# Patient Record
Sex: Female | Born: 1935 | Race: White | Hispanic: No | Smoking: Former smoker
Health system: Southern US, Community
[De-identification: ages and names within clinical notes are randomized; demographics above are authoritative.]

## PROBLEM LIST (undated history)

## (undated) DIAGNOSIS — K648 Other hemorrhoids: Secondary | ICD-10-CM

## (undated) DIAGNOSIS — F32A Depression, unspecified: Secondary | ICD-10-CM

## (undated) DIAGNOSIS — R519 Headache, unspecified: Secondary | ICD-10-CM

## (undated) DIAGNOSIS — F419 Anxiety disorder, unspecified: Secondary | ICD-10-CM

## (undated) DIAGNOSIS — E079 Disorder of thyroid, unspecified: Secondary | ICD-10-CM

## (undated) DIAGNOSIS — M109 Gout, unspecified: Secondary | ICD-10-CM

## (undated) DIAGNOSIS — M1990 Unspecified osteoarthritis, unspecified site: Secondary | ICD-10-CM

## (undated) DIAGNOSIS — N189 Chronic kidney disease, unspecified: Secondary | ICD-10-CM

## (undated) DIAGNOSIS — R202 Paresthesia of skin: Secondary | ICD-10-CM

## (undated) DIAGNOSIS — I739 Peripheral vascular disease, unspecified: Secondary | ICD-10-CM

## (undated) DIAGNOSIS — Z9889 Other specified postprocedural states: Secondary | ICD-10-CM

## (undated) DIAGNOSIS — R1310 Dysphagia, unspecified: Secondary | ICD-10-CM

## (undated) DIAGNOSIS — H269 Unspecified cataract: Secondary | ICD-10-CM

## (undated) DIAGNOSIS — I10 Essential (primary) hypertension: Secondary | ICD-10-CM

## (undated) DIAGNOSIS — I5030 Unspecified diastolic (congestive) heart failure: Secondary | ICD-10-CM

## (undated) DIAGNOSIS — I2510 Atherosclerotic heart disease of native coronary artery without angina pectoris: Secondary | ICD-10-CM

## (undated) DIAGNOSIS — Z862 Personal history of diseases of the blood and blood-forming organs and certain disorders involving the immune mechanism: Secondary | ICD-10-CM

## (undated) DIAGNOSIS — I499 Cardiac arrhythmia, unspecified: Secondary | ICD-10-CM

## (undated) DIAGNOSIS — I4891 Unspecified atrial fibrillation: Secondary | ICD-10-CM

## (undated) DIAGNOSIS — I219 Acute myocardial infarction, unspecified: Secondary | ICD-10-CM

## (undated) DIAGNOSIS — G4730 Sleep apnea, unspecified: Secondary | ICD-10-CM

## (undated) DIAGNOSIS — M719 Bursopathy, unspecified: Secondary | ICD-10-CM

## (undated) DIAGNOSIS — R51 Headache: Secondary | ICD-10-CM

## (undated) DIAGNOSIS — J189 Pneumonia, unspecified organism: Secondary | ICD-10-CM

## (undated) DIAGNOSIS — F329 Major depressive disorder, single episode, unspecified: Secondary | ICD-10-CM

## (undated) DIAGNOSIS — R0600 Dyspnea, unspecified: Secondary | ICD-10-CM

## (undated) DIAGNOSIS — F410 Panic disorder [episodic paroxysmal anxiety] without agoraphobia: Secondary | ICD-10-CM

## (undated) DIAGNOSIS — I509 Heart failure, unspecified: Secondary | ICD-10-CM

## (undated) DIAGNOSIS — E785 Hyperlipidemia, unspecified: Secondary | ICD-10-CM

## (undated) DIAGNOSIS — J449 Chronic obstructive pulmonary disease, unspecified: Secondary | ICD-10-CM

## (undated) DIAGNOSIS — K52839 Microscopic colitis, unspecified: Secondary | ICD-10-CM

## (undated) DIAGNOSIS — K219 Gastro-esophageal reflux disease without esophagitis: Secondary | ICD-10-CM

## (undated) HISTORY — DX: Chronic obstructive pulmonary disease, unspecified: J44.9

## (undated) HISTORY — PX: OTHER SURGICAL HISTORY: SHX169

## (undated) HISTORY — DX: Bursopathy, unspecified: M71.9

## (undated) HISTORY — DX: Gastro-esophageal reflux disease without esophagitis: K21.9

## (undated) HISTORY — DX: Microscopic colitis, unspecified: K52.839

## (undated) HISTORY — DX: Chronic kidney disease, unspecified: N18.9

## (undated) HISTORY — PX: SHOULDER SURGERY: SHX246

## (undated) HISTORY — PX: ABDOMINAL HYSTERECTOMY: SHX81

## (undated) HISTORY — DX: Hyperlipidemia, unspecified: E78.5

## (undated) HISTORY — DX: Acute myocardial infarction, unspecified: I21.9

## (undated) HISTORY — PX: CARDIAC CATHETERIZATION: SHX172

## (undated) HISTORY — DX: Other hemorrhoids: K64.8

## (undated) HISTORY — PX: ANTERIOR AND POSTERIOR REPAIR: SHX1172

## (undated) HISTORY — DX: Atherosclerotic heart disease of native coronary artery without angina pectoris: I25.10

## (undated) HISTORY — PX: NASAL SINUS SURGERY: SHX719

## (undated) HISTORY — DX: Panic disorder (episodic paroxysmal anxiety): F41.0

## (undated) HISTORY — DX: Disorder of thyroid, unspecified: E07.9

## (undated) HISTORY — DX: Peripheral vascular disease, unspecified: I73.9

## (undated) HISTORY — PX: CORONARY ANGIOPLASTY WITH STENT PLACEMENT: SHX49

## (undated) HISTORY — DX: Dysphagia, unspecified: R13.10

## (undated) HISTORY — DX: Heart failure, unspecified: I50.9

## (undated) HISTORY — DX: Other specified postprocedural states: Z98.890

## (undated) HISTORY — PX: BACK SURGERY: SHX140

## (undated) HISTORY — PX: APPENDECTOMY: SHX54

## (undated) HISTORY — PX: TONSILLECTOMY: SHX5217

---

## 1970-04-17 HISTORY — PX: OTHER SURGICAL HISTORY: SHX169

## 1987-04-18 HISTORY — PX: CARPAL TUNNEL RELEASE: SHX101

## 1996-04-17 HISTORY — PX: BREAST LUMPECTOMY: SHX2

## 1996-04-17 HISTORY — PX: CHOLECYSTECTOMY: SHX55

## 2001-04-17 DIAGNOSIS — K52839 Microscopic colitis, unspecified: Secondary | ICD-10-CM

## 2001-04-17 HISTORY — DX: Microscopic colitis, unspecified: K52.839

## 2001-09-27 ENCOUNTER — Ambulatory Visit (HOSPITAL_COMMUNITY): Admission: RE | Admit: 2001-09-27 | Discharge: 2001-09-27 | Payer: Self-pay | Admitting: Internal Medicine

## 2001-09-27 DIAGNOSIS — Z9889 Other specified postprocedural states: Secondary | ICD-10-CM

## 2001-09-27 HISTORY — DX: Other specified postprocedural states: Z98.890

## 2003-01-27 ENCOUNTER — Ambulatory Visit (HOSPITAL_COMMUNITY): Admission: RE | Admit: 2003-01-27 | Discharge: 2003-01-27 | Payer: Self-pay | Admitting: Internal Medicine

## 2004-01-06 ENCOUNTER — Ambulatory Visit (HOSPITAL_COMMUNITY): Admission: RE | Admit: 2004-01-06 | Discharge: 2004-01-06 | Payer: Self-pay | Admitting: Internal Medicine

## 2004-04-17 DIAGNOSIS — I219 Acute myocardial infarction, unspecified: Secondary | ICD-10-CM

## 2004-04-17 HISTORY — DX: Acute myocardial infarction, unspecified: I21.9

## 2004-08-18 ENCOUNTER — Ambulatory Visit: Payer: Self-pay | Admitting: Cardiology

## 2004-08-19 ENCOUNTER — Ambulatory Visit (HOSPITAL_COMMUNITY): Admission: RE | Admit: 2004-08-19 | Discharge: 2004-08-19 | Payer: Self-pay | Admitting: *Deleted

## 2004-08-19 ENCOUNTER — Ambulatory Visit: Payer: Self-pay | Admitting: Internal Medicine

## 2004-09-01 ENCOUNTER — Ambulatory Visit: Payer: Self-pay | Admitting: Cardiology

## 2004-10-05 ENCOUNTER — Ambulatory Visit: Payer: Self-pay | Admitting: Cardiology

## 2004-10-11 ENCOUNTER — Ambulatory Visit: Payer: Self-pay | Admitting: Cardiology

## 2005-02-01 ENCOUNTER — Ambulatory Visit: Payer: Self-pay | Admitting: Cardiology

## 2005-04-17 HISTORY — PX: JOINT REPLACEMENT: SHX530

## 2005-04-17 HISTORY — PX: OTHER SURGICAL HISTORY: SHX169

## 2005-08-28 ENCOUNTER — Ambulatory Visit: Payer: Self-pay | Admitting: Cardiology

## 2005-09-01 ENCOUNTER — Ambulatory Visit: Payer: Self-pay | Admitting: Internal Medicine

## 2005-09-01 ENCOUNTER — Inpatient Hospital Stay (HOSPITAL_BASED_OUTPATIENT_CLINIC_OR_DEPARTMENT_OTHER): Admission: RE | Admit: 2005-09-01 | Discharge: 2005-09-01 | Payer: Self-pay | Admitting: Internal Medicine

## 2005-10-27 ENCOUNTER — Ambulatory Visit: Payer: Self-pay | Admitting: Cardiology

## 2005-10-30 ENCOUNTER — Ambulatory Visit: Payer: Self-pay | Admitting: Cardiology

## 2006-02-23 ENCOUNTER — Ambulatory Visit: Payer: Self-pay | Admitting: Cardiology

## 2006-03-27 ENCOUNTER — Ambulatory Visit: Payer: Self-pay | Admitting: Cardiology

## 2006-03-28 ENCOUNTER — Inpatient Hospital Stay (HOSPITAL_COMMUNITY): Admission: RE | Admit: 2006-03-28 | Discharge: 2006-04-01 | Payer: Self-pay | Admitting: Orthopedic Surgery

## 2006-08-03 ENCOUNTER — Ambulatory Visit: Payer: Self-pay | Admitting: Cardiology

## 2006-08-16 ENCOUNTER — Ambulatory Visit: Payer: Self-pay | Admitting: Cardiology

## 2006-09-19 ENCOUNTER — Ambulatory Visit: Payer: Self-pay | Admitting: Cardiology

## 2006-09-21 ENCOUNTER — Ambulatory Visit: Payer: Self-pay | Admitting: Cardiology

## 2006-09-26 ENCOUNTER — Ambulatory Visit (HOSPITAL_COMMUNITY): Admission: RE | Admit: 2006-09-26 | Discharge: 2006-09-26 | Payer: Self-pay | Admitting: *Deleted

## 2007-01-15 ENCOUNTER — Ambulatory Visit: Payer: Self-pay | Admitting: Internal Medicine

## 2007-01-16 ENCOUNTER — Ambulatory Visit (HOSPITAL_COMMUNITY): Admission: RE | Admit: 2007-01-16 | Discharge: 2007-01-16 | Payer: Self-pay | Admitting: Gastroenterology

## 2007-01-25 ENCOUNTER — Ambulatory Visit: Payer: Self-pay | Admitting: Cardiology

## 2007-01-25 ENCOUNTER — Inpatient Hospital Stay (HOSPITAL_COMMUNITY): Admission: AD | Admit: 2007-01-25 | Discharge: 2007-01-29 | Payer: Self-pay | Admitting: Cardiology

## 2007-02-15 ENCOUNTER — Ambulatory Visit: Payer: Self-pay | Admitting: Cardiology

## 2007-05-14 ENCOUNTER — Ambulatory Visit: Payer: Self-pay | Admitting: Cardiology

## 2007-06-19 ENCOUNTER — Ambulatory Visit: Payer: Self-pay | Admitting: Internal Medicine

## 2007-08-30 ENCOUNTER — Ambulatory Visit: Payer: Self-pay | Admitting: Cardiology

## 2007-12-03 ENCOUNTER — Encounter: Payer: Self-pay | Admitting: Cardiology

## 2007-12-12 ENCOUNTER — Ambulatory Visit: Payer: Self-pay | Admitting: Cardiology

## 2008-02-28 DIAGNOSIS — K219 Gastro-esophageal reflux disease without esophagitis: Secondary | ICD-10-CM | POA: Insufficient documentation

## 2008-02-28 DIAGNOSIS — I219 Acute myocardial infarction, unspecified: Secondary | ICD-10-CM | POA: Insufficient documentation

## 2008-02-28 DIAGNOSIS — F410 Panic disorder [episodic paroxysmal anxiety] without agoraphobia: Secondary | ICD-10-CM | POA: Insufficient documentation

## 2008-02-28 DIAGNOSIS — K5289 Other specified noninfective gastroenteritis and colitis: Secondary | ICD-10-CM | POA: Insufficient documentation

## 2008-02-28 DIAGNOSIS — Z8679 Personal history of other diseases of the circulatory system: Secondary | ICD-10-CM

## 2008-02-28 DIAGNOSIS — K648 Other hemorrhoids: Secondary | ICD-10-CM

## 2008-02-28 DIAGNOSIS — R1310 Dysphagia, unspecified: Secondary | ICD-10-CM

## 2008-10-13 ENCOUNTER — Encounter: Payer: Self-pay | Admitting: Cardiology

## 2008-10-13 ENCOUNTER — Encounter: Payer: Self-pay | Admitting: Physician Assistant

## 2008-10-13 ENCOUNTER — Ambulatory Visit: Payer: Self-pay | Admitting: Cardiology

## 2008-10-13 ENCOUNTER — Inpatient Hospital Stay (HOSPITAL_COMMUNITY): Admission: EM | Admit: 2008-10-13 | Discharge: 2008-10-16 | Payer: Self-pay | Admitting: Cardiology

## 2008-10-13 ENCOUNTER — Ambulatory Visit: Payer: Self-pay | Admitting: Cardiovascular Disease

## 2008-10-14 ENCOUNTER — Encounter: Payer: Self-pay | Admitting: Cardiology

## 2008-10-15 ENCOUNTER — Encounter: Payer: Self-pay | Admitting: Cardiology

## 2008-10-16 ENCOUNTER — Encounter: Payer: Self-pay | Admitting: Cardiovascular Disease

## 2008-11-27 DIAGNOSIS — I2510 Atherosclerotic heart disease of native coronary artery without angina pectoris: Secondary | ICD-10-CM | POA: Insufficient documentation

## 2008-11-27 DIAGNOSIS — E785 Hyperlipidemia, unspecified: Secondary | ICD-10-CM

## 2008-11-27 DIAGNOSIS — I739 Peripheral vascular disease, unspecified: Secondary | ICD-10-CM

## 2008-11-30 ENCOUNTER — Ambulatory Visit: Payer: Self-pay | Admitting: Cardiology

## 2008-11-30 ENCOUNTER — Encounter: Payer: Self-pay | Admitting: Cardiology

## 2008-11-30 DIAGNOSIS — I5032 Chronic diastolic (congestive) heart failure: Secondary | ICD-10-CM

## 2008-11-30 DIAGNOSIS — R0602 Shortness of breath: Secondary | ICD-10-CM | POA: Insufficient documentation

## 2008-12-08 ENCOUNTER — Encounter: Payer: Self-pay | Admitting: Cardiology

## 2008-12-16 ENCOUNTER — Telehealth: Payer: Self-pay | Admitting: Cardiology

## 2008-12-16 ENCOUNTER — Encounter (INDEPENDENT_AMBULATORY_CARE_PROVIDER_SITE_OTHER): Payer: Self-pay | Admitting: *Deleted

## 2009-04-13 ENCOUNTER — Ambulatory Visit: Payer: Self-pay | Admitting: Cardiology

## 2009-04-13 DIAGNOSIS — R5383 Other fatigue: Secondary | ICD-10-CM

## 2009-04-13 DIAGNOSIS — R5381 Other malaise: Secondary | ICD-10-CM | POA: Insufficient documentation

## 2009-05-17 ENCOUNTER — Ambulatory Visit: Payer: Self-pay | Admitting: Cardiology

## 2009-05-17 ENCOUNTER — Encounter: Payer: Self-pay | Admitting: Physician Assistant

## 2009-05-26 ENCOUNTER — Encounter: Payer: Self-pay | Admitting: Cardiology

## 2009-06-04 ENCOUNTER — Encounter: Payer: Self-pay | Admitting: Cardiology

## 2009-06-07 ENCOUNTER — Telehealth: Payer: Self-pay | Admitting: Cardiology

## 2009-06-13 ENCOUNTER — Encounter: Payer: Self-pay | Admitting: Cardiology

## 2009-07-14 ENCOUNTER — Encounter: Payer: Self-pay | Admitting: Cardiology

## 2009-09-16 ENCOUNTER — Encounter: Payer: Self-pay | Admitting: Cardiology

## 2010-01-06 ENCOUNTER — Ambulatory Visit: Payer: Self-pay | Admitting: Cardiology

## 2010-05-17 NOTE — Assessment & Plan Note (Signed)
Summary: 41monthfollowup/rm  Medications Added TRAMADOL HCL 50 MG TABS (TRAMADOL HCL) Take 1 tablet by mouth two times a day as needed VITAMIN E 400 UNIT CAPS (VITAMIN E) Take 1 capsule by mouth once a day CENTRUM  TABS (MULTIPLE VITAMINS-MINERALS) Take 1 tablet by mouth once a day OXYBUTYNIN CHLORIDE 5 MG TABS (OXYBUTYNIN CHLORIDE) Take 1 tablet by mouth two times a day ALPRAZOLAM 0.25 MG TABS (ALPRAZOLAM) may take one tab two times a day to three times a day as needed anxiety        Visit Type:  Follow-up Primary Provider:  Dr. Consuello Masse  CC:  SOB and Diaphoresis.  History of Present Illness: the patient is a 77 year old female with history of nonobstructive coronary artery disease documented by multiple prior cardiac catheterizations.  She has normal LV systolic function.  She has a history of chronic dyspnea and has been evaluated by Dr. Koleen Nimrod.  She apparently has COPD.  She underwent a methacholine challenge test which was negative.  FEV1 was 1.85 L.  The patient complains of occasional left-sided pains associated with cold sweats and a feeling of panic.  She sometimes gets shaky.  Xanax he is to improve her symptoms.  She also reports right hand and right elbow as well as right shoulder pain with associated numbness.  There is no exertional chest pain however that she has exertional dyspnea.  EKG demonstrates no acute ischemia.  Preventive Screening-Counseling & Management  Alcohol-Tobacco     Smoking Status: quit  Comments: quit smoking 8 yrs ago. Started smoking at 77 yrs old  Current Medications (verified): 1)  Metoprolol Tartrate 50 Mg Tabs (Metoprolol Tartrate) .... Take 1/4 Tablet By Mouth Twice A Day 2)  Imdur 120 Mg Xr24h-Tab (Isosorbide Mononitrate) .... Take 1 Tablets By Mouth Once A Day 3)  Protonix 40 Mg Tbec (Pantoprazole Sodium) .... Take 1 Tablet By Mouth Once A Day 4)  Nitroglycerin 0.4 Mg Subl (Nitroglycerin) .... One Tablet Under Tongue Every 5 Minutes As  Needed For Chest Pain---May Repeat Times Three 5)  Cymbalta 60 Mg Cpep (Duloxetine Hcl) .... Once Daily 6)  Aspirin 81 Mg Tbec (Aspirin) .... Take One Tablet By Mouth Daily 7)  Cranberry Plus Vitamin C 140-100-3 Mg-Mg-Unit Caps (Cranberry-Vitamin C-Vitamin E) .... 2 Once Daily 8)  Fish Oil 1000 Mg Caps (Omega-3 Fatty Acids) .... Take 1 Tablet By Mouth Once A Day 9)  Furosemide 20 Mg Tabs (Furosemide) .... Take  1 Tablet By Mouth Daily. 10)  Imodium A-D 2 Mg Tabs (Loperamide Hcl) .... As Needed 11)  Tramadol Hcl 50 Mg Tabs (Tramadol Hcl) .... Take 1 Tablet By Mouth Two Times A Day As Needed 12)  Fluticasone Propionate 50 Mcg/act Susp (Fluticasone Propionate) .... 2 (19mcg) Puffs Two Times A Day 13)  Vitamin E 400 Unit Caps (Vitamin E) .... Take 1 Capsule By Mouth Once A Day 14)  Centrum  Tabs (Multiple Vitamins-Minerals) .... Take 1 Tablet By Mouth Once A Day 15)  Oxybutynin Chloride 5 Mg Tabs (Oxybutynin Chloride) .... Take 1 Tablet By Mouth Two Times A Day 16)  Alprazolam 0.25 Mg Tabs (Alprazolam) .... May Take One Tab Two Times A Day To Three Times A Day As Needed Anxiety  Allergies: 1)  ! Floxin 2)  ! Codeine 3)  ! Prednisone  Comments:  Nurse/Medical Assistant: The patient's medications were reviewed with the patient and were updated in the Medication List. Pt brought medication bottles to office visit.  Gurney Maxin, RN, BSN (January 06, 2010 10:33 AM)  Past History:  Past Medical History: PVD (ICD-443.9) HYPERLIPIDEMIA-MIXED (ICD-272.4) CAD, NATIVE VESSEL (ICD-414.01)nonobstructive coronary artery disease by multiple catheterizations. PANIC ATTACK (ICD-300.01) HYPERTENSION, HX OF (ICD-V12.50) MI (ICD-410.90) HEMORRHOIDS, INTERNAL (ICD-455.0) DYSPHAGIA UNSPECIFIED (ICD-787.20) COLITIS (ICD-558.9) GASTROESOPHAGEAL REFLUX DISEASE, CHRONIC (ICD-530.81) negative methacholine challenge test.  FEV1 1.85 L    Review of Systems       The patient complains of chest pain  and shortness of breath.  The patient denies fatigue, malaise, fever, weight gain/loss, vision loss, decreased hearing, hoarseness, palpitations, prolonged cough, wheezing, sleep apnea, coughing up blood, abdominal pain, blood in stool, nausea, vomiting, diarrhea, heartburn, incontinence, blood in urine, muscle weakness, joint pain, leg swelling, rash, skin lesions, headache, fainting, dizziness, depression, anxiety, enlarged lymph nodes, easy bruising or bleeding, and environmental allergies.    Vital Signs:  Patient profile:   77 year old female Height:      61 inches Weight:      177.50 pounds BMI:     33.66 Pulse rate:   64 / minute BP sitting:   128 / 75  (left arm) Cuff size:   large  Vitals Entered By: Gurney Maxin, RN, BSN (January 06, 2010 10:24 AM)  Nutrition Counseling: Patient's BMI is greater than 25 and therefore counseled on weight management options. CC: SOB and Diaphoresis Comments Pt states SOB and sweating for over 1 yr. She has seen Dr. Koleen Nimrod for a test for SOB. Pt states she stopped potassium own her own d/t upset stomach. She states when she gets the cramps she eats something with potassium in it that her stomach will take.   Physical Exam  Additional Exam:  General: Well-developed, well-nourished in no distress head: Normocephalic and atraumatic eyes PERRLA/EOMI intact, conjunctiva and lids normal nose: No deformity or lesions mouth normal dentition, normal posterior pharynx neck: Supple, no JVD.  No masses, thyromegaly or abnormal cervical nodes lungs: Normal breath sounds bilaterally without wheezing.  Normal percussion heart: regular rate and rhythm with normal S1 and S2, no S3 or S4.  PMI is normal.  No pathological murmurs abdomen: Normal bowel sounds, abdomen is soft and nontender without masses, organomegaly or hernias noted.  No hepatosplenomegaly musculoskeletal: Back normal, normal gait muscle strength and tone normal pulsus: Pulse is normal in  all 4 extremities Extremities: No peripheral pitting edema neurologic: Alert and oriented x 3 skin: Intact without lesions or rashes cervical nodes: No significant adenopathy psychologic: Normal affect    EKG  Procedure date:  01/06/2010  Findings:      normal sinus rhythm with short PR.  Low-voltage QRS.  Septal infarct pattern.  Heart rate 63 beats/min  Impression & Recommendations:  Problem # 1:  CAD, NATIVE VESSEL (ICD-414.01) atypical chest pain with nonobstructive coronary artery disease.  Continue medical therapy.  Suspect a large component of the patient's chest pain may be mediated by anxiety Her updated medication list for this problem includes:    Metoprolol Tartrate 50 Mg Tabs (Metoprolol tartrate) .[name] Kitchen... Take 1/4 tablet by mouth twice a day    Imdur 120 Mg Xr24h-tab (Isosorbide mononitrate) .[name] Kitchen... Take 1 tablets by mouth once a day    Nitroglycerin 0.4 Mg Subl (Nitroglycerin) ..... One tablet under tongue every 5 minutes as needed for chest pain---may repeat times three    Aspirin 81 Mg Tbec (Aspirin) .[name] Kitchen... Take one tablet by mouth daily  Orders: EKG w/ Interpretation (93000)  Problem # 2:  CHRONIC DIASTOLIC HEART FAILURE (0000000) no evidence of volume overload.  Continue medical therapy Her updated medication list for this problem includes:    Metoprolol Tartrate 50 Mg Tabs (Metoprolol tartrate) .[name] Kitchen... Take 1/4 tablet by mouth twice a day    Imdur 120 Mg Xr24h-tab (Isosorbide mononitrate) .[name] Kitchen... Take 1 tablets by mouth once a day    Nitroglycerin 0.4 Mg Subl (Nitroglycerin) ..... One tablet under tongue every 5 minutes as needed for chest pain---may repeat times three    Aspirin 81 Mg Tbec (Aspirin) .[name] Kitchen... Take one tablet by mouth daily    Furosemide 20 Mg Tabs (Furosemide) .[name] Kitchen... Take  1 tablet by mouth daily.  Problem # 3:  DYSPNEA (ICD-786.05) likely secondary to COPD, but negative methacholine challenge test.  Now followed by Dr. Koleen Nimrod. Her updated  medication list for this problem includes:    Metoprolol Tartrate 50 Mg Tabs (Metoprolol tartrate) .[name] Kitchen... Take 1/4 tablet by mouth twice a day    Aspirin 81 Mg Tbec (Aspirin) .[name] Kitchen... Take one tablet by mouth daily    Furosemide 20 Mg Tabs (Furosemide) .[name] Kitchen... Take  1 tablet by mouth daily.  Patient Instructions: 1)  Xanax 0.25mg  two times a day to three times a day as needed anxiety (30 days only, no refills) 2)  Follow up in  6 months Prescriptions: ALPRAZOLAM 0.25 MG TABS (ALPRAZOLAM) may take one tab two times a day to three times a day as needed anxiety  #30 x 0   Entered by:   Lovina Reach, LPN   Authorized by:   Terald Sleeper, MD, [facility]   Signed by:   Lovina Reach, LPN on [id number]   Method used:   Print then Give to Patient   RxID:   [id number]

## 2010-05-17 NOTE — Letter (Signed)
Summary: External Correspondence/ FAXED DR. HENDERSON  External Correspondence/ FAXED DR. HENDERSON   Imported By: Bartholomew Boards 06/18/2009 12:16:43  _____________________________________________________________________  External Attachment:    Type:   Image     Comment:   External Document

## 2010-05-17 NOTE — Miscellaneous (Signed)
Summary: Orders Update - Pulmonary Referral  Clinical Lists Changes  Orders: Added new Referral order of Misc. Referral (Misc. Ref) - Signed

## 2010-05-17 NOTE — Letter (Signed)
Summary: External Correspondence/ NOTE DR. HENDERSON  External Correspondence/ NOTE DR. HENDERSON   Imported By: Bartholomew Boards 07/06/2009 12:23:03  _____________________________________________________________________  External Attachment:    Type:   Image     Comment:   External Document

## 2010-05-17 NOTE — Assessment & Plan Note (Signed)
Summary: 1 MO FU REMINDER-SRS  Medications Added FLUTICASONE PROPIONATE 50 MCG/ACT SUSP (FLUTICASONE PROPIONATE) 2 (147mcg) puffs two times a day      Allergies Added:   Visit Type:  Follow-up Primary Provider:  Dr. Consuello Masse  CC:  follow-up visit.  History of Present Illness: the patient is a 77 year old female with history of nonobstructive coronary arteries by multiple prior cardiac catheterizations. The patient catheterization less than a year ago with normal LV function and postcatheterization perfusion imaging was also negative for ischemia. The patient continues to complain about dyspnea at rest and exertional dyspnea. He is also felt to have COPD and she reports at times wheezing. She reports swelling of the hands and has a possible diagnosis of chronic diastolic heart failure. However her last BNP level was within normal limits. The patient reports problems with anxiety. She has been unable to lose weight. She denies any popping PND palpitations or syncope.  Clinical Review Panels:  CXR CXR results Heart and mediastinal contours are within normal limits.         No focal opacities or effusions.  No acute bony abnormality.                   IMPRESSION:         No active disease. (10/13/2008)  Cardiac Imaging Cardiac Cath Findings  1.  Stable nonobstructive coronary disease without flow-limiting of lesions.      There is a moderate lesion in the proximal LAD but this is unchanged      since [id number].  2.  Low normal LV ejection fraction.  3.  Moderate peripheral arterial disease as described above.  4.  Significant discordance between the thermodilution and the Fick cardiac      output.  I suspect the thermodilution is probably more accurate; as I      suspect the oxygen consumption in the Fick equation may be off.  5.  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   PLAN:  At this point we will continue medical therapy.  If her chest pain  continues  would consider an adenosine Myoview to further evaluate the  hemodynamic significance of LAD lesion.   Glori Bickers, M.D. [facility] (09/01/2005)    Preventive Screening-Counseling & Management  Alcohol-Tobacco     Smoking Status: quit     Year Quit: 2004  Current Medications (verified): 1)  Metoprolol Tartrate 50 Mg Tabs (Metoprolol Tartrate) .... Take 1/4 Tablet By Mouth Twice A Day 2)  Imdur 120 Mg Xr24h-Tab (Isosorbide Mononitrate) .... Take 1 Tablets By Mouth Once A Day 3)  Protonix 40 Mg Tbec (Pantoprazole Sodium) .... Take 1 Tablet By Mouth Once A Day 4)  Nitroglycerin 0.4 Mg Subl (Nitroglycerin) .... One Tablet Under Tongue Every 5 Minutes As Needed For Chest Pain---May Repeat Times Three 5)  Cymbalta 60 Mg Cpep (Duloxetine Hcl) .... Once Daily 6)  Aspirin 81 Mg Tbec (Aspirin) .... Take One Tablet By Mouth Daily 7)  Calcium Carbonate-Vitamin D 600-400 Mg-Unit  Tabs (Calcium Carbonate-Vitamin D) .... Once Daily 8)  Cranberry Plus Vitamin C 140-100-3 Mg-Mg-Unit Caps (Cranberry-Vitamin C-Vitamin E) .... 2 Once Daily 9)  Fish Oil 1000 Mg Caps (Omega-3 Fatty Acids) .... Take 1 Tablet By Mouth Once A Day 10)  Furosemide 20 Mg Tabs (Furosemide) .... Take  1 Tablet By Mouth Daily. 11)  Potassium Chloride Crys Cr 20 Meq Cr-Tabs (Potassium Chloride Crys Cr) .... Take  1 Tab (44meq) Daily 12)  Imodium A-D  2 Mg Tabs (Loperamide Hcl) .... As Needed 13)  Tramadol Hcl 50 Mg Tabs (Tramadol Hcl) .... Take 1 Tablet By Mouth Two Times A Day 14)  Probiotic  Caps (Probiotic Product) .... Take 1 Tablet By Mouth Once A Day 15)  Amoxicillin 500 Mg Caps (Amoxicillin) .... Take 1 Tablet By Mouth Two Times A Day For 0 Days 16)  Fluticasone Propionate 50 Mcg/act Susp (Fluticasone Propionate) .... 2 (133mcg) Puffs Two Times A Day  Allergies (verified): 1)  ! Floxin 2)  ! Codeine 3)  ! Prednisone  Comments:  Nurse/Medical Assistant: The patient's medications and allergies were reviewed with the patient  and were updated in the Medication and Allergy Lists. Bottles reviewed.  Past History:  Past Medical History: Last updated: 11/27/2008 PVD (ICD-443.9) HYPERLIPIDEMIA-MIXED (ICD-272.4) CAD, NATIVE VESSEL (ICD-414.01) PANIC ATTACK (ICD-300.01) HYPERTENSION, HX OF (ICD-V12.50) MI (ICD-410.90) HEMORRHOIDS, INTERNAL (ICD-455.0) DYSPHAGIA UNSPECIFIED (ICD-787.20) COLITIS (ICD-558.9) GASTROESOPHAGEAL REFLUX DISEASE, CHRONIC (ICD-530.81)    Past Surgical History: Last updated: 02/28/2008 Tonsilectomy Hysterectomy Cholecystectomy Right knee replacement Right leg benign tumor Left breast ( lumpectomy) Left hand surgery Left rotator cuff Sinus surgery  Family History: Last updated: 11/27/2008 Family History of Coronary Artery Disease:  Family History of CVA or Stroke:  Family History of Hypertension:   Social History: Last updated: 11/27/2008 Full Time Divorced  Tobacco Use - No.  Alcohol Use - no  Risk Factors: Smoking Status: quit (05/17/2009)  Review of Systems       The patient complains of weight gain/loss, shortness of breath, and anxiety.  The patient denies fatigue, malaise, fever, vision loss, decreased hearing, hoarseness, chest pain, palpitations, prolonged cough, wheezing, sleep apnea, coughing up blood, abdominal pain, blood in stool, nausea, vomiting, diarrhea, heartburn, incontinence, blood in urine, muscle weakness, joint pain, leg swelling, rash, skin lesions, headache, fainting, dizziness, depression, enlarged lymph nodes, easy bruising or bleeding, and environmental allergies.    Vital Signs:  Patient profile:   77 year old female Height:      61 inches Weight:      186 pounds Pulse rate:   56 / minute BP sitting:   145 / 76  (left arm) Cuff size:   large  Vitals Entered By: Georgina Peer (May 17, 2009 1:15 PM) CC: follow-up visit   Physical Exam  Additional Exam:  General: Well-developed, well-nourished in no distress head: Normocephalic  and atraumatic eyes PERRLA/EOMI intact, conjunctiva and lids normal nose: No deformity or lesions mouth normal dentition, normal posterior pharynx neck: Supple, no JVD.  No masses, thyromegaly or abnormal cervical nodes lungs: Normal breath sounds bilaterally without wheezing.  Normal percussion heart: regular rate and rhythm with normal S1 and S2, no S3 or S4.  PMI is normal.  No pathological murmurs abdomen: Normal bowel sounds, abdomen is soft and nontender without masses, organomegaly or hernias noted.  No hepatosplenomegaly musculoskeletal: Back normal, normal gait muscle strength and tone normal pulsus: Pulse is normal in all 4 extremities Extremities: No peripheral pitting edema neurologic: Alert and oriented x 3 skin: Intact without lesions or rashes cervical nodes: No significant adenopathy psychologic: Normal affect    Impression & Recommendations:  Problem # 1:  DYSPNEA (ICD-786.05) the patient has multifactorial dyspnea. There is no definite evidence of acute diastolic heart failure. Her BNP level was previously normal. Make no changes in her diuretic regimen. I did order pulmonary function test with DLCO as well as an ABG.I also started patient on fluticasone inhaler at 100 micrograms b.i.d. Her updated medication list  for this problem includes:    Metoprolol Tartrate 50 Mg Tabs (Metoprolol tartrate) .[name] Kitchen... Take 1/4 tablet by mouth twice a day    Aspirin 81 Mg Tbec (Aspirin) .[name] Kitchen... Take one tablet by mouth daily    Furosemide 20 Mg Tabs (Furosemide) .[name] Kitchen... Take  1 tablet by mouth daily.  Orders: Pulmonary Function Test (PFT)  Problem # 2:  CHRONIC DIASTOLIC HEART FAILURE (0000000) no evidence of volume overload. As outlined above, BNP level was within normal limits her last office visit. Her updated medication list for this problem includes:    Metoprolol Tartrate 50 Mg Tabs (Metoprolol tartrate) .[name] Kitchen... Take 1/4 tablet by mouth twice a day    Imdur 120 Mg Xr24h-tab  (Isosorbide mononitrate) .[name] Kitchen... Take 1 tablets by mouth once a day    Nitroglycerin 0.4 Mg Subl (Nitroglycerin) ..... One tablet under tongue every 5 minutes as needed for chest pain---may repeat times three    Aspirin 81 Mg Tbec (Aspirin) .[name] Kitchen... Take one tablet by mouth daily    Furosemide 20 Mg Tabs (Furosemide) .[name] Kitchen... Take  1 tablet by mouth daily.  Problem # 3:  HYPERLIPIDEMIA-MIXED (B2193296.4) lipid panel will be followed by her primary care physician.  Problem # 4:  CAD, NATIVE VESSEL (ICD-414.01) the patient reports no chest pain. The catheterization was only approximately 8 months ago and was followed by a normal perfusion study. She has stable nonobstructive coronary artery disease. Her updated medication list for this problem includes:    Metoprolol Tartrate 50 Mg Tabs (Metoprolol tartrate) .[name] Kitchen... Take 1/4 tablet by mouth twice a day    Imdur 120 Mg Xr24h-tab (Isosorbide mononitrate) .[name] Kitchen... Take 1 tablets by mouth once a day    Nitroglycerin 0.4 Mg Subl (Nitroglycerin) ..... One tablet under tongue every 5 minutes as needed for chest pain---may repeat times three    Aspirin 81 Mg Tbec (Aspirin) .[name] Kitchen... Take one tablet by mouth daily  Patient Instructions: 1)  Pulmonary Function Testing 2)  Follow up in  6 months Prescriptions: FLUTICASONE PROPIONATE 50 MCG/ACT SUSP (FLUTICASONE PROPIONATE) 2 (14mcg) puffs two times a day  #1 x 1   Entered by:   Lovina Reach, LPN   Authorized by:   Terald Sleeper, MD, [facility]   Signed by:   Lovina Reach, LPN on [id number]   Method used:   Electronically to        [facility] (retail)       [address]       [place], [place]  [address]       Ph: [phone] or [phone]       Fax: [phone]   RxID:   [id number]

## 2010-05-17 NOTE — Progress Notes (Signed)
Summary: PHONE; SURGERY CLEARANCE   Phone Note From Other Clinic   Caller: [place] OFFICE EXT [phone] Request: Talk with Provider Summary of Call: Mrs. 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 Hulda Humphrey. Patient was seen by you twice in the last two months. Dr. Barrie Dunker is wanting to know if you need to see her again or can she be cleared.  Dr. Dale  office Ext # [phone]   Initial call taken by: Delfino Lovett,  June 07, 2009 4:10 PM  Follow-up for Phone Call        Patient is cleared for surgery from a cardiovascular standpoint. She can proceed with surgery.  Follow-up by: Terald Sleeper, MD, [facility],  June 07, 2009 8:16 PM  Additional Follow-up for Phone Call Additional follow up Details #1::        Heather notified.  Will fax info to [phone]. Lovina Reach, LPN  February 22, 624THL 10:31 AM

## 2010-06-16 HISTORY — PX: OTHER SURGICAL HISTORY: SHX169

## 2010-07-24 LAB — HEPARIN LEVEL (UNFRACTIONATED): Heparin Unfractionated: <0.1 IU/mL — ABNORMAL LOW (ref 0.30–0.70)

## 2010-07-24 LAB — CBC
HCT: 35.4 % — ABNORMAL LOW (ref 36.0–46.0)
MCHC: 34.6 g/dL (ref 30.0–36.0)
MCV: 89.2 fL (ref 78.0–100.0)
Platelets: 199 10*3/uL (ref 150–400)

## 2010-07-25 LAB — BASIC METABOLIC PANEL
Calcium: 8.6 mg/dL (ref 8.4–10.5)
Chloride: 107 mEq/L (ref 96–112)
GFR calc non Af Amer: 60 mL/min — ABNORMAL LOW (ref >60)
Potassium: 4 mEq/L (ref 3.5–5.1)
Sodium: 140 mEq/L (ref 135–145)

## 2010-07-25 LAB — CARDIAC PANEL(CRET KIN+CKTOT+MB+TROPI)
Relative Index: INVALID (ref 0.0–2.5)
Relative Index: INVALID (ref 0.0–2.5)
Total CK: 71 U/L (ref 7–177)
Total CK: 73 U/L (ref 7–177)
Troponin I: 0.02 ng/mL (ref 0.00–0.06)

## 2010-07-25 LAB — CBC
MCHC: 33.9 g/dL (ref 30.0–36.0)
RDW: 14.2 % (ref 11.5–15.5)

## 2010-07-25 LAB — HEPARIN LEVEL (UNFRACTIONATED): Heparin Unfractionated: 0.25 IU/mL — ABNORMAL LOW (ref 0.30–0.70)

## 2010-07-25 LAB — LIPID PANEL
Cholesterol: 187 mg/dL (ref 0–200)
HDL: 40 mg/dL (ref >39)
LDL Cholesterol: 118 mg/dL — ABNORMAL HIGH (ref 0–99)
Triglycerides: 147 mg/dL (ref <150)

## 2010-08-30 NOTE — Assessment & Plan Note (Signed)
[facility] OFFICE NOTE   [name], [name]                       MRN:          [id number]  DATE:05/14/2007                            DOB:          1932/08/05    REFERRING PHYSICIAN:  Consuello Masse, M.D.   HISTORY OF PRESENT ILLNESS:  The patient is an elderly female with  history of known obstructive coronary artery disease.  From a cardiac  standpoint, she is actually quite well.  She reports no chest pain.  She  is a NYHA class 2 and is deconditioned.  However, she does not appear to  have symptoms of an angina equivalent.  Her main complaint now is severe  cramps that she has in both upper and lower extremities.  __________  have known associated with muscle weakness or sensory changes.  She  stated she went to [facility] to have this evaluated.  Blood work was  done.  We will try to get a copy of this, but apparently blood work was  within normal limits, although I am not sure what exactly was performed.  The patient states that when she has these cramping spells, it feels  worse than labor pain and causes her to stop in her tracks.  It does  seem to happen more frequently at night.  She also relates that this may  be related to Crestor, which was started on the last office visit here.  She has now stopped Crestor for four days with some moderate improvement  in her symptomatology.   MEDICATIONS:  1. Aspirin 81 mg p.o. dairy.  2. Cymbalta 30 mg p.o. daily.  3. Pantoprazole 40 mg p.o. daily.  4. Metoprolol 50 mg 1/2 tablet p.o. daily.  5. Crestor is on hold.  6. Isosorbide 120 mg p.o. daily.  7. Ceftin 250 mg p.o. b.i.d.  __________  was just recently prescribed      for bronchitis.   PHYSICAL EXAMINATION:  VITAL SIGNS:  Blood pressure 160/85.  Heart rate:  56.  Weight:  169 pounds.  NECK:  Normal carotid upstroke.  No bruits.  LUNGS:  Clear breath sounds bilaterally.  HEART:  Regular rate and  rhythm.  Normal S1, S2.  No murmurs, rubs or  gallops.  ABDOMEN:  Soft and nontender.  No rebound or guarding.  Good bowel  sounds.  EXTREMITY EXAM:  No cyanosis, clubbing or edema.   PROBLEMS:  1. Severe upper and lower extremity cramps.      a.     Rule out related to Crestor.      b.     Rule out rhabdomyolysis.  2. Gastroesophageal reflux disease.  See details prior note.  3. Dyslipidemia.  Crestor stopped secondary to #1.  4. History of tobacco use.  Stopped six years ago.  5. Hypertension.  6. Peripheral vascular disease with moderate diffuse __________  iliac      disease by catheterization May of 2007.  7. Pain bilateral __________  arteries.   PLAN:  1. We will try to get a copy of her blood work done  at [facility].  I      am sure a CK level and potassium level as well as magnesium level      was done.  If not, we will make sure we get a SED rate, a CK and an      aldolase level.  2. In the interim I will treat the patient symptomatically and I did      ask her to stop her Crestor.  It may take actually several weeks      for her symptomatology to improve if it was related to Crestor.  I      also gave her a prescription for Valium 2.5 mg p.o. at night,      particularly as her night cramps are quite severe.  3. The patient can follow up with [place] in six months.     Ernestine Mcmurray, MD,FACC  Electronically Signed    GED/MedQ  DD: 05/14/2007  DT: 05/14/2007  Job #: [id number]   cc:   Consuello Masse

## 2010-08-30 NOTE — Consult Note (Signed)
[name], CANOY NO.:  [id number]   MEDICAL RECORD NO.:  [id number]          PATIENT TYPE:  INP   LOCATION:  [id number]                         FACILITY:  [facility]   PHYSICIAN:  Minus Breeding, MD, FACCDATE OF BIRTH:  August 11, 1932   DATE OF CONSULTATION:  01/28/2007  DATE OF DISCHARGE:                                 CONSULTATION   PROCEDURE:  Left heart catheterization/coronary   Dictation ended at this point.      Minus Breeding, MD, [facility]     JH/MEDQ  D:  01/28/2007  T:  01/28/2007  Job:  [id number]

## 2010-08-30 NOTE — Consult Note (Signed)
NAMECHARLESTYN, VIRAMONTES                ACCOUNT NO.:  [id number]   MEDICAL RECORD NO.:  [id number]          PATIENT TYPE:  INP   LOCATION:  [id number]                         FACILITY:  [facility]   PHYSICIAN:  Minus Breeding, MD, FACCDATE OF BIRTH:  09-Jul-1932   DATE OF CONSULTATION:  01/28/2007  DATE OF DISCHARGE:                                 CONSULTATION   PRIMARY CARE PHYSICIAN:  Dr. Quintin Alto.   CARDIOLOGIST:  Dr. Dannielle Burn.   PROCEDURE:  Left heart catheterization/coronary arteriography.   INDICATIONS:  Evaluate patient with chest discomfort and previous  coronary disease.   DESCRIPTION OF PROCEDURE:  Left heart catheterization was performed via  the right femoral artery.  I initially cannulated the vessel with a  regular needle but was unable to thread the wire. I subsequently had to  use a Smart needle. A #6-French arterial sheath was inserted via the  modified Seldinger technique. Preformed Judkins and a pigtail catheter  were utilized.  The patient tolerated the procedure well and left the  lab in stable condition.   RESULTS:  HEMODYNAMICS:  LV 133/10, AO 126/86.  Coronaries:  The left  main was normal.  The LAD had a proximal 60% stenosis. It was calcified.  There was a mid long 40-50% stenosis. The first diagonal was moderate  size with luminal irregularities.  The circumflex was dominant.  There  was proximal 25% stenosis. There was mid long 60-70% stenosis before the  mid obtuse marginal.  There was 80% stenosis after the mid obtuse  marginal.  The first obtuse marginal was small and normal. The second  obtuse marginal had proximal 60% stenosis.  The PDA had a mid 80%  stenosis (it was a small vessel), the right coronary artery is  nondominant.  There was proximal 60% followed by mid 70-80% stenosis.   LEFT VENTRICULOGRAM:  The left ventriculogram was obtained in the RAO  projection.  The EF was 65%.   CONCLUSION:  Nonobstructive coronary artery disease.  Normal ejection  fraction.  Of note, I reviewed the previous films from 2006 and  demonstrated no change from previous.   PLAN:  The patient will continue to have aggressive medical management.      Minus Breeding, MD, [facility]  Electronically Signed     JH/MEDQ  D:  01/28/2007  T:  01/29/2007  Job:  [id number]   cc:   Larey Brick, MD,FACC

## 2010-08-30 NOTE — Cardiovascular Report (Signed)
[name], [name]                ACCOUNT NO.:  [id number]   MEDICAL RECORD NO.:  [id number]          PATIENT TYPE:  INP   LOCATION:  3736                         FACILITY:  [facility]   PHYSICIAN:  Wallis Bamberg. Johnsie Cancel, MD, FACCDATE OF BIRTH:  April 25, 1932   DATE OF PROCEDURE:  10/14/2008  DATE OF DISCHARGE:  10/16/2008                            CARDIAC CATHETERIZATION   Cine catheterization was done from the right femoral artery.  JL-4 and  JR-4 catheters were used.  Left main coronary artery was calcified  proximally.  There was a 30% discrete stenosis.   The LAD was also calcified in its proximal mid vessel.  There was 50-60%  proximal disease.  It was a small vessel distally.  First diagonal  branch was normal.  Circumflex coronary artery was large and left  dominant.  There was a 30% proximal lesion.  There was a 60% lesion in  the midvessel after the first obtuse marginal branch.  The left-sided  PDA had a 40% midvessel lesion.   The right coronary artery was small and nondominant with 40-50% disease  in the proximal and midvessel.   RAO ventriculography:  RAO ventriculography was normal, EF was 60%.  Aortic pressure was 160/70, LV pressure was 160/90.   IMPRESSION:  The patient has moderate left anterior descending and  circumflex disease.  There is no change from the cath reviewed from  2008.  I do not think the circ in particular is flow limiting.  The  patient will have an adenosine Myoview study in the morning.  If there  is no ischemia, he will be discharged home with medical therapy.      Wallis Bamberg. Johnsie Cancel, MD, [facility]  Electronically Signed     PCN/MEDQ  D:  03/12/2009  T:  03/12/2009  Job:  [id number]

## 2010-08-30 NOTE — Discharge Summary (Signed)
NAMEGLENDA, FALLS                ACCOUNT NO.:  [id number]   MEDICAL RECORD NO.:  [id number]          PATIENT TYPE:  INP   LOCATION:  [id number]                         FACILITY:  [facility]   PHYSICIAN:  Wallis Bamberg. Johnsie Cancel, MD, FACCDATE OF BIRTH:  March 11, 1933   DATE OF ADMISSION:  01/25/2007  DATE OF DISCHARGE:  01/29/2007                               DISCHARGE SUMMARY   PROCEDURE:  1. Cardiac catheterization.  2. Coronary arteriogram.  3. Left ventriculogram.   PRIMARY FINAL DISCHARGE DIAGNOSIS:  Chest pain, nonobstructive coronary  artery at catheterization and medical therapy recommended.   SECONDARY DIAGNOSIS:  1. Allergy or intolerance to CODEINE, CIPRO PREDNISONE and FLOXIN.  2. History of palpitations with a negative CardioNet monitor.  3. Hypertension.  4. Hyperlipidemia.  5. Gastroesophageal reflux disease.  6. History of tobacco use.  7. Irritable bowel syndrome.  8. Depression.  9. Status post appendectomy, hysterectomy, left total knee      replacement, left breast lesion excision.  Laparoscopic      cholecystectomy, right carpal tunnel right shoulder surgery and      right leg benign tumor resection.   TIME AT DISCHARGE:  43 minutes.   HOSPITAL COURSE:  Ms. Elgart is a 77 year old female with a history of  nonobstructive coronary artery disease by catheterization in 2007.  She  went to [facility] for chest pain associated with  shortness of breath and diaphoresis.  She ruled out for MI and had a  negative D-dimer.  It was felt that she needed further evaluation and  repeat catheterization and she was transferred to [facility].   Cardiac catheterization was performed on January 28, 2007.  Her left  main was normal.  She had a proximal 60% calcified LAD and a 40%-50%  stenosis in the mid section of the LAD.  The LAD had moderate luminal  irregularities.  The circumflex was dominant and had a proximal 25% and  mid 60%-70% stenosis before the middle obtuse  marginal.  There was an  80% stenosis after the middle obtuse marginal.  The OM-1 was small and  normal.  The OM-2 had a proximal 60% lesion, and the PDA had a mid-80%  lesion, but was a small vessel.  The RCA was nondominant and had a  proximal 60% and a mid-70%-80% lesion.  Her EF was 65%.  The conclusion  was nonobstructive coronary artery disease, that was unchanged from  prior catheterization.  Medical therapy was recommended.  The importance  of controlling cardiac risk factors was emphasized.   On January 29, 2007 Ms. Donehoo cath site had no bruit.  She had no  further episodes of chest pain.  Her postprocedure labs were stable with  a creatinine of 0.9.  A TSH had been checked, and was within normal  limits at 1.978.  She was evaluated by Dr. Johnsie Cancel and considered stable  for discharge with outpatient follow up arranged.   DISCHARGE INSTRUCTIONS:  1. Her activity level is to be increased gradually.  2. She is not to do any lifting for week, and no driving for 2  days.  3. She is to call our office for any problems with the cath site.  4. She is to follow up with Dr. Dannielle Burn on October 31 at 1:30 and with      Dr. Quintin Alto as needed.   DISCHARGE MEDICATIONS:  1. Toprol XL 50 mg 1/2 tablet daily.  2. Protonix 40 mg a day.  3. Isosorbide mononitrate 120 mg a day.  4. Aspirin 81 mg a day.  5. Nitroglycerin sublingual p.r.n.  6. Cymbalta 30 mg a day.  7. Lomotil p.r.n.  8. Vitamins and calcium daily.      Rosaria Ferries, PA-C      Wallis Bamberg. Johnsie Cancel, MD, [facility]  Electronically Signed    RB/MEDQ  D:  01/29/2007  T:  01/30/2007  Job:  [id number]   cc:   Consuello Masse

## 2010-08-30 NOTE — Assessment & Plan Note (Signed)
[facility] OFFICE NOTE   [name], [name]                       MRN:          [id number]  DATE:02/15/2007                            DOB:          [birth year]    PRIMARY CARDIOLOGIST:  Ernestine Mcmurray, MD,FACC.   REASON FOR VISIT:  Post hospital followup.   [name] returns to the clinic after undergoing cardiac  catheterization at [facility], following an evaluation for  chest pain by [place] here at [facility].  She presented with known  history of nonobstructive coronary artery disease by previous  catheterizations, and with a history of possible coronary vasospasm  previously treated with Norvasc.  Of note, however, she was not on this  medication at the time of our recent evaluation.   Patient ruled out for myocardial infarction but had symptoms which were  quite concerning, and was thus transferred to [facility].  Cardiac  catheterization, however, showed continued moderate, nonobstructive CAD  notable for a 60% proximal, calcified LAD; long, mid 70% circumflex  stenosis followed by an 80% lesion more distally; 80% mid PDA (small);  and 60% proximal, 80% mid RCA (nondominant).  Left ventricular function  was normal (EF 65%).  Following review of previous films, Dr. Percival Spanish  concluded that there was no significant change.  Aggressive medical  therapy was recommended.   Patient reports no complications of right groin incision site.  She  seems to suggest that her symptoms are much less frequent.  Of note,  medications were not changed at the time of discharge.   Patient did have recent GI evaluation with a barium pill esophagram.  She reports to me today that this was apparently negative.  She does  have a history of previous esophageal dilatation, with a Schatzki's ring  noted in 2004.   Electrocardiogram today shows normal sinus rhythm at 73 beats per minute  with left axis  deviation and poor R wave progression.   MEDICATIONS:  1. Crestor 10 daily.  2. Aspirin 81 daily.  3. Cymbalta 30 daily.  4. Isosorbide 120 daily.  5. Metoprolol XL 25 daily.   PHYSICAL EXAMINATION:  Blood pressure 133/79, pulse 79 and regular,  weight 173.  GENERAL:  A 77 year old female sitting upright in no distress.  HEENT:  Normocephalic and atraumatic.  NECK:  Palpable bilateral carotid pulses without bruits.  LUNGS:  Clear to auscultation in all fields.  HEART:  Regular rate and rhythm.  S1 and S2.  Positive for no  significant murmurs.  ABDOMEN:  Soft and nontender.  EXTREMITIES:  Stable with no ecchymosis, hematoma, or bruit on  auscultation.  Intact femoral and distal pulse.  NEURO:  No focal deficits.   IMPRESSION:  1. Nonobstructive coronary artery disease.      a.     By recent cardiac catheterization.      b.     Normal left ventricular function.  2. Gastroesophageal reflux disease.      a.     Recent reportedly negative barium pill esophagram.      b.  History of Schatzki's ring, status post dilatation secondary       to recurrent dysphagia.      c.     History of mild erosive reflux esophagitis.  3. Dyslipidemia.      a.     Recently started on Crestor.  4. History of tobacco.  5. Hypertension.  6. Peripheral vascular disease.      a.     Moderate, diffuse aorto-iliac disease by cardiac       catheterization, May, 2007.  7. Patent bilateral renal arteries.   PLAN:  1. Uptitrate Toprol to 50 mg daily for better blood pressure and basal      heart rate control.  2. Surveillance fasting lipid/liver profile in 12 weeks, given that      the patient was recently started on Crestor.  3. Patient has expressed an interest in cardiac rehab, and we agree      and will refer her for this here at [facility].  4. Schedule return clinic followup with myself and Dr. Dannielle Burn in three      months.      [name], PA-C  Electronically Signed      Ernestine Mcmurray, MD,FACC  Electronically Signed   GS/MedQ  DD: 02/15/2007  DT: 02/16/2007  Job #: [id number]   cc:   Nicholes Calamity

## 2010-08-30 NOTE — Assessment & Plan Note (Signed)
[name], [name]                 CHART#:  [id number]   DATE:  01/15/2007                       DOB:  09-04-1932   REQUESTING PHYSICIAN:  Dr. Quillian Quince.   REASON FOR CONSULTATION:  Dysphagia.   HISTORY OF PRESENT ILLNESS:  The patient is a 77 year old Caucasian  female that reports a several week history of dysphagia.  She tells me  she feels as though both solids and liquids as well as pills are  becoming hung at her upper esophagus or in the back of her throat.  She  has had several choking episode.  She is having to eat tiny bites of  food, she complains of globus sensation for the last 3-4 months.  Her  symptoms are worse in the evenings.  She has had several episodes where  she felt like her air has been cut off.  She does have history of  chronic GERD, tells me her heartburn and indigestion is well controlled  on Protonix.  She has complaints of anorexia for 3 months now as well as  early satiety.  She tells me she can eat half a hamburger and feels  full.  She has lost a couple of pounds since the onset of these  symptoms.  She denies any change in her bowel habits, she continues to  have diarrhea about twice a week, she has history of IBS and microscopic  colitis.  She does take Lomotil which seems to help.  She denies any  rectal bleeding or melena.   PAST MEDICAL/SURGICAL HISTORY:  She has history of recurrent dysphagia,  last EGD was 01/06/2004 by Dr. Gala Romney, she was dilated with a 48 French  Maloney dilator.  Previously in 2004 she had a Schatzki's ring which was  dilated with a 56 [place] Maloney dilator.  She had a colonoscopy in 2003  by Dr. Gala Romney on 09/27/2001, she was found to have internal hemorrhoids  and biopsies were consistent with microscopic colitis.  She had a  descending colon polypectomy which was an inflammatory polyp as well as  mild chronic duodenitis with retention of villous architecture from  small bowel biopsy and EGD at that time showed distal  esophageal  erosions and mild erosive reflux esophagitis and an indistinct  Schatzki's ring which was dilated.  She had history of coronary artery  disease status post MI, hypertension, chronic GERD, panic attacks, right  knee replacement in December of 2007, right leg benign tumor,  hysterectomy, cholecystectomy, lumpectomy of the left breast, left hand  surgery, left rotator cuff surgery, sinus surgery and tonsillectomy.   CURRENT MEDICATIONS:  1. Metoprolol 25 mg daily.  2. Isosorbide 120 mg daily.  3. Pantoprazole 40 mg daily.  4. Nitroglycerin 0.4 mg p.r.n.  5. Lomotil 1 b.i.d. p.r.n.  6. Cymbalta 30 mg daily.  7. Aspirin 81 mg daily.   ALLERGIES:  FLOXIN, CODEINE, PREDNISONE.   FAMILY HISTORY:  There is no known family history of colorectal CA or  chronic GI problems.  Mother deceased at age 49 secondary to CVA, father  decreased at 82 with a history of Parkinson's disease.  She has 2  siblings and 5 children with histories significant for diabetes,  coronary artery disease, CVA and alcoholism.   SOCIAL HISTORY:  The patient is divorced, she has 5 grown children all  of whom are in poor health.  She is retired but going to work at  [employer].  She has a 50 pack year history of tobacco use, quit 5 years  ago. Rarely consumes alcoholic beverages and denies any drug use.   REVIEW OF SYSTEMS:  See HPI, otherwise negative.   PHYSICAL EXAMINATION:  VITAL SIGNS:  Weight 178 pounds, height 61  inches, temperature 97.9, blood pressure 140/78 and pulse 60.  GENERAL:  The patient is a well-developed, well-nourished Caucasian  female in no acute distress.  She is alert, oriented, pleasant and  cooperative.  HEENT:  Sclerae clear and nonicteric, conjunctivae pink, oropharynx pink  and moist without any lesions.  NECK:  Supple without mass or thyromegaly.  CHEST:  Heart regular rate and rhythm, normal S1-S2.  Without murmurs,  clicks, rubs or gallops.  LUNGS:  Clear to auscultation  bilaterally.  ABDOMEN:  Positive bowel sounds x4, no bruits auscultated, soft,  nontender, nondistended without palpable masses or hepatosplenomegaly.  No rebound, tenderness or guarding.  EXTREMITIES:  Without clubbing or edema bilaterally.  SKIN:  Pink, warm and dry without any rash or jaundice.   IMPRESSION:  The patient is a 77 year old Caucasian female with  recurrent dysphagia.  Interestingly her symptoms are with solids,  liquids and pills and she points to the upper esophagus or posterior  pharynx.  I am wondering if there could be an oropharyngeal component to  her dysphagia.  She is going to require further evaluation, she does  have history of Schatzki's ring.   PLAN:  1. Barium pill esophagram for further evaluation.  2. If barium pill esophagram shows web, ring or stricture she is going      to require EGD for esophageal dilatation.  At that point she would      need to be off her aspirin for 3 days.  3. Continue pantoprazole 40 mg daily.  4. Will call with barium pill esophagram findings and go from there.   Thank you, Tawni Carnes, P.A.-C. and Dr. Quillian Quince for allowing [place] to  participate in the care of the patient.       Vickey Huger, N.P.  Electronically Signed     R. Garfield Cornea, M.D.  Electronically Signed    KJ/MEDQ  D:  01/15/2007  T:  01/15/2007  Job:  [id number]   cc:   Aurther Loft, PA-C

## 2010-08-30 NOTE — Discharge Summary (Signed)
[name], [name]                ACCOUNT NO.:  [id number]   MEDICAL RECORD NO.:  [id number]          PATIENT TYPE:  INP   LOCATION:  3736                         FACILITY:  [facility]   PHYSICIAN:  Lauree Chandler, MDDATE OF BIRTH:  Aug 16, 1932   DATE OF ADMISSION:  10/13/2008  DATE OF DISCHARGE:  10/16/2008                               DISCHARGE SUMMARY   PRIMARY CARDIOLOGIST:  Ernestine Mcmurray, MD, [facility]   PRIMARY CARE PHYSICIAN:  Dr. Consuello Masse.   PROCEDURES PERFORMED DURING HOSPITALIZATION:  1. Cardiac catheterization, which was completed by Dr. Jenkins Rouge on      October 14, 2008.      a.     No significant change since 2008 revealing left main       proximal 30%, left anterior descending 50% to 60%, proximal       diagonal were normal.  Circumflex 30% proximal, 60% mid after OM1,       posterior descending artery 40%, right coronary artery small       nondominant with 40% to 50% proximal and mid, left ventricular       ejection fraction of 60%.  2. Adenosine stress Myoview.      a.     No evidence of ischemia per Dr. Loralie Champagne.   FINAL DISCHARGE DIAGNOSES:  1. Nonobstructive coronary artery disease.      a.     Status post cardiac catheterization on October 14, 2008,       revealing nonobstructive coronary artery disease.  No change since       prior catheterization in 2008.  2. Peripheral arterial disease.      a.     Moderate diffuse aortic iliac disease in 2007.  3. Hypertension.  4. Dyslipidemia.  5. Obstructive sleep apnea.  6. Gastroesophageal reflux disease.  7. Anxiety and depression.  8. Irritable bowel syndrome.  9. History of palpitations.  10.History of urinary tract infection (methicillin-resistant      Staphylococcus aureus) on September 22, 2008.   HOSPITAL COURSE:  This is a 77 year old Caucasian female with history of  nonobstructive CAD by previous catheterization who was transferred to  [facility] in the setting of recurrent chest discomfort.   The  patient is normally followed by Dr. Terald Sleeper in [place].  The patient  also presented with worsening shortness of breath with exertion.  The  patient's EKG did not show any acute ST-T wave changes, but it did show  some poor R-wave progression with an old anterior septal MI.  The  patient was subsequently transferred to [facility] after being  seen by Milon Dikes, PA  and Rozann Lesches, MD.  The patient did  undergo cardiac catheterization at [facility] on October 14, 2008, per Dr.  Johnsie Cancel.  Please see Dr. Kyla Balzarine thorough cardiac catheterization note  for more details.   Postcardiac catheterization, the patient was scheduled for adenosine  stress Myoview for evaluation of the ischemia in the setting of  nonobstructive CAD.  This was completed on October 16, 2008.  The patient  had no further  chest discomfort during hospitalization.  The patient did  have the stress Myoview revealing no evidence of ischemia with a normal  EF.  The patient tolerated the procedure well and has had no further  complaints of chest pain since admission to [facility].  The  patient's blood pressure was well controlled and the patient was seen  and examined by myself with evaluation per Dr. Angelena Form.  The patient  will be discharged home with followup with Dr. Dannielle Burn in [place].   DISCHARGE LABORATORY DATA:  Hemoglobin 12.2, hematocrit 35.4, white  blood cells 6.1, and platelets 199.  Cardiac enzymes were negative x3.  Sodium 140, potassium 4.0, chloride 107, CO2 28, glucose 118, BUN 14,  creatinine 0.92, cholesterol 187, triglycerides 147, HDL 48, and LDL  118.  Stress Myoview as discussed revealing no evidence of ischemia or  infarction.  Normal LV systolic function.   VITAL SIGNS ON DISCHARGE:  Blood pressure 123/58, pulse 58, respirations  20, temperature 97, and O2 sat 97% on room air.   DISCHARGE MEDICATIONS:  Aspirin 325 mg daily, metoprolol 12.5 mg twice a  day, Protonix 40 mg daily,  Cymbalta 60 mg at bedtime, isosorbide 120 mg  daily, Symbicort p.r.n., Omega-3 fish oil daily.   ALLERGIES:  CODEINE and QUINOLONES.   FOLLOWUP PLANS AND APPOINTMENT:  1. The patient will follow up with Dr. Terald Sleeper in our office in      approximately 6 weeks to 2 months.  She will continue on her      current medication regimen.  2. The patient has been given postcardiac catheterization instructions      with particular emphasis on the right groin site for evidence of      bleeding, hematoma, or signs of infection.  3. The patient will follow with her primary care physician for      continued medical management.  4. The patient has not had any medication changes and will continue      her medications prior to admission.   Time spent with the patient to include physician time was 35 minutes.      Phill Myron. Purcell Nails, NP      Lauree Chandler, MD  Electronically Signed    KML/MEDQ  D:  10/16/2008  T:  10/17/2008  Job:  [id number]   cc:   Consuello Masse

## 2010-08-30 NOTE — Assessment & Plan Note (Signed)
[place] OFFICE NOTE   AIRYONNA, BEVARD                       MRN:          [id number]  DATE:12/12/2007                            DOB:          [birth year]    HISTORY OF PRESENT ILLNESS:  The patient is an elderly female with a  history of nonobstructive coronary artery disease.  Details are provided  in the chart.  She has had no symptoms consistent with angina.  She was  recently hospitalized for anxiety attacks.  Her leg cramps have  improved.  She denies any orthopnea or PND.  There is some exertional  dyspnea, but relates this to her weight.  The patient does report also a  headache, which is located frontally.   MEDICATIONS:  1. Isosorbide 60 mg 2 tablets daily.  2. Aspirin 325 daily.  3. Cymbalta 60 mg p.o. daily.  4. Metoprolol 50 mg 1/4 of a tablet b.i.d.  5. Zinc, calcium, and magnesium tablets.   PHYSICAL EXAMINATION:  VITAL SIGNS:  Blood pressure 134/73, heart rate  65, weight 181 pounds.  NECK:  Normal carotid upstroke.  No carotid bruits.  LUNGS:  Clear breath sounds bilaterally.  HEART:  Regular rate and rhythm.  Normal S1 and S2.  No murmurs, rubs,  or gallops.  ABDOMEN:  Soft.  EXTREMITY:  No cyanosis, clubbing, or edema.   PROBLEMS:  1. Nonobstructive coronary artery disease.  2. Crestor intolerance with elevated aldolase level.  3. Gastroesophageal reflux disease.  4. Dyslipidemia.  5. Hypertension, stable.  6. Peripheral vascular disease with moderate diffuse aortoiliac      disease by catheterization, March 2007.  7. Headache.   PLAN:  1. I suggested the patient cut her Imdur in half and see if this      improves her headaches.  2. We will further start the patient with a statin drug therapy as she      has consistently      demonstrated muscle pains even with elevated aldolase level.  3. The patient can see [place] back in 1 year or earlier if any problems       arise.     Ernestine Mcmurray, MD,FACC  Electronically Signed    GED/MedQ  DD: 12/12/2007  DT: 12/13/2007  Job #: [id number]   cc:   Consuello Masse

## 2010-08-30 NOTE — Assessment & Plan Note (Signed)
NAMEATLANTA, JANES                 CHART#:  [id number]   DATE:  06/19/2007                       DOB:  1932-12-03   Followup dysphagia, history of chronic diarrhea, gastroesophageal reflux  disease.  The patient has had some dysphagia symptoms that sound more  oropharyngeal to me, and, in fact, does have oropharyngeal dysfunction  based on speech therapy evaluation previously.  She really did not get  started with treatment sessions when she developed what she describes as  strep throat back in December and was given antibiotics.  She has been  trying to follow their instructions at home, says dysphagia is a bit  better.  Prior barium pill esophagram demonstrated esophageal motility,  but the barium pill rapidly traversed the esophagus to the stomach.  Prior history of Schatzki's ring, dilated previously by me.  Reflux  symptoms well controlled on pantoprazole 40 mg orally daily.  She  continues to complain about diarrhea, this goes back several years.  She  did have some findings on colonoscopy back in 2003 (biopsy) consistent  with microscopic colitis.  We gave her some Rowasa enemas which she  took, but does not recall much in the way of any improvement.  I do not  recall and do not see that she was ever on any oral agents, and this was  really in the pre-Entocort era.  She rarely has a formed bowel movement,  may go 5 to 10 times daily, and has had to modify her daily routine  because of bowel dysfunction.  She is not passing any blood per rectum.  There was no evidence of inflammatory bowel disease on prior  colonoscopy.  Her small bowel biopsy demonstrated some minimal  nonspecific changes, but nothing consistent with celiac disease.   CURRENT MEDICATIONS:  See updated list.   ALLERGIES:  FLOXIN, CODEINE, and PREDNISONE.   PHYSICAL EXAMINATION:  She appears at baseline, weight 184, height 5  feet 1 inch, temp 97.4, BP 150/80, pulse 64.  SKIN:  Warm and dry.  ABDOMEN:  Obese,  positive bowel sounds, soft, nontender without  appreciable mass or organomegaly.   ASSESSMENT:  1. Oropharyngeal dysphagia:  Some improvement with speech therapy      exercises.  I have advised her to get back with the speech therapy,      continue the exercises.  She does have dysmotility on a barium pill      esophagram study, but did not have any mechanical impediment to      transit the barium pill.  She could have an underlying esophageal      motility disorder, but right now nothing to suggest achalasia,      which is really the only esophageal motility disorder that would be      diagnosed with manometry that has any meaningful treatment.  2. Diarrhea:  Sounds fairly refractory.  It has been some time since      she had any stool studies.  I wonder if this is just ongoing      microscopic colitis.   RECOMMENDATIONS:  1. Continue speech therapy exercises.  Plan to see this nice lady back      in 3 months to reassess, then will decide about manometry.  2. Gastroesophageal reflux disease, well controlled on pantoprazole,  continue this agent.  3. Diarrhea, will check a complete set of stool studies today.  If      they are negative, will contemplate a course of Entocort for      microscopic colitis (will need to delve into the prednisone allergy      further).  4. Further recommendations to follow.       Bridgette Habermann, M.D.  Electronically Signed     RMR/MEDQ  D:  06/19/2007  T:  06/19/2007  Job:  [id number]   cc:   [place]

## 2010-09-02 NOTE — Op Note (Signed)
[name], [name]                ACCOUNT NO.:  [id number]   MEDICAL RECORD NO.:  [id number]          PATIENT TYPE:  INP   LOCATION:  0006                         FACILITY[facility]  [facility]   PHYSICIAN:  Tarri Glenn, M.D.  DATE OF BIRTH:  Aug 31, 1932   DATE OF PROCEDURE:  03/28/2006  DATE OF DISCHARGE:                               OPERATIVE REPORT   PREOPERATIVE DIAGNOSIS:  Osteoarthritis right knee.   POSTOPERATIVE DIAGNOSIS:  Osteoarthritis right knee.   OPERATION:  Osteonics total knee replacement, right.   SURGEON:  Tarri Glenn, M.D.   ASSISTANTElodia Florence. Underwood, P.A.-C.   ANESTHESIA:  Spinal.   JUSTIFICATION FOR PROCEDURE:  She has had full gamut of non-surgical  treatment such as of viscous supplementation prior to the knee  replacement.  She had significant lateral compartment, patellofemoral  wear in particular with pain.   PROCEDURE:  Prophylactic antibiotic, satisfactory spinal anesthesia,  pneumatic tourniquet, the right leg was prepped from tourniquet to ankle  with DuraPrep and draped in a sterile field.  Ioban employed.  The leg  was Esmarch'd out non-sterilely.  A vertical midline incision down to  the patellar mechanism, median parapatellar incision to open the joint.  The patellar mechanism was freed up. The patella everted and the knee  flexed.  She had minimal osteophytes from the patella and the femur. I  undermined the medial collateral ligament and pes anserinus slightly for  exposure of the medial tibial plateau.  A 5/16 inch drill hole was  placed in the distal femur followed by the axis aligner set for 5  degrees valgus for the right knee.  The 10 mm distal femoral cut was  made.  She did not have any flexion contracture. On sizing, she sized  out at a #7 with the sizing jig. The scribe lines were placed on the  distal femur and the distal femoral cutting jig placed. Anterior and  posterior cuts and posterior, anterior, and chamfers were made.  I  then  went to the tibia where I made a leveling cut, sized the tibia at a  number 7.  Centering hole followed by step-cut drill hole was made  followed by the canal finder and the intramedullary rod and I elected to  make a 4 mm cut from the medial tibial plateau.  The initial cut was  made 9 degrees posteriorly and remnants of the menisci, ACL, and PCL  were removed.  I then placed the laminar spreader and removed remnants  of bone and soft tissue behind the femoral condyles and placed the  femoral jig for creating the patella and the notchplasty. These steps  were taken.  I then went through a trial reduction and there was  adequate space for at least a 10 mm spacer.  I then used the  extramedullary rod splitting the bimalleolar distance and made  appropriate scribe lines on the anterior tibial surface lining up the  tibial component.  While the knee was in extension, I then used the 10-  mm recessed cutting jig for a 26 mm patellar prosthesis.  The recess  cut  was made and I then used the guide for making three fixation holes which  were drilled.  Trial prosthesis was placed and extra bone around the  perimeter of the button was removed.  I then returned to the tibia and  placed the tibial base plate onto the tibia with three fixation pins  based on the previous scribe lines.  We used the tripod apparatus  reaming up to a 7 cemented.  We then water picked the knee while the  components were opened and the glue was mixed.  I then individually  glued in the three components, starting first with the tibia impacting  the tibial base plate and removing excess methacrylate, then moving to  the femur, impacting and removing excess methacrylate, and finally the  patella with the same, holding the patella with the holding clamp.  I  then held the knee in extension with a 10 mm spacer until the  methacrylate had hardened and I trimmed up small amounts of methyl  methacrylate from the components.   I then went through another series of  trial reductions and 10 mm was too loose, 15 mm spacer left a little  flexion contracture, and a 12 mm spacer appeared to be ideal with the  knee joint in neutral and good medial and lateral extension and good  medial and lateral stability on flexion/extension, the patella was  stable in the midline and no lateral release was required.  Accordingly,  I went ahead, after irrigating the wound well, and placed the final 12  mm posterior stabilized spacer.  I checked range of motion once again of  the knee and then placed a Hemovac and closed the wound over the Hemovac  with interrupted #1 Vicryl in the synovium and fascia distal to the  quadriceps and two layers in the quadriceps of #1 Vicryl.  The  subcutaneous tissue was closed with a combination of #1 and 2-0 Vicryl,  staples in the skin.  Betadine and Adaptic dry sterile dressing were  applied.  She tolerated the procedure well and was taken to the recovery  room in satisfactory condition with no known complications, no blood  loss, no blood replacement.           ______________________________  Tarri Glenn, M.D.     JA/MEDQ  D:  03/28/2006  T:  03/28/2006  Job:  [id number]

## 2010-09-02 NOTE — H&P (Signed)
[name], [name]                ACCOUNT NO.:  [id number]   MEDICAL RECORD NO.:  [id number]          PATIENT TYPE:  INP   LOCATION:  NA                           [facility]ACILITY[facility]  [facility]   PHYSICIAN:  Tarri Glenn, M.D.  DATE OF BIRTH:  December 08, 1932   DATE OF ADMISSION:  03/23/2006  DATE OF DISCHARGE:                              HISTORY & PHYSICAL   CHIEF COMPLAINT:  Pain in my right knee.   HISTORY OF PRESENT ILLNESS:  A 77 year old lady seen by [place] for continued  progressive problems concerning pain into her right knee.  She has had  extensive conservative care to this knee including Hyalgan injections.  X-rays have shown, including MRI, a lateral meniscus that is not present  as well as severe degenerative changes and valgus deformities.  He has  had surgery in the past on his right knee for a tumor.  We have  attempted to keep her going with conservative care, but at this point in  time, it was decided after much discussion to undergo right total knee  replacement arthroplasty.  The risks and benefits of surgery have been  described to the patient.   She also has some pain into the lumbar spine.  MRIs have shown no spinal  stenosis present, but she has osteoarthritis.  Hopefully, by  restoring  lengths and straightening the right knee, she will have some reduction  of her back pain and right buttock pain.   PAST MEDICAL HISTORY:  The patient has been cleared preoperatively by  [name], PA-C and Dr. Dannielle Burn with [place] in the [facility]  Cardiology office.  The patient's current medical problems are COPD,  GERD, coronary artery disease and hypertension.  She also has occasional  urinary tract infections.   PAST SURGICAL HISTORY:  She had a cholecystectomy done in 1998.   CURRENT MEDICATIONS:  1. Aspirin 81 mg one a day which she will stop prior to surgery.  2. Protonix 40 mg daily.  3. Isosorbide 60 mg a daily.  4. Toprol XL 50 mg daily.   ALLERGIES:  FLOXIN which  causes severe swelling and PREDNISONE which  causes itching.   FAMILY PHYSICIAN:  Dr. Quintin Alto.   FAMILY HISTORY:  Positive for heart disease, hypertension, diabetes,  stroke, arthritis and bleeding disorders (unknown type).   SOCIAL HISTORY:  The patient is divorced.  She has been a Facilities manager.  No intake of alcohol or tobacco products.  [name] will be  major caregiver after surgery.  She lives in [place] home with no  stairs.   REVIEW OF SYSTEMS:  CNS:  No seizures or paralysis, numbness, double  vision.  RESPIRATORY:  No productive cough, no hemoptysis or shortness  of breath.  She did have bronchitis back in 2003, and a pneumonia in  2006.  CARDIOVASCULAR:  No chest pain, no angina.  She does have some  shortness of breath with exertion.  GASTROINTESTINAL:  No nausea, bloody  stool.  She does have infrequent diarrhea.  The Protonix seems to take  care of her reflux disease nicely.  GENITOURINARY:  No discharge,  dysuria or hematuria, but she does have a history of urinary tract  infections.  MUSCULOSKELETAL:  Primarily in present illness with the  right knee and right lower extremity.   PHYSICAL EXAMINATION:  GENERAL:  Alert, cooperative, friendly, 75-year-  old, white female who is accompanied by her daughter.  VITAL SIGNS:  Blood pressure 140/68, pulse 72, respirations 12.  HEENT:  Normocephalic, wears glasses.  PERRLA intact.  Oropharynx is  clear.  Dentures are seen.  NECK:  Supple.  No lymphadenopathy.  CHEST:  Clear to auscultation, no rhonchi, rales or wheezes.  HEART:  Regular rate and rhythm.  No murmurs are heard.  ABDOMEN:  Slightly obese, nontender.  Liver spleen not felt.  GENITALIA/RECTAL/PELVIC/BREASTS:  Not done and not pertinent to present  illness.  EXTREMITIES:  The patient has pain and crepitus range of motion in right  knee and valgus deformity noted.   ADMISSION DIAGNOSES:  1. End-stage osteoarthritis of right knee.  2. Gastroesophageal  reflux disease.  3. Coronary artery disease.  4. Chronic obstructive pulmonary disease.  5. Hypertension.   PLAN:  The patient will undergo a right total knee replacement  arthroplasty.  She will have home health after her regular  hospitalization.  Her daughter plans to be in close attendance with her  during her home stay.      Dooley L. Vanita Ingles.    ______________________________  Tarri Glenn, M.D.    DLU/MEDQ  D:  03/20/2006  T:  03/20/2006  Job:  [id number]   cc:   [name], PA-C   Ernestine Mcmurray, MD,FACC  [address] [place], [place] [address]

## 2010-09-02 NOTE — Assessment & Plan Note (Signed)
[place] CARDIOLOGY OFFICE NOTE   SABRA, PLANTZ                       MRN:          [id number]  DATE:08/03/2006                            DOB:          01/24/1933    PRIMARY CARDIOLOGIST:  Dr. Terald Sleeper   REASON FOR VISIT:  [name] is a 77 year old female, with known non-  obstructive coronary artery disease by previous catheterizations, who  now presents with complaints of  chest pain.  She was last seen here in  the clinic in December 2007, by Dr. Dannielle Burn. The patient states that she  successfully underwent subsequent total knee replacement and was not  having any chest pain at or around that time.  Since then, however, she  reports progressive exertional chest discomfort with associated dyspnea,  reminiscent of her symptoms prior to her last catheterization, which was  in May 2007.  At that time, however, she was found to have no  significant change from a catheterization one year earlier with respect  to non-obstructive coronary artery disease, with a moderate proximal LAD  lesion.  She was thus referred for an adenosine stress test which was  felt to represent a low risk study with preserved left ventricular  function.   The patient cites onset of chest pain with either sweeping floors or  climbing stairs, with resolution of symptoms within 5 to 10 minutes.  Of  note, she has not used any nitroglycerin and has not presented to the  emergency room with these symptoms.  She states that they have been  occurring on a daily basis.  They are described as dull, midsternal with  radiation to the jaw.   Electrocardiogram today reveals NSR at 61 BPM with left axis deviation  and no change since previous study.   MEDICATIONS:  1. Aspirin 81 daily.  2. Toprol XL 25 daily.  3. Imdur 60 daily.  4. Protonix.   PHYSICAL EXAM:  Blood pressure 138/81, pulse 63 regular, weight 168.  GENERAL:  A 77 year old  female sitting upright in no distress.  HEENT:  Normocephalic, atraumatic.  NECK:  Palpable bilateral pulses without bruits.  LUNGS:  Clear to auscultation in all fields.  HEART:  Regular rate and rhythm (S1, S2) no murmurs, rubs or gallops.  ABDOMEN:  Benign.  EXTREMITIES:  Palpable pulse without edema.  NEURO:  No focal deficit.   IMPRESSION:  1. Progressive exertional chest discomfort/dyspnea.      a.     Question coronary artery disease progression verses       vasospasm.      b.     History of nonobstructive coronary artery disease by several       previous cardiac catheterizations, the last May 2007.      c.     Low risk adenosine stress Cardiolite; ejection fraction 67%,       July 2007.  2. Hyperlipidemia.  3. Hypertension.  4. History of tobacco.  5. Status post right total knee replacement, December 2007.   PLAN:  Following consultation with Dr. Dannielle Burn, recommendation is to  proceed with an initial trial of medical therapy for treatment of  possible coronary vasospasm, following review of her recent  catheterization and followup stress test.  Therefore, we will add  Norvasc 5 q. day to her current medication regimen and have her return  in 2 weeks for reassessment of symptoms.  If, however, she reports no  benefit with the addition of Norvasc, then we will reconsider proceeding  with another cardiac catheterization.  The patient is quite agreeable  with this plan.  Of note, she was also provided with a new prescription  for nitroglycerin and instructions to proceed directly to the emergency  room, in the event of worsening of symptoms or discomfort not relieved  by nitroglycerin.      [name], PA-C  Electronically Signed      Ernestine Mcmurray, MD,FACC  Electronically Signed   GS/MedQ  DD: 08/03/2006  DT: 08/03/2006  Job #: [id number]

## 2010-09-02 NOTE — Assessment & Plan Note (Signed)
[place] OFFICE NOTE   [name], [name]                       MRN:          [id number]  DATE:03/27/2006                            DOB:          02-25-1933    HISTORY OF PRESENT ILLNESS:  The patient is a 77 year old female with a  history of nonobstructive coronary artery disease by catheterization in  May of this year. The patient was cleared for surgery recently. She had  a nonischemic Cardiolite stress-study. She has been scheduled for a  total knee replacement by Dr. Tarri Glenn and she follows up today  to discuss her laboratory work.  LDL cholesterol remains elevated at 125  with an HDL of 48, triglycerides of 77 and total cholesterol of 188.   ALLERGIES:  The patient has been intolerant to LIPITOR in the past.   CURRENT MEDICATIONS:  1. Imdur 60 mg daily.  2. Toprol XL 50 mg daily.  3. Aspirin 81 mg daily.  4. Protonix 40 mg p.o. daily.   PHYSICAL EXAMINATION:  VITAL SIGNS: Blood pressure is 128/76, heart rate  76 beats per minute, weight is 178 pounds.  NECK: Normal carotid upstrokes without bruits.  LUNGS: Clear breath sounds bilaterally.  HEART: Regular rate and rhythm.  ABDOMEN: Soft.  EXTREMITIES: No edema.   PROBLEM LIST:  1. Nonobstructive single vessel coronary artery disease.      a.     Cardiac catheterization in May, 2007.      b.     Low risk Adenosine, Cardiolite study with ejection fraction       67% July, 2007.      c.     Chronic obstructive pulmonary disease.  2. History of tobacco use.  3. Dyslipidemia.  4. Right knee arthritis.  5. Hypertension.   PLAN:  1.The patient will proceed with knee surgery tomorrow.  1. I have given the patient a prescription for simvastatin today.     Ernestine Mcmurray, MD,FACC  Electronically Signed    GED/MedQ  DD: 03/27/2006  DT: 03/27/2006  Job #: [id number]   cc:   Tarri Glenn, M.D.

## 2010-09-02 NOTE — Discharge Summary (Signed)
[name], [name]                ACCOUNT NO.:  [id number]   MEDICAL RECORD NO.:  [id number]          PATIENT TYPE:  OIB   LOCATION:  2887                         FACILITY:  [facility]   PHYSICIAN:  Glori Bickers, M.D. LHCDATE OF BIRTH:  [birth year]   DATE OF ADMISSION:  08/19/2004  DATE OF DISCHARGE:  08/19/2004                                 DISCHARGE SUMMARY   PROCEDURES:  1.  Cardiac catheterization.  2.  Coronary arteriogram.  3.  Left ventriculogram.   DISCHARGE DIAGNOSES:  1.  Chest pain status post cardiac catheterization with heavily calcified      60% LAD, 75% stenosis in OM-2, and a 95% stenosis in a small OM-3. Her      ejection fraction was 60% without mitral regurgitation or wall motion      abnormalities. The LAD lesion is calcified and the OM-2 does not appear      critical. The OM-3 is tight but this is a small vessel. Medical therapy      is best option but if she develops exertional angina, consider PCI of      the OM-2. Add Imdur to her medication regimen.  2.  Tobacco use.  3.  Family history of coronary artery disease.  4.  History of dyslipidemia. Lipitor 40 added this admission.  5.  A possible upper respiratory infection. Complete antibiotics as an      outpatient.  6.  Allergy or intolerance to Floxin and codeine.  7.  Status post TAH, breast biopsy, laparoscopic cholecystectomy.  8.  History of osteoarthritis.  9.  History of depression.  10. History of obesity.  11. History of gastroesophageal reflux disease symptoms.  12. Possible claudication symptoms, evaluate as an outpatient.   DISCHARGE INSTRUCTIONS:  1.  Activity level is to include no driving or strenuous activity for 2      days.  2.  She is to stick to a low-fat diet.  3.  She is to call the office for problems with the cath site.  4.  She is to follow up with Dr. Tarri Glenn and call for appointment. She is to      follow up with Dr. Dannielle Burn at the office on May 18 at 1:15.   DISCHARGE  MEDICATIONS:  1.  Paxil 40 mg daily.  2.  Caltrate D b.i.d.  3.  Trazodone 50 mg q.h.s. p.r.n.  4.  Protonix 40 mg daily.  5.  Imdur 30 mg daily.  6.  Toprol XL 50 mg daily.  7.  Lipitor 40 mg daily.  8.  Aspirin 81 mg daily.  9.  Complete antibiotics.   DISCHARGE LABS:  She will need fasting lipid profile and liver function  testing in 6 weeks.      RB/MEDQ  D:  08/19/2004  T:  08/20/2004  Job:  [id number]

## 2010-09-02 NOTE — Discharge Summary (Signed)
[name], [name]                ACCOUNT NO.:  [id number]   MEDICAL RECORD NO.:  [id number]          PATIENT TYPE:  INP   LOCATION:  [place]                         FACILITY:  [facility]   PHYSICIAN:  Tarri Glenn, M.D.  DATE OF BIRTH:  1932/12/21   DATE OF ADMISSION:  03/28/2006  DATE OF DISCHARGE:  04/01/2006                               DISCHARGE SUMMARY   ADMISSION DIAGNOSES:  1. End-stage osteoarthritis of the right knee.  2. Gastroesophageal reflux disease.  3. Coronary artery disease.  4. Chronic obstructive pulmonary disease.  5. Hypertension.   DISCHARGE DIAGNOSES:  1. End-stage osteoarthritis of the right knee.  2. Gastroesophageal reflux disease.  3. Coronary artery disease.  4. Chronic obstructive pulmonary disease.  5. Hypertension.  6. Mild postoperative hemorrhagic anemia.   OPERATION:  On March 28, 2006 the patient underwent osteonic total  knee replacement arthroplasty of the right knee. D.L. Delorse Lek, [facility]  assisted.   BRIEF HISTORY:  This is a 77 year old lady with progressive problems  concerning pain in the right knee. She was getting Hyalgan injections,  antiinflammatories, exercises, but unfortunately has not improved her  knee. She has severe degenerative changes by x-ray with valgus  deformity. Conservative care has failed and after much discussion,  including the risk and benefits of surgery, decided she would benefit  from surgical intervention and was admitted for the above procedure.   HOSPITAL COURSE:  The patient tolerated the surgical procedure quite  well. She was placed in the total knee protocol and did quite well. A  Hemovac was discontinued the first day of surgery and dressing was done  on the second day. She did well with physical therapy. She used only  Tylenol for her discomfort. The wound was clean and dry, neurovascular  was intact in the opposite extremity.   The patient had been placed on Coumadin protocol postoperatively for  the  prevention of DVT. This was followed closely by pharmacy. On the day of  discharge the patient had no complaints, the knee wound was clean and  staples were intact. It was felt she could be maintained in a home  environment with Ar[facility] Go for [facility] and once these were in place  she was discharged home.   LABORATORY VALUES:  She had a preoperative CBC completely within normal  limits, hemoglobin was 12.8, hematocrit 37.7. Final hemoglobin was 10.1  with hematocrit of 28.9. Blood chemistry remained normal other than very  minimal reduction in her sodium at 133. She also had some mild  hypokalemia at 3.2.   Electrocardiogram showed sinus bradycardia, possible inferior myocardial  infarction, age undetermined. Chest x-ray showed no active  cardiopulmonary disease.   CONDITION ON DISCHARGE:  Improved, stable.   PLAN:  The patient is discharged to her home to follow-up with [place] in 2  weeks from the date of surgery. Continue with Home Health, weight  bearing as tolerated. Continue on Coumadin protocol as well.   She is given a prescription for Coumadin, Robaxin as a muscle relaxant,  and Talwin for severe pain. She will be using Tylenol primarily for her  discomfort.      Dooley L. Vanita Ingles.    ______________________________  Tarri Glenn, M.D.    DLU/MEDQ  D:  04/26/2006  T:  04/26/2006  Job:  [id number]   cc:   [name], PA-C   Ernestine Mcmurray, MD,FACC  [address] [place], [place] [address]

## 2010-09-02 NOTE — H&P (Signed)
NAME:  Tonya, [name]                          ACCOUNT NO.:  [id number]   MEDICAL RECORD NO.:  [id number]                  PATIENT TYPE:   LOCATION:                                       FACILITY:   PHYSICIAN:  R. Garfield Cornea, M.D.              DATE OF BIRTH:  [birth year]   DATE OF ADMISSION:  DATE OF DISCHARGE:                                HISTORY & PHYSICAL   CHIEF COMPLAINT:  Recurrent esophageal dysphagia, reflux symptoms, diarrhea.   HISTORY OF PRESENT ILLNESS:  [name] is a pleasant 77 year old obese  Caucasian female who has a history of microscopic colitis seen on  colonoscopy with prior biopsies who also has a history of gastroesophageal  reflux disease and Schatzki's  ring, underwent esophageal dilation with a 35  French Maloney dilator back in June 2003.  She had been doing well on  Aciphex and Asacol.  However, due to financial constraints she really does  not stay on either acid suppression or mesalamine therapy and consequently  has had recurrence of symptoms.  She also has gained 17 pounds since she was  seen here last year.  She has missed numerous appointments with me.  She has  not passed any blood per rectum and again is having recurrent esophageal  dysphagia.  Prior small bowel biopsies were negative for villus atrophy.  A  polyp removed from her descending colon turned out to be inflammatory.   PAST MEDICAL HISTORY:  Significant for anxiety neurosis, sleep disorder,  microscopic colitis, gastroesophageal reflux disease.   PAST SURGERY:  Cholecystectomy, breast surgery for benign disease, sinus  surgery.   CURRENT MEDICATIONS:  1. Trazodone 50 mg at bedtime.  2. Evista 60 mg - currently not taking.  3. Paxil 30 mg daily.  4. Caltrate 600/vitamin D b.i.d.  5. OTC multivitamin daily.  6. Tylenol p.r.n.   Currently not taking Aciphex or Asacol.   ALLERGIES:  FLOXIN.   SOCIAL HISTORY:  The patient is divorced, has five children.  She has  worked  at [employer] for years.  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  Smokes one pack of cigarettes per day, rarely consumes  alcohol.   REVIEW OF SYSTEMS:  As in history of present illness.   PHYSICAL EXAMINATION:  GENERAL:  Reveals a pleasant elderly female who  appears at her baseline.  VITAL SIGNS:  Weight 179, blood pressure 130/78, pulse 72.  SKIN:  Warm and dry, no jaundice.  HEENT:  No scleral icterus.  JVD is not prominent.  CHEST:  Lungs are clear to auscultation.  CARDIAC:  Regular rate and rhythm without murmur, gallop, rub.  ABDOMEN:  Nondistended, positive bowel sounds, obese, soft, nontender.  EXTREMITIES:  No edema.   IMPRESSION:  [name] is a 76 year old lady with microscopic colitis  responsive to systemic mesalamine therapy in the way of Asacol; however,  noncompliance as stated above has been associated with recurrent symptoms.  Likewise, gastroesophageal reflux disease has been well controlled on acid  suppression previously but she is not taking her prescribed medications with  any regularity.  This is a tough situation; I understand she has financial  constraints.  She has recurrent esophageal dysphagia with a known Schatzki's  ring.  Unfortunately she has continued to gain weight which is thwarting our  efforts to treat her gastroesophageal reflux disease.   RECOMMENDATIONS:  EGD with esophageal dilation as appropriate in the near  future at [facility].  I have discussed this approach with the  patient.  Potential risks, benefits, alternatives have been reviewed.  I  have given her samples of Asacol two tablets orally t.i.d. for her colitis.  She needs to stay on this medication daily.  I have also given her samples  of Aciphex 20 mg orally daily.  I have encouraged weight loss.  Will make  further recommendations after EGD has been performed.     ___________________________________________                                          Bridgette Habermann, M.D.   RMR/MEDQ  D:  01/20/2003  T:  01/20/2003  Job:  [id number]   cc:   Sandford Craze  [place]  [place] [address]  Fax: [phone]

## 2010-09-02 NOTE — Op Note (Signed)
NAME:  [name], [name]                ACCOUNT NO.:  [id number]   MEDICAL RECORD NO.:  [id number]          PATIENT TYPE:  AMB   LOCATION:  DAY                           FACILITY:  [facility]   PHYSICIAN:  R. Garfield Cornea, M.D. DATE OF BIRTH:  [birth year]   DATE OF PROCEDURE:  01/06/2004  DATE OF DISCHARGE:                                 OPERATIVE REPORT   PROCEDURE:  Esophagogastroduodenoscopy with Venia Minks dilation.   INDICATIONS:  The patient is a 77 year old lady with esophageal dysphagia.  She underwent an EGD on January 27, 2003. She was found to have a Schatzki's  ring and 56-French Maloney dilator was passed.  She did well until recently  when she developed recurrent esophageal dysphagia.  An EGD is now being done  to __________ .  This approach has been discussed with the patient at  length.  The potential risks, benefits and alternatives have been reviewed,  questions answered and she is agreeable.  Please see documentation in the  medical record dictated January 04, 2004.   DESCRIPTION OF PROCEDURE:  Oxygen saturation, blood pressure, pulse and  respiration were monitored throughout the entire procedure.  Conscious  sedation with Versed 4 mg IV, Demerol 75 mg IV.  The instrument was the  Olympus video chip system.   FINDINGS:  Esophagus:  Examination of the tubular esophagus revealed really  no mucosal abnormalities.  I was unable to identify a ring or other  obstructing lesion.  Mucosa appeared normal.  The EG junction was easily  traversed.   Stomach:  The gastric cavity was empty and insufflated well with air.  Examination of the gastric mucosa including the retroflexed view of the  proximal stomach and esophagogastric junction demonstrated no abnormalities.  Pylorus was patent and easily traversed.  Duodenal bulb and second portion  revealed no abnormalities.   THERAPY AND DIAGNOSTIC MANEUVERS:  A 58-French Maloney dilator was passed on  insertion with ease without blood  on withdrawal.  A look back revealed some  blood at the cricopharynx.  No apparent complications related to passage of  the dilator.  Otherwise the patient tolerated the procedure well and was  reactive after  endoscopy.   IMPRESSION:  1.  Endoscopically normal appearing tubular esophagus, normal esophageal      mucosa, status post passage of a 58-French Maloney dilator as described      above.  2.  Normal stomach, normal D1 and D2.   RECOMMENDATIONS:  1.  Continue Protonix 40 mg orally daily for gastroesophageal reflux      disease.  Should she have recurrent dysphagia in the future, would opt      for a barium pill esophagogram.  2.  Office appointment with me in one month.  If her dysphagia does not      resolve, she will need further evaluation.      RMR/MEDQ  D:  01/06/2004  T:  01/06/2004  Job:  [id number]   cc:   Sandford Craze  [place]  [place] [address]  Fax: [phone]

## 2010-09-02 NOTE — Assessment & Plan Note (Signed)
[facility] OFFICE NOTE   [name], [name]                       MRN:          [id number]  DATE:03/27/2006                            DOB:          1932-07-22    CANCELLED DICTATION     Ernestine Mcmurray, MD,FACC     GED/MedQ  DD: 03/27/2006  DT: 03/27/2006  Job #: [id number]

## 2010-09-02 NOTE — Assessment & Plan Note (Signed)
[place] OFFICE NOTE   [name], [name]                       MRN:          [id number]  DATE:08/16/2006                            DOB:          16-Apr-1933    Primary cardiologist:  Ernestine Mcmurray, MD   REASON FOR VISIT:  [name] returns for a scheduled follow-up.  Please  refer to my recent clinic note of April 18 for full details.   At that time we started Norvasc 5 mg daily for suppression of chest pain  which was felt to be possibly due to coronary vasospasm following review  of a cardiac catheterization of a year ago, which showed stable,  nonobstructive coronary artery disease.  In fact, she has had a history  of nonobstructive CAD by several previous catheterizations.  Moreover,  she had a low-risk adenosine stress Cardiolite in July of that same  year.   I also checked a fasting lipid profile:  Total cholesterol 200,  triglycerides 71, HDL 53 and LDL 133.  The patient apparently had been  on Lipitor in the past but tells me today that she stopped this  secondary to development of pruritus.   Since starting the Norvasc, the patient notes a palpable improvement in  the frequency/intensity of her chest pain episodes.  She tells me that  she feels some better and, in fact, had no chest pain in walking up  the stairs to our clinic today, as she did when she was last seen her.   The patient then describes a singular episode which occurred while  standing at the kitchen sink:  She began feeling woozy but did not fall  or pass out.  She experienced floaters of the left eye as well as  perioral numbness extending down into the left arm.  She then developed  midsternal chest pain, dyspnea, and had a sensation of a flutter in  her chest.  She took a nap and the symptoms did not recur after she  awoke, but she did feel fatigued.   She tells me that she has a history of palpitations and that a  similar  episode has happened on at least 3-4 occasions in the past.   CURRENT MEDICATIONS:  As previously outlined, with the addition of  Norvasc 5 mg daily.   PHYSICAL EXAMINATION:  VITAL SIGNS:  Blood pressure 132/77, pulse 66 and  regular, weight 170.  GENERAL:  A 77 year old female sitting upright in no distress.  HEENT:  Normocephalic, atraumatic.  NECK:  Supple.  Bilateral carotid pulses without bruits.  No JVD.  LUNGS:  Clear to auscultation in all fields.  HEART:  Regular rate and rhythm (S1, S2).  No murmurs, rubs or gallops.  ABDOMEN:  Benign.  EXTREMITIES:  Palpable pulses.  No significant edema.  NEUROLOGIC:  No focal deficit.   IMPRESSION:  1. Recurrent angina pectoris.      a.     Significantly improved with addition of Norvasc.      b.     Question secondary to coronary vasospasm.  c.     History of nonobstructive coronary artery disease by several       previous catheterizations, the last in May 2007 when:      d.     Low-risk adenosine Cardiolite, ejection fraction 67%, July       2007.  2. Tachypalpitations.      a.     History of palpitations.  3. Hyperlipidemia.  4. History of tobacco.  5. Hypertension.   PLAN:  1. Schedule CardioNet monitoring for 3 weeks to exclude intermittent      dysrhythmia as the etiology for her recent episode of      tachypalpitations, with associated symptoms as outlined above.  2. Check blood work with a basic metabolic profile and TSH level.  3. Increase Norvasc to 10 mg daily for continued aggressive management      of breakthrough angina pectoris, with probable etiology of coronary      vasospasm.  4. Resume aggressive lipid management with Crestor 10 mg daily.  The      patient will need a follow-up fasting lipid/liver profile in 12      weeks.  5. Schedule return clinic follow-up with myself and Dr. Dannielle Burn in 4-6      weeks.      [name], PA-C       Ernestine Mcmurray, MD,FACC    GS/MedQ  DD: 08/16/2006   DT: 08/16/2006  Job #: [id number]   cc:   Consuello Masse

## 2010-09-02 NOTE — Assessment & Plan Note (Signed)
[place] OFFICE NOTE   ACIE, VENABLES                       MRN:          [id number]  DATE:02/23/2006                            DOB:          November 10, 1932    PRIMARY CARDIOLOGIST:  Ernestine Mcmurray, MD, [facility].   REASON FOR VISIT:  Preoperative cardiac clearance.   [name] is a 77 year old female, with a history of nonobstructive  coronary artery disease by cardiac catheterization in May of this year,  recently seen in the clinic by myself in July of this year, who now returns  in followup and for preoperative cardiac clearance. She has severe arthritis  of the right knee and is awaiting clearance to undergo total knee  replacement, by Dr. Shellia Carwin.   From a cardiac standpoint, the patient was referred for an Adenosine stress  test when I last saw her. This was a low risk study with no definite  evidence of ischemia and normal left ventricular function.   The patient reports no significant change from her baseline since last seen  here in the clinic, and continues to have atypical chest pain. She also has  obstructive sleep apnea, does use CPAP at night, but appears to have some  difficulty with the fitting of the mask.   At time of last visit, I had also recommend a fasting lipid profile given  her history of hyperlipidemia and Lipitor intolerance. However, she did not  followup for the blood work.   CURRENT MEDICATIONS:  1. Coated aspirin 81 daily.  2. Protonix 40 daily.  3. Toprol XL 50 daily.  4. Imdur 60 daily.   PHYSICAL EXAMINATION:  VITAL SIGNS:  Blood pressure 152/78, pulse 64,  weight. Weight 179.  GENERAL:  A 77 year old female sitting upright in no apparent distress.  HEENT:  Normocephalic, atraumatic.  NECK:  Palpable bilateral carotid pulses without bruits; no JVD.  LUNGS:  Clear to auscultation all fields.  HEART:  Regular rate and rhythm (S1, S2), no murmurs.  ABDOMEN:   Soft, nontender with intact bowel sounds.  EXTREMITIES:  Palpable distal pulses with no significant edema.  NEUROLOGIC:  No focal deficit.   IMPRESSION:  1. Nonobstructive simple vessel coronary artery disease.      a.     Cardiac catheterization May 2007.      b.     Low risk adenosine stress Cardiolite; ejection fraction 67% July       2007.  2. Chronic obstructive pulmonary disease.      a.     History of tobacco.  3. Hyperlipidemia.      a.     Lipitor intolerant.  4. Right knee arthritis.  5. Hypertension.   PLAN:  The patient is clear to proceed with knee surgery, as planned. She is  at low risk from a cardiovascular standpoint for perioperative cardiac  events. Aspirin will need to be resumed following surgery, once she is  cleared to do so. We also recommend that she remain on beta blocker during  her perioperative course.   We will reschedule  her for a fasting lipid profile in the next several days,  at her convenience. We will then have her return to the clinic in one month  for postop followup. Of note, her blood pressure is a bit elevated today and  we will reassess this at time of her followup and consider adjusting her  current medication regimen.      [name], PA-C  Electronically Signed      Ernestine Mcmurray, MD,FACC  Electronically Signed   GS/MedQ  DD: 02/23/2006  DT: 02/23/2006  Job #: [id number]   cc:   Tarri Glenn, M.D.

## 2010-09-02 NOTE — Cardiovascular Report (Signed)
NAMEKAWTHAR, OSUCH NO.:  [id number]   MEDICAL RECORD NO.:  [id number]          PATIENT TYPE:  OIB   LOCATION:  2887                         FACILITY:  [facility]   PHYSICIAN:  Glori Bickers, M.D. [facility] OF BIRTH:  May 12, 1932   DATE OF PROCEDURE:  08/19/2004  DATE OF DISCHARGE:                              CARDIAC CATHETERIZATION   PRIMARY CARE PHYSICIAN:  Dr. Tarri Glenn   CARDIOLOGIST:  Dr. Terald Sleeper   PATIENT ID:  Ms. Haab is a very pleasant 77 year old woman with history of  COPD, but no known coronary disease who was admitted to [facility] with  atypical chest pain in the setting of bronchitis and extreme emotional  stress over her son's recent attempted suicide.  EKG and cardiac enzymes are  negative.  She was referred for adenosine Cardiolite which in reviewing the  study showed poor quality of the stress images.  However, there was some  question of inferior apical ischemia.   PROCEDURES PERFORMED:  1.  Selective coronary angiography.  2.  Left heart catheterization.  3.  Left ventriculogram.   DESCRIPTION OF PROCEDURE:  The risks and benefits of catheterization were  explained to Ms. Wynetta Emery.  Consent was signed and placed on the chart.  The  right groin area was prepped and draped in routine sterile fashion.  It was  then anesthetized with 1% local lidocaine.  A 6-French arterial sheath was  placed in the right femoral artery using a modified Seldinger technique.  Standard preformed catheters including JL4, JR4, and angled pigtail were  used for the catheterization.  All catheter exchanges were made over a wire.  There were no apparent complications.  At the end of the procedure patient  was transferred to the holding area in stable condition for removal of her  arterial access.   FINDINGS:  Central aortic pressure is 122/61 with a mean of 84.  LV was  117/2 with an EDP of 6.  There is no gradient on aortic valve pullback.   Left main had a distal  25% lesion.   LAD was a moderate sized vessel that wrapped the apex.  It gave off one  normal sized diagonal.  The proximal LAD was heavily calcified.  There was a  60% tubular stenosis prior to the takeoff of the first diagonal.  However,  there was still good flow down the LAD with approximately 2 mm lumen.  There  was diffuse moderate disease in the distal LAD.   Left circumflex was a very large, dominant vessel.  It gave off a small OM1,  a moderate sized branching OM2, small to moderate OM3, and a small PDA.  There was 30% tubular lesion in the proximal left circumflex.  There was 70-  75% lesion in the lower branch of the OM2.  There was a 40% lesion in the  distal AV groove circumflex prior to takeoff of the OM3 and there was a 95%  lesion in the mid portion of a small to moderate sized OM3.  There was about  a 1.5-2 mm vessel.   Right coronary artery was  a small, nondominant vessel that was diffusely  diseased.   Left ventriculogram done in the RAO approach showed an EF of 60% with no  apparent wall motion abnormalities or mitral regurgitation.   ASSESSMENT:  1.  Left anterior descending 60% proximal lesion which is heavily calcified.  2.  Left circumflex with a 70-75% lesion in the obtuse marginal 2 and a 95%      lesion in the small obtuse marginal 3.  3.  Right coronary artery is a small, nondominant vessel with diffuse      disease.  4.  Normal left ventricular function without any significant mitral      regurgitation or wall motion abnormalities.  5.  Atypical chest pain.   PLAN:  I have reviewed the catheterization films with Dr. Vicenta Aly, our  interventionalist.  The LAD lesion is heavily calcified and likely not flow-  limiting as there is still patent 2 mm lesion proximally.  The OM2 lesion is  a bit tighter, but does not appear critical or flow-limiting at this point.  The OM3 lesion is tight, but in a small vessel.   At this time we will plan to proceed with  aggressive medical therapy.  If  she develops convincing exertional angina, can consider PCI of the OM2  lesion.  Will plan for discharge today if she remains stable.      DB/MEDQ  D:  08/19/2004  T:  08/19/2004  Job:  [id number]

## 2010-09-02 NOTE — Assessment & Plan Note (Signed)
[facility]                            [facility] OFFICE NOTE   Tonya, [name]                         MRN:          [id number]  DATE:10/27/2005                            DOB:          [birth year]    PRIMARY CARDIOLOGIST:  Dr. Terald Sleeper   REASON FOR OFFICE VISIT:  [name] is a 77 year old female with known non-  obstructive coronary artery disease by previous cardiac catheterization in  May 2006 who most recently was referred for a repeat study secondary to  recurrent angina pectoris and exertional dyspnea.   Cardiac catheterization was performed in May of this year and revealed  stable, moderate non-obstructive CAD with a 60-70% proximal lesion which  appeared unchanged from the previous year's study.  Residual anatomy notable  for 30% proximal and 50% mid circumflex disease in a normal, small,  nondominant right coronary artery.  LV function was normal (50-55%) with no  wall motion abnormalities.   Distal aortogram revealed moderate diffuse disease with a very small  dissection of the right internal iliac artery which was deemed stable.   Of note, Dr. Haroldine Laws suggested a follow-up adenosine Myoview if patient  had recurrent pain.   Patient now presents in follow-up at this late date secondary to previous  schedule conflicts.  From a clinical standpoint she continues to complain of  chest pain which she describes as sharp and is not entirely correlated with  exertion.  In fact, often times these occur while lying supine in bed.  She  does report some associated dyspnea, however.   Of note, patient also has reflux disease and is on Protonix.   Patient also reports having taken herself off Lipitor secondary to the  development of a rash.  She has not tried any other lipid-lowering agents.   Electrocardiogram today reveals sinus bradycardia at 55 BPM with left axis  deviation and low voltage with no ischemic changes.   CURRENT  MEDICATIONS:  1.  Aspirin 81 daily.  2.  Protonix 40 daily.  3.  Cymbalta 60 daily.  4.  Caltrate 600/vitamin D.  5.  Toprol XL 50 daily.  6.  Imdur 60 daily.   PHYSICAL EXAMINATION:  VITAL SIGNS:  Blood pressure 130/70, pulse 55,  regular, weight 174.  GENERAL:  A 77 year old female sitting upright in no apparent distress.  HEENT:  Normocephalic, atraumatic.  NECK:  Palpable carotid pulses without bruits.  LUNGS:  Clear to auscultation all fields.  HEART:  Regular rate and rhythm (S1, S2).  No murmurs.  ABDOMEN:  Soft, nontender.  EXTREMITIES:  Right groin is stable with no evidence of ecchymosis, no  hematoma, and no bruit on auscultation; intact femoral and preserved distal  pulses with no significant edema.   IMPRESSION:  1.  Recurrent atypical chest pain.      1.  Status post recent cardiac catheterization revealing stable,          moderate disease of the left anterior descending.      2.  Normal left ventricular function.      3.  Non-obstructive  coronary artery disease by cardiac catheterization          May 2006.  2.  Asymptomatic sinus bradycardia.  3.  Degenerative joint disease of the right knee.  4.  History of tobacco.      1.  Patient reportedly quit approximately four years ago.  5.  Hyperlipidemia.      1.  Intolerant to Lipitor secondary to rash.  6.  Gastroesophageal reflux disease.  7.  History of lower extremity discomfort.      1.  Normal lower extremity arterial Dopplers May 2006.   PLAN:  1.  Schedule an adenosine stress Myoview to further assess the LAD territory      to exclude ischemia.  If this is negative then we will continue medical      therapy and consider other etiologies for her recurrent chest pain.  2.  Check a fasting lipid profile at time of her stress test.  We may need      to consider other agents (i.e., Zetia) if her LDL is not at goal of 70      or less.  3.  Return to the clinic to follow up with me in two weeks for review of       test results.                                   [name], PA-C                                Ernestine Mcmurray, MD, [facility]   GS/MedQ  DD:  10/27/2005  DT:  10/27/2005  Job #:  [id number]   cc:   Sandford Craze

## 2010-09-02 NOTE — Cardiovascular Report (Signed)
NAME:  [name], [name] NO.:  [id number]   MEDICAL RECORD NO.:  [id number]          PATIENT TYPE:  OIB   LOCATION:  1965                         FACILITY:  [facility]   PHYSICIAN:  Glori Bickers, M.D. LHCDATE OF BIRTH:  1932-06-29   DATE OF PROCEDURE:  09/01/2005  DATE OF DISCHARGE:                              CARDIAC CATHETERIZATION   CARDIOLOGIST:  Ernestine Mcmurray, M.D. [facility]   PRIMARY CARE PHYSICIAN:  Dr. Sandford [name]   PATIENT IDENTIFICATION:  [name] is a 77 year old with multiple medical  problems including moderate nonobstructive coronary artery disease by  cardiac catheterization 1 year ago which showed a 60-70% lesion in her  proximal LAD.  She also has a history of previous tobacco use with COPD,  hypertension, and hyperlipidemia.  She reports a several-month history of  progressive dyspnea on exertion, as well as exertional chest pain.  She is  thus referred for cardiac catheterization in the outpatient catheterization  lab.   PROCEDURES PERFORMED:  1.  Selective coronary angiography.  2.  Left heart cath.  3.  Left ventriculogram.  4.  Abdominal aortogram.  5.  Right heart catheterization.   DESCRIPTION OF PROCEDURE:  A 7-French sheath was placed in right femoral  vein using a modified Seldinger technique.  Initially a 4-French sheath was  placed in the right femoral artery with good blood return.  However, on  subsequent attempts to flush it, the catheter would no longer flush.  Thus,  this sheath was removed and another 4-French sheath was placed using a  modified Seldinger technique.  Of note, there was a significant difficulty  advancing the wire through the iliac system; and a Wholey wire was used.  Standard catheter was used JL-4 through the RC, and angled pigtail used for  the procedure.   COMPLICATIONS:  There was a small dissection of the right common femoral  artery which was non-flow limiting.   FINDINGS:  Right atrial pressure  mean of 8.  RV pressure 30/4.  PA pressure  33/10 with mean of 23.  Pulmonary capillary wedge pressure 12.  Central  aortic pressure 162/75 with a mean of 109.  LV pressure was 163/6 with an  EDP of 11.  There is no aortic stenosis.  PA saturations were 62 and 63% on  room air.  Femoral artery saturation was 96%.  Fick cardiac output was 2.4  liters per minute.  Cardiac index was 1.5 liters per minute per meter  squared.  Thermodilution cardiac output is 4.7 liters per minute.  Cardiac  index was 3.0 liters per minute per meter squared.   CORONARY ANATOMY:  1.  The coronaries are significantly calcified.  The left main is normal.      LAD is a long vessel wrapping the apex and it gives off a large      diagonal in the proximal portion.  The LAD and with a 30-40% lesion in      the midsection of the LAD.  Once again, there is a 60-70% calcified      lesion in the proximal portion which  does not appear flow-limiting; this      appears unchanged from last year's catheterization.  2.  The left circumflex is a large dominant vessel.  It gives off a small OM-      1, moderate-sized OM-2.  A moderate to large first posterolateral, and a      moderate-to-large PDA.  There is a 30% lesion in the proximal circumflex      and a 50% lesion in the mid circumflex.  3.  The right coronary artery is a small nondominant vessel with moderate      coronary atherosclerosis.   LEFT VENTRICULOGRAM:  A left ventriculogram done in the RAO position shows  an EF of 50-55% with no wall motion abnormalities.   ABDOMINAL AORTOGRAM:  Shows patent renal arteries bilaterally.  There is  moderate diffuse disease of the abdominal aortic iliac system.  There is a  very small dissection of the right internal iliac which is likely related to  multiple attempts to pass the wire through the femoral iliac system.  This  appears stable.   ASSESSMENT:  1.  Stable nonobstructive coronary disease without flow-limiting of  lesions.      There is a moderate lesion in the proximal LAD but this is unchanged      since [id number].  2.  Low normal LV ejection fraction.  3.  Moderate peripheral arterial disease as described above.  4.  Significant discordance between the thermodilution and the Fick cardiac      output.  I suspect the thermodilution is probably more accurate; as I      suspect the oxygen consumption in the Fick equation may be off.  5.  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   PLAN:  At this point we will continue medical therapy.  If her chest pain  continues would consider an adenosine Myoview to further evaluate the  hemodynamic significance of LAD lesion.      Glori Bickers, M.D. [facility]  Electronically Signed     DB/MEDQ  D:  09/01/2005  T:  09/01/2005  Job:  [id number]   cc:   [name]  Fax: [phone]

## 2010-09-02 NOTE — Op Note (Signed)
NAME:  [name], [name]                          ACCOUNT NO.:  [id number]   MEDICAL RECORD NO.:  [id number]                   PATIENT TYPE:  AMB   LOCATION:  DAY                                  FACILITY:  [facility]   PHYSICIAN:  R. Garfield Cornea, M.D.              DATE OF BIRTH:  18-Dec-1932   DATE OF PROCEDURE:  01/27/2003  DATE OF DISCHARGE:                                 OPERATIVE REPORT   PROCEDURE:  Esophagogastroduodenoscopy with Venia Minks dilation.   ENDOSCOPIST:  Cristopher Estimable. Rourk, M.D.   INDICATIONS FOR PROCEDURE:  The patient is a 77 year old lady with recurrent  esophageal dysphagia to solid food. She has a history of Schatzki ring and  underwent dilation back in June 2003.  She only takes her acid suppression  therapy sporadically and has gained 17 pounds since I last saw her.  EGD is  now being done to further evaluate her symptoms.  This approach has been  discussed with the patient at length previously; and, again today, at the  bedside.  The potential risks, benefits, and alternatives have been  reviewed. Please see my dictated January 20, 2003 H&P.   PROCEDURE NOTE:  O2 saturation, blood pressure, pulse and respirations were  monitored throughout the entire procedure.  Conscious sedation: Versed 2 mg  IV, Demerol 50 mg IV. Cetacaine spray for topical oropharyngeal anesthesia.   INSTRUMENT:  Olympus video chip adult gastroscope.   FINDINGS:  Examination of the tubular esophagus revealed what appeared to be  a noncritical Schatzki's ring.  Esophageal mucosa otherwise appeared to be  normal.  The EG junction was easily traversed.   STOMACH:  The gastric cavity was empty.  It insufflated well with air.  A  thorough examination of the gastric mucosa including a retroflex view of the  proximal stomach and esophagogastric junction revealed no abnormalities.  The pylorus was patent and easily traversed.   DUODENUM:  The bulb and the second portion appeared normal.   THERAPEUTIC/DIAGNOSTIC MANEUVERS:  A 56 French Maloney dilator was passed to  full insertion with good patient tolerance and without blood return on the  dilator.  A look back revealed that the ring had been ruptured without  apparent complication.   The patient tolerated the procedure well and was reacted in endoscopy.   IMPRESSION:  1. Schatzki ring, otherwise normal esophagus, status post dilation as     described above.  2. Normal stomach, normal D1 and D2.    RECOMMENDATIONS:  1. Antireflux life style/diet emphasized, weight loss encouraged.  2. The patient is to get back on Aciphex 20 mg orally daily.  3. We will plan to see this nice lady back in the office in 6 months.      ___________________________________________  Bridgette Habermann, M.D.   RMR/MEDQ  D:  01/27/2003  T:  01/27/2003  Job:  [id number]   cc:   Sandford Craze  [place]  [place] [address]  Fax: [phone]

## 2010-09-02 NOTE — Op Note (Signed)
[facility]  Patient:    [name], [name] Visit Number: [id number] MRN: [id number]          Service Type: END Location: DAY Attending Physician:  Bridgette Habermann Dictated by:   Garfield Cornea, M.D. Proc. Date: 09/27/01 Admit Date:  09/27/2001   CC:         Sandford Craze, M.D.,  [facility]   Operative Report  PROCEDURE:  Esophagogastroduodenoscopy with Venia Minks dilation followed by biopsy followed by colonoscopy with biopsy and stool sampling.  INDICATIONS FOR PROCEDURE:  The patient is a 77 year old lady followed primarily by Dr. Tarri Glenn with a one year history of nonbloody diarrhea. She also has significant gastroesophageal reflux symptoms and intermittent esophageal dysphagia. EGD and colonoscopy are now being done to further evaluate her symptoms. This approach has been discussed with the patient previously in my office and again today at the bedside. The potential risks, benefits, and alternatives have been reviewed, questions answered. She is agreeable with this. She is low risk for conscious sedation and we have Versed and Demerol.  MONITORING:  O2 saturations, blood pressure, pulse and respirations monitored throughout the entirety of both procedures.  CONSCIOUS SEDATION FOR BOTH PROCEDURES:  Versed 5 mg IV, Demerol 100 mg IV in divided doses. Cetacaine spray for topical oropharyngeal anesthesia.  INSTRUMENT:  Olympus video chip gastroscope and colonoscope.  EGD FINDINGS:  Examination of the tubular esophagus revealed multiple distal esophageal erosions. There was no evidence of Barretts esophagus. There was an indistinct Schatzkis ring. There was no stricture and no neoplasm. The EG junction was easily traversed.  STOMACH:  The gastric cavity insufflated well with air. A thorough examination of the gastric mucosa including a retroflexed view of the proximal stomach and esophagogastric junction demonstrated no  abnormalities. The pylorus was patent and easily traversed.  DUODENUM:  The bulb and second portion appeared normal.  THERAPEUTIC/DIAGNOSTIC MANEUVERS PERFORMED:  A 56 French Maloney dilator was passed to full insertion. A look back revealed no apparent complications related to passage of the dilator. Subsequently biopsies of the second and third portion of the duodenum were taken to evaluate this lady for celiac disease.  The patient tolerated the procedure very well and was prepared for colonoscopy.  A digital rectal exam revealed no abnormalities.  ENDOSCOPIC FINDINGS:  The prep was adequate.  RECTUM:  Examination of the rectal mucosal including retroflexed view of the anal verge revealed some internal hemorrhoids and two 5 mm diminutive polyps and at 4 cm. These were cold biopsied/removed. The rectal mucosa was biopsied separately to evaluate for microscopic proctitis.  COLON:  The colonic mucosa was surveyed from the rectosigmoid junction through the left transverse right colon to the area of the appendiceal orifice, ileocecal valve, and cecum. These structures were well seen and photographed for the record.  The patient was noted to have a 5 mm polyp in the mid descending colon. The remainder of the colonic mucosa all the way to the cecum appeared normal. Please see photos. From the level of the cecum and ileocecal valve, the scope was slowly withdrawn. All previously mentioned mucosal surfaces were again seen. Stool residue was suctioned for microbiology studies. The polyp in the descending colon was cold biopsied/removed. Segmental biopsies of the descending and sigmoid mucosa were also taken to evaluate further for microscopic colitis.  The patient tolerated both procedures well and was reacted in endoscopy.  IMPRESSION EGD:  1. Distal esophageal erosions consistent with mild erosive reflux  esophagitis.  2. Indistinct Schatzkis ring status post dilation as  described above. The     remainder of her esophagus, stomach and duodenum appeared normal.     Status post biopsies of the second portion of the duodenum.  COLONOSCOPY FINDINGS:  1. Internal hemorrhoids, diminutive rectal polyps cold biopsied/removed.  2. Polyp in the mid descending colon. The remainder of the colonic mucosa     appeared normal. Stool sample taken. Biopsies of the polyps and segmental     biopsies of the descending sigmoid and rectal mucosa taken throughout     microscopic colitis.  RECOMMENDATIONS:  1. Begin Aciphex 20 mg orally daily for gastroesophageal reflux disease. The     patient is to go by my office and obtain free samples.  2. Will follow-up on biopsies and stool studies from today and then will     make further recommendations. Dictated by:   Garfield Cornea, M.D.  Attending Physician:  Bridgette Habermann DD:  09/27/01 TD:  09/29/01 Job: [id number]

## 2010-09-23 ENCOUNTER — Encounter: Payer: Self-pay | Admitting: Cardiology

## 2010-09-27 DIAGNOSIS — G5600 Carpal tunnel syndrome, unspecified upper limb: Secondary | ICD-10-CM | POA: Insufficient documentation

## 2010-09-27 DIAGNOSIS — K580 Irritable bowel syndrome with diarrhea: Secondary | ICD-10-CM | POA: Insufficient documentation

## 2010-09-28 ENCOUNTER — Ambulatory Visit (INDEPENDENT_AMBULATORY_CARE_PROVIDER_SITE_OTHER): Payer: Medicare Other | Admitting: Cardiology

## 2010-09-28 ENCOUNTER — Encounter: Payer: Self-pay | Admitting: Cardiology

## 2010-09-28 VITALS — BP 138/79 | HR 77 | Ht 61.0 in | Wt 185.0 lb

## 2010-09-28 DIAGNOSIS — I739 Peripheral vascular disease, unspecified: Secondary | ICD-10-CM

## 2010-09-28 DIAGNOSIS — F329 Major depressive disorder, single episode, unspecified: Secondary | ICD-10-CM

## 2010-09-28 MED ORDER — TRAZODONE HCL 50 MG PO TABS: 50.0000 mg | ORAL_TABLET | Freq: Every day | ORAL | Status: AC

## 2010-09-28 NOTE — Patient Instructions (Signed)
Your physician recommends you follow up with him in 6 months. You will receive a reminder letter about 2 months in advance. Start trazodone 50mg  daily at bedtime for sleep.

## 2010-09-28 NOTE — Assessment & Plan Note (Signed)
No evidence of volume overload. 

## 2010-09-28 NOTE — Assessment & Plan Note (Signed)
Nonobstructive coronary disease: Last catheterization was November 2010 which showed moderate LAD and circumflex disease. This was followed by Myoview study at [facility] which showed no ischemia. The LAD had approximately 50-60% stenosis proximally. No recurrent chest pain. Continue current medical therapy

## 2010-09-28 NOTE — Assessment & Plan Note (Signed)
The patient's depression is quite severe she has been on Cymbalta and takes alprazolam for her anxiety attacks. She is more depressed and has significant insomnia. I added trazodone 50 mg by mouth each bedtimeboth as a sleeping aid as well as augmentation therapy for her depression.

## 2010-09-28 NOTE — Assessment & Plan Note (Signed)
Patient reports no claudication 

## 2010-09-28 NOTE — Progress Notes (Signed)
HPI The patient is a 77 year old female with history of nonobstructive coronary artery disease documented by multiple prior cardiac catheterizations. She has normal LV function. She has a history of chronic dyspnea and has been evaluated by Dr. Koleen Nimrod for COPD. She underwent a methacholine challenge test which was negative FEV1 is 1.85 L the patient has a history of atypical left-sided chest pain. Most of the time her symptoms in the past that improved with alprazolam she also is chronic pain with right elbow and right shoulder pain and associated numbness. Her last office visit was in September 2011. From a cardiac standpoint the last office visit we felt she was stable. The patient has been feeling very depressed. Her daughter has been diagnosed with breast cancer. She feels like she has no energy anymore. She doesn't feel like reading or watching TV anymore. She only sleeps 3 hours a night. She still has shortness of breath but this is somewhat chronic. She gives out on exertion quite easily with significant fatigue. She was seen in the emergency room for injury to her left arm which has resolved. The patient reports occasional falling spells without frank syncope.  Allergies  Allergen Reactions  . Codeine   . Ofloxacin     REACTION: RESPIRATORY DIFFICULTY  . Prednisone     Current Outpatient Prescriptions on File Prior to Visit  Medication Sig Dispense Refill  . ALPRAZolam (XANAX) 0.25 MG tablet Take 0.25 mg by mouth at bedtime as needed. May take BID or TID prn for anxiety       . aspirin 81 MG EC tablet Take 81 mg by mouth daily.        . Cranberry-Vitamin C-Vitamin E (CRANBERRY PLUS VITAMIN C) 140-100-3 MG-MG-UNIT CAPS Take 2 capsules by mouth daily.        . DULoxetine (CYMBALTA) 60 MG capsule Take 60 mg by mouth daily.        . fluticasone (FLOVENT DISKUS) 50 MCG/BLIST diskus inhaler Inhale 2 puffs into the lungs 2 (two) times daily.        . furosemide (LASIX) 20 MG tablet Take 20  mg by mouth daily.        . isosorbide mononitrate (IMDUR) 120 MG 24 hr tablet Take 120 mg by mouth daily.        [name] Kitchen loperamide (IMODIUM A-D) 2 MG tablet Take 2 mg by mouth 4 (four) times daily as needed.        . metoprolol (LOPRESSOR) 50 MG tablet Take 12.5 mg by mouth 2 (two) times daily.       . Multiple Vitamins-Minerals (CENTRUM) tablet Take 1 tablet by mouth daily.        . nitroGLYCERIN (NITROSTAT) 0.4 MG SL tablet Place 0.4 mg under the tongue every 5 (five) minutes as needed.        . Omega-3 Fatty Acids (FISH OIL) 1000 MG CAPS Take 1 capsule by mouth daily.        . pantoprazole (PROTONIX) 40 MG tablet Take 40 mg by mouth daily.        . traMADol (ULTRAM) 50 MG tablet Take 50 mg by mouth 2 (two) times daily as needed.        . vitamin E 400 UNIT capsule Take 400 Units by mouth daily.        [name] Kitchen DISCONTD: oxybutynin (DITROPAN) 5 MG tablet Take 5 mg by mouth 2 (two) times daily.          Past Medical History  Diagnosis  Date  . PVD (peripheral vascular disease)   . Other and unspecified hyperlipidemia   . Coronary atherosclerosis of native coronary artery     nonobstructive CAD by multiple catheterizations  . Panic disorder without agoraphobia   . HTN (hypertension)     Hx of it  . MI (myocardial infarction)   . Internal hemorrhoids without mention of complication   . Dysphagia, unspecified   . Colitis   . GERD (gastroesophageal reflux disease)     Past Surgical History  Procedure Date  . Tonsillectomy   . Unspecified area, hysterectomy   . Cholecystectomy   . Right knee replacement   . Right leg benign tumor   . Breast lumpectomy     left  . Left hand surgery   . Left rotator cuff surgery   . Nasal sinus surgery     Family History  Problem Relation Age of Onset  . Coronary artery disease Other     family Hx  . Stroke Other     family Hx  . Hypertension Other     family Hx    History   Social History  . Marital Status: Divorced    Spouse Name: N/A     Number of Children: N/A  . Years of Education: N/A   Occupational History  . Not on file.   Social History Main Topics  . Smoking status: Former Smoker -- 1.0 packs/day for 64 years    Types: Cigarettes    Quit date: 11/17/2001  . Smokeless tobacco: Never Used   Comment: no tobacco  . Alcohol Use: No  . Drug Use: Not on file  . Sexually Active: Not on file   Other Topics Concern  . Not on file   Social History Narrative   Divorced, full time.    [id number] positives as outlined above. The remainder of the 18  point review of systems is negative   PHYSICAL EXAM BP 138/79  Pulse 77  Ht 5\' 1"  (1.549 m)  Wt 185 lb (83.915 kg)  BMI 34.96 kg/m2  General: Well-developed, well-nourished in no distress Head: Normocephalic and atraumatic Eyes:PERRLA/EOMI intact, conjunctiva and lids normal Ears: No deformity or lesions Mouth:normal dentition, normal posterior pharynx Neck: Supple, no JVD.  No masses, thyromegaly or abnormal cervical nodes Lungs: Diminished breath sounds bilaterally without wheezing.  Normal percussion Cardiac: regular rate and rhythm with normal S1 and S2, no S3 or S4.  PMI is normal.  No pathological murmurs Abdomen: Normal bowel sounds, abdomen is soft and nontender without masses, organomegaly or hernias noted.  No hepatosplenomegaly MSK: Back normal, normal gait muscle strength and tone normal Vascular: Pulse is normal in all 4 extremities Extremities: No peripheral pitting edema Neurologic: Alert and oriented x 3 Skin: Intact without lesions or rashes Lymphatics: No significant adenopathy Psychologic: Severely depressed affect  ECG:  ASSESSMENT AND PLAN

## 2011-01-26 LAB — CBC
MCHC: 34
MCHC: 34.4
MCV: 84.6
Platelets: 220
RBC: 4.4
RBC: 4.42
RDW: 13.9
WBC: 6.1

## 2011-01-26 LAB — BASIC METABOLIC PANEL
CO2: 26
Calcium: 9
Creatinine, Ser: 0.98
GFR calc Af Amer: >60
GFR calc non Af Amer: 56 — ABNORMAL LOW

## 2011-01-26 LAB — CARDIAC PANEL(CRET KIN+CKTOT+MB+TROPI)
CK, MB: 1.6
Total CK: 96

## 2011-01-26 LAB — TSH: TSH: 1.978

## 2011-02-13 ENCOUNTER — Ambulatory Visit (INDEPENDENT_AMBULATORY_CARE_PROVIDER_SITE_OTHER): Payer: Medicare Other | Admitting: Urgent Care

## 2011-02-13 ENCOUNTER — Encounter: Payer: Self-pay | Admitting: Urgent Care

## 2011-02-13 DIAGNOSIS — K921 Melena: Secondary | ICD-10-CM | POA: Insufficient documentation

## 2011-02-13 DIAGNOSIS — R109 Unspecified abdominal pain: Secondary | ICD-10-CM | POA: Insufficient documentation

## 2011-02-13 DIAGNOSIS — R1030 Lower abdominal pain, unspecified: Secondary | ICD-10-CM | POA: Insufficient documentation

## 2011-02-13 DIAGNOSIS — R1310 Dysphagia, unspecified: Secondary | ICD-10-CM

## 2011-02-13 DIAGNOSIS — K219 Gastro-esophageal reflux disease without esophagitis: Secondary | ICD-10-CM

## 2011-02-13 NOTE — Progress Notes (Signed)
Primary Care Physician:  Tawni Carnes, PA, [place] Primary Gastroenterologist:  Dr. Gala Romney  Chief Complaint  Patient presents with  . Rectal Bleeding  . Abdominal Pain    HPI:  [name] is a 77 y.o. female here for further evaluation of several concerns including abdominal pain, dysphagia, GERD & rectal bleeding.  She has hx microscopic colitis yrs ago.  C/o severe bilat LQ & flank pain for several months.  C/o Swollen hemorrhoids.  Intermittent bright red bleeding in small amts on tissue & in stool.  C/o lower abd pain "pulling" 10/10.  Takes breath away, worse w/ walking.  BM QOD.  Worse w/ BM.  Not associated w/ eating.  Temp 100 at home.   Denies any upper GI symptoms including heartburn, indigestion.  +Nausea.  No vomiting.  "Getting strangled."  Mostly problems w/ liquids.  Solids ok if she eats small bites.  Diarrhea comes & goes-imodium helps.    Past Medical History  Diagnosis Date  . PVD (peripheral vascular disease)   . Other and unspecified hyperlipidemia   . Coronary atherosclerosis of native coronary artery     nonobstructive CAD by multiple catheterizations  . Panic disorder without agoraphobia   . HTN (hypertension)     Hx of it  . MI (myocardial infarction) 2006  . Internal hemorrhoids without mention of complication   . Dysphagia, unspecified   . Microscopic colitis 2003  . GERD (gastroesophageal reflux disease)     Hx Schatski's ring, multiple EGD/ED last 01/06/2004  . Hyperlipemia   . Thyroid disease     recent abnl TSH per pt  . S/P colonoscopy 09/27/2001    internal hemorrhoids, desc colon inflam polyp, SB BX-chronic duodenitis, colitis    Past Surgical History  Procedure Date  . Tonsillectomy   . Unspecified area, hysterectomy     partial  . Cholecystectomy   . Right knee replacement   . Right leg benign tumor   . Breast lumpectomy     left  . Left hand surgery   . Left rotator cuff surgery   . Nasal sinus surgery   . Bladder tack     Current  Outpatient Prescriptions  Medication Sig Dispense Refill  . ALPRAZolam (XANAX) 0.25 MG tablet Take 0.25 mg by mouth at bedtime as needed. May take BID or TID prn for anxiety       . aspirin 81 MG EC tablet Take 81 mg by mouth daily.        . Cranberry-Vitamin C-Vitamin E (CRANBERRY PLUS VITAMIN C) 140-100-3 MG-MG-UNIT CAPS Take 2 capsules by mouth daily.        . DULoxetine (CYMBALTA) 60 MG capsule Take 60 mg by mouth daily.        . isosorbide mononitrate (IMDUR) 120 MG 24 hr tablet Take 120 mg by mouth daily.        . metoprolol (LOPRESSOR) 50 MG tablet Take 12.5 mg by mouth 2 (two) times daily.       . nitroGLYCERIN (NITROSTAT) 0.4 MG SL tablet Place 0.4 mg under the tongue every 5 (five) minutes as needed.        . Omega-3 Fatty Acids (FISH OIL) 1000 MG CAPS Take 1 capsule by mouth daily.        . pantoprazole (PROTONIX) 40 MG tablet Take 40 mg by mouth daily.          Allergies as of 02/13/2011 - Review Complete 02/13/2011  Allergen Reaction Noted  . Codeine    .  Ofloxacin    . Prednisone      Family History:There is no known family history of colorectal carcinoma , liver disease, or inflammatory bowel disease.  Problem Relation Age of Onset  . Coronary artery disease Other     family Hx-sons  . Stroke Other     family Hx  . Hypertension Other     family Hx  . Diabetes Brother     History   Social History  . Marital Status: Divorced    Spouse Name: N/A    Number of Children: 39  . Years of Education: N/A   Occupational History  . retired    Social History Main Topics  . Smoking status: Former Smoker -- 1.0 packs/day for 64 years    Types: Cigarettes    Quit date: 11/17/2001  . Smokeless tobacco: Never Used   Comment: no tobacco  . Alcohol Use: No  . Drug Use: No  . Sexually Active: Not on file   Other Topics Concern  . Not on file   Social History Narrative   Divorced.Sister had colon perforation & died from complications in [place], [place]    Review of  Systems: See HPI, otherwise negative complete ROS  Physical Exam: BP 140/75  Pulse 71  Temp(Src) 97.1 F (36.2 C) (Temporal)  Ht 5\' 1"  (1.549 m)  Wt 187 lb (84.823 kg)  BMI 35.33 kg/m2 General:   Alert,  Well-developed, well-nourished, pleasant and cooperative in NAD Head:  Normocephalic and atraumatic. Eyes:  Sclera clear, no icterus.   Conjunctiva pink. Ears:  Normal auditory acuity. Nose:  No deformity, discharge,  or lesions. Mouth:  No deformity or lesions, OP pink/moist. Neck:  Supple; no masses or thyromegaly. Lungs:  Clear throughout to auscultation.   No wheezes, crackles, or rhonchi. No acute distress. Heart:  Regular rate and rhythm; no murmurs, clicks, rubs,  or gallops. Abdomen:  Soft and nondistended. +tenderness to bilat lower quads & umbilicus on palpation.  No masses, hepatosplenomegaly or hernias noted. Normal bowel sounds, without guarding, and without rebound.  Exam limited given body habitus. Rectal:  Deferred until time of colonoscopy.   Msk:  Symmetrical without gross deformities. Normal posture. Pulses:  Normal pulses noted. Extremities:  Without clubbing or edema. Neurologic:  Alert and  oriented x4;  grossly normal neurologically. Skin:  Intact without significant lesions or rashes. Cervical Nodes:  No significant cervical adenopathy. Psych:  Alert and cooperative. Normal mood and affect.

## 2011-02-13 NOTE — Patient Instructions (Signed)
Go directly to ER if severe pain We will call w/ lab results & CT results  Abdominal Pain Abdominal pain can be caused by many things. Your caregiver decides the seriousness of your pain by an examination and possibly blood tests and X-rays. Many cases can be observed and treated at home. Most abdominal pain is not caused by a disease and will probably improve without treatment. However, in many cases, more time must pass before a clear cause of the pain can be found. Before that point, it may not be known if you need more testing, or if hospitalization or surgery is needed. HOME CARE INSTRUCTIONS   Do not take laxatives unless directed by your caregiver.   Take pain medicine only as directed by your caregiver.   Only take over-the-counter or prescription medicines for pain, discomfort, or fever as directed by your caregiver.   Try a clear liquid diet (broth, tea, or water) for as long as directed by your caregiver. Slowly move to a bland diet as tolerated.  SEEK IMMEDIATE MEDICAL CARE IF:   The pain does not go away.   You have a fever.   You keep throwing up (vomiting).   The pain is felt only in portions of the abdomen. Pain in the right side could possibly be appendicitis. In an adult, pain in the left lower portion of the abdomen could be colitis or diverticulitis.   You pass bloody or black tarry stools.  MAKE SURE YOU:   Understand these instructions.   Will watch your condition.   Will get help right away if you are not doing well or get worse.  Document Released: 01/11/2005 Document Revised: 12/14/2010 Document Reviewed: 11/20/2007 [facility] Patient Information 2012 [place].

## 2011-02-14 ENCOUNTER — Encounter: Payer: Self-pay | Admitting: Urgent Care

## 2011-02-14 LAB — CBC WITH DIFFERENTIAL/PLATELET
Basophils Absolute: 0.1 10*3/uL (ref 0.0–0.1)
Basophils Relative: 1 % (ref 0–1)
Eosinophils Relative: 3 % (ref 0–5)
HCT: 39.4 % (ref 36.0–46.0)
MCHC: 33.8 g/dL (ref 30.0–36.0)
Monocytes Absolute: 0.5 10*3/uL (ref 0.1–1.0)
Neutro Abs: 4.6 10*3/uL (ref 1.7–7.7)
RDW: 14.2 % (ref 11.5–15.5)

## 2011-02-14 LAB — URINALYSIS W MICROSCOPIC + REFLEX CULTURE
Bilirubin Urine: NEGATIVE
Casts: NONE SEEN
Crystals: NONE SEEN
Glucose, UA: NEGATIVE mg/dL
Ketones, ur: NEGATIVE mg/dL
Specific Gravity, Urine: 1.021 (ref 1.005–1.030)
pH: 5 (ref 5.0–8.0)

## 2011-02-14 LAB — COMPREHENSIVE METABOLIC PANEL
AST: 18 U/L (ref 0–37)
Alkaline Phosphatase: 54 U/L (ref 39–117)
BUN: 21 mg/dL (ref 6–23)
Creat: 1.17 mg/dL — ABNORMAL HIGH (ref 0.50–1.10)
Potassium: 4.2 mEq/L (ref 3.5–5.3)

## 2011-02-14 LAB — LIPASE: Lipase: 25 U/L (ref 0–75)

## 2011-02-14 NOTE — Assessment & Plan Note (Addendum)
This is concerning.  Will most likely need colonoscopy to determine etiology since last colonoscopy over 3 yrs ago after CT for further evaluation.

## 2011-02-14 NOTE — Assessment & Plan Note (Addendum)
Severe abdominal pain & flank pain.  Differentials include diverticulitis, colitis, ischemia, UTI, less likely pancreatitis.    CBC, CMP, Lipase, amylase, UA CT a/p w/ IV/oral contrast if creatinine ok To ER if severe pain. Offered pain meds pt declined.

## 2011-02-14 NOTE — Assessment & Plan Note (Signed)
Hx esophageal dysphagia w/ hx Schatzki's ring & esophageal motility disorder. May require repeat esophageal dilation.

## 2011-02-14 NOTE — Assessment & Plan Note (Signed)
Well controlled on protonix 40mg  daily.

## 2011-02-15 ENCOUNTER — Other Ambulatory Visit: Payer: Self-pay | Admitting: Gastroenterology

## 2011-02-15 ENCOUNTER — Ambulatory Visit (HOSPITAL_COMMUNITY)
Admission: RE | Admit: 2011-02-15 | Discharge: 2011-02-15 | Disposition: A | Discharge status: Home / Self Care | Payer: Medicare Other | Source: Ambulatory Visit | Attending: Urgent Care | Admitting: Urgent Care

## 2011-02-15 ENCOUNTER — Encounter (HOSPITAL_COMMUNITY): Payer: Self-pay

## 2011-02-15 DIAGNOSIS — K219 Gastro-esophageal reflux disease without esophagitis: Secondary | ICD-10-CM

## 2011-02-15 DIAGNOSIS — R109 Unspecified abdominal pain: Secondary | ICD-10-CM | POA: Insufficient documentation

## 2011-02-15 DIAGNOSIS — R197 Diarrhea, unspecified: Secondary | ICD-10-CM | POA: Insufficient documentation

## 2011-02-15 DIAGNOSIS — K5289 Other specified noninfective gastroenteritis and colitis: Secondary | ICD-10-CM

## 2011-02-15 MED ORDER — IOHEXOL 300 MG/ML  SOLN
100.0000 mL | Freq: Once | INTRAMUSCULAR | Status: AC | PRN
  Administered 2011-02-15: 100 mL via INTRAVENOUS

## 2011-02-15 NOTE — Progress Notes (Signed)
Quick Note:    Reviewed with patient.  ______

## 2011-02-15 NOTE — Progress Notes (Signed)
Quick Note:  Reviewed all labs & CT w/ pt. Needs colonoscopy & EGD w/ possible ED w/ Dr Rutherford Limerick [id number] pain, hematochezia, hx microscopic colitis, dysphagia in OR. Procedure will need to be done with deep sedation (propofol) in the OR under the direction of anesthesia services for multiple psychoactive meds. I have discussed risks & benefits which include, but are not limited to, bleeding, infection, perforation & drug reaction. The patient agrees with this plan & written consent will be obtained.  Please arrange. Thanks   ______

## 2011-02-15 NOTE — Progress Notes (Signed)
Cc to PCP 

## 2011-03-07 ENCOUNTER — Encounter (HOSPITAL_COMMUNITY): Payer: Self-pay

## 2011-03-07 ENCOUNTER — Other Ambulatory Visit: Payer: Self-pay

## 2011-03-07 ENCOUNTER — Encounter (HOSPITAL_COMMUNITY)
Admission: RE | Admit: 2011-03-07 | Discharge: 2011-03-07 | Disposition: A | Discharge status: Home / Self Care | Payer: Medicare Other | Source: Ambulatory Visit | Attending: Internal Medicine | Admitting: Internal Medicine

## 2011-03-07 NOTE — Patient Instructions (Addendum)
[name]  03/07/2011   Your procedure is scheduled on: 03/16/2011  Report to [facility] at  945  AM.  Call this number if you have problems the morning of surgery: [phone]   Remember:   Do not eat food:After Midnight.  Do not drink clear liquids: After Midnight.  Take these medicines the morning of surgery with A SIP OF WATER:  Xanax,cymbalta,isosorbide,metoprolol,protonix   Do not wear jewelry, make-up or nail polish.  Do not wear lotions, powders, or perfumes. You may wear deodorant.  Do not shave 48 hours prior to surgery.  Do not bring valuables to the hospital.  Contacts, dentures or bridgework may not be worn into surgery.  Leave suitcase in the car. After surgery it may be brought to your room.  For patients admitted to the hospital, checkout time is 11:00 AM the day of discharge.   Patients discharged the day of surgery will not be allowed to drive home.  Name and phone number of your driver: family  Special Instructions: CHG Shower Shower 2 days before surgery and 1 day before surgery with Hibiclens.   Please read over the following fact sheets that you were given: Pain Booklet, Surgical Site Infection Prevention, Anesthesia Post-op Instructions and Care and Recovery After Surgery Esophagogastroduodenoscopy This is an endoscopic procedure (a procedure that uses a device like a flexible telescope) that allows your caregiver to view the upper stomach and small bowel. This test allows your caregiver to look at the esophagus. The esophagus carries food from your mouth to your stomach. They can also look at your duodenum. This is the first part of the small intestine that attaches to the stomach. This test is used to detect problems in the bowel such as ulcers and inflammation. PREPARATION FOR TEST Nothing to eat after midnight the day before the test. NORMAL FINDINGS Normal esophagus, stomach, and duodenum. Ranges for normal findings may vary among different  laboratories and hospitals. You should always check with your doctor after having lab work or other tests done to discuss the meaning of your test results and whether your values are considered within normal limits. MEANING OF TEST  Your caregiver will go over the test results with you and discuss the importance and meaning of your results, as well as treatment options and the need for additional tests if necessary. OBTAINING THE TEST RESULTS It is your responsibility to obtain your test results. Ask the lab or department performing the test when and how you will get your results. Document Released: 08/04/2004 Document Revised: 12/14/2010 Document Reviewed: 03/13/2008 [facility] Patient Information 2012 [place].olonoscopy A colonoscopy is an exam to evaluate your entire colon. In this exam, your colon is cleansed. A long fiberoptic tube is inserted through your rectum and into your colon. The fiberoptic scope (endoscope) is a long bundle of enclosed and very flexible fibers. These fibers transmit light to the area examined and send images from that area to your caregiver. Discomfort is usually minimal. You may be given a drug to help you sleep (sedative) during or prior to the procedure. This exam helps to detect lumps (tumors), polyps, inflammation, and areas of bleeding. Your caregiver may also take a small piece of tissue (biopsy) that will be examined under a microscope. LET YOUR CAREGIVER KNOW ABOUT:   Allergies to food or medicine.   Medicines taken, including vitamins, herbs, eyedrops, over-the-counter medicines, and creams.   Use of steroids (by mouth or creams).   Previous problems with anesthetics  or numbing medicines.   History of bleeding problems or blood clots.   Previous surgery.   Other health problems, including diabetes and kidney problems.   Possibility of pregnancy, if this applies.  BEFORE THE PROCEDURE   A clear liquid diet may be required for 2 days before the  exam.   Ask your caregiver about changing or stopping your regular medications.   Liquid injections (enemas) or laxatives may be required.   A large amount of electrolyte solution may be given to you to drink over a short period of time. This solution is used to clean out your colon.   You should be present 60 minutes prior to your procedure or as directed by your caregiver.  AFTER THE PROCEDURE   If you received a sedative or pain relieving medication, you will need to arrange for someone to drive you home.   Occasionally, there is a little blood passed with the first bowel movement. Do not be concerned.  FINDING OUT THE RESULTS OF YOUR TEST Not all test results are available during your visit. If your test results are not back during the visit, make an appointment with your caregiver to find out the results. Do not assume everything is normal if you have not heard from your caregiver or the medical facility. It is important for you to follow up on all of your test results. HOME CARE INSTRUCTIONS   It is not unusual to pass moderate amounts of gas and experience mild abdominal cramping following the procedure. This is due to air being used to inflate your colon during the exam. Walking or a warm pack on your belly (abdomen) may help.   You may resume all normal meals and activities after sedatives and medicines have worn off.   Only take over-the-counter or prescription medicines for pain, discomfort, or fever as directed by your caregiver. Do not use aspirin or blood thinners if a biopsy was taken. Consult your caregiver for medicine usage if biopsies were taken.  SEEK IMMEDIATE MEDICAL CARE IF:   You have a fever.   You pass large blood clots or fill a toilet with blood following the procedure. This may also occur 10 to 14 days following the procedure. This is more likely if a biopsy was taken.   You develop abdominal pain that keeps getting worse and cannot be relieved with medicine.   Document Released: 03/31/2000 Document Revised: 12/14/2010 Document Reviewed: 11/14/2007 [facility] Patient Information 2012 [place].PATIENT INSTRUCTIONS POST-ANESTHESIA  IMMEDIATELY FOLLOWING SURGERY:  Do not drive or operate machinery for the first twenty four hours after surgery.  Do not make any important decisions for twenty four hours after surgery or while taking narcotic pain medications or sedatives.  If you develop intractable nausea and vomiting or a severe headache please notify your doctor immediately.  FOLLOW-UP:  Please make an appointment with your surgeon as instructed. You do not need to follow up with anesthesia unless specifically instructed to do so.  WOUND CARE INSTRUCTIONS (if applicable):  Keep a dry clean dressing on the anesthesia/puncture wound site if there is drainage.  Once the wound has quit draining you may leave it open to air.  Generally you should leave the bandage intact for twenty four hours unless there is drainage.  If the epidural site drains for more than 36-48 hours please call the anesthesia department.  QUESTIONS?:  Please feel free to call your physician or the hospital operator if you have any questions, and they will be  happy to assist you.     [place] [phone]

## 2011-03-14 ENCOUNTER — Encounter (HOSPITAL_COMMUNITY): Payer: Self-pay | Admitting: Pharmacy Technician

## 2011-03-15 ENCOUNTER — Telehealth: Payer: Self-pay | Admitting: Gastroenterology

## 2011-03-15 NOTE — Telephone Encounter (Signed)
pts procedure time moved up due to cancellation in schedule- she is aware of new arrival time of 7am

## 2011-03-16 ENCOUNTER — Ambulatory Visit (HOSPITAL_COMMUNITY): Payer: Medicare Other | Admitting: Anesthesiology

## 2011-03-16 ENCOUNTER — Encounter (HOSPITAL_COMMUNITY): Payer: Self-pay | Admitting: Anesthesiology

## 2011-03-16 ENCOUNTER — Encounter (HOSPITAL_COMMUNITY)
Admission: RE | Disposition: A | Discharge status: Home / Self Care | Payer: Self-pay | Source: Ambulatory Visit | Attending: Internal Medicine

## 2011-03-16 ENCOUNTER — Other Ambulatory Visit: Payer: Self-pay | Admitting: Internal Medicine

## 2011-03-16 ENCOUNTER — Encounter (HOSPITAL_COMMUNITY): Payer: Self-pay | Admitting: *Deleted

## 2011-03-16 ENCOUNTER — Ambulatory Visit (HOSPITAL_COMMUNITY)
Admission: RE | Admit: 2011-03-16 | Discharge: 2011-03-16 | Disposition: A | Discharge status: Home / Self Care | Payer: Medicare Other | Source: Ambulatory Visit | Attending: Internal Medicine | Admitting: Internal Medicine

## 2011-03-16 ENCOUNTER — Telehealth: Payer: Self-pay | Admitting: Internal Medicine

## 2011-03-16 DIAGNOSIS — K625 Hemorrhage of anus and rectum: Secondary | ICD-10-CM

## 2011-03-16 DIAGNOSIS — R109 Unspecified abdominal pain: Secondary | ICD-10-CM

## 2011-03-16 DIAGNOSIS — Z7982 Long term (current) use of aspirin: Secondary | ICD-10-CM | POA: Insufficient documentation

## 2011-03-16 DIAGNOSIS — R1310 Dysphagia, unspecified: Secondary | ICD-10-CM

## 2011-03-16 DIAGNOSIS — K2960 Other gastritis without bleeding: Secondary | ICD-10-CM

## 2011-03-16 DIAGNOSIS — D126 Benign neoplasm of colon, unspecified: Secondary | ICD-10-CM

## 2011-03-16 DIAGNOSIS — K449 Diaphragmatic hernia without obstruction or gangrene: Secondary | ICD-10-CM | POA: Insufficient documentation

## 2011-03-16 DIAGNOSIS — I10 Essential (primary) hypertension: Secondary | ICD-10-CM | POA: Insufficient documentation

## 2011-03-16 DIAGNOSIS — K2940 Chronic atrophic gastritis without bleeding: Secondary | ICD-10-CM | POA: Insufficient documentation

## 2011-03-16 DIAGNOSIS — K921 Melena: Secondary | ICD-10-CM | POA: Insufficient documentation

## 2011-03-16 DIAGNOSIS — K5730 Diverticulosis of large intestine without perforation or abscess without bleeding: Secondary | ICD-10-CM

## 2011-03-16 DIAGNOSIS — Z79899 Other long term (current) drug therapy: Secondary | ICD-10-CM | POA: Insufficient documentation

## 2011-03-16 DIAGNOSIS — Q391 Atresia of esophagus with tracheo-esophageal fistula: Secondary | ICD-10-CM | POA: Insufficient documentation

## 2011-03-16 DIAGNOSIS — Q393 Congenital stenosis and stricture of esophagus: Secondary | ICD-10-CM | POA: Insufficient documentation

## 2011-03-16 HISTORY — PX: MALONEY DILATION: SHX5535

## 2011-03-16 HISTORY — PX: COLONOSCOPY: SHX174

## 2011-03-16 SURGERY — COLONOSCOPY WITH PROPOFOL
Anesthesia: Monitor Anesthesia Care

## 2011-03-16 MED ORDER — MIDAZOLAM HCL 2 MG/2ML IJ SOLN
INTRAMUSCULAR | Status: AC
  Filled 2011-03-16: qty 2

## 2011-03-16 MED ORDER — BUTAMBEN-TETRACAINE-BENZOCAINE 2-2-14 % EX AERO
1.0000 | INHALATION_SPRAY | Freq: Once | CUTANEOUS | Status: AC
  Administered 2011-03-16: 1 via TOPICAL
  Filled 2011-03-16: qty 56

## 2011-03-16 MED ORDER — MIDAZOLAM HCL 2 MG/2ML IJ SOLN
1.0000 mg | INTRAMUSCULAR | Status: DC | PRN
  Administered 2011-03-16: 2 mg via INTRAVENOUS

## 2011-03-16 MED ORDER — ONDANSETRON HCL 4 MG/2ML IJ SOLN: 4.0000 mg | Freq: Once | INTRAMUSCULAR | Status: DC | PRN

## 2011-03-16 MED ORDER — FENTANYL CITRATE 0.05 MG/ML IJ SOLN: 25.0000 ug | INTRAMUSCULAR | Status: DC | PRN

## 2011-03-16 MED ORDER — PROPOFOL 10 MG/ML IV EMUL
INTRAVENOUS | Status: AC
  Filled 2011-03-16: qty 20

## 2011-03-16 MED ORDER — PROPOFOL 10 MG/ML IV EMUL
INTRAVENOUS | Status: DC | PRN
  Administered 2011-03-16: 75 ug/kg/min via INTRAVENOUS

## 2011-03-16 MED ORDER — LACTATED RINGERS IV SOLN
INTRAVENOUS | Status: DC
  Administered 2011-03-16: 08:00:00 via INTRAVENOUS

## 2011-03-16 MED ORDER — LIDOCAINE HCL (CARDIAC) 20 MG/ML IV SOLN
INTRAVENOUS | Status: DC | PRN
  Administered 2011-03-16: 25 mg via INTRAVENOUS

## 2011-03-16 MED ORDER — GLYCOPYRROLATE 0.2 MG/ML IJ SOLN
INTRAMUSCULAR | Status: AC
  Filled 2011-03-16: qty 1

## 2011-03-16 MED ORDER — MIDAZOLAM HCL 5 MG/5ML IJ SOLN
INTRAMUSCULAR | Status: DC | PRN
  Administered 2011-03-16: 2 mg via INTRAVENOUS

## 2011-03-16 MED ORDER — STERILE WATER FOR IRRIGATION IR SOLN
Status: DC | PRN
  Administered 2011-03-16: 08:00:00

## 2011-03-16 MED ORDER — GLYCOPYRROLATE 0.2 MG/ML IJ SOLN
0.2000 mg | Freq: Once | INTRAMUSCULAR | Status: AC | PRN
  Administered 2011-03-16: 0.2 mg via INTRAVENOUS

## 2011-03-16 SURGICAL SUPPLY — 26 items
BLOCK BITE 60FR ADLT L/F BLUE (MISCELLANEOUS) ×2 IMPLANT
DEVICE CLIP HEMOSTAT 235CM (CLIP) IMPLANT
ELECT REM PT RETURN 9FT ADLT (ELECTROSURGICAL) ×2
ELECTRODE REM PT RTRN 9FT ADLT (ELECTROSURGICAL) IMPLANT
FCP BXJMBJMB 240X2.8X (CUTTING FORCEPS)
FLOOR PAD 36X40 (MISCELLANEOUS) ×2
FORCEP RJ3 GP 1.8X160 W-NEEDLE (CUTTING FORCEPS) ×1 IMPLANT
FORCEPS BIOP RAD 4 LRG CAP 4 (CUTTING FORCEPS) ×2 IMPLANT
FORCEPS BIOP RJ4 240 W/NDL (CUTTING FORCEPS)
FORCEPS BXJMBJMB 240X2.8X (CUTTING FORCEPS) IMPLANT
INJECTOR/SNARE I SNARE (MISCELLANEOUS) IMPLANT
LUBRICANT JELLY 4.5OZ STERILE (MISCELLANEOUS) IMPLANT
MANIFOLD NEPTUNE II (INSTRUMENTS) IMPLANT
MANIFOLD NEPTUNE WASTE (CANNULA) ×2 IMPLANT
NDL SCLEROTHERAPY 25GX240 (NEEDLE) ×1 IMPLANT
NEEDLE SCLEROTHERAPY 25GX240 (NEEDLE) IMPLANT
PAD FLOOR 36X40 (MISCELLANEOUS) ×1 IMPLANT
PROBE APC STR FIRE (PROBE) ×1 IMPLANT
PROBE INJECTION GOLD (MISCELLANEOUS)
PROBE INJECTION GOLD 7FR (MISCELLANEOUS) ×1 IMPLANT
SNARE ROTATE MED OVAL 20MM (MISCELLANEOUS) ×2 IMPLANT
SYR 50ML LL SCALE MARK (SYRINGE) IMPLANT
TRAP SPECIMEN MUCOUS 40CC (MISCELLANEOUS) ×1 IMPLANT
TUBING ENDO SMARTCAP PENTAX (MISCELLANEOUS) ×2 IMPLANT
TUBING IRRIGATION ENDOGATOR (MISCELLANEOUS) ×2 IMPLANT
WATER STERILE IRR 1000ML POUR (IV SOLUTION) ×1 IMPLANT

## 2011-03-16 NOTE — Transfer of Care (Signed)
Immediate Anesthesia Transfer of Care Note  Patient: LEXIANA BI  Procedure(s) Performed:  COLONOSCOPY WITH PROPOFOL - PROCEDURE BEGAN [id number]. iN CECUM AT [phone].  TOTAL WITHDRAWAL TIME--14 MINUTES--COLONOSCOPY WITH BIOPSIES; ESOPHAGOGASTRODUODENOSCOPY (EGD) WITH PROPOFOL - ESOPHAGOGASTRODUODENOSCOPY WITH GASTRIC BIOPSY; PROCEDURE COMPLETED AT [id number].; [place] WITH 16 MALONEY  Patient Location: PACU  Anesthesia Type: MAC  Level of Consciousness: sedated and patient cooperative  Airway & Oxygen Therapy: Patient Spontanous Breathing  Post-op Assessment: Report given to PACU RN, Post -op Vital signs reviewed and stable and Patient moving all extremities  Post vital signs: Reviewed and stable  Complications: No apparent anesthesia complications

## 2011-03-16 NOTE — Anesthesia Postprocedure Evaluation (Signed)
  Anesthesia Post-op Note  Patient: [name]  Procedure(s) Performed:  COLONOSCOPY WITH PROPOFOL - PROCEDURE BEGAN [id number]. iN CECUM AT [phone].  TOTAL WITHDRAWAL TIME--14 MINUTES--COLONOSCOPY WITH BIOPSIES; ESOPHAGOGASTRODUODENOSCOPY (EGD) WITH PROPOFOL - ESOPHAGOGASTRODUODENOSCOPY WITH GASTRIC BIOPSY; PROCEDURE COMPLETED AT [id number].; [place] WITH 86 MALONEY  Patient Location: PACU  Anesthesia Type: MAC  Level of Consciousness: awake, alert , oriented and patient cooperative  Airway and Oxygen Therapy: Patient Spontanous Breathing  Post-op Pain: none  Post-op Assessment: Post-op Vital signs reviewed, Patient's Cardiovascular Status Stable, Respiratory Function Stable, Patent Airway and No signs of Nausea or vomiting  Post-op Vital Signs: Reviewed and stable  Complications: No apparent anesthesia complications

## 2011-03-16 NOTE — H&P (Signed)
  I have seen & examined the patient prior to the procedure(s) today and reviewed the history and physical/consultation.  There have been no changes.  After consideration of the risks, benefits, alternatives and imponderables, the patient has consented to the procedure(s).

## 2011-03-16 NOTE — Telephone Encounter (Signed)
Work note at [employer].

## 2011-03-16 NOTE — Anesthesia Preprocedure Evaluation (Signed)
Anesthesia Evaluation  Patient identified by MRN, date of birth, ID band Patient awake    Reviewed: Allergy & Precautions, H&P , NPO status , Patient's Chart, lab work & pertinent test results  History of Anesthesia Complications Negative for: history of anesthetic complications  Airway Mallampati: II      Dental  (+) Edentulous Upper and Edentulous Lower   Pulmonary shortness of breath and with exertion, former smoker clear to auscultation        Cardiovascular hypertension, + angina with exertion + CAD and + Past MI Regular Normal    Neuro/Psych PSYCHIATRIC DISORDERS Anxiety Depression    GI/Hepatic GERD-  Medicated,  Endo/Other    Renal/GU      Musculoskeletal   Abdominal   Peds  Hematology   Anesthesia Other Findings   Reproductive/Obstetrics                           Anesthesia Physical Anesthesia Plan  ASA: III  Anesthesia Plan: MAC   Post-op Pain Management:    Induction: Intravenous  Airway Management Planned: Simple Face Mask  Additional Equipment:   Intra-op Plan:   Post-operative Plan:   Informed Consent: I have reviewed the patients History and Physical, chart, labs and discussed the procedure including the risks, benefits and alternatives for the proposed anesthesia with the patient or authorized representative who has indicated his/her understanding and acceptance.     Plan Discussed with:   Anesthesia Plan Comments:         Anesthesia Quick Evaluation

## 2011-03-16 NOTE — Telephone Encounter (Signed)
Pt's daughter called. Her mother [name]) had TCS done this morning and was asking for a work note. She will be coming by Friday morning to pick it up.

## 2011-03-21 ENCOUNTER — Encounter (HOSPITAL_COMMUNITY): Payer: Self-pay | Admitting: Internal Medicine

## 2011-04-19 DIAGNOSIS — N950 Postmenopausal bleeding: Secondary | ICD-10-CM | POA: Diagnosis not present

## 2011-04-20 ENCOUNTER — Encounter: Payer: Self-pay | Admitting: Gastroenterology

## 2011-04-20 ENCOUNTER — Ambulatory Visit (INDEPENDENT_AMBULATORY_CARE_PROVIDER_SITE_OTHER): Payer: Medicare Other | Admitting: Gastroenterology

## 2011-04-20 DIAGNOSIS — K219 Gastro-esophageal reflux disease without esophagitis: Secondary | ICD-10-CM

## 2011-04-20 DIAGNOSIS — R109 Unspecified abdominal pain: Secondary | ICD-10-CM | POA: Diagnosis not present

## 2011-04-20 NOTE — Progress Notes (Signed)
Primary Care Physician: Tawni Carnes, [place], [place]  Primary Gastroenterologist:  Garfield Cornea, MD   Chief Complaint  Patient presents with  . Follow-up    HPI: [name] is a 78 y.o. female here for followup for recent procedures. She has a history of abdominal pain, dysphagia, GERD, rectal bleeding. She complains of bilateral lower quadrant/flank pain ever since her surgery earlier in 2012.  Abdomen hurts in both flanks and into lower abdomen. No dysuria. No pain with meals or BMs. Wonders if right lower quadrant pain due to hip/leg since she is not wearing built up shoe like she is supposed to. Right leg nearly one inch shorter than the left leg. BM every other day. Stools are runny. No rectal bleeding, melena.  Vaginal bleeding from "spot that didn't heal after bladder tack." Dr. Barrie Dunker did her surgery but she recently saw Dr. Elonda Husky. She says that she needs to see surgeon about hernias.  Dysphagia is better. Get strangled occasionally. C/O left facial/gland issues, difficulty turning neck. Started Thanksgiving. Has not seen PCP. Ears feel full.   Occasional heartburn. Some chest pain with walking associated with diaphoresis. Hurts in mid chest. With bronchitis, DOE. Getting worse lately. Has used nitroglycerin twice in the last two months. Has appointment with Dr. Dannielle Burn this month. Last cardiac cath in 2006.    Current Outpatient Prescriptions  Medication Sig Dispense Refill  . ALPRAZolam (XANAX) 0.25 MG tablet Take 0.25 mg by mouth at bedtime as needed. May take BID or TID prn for anxiety       . aspirin 81 MG EC tablet Take 81 mg by mouth daily.        . Cranberry-Vitamin C-Vitamin E (CRANBERRY PLUS VITAMIN C) 140-100-3 MG-MG-UNIT CAPS Take 2 capsules by mouth daily.        . DULoxetine (CYMBALTA) 60 MG capsule Take 60 mg by mouth daily.        [name] Kitchen guar gum packet Take by mouth daily.        . isosorbide mononitrate (IMDUR) 120 MG 24 hr tablet Take 120 mg by mouth daily.        .  metoprolol (LOPRESSOR) 50 MG tablet Take 12.5 mg by mouth 2 (two) times daily.       . nitroGLYCERIN (NITROSTAT) 0.4 MG SL tablet Place 0.4 mg under the tongue every 5 (five) minutes as needed.        . Omega-3 Fatty Acids (FISH OIL) 1000 MG CAPS Take 1 capsule by mouth daily.        . pantoprazole (PROTONIX) 40 MG tablet Take 40 mg by mouth daily.        . polyethylene glycol (MIRALAX / GLYCOLAX) packet Take 17 g by mouth daily as needed.          Allergies as of 04/20/2011 - Review Complete 04/20/2011  Allergen Reaction Noted  . Prednisone Shortness Of Breath   . Codeine Itching   . Ofloxacin Rash     ROS:  General: Negative for anorexia, weight loss, fever, chills, fatigue, weakness. ENT: Negative for hoarseness, difficulty swallowing , nasal congestion. CV: see history of present illness.  Respiratory: see history of present illness. GI: See history of present illness. GU:  Negative for dysuria, hematuria, urinary incontinence, urinary frequency, nocturnal urination.  Endo: Negative for unusual weight change.    Physical Examination:   BP 150/71  Pulse 69  Temp(Src) 97.1 F (36.2 C) (Temporal)  Ht 5\' 1"  (1.549 m)  Wt 185 lb 6.4  oz (84.097 kg)  BMI 35.03 kg/m2  General: Well-nourished, well-developed in no acute distress.  Eyes: No icterus. Mouth: Oropharyngeal mucosa moist and pink , no lesions erythema or exudate. Lungs: Clear to auscultation bilaterally.  Heart: Regular rate and rhythm, no murmurs rubs or gallops.  Abdomen: Bowel sounds are normal, Mild lower abdominal tenderness. Nondistended, no hepatosplenomegaly or masses, no abdominal bruits or hernia , no rebound or guarding.   Extremities: No lower extremity edema. No clubbing or deformities. Neuro: Alert and oriented x 4   Skin: Warm and dry, no jaundice.   Psych: Alert and cooperative, normal mood and affect.

## 2011-04-20 NOTE — Assessment & Plan Note (Addendum)
C/O ongoing lower abdominal pain in both quadrants and in flanks. CT unrevealing. Labs were good. No notable hernias on exam. ?pain related to bladder tack vs referred from back or hip.  Patient denies constipation, doubt cause of pain.   Please note patient had multiple positive review of symptoms, mostly non-GI.   I will obtain further information from Dr. Brynda Greathouse office. Discuss with Dr. Gala Romney. Further recommendations to follow.

## 2011-04-20 NOTE — Patient Instructions (Signed)
We will contact you after I have obtained further information from Dr. Elonda Husky and have discussed your case with Dr. Gala Romney.  Please continue MiraLax as needed for constipation. Please continue Protonix.  I recommend you call your cardiologist about your intermittent chest pain. Go to the nearest ER if you have recurrent chest pain.

## 2011-04-20 NOTE — Assessment & Plan Note (Signed)
Well controlled 

## 2011-04-27 ENCOUNTER — Telehealth: Payer: Self-pay | Admitting: *Deleted

## 2011-04-27 NOTE — Telephone Encounter (Signed)
Pt states she is having chest pain a lot right in her chest and down her (L) arm. She states it just comes on her at least 4-5 times per day. She states she didn't go to the hospital b/c she was having so much problem with bladder tact and bleeding a lot. She saw Gyn and he fixed it so it quit bleeding. She can't sleep. She is also having severe headaches. Pt notified to go to ER immediately for evaluation. Pt verbalized understanding.

## 2011-05-15 NOTE — Progress Notes (Signed)
Received records from Dr. Elonda Husky: he treated her for granulation tissue at vaginal cuff. No further f/u planned with Dr. Elonda Husky per his notes. No mention by him of her having hernias. CT unremarkable.   I recommend OV with Dr. Gala Romney as he has not seen patient in the office. Her pain may be unrelated to GI track but would like for him to evaluate her.

## 2011-05-22 DIAGNOSIS — E039 Hypothyroidism, unspecified: Secondary | ICD-10-CM | POA: Diagnosis not present

## 2011-05-23 NOTE — Progress Notes (Signed)
Pt is aware of OV on 2/12 at 3pm with RMR

## 2011-05-30 ENCOUNTER — Ambulatory Visit (INDEPENDENT_AMBULATORY_CARE_PROVIDER_SITE_OTHER): Payer: Medicare Other | Admitting: Internal Medicine

## 2011-05-30 ENCOUNTER — Encounter: Payer: Self-pay | Admitting: Internal Medicine

## 2011-05-30 DIAGNOSIS — R109 Unspecified abdominal pain: Secondary | ICD-10-CM

## 2011-05-30 NOTE — Assessment & Plan Note (Signed)
One-year history of right lower quadrant abdominal pain. Pain unrelenting and insidiously worsening. It has a positional component. I doubt this is radicular pain. Likewise, I doubt this is musculoskeletal pain. It sounds visceral to me. Wonder about adhesions or an occult process i.e. spigelian hernia. She's had a thorough workup to date.  Recommendations: Proceed with a surgery consultation for consideration of diagnostic laparoscopy with for lyses of adhesions, etc. I've taken the liberty to get her scheduled see Dr. Aviva Signs as her former general surgeon, Dr. Lindalou Hose, is no longer in the area.

## 2011-05-30 NOTE — Patient Instructions (Addendum)
Consult wit Dr. Arnoldo Morale :re: diagnostic laparoscopy / RLQ abdominal pain  Telephone follow-up

## 2011-05-30 NOTE — Progress Notes (Signed)
Primary Care Physician:  Tawni Carnes, PA, PA Primary Gastroenterologist:  Dr. Gala Romney  Pre-Procedure History & Physical: HPI:  [name] is a 78 y.o. female here for further evaluation of right lower quadrant abdominal pain. Has had pain for a year now. Unrelenting and getting worse. Wakes up with it goes to bed with it worse when she's walking and lying on her right side when she goes from this sitting to standing.  No problems with bowels. Pain not associated with bowel function. Not associated with meals. There is no radicular component. She's never developed a rash in the area affected. Dr. Elonda Husky feels it her vaginal infection is well controlled now. Recent EGD colonoscopy and CT scan all unrevealing as the cause of her pain. She's had multiple surgeries. Her appendix is out. No urinary tract symptoms.  Past Medical History  Diagnosis Date  . PVD (peripheral vascular disease)   . Other and unspecified hyperlipidemia   . Coronary atherosclerosis of native coronary artery     nonobstructive CAD by multiple catheterizations  . Panic disorder without agoraphobia   . HTN (hypertension)     Hx of it  . MI (myocardial infarction) 2006  . Internal hemorrhoids without mention of complication   . Dysphagia, unspecified   . Microscopic colitis 2003  . GERD (gastroesophageal reflux disease)     Hx Schatzki's ring, multiple EGD/ED last 01/06/2004  . Hyperlipemia   . Thyroid disease     recent abnl TSH per pt  . S/P colonoscopy 09/27/2001    internal hemorrhoids, desc colon inflam polyp, SB BX-chronic duodenitis, colitis    Past Surgical History  Procedure Date  . Tonsillectomy   . Unspecified area, hysterectomy 1972    partial  . Cholecystectomy 1998  . Right knee replacement 2007  . Right leg benign tumor   . Breast lumpectomy 1998    left, benign  . Left hand surgery   . Left rotator cuff surgery   . Nasal sinus surgery   . Bladder tack 06/2010  . Cardiac catheterization   .  Maloney dilation 03/16/2011    Gastritis. No H.pylori on bx. 78F maloney dilation with disruption of  occult cevical esophageal web  . Colonoscopy 03/16/2011    multiple hyperplastic colon polyps, sigmoid diverticulosis, melanosis coli    Prior to Admission medications   Medication Sig Start Date End Date Taking? Authorizing Provider  ALPRAZolam (XANAX) 0.25 MG tablet Take 0.25 mg by mouth at bedtime as needed. May take BID or TID prn for anxiety    Yes Historical Provider, MD  aspirin 81 MG EC tablet Take 81 mg by mouth daily.     Yes Historical Provider, MD  Cranberry-Vitamin C-Vitamin E (CRANBERRY PLUS VITAMIN C) 140-100-3 MG-MG-UNIT CAPS Take 2 capsules by mouth daily.     Yes Historical Provider, MD  DULoxetine (CYMBALTA) 60 MG capsule Take 60 mg by mouth daily.     Yes Historical Provider, MD  guar gum packet Take by mouth daily.     Yes Historical Provider, MD  isosorbide mononitrate (IMDUR) 120 MG 24 hr tablet Take 120 mg by mouth daily.     Yes Historical Provider, MD  metoprolol (LOPRESSOR) 50 MG tablet Take 12.5 mg by mouth 2 (two) times daily.    Yes Historical Provider, MD  nitroGLYCERIN (NITROSTAT) 0.4 MG SL tablet Place 0.4 mg under the tongue every 5 (five) minutes as needed.     Yes Historical Provider, MD  Omega-3 Fatty Acids (FISH OIL)  1000 MG CAPS Take 1 capsule by mouth daily.     Yes Historical Provider, MD  pantoprazole (PROTONIX) 40 MG tablet Take 40 mg by mouth daily.     Yes Historical Provider, MD  polyethylene glycol (MIRALAX / GLYCOLAX) packet Take 17 g by mouth daily as needed.     Yes Historical Provider, MD    Allergies as of 05/30/2011 - Review Complete 05/30/2011  Allergen Reaction Noted  . Prednisone Shortness Of Breath   . Codeine Itching   . Ofloxacin Rash     Family History  Problem Relation Age of Onset  . Coronary artery disease Other     family Hx-sons  . Stroke Other     family Hx  . Hypertension Other     family Hx  . Diabetes Brother      History   Social History  . Marital Status: Divorced    Spouse Name: N/A    Number of Children: 36  . Years of Education: N/A   Occupational History  . retired    Social History Main Topics  . Smoking status: Former Smoker -- 1.0 packs/day for 64 years    Types: Cigarettes    Quit date: 11/17/2001  . Smokeless tobacco: Never Used   Comment: no tobacco  . Alcohol Use: No  . Drug Use: No  . Sexually Active: Not on file   Other Topics Concern  . Not on file   Social History Narrative   Divorced.Sister had colon perforation & died from complications in [place], [place]    Review of Systems: See HPI, otherwise negative ROS  Physical Exam: BP 132/65  Pulse 71  Temp(Src) 98 F (36.7 C) (Temporal)  Ht 5\' 1"  (1.549 m)  Wt 183 lb 12.8 oz (83.371 kg)  BMI 34.73 kg/m2 General:   Alert,  Well-developed, well-nourished, pleasant and cooperative in NAD Skin:  Intact without significant lesions or rashes. Eyes:  Sclera clear, no icterus.   Conjunctiva pink. Ears:  Normal auditory acuity. Nose:  No deformity, discharge,  or lesions. Mouth:  No deformity or lesions. Neck:  Supple; no masses or thyromegaly. No significant cervical adenopathy. Lungs:  Clear throughout to auscultation.   No wheezes, crackles, or rhonchi. No acute distress. Heart:  Regular rate and rhythm; no murmurs, clicks, rubs,  or gallops. Abdomen: Non-distended, normal bowel sounds.  Soft , localized tender tenderness right lower quadrant. No mass no rebound tenderness  Pulses:  Normal pulses noted. Extremities:  Without clubbing or edema.  Impression/Plan:

## 2011-05-31 ENCOUNTER — Telehealth: Payer: Self-pay | Admitting: Gastroenterology

## 2011-05-31 NOTE — Telephone Encounter (Signed)
Pt is scheduled to see Dr Geroge Baseman 02/19 @ 10:30- She is aware of this appt

## 2011-05-31 NOTE — Telephone Encounter (Signed)
Referral faxed to Dr Arnoldo Morale

## 2011-06-06 ENCOUNTER — Other Ambulatory Visit (HOSPITAL_COMMUNITY): Payer: Self-pay | Admitting: General Surgery

## 2011-06-06 DIAGNOSIS — R1031 Right lower quadrant pain: Secondary | ICD-10-CM | POA: Diagnosis not present

## 2011-06-07 ENCOUNTER — Ambulatory Visit (HOSPITAL_COMMUNITY)
Admission: RE | Admit: 2011-06-07 | Discharge: 2011-06-07 | Disposition: A | Discharge status: Home / Self Care | Payer: Medicare Other | Source: Ambulatory Visit | Attending: General Surgery | Admitting: General Surgery

## 2011-06-07 ENCOUNTER — Encounter (HOSPITAL_COMMUNITY): Payer: Self-pay

## 2011-06-07 DIAGNOSIS — I700 Atherosclerosis of aorta: Secondary | ICD-10-CM | POA: Diagnosis not present

## 2011-06-07 DIAGNOSIS — R1031 Right lower quadrant pain: Secondary | ICD-10-CM | POA: Insufficient documentation

## 2011-06-07 DIAGNOSIS — Z9089 Acquired absence of other organs: Secondary | ICD-10-CM | POA: Diagnosis not present

## 2011-06-08 DIAGNOSIS — H2510 Age-related nuclear cataract, unspecified eye: Secondary | ICD-10-CM | POA: Diagnosis not present

## 2011-06-13 DIAGNOSIS — R1031 Right lower quadrant pain: Secondary | ICD-10-CM | POA: Diagnosis not present

## 2011-07-25 DIAGNOSIS — R1031 Right lower quadrant pain: Secondary | ICD-10-CM | POA: Diagnosis not present

## 2011-07-31 DIAGNOSIS — E669 Obesity, unspecified: Secondary | ICD-10-CM | POA: Diagnosis not present

## 2011-07-31 DIAGNOSIS — K219 Gastro-esophageal reflux disease without esophagitis: Secondary | ICD-10-CM | POA: Diagnosis not present

## 2011-07-31 DIAGNOSIS — I739 Peripheral vascular disease, unspecified: Secondary | ICD-10-CM | POA: Diagnosis not present

## 2011-07-31 DIAGNOSIS — R0789 Other chest pain: Secondary | ICD-10-CM | POA: Diagnosis not present

## 2011-07-31 DIAGNOSIS — Z6833 Body mass index (BMI) 33.0-33.9, adult: Secondary | ICD-10-CM | POA: Diagnosis not present

## 2011-07-31 DIAGNOSIS — R079 Chest pain, unspecified: Secondary | ICD-10-CM | POA: Diagnosis not present

## 2011-07-31 DIAGNOSIS — F329 Major depressive disorder, single episode, unspecified: Secondary | ICD-10-CM | POA: Diagnosis not present

## 2011-07-31 DIAGNOSIS — Z87891 Personal history of nicotine dependence: Secondary | ICD-10-CM | POA: Diagnosis not present

## 2011-07-31 DIAGNOSIS — I10 Essential (primary) hypertension: Secondary | ICD-10-CM | POA: Diagnosis not present

## 2011-07-31 DIAGNOSIS — Z79899 Other long term (current) drug therapy: Secondary | ICD-10-CM | POA: Diagnosis not present

## 2011-07-31 DIAGNOSIS — R61 Generalized hyperhidrosis: Secondary | ICD-10-CM | POA: Diagnosis not present

## 2011-07-31 DIAGNOSIS — I2510 Atherosclerotic heart disease of native coronary artery without angina pectoris: Secondary | ICD-10-CM | POA: Diagnosis not present

## 2011-07-31 DIAGNOSIS — E7800 Pure hypercholesterolemia, unspecified: Secondary | ICD-10-CM | POA: Diagnosis not present

## 2011-07-31 DIAGNOSIS — R0602 Shortness of breath: Secondary | ICD-10-CM | POA: Diagnosis not present

## 2011-07-31 DIAGNOSIS — F411 Generalized anxiety disorder: Secondary | ICD-10-CM | POA: Diagnosis not present

## 2011-07-31 DIAGNOSIS — Z880 Allergy status to penicillin: Secondary | ICD-10-CM | POA: Diagnosis not present

## 2011-07-31 DIAGNOSIS — Z7982 Long term (current) use of aspirin: Secondary | ICD-10-CM | POA: Diagnosis not present

## 2011-08-01 DIAGNOSIS — R079 Chest pain, unspecified: Secondary | ICD-10-CM | POA: Diagnosis not present

## 2011-08-04 ENCOUNTER — Encounter: Payer: Self-pay | Admitting: Physician Assistant

## 2011-08-04 DIAGNOSIS — Z7982 Long term (current) use of aspirin: Secondary | ICD-10-CM | POA: Diagnosis not present

## 2011-08-04 DIAGNOSIS — R072 Precordial pain: Secondary | ICD-10-CM

## 2011-08-04 DIAGNOSIS — R079 Chest pain, unspecified: Secondary | ICD-10-CM | POA: Diagnosis not present

## 2011-08-08 ENCOUNTER — Encounter: Payer: Self-pay | Admitting: *Deleted

## 2011-08-15 DIAGNOSIS — K219 Gastro-esophageal reflux disease without esophagitis: Secondary | ICD-10-CM | POA: Diagnosis not present

## 2011-08-15 DIAGNOSIS — K589 Irritable bowel syndrome without diarrhea: Secondary | ICD-10-CM | POA: Diagnosis not present

## 2011-08-15 DIAGNOSIS — R5383 Other fatigue: Secondary | ICD-10-CM | POA: Diagnosis not present

## 2011-08-15 DIAGNOSIS — G4730 Sleep apnea, unspecified: Secondary | ICD-10-CM | POA: Diagnosis not present

## 2011-08-15 DIAGNOSIS — J449 Chronic obstructive pulmonary disease, unspecified: Secondary | ICD-10-CM | POA: Diagnosis not present

## 2011-08-15 DIAGNOSIS — Z79899 Other long term (current) drug therapy: Secondary | ICD-10-CM | POA: Diagnosis not present

## 2011-08-15 DIAGNOSIS — M545 Low back pain: Secondary | ICD-10-CM | POA: Diagnosis not present

## 2011-08-15 DIAGNOSIS — I10 Essential (primary) hypertension: Secondary | ICD-10-CM | POA: Diagnosis not present

## 2011-08-15 DIAGNOSIS — E782 Mixed hyperlipidemia: Secondary | ICD-10-CM | POA: Insufficient documentation

## 2011-08-15 DIAGNOSIS — K649 Unspecified hemorrhoids: Secondary | ICD-10-CM | POA: Diagnosis not present

## 2011-08-15 DIAGNOSIS — E7800 Pure hypercholesterolemia, unspecified: Secondary | ICD-10-CM | POA: Diagnosis not present

## 2011-08-16 ENCOUNTER — Encounter: Payer: Self-pay | Admitting: Physician Assistant

## 2011-08-16 ENCOUNTER — Ambulatory Visit (INDEPENDENT_AMBULATORY_CARE_PROVIDER_SITE_OTHER): Payer: Medicare Other | Admitting: Physician Assistant

## 2011-08-16 VITALS — BP 151/66 | HR 66 | Ht 61.0 in | Wt 182.5 lb

## 2011-08-16 DIAGNOSIS — I2510 Atherosclerotic heart disease of native coronary artery without angina pectoris: Secondary | ICD-10-CM | POA: Diagnosis not present

## 2011-08-16 DIAGNOSIS — Z8679 Personal history of other diseases of the circulatory system: Secondary | ICD-10-CM

## 2011-08-16 DIAGNOSIS — E785 Hyperlipidemia, unspecified: Secondary | ICD-10-CM | POA: Diagnosis not present

## 2011-08-16 DIAGNOSIS — I5032 Chronic diastolic (congestive) heart failure: Secondary | ICD-10-CM

## 2011-08-16 DIAGNOSIS — I509 Heart failure, unspecified: Secondary | ICD-10-CM

## 2011-08-16 NOTE — Assessment & Plan Note (Signed)
Will defer to Dr. Edythe Lynn office for ongoing monitoring/management.

## 2011-08-16 NOTE — Patient Instructions (Signed)
Continue all current medications. Your physician wants you to follow up in:  1 year.  You will receive a reminder letter in the mail one-two months in advance.  If you don't receive a letter, please call our office to schedule the follow up appointment   

## 2011-08-16 NOTE — Assessment & Plan Note (Signed)
Patient  has documented intolerance to statins, most recently Crestor. Followup labs drawn yesterday in Dr. Edythe Lynn office, to whom we will defer future monitoring/management. Aggressive  treatment recommended, however, with target LDL 70 or less, if feasible.

## 2011-08-16 NOTE — Assessment & Plan Note (Signed)
Euvolemic by history, current PE, and CXR during recent hospitalization.

## 2011-08-16 NOTE — Progress Notes (Signed)
HPI: Patient presents for post hospital followup, following recent brief admission here at [facility]. She was seen by myself and Dr. Domenic Polite in consultation for CP syndrome, and ruled out for MI with normal cardiac markers. We recommended further evaluation as an outpatient with a dobutamine stress echocardiogram, completed on April 19, and which was within normal limits.  Patient was seen in followup in Dr. Edythe Lynn office yesterday, and routine labs were drawn. She states her blood pressure was 171/68. She also complains of severe stomach pain, and is due to see Dr. Geroge Baseman in [place].  Regarding her CP, she reports no significant change from baseline, since her recent brief hospitalization. Once again, there is no strict correlation with exertion. She, herself, also cites "anxiety" as possible etiology. She also had previously stated that she has severe reflux disease.  Allergies  Allergen Reactions  . Prednisone Shortness Of Breath  . Codeine Itching  . Atorvastatin   . Phenothiazines   . Pimozide   . Polysorbate   . Prochlorperazine   . Statins   . Ofloxacin Rash    Current Outpatient Prescriptions  Medication Sig Dispense Refill  . ALPRAZolam (XANAX) 0.25 MG tablet Take 0.25 mg by mouth at bedtime as needed. May take BID or TID prn for anxiety       . aspirin 81 MG EC tablet Take 81 mg by mouth daily.        . Cranberry-Vitamin C-Vitamin E (CRANBERRY PLUS VITAMIN C) 140-100-3 MG-MG-UNIT CAPS Take 1 capsule by mouth daily.       . DULoxetine (CYMBALTA) 60 MG capsule Take 60 mg by mouth daily.        . isosorbide mononitrate (IMDUR) 120 MG 24 hr tablet Take 120 mg by mouth daily.        . metoprolol (LOPRESSOR) 50 MG tablet Take 25 mg by mouth 2 (two) times daily.       . Multiple Vitamin (MULTIVITAMIN) tablet Take 1 tablet by mouth daily.      . nitroGLYCERIN (NITROSTAT) 0.4 MG SL tablet Place 0.4 mg under the tongue every 5 (five) minutes as needed.        . Omega-3 Fatty Acids  (FISH OIL) 1000 MG CAPS Take 1 capsule by mouth daily.        . pantoprazole (PROTONIX) 40 MG tablet Take 40 mg by mouth daily.        . polyethylene glycol (MIRALAX / GLYCOLAX) packet Take 17 g by mouth daily as needed.          Past Medical History  Diagnosis Date  . PVD (peripheral vascular disease)   . Other and unspecified hyperlipidemia   . Coronary atherosclerosis of native coronary artery     nonobstructive CAD by multiple catheterizations  . Panic disorder without agoraphobia   . HTN (hypertension)     Hx of it  . MI (myocardial infarction) 2006  . Internal hemorrhoids without mention of complication   . Dysphagia, unspecified   . Microscopic colitis 2003  . GERD (gastroesophageal reflux disease)     Hx Schatzki's ring, multiple EGD/ED last 01/06/2004  . Hyperlipemia   . Thyroid disease     recent abnl TSH per pt  . S/P colonoscopy 09/27/2001    internal hemorrhoids, desc colon inflam polyp, SB BX-chronic duodenitis, colitis    Past Surgical History  Procedure Date  . Tonsillectomy   . Unspecified area, hysterectomy 1972    partial  . Cholecystectomy 1998  . Right knee  replacement 2007  . Right leg benign tumor   . Breast lumpectomy 1998    left, benign  . Left hand surgery   . Left rotator cuff surgery   . Nasal sinus surgery   . Bladder tack 06/2010  . Cardiac catheterization   . Maloney dilation 03/16/2011    Gastritis. No H.pylori on bx. 67F maloney dilation with disruption of  occult cevical esophageal web  . Colonoscopy 03/16/2011    multiple hyperplastic colon polyps, sigmoid diverticulosis, melanosis coli    History   Social History  . Marital Status: Divorced    Spouse Name: N/A    Number of Children: 23  . Years of Education: N/A   Occupational History  . retired    Social History Main Topics  . Smoking status: Former Smoker -- 1.0 packs/day for 64 years    Types: Cigarettes    Quit date: 11/17/2001  . Smokeless tobacco: Never Used    Comment: no tobacco  . Alcohol Use: No  . Drug Use: No  . Sexually Active: Not on file   Other Topics Concern  . Not on file   Social History Narrative   Divorced.Sister had colon perforation & died from complications in [place], [place]    Family History  Problem Relation Age of Onset  . Coronary artery disease Other     family Hx-sons  . Stroke Other     family Hx  . Hypertension Other     family Hx  . Diabetes Brother     ROS: no nausea, vomiting; no fever, chills; no melena, hematochezia; no claudication  PHYSICAL EXAM: BP 151/66  Pulse 66  Ht 5\' 1"  (1.549 m)  Wt 182 lb 8 oz (82.781 kg)  BMI 34.48 kg/m2 GENERAL: 78 year old female; NAD HEENT: NCAT, PERRLA, EOMI; sclera clear; no xanthelasma NECK: palpable bilateral carotid pulses, no bruits; no JVD; no TM LUNGS: CTA bilaterally CARDIAC: RRR (S1, S2); no significant murmurs; no rubs or gallops ABDOMEN: soft, non-tender; intact BS EXTREMETIES: intact distal pulses; no significant peripheral edema SKIN: warm/dry; no obvious rash/lesions MUSCULOSKELETAL: no joint deformity NEURO: no focal deficit; NL affect   EKG: reviewed and available in Electronic Records   ASSESSMENT & PLAN:

## 2011-08-16 NOTE — Assessment & Plan Note (Signed)
No further workup indicated. Patient recently hospitalized with atypical CP, with normal cardiac markers. We referred her for outpatient dobutamine stress echocardiogram, which was normal. Therefore, aggressive secondary prevention measures recommended, and we will have patient return to Dr. Dannielle Burn in one year.

## 2011-08-22 DIAGNOSIS — R1031 Right lower quadrant pain: Secondary | ICD-10-CM | POA: Diagnosis not present

## 2011-08-28 ENCOUNTER — Other Ambulatory Visit (HOSPITAL_COMMUNITY): Payer: Self-pay | Admitting: Urology

## 2011-08-28 DIAGNOSIS — R32 Unspecified urinary incontinence: Secondary | ICD-10-CM | POA: Diagnosis not present

## 2011-08-28 DIAGNOSIS — R350 Frequency of micturition: Secondary | ICD-10-CM | POA: Diagnosis not present

## 2011-08-28 DIAGNOSIS — N393 Stress incontinence (female) (male): Secondary | ICD-10-CM | POA: Diagnosis not present

## 2011-08-31 ENCOUNTER — Ambulatory Visit (HOSPITAL_COMMUNITY)
Admission: RE | Admit: 2011-08-31 | Discharge: 2011-08-31 | Disposition: A | Discharge status: Home / Self Care | Payer: Medicare Other | Source: Ambulatory Visit | Attending: Urology | Admitting: Urology

## 2011-08-31 DIAGNOSIS — R32 Unspecified urinary incontinence: Secondary | ICD-10-CM | POA: Diagnosis not present

## 2011-08-31 DIAGNOSIS — I708 Atherosclerosis of other arteries: Secondary | ICD-10-CM | POA: Diagnosis not present

## 2011-08-31 DIAGNOSIS — Z9089 Acquired absence of other organs: Secondary | ICD-10-CM | POA: Diagnosis not present

## 2011-08-31 DIAGNOSIS — Z90710 Acquired absence of both cervix and uterus: Secondary | ICD-10-CM | POA: Insufficient documentation

## 2011-08-31 DIAGNOSIS — K551 Chronic vascular disorders of intestine: Secondary | ICD-10-CM | POA: Insufficient documentation

## 2011-08-31 DIAGNOSIS — N3946 Mixed incontinence: Secondary | ICD-10-CM | POA: Diagnosis not present

## 2011-08-31 DIAGNOSIS — I700 Atherosclerosis of aorta: Secondary | ICD-10-CM | POA: Insufficient documentation

## 2011-08-31 MED ORDER — IOHEXOL 300 MG/ML  SOLN
125.0000 mL | Freq: Once | INTRAMUSCULAR | Status: AC | PRN
  Administered 2011-08-31: 125 mL via INTRAVENOUS

## 2011-10-09 DIAGNOSIS — Z1239 Encounter for other screening for malignant neoplasm of breast: Secondary | ICD-10-CM | POA: Diagnosis not present

## 2011-10-18 DIAGNOSIS — R300 Dysuria: Secondary | ICD-10-CM | POA: Diagnosis not present

## 2011-10-18 DIAGNOSIS — M47817 Spondylosis without myelopathy or radiculopathy, lumbosacral region: Secondary | ICD-10-CM | POA: Diagnosis not present

## 2011-10-26 DIAGNOSIS — N393 Stress incontinence (female) (male): Secondary | ICD-10-CM | POA: Diagnosis not present

## 2011-10-30 ENCOUNTER — Encounter (HOSPITAL_COMMUNITY): Payer: Self-pay | Admitting: Pharmacy Technician

## 2011-11-03 ENCOUNTER — Encounter (HOSPITAL_COMMUNITY): Payer: Self-pay

## 2011-11-03 ENCOUNTER — Encounter (HOSPITAL_COMMUNITY)
Admission: RE | Admit: 2011-11-03 | Discharge: 2011-11-03 | Disposition: A | Discharge status: Home / Self Care | Payer: Medicare Other | Source: Ambulatory Visit | Attending: Urology | Admitting: Urology

## 2011-11-03 DIAGNOSIS — Z79899 Other long term (current) drug therapy: Secondary | ICD-10-CM | POA: Diagnosis not present

## 2011-11-03 DIAGNOSIS — Z01812 Encounter for preprocedural laboratory examination: Secondary | ICD-10-CM | POA: Diagnosis not present

## 2011-11-03 DIAGNOSIS — N393 Stress incontinence (female) (male): Secondary | ICD-10-CM | POA: Diagnosis not present

## 2011-11-03 DIAGNOSIS — I10 Essential (primary) hypertension: Secondary | ICD-10-CM | POA: Diagnosis not present

## 2011-11-03 DIAGNOSIS — J449 Chronic obstructive pulmonary disease, unspecified: Secondary | ICD-10-CM | POA: Diagnosis not present

## 2011-11-03 HISTORY — DX: Unspecified osteoarthritis, unspecified site: M19.90

## 2011-11-03 LAB — HEMOGLOBIN AND HEMATOCRIT, BLOOD: HCT: 35.4 % — ABNORMAL LOW (ref 36.0–46.0)

## 2011-11-03 LAB — BASIC METABOLIC PANEL
BUN: 17 mg/dL (ref 6–23)
Chloride: 105 mEq/L (ref 96–112)
GFR calc non Af Amer: 55 mL/min — ABNORMAL LOW (ref >90)
Glucose, Bld: 94 mg/dL (ref 70–99)
Potassium: 4.2 mEq/L (ref 3.5–5.1)
Sodium: 140 mEq/L (ref 135–145)

## 2011-11-03 NOTE — Patient Instructions (Signed)
20 JAMMIE JESSON  11/03/2011   Your procedure is scheduled on:  Thursday, 11/09/11  Report to [name] at 0730 AM.  Call this number if you have problems the morning of surgery: [phone]   Remember:   Do not eat food:After Midnight.  May have clear liquids:until Midnight .  Clear liquids include soda, tea, black coffee, apple or grape juice, broth.  Take these medicines the morning of surgery with A SIP OF WATER: protonix, metoprolol, imdur, cymbalta and xanax if needed.   Do not wear jewelry, make-up or nail polish.  Do not wear lotions, powders, or perfumes. You may wear deodorant.  Do not shave 48 hours prior to surgery. Men may shave face and neck.  Do not bring valuables to the hospital.  Contacts, dentures or bridgework may not be worn into surgery.  Leave suitcase in the car. After surgery it may be brought to your room.  For patients admitted to the hospital, checkout time is 11:00 AM the day of discharge.   Patients discharged the day of surgery will not be allowed to drive home.  Name and phone number of your driver: family  Special Instructions: CHG Shower Use Special Wash: 1/2 bottle night before surgery and 1/2 bottle morning of surgery.   Please read over the following fact sheets that you were given: Pain Booklet, MRSA Information, Surgical Site Infection Prevention, Anesthesia Post-op Instructions and Care and Recovery After Surgery  Tension Free Vaginal Tape System Care After Please read the instructions below. Refer to these instructions for the next few weeks. These instructions provide you with general information on caring for yourself after your operation. Your caregiver may also give you specific instructions. While your treatment has been planned according to the most current medical practices available, unavoidable problems sometimes occur. Discomfort from the operation area is normal for a couple of weeks. If you have any questions or problems after discharge,  please call your caregiver. HOME CARE INSTRUCTIONS   Take your prescribed medications as directed by your caregiver.   You may take over-the-counter medication for minor pain with your caregiver's recommendation.   You may resume your usual diet.   Do not take aspirin. It can cause bleeding.   It is helpful to have a responsible person with you for a few days or a week after the surgery.   Shower or bath as directed.   Do not lift anything over 5 pounds.   Change your dressing as directed.   Do not drive until your caregiver says it is OK.   Do not have sexual intercourse until your caregiver says it is OK.   Do not use tampons.   You may take a laxative with your caregiver's recommendation.   You may take sitz baths 2 to 3 times a day with your caregiver's advice.   Make and keep your postoperative appointments.  SEEK MEDICAL CARE IF:   There is increasing pain in the wound area.   There is swelling or redness in the wound area.   You develop abnormal vaginal discharge.   You develop a rash.   You develop nausea, vomiting, constipation or diarrhea.   You think the stitches in the wound are breaking.   You lose urine when you cough.   You have problems with your medications.  SEEK IMMEDIATE MEDICAL CARE IF:   You develop a temperature of 102 F (38.9 C) or higher.   You have bleeding from the wound area above the  pubic bone or from the vagina.   You see pus coming from the incisions.   You cannot urinate.   You develop bloody or painful urination.   You pass out.   You develop leg or chest pain.   You develop abdominal pain.   You develop shortness of breath.  Document Released: 06/30/2008 Document Revised: 03/23/2011 Document Reviewed: 06/30/2008 [facility] Patient Information 2012 [place].Tension Free Vaginal Tape System (TVT) Tension free vaginal tape (TVT or TFT) procedure is a surgery procedure to correct involuntary leaking from the  bladder(urinary stress incontinence) in women. A 1/3-inch prolene mesh tape is used as a sling underneath the area where the bladder and tube that drains the bladder(urethra) connect. This restores the muscle and connective tissue (fascia) that have been stretched and damaged from having babies, or from chronic stress and straining. The operation takes less than an hour. It is an outpatient procedure (you go home the same day). It has an 80 to 90% success rate. LET YOUR CAREGIVER KNOW ABOUT:   If you have any allergies, especially to medicines.   All the medicines you are taking, including prescription drugs, over-the-counter drugs, herbal medicines, eye drops, and creams.   Previous problems with anesthesia or Novocaine.   History of blood clots or other bleeding problems.   Previous surgery, especially bladder or vaginal surgery.   Other health problems or medical diseases.  RISKS AND COMPLICATIONS   Bleeding.   Infection.   Injury to surrounding organs and blood vessels.   Problems with urination after the surgery.   Failure of the operation to work as expected.   Allergy to the mesh tape.   Problems or allergies to the anesthetic.  BEFORE THE PROCEDURE   Do not eat or drink anything for 12 hours before the surgery.   Do not take aspirin or blood thinners for one week before the surgery.   Let your caregiver know if you develop a cold or an infection before the operation.   If you are admitted on the day of surgery, arrive at the hospital an hour before the operation, to sign any necessary forms and to get prepared for the surgery.   There is a waiting area available for family and friends during the surgery.   Let your caregiver know if you think you may be pregnant.  PROCEDURE  An intravenous (IV) will be placed in your arm, and a medicine will be given to relax you, but not put you to sleep. You will be given a local anesthetic in the area above the pubic bone and  along the top of the vagina. Two very small incisions will be made just above the pubic bone. The lining (mucosa) at the top of the vagina will be opened and separated on each side of the urethra and bladder. A catheter is then placed in the bladder, for filling and drainage during the procedure. Then the prolene mesh tape is placed under the urethra, and the ends are passed up behind the pubic bone, just under the skin. After the mesh tape is properly placed, the catheter is removed. The patient is asked to cough, so that adjustments can be made to the tape, to make sure there is no leaking of urine before stitching (suturing) the mesh tape in place. The ends of the mesh tape are brought up, just under the skin, and sutured in place. The skin incisions are sutured closed. The vaginal lining is also stitched closed, and you are  taken to the recovery room. This operation should not be done if the woman is pregnant or plans to get pregnant, is on blood thinners (anticoagulant treatment), or has a urinary tract infection. AFTER THE PROCEDURE  You will go to a postoperative recovery area until your vital signs (blood pressure, pulse, breathing, and temperature) are stable. You may need to stay in the hospital a day or two, but most patients are sent home the day of the surgery. You may be sent home with pain pills or an antibiotic, if necessary. HOME CARE INSTRUCTIONS   Follow your caregiver's advice about diet, rest, driving, exercise, medicines, and follow-up appointments.   You may take over-the-counter pain medicine, with your caregiver's recommendation.   Do not take aspirin. It can cause bleeding.   Do not lift over 5 pounds, until your caregiver says it is ok.   Do not have sexual intercourse, until your caregiver says it is ok.   Do not use tampons.   You may take a laxative if needed, with your caregiver's recommendation.   You may take sitz baths 2 to 3 times a day with your caregiver's  advice.  SEEK MEDICAL CARE IF:   There is increasing pain in the wound area.   There is increased swelling and redness in the wound area.   You develop a rash.   You develop nausea, diarrhea, constipation or vomiting.   You think the stitches in the wound are breaking up.   You lose urine when you cough or sneeze.  SEEK IMMEDIATE MEDICAL CARE IF:   You have an oral temperature above 102 F (38.9 C), not controlled by medicine.   You begin to bleed from the wound area, above the pubic bone or vagina.   You see pus coming from the incisions.   You develop an abnormal vaginal discharge.   You cannot urinate.   You develop bloody or painful urination.   You pass out.   You develop chest or leg pain.   You develop abdominal pain.  Document Released: 04/25/2009 Document Revised: 03/23/2011 Document Reviewed: 04/25/2009 [facility] Patient Information 2012 [place].

## 2011-11-09 ENCOUNTER — Encounter (HOSPITAL_COMMUNITY)
Admission: RE | Disposition: A | Discharge status: Home / Self Care | Payer: Self-pay | Source: Ambulatory Visit | Attending: Urology

## 2011-11-09 ENCOUNTER — Ambulatory Visit (HOSPITAL_COMMUNITY): Payer: Medicare Other | Admitting: Anesthesiology

## 2011-11-09 ENCOUNTER — Encounter (HOSPITAL_COMMUNITY): Payer: Self-pay | Admitting: Anesthesiology

## 2011-11-09 ENCOUNTER — Encounter (HOSPITAL_COMMUNITY): Payer: Self-pay | Admitting: *Deleted

## 2011-11-09 ENCOUNTER — Ambulatory Visit (HOSPITAL_COMMUNITY)
Admission: RE | Admit: 2011-11-09 | Discharge: 2011-11-09 | Disposition: A | Discharge status: Home / Self Care | Payer: Medicare Other | Source: Ambulatory Visit | Attending: Urology | Admitting: Urology

## 2011-11-09 DIAGNOSIS — Z79899 Other long term (current) drug therapy: Secondary | ICD-10-CM | POA: Insufficient documentation

## 2011-11-09 DIAGNOSIS — J4489 Other specified chronic obstructive pulmonary disease: Secondary | ICD-10-CM | POA: Insufficient documentation

## 2011-11-09 DIAGNOSIS — N393 Stress incontinence (female) (male): Secondary | ICD-10-CM | POA: Insufficient documentation

## 2011-11-09 DIAGNOSIS — Z01812 Encounter for preprocedural laboratory examination: Secondary | ICD-10-CM | POA: Diagnosis not present

## 2011-11-09 DIAGNOSIS — I10 Essential (primary) hypertension: Secondary | ICD-10-CM | POA: Diagnosis not present

## 2011-11-09 DIAGNOSIS — J449 Chronic obstructive pulmonary disease, unspecified: Secondary | ICD-10-CM | POA: Insufficient documentation

## 2011-11-09 HISTORY — PX: BLADDER SUSPENSION: SHX72

## 2011-11-09 SURGERY — URETHROPEXY, USING TRANSVAGINAL TAPE
Anesthesia: Spinal | Site: Urethra | Wound class: Clean Contaminated

## 2011-11-09 MED ORDER — PHENYLEPHRINE HCL 10 MG/ML IJ SOLN
INTRAMUSCULAR | Status: AC
  Filled 2011-11-09: qty 1

## 2011-11-09 MED ORDER — STERILE WATER FOR IRRIGATION IR SOLN
Status: DC | PRN
  Administered 2011-11-09: 1000 mL

## 2011-11-09 MED ORDER — EPHEDRINE SULFATE 50 MG/ML IJ SOLN
INTRAMUSCULAR | Status: AC
  Filled 2011-11-09: qty 1

## 2011-11-09 MED ORDER — MIDAZOLAM HCL 2 MG/2ML IJ SOLN
INTRAMUSCULAR | Status: AC
  Filled 2011-11-09: qty 2

## 2011-11-09 MED ORDER — FENTANYL CITRATE 0.05 MG/ML IJ SOLN: 25.0000 ug | INTRAMUSCULAR | Status: DC | PRN

## 2011-11-09 MED ORDER — PHENYLEPHRINE HCL 10 MG/ML IJ SOLN
INTRAMUSCULAR | Status: DC | PRN
  Administered 2011-11-09 (×6): 100 ug via INTRAVENOUS

## 2011-11-09 MED ORDER — BUPIVACAINE IN DEXTROSE 0.75-8.25 % IT SOLN
INTRATHECAL | Status: AC
  Filled 2011-11-09: qty 2

## 2011-11-09 MED ORDER — DEXTROSE 5 % IV SOLN
INTRAVENOUS | Status: AC
  Filled 2011-11-09: qty 10

## 2011-11-09 MED ORDER — EPHEDRINE SULFATE 50 MG/ML IJ SOLN
INTRAMUSCULAR | Status: DC | PRN
  Administered 2011-11-09 (×2): 10 mg via INTRAVENOUS

## 2011-11-09 MED ORDER — PROPOFOL 10 MG/ML IV EMUL
INTRAVENOUS | Status: AC
  Filled 2011-11-09: qty 20

## 2011-11-09 MED ORDER — LACTATED RINGERS IV SOLN
INTRAVENOUS | Status: DC | PRN
  Administered 2011-11-09 (×2): via INTRAVENOUS

## 2011-11-09 MED ORDER — VANCOMYCIN HCL IN DEXTROSE 1-5 GM/200ML-% IV SOLN
INTRAVENOUS | Status: AC
  Filled 2011-11-09: qty 200

## 2011-11-09 MED ORDER — LIDOCAINE HCL (CARDIAC) 10 MG/ML IV SOLN
INTRAVENOUS | Status: DC | PRN
  Administered 2011-11-09: 50 mg via INTRAVENOUS

## 2011-11-09 MED ORDER — VANCOMYCIN HCL 1000 MG IV SOLR
1000.0000 mg | INTRAVENOUS | Status: DC | PRN
  Administered 2011-11-09: 1000 mg via INTRAVENOUS

## 2011-11-09 MED ORDER — PROPOFOL 10 MG/ML IV EMUL
INTRAVENOUS | Status: DC | PRN
  Administered 2011-11-09: 7.5 mg via INTRAVENOUS

## 2011-11-09 MED ORDER — PROPOFOL 10 MG/ML IV EMUL
INTRAVENOUS | Status: DC | PRN
  Administered 2011-11-09 (×2): 50 ug/kg/min via INTRAVENOUS

## 2011-11-09 MED ORDER — CIPROFLOXACIN IN D5W 200 MG/100ML IV SOLN
INTRAVENOUS | Status: AC
  Filled 2011-11-09: qty 100

## 2011-11-09 MED ORDER — SODIUM CHLORIDE 0.9 % IJ SOLN
INTRAMUSCULAR | Status: DC | PRN
  Administered 2011-11-09: 10 mL via INTRAVENOUS

## 2011-11-09 MED ORDER — ONDANSETRON HCL 4 MG/2ML IJ SOLN: 4.0000 mg | Freq: Once | INTRAMUSCULAR | Status: DC | PRN

## 2011-11-09 MED ORDER — LACTATED RINGERS IV SOLN
INTRAVENOUS | Status: DC
  Administered 2011-11-09: 1000 mL via INTRAVENOUS

## 2011-11-09 MED ORDER — FENTANYL CITRATE 0.05 MG/ML IJ SOLN
INTRAMUSCULAR | Status: DC | PRN
  Administered 2011-11-09: 20 ug via INTRATHECAL
  Administered 2011-11-09: 30 ug via INTRAVENOUS
  Administered 2011-11-09: 25 ug via INTRAVENOUS

## 2011-11-09 MED ORDER — LIDOCAINE HCL (PF) 1 % IJ SOLN
INTRAMUSCULAR | Status: AC
  Filled 2011-11-09: qty 5

## 2011-11-09 MED ORDER — FENTANYL CITRATE 0.05 MG/ML IJ SOLN
INTRAMUSCULAR | Status: AC
  Filled 2011-11-09: qty 2

## 2011-11-09 MED ORDER — MIDAZOLAM HCL 2 MG/2ML IJ SOLN
1.0000 mg | INTRAMUSCULAR | Status: DC | PRN
  Administered 2011-11-09: 2 mg via INTRAVENOUS

## 2011-11-09 SURGICAL SUPPLY — 45 items
ADH SKN CLS APL DERMABOND .7 (GAUZE/BANDAGES/DRESSINGS) ×1
BAG DRAIN URO TABLE W/ADPT NS (DRAPE) ×1 IMPLANT
BAG DRN 8 ADPR NS SKTRN CSTL (DRAPE) ×1
BAG URINE DRAINAGE (UROLOGICAL SUPPLIES) ×1 IMPLANT
CATH FOLEY 2WAY SLVR  5CC 18FR (CATHETERS) ×1
CATH FOLEY 2WAY SLVR  5CC 20FR (CATHETERS) ×1
CATH FOLEY 2WAY SLVR 5CC 18FR (CATHETERS) ×1 IMPLANT
CATH FOLEY 2WAY SLVR 5CC 20FR (CATHETERS) ×1 IMPLANT
CLOTH BEACON ORANGE TIMEOUT ST (SAFETY) ×2 IMPLANT
COVER LIGHT HANDLE STERIS (MISCELLANEOUS) ×4 IMPLANT
DERMABOND ADVANCED (GAUZE/BANDAGES/DRESSINGS) ×1
DERMABOND ADVANCED .7 DNX12 (GAUZE/BANDAGES/DRESSINGS) IMPLANT
ELECT REM PT RETURN 9FT ADLT (ELECTROSURGICAL) ×2
ELECTRODE REM PT RTRN 9FT ADLT (ELECTROSURGICAL) ×1 IMPLANT
GLOVE BIO SURGEON STRL SZ7 (GLOVE) ×2 IMPLANT
GLOVE BIOGEL PI IND STRL 7.0 (GLOVE) IMPLANT
GLOVE BIOGEL PI INDICATOR 7.0 (GLOVE) ×2
GLOVE EXAM NITRILE MD LF STRL (GLOVE) ×1 IMPLANT
GLOVE SS BIOGEL STRL SZ 6.5 (GLOVE) IMPLANT
GLOVE SUPERSENSE BIOGEL SZ 6.5 (GLOVE) ×3
GOWN STRL REIN XL XLG (GOWN DISPOSABLE) ×6 IMPLANT
INST SET MINOR GENERAL (KITS) ×2 IMPLANT
IV NS IRRIG 3000ML ARTHROMATIC (IV SOLUTION) ×2 IMPLANT
KIT ROOM TURNOVER AP CYSTO (KITS) ×2 IMPLANT
LUBRICANT JELLY 4.5OZ STERILE (MISCELLANEOUS) ×2 IMPLANT
MANIFOLD NEPTUNE II (INSTRUMENTS) ×2 IMPLANT
NDL HYPO 18GX1.5 BLUNT FILL (NEEDLE) ×1 IMPLANT
NEEDLE HYPO 18GX1.5 BLUNT FILL (NEEDLE) ×2 IMPLANT
NEEDLE HYPO 22GX1.5 SAFETY (NEEDLE) ×2 IMPLANT
NS IRRIG 1000ML POUR BTL (IV SOLUTION) ×2 IMPLANT
PACK PERI GYN (CUSTOM PROCEDURE TRAY) ×2 IMPLANT
PAD ARMBOARD 7.5X6 YLW CONV (MISCELLANEOUS) ×2 IMPLANT
Retropubic Urethral Support System (Sling) ×1 IMPLANT
SET BASIN LINEN APH (SET/KITS/TRAYS/PACK) ×2 IMPLANT
SET IRRIG Y TYPE TUR BLADDER L (SET/KITS/TRAYS/PACK) ×2 IMPLANT
SPONGE INTESTINAL PEANUT (DISPOSABLE) ×1 IMPLANT
SUT PROLENE MO 6 BLUE #0 30IN (SUTURE) ×1 IMPLANT
SUT SILK 0 FSL (SUTURE) IMPLANT
SUT VIC AB 3-0 SH 27 (SUTURE) ×4
SUT VIC AB 3-0 SH 27X BRD (SUTURE) ×1 IMPLANT
SYR BULB IRRIGATION 50ML (SYRINGE) ×2 IMPLANT
SYR CONTROL 10ML LL (SYRINGE) ×3 IMPLANT
SYRINGE 10CC LL (SYRINGE) ×2 IMPLANT
WATER STERILE IRR 1000ML POUR (IV SOLUTION) ×2 IMPLANT
YANKAUER SUCT 12FT TUBE ARGYLE (SUCTIONS) ×2 IMPLANT

## 2011-11-09 NOTE — Preoperative (Signed)
Beta Blockers   Reason not to administer Beta Blockers:Not Applicable 

## 2011-11-09 NOTE — Anesthesia Postprocedure Evaluation (Signed)
  Anesthesia Post-op Note  Patient: [name]  Procedure(s) Performed: Procedure(s) (LRB): TRANSVAGINAL TAPE (TVT) PROCEDURE (N/A)  Patient Location: PACU  Anesthesia Type: Spinal  Level of Consciousness: awake, alert , oriented and patient cooperative  Airway and Oxygen Therapy: Patient Spontanous Breathing and Patient connected to face mask oxygen  Post-op Pain: none  Post-op Assessment: Post-op Vital signs reviewed, Patient's Cardiovascular Status Stable, Respiratory Function Stable, Patent Airway and No signs of Nausea or vomiting  Post-op Vital Signs: Reviewed and stable  Complications: No apparent anesthesia complications

## 2011-11-09 NOTE — Anesthesia Preprocedure Evaluation (Signed)
Anesthesia Evaluation  Patient identified by MRN, date of birth, ID band Patient awake    Reviewed: Allergy & Precautions, H&P , NPO status , Patient's Chart, lab work & pertinent test results, reviewed documented beta blocker date and time   History of Anesthesia Complications Negative for: history of anesthetic complications  Airway Mallampati: II      Dental  (+) Edentulous Upper and Edentulous Lower   Pulmonary shortness of breath and with exertion, COPDformer smoker,  breath sounds clear to auscultation        Cardiovascular hypertension, + angina with exertion + CAD and + Past MI Rhythm:Regular Rate:Normal     Neuro/Psych PSYCHIATRIC DISORDERS Anxiety Depression    GI/Hepatic GERD-  Medicated,  Endo/Other    Renal/GU      Musculoskeletal   Abdominal   Peds  Hematology   Anesthesia Other Findings   Reproductive/Obstetrics                           Anesthesia Physical Anesthesia Plan  ASA: III  Anesthesia Plan: Spinal   Post-op Pain Management:    Induction:   Airway Management Planned: Nasal Cannula  Additional Equipment:   Intra-op Plan:   Post-operative Plan:   Informed Consent: I have reviewed the patients History and Physical, chart, labs and discussed the procedure including the risks, benefits and alternatives for the proposed anesthesia with the patient or authorized representative who has indicated his/her understanding and acceptance.     Plan Discussed with:   Anesthesia Plan Comments:         Anesthesia Quick Evaluation

## 2011-11-09 NOTE — Progress Notes (Signed)
Awake. Raises bottom off stretcher. Moves legs without difficulty. Denies pain.

## 2011-11-09 NOTE — Progress Notes (Signed)
Dr Patsey Berthold at bedside to check pt. No new orders given.

## 2011-11-09 NOTE — Anesthesia Procedure Notes (Addendum)
Date/Time: 11/09/2011 9:30 AM Performed by: Antony Contras, Wiliam Cauthorn L Pre-anesthesia Checklist: Patient identified, Patient being monitored, Emergency Drugs available, Timeout performed and Suction available Oxygen Delivery Method: Non-rebreather mask   Spinal  Patient location during procedure: OR Start time: 11/09/2011 9:30 AM Staffing Anesthesiologist: Lerry Liner CRNA/Resident: ANDRAZA, Maudine Kluesner L Preanesthetic Checklist Completed: patient identified, site marked, surgical consent, pre-op evaluation, timeout performed, IV checked, risks and benefits discussed and monitors and equipment checked Spinal Block Patient position: left lateral decubitus Prep: Betadine Patient monitoring: heart rate, cardiac monitor, continuous pulse ox and blood pressure Approach: left paramedian Location: L3-4 Injection technique: single-shot Needle Needle type: Spinocan  Needle gauge: 22 G Needle length: 9 cm Assessment Sensory level: T8 Additional Notes  ATTEMPTS:3 Attempt x2 By Antony Contras, CRNA; Attempt x1 by Dr. Beryle Lathe [id number] TRAY EXPIRATION DATE:09/2012  Bupivacaine .75% 1.5cc with Fentanyl 20 mcg injected intrathecally at 09:30 by Dr. Duwayne Heck; Patient tolerated well

## 2011-11-09 NOTE — H&P (Signed)
NAME:  [name], [name] NO.:  [id number]  MEDICAL RECORD NO.:  [id number]  LOCATION:  [facility]                         FACILITY:  [facility]  PHYSICIAN:  Marissa Nestle, M.D.DATE OF BIRTH:  [birth year]  DATE OF ADMISSION:  10/30/2011 DATE OF DISCHARGE:  LH                             HISTORY & PHYSICAL   A 78 year old female who was evaluated by me when she came to see me on Aug 28, 2011, with complaint of urinary incontinence.  She has marked stress incontinence and uses several pads a day, also has urgency, urge incontinence.  Complains of pain in both the groin since she had surgery by Dr. Barrie Dunker.  She says that she had a mesh placement and she thinks she is having pain because of that use.  She also has urgency and urge incontinence.  Complains of lower backache also.  No gross hematuria, fever, chills, or any voiding difficulty.  She underwent complete workup associated with CT abdomen and pelvis with and without contrast is essentially negative.  Cystoscopy shows that cystometric is normal and she has marked stress incontinence which goes away with the elevation of the bladder neck.  So I recommended that she can benefit from  DVT, then I discussed in detail about the problem and the surgeries, limitation, complication, especially urinary retention leading to removal of the tape, no guarantees are given about the results.  She want me to try this procedure for which she is coming out as outpatient.  We will do the procedure as outpatient.  PAST MEDICAL HISTORY:  History of having internal hemorrhoids, dysphagia unspecified, history of colitis, GE reflux.  PAST SURGICAL HISTORY:  Include tonsillectomy, hysterectomy, cholecystectomy, right knee replacement, right leg benign tumor removed, left breast lumpectomy, left hand surgery, left rotator cuff surgery, sinus surgery.  She also has vaginal vault repair.  She had severe prolapse of the vaginal vault and I  referred her to Dr. Barrie Dunker and she was tried past 3 treatment for couple of times for several weeks, but it did not work out for her, so eventually she underwent surgical repair of vaginal prolapse.  No vaginal mesh was used by Dr. Barrie Dunker and when she was having this pain, she was under the impression that it is because of the mesh she had been used, so I sent her back and Dr. Barrie Dunker has re- examined her.  He has not used the mesh.  He explained to her she understands now.  Her complaint of urinary incontinence according to notes from Dr. Barrie Dunker she did not have stress incontinence, but she has history of having marked stress incontinence because I have seen her leaking urine on coughing.  She also has urgency, urge incontinence, and irritation of the skin because of the leakage of urine and use of pads.  PERSONAL HISTORY:  She does not smoke or drink.  REVIEW OF SYSTEMS:  Unremarkable.  PHYSICAL EXAMINATION:  VITAL SIGNS:  Her blood pressure 149/68, temperature is 98.4. CENTRAL NERVOUS SYSTEM:  No gross neurological deficit. HEAD, NECK, EYE, AND ENT:  Negative. CHEST:  Symmetrical. HEART:  Regular sinus rhythm.  No murmur. ABDOMEN:  Soft, flat.  Liver, spleen,  kidneys are not palpable.  No CVA tenderness. PELVIC:  No adnexal mass or tenderness.  There is moderate excoriation of the skin next to the vulva on both sides.  IMPRESSION:  Mixed urinary incontinence.  PLAN:  TVT under anesthesia as outpatient tomorrow.  I have explained the patient procedures, limitation, complication, especially urinary retention leading to removal of the tape.  She understands and want me to go ahead and try.  No guarantees about the results.     Marissa Nestle, M.D.     MIJ/MEDQ  D:  11/08/2011  T:  11/09/2011  Job:  [id number]

## 2011-11-09 NOTE — Brief Op Note (Signed)
11/09/2011  10:39 AM  PATIENT:  [name]  78 y.o. female  PRE-OPERATIVE DIAGNOSIS:  stress urinary incontinence  POST-OPERATIVE DIAGNOSIS:  stress urinary incontinence  PROCEDURE:  Procedure(s) (LRB): TRANSVAGINAL TAPE (TVT) PROCEDURE (N/A)  SURGEON:  Surgeon(s) and Role:    * Marissa Nestle, MD - Primary  PHYSICIAN ASSISTANT:   ASSISTANTS: none   ANESTHESIA:   spinal  EBL:  Total I/O In: 1000 [I.V.:1000] Out: -   BLOOD ADMINISTERED:none  DRAINS: none   LOCAL MEDICATIONS USED:  NONE  SPECIMEN:  No Specimen  DISPOSITION OF SPECIMEN:  N/A  COUNTS:  YES  TOURNIQUET:  * No tourniquets in log *  DICTATION: .Other Dictation: Dictation Number [id number]  PLAN OF CARE: Discharge to home after PACU  PATIENT DISPOSITION:  PACU - hemodynamically stable.   Delay start of Pharmacological VTE agent (>24hrs) due to surgical blood loss or risk of bleeding:

## 2011-11-09 NOTE — Transfer of Care (Signed)
Immediate Anesthesia Transfer of Care Note  Patient: [name]  Procedure(s) Performed: Procedure(s) (LRB): TRANSVAGINAL TAPE (TVT) PROCEDURE (N/A)  Patient Location: PACU  Anesthesia Type: Spinal  Level of Consciousness: awake, alert , oriented and patient cooperative  Airway & Oxygen Therapy: Patient Spontanous Breathing  Post-op Assessment: Report given to PACU RN and Post -op Vital signs reviewed and stable  Post vital signs: Reviewed and stable  Complications: No apparent anesthesia complications

## 2011-11-09 NOTE — Progress Notes (Signed)
Awake. Ginger-ale given to drink. Tolerated well. Sensation level at bilateral toes. Moves bilateral feet without difficulty. Unable to bend knees. Will continue to monitor.

## 2011-11-09 NOTE — Progress Notes (Signed)
Awake. Denies pain. No vag drainage. Peri pad applied.

## 2011-11-10 NOTE — Op Note (Signed)
[name], [name]                ACCOUNT NO.:  [id number]  MEDICAL RECORD NO.:  [id number]  LOCATION:  [facility]                          FACILITY:  [facility]  PHYSICIAN:  Marissa Nestle, M.D.DATE OF BIRTH:  1933-01-11  DATE OF PROCEDURE: DATE OF DISCHARGE:  11/09/2011                              OPERATIVE REPORT   PREOPERATIVE DIAGNOSIS:  Stress urinary incontinence.  POSTOPERATIVE DIAGNOSIS:  Stress urinary incontinence.  PROCEDURE:  TVT.  ANESTHESIA:  Spinal.  PROCEDURE IN DETAIL:  The patient was placed under spinal anesthesia in lithotomy position, usual prep and drape.  A #20 Foley catheter was inserted into the bladder.  The base of the mid urethra was infiltrated with 10 mL of normal saline, and the bladder neck was identified.  Mid urethral incision about 1 cm long was made right over the urethra.  With blunt dissection periurethral space was developed for the introduction of the needle.  I already had marked the suprapubic site on both side of the midline at the level of the pubic tubercle.  Now, I removed the Foley catheter and introduced with catheter with a stylet.  Deflected the bladder neck to the right side and introduced the TVT needle on the left side at the mid urethra and advanced it behind the pubic symphysis, came out at the level of the previously marked site, level of the pubic tubercle.  Once the needle was in place, I tied the needle with the TVT of green tubing and pulled the green tubing to the suprapubic side.  At this point, Foley catheter was removed and the right angled lens cystoscope was introduced into the bladder.  Bladder was filled up with 300 mL saline to make sure the bladder was well distended and the green tubing was outside the bladder, which was right.  Now the bladder was emptied and the Foley catheter with stylet was introduced again into the urethra.  At this time, bladder neck was deflected to the left side, and I introduced the  curved needle in the mid urethra level on the right side.  I advanced the needle behind the pubic symphysis, came out of the previously marked site and I tied the needle to the green tubing on this side and pulled the green tubing up in the suprapubic area and inspected the bladder again with right-angle lens to make sure the green tubing was outside the bladder which it is.  Now, introduced #20 Foley catheter bag into the bladder and inflated the balloon to 5 mL and pulled the tape through the suprapubic side equally on both sides and I introduced curved Mayo scissor in between the tape and the urethra, so that it stays loose.  I also passed a Prolene tie around the tape right at the level of the mid urethra to make sure that in case I want to go back and find the tape it was easily visible and approachable with the help of the tie.  Prolene tie is held with hemostat and the Mayo scissor was removed.  The tape was lying gently against the urethra, not too tight. After pulling the tape equally on both sides and the tab from the  tape has been removed.  Now the tape was lying gently against the urethra. The redundant part of the tape in the suprapubic area was cut at the level of the skin.  Prolene stitch has been removed.  I continued to close the vaginal incision with interrupted sutures of 3-0 Vicryl. After making sure it was completely closed and there was no bleeding going on.  It was completely dry.  I once again looked into the bladder with the right-angled lens to make sure the tape was outside the bladder which it is, and left about 250 mL of water or saline in the bladder, and so that we can check in the recovery room if she was voiding satisfactorily.  All the instruments were removed.  The patient was not bleeding and was dry. She was allergic to lot of antibiotics, so we gave her 1 g of vancomycin.  It was given before the start of the procedure.  The patient left the operating  room in satisfactory condition.     Marissa Nestle, M.D.     MIJ/MEDQ  D:  11/09/2011  T:  11/10/2011  Job:  [id number]

## 2011-11-11 DIAGNOSIS — Z7982 Long term (current) use of aspirin: Secondary | ICD-10-CM | POA: Diagnosis not present

## 2011-11-11 DIAGNOSIS — Z79899 Other long term (current) drug therapy: Secondary | ICD-10-CM | POA: Diagnosis not present

## 2011-11-11 DIAGNOSIS — R21 Rash and other nonspecific skin eruption: Secondary | ICD-10-CM | POA: Diagnosis not present

## 2011-11-11 DIAGNOSIS — I10 Essential (primary) hypertension: Secondary | ICD-10-CM | POA: Diagnosis not present

## 2011-11-13 ENCOUNTER — Encounter (HOSPITAL_COMMUNITY): Payer: Self-pay | Admitting: Urology

## 2011-12-17 DIAGNOSIS — Z7982 Long term (current) use of aspirin: Secondary | ICD-10-CM | POA: Diagnosis not present

## 2011-12-17 DIAGNOSIS — Z880 Allergy status to penicillin: Secondary | ICD-10-CM | POA: Diagnosis not present

## 2011-12-17 DIAGNOSIS — J984 Other disorders of lung: Secondary | ICD-10-CM | POA: Diagnosis not present

## 2011-12-17 DIAGNOSIS — R0902 Hypoxemia: Secondary | ICD-10-CM | POA: Diagnosis not present

## 2011-12-17 DIAGNOSIS — R918 Other nonspecific abnormal finding of lung field: Secondary | ICD-10-CM | POA: Diagnosis not present

## 2011-12-17 DIAGNOSIS — Z885 Allergy status to narcotic agent status: Secondary | ICD-10-CM | POA: Diagnosis not present

## 2011-12-17 DIAGNOSIS — Z79899 Other long term (current) drug therapy: Secondary | ICD-10-CM | POA: Diagnosis not present

## 2011-12-17 DIAGNOSIS — J811 Chronic pulmonary edema: Secondary | ICD-10-CM | POA: Diagnosis not present

## 2011-12-17 DIAGNOSIS — R0609 Other forms of dyspnea: Secondary | ICD-10-CM | POA: Diagnosis not present

## 2011-12-17 DIAGNOSIS — N390 Urinary tract infection, site not specified: Secondary | ICD-10-CM | POA: Diagnosis not present

## 2011-12-17 DIAGNOSIS — I2510 Atherosclerotic heart disease of native coronary artery without angina pectoris: Secondary | ICD-10-CM | POA: Diagnosis not present

## 2011-12-17 DIAGNOSIS — R0602 Shortness of breath: Secondary | ICD-10-CM | POA: Diagnosis not present

## 2011-12-17 DIAGNOSIS — G4733 Obstructive sleep apnea (adult) (pediatric): Secondary | ICD-10-CM | POA: Diagnosis not present

## 2011-12-17 DIAGNOSIS — Z888 Allergy status to other drugs, medicaments and biological substances status: Secondary | ICD-10-CM | POA: Diagnosis not present

## 2011-12-17 DIAGNOSIS — J449 Chronic obstructive pulmonary disease, unspecified: Secondary | ICD-10-CM | POA: Diagnosis not present

## 2011-12-17 DIAGNOSIS — R509 Fever, unspecified: Secondary | ICD-10-CM | POA: Diagnosis not present

## 2011-12-17 DIAGNOSIS — F411 Generalized anxiety disorder: Secondary | ICD-10-CM | POA: Diagnosis not present

## 2011-12-17 DIAGNOSIS — K219 Gastro-esophageal reflux disease without esophagitis: Secondary | ICD-10-CM | POA: Diagnosis not present

## 2011-12-17 DIAGNOSIS — J189 Pneumonia, unspecified organism: Secondary | ICD-10-CM

## 2011-12-17 DIAGNOSIS — J440 Chronic obstructive pulmonary disease with acute lower respiratory infection: Secondary | ICD-10-CM | POA: Diagnosis not present

## 2011-12-17 DIAGNOSIS — Z87891 Personal history of nicotine dependence: Secondary | ICD-10-CM | POA: Diagnosis not present

## 2011-12-17 DIAGNOSIS — Z883 Allergy status to other anti-infective agents status: Secondary | ICD-10-CM | POA: Diagnosis not present

## 2011-12-17 HISTORY — DX: Pneumonia, unspecified organism: J18.9

## 2011-12-18 DIAGNOSIS — J449 Chronic obstructive pulmonary disease, unspecified: Secondary | ICD-10-CM | POA: Diagnosis not present

## 2011-12-18 DIAGNOSIS — J40 Bronchitis, not specified as acute or chronic: Secondary | ICD-10-CM | POA: Diagnosis not present

## 2011-12-18 DIAGNOSIS — N390 Urinary tract infection, site not specified: Secondary | ICD-10-CM | POA: Diagnosis not present

## 2011-12-19 DIAGNOSIS — N390 Urinary tract infection, site not specified: Secondary | ICD-10-CM | POA: Diagnosis not present

## 2011-12-19 DIAGNOSIS — J40 Bronchitis, not specified as acute or chronic: Secondary | ICD-10-CM | POA: Diagnosis not present

## 2011-12-19 DIAGNOSIS — J449 Chronic obstructive pulmonary disease, unspecified: Secondary | ICD-10-CM | POA: Diagnosis not present

## 2011-12-20 DIAGNOSIS — J40 Bronchitis, not specified as acute or chronic: Secondary | ICD-10-CM | POA: Diagnosis not present

## 2011-12-20 DIAGNOSIS — N390 Urinary tract infection, site not specified: Secondary | ICD-10-CM | POA: Diagnosis not present

## 2011-12-20 DIAGNOSIS — J449 Chronic obstructive pulmonary disease, unspecified: Secondary | ICD-10-CM | POA: Diagnosis not present

## 2011-12-21 DIAGNOSIS — J449 Chronic obstructive pulmonary disease, unspecified: Secondary | ICD-10-CM | POA: Diagnosis not present

## 2011-12-21 DIAGNOSIS — R918 Other nonspecific abnormal finding of lung field: Secondary | ICD-10-CM | POA: Diagnosis not present

## 2011-12-21 DIAGNOSIS — J984 Other disorders of lung: Secondary | ICD-10-CM | POA: Diagnosis not present

## 2011-12-21 DIAGNOSIS — N390 Urinary tract infection, site not specified: Secondary | ICD-10-CM | POA: Diagnosis not present

## 2011-12-21 DIAGNOSIS — I700 Atherosclerosis of aorta: Secondary | ICD-10-CM | POA: Diagnosis not present

## 2011-12-28 DIAGNOSIS — G4733 Obstructive sleep apnea (adult) (pediatric): Secondary | ICD-10-CM | POA: Insufficient documentation

## 2011-12-28 DIAGNOSIS — J449 Chronic obstructive pulmonary disease, unspecified: Secondary | ICD-10-CM | POA: Diagnosis not present

## 2012-01-10 DIAGNOSIS — I10 Essential (primary) hypertension: Secondary | ICD-10-CM | POA: Diagnosis not present

## 2012-01-10 DIAGNOSIS — G4710 Hypersomnia, unspecified: Secondary | ICD-10-CM | POA: Diagnosis not present

## 2012-01-10 DIAGNOSIS — G4730 Sleep apnea, unspecified: Secondary | ICD-10-CM | POA: Diagnosis not present

## 2012-01-24 DIAGNOSIS — Z6836 Body mass index (BMI) 36.0-36.9, adult: Secondary | ICD-10-CM | POA: Diagnosis not present

## 2012-01-24 DIAGNOSIS — G4710 Hypersomnia, unspecified: Secondary | ICD-10-CM | POA: Diagnosis not present

## 2012-01-24 DIAGNOSIS — R5381 Other malaise: Secondary | ICD-10-CM | POA: Diagnosis not present

## 2012-01-24 DIAGNOSIS — G4761 Periodic limb movement disorder: Secondary | ICD-10-CM | POA: Diagnosis not present

## 2012-01-24 DIAGNOSIS — R0609 Other forms of dyspnea: Secondary | ICD-10-CM | POA: Diagnosis not present

## 2012-01-31 DIAGNOSIS — G2581 Restless legs syndrome: Secondary | ICD-10-CM | POA: Diagnosis not present

## 2012-01-31 DIAGNOSIS — R0609 Other forms of dyspnea: Secondary | ICD-10-CM | POA: Diagnosis not present

## 2012-01-31 DIAGNOSIS — R0989 Other specified symptoms and signs involving the circulatory and respiratory systems: Secondary | ICD-10-CM | POA: Diagnosis not present

## 2012-03-06 DIAGNOSIS — M5030 Other cervical disc degeneration, unspecified cervical region: Secondary | ICD-10-CM | POA: Diagnosis not present

## 2012-03-06 DIAGNOSIS — G2581 Restless legs syndrome: Secondary | ICD-10-CM | POA: Diagnosis not present

## 2012-06-05 DIAGNOSIS — M1990 Unspecified osteoarthritis, unspecified site: Secondary | ICD-10-CM | POA: Insufficient documentation

## 2012-06-11 DIAGNOSIS — IMO0001 Reserved for inherently not codable concepts without codable children: Secondary | ICD-10-CM | POA: Diagnosis not present

## 2012-06-11 DIAGNOSIS — M47817 Spondylosis without myelopathy or radiculopathy, lumbosacral region: Secondary | ICD-10-CM | POA: Diagnosis not present

## 2012-06-12 DIAGNOSIS — R1310 Dysphagia, unspecified: Secondary | ICD-10-CM | POA: Diagnosis not present

## 2012-06-12 DIAGNOSIS — M5030 Other cervical disc degeneration, unspecified cervical region: Secondary | ICD-10-CM | POA: Diagnosis not present

## 2012-06-12 DIAGNOSIS — G2581 Restless legs syndrome: Secondary | ICD-10-CM | POA: Diagnosis not present

## 2012-06-13 DIAGNOSIS — M47817 Spondylosis without myelopathy or radiculopathy, lumbosacral region: Secondary | ICD-10-CM | POA: Diagnosis not present

## 2012-06-13 DIAGNOSIS — IMO0001 Reserved for inherently not codable concepts without codable children: Secondary | ICD-10-CM | POA: Diagnosis not present

## 2012-06-17 DIAGNOSIS — R1310 Dysphagia, unspecified: Secondary | ICD-10-CM | POA: Diagnosis not present

## 2012-06-18 DIAGNOSIS — M545 Low back pain: Secondary | ICD-10-CM | POA: Diagnosis not present

## 2012-06-18 DIAGNOSIS — IMO0001 Reserved for inherently not codable concepts without codable children: Secondary | ICD-10-CM | POA: Diagnosis not present

## 2012-06-20 DIAGNOSIS — IMO0001 Reserved for inherently not codable concepts without codable children: Secondary | ICD-10-CM | POA: Diagnosis not present

## 2012-06-20 DIAGNOSIS — M545 Low back pain: Secondary | ICD-10-CM | POA: Diagnosis not present

## 2012-06-25 DIAGNOSIS — IMO0001 Reserved for inherently not codable concepts without codable children: Secondary | ICD-10-CM | POA: Diagnosis not present

## 2012-06-25 DIAGNOSIS — M545 Low back pain: Secondary | ICD-10-CM | POA: Diagnosis not present

## 2012-06-27 DIAGNOSIS — R1310 Dysphagia, unspecified: Secondary | ICD-10-CM | POA: Diagnosis not present

## 2012-06-27 DIAGNOSIS — G2581 Restless legs syndrome: Secondary | ICD-10-CM | POA: Diagnosis not present

## 2012-06-27 DIAGNOSIS — M545 Low back pain: Secondary | ICD-10-CM | POA: Diagnosis not present

## 2012-06-27 DIAGNOSIS — R0601 Orthopnea: Secondary | ICD-10-CM | POA: Diagnosis not present

## 2012-06-27 DIAGNOSIS — IMO0001 Reserved for inherently not codable concepts without codable children: Secondary | ICD-10-CM | POA: Diagnosis not present

## 2012-07-03 DIAGNOSIS — M545 Low back pain: Secondary | ICD-10-CM | POA: Diagnosis not present

## 2012-07-03 DIAGNOSIS — IMO0001 Reserved for inherently not codable concepts without codable children: Secondary | ICD-10-CM | POA: Diagnosis not present

## 2012-07-08 ENCOUNTER — Telehealth: Payer: Self-pay | Admitting: Cardiology

## 2012-07-08 NOTE — Telephone Encounter (Signed)
Patient going with degent

## 2012-07-11 DIAGNOSIS — IMO0001 Reserved for inherently not codable concepts without codable children: Secondary | ICD-10-CM | POA: Diagnosis not present

## 2012-07-11 DIAGNOSIS — M545 Low back pain: Secondary | ICD-10-CM | POA: Diagnosis not present

## 2012-07-11 DIAGNOSIS — F410 Panic disorder [episodic paroxysmal anxiety] without agoraphobia: Secondary | ICD-10-CM | POA: Insufficient documentation

## 2012-07-18 DIAGNOSIS — M545 Low back pain: Secondary | ICD-10-CM | POA: Diagnosis not present

## 2012-07-18 DIAGNOSIS — F329 Major depressive disorder, single episode, unspecified: Secondary | ICD-10-CM | POA: Diagnosis not present

## 2012-07-18 DIAGNOSIS — M1990 Unspecified osteoarthritis, unspecified site: Secondary | ICD-10-CM | POA: Diagnosis not present

## 2012-07-26 DIAGNOSIS — I2510 Atherosclerotic heart disease of native coronary artery without angina pectoris: Secondary | ICD-10-CM | POA: Diagnosis not present

## 2012-07-26 DIAGNOSIS — I10 Essential (primary) hypertension: Secondary | ICD-10-CM | POA: Diagnosis not present

## 2012-07-26 DIAGNOSIS — R0609 Other forms of dyspnea: Secondary | ICD-10-CM | POA: Diagnosis not present

## 2012-08-08 DIAGNOSIS — R300 Dysuria: Secondary | ICD-10-CM | POA: Diagnosis not present

## 2012-08-08 DIAGNOSIS — M545 Low back pain: Secondary | ICD-10-CM | POA: Diagnosis not present

## 2012-08-08 DIAGNOSIS — R319 Hematuria, unspecified: Secondary | ICD-10-CM | POA: Diagnosis not present

## 2012-08-08 DIAGNOSIS — I10 Essential (primary) hypertension: Secondary | ICD-10-CM | POA: Diagnosis not present

## 2012-08-15 DIAGNOSIS — R3919 Other difficulties with micturition: Secondary | ICD-10-CM | POA: Diagnosis not present

## 2012-08-15 DIAGNOSIS — N390 Urinary tract infection, site not specified: Secondary | ICD-10-CM | POA: Diagnosis not present

## 2012-08-19 DIAGNOSIS — R350 Frequency of micturition: Secondary | ICD-10-CM | POA: Diagnosis not present

## 2012-08-19 DIAGNOSIS — R300 Dysuria: Secondary | ICD-10-CM | POA: Diagnosis not present

## 2012-08-22 ENCOUNTER — Ambulatory Visit: Payer: Medicare Other | Admitting: Cardiology

## 2012-09-05 DIAGNOSIS — J019 Acute sinusitis, unspecified: Secondary | ICD-10-CM | POA: Diagnosis not present

## 2012-09-05 DIAGNOSIS — R05 Cough: Secondary | ICD-10-CM | POA: Diagnosis not present

## 2012-09-05 DIAGNOSIS — R079 Chest pain, unspecified: Secondary | ICD-10-CM | POA: Diagnosis not present

## 2012-09-13 ENCOUNTER — Encounter: Payer: Self-pay | Admitting: *Deleted

## 2012-09-16 ENCOUNTER — Ambulatory Visit (INDEPENDENT_AMBULATORY_CARE_PROVIDER_SITE_OTHER): Payer: Medicare Other | Admitting: Obstetrics & Gynecology

## 2012-09-16 ENCOUNTER — Encounter: Payer: Self-pay | Admitting: Obstetrics & Gynecology

## 2012-09-16 VITALS — BP 150/80 | Ht 61.0 in | Wt 200.0 lb

## 2012-09-16 DIAGNOSIS — A58 Granuloma inguinale: Secondary | ICD-10-CM | POA: Diagnosis not present

## 2012-09-16 DIAGNOSIS — N898 Other specified noninflammatory disorders of vagina: Secondary | ICD-10-CM

## 2012-09-16 NOTE — Progress Notes (Signed)
Patient ID: [name], female   DOB: 22-Feb-1933, 79 y.o.   MRN: [id number] I saw patient about 18 months ago for vaginal granulation tissue and dr Michela Pitcher wants me to check it again.  On exam there longer is any vaginal granulation tissue.  The silver nitrate treated it and no other tissue has come back  No follow up needed.

## 2012-09-19 DIAGNOSIS — M545 Low back pain: Secondary | ICD-10-CM | POA: Diagnosis not present

## 2012-09-24 DIAGNOSIS — M47817 Spondylosis without myelopathy or radiculopathy, lumbosacral region: Secondary | ICD-10-CM | POA: Diagnosis not present

## 2012-10-09 ENCOUNTER — Other Ambulatory Visit: Payer: Self-pay | Admitting: Neurosurgery

## 2012-10-09 DIAGNOSIS — M545 Low back pain: Secondary | ICD-10-CM | POA: Diagnosis not present

## 2012-10-09 DIAGNOSIS — M48061 Spinal stenosis, lumbar region without neurogenic claudication: Secondary | ICD-10-CM | POA: Diagnosis not present

## 2012-10-10 ENCOUNTER — Encounter (HOSPITAL_COMMUNITY): Payer: Self-pay | Admitting: Pharmacy Technician

## 2012-10-10 ENCOUNTER — Encounter (HOSPITAL_COMMUNITY): Payer: Self-pay | Admitting: Neurosurgery

## 2012-10-10 NOTE — Progress Notes (Signed)
10/10/12 1843  OBSTRUCTIVE SLEEP APNEA  Have you ever been diagnosed with sleep apnea through a sleep study? No  If yes, do you have and use a CPAP or BPAP machine every night? 0  Do you know the presssure settings on your maching? No  Do you snore loudly (loud enough to be heard through closed doors)?  1  Do you often feel tired, fatigued, or sleepy during the daytime? 1  Has anyone observed you stop breathing during your sleep? 1  Do you have, or are you being treated for high blood pressure? 1  BMI more than 35 kg/m2? 1  Age over 18 years old? 1  Obstructive Sleep Apnea Score 6

## 2012-10-11 ENCOUNTER — Inpatient Hospital Stay (HOSPITAL_COMMUNITY): Payer: Medicare Other

## 2012-10-11 ENCOUNTER — Inpatient Hospital Stay (HOSPITAL_COMMUNITY)
Admission: RE | Admit: 2012-10-11 | Discharge: 2012-10-14 | DRG: 491 | Disposition: A | Discharge status: Home / Self Care | Payer: Medicare Other | Source: Ambulatory Visit | Attending: Neurosurgery | Admitting: Neurosurgery

## 2012-10-11 ENCOUNTER — Encounter (HOSPITAL_COMMUNITY)
Admission: RE | Disposition: A | Discharge status: Home / Self Care | Payer: Self-pay | Source: Ambulatory Visit | Attending: Neurosurgery

## 2012-10-11 ENCOUNTER — Inpatient Hospital Stay (HOSPITAL_COMMUNITY): Payer: Medicare Other | Admitting: Registered Nurse

## 2012-10-11 ENCOUNTER — Encounter (HOSPITAL_COMMUNITY): Payer: Self-pay | Admitting: Registered Nurse

## 2012-10-11 ENCOUNTER — Encounter (HOSPITAL_COMMUNITY): Payer: Self-pay | Admitting: *Deleted

## 2012-10-11 DIAGNOSIS — Z87891 Personal history of nicotine dependence: Secondary | ICD-10-CM | POA: Diagnosis not present

## 2012-10-11 DIAGNOSIS — M48062 Spinal stenosis, lumbar region with neurogenic claudication: Principal | ICD-10-CM | POA: Diagnosis present

## 2012-10-11 DIAGNOSIS — J4489 Other specified chronic obstructive pulmonary disease: Secondary | ICD-10-CM | POA: Diagnosis not present

## 2012-10-11 DIAGNOSIS — Z79899 Other long term (current) drug therapy: Secondary | ICD-10-CM | POA: Diagnosis not present

## 2012-10-11 DIAGNOSIS — E669 Obesity, unspecified: Secondary | ICD-10-CM | POA: Diagnosis present

## 2012-10-11 DIAGNOSIS — I10 Essential (primary) hypertension: Secondary | ICD-10-CM | POA: Diagnosis not present

## 2012-10-11 DIAGNOSIS — M539 Dorsopathy, unspecified: Secondary | ICD-10-CM | POA: Diagnosis not present

## 2012-10-11 DIAGNOSIS — E785 Hyperlipidemia, unspecified: Secondary | ICD-10-CM | POA: Diagnosis present

## 2012-10-11 DIAGNOSIS — M129 Arthropathy, unspecified: Secondary | ICD-10-CM | POA: Diagnosis present

## 2012-10-11 DIAGNOSIS — F411 Generalized anxiety disorder: Secondary | ICD-10-CM | POA: Diagnosis present

## 2012-10-11 DIAGNOSIS — I2510 Atherosclerotic heart disease of native coronary artery without angina pectoris: Secondary | ICD-10-CM | POA: Diagnosis present

## 2012-10-11 DIAGNOSIS — M545 Low back pain, unspecified: Secondary | ICD-10-CM | POA: Diagnosis not present

## 2012-10-11 DIAGNOSIS — M48061 Spinal stenosis, lumbar region without neurogenic claudication: Secondary | ICD-10-CM | POA: Diagnosis not present

## 2012-10-11 DIAGNOSIS — Z96659 Presence of unspecified artificial knee joint: Secondary | ICD-10-CM

## 2012-10-11 DIAGNOSIS — K219 Gastro-esophageal reflux disease without esophagitis: Secondary | ICD-10-CM | POA: Diagnosis present

## 2012-10-11 DIAGNOSIS — I739 Peripheral vascular disease, unspecified: Secondary | ICD-10-CM | POA: Diagnosis present

## 2012-10-11 DIAGNOSIS — J449 Chronic obstructive pulmonary disease, unspecified: Secondary | ICD-10-CM | POA: Diagnosis present

## 2012-10-11 DIAGNOSIS — I252 Old myocardial infarction: Secondary | ICD-10-CM

## 2012-10-11 DIAGNOSIS — F3289 Other specified depressive episodes: Secondary | ICD-10-CM | POA: Diagnosis present

## 2012-10-11 DIAGNOSIS — Z7982 Long term (current) use of aspirin: Secondary | ICD-10-CM | POA: Diagnosis not present

## 2012-10-11 DIAGNOSIS — G4733 Obstructive sleep apnea (adult) (pediatric): Secondary | ICD-10-CM | POA: Diagnosis present

## 2012-10-11 DIAGNOSIS — F329 Major depressive disorder, single episode, unspecified: Secondary | ICD-10-CM | POA: Diagnosis present

## 2012-10-11 DIAGNOSIS — Z01818 Encounter for other preprocedural examination: Secondary | ICD-10-CM | POA: Diagnosis not present

## 2012-10-11 HISTORY — DX: Depression, unspecified: F32.A

## 2012-10-11 HISTORY — DX: Anxiety disorder, unspecified: F41.9

## 2012-10-11 HISTORY — DX: Pneumonia, unspecified organism: J18.9

## 2012-10-11 HISTORY — DX: Unspecified cataract: H26.9

## 2012-10-11 HISTORY — DX: Major depressive disorder, single episode, unspecified: F32.9

## 2012-10-11 HISTORY — PX: LUMBAR LAMINECTOMY/DECOMPRESSION MICRODISCECTOMY: SHX5026

## 2012-10-11 LAB — BASIC METABOLIC PANEL
CO2: 22 mEq/L (ref 19–32)
Calcium: 9.8 mg/dL (ref 8.4–10.5)
Chloride: 103 mEq/L (ref 96–112)
Creatinine, Ser: 1.03 mg/dL (ref 0.50–1.10)
Glucose, Bld: 120 mg/dL — ABNORMAL HIGH (ref 70–99)

## 2012-10-11 LAB — SURGICAL PCR SCREEN: MRSA, PCR: NEGATIVE

## 2012-10-11 LAB — CBC
Hemoglobin: 14.6 g/dL (ref 12.0–15.0)
MCH: 31.8 pg (ref 26.0–34.0)
MCV: 89.1 fL (ref 78.0–100.0)
RBC: 4.59 MIL/uL (ref 3.87–5.11)

## 2012-10-11 SURGERY — LUMBAR LAMINECTOMY/DECOMPRESSION MICRODISCECTOMY 2 LEVELS
Anesthesia: General | Site: Back | Wound class: Clean

## 2012-10-11 MED ORDER — ALBUTEROL SULFATE HFA 108 (90 BASE) MCG/ACT IN AERS
INHALATION_SPRAY | RESPIRATORY_TRACT | Status: DC | PRN
  Administered 2012-10-11: 2 via RESPIRATORY_TRACT

## 2012-10-11 MED ORDER — FENTANYL CITRATE 0.05 MG/ML IJ SOLN
INTRAMUSCULAR | Status: DC | PRN
  Administered 2012-10-11 (×4): 50 ug via INTRAVENOUS

## 2012-10-11 MED ORDER — ONDANSETRON HCL 4 MG/2ML IJ SOLN: 4.0000 mg | INTRAMUSCULAR | Status: DC | PRN

## 2012-10-11 MED ORDER — FUROSEMIDE 20 MG PO TABS
20.0000 mg | ORAL_TABLET | Freq: Every day | ORAL | Status: DC | PRN
  Filled 2012-10-11: qty 1

## 2012-10-11 MED ORDER — PHENYLEPHRINE HCL 10 MG/ML IJ SOLN
INTRAMUSCULAR | Status: DC | PRN
  Administered 2012-10-11: 40 ug via INTRAVENOUS

## 2012-10-11 MED ORDER — SODIUM CHLORIDE 0.9 % IV SOLN
INTRAVENOUS | Status: DC
  Administered 2012-10-11: 23:00:00 via INTRAVENOUS

## 2012-10-11 MED ORDER — SODIUM CHLORIDE 0.9 % IJ SOLN: 3.0000 mL | INTRAMUSCULAR | Status: DC | PRN

## 2012-10-11 MED ORDER — PHENOL 1.4 % MT LIQD: 1.0000 | OROMUCOSAL | Status: DC | PRN

## 2012-10-11 MED ORDER — ACETAMINOPHEN 325 MG PO TABS: 650.0000 mg | ORAL_TABLET | ORAL | Status: DC | PRN

## 2012-10-11 MED ORDER — PANTOPRAZOLE SODIUM 40 MG PO TBEC
40.0000 mg | DELAYED_RELEASE_TABLET | Freq: Every day | ORAL | Status: DC
  Administered 2012-10-11 – 2012-10-14 (×4): 40 mg via ORAL
  Filled 2012-10-11 (×3): qty 1

## 2012-10-11 MED ORDER — VANCOMYCIN HCL IN DEXTROSE 1-5 GM/200ML-% IV SOLN
1000.0000 mg | Freq: Two times a day (BID) | INTRAVENOUS | Status: DC
  Administered 2012-10-11 – 2012-10-13 (×4): 1000 mg via INTRAVENOUS
  Filled 2012-10-11 (×5): qty 200

## 2012-10-11 MED ORDER — GLYCOPYRROLATE 0.2 MG/ML IJ SOLN
INTRAMUSCULAR | Status: DC | PRN
  Administered 2012-10-11: 0.4 mg via INTRAVENOUS

## 2012-10-11 MED ORDER — OXYCODONE-ACETAMINOPHEN 5-325 MG PO TABS
1.0000 | ORAL_TABLET | ORAL | Status: DC | PRN
  Administered 2012-10-12: 2 via ORAL
  Filled 2012-10-11 (×3): qty 2

## 2012-10-11 MED ORDER — ROCURONIUM BROMIDE 100 MG/10ML IV SOLN
INTRAVENOUS | Status: DC | PRN
  Administered 2012-10-11: 50 mg via INTRAVENOUS

## 2012-10-11 MED ORDER — METOPROLOL SUCCINATE 12.5 MG HALF TABLET
12.5000 mg | ORAL_TABLET | Freq: Two times a day (BID) | ORAL | Status: DC
  Administered 2012-10-11 – 2012-10-14 (×6): 12.5 mg via ORAL
  Filled 2012-10-11 (×7): qty 1

## 2012-10-11 MED ORDER — MENTHOL 3 MG MT LOZG: 1.0000 | LOZENGE | OROMUCOSAL | Status: DC | PRN

## 2012-10-11 MED ORDER — THROMBIN 5000 UNITS EX SOLR
CUTANEOUS | Status: DC | PRN
  Administered 2012-10-11 (×2): 5000 [IU] via TOPICAL

## 2012-10-11 MED ORDER — HYDROMORPHONE HCL PF 1 MG/ML IJ SOLN
INTRAMUSCULAR | Status: AC
  Filled 2012-10-11: qty 1

## 2012-10-11 MED ORDER — NEOSTIGMINE METHYLSULFATE 1 MG/ML IJ SOLN
INTRAMUSCULAR | Status: DC | PRN
  Administered 2012-10-11: 2 mg via INTRAVENOUS

## 2012-10-11 MED ORDER — VANCOMYCIN HCL IN DEXTROSE 1-5 GM/200ML-% IV SOLN
INTRAVENOUS | Status: AC
  Administered 2012-10-11: 1000 mg via INTRAVENOUS
  Filled 2012-10-11: qty 200

## 2012-10-11 MED ORDER — PROPOFOL 10 MG/ML IV BOLUS
INTRAVENOUS | Status: DC | PRN
  Administered 2012-10-11: 160 mg via INTRAVENOUS

## 2012-10-11 MED ORDER — BUPIVACAINE LIPOSOME 1.3 % IJ SUSP
INTRAMUSCULAR | Status: DC | PRN
  Administered 2012-10-11: 20 mL

## 2012-10-11 MED ORDER — LIDOCAINE HCL (CARDIAC) 20 MG/ML IV SOLN
INTRAVENOUS | Status: DC | PRN
  Administered 2012-10-11: 50 mg via INTRAVENOUS

## 2012-10-11 MED ORDER — PROMETHAZINE HCL 25 MG/ML IJ SOLN: 6.2500 mg | INTRAMUSCULAR | Status: DC | PRN

## 2012-10-11 MED ORDER — 0.9 % SODIUM CHLORIDE (POUR BTL) OPTIME
TOPICAL | Status: DC | PRN
  Administered 2012-10-11: 1000 mL

## 2012-10-11 MED ORDER — ISOSORBIDE MONONITRATE ER 60 MG PO TB24
120.0000 mg | ORAL_TABLET | Freq: Every day | ORAL | Status: DC
  Administered 2012-10-11 – 2012-10-14 (×4): 120 mg via ORAL
  Filled 2012-10-11 (×4): qty 2

## 2012-10-11 MED ORDER — MUPIROCIN 2 % EX OINT
TOPICAL_OINTMENT | CUTANEOUS | Status: AC
  Administered 2012-10-11: 1 via NASAL
  Filled 2012-10-11: qty 22

## 2012-10-11 MED ORDER — HYDROMORPHONE HCL PF 1 MG/ML IJ SOLN
0.2500 mg | INTRAMUSCULAR | Status: DC | PRN
  Administered 2012-10-11: 0.5 mg via INTRAVENOUS

## 2012-10-11 MED ORDER — LABETALOL HCL 5 MG/ML IV SOLN
INTRAVENOUS | Status: DC | PRN
  Administered 2012-10-11: 10 mg via INTRAVENOUS

## 2012-10-11 MED ORDER — LACTATED RINGERS IV SOLN
INTRAVENOUS | Status: DC | PRN
  Administered 2012-10-11 (×2): via INTRAVENOUS

## 2012-10-11 MED ORDER — HEMOSTATIC AGENTS (NO CHARGE) OPTIME
TOPICAL | Status: DC | PRN
  Administered 2012-10-11: 1 via TOPICAL

## 2012-10-11 MED ORDER — ONDANSETRON HCL 4 MG/2ML IJ SOLN
INTRAMUSCULAR | Status: DC | PRN
  Administered 2012-10-11: 4 mg via INTRAVENOUS

## 2012-10-11 MED ORDER — MIDAZOLAM HCL 2 MG/2ML IJ SOLN: 1.0000 mg | INTRAMUSCULAR | Status: DC | PRN

## 2012-10-11 MED ORDER — NITROGLYCERIN 0.4 MG SL SUBL
0.4000 mg | SUBLINGUAL_TABLET | SUBLINGUAL | Status: DC | PRN
  Filled 2012-10-11: qty 25

## 2012-10-11 MED ORDER — SODIUM CHLORIDE 0.9 % IJ SOLN
3.0000 mL | Freq: Two times a day (BID) | INTRAMUSCULAR | Status: DC
  Administered 2012-10-11: 3 mL via INTRAVENOUS

## 2012-10-11 MED ORDER — DIAZEPAM 5 MG PO TABS
5.0000 mg | ORAL_TABLET | Freq: Four times a day (QID) | ORAL | Status: DC | PRN
  Administered 2012-10-11 – 2012-10-12 (×3): 5 mg via ORAL
  Filled 2012-10-11 (×3): qty 1

## 2012-10-11 MED ORDER — DULOXETINE HCL 60 MG PO CPEP
60.0000 mg | ORAL_CAPSULE | Freq: Every day | ORAL | Status: DC
  Administered 2012-10-11 – 2012-10-13 (×3): 60 mg via ORAL
  Filled 2012-10-11 (×4): qty 1

## 2012-10-11 MED ORDER — ACETAMINOPHEN 650 MG RE SUPP: 650.0000 mg | RECTAL | Status: DC | PRN

## 2012-10-11 MED ORDER — POTASSIUM CHLORIDE CRYS ER 20 MEQ PO TBCR
20.0000 meq | EXTENDED_RELEASE_TABLET | Freq: Every day | ORAL | Status: DC
  Administered 2012-10-11 – 2012-10-14 (×4): 20 meq via ORAL
  Filled 2012-10-11 (×4): qty 1

## 2012-10-11 MED ORDER — ZOLPIDEM TARTRATE 5 MG PO TABS: 5.0000 mg | ORAL_TABLET | Freq: Every evening | ORAL | Status: DC | PRN

## 2012-10-11 MED ORDER — FENTANYL CITRATE 0.05 MG/ML IJ SOLN: 50.0000 ug | Freq: Once | INTRAMUSCULAR | Status: DC

## 2012-10-11 MED ORDER — BUPIVACAINE LIPOSOME 1.3 % IJ SUSP
20.0000 mL | INTRAMUSCULAR | Status: DC
  Filled 2012-10-11: qty 20

## 2012-10-11 MED ORDER — SODIUM CHLORIDE 0.9 % IV SOLN: 250.0000 mL | INTRAVENOUS | Status: DC

## 2012-10-11 SURGICAL SUPPLY — 61 items
APL SKNCLS STERI-STRIP NONHPOA (GAUZE/BANDAGES/DRESSINGS) ×1
BENZOIN TINCTURE PRP APPL 2/3 (GAUZE/BANDAGES/DRESSINGS) ×2 IMPLANT
BLADE SURG ROTATE 9660 (MISCELLANEOUS) IMPLANT
BUR ACORN 6.0 (BURR) IMPLANT
BUR MATCHSTICK NEURO 3.0 LAGG (BURR) ×2 IMPLANT
CANISTER SUCTION 2500CC (MISCELLANEOUS) ×2 IMPLANT
CLOTH BEACON ORANGE TIMEOUT ST (SAFETY) ×2 IMPLANT
CONT SPEC 4OZ CLIKSEAL STRL BL (MISCELLANEOUS) ×2 IMPLANT
DRAPE LAPAROTOMY 100X72X124 (DRAPES) ×2 IMPLANT
DRAPE MICROSCOPE LEICA (MISCELLANEOUS) ×2 IMPLANT
DRAPE POUCH INSTRU U-SHP 10X18 (DRAPES) ×2 IMPLANT
DRSG PAD ABDOMINAL 8X10 ST (GAUZE/BANDAGES/DRESSINGS) IMPLANT
DURAPREP 26ML APPLICATOR (WOUND CARE) ×2 IMPLANT
ELECT REM PT RETURN 9FT ADLT (ELECTROSURGICAL) ×2
ELECTRODE REM PT RTRN 9FT ADLT (ELECTROSURGICAL) ×1 IMPLANT
EVACUATOR 3/16  PVC DRAIN (DRAIN) ×1
EVACUATOR 3/16 PVC DRAIN (DRAIN) IMPLANT
GAUZE SPONGE 4X4 16PLY XRAY LF (GAUZE/BANDAGES/DRESSINGS) ×1 IMPLANT
GLOVE BIOGEL M 8.0 STRL (GLOVE) ×2 IMPLANT
GLOVE BIOGEL PI IND STRL 7.0 (GLOVE) IMPLANT
GLOVE BIOGEL PI INDICATOR 7.0 (GLOVE) ×3
GLOVE ECLIPSE 7.5 STRL STRAW (GLOVE) ×1 IMPLANT
GLOVE EXAM NITRILE LRG STRL (GLOVE) IMPLANT
GLOVE EXAM NITRILE MD LF STRL (GLOVE) ×1 IMPLANT
GLOVE EXAM NITRILE XL STR (GLOVE) IMPLANT
GLOVE EXAM NITRILE XS STR PU (GLOVE) IMPLANT
GLOVE SURG SS PI 7.0 STRL IVOR (GLOVE) ×3 IMPLANT
GOWN BRE IMP SLV AUR LG STRL (GOWN DISPOSABLE) ×2 IMPLANT
GOWN BRE IMP SLV AUR XL STRL (GOWN DISPOSABLE) ×3 IMPLANT
GOWN STRL REIN 2XL LVL4 (GOWN DISPOSABLE) IMPLANT
KIT BASIN OR (CUSTOM PROCEDURE TRAY) ×2 IMPLANT
KIT ROOM TURNOVER OR (KITS) ×2 IMPLANT
NDL HYPO 18GX1.5 BLUNT FILL (NEEDLE) IMPLANT
NDL HYPO 21X1.5 SAFETY (NEEDLE) IMPLANT
NDL HYPO 25X1 1.5 SAFETY (NEEDLE) IMPLANT
NDL SPNL 20GX3.5 QUINCKE YW (NEEDLE) IMPLANT
NEEDLE HYPO 18GX1.5 BLUNT FILL (NEEDLE) IMPLANT
NEEDLE HYPO 21X1.5 SAFETY (NEEDLE) ×2 IMPLANT
NEEDLE HYPO 25X1 1.5 SAFETY (NEEDLE) IMPLANT
NEEDLE SPNL 20GX3.5 QUINCKE YW (NEEDLE) IMPLANT
NS IRRIG 1000ML POUR BTL (IV SOLUTION) ×2 IMPLANT
PACK LAMINECTOMY NEURO (CUSTOM PROCEDURE TRAY) ×2 IMPLANT
PAD ARMBOARD 7.5X6 YLW CONV (MISCELLANEOUS) ×6 IMPLANT
PATTIES SURGICAL .5 X1 (DISPOSABLE) ×2 IMPLANT
RUBBERBAND STERILE (MISCELLANEOUS) ×4 IMPLANT
SPONGE GAUZE 4X4 12PLY (GAUZE/BANDAGES/DRESSINGS) ×2 IMPLANT
SPONGE LAP 4X18 X RAY DECT (DISPOSABLE) IMPLANT
SPONGE SURGIFOAM ABS GEL SZ50 (HEMOSTASIS) ×2 IMPLANT
STRIP CLOSURE SKIN 1/2X4 (GAUZE/BANDAGES/DRESSINGS) ×2 IMPLANT
SUT VIC AB 0 CT1 18XCR BRD8 (SUTURE) ×1 IMPLANT
SUT VIC AB 0 CT1 8-18 (SUTURE) ×4
SUT VIC AB 2-0 CP2 18 (SUTURE) ×3 IMPLANT
SUT VIC AB 3-0 SH 8-18 (SUTURE) ×3 IMPLANT
SYR 20CC LL (SYRINGE) ×1 IMPLANT
SYR 20ML ECCENTRIC (SYRINGE) ×2 IMPLANT
SYR 5ML LL (SYRINGE) IMPLANT
TAPE CLOTH SURG 4X10 WHT LF (GAUZE/BANDAGES/DRESSINGS) ×1 IMPLANT
TAPE STRIPS DRAPE STRL (GAUZE/BANDAGES/DRESSINGS) ×1 IMPLANT
TOWEL OR 17X24 6PK STRL BLUE (TOWEL DISPOSABLE) ×2 IMPLANT
TOWEL OR 17X26 10 PK STRL BLUE (TOWEL DISPOSABLE) ×2 IMPLANT
WATER STERILE IRR 1000ML POUR (IV SOLUTION) ×2 IMPLANT

## 2012-10-11 NOTE — Progress Notes (Signed)
Patient ID: [name], female   DOB: Dec 10, 1932, 79 y.o.   MRN: [id number] Stable. Moves both legs. Talked with daughter

## 2012-10-11 NOTE — H&P (Signed)
[name] is an 79 y.o. female.   Chief Complaint: lumbar pain HPI: patient who came to my office complaining of lumbar pain with radiation to both lower extremities with burning sensation and weakness associated with ambulation. [name] has had conservative treatment with no improvement  Past Medical History  Diagnosis Date  . PVD (peripheral vascular disease)   . Other and unspecified hyperlipidemia   . Coronary atherosclerosis of native coronary artery     nonobstructive CAD by multiple catheterizations  . Panic disorder without agoraphobia   . HTN (hypertension)     Hx of it  . MI (myocardial infarction) 2006  . Internal hemorrhoids without mention of complication   . Dysphagia, unspecified(787.20)   . Microscopic colitis 2003  . GERD (gastroesophageal reflux disease)     Hx Schatzki's ring, multiple EGD/ED last 01/06/2004  . Hyperlipemia   . Thyroid disease     recent abnl TSH per pt  . S/P colonoscopy 09/27/2001    internal hemorrhoids, desc colon inflam polyp, SB BX-chronic duodenitis, colitis  . Arthritis   . Shortness of breath   . Heart disease   . Bursitis     left shoulder  . Hyperlipidemia   . Sleep disorder     obstructive  . COPD (chronic obstructive pulmonary disease)   . Complication of anesthesia   . PONV (postoperative nausea and vomiting)     'a little nausea"  . Anxiety   . Depression   . Pneumonia 12/2011  . Heart murmur     'a littel'  . Cataract     Past Surgical History  Procedure Laterality Date  . Tonsillectomy    . Unspecified area, hysterectomy  1972    partial  . Cholecystectomy  1998  . Right knee replacement  2007  . Right leg benign tumor    . Breast lumpectomy  1998    left, benign  . Left hand surgery    . Left rotator cuff surgery    . Nasal sinus surgery    . Bladder tack  06/2010  . Cardiac catheterization    . Maloney dilation  03/16/2011    Gastritis. No H.pylori on bx. 77F maloney dilation with disruption of  occult  cevical esophageal web  . Colonoscopy  03/16/2011    multiple hyperplastic colon polyps, sigmoid diverticulosis, melanosis coli  . Bladder suspension  11/09/2011    Procedure: TRANSVAGINAL TAPE (TVT) PROCEDURE;  Surgeon: Marissa Nestle, MD;  Location: [facility];  Service: Urology;  Laterality: N/A;  . Abdominal hysterectomy    . Appendectomy    . Carpal tunnel release  1989    left  . Anterior and posterior repair      with resection of vagina  . Cardiac catheterization    . Shoulder surgery Left   . Joint replacement Right 2007    Family History  Problem Relation Age of Onset  . Coronary artery disease Other     family Hx-sons  . Cancer Other   . Stroke Other     family Hx  . Hypertension Other     family Hx  . Diabetes Brother    Social History:  reports that she quit smoking about 10 years ago. Her smoking use included Cigarettes. She has a 64 pack-year smoking history. She has never used smokeless tobacco. She reports that she does not drink alcohol or use illicit drugs.  Allergies:  Allergies  Allergen Reactions  . Phenothiazines Anaphylaxis and Hives  .  Polysorbate Anaphylaxis  . Prednisone Shortness Of Breath  . Statins Other (See Comments)    vomiting  . Codeine Itching  . Atorvastatin Hives  . Penicillins     unknown  . Pimozide Hives and Itching  . Prochlorperazine Other (See Comments)    unknown  . Ofloxacin Rash  . Other Itching and Rash    "WOOL"= make skin look like it has been burned    Medications Prior to Admission  Medication Sig Dispense Refill  . ALPRAZolam (XANAX) 0.25 MG tablet Take 0.25 mg by mouth 3 (three) times daily as needed. for anxiety      . aspirin 81 MG EC tablet Take 81 mg by mouth daily.        . Biotin 5 MG TABS Take 5 mg by mouth daily.      . Calcium Carbonate-Vit D-Min (CALCIUM 600+D PLUS MINERALS) 600-400 MG-UNIT TABS Take 1 tablet by mouth daily.       . Cranberry-Vitamin C-Vitamin E (CRANBERRY PLUS VITAMIN C) 140-100-3  MG-MG-UNIT CAPS Take 1 capsule by mouth daily.       . DULoxetine (CYMBALTA) 60 MG capsule Take 60 mg by mouth at bedtime.       . furosemide (LASIX) 20 MG tablet Take 20 mg by mouth daily as needed for fluid.       . isosorbide mononitrate (IMDUR) 120 MG 24 hr tablet Take 120 mg by mouth daily.        . metoprolol succinate (TOPROL-XL) 25 MG 24 hr tablet Take 12.5 mg by mouth 2 (two) times daily.      . nitroGLYCERIN (NITROSTAT) 0.4 MG SL tablet Place 0.4 mg under the tongue every 5 (five) minutes as needed. For chest pain      . Omega-3 Fatty Acids (FISH OIL) 1000 MG CAPS Take 1 capsule by mouth daily.        . pantoprazole (PROTONIX) 40 MG tablet Take 40 mg by mouth daily.        . polyethylene glycol (MIRALAX / GLYCOLAX) packet Take 17 g by mouth daily as needed. For constipation      . potassium chloride SA (K-DUR,KLOR-CON) 20 MEQ tablet Take 20 mEq by mouth daily.      . traMADol (ULTRAM) 50 MG tablet Take 50 mg by mouth 2 (two) times daily as needed. For pain      . vitamin E 400 UNIT capsule Take 400 Units by mouth daily.        No results found for this or any previous visit (from the past 48 hour(s)). No results found.  Review of Systems  Constitutional: Negative.   HENT: Negative.   Eyes: Negative.   Respiratory: Negative.   Cardiovascular:       Arterial hypertension  Genitourinary: Positive for dysuria.  Musculoskeletal: Positive for back pain.  Skin: Negative.   Neurological: Positive for sensory change and focal weakness.  Psychiatric/Behavioral: Positive for depression.    Blood pressure 166/92, pulse 67, temperature 97.8 F (36.6 C), temperature source Oral, resp. rate 18, SpO2 98.00%. Physical Exam hent, nl. Neck, nl. Cv, nl. Lungs, some wheezing. Abdomen, soft. Extremities, pain in left shoulder. NEURO  WEAKNESS OF df BOTH FEET. slr POSITIVE AT 60 DEGREES. FEMORAL STRETCH MANEUVER IS POSITIVE BILATERALLY.  MRI SHOWS FACET HYPERTROPHY AND STENOSIS AT L-34 AND 4-5.    Assessment/Plan PATIENT WITH STENOSIS LUMBAR AND NEROGENIC CLAUDICATION. SHE IS have l3-4-5 laminectomies, foraminotomies. She is aware of risks and benefits of the surgery  [name] M 10/11/2012, 7:57 AM

## 2012-10-11 NOTE — Transfer of Care (Signed)
Immediate Anesthesia Transfer of Care Note  Patient: [name]  Procedure(s) Performed: Procedure(s) with comments: LUMBAR LAMINECTOMY/DECOMPRESSION MICRODISCECTOMY 2 LEVELS (N/A) - L3-4 L4-5 Laminectomy  Patient Location: PACU  Anesthesia Type:General  Level of Consciousness: awake, alert  and oriented  Airway & Oxygen Therapy: Patient Spontanous Breathing and Patient connected to nasal cannula oxygen  Post-op Assessment: Report given to PACU RN  Post vital signs: Reviewed  Complications: No apparent anesthesia complications

## 2012-10-11 NOTE — Anesthesia Preprocedure Evaluation (Signed)
Anesthesia Evaluation  Patient identified by MRN, date of birth, ID band Patient awake    Reviewed: Allergy & Precautions, H&P , NPO status , Patient's Chart, lab work & pertinent test results  History of Anesthesia Complications (+) PONV  Airway Mallampati: II TM Distance: <3 FB Neck ROM: Limited    Dental   Pulmonary shortness of breath, former smoker,  + rhonchi         Cardiovascular hypertension, + CAD, + Past MI and + Peripheral Vascular Disease Rhythm:Regular Rate:Normal     Neuro/Psych Anxiety Depression Panic disorder   GI/Hepatic GERD-  ,  Endo/Other    Renal/GU      Musculoskeletal   Abdominal (+) + obese,   Peds  Hematology   Anesthesia Other Findings   Reproductive/Obstetrics                           Anesthesia Physical Anesthesia Plan  ASA: III  Anesthesia Plan: General   Post-op Pain Management:    Induction: Intravenous  Airway Management Planned: Oral ETT  Additional Equipment:   Intra-op Plan:   Post-operative Plan: Extubation in OR  Informed Consent: I have reviewed the patients History and Physical, chart, labs and discussed the procedure including the risks, benefits and alternatives for the proposed anesthesia with the patient or authorized representative who has indicated his/her understanding and acceptance.     Plan Discussed with: CRNA and Surgeon  Anesthesia Plan Comments:         Anesthesia Quick Evaluation

## 2012-10-11 NOTE — Progress Notes (Signed)
ANTIBIOTIC CONSULT NOTE - INITIAL  Pharmacy Consult for Vancomycin Indication: surgical prophylaxis  Patient Measurements: Weight 90.7 kg on 09/16/12 Height 155 cm on 09/16/12 Vital Signs: Temp: 96.8 F (36 C) (06/27 1419) Temp src: Oral (06/27 0730) BP: 109/50 mmHg (06/27 1405) Pulse Rate: 67 (06/27 1419) Labs:  Recent Labs  10/11/12 0741  WBC 7.4  HGB 14.6  PLT 256  CREATININE 1.03   Microbiology: Recent Results (from the past 720 hour(s))  SURGICAL PCR SCREEN     Status: Abnormal   Collection Time    10/11/12  7:41 AM      Result Value Range Status   MRSA, PCR NEGATIVE  NEGATIVE Final   Staphylococcus aureus POSITIVE (*) NEGATIVE Final   Comment:            The Xpert SA Assay (FDA     approved for NASAL specimens     in patients over 68 years of age),     is one component of     a comprehensive surveillance     program.  Test performance has     been validated by [employer] for patients greater     than or equal to 38 year old.     It is not intended     to diagnose infection nor to     guide or monitor treatment.   Assessment: 79 yo status post L3-4-5 laminectomies to received vancomycin post-op for surgical prophylaxis. SCr 1.03/estCrCl~65 mL/min. WBC wnl. Afebrile. Vancomycin 1gm given pre-operatively.   Goal of Therapy:  Vancomycin trough level 15-20 mcg/ml  Plan:  1. Vancomycin 1g IV q12h.  2. Monitor renal function and cultures if ordered. 3. Vancomycin trough at Css if continued greater than 5 days.  4. Follow-up MD's plan for duration of therapy  Brain Hilts 10/11/2012,2:47 PM

## 2012-10-11 NOTE — Preoperative (Signed)
Beta Blockers   Reason not to administer Beta Blockers:Not Applicable 

## 2012-10-11 NOTE — Progress Notes (Signed)
Op nte [phone]

## 2012-10-11 NOTE — Anesthesia Postprocedure Evaluation (Signed)
  Anesthesia Post-op Note  Patient: [name]  Procedure(s) Performed: Procedure(s) with comments: LUMBAR LAMINECTOMY/DECOMPRESSION MICRODISCECTOMY 2 LEVELS (N/A) - L3-4 L4-5 Laminectomy  Patient Location: PACU  Anesthesia Type:General  Level of Consciousness: awake  Airway and Oxygen Therapy: Patient Spontanous Breathing  Post-op Pain: mild  Post-op Assessment: Post-op Vital signs reviewed, Patient's Cardiovascular Status Stable, Respiratory Function Stable, Patent Airway, No signs of Nausea or vomiting and Pain level controlled  Post-op Vital Signs: stable  Complications: No apparent anesthesia complications

## 2012-10-12 MED ORDER — POLYETHYLENE GLYCOL 3350 17 G PO PACK
17.0000 g | PACK | Freq: Every day | ORAL | Status: DC
  Administered 2012-10-12 – 2012-10-14 (×3): 17 g via ORAL
  Filled 2012-10-12 (×3): qty 1

## 2012-10-12 MED ORDER — TRAMADOL HCL 50 MG PO TABS
50.0000 mg | ORAL_TABLET | Freq: Four times a day (QID) | ORAL | Status: DC | PRN
  Administered 2012-10-12 – 2012-10-13 (×3): 50 mg via ORAL
  Filled 2012-10-12 (×3): qty 1

## 2012-10-12 MED ORDER — WHITE PETROLATUM GEL
Status: AC
  Administered 2012-10-12: 0.2
  Filled 2012-10-12: qty 5

## 2012-10-12 MED ORDER — DIPHENHYDRAMINE HCL 25 MG PO CAPS: 25.0000 mg | ORAL_CAPSULE | Freq: Four times a day (QID) | ORAL | Status: DC | PRN

## 2012-10-12 NOTE — Evaluation (Signed)
Physical Therapy Evaluation Patient Details Name: [name] MRN: [id number] DOB: 1932/05/14 Today's Date: 10/12/2012 Time: [id number] PT Time Calculation (min): 11 min  PT Assessment / Plan / Recommendation History of Present Illness  79 y/o female s/p bilateral L3, L4, L5 laminectomy, foraminotomy.  Clinical Impression  Presents to PT with below listed impairments impacting functional independence and safety. Limited ambulation today because of pain, RN made aware and working on pain meds. Moving rather well however limited by pain. Encouraged more mobility today with nsg staff as pain more controlled. Will benefit physical therapy in the acute setting to maximize functional independence and safety for d/c home.     PT Assessment  Patient needs continued PT services    Follow Up Recommendations  Home health PT;Supervision for mobility/OOB    Does the patient have the potential to tolerate intense rehabilitation      Barriers to Discharge   lives with daughter and son, daughter works and son is disabled but she reports he can still physically assist her    Equipment Recommendations  None recommended by PT    Recommendations for Other Services     Frequency Min 5X/week    Precautions / Restrictions Precautions Precautions: Fall;Back Precaution Comments: reviewed 3/3 back precautions Restrictions Weight Bearing Restrictions: No   Pertinent Vitals/Pain Reports 10/10 back pain, RN made aware      Mobility  Bed Mobility Bed Mobility: Sit to Sidelying Right;Rolling Left Rolling Left: 4: Min assist Sit to Sidelying Right: 4: Min guard;HOB flat Details for Bed Mobility Assistance: min tactile cues to reinforce back precautions Transfers Transfers: Sit to Stand;Stand to Sit Sit to Stand: 4: Min guard;With upper extremity assist Stand to Sit: 4: Min guard;With upper extremity assist Details for Transfer Assistance: a little impulsive because of pain, gaurding for  safety Ambulation/Gait Ambulation/Gait Assistance: 4: Min assist Ambulation Distance (Feet): 12 Feet Assistive device: 1 person hand held assist Ambulation/Gait Assistance Details: ambulated around the bed but needed to sit because of significant back pain, hand held assist for stability; increased DOE with ambulation 2-3/4, SaO2 99% following ambulation at rest Gait Pattern: Step-through pattern;Wide base of support;Decreased stride length Stairs: No    Exercises  Patient independently performing ankle pumps following our session   PT Diagnosis: Difficulty walking;Generalized weakness;Acute pain  PT Problem List: Decreased activity tolerance;Pain;Decreased mobility;Decreased knowledge of precautions;Cardiopulmonary status limiting activity PT Treatment Interventions: DME instruction;Gait training;Therapeutic activities;Therapeutic exercise;Neuromuscular re-education;Patient/family education;Functional mobility training     PT Goals(Current goals can be found in the care plan section) Acute Rehab PT Goals Patient Stated Goal: To ambulate and be independent PT Goal Formulation: With patient Time For Goal Achievement: 10/19/12 Potential to Achieve Goals: Good  Visit Information  Last PT Received On: 10/12/12 Assistance Needed: +1 History of Present Illness: 79 y/o female s/p bilateral L3, L4, L5 laminectomy, foraminotomy.       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[employer]: No difficulties    Cognition  Cognition Arousal/Alertness:  Awake/alert Behavior During Therapy: WFL for tasks assessed/performed Overall Cognitive Status: Within Functional Limits for tasks assessed    Extremity/Trunk Assessment Upper Extremity Assessment Upper Extremity Assessment: Overall  WFL for tasks assessed Lower Extremity Assessment Lower Extremity Assessment: Generalized weakness Cervical / Trunk Assessment Cervical / Trunk Assessment: Kyphotic   Balance    End of Session PT - End of Session Equipment Utilized During Treatment: Gait belt Activity Tolerance: Patient limited by pain Patient left: in bed;with call bell/phone within reach;with bed alarm set Nurse Communication: Mobility status  GP     [place] 10/12/2012, 8:52 AM

## 2012-10-12 NOTE — Progress Notes (Signed)
Doing well. C/o appropriate incisional soreness. Less leg  pain Starting to ambulate  Temp:  [96.8 F (36 C)-98.1 F (36.7 C)] 97.8 F (36.6 C) (06/28 0554) Pulse Rate:  [58-78] 78 (06/28 0554) Resp:  [11-21] 18 (06/28 0554) BP: (109-140)/(40-71) 140/66 mmHg (06/28 0554) SpO2:  [91 %-98 %] 98 % (06/28 0554) Weight:  [94.7 kg (208 lb 12.4 oz)] 94.7 kg (208 lb 12.4 oz) (06/27 1528) Good strength and sensation Incision CDI  Plan: CPM

## 2012-10-12 NOTE — Progress Notes (Signed)
Pt complained of inability to urinate and stated "I haven't used the bathroom since 0645, before my procedure." Bladder scan >500. Patient in & out with 600 cc of clear yellow urine returned. Will continue to monitor.

## 2012-10-12 NOTE — Op Note (Signed)
NAMEALORA, LOCKLEAR NO.:  [id number]  MEDICAL RECORD NO.:  [id number]  LOCATION:  [id number]                        FACILITY:  [facility]  PHYSICIAN:  Leeroy Cha, M.D.   DATE OF BIRTH:  12-31-1932  DATE OF PROCEDURE:  10/11/2012 DATE OF DISCHARGE:                              OPERATIVE REPORT   PREOPERATIVE DIAGNOSIS:  Lumbar stenosis with neurogenic claudication.  POSTOPERATIVE DIAGNOSIS:  Lumbar stenosis with neurogenic claudication.  PROCEDURE:  Bilateral L3, L4, L5 laminectomy, foraminotomy.  Microscope.  SURGEON:  Leeroy Cha, MD.  ASSISTANT:  Dr. Darrick Meigs.  CLINICAL HISTORY:  This patient is a lady who came to my office complaining of back pain, radiating to both legs.  The patient has had conservative treatment including epidural injection, physical therapy. The pain goes to both legs, it is mostly associated with walking.  MRI shows stenosis at L3-L4, L4-L5 being the worse.  I disagree with the way the MRI was read.  Nevertheless, I talked to this patient and in view that she has failed with every single conservative treatment.  We talked about surgery.  She decided that because of the pain that was the best option.  She knew the risk with the surgery as well as the benefit.  PROCEDURE NOTE:  The patient was taken to the OR, and she was positioned in a prone manner after intubation.  The back was cleaned first with Betadine and later on with DuraPrep.  Drapes were applied.  Midline incision from L3 down to L4, L5 was made and muscle retracted laterally. We identified the L3-L4 space all the way down to L5-S1.  With the Le[place], we removed the spinous process of L5, L4, L3.  Using the drill as well as 2 and 3 Kerrison punch with laminectomy with decompression of the thick calcified ligament bilaterally from L3-L4 down to L5-S1.  Then using the microscope, we drilled the lamina all the way down to the junction with the facet.  With the Kerrison punch  2 and 3 mm, we did decompression of the foramen of L3, L4, L5, and S1 nerve root.  The area was decompressed, Valsalva maneuver was negative.  A drain was left in the epidural space and the wound was closed with Vicryl and Steri- Strips.          ______________________________ Leeroy Cha, M.D.     EB/MEDQ  D:  10/11/2012  T:  10/12/2012  Job:  [id number]

## 2012-10-12 NOTE — Progress Notes (Signed)
Patient had an episode of chest pain while on the bedside commode. Patient was helped back to bed, oxygen applied, VS taken and stable. Nitro was pulled from pyxis per patient's PRN orders. Pt stated CP had eased off and she did not want nitro and that it was from stress and it happens all the time at home and she just needed to "calm down." Will continue to closely monitor.

## 2012-10-12 NOTE — Evaluation (Signed)
Occupational Therapy Evaluation Patient Details Name: [name] MRN: [id number] DOB: [birth year] Today's Date: 10/12/2012 Time: 1440-1500 OT Time Calculation (min): 20 min  OT Assessment / Plan / Recommendation History of present illness     Clinical Impression   Pt s/p bil L3. L4, L5 lami, foraminotomy. Pt will benefit from continued acute OT services to address below problem list.     OT Assessment  Patient needs continued OT Services    Follow Up Recommendations  No OT follow up;Supervision/Assistance - 24 hour    Barriers to Discharge      Equipment Recommendations  None recommended by OT    Recommendations for Other Services    Frequency  Min 2X/week    Precautions / Restrictions Precautions Precautions: Fall;Back Precaution Comments: reviewed 3/3 back precautions   Pertinent Vitals/Pain See vitals    ADL  Grooming: Performed;Wash/dry hands;Supervision/safety Where Assessed - Grooming: Unsupported standing Lower Body Dressing: Simulated;Minimal assistance Where Assessed - Lower Body Dressing: Unsupported sit to stand Toilet Transfer: Performed;Min guard Toilet Transfer Method: Sit to Loss adjuster, chartered: Comfort height toilet Toileting - Clothing Manipulation and Hygiene: Performed;Supervision/safety Where Assessed - Best boy and Hygiene: Standing Equipment Used: Gait belt Transfers/Ambulation Related to ADLs: min guard for safety    OT Diagnosis: Generalized weakness;Acute pain  OT Problem List: Decreased strength;Decreased activity tolerance;Decreased knowledge of precautions;Pain OT Treatment Interventions: Self-care/ADL training;Therapeutic activities;Patient/family education;DME and/or AE instruction   OT Goals(Current goals can be found in the care plan section) Acute Rehab OT Goals OT Goal Formulation: With patient Time For Goal Achievement: 10/19/12 Potential to Achieve Goals: Good  Visit Information  Last OT  Received On: 10/12/12 Assistance Needed: +1       Prior [place] expects to be discharged to:: Private residence Living Arrangements: Children Available Help at Discharge: Family;Available 24 hours/day Type of Home: House Home Access: Ramped entrance Home Layout: One level Home Equipment: Grab bars - tub/shower;Bedside commode;Shower seat;Walker - 2 wheels;Cane - single point Prior Function Level of Independence: Independent 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         Vision/Perception     Cognition  Cognition Arousal/Alertness: Awake/alert Behavior During Therapy: WFL for tasks assessed/performed Overall Cognitive Status: Within Functional Limits for tasks assessed    Extremity/Trunk Assessment Upper Extremity Assessment Upper Extremity Assessment: Overall WFL for tasks assessed     Mobility Bed Mobility Bed Mobility: Rolling Left;Sit to Sidelying Left;Left Sidelying to Sit Rolling Left: 5: Supervision Left Sidelying to Sit: 5: Supervision Sit to Sidelying Left: 5: Supervision Transfers Transfers: Sit to Stand;Stand to Sit Sit to Stand: 4: Min guard;From bed;From toilet;With upper extremity assist Stand to Sit: 4: Min guard;To bed;To toilet;With upper extremity assist     Exercise     Balance     End of Session OT - End of Session Equipment Utilized During Treatment: Gait belt Activity Tolerance: Patient tolerated treatment well Patient left: in bed;with call bell/phone within reach;with nursing/sitter in room;with family/visitor present Nurse Communication: Mobility status  GO    10/12/2012 Darrol Jump OTR/L Pager [phone] Office [phone]  Darrol Jump 10/12/2012, 3:40 PM

## 2012-10-13 NOTE — Progress Notes (Signed)
Occupational Therapy Treatment Patient Details Name: QUIANNA CRANKSHAW MRN: [id number] DOB: 1933-04-03 Today's Date: 10/13/2012 Time: [id number] OT Time Calculation (min): 16 min  OT Assessment / Plan / Recommendation  OT comments  Pt has met all goals and has no further acute OT needs.  Follow Up Recommendations  No OT follow up;Supervision/Assistance - 24 hour    Barriers to Discharge       Equipment Recommendations  None recommended by OT    Recommendations for Other Services    Frequency Min 2X/week   Progress towards OT Goals Progress towards OT goals: Goals met/education completed, patient discharged from PT  Plan Discharge plan remains appropriate;All goals met and education completed, patient discharged from OT services    Precautions / Restrictions Precautions Precautions: Fall;Back Precaution Comments: pt maintained 3/3 back precautions throughout session.   Pertinent Vitals/Pain See vitals    ADL  Lower Body Dressing: Performed;Modified independent Where Assessed - Lower Body Dressing: Unsupported sit to stand Toilet Transfer: Simulated;Supervision/safety Toilet Transfer Method:  (ambulating) Science writer:  (bed) Tub/Shower Transfer: Armed forces operational officer Method: Therapist, art: Civil engineer, contracting with Adult nurse Used: Rolling walker Transfers/Ambulation Related to ADLs: supervision with RW ADL Comments: Pt demo'd supervision with tub tranfser with both shower chair/tub bench  (pt realized she had tub bench after performing tub transfer with shower chair).  Pt demo'd independence with AE for LB dressing. Daughter reports she will be home with pt 24/7 for next 2 weeks.    OT Diagnosis:    OT Problem List:   OT Treatment Interventions:     OT Goals(current goals can now be found in the care plan section) ADL Goals Pt Will Perform Lower Body Dressing: with modified independence;sit  to/from stand Pt Will Transfer to Toilet: ambulating;with supervision;bedside commode;regular height toilet Pt Will Perform Tub/Shower Transfer: Tub transfer;with supervision;ambulating;shower seat  Visit Information  Last OT Received On: 10/13/12 Assistance Needed: +1    Subjective Data      Prior Functioning       Cognition  Cognition Arousal/Alertness: Awake/alert Behavior During Therapy: WFL for tasks assessed/performed Overall Cognitive Status: Within Functional Limits for tasks assessed    Mobility  Bed Mobility Bed Mobility: Rolling Left;Left Sidelying to Sit;Sitting - Scoot to [employer] of Bed;Sit to Sidelying Left Rolling Left: 6: Modified independent (Device/Increase time) Left Sidelying to Sit: 6: Modified independent (Device/Increase time) Sitting - Scoot to Edge of Bed: 7: Independent Sit to Sidelying Right: 6: Modified independent (Device/Increase time) Transfers Transfers: Sit to Stand;Stand to Sit Sit to Stand: 5: Supervision;From bed Stand to Sit: 5: Supervision;To bed Details for Transfer Assistance: supervision for safety    Exercises      Balance     End of Session OT - End of Session Equipment Utilized During Treatment: Rolling walker Activity Tolerance: Patient tolerated treatment well Patient left: in bed;with call bell/phone within reach;with family/visitor present Nurse Communication: Mobility status  GO    10/13/2012 Darrol Jump OTR/L Pager [phone] Office [phone]  Darrol Jump 10/13/2012, 2:00 PM

## 2012-10-13 NOTE — Progress Notes (Signed)
Physical Therapy Treatment Patient Details Name: [name] MRN: [id number] DOB: 04/22/1932 Today's Date: 10/13/2012 Time: [id number] PT Time Calculation (min): 13 min  PT Assessment / Plan / Recommendation  PT Comments   Pt making progress with PT goals & mobility at this date.  Pt used RW for stability while ambulating & demonstrated safe use of RW.  Pt on track for a safe d/c home when MD feels medically ready.  Encouraged pt to ambulate with [place] 2-3x's/day.      Follow Up Recommendations  Home health PT;Supervision for mobility/OOB     Does the patient have the potential to tolerate intense rehabilitation     Barriers to Discharge        Equipment Recommendations  None recommended by PT    Recommendations for Other Services    Frequency Min 5X/week   Progress towards PT Goals Progress towards PT goals: Progressing toward goals  Plan Current plan remains appropriate    Precautions / Restrictions Precautions Precautions: Fall;Back Precaution Comments: Pt able to recall 2/3 back precautions.  Cues to recall "no arching" Restrictions Weight Bearing Restrictions: No       Mobility  Bed Mobility Bed Mobility: Not assessed Details for Bed Mobility Assistance: pt sitting on EOB upon arrival Transfers Transfers: Sit to Stand;Stand to Sit Sit to Stand: 5: Supervision;With upper extremity assist;From bed Stand to Sit: 5: Supervision;With upper extremity assist;With armrests;To chair/3-in-1 Details for Transfer Assistance: cues for safe hand placement Ambulation/Gait Ambulation/Gait Assistance: 4: Min guard Ambulation Distance (Feet): 150 Feet Assistive device: Rolling walker Ambulation/Gait Assistance Details: Pt utilized RW for stability during ambulation today.  Demonstrated proper use of RW,  & good posture.   Pt states it feels good to ambulate.  No c/o back pain, only states she is having "gas" pain.   Gait Pattern: Step-through pattern;Decreased stride length Gait  velocity: decreased General Gait Details: 02 sats remained 95% RA or higher with ambulation Stairs: No Wheelchair Mobility Wheelchair Mobility: No      PT Goals (current goals can now be found in the care plan section)    Visit Information  Last PT Received On: 10/13/12 Assistance Needed: +1    Subjective Data      Cognition  Cognition Arousal/Alertness: Awake/alert Behavior During Therapy: WFL for tasks assessed/performed Overall Cognitive Status: Within Functional Limits for tasks assessed    Balance     End of Session PT - End of Session Activity Tolerance: Patient tolerated treatment well Patient left: in chair;with call bell/phone within reach Nurse Communication: Mobility status   GP     Sena Hitch 10/13/2012, 8:19 AM   Sarajane Marek, PTA [phone] 10/13/2012

## 2012-10-13 NOTE — Progress Notes (Signed)
Patient ID: [name], female   DOB: Jun 14, 1932, 79 y.o.   MRN: [id number] Afeb. vss Feeling better. Wound fine. Will d/c drain today. Plans to go home tomorrow.

## 2012-10-13 NOTE — Progress Notes (Signed)
Clinical Social Work  CSW received inappropriate referral for SNF placement. PT/OT recommends HH at [place]. CSW is signing off but available if further needs arise.  Sindy Messing, LCSW (Weekend Coverage)

## 2012-10-14 NOTE — Discharge Summary (Signed)
Physician Discharge Summary  Patient ID: GAVINA LATON MRN: [id number] DOB/AGE: 77-Jul-1934 77 y.o.  Admit date: 10/11/2012 Discharge date: 10/14/2012  Admission Diagnoses:lumbar stenosis  Discharge Diagnoses: same Active Problems:   * No active hospital problems. *   Discharged Condition: no pain, no weakness Hospital Course: surgery  Consults: none  Significant Diagnostic Studies:mri  Treatments:lumbar laminectomies  Discharge Exam: Blood pressure 138/50, pulse 74, temperature 97.8 F (36.6 C), temperature source Oral, resp. rate 20, height 5\' 1"  (1.549 m), weight 94.7 kg (208 lb 12.4 oz), SpO2 99.00%. Ambulating. No weakness  Disposition: home   Future Appointments Provider Department Dept Phone   10/30/2012 2:00 PM Herminio Commons, MD [place] (near [place]) [phone]       Medication List    ASK your doctor about these medications       ALPRAZolam 0.25 MG tablet  Commonly known as:  XANAX  Take 0.25 mg by mouth 3 (three) times daily as needed. for anxiety     aspirin 81 MG EC tablet  Take 81 mg by mouth daily.     Biotin 5 MG Tabs  Take 5 mg by mouth daily.     CALCIUM 600+D PLUS MINERALS 600-400 MG-UNIT Tabs  Take 1 tablet by mouth daily.     CRANBERRY PLUS VITAMIN C 140-100-3 MG-MG-UNIT Caps  Generic drug:  Cranberry-Vitamin C-Vitamin E  Take 1 capsule by mouth daily.     DULoxetine 60 MG capsule  Commonly known as:  CYMBALTA  Take 60 mg by mouth at bedtime.     Fish Oil 1000 MG Caps  Take 1 capsule by mouth daily.     furosemide 20 MG tablet  Commonly known as:  LASIX  Take 20 mg by mouth daily as needed for fluid.     isosorbide mononitrate 120 MG 24 hr tablet  Commonly known as:  IMDUR  Take 120 mg by mouth daily.     metoprolol succinate 25 MG 24 hr tablet  Commonly known as:  TOPROL-XL  Take 12.5 mg by mouth 2 (two) times daily.     nitroGLYCERIN 0.4 MG SL tablet  Commonly known as:  NITROSTAT  Place 0.4 mg  under the tongue every 5 (five) minutes as needed. For chest pain     pantoprazole 40 MG tablet  Commonly known as:  PROTONIX  Take 40 mg by mouth daily.     polyethylene glycol packet  Commonly known as:  MIRALAX / GLYCOLAX  Take 17 g by mouth daily as needed. For constipation     potassium chloride SA 20 MEQ tablet  Commonly known as:  K-DUR,KLOR-CON  Take 20 mEq by mouth daily.     traMADol 50 MG tablet  Commonly known as:  ULTRAM  Take 50 mg by mouth 2 (two) times daily as needed. For pain     vitamin E 400 UNIT capsule  Take 400 Units by mouth daily.         Signed: Floyce Stakes 10/14/2012, 9:41 AM

## 2012-10-14 NOTE — Progress Notes (Signed)
Physical Therapy Treatment Patient Details Name: [name] MRN: [id number] DOB: 1932/07/31 Today's Date: 10/14/2012 Time: [id number] PT Time Calculation (min): 26 min  PT Assessment / Plan / Recommendation  PT Comments   Pt moving very well with ambulation and transfers today. Anticipate pt can attempt without RW next session. Dgtr and pt educated for all precautions with handout.  Follow Up Recommendations  No PT follow up;Supervision - Intermittent     Does the patient have the potential to tolerate intense rehabilitation     Barriers to Discharge        Equipment Recommendations  None recommended by PT    Recommendations for Other Services    Frequency Min 5X/week   Progress towards PT Goals Progress towards PT goals: Goals updated - see care plan  Plan Current plan remains appropriate;Discharge plan needs to be updated    Precautions / Restrictions Precautions Precautions: Fall;Back Precaution Booklet Issued: Yes (comment) Precaution Comments: pt maintained 3/3 back precautions throughout session. Restrictions Weight Bearing Restrictions: No   Pertinent Vitals/Pain 2/10 back pain    Mobility  Bed Mobility Bed Mobility: Sit to Sidelying Left Rolling Left: 6: Modified independent (Device/Increase time) Left Sidelying to Sit: 6: Modified independent (Device/Increase time) Sitting - Scoot to Edge of Bed: 7: Independent Sit to Sidelying Left: 5: Supervision Details for Bed Mobility Assistance: cueing for upper arm placement to prevent twisting Transfers Sit to Stand: 6: Modified independent (Device/Increase time);From chair/3-in-1;From bed Stand to Sit: 6: Modified independent (Device/Increase time);To bed;To chair/3-in-1 Ambulation/Gait Ambulation/Gait Assistance: 6: Modified independent (Device/Increase time) Ambulation Distance (Feet): 800 Feet Assistive device: Rolling walker Ambulation/Gait Assistance Details: pt maintained correct position in RW and good gait  speed throughout should be able to attempt without RW next session Gait Pattern: Within Functional Limits Gait velocity: decreased    Exercises     PT Diagnosis:    PT Problem List:   PT Treatment Interventions:     PT Goals (current goals can now be found in the care plan section)    Visit Information  Last PT Received On: 10/14/12 Assistance Needed: +1 History of Present Illness: 79 y/o female s/p bilateral L3, L4, L5 laminectomy, foraminotomy.    Subjective Data      Cognition  Cognition Arousal/Alertness: Awake/alert Behavior During Therapy: WFL for tasks assessed/performed Overall Cognitive Status: Within Functional Limits for tasks assessed    Balance     End of Session PT - End of Session Equipment Utilized During Treatment: Gait belt Activity Tolerance: Patient tolerated treatment well Patient left: in chair;with call bell/phone within reach;with family/visitor present   GP     Lanetta Inch Beth 10/14/2012, 9:56 AM Elwyn Reach, [place]

## 2012-10-14 NOTE — Progress Notes (Signed)
UR COMPLETED  

## 2012-10-14 NOTE — Plan of Care (Signed)
D/c instructions given to pt. No med scripts given to pt. Pt is stable to be d/c.

## 2012-10-15 ENCOUNTER — Encounter (HOSPITAL_COMMUNITY): Payer: Self-pay | Admitting: Neurosurgery

## 2012-10-30 ENCOUNTER — Encounter: Payer: Self-pay | Admitting: Cardiovascular Disease

## 2012-10-30 ENCOUNTER — Ambulatory Visit (INDEPENDENT_AMBULATORY_CARE_PROVIDER_SITE_OTHER): Payer: Medicare Other | Admitting: Cardiovascular Disease

## 2012-10-30 VITALS — BP 148/74 | HR 73 | Ht 61.0 in | Wt 199.0 lb

## 2012-10-30 DIAGNOSIS — I10 Essential (primary) hypertension: Secondary | ICD-10-CM | POA: Diagnosis not present

## 2012-10-30 DIAGNOSIS — E785 Hyperlipidemia, unspecified: Secondary | ICD-10-CM | POA: Diagnosis not present

## 2012-10-30 DIAGNOSIS — I2510 Atherosclerotic heart disease of native coronary artery without angina pectoris: Secondary | ICD-10-CM

## 2012-10-30 DIAGNOSIS — I5032 Chronic diastolic (congestive) heart failure: Secondary | ICD-10-CM

## 2012-10-30 MED ORDER — CARVEDILOL 3.125 MG PO TABS: 3.1250 mg | ORAL_TABLET | Freq: Two times a day (BID) | ORAL | Status: DC

## 2012-10-30 NOTE — Progress Notes (Signed)
Patient ID: [name], female   DOB: Aug 18, 1932, 79 y.o.   MRN: [id number]    SUBJECTIVE: [name] has a h/o prior hospitalizations for chest pain and has nonobstructive CAD, HTN, hyperlipidemia, and chronic diastolic heart failure. She had a  normal Dobutamine stress echo in April 2013. She recently underwent back surgery, and had bladder surgery prior to that. She said it went well. She says when her BP is high, she gets blurry vision and a little pain above her left eye. She's been taking 12.5 mg Toprol-XL bid. She has severe shortness of breath, and has known bronchitis. She gets occasional substernal chest pains. She said she had a sleep study last year and didn't require CPAP. She has severe panic attacks.  She has 12 grandchildren.   BP: 148/74 mmHg HR: 73 bpm  PHYSICAL EXAM General: NAD Neck: No JVD, no thyromegaly or thyroid nodule.  Lungs: Clear to auscultation bilaterally with normal respiratory effort. CV: Nondisplaced PMI.  Heart regular S1/S2, no S3/S4, no murmur.  No peripheral edema.  No carotid bruit.  Normal pedal pulses.  Abdomen: Soft, nontender, no hepatosplenomegaly, no distention.  Neurologic: Alert and oriented x 3.  Psych: Normal affect. Extremities: No clubbing or cyanosis.     LABS: Basic Metabolic Panel: No results found for this basename: NA, K, CL, CO2, GLUCOSE, BUN, CREATININE, CALCIUM, MG, PHOS,  in the last 72 hours Liver Function Tests: No results found for this basename: AST, ALT, ALKPHOS, BILITOT, PROT, ALBUMIN,  in the last 72 hours No results found for this basename: LIPASE, AMYLASE,  in the last 72 hours CBC: No results found for this basename: WBC, NEUTROABS, HGB, HCT, MCV, PLT,  in the last 72 hours Cardiac Enzymes: No results found for this basename: CKTOTAL, CKMB, CKMBINDEX, TROPONINI,  in the last 72 hours BNP: No components found with this basename: POCBNP,  D-Dimer: No results found for this basename: DDIMER,  in the last  72 hours Hemoglobin A1C: No results found for this basename: HGBA1C,  in the last 72 hours Fasting Lipid Panel: No results found for this basename: CHOL, HDL, LDLCALC, TRIG, CHOLHDL, LDLDIRECT,  in the last 72 hours Thyroid Function Tests: No results found for this basename: TSH, T4TOTAL, FREET3, T3FREE, THYROIDAB,  in the last 72 hours Anemia Panel: No results found for this basename: VITAMINB12, FOLATE, FERRITIN, TIBC, IRON, RETICCTPCT,  in the last 72 hours       ASSESSMENT AND PLAN: 1. Nonobstructive CAD: aggressive risk factor modification. Normal Dobutamine stress echo in April 2013. 2. Chronic diastolic HF: she takes her Lasix three times a week due to feeling weak afterwards, and it makes her cramp, but does take her potassium daily. Will switch Toprol-XL to Coreg 3.125 mg bid. 3. HTN: I will switch from Toprol-XL to Coreg 3.125 mg bid for more optimal control. I've asked her to check her BP 4-5 times a week and to report these values to me in 3 weeks. 4. Hyperlipidemia: will check lipids if not done recently.  [name], M.D., F.A.C.C.

## 2012-10-30 NOTE — Patient Instructions (Addendum)
   Stop Metoprolol  Begin Coreg 3.125mg  twice a day   Keep blood pressure log over the next 3 weeks and bring/mail to office Continue all other current medications. Your physician wants you to follow up in: 6 months.  You will receive a reminder letter in the mail one-two months in advance.  If you don't receive a letter, please call our office to schedule the follow up appointment    Blood Pressure Record Sheet Your blood pressure on this visit to the Emergency Department or Clinic is elevated. This does not necessarily mean you have hypertension, but it does mean that your blood pressure needs to be rechecked. Many times blood pressure increases because of illness, pain, anxiety, or other factors. We recommend that you get a series of blood pressure readings done over a period of about 5 days. It is best to get a reading in the morning and one in the evening. You should make sure you sit and relax for 1 to 5 minutes before the reading is taken. Write the readings down and make a follow-up appointment with your doctor to discuss the results. If there is not a free clinic or a drug store with blood pressure taking machines near you, you can purchase good blood pressure taking equipment from a drug store, often for less than the price of a physician's office call. Having one in the home also allows you the convenience of taking your blood pressure while you are home and in relaxed surroundings. Your blood pressure in the Emergency Department or Clinic on ________ was ____________________. BLOOD PRESSURE LOG Date: _______________________  a.m. _____________________  p.m. _____________________ Date: _______________________  a.m. _____________________  p.m. _____________________ Date: _______________________  a.m. _____________________  p.m. _____________________ Date: _______________________  a.m. _____________________  p.m. _____________________ Date: _______________________  a.m.  _____________________  p.m. _____________________ Date: _______________________  a.m. _____________________  p.m. _____________________ Date: _______________________  a.m. _____________________  p.m. _____________________ Date: _______________________  a.m. _____________________  p.m. _____________________ Date: _______________________  a.m. _____________________  p.m. _____________________ Date: _______________________  a.m. _____________________  p.m. _____________________

## 2012-11-09 DIAGNOSIS — F411 Generalized anxiety disorder: Secondary | ICD-10-CM | POA: Diagnosis not present

## 2012-12-11 DIAGNOSIS — K219 Gastro-esophageal reflux disease without esophagitis: Secondary | ICD-10-CM | POA: Diagnosis not present

## 2012-12-11 DIAGNOSIS — K649 Unspecified hemorrhoids: Secondary | ICD-10-CM | POA: Diagnosis not present

## 2012-12-11 DIAGNOSIS — M545 Low back pain: Secondary | ICD-10-CM | POA: Diagnosis not present

## 2012-12-11 DIAGNOSIS — J449 Chronic obstructive pulmonary disease, unspecified: Secondary | ICD-10-CM | POA: Diagnosis not present

## 2012-12-11 DIAGNOSIS — I10 Essential (primary) hypertension: Secondary | ICD-10-CM | POA: Diagnosis not present

## 2012-12-11 DIAGNOSIS — G4730 Sleep apnea, unspecified: Secondary | ICD-10-CM | POA: Diagnosis not present

## 2012-12-11 DIAGNOSIS — E782 Mixed hyperlipidemia: Secondary | ICD-10-CM | POA: Diagnosis not present

## 2012-12-11 DIAGNOSIS — K589 Irritable bowel syndrome without diarrhea: Secondary | ICD-10-CM | POA: Diagnosis not present

## 2013-01-20 ENCOUNTER — Telehealth: Payer: Self-pay | Admitting: Cardiovascular Disease

## 2013-01-20 NOTE — Telephone Encounter (Signed)
Daughter called stating that her mother is having some chest pains, shortness of breath,  States that her BP is going up and down. Patient does not want to go to ER.

## 2013-01-20 NOTE — Telephone Encounter (Signed)
Daughter states mother called her c/o SOB, some chest pains going into left arm and up into neck, thick tongue feeling occas, blurred vision & BP today 180/70's.  Does not know if she has taken any NTG.  States she must feel pretty bad because she has called her today.  Advised her to take her to ED for evaluation as these type symptoms are best handled there.  Advised her that she could go to [facility] or [facility].  Daughter verbalized understanding.

## 2013-01-27 ENCOUNTER — Ambulatory Visit (INDEPENDENT_AMBULATORY_CARE_PROVIDER_SITE_OTHER): Payer: Medicare Other | Admitting: Cardiovascular Disease

## 2013-01-27 ENCOUNTER — Encounter: Payer: Self-pay | Admitting: Cardiovascular Disease

## 2013-01-27 VITALS — BP 158/75 | HR 70 | Ht 61.0 in | Wt 202.0 lb

## 2013-01-27 DIAGNOSIS — I10 Essential (primary) hypertension: Secondary | ICD-10-CM

## 2013-01-27 DIAGNOSIS — I5032 Chronic diastolic (congestive) heart failure: Secondary | ICD-10-CM | POA: Diagnosis not present

## 2013-01-27 DIAGNOSIS — I2510 Atherosclerotic heart disease of native coronary artery without angina pectoris: Secondary | ICD-10-CM | POA: Diagnosis not present

## 2013-01-27 DIAGNOSIS — R079 Chest pain, unspecified: Secondary | ICD-10-CM | POA: Diagnosis not present

## 2013-01-27 MED ORDER — CARVEDILOL 6.25 MG PO TABS: 6.2500 mg | ORAL_TABLET | Freq: Two times a day (BID) | ORAL | Status: DC

## 2013-01-27 MED ORDER — RANOLAZINE ER 500 MG PO TB12: 500.0000 mg | ORAL_TABLET | Freq: Two times a day (BID) | ORAL | Status: DC

## 2013-01-27 NOTE — Patient Instructions (Signed)
   Stop Imdur  Increase Coreg to 6.25mg  twice a day  - new sent to [place] 500mg  twice a day  - new sent to pharm  Continue all other medications.   Your physician wants you to follow up in: 6 months.  You will receive a reminder letter in the mail one-two months in advance.  If you don't receive a letter, please call our office to schedule the follow up appointment

## 2013-01-27 NOTE — Progress Notes (Signed)
Patient ID: [name], female   DOB: [birth year], 79 y.o.   MRN: [id number]      SUBJECTIVE: [name] has a h/o prior hospitalizations for chest pain and has nonobstructive CAD, HTN, hyperlipidemia, and chronic diastolic heart failure. She had a normal Dobutamine stress echo in April 2013.  She has severe shortness of breath, and has known bronchitis. She gets occasional substernal chest pains, lasting 5 seconds at a time. She said she had a sleep study last year and didn't require CPAP. She has depression and severe panic attacks.   During her last office visit, I switched Toprol-XL to Coreg. She's been having intermittent chest pain radiating into the left arm along with BP fluctuations as high as 180/70 mmHg. Again, these last a few seconds at a time.  She did call EMS once for high BP but refused hospitalization. As per the patient and her daughter (from the [place]. area), her SBP was over 200 mmHg. She has brought in a log and her SBP stays elevated in the 150-160 mmHg range consistently.  She likes to do crafts to alleviate stress.      Allergies  Allergen Reactions  . Phenothiazines Anaphylaxis and Hives  . Polysorbate Anaphylaxis  . Prednisone Shortness Of Breath  . Statins Other (See Comments)    vomiting  . Codeine Itching  . Atorvastatin Hives  . Penicillins     unknown  . Pimozide Hives and Itching  . Prochlorperazine Other (See Comments)    unknown  . Ofloxacin Rash  . Other Itching and Rash    "WOOL"= make skin look like it has been burned    Current Outpatient Prescriptions  Medication Sig Dispense Refill  . ALPRAZolam (XANAX) 0.25 MG tablet Take 0.25 mg by mouth 3 (three) times daily as needed. for anxiety      . aspirin 81 MG EC tablet Take 81 mg by mouth daily.        . Biotin 5 MG TABS Take 5 mg by mouth daily.      . Calcium Carbonate-Vit D-Min (CALCIUM 600+D PLUS MINERALS) 600-400 MG-UNIT TABS Take 1 tablet by mouth daily.       . carvedilol (COREG)  3.125 MG tablet Take 1 tablet (3.125 mg total) by mouth 2 (two) times daily.  60 tablet  6  . Cranberry-Vitamin C-Vitamin E (CRANBERRY PLUS VITAMIN C) 140-100-3 MG-MG-UNIT CAPS Take 1 capsule by mouth daily.       . cyclobenzaprine (FLEXERIL) 10 MG tablet Take 10 mg by mouth 3 (three) times daily as needed. TID as needed      . DULoxetine (CYMBALTA) 60 MG capsule Take 60 mg by mouth at bedtime.       . fluticasone (FLONASE) 50 MCG/ACT nasal spray Place 1 spray into the nose daily. 1 spray by Nasal route daily.      . furosemide (LASIX) 20 MG tablet Take 20 mg by mouth daily as needed for fluid.       . isosorbide mononitrate (IMDUR) 120 MG 24 hr tablet Take 120 mg by mouth daily.        . nitroGLYCERIN (NITROSTAT) 0.4 MG SL tablet Place 0.4 mg under the tongue every 5 (five) minutes as needed. For chest pain      . Omega-3 Fatty Acids (FISH OIL) 1000 MG CAPS Take 1 capsule by mouth daily.        . pantoprazole (PROTONIX) 40 MG tablet Take 40 mg by mouth daily.        [name] Kitchen  polyethylene glycol (MIRALAX / GLYCOLAX) packet Take 17 g by mouth daily as needed. For constipation      . potassium chloride SA (K-DUR,KLOR-CON) 20 MEQ tablet Take 20 mEq by mouth daily.      . pramipexole (MIRAPEX) 0.125 MG tablet Take 0.125 mg by mouth at bedtime. qhs      . traMADol (ULTRAM) 50 MG tablet Take 50 mg by mouth 2 (two) times daily as needed. For pain      . vitamin E 400 UNIT capsule Take 400 Units by mouth daily.       No current facility-administered medications for this visit.    Past Medical History  Diagnosis Date  . PVD (peripheral vascular disease)   . Other and unspecified hyperlipidemia   . Coronary atherosclerosis of native coronary artery     nonobstructive CAD by multiple catheterizations  . Panic disorder without agoraphobia   . HTN (hypertension)     Hx of it  . MI (myocardial infarction) 2006  . Internal hemorrhoids without mention of complication   . Dysphagia, unspecified(787.20)   .  Microscopic colitis 2003  . GERD (gastroesophageal reflux disease)     Hx Schatzki's ring, multiple EGD/ED last 01/06/2004  . Hyperlipemia   . Thyroid disease     recent abnl TSH per pt  . S/P colonoscopy 09/27/2001    internal hemorrhoids, desc colon inflam polyp, SB BX-chronic duodenitis, colitis  . Arthritis   . Shortness of breath   . Heart disease   . Bursitis     left shoulder  . Hyperlipidemia   . Sleep disorder     obstructive  . COPD (chronic obstructive pulmonary disease)   . Complication of anesthesia   . PONV (postoperative nausea and vomiting)     'a little nausea"  . Anxiety   . Depression   . Pneumonia 12/2011  . Heart murmur     'a littel'  . Cataract     Past Surgical History  Procedure Laterality Date  . Tonsillectomy    . Unspecified area, hysterectomy  1972    partial  . Cholecystectomy  1998  . Right knee replacement  2007  . Right leg benign tumor    . Breast lumpectomy  1998    left, benign  . Left hand surgery    . Left rotator cuff surgery    . Nasal sinus surgery    . Bladder tack  06/2010  . Cardiac catheterization    . Maloney dilation  03/16/2011    Gastritis. No H.pylori on bx. 98F maloney dilation with disruption of  occult cevical esophageal web  . Colonoscopy  03/16/2011    multiple hyperplastic colon polyps, sigmoid diverticulosis, melanosis coli  . Bladder suspension  11/09/2011    Procedure: TRANSVAGINAL TAPE (TVT) PROCEDURE;  Surgeon: Marissa Nestle, MD;  Location: [facility];  Service: Urology;  Laterality: N/A;  . Abdominal hysterectomy    . Appendectomy    . Carpal tunnel release  1989    left  . Anterior and posterior repair      with resection of vagina  . Cardiac catheterization    . Shoulder surgery Left   . Joint replacement Right 2007  . Lumbar laminectomy/decompression microdiscectomy N/A 10/11/2012    Procedure: LUMBAR LAMINECTOMY/DECOMPRESSION MICRODISCECTOMY 2 LEVELS;  Surgeon: Floyce Stakes, MD;  Location: [facility];  Service: Neurosurgery;  Laterality: N/A;  L3-4 L4-5 Laminectomy    History   Social History  .  Marital Status: Divorced    Spouse Name: N/A    Number of Children: 20  . Years of Education: N/A   Occupational History  . retired    Social History Main Topics  . Smoking status: Former Smoker -- 1.00 packs/day for 64 years    Types: Cigarettes    Quit date: 11/17/2001  . Smokeless tobacco: Never Used     Comment: no tobacco  . Alcohol Use: No  . Drug Use: No  . Sexual Activity: No   Other Topics Concern  . Not on file   Social History Narrative   Divorced.   Sister had colon perforation & died from complications in [place], [place] Vitals:   01/27/13 1402  BP: 158/75  Pulse: 70  Height: 5\' 1"  (1.549 m)  Weight: 202 lb (91.627 kg)  SpO2: 95%    PHYSICAL EXAM General: NAD Neck: No JVD, no thyromegaly or thyroid nodule.  Lungs: Clear to auscultation bilaterally with normal respiratory effort. CV: Nondisplaced PMI.  Heart regular S1/S2, no S3/S4, no murmur.  No peripheral edema.  No carotid bruit.  Normal pedal pulses.  Abdomen: Soft, nontender, no hepatosplenomegaly, no distention.  Neurologic: Alert and oriented x 3.  Psych: Normal affect. Extremities: No clubbing or cyanosis.   ECG: reviewed and available in electronic records. (NSR with poor R wave progression)      ASSESSMENT AND PLAN: 1. Nonobstructive CAD: aggressive risk factor modification. Normal Dobutamine stress echo in April 2013.  2. Chronic diastolic HF: she takes her Lasix three times a week due to feeling weak afterwards, and it makes her cramp, but does take her potassium daily. Will continue Coreg, but increase to 6.25 mg bid.  3. HTN: I will increase Coreg to 6.25 mg bid for more optimal control. I've asked her to check her BP 4-5 times a week and to report these values to me in 3 weeks.  4. Hyperlipidemia: will check lipids if not done recently. 5. Chest pain: very atypical for  coronary disease, lasting only seconds. She may have developed nitrate tolerance so I will d/c Imdur to give her a nitrate-free interval. I will start Ranexa 500 mg bid. May be more related to bronchitis/COPD.  Dispo: f/u 6 months.  Kate Sable, M.D., F.A.C.C.

## 2013-01-30 DIAGNOSIS — M545 Low back pain: Secondary | ICD-10-CM | POA: Diagnosis not present

## 2013-02-05 ENCOUNTER — Other Ambulatory Visit: Payer: Self-pay | Admitting: Neurosurgery

## 2013-02-05 DIAGNOSIS — M5416 Radiculopathy, lumbar region: Secondary | ICD-10-CM

## 2013-02-10 ENCOUNTER — Ambulatory Visit
Admission: RE | Admit: 2013-02-10 | Discharge: 2013-02-10 | Disposition: A | Discharge status: Home / Self Care | Payer: Medicare Other | Source: Ambulatory Visit | Attending: Neurosurgery | Admitting: Neurosurgery

## 2013-02-10 VITALS — BP 147/69 | HR 62

## 2013-02-10 DIAGNOSIS — M5416 Radiculopathy, lumbar region: Secondary | ICD-10-CM

## 2013-02-10 MED ORDER — DIAZEPAM 5 MG PO TABS
5.0000 mg | ORAL_TABLET | Freq: Once | ORAL | Status: AC
  Administered 2013-02-10: 5 mg via ORAL

## 2013-02-10 MED ORDER — IOHEXOL 180 MG/ML  SOLN
18.0000 mL | Freq: Once | INTRAMUSCULAR | Status: AC | PRN
  Administered 2013-02-10: 18 mL via INTRATHECAL

## 2013-02-10 NOTE — Progress Notes (Signed)
Patient states she has been off Tramadol and Cymbalta for the past two days.

## 2013-02-11 DIAGNOSIS — IMO0002 Reserved for concepts with insufficient information to code with codable children: Secondary | ICD-10-CM | POA: Diagnosis not present

## 2013-02-11 DIAGNOSIS — M545 Low back pain: Secondary | ICD-10-CM | POA: Diagnosis not present

## 2013-02-11 DIAGNOSIS — M5410 Radiculopathy, site unspecified: Secondary | ICD-10-CM | POA: Insufficient documentation

## 2013-04-01 ENCOUNTER — Telehealth: Payer: Self-pay | Admitting: Cardiology

## 2013-04-01 ENCOUNTER — Emergency Department (HOSPITAL_COMMUNITY)
Admission: EM | Admit: 2013-04-01 | Discharge: 2013-04-01 | Disposition: A | Discharge status: Home / Self Care | Payer: Medicare Other | Attending: Emergency Medicine | Admitting: Emergency Medicine

## 2013-04-01 ENCOUNTER — Encounter (HOSPITAL_COMMUNITY): Payer: Self-pay | Admitting: Emergency Medicine

## 2013-04-01 ENCOUNTER — Emergency Department (HOSPITAL_COMMUNITY): Payer: Medicare Other

## 2013-04-01 DIAGNOSIS — R110 Nausea: Secondary | ICD-10-CM | POA: Diagnosis not present

## 2013-04-01 DIAGNOSIS — R079 Chest pain, unspecified: Secondary | ICD-10-CM | POA: Diagnosis not present

## 2013-04-01 DIAGNOSIS — Z8669 Personal history of other diseases of the nervous system and sense organs: Secondary | ICD-10-CM | POA: Diagnosis not present

## 2013-04-01 DIAGNOSIS — R0609 Other forms of dyspnea: Secondary | ICD-10-CM | POA: Insufficient documentation

## 2013-04-01 DIAGNOSIS — Z87891 Personal history of nicotine dependence: Secondary | ICD-10-CM | POA: Diagnosis not present

## 2013-04-01 DIAGNOSIS — K219 Gastro-esophageal reflux disease without esophagitis: Secondary | ICD-10-CM | POA: Insufficient documentation

## 2013-04-01 DIAGNOSIS — Z7982 Long term (current) use of aspirin: Secondary | ICD-10-CM | POA: Diagnosis not present

## 2013-04-01 DIAGNOSIS — J449 Chronic obstructive pulmonary disease, unspecified: Secondary | ICD-10-CM | POA: Diagnosis not present

## 2013-04-01 DIAGNOSIS — Z880 Allergy status to penicillin: Secondary | ICD-10-CM | POA: Insufficient documentation

## 2013-04-01 DIAGNOSIS — R0989 Other specified symptoms and signs involving the circulatory and respiratory systems: Secondary | ICD-10-CM | POA: Insufficient documentation

## 2013-04-01 DIAGNOSIS — Z95818 Presence of other cardiac implants and grafts: Secondary | ICD-10-CM | POA: Insufficient documentation

## 2013-04-01 DIAGNOSIS — F411 Generalized anxiety disorder: Secondary | ICD-10-CM | POA: Insufficient documentation

## 2013-04-01 DIAGNOSIS — F329 Major depressive disorder, single episode, unspecified: Secondary | ICD-10-CM | POA: Diagnosis not present

## 2013-04-01 DIAGNOSIS — Z79899 Other long term (current) drug therapy: Secondary | ICD-10-CM | POA: Insufficient documentation

## 2013-04-01 DIAGNOSIS — R011 Cardiac murmur, unspecified: Secondary | ICD-10-CM | POA: Diagnosis not present

## 2013-04-01 DIAGNOSIS — E669 Obesity, unspecified: Secondary | ICD-10-CM | POA: Diagnosis not present

## 2013-04-01 DIAGNOSIS — I10 Essential (primary) hypertension: Secondary | ICD-10-CM | POA: Insufficient documentation

## 2013-04-01 DIAGNOSIS — J4489 Other specified chronic obstructive pulmonary disease: Secondary | ICD-10-CM | POA: Insufficient documentation

## 2013-04-01 DIAGNOSIS — Z8701 Personal history of pneumonia (recurrent): Secondary | ICD-10-CM | POA: Diagnosis not present

## 2013-04-01 DIAGNOSIS — M129 Arthropathy, unspecified: Secondary | ICD-10-CM | POA: Diagnosis not present

## 2013-04-01 DIAGNOSIS — F3289 Other specified depressive episodes: Secondary | ICD-10-CM | POA: Insufficient documentation

## 2013-04-01 DIAGNOSIS — I252 Old myocardial infarction: Secondary | ICD-10-CM | POA: Insufficient documentation

## 2013-04-01 DIAGNOSIS — I2510 Atherosclerotic heart disease of native coronary artery without angina pectoris: Secondary | ICD-10-CM | POA: Insufficient documentation

## 2013-04-01 LAB — BASIC METABOLIC PANEL
CO2: 24 mEq/L (ref 19–32)
Calcium: 9.5 mg/dL (ref 8.4–10.5)
Creatinine, Ser: 1.09 mg/dL (ref 0.50–1.10)
GFR calc Af Amer: 55 mL/min — ABNORMAL LOW (ref >90)
Glucose, Bld: 128 mg/dL — ABNORMAL HIGH (ref 70–99)
Sodium: 136 mEq/L (ref 135–145)

## 2013-04-01 LAB — CBC WITH DIFFERENTIAL/PLATELET
Eosinophils Absolute: 0.2 10*3/uL (ref 0.0–0.7)
Eosinophils Relative: 4 % (ref 0–5)
Lymphocytes Relative: 42 % (ref 12–46)
Lymphs Abs: 2.5 10*3/uL (ref 0.7–4.0)
MCH: 31.5 pg (ref 26.0–34.0)
MCV: 90.2 fL (ref 78.0–100.0)
Platelets: 260 10*3/uL (ref 150–400)
RBC: 4.1 MIL/uL (ref 3.87–5.11)

## 2013-04-01 LAB — TROPONIN I: Troponin I: <0.3 ng/mL (ref <0.30)

## 2013-04-01 NOTE — ED Notes (Signed)
Pt c/o cp radiating to left arm and neck x 3 days with dizziness/n/blurred vision.

## 2013-04-01 NOTE — Telephone Encounter (Signed)
Patient called and complained of sever headaches and feels pressure in her face. Pt complains that she gets really hot all over and feet are cold. She complains of pain under left breast and spreads to left arm. Patient rated pain as 6/10 and pain complains of shortness of breath. Asked pt if she had any nitro and pt stated she did but she had not taken any. Pt stated that she called family doctor and nurse told her to go to the ER. Pt complains that she is numb all over, her hands and feet. I informed pt that she needed to go to ER or call 911. Informed pt to go to [facility] ER. Pt verbalized understanding and said she would get her daughter to take her to [name] around 1 pm when her daughter gets there.

## 2013-04-01 NOTE — ED Notes (Signed)
Discharge instructions reviewed with pt, questions answered. Pt verbalized understanding.  

## 2013-04-01 NOTE — ED Provider Notes (Signed)
CSN: [id number]     Arrival date & time 04/01/13  1347 History   First MD Initiated Contact with Patient 04/01/13 1410     Chief Complaint  Patient presents with  . Chest Pain   (Consider location/radiation/quality/duration/timing/severity/associated sxs/prior Treatment) HPI..... intermittent bilateral chest pain for 6 weeks worse the past 3 days.  Pain is described as a sharp and radiates to the left arm and left neck.   Symptoms are fleeting lasting approximately 5 seconds.   There is also been some minimal dyspnea and nausea. No diaphoresis. Patient has known coronary artery disease and has a cardiologist. She is ambulatory. Severity is mild  Past Medical History  Diagnosis Date  . PVD (peripheral vascular disease)   . Other and unspecified hyperlipidemia   . Coronary atherosclerosis of native coronary artery     nonobstructive CAD by multiple catheterizations  . Panic disorder without agoraphobia   . HTN (hypertension)     Hx of it  . MI (myocardial infarction) 2006  . Internal hemorrhoids without mention of complication   . Dysphagia, unspecified(787.20)   . Microscopic colitis 2003  . GERD (gastroesophageal reflux disease)     Hx Schatzki's ring, multiple EGD/ED last 01/06/2004  . Hyperlipemia   . Thyroid disease     recent abnl TSH per pt  . S/P colonoscopy 09/27/2001    internal hemorrhoids, desc colon inflam polyp, SB BX-chronic duodenitis, colitis  . Arthritis   . Shortness of breath   . Heart disease   . Bursitis     left shoulder  . Hyperlipidemia   . Sleep disorder     obstructive  . COPD (chronic obstructive pulmonary disease)   . Complication of anesthesia   . PONV (postoperative nausea and vomiting)     'a little nausea"  . Anxiety   . Depression   . Pneumonia 12/2011  . Heart murmur     'a littel'  . Cataract    Past Surgical History  Procedure Laterality Date  . Tonsillectomy    . Unspecified area, hysterectomy  1972    partial  . Cholecystectomy   1998  . Right knee replacement  2007  . Right leg benign tumor    . Breast lumpectomy  1998    left, benign  . Left hand surgery    . Left rotator cuff surgery    . Nasal sinus surgery    . Bladder tack  06/2010  . Cardiac catheterization    . Maloney dilation  03/16/2011    Gastritis. No H.pylori on bx. 29F maloney dilation with disruption of  occult cevical esophageal web  . Colonoscopy  03/16/2011    multiple hyperplastic colon polyps, sigmoid diverticulosis, melanosis coli  . Bladder suspension  11/09/2011    Procedure: TRANSVAGINAL TAPE (TVT) PROCEDURE;  Surgeon: Marissa Nestle, MD;  Location: [facility];  Service: Urology;  Laterality: N/A;  . Abdominal hysterectomy    . Appendectomy    . Carpal tunnel release  1989    left  . Anterior and posterior repair      with resection of vagina  . Cardiac catheterization    . Shoulder surgery Left   . Joint replacement Right 2007  . Lumbar laminectomy/decompression microdiscectomy N/A 10/11/2012    Procedure: LUMBAR LAMINECTOMY/DECOMPRESSION MICRODISCECTOMY 2 LEVELS;  Surgeon: Floyce Stakes, MD;  Location: [facility];  Service: Neurosurgery;  Laterality: N/A;  L3-4 L4-5 Laminectomy   Family History  Problem Relation Age of Onset  .  Coronary artery disease Other     family Hx-sons  . Cancer Other   . Stroke Other     family Hx  . Hypertension Other     family Hx  . Diabetes Brother    History  Substance Use Topics  . Smoking status: Former Smoker -- 1.00 packs/day for 64 years    Types: Cigarettes    Quit date: 11/17/2001  . Smokeless tobacco: Never Used     Comment: no tobacco  . Alcohol Use: No   OB History   Grav Para Term Preterm Abortions TAB SAB Ect Mult Living   8 6   2     5      Review of Systems  All other systems reviewed and are negative.    Allergies  Phenothiazines; Polysorbate; Prednisone; Codeine; Statins; Prochlorperazine; Atorvastatin; Ofloxacin; Other; Penicillins; and Pimozide  Home  Medications   Current Outpatient Rx  Name  Route  Sig  Dispense  Refill  . aspirin 81 MG EC tablet   Oral   Take 81 mg by mouth every morning.          . carvedilol (COREG) 6.25 MG tablet   Oral   Take 1 tablet (6.25 mg total) by mouth 2 (two) times daily.   60 tablet   6     Dose increased 01/27/2013   . DULoxetine (CYMBALTA) 60 MG capsule   Oral   Take 60 mg by mouth at bedtime.          . furosemide (LASIX) 20 MG tablet   Oral   Take 20 mg by mouth daily as needed for fluid.          . nitroGLYCERIN (NITROSTAT) 0.4 MG SL tablet   Sublingual   Place 0.4 mg under the tongue every 5 (five) minutes as needed. For chest pain         . pantoprazole (PROTONIX) 40 MG tablet   Oral   Take 40 mg by mouth every morning.          . polyethylene glycol (MIRALAX / GLYCOLAX) packet   Oral   Take 17 g by mouth daily as needed. For constipation         . POTASSIUM GLUCONATE PO   Oral   Take 500 mg by mouth every morning.         . ranolazine (RANEXA) 500 MG 12 hr tablet   Oral   Take 1 tablet (500 mg total) by mouth 2 (two) times daily.   60 tablet   6     Stop Imdur, med changed 01/27/2013   . traMADol (ULTRAM) 50 MG tablet   Oral   Take 50 mg by mouth 2 (two) times daily as needed. For pain         . vitamin E 400 UNIT capsule   Oral   Take 400 Units by mouth daily.          BP 126/62  Pulse 78  Temp(Src) 97.9 F (36.6 C) (Oral)  Resp 18  SpO2 92% Physical Exam  Nursing note and vitals reviewed. Constitutional: She is oriented to person, place, and time.  Obese, no acute distress.  HENT:  Head: Normocephalic and atraumatic.  Eyes: Conjunctivae and EOM are normal. Pupils are equal, round, and reactive to light.  Neck: Normal range of motion. Neck supple.  Cardiovascular: Normal rate, regular rhythm and normal heart sounds.   Pulmonary/Chest: Effort normal and breath sounds normal.  Abdominal: Soft.  Bowel sounds are normal.   Musculoskeletal: Normal range of motion.  Neurological: She is alert and oriented to person, place, and time.  Skin: Skin is warm and dry.  Psychiatric: She has a normal mood and affect. Her behavior is normal.    ED Course  Procedures (including critical care time) Labs Review Labs Reviewed  BASIC METABOLIC PANEL - Abnormal; Notable for the following:    Glucose, Bld 128 (*)    GFR calc non Af Amer 47 (*)    GFR calc Af Amer 55 (*)    All other components within normal limits  CBC WITH DIFFERENTIAL  TROPONIN I   Imaging Review Dg Chest Port 1 View  04/01/2013   CLINICAL DATA:  Chest pain  EXAM: PORTABLE CHEST - 1 VIEW  COMPARISON:  October 11, 2012  FINDINGS: Lungs are clear. Heart size and pulmonary vascularity are normal. No adenopathy. No pneumothorax. No bone lesions.  IMPRESSION: No abnormality noted.   Electronically Signed   By: Lowella Grip M.D.   On: 04/01/2013 14:36    EKG Interpretation    Date/Time:  Tuesday April 01 2013 13:54:32 EST Ventricular Rate:  72 PR Interval:  148 QRS Duration: 76 QT Interval:  406 QTC Calculation: 444 R Axis:   -12 Text Interpretation:  Normal sinus rhythm Low voltage QRS Nonspecific ST abnormality Abnormal ECG When compared with ECG of 11-Oct-2012 07:42, Sinus rhythm has replaced Ectopic atrial rhythm Criteria for Septal infarct are no longer Present Confirmed by Twisha Vanpelt  MD, Vernestine Brodhead ([id number]) on 04/01/2013 4:26:52 PM            MDM   1. Chest pain    Patient is hemodynamically stable.   Hemoglobin, troponin, EKG, chest x-ray all normal.    Patient has cardiology follow up. She is in no acute distress.    Nat Christen, MD 04/01/13 [phone]

## 2013-04-17 HISTORY — PX: CORONARY ANGIOPLASTY WITH STENT PLACEMENT: SHX49

## 2013-04-25 ENCOUNTER — Telehealth: Payer: Self-pay | Admitting: *Deleted

## 2013-04-25 NOTE — Telephone Encounter (Signed)
Pt called stating her chest is hurting and states she is on corcicidin for high BP pt took blood pressure this morning is was 196/77. Pt states her throat feels swollen and she is having SOB. I spoke with the nurse and I advise pt to go to the ER or to call her PCP. Pt states she is going to the [facility] ER.

## 2013-05-08 ENCOUNTER — Encounter: Payer: Self-pay | Admitting: *Deleted

## 2013-05-08 ENCOUNTER — Encounter: Payer: Self-pay | Admitting: Cardiovascular Disease

## 2013-05-08 ENCOUNTER — Other Ambulatory Visit: Payer: Self-pay | Admitting: Cardiovascular Disease

## 2013-05-08 ENCOUNTER — Telehealth: Payer: Self-pay | Admitting: Cardiovascular Disease

## 2013-05-08 ENCOUNTER — Ambulatory Visit (INDEPENDENT_AMBULATORY_CARE_PROVIDER_SITE_OTHER): Payer: Medicare Other | Admitting: Cardiovascular Disease

## 2013-05-08 VITALS — BP 165/82 | HR 71 | Ht 61.0 in | Wt 198.0 lb

## 2013-05-08 DIAGNOSIS — I10 Essential (primary) hypertension: Secondary | ICD-10-CM | POA: Diagnosis not present

## 2013-05-08 DIAGNOSIS — E785 Hyperlipidemia, unspecified: Secondary | ICD-10-CM

## 2013-05-08 DIAGNOSIS — I5032 Chronic diastolic (congestive) heart failure: Secondary | ICD-10-CM

## 2013-05-08 DIAGNOSIS — I200 Unstable angina: Secondary | ICD-10-CM

## 2013-05-08 DIAGNOSIS — I2510 Atherosclerotic heart disease of native coronary artery without angina pectoris: Secondary | ICD-10-CM

## 2013-05-08 MED ORDER — CARVEDILOL 12.5 MG PO TABS: 12.5000 mg | ORAL_TABLET | Freq: Two times a day (BID) | ORAL | Status: DC

## 2013-05-08 NOTE — Telephone Encounter (Signed)
Left heart cath - Wednesday, 05/14/2013 - 10:00 - cooper

## 2013-05-08 NOTE — Patient Instructions (Signed)
   Increase Coreg to 12.5mg  twice a day  - new sent to pharm  Continue all other medications.   Your physician has requested that you have a cardiac catheterization. Cardiac catheterization is used to diagnose and/or treat various heart conditions. Doctors may recommend this procedure for a number of different reasons. The most common reason is to evaluate chest pain. Chest pain can be a symptom of coronary artery disease (CAD), and cardiac catheterization can show whether plaque is narrowing or blocking your heart's arteries. This procedure is also used to evaluate the valves, as well as measure the blood flow and oxygen levels in different parts of your heart. For further information please visit [email or web address]. Please follow instruction sheet, as given. Follow up will be given at time of discharge

## 2013-05-08 NOTE — Progress Notes (Signed)
Patient ID: [name], female   DOB: [birth year], 80 y.o.   MRN: [id number]      SUBJECTIVE: Mrs. Goulart has a h/o prior hospitalizations for chest pain and had moderate nonobstructive CAD as per coronary angiography in 09/2008, HTN, hyperlipidemia, and chronic diastolic heart failure, along with depression and severe panic attacks. She had a normal Dobutamine stress echo in April 2013.  She has severe shortness of breath, and has known bronchitis. She now has significant precordial chest pain radiating into her neck, left shoulder, and arm occurring on a daily basis. At her last visit, I thought that she may have developed a nitrate tolerance so nitrates were discontinued and I started her on Ranexa 500 mg twice daily. However, this has not alleviated her chest pain. Her blood pressure also remains elevated. She experiences chest pain with minimal activity as well as at rest. She was evaluated in the ED at [facility] in mid December 2014. Her ECG at that time showed normal sinus rhythm with a mild nonspecific ST segment abnormality. An ECG today performed in the office shows normal sinus rhythm, 67 beats per minute, with a nonspecific T wave abnormality in leads aVL and lead III.      Allergies  Allergen Reactions  . Phenothiazines Anaphylaxis and Hives  . Polysorbate Anaphylaxis  . Prednisone Shortness Of Breath  . Codeine Itching  . Statins Nausea And Vomiting       . Prochlorperazine Other (See Comments)    unknown  . Atorvastatin Hives  . Ofloxacin Rash  . Other Itching and Rash    "WOOL"= make skin look like it has been burned  . Penicillins Other (See Comments)    Causes redness all over.   . Pimozide Hives and Itching    Current Outpatient Prescriptions  Medication Sig Dispense Refill  . carvedilol (COREG) 6.25 MG tablet Take 1 tablet (6.25 mg total) by mouth 2 (two) times daily.  60 tablet  6  . ranolazine (RANEXA) 500 MG 12 hr tablet Take 1 tablet (500 mg  total) by mouth 2 (two) times daily.  60 tablet  6  . aspirin 81 MG EC tablet Take 81 mg by mouth every morning.       . DULoxetine (CYMBALTA) 60 MG capsule Take 60 mg by mouth at bedtime.       . furosemide (LASIX) 20 MG tablet Take 20 mg by mouth daily as needed for fluid.       . nitroGLYCERIN (NITROSTAT) 0.4 MG SL tablet Place 0.4 mg under the tongue every 5 (five) minutes as needed. For chest pain      . pantoprazole (PROTONIX) 40 MG tablet Take 40 mg by mouth every morning.       . polyethylene glycol (MIRALAX / GLYCOLAX) packet Take 17 g by mouth daily as needed. For constipation      . POTASSIUM GLUCONATE PO Take 500 mg by mouth every morning.      . traMADol (ULTRAM) 50 MG tablet Take 50 mg by mouth 2 (two) times daily as needed. For pain      . vitamin E 400 UNIT capsule Take 400 Units by mouth daily.       No current facility-administered medications for this visit.    Past Medical History  Diagnosis Date  . PVD (peripheral vascular disease)   . Other and unspecified hyperlipidemia   . Coronary atherosclerosis of native coronary artery     nonobstructive CAD by multiple  catheterizations  . Panic disorder without agoraphobia   . HTN (hypertension)     Hx of it  . MI (myocardial infarction) 2006  . Internal hemorrhoids without mention of complication   . Dysphagia, unspecified(787.20)   . Microscopic colitis 2003  . GERD (gastroesophageal reflux disease)     Hx Schatzki's ring, multiple EGD/ED last 01/06/2004  . Hyperlipemia   . Thyroid disease     recent abnl TSH per pt  . S/P colonoscopy 09/27/2001    internal hemorrhoids, desc colon inflam polyp, SB BX-chronic duodenitis, colitis  . Arthritis   . Shortness of breath   . Heart disease   . Bursitis     left shoulder  . Hyperlipidemia   . Sleep disorder     obstructive  . COPD (chronic obstructive pulmonary disease)   . Complication of anesthesia   . PONV (postoperative nausea and vomiting)     'a little nausea"    . Anxiety   . Depression   . Pneumonia 12/2011  . Heart murmur     'a littel'  . Cataract     Past Surgical History  Procedure Laterality Date  . Tonsillectomy    . Unspecified area, hysterectomy  1972    partial  . Cholecystectomy  1998  . Right knee replacement  2007  . Right leg benign tumor    . Breast lumpectomy  1998    left, benign  . Left hand surgery    . Left rotator cuff surgery    . Nasal sinus surgery    . Bladder tack  06/2010  . Cardiac catheterization    . Maloney dilation  03/16/2011    Gastritis. No H.pylori on bx. 53F maloney dilation with disruption of  occult cevical esophageal web  . Colonoscopy  03/16/2011    multiple hyperplastic colon polyps, sigmoid diverticulosis, melanosis coli  . Bladder suspension  11/09/2011    Procedure: TRANSVAGINAL TAPE (TVT) PROCEDURE;  Surgeon: Marissa Nestle, MD;  Location: [facility];  Service: Urology;  Laterality: N/A;  . Abdominal hysterectomy    . Appendectomy    . Carpal tunnel release  1989    left  . Anterior and posterior repair      with resection of vagina  . Cardiac catheterization    . Shoulder surgery Left   . Joint replacement Right 2007  . Lumbar laminectomy/decompression microdiscectomy N/A 10/11/2012    Procedure: LUMBAR LAMINECTOMY/DECOMPRESSION MICRODISCECTOMY 2 LEVELS;  Surgeon: Floyce Stakes, MD;  Location: [facility];  Service: Neurosurgery;  Laterality: N/A;  L3-4 L4-5 Laminectomy    History   Social History  . Marital Status: Divorced    Spouse Name: N/A    Number of Children: 29  . Years of Education: N/A   Occupational History  . retired    Social History Main Topics  . Smoking status: Former Smoker -- 1.00 packs/day for 64 years    Types: Cigarettes    Quit date: 11/17/2001  . Smokeless tobacco: Never Used     Comment: no tobacco  . Alcohol Use: No  . Drug Use: No  . Sexual Activity: No   Other Topics Concern  . Not on file   Social History Narrative   Divorced.    Sister had colon perforation & died from complications in [place], [place] Vitals:   05/08/13 1410  Height: 5\' 1"  (1.549 m)  Weight: 198 lb (89.812 kg)   BP 165/82 Pulse 71  PHYSICAL EXAM General: NAD Neck: No JVD, no thyromegaly or thyroid nodule.  Lungs: Clear to auscultation bilaterally with normal respiratory effort. CV: Nondisplaced PMI.  Heart regular S1/S2, no S3/S4, no murmur.  No peripheral edema.  No carotid bruit.  Normal pedal pulses.  Abdomen: Soft, nontender, no hepatosplenomegaly, no distention.  Neurologic: Alert and oriented x 3.  Psych: Normal affect. Extremities: No clubbing or cyanosis.   ECG: reviewed and available in electronic records.  Coronary angiography (10-14-2008): Left main coronary artery was calcified  proximally. There was a 30% discrete stenosis.  The LAD was also calcified in its proximal mid vessel. There was 50-60%  proximal disease. It was a small vessel distally. First diagonal  branch was normal. Circumflex coronary artery was large and left  dominant. There was a 30% proximal lesion. There was a 60% lesion in  the midvessel after the first obtuse marginal branch. The left-sided  PDA had a 40% midvessel lesion.  The right coronary artery was small and nondominant with 40-50% disease  in the proximal and midvessel.    ASSESSMENT AND PLAN: 1. Chest pain/angina: Her symptoms represent CCS class III-IV angina. I am concerned that her proximal LAD stenosis of 50-60% noted in June 2010 may have progressed. She may also have had progression of the 60% lesion noted in the left circumflex coronary artery. Normal Dobutamine stress echo in April 2013. I will arrange for left heart catheterization with coronary angiography within the next 7 days. Continue Ranexa 500 mg bid for time being. GFR most recently 47 ml/min. Will hold Lasix until after cath. 2. Chronic diastolic HF: she takes her Lasix three times a week due to feeling weak afterwards,  and it makes her cramp, but does take her potassium daily. Will increase Coreg to 12.5 mg bid.  3. HTN: I will increase Coreg to 12.5 mg bid for more optimal control. I've asked her to check her BP 3-4 times a week and to report these values at her next visit. 4. Hyperlipidemia: will check lipids if not done recently.    Dispo: f/u after cath.    Kate Sable, M.D., F.A.C.C.

## 2013-05-08 NOTE — Telephone Encounter (Signed)
Pt has Medicare and MCR supplement.  No precert required.

## 2013-05-09 ENCOUNTER — Encounter (HOSPITAL_COMMUNITY): Payer: Self-pay | Admitting: Pharmacy Technician

## 2013-05-14 ENCOUNTER — Encounter (HOSPITAL_COMMUNITY)
Admission: RE | Disposition: A | Discharge status: Home / Self Care | Payer: Medicare Other | Source: Ambulatory Visit | Attending: Cardiovascular Disease

## 2013-05-14 ENCOUNTER — Encounter (HOSPITAL_COMMUNITY): Payer: Self-pay | Admitting: *Deleted

## 2013-05-14 ENCOUNTER — Ambulatory Visit (HOSPITAL_COMMUNITY)
Admission: RE | Admit: 2013-05-14 | Discharge: 2013-05-15 | Disposition: A | Discharge status: Home / Self Care | Payer: Medicare Other | Source: Ambulatory Visit | Attending: Cardiovascular Disease | Admitting: Cardiovascular Disease

## 2013-05-14 DIAGNOSIS — F329 Major depressive disorder, single episode, unspecified: Secondary | ICD-10-CM | POA: Diagnosis not present

## 2013-05-14 DIAGNOSIS — I209 Angina pectoris, unspecified: Secondary | ICD-10-CM | POA: Insufficient documentation

## 2013-05-14 DIAGNOSIS — I200 Unstable angina: Secondary | ICD-10-CM

## 2013-05-14 DIAGNOSIS — Z7982 Long term (current) use of aspirin: Secondary | ICD-10-CM | POA: Insufficient documentation

## 2013-05-14 DIAGNOSIS — E079 Disorder of thyroid, unspecified: Secondary | ICD-10-CM | POA: Insufficient documentation

## 2013-05-14 DIAGNOSIS — Z87891 Personal history of nicotine dependence: Secondary | ICD-10-CM | POA: Diagnosis not present

## 2013-05-14 DIAGNOSIS — K219 Gastro-esophageal reflux disease without esophagitis: Secondary | ICD-10-CM | POA: Diagnosis not present

## 2013-05-14 DIAGNOSIS — I5032 Chronic diastolic (congestive) heart failure: Secondary | ICD-10-CM | POA: Diagnosis not present

## 2013-05-14 DIAGNOSIS — G478 Other sleep disorders: Secondary | ICD-10-CM | POA: Diagnosis not present

## 2013-05-14 DIAGNOSIS — F3289 Other specified depressive episodes: Secondary | ICD-10-CM | POA: Insufficient documentation

## 2013-05-14 DIAGNOSIS — F410 Panic disorder [episodic paroxysmal anxiety] without agoraphobia: Secondary | ICD-10-CM | POA: Insufficient documentation

## 2013-05-14 DIAGNOSIS — J4489 Other specified chronic obstructive pulmonary disease: Secondary | ICD-10-CM | POA: Insufficient documentation

## 2013-05-14 DIAGNOSIS — H269 Unspecified cataract: Secondary | ICD-10-CM | POA: Insufficient documentation

## 2013-05-14 DIAGNOSIS — E785 Hyperlipidemia, unspecified: Secondary | ICD-10-CM | POA: Insufficient documentation

## 2013-05-14 DIAGNOSIS — I252 Old myocardial infarction: Secondary | ICD-10-CM | POA: Diagnosis not present

## 2013-05-14 DIAGNOSIS — I10 Essential (primary) hypertension: Secondary | ICD-10-CM | POA: Diagnosis not present

## 2013-05-14 DIAGNOSIS — J449 Chronic obstructive pulmonary disease, unspecified: Secondary | ICD-10-CM | POA: Insufficient documentation

## 2013-05-14 DIAGNOSIS — I2510 Atherosclerotic heart disease of native coronary artery without angina pectoris: Secondary | ICD-10-CM | POA: Insufficient documentation

## 2013-05-14 DIAGNOSIS — I739 Peripheral vascular disease, unspecified: Secondary | ICD-10-CM | POA: Insufficient documentation

## 2013-05-14 DIAGNOSIS — R0602 Shortness of breath: Secondary | ICD-10-CM

## 2013-05-14 HISTORY — PX: LEFT HEART CATHETERIZATION WITH CORONARY ANGIOGRAM: SHX5451

## 2013-05-14 LAB — CBC
HCT: 35.7 % — ABNORMAL LOW (ref 36.0–46.0)
Hemoglobin: 12.3 g/dL (ref 12.0–15.0)
MCH: 31.5 pg (ref 26.0–34.0)
MCHC: 34.5 g/dL (ref 30.0–36.0)
MCV: 91.3 fL (ref 78.0–100.0)
Platelets: 259 10*3/uL (ref 150–400)
RBC: 3.91 MIL/uL (ref 3.87–5.11)
RDW: 14 % (ref 11.5–15.5)
WBC: 6.1 10*3/uL (ref 4.0–10.5)

## 2013-05-14 LAB — BASIC METABOLIC PANEL
BUN: 21 mg/dL (ref 6–23)
CHLORIDE: 104 meq/L (ref 96–112)
CO2: 22 mEq/L (ref 19–32)
Calcium: 9 mg/dL (ref 8.4–10.5)
Creatinine, Ser: 1.21 mg/dL — ABNORMAL HIGH (ref 0.50–1.10)
GFR calc non Af Amer: 42 mL/min — ABNORMAL LOW (ref >90)
GFR, EST AFRICAN AMERICAN: 48 mL/min — AB (ref >90)
Glucose, Bld: 130 mg/dL — ABNORMAL HIGH (ref 70–99)
POTASSIUM: 4.6 meq/L (ref 3.7–5.3)
Sodium: 143 mEq/L (ref 137–147)

## 2013-05-14 LAB — POCT ACTIVATED CLOTTING TIME: ACTIVATED CLOTTING TIME: 343 s

## 2013-05-14 LAB — PROTIME-INR
INR: 1.04 (ref 0.00–1.49)
Prothrombin Time: 13.4 seconds (ref 11.6–15.2)

## 2013-05-14 SURGERY — LEFT HEART CATHETERIZATION WITH CORONARY ANGIOGRAM
Anesthesia: LOCAL

## 2013-05-14 MED ORDER — MIDAZOLAM HCL 2 MG/2ML IJ SOLN
INTRAMUSCULAR | Status: AC
  Filled 2013-05-14: qty 2

## 2013-05-14 MED ORDER — HEPARIN (PORCINE) IN NACL 2-0.9 UNIT/ML-% IJ SOLN
INTRAMUSCULAR | Status: AC
  Filled 2013-05-14: qty 1500

## 2013-05-14 MED ORDER — RANOLAZINE ER 500 MG PO TB12
500.0000 mg | ORAL_TABLET | Freq: Two times a day (BID) | ORAL | Status: DC
  Administered 2013-05-14 – 2013-05-15 (×3): 500 mg via ORAL
  Filled 2013-05-14 (×4): qty 1

## 2013-05-14 MED ORDER — SODIUM CHLORIDE 0.9 % IV SOLN: 250.0000 mL | INTRAVENOUS | Status: DC | PRN

## 2013-05-14 MED ORDER — SODIUM CHLORIDE 0.9 % IV SOLN
1.0000 mL/kg/h | INTRAVENOUS | Status: AC
  Administered 2013-05-14: 1 mL/kg/h via INTRAVENOUS

## 2013-05-14 MED ORDER — NITROGLYCERIN 0.4 MG SL SUBL: 0.4000 mg | SUBLINGUAL_TABLET | SUBLINGUAL | Status: DC | PRN

## 2013-05-14 MED ORDER — SODIUM CHLORIDE 0.9 % IJ SOLN: 3.0000 mL | INTRAMUSCULAR | Status: DC | PRN

## 2013-05-14 MED ORDER — ADENOSINE 12 MG/4ML IV SOLN
16.0000 mL | Freq: Once | INTRAVENOUS | Status: DC
  Filled 2013-05-14: qty 16

## 2013-05-14 MED ORDER — VERAPAMIL HCL 2.5 MG/ML IV SOLN
INTRAVENOUS | Status: AC
  Filled 2013-05-14: qty 2

## 2013-05-14 MED ORDER — FUROSEMIDE 20 MG PO TABS
20.0000 mg | ORAL_TABLET | Freq: Every day | ORAL | Status: DC | PRN
  Filled 2013-05-14: qty 1

## 2013-05-14 MED ORDER — HEPARIN SODIUM (PORCINE) 1000 UNIT/ML IJ SOLN
INTRAMUSCULAR | Status: AC
  Filled 2013-05-14: qty 1

## 2013-05-14 MED ORDER — LIDOCAINE-EPINEPHRINE 1 %-1:100000 IJ SOLN
INTRAMUSCULAR | Status: AC
  Filled 2013-05-14: qty 1

## 2013-05-14 MED ORDER — ASPIRIN 81 MG PO CHEW
81.0000 mg | CHEWABLE_TABLET | Freq: Every day | ORAL | Status: DC
  Administered 2013-05-15: 10:00:00 81 mg via ORAL
  Filled 2013-05-14: qty 1

## 2013-05-14 MED ORDER — BIVALIRUDIN 250 MG IV SOLR
INTRAVENOUS | Status: AC
  Filled 2013-05-14: qty 250

## 2013-05-14 MED ORDER — DULOXETINE HCL 60 MG PO CPEP
60.0000 mg | ORAL_CAPSULE | Freq: Every day | ORAL | Status: DC
  Administered 2013-05-14: 60 mg via ORAL
  Filled 2013-05-14 (×2): qty 1

## 2013-05-14 MED ORDER — CARVEDILOL 12.5 MG PO TABS
12.5000 mg | ORAL_TABLET | Freq: Two times a day (BID) | ORAL | Status: DC
  Administered 2013-05-14 – 2013-05-15 (×3): 12.5 mg via ORAL
  Filled 2013-05-14 (×5): qty 1

## 2013-05-14 MED ORDER — PANTOPRAZOLE SODIUM 40 MG PO TBEC
40.0000 mg | DELAYED_RELEASE_TABLET | Freq: Every morning | ORAL | Status: DC
  Administered 2013-05-14: 40 mg via ORAL
  Filled 2013-05-14: qty 1

## 2013-05-14 MED ORDER — LIDOCAINE HCL (PF) 1 % IJ SOLN
INTRAMUSCULAR | Status: AC
  Filled 2013-05-14: qty 30

## 2013-05-14 MED ORDER — ONDANSETRON HCL 4 MG/2ML IJ SOLN: 4.0000 mg | Freq: Four times a day (QID) | INTRAMUSCULAR | Status: DC | PRN

## 2013-05-14 MED ORDER — CLOPIDOGREL BISULFATE 75 MG PO TABS
75.0000 mg | ORAL_TABLET | Freq: Every day | ORAL | Status: DC
  Administered 2013-05-15: 10:00:00 75 mg via ORAL
  Filled 2013-05-14: qty 1

## 2013-05-14 MED ORDER — ASPIRIN 81 MG PO CHEW
81.0000 mg | CHEWABLE_TABLET | ORAL | Status: AC
  Administered 2013-05-14: 81 mg via ORAL
  Filled 2013-05-14: qty 1

## 2013-05-14 MED ORDER — SODIUM CHLORIDE 0.9 % IJ SOLN: 3.0000 mL | Freq: Two times a day (BID) | INTRAMUSCULAR | Status: DC

## 2013-05-14 MED ORDER — FENTANYL CITRATE 0.05 MG/ML IJ SOLN
INTRAMUSCULAR | Status: AC
  Filled 2013-05-14: qty 2

## 2013-05-14 MED ORDER — ASPIRIN 81 MG PO CHEW
324.0000 mg | CHEWABLE_TABLET | Freq: Once | ORAL | Status: AC
  Administered 2013-05-14: 324 mg via ORAL
  Filled 2013-05-14: qty 4

## 2013-05-14 MED ORDER — ACETAMINOPHEN 500 MG PO TABS: 500.0000 mg | ORAL_TABLET | Freq: Three times a day (TID) | ORAL | Status: DC | PRN

## 2013-05-14 MED ORDER — ASPIRIN 81 MG PO TBEC: 81.0000 mg | DELAYED_RELEASE_TABLET | Freq: Every morning | ORAL | Status: DC

## 2013-05-14 MED ORDER — ACETAMINOPHEN 325 MG PO TABS
650.0000 mg | ORAL_TABLET | ORAL | Status: DC | PRN
  Administered 2013-05-14: 22:00:00 650 mg via ORAL
  Filled 2013-05-14: qty 2

## 2013-05-14 MED ORDER — NITROGLYCERIN 0.2 MG/ML ON CALL CATH LAB
INTRAVENOUS | Status: AC
  Filled 2013-05-14: qty 1

## 2013-05-14 MED ORDER — SODIUM CHLORIDE 0.9 % IV SOLN
INTRAVENOUS | Status: DC
  Administered 2013-05-14: 08:00:00 via INTRAVENOUS

## 2013-05-14 NOTE — CV Procedure (Signed)
Cardiac Catheterization Procedure Note  Name: [name] MRN: [id number] DOB: 1932/05/24  Procedure: Left Heart Cath, Selective Coronary Angiography,  pressure wire analysis/PTCA/Stent of the left circumflex  Indication: This is a 80 year old woman with known CAD. She's had moderate stenosis of the left circumflex and LAD. She has noted progressive symptoms of angina, now at low-level activity. Her symptoms are CCS class 3-4 despite maximal medical therapy. She was referred for cardiac catheterization and possible PCI.  Diagnostic Procedure Details: Initially, the right wrist was prepped, draped, and anesthetized with 1% lidocaine. Using ultrasound access, a 5/6 French sheath was introduced easily into the right radial artery. There was a radial loop present. I was able to navigate the loop with a coronary wire. However, a 5 [place] JR 4 catheter would not track around the loop. The patient had significant pain in her arm. I elected transitioned to the right groin. The right groin was prepped, draped, and anesthetized with 1% lidocaine. Using the modified Seldinger technique, a 5 French sheath was introduced into the right femoral artery. Standard Judkins catheters were used for selective coronary angiography.  Ventriculography was deferred. Catheter exchanges were performed over a wire.  The diagnostic procedure was well-tolerated without immediate complications.  PROCEDURAL FINDINGS Hemodynamics: AO 116/54 LV 115/13  Coronary angiography: Coronary dominance: right  Left mainstem: The left main is moderately calcified. The vessel has no significant obstructive disease. Divides into the LAD and left circumflex.  Left anterior descending (LAD): The LAD is moderately calcified. The vessel has 30-40% proximal stenosis. There is a shelf like plaque present. This is unchanged from the 2010 cardiac cath study. The first diagonal is patent without significant stenosis. The mid LAD is patent with  minor diffuse irregularity. The distal LAD is small in caliber.  Left circumflex (LCx): The left circumflex is a dominant vessel. Proximally there is mild to moderate calcification. There is a hypodense 70% stenosis before the origin of the first OM. There is diffuse 50% stenosis just proximal to the hypodense area involving the circumflex nearly back to its ostium. The mid vessel has a stable eccentric 50% stenosis. Distally the OM and PDA branches are patent  Right coronary artery (RCA): This is a small, nondominant vessel. There was 70% ostial stenosis. The mid vessel has 50% stenosis. There is moderate calcification present.  Left ventriculography: Deferred  PCI Procedure Note:  Following the diagnostic procedure, the decision was made to proceed with pressure wire analysis of the left circumflex. Her films were compared to her previous study. There is a new, moderate hypodense plaque in the proximal circumflex with 70% stenosis. There was no significant stenosis in this area on the previous study. There is a stable eccentric plaque in the mid circumflex that had not changed since the previous study. A 5 [place] guide catheter was utilized. Angiomax was used for anticoagulation.   A Verrata pressure wire was used to cross the lesion. The wire had been normalized at the guide catheter tip. Intravenous adenosine was administered. The result at peak hyperemia was 0.80. Considering her significant angina RA on maximal medical therapy, I elected to proceed with PCI. The lesion was predilated with a 2.5 balloon.  The lesion was then stented with a 3.0 x 24 mm Promus Premier drug-eluting stent.  The stent was postdilated with a 3.0 mm noncompliant balloon.  Following PCI, there was 0% residual stenosis and TIMI-3 flow. Final angiography confirmed an excellent result. Femoral hemostasis was achieved with a Perclose.  The patient tolerated the PCI procedure well. There were no immediate procedural complications.   The patient was transferred to the post catheterization recovery area for further monitoring.  PCI Data: Vessel - LCx/Segment - prox Percent Stenosis (pre)  70 TIMI-flow 3 Stent 3.0x24 mm Promus DES Percent Stenosis (post) 0 TIMI-flow (post) 3  Final Conclusions:   1. mild nonobstructive stenosis of the proximal LAD, stable from the previous study 2. Moderately severe stenosis of the proximal left circumflex with positive pressure wire analysis, treated successfully with PCI using a drug-eluting stent 3. Moderately severe stenosis of a small, nondominant RCA.  Recommendations: Continue aggressive medical therapy. The patient will need aspirin and Plavix for at least 12 months. She does have diffuse residual CAD in may continue to have some angina, but I would anticipate this would significantly improve her symptoms.  Sherren Mocha 05/14/2013, 12:13 PM

## 2013-05-14 NOTE — Interval H&P Note (Signed)
History and Physical Interval Note:  05/14/2013 9:26 AM  [name]  has presented today for surgery, with the diagnosis of Chest pain  The various methods of treatment have been discussed with the patient and family. After consideration of risks, benefits and other options for treatment, the patient has consented to  Procedure(s): LEFT HEART CATHETERIZATION WITH CORONARY ANGIOGRAM (N/A) as a surgical intervention .  The patient's history has been reviewed, patient examined, no change in status, stable for surgery.  I have reviewed the patient's chart and labs.  Questions were answered to the patient's satisfaction.    Pt has been on Ranexa and carvedilol, with persistent CCS Class 3-4 symptoms of angina. She is referred for cath and possible PCI. Sx's are very concerning for severe obstructive CAD. I have reviewed the note above and agree with findings and plan as outlined.  Cath Lab Visit (complete for each Cath Lab visit)  Clinical Evaluation Leading to the Procedure:   ACS: no  Non-ACS:    Anginal Classification: CCS III  Anti-ischemic medical therapy: Maximal Therapy (2 or more classes of medications)  Non-Invasive Test Results: No non-invasive testing performed  Prior CABG: No previous CABG       Sherren Mocha

## 2013-05-14 NOTE — H&P (View-Only) (Signed)
Patient ID: [name], female   DOB: 04-01-1933, 80 y.o.   MRN: [id number]      SUBJECTIVE: [name] has a h/o prior hospitalizations for chest pain and had moderate nonobstructive CAD as per coronary angiography in 09/2008, HTN, hyperlipidemia, and chronic diastolic heart failure, along with depression and severe panic attacks. She had a normal Dobutamine stress echo in April 2013.  She has severe shortness of breath, and has known bronchitis. She now has significant precordial chest pain radiating into her neck, left shoulder, and arm occurring on a daily basis. At her last visit, I thought that she may have developed a nitrate tolerance so nitrates were discontinued and I started her on Ranexa 500 mg twice daily. However, this has not alleviated her chest pain. Her blood pressure also remains elevated. She experiences chest pain with minimal activity as well as at rest. She was evaluated in the ED at [facility] in mid December 2014. Her ECG at that time showed normal sinus rhythm with a mild nonspecific ST segment abnormality. An ECG today performed in the office shows normal sinus rhythm, 67 beats per minute, with a nonspecific T wave abnormality in leads aVL and lead III.      Allergies  Allergen Reactions  . Phenothiazines Anaphylaxis and Hives  . Polysorbate Anaphylaxis  . Prednisone Shortness Of Breath  . Codeine Itching  . Statins Nausea And Vomiting       . Prochlorperazine Other (See Comments)    unknown  . Atorvastatin Hives  . Ofloxacin Rash  . Other Itching and Rash    "WOOL"= make skin look like it has been burned  . Penicillins Other (See Comments)    Causes redness all over.   . Pimozide Hives and Itching    Current Outpatient Prescriptions  Medication Sig Dispense Refill  . carvedilol (COREG) 6.25 MG tablet Take 1 tablet (6.25 mg total) by mouth 2 (two) times daily.  60 tablet  6  . ranolazine (RANEXA) 500 MG 12 hr tablet Take 1 tablet (500 mg  total) by mouth 2 (two) times daily.  60 tablet  6  . aspirin 81 MG EC tablet Take 81 mg by mouth every morning.       . DULoxetine (CYMBALTA) 60 MG capsule Take 60 mg by mouth at bedtime.       . furosemide (LASIX) 20 MG tablet Take 20 mg by mouth daily as needed for fluid.       . nitroGLYCERIN (NITROSTAT) 0.4 MG SL tablet Place 0.4 mg under the tongue every 5 (five) minutes as needed. For chest pain      . pantoprazole (PROTONIX) 40 MG tablet Take 40 mg by mouth every morning.       . polyethylene glycol (MIRALAX / GLYCOLAX) packet Take 17 g by mouth daily as needed. For constipation      . POTASSIUM GLUCONATE PO Take 500 mg by mouth every morning.      . traMADol (ULTRAM) 50 MG tablet Take 50 mg by mouth 2 (two) times daily as needed. For pain      . vitamin E 400 UNIT capsule Take 400 Units by mouth daily.       No current facility-administered medications for this visit.    Past Medical History  Diagnosis Date  . PVD (peripheral vascular disease)   . Other and unspecified hyperlipidemia   . Coronary atherosclerosis of native coronary artery     nonobstructive CAD by multiple  catheterizations  . Panic disorder without agoraphobia   . HTN (hypertension)     Hx of it  . MI (myocardial infarction) 2006  . Internal hemorrhoids without mention of complication   . Dysphagia, unspecified(787.20)   . Microscopic colitis 2003  . GERD (gastroesophageal reflux disease)     Hx Schatzki's ring, multiple EGD/ED last 01/06/2004  . Hyperlipemia   . Thyroid disease     recent abnl TSH per pt  . S/P colonoscopy 09/27/2001    internal hemorrhoids, desc colon inflam polyp, SB BX-chronic duodenitis, colitis  . Arthritis   . Shortness of breath   . Heart disease   . Bursitis     left shoulder  . Hyperlipidemia   . Sleep disorder     obstructive  . COPD (chronic obstructive pulmonary disease)   . Complication of anesthesia   . PONV (postoperative nausea and vomiting)     'a little nausea"    . Anxiety   . Depression   . Pneumonia 12/2011  . Heart murmur     'a littel'  . Cataract     Past Surgical History  Procedure Laterality Date  . Tonsillectomy    . Unspecified area, hysterectomy  1972    partial  . Cholecystectomy  1998  . Right knee replacement  2007  . Right leg benign tumor    . Breast lumpectomy  1998    left, benign  . Left hand surgery    . Left rotator cuff surgery    . Nasal sinus surgery    . Bladder tack  06/2010  . Cardiac catheterization    . Maloney dilation  03/16/2011    Gastritis. No H.pylori on bx. 82F maloney dilation with disruption of  occult cevical esophageal web  . Colonoscopy  03/16/2011    multiple hyperplastic colon polyps, sigmoid diverticulosis, melanosis coli  . Bladder suspension  11/09/2011    Procedure: TRANSVAGINAL TAPE (TVT) PROCEDURE;  Surgeon: Marissa Nestle, MD;  Location: [facility];  Service: Urology;  Laterality: N/A;  . Abdominal hysterectomy    . Appendectomy    . Carpal tunnel release  1989    left  . Anterior and posterior repair      with resection of vagina  . Cardiac catheterization    . Shoulder surgery Left   . Joint replacement Right 2007  . Lumbar laminectomy/decompression microdiscectomy N/A 10/11/2012    Procedure: LUMBAR LAMINECTOMY/DECOMPRESSION MICRODISCECTOMY 2 LEVELS;  Surgeon: Floyce Stakes, MD;  Location: [place] [facility];  Service: Neurosurgery;  Laterality: N/A;  L3-4 L4-5 Laminectomy    History   Social History  . Marital Status: Divorced    Spouse Name: N/A    Number of Children: 3  . Years of Education: N/A   Occupational History  . retired    Social History Main Topics  . Smoking status: Former Smoker -- 1.00 packs/day for 64 years    Types: Cigarettes    Quit date: 11/17/2001  . Smokeless tobacco: Never Used     Comment: no tobacco  . Alcohol Use: No  . Drug Use: No  . Sexual Activity: No   Other Topics Concern  . Not on file   Social History Narrative   Divorced.    Sister had colon perforation & died from complications in [place], [place] Vitals:   05/08/13 1410  Height: 5\' 1"  (1.549 m)  Weight: 198 lb (89.812 kg)   BP 165/82 Pulse 71  PHYSICAL EXAM General: NAD Neck: No JVD, no thyromegaly or thyroid nodule.  Lungs: Clear to auscultation bilaterally with normal respiratory effort. CV: Nondisplaced PMI.  Heart regular S1/S2, no S3/S4, no murmur.  No peripheral edema.  No carotid bruit.  Normal pedal pulses.  Abdomen: Soft, nontender, no hepatosplenomegaly, no distention.  Neurologic: Alert and oriented x 3.  Psych: Normal affect. Extremities: No clubbing or cyanosis.   ECG: reviewed and available in electronic records.  Coronary angiography (10-14-2008): Left main coronary artery was calcified  proximally. There was a 30% discrete stenosis.  The LAD was also calcified in its proximal mid vessel. There was 50-60%  proximal disease. It was a small vessel distally. First diagonal  branch was normal. Circumflex coronary artery was large and left  dominant. There was a 30% proximal lesion. There was a 60% lesion in  the midvessel after the first obtuse marginal branch. The left-sided  PDA had a 40% midvessel lesion.  The right coronary artery was small and nondominant with 40-50% disease  in the proximal and midvessel.    ASSESSMENT AND PLAN: 1. Chest pain/angina: Her symptoms represent CCS class III-IV angina. I am concerned that her proximal LAD stenosis of 50-60% noted in June 2010 may have progressed. She may also have had progression of the 60% lesion noted in the left circumflex coronary artery. Normal Dobutamine stress echo in April 2013. I will arrange for left heart catheterization with coronary angiography within the next 7 days. Continue Ranexa 500 mg bid for time being. GFR most recently 47 ml/min. Will hold Lasix until after cath. 2. Chronic diastolic HF: she takes her Lasix three times a week due to feeling weak afterwards,  and it makes her cramp, but does take her potassium daily. Will increase Coreg to 12.5 mg bid.  3. HTN: I will increase Coreg to 12.5 mg bid for more optimal control. I've asked her to check her BP 3-4 times a week and to report these values at her next visit. 4. Hyperlipidemia: will check lipids if not done recently.    Dispo: f/u after cath.    Kate Sable, M.D., F.A.C.C.

## 2013-05-14 NOTE — Research (Signed)
AVERT Informed Consent   Subject Name: [name]  Subject met inclusion and exclusion criteria.  The informed consent form, study requirements and expectations were reviewed with the subject and questions and concerns were addressed prior to the signing of the consent form.  The subject verbalized understanding of the trail requirements.  The subject agreed to participate in the AVERT trial and signed the informed consent.  The informed consent was obtained prior to performance of any protocol-specific procedures for the subject.  A copy of the signed informed consent was given to the subject and a copy was placed in the subject's medical record.  Sandie Ano 05/14/2013, 08:05

## 2013-05-15 DIAGNOSIS — I2510 Atherosclerotic heart disease of native coronary artery without angina pectoris: Secondary | ICD-10-CM | POA: Diagnosis not present

## 2013-05-15 DIAGNOSIS — I209 Angina pectoris, unspecified: Secondary | ICD-10-CM | POA: Diagnosis not present

## 2013-05-15 DIAGNOSIS — K219 Gastro-esophageal reflux disease without esophagitis: Secondary | ICD-10-CM | POA: Diagnosis not present

## 2013-05-15 DIAGNOSIS — I739 Peripheral vascular disease, unspecified: Secondary | ICD-10-CM | POA: Diagnosis not present

## 2013-05-15 DIAGNOSIS — I10 Essential (primary) hypertension: Secondary | ICD-10-CM | POA: Diagnosis not present

## 2013-05-15 DIAGNOSIS — I200 Unstable angina: Secondary | ICD-10-CM

## 2013-05-15 DIAGNOSIS — R0602 Shortness of breath: Secondary | ICD-10-CM | POA: Diagnosis not present

## 2013-05-15 LAB — CBC
HCT: 32.6 % — ABNORMAL LOW (ref 36.0–46.0)
Hemoglobin: 11.3 g/dL — ABNORMAL LOW (ref 12.0–15.0)
MCH: 31.7 pg (ref 26.0–34.0)
MCHC: 34.7 g/dL (ref 30.0–36.0)
MCV: 91.6 fL (ref 78.0–100.0)
PLATELETS: 236 10*3/uL (ref 150–400)
RBC: 3.56 MIL/uL — ABNORMAL LOW (ref 3.87–5.11)
RDW: 14.1 % (ref 11.5–15.5)
WBC: 6.9 10*3/uL (ref 4.0–10.5)

## 2013-05-15 LAB — BASIC METABOLIC PANEL
BUN: 15 mg/dL (ref 6–23)
CO2: 24 mEq/L (ref 19–32)
Calcium: 8.5 mg/dL (ref 8.4–10.5)
Chloride: 101 mEq/L (ref 96–112)
Creatinine, Ser: 1.17 mg/dL — ABNORMAL HIGH (ref 0.50–1.10)
GFR, EST AFRICAN AMERICAN: 50 mL/min — AB (ref >90)
GFR, EST NON AFRICAN AMERICAN: 43 mL/min — AB (ref >90)
Glucose, Bld: 106 mg/dL — ABNORMAL HIGH (ref 70–99)
Potassium: 3.9 mEq/L (ref 3.7–5.3)
SODIUM: 137 meq/L (ref 137–147)

## 2013-05-15 MED ORDER — CLOPIDOGREL BISULFATE 75 MG PO TABS: 75.0000 mg | ORAL_TABLET | Freq: Every day | ORAL | Status: DC

## 2013-05-15 MED FILL — Sodium Chloride IV Soln 0.9%: INTRAVENOUS | Qty: 50 | Status: AC

## 2013-05-15 NOTE — Progress Notes (Signed)
CARDIAC REHAB PHASE I   PRE:  Rate/Rhythm: 71 SR    BP: sitting 140/59    SaO2: 98 2L, 97 RA  MODE:  Ambulation: 300 ft   POST:  Rate/Rhythm: 94 SR    BP: sitting 174/67     SaO2: 95 RA  Pt steady. Sts she is less SOB than she was but she had to stop and catch her breath after 100 ft. Sts she feels her SOB in her windpipe and that it may be related to her chronic bronchitis. Encouraged her to discuss with PCP. Pt sts she takes nothing for lung disease. SaO2 good. Discussed ed with pt. Did not give ex gl or CRPII but encouraged pt to walk as tolerated.  [id number]  [name] [place] CES, ACSM 05/15/2013 8:56 AM

## 2013-05-15 NOTE — Discharge Summary (Signed)
Physician Discharge Summary  Patient ID: [name] MRN: [id number] DOB/AGE: 1932-11-28 80 y.o.  Admit date: 05/14/2013 Discharge date: 05/15/2013  Admission Diagnoses: Angina  Discharge Diagnoses:  Principal Problem:   Other and unspecified angina pectoris Active Problems:   CAD, NATIVE VESSEL - PCI + DES to left circumflex 05/14/13   Discharged Condition: stable  Hospital Course: The patient is a 80 y/o female with known CAD. She's had moderate stenosis of the left circumflex and LAD. She has noted progressive symptoms of angina, now at low-level activity. Her symptoms are CCS class 3-4 despite maximal medical therapy. She was referred for cardiac catheterization and possible PCI.  She presented to the [facility] on [facility]/14/13 to undergo selective coronary angiography. The procedure was performed by Dr. Burt Knack via the right radial artery. She was found to have mild nonobstructive stenosis of the proximal LAD, stable from the previous study, moderately severe stenosis of the proximal left circumflex with positive pressure wire analysis, treated successfully with PCI using a drug-eluting stent. She was also noted to have moderately severe stenosis of a small, nondominant RCA. Left ventriculography was deferred. She left the cath lab in stable condition. She was continued on DAPT with ASA + Plavix. She was also resumed on Coreg and Ranexa. She had no post cath complications and no further chest discomfort. She ambulated with cardiac rehab w/o chest pain. She had mild SOB, but states she has DOE at baseline. The right radial access site remained stable. HR , BP and renal function remained stable. She was last seen and examined by Dr. Ellyn Hack, who determined she was stable for discharge home. She will follow-up with Dr. Bronson Ing.   Consults: None  Significant Diagnostic Studies:   LHC 05/14/13 PCI Data:  Vessel - LCx/Segment - prox  Percent Stenosis (pre) 70  TIMI-flow 3  Stent 3.0x24 mm Promus  DES  Percent Stenosis (post) 0  TIMI-flow (post) 3  Final Conclusions:  1. mild nonobstructive stenosis of the proximal LAD, stable from the previous study  2. Moderately severe stenosis of the proximal left circumflex with positive pressure wire analysis, treated successfully with PCI using a drug-eluting stent  3. Moderately severe stenosis of a small, nondominant RCA.   Treatments: See Hospital Course  Discharge Exam: Blood pressure 141/50, pulse 73, temperature 97.7 F (36.5 C), temperature source Oral, resp. rate 20, height 5\' 1"  (1.549 m), weight 197 lb 15.6 oz (89.8 kg), SpO2 99.00%.   Disposition: 01-Home or Self Care     Medication List    ASK your doctor about these medications       acetaminophen 500 MG tablet  Commonly known as:  TYLENOL  Take 500 mg by mouth every 8 (eight) hours as needed for moderate pain.     aspirin 81 MG EC tablet  Take 81 mg by mouth every morning.     carvedilol 12.5 MG tablet  Commonly known as:  COREG  Take 1 tablet (12.5 mg total) by mouth 2 (two) times daily.     DULoxetine 60 MG capsule  Commonly known as:  CYMBALTA  Take 60 mg by mouth at bedtime.     furosemide 20 MG tablet  Commonly known as:  LASIX  Take 20 mg by mouth daily as needed for fluid.     HAIR/SKIN/NAILS Tabs  Take 1 tablet by mouth daily.     nitroGLYCERIN 0.4 MG SL tablet  Commonly known as:  NITROSTAT  Place 0.4 mg under the tongue every 5 (  five) minutes as needed. For chest pain     pantoprazole 40 MG tablet  Commonly known as:  PROTONIX  Take 40 mg by mouth every morning.     polyethylene glycol packet  Commonly known as:  MIRALAX / GLYCOLAX  Take 17 g by mouth daily as needed. For constipation     POTASSIUM GLUCONATE PO  Take 500 mg by mouth every morning.     ranolazine 500 MG 12 hr tablet  Commonly known as:  RANEXA  Take 1 tablet (500 mg total) by mouth 2 (two) times daily.       TIME SPENT ON DISCHARGE, INCLUDING PHYSICIAN TIME: >30  MINUTES  Signed: Keora Eccleston 05/15/2013, 8:10 AM

## 2013-05-15 NOTE — Discharge Instructions (Signed)
Angiography, Care After °Refer to this sheet in the next few weeks. These instructions provide you with information on caring for yourself after your procedure. Your health care provider may also give you more specific instructions. Your treatment has been planned according to current medical practices, but problems sometimes occur. Call your health care provider if you have any problems or questions after your procedure.  °WHAT TO EXPECT AFTER THE PROCEDURE °After your procedure, it is typical to have the following sensations: °· Minor discomfort or tenderness and a small bump at the catheter insertion site. The bump should usually decrease in size and tenderness within 1 to 2 weeks. °· Any bruising will usually fade within 2 to 4 weeks. °HOME CARE INSTRUCTIONS  °· You may need to keep taking blood thinners if they were prescribed for you. Only take over-the-counter or prescription medicines for pain, fever, or discomfort as directed by your health care provider. °· Do not apply powder or lotion to the site. °· Do not sit in a bathtub, swimming pool, or whirlpool for 5 to 7 days. °· You may shower 24 hours after the procedure. Remove the bandage (dressing) and gently wash the site with plain soap and water. Gently pat the site dry. °· Inspect the site at least twice daily. °· Limit your activity for the first 48 hours. Do not bend, squat, or lift anything over 20 lb (9 kg) or as directed by your health care provider. °· Do not drive home if you are discharged the day of the procedure. Have someone else drive you. Follow instructions about when you can drive or return to work. °SEEK MEDICAL CARE IF: °· You get lightheaded when standing up. °· You have drainage (other than a small amount of blood on the dressing). °· You have chills. °· You have a fever. °· You have redness, warmth, swelling, or pain at the insertion site. °SEEK IMMEDIATE MEDICAL CARE IF:  °· You develop chest pain or shortness of breath, feel faint,  or pass out. °· You have bleeding, swelling larger than a walnut, or drainage from the catheter insertion site. °· You develop pain, discoloration, coldness, or severe bruising in the leg or arm that held the catheter. °· You develop bleeding from any other place, such as the bowels. You may see bright red blood in your urine or stools, or your stools may appear black and tarry. °· You have heavy bleeding from the site. If this happens, hold pressure on the site. °MAKE SURE YOU: °· Understand these instructions. °· Will watch your condition. °· Will get help right away if you are not doing well or get worse. °Document Released: 10/20/2004 Document Revised: 12/04/2012 Document Reviewed: 08/26/2012 °ExitCare® Patient Information ©2014 [employer]. ° °

## 2013-05-15 NOTE — Progress Notes (Signed)
Subjective: No resting chest pain. Hasn't ambulated yet.   Objective: Vital signs in last 24 hours: Temp:  [97.4 F (36.3 C)-98.6 F (37 C)] 98 F (36.7 C) (01/29 0453) Pulse Rate:  [57-75] 66 (01/29 0453) Resp:  [15-20] 20 (01/29 0453) BP: (120-178)/(23-83) 120/47 mmHg (01/29 0453) SpO2:  [92 %-100 %] 100 % (01/29 0453) Weight:  [197 lb 15.6 oz (89.8 kg)-198 lb (89.812 kg)] 197 lb 15.6 oz (89.8 kg) (01/29 0055) Last BM Date: 05/13/13  Intake/Output from previous day: 01/28 0701 - 01/29 0700 In: 801.3 [P.O.:240; I.V.:561.3] Out: -  Intake/Output this shift: Total I/O In: 89.8 [I.V.:89.8] Out: -   Medications Current Facility-Administered Medications  Medication Dose Route Frequency Provider Last Rate Last Dose  . 0.9 %  sodium chloride infusion  250 mL Intravenous PRN Sherren Mocha, MD      . acetaminophen (TYLENOL) tablet 650 mg  650 mg Oral Q4H PRN Sherren Mocha, MD   650 mg at 05/14/13 2151  . aspirin chewable tablet 81 mg  81 mg Oral Daily Sherren Mocha, MD      . carvedilol (COREG) tablet 12.5 mg  12.5 mg Oral BID Sherren Mocha, MD   12.5 mg at 05/14/13 2152  . clopidogrel (PLAVIX) tablet 75 mg  75 mg Oral Q breakfast Sherren Mocha, MD      . DULoxetine (CYMBALTA) DR capsule 60 mg  60 mg Oral QHS Sherren Mocha, MD   60 mg at 05/14/13 2152  . furosemide (LASIX) tablet 20 mg  20 mg Oral Daily PRN Sherren Mocha, MD      . nitroGLYCERIN (NITROSTAT) SL tablet 0.4 mg  0.4 mg Sublingual Q5 min PRN Sherren Mocha, MD      . ondansetron [facility]) injection 4 mg  4 mg Intravenous Q6H PRN Sherren Mocha, MD      . pantoprazole (PROTONIX) EC tablet 40 mg  40 mg Oral q morning - 10a Sherren Mocha, MD   40 mg at 05/14/13 1700  . ranolazine (RANEXA) 12 hr tablet 500 mg  500 mg Oral BID Sherren Mocha, MD   500 mg at 05/14/13 2152  . sodium chloride 0.9 % injection 3 mL  3 mL Intravenous Q12H Sherren Mocha, MD      . sodium chloride 0.9 % injection 3 mL  3 mL Intravenous  PRN Sherren Mocha, MD        PE: General appearance: alert, cooperative and no distress Lungs: clear to auscultation bilaterally Heart: regular rate and rhythm Extremities: no LEE Pulses: 2+ and symmetric Skin: warm and dry Neurologic: Grossly normal  Lab Results:   Recent Labs  05/14/13 0825 05/15/13 0200  WBC 6.1 6.9  HGB 12.3 11.3*  HCT 35.7* 32.6*  PLT 259 236   BMET  Recent Labs  05/14/13 0825 05/15/13 0200  NA 143 137  K 4.6 3.9  CL 104 101  CO2 22 24  GLUCOSE 130* 106*  BUN 21 15  CREATININE 1.21* 1.17*  CALCIUM 9.0 8.5   PT/INR  Recent Labs  05/14/13 0825  LABPROT 13.4  INR 1.04    Studies/Results:  LHC 05/14/13 Final Conclusions:  1. mild nonobstructive stenosis of the proximal LAD, stable from the previous study  2. Moderately severe stenosis of the proximal left circumflex with positive pressure wire analysis, treated successfully with PCI using a drug-eluting stent  3. Moderately severe stenosis of a small, nondominant RCA.   Assessment/Plan  Active Problems:   Other and unspecified angina pectoris  Plan: Day 1 s/p LHC revealing mild nonobstructive stenosis of the proximal LAD (stable from previous study), moderately severe stenosis of a small, nondominant RCA and moderately severe stenosis of the proximal left circumflex, treated with PCI utilizing a DES. On DAPT with ASA + Plavix. Also on Coreg and Ranexa. She denies any resting chest pain. Will have her ambulate with cardiac rehab before discharge. HR and BP both stable. Renal function stable. Plan for discharge home later today. F/u with Dr. Burt Knack.     LOS: 1 day    [name] 05/15/2013 6:17 AM  I have seen and evaluated the patient this AM along with Ellen Henri, PA. I agree with her findings, examination as well as impression recommendations.  Elective cath for CCS 3-4 Angina --> PCI to Cx lesion from radial approach.  NO events o/n.  R radial site c/d/i  with normal reverse Allen's.  Anticipate d/c after ambulation today.   ROV in [place] with Dr. Bronson Ing.  On Ranexa, Coreg & ASA/Plavix as DAPT. ? Statin as OP  HARDING,DAVID W, M.D., M.S. [facility] [address] [place], [place]  [id number]  [phone] Pager # [phone] 05/15/2013 8:21 AM

## 2013-05-29 ENCOUNTER — Encounter: Payer: Self-pay | Admitting: Adult Health

## 2013-05-29 ENCOUNTER — Ambulatory Visit (INDEPENDENT_AMBULATORY_CARE_PROVIDER_SITE_OTHER): Payer: Medicare Other | Admitting: Adult Health

## 2013-05-29 VITALS — BP 147/56 | HR 67 | Ht 61.0 in | Wt 196.0 lb

## 2013-05-29 DIAGNOSIS — R0602 Shortness of breath: Secondary | ICD-10-CM

## 2013-05-29 DIAGNOSIS — I2510 Atherosclerotic heart disease of native coronary artery without angina pectoris: Secondary | ICD-10-CM | POA: Diagnosis not present

## 2013-05-29 DIAGNOSIS — I5032 Chronic diastolic (congestive) heart failure: Secondary | ICD-10-CM | POA: Diagnosis not present

## 2013-05-29 MED ORDER — NITROGLYCERIN 0.4 MG SL SUBL: 0.4000 mg | SUBLINGUAL_TABLET | SUBLINGUAL | Status: DC | PRN

## 2013-05-29 NOTE — Patient Instructions (Signed)
Your physician recommends that you schedule a follow-up appointment in: April with Dr Bronson Ing in [place] office.  We refilled your nitroglycerin. Please pick up at pharmacy.

## 2013-05-29 NOTE — Progress Notes (Deleted)
Name: [name]    DOB: [birth year]  Age: 80 y.o.  MR#: [id number]       PCP:  [name]      Insurance: Payor: MEDICARE / Plan: MEDICARE PART A AND B / Product Type: *No Product type* /   CC:    Chief Complaint  Patient presents with  . Coronary Artery Disease    PCI to Left Cx 2015    VS Filed Vitals:   05/29/13 1320  BP: 147/56  Pulse: 67  Height: 5\' 1"  (1.549 m)  Weight: 196 lb (88.905 kg)    Weights Current Weight  05/29/13 196 lb (88.905 kg)  05/15/13 197 lb 15.6 oz (89.8 kg)  05/15/13 197 lb 15.6 oz (89.8 kg)    Blood Pressure  BP Readings from Last 3 Encounters:  05/29/13 147/56  05/15/13 141/50  05/15/13 141/50     Admit date:  (Not on file) Last encounter with RMR:  Visit date not found   Allergy Phenothiazines; Polysorbate; Prednisone; Codeine; Statins; Prochlorperazine; Atorvastatin; Ofloxacin; Other; Penicillins; and Pimozide  Current Outpatient Prescriptions  Medication Sig Dispense Refill  . acetaminophen (TYLENOL) 500 MG tablet Take 500 mg by mouth every 8 (eight) hours as needed for moderate pain.      [name] Kitchen aspirin 81 MG EC tablet Take 81 mg by mouth every morning.       . carvedilol (COREG) 12.5 MG tablet Take 1 tablet (12.5 mg total) by mouth 2 (two) times daily.  60 tablet  6  . clopidogrel (PLAVIX) 75 MG tablet Take 1 tablet (75 mg total) by mouth daily with breakfast.  30 tablet  5  . DULoxetine (CYMBALTA) 60 MG capsule Take 60 mg by mouth at bedtime.       . furosemide (LASIX) 20 MG tablet Take 20 mg by mouth daily as needed for fluid.       . Multiple Vitamins-Minerals (HAIR/SKIN/NAILS) TABS Take 1 tablet by mouth daily.      . nitroGLYCERIN (NITROSTAT) 0.4 MG SL tablet Place 0.4 mg under the tongue every 5 (five) minutes as needed. For chest pain      . pantoprazole (PROTONIX) 40 MG tablet Take 40 mg by mouth every morning.       . polyethylene glycol (MIRALAX / GLYCOLAX) packet Take 17 g by mouth daily as needed. For constipation       . POTASSIUM GLUCONATE PO Take 500 mg by mouth every morning.      . ranolazine (RANEXA) 500 MG 12 hr tablet Take 1 tablet (500 mg total) by mouth 2 (two) times daily.  60 tablet  6   No current facility-administered medications for this visit.    Discontinued Meds:   There are no discontinued medications.  Patient Active Problem List   Diagnosis Date Noted  . Other and unspecified angina pectoris 05/14/2013  . Hematochezia 02/13/2011  . Abdominal pain 02/13/2011  . Major depression 09/28/2010  . FATIGUE 04/13/2009  . CHRONIC DIASTOLIC HEART FAILURE [id number]  . DYSPNEA 11/30/2008  . HYPERLIPIDEMIA-MIXED 11/27/2008  . CAD, NATIVE VESSEL - PCI + DES to left circumflex 05/14/13 11/27/2008  . PVD 11/27/2008  . PANIC ATTACK 02/28/2008  . MI 02/28/2008  . HEMORRHOIDS, INTERNAL 02/28/2008  . GASTROESOPHAGEAL REFLUX DISEASE, CHRONIC 02/28/2008  . COLITIS 02/28/2008  . DYSPHAGIA UNSPECIFIED 02/28/2008  . HYPERTENSION, HX OF 02/28/2008    LABS    Component Value Date/Time   NA 137 05/15/2013 0200   NA 143 05/14/2013 0825  NA 136 04/01/2013 1420   K 3.9 05/15/2013 0200   K 4.6 05/14/2013 0825   K 3.9 04/01/2013 1420   CL 101 05/15/2013 0200   CL 104 05/14/2013 0825   CL 100 04/01/2013 1420   CO2 24 05/15/2013 0200   CO2 22 05/14/2013 0825   CO2 24 04/01/2013 1420   GLUCOSE 106* 05/15/2013 0200   GLUCOSE 130* 05/14/2013 0825   GLUCOSE 128* 04/01/2013 1420   BUN 15 05/15/2013 0200   BUN 21 05/14/2013 0825   BUN 17 04/01/2013 1420   CREATININE 1.17* 05/15/2013 0200   CREATININE 1.21* 05/14/2013 0825   CREATININE 1.09 04/01/2013 1420   CREATININE 1.17* 02/13/2011 1526   CALCIUM 8.5 05/15/2013 0200   CALCIUM 9.0 05/14/2013 0825   CALCIUM 9.5 04/01/2013 1420   GFRNONAA 43* 05/15/2013 0200   GFRNONAA 42* 05/14/2013 0825   GFRNONAA 47* 04/01/2013 1420   GFRAA 50* 05/15/2013 0200   GFRAA 48* 05/14/2013 0825   GFRAA 55* 04/01/2013 1420   CMP     Component Value Date/Time   NA 137  05/15/2013 0200   K 3.9 05/15/2013 0200   CL 101 05/15/2013 0200   CO2 24 05/15/2013 0200   GLUCOSE 106* 05/15/2013 0200   BUN 15 05/15/2013 0200   CREATININE 1.17* 05/15/2013 0200   CREATININE 1.17* 02/13/2011 1526   CALCIUM 8.5 05/15/2013 0200   PROT 7.2 02/13/2011 1526   ALBUMIN 4.3 02/13/2011 1526   AST 18 02/13/2011 1526   ALT 13 02/13/2011 1526   ALKPHOS 54 02/13/2011 1526   BILITOT 0.3 02/13/2011 1526   GFRNONAA 43* 05/15/2013 0200   GFRAA 50* 05/15/2013 0200       Component Value Date/Time   WBC 6.9 05/15/2013 0200   WBC 6.1 05/14/2013 0825   WBC 6.0 04/01/2013 1420   HGB 11.3* 05/15/2013 0200   HGB 12.3 05/14/2013 0825   HGB 12.9 04/01/2013 1420   HCT 32.6* 05/15/2013 0200   HCT 35.7* 05/14/2013 0825   HCT 37.0 04/01/2013 1420   MCV 91.6 05/15/2013 0200   MCV 91.3 05/14/2013 0825   MCV 90.2 04/01/2013 1420    Lipid Panel     Component Value Date/Time   CHOL  Value: 187        ATP III CLASSIFICATION:  <200     mg/dL   Desirable  200-239  mg/dL   Borderline High  >=240    mg/dL   High        10/14/2008 0225   TRIG 147 10/14/2008 0225   HDL 40 10/14/2008 0225   CHOLHDL 4.7 10/14/2008 0225   VLDL 29 10/14/2008 0225   LDLCALC  Value: 118        Total Cholesterol/HDL:CHD Risk Coronary Heart Disease Risk Table                     Men   Women  1/2 Average Risk   3.4   3.3  Average Risk       5.0   4.4  2 X Average Risk   9.6   7.1  3 X Average Risk  23.4   11.0        Use the calculated Patient Ratio above and the CHD Risk Table to determine the patient's CHD Risk.        ATP III CLASSIFICATION (LDL):  <100     mg/dL   Optimal  100-129  mg/dL   Near or Above  Optimal  130-159  mg/dL   Borderline  160-189  mg/dL   High  >190     mg/dL   Very High* 10/14/2008 0225    ABG No results found for this basename: phart, pco2, pco2art, po2, po2art, hco3, tco2, acidbasedef, o2sat     Lab Results  Component Value Date   TSH 1.978 ***Test methodology is 3rd generation TSH****  01/25/2007   BNP (last 3 results) No results found for this basename: PROBNP,  in the last 8760 hours Cardiac Panel (last 3 results) No results found for this basename: CKTOTAL, CKMB, TROPONINI, RELINDX,  in the last 72 hours  Iron/TIBC/Ferritin No results found for this basename: iron, tibc, ferritin     EKG Orders placed during the hospital encounter of 05/14/13  . EKG 12-LEAD  . EKG 12-LEAD  . EKG 12-LEAD  . EKG 12-LEAD  . EKG 12-LEAD  . EKG 12-LEAD  . EKG 12-LEAD  . EKG 12-LEAD  . EKG 12-LEAD  . EKG 12-LEAD  . EKG     Prior Assessment and Plan Problem List as of 05/29/2013     Cardiovascular and Mediastinum   MI   CAD, NATIVE VESSEL - PCI + DES to left circumflex 05/14/13   Last Assessment & Plan   08/16/2011 Office Visit Written 08/16/2011  2:55 PM by Donney Dice, PA     No further workup indicated. Patient recently hospitalized with atypical CP, with normal cardiac markers. We referred her for outpatient dobutamine stress echocardiogram, which was normal. Therefore, aggressive secondary prevention measures recommended, and we will have patient return to Dr. Dannielle Burn in one year.    CHRONIC DIASTOLIC HEART FAILURE   Last Assessment & Plan   08/16/2011 Office Visit Written 08/16/2011  2:58 PM by Donney Dice, PA     Euvolemic by history, current PE, and CXR during recent hospitalization.    PVD   Last Assessment & Plan   09/28/2010 Office Visit Written 09/28/2010  1:58 PM by Ezra Sites, MD     Patient reports no claudication.    HEMORRHOIDS, INTERNAL   Other and unspecified angina pectoris     Digestive   GASTROESOPHAGEAL REFLUX DISEASE, CHRONIC   Last Assessment & Plan   04/20/2011 Office Visit Written 04/20/2011  4:13 PM by Mahala Menghini, PA     Well controlled.    COLITIS   DYSPHAGIA UNSPECIFIED   Last Assessment & Plan   02/13/2011 Office Visit Written 02/14/2011  7:59 PM by Andria Meuse, NP     Hx esophageal dysphagia w/ hx Schatzki's ring & esophageal  motility disorder. May require repeat esophageal dilation.      Other   HYPERLIPIDEMIA-MIXED   Last Assessment & Plan   08/16/2011 Office Visit Written 08/16/2011  2:57 PM by Donney Dice, PA     Patient  has documented intolerance to statins, most recently Crestor. Followup labs drawn yesterday in Dr. Edythe Lynn office, to whom we will defer future monitoring/management. Aggressive  treatment recommended, however, with target LDL 70 or less, if feasible.     PANIC ATTACK   FATIGUE   DYSPNEA   HYPERTENSION, HX OF   Last Assessment & Plan   08/16/2011 Office Visit Written 08/16/2011  2:56 PM by Donney Dice, PA     Will defer to Dr. Edythe Lynn office for ongoing monitoring/management.    Major depression   Last Assessment & Plan   09/28/2010 Office Visit Written 09/28/2010  1:59 PM  by Ezra Sites, MD     The patient's depression is quite severe she has been on Cymbalta and takes alprazolam for her anxiety attacks. She is more depressed and has significant insomnia. I added trazodone 50 mg by mouth each bedtimeboth as a sleeping aid as well as augmentation therapy for her depression.    Hematochezia   Last Assessment & Plan   02/13/2011 Office Visit Edited 02/14/2011  8:03 PM by Andria Meuse, NP     This is concerning.  Will most likely need colonoscopy to determine etiology since last colonoscopy over 3 yrs ago after CT for further evaluation.    Abdominal pain   Last Assessment & Plan   05/30/2011 Office Visit Written 05/30/2011  3:44 PM by Daneil Dolin, MD     One-year history of right lower quadrant abdominal pain. Pain unrelenting and insidiously worsening. It has a positional component. I doubt this is radicular pain. Likewise, I doubt this is musculoskeletal pain. It sounds visceral to me. Wonder about adhesions or an occult process i.e. spigelian hernia. She's had a thorough workup to date.  Recommendations: Proceed with a surgery consultation for consideration of diagnostic  laparoscopy with for lyses of adhesions, etc. I've taken the liberty to get her scheduled see Dr. Aviva Signs as her former general surgeon, Dr. Lindalou Hose, is no longer in the area.        Imaging: No results found.

## 2013-05-29 NOTE — Assessment & Plan Note (Signed)
This is unchanged after cardiac cath and stent placement.  She is deconditioned from several medical issues which I believe are contributing to her overall breathing status. O2 sat was completed in the office after walking in, and was 99%.

## 2013-05-29 NOTE — Assessment & Plan Note (Signed)
No evidence of fluid retention by exam. She is stable on her weight.

## 2013-05-29 NOTE — Progress Notes (Signed)
HPI: [name] is a 80 year old patient of Dr. Pennelope Bracken, we are following for ongoing assessment and management of CAD. She had recent admission to [facility] hospital on 05/14/2013 for cardiac catheterization in the setting of accelerated angina.   Cardiac catheterization was completed, demonstrating mild nonobstructive stenosis of the proximal LAD, stable from the previous study, with moderately severe stenosis of the proximal left circumflex with positive pressure wire analysis, which was treated successfully with PCI using a drug-eluting stent. She was also noted to have moderately severe stenosis of a small, nondominant RCA.   She was treated with dual antiplatelet therapy with aspirin and Plavix. She was resumed on Coreg and Ranexa.  She is here for post procedure followup. She comes today with multiple somatic complaints, family issues, ongoing fatigue and DOE. She denies recurrent chest pain or bleeding issues on Plavix.  Allergies  Allergen Reactions  . Phenothiazines Anaphylaxis and Hives  . Polysorbate Anaphylaxis  . Prednisone Shortness Of Breath  . Codeine Itching  . Statins Nausea And Vomiting       . Prochlorperazine Other (See Comments)    unknown  . Atorvastatin Hives  . Ofloxacin Rash  . Other Itching and Rash    "WOOL"= make skin look like it has been burned  . Penicillins Other (See Comments)    Causes redness all over.   . Pimozide Hives and Itching    Current Outpatient Prescriptions  Medication Sig Dispense Refill  . acetaminophen (TYLENOL) 500 MG tablet Take 500 mg by mouth every 8 (eight) hours as needed for moderate pain.      [name] Kitchen aspirin 81 MG EC tablet Take 81 mg by mouth every morning.       . carvedilol (COREG) 12.5 MG tablet Take 1 tablet (12.5 mg total) by mouth 2 (two) times daily.  60 tablet  6  . clopidogrel (PLAVIX) 75 MG tablet Take 1 tablet (75 mg total) by mouth daily with breakfast.  30 tablet  5  . DULoxetine (CYMBALTA) 60 MG capsule Take  60 mg by mouth at bedtime.       . furosemide (LASIX) 20 MG tablet Take 20 mg by mouth daily as needed for fluid.       . Multiple Vitamins-Minerals (HAIR/SKIN/NAILS) TABS Take 1 tablet by mouth daily.      . nitroGLYCERIN (NITROSTAT) 0.4 MG SL tablet Place 0.4 mg under the tongue every 5 (five) minutes as needed. For chest pain      . pantoprazole (PROTONIX) 40 MG tablet Take 40 mg by mouth every morning.       . polyethylene glycol (MIRALAX / GLYCOLAX) packet Take 17 g by mouth daily as needed. For constipation      . POTASSIUM GLUCONATE PO Take 500 mg by mouth every morning.      . ranolazine (RANEXA) 500 MG 12 hr tablet Take 1 tablet (500 mg total) by mouth 2 (two) times daily.  60 tablet  6   No current facility-administered medications for this visit.    Past Medical History  Diagnosis Date  . PVD (peripheral vascular disease)   . Other and unspecified hyperlipidemia   . Coronary atherosclerosis of native coronary artery     nonobstructive CAD by multiple catheterizations  . Panic disorder without agoraphobia   . HTN (hypertension)     Hx of it  . MI (myocardial infarction) 2006  . Internal hemorrhoids without mention of complication   . Dysphagia, unspecified(787.20)   . Microscopic  colitis 2003  . GERD (gastroesophageal reflux disease)     Hx Schatzki's ring, multiple EGD/ED last 01/06/2004  . Hyperlipemia   . Thyroid disease     recent abnl TSH per pt  . S/P colonoscopy 09/27/2001    internal hemorrhoids, desc colon inflam polyp, SB BX-chronic duodenitis, colitis  . Arthritis   . Shortness of breath   . Heart disease   . Bursitis     left shoulder  . Hyperlipidemia   . Sleep disorder     obstructive  . COPD (chronic obstructive pulmonary disease)   . Complication of anesthesia   . PONV (postoperative nausea and vomiting)     'a little nausea"  . Anxiety   . Depression   . Pneumonia 12/2011  . Heart murmur     'a littel'  . Cataract     Past Surgical History    Procedure Laterality Date  . Tonsillectomy    . Unspecified area, hysterectomy  1972    partial  . Cholecystectomy  1998  . Right knee replacement  2007  . Right leg benign tumor    . Breast lumpectomy  1998    left, benign  . Left hand surgery    . Left rotator cuff surgery    . Nasal sinus surgery    . Bladder tack  06/2010  . Cardiac catheterization    . Maloney dilation  03/16/2011    Gastritis. No H.pylori on bx. 68F maloney dilation with disruption of  occult cevical esophageal web  . Colonoscopy  03/16/2011    multiple hyperplastic colon polyps, sigmoid diverticulosis, melanosis coli  . Bladder suspension  11/09/2011    Procedure: TRANSVAGINAL TAPE (TVT) PROCEDURE;  Surgeon: Marissa Nestle, MD;  Location: [facility];  Service: Urology;  Laterality: N/A;  . Abdominal hysterectomy    . Appendectomy    . Carpal tunnel release  1989    left  . Anterior and posterior repair      with resection of vagina  . Cardiac catheterization    . Shoulder surgery Left   . Joint replacement Right 2007  . Lumbar laminectomy/decompression microdiscectomy N/A 10/11/2012    Procedure: LUMBAR LAMINECTOMY/DECOMPRESSION MICRODISCECTOMY 2 LEVELS;  Surgeon: Floyce Stakes, MD;  Location: [place] [facility];  Service: Neurosurgery;  Laterality: N/A;  L3-4 L4-5 Laminectomy    ROS: Review of systems complete and found to be negative unless listed above PHYSICAL EXAM BP 147/56  Pulse 67  Ht 5\' 1"  (1.549 m)  Wt 196 lb (88.905 kg)  BMI 37.05 kg/m2  General: Well developed, well nourished, in no acute distress, obese. Head: Eyes PERRLA, No xanthomas.   Normal cephalic and atramatic  Lungs: Clear bilaterally to auscultation and percussion. Heart: HRRR S1 S2, without MRG.  Pulses are 2+ & equal.            No carotid bruit. No JVD.  No abdominal bruits. No femoral bruits. Abdomen: Bowel sounds are positive, abdomen soft and non-tender without masses or                  Hernia's noted. Msk:  Back normal,  unsteady gait. Diminished strength and tone for age. Extremities: No clubbing, cyanosis or edema.  DP +1 Neuro: Alert and oriented X 3. Psych:  Good affect, responds appropriately  ASSESSMENT AND PLAN

## 2013-05-29 NOTE — Assessment & Plan Note (Signed)
She offers no complaints of recurrent chest pain. She is medically compliant. Refills on NTG are provided.

## 2013-06-17 DIAGNOSIS — G4730 Sleep apnea, unspecified: Secondary | ICD-10-CM | POA: Diagnosis not present

## 2013-06-17 DIAGNOSIS — K219 Gastro-esophageal reflux disease without esophagitis: Secondary | ICD-10-CM | POA: Diagnosis not present

## 2013-06-17 DIAGNOSIS — M545 Low back pain, unspecified: Secondary | ICD-10-CM | POA: Diagnosis not present

## 2013-06-17 DIAGNOSIS — J449 Chronic obstructive pulmonary disease, unspecified: Secondary | ICD-10-CM | POA: Diagnosis not present

## 2013-06-17 DIAGNOSIS — K649 Unspecified hemorrhoids: Secondary | ICD-10-CM | POA: Diagnosis not present

## 2013-06-17 DIAGNOSIS — K589 Irritable bowel syndrome without diarrhea: Secondary | ICD-10-CM | POA: Diagnosis not present

## 2013-06-17 DIAGNOSIS — I10 Essential (primary) hypertension: Secondary | ICD-10-CM | POA: Diagnosis not present

## 2013-06-17 DIAGNOSIS — E782 Mixed hyperlipidemia: Secondary | ICD-10-CM | POA: Diagnosis not present

## 2013-06-20 DIAGNOSIS — M545 Low back pain, unspecified: Secondary | ICD-10-CM | POA: Diagnosis not present

## 2013-06-20 DIAGNOSIS — I10 Essential (primary) hypertension: Secondary | ICD-10-CM | POA: Diagnosis not present

## 2013-06-20 DIAGNOSIS — IMO0002 Reserved for concepts with insufficient information to code with codable children: Secondary | ICD-10-CM | POA: Diagnosis not present

## 2013-06-20 DIAGNOSIS — E669 Obesity, unspecified: Secondary | ICD-10-CM | POA: Diagnosis not present

## 2013-07-31 ENCOUNTER — Encounter: Payer: Self-pay | Admitting: Cardiovascular Disease

## 2013-07-31 ENCOUNTER — Ambulatory Visit (INDEPENDENT_AMBULATORY_CARE_PROVIDER_SITE_OTHER): Payer: Medicare Other | Admitting: Cardiovascular Disease

## 2013-07-31 VITALS — BP 155/81 | HR 67 | Ht 61.0 in | Wt 193.8 lb

## 2013-07-31 DIAGNOSIS — Z9861 Coronary angioplasty status: Secondary | ICD-10-CM

## 2013-07-31 DIAGNOSIS — R079 Chest pain, unspecified: Secondary | ICD-10-CM

## 2013-07-31 DIAGNOSIS — R0602 Shortness of breath: Secondary | ICD-10-CM | POA: Diagnosis not present

## 2013-07-31 DIAGNOSIS — I5032 Chronic diastolic (congestive) heart failure: Secondary | ICD-10-CM

## 2013-07-31 DIAGNOSIS — I2510 Atherosclerotic heart disease of native coronary artery without angina pectoris: Secondary | ICD-10-CM | POA: Diagnosis not present

## 2013-07-31 DIAGNOSIS — E785 Hyperlipidemia, unspecified: Secondary | ICD-10-CM

## 2013-07-31 DIAGNOSIS — M479 Spondylosis, unspecified: Secondary | ICD-10-CM

## 2013-07-31 DIAGNOSIS — Z955 Presence of coronary angioplasty implant and graft: Secondary | ICD-10-CM

## 2013-07-31 DIAGNOSIS — I10 Essential (primary) hypertension: Secondary | ICD-10-CM

## 2013-07-31 MED ORDER — FUROSEMIDE 20 MG PO TABS: 20.0000 mg | ORAL_TABLET | Freq: Every day | ORAL | Status: DC

## 2013-07-31 MED ORDER — AMLODIPINE BESYLATE 5 MG PO TABS: 5.0000 mg | ORAL_TABLET | Freq: Every day | ORAL | Status: DC

## 2013-07-31 NOTE — Patient Instructions (Signed)
   Begin Norvasc 5mg  daily - new sent to pharm  Increase Lasix to 20mg  DAILY - new sent to pharm Continue all other medications.   Follow up in  2-3 weeks

## 2013-07-31 NOTE — Progress Notes (Signed)
Patient ID: [name], female   DOB: 20-Jan-1933, 81 y.o.   MRN: [id number]      SUBJECTIVE: [name] underwent percutaneous coronary intervention with drug-eluting stent to the left circumflex coronary artery in January 2015. Her proximal LAD showed mild nonobstructive disease and she had moderately severe disease in a small, nondominant RCA. She also has a history of hypertension, hyperlipidemia and chronic diastolic heart failure. She also has a history of depression and severe panic attacks. She has a h/o dyspnea which is multifactorial in etiology, but includes COPD due to a h/o tobacco abuse.  She said that she had been feeling well until the past 3 days. She has been experiencing left inframammary pain radiating to the back. She has also felt short of breath and has had to sleep with her head elevated. She denies leg swelling. She takes Lasix every other day. She sees Dr. Joya Salm for her spine disease, and tells me that surgery is being contemplated. She takes aspirin and Plavix daily, and has not missed any doses.     Allergies  Allergen Reactions  . Phenothiazines Anaphylaxis and Hives  . Polysorbate Anaphylaxis  . Prednisone Shortness Of Breath  . Codeine Itching  . Statins Nausea And Vomiting       . Prochlorperazine Other (See Comments)    unknown  . Atorvastatin Hives  . Ofloxacin Rash  . Other Itching and Rash    "WOOL"= make skin look like it has been burned  . Penicillins Other (See Comments)    Causes redness all over.   . Pimozide Hives and Itching    Current Outpatient Prescriptions  Medication Sig Dispense Refill  . acetaminophen (TYLENOL) 500 MG tablet Take 500 mg by mouth every 8 (eight) hours as needed for moderate pain.      [name] Kitchen aspirin 81 MG EC tablet Take 81 mg by mouth every morning.       . carvedilol (COREG) 12.5 MG tablet Take 1 tablet (12.5 mg total) by mouth 2 (two) times daily.  60 tablet  6  . clopidogrel (PLAVIX) 75 MG tablet Take 1 tablet (75  mg total) by mouth daily with breakfast.  30 tablet  5  . DULoxetine (CYMBALTA) 60 MG capsule Take 60 mg by mouth at bedtime.       . furosemide (LASIX) 20 MG tablet Take 20 mg by mouth daily as needed for fluid.       . Multiple Vitamins-Minerals (HAIR/SKIN/NAILS) TABS Take 1 tablet by mouth daily.      . nitroGLYCERIN (NITROSTAT) 0.4 MG SL tablet Place 1 tablet (0.4 mg total) under the tongue every 5 (five) minutes as needed. For chest pain  25 tablet  4  . pantoprazole (PROTONIX) 40 MG tablet Take 40 mg by mouth every morning.       . polyethylene glycol (MIRALAX / GLYCOLAX) packet Take 17 g by mouth daily as needed. For constipation      . POTASSIUM GLUCONATE PO Take 500 mg by mouth every morning.      . ranolazine (RANEXA) 500 MG 12 hr tablet Take 1 tablet (500 mg total) by mouth 2 (two) times daily.  60 tablet  6   No current facility-administered medications for this visit.    Past Medical History  Diagnosis Date  . PVD (peripheral vascular disease)   . Other and unspecified hyperlipidemia   . Coronary atherosclerosis of native coronary artery     nonobstructive CAD by multiple catheterizations  .  Panic disorder without agoraphobia   . HTN (hypertension)     Hx of it  . MI (myocardial infarction) 2006  . Internal hemorrhoids without mention of complication   . Dysphagia, unspecified(787.20)   . Microscopic colitis 2003  . GERD (gastroesophageal reflux disease)     Hx Schatzki's ring, multiple EGD/ED last 01/06/2004  . Hyperlipemia   . Thyroid disease     recent abnl TSH per pt  . S/P colonoscopy 09/27/2001    internal hemorrhoids, desc colon inflam polyp, SB BX-chronic duodenitis, colitis  . Arthritis   . Shortness of breath   . Heart disease   . Bursitis     left shoulder  . Hyperlipidemia   . Sleep disorder     obstructive  . COPD (chronic obstructive pulmonary disease)   . Complication of anesthesia   . PONV (postoperative nausea and vomiting)     'a little nausea"   . Anxiety   . Depression   . Pneumonia 12/2011  . Heart murmur     'a littel'  . Cataract     Past Surgical History  Procedure Laterality Date  . Tonsillectomy    . Unspecified area, hysterectomy  1972    partial  . Cholecystectomy  1998  . Right knee replacement  2007  . Right leg benign tumor    . Breast lumpectomy  1998    left, benign  . Left hand surgery    . Left rotator cuff surgery    . Nasal sinus surgery    . Bladder tack  06/2010  . Cardiac catheterization    . Maloney dilation  03/16/2011    Gastritis. No H.pylori on bx. 51F maloney dilation with disruption of  occult cevical esophageal web  . Colonoscopy  03/16/2011    multiple hyperplastic colon polyps, sigmoid diverticulosis, melanosis coli  . Bladder suspension  11/09/2011    Procedure: TRANSVAGINAL TAPE (TVT) PROCEDURE;  Surgeon: Marissa Nestle, MD;  Location: [facility];  Service: Urology;  Laterality: N/A;  . Abdominal hysterectomy    . Appendectomy    . Carpal tunnel release  1989    left  . Anterior and posterior repair      with resection of vagina  . Cardiac catheterization    . Shoulder surgery Left   . Joint replacement Right 2007  . Lumbar laminectomy/decompression microdiscectomy N/A 10/11/2012    Procedure: LUMBAR LAMINECTOMY/DECOMPRESSION MICRODISCECTOMY 2 LEVELS;  Surgeon: Floyce Stakes, MD;  Location: [facility];  Service: Neurosurgery;  Laterality: N/A;  L3-4 L4-5 Laminectomy    History   Social History  . Marital Status: Divorced    Spouse Name: N/A    Number of Children: 39  . Years of Education: N/A   Occupational History  . retired    Social History Main Topics  . Smoking status: Former Smoker -- 1.00 packs/day for 64 years    Types: Cigarettes    Quit date: 11/17/2001  . Smokeless tobacco: Never Used     Comment: no tobacco  . Alcohol Use: No  . Drug Use: No  . Sexual Activity: No   Other Topics Concern  . Not on file   Social History Narrative   Divorced.    Sister had colon perforation & died from complications in [place], [place] Vitals:   07/31/13 1354  Height: 5\' 1"  (1.549 m)  Weight: 193 lb 12.8 oz (87.907 kg)   BP 155/81 Pulse 67  PHYSICAL EXAM General: NAD Neck: No JVD, no thyromegaly. Lungs: Clear to auscultation bilaterally with normal respiratory effort. CV: Nondisplaced PMI.  Regular rate and rhythm, normal S1/S2, no S3/S4, no murmur. No pretibial or periankle edema.  No carotid bruit.  Normal pedal pulses.  Abdomen: Soft, nontender, no hepatosplenomegaly, no distention.  Neurologic: Alert and oriented x 3.  Psych: Normal affect. Extremities: No clubbing or cyanosis.   ECG: reviewed and available in electronic records.      ASSESSMENT AND PLAN: 1. CAD with DES to LCx: Given the fact that she recently had a drug-eluting stent in January, she will need to continue dual antiplatelet therapy with aspirin and Plavix for a minimum of 1 year. She is intolerant of statins. She is having left inframammary pain which radiates to the back. She also has severe disease of the spine, which may be contributing to her thoracic pain. Given her elevated blood pressure, I am starting amlodipine 5 mg daily which will also potentially alleviate ischemic pain. 2. HTN: This is elevated today. I will start amlodipine 5 mg daily. 3. Chronic diastolic heart failure: Optimal BP control is of prime importance. As stated previously, I will start amlodipine 5 mg daily. I am also increasing her Lasix to 20 mg daily rather than every other day. 4. Hyperlipidemia: Intolerant to statin therapy. This will be primarily controlled with diet for the time being.  Dispo: f/u 2-3 weeks.  Kate Sable, M.D., F.A.C.C.

## 2013-08-04 DIAGNOSIS — I10 Essential (primary) hypertension: Secondary | ICD-10-CM | POA: Diagnosis not present

## 2013-08-04 DIAGNOSIS — Z6836 Body mass index (BMI) 36.0-36.9, adult: Secondary | ICD-10-CM | POA: Diagnosis not present

## 2013-08-04 DIAGNOSIS — IMO0002 Reserved for concepts with insufficient information to code with codable children: Secondary | ICD-10-CM | POA: Diagnosis not present

## 2013-08-12 DIAGNOSIS — M545 Low back pain, unspecified: Secondary | ICD-10-CM | POA: Diagnosis not present

## 2013-08-12 DIAGNOSIS — R262 Difficulty in walking, not elsewhere classified: Secondary | ICD-10-CM | POA: Diagnosis not present

## 2013-08-12 DIAGNOSIS — IMO0002 Reserved for concepts with insufficient information to code with codable children: Secondary | ICD-10-CM | POA: Diagnosis not present

## 2013-08-13 DIAGNOSIS — IMO0002 Reserved for concepts with insufficient information to code with codable children: Secondary | ICD-10-CM | POA: Diagnosis not present

## 2013-08-13 DIAGNOSIS — R262 Difficulty in walking, not elsewhere classified: Secondary | ICD-10-CM | POA: Diagnosis not present

## 2013-08-13 DIAGNOSIS — M545 Low back pain, unspecified: Secondary | ICD-10-CM | POA: Diagnosis not present

## 2013-08-14 ENCOUNTER — Telehealth: Payer: Self-pay | Admitting: *Deleted

## 2013-08-14 DIAGNOSIS — R262 Difficulty in walking, not elsewhere classified: Secondary | ICD-10-CM | POA: Diagnosis not present

## 2013-08-14 DIAGNOSIS — M545 Low back pain, unspecified: Secondary | ICD-10-CM | POA: Diagnosis not present

## 2013-08-14 DIAGNOSIS — IMO0002 Reserved for concepts with insufficient information to code with codable children: Secondary | ICD-10-CM | POA: Diagnosis not present

## 2013-08-14 NOTE — Telephone Encounter (Signed)
Fax received from [facility] - requesting release for treatment of E-Stim (similar to TENS unit) during her physical therapy.  Fax stated that she was a new patient starting with them this week, referred by Dr. Collins Scotland for treatment of radiculopathy/lumbar.  She is scheduled to go back for treatment tomorrow & the following Monday.

## 2013-08-15 NOTE — Telephone Encounter (Signed)
That would be fine. However, as stated in my note, she cannot stop either ASA or Plavix as she had a drug-eluting stent placed in January 2015.

## 2013-08-18 NOTE — Telephone Encounter (Signed)
This note will be faxed to [facility].

## 2013-08-20 ENCOUNTER — Encounter: Payer: Self-pay | Admitting: Cardiovascular Disease

## 2013-08-20 ENCOUNTER — Ambulatory Visit (INDEPENDENT_AMBULATORY_CARE_PROVIDER_SITE_OTHER): Payer: Medicare Other | Admitting: Cardiovascular Disease

## 2013-08-20 VITALS — BP 154/80 | HR 71 | Ht 61.0 in | Wt 194.0 lb

## 2013-08-20 DIAGNOSIS — I2510 Atherosclerotic heart disease of native coronary artery without angina pectoris: Secondary | ICD-10-CM | POA: Diagnosis not present

## 2013-08-20 DIAGNOSIS — R079 Chest pain, unspecified: Secondary | ICD-10-CM | POA: Diagnosis not present

## 2013-08-20 DIAGNOSIS — I10 Essential (primary) hypertension: Secondary | ICD-10-CM | POA: Diagnosis not present

## 2013-08-20 DIAGNOSIS — Z955 Presence of coronary angioplasty implant and graft: Secondary | ICD-10-CM

## 2013-08-20 DIAGNOSIS — E785 Hyperlipidemia, unspecified: Secondary | ICD-10-CM

## 2013-08-20 DIAGNOSIS — J310 Chronic rhinitis: Secondary | ICD-10-CM | POA: Diagnosis not present

## 2013-08-20 DIAGNOSIS — I5032 Chronic diastolic (congestive) heart failure: Secondary | ICD-10-CM | POA: Diagnosis not present

## 2013-08-20 DIAGNOSIS — Z9861 Coronary angioplasty status: Secondary | ICD-10-CM

## 2013-08-20 MED ORDER — AMLODIPINE BESYLATE 10 MG PO TABS: 10.0000 mg | ORAL_TABLET | Freq: Every day | ORAL | Status: DC

## 2013-08-20 NOTE — Patient Instructions (Signed)
   Increase Amlodipine to 10mg  daily (may take 2 tabs of your 5mg  tablet till finish current supply) - new sent to pharm Continue all other medications.   Your physician wants you to follow up in: 6 months.  You will receive a reminder letter in the mail one-two months in advance.  If you don't receive a letter, please call our office to schedule the follow up appointment

## 2013-08-20 NOTE — Progress Notes (Signed)
Patient ID: [name], female   DOB: 10-Apr-1933, 80 y.o.   MRN: [id number]      SUBJECTIVE: [name] underwent percutaneous coronary intervention with drug-eluting stent to the left circumflex coronary artery in January 2015. Her proximal LAD showed mild nonobstructive disease and she had moderately severe disease in a small, nondominant RCA. She also has a history of hypertension, hyperlipidemia, chronic diastolic heart failure, depression and severe panic attacks. She has a h/o dyspnea which is multifactorial in etiology, but includes COPD due to a h/o tobacco abuse.  At her last visit, she had been complaining of chest pain radiating to the back as well as increasing shortness of breath. For this reason, I started amlodipine at her last visit as her blood pressure was also elevated, and I also increased Lasix to 20 mg daily.  Other than sinus congestion and sinus headache, she is feeling better with regards to her chest pain. It is only mild now and less frequent.  She checks her BP twice daily and systolic readings are consistently in the 150-170 mmHg range.     Allergies  Allergen Reactions  . Phenothiazines Anaphylaxis and Hives  . Polysorbate Anaphylaxis  . Prednisone Shortness Of Breath  . Codeine Itching  . Statins Nausea And Vomiting       . Prochlorperazine Other (See Comments)    unknown  . Atorvastatin Hives  . Ofloxacin Rash  . Other Itching and Rash    "WOOL"= make skin look like it has been burned  . Penicillins Other (See Comments)    Causes redness all over.   . Pimozide Hives and Itching    Current Outpatient Prescriptions  Medication Sig Dispense Refill  . acetaminophen (TYLENOL) 500 MG tablet Take 500 mg by mouth every 8 (eight) hours as needed for moderate pain.      [name] Kitchen ALPRAZolam (XANAX) 0.25 MG tablet Take 0.25 mg by mouth daily.      [name] Kitchen amLODipine (NORVASC) 5 MG tablet Take 1 tablet (5 mg total) by mouth daily.  30 tablet  6  . aspirin 81 MG EC  tablet Take 81 mg by mouth every morning.       . carvedilol (COREG) 12.5 MG tablet Take 1 tablet (12.5 mg total) by mouth 2 (two) times daily.  60 tablet  6  . clopidogrel (PLAVIX) 75 MG tablet Take 1 tablet (75 mg total) by mouth daily with breakfast.  30 tablet  5  . DULoxetine (CYMBALTA) 60 MG capsule Take 60 mg by mouth at bedtime.       . furosemide (LASIX) 20 MG tablet Take 1 tablet (20 mg total) by mouth daily.  30 tablet  6  . Multiple Vitamins-Minerals (HAIR/SKIN/NAILS) TABS Take 1 tablet by mouth daily.      . nitroGLYCERIN (NITROSTAT) 0.4 MG SL tablet Place 1 tablet (0.4 mg total) under the tongue every 5 (five) minutes as needed. For chest pain  25 tablet  4  . pantoprazole (PROTONIX) 40 MG tablet Take 40 mg by mouth every morning.       . polyethylene glycol (MIRALAX / GLYCOLAX) packet Take 17 g by mouth daily as needed. For constipation      . POTASSIUM GLUCONATE PO Take 500 mg by mouth every morning.      . ranolazine (RANEXA) 500 MG 12 hr tablet Take 1 tablet (500 mg total) by mouth 2 (two) times daily.  60 tablet  6  . traMADol (ULTRAM) 50 MG tablet Take  50 mg by mouth 2 (two) times daily.       No current facility-administered medications for this visit.    Past Medical History  Diagnosis Date  . PVD (peripheral vascular disease)   . Other and unspecified hyperlipidemia   . Coronary atherosclerosis of native coronary artery     nonobstructive CAD by multiple catheterizations  . Panic disorder without agoraphobia   . HTN (hypertension)     Hx of it  . MI (myocardial infarction) 2006  . Internal hemorrhoids without mention of complication   . Dysphagia, unspecified(787.20)   . Microscopic colitis 2003  . GERD (gastroesophageal reflux disease)     Hx Schatzki's ring, multiple EGD/ED last 01/06/2004  . Hyperlipemia   . Thyroid disease     recent abnl TSH per pt  . S/P colonoscopy 09/27/2001    internal hemorrhoids, desc colon inflam polyp, SB BX-chronic duodenitis,  colitis  . Arthritis   . Shortness of breath   . Heart disease   . Bursitis     left shoulder  . Hyperlipidemia   . Sleep disorder     obstructive  . COPD (chronic obstructive pulmonary disease)   . Complication of anesthesia   . PONV (postoperative nausea and vomiting)     'a little nausea"  . Anxiety   . Depression   . Pneumonia 12/2011  . Heart murmur     'a littel'  . Cataract     Past Surgical History  Procedure Laterality Date  . Tonsillectomy    . Unspecified area, hysterectomy  1972    partial  . Cholecystectomy  1998  . Right knee replacement  2007  . Right leg benign tumor    . Breast lumpectomy  1998    left, benign  . Left hand surgery    . Left rotator cuff surgery    . Nasal sinus surgery    . Bladder tack  06/2010  . Cardiac catheterization    . Maloney dilation  03/16/2011    Gastritis. No H.pylori on bx. 22F maloney dilation with disruption of  occult cevical esophageal web  . Colonoscopy  03/16/2011    multiple hyperplastic colon polyps, sigmoid diverticulosis, melanosis coli  . Bladder suspension  11/09/2011    Procedure: TRANSVAGINAL TAPE (TVT) PROCEDURE;  Surgeon: Marissa Nestle, MD;  Location: [facility];  Service: Urology;  Laterality: N/A;  . Abdominal hysterectomy    . Appendectomy    . Carpal tunnel release  1989    left  . Anterior and posterior repair      with resection of vagina  . Cardiac catheterization    . Shoulder surgery Left   . Joint replacement Right 2007  . Lumbar laminectomy/decompression microdiscectomy N/A 10/11/2012    Procedure: LUMBAR LAMINECTOMY/DECOMPRESSION MICRODISCECTOMY 2 LEVELS;  Surgeon: Floyce Stakes, MD;  Location: [facility];  Service: Neurosurgery;  Laterality: N/A;  L3-4 L4-5 Laminectomy    History   Social History  . Marital Status: Divorced    Spouse Name: N/A    Number of Children: 52  . Years of Education: N/A   Occupational History  . retired    Social History Main Topics  . Smoking  status: Former Smoker -- 1.00 packs/day for 64 years    Types: Cigarettes    Quit date: 11/17/2001  . Smokeless tobacco: Never Used     Comment: no tobacco  . Alcohol Use: No  . Drug Use: No  . Sexual Activity: No  Other Topics Concern  . Not on file   Social History Narrative   Divorced.   Sister had colon perforation & died from complications in [place],  AFB    BP 154/80 Pulse 71    PHYSICAL EXAM General: NAD Neck: No JVD, no thyromegaly. Lungs: Clear to auscultation bilaterally with normal respiratory effort. CV: Nondisplaced PMI.  Regular rate and rhythm, normal S1/S2, no S3/S4, no murmur. No pretibial or periankle edema.  No carotid bruit.  Normal pedal pulses.  Abdomen: Soft, nontender, no hepatosplenomegaly, no distention.  Neurologic: Alert and oriented x 3.  Psych: Normal affect. Extremities: No clubbing or cyanosis.   ECG: reviewed and available in electronic records.      ASSESSMENT AND PLAN: 1. CAD with DES to LCx: Given the fact that she recently had a drug-eluting stent in January, she will need to continue dual antiplatelet therapy with aspirin and Plavix for a minimum of 1 year. She is intolerant of statins. Her chest pain is under better control with the addition of amlodipine. 2. HTN: This remains elevated today. I will increase amlodipine to 10 mg daily.  3. Chronic diastolic heart failure: Optimal BP control is of prime importance. As stated previously,  I will increase amlodipine to 10 mg daily. I am continuing Lasix 20 mg daily.  4. Hyperlipidemia: Intolerant to statin therapy. This will be primarily controlled with diet for the time being.   Dispo: f/u 6 months.   Kate Sable, M.D., F.A.C.C.

## 2013-08-22 DIAGNOSIS — M545 Low back pain, unspecified: Secondary | ICD-10-CM | POA: Diagnosis not present

## 2013-08-22 DIAGNOSIS — IMO0002 Reserved for concepts with insufficient information to code with codable children: Secondary | ICD-10-CM | POA: Diagnosis not present

## 2013-08-22 DIAGNOSIS — R262 Difficulty in walking, not elsewhere classified: Secondary | ICD-10-CM | POA: Diagnosis not present

## 2013-08-25 DIAGNOSIS — IMO0002 Reserved for concepts with insufficient information to code with codable children: Secondary | ICD-10-CM | POA: Diagnosis not present

## 2013-08-25 DIAGNOSIS — M545 Low back pain, unspecified: Secondary | ICD-10-CM | POA: Diagnosis not present

## 2013-08-25 DIAGNOSIS — R262 Difficulty in walking, not elsewhere classified: Secondary | ICD-10-CM | POA: Diagnosis not present

## 2013-08-26 ENCOUNTER — Other Ambulatory Visit: Payer: Self-pay | Admitting: Cardiovascular Disease

## 2013-08-27 DIAGNOSIS — M545 Low back pain, unspecified: Secondary | ICD-10-CM | POA: Diagnosis not present

## 2013-08-27 DIAGNOSIS — IMO0002 Reserved for concepts with insufficient information to code with codable children: Secondary | ICD-10-CM | POA: Diagnosis not present

## 2013-08-27 DIAGNOSIS — R262 Difficulty in walking, not elsewhere classified: Secondary | ICD-10-CM | POA: Diagnosis not present

## 2013-08-28 DIAGNOSIS — M545 Low back pain, unspecified: Secondary | ICD-10-CM | POA: Diagnosis not present

## 2013-08-28 DIAGNOSIS — IMO0002 Reserved for concepts with insufficient information to code with codable children: Secondary | ICD-10-CM | POA: Diagnosis not present

## 2013-08-28 DIAGNOSIS — R262 Difficulty in walking, not elsewhere classified: Secondary | ICD-10-CM | POA: Diagnosis not present

## 2013-09-02 DIAGNOSIS — M545 Low back pain, unspecified: Secondary | ICD-10-CM | POA: Diagnosis not present

## 2013-09-02 DIAGNOSIS — IMO0002 Reserved for concepts with insufficient information to code with codable children: Secondary | ICD-10-CM | POA: Diagnosis not present

## 2013-09-02 DIAGNOSIS — R262 Difficulty in walking, not elsewhere classified: Secondary | ICD-10-CM | POA: Diagnosis not present

## 2013-09-03 DIAGNOSIS — M545 Low back pain, unspecified: Secondary | ICD-10-CM | POA: Diagnosis not present

## 2013-09-03 DIAGNOSIS — R262 Difficulty in walking, not elsewhere classified: Secondary | ICD-10-CM | POA: Diagnosis not present

## 2013-09-03 DIAGNOSIS — IMO0002 Reserved for concepts with insufficient information to code with codable children: Secondary | ICD-10-CM | POA: Diagnosis not present

## 2013-09-04 DIAGNOSIS — M545 Low back pain, unspecified: Secondary | ICD-10-CM | POA: Diagnosis not present

## 2013-09-04 DIAGNOSIS — IMO0002 Reserved for concepts with insufficient information to code with codable children: Secondary | ICD-10-CM | POA: Diagnosis not present

## 2013-09-04 DIAGNOSIS — R262 Difficulty in walking, not elsewhere classified: Secondary | ICD-10-CM | POA: Diagnosis not present

## 2013-09-10 DIAGNOSIS — R262 Difficulty in walking, not elsewhere classified: Secondary | ICD-10-CM | POA: Diagnosis not present

## 2013-09-10 DIAGNOSIS — IMO0002 Reserved for concepts with insufficient information to code with codable children: Secondary | ICD-10-CM | POA: Diagnosis not present

## 2013-09-10 DIAGNOSIS — M545 Low back pain, unspecified: Secondary | ICD-10-CM | POA: Diagnosis not present

## 2013-09-15 ENCOUNTER — Telehealth: Payer: Self-pay | Admitting: *Deleted

## 2013-09-15 NOTE — Telephone Encounter (Signed)
Patient c/o active chest pain going through to her back.  Rating 7/10.  Stated that she has not taken any NTG because she just wasn't sure.  Patient in for OV on 5/6 where she c/o similar type pain.  Amlodipine was increased to 10mg  daily.  Now stating that her SOB is becoming more noticeable and can happen anytime.  Also, c/o sweats & feeling clammy.  Stated was feeling concerned this time.  Advised her to go to ED for evaluation.  Stated she will have her daughter take her to [facility].

## 2013-09-17 DIAGNOSIS — K219 Gastro-esophageal reflux disease without esophagitis: Secondary | ICD-10-CM | POA: Diagnosis not present

## 2013-09-17 DIAGNOSIS — M545 Low back pain, unspecified: Secondary | ICD-10-CM | POA: Diagnosis not present

## 2013-09-17 DIAGNOSIS — J449 Chronic obstructive pulmonary disease, unspecified: Secondary | ICD-10-CM | POA: Diagnosis not present

## 2013-09-17 DIAGNOSIS — I10 Essential (primary) hypertension: Secondary | ICD-10-CM | POA: Diagnosis not present

## 2013-09-17 DIAGNOSIS — G4730 Sleep apnea, unspecified: Secondary | ICD-10-CM | POA: Diagnosis not present

## 2013-09-17 DIAGNOSIS — K589 Irritable bowel syndrome without diarrhea: Secondary | ICD-10-CM | POA: Diagnosis not present

## 2013-09-17 DIAGNOSIS — K649 Unspecified hemorrhoids: Secondary | ICD-10-CM | POA: Diagnosis not present

## 2013-09-17 DIAGNOSIS — E782 Mixed hyperlipidemia: Secondary | ICD-10-CM | POA: Diagnosis not present

## 2013-09-24 DIAGNOSIS — Z1231 Encounter for screening mammogram for malignant neoplasm of breast: Secondary | ICD-10-CM | POA: Diagnosis not present

## 2013-10-09 DIAGNOSIS — L820 Inflamed seborrheic keratosis: Secondary | ICD-10-CM | POA: Diagnosis not present

## 2013-10-09 DIAGNOSIS — D235 Other benign neoplasm of skin of trunk: Secondary | ICD-10-CM | POA: Diagnosis not present

## 2013-10-09 DIAGNOSIS — Z85828 Personal history of other malignant neoplasm of skin: Secondary | ICD-10-CM | POA: Diagnosis not present

## 2013-10-09 DIAGNOSIS — L280 Lichen simplex chronicus: Secondary | ICD-10-CM | POA: Diagnosis not present

## 2013-11-07 ENCOUNTER — Other Ambulatory Visit (HOSPITAL_COMMUNITY): Payer: Self-pay | Admitting: Cardiology

## 2013-11-24 ENCOUNTER — Emergency Department (HOSPITAL_COMMUNITY): Payer: Medicare Other

## 2013-11-24 ENCOUNTER — Observation Stay (HOSPITAL_COMMUNITY)
Admission: EM | Admit: 2013-11-24 | Discharge: 2013-11-26 | Disposition: A | Discharge status: Home / Self Care | Payer: Medicare Other | Attending: Emergency Medicine | Admitting: Emergency Medicine

## 2013-11-24 ENCOUNTER — Encounter (HOSPITAL_COMMUNITY): Payer: Self-pay | Admitting: Emergency Medicine

## 2013-11-24 DIAGNOSIS — R0602 Shortness of breath: Secondary | ICD-10-CM

## 2013-11-24 DIAGNOSIS — I2510 Atherosclerotic heart disease of native coronary artery without angina pectoris: Secondary | ICD-10-CM | POA: Diagnosis not present

## 2013-11-24 DIAGNOSIS — R011 Cardiac murmur, unspecified: Secondary | ICD-10-CM | POA: Diagnosis not present

## 2013-11-24 DIAGNOSIS — I219 Acute myocardial infarction, unspecified: Secondary | ICD-10-CM

## 2013-11-24 DIAGNOSIS — F3289 Other specified depressive episodes: Secondary | ICD-10-CM | POA: Insufficient documentation

## 2013-11-24 DIAGNOSIS — F329 Major depressive disorder, single episode, unspecified: Secondary | ICD-10-CM | POA: Diagnosis not present

## 2013-11-24 DIAGNOSIS — K5289 Other specified noninfective gastroenteritis and colitis: Secondary | ICD-10-CM

## 2013-11-24 DIAGNOSIS — H269 Unspecified cataract: Secondary | ICD-10-CM | POA: Insufficient documentation

## 2013-11-24 DIAGNOSIS — N189 Chronic kidney disease, unspecified: Secondary | ICD-10-CM | POA: Diagnosis present

## 2013-11-24 DIAGNOSIS — E079 Disorder of thyroid, unspecified: Secondary | ICD-10-CM | POA: Diagnosis not present

## 2013-11-24 DIAGNOSIS — I10 Essential (primary) hypertension: Secondary | ICD-10-CM | POA: Diagnosis not present

## 2013-11-24 DIAGNOSIS — I209 Angina pectoris, unspecified: Secondary | ICD-10-CM

## 2013-11-24 DIAGNOSIS — M7989 Other specified soft tissue disorders: Secondary | ICD-10-CM | POA: Diagnosis not present

## 2013-11-24 DIAGNOSIS — Z880 Allergy status to penicillin: Secondary | ICD-10-CM | POA: Insufficient documentation

## 2013-11-24 DIAGNOSIS — I252 Old myocardial infarction: Secondary | ICD-10-CM | POA: Insufficient documentation

## 2013-11-24 DIAGNOSIS — R0789 Other chest pain: Secondary | ICD-10-CM | POA: Diagnosis not present

## 2013-11-24 DIAGNOSIS — Z79899 Other long term (current) drug therapy: Secondary | ICD-10-CM | POA: Diagnosis not present

## 2013-11-24 DIAGNOSIS — M129 Arthropathy, unspecified: Secondary | ICD-10-CM | POA: Insufficient documentation

## 2013-11-24 DIAGNOSIS — E785 Hyperlipidemia, unspecified: Secondary | ICD-10-CM

## 2013-11-24 DIAGNOSIS — K219 Gastro-esophageal reflux disease without esophagitis: Secondary | ICD-10-CM | POA: Insufficient documentation

## 2013-11-24 DIAGNOSIS — K921 Melena: Secondary | ICD-10-CM

## 2013-11-24 DIAGNOSIS — R5383 Other fatigue: Secondary | ICD-10-CM

## 2013-11-24 DIAGNOSIS — K648 Other hemorrhoids: Secondary | ICD-10-CM | POA: Diagnosis not present

## 2013-11-24 DIAGNOSIS — G4733 Obstructive sleep apnea (adult) (pediatric): Secondary | ICD-10-CM | POA: Diagnosis not present

## 2013-11-24 DIAGNOSIS — I739 Peripheral vascular disease, unspecified: Secondary | ICD-10-CM

## 2013-11-24 DIAGNOSIS — Z7982 Long term (current) use of aspirin: Secondary | ICD-10-CM | POA: Insufficient documentation

## 2013-11-24 DIAGNOSIS — Z8679 Personal history of other diseases of the circulatory system: Secondary | ICD-10-CM

## 2013-11-24 DIAGNOSIS — J441 Chronic obstructive pulmonary disease with (acute) exacerbation: Secondary | ICD-10-CM | POA: Insufficient documentation

## 2013-11-24 DIAGNOSIS — Z87891 Personal history of nicotine dependence: Secondary | ICD-10-CM | POA: Insufficient documentation

## 2013-11-24 DIAGNOSIS — I519 Heart disease, unspecified: Secondary | ICD-10-CM | POA: Diagnosis not present

## 2013-11-24 DIAGNOSIS — R5381 Other malaise: Secondary | ICD-10-CM

## 2013-11-24 DIAGNOSIS — R600 Localized edema: Secondary | ICD-10-CM | POA: Diagnosis not present

## 2013-11-24 DIAGNOSIS — Z7902 Long term (current) use of antithrombotics/antiplatelets: Secondary | ICD-10-CM | POA: Insufficient documentation

## 2013-11-24 DIAGNOSIS — R079 Chest pain, unspecified: Secondary | ICD-10-CM

## 2013-11-24 DIAGNOSIS — Z8701 Personal history of pneumonia (recurrent): Secondary | ICD-10-CM | POA: Diagnosis not present

## 2013-11-24 DIAGNOSIS — N179 Acute kidney failure, unspecified: Secondary | ICD-10-CM | POA: Diagnosis present

## 2013-11-24 DIAGNOSIS — E876 Hypokalemia: Secondary | ICD-10-CM

## 2013-11-24 DIAGNOSIS — F410 Panic disorder [episodic paroxysmal anxiety] without agoraphobia: Secondary | ICD-10-CM | POA: Diagnosis not present

## 2013-11-24 DIAGNOSIS — R1310 Dysphagia, unspecified: Secondary | ICD-10-CM

## 2013-11-24 DIAGNOSIS — I5032 Chronic diastolic (congestive) heart failure: Secondary | ICD-10-CM

## 2013-11-24 LAB — CBC WITH DIFFERENTIAL/PLATELET
BASOS PCT: 1 % (ref 0–1)
Basophils Absolute: 0 10*3/uL (ref 0.0–0.1)
Eosinophils Absolute: 0.1 10*3/uL (ref 0.0–0.7)
Eosinophils Relative: 3 % (ref 0–5)
HCT: 34.9 % — ABNORMAL LOW (ref 36.0–46.0)
Hemoglobin: 12.3 g/dL (ref 12.0–15.0)
LYMPHS ABS: 1.6 10*3/uL (ref 0.7–4.0)
Lymphocytes Relative: 30 % (ref 12–46)
MCH: 31.6 pg (ref 26.0–34.0)
MCHC: 35.2 g/dL (ref 30.0–36.0)
MCV: 89.7 fL (ref 78.0–100.0)
Monocytes Absolute: 0.3 10*3/uL (ref 0.1–1.0)
Monocytes Relative: 5 % (ref 3–12)
NEUTROS PCT: 61 % (ref 43–77)
Neutro Abs: 3.2 10*3/uL (ref 1.7–7.7)
PLATELETS: 257 10*3/uL (ref 150–400)
RBC: 3.89 MIL/uL (ref 3.87–5.11)
RDW: 12.7 % (ref 11.5–15.5)
WBC: 5.2 10*3/uL (ref 4.0–10.5)

## 2013-11-24 LAB — BASIC METABOLIC PANEL
ANION GAP: 13 (ref 5–15)
BUN: 17 mg/dL (ref 6–23)
CO2: 25 mEq/L (ref 19–32)
Calcium: 9.2 mg/dL (ref 8.4–10.5)
Chloride: 100 mEq/L (ref 96–112)
Creatinine, Ser: 1.13 mg/dL — ABNORMAL HIGH (ref 0.50–1.10)
GFR, EST AFRICAN AMERICAN: 52 mL/min — AB (ref >90)
GFR, EST NON AFRICAN AMERICAN: 45 mL/min — AB (ref >90)
Glucose, Bld: 139 mg/dL — ABNORMAL HIGH (ref 70–99)
POTASSIUM: 3.6 meq/L — AB (ref 3.7–5.3)
Sodium: 138 mEq/L (ref 137–147)

## 2013-11-24 LAB — LIPID PANEL
CHOLESTEROL: 181 mg/dL (ref 0–200)
HDL: 56 mg/dL (ref >39)
LDL Cholesterol: 103 mg/dL — ABNORMAL HIGH (ref 0–99)
TRIGLYCERIDES: 111 mg/dL (ref <150)
Total CHOL/HDL Ratio: 3.2 RATIO
VLDL: 22 mg/dL (ref 0–40)

## 2013-11-24 LAB — PRO B NATRIURETIC PEPTIDE: Pro B Natriuretic peptide (BNP): 292.3 pg/mL (ref 0–450)

## 2013-11-24 LAB — MAGNESIUM: Magnesium: 1.9 mg/dL (ref 1.5–2.5)

## 2013-11-24 LAB — TROPONIN I: Troponin I: <0.3 ng/mL (ref <0.30)

## 2013-11-24 LAB — I-STAT TROPONIN, ED: TROPONIN I, POC: 0 ng/mL (ref 0.00–0.08)

## 2013-11-24 MED ORDER — SENNA 8.6 MG PO TABS
1.0000 | ORAL_TABLET | Freq: Two times a day (BID) | ORAL | Status: DC
Start: 2013-11-24 — End: 2013-11-26
  Administered 2013-11-24 – 2013-11-26 (×3): 8.6 mg via ORAL
  Filled 2013-11-24 (×4): qty 1

## 2013-11-24 MED ORDER — ENOXAPARIN SODIUM 40 MG/0.4ML ~~LOC~~ SOLN
40.0000 mg | SUBCUTANEOUS | Status: DC
  Administered 2013-11-24 – 2013-11-25 (×2): 40 mg via SUBCUTANEOUS
  Filled 2013-11-24 (×2): qty 0.4

## 2013-11-24 MED ORDER — RANOLAZINE ER 500 MG PO TB12
500.0000 mg | ORAL_TABLET | Freq: Two times a day (BID) | ORAL | Status: DC
  Administered 2013-11-24 – 2013-11-26 (×4): 500 mg via ORAL
  Filled 2013-11-24 (×8): qty 1

## 2013-11-24 MED ORDER — ACETAMINOPHEN 650 MG RE SUPP: 650.0000 mg | Freq: Four times a day (QID) | RECTAL | Status: DC | PRN

## 2013-11-24 MED ORDER — ONDANSETRON HCL 4 MG/2ML IJ SOLN
4.0000 mg | Freq: Four times a day (QID) | INTRAMUSCULAR | Status: DC | PRN
Start: 2013-11-24 — End: 2013-11-26

## 2013-11-24 MED ORDER — SODIUM CHLORIDE 0.9 % IJ SOLN
3.0000 mL | Freq: Two times a day (BID) | INTRAMUSCULAR | Status: DC
  Administered 2013-11-25: 3 mL via INTRAVENOUS

## 2013-11-24 MED ORDER — ALPRAZOLAM 0.25 MG PO TABS: 0.2500 mg | ORAL_TABLET | Freq: Every day | ORAL | Status: DC | PRN

## 2013-11-24 MED ORDER — PANTOPRAZOLE SODIUM 40 MG PO TBEC
40.0000 mg | DELAYED_RELEASE_TABLET | Freq: Every morning | ORAL | Status: DC
  Administered 2013-11-25 – 2013-11-26 (×2): 40 mg via ORAL
  Filled 2013-11-24 (×2): qty 1

## 2013-11-24 MED ORDER — FUROSEMIDE 20 MG PO TABS
20.0000 mg | ORAL_TABLET | Freq: Every day | ORAL | Status: DC
  Administered 2013-11-25 – 2013-11-26 (×2): 20 mg via ORAL
  Filled 2013-11-24 (×2): qty 1

## 2013-11-24 MED ORDER — SODIUM CHLORIDE 0.9 % IJ SOLN
3.0000 mL | INTRAMUSCULAR | Status: DC | PRN
  Administered 2013-11-25: 10 mL via INTRAVENOUS

## 2013-11-24 MED ORDER — MORPHINE SULFATE 2 MG/ML IJ SOLN: 1.0000 mg | INTRAMUSCULAR | Status: DC | PRN

## 2013-11-24 MED ORDER — ACETAMINOPHEN 325 MG PO TABS: 650.0000 mg | ORAL_TABLET | Freq: Four times a day (QID) | ORAL | Status: DC | PRN

## 2013-11-24 MED ORDER — CLOPIDOGREL BISULFATE 75 MG PO TABS
75.0000 mg | ORAL_TABLET | Freq: Every day | ORAL | Status: DC
  Administered 2013-11-25 – 2013-11-26 (×2): 75 mg via ORAL
  Filled 2013-11-24 (×2): qty 1

## 2013-11-24 MED ORDER — POLYETHYLENE GLYCOL 3350 17 G PO PACK: 17.0000 g | PACK | Freq: Every day | ORAL | Status: DC | PRN

## 2013-11-24 MED ORDER — REGADENOSON 0.4 MG/5ML IV SOLN
0.4000 mg | Freq: Once | INTRAVENOUS | Status: AC
  Administered 2013-11-25: 0.4 mg via INTRAVENOUS
  Filled 2013-11-24: qty 5

## 2013-11-24 MED ORDER — HYDROCODONE-ACETAMINOPHEN 5-325 MG PO TABS: 1.0000 | ORAL_TABLET | ORAL | Status: DC | PRN

## 2013-11-24 MED ORDER — SODIUM CHLORIDE 0.9 % IV SOLN
250.0000 mL | INTRAVENOUS | Status: DC | PRN
  Administered 2013-11-24: 250 mL via INTRAVENOUS

## 2013-11-24 MED ORDER — NITROGLYCERIN 2 % TD OINT
1.0000 [in_us] | TOPICAL_OINTMENT | Freq: Once | TRANSDERMAL | Status: AC
  Administered 2013-11-24: 1 [in_us] via TOPICAL
  Filled 2013-11-24: qty 1

## 2013-11-24 MED ORDER — ALUM & MAG HYDROXIDE-SIMETH 200-200-20 MG/5ML PO SUSP: 30.0000 mL | Freq: Four times a day (QID) | ORAL | Status: DC | PRN

## 2013-11-24 MED ORDER — DULOXETINE HCL 60 MG PO CPEP
60.0000 mg | ORAL_CAPSULE | Freq: Every day | ORAL | Status: DC
  Administered 2013-11-24 – 2013-11-25 (×2): 60 mg via ORAL
  Filled 2013-11-24 (×2): qty 1

## 2013-11-24 MED ORDER — BISACODYL 10 MG RE SUPP: 10.0000 mg | Freq: Every day | RECTAL | Status: DC | PRN

## 2013-11-24 MED ORDER — TRAZODONE HCL 50 MG PO TABS: 25.0000 mg | ORAL_TABLET | Freq: Every evening | ORAL | Status: DC | PRN

## 2013-11-24 MED ORDER — ASPIRIN EC 325 MG PO TBEC
325.0000 mg | DELAYED_RELEASE_TABLET | Freq: Every day | ORAL | Status: DC
  Administered 2013-11-25 – 2013-11-26 (×2): 325 mg via ORAL
  Filled 2013-11-24 (×2): qty 1

## 2013-11-24 MED ORDER — AMLODIPINE BESYLATE 5 MG PO TABS
10.0000 mg | ORAL_TABLET | Freq: Every day | ORAL | Status: DC
  Administered 2013-11-25 – 2013-11-26 (×2): 10 mg via ORAL
  Filled 2013-11-24 (×2): qty 2

## 2013-11-24 MED ORDER — ASPIRIN 81 MG PO CHEW
324.0000 mg | CHEWABLE_TABLET | Freq: Once | ORAL | Status: AC
  Administered 2013-11-24: 324 mg via ORAL
  Filled 2013-11-24: qty 4

## 2013-11-24 MED ORDER — CARVEDILOL 12.5 MG PO TABS
12.5000 mg | ORAL_TABLET | Freq: Two times a day (BID) | ORAL | Status: DC
  Administered 2013-11-24 – 2013-11-26 (×4): 12.5 mg via ORAL
  Filled 2013-11-24 (×4): qty 1

## 2013-11-24 MED ORDER — ONDANSETRON HCL 4 MG PO TABS: 4.0000 mg | ORAL_TABLET | Freq: Four times a day (QID) | ORAL | Status: DC | PRN

## 2013-11-24 NOTE — ED Notes (Signed)
Contact daughter Manuela Schwartz [phone] if needed

## 2013-11-24 NOTE — ED Notes (Signed)
Pt states she has been having chest pain intermittently for 3 weeks. Pt describes pain as shooting and throbbing, radiating to back and sometimes down left arm. She says pain location is just under her left breast. Also c/o swelling in feet and legs with exercise. No swelling in feet, ankles, lower legs noted att.

## 2013-11-24 NOTE — ED Notes (Signed)
Pt with left sided CP since Sunday, states she has SOB all the time, + lightheadedness, + cough, denies N/V

## 2013-11-24 NOTE — ED Notes (Signed)
Floor unable to take report at this time.

## 2013-11-24 NOTE — ED Notes (Signed)
Pt c/o legs swelling as well

## 2013-11-24 NOTE — ED Notes (Signed)
Meal tray ordered 

## 2013-11-24 NOTE — H&P (Signed)
[facility] History and Physical  QUIARA KORZENIEWSKI [id number] DOB: 10-28-1932 DOA: 11/24/2013  Referring physician:  PCP: [name]   Chief Complaint: chest pain  HPI: [name] is a 80 y.o. female with past medical history that includes CAD status post stent placement 8 months ago, hypertension, hyperlipidemia, chronic diastolic heart failure presents to the emergency department with a chief complaint of persistent intermittent chest pain and lower extremity edema. She was evaluated by Dr. Iran with cardiology who opined no initial evidence of ACS but recommended admission for observation and stress test tomorrow.  Patient reports left anterior chest pain radiating to the back intermittently for the previous 3 months. She describes the pain as sharp and states each episode only lasts "a few seconds". Associated symptoms include worsening shortness of breath with some palpitations and lower extremity edema. She reports a lower extremity edema has gradually worsened over the previous 3 weeks. She has been compliant with her Lasix. She reports the swelling is worse at the end of the day. She states she saw her cardiologist in May who at that time increased her amlodipine and her Lasix. This morning she awakened and the pain was a little worse and lasted longer. She also developed a headache. She called her PCP who recommended she come to the emergency department for evaluation.  Initial evaluation includes basic metabolic panel significant for potassium of 3.6 creatinine of 1.13 serum glucose in the 139. Complete blood count is unremarkable. ProBNP 292 and initial troponin negative. Chest x-ray with no acute cardiopulmonary disease an EKG with sinus rhythm. Emergency department she is given 1 inch nitro paste 325 mg of aspirin. At the time of my exam she is pain-free she is hemodynamically stable afebrile and not hypoxic.   Review of Systems:  10 point review of systems  completed and all systems are negative except as indicated in the history of present illness   Past Medical History  Diagnosis Date  . PVD (peripheral vascular disease)   . Other and unspecified hyperlipidemia   . Coronary atherosclerosis of native coronary artery     nonobstructive CAD by multiple catheterizations  . Panic disorder without agoraphobia   . HTN (hypertension)     Hx of it  . MI (myocardial infarction) 2006  . Internal hemorrhoids without mention of complication   . Dysphagia, unspecified(787.20)   . Microscopic colitis 2003  . GERD (gastroesophageal reflux disease)     Hx Schatzki's ring, multiple EGD/ED last 01/06/2004  . Hyperlipemia   . Thyroid disease     recent abnl TSH per pt  . S/P colonoscopy 09/27/2001    internal hemorrhoids, desc colon inflam polyp, SB BX-chronic duodenitis, colitis  . Arthritis   . Shortness of breath   . Heart disease   . Bursitis     left shoulder  . Hyperlipidemia   . Sleep disorder     obstructive  . COPD (chronic obstructive pulmonary disease)   . Complication of anesthesia   . PONV (postoperative nausea and vomiting)     'a little nausea"  . Anxiety   . Depression   . Pneumonia 12/2011  . Heart murmur     'a littel'  . Cataract    Past Surgical History  Procedure Laterality Date  . Tonsillectomy    . Unspecified area, hysterectomy  1972    partial  . Cholecystectomy  1998  . Right knee replacement  2007  . Right leg benign tumor    .  Breast lumpectomy  1998    left, benign  . Left hand surgery    . Left rotator cuff surgery    . Nasal sinus surgery    . Bladder tack  06/2010  . Cardiac catheterization    . Maloney dilation  03/16/2011    Gastritis. No H.pylori on bx. 72F maloney dilation with disruption of  occult cevical esophageal web  . Colonoscopy  03/16/2011    multiple hyperplastic colon polyps, sigmoid diverticulosis, melanosis coli  . Bladder suspension  11/09/2011    Procedure: TRANSVAGINAL TAPE (TVT)  PROCEDURE;  Surgeon: Marissa Nestle, MD;  Location: [facility];  Service: Urology;  Laterality: N/A;  . Abdominal hysterectomy    . Appendectomy    . Carpal tunnel release  1989    left  . Anterior and posterior repair      with resection of vagina  . Cardiac catheterization    . Shoulder surgery Left   . Joint replacement Right 2007  . Lumbar laminectomy/decompression microdiscectomy N/A 10/11/2012    Procedure: LUMBAR LAMINECTOMY/DECOMPRESSION MICRODISCECTOMY 2 LEVELS;  Surgeon: Floyce Stakes, MD;  Location: [facility];  Service: Neurosurgery;  Laterality: N/A;  L3-4 L4-5 Laminectomy   Social History:  reports that she quit smoking about 12 years ago. Her smoking use included Cigarettes. She has a 64 pack-year smoking history. She has never used smokeless tobacco. She reports that she does not drink alcohol or use illicit drugs. She lives with her son she is a retired Armed forces logistics/support/administrative officer. She ambulates without assistance and is independent with ADLs. Allergies  Allergen Reactions  . Phenothiazines Anaphylaxis and Hives  . Polysorbate Anaphylaxis  . Prednisone Shortness Of Breath  . Codeine Itching  . Statins Nausea And Vomiting       . Prochlorperazine Other (See Comments)    unknown  . Atorvastatin Hives  . Ofloxacin Rash  . Other Itching and Rash    "WOOL"= make skin look like it has been burned  . Penicillins Other (See Comments)    Causes redness all over.   . Pimozide Hives and Itching    Family History  Problem Relation Age of Onset  . Coronary artery disease Other     family Hx-sons  . Cancer Other   . Stroke Other     family Hx  . Hypertension Other     family Hx  . Diabetes Brother      Prior to Admission medications   Medication Sig Start Date End Date Taking? Authorizing Provider  ALPRAZolam Duanne Moron) 0.25 MG tablet Take 0.25 mg by mouth daily as needed for anxiety.    Yes Historical Provider, MD  amLODipine (NORVASC) 10 MG tablet Take 1 tablet (10 mg total) by  mouth daily. 08/20/13  Yes Herminio Commons, MD  aspirin 81 MG EC tablet Take 81 mg by mouth every morning.    Yes Historical Provider, MD  carvedilol (COREG) 12.5 MG tablet Take 1 tablet (12.5 mg total) by mouth 2 (two) times daily. 05/08/13  Yes Herminio Commons, MD  clopidogrel (PLAVIX) 75 MG tablet TAKE ONE TABLET BY MOUTH DAILY WITH BREAKFAST 11/07/13  Yes Herminio Commons, MD  CRESTOR 5 MG tablet Take 5 mg by mouth daily. 10/24/13  Yes Historical Provider, MD  DULoxetine (CYMBALTA) 60 MG capsule Take 60 mg by mouth at bedtime.    Yes Historical Provider, MD  furosemide (LASIX) 20 MG tablet Take 1 tablet (20 mg total) by mouth daily. 07/31/13  Yes Herminio Commons, MD  pantoprazole (PROTONIX) 40 MG tablet Take 40 mg by mouth every morning.    Yes Historical Provider, MD  polyethylene glycol (MIRALAX / GLYCOLAX) packet Take 17 g by mouth daily as needed. For constipation   Yes Historical Provider, MD  POTASSIUM GLUCONATE PO Take 99 mg by mouth every morning.    Yes Historical Provider, MD  RANEXA 500 MG 12 hr tablet TAKE ONE TABLET BY MOUTH TWICE DAILY 08/26/13  Yes Herminio Commons, MD  acetaminophen (TYLENOL) 500 MG tablet Take 500 mg by mouth every 8 (eight) hours as needed for moderate pain.    Historical Provider, MD  nitroGLYCERIN (NITROSTAT) 0.4 MG SL tablet Place 1 tablet (0.4 mg total) under the tongue every 5 (five) minutes as needed. For chest pain 05/29/13   Lendon Colonel, NP   Physical Exam: Filed Vitals:   11/24/13 1300 11/24/13 1315 11/24/13 1324 11/24/13 1330  BP: 130/56  130/56 135/66  Pulse: 59 63 61 59  Temp:      Resp: 21 22 22 23   Height:      Weight:      SpO2: 94% 91% 94% 96%    Wt Readings from Last 3 Encounters:  11/24/13 87.998 kg (194 lb)  08/20/13 87.998 kg (194 lb)  07/31/13 87.907 kg (193 lb 12.8 oz)    General:  Appears calm and comfortable Eyes: PERRL, normal lids, irises & conjunctiva ENT: Are clear nose without drainage oropharynx  without erythema or exudate. Mucous membranes of her mouth are pink and slightly dry Neck: no LAD, masses or thyromegaly no JVD Cardiovascular: Heart sounds are little distant but regular,+murmur no lower extremity edema Respiratory: CTA bilaterally, no w/r/r. Normal respiratory effort  Abdomen: soft, ntnd positive bowel sounds throughout no mass organomegaly noted Skin: no rash or induration seen on limited exam Musculoskeletal: grossly normal tone BUE/BLE Psychiatric: grossly normal mood and affect, speech fluent and appropriate Neurologic: grossly non-focal.speech clear facial symmetry           Labs on Admission:  Basic Metabolic Panel:  Recent Labs Lab 11/24/13 1110  NA 138  K 3.6*  CL 100  CO2 25  GLUCOSE 139*  BUN 17  CREATININE 1.13*  CALCIUM 9.2   Liver Function Tests: No results found for this basename: AST, ALT, ALKPHOS, BILITOT, PROT, ALBUMIN,  in the last 168 hours No results found for this basename: LIPASE, AMYLASE,  in the last 168 hours No results found for this basename: AMMONIA,  in the last 168 hours CBC:  Recent Labs Lab 11/24/13 1110  WBC 5.2  NEUTROABS 3.2  HGB 12.3  HCT 34.9*  MCV 89.7  PLT 257   Cardiac Enzymes: No results found for this basename: CKTOTAL, CKMB, CKMBINDEX, TROPONINI,  in the last 168 hours  BNP (last 3 results)  Recent Labs  11/24/13 1110  PROBNP 292.3   CBG: No results found for this basename: GLUCAP,  in the last 168 hours  Radiological Exams on Admission: Dg Chest Portable 1 View  11/24/2013   CLINICAL DATA:  Chest pain. Worsened last 3 weeks. Hypertension. COPD.  EXAM: PORTABLE CHEST - 1 VIEW  COMPARISON:  04/01/2013  FINDINGS: Midline trachea. Patient minimally rotated to the right. Borderline cardiomegaly. No pleural effusion or pneumothorax. Low lung volumes with resultant pulmonary interstitial prominence. Possible right upper lobe nodular density.  IMPRESSION: No acute cardiopulmonary disease.  Cannot exclude  subtle right upper lobe nodule. Consider further evaluation with PA and  lateral radiographs.   Electronically Signed   By: Abigail Miyamoto M.D.   On: 11/24/2013 11:21    EKG: Independently reviewed. SR  Assessment/Plan Principal Problem:   Chest pain: Intermittent but recurring. In patients cardiac history and risk factors will admit to telemetry for observation to rule out ACS. Will cycle cardiac enzymes and get serial EKGs. Will provide oxygen as needed nitroglycerin as needed. 2-D echo per cardiology. Evaluated by cardiology in the emergency department recommend DEXA scan in the morning. Will continue her aspirin at full dose and Plavix as given recent stent. Review indicates patient intolerant to statin. Diet management. Will obtain a lipid panel. N.p.o. past midnight. Appreciate cardiology assistance  Active Problems:   Lower extremity edema: No lower extremity edema on admission. Patient with history of chronic diastolic heart failure. Chest x-ray without edema and BNP within the limits of normal. She saw cardiologist 2 months ago at which time her Lasix was increased. She reports compliance. Will repeat 2-D echo to evaluate for worsening heart failure.    Acute kidney failure: Chart review indicates may be some component of chronic kidney disease as well. Will hold any nephrotoxins. Will gently hydrate with IV fluids. Will recheck in the morning. Monitor urine output  Chronic diastolic heart failure: See above. Evaluated by cardiology in the emergency department. Cardiology note indicates most recent LVEF evaluation by stress echo in 2013 with a baseline LVEF reported at 60%. Continue Lasix, as her intake and output obtain daily weights await repeat echo results.    CAD, NATIVE VESSEL - PCI + DES to left circumflex 05/14/13; see #1. Aspirin and Plavix for dual antiplatelet therapy for at least one year per cardiology note dated May 6. Coating to notes intolerance to statins.     HYPERLIPIDEMIA-MIXED: Unable to tolerate statins. Plan for control is through diet. Will obtain a lipid panel      GASTROESOPHAGEAL REFLUX DISEASE, CHRONIC: Appears to be stable at baseline. Will continue her PPI    HYPERTENSION, HX OF: Controlled on admission. Chart review indicates amlodipine and Lasix both increased in may of this year per cardiology. Per his note optimal blood pressure control is of prime importance to manage CHF.  Abnormal chest xray: No infiltrate or edema noted. However report includes "Cannot exclude subtle right upper lobe nodule. Consider further evaluation with PA and lateral radiographs". May be followed up on an outpatient basis  Anxiety: appears stable at baseline    Dr Harl Bowie cardiology  Code Status: full DVT Prophylaxis: Family Communication: daughter at bedside Disposition Plan: home hopefully tomorrow  Time spent: 78 minutes  [facility] Pager [phone]  **Disclaimer: This note may have been dictated with voice recognition software. Similar sounding words can inadvertently be transcribed and this note may contain transcription errors which may not have been corrected upon publication of note.**

## 2013-11-24 NOTE — ED Provider Notes (Signed)
CSN: [id number]     Arrival date & time 11/24/13  1046 History  This chart was scribed for Sharyon Cable, MD by Ludger Nutting, ED Scribe. This patient was seen in room [facility] and the patient's care was started 11:12 AM.    Chief Complaint  Patient presents with  . Chest Pain    Patient is a 80 y.o. female presenting with chest pain. The history is provided by the patient. No language interpreter was used.  Chest Pain Pain location:  L chest Pain quality: radiating   Pain radiates to:  Mid back and L arm Pain radiates to the back: yes   Pain severity:  Mild Duration:  3 weeks Timing:  Intermittent Progression:  Unchanged Chronicity:  New Associated symptoms: fatigue and shortness of breath   Associated symptoms: no fever     HPI Comments: DESHANTE AHUMADA is a 80 y.o. female with past medical history of CAD, PCD, HTN, MI, HLD, COPD who presents to the Emergency Department complaining of 3 weeks of intermittent episodes of left sided chest pain that radiates to the back and left arm lasting seconds at a time. She has leg swelling from bilateral ankles to knees for the past 3 weeks as well. She has associated SOB that is present when standing and walking. She states the pain is not alleviated with rest. She has spoken with her PCP who advised she come to the ED for concern of CHF. She denies history of PE/DVT. She denies recent falls or lightheadedness.   Past Medical History  Diagnosis Date  . PVD (peripheral vascular disease)   . Other and unspecified hyperlipidemia   . Coronary atherosclerosis of native coronary artery     nonobstructive CAD by multiple catheterizations  . Panic disorder without agoraphobia   . HTN (hypertension)     Hx of it  . MI (myocardial infarction) 2006  . Internal hemorrhoids without mention of complication   . Dysphagia, unspecified(787.20)   . Microscopic colitis 2003  . GERD (gastroesophageal reflux disease)     Hx Schatzki's ring, multiple  EGD/ED last 01/06/2004  . Hyperlipemia   . Thyroid disease     recent abnl TSH per pt  . S/P colonoscopy 09/27/2001    internal hemorrhoids, desc colon inflam polyp, SB BX-chronic duodenitis, colitis  . Arthritis   . Shortness of breath   . Heart disease   . Bursitis     left shoulder  . Hyperlipidemia   . Sleep disorder     obstructive  . COPD (chronic obstructive pulmonary disease)   . Complication of anesthesia   . PONV (postoperative nausea and vomiting)     'a little nausea"  . Anxiety   . Depression   . Pneumonia 12/2011  . Heart murmur     'a littel'  . Cataract    Past Surgical History  Procedure Laterality Date  . Tonsillectomy    . Unspecified area, hysterectomy  1972    partial  . Cholecystectomy  1998  . Right knee replacement  2007  . Right leg benign tumor    . Breast lumpectomy  1998    left, benign  . Left hand surgery    . Left rotator cuff surgery    . Nasal sinus surgery    . Bladder tack  06/2010  . Cardiac catheterization    . Maloney dilation  03/16/2011    Gastritis. No H.pylori on bx. 5F maloney dilation with disruption of  occult  cevical esophageal web  . Colonoscopy  03/16/2011    multiple hyperplastic colon polyps, sigmoid diverticulosis, melanosis coli  . Bladder suspension  11/09/2011    Procedure: TRANSVAGINAL TAPE (TVT) PROCEDURE;  Surgeon: Marissa Nestle, MD;  Location: [facility];  Service: Urology;  Laterality: N/A;  . Abdominal hysterectomy    . Appendectomy    . Carpal tunnel release  1989    left  . Anterior and posterior repair      with resection of vagina  . Cardiac catheterization    . Shoulder surgery Left   . Joint replacement Right 2007  . Lumbar laminectomy/decompression microdiscectomy N/A 10/11/2012    Procedure: LUMBAR LAMINECTOMY/DECOMPRESSION MICRODISCECTOMY 2 LEVELS;  Surgeon: Floyce Stakes, MD;  Location: [place] [facility];  Service: Neurosurgery;  Laterality: N/A;  L3-4 L4-5 Laminectomy   Family History  Problem  Relation Age of Onset  . Coronary artery disease Other     family Hx-sons  . Cancer Other   . Stroke Other     family Hx  . Hypertension Other     family Hx  . Diabetes Brother    History  Substance Use Topics  . Smoking status: Former Smoker -- 1.00 packs/day for 64 years    Types: Cigarettes    Quit date: 11/17/2001  . Smokeless tobacco: Never Used     Comment: no tobacco  . Alcohol Use: No   OB History   Grav Para Term Preterm Abortions TAB SAB Ect Mult Living   8 6   2     5      Review of Systems  Constitutional: Positive for fatigue. Negative for fever.  Respiratory: Positive for shortness of breath.   Cardiovascular: Positive for chest pain and leg swelling.  Neurological: Negative for light-headedness.  All other systems reviewed and are negative.     Allergies  Phenothiazines; Polysorbate; Prednisone; Codeine; Statins; Prochlorperazine; Atorvastatin; Ofloxacin; Other; Penicillins; and Pimozide  Home Medications   Prior to Admission medications   Medication Sig Start Date End Date Taking? Authorizing Provider  acetaminophen (TYLENOL) 500 MG tablet Take 500 mg by mouth every 8 (eight) hours as needed for moderate pain.    Historical Provider, MD  ALPRAZolam Duanne Moron) 0.25 MG tablet Take 0.25 mg by mouth daily.    Historical Provider, MD  amLODipine (NORVASC) 10 MG tablet Take 1 tablet (10 mg total) by mouth daily. 08/20/13   Herminio Commons, MD  aspirin 81 MG EC tablet Take 81 mg by mouth every morning.     Historical Provider, MD  carvedilol (COREG) 12.5 MG tablet Take 1 tablet (12.5 mg total) by mouth 2 (two) times daily. 05/08/13   Herminio Commons, MD  clopidogrel (PLAVIX) 75 MG tablet TAKE ONE TABLET BY MOUTH DAILY WITH BREAKFAST 11/07/13   Herminio Commons, MD  DULoxetine (CYMBALTA) 60 MG capsule Take 60 mg by mouth at bedtime.     Historical Provider, MD  furosemide (LASIX) 20 MG tablet Take 1 tablet (20 mg total) by mouth daily. 07/31/13   Herminio Commons, MD  Multiple Vitamins-Minerals (HAIR/SKIN/NAILS) TABS Take 1 tablet by mouth daily.    Historical Provider, MD  nitroGLYCERIN (NITROSTAT) 0.4 MG SL tablet Place 1 tablet (0.4 mg total) under the tongue every 5 (five) minutes as needed. For chest pain 05/29/13   Lendon Colonel, NP  pantoprazole (PROTONIX) 40 MG tablet Take 40 mg by mouth every morning.     Historical Provider, MD  polyethylene glycol (  MIRALAX / GLYCOLAX) packet Take 17 g by mouth daily as needed. For constipation    Historical Provider, MD  POTASSIUM GLUCONATE PO Take 500 mg by mouth every morning.    Historical Provider, MD  RANEXA 500 MG 12 hr tablet TAKE ONE TABLET BY MOUTH TWICE DAILY 08/26/13   Herminio Commons, MD  traMADol (ULTRAM) 50 MG tablet Take 50 mg by mouth 2 (two) times daily.    Historical Provider, MD   BP 158/67  Temp(Src) 97.6 F (36.4 C)  Resp 22  Ht 5\' 1"  (1.549 m)  Wt 194 lb (87.998 kg)  BMI 36.67 kg/m2  SpO2 98% Physical Exam  Nursing note and vitals reviewed.  CONSTITUTIONAL: Well developed/well nourished HEAD: Normocephalic/atraumatic EYES: EOMI/PERRL ENMT: Mucous membranes moist NECK: supple no meningeal signs SPINE:entire spine nontender CV: S1/S2 noted, no murmurs/rubs/gallops noted LUNGS: crackles bilaterally.  ABDOMEN: soft, nontender, no rebound or guarding GU:no cva tenderness NEURO: Pt is awake/alert, moves all extremitiesx4 EXTREMITIES: pulses normal, full ROM. No significant edema needed  SKIN: warm, color normal PSYCH: no abnormalities of mood noted   ED Course  Procedures   DIAGNOSTIC STUDIES: Oxygen Saturation is 98% on RA, normal by my interpretation.    COORDINATION OF CARE: 11:19 AM Discussed treatment plan with pt at bedside and pt agreed to plan.  1:15 PM D/w dr branch,  He will evaluate patient in the ED Pt currently stable at this time 1:58 PM Dr branch recommends admit D/w dr Sarajane Jews, will admit to TELE Pt is currently CP free BP 135/66   Pulse 59  Temp(Src) 97.6 F (36.4 C)  Resp 23  Ht 5\' 1"  (1.549 m)  Wt 194 lb (87.998 kg)  BMI 36.67 kg/m2  SpO2 96%  Labs Review Labs Reviewed  BASIC METABOLIC PANEL - Abnormal; Notable for the following:    Potassium 3.6 (*)    Glucose, Bld 139 (*)    Creatinine, Ser 1.13 (*)    GFR calc non Af Amer 45 (*)    GFR calc Af Amer 52 (*)    All other components within normal limits  CBC WITH DIFFERENTIAL - Abnormal; Notable for the following:    HCT 34.9 (*)    All other components within normal limits  PRO B NATRIURETIC PEPTIDE  I-STAT TROPOININ, ED    Imaging Review Dg Chest Portable 1 View  11/24/2013   CLINICAL DATA:  Chest pain. Worsened last 3 weeks. Hypertension. COPD.  EXAM: PORTABLE CHEST - 1 VIEW  COMPARISON:  04/01/2013  FINDINGS: Midline trachea. Patient minimally rotated to the right. Borderline cardiomegaly. No pleural effusion or pneumothorax. Low lung volumes with resultant pulmonary interstitial prominence. Possible right upper lobe nodular density.  IMPRESSION: No acute cardiopulmonary disease.  Cannot exclude subtle right upper lobe nodule. Consider further evaluation with PA and lateral radiographs.   Electronically Signed   By: Abigail Miyamoto M.D.   On: 11/24/2013 11:21     EKG Interpretation   Date/Time:  Monday November 24 2013 10:52:57 EDT Ventricular Rate:  69 PR Interval:  144 QRS Duration: 89 QT Interval:  444 QTC Calculation: 476 R Axis:     Text Interpretation:  Sinus rhythm Low voltage, precordial leads No  significant change since last tracing Confirmed by Christy Gentles  MD, Elenore Rota  [phone]) on 11/24/2013 11:02:25 AM      MDM   Final diagnoses:  Chest pain, rule out acute myocardial infarction    Nursing notes including past medical history and social history reviewed  and considered in documentation xrays reviewed and considered Labs/vital reviewed and considered Previous records reviewed and considered   I personally performed the services  described in this documentation, which was scribed in my presence. The recorded information has been reviewed and is accurate.      Sharyon Cable, MD 11/24/13 [phone]

## 2013-11-24 NOTE — Consult Note (Signed)
Primary cardiologist:Dr Bronson Ing Consulting cardiologist: Dr Harl Bowie  Clinical Summary Ms. Hugie is a 80 y.o.female history of CAD with prior stent to LCX Jan 2015 in setting of angina. Cath at that time showed LM patent LM, LAD 30-40% prox, LCX 70% prox to OM1, mid LCX 50%, RCA small non-dom with mid 50%. FFR was 0.8, given her symptoms it was decided to intervene on LCX. Most recent LVEF evaluation by stress echo in 2013, baseline LVEF reported at 60%. She reports resolution of chest pain after stent placement, however develoed a different sort of pain approx 3 months ago. Last clinic visit 08/2013 with Dr Bronson Ing complained of chest pain, was started on norvasc as additional antianginal. She states this pain has increased in frequency and severity over the last few weeks. Describes a sharp pain under left breast radiating to back, that can evolve into a pressure. 5/10, can have some diaphoresis associated, +palpitations. Lasts for just a few seconds, happens intermittently throughout the day. Can occur at rest or with exertion. Not positional. Has tried NG at home without much benefit. Also reports over this time increased DOE, and over the last 3 weeks some LE edema.     Pro-BNP 292, Cr 1.13, GFR 45,  Trop neg x 1. CXR no acute process. EKG sinus rhythm with no ischemic changes.    Allergies  Allergen Reactions  . Phenothiazines Anaphylaxis and Hives  . Polysorbate Anaphylaxis  . Prednisone Shortness Of Breath  . Codeine Itching  . Statins Nausea And Vomiting       . Prochlorperazine Other (See Comments)    unknown  . Atorvastatin Hives  . Ofloxacin Rash  . Other Itching and Rash    "WOOL"= make skin look like it has been burned  . Penicillins Other (See Comments)    Causes redness all over.   . Pimozide Hives and Itching    Medications Scheduled Medications:    Infusions:    PRN Medications:        Past Medical History  Diagnosis Date  . PVD (peripheral  vascular disease)   . Other and unspecified hyperlipidemia   . Coronary atherosclerosis of native coronary artery     nonobstructive CAD by multiple catheterizations  . Panic disorder without agoraphobia   . HTN (hypertension)     Hx of it  . MI (myocardial infarction) 2006  . Internal hemorrhoids without mention of complication   . Dysphagia, unspecified(787.20)   . Microscopic colitis 2003  . GERD (gastroesophageal reflux disease)     Hx Schatzki's ring, multiple EGD/ED last 01/06/2004  . Hyperlipemia   . Thyroid disease     recent abnl TSH per pt  . S/P colonoscopy 09/27/2001    internal hemorrhoids, desc colon inflam polyp, SB BX-chronic duodenitis, colitis  . Arthritis   . Shortness of breath   . Heart disease   . Bursitis     left shoulder  . Hyperlipidemia   . Sleep disorder     obstructive  . COPD (chronic obstructive pulmonary disease)   . Complication of anesthesia   . PONV (postoperative nausea and vomiting)     'a little nausea"  . Anxiety   . Depression   . Pneumonia 12/2011  . Heart murmur     'a littel'  . Cataract     Past Surgical History  Procedure Laterality Date  . Tonsillectomy    . Unspecified area, hysterectomy  1972    partial  . Cholecystectomy  1998  . Right knee replacement  2007  . Right leg benign tumor    . Breast lumpectomy  1998    left, benign  . Left hand surgery    . Left rotator cuff surgery    . Nasal sinus surgery    . Bladder tack  06/2010  . Cardiac catheterization    . Maloney dilation  03/16/2011    Gastritis. No H.pylori on bx. 58F maloney dilation with disruption of  occult cevical esophageal web  . Colonoscopy  03/16/2011    multiple hyperplastic colon polyps, sigmoid diverticulosis, melanosis coli  . Bladder suspension  11/09/2011    Procedure: TRANSVAGINAL TAPE (TVT) PROCEDURE;  Surgeon: Marissa Nestle, MD;  Location: [facility];  Service: Urology;  Laterality: N/A;  . Abdominal hysterectomy    . Appendectomy    .  Carpal tunnel release  1989    left  . Anterior and posterior repair      with resection of vagina  . Cardiac catheterization    . Shoulder surgery Left   . Joint replacement Right 2007  . Lumbar laminectomy/decompression microdiscectomy N/A 10/11/2012    Procedure: LUMBAR LAMINECTOMY/DECOMPRESSION MICRODISCECTOMY 2 LEVELS;  Surgeon: Floyce Stakes, MD;  Location: [facility];  Service: Neurosurgery;  Laterality: N/A;  L3-4 L4-5 Laminectomy    Family History  Problem Relation Age of Onset  . Coronary artery disease Other     family Hx-sons  . Cancer Other   . Stroke Other     family Hx  . Hypertension Other     family Hx  . Diabetes Brother     Social History Ms. Hardwell reports that she quit smoking about 12 years ago. Her smoking use included Cigarettes. She has a 64 pack-year smoking history. She has never used smokeless tobacco. Ms. Polce reports that she does not drink alcohol.  Review of Systems CONSTITUTIONAL: No weight loss, fever, chills, weakness or fatigue.  HEENT: Eyes: No visual loss, blurred vision, double vision or yellow sclerae. No hearing loss, sneezing, congestion, runny nose or sore throat.  SKIN: No rash or itching.  CARDIOVASCULAR:per HPI RESPIRATORY: per HPI  GASTROINTESTINAL: No anorexia, nausea, vomiting or diarrhea. No abdominal pain or blood.  GENITOURINARY: no polyuria, no dysuria NEUROLOGICAL: No headache, dizziness, syncope, paralysis, ataxia, numbness or tingling in the extremities. No change in bowel or bladder control.  MUSCULOSKELETAL: + back pain HEMATOLOGIC: No anemia, bleeding or bruising.  LYMPHATICS: No enlarged nodes. No history of splenectomy.  PSYCHIATRIC: No history of depression or anxiety.      Physical Examination Blood pressure 158/67, temperature 97.6 F (36.4 C), resp. rate 22, height 5\' 1"  (1.549 m), weight 194 lb (87.998 kg), SpO2 98.00%. No intake or output data in the 24 hours ending 11/24/13 1315  HEENT: sclera  clear  Cardiovascular: RRR, 2/6 systolic murmur RUSB  Respiratory: clear anteriorally  GI: abdomen soft, NT, ND  MSK: LEs are warm, no edema  Neuro: no focal deficits  Psych: appropriate affect   Lab Results  Basic Metabolic Panel:  Recent Labs Lab 11/24/13 1110  NA 138  K 3.6*  CL 100  CO2 25  GLUCOSE 139*  BUN 17  CREATININE 1.13*  CALCIUM 9.2    Liver Function Tests: No results found for this basename: AST, ALT, ALKPHOS, BILITOT, PROT, ALBUMIN,  in the last 168 hours  CBC:  Recent Labs Lab 11/24/13 1110  WBC 5.2  NEUTROABS 3.2  HGB 12.3  HCT 34.9*  MCV  89.7  PLT 257    Cardiac Enzymes: No results found for this basename: CKTOTAL, CKMB, CKMBINDEX, TROPONINI,  in the last 168 hours  BNP: No components found with this basename: POCBNP,     Imaging Jan 2015 Cath PROCEDURAL FINDINGS  Hemodynamics:  AO 116/54  LV 115/13  Coronary angiography:  Coronary dominance: right  Left mainstem: The left main is moderately calcified. The vessel has no significant obstructive disease. Divides into the LAD and left circumflex.  Left anterior descending (LAD): The LAD is moderately calcified. The vessel has 30-40% proximal stenosis. There is a shelf like plaque present. This is unchanged from the 2010 cardiac cath study. The first diagonal is patent without significant stenosis. The mid LAD is patent with minor diffuse irregularity. The distal LAD is small in caliber.  Left circumflex (LCx): The left circumflex is a dominant vessel. Proximally there is mild to moderate calcification. There is a hypodense 70% stenosis before the origin of the first OM. There is diffuse 50% stenosis just proximal to the hypodense area involving the circumflex nearly back to its ostium. The mid vessel has a stable eccentric 50% stenosis. Distally the OM and PDA branches are patent  Right coronary artery (RCA): This is a small, nondominant vessel. There was 70% ostial stenosis. The mid  vessel has 50% stenosis. There is moderate calcification present.  Left ventriculography: Deferred  PCI Procedure Note: Following the diagnostic procedure, the decision was made to proceed with pressure wire analysis of the left circumflex. Her films were compared to her previous study. There is a new, moderate hypodense plaque in the proximal circumflex with 70% stenosis. There was no significant stenosis in this area on the previous study. There is a stable eccentric plaque in the mid circumflex that had not changed since the previous study. A 5 [place] guide catheter was utilized. Angiomax was used for anticoagulation. A Verrata pressure wire was used to cross the lesion. The wire had been normalized at the guide catheter tip. Intravenous adenosine was administered. The result at peak hyperemia was 0.80. Considering her significant angina RA on maximal medical therapy, I elected to proceed with PCI. The lesion was predilated with a 2.5 balloon. The lesion was then stented with a 3.0 x 24 mm Promus Premier drug-eluting stent. The stent was postdilated with a 3.0 mm noncompliant balloon. Following PCI, there was 0% residual stenosis and TIMI-3 flow. Final angiography confirmed an excellent result. Femoral hemostasis was achieved with a Perclose. The patient tolerated the PCI procedure well. There were no immediate procedural complications. The patient was transferred to the post catheterization recovery area for further monitoring.  PCI Data:  Vessel - LCx/Segment - prox  Percent Stenosis (pre) 70  TIMI-flow 3  Stent 3.0x24 mm Promus DES  Percent Stenosis (post) 0  TIMI-flow (post) 3  Final Conclusions:  1. mild nonobstructive stenosis of the proximal LAD, stable from the previous study  2. Moderately severe stenosis of the proximal left circumflex with positive pressure wire analysis, treated successfully with PCI using a drug-eluting stent  3. Moderately severe stenosis of a small, nondominant RCA.    Recommendations: Continue aggressive medical therapy. The patient will need aspirin and Plavix for at least 12 months. She does have diffuse residual CAD in may continue to have some angina, but I would anticipate this would significantly improve her symptoms.   Impression/Recommendations 1. CAD - hx of DES to LCX Jan 2015 - reports increasing chest pain, fairly mixed characteristics for cardiac cause.  -  no evidence of ACS at this time, initial EKG and troponin unremarkable - given her history, would recommend observation overnight with serial EKG and enzymes - plan for echo and Lexiscan in AM - at this time do not see strong indication for anticoagulation, if develops EKG changes or positive trops would start then - continue home cardiac medications, including DAPT with asa and plavix given recent stent - intolerant to statins per notes - echo and stress ordered, please make NPO at midnight on admit orders   2. Chronic diastolic heart failure - will repeat echo - reports worsening DOE, LE edema. Does not appear hypervolemic by exam, CXR, or BNP - f/u repeat echo results      Carlyle Dolly, M.D., F.A.C.C.

## 2013-11-24 NOTE — H&P (Signed)
Patient seen, independently examined and chart reviewed. I agree with exam, assessment and plan discussed with Dyanne Carrel, NP.  80 year old woman history of coronary artery disease and stent placement who describes a several week history of intermittent transient chest pain, central with radiation to her left arm, associated with shortness of breath. No specific aggravating or alleviating factors. She has had more dyspnea on exertion. She has also been noticing bilateral lower extremity edema which is unusual for her. She was admitted for further evaluation of chest pain.  PMH Includes coronary artery disease with stent placement  Objective: Afebrile, vital signs stable. No hypoxia.  Gen. Appears calm and comfortable.  Psych. Alert. Speech fluent and clear.  Eyes. Pupils round, equal.  Cardiovascular.  regular rate and rhythm. No murmur, rub or gallop. No pedal edema. Some edema around both knees.  Respiratory.  clear to auscultation bilaterally. No wheezes, rales or rhonchi. Normal respiratory effort.  Skin.  appears grossly unremarkable.  Musculoskeletal.  grossly unremarkable  Neurologic.  nonfocal   Basic metabolic panel unremarkable. Initial troponin, BNP unremarkable. CBC unremarkable. Chest x-ray no acute changes. EKG was normal sinus rhythm, no acute changes.  Pleasant 80 year old woman with several week history of chest pain, dyspnea on exertion and intermittent lower extremity edema. Some typical and atypical features for cardiac chest pain. Pain observation, rule out ACS, 2-D echocardiogram and nuclear medicine scans per cardiology. Currently has no lower extremity edema of not accept first some edema around the knees. Significance unclear.  Murray Hodgkins, MD [facility] [phone]

## 2013-11-25 ENCOUNTER — Observation Stay (HOSPITAL_COMMUNITY): Payer: Medicare Other

## 2013-11-25 ENCOUNTER — Encounter (HOSPITAL_COMMUNITY): Payer: Self-pay

## 2013-11-25 DIAGNOSIS — R0602 Shortness of breath: Secondary | ICD-10-CM

## 2013-11-25 DIAGNOSIS — R5383 Other fatigue: Secondary | ICD-10-CM

## 2013-11-25 DIAGNOSIS — R5381 Other malaise: Secondary | ICD-10-CM | POA: Diagnosis not present

## 2013-11-25 DIAGNOSIS — I359 Nonrheumatic aortic valve disorder, unspecified: Secondary | ICD-10-CM

## 2013-11-25 DIAGNOSIS — R079 Chest pain, unspecified: Secondary | ICD-10-CM | POA: Diagnosis not present

## 2013-11-25 DIAGNOSIS — R0789 Other chest pain: Secondary | ICD-10-CM | POA: Diagnosis not present

## 2013-11-25 DIAGNOSIS — E785 Hyperlipidemia, unspecified: Secondary | ICD-10-CM | POA: Diagnosis not present

## 2013-11-25 LAB — URINALYSIS, ROUTINE W REFLEX MICROSCOPIC
BILIRUBIN URINE: NEGATIVE
Glucose, UA: NEGATIVE mg/dL
Ketones, ur: NEGATIVE mg/dL
Leukocytes, UA: NEGATIVE
Nitrite: NEGATIVE
PH: 5.5 (ref 5.0–8.0)
Protein, ur: NEGATIVE mg/dL
Specific Gravity, Urine: <1.005 — ABNORMAL LOW (ref 1.005–1.030)
Urobilinogen, UA: 0.2 mg/dL (ref 0.0–1.0)

## 2013-11-25 LAB — BASIC METABOLIC PANEL
Anion gap: 13 (ref 5–15)
BUN: 18 mg/dL (ref 6–23)
CALCIUM: 8.9 mg/dL (ref 8.4–10.5)
CO2: 25 mEq/L (ref 19–32)
Chloride: 105 mEq/L (ref 96–112)
Creatinine, Ser: 1.15 mg/dL — ABNORMAL HIGH (ref 0.50–1.10)
GFR calc Af Amer: 51 mL/min — ABNORMAL LOW (ref >90)
GFR calc non Af Amer: 44 mL/min — ABNORMAL LOW (ref >90)
GLUCOSE: 115 mg/dL — AB (ref 70–99)
Potassium: 3.4 mEq/L — ABNORMAL LOW (ref 3.7–5.3)
Sodium: 143 mEq/L (ref 137–147)

## 2013-11-25 LAB — TROPONIN I: Troponin I: <0.3 ng/mL (ref <0.30)

## 2013-11-25 LAB — URINE MICROSCOPIC-ADD ON

## 2013-11-25 LAB — HEMOGLOBIN A1C
Hgb A1c MFr Bld: 5.9 % — ABNORMAL HIGH (ref <5.7)
Mean Plasma Glucose: 123 mg/dL — ABNORMAL HIGH (ref <117)

## 2013-11-25 MED ORDER — TECHNETIUM TC 99M SESTAMIBI GENERIC - CARDIOLITE
10.0000 | Freq: Once | INTRAVENOUS | Status: AC | PRN
  Administered 2013-11-25: 10 via INTRAVENOUS

## 2013-11-25 MED ORDER — SODIUM CHLORIDE 0.9 % IV SOLN
250.0000 mL | INTRAVENOUS | Status: DC | PRN
  Administered 2013-11-25: 1000 mL via INTRAVENOUS

## 2013-11-25 MED ORDER — REGADENOSON 0.4 MG/5ML IV SOLN
INTRAVENOUS | Status: AC
  Administered 2013-11-25: 0.4 mg via INTRAVENOUS
  Filled 2013-11-25: qty 5

## 2013-11-25 MED ORDER — TECHNETIUM TC 99M SESTAMIBI - CARDIOLITE
30.0000 | Freq: Once | INTRAVENOUS | Status: AC | PRN
  Administered 2013-11-25: 10:00:00 30 via INTRAVENOUS

## 2013-11-25 MED ORDER — POTASSIUM CHLORIDE CRYS ER 20 MEQ PO TBCR
40.0000 meq | EXTENDED_RELEASE_TABLET | Freq: Once | ORAL | Status: AC
  Administered 2013-11-25: 40 meq via ORAL
  Filled 2013-11-25: qty 2

## 2013-11-25 MED ORDER — SODIUM CHLORIDE 0.9 % IJ SOLN
INTRAMUSCULAR | Status: AC
  Administered 2013-11-25: 10 mL via INTRAVENOUS
  Filled 2013-11-25: qty 10

## 2013-11-25 NOTE — Care Management Note (Addendum)
    Page 1 of 1   11/26/2013     9:41:03 AM CARE MANAGEMENT NOTE 11/26/2013  Patient:  [name], [name]   Account Number:  [id number]  Date Initiated:  11/25/2013  Documentation initiated by:  Jolene Provost  Subjective/Objective Assessment:   Patient admitted with chest pain. Patient from home, lives with son and is independent with ADL's. Patient has cane and walker at home that she does not require. Patient has no HH services but has a daughter that lives near by who is an aid     Action/Plan:   and can help if necessary. patient plans to discharge home with self care. No CM needs noted this time.   Anticipated DC Date:     Anticipated DC Plan:  [place]  CM consult      [facility]  HOME HEALTH   Choice offered to / List presented to:  C-1 Patient        [place] arranged  HH-2 PT      Status of service:  Completed, signed off Medicare Important Message given?   (If response is "NO", the following Medicare IM given date fields will be blank) Date Medicare IM given:   Medicare IM given by:   Date Additional Medicare IM given:   Additional Medicare IM given by:    Discharge Disposition:  [place]  Per UR Regulation:    If discussed at Long Length of Stay Meetings, dates discussed:    Comments:  11/26/2013 0930 Jolene Provost, RN, MSN, PCCN Patient to discharge home today with PT services. Patient has requested to continue PT at OP facility. She has requested to go to [facility] in [place] because she has gone to them in the past, it is near her home and "they like her". Patient says she wants to do OP as opposed to [facility] because it is a good way for her to get out of the house. Patient lives with son and has children to drive her to PT appointments. Patient has no DME needs at this time. No further CM needs identified at this time.  11/25/2013 [place], RN, MSN, [facility]

## 2013-11-25 NOTE — Progress Notes (Signed)
Patient seen and examined by me.  Await echo- please see note by Dyanne Carrel- check TSH still c/o being weak.  PT eval- ? Home health need  Basheer Molchan DO

## 2013-11-25 NOTE — Progress Notes (Signed)
UR Completed.  Fayne Mcguffee Jane [phone] 11/25/2013  

## 2013-11-25 NOTE — Progress Notes (Signed)
No ischemia on Lexiscan. Her LVEF was low, this may be related to a processing issue. Echo will be done later this afternoon to clarify her LVEF.  Zandra Abts MD

## 2013-11-25 NOTE — Progress Notes (Signed)
Patient ID: [name], female   DOB: 1932/05/03, 80 y.o.   MRN: [id number]     Subjective:    +chest pain overnight  Objective:   Temp:  [97.6 F (36.4 C)-98.2 F (36.8 C)] 98.2 F (36.8 C) (08/11 0504) Pulse Rate:  [58-66] 63 (08/11 0504) Resp:  [13-23] 20 (08/11 0504) BP: (124-158)/(40-72) 126/49 mmHg (08/11 0504) SpO2:  [91 %-99 %] 94 % (08/11 0504) Weight:  [190 lb 6.4 oz (86.365 kg)-194 lb (87.998 kg)] 190 lb 6.4 oz (86.365 kg) (08/11 0504) Last BM Date: 11/23/13  Filed Weights   11/24/13 1525 11/24/13 1537 11/25/13 0504  Weight: 194 lb (87.998 kg) 193 lb 9 oz (87.8 kg) 190 lb 6.4 oz (86.365 kg)    Intake/Output Summary (Last 24 hours) at 11/25/13 0907 Last data filed at 11/25/13 0636  Gross per 24 hour  Intake 1172.5 ml  Output   1050 ml  Net  122.5 ml    Exam:  General: NAD  Resp: CTAB  Cardiac: RRR, no m/r/g, no JVD  GI: abdomen soft, NT, ND  MSK:no LE edema  Neuro: no focal deficits  Psych: appropriate affect  Lab Results:  Basic Metabolic Panel:  Recent Labs Lab 11/24/13 1110 11/25/13 0545  NA 138 143  K 3.6* 3.4*  CL 100 105  CO2 25 25  GLUCOSE 139* 115*  BUN 17 18  CREATININE 1.13* 1.15*  CALCIUM 9.2 8.9  MG 1.9  --     Liver Function Tests: No results found for this basename: AST, ALT, ALKPHOS, BILITOT, PROT, ALBUMIN,  in the last 168 hours  CBC:  Recent Labs Lab 11/24/13 1110  WBC 5.2  HGB 12.3  HCT 34.9*  MCV 89.7  PLT 257    Cardiac Enzymes:  Recent Labs Lab 11/24/13 1739 11/24/13 2325  TROPONINI <0.30 <0.30    BNP:  Recent Labs  11/24/13 1110  PROBNP 292.3    Coagulation: No results found for this basename: INR,  in the last 168 hours  ECG:   Medications:   Scheduled Medications: . amLODipine  10 mg Oral Daily  . aspirin EC  325 mg Oral Daily  . carvedilol  12.5 mg Oral BID  . clopidogrel  75 mg Oral Daily  . DULoxetine  60 mg Oral QHS  . enoxaparin (LOVENOX) injection  40 mg  Subcutaneous Q24H  . furosemide  20 mg Oral Daily  . pantoprazole  40 mg Oral q morning - 10a  . ranolazine  500 mg Oral BID  . regadenoson  0.4 mg Intravenous Once  . senna  1 tablet Oral BID  . sodium chloride  3 mL Intravenous Q12H  . sodium chloride  3 mL Intravenous Q12H     Infusions:     PRN Medications:  sodium chloride, acetaminophen, acetaminophen, ALPRAZolam, alum & mag hydroxide-simeth, bisacodyl, HYDROcodone-acetaminophen, morphine injection, ondansetron (ZOFRAN) IV, ondansetron, polyethylene glycol, sodium chloride, technetium sestamibi, traZODone     Assessment/Plan   1. CAD/chest pain - hx of DES to LCX Jan 2015  - reports increasing chest pain, fairly mixed characteristics for cardiac cause.  - no evidence of ACS at this time by EKGs or enzymes - f/u echo and Lexiscan today  - intolerant to statins per notes      2. Chronic diastolic heart failure  - will repeat echo  - reports worsening DOE, LE edema. Does not appear hypervolemic by exam, CXR, or BNP  - f/u repeat echo results  Carlyle Dolly, M.D., F.A.C.C.

## 2013-11-25 NOTE — Progress Notes (Signed)
Stress Lab Nurses Notes - [name]  DAIRY WISSER 11/25/2013 Reason for doing test: CAD, Chest Pain and Dyspnea Type of test: Wille Glaser Nurse performing test: Gerrit Halls, RN Nuclear Medicine Tech: Redmond Baseman Echo Tech: Not Applicable MD performing test: Jefm Bryant MD: Sasser Test explained and consent signed: Yes.   IV started: Saline lock flushed, No redness or edema and Saline lock from floor Symptoms: slight SOB & nausea Treatment/Intervention: None Reason test stopped: protocol completed After recovery IV was: No redness or edema Patient to return to Nuc. Med at : Patient discharged: Transported back to room 309 via wc Patient's Condition upon discharge was: stable Comments: During test  BP 145/61 & HR 71.  Recovery BP 147/63 & HR 64.  Symptoms resolved in recovery. Geanie Cooley T

## 2013-11-25 NOTE — Progress Notes (Signed)
  Echocardiogram 2D Echocardiogram has been performed.  [place], [place] 11/25/2013, 4:22 PM

## 2013-11-25 NOTE — Progress Notes (Signed)
Echo shows normal LVEF, 65-70%. Combined with her negative stress test, there is no clear cardiac cause for her symptoms. No further cardiac workup planned at this time. Reasonable discharge tonight with follow up with Dr Bronson Ing or NP Purcell Nails in 2-3 weeks. She is already on 3 antianginals, I do not see benefit at starting another as it does not appear her pain is ischemic.   Zandra Abts MD

## 2013-11-25 NOTE — Progress Notes (Signed)
[facility] PROGRESS NOTE  [name] [id number] DOB: Sep 07, 1932 DOA: 11/24/2013 PCP: [name]    Summary: 80 year old woman history of coronary artery disease and stent placement who describes a several week history of intermittent transient chest pain, central with radiation to her left arm, associated with shortness of breath. No specific aggravating or alleviating factors. She has had more dyspnea on exertion. She has also been noticing bilateral lower extremity edema which is unusual for her. She was admitted for further evaluation of chest pain   Assessment/Plan: Principal Problem:  Chest pain: Intermittent but recurring. Cardiac enzymes negative. EKGs with no acute changes. Await 2-D echo results. DEXA scan this morning. Awaiting results.  Continues with intermittent chest pain. Will continue her aspirin at full dose and Plavix as given recent stent. Review indicates patient intolerant to statin. Await cardiology recommendations Active Problems:  Lower extremity edema: No lower extremity edema on admission. Patient with history of chronic diastolic heart failure. Chest x-ray without edema and BNP within the limits of normal. Await echo results   Acute kidney failure: Chart review indicates may be some component of chronic kidney disease as well. Decrease IV rate. Will recheck in the morning. Urine output adequate  Chronic diastolic heart failure: See above. Evaluated by cardiology in the emergency department. Cardiology note indicates most recent LVEF evaluation by stress echo in 2013 with a baseline LVEF reported at 60%. Weight 86.3kg from 87.9kg on admission.    CAD, NATIVE VESSEL - PCI + DES to left circumflex 05/14/13; see #1. Aspirin and Plavix for dual antiplatelet therapy for at least one year per cardiology note dated May 6.  According to notes intolerance to statins. See #1.  HYPERLIPIDEMIA-MIXED: Unable to tolerate statins. Plan for control is through  diet. LDL 103 otherwise lipid panel unremarkable.  GASTROESOPHAGEAL REFLUX DISEASE, CHRONIC: remains stable at baseline. Will continue her PPI   HYPERTENSION, HX OF: Controlled on admission. Chart review indicates amlodipine and Lasix both increased in may of this year per cardiology.   Abnormal chest xray: No infiltrate or edema noted. However report includes "Cannot exclude subtle right upper lobe nodule. Consider further evaluation with PA and lateral radiographs". May be followed up on an outpatient basis  Anxiety: appears stable at baseline     Code Status: full Family Communication: none present Disposition Plan: home hopefully tomorrow   Consultants:  Dr Harl Bowie cardiology  Procedures:  Stress test  Antibiotics:  none  HPI/Subjective: Awake alert. Complains generalized weakness and continued intermittent chest pain  Objective: Filed Vitals:   11/25/13 0504  BP: 126/49  Pulse: 63  Temp: 98.2 F (36.8 C)  Resp: 20    Intake/Output Summary (Last 24 hours) at 11/25/13 1405 Last data filed at 11/25/13 0800  Gross per 24 hour  Intake 1172.5 ml  Output   1050 ml  Net  122.5 ml   Filed Weights   11/24/13 1525 11/24/13 1537 11/25/13 0504  Weight: 87.998 kg (194 lb) 87.8 kg (193 lb 9 oz) 86.365 kg (190 lb 6.4 oz)    Exam:   General:  Well nourished appears comfortable  Cardiovascular: RRR No MGR No LE edema  Respiratory: normal effort BS clear bilaterally no wheeze no rhonchi  Abdomen: soft non-distended +BS non-tender to palpation  Musculoskeletal: no clubbing or cyanosis   Data Reviewed: Basic Metabolic Panel:  Recent Labs Lab 11/24/13 1110 11/25/13 0545  NA 138 143  K 3.6* 3.4*  CL 100 105  CO2 25 25  GLUCOSE  139* 115*  BUN 17 18  CREATININE 1.13* 1.15*  CALCIUM 9.2 8.9  MG 1.9  --    Liver Function Tests: No results found for this basename: AST, ALT, ALKPHOS, BILITOT, PROT, ALBUMIN,  in the last 168 hours No results found for this  basename: LIPASE, AMYLASE,  in the last 168 hours No results found for this basename: AMMONIA,  in the last 168 hours CBC:  Recent Labs Lab 11/24/13 1110  WBC 5.2  NEUTROABS 3.2  HGB 12.3  HCT 34.9*  MCV 89.7  PLT 257   Cardiac Enzymes:  Recent Labs Lab 11/24/13 1739 11/24/13 2325  TROPONINI <0.30 <0.30   BNP (last 3 results)  Recent Labs  11/24/13 1110  PROBNP 292.3   CBG: No results found for this basename: GLUCAP,  in the last 168 hours  No results found for this or any previous visit (from the past 240 hour(s)).   Studies: Nm Myocar Multi W/spect W/wall Motion / Ef  11/25/2013   CLINICAL DATA:  80 year old female with a known history of coronary artery disease referred for chest pain.  EXAM: MYOCARDIAL IMAGING WITH SPECT (REST AND PHARMACOLOGIC-STRESS)  GATED LEFT VENTRICULAR WALL MOTION STUDY  LEFT VENTRICULAR EJECTION FRACTION  TECHNIQUE: Standard myocardial SPECT imaging was performed after resting intravenous injection of 10 mCi Tc-73m sestamibi. Subsequently, intravenous infusion of Lexiscan was performed under the supervision of the Cardiology staff. At peak effect of the drug, 30 mCi Tc-37m sestamibi was injected intravenously and standard myocardial SPECT imaging was performed. Quantitative gated imaging was also performed to evaluate left ventricular wall motion, and estimate left ventricular ejection fraction.  COMPARISON:  None.  FINDINGS: Pharmacological stress  Baseline EKG shows sinus bradycardia. After injection heart rate increased from 55 beats per min up to 75 beats per min and blood pressure increased from 138/67 up to 143/63. The test was stopped after injection was complete, the patient did not experience any chest pain.  Post-injection EKG showed no specific ischemic changes and no significant arrhythmias.  Myocardial perfusion imaging  Raw images show significant gut radiotracer uptake. There is a small mild apical defect seen in both the resting and  post-injection images. The apex has normal wall motion, overall finding is most consistent with apical thinning. There are no other myocardial perfusion defects.  Gated imaging shows end-diastolic volume XX123456 mL, end systolic volume 74 mL, left ventricular ejection fraction 31%. TID 1.09. There was normal wall motion.  IMPRESSION: 1. Abnormal Lexiscan due to low ejection fraction. There is no evidence of ischemia.  2. Visually LVEF appears normal. By calculated volumes LVEF reported at 31%. Recommend echo to clarify LV function   Electronically Signed   By: Carlyle Dolly   On: 11/25/2013 11:47   Dg Chest Portable 1 View  11/24/2013   CLINICAL DATA:  Chest pain. Worsened last 3 weeks. Hypertension. COPD.  EXAM: PORTABLE CHEST - 1 VIEW  COMPARISON:  04/01/2013  FINDINGS: Midline trachea. Patient minimally rotated to the right. Borderline cardiomegaly. No pleural effusion or pneumothorax. Low lung volumes with resultant pulmonary interstitial prominence. Possible right upper lobe nodular density.  IMPRESSION: No acute cardiopulmonary disease.  Cannot exclude subtle right upper lobe nodule. Consider further evaluation with PA and lateral radiographs.   Electronically Signed   By: Abigail Miyamoto M.D.   On: 11/24/2013 11:21    Scheduled Meds: . amLODipine  10 mg Oral Daily  . aspirin EC  325 mg Oral Daily  . carvedilol  12.5 mg  Oral BID  . clopidogrel  75 mg Oral Daily  . DULoxetine  60 mg Oral QHS  . enoxaparin (LOVENOX) injection  40 mg Subcutaneous Q24H  . furosemide  20 mg Oral Daily  . pantoprazole  40 mg Oral q morning - 10a  . potassium chloride  40 mEq Oral Once  . ranolazine  500 mg Oral BID  . senna  1 tablet Oral BID  . sodium chloride  3 mL Intravenous Q12H  . sodium chloride  3 mL Intravenous Q12H   Continuous Infusions:   Principal Problem:   Chest pain Active Problems:   HYPERLIPIDEMIA-MIXED   CAD, NATIVE VESSEL - PCI + DES to left circumflex 05/14/13   Chronic diastolic heart  failure   PVD   GASTROESOPHAGEAL REFLUX DISEASE, CHRONIC   HYPERTENSION, HX OF   Lower extremity edema   Acute kidney failure   Hypokalemia    Time spent: 35 minutes    [facility] Pager [phone]. If 7PM-7AM, please contact night-coverage at [email or web address], password [facility] 11/25/2013, 2:05 PM  LOS: 1 day

## 2013-11-26 ENCOUNTER — Ambulatory Visit (HOSPITAL_COMMUNITY): Payer: Medicare Other

## 2013-11-26 ENCOUNTER — Encounter (HOSPITAL_COMMUNITY): Payer: Medicare Other

## 2013-11-26 DIAGNOSIS — I5032 Chronic diastolic (congestive) heart failure: Secondary | ICD-10-CM

## 2013-11-26 DIAGNOSIS — R0789 Other chest pain: Secondary | ICD-10-CM | POA: Diagnosis not present

## 2013-11-26 DIAGNOSIS — R079 Chest pain, unspecified: Secondary | ICD-10-CM | POA: Diagnosis not present

## 2013-11-26 LAB — CBC
HCT: 33.9 % — ABNORMAL LOW (ref 36.0–46.0)
HEMOGLOBIN: 11.7 g/dL — AB (ref 12.0–15.0)
MCH: 31.5 pg (ref 26.0–34.0)
MCHC: 34.5 g/dL (ref 30.0–36.0)
MCV: 91.1 fL (ref 78.0–100.0)
Platelets: 256 10*3/uL (ref 150–400)
RBC: 3.72 MIL/uL — ABNORMAL LOW (ref 3.87–5.11)
RDW: 12.9 % (ref 11.5–15.5)
WBC: 4.9 10*3/uL (ref 4.0–10.5)

## 2013-11-26 LAB — BASIC METABOLIC PANEL
ANION GAP: 12 (ref 5–15)
BUN: 11 mg/dL (ref 6–23)
CALCIUM: 8.9 mg/dL (ref 8.4–10.5)
CO2: 26 meq/L (ref 19–32)
CREATININE: 1.08 mg/dL (ref 0.50–1.10)
Chloride: 104 mEq/L (ref 96–112)
GFR calc Af Amer: 55 mL/min — ABNORMAL LOW (ref >90)
GFR calc non Af Amer: 48 mL/min — ABNORMAL LOW (ref >90)
GLUCOSE: 99 mg/dL (ref 70–99)
Potassium: 3.8 mEq/L (ref 3.7–5.3)
SODIUM: 142 meq/L (ref 137–147)

## 2013-11-26 LAB — TSH: TSH: 2.77 u[IU]/mL (ref 0.350–4.500)

## 2013-11-26 MED ORDER — BISACODYL 10 MG RE SUPP: 10.0000 mg | Freq: Every day | RECTAL | Status: DC | PRN

## 2013-11-26 MED ORDER — ALUM & MAG HYDROXIDE-SIMETH 200-200-20 MG/5ML PO SUSP: 30.0000 mL | Freq: Four times a day (QID) | ORAL | Status: DC | PRN

## 2013-11-26 NOTE — Evaluation (Signed)
Physical Therapy Evaluation Patient Details Name: [name] MRN: [id number] DOB: May 15, 1932 Today's Date: 11/26/2013   History of Present Illness  [name] is a 80 y.o. female with past medical history that includes CAD status post stent placement 8 months ago, hypertension, hyperlipidemia, chronic diastolic heart failure presents to the emergency department with a chief complaint of persistent intermittent chest pain and lower extremity edema. She was evaluated by Dr. Iran with cardiology who opined no initial evidence of ACS but recommended admission for observation and stress test tomorrow.  Patient reports left anterior chest pain radiating to the back intermittently for the previous 3 months. She describes the pain as sharp and states each episode only lasts "a few seconds". Associated symptoms include worsening shortness of breath with some palpitations and lower extremity edema. She reports a lower extremity edema has gradually worsened over the previous 3 weeks. She has been compliant with her Lasix. She reports the swelling is worse at the end of the day. She states she saw her cardiologist in May who at that time increased her amlodipine and her Lasix. This morning she awakened and the pain was a little worse and lasted longer. She also developed a headache. She called her PCP who recommended she come to the emergency department for evaluation.  Echo shows normal LVEF, 65-70%. Combined with her negative stress test, there is no clear cardiac cause for her symptoms. No further cardiac workup planned at this time. Reasonable discharge tonight with follow up with Dr Bronson Ing or NP Purcell Nails in 2-3 weeks. She is already on 3 antianginals, I do not see benefit at starting another as it does not appear her pain is ischemic.  Clinical Impression  Pt is a 80 year old female who presents to PT with dx of chest pain.  During evaluation, pt was (I) with bed mobility skills and mod (I)/supervision  with transfers and gait with RW, per pt request for amb with AD.  Pt reports she has a rollator at home, though does not use it.  Recommended to pt to use rollator as she has limited endurance/activity tolerance at this time.  Pt was receiving [facility] services secondary to continued back pain/weakness after back surgery in 12/2012 until ~4 weeks ago when health issues prevented pt from attending PT; recommend pt be referred back to [place] [facility] services to continue with lower body/trunk/core stabilization program to decrease LBP.  No DME recommendations at this time.  Pt to be discharged from acute PT services.     Follow Up Recommendations Outpatient PT    Equipment Recommendations  None recommended by PT       Precautions / Restrictions Precautions Precautions: Fall Restrictions Weight Bearing Restrictions: No      Mobility  Bed Mobility Overal bed mobility: Independent                Transfers Overall transfer level: Modified independent Equipment used: Rolling walker (2 wheeled)                Ambulation/Gait Ambulation/Gait assistance: Supervision;Modified independent (Device/Increase time) Ambulation Distance (Feet): 65 Feet Assistive device: Rolling walker (2 wheeled) Gait Pattern/deviations: Step-through pattern;Decreased step length - right;Decreased step length - left   Gait velocity interpretation: Below normal speed for age/gender General Gait Details: Gait distance limited secondary to fatigue.       Balance Overall balance assessment: Needs assistance Sitting-balance support: No upper extremity supported;Feet supported Sitting balance-Leahy Scale: Normal     Standing balance support: Bilateral  upper extremity supported;During functional activity (on RW) Standing balance-Leahy Scale: Fair                               Pertinent Vitals/Pain Pain Assessment: No/denies pain    Home Living Family/patient expects to be discharged to:: Private  residence Living Arrangements: Children Available Help at Discharge: Family;Available PRN/intermittently Type of Home: House Home Access: Ramped entrance;Stairs to enter Entrance Stairs-Rails: Left Entrance Stairs-Number of Steps: 3 Home Layout: One level Home Equipment: Cane - single point;Grab bars - tub/shower;Shower seat;Bedside commode Tour manager)      Prior Function Level of Independence: Independent                  Extremity/Trunk Assessment               Lower Extremity Assessment: Overall WFL for tasks assessed         Communication   Communication: No difficulties  Cognition Arousal/Alertness: Awake/alert Behavior During Therapy: WFL for tasks assessed/performed Overall Cognitive Status: Within Functional Limits for tasks assessed                               Assessment/Plan    PT Assessment All further PT needs can be met in the next venue of care  PT Diagnosis Difficulty walking   PT Problem List Decreased activity tolerance;Decreased mobility  PT Treatment Interventions     PT Goals (Current goals can be found in the Care Plan section) Acute Rehab PT Goals Patient Stated Goal: walk without limitation PT Goal Formulation: No goals set, d/c therapy     End of Session Equipment Utilized During Treatment: Gait belt Activity Tolerance: Patient limited by fatigue Patient left: in chair;with call bell/phone within reach;with chair alarm set      Functional Assessment Tool Used: Clinical Judgement Functional Limitation: Mobility: Walking and moving around Mobility: Walking and Moving Around Current Status ([id number]): At least 1 percent but less than 20 percent impaired, limited or restricted Mobility: Walking and Moving Around Goal Status [phone]): 0 percent impaired, limited or restricted    Time: 0815-0840 PT Time Calculation (min): 25 min   Charges:   PT Evaluation $Initial PT Evaluation Tier I: 1 Procedure     PT G Codes:    Functional Assessment Tool Used: Clinical Judgement Functional Limitation: Mobility: Walking and moving around    [facility] 11/26/2013, 8:54 AM

## 2013-11-26 NOTE — Discharge Summary (Signed)
Physician Discharge Summary  ANALLELY PRIVETT [id number] DOB: [birth year] DOA: 11/24/2013  PCP: [name]  Admit date: 11/24/2013 Discharge date: 11/26/2013  Time spent: 40 minutes  Recommendations for Outpatient Follow-up:  1. Follow up with Dr Jacinta Shoe as scheduled 2. Follow up with PCP 1-2 weeks for evaluation of symptoms. May consider PA and lateral chest xray to evaluate possible "subtle right upper lobe nodule" 3. OP physical therapy for strength and endurence  Discharge Diagnoses:  Principal Problem:   Chest pain Active Problems:   HYPERLIPIDEMIA-MIXED   CAD, NATIVE VESSEL - PCI + DES to left circumflex 05/14/13   Chronic diastolic heart failure   PVD   GASTROESOPHAGEAL REFLUX DISEASE, CHRONIC   HYPERTENSION, HX OF   Lower extremity edema   Acute kidney failure   Hypokalemia   Discharge Condition: stable  Diet recommendation: heart healthy  Filed Weights   11/24/13 1537 11/25/13 0504 11/26/13 0652  Weight: 87.8 kg (193 lb 9 oz) 86.365 kg (190 lb 6.4 oz) 87.272 kg (192 lb 6.4 oz)    History of present illness:  [name] is a 80 y.o. female with past medical history that includes CAD status post stent placement 8 months ago, hypertension, hyperlipidemia, chronic diastolic heart failure presented to the emergency department on 11/24/13 with a chief complaint of persistent intermittent chest pain and lower extremity edema. She was evaluated by Dr. Harl Bowie with cardiology who opined no initial evidence of ACS but recommended admission for observation and stress test.   Patient reported left anterior chest pain radiating to the back intermittently for the previous 3 months. She described the pain as sharp and stated each episode only lasted "a few seconds". Associated symptoms included worsening shortness of breath with some palpitations and lower extremity edema. She reported a lower extremity edema had gradually worsened over the previous 3 weeks. She had been  compliant with her Lasix. She reported the swelling  worse at the end of the day. She stated she saw her cardiologist in May who at that time increased her amlodipine and her Lasix.   on day of admission,  she awakened and the pain was a little worse and lasted longer. She also developed a headache. She called her PCP who recommended she come to the emergency department for evaluation.   Initial evaluation included basic metabolic panel significant for potassium of 3.6 creatinine of 1.13 serum glucose in the 139. Complete blood count  unremarkable. ProBNP 292 and initial troponin negative. Chest x-ray with no acute cardiopulmonary disease an EKG with sinus rhythm. Emergency department she ws given 1 inch nitro paste 325 mg of aspirin. At the time of admission she was pain-free. she was hemodynamically stable afebrile and not hypoxic.   Hospital Course:  Principal Problem:  Chest pain: Intermittent but recurring. Cardiac enzymes negative. EKGs with no acute changes. 2-D echo with 65% EF and indetermenent diastolic dysfunction. Lexiscan negative for ischemia. Evaluated by cardiology who opine low LVEF on nuclear study appeared to be processing issue as LV function by echo is normal with mild to moderate AI that can be followed over time. No evidence of volume overload per xray, BNP and normal IVC on echo. Recommended no further cardiac workup.  Will continue her aspirin at full dose and Plavix. Continue current antianginals. Follow up with Dr Jacinta Shoe as scheduled  Active Problems:  Lower extremity edema: No lower extremity edema on admission. Patient with history of chronic diastolic heart failure. Chest x-ray without edema and BNP within  the limits of normal. Per cardiology evidence of mild diastolic dysfunction. See #1.  Acute kidney failure: Chart review indicates may be some component of chronic kidney disease as well. Resolved at discharge.    Chronic diastolic heart failure: See above. Evaluated  by cardiology in the emergency department. Volume status -837cc. Weight 87.3kg at discharge from 87.9kg on admission.   CAD, NATIVE VESSEL - PCI + DES to left circumflex 05/14/13; see #1. Aspirin and Plavix for dual antiplatelet therapy for at least one year per cardiology note dated May 6. According to notes intolerance to statins. See #1.   HYPERLIPIDEMIA-MIXED: Unable to tolerate statins. Plan for control is through diet. LDL 103 otherwise lipid panel unremarkable.   GASTROESOPHAGEAL REFLUX DISEASE, CHRONIC: may be contributing to pain. Recommended small frequent meal, eating less, eating less acidic foods, sitting up 1 hour after eating. Continue her PPI. Follow up with PCP 1-2 weeks for evaluation.  HYPERTENSION, HX OF: Controlled.   Abnormal chest xray: No infiltrate or edema noted. However report includes "Cannot exclude subtle right upper lobe nodule. Consider further evaluation with PA and lateral radiographs". May be followed up on an outpatient basis   Anxiety: appears stable at baseline    Procedures:  Lexiscan 11/25/13  Consultations:  cardiology  Discharge Exam: Filed Vitals:   11/26/13 0652  BP: 121/42  Pulse: 64  Temp: 98 F (36.7 C)  Resp:     General: well nourished appears comfortable sitting in chair eating Cardiovascular: RRR +murmur no gallup or rub no LE edema Respiratory: normal effort BS slightly diminished but clear no crackles  Discharge Instructions You were cared for by a hospitalist during your hospital stay. If you have any questions about your discharge medications or the care you received while you were in the hospital after you are discharged, you can call the unit and asked to speak with the hospitalist on call if the hospitalist that took care of you is not available. Once you are discharged, your primary care physician will handle any further medical issues. Please note that NO REFILLS for any discharge medications will be authorized once you  are discharged, as it is imperative that you return to your primary care physician (or establish a relationship with a primary care physician if you do not have one) for your aftercare needs so that they can reassess your need for medications and monitor your lab values.  Discharge Instructions   Diet - low sodium heart healthy    Complete by:  As directed      Discharge instructions    Complete by:  As directed   Take medications as directed Follow up with Dr Jacinta Shoe as scheduled Call PCP for appointment 1-2 weeks for evaluation of symptoms     Increase activity slowly    Complete by:  As directed             Medication List         acetaminophen 500 MG tablet  Commonly known as:  TYLENOL  Take 500 mg by mouth every 8 (eight) hours as needed for moderate pain.     ALPRAZolam 0.25 MG tablet  Commonly known as:  XANAX  Take 0.25 mg by mouth daily as needed for anxiety.     alum & mag hydroxide-simeth 200-200-20 MG/5ML suspension  Commonly known as:  MAALOX/MYLANTA  Take 30 mLs by mouth every 6 (six) hours as needed for indigestion or heartburn (dyspepsia).     amLODipine 10 MG tablet  Commonly known as:  NORVASC  Take 1 tablet (10 mg total) by mouth daily.     aspirin 81 MG EC tablet  Take 81 mg by mouth every morning.     bisacodyl 10 MG suppository  Commonly known as:  DULCOLAX  Place 1 suppository (10 mg total) rectally daily as needed for moderate constipation.     carvedilol 12.5 MG tablet  Commonly known as:  COREG  Take 1 tablet (12.5 mg total) by mouth 2 (two) times daily.     clopidogrel 75 MG tablet  Commonly known as:  PLAVIX  TAKE ONE TABLET BY MOUTH DAILY WITH BREAKFAST     CRESTOR 5 MG tablet  Generic drug:  rosuvastatin  Take 5 mg by mouth daily.     DULoxetine 60 MG capsule  Commonly known as:  CYMBALTA  Take 60 mg by mouth at bedtime.     furosemide 20 MG tablet  Commonly known as:  LASIX  Take 1 tablet (20 mg total) by mouth daily.      nitroGLYCERIN 0.4 MG SL tablet  Commonly known as:  NITROSTAT  Place 1 tablet (0.4 mg total) under the tongue every 5 (five) minutes as needed. For chest pain     pantoprazole 40 MG tablet  Commonly known as:  PROTONIX  Take 40 mg by mouth every morning.     polyethylene glycol packet  Commonly known as:  MIRALAX / GLYCOLAX  Take 17 g by mouth daily as needed. For constipation     POTASSIUM GLUCONATE PO  Take 99 mg by mouth every morning.     RANEXA 500 MG 12 hr tablet  Generic drug:  ranolazine  TAKE ONE TABLET BY MOUTH TWICE DAILY       Allergies  Allergen Reactions  . Phenothiazines Anaphylaxis and Hives  . Polysorbate Anaphylaxis  . Prednisone Shortness Of Breath  . Codeine Itching  . Statins Nausea And Vomiting       . Prochlorperazine Other (See Comments)    unknown  . Atorvastatin Hives  . Ofloxacin Rash  . Other Itching and Rash    "WOOL"= make skin look like it has been burned  . Penicillins Other (See Comments)    Causes redness all over.   . Pimozide Hives and Itching       Follow-up Information   Follow up with [facility], PA-C. Schedule an appointment as soon as possible for a visit in 1 week. (for evaluation of chest/abdominal pain. )    Specialty:  Physician Assistant   Contact information:   [place] [address]       Follow up with [facility] On 12/02/2013. (5pm )    Specialties:  Physical Therapy, Occupational Therapy   Contact information:   [place] [address] [phone]        The results of significant diagnostics from this hospitalization (including imaging, microbiology, ancillary and laboratory) are listed below for reference.    Significant Diagnostic Studies: Nm Myocar Multi W/spect W/wall Motion / Ef  11/25/2013   CLINICAL DATA:  80 year old female with a known history of coronary artery disease referred for chest pain.  EXAM: MYOCARDIAL IMAGING WITH SPECT (REST AND  PHARMACOLOGIC-STRESS)  GATED LEFT VENTRICULAR WALL MOTION STUDY  LEFT VENTRICULAR EJECTION FRACTION  TECHNIQUE: Standard myocardial SPECT imaging was performed after resting intravenous injection of 10 mCi Tc-5m sestamibi. Subsequently, intravenous infusion of Lexiscan was performed under the supervision of  the Cardiology staff. At peak effect of the drug, 30 mCi Tc-62m sestamibi was injected intravenously and standard myocardial SPECT imaging was performed. Quantitative gated imaging was also performed to evaluate left ventricular wall motion, and estimate left ventricular ejection fraction.  COMPARISON:  None.  FINDINGS: Pharmacological stress  Baseline EKG shows sinus bradycardia. After injection heart rate increased from 55 beats per min up to 75 beats per min and blood pressure increased from 138/67 up to 143/63. The test was stopped after injection was complete, the patient did not experience any chest pain.  Post-injection EKG showed no specific ischemic changes and no significant arrhythmias.  Myocardial perfusion imaging  Raw images show significant gut radiotracer uptake. There is a small mild apical defect seen in both the resting and post-injection images. The apex has normal wall motion, overall finding is most consistent with apical thinning. There are no other myocardial perfusion defects.  Gated imaging shows end-diastolic volume XX123456 mL, end systolic volume 74 mL, left ventricular ejection fraction 31%. TID 1.09. There was normal wall motion.  IMPRESSION: 1. Abnormal Lexiscan due to low ejection fraction. There is no evidence of ischemia.  2. Visually LVEF appears normal. By calculated volumes LVEF reported at 31%. Recommend echo to clarify LV function   Electronically Signed   By: Carlyle Dolly   On: 11/25/2013 11:47   Dg Chest Portable 1 View  11/24/2013   CLINICAL DATA:  Chest pain. Worsened last 3 weeks. Hypertension. COPD.  EXAM: PORTABLE CHEST - 1 VIEW  COMPARISON:  04/01/2013  FINDINGS:  Midline trachea. Patient minimally rotated to the right. Borderline cardiomegaly. No pleural effusion or pneumothorax. Low lung volumes with resultant pulmonary interstitial prominence. Possible right upper lobe nodular density.  IMPRESSION: No acute cardiopulmonary disease.  Cannot exclude subtle right upper lobe nodule. Consider further evaluation with PA and lateral radiographs.   Electronically Signed   By: Abigail Miyamoto M.D.   On: 11/24/2013 11:21    Microbiology: No results found for this or any previous visit (from the past 240 hour(s)).   Labs: Basic Metabolic Panel:  Recent Labs Lab 11/24/13 1110 11/25/13 0545 11/26/13 0545  NA 138 143 142  K 3.6* 3.4* 3.8  CL 100 105 104  CO2 25 25 26   GLUCOSE 139* 115* 99  BUN 17 18 11   CREATININE 1.13* 1.15* 1.08  CALCIUM 9.2 8.9 8.9  MG 1.9  --   --    Liver Function Tests: No results found for this basename: AST, ALT, ALKPHOS, BILITOT, PROT, ALBUMIN,  in the last 168 hours No results found for this basename: LIPASE, AMYLASE,  in the last 168 hours No results found for this basename: AMMONIA,  in the last 168 hours CBC:  Recent Labs Lab 11/24/13 1110 11/26/13 0545  WBC 5.2 4.9  NEUTROABS 3.2  --   HGB 12.3 11.7*  HCT 34.9* 33.9*  MCV 89.7 91.1  PLT 257 256   Cardiac Enzymes:  Recent Labs Lab 11/24/13 1739 11/24/13 2325  TROPONINI <0.30 <0.30   BNP: BNP (last 3 results)  Recent Labs  11/24/13 1110  PROBNP 292.3   CBG: No results found for this basename: GLUCAP,  in the last 168 hours     Signed:  Radene Gunning  [facility] 11/26/2013, 11:00 AM  Addendum I personally saw and examined patient and agree with the above findings. Stress test negative, will follow up with PCP in 1-2 weeks

## 2013-11-26 NOTE — Progress Notes (Addendum)
Subjective:  Complains of dyspnea on exertion.  Objective:  Vital Signs in the last 24 hours: Temp:  [97.6 F (36.4 C)-98 F (36.7 C)] 98 F (36.7 C) (08/12 0652) Pulse Rate:  [61-64] 64 (08/12 0652) Resp:  [20] 20 (08/11 2024) BP: (121-143)/(42-49) 121/42 mmHg (08/12 0652) SpO2:  [92 %-96 %] 92 % (08/12 0652) Weight:  [192 lb 6.4 oz (87.272 kg)] 192 lb 6.4 oz (87.272 kg) (08/12 0652)  Intake/Output from previous day: 08/11 0701 - 08/12 0700 In: 240 [P.O.:240] Out: 1200 [Urine:1200] Intake/Output from this shift:    Physical Exam: NECK: Without JVD, HJR, or bruit LUNGS: Decreased BS but Clear anterior, posterior, lateral HEART: Regular rate and [id number] systolic murmur LSB/apex, gallop, rub, bruit, thrill, or heave EXTREMITIES: Without cyanosis, clubbing, or edema   Lab Results:  Recent Labs  11/24/13 1110 11/26/13 0545  WBC 5.2 4.9  HGB 12.3 11.7*  PLT 257 256    Recent Labs  11/25/13 0545 11/26/13 0545  NA 143 142  K 3.4* 3.8  CL 105 104  CO2 25 26  GLUCOSE 115* 99  BUN 18 11  CREATININE 1.15* 1.08    Recent Labs  11/24/13 1739 11/24/13 2325  TROPONINI <0.30 <0.30   Hepatic Function Panel No results found for this basename: PROT, ALBUMIN, AST, ALT, ALKPHOS, BILITOT, BILIDIR, IBILI,  in the last 72 hours  Recent Labs  11/24/13 1110  CHOL 181   No results found for this basename: PROTIME,  in the last 72 hours  Imaging:   Cardiac Studies: Study Conclusions  - Left ventricle: The cavity size was normal. Wall thickness was   normal. Systolic function was vigorous. The estimated ejection   fraction was in the range of 65% to 70%. Indetermiant diastolic   function. There is evidence of mildly elevated LA pressure (E/e&'   10) - Aortic valve: There was mild to moderate regurgitation.   Regurgitation pressure half-time: 433 ms. - Mitral valve: Mildly calcified annulus. Mildly thickened leaflets   . - Left atrium: The atrium was mildly  dilated. - Atrial septum: No defect or patent foramen ovale was identified. - Technically difficult study.   Assessment/Plan:    LOS: 2 days  1. CAD/chest pain - hx of DES to LCX Jan 2015   - reports increasing chest pain, fairly mixed characteristics for cardiac cause.   - no evidence of ACS at this time by EKGs or enzymes - Lexiscan negative for ischemia, 2Decho: normal LV function, mild-mod MR. - intolerant to statins per notes       2. Chronic diastolic heart failure   - repeat echo Normal LV function.   - reports worsening DOE, LE edema. Does not appear hypervolemic by exam, CXR, or BNP   -f/u Dr. Bronson Ing in [place] in 2 weeks.    [name] 11/26/2013, 8:57 AM  Attending Note Patient seen and discussed with PA Lenze. Nuclear stress test with no ischemia. The low LVEF from the nuclear study appears to have been a processing issue, as her LV function by echo is normal. She has mild to moderate AI by echo that can be followed over time, but is not to the degree to explain her SOB. Evidence of mild diastolic dysfunction by echo with elevated LA pressures. Her exam, CXR, BNP, and normal IVC on echo have not supported volume overload. No further cardiac testing planned at this time, she may follow up with Dr Bronson Ing in 2 weeks. She is already on 3 antianginals,  I do not see benefit at starting another as it does not appear her pain is ischemic.    Zandra Abts MD

## 2013-11-26 NOTE — Progress Notes (Signed)
UR Completed Evia Goldsmith Graves-Bigelow, RN,BSN [phone]  

## 2013-12-04 DIAGNOSIS — M545 Low back pain, unspecified: Secondary | ICD-10-CM | POA: Diagnosis not present

## 2013-12-04 DIAGNOSIS — M2550 Pain in unspecified joint: Secondary | ICD-10-CM | POA: Diagnosis not present

## 2013-12-04 DIAGNOSIS — R293 Abnormal posture: Secondary | ICD-10-CM | POA: Diagnosis not present

## 2013-12-04 DIAGNOSIS — M2560 Stiffness of unspecified joint, not elsewhere classified: Secondary | ICD-10-CM | POA: Diagnosis not present

## 2013-12-08 ENCOUNTER — Other Ambulatory Visit: Payer: Self-pay | Admitting: Cardiovascular Disease

## 2013-12-08 DIAGNOSIS — J449 Chronic obstructive pulmonary disease, unspecified: Secondary | ICD-10-CM | POA: Diagnosis not present

## 2013-12-09 DIAGNOSIS — M545 Low back pain, unspecified: Secondary | ICD-10-CM | POA: Diagnosis not present

## 2013-12-09 DIAGNOSIS — R293 Abnormal posture: Secondary | ICD-10-CM | POA: Diagnosis not present

## 2013-12-09 DIAGNOSIS — M2550 Pain in unspecified joint: Secondary | ICD-10-CM | POA: Diagnosis not present

## 2013-12-09 DIAGNOSIS — M2560 Stiffness of unspecified joint, not elsewhere classified: Secondary | ICD-10-CM | POA: Diagnosis not present

## 2013-12-12 DIAGNOSIS — I7090 Unspecified atherosclerosis: Secondary | ICD-10-CM | POA: Diagnosis not present

## 2013-12-12 DIAGNOSIS — R911 Solitary pulmonary nodule: Secondary | ICD-10-CM | POA: Diagnosis not present

## 2013-12-12 DIAGNOSIS — J438 Other emphysema: Secondary | ICD-10-CM | POA: Diagnosis not present

## 2013-12-16 DIAGNOSIS — M545 Low back pain, unspecified: Secondary | ICD-10-CM | POA: Diagnosis not present

## 2013-12-16 DIAGNOSIS — R293 Abnormal posture: Secondary | ICD-10-CM | POA: Diagnosis not present

## 2013-12-16 DIAGNOSIS — M2550 Pain in unspecified joint: Secondary | ICD-10-CM | POA: Diagnosis not present

## 2013-12-16 DIAGNOSIS — M2560 Stiffness of unspecified joint, not elsewhere classified: Secondary | ICD-10-CM | POA: Diagnosis not present

## 2013-12-18 DIAGNOSIS — M545 Low back pain, unspecified: Secondary | ICD-10-CM | POA: Diagnosis not present

## 2013-12-18 DIAGNOSIS — M2550 Pain in unspecified joint: Secondary | ICD-10-CM | POA: Diagnosis not present

## 2013-12-18 DIAGNOSIS — M2560 Stiffness of unspecified joint, not elsewhere classified: Secondary | ICD-10-CM | POA: Diagnosis not present

## 2013-12-18 DIAGNOSIS — R293 Abnormal posture: Secondary | ICD-10-CM | POA: Diagnosis not present

## 2013-12-23 ENCOUNTER — Ambulatory Visit (INDEPENDENT_AMBULATORY_CARE_PROVIDER_SITE_OTHER): Payer: Medicare Other | Admitting: Cardiovascular Disease

## 2013-12-23 ENCOUNTER — Encounter: Payer: Self-pay | Admitting: Cardiovascular Disease

## 2013-12-23 VITALS — BP 151/79 | HR 59 | Ht 61.0 in | Wt 184.0 lb

## 2013-12-23 DIAGNOSIS — M545 Low back pain, unspecified: Secondary | ICD-10-CM | POA: Diagnosis not present

## 2013-12-23 DIAGNOSIS — I209 Angina pectoris, unspecified: Secondary | ICD-10-CM

## 2013-12-23 DIAGNOSIS — R293 Abnormal posture: Secondary | ICD-10-CM | POA: Diagnosis not present

## 2013-12-23 DIAGNOSIS — I351 Nonrheumatic aortic (valve) insufficiency: Secondary | ICD-10-CM

## 2013-12-23 DIAGNOSIS — I10 Essential (primary) hypertension: Secondary | ICD-10-CM | POA: Diagnosis not present

## 2013-12-23 DIAGNOSIS — I2510 Atherosclerotic heart disease of native coronary artery without angina pectoris: Secondary | ICD-10-CM

## 2013-12-23 DIAGNOSIS — J3489 Other specified disorders of nose and nasal sinuses: Secondary | ICD-10-CM

## 2013-12-23 DIAGNOSIS — I5032 Chronic diastolic (congestive) heart failure: Secondary | ICD-10-CM | POA: Diagnosis not present

## 2013-12-23 DIAGNOSIS — I359 Nonrheumatic aortic valve disorder, unspecified: Secondary | ICD-10-CM

## 2013-12-23 DIAGNOSIS — E785 Hyperlipidemia, unspecified: Secondary | ICD-10-CM

## 2013-12-23 DIAGNOSIS — R0981 Nasal congestion: Secondary | ICD-10-CM

## 2013-12-23 DIAGNOSIS — Z87898 Personal history of other specified conditions: Secondary | ICD-10-CM

## 2013-12-23 DIAGNOSIS — Z9289 Personal history of other medical treatment: Secondary | ICD-10-CM

## 2013-12-23 DIAGNOSIS — M2560 Stiffness of unspecified joint, not elsewhere classified: Secondary | ICD-10-CM | POA: Diagnosis not present

## 2013-12-23 DIAGNOSIS — M2550 Pain in unspecified joint: Secondary | ICD-10-CM | POA: Diagnosis not present

## 2013-12-23 DIAGNOSIS — R1310 Dysphagia, unspecified: Secondary | ICD-10-CM

## 2013-12-23 NOTE — Patient Instructions (Signed)
Continue all current medications. Your physician wants you to follow up in: 6 months.  You will receive a reminder letter in the mail one-two months in advance.  If you don't receive a letter, please call our office to schedule the follow up appointment   

## 2013-12-23 NOTE — Progress Notes (Signed)
Patient ID: [id number] QUIRINDONGO, female   DOB: 1932-12-02, 80 y.o.   MRN: [id number]      SUBJECTIVE: Mrs. Miesse underwent percutaneous coronary intervention with drug-eluting stent to the left circumflex coronary artery in January 2015. Her proximal LAD showed mild nonobstructive disease and she had moderately severe disease in a small, nondominant RCA. She also has a history of hypertension, hyperlipidemia, chronic diastolic heart failure, depression and severe panic attacks. She has a h/o dyspnea which is multifactorial in etiology, but includes COPD due to a h/o tobacco abuse. She was hospitalized for chest pain and shortness of breath which was not deemed to be cardiac in etiology in August. There was no ischemia seen on nuclear stress testing on 11/25/2013. Echocardiogram in August demonstrated vigorous left ventricular systolic function, EF [id number], with mild to moderate aortic regurgitation.  She now describes symptoms of her "throat closing up". She also says she gets short of breath when she lies down. She has dysphagia for solids and liquids and says that she has had esophageal dilatations in the past. She also used to see Dr. Redmond Pulling for allergy problems. She describes sinus congestion as well.   Review of Systems: As per "subjective", otherwise negative.  Allergies  Allergen Reactions  . Phenothiazines Anaphylaxis and Hives  . Polysorbate Anaphylaxis  . Prednisone Shortness Of Breath  . Codeine Itching  . Prochlorperazine Other (See Comments)    unknown  . Atorvastatin Hives    Cramping; tolerates Crestor ok  . Ofloxacin Rash  . Other Itching and Rash    "WOOL"= make skin look like it has been burned  . Penicillins Other (See Comments)    Causes redness all over.   . Pimozide Hives and Itching    Current Outpatient Prescriptions  Medication Sig Dispense Refill  . acetaminophen (TYLENOL) 500 MG tablet Take 500 mg by mouth every 8 (eight) hours as needed for moderate pain.        [name] Kitchen ALPRAZolam (XANAX) 0.25 MG tablet Take 0.25 mg by mouth daily as needed for anxiety.       [name] Kitchen alum & mag hydroxide-simeth (MAALOX/MYLANTA) 200-200-20 MG/5ML suspension Take 30 mLs by mouth every 6 (six) hours as needed for indigestion or heartburn (dyspepsia).  355 mL  0  . amLODipine (NORVASC) 10 MG tablet Take 1 tablet (10 mg total) by mouth daily.  30 tablet  6  . aspirin 81 MG EC tablet Take 81 mg by mouth every morning.       . bisacodyl (DULCOLAX) 10 MG suppository Place 1 suppository (10 mg total) rectally daily as needed for moderate constipation.  12 suppository  0  . carvedilol (COREG) 12.5 MG tablet TAKE ONE TABLET BY MOUTH TWICE DAILY  60 tablet  3  . clopidogrel (PLAVIX) 75 MG tablet TAKE ONE TABLET BY MOUTH DAILY WITH BREAKFAST  30 tablet  6  . CRESTOR 5 MG tablet Take 5 mg by mouth daily.      . DULoxetine (CYMBALTA) 60 MG capsule Take 60 mg by mouth at bedtime.       . furosemide (LASIX) 20 MG tablet Take 1 tablet (20 mg total) by mouth daily.  30 tablet  6  . nitroGLYCERIN (NITROSTAT) 0.4 MG SL tablet Place 1 tablet (0.4 mg total) under the tongue every 5 (five) minutes as needed. For chest pain  25 tablet  4  . pantoprazole (PROTONIX) 40 MG tablet Take 40 mg by mouth every morning.       [name] Kitchen  polyethylene glycol (MIRALAX / GLYCOLAX) packet Take 17 g by mouth daily as needed. For constipation      . POTASSIUM GLUCONATE PO Take 99 mg by mouth every morning.       [name] Kitchen RANEXA 500 MG 12 hr tablet TAKE ONE TABLET BY MOUTH TWICE DAILY  60 tablet  6   No current facility-administered medications for this visit.    Past Medical History  Diagnosis Date  . PVD (peripheral vascular disease)   . Other and unspecified hyperlipidemia   . Coronary atherosclerosis of native coronary artery     nonobstructive CAD by multiple catheterizations  . Panic disorder without agoraphobia   . HTN (hypertension)     Hx of it  . MI (myocardial infarction) 2006  . Internal hemorrhoids without mention  of complication   . Dysphagia, unspecified(787.20)   . Microscopic colitis 2003  . GERD (gastroesophageal reflux disease)     Hx Schatzki's ring, multiple EGD/ED last 01/06/2004  . Hyperlipemia   . Thyroid disease     recent abnl TSH per pt  . S/P colonoscopy 09/27/2001    internal hemorrhoids, desc colon inflam polyp, SB BX-chronic duodenitis, colitis  . Arthritis   . Shortness of breath   . Heart disease   . Bursitis     left shoulder  . Hyperlipidemia   . Sleep disorder     obstructive  . COPD (chronic obstructive pulmonary disease)   . Complication of anesthesia   . PONV (postoperative nausea and vomiting)     'a little nausea"  . Anxiety   . Depression   . Pneumonia 12/2011  . Heart murmur     'a littel'  . Cataract     Past Surgical History  Procedure Laterality Date  . Tonsillectomy    . Unspecified area, hysterectomy  1972    partial  . Cholecystectomy  1998  . Right knee replacement  2007  . Right leg benign tumor    . Breast lumpectomy  1998    left, benign  . Left hand surgery    . Left rotator cuff surgery    . Nasal sinus surgery    . Bladder tack  06/2010  . Cardiac catheterization    . Maloney dilation  03/16/2011    Gastritis. No H.pylori on bx. 23F maloney dilation with disruption of  occult cevical esophageal web  . Colonoscopy  03/16/2011    multiple hyperplastic colon polyps, sigmoid diverticulosis, melanosis coli  . Bladder suspension  11/09/2011    Procedure: TRANSVAGINAL TAPE (TVT) PROCEDURE;  Surgeon: Marissa Nestle, MD;  Location: [facility];  Service: Urology;  Laterality: N/A;  . Abdominal hysterectomy    . Appendectomy    . Carpal tunnel release  1989    left  . Anterior and posterior repair      with resection of vagina  . Cardiac catheterization    . Shoulder surgery Left   . Joint replacement Right 2007  . Lumbar laminectomy/decompression microdiscectomy N/A 10/11/2012    Procedure: LUMBAR LAMINECTOMY/DECOMPRESSION MICRODISCECTOMY 2  LEVELS;  Surgeon: Floyce Stakes, MD;  Location: [facility];  Service: Neurosurgery;  Laterality: N/A;  L3-4 L4-5 Laminectomy    History   Social History  . Marital Status: Divorced    Spouse Name: N/A    Number of Children: 23  . Years of Education: N/A   Occupational History  . retired    Social History Main Topics  . Smoking status: Former Smoker --  1.00 packs/day for 64 years    Types: Cigarettes    Quit date: 11/17/2001  . Smokeless tobacco: Never Used     Comment: no tobacco  . Alcohol Use: No  . Drug Use: No  . Sexual Activity: No   Other Topics Concern  . Not on file   Social History Narrative   Divorced.   Sister had colon perforation & died from complications in [place], [place]    BP 151/79  Pulse 59  Wt 184 lbs.  PHYSICAL EXAM General: NAD HEENT: Normal. Neck: No JVD, no thyromegaly. Lungs: Clear to auscultation bilaterally with normal respiratory effort. CV: Nondisplaced PMI.  Regular rate and rhythm, normal S1/S2, no S3/S4, no murmur. No pretibial or periankle edema.  No carotid bruit.  Normal pedal pulses.  Abdomen: Soft, nontender, no hepatosplenomegaly, no distention.  Neurologic: Alert and oriented x 3.  Psych: Normal affect. Skin: Normal. Musculoskeletal: Normal range of motion, no gross deformities. Extremities: No clubbing or cyanosis.   ECG: Most recent ECG reviewed.      ASSESSMENT AND PLAN: 1. CAD with DES to LCx: Current symptoms are noncardiac in etiology. Given the fact that she had a drug-eluting stent in January 2015, she will need to continue dual antiplatelet therapy with aspirin and Plavix for a minimum of 1 year. She is intolerant of statins, but is now on Crestor. Recent normal nuclear MPI study. 2. Essential HTN: This remains elevated today. If it is persistently elevated, I would consider the addition of hydralazine. Coreg cannot be increased as HR 59 bpm, and amlodipine already 10 mg daily. 3. Chronic diastolic heart failure:  Optimal BP control is of prime importance. Continue amlodipine 10 mg daily. I am continuing Lasix 20 mg daily. Consider addition of hydralazine if BP persistently elevated. 4. Hyperlipidemia: Reportedly intolerant to statin therapy, but now on low-dose Crestor. LDL 103 on 11/24/13. Will need repeating in 3 months with LFT's. 5. Aortic regurgitation: Will monitor clinically and with surveillance echocardiograms. 6. Dysphagia for solids and liquids: Recommend follow up with GI as she may need EGD with esophageal dilatation 7. Sinus congestion with throat symptoms: Recommend f/u with ENT (Dr. Redmond Pulling, whom she has seen previously).  Dispo: f/u 6 months.   Kate Sable, M.D., F.A.C.C.

## 2013-12-25 DIAGNOSIS — R293 Abnormal posture: Secondary | ICD-10-CM | POA: Diagnosis not present

## 2013-12-25 DIAGNOSIS — K219 Gastro-esophageal reflux disease without esophagitis: Secondary | ICD-10-CM | POA: Diagnosis not present

## 2013-12-25 DIAGNOSIS — G4730 Sleep apnea, unspecified: Secondary | ICD-10-CM | POA: Diagnosis not present

## 2013-12-25 DIAGNOSIS — M2560 Stiffness of unspecified joint, not elsewhere classified: Secondary | ICD-10-CM | POA: Diagnosis not present

## 2013-12-25 DIAGNOSIS — M545 Low back pain, unspecified: Secondary | ICD-10-CM | POA: Diagnosis not present

## 2013-12-25 DIAGNOSIS — I10 Essential (primary) hypertension: Secondary | ICD-10-CM | POA: Diagnosis not present

## 2013-12-25 DIAGNOSIS — K589 Irritable bowel syndrome without diarrhea: Secondary | ICD-10-CM | POA: Diagnosis not present

## 2013-12-25 DIAGNOSIS — J449 Chronic obstructive pulmonary disease, unspecified: Secondary | ICD-10-CM | POA: Diagnosis not present

## 2013-12-25 DIAGNOSIS — M2550 Pain in unspecified joint: Secondary | ICD-10-CM | POA: Diagnosis not present

## 2013-12-25 DIAGNOSIS — E782 Mixed hyperlipidemia: Secondary | ICD-10-CM | POA: Diagnosis not present

## 2013-12-25 DIAGNOSIS — K649 Unspecified hemorrhoids: Secondary | ICD-10-CM | POA: Diagnosis not present

## 2013-12-26 DIAGNOSIS — R079 Chest pain, unspecified: Secondary | ICD-10-CM | POA: Diagnosis not present

## 2013-12-26 DIAGNOSIS — I2510 Atherosclerotic heart disease of native coronary artery without angina pectoris: Secondary | ICD-10-CM | POA: Diagnosis not present

## 2013-12-26 DIAGNOSIS — K219 Gastro-esophageal reflux disease without esophagitis: Secondary | ICD-10-CM | POA: Diagnosis not present

## 2013-12-26 DIAGNOSIS — J449 Chronic obstructive pulmonary disease, unspecified: Secondary | ICD-10-CM | POA: Diagnosis not present

## 2013-12-30 DIAGNOSIS — R293 Abnormal posture: Secondary | ICD-10-CM | POA: Diagnosis not present

## 2013-12-30 DIAGNOSIS — M2560 Stiffness of unspecified joint, not elsewhere classified: Secondary | ICD-10-CM | POA: Diagnosis not present

## 2013-12-30 DIAGNOSIS — M545 Low back pain, unspecified: Secondary | ICD-10-CM | POA: Diagnosis not present

## 2013-12-30 DIAGNOSIS — M2550 Pain in unspecified joint: Secondary | ICD-10-CM | POA: Diagnosis not present

## 2014-01-01 DIAGNOSIS — M545 Low back pain, unspecified: Secondary | ICD-10-CM | POA: Diagnosis not present

## 2014-01-01 DIAGNOSIS — R293 Abnormal posture: Secondary | ICD-10-CM | POA: Diagnosis not present

## 2014-01-01 DIAGNOSIS — M2550 Pain in unspecified joint: Secondary | ICD-10-CM | POA: Diagnosis not present

## 2014-01-01 DIAGNOSIS — M2560 Stiffness of unspecified joint, not elsewhere classified: Secondary | ICD-10-CM | POA: Diagnosis not present

## 2014-01-06 DIAGNOSIS — R293 Abnormal posture: Secondary | ICD-10-CM | POA: Diagnosis not present

## 2014-01-06 DIAGNOSIS — M545 Low back pain, unspecified: Secondary | ICD-10-CM | POA: Diagnosis not present

## 2014-01-06 DIAGNOSIS — M2560 Stiffness of unspecified joint, not elsewhere classified: Secondary | ICD-10-CM | POA: Diagnosis not present

## 2014-01-06 DIAGNOSIS — M2550 Pain in unspecified joint: Secondary | ICD-10-CM | POA: Diagnosis not present

## 2014-01-08 ENCOUNTER — Institutional Professional Consult (permissible substitution): Payer: Medicare Other | Admitting: Internal Medicine

## 2014-01-08 DIAGNOSIS — R293 Abnormal posture: Secondary | ICD-10-CM | POA: Diagnosis not present

## 2014-01-08 DIAGNOSIS — M2560 Stiffness of unspecified joint, not elsewhere classified: Secondary | ICD-10-CM | POA: Diagnosis not present

## 2014-01-08 DIAGNOSIS — M2550 Pain in unspecified joint: Secondary | ICD-10-CM | POA: Diagnosis not present

## 2014-01-08 DIAGNOSIS — M545 Low back pain, unspecified: Secondary | ICD-10-CM | POA: Diagnosis not present

## 2014-01-12 DIAGNOSIS — M545 Low back pain, unspecified: Secondary | ICD-10-CM | POA: Diagnosis not present

## 2014-01-12 DIAGNOSIS — M25559 Pain in unspecified hip: Secondary | ICD-10-CM | POA: Diagnosis not present

## 2014-01-12 DIAGNOSIS — M2560 Stiffness of unspecified joint, not elsewhere classified: Secondary | ICD-10-CM | POA: Diagnosis not present

## 2014-01-12 DIAGNOSIS — R293 Abnormal posture: Secondary | ICD-10-CM | POA: Diagnosis not present

## 2014-01-13 ENCOUNTER — Encounter: Payer: Self-pay | Admitting: Internal Medicine

## 2014-01-13 ENCOUNTER — Other Ambulatory Visit: Payer: Self-pay

## 2014-01-13 ENCOUNTER — Ambulatory Visit (INDEPENDENT_AMBULATORY_CARE_PROVIDER_SITE_OTHER): Payer: Medicare Other | Admitting: Internal Medicine

## 2014-01-13 VITALS — BP 116/54 | HR 62 | Temp 97.4°F | Ht 61.0 in | Wt 184.2 lb

## 2014-01-13 DIAGNOSIS — I209 Angina pectoris, unspecified: Secondary | ICD-10-CM | POA: Diagnosis not present

## 2014-01-13 DIAGNOSIS — R197 Diarrhea, unspecified: Secondary | ICD-10-CM | POA: Diagnosis not present

## 2014-01-13 DIAGNOSIS — R1319 Other dysphagia: Secondary | ICD-10-CM

## 2014-01-13 NOTE — Progress Notes (Signed)
Pt is set up BPE on 01/21/14 @ 8:30. Pt is aware

## 2014-01-13 NOTE — Patient Instructions (Addendum)
Barium pill esophogram  - solid food /  Pill dysphagia  GI pathogen panel - non-bloody diarrhea  Continue imodium as needed for diarrhea  Continue Protonix 40 mg daily   Further recommendations to follow

## 2014-01-13 NOTE — Progress Notes (Signed)
Primary Care Physician:  [name] Primary Gastroenterologist:  Dr. Gala Romney  Pre-Procedure History & Physical: HPI:  [name] is a 80 y.o. female here for for followup of GERD. She has 2 new symptoms one of nonbloody diarrhea for 3 months and recurrent esophageal dysphagia. Patient complains of her throat "closing off" when she last flat. Difficulty swallowing foods from time to time but not on a regular basis. Typical reflux symptoms well controlled on Protonix 40 mg once daily. EGD demonstrated no esophageal obstruction. 2 French Maloney dilator passed empirically with improvement dysphagia. Colonoscopy 2000 demonstrated multiple hyperplastic polyps and diverticulosis. Earlier this year patient underwent cardiac cath and stent placed. Now on Plavix. Chest pain improved but she has significant limitations secondary to dyspnea. Tells me she cannot walk more than 20 feet without having to rest. Was on antibiotics earlier in the year for a sinus infection.  Past Medical History  Diagnosis Date  . PVD (peripheral vascular disease)   . Other and unspecified hyperlipidemia   . Coronary atherosclerosis of native coronary artery     nonobstructive CAD by multiple catheterizations  . Panic disorder without agoraphobia   . HTN (hypertension)     Hx of it  . MI (myocardial infarction) 2006  . Internal hemorrhoids without mention of complication   . Dysphagia, unspecified(787.20)   . Microscopic colitis 2003  . GERD (gastroesophageal reflux disease)     Hx Schatzki's ring, multiple EGD/ED last 01/06/2004  . Hyperlipemia   . Thyroid disease     recent abnl TSH per pt  . S/P colonoscopy 09/27/2001    internal hemorrhoids, desc colon inflam polyp, SB BX-chronic duodenitis, colitis  . Arthritis   . Shortness of breath   . Heart disease   . Bursitis     left shoulder  . Hyperlipidemia   . Sleep disorder     obstructive  . COPD (chronic obstructive pulmonary disease)   .  Complication of anesthesia   . PONV (postoperative nausea and vomiting)     'a little nausea"  . Anxiety   . Depression   . Pneumonia 12/2011  . Heart murmur     'a littel'  . Cataract     Past Surgical History  Procedure Laterality Date  . Tonsillectomy    . Unspecified area, hysterectomy  1972    partial  . Cholecystectomy  1998  . Right knee replacement  2007  . Right leg benign tumor    . Breast lumpectomy  1998    left, benign  . Left hand surgery    . Left rotator cuff surgery    . Nasal sinus surgery    . Bladder tack  06/2010  . Cardiac catheterization    . Maloney dilation  03/16/2011    Gastritis. No H.pylori on bx. 77F maloney dilation with disruption of  occult cevical esophageal web  . Colonoscopy  03/16/2011    multiple hyperplastic colon polyps, sigmoid diverticulosis, melanosis coli  . Bladder suspension  11/09/2011    Procedure: TRANSVAGINAL TAPE (TVT) PROCEDURE;  Surgeon: Marissa Nestle, MD;  Location: [facility];  Service: Urology;  Laterality: N/A;  . Abdominal hysterectomy    . Appendectomy    . Carpal tunnel release  1989    left  . Anterior and posterior repair      with resection of vagina  . Cardiac catheterization    . Shoulder surgery Left   . Joint replacement Right 2007  . Lumbar  laminectomy/decompression microdiscectomy N/A 10/11/2012    Procedure: LUMBAR LAMINECTOMY/DECOMPRESSION MICRODISCECTOMY 2 LEVELS;  Surgeon: Floyce Stakes, MD;  Location: [facility];  Service: Neurosurgery;  Laterality: N/A;  L3-4 L4-5 Laminectomy    Prior to Admission medications   Medication Sig Start Date End Date Taking? Authorizing Provider  acetaminophen (TYLENOL) 500 MG tablet Take 500 mg by mouth every 8 (eight) hours as needed for moderate pain.   Yes Historical Provider, MD  ALPRAZolam Duanne Moron) 0.25 MG tablet Take 0.25 mg by mouth daily as needed for anxiety.    Yes Historical Provider, MD  alum & mag hydroxide-simeth (MAALOX/MYLANTA) 200-200-20 MG/5ML  suspension Take 30 mLs by mouth every 6 (six) hours as needed for indigestion or heartburn (dyspepsia). 11/26/13  Yes Lezlie Octave Black, NP  amLODipine (NORVASC) 10 MG tablet Take 1 tablet (10 mg total) by mouth daily. 08/20/13  Yes Herminio Commons, MD  aspirin 81 MG EC tablet Take 81 mg by mouth every morning.    Yes Historical Provider, MD  bisacodyl (DULCOLAX) 10 MG suppository Place 1 suppository (10 mg total) rectally daily as needed for moderate constipation. 11/26/13  Yes Lezlie Octave Black, NP  carvedilol (COREG) 12.5 MG tablet TAKE ONE TABLET BY MOUTH TWICE DAILY 12/08/13  Yes Herminio Commons, MD  clopidogrel (PLAVIX) 75 MG tablet TAKE ONE TABLET BY MOUTH DAILY WITH BREAKFAST 11/07/13  Yes Herminio Commons, MD  CRESTOR 5 MG tablet Take 5 mg by mouth daily. 10/24/13  Yes Historical Provider, MD  DULoxetine (CYMBALTA) 60 MG capsule Take 60 mg by mouth at bedtime.    Yes Historical Provider, MD  fluticasone (VERAMYST) 27.5 MCG/SPRAY nasal spray Place 1 spray into the nose daily.   Yes Historical Provider, MD  furosemide (LASIX) 20 MG tablet Take 1 tablet (20 mg total) by mouth daily. 07/31/13  Yes Herminio Commons, MD  nitroGLYCERIN (NITROSTAT) 0.4 MG SL tablet Place 1 tablet (0.4 mg total) under the tongue every 5 (five) minutes as needed. For chest pain 05/29/13  Yes Lendon Colonel, NP  pantoprazole (PROTONIX) 40 MG tablet Take 40 mg by mouth every morning.    Yes Historical Provider, MD  polyethylene glycol (MIRALAX / GLYCOLAX) packet Take 17 g by mouth daily as needed. For constipation   Yes Historical Provider, MD  POTASSIUM GLUCONATE PO Take 99 mg by mouth every morning.    Yes Historical Provider, MD  RANEXA 500 MG 12 hr tablet TAKE ONE TABLET BY MOUTH TWICE DAILY 08/26/13  Yes Herminio Commons, MD    Allergies as of 01/13/2014 - Review Complete 01/13/2014  Allergen Reaction Noted  . Phenothiazines Anaphylaxis and Hives 08/16/2011  . Polysorbate Anaphylaxis 08/16/2011  .  Prednisone Shortness Of Breath   . Codeine Itching   . Prochlorperazine Other (See Comments) 08/16/2011  . Atorvastatin Hives 08/16/2011  . Ofloxacin Rash   . Other Itching and Rash 10/10/2012  . Penicillins Other (See Comments) 11/09/2011  . Pimozide Hives and Itching 08/16/2011    Family History  Problem Relation Age of Onset  . Coronary artery disease Other     family Hx-sons  . Cancer Other   . Stroke Other     family Hx  . Hypertension Other     family Hx  . Diabetes Brother     History   Social History  . Marital Status: Divorced    Spouse Name: N/A    Number of Children: 43  . Years of Education: N/A  Occupational History  . retired    Social History Main Topics  . Smoking status: Former Smoker -- 1.00 packs/day for 64 years    Types: Cigarettes    Start date: 12/24/1947    Quit date: 11/17/2001  . Smokeless tobacco: Never Used     Comment: no tobacco  . Alcohol Use: No  . Drug Use: No  . Sexual Activity: No   Other Topics Concern  . Not on file   Social History Narrative   Divorced.   Sister had colon perforation & died from complications in [place], [place]    Review of Systems: See HPI, otherwise negative ROS  Physical Exam: BP 116/54  Pulse 62  Temp(Src) 97.4 F (36.3 C) (Oral)  Ht 5\' 1"  (1.549 m)  Wt 184 lb 3.2 oz (83.553 kg)  BMI 34.82 kg/m2 General:   Pleasant, Skin:  Intact without significant lesions or rashes. Eyes:  Sclera clear, no icterus.   Conjunctiva pink. Ears:  Normal auditory acuity. Nose:  No deformity, discharge,  or lesions. Mouth:  No deformity or lesions. Neck:  Supple; no masses or thyromegaly. No significant cervical adenopathy. Lungs:  Clear throughout to auscultation.   No wheezes, crackles, or rhonchi. No acute distress. Heart:  Regular rate and rhythm; no murmurs, clicks, rubs,  or gallops. Abdomen: Non-distended, normal bowel sounds.  Soft and nontender without appreciable mass or hepatosplenomegaly.  Pulses:   Normal pulses noted. Extremities:  Without clubbing or edema.  Impression:  Pleasant 80 year old lady with coronary artery disease status post stenting earlier this year now on Plavix. Complains of "throat closing off" with sometimes some perceived difficulty swallowing solid food. No esophageal obstruction at EGD 2012. Previously, she Schatzki's ring is dilated on multiple occasions. Significantly limited exercise capacity secondary dyspnea on exertion in spite of stent placement earlier this year. She's on Plavix which complicates the issue somewhat. She may have globus or an underlying esophageal motility disorder. Development of a structural lesion since her last EGD not excluded at this time.  I agree that further evaluation is warranted..  Three-month history of nonbloody diarrhea. Antibiotic exposure earlier in the year. She does not at all appear acutely ill or toxic.   Recommendations: Barium pill esophogram  - solid food /  Pill dysphagia  GI pathogen panel - non-bloody diarrhea  Continue imodium as needed for diarrhea  Continue Protonix 40 mg daily   Further recommendations to follow   Notice: This dictation was prepared with Dragon dictation along with smaller phrase technology. Any transcriptional errors that result from this process are unintentional and may not be corrected upon review.

## 2014-01-21 ENCOUNTER — Ambulatory Visit (HOSPITAL_COMMUNITY)
Admission: RE | Admit: 2014-01-21 | Discharge: 2014-01-21 | Disposition: A | Discharge status: Home / Self Care | Payer: Medicare Other | Source: Ambulatory Visit | Attending: Internal Medicine | Admitting: Internal Medicine

## 2014-01-21 DIAGNOSIS — R1310 Dysphagia, unspecified: Secondary | ICD-10-CM

## 2014-01-21 DIAGNOSIS — R1319 Other dysphagia: Secondary | ICD-10-CM | POA: Diagnosis not present

## 2014-01-21 DIAGNOSIS — K219 Gastro-esophageal reflux disease without esophagitis: Secondary | ICD-10-CM | POA: Diagnosis not present

## 2014-01-21 DIAGNOSIS — K224 Dyskinesia of esophagus: Secondary | ICD-10-CM | POA: Insufficient documentation

## 2014-01-22 DIAGNOSIS — M545 Low back pain: Secondary | ICD-10-CM | POA: Diagnosis not present

## 2014-01-22 DIAGNOSIS — M2560 Stiffness of unspecified joint, not elsewhere classified: Secondary | ICD-10-CM | POA: Diagnosis not present

## 2014-01-22 DIAGNOSIS — M25559 Pain in unspecified hip: Secondary | ICD-10-CM | POA: Diagnosis not present

## 2014-01-22 DIAGNOSIS — R293 Abnormal posture: Secondary | ICD-10-CM | POA: Diagnosis not present

## 2014-01-27 DIAGNOSIS — M25559 Pain in unspecified hip: Secondary | ICD-10-CM | POA: Diagnosis not present

## 2014-01-27 DIAGNOSIS — M545 Low back pain: Secondary | ICD-10-CM | POA: Diagnosis not present

## 2014-01-27 DIAGNOSIS — R293 Abnormal posture: Secondary | ICD-10-CM | POA: Diagnosis not present

## 2014-01-27 DIAGNOSIS — M2560 Stiffness of unspecified joint, not elsewhere classified: Secondary | ICD-10-CM | POA: Diagnosis not present

## 2014-01-28 DIAGNOSIS — R293 Abnormal posture: Secondary | ICD-10-CM | POA: Diagnosis not present

## 2014-01-28 DIAGNOSIS — M25559 Pain in unspecified hip: Secondary | ICD-10-CM | POA: Diagnosis not present

## 2014-01-28 DIAGNOSIS — M545 Low back pain: Secondary | ICD-10-CM | POA: Diagnosis not present

## 2014-01-28 DIAGNOSIS — M2560 Stiffness of unspecified joint, not elsewhere classified: Secondary | ICD-10-CM | POA: Diagnosis not present

## 2014-01-30 DIAGNOSIS — H6503 Acute serous otitis media, bilateral: Secondary | ICD-10-CM | POA: Diagnosis not present

## 2014-01-30 DIAGNOSIS — J019 Acute sinusitis, unspecified: Secondary | ICD-10-CM | POA: Diagnosis not present

## 2014-02-10 DIAGNOSIS — M2560 Stiffness of unspecified joint, not elsewhere classified: Secondary | ICD-10-CM | POA: Diagnosis not present

## 2014-02-10 DIAGNOSIS — R293 Abnormal posture: Secondary | ICD-10-CM | POA: Diagnosis not present

## 2014-02-10 DIAGNOSIS — M545 Low back pain: Secondary | ICD-10-CM | POA: Diagnosis not present

## 2014-02-10 DIAGNOSIS — M25559 Pain in unspecified hip: Secondary | ICD-10-CM | POA: Diagnosis not present

## 2014-02-11 DIAGNOSIS — M2560 Stiffness of unspecified joint, not elsewhere classified: Secondary | ICD-10-CM | POA: Diagnosis not present

## 2014-02-11 DIAGNOSIS — M545 Low back pain: Secondary | ICD-10-CM | POA: Diagnosis not present

## 2014-02-11 DIAGNOSIS — M25559 Pain in unspecified hip: Secondary | ICD-10-CM | POA: Diagnosis not present

## 2014-02-11 DIAGNOSIS — R293 Abnormal posture: Secondary | ICD-10-CM | POA: Diagnosis not present

## 2014-02-12 ENCOUNTER — Encounter: Payer: Self-pay | Admitting: Internal Medicine

## 2014-02-12 ENCOUNTER — Ambulatory Visit (INDEPENDENT_AMBULATORY_CARE_PROVIDER_SITE_OTHER)
Admission: RE | Admit: 2014-02-12 | Discharge: 2014-02-12 | Disposition: A | Discharge status: Home / Self Care | Payer: Medicare Other | Source: Ambulatory Visit | Attending: Internal Medicine | Admitting: Internal Medicine

## 2014-02-12 ENCOUNTER — Ambulatory Visit (INDEPENDENT_AMBULATORY_CARE_PROVIDER_SITE_OTHER): Payer: Medicare Other | Admitting: Internal Medicine

## 2014-02-12 VITALS — BP 124/60 | HR 65 | Ht 61.0 in | Wt 190.0 lb

## 2014-02-12 DIAGNOSIS — I209 Angina pectoris, unspecified: Secondary | ICD-10-CM | POA: Diagnosis not present

## 2014-02-12 DIAGNOSIS — R0600 Dyspnea, unspecified: Secondary | ICD-10-CM | POA: Diagnosis not present

## 2014-02-12 DIAGNOSIS — R0602 Shortness of breath: Secondary | ICD-10-CM | POA: Diagnosis not present

## 2014-02-12 NOTE — Assessment & Plan Note (Addendum)
-   02/12/2014  Walked RA x 1 lap  @ 185 ft each stopped due to  Sob, no desat, moderate pace  - 02/12/2014 spirometry >   FEV1  1.41 (83%) ratio 83   Symptoms are markedly disproportionate to objective findings and not clear this is a lung problem but pt does appear to have difficult airway management issues. DDX of  difficult airways management all start with A and  include Adherence, Ace Inhibitors, Acid Reflux, Active Sinus Disease, Alpha 1 Antitripsin deficiency, Anxiety masquerading as Airways dz,  ABPA,  allergy(esp in young), Aspiration (esp in elderly), Adverse effects of DPI,  Active smokers, plus two Bs  = Bronchiectasis and Beta blocker use..and one C= CHF  ? Acid (or non-acid) GERD > always difficult to exclude as up to 75% of pts in some series report no assoc GI/ Heartburn symptoms> rec max (24h)  acid suppression and diet restrictions/ reviewed and instructions given in writing.   ? Anxiety/ depression/ deconditioning all playing a role but not easily corrected.   ? chf > note breathing was better p stopped smoking until cardiac issues and wt gain > already f/u by cards, not decompensated at present  No evidence of a pulmonary problem here so f/u can be prn.

## 2014-02-12 NOTE — Patient Instructions (Addendum)
Weight control is simply a matter of calorie balance which needs to be tilted in your favor by eating less and exercising more.  To get the most out of exercise, you need to be continuously aware that you are short of breath, but never out of breath, for 30 minutes daily. As you improve, it will actually be easier for you to do the same amount of exercise  in  30 minutes so always push to the level where you are short of breath.  If this does not result in gradual weight reduction then I strongly recommend you see a nutritionist with a food diary x 2 weeks so that we can work out a negative calorie balance which is universally effective in steady weight loss programs.  Think of your calorie balance like you do your bank account where in this case you want the balance to go down so you must take in less calories than you burn up.  It's just that simple:  Hard to do, but easy to understand.  Good luck!   GERD (REFLUX)  is an extremely common cause of respiratory symptoms, many times with no significant heartburn at all.    It can be treated with medication, but also with lifestyle changes including avoidance of late meals, excessive alcohol, smoking cessation, and avoid fatty foods, chocolate, peppermint, colas, red wine, and acidic juices such as orange juice.  NO MINT OR MENTHOL PRODUCTS SO NO COUGH DROPS  USE SUGARLESS CANDY INSTEAD (jolley ranchers or Stover's)  NO OIL BASED VITAMINS - use powdered substitutes.  Take protonix 40mg  Take 30-60 min before first meal of the day and add pepcid ac 20 mg after supper to see if helps breathing or coughing or both

## 2014-02-12 NOTE — Progress Notes (Signed)
Subjective:    Patient ID: [name], female    DOB: 1932-10-01   MRN: [id number]  HPI  28 yowf healthy as child and good ex tol up to around 2000 then gradual onset  doe then stopped smoking 2003 some better to point where could walk around quarter mile track but stop x once typically  s any inhalers but heart attack 2006 and worse since then referred by Dr [name] to pulmonary clinic 02/12/2014    02/12/2014 1st [name]/ [name]   Chief Complaint  Patient presents with  . Pulmonary Consult    Referred by Dr. Phill [name]. Pt c/o dyspnea since 2007 "when started having heart trouble"- worse since Aug 2015. She states she is SOB all of the time- with or without exertion. She has to sleep propped up on 3 pillows.  She also c/o some hemoptysis recently- relates to sinus congestion and PND.   since Aug 2015 sob at rest sense can't get a breath in worse with walking 50 ft  No better with albuterol  Wt in 2000 was 145  Assoc with dysphagia  not on acei or gerd rx  10 min on ex bike and then legs give out    No obvious other patterns in day to day or daytime variabilty or assoc chronic cough or cp or chest tightness, subjective wheeze or  overt    hb symptoms. No unusual exp hx or h/o childhood pna/ asthma or knowledge of premature birth.  Sleeping ok without nocturnal  or early am exacerbation  of respiratory  c/o's or need for noct saba. Also denies any obvious fluctuation of symptoms with weather or environmental changes or other aggravating or alleviating factors except as outlined above   Current Medications, Allergies, Complete Past Medical History, Past Surgical History, Family History, and Social History were reviewed in [employer] record.              Review of Systems  Constitutional: Negative for fever, chills and unexpected weight change.  HENT: Positive for congestion, dental problem, postnasal drip, sinus pressure, sore  throat and trouble swallowing. Negative for ear pain, nosebleeds, rhinorrhea, sneezing and voice change.   Eyes: Negative for visual disturbance.  Respiratory: Positive for cough and shortness of breath. Negative for choking.   Cardiovascular: Positive for chest pain. Negative for leg swelling.  Gastrointestinal: Negative for vomiting, abdominal pain and diarrhea.  Genitourinary: Negative for difficulty urinating.  Musculoskeletal: Positive for arthralgias.  Skin: Negative for rash.  Neurological: Negative for tremors, syncope and headaches.  Hematological: Does not bruise/bleed easily.       Objective:   Physical Exam  Wt Readings from Last 3 Encounters:  02/12/14 190 lb (86.183 kg)  01/13/14 184 lb 3.2 oz (83.553 kg)  12/23/13 184 lb (83.462 kg)     Obese wf nad  HEENT: edentulous, nl turbinates, and orophanx. Nl external ear canals without cough reflex   NECK :  without JVD/Nodes/TM/ nl carotid upstrokes bilaterally   LUNGS: no acc muscle use, clear to A and P bilaterally without cough on insp or exp maneuvers   CV:  RRR  no s3 or murmur or increase in P2, no edema   ABD: obese/ soft and nontender with nl excursion in the supine position. No bruits or organomegaly, bowel sounds nl  MS:  warm without deformities, calf tenderness, cyanosis or clubbing  SKIN: warm and dry without lesions    NEURO:  alert, approp,  no deficits      CXR  02/12/2014 : Central pulmonary vascular prominence without pulmonary edema. No segmental infiltrate or pneumothorax. Heart is slightly enlarged. Coronary artery calcifications.       Lab Results  Component Value Date   TSH 2.770 11/25/2013     Lab Results  Component Value Date   PROBNP 292.3 11/24/2013       Chemistry      Component Value Date/Time   NA 142 11/26/2013 0545   K 3.8 11/26/2013 0545   CL 104 11/26/2013 0545   CO2 26 11/26/2013 0545   BUN 11 11/26/2013 0545   CREATININE 1.08 11/26/2013 0545   CREATININE 1.17*  02/13/2011 1526      Component Value Date/Time   CALCIUM 8.9 11/26/2013 0545   ALKPHOS 54 02/13/2011 1526   AST 18 02/13/2011 1526   ALT 13 02/13/2011 1526   BILITOT 0.3 02/13/2011 1526        Lab Results  Component Value Date   WBC 4.9 11/26/2013   HGB 11.7* 11/26/2013   HCT 33.9* 11/26/2013   MCV 91.1 11/26/2013   PLT 256 11/26/2013        Assessment & Plan:

## 2014-02-13 NOTE — Progress Notes (Signed)
Quick Note:  Spoke with pt and notified of results per Dr. Wert. Pt verbalized understanding and denied any questions.  ______ 

## 2014-02-16 ENCOUNTER — Encounter: Payer: Self-pay | Admitting: Internal Medicine

## 2014-03-26 ENCOUNTER — Encounter (HOSPITAL_COMMUNITY): Payer: Self-pay | Admitting: Cardiovascular Disease

## 2014-03-31 ENCOUNTER — Other Ambulatory Visit: Payer: Self-pay | Admitting: Cardiovascular Disease

## 2014-04-11 ENCOUNTER — Other Ambulatory Visit: Payer: Self-pay | Admitting: Cardiovascular Disease

## 2014-04-15 DIAGNOSIS — M545 Low back pain: Secondary | ICD-10-CM | POA: Diagnosis not present

## 2014-04-15 DIAGNOSIS — N3000 Acute cystitis without hematuria: Secondary | ICD-10-CM | POA: Diagnosis not present

## 2014-04-15 DIAGNOSIS — R51 Headache: Secondary | ICD-10-CM | POA: Diagnosis not present

## 2014-04-15 DIAGNOSIS — R079 Chest pain, unspecified: Secondary | ICD-10-CM | POA: Diagnosis not present

## 2014-04-15 DIAGNOSIS — M1990 Unspecified osteoarthritis, unspecified site: Secondary | ICD-10-CM | POA: Diagnosis not present

## 2014-04-15 DIAGNOSIS — I10 Essential (primary) hypertension: Secondary | ICD-10-CM | POA: Diagnosis not present

## 2014-04-15 DIAGNOSIS — K219 Gastro-esophageal reflux disease without esophagitis: Secondary | ICD-10-CM | POA: Diagnosis not present

## 2014-04-15 DIAGNOSIS — F411 Generalized anxiety disorder: Secondary | ICD-10-CM | POA: Diagnosis not present

## 2014-04-15 DIAGNOSIS — E782 Mixed hyperlipidemia: Secondary | ICD-10-CM | POA: Diagnosis not present

## 2014-04-18 ENCOUNTER — Other Ambulatory Visit: Payer: Self-pay | Admitting: Cardiovascular Disease

## 2014-04-22 DIAGNOSIS — M545 Low back pain: Secondary | ICD-10-CM | POA: Diagnosis not present

## 2014-04-22 DIAGNOSIS — F419 Anxiety disorder, unspecified: Secondary | ICD-10-CM | POA: Diagnosis not present

## 2014-04-22 DIAGNOSIS — K580 Irritable bowel syndrome with diarrhea: Secondary | ICD-10-CM | POA: Diagnosis not present

## 2014-04-22 DIAGNOSIS — F329 Major depressive disorder, single episode, unspecified: Secondary | ICD-10-CM | POA: Diagnosis not present

## 2014-04-22 DIAGNOSIS — I5030 Unspecified diastolic (congestive) heart failure: Secondary | ICD-10-CM | POA: Diagnosis not present

## 2014-04-22 DIAGNOSIS — G4730 Sleep apnea, unspecified: Secondary | ICD-10-CM | POA: Diagnosis not present

## 2014-04-22 DIAGNOSIS — I10 Essential (primary) hypertension: Secondary | ICD-10-CM | POA: Diagnosis not present

## 2014-04-22 DIAGNOSIS — Z1389 Encounter for screening for other disorder: Secondary | ICD-10-CM | POA: Diagnosis not present

## 2014-04-22 DIAGNOSIS — K219 Gastro-esophageal reflux disease without esophagitis: Secondary | ICD-10-CM | POA: Diagnosis not present

## 2014-04-22 DIAGNOSIS — I2510 Atherosclerotic heart disease of native coronary artery without angina pectoris: Secondary | ICD-10-CM | POA: Diagnosis not present

## 2014-04-22 DIAGNOSIS — J019 Acute sinusitis, unspecified: Secondary | ICD-10-CM | POA: Diagnosis not present

## 2014-04-22 DIAGNOSIS — R079 Chest pain, unspecified: Secondary | ICD-10-CM | POA: Diagnosis not present

## 2014-04-28 ENCOUNTER — Emergency Department (HOSPITAL_COMMUNITY): Payer: Medicare Other

## 2014-04-28 ENCOUNTER — Encounter (HOSPITAL_COMMUNITY): Payer: Self-pay | Admitting: Cardiology

## 2014-04-28 ENCOUNTER — Emergency Department (HOSPITAL_COMMUNITY)
Admission: EM | Admit: 2014-04-28 | Discharge: 2014-04-28 | Disposition: A | Discharge status: Home / Self Care | Payer: Medicare Other | Attending: Emergency Medicine | Admitting: Emergency Medicine

## 2014-04-28 DIAGNOSIS — I2510 Atherosclerotic heart disease of native coronary artery without angina pectoris: Secondary | ICD-10-CM | POA: Insufficient documentation

## 2014-04-28 DIAGNOSIS — H269 Unspecified cataract: Secondary | ICD-10-CM | POA: Diagnosis not present

## 2014-04-28 DIAGNOSIS — Z87891 Personal history of nicotine dependence: Secondary | ICD-10-CM | POA: Diagnosis not present

## 2014-04-28 DIAGNOSIS — I10 Essential (primary) hypertension: Secondary | ICD-10-CM | POA: Insufficient documentation

## 2014-04-28 DIAGNOSIS — Z9889 Other specified postprocedural states: Secondary | ICD-10-CM | POA: Insufficient documentation

## 2014-04-28 DIAGNOSIS — F329 Major depressive disorder, single episode, unspecified: Secondary | ICD-10-CM | POA: Diagnosis not present

## 2014-04-28 DIAGNOSIS — M25561 Pain in right knee: Secondary | ICD-10-CM | POA: Diagnosis not present

## 2014-04-28 DIAGNOSIS — E785 Hyperlipidemia, unspecified: Secondary | ICD-10-CM | POA: Insufficient documentation

## 2014-04-28 DIAGNOSIS — Z9861 Coronary angioplasty status: Secondary | ICD-10-CM | POA: Diagnosis not present

## 2014-04-28 DIAGNOSIS — Z880 Allergy status to penicillin: Secondary | ICD-10-CM | POA: Insufficient documentation

## 2014-04-28 DIAGNOSIS — J449 Chronic obstructive pulmonary disease, unspecified: Secondary | ICD-10-CM | POA: Diagnosis not present

## 2014-04-28 DIAGNOSIS — Y998 Other external cause status: Secondary | ICD-10-CM | POA: Diagnosis not present

## 2014-04-28 DIAGNOSIS — S0990XA Unspecified injury of head, initial encounter: Secondary | ICD-10-CM | POA: Insufficient documentation

## 2014-04-28 DIAGNOSIS — W19XXXA Unspecified fall, initial encounter: Secondary | ICD-10-CM

## 2014-04-28 DIAGNOSIS — Y9289 Other specified places as the place of occurrence of the external cause: Secondary | ICD-10-CM | POA: Insufficient documentation

## 2014-04-28 DIAGNOSIS — Z8701 Personal history of pneumonia (recurrent): Secondary | ICD-10-CM | POA: Diagnosis not present

## 2014-04-28 DIAGNOSIS — Z7982 Long term (current) use of aspirin: Secondary | ICD-10-CM | POA: Diagnosis not present

## 2014-04-28 DIAGNOSIS — I739 Peripheral vascular disease, unspecified: Secondary | ICD-10-CM | POA: Insufficient documentation

## 2014-04-28 DIAGNOSIS — S79911A Unspecified injury of right hip, initial encounter: Secondary | ICD-10-CM | POA: Diagnosis not present

## 2014-04-28 DIAGNOSIS — F419 Anxiety disorder, unspecified: Secondary | ICD-10-CM | POA: Insufficient documentation

## 2014-04-28 DIAGNOSIS — Z7902 Long term (current) use of antithrombotics/antiplatelets: Secondary | ICD-10-CM | POA: Diagnosis not present

## 2014-04-28 DIAGNOSIS — M1990 Unspecified osteoarthritis, unspecified site: Secondary | ICD-10-CM | POA: Insufficient documentation

## 2014-04-28 DIAGNOSIS — R011 Cardiac murmur, unspecified: Secondary | ICD-10-CM | POA: Diagnosis not present

## 2014-04-28 DIAGNOSIS — Y9301 Activity, walking, marching and hiking: Secondary | ICD-10-CM | POA: Diagnosis not present

## 2014-04-28 DIAGNOSIS — S8001XA Contusion of right knee, initial encounter: Secondary | ICD-10-CM | POA: Insufficient documentation

## 2014-04-28 DIAGNOSIS — S8991XA Unspecified injury of right lower leg, initial encounter: Secondary | ICD-10-CM | POA: Diagnosis present

## 2014-04-28 DIAGNOSIS — Z79899 Other long term (current) drug therapy: Secondary | ICD-10-CM | POA: Diagnosis not present

## 2014-04-28 DIAGNOSIS — M7989 Other specified soft tissue disorders: Secondary | ICD-10-CM | POA: Diagnosis not present

## 2014-04-28 DIAGNOSIS — W1839XA Other fall on same level, initial encounter: Secondary | ICD-10-CM | POA: Insufficient documentation

## 2014-04-28 DIAGNOSIS — S098XXA Other specified injuries of head, initial encounter: Secondary | ICD-10-CM | POA: Diagnosis not present

## 2014-04-28 DIAGNOSIS — I252 Old myocardial infarction: Secondary | ICD-10-CM | POA: Diagnosis not present

## 2014-04-28 DIAGNOSIS — K219 Gastro-esophageal reflux disease without esophagitis: Secondary | ICD-10-CM | POA: Insufficient documentation

## 2014-04-28 DIAGNOSIS — M25551 Pain in right hip: Secondary | ICD-10-CM | POA: Diagnosis not present

## 2014-04-28 LAB — CBC WITH DIFFERENTIAL/PLATELET
Basophils Absolute: 0.1 10*3/uL (ref 0.0–0.1)
Basophils Relative: 1 % (ref 0–1)
EOS ABS: 0.2 10*3/uL (ref 0.0–0.7)
Eosinophils Relative: 3 % (ref 0–5)
HCT: 31.2 % — ABNORMAL LOW (ref 36.0–46.0)
HEMOGLOBIN: 10.7 g/dL — AB (ref 12.0–15.0)
LYMPHS ABS: 2.3 10*3/uL (ref 0.7–4.0)
Lymphocytes Relative: 31 % (ref 12–46)
MCH: 31.1 pg (ref 26.0–34.0)
MCHC: 34.3 g/dL (ref 30.0–36.0)
MCV: 90.7 fL (ref 78.0–100.0)
MONOS PCT: 9 % (ref 3–12)
Monocytes Absolute: 0.6 10*3/uL (ref 0.1–1.0)
Neutro Abs: 4.1 10*3/uL (ref 1.7–7.7)
Neutrophils Relative %: 56 % (ref 43–77)
Platelets: 272 10*3/uL (ref 150–400)
RBC: 3.44 MIL/uL — ABNORMAL LOW (ref 3.87–5.11)
RDW: 13.4 % (ref 11.5–15.5)
WBC: 7.2 10*3/uL (ref 4.0–10.5)

## 2014-04-28 LAB — BASIC METABOLIC PANEL
Anion gap: 6 (ref 5–15)
BUN: 22 mg/dL (ref 6–23)
CO2: 30 mmol/L (ref 19–32)
Calcium: 8.7 mg/dL (ref 8.4–10.5)
Chloride: 102 mEq/L (ref 96–112)
Creatinine, Ser: 1.08 mg/dL (ref 0.50–1.10)
GFR calc non Af Amer: 48 mL/min — ABNORMAL LOW (ref >90)
GFR, EST AFRICAN AMERICAN: 55 mL/min — AB (ref >90)
Glucose, Bld: 104 mg/dL — ABNORMAL HIGH (ref 70–99)
POTASSIUM: 3.5 mmol/L (ref 3.5–5.1)
Sodium: 138 mmol/L (ref 135–145)

## 2014-04-28 MED ORDER — ACETAMINOPHEN 325 MG PO TABS
650.0000 mg | ORAL_TABLET | Freq: Once | ORAL | Status: AC
  Administered 2014-04-28: 650 mg via ORAL
  Filled 2014-04-28: qty 2

## 2014-04-28 NOTE — Discharge Instructions (Signed)
If you were given medicines take as directed.  If you are on coumadin or contraceptives realize their levels and effectiveness is altered by many different medicines.  If you have any reaction (rash, tongues swelling, other) to the medicines stop taking and see a physician.   Please follow up as directed and return to the ER or see a physician for new or worsening symptoms.  Thank you. Filed Vitals:   04/28/14 1304  BP: 148/55  Pulse: 71  Temp: 97.8 F (36.6 C)  TempSrc: Oral  Resp: 20  Height: 5\' 1"  (1.549 m)  Weight: 182 lb (82.555 kg)  SpO2: 98%

## 2014-04-28 NOTE — ED Notes (Addendum)
Fall 2 hours ago.   Pt states she doesn't know how she fell. "I was just standing there and just fell in the floor".  Denies any LOC.    States she hit her right hip,  Right knee and hit the back of her head.  C/o pain to right knee.

## 2014-04-28 NOTE — ED Provider Notes (Signed)
CSN: [id number]     Arrival date & time 04/28/14  1249 History   First MD Initiated Contact with Patient 04/28/14 1434     Chief Complaint  Patient presents with  . Fall     (Consider location/radiation/quality/duration/timing/severity/associated sxs/prior Treatment) HPI Comments: 81 year old female with history of lipids, heart attack, stent, vascular disease presents after fall prior to arrival. Patient was walking and turned quickly causing her to fall to the floor. Patient denies syncope, chest pain, shortness of breath or other symptoms. Patient not sure exactly why she fell however she recalls all details. Patient has mild tenderness with palpation and movement of the right knee. Mild head injury without loss of consciousness, patient is on Plavix.  Patient is a 81 y.o. female presenting with fall. The history is provided by the patient.  Fall Pertinent negatives include no chest pain, no abdominal pain, no headaches and no shortness of breath.    Past Medical History  Diagnosis Date  . PVD (peripheral vascular disease)   . Other and unspecified hyperlipidemia   . Coronary atherosclerosis of native coronary artery     nonobstructive CAD by multiple catheterizations  . Panic disorder without agoraphobia   . HTN (hypertension)     Hx of it  . MI (myocardial infarction) 2006  . Internal hemorrhoids without mention of complication   . Dysphagia, unspecified(787.20)   . Microscopic colitis 2003  . GERD (gastroesophageal reflux disease)     Hx Schatzki's ring, multiple EGD/ED last 01/06/2004  . Hyperlipemia   . Thyroid disease     recent abnl TSH per pt  . S/P colonoscopy 09/27/2001    internal hemorrhoids, desc colon inflam polyp, SB BX-chronic duodenitis, colitis  . Arthritis   . Shortness of breath   . Heart disease   . Bursitis     left shoulder  . Hyperlipidemia   . Sleep disorder     obstructive  . COPD (chronic obstructive pulmonary disease)   . Complication of  anesthesia   . PONV (postoperative nausea and vomiting)     'a little nausea"  . Anxiety   . Depression   . Pneumonia 12/2011  . Heart murmur     'a littel'  . Cataract    Past Surgical History  Procedure Laterality Date  . Tonsillectomy    . Unspecified area, hysterectomy  1972    partial  . Cholecystectomy  1998  . Right knee replacement  2007  . Right leg benign tumor    . Breast lumpectomy  1998    left, benign  . Left hand surgery    . Left rotator cuff surgery    . Nasal sinus surgery    . Bladder tack  06/2010  . Cardiac catheterization    . Maloney dilation  03/16/2011    Gastritis. No H.pylori on bx. 63F maloney dilation with disruption of  occult cevical esophageal web  . Colonoscopy  03/16/2011    multiple hyperplastic colon polyps, sigmoid diverticulosis, melanosis coli  . Bladder suspension  11/09/2011    Procedure: TRANSVAGINAL TAPE (TVT) PROCEDURE;  Surgeon: Marissa Nestle, MD;  Location: [facility];  Service: Urology;  Laterality: N/A;  . Abdominal hysterectomy    . Appendectomy    . Carpal tunnel release  1989    left  . Anterior and posterior repair      with resection of vagina  . Cardiac catheterization    . Shoulder surgery Left   . Joint replacement Right  2007  . Lumbar laminectomy/decompression microdiscectomy N/A 10/11/2012    Procedure: LUMBAR LAMINECTOMY/DECOMPRESSION MICRODISCECTOMY 2 LEVELS;  Surgeon: Floyce Stakes, MD;  Location: [place] [facility];  Service: Neurosurgery;  Laterality: N/A;  L3-4 L4-5 Laminectomy  . Left heart catheterization with coronary angiogram N/A 05/14/2013    Procedure: LEFT HEART CATHETERIZATION WITH CORONARY ANGIOGRAM;  Surgeon: Blane Ohara, MD;  Location: [facility];  Service: Cardiovascular;  Laterality: N/A;   Family History  Problem Relation Age of Onset  . Coronary artery disease Other     family Hx-sons  . Cancer Other   . Stroke Other     family Hx  . Hypertension Other     family Hx  . Diabetes Brother     History  Substance Use Topics  . Smoking status: Former Smoker -- 1.00 packs/day for 64 years    Types: Cigarettes    Start date: 12/24/1947    Quit date: 11/17/2001  . Smokeless tobacco: Never Used  . Alcohol Use: No   OB History    Gravida Para Term Preterm AB TAB SAB Ectopic Multiple Living   8 6   2     5      Review of Systems  Constitutional: Negative for fever and chills.  HENT: Negative for congestion.   Eyes: Negative for visual disturbance.  Respiratory: Negative for shortness of breath.   Cardiovascular: Negative for chest pain.  Gastrointestinal: Negative for vomiting and abdominal pain.  Genitourinary: Negative for dysuria and flank pain.  Musculoskeletal: Positive for arthralgias. Negative for back pain, neck pain and neck stiffness.  Skin: Negative for rash.  Neurological: Negative for syncope, weakness, light-headedness and headaches.      Allergies  Phenothiazines; Polysorbate; Prednisone; Codeine; Prochlorperazine; Atorvastatin; Ofloxacin; Other; Penicillins; and Pimozide  Home Medications   Prior to Admission medications   Medication Sig Start Date End Date Taking? Authorizing Provider  amLODipine (NORVASC) 10 MG tablet Take 1 tablet (10 mg total) by mouth daily. 08/20/13  Yes Herminio Commons, MD  amoxicillin-clavulanate (AUGMENTIN) 875-125 MG per tablet Take 1 tablet by mouth 2 (two) times daily. Started 04/22/14 04/22/14  Yes Historical Provider, MD  aspirin 81 MG EC tablet Take 81 mg by mouth every morning.    Yes Historical Provider, MD  carvedilol (COREG) 12.5 MG tablet TAKE ONE TABLET BY MOUTH TWICE DAILY. 04/13/14  Yes Herminio Commons, MD  clopidogrel (PLAVIX) 75 MG tablet TAKE ONE TABLET BY MOUTH DAILY WITH BREAKFAST 11/07/13  Yes Herminio Commons, MD  CRESTOR 5 MG tablet Take 5 mg by mouth daily. 10/24/13  Yes Historical Provider, MD  DULoxetine (CYMBALTA) 60 MG capsule Take 60 mg by mouth at bedtime.    Yes Historical Provider, MD   fluticasone (VERAMYST) 27.5 MCG/SPRAY nasal spray Place 1 spray into the nose daily.   Yes Historical Provider, MD  furosemide (LASIX) 20 MG tablet TAKE ONE TABLET BY MOUTH ONCE DAILY 04/20/14  Yes Herminio Commons, MD  pantoprazole (PROTONIX) 40 MG tablet Take 40 mg by mouth every morning.    Yes Historical Provider, MD  POTASSIUM GLUCONATE PO Take 99 mg by mouth every morning.    Yes Historical Provider, MD  QC LORATADINE ALLERGY RELIEF 10 MG tablet Take 10 mg by mouth daily. 04/22/14  Yes Historical Provider, MD  RANEXA 500 MG 12 hr tablet TAKE ONE TABLET BY MOUTH TWICE DAILY. 03/31/14  Yes Herminio Commons, MD  acetaminophen (TYLENOL) 500 MG tablet Take 500 mg by  mouth every 8 (eight) hours as needed for moderate pain.    Historical Provider, MD  ALPRAZolam Duanne Moron) 0.25 MG tablet Take 0.25 mg by mouth daily as needed for anxiety.     Historical Provider, MD  alum & mag hydroxide-simeth (MAALOX/MYLANTA) 200-200-20 MG/5ML suspension Take 30 mLs by mouth every 6 (six) hours as needed for indigestion or heartburn (dyspepsia). 11/26/13   Radene Gunning, NP  bisacodyl (DULCOLAX) 10 MG suppository Place 1 suppository (10 mg total) rectally daily as needed for moderate constipation. 11/26/13   Radene Gunning, NP  cyclobenzaprine (FLEXERIL) 10 MG tablet Take 10 mg by mouth at bedtime as needed for muscle spasms.  04/22/14   Historical Provider, MD  nitroGLYCERIN (NITROSTAT) 0.4 MG SL tablet Place 1 tablet (0.4 mg total) under the tongue every 5 (five) minutes as needed. For chest pain 05/29/13   Lendon Colonel, NP  polyethylene glycol [facility] / Floria Raveling) packet Take 17 g by mouth daily as needed. For constipation    Historical Provider, MD   BP 147/63 mmHg  Pulse 64  Temp(Src) 97.8 F (36.6 C) (Oral)  Resp 14  Ht 5\' 1"  (1.549 m)  Wt 182 lb (82.555 kg)  BMI 34.41 kg/m2  SpO2 98% Physical Exam  Constitutional: She is oriented to person, place, and time. She appears well-developed and  well-nourished.  HENT:  Head: Normocephalic and atraumatic.  Eyes: Conjunctivae are normal. Right eye exhibits no discharge. Left eye exhibits no discharge.  Neck: Normal range of motion. Neck supple. No tracheal deviation present.  Cardiovascular: Normal rate and regular rhythm.   Pulmonary/Chest: Effort normal and breath sounds normal.  Abdominal: Soft. She exhibits no distension. There is no tenderness. There is no guarding.  Musculoskeletal: She exhibits edema and tenderness.  Mild tender right anterior knee with flexion, mild ecchymosis, full rom, no laxity, nv intact distal,  No focal hip tenderness bilateral with flex/ exrom Patient has no significant elbow shoulder tenderness bilateral, no midline vertebral tenderness cervical or thoracic.  Neurological: She is alert and oriented to person, place, and time. No cranial nerve deficit. GCS eye subscore is 4. GCS verbal subscore is 5. GCS motor subscore is 6.  PERRL, eomfi Equal strength bilateral UE and LE  Skin: Skin is warm. No rash noted.  Psychiatric: She has a normal mood and affect.  Nursing note and vitals reviewed.   ED Course  Procedures (including critical care time) Labs Review Labs Reviewed  BASIC METABOLIC PANEL - Abnormal; Notable for the following:    Glucose, Bld 104 (*)    GFR calc non Af Amer 48 (*)    GFR calc Af Amer 55 (*)    All other components within normal limits  CBC WITH DIFFERENTIAL - Abnormal; Notable for the following:    RBC 3.44 (*)    Hemoglobin 10.7 (*)    HCT 31.2 (*)    All other components within normal limits    Imaging Review Ct Head Wo Contrast  04/28/2014   CLINICAL DATA:  Status post fall today with a blow to the back of the head. Initial encounter.  EXAM: CT HEAD WITHOUT CONTRAST  TECHNIQUE: Contiguous axial images were obtained from the base of the skull through the vertex without intravenous contrast.  COMPARISON:  Head CT scan 12/03/2007.  FINDINGS: The calvarium is intact.  There is no evidence of acute intracranial abnormality including hemorrhage, infarct, mass lesion, mass effect, midline shift or abnormal extra-axial fluid collection. No hydrocephalus or pneumocephalus.  Atherosclerotic vascular disease is noted. Imaged paranasal sinuses and mastoid air cells are clear.  IMPRESSION: No acute abnormality.   Electronically Signed   By: Inge Rise M.D.   On: 04/28/2014 15:38   Dg Knee Complete 4 Views Right  04/28/2014   CLINICAL DATA:  Right knee pain with bruising anteriorly. Fall, landing on knee. Initial encounter.  EXAM: RIGHT KNEE - COMPLETE 4+ VIEW  COMPARISON:  03/28/2006  FINDINGS: Changes of right knee replacement. Anterior soft tissue swelling. No underlying acute bony abnormality. No acute fracture, subluxation or dislocation. No joint effusion.  IMPRESSION: Anterior soft tissue swelling. No acute bony abnormality. Remote right knee replacement.   Electronically Signed   By: Rolm Baptise M.D.   On: 04/28/2014 15:29   Dg Hip Unilat With Pelvis 1v Right  04/28/2014   CLINICAL DATA:  Acute right hip pain after falling today.  EXAM: DG HIP W/ PELVIS 1V*R*  COMPARISON:  None.  FINDINGS: No fracture or dislocation is noted. No significant degenerative changes are noted.  IMPRESSION: Normal right hip.   Electronically Signed   By: Sabino Dick M.D.   On: 04/28/2014 15:47     EKG Interpretation None      MDM   Final diagnoses:  Acute knee pain, right  Fall  Knee contusion, right, initial encounter  Acute head injury, initial encounter   Well-appearing patient, smiling joking in room presents after fall likely mechanical. Patient not sure exactly why she fell however recalls Details and only concerns right knee and head/ on plavix.  Xrays reviewed no acute fx Results and differential diagnosis were discussed with the patient/parent/guardian. Close follow up outpatient was discussed, comfortable with the plan.   Medications  acetaminophen (TYLENOL)  tablet 650 mg (650 mg Oral Given 04/28/14 1511)    Filed Vitals:   04/28/14 1304 04/28/14 1628  BP: 148/55 147/63  Pulse: 71 64  Temp: 97.8 F (36.6 C)   TempSrc: Oral   Resp: 20 14  Height: 5\' 1"  (1.549 m)   Weight: 182 lb (82.555 kg)   SpO2: 98% 98%    Final diagnoses:  Acute knee pain, right  Fall  Knee contusion, right, initial encounter  Acute head injury, initial encounter        Mariea Clonts, MD 04/28/14 1643

## 2014-05-22 DIAGNOSIS — S8001XA Contusion of right knee, initial encounter: Secondary | ICD-10-CM | POA: Diagnosis not present

## 2014-05-22 DIAGNOSIS — R040 Epistaxis: Secondary | ICD-10-CM | POA: Diagnosis not present

## 2014-05-23 DIAGNOSIS — S8001XA Contusion of right knee, initial encounter: Secondary | ICD-10-CM | POA: Diagnosis not present

## 2014-05-23 DIAGNOSIS — M25561 Pain in right knee: Secondary | ICD-10-CM | POA: Diagnosis not present

## 2014-05-23 DIAGNOSIS — Z471 Aftercare following joint replacement surgery: Secondary | ICD-10-CM | POA: Diagnosis not present

## 2014-05-23 DIAGNOSIS — Z96651 Presence of right artificial knee joint: Secondary | ICD-10-CM | POA: Diagnosis not present

## 2014-06-11 ENCOUNTER — Other Ambulatory Visit: Payer: Self-pay | Admitting: Cardiovascular Disease

## 2014-06-17 DIAGNOSIS — G4730 Sleep apnea, unspecified: Secondary | ICD-10-CM | POA: Diagnosis not present

## 2014-06-17 DIAGNOSIS — I2510 Atherosclerotic heart disease of native coronary artery without angina pectoris: Secondary | ICD-10-CM | POA: Diagnosis not present

## 2014-06-17 DIAGNOSIS — F419 Anxiety disorder, unspecified: Secondary | ICD-10-CM | POA: Diagnosis not present

## 2014-06-17 DIAGNOSIS — I10 Essential (primary) hypertension: Secondary | ICD-10-CM | POA: Diagnosis not present

## 2014-06-17 DIAGNOSIS — I5030 Unspecified diastolic (congestive) heart failure: Secondary | ICD-10-CM | POA: Diagnosis not present

## 2014-06-17 DIAGNOSIS — J019 Acute sinusitis, unspecified: Secondary | ICD-10-CM | POA: Diagnosis not present

## 2014-06-17 DIAGNOSIS — K219 Gastro-esophageal reflux disease without esophagitis: Secondary | ICD-10-CM | POA: Diagnosis not present

## 2014-06-17 DIAGNOSIS — M545 Low back pain: Secondary | ICD-10-CM | POA: Diagnosis not present

## 2014-06-17 DIAGNOSIS — K580 Irritable bowel syndrome with diarrhea: Secondary | ICD-10-CM | POA: Diagnosis not present

## 2014-06-17 DIAGNOSIS — F329 Major depressive disorder, single episode, unspecified: Secondary | ICD-10-CM | POA: Diagnosis not present

## 2014-06-17 DIAGNOSIS — S8001XA Contusion of right knee, initial encounter: Secondary | ICD-10-CM | POA: Diagnosis not present

## 2014-07-06 ENCOUNTER — Ambulatory Visit (INDEPENDENT_AMBULATORY_CARE_PROVIDER_SITE_OTHER): Payer: Medicare Other | Admitting: Gastroenterology

## 2014-07-06 ENCOUNTER — Encounter: Payer: Self-pay | Admitting: Gastroenterology

## 2014-07-06 VITALS — BP 130/51 | HR 62 | Temp 96.8°F | Ht 61.0 in | Wt 188.0 lb

## 2014-07-06 DIAGNOSIS — K625 Hemorrhage of anus and rectum: Secondary | ICD-10-CM | POA: Diagnosis not present

## 2014-07-06 DIAGNOSIS — K648 Other hemorrhoids: Secondary | ICD-10-CM | POA: Diagnosis not present

## 2014-07-06 DIAGNOSIS — K5900 Constipation, unspecified: Secondary | ICD-10-CM | POA: Diagnosis not present

## 2014-07-06 MED ORDER — HYDROCORTISONE ACETATE 25 MG RE SUPP: 25.0000 mg | Freq: Two times a day (BID) | RECTAL | Status: DC

## 2014-07-06 NOTE — Progress Notes (Signed)
Primary Care Physician:  Tawni Carnes, PA-C [facility].  Primary Gastroenterologist:  Garfield Cornea, MD   Chief Complaint  Patient presents with  . Hemorrhoids    Better, no bleeding but still swollen    HPI:  [name] is a 81 y.o. female here for further evaluation of hemorrhois.   Last seen in September 2015 for follow-up of GERD. She is also having nonbloody diarrhea and recurrent esophageal dysphagia at that time.  Having problems with hemorrhoid swelling. Stool caliber tiny. Going on for about one month. Was having some brbpr with them. Gas like pains in the lower abdomen. On suppositories once each night for three weeks.  TUCKs. Bleeding has stopped. Lot of gas. BM every other day. Some straining especially in the beginning. Eating a lot of fruit. Miralax prn only, was taking qod with recent knee injury and decreased ambulation. No other GI issues right now.   Current Outpatient Prescriptions  Medication Sig Dispense Refill  . acetaminophen (TYLENOL) 500 MG tablet Take 500 mg by mouth every 8 (eight) hours as needed for moderate pain.    [name] Kitchen ALPRAZolam (XANAX) 0.25 MG tablet Take 0.25 mg by mouth daily as needed for anxiety.     [name] Kitchen alum & mag hydroxide-simeth (MAALOX/MYLANTA) 200-200-20 MG/5ML suspension Take 30 mLs by mouth every 6 (six) hours as needed for indigestion or heartburn (dyspepsia). 355 mL 0  . amLODipine (NORVASC) 10 MG tablet Take 1 tablet (10 mg total) by mouth daily. 30 tablet 6  . aspirin 81 MG EC tablet Take 81 mg by mouth every morning.     . carvedilol (COREG) 12.5 MG tablet TAKE ONE TABLET BY MOUTH TWICE DAILY. 60 tablet 3  . clopidogrel (PLAVIX) 75 MG tablet TAKE ONE TABLET BY MOUTH DAILY WITH BREAKFAST 30 tablet 6  . CRESTOR 5 MG tablet Take 5 mg by mouth daily.    . DULoxetine (CYMBALTA) 60 MG capsule Take 60 mg by mouth at bedtime.     . fluticasone (VERAMYST) 27.5 MCG/SPRAY nasal spray Place 1 spray into the nose daily.    . furosemide (LASIX) 20 MG  tablet TAKE ONE TABLET BY MOUTH ONCE DAILY 30 tablet 6  . NON FORMULARY Gentle Laxative 10 mg suppository   One  every other night    . pantoprazole (PROTONIX) 40 MG tablet Take 40 mg by mouth every morning.     . polyethylene glycol (MIRALAX / GLYCOLAX) packet Take 17 g by mouth daily as needed. For constipation    . POTASSIUM GLUCONATE PO Take 99 mg by mouth every morning.     . QC LORATADINE ALLERGY RELIEF 10 MG tablet Take 10 mg by mouth daily.  4  . RANEXA 500 MG 12 hr tablet TAKE ONE TABLET BY MOUTH TWICE DAILY. 60 tablet 6   No current facility-administered medications for this visit.    Allergies as of 07/06/2014 - Review Complete 07/06/2014  Allergen Reaction Noted  . Levaquin [levofloxacin in d5w] Swelling 07/06/2014  . Macrodantin [nitrofurantoin macrocrystal] Swelling 07/06/2014  . Phenothiazines Anaphylaxis and Hives 08/16/2011  . Polysorbate Anaphylaxis 08/16/2011  . Prednisone Shortness Of Breath   . Buspirone Itching 07/06/2014  . Cardura [doxazosin mesylate] Itching 07/06/2014  . Codeine Itching   . Prochlorperazine Other (See Comments) 08/16/2011  . Atorvastatin Hives 08/16/2011  . Ofloxacin Rash   . Other Itching and Rash 10/10/2012  . Penicillins Other (See Comments) 11/09/2011  . Pimozide Hives and Itching 08/16/2011    Past Medical History  Diagnosis  Date  . PVD (peripheral vascular disease)   . Other and unspecified hyperlipidemia   . Coronary atherosclerosis of native coronary artery     nonobstructive CAD by multiple catheterizations  . Panic disorder without agoraphobia   . HTN (hypertension)     Hx of it  . MI (myocardial infarction) 2006  . Internal hemorrhoids without mention of complication   . Dysphagia, unspecified(787.20)   . Microscopic colitis 2003  . GERD (gastroesophageal reflux disease)     Hx Schatzki's ring, multiple EGD/ED last 01/06/2004  . Hyperlipemia   . Thyroid disease     recent abnl TSH per pt  . S/P colonoscopy 09/27/2001     internal hemorrhoids, desc colon inflam polyp, SB BX-chronic duodenitis, colitis  . Arthritis   . Shortness of breath   . Heart disease   . Bursitis     left shoulder  . Hyperlipidemia   . Sleep disorder     obstructive  . COPD (chronic obstructive pulmonary disease)   . Complication of anesthesia   . PONV (postoperative nausea and vomiting)     'a little nausea"  . Anxiety   . Depression   . Pneumonia 12/2011  . Heart murmur     'a littel'  . Cataract     Past Surgical History  Procedure Laterality Date  . Tonsillectomy    . Unspecified area, hysterectomy  1972    partial  . Cholecystectomy  1998  . Right knee replacement  2007  . Right leg benign tumor    . Breast lumpectomy  1998    left, benign  . Left hand surgery    . Left rotator cuff surgery    . Nasal sinus surgery    . Bladder tack  06/2010  . Cardiac catheterization    . Maloney dilation  03/16/2011    Gastritis. No H.pylori on bx. 74F maloney dilation with disruption of  occult cevical esophageal web  . Colonoscopy  03/16/2011    multiple hyperplastic colon polyps, sigmoid diverticulosis, melanosis coli  . Bladder suspension  11/09/2011    Procedure: TRANSVAGINAL TAPE (TVT) PROCEDURE;  Surgeon: Marissa Nestle, MD;  Location: [facility];  Service: Urology;  Laterality: N/A;  . Abdominal hysterectomy    . Appendectomy    . Carpal tunnel release  1989    left  . Anterior and posterior repair      with resection of vagina  . Cardiac catheterization    . Shoulder surgery Left   . Joint replacement Right 2007  . Lumbar laminectomy/decompression microdiscectomy N/A 10/11/2012    Procedure: LUMBAR LAMINECTOMY/DECOMPRESSION MICRODISCECTOMY 2 LEVELS;  Surgeon: Floyce Stakes, MD;  Location: [facility];  Service: Neurosurgery;  Laterality: N/A;  L3-4 L4-5 Laminectomy  . Left heart catheterization with coronary angiogram N/A 05/14/2013    Procedure: LEFT HEART CATHETERIZATION WITH CORONARY ANGIOGRAM;  Surgeon:  Blane Ohara, MD;  Location: [facility] CATH LAB;  Service: Cardiovascular;  Laterality: N/A;    Family History  Problem Relation Age of Onset  . Coronary artery disease Other     family Hx-sons  . Cancer Other   . Stroke Other     family Hx  . Hypertension Other     family Hx  . Diabetes Brother     History   Social History  . Marital Status: Divorced    Spouse Name: N/A  . Number of Children: 5  . Years of Education: N/A   Occupational History  .  retired    Social History Main Topics  . Smoking status: Former Smoker -- 1.00 packs/day for 64 years    Types: Cigarettes    Start date: 12/24/1947    Quit date: 11/17/2001  . Smokeless tobacco: Never Used     Comment: Quit smoking in 2003  . Alcohol Use: No  . Drug Use: No  . Sexual Activity: No   Other Topics Concern  . Not on file   Social History Narrative   Divorced.   Sister had colon perforation & died from complications in [place], [place]      ROS:  General: Negative for anorexia, weight loss, fever, chills, fatigue, weakness. Eyes: Negative for vision changes.  ENT: Negative for hoarseness, difficulty swallowing , nasal congestion. CV: Negative for chest pain, angina, palpitations, dyspnea on exertion, peripheral edema.  Respiratory: Negative for dyspnea at rest, dyspnea on exertion, cough, sputum, wheezing.  GI: See history of present illness. GU:  Negative for dysuria, hematuria, urinary incontinence, urinary frequency, nocturnal urination.  MS: Negative for joint pain, low back pain.  Derm: Negative for rash or itching.  Neuro: Negative for weakness, abnormal sensation, seizure, frequent headaches, memory loss, confusion.  Psych: Negative for anxiety, depression, suicidal ideation, hallucinations.  Endo: Negative for unusual weight change.  Heme: Negative for bruising or bleeding. Allergy: Negative for rash or hives.    Physical Examination:  BP 130/51 mmHg  Pulse 62  Temp(Src) 96.8 F (36 C) (Oral)   Ht 5\' 1"  (1.549 m)  Wt 188 lb (85.276 kg)  BMI 35.54 kg/m2   General: Well-nourished, well-developed in no acute distress.  Head: Normocephalic, atraumatic.   Eyes: Conjunctiva pink, no icterus. Mouth: Oropharyngeal mucosa moist and pink , no lesions erythema or exudate. Neck: Supple without thyromegaly, masses, or lymphadenopathy.  Lungs: Clear to auscultation bilaterally.  Heart: Regular rate and rhythm, no murmurs rubs or gallops.  Abdomen: Bowel sounds are normal, nontender, nondistended, no hepatosplenomegaly or masses, no abdominal bruits or    hernia , no rebound or guarding.   Rectal: hemorrhoid tissue noted at anal opening, somewhat erythematous. Posterior at 6 oclock, likely palpable internal hemorrhoids noted, slightly tender. Brown stool heme negative.  Extremities: No lower extremity edema. No clubbing or deformities.  Neuro: Alert and oriented x 4 , grossly normal neurologically.  Skin: Warm and dry, no rash or jaundice.   Psych: Alert and cooperative, normal mood and affect.

## 2014-07-06 NOTE — Assessment & Plan Note (Addendum)
Recent flare in hemorrhoids in setting of constipation and with rectal bleeding. Several illnesses lately and limited mobility like cause of constipation. Lat TCS 2012 as outlined under [place].   Start anusol HC suppositories bid for 12 days. miralax 1/2 to 1 capful daily to keep stool soft.  Patient not interested in [facility] hemorrhoid banding at this time. Return to the office in 3-4 weeks for follow up or call sooner if needed.

## 2014-07-06 NOTE — Patient Instructions (Addendum)
1. Stop suppositories you have at home. Start Anusol HC suppositories, use rectally twice daily for 12 days.  2. Continue to use miralax to keep your stool soft. Avoid straining. You can use 1/2 capful to 1 capful daily.  3. Return to the office in 3-4 weeks or call sooner if needed.

## 2014-07-07 NOTE — Progress Notes (Signed)
cc'ed to pcp °

## 2014-07-14 ENCOUNTER — Telehealth: Payer: Self-pay | Admitting: Internal Medicine

## 2014-07-14 ENCOUNTER — Encounter: Payer: Self-pay | Admitting: Cardiovascular Disease

## 2014-07-14 ENCOUNTER — Ambulatory Visit (INDEPENDENT_AMBULATORY_CARE_PROVIDER_SITE_OTHER): Payer: Medicare Other | Admitting: Cardiovascular Disease

## 2014-07-14 VITALS — BP 154/73 | HR 63 | Ht 61.0 in | Wt 185.0 lb

## 2014-07-14 DIAGNOSIS — I10 Essential (primary) hypertension: Secondary | ICD-10-CM

## 2014-07-14 DIAGNOSIS — I2510 Atherosclerotic heart disease of native coronary artery without angina pectoris: Secondary | ICD-10-CM | POA: Diagnosis not present

## 2014-07-14 DIAGNOSIS — R0602 Shortness of breath: Secondary | ICD-10-CM

## 2014-07-14 DIAGNOSIS — R0981 Nasal congestion: Secondary | ICD-10-CM

## 2014-07-14 DIAGNOSIS — R1310 Dysphagia, unspecified: Secondary | ICD-10-CM

## 2014-07-14 DIAGNOSIS — J439 Emphysema, unspecified: Secondary | ICD-10-CM | POA: Diagnosis not present

## 2014-07-14 DIAGNOSIS — I351 Nonrheumatic aortic (valve) insufficiency: Secondary | ICD-10-CM

## 2014-07-14 DIAGNOSIS — I5032 Chronic diastolic (congestive) heart failure: Secondary | ICD-10-CM

## 2014-07-14 MED ORDER — HYDROCORTISONE 2.5 % RE CREA: 1.0000 "application " | TOPICAL_CREAM | Freq: Two times a day (BID) | RECTAL | Status: DC

## 2014-07-14 NOTE — Telephone Encounter (Signed)
Take proctosol cream BID for 7 days .

## 2014-07-14 NOTE — Telephone Encounter (Signed)
MEDICATION CALLED IN FOR HER HEMORRHOIDS AND HER INSURANCE WILL NOT COVER IT,PLEASE CALL IN SOMETHING ELSE.  [name]

## 2014-07-14 NOTE — Progress Notes (Signed)
Patient ID: [name], female   DOB: 12-Sep-1932, 81 y.o.   MRN: [id number]      SUBJECTIVE: [name] underwent percutaneous coronary intervention with drug-eluting stent to the left circumflex coronary artery in January 2015. Her proximal LAD showed mild nonobstructive disease and she had moderately severe disease in a small, nondominant RCA. She also has a history of hypertension, hyperlipidemia, chronic diastolic heart failure, depression and severe panic attacks. She has a h/o dyspnea which is multifactorial in etiology, but includes COPD due to a h/o tobacco abuse. There was no ischemia seen on nuclear stress testing on 11/25/2013. Echocardiogram in August demonstrated vigorous left ventricular systolic function, EF [id number], with mild to moderate aortic regurgitation.  She continues to have shortness of breath. She is here with her daughter, Manuela Schwartz, who is a medication [employer] officer and works in home health and supervises her mother's medications. She has been having severe sinus headaches associated with nosebleeds. She previously saw Dr. Redmond Pulling in ENT but has not seen him in quite some time. Her daughter has been giving her saline nasal sprays. SBP's at home have been 150 mmHg range.     Review of Systems: As per "subjective", otherwise negative.  Allergies  Allergen Reactions  . Levaquin [Levofloxacin In D5w] Swelling  . Macrodantin [Nitrofurantoin Macrocrystal] Swelling  . Phenothiazines Anaphylaxis and Hives  . Polysorbate Anaphylaxis  . Prednisone Shortness Of Breath  . Buspirone Itching  . Cardura [Doxazosin Mesylate] Itching  . Codeine Itching  . Prochlorperazine Other (See Comments)    unknown  . Atorvastatin Hives    Cramping; tolerates Crestor ok  . Ofloxacin Rash  . Other Itching and Rash    "WOOL"= make skin look like it has been burned  . Penicillins Other (See Comments)    Causes redness all over.   . Pimozide Hives and Itching    Current Outpatient  Prescriptions  Medication Sig Dispense Refill  . acetaminophen (TYLENOL) 500 MG tablet Take 500 mg by mouth every 8 (eight) hours as needed for moderate pain.    [name] Kitchen ALPRAZolam (XANAX) 0.25 MG tablet Take 0.25 mg by mouth daily as needed for anxiety.     [name] Kitchen alum & mag hydroxide-simeth (MAALOX/MYLANTA) 200-200-20 MG/5ML suspension Take 30 mLs by mouth every 6 (six) hours as needed for indigestion or heartburn (dyspepsia). 355 mL 0  . amLODipine (NORVASC) 10 MG tablet Take 1 tablet (10 mg total) by mouth daily. 30 tablet 6  . aspirin 81 MG EC tablet Take 81 mg by mouth every morning.     . carvedilol (COREG) 12.5 MG tablet TAKE ONE TABLET BY MOUTH TWICE DAILY. 60 tablet 3  . clopidogrel (PLAVIX) 75 MG tablet TAKE ONE TABLET BY MOUTH DAILY WITH BREAKFAST 30 tablet 6  . CRESTOR 5 MG tablet Take 5 mg by mouth daily.    . DULoxetine (CYMBALTA) 60 MG capsule Take 60 mg by mouth at bedtime.     . fluticasone (VERAMYST) 27.5 MCG/SPRAY nasal spray Place 1 spray into the nose daily.    . furosemide (LASIX) 20 MG tablet TAKE ONE TABLET BY MOUTH ONCE DAILY 30 tablet 6  . hydrocortisone (ANUSOL-HC) 25 MG suppository Place 1 suppository (25 mg total) rectally 2 (two) times daily. 24 suppository 1  . hydrocortisone (PROCTOSOL HC) 2.5 % rectal cream Place 1 application rectally 2 (two) times daily. 30 g 0  . NON FORMULARY Gentle Laxative 10 mg suppository   One  every other night    . pantoprazole (  PROTONIX) 40 MG tablet Take 40 mg by mouth every morning.     . polyethylene glycol (MIRALAX / GLYCOLAX) packet Take 17 g by mouth daily as needed. For constipation    . POTASSIUM GLUCONATE PO Take 99 mg by mouth every morning.     . QC LORATADINE ALLERGY RELIEF 10 MG tablet Take 10 mg by mouth daily.  4  . RANEXA 500 MG 12 hr tablet TAKE ONE TABLET BY MOUTH TWICE DAILY. 60 tablet 6   No current facility-administered medications for this visit.    Past Medical History  Diagnosis Date  . PVD (peripheral vascular  disease)   . Other and unspecified hyperlipidemia   . Coronary atherosclerosis of native coronary artery     nonobstructive CAD by multiple catheterizations  . Panic disorder without agoraphobia   . HTN (hypertension)     Hx of it  . MI (myocardial infarction) 2006  . Internal hemorrhoids without mention of complication   . Dysphagia, unspecified(787.20)   . Microscopic colitis 2003  . GERD (gastroesophageal reflux disease)     Hx Schatzki's ring, multiple EGD/ED last 01/06/2004  . Hyperlipemia   . Thyroid disease     recent abnl TSH per pt  . S/P colonoscopy 09/27/2001    internal hemorrhoids, desc colon inflam polyp, SB BX-chronic duodenitis, colitis  . Arthritis   . Shortness of breath   . Heart disease   . Bursitis     left shoulder  . Hyperlipidemia   . Sleep disorder     obstructive  . COPD (chronic obstructive pulmonary disease)   . Complication of anesthesia   . PONV (postoperative nausea and vomiting)     'a little nausea"  . Anxiety   . Depression   . Pneumonia 12/2011  . Heart murmur     'a littel'  . Cataract     Past Surgical History  Procedure Laterality Date  . Tonsillectomy    . Unspecified area, hysterectomy  1972    partial  . Cholecystectomy  1998  . Right knee replacement  2007  . Right leg benign tumor    . Breast lumpectomy  1998    left, benign  . Left hand surgery    . Left rotator cuff surgery    . Nasal sinus surgery    . Bladder tack  06/2010  . Cardiac catheterization    . Maloney dilation  03/16/2011    Gastritis. No H.pylori on bx. 34F maloney dilation with disruption of  occult cevical esophageal web  . Colonoscopy  03/16/2011    multiple hyperplastic colon polyps, sigmoid diverticulosis, melanosis coli  . Bladder suspension  11/09/2011    Procedure: TRANSVAGINAL TAPE (TVT) PROCEDURE;  Surgeon: Marissa Nestle, MD;  Location: [facility];  Service: Urology;  Laterality: N/A;  . Abdominal hysterectomy    . Appendectomy    . Carpal  tunnel release  1989    left  . Anterior and posterior repair      with resection of vagina  . Cardiac catheterization    . Shoulder surgery Left   . Joint replacement Right 2007  . Lumbar laminectomy/decompression microdiscectomy N/A 10/11/2012    Procedure: LUMBAR LAMINECTOMY/DECOMPRESSION MICRODISCECTOMY 2 LEVELS;  Surgeon: Floyce Stakes, MD;  Location: [place] [facility];  Service: Neurosurgery;  Laterality: N/A;  L3-4 L4-5 Laminectomy  . Left heart catheterization with coronary angiogram N/A 05/14/2013    Procedure: LEFT HEART CATHETERIZATION WITH CORONARY ANGIOGRAM;  Surgeon: Blane Ohara,  MD;  Location: [place] CATH LAB;  Service: Cardiovascular;  Laterality: N/A;    History   Social History  . Marital Status: Divorced    Spouse Name: N/A  . Number of Children: 5  . Years of Education: N/A   Occupational History  . retired    Social History Main Topics  . Smoking status: Former Smoker -- 1.00 packs/day for 64 years    Types: Cigarettes    Start date: 12/24/1947    Quit date: 11/17/2001  . Smokeless tobacco: Never Used     Comment: Quit smoking in 2003  . Alcohol Use: No  . Drug Use: No  . Sexual Activity: No   Other Topics Concern  . Not on file   Social History Narrative   Divorced.   Sister had colon perforation & died from complications in [place], [place] Vitals:   07/14/14 1508  BP: 154/73  Pulse: 63  Height: 5\' 1"  (1.549 m)  Weight: 185 lb (83.915 kg)  SpO2: 98%    PHYSICAL EXAM General: NAD HEENT: Normal. Neck: No JVD, no thyromegaly. Lungs: Clear to auscultation bilaterally with normal respiratory effort. CV: Nondisplaced PMI.  Regular rate and rhythm, normal S1/S2, no S3/S4, no murmur. No pretibial or periankle edema.    Abdomen: Soft, nontender, obese, no distention.  Neurologic: Alert and oriented x 3.  Psych: Normal affect. Skin: Normal. Musculoskeletal: No gross deformities. Extremities: No clubbing or cyanosis.   ECG: Most recent ECG  reviewed.      ASSESSMENT AND PLAN: 1. CAD with DES to LCx: Will d/c Plavix and continue ASA. She is intolerant of statins, but is now on Crestor. Normal nuclear MPI study in 2015. 2. Essential HTN: This remains elevated today but is likely being driven by sinus pain and headaches. I have again recommended she see ENT. 3. Chronic diastolic heart failure: Optimal BP control is of prime importance. Continue amlodipine 10 mg daily. I am continuing Lasix 20 mg daily. As her BP is being driven by sinus pain and headaches, I have again recommended she see ENT. 4. Hyperlipidemia: Reportedly intolerant to statin therapy, but now on low-dose Crestor. LDL 103 on 11/24/13. Will repeat a lipid panel. 5. Aortic regurgitation: Will monitor clinically and with surveillance echocardiograms. 6. DOE: Will make a pulmonary referral to Dr. Luan Pulling.  Dispo: f/u 6 months.   Kate Sable, M.D., F.A.C.C.

## 2014-07-14 NOTE — Telephone Encounter (Signed)
Routing to refill box for new rx.

## 2014-07-14 NOTE — Patient Instructions (Signed)
   Referral to Pulmonology - Dr. Luan Pulling  Stop Plavix Continue all other medications.   Patient can call Dr. Redmond Pulling for appointment as she has seen him in the past.  If need our assistance, please call the office. Continue all other medications.   Your physician wants you to follow up in: 6 months.  You will receive a reminder letter in the mail one-two months in advance.  If you don't receive a letter, please call our office to schedule the follow up appointment

## 2014-07-29 ENCOUNTER — Emergency Department (HOSPITAL_COMMUNITY): Payer: Medicare Other

## 2014-07-29 ENCOUNTER — Encounter (HOSPITAL_COMMUNITY): Payer: Self-pay | Admitting: *Deleted

## 2014-07-29 ENCOUNTER — Inpatient Hospital Stay (HOSPITAL_COMMUNITY)
Admission: EM | Admit: 2014-07-29 | Discharge: 2014-07-31 | DRG: 293 | Disposition: A | Discharge status: Home Health | Payer: Medicare Other | Attending: Internal Medicine | Admitting: Internal Medicine

## 2014-07-29 DIAGNOSIS — G479 Sleep disorder, unspecified: Secondary | ICD-10-CM | POA: Diagnosis present

## 2014-07-29 DIAGNOSIS — I509 Heart failure, unspecified: Secondary | ICD-10-CM | POA: Insufficient documentation

## 2014-07-29 DIAGNOSIS — I252 Old myocardial infarction: Secondary | ICD-10-CM | POA: Diagnosis not present

## 2014-07-29 DIAGNOSIS — Z87891 Personal history of nicotine dependence: Secondary | ICD-10-CM | POA: Diagnosis not present

## 2014-07-29 DIAGNOSIS — Z888 Allergy status to other drugs, medicaments and biological substances status: Secondary | ICD-10-CM | POA: Diagnosis not present

## 2014-07-29 DIAGNOSIS — I10 Essential (primary) hypertension: Secondary | ICD-10-CM | POA: Diagnosis present

## 2014-07-29 DIAGNOSIS — Z955 Presence of coronary angioplasty implant and graft: Secondary | ICD-10-CM

## 2014-07-29 DIAGNOSIS — R079 Chest pain, unspecified: Secondary | ICD-10-CM | POA: Diagnosis not present

## 2014-07-29 DIAGNOSIS — E876 Hypokalemia: Secondary | ICD-10-CM | POA: Diagnosis not present

## 2014-07-29 DIAGNOSIS — R0609 Other forms of dyspnea: Secondary | ICD-10-CM | POA: Diagnosis not present

## 2014-07-29 DIAGNOSIS — I739 Peripheral vascular disease, unspecified: Secondary | ICD-10-CM | POA: Diagnosis present

## 2014-07-29 DIAGNOSIS — I5033 Acute on chronic diastolic (congestive) heart failure: Principal | ICD-10-CM | POA: Diagnosis present

## 2014-07-29 DIAGNOSIS — Z8701 Personal history of pneumonia (recurrent): Secondary | ICD-10-CM | POA: Diagnosis not present

## 2014-07-29 DIAGNOSIS — F329 Major depressive disorder, single episode, unspecified: Secondary | ICD-10-CM | POA: Diagnosis present

## 2014-07-29 DIAGNOSIS — Z880 Allergy status to penicillin: Secondary | ICD-10-CM | POA: Diagnosis not present

## 2014-07-29 DIAGNOSIS — I5031 Acute diastolic (congestive) heart failure: Secondary | ICD-10-CM

## 2014-07-29 DIAGNOSIS — J449 Chronic obstructive pulmonary disease, unspecified: Secondary | ICD-10-CM | POA: Diagnosis present

## 2014-07-29 DIAGNOSIS — Z86010 Personal history of colonic polyps: Secondary | ICD-10-CM

## 2014-07-29 DIAGNOSIS — M1990 Unspecified osteoarthritis, unspecified site: Secondary | ICD-10-CM | POA: Diagnosis present

## 2014-07-29 DIAGNOSIS — Z9049 Acquired absence of other specified parts of digestive tract: Secondary | ICD-10-CM | POA: Diagnosis present

## 2014-07-29 DIAGNOSIS — Z96651 Presence of right artificial knee joint: Secondary | ICD-10-CM | POA: Diagnosis present

## 2014-07-29 DIAGNOSIS — Z90710 Acquired absence of both cervix and uterus: Secondary | ICD-10-CM

## 2014-07-29 DIAGNOSIS — Z885 Allergy status to narcotic agent status: Secondary | ICD-10-CM | POA: Diagnosis not present

## 2014-07-29 DIAGNOSIS — F410 Panic disorder [episodic paroxysmal anxiety] without agoraphobia: Secondary | ICD-10-CM | POA: Diagnosis present

## 2014-07-29 DIAGNOSIS — Z881 Allergy status to other antibiotic agents status: Secondary | ICD-10-CM | POA: Diagnosis not present

## 2014-07-29 DIAGNOSIS — I2510 Atherosclerotic heart disease of native coronary artery without angina pectoris: Secondary | ICD-10-CM | POA: Diagnosis not present

## 2014-07-29 DIAGNOSIS — E785 Hyperlipidemia, unspecified: Secondary | ICD-10-CM | POA: Diagnosis present

## 2014-07-29 DIAGNOSIS — R0602 Shortness of breath: Secondary | ICD-10-CM | POA: Diagnosis not present

## 2014-07-29 DIAGNOSIS — K219 Gastro-esophageal reflux disease without esophagitis: Secondary | ICD-10-CM | POA: Diagnosis present

## 2014-07-29 DIAGNOSIS — F419 Anxiety disorder, unspecified: Secondary | ICD-10-CM | POA: Diagnosis present

## 2014-07-29 DIAGNOSIS — Z7982 Long term (current) use of aspirin: Secondary | ICD-10-CM | POA: Diagnosis not present

## 2014-07-29 LAB — CBC WITH DIFFERENTIAL/PLATELET
BASOS PCT: 1 % (ref 0–1)
Basophils Absolute: 0.1 10*3/uL (ref 0.0–0.1)
Eosinophils Absolute: 0.2 10*3/uL (ref 0.0–0.7)
Eosinophils Relative: 2 % (ref 0–5)
HCT: 25.8 % — ABNORMAL LOW (ref 36.0–46.0)
Hemoglobin: 8.5 g/dL — ABNORMAL LOW (ref 12.0–15.0)
Lymphocytes Relative: 31 % (ref 12–46)
Lymphs Abs: 2.2 10*3/uL (ref 0.7–4.0)
MCH: 26.8 pg (ref 26.0–34.0)
MCHC: 32.9 g/dL (ref 30.0–36.0)
MCV: 81.4 fL (ref 78.0–100.0)
MONO ABS: 0.5 10*3/uL (ref 0.1–1.0)
MONOS PCT: 8 % (ref 3–12)
NEUTROS ABS: 4.1 10*3/uL (ref 1.7–7.7)
Neutrophils Relative %: 58 % (ref 43–77)
Platelets: 297 10*3/uL (ref 150–400)
RBC: 3.17 MIL/uL — ABNORMAL LOW (ref 3.87–5.11)
RDW: 15.5 % (ref 11.5–15.5)
WBC: 7.1 10*3/uL (ref 4.0–10.5)

## 2014-07-29 LAB — URINALYSIS, ROUTINE W REFLEX MICROSCOPIC
Bilirubin Urine: NEGATIVE
GLUCOSE, UA: NEGATIVE mg/dL
Hgb urine dipstick: NEGATIVE
Ketones, ur: NEGATIVE mg/dL
Leukocytes, UA: NEGATIVE
Nitrite: NEGATIVE
PH: 5 (ref 5.0–8.0)
PROTEIN: NEGATIVE mg/dL
SPECIFIC GRAVITY, URINE: 1.025 (ref 1.005–1.030)
Urobilinogen, UA: 0.2 mg/dL (ref 0.0–1.0)

## 2014-07-29 LAB — COMPREHENSIVE METABOLIC PANEL
ALK PHOS: 71 U/L (ref 39–117)
ALT: 14 U/L (ref 0–35)
ANION GAP: 10 (ref 5–15)
AST: 16 U/L (ref 0–37)
Albumin: 3.6 g/dL (ref 3.5–5.2)
BILIRUBIN TOTAL: 0.6 mg/dL (ref 0.3–1.2)
BUN: 18 mg/dL (ref 6–23)
CHLORIDE: 102 mmol/L (ref 96–112)
CO2: 24 mmol/L (ref 19–32)
Calcium: 8.5 mg/dL (ref 8.4–10.5)
Creatinine, Ser: 1.04 mg/dL (ref 0.50–1.10)
GFR calc non Af Amer: 50 mL/min — ABNORMAL LOW (ref >90)
GFR, EST AFRICAN AMERICAN: 58 mL/min — AB (ref >90)
GLUCOSE: 104 mg/dL — AB (ref 70–99)
POTASSIUM: 3.8 mmol/L (ref 3.5–5.1)
Sodium: 136 mmol/L (ref 135–145)
Total Protein: 7 g/dL (ref 6.0–8.3)

## 2014-07-29 LAB — BRAIN NATRIURETIC PEPTIDE: B Natriuretic Peptide: 280 pg/mL — ABNORMAL HIGH (ref 0.0–100.0)

## 2014-07-29 LAB — I-STAT TROPONIN, ED: Troponin i, poc: 0 ng/mL (ref 0.00–0.08)

## 2014-07-29 LAB — D-DIMER, QUANTITATIVE: D-Dimer, Quant: 0.83 ug/mL-FEU — ABNORMAL HIGH (ref 0.00–0.48)

## 2014-07-29 MED ORDER — FUROSEMIDE 10 MG/ML IJ SOLN
40.0000 mg | Freq: Once | INTRAMUSCULAR | Status: AC
  Administered 2014-07-29: 40 mg via INTRAVENOUS
  Filled 2014-07-29: qty 4

## 2014-07-29 MED ORDER — IOHEXOL 350 MG/ML SOLN
100.0000 mL | Freq: Once | INTRAVENOUS | Status: AC | PRN
  Administered 2014-07-29: 100 mL via INTRAVENOUS

## 2014-07-29 NOTE — ED Provider Notes (Signed)
CSN: [id number]     Arrival date & time 07/29/14  1454 History   First MD Initiated Contact with Patient 07/29/14 1627     Chief Complaint  Patient presents with  . Chest Pain     (Consider location/radiation/quality/duration/timing/severity/associated sxs/prior Treatment) HPI   [name] is a 81 y.o. female who is here for evaluation of some breath, chest discomfort, and neck pain, present for several months. She has been evaluated several times by her PCP. She states that she had shortness of breath prior to having a cardiac problem which resulted in the stent, therefore, she is worried about her heart is causing the shortness of breath. She is taking her usual medications. She recently was taken off her Plavix, after completing one year on it following her cardiac stent. She denies fever, chills, nausea, vomiting, dysuria, or change in bowel habits. She is a persistent cough which is productive of white sputum. She is taking her usual medications. There are no other known modifying factors.   Past Medical History  Diagnosis Date  . PVD (peripheral vascular disease)   . Other and unspecified hyperlipidemia   . Coronary atherosclerosis of native coronary artery     nonobstructive CAD by multiple catheterizations  . Panic disorder without agoraphobia   . HTN (hypertension)     Hx of it  . MI (myocardial infarction) 2006  . Internal hemorrhoids without mention of complication   . Dysphagia, unspecified(787.20)   . Microscopic colitis 2003  . GERD (gastroesophageal reflux disease)     Hx Schatzki's ring, multiple EGD/ED last 01/06/2004  . Hyperlipemia   . Thyroid disease     recent abnl TSH per pt  . S/P colonoscopy 09/27/2001    internal hemorrhoids, desc colon inflam polyp, SB BX-chronic duodenitis, colitis  . Arthritis   . Shortness of breath   . Heart disease   . Bursitis     left shoulder  . Hyperlipidemia   . Sleep disorder     obstructive  . COPD (chronic  obstructive pulmonary disease)   . Complication of anesthesia   . PONV (postoperative nausea and vomiting)     'a little nausea"  . Anxiety   . Depression   . Pneumonia 12/2011  . Heart murmur     'a littel'  . Cataract    Past Surgical History  Procedure Laterality Date  . Tonsillectomy    . Unspecified area, hysterectomy  1972    partial  . Cholecystectomy  1998  . Right knee replacement  2007  . Right leg benign tumor    . Breast lumpectomy  1998    left, benign  . Left hand surgery    . Left rotator cuff surgery    . Nasal sinus surgery    . Bladder tack  06/2010  . Maloney dilation  03/16/2011    Gastritis. No H.pylori on bx. 24F maloney dilation with disruption of  occult cevical esophageal web  . Colonoscopy  03/16/2011    multiple hyperplastic colon polyps, sigmoid diverticulosis, melanosis coli  . Bladder suspension  11/09/2011    Procedure: TRANSVAGINAL TAPE (TVT) PROCEDURE;  Surgeon: Marissa Nestle, MD;  Location: [facility];  Service: Urology;  Laterality: N/A;  . Abdominal hysterectomy    . Appendectomy    . Carpal tunnel release  1989    left  . Anterior and posterior repair      with resection of vagina  . Shoulder surgery Left   .  Joint replacement Right 2007  . Lumbar laminectomy/decompression microdiscectomy N/A 10/11/2012    Procedure: LUMBAR LAMINECTOMY/DECOMPRESSION MICRODISCECTOMY 2 LEVELS;  Surgeon: Floyce Stakes, MD;  Location: [place] [facility];  Service: Neurosurgery;  Laterality: N/A;  L3-4 L4-5 Laminectomy  . Left heart catheterization with coronary angiogram N/A 05/14/2013    Procedure: LEFT HEART CATHETERIZATION WITH CORONARY ANGIOGRAM;  Surgeon: Blane Ohara, MD;  Location: [facility] C[facility];  Service: Cardiovascular;  Laterality: N/A;  . Cardiac catheterization    . Cardiac catheterization    . Coronary angioplasty with stent placement     Family History  Problem Relation Age of Onset  . Coronary artery disease Other     family Hx-sons  .  Cancer Other   . Stroke Other     family Hx  . Hypertension Other     family Hx  . Diabetes Brother    History  Substance Use Topics  . Smoking status: Former Smoker -- 1.00 packs/day for 64 years    Types: Cigarettes    Start date: 12/24/1947    Quit date: 11/17/2001  . Smokeless tobacco: Never Used     Comment: Quit smoking in 2003  . Alcohol Use: No   OB History    Gravida Para Term Preterm AB TAB SAB Ectopic Multiple Living   8 6   2     5      Review of Systems  All other systems reviewed and are negative.     Allergies  Levaquin; Macrodantin; Phenothiazines; Polysorbate; Prednisone; Buspirone; Cardura; Codeine; Prochlorperazine; Atorvastatin; Ofloxacin; Other; Penicillins; and Pimozide  Home Medications   Prior to Admission medications   Medication Sig Start Date End Date Taking? Authorizing Provider  ALPRAZolam Duanne Moron) 0.25 MG tablet Take 0.25 mg by mouth daily as needed for anxiety.    Yes Historical Provider, MD  amLODipine (NORVASC) 10 MG tablet Take 1 tablet (10 mg total) by mouth daily. 08/20/13  Yes Herminio Commons, MD  aspirin 81 MG EC tablet Take 81 mg by mouth every morning.    Yes Historical Provider, MD  carvedilol (COREG) 12.5 MG tablet TAKE ONE TABLET BY MOUTH TWICE DAILY. 04/13/14  Yes Herminio Commons, MD  CRESTOR 5 MG tablet Take 5 mg by mouth every morning.  10/24/13  Yes Historical Provider, MD  DULoxetine (CYMBALTA) 60 MG capsule Take 60 mg by mouth at bedtime.    Yes Historical Provider, MD  fluticasone (VERAMYST) 27.5 MCG/SPRAY nasal spray Place 1 spray into the nose daily.   Yes Historical Provider, MD  furosemide (LASIX) 20 MG tablet TAKE ONE TABLET BY MOUTH ONCE DAILY 04/20/14  Yes Herminio Commons, MD  hydrocortisone (PROCTOSOL HC) 2.5 % rectal cream Place 1 application rectally 2 (two) times daily. 07/14/14  Yes Orvil Feil, NP  pantoprazole (PROTONIX) 40 MG tablet Take 40 mg by mouth every morning.    Yes Historical Provider, MD   polyethylene glycol (MIRALAX / GLYCOLAX) packet Take 17 g by mouth daily as needed. For constipation   Yes Historical Provider, MD  POTASSIUM GLUCONATE PO Take 99 mg by mouth every morning.    Yes Historical Provider, MD  psyllium (METAMUCIL) 58.6 % powder Take 1 packet by mouth daily as needed (FOR CONSTIPATION).   Yes Historical Provider, MD  QC LORATADINE ALLERGY RELIEF 10 MG tablet Take 10 mg by mouth daily. 04/22/14  Yes Historical Provider, MD  RANEXA 500 MG 12 hr tablet TAKE ONE TABLET BY MOUTH TWICE DAILY. 03/31/14  Yes Herminio Commons, MD  alum & mag hydroxide-simeth (MAALOX/MYLANTA) 200-200-20 MG/5ML suspension Take 30 mLs by mouth every 6 (six) hours as needed for indigestion or heartburn (dyspepsia). Patient not taking: Reported on 07/29/2014 11/26/13   Radene Gunning, NP  hydrocortisone (ANUSOL-HC) 25 MG suppository Place 1 suppository (25 mg total) rectally 2 (two) times daily. Patient not taking: Reported on 07/29/2014 07/06/14   Mahala Menghini, PA-C  NON FORMULARY 1 suppository. Gentle Laxative 10 mg suppository   One  every other night    Historical Provider, MD   BP 152/59 mmHg  Pulse 70  Temp(Src) 98.2 F (36.8 C) (Oral)  Resp 18  Ht 5\' 1"  (1.549 m)  Wt 185 lb (83.915 kg)  BMI 34.97 kg/m2  SpO2 96% Physical Exam  Constitutional: She is oriented to person, place, and time. She appears well-developed.  Elderly, frail  HENT:  Head: Normocephalic and atraumatic.  Right Ear: External ear normal.  Left Ear: External ear normal.  Eyes: Conjunctivae and EOM are normal. Pupils are equal, round, and reactive to light.  Neck: Normal range of motion and phonation normal. Neck supple.  Cardiovascular: Normal rate, regular rhythm and normal heart sounds.   No JVD.  Pulmonary/Chest: Effort normal. No respiratory distress. She has no wheezes. She has rales. She exhibits no tenderness and no bony tenderness.  Scattered rales.  Abdominal: Soft. There is no tenderness.   Musculoskeletal: Normal range of motion.  1+ pitting edema both lower legs. No calf tenderness. No popliteal mass.  Neurological: She is alert and oriented to person, place, and time. No cranial nerve deficit or sensory deficit. She exhibits normal muscle tone. Coordination normal.  Skin: Skin is warm, dry and intact.  Psychiatric: She has a normal mood and affect. Her behavior is normal. Judgment and thought content normal.  Nursing note and vitals reviewed.   ED Course  Procedures (including critical care time)  Record review indicates recent cardiology evaluation.  Medications  iohexol (OMNIPAQUE) 350 MG/ML injection 100 mL (100 mLs Intravenous Contrast Given 07/29/14 2111)  furosemide (LASIX) injection 40 mg (40 mg Intravenous Given 07/29/14 2357)    Patient Vitals for the past 24 hrs:  BP Temp Temp src Pulse Resp SpO2 Height Weight  07/29/14 2322 152/59 mmHg 98.2 F (36.8 C) Oral 70 18 96 % - -  07/29/14 2000 (!) 147/53 mmHg - - 73 23 97 % - -  07/29/14 1930 155/70 mmHg - - 72 24 96 % - -  07/29/14 1900 150/72 mmHg - - 71 16 97 % - -  07/29/14 1851 151/59 mmHg - - 66 18 94 % - -  07/29/14 1630 149/60 mmHg - - 65 24 99 % - -  07/29/14 1615 - - - 66 20 98 % - -  07/29/14 1600 145/60 mmHg - - 65 22 99 % - -  07/29/14 1545 - - - 66 21 97 % - -  07/29/14 1530 149/57 mmHg - - 65 23 98 % - -  07/29/14 1515 - - - 66 24 96 % - -  07/29/14 1457 (!) 138/51 mmHg 98.1 F (36.7 C) Oral 70 22 97 % 5\' 1"  (1.549 m) 185 lb (83.915 kg)    11:43 PM-Consult complete with Hospitalist. Patient case explained and discussed. She agrees to admit patient for further evaluation and treatment. Call ended at 2350   11:43 PM Reevaluation with update and discussion. After initial assessment and treatment, an updated evaluation she remains comfortable  on nasal cannula oxygen. Findings discussed with patient and family member, all questions answered. Michi Herrmann L   CRITICAL CARE Performed by:  Daleen Bo L Total critical care time: 35 minutes Critical care time was exclusive of separately billable procedures and treating other patients. Critical care was necessary to treat or prevent imminent or life-threatening deterioration. Critical care was time spent personally by me on the following activities: development of treatment plan with patient and/or surrogate as well as nursing, discussions with consultants, evaluation of patient's response to treatment, examination of patient, obtaining history from patient or surrogate, ordering and performing treatments and interventions, ordering and review of laboratory studies, ordering and review of radiographic studies, pulse oximetry and re-evaluation of patient's condition.   Labs Review Labs Reviewed  COMPREHENSIVE METABOLIC PANEL - Abnormal; Notable for the following:    Glucose, Bld 104 (*)    GFR calc non Af Amer 50 (*)    GFR calc Af Amer 58 (*)    All other components within normal limits  CBC WITH DIFFERENTIAL/PLATELET - Abnormal; Notable for the following:    RBC 3.17 (*)    Hemoglobin 8.5 (*)    HCT 25.8 (*)    All other components within normal limits  BRAIN NATRIURETIC PEPTIDE - Abnormal; Notable for the following:    B Natriuretic Peptide 280.0 (*)    All other components within normal limits  D-DIMER, QUANTITATIVE - Abnormal; Notable for the following:    D-Dimer, Quant 0.83 (*)    All other components within normal limits  URINE CULTURE  URINALYSIS, ROUTINE W REFLEX MICROSCOPIC  I-STAT TROPOININ, ED    Imaging Review Dg Chest 2 View  07/29/2014   CLINICAL DATA:  Chest pain and shortness of Breath  EXAM: CHEST  2 VIEW  COMPARISON:  02/12/2014  FINDINGS: Heart size is normal. There is mild diffuse pulmonary edema, new from previous exam. Small bilateral pleural effusions identified. No airspace consolidation.  IMPRESSION: Mild congestive heart failure.   Electronically Signed   By: Kerby Moors M.D.   On:  07/29/2014 18:30   Ct Angio Chest Pe W/cm &/or Wo Cm  07/29/2014   CLINICAL DATA:  Worsening shortness of Breath and weakness today.  EXAM: CT ANGIOGRAPHY CHEST WITH CONTRAST  TECHNIQUE: Multidetector CT imaging of the chest was performed using the standard protocol during bolus administration of intravenous contrast. Multiplanar CT image reconstructions and MIPs were obtained to evaluate the vascular anatomy.  CONTRAST:  191mL OMNIPAQUE IOHEXOL 350 MG/ML SOLN  COMPARISON:  12/12/2013  FINDINGS: Chest wall: No breast masses, supraclavicular or axillary adenopathy. The thyroid gland is grossly normal. The bony thorax is intact.  Mediastinum: The heart is normal in size. No pericardial effusion. There arm borderline enlarged mediastinal and hilar lymph nodes. No mass is identified. The aorta is normal in caliber. Moderate atherosclerotic calcifications. No dissection. Fairly extensive 3 vessel coronary artery calcifications. The esophagus is grossly normal.  The pulmonary arterial tree is fairly well opacified. No filling defects to suggest pulmonary emboli.  Lungs/ pleura: Patchy eat ground-glass opacity and small airspace nodules are noted along with peribronchial thickening and increased interstitial markings. There are also bilateral pleural effusions. Findings most likely represent pulmonary edema/CHF. No focal airspace consolidation. No worrisome pulmonary lesions.  Upper abdomen: No significant findings. Advanced atherosclerotic calcifications noted involving the aorta and branch vessels.  Review of the MIP images confirms the above findings.  IMPRESSION: 1. No CT findings for pulmonary embolism. 2. Findings most suggestive of congestive heart  failure with pulmonary edema and pleural effusions. Prominent mediastinal and hilar lymph nodes are likely a secondary finding. 3. Atherosclerotic changes involving the aorta and branch vessels but no dissection.   Electronically Signed   By: Marijo Sanes M.D.   On:  07/29/2014 21:52     EKG Interpretation   Date/Time:  Wednesday July 29 2014 18:43:03 EDT Ventricular Rate:  68 PR Interval:  163 QRS Duration: 83 QT Interval:  463 QTC Calculation: 492 R Axis:   13 Text Interpretation:  Sinus rhythm Low voltage, precordial leads  Borderline prolonged QT interval Since last tracing of earlier today No  significant change was found Confirmed by Eulis Foster  MD, Shamiracle Gorden [phone]) on  07/29/2014 6:58:28 PM       EKG Interpretation  Date/Time:  Wednesday July 29 2014 18:43:03 EDT Ventricular Rate:  68 PR Interval:  163 QRS Duration: 83 QT Interval:  463 QTC Calculation: 492 R Axis:   13 Text Interpretation:  Sinus rhythm Low voltage, precordial leads Borderline prolonged QT interval Since last tracing of earlier today No significant change was found Confirmed by Eulis Foster  MD, Aariya Ferrick [id number]) on 07/29/2014 6:58:28 PM         MDM   Final diagnoses:  Acute diastolic congestive heart failure  Dyspnea on exertion    Shortness of breath with exertion, and diastolic congestive heart failure on CT imaging. BNP is normal. She saw her cardiologist 2 weeks ago. At that time, he continued her usual medications, including Lasix. He felt that controlling her blood pressure was the best treatment for her heart failure. Doubt ACS, metabolic instability or serious bacterial infection. Patient will need to be admitted for diuresis, and cardiology consultation.   Nursing Notes Reviewed/ Care Coordinated, and agree without changes. Applicable Imaging Reviewed.  Interpretation of Laboratory Data incorporated into ED treatment  Plan: Admit  Daleen Bo, MD 07/30/14 [phone]

## 2014-07-29 NOTE — ED Notes (Signed)
Pt ambulated to bathroom without difficulty.

## 2014-07-29 NOTE — ED Notes (Signed)
Sob , chest , neck pain, for 3 mos.  Recent told she was having "panic attacks"

## 2014-07-29 NOTE — ED Notes (Signed)
Pt's O2 sats 92% after ambulating to stretcher, placed pt on 2L O2 [place], sats improved to 99%.

## 2014-07-30 ENCOUNTER — Encounter (HOSPITAL_COMMUNITY): Payer: Self-pay | Admitting: *Deleted

## 2014-07-30 DIAGNOSIS — R0609 Other forms of dyspnea: Secondary | ICD-10-CM

## 2014-07-30 DIAGNOSIS — R0602 Shortness of breath: Secondary | ICD-10-CM

## 2014-07-30 DIAGNOSIS — I5033 Acute on chronic diastolic (congestive) heart failure: Secondary | ICD-10-CM | POA: Diagnosis present

## 2014-07-30 LAB — TROPONIN I
Troponin I: <0.03 ng/mL (ref <0.031)
Troponin I: <0.03 ng/mL (ref <0.031)

## 2014-07-30 MED ORDER — CARVEDILOL 12.5 MG PO TABS
12.5000 mg | ORAL_TABLET | Freq: Two times a day (BID) | ORAL | Status: DC
  Administered 2014-07-30 – 2014-07-31 (×4): 12.5 mg via ORAL
  Filled 2014-07-30 (×4): qty 1

## 2014-07-30 MED ORDER — ONDANSETRON HCL 4 MG/2ML IJ SOLN: 4.0000 mg | Freq: Four times a day (QID) | INTRAMUSCULAR | Status: DC | PRN

## 2014-07-30 MED ORDER — FUROSEMIDE 10 MG/ML IJ SOLN
40.0000 mg | Freq: Two times a day (BID) | INTRAMUSCULAR | Status: DC
  Filled 2014-07-30: qty 4

## 2014-07-30 MED ORDER — ACETAMINOPHEN 325 MG PO TABS
650.0000 mg | ORAL_TABLET | ORAL | Status: DC | PRN
  Administered 2014-07-30: 650 mg via ORAL
  Filled 2014-07-30: qty 2

## 2014-07-30 MED ORDER — AMLODIPINE BESYLATE 5 MG PO TABS
10.0000 mg | ORAL_TABLET | Freq: Every day | ORAL | Status: DC
  Administered 2014-07-30 – 2014-07-31 (×2): 10 mg via ORAL
  Filled 2014-07-30 (×2): qty 2

## 2014-07-30 MED ORDER — ASPIRIN EC 81 MG PO TBEC
81.0000 mg | DELAYED_RELEASE_TABLET | Freq: Every day | ORAL | Status: DC
  Administered 2014-07-30 – 2014-07-31 (×2): 81 mg via ORAL
  Filled 2014-07-30 (×2): qty 1

## 2014-07-30 MED ORDER — SODIUM CHLORIDE 0.9 % IJ SOLN: 3.0000 mL | INTRAMUSCULAR | Status: DC | PRN

## 2014-07-30 MED ORDER — SODIUM CHLORIDE 0.9 % IV SOLN
INTRAVENOUS | Status: AC
  Administered 2014-07-30: 02:00:00 via INTRAVENOUS

## 2014-07-30 MED ORDER — PANTOPRAZOLE SODIUM 40 MG PO TBEC
40.0000 mg | DELAYED_RELEASE_TABLET | Freq: Every morning | ORAL | Status: DC
  Administered 2014-07-30 – 2014-07-31 (×2): 40 mg via ORAL
  Filled 2014-07-30 (×2): qty 1

## 2014-07-30 MED ORDER — SODIUM CHLORIDE 0.9 % IJ SOLN
3.0000 mL | Freq: Two times a day (BID) | INTRAMUSCULAR | Status: DC
  Administered 2014-07-30 – 2014-07-31 (×3): 3 mL via INTRAVENOUS

## 2014-07-30 MED ORDER — SODIUM CHLORIDE 0.9 % IV SOLN: 250.0000 mL | INTRAVENOUS | Status: DC | PRN

## 2014-07-30 MED ORDER — FUROSEMIDE 10 MG/ML IJ SOLN
40.0000 mg | Freq: Every day | INTRAMUSCULAR | Status: DC
  Administered 2014-07-30: 40 mg via INTRAVENOUS
  Filled 2014-07-30: qty 4

## 2014-07-30 MED ORDER — ENOXAPARIN SODIUM 40 MG/0.4ML ~~LOC~~ SOLN
40.0000 mg | Freq: Every day | SUBCUTANEOUS | Status: DC
  Administered 2014-07-30: 40 mg via SUBCUTANEOUS
  Filled 2014-07-30: qty 0.4

## 2014-07-30 MED ORDER — DULOXETINE HCL 60 MG PO CPEP
60.0000 mg | ORAL_CAPSULE | Freq: Every day | ORAL | Status: DC
  Administered 2014-07-30 (×2): 60 mg via ORAL
  Filled 2014-07-30 (×2): qty 1

## 2014-07-30 MED ORDER — RANOLAZINE ER 500 MG PO TB12
500.0000 mg | ORAL_TABLET | Freq: Two times a day (BID) | ORAL | Status: DC
  Administered 2014-07-30 – 2014-07-31 (×4): 500 mg via ORAL
  Filled 2014-07-30 (×4): qty 1

## 2014-07-30 NOTE — Care Management Utilization Note (Deleted)
UR completed 

## 2014-07-30 NOTE — Care Management Utilization Note (Signed)
UR completed 

## 2014-07-30 NOTE — Progress Notes (Signed)
[facility] PROGRESS NOTE  [name] [id number] DOB: 1933/03/13 DOA: 07/29/2014 PCP: [name]  Assessment/Plan: 1. Acute on chronic diastolic congestive heart failure -Patient presenting with clinical signs symptoms consistent with congestive heart failure. Last transthoracic echocardiogram was performed on 11/25/2013 that showed an ejection fraction of 65-70% with indeterminate diastolic function. -Overnight she diuresed 1.35 L after initiation of IV Lasix. -She takes 20 mg of Lasix by mouth daily, will continue IV Lasix 40 mg IV daily and continue to monitor clinical response along with ins and outs. Labs showing stable kidney function. -Follow-up on repeat transthoracic echocardiogram that has been ordered for today.  2.  Coronary artery disease. -Patient with an established history of coronary artery disease status post percutaneous intervention with drug-eluting stent to left circumflex and January 2015. He presently denies chest pain, had 2 negative troponins. -Her last appointment with cardiology was on 07/14/2014 at which time her Plavix was discontinued and she was maintained on aspirin therapy alone. -Continue beta blocker  3.  Hypertension. -Patient currently on carvedilol 12.5 mg by mouth twice a day, amlodipine 10 mg by mouth daily, having last blood pressure 127/62. Will continue current regimen  4. Gastroesophageal reflux disease -Continue proton pump inhibitor therapy    Code Status: Full code Family Communication:  Disposition Plan: Continue diuresis, anticipate transitioning to oral Lasix in a.m. if continues to improve     HPI/Subjective: Patient is a pleasant 81 year old female with a past medical history of chronic diastolic congestive heart failure who presented to the emergency department on 07/30/2014 with complaints of worsening shortness of breath associated with weight gain and bilateral lower extremity edema. He reports compliance  to all of her medications including Lasix which she takes 20 mg by mouth daily. She was started on IV Lasix 40 mg reporting significant improvement by the following day. Troponins remain negative.  Objective: Filed Vitals:   07/30/14 0615  BP: 127/62  Pulse: 59  Temp: 98 F (36.7 C)  Resp: 20    Intake/Output Summary (Last 24 hours) at 07/30/14 0931 Last data filed at 07/30/14 0625  Gross per 24 hour  Intake      0 ml  Output   1350 ml  Net  -1350 ml   Filed Weights   07/29/14 1457 07/30/14 0210 07/30/14 0620  Weight: 83.915 kg (185 lb) 85.14 kg (187 lb 11.2 oz) 84.596 kg (186 lb 8 oz)    Exam:   General:  Patient states feeling better this morning, no acute distress, awake alert, mentating well  Cardiovascular: She has 2/6 systolic ejection murmur with trace edema to lower extremities bilaterally  Respiratory: Normal respiratory effort has bibasilar crackles with a few rales, on supplemental oxygen.  Abdomen: Soft nontender nondistended  Musculoskeletal: Trace edema to lower extremity bilaterally   Data Reviewed: Basic Metabolic Panel:  Recent Labs Lab 07/29/14 1838  NA 136  K 3.8  CL 102  CO2 24  GLUCOSE 104*  BUN 18  CREATININE 1.04  CALCIUM 8.5   Liver Function Tests:  Recent Labs Lab 07/29/14 1838  AST 16  ALT 14  ALKPHOS 71  BILITOT 0.6  PROT 7.0  ALBUMIN 3.6   No results for input(s): LIPASE, AMYLASE in the last 168 hours. No results for input(s): AMMONIA in the last 168 hours. CBC:  Recent Labs Lab 07/29/14 1838  WBC 7.1  NEUTROABS 4.1  HGB 8.5*  HCT 25.8*  MCV 81.4  PLT 297   Cardiac Enzymes:  Recent Labs  Lab 07/30/14 0136 07/30/14 0741  TROPONINI <0.03 <0.03   BNP (last 3 results)  Recent Labs  07/29/14 1838  BNP 280.0*    ProBNP (last 3 results)  Recent Labs  11/24/13 1110  PROBNP 292.3    CBG: No results for input(s): GLUCAP in the last 168 hours.  No results found for this or any previous visit (from  the past 240 hour(s)).   Studies: Dg Chest 2 View  07/29/2014   CLINICAL DATA:  Chest pain and shortness of Breath  EXAM: CHEST  2 VIEW  COMPARISON:  02/12/2014  FINDINGS: Heart size is normal. There is mild diffuse pulmonary edema, new from previous exam. Small bilateral pleural effusions identified. No airspace consolidation.  IMPRESSION: Mild congestive heart failure.   Electronically Signed   By: Kerby Moors M.D.   On: 07/29/2014 18:30   Ct Angio Chest Pe W/cm &/or Wo Cm  07/29/2014   CLINICAL DATA:  Worsening shortness of Breath and weakness today.  EXAM: CT ANGIOGRAPHY CHEST WITH CONTRAST  TECHNIQUE: Multidetector CT imaging of the chest was performed using the standard protocol during bolus administration of intravenous contrast. Multiplanar CT image reconstructions and MIPs were obtained to evaluate the vascular anatomy.  CONTRAST:  155mL OMNIPAQUE IOHEXOL 350 MG/ML SOLN  COMPARISON:  12/12/2013  FINDINGS: Chest wall: No breast masses, supraclavicular or axillary adenopathy. The thyroid gland is grossly normal. The bony thorax is intact.  Mediastinum: The heart is normal in size. No pericardial effusion. There arm borderline enlarged mediastinal and hilar lymph nodes. No mass is identified. The aorta is normal in caliber. Moderate atherosclerotic calcifications. No dissection. Fairly extensive 3 vessel coronary artery calcifications. The esophagus is grossly normal.  The pulmonary arterial tree is fairly well opacified. No filling defects to suggest pulmonary emboli.  Lungs/ pleura: Patchy eat ground-glass opacity and small airspace nodules are noted along with peribronchial thickening and increased interstitial markings. There are also bilateral pleural effusions. Findings most likely represent pulmonary edema/CHF. No focal airspace consolidation. No worrisome pulmonary lesions.  Upper abdomen: No significant findings. Advanced atherosclerotic calcifications noted involving the aorta and branch  vessels.  Review of the MIP images confirms the above findings.  IMPRESSION: 1. No CT findings for pulmonary embolism. 2. Findings most suggestive of congestive heart failure with pulmonary edema and pleural effusions. Prominent mediastinal and hilar lymph nodes are likely a secondary finding. 3. Atherosclerotic changes involving the aorta and branch vessels but no dissection.   Electronically Signed   By: Marijo Sanes M.D.   On: 07/29/2014 21:52    Scheduled Meds: . sodium chloride   Intravenous STAT  . amLODipine  10 mg Oral Daily  . aspirin EC  81 mg Oral Daily  . carvedilol  12.5 mg Oral BID  . DULoxetine  60 mg Oral QHS  . enoxaparin (LOVENOX) injection  40 mg Subcutaneous QHS  . furosemide  40 mg Intravenous Daily  . pantoprazole  40 mg Oral q morning - 10a  . ranolazine  500 mg Oral BID  . sodium chloride  3 mL Intravenous Q12H   Continuous Infusions:   Principal Problem:   Diastolic CHF, acute on chronic Active Problems:   CAD, NATIVE VESSEL - PCI + DES to left circumflex 05/14/13   Peripheral vascular disease   DYSPNEA   Major depression   Hypokalemia    Time spent:     Kelvin Cellar  [facility] Pager [phone] 7PM-7AM, please contact night-coverage at [email or web address], password [facility] 07/30/2014, 9:31  AM  LOS: 1 day

## 2014-07-30 NOTE — H&P (Signed)
PCP:   Tawni Carnes, PA-C   Chief Complaint:  Sob over a month  HPI: 81 yo female with sob and le edema worse for the last 3 days.  No fevers.  H/o diastolic dysfunction, only on lasix prn at home daily but has been taking it daily 20mg  for the last several days.  No chest pain.  She has had over 2 lbs wt gain.  Pt received 40 mg lasix iv in the ED and has urinated well over a liter.  She states she feels much better and almost back to her baseline.  Gets up easily from bed to bedside commode.  Review of Systems:  Positive and negative as per HPI otherwise all other systems are negative  Past Medical History: Past Medical History  Diagnosis Date  . PVD (peripheral vascular disease)   . Other and unspecified hyperlipidemia   . Coronary atherosclerosis of native coronary artery     nonobstructive CAD by multiple catheterizations  . Panic disorder without agoraphobia   . HTN (hypertension)     Hx of it  . MI (myocardial infarction) 2006  . Internal hemorrhoids without mention of complication   . Dysphagia, unspecified(787.20)   . Microscopic colitis 2003  . GERD (gastroesophageal reflux disease)     Hx Schatzki's ring, multiple EGD/ED last 01/06/2004  . Hyperlipemia   . Thyroid disease     recent abnl TSH per pt  . S/P colonoscopy 09/27/2001    internal hemorrhoids, desc colon inflam polyp, SB BX-chronic duodenitis, colitis  . Arthritis   . Shortness of breath   . Heart disease   . Bursitis     left shoulder  . Hyperlipidemia   . Sleep disorder     obstructive  . COPD (chronic obstructive pulmonary disease)   . Complication of anesthesia   . PONV (postoperative nausea and vomiting)     'a little nausea"  . Anxiety   . Depression   . Pneumonia 12/2011  . Heart murmur     'a littel'  . Cataract    Past Surgical History  Procedure Laterality Date  . Tonsillectomy    . Unspecified area, hysterectomy  1972    partial  . Cholecystectomy  1998  . Right knee  replacement  2007  . Right leg benign tumor    . Breast lumpectomy  1998    left, benign  . Left hand surgery    . Left rotator cuff surgery    . Nasal sinus surgery    . Bladder tack  06/2010  . Maloney dilation  03/16/2011    Gastritis. No H.pylori on bx. 42F maloney dilation with disruption of  occult cevical esophageal web  . Colonoscopy  03/16/2011    multiple hyperplastic colon polyps, sigmoid diverticulosis, melanosis coli  . Bladder suspension  11/09/2011    Procedure: TRANSVAGINAL TAPE (TVT) PROCEDURE;  Surgeon: Marissa Nestle, MD;  Location: [facility];  Service: Urology;  Laterality: N/A;  . Abdominal hysterectomy    . Appendectomy    . Carpal tunnel release  1989    left  . Anterior and posterior repair      with resection of vagina  . Shoulder surgery Left   . Joint replacement Right 2007  . Lumbar laminectomy/decompression microdiscectomy N/A 10/11/2012    Procedure: LUMBAR LAMINECTOMY/DECOMPRESSION MICRODISCECTOMY 2 LEVELS;  Surgeon: Floyce Stakes, MD;  Location: [facility];  Service: Neurosurgery;  Laterality: N/A;  L3-4 L4-5 Laminectomy  . Left heart catheterization with  coronary angiogram N/A 05/14/2013    Procedure: LEFT HEART CATHETERIZATION WITH CORONARY ANGIOGRAM;  Surgeon: Blane Ohara, MD;  Location: [facility] CATH LAB;  Service: Cardiovascular;  Laterality: N/A;  . Cardiac catheterization    . Cardiac catheterization    . Coronary angioplasty with stent placement      Medications: Prior to Admission medications   Medication Sig Start Date End Date Taking? Authorizing Provider  ALPRAZolam Duanne Moron) 0.25 MG tablet Take 0.25 mg by mouth daily as needed for anxiety.    Yes Historical Provider, MD  amLODipine (NORVASC) 10 MG tablet Take 1 tablet (10 mg total) by mouth daily. 08/20/13  Yes Herminio Commons, MD  aspirin 81 MG EC tablet Take 81 mg by mouth every morning.    Yes Historical Provider, MD  carvedilol (COREG) 12.5 MG tablet TAKE ONE TABLET BY MOUTH TWICE  DAILY. 04/13/14  Yes Herminio Commons, MD  CRESTOR 5 MG tablet Take 5 mg by mouth every morning.  10/24/13  Yes Historical Provider, MD  DULoxetine (CYMBALTA) 60 MG capsule Take 60 mg by mouth at bedtime.    Yes Historical Provider, MD  fluticasone (VERAMYST) 27.5 MCG/SPRAY nasal spray Place 1 spray into the nose daily.   Yes Historical Provider, MD  furosemide (LASIX) 20 MG tablet TAKE ONE TABLET BY MOUTH ONCE DAILY 04/20/14  Yes Herminio Commons, MD  hydrocortisone (PROCTOSOL HC) 2.5 % rectal cream Place 1 application rectally 2 (two) times daily. 07/14/14  Yes Orvil Feil, NP  pantoprazole (PROTONIX) 40 MG tablet Take 40 mg by mouth every morning.    Yes Historical Provider, MD  polyethylene glycol (MIRALAX / GLYCOLAX) packet Take 17 g by mouth daily as needed. For constipation   Yes Historical Provider, MD  POTASSIUM GLUCONATE PO Take 99 mg by mouth every morning.    Yes Historical Provider, MD  psyllium (METAMUCIL) 58.6 % powder Take 1 packet by mouth daily as needed (FOR CONSTIPATION).   Yes Historical Provider, MD  QC LORATADINE ALLERGY RELIEF 10 MG tablet Take 10 mg by mouth daily. 04/22/14  Yes Historical Provider, MD  RANEXA 500 MG 12 hr tablet TAKE ONE TABLET BY MOUTH TWICE DAILY. 03/31/14  Yes Herminio Commons, MD  alum & mag hydroxide-simeth (MAALOX/MYLANTA) 200-200-20 MG/5ML suspension Take 30 mLs by mouth every 6 (six) hours as needed for indigestion or heartburn (dyspepsia). Patient not taking: Reported on 07/29/2014 11/26/13   Radene Gunning, NP  hydrocortisone (ANUSOL-HC) 25 MG suppository Place 1 suppository (25 mg total) rectally 2 (two) times daily. Patient not taking: Reported on 07/29/2014 07/06/14   Mahala Menghini, PA-C  NON FORMULARY 1 suppository. Gentle Laxative 10 mg suppository   One  every other night    Historical Provider, MD    Allergies:   Allergies  Allergen Reactions  . Levaquin [Levofloxacin In D5w] Swelling  . Macrodantin [Nitrofurantoin Macrocrystal]  Swelling  . Phenothiazines Anaphylaxis and Hives  . Polysorbate Anaphylaxis  . Prednisone Shortness Of Breath  . Buspirone Itching  . Cardura [Doxazosin Mesylate] Itching  . Codeine Itching  . Prochlorperazine Other (See Comments)    unknown  . Atorvastatin Hives    Cramping; tolerates Crestor ok  . Ofloxacin Rash  . Other Itching and Rash    "WOOL"= make skin look like it has been burned  . Penicillins Other (See Comments)    Causes redness all over.   . Pimozide Hives and Itching    Social History:  reports that she  quit smoking about 12 years ago. Her smoking use included Cigarettes. She started smoking about 66 years ago. She has a 64 pack-year smoking history. She has never used smokeless tobacco. She reports that she does not drink alcohol or use illicit drugs.  Family History: Family History  Problem Relation Age of Onset  . Coronary artery disease Other     family Hx-sons  . Cancer Other   . Stroke Other     family Hx  . Hypertension Other     family Hx  . Diabetes Brother     Physical Exam: Filed Vitals:   07/29/14 1900 07/29/14 1930 07/29/14 2000 07/29/14 2322  BP: 150/72 155/70 147/53 152/59  Pulse: 71 72 73 70  Temp:    98.2 F (36.8 C)  TempSrc:    Oral  Resp: 16 24 23 18   Height:      Weight:      SpO2: 97% 96% 97% 96%   General appearance: alert, cooperative and no distress Head: Normocephalic, without obvious abnormality, atraumatic Eyes: negative Nose: Nares normal. Septum midline. Mucosa normal. No drainage or sinus tenderness. Neck: no JVD and supple, symmetrical, trachea midline Lungs: clear to auscultation bilaterally Heart: regular rate and rhythm, S1, S2 normal, no murmur, click, rub or gallop Abdomen: soft, non-tender; bowel sounds normal; no masses,  no organomegaly Extremities: edema trace to 1+ Pulses: 2+ and symmetric Skin: Skin color, texture, turgor normal. No rashes or lesions Neurologic: Grossly normal    Labs on  Admission:   Recent Labs  07/29/14 1838  NA 136  K 3.8  CL 102  CO2 24  GLUCOSE 104*  BUN 18  CREATININE 1.04  CALCIUM 8.5    Recent Labs  07/29/14 1838  AST 16  ALT 14  ALKPHOS 71  BILITOT 0.6  PROT 7.0  ALBUMIN 3.6    Recent Labs  07/29/14 1838  WBC 7.1  NEUTROABS 4.1  HGB 8.5*  HCT 25.8*  MCV 81.4  PLT 297   Radiological Exams on Admission: Dg Chest 2 View  07/29/2014   CLINICAL DATA:  Chest pain and shortness of Breath  EXAM: CHEST  2 VIEW  COMPARISON:  02/12/2014  FINDINGS: Heart size is normal. There is mild diffuse pulmonary edema, new from previous exam. Small bilateral pleural effusions identified. No airspace consolidation.  IMPRESSION: Mild congestive heart failure.   Electronically Signed   By: Kerby Moors M.D.   On: 07/29/2014 18:30   Ct Angio Chest Pe W/cm &/or Wo Cm  07/29/2014   CLINICAL DATA:  Worsening shortness of Breath and weakness today.  EXAM: CT ANGIOGRAPHY CHEST WITH CONTRAST  TECHNIQUE: Multidetector CT imaging of the chest was performed using the standard protocol during bolus administration of intravenous contrast. Multiplanar CT image reconstructions and MIPs were obtained to evaluate the vascular anatomy.  CONTRAST:  110mL OMNIPAQUE IOHEXOL 350 MG/ML SOLN  COMPARISON:  12/12/2013  FINDINGS: Chest wall: No breast masses, supraclavicular or axillary adenopathy. The thyroid gland is grossly normal. The bony thorax is intact.  Mediastinum: The heart is normal in size. No pericardial effusion. There arm borderline enlarged mediastinal and hilar lymph nodes. No mass is identified. The aorta is normal in caliber. Moderate atherosclerotic calcifications. No dissection. Fairly extensive 3 vessel coronary artery calcifications. The esophagus is grossly normal.  The pulmonary arterial tree is fairly well opacified. No filling defects to suggest pulmonary emboli.  Lungs/ pleura: Patchy eat ground-glass opacity and small airspace nodules are noted along  with  peribronchial thickening and increased interstitial markings. There are also bilateral pleural effusions. Findings most likely represent pulmonary edema/CHF. No focal airspace consolidation. No worrisome pulmonary lesions.  Upper abdomen: No significant findings. Advanced atherosclerotic calcifications noted involving the aorta and branch vessels.  Review of the MIP images confirms the above findings.  IMPRESSION: 1. No CT findings for pulmonary embolism. 2. Findings most suggestive of congestive heart failure with pulmonary edema and pleural effusions. Prominent mediastinal and hilar lymph nodes are likely a secondary finding. 3. Atherosclerotic changes involving the aorta and branch vessels but no dissection.   Electronically Signed   By: Marijo Sanes M.D.   On: 07/29/2014 21:52    Assessment/Plan  81 yo female with acute on chronic diastolic congestive heart failure  Principal Problem:   Diastolic CHF, acute on chronic-  Increase lasix to 40mg  iv q 12 hours.  chf pathway.  Last echo several years ago, repeat echo in am.  Serial troponins.  Daily aspirin.  Clarify home meds in am.  Will likely need decreasing her lasix in the next 24 hours, possibly may be able to be discharged later this evening.    Active Problems:  Stable unless o/w noted   CAD, NATIVE VESSEL - PCI + DES to left circumflex 05/14/13   Peripheral vascular disease   DYSPNEA   Major depression   Hypokalemia- repleted in ED, repeat in am  Admit to tele.  Full code.  Martice Doty A 07/30/2014, 1:19 AM

## 2014-07-30 NOTE — Progress Notes (Signed)
*  PRELIMINARY RESULTS* Echocardiogram 2D Echocardiogram has been performed.  Leavy Cella 07/30/2014, 9:19 AM

## 2014-07-31 DIAGNOSIS — I2510 Atherosclerotic heart disease of native coronary artery without angina pectoris: Secondary | ICD-10-CM

## 2014-07-31 DIAGNOSIS — R0602 Shortness of breath: Secondary | ICD-10-CM

## 2014-07-31 DIAGNOSIS — E876 Hypokalemia: Secondary | ICD-10-CM

## 2014-07-31 LAB — CBC
HEMATOCRIT: 27.3 % — AB (ref 36.0–46.0)
Hemoglobin: 8.9 g/dL — ABNORMAL LOW (ref 12.0–15.0)
MCH: 26.6 pg (ref 26.0–34.0)
MCHC: 32.6 g/dL (ref 30.0–36.0)
MCV: 81.7 fL (ref 78.0–100.0)
PLATELETS: 278 10*3/uL (ref 150–400)
RBC: 3.34 MIL/uL — ABNORMAL LOW (ref 3.87–5.11)
RDW: 15.5 % (ref 11.5–15.5)
WBC: 6.3 10*3/uL (ref 4.0–10.5)

## 2014-07-31 LAB — URINE CULTURE
Colony Count: NO GROWTH
Culture: NO GROWTH

## 2014-07-31 LAB — BASIC METABOLIC PANEL
Anion gap: 10 (ref 5–15)
BUN: 16 mg/dL (ref 6–23)
CHLORIDE: 101 mmol/L (ref 96–112)
CO2: 28 mmol/L (ref 19–32)
Calcium: 8.5 mg/dL (ref 8.4–10.5)
Creatinine, Ser: 1.12 mg/dL — ABNORMAL HIGH (ref 0.50–1.10)
GFR calc Af Amer: 53 mL/min — ABNORMAL LOW (ref >90)
GFR calc non Af Amer: 45 mL/min — ABNORMAL LOW (ref >90)
GLUCOSE: 109 mg/dL — AB (ref 70–99)
Potassium: 3.3 mmol/L — ABNORMAL LOW (ref 3.5–5.1)
Sodium: 139 mmol/L (ref 135–145)

## 2014-07-31 MED ORDER — POTASSIUM CHLORIDE CRYS ER 20 MEQ PO TBCR
40.0000 meq | EXTENDED_RELEASE_TABLET | Freq: Once | ORAL | Status: AC
  Administered 2014-07-31: 40 meq via ORAL
  Filled 2014-07-31: qty 2

## 2014-07-31 MED ORDER — FUROSEMIDE 10 MG/ML IJ SOLN
40.0000 mg | Freq: Once | INTRAMUSCULAR | Status: AC
  Administered 2014-07-31: 40 mg via INTRAVENOUS
  Filled 2014-07-31: qty 4

## 2014-07-31 MED ORDER — POTASSIUM CHLORIDE ER 10 MEQ PO TBCR: 20.0000 meq | EXTENDED_RELEASE_TABLET | Freq: Every day | ORAL | Status: DC

## 2014-07-31 MED ORDER — FUROSEMIDE 40 MG PO TABS: 40.0000 mg | ORAL_TABLET | Freq: Every day | ORAL | Status: DC

## 2014-07-31 NOTE — Progress Notes (Signed)
Pt's IV catheter removed and intact. Pt's IV site clean dry and intact. Discharge instructions, follow up appointments, and medications reviewed and discussed with patient. All questions were answered and no further questions at this time. Heart failure education performed with patient at discharge and handout given to patient. Pt verbalized understanding through teachback. Pt escorted by nurse tech.

## 2014-07-31 NOTE — Discharge Summary (Signed)
Physician Discharge Summary  [name] [id number] DOB: June 07, 1932 DOA: 07/29/2014  PCP: [name]  Admit date: 07/29/2014 Discharge date: 07/31/2014  Time spent: 35 minutes  Recommendations for Outpatient Follow-up:  1. Please follow-up on patient's volume status, she was treated for acute decompensated CHF  2. Home health services to obtain a BMP in 3-4 days, her Lasix was increased from 20 to 40 mg by mouth daily on discharge and will need a follow-up creatinine. Please send results to her cardiologist Dr Bronson Ing  3. Prior to discharge home health services were set up for home PT  Discharge Diagnoses:  Principal Problem:   Diastolic CHF, acute on chronic Active Problems:   CAD, NATIVE VESSEL - PCI + DES to left circumflex 05/14/13   Peripheral vascular disease   DYSPNEA   Major depression   Hypokalemia   Discharge Condition: Stable  Diet recommendation: Healthy diet  Filed Weights   07/30/14 0210 07/30/14 0620 07/31/14 0634  Weight: 85.14 kg (187 lb 11.2 oz) 84.596 kg (186 lb 8 oz) 84.3 kg (185 lb 13.6 oz)    History of present illness:  81 yo female with sob and le edema worse for the last 3 days. No fevers. H/o diastolic dysfunction, only on lasix prn at home daily but has been taking it daily 20mg  for the last several days. No chest pain. She has had over 2 lbs wt gain. Pt received 40 mg lasix iv in the ED and has urinated well over a liter. She states she feels much better and almost back to her baseline. Gets up easily from bed to bedside commode.  Hospital Course:  Patient is a pleasant 81 year old female with a past medical history of chronic diastolic congestive heart failure who presented to the emergency department on 07/30/2014 with complaints of worsening shortness of breath associated with weight gain and bilateral lower extremity edema. She reported compliance to all of her medications including Lasix taking 20 mg by mouth daily. She was  started on IV Lasix 40 mg reporting significant improvement by the following day. Troponins remain negative.  1. Acute on chronic diastolic congestive heart failure -Patient presenting with clinical signs symptoms consistent with congestive heart failure. Last transthoracic echocardiogram was performed on 11/25/2013 that showed an ejection fraction of 65-70% with indeterminate diastolic function. -Transthoracic echocardiogram performed 07/30/2014 showing ejection fraction 55-60% with grade 2 diastolic dysfunction -Patient having significant premature symptoms by 07/31/2014, responded well to IV diuresis. -She was discharged on 40 mg by mouth daily of Lasix (previous had been taking 20 mg of Lasix daily)  -Please follow-up on BMP in 3-4 days -Will need close follow with Dr Bronson Ing at the cardiology clinic.   2. Coronary artery disease. -Patient with an established history of coronary artery disease status post percutaneous intervention with drug-eluting stent to left circumflex and January 2015. He presently denies chest pain, had 2 negative troponins. -Her last appointment with cardiology was on 07/14/2014 at which time her Plavix was discontinued and she was maintained on aspirin therapy alone. -Continue beta blocker  3. Hypertension. -Patient currently on carvedilol 12.5 mg by mouth twice a day, amlodipine 10 mg by mouth daily, having last blood pressure 127/62. Will continue current regimen  4. Gastroesophageal reflux disease -Continue proton pump inhibitor therapy   Procedures:  Thoracic echocardiogram performed on 07/31/2014 impression: Left ventricle: The cavity size was normal. Wall thickness was normal. Systolic function was normal. The estimated ejection fraction was in the range of 55% to 60%.  Wall motion was normal; there were no regional wall motion abnormalities. Features are consistent with a pseudonormal left ventricular filling pattern, with concomitant  abnormal relaxation and increased filling pressure (grade 2 diastolic dysfunction).   Discharge Exam: Filed Vitals:   07/31/14 0634  BP: 115/41  Pulse: 61  Temp: 98.1 F (36.7 C)  Resp: 20    General: Patient states feeling better, she and related down the hallway maintain oxygen saturation is in the mid 90s. Cardiovascular: Regular rate and rhythm normal S1-S2 Respiratory: Improved lung exam, with better air movement and improvement to crackles and rales Abdomen: Soft nontender nondistended Extremities: No edema  Discharge Instructions   Discharge Instructions    Call MD for:  difficulty breathing, headache or visual disturbances    Complete by:  As directed      Call MD for:  extreme fatigue    Complete by:  As directed      Call MD for:  hives    Complete by:  As directed      Call MD for:  persistant dizziness or light-headedness    Complete by:  As directed      Call MD for:  persistant nausea and vomiting    Complete by:  As directed      Call MD for:  redness, tenderness, or signs of infection (pain, swelling, redness, odor or green/yellow discharge around incision site)    Complete by:  As directed      Call MD for:  severe uncontrolled pain    Complete by:  As directed      Call MD for:  temperature >100.4    Complete by:  As directed      Diet - low sodium heart healthy    Complete by:  As directed      Increase activity slowly    Complete by:  As directed           Current Discharge Medication List    CONTINUE these medications which have CHANGED   Details  furosemide (LASIX) 40 MG tablet Take 1 tablet (40 mg total) by mouth daily. Qty: 30 tablet, Refills: 1      CONTINUE these medications which have NOT CHANGED   Details  ALPRAZolam (XANAX) 0.25 MG tablet Take 0.25 mg by mouth daily as needed for anxiety.     amLODipine (NORVASC) 10 MG tablet Take 1 tablet (10 mg total) by mouth daily. Qty: 30 tablet, Refills: 6    aspirin 81 MG EC tablet  Take 81 mg by mouth every morning.     carvedilol (COREG) 12.5 MG tablet TAKE ONE TABLET BY MOUTH TWICE DAILY. Qty: 60 tablet, Refills: 3    CRESTOR 5 MG tablet Take 5 mg by mouth every morning.     DULoxetine (CYMBALTA) 60 MG capsule Take 60 mg by mouth at bedtime.     fluticasone (VERAMYST) 27.5 MCG/SPRAY nasal spray Place 1 spray into the nose daily.    hydrocortisone (PROCTOSOL HC) 2.5 % rectal cream Place 1 application rectally 2 (two) times daily. Qty: 30 g, Refills: 0    pantoprazole (PROTONIX) 40 MG tablet Take 40 mg by mouth every morning.     polyethylene glycol (MIRALAX / GLYCOLAX) packet Take 17 g by mouth daily as needed. For constipation    POTASSIUM GLUCONATE PO Take 99 mg by mouth every morning.     psyllium (METAMUCIL) 58.6 % powder Take 1 packet by mouth daily as needed (FOR CONSTIPATION).  QC LORATADINE ALLERGY RELIEF 10 MG tablet Take 10 mg by mouth daily. Refills: 4    RANEXA 500 MG 12 hr tablet TAKE ONE TABLET BY MOUTH TWICE DAILY. Qty: 60 tablet, Refills: 6    alum & mag hydroxide-simeth (MAALOX/MYLANTA) [id number] MG/5ML suspension Take 30 mLs by mouth every 6 (six) hours as needed for indigestion or heartburn (dyspepsia). Qty: 355 mL, Refills: 0    hydrocortisone (ANUSOL-HC) 25 MG suppository Place 1 suppository (25 mg total) rectally 2 (two) times daily. Qty: 24 suppository, Refills: 1    NON FORMULARY 1 suppository. Gentle Laxative 10 mg suppository   One  every other night       Allergies  Allergen Reactions  . Levaquin [Levofloxacin In D5w] Swelling  . Macrodantin [Nitrofurantoin Macrocrystal] Swelling  . Phenothiazines Anaphylaxis and Hives  . Polysorbate Anaphylaxis  . Prednisone Shortness Of Breath  . Buspirone Itching  . Cardura [Doxazosin Mesylate] Itching  . Codeine Itching  . Prochlorperazine Other (See Comments)    unknown  . Atorvastatin Hives    Cramping; tolerates Crestor ok  . Ofloxacin Rash  . Other Itching and Rash     "WOOL"= make skin look like it has been burned  . Penicillins Other (See Comments)    Causes redness all over.   . Pimozide Hives and Itching   Follow-up Information    Follow up with [facility], PA-C On 08/07/2014.   Specialty:  Physician Assistant   Why:  at 11:00 am   Contact information:   [place] [id number]       Follow up with [facility].   Contact information:   [address] [place] [address] [phone]        The results of significant diagnostics from this hospitalization (including imaging, microbiology, ancillary and laboratory) are listed below for reference.    Significant Diagnostic Studies: Dg Chest 2 View  07/29/2014   CLINICAL DATA:  Chest pain and shortness of Breath  EXAM: CHEST  2 VIEW  COMPARISON:  02/12/2014  FINDINGS: Heart size is normal. There is mild diffuse pulmonary edema, new from previous exam. Small bilateral pleural effusions identified. No airspace consolidation.  IMPRESSION: Mild congestive heart failure.   Electronically Signed   By: Kerby Moors M.D.   On: 07/29/2014 18:30   Ct Angio Chest Pe W/cm &/or Wo Cm  07/29/2014   CLINICAL DATA:  Worsening shortness of Breath and weakness today.  EXAM: CT ANGIOGRAPHY CHEST WITH CONTRAST  TECHNIQUE: Multidetector CT imaging of the chest was performed using the standard protocol during bolus administration of intravenous contrast. Multiplanar CT image reconstructions and MIPs were obtained to evaluate the vascular anatomy.  CONTRAST:  156mL OMNIPAQUE IOHEXOL 350 MG/ML SOLN  COMPARISON:  12/12/2013  FINDINGS: Chest wall: No breast masses, supraclavicular or axillary adenopathy. The thyroid gland is grossly normal. The bony thorax is intact.  Mediastinum: The heart is normal in size. No pericardial effusion. There arm borderline enlarged mediastinal and hilar lymph nodes. No mass is identified. The aorta is normal in caliber. Moderate atherosclerotic  calcifications. No dissection. Fairly extensive 3 vessel coronary artery calcifications. The esophagus is grossly normal.  The pulmonary arterial tree is fairly well opacified. No filling defects to suggest pulmonary emboli.  Lungs/ pleura: Patchy eat ground-glass opacity and small airspace nodules are noted along with peribronchial thickening and increased interstitial markings. There are also bilateral pleural effusions. Findings most likely represent pulmonary edema/CHF. No focal airspace consolidation.  No worrisome pulmonary lesions.  Upper abdomen: No significant findings. Advanced atherosclerotic calcifications noted involving the aorta and branch vessels.  Review of the MIP images confirms the above findings.  IMPRESSION: 1. No CT findings for pulmonary embolism. 2. Findings most suggestive of congestive heart failure with pulmonary edema and pleural effusions. Prominent mediastinal and hilar lymph nodes are likely a secondary finding. 3. Atherosclerotic changes involving the aorta and branch vessels but no dissection.   Electronically Signed   By: Marijo Sanes M.D.   On: 07/29/2014 21:52    Microbiology: Recent Results (from the past 240 hour(s))  Urine culture     Status: None (Preliminary result)   Collection Time: 07/29/14  7:20 PM  Result Value Ref Range Status   Specimen Description URINE, CLEAN CATCH  Final   Special Requests NONE  Final   Colony Count PENDING  Incomplete   Culture PENDING  Incomplete   Report Status PENDING  Incomplete     Labs: Basic Metabolic Panel:  Recent Labs Lab 07/29/14 1838 07/31/14 0716  NA 136 139  K 3.8 3.3*  CL 102 101  CO2 24 28  GLUCOSE 104* 109*  BUN 18 16  CREATININE 1.04 1.12*  CALCIUM 8.5 8.5   Liver Function Tests:  Recent Labs Lab 07/29/14 1838  AST 16  ALT 14  ALKPHOS 71  BILITOT 0.6  PROT 7.0  ALBUMIN 3.6   No results for input(s): LIPASE, AMYLASE in the last 168 hours. No results for input(s): AMMONIA in the last 168  hours. CBC:  Recent Labs Lab 07/29/14 1838 07/31/14 0716  WBC 7.1 6.3  NEUTROABS 4.1  --   HGB 8.5* 8.9*  HCT 25.8* 27.3*  MCV 81.4 81.7  PLT 297 278   Cardiac Enzymes:  Recent Labs Lab 07/30/14 0136 07/30/14 0741 07/30/14 1308  TROPONINI <0.03 <0.03 <0.03   BNP: BNP (last 3 results)  Recent Labs  07/29/14 1838  BNP 280.0*    ProBNP (last 3 results)  Recent Labs  11/24/13 1110  PROBNP 292.3    CBG: No results for input(s): GLUCAP in the last 168 hours.     SignedKelvin Cellar  [facility] 07/31/2014, 9:58 AM

## 2014-08-02 DIAGNOSIS — Z87891 Personal history of nicotine dependence: Secondary | ICD-10-CM | POA: Diagnosis not present

## 2014-08-02 DIAGNOSIS — Z5181 Encounter for therapeutic drug level monitoring: Secondary | ICD-10-CM | POA: Diagnosis not present

## 2014-08-02 DIAGNOSIS — I2510 Atherosclerotic heart disease of native coronary artery without angina pectoris: Secondary | ICD-10-CM | POA: Diagnosis not present

## 2014-08-02 DIAGNOSIS — I10 Essential (primary) hypertension: Secondary | ICD-10-CM | POA: Diagnosis not present

## 2014-08-02 DIAGNOSIS — K219 Gastro-esophageal reflux disease without esophagitis: Secondary | ICD-10-CM | POA: Diagnosis not present

## 2014-08-02 DIAGNOSIS — I739 Peripheral vascular disease, unspecified: Secondary | ICD-10-CM | POA: Diagnosis not present

## 2014-08-02 DIAGNOSIS — I5033 Acute on chronic diastolic (congestive) heart failure: Secondary | ICD-10-CM | POA: Diagnosis not present

## 2014-08-02 DIAGNOSIS — F329 Major depressive disorder, single episode, unspecified: Secondary | ICD-10-CM | POA: Diagnosis not present

## 2014-08-03 DIAGNOSIS — I739 Peripheral vascular disease, unspecified: Secondary | ICD-10-CM | POA: Diagnosis not present

## 2014-08-03 DIAGNOSIS — K219 Gastro-esophageal reflux disease without esophagitis: Secondary | ICD-10-CM | POA: Diagnosis not present

## 2014-08-03 DIAGNOSIS — F329 Major depressive disorder, single episode, unspecified: Secondary | ICD-10-CM | POA: Diagnosis not present

## 2014-08-03 DIAGNOSIS — I2510 Atherosclerotic heart disease of native coronary artery without angina pectoris: Secondary | ICD-10-CM | POA: Diagnosis not present

## 2014-08-03 DIAGNOSIS — I5033 Acute on chronic diastolic (congestive) heart failure: Secondary | ICD-10-CM | POA: Diagnosis not present

## 2014-08-03 DIAGNOSIS — I10 Essential (primary) hypertension: Secondary | ICD-10-CM | POA: Diagnosis not present

## 2014-08-04 DIAGNOSIS — I2510 Atherosclerotic heart disease of native coronary artery without angina pectoris: Secondary | ICD-10-CM | POA: Diagnosis not present

## 2014-08-04 DIAGNOSIS — I10 Essential (primary) hypertension: Secondary | ICD-10-CM | POA: Diagnosis not present

## 2014-08-04 DIAGNOSIS — I5033 Acute on chronic diastolic (congestive) heart failure: Secondary | ICD-10-CM | POA: Diagnosis not present

## 2014-08-04 DIAGNOSIS — F329 Major depressive disorder, single episode, unspecified: Secondary | ICD-10-CM | POA: Diagnosis not present

## 2014-08-04 DIAGNOSIS — K219 Gastro-esophageal reflux disease without esophagitis: Secondary | ICD-10-CM | POA: Diagnosis not present

## 2014-08-04 DIAGNOSIS — I739 Peripheral vascular disease, unspecified: Secondary | ICD-10-CM | POA: Diagnosis not present

## 2014-08-06 ENCOUNTER — Ambulatory Visit (INDEPENDENT_AMBULATORY_CARE_PROVIDER_SITE_OTHER): Payer: Medicare Other | Admitting: Gastroenterology

## 2014-08-06 ENCOUNTER — Encounter: Payer: Self-pay | Admitting: Gastroenterology

## 2014-08-06 VITALS — BP 127/75 | HR 72 | Temp 98.0°F | Ht 61.0 in | Wt 183.4 lb

## 2014-08-06 DIAGNOSIS — K5900 Constipation, unspecified: Secondary | ICD-10-CM | POA: Diagnosis not present

## 2014-08-06 DIAGNOSIS — I739 Peripheral vascular disease, unspecified: Secondary | ICD-10-CM | POA: Diagnosis not present

## 2014-08-06 DIAGNOSIS — F329 Major depressive disorder, single episode, unspecified: Secondary | ICD-10-CM | POA: Diagnosis not present

## 2014-08-06 DIAGNOSIS — I10 Essential (primary) hypertension: Secondary | ICD-10-CM | POA: Diagnosis not present

## 2014-08-06 DIAGNOSIS — D649 Anemia, unspecified: Secondary | ICD-10-CM | POA: Diagnosis not present

## 2014-08-06 DIAGNOSIS — K649 Unspecified hemorrhoids: Secondary | ICD-10-CM

## 2014-08-06 DIAGNOSIS — I5033 Acute on chronic diastolic (congestive) heart failure: Secondary | ICD-10-CM | POA: Diagnosis not present

## 2014-08-06 DIAGNOSIS — I2510 Atherosclerotic heart disease of native coronary artery without angina pectoris: Secondary | ICD-10-CM | POA: Diagnosis not present

## 2014-08-06 DIAGNOSIS — K219 Gastro-esophageal reflux disease without esophagitis: Secondary | ICD-10-CM | POA: Diagnosis not present

## 2014-08-06 MED ORDER — HYDROCORTISONE ACETATE 10 % RE FOAM: 1.0000 | Freq: Two times a day (BID) | RECTAL | Status: DC

## 2014-08-06 MED ORDER — LUBIPROSTONE 24 MCG PO CAPS: 24.0000 ug | ORAL_CAPSULE | Freq: Two times a day (BID) | ORAL | Status: DC

## 2014-08-06 NOTE — Patient Instructions (Addendum)
1. Proctocort Foam apply anorectally twice daily for 14 days. RX sent to your pharmacy. (if you notice shortness of breath related to this medication, please stop immediately, it is in the same class with prednisone but should not be absorbed. You tolerated the hydrocortisone cream and should do fine with this).  2. Amitiza 4mcg one capsule twice daily with food for constipation. Samples provided. RX sent to your pharmacy.  3. Call in 2-3 weeks and let me know how you are doing. If you still have lower abdominal discomfort, we will plan on CT scan of your abdomen. Call sooner if you have fever, worsening pain, etc. 4. Stop Miralax and Metamucil for now. They are likely contributing to your gas.

## 2014-08-06 NOTE — Progress Notes (Signed)
Primary Care Physician: [name]  Primary Gastroenterologist:  Garfield Cornea, MD   Chief Complaint  Patient presents with  . Hemorrhoids    HPI: [name] is a 81 y.o. female here for f/u. Last seen in 06/2014 for hemorrhoid swelling of one months duration associated with brbpr. No better with TUCKs and OTC hemorrhoids suppositories.   Still feels so swollen in rectum. Never used cream on the inside, only externally. Could not get the suppositories due to insurance. Stool caliber still thin, soft stool. Incomplete evacuation and no relief of constipation. Miralax/fiber QOD. C/o bloating discomfort. Passing a lot gas.   Low sodium diet. Had to cut back on fluids due to CHF. Just out of hospital due to exacerbation. Has chronic nausea which she feels is related to postnasal drip. Sees ENT soon.  Continues to be less mobile with recent multiple illnesses.  Recent worsening normocytic anemia noted during hospitalization.    Current Outpatient Prescriptions  Medication Sig Dispense Refill  . ALPRAZolam (XANAX) 0.25 MG tablet Take 0.25 mg by mouth daily as needed for anxiety.     [name] Kitchen alum & mag hydroxide-simeth (MAALOX/MYLANTA) 200-200-20 MG/5ML suspension Take 30 mLs by mouth every 6 (six) hours as needed for indigestion or heartburn (dyspepsia). 355 mL 0  . amLODipine (NORVASC) 10 MG tablet Take 1 tablet (10 mg total) by mouth daily. 30 tablet 6  . aspirin 81 MG EC tablet Take 81 mg by mouth every morning.     . carvedilol (COREG) 12.5 MG tablet TAKE ONE TABLET BY MOUTH TWICE DAILY. 60 tablet 3  . CRESTOR 5 MG tablet Take 5 mg by mouth every morning.     . DULoxetine (CYMBALTA) 60 MG capsule Take 60 mg by mouth at bedtime.     . fluticasone (VERAMYST) 27.5 MCG/SPRAY nasal spray Place 1 spray into the nose daily.    . furosemide (LASIX) 40 MG tablet Take 1 tablet (40 mg total) by mouth daily. 30 tablet 1  . hydrocortisone (PROCTOSOL HC) 2.5 % rectal cream Place 1  application rectally 2 (two) times daily. 30 g 0  . NON FORMULARY 1 suppository. Gentle Laxative 10 mg suppository   One  every other night    . pantoprazole (PROTONIX) 40 MG tablet Take 40 mg by mouth every morning.     . polyethylene glycol (MIRALAX / GLYCOLAX) packet Take 17 g by mouth daily as needed. For constipation    . potassium chloride (K-DUR) 10 MEQ tablet Take 2 tablets (20 mEq total) by mouth daily. 10 tablet 0  . POTASSIUM GLUCONATE PO Take 99 mg by mouth every morning.     . psyllium (METAMUCIL) 58.6 % powder Take 1 packet by mouth daily as needed (FOR CONSTIPATION).    . QC LORATADINE ALLERGY RELIEF 10 MG tablet Take 10 mg by mouth daily.  4  . RANEXA 500 MG 12 hr tablet TAKE ONE TABLET BY MOUTH TWICE DAILY. 60 tablet 6   No current facility-administered medications for this visit.    Allergies as of 08/06/2014 - Review Complete 08/06/2014  Allergen Reaction Noted  . Levaquin [levofloxacin in d5w] Swelling 07/06/2014  . Macrodantin [nitrofurantoin macrocrystal] Swelling 07/06/2014  . Phenothiazines Anaphylaxis and Hives 08/16/2011  . Polysorbate Anaphylaxis 08/16/2011  . Prednisone Shortness Of Breath   . Buspirone Itching 07/06/2014  . Cardura [doxazosin mesylate] Itching 07/06/2014  . Codeine Itching   . Prochlorperazine Other (See Comments) 08/16/2011  . Atorvastatin Hives 08/16/2011  .  Ofloxacin Rash   . Other Itching and Rash 10/10/2012  . Penicillins Other (See Comments) 11/09/2011  . Pimozide Hives and Itching 08/16/2011   Past Medical History  Diagnosis Date  . PVD (peripheral vascular disease)   . Other and unspecified hyperlipidemia   . Coronary atherosclerosis of native coronary artery     nonobstructive CAD by multiple catheterizations  . Panic disorder without agoraphobia   . HTN (hypertension)     Hx of it  . MI (myocardial infarction) 2006  . Internal hemorrhoids without mention of complication   . Dysphagia, unspecified(787.20)   . Microscopic  colitis 2003  . GERD (gastroesophageal reflux disease)     Hx Schatzki's ring, multiple EGD/ED last 01/06/2004  . Hyperlipemia   . Thyroid disease     recent abnl TSH per pt  . S/P colonoscopy 09/27/2001    internal hemorrhoids, desc colon inflam polyp, SB BX-chronic duodenitis, colitis  . Arthritis   . Shortness of breath   . Heart disease   . Bursitis     left shoulder  . Hyperlipidemia   . Sleep disorder     obstructive  . COPD (chronic obstructive pulmonary disease)   . Complication of anesthesia   . PONV (postoperative nausea and vomiting)     'a little nausea"  . Anxiety   . Depression   . Pneumonia 12/2011  . Heart murmur     'a littel'  . Cataract      Past Surgical History  Procedure Laterality Date  . Tonsillectomy    . Unspecified area, hysterectomy  1972    partial  . Cholecystectomy  1998  . Right knee replacement  2007  . Right leg benign tumor    . Breast lumpectomy  1998    left, benign  . Left hand surgery    . Left rotator cuff surgery    . Nasal sinus surgery    . Bladder tack  06/2010  . Maloney dilation  03/16/2011    Gastritis. No H.pylori on bx. 36F maloney dilation with disruption of  occult cevical esophageal web  . Colonoscopy  03/16/2011    multiple hyperplastic colon polyps, sigmoid diverticulosis, melanosis coli  . Bladder suspension  11/09/2011    Procedure: TRANSVAGINAL TAPE (TVT) PROCEDURE;  Surgeon: Marissa Nestle, MD;  Location: [facility];  Service: Urology;  Laterality: N/A;  . Abdominal hysterectomy    . Appendectomy    . Carpal tunnel release  1989    left  . Anterior and posterior repair      with resection of vagina  . Shoulder surgery Left   . Joint replacement Right 2007  . Lumbar laminectomy/decompression microdiscectomy N/A 10/11/2012    Procedure: LUMBAR LAMINECTOMY/DECOMPRESSION MICRODISCECTOMY 2 LEVELS;  Surgeon: Floyce Stakes, MD;  Location: [place] [facility];  Service: Neurosurgery;  Laterality: N/A;  L3-4 L4-5  Laminectomy  . Left heart catheterization with coronary angiogram N/A 05/14/2013    Procedure: LEFT HEART CATHETERIZATION WITH CORONARY ANGIOGRAM;  Surgeon: Blane Ohara, MD;  Location: [facility] CATH LAB;  Service: Cardiovascular;  Laterality: N/A;  . Cardiac catheterization    . Cardiac catheterization    . Coronary angioplasty with stent placement      ROS:  General: Negative for anorexia, weight loss, fever, chills, fatigue, weakness. ENT: Negative for hoarseness, difficulty swallowing , nasal congestion. CV: Negative for chest pain, angina, palpitations, dyspnea on exertion, peripheral edema.  Respiratory: Negative for dyspnea at rest, dyspnea on exertion, cough, sputum,  wheezing.  GI: See history of present illness. GU:  Negative for dysuria, hematuria, urinary incontinence, urinary frequency, nocturnal urination.  Endo: Negative for unusual weight change.    Physical Examination:   BP 127/75 mmHg  Pulse 72  Temp(Src) 98 F (36.7 C) (Oral)  Ht 5\' 1"  (1.549 m)  Wt 183 lb 6.4 oz (83.19 kg)  BMI 34.67 kg/m2  General: Well-nourished, well-developed in no acute distress.  Eyes: No icterus. Mouth: Oropharyngeal mucosa moist and pink , no lesions erythema or exudate. Lungs: Clear to auscultation bilaterally.  Heart: Regular rate and rhythm, no murmurs rubs or gallops.  Abdomen: Bowel sounds are normal, nontender, nondistended, no hepatosplenomegaly or masses, no abdominal bruits or hernia , no rebound or guarding.   Extremities: No lower extremity edema. No clubbing or deformities. Neuro: Alert and oriented x 4   Skin: Warm and dry, no jaundice.   Psych: Alert and cooperative, normal mood and affect.  Labs:  Reviewed labs Lab Results  Component Value Date   WBC 6.3 07/31/2014   HGB 8.9* 07/31/2014   HCT 27.3* 07/31/2014   MCV 81.7 07/31/2014   PLT 278 07/31/2014   Hgb was 10.7 04/2014  Imaging Studies: Dg Chest 2 View  07/29/2014   CLINICAL DATA:  Chest pain and  shortness of Breath  EXAM: CHEST  2 VIEW  COMPARISON:  02/12/2014  FINDINGS: Heart size is normal. There is mild diffuse pulmonary edema, new from previous exam. Small bilateral pleural effusions identified. No airspace consolidation.  IMPRESSION: Mild congestive heart failure.   Electronically Signed   By: Kerby Moors M.D.   On: 07/29/2014 18:30   Ct Angio Chest Pe W/cm &/or Wo Cm  07/29/2014   CLINICAL DATA:  Worsening shortness of Breath and weakness today.  EXAM: CT ANGIOGRAPHY CHEST WITH CONTRAST  TECHNIQUE: Multidetector CT imaging of the chest was performed using the standard protocol during bolus administration of intravenous contrast. Multiplanar CT image reconstructions and MIPs were obtained to evaluate the vascular anatomy.  CONTRAST:  132mL OMNIPAQUE IOHEXOL 350 MG/ML SOLN  COMPARISON:  12/12/2013  FINDINGS: Chest wall: No breast masses, supraclavicular or axillary adenopathy. The thyroid gland is grossly normal. The bony thorax is intact.  Mediastinum: The heart is normal in size. No pericardial effusion. There arm borderline enlarged mediastinal and hilar lymph nodes. No mass is identified. The aorta is normal in caliber. Moderate atherosclerotic calcifications. No dissection. Fairly extensive 3 vessel coronary artery calcifications. The esophagus is grossly normal.  The pulmonary arterial tree is fairly well opacified. No filling defects to suggest pulmonary emboli.  Lungs/ pleura: Patchy eat ground-glass opacity and small airspace nodules are noted along with peribronchial thickening and increased interstitial markings. There are also bilateral pleural effusions. Findings most likely represent pulmonary edema/CHF. No focal airspace consolidation. No worrisome pulmonary lesions.  Upper abdomen: No significant findings. Advanced atherosclerotic calcifications noted involving the aorta and branch vessels.  Review of the MIP images confirms the above findings.  IMPRESSION: 1. No CT findings for  pulmonary embolism. 2. Findings most suggestive of congestive heart failure with pulmonary edema and pleural effusions. Prominent mediastinal and hilar lymph nodes are likely a secondary finding. 3. Atherosclerotic changes involving the aorta and branch vessels but no dissection.   Electronically Signed   By: Marijo Sanes M.D.   On: 07/29/2014 21:52

## 2014-08-07 DIAGNOSIS — D649 Anemia, unspecified: Secondary | ICD-10-CM | POA: Insufficient documentation

## 2014-08-07 NOTE — Assessment & Plan Note (Signed)
>>  ASSESSMENT AND PLAN FOR HEMORRHOIDS WRITTEN ON 08/07/2014  1:46 PM BY LEWIS, LESLIE S, PA-C  Trial of Proctocort foam anorectally bid for 10 days. Call with PR in couple of weeks. Patient has tolerated hydrocortisone hemorrhoid creams before (allergy to prednisone).

## 2014-08-07 NOTE — Assessment & Plan Note (Signed)
Noted during recent admission. Will recheck at this time.

## 2014-08-07 NOTE — Assessment & Plan Note (Addendum)
Trial of Proctocort foam anorectally bid for 10 days. Call with PR in couple of weeks. Patient has tolerated hydrocortisone hemorrhoid creams before (allergy to prednisone).

## 2014-08-07 NOTE — Assessment & Plan Note (Signed)
Trial of Amitiza 13mcg BID with food. Stop Fiber and miralax which are likely contributing to her abdominal bloating/gas.

## 2014-08-10 ENCOUNTER — Telehealth: Payer: Self-pay

## 2014-08-10 DIAGNOSIS — R42 Dizziness and giddiness: Secondary | ICD-10-CM | POA: Diagnosis not present

## 2014-08-10 DIAGNOSIS — H9209 Otalgia, unspecified ear: Secondary | ICD-10-CM | POA: Diagnosis not present

## 2014-08-10 DIAGNOSIS — I10 Essential (primary) hypertension: Secondary | ICD-10-CM | POA: Diagnosis not present

## 2014-08-10 DIAGNOSIS — I2510 Atherosclerotic heart disease of native coronary artery without angina pectoris: Secondary | ICD-10-CM | POA: Diagnosis not present

## 2014-08-10 DIAGNOSIS — H905 Unspecified sensorineural hearing loss: Secondary | ICD-10-CM | POA: Diagnosis not present

## 2014-08-10 DIAGNOSIS — F329 Major depressive disorder, single episode, unspecified: Secondary | ICD-10-CM | POA: Diagnosis not present

## 2014-08-10 DIAGNOSIS — J3489 Other specified disorders of nose and nasal sinuses: Secondary | ICD-10-CM | POA: Diagnosis not present

## 2014-08-10 DIAGNOSIS — I739 Peripheral vascular disease, unspecified: Secondary | ICD-10-CM | POA: Diagnosis not present

## 2014-08-10 DIAGNOSIS — I5033 Acute on chronic diastolic (congestive) heart failure: Secondary | ICD-10-CM | POA: Diagnosis not present

## 2014-08-10 DIAGNOSIS — K219 Gastro-esophageal reflux disease without esophagitis: Secondary | ICD-10-CM | POA: Diagnosis not present

## 2014-08-10 NOTE — Telephone Encounter (Signed)
[facility] called- pt started her amitiza 47mcg on Friday. She has taken it everyday but had a "severe" case of diarrhea on Saturday. She hasnt had any more episodes of diarrhea since then but was wondering if it was too strong for her. I spoke with the pt, she said she only had diarrhea on Saturday, she has been doing well since then. No pain, no blood in the stool, no fever. She did not take the [place] on Sunday, but she took one today and  is going to take the [place] once a day for a couple of days and then go to bid and see how she does. She is going to call me before she runs out of samples and let me know how she is doing. She is going to let me know before she runs out of samples. She said she is eating well and went out walking today and said she feels pretty good right now.  I told her that I would let LSL know.   She is also aware that she needs blood work and [facility] is going to draw that for her. Lab orders have been faxed to the [facility] office.

## 2014-08-10 NOTE — Progress Notes (Signed)
cc'ed to pcp °

## 2014-08-11 DIAGNOSIS — K219 Gastro-esophageal reflux disease without esophagitis: Secondary | ICD-10-CM | POA: Diagnosis not present

## 2014-08-11 DIAGNOSIS — D649 Anemia, unspecified: Secondary | ICD-10-CM | POA: Diagnosis not present

## 2014-08-11 DIAGNOSIS — I5033 Acute on chronic diastolic (congestive) heart failure: Secondary | ICD-10-CM | POA: Diagnosis not present

## 2014-08-11 DIAGNOSIS — I10 Essential (primary) hypertension: Secondary | ICD-10-CM | POA: Diagnosis not present

## 2014-08-11 DIAGNOSIS — I739 Peripheral vascular disease, unspecified: Secondary | ICD-10-CM | POA: Diagnosis not present

## 2014-08-11 DIAGNOSIS — F329 Major depressive disorder, single episode, unspecified: Secondary | ICD-10-CM | POA: Diagnosis not present

## 2014-08-11 DIAGNOSIS — I2510 Atherosclerotic heart disease of native coronary artery without angina pectoris: Secondary | ICD-10-CM | POA: Diagnosis not present

## 2014-08-11 NOTE — Telephone Encounter (Signed)
Noted. Agree with plan as outlined.

## 2014-08-12 ENCOUNTER — Telehealth: Payer: Self-pay | Admitting: *Deleted

## 2014-08-12 DIAGNOSIS — R635 Abnormal weight gain: Secondary | ICD-10-CM | POA: Insufficient documentation

## 2014-08-12 MED ORDER — FUROSEMIDE 40 MG PO TABS: ORAL_TABLET | ORAL | Status: DC

## 2014-08-12 MED ORDER — POTASSIUM CHLORIDE ER 10 MEQ PO TBCR: EXTENDED_RELEASE_TABLET | ORAL | Status: DC

## 2014-08-12 NOTE — Telephone Encounter (Signed)
To be safe, increase Lasix to 40 mg bid x 3 days and increase KCl to 40 meq daily x 3 days. Then go back to her regular dose. Check BMET on Monday.

## 2014-08-12 NOTE — Telephone Encounter (Signed)
Home Health nurse called to report pt weight changing X 3 days - 08/10/14 178lbs 08/11/14 182lbs 08/12/14 184lbs. Phoned pt who says she is not swelling no SOB or CP. Pt thinks this is related to eating late at night. Will forward to Dr. Bronson Ing

## 2014-08-12 NOTE — Telephone Encounter (Signed)
Angie from Home health made aware. Will draw BMP on Monday. Sent medication instructions to pharmacy and Angie will inform pt.

## 2014-08-13 ENCOUNTER — Other Ambulatory Visit: Payer: Self-pay | Admitting: Cardiovascular Disease

## 2014-08-13 ENCOUNTER — Telehealth: Payer: Self-pay | Admitting: Cardiovascular Disease

## 2014-08-13 DIAGNOSIS — I10 Essential (primary) hypertension: Secondary | ICD-10-CM | POA: Diagnosis not present

## 2014-08-13 DIAGNOSIS — I739 Peripheral vascular disease, unspecified: Secondary | ICD-10-CM | POA: Diagnosis not present

## 2014-08-13 DIAGNOSIS — I5033 Acute on chronic diastolic (congestive) heart failure: Secondary | ICD-10-CM | POA: Diagnosis not present

## 2014-08-13 DIAGNOSIS — K219 Gastro-esophageal reflux disease without esophagitis: Secondary | ICD-10-CM | POA: Diagnosis not present

## 2014-08-13 DIAGNOSIS — F329 Major depressive disorder, single episode, unspecified: Secondary | ICD-10-CM | POA: Diagnosis not present

## 2014-08-13 DIAGNOSIS — I2510 Atherosclerotic heart disease of native coronary artery without angina pectoris: Secondary | ICD-10-CM | POA: Diagnosis not present

## 2014-08-13 NOTE — Telephone Encounter (Signed)
[facility] has questions in regards to RX of Potassium. / tg

## 2014-08-13 NOTE — Telephone Encounter (Signed)
New order for increase in Lasix and Potassium. Weight today is 179.2. Does patient need to keep up with increased dose. / tg

## 2014-08-14 DIAGNOSIS — F329 Major depressive disorder, single episode, unspecified: Secondary | ICD-10-CM | POA: Diagnosis not present

## 2014-08-14 DIAGNOSIS — K219 Gastro-esophageal reflux disease without esophagitis: Secondary | ICD-10-CM | POA: Diagnosis not present

## 2014-08-14 DIAGNOSIS — I5033 Acute on chronic diastolic (congestive) heart failure: Secondary | ICD-10-CM | POA: Diagnosis not present

## 2014-08-14 DIAGNOSIS — I10 Essential (primary) hypertension: Secondary | ICD-10-CM | POA: Diagnosis not present

## 2014-08-14 DIAGNOSIS — I739 Peripheral vascular disease, unspecified: Secondary | ICD-10-CM | POA: Diagnosis not present

## 2014-08-14 DIAGNOSIS — I2510 Atherosclerotic heart disease of native coronary artery without angina pectoris: Secondary | ICD-10-CM | POA: Diagnosis not present

## 2014-08-17 ENCOUNTER — Encounter: Payer: Self-pay | Admitting: Gastroenterology

## 2014-08-17 ENCOUNTER — Telehealth: Payer: Self-pay | Admitting: Gastroenterology

## 2014-08-17 ENCOUNTER — Other Ambulatory Visit: Payer: Self-pay | Admitting: Gastroenterology

## 2014-08-17 DIAGNOSIS — F329 Major depressive disorder, single episode, unspecified: Secondary | ICD-10-CM | POA: Diagnosis not present

## 2014-08-17 DIAGNOSIS — I739 Peripheral vascular disease, unspecified: Secondary | ICD-10-CM | POA: Diagnosis not present

## 2014-08-17 DIAGNOSIS — I5033 Acute on chronic diastolic (congestive) heart failure: Secondary | ICD-10-CM | POA: Diagnosis not present

## 2014-08-17 DIAGNOSIS — I10 Essential (primary) hypertension: Secondary | ICD-10-CM | POA: Diagnosis not present

## 2014-08-17 DIAGNOSIS — K219 Gastro-esophageal reflux disease without esophagitis: Secondary | ICD-10-CM | POA: Diagnosis not present

## 2014-08-17 DIAGNOSIS — I2510 Atherosclerotic heart disease of native coronary artery without angina pectoris: Secondary | ICD-10-CM | POA: Diagnosis not present

## 2014-08-17 DIAGNOSIS — D649 Anemia, unspecified: Secondary | ICD-10-CM

## 2014-08-17 NOTE — Telephone Encounter (Signed)
Spoke with [facility]. Patient is unsure what meds she is to take.  Patient to Call [facility] when she returns home to review meds.

## 2014-08-17 NOTE — Telephone Encounter (Signed)
  Received labs back from [facility].  To be scanned into the system.  White blood cell count 5700, hemoglobin 9.6, hematocrit 30.4, MCV 78, platelets 385,000. Iron 35, TIBC 416, iron saturations 8% low, ferritin 22, low normal.  Review of Epic shows hemoglobin in the 11-12 range in 2015. 04/28/2014 her hemoglobin was 10.7. 07/29/2014 her hemoglobin was 8.5. 07/31/2014 her hemoglobin was 8.9.  No previous iron and ferritin to compare to.  Please let patient know that her hemoglobin is slightly improved but her labs indicate a trend towards iron deficiency anemia.   1. Please collect I FOBT. 2. Repeat CBC, iron/TIBC, ferritin in 3 months. 3. Recommend ferrous sulfate 325 mg daily, OTC.

## 2014-08-17 NOTE — Progress Notes (Deleted)
Patient ID: [name], female   DOB: [birth year], 81 y.o.   MRN: [id number]  Received labs back from [facility].  To be scanned into the system.  White blood cell count 5700, hemoglobin 9.6, hematocrit 30.4, MCV 78, platelets 385,000. Iron 35, TIBC 416, iron saturations 8% low, ferritin 22, low normal.  Review of Epic shows hemoglobin in the 11-12 range in 2015. 04/28/2014 her hemoglobin was 10.7. 07/29/2014 her hemoglobin was 8.5. 07/31/2014 her hemoglobin was 8.9.  No previous iron and ferritin to compare to.  Please let patient know that her hemoglobin is slightly improved but her labs indicate a trend towards iron deficiency anemia.   1. Please collect I FOBT. 2. Repeat CBC, iron/TIBC, ferritin in 3 months. 3. Recommend ferrous sulfate 325 mg daily, OTC.

## 2014-08-17 NOTE — Telephone Encounter (Signed)
Pt is aware. She is going to the drug store today and will get some iron. IFOBT is in the mail to the pt. Lab orders on file.

## 2014-08-17 NOTE — Progress Notes (Signed)
  Received labs back from [facility].  To be scanned into the system.  White blood cell count 5700, hemoglobin 9.6, hematocrit 30.4, MCV 78, platelets 385,000. Iron 35, TIBC 416, iron saturations 8% low, ferritin 22, low normal.  Review of Epic shows hemoglobin in the 11-12 range in 2015. 04/28/2014 her hemoglobin was 10.7. 07/29/2014 her hemoglobin was 8.5. 07/31/2014 her hemoglobin was 8.9.  No previous iron and ferritin to compare to.  Please let patient know that her hemoglobin is slightly improved but her labs indicate a trend towards iron deficiency anemia.   1. Please collect I FOBT. 2. Repeat CBC, iron/TIBC, ferritin in 3 months. 3. Recommend ferrous sulfate 325 mg daily, OTC.

## 2014-08-18 DIAGNOSIS — I10 Essential (primary) hypertension: Secondary | ICD-10-CM | POA: Diagnosis not present

## 2014-08-18 DIAGNOSIS — J449 Chronic obstructive pulmonary disease, unspecified: Secondary | ICD-10-CM | POA: Diagnosis not present

## 2014-08-18 DIAGNOSIS — K210 Gastro-esophageal reflux disease with esophagitis: Secondary | ICD-10-CM | POA: Diagnosis not present

## 2014-08-18 DIAGNOSIS — I5030 Unspecified diastolic (congestive) heart failure: Secondary | ICD-10-CM | POA: Diagnosis not present

## 2014-08-18 NOTE — Telephone Encounter (Signed)
Spoke with patient and daughter. Patient is taking Potassium as written. All questions answered.

## 2014-08-19 ENCOUNTER — Encounter: Payer: Self-pay | Admitting: Cardiovascular Disease

## 2014-08-19 DIAGNOSIS — I5033 Acute on chronic diastolic (congestive) heart failure: Secondary | ICD-10-CM | POA: Diagnosis not present

## 2014-08-19 DIAGNOSIS — I10 Essential (primary) hypertension: Secondary | ICD-10-CM | POA: Diagnosis not present

## 2014-08-19 DIAGNOSIS — F329 Major depressive disorder, single episode, unspecified: Secondary | ICD-10-CM | POA: Diagnosis not present

## 2014-08-19 DIAGNOSIS — K219 Gastro-esophageal reflux disease without esophagitis: Secondary | ICD-10-CM | POA: Diagnosis not present

## 2014-08-19 DIAGNOSIS — I2510 Atherosclerotic heart disease of native coronary artery without angina pectoris: Secondary | ICD-10-CM | POA: Diagnosis not present

## 2014-08-19 DIAGNOSIS — I739 Peripheral vascular disease, unspecified: Secondary | ICD-10-CM | POA: Diagnosis not present

## 2014-08-24 DIAGNOSIS — I10 Essential (primary) hypertension: Secondary | ICD-10-CM | POA: Diagnosis not present

## 2014-08-24 DIAGNOSIS — K219 Gastro-esophageal reflux disease without esophagitis: Secondary | ICD-10-CM | POA: Diagnosis not present

## 2014-08-24 DIAGNOSIS — F329 Major depressive disorder, single episode, unspecified: Secondary | ICD-10-CM | POA: Diagnosis not present

## 2014-08-24 DIAGNOSIS — I2510 Atherosclerotic heart disease of native coronary artery without angina pectoris: Secondary | ICD-10-CM | POA: Diagnosis not present

## 2014-08-24 DIAGNOSIS — I5033 Acute on chronic diastolic (congestive) heart failure: Secondary | ICD-10-CM | POA: Diagnosis not present

## 2014-08-24 DIAGNOSIS — I739 Peripheral vascular disease, unspecified: Secondary | ICD-10-CM | POA: Diagnosis not present

## 2014-08-26 DIAGNOSIS — F329 Major depressive disorder, single episode, unspecified: Secondary | ICD-10-CM | POA: Diagnosis not present

## 2014-08-26 DIAGNOSIS — K219 Gastro-esophageal reflux disease without esophagitis: Secondary | ICD-10-CM | POA: Diagnosis not present

## 2014-08-26 DIAGNOSIS — I2510 Atherosclerotic heart disease of native coronary artery without angina pectoris: Secondary | ICD-10-CM | POA: Diagnosis not present

## 2014-08-26 DIAGNOSIS — I10 Essential (primary) hypertension: Secondary | ICD-10-CM | POA: Diagnosis not present

## 2014-08-26 DIAGNOSIS — I5033 Acute on chronic diastolic (congestive) heart failure: Secondary | ICD-10-CM | POA: Diagnosis not present

## 2014-08-26 DIAGNOSIS — I739 Peripheral vascular disease, unspecified: Secondary | ICD-10-CM | POA: Diagnosis not present

## 2014-08-30 DIAGNOSIS — I10 Essential (primary) hypertension: Secondary | ICD-10-CM | POA: Diagnosis not present

## 2014-08-30 DIAGNOSIS — I2510 Atherosclerotic heart disease of native coronary artery without angina pectoris: Secondary | ICD-10-CM | POA: Diagnosis not present

## 2014-08-30 DIAGNOSIS — F419 Anxiety disorder, unspecified: Secondary | ICD-10-CM | POA: Diagnosis not present

## 2014-08-30 DIAGNOSIS — I5030 Unspecified diastolic (congestive) heart failure: Secondary | ICD-10-CM | POA: Diagnosis not present

## 2014-09-01 DIAGNOSIS — I5033 Acute on chronic diastolic (congestive) heart failure: Secondary | ICD-10-CM | POA: Diagnosis not present

## 2014-09-01 DIAGNOSIS — I2510 Atherosclerotic heart disease of native coronary artery without angina pectoris: Secondary | ICD-10-CM | POA: Diagnosis not present

## 2014-09-01 DIAGNOSIS — I10 Essential (primary) hypertension: Secondary | ICD-10-CM | POA: Diagnosis not present

## 2014-09-01 DIAGNOSIS — K219 Gastro-esophageal reflux disease without esophagitis: Secondary | ICD-10-CM | POA: Diagnosis not present

## 2014-09-01 DIAGNOSIS — I739 Peripheral vascular disease, unspecified: Secondary | ICD-10-CM | POA: Diagnosis not present

## 2014-09-01 DIAGNOSIS — F329 Major depressive disorder, single episode, unspecified: Secondary | ICD-10-CM | POA: Diagnosis not present

## 2014-09-03 DIAGNOSIS — I10 Essential (primary) hypertension: Secondary | ICD-10-CM | POA: Diagnosis not present

## 2014-09-03 DIAGNOSIS — F329 Major depressive disorder, single episode, unspecified: Secondary | ICD-10-CM | POA: Diagnosis not present

## 2014-09-03 DIAGNOSIS — I2510 Atherosclerotic heart disease of native coronary artery without angina pectoris: Secondary | ICD-10-CM | POA: Diagnosis not present

## 2014-09-03 DIAGNOSIS — I5033 Acute on chronic diastolic (congestive) heart failure: Secondary | ICD-10-CM | POA: Diagnosis not present

## 2014-09-03 DIAGNOSIS — K219 Gastro-esophageal reflux disease without esophagitis: Secondary | ICD-10-CM | POA: Diagnosis not present

## 2014-09-03 DIAGNOSIS — I739 Peripheral vascular disease, unspecified: Secondary | ICD-10-CM | POA: Diagnosis not present

## 2014-09-04 DIAGNOSIS — I739 Peripheral vascular disease, unspecified: Secondary | ICD-10-CM | POA: Diagnosis not present

## 2014-09-04 DIAGNOSIS — F329 Major depressive disorder, single episode, unspecified: Secondary | ICD-10-CM | POA: Diagnosis not present

## 2014-09-04 DIAGNOSIS — I2510 Atherosclerotic heart disease of native coronary artery without angina pectoris: Secondary | ICD-10-CM | POA: Diagnosis not present

## 2014-09-04 DIAGNOSIS — I10 Essential (primary) hypertension: Secondary | ICD-10-CM | POA: Diagnosis not present

## 2014-09-04 DIAGNOSIS — K219 Gastro-esophageal reflux disease without esophagitis: Secondary | ICD-10-CM | POA: Diagnosis not present

## 2014-09-04 DIAGNOSIS — I5033 Acute on chronic diastolic (congestive) heart failure: Secondary | ICD-10-CM | POA: Diagnosis not present

## 2014-09-07 ENCOUNTER — Telehealth: Payer: Self-pay | Admitting: *Deleted

## 2014-09-07 DIAGNOSIS — I5033 Acute on chronic diastolic (congestive) heart failure: Secondary | ICD-10-CM | POA: Diagnosis not present

## 2014-09-07 DIAGNOSIS — I739 Peripheral vascular disease, unspecified: Secondary | ICD-10-CM | POA: Diagnosis not present

## 2014-09-07 DIAGNOSIS — I10 Essential (primary) hypertension: Secondary | ICD-10-CM | POA: Diagnosis not present

## 2014-09-07 DIAGNOSIS — F329 Major depressive disorder, single episode, unspecified: Secondary | ICD-10-CM | POA: Diagnosis not present

## 2014-09-07 DIAGNOSIS — I2510 Atherosclerotic heart disease of native coronary artery without angina pectoris: Secondary | ICD-10-CM | POA: Diagnosis not present

## 2014-09-07 DIAGNOSIS — K219 Gastro-esophageal reflux disease without esophagitis: Secondary | ICD-10-CM | POA: Diagnosis not present

## 2014-09-07 NOTE — Telephone Encounter (Signed)
[facility] with [facility] calling from patient's home - stating patient is c/o lower sternal pain, left arm/shoulder pain, pain going down left arm with numbness.  Also, c/o productive cough.  States she has had these symptoms for several months, but occuring more frequently.  Stated that she does have bursitis so that could be contributing factor with arm.  Stated that she did not want to go to ED.  Informed Angie that I would suggest she go to ED or PMD today in light of all these varying symptoms & her history.  Angie verbalized understanding.

## 2014-09-08 DIAGNOSIS — I10 Essential (primary) hypertension: Secondary | ICD-10-CM | POA: Diagnosis not present

## 2014-09-08 DIAGNOSIS — I2510 Atherosclerotic heart disease of native coronary artery without angina pectoris: Secondary | ICD-10-CM | POA: Diagnosis not present

## 2014-09-08 DIAGNOSIS — K219 Gastro-esophageal reflux disease without esophagitis: Secondary | ICD-10-CM | POA: Diagnosis not present

## 2014-09-08 DIAGNOSIS — F329 Major depressive disorder, single episode, unspecified: Secondary | ICD-10-CM | POA: Diagnosis not present

## 2014-09-08 DIAGNOSIS — I5033 Acute on chronic diastolic (congestive) heart failure: Secondary | ICD-10-CM | POA: Diagnosis not present

## 2014-09-08 DIAGNOSIS — I739 Peripheral vascular disease, unspecified: Secondary | ICD-10-CM | POA: Diagnosis not present

## 2014-09-09 ENCOUNTER — Other Ambulatory Visit (HOSPITAL_COMMUNITY): Payer: Self-pay | Admitting: Radiology

## 2014-09-09 DIAGNOSIS — J449 Chronic obstructive pulmonary disease, unspecified: Secondary | ICD-10-CM

## 2014-09-11 ENCOUNTER — Ambulatory Visit (HOSPITAL_COMMUNITY)
Admission: RE | Admit: 2014-09-11 | Discharge: 2014-09-11 | Disposition: A | Discharge status: Home / Self Care | Payer: Medicare Other | Source: Ambulatory Visit | Attending: Pulmonary Disease | Admitting: Pulmonary Disease

## 2014-09-11 DIAGNOSIS — K219 Gastro-esophageal reflux disease without esophagitis: Secondary | ICD-10-CM | POA: Diagnosis not present

## 2014-09-11 DIAGNOSIS — J449 Chronic obstructive pulmonary disease, unspecified: Secondary | ICD-10-CM | POA: Insufficient documentation

## 2014-09-11 DIAGNOSIS — I10 Essential (primary) hypertension: Secondary | ICD-10-CM | POA: Diagnosis not present

## 2014-09-11 DIAGNOSIS — E782 Mixed hyperlipidemia: Secondary | ICD-10-CM | POA: Diagnosis not present

## 2014-09-11 MED ORDER — ALBUTEROL SULFATE (2.5 MG/3ML) 0.083% IN NEBU
2.5000 mg | INHALATION_SOLUTION | Freq: Once | RESPIRATORY_TRACT | Status: AC
  Administered 2014-09-11: 2.5 mg via RESPIRATORY_TRACT

## 2014-09-14 LAB — PULMONARY FUNCTION TEST
DL/VA % pred: 73 %
DL/VA: 3.24 ml/min/mmHg/L
DLCO UNC: 10.57 ml/min/mmHg
DLCO unc % pred: 52 %
FEF 25-75 Post: 1.93 L/sec
FEF 25-75 Pre: 2.01 L/sec
FEF2575-%Change-Post: -4 %
FEF2575-%Pred-Post: 154 %
FEF2575-%Pred-Pre: 161 %
FEV1-%Change-Post: 0 %
FEV1-%PRED-POST: 94 %
FEV1-%PRED-PRE: 94 %
FEV1-PRE: 1.58 L
FEV1-Post: 1.58 L
FEV1FVC-%CHANGE-POST: -1 %
FEV1FVC-%Pred-Pre: 115 %
FEV6-%Change-Post: 0 %
FEV6-%PRED-POST: 88 %
FEV6-%Pred-Pre: 87 %
FEV6-POST: 1.87 L
FEV6-Pre: 1.86 L
FEV6FVC-%PRED-PRE: 106 %
FEV6FVC-%Pred-Post: 106 %
FVC-%CHANGE-POST: 0 %
FVC-%PRED-POST: 82 %
FVC-%Pred-Pre: 82 %
FVC-Post: 1.87 L
FVC-Pre: 1.86 L
PRE FEV6/FVC RATIO: 100 %
Post FEV1/FVC ratio: 84 %
Post FEV6/FVC ratio: 100 %
Pre FEV1/FVC ratio: 85 %
RV % pred: 89 %
RV: 2.01 L
TLC % PRED: 83 %
TLC: 3.85 L

## 2014-09-16 DIAGNOSIS — I5033 Acute on chronic diastolic (congestive) heart failure: Secondary | ICD-10-CM | POA: Diagnosis not present

## 2014-09-16 DIAGNOSIS — I2510 Atherosclerotic heart disease of native coronary artery without angina pectoris: Secondary | ICD-10-CM | POA: Diagnosis not present

## 2014-09-16 DIAGNOSIS — I10 Essential (primary) hypertension: Secondary | ICD-10-CM | POA: Diagnosis not present

## 2014-09-16 DIAGNOSIS — F329 Major depressive disorder, single episode, unspecified: Secondary | ICD-10-CM | POA: Diagnosis not present

## 2014-09-16 DIAGNOSIS — I739 Peripheral vascular disease, unspecified: Secondary | ICD-10-CM | POA: Diagnosis not present

## 2014-09-16 DIAGNOSIS — K219 Gastro-esophageal reflux disease without esophagitis: Secondary | ICD-10-CM | POA: Diagnosis not present

## 2014-09-17 DIAGNOSIS — I739 Peripheral vascular disease, unspecified: Secondary | ICD-10-CM | POA: Diagnosis not present

## 2014-09-17 DIAGNOSIS — R739 Hyperglycemia, unspecified: Secondary | ICD-10-CM | POA: Diagnosis not present

## 2014-09-17 DIAGNOSIS — I5030 Unspecified diastolic (congestive) heart failure: Secondary | ICD-10-CM | POA: Diagnosis not present

## 2014-09-17 DIAGNOSIS — F419 Anxiety disorder, unspecified: Secondary | ICD-10-CM | POA: Diagnosis not present

## 2014-09-17 DIAGNOSIS — I10 Essential (primary) hypertension: Secondary | ICD-10-CM | POA: Diagnosis not present

## 2014-09-17 DIAGNOSIS — J019 Acute sinusitis, unspecified: Secondary | ICD-10-CM | POA: Diagnosis not present

## 2014-09-17 DIAGNOSIS — I2510 Atherosclerotic heart disease of native coronary artery without angina pectoris: Secondary | ICD-10-CM | POA: Diagnosis not present

## 2014-09-21 DIAGNOSIS — I10 Essential (primary) hypertension: Secondary | ICD-10-CM | POA: Diagnosis not present

## 2014-09-21 DIAGNOSIS — I739 Peripheral vascular disease, unspecified: Secondary | ICD-10-CM | POA: Diagnosis not present

## 2014-09-21 DIAGNOSIS — I5033 Acute on chronic diastolic (congestive) heart failure: Secondary | ICD-10-CM | POA: Diagnosis not present

## 2014-09-21 DIAGNOSIS — F329 Major depressive disorder, single episode, unspecified: Secondary | ICD-10-CM | POA: Diagnosis not present

## 2014-09-21 DIAGNOSIS — I2510 Atherosclerotic heart disease of native coronary artery without angina pectoris: Secondary | ICD-10-CM | POA: Diagnosis not present

## 2014-09-21 DIAGNOSIS — K219 Gastro-esophageal reflux disease without esophagitis: Secondary | ICD-10-CM | POA: Diagnosis not present

## 2014-09-22 ENCOUNTER — Other Ambulatory Visit: Payer: Self-pay

## 2014-09-22 DIAGNOSIS — R197 Diarrhea, unspecified: Secondary | ICD-10-CM

## 2014-09-22 DIAGNOSIS — R195 Other fecal abnormalities: Secondary | ICD-10-CM | POA: Diagnosis not present

## 2014-09-24 LAB — GASTROINTESTINAL PATHOGEN PANEL PCR
C. difficile Tox A/B, PCR: NEGATIVE
CAMPYLOBACTER, PCR: NEGATIVE
Cryptosporidium, PCR: NEGATIVE
E COLI (ETEC) LT/ST, PCR: NEGATIVE
E COLI (STEC) STX1/STX2, PCR: NEGATIVE
E coli 0157, PCR: NEGATIVE
Giardia lamblia, PCR: NEGATIVE
NOROVIRUS, PCR: NEGATIVE
Rotavirus A, PCR: NEGATIVE
SALMONELLA, PCR: NEGATIVE
Shigella, PCR: NEGATIVE

## 2014-09-25 ENCOUNTER — Telehealth: Payer: Self-pay

## 2014-09-25 DIAGNOSIS — I771 Stricture of artery: Secondary | ICD-10-CM | POA: Diagnosis not present

## 2014-09-25 DIAGNOSIS — I739 Peripheral vascular disease, unspecified: Secondary | ICD-10-CM | POA: Diagnosis not present

## 2014-09-25 DIAGNOSIS — I743 Embolism and thrombosis of arteries of the lower extremities: Secondary | ICD-10-CM | POA: Diagnosis not present

## 2014-09-25 DIAGNOSIS — M79606 Pain in leg, unspecified: Secondary | ICD-10-CM | POA: Diagnosis not present

## 2014-09-25 LAB — ABI

## 2014-09-25 NOTE — Telephone Encounter (Signed)
I spoke with the pt- she is doing well at this time. Her only complaint is her hemorrhoids. She said the stool was soft but her hemorrhoids still swell up on occasion and she has to use the rectal cream that we sent in for her. She is also using tucks wipes. She has not seen any blood. She still has some of the rectal cream left but doesn't have any refills and wanted to know if we could send in some refills in case she needed it. Pt uses [facility].

## 2014-09-27 MED ORDER — HYDROCORTISONE ACETATE 10 % RE FOAM: 1.0000 | Freq: Two times a day (BID) | RECTAL | Status: DC

## 2014-09-27 NOTE — Telephone Encounter (Signed)
RX done. 

## 2014-09-28 ENCOUNTER — Telehealth: Payer: Self-pay

## 2014-09-28 MED ORDER — HYDROCORTISONE 2.5 % RE CREA: 1.0000 "application " | TOPICAL_CREAM | Freq: Two times a day (BID) | RECTAL | Status: DC

## 2014-09-28 NOTE — Telephone Encounter (Signed)
Pt is aware.  

## 2014-09-28 NOTE — Telephone Encounter (Signed)
OK. New rx sent.

## 2014-09-28 NOTE — Telephone Encounter (Signed)
T/C from [place] at [facility].  Hydrocortisone is not covered by her insurance. Pt has had Protosol AC cream before and her insurance covered. Please advise!

## 2014-10-21 DIAGNOSIS — K219 Gastro-esophageal reflux disease without esophagitis: Secondary | ICD-10-CM | POA: Diagnosis not present

## 2014-10-21 DIAGNOSIS — R739 Hyperglycemia, unspecified: Secondary | ICD-10-CM | POA: Diagnosis not present

## 2014-10-21 DIAGNOSIS — I10 Essential (primary) hypertension: Secondary | ICD-10-CM | POA: Diagnosis not present

## 2014-10-21 DIAGNOSIS — E782 Mixed hyperlipidemia: Secondary | ICD-10-CM | POA: Diagnosis not present

## 2014-11-01 ENCOUNTER — Encounter (HOSPITAL_COMMUNITY): Payer: Self-pay | Admitting: *Deleted

## 2014-11-01 ENCOUNTER — Observation Stay (HOSPITAL_COMMUNITY)
Admission: EM | Admit: 2014-11-01 | Discharge: 2014-11-02 | Disposition: A | Discharge status: Home / Self Care | Payer: Medicare Other | Attending: Internal Medicine | Admitting: Internal Medicine

## 2014-11-01 ENCOUNTER — Emergency Department (HOSPITAL_COMMUNITY): Payer: Medicare Other

## 2014-11-01 DIAGNOSIS — Z880 Allergy status to penicillin: Secondary | ICD-10-CM | POA: Diagnosis not present

## 2014-11-01 DIAGNOSIS — M719 Bursopathy, unspecified: Secondary | ICD-10-CM | POA: Diagnosis not present

## 2014-11-01 DIAGNOSIS — Z7982 Long term (current) use of aspirin: Secondary | ICD-10-CM | POA: Diagnosis not present

## 2014-11-01 DIAGNOSIS — R071 Chest pain on breathing: Secondary | ICD-10-CM | POA: Diagnosis not present

## 2014-11-01 DIAGNOSIS — G479 Sleep disorder, unspecified: Secondary | ICD-10-CM | POA: Insufficient documentation

## 2014-11-01 DIAGNOSIS — K219 Gastro-esophageal reflux disease without esophagitis: Secondary | ICD-10-CM | POA: Diagnosis not present

## 2014-11-01 DIAGNOSIS — Z8679 Personal history of other diseases of the circulatory system: Secondary | ICD-10-CM

## 2014-11-01 DIAGNOSIS — Z8701 Personal history of pneumonia (recurrent): Secondary | ICD-10-CM | POA: Insufficient documentation

## 2014-11-01 DIAGNOSIS — I519 Heart disease, unspecified: Secondary | ICD-10-CM | POA: Insufficient documentation

## 2014-11-01 DIAGNOSIS — J449 Chronic obstructive pulmonary disease, unspecified: Secondary | ICD-10-CM | POA: Diagnosis not present

## 2014-11-01 DIAGNOSIS — E785 Hyperlipidemia, unspecified: Secondary | ICD-10-CM | POA: Insufficient documentation

## 2014-11-01 DIAGNOSIS — I10 Essential (primary) hypertension: Secondary | ICD-10-CM | POA: Insufficient documentation

## 2014-11-01 DIAGNOSIS — F419 Anxiety disorder, unspecified: Secondary | ICD-10-CM | POA: Insufficient documentation

## 2014-11-01 DIAGNOSIS — Z79899 Other long term (current) drug therapy: Secondary | ICD-10-CM | POA: Insufficient documentation

## 2014-11-01 DIAGNOSIS — I739 Peripheral vascular disease, unspecified: Secondary | ICD-10-CM | POA: Insufficient documentation

## 2014-11-01 DIAGNOSIS — F329 Major depressive disorder, single episode, unspecified: Secondary | ICD-10-CM | POA: Insufficient documentation

## 2014-11-01 DIAGNOSIS — I5032 Chronic diastolic (congestive) heart failure: Secondary | ICD-10-CM | POA: Diagnosis present

## 2014-11-01 DIAGNOSIS — E079 Disorder of thyroid, unspecified: Secondary | ICD-10-CM | POA: Insufficient documentation

## 2014-11-01 DIAGNOSIS — F410 Panic disorder [episodic paroxysmal anxiety] without agoraphobia: Secondary | ICD-10-CM | POA: Insufficient documentation

## 2014-11-01 DIAGNOSIS — R079 Chest pain, unspecified: Secondary | ICD-10-CM | POA: Diagnosis not present

## 2014-11-01 DIAGNOSIS — I252 Old myocardial infarction: Secondary | ICD-10-CM | POA: Diagnosis not present

## 2014-11-01 DIAGNOSIS — K648 Other hemorrhoids: Secondary | ICD-10-CM | POA: Diagnosis not present

## 2014-11-01 DIAGNOSIS — H269 Unspecified cataract: Secondary | ICD-10-CM | POA: Diagnosis not present

## 2014-11-01 DIAGNOSIS — K5289 Other specified noninfective gastroenteritis and colitis: Secondary | ICD-10-CM | POA: Diagnosis not present

## 2014-11-01 DIAGNOSIS — Z7951 Long term (current) use of inhaled steroids: Secondary | ICD-10-CM | POA: Insufficient documentation

## 2014-11-01 DIAGNOSIS — Z87891 Personal history of nicotine dependence: Secondary | ICD-10-CM | POA: Insufficient documentation

## 2014-11-01 DIAGNOSIS — R011 Cardiac murmur, unspecified: Secondary | ICD-10-CM | POA: Diagnosis not present

## 2014-11-01 DIAGNOSIS — R0789 Other chest pain: Secondary | ICD-10-CM | POA: Diagnosis not present

## 2014-11-01 DIAGNOSIS — I2510 Atherosclerotic heart disease of native coronary artery without angina pectoris: Secondary | ICD-10-CM | POA: Diagnosis not present

## 2014-11-01 DIAGNOSIS — M1990 Unspecified osteoarthritis, unspecified site: Secondary | ICD-10-CM | POA: Insufficient documentation

## 2014-11-01 HISTORY — DX: Paresthesia of skin: R20.2

## 2014-11-01 LAB — CBC WITH DIFFERENTIAL/PLATELET
Basophils Absolute: 0.1 10*3/uL (ref 0.0–0.1)
Basophils Relative: 1 % (ref 0–1)
Eosinophils Absolute: 0.1 10*3/uL (ref 0.0–0.7)
Eosinophils Relative: 2 % (ref 0–5)
HCT: 36.6 % (ref 36.0–46.0)
Hemoglobin: 12.6 g/dL (ref 12.0–15.0)
LYMPHS ABS: 2.4 10*3/uL (ref 0.7–4.0)
LYMPHS PCT: 38 % (ref 12–46)
MCH: 29.9 pg (ref 26.0–34.0)
MCHC: 34.4 g/dL (ref 30.0–36.0)
MCV: 86.9 fL (ref 78.0–100.0)
Monocytes Absolute: 0.6 10*3/uL (ref 0.1–1.0)
Monocytes Relative: 9 % (ref 3–12)
NEUTROS ABS: 3 10*3/uL (ref 1.7–7.7)
Neutrophils Relative %: 50 % (ref 43–77)
PLATELETS: 227 10*3/uL (ref 150–400)
RBC: 4.21 MIL/uL (ref 3.87–5.11)
RDW: 19.9 % — AB (ref 11.5–15.5)
WBC: 6.1 10*3/uL (ref 4.0–10.5)

## 2014-11-01 LAB — BASIC METABOLIC PANEL
Anion gap: 9 (ref 5–15)
BUN: 21 mg/dL — ABNORMAL HIGH (ref 6–20)
CO2: 28 mmol/L (ref 22–32)
Calcium: 8.5 mg/dL — ABNORMAL LOW (ref 8.9–10.3)
Chloride: 100 mmol/L — ABNORMAL LOW (ref 101–111)
Creatinine, Ser: 1.08 mg/dL — ABNORMAL HIGH (ref 0.44–1.00)
GFR calc Af Amer: 55 mL/min — ABNORMAL LOW (ref >60)
GFR calc non Af Amer: 47 mL/min — ABNORMAL LOW (ref >60)
Glucose, Bld: 102 mg/dL — ABNORMAL HIGH (ref 65–99)
Potassium: 3.6 mmol/L (ref 3.5–5.1)
Sodium: 137 mmol/L (ref 135–145)

## 2014-11-01 LAB — TROPONIN I: Troponin I: <0.03 ng/mL (ref <0.031)

## 2014-11-01 LAB — BRAIN NATRIURETIC PEPTIDE: B Natriuretic Peptide: 58 pg/mL (ref 0.0–100.0)

## 2014-11-01 MED ORDER — LUBIPROSTONE 24 MCG PO CAPS
24.0000 ug | ORAL_CAPSULE | Freq: Two times a day (BID) | ORAL | Status: DC
  Administered 2014-11-02 (×2): 24 ug via ORAL
  Filled 2014-11-01 (×5): qty 1

## 2014-11-01 MED ORDER — NITROGLYCERIN 0.4 MG SL SUBL: 0.4000 mg | SUBLINGUAL_TABLET | SUBLINGUAL | Status: DC | PRN

## 2014-11-01 MED ORDER — MORPHINE SULFATE 4 MG/ML IJ SOLN
4.0000 mg | INTRAMUSCULAR | Status: AC | PRN
  Administered 2014-11-01 (×2): 4 mg via INTRAVENOUS
  Filled 2014-11-01 (×2): qty 1

## 2014-11-01 MED ORDER — ONDANSETRON HCL 4 MG/2ML IJ SOLN: 4.0000 mg | Freq: Four times a day (QID) | INTRAMUSCULAR | Status: DC | PRN

## 2014-11-01 MED ORDER — HYDROCORTISONE 2.5 % RE CREA
1.0000 "application " | TOPICAL_CREAM | Freq: Two times a day (BID) | RECTAL | Status: DC
  Administered 2014-11-02: 1 via RECTAL
  Filled 2014-11-01: qty 28.35

## 2014-11-01 MED ORDER — ROSUVASTATIN CALCIUM 10 MG PO TABS
5.0000 mg | ORAL_TABLET | Freq: Every day | ORAL | Status: DC
  Administered 2014-11-02: 5 mg via ORAL
  Filled 2014-11-01: qty 1

## 2014-11-01 MED ORDER — NITROGLYCERIN 0.4 MG SL SUBL
0.4000 mg | SUBLINGUAL_TABLET | SUBLINGUAL | Status: AC | PRN
Start: 2014-11-01 — End: 2014-11-01
  Administered 2014-11-01 (×3): 0.4 mg via SUBLINGUAL
  Filled 2014-11-01: qty 1

## 2014-11-01 MED ORDER — RANOLAZINE ER 500 MG PO TB12
500.0000 mg | ORAL_TABLET | Freq: Two times a day (BID) | ORAL | Status: DC
  Administered 2014-11-01 – 2014-11-02 (×2): 500 mg via ORAL
  Filled 2014-11-01 (×2): qty 1

## 2014-11-01 MED ORDER — LORATADINE 10 MG PO TABS
10.0000 mg | ORAL_TABLET | Freq: Every day | ORAL | Status: DC
  Administered 2014-11-02: 10 mg via ORAL
  Filled 2014-11-01: qty 1

## 2014-11-01 MED ORDER — AMLODIPINE BESYLATE 5 MG PO TABS
10.0000 mg | ORAL_TABLET | Freq: Every day | ORAL | Status: DC
  Administered 2014-11-02: 10 mg via ORAL
  Filled 2014-11-01: qty 2

## 2014-11-01 MED ORDER — ASPIRIN 81 MG PO CHEW
324.0000 mg | CHEWABLE_TABLET | Freq: Once | ORAL | Status: AC
  Administered 2014-11-01: 324 mg via ORAL
  Filled 2014-11-01: qty 4

## 2014-11-01 MED ORDER — CARVEDILOL 12.5 MG PO TABS
12.5000 mg | ORAL_TABLET | Freq: Two times a day (BID) | ORAL | Status: DC
  Administered 2014-11-02 (×2): 12.5 mg via ORAL
  Filled 2014-11-01 (×2): qty 1

## 2014-11-01 MED ORDER — FLUTICASONE PROPIONATE 50 MCG/ACT NA SUSP: 1.0000 | Freq: Every day | NASAL | Status: DC | PRN

## 2014-11-01 MED ORDER — ALPRAZOLAM 0.25 MG PO TABS
0.2500 mg | ORAL_TABLET | Freq: Every day | ORAL | Status: DC | PRN
  Administered 2014-11-01: 0.25 mg via ORAL
  Filled 2014-11-01: qty 1

## 2014-11-01 MED ORDER — SODIUM CHLORIDE 0.9 % IV SOLN: INTRAVENOUS | Status: DC

## 2014-11-01 MED ORDER — DULOXETINE HCL 60 MG PO CPEP
60.0000 mg | ORAL_CAPSULE | Freq: Every day | ORAL | Status: DC
  Administered 2014-11-01: 60 mg via ORAL
  Filled 2014-11-01: qty 1

## 2014-11-01 MED ORDER — PANTOPRAZOLE SODIUM 40 MG PO TBEC
40.0000 mg | DELAYED_RELEASE_TABLET | Freq: Every morning | ORAL | Status: DC
  Administered 2014-11-02: 40 mg via ORAL
  Filled 2014-11-01: qty 1

## 2014-11-01 MED ORDER — FUROSEMIDE 40 MG PO TABS
40.0000 mg | ORAL_TABLET | Freq: Every day | ORAL | Status: DC
  Administered 2014-11-02: 40 mg via ORAL
  Filled 2014-11-01: qty 1

## 2014-11-01 MED ORDER — POTASSIUM CHLORIDE CRYS ER 20 MEQ PO TBCR
20.0000 meq | EXTENDED_RELEASE_TABLET | Freq: Every day | ORAL | Status: DC
  Administered 2014-11-02: 20 meq via ORAL
  Filled 2014-11-01: qty 2
  Filled 2014-11-01: qty 1

## 2014-11-01 MED ORDER — ACETAMINOPHEN 325 MG PO TABS: 650.0000 mg | ORAL_TABLET | ORAL | Status: DC | PRN

## 2014-11-01 MED ORDER — ENOXAPARIN SODIUM 40 MG/0.4ML ~~LOC~~ SOLN
40.0000 mg | SUBCUTANEOUS | Status: DC
  Administered 2014-11-01: 40 mg via SUBCUTANEOUS
  Filled 2014-11-01: qty 0.4

## 2014-11-01 MED ORDER — ASPIRIN EC 81 MG PO TBEC
81.0000 mg | DELAYED_RELEASE_TABLET | Freq: Every morning | ORAL | Status: DC
  Administered 2014-11-02: 81 mg via ORAL
  Filled 2014-11-01: qty 1

## 2014-11-01 MED ORDER — CYCLOBENZAPRINE HCL 10 MG PO TABS: 10.0000 mg | ORAL_TABLET | Freq: Three times a day (TID) | ORAL | Status: DC | PRN

## 2014-11-01 NOTE — ED Provider Notes (Signed)
CSN: [id number]     Arrival date & time 11/01/14  1905 History   First MD Initiated Contact with Patient 11/01/14 1907     Chief Complaint  Patient presents with  . Chest Pain     HPI Pt was seen at 1910. Per pt, c/o gradual onset and worsening of multiple intermittent episodes of chest "pain" for the past 3 days. Pt describes the CP as "first sharp" then "dull," with radiation into her back and neck. Has been associated with SOB and diaphoresis. Pt states her symptoms have been lasting approximately 30+ min before resolving. Pt states today her symptoms "were coming on more and more frequently" and "worse," so she took one SL ntg without improvement. Pt further describes her symptoms as "like when they tell me it's my heart" and "when I needed a stent."  Denies palpitations, no cough, no injury, no abd pain, no N/V/D, no fevers, no rash.    Past Medical History  Diagnosis Date  . PVD (peripheral vascular disease)   . Other and unspecified hyperlipidemia   . Coronary atherosclerosis of native coronary artery     nonobstructive CAD by multiple catheterizations  . Panic disorder without agoraphobia   . HTN (hypertension)     Hx of it  . MI (myocardial infarction) 2006  . Internal hemorrhoids without mention of complication   . Dysphagia, unspecified(787.20)   . Microscopic colitis 2003  . GERD (gastroesophageal reflux disease)     Hx Schatzki's ring, multiple EGD/ED last 01/06/2004  . Hyperlipemia   . Thyroid disease     recent abnl TSH per pt  . S/P colonoscopy 09/27/2001    internal hemorrhoids, desc colon inflam polyp, SB BX-chronic duodenitis, colitis  . Arthritis   . Shortness of breath   . Heart disease   . Bursitis     left shoulder  . Hyperlipidemia   . Sleep disorder     obstructive  . COPD (chronic obstructive pulmonary disease)   . Complication of anesthesia   . PONV (postoperative nausea and vomiting)     'a little nausea"  . Anxiety   . Depression   . Pneumonia  12/2011  . Heart murmur     'a littel'  . Cataract   . Paresthesia     hands, feet   Past Surgical History  Procedure Laterality Date  . Tonsillectomy    . Unspecified area, hysterectomy  1972    partial  . Cholecystectomy  1998  . Right knee replacement  2007  . Right leg benign tumor    . Breast lumpectomy  1998    left, benign  . Left hand surgery    . Left rotator cuff surgery    . Nasal sinus surgery    . Bladder tack  06/2010  . Maloney dilation  03/16/2011    Gastritis. No H.pylori on bx. 17F maloney dilation with disruption of  occult cevical esophageal web  . Colonoscopy  03/16/2011    multiple hyperplastic colon polyps, sigmoid diverticulosis, melanosis coli  . Bladder suspension  11/09/2011    Procedure: TRANSVAGINAL TAPE (TVT) PROCEDURE;  Surgeon: Marissa Nestle, MD;  Location: [facility];  Service: Urology;  Laterality: N/A;  . Abdominal hysterectomy    . Appendectomy    . Carpal tunnel release  1989    left  . Anterior and posterior repair      with resection of vagina  . Shoulder surgery Left   . Joint replacement Right 2007  .  Lumbar laminectomy/decompression microdiscectomy N/A 10/11/2012    Procedure: LUMBAR LAMINECTOMY/DECOMPRESSION MICRODISCECTOMY 2 LEVELS;  Surgeon: Floyce Stakes, MD;  Location: [place] [facility];  Service: Neurosurgery;  Laterality: N/A;  L3-4 L4-5 Laminectomy  . Left heart catheterization with coronary angiogram N/A 05/14/2013    Procedure: LEFT HEART CATHETERIZATION WITH CORONARY ANGIOGRAM;  Surgeon: Blane Ohara, MD;  Location: [facility] CATH [facility];  Service: Cardiovascular;  Laterality: N/A;  . Cardiac catheterization    . Cardiac catheterization    . Coronary angioplasty with stent placement     Family History  Problem Relation Age of Onset  . Coronary artery disease Other     family Hx-sons  . Cancer Other   . Stroke Other     family Hx  . Hypertension Other     family Hx  . Diabetes Brother    History  Substance Use Topics  .  Smoking status: Former Smoker -- 1.00 packs/day for 64 years    Types: Cigarettes    Start date: 12/24/1947    Quit date: 11/17/2001  . Smokeless tobacco: Never Used     Comment: Quit smoking in 2003  . Alcohol Use: No   OB History    Gravida Para Term Preterm AB TAB SAB Ectopic Multiple Living   8 6   2     5      Review of Systems ROS: Statement: All systems negative except as marked or noted in the HPI; Constitutional: Negative for fever and chills. ; ; Eyes: Negative for eye pain, redness and discharge. ; ; ENMT: Negative for ear pain, hoarseness, nasal congestion, sinus pressure and sore throat. ; ; Cardiovascular: +CP, SOB, diaphoresis. Negative for palpitations, and peripheral edema. ; ; Respiratory: Negative for cough, wheezing and stridor. ; ; Gastrointestinal: Negative for nausea, vomiting, diarrhea, abdominal pain, blood in stool, hematemesis, jaundice and rectal bleeding. . ; ; Genitourinary: Negative for dysuria, flank pain and hematuria. ; ; Musculoskeletal: Negative for back pain and neck pain. Negative for swelling and trauma.; ; Skin: Negative for pruritus, rash, abrasions, blisters, bruising and skin lesion.; ; Neuro: Negative for headache, lightheadedness and neck stiffness. Negative for weakness, altered level of consciousness , altered mental status, extremity weakness, paresthesias, involuntary movement, seizure and syncope.      Allergies  Levaquin; Macrodantin; Phenothiazines; Polysorbate; Prednisone; Buspirone; Cardura; Codeine; Prochlorperazine; Atorvastatin; Ofloxacin; Other; Penicillins; and Pimozide  Home Medications   Prior to Admission medications   Medication Sig Start Date End Date Taking? Authorizing Provider  ALPRAZolam Duanne Moron) 0.25 MG tablet Take 0.25 mg by mouth daily as needed for anxiety.     Historical Provider, MD  alum & mag hydroxide-simeth (MAALOX/MYLANTA) 200-200-20 MG/5ML suspension Take 30 mLs by mouth every 6 (six) hours as needed for  indigestion or heartburn (dyspepsia). 11/26/13   Radene Gunning, NP  amLODipine (NORVASC) 10 MG tablet Take 1 tablet (10 mg total) by mouth daily. 08/20/13   Herminio Commons, MD  aspirin 81 MG EC tablet Take 81 mg by mouth every morning.     Historical Provider, MD  carvedilol (COREG) 12.5 MG tablet TAKE ONE TABLET BY MOUTH TWICE DAILY. 08/13/14   Herminio Commons, MD  CRESTOR 5 MG tablet Take 5 mg by mouth every morning.  10/24/13   Historical Provider, MD  DULoxetine (CYMBALTA) 60 MG capsule Take 60 mg by mouth at bedtime.     Historical Provider, MD  fluticasone (VERAMYST) 27.5 MCG/SPRAY nasal spray Place 1 spray into the nose daily.  Historical Provider, MD  furosemide (LASIX) 40 MG tablet Take 40 mg 2 times daily until 08/15/14 then return to 40 mg daily 08/12/14   Herminio Commons, MD  hydrocortisone (PROCTOCORT) 10 % rectal foam Place 1 applicator rectally 2 (two) times daily. For 14 daysa. 09/27/14   Mahala Menghini, PA-C  hydrocortisone (PROCTOSOL HC) 2.5 % rectal cream Place 1 application rectally 2 (two) times daily. For 14 days. 09/28/14   Mahala Menghini, PA-C  lubiprostone (AMITIZA) 24 MCG capsule Take 1 capsule (24 mcg total) by mouth 2 (two) times daily with a meal. 08/06/14   Mahala Menghini, PA-C  NON FORMULARY 1 suppository. Gentle Laxative 10 mg suppository   One  every other night    Historical Provider, MD  pantoprazole (PROTONIX) 40 MG tablet Take 40 mg by mouth every morning.     Historical Provider, MD  potassium chloride (K-DUR) 10 MEQ tablet Take 40 meq until 08/15/14 then 20 meq daily 08/12/14   Herminio Commons, MD  POTASSIUM GLUCONATE PO Take 99 mg by mouth every morning.     Historical Provider, MD  QC LORATADINE ALLERGY RELIEF 10 MG tablet Take 10 mg by mouth daily. 04/22/14   Historical Provider, MD  RANEXA 500 MG 12 hr tablet TAKE ONE TABLET BY MOUTH TWICE DAILY. 03/31/14   Herminio Commons, MD   BP 162/119 mmHg  Pulse 67  Temp(Src) 97.9 F (36.6 C) (Oral)   Resp 15  Ht 5\' 1"  (1.549 m)  Wt 182 lb (82.555 kg)  BMI 34.41 kg/m2  SpO2 97% Physical Exam  1915: Physical examination:  Nursing notes reviewed; Vital signs and O2 SAT reviewed;  Constitutional: Well developed, Well nourished, Well hydrated, In no acute distress; Head:  Normocephalic, atraumatic; Eyes: EOMI, PERRL, No scleral icterus; ENMT: Mouth and pharynx normal, Mucous membranes moist; Neck: Supple, Full range of motion, No lymphadenopathy; Cardiovascular: Regular rate and rhythm, No gallop; Respiratory: Breath sounds clear & equal bilaterally, No wheezes.  Speaking full sentences with ease, Normal respiratory effort/excursion; Chest: +left parasternal and anterior chest wall tender to palp. No rash, no soft tissue crepitus, no deformity. Movement normal; Abdomen: Soft, Nontender, Nondistended, Normal bowel sounds; Genitourinary: No CVA tenderness; Extremities: Pulses normal, No tenderness, No edema, No calf edema or asymmetry.; Neuro: AA&Ox3, Major CN grossly intact.  Speech clear. No gross focal motor or sensory deficits in extremities.; Skin: Color normal, Warm, Dry.   ED Course  Procedures     EKG Interpretation   Date/Time:  Sunday November 01 2014 19:12:21 EDT Ventricular Rate:  73 PR Interval:  114 QRS Duration: 89 QT Interval:  438 QTC Calculation: 483 R Axis:   -24 Text Interpretation:  Sinus or ectopic atrial rhythm Borderline short PR  interval Borderline left axis deviation Low voltage, precordial leads  Baseline wander When compared with ECG of 07/29/2014 No significant change  was found Confirmed by [facility]  MD, Nunzio Cory [phone]) on 11/01/2014 7:19:41  PM      MDM  MDM Reviewed: previous chart, nursing note and vitals Reviewed previous: labs and ECG Interpretation: labs, ECG and x-ray      Results for orders placed or performed during the hospital encounter of 11/01/14  CBC with Differential  Result Value Ref Range   WBC 6.1 4.0 - 10.5 K/uL   RBC 4.21 3.87 -  5.11 MIL/uL   Hemoglobin 12.6 12.0 - 15.0 g/dL   HCT 36.6 36.0 - 46.0 %   MCV 86.9 78.0 -  100.0 fL   MCH 29.9 26.0 - 34.0 pg   MCHC 34.4 30.0 - 36.0 g/dL   RDW 19.9 (H) 11.5 - 15.5 %   Platelets 227 150 - 400 K/uL   Neutrophils Relative % 50 43 - 77 %   Neutro Abs 3.0 1.7 - 7.7 K/uL   Lymphocytes Relative 38 12 - 46 %   Lymphs Abs 2.4 0.7 - 4.0 K/uL   Monocytes Relative 9 3 - 12 %   Monocytes Absolute 0.6 0.1 - 1.0 K/uL   Eosinophils Relative 2 0 - 5 %   Eosinophils Absolute 0.1 0.0 - 0.7 K/uL   Basophils Relative 1 0 - 1 %   Basophils Absolute 0.1 0.0 - 0.1 K/uL  Troponin I  Result Value Ref Range   Troponin I <0.03 <0.031 ng/mL  Basic metabolic panel  Result Value Ref Range   Sodium 137 135 - 145 mmol/L   Potassium 3.6 3.5 - 5.1 mmol/L   Chloride 100 (L) 101 - 111 mmol/L   CO2 28 22 - 32 mmol/L   Glucose, Bld 102 (H) 65 - 99 mg/dL   BUN 21 (H) 6 - 20 mg/dL   Creatinine, Ser 1.08 (H) 0.44 - 1.00 mg/dL   Calcium 8.5 (L) 8.9 - 10.3 mg/dL   GFR calc non Af Amer 47 (L) >60 mL/min   GFR calc Af Amer 55 (L) >60 mL/min   Anion gap 9 5 - 15  Brain natriuretic peptide  Result Value Ref Range   B Natriuretic Peptide 58.0 0.0 - 100.0 pg/mL   Dg Chest Portable 1 View 11/01/2014   CLINICAL DATA:  Central chest pain for 2 days  EXAM: PORTABLE CHEST - 1 VIEW  COMPARISON:  07/29/2014  FINDINGS: The heart size and mediastinal contours are within normal limits. Both lungs are clear. The visualized skeletal structures are unremarkable.  IMPRESSION: No acute abnormality noted.   Electronically Signed   By: Inez Catalina M.D.   On: 11/01/2014 19:39    2030:  Symptoms improving with meds. Heart score 5; will observation admit. Dx and testing d/w pt.  Questions answered.  Verb understanding, agreeable to admit.   T/C to [facility]. Anastasio Champion, case discussed, including:  HPI, pertinent PM/SHx, VS/PE, dx testing, ED course and treatment:  Agreeable to admit, requests to write temporary orders, obtain  observation tele bed to team [facility].   Francine Graven, DO 11/03/14 2214

## 2014-11-01 NOTE — ED Notes (Signed)
Pt c/o severe central chest pain that started x 2 days ago and has gotten progressively worse; pt states the pain radiates to her back and up her left neck

## 2014-11-01 NOTE — H&P (Signed)
[facility] History and Physical  DANIS CIANO [id number] DOB: 07-14-1932 DOA: 11/01/2014  Referring physician: ER, Dr. Thurnell Garbe PCP: Tawni Carnes, PA-C   Chief Complaint: Left-sided chest pain  HPI: HARU MEINECKE is a 81 y.o. female  This is an 81 year old lady who has had intermittent left inferior mammary chest pain for the last 3 days. Each episode of pain is sharp and radiates to the back and lasts only approximately 1 minute. She has had several of these episodes in the last 3 days. She came to the emergency room because today it lasted longer, she says she still has it after presumably several hours. It is not associated with sweating, nausea or vomiting. She says that she has had dyspnea with this pain. She denies any palpitations. She has had a cough over the last week or so associated with nasal congestion and sinus congestion. She denies any fever. She does have a history of coronary artery disease, hypertension and peripheral vascular disease. She has had stenting procedure.  Review of Systems:  Apart from symptoms above, all systems negative.  Past Medical History  Diagnosis Date  . PVD (peripheral vascular disease)   . Other and unspecified hyperlipidemia   . Coronary atherosclerosis of native coronary artery     nonobstructive CAD by multiple catheterizations  . Panic disorder without agoraphobia   . HTN (hypertension)     Hx of it  . MI (myocardial infarction) 2006  . Internal hemorrhoids without mention of complication   . Dysphagia, unspecified(787.20)   . Microscopic colitis 2003  . GERD (gastroesophageal reflux disease)     Hx Schatzki's ring, multiple EGD/ED last 01/06/2004  . Hyperlipemia   . Thyroid disease     recent abnl TSH per pt  . S/P colonoscopy 09/27/2001    internal hemorrhoids, desc colon inflam polyp, SB BX-chronic duodenitis, colitis  . Arthritis   . Shortness of breath   . Heart disease   . Bursitis     left shoulder  .  Hyperlipidemia   . Sleep disorder     obstructive  . COPD (chronic obstructive pulmonary disease)   . Complication of anesthesia   . PONV (postoperative nausea and vomiting)     'a little nausea"  . Anxiety   . Depression   . Pneumonia 12/2011  . Heart murmur     'a littel'  . Cataract   . Paresthesia     hands, feet   Past Surgical History  Procedure Laterality Date  . Tonsillectomy    . Unspecified area, hysterectomy  1972    partial  . Cholecystectomy  1998  . Right knee replacement  2007  . Right leg benign tumor    . Breast lumpectomy  1998    left, benign  . Left hand surgery    . Left rotator cuff surgery    . Nasal sinus surgery    . Bladder tack  06/2010  . Maloney dilation  03/16/2011    Gastritis. No H.pylori on bx. 41F maloney dilation with disruption of  occult cevical esophageal web  . Colonoscopy  03/16/2011    multiple hyperplastic colon polyps, sigmoid diverticulosis, melanosis coli  . Bladder suspension  11/09/2011    Procedure: TRANSVAGINAL TAPE (TVT) PROCEDURE;  Surgeon: Marissa Nestle, MD;  Location: [facility];  Service: Urology;  Laterality: N/A;  . Abdominal hysterectomy    . Appendectomy    . Carpal tunnel release  1989    left  . Anterior  and posterior repair      with resection of vagina  . Shoulder surgery Left   . Joint replacement Right 2007  . Lumbar laminectomy/decompression microdiscectomy N/A 10/11/2012    Procedure: LUMBAR LAMINECTOMY/DECOMPRESSION MICRODISCECTOMY 2 LEVELS;  Surgeon: Floyce Stakes, MD;  Location: [place] [facility];  Service: Neurosurgery;  Laterality: N/A;  L3-4 L4-5 Laminectomy  . Left heart catheterization with coronary angiogram N/A 05/14/2013    Procedure: LEFT HEART CATHETERIZATION WITH CORONARY ANGIOGRAM;  Surgeon: Blane Ohara, MD;  Location[facility] LAB;  Service: Cardiovascular;  Laterality: N/A;  . Cardiac catheterization    . Cardiac catheterization    . Coronary angioplasty with stent placement     Social  History:  reports that she quit smoking about 12 years ago. Her smoking use included Cigarettes. She started smoking about 66 years ago. She has a 64 pack-year smoking history. She has never used smokeless tobacco. She reports that she does not drink alcohol or use illicit drugs.  Allergies  Allergen Reactions  . Levaquin [Levofloxacin In D5w] Swelling  . Macrodantin [Nitrofurantoin Macrocrystal] Swelling  . Phenothiazines Anaphylaxis and Hives  . Polysorbate Anaphylaxis  . Prednisone Shortness Of Breath  . Buspirone Itching  . Cardura [Doxazosin Mesylate] Itching  . Codeine Itching  . Prochlorperazine Other (See Comments)    unknown  . Atorvastatin Hives    Cramping; tolerates Crestor ok  . Ofloxacin Rash  . Other Itching and Rash    "WOOL"= make skin look like it has been burned  . Penicillins Other (See Comments)    Causes redness all over.   . Pimozide Hives and Itching    Family History  Problem Relation Age of Onset  . Coronary artery disease Other     family Hx-sons  . Cancer Other   . Stroke Other     family Hx  . Hypertension Other     family Hx  . Diabetes Brother     Prior to Admission medications   Medication Sig Start Date End Date Taking? Authorizing Provider  ALPRAZolam Duanne Moron) 0.25 MG tablet Take 0.25 mg by mouth daily as needed for anxiety.    Yes Historical Provider, MD  amLODipine (NORVASC) 10 MG tablet Take 1 tablet (10 mg total) by mouth daily. 08/20/13  Yes Herminio Commons, MD  aspirin 81 MG EC tablet Take 81 mg by mouth every morning.    Yes Historical Provider, MD  carvedilol (COREG) 12.5 MG tablet TAKE ONE TABLET BY MOUTH TWICE DAILY. 08/13/14  Yes Herminio Commons, MD  CRESTOR 5 MG tablet Take 5 mg by mouth every morning.  10/24/13  Yes Historical Provider, MD  cyclobenzaprine (FLEXERIL) 10 MG tablet Take 10 mg by mouth 3 (three) times daily as needed for muscle spasms.   Yes Historical Provider, MD  DULoxetine (CYMBALTA) 60 MG capsule Take 60  mg by mouth at bedtime.    Yes Historical Provider, MD  furosemide (LASIX) 40 MG tablet Take 40 mg 2 times daily until 08/15/14 then return to 40 mg daily Patient taking differently: Take 40 mg by mouth daily.  08/12/14  Yes Herminio Commons, MD  hydrocortisone (PROCTOCORT) 10 % rectal foam Place 1 applicator rectally 2 (two) times daily. For 14 daysa. 09/27/14  Yes Mahala Menghini, PA-C  hydrocortisone (PROCTOSOL HC) 2.5 % rectal cream Place 1 application rectally 2 (two) times daily. For 14 days. 09/28/14  Yes Mahala Menghini, PA-C  lubiprostone (AMITIZA) 24 MCG capsule Take 1  capsule (24 mcg total) by mouth 2 (two) times daily with a meal. 08/06/14  Yes Mahala Menghini, PA-C  nitroGLYCERIN (NITROSTAT) 0.4 MG SL tablet Place 0.4 mg under the tongue every 5 (five) minutes as needed for chest pain.   Yes Historical Provider, MD  pantoprazole (PROTONIX) 40 MG tablet Take 40 mg by mouth every morning.    Yes Historical Provider, MD  potassium chloride (K-DUR) 10 MEQ tablet Take 40 meq until 08/15/14 then 20 meq daily Patient taking differently: Take 20 mEq by mouth daily.  08/12/14  Yes Herminio Commons, MD  QC LORATADINE ALLERGY RELIEF 10 MG tablet Take 10 mg by mouth daily. 04/22/14  Yes Historical Provider, MD  RANEXA 500 MG 12 hr tablet TAKE ONE TABLET BY MOUTH TWICE DAILY. 03/31/14  Yes Herminio Commons, MD  alum & mag hydroxide-simeth (MAALOX/MYLANTA) 200-200-20 MG/5ML suspension Take 30 mLs by mouth every 6 (six) hours as needed for indigestion or heartburn (dyspepsia). 11/26/13   Radene Gunning, NP  fluticasone (VERAMYST) 27.5 MCG/SPRAY nasal spray Place 1 spray into the nose daily as needed for rhinitis or allergies.     Historical Provider, MD   Physical Exam: Filed Vitals:   11/01/14 2000 11/01/14 2030 11/01/14 2045 11/01/14 2109  BP: 130/64 144/60  149/60  Pulse: 67 66 76 73  Temp:    98.7 F (37.1 C)  TempSrc:    Oral  Resp: 16 11 17 19   Height:    5\' 1"  (1.549 m)  Weight:    83 kg  (182 lb 15.7 oz)  SpO2: 91% 93% 96% 92%    Wt Readings from Last 3 Encounters:  11/01/14 83 kg (182 lb 15.7 oz)  08/06/14 83.19 kg (183 lb 6.4 oz)  07/31/14 84.3 kg (185 lb 13.6 oz)    General:  Appears calm and comfortable. She does not appear to be in severe pain at rest. Eyes: PERRL, normal lids, irises & conjunctiva ENT: grossly normal hearing, lips & tongue Neck: no LAD, masses or thyromegaly Cardiovascular: RRR, no m/r/g. No LE edema. There is no pericardial rub. Telemetry: SR, no arrhythmias  Respiratory: CTA bilaterally, no w/r/r. Normal respiratory effort. There is no pleural rub. Abdomen: soft, ntnd Skin: no rash or induration seen on limited exam Musculoskeletal: Left inferior mammary pain on palpation of the chest wall, reproducing her pain. Psychiatric: grossly normal mood and affect, speech fluent and appropriate Neurologic: grossly non-focal.          Labs on Admission:  Basic Metabolic Panel:  Recent Labs Lab 11/01/14 1920  NA 137  K 3.6  CL 100*  CO2 28  GLUCOSE 102*  BUN 21*  CREATININE 1.08*  CALCIUM 8.5*   Liver Function Tests: No results for input(s): AST, ALT, ALKPHOS, BILITOT, PROT, ALBUMIN in the last 168 hours. No results for input(s): LIPASE, AMYLASE in the last 168 hours. No results for input(s): AMMONIA in the last 168 hours. CBC:  Recent Labs Lab 11/01/14 1920  WBC 6.1  NEUTROABS 3.0  HGB 12.6  HCT 36.6  MCV 86.9  PLT 227   Cardiac Enzymes:  Recent Labs Lab 11/01/14 1920  TROPONINI <0.03    BNP (last 3 results)  Recent Labs  07/29/14 1838 11/01/14 1920  BNP 280.0* 58.0    ProBNP (last 3 results)  Recent Labs  11/24/13 1110  PROBNP 292.3    CBG: No results for input(s): GLUCAP in the last 168 hours.  Radiological Exams on Admission: Dg Chest  Portable 1 View  11/01/2014   CLINICAL DATA:  Central chest pain for 2 days  EXAM: PORTABLE CHEST - 1 VIEW  COMPARISON:  07/29/2014  FINDINGS: The heart size and  mediastinal contours are within normal limits. Both lungs are clear. The visualized skeletal structures are unremarkable.  IMPRESSION: No acute abnormality noted.   Electronically Signed   By: Inez Catalina M.D.   On: 11/01/2014 19:39    EKG: Independently reviewed. Normal sinus rhythm without any acute ST-T wave changes.  Assessment/Plan   1. Left-sided chest pain. The etiology appears to be musculoskeletal in nature by history. However, since it is pleuritic type of chest pain, I will obtain a d-dimer. If the d-dimer is elevated, she will need a CT chest angiogram to rule out pulmonary embolism.  Further recommendations will depend on patient's hospital progress  Code Status: Full code.   DVT Prophylaxis: Lovenox.   Family Communication: I discussed the plan with the patient at the bedside.   Disposition Plan: Home when medically stable.   Time spent: 45 minutes.  Doree Albee [facility] Pager [phone].

## 2014-11-01 NOTE — ED Notes (Addendum)
Randye Hugley - pt's daughter and [facility]  [phone]

## 2014-11-02 ENCOUNTER — Other Ambulatory Visit: Payer: Self-pay

## 2014-11-02 DIAGNOSIS — K219 Gastro-esophageal reflux disease without esophagitis: Secondary | ICD-10-CM | POA: Diagnosis not present

## 2014-11-02 DIAGNOSIS — E785 Hyperlipidemia, unspecified: Secondary | ICD-10-CM | POA: Diagnosis not present

## 2014-11-02 DIAGNOSIS — D649 Anemia, unspecified: Secondary | ICD-10-CM

## 2014-11-02 DIAGNOSIS — I739 Peripheral vascular disease, unspecified: Secondary | ICD-10-CM | POA: Diagnosis not present

## 2014-11-02 DIAGNOSIS — I2510 Atherosclerotic heart disease of native coronary artery without angina pectoris: Secondary | ICD-10-CM | POA: Diagnosis not present

## 2014-11-02 DIAGNOSIS — I5032 Chronic diastolic (congestive) heart failure: Secondary | ICD-10-CM | POA: Diagnosis not present

## 2014-11-02 DIAGNOSIS — R079 Chest pain, unspecified: Secondary | ICD-10-CM | POA: Insufficient documentation

## 2014-11-02 LAB — CBC
HEMATOCRIT: 34.2 % — AB (ref 36.0–46.0)
Hemoglobin: 11.8 g/dL — ABNORMAL LOW (ref 12.0–15.0)
MCH: 30.1 pg (ref 26.0–34.0)
MCHC: 34.5 g/dL (ref 30.0–36.0)
MCV: 87.2 fL (ref 78.0–100.0)
PLATELETS: 206 10*3/uL (ref 150–400)
RBC: 3.92 MIL/uL (ref 3.87–5.11)
RDW: 19.7 % — ABNORMAL HIGH (ref 11.5–15.5)
WBC: 6.6 10*3/uL (ref 4.0–10.5)

## 2014-11-02 LAB — TROPONIN I: Troponin I: <0.03 ng/mL (ref <0.031)

## 2014-11-02 LAB — CREATININE, SERUM
Creatinine, Ser: 1.17 mg/dL — ABNORMAL HIGH (ref 0.44–1.00)
GFR calc Af Amer: 50 mL/min — ABNORMAL LOW (ref >60)
GFR calc non Af Amer: 43 mL/min — ABNORMAL LOW (ref >60)

## 2014-11-02 LAB — D-DIMER, QUANTITATIVE: D-Dimer, Quant: 0.32 ug/mL-FEU (ref 0.00–0.48)

## 2014-11-02 MED ORDER — TRAMADOL HCL 50 MG PO TABS: 50.0000 mg | ORAL_TABLET | Freq: Four times a day (QID) | ORAL | Status: DC | PRN

## 2014-11-02 NOTE — Discharge Summary (Signed)
Physician Discharge Summary  [name] [id number] DOB: 1932/09/14 DOA: 11/01/2014  PCP: [name]  Admit date: 11/01/2014 Discharge date: 11/02/2014  Time spent: 40 minutes  Recommendations for Outpatient Follow-up:  1. Has follow up appointment with Jory Sims 11/10/14 for evaluation of resolution of chest pain. No med changes.   2. PCP 1-2 weeks for evaluation of chronic pain (chest, legs, feet) possible reflux and ongoing LE discomfort  Discharge Diagnoses:  Principal Problem:   Chest pain Active Problems:   CAD, NATIVE VESSEL - PCI + DES to left circumflex 05/14/13   Chronic diastolic heart failure   GASTROESOPHAGEAL REFLUX DISEASE, CHRONIC   History of cardiovascular disorder   Discharge Condition: stable Diet recommendation: heart healthy  Filed Weights   11/01/14 1917 11/01/14 2109 11/02/14 0542  Weight: 82.555 kg (182 lb) 83 kg (182 lb 15.7 oz) 83 kg (182 lb 15.7 oz)    History of present illness:  This is an 81 year old lady who had intermittent left inferior mammary chest pain for previous several months worsening over previous 3 days presented to ED on 11/01/14 with cc chest pain. Each episode of pain described as sharp and radiated to the back and lasted only approximately 1 minute. She had several of these episodes in previous 3 days. She came to the emergency room because  it lasted longer, she said she still had it after presumably several hours. It was not associated with sweating, nausea or vomiting. She said that she  had dyspnea with this pain. She denied any palpitations. She  had a cough over the previious week or so associated with nasal congestion and sinus congestion. She denied any fever. She does have a history of coronary artery disease, hypertension and peripheral vascular disease. She has had stenting procedure. Hospital Course:  Left-sided chest pain. Atypical in pt with hx CAD, HTN hyperlipidemia. Hx of same over last several months.  Occured at rest and with activity. The etiology appeared to be musculoskeletal in nature by history given on admission and exam. Since it was pleuritic type of chest pain  a d-dimer obtained which was within the limits of normal. No events on tele, EKG without acute changes, troponin negative x3. She was provided with ASA and her statin continued. Pain reproducible on exam.  Pain improved but not resolved during hospitalization. Will discharge with follow up with cardiology 11/10/14. Continue PPI  Chronic diastolic heart failure: compensated during this hospitalization.   GERD: tolerated diet well. Continue PPI recommend follow up with PCP for evaluation of worsening reflux  Procedures:  none  Consultations:  none  Discharge Exam: Filed Vitals:   11/02/14 1226  BP: 147/41  Pulse: 65  Temp:   Resp: 16    General: well nourished appears comfortable but anxious Cardiovascular: RRR no MGR No LE edema chest tender to palpation left anterior particularly under left breast.  Respiratory: normal effort BS clear bilaterally no wheeze  Discharge Instructions    Current Discharge Medication List    CONTINUE these medications which have NOT CHANGED   Details  ALPRAZolam (XANAX) 0.25 MG tablet Take 0.25 mg by mouth daily as needed for anxiety.     amLODipine (NORVASC) 10 MG tablet Take 1 tablet (10 mg total) by mouth daily. Qty: 30 tablet, Refills: 6    aspirin 81 MG EC tablet Take 81 mg by mouth every morning.     carvedilol (COREG) 12.5 MG tablet TAKE ONE TABLET BY MOUTH TWICE DAILY. Qty: 60 tablet, Refills: 6  CRESTOR 5 MG tablet Take 5 mg by mouth every morning.     cyclobenzaprine (FLEXERIL) 10 MG tablet Take 10 mg by mouth 3 (three) times daily as needed for muscle spasms.    DULoxetine (CYMBALTA) 60 MG capsule Take 60 mg by mouth at bedtime.     furosemide (LASIX) 40 MG tablet Take 40 mg 2 times daily until 08/15/14 then return to 40 mg daily Qty: 36 tablet, Refills: 3     hydrocortisone (PROCTOSOL HC) 2.5 % rectal cream Place 1 application rectally 2 (two) times daily. For 14 days. Qty: 30 g, Refills: 0    lubiprostone (AMITIZA) 24 MCG capsule Take 1 capsule (24 mcg total) by mouth 2 (two) times daily with a meal. Qty: 60 capsule, Refills: 3    nitroGLYCERIN (NITROSTAT) 0.4 MG SL tablet Place 0.4 mg under the tongue every 5 (five) minutes as needed for chest pain.    pantoprazole (PROTONIX) 40 MG tablet Take 40 mg by mouth every morning.     potassium chloride (K-DUR) 10 MEQ tablet Take 40 meq until 08/15/14 then 20 meq daily Qty: 84 tablet, Refills: 3    QC LORATADINE ALLERGY RELIEF 10 MG tablet Take 10 mg by mouth daily. Refills: 4    RANEXA 500 MG 12 hr tablet TAKE ONE TABLET BY MOUTH TWICE DAILY. Qty: 60 tablet, Refills: 6    alum & mag hydroxide-simeth (MAALOX/MYLANTA) [id number] MG/5ML suspension Take 30 mLs by mouth every 6 (six) hours as needed for indigestion or heartburn (dyspepsia). Qty: 355 mL, Refills: 0    fluticasone (VERAMYST) 27.5 MCG/SPRAY nasal spray Place 1 spray into the nose daily as needed for rhinitis or allergies.       STOP taking these medications     hydrocortisone (PROCTOCORT) 10 % rectal foam        Allergies  Allergen Reactions  . Levaquin [Levofloxacin In D5w] Swelling  . Macrodantin [Nitrofurantoin Macrocrystal] Swelling  . Phenothiazines Anaphylaxis and Hives  . Polysorbate Anaphylaxis  . Prednisone Shortness Of Breath  . Buspirone Itching  . Cardura [Doxazosin Mesylate] Itching  . Codeine Itching  . Prochlorperazine Other (See Comments)    unknown  . Atorvastatin Hives    Cramping; tolerates Crestor ok  . Ofloxacin Rash  . Other Itching and Rash    "WOOL"= make skin look like it has been burned  . Penicillins Other (See Comments)    Causes redness all over.   . Pimozide Hives and Itching   Follow-up Information    Follow up with Jory Sims, NP On 11/10/2014.   Specialties:  Nurse  Practitioner, Radiology, Cardiology   Why:  appointment at 1:50   Contact information:   [place] [address] [phone]       Follow up with [facility], PA-C. Schedule an appointment as soon as possible for a visit in 1 week.   Specialty:  Physician Assistant   Contact information:   [place] [id number]        The results of significant diagnostics from this hospitalization (including imaging, microbiology, ancillary and laboratory) are listed below for reference.    Significant Diagnostic Studies: Dg Chest Portable 1 View  11/01/2014   CLINICAL DATA:  Central chest pain for 2 days  EXAM: PORTABLE CHEST - 1 VIEW  COMPARISON:  07/29/2014  FINDINGS: The heart size and mediastinal contours are within normal limits. Both lungs are clear. The visualized skeletal structures are unremarkable.  IMPRESSION: No acute  abnormality noted.   Electronically Signed   By: Inez Catalina M.D.   On: 11/01/2014 19:39    Microbiology: No results found for this or any previous visit (from the past 240 hour(s)).   Labs: Basic Metabolic Panel:  Recent Labs Lab 11/01/14 1920 11/01/14 2325  NA 137  --   K 3.6  --   CL 100*  --   CO2 28  --   GLUCOSE 102*  --   BUN 21*  --   CREATININE 1.08* 1.17*  CALCIUM 8.5*  --    Liver Function Tests: No results for input(s): AST, ALT, ALKPHOS, BILITOT, PROT, ALBUMIN in the last 168 hours. No results for input(s): LIPASE, AMYLASE in the last 168 hours. No results for input(s): AMMONIA in the last 168 hours. CBC:  Recent Labs Lab 11/01/14 1920 11/01/14 2325  WBC 6.1 6.6  NEUTROABS 3.0  --   HGB 12.6 11.8*  HCT 36.6 34.2*  MCV 86.9 87.2  PLT 227 206   Cardiac Enzymes:  Recent Labs Lab 11/01/14 1920 11/02/14 0227 11/02/14 0628  TROPONINI <0.03 <0.03 <0.03   BNP: BNP (last 3 results)  Recent Labs  07/29/14 1838 11/01/14 1920  BNP 280.0* 58.0    ProBNP (last 3 results)  Recent Labs  11/24/13 1110   PROBNP 292.3    CBG: No results for input(s): GLUCAP in the last 168 hours.     SignedRadene Gunning  [facility] 11/02/2014, 1:36 PM

## 2014-11-02 NOTE — Progress Notes (Signed)
Patient ambulated in hallway with no c/o chest pain,patient states" I felt a little short of breath". Vital signs stable, see Docflowsheet for vitals time 12:26. Will continue to monitor patient.

## 2014-11-02 NOTE — Progress Notes (Signed)
Patient was discharged this afternoon,with instructions given on medications,and follow up visits,patient verbalized understanding. Prescriptions given to patient. No c/o pain or discomfort. Accompanied by staff to an awaiting vehicle.

## 2014-11-02 NOTE — Care Management Note (Signed)
Case Management Note  Patient Details  Name: KATRESE PETILLO MRN: [id number] Date of Birth: 05-10-1932  Subjective/Objective:                  Pt admitted from home with CP. Pt lives with family and will return home at discharge. Pt has cane, walker, rollator, and BSC for home use. Pt is very independent with ADL's.  Action/Plan: No CM needs noted. Pt signed Medicare OBS notification form and form placed on chart.  Expected Discharge Date:   11/03/14               Expected Discharge Plan:  Home/Self Care  In-House Referral:  NA  Discharge planning Services  CM Consult  Post Acute Care Choice:  NA Choice offered to:  NA  DME Arranged:    DME Agency:     HH Arranged:    HH Agency:     Status of Service:  Completed, signed off  Medicare Important Message Given:    Date Medicare IM Given:    Medicare IM give by:    Date Additional Medicare IM Given:    Additional Medicare Important Message give by:     If discussed at [place] of Stay Meetings, dates discussed:    Additional Comments:  Joylene Draft, RN 11/02/2014, 10:44 AM

## 2014-11-02 NOTE — Plan of Care (Signed)
Problem: Phase I Progression Outcomes Goal: Hemodynamically stable Outcome: Progressing Pt's D Dimer was within normal limits. Diastolic blood pressure has been trending low, but all other vitals are within normal limits.  Goal: Anginal pain relieved Outcome: Progressing After coming to the medical unit, pt has not complained of anymore chest pain since last night.

## 2014-11-04 ENCOUNTER — Other Ambulatory Visit: Payer: Self-pay | Admitting: Cardiovascular Disease

## 2014-11-09 DIAGNOSIS — R7301 Impaired fasting glucose: Secondary | ICD-10-CM | POA: Diagnosis not present

## 2014-11-09 DIAGNOSIS — R079 Chest pain, unspecified: Secondary | ICD-10-CM | POA: Diagnosis not present

## 2014-11-10 ENCOUNTER — Ambulatory Visit (INDEPENDENT_AMBULATORY_CARE_PROVIDER_SITE_OTHER): Payer: Medicare Other | Admitting: Adult Health

## 2014-11-10 ENCOUNTER — Encounter: Payer: Self-pay | Admitting: Adult Health

## 2014-11-10 VITALS — BP 144/60 | HR 62 | Ht 61.0 in | Wt 179.0 lb

## 2014-11-10 DIAGNOSIS — I2510 Atherosclerotic heart disease of native coronary artery without angina pectoris: Secondary | ICD-10-CM | POA: Diagnosis not present

## 2014-11-10 DIAGNOSIS — I5033 Acute on chronic diastolic (congestive) heart failure: Secondary | ICD-10-CM | POA: Diagnosis not present

## 2014-11-10 NOTE — Progress Notes (Signed)
Cardiology Office Note   Date:  11/10/2014   ID:  [name], DOB 1932/10/03, MRN [id number]  PCP:  [name]  Cardiologist:  Woodroe Chen, NP   Chief Complaint  Patient presents with  . Coronary Artery Disease  . Hypertension  . Congestive Heart Failure    Diastolic      History of Present Illness: [name] is a 81 y.o. female who presents for ongoing assessment and management of coronary artery disease, status post PCI with drug-eluting stent to the left circumflex in January 2015, hypertension, hyperlipidemia, chronic diastolic heart failure, depression, and severe panic attacks.  Other history includes COPD with history of chronic dyspnea, which is multifactorial.  Chest is ongoing tobacco abuse.  The patient was last seen by Dr. Bronson Ing in March of 2016.  That time.  She continued to have shortness of breath.  The patient was admitted to the hospital on 10/31/2004 in the setting of recurrent chest pain, and diastolic CHF.  There was described as sharp and radiated to the back lasting approximate 1 minute. Assessment the patient's pain was musculoskeletal in etiology.  She was continued on home medication regimen.  She was treated for GERD and continue her PPI and to followup with PCP.  It was noted during hospitalization, the patient did have a echocardiogram in August of 2015, revealing normal LV systolic function with an EF of 65-70% with mild to moderate aortic regurg.  The patient also had a nuclear stress test in August of 2015, which was negative for ischemia.  She comes today with multiple somatic complaints of pain.  She describes pain in detail from her head to her feet.  She is medically compliant.  She is sedentary.  She does have a history of instability on her feet and some falls.  She has had a history of back surgery, but has not followed up with her surgeon, Dr. Joya Salm.    Past Medical History  Diagnosis Date  . PVD (peripheral  vascular disease)   . Other and unspecified hyperlipidemia   . Coronary atherosclerosis of native coronary artery     nonobstructive CAD by multiple catheterizations  . Panic disorder without agoraphobia   . HTN (hypertension)     Hx of it  . MI (myocardial infarction) 2006  . Internal hemorrhoids without mention of complication   . Dysphagia, unspecified(787.20)   . Microscopic colitis 2003  . GERD (gastroesophageal reflux disease)     Hx Schatzki's ring, multiple EGD/ED last 01/06/2004  . Hyperlipemia   . Thyroid disease     recent abnl TSH per pt  . S/P colonoscopy 09/27/2001    internal hemorrhoids, desc colon inflam polyp, SB BX-chronic duodenitis, colitis  . Arthritis   . Shortness of breath   . Heart disease   . Bursitis     left shoulder  . Hyperlipidemia   . Sleep disorder     obstructive  . COPD (chronic obstructive pulmonary disease)   . Complication of anesthesia   . PONV (postoperative nausea and vomiting)     'a little nausea"  . Anxiety   . Depression   . Pneumonia 12/2011  . Heart murmur     'a littel'  . Cataract   . Paresthesia     hands, feet    Past Surgical History  Procedure Laterality Date  . Tonsillectomy    . Unspecified area, hysterectomy  1972    partial  . Cholecystectomy  1998  .  Right knee replacement  2007  . Right leg benign tumor    . Breast lumpectomy  1998    left, benign  . Left hand surgery    . Left rotator cuff surgery    . Nasal sinus surgery    . Bladder tack  06/2010  . Maloney dilation  03/16/2011    Gastritis. No H.pylori on bx. 42F maloney dilation with disruption of  occult cevical esophageal web  . Colonoscopy  03/16/2011    multiple hyperplastic colon polyps, sigmoid diverticulosis, melanosis coli  . Bladder suspension  11/09/2011    Procedure: TRANSVAGINAL TAPE (TVT) PROCEDURE;  Surgeon: Marissa Nestle, MD;  Location: [facility];  Service: Urology;  Laterality: N/A;  . Abdominal hysterectomy    . Appendectomy     . Carpal tunnel release  1989    left  . Anterior and posterior repair      with resection of vagina  . Shoulder surgery Left   . Joint replacement Right 2007  . Lumbar laminectomy/decompression microdiscectomy N/A 10/11/2012    Procedure: LUMBAR LAMINECTOMY/DECOMPRESSION MICRODISCECTOMY 2 LEVELS;  Surgeon: Floyce Stakes, MD;  Location: [facility];  Service: Neurosurgery;  Laterality: N/A;  L3-4 L4-5 Laminectomy  . Left heart catheterization with coronary angiogram N/A 05/14/2013    Procedure: LEFT HEART CATHETERIZATION WITH CORONARY ANGIOGRAM;  Surgeon: Blane Ohara, MD;  Location: [facility];  Service: Cardiovascular;  Laterality: N/A;  . Cardiac catheterization    . Cardiac catheterization    . Coronary angioplasty with stent placement       Current Outpatient Prescriptions  Medication Sig Dispense Refill  . furosemide (LASIX) 40 MG tablet Take 40 mg by mouth daily.    [name] Kitchen ALPRAZolam (XANAX) 0.25 MG tablet Take 0.25 mg by mouth daily as needed for anxiety.     [name] Kitchen alum & mag hydroxide-simeth (MAALOX/MYLANTA) 200-200-20 MG/5ML suspension Take 30 mLs by mouth every 6 (six) hours as needed for indigestion or heartburn (dyspepsia). 355 mL 0  . amLODipine (NORVASC) 10 MG tablet Take 1 tablet (10 mg total) by mouth daily. 30 tablet 6  . aspirin 81 MG EC tablet Take 81 mg by mouth every morning.     . carvedilol (COREG) 12.5 MG tablet TAKE ONE TABLET BY MOUTH TWICE DAILY. 60 tablet 6  . CRESTOR 5 MG tablet Take 5 mg by mouth every morning.     . cyclobenzaprine (FLEXERIL) 10 MG tablet Take 10 mg by mouth 3 (three) times daily as needed for muscle spasms.    . DULoxetine (CYMBALTA) 60 MG capsule Take 60 mg by mouth at bedtime.     . fluticasone (VERAMYST) 27.5 MCG/SPRAY nasal spray Place 1 spray into the nose daily as needed for rhinitis or allergies.     . hydrocortisone (PROCTOSOL HC) 2.5 % rectal cream Place 1 application rectally 2 (two) times daily. For 14 days. 30 g 0  .  lubiprostone (AMITIZA) 24 MCG capsule Take 1 capsule (24 mcg total) by mouth 2 (two) times daily with a meal. 60 capsule 3  . nitroGLYCERIN (NITROSTAT) 0.4 MG SL tablet Place 0.4 mg under the tongue every 5 (five) minutes as needed for chest pain.    . pantoprazole (PROTONIX) 40 MG tablet Take 40 mg by mouth every morning.     . potassium chloride (K-DUR) 10 MEQ tablet Take 40 meq until 08/15/14 then 20 meq daily (Patient taking differently: Take 20 mEq by mouth daily. ) 84 tablet 3  .  QC LORATADINE ALLERGY RELIEF 10 MG tablet Take 10 mg by mouth daily.  4  . RANEXA 500 MG 12 hr tablet TAKE ONE TABLET BY MOUTH TWICE DAILY. 60 tablet 3  . traMADol (ULTRAM) 50 MG tablet Take 1 tablet (50 mg total) by mouth every 6 (six) hours as needed. (Patient not taking: Reported on 11/10/2014) 30 tablet 0   No current facility-administered medications for this visit.    Allergies:   Levaquin; Macrodantin; Phenothiazines; Polysorbate; Prednisone; Buspirone; Cardura; Codeine; Prochlorperazine; Atorvastatin; Ofloxacin; Other; Penicillins; and Pimozide    Social History:  The patient  reports that she quit smoking about 12 years ago. Her smoking use included Cigarettes. She started smoking about 66 years ago. She has a 64 pack-year smoking history. She has never used smokeless tobacco. She reports that she does not drink alcohol or use illicit drugs.   Family History:  The patient's family history includes Cancer in her other; Coronary artery disease in her other; Diabetes in her brother; Hypertension in her other; Stroke in her other.    ROS: .   All other systems are reviewed and negative.Unless otherwise mentioned in  H&P above.   PHYSICAL EXAM: VS:  BP 144/60 mmHg  Pulse 62  Ht 5\' 1"  (1.549 m)  Wt 179 lb (81.194 kg)  BMI 33.84 kg/m2  SpO2 95% , BMI Body mass index is 33.84 kg/(m^2). GEN: Well nourished, well developed, in no acute distress HEENT: normal Neck: no JVD, carotid bruits, or masses Cardiac:  RRR; no murmurs, rubs, or gallops,no edema  Respiratory:  clear to auscultation bilaterally, normal work of breathing GI: soft, nontender, nondistended, + BS MS: no deformity or atrophy Skin: warm and dry, no rash Neuro:  Strength and sensation are intact Psych: euthymic mood, full affect   Recent Labs: 11/24/2013: Magnesium 1.9; Pro B Natriuretic peptide (BNP) 292.3 11/25/2013: TSH 2.770 07/29/2014: ALT 14 11/01/2014: B Natriuretic Peptide 58.0; BUN 21*; Creatinine, Ser 1.17*; Hemoglobin 11.8*; Platelets 206; Potassium 3.6; Sodium 137    Lipid Panel    Component Value Date/Time   CHOL 181 11/24/2013 1110   TRIG 111 11/24/2013 1110   HDL 56 11/24/2013 1110   CHOLHDL 3.2 11/24/2013 1110   VLDL 22 11/24/2013 1110   LDLCALC 103* 11/24/2013 1110      Wt Readings from Last 3 Encounters:  11/10/14 179 lb (81.194 kg)  11/02/14 182 lb 15.7 oz (83 kg)  08/06/14 183 lb 6.4 oz (83.19 kg)      Other studies Reviewed: Additional studies/ records that were reviewed today include: None. Review of the above records demonstrates: N/A   ASSESSMENT AND PLAN:  1. CAD: history of stent to the left circumflex in January 2015.  The patient had followup nuclear stress test and varus of 2015 with no ischemia seen.  She continues to have complaints of pain.  It is constant.  Radiates from her neck to her shoulders and arms bilaterally.  She also complains of lower back pain and substernal pain.  I do not believe this to be related to cardiac etiology.  I will continue her on amlodipine, aspirin, carvedilol, and statin.  I have advised her to followup with her neurosurgeon concerning her back and chronic pain issues.  2. Chronic Diastolic CHF: No evidence of decompensation at this time.  Patient will continue current medication regimen.  She is not on a diuretic currently.  3.Chronic DOE: multifactorial in the setting of COPD, history of tobacco abuse, deconditioning, and obesity.  Current  medicines are reviewed at length with the patient today.    Labs/ tests ordered today include: None No orders of the defined types were placed in this encounter.     Disposition:   FU with 6 months.  Signed, Jory Sims, NP  11/10/2014 2:09 PM    [place] [address], [place], [place] [address] Phone: [phone]; Fax: [phone]

## 2014-11-10 NOTE — Progress Notes (Deleted)
Name: [name]    DOB: 21-May-1932  Age: 81 y.o.  MR#: [id number]       PCP:  [name]      Insurance: Payor: MEDICARE / Plan: MEDICARE PART A AND B / Product Type: *No Product type* /   CC:    Chief Complaint  Patient presents with  . Coronary Artery Disease  . Hypertension  . Congestive Heart Failure    Diastolic    VS Filed Vitals:   11/10/14 1351  BP: 144/60  Pulse: 62  Height: 5\' 1"  (1.549 m)  Weight: 179 lb (81.194 kg)  SpO2: 95%    Weights Current Weight  11/10/14 179 lb (81.194 kg)  11/02/14 182 lb 15.7 oz (83 kg)  08/06/14 183 lb 6.4 oz (83.19 kg)    Blood Pressure  BP Readings from Last 3 Encounters:  11/10/14 144/60  11/02/14 120/60  08/06/14 127/75     Admit date:  (Not on file) Last encounter with RMR:  Visit date not found   Allergy Levaquin; Macrodantin; Phenothiazines; Polysorbate; Prednisone; Buspirone; Cardura; Codeine; Prochlorperazine; Atorvastatin; Ofloxacin; Other; Penicillins; and Pimozide  Current Outpatient Prescriptions  Medication Sig Dispense Refill  . furosemide (LASIX) 40 MG tablet Take 40 mg by mouth daily.    [name] Kitchen ALPRAZolam (XANAX) 0.25 MG tablet Take 0.25 mg by mouth daily as needed for anxiety.     [name] Kitchen alum & mag hydroxide-simeth (MAALOX/MYLANTA) 200-200-20 MG/5ML suspension Take 30 mLs by mouth every 6 (six) hours as needed for indigestion or heartburn (dyspepsia). 355 mL 0  . amLODipine (NORVASC) 10 MG tablet Take 1 tablet (10 mg total) by mouth daily. 30 tablet 6  . aspirin 81 MG EC tablet Take 81 mg by mouth every morning.     . carvedilol (COREG) 12.5 MG tablet TAKE ONE TABLET BY MOUTH TWICE DAILY. 60 tablet 6  . CRESTOR 5 MG tablet Take 5 mg by mouth every morning.     . cyclobenzaprine (FLEXERIL) 10 MG tablet Take 10 mg by mouth 3 (three) times daily as needed for muscle spasms.    . DULoxetine (CYMBALTA) 60 MG capsule Take 60 mg by mouth at bedtime.     . fluticasone (VERAMYST) 27.5 MCG/SPRAY nasal spray Place 1  spray into the nose daily as needed for rhinitis or allergies.     . hydrocortisone (PROCTOSOL HC) 2.5 % rectal cream Place 1 application rectally 2 (two) times daily. For 14 days. 30 g 0  . lubiprostone (AMITIZA) 24 MCG capsule Take 1 capsule (24 mcg total) by mouth 2 (two) times daily with a meal. 60 capsule 3  . nitroGLYCERIN (NITROSTAT) 0.4 MG SL tablet Place 0.4 mg under the tongue every 5 (five) minutes as needed for chest pain.    . pantoprazole (PROTONIX) 40 MG tablet Take 40 mg by mouth every morning.     . potassium chloride (K-DUR) 10 MEQ tablet Take 40 meq until 08/15/14 then 20 meq daily (Patient taking differently: Take 20 mEq by mouth daily. ) 84 tablet 3  . QC LORATADINE ALLERGY RELIEF 10 MG tablet Take 10 mg by mouth daily.  4  . RANEXA 500 MG 12 hr tablet TAKE ONE TABLET BY MOUTH TWICE DAILY. 60 tablet 3  . traMADol (ULTRAM) 50 MG tablet Take 1 tablet (50 mg total) by mouth every 6 (six) hours as needed. (Patient not taking: Reported on 11/10/2014) 30 tablet 0   No current facility-administered medications for this visit.    Discontinued Meds:  Medications Discontinued During This Encounter  Medication Reason  . furosemide (LASIX) 40 MG tablet Error  . furosemide (LASIX) 40 MG tablet Error    Patient Active Problem List   Diagnosis Date Noted  . Pain in the chest   . Hyperlipidemia   . Weight gain 08/12/2014  . Normocytic anemia 08/07/2014  . Hemorrhoids 08/06/2014  . Diastolic CHF, acute on chronic 07/30/2014  . Dyspnea on exertion   . CHF (congestive heart failure) 07/29/2014  . Rectal bleeding 07/06/2014  . Constipation 07/06/2014  . Chest pain 11/24/2013  . Lower extremity edema 11/24/2013  . Acute kidney failure 11/24/2013  . Hypokalemia 11/24/2013  . Other and unspecified angina pectoris 05/14/2013  . Hematochezia 02/13/2011  . Abdominal pain 02/13/2011  . Major depression 09/28/2010  . FATIGUE 04/13/2009  . Chronic diastolic heart failure [id number]   . DYSPNEA 11/30/2008  . HYPERLIPIDEMIA-MIXED 11/27/2008  . CAD, NATIVE VESSEL - PCI + DES to left circumflex 05/14/13 11/27/2008  . Peripheral vascular disease 11/27/2008  . PANIC ATTACK 02/28/2008  . MI 02/28/2008  . Internal hemorrhoids 02/28/2008  . GASTROESOPHAGEAL REFLUX DISEASE, CHRONIC 02/28/2008  . COLITIS 02/28/2008  . DYSPHAGIA UNSPECIFIED 02/28/2008  . History of cardiovascular disorder 02/28/2008    LABS    Component Value Date/Time   NA 137 11/01/2014 1920   NA 139 07/31/2014 0716   NA 136 07/29/2014 1838   K 3.6 11/01/2014 1920   K 3.3* 07/31/2014 0716   K 3.8 07/29/2014 1838   CL 100* 11/01/2014 1920   CL 101 07/31/2014 0716   CL 102 07/29/2014 1838   CO2 28 11/01/2014 1920   CO2 28 07/31/2014 0716   CO2 24 07/29/2014 1838   GLUCOSE 102* 11/01/2014 1920   GLUCOSE 109* 07/31/2014 0716   GLUCOSE 104* 07/29/2014 1838   BUN 21* 11/01/2014 1920   BUN 16 07/31/2014 0716   BUN 18 07/29/2014 1838   CREATININE 1.17* 11/01/2014 2325   CREATININE 1.08* 11/01/2014 1920   CREATININE 1.12* 07/31/2014 0716   CREATININE 1.17* 02/13/2011 1526   CALCIUM 8.5* 11/01/2014 1920   CALCIUM 8.5 07/31/2014 0716   CALCIUM 8.5 07/29/2014 1838   GFRNONAA 43* 11/01/2014 2325   GFRNONAA 47* 11/01/2014 1920   GFRNONAA 45* 07/31/2014 0716   GFRAA 50* 11/01/2014 2325   GFRAA 55* 11/01/2014 1920   GFRAA 53* 07/31/2014 0716   CMP     Component Value Date/Time   NA 137 11/01/2014 1920   K 3.6 11/01/2014 1920   CL 100* 11/01/2014 1920   CO2 28 11/01/2014 1920   GLUCOSE 102* 11/01/2014 1920   BUN 21* 11/01/2014 1920   CREATININE 1.17* 11/01/2014 2325   CREATININE 1.17* 02/13/2011 1526   CALCIUM 8.5* 11/01/2014 1920   PROT 7.0 07/29/2014 1838   ALBUMIN 3.6 07/29/2014 1838   AST 16 07/29/2014 1838   ALT 14 07/29/2014 1838   ALKPHOS 71 07/29/2014 1838   BILITOT 0.6 07/29/2014 1838   GFRNONAA 43* 11/01/2014 2325   GFRAA 50* 11/01/2014 2325       Component Value Date/Time    WBC 6.6 11/01/2014 2325   WBC 6.1 11/01/2014 1920   WBC 6.3 07/31/2014 0716   HGB 11.8* 11/01/2014 2325   HGB 12.6 11/01/2014 1920   HGB 8.9* 07/31/2014 0716   HCT 34.2* 11/01/2014 2325   HCT 36.6 11/01/2014 1920   HCT 27.3* 07/31/2014 0716   MCV 87.2 11/01/2014 2325   MCV 86.9 11/01/2014 1920   MCV 81.7 07/31/2014  [id number]    Lipid Panel     Component Value Date/Time   CHOL 181 11/24/2013 1110   TRIG 111 11/24/2013 1110   HDL 56 11/24/2013 1110   CHOLHDL 3.2 11/24/2013 1110   VLDL 22 11/24/2013 1110   LDLCALC 103* 11/24/2013 1110    ABG No results found for: PHART, PCO2ART, PO2ART, HCO3, TCO2, ACIDBASEDEF, O2SAT   Lab Results  Component Value Date   TSH 2.770 11/25/2013   BNP (last 3 results)  Recent Labs  07/29/14 1838 11/01/14 1920  BNP 280.0* 58.0    ProBNP (last 3 results)  Recent Labs  11/24/13 1110  PROBNP 292.3    Cardiac Panel (last 3 results) No results for input(s): CKTOTAL, CKMB, TROPONINI, RELINDX in the last 72 hours.  Iron/TIBC/Ferritin/ %Sat No results found for: IRON, TIBC, FERRITIN, IRONPCTSAT   EKG Orders placed or performed during the hospital encounter of 11/01/14  . ED EKG  . ED EKG  . EKG 12-Lead  . EKG 12-Lead  . EKG 12-Lead (at 6am)  . EKG 12-Lead (at 6am)  . EKG     Prior Assessment and Plan Problem List as of 11/10/2014      Cardiovascular and Mediastinum   MI   CAD, NATIVE VESSEL - PCI + DES to left circumflex 05/14/13   Last Assessment & Plan 05/29/2013 Office Visit Written 05/29/2013  2:04 PM by Lendon Colonel, NP    She offers no complaints of recurrent chest pain. She is medically compliant. Refills on NTG are provided.       Chronic diastolic heart failure   Last Assessment & Plan 05/29/2013 Office Visit Written 05/29/2013  2:04 PM by Lendon Colonel, NP    No evidence of fluid retention by exam. She is stable on her weight.      Peripheral vascular disease   Last Assessment & Plan 09/28/2010 Office Visit  Written 09/28/2010  1:58 PM by Ezra Sites, MD    Patient reports no claudication.      Internal hemorrhoids   Last Assessment & Plan 07/06/2014 Office Visit Edited 07/06/2014  9:42 AM by Mahala Menghini, PA-C    Recent flare in hemorrhoids in setting of constipation and with rectal bleeding. Several illnesses lately and limited mobility like cause of constipation. Lat TCS 2012 as outlined under [place].   Start anusol HC suppositories bid for 12 days. miralax 1/2 to 1 capful daily to keep stool soft.  Patient not interested in [facility] hemorrhoid banding at this time. Return to the office in 3-4 weeks for follow up or call sooner if needed.       Other and unspecified angina pectoris   CHF (congestive heart failure)   Diastolic CHF, acute on chronic   Hemorrhoids   Last Assessment & Plan 08/06/2014 Office Visit Edited 08/07/2014  1:46 PM by Mahala Menghini, PA-C    Trial of Proctocort foam anorectally bid for 10 days. Call with PR in couple of weeks. Patient has tolerated hydrocortisone hemorrhoid creams before (allergy to prednisone).        Digestive   GASTROESOPHAGEAL REFLUX DISEASE, CHRONIC   Last Assessment & Plan 04/20/2011 Office Visit Written 04/20/2011  4:13 PM by Mahala Menghini, PA    Well controlled.      COLITIS   DYSPHAGIA UNSPECIFIED   Last Assessment & Plan 02/13/2011 Office Visit Written 02/14/2011  7:59 PM by Andria Meuse, NP    Hx esophageal dysphagia w/ hx Schatzki's ring &  esophageal motility disorder. May require repeat esophageal dilation.      Rectal bleeding   Constipation   Last Assessment & Plan 08/06/2014 Office Visit Written 08/07/2014  1:44 PM by Mahala Menghini, PA-C    Trial of Amitiza 35mcg BID with food. Stop Fiber and miralax which are likely contributing to her abdominal bloating/gas.        Genitourinary   Acute kidney failure     Other   HYPERLIPIDEMIA-MIXED   Last Assessment & Plan 08/16/2011 Office Visit Written 08/16/2011  2:57 PM by Donney Dice,  PA    Patient  has documented intolerance to statins, most recently Crestor. Followup labs drawn yesterday in Dr. Edythe Lynn office, to whom we will defer future monitoring/management. Aggressive  treatment recommended, however, with target LDL 70 or less, if feasible.       PANIC ATTACK   FATIGUE   DYSPNEA   Last Assessment & Plan 02/12/2014 Office Visit Edited 02/12/2014  8:15 PM by Tanda Rockers, MD    - 02/12/2014  Walked RA x 1 lap  @ 185 ft each stopped due to  Sob, no desat, moderate pace  - 02/12/2014 spirometry >   FEV1  1.41 (83%) ratio 83   Symptoms are markedly disproportionate to objective findings and not clear this is a lung problem but pt does appear to have difficult airway management issues. DDX of  difficult airways management all start with A and  include Adherence, Ace Inhibitors, Acid Reflux, Active Sinus Disease, Alpha 1 Antitripsin deficiency, Anxiety masquerading as Airways dz,  ABPA,  allergy(esp in young), Aspiration (esp in elderly), Adverse effects of DPI,  Active smokers, plus two Bs  = Bronchiectasis and Beta blocker use..and one C= CHF  ? Acid (or non-acid) GERD > always difficult to exclude as up to 75% of pts in some series report no assoc GI/ Heartburn symptoms> rec max (24h)  acid suppression and diet restrictions/ reviewed and instructions given in writing.   ? Anxiety/ depression/ deconditioning all playing a role but not easily corrected.   ? chf > note breathing was better p stopped smoking until cardiac issues and wt gain > already f/u by cards, not decompensated at present  No evidence of a pulmonary problem here so f/u can be prn.      History of cardiovascular disorder   Last Assessment & Plan 08/16/2011 Office Visit Written 08/16/2011  2:56 PM by Donney Dice, PA    Will defer to Dr. Edythe Lynn office for ongoing monitoring/management.      Major depression   Last Assessment & Plan 09/28/2010 Office Visit Written 09/28/2010  1:59 PM by Ezra Sites,  MD    The patient's depression is quite severe she has been on Cymbalta and takes alprazolam for her anxiety attacks. She is more depressed and has significant insomnia. I added trazodone 50 mg by mouth each bedtimeboth as a sleeping aid as well as augmentation therapy for her depression.      Hematochezia   Last Assessment & Plan 02/13/2011 Office Visit Edited 02/14/2011  8:03 PM by Andria Meuse, NP    This is concerning.  Will most likely need colonoscopy to determine etiology since last colonoscopy over 3 yrs ago after CT for further evaluation.      Abdominal pain   Last Assessment & Plan 05/30/2011 Office Visit Written 05/30/2011  3:44 PM by Daneil Dolin, MD    One-year history of right lower quadrant abdominal  pain. Pain unrelenting and insidiously worsening. It has a positional component. I doubt this is radicular pain. Likewise, I doubt this is musculoskeletal pain. It sounds visceral to me. Wonder about adhesions or an occult process i.e. spigelian hernia. She's had a thorough workup to date.  Recommendations: Proceed with a surgery consultation for consideration of diagnostic laparoscopy with for lyses of adhesions, etc. I've taken the liberty to get her scheduled see Dr. Aviva Signs as her former general surgeon, Dr. Lindalou Hose, is no longer in the area.      Chest pain   Lower extremity edema   Hypokalemia   Dyspnea on exertion   Normocytic anemia   Last Assessment & Plan 08/06/2014 Office Visit Written 08/07/2014  1:45 PM by Mahala Menghini, PA-C    Noted during recent admission. Will recheck at this time.       Weight gain   Pain in the chest   Hyperlipidemia       Imaging: Dg Chest Portable 1 View  11/01/2014   CLINICAL DATA:  Central chest pain for 2 days  EXAM: PORTABLE CHEST - 1 VIEW  COMPARISON:  07/29/2014  FINDINGS: The heart size and mediastinal contours are within normal limits. Both lungs are clear. The visualized skeletal structures are unremarkable.   IMPRESSION: No acute abnormality noted.   Electronically Signed   By: Inez Catalina M.D.   On: 11/01/2014 19:39

## 2014-11-10 NOTE — Patient Instructions (Signed)
Your physician wants you to follow-up in: 3 months with Dr. Bronson Ing. You will receive a reminder letter in the mail two months in advance. If you don't receive a letter, please call our office to schedule the follow-up appointment.  Your physician recommends that you continue on your current medications as directed. Please refer to the Current Medication list given to you today.  Thank you for choosing [facility]!

## 2014-11-12 DIAGNOSIS — M25561 Pain in right knee: Secondary | ICD-10-CM | POA: Diagnosis not present

## 2014-11-12 DIAGNOSIS — M25571 Pain in right ankle and joints of right foot: Secondary | ICD-10-CM | POA: Diagnosis not present

## 2014-11-12 DIAGNOSIS — M545 Low back pain: Secondary | ICD-10-CM | POA: Diagnosis not present

## 2014-11-12 DIAGNOSIS — M1990 Unspecified osteoarthritis, unspecified site: Secondary | ICD-10-CM | POA: Diagnosis not present

## 2014-11-12 DIAGNOSIS — M25512 Pain in left shoulder: Secondary | ICD-10-CM | POA: Diagnosis not present

## 2014-11-23 ENCOUNTER — Other Ambulatory Visit: Payer: Self-pay | Admitting: *Deleted

## 2014-11-23 DIAGNOSIS — I7092 Chronic total occlusion of artery of the extremities: Secondary | ICD-10-CM

## 2014-12-02 DIAGNOSIS — R079 Chest pain, unspecified: Secondary | ICD-10-CM | POA: Diagnosis not present

## 2014-12-25 ENCOUNTER — Encounter (HOSPITAL_COMMUNITY): Payer: Medicare Other

## 2014-12-25 ENCOUNTER — Encounter: Payer: Medicare Other | Admitting: Vascular Surgery

## 2015-01-07 ENCOUNTER — Other Ambulatory Visit: Payer: Self-pay | Admitting: Cardiovascular Disease

## 2015-01-15 ENCOUNTER — Encounter: Payer: Medicare Other | Admitting: Vascular Surgery

## 2015-01-15 ENCOUNTER — Encounter (HOSPITAL_COMMUNITY): Payer: Medicare Other

## 2015-01-27 ENCOUNTER — Other Ambulatory Visit: Payer: Self-pay | Admitting: Cardiovascular Disease

## 2015-02-08 ENCOUNTER — Other Ambulatory Visit: Payer: Self-pay | Admitting: *Deleted

## 2015-02-08 ENCOUNTER — Other Ambulatory Visit: Payer: Self-pay | Admitting: Cardiovascular Disease

## 2015-02-08 MED ORDER — RANOLAZINE ER 500 MG PO TB12: 500.0000 mg | ORAL_TABLET | Freq: Two times a day (BID) | ORAL | Status: DC

## 2015-02-11 ENCOUNTER — Other Ambulatory Visit: Payer: Self-pay | Admitting: Cardiovascular Disease

## 2015-02-17 ENCOUNTER — Ambulatory Visit (INDEPENDENT_AMBULATORY_CARE_PROVIDER_SITE_OTHER): Payer: Medicare Other | Admitting: Cardiovascular Disease

## 2015-02-17 ENCOUNTER — Encounter: Payer: Self-pay | Admitting: Cardiovascular Disease

## 2015-02-17 VITALS — BP 133/74 | HR 59 | Ht 61.0 in | Wt 179.0 lb

## 2015-02-17 DIAGNOSIS — I10 Essential (primary) hypertension: Secondary | ICD-10-CM | POA: Diagnosis not present

## 2015-02-17 DIAGNOSIS — I2510 Atherosclerotic heart disease of native coronary artery without angina pectoris: Secondary | ICD-10-CM

## 2015-02-17 DIAGNOSIS — E785 Hyperlipidemia, unspecified: Secondary | ICD-10-CM

## 2015-02-17 DIAGNOSIS — I5032 Chronic diastolic (congestive) heart failure: Secondary | ICD-10-CM

## 2015-02-17 DIAGNOSIS — R0602 Shortness of breath: Secondary | ICD-10-CM | POA: Diagnosis not present

## 2015-02-17 DIAGNOSIS — R079 Chest pain, unspecified: Secondary | ICD-10-CM | POA: Diagnosis not present

## 2015-02-17 DIAGNOSIS — Z23 Encounter for immunization: Secondary | ICD-10-CM | POA: Diagnosis not present

## 2015-02-17 DIAGNOSIS — R942 Abnormal results of pulmonary function studies: Secondary | ICD-10-CM

## 2015-02-17 DIAGNOSIS — I25118 Atherosclerotic heart disease of native coronary artery with other forms of angina pectoris: Secondary | ICD-10-CM

## 2015-02-17 DIAGNOSIS — I739 Peripheral vascular disease, unspecified: Secondary | ICD-10-CM

## 2015-02-17 MED ORDER — RANOLAZINE ER 1000 MG PO TB12: 1000.0000 mg | ORAL_TABLET | Freq: Two times a day (BID) | ORAL | Status: DC

## 2015-02-17 NOTE — Progress Notes (Signed)
Patient ID: KATTYA LOCKER, female   DOB: 1932/08/20, 81 y.o.   MRN: [id number]      SUBJECTIVE: Mrs. Schwinghammer underwent percutaneous coronary intervention with drug-eluting stent to the left circumflex coronary artery in January 2015. Her proximal LAD showed mild nonobstructive disease and she had moderately severe disease in a small, nondominant RCA. She also has a history of hypertension, hyperlipidemia, chronic diastolic heart failure, depression and severe panic attacks.   She has a h/o dyspnea which is multifactorial in etiology, but includes COPD due to a h/o tobacco abuse.  There was no ischemia seen on nuclear stress testing on 11/25/2013.  Echocardiogram in August demonstrated vigorous left ventricular systolic function, EF [id number], with mild to moderate aortic regurgitation.  Pulmonary function testing in May 2016 demonstrated moderate to severely reduced DLCO. I subsequently made a referral to pulmonary but she has yet to see them.  She has complaints of numbness in her hands and forearms as well as numbness in her legs up to her knees when she sits down. She reportedly underwent vascular testing at [facility] but these results are unavailable to me. She is scheduled to see vascular surgery in [place] later this month. Does have exertional chest tightness.    Review of Systems: As per "subjective", otherwise negative.  Allergies  Allergen Reactions  . Levaquin [Levofloxacin In D5w] Swelling  . Macrodantin [Nitrofurantoin Macrocrystal] Swelling  . Phenothiazines Anaphylaxis and Hives  . Polysorbate Anaphylaxis  . Prednisone Shortness Of Breath  . Buspirone Itching  . Cardura [Doxazosin Mesylate] Itching  . Codeine Itching  . Prochlorperazine Other (See Comments)    unknown  . Atorvastatin Hives    Cramping; tolerates Crestor ok  . Ofloxacin Rash  . Other Itching and Rash    "WOOL"= make skin look like it has been burned  . Penicillins Other (See Comments)   Causes redness all over.   . Pimozide Hives and Itching    Current Outpatient Prescriptions  Medication Sig Dispense Refill  . acetaminophen (TYLENOL) 500 MG tablet Take 500 mg by mouth every 6 (six) hours as needed.    . ALPRAZolam (XANAX) 0.25 MG tablet Take 0.25 mg by mouth 2 (two) times daily.     [name] Kitchen aspirin 81 MG EC tablet Take 81 mg by mouth every morning.     . carvedilol (COREG) 12.5 MG tablet TAKE ONE TABLET BY MOUTH TWICE DAILY. 60 tablet 5  . CRESTOR 5 MG tablet Take 5 mg by mouth every morning.     . DULoxetine (CYMBALTA) 60 MG capsule Take 60 mg by mouth at bedtime.     . fluticasone (FLONASE) 50 MCG/ACT nasal spray Place 2 sprays into both nostrils daily.    . furosemide (LASIX) 40 MG tablet Take 40 mg by mouth daily.    [name] Kitchen loperamide (IMODIUM A-D) 2 MG tablet Take 2 mg by mouth 4 (four) times daily as needed for diarrhea or loose stools.    . nitroGLYCERIN (NITROSTAT) 0.4 MG SL tablet Place 0.4 mg under the tongue every 5 (five) minutes as needed for chest pain.    . pantoprazole (PROTONIX) 40 MG tablet Take 40 mg by mouth every morning.     . potassium chloride (K-DUR,KLOR-CON) 10 MEQ tablet Take 2 tablets (20 mEq total) by mouth daily. 60 tablet 3  . ranolazine (RANEXA) 500 MG 12 hr tablet Take 1 tablet (500 mg total) by mouth 2 (two) times daily. 60 tablet 3  . traMADol (ULTRAM) 50 MG tablet  Take 50 mg by mouth 2 (two) times daily as needed.     No current facility-administered medications for this visit.    Past Medical History  Diagnosis Date  . PVD (peripheral vascular disease) ([place])   . Other and unspecified hyperlipidemia   . Coronary atherosclerosis of native coronary artery     nonobstructive CAD by multiple catheterizations  . Panic disorder without agoraphobia   . HTN (hypertension)     Hx of it  . MI (myocardial infarction) ([place]) 2006  . Internal hemorrhoids without mention of complication   . Dysphagia, unspecified(787.20)   . Microscopic colitis 2003    . GERD (gastroesophageal reflux disease)     Hx Schatzki's ring, multiple EGD/ED last 01/06/2004  . Hyperlipemia   . Thyroid disease     recent abnl TSH per pt  . S/P colonoscopy 09/27/2001    internal hemorrhoids, desc colon inflam polyp, SB BX-chronic duodenitis, colitis  . Arthritis   . Shortness of breath   . Heart disease   . Bursitis     left shoulder  . Hyperlipidemia   . Sleep disorder     obstructive  . COPD (chronic obstructive pulmonary disease) ([place])   . Complication of anesthesia   . PONV (postoperative nausea and vomiting)     'a little nausea"  . Anxiety   . Depression   . Pneumonia 12/2011  . Heart murmur     'a littel'  . Cataract   . Paresthesia     hands, feet    Past Surgical History  Procedure Laterality Date  . Tonsillectomy    . Unspecified area, hysterectomy  1972    partial  . Cholecystectomy  1998  . Right knee replacement  2007  . Right leg benign tumor    . Breast lumpectomy  1998    left, benign  . Left hand surgery    . Left rotator cuff surgery    . Nasal sinus surgery    . Bladder tack  06/2010  . Maloney dilation  03/16/2011    Gastritis. No H.pylori on bx. 63F maloney dilation with disruption of  occult cevical esophageal web  . Colonoscopy  03/16/2011    multiple hyperplastic colon polyps, sigmoid diverticulosis, melanosis coli  . Bladder suspension  11/09/2011    Procedure: TRANSVAGINAL TAPE (TVT) PROCEDURE;  Surgeon: Marissa Nestle, MD;  Location: [facility];  Service: Urology;  Laterality: N/A;  . Abdominal hysterectomy    . Appendectomy    . Carpal tunnel release  1989    left  . Anterior and posterior repair      with resection of vagina  . Shoulder surgery Left   . Joint replacement Right 2007  . Lumbar laminectomy/decompression microdiscectomy N/A 10/11/2012    Procedure: LUMBAR LAMINECTOMY/DECOMPRESSION MICRODISCECTOMY 2 LEVELS;  Surgeon: Floyce Stakes, MD;  Location: [place] [facility]URO ORS;  Service: Neurosurgery;  Laterality:  N/A;  L3-4 L4-5 Laminectomy  . Left heart catheterization with coronary angiogram N/A 05/14/2013    Procedure: LEFT HEART CATHETERIZATION WITH CORONARY ANGIOGRAM;  Surgeon: Blane Ohara, MD;  Location: [facility] CATH LAB;  Service: Cardiovascular;  Laterality: N/A;  . Cardiac catheterization    . Cardiac catheterization    . Coronary angioplasty with stent placement      Social History   Social History  . Marital Status: Divorced    Spouse Name: N/A  . Number of Children: 5  . Years of Education: N/A   Occupational History  .  retired    Social History Main Topics  . Smoking status: Former Smoker -- 1.00 packs/day for 64 years    Types: Cigarettes    Start date: 12/24/1947    Quit date: 11/17/2001  . Smokeless tobacco: Never Used     Comment: Quit smoking in 2003  . Alcohol Use: No  . Drug Use: No  . Sexual Activity: No   Other Topics Concern  . Not on file   Social History Narrative   Divorced.   Sister had colon perforation & died from complications in [place], [place] Vitals:   02/17/15 1109  BP: 133/74  Pulse: 59  Height: 5\' 1"  (1.549 m)  Weight: 179 lb (81.194 kg)    PHYSICAL EXAM General: NAD HEENT: Normal. Neck: No JVD, no thyromegaly. Lungs: Clear to auscultation bilaterally with normal respiratory effort. CV: Nondisplaced PMI.  Regular rate and rhythm, normal S1/S2, no S3/S4, no murmur. No pretibial or periankle edema.   Abdomen: Soft, obese.  Neurologic: Alert and oriented x 3.  Psych: Normal affect. Skin: Normal. Musculoskeletal: No gross deformities. Extremities: No clubbing or cyanosis.   ECG: Most recent ECG reviewed.      ASSESSMENT AND PLAN: 1. CAD with DES to LCx: Given exertional chest tightness, will increase Ranexa to 1000 mg bid. Continue ASA. She is intolerant of statins, but is now on Crestor. Normal nuclear MPI study in 2015.  2. Essential HTN: Controlled. No changes.  3. Chronic diastolic heart failure: Optimal BP control is  of prime importance. No longer on amlodipine (unclear as to why). I am continuing Lasix 20 mg daily.   4. Hyperlipidemia: Reportedly intolerant to statin therapy, but now on low-dose Crestor. LDL 103 on 11/24/13. Will obtain copy of lipids from PCP.  5. Aortic regurgitation: Will monitor clinically and with surveillance echocardiograms.  6. DOE: Will make another pulmonary referral. PFT's noted above.  7. 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  8. PVD: Will obtain copy of diagnostic testing from [facility]. Scheduled to see vascular later this month.  Dispo: f/u 1 year.  Kate Sable, M.D., F.A.C.C.

## 2015-02-17 NOTE — Patient Instructions (Addendum)
   Increase Ranexa to 1,000mg  twice a day  - may take 2 tabs of your 500mg  twice a day till finish current supply.  New 1,000mg  tab sent to [facility] today.  Continue all other medications.   Pulmonology follow up with Dr. Luan Pulling  Your physician wants you to follow up in:  1 year.  You will receive a reminder letter in the mail one-two months in advance.  If you don't receive a letter, please call our office to schedule the follow up appointment

## 2015-02-21 ENCOUNTER — Emergency Department (HOSPITAL_COMMUNITY)
Admission: EM | Admit: 2015-02-21 | Discharge: 2015-02-21 | Disposition: A | Discharge status: Home / Self Care | Payer: Medicare Other | Attending: Emergency Medicine | Admitting: Emergency Medicine

## 2015-02-21 ENCOUNTER — Encounter (HOSPITAL_COMMUNITY): Payer: Self-pay | Admitting: Emergency Medicine

## 2015-02-21 ENCOUNTER — Emergency Department (HOSPITAL_COMMUNITY): Payer: Medicare Other

## 2015-02-21 DIAGNOSIS — Z87891 Personal history of nicotine dependence: Secondary | ICD-10-CM | POA: Insufficient documentation

## 2015-02-21 DIAGNOSIS — R0602 Shortness of breath: Secondary | ICD-10-CM | POA: Diagnosis not present

## 2015-02-21 DIAGNOSIS — Z8669 Personal history of other diseases of the nervous system and sense organs: Secondary | ICD-10-CM | POA: Insufficient documentation

## 2015-02-21 DIAGNOSIS — I252 Old myocardial infarction: Secondary | ICD-10-CM | POA: Diagnosis not present

## 2015-02-21 DIAGNOSIS — F410 Panic disorder [episodic paroxysmal anxiety] without agoraphobia: Secondary | ICD-10-CM | POA: Diagnosis not present

## 2015-02-21 DIAGNOSIS — J449 Chronic obstructive pulmonary disease, unspecified: Secondary | ICD-10-CM | POA: Diagnosis not present

## 2015-02-21 DIAGNOSIS — Z9861 Coronary angioplasty status: Secondary | ICD-10-CM | POA: Diagnosis not present

## 2015-02-21 DIAGNOSIS — R079 Chest pain, unspecified: Secondary | ICD-10-CM | POA: Diagnosis not present

## 2015-02-21 DIAGNOSIS — Z8701 Personal history of pneumonia (recurrent): Secondary | ICD-10-CM | POA: Diagnosis not present

## 2015-02-21 DIAGNOSIS — E785 Hyperlipidemia, unspecified: Secondary | ICD-10-CM | POA: Insufficient documentation

## 2015-02-21 DIAGNOSIS — I2510 Atherosclerotic heart disease of native coronary artery without angina pectoris: Secondary | ICD-10-CM | POA: Diagnosis not present

## 2015-02-21 DIAGNOSIS — Z880 Allergy status to penicillin: Secondary | ICD-10-CM | POA: Diagnosis not present

## 2015-02-21 DIAGNOSIS — Z79899 Other long term (current) drug therapy: Secondary | ICD-10-CM | POA: Insufficient documentation

## 2015-02-21 DIAGNOSIS — F329 Major depressive disorder, single episode, unspecified: Secondary | ICD-10-CM | POA: Insufficient documentation

## 2015-02-21 DIAGNOSIS — K219 Gastro-esophageal reflux disease without esophagitis: Secondary | ICD-10-CM | POA: Diagnosis not present

## 2015-02-21 DIAGNOSIS — R011 Cardiac murmur, unspecified: Secondary | ICD-10-CM | POA: Diagnosis not present

## 2015-02-21 DIAGNOSIS — M1990 Unspecified osteoarthritis, unspecified site: Secondary | ICD-10-CM | POA: Insufficient documentation

## 2015-02-21 DIAGNOSIS — Z7982 Long term (current) use of aspirin: Secondary | ICD-10-CM | POA: Insufficient documentation

## 2015-02-21 DIAGNOSIS — I959 Hypotension, unspecified: Secondary | ICD-10-CM | POA: Diagnosis not present

## 2015-02-21 DIAGNOSIS — I10 Essential (primary) hypertension: Secondary | ICD-10-CM | POA: Insufficient documentation

## 2015-02-21 DIAGNOSIS — Z7951 Long term (current) use of inhaled steroids: Secondary | ICD-10-CM | POA: Insufficient documentation

## 2015-02-21 LAB — CBC WITH DIFFERENTIAL/PLATELET
BASOS PCT: 0 %
Basophils Absolute: 0 10*3/uL (ref 0.0–0.1)
EOS ABS: 0.2 10*3/uL (ref 0.0–0.7)
EOS PCT: 3 %
HCT: 36.9 % (ref 36.0–46.0)
HEMOGLOBIN: 12.9 g/dL (ref 12.0–15.0)
LYMPHS ABS: 1.9 10*3/uL (ref 0.7–4.0)
Lymphocytes Relative: 27 %
MCH: 32.3 pg (ref 26.0–34.0)
MCHC: 35 g/dL (ref 30.0–36.0)
MCV: 92.3 fL (ref 78.0–100.0)
MONO ABS: 0.5 10*3/uL (ref 0.1–1.0)
MONOS PCT: 7 %
Neutro Abs: 4.3 10*3/uL (ref 1.7–7.7)
Neutrophils Relative %: 63 %
PLATELETS: 214 10*3/uL (ref 150–400)
RBC: 4 MIL/uL (ref 3.87–5.11)
RDW: 12.7 % (ref 11.5–15.5)
WBC: 6.8 10*3/uL (ref 4.0–10.5)

## 2015-02-21 LAB — URINALYSIS, ROUTINE W REFLEX MICROSCOPIC
Bilirubin Urine: NEGATIVE
Glucose, UA: NEGATIVE mg/dL
HGB URINE DIPSTICK: NEGATIVE
Ketones, ur: NEGATIVE mg/dL
LEUKOCYTES UA: NEGATIVE
NITRITE: NEGATIVE
PROTEIN: NEGATIVE mg/dL
SPECIFIC GRAVITY, URINE: 1.015 (ref 1.005–1.030)
UROBILINOGEN UA: 0.2 mg/dL (ref 0.0–1.0)
pH: 5.5 (ref 5.0–8.0)

## 2015-02-21 LAB — COMPREHENSIVE METABOLIC PANEL
ALBUMIN: 3.8 g/dL (ref 3.5–5.0)
ALK PHOS: 57 U/L (ref 38–126)
ALT: 14 U/L (ref 14–54)
ANION GAP: 8 (ref 5–15)
AST: 19 U/L (ref 15–41)
BUN: 20 mg/dL (ref 6–20)
CALCIUM: 8.5 mg/dL — AB (ref 8.9–10.3)
CHLORIDE: 99 mmol/L — AB (ref 101–111)
CO2: 28 mmol/L (ref 22–32)
Creatinine, Ser: 1.19 mg/dL — ABNORMAL HIGH (ref 0.44–1.00)
GFR calc Af Amer: 49 mL/min — ABNORMAL LOW (ref >60)
GFR calc non Af Amer: 42 mL/min — ABNORMAL LOW (ref >60)
GLUCOSE: 168 mg/dL — AB (ref 65–99)
Potassium: 3.2 mmol/L — ABNORMAL LOW (ref 3.5–5.1)
SODIUM: 135 mmol/L (ref 135–145)
Total Bilirubin: 0.5 mg/dL (ref 0.3–1.2)
Total Protein: 6.8 g/dL (ref 6.5–8.1)

## 2015-02-21 LAB — TROPONIN I: Troponin I: <0.03 ng/mL (ref <0.031)

## 2015-02-21 MED ORDER — POTASSIUM CHLORIDE CRYS ER 20 MEQ PO TBCR
20.0000 meq | EXTENDED_RELEASE_TABLET | Freq: Once | ORAL | Status: AC
  Administered 2015-02-21: 20 meq via ORAL
  Filled 2015-02-21: qty 1

## 2015-02-21 NOTE — ED Provider Notes (Addendum)
CSN: [id number]     Arrival date & time 02/21/15  1625 History   First MD Initiated Contact with Patient 02/21/15 1629     Chief Complaint  Patient presents with  . Chest Pain     (Consider location/radiation/quality/duration/timing/severity/associated sxs/prior Treatment) HPI..... Vague shakiness since increasing her Ranexa SR from 500 to 1000 mg qd on Wednesday. Also patient complains of chest pain. No dyspnea, nausea, diaphoresis. Patient has multiple health problems well documented in past medical history. No fever, sweats, chills, dysuria.  Past Medical History  Diagnosis Date  . PVD (peripheral vascular disease) ([place])   . Other and unspecified hyperlipidemia   . Coronary atherosclerosis of native coronary artery     nonobstructive CAD by multiple catheterizations  . Panic disorder without agoraphobia   . HTN (hypertension)     Hx of it  . MI (myocardial infarction) ([place]) 2006  . Internal hemorrhoids without mention of complication   . Dysphagia, unspecified(787.20)   . Microscopic colitis 2003  . GERD (gastroesophageal reflux disease)     Hx Schatzki's ring, multiple EGD/ED last 01/06/2004  . Hyperlipemia   . Thyroid disease     recent abnl TSH per pt  . S/P colonoscopy 09/27/2001    internal hemorrhoids, desc colon inflam polyp, SB BX-chronic duodenitis, colitis  . Arthritis   . Shortness of breath   . Heart disease   . Bursitis     left shoulder  . Hyperlipidemia   . Sleep disorder     obstructive  . COPD (chronic obstructive pulmonary disease) ([place])   . Complication of anesthesia   . PONV (postoperative nausea and vomiting)     'a little nausea"  . Anxiety   . Depression   . Pneumonia 12/2011  . Heart murmur     'a littel'  . Cataract   . Paresthesia     hands, feet   Past Surgical History  Procedure Laterality Date  . Tonsillectomy    . Unspecified area, hysterectomy  1972    partial  . Cholecystectomy  1998  . Right knee replacement  2007  . Right  leg benign tumor    . Breast lumpectomy  1998    left, benign  . Left hand surgery    . Left rotator cuff surgery    . Nasal sinus surgery    . Bladder tack  06/2010  . Maloney dilation  03/16/2011    Gastritis. No H.pylori on bx. 47F maloney dilation with disruption of  occult cevical esophageal web  . Colonoscopy  03/16/2011    multiple hyperplastic colon polyps, sigmoid diverticulosis, melanosis coli  . Bladder suspension  11/09/2011    Procedure: TRANSVAGINAL TAPE (TVT) PROCEDURE;  Surgeon: Marissa Nestle, MD;  Location: [facility];  Service: Urology;  Laterality: N/A;  . Abdominal hysterectomy    . Appendectomy    . Carpal tunnel release  1989    left  . Anterior and posterior repair      with resection of vagina  . Shoulder surgery Left   . Joint replacement Right 2007  . Lumbar laminectomy/decompression microdiscectomy N/A 10/11/2012    Procedure: LUMBAR LAMINECTOMY/DECOMPRESSION MICRODISCECTOMY 2 LEVELS;  Surgeon: Floyce Stakes, MD;  Location: [facility];  Service: Neurosurgery;  Laterality: N/A;  L3-4 L4-5 Laminectomy  . Left heart catheterization with coronary angiogram N/A 05/14/2013    Procedure: LEFT HEART CATHETERIZATION WITH CORONARY ANGIOGRAM;  Surgeon: Blane Ohara, MD;  Location: [facility];  Service: Cardiovascular;  Laterality: N/A;  . Cardiac catheterization    . Cardiac catheterization    . Coronary angioplasty with stent placement     Family History  Problem Relation Age of Onset  . Coronary artery disease Other     family Hx-sons  . Cancer Other   . Stroke Other     family Hx  . Hypertension Other     family Hx  . Diabetes Brother    Social History  Substance Use Topics  . Smoking status: Former Smoker -- 1.00 packs/day for 64 years    Types: Cigarettes    Start date: 12/24/1947    Quit date: 11/17/2001  . Smokeless tobacco: Never Used     Comment: Quit smoking in 2003  . Alcohol Use: No   OB History    Gravida Para Term Preterm AB TAB  SAB Ectopic Multiple Living   8 6   2     5      Review of Systems  All other systems reviewed and are negative.     Allergies  Levaquin; Macrodantin; Phenothiazines; Polysorbate; Prednisone; Buspirone; Cardura; Codeine; Prochlorperazine; Atorvastatin; Ofloxacin; Other; Penicillins; and Pimozide  Home Medications   Prior to Admission medications   Medication Sig Start Date End Date Taking? Authorizing Provider  acetaminophen (TYLENOL) 500 MG tablet Take 500 mg by mouth every 6 (six) hours as needed for mild pain.    Yes Historical Provider, MD  ALPRAZolam (XANAX) 0.25 MG tablet Take 0.25 mg by mouth 2 (two) times daily as needed for anxiety.    Yes Historical Provider, MD  aspirin 81 MG EC tablet Take 81 mg by mouth every morning.    Yes Historical Provider, MD  carvedilol (COREG) 12.5 MG tablet TAKE ONE TABLET BY MOUTH TWICE DAILY. 02/12/15  Yes Herminio Commons, MD  CRESTOR 5 MG tablet Take 5 mg by mouth every morning.  10/24/13  Yes Historical Provider, MD  DULoxetine (CYMBALTA) 60 MG capsule Take 60 mg by mouth at bedtime.    Yes Historical Provider, MD  fluticasone (FLONASE) 50 MCG/ACT nasal spray Place 2 sprays into both nostrils daily.   Yes Historical Provider, MD  furosemide (LASIX) 40 MG tablet Take 40 mg by mouth daily.   Yes Historical Provider, MD  loperamide (IMODIUM A-D) 2 MG tablet Take 2 mg by mouth 4 (four) times daily as needed for diarrhea or loose stools.   Yes Historical Provider, MD  nitroGLYCERIN (NITROSTAT) 0.4 MG SL tablet Place 0.4 mg under the tongue every 5 (five) minutes as needed for chest pain.   Yes Historical Provider, MD  pantoprazole (PROTONIX) 40 MG tablet Take 40 mg by mouth every morning.    Yes Historical Provider, MD  potassium chloride (K-DUR,KLOR-CON) 10 MEQ tablet Take 2 tablets (20 mEq total) by mouth daily. 01/27/15  Yes Herminio Commons, MD  ranolazine (RANEXA) 1000 MG SR tablet Take 1 tablet (1,000 mg total) by mouth 2 (two) times  daily. 02/17/15  Yes Herminio Commons, MD  traMADol (ULTRAM) 50 MG tablet Take 50 mg by mouth 2 (two) times daily as needed.   Yes Historical Provider, MD   BP 154/65 mmHg  Pulse 64  Temp(Src) 97.6 F (36.4 C) (Oral)  Resp 18  Ht 5\' 2"  (1.575 m)  Wt 179 lb (81.194 kg)  BMI 32.73 kg/m2  SpO2 100% Physical Exam  Constitutional: She is oriented to person, place, and time. She appears well-developed and well-nourished.  HENT:  Head: Normocephalic and atraumatic.  Eyes: Conjunctivae and EOM are normal. Pupils are equal, round, and reactive to light.  Neck: Normal range of motion. Neck supple.  Cardiovascular: Normal rate and regular rhythm.   Pulmonary/Chest: Effort normal and breath sounds normal.  Abdominal: Soft. Bowel sounds are normal.  Musculoskeletal: Normal range of motion.  Neurological: She is alert and oriented to person, place, and time.  Skin: Skin is warm and dry.  Psychiatric: She has a normal mood and affect. Her behavior is normal.  Nursing note and vitals reviewed.   ED Course  Procedures (including critical care time) Labs Review Labs Reviewed  COMPREHENSIVE METABOLIC PANEL - Abnormal; Notable for the following:    Potassium 3.2 (*)    Chloride 99 (*)    Glucose, Bld 168 (*)    Creatinine, Ser 1.19 (*)    Calcium 8.5 (*)    GFR calc non Af Amer 42 (*)    GFR calc Af Amer 49 (*)    All other components within normal limits  CBC WITH DIFFERENTIAL/PLATELET  TROPONIN I  URINALYSIS, ROUTINE W REFLEX MICROSCOPIC (NOT AT [facility])    Imaging Review Dg Chest Portable 1 View  02/21/2015  CLINICAL DATA:  Left-sided chest pain. Intermittent shortness of breath. EXAM: PORTABLE CHEST 1 VIEW COMPARISON:  11/01/2014 FINDINGS: Heart size and pulmonary vascularity are normal and the lungs are clear. Calcification in the thoracic aorta. IMPRESSION: No acute abnormalities.  Aortic atherosclerosis. Electronically Signed   By: Lorriane Shire M.D.   On: 02/21/2015 17:07   I  have personally reviewed and evaluated these images and lab results as part of my medical decision-making.   EKG Interpretation   Date/Time:  Sunday February 21 2015 16:29:31 EST Ventricular Rate:  60 PR Interval:  126 QRS Duration: 83 QT Interval:  493 QTC Calculation: 493 R Axis:   -17 Text Interpretation:  Sinus rhythm Borderline left axis deviation Low  voltage, precordial leads Borderline prolonged QT interval Confirmed by  Lacinda Axon  MD, Antania Hoefling ([id number]) on 02/21/2015 4:55:24 PM      MDM   Final diagnoses:  Chest pain, unspecified chest pain type    Patient appears to be in no acute distress. Screening EKG, troponin, labs, chest x-ray essentially normal.  She attributes her symptoms to the increase in her medication Ranexa. I recommended she go back to original dose and follow-up with her primary care doctor.  Patient rechecked at 2245. She is feeling much better. No chest pain or dyspnea.  Nat Christen, MD 02/21/15 2317  Nat Christen, MD 02/21/15 [phone]

## 2015-02-21 NOTE — Discharge Instructions (Signed)
Reduce the dose of Ranexa SR to 500 mg.   Follow-up your Dr. early in the week or return here if worse

## 2015-02-21 NOTE — ED Notes (Signed)
Patient brought in via EMS from home. Airway patent. Alert and oriented. Patient c/o left chest pain that radiates into back. Per patient intermittent with shortness of breath, nausea, and light headedness. Patient does have hx of stent and angina. Patient reports increase in Ranexa from 500mg  to 1000mg  a day on Wednesday. Patient normal sinus with no abnormalities on EKG per EMS. Patient was given 1 SL nitro in route to hospital by EMS which helped pain but dropped blood pressure to 92/57. Patient blood pressure now  181/73. Patient denies any pain at this time but states pain has been 7/10 intermittently.

## 2015-02-22 ENCOUNTER — Encounter: Payer: Self-pay | Admitting: *Deleted

## 2015-03-02 ENCOUNTER — Encounter: Payer: Self-pay | Admitting: Vascular Surgery

## 2015-03-04 DIAGNOSIS — H6522 Chronic serous otitis media, left ear: Secondary | ICD-10-CM | POA: Diagnosis not present

## 2015-03-05 ENCOUNTER — Ambulatory Visit (INDEPENDENT_AMBULATORY_CARE_PROVIDER_SITE_OTHER): Payer: Medicare Other | Admitting: Vascular Surgery

## 2015-03-05 ENCOUNTER — Ambulatory Visit (HOSPITAL_COMMUNITY)
Admission: RE | Admit: 2015-03-05 | Discharge: 2015-03-05 | Disposition: A | Discharge status: Home / Self Care | Payer: Medicare Other | Source: Ambulatory Visit | Attending: Vascular Surgery | Admitting: Vascular Surgery

## 2015-03-05 ENCOUNTER — Ambulatory Visit (INDEPENDENT_AMBULATORY_CARE_PROVIDER_SITE_OTHER)
Admission: RE | Admit: 2015-03-05 | Discharge: 2015-03-05 | Disposition: A | Discharge status: Home / Self Care | Payer: Medicare Other | Source: Ambulatory Visit | Attending: Vascular Surgery | Admitting: Vascular Surgery

## 2015-03-05 ENCOUNTER — Encounter: Payer: Self-pay | Admitting: Vascular Surgery

## 2015-03-05 VITALS — BP 160/55 | HR 57 | Ht 62.0 in | Wt 177.5 lb

## 2015-03-05 DIAGNOSIS — I779 Disorder of arteries and arterioles, unspecified: Secondary | ICD-10-CM | POA: Diagnosis not present

## 2015-03-05 DIAGNOSIS — I7092 Chronic total occlusion of artery of the extremities: Secondary | ICD-10-CM | POA: Diagnosis not present

## 2015-03-05 DIAGNOSIS — I2510 Atherosclerotic heart disease of native coronary artery without angina pectoris: Secondary | ICD-10-CM | POA: Diagnosis not present

## 2015-03-05 NOTE — Progress Notes (Signed)
[facility] [place]  Patient name: [name] MRN: [id number] DOB: 1932/08/28 Sex: female  REASON FOR CONSULT: numbness, pain and weakness in legs  HPI: [name] is a 81 y.o. female, who presents for evaluation of bilateral lower extremity pain, weakness and numbness. She reports a "burning, shooting pain" in her hip that radiates down to her feet. She also has similar pain down the back of her legs. This has been going on "for years." This pain can happen at any time and is associated at rest and with walking. She also reports numbness and tingling of her feet which started about a year ago. The patient also reports weakness with walking, saying her knees buckle. She has had prior right knee surgery. She can ambulate about 50 yards with a walker before having to stop secondary to shortness of breath. She is a former smoker. She denies any nonhealing wounds or rest pain. She does have a history of lower back pain and sciatica and has previously undergone spine surgery.   She has CAD s/p PCI and DES to left circumflex in January 2015. She is followed by Dr. Bronson Ing who she last saw on 02/17/15. She reports intermittent substernal pain and dyspnea lying flat and with exertion. She is a former smoker quitting in 2003. Her PMH also includes hypertension and COPD. She is not diabetic.   Past Medical History  Diagnosis Date  . PVD (peripheral vascular disease) ([place])   . Other and unspecified hyperlipidemia   . Coronary atherosclerosis of native coronary artery     nonobstructive CAD by multiple catheterizations  . Panic disorder without agoraphobia   . HTN (hypertension)     Hx of it  . MI (myocardial infarction) ([place]) 2006  . Internal hemorrhoids without mention of complication   . Dysphagia, unspecified(787.20)   . Microscopic colitis 2003  . GERD (gastroesophageal reflux disease)     Hx Schatzki's ring, multiple EGD/ED last 01/06/2004  . Hyperlipemia   .  Thyroid disease     recent abnl TSH per pt  . S/P colonoscopy 09/27/2001    internal hemorrhoids, desc colon inflam polyp, SB BX-chronic duodenitis, colitis  . Arthritis   . Shortness of breath   . Heart disease   . Bursitis     left shoulder  . Hyperlipidemia   . Sleep disorder     obstructive  . COPD (chronic obstructive pulmonary disease) ([place])   . Complication of anesthesia   . PONV (postoperative nausea and vomiting)     'a little nausea"  . Anxiety   . Depression   . Pneumonia 12/2011  . Heart murmur     'a littel'  . Cataract   . Paresthesia     hands, feet  . CHF (congestive heart failure) ([facility])     Family History  Problem Relation Age of Onset  . Coronary artery disease Other     family Hx-sons  . Cancer Other   . Stroke Other     family Hx  . Hypertension Other     family Hx  . Diabetes Brother   . Heart disease Son     before age 44    SOCIAL HISTORY: Social History   Social History  . Marital Status: Divorced    Spouse Name: N/A  . Number of Children: 5  . Years of Education: N/A   Occupational History  . retired    Social History Main Topics  . Smoking  status: Former Smoker -- 1.00 packs/day for 64 years    Types: Cigarettes    Start date: 12/24/1947    Quit date: 11/17/2001  . Smokeless tobacco: Never Used     Comment: Quit smoking in 2003  . Alcohol Use: No  . Drug Use: No  . Sexual Activity: No   Other Topics Concern  . Not on file   Social History Narrative   Divorced.   Sister had colon perforation & died from complications in [place], [place]    Allergies  Allergen Reactions  . Levaquin [Levofloxacin In D5w] Swelling  . Macrodantin [Nitrofurantoin Macrocrystal] Swelling  . Phenothiazines Anaphylaxis and Hives  . Polysorbate Anaphylaxis  . Prednisone Shortness Of Breath  . Buspirone Itching  . Cardura [Doxazosin Mesylate] Itching  . Codeine Itching  . Prochlorperazine Other (See Comments)    unknown  . Atorvastatin Hives     Cramping; tolerates Crestor ok  . Ofloxacin Rash  . Other Itching and Rash    "WOOL"= make skin look like it has been burned  . Penicillins Other (See Comments)    Causes redness all over.   . Pimozide Hives and Itching    Current Outpatient Prescriptions  Medication Sig Dispense Refill  . acetaminophen (TYLENOL) 500 MG tablet Take 500 mg by mouth every 6 (six) hours as needed for mild pain.     [name] Kitchen ALPRAZolam (XANAX) 0.25 MG tablet Take 0.25 mg by mouth 2 (two) times daily as needed for anxiety.     [name] Kitchen aspirin 81 MG EC tablet Take 81 mg by mouth every morning.     . carvedilol (COREG) 12.5 MG tablet TAKE ONE TABLET BY MOUTH TWICE DAILY. 60 tablet 5  . CRESTOR 5 MG tablet Take 5 mg by mouth every morning.     . DULoxetine (CYMBALTA) 60 MG capsule Take 60 mg by mouth at bedtime.     . fluticasone (FLONASE) 50 MCG/ACT nasal spray Place 2 sprays into both nostrils daily.    . furosemide (LASIX) 40 MG tablet Take 40 mg by mouth daily.    [name] Kitchen loperamide (IMODIUM A-D) 2 MG tablet Take 2 mg by mouth 4 (four) times daily as needed for diarrhea or loose stools.    . nitroGLYCERIN (NITROSTAT) 0.4 MG SL tablet Place 0.4 mg under the tongue every 5 (five) minutes as needed for chest pain.    . pantoprazole (PROTONIX) 40 MG tablet Take 40 mg by mouth every morning.     . potassium chloride (K-DUR,KLOR-CON) 10 MEQ tablet Take 2 tablets (20 mEq total) by mouth daily. 60 tablet 3  . ranolazine (RANEXA) 1000 MG SR tablet Take 1 tablet (1,000 mg total) by mouth 2 (two) times daily. (Patient taking differently: Take 500 mg by mouth 2 (two) times daily. ) 60 tablet 6  . traMADol (ULTRAM) 50 MG tablet Take 50 mg by mouth 2 (two) times daily as needed.     No current facility-administered medications for this visit.    REVIEW OF SYSTEMS:  [X]  denotes positive finding, [ ]  denotes negative finding Cardiac  Comments:  Chest pain or chest pressure: x   Shortness of breath upon exertion:    Short of breath when  lying flat: x   Irregular heart rhythm:        Vascular    Pain in calf, thigh, or hip brought on by ambulation: x   Pain in feet at night that wakes you up from your sleep:  x  Blood clot in your veins:    Leg swelling:  x       Pulmonary    Oxygen at home:    Productive cough:     Wheezing:         Neurologic    Sudden weakness in arms or legs:     Sudden numbness in arms or legs:     Sudden onset of difficulty speaking or slurred speech:    Temporary loss of vision in one eye:     Problems with dizziness:         Gastrointestinal    Blood in stool:     Vomited blood:         Genitourinary    Burning when urinating:     Blood in urine:        Psychiatric    Major depression:  x       Hematologic    Bleeding problems:    Problems with blood clotting too easily:        Skin    Rashes or ulcers: x       Constitutional    Fever or chills:      PHYSICAL EXAM: Filed Vitals:   03/05/15 1319 03/05/15 1322  BP: 161/58 160/55  Pulse: 57   Height: 5\' 2"  (1.575 m)   Weight: 177 lb 8 oz (80.513 kg)   SpO2: 95%     GENERAL: The patient is a well-nourished female, in no acute distress. The vital signs are documented above. CARDIAC: There is a regular rate and rhythm.  VASCULAR: No carotid bruits. Palpable radial pulses bilaterally. Palpable right femoral pulse. Non palpable left femoral pulse. Palpable left dorsalis pedis pulse. Non palpable right dorsalis pedis pulse. Nonpalpable posterior tibial pulses. Feet are warm and pink. No ischemic changes noted.  PULMONARY: clear to auscultation bilaterally ABDOMEN: Soft and non-tender with normal pitched bowel sounds.  MUSCULOSKELETAL: There are no major deformities or cyanosis. NEUROLOGIC: No focal weakness or paresthesias are detected. SKIN: There are no ulcers or rashes noted. PSYCHIATRIC: The patient has a normal affect. LYMPH: no palpable LAD ENT: no oropharynx or nasopharynx erythema,  NECK: soft without  rigidity  DATA:  ABIs (03/05/15) RLE: 0.90, TBI: 0.55. Biphasic posterior tibial and dorsalis pedis waveforms LLE: 0.96, TBI: 0.73. Biphasic posterior tibial and dorsalis pedis waveforms.   Lower Extremity Arterial Duplex (03/05/15) <50% stenosis at distal right SFA with with PSV of 248 cm/s and <50% at left mid SFA with PSV of 220 cm/s  MEDICAL ISSUES:  Asymptomatic peripheral vascular disease The patient does have evidence of of peripheral vascular disease based on today's non-invasive studies. She does have biphasic waveforms bilaterally. She does not ambulate enough to elicit true claudication, as her ambulation is limited by her breathing. She has not had any poorly healing wounds. Her symptoms are likely of a neurologic etiology given that she has pain and numbness with both rest and ambulation. This is likely from her lower back. Recommended follow-up with her previous spine surgeon.   She is on maximal medical management with ASA and statin. Fortunately, she is no longer a smoker. Recommended regular exercise.   She will follow up on an as-needed basis.    Alvia Grove, PA-C [facility] [place]  Addendum  I have independently interviewed and examined the patient, and I agree with the physician assistant's findings.  Patient's arterial duplex demonstrates hemodynamically significant stenoses in both SFA but intact biphasic flow in  both.  Pt has PAD but I doubt it is the etiology of her sx as she still has biphasic tibial signals.  I doubt any advantage to an aggressive stance in this patient.  I would favor maximal medical management.  Follow up as needed.  At some point, I suspect her PAD will progress.  Adele Barthel, MD [facility] [place] Office: [phone] Pager: [phone]  03/05/2015, 4:59 PM

## 2015-03-09 ENCOUNTER — Encounter: Payer: Self-pay | Admitting: Physician Assistant

## 2015-03-09 DIAGNOSIS — I10 Essential (primary) hypertension: Secondary | ICD-10-CM | POA: Diagnosis not present

## 2015-03-09 DIAGNOSIS — I779 Disorder of arteries and arterioles, unspecified: Secondary | ICD-10-CM | POA: Diagnosis not present

## 2015-03-09 DIAGNOSIS — I5030 Unspecified diastolic (congestive) heart failure: Secondary | ICD-10-CM | POA: Diagnosis not present

## 2015-03-09 DIAGNOSIS — J449 Chronic obstructive pulmonary disease, unspecified: Secondary | ICD-10-CM | POA: Diagnosis not present

## 2015-03-16 ENCOUNTER — Other Ambulatory Visit (HOSPITAL_COMMUNITY): Payer: Self-pay | Admitting: Respiratory Therapy

## 2015-03-16 DIAGNOSIS — G4730 Sleep apnea, unspecified: Secondary | ICD-10-CM

## 2015-04-01 ENCOUNTER — Other Ambulatory Visit: Payer: Self-pay

## 2015-04-01 ENCOUNTER — Ambulatory Visit (INDEPENDENT_AMBULATORY_CARE_PROVIDER_SITE_OTHER): Payer: Medicare Other | Admitting: Gastroenterology

## 2015-04-01 ENCOUNTER — Encounter: Payer: Self-pay | Admitting: Gastroenterology

## 2015-04-01 VITALS — BP 119/61 | HR 69 | Temp 97.6°F | Ht 61.0 in | Wt 179.4 lb

## 2015-04-01 DIAGNOSIS — I2510 Atherosclerotic heart disease of native coronary artery without angina pectoris: Secondary | ICD-10-CM

## 2015-04-01 DIAGNOSIS — R1319 Other dysphagia: Secondary | ICD-10-CM

## 2015-04-01 DIAGNOSIS — R194 Change in bowel habit: Secondary | ICD-10-CM

## 2015-04-01 DIAGNOSIS — R1314 Dysphagia, pharyngoesophageal phase: Secondary | ICD-10-CM

## 2015-04-01 DIAGNOSIS — K648 Other hemorrhoids: Secondary | ICD-10-CM

## 2015-04-01 DIAGNOSIS — R197 Diarrhea, unspecified: Secondary | ICD-10-CM

## 2015-04-01 DIAGNOSIS — R1084 Generalized abdominal pain: Secondary | ICD-10-CM

## 2015-04-01 DIAGNOSIS — R1310 Dysphagia, unspecified: Secondary | ICD-10-CM

## 2015-04-01 MED ORDER — PEG 3350-KCL-NA BICARB-NACL 420 G PO SOLR: 4000.0000 mL | Freq: Once | ORAL | Status: DC

## 2015-04-01 NOTE — Assessment & Plan Note (Signed)
81 y/o female with prior suspect cervical esophageal web s/p dilation in 2012 with long-lived improvement of her symptoms post dilation who presents with progressive dysphagia over the course of several months. Symptoms are somewhat compounded by chronic postnasal drip, smoking status. She does complain of quite a bit of upper respiratory congestion especially early in the mornings. She reports solid food and pill dysphagia with concerns for eating due to the symptoms. Offered patient upper endoscopy with dilation for further management and deep sedation in the OR due to prior requirements and polypharmacy.  I have discussed the risks, alternatives, benefits with regards to but not limited to the risk of reaction to medication, bleeding, infection, perforation and the patient is agreeable to proceed. Written consent to be obtained.

## 2015-04-01 NOTE — Assessment & Plan Note (Signed)
Greater than 6 month history of change in bowel habits. Went from constipation back in April to several month history of predominantly diarrhea. If stool is softer solid, she describes thin caliber stools with difficulty passing due to hemorrhoidal inflammation. Complains of fecal urgency with incontinence. She has had a couple of hospitalizations this year and likely antibiotic therapy. We will check stool studies for infectious etiology but go ahead and plan for colonoscopy with deep sedation in the OR given polypharmacy for history of change in bowel habits, diarrhea.  I have discussed the risks, alternatives, benefits with regards to but not limited to the risk of reaction to medication, bleeding, infection, perforation and the patient is agreeable to proceed. Written consent to be obtained.

## 2015-04-01 NOTE — Patient Instructions (Signed)
1. Colonoscopy and upper endoscopy as scheduled. See separate instructions.  2. Please collect stool for testing.

## 2015-04-01 NOTE — Progress Notes (Signed)
Primary Care Physician: [name]  Primary Gastroenterologist:  Garfield Cornea, MD   Chief Complaint  Patient presents with  . Dysphagia    HPI: [name] is a 81 y.o. female here with complaints of dysphagia and diarrhea. She was last seen in April of this year. She has a history of chronic nausea which she feels related to postnasal drip. She was having issues with hemorrhoids at her last office visit, constipation. Started on Amitiza but at some point she came off of therapy. Since her last office visit lab work showed slight decline in her hemoglobin. She never completed her ifobt as requested. Has been hospitalized earlier this year with CHF and once for atypical chest pain, ruled out for MI.    Presents today with worsening dysphagia. When swallow fells like everything sticking in throat. She has a lot of problems with postnasal drip. Recently has had a lot of pressure in ears and sinuses. Severe coughing in the morning to get upper airway cleared out, clear thick mucous. Some episodes of waking up in the middle the night strangling. Solid foods and pills are difficult to swallow. Feels like everything is sticking in her upper throat. Last EGD in 2012 with suspected cervical esophageal with. Esophageal dilation helped for several years. When I saw her back in April she was complaining of constipation. At some point since that time she developed diarrhea. She's having significant diarrhea at least 4 times per week. Occurring for several months. Takes Imodium as needed. Has to wear depends due to fecal urgency and incontinence. If she's not having diarrhea she feels the urge to have a bowel movement but feels like her hemorrhoids are so swollen she cannot pass stool. Stool would be thin in caliber. No rectal bleeding. No melena. Also with lower abdominal discomfort and in the epigastric area as well. Hurts before bowel movements. Complains of difficulty emptying bladder.  States she had procedure by urologist years ago. Complains of nocturnal diarrhea 4 times per night. Last urologist Dr. Michela Pitcher.  Xanax once per month to help rest. DRE in 06/2014.    Current Outpatient Prescriptions  Medication Sig Dispense Refill  . acetaminophen (TYLENOL) 500 MG tablet Take 500 mg by mouth every 6 (six) hours as needed for mild pain.     [name] Kitchen ALPRAZolam (XANAX) 0.25 MG tablet Take 0.25 mg by mouth 2 (two) times daily as needed for anxiety.     [name] Kitchen aspirin 81 MG EC tablet Take 81 mg by mouth every morning.     . carvedilol (COREG) 12.5 MG tablet TAKE ONE TABLET BY MOUTH TWICE DAILY. 60 tablet 5  . CRESTOR 5 MG tablet Take 5 mg by mouth every morning.     . DULoxetine (CYMBALTA) 60 MG capsule Take 60 mg by mouth at bedtime.     . fluticasone (FLONASE) 50 MCG/ACT nasal spray Place 2 sprays into both nostrils daily.    . furosemide (LASIX) 40 MG tablet Take 40 mg by mouth daily.    [name] Kitchen loperamide (IMODIUM A-D) 2 MG tablet Take 2 mg by mouth 4 (four) times daily as needed for diarrhea or loose stools.    . nitroGLYCERIN (NITROSTAT) 0.4 MG SL tablet Place 0.4 mg under the tongue every 5 (five) minutes as needed for chest pain.    . pantoprazole (PROTONIX) 40 MG tablet Take 40 mg by mouth every morning.     . potassium chloride (K-DUR,KLOR-CON) 10 MEQ tablet Take 2 tablets (20  mEq total) by mouth daily. 60 tablet 3   No current facility-administered medications for this visit.    Allergies as of 04/01/2015 - Review Complete 04/01/2015  Allergen Reaction Noted  . Levaquin [levofloxacin in d5w] Swelling 07/06/2014  . Macrodantin [nitrofurantoin macrocrystal] Swelling 07/06/2014  . Phenothiazines Anaphylaxis and Hives 08/16/2011  . Polysorbate Anaphylaxis 08/16/2011  . Prednisone Shortness Of Breath   . Buspirone Itching 07/06/2014  . Cardura [doxazosin mesylate] Itching 07/06/2014  . Codeine Itching   . Prochlorperazine Other (See Comments) 08/16/2011  . Atorvastatin Hives  08/16/2011  . Ofloxacin Rash   . Other Itching and Rash 10/10/2012  . Penicillins Other (See Comments) 11/09/2011  . Pimozide Hives and Itching 08/16/2011   Past Medical History  Diagnosis Date  . PVD (peripheral vascular disease) ([place])   . Other and unspecified hyperlipidemia   . Coronary atherosclerosis of native coronary artery     nonobstructive CAD by multiple catheterizations  . Panic disorder without agoraphobia   . HTN (hypertension)     Hx of it  . MI (myocardial infarction) ([place]) 2006  . Internal hemorrhoids without mention of complication   . Dysphagia, unspecified(787.20)   . Microscopic colitis 2003  . GERD (gastroesophageal reflux disease)     Hx Schatzki's ring, multiple EGD/ED last 01/06/2004  . Hyperlipemia   . Thyroid disease     recent abnl TSH per pt  . S/P colonoscopy 09/27/2001    internal hemorrhoids, desc colon inflam polyp, SB BX-chronic duodenitis, colitis  . Arthritis   . Shortness of breath   . Heart disease   . Bursitis     left shoulder  . Hyperlipidemia   . Sleep disorder     obstructive  . COPD (chronic obstructive pulmonary disease) ([place])   . Complication of anesthesia   . PONV (postoperative nausea and vomiting)     'a little nausea"  . Anxiety   . Depression   . Pneumonia 12/2011  . Heart murmur     'a littel'  . Cataract   . Paresthesia     hands, feet  . CHF (congestive heart failure) [facility])    Past Surgical History  Procedure Laterality Date  . Tonsillectomy    . Unspecified area, hysterectomy  1972    partial  . Cholecystectomy  1998  . Right knee replacement  2007  . Right leg benign tumor    . Breast lumpectomy  1998    left, benign  . Left hand surgery    . Left rotator cuff surgery    . Nasal sinus surgery    . Bladder tack  06/2010  . Maloney dilation  03/16/2011    Gastritis. No H.pylori on bx. 24F maloney dilation with disruption of  occult cevical esophageal web  . Colonoscopy  03/16/2011    multiple  hyperplastic colon polyps, sigmoid diverticulosis, melanosis coli  . Bladder suspension  11/09/2011    Procedure: TRANSVAGINAL TAPE (TVT) PROCEDURE;  Surgeon: Marissa Nestle, MD;  Location: [facility];  Service: Urology;  Laterality: N/A;  . Abdominal hysterectomy    . Appendectomy    . Carpal tunnel release  1989    left  . Anterior and posterior repair      with resection of vagina  . Shoulder surgery Left   . Joint replacement Right 2007  . Lumbar laminectomy/decompression microdiscectomy N/A 10/11/2012    Procedure: LUMBAR LAMINECTOMY/DECOMPRESSION MICRODISCECTOMY 2 LEVELS;  Surgeon: Floyce Stakes, MD;  Location: [facility];  Service: Neurosurgery;  Laterality: N/A;  L3-4 L4-5 Laminectomy  . Left heart catheterization with coronary angiogram N/A 05/14/2013    Procedure: LEFT HEART CATHETERIZATION WITH CORONARY ANGIOGRAM;  Surgeon: Blane Ohara, MD;  Location: [facility] CATH LAB;  Service: Cardiovascular;  Laterality: N/A;  . Cardiac catheterization    . Cardiac catheterization    . Coronary angioplasty with stent placement     Family History  Problem Relation Age of Onset  . Coronary artery disease Other     family Hx-sons  . Cancer Other   . Stroke Other     family Hx  . Hypertension Other     family Hx  . Diabetes Brother   . Heart disease Son     before age 57   Social History   Social History  . Marital Status: Divorced    Spouse Name: N/A  . Number of Children: 5  . Years of Education: N/A   Occupational History  . retired    Social History Main Topics  . Smoking status: Former Smoker -- 1.00 packs/day for 64 years    Types: Cigarettes    Start date: 12/24/1947    Quit date: 11/17/2001  . Smokeless tobacco: Never Used     Comment: Quit smoking in 2003  . Alcohol Use: No  . Drug Use: No  . Sexual Activity: No   Other Topics Concern  . None   Social History Narrative   Divorced.   Sister had colon perforation & died from complications in [place], [place]      ROS:  General: Negative for anorexia, weight loss, fever, chills, fatigue, weakness. ENT: Negative for hoarseness, difficulty swallowing , nasal congestion. CV: Negative for chest pain, angina, palpitations, dyspnea on exertion, peripheral edema.  Respiratory: Negative for dyspnea at rest, dyspnea on exertion,  wheezing. See hpi GI: See history of present illness. GU:  Negative for dysuria, hematuria,. See HPI.  Endo: Negative for unusual weight change.    Physical Examination:   BP 119/61 mmHg  Pulse 69  Temp(Src) 97.6 F (36.4 C) (Oral)  Ht 5\' 1"  (1.549 m)  Wt 179 lb 6.4 oz (81.375 kg)  BMI 33.91 kg/m2  General: Well-nourished, well-developed in no acute distress.  Eyes: No icterus. Mouth: Oropharyngeal mucosa moist and pink , no lesions erythema or exudate. Lungs: Clear to auscultation bilaterally.  Heart: Regular rate and rhythm, no murmurs rubs or gallops.  Abdomen: Bowel sounds are normal, nondistended, no hepatosplenomegaly or masses, no abdominal bruits or hernia , no rebound or guarding.  Mild tenderness in both flanks with deep palpation. Extremities: No lower extremity edema. No clubbing or deformities. Neuro: Alert and oriented x 4   Skin: Warm and dry, no jaundice.   Psych: Alert and cooperative, normal mood and affect.  Labs:  Lab Results  Component Value Date   WBC 6.8 02/21/2015   HGB 12.9 02/21/2015   HCT 36.9 02/21/2015   MCV 92.3 02/21/2015   PLT 214 02/21/2015   Lab Results  Component Value Date   CREATININE 1.19* 02/21/2015   BUN 20 02/21/2015   NA 135 02/21/2015   K 3.2* 02/21/2015   CL 99* 02/21/2015   CO2 28 02/21/2015   Lab Results  Component Value Date   ALT 14 02/21/2015   AST 19 02/21/2015   ALKPHOS 57 02/21/2015   BILITOT 0.5 02/21/2015    Imaging Studies: No results found.

## 2015-04-02 NOTE — Progress Notes (Signed)
cc'ed to pcp °

## 2015-04-05 ENCOUNTER — Other Ambulatory Visit: Payer: Self-pay | Admitting: Gastroenterology

## 2015-04-05 ENCOUNTER — Ambulatory Visit (INDEPENDENT_AMBULATORY_CARE_PROVIDER_SITE_OTHER): Payer: Medicare Other

## 2015-04-05 DIAGNOSIS — R194 Change in bowel habit: Secondary | ICD-10-CM

## 2015-04-05 DIAGNOSIS — K648 Other hemorrhoids: Secondary | ICD-10-CM | POA: Diagnosis not present

## 2015-04-05 DIAGNOSIS — R197 Diarrhea, unspecified: Secondary | ICD-10-CM | POA: Diagnosis not present

## 2015-04-05 DIAGNOSIS — R1314 Dysphagia, pharyngoesophageal phase: Secondary | ICD-10-CM | POA: Diagnosis not present

## 2015-04-05 DIAGNOSIS — R1084 Generalized abdominal pain: Secondary | ICD-10-CM | POA: Diagnosis not present

## 2015-04-05 LAB — IFOBT (OCCULT BLOOD): IFOBT: NEGATIVE

## 2015-04-05 NOTE — Progress Notes (Signed)
IFOBT is negative

## 2015-04-05 NOTE — Patient Instructions (Signed)
[name]  04/05/2015     @PREFPERIOPPHARMACY @   Your procedure is scheduled on  04/08/2015   Report to [name] at  84  A.M.  Call this number if you have problems the morning of surgery:  [phone]   Remember:  Do not eat food or drink liquids after midnight.  Take these medicines the morning of surgery with A SIP OF WATER  Xanax, coreg, cymbalta, protonix.   Do not wear jewelry, make-up or nail polish.  Do not wear lotions, powders, or perfumes.  You may wear deodorant.  Do not shave 48 hours prior to surgery.  Men may shave face and neck.  Do not bring valuables to the hospital.  [facility] is not responsible for any belongings or valuables.  Contacts, dentures or bridgework may not be worn into surgery.  Leave your suitcase in the car.  After surgery it may be brought to your room.  For patients admitted to the hospital, discharge time will be determined by your treatment team.  Patients discharged the day of surgery will not be allowed to drive home.   Name and phone number of your driver:   family Special instructions:  Follow the diet and prep instructions given to you by Dr Roseanne Kaufman office.  Please read over the following fact sheets that you were given. Pain Booklet, Coughing and Deep Breathing, Surgical Site Infection Prevention, Anesthesia Post-op Instructions and Care and Recovery After Surgery      Esophagogastroduodenoscopy Esophagogastroduodenoscopy (EGD) is a procedure that is used to examine the lining of the esophagus, stomach, and first part of the small intestine (duodenum). A long, flexible, lighted tube with a camera attached (endoscope) is inserted down the throat to view these organs. This procedure is done to detect problems or abnormalities, such as inflammation, bleeding, ulcers, or growths, in order to treat them. The procedure lasts 5-20 minutes. It is usually an outpatient procedure, but it may need to be performed in a  hospital in emergency cases. LET [facility] CARE PROVIDER KNOW ABOUT:  Any allergies you have.  All medicines you are taking, including vitamins, herbs, eye drops, creams, and over-the-counter medicines.  Previous problems you or members of your family have had with the use of anesthetics.  Any blood disorders you have.  Previous surgeries you have had.  Medical conditions you have. RISKS AND COMPLICATIONS Generally, this is a safe procedure. However, problems can occur and include:  Infection.  Bleeding.  Tearing (perforation) of the esophagus, stomach, or duodenum.  Difficulty breathing or not being able to breathe.  Excessive sweating.  Spasms of the larynx.  Slowed heartbeat.  Low blood pressure. BEFORE THE PROCEDURE  Do not eat or drink anything after midnight on the night before the procedure or as directed by your health care provider.  Do not take your regular medicines before the procedure if your health care provider asks you not to. Ask your health care provider about changing or stopping those medicines.  If you wear dentures, be prepared to remove them before the procedure.  Arrange for someone to drive you home after the procedure. PROCEDURE  A numbing medicine (local anesthetic) may be sprayed in your throat for comfort and to stop you from gagging or coughing.  You will have an IV tube inserted in a vein in your hand or arm. You will receive medicines and fluids through this tube.  You will be given  a medicine to relax you (sedative).  A pain reliever will be given through the IV tube.  A mouth guard may be placed in your mouth to protect your teeth and to keep you from biting on the endoscope.  You will be asked to lie on your left side.  The endoscope will be inserted down your throat and into your esophagus, stomach, and duodenum.  Air will be put through the endoscope to allow your health care provider to clearly view the lining of your  esophagus.  The lining of your esophagus, stomach, and duodenum will be examined. During the exam, your health care provider may:  Remove tissue to be examined under a microscope (biopsy) for inflammation, infection, or other medical problems.  Remove growths.  Remove objects (foreign bodies) that are stuck.  Treat any bleeding with medicines or other devices that stop tissues from bleeding (hot cautery, clipping devices).  Widen (dilate) or stretch narrowed areas of your esophagus and stomach.  The endoscope will be withdrawn. AFTER THE PROCEDURE  You will be taken to a recovery area for observation. Your blood pressure, heart rate, breathing rate, and blood oxygen level will be monitored often until the medicines you were given have worn off.  Do not eat or drink anything until the numbing medicine has worn off and your gag reflex has returned. You may choke.  Your health care provider should be able to discuss his or her findings with you. It will take longer to discuss the test results if any biopsies were taken.   This information is not intended to replace advice given to you by your health care provider. Make sure you discuss any questions you have with your health care provider.   Document Released: 08/04/2004 Document Revised: 04/24/2014 Document Reviewed: 03/06/2012 Elsevier Interactive Patient Education 2016 Elsevier Inc. Esophageal Dilatation Esophageal dilatation is a procedure to open a blocked or narrowed part of the esophagus. The esophagus is the long tube in your throat that carries food and liquid from your mouth to your stomach. The procedure is also called esophageal dilation.  You may need this procedure if you have a buildup of scar tissue in your esophagus that makes it difficult, painful, or even impossible to swallow. This can be caused by gastroesophageal reflux disease (GERD). In rare cases, people need this procedure because they have cancer of the esophagus  or a problem with the way food moves through the esophagus. Sometimes you may need to have another dilatation to enlarge the opening of the esophagus gradually. LET [facility] CARE PROVIDER KNOW ABOUT:   Any allergies you have.  All medicines you are taking, including vitamins, herbs, eye drops, creams, and over-the-counter medicines.  Previous problems you or members of your family have had with the use of anesthetics.  Any blood disorders you have.  Previous surgeries you have had.  Medical conditions you have.  Any antibiotic medicines you are required to take before dental procedures. RISKS AND COMPLICATIONS Generally, this is a safe procedure. However, problems can occur and include:  Bleeding from a tear in the lining of the esophagus.  A hole (perforation) in the esophagus. BEFORE THE PROCEDURE  Do not eat or drink anything after midnight on the night before the procedure or as directed by your health care provider.  Ask your health care provider about changing or stopping your regular medicines. This is especially important if you are taking diabetes medicines or blood thinners.  Plan to have someone take  you home after the procedure. PROCEDURE   You will be given a medicine that makes you relaxed and sleepy (sedative).  A medicine may be sprayed or gargled to numb the back of the throat.  Your health care provider can use various instruments to do an esophageal dilatation. During the procedure, the instrument used will be placed in your mouth and passed down into your esophagus. Options include:  Simple dilators. This instrument is carefully placed in the esophagus to stretch it.  Guided wire bougies. In this method, a flexible tube (endoscope) is used to insert a wire into the esophagus. The dilator is passed over this wire to enlarge the esophagus. Then the wire is removed.  Balloon dilators. An endoscope with a small balloon at the end is passed down into the  esophagus. Inflating the balloon gently stretches the esophagus and opens it up. AFTER THE PROCEDURE  Your blood pressure, heart rate, breathing rate, and blood oxygen level will be monitored often until the medicines you were given have worn off.  Your throat may feel slightly sore and will probably still feel numb. This will improve slowly over time.  You will not be allowed to eat or drink until the throat numbness has resolved.  If this is a same-day procedure, you may be allowed to go home once you have been able to drink, urinate, and sit on the edge of the bed without nausea or dizziness.  If this is a same-day procedure, you should have a friend or family member with you for the next 24 hours after the procedure.   This information is not intended to replace advice given to you by your health care provider. Make sure you discuss any questions you have with your health care provider.   Document Released: 05/25/2005 Document Revised: 04/24/2014 Document Reviewed: 08/13/2013 Elsevier Interactive Patient Education 2016 [employer]. Colonoscopy A colonoscopy is an exam to look at the entire large intestine (colon). This exam can help find problems such as tumors, polyps, inflammation, and areas of bleeding. The exam takes about 1 hour.  LET [facility] CARE PROVIDER KNOW ABOUT:   Any allergies you have.  All medicines you are taking, including vitamins, herbs, eye drops, creams, and over-the-counter medicines.  Previous problems you or members of your family have had with the use of anesthetics.  Any blood disorders you have.  Previous surgeries you have had.  Medical conditions you have. RISKS AND COMPLICATIONS  Generally, this is a safe procedure. However, as with any procedure, complications can occur. Possible complications include:  Bleeding.  Tearing or rupture of the colon wall.  Reaction to medicines given during the exam.  Infection (rare). BEFORE THE PROCEDURE    Ask your health care provider about changing or stopping your regular medicines.  You may be prescribed an oral bowel prep. This involves drinking a large amount of medicated liquid, starting the day before your procedure. The liquid will cause you to have multiple loose stools until your stool is almost clear or light green. This cleans out your colon in preparation for the procedure.  Do not eat or drink anything else once you have started the bowel prep, unless your health care provider tells you it is safe to do so.  Arrange for someone to drive you home after the procedure. PROCEDURE   You will be given medicine to help you relax (sedative).  You will lie on your side with your knees bent.  A long, flexible tube with  a light and camera on the end (colonoscope) will be inserted through the rectum and into the colon. The camera sends video back to a computer screen as it moves through the colon. The colonoscope also releases carbon dioxide gas to inflate the colon. This helps your health care provider see the area better.  During the exam, your health care provider may take a small tissue sample (biopsy) to be examined under a microscope if any abnormalities are found.  The exam is finished when the entire colon has been viewed. AFTER THE PROCEDURE   Do not drive for 24 hours after the exam.  You may have a small amount of blood in your stool.  You may pass moderate amounts of gas and have mild abdominal cramping or bloating. This is caused by the gas used to inflate your colon during the exam.  Ask when your test results will be ready and how you will get your results. Make sure you get your test results.   This information is not intended to replace advice given to you by your health care provider. Make sure you discuss any questions you have with your health care provider.   Document Released: 03/31/2000 Document Revised: 01/22/2013 Document Reviewed: 12/09/2012 Elsevier  Interactive Patient Education 2016 Elsevier Inc. PATIENT INSTRUCTIONS POST-ANESTHESIA  IMMEDIATELY FOLLOWING SURGERY:  Do not drive or operate machinery for the first twenty four hours after surgery.  Do not make any important decisions for twenty four hours after surgery or while taking narcotic pain medications or sedatives.  If you develop intractable nausea and vomiting or a severe headache please notify your doctor immediately.  FOLLOW-UP:  Please make an appointment with your surgeon as instructed. You do not need to follow up with anesthesia unless specifically instructed to do so.  WOUND CARE INSTRUCTIONS (if applicable):  Keep a dry clean dressing on the anesthesia/puncture wound site if there is drainage.  Once the wound has quit draining you may leave it open to air.  Generally you should leave the bandage intact for twenty four hours unless there is drainage.  If the epidural site drains for more than 36-48 hours please call the anesthesia department.  QUESTIONS?:  Please feel free to call your physician or the hospital operator if you have any questions, and they will be happy to assist you.

## 2015-04-06 ENCOUNTER — Encounter (HOSPITAL_COMMUNITY)
Admission: RE | Admit: 2015-04-06 | Discharge: 2015-04-06 | Disposition: A | Discharge status: Home / Self Care | Payer: Medicare Other | Source: Ambulatory Visit | Attending: Internal Medicine | Admitting: Internal Medicine

## 2015-04-06 ENCOUNTER — Other Ambulatory Visit: Payer: Self-pay

## 2015-04-06 DIAGNOSIS — Z01810 Encounter for preprocedural cardiovascular examination: Secondary | ICD-10-CM | POA: Diagnosis not present

## 2015-04-06 DIAGNOSIS — R194 Change in bowel habit: Secondary | ICD-10-CM | POA: Insufficient documentation

## 2015-04-06 LAB — CLOSTRIDIUM DIFFICILE BY PCR: Toxigenic C. Difficile by PCR: NOT DETECTED

## 2015-04-06 LAB — GIARDIA/CRYPTOSPORIDIUM (EIA)
Cryptosporidium Screen (EIA): NEGATIVE
GIARDIA SCREEN (EIA): NEGATIVE

## 2015-04-06 NOTE — Progress Notes (Signed)
Quick Note:  Await final stools ______

## 2015-04-06 NOTE — Pre-Procedure Instructions (Signed)
Patient in for PAT.  States since last night she has had Chest pain- mid sternum, both arms have numbness, severe diaphoresis, "my bed is so wet I need to change it", SOB. 'I dont feel good at all". 97.7-65-98% RR-20, 143/57. EKG performed. It was ok.  Patient wants to "postpone this for now". " My chest pain didn't bother me as much as the numbness all over my whole body that went into my arms". Instructed patient since she has felt this way since last night and is still having mid sternal chest pain( she states it is better now) she needs to go to ED. Patient does not want to go to ED, states "It's been this way for about 1 year." " I had a bad night and since I couldn't sleep I knitted all night, Im use to this but I just had a bad night and I really dont want to do this right now." " I am going to go home and rest, my son is there and I just want to postpone this." Patient was told again with the symptoms she had, th ED would be best,(no appearance of pain or difficulty while here, did want to talk talk at length about anything). Patient states that if she has anymore chest pain, numbness, SOB she will call EMS, "I just dont want to do this at the holiday". Patient will call Dr Roseanne Kaufman office when she is read to do this. Dr Roseanne Kaufman office notified spoke with, Ginger about all of the above.

## 2015-04-07 NOTE — Progress Notes (Signed)
Quick Note:  ifobt negative, egd/tcs as planned.awaiting stool culture final. ______

## 2015-04-08 ENCOUNTER — Ambulatory Visit (HOSPITAL_COMMUNITY): Admission: RE | Admit: 2015-04-08 | Payer: Medicare Other | Source: Ambulatory Visit | Admitting: Internal Medicine

## 2015-04-08 ENCOUNTER — Encounter (HOSPITAL_COMMUNITY): Admission: RE | Payer: Self-pay | Source: Ambulatory Visit

## 2015-04-08 SURGERY — ESOPHAGOGASTRODUODENOSCOPY (EGD) WITH PROPOFOL
Anesthesia: Monitor Anesthesia Care

## 2015-04-09 LAB — STOOL CULTURE

## 2015-04-14 NOTE — Progress Notes (Signed)
Quick Note:    Noted    ______

## 2015-04-14 NOTE — Progress Notes (Signed)
Quick Note:  All stools negative. Egd/tcs as planned. ______

## 2015-04-30 ENCOUNTER — Other Ambulatory Visit: Payer: Self-pay | Admitting: Cardiovascular Disease

## 2015-05-24 ENCOUNTER — Telehealth: Payer: Self-pay | Admitting: Internal Medicine

## 2015-05-24 NOTE — Telephone Encounter (Signed)
Called pt and she is aware that she needs to have another office visit before procedures

## 2015-05-24 NOTE — Telephone Encounter (Signed)
PATIENT CALLED TO STATE THAT SHE HAS APPT WITH HER HEART DOCTOR TOMORROW AT [facility] SO SHE CAN BE CLEARED FOR HER ENDO AND TCS TO BE SCHEDULED. SHE WAS WONDERING HOW LONG IT WOULD TAKE TO GET THAT DONE AFTER HER CLEARANCE   [phone]

## 2015-05-25 ENCOUNTER — Encounter: Payer: Self-pay | Admitting: Adult Health

## 2015-05-25 ENCOUNTER — Ambulatory Visit (INDEPENDENT_AMBULATORY_CARE_PROVIDER_SITE_OTHER): Payer: Medicare Other | Admitting: Adult Health

## 2015-05-25 VITALS — BP 122/66 | HR 68 | Ht 61.0 in | Wt 177.0 lb

## 2015-05-25 DIAGNOSIS — R0789 Other chest pain: Secondary | ICD-10-CM

## 2015-05-25 DIAGNOSIS — I10 Essential (primary) hypertension: Secondary | ICD-10-CM | POA: Diagnosis not present

## 2015-05-25 MED ORDER — FUROSEMIDE 20 MG PO TABS: 20.0000 mg | ORAL_TABLET | Freq: Every day | ORAL | Status: DC

## 2015-05-25 NOTE — Progress Notes (Signed)
Cardiology Office Note   Date:  05/25/2015   ID:  [name], DOB 1933-01-26, MRN [id number]  PCP:  [name]  Cardiologist:  [name]   No chief complaint on file.     History of Present Illness: [name] is a 82 y.o. female who presents for ongoing assessment and management of CAD, status post PCI in intervention with drug-eluting stent to the left circumflex coronary artery in January 2015.  LAD showed mild nonobstructive disease with moderately severe disease in a small, nondominant RCA.  Other history includes hypertension, hyperlipidemia, chronic diastolic heart failure, depression, and severe panic attacks.  She, unfortunately, continues to smoke and has history of COPD.  She was last seen by [name] on 11 2 2016, with complaints of exertional chest tightness.  Ranexa was increased to 1000 mg twice a day, she was continued on aspirin.  She is given reassurance that her nuclear study was normal.  In 2015.  She was found to be hypertensive and advised on low sodium diet.  She was referred to pulmonology for abnormal PFTs. She has also been seen by GI concerning GERD and is planned for colonoscopy and EGD.  They are waiting for cardiac evaluation prior to proceeding with this.  She was seen in the emergency room with recurrent chest pain, she had been taking extra doses of Ranexa at the request of her primary care, and was actually taking 4000 mg a day.  She states she was itching and having tremors.  She was taken off of the Ranexa.  She now states that she has recurrent discomfort, substernal, and epigastrically.  She is due to have an EGD and colonoscopy.  She comes with multiple somatic complaints.  Past Medical History  Diagnosis Date  . PVD (peripheral vascular disease) ([name])   . Other and unspecified hyperlipidemia   . Coronary atherosclerosis of native coronary artery     nonobstructive CAD by multiple catheterizations  . Panic  disorder without agoraphobia   . HTN (hypertension)     Hx of it  . MI (myocardial infarction) ([facility]) 2006  . Internal hemorrhoids without mention of complication   . Dysphagia, unspecified(787.20)   . Microscopic colitis 2003  . GERD (gastroesophageal reflux disease)     Hx Schatzki's ring, multiple EGD/ED last 01/06/2004  . Hyperlipemia   . Thyroid disease     recent abnl TSH per pt  . S/P colonoscopy 09/27/2001    internal hemorrhoids, desc colon inflam polyp, SB BX-chronic duodenitis, colitis  . Arthritis   . Shortness of breath   . Heart disease   . Bursitis     left shoulder  . Hyperlipidemia   . Sleep disorder     obstructive  . COPD (chronic obstructive pulmonary disease) ([facility])   . Complication of anesthesia   . PONV (postoperative nausea and vomiting)     'a little nausea"  . Anxiety   . Depression   . Pneumonia 12/2011  . Heart murmur     'a littel'  . Cataract   . Paresthesia     hands, feet  . CHF (congestive heart failure) [name])     Past Surgical History  Procedure Laterality Date  . Tonsillectomy    . Unspecified area, hysterectomy  1972    partial  . Cholecystectomy  1998  . Right knee replacement  2007  . Right leg benign tumor    . Breast lumpectomy  1998    left, benign  .  Left hand surgery    . Left rotator cuff surgery    . Nasal sinus surgery    . Bladder tack  06/2010  . Maloney dilation  03/16/2011    Gastritis. No H.pylori on bx. 47F maloney dilation with disruption of  occult cevical esophageal web  . Colonoscopy  03/16/2011    multiple hyperplastic colon polyps, sigmoid diverticulosis, melanosis coli  . Bladder suspension  11/09/2011    Procedure: TRANSVAGINAL TAPE (TVT) PROCEDURE;  Surgeon: Tonya Nestle, MD;  Location: [facility];  Service: Urology;  Laterality: N/A;  . Abdominal hysterectomy    . Appendectomy    . Carpal tunnel release  1989    left  . Anterior and posterior repair      with resection of vagina  . Shoulder surgery  Left   . Joint replacement Right 2007  . Lumbar laminectomy/decompression microdiscectomy N/A 10/11/2012    Procedure: LUMBAR LAMINECTOMY/DECOMPRESSION MICRODISCECTOMY 2 LEVELS;  Surgeon: Tonya Stakes, MD;  Location: [facility];  Service: Neurosurgery;  Laterality: N/A;  L3-4 L4-5 Laminectomy  . Left heart catheterization with coronary angiogram N/A 05/14/2013    Procedure: LEFT HEART CATHETERIZATION WITH CORONARY ANGIOGRAM;  Surgeon: Tonya Ohara, MD;  Location: [facility];  Service: Cardiovascular;  Laterality: N/A;  . Cardiac catheterization    . Cardiac catheterization    . Coronary angioplasty with stent placement       Current Outpatient Prescriptions  Medication Sig Dispense Refill  . acetaminophen (TYLENOL) 500 MG tablet Take 500 mg by mouth every 6 (six) hours as needed for mild pain.     [name] Kitchen ALPRAZolam (XANAX) 0.25 MG tablet Take 0.25 mg by mouth 2 (two) times daily as needed for anxiety.     [name] Kitchen aspirin 81 MG EC tablet Take 81 mg by mouth every morning.     . carvedilol (COREG) 12.5 MG tablet TAKE ONE TABLET BY MOUTH TWICE DAILY. 60 tablet 5  . DULoxetine (CYMBALTA) 60 MG capsule Take 60 mg by mouth at bedtime.     . fluticasone (FLONASE) 50 MCG/ACT nasal spray Place 2 sprays into both nostrils daily.    [name] Kitchen loperamide (IMODIUM A-D) 2 MG tablet Take 2 mg by mouth 4 (four) times daily as needed for diarrhea or loose stools.    . nitroGLYCERIN (NITROSTAT) 0.4 MG SL tablet Place 0.4 mg under the tongue every 5 (five) minutes as needed for chest pain.    . pantoprazole (PROTONIX) 40 MG tablet Take 40 mg by mouth every morning.     . polyethylene glycol-electrolytes (NULYTELY/GOLYTELY) 420 G solution Take 4,000 mLs by mouth once. 4000 mL 0  . potassium chloride (K-DUR,KLOR-CON) 10 MEQ tablet TAKE TWO (2) TABLETS BY MOUTH DAILY. 60 tablet 6  . furosemide (LASIX) 20 MG tablet Take 1 tablet (20 mg total) by mouth daily. 90 tablet 3   No current facility-administered medications for  this visit.    Allergies:   Levaquin; Macrodantin; Phenothiazines; Polysorbate; Prednisone; Buspirone; Cardura; Codeine; Prochlorperazine; Ranexa; Atorvastatin; Ofloxacin; Other; Penicillins; and Pimozide    Social History:  The patient  reports that she quit smoking about 13 years ago. Her smoking use included Cigarettes. She started smoking about 67 years ago. She has a 64 pack-year smoking history. She has never used smokeless tobacco. She reports that she does not drink alcohol or use illicit drugs.   Family History:  The patient's family history includes Cancer in her other; Coronary artery disease in her other; Diabetes in  her brother; Heart disease in her son; Hypertension in her other; Stroke in her other.    ROS: All other systems are reviewed and negative. Unless otherwise mentioned in H&P    PHYSICAL EXAM: VS:  BP 122/66 mmHg  Pulse 68  Ht 5\' 1"  (1.549 m)  Wt 177 lb (80.287 kg)  BMI 33.46 kg/m2  SpO2 94% , BMI Body mass index is 33.46 kg/(m^2). GEN: Well nourished, well developed, in no acute distress HEENT: normal Neck: no JVD, carotid bruits, or masses Cardiac: RRR; no murmurs, rubs, or gallops,no edema  Respiratory:  clear to auscultation bilaterally, normal work of breathing GI: soft, nontender, nondistended, + BS MS: no deformity or atrophy Skin: warm and dry, no rash Neuro:  Strength and sensation are intact Psych: euthymic mood, full affect   Recent Labs: 11/01/2014: B Natriuretic Peptide 58.0 02/21/2015: ALT 14; BUN 20; Creatinine, Ser 1.19*; Hemoglobin 12.9; Platelets 214; Potassium 3.2*; Sodium 135    Lipid Panel    Component Value Date/Time   CHOL 181 11/24/2013 1110   TRIG 111 11/24/2013 1110   HDL 56 11/24/2013 1110   CHOLHDL 3.2 11/24/2013 1110   VLDL 22 11/24/2013 1110   LDLCALC 103* 11/24/2013 1110      Wt Readings from Last 3 Encounters:  05/25/15 177 lb (80.287 kg)  04/01/15 179 lb 6.4 oz (81.375 kg)  03/05/15 177 lb 8 oz (80.513 kg)      ASSESSMENT AND PLAN:  1. Chest pain: She is been ruled out for cardiac etiology of her discomfort.  Echocardiogram revealed normal LV systolic function.  She is of low cardiac risk to proceed with EGD and colonoscopy.  I have written a letter has verified that she can have her procedure done when can be scheduled with GI.  2. Mild hypotension, with associated dizziness: I would decrease her Lasix to 20 mg daily from 40 mg, daily.  She has been having a lot of diarrhea and has been losing fluid.  She is to continue to take her potassium as she has a history of hypokalemia. 10 consider decreasing carvedilol, but will not do so at this time, as she will need beta blocker coverage.  Prior to having her EGD.    Current medicines are reviewed at length with the patient today.    Labs/ tests ordered today include: No orders of the defined types were placed in this encounter.     Disposition:   FU with 4 months.  Signed, Jory Sims, [name]  05/25/2015 5:05 PM    [facility] [address], [place], [place] [address] Phone: [phone]; Fax: [phone] this

## 2015-05-25 NOTE — Patient Instructions (Addendum)
Your physician wants you to follow-up in: 4 months with Arnold Long NP You will receive a reminder letter in the mail two months in advance. If you don't receive a letter, please call our office to schedule the follow-up appointment.     DECREASE Lasix to 20 mg daily    If you need a refill on your cardiac medications before your next appointment, please call your pharmacy.       Thank you for choosing [facility] !

## 2015-05-25 NOTE — Progress Notes (Signed)
Name: [name]    DOB: [birth year]  Age: 82 y.o.  MR#: [id number]       PCP:  [name]      Insurance: Payor: MEDICARE / Plan: MEDICARE PART A AND B / Product Type: *No Product type* /   CC:   No chief complaint on file.   VS Filed Vitals:   05/25/15 1508  BP: 122/66  Pulse: 68  Height: 5\' 1"  (1.549 m)  Weight: 177 lb (80.287 kg)  SpO2: 94%    Weights Current Weight  05/25/15 177 lb (80.287 kg)  04/01/15 179 lb 6.4 oz (81.375 kg)  03/05/15 177 lb 8 oz (80.513 kg)    Blood Pressure  BP Readings from Last 3 Encounters:  05/25/15 122/66  04/01/15 119/61  03/05/15 160/55     Admit date:  (Not on file) Last encounter with RMR:  Visit date not found   Allergy Levaquin; Macrodantin; Phenothiazines; Polysorbate; Prednisone; Buspirone; Cardura; Codeine; Prochlorperazine; Ranexa; Atorvastatin; Ofloxacin; Other; Penicillins; and Pimozide  Current Outpatient Prescriptions  Medication Sig Dispense Refill  . acetaminophen (TYLENOL) 500 MG tablet Take 500 mg by mouth every 6 (six) hours as needed for mild pain.     [name] Kitchen ALPRAZolam (XANAX) 0.25 MG tablet Take 0.25 mg by mouth 2 (two) times daily as needed for anxiety.     [name] Kitchen aspirin 81 MG EC tablet Take 81 mg by mouth every morning.     . carvedilol (COREG) 12.5 MG tablet TAKE ONE TABLET BY MOUTH TWICE DAILY. 60 tablet 5  . DULoxetine (CYMBALTA) 60 MG capsule Take 60 mg by mouth at bedtime.     . fluticasone (FLONASE) 50 MCG/ACT nasal spray Place 2 sprays into both nostrils daily.    . furosemide (LASIX) 40 MG tablet Take 40 mg by mouth daily.    [name] Kitchen loperamide (IMODIUM A-D) 2 MG tablet Take 2 mg by mouth 4 (four) times daily as needed for diarrhea or loose stools.    . nitroGLYCERIN (NITROSTAT) 0.4 MG SL tablet Place 0.4 mg under the tongue every 5 (five) minutes as needed for chest pain.    . pantoprazole (PROTONIX) 40 MG tablet Take 40 mg by mouth every morning.     . polyethylene glycol-electrolytes (NULYTELY/GOLYTELY) 420  G solution Take 4,000 mLs by mouth once. 4000 mL 0  . potassium chloride (K-DUR,KLOR-CON) 10 MEQ tablet TAKE TWO (2) TABLETS BY MOUTH DAILY. 60 tablet 6   No current facility-administered medications for this visit.    Discontinued Meds:    Medications Discontinued During This Encounter  Medication Reason  . CRESTOR 5 MG tablet Error    Patient Active Problem List   Diagnosis Date Noted  . Diarrhea 04/01/2015  . Bowel habit changes 04/01/2015  . Esophageal dysphagia 04/01/2015  . PAOD (peripheral arterial occlusive disease) ([place]) 03/05/2015  . Pain in the chest   . Hyperlipidemia   . Weight gain 08/12/2014  . Normocytic anemia 08/07/2014  . Hemorrhoids 08/06/2014  . Diastolic CHF, acute on chronic (HCC) 07/30/2014  . Dyspnea on exertion   . CHF (congestive heart failure) ([place]) 07/29/2014  . Rectal bleeding 07/06/2014  . Constipation 07/06/2014  . Chest pain 11/24/2013  . Lower extremity edema 11/24/2013  . Acute kidney failure ([place]) 11/24/2013  . Hypokalemia 11/24/2013  . Other and unspecified angina pectoris 05/14/2013  . Hematochezia 02/13/2011  . Abdominal pain 02/13/2011  . Major depression ([place]) 09/28/2010  . FATIGUE 04/13/2009  . Chronic diastolic heart failure ([place]) 11/30/2008  .  DYSPNEA 11/30/2008  . HYPERLIPIDEMIA-MIXED 11/27/2008  . CAD, NATIVE VESSEL - PCI + DES to left circumflex 05/14/13 11/27/2008  . Peripheral vascular disease ([place]) 11/27/2008  . PANIC ATTACK 02/28/2008  . MI 02/28/2008  . Internal hemorrhoids 02/28/2008  . GASTROESOPHAGEAL REFLUX DISEASE, CHRONIC 02/28/2008  . COLITIS 02/28/2008  . DYSPHAGIA UNSPECIFIED 02/28/2008  . History of cardiovascular disorder 02/28/2008    LABS    Component Value Date/Time   NA 135 02/21/2015 1656   NA 137 11/01/2014 1920   NA 139 07/31/2014 0716   K 3.2* 02/21/2015 1656   K 3.6 11/01/2014 1920   K 3.3* 07/31/2014 0716   CL 99* 02/21/2015 1656   CL 100* 11/01/2014 1920   CL 101 07/31/2014 0716    CO2 28 02/21/2015 1656   CO2 28 11/01/2014 1920   CO2 28 07/31/2014 0716   GLUCOSE 168* 02/21/2015 1656   GLUCOSE 102* 11/01/2014 1920   GLUCOSE 109* 07/31/2014 0716   BUN 20 02/21/2015 1656   BUN 21* 11/01/2014 1920   BUN 16 07/31/2014 0716   CREATININE 1.19* 02/21/2015 1656   CREATININE 1.17* 11/01/2014 2325   CREATININE 1.08* 11/01/2014 1920   CREATININE 1.17* 02/13/2011 1526   CALCIUM 8.5* 02/21/2015 1656   CALCIUM 8.5* 11/01/2014 1920   CALCIUM 8.5 07/31/2014 0716   GFRNONAA 42* 02/21/2015 1656   GFRNONAA 43* 11/01/2014 2325   GFRNONAA 47* 11/01/2014 1920   GFRAA 49* 02/21/2015 1656   GFRAA 50* 11/01/2014 2325   GFRAA 55* 11/01/2014 1920   CMP     Component Value Date/Time   NA 135 02/21/2015 1656   K 3.2* 02/21/2015 1656   CL 99* 02/21/2015 1656   CO2 28 02/21/2015 1656   GLUCOSE 168* 02/21/2015 1656   BUN 20 02/21/2015 1656   CREATININE 1.19* 02/21/2015 1656   CREATININE 1.17* 02/13/2011 1526   CALCIUM 8.5* 02/21/2015 1656   PROT 6.8 02/21/2015 1656   ALBUMIN 3.8 02/21/2015 1656   AST 19 02/21/2015 1656   ALT 14 02/21/2015 1656   ALKPHOS 57 02/21/2015 1656   BILITOT 0.5 02/21/2015 1656   GFRNONAA 42* 02/21/2015 1656   GFRAA 49* 02/21/2015 1656       Component Value Date/Time   WBC 6.8 02/21/2015 1656   WBC 6.6 11/01/2014 2325   WBC 6.1 11/01/2014 1920   HGB 12.9 02/21/2015 1656   HGB 11.8* 11/01/2014 2325   HGB 12.6 11/01/2014 1920   HCT 36.9 02/21/2015 1656   HCT 34.2* 11/01/2014 2325   HCT 36.6 11/01/2014 1920   MCV 92.3 02/21/2015 1656   MCV 87.2 11/01/2014 2325   MCV 86.9 11/01/2014 1920    Lipid Panel     Component Value Date/Time   CHOL 181 11/24/2013 1110   TRIG 111 11/24/2013 1110   HDL 56 11/24/2013 1110   CHOLHDL 3.2 11/24/2013 1110   VLDL 22 11/24/2013 1110   LDLCALC 103* 11/24/2013 1110    ABG No results found for: PHART, PCO2ART, PO2ART, HCO3, TCO2, ACIDBASEDEF, O2SAT   Lab Results  Component Value Date   TSH 2.770  11/25/2013   BNP (last 3 results)  Recent Labs  07/29/14 1838 11/01/14 1920  BNP 280.0* 58.0    ProBNP (last 3 results) No results for input(s): PROBNP in the last 8760 hours.  Cardiac Panel (last 3 results) No results for input(s): CKTOTAL, CKMB, TROPONINI, RELINDX in the last 72 hours.  Iron/TIBC/Ferritin/ %Sat No results found for: IRON, TIBC, FERRITIN, IRONPCTSAT   EKG Orders  placed or performed in visit on 04/06/15  . EKG 12-Lead  . EKG 12-Lead     Prior Assessment and Plan Problem List as of 05/25/2015      Cardiovascular and Mediastinum   MI   CAD, NATIVE VESSEL - PCI + DES to left circumflex 05/14/13   Last Assessment & Plan 05/29/2013 Office Visit Written 05/29/2013  2:04 PM by Lendon Colonel, NP    She offers no complaints of recurrent chest pain. She is medically compliant. Refills on NTG are provided.       Chronic diastolic heart failure [facility])   Last Assessment & Plan 05/29/2013 Office Visit Written 05/29/2013  2:04 PM by Lendon Colonel, NP    No evidence of fluid retention by exam. She is stable on her weight.      Peripheral vascular disease [facility])   Last Assessment & Plan 09/28/2010 Office Visit Written 09/28/2010  1:58 PM by Ezra Sites, MD    Patient reports no claudication.      Internal hemorrhoids   Last Assessment & Plan 07/06/2014 Office Visit Edited 07/06/2014  9:42 AM by Mahala Menghini, PA-C    Recent flare in hemorrhoids in setting of constipation and with rectal bleeding. Several illnesses lately and limited mobility like cause of constipation. Lat TCS 2012 as outlined under [place].   Start anusol HC suppositories bid for 12 days. miralax 1/2 to 1 capful daily to keep stool soft.  Patient not interested in [facility] hemorrhoid banding at this time. Return to the office in 3-4 weeks for follow up or call sooner if needed.       Other and unspecified angina pectoris   CHF (congestive heart failure) (HCC)   Diastolic CHF, acute on chronic [facility])    Hemorrhoids   Last Assessment & Plan 08/06/2014 Office Visit Edited 08/07/2014  1:46 PM by Mahala Menghini, PA-C    Trial of Proctocort foam anorectally bid for 10 days. Call with PR in couple of weeks. Patient has tolerated hydrocortisone hemorrhoid creams before (allergy to prednisone).      PAOD (peripheral arterial occlusive disease) [facility])     Digestive   GASTROESOPHAGEAL REFLUX DISEASE, CHRONIC   Last Assessment & Plan 04/20/2011 Office Visit Written 04/20/2011  4:13 PM by Mahala Menghini, PA    Well controlled.      COLITIS   DYSPHAGIA UNSPECIFIED   Last Assessment & Plan 02/13/2011 Office Visit Written 02/14/2011  7:59 PM by Andria Meuse, NP    Hx esophageal dysphagia w/ hx Schatzki's ring & esophageal motility disorder. May require repeat esophageal dilation.      Rectal bleeding   Constipation   Last Assessment & Plan 08/06/2014 Office Visit Written 08/07/2014  1:44 PM by Mahala Menghini, PA-C    Trial of Amitiza 26mcg BID with food. Stop Fiber and miralax which are likely contributing to her abdominal bloating/gas.      Esophageal dysphagia   Last Assessment & Plan 04/01/2015 Office Visit Written 04/01/2015  2:08 PM by Mahala Menghini, PA-C    82 y/o female with prior suspect cervical esophageal web s/p dilation in 2012 with long-lived improvement of her symptoms post dilation who presents with progressive dysphagia over the course of several months. Symptoms are somewhat compounded by chronic postnasal drip, smoking status. She does complain of quite a bit of upper respiratory congestion especially early in the mornings. She reports solid food and pill dysphagia with concerns for eating due to  the symptoms. Offered patient upper endoscopy with dilation for further management and deep sedation in the OR due to prior requirements and polypharmacy.  I have discussed the risks, alternatives, benefits with regards to but not limited to the risk of reaction to medication, bleeding, infection,  perforation and the patient is agreeable to proceed. Written consent to be obtained.         Genitourinary   Acute kidney failure [facility])     Other   HYPERLIPIDEMIA-MIXED   Last Assessment & Plan 08/16/2011 Office Visit Written 08/16/2011  2:57 PM by Donney Dice, PA    Patient  has documented intolerance to statins, most recently Crestor. Followup labs drawn yesterday in Dr. Edythe Lynn office, to whom we will defer future monitoring/management. Aggressive  treatment recommended, however, with target LDL 70 or less, if feasible.       PANIC ATTACK   FATIGUE   DYSPNEA   Last Assessment & Plan 02/12/2014 Office Visit Edited 02/12/2014  8:15 PM by Tanda Rockers, MD    - 02/12/2014  Walked RA x 1 lap  @ 185 ft each stopped due to  Sob, no desat, moderate pace  - 02/12/2014 spirometry >   FEV1  1.41 (83%) ratio 83   Symptoms are markedly disproportionate to objective findings and not clear this is a lung problem but pt does appear to have difficult airway management issues. DDX of  difficult airways management all start with A and  include Adherence, Ace Inhibitors, Acid Reflux, Active Sinus Disease, Alpha 1 Antitripsin deficiency, Anxiety masquerading as Airways dz,  ABPA,  allergy(esp in young), Aspiration (esp in elderly), Adverse effects of DPI,  Active smokers, plus two Bs  = Bronchiectasis and Beta blocker use..and one C= CHF  ? Acid (or non-acid) GERD > always difficult to exclude as up to 75% of pts in some series report no assoc GI/ Heartburn symptoms> rec max (24h)  acid suppression and diet restrictions/ reviewed and instructions given in writing.   ? Anxiety/ depression/ deconditioning all playing a role but not easily corrected.   ? chf > note breathing was better p stopped smoking until cardiac issues and wt gain > already f/u by cards, not decompensated at present  No evidence of a pulmonary problem here so f/u can be prn.      History of cardiovascular disorder   Last  Assessment & Plan 08/16/2011 Office Visit Written 08/16/2011  2:56 PM by Donney Dice, PA    Will defer to Dr. Edythe Lynn office for ongoing monitoring/management.      Major depression [facility])   Last Assessment & Plan 09/28/2010 Office Visit Written 09/28/2010  1:59 PM by Ezra Sites, MD    The patient's depression is quite severe she has been on Cymbalta and takes alprazolam for her anxiety attacks. She is more depressed and has significant insomnia. I added trazodone 50 mg by mouth each bedtimeboth as a sleeping aid as well as augmentation therapy for her depression.      Hematochezia   Last Assessment & Plan 02/13/2011 Office Visit Edited 02/14/2011  8:03 PM by Andria Meuse, NP    This is concerning.  Will most likely need colonoscopy to determine etiology since last colonoscopy over 3 yrs ago after CT for further evaluation.      Abdominal pain   Last Assessment & Plan 05/30/2011 Office Visit Written 05/30/2011  3:44 PM by Daneil Dolin, MD    One-year history of right  lower quadrant abdominal pain. Pain unrelenting and insidiously worsening. It has a positional component. I doubt this is radicular pain. Likewise, I doubt this is musculoskeletal pain. It sounds visceral to me. Wonder about adhesions or an occult process i.e. spigelian hernia. She's had a thorough workup to date.  Recommendations: Proceed with a surgery consultation for consideration of diagnostic laparoscopy with for lyses of adhesions, etc. I've taken the liberty to get her scheduled see Dr. Aviva Signs as her former general surgeon, Dr. Lindalou Hose, is no longer in the area.      Chest pain   Lower extremity edema   Hypokalemia   Dyspnea on exertion   Normocytic anemia   Last Assessment & Plan 08/06/2014 Office Visit Written 08/07/2014  1:45 PM by Mahala Menghini, PA-C    Noted during recent admission. Will recheck at this time.       Weight gain   Pain in the chest   Hyperlipidemia   Diarrhea   Bowel habit changes    Last Assessment & Plan 04/01/2015 Office Visit Written 04/01/2015  2:09 PM by Mahala Menghini, PA-C    Greater than 6 month history of change in bowel habits. Went from constipation back in April to several month history of predominantly diarrhea. If stool is softer solid, she describes thin caliber stools with difficulty passing due to hemorrhoidal inflammation. Complains of fecal urgency with incontinence. She has had a couple of hospitalizations this year and likely antibiotic therapy. We will check stool studies for infectious etiology but go ahead and plan for colonoscopy with deep sedation in the OR given polypharmacy for history of change in bowel habits, diarrhea.  I have discussed the risks, alternatives, benefits with regards to but not limited to the risk of reaction to medication, bleeding, infection, perforation and the patient is agreeable to proceed. Written consent to be obtained.           Imaging: No results found.

## 2015-05-26 NOTE — Telephone Encounter (Signed)
Patient has been cleared by cardiology. Please make OV here.

## 2015-05-26 NOTE — Telephone Encounter (Signed)
Noted  

## 2015-05-26 NOTE — Telephone Encounter (Signed)
APPOINTMENT MADE AND CALLED PATIENT WITH DATE AND TIME  °

## 2015-06-03 DIAGNOSIS — F419 Anxiety disorder, unspecified: Secondary | ICD-10-CM | POA: Diagnosis not present

## 2015-06-03 DIAGNOSIS — I5030 Unspecified diastolic (congestive) heart failure: Secondary | ICD-10-CM | POA: Diagnosis not present

## 2015-06-03 DIAGNOSIS — I739 Peripheral vascular disease, unspecified: Secondary | ICD-10-CM | POA: Diagnosis not present

## 2015-06-03 DIAGNOSIS — M545 Low back pain: Secondary | ICD-10-CM | POA: Diagnosis not present

## 2015-06-03 DIAGNOSIS — R739 Hyperglycemia, unspecified: Secondary | ICD-10-CM | POA: Diagnosis not present

## 2015-06-03 DIAGNOSIS — I2510 Atherosclerotic heart disease of native coronary artery without angina pectoris: Secondary | ICD-10-CM | POA: Diagnosis not present

## 2015-06-03 DIAGNOSIS — I10 Essential (primary) hypertension: Secondary | ICD-10-CM | POA: Diagnosis not present

## 2015-06-03 DIAGNOSIS — J019 Acute sinusitis, unspecified: Secondary | ICD-10-CM | POA: Diagnosis not present

## 2015-06-11 ENCOUNTER — Other Ambulatory Visit: Payer: Self-pay

## 2015-06-11 ENCOUNTER — Encounter: Payer: Self-pay | Admitting: Gastroenterology

## 2015-06-11 ENCOUNTER — Ambulatory Visit (INDEPENDENT_AMBULATORY_CARE_PROVIDER_SITE_OTHER): Payer: Medicare Other | Admitting: Gastroenterology

## 2015-06-11 VITALS — BP 117/59 | HR 68 | Temp 98.4°F | Ht 61.0 in | Wt 176.2 lb

## 2015-06-11 DIAGNOSIS — R1310 Dysphagia, unspecified: Secondary | ICD-10-CM | POA: Diagnosis not present

## 2015-06-11 DIAGNOSIS — R1319 Other dysphagia: Secondary | ICD-10-CM

## 2015-06-11 DIAGNOSIS — R197 Diarrhea, unspecified: Secondary | ICD-10-CM

## 2015-06-11 DIAGNOSIS — R1314 Dysphagia, pharyngoesophageal phase: Secondary | ICD-10-CM | POA: Diagnosis not present

## 2015-06-11 NOTE — Assessment & Plan Note (Signed)
Six-month history of change in bowel habits, frequent profuse diarrhea, debilitating at times. Afraid to leave her house. Remote history of microscopic colitis. Recent workup negative for infectious etiology. Abnormal small bowel biopsies in the remote past but not consistent with celiac.  Recommend EGD with small bowel biopsy and colonoscopy with random colon biopsies in the near future. She will require deep sedation given her prior requirements and polypharmacy.  I have discussed the risks, alternatives, benefits with regards to but not limited to the risk of reaction to medication, bleeding, infection, perforation and the patient is agreeable to proceed. Written consent to be obtained.

## 2015-06-11 NOTE — Patient Instructions (Signed)
1. Colonoscopy and upper endoscopy as scheduled. See separate instructions.  

## 2015-06-11 NOTE — Assessment & Plan Note (Signed)
82 year old female with history of probable cervical esophageal web status post dilation 2012 with long term improvement of her symptoms who presents with recurrent dysphagia over the course of last several months. Predominantly has problems with solids and pills. Feels like food is sticking in her throat. Has a lot of issues with chronic postnasal drip and is unclear how much this is contributing to her symptoms. In addition recently started strangling with liquids, coughing episodes. Recommend upper endoscopy with deep sedation in the OR due to prior requirements and polypharmacy.  I have discussed the risks, alternatives, benefits with regards to but not limited to the risk of reaction to medication, bleeding, infection, perforation and the patient is agreeable to proceed. Written consent to be obtained.

## 2015-06-11 NOTE — Progress Notes (Signed)
Primary Care Physician:  [name]  Primary Gastroenterologist:  Garfield Cornea, MD   Chief Complaint  Patient presents with  . Follow-up    HPI:  [name] is a 82 y.o. female here to reschedule her EGD and colonoscopy. Canceled previously due to palpitations. Subsequently she has been seen by cardiology who has cleared her for procedures.  She has a history of chronic nausea which she feels is related to postnasal drip. She also has worsening dysphagia. Feels like everything is sticking in her throat when she swallows. Has a lot of pressure in her ears and sinuses. Associated coughing at times. Solid foods and pills are difficult to swallow. Last EGD in 2012 with suspected cervical esophageal web status post dilation which helped her for years. Since her last office visit she is noted strangling with liquids.  Colonoscopy has been plan for change in bowel habits. She's had 6 month history of diarrhea at least 4-5 days per week. Has to wear depends due to fecal urgency and incontinence. Afraid to go anywhere. Denies rectal bleeding or melena. Some associated lower abdominal discomfort. Sits on the toilet for hours with diarrhea. Workup has included stool studies which were unremarkable. She is heme-negative. Last colonoscopy 2012 multiple hyperplastic colon polyps, sigmoid diverticulosis, melanosis coli.  Continues to have difficulty emptying her bladder.  2003 she had a colonoscopy with biopsies consistent with microscopic colitis. Previously also had mild chronic duodenitis with retention of the villous architecture from small bowel biopsy in the remote past.  Weight down 10 pounds in the past 12 months.  Current Outpatient Prescriptions  Medication Sig Dispense Refill  . acetaminophen (TYLENOL) 500 MG tablet Take 500 mg by mouth every 6 (six) hours as needed for mild pain.     [name] Kitchen ALPRAZolam (XANAX) 0.25 MG tablet Take 0.25 mg by mouth 2 (two) times daily as needed for  anxiety.     [name] Kitchen aspirin 81 MG EC tablet Take 81 mg by mouth every morning.     . carvedilol (COREG) 12.5 MG tablet TAKE ONE TABLET BY MOUTH TWICE DAILY. 60 tablet 5  . DULoxetine (CYMBALTA) 60 MG capsule Take 60 mg by mouth at bedtime.     . fluticasone (FLONASE) 50 MCG/ACT nasal spray Place 2 sprays into both nostrils daily.    . furosemide (LASIX) 20 MG tablet Take 1 tablet (20 mg total) by mouth daily. 90 tablet 3  . loperamide (IMODIUM A-D) 2 MG tablet Take 2 mg by mouth 4 (four) times daily as needed for diarrhea or loose stools.    . nitroGLYCERIN (NITROSTAT) 0.4 MG SL tablet Place 0.4 mg under the tongue every 5 (five) minutes as needed for chest pain.    . pantoprazole (PROTONIX) 40 MG tablet Take 40 mg by mouth every morning.     . potassium chloride (K-DUR,KLOR-CON) 10 MEQ tablet TAKE TWO (2) TABLETS BY MOUTH DAILY. 60 tablet 6   No current facility-administered medications for this visit.    Allergies as of 06/11/2015 - Review Complete 06/11/2015  Allergen Reaction Noted  . Levaquin [levofloxacin in d5w] Swelling 07/06/2014  . Macrodantin [nitrofurantoin macrocrystal] Swelling 07/06/2014  . Phenothiazines Anaphylaxis and Hives 08/16/2011  . Polysorbate Anaphylaxis 08/16/2011  . Prednisone Shortness Of Breath   . Buspirone Itching 07/06/2014  . Cardura [doxazosin mesylate] Itching 07/06/2014  . Codeine Itching   . Prochlorperazine Other (See Comments) 08/16/2011  . Ranexa [ranolazine]  04/06/2015  . Atorvastatin Hives 08/16/2011  . Ofloxacin Rash   .  Other Itching and Rash 10/10/2012  . Penicillins Other (See Comments) 11/09/2011  . Pimozide Hives and Itching 08/16/2011    Past Medical History  Diagnosis Date  . PVD (peripheral vascular disease) ([place])   . Other and unspecified hyperlipidemia   . Coronary atherosclerosis of native coronary artery     nonobstructive CAD by multiple catheterizations  . Panic disorder without agoraphobia   . HTN (hypertension)     Hx of  it  . MI (myocardial infarction) ([place]) 2006  . Internal hemorrhoids without mention of complication   . Dysphagia, unspecified(787.20)   . Microscopic colitis 2003  . GERD (gastroesophageal reflux disease)     Hx Schatzki's ring, multiple EGD/ED last 01/06/2004  . Hyperlipemia   . Thyroid disease     recent abnl TSH per pt  . S/P colonoscopy 09/27/2001    internal hemorrhoids, desc colon inflam polyp, SB BX-chronic duodenitis, colitis  . Arthritis   . Shortness of breath   . Heart disease   . Bursitis     left shoulder  . Hyperlipidemia   . Sleep disorder     obstructive  . COPD (chronic obstructive pulmonary disease) ([place])   . Complication of anesthesia   . PONV (postoperative nausea and vomiting)     'a little nausea"  . Anxiety   . Depression   . Pneumonia 12/2011  . Heart murmur     'a littel'  . Cataract   . Paresthesia     hands, feet  . CHF (congestive heart failure) [facility])     Past Surgical History  Procedure Laterality Date  . Tonsillectomy    . Unspecified area, hysterectomy  1972    partial  . Cholecystectomy  1998  . Right knee replacement  2007  . Right leg benign tumor    . Breast lumpectomy  1998    left, benign  . Left hand surgery    . Left rotator cuff surgery    . Nasal sinus surgery    . Bladder tack  06/2010  . Maloney dilation  03/16/2011    Gastritis. No H.pylori on bx. 76F maloney dilation with disruption of  occult cevical esophageal web  . Colonoscopy  03/16/2011    multiple hyperplastic colon polyps, sigmoid diverticulosis, melanosis coli  . Bladder suspension  11/09/2011    Procedure: TRANSVAGINAL TAPE (TVT) PROCEDURE;  Surgeon: Marissa Nestle, MD;  Location: [facility];  Service: Urology;  Laterality: N/A;  . Abdominal hysterectomy    . Appendectomy    . Carpal tunnel release  1989    left  . Anterior and posterior repair      with resection of vagina  . Shoulder surgery Left   . Joint replacement Right 2007  . Lumbar  laminectomy/decompression microdiscectomy N/A 10/11/2012    Procedure: LUMBAR LAMINECTOMY/DECOMPRESSION MICRODISCECTOMY 2 LEVELS;  Surgeon: Floyce Stakes, MD;  Location: [facility];  Service: Neurosurgery;  Laterality: N/A;  L3-4 L4-5 Laminectomy  . Left heart catheterization with coronary angiogram N/A 05/14/2013    Procedure: LEFT HEART CATHETERIZATION WITH CORONARY ANGIOGRAM;  Surgeon: Blane Ohara, MD;  Location: [facility];  Service: Cardiovascular;  Laterality: N/A;  . Cardiac catheterization    . Cardiac catheterization    . Coronary angioplasty with stent placement      Family History  Problem Relation Age of Onset  . Coronary artery disease Other     family Hx-sons  . Cancer Other   . Stroke Other  family Hx  . Hypertension Other     family Hx  . Diabetes Brother   . Heart disease Son     before age 92    Social History   Social History  . Marital Status: Divorced    Spouse Name: N/A  . Number of Children: 5  . Years of Education: N/A   Occupational History  . retired    Social History Main Topics  . Smoking status: Former Smoker -- 1.00 packs/day for 64 years    Types: Cigarettes    Start date: 12/24/1947    Quit date: 11/17/2001  . Smokeless tobacco: Never Used     Comment: Quit smoking in 2003  . Alcohol Use: No  . Drug Use: No  . Sexual Activity: No   Other Topics Concern  . Not on file   Social History Narrative   Divorced.   Sister had colon perforation & died from complications in [place], [place]      ROS:  General: Negative for anorexia,  fever, chills, fatigue, weakness. Eyes: Negative for vision changes.  ENT: Negative for hoarseness, nasal congestion. See hpi CV: Negative for chest pain, angina, palpitations, dyspnea on exertion, peripheral edema.  Respiratory: Negative for dyspnea at rest, dyspnea on exertion, cough, sputum, wheezing.  GI: See history of present illness. GU:  Negative for dysuria, hematuria, urinary incontinence,  urinary frequency, nocturnal urination.  MS: Negative for joint pain, low back pain.  Derm: Negative for rash or itching.  Neuro: Negative for weakness, abnormal sensation, seizure, frequent headaches, memory loss, confusion.  Psych: Negative for anxiety, depression, suicidal ideation, hallucinations.  Endo: Negative for unusual weight change.  Heme: Negative for bruising or bleeding. Allergy: Negative for rash or hives.    Physical Examination:  BP 117/59 mmHg  Pulse 68  Temp(Src) 98.4 F (36.9 C) (Oral)  Ht 5\' 1"  (1.549 m)  Wt 176 lb 3.2 oz (79.924 kg)  BMI 33.31 kg/m2   General: Well-nourished, well-developed in no acute distress.  Head: Normocephalic, atraumatic.   Eyes: Conjunctiva pink, no icterus. Mouth: Oropharyngeal mucosa moist and pink , no lesions erythema or exudate. Neck: Supple without thyromegaly, masses, or lymphadenopathy.  Lungs: Clear to auscultation bilaterally.  Heart: Regular rate and rhythm, no murmurs rubs or gallops.  Abdomen: Bowel sounds are normal, nontender, nondistended, no hepatosplenomegaly or masses, no abdominal bruits or    hernia , no rebound or guarding.   Rectal: deferred Extremities: No lower extremity edema. No clubbing or deformities.  Neuro: Alert and oriented x 4 , grossly normal neurologically.  Skin: Warm and dry, no rash or jaundice.   Psych: Alert and cooperative, normal mood and affect.  Labs: Lab Results  Component Value Date   WBC 6.8 02/21/2015   HGB 12.9 02/21/2015   HCT 36.9 02/21/2015   MCV 92.3 02/21/2015   PLT 214 02/21/2015   Lab Results  Component Value Date   CREATININE 1.19* 02/21/2015   BUN 20 02/21/2015   NA 135 02/21/2015   K 3.2* 02/21/2015   CL 99* 02/21/2015   CO2 28 02/21/2015   Lab Results  Component Value Date   ALT 14 02/21/2015   AST 19 02/21/2015   ALKPHOS 57 02/21/2015   BILITOT 0.5 02/21/2015     Imaging Studies: No results found.

## 2015-06-14 NOTE — Progress Notes (Signed)
CC'D TO PCP °

## 2015-06-28 NOTE — Patient Instructions (Signed)
SONYA FEGELY  06/28/2015     @PREFPERIOPPHARMACY @   Your procedure is scheduled on  3/202017   Report to [facility] at  1045  A.M.  Call this number if you have problems the morning of surgery:  [phone]   Remember:  Do not eat food or drink liquids after midnight.  Take these medicines the morning of surgery with A SIP OF WATER  Xanax, carvedilol, cymbalta,protonix.   Do not wear jewelry, make-up or nail polish.  Do not wear lotions, powders, or perfumes.  You may wear deodorant.  Do not shave 48 hours prior to surgery.  Men may shave face and neck.  Do not bring valuables to the hospital.  [facility] is not responsible for any belongings or valuables.  Contacts, dentures or bridgework may not be worn into surgery.  Leave your suitcase in the car.  After surgery it may be brought to your room.  For patients admitted to the hospital, discharge time will be determined by your treatment team.  Patients discharged the day of surgery will not be allowed to drive home.   Name and phone number of your driver:   family Special instructions:  Follow the diet and prep instructions given to you by Dr Roseanne Kaufman office.  Please read over the following fact sheets that you were given. Coughing and Deep Breathing, MRSA Information, Surgical Site Infection Prevention and Anesthesia Post-op Instructions      Esophagogastroduodenoscopy Esophagogastroduodenoscopy (EGD) is a procedure that is used to examine the lining of the esophagus, stomach, and first part of the small intestine (duodenum). A long, flexible, lighted tube with a camera attached (endoscope) is inserted down the throat to view these organs. This procedure is done to detect problems or abnormalities, such as inflammation, bleeding, ulcers, or growths, in order to treat them. The procedure lasts 5-20 minutes. It is usually an outpatient procedure, but it may need to be performed in a hospital in  emergency cases. LET [facility] CARE PROVIDER KNOW ABOUT:  Any allergies you have.  All medicines you are taking, including vitamins, herbs, eye drops, creams, and over-the-counter medicines.  Previous problems you or members of your family have had with the use of anesthetics.  Any blood disorders you have.  Previous surgeries you have had.  Medical conditions you have. RISKS AND COMPLICATIONS Generally, this is a safe procedure. However, problems can occur and include:  Infection.  Bleeding.  Tearing (perforation) of the esophagus, stomach, or duodenum.  Difficulty breathing or not being able to breathe.  Excessive sweating.  Spasms of the larynx.  Slowed heartbeat.  Low blood pressure. BEFORE THE PROCEDURE  Do not eat or drink anything after midnight on the night before the procedure or as directed by your health care provider.  Do not take your regular medicines before the procedure if your health care provider asks you not to. Ask your health care provider about changing or stopping those medicines.  If you wear dentures, be prepared to remove them before the procedure.  Arrange for someone to drive you home after the procedure. PROCEDURE  A numbing medicine (local anesthetic) may be sprayed in your throat for comfort and to stop you from gagging or coughing.  You will have an IV tube inserted in a vein in your hand or arm. You will receive medicines and fluids through this tube.  You will be given a medicine  to relax you (sedative).  A pain reliever will be given through the IV tube.  A mouth guard may be placed in your mouth to protect your teeth and to keep you from biting on the endoscope.  You will be asked to lie on your left side.  The endoscope will be inserted down your throat and into your esophagus, stomach, and duodenum.  Air will be put through the endoscope to allow your health care provider to clearly view the lining of your  esophagus.  The lining of your esophagus, stomach, and duodenum will be examined. During the exam, your health care provider may:  Remove tissue to be examined under a microscope (biopsy) for inflammation, infection, or other medical problems.  Remove growths.  Remove objects (foreign bodies) that are stuck.  Treat any bleeding with medicines or other devices that stop tissues from bleeding (hot cautery, clipping devices).  Widen (dilate) or stretch narrowed areas of your esophagus and stomach.  The endoscope will be withdrawn. AFTER THE PROCEDURE  You will be taken to a recovery area for observation. Your blood pressure, heart rate, breathing rate, and blood oxygen level will be monitored often until the medicines you were given have worn off.  Do not eat or drink anything until the numbing medicine has worn off and your gag reflex has returned. You may choke.  Your health care provider should be able to discuss his or her findings with you. It will take longer to discuss the test results if any biopsies were taken.   This information is not intended to replace advice given to you by your health care provider. Make sure you discuss any questions you have with your health care provider.   Document Released: 08/04/2004 Document Revised: 04/24/2014 Document Reviewed: 03/06/2012 Elsevier Interactive Patient Education 2016 [employer]. Esophagogastroduodenoscopy, Care After Refer to this sheet in the next few weeks. These instructions provide you with information about caring for yourself after your procedure. Your health care provider may also give you more specific instructions. Your treatment has been planned according to current medical practices, but problems sometimes occur. Call your health care provider if you have any problems or questions after your procedure. WHAT TO EXPECT AFTER THE PROCEDURE After your procedure, it is typical to feel:  Soreness in your throat.  Pain with  swallowing.  Sick to your stomach (nauseous).  Bloated.  Dizzy.  Fatigued. HOME CARE INSTRUCTIONS  Do not eat or drink anything until the numbing medicine (local anesthetic) has worn off and your gag reflex has returned. You will know that the local anesthetic has worn off when you can swallow comfortably.  Do not drive or operate machinery until directed by your health care provider.  Take medicines only as directed by your health care provider. SEEK MEDICAL CARE IF:   You cannot stop coughing.  You are not urinating at all or less than usual. SEEK IMMEDIATE MEDICAL CARE IF:  You have difficulty swallowing.  You cannot eat or drink.  You have worsening throat or chest pain.  You have dizziness or lightheadedness or you faint.  You have nausea or vomiting.  You have chills.  You have a fever.  You have severe abdominal pain.  You have black, tarry, or bloody stools.   This information is not intended to replace advice given to you by your health care provider. Make sure you discuss any questions you have with your health care provider.   Document Released: 03/20/2012 Document Revised: 04/24/2014  Document Reviewed: 03/20/2012 Elsevier Interactive Patient Education 2016 [employer] Esophageal Dilatation Esophageal dilatation is a procedure to open a blocked or narrowed part of the esophagus. The esophagus is the long tube in your throat that carries food and liquid from your mouth to your stomach. The procedure is also called esophageal dilation.  You may need this procedure if you have a buildup of scar tissue in your esophagus that makes it difficult, painful, or even impossible to swallow. This can be caused by gastroesophageal reflux disease (GERD). In rare cases, people need this procedure because they have cancer of the esophagus or a problem with the way food moves through the esophagus. Sometimes you may need to have another dilatation to enlarge the opening of  the esophagus gradually. LET [facility] CARE PROVIDER KNOW ABOUT:   Any allergies you have.  All medicines you are taking, including vitamins, herbs, eye drops, creams, and over-the-counter medicines.  Previous problems you or members of your family have had with the use of anesthetics.  Any blood disorders you have.  Previous surgeries you have had.  Medical conditions you have.  Any antibiotic medicines you are required to take before dental procedures. RISKS AND COMPLICATIONS Generally, this is a safe procedure. However, problems can occur and include:  Bleeding from a tear in the lining of the esophagus.  A hole (perforation) in the esophagus. BEFORE THE PROCEDURE  Do not eat or drink anything after midnight on the night before the procedure or as directed by your health care provider.  Ask your health care provider about changing or stopping your regular medicines. This is especially important if you are taking diabetes medicines or blood thinners.  Plan to have someone take you home after the procedure. PROCEDURE   You will be given a medicine that makes you relaxed and sleepy (sedative).  A medicine may be sprayed or gargled to numb the back of the throat.  Your health care provider can use various instruments to do an esophageal dilatation. During the procedure, the instrument used will be placed in your mouth and passed down into your esophagus. Options include:  Simple dilators. This instrument is carefully placed in the esophagus to stretch it.  Guided wire bougies. In this method, a flexible tube (endoscope) is used to insert a wire into the esophagus. The dilator is passed over this wire to enlarge the esophagus. Then the wire is removed.  Balloon dilators. An endoscope with a small balloon at the end is passed down into the esophagus. Inflating the balloon gently stretches the esophagus and opens it up. AFTER THE PROCEDURE  Your blood pressure, heart rate,  breathing rate, and blood oxygen level will be monitored often until the medicines you were given have worn off.  Your throat may feel slightly sore and will probably still feel numb. This will improve slowly over time.  You will not be allowed to eat or drink until the throat numbness has resolved.  If this is a same-day procedure, you may be allowed to go home once you have been able to drink, urinate, and sit on the edge of the bed without nausea or dizziness.  If this is a same-day procedure, you should have a friend or family member with you for the next 24 hours after the procedure.   This information is not intended to replace advice given to you by your health care provider. Make sure you discuss any questions you have with your health care provider.  Document Released: 05/25/2005 Document Revised: 04/24/2014 Document Reviewed: 08/13/2013 Elsevier Interactive Patient Education 2016 [employer]. Colonoscopy A colonoscopy is an exam to look at the entire large intestine (colon). This exam can help find problems such as tumors, polyps, inflammation, and areas of bleeding. The exam takes about 1 hour.  LET [facility] CARE PROVIDER KNOW ABOUT:   Any allergies you have.  All medicines you are taking, including vitamins, herbs, eye drops, creams, and over-the-counter medicines.  Previous problems you or members of your family have had with the use of anesthetics.  Any blood disorders you have.  Previous surgeries you have had.  Medical conditions you have. RISKS AND COMPLICATIONS  Generally, this is a safe procedure. However, as with any procedure, complications can occur. Possible complications include:  Bleeding.  Tearing or rupture of the colon wall.  Reaction to medicines given during the exam.  Infection (rare). BEFORE THE PROCEDURE   Ask your health care provider about changing or stopping your regular medicines.  You may be prescribed an oral bowel prep. This  involves drinking a large amount of medicated liquid, starting the day before your procedure. The liquid will cause you to have multiple loose stools until your stool is almost clear or light green. This cleans out your colon in preparation for the procedure.  Do not eat or drink anything else once you have started the bowel prep, unless your health care provider tells you it is safe to do so.  Arrange for someone to drive you home after the procedure. PROCEDURE   You will be given medicine to help you relax (sedative).  You will lie on your side with your knees bent.  A long, flexible tube with a light and camera on the end (colonoscope) will be inserted through the rectum and into the colon. The camera sends video back to a computer screen as it moves through the colon. The colonoscope also releases carbon dioxide gas to inflate the colon. This helps your health care provider see the area better.  During the exam, your health care provider may take a small tissue sample (biopsy) to be examined under a microscope if any abnormalities are found.  The exam is finished when the entire colon has been viewed. AFTER THE PROCEDURE   Do not drive for 24 hours after the exam.  You may have a small amount of blood in your stool.  You may pass moderate amounts of gas and have mild abdominal cramping or bloating. This is caused by the gas used to inflate your colon during the exam.  Ask when your test results will be ready and how you will get your results. Make sure you get your test results.   This information is not intended to replace advice given to you by your health care provider. Make sure you discuss any questions you have with your health care provider.   Document Released: 03/31/2000 Document Revised: 01/22/2013 Document Reviewed: 12/09/2012 Elsevier Interactive Patient Education 2016 [employer] Colonoscopy, Care After Refer to this sheet in the next few weeks. These instructions  provide you with information on caring for yourself after your procedure. Your health care provider may also give you more specific instructions. Your treatment has been planned according to current medical practices, but problems sometimes occur. Call your health care provider if you have any problems or questions after your procedure. WHAT TO EXPECT AFTER THE PROCEDURE  After your procedure, it is typical to have the following:  A small  amount of blood in your stool.  Moderate amounts of gas and mild abdominal cramping or bloating. HOME CARE INSTRUCTIONS  Do not drive, operate machinery, or sign important documents for 24 hours.  You may shower and resume your regular physical activities, but move at a slower pace for the first 24 hours.  Take frequent rest periods for the first 24 hours.  Walk around or put a warm pack on your abdomen to help reduce abdominal cramping and bloating.  Drink enough fluids to keep your urine clear or pale yellow.  You may resume your normal diet as instructed by your health care provider. Avoid heavy or fried foods that are hard to digest.  Avoid drinking alcohol for 24 hours or as instructed by your health care provider.  Only take over-the-counter or prescription medicines as directed by your health care provider.  If a tissue sample (biopsy) was taken during your procedure:  Do not take aspirin or blood thinners for 7 days, or as instructed by your health care provider.  Do not drink alcohol for 7 days, or as instructed by your health care provider.  Eat soft foods for the first 24 hours. SEEK MEDICAL CARE IF: You have persistent spotting of blood in your stool 2-3 days after the procedure. SEEK IMMEDIATE MEDICAL CARE IF:  You have more than a small spotting of blood in your stool.  You pass large blood clots in your stool.  Your abdomen is swollen (distended).  You have nausea or vomiting.  You have a fever.  You have increasing  abdominal pain that is not relieved with medicine.   This information is not intended to replace advice given to you by your health care provider. Make sure you discuss any questions you have with your health care provider.   Document Released: 11/16/2003 Document Revised: 01/22/2013 Document Reviewed: 12/09/2012 Elsevier Interactive Patient Education 2016 [employer] PATIENT INSTRUCTIONS POST-ANESTHESIA  IMMEDIATELY FOLLOWING SURGERY:  Do not drive or operate machinery for the first twenty four hours after surgery.  Do not make any important decisions for twenty four hours after surgery or while taking narcotic pain medications or sedatives.  If you develop intractable nausea and vomiting or a severe headache please notify your doctor immediately.  FOLLOW-UP:  Please make an appointment with your surgeon as instructed. You do not need to follow up with anesthesia unless specifically instructed to do so.  WOUND CARE INSTRUCTIONS (if applicable):  Keep a dry clean dressing on the anesthesia/puncture wound site if there is drainage.  Once the wound has quit draining you may leave it open to air.  Generally you should leave the bandage intact for twenty four hours unless there is drainage.  If the epidural site drains for more than 36-48 hours please call the anesthesia department.  QUESTIONS?:  Please feel free to call your physician or the hospital operator if you have any questions, and they will be happy to assist you.

## 2015-06-29 ENCOUNTER — Encounter (HOSPITAL_COMMUNITY): Payer: Self-pay

## 2015-06-29 ENCOUNTER — Other Ambulatory Visit: Payer: Self-pay

## 2015-06-29 ENCOUNTER — Encounter (HOSPITAL_COMMUNITY)
Admission: RE | Admit: 2015-06-29 | Discharge: 2015-06-29 | Disposition: A | Discharge status: Home / Self Care | Payer: Medicare Other | Source: Ambulatory Visit | Attending: Internal Medicine | Admitting: Internal Medicine

## 2015-06-29 DIAGNOSIS — Z01810 Encounter for preprocedural cardiovascular examination: Secondary | ICD-10-CM | POA: Diagnosis present

## 2015-06-29 DIAGNOSIS — Z01812 Encounter for preprocedural laboratory examination: Secondary | ICD-10-CM | POA: Diagnosis not present

## 2015-06-29 LAB — CBC WITH DIFFERENTIAL/PLATELET
BASOS PCT: 0 %
Basophils Absolute: 0 10*3/uL (ref 0.0–0.1)
EOS ABS: 0.2 10*3/uL (ref 0.0–0.7)
Eosinophils Relative: 3 %
HCT: 37.5 % (ref 36.0–46.0)
Hemoglobin: 13 g/dL (ref 12.0–15.0)
Lymphocytes Relative: 34 %
Lymphs Abs: 2.6 10*3/uL (ref 0.7–4.0)
MCH: 31.9 pg (ref 26.0–34.0)
MCHC: 34.7 g/dL (ref 30.0–36.0)
MCV: 92.1 fL (ref 78.0–100.0)
MONOS PCT: 8 %
Monocytes Absolute: 0.6 10*3/uL (ref 0.1–1.0)
NEUTROS PCT: 55 %
Neutro Abs: 4.1 10*3/uL (ref 1.7–7.7)
Platelets: 239 10*3/uL (ref 150–400)
RBC: 4.07 MIL/uL (ref 3.87–5.11)
RDW: 13.6 % (ref 11.5–15.5)
WBC: 7.6 10*3/uL (ref 4.0–10.5)

## 2015-06-29 LAB — BASIC METABOLIC PANEL
Anion gap: 7 (ref 5–15)
BUN: 17 mg/dL (ref 6–20)
CO2: 25 mmol/L (ref 22–32)
Calcium: 8.7 mg/dL — ABNORMAL LOW (ref 8.9–10.3)
Chloride: 107 mmol/L (ref 101–111)
Creatinine, Ser: 1.07 mg/dL — ABNORMAL HIGH (ref 0.44–1.00)
GFR calc non Af Amer: 48 mL/min — ABNORMAL LOW (ref >60)
GFR, EST AFRICAN AMERICAN: 55 mL/min — AB (ref >60)
GLUCOSE: 98 mg/dL (ref 65–99)
Potassium: 3.9 mmol/L (ref 3.5–5.1)
SODIUM: 139 mmol/L (ref 135–145)

## 2015-06-29 NOTE — Pre-Procedure Instructions (Signed)
Patient given information to sign up for my chart at home. 

## 2015-07-05 ENCOUNTER — Encounter (HOSPITAL_COMMUNITY): Payer: Self-pay | Admitting: *Deleted

## 2015-07-05 ENCOUNTER — Ambulatory Visit (HOSPITAL_COMMUNITY): Payer: Medicare Other | Admitting: Anesthesiology

## 2015-07-05 ENCOUNTER — Ambulatory Visit (HOSPITAL_COMMUNITY)
Admission: RE | Admit: 2015-07-05 | Discharge: 2015-07-05 | Disposition: A | Discharge status: Home / Self Care | Payer: Medicare Other | Source: Ambulatory Visit | Attending: Internal Medicine | Admitting: Internal Medicine

## 2015-07-05 ENCOUNTER — Encounter (HOSPITAL_COMMUNITY)
Admission: RE | Disposition: A | Discharge status: Home / Self Care | Payer: Self-pay | Source: Ambulatory Visit | Attending: Internal Medicine

## 2015-07-05 DIAGNOSIS — I2510 Atherosclerotic heart disease of native coronary artery without angina pectoris: Secondary | ICD-10-CM | POA: Diagnosis not present

## 2015-07-05 DIAGNOSIS — F419 Anxiety disorder, unspecified: Secondary | ICD-10-CM | POA: Diagnosis not present

## 2015-07-05 DIAGNOSIS — F329 Major depressive disorder, single episode, unspecified: Secondary | ICD-10-CM | POA: Diagnosis not present

## 2015-07-05 DIAGNOSIS — R1319 Other dysphagia: Secondary | ICD-10-CM | POA: Diagnosis not present

## 2015-07-05 DIAGNOSIS — R109 Unspecified abdominal pain: Secondary | ICD-10-CM | POA: Diagnosis not present

## 2015-07-05 DIAGNOSIS — I110 Hypertensive heart disease with heart failure: Secondary | ICD-10-CM | POA: Insufficient documentation

## 2015-07-05 DIAGNOSIS — Z7982 Long term (current) use of aspirin: Secondary | ICD-10-CM | POA: Insufficient documentation

## 2015-07-05 DIAGNOSIS — I509 Heart failure, unspecified: Secondary | ICD-10-CM | POA: Diagnosis not present

## 2015-07-05 DIAGNOSIS — D125 Benign neoplasm of sigmoid colon: Secondary | ICD-10-CM | POA: Insufficient documentation

## 2015-07-05 DIAGNOSIS — Z7951 Long term (current) use of inhaled steroids: Secondary | ICD-10-CM | POA: Insufficient documentation

## 2015-07-05 DIAGNOSIS — K529 Noninfective gastroenteritis and colitis, unspecified: Secondary | ICD-10-CM | POA: Insufficient documentation

## 2015-07-05 DIAGNOSIS — K219 Gastro-esophageal reflux disease without esophagitis: Secondary | ICD-10-CM | POA: Insufficient documentation

## 2015-07-05 DIAGNOSIS — R1310 Dysphagia, unspecified: Secondary | ICD-10-CM | POA: Diagnosis not present

## 2015-07-05 DIAGNOSIS — D122 Benign neoplasm of ascending colon: Secondary | ICD-10-CM | POA: Insufficient documentation

## 2015-07-05 DIAGNOSIS — D124 Benign neoplasm of descending colon: Secondary | ICD-10-CM | POA: Insufficient documentation

## 2015-07-05 DIAGNOSIS — Z87891 Personal history of nicotine dependence: Secondary | ICD-10-CM | POA: Diagnosis not present

## 2015-07-05 DIAGNOSIS — Z79899 Other long term (current) drug therapy: Secondary | ICD-10-CM | POA: Diagnosis not present

## 2015-07-05 DIAGNOSIS — E785 Hyperlipidemia, unspecified: Secondary | ICD-10-CM | POA: Insufficient documentation

## 2015-07-05 DIAGNOSIS — K317 Polyp of stomach and duodenum: Secondary | ICD-10-CM | POA: Insufficient documentation

## 2015-07-05 DIAGNOSIS — J449 Chronic obstructive pulmonary disease, unspecified: Secondary | ICD-10-CM | POA: Diagnosis not present

## 2015-07-05 DIAGNOSIS — Z86010 Personal history of colonic polyps: Secondary | ICD-10-CM | POA: Insufficient documentation

## 2015-07-05 DIAGNOSIS — R197 Diarrhea, unspecified: Secondary | ICD-10-CM | POA: Diagnosis not present

## 2015-07-05 HISTORY — PX: BIOPSY: SHX5522

## 2015-07-05 HISTORY — PX: COLONOSCOPY WITH PROPOFOL: SHX5780

## 2015-07-05 HISTORY — PX: ESOPHAGEAL DILATION: SHX303

## 2015-07-05 HISTORY — PX: ESOPHAGOGASTRODUODENOSCOPY (EGD) WITH PROPOFOL: SHX5813

## 2015-07-05 SURGERY — COLONOSCOPY WITH PROPOFOL
Anesthesia: Monitor Anesthesia Care

## 2015-07-05 MED ORDER — FENTANYL CITRATE (PF) 100 MCG/2ML IJ SOLN
INTRAMUSCULAR | Status: AC
  Filled 2015-07-05: qty 2

## 2015-07-05 MED ORDER — MIDAZOLAM HCL 2 MG/2ML IJ SOLN
INTRAMUSCULAR | Status: AC
  Filled 2015-07-05: qty 2

## 2015-07-05 MED ORDER — FENTANYL CITRATE (PF) 100 MCG/2ML IJ SOLN
25.0000 ug | Freq: Once | INTRAMUSCULAR | Status: AC
  Administered 2015-07-05: 25 ug via INTRAVENOUS

## 2015-07-05 MED ORDER — LIDOCAINE VISCOUS 2 % MT SOLN
5.0000 mL | Freq: Two times a day (BID) | OROMUCOSAL | Status: AC
  Administered 2015-07-05 (×2): 5 mL via OROMUCOSAL

## 2015-07-05 MED ORDER — PROPOFOL 500 MG/50ML IV EMUL
INTRAVENOUS | Status: DC | PRN
  Administered 2015-07-05: 125 ug/kg/min via INTRAVENOUS

## 2015-07-05 MED ORDER — MIDAZOLAM HCL 2 MG/2ML IJ SOLN
1.0000 mg | INTRAMUSCULAR | Status: DC | PRN
  Administered 2015-07-05 (×2): 2 mg via INTRAVENOUS

## 2015-07-05 MED ORDER — PROPOFOL 10 MG/ML IV BOLUS
INTRAVENOUS | Status: AC
  Filled 2015-07-05: qty 20

## 2015-07-05 MED ORDER — LIDOCAINE VISCOUS 2 % MT SOLN
OROMUCOSAL | Status: AC
  Filled 2015-07-05: qty 15

## 2015-07-05 MED ORDER — LACTATED RINGERS IV SOLN
INTRAVENOUS | Status: DC
  Administered 2015-07-05: 12:00:00 via INTRAVENOUS

## 2015-07-05 MED ORDER — FENTANYL CITRATE (PF) 100 MCG/2ML IJ SOLN: 25.0000 ug | INTRAMUSCULAR | Status: DC | PRN

## 2015-07-05 MED ORDER — ONDANSETRON HCL 4 MG/2ML IJ SOLN: 4.0000 mg | Freq: Once | INTRAMUSCULAR | Status: DC | PRN

## 2015-07-05 NOTE — H&P (View-Only) (Signed)
Primary Care Physician:  [name]  Primary Gastroenterologist:  Garfield Cornea, MD   Chief Complaint  Patient presents with  . Follow-up    HPI:  [name] is a 82 y.o. female here to reschedule her EGD and colonoscopy. Canceled previously due to palpitations. Subsequently she has been seen by cardiology who has cleared her for procedures.  She has a history of chronic nausea which she feels is related to postnasal drip. She also has worsening dysphagia. Feels like everything is sticking in her throat when she swallows. Has a lot of pressure in her ears and sinuses. Associated coughing at times. Solid foods and pills are difficult to swallow. Last EGD in 2012 with suspected cervical esophageal web status post dilation which helped her for years. Since her last office visit she is noted strangling with liquids.  Colonoscopy has been plan for change in bowel habits. She's had 6 month history of diarrhea at least 4-5 days per week. Has to wear depends due to fecal urgency and incontinence. Afraid to go anywhere. Denies rectal bleeding or melena. Some associated lower abdominal discomfort. Sits on the toilet for hours with diarrhea. Workup has included stool studies which were unremarkable. She is heme-negative. Last colonoscopy 2012 multiple hyperplastic colon polyps, sigmoid diverticulosis, melanosis coli.  Continues to have difficulty emptying her bladder.  2003 she had a colonoscopy with biopsies consistent with microscopic colitis. Previously also had mild chronic duodenitis with retention of the villous architecture from small bowel biopsy in the remote past.  Weight down 10 pounds in the past 12 months.  Current Outpatient Prescriptions  Medication Sig Dispense Refill  . acetaminophen (TYLENOL) 500 MG tablet Take 500 mg by mouth every 6 (six) hours as needed for mild pain.     [name] Kitchen ALPRAZolam (XANAX) 0.25 MG tablet Take 0.25 mg by mouth 2 (two) times daily as needed for  anxiety.     [name] Kitchen aspirin 81 MG EC tablet Take 81 mg by mouth every morning.     . carvedilol (COREG) 12.5 MG tablet TAKE ONE TABLET BY MOUTH TWICE DAILY. 60 tablet 5  . DULoxetine (CYMBALTA) 60 MG capsule Take 60 mg by mouth at bedtime.     . fluticasone (FLONASE) 50 MCG/ACT nasal spray Place 2 sprays into both nostrils daily.    . furosemide (LASIX) 20 MG tablet Take 1 tablet (20 mg total) by mouth daily. 90 tablet 3  . loperamide (IMODIUM A-D) 2 MG tablet Take 2 mg by mouth 4 (four) times daily as needed for diarrhea or loose stools.    . nitroGLYCERIN (NITROSTAT) 0.4 MG SL tablet Place 0.4 mg under the tongue every 5 (five) minutes as needed for chest pain.    . pantoprazole (PROTONIX) 40 MG tablet Take 40 mg by mouth every morning.     . potassium chloride (K-DUR,KLOR-CON) 10 MEQ tablet TAKE TWO (2) TABLETS BY MOUTH DAILY. 60 tablet 6   No current facility-administered medications for this visit.    Allergies as of 06/11/2015 - Review Complete 06/11/2015  Allergen Reaction Noted  . Levaquin [levofloxacin in d5w] Swelling 07/06/2014  . Macrodantin [nitrofurantoin macrocrystal] Swelling 07/06/2014  . Phenothiazines Anaphylaxis and Hives 08/16/2011  . Polysorbate Anaphylaxis 08/16/2011  . Prednisone Shortness Of Breath   . Buspirone Itching 07/06/2014  . Cardura [doxazosin mesylate] Itching 07/06/2014  . Codeine Itching   . Prochlorperazine Other (See Comments) 08/16/2011  . Ranexa [ranolazine]  04/06/2015  . Atorvastatin Hives 08/16/2011  . Ofloxacin Rash   .  Other Itching and Rash 10/10/2012  . Penicillins Other (See Comments) 11/09/2011  . Pimozide Hives and Itching 08/16/2011    Past Medical History  Diagnosis Date  . PVD (peripheral vascular disease) ([place])   . Other and unspecified hyperlipidemia   . Coronary atherosclerosis of native coronary artery     nonobstructive CAD by multiple catheterizations  . Panic disorder without agoraphobia   . HTN (hypertension)     Hx of  it  . MI (myocardial infarction) ([place]) 2006  . Internal hemorrhoids without mention of complication   . Dysphagia, unspecified(787.20)   . Microscopic colitis 2003  . GERD (gastroesophageal reflux disease)     Hx Schatzki's ring, multiple EGD/ED last 01/06/2004  . Hyperlipemia   . Thyroid disease     recent abnl TSH per pt  . S/P colonoscopy 09/27/2001    internal hemorrhoids, desc colon inflam polyp, SB BX-chronic duodenitis, colitis  . Arthritis   . Shortness of breath   . Heart disease   . Bursitis     left shoulder  . Hyperlipidemia   . Sleep disorder     obstructive  . COPD (chronic obstructive pulmonary disease) ([place])   . Complication of anesthesia   . PONV (postoperative nausea and vomiting)     'a little nausea"  . Anxiety   . Depression   . Pneumonia 12/2011  . Heart murmur     'a littel'  . Cataract   . Paresthesia     hands, feet  . CHF (congestive heart failure) [facility])     Past Surgical History  Procedure Laterality Date  . Tonsillectomy    . Unspecified area, hysterectomy  1972    partial  . Cholecystectomy  1998  . Right knee replacement  2007  . Right leg benign tumor    . Breast lumpectomy  1998    left, benign  . Left hand surgery    . Left rotator cuff surgery    . Nasal sinus surgery    . Bladder tack  06/2010  . Maloney dilation  03/16/2011    Gastritis. No H.pylori on bx. 40F maloney dilation with disruption of  occult cevical esophageal web  . Colonoscopy  03/16/2011    multiple hyperplastic colon polyps, sigmoid diverticulosis, melanosis coli  . Bladder suspension  11/09/2011    Procedure: TRANSVAGINAL TAPE (TVT) PROCEDURE;  Surgeon: Marissa Nestle, MD;  Location: [facility];  Service: Urology;  Laterality: N/A;  . Abdominal hysterectomy    . Appendectomy    . Carpal tunnel release  1989    left  . Anterior and posterior repair      with resection of vagina  . Shoulder surgery Left   . Joint replacement Right 2007  . Lumbar  laminectomy/decompression microdiscectomy N/A 10/11/2012    Procedure: LUMBAR LAMINECTOMY/DECOMPRESSION MICRODISCECTOMY 2 LEVELS;  Surgeon: Floyce Stakes, MD;  Location: [place] NEURO ORS;  Service: Neurosurgery;  Laterality: N/A;  L3-4 L4-5 Laminectomy  . Left heart catheterization with coronary angiogram N/A 05/14/2013    Procedure: LEFT HEART CATHETERIZATION WITH CORONARY ANGIOGRAM;  Surgeon: Blane Ohara, MD;  Location: [facility] CATH LAB;  Service: Cardiovascular;  Laterality: N/A;  . Cardiac catheterization    . Cardiac catheterization    . Coronary angioplasty with stent placement      Family History  Problem Relation Age of Onset  . Coronary artery disease Other     family Hx-sons  . Cancer Other   . Stroke Other  family Hx  . Hypertension Other     family Hx  . Diabetes Brother   . Heart disease Son     before age 72    Social History   Social History  . Marital Status: Divorced    Spouse Name: N/A  . Number of Children: 5  . Years of Education: N/A   Occupational History  . retired    Social History Main Topics  . Smoking status: Former Smoker -- 1.00 packs/day for 64 years    Types: Cigarettes    Start date: 12/24/1947    Quit date: 11/17/2001  . Smokeless tobacco: Never Used     Comment: Quit smoking in 2003  . Alcohol Use: No  . Drug Use: No  . Sexual Activity: No   Other Topics Concern  . Not on file   Social History Narrative   Divorced.   Sister had colon perforation & died from complications in [place], [place]      ROS:  General: Negative for anorexia,  fever, chills, fatigue, weakness. Eyes: Negative for vision changes.  ENT: Negative for hoarseness, nasal congestion. See hpi CV: Negative for chest pain, angina, palpitations, dyspnea on exertion, peripheral edema.  Respiratory: Negative for dyspnea at rest, dyspnea on exertion, cough, sputum, wheezing.  GI: See history of present illness. GU:  Negative for dysuria, hematuria, urinary incontinence,  urinary frequency, nocturnal urination.  MS: Negative for joint pain, low back pain.  Derm: Negative for rash or itching.  Neuro: Negative for weakness, abnormal sensation, seizure, frequent headaches, memory loss, confusion.  Psych: Negative for anxiety, depression, suicidal ideation, hallucinations.  Endo: Negative for unusual weight change.  Heme: Negative for bruising or bleeding. Allergy: Negative for rash or hives.    Physical Examination:  BP 117/59 mmHg  Pulse 68  Temp(Src) 98.4 F (36.9 C) (Oral)  Ht 5\' 1"  (1.549 m)  Wt 176 lb 3.2 oz (79.924 kg)  BMI 33.31 kg/m2   General: Well-nourished, well-developed in no acute distress.  Head: Normocephalic, atraumatic.   Eyes: Conjunctiva pink, no icterus. Mouth: Oropharyngeal mucosa moist and pink , no lesions erythema or exudate. Neck: Supple without thyromegaly, masses, or lymphadenopathy.  Lungs: Clear to auscultation bilaterally.  Heart: Regular rate and rhythm, no murmurs rubs or gallops.  Abdomen: Bowel sounds are normal, nontender, nondistended, no hepatosplenomegaly or masses, no abdominal bruits or    hernia , no rebound or guarding.   Rectal: deferred Extremities: No lower extremity edema. No clubbing or deformities.  Neuro: Alert and oriented x 4 , grossly normal neurologically.  Skin: Warm and dry, no rash or jaundice.   Psych: Alert and cooperative, normal mood and affect.  Labs: Lab Results  Component Value Date   WBC 6.8 02/21/2015   HGB 12.9 02/21/2015   HCT 36.9 02/21/2015   MCV 92.3 02/21/2015   PLT 214 02/21/2015   Lab Results  Component Value Date   CREATININE 1.19* 02/21/2015   BUN 20 02/21/2015   NA 135 02/21/2015   K 3.2* 02/21/2015   CL 99* 02/21/2015   CO2 28 02/21/2015   Lab Results  Component Value Date   ALT 14 02/21/2015   AST 19 02/21/2015   ALKPHOS 57 02/21/2015   BILITOT 0.5 02/21/2015     Imaging Studies: No results found.

## 2015-07-05 NOTE — Interval H&P Note (Signed)
History and Physical Interval Note:  07/05/2015 2:49 PM  [name]  has presented today for surgery, with the diagnosis of chronic diarrhea, lower abdominal pain, esophageal dysphagia  The various methods of treatment have been discussed with the patient and family. After consideration of risks, benefits and other options for treatment, the patient has consented to  Procedure(s) with comments: COLONOSCOPY WITH PROPOFOL (N/A) - 12:15 ESOPHAGOGASTRODUODENOSCOPY (EGD) WITH PROPOFOL (N/A) ESOPHAGEAL DILATION (N/A) BIOPSY - gastric polyp biopsy, ascending colon biopsy as a surgical intervention .  The patient's history has been reviewed, patient examined, no change in status, stable for surgery.  I have reviewed the patient's chart and labs.  Questions were answered to the patient's satisfaction.     [name]  No change; EGD w ED and TCS per plan.  The risks, benefits, limitations, imponderables and alternatives regarding both EGD and colonoscopy have been reviewed with the patient. Questions have been answered. All parties agreeable.

## 2015-07-05 NOTE — Anesthesia Postprocedure Evaluation (Signed)
Anesthesia Post Note  Patient: [name]  Procedure(s) Performed: Procedure(s) (LRB): COLONOSCOPY WITH PROPOFOL (N/A) ESOPHAGOGASTRODUODENOSCOPY (EGD) WITH PROPOFOL (N/A) ESOPHAGEAL DILATION (N/A) BIOPSY  Patient location during evaluation: PACU Anesthesia Type: MAC Level of consciousness: awake and alert and patient uncooperative Pain management: pain level controlled Vital Signs Assessment: post-procedure vital signs reviewed and stable Respiratory status: spontaneous breathing Cardiovascular status: blood pressure returned to baseline Postop Assessment: no signs of nausea or vomiting Anesthetic complications: no    Last Vitals:  Filed Vitals:   07/05/15 1419 07/05/15 1430  BP: 147/66 154/64  Pulse: 64 62  Temp: 36.5 C   Resp: 14 13    Last Pain: There were no vitals filed for this visit.               Roniel Halloran

## 2015-07-05 NOTE — Progress Notes (Signed)
Awake. Swallowing without difficulty. Coke given to drink. Tolerated well.

## 2015-07-05 NOTE — Transfer of Care (Signed)
Immediate Anesthesia Transfer of Care Note  Patient: [name]  Procedure(s) Performed: Procedure(s) with comments: COLONOSCOPY WITH PROPOFOL (N/A) - 12:15 ESOPHAGOGASTRODUODENOSCOPY (EGD) WITH PROPOFOL (N/A) ESOPHAGEAL DILATION (N/A) BIOPSY - gastric polyp biopsy, ascending colon biopsy  Patient Location: PACU  Anesthesia Type:MAC  Level of Consciousness: sedated  Airway & Oxygen Therapy: Patient Spontanous Breathing and Patient connected to nasal cannula oxygen  Post-op Assessment: Report given to RN  Post vital signs: Reviewed and stable  Last Vitals:  Filed Vitals:   07/05/15 1320 07/05/15 1325  BP: 144/61 140/64  Pulse:    Temp:    Resp: 21 19    Complications: No apparent anesthesia complications

## 2015-07-05 NOTE — H&P (View-Only) (Signed)
Primary Care Physician:  [name]  Primary Gastroenterologist:  Garfield Cornea, MD   Chief Complaint  Patient presents with  . Follow-up    HPI:  [name] is a 82 y.o. female here to reschedule her EGD and colonoscopy. Canceled previously due to palpitations. Subsequently she has been seen by cardiology who has cleared her for procedures.  She has a history of chronic nausea which she feels is related to postnasal drip. She also has worsening dysphagia. Feels like everything is sticking in her throat when she swallows. Has a lot of pressure in her ears and sinuses. Associated coughing at times. Solid foods and pills are difficult to swallow. Last EGD in 2012 with suspected cervical esophageal web status post dilation which helped her for years. Since her last office visit she is noted strangling with liquids.  Colonoscopy has been plan for change in bowel habits. She's had 6 month history of diarrhea at least 4-5 days per week. Has to wear depends due to fecal urgency and incontinence. Afraid to go anywhere. Denies rectal bleeding or melena. Some associated lower abdominal discomfort. Sits on the toilet for hours with diarrhea. Workup has included stool studies which were unremarkable. She is heme-negative. Last colonoscopy 2012 multiple hyperplastic colon polyps, sigmoid diverticulosis, melanosis coli.  Continues to have difficulty emptying her bladder.  2003 she had a colonoscopy with biopsies consistent with microscopic colitis. Previously also had mild chronic duodenitis with retention of the villous architecture from small bowel biopsy in the remote past.  Weight down 10 pounds in the past 12 months.  Current Outpatient Prescriptions  Medication Sig Dispense Refill  . acetaminophen (TYLENOL) 500 MG tablet Take 500 mg by mouth every 6 (six) hours as needed for mild pain.     [name] Kitchen ALPRAZolam (XANAX) 0.25 MG tablet Take 0.25 mg by mouth 2 (two) times daily as needed for  anxiety.     [name] Kitchen aspirin 81 MG EC tablet Take 81 mg by mouth every morning.     . carvedilol (COREG) 12.5 MG tablet TAKE ONE TABLET BY MOUTH TWICE DAILY. 60 tablet 5  . DULoxetine (CYMBALTA) 60 MG capsule Take 60 mg by mouth at bedtime.     . fluticasone (FLONASE) 50 MCG/ACT nasal spray Place 2 sprays into both nostrils daily.    . furosemide (LASIX) 20 MG tablet Take 1 tablet (20 mg total) by mouth daily. 90 tablet 3  . loperamide (IMODIUM A-D) 2 MG tablet Take 2 mg by mouth 4 (four) times daily as needed for diarrhea or loose stools.    . nitroGLYCERIN (NITROSTAT) 0.4 MG SL tablet Place 0.4 mg under the tongue every 5 (five) minutes as needed for chest pain.    . pantoprazole (PROTONIX) 40 MG tablet Take 40 mg by mouth every morning.     . potassium chloride (K-DUR,KLOR-CON) 10 MEQ tablet TAKE TWO (2) TABLETS BY MOUTH DAILY. 60 tablet 6   No current facility-administered medications for this visit.    Allergies as of 06/11/2015 - Review Complete 06/11/2015  Allergen Reaction Noted  . Levaquin [levofloxacin in d5w] Swelling 07/06/2014  . Macrodantin [nitrofurantoin macrocrystal] Swelling 07/06/2014  . Phenothiazines Anaphylaxis and Hives 08/16/2011  . Polysorbate Anaphylaxis 08/16/2011  . Prednisone Shortness Of Breath   . Buspirone Itching 07/06/2014  . Cardura [doxazosin mesylate] Itching 07/06/2014  . Codeine Itching   . Prochlorperazine Other (See Comments) 08/16/2011  . Ranexa [ranolazine]  04/06/2015  . Atorvastatin Hives 08/16/2011  . Ofloxacin Rash   .  Other Itching and Rash 10/10/2012  . Penicillins Other (See Comments) 11/09/2011  . Pimozide Hives and Itching 08/16/2011    Past Medical History  Diagnosis Date  . PVD (peripheral vascular disease) ([place])   . Other and unspecified hyperlipidemia   . Coronary atherosclerosis of native coronary artery     nonobstructive CAD by multiple catheterizations  . Panic disorder without agoraphobia   . HTN (hypertension)     Hx of  it  . MI (myocardial infarction) ([place]) 2006  . Internal hemorrhoids without mention of complication   . Dysphagia, unspecified(787.20)   . Microscopic colitis 2003  . GERD (gastroesophageal reflux disease)     Hx Schatzki's ring, multiple EGD/ED last 01/06/2004  . Hyperlipemia   . Thyroid disease     recent abnl TSH per pt  . S/P colonoscopy 09/27/2001    internal hemorrhoids, desc colon inflam polyp, SB BX-chronic duodenitis, colitis  . Arthritis   . Shortness of breath   . Heart disease   . Bursitis     left shoulder  . Hyperlipidemia   . Sleep disorder     obstructive  . COPD (chronic obstructive pulmonary disease) ([place])   . Complication of anesthesia   . PONV (postoperative nausea and vomiting)     'a little nausea"  . Anxiety   . Depression   . Pneumonia 12/2011  . Heart murmur     'a littel'  . Cataract   . Paresthesia     hands, feet  . CHF (congestive heart failure) [facility])     Past Surgical History  Procedure Laterality Date  . Tonsillectomy    . Unspecified area, hysterectomy  1972    partial  . Cholecystectomy  1998  . Right knee replacement  2007  . Right leg benign tumor    . Breast lumpectomy  1998    left, benign  . Left hand surgery    . Left rotator cuff surgery    . Nasal sinus surgery    . Bladder tack  06/2010  . Maloney dilation  03/16/2011    Gastritis. No H.pylori on bx. 18F maloney dilation with disruption of  occult cevical esophageal web  . Colonoscopy  03/16/2011    multiple hyperplastic colon polyps, sigmoid diverticulosis, melanosis coli  . Bladder suspension  11/09/2011    Procedure: TRANSVAGINAL TAPE (TVT) PROCEDURE;  Surgeon: Marissa Nestle, MD;  Location: [facility];  Service: Urology;  Laterality: N/A;  . Abdominal hysterectomy    . Appendectomy    . Carpal tunnel release  1989    left  . Anterior and posterior repair      with resection of vagina  . Shoulder surgery Left   . Joint replacement Right 2007  . Lumbar  laminectomy/decompression microdiscectomy N/A 10/11/2012    Procedure: LUMBAR LAMINECTOMY/DECOMPRESSION MICRODISCECTOMY 2 LEVELS;  Surgeon: Floyce Stakes, MD;  Location: [place] NEURO ORS;  Service: Neurosurgery;  Laterality: N/A;  L3-4 L4-5 Laminectomy  . Left heart catheterization with coronary angiogram N/A 05/14/2013    Procedure: LEFT HEART CATHETERIZATION WITH CORONARY ANGIOGRAM;  Surgeon: Blane Ohara, MD;  Location: [facility] CATH LAB;  Service: Cardiovascular;  Laterality: N/A;  . Cardiac catheterization    . Cardiac catheterization    . Coronary angioplasty with stent placement      Family History  Problem Relation Age of Onset  . Coronary artery disease Other     family Hx-sons  . Cancer Other   . Stroke Other  family Hx  . Hypertension Other     family Hx  . Diabetes Brother   . Heart disease Son     before age 40    Social History   Social History  . Marital Status: Divorced    Spouse Name: N/A  . Number of Children: 5  . Years of Education: N/A   Occupational History  . retired    Social History Main Topics  . Smoking status: Former Smoker -- 1.00 packs/day for 64 years    Types: Cigarettes    Start date: 12/24/1947    Quit date: 11/17/2001  . Smokeless tobacco: Never Used     Comment: Quit smoking in 2003  . Alcohol Use: No  . Drug Use: No  . Sexual Activity: No   Other Topics Concern  . Not on file   Social History Narrative   Divorced.   Sister had colon perforation & died from complications in [place], [place]      ROS:  General: Negative for anorexia,  fever, chills, fatigue, weakness. Eyes: Negative for vision changes.  ENT: Negative for hoarseness, nasal congestion. See hpi CV: Negative for chest pain, angina, palpitations, dyspnea on exertion, peripheral edema.  Respiratory: Negative for dyspnea at rest, dyspnea on exertion, cough, sputum, wheezing.  GI: See history of present illness. GU:  Negative for dysuria, hematuria, urinary incontinence,  urinary frequency, nocturnal urination.  MS: Negative for joint pain, low back pain.  Derm: Negative for rash or itching.  Neuro: Negative for weakness, abnormal sensation, seizure, frequent headaches, memory loss, confusion.  Psych: Negative for anxiety, depression, suicidal ideation, hallucinations.  Endo: Negative for unusual weight change.  Heme: Negative for bruising or bleeding. Allergy: Negative for rash or hives.    Physical Examination:  BP 117/59 mmHg  Pulse 68  Temp(Src) 98.4 F (36.9 C) (Oral)  Ht 5\' 1"  (1.549 m)  Wt 176 lb 3.2 oz (79.924 kg)  BMI 33.31 kg/m2   General: Well-nourished, well-developed in no acute distress.  Head: Normocephalic, atraumatic.   Eyes: Conjunctiva pink, no icterus. Mouth: Oropharyngeal mucosa moist and pink , no lesions erythema or exudate. Neck: Supple without thyromegaly, masses, or lymphadenopathy.  Lungs: Clear to auscultation bilaterally.  Heart: Regular rate and rhythm, no murmurs rubs or gallops.  Abdomen: Bowel sounds are normal, nontender, nondistended, no hepatosplenomegaly or masses, no abdominal bruits or    hernia , no rebound or guarding.   Rectal: deferred Extremities: No lower extremity edema. No clubbing or deformities.  Neuro: Alert and oriented x 4 , grossly normal neurologically.  Skin: Warm and dry, no rash or jaundice.   Psych: Alert and cooperative, normal mood and affect.  Labs: Lab Results  Component Value Date   WBC 6.8 02/21/2015   HGB 12.9 02/21/2015   HCT 36.9 02/21/2015   MCV 92.3 02/21/2015   PLT 214 02/21/2015   Lab Results  Component Value Date   CREATININE 1.19* 02/21/2015   BUN 20 02/21/2015   NA 135 02/21/2015   K 3.2* 02/21/2015   CL 99* 02/21/2015   CO2 28 02/21/2015   Lab Results  Component Value Date   ALT 14 02/21/2015   AST 19 02/21/2015   ALKPHOS 57 02/21/2015   BILITOT 0.5 02/21/2015     Imaging Studies: No results found.

## 2015-07-05 NOTE — Anesthesia Preprocedure Evaluation (Signed)
Anesthesia Evaluation  Patient identified by MRN, date of birth, ID band Patient awake    Reviewed: Allergy & Precautions, NPO status , Patient's Chart, lab work & pertinent test results, reviewed documented beta blocker date and time   History of Anesthesia Complications (+) PONV and history of anesthetic complications  Airway Mallampati: II  TM Distance: >3 FB     Dental  (+) Edentulous Upper, Edentulous Lower   Pulmonary shortness of breath, COPD, former smoker,    breath sounds clear to auscultation       Cardiovascular hypertension, Pt. on medications and Pt. on home beta blockers (-) angina+ CAD, + Past MI, + Peripheral Vascular Disease and +CHF   Rhythm:Regular Rate:Normal     Neuro/Psych PSYCHIATRIC DISORDERS Anxiety Depression    GI/Hepatic GERD  ,  Endo/Other    Renal/GU      Musculoskeletal   Abdominal   Peds  Hematology   Anesthesia Other Findings   Reproductive/Obstetrics                             Anesthesia Physical Anesthesia Plan  ASA: III  Anesthesia Plan: MAC   Post-op Pain Management:    Induction: Intravenous  Airway Management Planned: Simple Face Mask  Additional Equipment:   Intra-op Plan:   Post-operative Plan:   Informed Consent: I have reviewed the patients History and Physical, chart, labs and discussed the procedure including the risks, benefits and alternatives for the proposed anesthesia with the patient or authorized representative who has indicated his/her understanding and acceptance.     Plan Discussed with:   Anesthesia Plan Comments:         Anesthesia Quick Evaluation

## 2015-07-05 NOTE — Interval H&P Note (Signed)
History and Physical Interval Note:  07/05/2015 1:21 PM  [name]  has presented today for surgery, with the diagnosis of * No surgery found *  The various methods of treatment have been discussed with the patient and family. After consideration of risks, benefits and other options for treatment, the patient has consented to  * No surgery found * as a surgical intervention .  The patient's history has been reviewed, patient examined, no change in status, stable for surgery.  I have reviewed the patient's chart and labs.  Questions were answered to the patient's satisfaction.     [name]  No change. EGD/ED and colonoscopy per plan.  The risks, benefits, limitations, imponderables and alternatives regarding both EGD and colonoscopy have been reviewed with the patient. Questions have been answered. All parties agreeable.

## 2015-07-05 NOTE — Discharge Instructions (Signed)
Colonoscopy Discharge Instructions  Read the instructions outlined below and refer to this sheet in the next few weeks. These discharge instructions provide you with general information on caring for yourself after you leave the hospital. Your doctor may also give you specific instructions. While your treatment has been planned according to the most current medical practices available, unavoidable complications occasionally occur. If you have any problems or questions after discharge, call Dr. Gala Romney at [phone]. ACTIVITY  You may resume your regular activity, but move at a slower pace for the next 24 hours.   Take frequent rest periods for the next 24 hours.   Walking will help get rid of the air and reduce the bloated feeling in your belly (abdomen).   No driving for 24 hours (because of the medicine (anesthesia) used during the test).    Do not sign any important legal documents or operate any machinery for 24 hours (because of the anesthesia used during the test).  NUTRITION  Drink plenty of fluids.   You may resume your normal diet as instructed by your doctor.   Begin with a light meal and progress to your normal diet. Heavy or fried foods are harder to digest and may make you feel sick to your stomach (nauseated).   Avoid alcoholic beverages for 24 hours or as instructed.  MEDICATIONS  You may resume your normal medications unless your doctor tells you otherwise.  WHAT YOU CAN EXPECT TODAY  Some feelings of bloating in the abdomen.   Passage of more gas than usual.   Spotting of blood in your stool or on the toilet paper.  IF YOU HAD POLYPS REMOVED DURING THE COLONOSCOPY:  No aspirin products for 7 days or as instructed.   No alcohol for 7 days or as instructed.   Eat a soft diet for the next 24 hours.  FINDING OUT THE RESULTS OF YOUR TEST Not all test results are available during your visit. If your test results are not back during the visit, make an appointment  with your caregiver to find out the results. Do not assume everything is normal if you have not heard from your caregiver or the medical facility. It is important for you to follow up on all of your test results.  SEEK IMMEDIATE MEDICAL ATTENTION IF:  You have more than a spotting of blood in your stool.   Your belly is swollen (abdominal distention).   You are nauseated or vomiting.   You have a temperature over 101.  You have abdominal pain or discomfort that is severe or gets worse throughout the day. EGD Discharge instructions Please read the instructions outlined below and refer to this sheet in the next few weeks. These discharge instructions provide you with general information on caring for yourself after you leave the hospital. Your doctor may also give you specific instructions. While your treatment has been planned according to the most current medical practices available, unavoidable complications occasionally occur. If you have any problems or questions after discharge, please call your doctor. ACTIVITY You may resume your regular activity but move at a slower pace for the next 24 hours.  Take frequent rest periods for the next 24 hours.  Walking will help expel (get rid of) the air and reduce the bloated feeling in your abdomen.  No driving for 24 hours (because of the anesthesia (medicine) used during the test).  You may shower.  Do not sign any important legal documents or operate any machinery for 24  hours (because of the anesthesia used during the test).  NUTRITION Drink plenty of fluids.  You may resume your normal diet.  Begin with a light meal and progress to your normal diet.  Avoid alcoholic beverages for 24 hours or as instructed by your caregiver.  MEDICATIONS You may resume your normal medications unless your caregiver tells you otherwise.  WHAT YOU CAN EXPECT TODAY You may experience abdominal discomfort such as a feeling of fullness or gas pains.   FOLLOW-UP Your doctor will discuss the results of your test with you.  SEEK IMMEDIATE MEDICAL ATTENTION IF ANY OF THE FOLLOWING OCCUR: Excessive nausea (feeling sick to your stomach) and/or vomiting.  Severe abdominal pain and distention (swelling).  Trouble swallowing.  Temperature over 101 F (37.8 C).  Rectal bleeding or vomiting of blood.    Colon polyp information provided  Further recommendations to follow pending review of pathology report      Colon Polyps Polyps are lumps of extra tissue growing inside the body. Polyps can grow in the large intestine (colon). Most colon polyps are noncancerous (benign). However, some colon polyps can become cancerous over time. Polyps that are larger than a pea may be harmful. To be safe, caregivers remove and test all polyps. CAUSES  Polyps form when mutations in the genes cause your cells to grow and divide even though no more tissue is needed. RISK FACTORS There are a number of risk factors that can increase your chances of getting colon polyps. They include:  Being older than 50 years.  Family history of colon polyps or colon cancer.  Long-term colon diseases, such as colitis or Crohn disease.  Being overweight.  Smoking.  Being inactive.  Drinking too much alcohol. SYMPTOMS  Most small polyps do not cause symptoms. If symptoms are present, they may include:  Blood in the stool. The stool may look dark red or black.  Constipation or diarrhea that lasts longer than 1 week. DIAGNOSIS People often do not know they have polyps until their caregiver finds them during a regular checkup. Your caregiver can use 4 tests to check for polyps:  Digital rectal exam. The caregiver wears gloves and feels inside the rectum. This test would find polyps only in the rectum.  Barium enema. The caregiver puts a liquid called barium into your rectum before taking X-rays of your colon. Barium makes your colon look white. Polyps are dark, so  they are easy to see in the X-ray pictures.  Sigmoidoscopy. A thin, flexible tube (sigmoidoscope) is placed into your rectum. The sigmoidoscope has a light and tiny camera in it. The caregiver uses the sigmoidoscope to look at the last third of your colon.  Colonoscopy. This test is like sigmoidoscopy, but the caregiver looks at the entire colon. This is the most common method for finding and removing polyps. TREATMENT  Any polyps will be removed during a sigmoidoscopy or colonoscopy. The polyps are then tested for cancer. PREVENTION  To help lower your risk of getting more colon polyps:  Eat plenty of fruits and vegetables. Avoid eating fatty foods.  Do not smoke.  Avoid drinking alcohol.  Exercise every day.  Lose weight if recommended by your caregiver.  Eat plenty of calcium and folate. Foods that are rich in calcium include milk, cheese, and broccoli. Foods that are rich in folate include chickpeas, kidney beans, and spinach. HOME CARE INSTRUCTIONS Keep all follow-up appointments as directed by your caregiver. You may need periodic exams to check for polyps. SEEK  MEDICAL CARE IF: You notice bleeding during a bowel movement.   This information is not intended to replace advice given to you by your health care provider. Make sure you discuss any questions you have with your health care provider.   Document Released: 12/29/2003 Document Revised: 04/24/2014 Document Reviewed: 06/13/2011 Elsevier Interactive Patient Education [employer].

## 2015-07-07 NOTE — Op Note (Signed)
[facility] Patient Name: [name] Procedure Date: 07/05/2015 1:46 PM MRN: [id number] Date of Birth: [birth year] Attending MD: Norvel Richards , MD CSN: [id number] Age: 82 Admit Type: Outpatient Procedure:                Colonoscopy Indications:              Chronic diarrhea Providers:                Norvel Richards, MD, Janeece Riggers, RN, Georgeann Oppenheim, Technician Referring MD:              Medicines:                Monitored Anesthesia Care Complications:            No immediate complications. Estimated Blood Loss:     Estimated blood loss was minimal. Procedure:                Pre-Anesthesia Assessment:                           - Prior to the procedure, a History and Physical                            was performed, and patient medications and                            allergies were reviewed. The patient's tolerance of                            previous anesthesia was also reviewed. The risks                            and benefits of the procedure and the sedation                            options and risks were discussed with the patient.                            All questions were answered, and informed consent                            was obtained. Prior Anticoagulants: The patient has                            taken no previous anticoagulant or antiplatelet                            agents. ASA Grade Assessment: III - A patient with                            severe systemic disease. After reviewing the risks  and benefits, the patient was deemed in                            satisfactory condition to undergo the procedure.                           - Prior to the procedure, a History and Physical                            was performed, and patient medications and                            allergies were reviewed. The patient's tolerance of                            previous anesthesia was  also reviewed. The risks                            and benefits of the procedure and the sedation                            options and risks were discussed with the patient.                            All questions were answered, and informed consent                            was obtained. ASA Grade Assessment: III - A patient                            with severe systemic disease. After reviewing the                            risks and benefits, the patient was deemed in                            satisfactory condition to undergo the procedure.                           After obtaining informed consent, the colonoscope                            was passed under direct vision. Throughout the                            procedure, the patient's blood pressure, pulse, and                            oxygen saturations were monitored continuously. The                            [id number] scope was introduced through  the anus and advanced to the the cecum, identified                            by appendiceal orifice and ileocecal valve. The                            colonoscopy was performed without difficulty. The                            patient tolerated the procedure well. The quality                            of the bowel preparation was adequate. The                            ileocecal valve, appendiceal orifice, and rectum                            were photographed. Scope In: 1:47:02 PM Scope Out: 2:09:23 PM Scope Withdrawal Time: 0 hours 9 minutes 2 seconds  Total Procedure Duration: 0 hours 22 minutes 21 seconds  Findings:      The digital rectal exam was normal.      A 5 mm polyp was found in the descending colon. The polyp was       semi-pedunculated. The polyp was removed with a cold snare. Resection       and retrieval were complete. Estimated blood loss was minimal. Impression:               - One 5 mm polyp in the descending  colon, removed                            with a cold snare. Resected and retrieved. Moderate Sedation:      Per Anesthesia Care Recommendation:           - Patient has a contact number available for                            emergencies. The signs and symptoms of potential                            delayed complications were discussed with the                            patient. Return to normal activities tomorrow.                            Written discharge instructions were provided to the                            patient.                           - Continue present medications.                           -  Advance diet as tolerated today.                           - Continue present medications.                           - Await pathology results.                           - Repeat colonoscopy is recommended for                            surveillance. The colonoscopy date will be                            determined after pathology results from today's                            exam become available for review. Procedure Code(s):        --- Professional ---                           [phone], Colonoscopy, flexible; with removal of                            tumor(s), polyp(s), or other lesion(s) by snare                            technique Diagnosis Code(s):        --- Professional ---                           D12.4, Benign neoplasm of descending colon                           K52.9, Noninfective gastroenteritis and colitis,                            unspecified CPT copyright 2016 American Medical Association. All rights reserved. The codes documented in this report are preliminary and upon coder review may  be revised to meet current compliance requirements. Cristopher Estimable. Rourk, MD Norvel Richards, MD 07/07/2015 9:07:58 AM This report has been signed electronically. Number of Addenda: 0

## 2015-07-08 NOTE — Op Note (Signed)
[facility] Patient Name: [name] Procedure Date: 07/05/2015 1:31 PM MRN: [id number] Date of Birth: Jan 01, 1933 Attending MD: Norvel Richards , MD CSN: [id number] Age: 82 Admit Type: Outpatient Procedure:                Upper GI endoscopy Indications:              Esophageal dysphagia Providers:                Norvel Richards, MD, Janeece Riggers, RN, Georgeann Oppenheim, Technician Referring MD:             Tawni Carnes Medicines:                Monitored Anesthesia Care Complications:            No immediate complications. Estimated Blood Loss:     Estimated blood loss was minimal. Procedure:                Pre-Anesthesia Assessment:                           - Prior to the procedure, a History and Physical                            was performed, and patient medications and                            allergies were reviewed. The patient's tolerance of                            previous anesthesia was also reviewed. The risks                            and benefits of the procedure and the sedation                            options and risks were discussed with the patient.                            All questions were answered, and informed consent                            was obtained. Prior Anticoagulants: The patient has                            taken no previous anticoagulant or antiplatelet                            agents. ASA Grade Assessment: III - A patient with                            severe systemic disease. After reviewing the risks  and benefits, the patient was deemed in                            satisfactory condition to undergo the procedure.                           After obtaining informed consent, the endoscope was                            passed under direct vision. Throughout the                            procedure, the patient's blood pressure, pulse, and   oxygen saturations were monitored continuously. The                            [id number]) scope was introduced through the                            mouth, and advanced to the second part of duodenum.                            The upper GI endoscopy was accomplished without                            difficulty. The patient tolerated the procedure                            well. Scope In: 1:36:07 PM Scope Out: 1:43:38 PM Total Procedure Duration: 0 hours 7 minutes 31 seconds  Findings:      The examined esophagus was normal. The scope was withdrawn. Dilation was       performed with a Maloney dilator with no resistance at 66 Fr. The       dilation site was examined following endoscope reinsertion and showed no       change. Estimated blood loss was minimal.      Multiple 3 mm sessile polyps with no bleeding and no stigmata of recent       bleeding were found in the stomach. Biopsies were taken with a cold       forceps for histology. Estimated blood loss was minimal.      The in the duodenum was normal. Impression:               - Normal esophagus. Dilated.                           - Multiple gastric polyps. Biopsied.                           - Normal. Moderate Sedation:      Per Anesthesia Care Recommendation:           - Patient has a contact number available for                            emergencies. The signs and symptoms of potential  delayed complications were discussed with the                            patient. Return to normal activities tomorrow.                            Written discharge instructions were provided to the                            patient.                           - Advance diet as tolerated.                           - Continue present medications.                           - Await pathology results. Procedure Code(s):        --- Professional ---                           [phone], Esophagogastroduodenoscopy,  flexible,                            transoral; with biopsy, single or multiple                           43450, Dilation of esophagus, by unguided sound or                            bougie, single or multiple passes Diagnosis Code(s):        --- Professional ---                           K31.7, Polyp of stomach and duodenum                           R13.14, Dysphagia, pharyngoesophageal phase CPT copyright 2016 American Medical Association. All rights reserved. The codes documented in this report are preliminary and upon coder review may  be revised to meet current compliance requirements. Cristopher Estimable. Rosamae Rocque, MD Norvel Richards, MD 07/08/2015 1:30:32 PM This report has been signed electronically. Number of Addenda: 0

## 2015-07-09 ENCOUNTER — Telehealth: Payer: Self-pay

## 2015-07-09 ENCOUNTER — Encounter: Payer: Self-pay | Admitting: Internal Medicine

## 2015-07-09 NOTE — Telephone Encounter (Signed)
Per RMR-  Send letter to patient.  Send copy of letter with path to referring provider and PCP.   Offer follow-up appointment the next several weeks with extender if needed to reassess diarrhea

## 2015-07-09 NOTE — Telephone Encounter (Signed)
Letter mailed to the pt. 

## 2015-07-14 ENCOUNTER — Encounter (HOSPITAL_COMMUNITY): Payer: Self-pay | Admitting: Internal Medicine

## 2015-08-03 ENCOUNTER — Ambulatory Visit: Payer: Medicare Other | Admitting: Gastroenterology

## 2015-08-03 DIAGNOSIS — F329 Major depressive disorder, single episode, unspecified: Secondary | ICD-10-CM | POA: Diagnosis not present

## 2015-08-03 DIAGNOSIS — K580 Irritable bowel syndrome with diarrhea: Secondary | ICD-10-CM | POA: Diagnosis not present

## 2015-08-03 DIAGNOSIS — I739 Peripheral vascular disease, unspecified: Secondary | ICD-10-CM | POA: Diagnosis not present

## 2015-08-03 DIAGNOSIS — E782 Mixed hyperlipidemia: Secondary | ICD-10-CM | POA: Diagnosis not present

## 2015-08-03 DIAGNOSIS — K219 Gastro-esophageal reflux disease without esophagitis: Secondary | ICD-10-CM | POA: Diagnosis not present

## 2015-08-03 DIAGNOSIS — F419 Anxiety disorder, unspecified: Secondary | ICD-10-CM | POA: Diagnosis not present

## 2015-08-03 DIAGNOSIS — I5030 Unspecified diastolic (congestive) heart failure: Secondary | ICD-10-CM | POA: Diagnosis not present

## 2015-08-03 DIAGNOSIS — I10 Essential (primary) hypertension: Secondary | ICD-10-CM | POA: Diagnosis not present

## 2015-08-04 ENCOUNTER — Ambulatory Visit (INDEPENDENT_AMBULATORY_CARE_PROVIDER_SITE_OTHER): Payer: Medicare Other | Admitting: Gastroenterology

## 2015-08-04 ENCOUNTER — Encounter: Payer: Self-pay | Admitting: Gastroenterology

## 2015-08-04 VITALS — BP 130/61 | HR 51 | Temp 96.8°F | Ht 61.0 in | Wt 178.2 lb

## 2015-08-04 DIAGNOSIS — R1314 Dysphagia, pharyngoesophageal phase: Secondary | ICD-10-CM | POA: Diagnosis not present

## 2015-08-04 DIAGNOSIS — R1310 Dysphagia, unspecified: Secondary | ICD-10-CM

## 2015-08-04 DIAGNOSIS — K529 Noninfective gastroenteritis and colitis, unspecified: Secondary | ICD-10-CM | POA: Diagnosis not present

## 2015-08-04 DIAGNOSIS — R1319 Other dysphagia: Secondary | ICD-10-CM

## 2015-08-04 MED ORDER — RESTORA PO CAPS: 1.0000 | ORAL_CAPSULE | Freq: Every day | ORAL | Status: DC

## 2015-08-04 NOTE — Assessment & Plan Note (Signed)
Improved status post dilation.

## 2015-08-04 NOTE — Assessment & Plan Note (Signed)
Resolved. Planes of postprandial abdominal bloating and gas. FODMAP diet. Add probiotic daily for 4 weeks. Call with ongoing symptoms.

## 2015-08-04 NOTE — Progress Notes (Signed)
Primary Care Physician: [name]  Primary Gastroenterologist:  Garfield Cornea, MD   Chief Complaint  Patient presents with  . Diarrhea    Not as bad now    HPI: [name] is a 82 y.o. female here For follow-up of recent EGD and colonoscopy. History of dysphagia, chronic nausea, change in bowel habits with diarrhea. EGD was unremarkable. Esophagus dilated empirically for history of dysphagia. She had fundal gland polyp. Colonoscopy with single polyp which was to adenoma. Random colon biopsies negative. No future colonoscopies planned for screening or surveillance.  Patient states that she is feeling better. Diarrhea has resolved. Having 1-2 bowel movements daily. Complains postprandial gas and bloating regardless of what she eats. Admits to being under a lot of stress. Son is sick, she is worried he may have had a stroke but he refuses to go to the hospital/Dr. He has liver disease from alcohol use. Patient reports her dysphagia has improved. Really no issues with heartburn nausea at this time.   Current Outpatient Prescriptions  Medication Sig Dispense Refill  . acetaminophen (TYLENOL) 500 MG tablet Take 500 mg by mouth every 6 (six) hours as needed for mild pain.     [name] Kitchen ALPRAZolam (XANAX) 0.25 MG tablet Take 0.25 mg by mouth 2 (two) times daily as needed for anxiety.     [name] Kitchen aspirin 81 MG EC tablet Take 81 mg by mouth every morning.     . carvedilol (COREG) 12.5 MG tablet TAKE ONE TABLET BY MOUTH TWICE DAILY. 60 tablet 5  . DULoxetine (CYMBALTA) 60 MG capsule Take 60 mg by mouth at bedtime.     . fluticasone (FLONASE) 50 MCG/ACT nasal spray Place 2 sprays into both nostrils daily as needed for allergies.     . furosemide (LASIX) 20 MG tablet Take 1 tablet (20 mg total) by mouth daily. 90 tablet 3  . loperamide (IMODIUM A-D) 2 MG tablet Take 2 mg by mouth 4 (four) times daily as needed for diarrhea or loose stools.    . pantoprazole (PROTONIX) 40 MG tablet Take 40 mg  by mouth every morning.     . potassium chloride (K-DUR,KLOR-CON) 10 MEQ tablet TAKE TWO (2) TABLETS BY MOUTH DAILY. 60 tablet 6  . nitroGLYCERIN (NITROSTAT) 0.4 MG SL tablet Place 0.4 mg under the tongue every 5 (five) minutes as needed for chest pain. Reported on 08/04/2015     No current facility-administered medications for this visit.    Allergies as of 08/04/2015 - Review Complete 08/04/2015  Allergen Reaction Noted  . Cephalosporins  08/04/2015  . Levaquin [levofloxacin in d5w] Swelling 07/06/2014  . Macrodantin [nitrofurantoin macrocrystal] Swelling 07/06/2014  . Phenothiazines Anaphylaxis and Hives 08/16/2011  . Polysorbate Anaphylaxis 08/16/2011  . Prednisone Shortness Of Breath   . Buspirone Itching 07/06/2014  . Cardura [doxazosin mesylate] Itching 07/06/2014  . Codeine Itching   . Prochlorperazine Other (See Comments) 08/16/2011  . Ranexa [ranolazine]  04/06/2015  . Atorvastatin Hives 08/16/2011  . Ofloxacin Rash   . Other Itching and Rash 10/10/2012  . Penicillins Other (See Comments) 11/09/2011  . Pimozide Hives and Itching 08/16/2011    ROS:  General: Negative for anorexia, weight loss, fever, chills, fatigue, weakness. ENT: Negative for hoarseness, difficulty swallowing , nasal congestion. CV: Negative for chest pain, angina, palpitations, dyspnea on exertion, peripheral edema.  Respiratory: Negative for dyspnea at rest, dyspnea on exertion, cough, sputum, wheezing.  GI: See history of present illness. GU:  Negative for dysuria,  hematuria, urinary incontinence, urinary frequency, nocturnal urination.  Endo: Negative for unusual weight change.    Physical Examination:   BP 130/61 mmHg  Pulse 51  Temp(Src) 96.8 F (36 C) (Oral)  Ht 5\' 1"  (1.549 m)  Wt 178 lb 3.2 oz (80.831 kg)  BMI 33.69 kg/m2  General: Well-nourished, well-developed in no acute distress.  Eyes: No icterus. Mouth: Oropharyngeal mucosa moist and pink , no lesions erythema or  exudate. Lungs: Clear to auscultation bilaterally.  Heart: Regular rate and rhythm, no murmurs rubs or gallops.  Abdomen: Bowel sounds are normal, nontender, nondistended, no hepatosplenomegaly or masses, no abdominal bruits or hernia , no rebound or guarding.   Extremities: No lower extremity edema. No clubbing or deformities. Neuro: Alert and oriented x 4   Skin: Warm and dry, no jaundice.   Psych: Alert and cooperative, normal mood and affect.  Labs:  Lab Results  Component Value Date   WBC 7.6 06/29/2015   HGB 13.0 06/29/2015   HCT 37.5 06/29/2015   MCV 92.1 06/29/2015   PLT 239 06/29/2015   Lab Results  Component Value Date   ALT 14 02/21/2015   AST 19 02/21/2015   ALKPHOS 57 02/21/2015   BILITOT 0.5 02/21/2015    Imaging Studies: No results found.

## 2015-08-04 NOTE — Patient Instructions (Signed)
1. For gas/bloating start Restora one daily for four weeks. Samples provided. 2. Please refer to the FODMAP diet sheet. Eliminate or reduce consumption of these foods to see if it helps with bloating/gas.

## 2015-08-05 NOTE — Progress Notes (Signed)
CC'ED TO PCP 

## 2015-08-12 DIAGNOSIS — Z6833 Body mass index (BMI) 33.0-33.9, adult: Secondary | ICD-10-CM | POA: Diagnosis not present

## 2015-08-12 DIAGNOSIS — I10 Essential (primary) hypertension: Secondary | ICD-10-CM | POA: Diagnosis not present

## 2015-08-12 DIAGNOSIS — M549 Dorsalgia, unspecified: Secondary | ICD-10-CM | POA: Diagnosis not present

## 2015-08-13 ENCOUNTER — Other Ambulatory Visit: Payer: Self-pay | Admitting: Cardiovascular Disease

## 2015-08-18 DIAGNOSIS — H2513 Age-related nuclear cataract, bilateral: Secondary | ICD-10-CM | POA: Diagnosis not present

## 2015-08-19 ENCOUNTER — Other Ambulatory Visit: Payer: Self-pay | Admitting: Neurosurgery

## 2015-08-19 DIAGNOSIS — M549 Dorsalgia, unspecified: Secondary | ICD-10-CM

## 2015-08-25 ENCOUNTER — Ambulatory Visit
Admission: RE | Admit: 2015-08-25 | Discharge: 2015-08-25 | Disposition: A | Discharge status: Home / Self Care | Payer: Medicare Other | Source: Ambulatory Visit | Attending: Neurosurgery | Admitting: Neurosurgery

## 2015-08-25 DIAGNOSIS — M5126 Other intervertebral disc displacement, lumbar region: Secondary | ICD-10-CM | POA: Diagnosis not present

## 2015-08-25 DIAGNOSIS — M549 Dorsalgia, unspecified: Secondary | ICD-10-CM

## 2015-08-25 DIAGNOSIS — M50221 Other cervical disc displacement at C4-C5 level: Secondary | ICD-10-CM | POA: Diagnosis not present

## 2015-08-25 MED ORDER — IOPAMIDOL (ISOVUE-M 300) INJECTION 61%
10.0000 mL | Freq: Once | INTRAMUSCULAR | Status: AC | PRN
  Administered 2015-08-25: 10 mL via INTRATHECAL

## 2015-08-25 MED ORDER — DIAZEPAM 5 MG PO TABS
5.0000 mg | ORAL_TABLET | Freq: Once | ORAL | Status: AC
  Administered 2015-08-25: 5 mg via ORAL

## 2015-08-25 NOTE — Discharge Instructions (Signed)
Myelogram Discharge Instructions  1. Go home and rest quietly for the next 24 hours.  It is important to lie flat for the next 24 hours.  Get up only to go to the restroom.  You may lie in the bed or on a couch on your back, your stomach, your left side or your right side.  You may have one pillow under your head.  You may have pillows between your knees while you are on your side or under your knees while you are on your back.  2. DO NOT drive today.  Recline the seat as far back as it will go, while still wearing your seat belt, on the way home.  3. You may get up to go to the bathroom as needed.  You may sit up for 10 minutes to eat.  You may resume your normal diet and medications unless otherwise indicated.  Drink lots of extra fluids today and tomorrow.  4. The incidence of headache, nausea, or vomiting is about 5% (one in 20 patients).  If you develop a headache, lie flat and drink plenty of fluids until the headache goes away.  Caffeinated beverages may be helpful.  If you develop severe nausea and vomiting or a headache that does not go away with flat bed rest, call [phone].  5. You may resume normal activities after your 24 hours of bed rest is over; however, do not exert yourself strongly or do any heavy lifting tomorrow. If when you get up you have a headache when standing, go back to bed and force fluids for another 24 hours.  6. Call your physician for a follow-up appointment.  The results of your myelogram will be sent directly to your physician by the following day.  7. If you have any questions or if complications develop after you arrive home, please call [phone].  Discharge instructions have been explained to the patient.  The patient, or the person responsible for the patient, fully understands these instructions.       May resume Cymbalta on Aug 26, 2015, after 1:00 pm.

## 2015-08-25 NOTE — Progress Notes (Signed)
Pt states she has been off Cymbalta since last Saturday.

## 2015-08-30 DIAGNOSIS — M4310 Spondylolisthesis, site unspecified: Secondary | ICD-10-CM | POA: Insufficient documentation

## 2015-08-30 DIAGNOSIS — M4316 Spondylolisthesis, lumbar region: Secondary | ICD-10-CM | POA: Diagnosis not present

## 2015-08-30 DIAGNOSIS — Z6833 Body mass index (BMI) 33.0-33.9, adult: Secondary | ICD-10-CM | POA: Diagnosis not present

## 2015-08-30 DIAGNOSIS — I10 Essential (primary) hypertension: Secondary | ICD-10-CM | POA: Diagnosis not present

## 2015-09-02 DIAGNOSIS — H2512 Age-related nuclear cataract, left eye: Secondary | ICD-10-CM | POA: Diagnosis not present

## 2015-09-02 DIAGNOSIS — H35031 Hypertensive retinopathy, right eye: Secondary | ICD-10-CM | POA: Diagnosis not present

## 2015-09-02 DIAGNOSIS — H25011 Cortical age-related cataract, right eye: Secondary | ICD-10-CM | POA: Diagnosis not present

## 2015-09-02 DIAGNOSIS — H35032 Hypertensive retinopathy, left eye: Secondary | ICD-10-CM | POA: Diagnosis not present

## 2015-09-02 DIAGNOSIS — H25012 Cortical age-related cataract, left eye: Secondary | ICD-10-CM | POA: Diagnosis not present

## 2015-09-02 DIAGNOSIS — H2511 Age-related nuclear cataract, right eye: Secondary | ICD-10-CM | POA: Diagnosis not present

## 2015-09-06 ENCOUNTER — Other Ambulatory Visit: Payer: Self-pay | Admitting: Neurosurgery

## 2015-09-07 DIAGNOSIS — J019 Acute sinusitis, unspecified: Secondary | ICD-10-CM | POA: Diagnosis not present

## 2015-09-17 ENCOUNTER — Encounter: Payer: Self-pay | Admitting: Adult Health

## 2015-09-17 ENCOUNTER — Ambulatory Visit (INDEPENDENT_AMBULATORY_CARE_PROVIDER_SITE_OTHER): Payer: Medicare Other | Admitting: Adult Health

## 2015-09-17 ENCOUNTER — Other Ambulatory Visit (HOSPITAL_COMMUNITY)
Admission: RE | Admit: 2015-09-17 | Discharge: 2015-09-17 | Disposition: A | Discharge status: Home / Self Care | Payer: Medicare Other | Source: Ambulatory Visit | Attending: Adult Health | Admitting: Adult Health

## 2015-09-17 VITALS — BP 124/66 | HR 63 | Ht 61.0 in | Wt 181.0 lb

## 2015-09-17 DIAGNOSIS — I2510 Atherosclerotic heart disease of native coronary artery without angina pectoris: Secondary | ICD-10-CM

## 2015-09-17 DIAGNOSIS — Z79899 Other long term (current) drug therapy: Secondary | ICD-10-CM | POA: Diagnosis not present

## 2015-09-17 DIAGNOSIS — I10 Essential (primary) hypertension: Secondary | ICD-10-CM | POA: Diagnosis not present

## 2015-09-17 LAB — BASIC METABOLIC PANEL
Anion gap: 7 (ref 5–15)
BUN: 18 mg/dL (ref 6–20)
CHLORIDE: 105 mmol/L (ref 101–111)
CO2: 24 mmol/L (ref 22–32)
Calcium: 9 mg/dL (ref 8.9–10.3)
Creatinine, Ser: 1.04 mg/dL — ABNORMAL HIGH (ref 0.44–1.00)
GFR, EST AFRICAN AMERICAN: 57 mL/min — AB (ref >60)
GFR, EST NON AFRICAN AMERICAN: 49 mL/min — AB (ref >60)
Glucose, Bld: 110 mg/dL — ABNORMAL HIGH (ref 65–99)
POTASSIUM: 4.4 mmol/L (ref 3.5–5.1)
SODIUM: 136 mmol/L (ref 135–145)

## 2015-09-17 LAB — MAGNESIUM: MAGNESIUM: 2 mg/dL (ref 1.7–2.4)

## 2015-09-17 NOTE — Progress Notes (Deleted)
Name: [name]    DOB: Jun 26, 1932  Age: 82 y.o.  MR#: [id number]       PCP:  [name]      Insurance: Payor: MEDICARE / Plan: MEDICARE PART A AND B / Product Type: *No Product type* /   CC:    Chief Complaint  Patient presents with  . Coronary Artery Disease  . Hypertension    VS Filed Vitals:   09/17/15 1310  BP: 124/66  Pulse: 63  Height: 5\' 1"  (1.549 m)  Weight: 181 lb (82.101 kg)  SpO2: 96%    Weights Current Weight  09/17/15 181 lb (82.101 kg)  08/04/15 178 lb 3.2 oz (80.831 kg)  07/05/15 183 lb (83.008 kg)    Blood Pressure  BP Readings from Last 3 Encounters:  09/17/15 124/66  08/25/15 144/70  08/04/15 130/61     Admit date:  (Not on file) Last encounter with RMR:  05/25/2015   Allergy Cephalosporins; Levaquin; Macrodantin; Phenothiazines; Polysorbate; Prednisone; Buspirone; Cardura; Codeine; Prochlorperazine; Ranexa; Atorvastatin; Ofloxacin; Other; Penicillins; and Pimozide  Current Outpatient Prescriptions  Medication Sig Dispense Refill  . acetaminophen (TYLENOL) 500 MG tablet Take 500 mg by mouth daily as needed for headache.     . ALPRAZolam (XANAX) 0.25 MG tablet Take 0.25 mg by mouth 2 (two) times daily as needed for anxiety.     [name] Kitchen aspirin 81 MG EC tablet Take 81 mg by mouth every morning.     . Carbamide Peroxide (EAR WAX DROPS OT) Place 4 drops in ear(s) 2 (two) times a week.  0  . carvedilol (COREG) 12.5 MG tablet TAKE ONE TABLET BY MOUTH TWICE DAILY. 60 tablet 6  . desonide (DESOWEN) 0.05 % cream Apply 1 application topically daily as needed (for skin irritation).    . DULoxetine (CYMBALTA) 60 MG capsule Take 60 mg by mouth at bedtime.     . fluticasone (FLONASE) 50 MCG/ACT nasal spray Place 2 sprays into both nostrils daily as needed for allergies.     . furosemide (LASIX) 20 MG tablet Take 1 tablet (20 mg total) by mouth daily. 90 tablet 3  . loperamide (IMODIUM A-D) 2 MG tablet Take 2 mg by mouth 4 (four) times daily as needed for  diarrhea or loose stools.    [name] Kitchen neomycin-bacitracin-polymyxin (NEOSPORIN) ointment Apply 1 application topically daily as needed for wound care (for blisters).    . nitroGLYCERIN (NITROSTAT) 0.4 MG SL tablet Place 0.4 mg under the tongue every 5 (five) minutes as needed for chest pain. Reported on 08/04/2015    . pantoprazole (PROTONIX) 40 MG tablet Take 40 mg by mouth every morning.     . potassium chloride (K-DUR,KLOR-CON) 10 MEQ tablet TAKE TWO (2) TABLETS BY MOUTH DAILY. 60 tablet 6  . Probiotic Product (RESTORA) CAPS Take 1 capsule by mouth daily. 30 capsule 0   No current facility-administered medications for this visit.    Discontinued Meds:   There are no discontinued medications.  Patient Active Problem List   Diagnosis Date Noted  . Gastric polyp   . History of colonic polyps   . Chronic diarrhea   . Diarrhea 04/01/2015  . Bowel habit changes 04/01/2015  . Esophageal dysphagia 04/01/2015  . PAOD (peripheral arterial occlusive disease) ([place]) 03/05/2015  . Pain in the chest   . Hyperlipidemia   . Weight gain 08/12/2014  . Normocytic anemia 08/07/2014  . Hemorrhoids 08/06/2014  . Diastolic CHF, acute on chronic (HCC) 07/30/2014  . Dyspnea on exertion   .  CHF (congestive heart failure) ([place]) 07/29/2014  . Rectal bleeding 07/06/2014  . Constipation 07/06/2014  . Chest pain 11/24/2013  . Lower extremity edema 11/24/2013  . Acute kidney failure ([place]) 11/24/2013  . Hypokalemia 11/24/2013  . Other and unspecified angina pectoris 05/14/2013  . Hematochezia 02/13/2011  . Abdominal pain 02/13/2011  . Major depression ([place]) 09/28/2010  . FATIGUE 04/13/2009  . Chronic diastolic heart failure ([place]) 11/30/2008  . DYSPNEA 11/30/2008  . HYPERLIPIDEMIA-MIXED 11/27/2008  . CAD, NATIVE VESSEL - PCI + DES to left circumflex 05/14/13 11/27/2008  . Peripheral vascular disease ([place]) 11/27/2008  . PANIC ATTACK 02/28/2008  . MI 02/28/2008  . Internal hemorrhoids 02/28/2008  .  GASTROESOPHAGEAL REFLUX DISEASE, CHRONIC 02/28/2008  . COLITIS 02/28/2008  . Dysphagia 02/28/2008  . History of cardiovascular disorder 02/28/2008    LABS    Component Value Date/Time   NA 139 06/29/2015 1015   NA 135 02/21/2015 1656   NA 137 11/01/2014 1920   K 3.9 06/29/2015 1015   K 3.2* 02/21/2015 1656   K 3.6 11/01/2014 1920   CL 107 06/29/2015 1015   CL 99* 02/21/2015 1656   CL 100* 11/01/2014 1920   CO2 25 06/29/2015 1015   CO2 28 02/21/2015 1656   CO2 28 11/01/2014 1920   GLUCOSE 98 06/29/2015 1015   GLUCOSE 168* 02/21/2015 1656   GLUCOSE 102* 11/01/2014 1920   BUN 17 06/29/2015 1015   BUN 20 02/21/2015 1656   BUN 21* 11/01/2014 1920   CREATININE 1.07* 06/29/2015 1015   CREATININE 1.19* 02/21/2015 1656   CREATININE 1.17* 11/01/2014 2325   CREATININE 1.17* 02/13/2011 1526   CALCIUM 8.7* 06/29/2015 1015   CALCIUM 8.5* 02/21/2015 1656   CALCIUM 8.5* 11/01/2014 1920   GFRNONAA 48* 06/29/2015 1015   GFRNONAA 42* 02/21/2015 1656   GFRNONAA 43* 11/01/2014 2325   GFRAA 55* 06/29/2015 1015   GFRAA 49* 02/21/2015 1656   GFRAA 50* 11/01/2014 2325   CMP     Component Value Date/Time   NA 139 06/29/2015 1015   K 3.9 06/29/2015 1015   CL 107 06/29/2015 1015   CO2 25 06/29/2015 1015   GLUCOSE 98 06/29/2015 1015   BUN 17 06/29/2015 1015   CREATININE 1.07* 06/29/2015 1015   CREATININE 1.17* 02/13/2011 1526   CALCIUM 8.7* 06/29/2015 1015   PROT 6.8 02/21/2015 1656   ALBUMIN 3.8 02/21/2015 1656   AST 19 02/21/2015 1656   ALT 14 02/21/2015 1656   ALKPHOS 57 02/21/2015 1656   BILITOT 0.5 02/21/2015 1656   GFRNONAA 48* 06/29/2015 1015   GFRAA 55* 06/29/2015 1015       Component Value Date/Time   WBC 7.6 06/29/2015 1015   WBC 6.8 02/21/2015 1656   WBC 6.6 11/01/2014 2325   HGB 13.0 06/29/2015 1015   HGB 12.9 02/21/2015 1656   HGB 11.8* 11/01/2014 2325   HCT 37.5 06/29/2015 1015   HCT 36.9 02/21/2015 1656   HCT 34.2* 11/01/2014 2325   MCV 92.1 06/29/2015 1015    MCV 92.3 02/21/2015 1656   MCV 87.2 11/01/2014 2325    Lipid Panel     Component Value Date/Time   CHOL 181 11/24/2013 1110   TRIG 111 11/24/2013 1110   HDL 56 11/24/2013 1110   CHOLHDL 3.2 11/24/2013 1110   VLDL 22 11/24/2013 1110   LDLCALC 103* 11/24/2013 1110    ABG No results found for: PHART, PCO2ART, PO2ART, HCO3, TCO2, ACIDBASEDEF, O2SAT   Lab Results  Component Value Date  TSH 2.770 11/25/2013   BNP (last 3 results)  Recent Labs  11/01/14 1920  BNP 58.0    ProBNP (last 3 results) No results for input(s): PROBNP in the last 8760 hours.  Cardiac Panel (last 3 results) No results for input(s): CKTOTAL, CKMB, TROPONINI, RELINDX in the last 72 hours.  Iron/TIBC/Ferritin/ %Sat No results found for: IRON, TIBC, FERRITIN, IRONPCTSAT   EKG Orders placed or performed in visit on 06/29/15  . EKG 12-Lead  . EKG 12-Lead     Prior Assessment and Plan Problem List as of 09/17/2015      Cardiovascular and Mediastinum   MI   CAD, NATIVE VESSEL - PCI + DES to left circumflex 05/14/13   Last Assessment & Plan 05/29/2013 Office Visit Written 05/29/2013  2:04 PM by Lendon Colonel, NP    She offers no complaints of recurrent chest pain. She is medically compliant. Refills on NTG are provided.       Chronic diastolic heart failure [facility])   Last Assessment & Plan 05/29/2013 Office Visit Written 05/29/2013  2:04 PM by Lendon Colonel, NP    No evidence of fluid retention by exam. She is stable on her weight.      Peripheral vascular disease [facility])   Last Assessment & Plan 09/28/2010 Office Visit Written 09/28/2010  1:58 PM by Ezra Sites, MD    Patient reports no claudication.      Internal hemorrhoids   Last Assessment & Plan 07/06/2014 Office Visit Edited 07/06/2014  9:42 AM by Mahala Menghini, PA-C    Recent flare in hemorrhoids in setting of constipation and with rectal bleeding. Several illnesses lately and limited mobility like cause of constipation. Lat TCS 2012  as outlined under [place].   Start anusol HC suppositories bid for 12 days. miralax 1/2 to 1 capful daily to keep stool soft.  Patient not interested in [facility] hemorrhoid banding at this time. Return to the office in 3-4 weeks for follow up or call sooner if needed.       Other and unspecified angina pectoris   CHF (congestive heart failure) (HCC)   Diastolic CHF, acute on chronic [facility])   Hemorrhoids   Last Assessment & Plan 08/06/2014 Office Visit Edited 08/07/2014  1:46 PM by Mahala Menghini, PA-C    Trial of Proctocort foam anorectally bid for 10 days. Call with PR in couple of weeks. Patient has tolerated hydrocortisone hemorrhoid creams before (allergy to prednisone).      PAOD (peripheral arterial occlusive disease) [facility])     Digestive   GASTROESOPHAGEAL REFLUX DISEASE, CHRONIC   Last Assessment & Plan 04/20/2011 Office Visit Written 04/20/2011  4:13 PM by Mahala Menghini, PA    Well controlled.      COLITIS   Dysphagia   Last Assessment & Plan 02/13/2011 Office Visit Written 02/14/2011  7:59 PM by Andria Meuse, NP    Hx esophageal dysphagia w/ hx Schatzki's ring & esophageal motility disorder. May require repeat esophageal dilation.      Rectal bleeding   Constipation   Last Assessment & Plan 08/06/2014 Office Visit Written 08/07/2014  1:44 PM by Mahala Menghini, PA-C    Trial of Amitiza 7mcg BID with food. Stop Fiber and miralax which are likely contributing to her abdominal bloating/gas.      Esophageal dysphagia   Last Assessment & Plan 08/04/2015 Office Visit Written 08/04/2015  4:25 PM by Mahala Menghini, PA-C    Improved status post  dilation.      Gastric polyp   Chronic diarrhea   Last Assessment & Plan 08/04/2015 Office Visit Written 08/04/2015  4:26 PM by Mahala Menghini, PA-C    Resolved. Planes of postprandial abdominal bloating and gas. FODMAP diet. Add probiotic daily for 4 weeks. Call with ongoing symptoms.        Genitourinary   Acute kidney failure [facility])      Other   HYPERLIPIDEMIA-MIXED   Last Assessment & Plan 08/16/2011 Office Visit Written 08/16/2011  2:57 PM by Donney Dice, PA    Patient  has documented intolerance to statins, most recently Crestor. Followup labs drawn yesterday in Dr. Edythe Lynn office, to whom we will defer future monitoring/management. Aggressive  treatment recommended, however, with target LDL 70 or less, if feasible.       PANIC ATTACK   FATIGUE   DYSPNEA   Last Assessment & Plan 02/12/2014 Office Visit Edited 02/12/2014  8:15 PM by Tanda Rockers, MD    - 02/12/2014  Walked RA x 1 lap  @ 185 ft each stopped due to  Sob, no desat, moderate pace  - 02/12/2014 spirometry >   FEV1  1.41 (83%) ratio 83   Symptoms are markedly disproportionate to objective findings and not clear this is a lung problem but pt does appear to have difficult airway management issues. DDX of  difficult airways management all start with A and  include Adherence, Ace Inhibitors, Acid Reflux, Active Sinus Disease, Alpha 1 Antitripsin deficiency, Anxiety masquerading as Airways dz,  ABPA,  allergy(esp in young), Aspiration (esp in elderly), Adverse effects of DPI,  Active smokers, plus two Bs  = Bronchiectasis and Beta blocker use..and one C= CHF  ? Acid (or non-acid) GERD > always difficult to exclude as up to 75% of pts in some series report no assoc GI/ Heartburn symptoms> rec max (24h)  acid suppression and diet restrictions/ reviewed and instructions given in writing.   ? Anxiety/ depression/ deconditioning all playing a role but not easily corrected.   ? chf > note breathing was better p stopped smoking until cardiac issues and wt gain > already f/u by cards, not decompensated at present  No evidence of a pulmonary problem here so f/u can be prn.      History of cardiovascular disorder   Last Assessment & Plan 08/16/2011 Office Visit Written 08/16/2011  2:56 PM by Donney Dice, PA    Will defer to Dr. Edythe Lynn office for ongoing  monitoring/management.      Major depression [facility])   Last Assessment & Plan 09/28/2010 Office Visit Written 09/28/2010  1:59 PM by Ezra Sites, MD    The patient's depression is quite severe she has been on Cymbalta and takes alprazolam for her anxiety attacks. She is more depressed and has significant insomnia. I added trazodone 50 mg by mouth each bedtimeboth as a sleeping aid as well as augmentation therapy for her depression.      Hematochezia   Last Assessment & Plan 02/13/2011 Office Visit Edited 02/14/2011  8:03 PM by Andria Meuse, NP    This is concerning.  Will most likely need colonoscopy to determine etiology since last colonoscopy over 3 yrs ago after CT for further evaluation.      Abdominal pain   Last Assessment & Plan 05/30/2011 Office Visit Written 05/30/2011  3:44 PM by Daneil Dolin, MD    One-year history of right lower quadrant abdominal pain. Pain unrelenting  and insidiously worsening. It has a positional component. I doubt this is radicular pain. Likewise, I doubt this is musculoskeletal pain. It sounds visceral to me. Wonder about adhesions or an occult process i.e. spigelian hernia. She's had a thorough workup to date.  Recommendations: Proceed with a surgery consultation for consideration of diagnostic laparoscopy with for lyses of adhesions, etc. I've taken the liberty to get her scheduled see Dr. Aviva Signs as her former general surgeon, Dr. Lindalou Hose, is no longer in the area.      Chest pain   Lower extremity edema   Hypokalemia   Dyspnea on exertion   Normocytic anemia   Last Assessment & Plan 08/06/2014 Office Visit Written 08/07/2014  1:45 PM by Mahala Menghini, PA-C    Noted during recent admission. Will recheck at this time.       Weight gain   Pain in the chest   Hyperlipidemia   Diarrhea   Last Assessment & Plan 06/11/2015 Office Visit Written 06/11/2015  1:21 PM by Mahala Menghini, PA-C    Six-month history of change in bowel habits, frequent  profuse diarrhea, debilitating at times. Afraid to leave her house. Remote history of microscopic colitis. Recent workup negative for infectious etiology. Abnormal small bowel biopsies in the remote past but not consistent with celiac.  Recommend EGD with small bowel biopsy and colonoscopy with random colon biopsies in the near future. She will require deep sedation given her prior requirements and polypharmacy.  I have discussed the risks, alternatives, benefits with regards to but not limited to the risk of reaction to medication, bleeding, infection, perforation and the patient is agreeable to proceed. Written consent to be obtained.       Bowel habit changes   Last Assessment & Plan 04/01/2015 Office Visit Written 04/01/2015  2:09 PM by Mahala Menghini, PA-C    Greater than 6 month history of change in bowel habits. Went from constipation back in April to several month history of predominantly diarrhea. If stool is softer solid, she describes thin caliber stools with difficulty passing due to hemorrhoidal inflammation. Complains of fecal urgency with incontinence. She has had a couple of hospitalizations this year and likely antibiotic therapy. We will check stool studies for infectious etiology but go ahead and plan for colonoscopy with deep sedation in the OR given polypharmacy for history of change in bowel habits, diarrhea.  I have discussed the risks, alternatives, benefits with regards to but not limited to the risk of reaction to medication, bleeding, infection, perforation and the patient is agreeable to proceed. Written consent to be obtained.       History of colonic polyps       Imaging: Ct Cervical Spine W Contrast  08/25/2015  CLINICAL DATA:  Spondylosis without myelopathy. Previous lumbar decompression. Bilateral leg numbness. Bilateral arm numbness in weakness. Left-sided neck pain when turning the head towards the left. FLUOROSCOPY TIME:  1 minutes 1 second. 317.79 micro gray  meter squared PROCEDURE: LUMBAR PUNCTURE FOR CERVICAL AND LUMBAR MYELOGRAM CERVICAL AND LUMBAR MYELOGRAM CT CERVICAL MYELOGRAM CT LUMBAR MYELOGRAM After thorough discussion of risks and benefits of the procedure including bleeding, infection, injury to nerves, blood vessels, adjacent structures as well as headache and CSF leak, written and oral informed consent was obtained. Consent was obtained by Dr. Nelson Chimes. Patient was positioned prone on the fluoroscopy table. Local anesthesia was provided with 1% lidocaine without epinephrine after prepped and draped in the usual sterile fashion. Puncture was performed  at L3-4 using a 3 1/2 inch 22-gauge spinal needle via left para median approach. Using a single pass through the dura, the needle was placed within the thecal sac, with return of clear CSF. 10 mL of Isovue-300 was injected into the thecal sac, with normal opacification of the nerve roots and cauda equina consistent with free flow within the subarachnoid space. The patient was then moved to the trendelenburg position and contrast flowed into the Cervical spine region. I personally performed the lumbar puncture and administered the intrathecal contrast. I also personally performed acquisition of the myelogram images. TECHNIQUE: Contiguous axial images were obtained through the Cervical and Lumbar spine after the intrathecal infusion of infusion. Coronal and sagittal reconstructions were obtained of the axial image sets. FINDINGS: CERVICAL AND LUMBAR MYELOGRAM FINDINGS: Lumbar region: Previous posterior decompression. No central canal stenosis. No visible focal nerve compression. 2 mm of anterolisthesis at L4-5 and L5-S1 with standing and flexion. Cervical region: Small anterior extradural defect at C5-6. No compressive stenosis. No abnormal motion. CT CERVICAL MYELOGRAM FINDINGS: Foramen magnum is widely patent. Mild osteoarthritis of the C1-2 articulation without encroachment upon the neural spaces. C2-3:   Normal interspace. C3-4:  Normal interspace. C4-5: Advanced facet arthropathy on the left. 1 mm of anterolisthesis. Mild bulging of the disc. No central canal stenosis. Foraminal narrowing on the left that could affect the C5 nerve root. C5-6: Endplate osteophytes and mild bulging of the disc. Mild facet arthropathy bilaterally. Mild foraminal narrowing on the left because of osteophytic encroachment. C6-7:  Normal interspace. C7-T1:  Normal interspace. No abnormal cord finding. CT LUMBAR MYELOGRAM FINDINGS: T12-L1:  Normal interspace. L1-2: Mild circumferential bulging of the disc. No compressive stenosis. L2-3: Moderate circumferential bulging of the disc. Previous posterior decompression. Mild foraminal stenosis on the left could possibly affect the L2 nerve root. Central canal well decompressed. L3-4: Circumferential bulging of the disc. Previous posterior decompression. Good decompression of the central canal. Foraminal narrowing on the left could affect the L3 nerve root. L4-5: Previous posterior decompression. Bilateral facet arthropathy with 2 mm of anterolisthesis. Mild circumferential bulging of the disc. Mild foraminal narrowing on the right could affect the L4 nerve root. L5-S1: Bilateral facet arthropathy with 2 mm of anterolisthesis. Mild bulging of the disc. No central canal stenosis. No compressive foraminal stenosis. Ordinary osteoarthritis of both sacroiliac joints. IMPRESSION: Lumbar region: Previous posterior decompression at L2-3, L3-4 and L4-5. No central canal stenosis. Facet arthropathy at L4-5 and L5-S1 with 2 mm of anterolisthesis at both levels. Left foraminal narrowing at L2-3 and L3-4 could possibly affect the L2 and L3 nerve roots on the outside. Right foraminal narrowing at L4-5 could possibly affect the right L4 nerve root. Cervical region: Facet arthropathy on the left at C4-5 which could be the cause of the left-sided neck pain and limited range of motion. Foraminal narrowing on the  left which could affect the C5 nerve root. Facet degeneration also at C5-6 with only mild foraminal narrowing on the left. Electronically Signed   By: Nelson Chimes M.D.   On: 08/25/2015 14:30   Ct Lumbar Spine W Contrast  08/25/2015  CLINICAL DATA:  Spondylosis without myelopathy. Previous lumbar decompression. Bilateral leg numbness. Bilateral arm numbness in weakness. Left-sided neck pain when turning the head towards the left. FLUOROSCOPY TIME:  1 minutes 1 second. 317.79 micro gray meter squared PROCEDURE: LUMBAR PUNCTURE FOR CERVICAL AND LUMBAR MYELOGRAM CERVICAL AND LUMBAR MYELOGRAM CT CERVICAL MYELOGRAM CT LUMBAR MYELOGRAM After thorough discussion of risks and benefits  of the procedure including bleeding, infection, injury to nerves, blood vessels, adjacent structures as well as headache and CSF leak, written and oral informed consent was obtained. Consent was obtained by Dr. Nelson Chimes. Patient was positioned prone on the fluoroscopy table. Local anesthesia was provided with 1% lidocaine without epinephrine after prepped and draped in the usual sterile fashion. Puncture was performed at L3-4 using a 3 1/2 inch 22-gauge spinal needle via left para median approach. Using a single pass through the dura, the needle was placed within the thecal sac, with return of clear CSF. 10 mL of Isovue-300 was injected into the thecal sac, with normal opacification of the nerve roots and cauda equina consistent with free flow within the subarachnoid space. The patient was then moved to the trendelenburg position and contrast flowed into the Cervical spine region. I personally performed the lumbar puncture and administered the intrathecal contrast. I also personally performed acquisition of the myelogram images. TECHNIQUE: Contiguous axial images were obtained through the Cervical and Lumbar spine after the intrathecal infusion of infusion. Coronal and sagittal reconstructions were obtained of the axial image sets.  FINDINGS: CERVICAL AND LUMBAR MYELOGRAM FINDINGS: Lumbar region: Previous posterior decompression. No central canal stenosis. No visible focal nerve compression. 2 mm of anterolisthesis at L4-5 and L5-S1 with standing and flexion. Cervical region: Small anterior extradural defect at C5-6. No compressive stenosis. No abnormal motion. CT CERVICAL MYELOGRAM FINDINGS: Foramen magnum is widely patent. Mild osteoarthritis of the C1-2 articulation without encroachment upon the neural spaces. C2-3:  Normal interspace. C3-4:  Normal interspace. C4-5: Advanced facet arthropathy on the left. 1 mm of anterolisthesis. Mild bulging of the disc. No central canal stenosis. Foraminal narrowing on the left that could affect the C5 nerve root. C5-6: Endplate osteophytes and mild bulging of the disc. Mild facet arthropathy bilaterally. Mild foraminal narrowing on the left because of osteophytic encroachment. C6-7:  Normal interspace. C7-T1:  Normal interspace. No abnormal cord finding. CT LUMBAR MYELOGRAM FINDINGS: T12-L1:  Normal interspace. L1-2: Mild circumferential bulging of the disc. No compressive stenosis. L2-3: Moderate circumferential bulging of the disc. Previous posterior decompression. Mild foraminal stenosis on the left could possibly affect the L2 nerve root. Central canal well decompressed. L3-4: Circumferential bulging of the disc. Previous posterior decompression. Good decompression of the central canal. Foraminal narrowing on the left could affect the L3 nerve root. L4-5: Previous posterior decompression. Bilateral facet arthropathy with 2 mm of anterolisthesis. Mild circumferential bulging of the disc. Mild foraminal narrowing on the right could affect the L4 nerve root. L5-S1: Bilateral facet arthropathy with 2 mm of anterolisthesis. Mild bulging of the disc. No central canal stenosis. No compressive foraminal stenosis. Ordinary osteoarthritis of both sacroiliac joints. IMPRESSION: Lumbar region: Previous posterior  decompression at L2-3, L3-4 and L4-5. No central canal stenosis. Facet arthropathy at L4-5 and L5-S1 with 2 mm of anterolisthesis at both levels. Left foraminal narrowing at L2-3 and L3-4 could possibly affect the L2 and L3 nerve roots on the outside. Right foraminal narrowing at L4-5 could possibly affect the right L4 nerve root. Cervical region: Facet arthropathy on the left at C4-5 which could be the cause of the left-sided neck pain and limited range of motion. Foraminal narrowing on the left which could affect the C5 nerve root. Facet degeneration also at C5-6 with only mild foraminal narrowing on the left. Electronically Signed   By: Nelson Chimes M.D.   On: 08/25/2015 14:30   Dg Myelography Lumbar Inj Multi Region  08/25/2015  CLINICAL DATA:  Spondylosis without myelopathy. Previous lumbar decompression. Bilateral leg numbness. Bilateral arm numbness in weakness. Left-sided neck pain when turning the head towards the left. FLUOROSCOPY TIME:  1 minutes 1 second. 317.79 micro gray meter squared PROCEDURE: LUMBAR PUNCTURE FOR CERVICAL AND LUMBAR MYELOGRAM CERVICAL AND LUMBAR MYELOGRAM CT CERVICAL MYELOGRAM CT LUMBAR MYELOGRAM After thorough discussion of risks and benefits of the procedure including bleeding, infection, injury to nerves, blood vessels, adjacent structures as well as headache and CSF leak, written and oral informed consent was obtained. Consent was obtained by Dr. Nelson Chimes. Patient was positioned prone on the fluoroscopy table. Local anesthesia was provided with 1% lidocaine without epinephrine after prepped and draped in the usual sterile fashion. Puncture was performed at L3-4 using a 3 1/2 inch 22-gauge spinal needle via left para median approach. Using a single pass through the dura, the needle was placed within the thecal sac, with return of clear CSF. 10 mL of Isovue-300 was injected into the thecal sac, with normal opacification of the nerve roots and cauda equina consistent with free  flow within the subarachnoid space. The patient was then moved to the trendelenburg position and contrast flowed into the Cervical spine region. I personally performed the lumbar puncture and administered the intrathecal contrast. I also personally performed acquisition of the myelogram images. TECHNIQUE: Contiguous axial images were obtained through the Cervical and Lumbar spine after the intrathecal infusion of infusion. Coronal and sagittal reconstructions were obtained of the axial image sets. FINDINGS: CERVICAL AND LUMBAR MYELOGRAM FINDINGS: Lumbar region: Previous posterior decompression. No central canal stenosis. No visible focal nerve compression. 2 mm of anterolisthesis at L4-5 and L5-S1 with standing and flexion. Cervical region: Small anterior extradural defect at C5-6. No compressive stenosis. No abnormal motion. CT CERVICAL MYELOGRAM FINDINGS: Foramen magnum is widely patent. Mild osteoarthritis of the C1-2 articulation without encroachment upon the neural spaces. C2-3:  Normal interspace. C3-4:  Normal interspace. C4-5: Advanced facet arthropathy on the left. 1 mm of anterolisthesis. Mild bulging of the disc. No central canal stenosis. Foraminal narrowing on the left that could affect the C5 nerve root. C5-6: Endplate osteophytes and mild bulging of the disc. Mild facet arthropathy bilaterally. Mild foraminal narrowing on the left because of osteophytic encroachment. C6-7:  Normal interspace. C7-T1:  Normal interspace. No abnormal cord finding. CT LUMBAR MYELOGRAM FINDINGS: T12-L1:  Normal interspace. L1-2: Mild circumferential bulging of the disc. No compressive stenosis. L2-3: Moderate circumferential bulging of the disc. Previous posterior decompression. Mild foraminal stenosis on the left could possibly affect the L2 nerve root. Central canal well decompressed. L3-4: Circumferential bulging of the disc. Previous posterior decompression. Good decompression of the central canal. Foraminal narrowing  on the left could affect the L3 nerve root. L4-5: Previous posterior decompression. Bilateral facet arthropathy with 2 mm of anterolisthesis. Mild circumferential bulging of the disc. Mild foraminal narrowing on the right could affect the L4 nerve root. L5-S1: Bilateral facet arthropathy with 2 mm of anterolisthesis. Mild bulging of the disc. No central canal stenosis. No compressive foraminal stenosis. Ordinary osteoarthritis of both sacroiliac joints. IMPRESSION: Lumbar region: Previous posterior decompression at L2-3, L3-4 and L4-5. No central canal stenosis. Facet arthropathy at L4-5 and L5-S1 with 2 mm of anterolisthesis at both levels. Left foraminal narrowing at L2-3 and L3-4 could possibly affect the L2 and L3 nerve roots on the outside. Right foraminal narrowing at L4-5 could possibly affect the right L4 nerve root. Cervical region: Facet arthropathy on the left at C4-5 which  could be the cause of the left-sided neck pain and limited range of motion. Foraminal narrowing on the left which could affect the C5 nerve root. Facet degeneration also at C5-6 with only mild foraminal narrowing on the left. Electronically Signed   By: Nelson Chimes M.D.   On: 08/25/2015 14:30

## 2015-09-17 NOTE — Progress Notes (Signed)
Cardiology Office Note   Date:  09/17/2015   ID:  [name], DOB May 08, 1932, MRN [id number]  PCP:  [name]  Cardiologist: Tonya Chen, NP   Chief Complaint  Patient presents with  . Coronary Artery Disease  . Hypertension      History of Present Illness: [name] is a 82 y.o. female who presents for ongoing assessment and management of CAD, with history of drug-eluting stents the left circumflex in January 2015, proximal LAD showed mild nonobstructive disease with moderately severe disease in a small nondominant right coronary artery. Other history of hypertension, hyperlipidemia, chronic diastolic heart failure, depression and severe panic attacks.  She was last seen in November 2016, was complaining of some mild exertional chest tightness and Ranexa was increased to 1000 mg twice a day. She was continued on aspirin, Crestor.blood pressure was well controlled.  NM Study 11/25/2013 1. Abnormal Lexiscan due to low ejection fraction. There is no evidence of ischemia. 2. Visually LVEF appears normal. By calculated volumes LVEF reported at 31%. Recommend echo to clarify LV function  And she is here today without cardiac complaints. She has multiple complaints of a musculoskeletal nature. She is due to have lumbar disc surgery on June 9 in [place]. And she will also have cataract surgery of her left eye in July of 2015. The patient is feeling better after having stopped Ranexa. She states that she feels better off the medicine and believes now that this is more related to her back and disc disease instead of her heart.  She is medically compliant and although not as active as she wants to be because of her back she continues to be busy..  Past Medical History  Diagnosis Date  . PVD (peripheral vascular disease) ([place])   . Other and unspecified hyperlipidemia   . Coronary atherosclerosis of native coronary artery     nonobstructive CAD by multiple  catheterizations  . Panic disorder without agoraphobia   . HTN (hypertension)     Hx of it  . MI (myocardial infarction) ([place]) 2006  . Internal hemorrhoids without mention of complication   . Dysphagia, unspecified(787.20)   . Microscopic colitis 2003  . GERD (gastroesophageal reflux disease)     Hx Schatzki's ring, multiple EGD/ED last 01/06/2004  . Hyperlipemia   . Thyroid disease     recent abnl TSH per pt  . S/P colonoscopy 09/27/2001    internal hemorrhoids, desc colon inflam polyp, SB BX-chronic duodenitis, colitis  . Arthritis   . Shortness of breath   . Heart disease   . Bursitis     left shoulder  . Hyperlipidemia   . Sleep disorder     obstructive  . COPD (chronic obstructive pulmonary disease) ([place])   . Complication of anesthesia   . PONV (postoperative nausea and vomiting)     'a little nausea"  . Anxiety   . Depression   . Pneumonia 12/2011  . Heart murmur     'a littel'  . Cataract   . Paresthesia     hands, feet  . CHF (congestive heart failure) [facility])     Past Surgical History  Procedure Laterality Date  . Tonsillectomy    . Unspecified area, hysterectomy  1972    partial  . Cholecystectomy  1998  . Right knee replacement  2007  . Right leg benign tumor    . Breast lumpectomy  1998    left, benign  . Left hand surgery    .  Left rotator cuff surgery    . Nasal sinus surgery    . Bladder tack  06/2010  . Maloney dilation  03/16/2011    Gastritis. No H.pylori on bx. 60F maloney dilation with disruption of  occult cevical esophageal web  . Colonoscopy  03/16/2011    multiple hyperplastic colon polyps, sigmoid diverticulosis, melanosis coli  . Bladder suspension  11/09/2011    Procedure: TRANSVAGINAL TAPE (TVT) PROCEDURE;  Surgeon: Marissa Nestle, MD;  Location: [facility] ORS;  Service: Urology;  Laterality: N/A;  . Abdominal hysterectomy    . Appendectomy    . Carpal tunnel release  1989    left  . Anterior and posterior repair      with resection of  vagina  . Shoulder surgery Left   . Joint replacement Right 2007  . Lumbar laminectomy/decompression microdiscectomy N/A 10/11/2012    Procedure: LUMBAR LAMINECTOMY/DECOMPRESSION MICRODISCECTOMY 2 LEVELS;  Surgeon: Floyce Stakes, MD;  Location: [facility] NEURO ORS;  Service: Neurosurgery;  Laterality: N/A;  L3-4 L4-5 Laminectomy  . Left heart catheterization with coronary angiogram N/A 05/14/2013    Procedure: LEFT HEART CATHETERIZATION WITH CORONARY ANGIOGRAM;  Surgeon: Blane Ohara, MD;  Location[facility];  Service: Cardiovascular;  Laterality: N/A;  . Cardiac catheterization    . Cardiac catheterization    . Coronary angioplasty with stent placement    . Colonoscopy with propofol N/A 07/05/2015    RMR:one 5 mm polyp in descending colon  . Esophagogastroduodenoscopy (egd) with propofol N/A 07/05/2015    [id number]  . Esophageal dilation N/A 07/05/2015    Procedure: ESOPHAGEAL DILATION;  Surgeon: Daneil Dolin, MD;  Location: [facility] ENDO SUITE;  Service: Endoscopy;  Laterality: N/A;  . Biopsy  07/05/2015    Procedure: BIOPSY;  Surgeon: Daneil Dolin, MD;  Location: [facility] END[facility] SU[facility];  Service: Endoscopy;;  gastric polyp biopsy, ascending colon biopsy     Current Outpatient Prescriptions  Medication Sig Dispense Refill  . acetaminophen (TYLENOL) 500 MG tablet Take 500 mg by mouth daily as needed for headache.     . ALPRAZolam (XANAX) 0.25 MG tablet Take 0.25 mg by mouth 2 (two) times daily as needed for anxiety.     [name] Kitchen aspirin 81 MG EC tablet Take 81 mg by mouth every morning.     . Carbamide Peroxide (EAR WAX DROPS OT) Place 4 drops in ear(s) 2 (two) times a week.  0  . carvedilol (COREG) 12.5 MG tablet TAKE ONE TABLET BY MOUTH TWICE DAILY. 60 tablet 6  . desonide (DESOWEN) 0.05 % cream Apply 1 application topically daily as needed (for skin irritation).    . DULoxetine (CYMBALTA) 60 MG capsule Take 60 mg by mouth at bedtime.     . fluticasone (FLONASE) 50 MCG/ACT nasal spray Place 2 sprays  into both nostrils daily as needed for allergies.     . furosemide (LASIX) 20 MG tablet Take 1 tablet (20 mg total) by mouth daily. 90 tablet 3  . loperamide (IMODIUM A-D) 2 MG tablet Take 2 mg by mouth 4 (four) times daily as needed for diarrhea or loose stools.    [name] Kitchen neomycin-bacitracin-polymyxin (NEOSPORIN) ointment Apply 1 application topically daily as needed for wound care (for blisters).    . nitroGLYCERIN (NITROSTAT) 0.4 MG SL tablet Place 0.4 mg under the tongue every 5 (five) minutes as needed for chest pain. Reported on 08/04/2015    . pantoprazole (PROTONIX) 40 MG tablet Take 40 mg by mouth every morning.     [name] Kitchen  potassium chloride (K-DUR,KLOR-CON) 10 MEQ tablet TAKE TWO (2) TABLETS BY MOUTH DAILY. 60 tablet 6  . Probiotic Product (RESTORA) CAPS Take 1 capsule by mouth daily. 30 capsule 0   No current facility-administered medications for this visit.    Allergies:   Cephalosporins; Levaquin; Macrodantin; Phenothiazines; Polysorbate; Prednisone; Buspirone; Cardura; Codeine; Prochlorperazine; Ranexa; Atorvastatin; Ofloxacin; Other; Penicillins; and Pimozide    Social History:  The patient  reports that she quit smoking about 13 years ago. Her smoking use included Cigarettes. She started smoking about 67 years ago. She has a 64 pack-year smoking history. She has never used smokeless tobacco. She reports that she does not drink alcohol or use illicit drugs.   Family History:  The patient's family history includes Cancer in her other; Coronary artery disease in her other; Diabetes in her brother and son; Heart disease in her son; Hypertension in her other; Stroke in her mother and other; Stroke (age of onset: 45) in her daughter.    ROS: All other systems are reviewed and negative. Unless otherwise mentioned in H&P    PHYSICAL EXAM: VS:  BP 124/66 mmHg  Pulse 63  Ht 5\' 1"  (1.549 m)  Wt 181 lb (82.101 kg)  BMI 34.22 kg/m2  SpO2 96% , BMI Body mass index is 34.22 kg/(m^2). GEN: Well  nourished, well developed, in no acute distress HEENT: normal Neck: no JVD, carotid bruits, or masses Cardiac: RRR; no murmurs, rubs, or gallops,no edema  Respiratory:  clear to auscultation bilaterally, normal work of breathing GI: soft, nontender, nondistended, + BS. Obese. MS: no deformity or atrophy Skin: warm and dry, no rash Neuro:  Strength and sensation are intact Psych: euthymic mood, full affect   Recent Labs: 11/01/2014: B Natriuretic Peptide 58.0 02/21/2015: ALT 14 06/29/2015: BUN 17; Creatinine, Ser 1.07*; Hemoglobin 13.0; Platelets 239; Potassium 3.9; Sodium 139    Lipid Panel    Component Value Date/Time   CHOL 181 11/24/2013 1110   TRIG 111 11/24/2013 1110   HDL 56 11/24/2013 1110   CHOLHDL 3.2 11/24/2013 1110   VLDL 22 11/24/2013 1110   LDLCALC 103* 11/24/2013 1110      Wt Readings from Last 3 Encounters:  09/17/15 181 lb (82.101 kg)  08/04/15 178 lb 3.2 oz (80.831 kg)  07/05/15 183 lb (83.008 kg)     ASSESSMENT AND PLAN:  1. Coronary artery disease: The patient is stable from a cardiac standpoint. She denies any recurrent issues of angina dizziness or dyspnea. She is no longer taking Ranexa. Would continue risk modification, she is currently not on a statin due to myalgias.  2. Hypertension:blood pressure is currently very well controlled. She is on carvedilol and Lasix. I will check a BMET and magnesium and she is also on a PPI.  3. Lumbar disc disease: she is at low risk from a cardiac standpoint to proceed with surgery. Continue beta blocker perioperatively.  Current medicines are reviewed at length with the patient today.    Labs/ tests ordered today include: BMET.Mg+.  No orders of the defined types were placed in this encounter.     Disposition:   FU with 6 months Signed, Jory Sims, NP  09/17/2015 1:27 PM    [facility] [address], [place], [place] [address] Phone: [phone]; Fax: [phone]

## 2015-09-17 NOTE — Patient Instructions (Signed)
Your physician wants you to follow-up in: 6 Months with Dr. Bronson Ing. You will receive a reminder letter in the mail two months in advance. If you don't receive a letter, please call our office to schedule the follow-up appointment.  Your physician recommends that you have lab work done today.  If you need a refill on your cardiac medications before your next appointment, please call your pharmacy.  Thank you for choosing [facility]!

## 2015-09-20 ENCOUNTER — Encounter (HOSPITAL_COMMUNITY)
Admission: RE | Admit: 2015-09-20 | Discharge: 2015-09-20 | Disposition: A | Discharge status: Home / Self Care | Payer: Medicare Other | Source: Ambulatory Visit | Attending: Neurosurgery | Admitting: Neurosurgery

## 2015-09-20 ENCOUNTER — Encounter (HOSPITAL_COMMUNITY): Payer: Self-pay

## 2015-09-20 DIAGNOSIS — I252 Old myocardial infarction: Secondary | ICD-10-CM | POA: Diagnosis not present

## 2015-09-20 DIAGNOSIS — I10 Essential (primary) hypertension: Secondary | ICD-10-CM | POA: Insufficient documentation

## 2015-09-20 DIAGNOSIS — I2510 Atherosclerotic heart disease of native coronary artery without angina pectoris: Secondary | ICD-10-CM | POA: Diagnosis not present

## 2015-09-20 DIAGNOSIS — K219 Gastro-esophageal reflux disease without esophagitis: Secondary | ICD-10-CM | POA: Diagnosis not present

## 2015-09-20 DIAGNOSIS — Z87891 Personal history of nicotine dependence: Secondary | ICD-10-CM | POA: Diagnosis not present

## 2015-09-20 DIAGNOSIS — Z7982 Long term (current) use of aspirin: Secondary | ICD-10-CM | POA: Insufficient documentation

## 2015-09-20 DIAGNOSIS — Z79899 Other long term (current) drug therapy: Secondary | ICD-10-CM | POA: Insufficient documentation

## 2015-09-20 DIAGNOSIS — M4316 Spondylolisthesis, lumbar region: Secondary | ICD-10-CM | POA: Insufficient documentation

## 2015-09-20 DIAGNOSIS — E785 Hyperlipidemia, unspecified: Secondary | ICD-10-CM | POA: Insufficient documentation

## 2015-09-20 DIAGNOSIS — Z01812 Encounter for preprocedural laboratory examination: Secondary | ICD-10-CM | POA: Insufficient documentation

## 2015-09-20 DIAGNOSIS — I739 Peripheral vascular disease, unspecified: Secondary | ICD-10-CM | POA: Insufficient documentation

## 2015-09-20 DIAGNOSIS — Z955 Presence of coronary angioplasty implant and graft: Secondary | ICD-10-CM | POA: Diagnosis not present

## 2015-09-20 DIAGNOSIS — G4733 Obstructive sleep apnea (adult) (pediatric): Secondary | ICD-10-CM | POA: Insufficient documentation

## 2015-09-20 DIAGNOSIS — Z0183 Encounter for blood typing: Secondary | ICD-10-CM | POA: Diagnosis not present

## 2015-09-20 DIAGNOSIS — E079 Disorder of thyroid, unspecified: Secondary | ICD-10-CM | POA: Insufficient documentation

## 2015-09-20 HISTORY — DX: Headache: R51

## 2015-09-20 HISTORY — DX: Headache, unspecified: R51.9

## 2015-09-20 LAB — CBC
HEMATOCRIT: 37.9 % (ref 36.0–46.0)
HEMOGLOBIN: 12.8 g/dL (ref 12.0–15.0)
MCH: 30.3 pg (ref 26.0–34.0)
MCHC: 33.8 g/dL (ref 30.0–36.0)
MCV: 89.6 fL (ref 78.0–100.0)
Platelets: 240 10*3/uL (ref 150–400)
RBC: 4.23 MIL/uL (ref 3.87–5.11)
RDW: 13.7 % (ref 11.5–15.5)
WBC: 7.2 10*3/uL (ref 4.0–10.5)

## 2015-09-20 LAB — TYPE AND SCREEN
ABO/RH(D): O POS
Antibody Screen: NEGATIVE

## 2015-09-20 LAB — BASIC METABOLIC PANEL
ANION GAP: 6 (ref 5–15)
BUN: 17 mg/dL (ref 6–20)
CHLORIDE: 105 mmol/L (ref 101–111)
CO2: 28 mmol/L (ref 22–32)
Calcium: 9.2 mg/dL (ref 8.9–10.3)
Creatinine, Ser: 1.1 mg/dL — ABNORMAL HIGH (ref 0.44–1.00)
GFR calc non Af Amer: 46 mL/min — ABNORMAL LOW (ref >60)
GFR, EST AFRICAN AMERICAN: 53 mL/min — AB (ref >60)
Glucose, Bld: 116 mg/dL — ABNORMAL HIGH (ref 65–99)
POTASSIUM: 3.9 mmol/L (ref 3.5–5.1)
SODIUM: 139 mmol/L (ref 135–145)

## 2015-09-20 LAB — ABO/RH: ABO/RH(D): O POS

## 2015-09-20 LAB — SURGICAL PCR SCREEN
MRSA, PCR: NEGATIVE
Staphylococcus aureus: NEGATIVE

## 2015-09-20 NOTE — Progress Notes (Addendum)
[id number] in [place], pt. Sees Tawni Carnes PA-C Cardiologist: Jory Sims NP- clearance note in Epic  Left message for Janett Billow at Dr. Harley Hallmark office for clarification of arrival time for pt.  Orders states to arrive 6 AM for a 10:30 surgery. I stated for her to call pt. If this time isn't correct.Pt. Will arrive at 6 AM unless further notice.

## 2015-09-20 NOTE — Pre-Procedure Instructions (Signed)
    [name]  09/20/2015      [facility] - [place], [place], [place] - [address] [place] [address] Phone: [phone] Fax: [phone]    Your procedure is scheduled on Friday, June 9  Report to [facility] Admitting at 6:00 A.M.  Call this number if you have problems the morning of surgery:  [phone]   Remember:  Do not eat food or drink liquids after midnight.  Take these medicines the morning of surgery with A SIP OF WATER: tylenol if needed,  Alprazolan(xanax) if needed, carvedilol (coreg), flonase nasal spray if needed, nitroglycerine if needed, protonix (pantoprazole)              stop aspirin, motrin, advil, ibuprofen, Goody's, BC'S,vitamins/herbal medicines   Do not wear jewelry, make-up or nail polish.  Do not wear lotions, powders, or perfumes.  You may not wear deodorant.  Do not shave 48 hours prior to surgery.  Men may shave face and neck.  Do not bring valuables to the hospital.  [facility] is not responsible for any belongings or valuables.  Contacts, dentures or bridgework may not be worn into surgery.  Leave your suitcase in the car.  After surgery it may be brought to your room.  For patients admitted to the hospital, discharge time will be determined by your treatment team.  Patients discharged the day of surgery will not be allowed to drive home.   Name and phone number of your driver:   Special instructions: review preparing for surgery  Please read over the following fact sheets that you were given. Blood Transfusion Information and MRSA Information

## 2015-09-21 ENCOUNTER — Encounter (HOSPITAL_COMMUNITY): Payer: Self-pay

## 2015-09-21 NOTE — Progress Notes (Signed)
Anesthesia Chart Review:  Pt is an 82 year old female scheduled for L3-4, L4-5 lumbar fusion with pedicle screws on 09/24/2015 with Dr. Joya Salm.   Cardiologist is Dr. Bronson Ing, last office visit 09/17/15 with Jory Sims, NP who cleared pt for surgery.   PMH includes:  CAD (DES to CX 2015), MI, CHF, PVD, HTN, heart murmur, hyperlipidemia, OSA, thyroid disease, post-op N/V, GERD. Former smoker. BMI 34. S/p lumbar laminectomy 10/11/12.   Medications include: ASA, carvedilol, lasix, protonix, potassium  Preoperative labs reviewed.    1 view CXR 02/21/15: No acute abnormalities. Aortic atherosclerosis  EKG 06/29/15: NSR. Septal infarct, age undetermined  Echo 07/30/14:  - Left ventricle: The cavity size was normal. Wall thickness was normal. Systolic function was normal. The estimated ejection fraction was in the range of 55% to 60%. Wall motion was normal; there were no regional wall motion abnormalities. Features are consistent with a pseudonormal left ventricular filling pattern, with concomitant abnormal relaxation and increased filling pressure (grade 2 diastolic dysfunction). - Aortic valve: Mildly calcified annulus. Trileaflet; mildly calcified leaflets. There was trivial regurgitation. Valve area (Vmax): 1.85 cm^2. - Mitral valve: Mildly calcified annulus. There was trivial regurgitation. - Left atrium: The atrium was mildly dilated. - Right atrium: Central venous pressure (est): 3 mm Hg. - Tricuspid valve: There was trivial regurgitation. - Pulmonary arteries: PA peak pressure: 23 mm Hg (S). - Pericardium, extracardiac: There was no pericardial effusion. - Impressions: Normal LV wall thickness with LVEF 0000000, grade 2 diastolic dysfunction. Mild MAC. Mild left atrial enlargement. Sclerotic aortic valve with trivial aortic regurgitation. Normal estimatedPASP 23 mmHg.  Nuclear stress test 11/25/13:  1. Abnormal Lexiscan due to low ejection fraction. There is no evidence of ischemia. 2.  Visually LVEF appears normal. By calculated volumes LVEF reported at 31%. Recommend echo to clarify LV function  Cardiac cath 05/14/13:  1. mild nonobstructive stenosis of the proximal LAD (30-40%), stable from the previous study 2. Moderately severe stenosis of the proximal left circumflex with positive pressure wire analysis, treated successfully with PCI using a drug-eluting stent 3. Moderately severe stenosis of a small, nondominant RCA (70% ostial stenosis; mid vessel has 50% stenosis).  If no changes, I anticipate pt can proceed with surgery as scheduled.   Willeen Cass, FNP-BC [facility]/Anesthesiology Phone: [phone] 09/21/2015 1:47 PM

## 2015-09-23 NOTE — H&P (Signed)
[name] is an 82 y.o. female.   Chief Complaint: bilateral leg pain HPI: patient seen initially in 2014 with lumbar pain with radiation to both legs with walking and burning sensation. She had decompression but lately the pain came back and radiological studies showed spondylolisthesis at l4-5 andl3-4. No better with conservative treatment.  In the neck she has pain with radiation to the arms.  Past Medical History  Diagnosis Date  . PVD (peripheral vascular disease) ([place])   . Other and unspecified hyperlipidemia   . Panic disorder without agoraphobia   . HTN (hypertension)     Hx of it  . MI (myocardial infarction) ([place]) 2006  . Internal hemorrhoids without mention of complication   . Dysphagia, unspecified(787.20)   . Microscopic colitis 2003  . GERD (gastroesophageal reflux disease)     Hx Schatzki's ring, multiple EGD/ED last 01/06/2004  . Hyperlipemia   . Thyroid disease     recent abnl TSH per pt  . S/P colonoscopy 09/27/2001    internal hemorrhoids, desc colon inflam polyp, SB BX-chronic duodenitis, colitis  . Arthritis   . Shortness of breath   . Heart disease   . Bursitis     left shoulder  . Hyperlipidemia   . Sleep disorder     obstructive  . COPD (chronic obstructive pulmonary disease) ([place])   . Complication of anesthesia   . PONV (postoperative nausea and vomiting)     'a little nausea"  . Anxiety   . Depression   . Pneumonia 12/2011  . Heart murmur     'a littel'  . Cataract   . Paresthesia     hands, feet  . CHF (congestive heart failure) ([place])   . Headache   . Coronary atherosclerosis of native coronary artery     DES to CX, moderately severe stenosis RCA, mild stenosis LAD 04/2013    Past Surgical History  Procedure Laterality Date  . Tonsillectomy    . Unspecified area, hysterectomy  1972    partial  . Cholecystectomy  1998  . Right knee replacement  2007  . Right leg benign tumor    . Breast lumpectomy  1998    left, benign  . Left hand  surgery    . Left rotator cuff surgery    . Nasal sinus surgery    . Bladder tack  06/2010  . Maloney dilation  03/16/2011    Gastritis. No H.pylori on bx. 35F maloney dilation with disruption of  occult cevical esophageal web  . Colonoscopy  03/16/2011    multiple hyperplastic colon polyps, sigmoid diverticulosis, melanosis coli  . Bladder suspension  11/09/2011    Procedure: TRANSVAGINAL TAPE (TVT) PROCEDURE;  Surgeon: Marissa Nestle, MD;  Location: [facility];  Service: Urology;  Laterality: N/A;  . Abdominal hysterectomy    . Appendectomy    . Carpal tunnel release  1989    left  . Anterior and posterior repair      with resection of vagina  . Shoulder surgery Left   . Joint replacement Right 2007  . Lumbar laminectomy/decompression microdiscectomy N/A 10/11/2012    Procedure: LUMBAR LAMINECTOMY/DECOMPRESSION MICRODISCECTOMY 2 LEVELS;  Surgeon: Floyce Stakes, MD;  Location: [facility]S;  Service: Neurosurgery;  Laterality: N/A;  L3-4 L4-5 Laminectomy  . Left heart catheterization with coronary angiogram N/A 05/14/2013    Procedure: LEFT HEART CATHETERIZATION WITH CORONARY ANGIOGRAM;  Surgeon: Blane Ohara, MD;  Location: [facility] CATH LAB;  Service: Cardiovascular;  Laterality: N/A;  . Cardiac catheterization    . Cardiac catheterization    . Coronary angioplasty with stent placement    . Colonoscopy with propofol N/A 07/05/2015    RMR:one 5 mm polyp in descending colon  . Esophagogastroduodenoscopy (egd) with propofol N/A 07/05/2015    [id number]  . Esophageal dilation N/A 07/05/2015    Procedure: ESOPHAGEAL DILATION;  Surgeon: Daneil Dolin, MD;  Location: [facility] SU[facility];  Service: Endoscopy;  Laterality: N/A;  . Biopsy  07/05/2015    Procedure: BIOPSY;  Surgeon: Daneil Dolin, MD;  Location: [facility];  Service: Endoscopy;;  gastric polyp biopsy, ascending colon biopsy    Family History  Problem Relation Age of Onset  . Coronary artery disease Other     family Hx-sons  .  Cancer Other   . Stroke Other     family Hx  . Hypertension Other     family Hx  . Diabetes Brother   . Heart disease Son     before age 33  . Diabetes Son   . Stroke Daughter 11  . Stroke Mother    Social History:  reports that she quit smoking about 13 years ago. Her smoking use included Cigarettes. She started smoking about 67 years ago. She has a 64 pack-year smoking history. She has never used smokeless tobacco. She reports that she does not drink alcohol or use illicit drugs.  Allergies:  Allergies  Allergen Reactions  . Cephalosporins Diarrhea and Nausea Only    Lightheaded  . Levaquin [Levofloxacin In D5w] Swelling  . Macrodantin [Nitrofurantoin Macrocrystal] Swelling  . Phenothiazines Anaphylaxis and Hives  . Polysorbate Anaphylaxis  . Prednisone Shortness Of Breath  . Buspirone Itching  . Cardura [Doxazosin Mesylate] Itching  . Codeine Itching  . Prochlorperazine Other (See Comments)    unknown  . Ranexa [Ranolazine]     Severe drop in BP  . Atorvastatin Hives    Cramping; tolerates Crestor ok  . Ofloxacin Rash  . Other Itching and Rash    "WOOL"= make skin look like it has been burned  . Penicillins Other (See Comments)    Causes redness all over. Has patient had a PCN reaction causing immediate rash, facial/tongue/throat swelling, SOB or lightheadedness with hypotension: No Has patient had a PCN reaction causing severe rash involving mucus membranes or skin necrosis: No Has patient had a PCN reaction that required hospitalization No Has patient had a PCN reaction occurring within the last 10 years: No If all of the above answers are "NO", then may proceed with Cephalosporin use.   . Pimozide Hives and Itching    No prescriptions prior to admission    No results found for this or any previous visit (from the past 48 hour(s)). No results found.  Review of Systems  Constitutional: Negative.   Eyes: Negative.   Respiratory: Negative.   Cardiovascular:  Negative.   Gastrointestinal: Negative.   Genitourinary: Negative.   Musculoskeletal: Positive for back pain and neck pain.  Skin: Negative.   Neurological: Positive for sensory change and focal weakness.  Endo/Heme/Allergies: Negative.   Psychiatric/Behavioral: Positive for depression.    There were no vitals taken for this visit. Physical Exam hent, nl. Neck, pain with mobility. Cv, nl. Lungs, clear. Abdomen, nl. Extremities grade 1 edema. NEURO weakness of biceps ,deltois and triceps.  SLR positive at 60 degrees.   Assessment/Plan Decompression and fusion at l3-4, 4-5 with cages and pedicles screws. She is aware of risks and  benefits. She knows that this procedure will not affect the cervical pain.  Floyce Stakes, MD 09/23/2015, 5:51 PM

## 2015-09-24 ENCOUNTER — Inpatient Hospital Stay (HOSPITAL_COMMUNITY)
Admission: RE | Admit: 2015-09-24 | Discharge: 2015-10-08 | DRG: 459 | Disposition: A | Discharge status: Skilled Nursing Facility | Payer: Medicare Other | Source: Ambulatory Visit | Attending: Neurosurgery | Admitting: Neurosurgery

## 2015-09-24 ENCOUNTER — Inpatient Hospital Stay (HOSPITAL_COMMUNITY): Payer: Medicare Other | Admitting: Emergency Medicine

## 2015-09-24 ENCOUNTER — Inpatient Hospital Stay (HOSPITAL_COMMUNITY): Payer: Medicare Other

## 2015-09-24 ENCOUNTER — Inpatient Hospital Stay (HOSPITAL_COMMUNITY): Payer: Medicare Other | Admitting: Certified Registered"

## 2015-09-24 ENCOUNTER — Encounter (HOSPITAL_COMMUNITY)
Admission: RE | Disposition: A | Discharge status: Skilled Nursing Facility | Payer: Self-pay | Source: Ambulatory Visit | Attending: Neurosurgery

## 2015-09-24 DIAGNOSIS — I4729 Other ventricular tachycardia: Secondary | ICD-10-CM | POA: Insufficient documentation

## 2015-09-24 DIAGNOSIS — Z981 Arthrodesis status: Secondary | ICD-10-CM

## 2015-09-24 DIAGNOSIS — G970 Cerebrospinal fluid leak from spinal puncture: Secondary | ICD-10-CM | POA: Diagnosis not present

## 2015-09-24 DIAGNOSIS — I739 Peripheral vascular disease, unspecified: Secondary | ICD-10-CM | POA: Diagnosis present

## 2015-09-24 DIAGNOSIS — Z9049 Acquired absence of other specified parts of digestive tract: Secondary | ICD-10-CM

## 2015-09-24 DIAGNOSIS — Z885 Allergy status to narcotic agent status: Secondary | ICD-10-CM

## 2015-09-24 DIAGNOSIS — M4316 Spondylolisthesis, lumbar region: Secondary | ICD-10-CM | POA: Diagnosis present

## 2015-09-24 DIAGNOSIS — G9781 Other intraoperative complications of nervous system: Secondary | ICD-10-CM | POA: Diagnosis not present

## 2015-09-24 DIAGNOSIS — Z881 Allergy status to other antibiotic agents status: Secondary | ICD-10-CM

## 2015-09-24 DIAGNOSIS — Z880 Allergy status to penicillin: Secondary | ICD-10-CM | POA: Diagnosis not present

## 2015-09-24 DIAGNOSIS — R293 Abnormal posture: Secondary | ICD-10-CM | POA: Diagnosis not present

## 2015-09-24 DIAGNOSIS — F329 Major depressive disorder, single episode, unspecified: Secondary | ICD-10-CM | POA: Diagnosis present

## 2015-09-24 DIAGNOSIS — I209 Angina pectoris, unspecified: Secondary | ICD-10-CM | POA: Diagnosis not present

## 2015-09-24 DIAGNOSIS — R079 Chest pain, unspecified: Secondary | ICD-10-CM | POA: Diagnosis not present

## 2015-09-24 DIAGNOSIS — M5416 Radiculopathy, lumbar region: Secondary | ICD-10-CM | POA: Diagnosis present

## 2015-09-24 DIAGNOSIS — G039 Meningitis, unspecified: Secondary | ICD-10-CM | POA: Diagnosis not present

## 2015-09-24 DIAGNOSIS — B9689 Other specified bacterial agents as the cause of diseases classified elsewhere: Secondary | ICD-10-CM | POA: Diagnosis not present

## 2015-09-24 DIAGNOSIS — R5381 Other malaise: Secondary | ICD-10-CM | POA: Diagnosis not present

## 2015-09-24 DIAGNOSIS — I25119 Atherosclerotic heart disease of native coronary artery with unspecified angina pectoris: Secondary | ICD-10-CM | POA: Diagnosis not present

## 2015-09-24 DIAGNOSIS — J449 Chronic obstructive pulmonary disease, unspecified: Secondary | ICD-10-CM | POA: Diagnosis present

## 2015-09-24 DIAGNOSIS — I110 Hypertensive heart disease with heart failure: Secondary | ICD-10-CM | POA: Diagnosis present

## 2015-09-24 DIAGNOSIS — R488 Other symbolic dysfunctions: Secondary | ICD-10-CM | POA: Diagnosis not present

## 2015-09-24 DIAGNOSIS — I5032 Chronic diastolic (congestive) heart failure: Secondary | ICD-10-CM | POA: Diagnosis not present

## 2015-09-24 DIAGNOSIS — E785 Hyperlipidemia, unspecified: Secondary | ICD-10-CM | POA: Diagnosis present

## 2015-09-24 DIAGNOSIS — G9600 Cerebrospinal fluid leak, unspecified: Secondary | ICD-10-CM | POA: Diagnosis present

## 2015-09-24 DIAGNOSIS — M1990 Unspecified osteoarthritis, unspecified site: Secondary | ICD-10-CM | POA: Diagnosis not present

## 2015-09-24 DIAGNOSIS — G008 Other bacterial meningitis: Secondary | ICD-10-CM | POA: Diagnosis not present

## 2015-09-24 DIAGNOSIS — M79604 Pain in right leg: Secondary | ICD-10-CM | POA: Diagnosis not present

## 2015-09-24 DIAGNOSIS — M4806 Spinal stenosis, lumbar region: Secondary | ICD-10-CM | POA: Diagnosis not present

## 2015-09-24 DIAGNOSIS — F419 Anxiety disorder, unspecified: Secondary | ICD-10-CM | POA: Diagnosis present

## 2015-09-24 DIAGNOSIS — R0602 Shortness of breath: Secondary | ICD-10-CM | POA: Diagnosis not present

## 2015-09-24 DIAGNOSIS — Y838 Other surgical procedures as the cause of abnormal reaction of the patient, or of later complication, without mention of misadventure at the time of the procedure: Secondary | ICD-10-CM | POA: Diagnosis not present

## 2015-09-24 DIAGNOSIS — Z833 Family history of diabetes mellitus: Secondary | ICD-10-CM | POA: Diagnosis not present

## 2015-09-24 DIAGNOSIS — Z96651 Presence of right artificial knee joint: Secondary | ICD-10-CM | POA: Diagnosis not present

## 2015-09-24 DIAGNOSIS — K219 Gastro-esophageal reflux disease without esophagitis: Secondary | ICD-10-CM | POA: Diagnosis present

## 2015-09-24 DIAGNOSIS — Z9109 Other allergy status, other than to drugs and biological substances: Secondary | ICD-10-CM

## 2015-09-24 DIAGNOSIS — F410 Panic disorder [episodic paroxysmal anxiety] without agoraphobia: Secondary | ICD-10-CM | POA: Diagnosis present

## 2015-09-24 DIAGNOSIS — M4726 Other spondylosis with radiculopathy, lumbar region: Secondary | ICD-10-CM | POA: Diagnosis not present

## 2015-09-24 DIAGNOSIS — Z419 Encounter for procedure for purposes other than remedying health state, unspecified: Secondary | ICD-10-CM

## 2015-09-24 DIAGNOSIS — G009 Bacterial meningitis, unspecified: Secondary | ICD-10-CM | POA: Diagnosis not present

## 2015-09-24 DIAGNOSIS — Z48811 Encounter for surgical aftercare following surgery on the nervous system: Secondary | ICD-10-CM | POA: Diagnosis not present

## 2015-09-24 DIAGNOSIS — D500 Iron deficiency anemia secondary to blood loss (chronic): Secondary | ICD-10-CM | POA: Diagnosis not present

## 2015-09-24 DIAGNOSIS — I472 Ventricular tachycardia: Secondary | ICD-10-CM | POA: Diagnosis not present

## 2015-09-24 DIAGNOSIS — T814XXA Infection following a procedure, initial encounter: Secondary | ICD-10-CM | POA: Diagnosis not present

## 2015-09-24 DIAGNOSIS — I480 Paroxysmal atrial fibrillation: Secondary | ICD-10-CM | POA: Diagnosis not present

## 2015-09-24 DIAGNOSIS — R262 Difficulty in walking, not elsewhere classified: Secondary | ICD-10-CM | POA: Diagnosis not present

## 2015-09-24 DIAGNOSIS — E876 Hypokalemia: Secondary | ICD-10-CM | POA: Diagnosis not present

## 2015-09-24 DIAGNOSIS — Z87891 Personal history of nicotine dependence: Secondary | ICD-10-CM

## 2015-09-24 DIAGNOSIS — Z955 Presence of coronary angioplasty implant and graft: Secondary | ICD-10-CM

## 2015-09-24 DIAGNOSIS — I2510 Atherosclerotic heart disease of native coronary artery without angina pectoris: Secondary | ICD-10-CM | POA: Diagnosis not present

## 2015-09-24 DIAGNOSIS — Z8249 Family history of ischemic heart disease and other diseases of the circulatory system: Secondary | ICD-10-CM

## 2015-09-24 DIAGNOSIS — I2583 Coronary atherosclerosis due to lipid rich plaque: Secondary | ICD-10-CM | POA: Diagnosis present

## 2015-09-24 DIAGNOSIS — G9782 Other postprocedural complications and disorders of nervous system: Secondary | ICD-10-CM | POA: Diagnosis not present

## 2015-09-24 DIAGNOSIS — M6281 Muscle weakness (generalized): Secondary | ICD-10-CM | POA: Diagnosis not present

## 2015-09-24 DIAGNOSIS — Z823 Family history of stroke: Secondary | ICD-10-CM

## 2015-09-24 DIAGNOSIS — I252 Old myocardial infarction: Secondary | ICD-10-CM | POA: Diagnosis not present

## 2015-09-24 DIAGNOSIS — G9619 Other disorders of meninges, not elsewhere classified: Secondary | ICD-10-CM | POA: Diagnosis not present

## 2015-09-24 DIAGNOSIS — G030 Nonpyogenic meningitis: Secondary | ICD-10-CM | POA: Diagnosis not present

## 2015-09-24 DIAGNOSIS — Z982 Presence of cerebrospinal fluid drainage device: Secondary | ICD-10-CM | POA: Diagnosis not present

## 2015-09-24 DIAGNOSIS — I509 Heart failure, unspecified: Secondary | ICD-10-CM | POA: Diagnosis not present

## 2015-09-24 DIAGNOSIS — R279 Unspecified lack of coordination: Secondary | ICD-10-CM | POA: Diagnosis not present

## 2015-09-24 DIAGNOSIS — I10 Essential (primary) hypertension: Secondary | ICD-10-CM | POA: Diagnosis not present

## 2015-09-24 DIAGNOSIS — F411 Generalized anxiety disorder: Secondary | ICD-10-CM | POA: Diagnosis not present

## 2015-09-24 DIAGNOSIS — Z9889 Other specified postprocedural states: Secondary | ICD-10-CM | POA: Diagnosis not present

## 2015-09-24 SURGERY — POSTERIOR LUMBAR FUSION 2 LEVEL
Anesthesia: General

## 2015-09-24 MED ORDER — ACETAMINOPHEN 325 MG PO TABS: 650.0000 mg | ORAL_TABLET | ORAL | Status: DC | PRN

## 2015-09-24 MED ORDER — ALBUMIN HUMAN 5 % IV SOLN
INTRAVENOUS | Status: DC | PRN
  Administered 2015-09-24: 13:00:00 via INTRAVENOUS

## 2015-09-24 MED ORDER — PROPOFOL 10 MG/ML IV BOLUS
INTRAVENOUS | Status: DC | PRN
  Administered 2015-09-24: 130 mg via INTRAVENOUS

## 2015-09-24 MED ORDER — PHENYLEPHRINE HCL 10 MG/ML IJ SOLN
10.0000 mg | INTRAVENOUS | Status: DC | PRN
  Administered 2015-09-24: 20 ug/min via INTRAVENOUS

## 2015-09-24 MED ORDER — SENNA 8.6 MG PO TABS
1.0000 | ORAL_TABLET | Freq: Two times a day (BID) | ORAL | Status: DC
  Administered 2015-09-25 – 2015-10-08 (×19): 8.6 mg via ORAL
  Filled 2015-09-24 (×24): qty 1

## 2015-09-24 MED ORDER — SUGAMMADEX SODIUM 200 MG/2ML IV SOLN
INTRAVENOUS | Status: DC | PRN
  Administered 2015-09-24: 170 mg via INTRAVENOUS

## 2015-09-24 MED ORDER — MEPERIDINE HCL 25 MG/ML IJ SOLN: 6.2500 mg | INTRAMUSCULAR | Status: DC | PRN

## 2015-09-24 MED ORDER — ACETAMINOPHEN 650 MG RE SUPP: 650.0000 mg | RECTAL | Status: DC | PRN

## 2015-09-24 MED ORDER — THROMBIN 20000 UNITS EX SOLR
CUTANEOUS | Status: DC | PRN
  Administered 2015-09-24: 13:00:00 via TOPICAL

## 2015-09-24 MED ORDER — PHENYLEPHRINE 40 MCG/ML (10ML) SYRINGE FOR IV PUSH (FOR BLOOD PRESSURE SUPPORT)
PREFILLED_SYRINGE | INTRAVENOUS | Status: AC
  Filled 2015-09-24: qty 10

## 2015-09-24 MED ORDER — FLUTICASONE PROPIONATE 50 MCG/ACT NA SUSP
2.0000 | Freq: Every day | NASAL | Status: DC | PRN
  Filled 2015-09-24: qty 16

## 2015-09-24 MED ORDER — FENTANYL CITRATE (PF) 250 MCG/5ML IJ SOLN
INTRAMUSCULAR | Status: AC
  Filled 2015-09-24: qty 5

## 2015-09-24 MED ORDER — CARVEDILOL 12.5 MG PO TABS
12.5000 mg | ORAL_TABLET | Freq: Two times a day (BID) | ORAL | Status: DC
  Administered 2015-09-24 – 2015-10-01 (×13): 12.5 mg via ORAL
  Filled 2015-09-24 (×13): qty 1

## 2015-09-24 MED ORDER — THROMBIN 5000 UNITS EX SOLR
CUTANEOUS | Status: DC | PRN
  Administered 2015-09-24: 14:00:00 via TOPICAL

## 2015-09-24 MED ORDER — CARVEDILOL 12.5 MG PO TABS
12.5000 mg | ORAL_TABLET | ORAL | Status: AC
  Administered 2015-09-24: 12.5 mg via ORAL
  Filled 2015-09-24: qty 1

## 2015-09-24 MED ORDER — DULOXETINE HCL 60 MG PO CPEP
60.0000 mg | ORAL_CAPSULE | Freq: Every day | ORAL | Status: DC
  Administered 2015-09-24 – 2015-10-07 (×13): 60 mg via ORAL
  Filled 2015-09-24 (×13): qty 1

## 2015-09-24 MED ORDER — SODIUM CHLORIDE 0.9% FLUSH
3.0000 mL | Freq: Two times a day (BID) | INTRAVENOUS | Status: DC
  Administered 2015-09-24 – 2015-10-01 (×10): 3 mL via INTRAVENOUS
  Administered 2015-10-02: 6 mL via INTRAVENOUS
  Administered 2015-10-02 – 2015-10-07 (×8): 3 mL via INTRAVENOUS

## 2015-09-24 MED ORDER — VANCOMYCIN HCL IN DEXTROSE 1-5 GM/200ML-% IV SOLN
1000.0000 mg | Freq: Once | INTRAVENOUS | Status: AC
  Administered 2015-09-25: 1000 mg via INTRAVENOUS
  Filled 2015-09-24 (×2): qty 200

## 2015-09-24 MED ORDER — FENTANYL CITRATE (PF) 100 MCG/2ML IJ SOLN
INTRAMUSCULAR | Status: AC
  Filled 2015-09-24: qty 2

## 2015-09-24 MED ORDER — EPHEDRINE SULFATE 50 MG/ML IJ SOLN
INTRAMUSCULAR | Status: DC | PRN
  Administered 2015-09-24 (×2): 10 mg via INTRAVENOUS
  Administered 2015-09-24: 5 mg via INTRAVENOUS

## 2015-09-24 MED ORDER — HEMOSTATIC AGENTS (NO CHARGE) OPTIME
TOPICAL | Status: DC | PRN
  Administered 2015-09-24: 1 via TOPICAL

## 2015-09-24 MED ORDER — LOPERAMIDE HCL 2 MG PO CAPS
2.0000 mg | ORAL_CAPSULE | Freq: Four times a day (QID) | ORAL | Status: DC | PRN
  Filled 2015-09-24: qty 1

## 2015-09-24 MED ORDER — SODIUM CHLORIDE 0.9% FLUSH: 3.0000 mL | INTRAVENOUS | Status: DC | PRN

## 2015-09-24 MED ORDER — PROPOFOL 10 MG/ML IV BOLUS
INTRAVENOUS | Status: AC
  Filled 2015-09-24: qty 20

## 2015-09-24 MED ORDER — LACTATED RINGERS IV SOLN
INTRAVENOUS | Status: DC
  Administered 2015-09-24 (×2): via INTRAVENOUS

## 2015-09-24 MED ORDER — 0.9 % SODIUM CHLORIDE (POUR BTL) OPTIME
TOPICAL | Status: DC | PRN
  Administered 2015-09-24: 1000 mL

## 2015-09-24 MED ORDER — ONDANSETRON HCL 4 MG/2ML IJ SOLN: 4.0000 mg | Freq: Once | INTRAMUSCULAR | Status: DC | PRN

## 2015-09-24 MED ORDER — SODIUM CHLORIDE 0.9 % IV SOLN
INTRAVENOUS | Status: DC
  Administered 2015-09-24 – 2015-09-27 (×3): via INTRAVENOUS
  Administered 2015-09-27: 75 mL/h via INTRAVENOUS
  Administered 2015-09-27 – 2015-10-04 (×10): via INTRAVENOUS

## 2015-09-24 MED ORDER — PHENOL 1.4 % MT LIQD
1.0000 | OROMUCOSAL | Status: DC | PRN
  Administered 2015-09-25: 1 via OROMUCOSAL
  Filled 2015-09-24: qty 177

## 2015-09-24 MED ORDER — FENTANYL CITRATE (PF) 100 MCG/2ML IJ SOLN
INTRAMUSCULAR | Status: DC | PRN
  Administered 2015-09-24: 50 ug via INTRAVENOUS
  Administered 2015-09-24: 100 ug via INTRAVENOUS
  Administered 2015-09-24 (×5): 50 ug via INTRAVENOUS

## 2015-09-24 MED ORDER — DIAZEPAM 5 MG PO TABS
5.0000 mg | ORAL_TABLET | Freq: Four times a day (QID) | ORAL | Status: DC | PRN
  Administered 2015-09-24 – 2015-10-07 (×12): 5 mg via ORAL
  Filled 2015-09-24 (×11): qty 1

## 2015-09-24 MED ORDER — LIDOCAINE HCL (CARDIAC) 20 MG/ML IV SOLN
INTRAVENOUS | Status: DC | PRN
  Administered 2015-09-24: 100 mg via INTRAVENOUS

## 2015-09-24 MED ORDER — VANCOMYCIN HCL 1000 MG IV SOLR
INTRAVENOUS | Status: AC
  Filled 2015-09-24: qty 1000

## 2015-09-24 MED ORDER — DIAZEPAM 5 MG PO TABS
ORAL_TABLET | ORAL | Status: AC
  Filled 2015-09-24: qty 1

## 2015-09-24 MED ORDER — ROCURONIUM BROMIDE 100 MG/10ML IV SOLN
INTRAVENOUS | Status: DC | PRN
  Administered 2015-09-24: 50 mg via INTRAVENOUS

## 2015-09-24 MED ORDER — OXYCODONE-ACETAMINOPHEN 5-325 MG PO TABS
1.0000 | ORAL_TABLET | ORAL | Status: DC | PRN
  Administered 2015-09-25: 2 via ORAL
  Administered 2015-09-25: 1 via ORAL
  Administered 2015-09-25 – 2015-09-26 (×4): 2 via ORAL
  Administered 2015-09-26: 1 via ORAL
  Administered 2015-09-27 – 2015-10-01 (×14): 2 via ORAL
  Administered 2015-10-02: 1 via ORAL
  Administered 2015-10-02: 2 via ORAL
  Administered 2015-10-03: 1 via ORAL
  Administered 2015-10-03: 2 via ORAL
  Administered 2015-10-03: 1 via ORAL
  Administered 2015-10-04: 2 via ORAL
  Administered 2015-10-04 – 2015-10-05 (×3): 1 via ORAL
  Administered 2015-10-06 – 2015-10-07 (×3): 2 via ORAL
  Administered 2015-10-07 – 2015-10-08 (×3): 1 via ORAL
  Administered 2015-10-08: 2 via ORAL
  Filled 2015-09-24: qty 1
  Filled 2015-09-24 (×3): qty 2
  Filled 2015-09-24 (×2): qty 1
  Filled 2015-09-24 (×3): qty 2
  Filled 2015-09-24 (×2): qty 1
  Filled 2015-09-24 (×2): qty 2
  Filled 2015-09-24 (×3): qty 1
  Filled 2015-09-24 (×4): qty 2
  Filled 2015-09-24: qty 1
  Filled 2015-09-24 (×5): qty 2
  Filled 2015-09-24: qty 1
  Filled 2015-09-24 (×8): qty 2
  Filled 2015-09-24: qty 1
  Filled 2015-09-24: qty 2

## 2015-09-24 MED ORDER — BACITRACIN ZINC 500 UNIT/GM EX OINT
TOPICAL_OINTMENT | CUTANEOUS | Status: DC | PRN
  Administered 2015-09-24: 1 via TOPICAL

## 2015-09-24 MED ORDER — VANCOMYCIN HCL 1000 MG IV SOLR
INTRAVENOUS | Status: DC | PRN
  Administered 2015-09-24: 1000 mg via TOPICAL

## 2015-09-24 MED ORDER — ASPIRIN EC 81 MG PO TBEC
81.0000 mg | DELAYED_RELEASE_TABLET | Freq: Every morning | ORAL | Status: DC
  Administered 2015-09-25 – 2015-10-08 (×14): 81 mg via ORAL
  Filled 2015-09-24 (×15): qty 1

## 2015-09-24 MED ORDER — SODIUM CHLORIDE 0.9 % IV SOLN: 250.0000 mL | INTRAVENOUS | Status: DC

## 2015-09-24 MED ORDER — VANCOMYCIN HCL IN DEXTROSE 1-5 GM/200ML-% IV SOLN
1000.0000 mg | INTRAVENOUS | Status: AC
  Administered 2015-09-24: 1000 mg via INTRAVENOUS
  Filled 2015-09-24: qty 200

## 2015-09-24 MED ORDER — FUROSEMIDE 20 MG PO TABS
20.0000 mg | ORAL_TABLET | Freq: Every day | ORAL | Status: DC
  Administered 2015-09-25 – 2015-10-08 (×13): 20 mg via ORAL
  Filled 2015-09-24 (×13): qty 1

## 2015-09-24 MED ORDER — MORPHINE SULFATE (PF) 2 MG/ML IV SOLN
1.0000 mg | INTRAVENOUS | Status: DC | PRN
  Administered 2015-09-24 – 2015-10-03 (×22): 2 mg via INTRAVENOUS
  Filled 2015-09-24: qty 2
  Filled 2015-09-24 (×23): qty 1

## 2015-09-24 MED ORDER — LIDOCAINE 2% (20 MG/ML) 5 ML SYRINGE
INTRAMUSCULAR | Status: AC
  Filled 2015-09-24: qty 5

## 2015-09-24 MED ORDER — NITROGLYCERIN 0.4 MG SL SUBL
0.4000 mg | SUBLINGUAL_TABLET | SUBLINGUAL | Status: DC | PRN
  Administered 2015-10-01 – 2015-10-04 (×5): 0.4 mg via SUBLINGUAL
  Filled 2015-09-24 (×6): qty 1

## 2015-09-24 MED ORDER — ONDANSETRON HCL 4 MG/2ML IJ SOLN: 4.0000 mg | INTRAMUSCULAR | Status: DC | PRN

## 2015-09-24 MED ORDER — MENTHOL 3 MG MT LOZG
1.0000 | LOZENGE | OROMUCOSAL | Status: DC | PRN
Start: 2015-09-24 — End: 2015-10-08

## 2015-09-24 MED ORDER — EPHEDRINE 5 MG/ML INJ
INTRAVENOUS | Status: AC
  Filled 2015-09-24: qty 10

## 2015-09-24 MED ORDER — FENTANYL CITRATE (PF) 100 MCG/2ML IJ SOLN
25.0000 ug | INTRAMUSCULAR | Status: DC | PRN
  Administered 2015-09-24 (×3): 25 ug via INTRAVENOUS

## 2015-09-24 MED ORDER — ROCURONIUM BROMIDE 50 MG/5ML IV SOLN
INTRAVENOUS | Status: AC
  Filled 2015-09-24: qty 1

## 2015-09-24 MED FILL — Heparin Sodium (Porcine) Inj 1000 Unit/ML: INTRAMUSCULAR | Qty: 30 | Status: AC

## 2015-09-24 MED FILL — Sodium Chloride IV Soln 0.9%: INTRAVENOUS | Qty: 3000 | Status: AC

## 2015-09-24 SURGICAL SUPPLY — 77 items
APL SKNCLS STERI-STRIP NONHPOA (GAUZE/BANDAGES/DRESSINGS) ×1
APL SRG 60D 8 XTD TIP BNDBL (TIP) ×1
BENZOIN TINCTURE PRP APPL 2/3 (GAUZE/BANDAGES/DRESSINGS) ×3 IMPLANT
BLADE CLIPPER SURG (BLADE) IMPLANT
BUR ACORN 6.0 (BURR) ×3 IMPLANT
BUR ACORN 6.0MM (BURR) ×2
BUR MATCHSTICK NEURO 3.0 LAGG (BURR) ×3 IMPLANT
CANISTER SUCT 3000ML PPV (MISCELLANEOUS) ×3 IMPLANT
CAP LOCKING THREADED (Cap) ×12 IMPLANT
CLOSURE WOUND 1/2 X4 (GAUZE/BANDAGES/DRESSINGS) ×1
CONT SPEC 4OZ CLIKSEAL STRL BL (MISCELLANEOUS) ×3 IMPLANT
COVER BACK TABLE 60X90IN (DRAPES) ×3 IMPLANT
DRAPE C-ARM 42X72 X-RAY (DRAPES) ×8 IMPLANT
DRAPE LAPAROTOMY 100X72X124 (DRAPES) ×3 IMPLANT
DRAPE POUCH INSTRU U-SHP 10X18 (DRAPES) ×3 IMPLANT
DRAPE PROXIMA HALF (DRAPES) ×2 IMPLANT
DRSG OPSITE POSTOP 4X10 (GAUZE/BANDAGES/DRESSINGS) ×2 IMPLANT
DRSG PAD ABDOMINAL 8X10 ST (GAUZE/BANDAGES/DRESSINGS) IMPLANT
DURAPREP 26ML APPLICATOR (WOUND CARE) ×3 IMPLANT
DURASEAL APPLICATOR TIP (TIP) ×2 IMPLANT
DURASEAL SPINE SEALANT 3ML (MISCELLANEOUS) ×2 IMPLANT
ELECT BLADE 4.0 EZ CLEAN MEGAD (MISCELLANEOUS) ×3
ELECT REM PT RETURN 9FT ADLT (ELECTROSURGICAL) ×3
ELECTRODE BLDE 4.0 EZ CLN MEGD (MISCELLANEOUS) IMPLANT
ELECTRODE REM PT RTRN 9FT ADLT (ELECTROSURGICAL) ×1 IMPLANT
EVACUATOR 1/8 PVC DRAIN (DRAIN) ×2 IMPLANT
GAUZE SPONGE 4X4 12PLY STRL (GAUZE/BANDAGES/DRESSINGS) ×3 IMPLANT
GAUZE SPONGE 4X4 16PLY XRAY LF (GAUZE/BANDAGES/DRESSINGS) ×3 IMPLANT
GLOVE BIOGEL M 8.0 STRL (GLOVE) ×5 IMPLANT
GLOVE EXAM NITRILE LRG STRL (GLOVE) IMPLANT
GLOVE EXAM NITRILE MD LF STRL (GLOVE) IMPLANT
GLOVE EXAM NITRILE XL STR (GLOVE) IMPLANT
GLOVE EXAM NITRILE XS STR PU (GLOVE) IMPLANT
GOWN STRL REUS W/ TWL LRG LVL3 (GOWN DISPOSABLE) ×1 IMPLANT
GOWN STRL REUS W/ TWL XL LVL3 (GOWN DISPOSABLE) IMPLANT
GOWN STRL REUS W/TWL 2XL LVL3 (GOWN DISPOSABLE) IMPLANT
GOWN STRL REUS W/TWL LRG LVL3 (GOWN DISPOSABLE) ×6
GOWN STRL REUS W/TWL XL LVL3 (GOWN DISPOSABLE)
GRAFT DURAGEN MATRIX 2WX2L ×2 IMPLANT
HEMOSTAT POWDER KIT SURGIFOAM (HEMOSTASIS) ×2 IMPLANT
KIT BASIN OR (CUSTOM PROCEDURE TRAY) ×3 IMPLANT
KIT INFUSE LRG II (Orthopedic Implant) ×2 IMPLANT
KIT ROOM TURNOVER OR (KITS) ×3 IMPLANT
NDL HYPO 18GX1.5 BLUNT FILL (NEEDLE) IMPLANT
NDL HYPO 21X1.5 SAFETY (NEEDLE) IMPLANT
NDL HYPO 25X1 1.5 SAFETY (NEEDLE) IMPLANT
NEEDLE HYPO 18GX1.5 BLUNT FILL (NEEDLE) IMPLANT
NEEDLE HYPO 21X1.5 SAFETY (NEEDLE) ×3 IMPLANT
NEEDLE HYPO 25X1 1.5 SAFETY (NEEDLE) ×3 IMPLANT
NS IRRIG 1000ML POUR BTL (IV SOLUTION) ×3 IMPLANT
PACK LAMINECTOMY NEURO (CUSTOM PROCEDURE TRAY) ×3 IMPLANT
PAD ARMBOARD 7.5X6 YLW CONV (MISCELLANEOUS) ×9 IMPLANT
PATTIES SURGICAL .5 X1 (DISPOSABLE) ×1 IMPLANT
PATTIES SURGICAL .5 X3 (DISPOSABLE) IMPLANT
PATTIES SURGICAL 1X1 (DISPOSABLE) ×4 IMPLANT
ROD 65MM SPINAL (Rod) ×2 IMPLANT
ROD 70MM SPINAL (Rod) ×2 IMPLANT
ROD SPNL 5.5 CREO TI 65 (Rod) IMPLANT
SCREW CREO THREADED 5.5X45MM (Screw) ×10 IMPLANT
SCREW SPINE 40X5.5XPA CREO (Screw) IMPLANT
SCREW SPINE CREO 5.5X40 (Screw) ×3 IMPLANT
SPONGE LAP 4X18 X RAY DECT (DISPOSABLE) IMPLANT
SPONGE NEURO XRAY DETECT 1X3 (DISPOSABLE) IMPLANT
SPONGE SURGIFOAM ABS GEL 100 (HEMOSTASIS) ×3 IMPLANT
STAPLER VISISTAT 35W (STAPLE) ×2 IMPLANT
STRIP CLOSURE SKIN 1/2X4 (GAUZE/BANDAGES/DRESSINGS) ×2 IMPLANT
SUT VIC AB 1 CT1 18XBRD ANBCTR (SUTURE) ×2 IMPLANT
SUT VIC AB 1 CT1 8-18 (SUTURE) ×6
SUT VIC AB 2-0 CP2 18 (SUTURE) ×5 IMPLANT
SUT VIC AB 3-0 SH 8-18 (SUTURE) ×3 IMPLANT
SUT VIC AB 4-0 P-3 18X BRD (SUTURE) IMPLANT
SUT VIC AB 4-0 P3 18 (SUTURE) ×3
SYR 5ML LL (SYRINGE) IMPLANT
TOWEL OR 17X24 6PK STRL BLUE (TOWEL DISPOSABLE) ×3 IMPLANT
TOWEL OR 17X26 10 PK STRL BLUE (TOWEL DISPOSABLE) ×3 IMPLANT
TRAY FOLEY W/METER SILVER 16FR (SET/KITS/TRAYS/PACK) ×3 IMPLANT
WATER STERILE IRR 1000ML POUR (IV SOLUTION) ×3 IMPLANT

## 2015-09-24 NOTE — Progress Notes (Signed)
Patient ID: [name], female   DOB: 08/10/1932, 82 y.o.   MRN: [id number] C/o incisional pai, no weakness

## 2015-09-24 NOTE — Anesthesia Procedure Notes (Signed)
Procedure Name: Intubation Date/Time: 09/24/2015 11:20 AM Performed by: Lance Coon Pre-anesthesia Checklist: Patient identified, Timeout performed, Emergency Drugs available, Suction available and Patient being monitored Patient Re-evaluated:Patient Re-evaluated prior to inductionOxygen Delivery Method: Circle system utilized Preoxygenation: Pre-oxygenation with 100% oxygen Intubation Type: IV induction Ventilation: Mask ventilation without difficulty and Oral airway inserted - appropriate to patient size Laryngoscope Size: Sabra Heck and 2 Grade View: Grade I Tube type: Oral Number of attempts: 1 Airway Equipment and Method: Stylet Placement Confirmation: ETT inserted through vocal cords under direct vision,  positive ETCO2 and breath sounds checked- equal and bilateral Secured at: 21 cm Tube secured with: Tape Dental Injury: Teeth and Oropharynx as per pre-operative assessment

## 2015-09-24 NOTE — Anesthesia Postprocedure Evaluation (Signed)
Anesthesia Post Note  Patient: [name]  Procedure(s) Performed: Procedure(s) (LRB): Lumbar three- four Lumbar four-five LUMBAR FUSION WITH PEDICLE SCREWS (N/A)  Patient location during evaluation: PACU Anesthesia Type: General Level of consciousness: awake and alert Pain management: pain level controlled Vital Signs Assessment: post-procedure vital signs reviewed and stable Respiratory status: spontaneous breathing, nonlabored ventilation, respiratory function stable and patient connected to nasal cannula oxygen Cardiovascular status: blood pressure returned to baseline and stable Postop Assessment: no signs of nausea or vomiting Anesthetic complications: no    Last Vitals:  Filed Vitals:   09/24/15 1519 09/24/15 1545  BP: 128/88   Pulse: 82 82  Temp:    Resp: 17 12    Last Pain:  Filed Vitals:   09/24/15 1553  PainSc: 7                  Zenaida Deed

## 2015-09-24 NOTE — Progress Notes (Signed)
Pt arrives to unit post op at 18:12pm with no noted distress. Surgical dressing in place. Hemovac in place, with no charge per report. Pt noted on flat bedrest per MD order. Safety measures in place. Pt oriented to room. Call bell within reach. Will continue to monitor.

## 2015-09-24 NOTE — Anesthesia Preprocedure Evaluation (Addendum)
Anesthesia Evaluation  Patient identified by MRN, date of birth, ID band Patient awake    Reviewed: Allergy & Precautions, H&P , Patient's Chart, lab work & pertinent test results, reviewed documented beta blocker date and time   Airway Mallampati: II  TM Distance: >3 FB Neck ROM: full    Dental no notable dental hx.    Pulmonary former smoker,    Pulmonary exam normal breath sounds clear to auscultation       Cardiovascular hypertension,  Rhythm:regular Rate:Normal     Neuro/Psych    GI/Hepatic   Endo/Other    Renal/GU      Musculoskeletal   Abdominal   Peds  Hematology   Anesthesia Other Findings HTN  Junctional rhythm, low voltage  MI  2006 55% EF on 4/16 echo   GERD     Sleep disorder never had scheduled OSA test  COPD    Complication of anesthesia    Anxiety   Depression    Pneumonia 12/2011    CHF [facility] l-ll, doesnt leave the house   Reproductive/Obstetrics                           Anesthesia Physical Anesthesia Plan  ASA: II  Anesthesia Plan: General   Post-op Pain Management:    Induction: Intravenous  Airway Management Planned: Oral ETT  Additional Equipment:   Intra-op Plan:   Post-operative Plan: Extubation in OR  Informed Consent: I have reviewed the patients History and Physical, chart, labs and discussed the procedure including the risks, benefits and alternatives for the proposed anesthesia with the patient or authorized representative who has indicated his/her understanding and acceptance.   Dental Advisory Given and Dental advisory given  Plan Discussed with: CRNA and Surgeon  Anesthesia Plan Comments: (  Discussed general anesthesia, including possible nausea, instrumentation of airway, sore throat,pulmonary aspiration, etc. I asked if the were any outstanding questions, or  concerns before we proceeded. )        Anesthesia Quick  Evaluation

## 2015-09-24 NOTE — Transfer of Care (Signed)
Immediate Anesthesia Transfer of Care Note  Patient: [name]  Procedure(s) Performed: Procedure(s) with comments: Lumbar three- four Lumbar four-five LUMBAR FUSION WITH PEDICLE SCREWS (N/A) - L3-4 L4-5 LUMBAR FUSION WITH PEDICLE SCREWS  Patient Location: PACU  Anesthesia Type:General  Level of Consciousness: awake, alert  and patient cooperative  Airway & Oxygen Therapy: Patient Spontanous Breathing and Patient connected to face mask oxygen  Post-op Assessment: Report given to RN, Post -op Vital signs reviewed and stable and Patient moving all extremities X 4  Post vital signs: Reviewed and stable  Last Vitals:  Filed Vitals:   09/24/15 1516 09/24/15 1519  BP:  128/88  Pulse: 82 82  Temp: 36 C   Resp:  17    Last Pain:  Filed Vitals:   09/24/15 1522  PainSc: 7          Complications: No apparent anesthesia complications

## 2015-09-25 MED ORDER — HEPARIN SODIUM (PORCINE) 5000 UNIT/ML IJ SOLN
5000.0000 [IU] | Freq: Three times a day (TID) | INTRAMUSCULAR | Status: DC
  Administered 2015-09-25 – 2015-10-08 (×35): 5000 [IU] via SUBCUTANEOUS
  Filled 2015-09-25 (×35): qty 1

## 2015-09-25 NOTE — Progress Notes (Signed)
Serosanguineous drainage has been oozing from the incision. The hemovac came out due to excessive patient's movement in bed. Dressing changed and reinforced. Neurosurgery notified.

## 2015-09-25 NOTE — Progress Notes (Signed)
Paged ortho tech for patient's brace. Awaiting reply.

## 2015-09-25 NOTE — Progress Notes (Signed)
Received a call back form ortho tech.

## 2015-09-25 NOTE — Progress Notes (Signed)
Patient ID: [name], female   DOB: [birth year], 82 y.o.   MRN: [id number] No weakness. C/o incisional pain. Wound dry. To star heparin . Will get flat till am

## 2015-09-25 NOTE — Progress Notes (Signed)
Orthopedic Tech Progress Note Patient Details:  SETAREH LOIACONO 07/03/1932 [id number] Called in b io-tech brace order; spoke with Jeanne Ivan, Shenouda Genova 09/25/2015, 2:05 PM

## 2015-09-25 NOTE — Op Note (Signed)
[name], BING NO.:  [id number]  MEDICAL RECORD NO.:  [id number]  LOCATION:  5M12C                        FACILITY:  [facility]  PHYSICIAN:  Leeroy Cha, M.D.   DATE OF BIRTH:  01/13/1933  DATE OF PROCEDURE:  09/24/2015 DATE OF DISCHARGE:                              OPERATIVE REPORT   PREOPERATIVE DIAGNOSES:  Chronic radiculopathy, facet arthropathy, spondylolisthesis 3-4, L4-5, status post decompression 4 years ago.  POSTOPERATIVE DIAGNOSIS:  Chronic radiculopathy, facet arthropathy, spondylolisthesis 3-4, 4-5, status post decompression 4 years ago.  PROCEDURE:  Bilaterally 3 and 4 facetectomy, lysis of adhesions, decompression of the thecal sac, foraminotomy to decompress the L3-L4 and L5 nerve roots, pedicle screws L3-L4-L5, posterolateral arthrodesis with BMP and autograft.  Cell Saver.  C-arm.  SURGEON:  Leeroy Cha, M.D.  ASSISTANT:  Dr. Cyndy Freeze.  CLINICAL HISTORY:  The patient is a lady who in the past underwent surgery with decompression.  Did well but lately, she had been complaining of back pain worse to both legs.  X-ray showed recurrent facet arthropathy with foraminal stenosis, also regrowth of the lamina, step-off L3-L4, L4-L5.  The patient wants to go ahead with surgery in view of no improvement with medical treatment.  The patient had been on anticoagulants.  She and her family knew the risk and benefits of surgery.  DESCRIPTION OF PROCEDURE:  The patient was taken to the OR, and after intubation, she was positioned in a prone manner.  The back was cleaned with Betadine and DuraPrep.  Drapes were applied.  Midline incision following the previous one was made.  Immediately what we found was that the patient had almost no anatomy.  The patient had quite a bit of scar tissue immediately from the adipose tissue down.  The fascia was opened in the midline.  We were able to retract all the way laterally.  We took 3 x-rays just to be  sure that we were at the area we wanted which was L3- L4, L4-L5.  Then at the midline, we started drilling regrowth of the lamina of 3 and 4 and the upper part of 5.  The patient had quite a bit of adhesion, lysis was accomplished.  This took [place] about 25% of the procedure.  At the end, we were able to identify all the facets.  We removed the facet of 3 and 4 bilaterally.  Then, with the drill as well as the Kerrison punch, we decompressed the foramen to free the L3-L4 and L5.  The narrowing was secondary to a combination of scar tissue plus facet arthropathy.  Having done this, with the C-arm in AP view and then a lateral view, we made holes in the pedicle of L3-L4 and L5.  Prior to introduction of the pedicle screws, we feel all quadrants just to be sure we were surrounded.  We inserted 4 pedicle screws of 5.5 x 45 and 2 of them 5.5 x 40.  They were connected with a rod, 2 of them and caps. Then, we went laterally and we removed the periosteum of the lateral aspect of the facet of 3-4 and 4-5 as well at the takeoff of the transverse process.  Mixture of BMP and autograft was done as arthrodesis.  Valsalva maneuver was negative. Nevertheless, we left surgical glue as well as DuraGen in the epidural space.  A drain was left.  Vancomycin was left in the operative site and the wound was closed with several layers of Vicryl and staples.          ______________________________ Leeroy Cha, M.D.     EB/MEDQ  D:  09/24/2015  T:  09/25/2015  Job:  [id number]

## 2015-09-26 NOTE — Progress Notes (Signed)
Patient ID: KELBI CUTLIP, female   DOB: Jul 24, 1932, 82 y.o.   MRN: [id number] BP 127/44 mmHg  Pulse 83  Temp(Src) 98.4 F (36.9 C) (Axillary)  Resp 18  Ht 5\' 1"  (1.549 m)  Wt 81.279 kg (179 lb 3 oz)  BMI 33.87 kg/m2  SpO2 99% Alert, confused Moving all extremities Wound is clean, dry, no signs of infection.  Down for another day.

## 2015-09-26 NOTE — Progress Notes (Signed)
Patient ID: [name], female   DOB: March 30, 1933, 82 y.o.   MRN: [id number] Stable, no headache,no weakness. . Wound dry. It was draining this am. Hob down to 8 degrees

## 2015-09-27 ENCOUNTER — Encounter (HOSPITAL_COMMUNITY)
Admission: RE | Disposition: A | Discharge status: Skilled Nursing Facility | Payer: Self-pay | Source: Ambulatory Visit | Attending: Neurosurgery

## 2015-09-27 ENCOUNTER — Inpatient Hospital Stay (HOSPITAL_COMMUNITY): Payer: Medicare Other | Admitting: Anesthesiology

## 2015-09-27 DIAGNOSIS — G9600 Cerebrospinal fluid leak, unspecified: Secondary | ICD-10-CM | POA: Diagnosis present

## 2015-09-27 HISTORY — PX: LUMBAR WOUND DEBRIDEMENT: SHX1988

## 2015-09-27 SURGERY — LUMBAR WOUND DEBRIDEMENT
Anesthesia: General | Site: Back

## 2015-09-27 MED ORDER — ETOMIDATE 2 MG/ML IV SOLN
INTRAVENOUS | Status: AC
  Filled 2015-09-27: qty 10

## 2015-09-27 MED ORDER — ROCURONIUM BROMIDE 50 MG/5ML IV SOLN
INTRAVENOUS | Status: AC
  Filled 2015-09-27: qty 1

## 2015-09-27 MED ORDER — SUCCINYLCHOLINE CHLORIDE 200 MG/10ML IV SOSY
PREFILLED_SYRINGE | INTRAVENOUS | Status: AC
  Filled 2015-09-27: qty 10

## 2015-09-27 MED ORDER — SUFENTANIL CITRATE 50 MCG/ML IV SOLN
INTRAVENOUS | Status: DC | PRN
  Administered 2015-09-27 (×2): 10 ug via INTRAVENOUS

## 2015-09-27 MED ORDER — ROCURONIUM BROMIDE 100 MG/10ML IV SOLN
INTRAVENOUS | Status: DC | PRN
  Administered 2015-09-27: 50 mg via INTRAVENOUS

## 2015-09-27 MED ORDER — VANCOMYCIN HCL IN DEXTROSE 1-5 GM/200ML-% IV SOLN
INTRAVENOUS | Status: AC
  Administered 2015-09-27: 1000 mg via INTRAVENOUS
  Filled 2015-09-27: qty 200

## 2015-09-27 MED ORDER — ONDANSETRON HCL 4 MG/2ML IJ SOLN
INTRAMUSCULAR | Status: AC
  Filled 2015-09-27: qty 2

## 2015-09-27 MED ORDER — PHENYLEPHRINE 40 MCG/ML (10ML) SYRINGE FOR IV PUSH (FOR BLOOD PRESSURE SUPPORT)
PREFILLED_SYRINGE | INTRAVENOUS | Status: AC
  Filled 2015-09-27: qty 10

## 2015-09-27 MED ORDER — SUGAMMADEX SODIUM 200 MG/2ML IV SOLN
INTRAVENOUS | Status: DC | PRN
  Administered 2015-09-27: 200 mg via INTRAVENOUS

## 2015-09-27 MED ORDER — ONDANSETRON HCL 4 MG/2ML IJ SOLN: 4.0000 mg | Freq: Once | INTRAMUSCULAR | Status: DC | PRN

## 2015-09-27 MED ORDER — VANCOMYCIN HCL IN DEXTROSE 1-5 GM/200ML-% IV SOLN
1000.0000 mg | INTRAVENOUS | Status: DC
  Administered 2015-09-28 – 2015-10-01 (×4): 1000 mg via INTRAVENOUS
  Filled 2015-09-27 (×4): qty 200

## 2015-09-27 MED ORDER — HYDROCODONE-ACETAMINOPHEN 7.5-325 MG PO TABS: 1.0000 | ORAL_TABLET | Freq: Once | ORAL | Status: DC | PRN

## 2015-09-27 MED ORDER — PROPOFOL 10 MG/ML IV BOLUS
INTRAVENOUS | Status: AC
  Filled 2015-09-27: qty 20

## 2015-09-27 MED ORDER — LACTATED RINGERS IV SOLN
INTRAVENOUS | Status: DC | PRN
  Administered 2015-09-27: 20:00:00 via INTRAVENOUS

## 2015-09-27 MED ORDER — THROMBIN 20000 UNITS EX SOLR
CUTANEOUS | Status: DC | PRN
  Administered 2015-09-27: 20 mL

## 2015-09-27 MED ORDER — 0.9 % SODIUM CHLORIDE (POUR BTL) OPTIME
TOPICAL | Status: DC | PRN
Start: 2015-09-27 — End: 2015-09-27
  Administered 2015-09-27: 1000 mL

## 2015-09-27 MED ORDER — FENTANYL CITRATE (PF) 100 MCG/2ML IJ SOLN: 25.0000 ug | INTRAMUSCULAR | Status: DC | PRN

## 2015-09-27 MED ORDER — SODIUM CHLORIDE 0.9 % IJ SOLN
INTRAMUSCULAR | Status: AC
  Filled 2015-09-27: qty 10

## 2015-09-27 MED ORDER — ONDANSETRON HCL 4 MG/2ML IJ SOLN
INTRAMUSCULAR | Status: DC | PRN
  Administered 2015-09-27: 4 mg via INTRAVENOUS

## 2015-09-27 MED ORDER — PROPOFOL 10 MG/ML IV BOLUS
INTRAVENOUS | Status: DC | PRN
  Administered 2015-09-27: 100 mg via INTRAVENOUS

## 2015-09-27 MED ORDER — PHENYLEPHRINE HCL 10 MG/ML IJ SOLN
20.0000 mg | INTRAVENOUS | Status: DC | PRN
  Administered 2015-09-27: 20 ug/min via INTRAVENOUS

## 2015-09-27 MED ORDER — LIDOCAINE 2% (20 MG/ML) 5 ML SYRINGE
INTRAMUSCULAR | Status: AC
  Filled 2015-09-27: qty 5

## 2015-09-27 MED ORDER — SUGAMMADEX SODIUM 200 MG/2ML IV SOLN
INTRAVENOUS | Status: AC
  Filled 2015-09-27: qty 2

## 2015-09-27 MED ORDER — SUFENTANIL CITRATE 50 MCG/ML IV SOLN
INTRAVENOUS | Status: AC
  Filled 2015-09-27: qty 1

## 2015-09-27 MED ORDER — PHENYLEPHRINE HCL 10 MG/ML IJ SOLN
INTRAMUSCULAR | Status: DC | PRN
  Administered 2015-09-27: 80 ug via INTRAVENOUS

## 2015-09-27 SURGICAL SUPPLY — 70 items
APL SKNCLS STERI-STRIP NONHPOA (GAUZE/BANDAGES/DRESSINGS)
APL SRG 60D 8 XTD TIP BNDBL (TIP) ×1
BENZOIN TINCTURE PRP APPL 2/3 (GAUZE/BANDAGES/DRESSINGS) IMPLANT
BLADE CLIPPER SURG (BLADE) IMPLANT
CANISTER SUCT 3000ML PPV (MISCELLANEOUS) ×3 IMPLANT
CLOSURE WOUND 1/2 X4 (GAUZE/BANDAGES/DRESSINGS)
DRAPE LAPAROTOMY 100X72X124 (DRAPES) ×3 IMPLANT
DRAPE POUCH INSTRU U-SHP 10X18 (DRAPES) ×3 IMPLANT
DRSG OPSITE POSTOP 4X10 (GAUZE/BANDAGES/DRESSINGS) ×2 IMPLANT
DRSG OPSITE POSTOP 4X6 (GAUZE/BANDAGES/DRESSINGS) ×2 IMPLANT
DURAPREP 26ML APPLICATOR (WOUND CARE) IMPLANT
DURASEAL APPLICATOR TIP (TIP) ×2 IMPLANT
DURASEAL SPINE SEALANT 3ML (MISCELLANEOUS) ×2 IMPLANT
ELECT REM PT RETURN 9FT ADLT (ELECTROSURGICAL) ×3
ELECTRODE REM PT RTRN 9FT ADLT (ELECTROSURGICAL) ×1 IMPLANT
GAUZE SPONGE 4X4 12PLY STRL (GAUZE/BANDAGES/DRESSINGS) ×3 IMPLANT
GAUZE SPONGE 4X4 16PLY XRAY LF (GAUZE/BANDAGES/DRESSINGS) IMPLANT
GLOVE BIO SURGEON STRL SZ 6.5 (GLOVE) ×1 IMPLANT
GLOVE BIO SURGEON STRL SZ7 (GLOVE) IMPLANT
GLOVE BIO SURGEON STRL SZ7.5 (GLOVE) IMPLANT
GLOVE BIO SURGEON STRL SZ8 (GLOVE) IMPLANT
GLOVE BIO SURGEON STRL SZ8.5 (GLOVE) IMPLANT
GLOVE BIO SURGEONS STRL SZ 6.5 (GLOVE) ×1
GLOVE BIOGEL M 8.0 STRL (GLOVE) ×3 IMPLANT
GLOVE BIOGEL PI IND STRL 6.5 (GLOVE) IMPLANT
GLOVE BIOGEL PI IND STRL 7.5 (GLOVE) IMPLANT
GLOVE BIOGEL PI INDICATOR 6.5 (GLOVE) ×2
GLOVE BIOGEL PI INDICATOR 7.5 (GLOVE) ×2
GLOVE ECLIPSE 6.5 STRL STRAW (GLOVE) IMPLANT
GLOVE ECLIPSE 7.0 STRL STRAW (GLOVE) IMPLANT
GLOVE ECLIPSE 7.5 STRL STRAW (GLOVE) IMPLANT
GLOVE ECLIPSE 8.0 STRL XLNG CF (GLOVE) IMPLANT
GLOVE ECLIPSE 8.5 STRL (GLOVE) IMPLANT
GLOVE EXAM NITRILE LRG STRL (GLOVE) IMPLANT
GLOVE EXAM NITRILE MD LF STRL (GLOVE) IMPLANT
GLOVE EXAM NITRILE XL STR (GLOVE) IMPLANT
GLOVE EXAM NITRILE XS STR PU (GLOVE) IMPLANT
GLOVE INDICATOR 6.5 STRL GRN (GLOVE) IMPLANT
GLOVE INDICATOR 7.0 STRL GRN (GLOVE) IMPLANT
GLOVE INDICATOR 7.5 STRL GRN (GLOVE) IMPLANT
GLOVE INDICATOR 8.0 STRL GRN (GLOVE) IMPLANT
GLOVE INDICATOR 8.5 STRL (GLOVE) IMPLANT
GLOVE OPTIFIT SS 8.0 STRL (GLOVE) IMPLANT
GLOVE SS BIOGEL STRL SZ 7 (GLOVE) IMPLANT
GLOVE SUPERSENSE BIOGEL SZ 7 (GLOVE) ×2
GLOVE SURG SS PI 6.5 STRL IVOR (GLOVE) IMPLANT
GOWN STRL REUS W/ TWL LRG LVL3 (GOWN DISPOSABLE) ×1 IMPLANT
GOWN STRL REUS W/ TWL XL LVL3 (GOWN DISPOSABLE) IMPLANT
GOWN STRL REUS W/TWL 2XL LVL3 (GOWN DISPOSABLE) IMPLANT
GOWN STRL REUS W/TWL LRG LVL3 (GOWN DISPOSABLE) ×9
GOWN STRL REUS W/TWL XL LVL3 (GOWN DISPOSABLE)
KIT BASIN OR (CUSTOM PROCEDURE TRAY) ×3 IMPLANT
KIT ROOM TURNOVER OR (KITS) ×3 IMPLANT
NS IRRIG 1000ML POUR BTL (IV SOLUTION) ×3 IMPLANT
PACK LAMINECTOMY NEURO (CUSTOM PROCEDURE TRAY) ×3 IMPLANT
PAD ARMBOARD 7.5X6 YLW CONV (MISCELLANEOUS) ×9 IMPLANT
SPONGE SURGIFOAM ABS GEL SZ50 (HEMOSTASIS) ×3 IMPLANT
STRIP CLOSURE SKIN 1/2X4 (GAUZE/BANDAGES/DRESSINGS) IMPLANT
SUT ETHILON 3 0 FSL (SUTURE) ×4 IMPLANT
SUT PROLENE 5 0 C1 (SUTURE) ×2 IMPLANT
SUT PROLENE 6 0 BV (SUTURE) ×2 IMPLANT
SUT VIC AB 0 CT1 18XCR BRD8 (SUTURE) ×1 IMPLANT
SUT VIC AB 0 CT1 8-18 (SUTURE) ×6
SUT VIC AB 2-0 CP2 18 (SUTURE) ×5 IMPLANT
SUT VIC AB 3-0 SH 8-18 (SUTURE) ×3 IMPLANT
SWAB COLLECTION DEVICE MRSA (MISCELLANEOUS) ×3 IMPLANT
TOWEL OR 17X24 6PK STRL BLUE (TOWEL DISPOSABLE) ×3 IMPLANT
TOWEL OR 17X26 10 PK STRL BLUE (TOWEL DISPOSABLE) ×3 IMPLANT
TUBE ANAEROBIC SPECIMEN COL (MISCELLANEOUS) ×3 IMPLANT
WATER STERILE IRR 1000ML POUR (IV SOLUTION) ×3 IMPLANT

## 2015-09-27 NOTE — Anesthesia Preprocedure Evaluation (Addendum)
Anesthesia Evaluation  Patient identified by MRN, date of birth, ID band Patient awake    Reviewed: Allergy & Precautions, H&P , Patient's Chart, lab work & pertinent test results, reviewed documented beta blocker date and time   History of Anesthesia Complications (+) PONV and history of anesthetic complications  Airway Mallampati: II  TM Distance: >3 FB Neck ROM: full    Dental  (+) Edentulous Lower, Edentulous Upper   Pulmonary COPD, former smoker,    breath sounds clear to auscultation       Cardiovascular hypertension, Pt. on medications + CAD, + Past MI and + Cardiac Stents   Rhythm:regular Rate:Normal  2006 55% EF on 4/16 echo    Neuro/Psych  Headaches, Anxiety Depression    GI/Hepatic   Endo/Other    Renal/GU      Musculoskeletal   Abdominal   Peds  Hematology   Anesthesia Other Findings CHF [facility] l-ll, doesnt leave the house  Her current mental status is alert, awake but not really oriented to place or circumstance.. Likely post op cognitive decline that should be temporary due to several day hospital course, age, baseline mental status and the fact she is receiving mind altering substances like valium in the hospital  Reproductive/Obstetrics                           Anesthesia Physical  Anesthesia Plan  ASA: III  Anesthesia Plan: General   Post-op Pain Management:    Induction: Intravenous  Airway Management Planned: Oral ETT  Additional Equipment:   Intra-op Plan:   Post-operative Plan: Extubation in OR  Informed Consent: I have reviewed the patients History and Physical, chart, labs and discussed the procedure including the risks, benefits and alternatives for the proposed anesthesia with the patient or authorized representative who has indicated his/her understanding and acceptance.   Dental Advisory Given and Dental advisory given  Plan Discussed with: CRNA and  Surgeon  Anesthesia Plan Comments:         Anesthesia Quick Evaluation

## 2015-09-27 NOTE — Anesthesia Procedure Notes (Signed)
Procedure Name: Intubation Date/Time: 09/27/2015 8:05 PM Performed by: Dang Mathison S Pre-anesthesia Checklist: Patient identified, Timeout performed, Emergency Drugs available, Suction available and Patient being monitored Patient Re-evaluated:Patient Re-evaluated prior to inductionOxygen Delivery Method: Circle system utilized Preoxygenation: Pre-oxygenation with 100% oxygen Intubation Type: IV induction Ventilation: Mask ventilation without difficulty Laryngoscope Size: Mac and 3 Grade View: Grade I Tube type: Oral Tube size: 7.5 mm Number of attempts: 1 Airway Equipment and Method: Stylet Placement Confirmation: ETT inserted through vocal cords under direct vision,  positive ETCO2 and breath sounds checked- equal and bilateral Secured at: 22 cm Tube secured with: Tape Dental Injury: Teeth and Oropharynx as per pre-operative assessment

## 2015-09-27 NOTE — Progress Notes (Signed)
Pharmacy Antibiotic Note  [name] is a 82 y.o. female s/p lumbar fusion 6/10 and insertion of lumbar drain tonight for CSF leak.  Pharmacy has been consulted for Vancomycin dosing.  Vancomycin 1 g IV given pre-op at ~ 8 pm.  Plan: Vancomycin 1 g IV q24h, next dose at 0600 6/13  Height: 5\' 1"  (154.9 cm) Weight: 179 lb 3 oz (81.279 kg) IBW/kg (Calculated) : 47.8  Temp (24hrs), Avg:98 F (36.7 C), Min:97.4 F (36.3 C), Max:98.9 F (37.2 C)  No results for input(s): WBC, CREATININE, LATICACIDVEN, VANCOTROUGH, VANCOPEAK, VANCORANDOM, GENTTROUGH, GENTPEAK, GENTRANDOM, TOBRATROUGH, TOBRAPEAK, TOBRARND, AMIKACINPEAK, AMIKACINTROU, AMIKACIN in the last 168 hours.  Estimated Creatinine Clearance: 39.4 mL/min (by C-G formula based on Cr of 1.1).    Allergies  Allergen Reactions  . Cephalosporins Diarrhea and Nausea Only    Lightheaded  . Levaquin [Levofloxacin In D5w] Swelling  . Macrodantin [Nitrofurantoin Macrocrystal] Swelling  . Phenothiazines Anaphylaxis and Hives  . Polysorbate Anaphylaxis  . Prednisone Shortness Of Breath  . Buspirone Itching  . Cardura [Doxazosin Mesylate] Itching  . Codeine Itching  . Prochlorperazine Other (See Comments)    unknown  . Ranexa [Ranolazine]     Severe drop in BP  . Atorvastatin Hives    Cramping; tolerates Crestor ok  . Ofloxacin Rash  . Other Itching and Rash    "WOOL"= make skin look like it has been burned  . Penicillins Other (See Comments)    Causes redness all over. Has patient had a PCN reaction causing immediate rash, facial/tongue/throat swelling, SOB or lightheadedness with hypotension: No Has patient had a PCN reaction causing severe rash involving mucus membranes or skin necrosis: No Has patient had a PCN reaction that required hospitalization No Has patient had a PCN reaction occurring within the last 10 years: No If all of the above answers are "NO", then may proceed with Cephalosporin use.   . Pimozide Hives and  Itching    Antimicrobials this admission: Vancomycin 6/12 >>   Caryl Pina 09/27/2015 11:49 PM

## 2015-09-27 NOTE — Anesthesia Postprocedure Evaluation (Signed)
Anesthesia Post Note  Patient: HOLLYANN TORELLO  Procedure(s) Performed: Procedure(s) (LRB): Exploration of Lumbar Wound w/ Repair CSF Leak/Lumbar Drain Placement (N/A)  Patient location during evaluation: PACU Anesthesia Type: General Level of consciousness: awake and alert Pain management: pain level controlled Vital Signs Assessment: post-procedure vital signs reviewed and stable Respiratory status: spontaneous breathing, nonlabored ventilation, respiratory function stable and patient connected to nasal cannula oxygen Cardiovascular status: blood pressure returned to baseline and stable Postop Assessment: no signs of nausea or vomiting Anesthetic complications: no    Last Vitals:  Filed Vitals:   09/27/15 1719 09/27/15 2140  BP: 138/44 172/73  Pulse: 66 73  Temp: 36.7 C 36.7 C  Resp: 19 19    Last Pain:  Filed Vitals:   09/27/15 2141  PainSc: Asleep                 Zenaida Deed

## 2015-09-27 NOTE — Progress Notes (Signed)
Patient ID: [name], female   DOB: [birth year], 82 y.o.   MRN: [id number]

## 2015-09-27 NOTE — Op Note (Signed)
[name], RATZLOFF NO.:  [id number]  MEDICAL RECORD NO.:  [id number]  LOCATION:  3M10C                        FACILITY:  [facility]  PHYSICIAN:  Leeroy Cha, M.D.   DATE OF BIRTH:  07-13-1932  DATE OF PROCEDURE:  09/27/2015 DATE OF DISCHARGE:                              OPERATIVE REPORT   PREOPERATIVE DIAGNOSIS:  Status post lumbar fusion.  Cerebrospinal fluid leak.  POSTOPERATIVE DIAGNOSIS:  Status post lumbar fusion.  Cerebrospinal fluid leak.  PROCEDURES:  Exploration of the lumbar wound.  Suture over calcified dura mater opening in the midline.  Insertion of lumbar catheter for drain.  Microscope.  SURGEON:  Leeroy Cha, M.D.  ASSISTANT:  Dr. Cyndy Freeze.  CLINICAL HISTORY:  [name] over 72 hours underwent surgery.  During surgery, we found quite a bit of calcification of the dura mater.  The wound by that time we closed was dry.  Nevertheless, because the calcification of dura mater and dissection, we decided to keep her flat in bed.  Nevertheless, yesterday afternoon when I saw her, she was completely dry.  This morning, when I went to see her again in my rounds, I found that she was leaking a spinal fluid.  Because of the calcification of the dura mater, I decided to take her back to Surgery because I did not expect that the dura will heal by itself without bed rest.  Two daughters were fully aware.  DESCRIPTION OF PROCEDURE:  The patient was taken to the OR and after intubation, she was positioned in a prone manner.  The staples were removed and the drapes were applied.  Midline incision following the previous one was made.  We did not find any fluid in the subcu space, but once we opened the fascia, there was fluid in the operative site. The area was irrigated.  I investigated with the help of the microscope and I found a flap, calcified.  Valsalva maneuver was the only way to find out the opening in the midline.  Then, because the  calcification of the dura mater, I used 5-0 Prolene and with two single stitches, I was able to close the dura mater.  A piece of fat was left on top as well as surgical glue.  Then, using lumbar drain needle, I was able to go one level above from the surgery.  The needle was inserted through the interspinous ligament down to the lumbar canal into the arachnoid space. The catheter was inserted and brought to the skin laterally through different incision.  The CSF was clear and draining.  Then, we went back again to the main area, we did Valsalva 3 times, that was negative.  Then, the wound was closed with different layer of Vicryl and the skin with nylon.  The patient is going to go to the PACU and later on to the intensive care unit.  We are going to keep her flat in bed for at least for the next 72 hours.          ______________________________ Leeroy Cha, M.D.     EB/MEDQ  D:  09/27/2015  T:  09/27/2015  Job:  [id number]

## 2015-09-27 NOTE — Care Management Important Message (Signed)
Important Message  Patient Details  Name: [name] MRN: [id number] Date of Birth: 07-11-1932   Medicare Important Message Given:  Yes    Loann Quill 09/27/2015, 1:54 PM

## 2015-09-27 NOTE — Progress Notes (Signed)
Per Dr Joya Salm okay to leave lumbar drain capped while in PACU unless patient remains in PACU for over 3 hours. Goal is for output to be 5-44ml/hour from drain.

## 2015-09-27 NOTE — Progress Notes (Signed)
Patient ID: [name], female   DOB: February 10, 1933, 82 y.o.   MRN: [id number] Patient with quite a bit of csf leak. No headache. Wound dry. Plan  To take her to the or for revision and insertion of a lumbar drain. Spoke with family

## 2015-09-27 NOTE — Transfer of Care (Signed)
Immediate Anesthesia Transfer of Care Note  Patient: [name]  Procedure(s) Performed: Procedure(s): Exploration of Lumbar Wound w/ Repair CSF Leak/Lumbar Drain Placement (N/A)  Patient Location: PACU  Anesthesia Type:General  Level of Consciousness: awake  Airway & Oxygen Therapy: Patient Spontanous Breathing and Patient connected to face mask oxygen  Post-op Assessment: Report given to RN and Post -op Vital signs reviewed and stable  Post vital signs: Reviewed and stable  Last Vitals:  Filed Vitals:   09/27/15 1719 09/27/15 2140  BP: 138/44 172/73  Pulse: 66 73  Temp: 36.7 C 36.7 C  Resp: 19 19    Last Pain:  Filed Vitals:   09/27/15 2141  PainSc: Asleep      Patients Stated Pain Goal: 4 ([id number])  Complications: No apparent anesthesia complications

## 2015-09-28 ENCOUNTER — Encounter (HOSPITAL_COMMUNITY): Payer: Self-pay | Admitting: Neurosurgery

## 2015-09-28 NOTE — Progress Notes (Signed)
Patient ID: [name], female   DOB: 05-Nov-1932, 82 y.o.   MRN: [id number] Doing well. No weakness. Drain working well.

## 2015-09-29 ENCOUNTER — Encounter (HOSPITAL_COMMUNITY): Payer: Self-pay | Admitting: *Deleted

## 2015-09-29 LAB — CSF CELL COUNT WITH DIFFERENTIAL
EOS CSF: 1 % (ref 0–1)
Lymphs, CSF: 81 % — ABNORMAL HIGH (ref 40–80)
MONOCYTE-MACROPHAGE-SPINAL FLUID: 17 % (ref 15–45)
RBC Count, CSF: 21525 /mm3 — ABNORMAL HIGH
Segmented Neutrophils-CSF: 1 % (ref 0–6)
WBC CSF: 725 /mm3 — AB (ref 0–5)

## 2015-09-29 LAB — CREATININE, SERUM
CREATININE: 1.02 mg/dL — AB (ref 0.44–1.00)
GFR calc non Af Amer: 51 mL/min — ABNORMAL LOW (ref >60)
GFR, EST AFRICAN AMERICAN: 59 mL/min — AB (ref >60)

## 2015-09-29 NOTE — Progress Notes (Signed)
Patient ID: [name], female   DOB: 1932/11/16, 82 y.o.   MRN: [id number] Better. Some csf BUT in the drain site. Changed the dressing. Wound dry. Csf to lab gram,culture, cbc

## 2015-09-29 NOTE — Progress Notes (Signed)
When patient repositioned at [place] noted opsite over lumbar drain was saturated and leaking onto bed pad. Lumbar drain still pulsating and putting out 5-10cc/hr.  Reinforced dressing and notified Dr Annette Stable.  Ordered to  reinforce dressing and continue to monitor.  [name], [name] 2:06 AM

## 2015-09-29 NOTE — Progress Notes (Signed)
CRITICAL VALUE ALERT  Critical value received:  WBC, CSF  725  Date of notification:  09/29/15  Time of notification:  2027  Critical value read back:Yes.    Nurse who received alert:  [place] Erynn Vaca, RN  MD notified (1st page):  Dr Ellene Route  Time of first page:  2030  MD notified (2nd page):  Time of second page:  Responding MD:  Ellene Route  Time MD responded:  2100- No new interventions right now. Patient already on vancomycin.

## 2015-09-30 DIAGNOSIS — M4316 Spondylolisthesis, lumbar region: Secondary | ICD-10-CM

## 2015-09-30 DIAGNOSIS — Z955 Presence of coronary angioplasty implant and graft: Secondary | ICD-10-CM

## 2015-09-30 DIAGNOSIS — Z7982 Long term (current) use of aspirin: Secondary | ICD-10-CM

## 2015-09-30 DIAGNOSIS — G960 Cerebrospinal fluid leak: Secondary | ICD-10-CM

## 2015-09-30 DIAGNOSIS — Z9889 Other specified postprocedural states: Secondary | ICD-10-CM

## 2015-09-30 DIAGNOSIS — I2510 Atherosclerotic heart disease of native coronary artery without angina pectoris: Secondary | ICD-10-CM

## 2015-09-30 LAB — CBC WITH DIFFERENTIAL/PLATELET
BASOS ABS: 0 10*3/uL (ref 0.0–0.1)
Basophils Relative: 1 %
EOS ABS: 0.2 10*3/uL (ref 0.0–0.7)
EOS PCT: 6 %
HCT: 21.4 % — ABNORMAL LOW (ref 36.0–46.0)
Hemoglobin: 7.1 g/dL — ABNORMAL LOW (ref 12.0–15.0)
Lymphocytes Relative: 30 %
Lymphs Abs: 1.2 10*3/uL (ref 0.7–4.0)
MCH: 29.5 pg (ref 26.0–34.0)
MCHC: 33.2 g/dL (ref 30.0–36.0)
MCV: 88.8 fL (ref 78.0–100.0)
Monocytes Absolute: 0.4 10*3/uL (ref 0.1–1.0)
Monocytes Relative: 11 %
Neutro Abs: 2 10*3/uL (ref 1.7–7.7)
Neutrophils Relative %: 52 %
PLATELETS: 212 10*3/uL (ref 150–400)
RBC: 2.41 MIL/uL — AB (ref 3.87–5.11)
RDW: 14 % (ref 11.5–15.5)
WBC: 3.9 10*3/uL — AB (ref 4.0–10.5)

## 2015-09-30 LAB — PATHOLOGIST SMEAR REVIEW

## 2015-09-30 MED ORDER — BOOST / RESOURCE BREEZE PO LIQD
1.0000 | Freq: Three times a day (TID) | ORAL | Status: DC
  Administered 2015-09-30 – 2015-10-08 (×14): 1 via ORAL
  Filled 2015-09-30 (×13): qty 1

## 2015-09-30 MED ORDER — DEXTROSE 5 % IV SOLN
2.0000 g | Freq: Two times a day (BID) | INTRAVENOUS | Status: DC
  Administered 2015-09-30 – 2015-10-04 (×9): 2 g via INTRAVENOUS
  Filled 2015-09-30 (×10): qty 2

## 2015-09-30 NOTE — Consult Note (Signed)
[name] for Infectious Disease     Reason for Consult: Pleocytosis     Referring Physician: Dr. Joya Salm  Active Problems:   Spondylolisthesis of lumbar region   CSF leak   . aspirin EC  81 mg Oral q morning - 10a  . carvedilol  12.5 mg Oral BID  . DULoxetine  60 mg Oral QHS  . furosemide  20 mg Oral Daily  . heparin subcutaneous  5,000 Units Subcutaneous Q8H  . senna  1 tablet Oral BID  . sodium chloride flush  3 mL Intravenous Q12H  . vancomycin  1,000 mg Intravenous Q24H   Assessment/Recommendations: [name] is an 81 yo female with confusion and persistent CSF leak after lumbar facetectomy and decompression on 6/9 and found to have WBC in CSF.   Pleocytosis: Patient has been afebrile, but now with altered mental status. CSF studies showed orange, turbid fluid, RBC 21525, WBC 725, Lymphs 81%, Neuts 1%. WBC on gram stain, but no organisms. The adjusted WBC count based on CBC from 6/5 continues to be elevated at 724. There was a question if patient had altered mental status yesterday in the setting of headache, but both have resolved. AMS may have been secondary to medication effect. Currently, patient denies fever, chills, headache, nausea, vomiting or neck stiffness. Clinically, it does not appear that patient has meningitis. If we were to treat empirically for bacterial meningitis, recommendations post-neurosurgery are for treatment with Vancomycin and Cefepime for coverage of aerobic gram-negative bacilli (P. aeruginosa), S. aureus, and coagulase-negative staphylococci (especially S. epidermidis).  -Add peripheral CBC with differential -Add on CSF glucose and protein -Treat empirically for post-neurosurgical bacterial meningitis with Vancomycin and Cefepime  Spondylolithesis s/p Lumbar Facetectomy and Decompression: Patient states pain is controlled. -Per Neurosurgery  CAD s/p PCI to LCx: Patient denies any chest pain or shortness of breath.  -ASA 81 mg  daily -Coreg BID  Antibiotics: Vancomycin 6/12>>  Culture: CSF Culture 6/14: Orange, turbid, RBC 21525, WBC 725, Lymphs 81%, Neuts 1%. WBC on gram stain, but no organisms.   HPI: [name] is a 82 y.o. female with PMHx of CAD s/p PCI to LCx, chronic HFpEF, GERD, HLD and spondylolithesis at L3-L4 and L4-L5 who underwent bilateral facetectomy at L3 and L4 and decompression with pedicle screw placement at L3-L5 on 09/24/15. CSF leak was noted on 6/12 and patient was taken back to the OR for exploration of the lumbar wound and insertion of a lumbar catheter drain. Vancomycin was started on 6/12 given new drain. CSF was sent for cell count, differential, gram stain and culture which as shown WBC of 725 and WBC on gram stain, but no organisms.    Review of Systems: General: Denies fever, chills, fatigue, and diaphoresis.  Respiratory: Denies SOB, cough.   Cardiovascular: Denies chest pain and palpitations.  Gastrointestinal: Denies nausea, vomiting, abdominal pain, diarrhea.  Genitourinary: Denies dysuria, urgency Endocrine: Denies hot or cold intolerance. Musculoskeletal: Denies neck stiffness or myalgias.  Neurological: Denies dizziness, headaches, weakness, lightheadedness, numbness, seizures Psychiatric/Behavioral: Denies mood changes, confusion.  Past Medical History  Diagnosis Date  . PVD (peripheral vascular disease) ([facility])   . Other and unspecified hyperlipidemia   . Panic disorder without agoraphobia   . HTN (hypertension)     Hx of it  . MI (myocardial infarction) ([facility]) 2006  . Internal hemorrhoids without mention of complication   . Dysphagia, unspecified(787.20)   . Microscopic colitis 2003  . GERD (  gastroesophageal reflux disease)     Hx Schatzki's ring, multiple EGD/ED last 01/06/2004  . Hyperlipemia   . Thyroid disease     recent abnl TSH per pt  . S/P colonoscopy 09/27/2001    internal hemorrhoids, desc colon inflam polyp, SB BX-chronic duodenitis, colitis  .  Arthritis   . Shortness of breath   . Heart disease   . Bursitis     left shoulder  . Hyperlipidemia   . Sleep disorder     obstructive  . COPD (chronic obstructive pulmonary disease) ([place])   . Complication of anesthesia   . PONV (postoperative nausea and vomiting)     'a little nausea"  . Anxiety   . Depression   . Pneumonia 12/2011  . Heart murmur     'a littel'  . Cataract   . Paresthesia     hands, feet  . CHF (congestive heart failure) ([place])   . Headache   . Coronary atherosclerosis of native coronary artery     DES to CX, moderately severe stenosis RCA, mild stenosis LAD 04/2013    Social History  Substance Use Topics  . Smoking status: Former Smoker -- 1.00 packs/day for 64 years    Types: Cigarettes    Start date: 12/24/1947    Quit date: 11/17/2001  . Smokeless tobacco: Never Used     Comment: Quit smoking in 2003  . Alcohol Use: No    Family History  Problem Relation Age of Onset  . Coronary artery disease Other     family Hx-sons  . Cancer Other   . Stroke Other     family Hx  . Hypertension Other     family Hx  . Diabetes Brother   . Heart disease Son     before age 81  . Diabetes Son   . Stroke Daughter 24  . Stroke Mother     Allergies  Allergen Reactions  . Cephalosporins Diarrhea and Nausea Only    Lightheaded  . Levaquin [Levofloxacin In D5w] Swelling  . Macrodantin [Nitrofurantoin Macrocrystal] Swelling  . Phenothiazines Anaphylaxis and Hives  . Polysorbate Anaphylaxis  . Prednisone Shortness Of Breath  . Buspirone Itching  . Cardura [Doxazosin Mesylate] Itching  . Codeine Itching  . Prochlorperazine Other (See Comments)    unknown  . Ranexa [Ranolazine]     Severe drop in BP  . Atorvastatin Hives    Cramping; tolerates Crestor ok  . Ofloxacin Rash  . Other Itching and Rash    "WOOL"= make skin look like it has been burned  . Penicillins Other (See Comments)    Causes redness all over. Has patient had a PCN reaction causing  immediate rash, facial/tongue/throat swelling, SOB or lightheadedness with hypotension: No Has patient had a PCN reaction causing severe rash involving mucus membranes or skin necrosis: No Has patient had a PCN reaction that required hospitalization No Has patient had a PCN reaction occurring within the last 10 years: No If all of the above answers are "NO", then may proceed with Cephalosporin use.   . Pimozide Hives and Itching   Physical Exam: Filed Vitals:   09/30/15 0900 09/30/15 1000 09/30/15 1100 09/30/15 1200  BP: 148/53 152/66 161/53   Pulse: 75 71 70   Temp:    98 F (36.7 C)  TempSrc:    Oral  Resp: 19 16 16    Height:      Weight:      SpO2: 95% 100% 99%  General: Vital signs reviewed.  Patient is elderly, in no acute distress and cooperative with exam.  Head: Normocephalic and atraumatic. Eyes: Conjunctivae normal, no scleral icterus.  Neck: Supple, trachea midline.  Cardiovascular: RRR, S1 normal, S2 normal Pulmonary/Chest: Clear to auscultation bilaterally, no wheezes, rales, or rhonchi. Abdominal: Soft, non-tender, non-distended, BS + Extremities: No lower extremity edema bilaterally. Neurological: A&O x3, answers questions appropriately Skin: Warm, dry and intact. No rashes or erythema. Psychiatric: Normal mood and affect. speech and behavior is normal. Cognition and memory are grossly normal.    Lab Results  Component Value Date   WBC 7.2 09/20/2015   HGB 12.8 09/20/2015   HCT 37.9 09/20/2015   MCV 89.6 09/20/2015   PLT 240 09/20/2015    Lab Results  Component Value Date   CREATININE 1.02* 09/29/2015   BUN 17 09/20/2015   NA 139 09/20/2015   K 3.9 09/20/2015   CL 105 09/20/2015   CO2 28 09/20/2015    Lab Results  Component Value Date   ALT 14 02/21/2015   AST 19 02/21/2015   ALKPHOS 57 02/21/2015     Microbiology: Recent Results (from the past 240 hour(s))  CSF culture with Stat gram stain     Status: None (Preliminary result)    Collection Time: 09/29/15  5:00 PM  Result Value Ref Range Status   Specimen Description CSF  Final   Special Requests COLLECTED FROM LUMBAR DRAIN  Final   Gram Stain   Final    WBC PRESENT,BOTH PMN AND MONONUCLEAR NO ORGANISMS SEEN CYTOSPIN    Culture PENDING  Incomplete   Report Status PENDING  Incomplete   Martyn Malay, DO PGY-2 Internal Medicine Resident Pager # [phone] 09/30/2015 2:02 PM

## 2015-09-30 NOTE — Progress Notes (Signed)
Initial Nutrition Assessment  DOCUMENTATION CODES:   Not applicable  INTERVENTION:  -Boost Breeze po TID, each supplement provides 250 kcal and 9 grams of protein -Encourage PO intake -RD to continue to monitor  NUTRITION DIAGNOSIS:   Inadequate oral intake related to poor appetite as evidenced by per patient/family report.  GOAL:   Patient will meet greater than or equal to 90% of their needs  MONITOR:   PO intake, Supplement acceptance, I & O's, Labs, Weight trends  REASON FOR ASSESSMENT:   Consult Assessment of nutrition requirement/status  ASSESSMENT:   patient seen initially in 2014 with lumbar pain with radiation to both legs with walking and burning sensation. She had decompression but lately the pain came back and radiological studies showed spondylolisthesis at l4-5 andl3-4. No better with conservative treatment.   Spoke with Ms. Mauss at bedside. She endorses poor appetite "I'm not hungry and don't feel very good." This has been going on since she had back surgery. She does not exhibit any weight loss at this time.  Endorses good appetite PTA "I was eating too much." States she would eat burgers, potato salad, wendy's basically anything she wanted. Current consumption of trays is limited. She had jello for lunch but did not eat much else. She was agreeable to try boost breeze. Overall, she did not have a whole lot to say, was pretty tired during my visit.  Nutrition-Focused physical exam completed. Findings are mild-moderate fat depletion, moderate-severe muscle depletion, and no edema.   Labs and Medications reviewed: Senokot PO  Diet Order:  Diet clear liquid Room service appropriate?: Yes; Fluid consistency:: Thin  Skin:  Wound (see comment) (Incsions to back)  Last BM:  6/8  Height:   Ht Readings from Last 1 Encounters:  09/28/15 5\' 1"  (1.549 m)    Weight:   Wt Readings from Last 1 Encounters:  09/28/15 182 lb 1.6 oz (82.6 kg)    Ideal Body  Weight:  47.72 kg  BMI:  Body mass index is 34.43 kg/(m^2).  Estimated Nutritional Needs:   Kcal:  1400-1700  Protein:  50-60  Fluid:  >/= 1.4 L  EDUCATION NEEDS:   No education needs identified at this time  Satira Anis. Alexandro Line, MS, RD LDN Inpatient Clinical Dietitian Pager [phone]

## 2015-09-30 NOTE — Progress Notes (Signed)
Pt has increased urinary output.  Specific gravity 1.005 @ 1600.  Notified Dr. Cyndy Freeze of this.  MD states OK due to her drinking and getting IV fluids.  Orders to check Na in am.  Will continue to monitor pt.

## 2015-09-30 NOTE — Progress Notes (Signed)
Patient ID: [name], female   DOB: 12-10-1932, 82 y.o.   MRN: [id number] Wound dry. Confused. Csf with increse of wbc. ID to see her

## 2015-09-30 NOTE — Care Management Note (Signed)
Case Management Note  Patient Details  Name: [name] MRN: [id number] Date of Birth: 02/20/1933  Subjective/Objective:  Pt admitted on 09/24/15 s/p L3-4-5 PLIF with postoperative CSF leak.  PTA, pt fairly independent, lives with daughter.                    Action/Plan: Met with pt and her two daughters at bedside.  Pt remains on bedrest; PT/OT evals pending at this time.  Daughters concerned that pt may need rehab post-dc.  Explained process of having PT/OT evaluate to determine LOC needed upon dc.  Will continue to follow;  Will need PT/OT evals when drain removed and bedrest discontinued.    Expected Discharge Date:                  Expected Discharge Plan:  [place]  In-House Referral:     Discharge planning Services  CM Consult  Post Acute Care Choice:    Choice offered to:     DME Arranged:    DME Agency:     HH Arranged:    [place] Agency:     Status of Service:  In process, will continue to follow  Medicare Important Message Given:  Yes Date Medicare IM Given:    Medicare IM give by:    Date Additional Medicare IM Given:    Additional Medicare Important Message give by:     If discussed at [facility] of Stay Meetings, dates discussed:    Additional Comments:  Reinaldo Raddle, RN, BSN  Trauma/Neuro ICU Case Manager [phone]

## 2015-09-30 NOTE — Care Management Important Message (Signed)
Important Message  Patient Details  Name: [name] MRN: [id number] Date of Birth: 1932-06-20   Medicare Important Message Given:  Yes    Labrina Lines Abena 09/30/2015, 11:14 AM

## 2015-09-30 NOTE — Progress Notes (Addendum)
Pharmacy Antibiotic Note  [name] is a 82 y.o. female s/p lumbar fusion 6/10 and insertion of lumbar drain tonight for CSF leak.  Pending CSF cx from drain. Now adding on cefepime  Plan: Vancomycin 1 g IV q24h VT in am Cefepime 2 g q12h (high dose for possible meningitis)  Height: 5\' 1"  (154.9 cm) Weight: 182 lb 1.6 oz (82.6 kg) IBW/kg (Calculated) : 47.8  Temp (24hrs), Avg:98.2 F (36.8 C), Min:98 F (36.7 C), Max:98.4 F (36.9 C)   Recent Labs Lab 09/29/15 0431  CREATININE 1.02*    Estimated Creatinine Clearance: 42.8 mL/min (by C-G formula based on Cr of 1.02).    Allergies  Allergen Reactions  . Cephalosporins Diarrhea and Nausea Only    Lightheaded  . Levaquin [Levofloxacin In D5w] Swelling  . Macrodantin [Nitrofurantoin Macrocrystal] Swelling  . Phenothiazines Anaphylaxis and Hives  . Polysorbate Anaphylaxis  . Prednisone Shortness Of Breath  . Buspirone Itching  . Cardura [Doxazosin Mesylate] Itching  . Codeine Itching  . Prochlorperazine Other (See Comments)    unknown  . Ranexa [Ranolazine]     Severe drop in BP  . Atorvastatin Hives    Cramping; tolerates Crestor ok  . Ofloxacin Rash  . Other Itching and Rash    "WOOL"= make skin look like it has been burned  . Penicillins Other (See Comments)    Causes redness all over. Has patient had a PCN reaction causing immediate rash, facial/tongue/throat swelling, SOB or lightheadedness with hypotension: No Has patient had a PCN reaction causing severe rash involving mucus membranes or skin necrosis: No Has patient had a PCN reaction that required hospitalization No Has patient had a PCN reaction occurring within the last 10 years: No If all of the above answers are "NO", then may proceed with Cephalosporin use.   . Pimozide Hives and Itching    Antimicrobials this admission: Vancomycin 6/12 >>  Cefepime 6/15>>  Levester Fresh, PharmD, BCPS, [facility] Clinical Pharmacist Pager [phone] 09/30/2015  9:37 AM

## 2015-09-30 NOTE — Progress Notes (Signed)
Patient ID: [name], female   DOB: 1932-12-19, 81 y.o.   MRN: [id number] Seen by ID   Family aware i will be out of town. Dr Diidy and my partners to help with her care. Poss oob on Saturday. wound has been dry

## 2015-10-01 ENCOUNTER — Encounter (HOSPITAL_COMMUNITY): Payer: Self-pay | Admitting: Cardiology

## 2015-10-01 DIAGNOSIS — G039 Meningitis, unspecified: Secondary | ICD-10-CM | POA: Diagnosis not present

## 2015-10-01 DIAGNOSIS — I472 Ventricular tachycardia: Secondary | ICD-10-CM

## 2015-10-01 DIAGNOSIS — Z982 Presence of cerebrospinal fluid drainage device: Secondary | ICD-10-CM

## 2015-10-01 DIAGNOSIS — I2583 Coronary atherosclerosis due to lipid rich plaque: Secondary | ICD-10-CM

## 2015-10-01 DIAGNOSIS — I209 Angina pectoris, unspecified: Secondary | ICD-10-CM | POA: Insufficient documentation

## 2015-10-01 DIAGNOSIS — G008 Other bacterial meningitis: Secondary | ICD-10-CM

## 2015-10-01 DIAGNOSIS — B9689 Other specified bacterial agents as the cause of diseases classified elsewhere: Secondary | ICD-10-CM

## 2015-10-01 DIAGNOSIS — I4729 Other ventricular tachycardia: Secondary | ICD-10-CM | POA: Insufficient documentation

## 2015-10-01 DIAGNOSIS — I2510 Atherosclerotic heart disease of native coronary artery without angina pectoris: Secondary | ICD-10-CM | POA: Insufficient documentation

## 2015-10-01 DIAGNOSIS — I480 Paroxysmal atrial fibrillation: Secondary | ICD-10-CM

## 2015-10-01 DIAGNOSIS — E876 Hypokalemia: Secondary | ICD-10-CM

## 2015-10-01 LAB — SODIUM: SODIUM: 138 mmol/L (ref 135–145)

## 2015-10-01 LAB — BASIC METABOLIC PANEL
Anion gap: 10 (ref 5–15)
Anion gap: 7 (ref 5–15)
BUN: 6 mg/dL (ref 6–20)
BUN: 5 mg/dL — AB (ref 6–20)
CHLORIDE: 93 mmol/L — AB (ref 101–111)
CHLORIDE: 94 mmol/L — AB (ref 101–111)
CO2: 34 mmol/L — AB (ref 22–32)
CO2: 37 mmol/L — ABNORMAL HIGH (ref 22–32)
CREATININE: 0.84 mg/dL (ref 0.44–1.00)
Calcium: 8.1 mg/dL — ABNORMAL LOW (ref 8.9–10.3)
Calcium: 8.5 mg/dL — ABNORMAL LOW (ref 8.9–10.3)
Creatinine, Ser: 0.96 mg/dL (ref 0.44–1.00)
GFR calc Af Amer: >60 mL/min (ref >60)
GFR calc Af Amer: >60 mL/min (ref >60)
GFR calc non Af Amer: >60 mL/min (ref >60)
GFR calc non Af Amer: 54 mL/min — ABNORMAL LOW (ref >60)
GLUCOSE: 105 mg/dL — AB (ref 65–99)
Glucose, Bld: 163 mg/dL — ABNORMAL HIGH (ref 65–99)
POTASSIUM: 2.7 mmol/L — AB (ref 3.5–5.1)
POTASSIUM: 3.3 mmol/L — AB (ref 3.5–5.1)
SODIUM: 138 mmol/L (ref 135–145)
Sodium: 137 mmol/L (ref 135–145)

## 2015-10-01 LAB — CBC WITH DIFFERENTIAL/PLATELET
BASOS ABS: 0.1 10*3/uL (ref 0.0–0.1)
BASOS PCT: 1 %
EOS ABS: 0.4 10*3/uL (ref 0.0–0.7)
EOS PCT: 6 %
HCT: 26.3 % — ABNORMAL LOW (ref 36.0–46.0)
Hemoglobin: 8.7 g/dL — ABNORMAL LOW (ref 12.0–15.0)
Lymphocytes Relative: 22 %
Lymphs Abs: 1.3 10*3/uL (ref 0.7–4.0)
MCH: 29.9 pg (ref 26.0–34.0)
MCHC: 33.1 g/dL (ref 30.0–36.0)
MCV: 90.4 fL (ref 78.0–100.0)
Monocytes Absolute: 0.6 10*3/uL (ref 0.1–1.0)
Monocytes Relative: 10 %
NEUTROS PCT: 61 %
Neutro Abs: 3.7 10*3/uL (ref 1.7–7.7)
Platelets: 270 10*3/uL (ref 150–400)
RBC: 2.91 MIL/uL — AB (ref 3.87–5.11)
RDW: 14.3 % (ref 11.5–15.5)
WBC: 6.1 10*3/uL (ref 4.0–10.5)

## 2015-10-01 LAB — VANCOMYCIN, TROUGH: Vancomycin Tr: 12 ug/mL (ref 10.0–20.0)

## 2015-10-01 LAB — MAGNESIUM: MAGNESIUM: 1.6 mg/dL — AB (ref 1.7–2.4)

## 2015-10-01 LAB — TROPONIN I: Troponin I: <0.03 ng/mL (ref <0.031)

## 2015-10-01 MED ORDER — MAGNESIUM SULFATE 2 GM/50ML IV SOLN
2.0000 g | Freq: Once | INTRAVENOUS | Status: AC
  Administered 2015-10-01: 2 g via INTRAVENOUS
  Filled 2015-10-01: qty 50

## 2015-10-01 MED ORDER — POTASSIUM CHLORIDE CRYS ER 20 MEQ PO TBCR
40.0000 meq | EXTENDED_RELEASE_TABLET | ORAL | Status: AC
  Administered 2015-10-01 (×2): 40 meq via ORAL
  Filled 2015-10-01 (×2): qty 2

## 2015-10-01 MED ORDER — POTASSIUM CHLORIDE CRYS ER 20 MEQ PO TBCR
40.0000 meq | EXTENDED_RELEASE_TABLET | Freq: Once | ORAL | Status: AC
  Administered 2015-10-01: 40 meq via ORAL
  Filled 2015-10-01: qty 2

## 2015-10-01 MED ORDER — CARVEDILOL 12.5 MG PO TABS
25.0000 mg | ORAL_TABLET | Freq: Two times a day (BID) | ORAL | Status: DC
  Administered 2015-10-01 – 2015-10-07 (×13): 25 mg via ORAL
  Filled 2015-10-01 (×15): qty 2

## 2015-10-01 MED ORDER — VANCOMYCIN HCL IN DEXTROSE 750-5 MG/150ML-% IV SOLN
750.0000 mg | Freq: Two times a day (BID) | INTRAVENOUS | Status: DC
  Administered 2015-10-01 – 2015-10-04 (×6): 750 mg via INTRAVENOUS
  Filled 2015-10-01 (×7): qty 150

## 2015-10-01 NOTE — Progress Notes (Signed)
[facility] Infectious Disease    Date of Admission:  09/24/2015      ID: BRIEANN PERINA is a 82 y.o. 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  Active Problems:   Spondylolisthesis of lumbar region   CSF leak   Meningitis  Subjective: DEKISHA ZEEB is a 82 y.o. female with PMHx of CAD s/p PCI to LCx, chronic HFpEF, GERD, HLD and spondylolithesis at L3-L4 and L4-L5 who underwent bilateral facetectomy at L3 and L4 and decompression with pedicle screw placement at L3-L5 on 09/24/15. CSF leak was noted on 6/12 and patient was taken back to the OR for exploration of the lumbar wound and insertion of a lumbar catheter drain. Vancomycin was started on 6/12 given new drain. CSF was sent for cell count, differential, gram stain and culture on 6/14 which as shown WBC of 725 and WBC on gram stain, but no organisms. 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   Patient was seen and examined this morning. She had an episode of chest pain this morning which has resolved. She denies any headache, nausea, vomiting or neck stiffness.     Medications:  . aspirin EC  81 mg Oral q morning - 10a  . carvedilol  25 mg Oral BID  . ceFEPime (MAXIPIME) IV  2 g Intravenous Q12H  . DULoxetine  60 mg Oral QHS  . feeding supplement  1 Container Oral TID BM  . furosemide  20 mg Oral Daily  . heparin subcutaneous  5,000 Units Subcutaneous Q8H  . magnesium sulfate 1 - 4 g bolus IVPB  2 g Intravenous Once  . potassium chloride  40 mEq Oral Q2H  . potassium chloride  40 mEq Oral Once  . senna  1 tablet Oral BID  . sodium chloride flush  3 mL Intravenous Q12H  . vancomycin  750 mg Intravenous Q12H    Objective: Filed Vitals:   10/01/15 0850 10/01/15 0915 10/01/15 0930 10/01/15 1000  BP: 138/52 147/54 150/53 138/47  Pulse: 72 72 71 72  Temp:      TempSrc:      Resp: 18 18 15 10   Height:      Weight:      SpO2: 98% 94%  99% 99%   General: Vital signs reviewed.  Patient is elderly, in no acute distress and cooperative with exam.  Cardiovascular: RRR, S1 normal, S2 normal Pulmonary/Chest: Clear to auscultation bilaterally, no wheezes, rales, or rhonchi. Abdominal: Soft, non-tender, non-distended, BS + Back: Lumbar drain in place Extremities: No lower extremity edema bilaterally, pulses symmetric and intact bilaterally. Neurological: A&O x2, slow speech Skin: Warm, dry and intact. No rashes or erythema.  Lab Results  Recent Labs  09/29/15 0431 09/30/15 1530 10/01/15 0427 10/01/15 0855  WBC  --  3.9*  --   --   HGB  --  7.1*  --   --   HCT  --  21.4*  --   --   NA  --   --  138 137  K  --   --   --  2.7*  CL  --   --   --  93*  CO2  --   --   --  34*  BUN  --   --   --  6  CREATININE 1.02*  --   --  0.84   Antibiotics: Vancomycin 6/12>> Cefepime 6/15>>  Culture: CSF Culture 6/14: Orange, turbid,  RBC 21525, WBC 725, Lymphs 81%, Neuts 1%. WBC on gram stain, but no organisms.   Assessment/Plan: Ms. Gash is an 82 yo female with confusion and persistent CSF leak after lumbar facetectomy and decompression on 6/9 and found to have WBC in CSF.   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 WBC on gram stain, but no organisms. Clinically patient continues to do well. We are treating empirically for post-neurosurgical bacterial meningitis.  -Vancomycin and Cefepime for 2 weeks (6/15>> 6/28)  Spondylolithesis s/p Lumbar Facetectomy and Decompression: Patient states pain is controlled. Drain in place. -Per Neurosurgery  CAD s/p PCI to LCx: Patient had an episode of chest pain this morning- mid-sternal, sharp, shooting 5/10. She was given oxygen, morphine, and nitroglycerin. EKG was obtained which showed NSR with non-specific Patient also had a run of VTach  on telemetry. Potassium has not been check recently. TWI in lead III which is similar to prior EKG from March 2017.  -Troponins trending -BMET pending -ASA 81 mg daily -Coreg BID -Cardiology has been consulted by neurosurgery  Hypokalemia: Potassium 2.7 this morning. Mag 1.6.  -Replaced with Kdur 40 x 3 doses today -Will replace magnesium -Repeat BMET today at 1800 -Repeat BMET tomorrow am  Normocytic anemia: Hgb 7.1 yesterday. Baseline appears to be 12-13. This may be partly dilutional. We will plan to repeat CBC and trend the next few days.  -Type and Screen -Recommend transfusion if <7 -Repeat CBC  -Daily CBC  Martyn Malay, DO PGY-2 Internal Medicine Resident Pager # [phone] 10/01/2015 12:13 PM

## 2015-10-01 NOTE — Consult Note (Signed)
Cardiology Consult    Patient ID: [name] MRN: [id number], DOB/AGE: July 01, 1932   Admit date: 09/24/2015 Date of Consult: 10/01/2015  Primary Physician: [name] Primary Cardiologist: Dr. Bronson Ing Requesting Provider: Dr. Joya Salm   Patient Profile    [name] is a 82 yo female with PMH of CAD, with history of drug-eluting stents the left circumflex in January 2015, proximal LAD showed mild nonobstructive disease with moderately severe disease in a small nondominant right coronary artery, along with hypertension, hyperlipidemia, chronic diastolic heart failure, depression and severe panic attacks who presented for neurosurgery on 09/23/2015 for decompression and fusion at L3-4, 4-5. She developed chest pain while in the ICU recovering from surgery.  Past Medical History   Past Medical History  Diagnosis Date  . PVD (peripheral vascular disease) ([place])   . Other and unspecified hyperlipidemia   . Panic disorder without agoraphobia   . HTN (hypertension)     Hx of it  . MI (myocardial infarction) ([place]) 2006  . Internal hemorrhoids without mention of complication   . Dysphagia, unspecified(787.20)   . Microscopic colitis 2003  . GERD (gastroesophageal reflux disease)     Hx Schatzki's ring, multiple EGD/ED last 01/06/2004  . Hyperlipemia   . Thyroid disease     recent abnl TSH per pt  . S/P colonoscopy 09/27/2001    internal hemorrhoids, desc colon inflam polyp, SB BX-chronic duodenitis, colitis  . Arthritis   . Shortness of breath   . Heart disease   . Bursitis     left shoulder  . Hyperlipidemia   . Sleep disorder     obstructive  . COPD (chronic obstructive pulmonary disease) ([place])   . Complication of anesthesia   . PONV (postoperative nausea and vomiting)     'a little nausea"  . Anxiety   . Depression   . Pneumonia 12/2011  . Heart murmur     'a littel'  . Cataract   . Paresthesia     hands, feet  . CHF (congestive heart failure) ([place])   .  Headache   . Coronary atherosclerosis of native coronary artery     DES to CX, moderately severe stenosis RCA, mild stenosis LAD 04/2013    Past Surgical History  Procedure Laterality Date  . Tonsillectomy    . Unspecified area, hysterectomy  1972    partial  . Cholecystectomy  1998  . Right knee replacement  2007  . Right leg benign tumor    . Breast lumpectomy  1998    left, benign  . Left hand surgery    . Left rotator cuff surgery    . Nasal sinus surgery    . Bladder tack  06/2010  . Maloney dilation  03/16/2011    Gastritis. No H.pylori on bx. 36F maloney dilation with disruption of  occult cevical esophageal web  . Colonoscopy  03/16/2011    multiple hyperplastic colon polyps, sigmoid diverticulosis, melanosis coli  . Bladder suspension  11/09/2011    Procedure: TRANSVAGINAL TAPE (TVT) PROCEDURE;  Surgeon: Marissa Nestle, MD;  Location: [facility] ORS;  Service: Urology;  Laterality: N/A;  . Abdominal hysterectomy    . Appendectomy    . Carpal tunnel release  1989    left  . Anterior and posterior repair      with resection of vagina  . Shoulder surgery Left   . Joint replacement Right 2007  . Lumbar laminectomy/decompression microdiscectomy N/A 10/11/2012    Procedure: LUMBAR LAMINECTOMY/DECOMPRESSION MICRODISCECTOMY 2  LEVELS;  Surgeon: Floyce Stakes, MD;  Location: [facility];  Service: Neurosurgery;  Laterality: N/A;  L3-4 L4-5 Laminectomy  . Left heart catheterization with coronary angiogram N/A 05/14/2013    Procedure: LEFT HEART CATHETERIZATION WITH CORONARY ANGIOGRAM;  Surgeon: Blane Ohara, MD;  Location: [facility] C[facility] LAB;  Service: Cardiovascular;  Laterality: N/A;  . Cardiac catheterization    . Cardiac catheterization    . Coronary angioplasty with stent placement    . Colonoscopy with propofol N/A 07/05/2015    RMR:one 5 mm polyp in descending colon  . Esophagogastroduodenoscopy (egd) with propofol N/A 07/05/2015    [id number]  . Esophageal dilation N/A 07/05/2015     Procedure: ESOPHAGEAL DILATION;  Surgeon: Daneil Dolin, MD;  Location: [facility]O SUITE;  Service: Endoscopy;  Laterality: N/A;  . Biopsy  07/05/2015    Procedure: BIOPSY;  Surgeon: Daneil Dolin, MD;  Location: [facility] SU[facility];  Service: Endoscopy;;  gastric polyp biopsy, ascending colon biopsy  . Lumbar wound debridement N/A 09/27/2015    Procedure: Exploration of Lumbar Wound w/ Repair CSF Leak/Lumbar Drain Placement;  Surgeon: Leeroy Cha, MD;  Location: [facility] ORS;  Service: Neurosurgery;  Laterality: N/A;     Allergies  Allergies  Allergen Reactions  . Cephalosporins Diarrhea and Nausea Only    Lightheaded  . Levaquin [Levofloxacin In D5w] Swelling  . Macrodantin [Nitrofurantoin Macrocrystal] Swelling  . Phenothiazines Anaphylaxis and Hives  . Polysorbate Anaphylaxis  . Prednisone Shortness Of Breath  . Buspirone Itching  . Cardura [Doxazosin Mesylate] Itching  . Codeine Itching  . Prochlorperazine Other (See Comments)    unknown  . Ranexa [Ranolazine]     Severe drop in BP  . Atorvastatin Hives    Cramping; tolerates Crestor ok  . Ofloxacin Rash  . Other Itching and Rash    "WOOL"= make skin look like it has been burned  . Penicillins Other (See Comments)    Causes redness all over. Has patient had a PCN reaction causing immediate rash, facial/tongue/throat swelling, SOB or lightheadedness with hypotension: No Has patient had a PCN reaction causing severe rash involving mucus membranes or skin necrosis: No Has patient had a PCN reaction that required hospitalization No Has patient had a PCN reaction occurring within the last 10 years: No If all of the above answers are "NO", then may proceed with Cephalosporin use.   . Pimozide Hives and Itching    History of Present Illness    [name] is a 82 female patient of Dr. Bronson Ing with PMH of CAD, with history of drug-eluting stents the left circumflex in January 2015, proximal LAD showed mild nonobstructive  disease with moderately severe disease in a small nondominant right coronary artery, along with hypertension, hyperlipidemia, chronic diastolic heart failure, depression and severe panic attacks. She was most recently seen in the office on 09/17/2015 by Bunnie Domino for clearance to undergo neurosurgery to correct lumbar disc disease. At that time she was without episodes of angina, dizziness, dyspnea and was cleared from a cardiac standpoint for surgery. At that time she was noted to no longer be taking her Ranexa and intolerant of statins due to myalgia. Her last stress test was in 8/15 which was abnormal due to question of a low ejection fraction but no evidence of ischemia. Recommended follow-up echo to determine LV function. Repeat echo done 07/2014 showed an EF of 55-60%, with no wall motion abnormality, and grade 2 diastolic dysfunction.  She presented on 09/23/2015 for  surgery with Dr. Joya Salm for decompression and fusion of the L3-4, 4-5 disc with a pedicle screw placed. She developed a CSF leak on 6/12 and was taken back to the OR for exploration of the lumbar wound and insertion of lumbar catheter drain. She was started on IV vancomycin and cefepime for treatment of presumed post neurological meningitis. She is currently in the neuro ICU and complained of multiple episodes of chest pain this morning which have since resolved at time of assessment. She denies any dizziness, lightheadedness, nausea, vomiting or palpitations. Of note she reports at home she only walks distances, including stairs and does not usually experience any chest pain or dyspnea on exertion.  Cardiology has been called in relation to patient's reports of chest pain.  Inpatient Medications    . aspirin EC  81 mg Oral q morning - 10a  . carvedilol  12.5 mg Oral BID  . ceFEPime (MAXIPIME) IV  2 g Intravenous Q12H  . DULoxetine  60 mg Oral QHS  . feeding supplement  1 Container Oral TID BM  . furosemide  20 mg Oral Daily  .  heparin subcutaneous  5,000 Units Subcutaneous Q8H  . senna  1 tablet Oral BID  . sodium chloride flush  3 mL Intravenous Q12H  . vancomycin  750 mg Intravenous Q12H    Family History    Family History  Problem Relation Age of Onset  . Coronary artery disease Other     family Hx-sons  . Cancer Other   . Stroke Other     family Hx  . Hypertension Other     family Hx  . Diabetes Brother   . Heart disease Son     before age 22  . Diabetes Son   . Stroke Daughter 45  . Stroke Mother     Social History    Social History   Social History  . Marital Status: Divorced    Spouse Name: N/A  . Number of Children: 5  . Years of Education: N/A   Occupational History  . retired    Social History Main Topics  . Smoking status: Former Smoker -- 1.00 packs/day for 64 years    Types: Cigarettes    Start date: 12/24/1947    Quit date: 11/17/2001  . Smokeless tobacco: Never Used     Comment: Quit smoking in 2003  . Alcohol Use: No  . Drug Use: No  . Sexual Activity: No   Other Topics Concern  . Not on file   Social History Narrative   Divorced.   Sister had colon perforation & died from complications in [place], [place]     Review of Systems    General:  No chills, fever, night sweats or weight changes.  Cardiovascular:  + chest pain, dyspnea on exertion, edema, orthopnea, palpitations, paroxysmal nocturnal dyspnea. Dermatological: No rash, lesions/masses Respiratory: No cough, + dyspnea Urologic: No hematuria, dysuria Abdominal:   No nausea, vomiting, diarrhea, bright red blood per rectum, melena, or hematemesis Neurologic:  No visual changes, wkns, changes in mental status. All other systems reviewed and are otherwise negative except as noted above.  Physical Exam    Blood pressure 138/52, pulse 72, temperature 98.6 F (37 C), temperature source Oral, resp. rate 18, height 5\' 1"  (1.549 m), weight 182 lb 1.6 oz (82.6 kg), SpO2 98 %.  General: Pleasant frail older female,  NAD Psych: Normal affect. Neuro: Alert and oriented X 3, slightly sluggish, but recently received pain medication. Moves  all extremities spontaneously. HEENT: Normal  Neck: Supple without bruits or JVD. Lungs:  Resp regular and unlabored, CTA. Heart: RRR no s3, s4, or murmurs. Abdomen: Soft, non-tender, non-distended, BS + x 4.  Extremities: No clubbing, cyanosis or edema. DP/PT/Radials 2+ and equal bilaterally.  Labs    Troponin (Point of Care Test) No results for input(s): TROPIPOC in the last 72 hours. No results for input(s): CKTOTAL, CKMB, TROPONINI in the last 72 hours. Lab Results  Component Value Date   WBC 3.9* 09/30/2015   HGB 7.1* 09/30/2015   HCT 21.4* 09/30/2015   MCV 88.8 09/30/2015   PLT 212 09/30/2015    Recent Labs Lab 09/29/15 0431 10/01/15 0427  NA  --  138  CREATININE 1.02*  --    Lab Results  Component Value Date   CHOL 181 11/24/2013   HDL 56 11/24/2013   LDLCALC 103* 11/24/2013   TRIG 111 11/24/2013   Lab Results  Component Value Date   DDIMER 0.32 11/01/2014    Radiology Studies    Dg Lumbar Spine 2-3 Views  09/24/2015  CLINICAL DATA:  Intraoperative surgery. FLUOROSCOPY TIME:  42 seconds. Images: 3 EXAM: LUMBAR SPINE - 2-3 VIEW COMPARISON:  None. FINDINGS: By the end of the study, pedicle screws have been placed at L3, L4, and L5. IMPRESSION: Pedicle screw placement as above. Electronically Signed   By: Dorise Bullion III M.D   On: 09/24/2015 14:58   Dg Lumbar Spine 2-3 Views  09/24/2015  CLINICAL DATA:  Surgeries at L3-4 and L4-5. EXAM: LUMBAR SPINE - 2-3 VIEW COMPARISON:  None. FINDINGS: Surgical instruments are seen posteriorly throughout the study, centered at L3-4 and L4-5. Multilevel degenerative changes. IMPRESSION: Intraoperative films as described above. Electronically Signed   By: Dorise Bullion III M.D   On: 09/24/2015 14:08   Dg C-arm 1-60 Min  09/24/2015  CLINICAL DATA:  Surgery.  Spondylolisthesis EXAM: DG C-ARM 61-120 MIN  COMPARISON:  None. FINDINGS: Pedicle screws have been placed at L3, L4, and L5 by the end of the study. IMPRESSION: Pedicle screws been placed at L3, L4, L5. Electronically Signed   By: Dorise Bullion III M.D   On: 09/24/2015 14:52   ECG & Cardiac Imaging    EKG: Sinus rhythm, with T-wave abnormality in lead III, noted in previous tracings.  Echo: 07/30/2014  Study Conclusions  - Left ventricle: The cavity size was normal. Wall thickness was normal. Systolic function was normal. The estimated ejection fraction was in the range of 55% to 60%. Wall motion was normal; there were no regional wall motion abnormalities. Features are consistent with a pseudonormal left ventricular filling pattern, with concomitant abnormal relaxation and increased filling pressure (grade 2 diastolic dysfunction). - Aortic valve: Mildly calcified annulus. Trileaflet; mildly calcified leaflets. There was trivial regurgitation. Valve area (Vmax): 1.85 cm^2. - Mitral valve: Mildly calcified annulus. There was trivial regurgitation. - Left atrium: The atrium was mildly dilated. - Right atrium: Central venous pressure (est): 3 mm Hg. - Tricuspid valve: There was trivial regurgitation. - Pulmonary arteries: PA peak pressure: 23 mm Hg (S). - Pericardium, extracardiac: There was no pericardial effusion.  Impressions: - Normal LV wall thickness with LVEF 0000000, grade 2 diastolic dysfunction. Mild MAC. Mild left atrial enlargement. Sclerotic aortic valve with trivial aortic regurgitation. Normal estimated PASP 23 mmHg.   Assessment & Plan    [name] is a 47 female patient of Dr. Bronson Ing with PMH of CAD, with history of drug-eluting stents the left  circumflex in January 2015, proximal LAD showed mild nonobstructive disease with moderately severe disease in a small nondominant right coronary artery, along with hypertension, hyperlipidemia, chronic diastolic heart failure, depression  and severe panic attacks. She was most recently seen in the office on 09/17/2015 by Bunnie Domino for clearance to undergo neurosurgery to correct lumbar disc disease. At that time she was without episodes of angina, dizziness, dyspnea and was cleared from a cardiac standpoint for surgery. At that time she was noted to no longer be taking her Ranexa and intolerant of statins due to myalgia. Her last stress test was in 8/15 which was abnormal due to question of a low ejection fraction but no evidence of ischemia. Recommended follow-up echo to determine LV function. Repeat echo done 07/2014 showed an EF of 55-60%, with no wall motion abnormality, and grade 2 diastolic dysfunction. Presented for neurosurgery with decompression and fusion of L3-4, L4-5. Develop chest pain during admission post surgery.  1. Chest pain: She complains of intermittent episodes of left-sided chest pain, and some dyspnea on laying flat in the bed. In reviewing her telemetry she appears to have episodes of NSVT that are associated with the episodes of chest pain and dyspnea. Telemetry shows NSVT 10 and 13 beat run, with spontaneous conversion back to normal sinus rhythm. In reviewing her labs and her last potassium was noted to be 3.9 on 09/20/2015, and no potassium noted and IV fluids. --Will check potassium and magnesium, replete as necessary. --She is slightly hypertensive, so we do have room to increase beta blocker. Will increase from 12.5 twice a day to 25 twice a day. --Given she does have risk factors and known residual coronary disease would plan for potential Lexi scan stress test on Monday given she has been cleared for this procedure. --Repeat echo. --Initial troponin neg, will cycle  2. HTN: Please see above  3. HLD: Not currently on statin given she has history of significant myalgias.  Barnet Pall, NP-C Pager [phone] 10/01/2015, 9:35 AM   The patient was seen, examined and discussed with Reino Bellis, NP-C and I agree with the above.   82 year old female with known CAD, s/p PCI/DES in 1/15, residual prx LAD - mild nonobstructive disease, moderately severe disease in a small nondominant RCA. LVEF 55-60 on 07/2014., grade 2 diastolic dysfunction. She underwent decompression and fusion of the L3-4, 4-5 disc on 09/23/15. While in MICU she developed episodes of CP - pressure like associated with SOB. Telemetry at that time shows short episodes of SVT, possibly a-fib and nsVT - 11 beats.  Troponin is negative x1, ECG is unchanged from prior. We will oredr echocardiogram to reevaluate LVEF. Increase carvedilol to 25 mg po BID and plan for a Lexiscan nuclear stress test once she is clinically improved. The last stress test negative in 11/2013.   Ena Dawley, MD 10/01/2015

## 2015-10-01 NOTE — Consult Note (Signed)
   [facility] CM Inpatient Consult   10/01/2015  [name] 1932-10-26 [id number]  Patient screened for potential [facility] Management services for HX of HF.   Patient is eligible for [facility]. Electronic medical record reveals patient is a 82 yo female with PMH of CAD, with history of drug-eluting stents, proximal LAD showed mild nonobstructive disease with moderately severe disease in a small nondominant right coronary artery, along with hypertension, hyperlipidemia, chronic diastolic heart failure, depression and severe panic attacks who presented for neurosurgery on 09/23/2015 for decompression and fusion at L3-4, 4-5. Per chart patient's discharge plan is pending until PT/OT evaluations are done. .  If patient's has post hospital community care management needs please place a  [facility] Care Management consult. For questions please contact:   Natividad Brood, RN BSN [facility] Liaison  [phone] business mobile phone Toll free office [phone]

## 2015-10-01 NOTE — Progress Notes (Signed)
Patient c/o mid-sternal sharp, shooting chest pain 5/10. Patient on 2L [place], BP stable, Morphine 2 mg given IV, and stat EKG ordered per protocol. Patient then given Nitroglycerin SL. Patient reported that she takes Nitroglycerin at home for chest pain and when asked the frequency she stated "not very often". Patient reported decrease in pain. Dr. Cyndy Freeze notified of interventions done and EKG results. Cardiology to be consulted by MD. Troponin ordered as well. Nitroglycerin dose repeated. Will continue to monitor.

## 2015-10-01 NOTE — Progress Notes (Signed)
Pharmacy Antibiotic Note  [name] is a 82 y.o. female s/p lumbar fusion 6/10 and insertion of lumbar. Patient being treated empirically for post-NS meningitis. ID has seen patient  VT this am - 12  Plan: Increase Vancomycin to 750 mg q12h Continue cefepime 2 g q12h Creatinine in am  Height: 5\' 1"  (154.9 cm) Weight: 182 lb 1.6 oz (82.6 kg) IBW/kg (Calculated) : 47.8  Temp (24hrs), Avg:98.4 F (36.9 C), Min:98 F (36.7 C), Max:99.1 F (37.3 C)   Recent Labs Lab 09/29/15 0431 09/30/15 1530 10/01/15 0427  WBC  --  3.9*  --   CREATININE 1.02*  --   --   VANCOTROUGH  --   --  12    Estimated Creatinine Clearance: 42.8 mL/min (by C-G formula based on Cr of 1.02).    Allergies  Allergen Reactions  . Cephalosporins Diarrhea and Nausea Only    Lightheaded  . Levaquin [Levofloxacin In D5w] Swelling  . Macrodantin [Nitrofurantoin Macrocrystal] Swelling  . Phenothiazines Anaphylaxis and Hives  . Polysorbate Anaphylaxis  . Prednisone Shortness Of Breath  . Buspirone Itching  . Cardura [Doxazosin Mesylate] Itching  . Codeine Itching  . Prochlorperazine Other (See Comments)    unknown  . Ranexa [Ranolazine]     Severe drop in BP  . Atorvastatin Hives    Cramping; tolerates Crestor ok  . Ofloxacin Rash  . Other Itching and Rash    "WOOL"= make skin look like it has been burned  . Penicillins Other (See Comments)    Causes redness all over. Has patient had a PCN reaction causing immediate rash, facial/tongue/throat swelling, SOB or lightheadedness with hypotension: No Has patient had a PCN reaction causing severe rash involving mucus membranes or skin necrosis: No Has patient had a PCN reaction that required hospitalization No Has patient had a PCN reaction occurring within the last 10 years: No If all of the above answers are "NO", then may proceed with Cephalosporin use.   . Pimozide Hives and Itching    Antimicrobials this admission: Vancomycin 6/12 >>   Cefepime 6/15>>  Dose Adjustments: 6/16 Tr = 12 on 1g q24h  Levester Fresh, PharmD, BCPS, [facility] Clinical Pharmacist Pager [phone] 10/01/2015 8:38 AM

## 2015-10-01 NOTE — Progress Notes (Signed)
No acute events Awake and alert Moves legs well Back dry Keep flat with drain for one more day per Dr. Joya Salm

## 2015-10-02 ENCOUNTER — Inpatient Hospital Stay (HOSPITAL_COMMUNITY): Payer: Medicare Other

## 2015-10-02 ENCOUNTER — Other Ambulatory Visit (HOSPITAL_COMMUNITY): Payer: Medicare Other

## 2015-10-02 DIAGNOSIS — I10 Essential (primary) hypertension: Secondary | ICD-10-CM

## 2015-10-02 DIAGNOSIS — R079 Chest pain, unspecified: Secondary | ICD-10-CM

## 2015-10-02 LAB — ECHOCARDIOGRAM COMPLETE
E decel time: 197 msec
E/e' ratio: 12.26
FS: 26 % — AB (ref 28–44)
Height: 61 in
IVS/LV PW RATIO, ED: 1.01
LA ID, A-P, ES: 32 mm
LA diam end sys: 32 mm
LA diam index: 1.66 cm/m2
LA vol A4C: 38.9 ml
LA vol index: 21.6 mL/m2
LA vol: 41.6 mL
LV E/e' medial: 12.26
LV E/e'average: 12.26
LV PW d: 12.8 mm — AB (ref 0.6–1.1)
LV dias vol index: 29 mL/m2
LV dias vol: 56 mL (ref 46–106)
LV e' LATERAL: 8.81 cm/s
LV sys vol index: 12 mL/m2
LV sys vol: 23 mL (ref 14–42)
LVOT SV: 84 mL
LVOT VTI: 29.6 cm
LVOT area: 2.84 cm2
LVOT diameter: 19 mm
LVOT peak grad rest: 5 mmHg
LVOT peak vel: 115 cm/s
MV Dec: 197
MV Peak grad: 5 mmHg
MV pk A vel: 120 m/s
MV pk E vel: 108 m/s
Reg peak vel: 221 cm/s
Simpson's disk: 58
Stroke v: 33 ml
TAPSE: 23 mm
TDI e' lateral: 8.81
TDI e' medial: 6.09
TR max vel: 221 cm/s
Weight: 2913.6 oz

## 2015-10-02 LAB — BASIC METABOLIC PANEL
ANION GAP: 8 (ref 5–15)
BUN: <5 mg/dL — ABNORMAL LOW (ref 6–20)
CALCIUM: 8.5 mg/dL — AB (ref 8.9–10.3)
CHLORIDE: 97 mmol/L — AB (ref 101–111)
CO2: 34 mmol/L — ABNORMAL HIGH (ref 22–32)
CREATININE: 0.79 mg/dL (ref 0.44–1.00)
GFR calc non Af Amer: >60 mL/min (ref >60)
Glucose, Bld: 101 mg/dL — ABNORMAL HIGH (ref 65–99)
Potassium: 3.6 mmol/L (ref 3.5–5.1)
SODIUM: 139 mmol/L (ref 135–145)

## 2015-10-02 LAB — CBC WITH DIFFERENTIAL/PLATELET
BASOS ABS: 0.1 10*3/uL (ref 0.0–0.1)
Basophils Relative: 1 %
EOS ABS: 0.4 10*3/uL (ref 0.0–0.7)
Eosinophils Relative: 6 %
HEMATOCRIT: 27.7 % — AB (ref 36.0–46.0)
HEMOGLOBIN: 9 g/dL — AB (ref 12.0–15.0)
LYMPHS PCT: 26 %
Lymphs Abs: 1.6 10*3/uL (ref 0.7–4.0)
MCH: 29.2 pg (ref 26.0–34.0)
MCHC: 32.5 g/dL (ref 30.0–36.0)
MCV: 89.9 fL (ref 78.0–100.0)
MONOS PCT: 10 %
Monocytes Absolute: 0.6 10*3/uL (ref 0.1–1.0)
NEUTROS ABS: 3.5 10*3/uL (ref 1.7–7.7)
NEUTROS PCT: 57 %
Platelets: 313 10*3/uL (ref 150–400)
RBC: 3.08 MIL/uL — AB (ref 3.87–5.11)
RDW: 14.8 % (ref 11.5–15.5)
WBC: 6.2 10*3/uL (ref 4.0–10.5)

## 2015-10-02 NOTE — Progress Notes (Signed)
SUBJECTIVE: The patient is doing ok today.  At this time, she denies chest pain, shortness of breath, or any new concerns.  [name] Kitchen aspirin EC  81 mg Oral q morning - 10a  . carvedilol  25 mg Oral BID  . ceFEPime (MAXIPIME) IV  2 g Intravenous Q12H  . DULoxetine  60 mg Oral QHS  . feeding supplement  1 Container Oral TID BM  . furosemide  20 mg Oral Daily  . heparin subcutaneous  5,000 Units Subcutaneous Q8H  . senna  1 tablet Oral BID  . sodium chloride flush  3 mL Intravenous Q12H  . vancomycin  750 mg Intravenous Q12H   . sodium chloride    . sodium chloride 75 mL/hr at 10/01/15 2007  . lactated ringers Stopped (09/24/15 1515)    OBJECTIVE: Physical Exam: Filed Vitals:   10/02/15 0700 10/02/15 0800 10/02/15 0900 10/02/15 1000  BP:  143/61 139/56 140/52  Pulse: 76 78 74 74  Temp:  98.4 F (36.9 C)    TempSrc:  Oral    Resp: 15 20 18 15   Height:      Weight:      SpO2: 99% 100% 97% 98%    Intake/Output Summary (Last 24 hours) at 10/02/15 1052 Last data filed at 10/02/15 1000  Gross per 24 hour  Intake   1975 ml  Output   2085 ml  Net   -110 ml    Telemetry reveals sinus rhythm  GEN- The patient is elderly and ill appearing, sleeping but rouses Head- normocephalic, atraumatic Eyes-  Sclera clear, conjunctiva pink Ears- hearing intact Oropharynx- clear Neck- supple,   Lungs- decreased BS, normal work of breathing Heart- Regular rate and rhythm  GI- soft, NT, ND, + BS Extremities- no clubbing, cyanosis, + dependant edema Skin- no rash or lesion Psych- euthymic mood, full affect Neuro- strength and sensation are intact  LABS: Basic Metabolic Panel:  Recent Labs  10/01/15 0855 10/01/15 1840 10/02/15 0445  NA 137 138 139  K 2.7* 3.3* 3.6  CL 93* 94* 97*  CO2 34* 37* 34*  GLUCOSE 105* 163* 101*  BUN 6 5* <5*  CREATININE 0.84 0.96 0.79  CALCIUM 8.1* 8.5* 8.5*  MG 1.6*  --   --    Liver Function Tests: No results for input(s): AST, ALT, ALKPHOS,  BILITOT, PROT, ALBUMIN in the last 72 hours. No results for input(s): LIPASE, AMYLASE in the last 72 hours. CBC:  Recent Labs  10/01/15 1135 10/02/15 0445  WBC 6.1 6.2  NEUTROABS 3.7 3.5  HGB 8.7* 9.0*  HCT 26.3* 27.7*  MCV 90.4 89.9  PLT 270 313    ASSESSMENT AND PLAN:   Ms. Kratky is a 3 female patient of Dr. Bronson Ing with PMH of CAD, with history of drug-eluting stents the left circumflex in January 2015, proximal LAD showed mild nonobstructive disease with moderately severe disease in a small nondominant right coronary artery, along with hypertension, hyperlipidemia, chronic diastolic heart failure, depression and severe panic attacks.   Presented for neurosurgery with decompression and fusion of L3-4, L4-5. Develop chest pain during admission post surgery.  1. Chest pain:  Both typical and atypical features.  She has known CAD and is s/p PCI 1/15 No ekg changes and cardiac markers negative Agree with lexiscan myoview, perhaps Monday if ok from Neurosurgery standpoint Can transfer to telemetry from CV standpoint Echo pending  2. HTN:  Stable No change required today  3. HLD: Not currently on statin given  she has history of significant myalgias.   Thompson Grayer, MD 10/02/2015 10:52 AM

## 2015-10-02 NOTE — Progress Notes (Signed)
1202  Pt c/o chest pain constant in epigastric region 10/10.  Pt medication with 2 mg MSO4 followed by 1 nitro tab SL.  Response:  CP decreased to 5/10 and intermittent,  Nitro tab remains under tongue.  1256 Pt states CP increased to 7, remains in epigastric area.  Nitro tab X 1 SL repeated.  1330 Pt states CP decreased to 1/10.  BP stable.

## 2015-10-02 NOTE — Progress Notes (Signed)
Patient ID: [name], female   DOB: Oct 14, 1932, 82 y.o.   MRN: [id number] Subjective:  The patient is pleasant. She wants to sit up.  Objective: Vital signs in last 24 hours: Temp:  [97.6 F (36.4 C)-98.9 F (37.2 C)] 98.3 F (36.8 C) (06/17 0400) Pulse Rate:  [66-81] 80 (06/17 0500) Resp:  [10-30] 25 (06/17 0500) BP: (113-166)/(41-71) 159/70 mmHg (06/17 0500) SpO2:  [91 %-100 %] 91 % (06/17 0500)  Intake/Output from previous day: 06/16 0701 - 06/17 0700 In: 1803 [I.V.:1503; IV Piggyback:300] Out: 1921 [Urine:1750; Drains:171] Intake/Output this shift: Total I/O In: 800 [I.V.:750; IV Piggyback:50] Out: 964 [Urine:875; Drains:89]  Physical exam the patient is alert and pleasant. She is moving all 4 extremities. She is in Trendelenburg position. Her dressing is dry.   Lab Results:  Recent Labs  10/01/15 1135 10/02/15 0445  WBC 6.1 6.2  HGB 8.7* 9.0*  HCT 26.3* 27.7*  PLT 270 313   BMET  Recent Labs  10/01/15 1840 10/02/15 0445  NA 138 139  K 3.3* 3.6  CL 94* 97*  CO2 37* 34*  GLUCOSE 163* 101*  BUN 5* <5*  CREATININE 0.96 0.79  CALCIUM 8.5* 8.5*    Studies/Results: No results found.  Assessment/Plan: Postop day #7: We will change the patient's bed position to flat and observe if she has any drainage or headaches. We will continue the drain.  LOS: 8 days     [name] D 10/02/2015, 6:43 AM

## 2015-10-02 NOTE — Progress Notes (Signed)
Lumbar drain output decreased to 3-4cc/hour with goal of 5-10 cc/hour.  Drain bag lowered per orders.

## 2015-10-02 NOTE — Progress Notes (Signed)
Lumbar drain output decreased to 2cc for 1 hour.  Catheter and drsg intact.  Vertell Limber, MD notified.  Instructions to continue to monitor.

## 2015-10-02 NOTE — Progress Notes (Signed)
  Echocardiogram 2D Echocardiogram has been performed.  Bobbye Charleston 10/02/2015, 11:10 AM

## 2015-10-03 DIAGNOSIS — G009 Bacterial meningitis, unspecified: Secondary | ICD-10-CM

## 2015-10-03 DIAGNOSIS — D649 Anemia, unspecified: Secondary | ICD-10-CM

## 2015-10-03 LAB — CBC WITH DIFFERENTIAL/PLATELET
BASOS ABS: 0.1 10*3/uL (ref 0.0–0.1)
Basophils Relative: 1 %
EOS PCT: 5 %
Eosinophils Absolute: 0.3 10*3/uL (ref 0.0–0.7)
HEMATOCRIT: 27.1 % — AB (ref 36.0–46.0)
HEMOGLOBIN: 8.7 g/dL — AB (ref 12.0–15.0)
LYMPHS PCT: 28 %
Lymphs Abs: 2 10*3/uL (ref 0.7–4.0)
MCH: 29.6 pg (ref 26.0–34.0)
MCHC: 32.1 g/dL (ref 30.0–36.0)
MCV: 92.2 fL (ref 78.0–100.0)
Monocytes Absolute: 0.6 10*3/uL (ref 0.1–1.0)
Monocytes Relative: 8 %
NEUTROS ABS: 4.3 10*3/uL (ref 1.7–7.7)
NEUTROS PCT: 58 %
PLATELETS: 321 10*3/uL (ref 150–400)
RBC: 2.94 MIL/uL — AB (ref 3.87–5.11)
RDW: 15.1 % (ref 11.5–15.5)
WBC: 7.4 10*3/uL (ref 4.0–10.5)

## 2015-10-03 LAB — BASIC METABOLIC PANEL
ANION GAP: 6 (ref 5–15)
BUN: 8 mg/dL (ref 6–20)
CHLORIDE: 97 mmol/L — AB (ref 101–111)
CO2: 35 mmol/L — ABNORMAL HIGH (ref 22–32)
Calcium: 8.5 mg/dL — ABNORMAL LOW (ref 8.9–10.3)
Creatinine, Ser: 0.82 mg/dL (ref 0.44–1.00)
GFR calc Af Amer: >60 mL/min (ref >60)
GLUCOSE: 100 mg/dL — AB (ref 65–99)
POTASSIUM: 3.5 mmol/L (ref 3.5–5.1)
Sodium: 138 mmol/L (ref 135–145)

## 2015-10-03 LAB — CSF CULTURE W GRAM STAIN

## 2015-10-03 LAB — CSF CULTURE: CULTURE: NO GROWTH

## 2015-10-03 NOTE — Progress Notes (Signed)
SUBJECTIVE: The patient is doing better today.  At this time, she denies chest pain, shortness of breath, or any new concerns.  [name] Kitchen aspirin EC  81 mg Oral q morning - 10a  . carvedilol  25 mg Oral BID  . ceFEPime (MAXIPIME) IV  2 g Intravenous Q12H  . DULoxetine  60 mg Oral QHS  . feeding supplement  1 Container Oral TID BM  . furosemide  20 mg Oral Daily  . heparin subcutaneous  5,000 Units Subcutaneous Q8H  . senna  1 tablet Oral BID  . sodium chloride flush  3 mL Intravenous Q12H  . vancomycin  750 mg Intravenous Q12H   . sodium chloride    . sodium chloride 75 mL/hr at 10/03/15 0108  . lactated ringers Stopped (09/24/15 1515)    OBJECTIVE: Physical Exam: Filed Vitals:   10/03/15 0700 10/03/15 0800 10/03/15 0900 10/03/15 1000  BP: 147/48 123/106 137/92 145/83  Pulse: 73 73 74 73  Temp:  98.1 F (36.7 C)    TempSrc:  Oral    Resp: 20 20 25 18   Height:      Weight:      SpO2: 100% 100% 100% 99%    Intake/Output Summary (Last 24 hours) at 10/03/15 1130 Last data filed at 10/03/15 1010  Gross per 24 hour  Intake   3100 ml  Output   3234 ml  Net   -134 ml    Telemetry reveals sinus rhythm  GEN- The patient is elderly and ill appearing, more alert today Head- normocephalic, atraumatic Eyes-  Sclera clear, conjunctiva pink Ears- hearing intact Oropharynx- clear Neck- supple,   Lungs- decreased BS, normal work of breathing Heart- Regular rate and rhythm  GI- soft, NT, ND, + BS Extremities- no clubbing, cyanosis, + dependant edema Skin- no rash or lesion Psych- euthymic mood, full affect Neuro- strength and sensation are intact  LABS: Basic Metabolic Panel:  Recent Labs  10/01/15 0855  10/02/15 0445 10/03/15 0420  NA 137  < > 139 138  K 2.7*  < > 3.6 3.5  CL 93*  < > 97* 97*  CO2 34*  < > 34* 35*  GLUCOSE 105*  < > 101* 100*  BUN 6  < > <5* 8  CREATININE 0.84  < > 0.79 0.82  CALCIUM 8.1*  < > 8.5* 8.5*  MG 1.6*  --   --   --   < > = values in  this interval not displayed. Liver Function Tests: No results for input(s): AST, ALT, ALKPHOS, BILITOT, PROT, ALBUMIN in the last 72 hours. No results for input(s): LIPASE, AMYLASE in the last 72 hours. CBC:  Recent Labs  10/02/15 0445 10/03/15 0420  WBC 6.2 7.4  NEUTROABS 3.5 4.3  HGB 9.0* 8.7*  HCT 27.7* 27.1*  MCV 89.9 92.2  PLT 313 321    ASSESSMENT AND PLAN:   [name] is a 46 female patient of Dr. Bronson Ing with PMH of CAD, with history of drug-eluting stents the left circumflex in January 2015, proximal LAD showed mild nonobstructive disease with moderately severe disease in a small nondominant right coronary artery, along with hypertension, hyperlipidemia, chronic diastolic heart failure, depression and severe panic attacks.   Presented for neurosurgery with decompression and fusion of L3-4, L4-5. Develop chest pain during admission post surgery.  1. Chest pain:  Both typical and atypical features.  She has known CAD and is s/p PCI 1/15 No ekg changes and cardiac markers negative Echo  is low risk No further symptoms I think given her current medical issues, a conservative approach is best. Lets let her heal up and then consider myoview electively (possibly as an outpatient)  2. HTN:  Stable No change required today  3. HLD: Not currently on statin given she has history of significant myalgias.   Thompson Grayer, MD 10/03/2015 11:30 AM

## 2015-10-03 NOTE — Progress Notes (Signed)
Patient ID: [name], female   DOB: 11-07-1932, 82 y.o.   MRN: [id number] Subjective:  the patient is alert and pleasant. She wants to sit up. She denies headaches.  Objective: Vital signs in last 24 hours: Temp:  [97.8 F (36.6 C)-98.6 F (37 C)] 98.1 F (36.7 C) (06/18 0800) Pulse Rate:  [65-81] 73 (06/18 1000) Resp:  [12-25] 18 (06/18 1000) BP: (106-166)/(46-112) 145/83 mmHg (06/18 1000) SpO2:  [94 %-100 %] 99 % (06/18 1000)  Intake/Output from previous day: 06/17 0701 - 06/18 0700 In: 2670 [P.O.:570; I.V.:1650; IV Piggyback:450] Out: 2871 [Urine:2725; Drains:146] Intake/Output this shift: Total I/O In: 875 [P.O.:597; I.V.:228; IV Piggyback:50] Out: [id number] [Urine:625; Drains:6]  Physical exam the patient is alert and oriented. Her dressing is clean and dry. She is moving her lower extremities well.  Her lumbar drain has been putting out approximately 5 mL per hour.  Lab Results:  Recent Labs  10/02/15 0445 10/03/15 0420  WBC 6.2 7.4  HGB 9.0* 8.7*  HCT 27.7* 27.1*  PLT 313 321   BMET  Recent Labs  10/02/15 0445 10/03/15 0420  NA 139 138  K 3.6 3.5  CL 97* 97*  CO2 34* 35*  GLUCOSE 101* 100*  BUN <5* 8  CREATININE 0.79 0.82  CALCIUM 8.5* 8.5*    Studies/Results: No results found.  Assessment/Plan: Postop day #9: We will raise the patient's head of bed to approximately 30.Perhaps we can get her out of bed and remove the drain tomorrow.   LOS: 9 days     Simon Aaberg D 10/03/2015, 10:32 AM

## 2015-10-03 NOTE — Progress Notes (Signed)
[place] for Infectious Disease    Date of Admission:  09/24/2015   Total days of antibiotics 10        Day 10 vanco        Day 4 cefepime           ID: [name] is a 82 y.o. 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 No pain today  Medications:  . aspirin EC  81 mg Oral q morning - 10a  . carvedilol  25 mg Oral BID  . ceFEPime (MAXIPIME) IV  2 g Intravenous Q12H  . DULoxetine  60 mg Oral QHS  . feeding supplement  1 Container Oral TID BM  . furosemide  20 mg Oral Daily  . heparin subcutaneous  5,000 Units Subcutaneous Q8H  . senna  1 tablet Oral BID  . sodium chloride flush  3 mL Intravenous Q12H  . vancomycin  750 mg Intravenous Q12H    Objective: Vital signs in last 24 hours: Temp:  [97.8 F (36.6 C)-98.6 F (37 C)] 98.1 F (36.7 C) (06/18 0800) Pulse Rate:  [65-81] 74 (06/18 0900) Resp:  [12-25] 25 (06/18 0900) BP: (106-166)/(46-112) 137/92 mmHg (06/18 0900) SpO2:  [94 %-100 %] 100 % (06/18 0900) Physical Exam  Constitutional:  oriented to person, place, and time. appears well-developed and well-nourished. No distress.  HENT: /AT, PERRLA, no scleral icterus Mouth/Throat: Oropharynx is clear and moist. No oropharyngeal exudate.  Cardiovascular: Normal rate, regular rhythm and normal heart sounds. Exam reveals no gallop and no friction rub.  No murmur heard.  Pulmonary/Chest: Effort normal and breath sounds normal. No respiratory distress.  has no wheezes.  Neck = supple, no nuchal rigidity Abdominal: Soft. Bowel sounds are normal.  exhibits no distension. There is no  tenderness.  Back: lumbar drain in place Lymphadenopathy: no cervical adenopathy. No axillary adenopathy Neurological: alert and oriented to person, place, and time.  Skin: Skin is warm and dry. No rash noted. No erythema.  Psychiatric: a normal mood and affect.  behavior is normal.     Lab Results  Recent Labs  10/02/15 0445 10/03/15 0420  WBC 6.2 7.4  HGB 9.0* 8.7*  HCT 27.7* 27.1*  NA 139 138  K 3.6 3.5  CL 97* 97*  CO2 34* 35*  BUN <5* 8  CREATININE 0.79 0.82    Microbiology: 6/14 csf NGTD Studies/Results: No results found.   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 WBC on gram stain, but no organisms. Clinically patient continues to do well. We are treating empirically for post-neurosurgical bacterial meningitis.  -Vancomycin and Cefepime for 2 weeks (6/15>> 6/28)  Spondylolithesis s/p Lumbar Facetectomy and Decompression: Patient states pain is controlled. Drain in place through today per neurosurgery  CAD s/p PCI to LCx: Patient is having intermittent epigastric chest pain yesterday, partially responding to nitro. Cardiology following as well. TTE did not suggest worsening EF. Negative troponins. Consider giving omeprazole to see if it continues  Hypokalemia:  resolved  Normocytic anemia: Hgb 9, still a few points below baseline. Continue to monitor   Baxter Flattery [facility] for Infectious Diseases Cell: [phone] Pager: [phone]  10/03/2015, 9:31 AM

## 2015-10-04 LAB — CBC WITH DIFFERENTIAL/PLATELET
BASOS ABS: 0.1 10*3/uL (ref 0.0–0.1)
BASOS PCT: 1 %
EOS ABS: 0.4 10*3/uL (ref 0.0–0.7)
Eosinophils Relative: 5 %
HEMATOCRIT: 27.7 % — AB (ref 36.0–46.0)
HEMOGLOBIN: 8.8 g/dL — AB (ref 12.0–15.0)
Lymphocytes Relative: 27 %
Lymphs Abs: 2.1 10*3/uL (ref 0.7–4.0)
MCH: 29.2 pg (ref 26.0–34.0)
MCHC: 31.8 g/dL (ref 30.0–36.0)
MCV: 92 fL (ref 78.0–100.0)
Monocytes Absolute: 0.6 10*3/uL (ref 0.1–1.0)
Monocytes Relative: 8 %
NEUTROS ABS: 4.5 10*3/uL (ref 1.7–7.7)
NEUTROS PCT: 59 %
Platelets: 351 10*3/uL (ref 150–400)
RBC: 3.01 MIL/uL — ABNORMAL LOW (ref 3.87–5.11)
RDW: 15.2 % (ref 11.5–15.5)
WBC: 7.6 10*3/uL (ref 4.0–10.5)

## 2015-10-04 LAB — BASIC METABOLIC PANEL
ANION GAP: 7 (ref 5–15)
BUN: 10 mg/dL (ref 6–20)
CALCIUM: 8.5 mg/dL — AB (ref 8.9–10.3)
CHLORIDE: 97 mmol/L — AB (ref 101–111)
CO2: 32 mmol/L (ref 22–32)
Creatinine, Ser: 0.84 mg/dL (ref 0.44–1.00)
GFR calc non Af Amer: >60 mL/min (ref >60)
Glucose, Bld: 100 mg/dL — ABNORMAL HIGH (ref 65–99)
Potassium: 3.7 mmol/L (ref 3.5–5.1)
SODIUM: 136 mmol/L (ref 135–145)

## 2015-10-04 LAB — VANCOMYCIN, TROUGH: VANCOMYCIN TR: 28 ug/mL — AB (ref 10.0–20.0)

## 2015-10-04 MED ORDER — AMLODIPINE BESYLATE 5 MG PO TABS
5.0000 mg | ORAL_TABLET | Freq: Every day | ORAL | Status: DC
  Administered 2015-10-04 – 2015-10-07 (×4): 5 mg via ORAL
  Filled 2015-10-04 (×5): qty 1

## 2015-10-04 MED ORDER — VANCOMYCIN HCL IN DEXTROSE 1-5 GM/200ML-% IV SOLN
1000.0000 mg | INTRAVENOUS | Status: DC
  Administered 2015-10-04 – 2015-10-07 (×4): 1000 mg via INTRAVENOUS
  Filled 2015-10-04 (×6): qty 200

## 2015-10-04 NOTE — Progress Notes (Signed)
Subjective: Patient reports Doing well no headache  Objective: Vital signs in last 24 hours: Temp:  [97.7 F (36.5 C)-98.6 F (37 C)] 97.7 F (36.5 C) (06/19 0800) Pulse Rate:  [64-79] 72 (06/19 1111) Resp:  [13-24] 22 (06/19 1111) BP: (109-156)/(42-97) 156/51 mmHg (06/19 1111) SpO2:  [93 %-100 %] 97 % (06/19 1111)  Intake/Output from previous day: 06/18 0701 - 06/19 0700 In: 3757 [P.O.:1554; I.V.:1803; IV Piggyback:400] Out: 3215 [Urine:3085; Drains:130] Intake/Output this shift: Total I/O In: 300 [I.V.:300] Out: 328 [Urine:300; Drains:28]  Neurologically nonfocal incision clean dry and intact  Lab Results:  Recent Labs  10/03/15 0420 10/04/15 0548  WBC 7.4 7.6  HGB 8.7* 8.8*  HCT 27.1* 27.7*  PLT 321 351   BMET  Recent Labs  10/03/15 0420 10/04/15 0548  NA 138 136  K 3.5 3.7  CL 97* 97*  CO2 35* 32  GLUCOSE 100* 100*  BUN 8 10  CREATININE 0.82 0.84  CALCIUM 8.5* 8.5*    Studies/Results: No results found.  Assessment/Plan: DC'd lumbar drain placed a bandage. The patient flatly 2 hours minimal*mobilizing with physical therapy  LOS: 10 days     [name] P 10/04/2015, 11:44 AM

## 2015-10-04 NOTE — Progress Notes (Signed)
[place] for Infectious Disease    Date of Admission:  09/24/2015     ID: JALECIA DO is a 82 y.o. 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   Active Problems:   Spondylolisthesis of lumbar region   CSF leak   Meningitis   Ischemic chest pain (HCC)   Coronary artery disease due to lipid rich plaque   NSVT (nonsustained ventricular tachycardia) (HCC)   PAF (paroxysmal atrial fibrillation) ([place])  Subjective: TOBEY HADORN is a 82 y.o. 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   Patient was seen and examined this morning. She denies any fever, chills, headache, neck stiffness, nausea or vomiting.    Medications:  . amLODipine  5 mg Oral Daily  . aspirin EC  81 mg Oral q morning - 10a  . carvedilol  25 mg Oral BID  . ceFEPime (MAXIPIME) IV  2 g Intravenous Q12H  . DULoxetine  60 mg Oral QHS  . feeding supplement  1 Container Oral TID BM  . furosemide  20 mg Oral Daily  . heparin subcutaneous  5,000 Units Subcutaneous Q8H  . senna  1 tablet Oral BID  . sodium chloride flush  3 mL Intravenous Q12H  . vancomycin  1,000 mg Intravenous Q24H    Objective: Filed Vitals:   10/04/15 1000 10/04/15 1100 10/04/15 1111 10/04/15 1200  BP:   156/51 155/62  Pulse: 74 70 72 73  Temp:    97.9 F (36.6 C)  TempSrc:    Oral  Resp: 17 18 22 27   Height:      Weight:      SpO2: 99% 98% 97% 96%   General: Vital signs reviewed.  Patient is elderly, in no acute distress and cooperative with exam.  Cardiovascular: RRR, S1 normal, S2 normal Pulmonary/Chest: Clear to auscultation bilaterally, no wheezes, rales, or rhonchi. Abdominal: Soft, non-tender, non-distended, BS +. Neurological: Awake, alert, oriented Skin: Warm, dry and  intact.   Lab Results  Recent Labs  10/03/15 0420 10/04/15 0548  WBC 7.4 7.6  HGB 8.7* 8.8*  HCT 27.1* 27.7*  NA 138 136  K 3.5 3.7  CL 97* 97*  CO2 35* 32  BUN 8 10  CREATININE 0.82 0.84   Antibiotics: Vancomycin 6/12>> Cefepime 6/15>>  Culture: CSF Culture 6/14: Orange, turbid, RBC 21525, WBC 725, Lymphs 81%, Neuts 1%. WBC on gram stain, but no organisms.   Assessment/Plan: Ms. Prueter is an 82 yo female with confusion and persistent CSF leak after lumbar facetectomy and decompression on 6/9 and found to have WBC in CSF.   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 WBC on gram stain, but no organisms. Patient has remained afebrile and clinically stable. Vanc trough high this morning at 28. Pharmacy plans to decrease vancomycin dose to 1 gram IV Q24H. -Vancomycin for 2 weeks (6/12>> 6/25) -D/C Cefepime as no gram negative growth -Consider PICC placement   Spondylolithesis s/p Lumbar Facetectomy and Decompression: Plans are to possibly remove lumbar drain today.  -Per Neurosurgery  CAD s/p PCI to LCx: Patient had an episode of chest pain on 6/16 and was evaluated by Cardiology. Her pain was felt to be both atypical and typical, but with no ekg changes, elevated cardiac  markers, or concerning finding on echo, plan was made for conservative approach and possible outpatient myoview.  -ASA 81 mg daily -Coreg BID  Normocytic Anemia: Hgb has been stable in the 8-9. Initial CBC early on 6/16 was falsely low as repeats have been in the 8-9 range.   Martyn Malay, DO PGY-2 Internal Medicine Resident Pager # [phone] 10/04/2015 1:24 PM

## 2015-10-04 NOTE — Progress Notes (Signed)
CRITICAL VALUE ALERT  Critical value received: Vanc trough of 28  Date of notification:  10/04/15  Time of notification:  0704  Critical value read back:Yes  Nurse who received alert: [place] and instructions received to stop infusion and wait for new order from pharmacy.

## 2015-10-04 NOTE — Care Management Important Message (Signed)
Important Message  Patient Details  Name: [name] MRN: [id number] Date of Birth: July 25, 1932   Medicare Important Message Given:  Yes    Nathen May 10/04/2015, 11:27 AM

## 2015-10-04 NOTE — Progress Notes (Addendum)
Pharmacy Antibiotic Note  [name] is a 82 y.o. female s/p lumbar fusion 6/10 and insertion of lumbar.Abx for post-neurosurgical bacterial meningitis + surgical ppx with lumbar drain. Afebrile, WBC wnl.  Aim to remove drain today. EF preserved on ECHO. SCr stable, CrCl ~8ml/min. RN stated patient received about 3/4 of the vancomycin dose this am.  Plan: Decrease vancomycin 1g IV Q24 Continue cefepime 2g IV Q12 Monitor clinical picture, renal function, VT prn F/U C&S, abx deescalation / LOT  Height: 5\' 1"  (154.9 cm) Weight: 182 lb 1.6 oz (82.6 kg) IBW/kg (Calculated) : 47.8  Temp (24hrs), Avg:98.3 F (36.8 C), Min:98.1 F (36.7 C), Max:98.6 F (37 C)   Recent Labs Lab 09/30/15 1530 10/01/15 0427 10/01/15 0855 10/01/15 1135 10/01/15 1840 10/02/15 0445 10/03/15 0420 10/04/15 0548  WBC 3.9*  --   --  6.1  --  6.2 7.4 7.6  CREATININE  --   --  0.84  --  0.96 0.79 0.82 0.84  VANCOTROUGH  --  12  --   --   --   --   --  28*    Estimated Creatinine Clearance: 52 mL/min (by C-G formula based on Cr of 0.84).    Allergies  Allergen Reactions  . Cephalosporins Diarrhea and Nausea Only    Lightheaded  . Levaquin [Levofloxacin In D5w] Swelling  . Macrodantin [Nitrofurantoin Macrocrystal] Swelling  . Phenothiazines Anaphylaxis and Hives  . Polysorbate Anaphylaxis  . Prednisone Shortness Of Breath  . Buspirone Itching  . Cardura [Doxazosin Mesylate] Itching  . Codeine Itching  . Prochlorperazine Other (See Comments)    unknown  . Ranexa [Ranolazine]     Severe drop in BP  . Atorvastatin Hives    Cramping; tolerates Crestor ok  . Ofloxacin Rash  . Other Itching and Rash    "WOOL"= make skin look like it has been burned  . Penicillins Other (See Comments)    Causes redness all over. Has patient had a PCN reaction causing immediate rash, facial/tongue/throat swelling, SOB or lightheadedness with hypotension: No Has patient had a PCN reaction causing severe rash  involving mucus membranes or skin necrosis: No Has patient had a PCN reaction that required hospitalization No Has patient had a PCN reaction occurring within the last 10 years: No If all of the above answers are "NO", then may proceed with Cephalosporin use.   . Pimozide Hives and Itching    Antimicrobials this admission: Vancomycin 6/12 >>  Cefepime 6/15 >>  Dose Adjustments: 6/16 VT = 12 on 1g q24h 6/19 VT = 28 on 750mg  IV Q12 > Decreased dose back to 1g IV Q24  Elenor Quinones, PharmD, [facility] Clinical Pharmacist Pager [phone] 10/04/2015 7:21 AM

## 2015-10-04 NOTE — Progress Notes (Signed)
Patient Profile   [name] is a 82 yo female with PMH of CAD, with history of drug-eluting stents the left circumflex in January 2015, proximal LAD showed mild nonobstructive disease with moderately severe disease in a small nondominant right coronary artery, along with hypertension, hyperlipidemia, chronic diastolic heart failure, depression and severe panic attacks who presented for neurosurgery on 09/23/2015 for decompression and fusion at L3-4, 4-5. She developed chest pain while in the ICU recovering from surgery.        Subjective: She had chest pain last night relieved with NTG, aching in chest and then SOB.  No pain now.   Objective: Vital signs in last 24 hours: Temp:  [98.1 F (36.7 C)-98.6 F (37 C)] 98.1 F (36.7 C) (06/19 0400) Pulse Rate:  [64-79] 77 (06/19 0700) Resp:  [13-25] 19 (06/19 0700) BP: (109-156)/(42-97) 156/61 mmHg (06/19 0700) SpO2:  [93 %-100 %] 99 % (06/19 0700) Weight change:  Last BM Date: 09/23/15 Intake/Output from previous day:  +542 06/18 0701 - 06/19 0700 In: 3757 [P.O.:1554; I.V.:1803; IV Piggyback:400] Out: 3215 [Urine:3085; Drains:130] Intake/Output this shift:    PE: General:Pleasant affect, NAD Skin:Warm and dry, brisk capillary refill HEENT:normocephalic, sclera clear, mucus membranes moist Neck:supple, no JVD  Heart:S1S2 RRR without murmur, gallup, rub or click Lungs:clear without rales, rhonchi, or wheezes [id number], non tender, + BS, do not palpate liver spleen or masses Ext:no lower ext edema, 2+ pedal pulses, 2+ radial pulses, scd stockings in place Neuro:alert and oriented X 3, MAE, follows commands, + facial symmetry TELE:  SR with PACs.    Lab Results:  Recent Labs  10/03/15 0420 10/04/15 0548  WBC 7.4 7.6  HGB 8.7* 8.8*  HCT 27.1* 27.7*  PLT 321 351   BMET  Recent Labs  10/03/15 0420 10/04/15 0548  NA 138 136  K 3.5 3.7  CL 97* 97*  CO2 35* 32  GLUCOSE 100* 100*  BUN 8 10  CREATININE  0.82 0.84  CALCIUM 8.5* 8.5*    Recent Labs  10/01/15 0855  TROPONINI <0.03    Lab Results  Component Value Date   CHOL 181 11/24/2013   HDL 56 11/24/2013   LDLCALC 103* 11/24/2013   TRIG 111 11/24/2013   CHOLHDL 3.2 11/24/2013   Lab Results  Component Value Date   HGBA1C 5.9* 11/24/2013     Lab Results  Component Value Date   TSH 2.770 11/25/2013    Hepatic Function Panel No results for input(s): PROT, ALBUMIN, AST, ALT, ALKPHOS, BILITOT, BILIDIR, IBILI in the last 72 hours. No results for input(s): CHOL in the last 72 hours. No results for input(s): PROTIME in the last 72 hours.     Studies/Results: ECHO:  Study Conclusions  - Left ventricle: The cavity size was normal. Wall thickness was  increased in a pattern of mild LVH. Systolic function was normal.  The estimated ejection fraction was in the range of 55% to 60%.  Wall motion was normal; there were no regional wall motion  abnormalities. Left ventricular diastolic function parameters  were normal. - Atrial septum: No defect or patent foramen ovale was identified.  Medications: I have reviewed the patient's current medications. Scheduled Meds: . aspirin EC  81 mg Oral q morning - 10a  . carvedilol  25 mg Oral BID  . ceFEPime (MAXIPIME) IV  2 g Intravenous Q12H  . DULoxetine  60 mg Oral QHS  . feeding supplement  1 Container Oral TID BM  .  furosemide  20 mg Oral Daily  . heparin subcutaneous  5,000 Units Subcutaneous Q8H  . senna  1 tablet Oral BID  . sodium chloride flush  3 mL Intravenous Q12H  . vancomycin  1,000 mg Intravenous Q24H   Continuous Infusions: . sodium chloride    . sodium chloride 75 mL/hr at 10/04/15 0350  . lactated ringers Stopped (09/24/15 1515)   PRN Meds:.acetaminophen **OR** acetaminophen, diazepam, fluticasone, loperamide, menthol-cetylpyridinium **OR** phenol, morphine injection, nitroGLYCERIN, ondansetron (ZOFRAN) IV, oxyCODONE-acetaminophen, sodium chloride  flush  Assessment/Plan: Active Problems:   Spondylolisthesis of lumbar region   CSF leak   Meningitis   Ischemic chest pain (HCC)   Coronary artery disease due to lipid rich plaque   NSVT (nonsustained ventricular tachycardia) (HCC)   PAF (paroxysmal atrial fibrillation) ([place])   1. Chest pain neg troponins.  outpt nuc study vs prior to discharge Both typical and atypical features. She has known CAD and is s/p PCI 1/15 No ekg changes and cardiac markers negative-  Echo without regional wall motion abnormalities.  -on coreg 25 BID No ranexa severe hypotension in the past, imdur may cause headaches ?.   2. SVT vs. NSVT 11 beats on the 16th, PACs yesterday  3. CAD with hx DES  To LCX  4. 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 WBC on gram stain, but no organisms. Patient has remained afebrile and clinically stable. Vanc trough high this morning at 28. Pharmacy plans to decrease vancomycin dose to 1 gram IV Q24H. -Vancomycin and Cefepime for 2 weeks (6/15>> 6/28) -Consider PICC placement " -lumbar drain for CSF leak  5. HLD not currently on statin with hx of significant myalgias   LOS: 10 days   Time spent with pt. :15 minutes. Cecilie Kicks  Nurse Practitioner Certified Pager [phone] or after 5pm and on weekends call [phone] 10/04/2015, 8:46 AM   Brief recurrent CP last PM relieved with NTG; pt states not unusual for her to have pain at home. Plan to continue asa and coreg. Add amlodipine 5 mg daily; plan nuclear study once her back improves prior to DC. [name]

## 2015-10-05 MED ORDER — POTASSIUM CHLORIDE CRYS ER 20 MEQ PO TBCR
20.0000 meq | EXTENDED_RELEASE_TABLET | Freq: Every day | ORAL | Status: DC
  Administered 2015-10-05 – 2015-10-08 (×4): 20 meq via ORAL
  Filled 2015-10-05 (×4): qty 1

## 2015-10-05 MED ORDER — SODIUM CHLORIDE 0.9% FLUSH
10.0000 mL | INTRAVENOUS | Status: DC | PRN
  Administered 2015-10-08 (×2): 10 mL
  Filled 2015-10-05 (×2): qty 40

## 2015-10-05 NOTE — Progress Notes (Signed)
No acute events Back dry Moving legs well TTF PICC line as recommended by ID

## 2015-10-05 NOTE — Progress Notes (Signed)
[facility]  Patient Status:  New pt for [facility] this admission  [facility] is providing the following services: HHRN and Home Infusion Pharmacy team for home IV ABX at DC. [facility] hospital team will follow [name] to support transition home to ensure her [facility] care needs are met.   If patient discharges after hours, please call [phone].   Larry Sierras 10/05/2015, 2:13 PM

## 2015-10-05 NOTE — Progress Notes (Signed)
Spoke w pt and da. Gave them [employer]. They are familiar w [facility] and want to use them for hhrn for iv antibiotics. Ref to pam and donna w [facility] for home iv antibiotics. May need rw,bsc,hosp bed. Will need orders for eq.will cont to follow.

## 2015-10-05 NOTE — Progress Notes (Signed)
Peripherally Inserted Central Catheter/Midline Placement  The IV Nurse has discussed with the patient and/or persons authorized to consent for the patient, the purpose of this procedure and the potential benefits and risks involved with this procedure.  The benefits include less needle sticks, lab draws from the catheter and patient may be discharged home with the catheter.  Risks include, but not limited to, infection, bleeding, blood clot (thrombus formation), and puncture of an artery; nerve damage and irregular heat beat.  Alternatives to this procedure were also discussed.  PICC/Midline Placement Documentation  PICC Single Lumen 10/05/15 PICC Right Brachial 37 cm 1 cm (Active)  Indication for Insertion or Continuance of Line Home intravenous therapies (PICC only) 10/05/2015  1:00 PM  Exposed Catheter (cm) 1 cm 10/05/2015  1:00 PM  Dressing Change Due 10/12/15 10/05/2015  1:00 PM       Jule Economy Horton 10/05/2015, 1:19 PM

## 2015-10-05 NOTE — Progress Notes (Signed)
Patient transferred to 5M10 via wheelchair, on tele, with belongings.  Patient transferred to bed and left with RN Jeannie Done in room. [name] C 10:28 AM

## 2015-10-05 NOTE — Progress Notes (Signed)
Patient: [name] / Admit Date: 09/24/2015 / Date of Encounter: 10/05/2015, 10:53 AM   Subjective: Denies further CP. "I feel pretty good" except some hip pain.  She shares with me that she has 8 cats at home - her son brought up a delightful cat pillow to the hospital since real animals are not allowed here.  Objective: Telemetry: no data to review (patient transferred from 43M this AM) Physical Exam: Blood pressure 129/52, pulse 79, temperature 97.8 F (36.6 C), temperature source Oral, resp. rate 19, height 5\' 1"  (1.549 m), weight 182 lb 1.6 oz (82.6 kg), SpO2 98 %. General: Well developed, well nourished WF in no acute distress. Head: Normocephalic, atraumatic, sclera non-icteric, no xanthomas, nares are without discharge. Neck: Negative for carotid bruits. JVP not elevated. Lungs: Clear bilaterally to auscultation without wheezes, rales, or rhonchi. Breathing is unlabored. Heart: RRR S1 S2 without murmurs, rubs, or gallops.  Abdomen: Soft, non-tender, non-distended with normoactive bowel sounds. No rebound/guarding. Extremities: No clubbing or cyanosis. No edema. Distal pedal pulses are 2+ and equal bilaterally. Neuro: Alert and oriented X 3. Moves all extremities spontaneously. Psych:  Responds to questions appropriately with a normal affect.   Intake/Output Summary (Last 24 hours) at 10/05/15 1053 Last data filed at 10/05/15 0800  Gross per 24 hour  Intake   2025 ml  Output   3075 ml  Net  -1050 ml    Inpatient Medications:  . amLODipine  5 mg Oral Daily  . aspirin EC  81 mg Oral q morning - 10a  . carvedilol  25 mg Oral BID  . DULoxetine  60 mg Oral QHS  . feeding supplement  1 Container Oral TID BM  . furosemide  20 mg Oral Daily  . heparin subcutaneous  5,000 Units Subcutaneous Q8H  . potassium chloride  20 mEq Oral Daily  . senna  1 tablet Oral BID  . sodium chloride flush  3 mL Intravenous Q12H  . vancomycin  1,000 mg Intravenous Q24H   Infusions:  . sodium  chloride    . sodium chloride 75 mL/hr at 10/05/15 0800  . lactated ringers Stopped (09/24/15 1515)    Labs:  Recent Labs  10/03/15 0420 10/04/15 0548  NA 138 136  K 3.5 3.7  CL 97* 97*  CO2 35* 32  GLUCOSE 100* 100*  BUN 8 10  CREATININE 0.82 0.84  CALCIUM 8.5* 8.5*   No results for input(s): AST, ALT, ALKPHOS, BILITOT, PROT, ALBUMIN in the last 72 hours.  Recent Labs  10/03/15 0420 10/04/15 0548  WBC 7.4 7.6  NEUTROABS 4.3 4.5  HGB 8.7* 8.8*  HCT 27.1* 27.7*  MCV 92.2 92.0  PLT 321 351   No results for input(s): CKTOTAL, CKMB, TROPONINI in the last 72 hours. Invalid input(s): POCBNP No results for input(s): HGBA1C in the last 72 hours.   Radiology/Studies:  Dg Lumbar Spine 2-3 Views  09/24/2015  CLINICAL DATA:  Intraoperative surgery. FLUOROSCOPY TIME:  42 seconds. Images: 3 EXAM: LUMBAR SPINE - 2-3 VIEW COMPARISON:  None. FINDINGS: By the end of the study, pedicle screws have been placed at L3, L4, and L5. IMPRESSION: Pedicle screw placement as above. Electronically Signed   By: Dorise Bullion III M.D   On: 09/24/2015 14:58   Dg Lumbar Spine 2-3 Views  09/24/2015  CLINICAL DATA:  Surgeries at L3-4 and L4-5. EXAM: LUMBAR SPINE - 2-3 VIEW COMPARISON:  None. FINDINGS: Surgical instruments are seen posteriorly throughout the study, centered at L3-4  and L4-5. Multilevel degenerative changes. IMPRESSION: Intraoperative films as described above. Electronically Signed   By: Dorise Bullion III M.D   On: 09/24/2015 14:08   Dg C-arm 1-60 Min  09/24/2015  CLINICAL DATA:  Surgery.  Spondylolisthesis EXAM: DG C-ARM 61-120 MIN COMPARISON:  None. FINDINGS: Pedicle screws have been placed at L3, L4, and L5 by the end of the study. IMPRESSION: Pedicle screws been placed at L3, L4, L5. Electronically Signed   By: Dorise Bullion III M.D   On: 09/24/2015 14:52     Assessment and Plan  65F with CAD (DES to Cx 04/2013, moderately severe dz in non-dominant RCA), HTN, HLD (intolerant of  statins), chronic diastolic CHF, severe panic attacks underwent decompression and fusion at L3-4, 4-5 on 09/23/15. She developed chest pain while in the ICU recovering from surgery with monitoring showing NSVT, SVT, PACs. Troponin negative. 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  1. Chest pain with history of CAD - troponin neg. Per patient, not unusual for her to have pain at home. Continue ASA, BB, amlodipine. Plan nuclear study once her back improves prior to DC. Will review timing with MD.  2. SVT vs NSVT, PACs - no telemetry to review as patient was transferred from [place]. Will place back on telemetry during her stay. Low Mg on 6/16 -> f/u in AM. Resume home potassium. Continue BB (increased this adm).  3. Chronic diastolic CHF - no significant volume overload. Continue oral home Lasix. Add back home potassium.  4. HTN - improved with addition of amlodipine.  Signed, Melina Copa PA-C Pager: [phone]  As above; patient seen and examined; no chest pain; continue present meds; plan nuclear study later this week [facility] [place]

## 2015-10-05 NOTE — Progress Notes (Signed)
Pt arrived to 5M10 @ 1023, Pt A&Ox 4, c/o pain 0/10. Dressing CDI, no drains. Pt VS taken, pt without distress. Diet ordered, will monitor.

## 2015-10-05 NOTE — Progress Notes (Signed)
[facility] for Infectious [facility]    Date of Admission:  09/24/2015     ID: [name] is a 82 y.o. 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   Active Problems:   Spondylolisthesis of lumbar region   CSF leak   Meningitis   Ischemic chest pain (HCC)   Coronary artery disease due to lipid rich plaque   NSVT (nonsustained ventricular tachycardia) (HCC)   PAF (paroxysmal atrial fibrillation) ([place])  Subjective: [name] is a 82 y.o. 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   Patient was seen and examined this morning. Patient is doing well, sitting up in her chair. She denies any complaint of headache, fever, chills, nausea or vomiting. She complains of lower extremity weakness.    Medications:  . amLODipine  5 mg Oral Daily  . aspirin EC  81 mg Oral q morning - 10a  . carvedilol  25 mg Oral BID  . DULoxetine  60 mg Oral QHS  . feeding supplement  1 Container Oral TID BM  . furosemide  20 mg Oral Daily  . heparin subcutaneous  5,000 Units Subcutaneous Q8H  . potassium chloride  20 mEq Oral Daily  . senna  1 tablet Oral BID  . sodium chloride flush  3 mL Intravenous Q12H  . vancomycin  1,000 mg Intravenous Q24H    Objective: Filed Vitals:   10/05/15 0845 10/05/15 0900 10/05/15 1000 10/05/15 1118  BP:  108/84 129/52 139/57  Pulse: 69 79  68  Temp:    98 F (36.7 C)  TempSrc:    Oral  Resp: 19 19 19 20   Height:      Weight:      SpO2: 96% 96% 98% 97%   General: Vital signs reviewed.  Patient is elderly, in no acute distress, sitting up in her chair.  Cardiovascular: RRR, S1 normal, S2 normal Pulmonary/Chest: Clear to auscultation bilaterally Abdominal: Soft, non-tender, non-distended, BS  +. Neurological: Awake, alert, oriented  Lab Results  Recent Labs  10/03/15 0420 10/04/15 0548  WBC 7.4 7.6  HGB 8.7* 8.8*  HCT 27.1* 27.7*  NA 138 136  K 3.5 3.7  CL 97* 97*  CO2 35* 32  BUN 8 10  CREATININE 0.82 0.84   Antibiotics: Vancomycin 6/12>> Cefepime 6/15>>6/19  Culture: CSF Culture 6/14: Orange, turbid, RBC 21525, WBC 725, Lymphs 81%, Neuts 1%. WBC on gram stain, but no organisms.   Assessment/Plan: [name] is an 82 yo female with confusion and persistent CSF leak after lumbar facetectomy and decompression on 6/9 and found to have WBC in CSF.   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 WBC on gram stain, but no organisms. Clinically, patient has done very well.  -Vancomycin for 2 weeks (6/12>> 6/25) -PICC ordered -Will follow up with ID -ID will sign off  Diagnosis: Post-Neurosurgical Meningitis  Culture Result: WBC, no organisms  Allergies  Allergen Reactions  . Cephalosporins Diarrhea and Nausea Only    Lightheaded  . Levaquin [Levofloxacin In D5w] Swelling  . Macrodantin [Nitrofurantoin Macrocrystal] Swelling  . Phenothiazines Anaphylaxis and Hives  . Polysorbate Anaphylaxis  . Prednisone Shortness Of Breath  . Buspirone Itching  . Cardura [Doxazosin Mesylate]  Itching  . Codeine Itching  . Prochlorperazine Other (See Comments)    unknown  . Ranexa [Ranolazine]     Severe drop in BP  . Atorvastatin Hives    Cramping; tolerates Crestor ok  . Ofloxacin Rash  . Other Itching and Rash    "WOOL"= make skin look like it has been burned  . Penicillins Other (See Comments)    Causes redness all over. Has patient had a PCN reaction causing immediate rash, facial/tongue/throat swelling, SOB or lightheadedness with hypotension: No Has patient had a PCN reaction causing severe rash involving mucus membranes or skin necrosis: No Has patient had a PCN reaction that  required hospitalization No Has patient had a PCN reaction occurring within the last 10 years: No If all of the above answers are "NO", then may proceed with Cephalosporin use.   . Pimozide Hives and Itching    Discharge antibiotics: Vancomycin per pharmacy protocol  Aim for Vancomycin trough 15-20 (unless otherwise indicated) Duration: 2 weeks End Date: 10/10/15  [facility] Care Per Protocol:  Labs weekly while on IV antibiotics: _x_ CBC with differential _x_ CMP twice weekly _x_ CRP _x_ ESR _x_ Vancomycin trough  Fax weekly labs to [phone]  Spondylolithesis s/p Lumbar Facetectomy and Decompression: Lumbar drain removed yesterday.  -Transferring out to floor -PT eval and treat  CAD s/p PCI to LCx: Patient had an episode of chest pain on 6/16 and was evaluated by Cardiology. Her pain was felt to be both atypical and typical, but with no ekg changes, elevated cardiac markers, or concerning finding on echo, plan was made for conservative approach and possible outpatient myoview.  -ASA 81 mg daily -Coreg BID  Martyn Malay, DO PGY-2 Internal Medicine Resident Pager # [phone] 10/05/2015 1:09 PM

## 2015-10-06 DIAGNOSIS — R5381 Other malaise: Secondary | ICD-10-CM

## 2015-10-06 LAB — MAGNESIUM: Magnesium: 1.9 mg/dL (ref 1.7–2.4)

## 2015-10-06 LAB — BASIC METABOLIC PANEL
Anion gap: 10 (ref 5–15)
BUN: 13 mg/dL (ref 6–20)
CHLORIDE: 97 mmol/L — AB (ref 101–111)
CO2: 31 mmol/L (ref 22–32)
CREATININE: 1.04 mg/dL — AB (ref 0.44–1.00)
Calcium: 9.3 mg/dL (ref 8.9–10.3)
GFR calc Af Amer: 57 mL/min — ABNORMAL LOW (ref >60)
GFR calc non Af Amer: 49 mL/min — ABNORMAL LOW (ref >60)
GLUCOSE: 105 mg/dL — AB (ref 65–99)
POTASSIUM: 3.8 mmol/L (ref 3.5–5.1)
Sodium: 138 mmol/L (ref 135–145)

## 2015-10-06 NOTE — Consult Note (Signed)
Physical Medicine and Rehabilitation Consult Reason for Consult: Lumbar radiculopathy with postop cerebrospinal fluid leak Referring Physician: Dr. Joya Salm   HPI: [name] is a 82 y.o. right handed female with history of lumbar decompression in 2014. Patient lives with her son. Independent prior to admission. Son is on disability but can assist. 1 level home with ramp to entry. Presented 09/23/2015 with low back pain radiating to the lower extremities. Denied bowel or bladder disturbances. X-rays and imaging revealed chronic radiculopathy, facet arthropathy, spondylolisthesis L3-4, 4-5. Underwent bilateral L3 and for facetectomy lysis of adhesions decompression of thecal sac foraminotomies to decompress L3-4 and L5 nerve roots pedicle screws 09/25/2015 per Dr. Joya Salm. Hospital course pain management. Postoperative CSF leak and underwent exploration of lumbar drain insertion of lumbar catheter 09/27/2015. Infectious disease consult and 09/30/2015 for elevated WBC in the CSF. CSF showed pleocytosis-lymphocyte predominant with WBC 725. Maintain on vancomycin for 2 weeks 6-12 until 6-25. Acute blood loss anemia 8.8 and monitored. Subcutaneous heparin for DVT prophylaxis. Physical therapy evaluation completed 10/06/2015 with recommendations of physical medicine rehabilitation consult. Patient with back brace applied in sitting position.   Review of Systems  Constitutional: Negative for fever and chills.  HENT: Negative for hearing loss.   Eyes: Negative for blurred vision and double vision.  Respiratory: Negative for cough and shortness of breath.   Cardiovascular: Negative for chest pain and leg swelling.  Gastrointestinal: Positive for constipation. Negative for nausea and vomiting.       GERD  Genitourinary: Positive for urgency. Negative for dysuria and hematuria.  Musculoskeletal: Positive for myalgias and back pain.  Skin: Negative for rash.  Neurological: Positive for weakness.  Negative for seizures, loss of consciousness and headaches.  Psychiatric/Behavioral:       Panic attacks  All other systems reviewed and are negative.  Past Medical History  Diagnosis Date  . PVD (peripheral vascular disease) ([place])   . Other and unspecified hyperlipidemia   . Panic disorder without agoraphobia   . HTN (hypertension)     Hx of it  . MI (myocardial infarction) ([place]) 2006  . Internal hemorrhoids without mention of complication   . Dysphagia, unspecified(787.20)   . Microscopic colitis 2003  . GERD (gastroesophageal reflux disease)     Hx Schatzki's ring, multiple EGD/ED last 01/06/2004  . Hyperlipemia   . Thyroid disease     recent abnl TSH per pt  . S/P colonoscopy 09/27/2001    internal hemorrhoids, desc colon inflam polyp, SB BX-chronic duodenitis, colitis  . Arthritis   . Shortness of breath   . Heart disease   . Bursitis     left shoulder  . Hyperlipidemia   . Sleep disorder     obstructive  . COPD (chronic obstructive pulmonary disease) ([place])   . Complication of anesthesia   . PONV (postoperative nausea and vomiting)     'a little nausea"  . Anxiety   . Depression   . Pneumonia 12/2011  . Heart murmur     'a littel'  . Cataract   . Paresthesia     hands, feet  . CHF (congestive heart failure) ([place])   . Headache   . Coronary atherosclerosis of native coronary artery     a. DES to CX, moderately severe stenosis RCA, mild stenosis LAD 04/2013   Past Surgical History  Procedure Laterality Date  . Tonsillectomy    . Unspecified area, hysterectomy  1972    partial  . Cholecystectomy  1998  . Right knee replacement  2007  . Right leg benign tumor    . Breast lumpectomy  1998    left, benign  . Left hand surgery    . Left rotator cuff surgery    . Nasal sinus surgery    . Bladder tack  06/2010  . Maloney dilation  03/16/2011    Gastritis. No H.pylori on bx. 9F maloney dilation with disruption of  occult cevical esophageal web  . Colonoscopy   03/16/2011    multiple hyperplastic colon polyps, sigmoid diverticulosis, melanosis coli  . Bladder suspension  11/09/2011    Procedure: TRANSVAGINAL TAPE (TVT) PROCEDURE;  Surgeon: Marissa Nestle, MD;  Location: [facility];  Service: Urology;  Laterality: N/A;  . Abdominal hysterectomy    . Appendectomy    . Carpal tunnel release  1989    left  . Anterior and posterior repair      with resection of vagina  . Shoulder surgery Left   . Joint replacement Right 2007  . Lumbar laminectomy/decompression microdiscectomy N/A 10/11/2012    Procedure: LUMBAR LAMINECTOMY/DECOMPRESSION MICRODISCECTOMY 2 LEVELS;  Surgeon: Floyce Stakes, MD;  Location: [place] [facility];  Service: Neurosurgery;  Laterality: N/A;  L3-4 L4-5 Laminectomy  . Left heart catheterization with coronary angiogram N/A 05/14/2013    Procedure: LEFT HEART CATHETERIZATION WITH CORONARY ANGIOGRAM;  Surgeon: Blane Ohara, MD;  Location: [facility] CATH LAB;  Service: Cardiovascular;  Laterality: N/A;  . Cardiac catheterization    . Cardiac catheterization    . Coronary angioplasty with stent placement    . Colonoscopy with propofol N/A 07/05/2015    RMR:one 5 mm polyp in descending colon  . Esophagogastroduodenoscopy (egd) with propofol N/A 07/05/2015    [id number]  . Esophageal dilation N/A 07/05/2015    Procedure: ESOPHAGEAL DILATION;  Surgeon: Daneil Dolin, MD;  Location: [facility];  Service: Endoscopy;  Laterality: N/A;  . Biopsy  07/05/2015    Procedure: BIOPSY;  Surgeon: Daneil Dolin, MD;  Location: [facility];  Service: Endoscopy;;  gastric polyp biopsy, ascending colon biopsy  . Lumbar wound debridement N/A 09/27/2015    Procedure: Exploration of Lumbar Wound w/ Repair CSF Leak/Lumbar Drain Placement;  Surgeon: Leeroy Cha, MD;  Location: [place] [facility] ORS;  Service: Neurosurgery;  Laterality: N/A;   Family History  Problem Relation Age of Onset  . Coronary artery disease Other     family Hx-sons  . Cancer Other   . Stroke  Other     family Hx  . Hypertension Other     family Hx  . Diabetes Brother   . Heart disease Son     before age 9  . Diabetes Son   . Stroke Daughter 9  . Stroke Mother    Social History:  reports that she quit smoking about 13 years ago. Her smoking use included Cigarettes. She started smoking about 67 years ago. She has a 64 pack-year smoking history. She has never used smokeless tobacco. She reports that she does not drink alcohol or use illicit drugs. Allergies:  Allergies  Allergen Reactions  . Cephalosporins Diarrhea and Nausea Only    Lightheaded  . Levaquin [Levofloxacin In D5w] Swelling  . Macrodantin [Nitrofurantoin Macrocrystal] Swelling  . Phenothiazines Anaphylaxis and Hives  . Polysorbate Anaphylaxis  . Prednisone Shortness Of Breath  . Buspirone Itching  . Cardura [Doxazosin Mesylate] Itching  . Codeine Itching  . Prochlorperazine Other (See Comments)    unknown  .  Ranexa [Ranolazine]     Severe drop in BP  . Atorvastatin Hives    Cramping; tolerates Crestor ok  . Ofloxacin Rash  . Other Itching and Rash    "WOOL"= make skin look like it has been burned  . Penicillins Other (See Comments)    Causes redness all over. Has patient had a PCN reaction causing immediate rash, facial/tongue/throat swelling, SOB or lightheadedness with hypotension: No Has patient had a PCN reaction causing severe rash involving mucus membranes or skin necrosis: No Has patient had a PCN reaction that required hospitalization No Has patient had a PCN reaction occurring within the last 10 years: No If all of the above answers are "NO", then may proceed with Cephalosporin use.   . Pimozide Hives and Itching   Medications Prior to Admission  Medication Sig Dispense Refill  . acetaminophen (TYLENOL) 500 MG tablet Take 500 mg by mouth daily as needed for headache.     . ALPRAZolam (XANAX) 0.25 MG tablet Take 0.25 mg by mouth 2 (two) times daily as needed for anxiety.     [name] Kitchen aspirin  81 MG EC tablet Take 81 mg by mouth every morning.     . Carbamide Peroxide (EAR WAX DROPS OT) Place 4 drops in ear(s) 2 (two) times a week.  0  . carvedilol (COREG) 12.5 MG tablet TAKE ONE TABLET BY MOUTH TWICE DAILY. 60 tablet 6  . desonide (DESOWEN) 0.05 % cream Apply 1 application topically daily as needed (for skin irritation).    . DULoxetine (CYMBALTA) 60 MG capsule Take 60 mg by mouth at bedtime.     . fluticasone (FLONASE) 50 MCG/ACT nasal spray Place 2 sprays into both nostrils daily as needed for allergies.     . furosemide (LASIX) 20 MG tablet Take 1 tablet (20 mg total) by mouth daily. 90 tablet 3  . neomycin-bacitracin-polymyxin (NEOSPORIN) ointment Apply 1 application topically daily as needed for wound care (for blisters).    . pantoprazole (PROTONIX) 40 MG tablet Take 40 mg by mouth every morning.     . potassium chloride (K-DUR,KLOR-CON) 10 MEQ tablet TAKE TWO (2) TABLETS BY MOUTH DAILY. 60 tablet 6  . Probiotic Product (RESTORA) CAPS Take 1 capsule by mouth daily. 30 capsule 0  . loperamide (IMODIUM A-D) 2 MG tablet Take 2 mg by mouth 4 (four) times daily as needed for diarrhea or loose stools.    . nitroGLYCERIN (NITROSTAT) 0.4 MG SL tablet Place 0.4 mg under the tongue every 5 (five) minutes as needed for chest pain. Reported on 08/04/2015      Home: Home Living Family/patient expects to be discharged to:: Inpatient rehab Living Arrangements: Children Additional Comments: pt lives in 1 story home with ramp on back porch.   Functional History: Prior Function Level of Independence: Independent Comments: pt was amb without AD, bathing, dressing, cooking, cleaning and driving Functional Status:  Mobility: Bed Mobility Overal bed mobility: Needs Assistance Bed Mobility: Rolling, Sidelying to Sit Rolling: Min assist Sidelying to sit: Mod assist General bed mobility comments: max directional v/c's to adhere to back precautions, hob elevated, use of bed rail. assist for  trunk elevation, pt required increased time but was able to bring LEs off EOB with verbal and tactile cues Transfers Overall transfer level: Needs assistance Equipment used: Rolling walker (2 wheeled) Transfers: Sit to/from Stand, Stand Pivot Transfers Sit to Stand: Mod assist Stand pivot transfers: Mod assist General transfer comment: modA to achieve full upright standing, bilat LE weakness  with buckling in knees but able to catch self with modA and use of RW. v/c's for safe hand placement, assist at pelvis to provide proximal stability Ambulation/Gait General Gait Details: limited to std pvt to chair. pt with minimal ability to clear either foot, v/c's to increased UE WBing to off weight LEs, R LE buckling worse than L.    ADL:    Cognition: Cognition Overall Cognitive Status: Within Functional Limits for tasks assessed Orientation Level: Oriented X4 Cognition Arousal/Alertness: Awake/alert Behavior During Therapy: WFL for tasks assessed/performed Overall Cognitive Status: Within Functional Limits for tasks assessed  Blood pressure 132/56, pulse 74, temperature 97.7 F (36.5 C), temperature source Oral, resp. rate 20, height 5\' 1"  (1.549 m), weight 82.6 kg (182 lb 1.6 oz), SpO2 99 %. Physical Exam  HENT:  Head: Normocephalic.  Eyes: EOM are normal.  Neck: Normal range of motion. Neck supple. No thyromegaly present.  Cardiovascular: Normal rate and regular rhythm.   Respiratory: Effort normal and breath sounds normal. No respiratory distress.  GI: Bowel sounds are normal. She exhibits no distension.  Neurological: She is alert.  Sitting up in chair in no acute distress. Follows simple commands. Oriented to person place and time.  Skin:  Back brace in place surgical dressing intact    Results for orders placed or performed during the hospital encounter of 09/24/15 (from the past 24 hour(s))  Basic metabolic panel     Status: Abnormal   Collection Time: 10/06/15  5:00 AM    Result Value Ref Range   Sodium 138 135 - 145 mmol/L   Potassium 3.8 3.5 - 5.1 mmol/L   Chloride 97 (L) 101 - 111 mmol/L   CO2 31 22 - 32 mmol/L   Glucose, Bld 105 (H) 65 - 99 mg/dL   BUN 13 6 - 20 mg/dL   Creatinine, Ser 1.04 (H) 0.44 - 1.00 mg/dL   Calcium 9.3 8.9 - 10.3 mg/dL   GFR calc non Af Amer 49 (L) >60 mL/min   GFR calc Af Amer 57 (L) >60 mL/min   Anion gap 10 5 - 15  Magnesium     Status: None   Collection Time: 10/06/15  5:00 AM  Result Value Ref Range   Magnesium 1.9 1.7 - 2.4 mg/dL   No results found.  Assessment/Plan: Diagnosis: Lumbar spondylolisthesis and radiculopathy status post decompression and fusion with postoperative infection And deconditioning 1. Does the need for close, 24 hr/day medical supervision in concert with the patient's rehab needs make it unreasonable for this patient to be served in a less intensive setting? Yes 2. Co-Morbidities requiring supervision/potential complications: CSF leak, history of coronary artery disease, history of COPD 3. Due to bladder management, bowel management, safety, skin/wound care, disease management, medication administration, pain management and patient education, does the patient require 24 hr/day rehab nursing? Yes 4. Does the patient require coordinated care of a physician, rehab nurse, PT (1-2 hrs/day, 5 days/week), OT (1-2 hrs/day, 5 days/week) and SLP (0 hrs/day, 0 days/week) to address physical and functional deficits in the context of the above medical diagnosis(es)? Yes Addressing deficits in the following areas: balance, endurance, locomotion, strength, transferring, bowel/bladder control, bathing, dressing, feeding, grooming and toileting 5. Can the patient actively participate in an intensive therapy program of at least 3 hrs of therapy per day at least 5 days per week? Yes 6. The potential for patient to make measurable gains while on inpatient rehab is excellent 7. Anticipated functional outcomes upon  discharge from inpatient  rehab are modified independent  with PT, modified independent with OT, n/a with SLP. 8. Estimated rehab length of stay to reach the above functional goals is: 7-10 days  9. Does the patient have adequate social supports and living environment to accommodate these discharge functional goals? Yes 10. Anticipated D/C setting: Home 11. Anticipated post D/C treatments: [facility] therapy 12. Overall Rehab/Functional Prognosis: excellent  RECOMMENDATIONS: This patient's condition is appropriate for continued rehabilitative care in the following setting: CIR Patient has agreed to participate in recommended program. Yes Note that insurance prior authorization may be required for reimbursement for recommended care.  Comment:     10/06/2015

## 2015-10-06 NOTE — Progress Notes (Signed)
Inpatient Rehabilitation  Patient was screened by Haroon Shatto for appropriateness for an Inpatient Acute Rehab consult.  At this time, we are recommending Inpatient Rehab consult.  Please order consult if you are agreeable.  Tarig Zimmers PT Inpatient Rehab Admissions Coordinator Cell [phone] Office [phone]    

## 2015-10-06 NOTE — Progress Notes (Signed)
No acute events Moves legs well Back dry Stable Dispo planning

## 2015-10-06 NOTE — Progress Notes (Signed)
I will follow up with pt and family concerning rehab venue options tomorrow.  No bed available today for this pt. [phone]

## 2015-10-06 NOTE — Evaluation (Signed)
Physical Therapy Evaluation Patient Details Name: [name] MRN: [id number] DOB: Jun 20, 1932 Today's Date: 10/06/2015   History of Present Illness  patient seen initially in 2014 with lumbar pain with radiation to both legs with walking and burning sensation. She had decompression but lately the pain came back and radiological studies showed spondylolisthesis at l4-5 andl3-4. on 6/10 pt underwent decompression of L3-4 and L5 nerve roots  Clinical Impression  Patient is s/p above surgery resulting in the deficits listed below (see PT Problem List).Pt with prolonged bed rest from CSF and had 2 surgeries this admission. Pt with extremely weak LEs limiting ability to tolerate ambulation this date. Pt extremely motivated and desires to return to indep mobility. P Patient will benefit from skilled PT to increase their independence and safety with mobility (while adhering to their precautions) to allow discharge to the venue listed below. Pt an excellent candidate for CIR as she is motivated and has good home set up and support. P     Follow Up Recommendations CIR    Equipment Recommendations  Rolling walker with 5" wheels    Recommendations for Other Services Rehab consult     Precautions / Restrictions Precautions Precautions: Back Precaution Booklet Issued: Yes (comment) Precaution Comments: pt with poor recall Required Braces or Orthoses: Spinal Brace Spinal Brace: Applied in sitting position;Lumbar corset Restrictions Weight Bearing Restrictions: No      Mobility  Bed Mobility Overal bed mobility: Needs Assistance Bed Mobility: Rolling;Sidelying to Sit Rolling: Min assist Sidelying to sit: Mod assist       General bed mobility comments: max directional v/c's to adhere to back precautions, hob elevated, use of bed rail. assist for trunk elevation, pt required increased time but was able to bring LEs off EOB with verbal and tactile cues  Transfers Overall transfer level:  Needs assistance Equipment used: Rolling walker (2 wheeled) Transfers: Sit to/from [facility] Sit to Stand: Mod assist Stand pivot transfers: Mod assist       General transfer comment: modA to achieve full upright standing, bilat LE weakness with buckling in knees but able to catch self with modA and use of RW. v/c's for safe hand placement, assist at pelvis to provide proximal stability  Ambulation/Gait             General Gait Details: limited to std pvt to chair. pt with minimal ability to clear either foot, v/c's to increased UE WBing to off weight LEs, R LE buckling worse than L.  Stairs            Wheelchair Mobility    Modified Rankin (Stroke Patients Only)       Balance Overall balance assessment: Needs assistance Sitting-balance support: Feet supported;No upper extremity supported Sitting balance-Leahy Scale: Fair     Standing balance support: Bilateral upper extremity supported Standing balance-Leahy Scale: Zero Standing balance comment: requires RW and external support due to bilat LE weakness                             Pertinent Vitals/Pain Pain Assessment: 0-10 Pain Score: 5  Pain Location: bilat LE Pain Descriptors / Indicators: Pressure;Numbness Pain Intervention(s): Monitored during session    Home Living Family/patient expects to be discharged to:: Inpatient rehab Living Arrangements: Children               Additional Comments: pt lives in 1 story home with ramp on back porch.  Prior Function Level of Independence: Independent         Comments: pt was amb without AD, bathing, dressing, cooking, cleaning and driving     Hand Dominance   Dominant Hand: Right    Extremity/Trunk Assessment   Upper Extremity Assessment: Generalized weakness           Lower Extremity Assessment: RLE deficits/detail;LLE deficits/detail RLE Deficits / Details: 50% sensation deficit, grossly 2/5 LLE  Deficits / Details: 50% sensation deficit and grossly 3-/5  Cervical / Trunk Assessment: Other exceptions  Communication   Communication: No difficulties  Cognition Arousal/Alertness: Awake/alert Behavior During Therapy: WFL for tasks assessed/performed Overall Cognitive Status: Within Functional Limits for tasks assessed                      General Comments      Exercises        Assessment/Plan    PT Assessment Patient needs continued PT services  PT Diagnosis Difficulty walking;Generalized weakness   PT Problem List Decreased strength;Decreased range of motion;Decreased activity tolerance;Decreased balance;Decreased mobility  PT Treatment Interventions DME instruction;Gait training;Stair training;Functional mobility training;Therapeutic activities;Therapeutic exercise;Balance training;Patient/family education   PT Goals (Current goals can be found in the Care Plan section) Acute Rehab PT Goals Patient Stated Goal: to walk and be indep PT Goal Formulation: With patient Time For Goal Achievement: 10/20/15 Potential to Achieve Goals: Good    Frequency Min 5X/week   Barriers to discharge        Co-evaluation               End of Session Equipment Utilized During Treatment: Gait belt;Back brace Activity Tolerance: Patient tolerated treatment well Patient left: in chair;with call bell/phone within reach;with chair alarm set Nurse Communication: Mobility status         Time: [id number] PT Time Calculation (min) (ACUTE ONLY): 25 min   Charges:   PT Evaluation $PT Eval Moderate Complexity: 1 Procedure PT Treatments $Therapeutic Activity: 8-22 mins   PT G CodesKingsley Callander 10/06/2015, 10:12 AM   Kittie Plater, PT, DPT Pager #: [phone] Office #: [phone]

## 2015-10-06 NOTE — Progress Notes (Signed)
Patient: [name] / Admit Date: 09/24/2015 / Date of Encounter: 10/06/2015, 11:29 AM   Subjective: No CP or dyspnea  Objective: Telemetry: no data to review (patient transferred from 71M this AM) Physical Exam: Blood pressure 132/56, pulse 74, temperature 97.7 F (36.5 C), temperature source Oral, resp. rate 20, height 5\' 1"  (1.549 m), weight 182 lb 1.6 oz (82.6 kg), SpO2 99 %. General: Well developed, well nourished WF in no acute distress. Head: Normal Neck: supple Lungs: CTA Heart: RRR  Abdomen: Soft, non-tender, non-distended Extremities: No edema.  Neuro: Alert and oriented X 3. Moves all extremities spontaneously.    Intake/Output Summary (Last 24 hours) at 10/06/15 1129 Last data filed at 10/06/15 0154  Gross per 24 hour  Intake    940 ml  Output      0 ml  Net    940 ml    Inpatient Medications:  . amLODipine  5 mg Oral Daily  . aspirin EC  81 mg Oral q morning - 10a  . carvedilol  25 mg Oral BID  . DULoxetine  60 mg Oral QHS  . feeding supplement  1 Container Oral TID BM  . furosemide  20 mg Oral Daily  . heparin subcutaneous  5,000 Units Subcutaneous Q8H  . potassium chloride  20 mEq Oral Daily  . senna  1 tablet Oral BID  . sodium chloride flush  3 mL Intravenous Q12H  . vancomycin  1,000 mg Intravenous Q24H   Infusions:  . sodium chloride    . sodium chloride 75 mL/hr at 10/05/15 0800  . lactated ringers Stopped (09/24/15 1515)    Labs:  Recent Labs  10/04/15 0548 10/06/15 0500  NA 136 138  K 3.7 3.8  CL 97* 97*  CO2 32 31  GLUCOSE 100* 105*  BUN 10 13  CREATININE 0.84 1.04*  CALCIUM 8.5* 9.3  MG  --  1.9    Recent Labs  10/04/15 0548  WBC 7.6  NEUTROABS 4.5  HGB 8.8*  HCT 27.7*  MCV 92.0  PLT 351     Radiology/Studies:  Dg Lumbar Spine 2-3 Views  09/24/2015  CLINICAL DATA:  Intraoperative surgery. FLUOROSCOPY TIME:  42 seconds. Images: 3 EXAM: LUMBAR SPINE - 2-3 VIEW COMPARISON:  None. FINDINGS: By the end of the study,  pedicle screws have been placed at L3, L4, and L5. IMPRESSION: Pedicle screw placement as above. Electronically Signed   By: Dorise Bullion III M.D   On: 09/24/2015 14:58   Dg Lumbar Spine 2-3 Views  09/24/2015  CLINICAL DATA:  Surgeries at L3-4 and L4-5. EXAM: LUMBAR SPINE - 2-3 VIEW COMPARISON:  None. FINDINGS: Surgical instruments are seen posteriorly throughout the study, centered at L3-4 and L4-5. Multilevel degenerative changes. IMPRESSION: Intraoperative films as described above. Electronically Signed   By: Dorise Bullion III M.D   On: 09/24/2015 14:08   Dg C-arm 1-60 Min  09/24/2015  CLINICAL DATA:  Surgery.  Spondylolisthesis EXAM: DG C-ARM 61-120 MIN COMPARISON:  None. FINDINGS: Pedicle screws have been placed at L3, L4, and L5 by the end of the study. IMPRESSION: Pedicle screws been placed at L3, L4, L5. Electronically Signed   By: Dorise Bullion III M.D   On: 09/24/2015 14:52     Assessment and Plan  15F with CAD (DES to Cx 04/2013, moderately severe dz in non-dominant RCA), HTN, HLD (intolerant of statins), chronic diastolic CHF, severe panic attacks underwent decompression and fusion at L3-4, 4-5 on 09/23/15. She developed  chest pain while in the ICU recovering from surgery with monitoring showing NSVT, SVT, PACs. Troponin negative. 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  1. Chest pain with history of CAD - troponin neg. Per patient, not unusual for her to have pain at home. Continue ASA, BB, amlodipine. Plan nuclear study in AM.  2. SVT vs NSVT, PACs - Continue BB (increased this adm).  3. Chronic diastolic CHF - no significant volume overload. Continue oral home Lasix.   4. HTN - continue present meds  Signed, Kirk Ruths

## 2015-10-06 NOTE — Progress Notes (Signed)
I met with pt's daughter, Lattie Haw, at bedside to discuss rehab venue options. Pt currently sleeping. I explained that today I have no bed available and limited beds this week. Lattie Haw prefers inpt rehab admission, but aware that other options need to be explored if bed unavailable when pt medically ready for d/c. I discussed location of other inpt rehab facilities, but Lattie Haw prefers SNF close to [place] if [facility] inpt rehab bed unavailable. I have alerted SW. Marjorie Smolder. I will follow up tomorrow. [phone]

## 2015-10-07 ENCOUNTER — Inpatient Hospital Stay (HOSPITAL_COMMUNITY): Payer: Medicare Other

## 2015-10-07 DIAGNOSIS — D500 Iron deficiency anemia secondary to blood loss (chronic): Secondary | ICD-10-CM | POA: Insufficient documentation

## 2015-10-07 DIAGNOSIS — R079 Chest pain, unspecified: Secondary | ICD-10-CM

## 2015-10-07 LAB — NM MYOCAR MULTI W/SPECT W/WALL MOTION / EF
CHL CUP RESTING HR STRESS: 73 {beats}/min
Peak HR: 75 {beats}/min

## 2015-10-07 MED ORDER — REGADENOSON 0.4 MG/5ML IV SOLN
INTRAVENOUS | Status: AC
  Administered 2015-10-07: 0.4 mg via INTRAVENOUS
  Filled 2015-10-07: qty 5

## 2015-10-07 MED ORDER — OXYCODONE-ACETAMINOPHEN 5-325 MG PO TABS
ORAL_TABLET | ORAL | Status: AC
  Administered 2015-10-07: 1 via ORAL
  Filled 2015-10-07: qty 1

## 2015-10-07 MED ORDER — TECHNETIUM TC 99M TETROFOSMIN IV KIT
30.0000 | PACK | Freq: Once | INTRAVENOUS | Status: AC | PRN
  Administered 2015-10-07: 30 via INTRAVENOUS

## 2015-10-07 MED ORDER — REGADENOSON 0.4 MG/5ML IV SOLN
0.4000 mg | Freq: Once | INTRAVENOUS | Status: AC
  Administered 2015-10-07: 0.4 mg via INTRAVENOUS
  Filled 2015-10-07: qty 5

## 2015-10-07 MED ORDER — TECHNETIUM TC 99M TETROFOSMIN IV KIT
10.0000 | PACK | Freq: Once | INTRAVENOUS | Status: AC | PRN
  Administered 2015-10-07: 10 via INTRAVENOUS

## 2015-10-07 NOTE — Progress Notes (Signed)
Patient surgical dressing changed due to unattached edges. Light yellow/clear drainage on old dressing. RN will continue to monitor

## 2015-10-07 NOTE — Progress Notes (Signed)
No acute events Neuro stable Back dry Dispo planning

## 2015-10-07 NOTE — NC FL2 (Signed)
[place] LEVEL OF CARE SCREENING TOOL     IDENTIFICATION  Patient Name: [name] Birthdate: [birth year] Sex: female Admission Date (Current Location): 09/24/2015  [facility] and [place] Number:  [employer] and Address:  [facility], [place] [address] [place], [place] [address]      Provider Number: [id number]  Attending Physician Name and Address:  Leeroy Cha, MD  Relative Name and Phone Number:  Kanon Thrall. Phone [phone]    Current Level of Care: Hospital Recommended Level of Care: [place] Prior Approval Number:    Date Approved/Denied:   PASRR Number:  (Submitted for [facility] 6/22.  PASRR manual review)  Discharge Plan: SNF    Current Diagnoses: Patient Active Problem List   Diagnosis Date Noted  . Iron deficiency anemia due to chronic blood loss   . Meningitis 10/01/2015  . Ischemic chest pain ([place])   . Coronary artery disease due to lipid rich plaque   . NSVT (nonsustained ventricular tachycardia) ([place])   . PAF (paroxysmal atrial fibrillation) ([place])   . CSF leak 09/27/2015  . Spondylolisthesis of lumbar region 09/24/2015  . Gastric polyp   . History of colonic polyps   . Chronic diarrhea   . Diarrhea 04/01/2015  . Bowel habit changes 04/01/2015  . Esophageal dysphagia 04/01/2015  . PAOD (peripheral arterial occlusive disease) ([place]) 03/05/2015  . Pain in the chest   . Hyperlipidemia   . Weight gain 08/12/2014  . Normocytic anemia 08/07/2014  . Hemorrhoids 08/06/2014  . Diastolic CHF, acute on chronic (HCC) 07/30/2014  . Dyspnea on exertion   . CHF (congestive heart failure) ([place]) 07/29/2014  . Rectal bleeding 07/06/2014  . Constipation 07/06/2014  . Chest pain 11/24/2013  . Lower extremity edema 11/24/2013  . Acute kidney failure ([place]) 11/24/2013  . Hypokalemia 11/24/2013  . Other and unspecified angina pectoris 05/14/2013  . Hematochezia 02/13/2011  . Abdominal pain  02/13/2011  . Major depression ([place]) 09/28/2010  . FATIGUE 04/13/2009  . Chronic diastolic heart failure ([place]) 11/30/2008  . DYSPNEA 11/30/2008  . HYPERLIPIDEMIA-MIXED 11/27/2008  . CAD, NATIVE VESSEL - PCI + DES to left circumflex 05/14/13 11/27/2008  . Peripheral vascular disease ([place]) 11/27/2008  . PANIC ATTACK 02/28/2008  . MI 02/28/2008  . Internal hemorrhoids 02/28/2008  . GASTROESOPHAGEAL REFLUX DISEASE, CHRONIC 02/28/2008  . COLITIS 02/28/2008  . Dysphagia 02/28/2008  . History of cardiovascular disorder 02/28/2008    Orientation RESPIRATION BLADDER Height & Weight     Self, Time, Situation, Place  Normal Continent Weight: 182 lb 1.6 oz (82.6 kg) Height:  5\' 1"  (154.9 cm)  BEHAVIORAL SYMPTOMS/MOOD NEUROLOGICAL BOWEL NUTRITION STATUS      Continent Diet (Heart healthy)  AMBULATORY STATUS COMMUNICATION OF NEEDS Skin   Extensive Assist (Ambulation limited to stand'pivot to chair during 6/21 evaluation) Verbally Other (Comment) (Incision on back)                       Personal Care Assistance Level of Assistance  Bathing, Feeding, Dressing Bathing Assistance: Limited assistance Feeding assistance: Independent Dressing Assistance: Limited assistance     Functional Limitations Info  Sight, Hearing, Speech Sight Info: Adequate Hearing Info: Adequate Speech Info: Adequate    SPECIAL CARE FACTORS FREQUENCY  PT (By licensed PT)     PT Frequency: Evaluated 6/21 and a minimum of 5X per week therapy recommended  Contractures Contractures Info: Not present    Additional Factors Info  Code Status, Allergies Code Status Info: Full Allergies Info: Cephalosporins, Levaquin, Macrodantin, Phenothiazines, Polysorbate, Prednisone, Buspirone, Cardura, Codeine, Prochlorperazine, Ranexa, Atorvastatin. Wool, Penicillins, Dimozide           Current Medications (10/07/2015):  This is the current hospital active medication list Current Facility-Administered  Medications  Medication Dose Route Frequency Provider Last Rate Last Dose  . 0.9 %  sodium chloride infusion  250 mL Intravenous Continuous Leeroy Cha, MD      . 0.9 %  sodium chloride infusion   Intravenous Continuous Leeroy Cha, MD 75 mL/hr at 10/05/15 0800    . acetaminophen (TYLENOL) tablet 650 mg  650 mg Oral Q4H PRN Leeroy Cha, MD       Or  . acetaminophen (TYLENOL) suppository 650 mg  650 mg Rectal Q4H PRN Leeroy Cha, MD      . amLODipine (NORVASC) tablet 5 mg  5 mg Oral Daily Lelon Perla, MD   5 mg at 10/07/15 0914  . aspirin EC tablet 81 mg  81 mg Oral q morning - 10a Leeroy Cha, MD   81 mg at 10/07/15 0914  . carvedilol (COREG) tablet 25 mg  25 mg Oral BID Cheryln Manly, NP   25 mg at 10/07/15 0914  . diazepam (VALIUM) tablet 5 mg  5 mg Oral Q6H PRN Leeroy Cha, MD   5 mg at 10/06/15 2347  . DULoxetine (CYMBALTA) DR capsule 60 mg  60 mg Oral QHS Leeroy Cha, MD   60 mg at 10/06/15 2349  . feeding supplement (BOOST / RESOURCE BREEZE) liquid 1 Container  1 Container Oral TID BM Leeroy Cha, MD   1 Container at 10/07/15 1335  . fluticasone (FLONASE) 50 MCG/ACT nasal spray 2 spray  2 spray Each Nare Daily PRN Leeroy Cha, MD      . furosemide (LASIX) tablet 20 mg  20 mg Oral Daily Leeroy Cha, MD   20 mg at 10/07/15 1334  . heparin injection 5,000 Units  5,000 Units Subcutaneous Q8H Leeroy Cha, MD   5,000 Units at 10/07/15 0914  . lactated ringers infusion   Intravenous Continuous Lyndle Herrlich, MD   Stopped at 09/24/15 1515  . loperamide (IMODIUM) capsule 2 mg  2 mg Oral QID PRN Leeroy Cha, MD      . menthol-cetylpyridinium (CEPACOL) lozenge 3 mg  1 lozenge Oral PRN Leeroy Cha, MD       Or  . phenol (CHLORASEPTIC) mouth spray 1 spray  1 spray Mouth/Throat PRN Leeroy Cha, MD   1 spray at 09/25/15 1702  . morphine 2 MG/ML injection 1-4 mg  1-4 mg Intravenous Q3H PRN Leeroy Cha, MD   2 mg at 10/03/15 0604  . nitroGLYCERIN  (NITROSTAT) SL tablet 0.4 mg  0.4 mg Sublingual Q5 min PRN Leeroy Cha, MD   0.4 mg at 10/04/15 0519  . ondansetron (ZOFRAN) injection 4 mg  4 mg Intravenous Q4H PRN Leeroy Cha, MD      . oxyCODONE-acetaminophen (PERCOCET/ROXICET) 5-325 MG per tablet 1-2 tablet  1-2 tablet Oral Q4H PRN Leeroy Cha, MD   1 tablet at 10/07/15 1123  . potassium chloride SA (K-DUR,KLOR-CON) CR tablet 20 mEq  20 mEq Oral Daily Dayna N Dunn, PA-C   20 mEq at 10/07/15 0914  . senna (SENOKOT) tablet 8.6 mg  1 tablet Oral BID Leeroy Cha, MD   8.6 mg at 10/07/15 0914  . sodium chloride flush (  NS) 0.9 % injection 10-40 mL  10-40 mL Intracatheter PRN Leeroy Cha, MD      . sodium chloride flush (NS) 0.9 % injection 3 mL  3 mL Intravenous Q12H Leeroy Cha, MD   3 mL at 10/06/15 1034  . sodium chloride flush (NS) 0.9 % injection 3 mL  3 mL Intravenous PRN Leeroy Cha, MD      . vancomycin (VANCOCIN) IVPB 1000 mg/200 mL premix  1,000 mg Intravenous Q24H Cecilio Asper Batchelder, RPH   1,000 mg at 10/06/15 2350     Discharge Medications: Please see discharge summary for a list of discharge medications.  Relevant Imaging Results:  Relevant Lab Results:   Additional Information ss# [id number]  Sable Feil, LCSW

## 2015-10-07 NOTE — Progress Notes (Signed)
Inpt rehab bed is not available for this pt today. I have discussed with RN CM and SW. I will follow up tomorrow. [id number]

## 2015-10-07 NOTE — Evaluation (Signed)
Occupational Therapy Evaluation Patient Details Name: [name] MRN: [id number] DOB: 05-01-1932 Today's Date: 10/07/2015    History of Present Illness patient seen initially in 2014 with lumbar pain with radiation to both legs with walking and burning sensation. She had decompression but lately the pain came back and radiological studies showed spondylolisthesis at l4-5 andl3-4. on 6/10 pt underwent decompression of L3-4 and L5 nerve roots   Clinical Impression   Pt reports she was independent with ADLs PTA. Currently pt is overall min assist for functional mobility and ADLs. Pt able to verbally recall 3/3 back precautions at start of session with good demo of maintaining precautions throughout functional activities. Pt continues to report bil LE weakness with noted knee buckling during functional mobility. At this time recommending CIR level therapies for follow up to maximize independence and safety with ADLs and functional mobility prior to return home. Pt would benefit from continued skilled OT to address established goals.    Follow Up Recommendations  CIR;Supervision/Assistance - 24 hour    Equipment Recommendations  3 in 1 bedside comode    Recommendations for Other Services       Precautions / Restrictions Precautions Precautions: Back Precaution Comments: Pt able to verbally recall 3/3 back precautions at start of session Required Braces or Orthoses: Spinal Brace Spinal Brace: Applied in sitting position;Lumbar corset Restrictions Weight Bearing Restrictions: No      Mobility Bed Mobility Overal bed mobility: Needs Assistance Bed Mobility: Rolling;Sidelying to Sit;Sit to Sidelying Rolling: Supervision Sidelying to sit: Min guard     Sit to sidelying: Min assist General bed mobility comments: HOB flat with use of bed rail. Pt with good log roll technique throughout.  Transfers Overall transfer level: Needs assistance Equipment used: Rolling walker (2  wheeled) Transfers: Sit to/from Stand Sit to Stand: Min assist         General transfer comment: Min assist for balance in standing. Pt with bil LE weakness and knee buckling noted.    Balance Overall balance assessment: Needs assistance Sitting-balance support: Feet supported;No upper extremity supported Sitting balance-Leahy Scale: Good     Standing balance support: No upper extremity supported;During functional activity Standing balance-Leahy Scale: Fair Standing balance comment: Able to stand at sink and wash hands without UE support                            ADL Overall ADL's : Needs assistance/impaired Eating/Feeding: Independent;Sitting   Grooming: Min guard;Standing;Wash/dry hands   Upper Body Bathing: Set up;Min guard;Sitting   Lower Body Bathing: Minimal assistance;Sit to/from stand   Upper Body Dressing : Set up;Min guard;Sitting Upper Body Dressing Details (indicate cue type and reason): to don/doff back brace Lower Body Dressing: Minimal assistance;Sit to/from stand Lower Body Dressing Details (indicate cue type and reason): Pt able to pull up socks sitting EOB by bringing foot up to knee. Anticipate pt would have difficutly getting clothing items started over feet. Toilet Transfer: Minimal assistance;Ambulation;BSC;RW (BSC over toilet) Toilet Transfer Details (indicate cue type and reason): Pt min guard-min assist at times with LE weakness and knee buckling. Toileting- Clothing Manipulation and Hygiene: Minimal assistance;Sit to/from stand       Functional mobility during ADLs: Minimal assistance;Rolling walker General ADL Comments: Pt able to demo good technique with functional activities while maintaining back precautions.      Vision Vision Assessment?: No apparent visual deficits   Perception     Praxis  Pertinent Vitals/Pain Pain Assessment: 0-10 Pain Score: 3  Pain Location: back, L hip Pain Descriptors / Indicators:  Aching;Sore Pain Intervention(s): Monitored during session     Hand Dominance Right   Extremity/Trunk Assessment Upper Extremity Assessment Upper Extremity Assessment: Overall WFL for tasks assessed   Lower Extremity Assessment Lower Extremity Assessment: Defer to PT evaluation   Cervical / Trunk Assessment Cervical / Trunk Assessment: Other exceptions Cervical / Trunk Exceptions: s/p back sx   Communication Communication Communication: No difficulties   Cognition Arousal/Alertness: Awake/alert Behavior During Therapy: WFL for tasks assessed/performed Overall Cognitive Status: Within Functional Limits for tasks assessed                     General Comments       Exercises       Shoulder Instructions      Home Living Family/patient expects to be discharged to:: Inpatient rehab Living Arrangements: Children                               Additional Comments: Pt lives with her son in a 1 story home.      Prior Functioning/Environment Level of Independence: Independent        Comments: pt was amb without AD, bathing, dressing, cooking, cleaning and driving    OT Diagnosis: Generalized weakness;Acute pain   OT Problem List: Decreased strength;Decreased activity tolerance;Impaired balance (sitting and/or standing);Decreased knowledge of use of DME or AE;Decreased knowledge of precautions;Pain   OT Treatment/Interventions: Self-care/ADL training;Energy conservation;DME and/or AE instruction;Therapeutic activities;Patient/family education;Balance training    OT Goals(Current goals can be found in the care plan section) Acute Rehab OT Goals Patient Stated Goal: rehab then home OT Goal Formulation: With patient Time For Goal Achievement: 10/21/15 Potential to Achieve Goals: Good ADL Goals Pt Will Perform Grooming: with modified independence;standing Pt Will Perform Lower Body Bathing: with modified independence;sit to/from stand (with or without  AE) Pt Will Perform Lower Body Dressing: with modified independence;sit to/from stand (with or without AE) Pt Will Transfer to Toilet: with modified independence;ambulating;bedside commode Pt Will Perform Toileting - Clothing Manipulation and hygiene: with modified independence;sit to/from stand  OT Frequency: Min 2X/week   Barriers to D/C:            Co-evaluation              End of Session Equipment Utilized During Treatment: Gait belt;Rolling walker;Back brace  Activity Tolerance: Patient tolerated treatment well Patient left: in bed;with call bell/phone within reach;with bed alarm set   Time: [id number] OT Time Calculation (min): 19 min Charges:  OT General Charges $OT Visit: 1 Procedure OT Evaluation $OT Eval Moderate Complexity: 1 Procedure G-Codes:     Binnie Kand M.S., OTR/L Pager: [phone]  10/07/2015, 4:49 PM

## 2015-10-07 NOTE — Progress Notes (Signed)
Pharmacy Antibiotic Note  [name] is a 82 y.o. female s/p lumbar fusion 6/10 and insertion of lumbar.Abx for post-neurosurgical bacterial meningitis + surgical ppx with lumbar drain. Afebrile, WBC wnl.  Aim to remove drain today. EF preserved on ECHO.   Plan: Continue vancomycin 1g IV Q24 Monitor clinical picture, renal function, VT prn F/U C&S, abx deescalation / LOT  Height: 5\' 1"  (154.9 cm) Weight: 182 lb 1.6 oz (82.6 kg) IBW/kg (Calculated) : 47.8  Temp (24hrs), Avg:98.2 F (36.8 C), Min:97.6 F (36.4 C), Max:98.7 F (37.1 C)   Recent Labs Lab 09/30/15 1530 10/01/15 0427  10/01/15 1135 10/01/15 1840 10/02/15 0445 10/03/15 0420 10/04/15 0548 10/06/15 0500  WBC 3.9*  --   --  6.1  --  6.2 7.4 7.6  --   CREATININE  --   --   < >  --  0.96 0.79 0.82 0.84 1.04*  VANCOTROUGH  --  12  --   --   --   --   --  28*  --   < > = values in this interval not displayed.  Estimated Creatinine Clearance: 42 mL/min (by C-G formula based on Cr of 1.04).     Antimicrobials this admission: Vancomycin 6/12 >>  Cefepime 6/15 >>6/19  Dose Adjustments: 6/16 VT = 12 on 1g q24h 6/19 VT = 28 on 750mg  IV Q12 > Decreased dose back to 1g IV Q24  Levester Fresh, PharmD, BCPS, [facility] Clinical Pharmacist Pager [phone] 10/07/2015 10:52 AM

## 2015-10-07 NOTE — Progress Notes (Signed)
Nutrition Follow-up  DOCUMENTATION CODES:   Not applicable  INTERVENTION:  -Boost Breeze po TID, each supplement provides 250 kcal and 9 grams of protein  NUTRITION DIAGNOSIS:   Inadequate oral intake related to poor appetite as evidenced by per patient/family report. -resolving  GOAL:   Patient will meet greater than or equal to 90% of their needs -progressing  MONITOR:   PO intake, Supplement acceptance, I & O's, Labs, Weight trends  ASSESSMENT:   patient seen initially in 2014 with lumbar pain with radiation to both legs with walking and burning sensation. She had decompression but lately the pain came back and radiological studies showed spondylolisthesis at l4-5 andl3-4. No better with conservative treatment.   Ms. Neveu is improving. Appetite appears much better. Average PO intake 69% over last 8 meals. Currently NPO -> in nuclear medicine for stress test.  Expect appetite to improve as she improves -> prefers [facility] inpt rehab but limited beds available at this time.  Labs and medications reviewed: Senokot  Diet Order:  Diet NPO time specified Except for: Sips with Meds  Skin:  Wound (see comment) (Incsions to back)  Last BM:  6/8  Height:   Ht Readings from Last 1 Encounters:  09/28/15 5\' 1"  (1.549 m)    Weight:   Wt Readings from Last 1 Encounters:  09/28/15 182 lb 1.6 oz (82.6 kg)    Ideal Body Weight:  47.72 kg  BMI:  Body mass index is 34.43 kg/(m^2).  Estimated Nutritional Needs:   Kcal:  1400-1700  Protein:  50-60  Fluid:  >/= 1.4 L  EDUCATION NEEDS:   No education needs identified at this time  Satira Anis. Tenise Stetler, MS, RD LDN Inpatient Clinical Dietitian Pager [phone]

## 2015-10-07 NOTE — Care Management Note (Signed)
Case Management Note  Patient Details  Name: BETZABE LIAS MRN: [id number] Date of Birth: [birth year]  Subjective/Objective:                    Action/Plan:   Expected Discharge Date:                  Expected Discharge Plan:  [place]  In-House Referral:     Discharge planning Services  CM Consult  Post Acute Care Choice:    Choice offered to:  Patient, Adult Children  DME Arranged:  IV pump/equipment DME Agency:  [place] Arranged:  RN, PT [facility] Agency:  [place]  Status of Service:  Completed, signed off  If discussed at [place] Length of Stay Meetings, dates discussed:  10/07/2015  Additional Comments:  Delrae Sawyers, RN 10/07/2015, 9:08 AM

## 2015-10-07 NOTE — Clinical Social Work Note (Signed)
Clinical Social Worker has completed psychosocial assessment. Full psychosocial assessment to follow.   Glendon Axe, MSW, LCSWA [phone] 10/07/2015 11:54 AM

## 2015-10-07 NOTE — Progress Notes (Signed)
OT Cancellation Note  Patient Details Name: [name] MRN: [id number] DOB: [birth year]   Cancelled Treatment:    Reason Eval/Treat Not Completed: Patient at procedure or test/ unavailable. Will follow up for OT eval as time allows.  Binnie Kand M.S., OTR/L Pager: [phone]  10/07/2015, 11:16 AM

## 2015-10-07 NOTE — Progress Notes (Signed)
Patient Name: [name] Date of Encounter: 10/07/2015     Active Problems:   Spondylolisthesis of lumbar region   CSF leak   Meningitis   Ischemic chest pain (HCC)   Coronary artery disease due to lipid rich plaque   NSVT (nonsustained ventricular tachycardia) (HCC)   PAF (paroxysmal atrial fibrillation) (HCC)   Iron deficiency anemia due to chronic blood loss    SUBJECTIVE  Seen in nuc med for stress test. No more chest pain. Just some back and hip pain  CURRENT MEDS . amLODipine  5 mg Oral Daily  . aspirin EC  81 mg Oral q morning - 10a  . carvedilol  25 mg Oral BID  . DULoxetine  60 mg Oral QHS  . feeding supplement  1 Container Oral TID BM  . furosemide  20 mg Oral Daily  . heparin subcutaneous  5,000 Units Subcutaneous Q8H  . potassium chloride  20 mEq Oral Daily  . regadenoson      . regadenoson  0.4 mg Intravenous Once  . senna  1 tablet Oral BID  . sodium chloride flush  3 mL Intravenous Q12H  . vancomycin  1,000 mg Intravenous Q24H    OBJECTIVE  Filed Vitals:   10/06/15 1719 10/06/15 2210 10/07/15 0123 10/07/15 1100  BP: 132/55 165/63 146/48 124/61  Pulse: 73 70 72   Temp: 98.7 F (37.1 C) 97.9 F (36.6 C) 98.2 F (36.8 C)   TempSrc: Oral Oral Oral   Resp: 18 18 18    Height:      Weight:      SpO2: 95% 98% 95%    No intake or output data in the 24 hours ending 10/07/15 1129 Filed Weights   09/24/15 0747 09/28/15 0000  Weight: 179 lb 3 oz (81.279 kg) 182 lb 1.6 oz (82.6 kg)    PHYSICAL EXAM  General: Well developed, well nourished WF in no acute distress. Head: Normal Neck: supple Lungs: CTA Heart: RRR  Abdomen: Soft, non-tender, non-distended Extremities: No edema.  Neuro: Alert and oriented X 3. Moves all extremities spontaneously.  Accessory Clinical Findings  CBC No results for input(s): WBC, NEUTROABS, HGB, HCT, MCV, PLT in the last 72 hours. Basic Metabolic Panel  Recent Labs  10/06/15 0500  NA 138  K 3.8  CL 97*    CO2 31  GLUCOSE 105*  BUN 13  CREATININE 1.04*  CALCIUM 9.3  MG 1.9   Liver Function Tests No results for input(s): AST, ALT, ALKPHOS, BILITOT, PROT, ALBUMIN in the last 72 hours. No results for input(s): LIPASE, AMYLASE in the last 72 hours. Cardiac Enzymes No results for input(s): CKTOTAL, CKMB, CKMBINDEX, TROPONINI in the last 72 hours. BNP Invalid input(s): POCBNP D-Dimer No results for input(s): DDIMER in the last 72 hours. Hemoglobin A1C No results for input(s): HGBA1C in the last 72 hours. Fasting Lipid Panel No results for input(s): CHOL, HDL, LDLCALC, TRIG, CHOLHDL, LDLDIRECT in the last 72 hours. Thyroid Function Tests No results for input(s): TSH, T4TOTAL, T3FREE, THYROIDAB in the last 72 hours.  Invalid input(s): FREET3  TELE  NSR in nuc med.  Radiology/Studies  Dg Lumbar Spine 2-3 Views  09/24/2015  CLINICAL DATA:  Intraoperative surgery. FLUOROSCOPY TIME:  42 seconds. Images: 3 EXAM: LUMBAR SPINE - 2-3 VIEW COMPARISON:  None. FINDINGS: By the end of the study, pedicle screws have been placed at L3, L4, and L5. IMPRESSION: Pedicle screw placement as above. Electronically Signed   By: Dorise Bullion III  M.D   On: 09/24/2015 14:58   Dg Lumbar Spine 2-3 Views  09/24/2015  CLINICAL DATA:  Surgeries at L3-4 and L4-5. EXAM: LUMBAR SPINE - 2-3 VIEW COMPARISON:  None. FINDINGS: Surgical instruments are seen posteriorly throughout the study, centered at L3-4 and L4-5. Multilevel degenerative changes. IMPRESSION: Intraoperative films as described above. Electronically Signed   By: Dorise Bullion III M.D   On: 09/24/2015 14:08   Dg C-arm 1-60 Min  09/24/2015  CLINICAL DATA:  Surgery.  Spondylolisthesis EXAM: DG C-ARM 61-120 MIN COMPARISON:  None. FINDINGS: Pedicle screws have been placed at L3, L4, and L5 by the end of the study. IMPRESSION: Pedicle screws been placed at L3, L4, L5. Electronically Signed   By: Dorise Bullion III M.D   On: 09/24/2015 14:52    ASSESSMENT  AND PLAN  77F with CAD (DES to Cx 04/2013, moderately severe dz in non-dominant RCA), HTN, HLD (intolerant of statins), chronic diastolic CHF, severe panic attacks underwent decompression and fusion at L3-4, 4-5 on 09/23/15. She developed chest pain while in the ICU recovering from surgery with monitoring showing NSVT, SVT, PACs. Troponin negative. 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  1. Chest pain with history of CAD - troponin neg. Per patient, not unusual for her to have pain at home. Continue ASA, BB, amlodipine. Nuc done today. Await nuclear images.   2. SVT vs NSVT, PACs - Continue BB, increased yesterday  3. Chronic diastolic CHF - no significant volume overload. Continue oral home Lasix.   4. HTN - continue present meds  Signed, Angelena Form PA-C  Pager [phone]  As above; patient seen and examined; no chest pain; await results of nuclear study; if normal or low risk, will plan medical therapy. Kirk Ruths

## 2015-10-07 NOTE — Progress Notes (Signed)
Physical Therapy Treatment Patient Details Name: [name] MRN: [id number] DOB: Dec 03, 1932 Today's Date: 10/07/2015    History of Present Illness patient seen initially in 2014 with lumbar pain with radiation to both legs with walking and burning sensation. She had decompression but lately the pain came back and radiological studies showed spondylolisthesis at l4-5 andl3-4. on 6/10 pt underwent decompression of L3-4 and L5 nerve roots    PT Comments    Progressing steadily, much better than yesterday's session.  Follow Up Recommendations  CIR     Equipment Recommendations  Rolling walker with 5" wheels    Recommendations for Other Services       Precautions / Restrictions Precautions Precautions: Back Precaution Booklet Issued: Yes (comment) Precaution Comments: Pt able to verbally recall 3/3 back precautions at start of session Required Braces or Orthoses: Spinal Brace Spinal Brace: Applied in sitting position;Lumbar corset Restrictions Weight Bearing Restrictions: No    Mobility  Bed Mobility Overal bed mobility: Needs Assistance Bed Mobility: Rolling;Sidelying to Sit;Sit to Sidelying Rolling: Supervision Sidelying to sit: Min guard     Sit to sidelying: Min assist General bed mobility comments: HOB flat with use of bed rail. Pt with good log roll technique throughout.  Transfers Overall transfer level: Needs assistance Equipment used: Rolling walker (2 wheeled) Transfers: Sit to/from Stand Sit to Stand: Min assist (bed or toilet)         General transfer comment: steady assist and cues for better hand placement  Ambulation/Gait Ambulation/Gait assistance: Min assist Ambulation Distance (Feet): 20 Feet (then 10 feet from bathroom) Assistive device: Rolling walker (2 wheeled) Gait Pattern/deviations: Step-through pattern   Gait velocity interpretation: Below normal speed for age/gender General Gait Details: visible weak L knee with wobble, but no  buckling.   Stairs            Wheelchair Mobility    Modified Rankin (Stroke Patients Only)       Balance Overall balance assessment: Needs assistance Sitting-balance support: Feet supported Sitting balance-Leahy Scale: Good     Standing balance support: No upper extremity supported;During functional activity Standing balance-Leahy Scale: Fair Standing balance comment: Able to stand at sink and wash hands without UE support                    Cognition Arousal/Alertness: Awake/alert Behavior During Therapy: WFL for tasks assessed/performed Overall Cognitive Status: Within Functional Limits for tasks assessed                      Exercises      General Comments General comments (skin integrity, edema, etc.): reinforced back care/prec, bracing issues, progression of activity      Pertinent Vitals/Pain Pain Assessment: 0-10 Pain Score: 3  Pain Location: l hip Pain Descriptors / Indicators: Aching;Sore Pain Intervention(s): Monitored during session    Home Living Family/patient expects to be discharged to:: Inpatient rehab Living Arrangements: Children             Additional Comments: Pt lives with her son in a 1 story home.    Prior Function Level of Independence: Independent      Comments: pt was amb without AD, bathing, dressing, cooking, cleaning and driving   PT Goals (current goals can now be found in the care plan section) Acute Rehab PT Goals Patient Stated Goal: rehab then home PT Goal Formulation: With patient Time For Goal Achievement: 10/20/15 Potential to Achieve Goals: Good Progress towards PT goals: Progressing toward  goals    Frequency  Min 5X/week    PT Plan Current plan remains appropriate    Co-evaluation             End of Session Equipment Utilized During Treatment: Back brace Activity Tolerance: Patient tolerated treatment well Patient left: in bed;with call bell/phone within reach (EOB finishing  dinner)     Time: [id number] PT Time Calculation (min) (ACUTE ONLY): 23 min  Charges:  $Gait Training: 8-22 mins $Therapeutic Activity: 8-22 mins                    G Codes:      Monroe Toure, Tessie Fass 10/07/2015, 5:39 PM 10/07/2015  Donnella Sham, PT [phone]  (pager)

## 2015-10-08 ENCOUNTER — Inpatient Hospital Stay
Admission: RE | Admit: 2015-10-08 | Discharge: 2015-10-15 | Disposition: A | Discharge status: Nursing Home (Includes ICF) | Payer: Medicare Other | Source: Ambulatory Visit | Attending: Internal Medicine | Admitting: Internal Medicine

## 2015-10-08 DIAGNOSIS — Z189 Retained foreign body fragments, unspecified material: Secondary | ICD-10-CM | POA: Diagnosis not present

## 2015-10-08 DIAGNOSIS — N179 Acute kidney failure, unspecified: Secondary | ICD-10-CM | POA: Diagnosis not present

## 2015-10-08 DIAGNOSIS — I480 Paroxysmal atrial fibrillation: Secondary | ICD-10-CM | POA: Diagnosis not present

## 2015-10-08 DIAGNOSIS — Z23 Encounter for immunization: Secondary | ICD-10-CM | POA: Diagnosis not present

## 2015-10-08 DIAGNOSIS — M545 Low back pain: Secondary | ICD-10-CM | POA: Diagnosis not present

## 2015-10-08 DIAGNOSIS — R072 Precordial pain: Secondary | ICD-10-CM | POA: Diagnosis not present

## 2015-10-08 DIAGNOSIS — K219 Gastro-esophageal reflux disease without esophagitis: Secondary | ICD-10-CM | POA: Diagnosis not present

## 2015-10-08 DIAGNOSIS — G960 Cerebrospinal fluid leak: Secondary | ICD-10-CM | POA: Diagnosis not present

## 2015-10-08 DIAGNOSIS — R293 Abnormal posture: Secondary | ICD-10-CM | POA: Diagnosis not present

## 2015-10-08 DIAGNOSIS — M5432 Sciatica, left side: Secondary | ICD-10-CM | POA: Diagnosis not present

## 2015-10-08 DIAGNOSIS — D500 Iron deficiency anemia secondary to blood loss (chronic): Secondary | ICD-10-CM | POA: Diagnosis not present

## 2015-10-08 DIAGNOSIS — G030 Nonpyogenic meningitis: Secondary | ICD-10-CM | POA: Diagnosis not present

## 2015-10-08 DIAGNOSIS — I509 Heart failure, unspecified: Secondary | ICD-10-CM | POA: Diagnosis not present

## 2015-10-08 DIAGNOSIS — R488 Other symbolic dysfunctions: Secondary | ICD-10-CM | POA: Diagnosis not present

## 2015-10-08 DIAGNOSIS — M1990 Unspecified osteoarthritis, unspecified site: Secondary | ICD-10-CM | POA: Diagnosis not present

## 2015-10-08 DIAGNOSIS — M25552 Pain in left hip: Secondary | ICD-10-CM | POA: Diagnosis not present

## 2015-10-08 DIAGNOSIS — R52 Pain, unspecified: Secondary | ICD-10-CM | POA: Diagnosis not present

## 2015-10-08 DIAGNOSIS — R079 Chest pain, unspecified: Secondary | ICD-10-CM | POA: Diagnosis not present

## 2015-10-08 DIAGNOSIS — T8189XA Other complications of procedures, not elsewhere classified, initial encounter: Secondary | ICD-10-CM | POA: Diagnosis not present

## 2015-10-08 DIAGNOSIS — G039 Meningitis, unspecified: Secondary | ICD-10-CM | POA: Diagnosis not present

## 2015-10-08 DIAGNOSIS — Z79899 Other long term (current) drug therapy: Secondary | ICD-10-CM | POA: Diagnosis not present

## 2015-10-08 DIAGNOSIS — G9782 Other postprocedural complications and disorders of nervous system: Secondary | ICD-10-CM | POA: Diagnosis not present

## 2015-10-08 DIAGNOSIS — Z7982 Long term (current) use of aspirin: Secondary | ICD-10-CM | POA: Diagnosis not present

## 2015-10-08 DIAGNOSIS — R262 Difficulty in walking, not elsewhere classified: Secondary | ICD-10-CM | POA: Diagnosis not present

## 2015-10-08 DIAGNOSIS — I252 Old myocardial infarction: Secondary | ICD-10-CM | POA: Diagnosis not present

## 2015-10-08 DIAGNOSIS — Z87891 Personal history of nicotine dependence: Secondary | ICD-10-CM | POA: Diagnosis not present

## 2015-10-08 DIAGNOSIS — R0602 Shortness of breath: Secondary | ICD-10-CM | POA: Diagnosis not present

## 2015-10-08 DIAGNOSIS — I739 Peripheral vascular disease, unspecified: Secondary | ICD-10-CM | POA: Diagnosis not present

## 2015-10-08 DIAGNOSIS — Z48811 Encounter for surgical aftercare following surgery on the nervous system: Secondary | ICD-10-CM | POA: Diagnosis not present

## 2015-10-08 DIAGNOSIS — J449 Chronic obstructive pulmonary disease, unspecified: Secondary | ICD-10-CM | POA: Diagnosis not present

## 2015-10-08 DIAGNOSIS — F410 Panic disorder [episodic paroxysmal anxiety] without agoraphobia: Secondary | ICD-10-CM | POA: Diagnosis not present

## 2015-10-08 DIAGNOSIS — M5489 Other dorsalgia: Secondary | ICD-10-CM | POA: Diagnosis not present

## 2015-10-08 DIAGNOSIS — I5032 Chronic diastolic (congestive) heart failure: Secondary | ICD-10-CM | POA: Diagnosis not present

## 2015-10-08 DIAGNOSIS — F411 Generalized anxiety disorder: Secondary | ICD-10-CM | POA: Diagnosis not present

## 2015-10-08 DIAGNOSIS — F329 Major depressive disorder, single episode, unspecified: Secondary | ICD-10-CM | POA: Diagnosis not present

## 2015-10-08 DIAGNOSIS — E785 Hyperlipidemia, unspecified: Secondary | ICD-10-CM | POA: Diagnosis not present

## 2015-10-08 DIAGNOSIS — R0789 Other chest pain: Secondary | ICD-10-CM | POA: Diagnosis not present

## 2015-10-08 DIAGNOSIS — Z96651 Presence of right artificial knee joint: Secondary | ICD-10-CM | POA: Diagnosis not present

## 2015-10-08 DIAGNOSIS — R279 Unspecified lack of coordination: Secondary | ICD-10-CM | POA: Diagnosis not present

## 2015-10-08 DIAGNOSIS — M6281 Muscle weakness (generalized): Secondary | ICD-10-CM | POA: Diagnosis not present

## 2015-10-08 DIAGNOSIS — I2510 Atherosclerotic heart disease of native coronary artery without angina pectoris: Secondary | ICD-10-CM | POA: Diagnosis not present

## 2015-10-08 DIAGNOSIS — I110 Hypertensive heart disease with heart failure: Secondary | ICD-10-CM | POA: Diagnosis not present

## 2015-10-08 DIAGNOSIS — M4316 Spondylolisthesis, lumbar region: Secondary | ICD-10-CM | POA: Diagnosis not present

## 2015-10-08 DIAGNOSIS — I25119 Atherosclerotic heart disease of native coronary artery with unspecified angina pectoris: Secondary | ICD-10-CM | POA: Diagnosis not present

## 2015-10-08 DIAGNOSIS — I209 Angina pectoris, unspecified: Secondary | ICD-10-CM | POA: Diagnosis not present

## 2015-10-08 MED ORDER — VANCOMYCIN HCL IN DEXTROSE 1-5 GM/200ML-% IV SOLN: 1000.0000 mg | INTRAVENOUS | Status: DC

## 2015-10-08 MED ORDER — CARVEDILOL 12.5 MG PO TABS: 25.0000 mg | ORAL_TABLET | Freq: Two times a day (BID) | ORAL | Status: DC

## 2015-10-08 MED ORDER — GABAPENTIN 300 MG PO CAPS: 300.0000 mg | ORAL_CAPSULE | Freq: Three times a day (TID) | ORAL | Status: DC

## 2015-10-08 MED ORDER — HEPARIN SOD (PORK) LOCK FLUSH 100 UNIT/ML IV SOLN
250.0000 [IU] | INTRAVENOUS | Status: DC | PRN
  Administered 2015-10-08: 250 [IU]
  Filled 2015-10-08 (×2): qty 3

## 2015-10-08 MED ORDER — OXYCODONE-ACETAMINOPHEN 5-325 MG PO TABS: 1.0000 | ORAL_TABLET | ORAL | Status: DC | PRN

## 2015-10-08 MED ORDER — HEPARIN SOD (PORK) LOCK FLUSH 100 UNIT/ML IV SOLN
250.0000 [IU] | Freq: Every day | INTRAVENOUS | Status: DC
  Filled 2015-10-08: qty 2.5

## 2015-10-08 NOTE — Progress Notes (Signed)
Report given to Santiago Glad at [facility]. All belongings packed and awaiting for transportation.   Ave Filter, RN

## 2015-10-08 NOTE — Discharge Summary (Signed)
Date of Admission: 09/24/2015  Date of Discharge: 10/08/2015  Admission Diagnosis: Post-operative CSF leak  Discharge Diagnosis: Same, meningitis  Procedure Performed: Repair of  CSF leak and lumbar drain placement  Attending: Leeroy Cha, MD  Hospital Course:  The patient was admitted for the above listed operation and had an uncomplicated post-operative course.  They were discharged in stable condition.  Follow up: 3 weeks    Medication List    TAKE these medications        acetaminophen 500 MG tablet  Commonly known as:  TYLENOL  Take 500 mg by mouth daily as needed for headache.     ALPRAZolam 0.25 MG tablet  Commonly known as:  XANAX  Take 0.25 mg by mouth 2 (two) times daily as needed for anxiety.     aspirin 81 MG EC tablet  Take 81 mg by mouth every morning.     carvedilol 12.5 MG tablet  Commonly known as:  COREG  Take 2 tablets (25 mg total) by mouth 2 (two) times daily.     desonide 0.05 % cream  Commonly known as:  DESOWEN  Apply 1 application topically daily as needed (for skin irritation).     DULoxetine 60 MG capsule  Commonly known as:  CYMBALTA  Take 60 mg by mouth at bedtime.     EAR WAX DROPS OT  Place 4 drops in ear(s) 2 (two) times a week.     fluticasone 50 MCG/ACT nasal spray  Commonly known as:  FLONASE  Place 2 sprays into both nostrils daily as needed for allergies.     furosemide 20 MG tablet  Commonly known as:  LASIX  Take 1 tablet (20 mg total) by mouth daily.     gabapentin 300 MG capsule  Commonly known as:  NEURONTIN  Take 1 capsule (300 mg total) by mouth 3 (three) times daily.     IMODIUM A-D 2 MG tablet  Generic drug:  loperamide  Take 2 mg by mouth 4 (four) times daily as needed for diarrhea or loose stools.     neomycin-bacitracin-polymyxin ointment  Commonly known as:  NEOSPORIN  Apply 1 application topically daily as needed for wound care (for blisters).     nitroGLYCERIN 0.4 MG SL tablet  Commonly known as:   NITROSTAT  Place 0.4 mg under the tongue every 5 (five) minutes as needed for chest pain. Reported on 08/04/2015     oxyCODONE-acetaminophen 5-325 MG tablet  Commonly known as:  PERCOCET/ROXICET  Take 1-2 tablets by mouth every 4 (four) hours as needed for moderate pain.     pantoprazole 40 MG tablet  Commonly known as:  PROTONIX  Take 40 mg by mouth every morning.     potassium chloride 10 MEQ tablet  Commonly known as:  K-DUR,KLOR-CON  TAKE TWO (2) TABLETS BY MOUTH DAILY.     RESTORA Caps  Take 1 capsule by mouth daily.     vancomycin 1-5 GM/200ML-% Soln  Commonly known as:  VANCOCIN  Inject 200 mLs (1,000 mg total) into the vein daily.

## 2015-10-08 NOTE — Clinical Social Work Placement (Signed)
   CLINICAL SOCIAL WORK PLACEMENT  NOTE  Date:  10/08/2015  Patient Details  Name: [name] MRN: [id number] Date of Birth: 1932-09-11  Clinical Social Work is seeking post-discharge placement for this patient at the [place] level of care (*CSW will initial, date and re-position this form in  chart as items are completed):  Yes   Patient/family provided with [place] Work Department's list of facilities offering this level of care within the geographic area requested by the patient (or if unable, by the patient's family).  Yes   Patient/family informed of their freedom to choose among providers that offer the needed level of care, that participate in Medicare, Medicaid or managed care program needed by the patient, have an available bed and are willing to accept the patient.  Yes   Patient/family informed of [facility]'s ownership interest in [facility] and [facility], as well as of the fact that they are under no obligation to receive care at these facilities.  PASRR submitted to EDS on 10/07/15     PASRR number received on 10/07/15     Existing PASRR number confirmed on       FL2 transmitted to all facilities in geographic area requested by pt/family on 10/07/15     FL2 transmitted to all facilities within larger geographic area on       Patient informed that his/her managed care company has contracts with or will negotiate with certain facilities, including the following:        Yes   Patient/family informed of bed offers received.  Patient chooses bed at [facility]     Physician recommends and patient chooses bed at      Patient to be transferred to [facility] on 10/08/15.  Patient to be transferred to facility by [facility]     Patient family notified on 10/08/15 of transfer.  Name of family member notified:  [name] daughter     PHYSICIAN       Additional Comment:     _______________________________________________ Ross Ludwig, LCSWA 10/08/2015, 1:36 PM

## 2015-10-08 NOTE — Progress Notes (Signed)
No acute events Unchanged Dispo planning

## 2015-10-08 NOTE — Care Management Important Message (Signed)
Important Message  Patient Details  Name: [name] MRN: [id number] Date of Birth: Jul 23, 1932   Medicare Important Message Given:  Yes    Loann Quill 10/08/2015, 9:51 AM

## 2015-10-08 NOTE — Clinical Social Work Note (Signed)
CSW presented bed offers to patient's daughter and she chose [facility] [place] contacted [facility] and they said they can take patient today.  CSW updated patient's daughter and SNF, patient's [facility] number is [phone].  Patient to be d/c'ed today to [facility].  Patient and family agreeable to plans will transport via ems RN to call report to [phone].  Evette Cristal, MSW, [place]

## 2015-10-08 NOTE — Progress Notes (Signed)
Patient Name: [name] Date of Encounter: 10/08/2015  Hospital Problem List     Active Problems:   Spondylolisthesis of lumbar region   CSF leak   Meningitis   Ischemic chest pain (HCC)   Coronary artery disease due to lipid rich plaque   NSVT (nonsustained ventricular tachycardia) (HCC)   PAF (paroxysmal atrial fibrillation) (HCC)   Iron deficiency anemia due to chronic blood loss    Subjective   Feeling well this morning. No more chest pain.  Inpatient Medications    . amLODipine  5 mg Oral Daily  . aspirin EC  81 mg Oral q morning - 10a  . carvedilol  25 mg Oral BID  . DULoxetine  60 mg Oral QHS  . feeding supplement  1 Container Oral TID BM  . furosemide  20 mg Oral Daily  . heparin subcutaneous  5,000 Units Subcutaneous Q8H  . potassium chloride  20 mEq Oral Daily  . senna  1 tablet Oral BID  . sodium chloride flush  3 mL Intravenous Q12H  . vancomycin  1,000 mg Intravenous Q24H    Vital Signs    Filed Vitals:   10/07/15 1820 10/07/15 2128 10/08/15 0125 10/08/15 0425  BP: 136/51 136/47 132/56 112/41  Pulse: 72 83 74 78  Temp: 98.5 F (36.9 C) 98.6 F (37 C) 99.1 F (37.3 C) 99 F (37.2 C)  TempSrc: Oral Oral Oral Oral  Resp: 17 16 18 16   Height:      Weight:      SpO2: 98% 98% 98% 93%    Intake/Output Summary (Last 24 hours) at 10/08/15 0829 Last data filed at 10/08/15 0813  Gross per 24 hour  Intake    363 ml  Output      0 ml  Net    363 ml   Filed Weights   09/24/15 0747 09/28/15 0000  Weight: 179 lb 3 oz (81.279 kg) 182 lb 1.6 oz (82.6 kg)    Physical Exam    General: Pleasant, NAD. Neuro: Alert and oriented X 3. Moves all extremities spontaneously. Psych: Normal affect. HEENT:  Normal  Neck: Supple without bruits or JVD. Lungs:  Resp regular and unlabored, CTA. Heart: RRR no s3, s4, or murmurs. Abdomen: Soft, non-tender, non-distended, BS + x 4.  Extremities: No clubbing, cyanosis or edema. DP/PT/Radials 2+ and equal  bilaterally.  Labs   Basic Metabolic Panel  Recent Labs  10/06/15 0500  NA 138  K 3.8  CL 97*  CO2 31  GLUCOSE 105*  BUN 13  CREATININE 1.04*  CALCIUM 9.3  MG 1.9    Telemetry    Not on tele  ECG    No recent  Radiology     Nm Myocar Multi W/spect W/wall Motion / Ef  10/07/2015  CLINICAL DATA:  Chest pain. History of cardiac murmur, hypertension and shortness of breath. Recent lumbar fusion. EXAM: MYOCARDIAL IMAGING WITH SPECT (REST AND PHARMACOLOGIC-STRESS) GATED LEFT VENTRICULAR WALL MOTION STUDY LEFT VENTRICULAR EJECTION FRACTION TECHNIQUE: Standard myocardial SPECT imaging was performed after resting intravenous injection of 10 mCi Tc-26m tetrofosmin. Subsequently, intravenous infusion of Lexiscan was performed under the supervision of the Cardiology staff. At peak effect of the drug, 30 mCi Tc-59m tetrofosmin was injected intravenously and standard myocardial SPECT imaging was performed. Quantitative gated imaging was also performed to evaluate left ventricular wall motion, and estimate left ventricular ejection fraction. COMPARISON:  Chest CT 07/29/2014. Nuclear medicine myocardial scan 11/25/2013. FINDINGS: Perfusion: There is mildly  decreased activity at the apex and within the septum which appears fixed. There is no associated wall motion abnormality, and this attributed to thinning. No reversible perfusion defects. Wall Motion: Normal left ventricular wall motion. No left ventricular dilation. Left Ventricular Ejection Fraction: 66 % End diastolic volume 46 ml End systolic volume 16 ml IMPRESSION: 1. No reversible ischemia or infarction. Apical and septal wall thinning. 2. Normal left ventricular wall motion. 3. Left ventricular ejection fraction 66% 4. Non invasive risk stratification*: Low *2012 Appropriate Use Criteria for Coronary Revascularization Focused Update: J Am Coll Cardiol. [id number]. http://content.airportbarriers.com.aspx?articleid=1201161  Electronically Signed   By: Richardean Sale M.D.   On: 10/07/2015 13:32    Assessment & Plan    17F with CAD (DES to Cx 04/2013, moderately severe dz in non-dominant RCA), HTN, HLD (intolerant of statins), chronic diastolic CHF, severe panic attacks underwent decompression and fusion at L3-4, 4-5 on 09/23/15. She developed chest pain while in the ICU recovering from surgery with monitoring showing NSVT, SVT, PACs. Troponin negative. 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  1. Chest pain with history of CAD - troponin neg. Per patient, not unusual for her to have pain at home. Continue ASA, BB, amlodipine. Nuc done yesterday and determined low risk. Plan to continue medical therapy.   2. SVT vs NSVT, PACs - Continue BB, increased during this admission  3. Chronic diastolic CHF - no significant volume overload. Continue oral home Lasix.   4. HTN - continue present meds  Signed, Reino Bellis NP-C Pager [phone] As above; pt seen and examined; no chest pain or dyspnea; nuclear study with normal LV function and no ischemia; plan medical therapy; pt can be DCed from a cardiac standpoint on preadmission meds and fu with Dr Bronson Ing in [place]. Please call with questions. Kirk Ruths

## 2015-10-12 ENCOUNTER — Encounter: Payer: Self-pay | Admitting: Internal Medicine

## 2015-10-12 ENCOUNTER — Non-Acute Institutional Stay (SKILLED_NURSING_FACILITY): Payer: Medicare Other | Admitting: Internal Medicine

## 2015-10-12 ENCOUNTER — Other Ambulatory Visit: Payer: Self-pay | Admitting: Internal Medicine

## 2015-10-12 DIAGNOSIS — G039 Meningitis, unspecified: Secondary | ICD-10-CM | POA: Diagnosis not present

## 2015-10-12 DIAGNOSIS — G9600 Cerebrospinal fluid leak, unspecified: Secondary | ICD-10-CM

## 2015-10-12 NOTE — Assessment & Plan Note (Signed)
Monitor for symptoms/signs of infection

## 2015-10-12 NOTE — Patient Instructions (Addendum)
New orders for [employer] entry.CBC and differential;iron panel;B12 level

## 2015-10-12 NOTE — Progress Notes (Signed)
Facility Location: [facility] Room Number: [id number]  [id number], PA-C   [place] [id number]    This is a comprehensive admission note to [place] personally performed by Unice Cobble MD on this date less than 30 days from date of admission. Included are preadmission medical/surgical history;reconciled medication list; family history; social history and comprehensive review of systems.  Corrections and additions to the records were documented . Comprehensive physical exam was also performed. Additionally a clinical summary was entered for each active diagnosis pertinent to this admission in the Problem List to enhance continuity of care.   HPI: The patient was hospitalized 6/8-6/23/17 . On 6/9 bilateral L3 and L4 facetectomy, lysis of adhesions, decompression of the thecal sac, foraminotomy to decompress the L3-L4 and L5 nerve roots, pedicle screws L3-4-L5, and posterior lateral arthrodesis. After surgical repair there was CSF leak necessitating lumbar drain placement.   The abbreviated discharge summary listed meningitis as a diagnosis. Culture of the drain fluid 09/29/15 revealed no growth. White count of 725 with 81 percent and 1% neutrophils was found in the CSF.No organisms were found but infectious disease was concerned about infecctious process. 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 The follow up  infectious disease note of 6/20 states  that she was to be on vancomycin 6/12-6/25.  Discharge medication included vancomycin 1000 thousand milligrams intravenous daily through 10/10/15. She did have a mild increase in her creatinineAs IP her  glucose ranged from 100-163. Normochromic, normocytic anemia with hematocrit as low as 26.3 was documented.  Past medical and surgical history:  The patient had previously had  laminectomy and decompression in 2014 for  pain with radiation to both legs with burning  sensation.  History of MI with bypass grafting. Chronic diastolic heart failure as well as peripheral vascular disease are present. Esophageal dilation and on polypectomy in March of this year  Social history: Reviewed  Family history: Reviewed  Review of systems: She states that she continues to have cramping pain in the left calf with ambulation and has profound weakness of the left lower extremity. She previously had diarrhea which is reason she had  colonoscopy in March of this year. Despite esophageal dilation in March;she continues to have some dysphagia. Stool may have been dark recently She describes intermittent chest pain with radiation to left upper extremity. Cardiology does actively follow her. She has nitroglycerin as needed. The last episode of chest discomfort was 2 days ago. She admits to anxiety but not significant depression.   Constitutional: No fever,significant weight change, fatigue  Eyes: No redness, discharge, pain, vision change ENT/mouth: No nasal congestion,  purulent discharge, earache,change in hearing ,sore throat  Cardiovascular:   Respiratory: No cough, sputum production,hemoptysis,  significant snoring,apnea   Gastrointestinal: No heartburn,abdominal pain, nausea / vomiting,rectal bleeding, melena,change in bowels Genitourinary: No dysuria,hematuria, pyuria,  incontinence, nocturia Dermatologic: No rash, pruritus, change in appearance of skin Neurologic: No dizziness,headache,syncope, seizures, numbness , tingling Psychiatric: some anorexia Endocrine: No change in hair/skin/ nails, excessive thirst, excessive hunger, excessive urination  Hematologic/lymphatic: No significant bruising, lymphadenopathy,abnormal bleeding Allergy/immunology: No itchy/ watery eyes, significant sneezing, urticaria, angioedema  Physical exam:  Pertinent or positive findings: She has bilateral ptosis. Complete dentures are noted. Abdomen is protuberant Pedal pulses are  decreased. There is a surgical dressing over the lumbosacral spine.  General appearance:Adequately nourished; no acute distress , increased work of breathing is present.   Lymphatic: No lymphadenopathy about the head,  neck, axilla . Eyes: No conjunctival inflammation or lid edema is present. There is no scleral icterus. Ears:  External ear exam shows no significant lesions or deformities.   Nose:  External nasal examination shows no deformity or inflammation. Nasal mucosa are pink and moist without lesions ,exudates Oral exam: lips and gums are healthy appearing.There is no oropharyngeal erythema or exudate . Neck:  No thyromegaly, masses, tenderness noted.    Heart:  Normal rate and regular rhythm. S1 and S2 normal without gallop, murmur, click, rub .  Lungs:Chest clear to auscultation without wheezes, rhonchi,rales , rubs. Abdomen:Bowel sounds are normal. Abdomen is soft and nontender with no organomegaly, hernias,masses. GU: deferred  Extremities:  No cyanosis, clubbing,edema  Neurologic exam : Strength equal  in upper & lower extremities Balance,Rhomberg,finger to nose testing could not be completed due to clinical state Deep tendon reflexes are equal Skin: Warm & dry w/o tenting. No significant lesions or rash.  See clinical summary under each active problem in the Problem List with associated updated therapeutic plan

## 2015-10-12 NOTE — Assessment & Plan Note (Signed)
Follow-up with Dr. Joya Salm 3 weeks post discharge

## 2015-10-13 ENCOUNTER — Other Ambulatory Visit: Payer: Self-pay | Admitting: *Deleted

## 2015-10-13 ENCOUNTER — Encounter (HOSPITAL_COMMUNITY)
Admission: RE | Admit: 2015-10-13 | Discharge: 2015-10-13 | Disposition: A | Discharge status: Home / Self Care | Payer: No Typology Code available for payment source | Source: Skilled Nursing Facility | Attending: Internal Medicine | Admitting: Internal Medicine

## 2015-10-13 DIAGNOSIS — Z48811 Encounter for surgical aftercare following surgery on the nervous system: Secondary | ICD-10-CM | POA: Diagnosis not present

## 2015-10-13 DIAGNOSIS — Z96651 Presence of right artificial knee joint: Secondary | ICD-10-CM | POA: Diagnosis not present

## 2015-10-13 DIAGNOSIS — D500 Iron deficiency anemia secondary to blood loss (chronic): Secondary | ICD-10-CM | POA: Insufficient documentation

## 2015-10-13 LAB — CBC WITH DIFFERENTIAL/PLATELET
BASOS ABS: 0.2 10*3/uL — AB (ref 0.0–0.1)
BASOS PCT: 2 %
EOS ABS: 0.6 10*3/uL (ref 0.0–0.7)
Eosinophils Relative: 8 %
HCT: 28.2 % — ABNORMAL LOW (ref 36.0–46.0)
HEMOGLOBIN: 9.6 g/dL — AB (ref 12.0–15.0)
Lymphocytes Relative: 30 %
Lymphs Abs: 2.2 10*3/uL (ref 0.7–4.0)
MCH: 31 pg (ref 26.0–34.0)
MCHC: 34 g/dL (ref 30.0–36.0)
MCV: 91 fL (ref 78.0–100.0)
Monocytes Absolute: 0.6 10*3/uL (ref 0.1–1.0)
Monocytes Relative: 9 %
NEUTROS PCT: 51 %
Neutro Abs: 3.7 10*3/uL (ref 1.7–7.7)
PLATELETS: 472 10*3/uL — AB (ref 150–400)
RBC: 3.1 MIL/uL — AB (ref 3.87–5.11)
RDW: 13.8 % (ref 11.5–15.5)
WBC: 7.3 10*3/uL (ref 4.0–10.5)

## 2015-10-13 LAB — VITAMIN B12: VITAMIN B 12: 444 pg/mL (ref 180–914)

## 2015-10-13 LAB — FERRITIN: FERRITIN: 52 ng/mL (ref 11–307)

## 2015-10-13 LAB — IRON AND TIBC
IRON: 30 ug/dL (ref 28–170)
SATURATION RATIOS: 8 % — AB (ref 10.4–31.8)
TIBC: 382 ug/dL (ref 250–450)
UIBC: 352 ug/dL

## 2015-10-13 MED ORDER — OXYCODONE-ACETAMINOPHEN 5-325 MG PO TABS: 1.0000 | ORAL_TABLET | ORAL | Status: DC | PRN

## 2015-10-13 NOTE — Telephone Encounter (Signed)
[facility]  

## 2015-10-14 ENCOUNTER — Ambulatory Visit (INDEPENDENT_AMBULATORY_CARE_PROVIDER_SITE_OTHER): Payer: Medicare Other | Admitting: Internal Medicine

## 2015-10-14 ENCOUNTER — Encounter: Payer: Self-pay | Admitting: Internal Medicine

## 2015-10-14 VITALS — BP 127/74 | HR 70 | Temp 98.1°F

## 2015-10-14 DIAGNOSIS — M5432 Sciatica, left side: Secondary | ICD-10-CM | POA: Diagnosis not present

## 2015-10-14 DIAGNOSIS — I2510 Atherosclerotic heart disease of native coronary artery without angina pectoris: Secondary | ICD-10-CM

## 2015-10-14 DIAGNOSIS — Z189 Retained foreign body fragments, unspecified material: Secondary | ICD-10-CM | POA: Diagnosis not present

## 2015-10-14 DIAGNOSIS — G039 Meningitis, unspecified: Secondary | ICD-10-CM

## 2015-10-14 DIAGNOSIS — G3184 Mild cognitive impairment, so stated: Secondary | ICD-10-CM | POA: Insufficient documentation

## 2015-10-14 DIAGNOSIS — T8189XA Other complications of procedures, not elsewhere classified, initial encounter: Secondary | ICD-10-CM

## 2015-10-14 DIAGNOSIS — G9782 Other postprocedural complications and disorders of nervous system: Secondary | ICD-10-CM | POA: Diagnosis not present

## 2015-10-14 NOTE — Patient Instructions (Signed)
No heavy lifting with right arm for 24 hours.  Leave dressing in place for at least 8 hours.

## 2015-10-14 NOTE — Progress Notes (Signed)
Per Dr Baxter Flattery 37cm  Single lumen Peripherally Inserted Central Catheter  removed from right basilic . No sutures present. Dressing was clean and dry . Area cleansed with chlorhexidine and petroleum dressing applied. Pt advised no heavy lifting with this arm, leave dressing for 24 hours and call the office if dressing becomes soaked with blood or sharp pain presents.  Pt tolerated procedure well.    Laverle Patter, RN

## 2015-10-14 NOTE — Progress Notes (Signed)
RFV: hospital follow up Subjective:    Patient ID: [name], female    DOB: November 10, 1932, 82 y.o.   MRN: [id number]  HPI ALEAN [name] is a 82 y.o. 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 She underwent an Exploration of the lumbar wound. Suture over calcified dura mater opening in the midline. Insertion of lumbar catheter for drain on 6/12. Drained removed once CSF output had diminished from drain. She was discharge to rehab facility She finished IV vanco on 10/10/15.   She states that she has some low back pain that radiates to left hip/leg, feels like her sciatica  Allergies  Allergen Reactions  . Cephalosporins Diarrhea and Nausea Only    Lightheaded  . Levaquin [Levofloxacin In D5w] Swelling  . Macrodantin [Nitrofurantoin Macrocrystal] Swelling  . Phenothiazines Anaphylaxis and Hives  . Polysorbate Anaphylaxis  . Prednisone Shortness Of Breath  . Buspirone Itching  . Cardura [Doxazosin Mesylate] Itching  . Codeine Itching  . Acyclovir And Related   . Prochlorperazine Other (See Comments)    unknown  . Ranexa [Ranolazine]     Severe drop in BP  . Atorvastatin Hives    Cramping; tolerates Crestor ok  . Ofloxacin Rash  . Other Itching and Rash    "WOOL"= make skin look like it has been burned  . Penicillins Other (See Comments)    Causes redness all over. Has patient had a PCN reaction causing immediate rash, facial/tongue/throat swelling, SOB or lightheadedness with hypotension: No Has patient had a PCN reaction causing severe rash involving mucus membranes or skin necrosis: No Has patient had a PCN reaction that required hospitalization No Has patient had a PCN reaction occurring within the last 10 years: No If all of the above answers are "NO", then may  proceed with Cephalosporin use.   . Pimozide Hives and Itching   Current Outpatient Prescriptions on File Prior to Visit  Medication Sig Dispense Refill  . acetaminophen (TYLENOL) 500 MG tablet Take 500 mg by mouth daily as needed for headache.     . ALPRAZolam (XANAX) 0.25 MG tablet Take 0.25 mg by mouth 2 (two) times daily as needed for anxiety.     [name] Kitchen aspirin 81 MG EC tablet Take 81 mg by mouth every morning.     . Carbamide Peroxide (EAR WAX DROPS OT) 4 drops into both ears once a day on Tue, Sat.  0  . carvedilol (COREG) 25 MG tablet Take 25 mg by mouth 2 (two) times daily with a meal.    . DULoxetine (CYMBALTA) 60 MG capsule Take 60 mg by mouth at bedtime.     . fluticasone (FLONASE) 50 MCG/ACT nasal spray Place 2 sprays into both nostrils daily as needed for allergies.     . furosemide (LASIX) 20 MG tablet Take 1 tablet (20 mg total) by mouth daily. 90 tablet 3  . gabapentin (NEURONTIN) 300 MG capsule Take 1 capsule (300 mg total) by mouth 3 (three) times daily. 90 capsule 2  . loperamide (IMODIUM A-D) 2 MG tablet Take 2 mg by mouth 4 (four) times daily as needed for diarrhea or loose stools.    . nitroGLYCERIN (NITROSTAT) 0.4 MG SL tablet Place 0.4 mg under the tongue every 5 (five) minutes as needed for chest pain. Reported on 08/04/2015    .  oxyCODONE-acetaminophen (PERCOCET/ROXICET) 5-325 MG tablet Take 1-2 tablets by mouth every 4 (four) hours as needed for moderate pain. Max APAP 3gm/24hours from all sources 360 tablet 0  . pantoprazole (PROTONIX) 40 MG tablet Take 40 mg by mouth every morning.     . potassium chloride (K-DUR,KLOR-CON) 10 MEQ tablet TAKE TWO (2) TABLETS BY MOUTH DAILY. 60 tablet 6  . Probiotic Product (RESTORA) CAPS Take 1 capsule by mouth daily. 30 capsule 0   No current facility-administered medications on file prior to visit.   Active Ambulatory Problems    Diagnosis Date Noted  . HYPERLIPIDEMIA-MIXED 11/27/2008  . PANIC ATTACK 02/28/2008  . MI 02/28/2008    . CAD, NATIVE VESSEL - PCI + DES to left circumflex 05/14/13 11/27/2008  . Chronic diastolic heart failure ([place]) 11/30/2008  . Peripheral vascular disease ([place]) 11/27/2008  . Internal hemorrhoids 02/28/2008  . GASTROESOPHAGEAL REFLUX DISEASE, CHRONIC 02/28/2008  . COLITIS 02/28/2008  . FATIGUE 04/13/2009  . DYSPNEA 11/30/2008  . Dysphagia 02/28/2008  . Major depression ([place]) 09/28/2010  . Hematochezia 02/13/2011  . Other and unspecified angina pectoris 05/14/2013  . Lower extremity edema 11/24/2013  . Acute kidney failure ([place]) 11/24/2013  . Hypokalemia 11/24/2013  . Rectal bleeding 07/06/2014  . Constipation 07/06/2014  . CHF (congestive heart failure) ([place]) 07/29/2014  . Diastolic CHF, acute on chronic (HCC) 07/30/2014  . Hemorrhoids 08/06/2014  . Normocytic anemia 08/07/2014  . Weight gain 08/12/2014  . Pain in the chest   . Hyperlipidemia   . PAOD (peripheral arterial occlusive disease) ([place]) 03/05/2015  . Diarrhea 04/01/2015  . Esophageal dysphagia 04/01/2015  . Gastric polyp   . History of colonic polyps   . Chronic diarrhea   . Spondylolisthesis of lumbar region 09/24/2015  . CSF leak 09/27/2015  . Meningitis 10/01/2015  . Coronary artery disease due to lipid rich plaque   . NSVT (nonsustained ventricular tachycardia) ([place])   . PAF (paroxysmal atrial fibrillation) ([place])   . Iron deficiency anemia due to chronic blood loss   . Mild cognitive impairment 10/14/2015  . Chest pain at rest 10/15/2015  . Chronic low back pain 10/15/2015  . Hyponatremia 10/15/2015  . Hyperglycemia 10/15/2015  . Thrombocytosis ([place]) 10/15/2015  . Chest pain 10/15/2015  . Depression   . Anxiety   . Gastroesophageal reflux disease without esophagitis    Resolved Ambulatory Problems    Diagnosis Date Noted  . History of cardiovascular disorder 02/28/2008  . Abdominal pain 02/13/2011  . Chest pain 11/24/2013  . Dyspnea on exertion   . Bowel habit changes 04/01/2015  . Ischemic chest  pain [facility])    Past Medical History  Diagnosis Date  . PVD (peripheral vascular disease) ([place])   . HTN (hypertension)   . MI (myocardial infarction) ([place]) 2006  . Internal hemorrhoids without mention of complication   . Dysphagia, unspecified(787.20)   . Microscopic colitis 2003  . GERD (gastroesophageal reflux disease)   . Hyperlipemia   . Thyroid disease   . S/P colonoscopy 09/27/2001  . Arthritis   . Heart disease   . Bursitis   . Sleep disorder   . COPD (chronic obstructive pulmonary disease) ([place])   . Complication of anesthesia   . PONV (postoperative nausea and vomiting)   . Pneumonia 12/2011  . Heart murmur   . Cataract   . Paresthesia   . Headache    Sochx: currently at SNF   Review of Systems Fatigued, sob with activity, less indurance, back pain. 10 point  ros is other wise negative    Objective:   Physical Exam BP 127/74 mmHg  Pulse 70  Temp(Src) 98.1 F (36.7 C) (Oral)  Wt  gen = a x o by 3 in NAD HEENT = mmm, no signs of thrush pulm = ctab ,no w/c/r Cors = nl s1,s2, no g/m/r Ext= no c/c/e Back = surgical incision is healing. No surrounding erythema, though she has her sutures in place       Assessment & Plan:  - to pull picc line today - to go to neurosurgery to remove sutures - back pain to defer to dr hopper, would recommend voltaren cream to avoid opiates  We coordinated with NSGY office and SNF for transportation to her next appointment  rtc as needed  Spent 35 min with patient with greater than 50% in coordination of care

## 2015-10-15 ENCOUNTER — Encounter: Payer: Self-pay | Admitting: Internal Medicine

## 2015-10-15 ENCOUNTER — Emergency Department (HOSPITAL_COMMUNITY): Payer: Medicare Other

## 2015-10-15 ENCOUNTER — Encounter (HOSPITAL_COMMUNITY): Payer: Self-pay | Admitting: Emergency Medicine

## 2015-10-15 ENCOUNTER — Observation Stay (HOSPITAL_COMMUNITY)
Admission: EM | Admit: 2015-10-15 | Discharge: 2015-10-16 | Disposition: A | Discharge status: Home / Self Care | Payer: Medicare Other | Attending: Internal Medicine | Admitting: Internal Medicine

## 2015-10-15 ENCOUNTER — Non-Acute Institutional Stay (SKILLED_NURSING_FACILITY): Payer: Medicare Other | Admitting: Internal Medicine

## 2015-10-15 DIAGNOSIS — D473 Essential (hemorrhagic) thrombocythemia: Secondary | ICD-10-CM | POA: Diagnosis present

## 2015-10-15 DIAGNOSIS — M545 Low back pain: Secondary | ICD-10-CM

## 2015-10-15 DIAGNOSIS — R079 Chest pain, unspecified: Secondary | ICD-10-CM | POA: Diagnosis not present

## 2015-10-15 DIAGNOSIS — R072 Precordial pain: Secondary | ICD-10-CM | POA: Diagnosis not present

## 2015-10-15 DIAGNOSIS — N179 Acute kidney failure, unspecified: Secondary | ICD-10-CM | POA: Diagnosis not present

## 2015-10-15 DIAGNOSIS — F329 Major depressive disorder, single episode, unspecified: Secondary | ICD-10-CM | POA: Diagnosis present

## 2015-10-15 DIAGNOSIS — J449 Chronic obstructive pulmonary disease, unspecified: Secondary | ICD-10-CM | POA: Diagnosis not present

## 2015-10-15 DIAGNOSIS — I739 Peripheral vascular disease, unspecified: Secondary | ICD-10-CM | POA: Insufficient documentation

## 2015-10-15 DIAGNOSIS — F32A Depression, unspecified: Secondary | ICD-10-CM | POA: Insufficient documentation

## 2015-10-15 DIAGNOSIS — M1990 Unspecified osteoarthritis, unspecified site: Secondary | ICD-10-CM | POA: Diagnosis not present

## 2015-10-15 DIAGNOSIS — R0602 Shortness of breath: Secondary | ICD-10-CM | POA: Diagnosis not present

## 2015-10-15 DIAGNOSIS — R739 Hyperglycemia, unspecified: Secondary | ICD-10-CM | POA: Diagnosis present

## 2015-10-15 DIAGNOSIS — Z87891 Personal history of nicotine dependence: Secondary | ICD-10-CM | POA: Insufficient documentation

## 2015-10-15 DIAGNOSIS — I110 Hypertensive heart disease with heart failure: Secondary | ICD-10-CM | POA: Insufficient documentation

## 2015-10-15 DIAGNOSIS — I5032 Chronic diastolic (congestive) heart failure: Secondary | ICD-10-CM | POA: Diagnosis present

## 2015-10-15 DIAGNOSIS — E785 Hyperlipidemia, unspecified: Secondary | ICD-10-CM | POA: Diagnosis not present

## 2015-10-15 DIAGNOSIS — K219 Gastro-esophageal reflux disease without esophagitis: Secondary | ICD-10-CM

## 2015-10-15 DIAGNOSIS — F419 Anxiety disorder, unspecified: Secondary | ICD-10-CM | POA: Insufficient documentation

## 2015-10-15 DIAGNOSIS — E871 Hypo-osmolality and hyponatremia: Secondary | ICD-10-CM | POA: Diagnosis present

## 2015-10-15 DIAGNOSIS — I2510 Atherosclerotic heart disease of native coronary artery without angina pectoris: Secondary | ICD-10-CM

## 2015-10-15 DIAGNOSIS — I252 Old myocardial infarction: Secondary | ICD-10-CM | POA: Diagnosis not present

## 2015-10-15 DIAGNOSIS — D75839 Thrombocytosis, unspecified: Secondary | ICD-10-CM | POA: Diagnosis present

## 2015-10-15 DIAGNOSIS — Z79899 Other long term (current) drug therapy: Secondary | ICD-10-CM | POA: Insufficient documentation

## 2015-10-15 DIAGNOSIS — R0789 Other chest pain: Principal | ICD-10-CM | POA: Diagnosis present

## 2015-10-15 DIAGNOSIS — Z7982 Long term (current) use of aspirin: Secondary | ICD-10-CM | POA: Insufficient documentation

## 2015-10-15 DIAGNOSIS — G8929 Other chronic pain: Secondary | ICD-10-CM | POA: Diagnosis present

## 2015-10-15 DIAGNOSIS — M25552 Pain in left hip: Secondary | ICD-10-CM

## 2015-10-15 DIAGNOSIS — I509 Heart failure, unspecified: Secondary | ICD-10-CM | POA: Insufficient documentation

## 2015-10-15 DIAGNOSIS — Z23 Encounter for immunization: Secondary | ICD-10-CM | POA: Insufficient documentation

## 2015-10-15 DIAGNOSIS — I2583 Coronary atherosclerosis due to lipid rich plaque: Secondary | ICD-10-CM

## 2015-10-15 DIAGNOSIS — D649 Anemia, unspecified: Secondary | ICD-10-CM | POA: Diagnosis present

## 2015-10-15 LAB — TROPONIN I
Troponin I: <0.03 ng/mL (ref <0.03)
Troponin I: <0.03 ng/mL (ref <0.03)

## 2015-10-15 LAB — CBC
HEMATOCRIT: 29.8 % — AB (ref 36.0–46.0)
Hemoglobin: 10 g/dL — ABNORMAL LOW (ref 12.0–15.0)
MCH: 30.2 pg (ref 26.0–34.0)
MCHC: 33.6 g/dL (ref 30.0–36.0)
MCV: 90 fL (ref 78.0–100.0)
PLATELETS: 432 10*3/uL — AB (ref 150–400)
RBC: 3.31 MIL/uL — ABNORMAL LOW (ref 3.87–5.11)
RDW: 13.6 % (ref 11.5–15.5)
WBC: 6 10*3/uL (ref 4.0–10.5)

## 2015-10-15 LAB — BASIC METABOLIC PANEL
Anion gap: 10 (ref 5–15)
BUN: 24 mg/dL — AB (ref 6–20)
CO2: 24 mmol/L (ref 22–32)
Calcium: 8.8 mg/dL — ABNORMAL LOW (ref 8.9–10.3)
Chloride: 100 mmol/L — ABNORMAL LOW (ref 101–111)
Creatinine, Ser: 1.24 mg/dL — ABNORMAL HIGH (ref 0.44–1.00)
GFR, EST AFRICAN AMERICAN: 46 mL/min — AB (ref >60)
GFR, EST NON AFRICAN AMERICAN: 40 mL/min — AB (ref >60)
Glucose, Bld: 197 mg/dL — ABNORMAL HIGH (ref 65–99)
POTASSIUM: 4.4 mmol/L (ref 3.5–5.1)
SODIUM: 134 mmol/L — AB (ref 135–145)

## 2015-10-15 LAB — D-DIMER, QUANTITATIVE (NOT AT ARMC): D DIMER QUANT: 7.09 ug{FEU}/mL — AB (ref 0.00–0.50)

## 2015-10-15 LAB — MRSA PCR SCREENING: MRSA BY PCR: NEGATIVE

## 2015-10-15 LAB — BRAIN NATRIURETIC PEPTIDE: B NATRIURETIC PEPTIDE 5: 59 pg/mL (ref 0.0–100.0)

## 2015-10-15 MED ORDER — PANTOPRAZOLE SODIUM 40 MG PO TBEC
40.0000 mg | DELAYED_RELEASE_TABLET | Freq: Every morning | ORAL | Status: DC
  Administered 2015-10-16: 40 mg via ORAL
  Filled 2015-10-15: qty 1

## 2015-10-15 MED ORDER — SODIUM CHLORIDE 0.9 % IV SOLN: INTRAVENOUS | Status: DC

## 2015-10-15 MED ORDER — ASPIRIN 81 MG PO CHEW: 324.0000 mg | CHEWABLE_TABLET | ORAL | Status: AC

## 2015-10-15 MED ORDER — NITROGLYCERIN 0.4 MG SL SUBL: 0.4000 mg | SUBLINGUAL_TABLET | SUBLINGUAL | Status: DC | PRN

## 2015-10-15 MED ORDER — SODIUM CHLORIDE 0.9 % IV SOLN: INTRAVENOUS | Status: AC

## 2015-10-15 MED ORDER — OXYCODONE-ACETAMINOPHEN 5-325 MG PO TABS
1.0000 | ORAL_TABLET | ORAL | Status: DC | PRN
  Administered 2015-10-16 (×2): 2 via ORAL
  Filled 2015-10-15 (×2): qty 2

## 2015-10-15 MED ORDER — GABAPENTIN 300 MG PO CAPS
300.0000 mg | ORAL_CAPSULE | Freq: Three times a day (TID) | ORAL | Status: DC
  Administered 2015-10-15 – 2015-10-16 (×3): 300 mg via ORAL
  Filled 2015-10-15 (×3): qty 1

## 2015-10-15 MED ORDER — ACETAMINOPHEN 325 MG PO TABS: 650.0000 mg | ORAL_TABLET | ORAL | Status: DC | PRN

## 2015-10-15 MED ORDER — ENOXAPARIN SODIUM 40 MG/0.4ML ~~LOC~~ SOLN
40.0000 mg | SUBCUTANEOUS | Status: DC
  Administered 2015-10-15: 40 mg via SUBCUTANEOUS
  Filled 2015-10-15: qty 0.4

## 2015-10-15 MED ORDER — CARVEDILOL 12.5 MG PO TABS
25.0000 mg | ORAL_TABLET | Freq: Two times a day (BID) | ORAL | Status: DC
  Administered 2015-10-16 (×2): 25 mg via ORAL
  Filled 2015-10-15 (×2): qty 2

## 2015-10-15 MED ORDER — ASPIRIN EC 81 MG PO TBEC
81.0000 mg | DELAYED_RELEASE_TABLET | Freq: Every morning | ORAL | Status: DC
  Administered 2015-10-16: 81 mg via ORAL
  Filled 2015-10-15: qty 1

## 2015-10-15 MED ORDER — PNEUMOCOCCAL VAC POLYVALENT 25 MCG/0.5ML IJ INJ
0.5000 mL | INJECTION | INTRAMUSCULAR | Status: AC
  Administered 2015-10-16: 0.5 mL via INTRAMUSCULAR
  Filled 2015-10-15: qty 0.5

## 2015-10-15 MED ORDER — IOPAMIDOL (ISOVUE-370) INJECTION 76%
80.0000 mL | Freq: Once | INTRAVENOUS | Status: AC | PRN
  Administered 2015-10-15: 80 mL via INTRAVENOUS

## 2015-10-15 MED ORDER — ALPRAZOLAM 0.25 MG PO TABS
0.2500 mg | ORAL_TABLET | Freq: Two times a day (BID) | ORAL | Status: DC | PRN
Start: 2015-10-15 — End: 2015-10-16
  Administered 2015-10-15: 0.25 mg via ORAL
  Filled 2015-10-15: qty 1

## 2015-10-15 MED ORDER — DULOXETINE HCL 60 MG PO CPEP
60.0000 mg | ORAL_CAPSULE | Freq: Every day | ORAL | Status: DC
Start: 2015-10-15 — End: 2015-10-16
  Administered 2015-10-15: 60 mg via ORAL
  Filled 2015-10-15: qty 1

## 2015-10-15 MED ORDER — ASPIRIN 300 MG RE SUPP: 300.0000 mg | RECTAL | Status: AC

## 2015-10-15 MED ORDER — DICLOFENAC SODIUM 1 % TD GEL
2.0000 g | Freq: Four times a day (QID) | TRANSDERMAL | Status: DC | PRN
  Filled 2015-10-15: qty 100

## 2015-10-15 MED ORDER — ONDANSETRON HCL 4 MG/2ML IJ SOLN: 4.0000 mg | Freq: Four times a day (QID) | INTRAMUSCULAR | Status: DC | PRN

## 2015-10-15 MED ORDER — MORPHINE SULFATE (PF) 2 MG/ML IV SOLN
2.0000 mg | INTRAVENOUS | Status: DC | PRN
  Administered 2015-10-15: 2 mg via INTRAVENOUS
  Filled 2015-10-15: qty 1

## 2015-10-15 NOTE — ED Provider Notes (Signed)
CSN: [id number]     Arrival date & time 10/15/15  1226 History   First MD Initiated Contact with Patient 10/15/15 1301     Chief Complaint  Patient presents with  . Chest Pain      HPI Pt was seen at 1305. Per EMS, NH report, pt's family and pt: c/o gradual onset and improvement of one episode of chest "pain" that occurred 1140 today. Pt describes the CP as "pressure," located in her mid-sternal area with radiation into her LUE. Was associated with SOB. NH staff gave SL ntg x1 with improvement. Pt states the radiation into her LUE was "new" today. Pt also c/o persistent LBP and left hip pain s/p lumbar surgery 2 weeks ago. Pt's family states they saw pt's 24 and NH MD yesterday for same and "is going to have her medicines changed around." Family states they had EMS bring pt to ED "for her heart pain because that's what we're worried about." Family states "her back and hip pain have been there since surgery and we're not worried about that." Denies palpitations, no cough, no abd pain, no N/V/D, no fevers, no rash.    Past Medical History  Diagnosis Date  . PVD (peripheral vascular disease) ([place])   . Other and unspecified hyperlipidemia   . Panic disorder without agoraphobia   . HTN (hypertension)     Hx of it  . MI (myocardial infarction) ([place]) 2006  . Internal hemorrhoids without mention of complication   . Dysphagia, unspecified(787.20)   . Microscopic colitis 2003  . GERD (gastroesophageal reflux disease)     Hx Schatzki's ring, multiple EGD/ED last 01/06/2004  . Hyperlipemia   . Thyroid disease     recent abnl TSH per pt  . S/P colonoscopy 09/27/2001    internal hemorrhoids, desc colon inflam polyp, SB BX-chronic duodenitis, colitis  . Arthritis   . Shortness of breath   . Heart disease   . Bursitis     left shoulder  . Hyperlipidemia   . Sleep disorder     obstructive  . COPD (chronic obstructive pulmonary disease) ([place])   . Complication of anesthesia   . PONV  (postoperative nausea and vomiting)     'a little nausea"  . Anxiety   . Depression   . Pneumonia 12/2011  . Heart murmur     'a littel'  . Cataract   . Paresthesia     hands, feet  . CHF (congestive heart failure) ([place])   . Headache   . Coronary atherosclerosis of native coronary artery     a. DES to CX, moderately severe stenosis RCA, mild stenosis LAD 04/2013   Past Surgical History  Procedure Laterality Date  . Tonsillectomy    . Unspecified area, hysterectomy  1972    partial  . Cholecystectomy  1998  . Right knee replacement  2007  . Right leg benign tumor    . Breast lumpectomy  1998    left, benign  . Left hand surgery    . Left rotator cuff surgery    . Nasal sinus surgery    . Bladder tack  06/2010  . Maloney dilation  03/16/2011    Gastritis. No H.pylori on bx. 65F maloney dilation with disruption of  occult cevical esophageal web  . Colonoscopy  03/16/2011    multiple hyperplastic colon polyps, sigmoid diverticulosis, melanosis coli  . Bladder suspension  11/09/2011    Procedure: TRANSVAGINAL TAPE (TVT) PROCEDURE;  Surgeon: Marissa Nestle, MD;  Location: [facility];  Service: Urology;  Laterality: N/A;  . Abdominal hysterectomy    . Appendectomy    . Carpal tunnel release  1989    left  . Anterior and posterior repair      with resection of vagina  . Shoulder surgery Left   . Joint replacement Right 2007  . Lumbar laminectomy/decompression microdiscectomy N/A 10/11/2012    Procedure: LUMBAR LAMINECTOMY/DECOMPRESSION MICRODISCECTOMY 2 LEVELS;  Surgeon: Floyce Stakes, MD;  Location: [place] [facility];  Service: Neurosurgery;  Laterality: N/A;  L3-4 L4-5 Laminectomy  . Left heart catheterization with coronary angiogram N/A 05/14/2013    Procedure: LEFT HEART CATHETERIZATION WITH CORONARY ANGIOGRAM;  Surgeon: Blane Ohara, MD;  Location: [facility] CATH LAB;  Service: Cardiovascular;  Laterality: N/A;  . Cardiac catheterization    . Cardiac catheterization    . Coronary  angioplasty with stent placement    . Colonoscopy with propofol N/A 07/05/2015    RMR:one 5 mm polyp in descending colon  . Esophagogastroduodenoscopy (egd) with propofol N/A 07/05/2015    [id number]  . Esophageal dilation N/A 07/05/2015    Procedure: ESOPHAGEAL DILATION;  Surgeon: Daneil Dolin, MD;  Location: [facility];  Service: Endoscopy;  Laterality: N/A;  . Biopsy  07/05/2015    Procedure: BIOPSY;  Surgeon: Daneil Dolin, MD;  Location: [facility];  Service: Endoscopy;;  gastric polyp biopsy, ascending colon biopsy  . Lumbar wound debridement N/A 09/27/2015    Procedure: Exploration of Lumbar Wound w/ Repair CSF Leak/Lumbar Drain Placement;  Surgeon: Leeroy Cha, MD;  Location: [place] [facility];  Service: Neurosurgery;  Laterality: N/A;   Family History  Problem Relation Age of Onset  . Coronary artery disease Other     family Hx-sons  . Cancer Other   . Stroke Other     family Hx  . Hypertension Other     family Hx  . Diabetes Brother   . Heart disease Son     before age 95  . Diabetes Son   . Stroke Daughter 38  . Stroke Mother    Social History  Substance Use Topics  . Smoking status: Former Smoker -- 1.00 packs/day for 64 years    Types: Cigarettes    Start date: 12/24/1947    Quit date: 11/17/2001  . Smokeless tobacco: Never Used     Comment: Quit smoking in 2003  . Alcohol Use: No   OB History    Gravida Para Term Preterm AB TAB SAB Ectopic Multiple Living   8 6   2     5      Review of Systems ROS: Statement: All systems negative except as marked or noted in the HPI; Constitutional: Negative for fever and chills. ; ; Eyes: Negative for eye pain, redness and discharge. ; ; ENMT: Negative for ear pain, hoarseness, nasal congestion, sinus pressure and sore throat. ; ; Cardiovascular: +CP, SOB. Negative for palpitations, diaphoresis, and peripheral edema. ; ; Respiratory: Negative for cough, wheezing and stridor. ; ; Gastrointestinal: Negative for nausea,  vomiting, diarrhea, abdominal pain, blood in stool, hematemesis, jaundice and rectal bleeding. . ; ; Genitourinary: Negative for dysuria, flank pain and hematuria. ; ; Musculoskeletal: +persistent LBP and left hip pain s/p surgery. Negative for neck pain. Negative for swelling and trauma.; ; Skin: Negative for pruritus, rash, abrasions, blisters, bruising and skin lesion.; ; Neuro: Negative for headache, lightheadedness and neck stiffness. Negative for weakness, altered level of consciousness, altered mental status, extremity weakness,  paresthesias, involuntary movement, seizure and syncope.      Allergies  Cephalosporins; Levaquin; Macrodantin; Phenothiazines; Polysorbate; Prednisone; Buspirone; Cardura; Codeine; Acyclovir and related; Prochlorperazine; Ranexa; Atorvastatin; Ofloxacin; Other; Penicillins; and Pimozide  Home Medications   Prior to Admission medications   Medication Sig Start Date End Date Taking? Authorizing Provider  acetaminophen (TYLENOL) 500 MG tablet Take 500 mg by mouth daily as needed for headache.     Historical Provider, MD  ALPRAZolam Duanne Moron) 0.25 MG tablet Take 0.25 mg by mouth 2 (two) times daily as needed for anxiety.     Historical Provider, MD  aspirin 81 MG EC tablet Take 81 mg by mouth every morning.     Historical Provider, MD  Carbamide Peroxide (EAR WAX DROPS OT) 4 drops into both ears once a day on Tue, Sat. 08/03/15   Historical Provider, MD  carvedilol (COREG) 25 MG tablet Take 25 mg by mouth 2 (two) times daily with a meal.    Historical Provider, MD  diclofenac sodium (VOLTAREN) 1 % GEL Apply to residents back as needed    Historical Provider, MD  DULoxetine (CYMBALTA) 60 MG capsule Take 60 mg by mouth at bedtime.     Historical Provider, MD  fluticasone (FLONASE) 50 MCG/ACT nasal spray Place 2 sprays into both nostrils daily as needed for allergies.     Historical Provider, MD  furosemide (LASIX) 20 MG tablet Take 1 tablet (20 mg total) by mouth daily.  05/25/15   Lendon Colonel, NP  gabapentin (NEURONTIN) 300 MG capsule Take 1 capsule (300 mg total) by mouth 3 (three) times daily. 10/08/15   Kevan Ny Ditty, MD  heparin lock flush 100 UNIT/ML SOLN injection Picc Flush with 5 cc normal saline and then 3 cc heparin u-100 daily once a aday    Historical Provider, MD  loperamide (IMODIUM A-D) 2 MG tablet Take 2 mg by mouth 4 (four) times daily as needed for diarrhea or loose stools.    Historical Provider, MD  methylPREDNISolone (MEDROL DOSEPAK) 4 MG TBPK tablet Tablets, dose pack; 4 mg; oral once a day    Historical Provider, MD  nitroGLYCERIN (NITROSTAT) 0.4 MG SL tablet Place 0.4 mg under the tongue every 5 (five) minutes as needed for chest pain. Reported on 08/04/2015    Historical Provider, MD  NON FORMULARY Magic cup should come on dinner tray give once a day.    Historical Provider, MD  Nutritional Supplements (BOOST NUTRITIONAL ENERGY PO) Should come on breakfast tray, Please document intake once a day.    Historical Provider, MD  oxyCODONE-acetaminophen (PERCOCET/ROXICET) 5-325 MG tablet Take 1-2 tablets by mouth every 4 (four) hours as needed for moderate pain. Max APAP 3gm/24hours from all sources 10/13/15   Estill Dooms, MD  pantoprazole (PROTONIX) 40 MG tablet Take 40 mg by mouth every morning.     Historical Provider, MD  potassium chloride (K-DUR,KLOR-CON) 10 MEQ tablet TAKE TWO (2) TABLETS BY MOUTH DAILY. 04/30/15   Herminio Commons, MD  Probiotic Product (RESTORA) CAPS Take 1 capsule by mouth daily. 08/04/15   Mahala Menghini, PA-C   BP 152/53 mmHg  Pulse 80  Temp(Src) 98.1 F (36.7 C) (Oral)  Resp 25  Wt 175 lb (79.379 kg)  SpO2 95% Physical Exam  1310: Physical examination:  Nursing notes reviewed; Vital signs and O2 SAT reviewed;  Constitutional: Well developed, Well nourished, Well hydrated, In no acute distress; Head:  Normocephalic, atraumatic; Eyes: EOMI, PERRL, No scleral icterus; ENMT: Mouth and  pharynx normal,  Mucous membranes moist; Neck: Supple, Full range of motion, No lymphadenopathy; Cardiovascular: Regular rate and rhythm, No gallop; Respiratory: Breath sounds coarse & equal bilaterally, No wheezes.  Speaking full sentences with ease, Normal respiratory effort/excursion; Chest: Nontender, Movement normal; Abdomen: Soft, Nontender, Nondistended, Normal bowel sounds; Genitourinary: No CVA tenderness; Spine:  No midline CS, TS, LS tenderness. +DSD midline lower back. +TTP left gluteal and hip muscles, no rash, no soft tissue crepitus..;; Extremities: Pulses normal, No tenderness, No edema, No calf edema or asymmetry.; Neuro: AA&Ox3, rambling historian. Major CN grossly intact. No facial droop. Speech clear. +LLE weakness s/p lumbar surgery at baseline per pt. Otherwise no gross focal motor deficits in extremities.; Skin: Color normal, Warm, Dry.; Psych:  Anxious.   ED Course  Procedures (including critical care time) Labs Review  Imaging Review  I have personally reviewed and evaluated these images and lab results as part of my medical decision-making.   EKG Interpretation   Date/Time:  Friday October 15 2015 12:41:06 EDT Ventricular Rate:  76 PR Interval:    QRS Duration: 93 QT Interval:  417 QTC Calculation: 469 R Axis:   13 Text Interpretation:  Sinus rhythm Low voltage, precordial leads When  compared with ECG of 10/01/2015 No significant change was found Confirmed  by [facility]  MD, Nunzio Cory [phone]) on 10/15/2015 1:51:39 PM      MDM  MDM Reviewed: previous chart, nursing note and vitals Reviewed previous: labs and ECG Interpretation: ECG, labs, x-ray and CT scan      Results for orders placed or performed during the hospital encounter of [id number]  Basic metabolic panel  Result Value Ref Range   Sodium 134 (L) 135 - 145 mmol/L   Potassium 4.4 3.5 - 5.1 mmol/L   Chloride 100 (L) 101 - 111 mmol/L   CO2 24 22 - 32 mmol/L   Glucose, Bld 197 (H) 65 - 99 mg/dL   BUN 24 (H) 6 - 20  mg/dL   Creatinine, Ser 1.24 (H) 0.44 - 1.00 mg/dL   Calcium 8.8 (L) 8.9 - 10.3 mg/dL   GFR calc non Af Amer 40 (L) >60 mL/min   GFR calc Af Amer 46 (L) >60 mL/min   Anion gap 10 5 - 15  CBC  Result Value Ref Range   WBC 6.0 4.0 - 10.5 K/uL   RBC 3.31 (L) 3.87 - 5.11 MIL/uL   Hemoglobin 10.0 (L) 12.0 - 15.0 g/dL   HCT 29.8 (L) 36.0 - 46.0 %   MCV 90.0 78.0 - 100.0 fL   MCH 30.2 26.0 - 34.0 pg   MCHC 33.6 30.0 - 36.0 g/dL   RDW 13.6 11.5 - 15.5 %   Platelets 432 (H) 150 - 400 K/uL  Troponin I  Result Value Ref Range   Troponin I <0.03 <0.03 ng/mL  D-dimer, quantitative  Result Value Ref Range   D-Dimer, Quant 7.09 (H) 0.00 - 0.50 ug/mL-FEU  Brain natriuretic peptide  Result Value Ref Range   B Natriuretic Peptide 59.0 0.0 - 100.0 pg/mL   Dg Chest 2 View 10/15/2015  CLINICAL DATA:  Substernal chest pain and pressure since 1140 hours, shortness of breath, increased pain with inspiration, history smoking, pneumonia, hypertension, coronary disease post MI, CHF EXAM: CHEST  2 VIEW COMPARISON:  02/21/2015 FINDINGS: Upper normal heart size. Atherosclerotic calcification aorta. Mediastinal contours and pulmonary vascularity normal. Lungs clear. No pleural effusion or pneumothorax. Bones demineralized. Old healed fracture lateral LEFT sixth rib. IMPRESSION: No acute abnormalities. Aortic  atherosclerosis. Electronically Signed   By: Lavonia Dana M.D.   On: 10/15/2015 13:10   Ct Angio Chest Pe W/cm &/or Wo Cm 10/15/2015  CLINICAL DATA:  82 year old female with substernal chest pain and shortness of breath EXAM: CT ANGIOGRAPHY CHEST WITH CONTRAST TECHNIQUE: Multidetector CT imaging of the chest was performed using the standard protocol during bolus administration of intravenous contrast. Multiplanar CT image reconstructions and MIPs were obtained to evaluate the vascular anatomy. CONTRAST:  80 mL Isovue 370 COMPARISON:  Chest x-ray 10/15/2015; prior CT scan of the chest 07/29/2014 FINDINGS:  Mediastinum: Unremarkable CT appearance of the thyroid gland. No suspicious mediastinal or hilar adenopathy. No soft tissue mediastinal mass. The thoracic esophagus is unremarkable. Heart/Vascular: Adequate opacification of the pulmonary arteries to the segmental level. Evaluation of the subsegmental branches is limited by respiratory motion artifact. No focal filling defect to suggest acute pulmonary embolus. Normal caliber thoracic aorta. Two vessel anatomy. The right brachiocephalic and left common carotid artery share a common origin. No evidence of dissection. Calcifications present along the course of the coronary arteries. The heart is normal in size. There is no pericardial effusion. Lungs/Pleura: No evidence of pleural effusion. Evaluation for small pulmonary nodules is limited by excessive respiratory motion artifact. There may be very mild ground-glass attenuation opacity in the lung apices bilaterally. Stable nodularity in the periphery of the right middle lobe just inferior to the minor fissure. Bones/Soft Tissues: No acute fracture or aggressive appearing lytic or blastic osseous lesion. Upper Abdomen: Visualized upper abdominal organs are unremarkable. Review of the MIP images confirms the above findings. IMPRESSION: 1. Negative for acute pulmonary embolus, pneumonia or other acute cardiopulmonary process. 2. Perhaps trace ground-glass attenuation opacity in the lung apices which may reflect subsegmental atelectasis or very early edema. 3. Coronary artery calcifications. Electronically Signed   By: Jacqulynn Cadet M.D.   On: 10/15/2015 15:45    1610:  Remains symptom-free while in the ED. Change in usual CP pattern; will observation admit. Dx and testing d/w pt and family.  Questions answered.  Verb understanding, agreeable to admit.   T/C to [facility] Dr. Myna Hidalgo, case discussed, including:  HPI, pertinent PM/SHx, VS/PE, dx testing, ED course and treatment:  Agreeable to admit, requests to write  temporary orders, obtain observation tele bed to team [facility].   Francine Graven, DO 10/17/15 [phone]

## 2015-10-15 NOTE — ED Notes (Signed)
Patient with substernal chest pain she describes as a pressure that started at 1140 today. C/o some shortness of breath and increased pain with inspiration. Pt states she was lying in bed when it started. 1 SL nitro given by [facility] staff at 1150. EKG obtained by EMS upon their arrival, NSR.

## 2015-10-15 NOTE — Progress Notes (Signed)
Location:   [facility] Room Number: 133/P Place of Service:  SNF (31) Provider:  Justin Mend, PA-C  Patient Care Team: Tawni Carnes, PA-C as PCP - General (Physician Assistant) Daneil Dolin, MD (Gastroenterology)  Extended Emergency Contact Information Primary Emergency Contact: [name] [place] of [place] Phone: [phone] Relation: Daughter Secondary Emergency Contact: [name], [place] [address] [name] of [place] Phone: [phone] Relation: Son  Code Status:  DNR Goals of care: Advanced Directive information Advanced Directives 10/12/2015  Does patient have an advance directive? Yes  Type of Advance Directive Out of facility DNR (pink MOST or yellow form)  Does patient want to make changes to advanced directive? No - Patient declined  Copy of advanced directive(s) in chart? Yes     Chief Complaint  Patient presents with  . Acute Visit    Having chest pain and hip pain    HPI:  Pt is a 82 y.o. female seen today for an acute visit forChest pain and also left upper leg hip discomfort.  Patient reports midsternal chest pain that occurred this morning-questionable radiation to left arm-she says there is some radiation to the back she feels this is complicated with a recent history of  A cystectomy lumbar facetectomy-and decompression of lumbar nerve roots and arthrodesis after surgical repair there also was a CS F leak necessitating lumbar drain placement  She does have meningitis has a diagnosis culture of the drain did not show any growth infectious disease was concerned however about infectious process she was on vancomycin.  She also has a history of coronary artery disease with history of MI and bypass grafting as well as chronic diastolic CHF.  She does have orders for when necessary nitroglycerin-continues on aspirin and Coreg.  Regards diastolic CHF she is on Lasix with  potassium.  Currently she is not complaining of any acute shortness of breath fever chills or diaphoresis she is having persistent midsternal chest pain however complicated with significant left hip and leg pain for the leg pain she does receive Percocet 5-3 25 mg 1 tab every 4 hours when necessary.  Currently vital signs are stable as well as O2 saturations are in the 90s     Past Medical History  Diagnosis Date  . PVD (peripheral vascular disease) ([place])   . Other and unspecified hyperlipidemia   . Panic disorder without agoraphobia   . HTN (hypertension)     Hx of it  . MI (myocardial infarction) ([place]) 2006  . Internal hemorrhoids without mention of complication   . Dysphagia, unspecified(787.20)   . Microscopic colitis 2003  . GERD (gastroesophageal reflux disease)     Hx Schatzki's ring, multiple EGD/ED last 01/06/2004  . Hyperlipemia   . Thyroid disease     recent abnl TSH per pt  . S/P colonoscopy 09/27/2001    internal hemorrhoids, desc colon inflam polyp, SB BX-chronic duodenitis, colitis  . Arthritis   . Shortness of breath   . Heart disease   . Bursitis     left shoulder  . Hyperlipidemia   . Sleep disorder     obstructive  . COPD (chronic obstructive pulmonary disease) ([place])   . Complication of anesthesia   . PONV (postoperative nausea and vomiting)     'a little nausea"  . Anxiety   . Depression   . Pneumonia 12/2011  . Heart murmur     'a littel'  .  Cataract   . Paresthesia     hands, feet  . CHF (congestive heart failure) ([place])   . Headache   . Coronary atherosclerosis of native coronary artery     a. DES to CX, moderately severe stenosis RCA, mild stenosis LAD 04/2013   Past Surgical History  Procedure Laterality Date  . Tonsillectomy    . Unspecified area, hysterectomy  1972    partial  . Cholecystectomy  1998  . Right knee replacement  2007  . Right leg benign tumor    . Breast lumpectomy  1998    left, benign  . Left hand surgery    . Left  rotator cuff surgery    . Nasal sinus surgery    . Bladder tack  06/2010  . Maloney dilation  03/16/2011    Gastritis. No H.pylori on bx. 40F maloney dilation with disruption of  occult cevical esophageal web  . Colonoscopy  03/16/2011    multiple hyperplastic colon polyps, sigmoid diverticulosis, melanosis coli  . Bladder suspension  11/09/2011    Procedure: TRANSVAGINAL TAPE (TVT) PROCEDURE;  Surgeon: Marissa Nestle, MD;  Location: [facility];  Service: Urology;  Laterality: N/A;  . Abdominal hysterectomy    . Appendectomy    . Carpal tunnel release  1989    left  . Anterior and posterior repair      with resection of vagina  . Shoulder surgery Left   . Joint replacement Right 2007  . Lumbar laminectomy/decompression microdiscectomy N/A 10/11/2012    Procedure: LUMBAR LAMINECTOMY/DECOMPRESSION MICRODISCECTOMY 2 LEVELS;  Surgeon: Floyce Stakes, MD;  Location: [facility] ORS;  Service: Neurosurgery;  Laterality: N/A;  L3-4 L4-5 Laminectomy  . Left heart catheterization with coronary angiogram N/A 05/14/2013    Procedure: LEFT HEART CATHETERIZATION WITH CORONARY ANGIOGRAM;  Surgeon: Blane Ohara, MD;  Location: [facility] CATH LAB;  Service: Cardiovascular;  Laterality: N/A;  . Cardiac catheterization    . Cardiac catheterization    . Coronary angioplasty with stent placement    . Colonoscopy with propofol N/A 07/05/2015    RMR:one 5 mm polyp in descending colon  . Esophagogastroduodenoscopy (egd) with propofol N/A 07/05/2015    [id number]  . Esophageal dilation N/A 07/05/2015    Procedure: ESOPHAGEAL DILATION;  Surgeon: Daneil Dolin, MD;  Location: [facility];  Service: Endoscopy;  Laterality: N/A;  . Biopsy  07/05/2015    Procedure: BIOPSY;  Surgeon: Daneil Dolin, MD;  Location: [facility];  Service: Endoscopy;;  gastric polyp biopsy, ascending colon biopsy  . Lumbar wound debridement N/A 09/27/2015    Procedure: Exploration of Lumbar Wound w/ Repair CSF Leak/Lumbar Drain Placement;   Surgeon: Leeroy Cha, MD;  Location: [facility] NEUR[facility] ORS;  Service: Neurosurgery;  Laterality: N/A;    Allergies  Allergen Reactions  . Cephalosporins Diarrhea and Nausea Only    Lightheaded  . Levaquin [Levofloxacin In D5w] Swelling  . Macrodantin [Nitrofurantoin Macrocrystal] Swelling  . Phenothiazines Anaphylaxis and Hives  . Polysorbate Anaphylaxis  . Prednisone Shortness Of Breath  . Buspirone Itching  . Cardura [Doxazosin Mesylate] Itching  . Codeine Itching  . Acyclovir And Related   . Prochlorperazine Other (See Comments)    unknown  . Ranexa [Ranolazine]     Severe drop in BP  . Atorvastatin Hives    Cramping; tolerates Crestor ok  . Ofloxacin Rash  . Other Itching and Rash    "WOOL"= make skin look like it has been burned  . Penicillins Other (  See Comments)    Causes redness all over. Has patient had a PCN reaction causing immediate rash, facial/tongue/throat swelling, SOB or lightheadedness with hypotension: No Has patient had a PCN reaction causing severe rash involving mucus membranes or skin necrosis: No Has patient had a PCN reaction that required hospitalization No Has patient had a PCN reaction occurring within the last 10 years: No If all of the above answers are "NO", then may proceed with Cephalosporin use.   . Pimozide Hives and Itching    Current Outpatient Prescriptions on File Prior to Visit  Medication Sig Dispense Refill  . acetaminophen (TYLENOL) 500 MG tablet Take 500 mg by mouth daily as needed for headache.     . ALPRAZolam (XANAX) 0.25 MG tablet Take 0.25 mg by mouth 2 (two) times daily as needed for anxiety.     [name] Kitchen aspirin 81 MG EC tablet Take 81 mg by mouth every morning.     . Carbamide Peroxide (EAR WAX DROPS OT) 4 drops into both ears once a day on Tue, Sat.  0  . carvedilol (COREG) 25 MG tablet Take 25 mg by mouth 2 (two) times daily with a meal.    . DULoxetine (CYMBALTA) 60 MG capsule Take 60 mg by mouth at bedtime.     . fluticasone  (FLONASE) 50 MCG/ACT nasal spray Place 2 sprays into both nostrils daily as needed for allergies.     . furosemide (LASIX) 20 MG tablet Take 1 tablet (20 mg total) by mouth daily. 90 tablet 3  . gabapentin (NEURONTIN) 300 MG capsule Take 1 capsule (300 mg total) by mouth 3 (three) times daily. 90 capsule 2  . heparin lock flush 100 UNIT/ML SOLN injection Picc Flush with 5 cc normal saline and then 3 cc heparin u-100 daily once a aday    . loperamide (IMODIUM A-D) 2 MG tablet Take 2 mg by mouth 4 (four) times daily as needed for diarrhea or loose stools.    . nitroGLYCERIN (NITROSTAT) 0.4 MG SL tablet Place 0.4 mg under the tongue every 5 (five) minutes as needed for chest pain. Reported on 08/04/2015    . oxyCODONE-acetaminophen (PERCOCET/ROXICET) 5-325 MG tablet Take 1-2 tablets by mouth every 4 (four) hours as needed for moderate pain. Max APAP 3gm/24hours from all sources 360 tablet 0  . pantoprazole (PROTONIX) 40 MG tablet Take 40 mg by mouth every morning.     . potassium chloride (K-DUR,KLOR-CON) 10 MEQ tablet TAKE TWO (2) TABLETS BY MOUTH DAILY. 60 tablet 6  . Probiotic Product (RESTORA) CAPS Take 1 capsule by mouth daily. 30 capsule 0   No current facility-administered medications on file prior to visit.     Review of Systems   \In general she is not complaining of fever or chills.  Skin is not complaining of diaphoresis.  Head ears eyes nose mouth and throat does not complaining of sore throat or visual changes.  Respiratory does not complain of acute shortness of breath or cough.  Cardiac midsternal chest pain as noted above is not currently complaining of radiation although there've been complaints of this at times in the past.  GI is not really complaining of abdominal pain nausea or vomiting.  Muscle skeletal is having significant pain lower back left hip leg area.  Neurologic is not complaining of dizziness headache or syncopal-type feelings.  Psych is not complaining of  depression or anxiety  Immunization History  Administered Date(s) Administered  . Influenza,inj,Quad PF,36+ Mos 02/17/2015  . PPD  Test 10/08/2015   Pertinent  Health Maintenance Due  Topic Date Due  . DEXA SCAN  10/14/2016 (Originally 10/23/1999)  . PNA vac Low Risk Adult (1 of 2 - PCV13) 10/14/2016 (Originally 10/23/1999)  . INFLUENZA VACCINE  11/16/2015   No flowsheet data found. Functional Status Survey:    Filed Vitals:   10/15/15 1148  BP: 131/56  Pulse: 77  Temp: 98 F (36.7 C)  TempSrc: Oral  Height: 5\' 1"  (1.549 m)  Weight: 173 lb (78.472 kg)  SpO2: 97%   Body mass index is 32.7 kg/(m^2). Physical Exam In general this is a pleasant elderly female in no acute distress.  Her skin is warm and dry.  Oropharynx is clear mucous membranes moist.  Chest is clear to auscultation I do not appreciate labored breathing.  Heart is regular rate and rhythm without murmur gallop or rub she has trace lower extremity edema.  Abdomen is soft does not appear to be acutely tender to palpation -- is obese bowel sounds are positive.  Musculoskeletal has a back brace applied-is able to move all extremities 4 but is having significant left leg hip back pain with significant movement or palpation.  I do not note any deformities.  Neurologic is grossly intact her speech is clear no lateralizing findings.  Psych she is alert and oriented pleasant and appropriate Labs reviewed:  Recent Labs  09/17/15 1344  10/01/15 0855  10/03/15 0420 10/04/15 0548 10/06/15 0500  NA 136  < > 137  < > 138 136 138  K 4.4  < > 2.7*  < > 3.5 3.7 3.8  CL 105  < > 93*  < > 97* 97* 97*  CO2 24  < > 34*  < > 35* 32 31  GLUCOSE 110*  < > 105*  < > 100* 100* 105*  BUN 18  < > 6  < > 8 10 13   CREATININE 1.04*  < > 0.84  < > 0.82 0.84 1.04*  CALCIUM 9.0  < > 8.1*  < > 8.5* 8.5* 9.3  MG 2.0  --  1.6*  --   --   --  1.9  < > = values in this interval not displayed.  Recent Labs  02/21/15 1656  AST  19  ALT 14  ALKPHOS 57  BILITOT 0.5  PROT 6.8  ALBUMIN 3.8    Recent Labs  10/03/15 0420 10/04/15 0548 10/13/15 1025  WBC 7.4 7.6 7.3  NEUTROABS 4.3 4.5 3.7  HGB 8.7* 8.8* 9.6*  HCT 27.1* 27.7* 28.2*  MCV 92.2 92.0 91.0  PLT 321 351 472*   Lab Results  Component Value Date   TSH 2.770 11/25/2013   Lab Results  Component Value Date   HGBA1C 5.9* 11/24/2013   Lab Results  Component Value Date   CHOL 181 11/24/2013   HDL 56 11/24/2013   LDLCALC 103* 11/24/2013   TRIG 111 11/24/2013   CHOLHDL 3.2 11/24/2013    Significant Diagnostic Results in last 30 days:  Dg Lumbar Spine 2-3 Views  09/24/2015  CLINICAL DATA:  Intraoperative surgery. FLUOROSCOPY TIME:  42 seconds. Images: 3 EXAM: LUMBAR SPINE - 2-3 VIEW COMPARISON:  None. FINDINGS: By the end of the study, pedicle screws have been placed at L3, L4, and L5. IMPRESSION: Pedicle screw placement as above. Electronically Signed   By: Dorise Bullion III M.D   On: 09/24/2015 14:58   Dg Lumbar Spine 2-3 Views  09/24/2015  CLINICAL DATA:  Surgeries at  L3-4 and L4-5. EXAM: LUMBAR SPINE - 2-3 VIEW COMPARISON:  None. FINDINGS: Surgical instruments are seen posteriorly throughout the study, centered at L3-4 and L4-5. Multilevel degenerative changes. IMPRESSION: Intraoperative films as described above. Electronically Signed   By: Dorise Bullion III M.D   On: 09/24/2015 14:08   Nm Myocar Multi W/spect W/wall Motion / Ef  10/07/2015  CLINICAL DATA:  Chest pain. History of cardiac murmur, hypertension and shortness of breath. Recent lumbar fusion. EXAM: MYOCARDIAL IMAGING WITH SPECT (REST AND PHARMACOLOGIC-STRESS) GATED LEFT VENTRICULAR WALL MOTION STUDY LEFT VENTRICULAR EJECTION FRACTION TECHNIQUE: Standard myocardial SPECT imaging was performed after resting intravenous injection of 10 mCi Tc-63m tetrofosmin. Subsequently, intravenous infusion of Lexiscan was performed under the supervision of the Cardiology staff. At peak effect of the  drug, 30 mCi Tc-5m tetrofosmin was injected intravenously and standard myocardial SPECT imaging was performed. Quantitative gated imaging was also performed to evaluate left ventricular wall motion, and estimate left ventricular ejection fraction. COMPARISON:  Chest CT 07/29/2014. Nuclear medicine myocardial scan 11/25/2013. FINDINGS: Perfusion: There is mildly decreased activity at the apex and within the septum which appears fixed. There is no associated wall motion abnormality, and this attributed to thinning. No reversible perfusion defects. Wall Motion: Normal left ventricular wall motion. No left ventricular dilation. Left Ventricular Ejection Fraction: 66 % End diastolic volume 46 ml End systolic volume 16 ml IMPRESSION: 1. No reversible ischemia or infarction. Apical and septal wall thinning. 2. Normal left ventricular wall motion. 3. Left ventricular ejection fraction 66% 4. Non invasive risk stratification*: Low *2012 Appropriate Use Criteria for Coronary Revascularization Focused Update: J Am Coll Cardiol. [id number]. http://content.airportbarriers.com.aspx?articleid=[email or web address] Electronically Signed   By: Richardean Sale M.D.   On: 10/07/2015 13:32   Dg C-arm 1-60 Min  09/24/2015  CLINICAL DATA:  Surgery.  Spondylolisthesis EXAM: DG C-ARM 61-120 MIN COMPARISON:  None. FINDINGS: Pedicle screws have been placed at L3, L4, and L5 by the end of the study. IMPRESSION: Pedicle screws been placed at L3, L4, L5. Electronically Signed   By: Dorise Bullion III M.D   On: 09/24/2015 14:52    Assessment/Plan  #1 chest pain-she does have a significant history of coronary artery disease-patient is fragile in this regards she is having persistent chest pain she did receive nitroglycerin with some relief-but chest pain is persisting-with her significant history Will send her to the ER for expedient evaluation.  In regards to her significant left leg back pain-she is on Percocet every 4 hours -- did  discuss this with her daughter at bedside--if  patient returns to the facility I suspect she would benefit from a more long acting oxycodone-possibly OxyContin controlled release 10 mg every 12 hours and continue the Percocet for breakthrough pain-again this will have to be addressed if patient returns back to the facility.  This plan was discussed with her daughter at bedside.  Per serial exams before EMS arrived patient remained stable did continue to have chest pain however this appears to be easing off.  [place], [place], [place]

## 2015-10-15 NOTE — H&P (Signed)
History and Physical    [name] [id number] DOB: Oct 20, 1932 DOA: 10/15/2015  PCP: [name]   Patient coming from: SNF  Chief Complaint: Chest pain   HPI: [name] is a 82 y.o. female with medical history significant for hypertension, GERD, depression, CAD with stent, and chronic low back pain with recent surgical decompression who presents from her SNF for evaluation of chest pain that occurred at rest earlier in the day. Patient has history of coronary artery disease with stent placed to the left circumflex in January 2015. She has been managed with a daily aspirin 81 mg and Coreg. She has not tolerated statin previously. She also has chronic low back pain with cord compression and underwent facetotomy and foraminotomies with decompression at L3-4 and L4-5 on 09/25/2015 and had a operative CSF leak repaired on 09/27/2015. She has been staying at an SNF since the surgery, participating with rehabilitation, and doing fairly well generally until the development of acute substernal chest pain with radiation to the left shoulder blade on the morning of her presentation. Nitroglycerin was administered at the SNF with resolution of her symptoms. Patient had noted an associated dyspnea, but no diaphoresis or nausea. She has had similar symptoms previously, but notes that the radiation of the left shoulder is new. Of note, she experienced chest pain following the recent back surgery and underwent nuclear medicine stress testing on 10/07/2015. This was a low risk study with no reversible ischemia or infarction identified and EF was estimated at 66%.  ED Course: Upon arrival to the ED, patient is found to be afebrile, saturating adequately on room air, and with vital signs stable. EKG demonstrates a sinus rhythm with low-voltage QRS and chest x-ray is negative for acute cardiopulmonary disease. Troponin is undetectable and BNP is 59. BMP is notable for mild hyponatremia and  hypochloremia, as well as elevation in serum creatinine to 1.24 with apparent baseline of 0.8. CBC features a hemoglobin of 10.0 and platelet count of 432,000. Hemoglobin appears stable relative to prior measurements. D-dimer returns markedly elevated to 7.09. CTA PE study was obtained and negative for PE or other acute process. Patient was given 2 mg IV morphine for her back pain and a gentle IV fluid hydration in the emergency department. She has remained hemodynamically stable and chest pain-free and will be observed on the telemetry unit for ongoing evaluation and management of chest pain concerning for possible ACS.  Review of Systems:  All other systems reviewed and apart from HPI, are negative.  Past Medical History  Diagnosis Date  . PVD (peripheral vascular disease) ([place])   . Other and unspecified hyperlipidemia   . Panic disorder without agoraphobia   . HTN (hypertension)     Hx of it  . MI (myocardial infarction) ([place]) 2006  . Internal hemorrhoids without mention of complication   . Dysphagia, unspecified(787.20)   . Microscopic colitis 2003  . GERD (gastroesophageal reflux disease)     Hx Schatzki's ring, multiple EGD/ED last 01/06/2004  . Hyperlipemia   . Thyroid disease     recent abnl TSH per pt  . S/P colonoscopy 09/27/2001    internal hemorrhoids, desc colon inflam polyp, SB BX-chronic duodenitis, colitis  . Arthritis   . Shortness of breath   . Heart disease   . Bursitis     left shoulder  . Hyperlipidemia   . Sleep disorder     obstructive  . COPD (chronic obstructive pulmonary disease) ([place])   . Complication  of anesthesia   . PONV (postoperative nausea and vomiting)     'a little nausea"  . Anxiety   . Depression   . Pneumonia 12/2011  . Heart murmur     'a littel'  . Cataract   . Paresthesia     hands, feet  . CHF (congestive heart failure) ([place])   . Headache   . Coronary atherosclerosis of native coronary artery     a. DES to CX, moderately severe  stenosis RCA, mild stenosis LAD 04/2013    Past Surgical History  Procedure Laterality Date  . Tonsillectomy    . Unspecified area, hysterectomy  1972    partial  . Cholecystectomy  1998  . Right knee replacement  2007  . Right leg benign tumor    . Breast lumpectomy  1998    left, benign  . Left hand surgery    . Left rotator cuff surgery    . Nasal sinus surgery    . Bladder tack  06/2010  . Maloney dilation  03/16/2011    Gastritis. No H.pylori on bx. 91F maloney dilation with disruption of  occult cevical esophageal web  . Colonoscopy  03/16/2011    multiple hyperplastic colon polyps, sigmoid diverticulosis, melanosis coli  . Bladder suspension  11/09/2011    Procedure: TRANSVAGINAL TAPE (TVT) PROCEDURE;  Surgeon: Marissa Nestle, MD;  Location: [facility];  Service: Urology;  Laterality: N/A;  . Abdominal hysterectomy    . Appendectomy    . Carpal tunnel release  1989    left  . Anterior and posterior repair      with resection of vagina  . Shoulder surgery Left   . Joint replacement Right 2007  . Lumbar laminectomy/decompression microdiscectomy N/A 10/11/2012    Procedure: LUMBAR LAMINECTOMY/DECOMPRESSION MICRODISCECTOMY 2 LEVELS;  Surgeon: Floyce Stakes, MD;  Location: [facility]UR[facility] ORS;  Service: Neurosurgery;  Laterality: N/A;  L3-4 L4-5 Laminectomy  . Left heart catheterization with coronary angiogram N/A 05/14/2013    Procedure: LEFT HEART CATHETERIZATION WITH CORONARY ANGIOGRAM;  Surgeon: Blane Ohara, MD;  Location: [facility] CATH LAB;  Service: Cardiovascular;  Laterality: N/A;  . Cardiac catheterization    . Cardiac catheterization    . Coronary angioplasty with stent placement    . Colonoscopy with propofol N/A 07/05/2015    RMR:one 5 mm polyp in descending colon  . Esophagogastroduodenoscopy (egd) with propofol N/A 07/05/2015    [id number]  . Esophageal dilation N/A 07/05/2015    Procedure: ESOPHAGEAL DILATION;  Surgeon: Daneil Dolin, MD;  Location: [facility] ENDO SU[facility];   Service: Endoscopy;  Laterality: N/A;  . Biopsy  07/05/2015    Procedure: BIOPSY;  Surgeon: Daneil Dolin, MD;  Location: [facility];  Service: Endoscopy;;  gastric polyp biopsy, ascending colon biopsy  . Lumbar wound debridement N/A 09/27/2015    Procedure: Exploration of Lumbar Wound w/ Repair CSF Leak/Lumbar Drain Placement;  Surgeon: Leeroy Cha, MD;  Location: [facility] NEURO ORS;  Service: Neurosurgery;  Laterality: N/A;     reports that she quit smoking about 13 years ago. Her smoking use included Cigarettes. She started smoking about 67 years ago. She has a 64 pack-year smoking history. She has never used smokeless tobacco. She reports that she does not drink alcohol or use illicit drugs.  Allergies  Allergen Reactions  . Cephalosporins Diarrhea and Nausea Only    Lightheaded  . Levaquin [Levofloxacin In D5w] Swelling  . Macrodantin [Nitrofurantoin Macrocrystal] Swelling  . Phenothiazines Anaphylaxis and  Hives  . Polysorbate Anaphylaxis  . Prednisone Shortness Of Breath  . Buspirone Itching  . Cardura [Doxazosin Mesylate] Itching  . Codeine Itching  . Acyclovir And Related   . Prochlorperazine Other (See Comments)    unknown  . Ranexa [Ranolazine]     Severe drop in BP  . Atorvastatin Hives    Cramping; tolerates Crestor ok  . Ofloxacin Rash  . Other Itching and Rash    "WOOL"= make skin look like it has been burned  . Penicillins Other (See Comments)    Causes redness all over. Has patient had a PCN reaction causing immediate rash, facial/tongue/throat swelling, SOB or lightheadedness with hypotension: No Has patient had a PCN reaction causing severe rash involving mucus membranes or skin necrosis: No Has patient had a PCN reaction that required hospitalization No Has patient had a PCN reaction occurring within the last 10 years: No If all of the above answers are "NO", then may proceed with Cephalosporin use.   . Pimozide Hives and Itching    Family History  Problem  Relation Age of Onset  . Coronary artery disease Other     family Hx-sons  . Cancer Other   . Stroke Other     family Hx  . Hypertension Other     family Hx  . Diabetes Brother   . Heart disease Son     before age 6  . Diabetes Son   . Stroke Daughter 17  . Stroke Mother      Prior to Admission medications   Medication Sig Start Date End Date Taking? Authorizing Provider  acetaminophen (TYLENOL) 500 MG tablet Take 500 mg by mouth daily as needed for headache.    Yes Historical Provider, MD  ALPRAZolam (XANAX) 0.25 MG tablet Take 0.25 mg by mouth 2 (two) times daily as needed for anxiety.    Yes Historical Provider, MD  aspirin 81 MG EC tablet Take 81 mg by mouth every morning.    Yes Historical Provider, MD  Carbamide Peroxide (EAR WAX DROPS OT) 4 drops into both ears once a day on Tue, Sat. 08/03/15  Yes Historical Provider, MD  carvedilol (COREG) 25 MG tablet Take 25 mg by mouth 2 (two) times daily with a meal.   Yes Historical Provider, MD  diclofenac sodium (VOLTAREN) 1 % GEL Apply to residents back as needed   Yes Historical Provider, MD  DULoxetine (CYMBALTA) 60 MG capsule Take 60 mg by mouth at bedtime.    Yes Historical Provider, MD  fluticasone (FLONASE) 50 MCG/ACT nasal spray Place 2 sprays into both nostrils daily as needed for allergies.    Yes Historical Provider, MD  furosemide (LASIX) 20 MG tablet Take 1 tablet (20 mg total) by mouth daily. 05/25/15  Yes Lendon Colonel, NP  gabapentin (NEURONTIN) 300 MG capsule Take 1 capsule (300 mg total) by mouth 3 (three) times daily. 10/08/15  Yes Kevan Ny Ditty, MD  loperamide (IMODIUM A-D) 2 MG tablet Take 2 mg by mouth 4 (four) times daily as needed for diarrhea or loose stools.   Yes Historical Provider, MD  methylPREDNISolone (MEDROL DOSEPAK) 4 MG TBPK tablet Tablets, dose pack; 4 mg; oral once a day   Yes Historical Provider, MD  nitroGLYCERIN (NITROSTAT) 0.4 MG SL tablet Place 0.4 mg under the tongue every 5 (five)  minutes as needed for chest pain. Reported on 08/04/2015   Yes Historical Provider, MD  Nutritional Supplements (BOOST NUTRITIONAL ENERGY PO) Should come on breakfast tray,  Please document intake once a day.   Yes Historical Provider, MD  oxyCODONE-acetaminophen (PERCOCET/ROXICET) 5-325 MG tablet Take 1-2 tablets by mouth every 4 (four) hours as needed for moderate pain. Max APAP 3gm/24hours from all sources 10/13/15  Yes Estill Dooms, MD  pantoprazole (PROTONIX) 40 MG tablet Take 40 mg by mouth every morning.    Yes Historical Provider, MD  potassium chloride (K-DUR,KLOR-CON) 10 MEQ tablet TAKE TWO (2) TABLETS BY MOUTH DAILY. 04/30/15  Yes Herminio Commons, MD  Probiotic Product (RESTORA) CAPS Take 1 capsule by mouth daily. 08/04/15  Yes Mahala Menghini, PA-C    Physical Exam: Filed Vitals:   10/15/15 1330 10/15/15 1400 10/15/15 1430 10/15/15 1530  BP: 141/71 148/61 119/77 130/57  Pulse: 78  76 79  Temp:      TempSrc:      Resp: 15 19 21 16   Weight:      SpO2: 95%  95% 97%    Constitutional: NAD, calm, comfortable Eyes: PERTLA, lids and conjunctivae normal ENMT: Mucous membranes are moist. Posterior pharynx clear of any exudate or lesions.   Neck: normal, supple, no masses, no thyromegaly Respiratory: clear to auscultation bilaterally, no wheezing, no crackles. Normal respiratory effort.   Cardiovascular: S1 & S2 heard, regular rate and rhythm, no significant murmurs / rubs / gallops. No carotid bruits. No significant JVD. Abdomen: No distension, no tenderness, no masses palpated. Bowel sounds normal.  Musculoskeletal: no clubbing / cyanosis. No joint deformity upper and lower extremities. Normal muscle tone.  Skin: no significant rashes, lesions, ulcers. Warm, dry, well-perfused. Neurologic: CN 2-12 grossly intact. Sensation intact, DTR normal. Strength 5/5 in UE's; LE strength testing limited by pain.  Psychiatric: Normal judgment and insight. Alert and oriented x 3. Normal mood and  affect.    Labs on Admission: I have personally reviewed following labs and imaging studies  CBC:  Recent Labs Lab 10/13/15 1025 10/15/15 1303  WBC 7.3 6.0  NEUTROABS 3.7  --   HGB 9.6* 10.0*  HCT 28.2* 29.8*  MCV 91.0 90.0  PLT 472* [id number]*   Basic Metabolic Panel:  Recent Labs Lab 10/15/15 1247  NA 134*  K 4.4  CL 100*  CO2 24  GLUCOSE 197*  BUN 24*  CREATININE 1.24*  CALCIUM 8.8*   GFR: Estimated Creatinine Clearance: 34.5 mL/min (by C-G formula based on Cr of 1.24). Liver Function Tests: No results for input(s): AST, ALT, ALKPHOS, BILITOT, PROT, ALBUMIN in the last 168 hours. No results for input(s): LIPASE, AMYLASE in the last 168 hours. No results for input(s): AMMONIA in the last 168 hours. Coagulation Profile: No results for input(s): INR, PROTIME in the last 168 hours. Cardiac Enzymes:  Recent Labs Lab 10/15/15 1247  TROPONINI <0.03   BNP (last 3 results) No results for input(s): PROBNP in the last 8760 hours. HbA1C: No results for input(s): HGBA1C in the last 72 hours. CBG: No results for input(s): GLUCAP in the last 168 hours. Lipid Profile: No results for input(s): CHOL, HDL, LDLCALC, TRIG, CHOLHDL, LDLDIRECT in the last 72 hours. Thyroid Function Tests: No results for input(s): TSH, T4TOTAL, FREET4, T3FREE, THYROIDAB in the last 72 hours. Anemia Panel:  Recent Labs  10/13/15 1025  VITAMINB12 444  FERRITIN 52  TIBC 382  IRON 30   Urine analysis:    Component Value Date/Time   COLORURINE YELLOW 02/21/2015 2028   APPEARANCEUR CLEAR 02/21/2015 2028   LABSPEC 1.015 02/21/2015 2028   PHURINE 5.5 02/21/2015 2028   GLUCOSEU  NEGATIVE 02/21/2015 2028   HGBUR NEGATIVE 02/21/2015 2028   BILIRUBINUR NEGATIVE 02/21/2015 2028   KETONESUR NEGATIVE 02/21/2015 2028   PROTEINUR NEGATIVE 02/21/2015 2028   UROBILINOGEN 0.2 02/21/2015 2028   NITRITE NEGATIVE 02/21/2015 2028   LEUKOCYTESUR NEGATIVE 02/21/2015 2028   Sepsis  Labs: @LABRCNTIP (procalcitonin:4,lacticidven:4) )No results found for this or any previous visit (from the past 240 hour(s)).   Radiological Exams on Admission: Dg Chest 2 View  10/15/2015  CLINICAL DATA:  Substernal chest pain and pressure since 1140 hours, shortness of breath, increased pain with inspiration, history smoking, pneumonia, hypertension, coronary disease post MI, CHF EXAM: CHEST  2 VIEW COMPARISON:  02/21/2015 FINDINGS: Upper normal heart size. Atherosclerotic calcification aorta. Mediastinal contours and pulmonary vascularity normal. Lungs clear. No pleural effusion or pneumothorax. Bones demineralized. Old healed fracture lateral LEFT sixth rib. IMPRESSION: No acute abnormalities. Aortic atherosclerosis. Electronically Signed   By: Lavonia Dana M.D.   On: 10/15/2015 13:10   Ct Angio Chest Pe W/cm &/or Wo Cm  10/15/2015  CLINICAL DATA:  82 year old female with substernal chest pain and shortness of breath EXAM: CT ANGIOGRAPHY CHEST WITH CONTRAST TECHNIQUE: Multidetector CT imaging of the chest was performed using the standard protocol during bolus administration of intravenous contrast. Multiplanar CT image reconstructions and MIPs were obtained to evaluate the vascular anatomy. CONTRAST:  80 mL Isovue 370 COMPARISON:  Chest x-ray 10/15/2015; prior CT scan of the chest 07/29/2014 FINDINGS: Mediastinum: Unremarkable CT appearance of the thyroid gland. No suspicious mediastinal or hilar adenopathy. No soft tissue mediastinal mass. The thoracic esophagus is unremarkable. Heart/Vascular: Adequate opacification of the pulmonary arteries to the segmental level. Evaluation of the subsegmental branches is limited by respiratory motion artifact. No focal filling defect to suggest acute pulmonary embolus. Normal caliber thoracic aorta. Two vessel anatomy. The right brachiocephalic and left common carotid artery share a common origin. No evidence of dissection. Calcifications present along the course of  the coronary arteries. The heart is normal in size. There is no pericardial effusion. Lungs/Pleura: No evidence of pleural effusion. Evaluation for small pulmonary nodules is limited by excessive respiratory motion artifact. There may be very mild ground-glass attenuation opacity in the lung apices bilaterally. Stable nodularity in the periphery of the right middle lobe just inferior to the minor fissure. Bones/Soft Tissues: No acute fracture or aggressive appearing lytic or blastic osseous lesion. Upper Abdomen: Visualized upper abdominal organs are unremarkable. Review of the MIP images confirms the above findings. IMPRESSION: 1. Negative for acute pulmonary embolus, pneumonia or other acute cardiopulmonary process. 2. Perhaps trace ground-glass attenuation opacity in the lung apices which may reflect subsegmental atelectasis or very early edema. 3. Coronary artery calcifications. Electronically Signed   By: Jacqulynn Cadet M.D.   On: 10/15/2015 15:45    EKG: Independently reviewed. Sinus rhythm, low-voltage QRS  Assessment/Plan  1. Chest pain  - Atypical in its occurrence at rest, worrisome in that it was alleived by NTG at the SNF - Initial troponin <0.03; initial EKG without concerning features  - Ruled-out for PE with CTA - Nuc med stress test (10/07/15) was a low-risk study with no reversible ischemia noted  - Plan to monitor on telemetry for ischemic changes, obtain serial troponin measurements, and repeat EKG in am if not sooner  - ASA 324 mg ordered, continue Coreg, statin precluded by prior intolerance, NTG prn CP  - Manage GERD as below   2. Chronic low back pain with left-sided sciatica  - Status-post lumbar decompression on 09/25/2015  -  Surgery was complicated by CSF leak that required wound exploration, drain (since pulled), and course of vancomycin (completed 6/25)  - Continue current management with prn Voltaren gel and prn Percocet  - [facility] at [facility] following hospital  discharge   3. AKI  - SCr 1.24 on admission, up from apparent baseline of 0.8  - Possibly secondary to recently completed course of vancomycin; SCr had remained at baseline postoperatively  - BUN:SCr ratio mildly elevated and apparent hemoconcentration on CBC  - Holding Lasix, providing gentle IVF hydration  - Received CT contrast on 6/30  - Repeat chem panel in am   4. Hyponatremia  - Serum sodium 134 on admission  - Suspected secondary to mild dehydration  - Anticipate resolution with gentle IVF hydration  - Repeat chem panel in am    5. CAD  - DES to left Cfx in January 2015; also had mild non-obstructive disease at proximal LAD and mod-severe disease in the non-dominant RCA  - Has not tolerated statin in past  - Continue current management with daily ASA 81 and Coreg BID    6. Normocytic anemia, thrombocytosis  - Hgb 10.0 on admission, higher than recent priors and with no sign of active blood-loss  - Platelet count 432,000 on admission, uncertain etiology, was normal postoperatively   7. Depression, anxiety   - Stable, continue current management with Cymbalta and prn Xanax    8. GERD - Stable, no esophagitis or Barrett's on EGD (07/05/2015)  - Continue current management with daily Protonix   10. Chronic diastolic CHF  - Appears mildly dehydrated on admission  - BNP 59.0 on admission  - EF 66% on nuclear med stress test 10/07/15  - Continue Coreg  - Holding Lasix at time of admission given apparent dehydration and mild AKI; resume as appropriate  - Follow daily wts, I/O's    DVT prophylaxis: sq Lovenox  Code Status: Full  Family Communication: Daughter updated at bedside  Disposition Plan: Observe on telemetry  Consults called: None  Admission status: Observation    Vianne Bulls, MD [facility] Pager [phone]  If 7PM-7AM, please contact night-coverage [email or web address] Password [facility]  10/15/2015, 4:52 PM

## 2015-10-15 NOTE — ED Notes (Signed)
MD at bedside. 

## 2015-10-16 DIAGNOSIS — I25119 Atherosclerotic heart disease of native coronary artery with unspecified angina pectoris: Secondary | ICD-10-CM | POA: Diagnosis not present

## 2015-10-16 DIAGNOSIS — R262 Difficulty in walking, not elsewhere classified: Secondary | ICD-10-CM | POA: Diagnosis not present

## 2015-10-16 DIAGNOSIS — G039 Meningitis, unspecified: Secondary | ICD-10-CM | POA: Diagnosis not present

## 2015-10-16 DIAGNOSIS — R079 Chest pain, unspecified: Secondary | ICD-10-CM | POA: Diagnosis not present

## 2015-10-16 DIAGNOSIS — G970 Cerebrospinal fluid leak from spinal puncture: Secondary | ICD-10-CM | POA: Diagnosis not present

## 2015-10-16 DIAGNOSIS — Z48811 Encounter for surgical aftercare following surgery on the nervous system: Secondary | ICD-10-CM | POA: Diagnosis not present

## 2015-10-16 DIAGNOSIS — K219 Gastro-esophageal reflux disease without esophagitis: Secondary | ICD-10-CM | POA: Diagnosis not present

## 2015-10-16 DIAGNOSIS — M5442 Lumbago with sciatica, left side: Secondary | ICD-10-CM | POA: Diagnosis not present

## 2015-10-16 DIAGNOSIS — N179 Acute kidney failure, unspecified: Secondary | ICD-10-CM | POA: Diagnosis not present

## 2015-10-16 DIAGNOSIS — J449 Chronic obstructive pulmonary disease, unspecified: Secondary | ICD-10-CM | POA: Diagnosis not present

## 2015-10-16 DIAGNOSIS — G030 Nonpyogenic meningitis: Secondary | ICD-10-CM | POA: Diagnosis not present

## 2015-10-16 DIAGNOSIS — M6281 Muscle weakness (generalized): Secondary | ICD-10-CM | POA: Diagnosis not present

## 2015-10-16 DIAGNOSIS — Z981 Arthrodesis status: Secondary | ICD-10-CM | POA: Diagnosis not present

## 2015-10-16 DIAGNOSIS — I5032 Chronic diastolic (congestive) heart failure: Secondary | ICD-10-CM | POA: Diagnosis not present

## 2015-10-16 DIAGNOSIS — M1990 Unspecified osteoarthritis, unspecified site: Secondary | ICD-10-CM | POA: Diagnosis not present

## 2015-10-16 DIAGNOSIS — Z87891 Personal history of nicotine dependence: Secondary | ICD-10-CM | POA: Diagnosis not present

## 2015-10-16 DIAGNOSIS — D500 Iron deficiency anemia secondary to blood loss (chronic): Secondary | ICD-10-CM | POA: Diagnosis not present

## 2015-10-16 DIAGNOSIS — Z79899 Other long term (current) drug therapy: Secondary | ICD-10-CM | POA: Diagnosis not present

## 2015-10-16 DIAGNOSIS — R488 Other symbolic dysfunctions: Secondary | ICD-10-CM | POA: Diagnosis not present

## 2015-10-16 DIAGNOSIS — I252 Old myocardial infarction: Secondary | ICD-10-CM | POA: Diagnosis not present

## 2015-10-16 DIAGNOSIS — I739 Peripheral vascular disease, unspecified: Secondary | ICD-10-CM | POA: Diagnosis not present

## 2015-10-16 DIAGNOSIS — I110 Hypertensive heart disease with heart failure: Secondary | ICD-10-CM | POA: Diagnosis not present

## 2015-10-16 DIAGNOSIS — I509 Heart failure, unspecified: Secondary | ICD-10-CM | POA: Diagnosis not present

## 2015-10-16 DIAGNOSIS — I480 Paroxysmal atrial fibrillation: Secondary | ICD-10-CM | POA: Diagnosis not present

## 2015-10-16 DIAGNOSIS — I209 Angina pectoris, unspecified: Secondary | ICD-10-CM | POA: Diagnosis not present

## 2015-10-16 DIAGNOSIS — I2510 Atherosclerotic heart disease of native coronary artery without angina pectoris: Secondary | ICD-10-CM | POA: Diagnosis not present

## 2015-10-16 DIAGNOSIS — I5033 Acute on chronic diastolic (congestive) heart failure: Secondary | ICD-10-CM | POA: Diagnosis not present

## 2015-10-16 DIAGNOSIS — G960 Cerebrospinal fluid leak: Secondary | ICD-10-CM | POA: Diagnosis not present

## 2015-10-16 DIAGNOSIS — M25552 Pain in left hip: Secondary | ICD-10-CM | POA: Diagnosis not present

## 2015-10-16 DIAGNOSIS — F329 Major depressive disorder, single episode, unspecified: Secondary | ICD-10-CM | POA: Diagnosis not present

## 2015-10-16 DIAGNOSIS — F411 Generalized anxiety disorder: Secondary | ICD-10-CM | POA: Diagnosis not present

## 2015-10-16 DIAGNOSIS — Z7982 Long term (current) use of aspirin: Secondary | ICD-10-CM | POA: Diagnosis not present

## 2015-10-16 DIAGNOSIS — R293 Abnormal posture: Secondary | ICD-10-CM | POA: Diagnosis not present

## 2015-10-16 DIAGNOSIS — E785 Hyperlipidemia, unspecified: Secondary | ICD-10-CM | POA: Diagnosis not present

## 2015-10-16 DIAGNOSIS — R202 Paresthesia of skin: Secondary | ICD-10-CM | POA: Diagnosis not present

## 2015-10-16 DIAGNOSIS — M791 Myalgia: Secondary | ICD-10-CM | POA: Diagnosis not present

## 2015-10-16 DIAGNOSIS — R0789 Other chest pain: Secondary | ICD-10-CM | POA: Diagnosis not present

## 2015-10-16 DIAGNOSIS — Z96651 Presence of right artificial knee joint: Secondary | ICD-10-CM | POA: Diagnosis not present

## 2015-10-16 DIAGNOSIS — M4316 Spondylolisthesis, lumbar region: Secondary | ICD-10-CM | POA: Diagnosis not present

## 2015-10-16 DIAGNOSIS — F410 Panic disorder [episodic paroxysmal anxiety] without agoraphobia: Secondary | ICD-10-CM | POA: Diagnosis not present

## 2015-10-16 DIAGNOSIS — R279 Unspecified lack of coordination: Secondary | ICD-10-CM | POA: Diagnosis not present

## 2015-10-16 LAB — TROPONIN I

## 2015-10-16 LAB — BASIC METABOLIC PANEL
ANION GAP: 9 (ref 5–15)
BUN: 24 mg/dL — ABNORMAL HIGH (ref 6–20)
CALCIUM: 9.2 mg/dL (ref 8.9–10.3)
CO2: 28 mmol/L (ref 22–32)
Chloride: 100 mmol/L — ABNORMAL LOW (ref 101–111)
Creatinine, Ser: 1.14 mg/dL — ABNORMAL HIGH (ref 0.44–1.00)
GFR calc Af Amer: 51 mL/min — ABNORMAL LOW (ref >60)
GFR, EST NON AFRICAN AMERICAN: 44 mL/min — AB (ref >60)
GLUCOSE: 138 mg/dL — AB (ref 65–99)
Potassium: 4.3 mmol/L (ref 3.5–5.1)
SODIUM: 137 mmol/L (ref 135–145)

## 2015-10-16 NOTE — Discharge Summary (Signed)
Physician Discharge Summary  [name] [id number] DOB: [birth year] DOA: 10/15/2015  PCP: [name]  Admit date: 10/15/2015 Discharge date: 10/16/2015  Time spent: 45 minutes  Recommendations for Outpatient Follow-up:  -Will be discharged back to SNF today.   Discharge Diagnoses:  Principal Problem:   Chest pain at rest Active Problems:   Chronic diastolic heart failure (HCC)   Major depression (HCC)   Acute kidney failure (HCC)   Normocytic anemia   Coronary artery disease due to lipid rich plaque   Chronic low back pain   Hyponatremia   Hyperglycemia   Thrombocytosis (HCC)   Chest pain   Discharge Condition: Stable and improved  Filed Weights   10/15/15 1238 10/15/15 1804  Weight: 79.379 kg (175 lb) 78.7 kg (173 lb 8 oz)    History of present illness:  As per Dr. Myna Hidalgo on 6/30: [name] is a 82 y.o. female with medical history significant for hypertension, GERD, depression, CAD with stent, and chronic low back pain with recent surgical decompression who presents from her SNF for evaluation of chest pain that occurred at rest earlier in the day. Patient has history of coronary artery disease with stent placed to the left circumflex in January 2015. She has been managed with a daily aspirin 81 mg and Coreg. She has not tolerated statin previously. She also has chronic low back pain with cord compression and underwent facetotomy and foraminotomies with decompression at L3-4 and L4-5 on 09/25/2015 and had a operative CSF leak repaired on 09/27/2015. She has been staying at an SNF since the surgery, participating with rehabilitation, and doing fairly well generally until the development of acute substernal chest pain with radiation to the left shoulder blade on the morning of her presentation. Nitroglycerin was administered at the SNF with resolution of her symptoms. Patient had noted an associated dyspnea, but no diaphoresis or nausea. She has had similar  symptoms previously, but notes that the radiation of the left shoulder is new. Of note, she experienced chest pain following the recent back surgery and underwent nuclear medicine stress testing on 10/07/2015. This was a low risk study with no reversible ischemia or infarction identified and EF was estimated at 66%.  ED Course: Upon arrival to the ED, patient is found to be afebrile, saturating adequately on room air, and with vital signs stable. EKG demonstrates a sinus rhythm with low-voltage QRS and chest x-ray is negative for acute cardiopulmonary disease. Troponin is undetectable and BNP is 59. BMP is notable for mild hyponatremia and hypochloremia, as well as elevation in serum creatinine to 1.24 with apparent baseline of 0.8. CBC features a hemoglobin of 10.0 and platelet count of 432,000. Hemoglobin appears stable relative to prior measurements. D-dimer returns markedly elevated to 7.09. CTA PE study was obtained and negative for PE or other acute process. Patient was given 2 mg IV morphine for her back pain and a gentle IV fluid hydration in the emergency department. She has remained hemodynamically stable and chest pain-free and will be observed on the telemetry unit for ongoing evaluation and management of chest pain concerning for possible ACS.  Hospital Course:   Chest pain -3 sets of troponins were negative, EKG not concerning for acute ischemic abnormalities. -Nuclear medicine stress test on 10/07/15 was a low risk study with no reversible ischemia. -CT angiogram of the chest without evidence for PE. -Believe she can safely discharge home today without further intervention.  Rest of chronic conditions are stable  Procedures:  None   Consultations:  None  Discharge Instructions  Discharge Instructions    Diet - low sodium heart healthy    Complete by:  As directed      Increase activity slowly    Complete by:  As directed             Medication List    TAKE these  medications        acetaminophen 500 MG tablet  Commonly known as:  TYLENOL  Take 500 mg by mouth daily as needed for headache.     ALPRAZolam 0.25 MG tablet  Commonly known as:  XANAX  Take 0.25 mg by mouth 2 (two) times daily as needed for anxiety.     aspirin 81 MG EC tablet  Take 81 mg by mouth every morning.     BOOST NUTRITIONAL ENERGY PO  Should come on breakfast tray, Please document intake once a day.     carvedilol 25 MG tablet  Commonly known as:  COREG  Take 25 mg by mouth 2 (two) times daily with a meal.     diclofenac sodium 1 % Gel  Commonly known as:  VOLTAREN  Apply to residents back as needed     DULoxetine 60 MG capsule  Commonly known as:  CYMBALTA  Take 60 mg by mouth at bedtime.     EAR WAX DROPS OT  4 drops into both ears once a day on Tue, Sat.     fluticasone 50 MCG/ACT nasal spray  Commonly known as:  FLONASE  Place 2 sprays into both nostrils daily as needed for allergies.     furosemide 20 MG tablet  Commonly known as:  LASIX  Take 1 tablet (20 mg total) by mouth daily.     gabapentin 300 MG capsule  Commonly known as:  NEURONTIN  Take 1 capsule (300 mg total) by mouth 3 (three) times daily.     IMODIUM A-D 2 MG tablet  Generic drug:  loperamide  Take 2 mg by mouth 4 (four) times daily as needed for diarrhea or loose stools.     methylPREDNISolone 4 MG Tbpk tablet  Commonly known as:  MEDROL DOSEPAK  Tablets, dose pack; 4 mg; oral once a day     nitroGLYCERIN 0.4 MG SL tablet  Commonly known as:  NITROSTAT  Place 0.4 mg under the tongue every 5 (five) minutes as needed for chest pain. Reported on 08/04/2015     oxyCODONE-acetaminophen 5-325 MG tablet  Commonly known as:  PERCOCET/ROXICET  Take 1-2 tablets by mouth every 4 (four) hours as needed for moderate pain. Max APAP 3gm/24hours from all sources     pantoprazole 40 MG tablet  Commonly known as:  PROTONIX  Take 40 mg by mouth every morning.     potassium chloride 10 MEQ  tablet  Commonly known as:  K-DUR,KLOR-CON  TAKE TWO (2) TABLETS BY MOUTH DAILY.     RESTORA Caps  Take 1 capsule by mouth daily.       Allergies  Allergen Reactions  . Cephalosporins Diarrhea and Nausea Only    Lightheaded  . Levaquin [Levofloxacin In D5w] Swelling  . Macrodantin [Nitrofurantoin Macrocrystal] Swelling  . Phenothiazines Anaphylaxis and Hives  . Polysorbate Anaphylaxis  . Prednisone Shortness Of Breath  . Buspirone Itching  . Cardura [Doxazosin Mesylate] Itching  . Codeine Itching  . Acyclovir And Related   . Prochlorperazine Other (See Comments)    unknown  . Ranexa [Ranolazine]  Severe drop in BP  . Atorvastatin Hives    Cramping; tolerates Crestor ok  . Ofloxacin Rash  . Other Itching and Rash    "WOOL"= make skin look like it has been burned  . Penicillins Other (See Comments)    Causes redness all over. Has patient had a PCN reaction causing immediate rash, facial/tongue/throat swelling, SOB or lightheadedness with hypotension: No Has patient had a PCN reaction causing severe rash involving mucus membranes or skin necrosis: No Has patient had a PCN reaction that required hospitalization No Has patient had a PCN reaction occurring within the last 10 years: No If all of the above answers are "NO", then may proceed with Cephalosporin use.   . Pimozide Hives and Itching       Follow-up Information    Follow up with [facility], PA-C. Schedule an appointment as soon as possible for a visit in 2 weeks.   Specialty:  Physician Assistant   Contact information:   [place] [id number]        The results of significant diagnostics from this hospitalization (including imaging, microbiology, ancillary and laboratory) are listed below for reference.    Significant Diagnostic Studies: Dg Chest 2 View  10/15/2015  CLINICAL DATA:  Substernal chest pain and pressure since 1140 hours, shortness of breath, increased pain with inspiration,  history smoking, pneumonia, hypertension, coronary disease post MI, CHF EXAM: CHEST  2 VIEW COMPARISON:  02/21/2015 FINDINGS: Upper normal heart size. Atherosclerotic calcification aorta. Mediastinal contours and pulmonary vascularity normal. Lungs clear. No pleural effusion or pneumothorax. Bones demineralized. Old healed fracture lateral LEFT sixth rib. IMPRESSION: No acute abnormalities. Aortic atherosclerosis. Electronically Signed   By: Lavonia Dana M.D.   On: 10/15/2015 13:10   Dg Lumbar Spine 2-3 Views  09/24/2015  CLINICAL DATA:  Intraoperative surgery. FLUOROSCOPY TIME:  42 seconds. Images: 3 EXAM: LUMBAR SPINE - 2-3 VIEW COMPARISON:  None. FINDINGS: By the end of the study, pedicle screws have been placed at L3, L4, and L5. IMPRESSION: Pedicle screw placement as above. Electronically Signed   By: Dorise Bullion III M.D   On: 09/24/2015 14:58   Dg Lumbar Spine 2-3 Views  09/24/2015  CLINICAL DATA:  Surgeries at L3-4 and L4-5. EXAM: LUMBAR SPINE - 2-3 VIEW COMPARISON:  None. FINDINGS: Surgical instruments are seen posteriorly throughout the study, centered at L3-4 and L4-5. Multilevel degenerative changes. IMPRESSION: Intraoperative films as described above. Electronically Signed   By: Dorise Bullion III M.D   On: 09/24/2015 14:08   Ct Angio Chest Pe W/cm &/or Wo Cm  10/15/2015  CLINICAL DATA:  82 year old female with substernal chest pain and shortness of breath EXAM: CT ANGIOGRAPHY CHEST WITH CONTRAST TECHNIQUE: Multidetector CT imaging of the chest was performed using the standard protocol during bolus administration of intravenous contrast. Multiplanar CT image reconstructions and MIPs were obtained to evaluate the vascular anatomy. CONTRAST:  80 mL Isovue 370 COMPARISON:  Chest x-ray 10/15/2015; prior CT scan of the chest 07/29/2014 FINDINGS: Mediastinum: Unremarkable CT appearance of the thyroid gland. No suspicious mediastinal or hilar adenopathy. No soft tissue mediastinal mass. The thoracic  esophagus is unremarkable. Heart/Vascular: Adequate opacification of the pulmonary arteries to the segmental level. Evaluation of the subsegmental branches is limited by respiratory motion artifact. No focal filling defect to suggest acute pulmonary embolus. Normal caliber thoracic aorta. Two vessel anatomy. The right brachiocephalic and left common carotid artery share a common origin. No evidence of dissection. Calcifications present along the  course of the coronary arteries. The heart is normal in size. There is no pericardial effusion. Lungs/Pleura: No evidence of pleural effusion. Evaluation for small pulmonary nodules is limited by excessive respiratory motion artifact. There may be very mild ground-glass attenuation opacity in the lung apices bilaterally. Stable nodularity in the periphery of the right middle lobe just inferior to the minor fissure. Bones/Soft Tissues: No acute fracture or aggressive appearing lytic or blastic osseous lesion. Upper Abdomen: Visualized upper abdominal organs are unremarkable. Review of the MIP images confirms the above findings. IMPRESSION: 1. Negative for acute pulmonary embolus, pneumonia or other acute cardiopulmonary process. 2. Perhaps trace ground-glass attenuation opacity in the lung apices which may reflect subsegmental atelectasis or very early edema. 3. Coronary artery calcifications. Electronically Signed   By: Jacqulynn Cadet M.D.   On: 10/15/2015 15:45   Nm Myocar Multi W/spect W/wall Motion / Ef  10/07/2015  CLINICAL DATA:  Chest pain. History of cardiac murmur, hypertension and shortness of breath. Recent lumbar fusion. EXAM: MYOCARDIAL IMAGING WITH SPECT (REST AND PHARMACOLOGIC-STRESS) GATED LEFT VENTRICULAR WALL MOTION STUDY LEFT VENTRICULAR EJECTION FRACTION TECHNIQUE: Standard myocardial SPECT imaging was performed after resting intravenous injection of 10 mCi Tc-68m tetrofosmin. Subsequently, intravenous infusion of Lexiscan was performed under the  supervision of the Cardiology staff. At peak effect of the drug, 30 mCi Tc-67m tetrofosmin was injected intravenously and standard myocardial SPECT imaging was performed. Quantitative gated imaging was also performed to evaluate left ventricular wall motion, and estimate left ventricular ejection fraction. COMPARISON:  Chest CT 07/29/2014. Nuclear medicine myocardial scan 11/25/2013. FINDINGS: Perfusion: There is mildly decreased activity at the apex and within the septum which appears fixed. There is no associated wall motion abnormality, and this attributed to thinning. No reversible perfusion defects. Wall Motion: Normal left ventricular wall motion. No left ventricular dilation. Left Ventricular Ejection Fraction: 66 % End diastolic volume 46 ml End systolic volume 16 ml IMPRESSION: 1. No reversible ischemia or infarction. Apical and septal wall thinning. 2. Normal left ventricular wall motion. 3. Left ventricular ejection fraction 66% 4. Non invasive risk stratification*: Low *2012 Appropriate Use Criteria for Coronary Revascularization Focused Update: J Am Coll Cardiol. [id number]. http://content.airportbarriers.com.aspx?articleid=1201161 Electronically Signed   By: Richardean Sale M.D.   On: 10/07/2015 13:32   Dg C-arm 1-60 Min  09/24/2015  CLINICAL DATA:  Surgery.  Spondylolisthesis EXAM: DG C-ARM 61-120 MIN COMPARISON:  None. FINDINGS: Pedicle screws have been placed at L3, L4, and L5 by the end of the study. IMPRESSION: Pedicle screws been placed at L3, L4, L5. Electronically Signed   By: Dorise Bullion III M.D   On: 09/24/2015 14:52    Microbiology: Recent Results (from the past 240 hour(s))  MRSA PCR Screening     Status: None   Collection Time: 10/15/15  7:02 PM  Result Value Ref Range Status   MRSA by PCR NEGATIVE NEGATIVE Final    Comment:        The GeneXpert MRSA Assay (FDA approved for NASAL specimens only), is one component of a comprehensive MRSA  colonization surveillance program. It is not intended to diagnose MRSA infection nor to guide or monitor treatment for MRSA infections.      Labs: Basic Metabolic Panel:  Recent Labs Lab 10/15/15 1247 10/16/15 0619  NA 134* 137  K 4.4 4.3  CL 100* 100*  CO2 24 28  GLUCOSE 197* 138*  BUN 24* 24*  CREATININE 1.24* 1.14*  CALCIUM 8.8* 9.2   Liver Function Tests: No  results for input(s): AST, ALT, ALKPHOS, BILITOT, PROT, ALBUMIN in the last 168 hours. No results for input(s): LIPASE, AMYLASE in the last 168 hours. No results for input(s): AMMONIA in the last 168 hours. CBC:  Recent Labs Lab 10/13/15 1025 10/15/15 1303  WBC 7.3 6.0  NEUTROABS 3.7  --   HGB 9.6* 10.0*  HCT 28.2* 29.8*  MCV 91.0 90.0  PLT 472* 432*   Cardiac Enzymes:  Recent Labs Lab 10/15/15 1247 10/15/15 1658 10/15/15 2236 10/16/15 0619  TROPONINI <0.03 <0.03 <0.03 <0.03   BNP: BNP (last 3 results)  Recent Labs  11/01/14 1920 10/15/15 1247  BNP 58.0 59.0    ProBNP (last 3 results) No results for input(s): PROBNP in the last 8760 hours.  CBG: No results for input(s): GLUCAP in the last 168 hours.     SignedLelon Frohlich  [facility] Pager: [phone] 10/16/2015, 12:05 PM

## 2015-10-20 ENCOUNTER — Non-Acute Institutional Stay (SKILLED_NURSING_FACILITY): Payer: Medicare Other | Admitting: Internal Medicine

## 2015-10-20 ENCOUNTER — Encounter: Payer: Self-pay | Admitting: Internal Medicine

## 2015-10-20 DIAGNOSIS — M4316 Spondylolisthesis, lumbar region: Secondary | ICD-10-CM | POA: Diagnosis not present

## 2015-10-20 DIAGNOSIS — R079 Chest pain, unspecified: Secondary | ICD-10-CM

## 2015-10-20 NOTE — Patient Instructions (Signed)
See Plan under Dx No new orders

## 2015-10-20 NOTE — Progress Notes (Signed)
    Facility Location: [place] Room Number: 133-P    This is a nursing facility follow up for  [place] readmission within 30 days.  Interim medical record and care since last [place] visit was updated with review of diagnostic studies and change in clinical status since last visit were documented.   HPI: The patient was admitted to 6/30- 10/16/15 with a complaint of chest pain at rest. Pain radiated to the left shoulder blade. NTG did resolve the chest pain. There was dyspnea but no associated diaphoresis or nausea. This was in the context of history coronary artery disease for which she had stenting to the left circumflex in January 2015. She has history of intolerance to statin. Nuclear medicine stress testing 10/07/15 prior to her recent back surgery revealed low risk with no evidence of reversible ischemia or infarction. There were no acute changes on EKG. Troponin was undetectable serially 4. D-dimer was markedly elevated at 7.09. CTA pulmonary emboli study was negative. She was observed on telemetry unit prior to discharge back to [facility].  Review of systems: Her major complaint now is intermittent radicular pain in the left sacral/buttocks area which radiates to the left inguinal area into the L4 distribution of the left lower extremity. Denied are chest pain, palpitations, claudication, edema, paroxysmal nocturnal dyspnea, exertional dyspnea, significant cough, or hemoptysis at this time. She has had minor rectal bleeding attributed to hemorrhoids. She denies frank melena or active GU symptoms of dysuria, pyuria, hematuria.  Physical exam:  Pertinent or positive findings: She appears deconditioned. She has complete dentures. Abdomen is protuberant.  There is a well-healed operative scar with granuloma formation over the right knee. Straight leg raising is negative in the left lower extremity. Pedal pulses are decreased. General appearance:Adequately  nourished; no acute distress , increased work of breathing is present.   Lymphatic: No lymphadenopathy about the head, neck, axilla . Eyes: No conjunctival inflammation or lid edema is present. There is no scleral icterus. Ears:  External ear exam shows no significant lesions or deformities.   Nose:  External nasal examination shows no deformity or inflammation. Nasal mucosa are pink and moist without lesions ,exudates Oral exam: lips and gums are healthy appearing.There is no oropharyngeal erythema or exudate . Neck:  No thyromegaly, masses, tenderness noted.    Heart:  Normal rate and regular rhythm. S1 and S2 normal without gallop, murmur, click, rub .  Lungs:Chest clear to auscultation without wheezes, rhonchi,rales , rubs. Abdomen:Bowel sounds are normal. Abdomen is soft and nontender with no organomegaly, hernias,masses. GU: deferred. Extremities:  No cyanosis, clubbing,edema  Neurologic exam : Strength unequal  in lower extremities, slightly weaker on the left. Balance,Rhomberg,finger to nose testing could not be completed due to clinical state Deep tendon reflexes are equal Skin: Warm & dry w/o tenting. No significant lesions or rash.   #1 Chest pain has resolved  #2  active issue at this time is a lumbar radiculopathy which is chronic and intermittent despite gabapentin 300 mg 3 times a day. Follow-up of this chronic, recurrent issue will need to be completed with Dr Joya Salm, her neurosurgeon.

## 2015-10-20 NOTE — Assessment & Plan Note (Signed)
As per Dr. Joya Salm, NS

## 2015-10-20 NOTE — Assessment & Plan Note (Addendum)
She is on carvedilol ,a beta blocker Consider long-acting nitroglycerin if symptoms recur

## 2015-10-27 ENCOUNTER — Non-Acute Institutional Stay (SKILLED_NURSING_FACILITY): Payer: Medicare Other | Admitting: Internal Medicine

## 2015-10-27 ENCOUNTER — Encounter: Payer: Self-pay | Admitting: Internal Medicine

## 2015-10-27 ENCOUNTER — Emergency Department (HOSPITAL_COMMUNITY): Payer: Medicare Other

## 2015-10-27 ENCOUNTER — Emergency Department (HOSPITAL_COMMUNITY)
Admission: EM | Admit: 2015-10-27 | Discharge: 2015-10-27 | Disposition: A | Discharge status: Home / Self Care | Payer: Medicare Other | Attending: Emergency Medicine | Admitting: Emergency Medicine

## 2015-10-27 ENCOUNTER — Encounter (HOSPITAL_COMMUNITY): Payer: Self-pay

## 2015-10-27 DIAGNOSIS — I252 Old myocardial infarction: Secondary | ICD-10-CM | POA: Diagnosis not present

## 2015-10-27 DIAGNOSIS — M791 Myalgia: Secondary | ICD-10-CM | POA: Insufficient documentation

## 2015-10-27 DIAGNOSIS — Z7982 Long term (current) use of aspirin: Secondary | ICD-10-CM | POA: Insufficient documentation

## 2015-10-27 DIAGNOSIS — R200 Anesthesia of skin: Secondary | ICD-10-CM

## 2015-10-27 DIAGNOSIS — J449 Chronic obstructive pulmonary disease, unspecified: Secondary | ICD-10-CM | POA: Insufficient documentation

## 2015-10-27 DIAGNOSIS — I110 Hypertensive heart disease with heart failure: Secondary | ICD-10-CM | POA: Insufficient documentation

## 2015-10-27 DIAGNOSIS — M4316 Spondylolisthesis, lumbar region: Secondary | ICD-10-CM

## 2015-10-27 DIAGNOSIS — I739 Peripheral vascular disease, unspecified: Secondary | ICD-10-CM | POA: Insufficient documentation

## 2015-10-27 DIAGNOSIS — G039 Meningitis, unspecified: Secondary | ICD-10-CM | POA: Diagnosis not present

## 2015-10-27 DIAGNOSIS — G9600 Cerebrospinal fluid leak, unspecified: Secondary | ICD-10-CM

## 2015-10-27 DIAGNOSIS — I2510 Atherosclerotic heart disease of native coronary artery without angina pectoris: Secondary | ICD-10-CM | POA: Insufficient documentation

## 2015-10-27 DIAGNOSIS — M1990 Unspecified osteoarthritis, unspecified site: Secondary | ICD-10-CM | POA: Insufficient documentation

## 2015-10-27 DIAGNOSIS — I509 Heart failure, unspecified: Secondary | ICD-10-CM | POA: Insufficient documentation

## 2015-10-27 DIAGNOSIS — R202 Paresthesia of skin: Secondary | ICD-10-CM

## 2015-10-27 DIAGNOSIS — Z87891 Personal history of nicotine dependence: Secondary | ICD-10-CM | POA: Insufficient documentation

## 2015-10-27 DIAGNOSIS — M5442 Lumbago with sciatica, left side: Secondary | ICD-10-CM

## 2015-10-27 DIAGNOSIS — Z981 Arthrodesis status: Secondary | ICD-10-CM | POA: Diagnosis not present

## 2015-10-27 DIAGNOSIS — Z79899 Other long term (current) drug therapy: Secondary | ICD-10-CM | POA: Insufficient documentation

## 2015-10-27 DIAGNOSIS — E785 Hyperlipidemia, unspecified: Secondary | ICD-10-CM | POA: Insufficient documentation

## 2015-10-27 DIAGNOSIS — M25552 Pain in left hip: Secondary | ICD-10-CM | POA: Diagnosis not present

## 2015-10-27 DIAGNOSIS — F329 Major depressive disorder, single episode, unspecified: Secondary | ICD-10-CM | POA: Insufficient documentation

## 2015-10-27 LAB — URINALYSIS, ROUTINE W REFLEX MICROSCOPIC
BILIRUBIN URINE: NEGATIVE
Glucose, UA: NEGATIVE mg/dL
KETONES UR: NEGATIVE mg/dL
NITRITE: NEGATIVE
PROTEIN: NEGATIVE mg/dL
Specific Gravity, Urine: 1.01 (ref 1.005–1.030)
pH: 5.5 (ref 5.0–8.0)

## 2015-10-27 LAB — URINE MICROSCOPIC-ADD ON

## 2015-10-27 MED ORDER — DIAZEPAM 5 MG PO TABS: 5.0000 mg | ORAL_TABLET | Freq: Two times a day (BID) | ORAL | Status: DC

## 2015-10-27 MED ORDER — DICLOFENAC SODIUM 1 % TD GEL: 2.0000 g | Freq: Four times a day (QID) | TRANSDERMAL | Status: DC

## 2015-10-27 MED ORDER — DIAZEPAM 5 MG PO TABS
ORAL_TABLET | ORAL | Status: AC
  Filled 2015-10-27: qty 1

## 2015-10-27 MED ORDER — MORPHINE SULFATE (PF) 4 MG/ML IV SOLN
4.0000 mg | Freq: Once | INTRAVENOUS | Status: AC
  Administered 2015-10-27: 4 mg via INTRAMUSCULAR
  Filled 2015-10-27: qty 1

## 2015-10-27 MED ORDER — DIAZEPAM 5 MG PO TABS
5.0000 mg | ORAL_TABLET | Freq: Once | ORAL | Status: AC
  Administered 2015-10-27: 5 mg via ORAL

## 2015-10-27 NOTE — ED Notes (Signed)
Pt here from [facility] for evaluation of left leg pain and numbness to three of her toes on the left foot. Pt states she had back surgery September 25, 2015

## 2015-10-27 NOTE — ED Notes (Signed)
Report given to [facility] at [facility]

## 2015-10-27 NOTE — ED Provider Notes (Signed)
CSN: [id number]     Arrival date & time 10/27/15  1152 History  By signing my name below, I, Rayna Sexton, attest that this documentation has been prepared under the direction and in the presence of Isla Pence, MD. Electronically Signed: Rayna Sexton, ED Scribe. 10/27/2015. 12:05 PM.   Chief Complaint  Patient presents with  . Leg Pain   The history is provided by the patient. No language interpreter was used.   HPI Comments: [name] is a 82 y.o. 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 Pt reports a radiation of her pain down her LLE as well as intermittent numbness in the toes of her left foot. She additionally notes a small "knot" to her left buttock with overlying tenderness that worsens with palpation. She denies any other associated symptoms at this time.    Past Medical History  Diagnosis Date  . PVD (peripheral vascular disease) ([place])   . Other and unspecified hyperlipidemia   . Panic disorder without agoraphobia   . HTN (hypertension)     Hx of it  . MI (myocardial infarction) ([place]) 2006  . Internal hemorrhoids without mention of complication   . Dysphagia, unspecified(787.20)   . Microscopic colitis 2003  . GERD (gastroesophageal reflux disease)     Hx Schatzki's ring, multiple EGD/ED last 01/06/2004  . Hyperlipemia   . Thyroid disease     recent abnl TSH per pt  . S/P colonoscopy 09/27/2001    internal hemorrhoids, desc colon inflam polyp, SB BX-chronic duodenitis, colitis  . Arthritis   . Shortness of breath   . Heart disease   . Bursitis     left shoulder  . Hyperlipidemia   . Sleep disorder     obstructive  . COPD (chronic obstructive pulmonary disease) ([place])   . Complication of anesthesia   . PONV (postoperative nausea and vomiting)     'a little  nausea"  . Anxiety   . Depression   . Pneumonia 12/2011  . Heart murmur     'a littel'  . Cataract   . Paresthesia     hands, feet  . CHF (congestive heart failure) ([place])   . Headache   . Coronary atherosclerosis of native coronary artery     a. DES to CX, moderately severe stenosis RCA, mild stenosis LAD 04/2013   Past Surgical History  Procedure Laterality Date  . Tonsillectomy    . Unspecified area, hysterectomy  1972    partial  . Cholecystectomy  1998  . Right knee replacement  2007  . Right leg benign tumor    . Breast lumpectomy  1998    left, benign  . Left hand surgery    . Left rotator cuff surgery    . Nasal sinus surgery    . Bladder tack  06/2010  . Maloney dilation  03/16/2011    Gastritis. No H.pylori on bx. 38F maloney dilation with disruption of  occult cevical esophageal web  . Colonoscopy  03/16/2011    multiple hyperplastic colon polyps, sigmoid diverticulosis, melanosis coli  . Bladder suspension  11/09/2011    Procedure: TRANSVAGINAL TAPE (TVT) PROCEDURE;  Surgeon: Marissa Nestle, MD;  Location: [facility];  Service: Urology;  Laterality: N/A;  . Abdominal hysterectomy    . Appendectomy    . Carpal tunnel release  1989    left  . Anterior and posterior repair      with resection of vagina  . Shoulder surgery Left   . Joint replacement Right 2007  . Lumbar laminectomy/decompression microdiscectomy N/A 10/11/2012    Procedure: LUMBAR LAMINECTOMY/DECOMPRESSION MICRODISCECTOMY 2 LEVELS;  Surgeon: Floyce Stakes, MD;  Location: [facility];  Service: Neurosurgery;  Laterality: N/A;  L3-4 L4-5 Laminectomy  . Left heart catheterization with coronary angiogram N/A 05/14/2013    Procedure: LEFT HEART CATHETERIZATION WITH CORONARY ANGIOGRAM;  Surgeon: Blane Ohara, MD;  Location: [facility] CATH LAB;  Service: Cardiovascular;  Laterality: N/A;  . Cardiac catheterization    . Cardiac catheterization    . Coronary angioplasty with stent placement    . Colonoscopy  with propofol N/A 07/05/2015    RMR:one 5 mm polyp in descending colon  . Esophagogastroduodenoscopy (egd) with propofol N/A 07/05/2015    [id number]  . Esophageal dilation N/A 07/05/2015    Procedure: ESOPHAGEAL DILATION;  Surgeon: Daneil Dolin, MD;  Location: [facility];  Service: Endoscopy;  Laterality: N/A;  . Biopsy  07/05/2015    Procedure: BIOPSY;  Surgeon: Daneil Dolin, MD;  Location: [facility];  Service: Endoscopy;;  gastric polyp biopsy, ascending colon biopsy  . Lumbar wound debridement N/A 09/27/2015    Procedure: Exploration of Lumbar Wound w/ Repair CSF Leak/Lumbar Drain Placement;  Surgeon: Leeroy Cha, MD;  Location: [place];  Service: Neurosurgery;  Laterality: N/A;   Family History  Problem Relation Age of Onset  . Coronary artery disease Other     family Hx-sons  . Cancer Other   . Stroke Other     family Hx  . Hypertension Other     family Hx  . Diabetes Brother   . Heart disease Son     before age 3  . Diabetes Son   . Stroke Daughter 61  . Stroke Mother    Social History  Substance Use Topics  . Smoking status: Former Smoker -- 1.00 packs/day for 64 years    Types: Cigarettes    Start date: 12/24/1947    Quit date: 11/17/2001  . Smokeless tobacco: Never Used     Comment: Quit smoking in 2003  . Alcohol Use: No   OB History    Gravida Para Term Preterm AB TAB SAB Ectopic Multiple Living   8 6   2     5      Review of Systems  Musculoskeletal: Positive for myalgias, back pain and arthralgias.  Skin: Negative for wound.  Neurological: Positive for numbness.  All other systems reviewed and are negative.   Allergies  Cephalosporins; Levaquin; Macrodantin; Phenothiazines; Polysorbate; Prednisone; Buspirone; Cardura; Codeine; Acyclovir and related; Prochlorperazine; Ranexa; Atorvastatin; Ofloxacin; Other; Penicillins; and Pimozide  Home Medications   Prior to Admission medications   Medication Sig Start Date End Date Taking?  Authorizing Provider  ALPRAZolam (XANAX) 0.25 MG tablet Take 0.25 mg by mouth 2 (two) times daily as needed for anxiety.    Yes Historical Provider, MD  aspirin 81 MG EC tablet Take 81 mg by mouth every morning.    Yes Historical Provider, MD  Carbamide Peroxide (EAR WAX DROPS OT) 4 drops into both ears once a day on Tue, Sat. 08/03/15  Yes Historical Provider, MD  carvedilol (COREG) 25 MG tablet Take 25 mg by mouth 2 (two) times daily with a meal.   Yes Historical Provider, MD  DULoxetine (CYMBALTA) 60 MG capsule Take 60  mg by mouth at bedtime.    Yes Historical Provider, MD  furosemide (LASIX) 20 MG tablet Take 1 tablet (20 mg total) by mouth daily. 05/25/15  Yes Lendon Colonel, NP  gabapentin (NEURONTIN) 300 MG capsule Take 1 capsule (300 mg total) by mouth 3 (three) times daily. 10/08/15  Yes Kevan Ny Ditty, MD  Nutritional Supplements (BOOST NUTRITIONAL ENERGY PO) Should come on breakfast tray, Please document intake once a day.   Yes Historical Provider, MD  pantoprazole (PROTONIX) 40 MG tablet Take 40 mg by mouth every morning.    Yes Historical Provider, MD  potassium chloride (K-DUR,KLOR-CON) 10 MEQ tablet TAKE TWO (2) TABLETS BY MOUTH DAILY. 04/30/15  Yes Herminio Commons, MD  Probiotic Product (RESTORA) CAPS Take 1 capsule by mouth daily. 08/04/15  Yes Mahala Menghini, PA-C  UNABLE TO FIND Med Name: Magic Cup once daily on dinner tray   Yes Historical Provider, MD  acetaminophen (TYLENOL) 500 MG tablet Take 500 mg by mouth daily as needed for headache.     Historical Provider, MD  diazepam (VALIUM) 5 MG tablet Take 1 tablet (5 mg total) by mouth 2 (two) times daily. 10/27/15   Isla Pence, MD  diclofenac sodium (VOLTAREN) 1 % GEL Apply 2 g topically 4 (four) times daily. 10/27/15   Isla Pence, MD  fluticasone [facility]) 50 MCG/ACT nasal spray Place 2 sprays into both nostrils daily as needed for allergies.     Historical Provider, MD  loperamide (IMODIUM A-D) 2 MG tablet Take 2  mg by mouth 4 (four) times daily as needed for diarrhea or loose stools.    Historical Provider, MD  nitroGLYCERIN (NITROSTAT) 0.4 MG SL tablet Place 0.4 mg under the tongue every 5 (five) minutes as needed for chest pain. Reported on 08/04/2015    Historical Provider, MD  oxyCODONE-acetaminophen (PERCOCET/ROXICET) 5-325 MG tablet Take 1-2 tablets by mouth every 4 (four) hours as needed for moderate pain. Max APAP 3gm/24hours from all sources 10/13/15   Estill Dooms, MD   BP 113/45 mmHg  Pulse 65  Temp(Src) 97.9 F (36.6 C) (Oral)  Resp 16  Ht 5\' 1"  (1.549 m)  Wt 174 lb (78.926 kg)  BMI 32.89 kg/m2  SpO2 96%    Physical Exam  Constitutional: She is oriented to person, place, and time. She appears well-developed and well-nourished.  HENT:  Head: Normocephalic and atraumatic.  Eyes: EOM are normal.  Neck: Normal range of motion.  Cardiovascular: Normal rate.   Pulmonary/Chest: Effort normal. No respiratory distress.  Abdominal: Soft.  Musculoskeletal: She exhibits tenderness.  Positive straight leg raise on the left. Small knot noted to left buttock which is TTP.   Neurological: She is alert and oriented to person, place, and time.  Skin: Skin is warm and dry.  Psychiatric: She has a normal mood and affect.  Nursing note and vitals reviewed.   ED Course  Procedures  DIAGNOSTIC STUDIES: Oxygen Saturation is 98% on RA, normal by my interpretation.    COORDINATION OF CARE: 12:04 PM Discussed next steps with pt. Pt verbalized understanding and is agreeable with the plan.   Labs Review Labs Reviewed  URINALYSIS, ROUTINE W REFLEX MICROSCOPIC (NOT AT [facility]) - Abnormal; Notable for the following:    APPearance HAZY (*)    Hgb urine dipstick TRACE (*)    Leukocytes, UA MODERATE (*)    All other components within normal limits  URINE MICROSCOPIC-ADD ON - Abnormal; Notable for the following:  Squamous Epithelial / LPF 0-5 (*)    Bacteria, UA MANY (*)    All other components  within normal limits    Imaging Review Dg Lumbar Spine Complete  10/27/2015  CLINICAL DATA:  Left posterior hip pain goes down left leg. EXAM: LUMBAR SPINE - COMPLETE 4+ VIEW COMPARISON:  Intraoperative films from 09/24/2015 FINDINGS: patient is status post PLIF at L3-4 and L4-5. Bilateral pedicle screws seen at L3, L4, L5 with fusion bars bilaterally. No evidence for hardware complications. Posterior lateral bone graft material evident. Bony alignment stable compared to preoperative film 08/12/2015.There is no evidence of lumbar spine fracture. Alignment is normal. Intervertebral disc spaces are maintained. IMPRESSION: Status post PLIF from L3-L5 without evidence for hardware complications. Electronically Signed   By: Misty Stanley M.D.   On: 10/27/2015 12:52   Dg Hip Unilat With Pelvis 2-3 Views Left  10/27/2015  CLINICAL DATA:  Left hip pain, initial encounter EXAM: DG HIP (WITH OR WITHOUT PELVIS) 2-3V LEFT COMPARISON:  None. FINDINGS: There is no evidence of hip fracture or dislocation. There is no evidence of arthropathy or other focal bone abnormality. IMPRESSION: No acute abnormality noted. Electronically Signed   By: Inez Catalina M.D.   On: 10/27/2015 12:32   I have personally reviewed and evaluated these images and lab results as part of my medical decision-making.   EKG Interpretation None      MDM  Pt has no sign of infection today.  She has finished her vancomycin and her PICC line is gone.  The pt is feeling better.  She has a tender area to her left buttock, so I ordered voltaren crm.  She is also given a rx for valium as she describes painful muscle spasms.  Pt is to continue physical therapy and is to f/u with pcp and her neurosurgeon.  Pt knows to return if worse.  Final diagnoses:  Acute back pain with sciatica, left    I personally performed the services described in this documentation, which was scribed in my presence. The recorded information has been reviewed and is  accurate.    Isla Pence, MD 10/28/15 [phone]

## 2015-10-27 NOTE — Progress Notes (Signed)
Patient ID: [name], female   DOB: 02/12/1933, 82 y.o.   MRN: [id number]  Location:   [facility]   Place of Service:  SNF (31) Provider:  Justin Mend, PA-C  Patient Care Team: Tawni Carnes, PA-C as PCP - General (Physician Assistant) Daneil Dolin, MD (Gastroenterology)  Extended Emergency Contact Information Primary Emergency Contact: [name] [place] of [place] Phone: [phone] Relation: Daughter Secondary Emergency Contact: [name], [place] [address] [name] of [place] Phone: [phone] Relation: Son  Code Status:  DNR Goals of care: Advanced Directive information Advanced Directives 10/27/2015  Does patient have an advance directive? No  Type of Advance Directive -  Does patient want to make changes to advanced directive? -  Copy of advanced directive(s) in chart? -  Would patient like information on creating an advanced directive? No - patient declined information     Chief Complaint  Patient presents with  . Acute Visit   Secondary to increased numbness left leg HPI:  Pt is a 82 y.o. female seen today for an acute visit   She is complaining of what appears to be fairly acute change of numbness in her left leg She does have a history of spondylolisthesis of the lumbar region also apparent history of meningitis   culture  did not show any growth infectious disease was concerned however about infectious process she was on vancomycin.  She continues on  Percocet 5-3 25 mg 1 tab every 4 hours when necessary--he is also on Neurontin 300 mg 3 times a day.  She reports a numbness apparently started increasing yesterday and and at times will subside and then reoccur.  She says this is a change from baseline pain and discomfort that she feels in the leg.    C      Past Medical History  Diagnosis Date  . PVD (peripheral vascular disease) ([place])   . Other and unspecified hyperlipidemia   .  Panic disorder without agoraphobia   . HTN (hypertension)     Hx of it  . MI (myocardial infarction) ([place]) 2006  . Internal hemorrhoids without mention of complication   . Dysphagia, unspecified(787.20)   . Microscopic colitis 2003  . GERD (gastroesophageal reflux disease)     Hx Schatzki's ring, multiple EGD/ED last 01/06/2004  . Hyperlipemia   . Thyroid disease     recent abnl TSH per pt  . S/P colonoscopy 09/27/2001    internal hemorrhoids, desc colon inflam polyp, SB BX-chronic duodenitis, colitis  . Arthritis   . Shortness of breath   . Heart disease   . Bursitis     left shoulder  . Hyperlipidemia   . Sleep disorder     obstructive  . COPD (chronic obstructive pulmonary disease) ([place])   . Complication of anesthesia   . PONV (postoperative nausea and vomiting)     'a little nausea"  . Anxiety   . Depression   . Pneumonia 12/2011  . Heart murmur     'a littel'  . Cataract   . Paresthesia     hands, feet  . CHF (congestive heart failure) ([place])   . Headache   . Coronary atherosclerosis of native coronary artery     a. DES to CX, moderately severe stenosis RCA, mild stenosis LAD 04/2013   Past Surgical History  Procedure Laterality Date  . Tonsillectomy    . Unspecified area, hysterectomy  1972  partial  . Cholecystectomy  1998  . Right knee replacement  2007  . Right leg benign tumor    . Breast lumpectomy  1998    left, benign  . Left hand surgery    . Left rotator cuff surgery    . Nasal sinus surgery    . Bladder tack  06/2010  . Maloney dilation  03/16/2011    Gastritis. No H.pylori on bx. 78F maloney dilation with disruption of  occult cevical esophageal web  . Colonoscopy  03/16/2011    multiple hyperplastic colon polyps, sigmoid diverticulosis, melanosis coli  . Bladder suspension  11/09/2011    Procedure: TRANSVAGINAL TAPE (TVT) PROCEDURE;  Surgeon: Marissa Nestle, MD;  Location: [facility];  Service: Urology;  Laterality: N/A;  . Abdominal hysterectomy     . Appendectomy    . Carpal tunnel release  1989    left  . Anterior and posterior repair      with resection of vagina  . Shoulder surgery Left   . Joint replacement Right 2007  . Lumbar laminectomy/decompression microdiscectomy N/A 10/11/2012    Procedure: LUMBAR LAMINECTOMY/DECOMPRESSION MICRODISCECTOMY 2 LEVELS;  Surgeon: Floyce Stakes, MD;  Location: [facility];  Service: Neurosurgery;  Laterality: N/A;  L3-4 L4-5 Laminectomy  . Left heart catheterization with coronary angiogram N/A 05/14/2013    Procedure: LEFT HEART CATHETERIZATION WITH CORONARY ANGIOGRAM;  Surgeon: Blane Ohara, MD;  Location: [facility] CATH LAB;  Service: Cardiovascular;  Laterality: N/A;  . Cardiac catheterization    . Cardiac catheterization    . Coronary angioplasty with stent placement    . Colonoscopy with propofol N/A 07/05/2015    RMR:one 5 mm polyp in descending colon  . Esophagogastroduodenoscopy (egd) with propofol N/A 07/05/2015    [id number]  . Esophageal dilation N/A 07/05/2015    Procedure: ESOPHAGEAL DILATION;  Surgeon: Daneil Dolin, MD;  Location: [facility];  Service: Endoscopy;  Laterality: N/A;  . Biopsy  07/05/2015    Procedure: BIOPSY;  Surgeon: Daneil Dolin, MD;  Location: [facility];  Service: Endoscopy;;  gastric polyp biopsy, ascending colon biopsy  . Lumbar wound debridement N/A 09/27/2015    Procedure: Exploration of Lumbar Wound w/ Repair CSF Leak/Lumbar Drain Placement;  Surgeon: Leeroy Cha, MD;  Location: [place];  Service: Neurosurgery;  Laterality: N/A;    Allergies  Allergen Reactions  . Cephalosporins Diarrhea and Nausea Only    Lightheaded  . Levaquin [Levofloxacin In D5w] Swelling  . Macrodantin [Nitrofurantoin Macrocrystal] Swelling  . Phenothiazines Anaphylaxis and Hives  . Polysorbate Anaphylaxis  . Prednisone Shortness Of Breath  . Buspirone Itching  . Cardura [Doxazosin Mesylate] Itching  . Codeine Itching  . Acyclovir And Related   .  Prochlorperazine Other (See Comments)    unknown  . Ranexa [Ranolazine]     Severe drop in BP  . Atorvastatin Hives    Cramping; tolerates Crestor ok  . Ofloxacin Rash  . Other Itching and Rash    "WOOL"= make skin look like it has been burned  . Penicillins Other (See Comments)    Causes redness all over. Has patient had a PCN reaction causing immediate rash, facial/tongue/throat swelling, SOB or lightheadedness with hypotension: No Has patient had a PCN reaction causing severe rash involving mucus membranes or skin necrosis: No Has patient had a PCN reaction that required hospitalization No Has patient had a PCN reaction occurring within the last 10 years: No If all of the above answers  are "NO", then may proceed with Cephalosporin use.   . Pimozide Hives and Itching    Current Outpatient Prescriptions on File Prior to Visit  Medication Sig Dispense Refill  . acetaminophen (TYLENOL) 500 MG tablet Take 500 mg by mouth daily as needed for headache.     . ALPRAZolam (XANAX) 0.25 MG tablet Take 0.25 mg by mouth 2 (two) times daily as needed for anxiety.     [name] Kitchen aspirin 81 MG EC tablet Take 81 mg by mouth every morning.     . Carbamide Peroxide (EAR WAX DROPS OT) 4 drops into both ears once a day on Tue, Sat.  0  . carvedilol (COREG) 25 MG tablet Take 25 mg by mouth 2 (two) times daily with a meal.    . diazepam (VALIUM) 5 MG tablet Take 1 tablet (5 mg total) by mouth 2 (two) times daily. 10 tablet 0  . diclofenac sodium (VOLTAREN) 1 % GEL Apply 2 g topically 4 (four) times daily. 100 g 0  . DULoxetine (CYMBALTA) 60 MG capsule Take 60 mg by mouth at bedtime.     . fluticasone (FLONASE) 50 MCG/ACT nasal spray Place 2 sprays into both nostrils daily as needed for allergies.     . furosemide (LASIX) 20 MG tablet Take 1 tablet (20 mg total) by mouth daily. 90 tablet 3  . gabapentin (NEURONTIN) 300 MG capsule Take 1 capsule (300 mg total) by mouth 3 (three) times daily. 90 capsule 2  .  loperamide (IMODIUM A-D) 2 MG tablet Take 2 mg by mouth 4 (four) times daily as needed for diarrhea or loose stools.    . nitroGLYCERIN (NITROSTAT) 0.4 MG SL tablet Place 0.4 mg under the tongue every 5 (five) minutes as needed for chest pain. Reported on 08/04/2015    . Nutritional Supplements (BOOST NUTRITIONAL ENERGY PO) Should come on breakfast tray, Please document intake once a day.    . oxyCODONE-acetaminophen (PERCOCET/ROXICET) 5-325 MG tablet Take 1-2 tablets by mouth every 4 (four) hours as needed for moderate pain. Max APAP 3gm/24hours from all sources 360 tablet 0  . pantoprazole (PROTONIX) 40 MG tablet Take 40 mg by mouth every morning.     . potassium chloride (K-DUR,KLOR-CON) 10 MEQ tablet TAKE TWO (2) TABLETS BY MOUTH DAILY. 60 tablet 6  . Probiotic Product (RESTORA) CAPS Take 1 capsule by mouth daily. 30 capsule 0  . UNABLE TO FIND Med Name: Magic Cup once daily on dinner tray     No current facility-administered medications on file prior to visit.     Review of Systems   \In general she is not complaining of fever or chills.  Skin is not complaining of diaphoresis.  Head ears eyes nose mouth and throat does not complaining of sore throat or visual changes.  Respiratory does not complain of acute shortness of breath or cough.  Cardiac  Not complaining of chest pain t.  GI is not really complaining of abdominal pain nausea or vomiting.  Muscle skeletal is having t pain lower back left hip leg area. And now has numbness  Neurologic is not complaining of dizziness headache or syncopal-type feelings. Numbness in the left leg however  Psych is not complaining of depression or anxiety  Immunization History  Administered Date(s) Administered  . Influenza,inj,Quad PF,36+ Mos 02/17/2015  . PPD Test 10/08/2015  . Pneumococcal Polysaccharide-23 10/16/2015   Pertinent  Health Maintenance Due  Topic Date Due  . DEXA SCAN  10/14/2016 (Originally 10/23/1999)  .  INFLUENZA  VACCINE  11/16/2015  . PNA vac Low Risk Adult (2 of 2 - PCV13) 10/15/2016   No flowsheet data found. Functional Status Survey:    Filed Vitals:   10/27/15 1257  BP: 140/73  Pulse: 72  Temp: 97.5 F (36.4 C)  Resp: 20   There is no weight on file to calculate BMI. Physical Exam In general this is a pleasant elderly female in no acute distress.  Her skin is warm and dry.  Oropharynx is clear mucous membranes moist.  Chest is clear to auscultation I do not appreciate labored breathing.  Heart is regular rate and rhythm without murmur gallop or rub she has trace lower extremity edema.  Abdomen is soft does not appear to be acutely tender to palpation -- is obese bowel sounds are positive.  Musculoskeletal -is able to move all extremities 4 And continues to complain of some pain in her buttocks area and left leg-in now is complaining of numbness-strength appears to be relatively intact  I do not note any deformities.  Neurologic is grossly intact her speech is clear she does have touch sensation to the left leg  Psych she is alert and oriented pleasant and appropriate Labs reviewed:  Recent Labs  09/17/15 1344  10/01/15 0855  10/06/15 0500 10/15/15 1247 10/16/15 0619  NA 136  < > 137  < > 138 134* 137  K 4.4  < > 2.7*  < > 3.8 4.4 4.3  CL 105  < > 93*  < > 97* 100* 100*  CO2 24  < > 34*  < > 31 24 28   GLUCOSE 110*  < > 105*  < > 105* 197* 138*  BUN 18  < > 6  < > 13 24* 24*  CREATININE 1.04*  < > 0.84  < > 1.04* 1.24* 1.14*  CALCIUM 9.0  < > 8.1*  < > 9.3 8.8* 9.2  MG 2.0  --  1.6*  --  1.9  --   --   < > = values in this interval not displayed.  Recent Labs  02/21/15 1656  AST 19  ALT 14  ALKPHOS 57  BILITOT 0.5  PROT 6.8  ALBUMIN 3.8    Recent Labs  10/03/15 0420 10/04/15 0548 10/13/15 1025 10/15/15 1303  WBC 7.4 7.6 7.3 6.0  NEUTROABS 4.3 4.5 3.7  --   HGB 8.7* 8.8* 9.6* 10.0*  HCT 27.1* 27.7* 28.2* 29.8*  MCV 92.2 92.0 91.0 90.0  PLT 321  351 472* 432*   Lab Results  Component Value Date   TSH 2.770 11/25/2013   Lab Results  Component Value Date   HGBA1C 5.9* 11/24/2013   Lab Results  Component Value Date   CHOL 181 11/24/2013   HDL 56 11/24/2013   LDLCALC 103* 11/24/2013   TRIG 111 11/24/2013   CHOLHDL 3.2 11/24/2013    Significant Diagnostic Results in last 30 days:  Dg Chest 2 View  10/15/2015  CLINICAL DATA:  Substernal chest pain and pressure since 1140 hours, shortness of breath, increased pain with inspiration, history smoking, pneumonia, hypertension, coronary disease post MI, CHF EXAM: CHEST  2 VIEW COMPARISON:  02/21/2015 FINDINGS: Upper normal heart size. Atherosclerotic calcification aorta. Mediastinal contours and pulmonary vascularity normal. Lungs clear. No pleural effusion or pneumothorax. Bones demineralized. Old healed fracture lateral LEFT sixth rib. IMPRESSION: No acute abnormalities. Aortic atherosclerosis. Electronically Signed   By: Lavonia Dana M.D.   On: 10/15/2015 13:10   Dg Lumbar  Spine Complete  10/27/2015  CLINICAL DATA:  Left posterior hip pain goes down left leg. EXAM: LUMBAR SPINE - COMPLETE 4+ VIEW COMPARISON:  Intraoperative films from 09/24/2015 FINDINGS: patient is status post PLIF at L3-4 and L4-5. Bilateral pedicle screws seen at L3, L4, L5 with fusion bars bilaterally. No evidence for hardware complications. Posterior lateral bone graft material evident. Bony alignment stable compared to preoperative film 08/12/2015.There is no evidence of lumbar spine fracture. Alignment is normal. Intervertebral disc spaces are maintained. IMPRESSION: Status post PLIF from L3-L5 without evidence for hardware complications. Electronically Signed   By: Misty Stanley M.D.   On: 10/27/2015 12:52   Ct Angio Chest Pe W/cm &/or Wo Cm  10/15/2015  CLINICAL DATA:  82 year old female with substernal chest pain and shortness of breath EXAM: CT ANGIOGRAPHY CHEST WITH CONTRAST TECHNIQUE: Multidetector CT imaging  of the chest was performed using the standard protocol during bolus administration of intravenous contrast. Multiplanar CT image reconstructions and MIPs were obtained to evaluate the vascular anatomy. CONTRAST:  80 mL Isovue 370 COMPARISON:  Chest x-ray 10/15/2015; prior CT scan of the chest 07/29/2014 FINDINGS: Mediastinum: Unremarkable CT appearance of the thyroid gland. No suspicious mediastinal or hilar adenopathy. No soft tissue mediastinal mass. The thoracic esophagus is unremarkable. Heart/Vascular: Adequate opacification of the pulmonary arteries to the segmental level. Evaluation of the subsegmental branches is limited by respiratory motion artifact. No focal filling defect to suggest acute pulmonary embolus. Normal caliber thoracic aorta. Two vessel anatomy. The right brachiocephalic and left common carotid artery share a common origin. No evidence of dissection. Calcifications present along the course of the coronary arteries. The heart is normal in size. There is no pericardial effusion. Lungs/Pleura: No evidence of pleural effusion. Evaluation for small pulmonary nodules is limited by excessive respiratory motion artifact. There may be very mild ground-glass attenuation opacity in the lung apices bilaterally. Stable nodularity in the periphery of the right middle lobe just inferior to the minor fissure. Bones/Soft Tissues: No acute fracture or aggressive appearing lytic or blastic osseous lesion. Upper Abdomen: Visualized upper abdominal organs are unremarkable. Review of the MIP images confirms the above findings. IMPRESSION: 1. Negative for acute pulmonary embolus, pneumonia or other acute cardiopulmonary process. 2. Perhaps trace ground-glass attenuation opacity in the lung apices which may reflect subsegmental atelectasis or very early edema. 3. Coronary artery calcifications. Electronically Signed   By: Jacqulynn Cadet M.D.   On: 10/15/2015 15:45   Nm Myocar Multi W/spect W/wall Motion /  Ef  10/07/2015  CLINICAL DATA:  Chest pain. History of cardiac murmur, hypertension and shortness of breath. Recent lumbar fusion. EXAM: MYOCARDIAL IMAGING WITH SPECT (REST AND PHARMACOLOGIC-STRESS) GATED LEFT VENTRICULAR WALL MOTION STUDY LEFT VENTRICULAR EJECTION FRACTION TECHNIQUE: Standard myocardial SPECT imaging was performed after resting intravenous injection of 10 mCi Tc-61m tetrofosmin. Subsequently, intravenous infusion of Lexiscan was performed under the supervision of the Cardiology staff. At peak effect of the drug, 30 mCi Tc-92m tetrofosmin was injected intravenously and standard myocardial SPECT imaging was performed. Quantitative gated imaging was also performed to evaluate left ventricular wall motion, and estimate left ventricular ejection fraction. COMPARISON:  Chest CT 07/29/2014. Nuclear medicine myocardial scan 11/25/2013. FINDINGS: Perfusion: There is mildly decreased activity at the apex and within the septum which appears fixed. There is no associated wall motion abnormality, and this attributed to thinning. No reversible perfusion defects. Wall Motion: Normal left ventricular wall motion. No left ventricular dilation. Left Ventricular Ejection Fraction: 66 % End diastolic volume 46  ml End systolic volume 16 ml IMPRESSION: 1. No reversible ischemia or infarction. Apical and septal wall thinning. 2. Normal left ventricular wall motion. 3. Left ventricular ejection fraction 66% 4. Non invasive risk stratification*: Low *2012 Appropriate Use Criteria for Coronary Revascularization Focused Update: J Am Coll Cardiol. [id number]. http://content.airportbarriers.com.aspx?articleid=[email or web address] Electronically Signed   By: Richardean Sale M.D.   On: 10/07/2015 13:32   Dg Hip Unilat With Pelvis 2-3 Views Left  10/27/2015  CLINICAL DATA:  Left hip pain, initial encounter EXAM: DG HIP (WITH OR WITHOUT PELVIS) 2-3V LEFT COMPARISON:  None. FINDINGS: There is no evidence of hip fracture or  dislocation. There is no evidence of arthropathy or other focal bone abnormality. IMPRESSION: No acute abnormality noted. Electronically Signed   By: Inez Catalina M.D.   On: 10/27/2015 12:32    Assessment/Plan  #1-increased numbness left leg with history of meningitis and lumbar spondylolisthesis-she is on Neurontin 300 mg 3 times a day but this is apparently at this point not giving her relief this has increased fairly dramatically according to patient since yesterday-at this point concerned about possibly acute process with her comorbidities-will send her to the ER for expedient evaluation she does not appear to be in any distress at this point but again with her history  here will be aggressive  [id number]-of note greater than 35 minutes spent assessing patient-reviewing her chart-discussing her status with nursing staff-and formulating a plan of care that resulted in sending her to the [place], PA-C [phone]

## 2015-10-27 NOTE — ED Notes (Signed)
Pt ambulated to wheel chair and then placed on the commode. Pt attempting to get a urine specimen at this time.

## 2015-11-02 ENCOUNTER — Non-Acute Institutional Stay (SKILLED_NURSING_FACILITY): Payer: Medicare Other | Admitting: Internal Medicine

## 2015-11-02 ENCOUNTER — Encounter: Payer: Self-pay | Admitting: Internal Medicine

## 2015-11-02 DIAGNOSIS — M4316 Spondylolisthesis, lumbar region: Secondary | ICD-10-CM | POA: Diagnosis not present

## 2015-11-02 DIAGNOSIS — I110 Hypertensive heart disease with heart failure: Secondary | ICD-10-CM | POA: Diagnosis not present

## 2015-11-02 DIAGNOSIS — I5033 Acute on chronic diastolic (congestive) heart failure: Secondary | ICD-10-CM | POA: Diagnosis not present

## 2015-11-02 DIAGNOSIS — N179 Acute kidney failure, unspecified: Secondary | ICD-10-CM

## 2015-11-02 DIAGNOSIS — I2510 Atherosclerotic heart disease of native coronary artery without angina pectoris: Secondary | ICD-10-CM | POA: Diagnosis not present

## 2015-11-02 DIAGNOSIS — G039 Meningitis, unspecified: Secondary | ICD-10-CM

## 2015-11-02 DIAGNOSIS — G970 Cerebrospinal fluid leak from spinal puncture: Secondary | ICD-10-CM | POA: Diagnosis not present

## 2015-11-02 NOTE — Progress Notes (Signed)
Location:   [facility] Room Number: 133/P Place of Service:  SNF (31) Provider:  Justin Mend, PA-C  Patient Care Team: Tawni Carnes, [place] as PCP - General (Physician Assistant) Daneil Dolin, MD (Gastroenterology)  Extended Emergency Contact Information Primary Emergency Contact: [name] [place] of [place] Phone: [phone] Relation: Daughter Secondary Emergency Contact: [name], [place] [address] [name] of [place] Phone: [phone] Relation: Son  Code Status:  DNR Goals of care: Advanced Directive information Advanced Directives 11/02/2015  Does patient have an advance directive? Yes  Type of Advance Directive Out of facility DNR (pink MOST or yellow form)  Does patient want to make changes to advanced directive? No - Patient declined  Copy of advanced directive(s) in chart? Yes  Would patient like information on creating an advanced directive? -     Chief Complaint  Patient presents with  . Discharge Note    HPI:  Pt is a 82 y.o. female seen today For discharge-she was here for rehabilitation with a history of spondylolisthesis the ASIS of lumbar region also history of meningitis.  Her culture did not show any growth but infectious disease was concerned however about infectious process and she has completed a course of vancomycin.  .  Bedtime she has complained of some increased left hip radicular pain-actually went to the ER last week-is thought to not be an acute change she was prescribed Voltaren gel in addition to the Percocet Neurontin and apparently this is helping she says the pain is significantly improved.  She will need PT and OT at home.  She also had a short hospitalization for chest pain-but no acute etiology was found she has been essentially symptom free since her return.  She does have a history of coronary artery disease status post stenting  Patient will be  going home with her daughter who is very supportive-she will need again continued PT and OT for strengthening   Past Medical History  Diagnosis Date  . PVD (peripheral vascular disease) ([place])   . Other and unspecified hyperlipidemia   . Panic disorder without agoraphobia   . HTN (hypertension)     Hx of it  . MI (myocardial infarction) ([place]) 2006  . Internal hemorrhoids without mention of complication   . Dysphagia, unspecified(787.20)   . Microscopic colitis 2003  . GERD (gastroesophageal reflux disease)     Hx Schatzki's ring, multiple EGD/ED last 01/06/2004  . Hyperlipemia   . Thyroid disease     recent abnl TSH per pt  . S/P colonoscopy 09/27/2001    internal hemorrhoids, desc colon inflam polyp, SB BX-chronic duodenitis, colitis  . Arthritis   . Shortness of breath   . Heart disease   . Bursitis     left shoulder  . Hyperlipidemia   . Sleep disorder     obstructive  . COPD (chronic obstructive pulmonary disease) ([place])   . Complication of anesthesia   . PONV (postoperative nausea and vomiting)     'a little nausea"  . Anxiety   . Depression   . Pneumonia 12/2011  . Heart murmur     'a littel'  . Cataract   . Paresthesia     hands, feet  . CHF (congestive heart failure) ([place])   . Headache   . Coronary atherosclerosis of native coronary artery     a. DES to CX, moderately severe stenosis RCA, mild stenosis  LAD 04/2013   Past Surgical History  Procedure Laterality Date  . Tonsillectomy    . Unspecified area, hysterectomy  1972    partial  . Cholecystectomy  1998  . Right knee replacement  2007  . Right leg benign tumor    . Breast lumpectomy  1998    left, benign  . Left hand surgery    . Left rotator cuff surgery    . Nasal sinus surgery    . Bladder tack  06/2010  . Maloney dilation  03/16/2011    Gastritis. No H.pylori on bx. 53F maloney dilation with disruption of  occult cevical esophageal web  . Colonoscopy  03/16/2011    multiple hyperplastic colon  polyps, sigmoid diverticulosis, melanosis coli  . Bladder suspension  11/09/2011    Procedure: TRANSVAGINAL TAPE (TVT) PROCEDURE;  Surgeon: Marissa Nestle, MD;  Location: [facility]S;  Service: Urology;  Laterality: N/A;  . Abdominal hysterectomy    . Appendectomy    . Carpal tunnel release  1989    left  . Anterior and posterior repair      with resection of vagina  . Shoulder surgery Left   . Joint replacement Right 2007  . Lumbar laminectomy/decompression microdiscectomy N/A 10/11/2012    Procedure: LUMBAR LAMINECTOMY/DECOMPRESSION MICRODISCECTOMY 2 LEVELS;  Surgeon: Floyce Stakes, MD;  Location: [facility] NEURO ORS;  Service: Neurosurgery;  Laterality: N/A;  L3-4 L4-5 Laminectomy  . Left heart catheterization with coronary angiogram N/A 05/14/2013    Procedure: LEFT HEART CATHETERIZATION WITH CORONARY ANGIOGRAM;  Surgeon: Blane Ohara, MD;  Location: [facility] CATH LAB;  Service: Cardiovascular;  Laterality: N/A;  . Cardiac catheterization    . Cardiac catheterization    . Coronary angioplasty with stent placement    . Colonoscopy with propofol N/A 07/05/2015    RMR:one 5 mm polyp in descending colon  . Esophagogastroduodenoscopy (egd) with propofol N/A 07/05/2015    [id number]  . Esophageal dilation N/A 07/05/2015    Procedure: ESOPHAGEAL DILATION;  Surgeon: Daneil Dolin, MD;  Location: [facility] SU[facility];  Service: Endoscopy;  Laterality: N/A;  . Biopsy  07/05/2015    Procedure: BIOPSY;  Surgeon: Daneil Dolin, MD;  Location: [facility];  Service: Endoscopy;;  gastric polyp biopsy, ascending colon biopsy  . Lumbar wound debridement N/A 09/27/2015    Procedure: Exploration of Lumbar Wound w/ Repair CSF Leak/Lumbar Drain Placement;  Surgeon: Leeroy Cha, MD;  Location: [facility] NEURO ORS;  Service: Neurosurgery;  Laterality: N/A;    Allergies  Allergen Reactions  . Cephalosporins Diarrhea and Nausea Only    Lightheaded  . Levaquin [Levofloxacin In D5w] Swelling  . Macrodantin [Nitrofurantoin  Macrocrystal] Swelling  . Phenothiazines Anaphylaxis and Hives  . Polysorbate Anaphylaxis  . Prednisone Shortness Of Breath  . Buspirone Itching  . Cardura [Doxazosin Mesylate] Itching  . Codeine Itching  . Acyclovir And Related   . Prochlorperazine Other (See Comments)    unknown  . Ranexa [Ranolazine]     Severe drop in BP  . Atorvastatin Hives    Cramping; tolerates Crestor ok  . Ofloxacin Rash  . Other Itching and Rash    "WOOL"= make skin look like it has been burned  . Penicillins Other (See Comments)    Causes redness all over. Has patient had a PCN reaction causing immediate rash, facial/tongue/throat swelling, SOB or lightheadedness with hypotension: No Has patient had a PCN reaction causing severe rash involving mucus membranes or skin necrosis: No Has patient had  a PCN reaction that required hospitalization No Has patient had a PCN reaction occurring within the last 10 years: No If all of the above answers are "NO", then may proceed with Cephalosporin use.   . Pimozide Hives and Itching      Medication List       This list is accurate as of: 11/02/15 11:18 AM.  Always use your most recent med list.               acetaminophen 500 MG tablet  Commonly known as:  TYLENOL  Take 500 mg by mouth daily as needed for headache.     ALPRAZolam 0.25 MG tablet  Commonly known as:  XANAX  Take 0.25 mg by mouth 2 (two) times daily as needed for anxiety.     aspirin 81 MG EC tablet  Take 81 mg by mouth every morning.     carvedilol 25 MG tablet  Commonly known as:  COREG  Take 25 mg by mouth 2 (two) times daily with a meal.     DULoxetine 60 MG capsule  Commonly known as:  CYMBALTA  Take 60 mg by mouth at bedtime.     EAR WAX DROPS OT  4 drops into both ears once a day on Tue, Sat.     fluticasone 50 MCG/ACT nasal spray  Commonly known as:  FLONASE  Place 2 sprays into both nostrils daily as needed for allergies.     furosemide 20 MG tablet  Commonly known  as:  LASIX  Take 1 tablet (20 mg total) by mouth daily.     gabapentin 300 MG capsule  Commonly known as:  NEURONTIN  Take 1 capsule (300 mg total) by mouth 3 (three) times daily.     IMODIUM A-D 2 MG tablet  Generic drug:  loperamide  Take 2 mg by mouth 4 (four) times daily as needed for diarrhea or loose stools.     MILK OF MAGNESIA PO  Milk of magnesia 30 ml by mouth daily as needed for constipation, may try fleets enema     nitroGLYCERIN 0.4 MG SL tablet  Commonly known as:  NITROSTAT  Place 0.4 mg under the tongue every 5 (five) minutes as needed for chest pain. Reported on 08/04/2015     NON FORMULARY  Magic Cup once daily on dinner tray     oxyCODONE-acetaminophen 5-325 MG tablet  Commonly known as:  PERCOCET/ROXICET  Take 1-2 tablets by mouth every 4 (four) hours as needed for moderate pain. Max APAP 3gm/24hours from all sources     pantoprazole 40 MG tablet  Commonly known as:  PROTONIX  Take 40 mg by mouth every morning.     potassium chloride 10 MEQ tablet  Commonly known as:  K-DUR,KLOR-CON  TAKE TWO (2) TABLETS BY MOUTH DAILY.     PREPARATION H 0.25-14-74.9 % rectal ointment  Generic drug:  phenylephrine-shark liver oil-mineral oil-petrolatum  Apply to rectal area at bedtime     RISA-BID PROBIOTIC PO  Tablet; 1 billion-250 cell-mg; oral once a day     VOLTAREN 1 % Gel  Generic drug:  diclofenac sodium  Apply 1 g topical to residents back as needed        Review of Systems   General no complaints of fever or chills says her back pain and leg pain is significantly improved.  Skin does not complain of rashes or itching.  Head ears eyes nose mouth and throat has prescription lenses is not complaining  of visual changes or sore throat.  Respiratory denies shortness breath or cough.  Cardiac no further chest pain minimal lower extremity edema.  GI does not complain of abdominal discomfort nausea vomiting diarrhea constipation.  GU is not complaining  of dysuria or Crohn's urinating.  Musculoskeletal again notes her back and left leg pain was significantly improved.  Neurologic is not at this point complaining of any syncope headache dizziness or numbness.  Psych is not complaining of anxiety or depression appears to be in good spirits looking very forward to going home.    Immunization History  Administered Date(s) Administered  . Influenza,inj,Quad PF,36+ Mos 02/17/2015  . PPD Test 10/08/2015  . Pneumococcal Polysaccharide-23 10/16/2015   Pertinent  Health Maintenance Due  Topic Date Due  . DEXA SCAN  10/14/2016 (Originally 10/23/1999)  . INFLUENZA VACCINE  11/16/2015  . PNA vac Low Risk Adult (2 of 2 - PCV13) 10/15/2016   No flowsheet data found. Functional Status Survey:    Filed Vitals:   11/02/15 1041  BP: 137/79  Pulse: 60  Temp: 98.6 F (37 C)  TempSrc: Oral  Resp: 16   There is no weight on file to calculate BMI. Physical Exam   In general this is a pleasant LE resident no distress sitting comfortably in her wheelchair.  Her skin is warm and dry she does have a well-healed scar over her right knee.  Eyes pupils appear reactive lightacuity appears grossly intact she has prescription lenses.  Oropharynx is clear mucous membranes moist.  Chest is clear to auscultation there is no labored breathing.  Heart is regular rate and rhythm without murmur gallop or rub she has quite minimal lower extremity edema.  Abdomen soft obese soft nontender positive bowel sounds.  Musculoskeletal does move all extremities 4 strength appears to be intact-she does have a back brace applied currently.  Neurologic is grossly intact strength is intact all 4 extremities touch sensation appears to be intact no lateralizing findings her speech is clear.  Psych she is alert and oriented pleasant and appropriate very much looking forward to going home with her daughter    Labs reviewed:  Recent Labs  09/17/15 1344   10/01/15 0855  10/06/15 0500 10/15/15 1247 10/16/15 0619  NA 136  < > 137  < > 138 134* 137  K 4.4  < > 2.7*  < > 3.8 4.4 4.3  CL 105  < > 93*  < > 97* 100* 100*  CO2 24  < > 34*  < > 31 24 28   GLUCOSE 110*  < > 105*  < > 105* 197* 138*  BUN 18  < > 6  < > 13 24* 24*  CREATININE 1.04*  < > 0.84  < > 1.04* 1.24* 1.14*  CALCIUM 9.0  < > 8.1*  < > 9.3 8.8* 9.2  MG 2.0  --  1.6*  --  1.9  --   --   < > = values in this interval not displayed.  Recent Labs  02/21/15 1656  AST 19  ALT 14  ALKPHOS 57  BILITOT 0.5  PROT 6.8  ALBUMIN 3.8    Recent Labs  10/03/15 0420 10/04/15 0548 10/13/15 1025 10/15/15 1303  WBC 7.4 7.6 7.3 6.0  NEUTROABS 4.3 4.5 3.7  --   HGB 8.7* 8.8* 9.6* 10.0*  HCT 27.1* 27.7* 28.2* 29.8*  MCV 92.2 92.0 91.0 90.0  PLT 321 351 472* 432*   Lab Results  Component Value Date  TSH 2.770 11/25/2013   Lab Results  Component Value Date   HGBA1C 5.9* 11/24/2013   Lab Results  Component Value Date   CHOL 181 11/24/2013   HDL 56 11/24/2013   LDLCALC 103* 11/24/2013   TRIG 111 11/24/2013   CHOLHDL 3.2 11/24/2013    Significant Diagnostic Results in last 30 days:  Dg Chest 2 View  10/15/2015  CLINICAL DATA:  Substernal chest pain and pressure since 1140 hours, shortness of breath, increased pain with inspiration, history smoking, pneumonia, hypertension, coronary disease post MI, CHF EXAM: CHEST  2 VIEW COMPARISON:  02/21/2015 FINDINGS: Upper normal heart size. Atherosclerotic calcification aorta. Mediastinal contours and pulmonary vascularity normal. Lungs clear. No pleural effusion or pneumothorax. Bones demineralized. Old healed fracture lateral LEFT sixth rib. IMPRESSION: No acute abnormalities. Aortic atherosclerosis. Electronically Signed   By: Lavonia Dana M.D.   On: 10/15/2015 13:10   Dg Lumbar Spine Complete  10/27/2015  CLINICAL DATA:  Left posterior hip pain goes down left leg. EXAM: LUMBAR SPINE - COMPLETE 4+ VIEW COMPARISON:  Intraoperative  films from 09/24/2015 FINDINGS: patient is status post PLIF at L3-4 and L4-5. Bilateral pedicle screws seen at L3, L4, L5 with fusion bars bilaterally. No evidence for hardware complications. Posterior lateral bone graft material evident. Bony alignment stable compared to preoperative film 08/12/2015.There is no evidence of lumbar spine fracture. Alignment is normal. Intervertebral disc spaces are maintained. IMPRESSION: Status post PLIF from L3-L5 without evidence for hardware complications. Electronically Signed   By: Misty Stanley M.D.   On: 10/27/2015 12:52   Ct Angio Chest Pe W/cm &/or Wo Cm  10/15/2015  CLINICAL DATA:  82 year old female with substernal chest pain and shortness of breath EXAM: CT ANGIOGRAPHY CHEST WITH CONTRAST TECHNIQUE: Multidetector CT imaging of the chest was performed using the standard protocol during bolus administration of intravenous contrast. Multiplanar CT image reconstructions and MIPs were obtained to evaluate the vascular anatomy. CONTRAST:  80 mL Isovue 370 COMPARISON:  Chest x-ray 10/15/2015; prior CT scan of the chest 07/29/2014 FINDINGS: Mediastinum: Unremarkable CT appearance of the thyroid gland. No suspicious mediastinal or hilar adenopathy. No soft tissue mediastinal mass. The thoracic esophagus is unremarkable. Heart/Vascular: Adequate opacification of the pulmonary arteries to the segmental level. Evaluation of the subsegmental branches is limited by respiratory motion artifact. No focal filling defect to suggest acute pulmonary embolus. Normal caliber thoracic aorta. Two vessel anatomy. The right brachiocephalic and left common carotid artery share a common origin. No evidence of dissection. Calcifications present along the course of the coronary arteries. The heart is normal in size. There is no pericardial effusion. Lungs/Pleura: No evidence of pleural effusion. Evaluation for small pulmonary nodules is limited by excessive respiratory motion artifact. There may  be very mild ground-glass attenuation opacity in the lung apices bilaterally. Stable nodularity in the periphery of the right middle lobe just inferior to the minor fissure. Bones/Soft Tissues: No acute fracture or aggressive appearing lytic or blastic osseous lesion. Upper Abdomen: Visualized upper abdominal organs are unremarkable. Review of the MIP images confirms the above findings. IMPRESSION: 1. Negative for acute pulmonary embolus, pneumonia or other acute cardiopulmonary process. 2. Perhaps trace ground-glass attenuation opacity in the lung apices which may reflect subsegmental atelectasis or very early edema. 3. Coronary artery calcifications. Electronically Signed   By: Jacqulynn Cadet M.D.   On: 10/15/2015 15:45   Nm Myocar Multi W/spect W/wall Motion / Ef  10/07/2015  CLINICAL DATA:  Chest pain. History of cardiac murmur, hypertension and  shortness of breath. Recent lumbar fusion. EXAM: MYOCARDIAL IMAGING WITH SPECT (REST AND PHARMACOLOGIC-STRESS) GATED LEFT VENTRICULAR WALL MOTION STUDY LEFT VENTRICULAR EJECTION FRACTION TECHNIQUE: Standard myocardial SPECT imaging was performed after resting intravenous injection of 10 mCi Tc-16m tetrofosmin. Subsequently, intravenous infusion of Lexiscan was performed under the supervision of the Cardiology staff. At peak effect of the drug, 30 mCi Tc-34m tetrofosmin was injected intravenously and standard myocardial SPECT imaging was performed. Quantitative gated imaging was also performed to evaluate left ventricular wall motion, and estimate left ventricular ejection fraction. COMPARISON:  Chest CT 07/29/2014. Nuclear medicine myocardial scan 11/25/2013. FINDINGS: Perfusion: There is mildly decreased activity at the apex and within the septum which appears fixed. There is no associated wall motion abnormality, and this attributed to thinning. No reversible perfusion defects. Wall Motion: Normal left ventricular wall motion. No left ventricular dilation. Left  Ventricular Ejection Fraction: 66 % End diastolic volume 46 ml End systolic volume 16 ml IMPRESSION: 1. No reversible ischemia or infarction. Apical and septal wall thinning. 2. Normal left ventricular wall motion. 3. Left ventricular ejection fraction 66% 4. Non invasive risk stratification*: Low *2012 Appropriate Use Criteria for Coronary Revascularization Focused Update: J Am Coll Cardiol. [id number]. http://content.[email or web address].aspx?articleid=[email or web address] Electronically Signed   By: Richardean Sale M.D.   On: 10/07/2015 13:32   Dg Hip Unilat With Pelvis 2-3 Views Left  10/27/2015  CLINICAL DATA:  Left hip pain, initial encounter EXAM: DG HIP (WITH OR WITHOUT PELVIS) 2-3V LEFT COMPARISON:  None. FINDINGS: There is no evidence of hip fracture or dislocation. There is no evidence of arthropathy or other focal bone abnormality. IMPRESSION: No acute abnormality noted. Electronically Signed   By: Inez Catalina M.D.   On: 10/27/2015 12:32    Assessment/Plan  History of spondylitis E ACEs of the lumbar region-again she appears to be doing better now with the Percocet-Neurontin-and also Voltaren gel which was recently added at this point will need follow-up with neurosurgeon-but appears to be making good progress here.  Regards to possible meningitis she has completed vancomycin again has been afebrile white count has been normal will order labs to be drawn by home health later this week with primary care provider notified of results.   history of diastolic CHF this appears stable she has minimal edema she has gained about 6 pounds since her admission here over the past months but I suspect this may be appetite related does not complaining any chest pain or shortness of breath.--She is on Lasix with potassium supplementation-will have home health update a BMP later this week- She does have a listed history of acute renal failure but creatinine appears to be stable during her stay here most  recently 1.14 on lab done on 10/16/2015 again will have home health update lab later this week to ensure stability  In regards to coronary artery disease again she does have a short hospitalization for monitoring with no acute etiology found-she continues on aspirin-Coreg-.  -history of depression-she is on Cymbalta I suspect this helps as well with her pain issues this is been stable she is also on Xanax as needed for anxiety which has been stable during her stay here.  Again she will be going home with her daughter will need continued PT and OT as well as home health support to evaluate and treat-she will need a rolling walker and bedside commode secondary to continue lower extremity weakness with history of spondylolisthesis-but has gained strength during her stay here which is encouraging and pain appears to  be under much better control.  [id number] note greater than 30 minutes spent preparing this discharge summary-greater than 50% of time spent coordinating a plan of care for numerous diagnoses      Oralia Manis, [place]

## 2015-11-03 DIAGNOSIS — I509 Heart failure, unspecified: Secondary | ICD-10-CM | POA: Diagnosis not present

## 2015-11-03 DIAGNOSIS — Z79899 Other long term (current) drug therapy: Secondary | ICD-10-CM | POA: Diagnosis not present

## 2015-11-03 DIAGNOSIS — G009 Bacterial meningitis, unspecified: Secondary | ICD-10-CM | POA: Diagnosis not present

## 2015-11-03 DIAGNOSIS — G039 Meningitis, unspecified: Secondary | ICD-10-CM | POA: Diagnosis not present

## 2015-11-03 DIAGNOSIS — I2510 Atherosclerotic heart disease of native coronary artery without angina pectoris: Secondary | ICD-10-CM | POA: Diagnosis not present

## 2015-11-03 DIAGNOSIS — I110 Hypertensive heart disease with heart failure: Secondary | ICD-10-CM | POA: Diagnosis not present

## 2015-11-03 DIAGNOSIS — R1310 Dysphagia, unspecified: Secondary | ICD-10-CM | POA: Diagnosis not present

## 2015-11-03 DIAGNOSIS — I472 Ventricular tachycardia: Secondary | ICD-10-CM | POA: Diagnosis not present

## 2015-11-03 DIAGNOSIS — E079 Disorder of thyroid, unspecified: Secondary | ICD-10-CM | POA: Diagnosis not present

## 2015-11-03 DIAGNOSIS — I480 Paroxysmal atrial fibrillation: Secondary | ICD-10-CM | POA: Diagnosis not present

## 2015-11-03 DIAGNOSIS — G970 Cerebrospinal fluid leak from spinal puncture: Secondary | ICD-10-CM | POA: Diagnosis not present

## 2015-11-03 DIAGNOSIS — I252 Old myocardial infarction: Secondary | ICD-10-CM | POA: Diagnosis not present

## 2015-11-03 DIAGNOSIS — E785 Hyperlipidemia, unspecified: Secondary | ICD-10-CM | POA: Diagnosis not present

## 2015-11-03 DIAGNOSIS — F341 Dysthymic disorder: Secondary | ICD-10-CM | POA: Diagnosis not present

## 2015-11-03 DIAGNOSIS — Z5181 Encounter for therapeutic drug level monitoring: Secondary | ICD-10-CM | POA: Diagnosis not present

## 2015-11-05 ENCOUNTER — Encounter: Payer: Self-pay | Admitting: Adult Health

## 2015-11-05 ENCOUNTER — Ambulatory Visit (INDEPENDENT_AMBULATORY_CARE_PROVIDER_SITE_OTHER): Payer: Medicare Other | Admitting: Adult Health

## 2015-11-05 ENCOUNTER — Encounter (INDEPENDENT_AMBULATORY_CARE_PROVIDER_SITE_OTHER): Payer: Self-pay

## 2015-11-05 VITALS — BP 142/58 | HR 83 | Ht 61.0 in | Wt 174.8 lb

## 2015-11-05 DIAGNOSIS — I10 Essential (primary) hypertension: Secondary | ICD-10-CM | POA: Diagnosis not present

## 2015-11-05 DIAGNOSIS — M791 Myalgia: Secondary | ICD-10-CM | POA: Diagnosis not present

## 2015-11-05 DIAGNOSIS — M7918 Myalgia, other site: Secondary | ICD-10-CM

## 2015-11-05 DIAGNOSIS — I2510 Atherosclerotic heart disease of native coronary artery without angina pectoris: Secondary | ICD-10-CM

## 2015-11-05 NOTE — Patient Instructions (Addendum)
Your physician wants you to follow-up in: 6 months You will receive a reminder letter in the mail two months in advance. If you don't receive a letter, please call our office to schedule the follow-up appointment.     See Dr Joya Salm for back pain,There office is expecting your call, ask for Jacqlyn Larsen [phone] ext [phone] to make an apt      Your physician recommends that you continue on your current medications as directed. Please refer to the Current Medication list given to you today.    Thank you for choosing [facility] !

## 2015-11-05 NOTE — Progress Notes (Signed)
Name: [name]    DOB: 04/11/1933  Age: 83 y.o.  MR#: [id number]       PCP:  [name]      Insurance: Payor: MEDICARE / Plan: MEDICARE PART A AND B / Product Type: *No Product type* /   CC:   No chief complaint on file.   VS Filed Vitals:   11/05/15 1303  Height: 5\' 1"  (1.549 m)  Weight: 174 lb 12.8 oz (79.289 kg)    Weights Current Weight  11/05/15 174 lb 12.8 oz (79.289 kg)  10/27/15 174 lb (78.926 kg)  10/20/15 174 lb (78.926 kg)    Blood Pressure  BP Readings from Last 3 Encounters:  11/02/15 137/79  10/27/15 113/45  10/27/15 140/73     Admit date:  (Not on file) Last encounter with RMR:  09/17/2015   Allergy Cephalosporins; Levaquin; Macrodantin; Phenothiazines; Polysorbate; Prednisone; Buspirone; Cardura; Codeine; Acyclovir and related; Prochlorperazine; Ranexa; Atorvastatin; Ofloxacin; Other; Penicillins; and Pimozide  Current Outpatient Prescriptions  Medication Sig Dispense Refill  . acetaminophen (TYLENOL) 500 MG tablet Take 500 mg by mouth daily as needed for headache.     . ALPRAZolam (XANAX) 0.25 MG tablet Take 0.25 mg by mouth 2 (two) times daily as needed for anxiety.     [name] Kitchen aspirin 81 MG EC tablet Take 81 mg by mouth every morning.     . Carbamide Peroxide (EAR WAX DROPS OT) 4 drops into both ears once a day on Tue, Sat.  0  . carvedilol (COREG) 25 MG tablet Take 25 mg by mouth 2 (two) times daily with a meal.    . diclofenac sodium (VOLTAREN) 1 % GEL Apply 1 g topical to residents back as needed    . DULoxetine (CYMBALTA) 60 MG capsule Take 60 mg by mouth at bedtime.     . fluticasone (FLONASE) 50 MCG/ACT nasal spray Place 2 sprays into both nostrils daily as needed for allergies.     . furosemide (LASIX) 20 MG tablet Take 1 tablet (20 mg total) by mouth daily. 90 tablet 3  . gabapentin (NEURONTIN) 300 MG capsule Take 1 capsule (300 mg total) by mouth 3 (three) times daily. 90 capsule 2  . loperamide (IMODIUM A-D) 2 MG tablet Take 2 mg by mouth  4 (four) times daily as needed for diarrhea or loose stools.    . Magnesium Hydroxide (MILK OF MAGNESIA PO) Milk of magnesia 30 ml by mouth daily as needed for constipation, may try fleets enema    . nitroGLYCERIN (NITROSTAT) 0.4 MG SL tablet Place 0.4 mg under the tongue every 5 (five) minutes as needed for chest pain. Reported on 08/04/2015    . NON FORMULARY Magic Cup once daily on dinner tray    . oxyCODONE-acetaminophen (PERCOCET/ROXICET) 5-325 MG tablet Take 1-2 tablets by mouth every 4 (four) hours as needed for moderate pain. Max APAP 3gm/24hours from all sources 360 tablet 0  . pantoprazole (PROTONIX) 40 MG tablet Take 40 mg by mouth every morning.     . phenylephrine-shark liver oil-mineral oil-petrolatum (PREPARATION H) 0.25-14-74.9 % rectal ointment Apply to rectal area at bedtime    . potassium chloride (K-DUR,KLOR-CON) 10 MEQ tablet TAKE TWO (2) TABLETS BY MOUTH DAILY. 60 tablet 6  . Probiotic Product (RISA-BID PROBIOTIC PO) Tablet; 1 billion-250 cell-mg; oral once a day     No current facility-administered medications for this visit.    Discontinued Meds:   There are no discontinued medications.  Patient Active Problem List  Diagnosis Date Noted  . Chest pain at rest 10/15/2015  . Chronic low back pain 10/15/2015  . Hyponatremia 10/15/2015  . Hyperglycemia 10/15/2015  . Thrombocytosis ([place]) 10/15/2015  . Chest pain 10/15/2015  . Depression   . Anxiety   . Gastroesophageal reflux disease without esophagitis   . Mild cognitive impairment 10/14/2015  . Iron deficiency anemia due to chronic blood loss   . Meningitis 10/01/2015  . Coronary artery disease due to lipid rich plaque   . NSVT (nonsustained ventricular tachycardia) ([place])   . PAF (paroxysmal atrial fibrillation) ([place])   . CSF leak 09/27/2015  . Spondylolisthesis of lumbar region 09/24/2015  . Gastric polyp   . History of colonic polyps   . Chronic diarrhea   . Diarrhea 04/01/2015  . Esophageal dysphagia  04/01/2015  . PAOD (peripheral arterial occlusive disease) ([place]) 03/05/2015  . Pain in the chest   . Hyperlipidemia   . Weight gain 08/12/2014  . Normocytic anemia 08/07/2014  . Hemorrhoids 08/06/2014  . Diastolic CHF, acute on chronic (HCC) 07/30/2014  . CHF (congestive heart failure) ([place]) 07/29/2014  . Rectal bleeding 07/06/2014  . Constipation 07/06/2014  . Lower extremity edema 11/24/2013  . Acute kidney failure ([place]) 11/24/2013  . Hypokalemia 11/24/2013  . Other and unspecified angina pectoris 05/14/2013  . Hematochezia 02/13/2011  . Major depression ([place]) 09/28/2010  . FATIGUE 04/13/2009  . Chronic diastolic heart failure ([place]) 11/30/2008  . DYSPNEA 11/30/2008  . HYPERLIPIDEMIA-MIXED 11/27/2008  . CAD, NATIVE VESSEL - PCI + DES to left circumflex 05/14/13 11/27/2008  . Peripheral vascular disease ([place]) 11/27/2008  . PANIC ATTACK 02/28/2008  . MI 02/28/2008  . Internal hemorrhoids 02/28/2008  . GASTROESOPHAGEAL REFLUX DISEASE, CHRONIC 02/28/2008  . COLITIS 02/28/2008  . Dysphagia 02/28/2008    LABS    Component Value Date/Time   NA 137 10/16/2015 0619   NA 134* 10/15/2015 1247   NA 138 10/06/2015 0500   K 4.3 10/16/2015 0619   K 4.4 10/15/2015 1247   K 3.8 10/06/2015 0500   CL 100* 10/16/2015 0619   CL 100* 10/15/2015 1247   CL 97* 10/06/2015 0500   CO2 28 10/16/2015 0619   CO2 24 10/15/2015 1247   CO2 31 10/06/2015 0500   GLUCOSE 138* 10/16/2015 0619   GLUCOSE 197* 10/15/2015 1247   GLUCOSE 105* 10/06/2015 0500   BUN 24* 10/16/2015 0619   BUN 24* 10/15/2015 1247   BUN 13 10/06/2015 0500   CREATININE 1.14* 10/16/2015 0619   CREATININE 1.24* 10/15/2015 1247   CREATININE 1.04* 10/06/2015 0500   CREATININE 1.17* 02/13/2011 1526   CALCIUM 9.2 10/16/2015 0619   CALCIUM 8.8* 10/15/2015 1247   CALCIUM 9.3 10/06/2015 0500   GFRNONAA 44* 10/16/2015 0619   GFRNONAA 40* 10/15/2015 1247   GFRNONAA 49* 10/06/2015 0500   GFRAA 51* 10/16/2015 0619   GFRAA 46*  10/15/2015 1247   GFRAA 57* 10/06/2015 0500   CMP     Component Value Date/Time   NA 137 10/16/2015 0619   K 4.3 10/16/2015 0619   CL 100* 10/16/2015 0619   CO2 28 10/16/2015 0619   GLUCOSE 138* 10/16/2015 0619   BUN 24* 10/16/2015 0619   CREATININE 1.14* 10/16/2015 0619   CREATININE 1.17* 02/13/2011 1526   CALCIUM 9.2 10/16/2015 0619   PROT 6.8 02/21/2015 1656   ALBUMIN 3.8 02/21/2015 1656   AST 19 02/21/2015 1656   ALT 14 02/21/2015 1656   ALKPHOS 57 02/21/2015 1656   BILITOT 0.5 02/21/2015 1656  GFRNONAA 44* 10/16/2015 0619   GFRAA 51* 10/16/2015 0619       Component Value Date/Time   WBC 6.0 10/15/2015 1303   WBC 7.3 10/13/2015 1025   WBC 7.6 10/04/2015 0548   HGB 10.0* 10/15/2015 1303   HGB 9.6* 10/13/2015 1025   HGB 8.8* 10/04/2015 0548   HCT 29.8* 10/15/2015 1303   HCT 28.2* 10/13/2015 1025   HCT 27.7* 10/04/2015 0548   MCV 90.0 10/15/2015 1303   MCV 91.0 10/13/2015 1025   MCV 92.0 10/04/2015 0548    Lipid Panel     Component Value Date/Time   CHOL 181 11/24/2013 1110   TRIG 111 11/24/2013 1110   HDL 56 11/24/2013 1110   CHOLHDL 3.2 11/24/2013 1110   VLDL 22 11/24/2013 1110   LDLCALC 103* 11/24/2013 1110    ABG No results found for: PHART, PCO2ART, PO2ART, HCO3, TCO2, ACIDBASEDEF, O2SAT   Lab Results  Component Value Date   TSH 2.770 11/25/2013   BNP (last 3 results)  Recent Labs  10/15/15 1247  BNP 59.0    ProBNP (last 3 results) No results for input(s): PROBNP in the last 8760 hours.  Cardiac Panel (last 3 results) No results for input(s): CKTOTAL, CKMB, TROPONINI, RELINDX in the last 72 hours.  Iron/TIBC/Ferritin/ %Sat    Component Value Date/Time   IRON 30 10/13/2015 1025   TIBC 382 10/13/2015 1025   FERRITIN 52 10/13/2015 1025   IRONPCTSAT 8* 10/13/2015 1025     EKG Orders placed or performed during the hospital encounter of 10/15/15  . EKG 12-Lead  . EKG 12-Lead  . EKG 12-Lead  . EKG 12-Lead  . EKG 12-Lead  . EKG      Prior Assessment and Plan Problem List as of 11/05/2015      Cardiovascular and Mediastinum   MI   CAD, NATIVE VESSEL - PCI + DES to left circumflex 05/14/13   Last Assessment & Plan 05/29/2013 Office Visit Written 05/29/2013  2:04 PM by Lendon Colonel, NP    She offers no complaints of recurrent chest pain. She is medically compliant. Refills on NTG are provided.       Chronic diastolic heart failure [facility])   Last Assessment & Plan 05/29/2013 Office Visit Written 05/29/2013  2:04 PM by Lendon Colonel, NP    No evidence of fluid retention by exam. She is stable on her weight.      Peripheral vascular disease [facility])   Last Assessment & Plan 09/28/2010 Office Visit Written 09/28/2010  1:58 PM by Ezra Sites, MD    Patient reports no claudication.      Internal hemorrhoids   Last Assessment & Plan 07/06/2014 Office Visit Edited 07/06/2014  9:42 AM by Mahala Menghini, PA-C    Recent flare in hemorrhoids in setting of constipation and with rectal bleeding. Several illnesses lately and limited mobility like cause of constipation. Lat TCS 2012 as outlined under [place].   Start anusol HC suppositories bid for 12 days. miralax 1/2 to 1 capful daily to keep stool soft.  Patient not interested in [facility] hemorrhoid banding at this time. Return to the office in 3-4 weeks for follow up or call sooner if needed.       Other and unspecified angina pectoris   CHF (congestive heart failure) (HCC)   Diastolic CHF, acute on chronic [facility])   Hemorrhoids   Last Assessment & Plan 08/06/2014 Office Visit Edited 08/07/2014  1:46 PM by Mahala Menghini, PA-C  Trial of Proctocort foam anorectally bid for 10 days. Call with PR in couple of weeks. Patient has tolerated hydrocortisone hemorrhoid creams before (allergy to prednisone).      PAOD (peripheral arterial occlusive disease) ([place])   Coronary artery disease due to lipid rich plaque   NSVT (nonsustained ventricular tachycardia) (HCC)   PAF (paroxysmal  atrial fibrillation) [facility])     Digestive   GASTROESOPHAGEAL REFLUX DISEASE, CHRONIC   Last Assessment & Plan 04/20/2011 Office Visit Written 04/20/2011  4:13 PM by Mahala Menghini, PA    Well controlled.      COLITIS   Dysphagia   Last Assessment & Plan 02/13/2011 Office Visit Written 02/14/2011  7:59 PM by Andria Meuse, NP    Hx esophageal dysphagia w/ hx Schatzki's ring & esophageal motility disorder. May require repeat esophageal dilation.      Rectal bleeding   Constipation   Last Assessment & Plan 08/06/2014 Office Visit Written 08/07/2014  1:44 PM by Mahala Menghini, PA-C    Trial of Amitiza 91mcg BID with food. Stop Fiber and miralax which are likely contributing to her abdominal bloating/gas.      Esophageal dysphagia   Last Assessment & Plan 08/04/2015 Office Visit Written 08/04/2015  4:25 PM by Mahala Menghini, PA-C    Improved status post dilation.      Gastric polyp   Chronic diarrhea   Last Assessment & Plan 08/04/2015 Office Visit Written 08/04/2015  4:26 PM by Mahala Menghini, PA-C    Resolved. Planes of postprandial abdominal bloating and gas. FODMAP diet. Add probiotic daily for 4 weeks. Call with ongoing symptoms.      Gastroesophageal reflux disease without esophagitis     Nervous and Auditory   CSF leak   Last Assessment & Plan 10/12/2015 Nursing Home Written 10/12/2015 12:02 PM by Hendricks Limes, MD    Follow-up with Dr. Joya Salm 3 weeks post discharge      Meningitis   Last Assessment & Plan 10/12/2015 Nursing Home Written 10/12/2015 12:01 PM by Hendricks Limes, MD    Monitor for symptoms/signs of infection      Mild cognitive impairment     Musculoskeletal and Integument   Spondylolisthesis of lumbar region   Last Assessment & Plan 10/20/2015 Nursing Home Written 10/20/2015  3:24 PM by Hendricks Limes, MD    As per Dr. Joya Salm, NS        Genitourinary   Acute kidney failure [facility])     Hematopoietic and Hemostatic   Thrombocytosis ([place])     Other    HYPERLIPIDEMIA-MIXED   Last Assessment & Plan 08/16/2011 Office Visit Written 08/16/2011  2:57 PM by Donney Dice, PA    Patient  has documented intolerance to statins, most recently Crestor. Followup labs drawn yesterday in Dr. Edythe Lynn office, to whom we will defer future monitoring/management. Aggressive  treatment recommended, however, with target LDL 70 or less, if feasible.       PANIC ATTACK   FATIGUE   DYSPNEA   Last Assessment & Plan 02/12/2014 Office Visit Edited 02/12/2014  8:15 PM by Tanda Rockers, MD    - 02/12/2014  Walked RA x 1 lap  @ 185 ft each stopped due to  Sob, no desat, moderate pace  - 02/12/2014 spirometry >   FEV1  1.41 (83%) ratio 83   Symptoms are markedly disproportionate to objective findings and not clear this is a lung problem but pt does appear to have difficult  airway management issues. DDX of  difficult airways management all start with A and  include Adherence, Ace Inhibitors, Acid Reflux, Active Sinus Disease, Alpha 1 Antitripsin deficiency, Anxiety masquerading as Airways dz,  ABPA,  allergy(esp in young), Aspiration (esp in elderly), Adverse effects of DPI,  Active smokers, plus two Bs  = Bronchiectasis and Beta blocker use..and one C= CHF  ? Acid (or non-acid) GERD > always difficult to exclude as up to 75% of pts in some series report no assoc GI/ Heartburn symptoms> rec max (24h)  acid suppression and diet restrictions/ reviewed and instructions given in writing.   ? Anxiety/ depression/ deconditioning all playing a role but not easily corrected.   ? chf > note breathing was better p stopped smoking until cardiac issues and wt gain > already f/u by cards, not decompensated at present  No evidence of a pulmonary problem here so f/u can be prn.      Major depression [facility])   Last Assessment & Plan 09/28/2010 Office Visit Written 09/28/2010  1:59 PM by Ezra Sites, MD    The patient's depression is quite severe she has been on Cymbalta and takes  alprazolam for her anxiety attacks. She is more depressed and has significant insomnia. I added trazodone 50 mg by mouth each bedtimeboth as a sleeping aid as well as augmentation therapy for her depression.      Hematochezia   Last Assessment & Plan 02/13/2011 Office Visit Edited 02/14/2011  8:03 PM by Andria Meuse, NP    This is concerning.  Will most likely need colonoscopy to determine etiology since last colonoscopy over 3 yrs ago after CT for further evaluation.      Lower extremity edema   Hypokalemia   Normocytic anemia   Last Assessment & Plan 08/06/2014 Office Visit Written 08/07/2014  1:45 PM by Mahala Menghini, PA-C    Noted during recent admission. Will recheck at this time.       Weight gain   Pain in the chest   Last Assessment & Plan 10/20/2015 Nursing Home Edited 10/20/2015  3:23 PM by Hendricks Limes, MD    She is on carvedilol ,a beta blocker Consider long-acting nitroglycerin if symptoms recur      Hyperlipidemia   Diarrhea   Last Assessment & Plan 06/11/2015 Office Visit Written 06/11/2015  1:21 PM by Mahala Menghini, PA-C    Six-month history of change in bowel habits, frequent profuse diarrhea, debilitating at times. Afraid to leave her house. Remote history of microscopic colitis. Recent workup negative for infectious etiology. Abnormal small bowel biopsies in the remote past but not consistent with celiac.  Recommend EGD with small bowel biopsy and colonoscopy with random colon biopsies in the near future. She will require deep sedation given her prior requirements and polypharmacy.  I have discussed the risks, alternatives, benefits with regards to but not limited to the risk of reaction to medication, bleeding, infection, perforation and the patient is agreeable to proceed. Written consent to be obtained.       History of colonic polyps   Iron deficiency anemia due to chronic blood loss   Chest pain at rest   Chronic low back pain   Hyponatremia    Hyperglycemia   Chest pain   Depression   Anxiety       Imaging: Dg Chest 2 View  10/15/2015  CLINICAL DATA:  Substernal chest pain and pressure since 1140 hours, shortness of breath, increased pain  with inspiration, history smoking, pneumonia, hypertension, coronary disease post MI, CHF EXAM: CHEST  2 VIEW COMPARISON:  02/21/2015 FINDINGS: Upper normal heart size. Atherosclerotic calcification aorta. Mediastinal contours and pulmonary vascularity normal. Lungs clear. No pleural effusion or pneumothorax. Bones demineralized. Old healed fracture lateral LEFT sixth rib. IMPRESSION: No acute abnormalities. Aortic atherosclerosis. Electronically Signed   By: Lavonia Dana M.D.   On: 10/15/2015 13:10   Dg Lumbar Spine Complete  10/27/2015  CLINICAL DATA:  Left posterior hip pain goes down left leg. EXAM: LUMBAR SPINE - COMPLETE 4+ VIEW COMPARISON:  Intraoperative films from 09/24/2015 FINDINGS: patient is status post PLIF at L3-4 and L4-5. Bilateral pedicle screws seen at L3, L4, L5 with fusion bars bilaterally. No evidence for hardware complications. Posterior lateral bone graft material evident. Bony alignment stable compared to preoperative film 08/12/2015.There is no evidence of lumbar spine fracture. Alignment is normal. Intervertebral disc spaces are maintained. IMPRESSION: Status post PLIF from L3-L5 without evidence for hardware complications. Electronically Signed   By: Misty Stanley M.D.   On: 10/27/2015 12:52   Ct Angio Chest Pe W/cm &/or Wo Cm  10/15/2015  CLINICAL DATA:  82 year old female with substernal chest pain and shortness of breath EXAM: CT ANGIOGRAPHY CHEST WITH CONTRAST TECHNIQUE: Multidetector CT imaging of the chest was performed using the standard protocol during bolus administration of intravenous contrast. Multiplanar CT image reconstructions and MIPs were obtained to evaluate the vascular anatomy. CONTRAST:  80 mL Isovue 370 COMPARISON:  Chest x-ray 10/15/2015; prior CT scan of  the chest 07/29/2014 FINDINGS: Mediastinum: Unremarkable CT appearance of the thyroid gland. No suspicious mediastinal or hilar adenopathy. No soft tissue mediastinal mass. The thoracic esophagus is unremarkable. Heart/Vascular: Adequate opacification of the pulmonary arteries to the segmental level. Evaluation of the subsegmental branches is limited by respiratory motion artifact. No focal filling defect to suggest acute pulmonary embolus. Normal caliber thoracic aorta. Two vessel anatomy. The right brachiocephalic and left common carotid artery share a common origin. No evidence of dissection. Calcifications present along the course of the coronary arteries. The heart is normal in size. There is no pericardial effusion. Lungs/Pleura: No evidence of pleural effusion. Evaluation for small pulmonary nodules is limited by excessive respiratory motion artifact. There may be very mild ground-glass attenuation opacity in the lung apices bilaterally. Stable nodularity in the periphery of the right middle lobe just inferior to the minor fissure. Bones/Soft Tissues: No acute fracture or aggressive appearing lytic or blastic osseous lesion. Upper Abdomen: Visualized upper abdominal organs are unremarkable. Review of the MIP images confirms the above findings. IMPRESSION: 1. Negative for acute pulmonary embolus, pneumonia or other acute cardiopulmonary process. 2. Perhaps trace ground-glass attenuation opacity in the lung apices which may reflect subsegmental atelectasis or very early edema. 3. Coronary artery calcifications. Electronically Signed   By: Jacqulynn Cadet M.D.   On: 10/15/2015 15:45   Nm Myocar Multi W/spect W/wall Motion / Ef  10/07/2015  CLINICAL DATA:  Chest pain. History of cardiac murmur, hypertension and shortness of breath. Recent lumbar fusion. EXAM: MYOCARDIAL IMAGING WITH SPECT (REST AND PHARMACOLOGIC-STRESS) GATED LEFT VENTRICULAR WALL MOTION STUDY LEFT VENTRICULAR EJECTION FRACTION TECHNIQUE:  Standard myocardial SPECT imaging was performed after resting intravenous injection of 10 mCi Tc-79m tetrofosmin. Subsequently, intravenous infusion of Lexiscan was performed under the supervision of the Cardiology staff. At peak effect of the drug, 30 mCi Tc-25m tetrofosmin was injected intravenously and standard myocardial SPECT imaging was performed. Quantitative gated imaging was also performed to evaluate left  ventricular wall motion, and estimate left ventricular ejection fraction. COMPARISON:  Chest CT 07/29/2014. Nuclear medicine myocardial scan 11/25/2013. FINDINGS: Perfusion: There is mildly decreased activity at the apex and within the septum which appears fixed. There is no associated wall motion abnormality, and this attributed to thinning. No reversible perfusion defects. Wall Motion: Normal left ventricular wall motion. No left ventricular dilation. Left Ventricular Ejection Fraction: 66 % End diastolic volume 46 ml End systolic volume 16 ml IMPRESSION: 1. No reversible ischemia or infarction. Apical and septal wall thinning. 2. Normal left ventricular wall motion. 3. Left ventricular ejection fraction 66% 4. Non invasive risk stratification*: Low *2012 Appropriate Use Criteria for Coronary Revascularization Focused Update: J Am Coll Cardiol. [id number]. http://content.airportbarriers.com.aspx?articleid=[email or web address] Electronically Signed   By: Richardean Sale M.D.   On: 10/07/2015 13:32   Dg Hip Unilat With Pelvis 2-3 Views Left  10/27/2015  CLINICAL DATA:  Left hip pain, initial encounter EXAM: DG HIP (WITH OR WITHOUT PELVIS) 2-3V LEFT COMPARISON:  None. FINDINGS: There is no evidence of hip fracture or dislocation. There is no evidence of arthropathy or other focal bone abnormality. IMPRESSION: No acute abnormality noted. Electronically Signed   By: Inez Catalina M.D.   On: 10/27/2015 12:32

## 2015-11-05 NOTE — Progress Notes (Signed)
Cardiology Office Note   Date:  11/05/2015   ID:  HONESTI PATSCHKE, DOB 04-Jul-1932, MRN [id number]  PCP:  [name]  Cardiologist: Woodroe Chen, NP   No chief complaint on file.     History of Present Illness: [name] is a 82 y.o. female who presents for ongoing assessment and management of CAD, with history of drug-eluting stents the left circumflex in January 2015, proximal LAD showed mild nonobstructive disease with moderately severe disease in a small nondominant right coronary artery. Other history of hypertension, hyperlipidemia, chronic diastolic heart failure, depression and severe panic attacks.  Has been seen in the ER for left hip pain after having lumbar surgery and underwent facetotomy and foraminotomies with decompression at L3-4 and L4-5 on 09/25/2015 and had a operative CSF leak repaired on 09/27/2015. She was admitted to the hospital on October 16, 2015 for recurrent chest pain.She was ruled out for cardiac etiology of pain.   She comes today with continued pain in her back, hips, and lower legs. She has finished rehab but has not followed up with her back surgeon, Dr. Joya Salm. She is requesting a hospital bed. Complains of some dizziness and memory issues.     Past Medical History  Diagnosis Date  . PVD (peripheral vascular disease) ([place])   . Other and unspecified hyperlipidemia   . Panic disorder without agoraphobia   . HTN (hypertension)     Hx of it  . MI (myocardial infarction) ([place]) 2006  . Internal hemorrhoids without mention of complication   . Dysphagia, unspecified(787.20)   . Microscopic colitis 2003  . GERD (gastroesophageal reflux disease)     Hx Schatzki's ring, multiple EGD/ED last 01/06/2004  . Hyperlipemia   . Thyroid disease     recent abnl TSH per pt  . S/P colonoscopy 09/27/2001    internal hemorrhoids, desc colon inflam polyp, SB BX-chronic duodenitis, colitis  . Arthritis   . Shortness of breath   . Heart disease    . Bursitis     left shoulder  . Hyperlipidemia   . Sleep disorder     obstructive  . COPD (chronic obstructive pulmonary disease) ([place])   . Complication of anesthesia   . PONV (postoperative nausea and vomiting)     'a little nausea"  . Anxiety   . Depression   . Pneumonia 12/2011  . Heart murmur     'a littel'  . Cataract   . Paresthesia     hands, feet  . CHF (congestive heart failure) ([place])   . Headache   . Coronary atherosclerosis of native coronary artery     a. DES to CX, moderately severe stenosis RCA, mild stenosis LAD 04/2013    Past Surgical History  Procedure Laterality Date  . Tonsillectomy    . Unspecified area, hysterectomy  1972    partial  . Cholecystectomy  1998  . Right knee replacement  2007  . Right leg benign tumor    . Breast lumpectomy  1998    left, benign  . Left hand surgery    . Left rotator cuff surgery    . Nasal sinus surgery    . Bladder tack  06/2010  . Maloney dilation  03/16/2011    Gastritis. No H.pylori on bx. 33F maloney dilation with disruption of  occult cevical esophageal web  . Colonoscopy  03/16/2011    multiple hyperplastic colon polyps, sigmoid diverticulosis, melanosis coli  . Bladder suspension  11/09/2011  Procedure: TRANSVAGINAL TAPE (TVT) PROCEDURE;  Surgeon: Marissa Nestle, MD;  Location: [facility];  Service: Urology;  Laterality: N/A;  . Abdominal hysterectomy    . Appendectomy    . Carpal tunnel release  1989    left  . Anterior and posterior repair      with resection of vagina  . Shoulder surgery Left   . Joint replacement Right 2007  . Lumbar laminectomy/decompression microdiscectomy N/A 10/11/2012    Procedure: LUMBAR LAMINECTOMY/DECOMPRESSION MICRODISCECTOMY 2 LEVELS;  Surgeon: Floyce Stakes, MD;  Location: [facility];  Service: Neurosurgery;  Laterality: N/A;  L3-4 L4-5 Laminectomy  . Left heart catheterization with coronary angiogram N/A 05/14/2013    Procedure: LEFT HEART CATHETERIZATION WITH CORONARY  ANGIOGRAM;  Surgeon: Blane Ohara, MD;  Location: [facility] CATH LAB;  Service: Cardiovascular;  Laterality: N/A;  . Cardiac catheterization    . Cardiac catheterization    . Coronary angioplasty with stent placement    . Colonoscopy with propofol N/A 07/05/2015    RMR:one 5 mm polyp in descending colon  . Esophagogastroduodenoscopy (egd) with propofol N/A 07/05/2015    [id number]  . Esophageal dilation N/A 07/05/2015    Procedure: ESOPHAGEAL DILATION;  Surgeon: Daneil Dolin, MD;  Location: [facility];  Service: Endoscopy;  Laterality: N/A;  . Biopsy  07/05/2015    Procedure: BIOPSY;  Surgeon: Daneil Dolin, MD;  Location: [facility];  Service: Endoscopy;;  gastric polyp biopsy, ascending colon biopsy  . Lumbar wound debridement N/A 09/27/2015    Procedure: Exploration of Lumbar Wound w/ Repair CSF Leak/Lumbar Drain Placement;  Surgeon: Leeroy Cha, MD;  Location: [facility] OR[facility];  Service: Neurosurgery;  Laterality: N/A;     Current Outpatient Prescriptions  Medication Sig Dispense Refill  . acetaminophen (TYLENOL) 500 MG tablet Take 500 mg by mouth daily as needed for headache.     . ALPRAZolam (XANAX) 0.25 MG tablet Take 0.25 mg by mouth 2 (two) times daily as needed for anxiety.     [name] Kitchen aspirin 81 MG EC tablet Take 81 mg by mouth every morning.     . Carbamide Peroxide (EAR WAX DROPS OT) 4 drops into both ears once a day on Tue, Sat.  0  . carvedilol (COREG) 25 MG tablet Take 25 mg by mouth 2 (two) times daily with a meal.    . diclofenac sodium (VOLTAREN) 1 % GEL Apply 1 g topical to residents back as needed    . DULoxetine (CYMBALTA) 60 MG capsule Take 60 mg by mouth at bedtime.     . fluticasone (FLONASE) 50 MCG/ACT nasal spray Place 2 sprays into both nostrils daily as needed for allergies.     . furosemide (LASIX) 20 MG tablet Take 1 tablet (20 mg total) by mouth daily. 90 tablet 3  . gabapentin (NEURONTIN) 300 MG capsule Take 1 capsule (300 mg total) by mouth 3 (three) times  daily. 90 capsule 2  . loperamide (IMODIUM A-D) 2 MG tablet Take 2 mg by mouth 4 (four) times daily as needed for diarrhea or loose stools.    . Magnesium Hydroxide (MILK OF MAGNESIA PO) Milk of magnesia 30 ml by mouth daily as needed for constipation, may try fleets enema    . nitroGLYCERIN (NITROSTAT) 0.4 MG SL tablet Place 0.4 mg under the tongue every 5 (five) minutes as needed for chest pain. Reported on 08/04/2015    . NON FORMULARY Magic Cup once daily on dinner tray    .  oxyCODONE-acetaminophen (PERCOCET/ROXICET) 5-325 MG tablet Take 1-2 tablets by mouth every 4 (four) hours as needed for moderate pain. Max APAP 3gm/24hours from all sources 360 tablet 0  . pantoprazole (PROTONIX) 40 MG tablet Take 40 mg by mouth every morning.     . phenylephrine-shark liver oil-mineral oil-petrolatum (PREPARATION H) 0.25-14-74.9 % rectal ointment Apply to rectal area at bedtime    . potassium chloride (K-DUR,KLOR-CON) 10 MEQ tablet TAKE TWO (2) TABLETS BY MOUTH DAILY. 60 tablet 6  . Probiotic Product (RISA-BID PROBIOTIC PO) Tablet; 1 billion-250 cell-mg; oral once a day     No current facility-administered medications for this visit.    Allergies:   Cephalosporins; Levaquin; Macrodantin; Phenothiazines; Polysorbate; Prednisone; Buspirone; Cardura; Codeine; Acyclovir and related; Prochlorperazine; Ranexa; Atorvastatin; Ofloxacin; Other; Penicillins; and Pimozide    Social History:  The patient  reports that she quit smoking about 13 years ago. Her smoking use included Cigarettes. She started smoking about 67 years ago. She has a 64 pack-year smoking history. She has never used smokeless tobacco. She reports that she does not drink alcohol or use illicit drugs.   Family History:  The patient's family history includes Cancer in her other; Coronary artery disease in her other; Diabetes in her brother and son; Heart disease in her son; Hypertension in her other; Stroke in her mother and other; Stroke (age of  onset: 72) in her daughter.    ROS: All other systems are reviewed and negative. Unless otherwise mentioned in H&P    PHYSICAL EXAM: VS:  BP 142/58 mmHg  Pulse 83  Ht 5\' 1"  (1.549 m)  Wt 174 lb 12.8 oz (79.289 kg)  BMI 33.05 kg/m2  SpO2 93% , BMI Body mass index is 33.05 kg/(m^2). GEN: Well nourished, well developed, in no acute distress HEENT: normal Neck: no JVD, carotid bruits, or masses Cardiac: RRR; no murmurs, rubs, or gallops,no edema  Respiratory:  Bibasilar crackles,  GI: soft, nontender, nondistended, + BS MS: no deformity or atrophyWearing brace for back.  Skin: warm and dry, no rash Neuro:  Strength and sensation are intact Psych: euthymic mood, full affect   Recent Labs: 02/21/2015: ALT 14 10/06/2015: Magnesium 1.9 10/15/2015: B Natriuretic Peptide 59.0; Hemoglobin 10.0*; Platelets 432* 10/16/2015: BUN 24*; Creatinine, Ser 1.14*; Potassium 4.3; Sodium 137    Lipid Panel    Component Value Date/Time   CHOL 181 11/24/2013 1110   TRIG 111 11/24/2013 1110   HDL 56 11/24/2013 1110   CHOLHDL 3.2 11/24/2013 1110   VLDL 22 11/24/2013 1110   LDLCALC 103* 11/24/2013 1110      Wt Readings from Last 3 Encounters:  11/05/15 174 lb 12.8 oz (79.289 kg)  10/27/15 174 lb (78.926 kg)  10/20/15 174 lb (78.926 kg)      ASSESSMENT AND PLAN:  1.  Hypertension: Currently well controlled. Will not make any changes in her medications at this time.  2. Chronic Back Pain: Multiple issues with this, with ongoing pain. Becoming dependent on percocet. I will give her refill on Voltaren Gel 1%. She is advised to wean down from the percocet and take tylenol instead. She is referred back to her surgeon , Dr. Joya Salm for ongoing management and to see PCP on previously scheduled appointment. There is no need for hospital bed from cardiac standpoint, but may be able to through her PCP or surgeon.    Current medicines are reviewed at length with the patient today.    Labs/ tests ordered  today include: No orders of  the defined types were placed in this encounter.     Disposition:   FU with 6 months.  Signed, Jory Sims, NP  11/05/2015 1:31 PM    [facility] [address], [place], [place] [address] Phone: [phone]; Fax: [phone]

## 2015-11-08 DIAGNOSIS — H2512 Age-related nuclear cataract, left eye: Secondary | ICD-10-CM | POA: Diagnosis not present

## 2015-11-08 DIAGNOSIS — H2511 Age-related nuclear cataract, right eye: Secondary | ICD-10-CM | POA: Diagnosis not present

## 2015-11-08 DIAGNOSIS — I509 Heart failure, unspecified: Secondary | ICD-10-CM | POA: Diagnosis not present

## 2015-11-08 DIAGNOSIS — H25012 Cortical age-related cataract, left eye: Secondary | ICD-10-CM | POA: Diagnosis not present

## 2015-11-08 DIAGNOSIS — I110 Hypertensive heart disease with heart failure: Secondary | ICD-10-CM | POA: Diagnosis not present

## 2015-11-08 DIAGNOSIS — G970 Cerebrospinal fluid leak from spinal puncture: Secondary | ICD-10-CM | POA: Diagnosis not present

## 2015-11-08 DIAGNOSIS — H25011 Cortical age-related cataract, right eye: Secondary | ICD-10-CM | POA: Diagnosis not present

## 2015-11-08 DIAGNOSIS — I2510 Atherosclerotic heart disease of native coronary artery without angina pectoris: Secondary | ICD-10-CM | POA: Diagnosis not present

## 2015-11-08 DIAGNOSIS — I480 Paroxysmal atrial fibrillation: Secondary | ICD-10-CM | POA: Diagnosis not present

## 2015-11-08 DIAGNOSIS — G039 Meningitis, unspecified: Secondary | ICD-10-CM | POA: Diagnosis not present

## 2015-11-09 DIAGNOSIS — I2510 Atherosclerotic heart disease of native coronary artery without angina pectoris: Secondary | ICD-10-CM | POA: Diagnosis not present

## 2015-11-09 DIAGNOSIS — I480 Paroxysmal atrial fibrillation: Secondary | ICD-10-CM | POA: Diagnosis not present

## 2015-11-09 DIAGNOSIS — I110 Hypertensive heart disease with heart failure: Secondary | ICD-10-CM | POA: Diagnosis not present

## 2015-11-09 DIAGNOSIS — G970 Cerebrospinal fluid leak from spinal puncture: Secondary | ICD-10-CM | POA: Diagnosis not present

## 2015-11-09 DIAGNOSIS — I509 Heart failure, unspecified: Secondary | ICD-10-CM | POA: Diagnosis not present

## 2015-11-09 DIAGNOSIS — G039 Meningitis, unspecified: Secondary | ICD-10-CM | POA: Diagnosis not present

## 2015-11-10 DIAGNOSIS — M4316 Spondylolisthesis, lumbar region: Secondary | ICD-10-CM | POA: Diagnosis not present

## 2015-11-10 DIAGNOSIS — Z6833 Body mass index (BMI) 33.0-33.9, adult: Secondary | ICD-10-CM | POA: Diagnosis not present

## 2015-11-11 DIAGNOSIS — G039 Meningitis, unspecified: Secondary | ICD-10-CM | POA: Diagnosis not present

## 2015-11-11 DIAGNOSIS — G970 Cerebrospinal fluid leak from spinal puncture: Secondary | ICD-10-CM | POA: Diagnosis not present

## 2015-11-11 DIAGNOSIS — I509 Heart failure, unspecified: Secondary | ICD-10-CM | POA: Diagnosis not present

## 2015-11-11 DIAGNOSIS — I110 Hypertensive heart disease with heart failure: Secondary | ICD-10-CM | POA: Diagnosis not present

## 2015-11-11 DIAGNOSIS — I2510 Atherosclerotic heart disease of native coronary artery without angina pectoris: Secondary | ICD-10-CM | POA: Diagnosis not present

## 2015-11-11 DIAGNOSIS — I480 Paroxysmal atrial fibrillation: Secondary | ICD-10-CM | POA: Diagnosis not present

## 2015-11-14 DIAGNOSIS — I351 Nonrheumatic aortic (valve) insufficiency: Secondary | ICD-10-CM | POA: Insufficient documentation

## 2015-11-14 DIAGNOSIS — R296 Repeated falls: Secondary | ICD-10-CM | POA: Insufficient documentation

## 2015-11-15 DIAGNOSIS — I2510 Atherosclerotic heart disease of native coronary artery without angina pectoris: Secondary | ICD-10-CM | POA: Diagnosis not present

## 2015-11-15 DIAGNOSIS — I110 Hypertensive heart disease with heart failure: Secondary | ICD-10-CM | POA: Diagnosis not present

## 2015-11-15 DIAGNOSIS — I480 Paroxysmal atrial fibrillation: Secondary | ICD-10-CM | POA: Diagnosis not present

## 2015-11-15 DIAGNOSIS — G039 Meningitis, unspecified: Secondary | ICD-10-CM | POA: Diagnosis not present

## 2015-11-15 DIAGNOSIS — I509 Heart failure, unspecified: Secondary | ICD-10-CM | POA: Diagnosis not present

## 2015-11-15 DIAGNOSIS — M533 Sacrococcygeal disorders, not elsewhere classified: Secondary | ICD-10-CM | POA: Diagnosis not present

## 2015-11-15 DIAGNOSIS — G970 Cerebrospinal fluid leak from spinal puncture: Secondary | ICD-10-CM | POA: Diagnosis not present

## 2015-11-16 DIAGNOSIS — I2510 Atherosclerotic heart disease of native coronary artery without angina pectoris: Secondary | ICD-10-CM | POA: Diagnosis not present

## 2015-11-16 DIAGNOSIS — I110 Hypertensive heart disease with heart failure: Secondary | ICD-10-CM | POA: Diagnosis not present

## 2015-11-16 DIAGNOSIS — G039 Meningitis, unspecified: Secondary | ICD-10-CM | POA: Diagnosis not present

## 2015-11-16 DIAGNOSIS — G970 Cerebrospinal fluid leak from spinal puncture: Secondary | ICD-10-CM | POA: Diagnosis not present

## 2015-11-16 DIAGNOSIS — I480 Paroxysmal atrial fibrillation: Secondary | ICD-10-CM | POA: Diagnosis not present

## 2015-11-16 DIAGNOSIS — I509 Heart failure, unspecified: Secondary | ICD-10-CM | POA: Diagnosis not present

## 2015-11-17 DIAGNOSIS — I2510 Atherosclerotic heart disease of native coronary artery without angina pectoris: Secondary | ICD-10-CM | POA: Diagnosis not present

## 2015-11-17 DIAGNOSIS — G039 Meningitis, unspecified: Secondary | ICD-10-CM | POA: Diagnosis not present

## 2015-11-17 DIAGNOSIS — I110 Hypertensive heart disease with heart failure: Secondary | ICD-10-CM | POA: Diagnosis not present

## 2015-11-17 DIAGNOSIS — I480 Paroxysmal atrial fibrillation: Secondary | ICD-10-CM | POA: Diagnosis not present

## 2015-11-17 DIAGNOSIS — I509 Heart failure, unspecified: Secondary | ICD-10-CM | POA: Diagnosis not present

## 2015-11-17 DIAGNOSIS — G970 Cerebrospinal fluid leak from spinal puncture: Secondary | ICD-10-CM | POA: Diagnosis not present

## 2015-11-18 DIAGNOSIS — I2510 Atherosclerotic heart disease of native coronary artery without angina pectoris: Secondary | ICD-10-CM | POA: Diagnosis not present

## 2015-11-18 DIAGNOSIS — G970 Cerebrospinal fluid leak from spinal puncture: Secondary | ICD-10-CM | POA: Diagnosis not present

## 2015-11-18 DIAGNOSIS — I110 Hypertensive heart disease with heart failure: Secondary | ICD-10-CM | POA: Diagnosis not present

## 2015-11-18 DIAGNOSIS — I509 Heart failure, unspecified: Secondary | ICD-10-CM | POA: Diagnosis not present

## 2015-11-18 DIAGNOSIS — I480 Paroxysmal atrial fibrillation: Secondary | ICD-10-CM | POA: Diagnosis not present

## 2015-11-18 DIAGNOSIS — G039 Meningitis, unspecified: Secondary | ICD-10-CM | POA: Diagnosis not present

## 2015-11-23 DIAGNOSIS — H25012 Cortical age-related cataract, left eye: Secondary | ICD-10-CM | POA: Diagnosis not present

## 2015-11-23 DIAGNOSIS — H2512 Age-related nuclear cataract, left eye: Secondary | ICD-10-CM | POA: Diagnosis not present

## 2015-11-23 DIAGNOSIS — H25812 Combined forms of age-related cataract, left eye: Secondary | ICD-10-CM | POA: Diagnosis not present

## 2015-11-24 DIAGNOSIS — G039 Meningitis, unspecified: Secondary | ICD-10-CM | POA: Diagnosis not present

## 2015-11-24 DIAGNOSIS — I480 Paroxysmal atrial fibrillation: Secondary | ICD-10-CM | POA: Diagnosis not present

## 2015-11-24 DIAGNOSIS — I2510 Atherosclerotic heart disease of native coronary artery without angina pectoris: Secondary | ICD-10-CM | POA: Diagnosis not present

## 2015-11-24 DIAGNOSIS — I110 Hypertensive heart disease with heart failure: Secondary | ICD-10-CM | POA: Diagnosis not present

## 2015-11-24 DIAGNOSIS — G970 Cerebrospinal fluid leak from spinal puncture: Secondary | ICD-10-CM | POA: Diagnosis not present

## 2015-11-24 DIAGNOSIS — I509 Heart failure, unspecified: Secondary | ICD-10-CM | POA: Diagnosis not present

## 2015-11-25 DIAGNOSIS — I110 Hypertensive heart disease with heart failure: Secondary | ICD-10-CM | POA: Diagnosis not present

## 2015-11-25 DIAGNOSIS — I509 Heart failure, unspecified: Secondary | ICD-10-CM | POA: Diagnosis not present

## 2015-11-25 DIAGNOSIS — I2510 Atherosclerotic heart disease of native coronary artery without angina pectoris: Secondary | ICD-10-CM | POA: Diagnosis not present

## 2015-11-25 DIAGNOSIS — G970 Cerebrospinal fluid leak from spinal puncture: Secondary | ICD-10-CM | POA: Diagnosis not present

## 2015-11-25 DIAGNOSIS — G039 Meningitis, unspecified: Secondary | ICD-10-CM | POA: Diagnosis not present

## 2015-11-25 DIAGNOSIS — Z6832 Body mass index (BMI) 32.0-32.9, adult: Secondary | ICD-10-CM | POA: Diagnosis not present

## 2015-11-25 DIAGNOSIS — I480 Paroxysmal atrial fibrillation: Secondary | ICD-10-CM | POA: Diagnosis not present

## 2015-11-25 DIAGNOSIS — M79672 Pain in left foot: Secondary | ICD-10-CM | POA: Diagnosis not present

## 2015-11-26 DIAGNOSIS — I2510 Atherosclerotic heart disease of native coronary artery without angina pectoris: Secondary | ICD-10-CM | POA: Diagnosis not present

## 2015-11-26 DIAGNOSIS — G039 Meningitis, unspecified: Secondary | ICD-10-CM | POA: Diagnosis not present

## 2015-11-26 DIAGNOSIS — I110 Hypertensive heart disease with heart failure: Secondary | ICD-10-CM | POA: Diagnosis not present

## 2015-11-26 DIAGNOSIS — I480 Paroxysmal atrial fibrillation: Secondary | ICD-10-CM | POA: Diagnosis not present

## 2015-11-26 DIAGNOSIS — I509 Heart failure, unspecified: Secondary | ICD-10-CM | POA: Diagnosis not present

## 2015-11-26 DIAGNOSIS — G970 Cerebrospinal fluid leak from spinal puncture: Secondary | ICD-10-CM | POA: Diagnosis not present

## 2015-11-29 DIAGNOSIS — M533 Sacrococcygeal disorders, not elsewhere classified: Secondary | ICD-10-CM | POA: Diagnosis not present

## 2015-11-29 DIAGNOSIS — M545 Low back pain: Secondary | ICD-10-CM | POA: Diagnosis not present

## 2015-11-29 DIAGNOSIS — I10 Essential (primary) hypertension: Secondary | ICD-10-CM | POA: Diagnosis not present

## 2015-11-29 DIAGNOSIS — Z6833 Body mass index (BMI) 33.0-33.9, adult: Secondary | ICD-10-CM | POA: Diagnosis not present

## 2015-11-30 DIAGNOSIS — I110 Hypertensive heart disease with heart failure: Secondary | ICD-10-CM | POA: Diagnosis not present

## 2015-11-30 DIAGNOSIS — I480 Paroxysmal atrial fibrillation: Secondary | ICD-10-CM | POA: Diagnosis not present

## 2015-11-30 DIAGNOSIS — G039 Meningitis, unspecified: Secondary | ICD-10-CM | POA: Diagnosis not present

## 2015-11-30 DIAGNOSIS — G970 Cerebrospinal fluid leak from spinal puncture: Secondary | ICD-10-CM | POA: Diagnosis not present

## 2015-11-30 DIAGNOSIS — I509 Heart failure, unspecified: Secondary | ICD-10-CM | POA: Diagnosis not present

## 2015-11-30 DIAGNOSIS — I2510 Atherosclerotic heart disease of native coronary artery without angina pectoris: Secondary | ICD-10-CM | POA: Diagnosis not present

## 2015-12-01 DIAGNOSIS — I509 Heart failure, unspecified: Secondary | ICD-10-CM | POA: Diagnosis not present

## 2015-12-01 DIAGNOSIS — I2510 Atherosclerotic heart disease of native coronary artery without angina pectoris: Secondary | ICD-10-CM | POA: Diagnosis not present

## 2015-12-01 DIAGNOSIS — G970 Cerebrospinal fluid leak from spinal puncture: Secondary | ICD-10-CM | POA: Diagnosis not present

## 2015-12-01 DIAGNOSIS — I480 Paroxysmal atrial fibrillation: Secondary | ICD-10-CM | POA: Diagnosis not present

## 2015-12-01 DIAGNOSIS — I110 Hypertensive heart disease with heart failure: Secondary | ICD-10-CM | POA: Diagnosis not present

## 2015-12-01 DIAGNOSIS — N390 Urinary tract infection, site not specified: Secondary | ICD-10-CM | POA: Diagnosis not present

## 2015-12-01 DIAGNOSIS — G039 Meningitis, unspecified: Secondary | ICD-10-CM | POA: Diagnosis not present

## 2015-12-02 DIAGNOSIS — H2512 Age-related nuclear cataract, left eye: Secondary | ICD-10-CM | POA: Diagnosis not present

## 2015-12-03 DIAGNOSIS — I10 Essential (primary) hypertension: Secondary | ICD-10-CM | POA: Diagnosis not present

## 2015-12-03 DIAGNOSIS — I509 Heart failure, unspecified: Secondary | ICD-10-CM | POA: Diagnosis not present

## 2015-12-03 DIAGNOSIS — I5030 Unspecified diastolic (congestive) heart failure: Secondary | ICD-10-CM | POA: Diagnosis not present

## 2015-12-03 DIAGNOSIS — I110 Hypertensive heart disease with heart failure: Secondary | ICD-10-CM | POA: Diagnosis not present

## 2015-12-03 DIAGNOSIS — R079 Chest pain, unspecified: Secondary | ICD-10-CM | POA: Diagnosis not present

## 2015-12-03 DIAGNOSIS — F419 Anxiety disorder, unspecified: Secondary | ICD-10-CM | POA: Diagnosis not present

## 2015-12-03 DIAGNOSIS — I739 Peripheral vascular disease, unspecified: Secondary | ICD-10-CM | POA: Diagnosis not present

## 2015-12-03 DIAGNOSIS — I480 Paroxysmal atrial fibrillation: Secondary | ICD-10-CM | POA: Diagnosis not present

## 2015-12-03 DIAGNOSIS — G970 Cerebrospinal fluid leak from spinal puncture: Secondary | ICD-10-CM | POA: Diagnosis not present

## 2015-12-03 DIAGNOSIS — G039 Meningitis, unspecified: Secondary | ICD-10-CM | POA: Diagnosis not present

## 2015-12-03 DIAGNOSIS — K580 Irritable bowel syndrome with diarrhea: Secondary | ICD-10-CM | POA: Diagnosis not present

## 2015-12-03 DIAGNOSIS — I2510 Atherosclerotic heart disease of native coronary artery without angina pectoris: Secondary | ICD-10-CM | POA: Diagnosis not present

## 2015-12-03 DIAGNOSIS — K219 Gastro-esophageal reflux disease without esophagitis: Secondary | ICD-10-CM | POA: Diagnosis not present

## 2015-12-03 DIAGNOSIS — F329 Major depressive disorder, single episode, unspecified: Secondary | ICD-10-CM | POA: Diagnosis not present

## 2015-12-06 DIAGNOSIS — I2510 Atherosclerotic heart disease of native coronary artery without angina pectoris: Secondary | ICD-10-CM | POA: Diagnosis not present

## 2015-12-06 DIAGNOSIS — G039 Meningitis, unspecified: Secondary | ICD-10-CM | POA: Diagnosis not present

## 2015-12-06 DIAGNOSIS — R300 Dysuria: Secondary | ICD-10-CM | POA: Diagnosis not present

## 2015-12-06 DIAGNOSIS — I110 Hypertensive heart disease with heart failure: Secondary | ICD-10-CM | POA: Diagnosis not present

## 2015-12-06 DIAGNOSIS — G970 Cerebrospinal fluid leak from spinal puncture: Secondary | ICD-10-CM | POA: Diagnosis not present

## 2015-12-06 DIAGNOSIS — M79672 Pain in left foot: Secondary | ICD-10-CM | POA: Diagnosis not present

## 2015-12-06 DIAGNOSIS — I509 Heart failure, unspecified: Secondary | ICD-10-CM | POA: Diagnosis not present

## 2015-12-06 DIAGNOSIS — M7062 Trochanteric bursitis, left hip: Secondary | ICD-10-CM | POA: Diagnosis not present

## 2015-12-06 DIAGNOSIS — R5383 Other fatigue: Secondary | ICD-10-CM | POA: Diagnosis not present

## 2015-12-06 DIAGNOSIS — D519 Vitamin B12 deficiency anemia, unspecified: Secondary | ICD-10-CM | POA: Diagnosis not present

## 2015-12-06 DIAGNOSIS — I480 Paroxysmal atrial fibrillation: Secondary | ICD-10-CM | POA: Diagnosis not present

## 2015-12-06 DIAGNOSIS — D539 Nutritional anemia, unspecified: Secondary | ICD-10-CM | POA: Diagnosis not present

## 2015-12-06 DIAGNOSIS — Z6831 Body mass index (BMI) 31.0-31.9, adult: Secondary | ICD-10-CM | POA: Diagnosis not present

## 2015-12-07 DIAGNOSIS — I509 Heart failure, unspecified: Secondary | ICD-10-CM | POA: Diagnosis not present

## 2015-12-07 DIAGNOSIS — I480 Paroxysmal atrial fibrillation: Secondary | ICD-10-CM | POA: Diagnosis not present

## 2015-12-07 DIAGNOSIS — I2510 Atherosclerotic heart disease of native coronary artery without angina pectoris: Secondary | ICD-10-CM | POA: Diagnosis not present

## 2015-12-07 DIAGNOSIS — G970 Cerebrospinal fluid leak from spinal puncture: Secondary | ICD-10-CM | POA: Diagnosis not present

## 2015-12-07 DIAGNOSIS — I110 Hypertensive heart disease with heart failure: Secondary | ICD-10-CM | POA: Diagnosis not present

## 2015-12-07 DIAGNOSIS — G039 Meningitis, unspecified: Secondary | ICD-10-CM | POA: Diagnosis not present

## 2015-12-08 DIAGNOSIS — G5762 Lesion of plantar nerve, left lower limb: Secondary | ICD-10-CM | POA: Diagnosis not present

## 2015-12-08 DIAGNOSIS — M7062 Trochanteric bursitis, left hip: Secondary | ICD-10-CM | POA: Diagnosis not present

## 2015-12-08 DIAGNOSIS — M4726 Other spondylosis with radiculopathy, lumbar region: Secondary | ICD-10-CM | POA: Diagnosis not present

## 2015-12-09 DIAGNOSIS — I509 Heart failure, unspecified: Secondary | ICD-10-CM | POA: Diagnosis not present

## 2015-12-09 DIAGNOSIS — I110 Hypertensive heart disease with heart failure: Secondary | ICD-10-CM | POA: Diagnosis not present

## 2015-12-09 DIAGNOSIS — G039 Meningitis, unspecified: Secondary | ICD-10-CM | POA: Diagnosis not present

## 2015-12-09 DIAGNOSIS — G970 Cerebrospinal fluid leak from spinal puncture: Secondary | ICD-10-CM | POA: Diagnosis not present

## 2015-12-09 DIAGNOSIS — I2510 Atherosclerotic heart disease of native coronary artery without angina pectoris: Secondary | ICD-10-CM | POA: Diagnosis not present

## 2015-12-09 DIAGNOSIS — I480 Paroxysmal atrial fibrillation: Secondary | ICD-10-CM | POA: Diagnosis not present

## 2015-12-13 DIAGNOSIS — I480 Paroxysmal atrial fibrillation: Secondary | ICD-10-CM | POA: Diagnosis not present

## 2015-12-13 DIAGNOSIS — I110 Hypertensive heart disease with heart failure: Secondary | ICD-10-CM | POA: Diagnosis not present

## 2015-12-13 DIAGNOSIS — I2510 Atherosclerotic heart disease of native coronary artery without angina pectoris: Secondary | ICD-10-CM | POA: Diagnosis not present

## 2015-12-13 DIAGNOSIS — G039 Meningitis, unspecified: Secondary | ICD-10-CM | POA: Diagnosis not present

## 2015-12-13 DIAGNOSIS — I509 Heart failure, unspecified: Secondary | ICD-10-CM | POA: Diagnosis not present

## 2015-12-13 DIAGNOSIS — G970 Cerebrospinal fluid leak from spinal puncture: Secondary | ICD-10-CM | POA: Diagnosis not present

## 2015-12-14 DIAGNOSIS — I110 Hypertensive heart disease with heart failure: Secondary | ICD-10-CM | POA: Diagnosis not present

## 2015-12-14 DIAGNOSIS — I2510 Atherosclerotic heart disease of native coronary artery without angina pectoris: Secondary | ICD-10-CM | POA: Diagnosis not present

## 2015-12-14 DIAGNOSIS — G039 Meningitis, unspecified: Secondary | ICD-10-CM | POA: Diagnosis not present

## 2015-12-14 DIAGNOSIS — I509 Heart failure, unspecified: Secondary | ICD-10-CM | POA: Diagnosis not present

## 2015-12-14 DIAGNOSIS — I480 Paroxysmal atrial fibrillation: Secondary | ICD-10-CM | POA: Diagnosis not present

## 2015-12-14 DIAGNOSIS — G970 Cerebrospinal fluid leak from spinal puncture: Secondary | ICD-10-CM | POA: Diagnosis not present

## 2015-12-15 ENCOUNTER — Emergency Department (HOSPITAL_COMMUNITY): Payer: Medicare Other

## 2015-12-15 ENCOUNTER — Encounter (HOSPITAL_COMMUNITY): Payer: Self-pay

## 2015-12-15 ENCOUNTER — Inpatient Hospital Stay (HOSPITAL_COMMUNITY)
Admission: EM | Admit: 2015-12-15 | Discharge: 2015-12-18 | DRG: 191 | Disposition: A | Discharge status: Home Health | Payer: Medicare Other | Attending: Internal Medicine | Admitting: Internal Medicine

## 2015-12-15 DIAGNOSIS — E785 Hyperlipidemia, unspecified: Secondary | ICD-10-CM | POA: Diagnosis present

## 2015-12-15 DIAGNOSIS — R079 Chest pain, unspecified: Secondary | ICD-10-CM | POA: Diagnosis not present

## 2015-12-15 DIAGNOSIS — J189 Pneumonia, unspecified organism: Secondary | ICD-10-CM | POA: Diagnosis not present

## 2015-12-15 DIAGNOSIS — I509 Heart failure, unspecified: Secondary | ICD-10-CM | POA: Diagnosis not present

## 2015-12-15 DIAGNOSIS — G039 Meningitis, unspecified: Secondary | ICD-10-CM | POA: Diagnosis not present

## 2015-12-15 DIAGNOSIS — I5032 Chronic diastolic (congestive) heart failure: Secondary | ICD-10-CM | POA: Diagnosis present

## 2015-12-15 DIAGNOSIS — Z96651 Presence of right artificial knee joint: Secondary | ICD-10-CM | POA: Diagnosis present

## 2015-12-15 DIAGNOSIS — K219 Gastro-esophageal reflux disease without esophagitis: Secondary | ICD-10-CM | POA: Diagnosis present

## 2015-12-15 DIAGNOSIS — G970 Cerebrospinal fluid leak from spinal puncture: Secondary | ICD-10-CM | POA: Diagnosis not present

## 2015-12-15 DIAGNOSIS — R0789 Other chest pain: Secondary | ICD-10-CM

## 2015-12-15 DIAGNOSIS — J440 Chronic obstructive pulmonary disease with acute lower respiratory infection: Secondary | ICD-10-CM | POA: Diagnosis not present

## 2015-12-15 DIAGNOSIS — I739 Peripheral vascular disease, unspecified: Secondary | ICD-10-CM | POA: Diagnosis not present

## 2015-12-15 DIAGNOSIS — Z955 Presence of coronary angioplasty implant and graft: Secondary | ICD-10-CM

## 2015-12-15 DIAGNOSIS — Z7982 Long term (current) use of aspirin: Secondary | ICD-10-CM

## 2015-12-15 DIAGNOSIS — I2510 Atherosclerotic heart disease of native coronary artery without angina pectoris: Secondary | ICD-10-CM | POA: Diagnosis not present

## 2015-12-15 DIAGNOSIS — N179 Acute kidney failure, unspecified: Secondary | ICD-10-CM | POA: Diagnosis not present

## 2015-12-15 DIAGNOSIS — F419 Anxiety disorder, unspecified: Secondary | ICD-10-CM | POA: Diagnosis present

## 2015-12-15 DIAGNOSIS — Z881 Allergy status to other antibiotic agents status: Secondary | ICD-10-CM

## 2015-12-15 DIAGNOSIS — Z87891 Personal history of nicotine dependence: Secondary | ICD-10-CM | POA: Diagnosis not present

## 2015-12-15 DIAGNOSIS — F410 Panic disorder [episodic paroxysmal anxiety] without agoraphobia: Secondary | ICD-10-CM | POA: Diagnosis present

## 2015-12-15 DIAGNOSIS — Z880 Allergy status to penicillin: Secondary | ICD-10-CM

## 2015-12-15 DIAGNOSIS — I252 Old myocardial infarction: Secondary | ICD-10-CM

## 2015-12-15 DIAGNOSIS — Z8249 Family history of ischemic heart disease and other diseases of the circulatory system: Secondary | ICD-10-CM

## 2015-12-15 DIAGNOSIS — I110 Hypertensive heart disease with heart failure: Secondary | ICD-10-CM | POA: Diagnosis present

## 2015-12-15 DIAGNOSIS — R739 Hyperglycemia, unspecified: Secondary | ICD-10-CM | POA: Diagnosis present

## 2015-12-15 DIAGNOSIS — J209 Acute bronchitis, unspecified: Secondary | ICD-10-CM

## 2015-12-15 DIAGNOSIS — G3184 Mild cognitive impairment, so stated: Secondary | ICD-10-CM | POA: Diagnosis present

## 2015-12-15 DIAGNOSIS — R059 Cough, unspecified: Secondary | ICD-10-CM

## 2015-12-15 DIAGNOSIS — E871 Hypo-osmolality and hyponatremia: Secondary | ICD-10-CM | POA: Diagnosis present

## 2015-12-15 DIAGNOSIS — I480 Paroxysmal atrial fibrillation: Secondary | ICD-10-CM | POA: Diagnosis not present

## 2015-12-15 DIAGNOSIS — R05 Cough: Secondary | ICD-10-CM

## 2015-12-15 DIAGNOSIS — Z79899 Other long term (current) drug therapy: Secondary | ICD-10-CM

## 2015-12-15 DIAGNOSIS — Z888 Allergy status to other drugs, medicaments and biological substances status: Secondary | ICD-10-CM

## 2015-12-15 DIAGNOSIS — Z981 Arthrodesis status: Secondary | ICD-10-CM

## 2015-12-15 DIAGNOSIS — Z885 Allergy status to narcotic agent status: Secondary | ICD-10-CM

## 2015-12-15 DIAGNOSIS — F329 Major depressive disorder, single episode, unspecified: Secondary | ICD-10-CM | POA: Diagnosis present

## 2015-12-15 LAB — CBC WITH DIFFERENTIAL/PLATELET
Basophils Absolute: 0.1 10*3/uL (ref 0.0–0.1)
Basophils Relative: 1 %
Eosinophils Absolute: 0.4 10*3/uL (ref 0.0–0.7)
Eosinophils Relative: 6 %
HCT: 32.5 % — ABNORMAL LOW (ref 36.0–46.0)
Hemoglobin: 10.7 g/dL — ABNORMAL LOW (ref 12.0–15.0)
Lymphocytes Relative: 37 %
Lymphs Abs: 2.6 10*3/uL (ref 0.7–4.0)
MCH: 27.5 pg (ref 26.0–34.0)
MCHC: 32.9 g/dL (ref 30.0–36.0)
MCV: 83.5 fL (ref 78.0–100.0)
Monocytes Absolute: 0.4 10*3/uL (ref 0.1–1.0)
Monocytes Relative: 6 %
Neutro Abs: 3.6 10*3/uL (ref 1.7–7.7)
Neutrophils Relative %: 50 %
Platelets: 327 10*3/uL (ref 150–400)
RBC: 3.89 MIL/uL (ref 3.87–5.11)
RDW: 13.9 % (ref 11.5–15.5)
WBC: 7.1 10*3/uL (ref 4.0–10.5)

## 2015-12-15 LAB — BASIC METABOLIC PANEL
Anion gap: 10 (ref 5–15)
BUN: 20 mg/dL (ref 6–20)
CO2: 24 mmol/L (ref 22–32)
Calcium: 9.3 mg/dL (ref 8.9–10.3)
Chloride: 99 mmol/L — ABNORMAL LOW (ref 101–111)
Creatinine, Ser: 1.45 mg/dL — ABNORMAL HIGH (ref 0.44–1.00)
GFR calc Af Amer: 38 mL/min — ABNORMAL LOW (ref >60)
GFR calc non Af Amer: 33 mL/min — ABNORMAL LOW (ref >60)
Glucose, Bld: 149 mg/dL — ABNORMAL HIGH (ref 65–99)
Potassium: 3.5 mmol/L (ref 3.5–5.1)
Sodium: 133 mmol/L — ABNORMAL LOW (ref 135–145)

## 2015-12-15 LAB — TROPONIN I: Troponin I: <0.03 ng/mL (ref <0.03)

## 2015-12-15 LAB — D-DIMER, QUANTITATIVE (NOT AT ARMC): D DIMER QUANT: 1.89 ug{FEU}/mL — AB (ref 0.00–0.50)

## 2015-12-15 LAB — BRAIN NATRIURETIC PEPTIDE: B NATRIURETIC PEPTIDE 5: 56 pg/mL (ref 0.0–100.0)

## 2015-12-15 MED ORDER — ASPIRIN 81 MG PO TBEC: 81.0000 mg | DELAYED_RELEASE_TABLET | Freq: Every morning | ORAL | Status: DC

## 2015-12-15 MED ORDER — ASPIRIN EC 81 MG PO TBEC
81.0000 mg | DELAYED_RELEASE_TABLET | Freq: Every day | ORAL | Status: DC
  Administered 2015-12-16: 81 mg via ORAL
  Filled 2015-12-15: qty 1

## 2015-12-15 MED ORDER — LORATADINE 10 MG PO TABS
10.0000 mg | ORAL_TABLET | Freq: Every morning | ORAL | Status: DC
  Administered 2015-12-16 – 2015-12-18 (×3): 10 mg via ORAL
  Filled 2015-12-15 (×3): qty 1

## 2015-12-15 MED ORDER — SODIUM CHLORIDE 0.9% FLUSH: 3.0000 mL | Freq: Two times a day (BID) | INTRAVENOUS | Status: DC

## 2015-12-15 MED ORDER — SODIUM CHLORIDE 0.9% FLUSH: 3.0000 mL | INTRAVENOUS | Status: DC | PRN

## 2015-12-15 MED ORDER — POTASSIUM CHLORIDE CRYS ER 20 MEQ PO TBCR
20.0000 meq | EXTENDED_RELEASE_TABLET | Freq: Every day | ORAL | Status: DC
  Administered 2015-12-16 – 2015-12-18 (×3): 20 meq via ORAL
  Filled 2015-12-15 (×3): qty 1

## 2015-12-15 MED ORDER — SODIUM CHLORIDE 0.9 % IV BOLUS (SEPSIS)
1000.0000 mL | Freq: Once | INTRAVENOUS | Status: AC
  Administered 2015-12-15: 1000 mL via INTRAVENOUS

## 2015-12-15 MED ORDER — OXYCODONE HCL 10 MG PO TABS: 10.0000 mg | ORAL_TABLET | Freq: Every day | ORAL | Status: DC | PRN

## 2015-12-15 MED ORDER — DOCUSATE SODIUM 100 MG PO CAPS: 100.0000 mg | ORAL_CAPSULE | Freq: Every day | ORAL | Status: DC | PRN

## 2015-12-15 MED ORDER — ALPRAZOLAM 0.25 MG PO TABS
0.2500 mg | ORAL_TABLET | Freq: Two times a day (BID) | ORAL | Status: DC | PRN
  Administered 2015-12-15: 0.25 mg via ORAL
  Filled 2015-12-15: qty 1

## 2015-12-15 MED ORDER — ENOXAPARIN SODIUM 30 MG/0.3ML ~~LOC~~ SOLN
30.0000 mg | Freq: Every day | SUBCUTANEOUS | Status: DC
  Administered 2015-12-15: 30 mg via SUBCUTANEOUS
  Filled 2015-12-15: qty 0.3

## 2015-12-15 MED ORDER — ISOSORBIDE MONONITRATE ER 60 MG PO TB24
120.0000 mg | ORAL_TABLET | Freq: Every morning | ORAL | Status: DC
  Administered 2015-12-16 – 2015-12-18 (×3): 120 mg via ORAL
  Filled 2015-12-15 (×3): qty 2

## 2015-12-15 MED ORDER — IOPAMIDOL (ISOVUE-370) INJECTION 76%
80.0000 mL | Freq: Once | INTRAVENOUS | Status: AC | PRN
  Administered 2015-12-15: 80 mL via INTRAVENOUS

## 2015-12-15 MED ORDER — MELATONIN 3 MG PO CAPS: 1.0000 | ORAL_CAPSULE | Freq: Every day | ORAL | Status: DC

## 2015-12-15 MED ORDER — ACETAMINOPHEN 325 MG PO TABS
650.0000 mg | ORAL_TABLET | Freq: Four times a day (QID) | ORAL | Status: DC | PRN
  Administered 2015-12-16: 650 mg via ORAL
  Filled 2015-12-15: qty 2

## 2015-12-15 MED ORDER — DOXYCYCLINE HYCLATE 100 MG IV SOLR
100.0000 mg | Freq: Two times a day (BID) | INTRAVENOUS | Status: DC
  Administered 2015-12-15 – 2015-12-16 (×3): 100 mg via INTRAVENOUS
  Filled 2015-12-15 (×7): qty 100

## 2015-12-15 MED ORDER — PANTOPRAZOLE SODIUM 40 MG PO TBEC
40.0000 mg | DELAYED_RELEASE_TABLET | Freq: Every morning | ORAL | Status: DC
  Administered 2015-12-16 – 2015-12-18 (×3): 40 mg via ORAL
  Filled 2015-12-15 (×3): qty 1

## 2015-12-15 MED ORDER — FUROSEMIDE 20 MG PO TABS
20.0000 mg | ORAL_TABLET | Freq: Every day | ORAL | Status: DC
  Administered 2015-12-16 – 2015-12-18 (×3): 20 mg via ORAL
  Filled 2015-12-15 (×3): qty 1

## 2015-12-15 MED ORDER — CARVEDILOL 12.5 MG PO TABS
12.5000 mg | ORAL_TABLET | Freq: Two times a day (BID) | ORAL | Status: DC
  Administered 2015-12-16 – 2015-12-18 (×5): 12.5 mg via ORAL
  Filled 2015-12-15 (×5): qty 1

## 2015-12-15 MED ORDER — DULOXETINE HCL 60 MG PO CPEP
60.0000 mg | ORAL_CAPSULE | Freq: Every day | ORAL | Status: DC
  Administered 2015-12-15 – 2015-12-17 (×3): 60 mg via ORAL
  Filled 2015-12-15 (×3): qty 1

## 2015-12-15 MED ORDER — NITROGLYCERIN 0.4 MG SL SUBL: 0.4000 mg | SUBLINGUAL_TABLET | SUBLINGUAL | Status: DC | PRN

## 2015-12-15 MED ORDER — OXYCODONE HCL 5 MG PO TABS
10.0000 mg | ORAL_TABLET | Freq: Every day | ORAL | Status: DC | PRN
  Administered 2015-12-15 – 2015-12-17 (×3): 10 mg via ORAL
  Filled 2015-12-15 (×3): qty 2

## 2015-12-15 MED ORDER — SODIUM CHLORIDE 0.9 % IV SOLN: 250.0000 mL | INTRAVENOUS | Status: DC | PRN

## 2015-12-15 MED ORDER — DOXYCYCLINE HYCLATE 100 MG IV SOLR
INTRAVENOUS | Status: AC
  Filled 2015-12-15: qty 100

## 2015-12-15 MED ORDER — SODIUM CHLORIDE 0.9% FLUSH
3.0000 mL | Freq: Two times a day (BID) | INTRAVENOUS | Status: DC
  Administered 2015-12-16: 3 mL via INTRAVENOUS

## 2015-12-15 MED ORDER — GABAPENTIN 300 MG PO CAPS
300.0000 mg | ORAL_CAPSULE | Freq: Three times a day (TID) | ORAL | Status: DC
  Administered 2015-12-15 – 2015-12-18 (×7): 300 mg via ORAL
  Filled 2015-12-15 (×8): qty 1

## 2015-12-15 MED ORDER — FLUTICASONE PROPIONATE 50 MCG/ACT NA SUSP
2.0000 | Freq: Every day | NASAL | Status: DC
  Administered 2015-12-16 – 2015-12-18 (×2): 2 via NASAL
  Filled 2015-12-15 (×2): qty 16

## 2015-12-15 MED ORDER — ACETAMINOPHEN 650 MG RE SUPP: 650.0000 mg | Freq: Four times a day (QID) | RECTAL | Status: DC | PRN

## 2015-12-15 MED ORDER — DICLOFENAC SODIUM 1 % TD GEL
4.0000 g | Freq: Every day | TRANSDERMAL | Status: DC | PRN
  Filled 2015-12-15: qty 100

## 2015-12-15 NOTE — H&P (Addendum)
TRH H&P   Patient Demographics:    [name], is a 82 y.o. female  MRN: [id number]   DOB - 08/05/1932  Admit Date - 12/15/2015  Outpatient Primary MD for the patient is Tawni Carnes, PA-C  Referring MD/NP/PA: Dr. Myrene Buddy   Outpatient Specialists: Arnoldo Hooker (cardiologist)  Patient coming from: home  Chief Complaint  Patient presents with  . Cough  . Chest Pain      HPI:    [name]  is a 82 y.o. female, with CAD, Pafib (Chads2vasc= 6), CHF (EF 60%), apparently presents with c/o chest pain.  Under the left breast.  Radiation towards the left shoulder.  "similar to prior cardiac chest pain" per pt.  " sharp"   + cough,  Dry.  Has been having chest pain for the past 2 days.  + dyspnea (stable), Denies fever, chills, n/v, heartburn, diarrhea, brbpr, black stool.  The patient was having physical therapy, who called RN and pt was told to come to ER for evaluation.    In ED,  EKG:  NSR at 73, LAD, poor R progression, slight st depression in V4-6.  Pt is presently cp free.  Pt will be admitted for CP    Review of systems:    In addition to the HPI above,  No Fever-chills, No Headache, No changes with Vision or hearing, No problems swallowing food or Liquids, No Abdominal pain, No Nausea or Vommitting, Bowel movements are regular, No Blood in stool or Urine, No dysuria, No new skin rashes or bruises, No new joints pains-aches,  No new weakness, tingling, numbness in any extremity, No recent weight gain or loss, No polyuria, polydypsia or polyphagia, No significant Mental Stressors.  A full 10 point Review of Systems was done, except as stated above, all other Review of Systems were negative.   With Past History of the following :    Past Medical History:  Diagnosis Date  . Anxiety   . Arthritis   . Bursitis    left shoulder  . Cataract   . CHF  (congestive heart failure) (HCC)    EF 55-60%  . Complication of anesthesia   . COPD (chronic obstructive pulmonary disease) ([place])   . Coronary atherosclerosis of native coronary artery    a. DES to CX, moderately severe stenosis RCA, mild stenosis LAD 04/2013  . Depression   . Dysphagia, unspecified(787.20)   . GERD (gastroesophageal reflux disease)    Hx Schatzki's ring, multiple EGD/ED last 01/06/2004  . Headache   . Heart disease   . Heart murmur    'a littel'  . HTN (hypertension)    Hx of it  . Hyperlipemia   . Hyperlipidemia   . Internal hemorrhoids without mention of complication   . MI (myocardial infarction) ([place]) 2006  . Microscopic colitis 2003  . Other and unspecified hyperlipidemia   . Panic disorder  without agoraphobia   . Paresthesia    hands, feet  . Pneumonia 12/2011  . PONV (postoperative nausea and vomiting)    'a little nausea"  . PVD (peripheral vascular disease) ([place])   . S/P colonoscopy 09/27/2001   internal hemorrhoids, desc colon inflam polyp, SB BX-chronic duodenitis, colitis  . Shortness of breath   . Sleep disorder    obstructive  . Thyroid disease    recent abnl TSH per pt      Past Surgical History:  Procedure Laterality Date  . ABDOMINAL HYSTERECTOMY    . ANTERIOR AND POSTERIOR REPAIR     with resection of vagina  . APPENDECTOMY    . BACK SURGERY    . BIOPSY  07/05/2015   Procedure: BIOPSY;  Surgeon: Daneil Dolin, MD;  [facility];  Service: Endoscopy;;  gastric polyp biopsy, ascending colon biopsy  . BLADDER SUSPENSION  11/09/2011   Procedure: TRANSVAGINAL TAPE (TVT) PROCEDURE;  Surgeon: Marissa Nestle, MD;  Location: [facility] OR[facility];  Service: Urology;  Laterality: N/A;  . bladder tack  06/2010  . BREAST LUMPECTOMY  1998   left, benign  . CARDIAC CATHETERIZATION    . CARDIAC CATHETERIZATION    . [place]   left  . CHOLECYSTECTOMY  1998  . COLONOSCOPY  03/16/2011   multiple hyperplastic colon polyps,  sigmoid diverticulosis, melanosis coli  . COLONOSCOPY WITH PROPOFOL N/A 07/05/2015   RMR:one 5 mm polyp in descending colon  . CORONARY ANGIOPLASTY WITH STENT PLACEMENT    . ESOPHAGEAL DILATION N/A 07/05/2015   Procedure: ESOPHAGEAL DILATION;  Surgeon: Daneil Dolin, MD;  [facility]: [facility];  Service: Endoscopy;  Laterality: N/A;  . ESOPHAGOGASTRODUODENOSCOPY (EGD) WITH PROPOFOL N/A 07/05/2015   [id number]  . JOINT REPLACEMENT Right 2007  . left hand surgery    . LEFT HEART CATHETERIZATION WITH CORONARY ANGIOGRAM N/A 05/14/2013   Procedure: LEFT HEART CATHETERIZATION WITH CORONARY ANGIOGRAM;  Surgeon: Blane Ohara, MD;  Location: [facility] CATH LAB;  Service: Cardiovascular;  Laterality: N/A;  . left rotator cuff surgery    . LUMBAR LAMINECTOMY/DECOMPRESSION MICRODISCECTOMY N/A 10/11/2012   Procedure: LUMBAR LAMINECTOMY/DECOMPRESSION MICRODISCECTOMY 2 LEVELS;  Surgeon: Floyce Stakes, MD;  Location: [facility]URO ORS;  Service: Neurosurgery;  Laterality: N/A;  L3-4 L4-5 Laminectomy  . LUMBAR WOUND DEBRIDEMENT N/A 09/27/2015   Procedure: Exploration of Lumbar Wound w/ Repair CSF Leak/Lumbar Drain Placement;  Surgeon: Leeroy Cha, MD;  Location: [facility]URO ORS;  Service: Neurosurgery;  Laterality: N/A;  . MALONEY DILATION  03/16/2011   Gastritis. No H.pylori on bx. 41F maloney dilation with disruption of  occult cevical esophageal web  . NASAL SINUS SURGERY    . right knee replacement  2007  . right leg benign tumor    . SHOULDER SURGERY Left   . TONSILLECTOMY    . unspecified area, hysterectomy  1972   partial      Social History:     Social History  Substance Use Topics  . Smoking status: Former Smoker    Packs/day: 1.00    Years: 64.00    Types: Cigarettes    Start date: 12/24/1947    Quit date: 11/17/2001  . Smokeless tobacco: Never Used     Comment: Quit smoking in 2003  . Alcohol use No     Lives - at home with daughter " Miyeko Caufield"  Mobility -  Walks with walker    Family History :  Family History  Problem Relation Age of Onset  . Stroke Mother   . Parkinson's disease Father   . Coronary artery disease Other     family Hx-sons  . Cancer Other   . Stroke Other     family Hx  . Hypertension Other     family Hx  . Diabetes Brother   . Heart disease Son     before age 36  . Diabetes Son   . Stroke Daughter 83      Home Medications:   Prior to Admission medications   Medication Sig Start Date End Date Taking? Authorizing Provider  acetaminophen (TYLENOL) 500 MG tablet Take 500 mg by mouth daily as needed for headache.    Yes Historical Provider, MD  ALPRAZolam (XANAX) 0.25 MG tablet Take 0.25 mg by mouth 2 (two) times daily as needed for anxiety.    Yes Historical Provider, MD  aspirin 81 MG EC tablet Take 81 mg by mouth every morning.    Yes Historical Provider, MD  carvedilol (COREG) 25 MG tablet Take 12.5 mg by mouth 2 (two) times daily with a meal.    Yes Historical Provider, MD  diclofenac sodium (VOLTAREN) 1 % GEL Apply 4 g topically daily as needed (for back pain).    Yes Historical Provider, MD  docusate sodium (COLACE) 100 MG capsule Take 100 mg by mouth daily as needed for mild constipation.   Yes Historical Provider, MD  DULoxetine (CYMBALTA) 60 MG capsule Take 60 mg by mouth at bedtime.    Yes Historical Provider, MD  fluticasone (FLONASE) 50 MCG/ACT nasal spray Place 2 sprays into both nostrils daily.    Yes Historical Provider, MD  furosemide (LASIX) 20 MG tablet Take 1 tablet (20 mg total) by mouth daily. 05/25/15  Yes Lendon Colonel, NP  gabapentin (NEURONTIN) 300 MG capsule Take 1 capsule (300 mg total) by mouth 3 (three) times daily. 10/08/15  Yes Kevan Ny Ditty, MD  isosorbide mononitrate (IMDUR) 120 MG 24 hr tablet Take 120 mg by mouth every morning.   Yes Historical Provider, MD  loratadine (CLARITIN) 10 MG tablet Take 10 mg by mouth every morning.   Yes Historical Provider, MD  Melatonin 3 MG CAPS Take 1 capsule  by mouth at bedtime.   Yes Historical Provider, MD  nitroGLYCERIN (NITROSTAT) 0.4 MG SL tablet Place 0.4 mg under the tongue every 5 (five) minutes as needed for chest pain. Reported on 08/04/2015   Yes Historical Provider, MD  Oxycodone HCl 10 MG TABS Take 10 mg by mouth daily as needed for pain.    Yes Historical Provider, MD  pantoprazole (PROTONIX) 40 MG tablet Take 40 mg by mouth every morning.    Yes Historical Provider, MD  potassium chloride (K-DUR,KLOR-CON) 10 MEQ tablet TAKE TWO (2) TABLETS BY MOUTH DAILY. 04/30/15  Yes Herminio Commons, MD  oxyCODONE-acetaminophen (PERCOCET/ROXICET) 5-325 MG tablet Take 1-2 tablets by mouth every 4 (four) hours as needed for moderate pain. Max APAP 3gm/24hours from all sources Patient not taking: Reported on 12/15/2015 10/13/15   Estill Dooms, MD  sulfamethoxazole-trimethoprim (BACTRIM DS,SEPTRA DS) 800-160 MG tablet Take 1 tablet by mouth 2 (two) times daily. Course completed    Historical Provider, MD     Allergies:     Allergies  Allergen Reactions  . Cephalosporins Diarrhea and Nausea Only    Lightheaded  . Levaquin [Levofloxacin In D5w] Swelling  . Macrodantin [Nitrofurantoin Macrocrystal] Swelling  . Phenothiazines Anaphylaxis and Hives  .  Polysorbate Anaphylaxis  . Prednisone Shortness Of Breath  . Buspirone Itching  . Cardura [Doxazosin Mesylate] Itching  . Codeine Itching  . Acyclovir And Related   . Prochlorperazine Other (See Comments)    unknown  . Ranexa [Ranolazine]     Severe drop in BP  . Atorvastatin Hives    Cramping; tolerates Crestor ok  . Ofloxacin Rash  . Other Itching and Rash    "WOOL"= make skin look like it has been burned  . Penicillins Other (See Comments)    Causes redness all over. Has patient had a PCN reaction causing immediate rash, facial/tongue/throat swelling, SOB or lightheadedness with hypotension: No Has patient had a PCN reaction causing severe rash involving mucus membranes or skin necrosis:  No Has patient had a PCN reaction that required hospitalization No Has patient had a PCN reaction occurring within the last 10 years: No If all of the above answers are "NO", then may proceed with Cephalosporin use.   . Pimozide Hives and Itching     Physical Exam:   Vitals  Blood pressure 154/62, pulse 70, temperature 98.1 F (36.7 C), temperature source Oral, resp. rate (!) 28, height 5\' 1"  (1.549 m), weight 78.9 kg (174 lb), SpO2 95 %.   1. General lying in bed in NAD,    2. Normal affect and insight, Not Suicidal or Homicidal, Awake Alert, Oriented X 3.  3. No F.N deficits, ALL C.Nerves Intact, Strength 5/5 all 4 extremities, Sensation intact all 4 extremities, Plantars down going.  4. Ears and Eyes appear Normal, Conjunctivae clear, PERRLA. Moist Oral Mucosa.  5. Supple Neck, No JVD, No cervical lymphadenopathy appriciated, No Carotid Bruits.  6. Symmetrical Chest wall movement, Good air movement bilaterally, CTAB. Slight soreness in chest with palpation,    7. RRR, No Gallops, Rubs or Murmurs, No Parasternal Heave.  8. Positive Bowel Sounds, Abdomen Soft, No tenderness, No organomegaly appriciated,No rebound -guarding or rigidity.  9.  No Cyanosis, Normal Skin Turgor, No Skin Rash or Bruise.  10. Good muscle tone,  joints appear normal , no effusions, Normal ROM.  11. No Palpable Lymph Nodes in Neck or Axillae     Data Review:    CBC  Recent Labs Lab 12/15/15 1608  WBC 7.1  HGB 10.7*  HCT 32.5*  PLT 327  MCV 83.5  MCH 27.5  MCHC 32.9  RDW 13.9  LYMPHSABS 2.6  MONOABS 0.4  EOSABS 0.4  BASOSABS 0.1   ------------------------------------------------------------------------------------------------------------------  Chemistries   Recent Labs Lab 12/15/15 1608  NA 133*  K 3.5  CL 99*  CO2 24  GLUCOSE 149*  BUN 20  CREATININE 1.45*  CALCIUM 9.3    ------------------------------------------------------------------------------------------------------------------ estimated creatinine clearance is 28.9 mL/min (by C-G formula based on SCr of 1.45 mg/dL). ------------------------------------------------------------------------------------------------------------------ No results for input(s): TSH, T4TOTAL, T3FREE, THYROIDAB in the last 72 hours.  Invalid input(s): FREET3  Coagulation profile No results for input(s): INR, PROTIME in the last 168 hours. -------------------------------------------------------------------------------------------------------------------  Recent Labs  12/15/15 1608  DDIMER 1.89*   -------------------------------------------------------------------------------------------------------------------  Cardiac Enzymes  Recent Labs Lab 12/15/15 1608  TROPONINI <0.03   ------------------------------------------------------------------------------------------------------------------    Component Value Date/Time   BNP 56.0 12/15/2015 1608     ---------------------------------------------------------------------------------------------------------------  Urinalysis    Component Value Date/Time   COLORURINE YELLOW 10/27/2015 1345   APPEARANCEUR HAZY (A) 10/27/2015 1345   LABSPEC 1.010 10/27/2015 1345   PHURINE 5.5 10/27/2015 1345   GLUCOSEU NEGATIVE 10/27/2015 1345   HGBUR TRACE (A) 10/27/2015 1345  BILIRUBINUR NEGATIVE 10/27/2015 1345   KETONESUR NEGATIVE 10/27/2015 1345   PROTEINUR NEGATIVE 10/27/2015 1345   UROBILINOGEN 0.2 02/21/2015 2028   NITRITE NEGATIVE 10/27/2015 1345   LEUKOCYTESUR MODERATE (A) 10/27/2015 1345    ----------------------------------------------------------------------------------------------------------------   Imaging Results:    Dg Chest 2 View  Result Date: 12/15/2015 CLINICAL DATA:  Nonproductive cough since last night associated with sharp anterior chest  discomfort today, no pleuritic component EXAM: CHEST  2 VIEW COMPARISON:  PA and lateral chest x-ray and chest CT scan of October 15, 2015 FINDINGS: The lungs are adequately inflated. The interstitial markings are coarse but stable. The heart is normal in size. The pulmonary vascularity is not engorged. There is calcification in the wall of the aortic arch. There is no pleural effusion. There are old left lateral rib deformities. IMPRESSION: Chronic bronchitic changes. No alveolar pneumonia nor CHF nor other acute cardiopulmonary abnormality. Aortic atherosclerosis. Electronically Signed   By: David  Martinique M.D.   On: 12/15/2015 16:15   Ct Angio Chest Pe W And/or Wo Contrast  Result Date: 12/15/2015 CLINICAL DATA:  Nonproductive cough since last night. Pain under the breast. EXAM: CT ANGIOGRAPHY CHEST WITH CONTRAST TECHNIQUE: Multidetector CT imaging of the chest was performed using the standard protocol during bolus administration of intravenous contrast. Multiplanar CT image reconstructions and MIPs were obtained to evaluate the vascular anatomy. CONTRAST:  100 mL Isovue 370 COMPARISON:  10/15/2015 FINDINGS: Mediastinum/Lymph Nodes: No pulmonary emboli or thoracic aortic dissection identified. Thoracic aortic atherosclerosis. Normal size heart size. Coronary artery atherosclerosis in the LAD, circumflex, left main and RCA. Enlarged subcarinal lymph node measuring 11 mm in short axis. Lungs/Pleura: No focal consolidation, pleural effusion or pneumothorax. Mild reticular nodular interstitial disease in the right upper lobe. Bilateral patchy areas of ground-glass opacity. Upper abdomen: No acute findings. Musculoskeletal: No chest wall mass or suspicious bone lesions identified. Review of the MIP images confirms the above findings. IMPRESSION: 1. No pulmonary embolus. 2. No thoracic aortic dissection. 3. Mild reticular nodular interstitial disease in the right upper lobe which may be secondary to an infectious or  inflammatory etiology. Electronically Signed   By: Kathreen Devoid   On: 12/15/2015 20:01      Assessment & Plan:    Principal Problem:   Chest pain Active Problems:   Hyponatremia   Hyperglycemia    1.  Cp Tele Trop I q6h x3 Lipid in am Check cardiac echo Cardiology consultation Defer to cardiology regarding cardiac cath vs stress testing.  (apparently pt had recent stress test)  2.  Hyponatremia Check cmp in am  3.  Hyperglycemia Check hga1c  4. ARF Creatinine probably elevated due to recent Bactrim use.  Check cmp in am  5.  CHF (EF 55-605) Cont lasix, cont carvedilol  6. CAD (nuclear stress test 10/07/2015=> low risk) Cont aspirin, cont carvedilol, cont imdur.  Pt states statin intolerant Await lipid panel Consider zetia or newer agent psk9 inhibitor  7. Gerd Cont protonix  8. Cough CTA chest 8/30=> negative for PE,  Mild reticular nodular interstitial disease in the right upper lobe ? Infectious or inflammatory Doxycycline 100mg  iv bid  DVT Prophylaxis   Lovenox - SCDs   AM Labs Ordered, also please review Full Orders  Family Communication: Admission, patients condition and plan of care including tests being ordered have been discussed with the patient  who indicate understanding and agree with the plan and Code Status.  Code Status FULL CODE  Likely DC to  Condition GUARDED    Consults called:   Admission status:  observation  Time spent in minutes : 45 minutes   Jani Gravel M.D on 12/15/2015 at 9:02 PM  Between 7am to 7pm - Pager - [phone] After 7pm go to [email or web address] - password [email or web address]  [facility] - Office  [phone]

## 2015-12-15 NOTE — ED Triage Notes (Signed)
Pt reports nonproductive cough since last night.  Reports started having sharp pain in chest and under breasts today.  Pt says pain comes and goes and coughing doesn't make the pain any worse.  Pt says she had some numbness in left leg last night.  She said she "worked it back " but toes are still numb.

## 2015-12-15 NOTE — ED Provider Notes (Signed)
[facility] DEPT Provider Note   CSN: [id number] Arrival date & time: 12/15/15  1523     History   Chief Complaint Chief Complaint  Patient presents with  . Cough  . Chest Pain    HPI [name] is a 82 y.o. female.  HPI   82 year old female with extensive past medical history including coronary artery disease who presents with a one-month history of persistent, dry cough and increasing frequency of chest pain. The patient states that for the last month she has had a dry, hacking, nonproductive cough. She has associated shortness of breath with exertion. Over the last week she has had increasing frequency of sharp left-sided chest pain. The pain is worse with exertion as well as coughing. Denies any alleviating factors. The pain feels to her previous ACS. She has seen her doctor for these symptoms with repeatedly negative exams, but due to the increasing chest pain, she presents for evaluation.  Past Medical History:  Diagnosis Date  . Anxiety   . Arthritis   . Bursitis    left shoulder  . Cataract   . CHF (congestive heart failure) ([place])   . Complication of anesthesia   . COPD (chronic obstructive pulmonary disease) ([place])   . Coronary atherosclerosis of native coronary artery    a. DES to CX, moderately severe stenosis RCA, mild stenosis LAD 04/2013  . Depression   . Dysphagia, unspecified(787.20)   . GERD (gastroesophageal reflux disease)    Hx Schatzki's ring, multiple EGD/ED last 01/06/2004  . Headache   . Heart disease   . Heart murmur    'a littel'  . HTN (hypertension)    Hx of it  . Hyperlipemia   . Hyperlipidemia   . Internal hemorrhoids without mention of complication   . MI (myocardial infarction) ([place]) 2006  . Microscopic colitis 2003  . Other and unspecified hyperlipidemia   . Panic disorder without agoraphobia   . Paresthesia    hands, feet  . Pneumonia 12/2011  . PONV (postoperative nausea and vomiting)    'a little nausea"  . PVD  (peripheral vascular disease) ([place])   . S/P colonoscopy 09/27/2001   internal hemorrhoids, desc colon inflam polyp, SB BX-chronic duodenitis, colitis  . Shortness of breath   . Sleep disorder    obstructive  . Thyroid disease    recent abnl TSH per pt    Patient Active Problem List   Diagnosis Date Noted  . Chest pain at rest 10/15/2015  . Chronic low back pain 10/15/2015  . Hyponatremia 10/15/2015  . Hyperglycemia 10/15/2015  . Thrombocytosis ([place]) 10/15/2015  . Chest pain 10/15/2015  . Depression   . Anxiety   . Gastroesophageal reflux disease without esophagitis   . Mild cognitive impairment 10/14/2015  . Iron deficiency anemia due to chronic blood loss   . Meningitis 10/01/2015  . Coronary artery disease due to lipid rich plaque   . NSVT (nonsustained ventricular tachycardia) ([place])   . PAF (paroxysmal atrial fibrillation) ([place])   . CSF leak 09/27/2015  . Spondylolisthesis of lumbar region 09/24/2015  . Gastric polyp   . History of colonic polyps   . Chronic diarrhea   . Diarrhea 04/01/2015  . Esophageal dysphagia 04/01/2015  . PAOD (peripheral arterial occlusive disease) ([place]) 03/05/2015  . Pain in the chest   . Hyperlipidemia   . Weight gain 08/12/2014  . Normocytic anemia 08/07/2014  . Hemorrhoids 08/06/2014  . Diastolic CHF, acute on chronic (HCC) 07/30/2014  . CHF (  congestive heart failure) ([place]) 07/29/2014  . Rectal bleeding 07/06/2014  . Constipation 07/06/2014  . Lower extremity edema 11/24/2013  . Acute kidney failure ([place]) 11/24/2013  . Hypokalemia 11/24/2013  . Other and unspecified angina pectoris 05/14/2013  . Hematochezia 02/13/2011  . Major depression ([place]) 09/28/2010  . FATIGUE 04/13/2009  . Chronic diastolic heart failure ([place]) 11/30/2008  . DYSPNEA 11/30/2008  . HYPERLIPIDEMIA-MIXED 11/27/2008  . CAD, NATIVE VESSEL - PCI + DES to left circumflex 05/14/13 11/27/2008  . Peripheral vascular disease ([place]) 11/27/2008  . PANIC ATTACK 02/28/2008  .  MI 02/28/2008  . Internal hemorrhoids 02/28/2008  . GASTROESOPHAGEAL REFLUX DISEASE, CHRONIC 02/28/2008  . COLITIS 02/28/2008  . Dysphagia 02/28/2008    Past Surgical History:  Procedure Laterality Date  . ABDOMINAL HYSTERECTOMY    . ANTERIOR AND POSTERIOR REPAIR     with resection of vagina  . APPENDECTOMY    . BACK SURGERY    . BIOPSY  07/05/2015   Procedure: BIOPSY;  Surgeon: Daneil Dolin, MD;  Location: [facility] END[facility] SUITE;  Service: Endoscopy;;  gastric polyp biopsy, ascending colon biopsy  . BLADDER SUSPENSION  11/09/2011   Procedure: TRANSVAGINAL TAPE (TVT) PROCEDURE;  Surgeon: Marissa Nestle, MD;  Location: [facility] ORS;  Service: Urology;  Laterality: N/A;  . bladder tack  06/2010  . BREAST LUMPECTOMY  1998   left, benign  . CARDIAC CATHETERIZATION    . CARDIAC CATHETERIZATION    . [place]   left  . CHOLECYSTECTOMY  1998  . COLONOSCOPY  03/16/2011   multiple hyperplastic colon polyps, sigmoid diverticulosis, melanosis coli  . COLONOSCOPY WITH PROPOFOL N/A 07/05/2015   RMR:one 5 mm polyp in descending colon  . CORONARY ANGIOPLASTY WITH STENT PLACEMENT    . ESOPHAGEAL DILATION N/A 07/05/2015   Procedure: ESOPHAGEAL DILATION;  Surgeon: Daneil Dolin, MD;  Location: [facility];  Service: Endoscopy;  Laterality: N/A;  . ESOPHAGOGASTRODUODENOSCOPY (EGD) WITH PROPOFOL N/A 07/05/2015   [id number]  . JOINT REPLACEMENT Right 2007  . left hand surgery    . LEFT HEART CATHETERIZATION WITH CORONARY ANGIOGRAM N/A 05/14/2013   Procedure: LEFT HEART CATHETERIZATION WITH CORONARY ANGIOGRAM;  Surgeon: Blane Ohara, MD;  Location[facility] LAB;  Service: Cardiovascular;  Laterality: N/A;  . left rotator cuff surgery    . LUMBAR LAMINECTOMY/DECOMPRESSION MICRODISCECTOMY N/A 10/11/2012   Procedure: LUMBAR LAMINECTOMY/DECOMPRESSION MICRODISCECTOMY 2 LEVELS;  Surgeon: Floyce Stakes, MD;  Location: [facility] NEURO ORS;  Service: Neurosurgery;  Laterality: N/A;  L3-4 L4-5 Laminectomy   . LUMBAR WOUND DEBRIDEMENT N/A 09/27/2015   Procedure: Exploration of Lumbar Wound w/ Repair CSF Leak/Lumbar Drain Placement;  Surgeon: Leeroy Cha, MD;  Location: [facility] NEURO ORS;  Service: Neurosurgery;  Laterality: N/A;  . MALONEY DILATION  03/16/2011   Gastritis. No H.pylori on bx. 96F maloney dilation with disruption of  occult cevical esophageal web  . NASAL SINUS SURGERY    . right knee replacement  2007  . right leg benign tumor    . SHOULDER SURGERY Left   . TONSILLECTOMY    . unspecified area, hysterectomy  1972   partial    OB History    Gravida Para Term Preterm AB Living   8 6     2 5    SAB TAB Ectopic Multiple Live Births                   Home Medications    Prior to Admission medications  Medication Sig Start Date End Date Taking? Authorizing Provider  acetaminophen (TYLENOL) 500 MG tablet Take 500 mg by mouth daily as needed for headache.    Yes Historical Provider, MD  ALPRAZolam (XANAX) 0.25 MG tablet Take 0.25 mg by mouth 2 (two) times daily as needed for anxiety.    Yes Historical Provider, MD  aspirin 81 MG EC tablet Take 81 mg by mouth every morning.    Yes Historical Provider, MD  carvedilol (COREG) 25 MG tablet Take 12.5 mg by mouth 2 (two) times daily with a meal.    Yes Historical Provider, MD  diclofenac sodium (VOLTAREN) 1 % GEL Apply 4 g topically daily as needed (for back pain).    Yes Historical Provider, MD  docusate sodium (COLACE) 100 MG capsule Take 100 mg by mouth daily as needed for mild constipation.   Yes Historical Provider, MD  DULoxetine (CYMBALTA) 60 MG capsule Take 60 mg by mouth at bedtime.    Yes Historical Provider, MD  fluticasone (FLONASE) 50 MCG/ACT nasal spray Place 2 sprays into both nostrils daily.    Yes Historical Provider, MD  furosemide (LASIX) 20 MG tablet Take 1 tablet (20 mg total) by mouth daily. 05/25/15  Yes Lendon Colonel, NP  gabapentin (NEURONTIN) 300 MG capsule Take 1 capsule (300 mg total) by mouth 3 (three)  times daily. 10/08/15  Yes Kevan Ny Ditty, MD  isosorbide mononitrate (IMDUR) 120 MG 24 hr tablet Take 120 mg by mouth every morning.   Yes Historical Provider, MD  loratadine (CLARITIN) 10 MG tablet Take 10 mg by mouth every morning.   Yes Historical Provider, MD  Melatonin 3 MG CAPS Take 1 capsule by mouth at bedtime.   Yes Historical Provider, MD  nitroGLYCERIN (NITROSTAT) 0.4 MG SL tablet Place 0.4 mg under the tongue every 5 (five) minutes as needed for chest pain. Reported on 08/04/2015   Yes Historical Provider, MD  Oxycodone HCl 10 MG TABS Take 10 mg by mouth daily as needed for pain.    Yes Historical Provider, MD  pantoprazole (PROTONIX) 40 MG tablet Take 40 mg by mouth every morning.    Yes Historical Provider, MD  potassium chloride (K-DUR,KLOR-CON) 10 MEQ tablet TAKE TWO (2) TABLETS BY MOUTH DAILY. 04/30/15  Yes Herminio Commons, MD  oxyCODONE-acetaminophen (PERCOCET/ROXICET) 5-325 MG tablet Take 1-2 tablets by mouth every 4 (four) hours as needed for moderate pain. Max APAP 3gm/24hours from all sources Patient not taking: Reported on 12/15/2015 10/13/15   Estill Dooms, MD  sulfamethoxazole-trimethoprim (BACTRIM DS,SEPTRA DS) 800-160 MG tablet Take 1 tablet by mouth 2 (two) times daily. Course completed    Historical Provider, MD    Family History Family History  Problem Relation Age of Onset  . Stroke Mother   . Coronary artery disease Other     family Hx-sons  . Cancer Other   . Stroke Other     family Hx  . Hypertension Other     family Hx  . Diabetes Brother   . Heart disease Son     before age 33  . Diabetes Son   . Stroke Daughter 50    Social History Social History  Substance Use Topics  . Smoking status: Former Smoker    Packs/day: 1.00    Years: 64.00    Types: Cigarettes    Start date: 12/24/1947    Quit date: 11/17/2001  . Smokeless tobacco: Never Used     Comment: Quit smoking in 2003  .  Alcohol use No     Allergies   Cephalosporins; Levaquin  [levofloxacin in d5w]; Macrodantin [nitrofurantoin macrocrystal]; Phenothiazines; Polysorbate; Prednisone; Buspirone; Cardura [doxazosin mesylate]; Codeine; Acyclovir and related; Prochlorperazine; Ranexa [ranolazine]; Atorvastatin; Ofloxacin; Other; Penicillins; and Pimozide   Review of Systems Review of Systems  Constitutional: Positive for fatigue. Negative for chills and fever.  HENT: Negative for congestion, rhinorrhea and sore throat.   Eyes: Negative for visual disturbance.  Respiratory: Positive for cough, chest tightness and shortness of breath. Negative for wheezing.   Cardiovascular: Positive for chest pain. Negative for leg swelling.  Gastrointestinal: Negative for abdominal pain, diarrhea, nausea and vomiting.  Genitourinary: Negative for dysuria, flank pain, vaginal bleeding and vaginal discharge.  Musculoskeletal: Negative for neck pain and neck stiffness.  Skin: Negative for rash and wound.  Allergic/Immunologic: Negative for immunocompromised state.  Neurological: Negative for syncope, weakness and headaches.  Hematological: Does not bruise/bleed easily.  All other systems reviewed and are negative.    Physical Exam Updated Vital Signs BP (!) 140/45   Pulse 69   Temp 98.1 F (36.7 C) (Oral)   Resp 20   Ht 5\' 1"  (1.549 m)   Wt 174 lb (78.9 kg)   SpO2 97%   BMI 32.88 kg/m   Physical Exam  Constitutional: She is oriented to person, place, and time. She appears well-developed and well-nourished. No distress.  HENT:  Head: Normocephalic and atraumatic.  Eyes: Conjunctivae are normal.  Neck: Neck supple.  Cardiovascular: Normal rate, regular rhythm and normal heart sounds.  Exam reveals no friction rub.   No murmur heard. Pulmonary/Chest: Effort normal and breath sounds normal. No respiratory distress. She has no wheezes. She has no rales.  Abdominal: She exhibits no distension.  Musculoskeletal: She exhibits no edema.  Neurological: She is alert and oriented  to person, place, and time. She exhibits normal muscle tone.  Skin: Skin is warm. Capillary refill takes less than 2 seconds.  Psychiatric: She has a normal mood and affect.  Nursing note and vitals reviewed.    ED Treatments / Results  Labs (all labs ordered are listed, but only abnormal results are displayed) Labs Reviewed  CBC WITH DIFFERENTIAL/PLATELET - Abnormal; Notable for the following:       Result Value   Hemoglobin 10.7 (*)    HCT 32.5 (*)    All other components within normal limits  BASIC METABOLIC PANEL - Abnormal; Notable for the following:    Sodium 133 (*)    Chloride 99 (*)    Glucose, Bld 149 (*)    Creatinine, Ser 1.45 (*)    GFR calc non Af Amer 33 (*)    GFR calc Af Amer 38 (*)    All other components within normal limits  D-DIMER, QUANTITATIVE (NOT AT [facility]) - Abnormal; Notable for the following:    D-Dimer, Quant 1.89 (*)    All other components within normal limits  TROPONIN I  BRAIN NATRIURETIC PEPTIDE    EKG  EKG Interpretation  Date/Time:  Wednesday December 15 2015 15:45:53 EDT Ventricular Rate:  73 PR Interval:  144 QRS Duration: 72 QT Interval:  412 QTC Calculation: 453 R Axis:   -36 Text Interpretation:  Normal sinus rhythm Left axis deviation Poor R wave progression Non-specific ST-t changes Confirmed by Wilson Singer  MD, STEPHEN ([id number]) on 12/15/2015 3:50:48 PM       Radiology Dg Chest 2 View  Result Date: 12/15/2015 CLINICAL DATA:  Nonproductive cough since last night associated with sharp anterior chest  discomfort today, no pleuritic component EXAM: CHEST  2 VIEW COMPARISON:  PA and lateral chest x-ray and chest CT scan of October 15, 2015 FINDINGS: The lungs are adequately inflated. The interstitial markings are coarse but stable. The heart is normal in size. The pulmonary vascularity is not engorged. There is calcification in the wall of the aortic arch. There is no pleural effusion. There are old left lateral rib deformities. IMPRESSION:  Chronic bronchitic changes. No alveolar pneumonia nor CHF nor other acute cardiopulmonary abnormality. Aortic atherosclerosis. Electronically Signed   By: David  Martinique M.D.   On: 12/15/2015 16:15   Ct Angio Chest Pe W And/or Wo Contrast  Result Date: 12/15/2015 CLINICAL DATA:  Nonproductive cough since last night. Pain under the breast. EXAM: CT ANGIOGRAPHY CHEST WITH CONTRAST TECHNIQUE: Multidetector CT imaging of the chest was performed using the standard protocol during bolus administration of intravenous contrast. Multiplanar CT image reconstructions and MIPs were obtained to evaluate the vascular anatomy. CONTRAST:  100 mL Isovue 370 COMPARISON:  10/15/2015 FINDINGS: Mediastinum/Lymph Nodes: No pulmonary emboli or thoracic aortic dissection identified. Thoracic aortic atherosclerosis. Normal size heart size. Coronary artery atherosclerosis in the LAD, circumflex, left main and RCA. Enlarged subcarinal lymph node measuring 11 mm in short axis. Lungs/Pleura: No focal consolidation, pleural effusion or pneumothorax. Mild reticular nodular interstitial disease in the right upper lobe. Bilateral patchy areas of ground-glass opacity. Upper abdomen: No acute findings. Musculoskeletal: No chest wall mass or suspicious bone lesions identified. Review of the MIP images confirms the above findings. IMPRESSION: 1. No pulmonary embolus. 2. No thoracic aortic dissection. 3. Mild reticular nodular interstitial disease in the right upper lobe which may be secondary to an infectious or inflammatory etiology. Electronically Signed   By: Kathreen Devoid   On: 12/15/2015 20:01    Procedures Procedures (including critical care time)  Medications Ordered in ED Medications  doxycycline (VIBRAMYCIN) 100 mg in dextrose 5 % 250 mL IVPB (not administered)  sodium chloride 0.9 % bolus 1,000 mL (1,000 mLs Intravenous New Bag/Given 12/15/15 1843)  iopamidol (ISOVUE-370) 76 % injection 80 mL (80 mLs Intravenous Contrast Given  12/15/15 1915)     Initial Impression / Assessment and Plan / ED Course  I have reviewed the triage vital signs and the nursing notes.  Pertinent labs & imaging results that were available during my care of the patient were reviewed by me and considered in my medical decision making (see chart for details).  Clinical Course   82 yo F with extensive PMHx including significant PVD and CAD who p/w worsening cough and chest pain. See HPI above. On arrival, VSS and WNL. Exam is as above. Regarding her cough, initial DDx includes: occult PNA, atypical PNA, PE, lung CA, less likely CHF as pt not hypervolemic clinically. Regarding her CP, this may be MSK 2/2 her cough, pleurisy (PE, PNA), or possible ACS. She does report her sx are similar to prior ACS. No changes on EKG however. Will check broad labs, D-Dimer, re-assess.  Labs, imaging are as above. CBC shows no leukocytosis. Anemia is at baseline. BMP with mild AKI. D-Dimer positive. Will give gentle hydration, obtain CTA for further assessment. BNP normal. Trop negative.  CTA PE shows no PE but does show possible atypical infection. Will start on doxy. Given high risk CP with report of CP similar to previous episodes, will admit for further monitoring, trend trops. Pt in agreement. She did have a recent negative stress test so will hopefully just need trending  of trops.  Final Clinical Impressions(s) / ED Diagnoses   Final diagnoses:  Cough  Atypical chest pain  CAP (community acquired pneumonia)    New Prescriptions New Prescriptions   No medications on file     Duffy Bruce, MD 12/15/15 2325

## 2015-12-16 ENCOUNTER — Encounter (HOSPITAL_COMMUNITY)
Admission: EM | Disposition: A | Discharge status: Home Health | Payer: Self-pay | Source: Home / Self Care | Attending: Internal Medicine

## 2015-12-16 ENCOUNTER — Observation Stay (HOSPITAL_COMMUNITY): Payer: Medicare Other

## 2015-12-16 DIAGNOSIS — Z955 Presence of coronary angioplasty implant and graft: Secondary | ICD-10-CM | POA: Diagnosis not present

## 2015-12-16 DIAGNOSIS — R0789 Other chest pain: Secondary | ICD-10-CM | POA: Diagnosis not present

## 2015-12-16 DIAGNOSIS — F410 Panic disorder [episodic paroxysmal anxiety] without agoraphobia: Secondary | ICD-10-CM | POA: Diagnosis present

## 2015-12-16 DIAGNOSIS — Z8249 Family history of ischemic heart disease and other diseases of the circulatory system: Secondary | ICD-10-CM | POA: Diagnosis not present

## 2015-12-16 DIAGNOSIS — R079 Chest pain, unspecified: Secondary | ICD-10-CM | POA: Diagnosis not present

## 2015-12-16 DIAGNOSIS — I5032 Chronic diastolic (congestive) heart failure: Secondary | ICD-10-CM | POA: Diagnosis present

## 2015-12-16 DIAGNOSIS — F329 Major depressive disorder, single episode, unspecified: Secondary | ICD-10-CM | POA: Diagnosis present

## 2015-12-16 DIAGNOSIS — E871 Hypo-osmolality and hyponatremia: Secondary | ICD-10-CM | POA: Diagnosis not present

## 2015-12-16 DIAGNOSIS — Z96651 Presence of right artificial knee joint: Secondary | ICD-10-CM | POA: Diagnosis present

## 2015-12-16 DIAGNOSIS — I110 Hypertensive heart disease with heart failure: Secondary | ICD-10-CM | POA: Diagnosis not present

## 2015-12-16 DIAGNOSIS — Z79899 Other long term (current) drug therapy: Secondary | ICD-10-CM | POA: Diagnosis not present

## 2015-12-16 DIAGNOSIS — I2510 Atherosclerotic heart disease of native coronary artery without angina pectoris: Secondary | ICD-10-CM | POA: Diagnosis not present

## 2015-12-16 DIAGNOSIS — R739 Hyperglycemia, unspecified: Secondary | ICD-10-CM | POA: Diagnosis not present

## 2015-12-16 DIAGNOSIS — Z7982 Long term (current) use of aspirin: Secondary | ICD-10-CM | POA: Diagnosis not present

## 2015-12-16 DIAGNOSIS — Z881 Allergy status to other antibiotic agents status: Secondary | ICD-10-CM | POA: Diagnosis not present

## 2015-12-16 DIAGNOSIS — I209 Angina pectoris, unspecified: Secondary | ICD-10-CM

## 2015-12-16 DIAGNOSIS — K219 Gastro-esophageal reflux disease without esophagitis: Secondary | ICD-10-CM | POA: Diagnosis present

## 2015-12-16 DIAGNOSIS — J440 Chronic obstructive pulmonary disease with acute lower respiratory infection: Secondary | ICD-10-CM | POA: Diagnosis not present

## 2015-12-16 DIAGNOSIS — I739 Peripheral vascular disease, unspecified: Secondary | ICD-10-CM | POA: Diagnosis present

## 2015-12-16 DIAGNOSIS — F419 Anxiety disorder, unspecified: Secondary | ICD-10-CM | POA: Diagnosis present

## 2015-12-16 DIAGNOSIS — N179 Acute kidney failure, unspecified: Secondary | ICD-10-CM | POA: Diagnosis not present

## 2015-12-16 DIAGNOSIS — I252 Old myocardial infarction: Secondary | ICD-10-CM | POA: Diagnosis not present

## 2015-12-16 DIAGNOSIS — G3184 Mild cognitive impairment, so stated: Secondary | ICD-10-CM | POA: Diagnosis not present

## 2015-12-16 DIAGNOSIS — J209 Acute bronchitis, unspecified: Secondary | ICD-10-CM | POA: Diagnosis not present

## 2015-12-16 DIAGNOSIS — Z87891 Personal history of nicotine dependence: Secondary | ICD-10-CM | POA: Diagnosis not present

## 2015-12-16 DIAGNOSIS — Z981 Arthrodesis status: Secondary | ICD-10-CM | POA: Diagnosis not present

## 2015-12-16 DIAGNOSIS — E785 Hyperlipidemia, unspecified: Secondary | ICD-10-CM | POA: Diagnosis present

## 2015-12-16 HISTORY — PX: CARDIAC CATHETERIZATION: SHX172

## 2015-12-16 LAB — CBC
HCT: 30.7 % — ABNORMAL LOW (ref 36.0–46.0)
HCT: 29.1 % — ABNORMAL LOW (ref 36.0–46.0)
HEMOGLOBIN: 10 g/dL — AB (ref 12.0–15.0)
Hemoglobin: 9.3 g/dL — ABNORMAL LOW (ref 12.0–15.0)
MCH: 27.8 pg (ref 26.0–34.0)
MCH: 27.4 pg (ref 26.0–34.0)
MCHC: 32.6 g/dL (ref 30.0–36.0)
MCHC: 32 g/dL (ref 30.0–36.0)
MCV: 85.3 fL (ref 78.0–100.0)
MCV: 85.8 fL (ref 78.0–100.0)
PLATELETS: 264 10*3/uL (ref 150–400)
Platelets: 328 10*3/uL (ref 150–400)
RBC: 3.6 MIL/uL — AB (ref 3.87–5.11)
RBC: 3.39 MIL/uL — ABNORMAL LOW (ref 3.87–5.11)
RDW: 13.8 % (ref 11.5–15.5)
RDW: 14.1 % (ref 11.5–15.5)
WBC: 6.1 10*3/uL (ref 4.0–10.5)
WBC: 5.5 10*3/uL (ref 4.0–10.5)

## 2015-12-16 LAB — COMPREHENSIVE METABOLIC PANEL
ALT: 22 U/L (ref 14–54)
ANION GAP: 9 (ref 5–15)
AST: 23 U/L (ref 15–41)
Albumin: 3.7 g/dL (ref 3.5–5.0)
Alkaline Phosphatase: 84 U/L (ref 38–126)
BUN: 16 mg/dL (ref 6–20)
CHLORIDE: 104 mmol/L (ref 101–111)
CO2: 23 mmol/L (ref 22–32)
Calcium: 8.7 mg/dL — ABNORMAL LOW (ref 8.9–10.3)
Creatinine, Ser: 1.41 mg/dL — ABNORMAL HIGH (ref 0.44–1.00)
GFR calc non Af Amer: 34 mL/min — ABNORMAL LOW (ref >60)
GFR, EST AFRICAN AMERICAN: 39 mL/min — AB (ref >60)
Glucose, Bld: 110 mg/dL — ABNORMAL HIGH (ref 65–99)
POTASSIUM: 3.4 mmol/L — AB (ref 3.5–5.1)
SODIUM: 136 mmol/L (ref 135–145)
Total Bilirubin: 0.4 mg/dL (ref 0.3–1.2)
Total Protein: 6.9 g/dL (ref 6.5–8.1)

## 2015-12-16 LAB — CREATININE, SERUM
CREATININE: 1.45 mg/dL — AB (ref 0.44–1.00)
GFR calc Af Amer: 38 mL/min — ABNORMAL LOW (ref >60)
GFR calc non Af Amer: 33 mL/min — ABNORMAL LOW (ref >60)

## 2015-12-16 LAB — LIPID PANEL
CHOL/HDL RATIO: 5.1 ratio
Cholesterol: 177 mg/dL (ref 0–200)
HDL: 35 mg/dL — AB (ref >40)
LDL CALC: 103 mg/dL — AB (ref 0–99)
Triglycerides: 197 mg/dL — ABNORMAL HIGH (ref <150)
VLDL: 39 mg/dL (ref 0–40)

## 2015-12-16 LAB — PROTIME-INR
INR: 1.01
PROTHROMBIN TIME: 13.3 s (ref 11.4–15.2)

## 2015-12-16 LAB — TROPONIN I: Troponin I: <0.03 ng/mL (ref <0.03)

## 2015-12-16 SURGERY — LEFT HEART CATH AND CORONARY ANGIOGRAPHY
Anesthesia: LOCAL

## 2015-12-16 MED ORDER — HEPARIN (PORCINE) IN NACL 2-0.9 UNIT/ML-% IJ SOLN
INTRAMUSCULAR | Status: AC
  Filled 2015-12-16: qty 1000

## 2015-12-16 MED ORDER — FENTANYL CITRATE (PF) 100 MCG/2ML IJ SOLN
INTRAMUSCULAR | Status: AC
  Filled 2015-12-16: qty 2

## 2015-12-16 MED ORDER — SODIUM CHLORIDE 0.9% FLUSH: 3.0000 mL | INTRAVENOUS | Status: DC | PRN

## 2015-12-16 MED ORDER — SODIUM CHLORIDE 0.9 % IV SOLN
INTRAVENOUS | Status: DC
  Administered 2015-12-16: 125 mL/h via INTRAVENOUS
  Administered 2015-12-17: 50 mL/h via INTRAVENOUS

## 2015-12-16 MED ORDER — LIDOCAINE HCL (PF) 1 % IJ SOLN
INTRAMUSCULAR | Status: AC
  Filled 2015-12-16: qty 30

## 2015-12-16 MED ORDER — IOHEXOL 350 MG/ML SOLN
INTRAVENOUS | Status: DC | PRN
  Administered 2015-12-16: 70 mL via INTRA_ARTERIAL

## 2015-12-16 MED ORDER — MIDAZOLAM HCL 2 MG/2ML IJ SOLN
INTRAMUSCULAR | Status: AC
  Filled 2015-12-16: qty 2

## 2015-12-16 MED ORDER — MIDAZOLAM HCL 2 MG/2ML IJ SOLN
INTRAMUSCULAR | Status: DC | PRN
  Administered 2015-12-16: 1 mg via INTRAVENOUS

## 2015-12-16 MED ORDER — ONDANSETRON HCL 4 MG/2ML IJ SOLN: 4.0000 mg | Freq: Four times a day (QID) | INTRAMUSCULAR | Status: DC | PRN

## 2015-12-16 MED ORDER — ASPIRIN 81 MG PO CHEW: 81.0000 mg | CHEWABLE_TABLET | ORAL | Status: DC

## 2015-12-16 MED ORDER — LIDOCAINE HCL (PF) 1 % IJ SOLN
INTRAMUSCULAR | Status: DC | PRN
  Administered 2015-12-16: 15 mL

## 2015-12-16 MED ORDER — SODIUM CHLORIDE 0.9 % IV SOLN: 250.0000 mL | INTRAVENOUS | Status: DC | PRN

## 2015-12-16 MED ORDER — ENOXAPARIN SODIUM 40 MG/0.4ML ~~LOC~~ SOLN
40.0000 mg | SUBCUTANEOUS | Status: DC
  Administered 2015-12-17 – 2015-12-18 (×2): 40 mg via SUBCUTANEOUS
  Filled 2015-12-16 (×2): qty 0.4

## 2015-12-16 MED ORDER — SODIUM CHLORIDE 0.9% FLUSH: 3.0000 mL | Freq: Two times a day (BID) | INTRAVENOUS | Status: DC

## 2015-12-16 MED ORDER — DIAZEPAM 5 MG PO TABS
5.0000 mg | ORAL_TABLET | Freq: Four times a day (QID) | ORAL | Status: DC | PRN
  Administered 2015-12-16: 5 mg via ORAL
  Filled 2015-12-16: qty 1

## 2015-12-16 MED ORDER — FENTANYL CITRATE (PF) 100 MCG/2ML IJ SOLN
INTRAMUSCULAR | Status: DC | PRN
  Administered 2015-12-16: 25 ug via INTRAVENOUS

## 2015-12-16 MED ORDER — SODIUM CHLORIDE 0.9 % WEIGHT BASED INFUSION: 1.0000 mL/kg/h | INTRAVENOUS | Status: DC

## 2015-12-16 MED ORDER — SODIUM CHLORIDE 0.9 % WEIGHT BASED INFUSION: 3.0000 mL/kg/h | INTRAVENOUS | Status: DC

## 2015-12-16 MED ORDER — SODIUM CHLORIDE 0.9% FLUSH
3.0000 mL | Freq: Two times a day (BID) | INTRAVENOUS | Status: DC
  Administered 2015-12-16 – 2015-12-17 (×2): 3 mL via INTRAVENOUS

## 2015-12-16 MED ORDER — IOPAMIDOL (ISOVUE-370) INJECTION 76%
INTRAVENOUS | Status: AC
  Filled 2015-12-16: qty 100

## 2015-12-16 MED ORDER — HEPARIN (PORCINE) IN NACL 2-0.9 UNIT/ML-% IJ SOLN
INTRAMUSCULAR | Status: DC | PRN
  Administered 2015-12-16: 1000 mL

## 2015-12-16 MED ORDER — ACETAMINOPHEN 325 MG PO TABS: 650.0000 mg | ORAL_TABLET | ORAL | Status: DC | PRN

## 2015-12-16 MED ORDER — ASPIRIN 81 MG PO CHEW
81.0000 mg | CHEWABLE_TABLET | Freq: Every day | ORAL | Status: DC
  Administered 2015-12-17 – 2015-12-18 (×2): 81 mg via ORAL
  Filled 2015-12-16 (×2): qty 1

## 2015-12-16 SURGICAL SUPPLY — 9 items
CATH INFINITI 5FR MULTPACK ANG (CATHETERS) ×1 IMPLANT
CATH SITESEER 5F NTR (CATHETERS) ×1 IMPLANT
KIT HEART LEFT (KITS) ×2 IMPLANT
PACK CARDIAC CATHETERIZATION (CUSTOM PROCEDURE TRAY) ×2 IMPLANT
SHEATH PINNACLE 5F 10CM (SHEATH) ×1 IMPLANT
SYR MEDRAD MARK V 150ML (SYRINGE) ×2 IMPLANT
TRANSDUCER W/STOPCOCK (MISCELLANEOUS) ×2 IMPLANT
TUBING CIL FLEX 10 FLL-RA (TUBING) ×2 IMPLANT
WIRE EMERALD 3MM-J .035X150CM (WIRE) ×1 IMPLANT

## 2015-12-16 NOTE — Consult Note (Signed)
CARDIOLOGY CONSULT NOTE       Patient ID: [name] MRN: [id number] DOB/AGE: 1932-10-26 82 y.o.  Admit date: 12/15/2015 Referring Physician:  Sarajane Jews Primary Physician: [name] Primary Cardiologist: Stanford Breed Reason for Consultation: Chest Pain  Principal Problem:   Chest pain Active Problems:   Hyponatremia   Hyperglycemia   HPI:   [name] is a 82 y.o. [name] Kitchen female admitted with recurrent chest pain  History of drug-eluting stents the left circumflex in January 2015, proximal LAD showed mild nonobstructive disease with moderately severe disease in a small nondominant right coronary artery. Other history of hypertension, hyperlipidemia, chronic diastolic heart failure, depression and severe panic attacks.  Had lumbar surgery in June with CSF leak and prolonged hospitalization : Dr Stanford Breed order myovue 10/07/15 to clear for surgery and it was non ischemic just apical and septal wall thinning EF 66%  However has been having SSCP similar to previous angina over last few weeks. Frequently exertional and relieved With nitro.  Also has some other non cardiac sounding sharp resting pain that radiates to shoulder and neck  Initial ER evaluation with no troponin elevation or acute ST changes  Pain free this am     ROS All other systems reviewed and negative except as noted above  Past Medical History:  Diagnosis Date  . Anxiety   . Arthritis   . Bursitis    left shoulder  . Cataract   . CHF (congestive heart failure) (HCC)    EF 55-60%  . Complication of anesthesia   . COPD (chronic obstructive pulmonary disease) ([place])   . Coronary atherosclerosis of native coronary artery    a. DES to CX, moderately severe stenosis RCA, mild stenosis LAD 04/2013  . Depression   . Dysphagia, unspecified(787.20)   . GERD (gastroesophageal reflux disease)    Hx Schatzki's ring, multiple EGD/ED last 01/06/2004  . Headache   . Heart disease   . Heart murmur    'a  littel'  . HTN (hypertension)    Hx of it  . Hyperlipemia   . Hyperlipidemia   . Internal hemorrhoids without mention of complication   . MI (myocardial infarction) ([place]) 2006  . Microscopic colitis 2003  . Other and unspecified hyperlipidemia   . Panic disorder without agoraphobia   . Paresthesia    hands, feet  . Pneumonia 12/2011  . PONV (postoperative nausea and vomiting)    'a little nausea"  . PVD (peripheral vascular disease) ([place])   . S/P colonoscopy 09/27/2001   internal hemorrhoids, desc colon inflam polyp, SB BX-chronic duodenitis, colitis  . Shortness of breath   . Sleep disorder    obstructive  . Thyroid disease    recent abnl TSH per pt    Family History  Problem Relation Age of Onset  . Stroke Mother   . Parkinson's disease Father   . Coronary artery disease Other     family Hx-sons  . Cancer Other   . Stroke Other     family Hx  . Hypertension Other     family Hx  . Diabetes Brother   . Heart disease Son     before age 46  . Diabetes Son   . Stroke Daughter 36    Social History   Social History  . Marital status: Divorced    Spouse name: N/A  . Number of children: 5  . Years of education: N/A   Occupational History  . retired    Social History  Main Topics  . Smoking status: Former Smoker    Packs/day: 1.00    Years: 64.00    Types: Cigarettes    Start date: 12/24/1947    Quit date: 11/17/2001  . Smokeless tobacco: Never Used     Comment: Quit smoking in 2003  . Alcohol use No  . Drug use: No  . Sexual activity: No   Other Topics Concern  . Not on file   Social History Narrative   Divorced.   Sister had colon perforation & died from complications in [place], [place]    Past Surgical History:  Procedure Laterality Date  . ABDOMINAL HYSTERECTOMY    . ANTERIOR AND POSTERIOR REPAIR     with resection of vagina  . APPENDECTOMY    . BACK SURGERY    . BIOPSY  07/05/2015   Procedure: BIOPSY;  Surgeon: Daneil Dolin, MD;  Location[facility];  Service: Endoscopy;;  gastric polyp biopsy, ascending colon biopsy  . BLADDER SUSPENSION  11/09/2011   Procedure: TRANSVAGINAL TAPE (TVT) PROCEDURE;  Surgeon: Marissa Nestle, MD;  Location: [facility];  Service: Urology;  Laterality: N/A;  . bladder tack  06/2010  . BREAST LUMPECTOMY  1998   left, benign  . CARDIAC CATHETERIZATION    . CARDIAC CATHETERIZATION    . [place]   left  . CHOLECYSTECTOMY  1998  . COLONOSCOPY  03/16/2011   multiple hyperplastic colon polyps, sigmoid diverticulosis, melanosis coli  . COLONOSCOPY WITH PROPOFOL N/A 07/05/2015   RMR:one 5 mm polyp in descending colon  . CORONARY ANGIOPLASTY WITH STENT PLACEMENT    . ESOPHAGEAL DILATION N/A 07/05/2015   Procedure: ESOPHAGEAL DILATION;  Surgeon: Daneil Dolin, MD;  Location[facility];  Service: Endoscopy;  Laterality: N/A;  . ESOPHAGOGASTRODUODENOSCOPY (EGD) WITH PROPOFOL N/A 07/05/2015   [id number]  . JOINT REPLACEMENT Right 2007  . left hand surgery    . LEFT HEART CATHETERIZATION WITH CORONARY ANGIOGRAM N/A 05/14/2013   Procedure: LEFT HEART CATHETERIZATION WITH CORONARY ANGIOGRAM;  Surgeon: Blane Ohara, MD;  Location: [facility] C[facility];  Service: Cardiovascular;  Laterality: N/A;  . left rotator cuff surgery    . LUMBAR LAMINECTOMY/DECOMPRESSION MICRODISCECTOMY N/A 10/11/2012   Procedure: LUMBAR LAMINECTOMY/DECOMPRESSION MICRODISCECTOMY 2 LEVELS;  Surgeon: Floyce Stakes, MD;  Location: [place] [facility] ORS;  Service: Neurosurgery;  Laterality: N/A;  L3-4 L4-5 Laminectomy  . LUMBAR WOUND DEBRIDEMENT N/A 09/27/2015   Procedure: Exploration of Lumbar Wound w/ Repair CSF Leak/Lumbar Drain Placement;  Surgeon: Leeroy Cha, MD;  Location: [place] [facility] ORS;  Service: Neurosurgery;  Laterality: N/A;  . MALONEY DILATION  03/16/2011   Gastritis. No H.pylori on bx. 21F maloney dilation with disruption of  occult cevical esophageal web  . NASAL SINUS SURGERY    . right knee replacement  2007  . right leg  benign tumor    . SHOULDER SURGERY Left   . TONSILLECTOMY    . unspecified area, hysterectomy  1972   partial     . aspirin EC  81 mg Oral Daily  . carvedilol  12.5 mg Oral BID WC  . doxycycline (VIBRAMYCIN) IV  100 mg Intravenous Q12H  . DULoxetine  60 mg Oral QHS  . enoxaparin (LOVENOX) injection  30 mg Subcutaneous QHS  . fluticasone  2 spray Each Nare Daily  . furosemide  20 mg Oral Daily  . gabapentin  300 mg Oral TID  . isosorbide mononitrate  120 mg Oral q morning -  10a  . loratadine  10 mg Oral q morning - 10a  . pantoprazole  40 mg Oral q morning - 10a  . potassium chloride  20 mEq Oral Daily  . sodium chloride flush  3 mL Intravenous Q12H  . sodium chloride flush  3 mL Intravenous Q12H      Physical Exam: Blood pressure (!) 153/57, pulse 71, temperature 98.2 F (36.8 C), temperature source Oral, resp. rate (!) 24, height 5\' 1"  (1.549 m), weight 176 lb 6.4 oz (80 kg), SpO2 96 %.   Affect appropriate Chronically ill white female  HEENT: poor vision  Neck supple with no adenopathy JVP normal no bruits no thyromegaly Lungs clear with no wheezing and good diaphragmatic motion Heart:  S1/S2 no murmur, no rub, gallop or click PMI normal Abdomen: benighn, BS positve, no tenderness, no AAA no bruit.  No HSM or HJR Distal pulses intact with no bruits No edema Neuro non-focal Skin warm and dry No muscular weakness   Labs:   Lab Results  Component Value Date   WBC 6.1 12/16/2015   HGB 10.0 (L) 12/16/2015   HCT 30.7 (L) 12/16/2015   MCV 85.3 12/16/2015   PLT 328 12/16/2015    Recent Labs Lab 12/16/15 0435  NA 136  K 3.4*  CL 104  CO2 23  BUN 16  CREATININE 1.41*  CALCIUM 8.7*  PROT 6.9  BILITOT 0.4  ALKPHOS 84  ALT 22  AST 23  GLUCOSE 110*   Lab Results  Component Value Date   CKTOTAL 71 10/14/2008   CKMB 1.4 10/14/2008   TROPONINI <0.03 12/16/2015    Lab Results  Component Value Date   CHOL 177 12/16/2015   CHOL 181 11/24/2013   CHOL   10/14/2008    187        ATP III CLASSIFICATION:  <200     mg/dL   Desirable  200-239  mg/dL   Borderline High  >=240    mg/dL   High          Lab Results  Component Value Date   HDL 35 (L) 12/16/2015   HDL 56 11/24/2013   HDL 40 10/14/2008   Lab Results  Component Value Date   LDLCALC 103 (H) 12/16/2015   LDLCALC 103 (H) 11/24/2013   LDLCALC (H) 10/14/2008    118        Total Cholesterol/HDL:CHD Risk Coronary Heart Disease Risk Table                     Men   Women  1/2 Average Risk   3.4   3.3  Average Risk       5.0   4.4  2 X Average Risk   9.6   7.1  3 X Average Risk  23.4   11.0        Use the calculated Patient Ratio above and the CHD Risk Table to determine the patient's CHD Risk.        ATP III CLASSIFICATION (LDL):  <100     mg/dL   Optimal  100-129  mg/dL   Near or Above                    Optimal  130-159  mg/dL   Borderline  160-189  mg/dL   High  >190     mg/dL   Very High   Lab Results  Component Value Date   TRIG 197 (H) 12/16/2015  TRIG 111 11/24/2013   TRIG 147 10/14/2008   Lab Results  Component Value Date   CHOLHDL 5.1 12/16/2015   CHOLHDL 3.2 11/24/2013   CHOLHDL 4.7 10/14/2008   No results found for: LDLDIRECT    Radiology: Dg Chest 2 View  Result Date: 12/15/2015 CLINICAL DATA:  Nonproductive cough since last night associated with sharp anterior chest discomfort today, no pleuritic component EXAM: CHEST  2 VIEW COMPARISON:  PA and lateral chest x-ray and chest CT scan of October 15, 2015 FINDINGS: The lungs are adequately inflated. The interstitial markings are coarse but stable. The heart is normal in size. The pulmonary vascularity is not engorged. There is calcification in the wall of the aortic arch. There is no pleural effusion. There are old left lateral rib deformities. IMPRESSION: Chronic bronchitic changes. No alveolar pneumonia nor CHF nor other acute cardiopulmonary abnormality. Aortic atherosclerosis. Electronically Signed    By: David  Martinique M.D.   On: 12/15/2015 16:15   Ct Angio Chest Pe W And/or Wo Contrast  Result Date: 12/15/2015 CLINICAL DATA:  Nonproductive cough since last night. Pain under the breast. EXAM: CT ANGIOGRAPHY CHEST WITH CONTRAST TECHNIQUE: Multidetector CT imaging of the chest was performed using the standard protocol during bolus administration of intravenous contrast. Multiplanar CT image reconstructions and MIPs were obtained to evaluate the vascular anatomy. CONTRAST:  100 mL Isovue 370 COMPARISON:  10/15/2015 FINDINGS: Mediastinum/Lymph Nodes: No pulmonary emboli or thoracic aortic dissection identified. Thoracic aortic atherosclerosis. Normal size heart size. Coronary artery atherosclerosis in the LAD, circumflex, left main and RCA. Enlarged subcarinal lymph node measuring 11 mm in short axis. Lungs/Pleura: No focal consolidation, pleural effusion or pneumothorax. Mild reticular nodular interstitial disease in the right upper lobe. Bilateral patchy areas of ground-glass opacity. Upper abdomen: No acute findings. Musculoskeletal: No chest wall mass or suspicious bone lesions identified. Review of the MIP images confirms the above findings. IMPRESSION: 1. No pulmonary embolus. 2. No thoracic aortic dissection. 3. Mild reticular nodular interstitial disease in the right upper lobe which may be secondary to an infectious or inflammatory etiology. Electronically Signed   By: Kathreen Devoid   On: 12/15/2015 20:01    EKG:  SR no acute ST changes    ASSESSMENT AND PLAN:  Chest Pain: Recurrent with history of circumflex stent 2015 recent myovue with no ischemia Pain responsive to  Nitro Will transfer to [facility] for cath today. No need for heparin as enzymes negative. Hydrate with normal EF And mildly elevated Cr  HTN:  Well controlled.  Continue current medications and low sodium Dash type diet.    Chol:   Cholesterol is at goal.  Continue current dose of statin and diet Rx.  No myalgias or side effects.   F/U  LFT's in 6 months. Lab Results  Component Value Date   LDLCALC 103 (H) 12/16/2015              Signed: Jenkins Rouge 12/16/2015, 9:14 AM

## 2015-12-16 NOTE — Interval H&P Note (Signed)
Cath Lab Visit (complete for each Cath Lab visit)  Clinical Evaluation Leading to the Procedure:   ACS: No.  Non-ACS:    Anginal Classification: CCS III  Anti-ischemic medical therapy: Maximal Therapy (2 or more classes of medications)  Non-Invasive Test Results: No non-invasive testing performed  Prior CABG: No previous CABG      History and Physical Interval Note:  12/16/2015 3:04 PM  [name]  has presented today for surgery, with the diagnosis of CP  The various methods of treatment have been discussed with the patient and family. After consideration of risks, benefits and other options for treatment, the patient has consented to  Procedure(s): Left Heart Cath and Coronary Angiography (N/A) as a surgical intervention .  The patient's history has been reviewed, patient examined, no change in status, stable for surgery.  I have reviewed the patient's chart and labs.  Questions were answered to the patient's satisfaction.     Shelva Majestic

## 2015-12-16 NOTE — Progress Notes (Signed)
Site area: rt groin Site Prior to Removal:  Level 0 Pressure Applied For:  20 minutes Manual:   yes Patient Status During Pull:  stable Post Pull Site:  Level  0 Post Pull Instructions Given:  yes Post Pull Pulses Present: yes Dressing Applied:  tegaderm Bedrest begins @ [id number] Comments:

## 2015-12-16 NOTE — H&P (View-Only) (Signed)
CARDIOLOGY CONSULT NOTE       Patient ID: [name] MRN: [id number] DOB/AGE: 22-Oct-1932 82 y.o.  Admit date: 12/15/2015 Referring Physician:  Sarajane Jews Primary Physician: [name] Primary Cardiologist: Stanford Breed Reason for Consultation: Chest Pain  Principal Problem:   Chest pain Active Problems:   Hyponatremia   Hyperglycemia   HPI:   [name] is a 82 y.o. [name] Kitchen female admitted with recurrent chest pain  History of drug-eluting stents the left circumflex in January 2015, proximal LAD showed mild nonobstructive disease with moderately severe disease in a small nondominant right coronary artery. Other history of hypertension, hyperlipidemia, chronic diastolic heart failure, depression and severe panic attacks.  Had lumbar surgery in June with CSF leak and prolonged hospitalization : Dr Stanford Breed order myovue 10/07/15 to clear for surgery and it was non ischemic just apical and septal wall thinning EF 66%  However has been having SSCP similar to previous angina over last few weeks. Frequently exertional and relieved With nitro.  Also has some other non cardiac sounding sharp resting pain that radiates to shoulder and neck  Initial ER evaluation with no troponin elevation or acute ST changes  Pain free this am     ROS All other systems reviewed and negative except as noted above  Past Medical History:  Diagnosis Date  . Anxiety   . Arthritis   . Bursitis    left shoulder  . Cataract   . CHF (congestive heart failure) (HCC)    EF 55-60%  . Complication of anesthesia   . COPD (chronic obstructive pulmonary disease) ([place])   . Coronary atherosclerosis of native coronary artery    a. DES to CX, moderately severe stenosis RCA, mild stenosis LAD 04/2013  . Depression   . Dysphagia, unspecified(787.20)   . GERD (gastroesophageal reflux disease)    Hx Schatzki's ring, multiple EGD/ED last 01/06/2004  . Headache   . Heart disease   . Heart murmur    'a  littel'  . HTN (hypertension)    Hx of it  . Hyperlipemia   . Hyperlipidemia   . Internal hemorrhoids without mention of complication   . MI (myocardial infarction) ([place]) 2006  . Microscopic colitis 2003  . Other and unspecified hyperlipidemia   . Panic disorder without agoraphobia   . Paresthesia    hands, feet  . Pneumonia 12/2011  . PONV (postoperative nausea and vomiting)    'a little nausea"  . PVD (peripheral vascular disease) ([place])   . S/P colonoscopy 09/27/2001   internal hemorrhoids, desc colon inflam polyp, SB BX-chronic duodenitis, colitis  . Shortness of breath   . Sleep disorder    obstructive  . Thyroid disease    recent abnl TSH per pt    Family History  Problem Relation Age of Onset  . Stroke Mother   . Parkinson's disease Father   . Coronary artery disease Other     family Hx-sons  . Cancer Other   . Stroke Other     family Hx  . Hypertension Other     family Hx  . Diabetes Brother   . Heart disease Son     before age 83  . Diabetes Son   . Stroke Daughter 47    Social History   Social History  . Marital status: Divorced    Spouse name: N/A  . Number of children: 5  . Years of education: N/A   Occupational History  . retired    Social History  Main Topics  . Smoking status: Former Smoker    Packs/day: 1.00    Years: 64.00    Types: Cigarettes    Start date: 12/24/1947    Quit date: 11/17/2001  . Smokeless tobacco: Never Used     Comment: Quit smoking in 2003  . Alcohol use No  . Drug use: No  . Sexual activity: No   Other Topics Concern  . Not on file   Social History Narrative   Divorced.   Sister had colon perforation & died from complications in [place], [place]    Past Surgical History:  Procedure Laterality Date  . ABDOMINAL HYSTERECTOMY    . ANTERIOR AND POSTERIOR REPAIR     with resection of vagina  . APPENDECTOMY    . BACK SURGERY    . BIOPSY  07/05/2015   Procedure: BIOPSY;  Surgeon: Daneil Dolin, MD;  [facility];  Service: Endoscopy;;  gastric polyp biopsy, ascending colon biopsy  . BLADDER SUSPENSION  11/09/2011   Procedure: TRANSVAGINAL TAPE (TVT) PROCEDURE;  Surgeon: Marissa Nestle, MD;  Location: [facility] ORS;  Service: Urology;  Laterality: N/A;  . bladder tack  06/2010  . BREAST LUMPECTOMY  1998   left, benign  . CARDIAC CATHETERIZATION    . CARDIAC CATHETERIZATION    . [place]   left  . CHOLECYSTECTOMY  1998  . COLONOSCOPY  03/16/2011   multiple hyperplastic colon polyps, sigmoid diverticulosis, melanosis coli  . COLONOSCOPY WITH PROPOFOL N/A 07/05/2015   RMR:one 5 mm polyp in descending colon  . CORONARY ANGIOPLASTY WITH STENT PLACEMENT    . ESOPHAGEAL DILATION N/A 07/05/2015   Procedure: ESOPHAGEAL DILATION;  Surgeon: Daneil Dolin, MD;  Location[facility];  Service: Endoscopy;  Laterality: N/A;  . ESOPHAGOGASTRODUODENOSCOPY (EGD) WITH PROPOFOL N/A 07/05/2015   [id number]  . JOINT REPLACEMENT Right 2007  . left hand surgery    . LEFT HEART CATHETERIZATION WITH CORONARY ANGIOGRAM N/A 05/14/2013   Procedure: LEFT HEART CATHETERIZATION WITH CORONARY ANGIOGRAM;  Surgeon: Blane Ohara, MD;  Location: [facility] CATH LAB;  Service: Cardiovascular;  Laterality: N/A;  . left rotator cuff surgery    . LUMBAR LAMINECTOMY/DECOMPRESSION MICRODISCECTOMY N/A 10/11/2012   Procedure: LUMBAR LAMINECTOMY/DECOMPRESSION MICRODISCECTOMY 2 LEVELS;  Surgeon: Floyce Stakes, MD;  Location: [place] NEURO ORS;  Service: Neurosurgery;  Laterality: N/A;  L3-4 L4-5 Laminectomy  . LUMBAR WOUND DEBRIDEMENT N/A 09/27/2015   Procedure: Exploration of Lumbar Wound w/ Repair CSF Leak/Lumbar Drain Placement;  Surgeon: Leeroy Cha, MD;  Location: [place] NEURO ORS;  Service: Neurosurgery;  Laterality: N/A;  . MALONEY DILATION  03/16/2011   Gastritis. No H.pylori on bx. 13F maloney dilation with disruption of  occult cevical esophageal web  . NASAL SINUS SURGERY    . right knee replacement  2007  . right leg  benign tumor    . SHOULDER SURGERY Left   . TONSILLECTOMY    . unspecified area, hysterectomy  1972   partial     . aspirin EC  81 mg Oral Daily  . carvedilol  12.5 mg Oral BID WC  . doxycycline (VIBRAMYCIN) IV  100 mg Intravenous Q12H  . DULoxetine  60 mg Oral QHS  . enoxaparin (LOVENOX) injection  30 mg Subcutaneous QHS  . fluticasone  2 spray Each Nare Daily  . furosemide  20 mg Oral Daily  . gabapentin  300 mg Oral TID  . isosorbide mononitrate  120 mg Oral q morning -  10a  . loratadine  10 mg Oral q morning - 10a  . pantoprazole  40 mg Oral q morning - 10a  . potassium chloride  20 mEq Oral Daily  . sodium chloride flush  3 mL Intravenous Q12H  . sodium chloride flush  3 mL Intravenous Q12H      Physical Exam: Blood pressure (!) 153/57, pulse 71, temperature 98.2 F (36.8 C), temperature source Oral, resp. rate (!) 24, height 5\' 1"  (1.549 m), weight 176 lb 6.4 oz (80 kg), SpO2 96 %.   Affect appropriate Chronically ill white female  HEENT: poor vision  Neck supple with no adenopathy JVP normal no bruits no thyromegaly Lungs clear with no wheezing and good diaphragmatic motion Heart:  S1/S2 no murmur, no rub, gallop or click PMI normal Abdomen: benighn, BS positve, no tenderness, no AAA no bruit.  No HSM or HJR Distal pulses intact with no bruits No edema Neuro non-focal Skin warm and dry No muscular weakness   Labs:   Lab Results  Component Value Date   WBC 6.1 12/16/2015   HGB 10.0 (L) 12/16/2015   HCT 30.7 (L) 12/16/2015   MCV 85.3 12/16/2015   PLT 328 12/16/2015    Recent Labs Lab 12/16/15 0435  NA 136  K 3.4*  CL 104  CO2 23  BUN 16  CREATININE 1.41*  CALCIUM 8.7*  PROT 6.9  BILITOT 0.4  ALKPHOS 84  ALT 22  AST 23  GLUCOSE 110*   Lab Results  Component Value Date   CKTOTAL 71 10/14/2008   CKMB 1.4 10/14/2008   TROPONINI <0.03 12/16/2015    Lab Results  Component Value Date   CHOL 177 12/16/2015   CHOL 181 11/24/2013   CHOL   10/14/2008    187        ATP III CLASSIFICATION:  <200     mg/dL   Desirable  200-239  mg/dL   Borderline High  >=240    mg/dL   High          Lab Results  Component Value Date   HDL 35 (L) 12/16/2015   HDL 56 11/24/2013   HDL 40 10/14/2008   Lab Results  Component Value Date   LDLCALC 103 (H) 12/16/2015   LDLCALC 103 (H) 11/24/2013   LDLCALC (H) 10/14/2008    118        Total Cholesterol/HDL:CHD Risk Coronary Heart Disease Risk Table                     Men   Women  1/2 Average Risk   3.4   3.3  Average Risk       5.0   4.4  2 X Average Risk   9.6   7.1  3 X Average Risk  23.4   11.0        Use the calculated Patient Ratio above and the CHD Risk Table to determine the patient's CHD Risk.        ATP III CLASSIFICATION (LDL):  <100     mg/dL   Optimal  100-129  mg/dL   Near or Above                    Optimal  130-159  mg/dL   Borderline  160-189  mg/dL   High  >190     mg/dL   Very High   Lab Results  Component Value Date   TRIG 197 (H) 12/16/2015  TRIG 111 11/24/2013   TRIG 147 10/14/2008   Lab Results  Component Value Date   CHOLHDL 5.1 12/16/2015   CHOLHDL 3.2 11/24/2013   CHOLHDL 4.7 10/14/2008   No results found for: LDLDIRECT    Radiology: Dg Chest 2 View  Result Date: 12/15/2015 CLINICAL DATA:  Nonproductive cough since last night associated with sharp anterior chest discomfort today, no pleuritic component EXAM: CHEST  2 VIEW COMPARISON:  PA and lateral chest x-ray and chest CT scan of October 15, 2015 FINDINGS: The lungs are adequately inflated. The interstitial markings are coarse but stable. The heart is normal in size. The pulmonary vascularity is not engorged. There is calcification in the wall of the aortic arch. There is no pleural effusion. There are old left lateral rib deformities. IMPRESSION: Chronic bronchitic changes. No alveolar pneumonia nor CHF nor other acute cardiopulmonary abnormality. Aortic atherosclerosis. Electronically Signed    By: David  Martinique M.D.   On: 12/15/2015 16:15   Ct Angio Chest Pe W And/or Wo Contrast  Result Date: 12/15/2015 CLINICAL DATA:  Nonproductive cough since last night. Pain under the breast. EXAM: CT ANGIOGRAPHY CHEST WITH CONTRAST TECHNIQUE: Multidetector CT imaging of the chest was performed using the standard protocol during bolus administration of intravenous contrast. Multiplanar CT image reconstructions and MIPs were obtained to evaluate the vascular anatomy. CONTRAST:  100 mL Isovue 370 COMPARISON:  10/15/2015 FINDINGS: Mediastinum/Lymph Nodes: No pulmonary emboli or thoracic aortic dissection identified. Thoracic aortic atherosclerosis. Normal size heart size. Coronary artery atherosclerosis in the LAD, circumflex, left main and RCA. Enlarged subcarinal lymph node measuring 11 mm in short axis. Lungs/Pleura: No focal consolidation, pleural effusion or pneumothorax. Mild reticular nodular interstitial disease in the right upper lobe. Bilateral patchy areas of ground-glass opacity. Upper abdomen: No acute findings. Musculoskeletal: No chest wall mass or suspicious bone lesions identified. Review of the MIP images confirms the above findings. IMPRESSION: 1. No pulmonary embolus. 2. No thoracic aortic dissection. 3. Mild reticular nodular interstitial disease in the right upper lobe which may be secondary to an infectious or inflammatory etiology. Electronically Signed   By: Kathreen Devoid   On: 12/15/2015 20:01    EKG:  SR no acute ST changes    ASSESSMENT AND PLAN:  Chest Pain: Recurrent with history of circumflex stent 2015 recent myovue with no ischemia Pain responsive to  Nitro Will transfer to [facility] for [facility] today. No need for heparin as enzymes negative. Hydrate with normal EF And mildly elevated Cr  HTN:  Well controlled.  Continue current medications and low sodium Dash type diet.    Chol:   Cholesterol is at goal.  Continue current dose of statin and diet Rx.  No myalgias or side effects.   F/U  LFT's in 6 months. Lab Results  Component Value Date   LDLCALC 103 (H) 12/16/2015              Signed: Jenkins Rouge 12/16/2015, 9:14 AM

## 2015-12-16 NOTE — Progress Notes (Signed)
Patient transferred to [facility] via [facility]..  Patient and family aware and agree with plan. Belongings sent with patient. Report called and given to [facility].

## 2015-12-17 ENCOUNTER — Inpatient Hospital Stay (HOSPITAL_COMMUNITY): Payer: Medicare Other

## 2015-12-17 ENCOUNTER — Encounter (HOSPITAL_COMMUNITY): Payer: Self-pay | Admitting: Cardiovascular Disease

## 2015-12-17 DIAGNOSIS — E871 Hypo-osmolality and hyponatremia: Secondary | ICD-10-CM

## 2015-12-17 DIAGNOSIS — R739 Hyperglycemia, unspecified: Secondary | ICD-10-CM

## 2015-12-17 DIAGNOSIS — R079 Chest pain, unspecified: Secondary | ICD-10-CM

## 2015-12-17 LAB — CBC
HCT: 28.4 % — ABNORMAL LOW (ref 36.0–46.0)
Hemoglobin: 9 g/dL — ABNORMAL LOW (ref 12.0–15.0)
MCH: 27.1 pg (ref 26.0–34.0)
MCHC: 31.7 g/dL (ref 30.0–36.0)
MCV: 85.5 fL (ref 78.0–100.0)
PLATELETS: 249 10*3/uL (ref 150–400)
RBC: 3.32 MIL/uL — ABNORMAL LOW (ref 3.87–5.11)
RDW: 14.1 % (ref 11.5–15.5)
WBC: 5.5 10*3/uL (ref 4.0–10.5)

## 2015-12-17 LAB — BASIC METABOLIC PANEL
Anion gap: 10 (ref 5–15)
BUN: 11 mg/dL (ref 6–20)
CALCIUM: 8.6 mg/dL — AB (ref 8.9–10.3)
CO2: 24 mmol/L (ref 22–32)
Chloride: 104 mmol/L (ref 101–111)
Creatinine, Ser: 1.3 mg/dL — ABNORMAL HIGH (ref 0.44–1.00)
GFR calc Af Amer: 43 mL/min — ABNORMAL LOW (ref >60)
GFR, EST NON AFRICAN AMERICAN: 37 mL/min — AB (ref >60)
GLUCOSE: 106 mg/dL — AB (ref 65–99)
POTASSIUM: 3.7 mmol/L (ref 3.5–5.1)
Sodium: 138 mmol/L (ref 135–145)

## 2015-12-17 LAB — ECHOCARDIOGRAM COMPLETE
Height: 61 in
WEIGHTICAEL: 2852.8 [oz_av]

## 2015-12-17 MED ORDER — IPRATROPIUM-ALBUTEROL 0.5-2.5 (3) MG/3ML IN SOLN
RESPIRATORY_TRACT | Status: AC
  Filled 2015-12-17: qty 3

## 2015-12-17 MED ORDER — DOXYCYCLINE HYCLATE 100 MG PO TABS
100.0000 mg | ORAL_TABLET | Freq: Two times a day (BID) | ORAL | Status: DC
  Administered 2015-12-17 – 2015-12-18 (×3): 100 mg via ORAL
  Filled 2015-12-17 (×3): qty 1

## 2015-12-17 MED ORDER — IPRATROPIUM-ALBUTEROL 0.5-2.5 (3) MG/3ML IN SOLN
3.0000 mL | Freq: Four times a day (QID) | RESPIRATORY_TRACT | Status: DC
  Administered 2015-12-17 – 2015-12-18 (×4): 3 mL via RESPIRATORY_TRACT
  Filled 2015-12-17 (×4): qty 3

## 2015-12-17 NOTE — Progress Notes (Signed)
PROGRESS NOTE    [name]  [id number] DOB: 24-Jan-1933 DOA: 12/15/2015 PCP: [name]    Brief Narrative: [name]  is a 82 y.o. female, with CAD, Pafib (Chads2vasc= 6), CHF (EF 60%), apparently presents with c/o chest pain.  Under the left breast.  Radiation towards the left shoulder.  "similar to prior cardiac chest pain" per pt.  " sharp"   + cough,  Dry.  Has been having chest pain for the past 2 days.  + dyspnea (stable), Denies fever, chills, n/v, heartburn, diarrhea, brbpr, black stool.  The patient was having physical therapy, who called RN and pt was told to come to ER for evaluation.    In ED,  EKG:  NSR at 73, LAD, poor R progression, slight st depression in V4-6.  Pt is presently cp free.  Pt will be admitted for CP  Assessment & Plan:   Principal Problem:   Chest pain Active Problems:   Hyponatremia   Hyperglycemia   CAD in native artery  1.  Cp Trop I q6h x3 LDL 103.  S/P cath  showed widely patent previously placed Left Crx stent, with small nondominant RCA with 80% ostial stenosis. Medical management.   2. Acute bronchitis, pneumonitis.  Cough CTA chest 8/30=> negative for PE,  Mild reticular nodular interstitial disease in the right upper lobe ? Infectious or inflammatory Doxycycline 100 mg Allergy to prednisone ? Duo neb ordered.    3.Hyponatremia resolved   4. AKI Creatinine probably elevated due to recent Bactrim use.  Improved.   5.  CHF (EF 55-605) Cont lasix, cont carvedilol  6. CAD (nuclear stress test 10/07/2015=> low risk) Cont aspirin, cont carvedilol, cont imdur.  Pt states statin intolerant LDL 103  7. Gerd Cont protonix       DVT prophylaxis: lovenox Code Status: full code.  Family Communication: care discussed with patient  Disposition Plan: home in 24 to 48 hours.    Consultants:   Cardiology.    Procedures:   Cath; Ost RCA lesion, 80 %stenosed.  Ost Cx to Prox Cx lesion, 0  %stenosed.  Ost LAD to Prox LAD lesion, 20 %stenosed.  1st Diag lesion, 20 %stenosed.  4th Mrg lesion, 20 %stenosed.  The left ventricular systolic function is normal.  LV end diastolic pressure is mildly elevated.  The left ventricular ejection fraction is 60-65% by visual estimate.   Antimicrobials:   doxy   Subjective: Report chest congestion, tightness, wheezing.   Objective: Vitals:   12/16/15 2025 12/17/15 0218 12/17/15 0608 12/17/15 0800  BP: (!) 141/65  114/65 132/60  Pulse: 70     Resp:      Temp: 97.9 F (36.6 C)  97.8 F (36.6 C)   TempSrc: Oral  Oral   SpO2:      Weight:  80.9 kg (178 lb 4.8 oz)    Height:        Intake/Output Summary (Last 24 hours) at 12/17/15 1000 Last data filed at 12/17/15 0855  Gross per 24 hour  Intake          2082.58 ml  Output             1350 ml  Net           732.58 ml   Filed Weights   12/16/15 0522 12/16/15 1102 12/17/15 0218  Weight: 80 kg (176 lb 6.4 oz) 79.9 kg (176 lb 2.4 oz) 80.9 kg (178 lb 4.8 oz)    Examination:  General exam: Appears calm and comfortable  Respiratory system: Clear to auscultation. Respiratory effort normal. Cardiovascular system: S1 & S2 heard, RRR. No JVD, murmurs, rubs, gallops or clicks. No pedal edema. Gastrointestinal system: Abdomen is nondistended, soft and nontender. No organomegaly or masses felt. Normal bowel sounds heard. Central nervous system: Alert and oriented. No focal neurological deficits. Extremities: Symmetric 5 x 5 power. Skin: No rashes, lesions or ulcers Psychiatry: Judgement and insight appear normal. Mood & affect appropriate.     Data Reviewed: I have personally reviewed following labs and imaging studies  CBC:  Recent Labs Lab 12/15/15 1608 12/16/15 0435 12/16/15 1931 12/17/15 0552  WBC 7.1 6.1 5.5 5.5  NEUTROABS 3.6  --   --   --   HGB 10.7* 10.0* 9.3* 9.0*  HCT 32.5* 30.7* 29.1* 28.4*  MCV 83.5 85.3 85.8 85.5  PLT 327 328 264 0000000   Basic  Metabolic Panel:  Recent Labs Lab 12/15/15 1608 12/16/15 0435 12/16/15 1931 12/17/15 0552  NA 133* 136  --  138  K 3.5 3.4*  --  3.7  CL 99* 104  --  104  CO2 24 23  --  24  GLUCOSE 149* 110*  --  106*  BUN 20 16  --  11  CREATININE 1.45* 1.41* 1.45* 1.30*  CALCIUM 9.3 8.7*  --  8.6*   GFR: Estimated Creatinine Clearance: 32.7 mL/min (by C-G formula based on SCr of 1.3 mg/dL). Liver Function Tests:  Recent Labs Lab 12/16/15 0435  AST 23  ALT 22  ALKPHOS 84  BILITOT 0.4  PROT 6.9  ALBUMIN 3.7   No results for input(s): LIPASE, AMYLASE in the last 168 hours. No results for input(s): AMMONIA in the last 168 hours. Coagulation Profile:  Recent Labs Lab 12/16/15 1231  INR 1.01   Cardiac Enzymes:  Recent Labs Lab 12/15/15 1608 12/15/15 2255 12/16/15 0435  TROPONINI <0.03 <0.03 <0.03   BNP (last 3 results) No results for input(s): PROBNP in the last 8760 hours. HbA1C: No results for input(s): HGBA1C in the last 72 hours. CBG: No results for input(s): GLUCAP in the last 168 hours. Lipid Profile:  Recent Labs  12/16/15 0435  CHOL 177  HDL 35*  LDLCALC 103*  TRIG 197*  CHOLHDL 5.1   Thyroid Function Tests: No results for input(s): TSH, T4TOTAL, FREET4, T3FREE, THYROIDAB in the last 72 hours. Anemia Panel: No results for input(s): VITAMINB12, FOLATE, FERRITIN, TIBC, IRON, RETICCTPCT in the last 72 hours. Sepsis Labs: No results for input(s): PROCALCITON, LATICACIDVEN in the last 168 hours.  No results found for this or any previous visit (from the past 240 hour(s)).       Radiology Studies: Dg Chest 2 View  Result Date: 12/15/2015 CLINICAL DATA:  Nonproductive cough since last night associated with sharp anterior chest discomfort today, no pleuritic component EXAM: CHEST  2 VIEW COMPARISON:  PA and lateral chest x-ray and chest CT scan of October 15, 2015 FINDINGS: The lungs are adequately inflated. The interstitial markings are coarse but stable.  The heart is normal in size. The pulmonary vascularity is not engorged. There is calcification in the wall of the aortic arch. There is no pleural effusion. There are old left lateral rib deformities. IMPRESSION: Chronic bronchitic changes. No alveolar pneumonia nor CHF nor other acute cardiopulmonary abnormality. Aortic atherosclerosis. Electronically Signed   By: David  Martinique M.D.   On: 12/15/2015 16:15   Ct Angio Chest Pe W And/or Wo Contrast  Result Date: 12/15/2015  CLINICAL DATA:  Nonproductive cough since last night. Pain under the breast. EXAM: CT ANGIOGRAPHY CHEST WITH CONTRAST TECHNIQUE: Multidetector CT imaging of the chest was performed using the standard protocol during bolus administration of intravenous contrast. Multiplanar CT image reconstructions and MIPs were obtained to evaluate the vascular anatomy. CONTRAST:  100 mL Isovue 370 COMPARISON:  10/15/2015 FINDINGS: Mediastinum/Lymph Nodes: No pulmonary emboli or thoracic aortic dissection identified. Thoracic aortic atherosclerosis. Normal size heart size. Coronary artery atherosclerosis in the LAD, circumflex, left main and RCA. Enlarged subcarinal lymph node measuring 11 mm in short axis. Lungs/Pleura: No focal consolidation, pleural effusion or pneumothorax. Mild reticular nodular interstitial disease in the right upper lobe. Bilateral patchy areas of ground-glass opacity. Upper abdomen: No acute findings. Musculoskeletal: No chest wall mass or suspicious bone lesions identified. Review of the MIP images confirms the above findings. IMPRESSION: 1. No pulmonary embolus. 2. No thoracic aortic dissection. 3. Mild reticular nodular interstitial disease in the right upper lobe which may be secondary to an infectious or inflammatory etiology. Electronically Signed   By: Kathreen Devoid   On: 12/15/2015 20:01        Scheduled Meds: . aspirin  81 mg Oral Daily  . carvedilol  12.5 mg Oral BID WC  . doxycycline (VIBRAMYCIN) IV  100 mg  Intravenous Q12H  . DULoxetine  60 mg Oral QHS  . enoxaparin (LOVENOX) injection  40 mg Subcutaneous Q24H  . fluticasone  2 spray Each Nare Daily  . furosemide  20 mg Oral Daily  . gabapentin  300 mg Oral TID  . ipratropium-albuterol  3 mL Nebulization Q6H  . isosorbide mononitrate  120 mg Oral q morning - 10a  . loratadine  10 mg Oral q morning - 10a  . pantoprazole  40 mg Oral q morning - 10a  . potassium chloride  20 mEq Oral Daily  . sodium chloride flush  3 mL Intravenous Q12H  . sodium chloride flush  3 mL Intravenous Q12H  . sodium chloride flush  3 mL Intravenous Q12H   Continuous Infusions:    LOS: 1 day    Time spent: 35 minutes.     Elmarie Shiley, MD [facility] Pager [phone]  If 7PM-7AM, please contact night-coverage [email or web address] Password [email or web address] 12/17/2015, 10:00 AM

## 2015-12-17 NOTE — Progress Notes (Signed)
Turned off patient's O2 via Nasal cannula to see how she compensates during activity without oxygen. Expiratory wheezing noted but patient reports that is her baseline so nasal cannula will remain off. Patient does not wear oxygen at home.  Lenna Sciara, RN 12/17/2015

## 2015-12-17 NOTE — Progress Notes (Signed)
  Echocardiogram 2D Echocardiogram has been performed.  Aristidis Talerico 12/17/2015, 11:26 AM

## 2015-12-17 NOTE — Progress Notes (Signed)
Patient Name: [name] Date of Encounter: 12/17/2015  Hospital Problem List     Principal Problem:   Chest pain Active Problems:   Hyponatremia   Hyperglycemia   CAD in native artery    Subjective   No further reports of chest pain, but feels slightly short of breath this morning. Wanting to get up into the chair today.   Inpatient Medications    . aspirin  81 mg Oral Daily  . carvedilol  12.5 mg Oral BID WC  . doxycycline (VIBRAMYCIN) IV  100 mg Intravenous Q12H  . DULoxetine  60 mg Oral QHS  . enoxaparin (LOVENOX) injection  40 mg Subcutaneous Q24H  . fluticasone  2 spray Each Nare Daily  . furosemide  20 mg Oral Daily  . gabapentin  300 mg Oral TID  . isosorbide mononitrate  120 mg Oral q morning - 10a  . loratadine  10 mg Oral q morning - 10a  . pantoprazole  40 mg Oral q morning - 10a  . potassium chloride  20 mEq Oral Daily  . sodium chloride flush  3 mL Intravenous Q12H  . sodium chloride flush  3 mL Intravenous Q12H  . sodium chloride flush  3 mL Intravenous Q12H    Vital Signs    Vitals:   12/16/15 2025 12/17/15 0218 12/17/15 0608 12/17/15 0800  BP: (!) 141/65  114/65 132/60  Pulse: 70     Resp:      Temp: 97.9 F (36.6 C)  97.8 F (36.6 C)   TempSrc: Oral  Oral   SpO2:      Weight:  178 lb 4.8 oz (80.9 kg)    Height:        Intake/Output Summary (Last 24 hours) at 12/17/15 0945 Last data filed at 12/17/15 0855  Gross per 24 hour  Intake          2082.58 ml  Output             1350 ml  Net           732.58 ml   Filed Weights   12/16/15 0522 12/16/15 1102 12/17/15 0218  Weight: 176 lb 6.4 oz (80 kg) 176 lb 2.4 oz (79.9 kg) 178 lb 4.8 oz (80.9 kg)    Physical Exam    General: Pleasant older caucasian female , NAD. Neuro: Alert and oriented X 3. Moves all extremities spontaneously. Psych: Normal affect. HEENT:  Normal  Neck: Supple without bruits or JVD. Lungs:  Resp regular and unlabored, Mild expiratory wheeze. Heart: RRR no s3,  s4, or murmurs. Abdomen: Soft, non-tender, non-distended, BS + x 4.  Extremities: No clubbing, cyanosis or edema. DP/PT/Radials 2+ and equal bilaterally. Right femoral cath site stable.   Labs    CBC  Recent Labs  12/15/15 1608  12/16/15 1931 12/17/15 0552  WBC 7.1  < > 5.5 5.5  NEUTROABS 3.6  --   --   --   HGB 10.7*  < > 9.3* 9.0*  HCT 32.5*  < > 29.1* 28.4*  MCV 83.5  < > 85.8 85.5  PLT 327  < > 264 249  < > = values in this interval not displayed. Basic Metabolic Panel  Recent Labs  12/16/15 0435 12/16/15 1931 12/17/15 0552  NA 136  --  138  K 3.4*  --  3.7  CL 104  --  104  CO2 23  --  24  GLUCOSE 110*  --  106*  BUN 16  --  11  CREATININE 1.41* 1.45* 1.30*  CALCIUM 8.7*  --  8.6*   Liver Function Tests  Recent Labs  12/16/15 0435  AST 23  ALT 22  ALKPHOS 84  BILITOT 0.4  PROT 6.9  ALBUMIN 3.7   No results for input(s): LIPASE, AMYLASE in the last 72 hours. Cardiac Enzymes  Recent Labs  12/15/15 1608 12/15/15 2255 12/16/15 0435  TROPONINI <0.03 <0.03 <0.03   BNP Invalid input(s): POCBNP D-Dimer  Recent Labs  12/15/15 1608  DDIMER 1.89*   Hemoglobin A1C No results for input(s): HGBA1C in the last 72 hours. Fasting Lipid Panel  Recent Labs  12/16/15 0435  CHOL 177  HDL 35*  LDLCALC 103*  TRIG 197*  CHOLHDL 5.1   Thyroid Function Tests No results for input(s): TSH, T4TOTAL, T3FREE, THYROIDAB in the last 72 hours.  Invalid input(s): FREET3  Telemetry    SR  ECG    SR, QT prolongation.   Radiology      Assessment & Plan    82 yo female with CAD s/p DES to Left Crx (1/15), HTN, HLD, diastolic CHF, depression and severe panic attacks who presented to [facility] ED with SSCP on 12/15/15.   1. Chest pain: Her pain was responsive to nitro while at [facility] and there was concern for ACS. She was transferred to [facility] for a cath. Cath yesterday with Dr. Claiborne Billings showed widely patent previously placed Left Crx stent, with small  nondominant RCA with 80% ostial stenosis. Recommendations were continuation of medical therapy at this time. Normal LV function of 60-65%, with evidence of left ventricular hypertrophy.  -- Cr stable post cath. Continue to follow BMET. Hgb with slight decline, monitor CBC.  -- No further reports of chest pain since admission, some wheezing present on exam today, going to receive breathing treatment.   2. HTN: Controlled. Continue on current medications.  3. HLD: Not currently on a statin, reports she attempted to take in the past, but developed myalgias. Attempting to control with diet.   4. QT prolongation: appears to be new on the EKG this morning. Would avoid ant QT prolonging medications.     Signed, Reino Bellis NP-C Pager [phone]  History and all data above reviewed.  Patient examined.  I agree with the findings as above.  Slight cough and "wheezy" but no chest pain.   The patient exam reveals COR:RRR  ,  Lungs: Decreased breath sounds at the bases.  ,  Abd: .bowle, Ext No edema  .  All available labs, radiology testing, previous records reviewed. Agree with documented assessment and plan. No chest pain.  No further cardiac work up.  Continued pulmonary treatment.    Jeneen Rinks Job [place]  5:40 PM  12/17/2015

## 2015-12-17 NOTE — Care Management Note (Signed)
Case Management Note  Patient Details  Name: [name] MRN: [id number] Date of Birth: 1932/12/19  Subjective/Objective: Pt presented for chest pain. Pt is from home with support of family. Pt was previously active with [facility] for services. Plan will be to return home with services.                    Action/Plan: CM did call [facility] to make them aware that pt will be d/c 12-17-15. No further needs from CM at this time.   Expected Discharge Date:                  Expected Discharge Plan:  [place]  In-House Referral:  Clinical Social Work  Discharge planning Services  CM Consult  Post Acute Care Choice:  Home Health, Resumption of Svcs/PTA Provider Choice offered to:  Patient  DME Arranged:  N/A DME Agency:     HH Arranged:  RN, PT, OT HH Agency:  [facility]  Status of Service:  Completed, signed off  If discussed at [place] of Stay Meetings, dates discussed:    Additional Comments:  Bethena Roys, RN 12/17/2015, 9:52 AM

## 2015-12-18 DIAGNOSIS — R0789 Other chest pain: Secondary | ICD-10-CM

## 2015-12-18 DIAGNOSIS — J209 Acute bronchitis, unspecified: Secondary | ICD-10-CM

## 2015-12-18 MED ORDER — SACCHAROMYCES BOULARDII 250 MG PO CAPS: 250.0000 mg | ORAL_CAPSULE | Freq: Two times a day (BID) | ORAL | 0 refills | Status: DC

## 2015-12-18 MED ORDER — IPRATROPIUM-ALBUTEROL 0.5-2.5 (3) MG/3ML IN SOLN: 3.0000 mL | Freq: Four times a day (QID) | RESPIRATORY_TRACT | 0 refills | Status: DC

## 2015-12-18 MED ORDER — DOXYCYCLINE HYCLATE 100 MG PO TABS: 100.0000 mg | ORAL_TABLET | Freq: Two times a day (BID) | ORAL | 0 refills | Status: DC

## 2015-12-18 NOTE — Discharge Summary (Signed)
Physician Discharge Summary  [name] [id number] DOB: 1932/12/04 DOA: 12/15/2015  PCP: [name]  Admit date: 12/15/2015 Discharge date: 12/18/2015  Admitted From: Home Disposition:  home  Recommendations for Outpatient Follow-up:  1. Follow up with PCP in 1-2 weeks 2. Please obtain BMP/CBC in one week 3. Please follow up on the following pending results:  Home Health: resume [facility]  Discharge Condition ; stable.  CODE STATUS: full code/ Diet recommendation: Heart Healthy   Brief/Interim Summary: Brief Narrative: DorisLesteris a 82 y.o.female,with CAD, Pafib (Chads2vasc= 6), CHF (EF 60%), apparently presents with c/o chest pain. Under the left breast. Radiation towards the left shoulder. "similar to prior cardiac chest pain" per pt. " sharp" + cough, Dry. Has been having chest pain for the past 2 days. + dyspnea (stable), Denies fever, chills, n/v, heartburn, diarrhea, brbpr, black stool. The patient was having physical therapy, who called RN and pt was told to come to ER for evaluation.   In ED, EKG: NSR at 73, LAD, poor R progression, slight st depression in V4-6. Pt is presently cp free. Pt will be admitted for CP  Assessment & Plan:    1. Cp Trop I q6h x3 LDL 103.  S/P cath  showed widely patent previously placed Left Crx stent, with small nondominant RCA with 80% ostial stenosis. Medical management.  Chest pain free.   2. Acute bronchitis, pneumonitis.  Cough CTA chest 8/30=>negative for PE, Mild reticular nodular interstitial disease in the right upper lobe ? Infectious or inflammatory Doxycycline 100 mg for 5 more days  Allergy to prednisone ? Duo neb ordered. Improved with nebulizer, feels well enough to go home   3.Hyponatremia resolved   4. AKI Creatinine probably elevated due to recent Bactrim use.  Improved.   5. CHF (EF 55-605) Cont lasix, cont carvedilol  6. CAD (nuclear stress test 10/07/2015=> low  risk) Cont aspirin, cont carvedilol, cont imdur.  Pt states statin intolerant LDL 103  7. Gerd Cont protonix   Discharge Diagnoses:  Principal Problem:   Chest pain Active Problems:   Hyponatremia   Hyperglycemia   CAD in native artery   Acute bronchitis    Discharge Instructions  Discharge Instructions    Diet - low sodium heart healthy    Complete by:  As directed   Increase activity slowly    Complete by:  As directed       Medication List    STOP taking these medications   oxyCODONE-acetaminophen 5-325 MG tablet Commonly known as:  PERCOCET/ROXICET   sulfamethoxazole-trimethoprim 800-160 MG tablet Commonly known as:  BACTRIM DS,SEPTRA DS     TAKE these medications   acetaminophen 500 MG tablet Commonly known as:  TYLENOL Take 500 mg by mouth daily as needed for headache.   ALPRAZolam 0.25 MG tablet Commonly known as:  XANAX Take 0.25 mg by mouth 2 (two) times daily as needed for anxiety.   aspirin 81 MG EC tablet Take 81 mg by mouth every morning.   carvedilol 25 MG tablet Commonly known as:  COREG Take 12.5 mg by mouth 2 (two) times daily with a meal.   docusate sodium 100 MG capsule Commonly known as:  COLACE Take 100 mg by mouth daily as needed for mild constipation.   doxycycline 100 MG tablet Commonly known as:  VIBRA-TABS Take 1 tablet (100 mg total) by mouth every 12 (twelve) hours.   DULoxetine 60 MG capsule Commonly known as:  CYMBALTA Take 60 mg by mouth at bedtime.  fluticasone 50 MCG/ACT nasal spray Commonly known as:  FLONASE Place 2 sprays into both nostrils daily.   furosemide 20 MG tablet Commonly known as:  LASIX Take 1 tablet (20 mg total) by mouth daily.   gabapentin 300 MG capsule Commonly known as:  NEURONTIN Take 1 capsule (300 mg total) by mouth 3 (three) times daily.   ipratropium-albuterol 0.5-2.5 (3) MG/3ML Soln Commonly known as:  DUONEB Take 3 mLs by nebulization every 6 (six) hours.   isosorbide  mononitrate 120 MG 24 hr tablet Commonly known as:  IMDUR Take 120 mg by mouth every morning.   loratadine 10 MG tablet Commonly known as:  CLARITIN Take 10 mg by mouth every morning.   Melatonin 3 MG Caps Take 1 capsule by mouth at bedtime.   nitroGLYCERIN 0.4 MG SL tablet Commonly known as:  NITROSTAT Place 0.4 mg under the tongue every 5 (five) minutes as needed for chest pain. Reported on 08/04/2015   Oxycodone HCl 10 MG Tabs Take 10 mg by mouth daily as needed for pain.   pantoprazole 40 MG tablet Commonly known as:  PROTONIX Take 40 mg by mouth every morning.   potassium chloride 10 MEQ tablet Commonly known as:  K-DUR,KLOR-CON TAKE TWO (2) TABLETS BY MOUTH DAILY.   saccharomyces boulardii 250 MG capsule Commonly known as:  FLORASTOR Take 1 capsule (250 mg total) by mouth 2 (two) times daily.   VOLTAREN 1 % Gel Generic drug:  diclofenac sodium Apply 4 g topically daily as needed (for back pain).       Allergies  Allergen Reactions  . Cephalosporins Diarrhea and Nausea Only    Lightheaded  . Levaquin [Levofloxacin In D5w] Swelling  . Macrodantin [Nitrofurantoin Macrocrystal] Swelling  . Phenothiazines Anaphylaxis and Hives  . Polysorbate Anaphylaxis  . Prednisone Shortness Of Breath  . Buspirone Itching  . Cardura [Doxazosin Mesylate] Itching  . Codeine Itching  . Acyclovir And Related   . Prochlorperazine Other (See Comments)    unknown  . Ranexa [Ranolazine]     Severe drop in BP  . Atorvastatin Hives    Cramping; tolerates Crestor ok  . Ofloxacin Rash  . Other Itching and Rash    "WOOL"= make skin look like it has been burned  . Penicillins Other (See Comments)    Causes redness all over. Has patient had a PCN reaction causing immediate rash, facial/tongue/throat swelling, SOB or lightheadedness with hypotension: No Has patient had a PCN reaction causing severe rash involving mucus membranes or skin necrosis: No Has patient had a PCN reaction that  required hospitalization No Has patient had a PCN reaction occurring within the last 10 years: No If all of the above answers are "NO", then may proceed with Cephalosporin use.   . Pimozide Hives and Itching    Consultations:  Cardiology    Procedures/Studies: Dg Chest 2 View  Result Date: 12/15/2015 CLINICAL DATA:  Nonproductive cough since last night associated with sharp anterior chest discomfort today, no pleuritic component EXAM: CHEST  2 VIEW COMPARISON:  PA and lateral chest x-ray and chest CT scan of October 15, 2015 FINDINGS: The lungs are adequately inflated. The interstitial markings are coarse but stable. The heart is normal in size. The pulmonary vascularity is not engorged. There is calcification in the wall of the aortic arch. There is no pleural effusion. There are old left lateral rib deformities. IMPRESSION: Chronic bronchitic changes. No alveolar pneumonia nor CHF nor other acute cardiopulmonary abnormality. Aortic atherosclerosis. Electronically Signed  By: David  Martinique M.D.   On: 12/15/2015 16:15   Ct Angio Chest Pe W And/or Wo Contrast  Result Date: 12/15/2015 CLINICAL DATA:  Nonproductive cough since last night. Pain under the breast. EXAM: CT ANGIOGRAPHY CHEST WITH CONTRAST TECHNIQUE: Multidetector CT imaging of the chest was performed using the standard protocol during bolus administration of intravenous contrast. Multiplanar CT image reconstructions and MIPs were obtained to evaluate the vascular anatomy. CONTRAST:  100 mL Isovue 370 COMPARISON:  10/15/2015 FINDINGS: Mediastinum/Lymph Nodes: No pulmonary emboli or thoracic aortic dissection identified. Thoracic aortic atherosclerosis. Normal size heart size. Coronary artery atherosclerosis in the LAD, circumflex, left main and RCA. Enlarged subcarinal lymph node measuring 11 mm in short axis. Lungs/Pleura: No focal consolidation, pleural effusion or pneumothorax. Mild reticular nodular interstitial disease in the right  upper lobe. Bilateral patchy areas of ground-glass opacity. Upper abdomen: No acute findings. Musculoskeletal: No chest wall mass or suspicious bone lesions identified. Review of the MIP images confirms the above findings. IMPRESSION: 1. No pulmonary embolus. 2. No thoracic aortic dissection. 3. Mild reticular nodular interstitial disease in the right upper lobe which may be secondary to an infectious or inflammatory etiology. Electronically Signed   By: Kathreen Devoid   On: 12/15/2015 20:01       Subjective: Feeling better, denies chest pain, breathing better.   Discharge Exam: Vitals:   12/18/15 0737 12/18/15 0907  BP: 140/60 (!) 140/58  Pulse: 80 80  Resp: (!) 22 20  Temp: 98.1 F (36.7 C)    Vitals:   12/18/15 0218 12/18/15 0448 12/18/15 0737 12/18/15 0907  BP:  (!) 145/52 140/60 (!) 140/58  Pulse: 73 81 80 80  Resp: 16 16 (!) 22 20  Temp:  98 F (36.7 C) 98.1 F (36.7 C)   TempSrc:  Oral Oral   SpO2: 98% 94% 96% 96%  Weight:  79.7 kg (175 lb 12.8 oz)    Height:        General: Pt is alert, awake, not in acute distress Cardiovascular: RRR, S1/S2 +, no rubs, no gallops Respiratory: CTA bilaterally, no wheezing, no rhonchi Abdominal: Soft, NT, ND, bowel sounds + Extremities: no edema, no cyanosis    The results of significant diagnostics from this hospitalization (including imaging, microbiology, ancillary and laboratory) are listed below for reference.     Microbiology: No results found for this or any previous visit (from the past 240 hour(s)).   Labs: BNP (last 3 results)  Recent Labs  10/15/15 1247 12/15/15 1608  BNP 59.0 [id number]   Basic Metabolic Panel:  Recent Labs Lab 12/15/15 1608 12/16/15 0435 12/16/15 1931 12/17/15 0552  NA 133* 136  --  138  K 3.5 3.4*  --  3.7  CL 99* 104  --  104  CO2 24 23  --  24  GLUCOSE 149* 110*  --  106*  BUN 20 16  --  11  CREATININE 1.45* 1.41* 1.45* 1.30*  CALCIUM 9.3 8.7*  --  8.6*   Liver Function  Tests:  Recent Labs Lab 12/16/15 0435  AST 23  ALT 22  ALKPHOS 84  BILITOT 0.4  PROT 6.9  ALBUMIN 3.7   No results for input(s): LIPASE, AMYLASE in the last 168 hours. No results for input(s): AMMONIA in the last 168 hours. CBC:  Recent Labs Lab 12/15/15 1608 12/16/15 0435 12/16/15 1931 12/17/15 0552  WBC 7.1 6.1 5.5 5.5  NEUTROABS 3.6  --   --   --   HGB  10.7* 10.0* 9.3* 9.0*  HCT 32.5* 30.7* 29.1* 28.4*  MCV 83.5 85.3 85.8 85.5  PLT 327 328 264 249   Cardiac Enzymes:  Recent Labs Lab 12/15/15 1608 12/15/15 2255 12/16/15 0435  TROPONINI <0.03 <0.03 <0.03   BNP: Invalid input(s): POCBNP CBG: No results for input(s): GLUCAP in the last 168 hours. D-Dimer  Recent Labs  12/15/15 1608  DDIMER 1.89*   Hgb A1c No results for input(s): HGBA1C in the last 72 hours. Lipid Profile  Recent Labs  12/16/15 0435  CHOL 177  HDL 35*  LDLCALC 103*  TRIG 197*  CHOLHDL 5.1   Thyroid function studies No results for input(s): TSH, T4TOTAL, T3FREE, THYROIDAB in the last 72 hours.  Invalid input(s): FREET3 Anemia work up No results for input(s): VITAMINB12, FOLATE, FERRITIN, TIBC, IRON, RETICCTPCT in the last 72 hours. Urinalysis    Component Value Date/Time   COLORURINE YELLOW 10/27/2015 1345   APPEARANCEUR HAZY (A) 10/27/2015 1345   LABSPEC 1.010 10/27/2015 1345   PHURINE 5.5 10/27/2015 1345   GLUCOSEU NEGATIVE 10/27/2015 1345   HGBUR TRACE (A) 10/27/2015 1345   BILIRUBINUR NEGATIVE 10/27/2015 1345   KETONESUR NEGATIVE 10/27/2015 1345   PROTEINUR NEGATIVE 10/27/2015 1345   UROBILINOGEN 0.2 02/21/2015 2028   NITRITE NEGATIVE 10/27/2015 1345   LEUKOCYTESUR MODERATE (A) 10/27/2015 1345   Sepsis Labs Invalid input(s): PROCALCITONIN,  WBC,  LACTICIDVEN Microbiology No results found for this or any previous visit (from the past 240 hour(s)).   Time coordinating discharge: Over 30 minutes  SIGNED:   Elmarie Shiley, MD  [facility] 12/18/2015, 9:49 AM Pager [phone]  If 7PM-7AM, please contact night-coverage [email or web address] Password [email or web address]

## 2015-12-18 NOTE — Progress Notes (Signed)
CM received call from RN requesting [employer].  CM notified [facility] [employer]ME rep, Reggie to please deliver the Neb machine to the room so pt can discharge.  No other CM needs were communicated.

## 2015-12-19 DIAGNOSIS — I509 Heart failure, unspecified: Secondary | ICD-10-CM | POA: Diagnosis not present

## 2015-12-19 DIAGNOSIS — I2510 Atherosclerotic heart disease of native coronary artery without angina pectoris: Secondary | ICD-10-CM | POA: Diagnosis not present

## 2015-12-19 DIAGNOSIS — I110 Hypertensive heart disease with heart failure: Secondary | ICD-10-CM | POA: Diagnosis not present

## 2015-12-19 DIAGNOSIS — G970 Cerebrospinal fluid leak from spinal puncture: Secondary | ICD-10-CM | POA: Diagnosis not present

## 2015-12-19 DIAGNOSIS — I480 Paroxysmal atrial fibrillation: Secondary | ICD-10-CM | POA: Diagnosis not present

## 2015-12-19 DIAGNOSIS — G039 Meningitis, unspecified: Secondary | ICD-10-CM | POA: Diagnosis not present

## 2015-12-20 DIAGNOSIS — G970 Cerebrospinal fluid leak from spinal puncture: Secondary | ICD-10-CM | POA: Diagnosis not present

## 2015-12-20 DIAGNOSIS — I2510 Atherosclerotic heart disease of native coronary artery without angina pectoris: Secondary | ICD-10-CM | POA: Diagnosis not present

## 2015-12-20 DIAGNOSIS — I509 Heart failure, unspecified: Secondary | ICD-10-CM | POA: Diagnosis not present

## 2015-12-20 DIAGNOSIS — I480 Paroxysmal atrial fibrillation: Secondary | ICD-10-CM | POA: Diagnosis not present

## 2015-12-20 DIAGNOSIS — G039 Meningitis, unspecified: Secondary | ICD-10-CM | POA: Diagnosis not present

## 2015-12-20 DIAGNOSIS — I110 Hypertensive heart disease with heart failure: Secondary | ICD-10-CM | POA: Diagnosis not present

## 2015-12-22 DIAGNOSIS — I480 Paroxysmal atrial fibrillation: Secondary | ICD-10-CM | POA: Diagnosis not present

## 2015-12-22 DIAGNOSIS — I110 Hypertensive heart disease with heart failure: Secondary | ICD-10-CM | POA: Diagnosis not present

## 2015-12-22 DIAGNOSIS — G970 Cerebrospinal fluid leak from spinal puncture: Secondary | ICD-10-CM | POA: Diagnosis not present

## 2015-12-22 DIAGNOSIS — I2510 Atherosclerotic heart disease of native coronary artery without angina pectoris: Secondary | ICD-10-CM | POA: Diagnosis not present

## 2015-12-22 DIAGNOSIS — I509 Heart failure, unspecified: Secondary | ICD-10-CM | POA: Diagnosis not present

## 2015-12-22 DIAGNOSIS — G039 Meningitis, unspecified: Secondary | ICD-10-CM | POA: Diagnosis not present

## 2015-12-23 DIAGNOSIS — M533 Sacrococcygeal disorders, not elsewhere classified: Secondary | ICD-10-CM | POA: Diagnosis not present

## 2015-12-24 DIAGNOSIS — G039 Meningitis, unspecified: Secondary | ICD-10-CM | POA: Diagnosis not present

## 2015-12-24 DIAGNOSIS — I480 Paroxysmal atrial fibrillation: Secondary | ICD-10-CM | POA: Diagnosis not present

## 2015-12-24 DIAGNOSIS — I110 Hypertensive heart disease with heart failure: Secondary | ICD-10-CM | POA: Diagnosis not present

## 2015-12-24 DIAGNOSIS — I509 Heart failure, unspecified: Secondary | ICD-10-CM | POA: Diagnosis not present

## 2015-12-24 DIAGNOSIS — G970 Cerebrospinal fluid leak from spinal puncture: Secondary | ICD-10-CM | POA: Diagnosis not present

## 2015-12-24 DIAGNOSIS — I2510 Atherosclerotic heart disease of native coronary artery without angina pectoris: Secondary | ICD-10-CM | POA: Diagnosis not present

## 2015-12-26 DIAGNOSIS — G039 Meningitis, unspecified: Secondary | ICD-10-CM | POA: Diagnosis not present

## 2015-12-26 DIAGNOSIS — I509 Heart failure, unspecified: Secondary | ICD-10-CM | POA: Diagnosis not present

## 2015-12-26 DIAGNOSIS — I2510 Atherosclerotic heart disease of native coronary artery without angina pectoris: Secondary | ICD-10-CM | POA: Diagnosis not present

## 2015-12-26 DIAGNOSIS — I480 Paroxysmal atrial fibrillation: Secondary | ICD-10-CM | POA: Diagnosis not present

## 2015-12-26 DIAGNOSIS — G970 Cerebrospinal fluid leak from spinal puncture: Secondary | ICD-10-CM | POA: Diagnosis not present

## 2015-12-26 DIAGNOSIS — I110 Hypertensive heart disease with heart failure: Secondary | ICD-10-CM | POA: Diagnosis not present

## 2015-12-27 DIAGNOSIS — D649 Anemia, unspecified: Secondary | ICD-10-CM | POA: Diagnosis not present

## 2015-12-27 DIAGNOSIS — E871 Hypo-osmolality and hyponatremia: Secondary | ICD-10-CM | POA: Diagnosis not present

## 2015-12-27 DIAGNOSIS — J440 Chronic obstructive pulmonary disease with acute lower respiratory infection: Secondary | ICD-10-CM | POA: Diagnosis not present

## 2015-12-27 DIAGNOSIS — Z6832 Body mass index (BMI) 32.0-32.9, adult: Secondary | ICD-10-CM | POA: Diagnosis not present

## 2015-12-27 DIAGNOSIS — I5030 Unspecified diastolic (congestive) heart failure: Secondary | ICD-10-CM | POA: Diagnosis not present

## 2015-12-27 DIAGNOSIS — M79672 Pain in left foot: Secondary | ICD-10-CM | POA: Diagnosis not present

## 2015-12-27 DIAGNOSIS — D519 Vitamin B12 deficiency anemia, unspecified: Secondary | ICD-10-CM | POA: Diagnosis not present

## 2015-12-27 DIAGNOSIS — R079 Chest pain, unspecified: Secondary | ICD-10-CM | POA: Diagnosis not present

## 2015-12-27 DIAGNOSIS — M7062 Trochanteric bursitis, left hip: Secondary | ICD-10-CM | POA: Diagnosis not present

## 2015-12-28 DIAGNOSIS — I509 Heart failure, unspecified: Secondary | ICD-10-CM | POA: Diagnosis not present

## 2015-12-28 DIAGNOSIS — I480 Paroxysmal atrial fibrillation: Secondary | ICD-10-CM | POA: Diagnosis not present

## 2015-12-28 DIAGNOSIS — G970 Cerebrospinal fluid leak from spinal puncture: Secondary | ICD-10-CM | POA: Diagnosis not present

## 2015-12-28 DIAGNOSIS — G039 Meningitis, unspecified: Secondary | ICD-10-CM | POA: Diagnosis not present

## 2015-12-28 DIAGNOSIS — I110 Hypertensive heart disease with heart failure: Secondary | ICD-10-CM | POA: Diagnosis not present

## 2015-12-28 DIAGNOSIS — I2510 Atherosclerotic heart disease of native coronary artery without angina pectoris: Secondary | ICD-10-CM | POA: Diagnosis not present

## 2015-12-29 DIAGNOSIS — H25011 Cortical age-related cataract, right eye: Secondary | ICD-10-CM | POA: Diagnosis not present

## 2015-12-29 DIAGNOSIS — H2511 Age-related nuclear cataract, right eye: Secondary | ICD-10-CM | POA: Diagnosis not present

## 2015-12-30 DIAGNOSIS — M4316 Spondylolisthesis, lumbar region: Secondary | ICD-10-CM | POA: Diagnosis not present

## 2015-12-30 DIAGNOSIS — Z6833 Body mass index (BMI) 33.0-33.9, adult: Secondary | ICD-10-CM | POA: Diagnosis not present

## 2015-12-31 DIAGNOSIS — I480 Paroxysmal atrial fibrillation: Secondary | ICD-10-CM | POA: Diagnosis not present

## 2015-12-31 DIAGNOSIS — I2510 Atherosclerotic heart disease of native coronary artery without angina pectoris: Secondary | ICD-10-CM | POA: Diagnosis not present

## 2015-12-31 DIAGNOSIS — G039 Meningitis, unspecified: Secondary | ICD-10-CM | POA: Diagnosis not present

## 2015-12-31 DIAGNOSIS — I509 Heart failure, unspecified: Secondary | ICD-10-CM | POA: Diagnosis not present

## 2015-12-31 DIAGNOSIS — I110 Hypertensive heart disease with heart failure: Secondary | ICD-10-CM | POA: Diagnosis not present

## 2015-12-31 DIAGNOSIS — G970 Cerebrospinal fluid leak from spinal puncture: Secondary | ICD-10-CM | POA: Diagnosis not present

## 2016-01-02 DIAGNOSIS — I110 Hypertensive heart disease with heart failure: Secondary | ICD-10-CM | POA: Diagnosis not present

## 2016-01-02 DIAGNOSIS — F341 Dysthymic disorder: Secondary | ICD-10-CM | POA: Diagnosis not present

## 2016-01-02 DIAGNOSIS — I480 Paroxysmal atrial fibrillation: Secondary | ICD-10-CM | POA: Diagnosis not present

## 2016-01-02 DIAGNOSIS — G970 Cerebrospinal fluid leak from spinal puncture: Secondary | ICD-10-CM | POA: Diagnosis not present

## 2016-01-02 DIAGNOSIS — R1310 Dysphagia, unspecified: Secondary | ICD-10-CM | POA: Diagnosis not present

## 2016-01-02 DIAGNOSIS — I472 Ventricular tachycardia: Secondary | ICD-10-CM | POA: Diagnosis not present

## 2016-01-02 DIAGNOSIS — E785 Hyperlipidemia, unspecified: Secondary | ICD-10-CM | POA: Diagnosis not present

## 2016-01-02 DIAGNOSIS — E079 Disorder of thyroid, unspecified: Secondary | ICD-10-CM | POA: Diagnosis not present

## 2016-01-02 DIAGNOSIS — I252 Old myocardial infarction: Secondary | ICD-10-CM | POA: Diagnosis not present

## 2016-01-02 DIAGNOSIS — G039 Meningitis, unspecified: Secondary | ICD-10-CM | POA: Diagnosis not present

## 2016-01-02 DIAGNOSIS — I2510 Atherosclerotic heart disease of native coronary artery without angina pectoris: Secondary | ICD-10-CM | POA: Diagnosis not present

## 2016-01-02 DIAGNOSIS — I509 Heart failure, unspecified: Secondary | ICD-10-CM | POA: Diagnosis not present

## 2016-01-03 DIAGNOSIS — G970 Cerebrospinal fluid leak from spinal puncture: Secondary | ICD-10-CM | POA: Diagnosis not present

## 2016-01-03 DIAGNOSIS — I509 Heart failure, unspecified: Secondary | ICD-10-CM | POA: Diagnosis not present

## 2016-01-03 DIAGNOSIS — I110 Hypertensive heart disease with heart failure: Secondary | ICD-10-CM | POA: Diagnosis not present

## 2016-01-03 DIAGNOSIS — I480 Paroxysmal atrial fibrillation: Secondary | ICD-10-CM | POA: Diagnosis not present

## 2016-01-03 DIAGNOSIS — I2510 Atherosclerotic heart disease of native coronary artery without angina pectoris: Secondary | ICD-10-CM | POA: Diagnosis not present

## 2016-01-03 DIAGNOSIS — G039 Meningitis, unspecified: Secondary | ICD-10-CM | POA: Diagnosis not present

## 2016-01-04 DIAGNOSIS — H25811 Combined forms of age-related cataract, right eye: Secondary | ICD-10-CM | POA: Diagnosis not present

## 2016-01-04 DIAGNOSIS — H2511 Age-related nuclear cataract, right eye: Secondary | ICD-10-CM | POA: Diagnosis not present

## 2016-01-05 DIAGNOSIS — G970 Cerebrospinal fluid leak from spinal puncture: Secondary | ICD-10-CM | POA: Diagnosis not present

## 2016-01-05 DIAGNOSIS — I110 Hypertensive heart disease with heart failure: Secondary | ICD-10-CM | POA: Diagnosis not present

## 2016-01-05 DIAGNOSIS — I2510 Atherosclerotic heart disease of native coronary artery without angina pectoris: Secondary | ICD-10-CM | POA: Diagnosis not present

## 2016-01-05 DIAGNOSIS — G039 Meningitis, unspecified: Secondary | ICD-10-CM | POA: Diagnosis not present

## 2016-01-05 DIAGNOSIS — I480 Paroxysmal atrial fibrillation: Secondary | ICD-10-CM | POA: Diagnosis not present

## 2016-01-05 DIAGNOSIS — I509 Heart failure, unspecified: Secondary | ICD-10-CM | POA: Diagnosis not present

## 2016-01-06 DIAGNOSIS — H20051 Hypopyon, right eye: Secondary | ICD-10-CM | POA: Diagnosis not present

## 2016-01-06 DIAGNOSIS — Z961 Presence of intraocular lens: Secondary | ICD-10-CM | POA: Diagnosis not present

## 2016-01-06 DIAGNOSIS — H44009 Unspecified purulent endophthalmitis, unspecified eye: Secondary | ICD-10-CM | POA: Diagnosis not present

## 2016-01-06 DIAGNOSIS — H44001 Unspecified purulent endophthalmitis, right eye: Secondary | ICD-10-CM | POA: Diagnosis not present

## 2016-01-10 DIAGNOSIS — I110 Hypertensive heart disease with heart failure: Secondary | ICD-10-CM | POA: Diagnosis not present

## 2016-01-10 DIAGNOSIS — I2510 Atherosclerotic heart disease of native coronary artery without angina pectoris: Secondary | ICD-10-CM | POA: Diagnosis not present

## 2016-01-10 DIAGNOSIS — G039 Meningitis, unspecified: Secondary | ICD-10-CM | POA: Diagnosis not present

## 2016-01-10 DIAGNOSIS — I480 Paroxysmal atrial fibrillation: Secondary | ICD-10-CM | POA: Diagnosis not present

## 2016-01-10 DIAGNOSIS — G970 Cerebrospinal fluid leak from spinal puncture: Secondary | ICD-10-CM | POA: Diagnosis not present

## 2016-01-10 DIAGNOSIS — I509 Heart failure, unspecified: Secondary | ICD-10-CM | POA: Diagnosis not present

## 2016-01-12 DIAGNOSIS — I480 Paroxysmal atrial fibrillation: Secondary | ICD-10-CM | POA: Diagnosis not present

## 2016-01-12 DIAGNOSIS — G039 Meningitis, unspecified: Secondary | ICD-10-CM | POA: Diagnosis not present

## 2016-01-12 DIAGNOSIS — I110 Hypertensive heart disease with heart failure: Secondary | ICD-10-CM | POA: Diagnosis not present

## 2016-01-12 DIAGNOSIS — G970 Cerebrospinal fluid leak from spinal puncture: Secondary | ICD-10-CM | POA: Diagnosis not present

## 2016-01-12 DIAGNOSIS — I2510 Atherosclerotic heart disease of native coronary artery without angina pectoris: Secondary | ICD-10-CM | POA: Diagnosis not present

## 2016-01-12 DIAGNOSIS — I509 Heart failure, unspecified: Secondary | ICD-10-CM | POA: Diagnosis not present

## 2016-01-13 DIAGNOSIS — G970 Cerebrospinal fluid leak from spinal puncture: Secondary | ICD-10-CM | POA: Diagnosis not present

## 2016-01-13 DIAGNOSIS — G039 Meningitis, unspecified: Secondary | ICD-10-CM | POA: Diagnosis not present

## 2016-01-13 DIAGNOSIS — I480 Paroxysmal atrial fibrillation: Secondary | ICD-10-CM | POA: Diagnosis not present

## 2016-01-13 DIAGNOSIS — I110 Hypertensive heart disease with heart failure: Secondary | ICD-10-CM | POA: Diagnosis not present

## 2016-01-13 DIAGNOSIS — I509 Heart failure, unspecified: Secondary | ICD-10-CM | POA: Diagnosis not present

## 2016-01-13 DIAGNOSIS — I2510 Atherosclerotic heart disease of native coronary artery without angina pectoris: Secondary | ICD-10-CM | POA: Diagnosis not present

## 2016-01-17 ENCOUNTER — Telehealth: Payer: Self-pay

## 2016-01-17 DIAGNOSIS — J440 Chronic obstructive pulmonary disease with acute lower respiratory infection: Secondary | ICD-10-CM | POA: Diagnosis not present

## 2016-01-17 DIAGNOSIS — E871 Hypo-osmolality and hyponatremia: Secondary | ICD-10-CM | POA: Diagnosis not present

## 2016-01-17 DIAGNOSIS — A09 Infectious gastroenteritis and colitis, unspecified: Secondary | ICD-10-CM | POA: Diagnosis not present

## 2016-01-17 DIAGNOSIS — R197 Diarrhea, unspecified: Secondary | ICD-10-CM

## 2016-01-17 DIAGNOSIS — R079 Chest pain, unspecified: Secondary | ICD-10-CM | POA: Diagnosis not present

## 2016-01-17 DIAGNOSIS — M79672 Pain in left foot: Secondary | ICD-10-CM | POA: Diagnosis not present

## 2016-01-17 DIAGNOSIS — I5030 Unspecified diastolic (congestive) heart failure: Secondary | ICD-10-CM | POA: Diagnosis not present

## 2016-01-17 DIAGNOSIS — M7062 Trochanteric bursitis, left hip: Secondary | ICD-10-CM | POA: Diagnosis not present

## 2016-01-17 DIAGNOSIS — Z6832 Body mass index (BMI) 32.0-32.9, adult: Secondary | ICD-10-CM | POA: Diagnosis not present

## 2016-01-17 NOTE — Telephone Encounter (Signed)
-->  Need to check for C.diff as patient has been in hospital and nursing home over the past couple of months. Please have her collect stool specimen and return asap.  -->She needs to drink enough fluid to keep her urine light yellow.  -->Please find out how many times per day she is having a BM.

## 2016-01-17 NOTE — Telephone Encounter (Signed)
Pt is calling because she is having diarrhea(for 5 days). She had tried taking OTC medication and nothing is working.

## 2016-01-17 NOTE — Telephone Encounter (Signed)
Pt got out of [facility] in Aug. She has been to the bathroom 8 times so far and it is diarrhea. She is having to change her clothes sometimes. Daughter is going to come by to pick up C-Diff container.

## 2016-01-18 DIAGNOSIS — G970 Cerebrospinal fluid leak from spinal puncture: Secondary | ICD-10-CM | POA: Diagnosis not present

## 2016-01-18 DIAGNOSIS — I110 Hypertensive heart disease with heart failure: Secondary | ICD-10-CM | POA: Diagnosis not present

## 2016-01-18 DIAGNOSIS — G039 Meningitis, unspecified: Secondary | ICD-10-CM | POA: Diagnosis not present

## 2016-01-18 DIAGNOSIS — I480 Paroxysmal atrial fibrillation: Secondary | ICD-10-CM | POA: Diagnosis not present

## 2016-01-18 DIAGNOSIS — I509 Heart failure, unspecified: Secondary | ICD-10-CM | POA: Diagnosis not present

## 2016-01-18 DIAGNOSIS — I2510 Atherosclerotic heart disease of native coronary artery without angina pectoris: Secondary | ICD-10-CM | POA: Diagnosis not present

## 2016-01-18 NOTE — Telephone Encounter (Signed)
Noted. Instructions as previously given.

## 2016-01-19 ENCOUNTER — Telehealth: Payer: Self-pay | Admitting: Internal Medicine

## 2016-01-19 DIAGNOSIS — I110 Hypertensive heart disease with heart failure: Secondary | ICD-10-CM | POA: Diagnosis not present

## 2016-01-19 DIAGNOSIS — I480 Paroxysmal atrial fibrillation: Secondary | ICD-10-CM | POA: Diagnosis not present

## 2016-01-19 DIAGNOSIS — I509 Heart failure, unspecified: Secondary | ICD-10-CM | POA: Diagnosis not present

## 2016-01-19 DIAGNOSIS — G039 Meningitis, unspecified: Secondary | ICD-10-CM | POA: Diagnosis not present

## 2016-01-19 DIAGNOSIS — I2510 Atherosclerotic heart disease of native coronary artery without angina pectoris: Secondary | ICD-10-CM | POA: Diagnosis not present

## 2016-01-19 DIAGNOSIS — G970 Cerebrospinal fluid leak from spinal puncture: Secondary | ICD-10-CM | POA: Diagnosis not present

## 2016-01-19 LAB — CLOSTRIDIUM DIFFICILE BY PCR: Toxigenic C. Difficile by PCR: NOT DETECTED

## 2016-01-19 NOTE — Telephone Encounter (Signed)
Routing to [facility] 

## 2016-01-19 NOTE — Telephone Encounter (Signed)
Pt's daughter called to check on the stool results while the home heallth nurse was still there at the patient's house. I told her that our nurse was with patients at the moment and I could transfer her to VM and the nurse would have to call her back. Call transferred.

## 2016-01-20 DIAGNOSIS — R300 Dysuria: Secondary | ICD-10-CM | POA: Diagnosis not present

## 2016-01-20 DIAGNOSIS — R197 Diarrhea, unspecified: Secondary | ICD-10-CM | POA: Diagnosis not present

## 2016-01-20 DIAGNOSIS — Z6831 Body mass index (BMI) 31.0-31.9, adult: Secondary | ICD-10-CM | POA: Diagnosis not present

## 2016-01-20 DIAGNOSIS — S80811A Abrasion, right lower leg, initial encounter: Secondary | ICD-10-CM | POA: Diagnosis not present

## 2016-01-20 NOTE — Telephone Encounter (Signed)
Results came back yesterday afternoon. Please see result note.

## 2016-01-20 NOTE — Telephone Encounter (Signed)
Please let patient know cdiff negative. Find out plans from PCP, patient seen today. I don't want to duplicate anything they are doing.  Also offer her ov here.

## 2016-01-20 NOTE — Telephone Encounter (Signed)
Katie from [facility] called- the pt is being seen in their office and they are asking for the cdiff results. I informed her they were negative and faxed a copy of the labs to [phone].

## 2016-01-24 DIAGNOSIS — G970 Cerebrospinal fluid leak from spinal puncture: Secondary | ICD-10-CM | POA: Diagnosis not present

## 2016-01-24 DIAGNOSIS — I480 Paroxysmal atrial fibrillation: Secondary | ICD-10-CM | POA: Diagnosis not present

## 2016-01-24 DIAGNOSIS — I2510 Atherosclerotic heart disease of native coronary artery without angina pectoris: Secondary | ICD-10-CM | POA: Diagnosis not present

## 2016-01-24 DIAGNOSIS — I509 Heart failure, unspecified: Secondary | ICD-10-CM | POA: Diagnosis not present

## 2016-01-24 DIAGNOSIS — I110 Hypertensive heart disease with heart failure: Secondary | ICD-10-CM | POA: Diagnosis not present

## 2016-01-24 DIAGNOSIS — G039 Meningitis, unspecified: Secondary | ICD-10-CM | POA: Diagnosis not present

## 2016-01-26 DIAGNOSIS — Z6832 Body mass index (BMI) 32.0-32.9, adult: Secondary | ICD-10-CM | POA: Diagnosis not present

## 2016-01-26 DIAGNOSIS — I5030 Unspecified diastolic (congestive) heart failure: Secondary | ICD-10-CM | POA: Diagnosis not present

## 2016-01-26 DIAGNOSIS — R300 Dysuria: Secondary | ICD-10-CM | POA: Diagnosis not present

## 2016-01-26 DIAGNOSIS — J440 Chronic obstructive pulmonary disease with acute lower respiratory infection: Secondary | ICD-10-CM | POA: Diagnosis not present

## 2016-01-26 DIAGNOSIS — R1084 Generalized abdominal pain: Secondary | ICD-10-CM | POA: Diagnosis not present

## 2016-01-26 DIAGNOSIS — I2510 Atherosclerotic heart disease of native coronary artery without angina pectoris: Secondary | ICD-10-CM | POA: Diagnosis not present

## 2016-01-26 DIAGNOSIS — I110 Hypertensive heart disease with heart failure: Secondary | ICD-10-CM | POA: Diagnosis not present

## 2016-01-26 DIAGNOSIS — G970 Cerebrospinal fluid leak from spinal puncture: Secondary | ICD-10-CM | POA: Diagnosis not present

## 2016-01-26 DIAGNOSIS — R531 Weakness: Secondary | ICD-10-CM | POA: Diagnosis not present

## 2016-01-26 DIAGNOSIS — B373 Candidiasis of vulva and vagina: Secondary | ICD-10-CM | POA: Diagnosis not present

## 2016-01-26 DIAGNOSIS — I480 Paroxysmal atrial fibrillation: Secondary | ICD-10-CM | POA: Diagnosis not present

## 2016-01-26 DIAGNOSIS — G039 Meningitis, unspecified: Secondary | ICD-10-CM | POA: Diagnosis not present

## 2016-01-26 DIAGNOSIS — R197 Diarrhea, unspecified: Secondary | ICD-10-CM | POA: Diagnosis not present

## 2016-01-26 DIAGNOSIS — I509 Heart failure, unspecified: Secondary | ICD-10-CM | POA: Diagnosis not present

## 2016-01-27 DIAGNOSIS — I509 Heart failure, unspecified: Secondary | ICD-10-CM | POA: Diagnosis not present

## 2016-01-27 DIAGNOSIS — R531 Weakness: Secondary | ICD-10-CM | POA: Diagnosis not present

## 2016-01-27 DIAGNOSIS — R197 Diarrhea, unspecified: Secondary | ICD-10-CM | POA: Diagnosis not present

## 2016-01-28 DIAGNOSIS — I509 Heart failure, unspecified: Secondary | ICD-10-CM | POA: Diagnosis not present

## 2016-01-28 DIAGNOSIS — G970 Cerebrospinal fluid leak from spinal puncture: Secondary | ICD-10-CM | POA: Diagnosis not present

## 2016-01-28 DIAGNOSIS — I480 Paroxysmal atrial fibrillation: Secondary | ICD-10-CM | POA: Diagnosis not present

## 2016-01-28 DIAGNOSIS — I2510 Atherosclerotic heart disease of native coronary artery without angina pectoris: Secondary | ICD-10-CM | POA: Diagnosis not present

## 2016-01-28 DIAGNOSIS — G039 Meningitis, unspecified: Secondary | ICD-10-CM | POA: Diagnosis not present

## 2016-01-28 DIAGNOSIS — I110 Hypertensive heart disease with heart failure: Secondary | ICD-10-CM | POA: Diagnosis not present

## 2016-01-31 DIAGNOSIS — R1084 Generalized abdominal pain: Secondary | ICD-10-CM | POA: Diagnosis not present

## 2016-01-31 DIAGNOSIS — N3000 Acute cystitis without hematuria: Secondary | ICD-10-CM | POA: Diagnosis not present

## 2016-01-31 DIAGNOSIS — A0472 Enterocolitis due to Clostridium difficile, not specified as recurrent: Secondary | ICD-10-CM | POA: Diagnosis not present

## 2016-01-31 DIAGNOSIS — Z6830 Body mass index (BMI) 30.0-30.9, adult: Secondary | ICD-10-CM | POA: Diagnosis not present

## 2016-02-02 DIAGNOSIS — G970 Cerebrospinal fluid leak from spinal puncture: Secondary | ICD-10-CM | POA: Diagnosis not present

## 2016-02-02 DIAGNOSIS — I2510 Atherosclerotic heart disease of native coronary artery without angina pectoris: Secondary | ICD-10-CM | POA: Diagnosis not present

## 2016-02-02 DIAGNOSIS — G039 Meningitis, unspecified: Secondary | ICD-10-CM | POA: Diagnosis not present

## 2016-02-02 DIAGNOSIS — I509 Heart failure, unspecified: Secondary | ICD-10-CM | POA: Diagnosis not present

## 2016-02-02 DIAGNOSIS — I480 Paroxysmal atrial fibrillation: Secondary | ICD-10-CM | POA: Diagnosis not present

## 2016-02-02 DIAGNOSIS — I110 Hypertensive heart disease with heart failure: Secondary | ICD-10-CM | POA: Diagnosis not present

## 2016-02-04 DIAGNOSIS — J440 Chronic obstructive pulmonary disease with acute lower respiratory infection: Secondary | ICD-10-CM | POA: Diagnosis not present

## 2016-02-04 DIAGNOSIS — Z6830 Body mass index (BMI) 30.0-30.9, adult: Secondary | ICD-10-CM | POA: Diagnosis not present

## 2016-02-04 DIAGNOSIS — R300 Dysuria: Secondary | ICD-10-CM | POA: Diagnosis not present

## 2016-02-04 DIAGNOSIS — R197 Diarrhea, unspecified: Secondary | ICD-10-CM | POA: Diagnosis not present

## 2016-02-07 DIAGNOSIS — I509 Heart failure, unspecified: Secondary | ICD-10-CM | POA: Diagnosis not present

## 2016-02-07 DIAGNOSIS — G039 Meningitis, unspecified: Secondary | ICD-10-CM | POA: Diagnosis not present

## 2016-02-07 DIAGNOSIS — I480 Paroxysmal atrial fibrillation: Secondary | ICD-10-CM | POA: Diagnosis not present

## 2016-02-07 DIAGNOSIS — G970 Cerebrospinal fluid leak from spinal puncture: Secondary | ICD-10-CM | POA: Diagnosis not present

## 2016-02-07 DIAGNOSIS — I2510 Atherosclerotic heart disease of native coronary artery without angina pectoris: Secondary | ICD-10-CM | POA: Diagnosis not present

## 2016-02-07 DIAGNOSIS — I110 Hypertensive heart disease with heart failure: Secondary | ICD-10-CM | POA: Diagnosis not present

## 2016-02-15 ENCOUNTER — Ambulatory Visit: Payer: Medicare Other | Admitting: Gastroenterology

## 2016-02-16 ENCOUNTER — Encounter (HOSPITAL_COMMUNITY): Payer: Self-pay

## 2016-02-16 ENCOUNTER — Emergency Department (HOSPITAL_COMMUNITY): Payer: Medicare Other

## 2016-02-16 ENCOUNTER — Inpatient Hospital Stay (HOSPITAL_COMMUNITY)
Admission: EM | Admit: 2016-02-16 | Discharge: 2016-02-20 | DRG: 291 | Disposition: A | Discharge status: Home Health | Payer: Medicare Other | Attending: Internal Medicine | Admitting: Internal Medicine

## 2016-02-16 DIAGNOSIS — Z833 Family history of diabetes mellitus: Secondary | ICD-10-CM | POA: Diagnosis not present

## 2016-02-16 DIAGNOSIS — Z96651 Presence of right artificial knee joint: Secondary | ICD-10-CM | POA: Diagnosis present

## 2016-02-16 DIAGNOSIS — M6281 Muscle weakness (generalized): Secondary | ICD-10-CM

## 2016-02-16 DIAGNOSIS — I5033 Acute on chronic diastolic (congestive) heart failure: Secondary | ICD-10-CM | POA: Diagnosis not present

## 2016-02-16 DIAGNOSIS — I2510 Atherosclerotic heart disease of native coronary artery without angina pectoris: Secondary | ICD-10-CM | POA: Diagnosis present

## 2016-02-16 DIAGNOSIS — J441 Chronic obstructive pulmonary disease with (acute) exacerbation: Secondary | ICD-10-CM | POA: Diagnosis present

## 2016-02-16 DIAGNOSIS — I480 Paroxysmal atrial fibrillation: Secondary | ICD-10-CM | POA: Diagnosis present

## 2016-02-16 DIAGNOSIS — I509 Heart failure, unspecified: Secondary | ICD-10-CM

## 2016-02-16 DIAGNOSIS — Z820 Family history of epilepsy and other diseases of the nervous system: Secondary | ICD-10-CM | POA: Diagnosis not present

## 2016-02-16 DIAGNOSIS — D473 Essential (hemorrhagic) thrombocythemia: Secondary | ICD-10-CM | POA: Diagnosis present

## 2016-02-16 DIAGNOSIS — Z823 Family history of stroke: Secondary | ICD-10-CM

## 2016-02-16 DIAGNOSIS — G8929 Other chronic pain: Secondary | ICD-10-CM | POA: Diagnosis present

## 2016-02-16 DIAGNOSIS — Z955 Presence of coronary angioplasty implant and graft: Secondary | ICD-10-CM | POA: Diagnosis not present

## 2016-02-16 DIAGNOSIS — N183 Chronic kidney disease, stage 3 unspecified: Secondary | ICD-10-CM | POA: Diagnosis present

## 2016-02-16 DIAGNOSIS — I10 Essential (primary) hypertension: Secondary | ICD-10-CM | POA: Diagnosis not present

## 2016-02-16 DIAGNOSIS — Z888 Allergy status to other drugs, medicaments and biological substances status: Secondary | ICD-10-CM

## 2016-02-16 DIAGNOSIS — Z87891 Personal history of nicotine dependence: Secondary | ICD-10-CM

## 2016-02-16 DIAGNOSIS — M25519 Pain in unspecified shoulder: Secondary | ICD-10-CM

## 2016-02-16 DIAGNOSIS — I4891 Unspecified atrial fibrillation: Secondary | ICD-10-CM

## 2016-02-16 DIAGNOSIS — Z7982 Long term (current) use of aspirin: Secondary | ICD-10-CM | POA: Diagnosis not present

## 2016-02-16 DIAGNOSIS — I130 Hypertensive heart and chronic kidney disease with heart failure and stage 1 through stage 4 chronic kidney disease, or unspecified chronic kidney disease: Principal | ICD-10-CM | POA: Diagnosis present

## 2016-02-16 DIAGNOSIS — R0602 Shortness of breath: Secondary | ICD-10-CM | POA: Diagnosis not present

## 2016-02-16 DIAGNOSIS — Z7951 Long term (current) use of inhaled steroids: Secondary | ICD-10-CM | POA: Diagnosis not present

## 2016-02-16 DIAGNOSIS — F410 Panic disorder [episodic paroxysmal anxiety] without agoraphobia: Secondary | ICD-10-CM | POA: Diagnosis present

## 2016-02-16 DIAGNOSIS — K219 Gastro-esophageal reflux disease without esophagitis: Secondary | ICD-10-CM | POA: Diagnosis present

## 2016-02-16 DIAGNOSIS — D75839 Thrombocytosis, unspecified: Secondary | ICD-10-CM | POA: Diagnosis present

## 2016-02-16 DIAGNOSIS — D649 Anemia, unspecified: Secondary | ICD-10-CM | POA: Diagnosis present

## 2016-02-16 DIAGNOSIS — M7551 Bursitis of right shoulder: Secondary | ICD-10-CM | POA: Diagnosis present

## 2016-02-16 DIAGNOSIS — F329 Major depressive disorder, single episode, unspecified: Secondary | ICD-10-CM | POA: Diagnosis present

## 2016-02-16 DIAGNOSIS — R000 Tachycardia, unspecified: Secondary | ICD-10-CM | POA: Diagnosis not present

## 2016-02-16 DIAGNOSIS — Z8249 Family history of ischemic heart disease and other diseases of the circulatory system: Secondary | ICD-10-CM

## 2016-02-16 DIAGNOSIS — E785 Hyperlipidemia, unspecified: Secondary | ICD-10-CM | POA: Diagnosis present

## 2016-02-16 DIAGNOSIS — Z9049 Acquired absence of other specified parts of digestive tract: Secondary | ICD-10-CM

## 2016-02-16 DIAGNOSIS — M19011 Primary osteoarthritis, right shoulder: Secondary | ICD-10-CM | POA: Diagnosis not present

## 2016-02-16 DIAGNOSIS — I252 Old myocardial infarction: Secondary | ICD-10-CM

## 2016-02-16 DIAGNOSIS — I110 Hypertensive heart disease with heart failure: Secondary | ICD-10-CM | POA: Diagnosis not present

## 2016-02-16 DIAGNOSIS — J449 Chronic obstructive pulmonary disease, unspecified: Secondary | ICD-10-CM | POA: Diagnosis present

## 2016-02-16 LAB — CBC WITH DIFFERENTIAL/PLATELET
Basophils Absolute: 0 10*3/uL (ref 0.0–0.1)
Basophils Relative: 1 %
Eosinophils Absolute: 0.2 10*3/uL (ref 0.0–0.7)
Eosinophils Relative: 3 %
HEMATOCRIT: 30.5 % — AB (ref 36.0–46.0)
HEMOGLOBIN: 9.6 g/dL — AB (ref 12.0–15.0)
LYMPHS ABS: 2.1 10*3/uL (ref 0.7–4.0)
Lymphocytes Relative: 28 %
MCH: 25.7 pg — AB (ref 26.0–34.0)
MCHC: 31.5 g/dL (ref 30.0–36.0)
MCV: 81.8 fL (ref 78.0–100.0)
MONOS PCT: 7 %
Monocytes Absolute: 0.5 10*3/uL (ref 0.1–1.0)
NEUTROS ABS: 4.6 10*3/uL (ref 1.7–7.7)
NEUTROS PCT: 61 %
Platelets: 430 10*3/uL — ABNORMAL HIGH (ref 150–400)
RBC: 3.73 MIL/uL — ABNORMAL LOW (ref 3.87–5.11)
RDW: 16.4 % — ABNORMAL HIGH (ref 11.5–15.5)
WBC: 7.5 10*3/uL (ref 4.0–10.5)

## 2016-02-16 LAB — BRAIN NATRIURETIC PEPTIDE: B Natriuretic Peptide: 483 pg/mL — ABNORMAL HIGH (ref 0.0–100.0)

## 2016-02-16 LAB — RAPID URINE DRUG SCREEN, HOSP PERFORMED
Amphetamines: NOT DETECTED
BARBITURATES: NOT DETECTED
BENZODIAZEPINES: NOT DETECTED
COCAINE: NOT DETECTED
Opiates: NOT DETECTED
TETRAHYDROCANNABINOL: NOT DETECTED

## 2016-02-16 LAB — MAGNESIUM: MAGNESIUM: 1.8 mg/dL (ref 1.7–2.4)

## 2016-02-16 LAB — COMPREHENSIVE METABOLIC PANEL
ALK PHOS: 66 U/L (ref 38–126)
ALT: 13 U/L — ABNORMAL LOW (ref 14–54)
ANION GAP: 9 (ref 5–15)
AST: 23 U/L (ref 15–41)
Albumin: 3.8 g/dL (ref 3.5–5.0)
BILIRUBIN TOTAL: 0.5 mg/dL (ref 0.3–1.2)
BUN: 16 mg/dL (ref 6–20)
CALCIUM: 8.7 mg/dL — AB (ref 8.9–10.3)
CO2: 24 mmol/L (ref 22–32)
Chloride: 103 mmol/L (ref 101–111)
Creatinine, Ser: 1.28 mg/dL — ABNORMAL HIGH (ref 0.44–1.00)
GFR calc non Af Amer: 38 mL/min — ABNORMAL LOW (ref >60)
GFR, EST AFRICAN AMERICAN: 44 mL/min — AB (ref >60)
GLUCOSE: 133 mg/dL — AB (ref 65–99)
Potassium: 4.1 mmol/L (ref 3.5–5.1)
Sodium: 136 mmol/L (ref 135–145)
TOTAL PROTEIN: 7.5 g/dL (ref 6.5–8.1)

## 2016-02-16 LAB — TSH: TSH: 3.871 u[IU]/mL (ref 0.350–4.500)

## 2016-02-16 LAB — TROPONIN I
Troponin I: <0.03 ng/mL (ref <0.03)
Troponin I: <0.03 ng/mL (ref <0.03)

## 2016-02-16 MED ORDER — DILTIAZEM LOAD VIA INFUSION
5.0000 mg | Freq: Once | INTRAVENOUS | Status: AC
  Administered 2016-02-16: 5 mg via INTRAVENOUS
  Filled 2016-02-16: qty 5

## 2016-02-16 MED ORDER — IPRATROPIUM-ALBUTEROL 0.5-2.5 (3) MG/3ML IN SOLN: 3.0000 mL | RESPIRATORY_TRACT | Status: DC | PRN

## 2016-02-16 MED ORDER — ALBUTEROL SULFATE (2.5 MG/3ML) 0.083% IN NEBU
5.0000 mg | INHALATION_SOLUTION | Freq: Once | RESPIRATORY_TRACT | Status: AC
  Administered 2016-02-16: 5 mg via RESPIRATORY_TRACT
  Filled 2016-02-16: qty 6

## 2016-02-16 MED ORDER — POTASSIUM CHLORIDE CRYS ER 20 MEQ PO TBCR
20.0000 meq | EXTENDED_RELEASE_TABLET | Freq: Every day | ORAL | Status: DC
  Administered 2016-02-17 – 2016-02-19 (×3): 20 meq via ORAL
  Filled 2016-02-16 (×3): qty 1

## 2016-02-16 MED ORDER — LORATADINE 10 MG PO TABS
10.0000 mg | ORAL_TABLET | Freq: Every morning | ORAL | Status: DC
  Administered 2016-02-17 – 2016-02-20 (×4): 10 mg via ORAL
  Filled 2016-02-16 (×4): qty 1

## 2016-02-16 MED ORDER — IPRATROPIUM-ALBUTEROL 0.5-2.5 (3) MG/3ML IN SOLN
3.0000 mL | Freq: Four times a day (QID) | RESPIRATORY_TRACT | Status: DC
  Administered 2016-02-16: 3 mL via RESPIRATORY_TRACT
  Filled 2016-02-16: qty 3

## 2016-02-16 MED ORDER — IPRATROPIUM-ALBUTEROL 0.5-2.5 (3) MG/3ML IN SOLN
3.0000 mL | Freq: Three times a day (TID) | RESPIRATORY_TRACT | Status: DC
  Administered 2016-02-17 – 2016-02-18 (×6): 3 mL via RESPIRATORY_TRACT
  Filled 2016-02-16 (×5): qty 3

## 2016-02-16 MED ORDER — FUROSEMIDE 10 MG/ML IJ SOLN
40.0000 mg | Freq: Two times a day (BID) | INTRAMUSCULAR | Status: DC
  Administered 2016-02-17 – 2016-02-18 (×3): 40 mg via INTRAVENOUS
  Filled 2016-02-16 (×3): qty 4

## 2016-02-16 MED ORDER — DILTIAZEM HCL 100 MG IV SOLR
5.0000 mg/h | INTRAVENOUS | Status: DC
  Administered 2016-02-16 (×2): 5 mg/h via INTRAVENOUS
  Filled 2016-02-16 (×2): qty 100

## 2016-02-16 MED ORDER — ONDANSETRON HCL 4 MG/2ML IJ SOLN: 4.0000 mg | Freq: Four times a day (QID) | INTRAMUSCULAR | Status: DC | PRN

## 2016-02-16 MED ORDER — BENZONATATE 100 MG PO CAPS
200.0000 mg | ORAL_CAPSULE | Freq: Two times a day (BID) | ORAL | Status: DC | PRN
  Administered 2016-02-19: 200 mg via ORAL
  Filled 2016-02-16: qty 2

## 2016-02-16 MED ORDER — DULOXETINE HCL 60 MG PO CPEP
60.0000 mg | ORAL_CAPSULE | Freq: Every day | ORAL | Status: DC
  Administered 2016-02-16 – 2016-02-19 (×4): 60 mg via ORAL
  Filled 2016-02-16 (×4): qty 1

## 2016-02-16 MED ORDER — FENTANYL CITRATE (PF) 100 MCG/2ML IJ SOLN
50.0000 ug | Freq: Once | INTRAMUSCULAR | Status: AC
  Administered 2016-02-16: 50 ug via INTRAVENOUS
  Filled 2016-02-16: qty 2

## 2016-02-16 MED ORDER — FLUTICASONE PROPIONATE 50 MCG/ACT NA SUSP
2.0000 | Freq: Every day | NASAL | Status: DC
  Administered 2016-02-17 – 2016-02-20 (×4): 2 via NASAL
  Filled 2016-02-16: qty 16

## 2016-02-16 MED ORDER — NITROGLYCERIN 0.4 MG SL SUBL: 0.4000 mg | SUBLINGUAL_TABLET | SUBLINGUAL | Status: DC | PRN

## 2016-02-16 MED ORDER — ALPRAZOLAM 0.25 MG PO TABS
0.2500 mg | ORAL_TABLET | Freq: Two times a day (BID) | ORAL | Status: DC | PRN
  Administered 2016-02-16 – 2016-02-20 (×4): 0.25 mg via ORAL
  Filled 2016-02-16 (×4): qty 1

## 2016-02-16 MED ORDER — SODIUM CHLORIDE 0.9% FLUSH
3.0000 mL | INTRAVENOUS | Status: DC | PRN
  Administered 2016-02-19: 3 mL via INTRAVENOUS
  Filled 2016-02-16: qty 3

## 2016-02-16 MED ORDER — ACETAMINOPHEN 500 MG PO TABS: 500.0000 mg | ORAL_TABLET | Freq: Every day | ORAL | Status: DC | PRN

## 2016-02-16 MED ORDER — ASPIRIN EC 81 MG PO TBEC
81.0000 mg | DELAYED_RELEASE_TABLET | Freq: Every morning | ORAL | Status: DC
  Administered 2016-02-17: 81 mg via ORAL
  Filled 2016-02-16: qty 1

## 2016-02-16 MED ORDER — FUROSEMIDE 10 MG/ML IJ SOLN
40.0000 mg | Freq: Once | INTRAMUSCULAR | Status: AC
  Administered 2016-02-16: 40 mg via INTRAVENOUS
  Filled 2016-02-16: qty 4

## 2016-02-16 MED ORDER — SODIUM CHLORIDE 0.9 % IV BOLUS (SEPSIS)
500.0000 mL | Freq: Once | INTRAVENOUS | Status: AC
  Administered 2016-02-16: 500 mL via INTRAVENOUS

## 2016-02-16 MED ORDER — SODIUM CHLORIDE 0.9 % IV SOLN: 250.0000 mL | INTRAVENOUS | Status: DC | PRN

## 2016-02-16 MED ORDER — ENOXAPARIN SODIUM 40 MG/0.4ML ~~LOC~~ SOLN
40.0000 mg | SUBCUTANEOUS | Status: DC
  Administered 2016-02-16: 40 mg via SUBCUTANEOUS
  Filled 2016-02-16: qty 0.4

## 2016-02-16 MED ORDER — MELATONIN 3 MG PO CAPS: 1.0000 | ORAL_CAPSULE | Freq: Every day | ORAL | Status: DC

## 2016-02-16 MED ORDER — GUAIFENESIN ER 600 MG PO TB12
600.0000 mg | ORAL_TABLET | Freq: Two times a day (BID) | ORAL | Status: DC
  Administered 2016-02-16 – 2016-02-20 (×8): 600 mg via ORAL
  Filled 2016-02-16 (×8): qty 1

## 2016-02-16 MED ORDER — ACETAMINOPHEN 325 MG PO TABS
650.0000 mg | ORAL_TABLET | ORAL | Status: DC | PRN
  Administered 2016-02-16 – 2016-02-19 (×3): 650 mg via ORAL
  Filled 2016-02-16 (×3): qty 2

## 2016-02-16 MED ORDER — DILTIAZEM LOAD VIA INFUSION: 10.0000 mg | Freq: Once | INTRAVENOUS | Status: DC

## 2016-02-16 MED ORDER — SODIUM CHLORIDE 0.9% FLUSH
3.0000 mL | Freq: Two times a day (BID) | INTRAVENOUS | Status: DC
  Administered 2016-02-16 – 2016-02-19 (×5): 3 mL via INTRAVENOUS

## 2016-02-16 MED ORDER — CARVEDILOL 12.5 MG PO TABS
12.5000 mg | ORAL_TABLET | Freq: Two times a day (BID) | ORAL | Status: DC
  Administered 2016-02-17 – 2016-02-20 (×7): 12.5 mg via ORAL
  Filled 2016-02-16 (×7): qty 1

## 2016-02-16 MED ORDER — PANTOPRAZOLE SODIUM 40 MG PO TBEC
40.0000 mg | DELAYED_RELEASE_TABLET | Freq: Every morning | ORAL | Status: DC
  Administered 2016-02-17 – 2016-02-20 (×4): 40 mg via ORAL
  Filled 2016-02-16 (×4): qty 1

## 2016-02-16 MED ORDER — DILTIAZEM HCL 100 MG IV SOLR: 5.0000 mg/h | INTRAVENOUS | Status: DC

## 2016-02-16 NOTE — ED Provider Notes (Signed)
[facility] DEPT Provider Note   CSN: [id number] Arrival date & time: 02/16/16  1348     History   Chief Complaint Chief Complaint  Patient presents with  . Shortness of Breath    HPI [name] is a 82 y.o. female.  Level V caveat for urgent need for intervention. Patient presents with dyspnea and pain in her left shoulder radiating to the left neck and chest. She has a known history of coronary artery disease with stenting in the past. Past history includes COPD,CHF.  Family states no prior history of atrial fibrillation.      Past Medical History:  Diagnosis Date  . Anxiety   . Arthritis   . Bursitis    left shoulder  . Cataract   . CHF (congestive heart failure) (HCC)    EF 55-60%  . Complication of anesthesia   . COPD (chronic obstructive pulmonary disease) ([place])   . Coronary atherosclerosis of native coronary artery    a. DES to CX, moderately severe stenosis RCA, mild stenosis LAD 04/2013  . Depression   . Dysphagia, unspecified(787.20)   . GERD (gastroesophageal reflux disease)    Hx Schatzki's ring, multiple EGD/ED last 01/06/2004  . Headache   . Heart disease   . Heart murmur    'a littel'  . HTN (hypertension)    Hx of it  . Hyperlipemia   . Hyperlipidemia   . Internal hemorrhoids without mention of complication   . MI (myocardial infarction) 2006  . Microscopic colitis 2003  . Other and unspecified hyperlipidemia   . Panic disorder without agoraphobia   . Paresthesia    hands, feet  . Pneumonia 12/2011  . PONV (postoperative nausea and vomiting)    'a little nausea"  . PVD (peripheral vascular disease) ([place])   . S/P colonoscopy 09/27/2001   internal hemorrhoids, desc colon inflam polyp, SB BX-chronic duodenitis, colitis  . Shortness of breath   . Sleep disorder    obstructive  . Thyroid disease    recent abnl TSH per pt    Patient Active Problem List   Diagnosis Date Noted  . Acute bronchitis 12/18/2015  . CAD in native artery     . Chest pain at rest 10/15/2015  . Chronic low back pain 10/15/2015  . Hyponatremia 10/15/2015  . Hyperglycemia 10/15/2015  . Thrombocytosis ([place]) 10/15/2015  . Atypical chest pain 10/15/2015  . Depression   . Anxiety   . Gastroesophageal reflux disease without esophagitis   . Mild cognitive impairment 10/14/2015  . Iron deficiency anemia due to chronic blood loss   . Meningitis 10/01/2015  . Coronary artery disease due to lipid rich plaque   . NSVT (nonsustained ventricular tachycardia) ([place])   . PAF (paroxysmal atrial fibrillation) ([place])   . CSF leak 09/27/2015  . Spondylolisthesis of lumbar region 09/24/2015  . Gastric polyp   . History of colonic polyps   . Chronic diarrhea   . Diarrhea 04/01/2015  . Esophageal dysphagia 04/01/2015  . PAOD (peripheral arterial occlusive disease) ([place]) 03/05/2015  . Pain in the chest   . Hyperlipidemia   . Weight gain 08/12/2014  . Normocytic anemia 08/07/2014  . Hemorrhoids 08/06/2014  . Diastolic CHF, acute on chronic (HCC) 07/30/2014  . CHF (congestive heart failure) ([place]) 07/29/2014  . Rectal bleeding 07/06/2014  . Constipation 07/06/2014  . Lower extremity edema 11/24/2013  . Acute kidney failure ([place]) 11/24/2013  . Hypokalemia 11/24/2013  . Other and unspecified angina pectoris 05/14/2013  .  Hematochezia 02/13/2011  . Major depression ([place]) 09/28/2010  . FATIGUE 04/13/2009  . Chronic diastolic heart failure ([place]) 11/30/2008  . DYSPNEA 11/30/2008  . HYPERLIPIDEMIA-MIXED 11/27/2008  . CAD, NATIVE VESSEL - PCI + DES to left circumflex 05/14/13 11/27/2008  . Peripheral vascular disease ([place]) 11/27/2008  . PANIC ATTACK 02/28/2008  . MI 02/28/2008  . Internal hemorrhoids 02/28/2008  . GASTROESOPHAGEAL REFLUX DISEASE, CHRONIC 02/28/2008  . COLITIS 02/28/2008  . Dysphagia 02/28/2008    Past Surgical History:  Procedure Laterality Date  . ABDOMINAL HYSTERECTOMY    . ANTERIOR AND POSTERIOR REPAIR     with resection of vagina   . APPENDECTOMY    . BACK SURGERY    . BIOPSY  07/05/2015   Procedure: BIOPSY;  Surgeon: Daneil Dolin, MD;  [facility];  Service: Endoscopy;;  gastric polyp biopsy, ascending colon biopsy  . BLADDER SUSPENSION  11/09/2011   Procedure: TRANSVAGINAL TAPE (TVT) PROCEDURE;  Surgeon: Marissa Nestle, MD;  Location: [facility] ORS;  Service: Urology;  Laterality: N/A;  . bladder tack  06/2010  . BREAST LUMPECTOMY  1998   left, benign  . CARDIAC CATHETERIZATION    . CARDIAC CATHETERIZATION    . CARDIAC CATHETERIZATION N/A 12/16/2015   Procedure: Left Heart Cath and Coronary Angiography;  Surgeon: Troy Sine, MD;  Location: [facility];  Service: Cardiovascular;  Laterality: N/A;  . [place]   left  . CHOLECYSTECTOMY  1998  . COLONOSCOPY  03/16/2011   multiple hyperplastic colon polyps, sigmoid diverticulosis, melanosis coli  . COLONOSCOPY WITH PROPOFOL N/A 07/05/2015   RMR:one 5 mm polyp in descending colon  . CORONARY ANGIOPLASTY WITH STENT PLACEMENT    . ESOPHAGEAL DILATION N/A 07/05/2015   Procedure: ESOPHAGEAL DILATION;  Surgeon: Daneil Dolin, MD;  Location[facility];  Service: Endoscopy;  Laterality: N/A;  . ESOPHAGOGASTRODUODENOSCOPY (EGD) WITH PROPOFOL N/A 07/05/2015   [id number]  . JOINT REPLACEMENT Right 2007  . left hand surgery    . LEFT HEART CATHETERIZATION WITH CORONARY ANGIOGRAM N/A 05/14/2013   Procedure: LEFT HEART CATHETERIZATION WITH CORONARY ANGIOGRAM;  Surgeon: Blane Ohara, MD;  Location: [facility] CATH LAB;  Service: Cardiovascular;  Laterality: N/A;  . left rotator cuff surgery    . LUMBAR LAMINECTOMY/DECOMPRESSION MICRODISCECTOMY N/A 10/11/2012   Procedure: LUMBAR LAMINECTOMY/DECOMPRESSION MICRODISCECTOMY 2 LEVELS;  Surgeon: Floyce Stakes, MD;  Location: [facility] NEURO ORS;  Service: Neurosurgery;  Laterality: N/A;  L3-4 L4-5 Laminectomy  . LUMBAR WOUND DEBRIDEMENT N/A 09/27/2015   Procedure: Exploration of Lumbar Wound w/ Repair CSF  Leak/Lumbar Drain Placement;  Surgeon: Leeroy Cha, MD;  Location: [facility] NEURO ORS;  Service: Neurosurgery;  Laterality: N/A;  . MALONEY DILATION  03/16/2011   Gastritis. No H.pylori on bx. 63F maloney dilation with disruption of  occult cevical esophageal web  . NASAL SINUS SURGERY    . right knee replacement  2007  . right leg benign tumor    . SHOULDER SURGERY Left   . TONSILLECTOMY    . unspecified area, hysterectomy  1972   partial    OB History    Gravida Para Term Preterm AB Living   8 6     2 5    SAB TAB Ectopic Multiple Live Births                   Home Medications    Prior to Admission medications   Medication Sig Start Date End Date Taking? Authorizing  Provider  acetaminophen (TYLENOL) 500 MG tablet Take 500 mg by mouth daily as needed for headache.    Yes Historical Provider, MD  ALPRAZolam (XANAX) 0.25 MG tablet Take 0.25 mg by mouth 2 (two) times daily as needed for anxiety.    Yes Historical Provider, MD  aspirin 81 MG EC tablet Take 81 mg by mouth every morning.    Yes Historical Provider, MD  carvedilol (COREG) 25 MG tablet Take 12.5 mg by mouth 2 (two) times daily with a meal.    Yes Historical Provider, MD  DULoxetine (CYMBALTA) 60 MG capsule Take 60 mg by mouth at bedtime.    Yes Historical Provider, MD  fluticasone (FLONASE) 50 MCG/ACT nasal spray Place 2 sprays into both nostrils daily.    Yes Historical Provider, MD  furosemide (LASIX) 20 MG tablet Take 1 tablet (20 mg total) by mouth daily. 05/25/15  Yes Lendon Colonel, NP  ipratropium-albuterol (DUONEB) 0.5-2.5 (3) MG/3ML SOLN Take 3 mLs by nebulization every 6 (six) hours. 12/18/15  Yes Belkys A Regalado, MD  loratadine (CLARITIN) 10 MG tablet Take 10 mg by mouth every morning.   Yes Historical Provider, MD  Melatonin 3 MG CAPS Take 1 capsule by mouth at bedtime.   Yes Historical Provider, MD  nitroGLYCERIN (NITROSTAT) 0.4 MG SL tablet Place 0.4 mg under the tongue every 5 (five) minutes as needed for  chest pain. Reported on 08/04/2015   Yes Historical Provider, MD  pantoprazole (PROTONIX) 40 MG tablet Take 40 mg by mouth every morning.    Yes Historical Provider, MD  potassium chloride (K-DUR,KLOR-CON) 10 MEQ tablet TAKE TWO (2) TABLETS BY MOUTH DAILY. 04/30/15  Yes Herminio Commons, MD  doxycycline (VIBRA-TABS) 100 MG tablet Take 1 tablet (100 mg total) by mouth every 12 (twelve) hours. Patient not taking: Reported on 02/16/2016 12/18/15   Belkys A Regalado, MD  gabapentin (NEURONTIN) 300 MG capsule Take 1 capsule (300 mg total) by mouth 3 (three) times daily. Patient not taking: Reported on 02/16/2016 10/08/15   Kevan Ny Ditty, MD    Family History Family History  Problem Relation Age of Onset  . Stroke Mother   . Parkinson's disease Father   . Coronary artery disease Other     family Hx-sons  . Cancer Other   . Stroke Other     family Hx  . Hypertension Other     family Hx  . Diabetes Brother   . Heart disease Son     before age 16  . Diabetes Son   . Stroke Daughter 54    Social History Social History  Substance Use Topics  . Smoking status: Former Smoker    Packs/day: 1.00    Years: 64.00    Types: Cigarettes    Start date: 12/24/1947    Quit date: 11/17/2001  . Smokeless tobacco: Never Used     Comment: Quit smoking in 2003  . Alcohol use No     Allergies   Cephalosporins; Levaquin [levofloxacin in d5w]; Macrodantin [nitrofurantoin macrocrystal]; Phenothiazines; Polysorbate; Prednisone; Buspirone; Cardura [doxazosin mesylate]; Codeine; Acyclovir and related; Prochlorperazine; Ranexa [ranolazine]; Atorvastatin; Ofloxacin; Other; Penicillins; and Pimozide   Review of Systems Review of Systems  Reason unable to perform ROS: Urgent need for intervention.     Physical Exam Updated Vital Signs BP 142/85   Pulse 82   Temp 97.4 F (36.3 C) (Oral)   Resp 21   Ht 5\' 1"  (1.549 m)   Wt 168 lb (76.2 kg)  SpO2 92%   BMI 31.74 kg/m   Physical Exam    Constitutional: She is oriented to person, place, and time. She appears well-developed and well-nourished.  HENT:  Head: Normocephalic and atraumatic.  Eyes: Conjunctivae are normal.  Neck: Neck supple.  Cardiovascular:  Tachycardic, irregularly irregular  Pulmonary/Chest: Effort normal and breath sounds normal.  Abdominal: Soft. Bowel sounds are normal.  Musculoskeletal: Normal range of motion.  Neurological: She is alert and oriented to person, place, and time.  Skin: Skin is warm and dry.  Psychiatric: She has a normal mood and affect. Her behavior is normal.  Nursing note and vitals reviewed.    ED Treatments / Results  Labs (all labs ordered are listed, but only abnormal results are displayed) Labs Reviewed  CBC WITH DIFFERENTIAL/PLATELET - Abnormal; Notable for the following:       Result Value   RBC 3.73 (*)    Hemoglobin 9.6 (*)    HCT 30.5 (*)    MCH 25.7 (*)    RDW 16.4 (*)    Platelets 430 (*)    All other components within normal limits  COMPREHENSIVE METABOLIC PANEL - Abnormal; Notable for the following:    Glucose, Bld 133 (*)    Creatinine, Ser 1.28 (*)    Calcium 8.7 (*)    ALT 13 (*)    GFR calc non Af Amer 38 (*)    GFR calc Af Amer 44 (*)    All other components within normal limits  TROPONIN I  TSH  MAGNESIUM  BRAIN NATRIURETIC PEPTIDE    EKG  EKG Interpretation  Date/Time:  Wednesday February 16 2016 14:09:53 EDT Ventricular Rate:  97 PR Interval:    QRS Duration: 74 QT Interval:  374 QTC Calculation: 474 R Axis:   -19 Text Interpretation:  Atrial fibrillation Low voltage QRS Nonspecific T wave abnormality Abnormal ECG Confirmed by Danayah Smyre  MD, Celestino Ackerman ([id number]) on 02/16/2016 2:41:59 PM       Radiology Dg Chest 2 View  Result Date: 02/16/2016 CLINICAL DATA:  Worsening shortness of breath for 3 weeks, RIGHT shoulder pain for 1 week, history CHF, COPD, hypertension, former smoker, pneumonia, MI EXAM: CHEST  2 VIEW COMPARISON:  12/15/2015  FINDINGS: Enlargement of cardiac silhouette. Atherosclerotic calcification aorta. Diffuse interstitial infiltrates bilaterally increased since previous exam favor pulmonary edema though infection not completely excluded. Tiny pleural effusions blunt the posterior costophrenic angles. No pneumothorax. Diffuse osseous demineralization. IMPRESSION: Enlargement of cardiac silhouette. Diffuse interstitial infiltrates increased since previous exam favoring pulmonary edema. Electronically Signed   By: Lavonia Dana M.D.   On: 02/16/2016 17:17    Procedures Procedures (including critical care time)  Medications Ordered in ED Medications  diltiazem (CARDIZEM) 1 mg/mL load via infusion 5 mg (5 mg Intravenous Bolus from Bag 02/16/16 1621)    And  diltiazem (CARDIZEM) 100 mg in dextrose 5 % 100 mL (1 mg/mL) infusion (5 mg/hr Intravenous New Bag/Given 02/16/16 1619)  furosemide (LASIX) injection 40 mg (not administered)  albuterol (PROVENTIL) (2.5 MG/3ML) 0.083% nebulizer solution 5 mg (5 mg Nebulization Given 02/16/16 1419)  sodium chloride 0.9 % bolus 500 mL (500 mLs Intravenous New Bag/Given 02/16/16 1625)  fentaNYL (SUBLIMAZE) injection 50 mcg (50 mcg Intravenous Given 02/16/16 1617)  fentaNYL (SUBLIMAZE) injection 50 mcg (50 mcg Intravenous Given 02/16/16 1753)     Initial Impression / Assessment and Plan / ED Course  I have reviewed the triage vital signs and the nursing notes.  Pertinent labs & imaging results that  were available during my care of the patient were reviewed by me and considered in my medical decision making (see chart for details).  Clinical Course  CRITICAL CARE Performed by: Nat Christen Total critical care time: 30 minutes Critical care time was exclusive of separately billable procedures and treating other patients. Critical care was necessary to treat or prevent imminent or life-threatening deterioration. Critical care was time spent personally by me on the following activities:  development of treatment plan with patient and/or surrogate as well as nursing, discussions with consultants, evaluation of patient's response to treatment, examination of patient, obtaining history from patient or surrogate, ordering and performing treatments and interventions, ordering and review of laboratory studies, ordering and review of radiographic studies, pulse oximetry and re-evaluation of patient's condition.  Initial rhythm is atrial fibrillation with RVR. Chest x-ray showspulmonary edema. Will start Cardizem drip and IV Lasix. Admit to telemetry.  Final Clinical Impressions(s) / ED Diagnoses   Final diagnoses:  Acute congestive heart failure, unspecified congestive heart failure type [facility])  Atrial fibrillation, unspecified type [facility])    New Prescriptions New Prescriptions   No medications on file     Nat Christen, MD 02/16/16 1842

## 2016-02-16 NOTE — Progress Notes (Signed)
Discussed patient with ER physician Dr Lacinda Axon. Presents with chest pain, new onset afib. EKG shows afib with RVR. Agree with diltiazem rate control at this time as long as bp's tolerate. Long history of chest pain with multiple admissions, recent admit with chest pain 11/2015, cath at that time mild to moderate CAD, small 80% RCA medically managed. CT PE was negative during that admit 11/2015 as well. Unless + enzymes or significant ischemic changes no repeat ischemic testing. May start IV heparin for afib if no contraindications. Full consult to follow tomorrow AM. No need to repeat echo.    Zandra Abts MD

## 2016-02-16 NOTE — ED Notes (Signed)
Pt stable and ready for transport to [facility].  Report called to Kathie Rhodes, RN.

## 2016-02-16 NOTE — Progress Notes (Signed)
PHARMACIST - PHYSICIAN ORDER COMMUNICATION  CONCERNING: P&T Medication Policy on Herbal Medications  DESCRIPTION:  This patient's order for: melatonin has been noted.  This product(s) is classified as an "herbal" or natural product. Due to a lack of definitive safety studies or FDA approval, nonstandard manufacturing practices, plus the potential risk of unknown drug-drug interactions while on inpatient medications, the Pharmacy and Therapeutics Committee does not permit the use of "herbal" or natural products of this type within [facility].   ACTION TAKEN: The pharmacy department is unable to verify this order at this time and your patient has been informed of this safety policy. Please reevaluate patient's clinical condition at discharge and address if the herbal or natural product(s) should be resumed at that time.  Aralyn Nowak, PharmD, BCPS Clinical Pharmacist    

## 2016-02-16 NOTE — ED Triage Notes (Signed)
Patient complains of right shoulder pain that radiates to left shoulder then neck x1 week. States she has felt fatigue and short of breath. NAD noted in triage.

## 2016-02-16 NOTE — H&P (Signed)
History and Physical    [name] FTD:[id number] DOB: 18-Jan-1933 DOA: 02/16/2016  Referring MD/NP/PA: Dr. Lacinda Axon PCP: Tawni Carnes, PA-C  Patient coming from:   Chief Complaint: Shortness of breath and chest and shoulder pain  HPI: [name] is a 82 y.o. female with medical history significant of HTN, HLD, PAF, CAD s/p stent, angina, CHF Last EF 55% by echo in 12/2015; who presents with complaints of shortness of breath and chest and shoulder pain.  Symptoms have been reported over the last week. She notes that she becomes short of breath with even walking a few steps or moving. Associated symptoms include cough with whitish sputum production, wheezing, malaise, weight loss of approximately 10 pounds in the last month, intermittent palpitations, and fatigue. She tried using home nebulizer treatments without symptoms. Describes a waxing and waning centralized chest pain. Also, notes superior right shoulder pain that she notes it's constant and radiates into her neck. Complains of swelling of the right shoulder and a nodule to and really comes upon the anterior aspect of the shoulder. Patient notes a previous history of peptic irregular heartbeat for which she notes that she previously had been on blood thinners but these were stopped back in June for her last back surgery. The patient recently had a cardiac cath performed on 12/16/2015 showing 20% stenosis of the proximal LAD, inferior branch of the first diagonal vessel, and distal ulnar bowing of the large dominant circumflex vessel. She was noted to have a patent left circumflex stent with small non-dominant RCA with 80% ostial stenosis. Medical therapy was recommended that there is not a significant change in her respiratory pressures from inspiration and expiratory cycles. She notes that she quit smoking back in 2003, does not regularly drink alcohol, or do any illicit drugs.  ED Course: Patient was evaluated and seen to be afebrile, heart  rates up to 126, respirations up to32, O2 saturations 91-99%, and blood pressures maintained. Patient was seen to be in A. fib for which she was started on a diltiazem drip. Initial EKG showed large cardiac silhouette with diffuse interstitial infiltrates. Lab work revealed WBC 7.5, hemoglobin 9.6, BUN 16, creatinine 1.28, troponin 0.03, and BNP 428. Patient was given 40 mg of Lasix IV and was placed on a diltiazem drip. TRH called to admit to stepdown.  Review of Systems: As per HPI otherwise 10 point review of systems negative.   Past Medical History:  Diagnosis Date  . Anxiety   . Arthritis   . Bursitis    left shoulder  . Cataract   . CHF (congestive heart failure) (HCC)    EF 55-60%  . Complication of anesthesia   . COPD (chronic obstructive pulmonary disease) ([place])   . Coronary atherosclerosis of native coronary artery    a. DES to CX, moderately severe stenosis RCA, mild stenosis LAD 04/2013  . Depression   . Dysphagia, unspecified(787.20)   . GERD (gastroesophageal reflux disease)    Hx Schatzki's ring, multiple EGD/ED last 01/06/2004  . Headache   . Heart disease   . Heart murmur    'a littel'  . HTN (hypertension)    Hx of it  . Hyperlipemia   . Hyperlipidemia   . Internal hemorrhoids without mention of complication   . MI (myocardial infarction) 2006  . Microscopic colitis 2003  . Other and unspecified hyperlipidemia   . Panic disorder without agoraphobia   . Paresthesia    hands, feet  . Pneumonia 12/2011  . PONV (  postoperative nausea and vomiting)    'a little nausea"  . PVD (peripheral vascular disease) ([place])   . S/P colonoscopy 09/27/2001   internal hemorrhoids, desc colon inflam polyp, SB BX-chronic duodenitis, colitis  . Shortness of breath   . Sleep disorder    obstructive  . Thyroid disease    recent abnl TSH per pt    Past Surgical History:  Procedure Laterality Date  . ABDOMINAL HYSTERECTOMY    . ANTERIOR AND POSTERIOR REPAIR     with resection of  vagina  . APPENDECTOMY    . BACK SURGERY    . BIOPSY  07/05/2015   Procedure: BIOPSY;  Surgeon: Daneil Dolin, MD;  [facility]: [facility];  Service: Endoscopy;;  gastric polyp biopsy, ascending colon biopsy  . BLADDER SUSPENSION  11/09/2011   Procedure: TRANSVAGINAL TAPE (TVT) PROCEDURE;  Surgeon: Marissa Nestle, MD;  Location: [facility] OR[facility];  Service: Urology;  Laterality: N/A;  . bladder tack  06/2010  . BREAST LUMPECTOMY  1998   left, benign  . CARDIAC CATHETERIZATION    . CARDIAC CATHETERIZATION    . CARDIAC CATHETERIZATION N/A 12/16/2015   Procedure: Left Heart Cath and Coronary Angiography;  Surgeon: Troy Sine, MD;  Location: [facility] LAB;  Service: Cardiovascular;  Laterality: N/A;  . [place]   left  . CHOLECYSTECTOMY  1998  . COLONOSCOPY  03/16/2011   multiple hyperplastic colon polyps, sigmoid diverticulosis, melanosis coli  . COLONOSCOPY WITH PROPOFOL N/A 07/05/2015   RMR:one 5 mm polyp in descending colon  . CORONARY ANGIOPLASTY WITH STENT PLACEMENT    . ESOPHAGEAL DILATION N/A 07/05/2015   Procedure: ESOPHAGEAL DILATION;  Surgeon: Daneil Dolin, MD;  [facility]: [facility];  Service: Endoscopy;  Laterality: N/A;  . ESOPHAGOGASTRODUODENOSCOPY (EGD) WITH PROPOFOL N/A 07/05/2015   NGE:XBMWUX  . JOINT REPLACEMENT Right 2007  . left hand surgery    . LEFT HEART CATHETERIZATION WITH CORONARY ANGIOGRAM N/A 05/14/2013   Procedure: LEFT HEART CATHETERIZATION WITH CORONARY ANGIOGRAM;  Surgeon: Blane Ohara, MD;  Location: [facility] CATH LAB;  Service: Cardiovascular;  Laterality: N/A;  . left rotator cuff surgery    . LUMBAR LAMINECTOMY/DECOMPRESSION MICRODISCECTOMY N/A 10/11/2012   Procedure: LUMBAR LAMINECTOMY/DECOMPRESSION MICRODISCECTOMY 2 LEVELS;  Surgeon: Floyce Stakes, MD;  Location: [facility] ORS;  Service: Neurosurgery;  Laterality: N/A;  L3-4 L4-5 Laminectomy  . LUMBAR WOUND DEBRIDEMENT N/A 09/27/2015   Procedure: Exploration of Lumbar Wound w/  Repair CSF Leak/Lumbar Drain Placement;  Surgeon: Leeroy Cha, MD;  Location: [facility]UR[facility] ORS;  Service: Neurosurgery;  Laterality: N/A;  . MALONEY DILATION  03/16/2011   Gastritis. No H.pylori on bx. 47F maloney dilation with disruption of  occult cevical esophageal web  . NASAL SINUS SURGERY    . right knee replacement  2007  . right leg benign tumor    . SHOULDER SURGERY Left   . TONSILLECTOMY    . unspecified area, hysterectomy  1972   partial     reports that she quit smoking about 14 years ago. Her smoking use included Cigarettes. She started smoking about 68 years ago. She has a 64.00 pack-year smoking history. She has never used smokeless tobacco. She reports that she does not drink alcohol or use drugs.  Allergies  Allergen Reactions  . Cephalosporins Diarrhea and Nausea Only    Lightheaded  . Levaquin [Levofloxacin In D5w] Swelling  . Macrodantin [Nitrofurantoin Macrocrystal] Swelling  . Phenothiazines Anaphylaxis and Hives  .  Polysorbate Anaphylaxis  . Prednisone Shortness Of Breath  . Buspirone Itching  . Cardura [Doxazosin Mesylate] Itching  . Codeine Itching  . Acyclovir And Related   . Prochlorperazine Other (See Comments)    unknown  . Ranexa [Ranolazine]     Severe drop in BP  . Atorvastatin Hives    Cramping; tolerates Crestor ok  . Ofloxacin Rash  . Other Itching and Rash    "WOOL"= make skin look like it has been burned  . Penicillins Other (See Comments)    Causes redness all over. Has patient had a PCN reaction causing immediate rash, facial/tongue/throat swelling, SOB or lightheadedness with hypotension: No Has patient had a PCN reaction causing severe rash involving mucus membranes or skin necrosis: No Has patient had a PCN reaction that required hospitalization No Has patient had a PCN reaction occurring within the last 10 years: No If all of the above answers are "NO", then may proceed with Cephalosporin use.   . Pimozide Hives and Itching     Family History  Problem Relation Age of Onset  . Stroke Mother   . Parkinson's disease Father   . Coronary artery disease Other     family Hx-sons  . Cancer Other   . Stroke Other     family Hx  . Hypertension Other     family Hx  . Diabetes Brother   . Heart disease Son     before age 8  . Diabetes Son   . Stroke Daughter 64    Prior to Admission medications   Medication Sig Start Date End Date Taking? Authorizing Provider  acetaminophen (TYLENOL) 500 MG tablet Take 500 mg by mouth daily as needed for headache.    Yes Historical Provider, MD  ALPRAZolam (XANAX) 0.25 MG tablet Take 0.25 mg by mouth 2 (two) times daily as needed for anxiety.    Yes Historical Provider, MD  aspirin 81 MG EC tablet Take 81 mg by mouth every morning.    Yes Historical Provider, MD  carvedilol (COREG) 25 MG tablet Take 12.5 mg by mouth 2 (two) times daily with a meal.    Yes Historical Provider, MD  DULoxetine (CYMBALTA) 60 MG capsule Take 60 mg by mouth at bedtime.    Yes Historical Provider, MD  fluticasone (FLONASE) 50 MCG/ACT nasal spray Place 2 sprays into both nostrils daily.    Yes Historical Provider, MD  furosemide (LASIX) 20 MG tablet Take 1 tablet (20 mg total) by mouth daily. 05/25/15  Yes Lendon Colonel, NP  ipratropium-albuterol (DUONEB) 0.5-2.5 (3) MG/3ML SOLN Take 3 mLs by nebulization every 6 (six) hours. 12/18/15  Yes Belkys A Regalado, MD  loratadine (CLARITIN) 10 MG tablet Take 10 mg by mouth every morning.   Yes Historical Provider, MD  Melatonin 3 MG CAPS Take 1 capsule by mouth at bedtime.   Yes Historical Provider, MD  nitroGLYCERIN (NITROSTAT) 0.4 MG SL tablet Place 0.4 mg under the tongue every 5 (five) minutes as needed for chest pain. Reported on 08/04/2015   Yes Historical Provider, MD  pantoprazole (PROTONIX) 40 MG tablet Take 40 mg by mouth every morning.    Yes Historical Provider, MD  potassium chloride (K-DUR,KLOR-CON) 10 MEQ tablet TAKE TWO (2) TABLETS BY MOUTH  DAILY. 04/30/15  Yes Herminio Commons, MD  doxycycline (VIBRA-TABS) 100 MG tablet Take 1 tablet (100 mg total) by mouth every 12 (twelve) hours. Patient not taking: Reported on 02/16/2016 12/18/15   Elmarie Shiley, MD  gabapentin (NEURONTIN) 300 MG capsule Take 1 capsule (300 mg total) by mouth 3 (three) times daily. Patient not taking: Reported on 02/16/2016 10/08/15   Kevan Ny Ditty, MD    Physical Exam: Constitutional: Elderly female who appears to be in moderate distress and is ill-appearing, but nontoxic. Vitals:   02/16/16 1815 02/16/16 1830 02/16/16 1845 02/16/16 1900  BP:  (!) 142/103  130/87  Pulse: (!) 37 74 (!) 50 (!) 131  Resp: 16 15 16 25   Temp:      TempSrc:      SpO2: 91% 91% 93% 90%  Weight:      Height:       Eyes: PERRL, lids and conjunctivae normal ENMT: Mucous membranes are moist. Posterior pharynx clear of any exudate or lesions. Neck: normal, supple, no masses, no thyromegaly  Respiratory: Patient becomes very tachypneic with movement. Bilateral crackles and expiratory wheezes appreciated. Patient talking in shorten sentences Cardiovascular: Irregular regular, no murmurs / rubs / gallops. +1 pitting lower extremity edema. 2+ pedal pulses. No carotid bruits.  Abdomen: no tenderness, no masses palpated. No hepatosplenomegaly. Bowel sounds positive.  Musculoskeletal: no clubbing / cyanosis. Mild swelling of the right shoulder joint with tenderness to palpation noted and decreased overall range of motion. Otherwise patient with normal range of motion of all extremities.  Skin: no rashes, lesions, ulcers. No induration Neurologic: CN 2-12 grossly intact. Sensation intact, DTR normal. Strength 5/5 in all 4.  Psychiatric: Normal judgment and insight. Alert and oriented x 3. Anxious mood.     Labs on Admission: I have personally reviewed following labs and imaging studies  CBC:  Recent Labs Lab 02/16/16 1627  WBC 7.5  NEUTROABS 4.6  HGB 9.6*  HCT 30.5*   MCV 81.8  PLT 259*   Basic Metabolic Panel:  Recent Labs Lab 02/16/16 1627  NA 136  K 4.1  CL 103  CO2 24  GLUCOSE 133*  BUN 16  CREATININE 1.28*  CALCIUM 8.7*  MG 1.8   GFR: Estimated Creatinine Clearance: 32.2 mL/min (by C-G formula based on SCr of 1.28 mg/dL (H)). Liver Function Tests:  Recent Labs Lab 02/16/16 1627  AST 23  ALT 13*  ALKPHOS 66  BILITOT 0.5  PROT 7.5  ALBUMIN 3.8   No results for input(s): LIPASE, AMYLASE in the last 168 hours. No results for input(s): AMMONIA in the last 168 hours. Coagulation Profile: No results for input(s): INR, PROTIME in the last 168 hours. Cardiac Enzymes:  Recent Labs Lab 02/16/16 1627  TROPONINI <0.03   BNP (last 3 results) No results for input(s): PROBNP in the last 8760 hours. HbA1C: No results for input(s): HGBA1C in the last 72 hours. CBG: No results for input(s): GLUCAP in the last 168 hours. Lipid Profile: No results for input(s): CHOL, HDL, LDLCALC, TRIG, CHOLHDL, LDLDIRECT in the last 72 hours. Thyroid Function Tests:  Recent Labs  02/16/16 1627  TSH 3.871   Anemia Panel: No results for input(s): VITAMINB12, FOLATE, FERRITIN, TIBC, IRON, RETICCTPCT in the last 72 hours. Urine analysis:    Component Value Date/Time   COLORURINE YELLOW 10/27/2015 1345   APPEARANCEUR HAZY (A) 10/27/2015 1345   LABSPEC 1.010 10/27/2015 1345   PHURINE 5.5 10/27/2015 1345   GLUCOSEU NEGATIVE 10/27/2015 1345   HGBUR TRACE (A) 10/27/2015 1345   BILIRUBINUR NEGATIVE 10/27/2015 1345   KETONESUR NEGATIVE 10/27/2015 1345   PROTEINUR NEGATIVE 10/27/2015 1345   UROBILINOGEN 0.2 02/21/2015 2028   NITRITE NEGATIVE 10/27/2015 1345   LEUKOCYTESUR MODERATE (  A) 10/27/2015 1345   Sepsis Labs: No results found for this or any previous visit (from the past 240 hour(s)).   Radiological Exams on Admission: Dg Chest 2 View  Result Date: 02/16/2016 CLINICAL DATA:  Worsening shortness of breath for 3 weeks, RIGHT shoulder  pain for 1 week, history CHF, COPD, hypertension, former smoker, pneumonia, MI EXAM: CHEST  2 VIEW COMPARISON:  12/15/2015 FINDINGS: Enlargement of cardiac silhouette. Atherosclerotic calcification aorta. Diffuse interstitial infiltrates bilaterally increased since previous exam favor pulmonary edema though infection not completely excluded. Tiny pleural effusions blunt the posterior costophrenic angles. No pneumothorax. Diffuse osseous demineralization. IMPRESSION: Enlargement of cardiac silhouette. Diffuse interstitial infiltrates increased since previous exam favoring pulmonary edema. Electronically Signed   By: Lavonia Dana M.D.   On: 02/16/2016 17:17    EKG: Independently reviewed. Atrial fibrillation  Assessment/Plan Acute diastolic CHF (congestive heart failure): Acute. Patient with reported shortness breath and chest pain. Chest x-ray shows large cardiac silhouette with diffuse interstitial edema and BNP elevated at 483. Patient with recent echocardiogram and cardiac cath the last 3 months. - Admit to stepdown - Heart failure protocol initiated - Strict ins and outs - Lasix 40 mg IV q 12 hrs  - Cardiology consulted by ED, follow up recommendations in a.m.  COPD exacerbation : Acute. Patient found to have wheezing on physical exam. Patient reports significant allergy to prednisone of shortness of breath. - Continuous pulse oximetry with nasal cannula oxygen to keep O2 sats: 90%,   - DuoNeb qid and prn SOB/Wheezing - Mucinex   Paroxysmal atrial fibrillation: Chadvasc score = 6 . Patient notes that blood thinners were stopped back in June prior to her back surgery but were never restarted. - Continue Diltiazem drip  Chest pain: Acute on chronic per review of records. Patient previously seen to have - Check UDS  -Trend cardiac troponins  - Follow-up with cardiology for recommendations on any medication changes needed to decrease symptoms   Essential hypertension - Continue Coreg and  wean diltiazem gtt to off if able  CAD s/p stent: Patient with recent cardiac cath for which he was recommended medical management. - Continue aspirin  Anemia: Hemoglobin 9.6 on admission. - Continue to monitor  Thrombocytosis: Acute. Initial platelet count 430. Likely reactive.  - Recheck platelet count in a.m.   Chronic kidney disease stage III: Chronic. Baseline creatinine improved from previous. - recheck BMP in a.m.   Chronic pain - Continue Cymbalta  Anxiety - Continue Xanax prn  GERD  - Continue Protonix  DVT prophylaxis: Lovenox  Code Status: Full Family Communication: No family at bedside Disposition Plan: Likely discharge home once medically stable Consults called: None Admission status: Inpatient  Norval Morton MD [facility] Pager [phone]  If 7PM-7AM, please contact night-coverage [email or web address] Password [email or web address]  02/16/2016, 7:29 PM

## 2016-02-16 NOTE — ED Notes (Signed)
Labs drawn by lab technician.

## 2016-02-17 DIAGNOSIS — J441 Chronic obstructive pulmonary disease with (acute) exacerbation: Secondary | ICD-10-CM | POA: Diagnosis present

## 2016-02-17 DIAGNOSIS — I5033 Acute on chronic diastolic (congestive) heart failure: Secondary | ICD-10-CM

## 2016-02-17 DIAGNOSIS — I480 Paroxysmal atrial fibrillation: Secondary | ICD-10-CM

## 2016-02-17 DIAGNOSIS — I10 Essential (primary) hypertension: Secondary | ICD-10-CM | POA: Diagnosis present

## 2016-02-17 DIAGNOSIS — N183 Chronic kidney disease, stage 3 unspecified: Secondary | ICD-10-CM | POA: Diagnosis present

## 2016-02-17 DIAGNOSIS — J449 Chronic obstructive pulmonary disease, unspecified: Secondary | ICD-10-CM | POA: Diagnosis present

## 2016-02-17 LAB — BASIC METABOLIC PANEL
ANION GAP: 9 (ref 5–15)
BUN: 15 mg/dL (ref 6–20)
CALCIUM: 8.6 mg/dL — AB (ref 8.9–10.3)
CO2: 25 mmol/L (ref 22–32)
Chloride: 102 mmol/L (ref 101–111)
Creatinine, Ser: 1.26 mg/dL — ABNORMAL HIGH (ref 0.44–1.00)
GFR, EST AFRICAN AMERICAN: 45 mL/min — AB (ref >60)
GFR, EST NON AFRICAN AMERICAN: 39 mL/min — AB (ref >60)
GLUCOSE: 127 mg/dL — AB (ref 65–99)
POTASSIUM: 3.5 mmol/L (ref 3.5–5.1)
Sodium: 136 mmol/L (ref 135–145)

## 2016-02-17 LAB — TROPONIN I: Troponin I: <0.03 ng/mL (ref <0.03)

## 2016-02-17 LAB — CBC WITH DIFFERENTIAL/PLATELET
BASOS ABS: 0.1 10*3/uL (ref 0.0–0.1)
BASOS PCT: 1 %
Eosinophils Absolute: 0.3 10*3/uL (ref 0.0–0.7)
Eosinophils Relative: 4 %
HEMATOCRIT: 30 % — AB (ref 36.0–46.0)
HEMOGLOBIN: 9.5 g/dL — AB (ref 12.0–15.0)
LYMPHS PCT: 34 %
Lymphs Abs: 2.7 10*3/uL (ref 0.7–4.0)
MCH: 25.8 pg — ABNORMAL LOW (ref 26.0–34.0)
MCHC: 31.7 g/dL (ref 30.0–36.0)
MCV: 81.5 fL (ref 78.0–100.0)
Monocytes Absolute: 0.7 10*3/uL (ref 0.1–1.0)
Monocytes Relative: 8 %
NEUTROS ABS: 4.3 10*3/uL (ref 1.7–7.7)
NEUTROS PCT: 53 %
Platelets: 394 10*3/uL (ref 150–400)
RBC: 3.68 MIL/uL — AB (ref 3.87–5.11)
RDW: 16.4 % — AB (ref 11.5–15.5)
WBC: 8 10*3/uL (ref 4.0–10.5)

## 2016-02-17 LAB — MRSA PCR SCREENING: MRSA BY PCR: NEGATIVE

## 2016-02-17 MED ORDER — DILTIAZEM HCL 60 MG PO TABS: 60.0000 mg | ORAL_TABLET | Freq: Two times a day (BID) | ORAL | Status: DC

## 2016-02-17 MED ORDER — LIVING BETTER WITH HEART FAILURE BOOK: Freq: Once | Status: DC

## 2016-02-17 MED ORDER — DILTIAZEM HCL 30 MG PO TABS
30.0000 mg | ORAL_TABLET | Freq: Four times a day (QID) | ORAL | Status: DC
  Administered 2016-02-17 – 2016-02-18 (×4): 30 mg via ORAL
  Filled 2016-02-17 (×4): qty 1

## 2016-02-17 MED ORDER — APIXABAN 5 MG PO TABS
5.0000 mg | ORAL_TABLET | Freq: Two times a day (BID) | ORAL | Status: DC
  Administered 2016-02-17 – 2016-02-20 (×7): 5 mg via ORAL
  Filled 2016-02-17 (×7): qty 1

## 2016-02-17 MED ORDER — METHYLPREDNISOLONE SODIUM SUCC 125 MG IJ SOLR
60.0000 mg | Freq: Two times a day (BID) | INTRAMUSCULAR | Status: DC
  Administered 2016-02-17 – 2016-02-19 (×5): 60 mg via INTRAVENOUS
  Filled 2016-02-17 (×6): qty 2

## 2016-02-17 NOTE — Consult Note (Signed)
   [facility] CM Inpatient Consult   02/17/2016  [name] 17-Mar-1933 [id number]   Chart review revealed patient eligible for [facility] Management services and post hospital discharge follow up related to a diagnosis of Heart Failure. Patient was evaluated for community based chronic disease management services with [facility] care Management Program as a benefit of patient's Medicare. Met with the patient at the bedside to explain [facility] services. Patient endorses her primary care provider to be Tawni Carnes PA-C at [facility].  Consent form signed. Patient gave [phone] as the best number to reach her. She also gave written permission to call daughter [name] at [phone] if she can not be reached. Patient will receive post hospital discharge calls and be evaluated for monthly home visits. [facility] Care Management services does not interfere with or replace any services arranged by the inpatient care management team. RNCM left contact information and [facility] literature at the bedside. Made inpatient RNCM aware that [facility] will be following for care management.   For additional questions please contact: Royetta Crochet. Laymond Purser, RN, BSN, [facility] Liaison [phone]

## 2016-02-17 NOTE — Evaluation (Signed)
Physical Therapy Evaluation Patient Details Name: [name] MRN: [id number] DOB: Jan 21, 1933 Today's Date: 02/17/2016   History of Present Illness  [name] is a 82 y.o. female with medical history significant of HTN, HLD, PAF, CAD s/p stent, angina, CHF Last EF 55% by echo in 12/2015; who presents with complaints of shortness of breath and chest and shoulder pain.  Symptoms have been reported over the last week. She notes that she becomes short of breath with even walking a few steps or moving. Associated symptoms include cough with whitish sputum production, wheezing, malaise, weight loss of approximately 10 pounds in the last month, intermittent palpitations, and fatigue. She tried using home nebulizer treatments without symptoms. Describes a waxing and waning centralized chest pain. Also, notes superior right shoulder pain that she notes it's constant and radiates into her neck. Complains of swelling of the right shoulder and a nodule to and really comes upon the anterior aspect of the shoulder. Patient notes a previous history of peptic irregular heartbeat for which she notes that she previously had been on blood thinners but these were stopped back in June for her last back surgery. The patient recently had a cardiac cath performed on 12/16/2015 showing 20% stenosis of the proximal LAD, inferior branch of the first diagonal vessel, and distal ulnar bowing of the large dominant circumflex vessel. She was noted to have a patent left circumflex stent with small non-dominant RCA with 80% ostial stenosis. Medical therapy was recommended that there is not a significant change in her respiratory pressures from inspiration and expiratory cycles. She notes that she quit smoking back in 2003, does not regularly drink alcohol, or do any illicit drugs.  Dx: Afib with RVR.    Clinical Impression  Pt received in bed, and was agreeable to PT evaluation.  Pt lives with her son, and now her dtr has come down to  stay with her for a while.  She is normally independent with ambulation, ADL's, and reports that before she became sick, she was still driving.  She is a Hydrographic surveyor, and states that she does her exercises that she was given when she had HHPT before.  She does have a non-healing wound over the scar from her R TKA- she states this has never healed, and it has been 2 years.  During PT evaluation, she ambulated 110ft with no AD, and no LOB.  Her HR throughout PT session ranged from 64bpm - 100bpm.  At this point, she does not demonstrate need for skilled PT, and was encouraged to mobilize with nursing staff due to lines/wires.  PT will sign off at this time.      Follow Up Recommendations No PT follow up    Equipment Recommendations  None recommended by PT    Recommendations for Other Services       Precautions / Restrictions Precautions Precautions: None Restrictions Weight Bearing Restrictions: No      Mobility  Bed Mobility Overal bed mobility: Modified Independent                Transfers Overall transfer level: Modified independent Equipment used: None                Ambulation/Gait Ambulation/Gait assistance: Independent Ambulation Distance (Feet): 20 Feet Assistive device: None Gait Pattern/deviations: WFL(Within Functional Limits)     General Gait Details: gait distance limited to HR monitor leads.  No LOB during mobility.    Stairs  Wheelchair Mobility    Modified Rankin (Stroke Patients Only)       Balance Overall balance assessment: No apparent balance deficits (not formally assessed)                                           Pertinent Vitals/Pain Pain Assessment: 0-10 Pain Score: 9  Pain Location: R shoulder blade.  Pain Descriptors / Indicators: Sharp Pain Intervention(s): Limited activity within patient's tolerance;Monitored during session;Repositioned    Home Living   Living Arrangements:  Children (Dtr stays with her, and she lives with her son.  ) Available Help at Discharge: Available 24 hours/day Type of Home: House Home Access: Ramped entrance     Home Layout: One level Home Equipment: Grab bars - tub/shower;Hand held shower head;Tub bench;Bedside commode;Cane - single point;Walker - 4 wheels      Prior Function Level of Independence: Independent with assistive device(s)   Gait / Transfers Assistance Needed: Pt ambulates with a cane.   ADL's / Homemaking Assistance Needed: independent with ADL's.  Pt cooks.  Pt still driving and going to the grocery store.         Hand Dominance   Dominant Hand: Right    Extremity/Trunk Assessment   Upper Extremity Assessment: Overall WFL for tasks assessed           Lower Extremity Assessment: Overall WFL for tasks assessed         Communication   Communication: No difficulties  Cognition Arousal/Alertness: Awake/alert Behavior During Therapy: WFL for tasks assessed/performed Overall Cognitive Status: Within Functional Limits for tasks assessed                      General Comments      Exercises     Assessment/Plan    PT Assessment Patent does not need any further PT services  PT Problem List            PT Treatment Interventions      PT Goals (Current goals can be found in the Care Plan section)  Acute Rehab PT Goals PT Goal Formulation: All assessment and education complete, DC therapy    Frequency     Barriers to discharge        Co-evaluation               End of Session Equipment Utilized During Treatment: Gait belt;Oxygen Activity Tolerance: Patient tolerated treatment well Patient left: in chair;with call bell/phone within reach Nurse Communication: Mobility status Mikki Santee notified of pt's mobiltiy status)    Functional Assessment Tool Used: [employer] "6-clicks"  Functional Limitation: Mobility: Walking and moving around Mobility: Walking and Moving  Around Current Status [phone]): 0 percent impaired, limited or restricted Mobility: Walking and Moving Around Goal Status [phone]): 0 percent impaired, limited or restricted Mobility: Walking and Moving Around Discharge Status [phone]): 0 percent impaired, limited or restricted    Time: 1425-1444 PT Time Calculation (min) (ACUTE ONLY): 19 min   Charges:   PT Evaluation $PT Eval Low Complexity: 1 Procedure     PT G Codes:   PT G-Codes **NOT FOR INPATIENT CLASS** Functional Assessment Tool Used: [employer] "6-clicks"  Functional Limitation: Mobility: Walking and moving around Mobility: Walking and Moving Around Current Status [phone]): 0 percent impaired, limited or restricted Mobility: Walking and Moving Around Goal Status [phone]): 0 percent impaired, limited  or restricted Mobility: Walking and Moving Around Discharge Status [phone]): 0 percent impaired, limited or restricted   Beth Timya Trimmer, PT, DPT X: [phone]

## 2016-02-17 NOTE — Progress Notes (Signed)
ANTICOAGULATION CONSULT NOTE - Initial Consult  Pharmacy Consult for eliquis Indication: atrial fibrillation  Allergies  Allergen Reactions  . Cephalosporins Diarrhea and Nausea Only    Lightheaded  . Levaquin [Levofloxacin In D5w] Swelling  . Macrodantin [Nitrofurantoin Macrocrystal] Swelling  . Phenothiazines Anaphylaxis and Hives  . Polysorbate Anaphylaxis  . Prednisone Shortness Of Breath  . Buspirone Itching  . Cardura [Doxazosin Mesylate] Itching  . Codeine Itching  . Acyclovir And Related   . Prochlorperazine Other (See Comments)    unknown  . Ranexa [Ranolazine]     Severe drop in BP  . Atorvastatin Hives    Cramping; tolerates Crestor ok  . Ofloxacin Rash  . Other Itching and Rash    "WOOL"= make skin look like it has been burned  . Penicillins Other (See Comments)    Causes redness all over. Has patient had a PCN reaction causing immediate rash, facial/tongue/throat swelling, SOB or lightheadedness with hypotension: No Has patient had a PCN reaction causing severe rash involving mucus membranes or skin necrosis: No Has patient had a PCN reaction that required hospitalization No Has patient had a PCN reaction occurring within the last 10 years: No If all of the above answers are "NO", then may proceed with Cephalosporin use.   . Pimozide Hives and Itching    Patient Measurements: Height: 5\' 1"  (154.9 cm) Weight: 168 lb 3.4 oz (76.3 kg) IBW/kg (Calculated) : 47.8   Vital Signs: Temp: 96.9 F (36.1 C) (11/02 0735) Temp Source: Oral (11/02 0735) BP: 116/46 (11/02 0700)  Labs:  Recent Labs  02/16/16 1627 02/16/16 1957 02/17/16 0158 02/17/16 0755  HGB 9.6*  --  9.5*  --   HCT 30.5*  --  30.0*  --   PLT 430*  --  394  --   CREATININE 1.28*  --  1.26*  --   TROPONINI <0.03 <0.03 <0.03 <0.03    Estimated Creatinine Clearance: 32.7 mL/min (by C-G formula based on SCr of 1.26 mg/dL (H)).   Medical History: Past Medical History:  Diagnosis Date  .  Anxiety   . Arthritis   . Bursitis    left shoulder  . Cataract   . CHF (congestive heart failure) (HCC)    EF 55-60%  . Complication of anesthesia   . COPD (chronic obstructive pulmonary disease) ([place])   . Coronary atherosclerosis of native coronary artery    a. DES to CX, moderately severe stenosis RCA, mild stenosis LAD 04/2013  . Depression   . Dysphagia, unspecified(787.20)   . GERD (gastroesophageal reflux disease)    Hx Schatzki's ring, multiple EGD/ED last 01/06/2004  . Headache   . Heart disease   . Heart murmur    'a littel'  . HTN (hypertension)    Hx of it  . Hyperlipemia   . Hyperlipidemia   . Internal hemorrhoids without mention of complication   . MI (myocardial infarction) 2006  . Microscopic colitis 2003  . Other and unspecified hyperlipidemia   . Panic disorder without agoraphobia   . Paresthesia    hands, feet  . Pneumonia 12/2011  . PONV (postoperative nausea and vomiting)    'a little nausea"  . PVD (peripheral vascular disease) ([place])   . S/P colonoscopy 09/27/2001   internal hemorrhoids, desc colon inflam polyp, SB BX-chronic duodenitis, colitis  . Shortness of breath   . Sleep disorder    obstructive  . Thyroid disease    recent abnl TSH per pt    Medications:  Prescriptions Prior to Admission  Medication Sig Dispense Refill Last Dose  . acetaminophen (TYLENOL) 500 MG tablet Take 500 mg by mouth daily as needed for headache.    02/15/2016 at Unknown time  . ALPRAZolam (XANAX) 0.25 MG tablet Take 0.25 mg by mouth 2 (two) times daily as needed for anxiety.    02/15/2016 at Unknown time  . aspirin 81 MG EC tablet Take 81 mg by mouth every morning.    02/16/2016 at Unknown time  . carvedilol (COREG) 25 MG tablet Take 12.5 mg by mouth 2 (two) times daily with a meal.    02/16/2016 at 0830  . DULoxetine (CYMBALTA) 60 MG capsule Take 60 mg by mouth at bedtime.    02/15/2016 at Unknown time  . fluticasone (FLONASE) 50 MCG/ACT nasal spray Place 2 sprays into  both nostrils daily.    02/16/2016 at Unknown time  . furosemide (LASIX) 20 MG tablet Take 1 tablet (20 mg total) by mouth daily. 90 tablet 3 02/16/2016 at Unknown time  . ipratropium-albuterol (DUONEB) 0.5-2.5 (3) MG/3ML SOLN Take 3 mLs by nebulization every 6 (six) hours. 360 mL 0 02/16/2016 at Unknown time  . loratadine (CLARITIN) 10 MG tablet Take 10 mg by mouth every morning.   Past Week at Unknown time  . Melatonin 3 MG CAPS Take 1 capsule by mouth at bedtime.   02/15/2016 at Unknown time  . nitroGLYCERIN (NITROSTAT) 0.4 MG SL tablet Place 0.4 mg under the tongue every 5 (five) minutes as needed for chest pain. Reported on 08/04/2015   unknown  . pantoprazole (PROTONIX) 40 MG tablet Take 40 mg by mouth every morning.    02/16/2016 at Unknown time  . potassium chloride (K-DUR,KLOR-CON) 10 MEQ tablet TAKE TWO (2) TABLETS BY MOUTH DAILY. 60 tablet 6 02/16/2016 at Unknown time  . doxycycline (VIBRA-TABS) 100 MG tablet Take 1 tablet (100 mg total) by mouth every 12 (twelve) hours. (Patient not taking: Reported on 02/16/2016) 10 tablet 0 Not Taking at Unknown time  . gabapentin (NEURONTIN) 300 MG capsule Take 1 capsule (300 mg total) by mouth 3 (three) times daily. (Patient not taking: Reported on 02/16/2016) 90 capsule 2 Not Taking at Unknown time    Assessment: 82 yo lady to start eliquis for afib.  She has not been on anticoagulation in the past.  She does have a h/o anemia.  No bleeding reported. Goal of Therapy:  Therapeutic anticoagulation Monitor platelets by anticoagulation protocol: Yes   Plan:  Eliquis 5 mg po bid. Monitor for bleeding complications. Eliquis education  Selda Jalbert, [place] 02/17/2016,11:13 AM

## 2016-02-17 NOTE — Consult Note (Signed)
Primary cardiologist: Dr Kate Sable Consulting cardiologist:Dr Carlyle Dolly Requesting physician: Dr Jay Schlichter Indication: new onset afib  Clinical Summary [name] is a 82 y.o.female history of CAD with prior DES to LCX, chronic chest pain with cath in 11/2015 without new significant disease (she also had a negative CT PE during that admission), chronic diastolic HF, depression/anxiety admitted with chest pain, palpitations, and SOB progressing over the last several days.     Hgb 9.6, Plt 430, Cr 1.28, BUN 16, TSH 3.8, Mg 1.8, BNP 483,  Trop neg x 3 CXR probable pulmonary edema 11/2015 CT PE: negative EKG afib with RVR Allergies  Allergen Reactions  . Cephalosporins Diarrhea and Nausea Only    Lightheaded  . Levaquin [Levofloxacin In D5w] Swelling  . Macrodantin [Nitrofurantoin Macrocrystal] Swelling  . Phenothiazines Anaphylaxis and Hives  . Polysorbate Anaphylaxis  . Prednisone Shortness Of Breath  . Buspirone Itching  . Cardura [Doxazosin Mesylate] Itching  . Codeine Itching  . Acyclovir And Related   . Prochlorperazine Other (See Comments)    unknown  . Ranexa [Ranolazine]     Severe drop in BP  . Atorvastatin Hives    Cramping; tolerates Crestor ok  . Ofloxacin Rash  . Other Itching and Rash    "WOOL"= make skin look like it has been burned  . Penicillins Other (See Comments)    Causes redness all over. Has patient had a PCN reaction causing immediate rash, facial/tongue/throat swelling, SOB or lightheadedness with hypotension: No Has patient had a PCN reaction causing severe rash involving mucus membranes or skin necrosis: No Has patient had a PCN reaction that required hospitalization No Has patient had a PCN reaction occurring within the last 10 years: No If all of the above answers are "NO", then may proceed with Cephalosporin use.   . Pimozide Hives and Itching    Medications Scheduled Medications: . aspirin EC  81 mg Oral q morning - 10a   . carvedilol  12.5 mg Oral BID WC  . DULoxetine  60 mg Oral QHS  . enoxaparin (LOVENOX) injection  40 mg Subcutaneous Q24H  . fluticasone  2 spray Each Nare Daily  . furosemide  40 mg Intravenous Q12H  . guaiFENesin  600 mg Oral BID  . ipratropium-albuterol  3 mL Nebulization TID  . Living Better with Heart Failure Book   Does not apply Once  . loratadine  10 mg Oral q morning - 10a  . pantoprazole  40 mg Oral q morning - 10a  . potassium chloride  20 mEq Oral Daily  . sodium chloride flush  3 mL Intravenous Q12H     Infusions: . diltiazem (CARDIZEM) infusion 5 mg/hr (02/17/16 0711)     PRN Medications:  sodium chloride, acetaminophen, acetaminophen, ALPRAZolam, benzonatate, ipratropium-albuterol, nitroGLYCERIN, ondansetron (ZOFRAN) IV, sodium chloride flush   Past Medical History:  Diagnosis Date  . Anxiety   . Arthritis   . Bursitis    left shoulder  . Cataract   . CHF (congestive heart failure) (HCC)    EF 55-60%  . Complication of anesthesia   . COPD (chronic obstructive pulmonary disease) ([place])   . Coronary atherosclerosis of native coronary artery    a. DES to CX, moderately severe stenosis RCA, mild stenosis LAD 04/2013  . Depression   . Dysphagia, unspecified(787.20)   . GERD (gastroesophageal reflux disease)    Hx Schatzki's ring, multiple EGD/ED last 01/06/2004  . Headache   . Heart disease   .  Heart murmur    'a littel'  . HTN (hypertension)    Hx of it  . Hyperlipemia   . Hyperlipidemia   . Internal hemorrhoids without mention of complication   . MI (myocardial infarction) 2006  . Microscopic colitis 2003  . Other and unspecified hyperlipidemia   . Panic disorder without agoraphobia   . Paresthesia    hands, feet  . Pneumonia 12/2011  . PONV (postoperative nausea and vomiting)    'a little nausea"  . PVD (peripheral vascular disease) ([place])   . S/P colonoscopy 09/27/2001   internal hemorrhoids, desc colon inflam polyp, SB BX-chronic duodenitis,  colitis  . Shortness of breath   . Sleep disorder    obstructive  . Thyroid disease    recent abnl TSH per pt    Past Surgical History:  Procedure Laterality Date  . ABDOMINAL HYSTERECTOMY    . ANTERIOR AND POSTERIOR REPAIR     with resection of vagina  . APPENDECTOMY    . BACK SURGERY    . BIOPSY  07/05/2015   Procedure: BIOPSY;  Surgeon: Daneil Dolin, MD;  Location: [facility];  Service: Endoscopy;;  gastric polyp biopsy, ascending colon biopsy  . BLADDER SUSPENSION  11/09/2011   Procedure: TRANSVAGINAL TAPE (TVT) PROCEDURE;  Surgeon: Marissa Nestle, MD;  Location: [facility];  Service: Urology;  Laterality: N/A;  . bladder tack  06/2010  . BREAST LUMPECTOMY  1998   left, benign  . CARDIAC CATHETERIZATION    . CARDIAC CATHETERIZATION    . CARDIAC CATHETERIZATION N/A 12/16/2015   Procedure: Left Heart Cath and Coronary Angiography;  Surgeon: Troy Sine, MD;  Location: [facility];  Service: Cardiovascular;  Laterality: N/A;  . [place]   left  . CHOLECYSTECTOMY  1998  . COLONOSCOPY  03/16/2011   multiple hyperplastic colon polyps, sigmoid diverticulosis, melanosis coli  . COLONOSCOPY WITH PROPOFOL N/A 07/05/2015   RMR:one 5 mm polyp in descending colon  . CORONARY ANGIOPLASTY WITH STENT PLACEMENT    . ESOPHAGEAL DILATION N/A 07/05/2015   Procedure: ESOPHAGEAL DILATION;  Surgeon: Daneil Dolin, MD;  Location: [facility];  Service: Endoscopy;  Laterality: N/A;  . ESOPHAGOGASTRODUODENOSCOPY (EGD) WITH PROPOFOL N/A 07/05/2015   RWE:RXVQMG  . JOINT REPLACEMENT Right 2007  . left hand surgery    . LEFT HEART CATHETERIZATION WITH CORONARY ANGIOGRAM N/A 05/14/2013   Procedure: LEFT HEART CATHETERIZATION WITH CORONARY ANGIOGRAM;  Surgeon: Blane Ohara, MD;  Location: [facility] CATH LAB;  Service: Cardiovascular;  Laterality: N/A;  . left rotator cuff surgery    . LUMBAR LAMINECTOMY/DECOMPRESSION MICRODISCECTOMY N/A 10/11/2012   Procedure: LUMBAR  LAMINECTOMY/DECOMPRESSION MICRODISCECTOMY 2 LEVELS;  Surgeon: Floyce Stakes, MD;  Location: [facility];  Service: Neurosurgery;  Laterality: N/A;  L3-4 L4-5 Laminectomy  . LUMBAR WOUND DEBRIDEMENT N/A 09/27/2015   Procedure: Exploration of Lumbar Wound w/ Repair CSF Leak/Lumbar Drain Placement;  Surgeon: Leeroy Cha, MD;  Location: [facility] OR[facility];  Service: Neurosurgery;  Laterality: N/A;  . MALONEY DILATION  03/16/2011   Gastritis. No H.pylori on bx. 77F maloney dilation with disruption of  occult cevical esophageal web  . NASAL SINUS SURGERY    . right knee replacement  2007  . right leg benign tumor    . SHOULDER SURGERY Left   . TONSILLECTOMY    . unspecified area, hysterectomy  1972   partial    Family History  Problem Relation Age of Onset  .  Stroke Mother   . Parkinson's disease Father   . Coronary artery disease Other     family Hx-sons  . Cancer Other   . Stroke Other     family Hx  . Hypertension Other     family Hx  . Diabetes Brother   . Heart disease Son     before age 71  . Diabetes Son   . Stroke Daughter 51    Social History [name] reports that she quit smoking about 14 years ago. Her smoking use included Cigarettes. She started smoking about 68 years ago. She has a 64.00 pack-year smoking history. She has never used smokeless tobacco. [name] reports that she does not drink alcohol.  Review of Systems CONSTITUTIONAL: No weight loss, fever, chills, weakness or fatigue.  HEENT: Eyes: No visual loss, blurred vision, double vision or yellow sclerae. No hearing loss, sneezing, congestion, runny nose or sore throat.  SKIN: No rash or itching.  CARDIOVASCULAR: per HPI RESPIRATORY: per HPI GASTROINTESTINAL: No anorexia, nausea, vomiting or diarrhea. No abdominal pain or blood.  GENITOURINARY: no polyuria, no dysuria NEUROLOGICAL: No headache, dizziness, syncope, paralysis, ataxia, numbness or tingling in the extremities. No change in bowel or bladder  control.  MUSCULOSKELETAL: No muscle, back pain, joint pain or stiffness.  HEMATOLOGIC: No anemia, bleeding or bruising.  LYMPHATICS: No enlarged nodes. No history of splenectomy.  PSYCHIATRIC: No history of depression or anxiety.      Physical Examination Blood pressure (!) 116/46, pulse 99, temperature (!) 96.9 F (36.1 C), temperature source Oral, resp. rate (!) 31, height 5\' 1"  (1.549 m), weight 168 lb 3.4 oz (76.3 kg), SpO2 98 %.  Intake/Output Summary (Last 24 hours) at 02/17/16 1005 Last data filed at 02/17/16 0834  Gross per 24 hour  Intake           810.76 ml  Output              700 ml  Net           110.76 ml    HEENT: sclera clear, throat clear  Cardiovascular: RRR, no m/r/g, no jvd  Respiratory: CTAB  GI: abdomen soft, NT, ND  MSK: no LE edema  Neuro: no focal deficits  Psych: appropriate affect   Lab Results  Basic Metabolic Panel:  Recent Labs Lab 02/16/16 1627 02/17/16 0158  NA 136 136  K 4.1 3.5  CL 103 102  CO2 24 25  GLUCOSE 133* 127*  BUN 16 15  CREATININE 1.28* 1.26*  CALCIUM 8.7* 8.6*  MG 1.8  --     Liver Function Tests:  Recent Labs Lab 02/16/16 1627  AST 23  ALT 13*  ALKPHOS 66  BILITOT 0.5  PROT 7.5  ALBUMIN 3.8    CBC:  Recent Labs Lab 02/16/16 1627 02/17/16 0158  WBC 7.5 8.0  NEUTROABS 4.6 4.3  HGB 9.6* 9.5*  HCT 30.5* 30.0*  MCV 81.8 81.5  PLT 430* 394    Cardiac Enzymes:  Recent Labs Lab 02/16/16 1627 02/16/16 1957 02/17/16 0158 02/17/16 0755  TROPONINI <0.03 <0.03 <0.03 <0.03    BNP: Invalid input(s): POCBNP   ECG   Imaging 11/2015 cath  Ost RCA lesion, 80 %stenosed.  Ost Cx to Prox Cx lesion, 0 %stenosed.  Ost LAD to Prox LAD lesion, 20 %stenosed.  1st Diag lesion, 20 %stenosed.  4th Mrg lesion, 20 %stenosed.  The left ventricular systolic function is normal.  LV end diastolic pressure is mildly elevated.  The left ventricular ejection fraction is 60-65% by visual  estimate.   Normal LV function with an ejection fraction of 60-65%.  There is evidence for left ventricular hypertrophy.  Coronary obstructive disease with evidence for smooth eccentric plaque in the proximal LAD of 20%, 20% stenosis in the inferior [name] of the first diagonal vessel; a widely patent previously placed left circumflex stent extending from the ostium to the first marginal takeoff in a large dominant circumflex vessel with 20% distal narrowing; and a small nondominant RCA with 80% ostial stenosis.  RECOMMENDATION: Medical therapy.  Of note, the patient does have significant change in respiratory intrathoracic pressure from inspiratory and expiratory cycles.    12/2015 echo Study Conclusions  - Left ventricle: The cavity size was normal. Wall thickness was   increased in a pattern of mild LVH. The estimated ejection   fraction was 55%. Left ventricular diastolic function parameters   were normal. - Aortic valve: There was mild regurgitation. - Atrial septum: No defect or patent foramen ovale was identified.   Impression/Recommendations 1. Afib - new diagnosis. Started on dilt gtt overnight for rate control. Rates controlled, we will transition to oral dilt 30mg  po q 6 hrs with hold parameters.  - CHADS2Vasc score is 5 (agex2, female, CAD, HTN). Anticoag is indicated. We will start. D/c aspirin.    2. CAD - [name] history of chest pain. Recent cath 11/2015 with nonobstructive disease other than small nondominant 80% RCA, recs for medical therapy at that time. No objective evidence of ischemia this admit by EKG or enzymes.  - no further ischemic testing indicated at this time  3. Acute on chronic diastolic HF - likely exacerbated by afib with RVR - she is on lasix 40mg  IV bid, she has just received her AM dose. I/Os remains positive, follow with iv lasix today.      Carlyle Dolly, M.D.

## 2016-02-17 NOTE — Care Management Note (Signed)
Case Management Note  Patient Details  Name: [name] MRN: [id number] Date of Birth: Jun 28, 1932  Subjective/Objective:                  Pt admitted with CHF and new onset a-fib. Pt is from home, lives with her daughter and is ind with ADL's. She uses a walker with mobility. She also has cane, BSC, and shower stool. She has neb machine but no oxygen. She has PCP and relys on family for transportation. She has no difficulty affording medications. She has used [facility] in the past but is not currently active. She plans to return home at DC. She has been started on Eliquis.   Action/Plan: Pt will need 30-day voucher for Eliquis, PT eval and home O2 assessment prior to DC. CM will cont to follow.   Expected Discharge Date:       02/21/2016           Expected Discharge Plan:  [place]  In-House Referral:  NA  Discharge planning Services  CM Consult  Post Acute Care Choice:  Home Health Choice offered to:  Patient  Status of Service:  In process, will continue to follow   Sherald Barge, RN 02/17/2016, 2:40 PM

## 2016-02-17 NOTE — Progress Notes (Signed)
PROGRESS NOTE    [name]  SKA:[id number] DOB: 1932-08-28 DOA: 02/16/2016 PCP: [name] Specialists:  Gastroenterology: Daneil Dolin, MD    Brief Narrative: 82 year-old female with a hx of HTN, HLD, PAF, diastolic CHF, angina, and CAD s/p stent, presented to the ED with complaints of SOB and CP. While in the ED, Cr 1.28, ALT 13, BNP 483, troponin I was wnl, and Hgb 9.6. CXR showed diffuse interstitial edema. EKG showed A-fib with RVR. She was admitted for further treatment of acute diastolic CHF.   Assessment & Plan:   Principal Problem:   CHF (congestive heart failure) (HCC) Active Problems:   Normocytic anemia   PAF (paroxysmal atrial fibrillation) (HCC)   Thrombocytosis (HCC)   CAD in native artery   Chronic kidney disease, stage III (moderate)   Essential hypertension   COPD exacerbation ([place])   1. Acute diastolic CHF. Patient admits to having SOB and CP. CXR shows large cardiac silhouette with diffuse interstitial edema. Upon admission BNP was 483. Echo recently checked with normal EF. Started on IV lasix and feels breathing is improving today. Continue to monitor intake and output. Continue diuresis for now. 2. Acute COPD exacerbation. Upon admission wheezing was noted on the exam, which has since improved. Continue on DuoNebs QID and prn SOB/Wheezing. 3. A fib with RVR. Chadvasc score = 6. She is already on coreg and was started on cardizem infusion in the ED. Heart rate is better controlled. Will transition to oral cardizem. She has not has been on anticoagulation in the past. No obvious contraindications. Will start on eliquis. 4. Chest pain. Troponin I is <0.03. BNP is 483. EKG shows A-fib. Possibly related to rapid atrial fibrillation. Now improved. Cardiology following. 5. CAD s/p stent. Continue ASA. She reports taking plavix for a full year, but has since discontinued it. 6. Anemia. Hgb was noted to be 9.6 on admission. It has remained  stable over night. No evidence of bleed at this time. Recheck CBC in the am. 7. CKD stage III. Creatine appears to be at baseline. Continue to monitor in the setting of diuresis. 8. Chronic pain. Noted 9. Anxiety, noted. Continue Xanax. 10. GERD. Continue Protonix. 11. Right shoulder pain. Suspect this is related to bursitis. She has had issues with this in the past. We will give a trial of solumedrol  DVT prophylaxis: eliquis Code Status: Full Family Communication: no family present Disposition Plan: Discharge back to SNF once improved   Consultants:   Cardiology  Procedures:   None  Antimicrobials  None   Subjective: Breathing is doing better. No chest pain. Still has pain in her right shoulder that radiates down her arm and up the right side of her neck  Objective: Vitals:   02/17/16 0100 02/17/16 0300 02/17/16 0400 02/17/16 0500  BP: 127/72 140/73 132/86 122/72  Pulse:      Resp: (!) 21 (!) 22 (!) 21 (!) 22  Temp:   98.5 F (36.9 C)   TempSrc:   Oral   SpO2:      Weight:    76.3 kg (168 lb 3.4 oz)  Height:        Intake/Output Summary (Last 24 hours) at 02/17/16 0633 Last data filed at 02/17/16 0500  Gross per 24 hour  Intake           559.84 ml  Output              300 ml  Net  259.84 ml   Filed Weights   02/16/16 1410 02/16/16 2217 02/17/16 0500  Weight: 76.2 kg (168 lb) 75.2 kg (165 lb 12.6 oz) 76.3 kg (168 lb 3.4 oz)    Examination:   General exam: Appears calm and comfortable  Respiratory system: Crackles at bases. Respiratory effort normal. Cardiovascular system: S1 & S2 heard, irregular. No JVD, murmurs, rubs, gallops or clicks. trace pedal edema. Gastrointestinal system: Abdomen is nondistended, soft and nontender. No organomegaly or masses felt. Normal bowel sounds heard. Central nervous system: Alert and oriented. No focal neurological deficits. Extremities: Symmetric 5 x 5 power. Right shoulder is tender to palpation Skin: No  rashes, lesions or ulcers Psychiatry: Judgement and insight appear normal. Mood & affect appropriate.     Data Reviewed: I have personally reviewed following labs and imaging studies  CBC:  Recent Labs Lab 02/16/16 1627 02/17/16 0158  WBC 7.5 8.0  NEUTROABS 4.6 4.3  HGB 9.6* 9.5*  HCT 30.5* 30.0*  MCV 81.8 81.5  PLT 430* 867   Basic Metabolic Panel:  Recent Labs Lab 02/16/16 1627 02/17/16 0158  NA 136 136  K 4.1 3.5  CL 103 102  CO2 24 25  GLUCOSE 133* 127*  BUN 16 15  CREATININE 1.28* 1.26*  CALCIUM 8.7* 8.6*  MG 1.8  --    GFR: Estimated Creatinine Clearance: 32.7 mL/min (by C-G formula based on SCr of 1.26 mg/dL (H)). Liver Function Tests:  Recent Labs Lab 02/16/16 1627  AST 23  ALT 13*  ALKPHOS 66  BILITOT 0.5  PROT 7.5  ALBUMIN 3.8   No results for input(s): LIPASE, AMYLASE in the last 168 hours. No results for input(s): AMMONIA in the last 168 hours. Coagulation Profile: No results for input(s): INR, PROTIME in the last 168 hours. Cardiac Enzymes:  Recent Labs Lab 02/16/16 1627 02/16/16 1957 02/17/16 0158  TROPONINI <0.03 <0.03 <0.03   BNP (last 3 results) No results for input(s): PROBNP in the last 8760 hours. HbA1C: No results for input(s): HGBA1C in the last 72 hours. CBG: No results for input(s): GLUCAP in the last 168 hours. Lipid Profile: No results for input(s): CHOL, HDL, LDLCALC, TRIG, CHOLHDL, LDLDIRECT in the last 72 hours. Thyroid Function Tests:  Recent Labs  02/16/16 1627  TSH 3.871   Anemia Panel: No results for input(s): VITAMINB12, FOLATE, FERRITIN, TIBC, IRON, RETICCTPCT in the last 72 hours. Urine analysis:    Component Value Date/Time   COLORURINE YELLOW 10/27/2015 1345   APPEARANCEUR HAZY (A) 10/27/2015 1345   LABSPEC 1.010 10/27/2015 1345   PHURINE 5.5 10/27/2015 1345   GLUCOSEU NEGATIVE 10/27/2015 1345   HGBUR TRACE (A) 10/27/2015 1345   BILIRUBINUR NEGATIVE 10/27/2015 1345   KETONESUR NEGATIVE  10/27/2015 1345   PROTEINUR NEGATIVE 10/27/2015 1345   UROBILINOGEN 0.2 02/21/2015 2028   NITRITE NEGATIVE 10/27/2015 1345   LEUKOCYTESUR MODERATE (A) 10/27/2015 1345   Sepsis Labs: @LABRCNTIP (procalcitonin:4,lacticidven:4)  ) Recent Results (from the past 240 hour(s))  MRSA PCR Screening     Status: None   Collection Time: 02/16/16 10:12 PM  Result Value Ref Range Status   MRSA by PCR NEGATIVE NEGATIVE Final    Comment:        The GeneXpert MRSA Assay (FDA approved for NASAL specimens only), is one component of a comprehensive MRSA colonization surveillance program. It is not intended to diagnose MRSA infection nor to guide or monitor treatment for MRSA infections.      Radiology Studies: Dg Chest 2 View  Result  Date: 02/16/2016 CLINICAL DATA:  Worsening shortness of breath for 3 weeks, RIGHT shoulder pain for 1 week, history CHF, COPD, hypertension, former smoker, pneumonia, MI EXAM: CHEST  2 VIEW COMPARISON:  12/15/2015 FINDINGS: Enlargement of cardiac silhouette. Atherosclerotic calcification aorta. Diffuse interstitial infiltrates bilaterally increased since previous exam favor pulmonary edema though infection not completely excluded. Tiny pleural effusions blunt the posterior costophrenic angles. No pneumothorax. Diffuse osseous demineralization. IMPRESSION: Enlargement of cardiac silhouette. Diffuse interstitial infiltrates increased since previous exam favoring pulmonary edema. Electronically Signed   By: Lavonia Dana M.D.   On: 02/16/2016 17:17    Scheduled Meds: . aspirin EC  81 mg Oral q morning - 10a  . carvedilol  12.5 mg Oral BID WC  . DULoxetine  60 mg Oral QHS  . enoxaparin (LOVENOX) injection  40 mg Subcutaneous Q24H  . fluticasone  2 spray Each Nare Daily  . furosemide  40 mg Intravenous Q12H  . guaiFENesin  600 mg Oral BID  . ipratropium-albuterol  3 mL Nebulization TID  . Living Better with Heart Failure Book   Does not apply Once  . loratadine  10 mg  Oral q morning - 10a  . pantoprazole  40 mg Oral q morning - 10a  . potassium chloride  20 mEq Oral Daily  . sodium chloride flush  3 mL Intravenous Q12H   Continuous Infusions: . diltiazem (CARDIZEM) infusion 5 mg/hr (02/16/16 2313)     LOS: 1 day    Time spent: 25 minutes  Kathie Dike, MD [facility] If 7PM-7AM, please contact night-coverage [email or web address] Password [facility] 02/17/2016, 6:33 AM

## 2016-02-18 ENCOUNTER — Other Ambulatory Visit: Payer: Self-pay | Admitting: *Deleted

## 2016-02-18 DIAGNOSIS — I5033 Acute on chronic diastolic (congestive) heart failure: Secondary | ICD-10-CM

## 2016-02-18 DIAGNOSIS — I4891 Unspecified atrial fibrillation: Secondary | ICD-10-CM

## 2016-02-18 LAB — CBC
HCT: 29.1 % — ABNORMAL LOW (ref 36.0–46.0)
Hemoglobin: 9.4 g/dL — ABNORMAL LOW (ref 12.0–15.0)
MCH: 26.3 pg (ref 26.0–34.0)
MCHC: 32.3 g/dL (ref 30.0–36.0)
MCV: 81.3 fL (ref 78.0–100.0)
Platelets: 405 K/uL — ABNORMAL HIGH (ref 150–400)
RBC: 3.58 MIL/uL — ABNORMAL LOW (ref 3.87–5.11)
RDW: 16.1 % — ABNORMAL HIGH (ref 11.5–15.5)
WBC: 7.1 K/uL (ref 4.0–10.5)

## 2016-02-18 LAB — BASIC METABOLIC PANEL WITH GFR
Anion gap: 10 (ref 5–15)
BUN: 19 mg/dL (ref 6–20)
CO2: 25 mmol/L (ref 22–32)
Calcium: 8.8 mg/dL — ABNORMAL LOW (ref 8.9–10.3)
Chloride: 100 mmol/L — ABNORMAL LOW (ref 101–111)
Creatinine, Ser: 1.38 mg/dL — ABNORMAL HIGH (ref 0.44–1.00)
GFR calc Af Amer: 40 mL/min — ABNORMAL LOW
GFR calc non Af Amer: 35 mL/min — ABNORMAL LOW
Glucose, Bld: 161 mg/dL — ABNORMAL HIGH (ref 65–99)
Potassium: 4.3 mmol/L (ref 3.5–5.1)
Sodium: 135 mmol/L (ref 135–145)

## 2016-02-18 MED ORDER — DILTIAZEM HCL 60 MG PO TABS
60.0000 mg | ORAL_TABLET | Freq: Four times a day (QID) | ORAL | Status: DC
  Administered 2016-02-18 – 2016-02-19 (×5): 60 mg via ORAL
  Filled 2016-02-18 (×5): qty 1

## 2016-02-18 NOTE — Care Management Important Message (Signed)
Important Message  Patient Details  Name: [name] MRN: [id number] Date of Birth: 03-Aug-1932   Medicare Important Message Given:  Yes    Sherald Barge, RN 02/18/2016, 12:15 PM

## 2016-02-18 NOTE — Care Management Note (Signed)
Case Management Note  Patient Details  Name: [name] MRN: [id number] Date of Birth: 13-Apr-1933  Expected Discharge Date:    02/21/2016              Expected Discharge Plan:  [place]  In-House Referral:  NA  Discharge planning Services  CM Consult  Post Acute Care Choice:  Home Health Choice offered to:  Patient  Status of Service:  In process, will continue to follow  If discussed at Long Length of Stay Meetings, dates discussed:    Additional Comments: Eliquis 30-day voucher placed on pt's chart to be given to her at time of DC. Pt will need home O2 assessment prior to DC. Pt would like to use [facility] for all HH and DME needs.   Sherald Barge, RN 02/18/2016, 12:18 PM

## 2016-02-18 NOTE — Patient Outreach (Signed)
[facility]) Care Management  02/18/2016  DEMIYAH FISCHBACH 06/03/1932 [id number]  CHLOIE LONEY is an 82 year old female with a hx of HTN, HLD, PAF, diastolic CHF, angina, and CAD s/p stent, who recently presented to the ED with complaints of shortness of breath and chest pain. Mrs. Delaney was found to have A-fib with RVR and acute diastolic CHF.   Mrs. Berrian is still admitted for ongoing treatment but has been referred to [place] [facility] for transition of care program assessments and ongoing follow up for disease management and care coordination. I noted that Mrs. Krull will be discharging to home on Eliquis and her inpatient case manager has begun the process of patient assistance by providing her with a 30 day voucher. I will refer Mrs. Arkin to our pharmacy team now in an effort to expedite the process of patient medication assistance after hospital discharge. It is also noted that Mrs. Kintz will likely be discharged to home on home O2 and with home health services.   Plan: I will follow up on Mrs. Riviera's progress on Monday and begin formal transition of care program services within 48 hours of hospital discharge.    [place] [facility]  [phone]

## 2016-02-18 NOTE — Progress Notes (Addendum)
Patient Name: [name] Date of Encounter: 02/18/2016  Primary Cardiologist: Dr. Marca Ancona Problem List     Principal Problem:   CHF (congestive heart failure) (HCC) Active Problems:   Normocytic anemia   PAF (paroxysmal atrial fibrillation) (HCC)   Thrombocytosis (HCC)   CAD in native artery   Chronic kidney disease, stage III (moderate)   Essential hypertension   COPD exacerbation (HCC)     Subjective   Feeling much better. Breathing back to normal.  Inpatient Medications    Scheduled Meds: . apixaban  5 mg Oral BID  . carvedilol  12.5 mg Oral BID WC  . diltiazem  30 mg Oral Q6H  . DULoxetine  60 mg Oral QHS  . fluticasone  2 spray Each Nare Daily  . furosemide  40 mg Intravenous Q12H  . guaiFENesin  600 mg Oral BID  . ipratropium-albuterol  3 mL Nebulization TID  . Living Better with Heart Failure Book   Does not apply Once  . loratadine  10 mg Oral q morning - 10a  . methylPREDNISolone (SOLU-MEDROL) injection  60 mg Intravenous Q12H  . pantoprazole  40 mg Oral q morning - 10a  . potassium chloride  20 mEq Oral Daily  . sodium chloride flush  3 mL Intravenous Q12H   Continuous Infusions:   PRN Meds: sodium chloride, acetaminophen, acetaminophen, ALPRAZolam, benzonatate, ipratropium-albuterol, nitroGLYCERIN, ondansetron (ZOFRAN) IV, sodium chloride flush   Vital Signs    Vitals:   02/18/16 0400 02/18/16 0455 02/18/16 0500 02/18/16 0600  BP:   117/67 (!) 149/93  Pulse:      Resp: (!) 30  18 (!) 21  Temp: 97.8 F (36.6 C)     TempSrc: Axillary     SpO2:      Weight:  168 lb 14 oz (76.6 kg)    Height:        Intake/Output Summary (Last 24 hours) at 02/18/16 0748 Last data filed at 02/18/16 0726  Gross per 24 hour  Intake              840 ml  Output             2200 ml  Net            -1360 ml   Filed Weights   02/16/16 2217 02/17/16 0500 02/18/16 0455  Weight: 165 lb 12.6 oz (75.2 kg) 168 lb 3.4 oz (76.3 kg) 168 lb 14 oz (76.6  kg)    Physical Exam   GEN: Well nourished, well developed, in no acute distress.  HEENT: Grossly normal.  Neck: Supple, no JVD, carotid bruits, or masses. Cardiac: irreg irreg at 100-130/m,  no murmurs, rubs, or gallops. No clubbing, cyanosis, edema.  Radials/DP/PT 2+ and equal bilaterally.  Respiratory:  Respirations regular and unlabored, clear to auscultation bilaterally. GI: Soft, nontender, nondistended, BS + x 4. MS: no deformity or atrophy. Skin: warm and dry, no rash. Psych: AAOx3.  Normal affect.  Labs    CBC  Recent Labs  02/16/16 1627 02/17/16 0158 02/18/16 0526  WBC 7.5 8.0 7.1  NEUTROABS 4.6 4.3  --   HGB 9.6* 9.5* 9.4*  HCT 30.5* 30.0* 29.1*  MCV 81.8 81.5 81.3  PLT 430* 394 353*   Basic Metabolic Panel  Recent Labs  02/16/16 1627 02/17/16 0158 02/18/16 0526  NA 136 136 135  K 4.1 3.5 4.3  CL 103 102 100*  CO2 24 25 25   GLUCOSE 133* 127* 161*  BUN 16 15 19   CREATININE 1.28* 1.26* 1.38*  CALCIUM 8.7* 8.6* 8.8*  MG 1.8  --   --    Liver Function Tests  Recent Labs  02/16/16 1627  AST 23  ALT 13*  ALKPHOS 66  BILITOT 0.5  PROT 7.5  ALBUMIN 3.8   No results for input(s): LIPASE, AMYLASE in the last 72 hours. Cardiac Enzymes  Recent Labs  02/16/16 1957 02/17/16 0158 02/17/16 0755  TROPONINI <0.03 <0.03 <0.03   BNP Invalid input(s): POCBNP D-Dimer No results for input(s): DDIMER in the last 72 hours. Hemoglobin A1C No results for input(s): HGBA1C in the last 72 hours. Fasting Lipid Panel No results for input(s): CHOL, HDL, LDLCALC, TRIG, CHOLHDL, LDLDIRECT in the last 72 hours. Thyroid Function Tests  Recent Labs  02/16/16 1627  TSH 3.871    Telemetry    Afib rates controlled yesterday but now up to 130's at times- Personally Reviewed  ECG      Radiology    Dg Chest 2 View  Result Date: 02/16/2016 CLINICAL DATA:  Worsening shortness of breath for 3 weeks, RIGHT shoulder pain for 1 week, history CHF, COPD,  hypertension, former smoker, pneumonia, MI EXAM: CHEST  2 VIEW COMPARISON:  12/15/2015 FINDINGS: Enlargement of cardiac silhouette. Atherosclerotic calcification aorta. Diffuse interstitial infiltrates bilaterally increased since previous exam favor pulmonary edema though infection not completely excluded. Tiny pleural effusions blunt the posterior costophrenic angles. No pneumothorax. Diffuse osseous demineralization. IMPRESSION: Enlargement of cardiac silhouette. Diffuse interstitial infiltrates increased since previous exam favoring pulmonary edema. Electronically Signed   By: Lavonia Dana M.D.   On: 02/16/2016 17:17    Cardiac Studies    12/2015 echo Study Conclusions   - Left ventricle: The cavity size was normal. Wall thickness was   increased in a pattern of mild LVH. The estimated ejection   fraction was 55%. Left ventricular diastolic function parameters   were normal. - Aortic valve: There was mild regurgitation. - Atrial septum: No defect or patent foramen ovale was identified.   11/2015 cath  Ost RCA lesion, 80 %stenosed.  Ost Cx to Prox Cx lesion, 0 %stenosed.  Ost LAD to Prox LAD lesion, 20 %stenosed.  1st Diag lesion, 20 %stenosed.  4th Mrg lesion, 20 %stenosed.  The left ventricular systolic function is normal.  LV end diastolic pressure is mildly elevated.  The left ventricular ejection fraction is 60-65% by visual estimate.   Normal LV function with an ejection fraction of 60-65%.  There is evidence for left ventricular hypertrophy.   Coronary obstructive disease with evidence for smooth eccentric plaque in the proximal LAD of 20%, 20% stenosis in the inferior Shawanda Sievert of the first diagonal vessel; a widely patent previously placed left circumflex stent extending from the ostium to the first marginal takeoff in a large dominant circumflex vessel with 20% distal narrowing; and a small nondominant RCA with 80% ostial stenosis.   RECOMMENDATION: Medical therapy.  Of  note, the patient does have significant change in respiratory intrathoracic pressure from inspiratory and expiratory cycles.     Patient Profile     82 year old female with history of DES to the circumflex, chronic chest pain and cath in 11/2015 without new significant disease, chronic diastolic heart failure admitted with new atrial fibrillation and CHF.  Assessment & Plan      1. Afib - new diagnosis.IV dilt gtt stopped but Rates now up on oral dilt 30mg  po q 6 hrs with hold parameters. Will increase to 60 mg  q 6 hrs. - CHADS2Vasc score is 5 (agex2, female, CAD, HTN). Eliquis started. .      2. CAD - long history of chest pain. Recent cath 11/2015 with nonobstructive disease other than small nondominant 80% RCA, recs for medical therapy at that time. No objective evidence of ischemia this admit by EKG or enzymes.  - no further ischemic testing indicated at this time   3. Acute on chronic diastolic HF resolved - likely exacerbated by afib with RVR - she is on lasix 40mg  IV bid, . I/Os -1360    Signed, Ermalinda Barrios, PA-C  02/18/2016, 7:48 AM   Attending note Patient seen and discussed with PA Bonnell Public, I agree with her documentation above. Off dilt gtt, now on oral dilt and coreg. Rates remain elevated, will increase dilt to 60mg  po q6hrs, once rates controlled change to long acting dilt. Acute on chronic diastolic HF likely exacerbated by afib with RVR. On lasix IV 40mg  bid, negative 1.5 liters since admission. Uptrend in Cr, will d/c IV lasix (she did receive AM dose), would restart her home lasix 20mg  daily tomorrow. Started on eliquis for stroke prevention in setting of afib. COPD exacerbation per primary team.    Carlyle Dolly MD

## 2016-02-18 NOTE — Discharge Instructions (Signed)

## 2016-02-18 NOTE — Progress Notes (Signed)
PROGRESS NOTE    CHRYSTIAN RESSLER  PIR:[id number] DOB: 1932-10-21 DOA: 02/16/2016 PCP: [name]    Brief Narrative:  82 year-old female with a hx of HTN, HLD, PAF, diastolic CHF, angina, and CAD s/p stent, presented to the ED with complaints of shortness of breath and chest pain.  While in the ED, Cr 1.28, ALT 13, BNP 483, and Hemoglobin 9.6. CXR showed diffuse interstitial edema. EKG showed A-fib with RVR. She was started on lasix and diltiazem drip and admitted for further treatment of acute diastolic CHF.  Assessment & Plan:   Principal Problem:   CHF (congestive heart failure) (HCC) Active Problems:   Normocytic anemia   PAF (paroxysmal atrial fibrillation) (HCC)   Thrombocytosis (HCC)   CAD in native artery   Chronic kidney disease, stage III (moderate)   Essential hypertension   COPD exacerbation ([place])  1. Acute diastolic CHF. Patient admits to having shortness of breath and chest pain. CXR shows a  large cardiac silhouette with diffuse interstitial edema. Upon admission BNP was 483. Recent echocardiogram showed an normal EF range. She was started on IV lasix and feels as if her breathing is improving. Continue to monitor intake and output. Continue diuresis for now. 2. Acute COPD exacerbation. Upon admission wheezing was noted on the exam, which has since improved. Continue on DuoNebs QID.  3. A fib with RVR. Chadvasc score = 6. She is already on coreg and was started on cardizem infusion in the ED. Heart rate is better controlled. She has now been transitioned to oral cardizem. She has not has been on anticoagulation in the past. No obvious contraindications. Started on eliquis. 4. Chest pain. Troponin I is <0.03. BNP is 483. EKG shows A-fib. Possibly related to rapid atrial fibrillation. Now improved. Cardiology following. 5. CAD s/p stent. Continue ASA. She reports taking plavix for a full year, but has since discontinued it. 6. Anemia. Hgb was noted to be 9.6 on  admission. It has remained stable over night. No evidence of bleed at this time. Recheck CBC in the am. 7. CKD stage III. Creatine appears to be at baseline. Continue to monitor in the setting of diuresis. 8. Chronic pain. Noted 9. Anxiety, noted. Continue Xanax. 10. GERD. Continue Protonix. 11. Right shoulder pain. Suspect this is related to bursitis. She has had issues with this in the past. Started on solumedrol with improvement  DVT prophylaxis: Eliquis  Code Status: FULL  Family Communication: No family bedside Disposition Plan: Discharge back to SNF once improved.    Consultants:   Cardiology   Procedures:   None   Antimicrobials:  None    Subjective: Feeling better. Right shoulder pain improving. Shortness of breath improving  Objective: Vitals:   02/18/16 0400 02/18/16 0455 02/18/16 0500 02/18/16 0600  BP:   117/67 (!) 149/93  Pulse:      Resp: (!) 30  18 (!) 21  Temp: 97.8 F (36.6 C)     TempSrc: Axillary     SpO2:      Weight:  76.6 kg (168 lb 14 oz)    Height:        Intake/Output Summary (Last 24 hours) at 02/18/16 0636 Last data filed at 02/17/16 2322  Gross per 24 hour  Intake           850.92 ml  Output             2200 ml  Net         -1349.08 ml  Filed Weights   02/16/16 2217 02/17/16 0500 02/18/16 0455  Weight: 75.2 kg (165 lb 12.6 oz) 76.3 kg (168 lb 3.4 oz) 76.6 kg (168 lb 14 oz)    Examination:  General exam: Appears calm and comfortable  Respiratory system: Crackles at bases. Respiratory effort normal. Cardiovascular system: S1 & S2 heard, irregular. No JVD, murmurs, rubs, gallops or clicks. No pedal edema. Gastrointestinal system: Abdomen is nondistended, soft and nontender. No organomegaly or masses felt. Normal bowel sounds heard. Central nervous system: Alert and oriented. No focal neurological deficits. Extremities: Symmetric 5 x 5 power. Skin: No rashes, lesions or ulcers Psychiatry: Judgement and insight appear normal.  Mood & affect appropriate.     Data Reviewed: I have personally reviewed following labs and imaging studies  CBC:  Recent Labs Lab 02/16/16 1627 02/17/16 0158 02/18/16 0526  WBC 7.5 8.0 7.1  NEUTROABS 4.6 4.3  --   HGB 9.6* 9.5* 9.4*  HCT 30.5* 30.0* 29.1*  MCV 81.8 81.5 81.3  PLT 430* 394 578*   Basic Metabolic Panel:  Recent Labs Lab 02/16/16 1627 02/17/16 0158 02/18/16 0526  NA 136 136 135  K 4.1 3.5 4.3  CL 103 102 100*  CO2 24 25 25   GLUCOSE 133* 127* 161*  BUN 16 15 19   CREATININE 1.28* 1.26* 1.38*  CALCIUM 8.7* 8.6* 8.8*  MG 1.8  --   --    GFR: Estimated Creatinine Clearance: 29.9 mL/min (by C-G formula based on SCr of 1.38 mg/dL (H)). Liver Function Tests:  Recent Labs Lab 02/16/16 1627  AST 23  ALT 13*  ALKPHOS 66  BILITOT 0.5  PROT 7.5  ALBUMIN 3.8   No results for input(s): LIPASE, AMYLASE in the last 168 hours. No results for input(s): AMMONIA in the last 168 hours. Coagulation Profile: No results for input(s): INR, PROTIME in the last 168 hours. Cardiac Enzymes:  Recent Labs Lab 02/16/16 1627 02/16/16 1957 02/17/16 0158 02/17/16 0755  TROPONINI <0.03 <0.03 <0.03 <0.03   BNP (last 3 results) No results for input(s): PROBNP in the last 8760 hours. HbA1C: No results for input(s): HGBA1C in the last 72 hours. CBG: No results for input(s): GLUCAP in the last 168 hours. Lipid Profile: No results for input(s): CHOL, HDL, LDLCALC, TRIG, CHOLHDL, LDLDIRECT in the last 72 hours. Thyroid Function Tests:  Recent Labs  02/16/16 1627  TSH 3.871   Anemia Panel: No results for input(s): VITAMINB12, FOLATE, FERRITIN, TIBC, IRON, RETICCTPCT in the last 72 hours. Sepsis Labs: No results for input(s): PROCALCITON, LATICACIDVEN in the last 168 hours.  Recent Results (from the past 240 hour(s))  MRSA PCR Screening     Status: None   Collection Time: 02/16/16 10:12 PM  Result Value Ref Range Status   MRSA by PCR NEGATIVE NEGATIVE Final      Comment:        The GeneXpert MRSA Assay (FDA approved for NASAL specimens only), is one component of a comprehensive MRSA colonization surveillance program. It is not intended to diagnose MRSA infection nor to guide or monitor treatment for MRSA infections.          Radiology Studies: Dg Chest 2 View  Result Date: 02/16/2016 CLINICAL DATA:  Worsening shortness of breath for 3 weeks, RIGHT shoulder pain for 1 week, history CHF, COPD, hypertension, former smoker, pneumonia, MI EXAM: CHEST  2 VIEW COMPARISON:  12/15/2015 FINDINGS: Enlargement of cardiac silhouette. Atherosclerotic calcification aorta. Diffuse interstitial infiltrates bilaterally increased since previous exam favor pulmonary edema though  infection not completely excluded. Tiny pleural effusions blunt the posterior costophrenic angles. No pneumothorax. Diffuse osseous demineralization. IMPRESSION: Enlargement of cardiac silhouette. Diffuse interstitial infiltrates increased since previous exam favoring pulmonary edema. Electronically Signed   By: Lavonia Dana M.D.   On: 02/16/2016 17:17        Scheduled Meds: . apixaban  5 mg Oral BID  . carvedilol  12.5 mg Oral BID WC  . diltiazem  30 mg Oral Q6H  . DULoxetine  60 mg Oral QHS  . fluticasone  2 spray Each Nare Daily  . furosemide  40 mg Intravenous Q12H  . guaiFENesin  600 mg Oral BID  . ipratropium-albuterol  3 mL Nebulization TID  . Living Better with Heart Failure Book   Does not apply Once  . loratadine  10 mg Oral q morning - 10a  . methylPREDNISolone (SOLU-MEDROL) injection  60 mg Intravenous Q12H  . pantoprazole  40 mg Oral q morning - 10a  . potassium chloride  20 mEq Oral Daily  . sodium chloride flush  3 mL Intravenous Q12H   Continuous Infusions:    LOS: 2 days    Time spent: 25 minutes    Kathie Dike, MD [facility] If 7PM-7AM, please contact night-coverage [email or web address] Password [facility] 02/18/2016, 6:36 AM

## 2016-02-19 ENCOUNTER — Inpatient Hospital Stay (HOSPITAL_COMMUNITY): Payer: Medicare Other

## 2016-02-19 DIAGNOSIS — I4891 Unspecified atrial fibrillation: Secondary | ICD-10-CM

## 2016-02-19 LAB — CBC
HCT: 29.6 % — ABNORMAL LOW (ref 36.0–46.0)
HEMOGLOBIN: 9.4 g/dL — AB (ref 12.0–15.0)
MCH: 26 pg (ref 26.0–34.0)
MCHC: 31.8 g/dL (ref 30.0–36.0)
MCV: 81.8 fL (ref 78.0–100.0)
Platelets: 422 10*3/uL — ABNORMAL HIGH (ref 150–400)
RBC: 3.62 MIL/uL — AB (ref 3.87–5.11)
RDW: 16.4 % — ABNORMAL HIGH (ref 11.5–15.5)
WBC: 9.1 10*3/uL (ref 4.0–10.5)

## 2016-02-19 LAB — BASIC METABOLIC PANEL
ANION GAP: 10 (ref 5–15)
BUN: 33 mg/dL — ABNORMAL HIGH (ref 6–20)
CALCIUM: 9 mg/dL (ref 8.9–10.3)
CO2: 25 mmol/L (ref 22–32)
Chloride: 98 mmol/L — ABNORMAL LOW (ref 101–111)
Creatinine, Ser: 1.42 mg/dL — ABNORMAL HIGH (ref 0.44–1.00)
GFR, EST AFRICAN AMERICAN: 39 mL/min — AB (ref >60)
GFR, EST NON AFRICAN AMERICAN: 34 mL/min — AB (ref >60)
Glucose, Bld: 162 mg/dL — ABNORMAL HIGH (ref 65–99)
Potassium: 4.6 mmol/L (ref 3.5–5.1)
Sodium: 133 mmol/L — ABNORMAL LOW (ref 135–145)

## 2016-02-19 MED ORDER — DILTIAZEM HCL ER COATED BEADS 240 MG PO CP24
240.0000 mg | ORAL_CAPSULE | Freq: Every day | ORAL | Status: DC
  Administered 2016-02-19 – 2016-02-20 (×2): 240 mg via ORAL
  Filled 2016-02-19 (×2): qty 1

## 2016-02-19 MED ORDER — HYDROCODONE-ACETAMINOPHEN 5-325 MG PO TABS
1.0000 | ORAL_TABLET | ORAL | Status: DC | PRN
  Administered 2016-02-19: 1 via ORAL
  Filled 2016-02-19: qty 1

## 2016-02-19 MED ORDER — FUROSEMIDE 20 MG PO TABS
20.0000 mg | ORAL_TABLET | Freq: Every day | ORAL | Status: DC
  Administered 2016-02-20: 20 mg via ORAL
  Filled 2016-02-19: qty 1

## 2016-02-19 NOTE — Progress Notes (Signed)
PROGRESS NOTE    [name]  CXK:[id number] DOB: [birth year] DOA: 02/16/2016 PCP: [name]    Brief Narrative:  82 year-old female with a hx of HTN, HLD, PAF, diastolic CHF, angina, and CAD s/p stent, presented to the ED with complaints of shortness of breath and chest pain.  While in the ED, Cr 1.28, ALT 13, BNP 483, and Hemoglobin 9.6. CXR showed diffuse interstitial edema. EKG showed A-fib with RVR. She was started on lasix and diltiazem drip and admitted for further treatment of acute diastolic CHF.  Assessment & Plan:   Principal Problem:   CHF (congestive heart failure) (HCC) Active Problems:   Normocytic anemia   PAF (paroxysmal atrial fibrillation) (HCC)   Thrombocytosis (HCC)   CAD in native artery   Chronic kidney disease, stage III (moderate)   Essential hypertension   COPD exacerbation (HCC)   Acute on chronic diastolic congestive heart failure (HCC)   Atrial fibrillation ([place])  1. Acute on chronic diastolic CHF. Patient admits to having shortness of breath and chest pain. CXR showed a  large cardiac silhouette with diffuse interstitial edema. Upon admission BNP was 483. Recent echocardiogram showed a normal EF range. She was started on IV lasix and feels as if her breathing has significantly improved. Creatinine has been trending up. Patient transitioned to oral lasix. 2. Acute COPD exacerbation. Upon admission wheezing was noted on the exam, which has since improved. Continue on DuoNebs.  3. A fib with RVR. Chadvasc score = 6. She is already on coreg and was started on cardizem infusion in the ED. Heart rate is better controlled. She has now been transitioned to oral cardizem. She has not has been on anticoagulation in the past. No obvious contraindications. Started on eliquis. Transition cardizem to long acting 4. Chest pain. Troponin I is <0.03. BNP is 483. EKG shows A-fib. Possibly related to rapid atrial fibrillation. Now improved. Cardiology  following. 5. CAD s/p stent. Continue ASA. She reports taking plavix for a full year, but has since discontinued it. 6. Normocytic anemia. Hgb was noted to be 9.6 on admission. It has remained stable over night. No evidence of bleed at this time. Recheck CBC in the am. 7. CKD stage III. Creatinine appears to be at baseline. Continue to monitor in the setting of diuresis. 8. Chronic pain. Noted 9. Anxiety, noted. Continue Xanax. 10. GERD. Continue Protonix. 11. Right shoulder pain. Suspect this is related to bursitis. She has had issues with this in the past. Started on solumedrol with improvement. Check xray right shoulder.   DVT prophylaxis: Eliquis  Code Status: FULL  Family Communication: discussed with patient Disposition Plan: Discharge back to SNF once improved.    Consultants:   Cardiology  Physical Therapist  Procedures:   None   Antimicrobials:  None    Subjective: Complains of right shoulder pain. Shortness of breath has improved  Objective: Vitals:   02/18/16 1500 02/18/16 1847 02/18/16 2015 02/18/16 2205  BP:  (!) 134/47  (!) 118/53  Pulse:  70  86  Resp:  18  20  Temp:    97.8 F (36.6 C)  TempSrc:    Oral  SpO2: 96%  94% 96%  Weight:      Height:        Intake/Output Summary (Last 24 hours) at 02/19/16 0629 Last data filed at 02/18/16 1223  Gross per 24 hour  Intake              483 ml  Output              825 ml  Net             -342 ml   Filed Weights   02/16/16 2217 02/17/16 0500 02/18/16 0455  Weight: 75.2 kg (165 lb 12.6 oz) 76.3 kg (168 lb 3.4 oz) 76.6 kg (168 lb 14 oz)    Examination:  General exam: Appears calm and comfortable  Respiratory system: Clear to auscultation. Respiratory effort normal. Cardiovascular system: S1 & S2 heard, irregular. No JVD, murmurs, rubs, gallops or clicks. No pedal edema. Gastrointestinal system: Abdomen is nondistended, soft and nontender. No organomegaly or masses felt. Normal bowel sounds  heard. Central nervous system: Alert and oriented. No focal neurological deficits. Extremities: Symmetric 5 x 5 power. Tenderness to palpation over right shoulder Skin: No rashes, lesions or ulcers Psychiatry: Judgement and insight appear normal. Mood & affect appropriate.     Data Reviewed: I have personally reviewed following labs and imaging studies  CBC:  Recent Labs Lab 02/16/16 1627 02/17/16 0158 02/18/16 0526  WBC 7.5 8.0 7.1  NEUTROABS 4.6 4.3  --   HGB 9.6* 9.5* 9.4*  HCT 30.5* 30.0* 29.1*  MCV 81.8 81.5 81.3  PLT 430* 394 973*   Basic Metabolic Panel:  Recent Labs Lab 02/16/16 1627 02/17/16 0158 02/18/16 0526  NA 136 136 135  K 4.1 3.5 4.3  CL 103 102 100*  CO2 24 25 25   GLUCOSE 133* 127* 161*  BUN 16 15 19   CREATININE 1.28* 1.26* 1.38*  CALCIUM 8.7* 8.6* 8.8*  MG 1.8  --   --    GFR: Estimated Creatinine Clearance: 29.9 mL/min (by C-G formula based on SCr of 1.38 mg/dL (H)). Liver Function Tests:  Recent Labs Lab 02/16/16 1627  AST 23  ALT 13*  ALKPHOS 66  BILITOT 0.5  PROT 7.5  ALBUMIN 3.8   No results for input(s): LIPASE, AMYLASE in the last 168 hours. No results for input(s): AMMONIA in the last 168 hours. Coagulation Profile: No results for input(s): INR, PROTIME in the last 168 hours. Cardiac Enzymes:  Recent Labs Lab 02/16/16 1627 02/16/16 1957 02/17/16 0158 02/17/16 0755  TROPONINI <0.03 <0.03 <0.03 <0.03   BNP (last 3 results) No results for input(s): PROBNP in the last 8760 hours. HbA1C: No results for input(s): HGBA1C in the last 72 hours. CBG: No results for input(s): GLUCAP in the last 168 hours. Lipid Profile: No results for input(s): CHOL, HDL, LDLCALC, TRIG, CHOLHDL, LDLDIRECT in the last 72 hours. Thyroid Function Tests:  Recent Labs  02/16/16 1627  TSH 3.871   Anemia Panel: No results for input(s): VITAMINB12, FOLATE, FERRITIN, TIBC, IRON, RETICCTPCT in the last 72 hours. Sepsis Labs: No results for  input(s): PROCALCITON, LATICACIDVEN in the last 168 hours.  Recent Results (from the past 240 hour(s))  MRSA PCR Screening     Status: None   Collection Time: 02/16/16 10:12 PM  Result Value Ref Range Status   MRSA by PCR NEGATIVE NEGATIVE Final    Comment:        The GeneXpert MRSA Assay (FDA approved for NASAL specimens only), is one component of a comprehensive MRSA colonization surveillance program. It is not intended to diagnose MRSA infection nor to guide or monitor treatment for MRSA infections.      Radiology Studies: No results found.   Scheduled Meds: . apixaban  5 mg Oral BID  . carvedilol  12.5 mg Oral BID WC  .  diltiazem  60 mg Oral Q6H  . DULoxetine  60 mg Oral QHS  . fluticasone  2 spray Each Nare Daily  . guaiFENesin  600 mg Oral BID  . Living Better with Heart Failure Book   Does not apply Once  . loratadine  10 mg Oral q morning - 10a  . methylPREDNISolone (SOLU-MEDROL) injection  60 mg Intravenous Q12H  . pantoprazole  40 mg Oral q morning - 10a  . potassium chloride  20 mEq Oral Daily  . sodium chloride flush  3 mL Intravenous Q12H   Continuous Infusions:    LOS: 3 days    Time spent: 25 minutes    Kathie Dike, MD [facility] If 7PM-7AM, please contact night-coverage [email or web address] Password [facility] 02/19/2016, 6:29 AM

## 2016-02-19 NOTE — Progress Notes (Addendum)
Pt had a 6 beat run of v-tach.  Md notified...  Md added a mag level onto AM labs with instructions to observe pt closely.

## 2016-02-20 LAB — BASIC METABOLIC PANEL
Anion gap: 9 (ref 5–15)
BUN: 45 mg/dL — AB (ref 6–20)
CO2: 25 mmol/L (ref 22–32)
Calcium: 8.9 mg/dL (ref 8.9–10.3)
Chloride: 97 mmol/L — ABNORMAL LOW (ref 101–111)
Creatinine, Ser: 1.43 mg/dL — ABNORMAL HIGH (ref 0.44–1.00)
GFR calc Af Amer: 39 mL/min — ABNORMAL LOW (ref >60)
GFR, EST NON AFRICAN AMERICAN: 33 mL/min — AB (ref >60)
GLUCOSE: 152 mg/dL — AB (ref 65–99)
POTASSIUM: 4.7 mmol/L (ref 3.5–5.1)
Sodium: 131 mmol/L — ABNORMAL LOW (ref 135–145)

## 2016-02-20 LAB — MAGNESIUM: MAGNESIUM: 2.3 mg/dL (ref 1.7–2.4)

## 2016-02-20 MED ORDER — HYDROCODONE-ACETAMINOPHEN 5-325 MG PO TABS: 1.0000 | ORAL_TABLET | Freq: Four times a day (QID) | ORAL | 0 refills | Status: DC | PRN

## 2016-02-20 MED ORDER — APIXABAN 5 MG PO TABS: 5.0000 mg | ORAL_TABLET | Freq: Two times a day (BID) | ORAL | 0 refills | Status: DC

## 2016-02-20 MED ORDER — DILTIAZEM HCL ER COATED BEADS 240 MG PO CP24: 240.0000 mg | ORAL_CAPSULE | Freq: Every day | ORAL | 0 refills | Status: DC

## 2016-02-20 NOTE — Discharge Summary (Signed)
Physician Discharge Summary  [name] HYI:[id number] DOB: 1933/01/08 DOA: 02/16/2016  PCP: [name]  Admit date: 02/16/2016 Discharge date: 02/20/2016  Admitted From: home Disposition:  home  Recommendations for Outpatient Follow-up:  1. Follow up with PCP in 1-2 weeks 2. Please obtain BMP/CBC in one week   Home Health: Equipment/Devices:  Discharge Condition: stable CODE STATUS: full Diet recommendation: Heart Healthy  Brief/Interim Summary: 82 year-old female with a hx of HTN, HLD, PAF, diastolic CHF, angina, and CAD s/p stent, presented to the ED with complaints of shortness of breath and chest pain.  While in the ED, Cr 1.28, ALT 13, BNP 483, and Hemoglobin 9.6. CXR showed diffuse interstitial edema. EKG showed A-fib with RVR. She was started on lasix and diltiazem drip and admitted for further treatment of acute diastolic CHF.  Discharge Diagnoses:  Principal Problem:   CHF (congestive heart failure) (HCC) Active Problems:   Normocytic anemia   PAF (paroxysmal atrial fibrillation) (HCC)   Thrombocytosis (HCC)   CAD in native artery   Chronic kidney disease, stage III (moderate)   Essential hypertension   COPD exacerbation (HCC)   Acute on chronic diastolic congestive heart failure (HCC)   Atrial fibrillation ([place])  1. Acute on chronic diastolic CHF. Patient admits to having shortness of breath and chest pain. CXR showed a  large cardiac silhouette with diffuse interstitial edema. Upon admission BNP was 483. Recent echocardiogram showed a normal EF range. She was started on IV lasix, has had good diuresis and breathing has significantly improved. Patient transitioned to oral lasix. 2. Acute COPD exacerbation. Upon admission wheezing was noted on the exam, which has since improved. Continue on DuoNebs.  3. A fib with RVR. Chadvasc score = 6. She is already on coreg and was started on cardizem infusion in the ED. Heart rate is better controlled. She has now  been transitioned to long acting oral cardizem. She has not has been on anticoagulation in the past. No obvious contraindications. Started on eliquis.  4. Chest pain. Troponin I is <0.03. BNP is 483. EKG shows A-fib. Possibly related to rapid atrial fibrillation. Now improved. Cardiology following. 5. CAD s/p stent. Continue ASA. She reports taking plavix for a full year, but has since discontinued it. 6. Normocytic anemia. Hgb was noted to be 9.6 on admission. It has remained stable over night. No evidence of bleed at this time.  7. CKD stage III. Creatinine appears to be at baseline. . 8. Chronic pain. Noted 9. Anxiety, noted. Continue Xanax. 10. GERD. Continue Protonix. 11. Right shoulder pain. Suspect this is related to bursitis. She has had issues with this in the past. Xray of right shoulder showed degenerative disease. She was treated with a course of solumedrol with some improvement. Continue pain management. Overall she is feeling better.[name] Kitchen  Discharge Instructions  Discharge Instructions    AMB Referral to [facility]    Complete by:  As directed    Please refer to [facility] for TOC.  Consent obtained at bedside. Alternate contact is daughter, [name]: [phone].  Patient with 4 admissions in 6 months and hx of HF. Call with questions: Royetta Crochet. Laymond Purser, RN, BSN, CCM  [facility] [phone]   Reason for consult:  [facility] consult   Diagnoses of:  Heart Failure   Expected date of contact:  1-3 days (reserved for hospital discharges)   Diet - low sodium heart healthy    Complete by:  As directed  Increase activity slowly    Complete by:  As directed        Medication List    STOP taking these medications   doxycycline 100 MG tablet Commonly known as:  VIBRA-TABS     TAKE these medications   acetaminophen 500 MG tablet Commonly known as:  TYLENOL Take 500 mg by mouth daily as needed for headache.   ALPRAZolam 0.25 MG tablet Commonly  known as:  XANAX Take 0.25 mg by mouth 2 (two) times daily as needed for anxiety.   apixaban 5 MG Tabs tablet Commonly known as:  ELIQUIS Take 1 tablet (5 mg total) by mouth 2 (two) times daily.   aspirin 81 MG EC tablet Take 81 mg by mouth every morning.   carvedilol 25 MG tablet Commonly known as:  COREG Take 12.5 mg by mouth 2 (two) times daily with a meal.   diltiazem 240 MG 24 hr capsule Commonly known as:  CARDIZEM CD Take 1 capsule (240 mg total) by mouth daily. Start taking on:  02/21/2016   DULoxetine 60 MG capsule Commonly known as:  CYMBALTA Take 60 mg by mouth at bedtime.   fluticasone 50 MCG/ACT nasal spray Commonly known as:  FLONASE Place 2 sprays into both nostrils daily.   furosemide 20 MG tablet Commonly known as:  LASIX Take 1 tablet (20 mg total) by mouth daily.   gabapentin 300 MG capsule Commonly known as:  NEURONTIN Take 1 capsule (300 mg total) by mouth 3 (three) times daily.   HYDROcodone-acetaminophen 5-325 MG tablet Commonly known as:  NORCO/VICODIN Take 1 tablet by mouth every 6 (six) hours as needed for severe pain.   ipratropium-albuterol 0.5-2.5 (3) MG/3ML Soln Commonly known as:  DUONEB Take 3 mLs by nebulization every 6 (six) hours.   loratadine 10 MG tablet Commonly known as:  CLARITIN Take 10 mg by mouth every morning.   Melatonin 3 MG Caps Take 1 capsule by mouth at bedtime.   nitroGLYCERIN 0.4 MG SL tablet Commonly known as:  NITROSTAT Place 0.4 mg under the tongue every 5 (five) minutes as needed for chest pain. Reported on 08/04/2015   pantoprazole 40 MG tablet Commonly known as:  PROTONIX Take 40 mg by mouth every morning.   potassium chloride 10 MEQ tablet Commonly known as:  K-DUR,KLOR-CON TAKE TWO (2) TABLETS BY MOUTH DAILY.       Allergies  Allergen Reactions  . Cephalosporins Diarrhea and Nausea Only    Lightheaded  . Levaquin [Levofloxacin In D5w] Swelling  . Macrodantin [Nitrofurantoin Macrocrystal]  Swelling  . Phenothiazines Anaphylaxis and Hives  . Polysorbate Anaphylaxis  . Prednisone Shortness Of Breath  . Buspirone Itching  . Cardura [Doxazosin Mesylate] Itching  . Codeine Itching  . Acyclovir And Related   . Prochlorperazine Other (See Comments)    unknown  . Ranexa [Ranolazine]     Severe drop in BP  . Atorvastatin Hives    Cramping; tolerates Crestor ok  . Ofloxacin Rash  . Other Itching and Rash    "WOOL"= make skin look like it has been burned  . Penicillins Other (See Comments)    Causes redness all over. Has patient had a PCN reaction causing immediate rash, facial/tongue/throat swelling, SOB or lightheadedness with hypotension: No Has patient had a PCN reaction causing severe rash involving mucus membranes or skin necrosis: No Has patient had a PCN reaction that required hospitalization No Has patient had a PCN reaction occurring within the last 10 years: No If  all of the above answers are "NO", then may proceed with Cephalosporin use.   . Pimozide Hives and Itching    Consultations:  cardiology   Procedures/Studies: Dg Chest 2 View  Result Date: 02/16/2016 CLINICAL DATA:  Worsening shortness of breath for 3 weeks, RIGHT shoulder pain for 1 week, history CHF, COPD, hypertension, former smoker, pneumonia, MI EXAM: CHEST  2 VIEW COMPARISON:  12/15/2015 FINDINGS: Enlargement of cardiac silhouette. Atherosclerotic calcification aorta. Diffuse interstitial infiltrates bilaterally increased since previous exam favor pulmonary edema though infection not completely excluded. Tiny pleural effusions blunt the posterior costophrenic angles. No pneumothorax. Diffuse osseous demineralization. IMPRESSION: Enlargement of cardiac silhouette. Diffuse interstitial infiltrates increased since previous exam favoring pulmonary edema. Electronically Signed   By: Lavonia Dana M.D.   On: 02/16/2016 17:17   Dg Shoulder Right  Result Date: 02/19/2016 CLINICAL DATA:  Right shoulder pain  for 2 weeks increasing today to severe, no injury, past history of bursitis EXAM: RIGHT SHOULDER - 2+ VIEW COMPARISON:  None. FINDINGS: Mild right acromioclavicular joint degenerative change. No acute fracture or dislocation. Visualized portion of the right hemithorax is normal. IMPRESSION: Degenerative change, without acute osseous finding. Electronically Signed   By: Abigail Miyamoto M.D.   On: 02/19/2016 18:32    Echo: - Left ventricle: The cavity size was normal. Wall thickness was   increased in a pattern of mild LVH. The estimated ejection   fraction was 55%. Left ventricular diastolic function parameters   were normal. - Aortic valve: There was mild regurgitation. - Atrial septum: No defect or patent foramen ovale was identified.   Subjective: Patient is feeling better today. Right shoulder is less painful. No shortness of breath  Discharge Exam: Vitals:   02/19/16 2159 02/20/16 0632  BP: (!) 120/56 (!) 124/55  Pulse: 80 64  Resp: 20 20  Temp: 98.1 F (36.7 C) 97.7 F (36.5 C)   Vitals:   02/19/16 0624 02/19/16 1424 02/19/16 2159 02/20/16 0632  BP: (!) 148/74 (!) 128/59 (!) 120/56 (!) 124/55  Pulse: 79 71 80 64  Resp: 20 20 20 20   Temp: 97.4 F (36.3 C) 98.2 F (36.8 C) 98.1 F (36.7 C) 97.7 F (36.5 C)  TempSrc: Oral Oral Oral Oral  SpO2: 95% 99% 94% 97%  Weight: 76.4 kg (168 lb 6.4 oz)   76.4 kg (168 lb 6.4 oz)  Height:        General: Pt is alert, awake, not in acute distress Cardiovascular: irregular, S1/S2 +, no rubs, no gallops Respiratory: CTA bilaterally, no wheezing, no rhonchi Abdominal: Soft, NT, ND, bowel sounds + Extremities: no edema, no cyanosis    The results of significant diagnostics from this hospitalization (including imaging, microbiology, ancillary and laboratory) are listed below for reference.     Microbiology: Recent Results (from the past 240 hour(s))  MRSA PCR Screening     Status: None   Collection Time: 02/16/16 10:12 PM  Result  Value Ref Range Status   MRSA by PCR NEGATIVE NEGATIVE Final    Comment:        The GeneXpert MRSA Assay (FDA approved for NASAL specimens only), is one component of a comprehensive MRSA colonization surveillance program. It is not intended to diagnose MRSA infection nor to guide or monitor treatment for MRSA infections.      Labs: BNP (last 3 results)  Recent Labs  10/15/15 1247 12/15/15 1608 02/16/16 1627  BNP 59.0 56.0 621.3*   Basic Metabolic Panel:  Recent Labs Lab 02/16/16 1627  02/17/16 0158 02/18/16 0526 02/19/16 0653 02/20/16 0649  NA 136 136 135 133* 131*  K 4.1 3.5 4.3 4.6 4.7  CL 103 102 100* 98* 97*  CO2 24 25 25 25 25   GLUCOSE 133* 127* 161* 162* 152*  BUN 16 15 19  33* 45*  CREATININE 1.28* 1.26* 1.38* 1.42* 1.43*  CALCIUM 8.7* 8.6* 8.8* 9.0 8.9  MG 1.8  --   --   --  2.3   Liver Function Tests:  Recent Labs Lab 02/16/16 1627  AST 23  ALT 13*  ALKPHOS 66  BILITOT 0.5  PROT 7.5  ALBUMIN 3.8   No results for input(s): LIPASE, AMYLASE in the last 168 hours. No results for input(s): AMMONIA in the last 168 hours. CBC:  Recent Labs Lab 02/16/16 1627 02/17/16 0158 02/18/16 0526 02/19/16 0653  WBC 7.5 8.0 7.1 9.1  NEUTROABS 4.6 4.3  --   --   HGB 9.6* 9.5* 9.4* 9.4*  HCT 30.5* 30.0* 29.1* 29.6*  MCV 81.8 81.5 81.3 81.8  PLT 430* 394 405* 422*   Cardiac Enzymes:  Recent Labs Lab 02/16/16 1627 02/16/16 1957 02/17/16 0158 02/17/16 0755  TROPONINI <0.03 <0.03 <0.03 <0.03   BNP: Invalid input(s): POCBNP CBG: No results for input(s): GLUCAP in the last 168 hours. D-Dimer No results for input(s): DDIMER in the last 72 hours. Hgb A1c No results for input(s): HGBA1C in the last 72 hours. Lipid Profile No results for input(s): CHOL, HDL, LDLCALC, TRIG, CHOLHDL, LDLDIRECT in the last 72 hours. Thyroid function studies No results for input(s): TSH, T4TOTAL, T3FREE, THYROIDAB in the last 72 hours.  Invalid input(s):  FREET3 Anemia work up No results for input(s): VITAMINB12, FOLATE, FERRITIN, TIBC, IRON, RETICCTPCT in the last 72 hours. Urinalysis    Component Value Date/Time   COLORURINE YELLOW 10/27/2015 1345   APPEARANCEUR HAZY (A) 10/27/2015 1345   LABSPEC 1.010 10/27/2015 1345   PHURINE 5.5 10/27/2015 1345   GLUCOSEU NEGATIVE 10/27/2015 1345   HGBUR TRACE (A) 10/27/2015 1345   BILIRUBINUR NEGATIVE 10/27/2015 1345   KETONESUR NEGATIVE 10/27/2015 1345   PROTEINUR NEGATIVE 10/27/2015 1345   UROBILINOGEN 0.2 02/21/2015 2028   NITRITE NEGATIVE 10/27/2015 1345   LEUKOCYTESUR MODERATE (A) 10/27/2015 1345   Sepsis Labs Invalid input(s): PROCALCITONIN,  WBC,  LACTICIDVEN Microbiology Recent Results (from the past 240 hour(s))  MRSA PCR Screening     Status: None   Collection Time: 02/16/16 10:12 PM  Result Value Ref Range Status   MRSA by PCR NEGATIVE NEGATIVE Final    Comment:        The GeneXpert MRSA Assay (FDA approved for NASAL specimens only), is one component of a comprehensive MRSA colonization surveillance program. It is not intended to diagnose MRSA infection nor to guide or monitor treatment for MRSA infections.      Time coordinating discharge: Over 30 minutes  SIGNED:   Kathie Dike, MD  [facility] 02/20/2016, 10:09 AM Pager   If 7PM-7AM, please contact night-coverage [email or web address] Password [email or web address]

## 2016-02-20 NOTE — Progress Notes (Signed)
CM submitted information to [facility] for review for services.

## 2016-02-21 ENCOUNTER — Encounter: Payer: Self-pay | Admitting: *Deleted

## 2016-02-21 ENCOUNTER — Ambulatory Visit: Payer: Medicare Other | Admitting: Gastroenterology

## 2016-02-21 ENCOUNTER — Other Ambulatory Visit: Payer: Self-pay | Admitting: *Deleted

## 2016-02-21 NOTE — Patient Outreach (Addendum)
[facility]) Care Management  02/21/2016  INGRID SHIFRIN May 06, 1932 [id number]   [name] is an 82 year old female with a hx of HTN, HLD, PAF, diastolic CHF, angina, and CAD s/p stent, who recently presented to the ED with complaints of shortness of breath and chest pain. Mrs. Ivens was found to have A-fib with RVR and acute diastolic CHF.   Mrs. Haaland was discharged to home with her son and daughter late yesterday. I spoke with her today for transition of care program assessments and ongoing follow up for disease management and care coordination.  Patient was recently discharged from hospital and all medications have been reviewed. She has all her prescribed medications on hand except her Eliquis which her daughter is on the home with from the drug store now. Mrs. Hollick confirmed that she received the 30 day voucher for Eliquis. Mrs. Halley told me she usually only has a low copay for her medications ($10 or less) and has been able to meet her copays. I have notified our pharmacy team of need for outreach/follow up on Eliquis assistance.   Mrs. Bircher checked her bp, pulse, O2 saturation, and weight upon rising this morning. She could verbalize to me that she is to check her vital signs each morning and she should call her nurse or doctor if she gains 3 pounds overnight or 5 pounds in a week or if she has swelling, chest pain, shortness of breath, or other new or worsened symptom.   BP 140/79 Comment: patient reported  Pulse 64 Comment: patient reported  Wt 168 lb 6 oz (76.4 kg) Comment: patient reported; home measurement  SpO2 95% Comment: patient reported; home measurement  BMI 31.81 kg/m   Mrs. Torelli lives at home with her daughter and son and says she is never left alone and feels safe and well taken care of.   Mrs. Hackleman did say that she had C Diff a few weeks ago and suffered with diarrhea x 3 weeks. She has not had diarrhea x 2 weeks and is now complaining  of constipation.   Mrs. Stoffers has not yet made an appointment for hospital follow up with her primary care provider. I have reached out to the PCP office to request that they call Mrs. Wynetta Emery for an appointment.   Plan: I will refer Mrs. Puccini to the primary community case Freight forwarder for [facility] in [place] (I was covering when referral was made).   [facility] CM Care Plan Problem One   Flowsheet Row Most Recent Value  Care Plan Problem One  Knowledge Deficits related to self health management of CHF  Role Documenting the Problem One  Care Management [place] for Problem One  Active  [facility] Long Term Goal (31-90 days)  Over the next 60 days, patient will verbalize understanding of plan of care for self health management of CHF  [facility] Long Term Goal Start Date  02/21/16  Interventions for Problem One Long Term Goal  Reviewed with patient, discharge plan/AVS, medications, provider appointment planning, signs and symptoms of CHF  [facility] CM Short Term Goal #1 (0-30 days)  Over the next 14 days, patient will see primary care provider in the office for hospital follow up appointment  [facility] CM Short Term Goal #1 Start Date  02/21/16  Interventions for Short Term Goal #1  reached out to PCP office to request assistance with appointment scheduling  [facility] CM Short Term Goal #2 (0-30 days)  Over the  next 30 days, patient will weigh daily, record, and call provider for weight gain of 3# overnight or 5# in a week  [facility] CM Short Term Goal #2 Start Date  02/21/16  Interventions for Short Term Goal #2  Utilizing teachback method, reviewed with patient necessity of and rationale for daily weights and calling provider for findings outside established parameters  [facility] CM Short Term Goal #3 (0-30 days)  Over the next 30 days, patient will take all medications as prescribed as evidenced by patient/family report of same  [facility] CM Short Term Goal #3 Start Date  02/21/16  Interventions for Short Tern Goal #3  Utilizing teachback  method, reviewed medications with patient and discussed importance of adherence to prescribed regimen       [facility]  [phone]

## 2016-02-22 DIAGNOSIS — I5031 Acute diastolic (congestive) heart failure: Secondary | ICD-10-CM | POA: Diagnosis not present

## 2016-02-22 DIAGNOSIS — K219 Gastro-esophageal reflux disease without esophagitis: Secondary | ICD-10-CM | POA: Diagnosis not present

## 2016-02-22 DIAGNOSIS — F419 Anxiety disorder, unspecified: Secondary | ICD-10-CM | POA: Diagnosis not present

## 2016-02-22 DIAGNOSIS — N183 Chronic kidney disease, stage 3 (moderate): Secondary | ICD-10-CM | POA: Diagnosis not present

## 2016-02-22 DIAGNOSIS — I739 Peripheral vascular disease, unspecified: Secondary | ICD-10-CM | POA: Diagnosis not present

## 2016-02-22 DIAGNOSIS — G8929 Other chronic pain: Secondary | ICD-10-CM | POA: Diagnosis not present

## 2016-02-22 DIAGNOSIS — J441 Chronic obstructive pulmonary disease with (acute) exacerbation: Secondary | ICD-10-CM | POA: Diagnosis not present

## 2016-02-22 DIAGNOSIS — Z7982 Long term (current) use of aspirin: Secondary | ICD-10-CM | POA: Diagnosis not present

## 2016-02-22 DIAGNOSIS — I130 Hypertensive heart and chronic kidney disease with heart failure and stage 1 through stage 4 chronic kidney disease, or unspecified chronic kidney disease: Secondary | ICD-10-CM | POA: Diagnosis not present

## 2016-02-22 DIAGNOSIS — I480 Paroxysmal atrial fibrillation: Secondary | ICD-10-CM | POA: Diagnosis not present

## 2016-02-22 DIAGNOSIS — I25119 Atherosclerotic heart disease of native coronary artery with unspecified angina pectoris: Secondary | ICD-10-CM | POA: Diagnosis not present

## 2016-02-22 DIAGNOSIS — F329 Major depressive disorder, single episode, unspecified: Secondary | ICD-10-CM | POA: Diagnosis not present

## 2016-02-24 DIAGNOSIS — I25119 Atherosclerotic heart disease of native coronary artery with unspecified angina pectoris: Secondary | ICD-10-CM | POA: Diagnosis not present

## 2016-02-24 DIAGNOSIS — I130 Hypertensive heart and chronic kidney disease with heart failure and stage 1 through stage 4 chronic kidney disease, or unspecified chronic kidney disease: Secondary | ICD-10-CM | POA: Diagnosis not present

## 2016-02-24 DIAGNOSIS — J441 Chronic obstructive pulmonary disease with (acute) exacerbation: Secondary | ICD-10-CM | POA: Diagnosis not present

## 2016-02-24 DIAGNOSIS — I5031 Acute diastolic (congestive) heart failure: Secondary | ICD-10-CM | POA: Diagnosis not present

## 2016-02-24 DIAGNOSIS — I480 Paroxysmal atrial fibrillation: Secondary | ICD-10-CM | POA: Diagnosis not present

## 2016-02-24 DIAGNOSIS — N183 Chronic kidney disease, stage 3 (moderate): Secondary | ICD-10-CM | POA: Diagnosis not present

## 2016-02-28 ENCOUNTER — Other Ambulatory Visit: Payer: Self-pay | Admitting: *Deleted

## 2016-02-28 DIAGNOSIS — I25119 Atherosclerotic heart disease of native coronary artery with unspecified angina pectoris: Secondary | ICD-10-CM | POA: Diagnosis not present

## 2016-02-28 DIAGNOSIS — I130 Hypertensive heart and chronic kidney disease with heart failure and stage 1 through stage 4 chronic kidney disease, or unspecified chronic kidney disease: Secondary | ICD-10-CM | POA: Diagnosis not present

## 2016-02-28 DIAGNOSIS — I480 Paroxysmal atrial fibrillation: Secondary | ICD-10-CM | POA: Diagnosis not present

## 2016-02-28 DIAGNOSIS — N183 Chronic kidney disease, stage 3 (moderate): Secondary | ICD-10-CM | POA: Diagnosis not present

## 2016-02-28 DIAGNOSIS — I5031 Acute diastolic (congestive) heart failure: Secondary | ICD-10-CM | POA: Diagnosis not present

## 2016-02-28 DIAGNOSIS — J441 Chronic obstructive pulmonary disease with (acute) exacerbation: Secondary | ICD-10-CM | POA: Diagnosis not present

## 2016-02-28 NOTE — Patient Outreach (Signed)
Telephone call to pt for transition of care week 2, patient's daughter answered the telephone and states her mother is asleep right now, took RN CM contact phone number and states she will have her mother call back.  Jacqlyn Larsen [facility], [place] Coordinator [phone]

## 2016-03-01 ENCOUNTER — Ambulatory Visit (INDEPENDENT_AMBULATORY_CARE_PROVIDER_SITE_OTHER): Payer: Medicare Other | Admitting: Gastroenterology

## 2016-03-01 ENCOUNTER — Encounter: Payer: Self-pay | Admitting: Gastroenterology

## 2016-03-01 VITALS — BP 104/57 | HR 86 | Temp 98.4°F | Ht 61.0 in | Wt 166.6 lb

## 2016-03-01 DIAGNOSIS — K5900 Constipation, unspecified: Secondary | ICD-10-CM | POA: Diagnosis not present

## 2016-03-01 DIAGNOSIS — I2510 Atherosclerotic heart disease of native coronary artery without angina pectoris: Secondary | ICD-10-CM

## 2016-03-01 MED ORDER — HYDROCORTISONE 2.5 % RE CREA: 1.0000 "application " | TOPICAL_CREAM | Freq: Two times a day (BID) | RECTAL | 1 refills | Status: DC

## 2016-03-01 NOTE — Progress Notes (Signed)
Referring Provider: Tawni Carnes, PA-C Primary Care Physician:  [name]  Primary GI: Dr. Gala Romney   Chief Complaint  Patient presents with  . Follow-up    HPI:   [name] is a 82 y.o. female presenting today with a history of dysphagia, chronic nausea, diarrhea. Recent EGD/TCS on file earlier this year. She had called our office with diarrhea, and a stool specimen for CDI was completed via Cdiff PCR. This was negative. She was offered an office visit here.   Returns today stating that [facility] completed a Cdiff test, and she was told it was positive. Prescribed Flagyl for 14 days. Diarrhea cleared up now. Now with lower abdominal discomfort and feels like she is swelling from hemorrhoids. States her lower abdomen feels "icky" and feels sick on her stomach. Denies constipation but just feels like it is hard for her bowels to move due to "swelling" of rectum. Using preparation H but didn't help. States stool is really dark brown. Appetite is not that great. Feels nauseated after eating for about 2 weeks.  Protonix once each morning. Feels like food may not be laying right on her stomach. Lower abdominal discomfort for last 2 weeks. Improved after BM. Feels like not having productive BMs. States she quit taking a probiotic because it made her stomach upset. Sitting on the toilet for at least 15 minutes. Doesn't want to take a laxative.   Past Medical History:  Diagnosis Date  . Anxiety   . Arthritis   . Bursitis    left shoulder  . Cataract   . CHF (congestive heart failure) (HCC)    EF 55-60%  . Complication of anesthesia   . COPD (chronic obstructive pulmonary disease) ([place])   . Coronary atherosclerosis of native coronary artery    a. DES to CX, moderately severe stenosis RCA, mild stenosis LAD 04/2013  . Depression   . Dysphagia, unspecified(787.20)   . GERD (gastroesophageal reflux disease)    Hx Schatzki's ring, multiple EGD/ED last 01/06/2004  .  Headache   . Heart disease   . Heart murmur    'a littel'  . HTN (hypertension)    Hx of it  . Hyperlipemia   . Hyperlipidemia   . Internal hemorrhoids without mention of complication   . MI (myocardial infarction) 2006  . Microscopic colitis 2003  . Other and unspecified hyperlipidemia   . Panic disorder without agoraphobia   . Paresthesia    hands, feet  . Pneumonia 12/2011  . PONV (postoperative nausea and vomiting)    'a little nausea"  . PVD (peripheral vascular disease) ([place])   . S/P colonoscopy 09/27/2001   internal hemorrhoids, desc colon inflam polyp, SB BX-chronic duodenitis, colitis  . Shortness of breath   . Sleep disorder    obstructive  . Thyroid disease    recent abnl TSH per pt    Past Surgical History:  Procedure Laterality Date  . ABDOMINAL HYSTERECTOMY    . ANTERIOR AND POSTERIOR REPAIR     with resection of vagina  . APPENDECTOMY    . BACK SURGERY    . BIOPSY  07/05/2015   Procedure: BIOPSY;  Surgeon: Daneil Dolin, MD;  [facility];  Service: Endoscopy;;  gastric polyp biopsy, ascending colon biopsy  . BLADDER SUSPENSION  11/09/2011   Procedure: TRANSVAGINAL TAPE (TVT) PROCEDURE;  Surgeon: Marissa Nestle, MD;  Location: [facility] ORS;  Service: Urology;  Laterality: N/A;  . bladder tack  06/2010  . BREAST LUMPECTOMY  1998   left, benign  . CARDIAC CATHETERIZATION    . CARDIAC CATHETERIZATION    . CARDIAC CATHETERIZATION N/A 12/16/2015   Procedure: Left Heart Cath and Coronary Angiography;  Surgeon: Troy Sine, MD;  Location: [facility] LAB;  Service: Cardiovascular;  Laterality: N/A;  . [place]   left  . CHOLECYSTECTOMY  1998  . COLONOSCOPY  03/16/2011   multiple hyperplastic colon polyps, sigmoid diverticulosis, melanosis coli  . COLONOSCOPY WITH PROPOFOL N/A 07/05/2015   RMR:one 5 mm polyp in descending colon  . CORONARY ANGIOPLASTY WITH STENT PLACEMENT    . ESOPHAGEAL DILATION N/A 07/05/2015   Procedure:  ESOPHAGEAL DILATION;  Surgeon: Daneil Dolin, MD;  [facility];  Service: Endoscopy;  Laterality: N/A;  . ESOPHAGOGASTRODUODENOSCOPY (EGD) WITH PROPOFOL N/A 07/05/2015   VPX:[id number]  . JOINT REPLACEMENT Right 2007  . left hand surgery    . LEFT HEART CATHETERIZATION WITH CORONARY ANGIOGRAM N/A 05/14/2013   Procedure: LEFT HEART CATHETERIZATION WITH CORONARY ANGIOGRAM;  Surgeon: Blane Ohara, MD;  Location: [facility] CATH LAB;  Service: Cardiovascular;  Laterality: N/A;  . left rotator cuff surgery    . LUMBAR LAMINECTOMY/DECOMPRESSION MICRODISCECTOMY N/A 10/11/2012   Procedure: LUMBAR LAMINECTOMY/DECOMPRESSION MICRODISCECTOMY 2 LEVELS;  Surgeon: Floyce Stakes, MD;  Location: [place] NEURO ORS;  Service: Neurosurgery;  Laterality: N/A;  L3-4 L4-5 Laminectomy  . LUMBAR WOUND DEBRIDEMENT N/A 09/27/2015   Procedure: Exploration of Lumbar Wound w/ Repair CSF Leak/Lumbar Drain Placement;  Surgeon: Leeroy Cha, MD;  Location: [place] NEURO ORS;  Service: Neurosurgery;  Laterality: N/A;  . MALONEY DILATION  03/16/2011   Gastritis. No H.pylori on bx. 31F maloney dilation with disruption of  occult cevical esophageal web  . NASAL SINUS SURGERY    . right knee replacement  2007  . right leg benign tumor    . SHOULDER SURGERY Left   . TONSILLECTOMY    . unspecified area, hysterectomy  1972   partial    Current Outpatient Prescriptions  Medication Sig Dispense Refill  . acetaminophen (TYLENOL) 500 MG tablet Take 500 mg by mouth daily as needed for headache.     . ALPRAZolam (XANAX) 0.25 MG tablet Take 0.25 mg by mouth 2 (two) times daily as needed for anxiety.     [name] Kitchen apixaban (ELIQUIS) 5 MG TABS tablet Take 1 tablet (5 mg total) by mouth 2 (two) times daily. 60 tablet 0  . aspirin 81 MG EC tablet Take 81 mg by mouth every morning.     . carvedilol (COREG) 25 MG tablet Take 12.5 mg by mouth 2 (two) times daily with a meal.     . diltiazem (CARDIZEM CD) 240 MG 24 hr capsule Take 1 capsule (240 mg  total) by mouth daily. 30 capsule 0  . DULoxetine (CYMBALTA) 60 MG capsule Take 60 mg by mouth at bedtime.     . fluticasone (FLONASE) 50 MCG/ACT nasal spray Place 2 sprays into both nostrils daily.     . furosemide (LASIX) 20 MG tablet Take 1 tablet (20 mg total) by mouth daily. 90 tablet 3  . gabapentin (NEURONTIN) 300 MG capsule Take 1 capsule (300 mg total) by mouth 3 (three) times daily. 90 capsule 2  . HYDROcodone-acetaminophen (NORCO/VICODIN) 5-325 MG tablet Take 1 tablet by mouth every 6 (six) hours as needed for severe pain. 10 tablet 0  . ipratropium-albuterol (DUONEB) 0.5-2.5 (3) MG/3ML SOLN Take 3 mLs by nebulization every  6 (six) hours. 360 mL 0  . loratadine (CLARITIN) 10 MG tablet Take 10 mg by mouth every morning.    . Melatonin 3 MG CAPS Take 1 capsule by mouth at bedtime.    . nitroGLYCERIN (NITROSTAT) 0.4 MG SL tablet Place 0.4 mg under the tongue every 5 (five) minutes as needed for chest pain. Reported on 08/04/2015    . pantoprazole (PROTONIX) 40 MG tablet Take 40 mg by mouth every morning.     . potassium chloride (K-DUR,KLOR-CON) 10 MEQ tablet TAKE TWO (2) TABLETS BY MOUTH DAILY. 60 tablet 6  . hydrocortisone (ANUSOL-HC) 2.5 % rectal cream Place 1 application rectally 2 (two) times daily. 30 g 1   No current facility-administered medications for this visit.     Allergies as of 03/01/2016 - Review Complete 03/01/2016  Allergen Reaction Noted  . Cephalosporins Diarrhea and Nausea Only 08/04/2015  . Levaquin [levofloxacin in d5w] Swelling 07/06/2014  . Macrodantin [nitrofurantoin macrocrystal] Swelling 07/06/2014  . Phenothiazines Anaphylaxis and Hives 08/16/2011  . Polysorbate Anaphylaxis 08/16/2011  . Prednisone Shortness Of Breath   . Buspirone Itching 07/06/2014  . Cardura [doxazosin mesylate] Itching 07/06/2014  . Codeine Itching   . Acyclovir and related  10/07/2015  . Prochlorperazine Other (See Comments) 08/16/2011  . Ranexa [ranolazine]  04/06/2015  .  Atorvastatin Hives 08/16/2011  . Ofloxacin Rash   . Other Itching and Rash 10/10/2012  . Penicillins Other (See Comments) 11/09/2011  . Pimozide Hives and Itching 08/16/2011    Family History  Problem Relation Age of Onset  . Stroke Mother   . Parkinson's disease Father   . Coronary artery disease Other     family Hx-sons  . Cancer Other   . Stroke Other     family Hx  . Hypertension Other     family Hx  . Diabetes Brother   . Heart disease Son     before age 63  . Diabetes Son   . Stroke Daughter 45    Social History   Social History  . Marital status: Divorced    Spouse name: N/A  . Number of children: 5  . Years of education: N/A   Occupational History  . retired    Social History Main Topics  . Smoking status: Former Smoker    Packs/day: 1.00    Years: 64.00    Types: Cigarettes    Start date: 12/24/1947    Quit date: 11/17/2001  . Smokeless tobacco: Never Used     Comment: Quit smoking in 2003  . Alcohol use No  . Drug use: No  . Sexual activity: No   Other Topics Concern  . None   Social History Narrative   Divorced.   Sister had colon perforation & died from complications in [place], [place]    Review of Systems: As mentioned in HPI   Physical Exam: BP (!) 104/57   Pulse 86   Temp 98.4 F (36.9 C) (Oral)   Ht 5\' 1"  (1.549 m)   Wt 166 lb 9.6 oz (75.6 kg)   BMI 31.48 kg/m  General:   Alert and oriented. No distress noted. Pleasant and cooperative.  Head:  Normocephalic and atraumatic. Eyes:  Conjuctiva clear without scleral icterus. Abdomen:  +BS, soft, non-tender and non-distended. No rebound or guarding. No HSM or masses noted. Rectal: small, non-thrombosed hemorrhoid external. Internal exam benign.  Msk:  Symmetrical without gross deformities. Normal posture. Extremities:  Without edema. Neurologic:  Alert and  oriented x4;  grossly normal neurologically. Psych:  Alert and cooperative. Normal mood and affect.

## 2016-03-01 NOTE — Patient Instructions (Addendum)
Start taking a stool softener twice a day, such as colace twice a day.   Start taking a probiotic daily such as Counselling psychologist. I will see if we have samples. Other examples are [employer], [facility] brand, [employer].   I have sent in a cream to take twice a day per rectum for 7 days.   We will see you in 6-8 weeks.

## 2016-03-02 DIAGNOSIS — I25119 Atherosclerotic heart disease of native coronary artery with unspecified angina pectoris: Secondary | ICD-10-CM | POA: Diagnosis not present

## 2016-03-02 DIAGNOSIS — I480 Paroxysmal atrial fibrillation: Secondary | ICD-10-CM | POA: Diagnosis not present

## 2016-03-02 DIAGNOSIS — I5031 Acute diastolic (congestive) heart failure: Secondary | ICD-10-CM | POA: Diagnosis not present

## 2016-03-02 DIAGNOSIS — I130 Hypertensive heart and chronic kidney disease with heart failure and stage 1 through stage 4 chronic kidney disease, or unspecified chronic kidney disease: Secondary | ICD-10-CM | POA: Diagnosis not present

## 2016-03-02 DIAGNOSIS — N183 Chronic kidney disease, stage 3 (moderate): Secondary | ICD-10-CM | POA: Diagnosis not present

## 2016-03-02 DIAGNOSIS — J441 Chronic obstructive pulmonary disease with (acute) exacerbation: Secondary | ICD-10-CM | POA: Diagnosis not present

## 2016-03-03 DIAGNOSIS — I5033 Acute on chronic diastolic (congestive) heart failure: Secondary | ICD-10-CM | POA: Diagnosis not present

## 2016-03-03 DIAGNOSIS — I482 Chronic atrial fibrillation: Secondary | ICD-10-CM | POA: Diagnosis not present

## 2016-03-03 DIAGNOSIS — Z6830 Body mass index (BMI) 30.0-30.9, adult: Secondary | ICD-10-CM | POA: Diagnosis not present

## 2016-03-03 DIAGNOSIS — R32 Unspecified urinary incontinence: Secondary | ICD-10-CM | POA: Diagnosis not present

## 2016-03-03 DIAGNOSIS — F419 Anxiety disorder, unspecified: Secondary | ICD-10-CM | POA: Diagnosis not present

## 2016-03-03 DIAGNOSIS — M545 Low back pain: Secondary | ICD-10-CM | POA: Diagnosis not present

## 2016-03-06 ENCOUNTER — Other Ambulatory Visit: Payer: Self-pay | Admitting: *Deleted

## 2016-03-06 NOTE — Patient Outreach (Signed)
RN CM called pt for transition of care week 3, spoke with pt, HIPAA verified, pt reports she is weighing daily, weight today 167 pounds, has all medications and taking as prescribed, saw primary MD last week and will be following up with cardiologist, home health with [facility] continues to work with pt. Due to Thanksgiving holiday and pt leaving her house "to have sprayed for bugs", RN CM will see pt beginning of December.  [facility] CM Care Plan Problem One   Flowsheet Row Most Recent Value  Care Plan Problem One  Knowledge Deficits related to self health management of CHF  Role Documenting the Problem One  Care Management [place] for Problem One  Active  [facility] Long Term Goal (31-90 days)  Over the next 60 days, patient will verbalize understanding of plan of care for self health management of CHF  [facility] Long Term Goal Start Date  02/21/16  Interventions for Problem One Long Term Goal  RN CM reviewed importance of daily weights and correlation to CHF, reviewed importance of attending all scheduled MD appointments  [facility] CM Short Term Goal #1 (0-30 days)  Over the next 14 days, patient will see primary care provider in the office for hospital follow up appointment  [facility] CM Short Term Goal #1 Start Date  02/21/16  [facility] CM Short Term Goal #1 Met Date  03/06/16 Barrie Folk met]  Interventions for Short Term Goal #1  RN CM verified with pt that pt did see primary - Tawni Carnes last Wednesday  [facility] CM Short Term Goal #2 (0-30 days)  Over the next 30 days, patient will weigh daily, record, and call provider for weight gain of 3# overnight or 5# in a week  [facility] CM Short Term Goal #2 Start Date  02/21/16  Interventions for Short Term Goal #2  Reinforced importance of daily weights, pt is weighing daily, this goal will be ongoing over the next few weeks  [facility] CM Short Term Goal #3 (0-30 days)  Over the next 30 days, patient will take all medications as prescribed as evidenced by patient/family report of  same  [facility] CM Short Term Goal #3 Start Date  02/21/16      PLAN Continue weekly transition of care calls See pt for home visit December 8  Jacqlyn Larsen [facility], [place] Coordinator [phone]

## 2016-03-06 NOTE — Assessment & Plan Note (Signed)
82 year old female recently completing therapy for Cdiff (Flagyl for 14 days), with resolution of diarrhea. Now dealing with more constipation symptoms, sitting on toilet for long periods of time, straining. Declining any prescriptive agents or laxatives. Rectal discomfort reported, with exam noting small non-thrombosed hemorrhoid. Will trial colace BID, add probiotic, anusol BID for 7 days, and return in 6-8 weeks. Vague symptoms of nausea and abdominal discomfort likely stemming from incomplete evacuation, constipation.

## 2016-03-07 ENCOUNTER — Encounter: Payer: Self-pay | Admitting: Cardiovascular Disease

## 2016-03-07 ENCOUNTER — Ambulatory Visit (INDEPENDENT_AMBULATORY_CARE_PROVIDER_SITE_OTHER): Payer: Medicare Other | Admitting: Cardiovascular Disease

## 2016-03-07 VITALS — BP 118/62 | HR 83 | Ht 61.0 in | Wt 168.0 lb

## 2016-03-07 DIAGNOSIS — I10 Essential (primary) hypertension: Secondary | ICD-10-CM | POA: Diagnosis not present

## 2016-03-07 DIAGNOSIS — I5031 Acute diastolic (congestive) heart failure: Secondary | ICD-10-CM | POA: Diagnosis not present

## 2016-03-07 DIAGNOSIS — I351 Nonrheumatic aortic (valve) insufficiency: Secondary | ICD-10-CM

## 2016-03-07 DIAGNOSIS — Z9289 Personal history of other medical treatment: Secondary | ICD-10-CM

## 2016-03-07 DIAGNOSIS — I130 Hypertensive heart and chronic kidney disease with heart failure and stage 1 through stage 4 chronic kidney disease, or unspecified chronic kidney disease: Secondary | ICD-10-CM | POA: Diagnosis not present

## 2016-03-07 DIAGNOSIS — E7800 Pure hypercholesterolemia, unspecified: Secondary | ICD-10-CM | POA: Diagnosis not present

## 2016-03-07 DIAGNOSIS — I25119 Atherosclerotic heart disease of native coronary artery with unspecified angina pectoris: Secondary | ICD-10-CM | POA: Diagnosis not present

## 2016-03-07 DIAGNOSIS — I25118 Atherosclerotic heart disease of native coronary artery with other forms of angina pectoris: Secondary | ICD-10-CM

## 2016-03-07 DIAGNOSIS — I5032 Chronic diastolic (congestive) heart failure: Secondary | ICD-10-CM | POA: Diagnosis not present

## 2016-03-07 DIAGNOSIS — I209 Angina pectoris, unspecified: Secondary | ICD-10-CM

## 2016-03-07 DIAGNOSIS — N183 Chronic kidney disease, stage 3 (moderate): Secondary | ICD-10-CM | POA: Diagnosis not present

## 2016-03-07 DIAGNOSIS — J441 Chronic obstructive pulmonary disease with (acute) exacerbation: Secondary | ICD-10-CM | POA: Diagnosis not present

## 2016-03-07 DIAGNOSIS — I2510 Atherosclerotic heart disease of native coronary artery without angina pectoris: Secondary | ICD-10-CM | POA: Diagnosis not present

## 2016-03-07 DIAGNOSIS — I480 Paroxysmal atrial fibrillation: Secondary | ICD-10-CM | POA: Diagnosis not present

## 2016-03-07 NOTE — Progress Notes (Signed)
SUBJECTIVE: The patient is an 82 year old woman who presents for posthospitalization follow-up for acute on chronic diastolic congestive heart failure, acute COPD exacerbation, chest pain, and rapid atrial fibrillation.  Atrial fibrillation was a new diagnosis for her. Eliquis was initiated.  She underwent percutaneous coronary intervention with drug-eluting stent to the left circumflex coronary artery in January 2015. Coronary angiography 12/16/15 showed 20% stenosis in the proximal LAD and in the inferior branch of the first diagonal with a widely patent previously placed circumflex stent. RCA was small and nondominant with 80% ostial stenosis. Medical therapy was recommended.  Echocardiogram 12/17/15: Normal left ventricular systolic function, normal diastolic function, LVEF 27%, mild aortic regurgitation.  She currently denies chest pain, shortness of breath, palpitations, and leg swelling. She is to undergo a procedure on her back due to sciatic pain. She has questions about holding her blood thinner.   Review of Systems: As per "subjective", otherwise negative.  Allergies  Allergen Reactions  . Cephalosporins Diarrhea and Nausea Only    Lightheaded  . Levaquin [Levofloxacin In D5w] Swelling  . Macrodantin [Nitrofurantoin Macrocrystal] Swelling  . Phenothiazines Anaphylaxis and Hives  . Polysorbate Anaphylaxis  . Prednisone Shortness Of Breath  . Buspirone Itching  . Cardura [Doxazosin Mesylate] Itching  . Codeine Itching  . Acyclovir And Related   . Prochlorperazine Other (See Comments)    unknown  . Ranexa [Ranolazine]     Severe drop in BP  . Atorvastatin Hives    Cramping; tolerates Crestor ok  . Ofloxacin Rash  . Other Itching and Rash    "WOOL"= make skin look like it has been burned  . Penicillins Other (See Comments)    Causes redness all over. Has patient had a PCN reaction causing immediate rash, facial/tongue/throat swelling, SOB or lightheadedness with  hypotension: No Has patient had a PCN reaction causing severe rash involving mucus membranes or skin necrosis: No Has patient had a PCN reaction that required hospitalization No Has patient had a PCN reaction occurring within the last 10 years: No If all of the above answers are "NO", then may proceed with Cephalosporin use.   . Pimozide Hives and Itching    Current Outpatient Prescriptions  Medication Sig Dispense Refill  . acetaminophen (TYLENOL) 500 MG tablet Take 500 mg by mouth daily as needed for headache.     . ALPRAZolam (XANAX) 0.25 MG tablet Take 0.25 mg by mouth 2 (two) times daily as needed for anxiety.     [name] Kitchen apixaban (ELIQUIS) 5 MG TABS tablet Take 1 tablet (5 mg total) by mouth 2 (two) times daily. 60 tablet 0  . aspirin 81 MG EC tablet Take 81 mg by mouth every morning.     . carvedilol (COREG) 25 MG tablet Take 12.5 mg by mouth 2 (two) times daily with a meal.     . diltiazem (CARDIZEM CD) 240 MG 24 hr capsule Take 1 capsule (240 mg total) by mouth daily. 30 capsule 0  . DULoxetine (CYMBALTA) 60 MG capsule Take 60 mg by mouth at bedtime.     . fluticasone (FLONASE) 50 MCG/ACT nasal spray Place 2 sprays into both nostrils daily.     . furosemide (LASIX) 20 MG tablet Take 1 tablet (20 mg total) by mouth daily. 90 tablet 3  . gabapentin (NEURONTIN) 300 MG capsule Take 1 capsule (300 mg total) by mouth 3 (three) times daily. 90 capsule 2  . HYDROcodone-acetaminophen (NORCO/VICODIN) 5-325 MG tablet Take 1 tablet by mouth  every 6 (six) hours as needed for severe pain. 10 tablet 0  . hydrocortisone (ANUSOL-HC) 2.5 % rectal cream Place 1 application rectally 2 (two) times daily. 30 g 1  . ipratropium-albuterol (DUONEB) 0.5-2.5 (3) MG/3ML SOLN Take 3 mLs by nebulization every 6 (six) hours. 360 mL 0  . loratadine (CLARITIN) 10 MG tablet Take 10 mg by mouth every morning.    . Melatonin 3 MG CAPS Take 1 capsule by mouth at bedtime.    . nitroGLYCERIN (NITROSTAT) 0.4 MG SL tablet Place  0.4 mg under the tongue every 5 (five) minutes as needed for chest pain. Reported on 08/04/2015    . pantoprazole (PROTONIX) 40 MG tablet Take 40 mg by mouth every morning.     . potassium chloride (K-DUR,KLOR-CON) 10 MEQ tablet TAKE TWO (2) TABLETS BY MOUTH DAILY. 60 tablet 6   No current facility-administered medications for this visit.     Past Medical History:  Diagnosis Date  . Anxiety   . Arthritis   . Bursitis    left shoulder  . Cataract   . CHF (congestive heart failure) (HCC)    EF 55-60%  . Complication of anesthesia   . COPD (chronic obstructive pulmonary disease) ([place])   . Coronary atherosclerosis of native coronary artery    a. DES to CX, moderately severe stenosis RCA, mild stenosis LAD 04/2013  . Depression   . Dysphagia, unspecified(787.20)   . GERD (gastroesophageal reflux disease)    Hx Schatzki's ring, multiple EGD/ED last 01/06/2004  . Headache   . Heart disease   . Heart murmur    'a littel'  . HTN (hypertension)    Hx of it  . Hyperlipemia   . Hyperlipidemia   . Internal hemorrhoids without mention of complication   . MI (myocardial infarction) 2006  . Microscopic colitis 2003  . Other and unspecified hyperlipidemia   . Panic disorder without agoraphobia   . Paresthesia    hands, feet  . Pneumonia 12/2011  . PONV (postoperative nausea and vomiting)    'a little nausea"  . PVD (peripheral vascular disease) ([place])   . S/P colonoscopy 09/27/2001   internal hemorrhoids, desc colon inflam polyp, SB BX-chronic duodenitis, colitis  . Shortness of breath   . Sleep disorder    obstructive  . Thyroid disease    recent abnl TSH per pt    Past Surgical History:  Procedure Laterality Date  . ABDOMINAL HYSTERECTOMY    . ANTERIOR AND POSTERIOR REPAIR     with resection of vagina  . APPENDECTOMY    . BACK SURGERY    . BIOPSY  07/05/2015   Procedure: BIOPSY;  Surgeon: Daneil Dolin, MD;  [facility];  Service: Endoscopy;;  gastric polyp biopsy,  ascending colon biopsy  . BLADDER SUSPENSION  11/09/2011   Procedure: TRANSVAGINAL TAPE (TVT) PROCEDURE;  Surgeon: Marissa Nestle, MD;  Location: [facility] ORS;  Service: Urology;  Laterality: N/A;  . bladder tack  06/2010  . BREAST LUMPECTOMY  1998   left, benign  . CARDIAC CATHETERIZATION    . CARDIAC CATHETERIZATION    . CARDIAC CATHETERIZATION N/A 12/16/2015   Procedure: Left Heart Cath and Coronary Angiography;  Surgeon: Troy Sine, MD;  Location: [place] CV LAB;  Service: Cardiovascular;  Laterality: N/A;  . [place]   left  . CHOLECYSTECTOMY  1998  . COLONOSCOPY  03/16/2011   multiple hyperplastic colon polyps, sigmoid diverticulosis, melanosis coli  .  COLONOSCOPY WITH PROPOFOL N/A 07/05/2015   RMR:one 5 mm polyp in descending colon  . CORONARY ANGIOPLASTY WITH STENT PLACEMENT    . ESOPHAGEAL DILATION N/A 07/05/2015   Procedure: ESOPHAGEAL DILATION;  Surgeon: Daneil Dolin, MD;  [facility];  Service: Endoscopy;  Laterality: N/A;  . ESOPHAGOGASTRODUODENOSCOPY (EGD) WITH PROPOFOL N/A 07/05/2015   [id number]  . JOINT REPLACEMENT Right 2007  . left hand surgery    . LEFT HEART CATHETERIZATION WITH CORONARY ANGIOGRAM N/A 05/14/2013   Procedure: LEFT HEART CATHETERIZATION WITH CORONARY ANGIOGRAM;  Surgeon: Blane Ohara, MD;  Location: [facility] CATH LAB;  Service: Cardiovascular;  Laterality: N/A;  . left rotator cuff surgery    . LUMBAR LAMINECTOMY/DECOMPRESSION MICRODISCECTOMY N/A 10/11/2012   Procedure: LUMBAR LAMINECTOMY/DECOMPRESSION MICRODISCECTOMY 2 LEVELS;  Surgeon: Floyce Stakes, MD;  Location: [place] NEURO ORS;  Service: Neurosurgery;  Laterality: N/A;  L3-4 L4-5 Laminectomy  . LUMBAR WOUND DEBRIDEMENT N/A 09/27/2015   Procedure: Exploration of Lumbar Wound w/ Repair CSF Leak/Lumbar Drain Placement;  Surgeon: Leeroy Cha, MD;  Location: [place] NEURO ORS;  Service: Neurosurgery;  Laterality: N/A;  . MALONEY DILATION  03/16/2011   Gastritis. No H.pylori  on bx. 38F maloney dilation with disruption of  occult cevical esophageal web  . NASAL SINUS SURGERY    . right knee replacement  2007  . right leg benign tumor    . SHOULDER SURGERY Left   . TONSILLECTOMY    . unspecified area, hysterectomy  1972   partial    Social History   Social History  . Marital status: Divorced    Spouse name: N/A  . Number of children: 5  . Years of education: N/A   Occupational History  . retired    Social History Main Topics  . Smoking status: Former Smoker    Packs/day: 1.00    Years: 64.00    Types: Cigarettes    Start date: 12/24/1947    Quit date: 11/17/2001  . Smokeless tobacco: Never Used     Comment: Quit smoking in 2003  . Alcohol use No  . Drug use: No  . Sexual activity: No   Other Topics Concern  . Not on file   Social History Narrative   Divorced.   Sister had colon perforation & died from complications in [place], [place]     Vitals:   03/07/16 1041  BP: 118/62  Pulse: 83  SpO2: 93%  Weight: 168 lb (76.2 kg)  Height: 5\' 1"  (1.549 m)    PHYSICAL EXAM General: NAD HEENT: Normal. Neck: No JVD, no thyromegaly. Lungs: Clear to auscultation bilaterally with normal respiratory effort. CV: Nondisplaced PMI.  Regular rate and irregular rhythm, normal S1/S2, no S3, no murmur. No pretibial or periankle edema.    Abdomen: Soft, nontender, no distention.  Neurologic: Alert and oriented.  Psych: Normal affect.    ECG: Most recent ECG reviewed.      ASSESSMENT AND PLAN: 1. CAD with DES to LCx: Stable. Cath reviewed above. Continue current therapy with aspirin and Coreg. Statin intolerant.  2. Essential HTN: Controlled. No changes.  3. Chronic diastolic heart failure: Euvolemic. Continue Lasix at present dose.  4. Hyperlipidemia: Statin intolerant.  5. Aortic regurgitation: Will monitor clinically and with surveillance echocardiograms. Mild at present.  6. Atrial fibrillation: Continue diltiazem, Coreg, and Eliquis.  Eliquis can be held 2 days prior to procedure.  Dispo: fu 3 months.   Kate Sable, M.D., F.A.C.C.

## 2016-03-07 NOTE — Progress Notes (Signed)
CC'ED TO PCP 

## 2016-03-07 NOTE — Patient Instructions (Signed)
Your physician recommends that you schedule a follow-up appointment in: 3 months Dr Bronson Ing    Your physician recommends that you continue on your current medications as directed. Please refer to the Current Medication list given to you today..    If you need a refill on your cardiac medications before your next appointment, please call your pharmacy.    Thank you for choosing [facility] !

## 2016-03-13 ENCOUNTER — Other Ambulatory Visit: Payer: Self-pay | Admitting: *Deleted

## 2016-03-13 DIAGNOSIS — I5031 Acute diastolic (congestive) heart failure: Secondary | ICD-10-CM | POA: Diagnosis not present

## 2016-03-13 DIAGNOSIS — N183 Chronic kidney disease, stage 3 (moderate): Secondary | ICD-10-CM | POA: Diagnosis not present

## 2016-03-13 DIAGNOSIS — I25119 Atherosclerotic heart disease of native coronary artery with unspecified angina pectoris: Secondary | ICD-10-CM | POA: Diagnosis not present

## 2016-03-13 DIAGNOSIS — I130 Hypertensive heart and chronic kidney disease with heart failure and stage 1 through stage 4 chronic kidney disease, or unspecified chronic kidney disease: Secondary | ICD-10-CM | POA: Diagnosis not present

## 2016-03-13 DIAGNOSIS — I480 Paroxysmal atrial fibrillation: Secondary | ICD-10-CM | POA: Diagnosis not present

## 2016-03-13 DIAGNOSIS — J441 Chronic obstructive pulmonary disease with (acute) exacerbation: Secondary | ICD-10-CM | POA: Diagnosis not present

## 2016-03-13 NOTE — Patient Outreach (Signed)
03/13/16- Telephone call to patient for transition of care week 4, spoke with pt, HIPAA verified, pt reports she is weighing daily with weight today 169 pounds, has medications and taking as prescribed, daughter is to pickup pain medication at pharmacy, home health RN is to see pt today.  No new concerns verbalized.  [facility] CM Care Plan Problem One   Flowsheet Row Most Recent Value  Care Plan Problem One  Knowledge Deficits related to self health management of CHF  Role Documenting the Problem One  Care Management [place] for Problem One  Active  [facility] Long Term Goal (31-90 days)  Over the next 60 days, patient will verbalize understanding of plan of care for self health management of CHF  [facility] Long Term Goal Start Date  02/21/16  Interventions for Problem One Long Term Goal  RN CM reiterated importance of daily weights and correlation to CHF, reviewed importance of attending all scheduled MD appointments  [facility] CM Short Term Goal #1 (0-30 days)  Over the next 14 days, patient will see primary care provider in the office for hospital follow up appointment  [facility] CM Short Term Goal #1 Start Date  02/21/16  [facility] CM Short Term Goal #1 Met Date  03/06/16 [goal met]  [facility] CM Short Term Goal #2 (0-30 days)  Over the next 30 days, patient will weigh daily, record, and call provider for weight gain of 3# overnight or 5# in a week  [facility] CM Short Term Goal #2 Start Date  02/21/16  Interventions for Short Term Goal #2  Reiterated importance of daily weights, pt is weighing daily, this goal will be ongoing over the next few weeks     PLAN See pt for initial home visit next week  Jacqlyn Larsen [facility], [place] [phone]

## 2016-03-15 ENCOUNTER — Other Ambulatory Visit: Payer: Self-pay

## 2016-03-15 DIAGNOSIS — D519 Vitamin B12 deficiency anemia, unspecified: Secondary | ICD-10-CM | POA: Diagnosis not present

## 2016-03-15 DIAGNOSIS — Z6831 Body mass index (BMI) 31.0-31.9, adult: Secondary | ICD-10-CM | POA: Diagnosis not present

## 2016-03-15 DIAGNOSIS — D649 Anemia, unspecified: Secondary | ICD-10-CM | POA: Diagnosis not present

## 2016-03-15 DIAGNOSIS — I482 Chronic atrial fibrillation: Secondary | ICD-10-CM | POA: Diagnosis not present

## 2016-03-15 NOTE — Patient Outreach (Signed)
I called [name] to discuss her need for assistance with Eliquis.  I verified her PHI during the phone call.  She stated she received a 30 day free trial of Eliquis once she got home from the hospital.  She stated she has Extra [employer] and her medications usually cost between $2 to $4.  She has not had the next prescription for Eliquis filled and it is due to be filled on 03/21/16.  She stated that the bottle states no refills are left.  I asked for her to call her cardiologist to get a new prescription sent in to the pharmacy since the hospitalist is the one who wrote the prescription.  She stated she will call him today.  I asked for her to call me once she gets the prescription filled to let me know if she is able to afford it.  I cannot tell her how much it will cost her.  She stated she will call me once she finds out.  I will follow up with her next week if I do not here back from her.    Deanne Coffer, PharmD, [employer] [phone]

## 2016-03-20 ENCOUNTER — Emergency Department (HOSPITAL_COMMUNITY): Payer: Medicare Other

## 2016-03-20 ENCOUNTER — Observation Stay (HOSPITAL_COMMUNITY)
Admission: EM | Admit: 2016-03-20 | Discharge: 2016-03-22 | Disposition: A | Discharge status: Home / Self Care | Payer: Medicare Other | Attending: Family Medicine | Admitting: Family Medicine

## 2016-03-20 ENCOUNTER — Encounter (HOSPITAL_COMMUNITY): Payer: Self-pay | Admitting: Emergency Medicine

## 2016-03-20 ENCOUNTER — Ambulatory Visit: Payer: Self-pay

## 2016-03-20 DIAGNOSIS — G3184 Mild cognitive impairment, so stated: Secondary | ICD-10-CM | POA: Diagnosis present

## 2016-03-20 DIAGNOSIS — I2510 Atherosclerotic heart disease of native coronary artery without angina pectoris: Secondary | ICD-10-CM | POA: Diagnosis not present

## 2016-03-20 DIAGNOSIS — R0902 Hypoxemia: Secondary | ICD-10-CM | POA: Diagnosis not present

## 2016-03-20 DIAGNOSIS — R0602 Shortness of breath: Secondary | ICD-10-CM | POA: Diagnosis not present

## 2016-03-20 DIAGNOSIS — I130 Hypertensive heart and chronic kidney disease with heart failure and stage 1 through stage 4 chronic kidney disease, or unspecified chronic kidney disease: Secondary | ICD-10-CM | POA: Insufficient documentation

## 2016-03-20 DIAGNOSIS — Z87891 Personal history of nicotine dependence: Secondary | ICD-10-CM | POA: Insufficient documentation

## 2016-03-20 DIAGNOSIS — Z7982 Long term (current) use of aspirin: Secondary | ICD-10-CM | POA: Insufficient documentation

## 2016-03-20 DIAGNOSIS — I5031 Acute diastolic (congestive) heart failure: Secondary | ICD-10-CM | POA: Diagnosis not present

## 2016-03-20 DIAGNOSIS — N183 Chronic kidney disease, stage 3 unspecified: Secondary | ICD-10-CM | POA: Diagnosis present

## 2016-03-20 DIAGNOSIS — R0609 Other forms of dyspnea: Secondary | ICD-10-CM | POA: Diagnosis present

## 2016-03-20 DIAGNOSIS — Z79899 Other long term (current) drug therapy: Secondary | ICD-10-CM | POA: Insufficient documentation

## 2016-03-20 DIAGNOSIS — F419 Anxiety disorder, unspecified: Secondary | ICD-10-CM | POA: Diagnosis present

## 2016-03-20 DIAGNOSIS — I25119 Atherosclerotic heart disease of native coronary artery with unspecified angina pectoris: Secondary | ICD-10-CM | POA: Diagnosis not present

## 2016-03-20 DIAGNOSIS — R05 Cough: Secondary | ICD-10-CM | POA: Diagnosis not present

## 2016-03-20 DIAGNOSIS — I5032 Chronic diastolic (congestive) heart failure: Secondary | ICD-10-CM | POA: Diagnosis present

## 2016-03-20 DIAGNOSIS — I480 Paroxysmal atrial fibrillation: Secondary | ICD-10-CM | POA: Diagnosis not present

## 2016-03-20 DIAGNOSIS — I5033 Acute on chronic diastolic (congestive) heart failure: Secondary | ICD-10-CM | POA: Insufficient documentation

## 2016-03-20 DIAGNOSIS — I10 Essential (primary) hypertension: Secondary | ICD-10-CM | POA: Diagnosis present

## 2016-03-20 DIAGNOSIS — J441 Chronic obstructive pulmonary disease with (acute) exacerbation: Secondary | ICD-10-CM | POA: Diagnosis not present

## 2016-03-20 LAB — CBC WITH DIFFERENTIAL/PLATELET
Basophils Absolute: 0.1 10*3/uL (ref 0.0–0.1)
Basophils Relative: 1 %
EOS PCT: 3 %
Eosinophils Absolute: 0.2 10*3/uL (ref 0.0–0.7)
HCT: 28.4 % — ABNORMAL LOW (ref 36.0–46.0)
Hemoglobin: 9.1 g/dL — ABNORMAL LOW (ref 12.0–15.0)
LYMPHS ABS: 2.2 10*3/uL (ref 0.7–4.0)
LYMPHS PCT: 31 %
MCH: 25.6 pg — AB (ref 26.0–34.0)
MCHC: 32 g/dL (ref 30.0–36.0)
MCV: 80 fL (ref 78.0–100.0)
MONO ABS: 0.6 10*3/uL (ref 0.1–1.0)
MONOS PCT: 8 %
Neutro Abs: 4.1 10*3/uL (ref 1.7–7.7)
Neutrophils Relative %: 57 %
PLATELETS: 364 10*3/uL (ref 150–400)
RBC: 3.55 MIL/uL — AB (ref 3.87–5.11)
RDW: 15.7 % — ABNORMAL HIGH (ref 11.5–15.5)
WBC: 7.1 10*3/uL (ref 4.0–10.5)

## 2016-03-20 LAB — COMPREHENSIVE METABOLIC PANEL
ALT: 14 U/L (ref 14–54)
AST: 17 U/L (ref 15–41)
Albumin: 3.8 g/dL (ref 3.5–5.0)
Alkaline Phosphatase: 94 U/L (ref 38–126)
Anion gap: 8 (ref 5–15)
BUN: 16 mg/dL (ref 6–20)
CALCIUM: 9.1 mg/dL (ref 8.9–10.3)
CHLORIDE: 98 mmol/L — AB (ref 101–111)
CO2: 25 mmol/L (ref 22–32)
CREATININE: 1.03 mg/dL — AB (ref 0.44–1.00)
GFR, EST AFRICAN AMERICAN: 57 mL/min — AB (ref >60)
GFR, EST NON AFRICAN AMERICAN: 50 mL/min — AB (ref >60)
Glucose, Bld: 91 mg/dL (ref 65–99)
Potassium: 3.8 mmol/L (ref 3.5–5.1)
Sodium: 131 mmol/L — ABNORMAL LOW (ref 135–145)
Total Bilirubin: 0.6 mg/dL (ref 0.3–1.2)
Total Protein: 7.3 g/dL (ref 6.5–8.1)

## 2016-03-20 LAB — TROPONIN I

## 2016-03-20 LAB — BRAIN NATRIURETIC PEPTIDE: B NATRIURETIC PEPTIDE 5: 338 pg/mL — AB (ref 0.0–100.0)

## 2016-03-20 MED ORDER — PANTOPRAZOLE SODIUM 40 MG PO TBEC
40.0000 mg | DELAYED_RELEASE_TABLET | Freq: Every morning | ORAL | Status: DC
  Administered 2016-03-21 – 2016-03-22 (×2): 40 mg via ORAL
  Filled 2016-03-20 (×3): qty 1

## 2016-03-20 MED ORDER — SODIUM CHLORIDE 0.9% FLUSH: 3.0000 mL | INTRAVENOUS | Status: DC | PRN

## 2016-03-20 MED ORDER — IPRATROPIUM-ALBUTEROL 0.5-2.5 (3) MG/3ML IN SOLN
3.0000 mL | Freq: Four times a day (QID) | RESPIRATORY_TRACT | Status: DC
  Administered 2016-03-21: 3 mL via RESPIRATORY_TRACT
  Filled 2016-03-20: qty 3

## 2016-03-20 MED ORDER — ASPIRIN EC 81 MG PO TBEC
81.0000 mg | DELAYED_RELEASE_TABLET | Freq: Every morning | ORAL | Status: DC
  Administered 2016-03-21 – 2016-03-22 (×2): 81 mg via ORAL
  Filled 2016-03-20 (×3): qty 1

## 2016-03-20 MED ORDER — SODIUM CHLORIDE 0.9% FLUSH
3.0000 mL | Freq: Two times a day (BID) | INTRAVENOUS | Status: DC
  Administered 2016-03-20 – 2016-03-22 (×4): 3 mL via INTRAVENOUS

## 2016-03-20 MED ORDER — CARVEDILOL 12.5 MG PO TABS
12.5000 mg | ORAL_TABLET | Freq: Two times a day (BID) | ORAL | Status: DC
  Administered 2016-03-21 – 2016-03-22 (×3): 12.5 mg via ORAL
  Filled 2016-03-20 (×4): qty 1

## 2016-03-20 MED ORDER — APIXABAN 5 MG PO TABS
5.0000 mg | ORAL_TABLET | Freq: Two times a day (BID) | ORAL | Status: DC
  Administered 2016-03-20 – 2016-03-22 (×4): 5 mg via ORAL
  Filled 2016-03-20 (×4): qty 1

## 2016-03-20 MED ORDER — FUROSEMIDE 10 MG/ML IJ SOLN
40.0000 mg | Freq: Once | INTRAMUSCULAR | Status: AC
  Administered 2016-03-21: 40 mg via INTRAVENOUS
  Filled 2016-03-20: qty 4

## 2016-03-20 MED ORDER — DILTIAZEM HCL ER COATED BEADS 240 MG PO CP24
240.0000 mg | ORAL_CAPSULE | Freq: Every day | ORAL | Status: DC
  Administered 2016-03-21 – 2016-03-22 (×2): 240 mg via ORAL
  Filled 2016-03-20 (×2): qty 1

## 2016-03-20 MED ORDER — FUROSEMIDE 20 MG PO TABS
20.0000 mg | ORAL_TABLET | Freq: Every day | ORAL | Status: DC
  Administered 2016-03-21 – 2016-03-22 (×2): 20 mg via ORAL
  Filled 2016-03-20 (×2): qty 1

## 2016-03-20 MED ORDER — SODIUM CHLORIDE 0.9 % IV SOLN: 250.0000 mL | INTRAVENOUS | Status: DC | PRN

## 2016-03-20 MED ORDER — GABAPENTIN 300 MG PO CAPS
300.0000 mg | ORAL_CAPSULE | Freq: Three times a day (TID) | ORAL | Status: DC
  Administered 2016-03-20 – 2016-03-22 (×6): 300 mg via ORAL
  Filled 2016-03-20 (×6): qty 1

## 2016-03-20 MED ORDER — LORATADINE 10 MG PO TABS
10.0000 mg | ORAL_TABLET | Freq: Every morning | ORAL | Status: DC
  Administered 2016-03-21 – 2016-03-22 (×2): 10 mg via ORAL
  Filled 2016-03-20 (×3): qty 1

## 2016-03-20 MED ORDER — DULOXETINE HCL 60 MG PO CPEP
60.0000 mg | ORAL_CAPSULE | Freq: Every day | ORAL | Status: DC
  Administered 2016-03-20 – 2016-03-21 (×2): 60 mg via ORAL
  Filled 2016-03-20 (×2): qty 1

## 2016-03-20 MED ORDER — ALPRAZOLAM 0.25 MG PO TABS
0.2500 mg | ORAL_TABLET | Freq: Two times a day (BID) | ORAL | Status: DC | PRN
  Administered 2016-03-21 (×2): 0.25 mg via ORAL
  Filled 2016-03-20 (×2): qty 1

## 2016-03-20 NOTE — ED Provider Notes (Signed)
[facility] DEPT Provider Note   CSN: [id number] Arrival date & time: 03/20/16  1349     History   Chief Complaint Chief Complaint  Patient presents with  . Shortness of Breath    HPI [name] is a 82 y.o. female.  : Patient with a one-week history of exertional shortness of breath. Patient does not have home oxygen. Does have a bili checked oxygen saturations at home. Patient has a history of congestive heart failure. However patient also does have the diagnosis of COPD. Patient states she was unaware she had a problem there. Patient denies any chest pain fevers cough congestion. Patient denies any abdominal pain. Home nurse got an oxygen level of 89% this morning. Patient was referred in. Patient arrived here with a room air oxygen saturation 97%.      Past Medical History:  Diagnosis Date  . Anxiety   . Arthritis   . Bursitis    left shoulder  . Cataract   . CHF (congestive heart failure) (HCC)    EF 55-60%  . Complication of anesthesia   . COPD (chronic obstructive pulmonary disease) ([place])   . Coronary atherosclerosis of native coronary artery    a. DES to CX, moderately severe stenosis RCA, mild stenosis LAD 04/2013  . Depression   . Dysphagia, unspecified(787.20)   . GERD (gastroesophageal reflux disease)    Hx Schatzki's ring, multiple EGD/ED last 01/06/2004  . Headache   . Heart disease   . Heart murmur    'a littel'  . HTN (hypertension)    Hx of it  . Hyperlipemia   . Hyperlipidemia   . Internal hemorrhoids without mention of complication   . MI (myocardial infarction) 2006  . Microscopic colitis 2003  . Other and unspecified hyperlipidemia   . Panic disorder without agoraphobia   . Paresthesia    hands, feet  . Pneumonia 12/2011  . PONV (postoperative nausea and vomiting)    'a little nausea"  . PVD (peripheral vascular disease) ([place])   . S/P colonoscopy 09/27/2001   internal hemorrhoids, desc colon inflam polyp, SB BX-chronic duodenitis,  colitis  . Shortness of breath   . Sleep disorder    obstructive  . Thyroid disease    recent abnl TSH per pt    Patient Active Problem List   Diagnosis Date Noted  . Acute on chronic diastolic congestive heart failure ([place])   . Atrial fibrillation ([place])   . Chronic kidney disease, stage III (moderate) 02/17/2016  . Essential hypertension 02/17/2016  . COPD exacerbation ([place]) 02/17/2016  . Acute bronchitis 12/18/2015  . CAD in native artery   . Chest pain at rest 10/15/2015  . Chronic low back pain 10/15/2015  . Hyponatremia 10/15/2015  . Hyperglycemia 10/15/2015  . Thrombocytosis ([place]) 10/15/2015  . Atypical chest pain 10/15/2015  . Depression   . Anxiety   . Gastroesophageal reflux disease without esophagitis   . Mild cognitive impairment 10/14/2015  . Iron deficiency anemia due to chronic blood loss   . Meningitis 10/01/2015  . Coronary artery disease due to lipid rich plaque   . NSVT (nonsustained ventricular tachycardia) ([place])   . PAF (paroxysmal atrial fibrillation) ([place])   . CSF leak 09/27/2015  . Spondylolisthesis of lumbar region 09/24/2015  . Gastric polyp   . History of colonic polyps   . Chronic diarrhea   . Diarrhea 04/01/2015  . Esophageal dysphagia 04/01/2015  . PAOD (peripheral arterial occlusive disease) ([place]) 03/05/2015  . Pain in  the chest   . Hyperlipidemia   . Weight gain 08/12/2014  . Normocytic anemia 08/07/2014  . Hemorrhoids 08/06/2014  . Diastolic CHF, acute on chronic (HCC) 07/30/2014  . CHF (congestive heart failure) ([place]) 07/29/2014  . Rectal bleeding 07/06/2014  . Constipation 07/06/2014  . Lower extremity edema 11/24/2013  . Acute kidney failure ([place]) 11/24/2013  . Hypokalemia 11/24/2013  . Other and unspecified angina pectoris 05/14/2013  . Hematochezia 02/13/2011  . Abdominal pain 02/13/2011  . Major depression ([place]) 09/28/2010  . FATIGUE 04/13/2009  . Chronic diastolic heart failure ([place]) 11/30/2008  . DYSPNEA 11/30/2008  .  HYPERLIPIDEMIA-MIXED 11/27/2008  . CAD, NATIVE VESSEL - PCI + DES to left circumflex 05/14/13 11/27/2008  . Peripheral vascular disease ([place]) 11/27/2008  . PANIC ATTACK 02/28/2008  . MI 02/28/2008  . Internal hemorrhoids 02/28/2008  . GASTROESOPHAGEAL REFLUX DISEASE, CHRONIC 02/28/2008  . COLITIS 02/28/2008  . Dysphagia 02/28/2008    Past Surgical History:  Procedure Laterality Date  . ABDOMINAL HYSTERECTOMY    . ANTERIOR AND POSTERIOR REPAIR     with resection of vagina  . APPENDECTOMY    . BACK SURGERY    . BIOPSY  07/05/2015   Procedure: BIOPSY;  Surgeon: Daneil Dolin, MD;  [facility] ENDO [facility];  Service: Endoscopy;;  gastric polyp biopsy, ascending colon biopsy  . BLADDER SUSPENSION  11/09/2011   Procedure: TRANSVAGINAL TAPE (TVT) PROCEDURE;  Surgeon: Marissa Nestle, MD;  Location: [facility] ORS;  Service: Urology;  Laterality: N/A;  . bladder tack  06/2010  . BREAST LUMPECTOMY  1998   left, benign  . CARDIAC CATHETERIZATION    . CARDIAC CATHETERIZATION    . CARDIAC CATHETERIZATION N/A 12/16/2015   Procedure: Left Heart Cath and Coronary Angiography;  Surgeon: Troy Sine, MD;  Location: [facility] CV LAB;  Service: Cardiovascular;  Laterality: N/A;  . [place]   left  . CHOLECYSTECTOMY  1998  . COLONOSCOPY  03/16/2011   multiple hyperplastic colon polyps, sigmoid diverticulosis, melanosis coli  . COLONOSCOPY WITH PROPOFOL N/A 07/05/2015   RMR:one 5 mm polyp in descending colon  . CORONARY ANGIOPLASTY WITH STENT PLACEMENT    . ESOPHAGEAL DILATION N/A 07/05/2015   Procedure: ESOPHAGEAL DILATION;  Surgeon: Daneil Dolin, MD;  [facility];  Service: Endoscopy;  Laterality: N/A;  . ESOPHAGOGASTRODUODENOSCOPY (EGD) WITH PROPOFOL N/A 07/05/2015   [id number]  . JOINT REPLACEMENT Right 2007  . left hand surgery    . LEFT HEART CATHETERIZATION WITH CORONARY ANGIOGRAM N/A 05/14/2013   Procedure: LEFT HEART CATHETERIZATION WITH CORONARY ANGIOGRAM;   Surgeon: Blane Ohara, MD;  Location: [facility] CATH LAB;  Service: Cardiovascular;  Laterality: N/A;  . left rotator cuff surgery    . LUMBAR LAMINECTOMY/DECOMPRESSION MICRODISCECTOMY N/A 10/11/2012   Procedure: LUMBAR LAMINECTOMY/DECOMPRESSION MICRODISCECTOMY 2 LEVELS;  Surgeon: Floyce Stakes, MD;  Location: [facility] NEURO ORS;  Service: Neurosurgery;  Laterality: N/A;  L3-4 L4-5 Laminectomy  . LUMBAR WOUND DEBRIDEMENT N/A 09/27/2015   Procedure: Exploration of Lumbar Wound w/ Repair CSF Leak/Lumbar Drain Placement;  Surgeon: Leeroy Cha, MD;  Location: [facility] NEURO ORS;  Service: Neurosurgery;  Laterality: N/A;  . MALONEY DILATION  03/16/2011   Gastritis. No H.pylori on bx. 29F maloney dilation with disruption of  occult cevical esophageal web  . NASAL SINUS SURGERY    . right knee replacement  2007  . right leg benign tumor    . SHOULDER SURGERY Left   . TONSILLECTOMY    .  unspecified area, hysterectomy  1972   partial    OB History    Gravida Para Term Preterm AB Living   8 6     2 5    SAB TAB Ectopic Multiple Live Births                   Home Medications    Prior to Admission medications   Medication Sig Start Date End Date Taking? Authorizing Provider  acetaminophen (TYLENOL) 500 MG tablet Take 500 mg by mouth daily as needed for headache.    Yes Historical Provider, MD  ALPRAZolam (XANAX) 0.25 MG tablet Take 0.25 mg by mouth 2 (two) times daily as needed for anxiety.    Yes Historical Provider, MD  apixaban (ELIQUIS) 5 MG TABS tablet Take 1 tablet (5 mg total) by mouth 2 (two) times daily. 02/20/16  Yes Kathie Dike, MD  aspirin 81 MG EC tablet Take 81 mg by mouth every morning.    Yes Historical Provider, MD  carvedilol (COREG) 25 MG tablet Take 12.5 mg by mouth 2 (two) times daily with a meal.    Yes Historical Provider, MD  diltiazem (CARDIZEM CD) 240 MG 24 hr capsule Take 1 capsule (240 mg total) by mouth daily. 02/21/16  Yes Kathie Dike, MD  DULoxetine (CYMBALTA) 60 MG  capsule Take 60 mg by mouth at bedtime.    Yes Historical Provider, MD  fluticasone (FLONASE) 50 MCG/ACT nasal spray Place 2 sprays into both nostrils daily.    Yes Historical Provider, MD  furosemide (LASIX) 20 MG tablet Take 1 tablet (20 mg total) by mouth daily. 05/25/15  Yes Lendon Colonel, NP  gabapentin (NEURONTIN) 300 MG capsule Take 1 capsule (300 mg total) by mouth 3 (three) times daily. 10/08/15  Yes Kevan Ny Ditty, MD  hydrocortisone (ANUSOL-HC) 2.5 % rectal cream Place 1 application rectally 2 (two) times daily. 03/01/16  Yes Annitta Needs, NP  ipratropium-albuterol (DUONEB) 0.5-2.5 (3) MG/3ML SOLN Take 3 mLs by nebulization every 6 (six) hours. 12/18/15  Yes Belkys A Regalado, MD  loratadine (CLARITIN) 10 MG tablet Take 10 mg by mouth every morning.   Yes Historical Provider, MD  Melatonin 3 MG CAPS Take 1 capsule by mouth at bedtime.   Yes Historical Provider, MD  nitroGLYCERIN (NITROSTAT) 0.4 MG SL tablet Place 0.4 mg under the tongue every 5 (five) minutes as needed for chest pain. Reported on 08/04/2015   Yes Historical Provider, MD  Oxycodone HCl 10 MG TABS Take 5-10 mg by mouth every 4 (four) hours as needed. For pain 03/13/16  Yes Historical Provider, MD  pantoprazole (PROTONIX) 40 MG tablet Take 40 mg by mouth every morning.    Yes Historical Provider, MD  potassium chloride (K-DUR,KLOR-CON) 10 MEQ tablet TAKE TWO (2) TABLETS BY MOUTH DAILY. 04/30/15  Yes Herminio Commons, MD  HYDROcodone-acetaminophen (NORCO/VICODIN) 5-325 MG tablet Take 1 tablet by mouth every 6 (six) hours as needed for severe pain. Patient not taking: Reported on 03/20/2016 02/20/16   Kathie Dike, MD    Family History Family History  Problem Relation Age of Onset  . Stroke Mother   . Parkinson's disease Father   . Coronary artery disease Other     family Hx-sons  . Cancer Other   . Stroke Other     family Hx  . Hypertension Other     family Hx  . Diabetes Brother   . Heart disease Son      before age 48  .  Diabetes Son   . Stroke Daughter 16    Social History Social History  Substance Use Topics  . Smoking status: Former Smoker    Packs/day: 1.00    Years: 64.00    Types: Cigarettes    Start date: 12/24/1947    Quit date: 11/17/2001  . Smokeless tobacco: Never Used     Comment: Quit smoking in 2003  . Alcohol use No     Allergies   Cephalosporins; Levaquin [levofloxacin in d5w]; Macrodantin [nitrofurantoin macrocrystal]; Phenothiazines; Polysorbate; Prednisone; Buspirone; Cardura [doxazosin mesylate]; Codeine; Acyclovir and related; Prochlorperazine; Ranexa [ranolazine]; Atorvastatin; Ofloxacin; Other; Penicillins; and Pimozide   Review of Systems Review of Systems  Constitutional: Negative for fever.  HENT: Negative for congestion.   Eyes: Negative for redness.  Respiratory: Positive for shortness of breath.   Cardiovascular: Negative for chest pain.  Gastrointestinal: Negative for abdominal pain.  Genitourinary: Negative for dysuria.  Musculoskeletal: Negative for back pain.  Skin: Negative for rash.  Neurological: Negative for headaches.  Hematological: Does not bruise/bleed easily.  Psychiatric/Behavioral: Negative for confusion.     Physical Exam Updated Vital Signs BP 141/67 (BP Location: Left Arm)   Pulse 70   Temp 97.5 F (36.4 C) (Oral)   Resp 20   Ht 5\' 1"  (1.549 m)   Wt 75.8 kg   SpO2 97%   BMI 31.55 kg/m   Physical Exam  Constitutional: She is oriented to person, place, and time. She appears well-developed and well-nourished. No distress.  HENT:  Head: Normocephalic and atraumatic.  Mouth/Throat: Oropharynx is clear and moist.  Eyes: Conjunctivae and EOM are normal. Pupils are equal, round, and reactive to light.  Neck: Normal range of motion. Neck supple.  Cardiovascular: Normal rate and regular rhythm.   Pulmonary/Chest: Effort normal and breath sounds normal. She has no wheezes.  Abdominal: Soft. Bowel sounds are normal. There  is no tenderness.  Musculoskeletal: Normal range of motion. She exhibits no edema.  Neurological: She is alert and oriented to person, place, and time. No cranial nerve deficit or sensory deficit. She exhibits normal muscle tone. Coordination normal.  Skin: Skin is warm. No rash noted.  Nursing note and vitals reviewed.    ED Treatments / Results  Labs (all labs ordered are listed, but only abnormal results are displayed) Labs Reviewed  CBC WITH DIFFERENTIAL/PLATELET - Abnormal; Notable for the following:       Result Value   RBC 3.55 (*)    Hemoglobin 9.1 (*)    HCT 28.4 (*)    MCH 25.6 (*)    RDW 15.7 (*)    All other components within normal limits  COMPREHENSIVE METABOLIC PANEL - Abnormal; Notable for the following:    Sodium 131 (*)    Chloride 98 (*)    Creatinine, Ser 1.03 (*)    GFR calc non Af Amer 50 (*)    GFR calc Af Amer 57 (*)    All other components within normal limits  BRAIN NATRIURETIC PEPTIDE - Abnormal; Notable for the following:    B Natriuretic Peptide 338.0 (*)    All other components within normal limits  TROPONIN I    EKG  EKG Interpretation  Date/Time:  Monday March 20 2016 14:04:26 EST Ventricular Rate:  70 PR Interval:    QRS Duration: 72 QT Interval:  446 QTC Calculation: 481 R Axis:   13 Text Interpretation:  Atrial fibrillation Low voltage QRS Nonspecific ST and T wave abnormality Prolonged QT Abnormal ECG Confirmed by  Nathyn Luiz  MD, Nicki Reaper [phone]) on 03/20/2016 4:58:41 PM       Radiology Dg Chest 2 View  Result Date: 03/20/2016 CLINICAL DATA:  82 y/o F; dry cough and shortness of breath with exertion for 1 week. EXAM: CHEST  2 VIEW COMPARISON:  02/16/2016 chest radiograph FINDINGS: Stable mildly enlarged cardiac silhouette given projection and technique. Aortic atherosclerosis with arch calcification. Increased interstitial markings from prior radiographs. No new consolidation or effusion. Status post cholecystectomy. Bones are  unremarkable. IMPRESSION: Diminished interstitial markings from prior radiographs with residua pulmonary venous hypertension. Stable borderline cardiomegaly. Aortic atherosclerosis. No new focal consolidation or effusion. Electronically Signed   By: Kristine Garbe M.D.   On: 03/20/2016 14:34    Procedures Procedures (including critical care time)  Medications Ordered in ED Medications - No data to display   Initial Impression / Assessment and Plan / ED Course  I have reviewed the triage vital signs and the nursing notes.  Pertinent labs & imaging results that were available during my care of the patient were reviewed by me and considered in my medical decision making (see chart for details).  Clinical Course     The patient presenting with the complaint of shortness of breath up quickly with exertion for 1 week. Patient has a home health nurse got a reading of 89% of oxygen sats this morning. Patient referred in here arrived here with 97% on room air. At rest she pretty much stayed above 95% only ambulator she goes down to 88% and gets very short of breath. Workup without any acute findings on chest x-ray no pneumonia no obvious pulmonary edema patient's BNP is up a little bit in the 300s. Has been as low as 56 in the past. Patient has no chest pain. Patient does have a diagnosis of COPD. But not on specific treatments for it no wheezing. Does have a history of CHF. Ejection fraction recorded of 55-60%.  The patient will require admission probably needs home oxygen. Discussed with the hospitalist and they will mid to [place]. We'll continue her on 2 L of oxygen.  Final Clinical Impressions(s) / ED Diagnoses   Final diagnoses:  Hypoxia  SOB (shortness of breath)    New Prescriptions New Prescriptions   No medications on file     Tonya Sorrow, MD 03/20/16 2013

## 2016-03-20 NOTE — ED Triage Notes (Signed)
PT c/o dry cough with SOB on exertion x1 week. PT also states that her home health nurse got a reading of 89% O2 this am and called her primary doctor and they told her to come to ED for eval. PT 97% on room air in triage at this time.

## 2016-03-20 NOTE — H&P (Signed)
History and Physical    TAWAN CORKERN YNW:[id number] DOB: 07-14-1932 DOA: 03/20/2016  PCP: [name]  Patient coming from:  home  Chief Complaint:  sob  HPI: [name] is a 82 y.o. female with medical history significant of chronic diastolic CHF, CKD, pafib on eloquis, HTN, COPD comes in sob expecially with exertion.  Pt does not require supplemental oxygen at home.  Her dry weight is around 167 lbs. after reviewing her chart.  She denies fevers.  Has a dry cough.  No weight gain.  She had a cardiac cath 12/16/15 with final report saying dark area below in quotations.  She has had 3 pillow orthopnea for about 5 years, but has had PND for 3 weeks.  She has to sit up in recliner to sleep.  She saw cardiology as outpatient follow up last week with no change in her medications.  She denies wheezing since starting albuterol TID.  Her weight has been stable and her cut off mark to call her MD (5lb wt gain) and to take an extra lasix pill is 173lbs.  She has been below 170lbs for over a month.  She was hospitalized last month for chf exac she says she was fine when she left the hospital and was good for about a week, then her sob started to get worse again over the last 3 weeks.  Pt was ambulated in the ED and her oxygen sats were 88% RA with activity, and was therefore referred for admission for her hypoxia w exertion.  No chest pain.  "Normal LV function with an ejection fraction of 60-65%.  There is evidence for left ventricular hypertrophy.  Coronary obstructive disease with evidence for smooth eccentric plaque in the proximal LAD of 20%, 20% stenosis in the inferior branch of the first diagonal vessel; a widely patent previously placed left circumflex stent extending from the ostium to the first marginal takeoff in a large dominant circumflex vessel with 20% distal narrowing; and a small nondominant RCA with 80% ostial stenosis.  RECOMMENDATION: Medical therapy.  Of note, the patient  does have significant change in respiratory intrathoracic pressure from inspiratory and expiratory cycles."    Review of Systems: As per HPI otherwise 10 point review of systems negative.   Past Medical History:  Diagnosis Date  . Anxiety   . Arthritis   . Bursitis    left shoulder  . Cataract   . CHF (congestive heart failure) (HCC)    EF 55-60%  . Complication of anesthesia   . COPD (chronic obstructive pulmonary disease) ([place])   . Coronary atherosclerosis of native coronary artery    a. DES to CX, moderately severe stenosis RCA, mild stenosis LAD 04/2013  . Depression   . Dysphagia, unspecified(787.20)   . GERD (gastroesophageal reflux disease)    Hx Schatzki's ring, multiple EGD/ED last 01/06/2004  . Headache   . Heart disease   . Heart murmur    'a littel'  . HTN (hypertension)    Hx of it  . Hyperlipemia   . Hyperlipidemia   . Internal hemorrhoids without mention of complication   . MI (myocardial infarction) 2006  . Microscopic colitis 2003  . Other and unspecified hyperlipidemia   . Panic disorder without agoraphobia   . Paresthesia    hands, feet  . Pneumonia 12/2011  . PONV (postoperative nausea and vomiting)    'a little nausea"  . PVD (peripheral vascular disease) ([place])   . S/P colonoscopy 09/27/2001  internal hemorrhoids, desc colon inflam polyp, SB BX-chronic duodenitis, colitis  . Shortness of breath   . Sleep disorder    obstructive  . Thyroid disease    recent abnl TSH per pt    Past Surgical History:  Procedure Laterality Date  . ABDOMINAL HYSTERECTOMY    . ANTERIOR AND POSTERIOR REPAIR     with resection of vagina  . APPENDECTOMY    . BACK SURGERY    . BIOPSY  07/05/2015   Procedure: BIOPSY;  Surgeon: Daneil Dolin, MD;  [facility]: [facility];  Service: Endoscopy;;  gastric polyp biopsy, ascending colon biopsy  . BLADDER SUSPENSION  11/09/2011   Procedure: TRANSVAGINAL TAPE (TVT) PROCEDURE;  Surgeon: Marissa Nestle, MD;  Location: [facility]  ORS;  Service: Urology;  Laterality: N/A;  . bladder tack  06/2010  . BREAST LUMPECTOMY  1998   left, benign  . CARDIAC CATHETERIZATION    . CARDIAC CATHETERIZATION    . CARDIAC CATHETERIZATION N/A 12/16/2015   Procedure: Left Heart Cath and Coronary Angiography;  Surgeon: Troy Sine, MD;  Location: [place] CV LAB;  Service: Cardiovascular;  Laterality: N/A;  . [place]   left  . CHOLECYSTECTOMY  1998  . COLONOSCOPY  03/16/2011   multiple hyperplastic colon polyps, sigmoid diverticulosis, melanosis coli  . COLONOSCOPY WITH PROPOFOL N/A 07/05/2015   RMR:one 5 mm polyp in descending colon  . CORONARY ANGIOPLASTY WITH STENT PLACEMENT    . ESOPHAGEAL DILATION N/A 07/05/2015   Procedure: ESOPHAGEAL DILATION;  Surgeon: Daneil Dolin, MD;  [facility];  Service: Endoscopy;  Laterality: N/A;  . ESOPHAGOGASTRODUODENOSCOPY (EGD) WITH PROPOFOL N/A 07/05/2015   HUT:MLYYTK  . JOINT REPLACEMENT Right 2007  . left hand surgery    . LEFT HEART CATHETERIZATION WITH CORONARY ANGIOGRAM N/A 05/14/2013   Procedure: LEFT HEART CATHETERIZATION WITH CORONARY ANGIOGRAM;  Surgeon: Blane Ohara, MD;  Location: [facility] CATH LAB;  Service: Cardiovascular;  Laterality: N/A;  . left rotator cuff surgery    . LUMBAR LAMINECTOMY/DECOMPRESSION MICRODISCECTOMY N/A 10/11/2012   Procedure: LUMBAR LAMINECTOMY/DECOMPRESSION MICRODISCECTOMY 2 LEVELS;  Surgeon: Floyce Stakes, MD;  Location: [place] NEURO ORS;  Service: Neurosurgery;  Laterality: N/A;  L3-4 L4-5 Laminectomy  . LUMBAR WOUND DEBRIDEMENT N/A 09/27/2015   Procedure: Exploration of Lumbar Wound w/ Repair CSF Leak/Lumbar Drain Placement;  Surgeon: Leeroy Cha, MD;  Location: [place] NEURO ORS;  Service: Neurosurgery;  Laterality: N/A;  . MALONEY DILATION  03/16/2011   Gastritis. No H.pylori on bx. 35F maloney dilation with disruption of  occult cevical esophageal web  . NASAL SINUS SURGERY    . right knee replacement  2007  . right leg  benign tumor    . SHOULDER SURGERY Left   . TONSILLECTOMY    . unspecified area, hysterectomy  1972   partial     reports that she quit smoking about 14 years ago. Her smoking use included Cigarettes. She started smoking about 68 years ago. She has a 64.00 pack-year smoking history. She has never used smokeless tobacco. She reports that she does not drink alcohol or use drugs.  Allergies  Allergen Reactions  . Cephalosporins Diarrhea and Nausea Only    Lightheaded  . Levaquin [Levofloxacin In D5w] Swelling  . Macrodantin [Nitrofurantoin Macrocrystal] Swelling  . Phenothiazines Anaphylaxis and Hives  . Polysorbate Anaphylaxis  . Prednisone Shortness Of Breath  . Buspirone Itching  . Cardura [Doxazosin Mesylate] Itching  . Codeine Itching  . Acyclovir  And Related   . Prochlorperazine Other (See Comments)    unknown  . Ranexa [Ranolazine]     Severe drop in BP  . Atorvastatin Hives    Cramping; tolerates Crestor ok  . Ofloxacin Rash  . Other Itching and Rash    "WOOL"= make skin look like it has been burned  . Penicillins Other (See Comments)    Causes redness all over. Has patient had a PCN reaction causing immediate rash, facial/tongue/throat swelling, SOB or lightheadedness with hypotension: No Has patient had a PCN reaction causing severe rash involving mucus membranes or skin necrosis: No Has patient had a PCN reaction that required hospitalization No Has patient had a PCN reaction occurring within the last 10 years: No If all of the above answers are "NO", then may proceed with Cephalosporin use.   . Pimozide Hives and Itching    Family History  Problem Relation Age of Onset  . Stroke Mother   . Parkinson's disease Father   . Coronary artery disease Other     family Hx-sons  . Cancer Other   . Stroke Other     family Hx  . Hypertension Other     family Hx  . Diabetes Brother   . Heart disease Son     before age 19  . Diabetes Son   . Stroke Daughter 15     Prior to Admission medications   Medication Sig Start Date End Date Taking? Authorizing Provider  acetaminophen (TYLENOL) 500 MG tablet Take 500 mg by mouth daily as needed for headache.    Yes Historical Provider, MD  ALPRAZolam (XANAX) 0.25 MG tablet Take 0.25 mg by mouth 2 (two) times daily as needed for anxiety.    Yes Historical Provider, MD  apixaban (ELIQUIS) 5 MG TABS tablet Take 1 tablet (5 mg total) by mouth 2 (two) times daily. 02/20/16  Yes Kathie Dike, MD  aspirin 81 MG EC tablet Take 81 mg by mouth every morning.    Yes Historical Provider, MD  carvedilol (COREG) 25 MG tablet Take 12.5 mg by mouth 2 (two) times daily with a meal.    Yes Historical Provider, MD  diltiazem (CARDIZEM CD) 240 MG 24 hr capsule Take 1 capsule (240 mg total) by mouth daily. 02/21/16  Yes Kathie Dike, MD  DULoxetine (CYMBALTA) 60 MG capsule Take 60 mg by mouth at bedtime.    Yes Historical Provider, MD  fluticasone (FLONASE) 50 MCG/ACT nasal spray Place 2 sprays into both nostrils daily.    Yes Historical Provider, MD  furosemide (LASIX) 20 MG tablet Take 1 tablet (20 mg total) by mouth daily. 05/25/15  Yes Lendon Colonel, NP  gabapentin (NEURONTIN) 300 MG capsule Take 1 capsule (300 mg total) by mouth 3 (three) times daily. 10/08/15  Yes Kevan Ny Ditty, MD  hydrocortisone (ANUSOL-HC) 2.5 % rectal cream Place 1 application rectally 2 (two) times daily. 03/01/16  Yes Annitta Needs, NP  ipratropium-albuterol (DUONEB) 0.5-2.5 (3) MG/3ML SOLN Take 3 mLs by nebulization every 6 (six) hours. 12/18/15  Yes Belkys A Regalado, MD  loratadine (CLARITIN) 10 MG tablet Take 10 mg by mouth every morning.   Yes Historical Provider, MD  Melatonin 3 MG CAPS Take 1 capsule by mouth at bedtime.   Yes Historical Provider, MD  nitroGLYCERIN (NITROSTAT) 0.4 MG SL tablet Place 0.4 mg under the tongue every 5 (five) minutes as needed for chest pain. Reported on 08/04/2015   Yes Historical Provider, MD  Oxycodone HCl  10  MG TABS Take 5-10 mg by mouth every 4 (four) hours as needed. For pain 03/13/16  Yes Historical Provider, MD  pantoprazole (PROTONIX) 40 MG tablet Take 40 mg by mouth every morning.    Yes Historical Provider, MD  potassium chloride (K-DUR,KLOR-CON) 10 MEQ tablet TAKE TWO (2) TABLETS BY MOUTH DAILY. 04/30/15  Yes Herminio Commons, MD  HYDROcodone-acetaminophen (NORCO/VICODIN) 5-325 MG tablet Take 1 tablet by mouth every 6 (six) hours as needed for severe pain. Patient not taking: Reported on 03/20/2016 02/20/16   Kathie Dike, MD    Physical Exam: Vitals:   03/20/16 1401 03/20/16 1730 03/20/16 1943  BP: 136/61 130/72 141/67  Pulse: 67 (!) 57 70  Resp: 22  20  Temp: 97.6 F (36.4 C)  97.5 F (36.4 C)  TempSrc: Oral  Oral  SpO2: 97% 97% 97%  Weight: 75.8 kg (167 lb)    Height: 5\' 1"  (1.549 m)      Constitutional: NAD, calm, comfortable Vitals:   03/20/16 1401 03/20/16 1730 03/20/16 1943  BP: 136/61 130/72 141/67  Pulse: 67 (!) 57 70  Resp: 22  20  Temp: 97.6 F (36.4 C)  97.5 F (36.4 C)  TempSrc: Oral  Oral  SpO2: 97% 97% 97%  Weight: 75.8 kg (167 lb)    Height: 5\' 1"  (1.549 m)     Eyes: PERRL, lids and conjunctivae normal ENMT: Mucous membranes are moist. Posterior pharynx clear of any exudate or lesions.Normal dentition.  Neck: normal, supple, no masses, no thyromegaly Respiratory: clear to auscultation bilaterally, no wheezing, no crackles. Normal respiratory effort. No accessory muscle use.  Cardiovascular: Regular rate and rhythm, no murmurs / rubs / gallops. No extremity edema. 2+ pedal pulses. No carotid bruits.  Abdomen: no tenderness, no masses palpated. No hepatosplenomegaly. Bowel sounds positive.  Musculoskeletal: no clubbing / cyanosis. No joint deformity upper and lower extremities. Good ROM, no contractures. Normal muscle tone.  Skin: no rashes, lesions, ulcers. No induration Neurologic: CN 2-12 grossly intact. Sensation intact, DTR normal. Strength 5/5 in  all 4.  Psychiatric: Normal judgment and insight. Alert and oriented x 3. Normal mood.    Labs on Admission: I have personally reviewed following labs and imaging studies  CBC:  Recent Labs Lab 03/20/16 1552  WBC 7.1  NEUTROABS 4.1  HGB 9.1*  HCT 28.4*  MCV 80.0  PLT 314   Basic Metabolic Panel:  Recent Labs Lab 03/20/16 1552  NA 131*  K 3.8  CL 98*  CO2 25  GLUCOSE 91  BUN 16  CREATININE 1.03*  CALCIUM 9.1   GFR: Estimated Creatinine Clearance: 39.9 mL/min (by C-G formula based on SCr of 1.03 mg/dL (H)). Liver Function Tests:  Recent Labs Lab 03/20/16 1552  AST 17  ALT 14  ALKPHOS 94  BILITOT 0.6  PROT 7.3  ALBUMIN 3.8   Cardiac Enzymes:  Recent Labs Lab 03/20/16 1552  TROPONINI <0.03    Radiological Exams on Admission: Dg Chest 2 View  Result Date: 03/20/2016 CLINICAL DATA:  82 y/o F; dry cough and shortness of breath with exertion for 1 week. EXAM: CHEST  2 VIEW COMPARISON:  02/16/2016 chest radiograph FINDINGS: Stable mildly enlarged cardiac silhouette given projection and technique. Aortic atherosclerosis with arch calcification. Increased interstitial markings from prior radiographs. No new consolidation or effusion. Status post cholecystectomy. Bones are unremarkable. IMPRESSION: Diminished interstitial markings from prior radiographs with residua pulmonary venous hypertension. Stable borderline cardiomegaly. Aortic atherosclerosis. No new focal consolidation or effusion. Electronically Signed  By: Kristine Garbe M.D.   On: 03/20/2016 14:34   EKG: Independently reviewed.  Afib, rate 70.  No acute issues Old chart reviewed Case discussed with dr Rogene Houston  Assessment/Plan 82 yo female with h/o CHF, COPD, pafib on eloquis, CKD comes in with exertional dyspnea and mild hypoxia on exertion  Principal Problem:   SOB (shortness of breath) with exertion and hypoxia - pt is euvolemic and at her dry weight.  Hard to tell if her symptoms are  due to underlying lung disease or cardiac or both.  Have given her an extra dose of lasix tonight 40mg  iv once, she is on lasix 20mg  po daily.  She is on duonebs but not on any other inhalers for COPD.  Depending on how she responds to increasing diuretics would consider adding spiriva.  She may need home oxygen with activity at some point.  No evidence of infection.  Active Problems:   Chronic diastolic heart failure (HCC)- stable   PAF (paroxysmal atrial fibrillation) (HCC)- stable   Mild cognitive impairment- noted   Anxiety- noted   CAD in native artery- stable   Chronic kidney disease, stage III (moderate)- stable at baseline   Essential hypertension- stable    DVT prophylaxis: eloquis Code Status:  full Family Communication: none Disposition Plan:  Per day team Consults called:  none Admission status:  observation   Boden Stucky A MD [facility]  If 7PM-7AM, please contact night-coverage [email or web address] Password [facility]  03/20/2016, 8:33 PM

## 2016-03-21 ENCOUNTER — Ambulatory Visit: Payer: Medicare Other | Admitting: Nurse Practitioner

## 2016-03-21 ENCOUNTER — Encounter: Payer: Self-pay | Admitting: Nurse Practitioner

## 2016-03-21 DIAGNOSIS — R32 Unspecified urinary incontinence: Secondary | ICD-10-CM | POA: Diagnosis not present

## 2016-03-21 DIAGNOSIS — I482 Chronic atrial fibrillation: Secondary | ICD-10-CM | POA: Diagnosis not present

## 2016-03-21 DIAGNOSIS — R0602 Shortness of breath: Secondary | ICD-10-CM | POA: Diagnosis not present

## 2016-03-21 DIAGNOSIS — I10 Essential (primary) hypertension: Secondary | ICD-10-CM

## 2016-03-21 DIAGNOSIS — R0902 Hypoxemia: Secondary | ICD-10-CM | POA: Diagnosis not present

## 2016-03-21 DIAGNOSIS — I5033 Acute on chronic diastolic (congestive) heart failure: Secondary | ICD-10-CM | POA: Diagnosis not present

## 2016-03-21 DIAGNOSIS — N183 Chronic kidney disease, stage 3 (moderate): Secondary | ICD-10-CM | POA: Diagnosis not present

## 2016-03-21 DIAGNOSIS — F419 Anxiety disorder, unspecified: Secondary | ICD-10-CM | POA: Diagnosis not present

## 2016-03-21 DIAGNOSIS — Z6830 Body mass index (BMI) 30.0-30.9, adult: Secondary | ICD-10-CM | POA: Diagnosis not present

## 2016-03-21 DIAGNOSIS — M545 Low back pain: Secondary | ICD-10-CM | POA: Diagnosis not present

## 2016-03-21 LAB — BASIC METABOLIC PANEL
ANION GAP: 9 (ref 5–15)
BUN: 15 mg/dL (ref 6–20)
CALCIUM: 9 mg/dL (ref 8.9–10.3)
CHLORIDE: 96 mmol/L — AB (ref 101–111)
CO2: 28 mmol/L (ref 22–32)
CREATININE: 1.04 mg/dL — AB (ref 0.44–1.00)
GFR calc non Af Amer: 49 mL/min — ABNORMAL LOW (ref >60)
GFR, EST AFRICAN AMERICAN: 57 mL/min — AB (ref >60)
Glucose, Bld: 105 mg/dL — ABNORMAL HIGH (ref 65–99)
Potassium: 3.4 mmol/L — ABNORMAL LOW (ref 3.5–5.1)
SODIUM: 133 mmol/L — AB (ref 135–145)

## 2016-03-21 LAB — CBC
HCT: 28.3 % — ABNORMAL LOW (ref 36.0–46.0)
HEMOGLOBIN: 9 g/dL — AB (ref 12.0–15.0)
MCH: 25.4 pg — ABNORMAL LOW (ref 26.0–34.0)
MCHC: 31.8 g/dL (ref 30.0–36.0)
MCV: 79.7 fL (ref 78.0–100.0)
PLATELETS: 353 10*3/uL (ref 150–400)
RBC: 3.55 MIL/uL — AB (ref 3.87–5.11)
RDW: 15.5 % (ref 11.5–15.5)
WBC: 6.6 10*3/uL (ref 4.0–10.5)

## 2016-03-21 MED ORDER — IPRATROPIUM-ALBUTEROL 0.5-2.5 (3) MG/3ML IN SOLN
3.0000 mL | Freq: Three times a day (TID) | RESPIRATORY_TRACT | Status: DC
  Administered 2016-03-22 (×2): 3 mL via RESPIRATORY_TRACT
  Filled 2016-03-21 (×2): qty 3

## 2016-03-21 MED ORDER — PREDNISONE 20 MG PO TABS: 50.0000 mg | ORAL_TABLET | Freq: Every day | ORAL | Status: DC

## 2016-03-21 MED ORDER — IPRATROPIUM-ALBUTEROL 0.5-2.5 (3) MG/3ML IN SOLN
3.0000 mL | Freq: Four times a day (QID) | RESPIRATORY_TRACT | Status: DC
Start: 2016-03-21 — End: 2016-03-21
  Administered 2016-03-21 (×3): 3 mL via RESPIRATORY_TRACT
  Filled 2016-03-21 (×3): qty 3

## 2016-03-21 NOTE — Care Management Obs Status (Signed)
[place] NOTIFICATION   Patient Details  Name: ARYANNE GILLELAND MRN: [id number] Date of Birth: 23-Jan-1933   Medicare Observation Status Notification Given:  Yes    Braidan Ricciardi, Chauncey Reading, RN 03/21/2016, 11:52 AM

## 2016-03-21 NOTE — Care Management Note (Addendum)
Case Management Note  Patient Details  Name: KHALIE WINCE MRN: [id number] Date of Birth: [birth year]  Subjective/Objective:   Patient adm from home SOB. Lives with daughter and grandson. Has cane and walker PTA. Has PCP, transportation and insurance with prescription coverage. Has neb machine at home, no oxygent. Is active with [facility] for [facility] RN. Will resume. Will need home oxygen assessment prior to discharge.                 Action/Plan: Anticipate DC home with self care. Following for oxygen needs.  Expected Discharge Date:  03/21/16               Expected Discharge Plan:  Home/Self Care  In-House Referral:  NA  Discharge planning Services  CM Consult  Post Acute Care Choice:    Choice offered to:     DME Arranged:    DME Agency:     HH Arranged:    HH Agency:     Status of Service:  In process, will continue to follow  If discussed at Long Length of Stay Meetings, dates discussed:    Additional Comments:  Krystale Rinkenberger, Chauncey Reading, RN 03/21/2016, 11:49 AM

## 2016-03-21 NOTE — Progress Notes (Signed)
PROGRESS NOTE    [name]  NTZ:[id number] DOB: [birth year] DOA: 03/20/2016 PCP: [name]     Brief Narrative:  82 y/o woman admitted from home on 12/4 with SOB. Found to have COPD with exacerbation. Admission requested.   Assessment & Plan:   Principal Problem:   SOB (shortness of breath) Active Problems:   Chronic diastolic heart failure (HCC)   PAF (paroxysmal atrial fibrillation) (HCC)   Mild cognitive impairment   Anxiety   CAD in native artery   Chronic kidney disease, stage III (moderate)   Essential hypertension   COPD with acute exacerbation -Still some wheezing, not placed on steroids on admission. -Will add prednisone. -Continue nebs. -Seems to be improving. -Will need to determine oxygen requirements prior to DC home.  Chronic diastolic CHF -Compensated  CKD Stage III -At baseline  HTN -Well controlled  PAF -Rate controlled. -AC on eliquis   DVT prophylaxis: eliquis Code Status: full code Family Communication: patient only Disposition Plan: likely home in 24 hours  Consultants:   None  Procedures:   None  Antimicrobials:  Anti-infectives    None       Subjective: Feels much improved altho still a little SOB especially with ambulation  Objective: Vitals:   03/21/16 0500 03/21/16 0841 03/21/16 1337 03/21/16 1455  BP:   (!) 121/41   Pulse: 76  72   Resp: 20  20   Temp: 98.2 F (36.8 C)  97.6 F (36.4 C)   TempSrc: Oral  Oral   SpO2: 95% 97% 100% 96%  Weight: 76.1 kg (167 lb 12.3 oz)     Height:        Intake/Output Summary (Last 24 hours) at 03/21/16 1651 Last data filed at 03/21/16 1300  Gross per 24 hour  Intake              720 ml  Output             2050 ml  Net            -1330 ml   Filed Weights   03/20/16 1401 03/20/16 2207 03/21/16 0500  Weight: 75.8 kg (167 lb) 76.7 kg (169 lb 1.5 oz) 76.1 kg (167 lb 12.3 oz)    Examination:  General exam: Alert, awake, oriented x 3 Respiratory system:  mild bilateral wheezes and ronchi Cardiovascular system:RRR. No murmurs, rubs, gallops. Gastrointestinal system: Abdomen is nondistended, soft and nontender. No organomegaly or masses felt. Normal bowel sounds heard. Central nervous system: Alert and oriented. No focal neurological deficits. Extremities: No C/C/E, +pedal pulses Skin: No rashes, lesions or ulcers Psychiatry: Judgement and insight appear normal. Mood & affect appropriate.     Data Reviewed: I have personally reviewed following labs and imaging studies  CBC:  Recent Labs Lab 03/20/16 1552 03/21/16 0601  WBC 7.1 6.6  NEUTROABS 4.1  --   HGB 9.1* 9.0*  HCT 28.4* 28.3*  MCV 80.0 79.7  PLT 364 675   Basic Metabolic Panel:  Recent Labs Lab 03/20/16 1552 03/21/16 0601  NA 131* 133*  K 3.8 3.4*  CL 98* 96*  CO2 25 28  GLUCOSE 91 105*  BUN 16 15  CREATININE 1.03* 1.04*  CALCIUM 9.1 9.0   GFR: Estimated Creatinine Clearance: 39.6 mL/min (by C-G formula based on SCr of 1.04 mg/dL (H)). Liver Function Tests:  Recent Labs Lab 03/20/16 1552  AST 17  ALT 14  ALKPHOS 94  BILITOT 0.6  PROT 7.3  ALBUMIN 3.8  No results for input(s): LIPASE, AMYLASE in the last 168 hours. No results for input(s): AMMONIA in the last 168 hours. Coagulation Profile: No results for input(s): INR, PROTIME in the last 168 hours. Cardiac Enzymes:  Recent Labs Lab 03/20/16 1552  TROPONINI <0.03   BNP (last 3 results) No results for input(s): PROBNP in the last 8760 hours. HbA1C: No results for input(s): HGBA1C in the last 72 hours. CBG: No results for input(s): GLUCAP in the last 168 hours. Lipid Profile: No results for input(s): CHOL, HDL, LDLCALC, TRIG, CHOLHDL, LDLDIRECT in the last 72 hours. Thyroid Function Tests: No results for input(s): TSH, T4TOTAL, FREET4, T3FREE, THYROIDAB in the last 72 hours. Anemia Panel: No results for input(s): VITAMINB12, FOLATE, FERRITIN, TIBC, IRON, RETICCTPCT in the last 72  hours. Urine analysis:    Component Value Date/Time   COLORURINE YELLOW 10/27/2015 1345   APPEARANCEUR HAZY (A) 10/27/2015 1345   LABSPEC 1.010 10/27/2015 1345   PHURINE 5.5 10/27/2015 1345   GLUCOSEU NEGATIVE 10/27/2015 1345   HGBUR TRACE (A) 10/27/2015 1345   BILIRUBINUR NEGATIVE 10/27/2015 1345   KETONESUR NEGATIVE 10/27/2015 1345   PROTEINUR NEGATIVE 10/27/2015 1345   UROBILINOGEN 0.2 02/21/2015 2028   NITRITE NEGATIVE 10/27/2015 1345   LEUKOCYTESUR MODERATE (A) 10/27/2015 1345   Sepsis Labs: @LABRCNTIP (procalcitonin:4,lacticidven:4)  )No results found for this or any previous visit (from the past 240 hour(s)).       Radiology Studies: Dg Chest 2 View  Result Date: 03/20/2016 CLINICAL DATA:  82 y/o F; dry cough and shortness of breath with exertion for 1 week. EXAM: CHEST  2 VIEW COMPARISON:  02/16/2016 chest radiograph FINDINGS: Stable mildly enlarged cardiac silhouette given projection and technique. Aortic atherosclerosis with arch calcification. Increased interstitial markings from prior radiographs. No new consolidation or effusion. Status post cholecystectomy. Bones are unremarkable. IMPRESSION: Diminished interstitial markings from prior radiographs with residua pulmonary venous hypertension. Stable borderline cardiomegaly. Aortic atherosclerosis. No new focal consolidation or effusion. Electronically Signed   By: Kristine Garbe M.D.   On: 03/20/2016 14:34        Scheduled Meds: . apixaban  5 mg Oral BID  . aspirin EC  81 mg Oral q morning - 10a  . carvedilol  12.5 mg Oral BID WC  . diltiazem  240 mg Oral Daily  . DULoxetine  60 mg Oral QHS  . furosemide  20 mg Oral Daily  . gabapentin  300 mg Oral TID  . ipratropium-albuterol  3 mL Nebulization Q6H  . loratadine  10 mg Oral q morning - 10a  . pantoprazole  40 mg Oral q morning - 10a  . sodium chloride flush  3 mL Intravenous Q12H   Continuous Infusions:   LOS: 0 days    Time spent: 25  minutes. Greater than 50% of this time was spent in direct contact with the patient coordinating care.     Lelon Frohlich, MD [facility] Pager [phone]  If 7PM-7AM, please contact night-coverage [email or web address] Password [email or web address] 03/21/2016, 4:51 PM

## 2016-03-22 DIAGNOSIS — R0602 Shortness of breath: Secondary | ICD-10-CM

## 2016-03-22 DIAGNOSIS — R0902 Hypoxemia: Secondary | ICD-10-CM | POA: Diagnosis not present

## 2016-03-22 MED ORDER — POTASSIUM CHLORIDE CRYS ER 20 MEQ PO TBCR
20.0000 meq | EXTENDED_RELEASE_TABLET | Freq: Once | ORAL | Status: AC
  Administered 2016-03-22: 20 meq via ORAL
  Filled 2016-03-22: qty 1

## 2016-03-22 NOTE — Discharge Summary (Signed)
Physician Discharge Summary  [name] ATF:[id number] DOB: [birth year] DOA: 03/20/2016  PCP: [name]  Admit date: 03/20/2016 Discharge date: 03/22/2016  Time spent: > 35 minutes  Recommendations for Outpatient Follow-up:  1. Continue routine pft testing   Discharge Diagnoses:  Principal Problem:   SOB (shortness of breath) Active Problems:   Chronic diastolic heart failure (HCC)   PAF (paroxysmal atrial fibrillation) (HCC)   Mild cognitive impairment   Anxiety   CAD in native artery   Chronic kidney disease, stage III (moderate)   Essential hypertension   Discharge Condition: stable  Diet recommendation: low sodium  Filed Weights   03/20/16 2207 03/21/16 0500 03/22/16 0353  Weight: 76.7 kg (169 lb 1.5 oz) 76.1 kg (167 lb 12.3 oz) 74.5 kg (164 lb 3.2 oz)    History of present illness:  82 y.o. female with medical history significant of chronic diastolic CHF, CKD, p afib on eliquis, HTN, COPD comes in sob expecially with exertion.  Hospital Course:  COPD exacerbation - Patient reports that on day of discharge she feels like her breathing is back to baseline and is requesting discharge. - Patient had 1 dose of prednisone yesterday. No wheezing on day of discharge such will not sent home on additional prednisone. - Improved off of antibiotics as such will not start antibiotics on day of discharge.  For other known medical conditions we'll continue home medication regimen  Procedures:  None  Consultations:  None  Discharge Exam: Vitals:   03/21/16 2140 03/22/16 0353  BP: (!) 125/56 (!) 135/56  Pulse: 60 70  Resp: 20 20  Temp: 97.5 F (36.4 C) 97.6 F (36.4 C)    General: Pt in nad, alert and awake Cardiovascular: rrr, no rubs Respiratory: no increased wob, no wheezes  Discharge Instructions   Discharge Instructions    Call MD for:  difficulty breathing, headache or visual disturbances    Complete by:  As directed    Call MD for:   temperature >100.4    Complete by:  As directed    Diet - low sodium heart healthy    Complete by:  As directed    Discharge instructions    Complete by:  As directed    Please be sure to follow-up with her primary care physician in the next one or 2 weeks or sooner should any new concerns arise.   Increase activity slowly    Complete by:  As directed      Current Discharge Medication List    CONTINUE these medications which have NOT CHANGED   Details  acetaminophen (TYLENOL) 500 MG tablet Take 500 mg by mouth daily as needed for headache.     ALPRAZolam (XANAX) 0.25 MG tablet Take 0.25 mg by mouth 2 (two) times daily as needed for anxiety.     apixaban (ELIQUIS) 5 MG TABS tablet Take 1 tablet (5 mg total) by mouth 2 (two) times daily. Qty: 60 tablet, Refills: 0    carvedilol (COREG) 25 MG tablet Take 12.5 mg by mouth 2 (two) times daily with a meal.     diltiazem (CARDIZEM CD) 240 MG 24 hr capsule Take 1 capsule (240 mg total) by mouth daily. Qty: 30 capsule, Refills: 0    DULoxetine (CYMBALTA) 60 MG capsule Take 60 mg by mouth at bedtime.     fluticasone (FLONASE) 50 MCG/ACT nasal spray Place 2 sprays into both nostrils daily.     furosemide (LASIX) 20 MG tablet Take 1 tablet (20 mg  total) by mouth daily. Qty: 90 tablet, Refills: 3    gabapentin (NEURONTIN) 300 MG capsule Take 1 capsule (300 mg total) by mouth 3 (three) times daily. Qty: 90 capsule, Refills: 2    hydrocortisone (ANUSOL-HC) 2.5 % rectal cream Place 1 application rectally 2 (two) times daily. Qty: 30 g, Refills: 1    ipratropium-albuterol (DUONEB) 0.5-2.5 (3) MG/3ML SOLN Take 3 mLs by nebulization every 6 (six) hours. Qty: 360 mL, Refills: 0    loratadine (CLARITIN) 10 MG tablet Take 10 mg by mouth every morning.    Melatonin 3 MG CAPS Take 1 capsule by mouth at bedtime.    nitroGLYCERIN (NITROSTAT) 0.4 MG SL tablet Place 0.4 mg under the tongue every 5 (five) minutes as needed for chest pain. Reported  on 08/04/2015    Oxycodone HCl 10 MG TABS Take 5-10 mg by mouth every 4 (four) hours as needed. For pain Refills: 0    pantoprazole (PROTONIX) 40 MG tablet Take 40 mg by mouth every morning.     potassium chloride (K-DUR,KLOR-CON) 10 MEQ tablet TAKE TWO (2) TABLETS BY MOUTH DAILY. Qty: 60 tablet, Refills: 6      STOP taking these medications     aspirin 81 MG EC tablet      HYDROcodone-acetaminophen (NORCO/VICODIN) 5-325 MG tablet        Allergies  Allergen Reactions  . Cephalosporins Diarrhea and Nausea Only    Lightheaded  . Levaquin [Levofloxacin In D5w] Swelling  . Macrodantin [Nitrofurantoin Macrocrystal] Swelling  . Phenothiazines Anaphylaxis and Hives  . Polysorbate Anaphylaxis  . Prednisone Shortness Of Breath  . Buspirone Itching  . Cardura [Doxazosin Mesylate] Itching  . Codeine Itching  . Acyclovir And Related   . Prochlorperazine Other (See Comments)    unknown  . Ranexa [Ranolazine]     Severe drop in BP  . Atorvastatin Hives    Cramping; tolerates Crestor ok  . Ofloxacin Rash  . Other Itching and Rash    "WOOL"= make skin look like it has been burned  . Penicillins Other (See Comments)    Causes redness all over. Has patient had a PCN reaction causing immediate rash, facial/tongue/throat swelling, SOB or lightheadedness with hypotension: No Has patient had a PCN reaction causing severe rash involving mucus membranes or skin necrosis: No Has patient had a PCN reaction that required hospitalization No Has patient had a PCN reaction occurring within the last 10 years: No If all of the above answers are "NO", then may proceed with Cephalosporin use.   . Pimozide Hives and Itching      The results of significant diagnostics from this hospitalization (including imaging, microbiology, ancillary and laboratory) are listed below for reference.    Significant Diagnostic Studies: Dg Chest 2 View  Result Date: 03/20/2016 CLINICAL DATA:  82 y/o F; dry cough  and shortness of breath with exertion for 1 week. EXAM: CHEST  2 VIEW COMPARISON:  02/16/2016 chest radiograph FINDINGS: Stable mildly enlarged cardiac silhouette given projection and technique. Aortic atherosclerosis with arch calcification. Increased interstitial markings from prior radiographs. No new consolidation or effusion. Status post cholecystectomy. Bones are unremarkable. IMPRESSION: Diminished interstitial markings from prior radiographs with residua pulmonary venous hypertension. Stable borderline cardiomegaly. Aortic atherosclerosis. No new focal consolidation or effusion. Electronically Signed   By: Kristine Garbe M.D.   On: 03/20/2016 14:34    Microbiology: No results found for this or any previous visit (from the past 240 hour(s)).   Labs: Basic Metabolic Panel:  Recent Labs Lab 03/20/16 1552 03/21/16 0601  NA 131* 133*  K 3.8 3.4*  CL 98* 96*  CO2 25 28  GLUCOSE 91 105*  BUN 16 15  CREATININE 1.03* 1.04*  CALCIUM 9.1 9.0   Liver Function Tests:  Recent Labs Lab 03/20/16 1552  AST 17  ALT 14  ALKPHOS 94  BILITOT 0.6  PROT 7.3  ALBUMIN 3.8   No results for input(s): LIPASE, AMYLASE in the last 168 hours. No results for input(s): AMMONIA in the last 168 hours. CBC:  Recent Labs Lab 03/20/16 1552 03/21/16 0601  WBC 7.1 6.6  NEUTROABS 4.1  --   HGB 9.1* 9.0*  HCT 28.4* 28.3*  MCV 80.0 79.7  PLT 364 353   Cardiac Enzymes:  Recent Labs Lab 03/20/16 1552  TROPONINI <0.03   BNP: BNP (last 3 results)  Recent Labs  12/15/15 1608 02/16/16 1627 03/20/16 1553  BNP 56.0 483.0* 338.0*    ProBNP (last 3 results) No results for input(s): PROBNP in the last 8760 hours.  CBG: No results for input(s): GLUCAP in the last 168 hours.     Signed:  Velvet Bathe MD.  [facility] 03/22/2016, 1:50 PM

## 2016-03-22 NOTE — Progress Notes (Signed)
Patient's oxygen saturation at rest on room air is 97% and with ambulation on room air is 91%. Patient is short of breath with ambulation.

## 2016-03-22 NOTE — Progress Notes (Signed)
Patient states understanding of discharge instructions.  

## 2016-03-23 ENCOUNTER — Other Ambulatory Visit: Payer: Self-pay | Admitting: Cardiovascular Disease

## 2016-03-24 ENCOUNTER — Other Ambulatory Visit: Payer: Self-pay | Admitting: *Deleted

## 2016-03-24 ENCOUNTER — Telehealth: Payer: Self-pay | Admitting: Cardiovascular Disease

## 2016-03-24 ENCOUNTER — Encounter: Payer: Self-pay | Admitting: *Deleted

## 2016-03-24 ENCOUNTER — Other Ambulatory Visit: Payer: Self-pay

## 2016-03-24 VITALS — BP 128/64 | HR 67 | Resp 18 | Ht 61.0 in | Wt 168.9 lb

## 2016-03-24 DIAGNOSIS — I509 Heart failure, unspecified: Secondary | ICD-10-CM

## 2016-03-24 MED ORDER — APIXABAN 5 MG PO TABS: 5.0000 mg | ORAL_TABLET | Freq: Two times a day (BID) | ORAL | 6 refills | Status: DC

## 2016-03-24 NOTE — Patient Outreach (Signed)
[facility]) Care Management   03/24/2016  [name] April 03, 1933 [id number]  [name] is an 82 y.o. female  Subjective: Routine home visit with pt, son present, HIPAA verified, pt reports she has been out of eliquis for 2 days and out of some other medications, pt saw "back surgeon yesterday" and will most likely be having more back surgery due to pain.  Pt states her son and daughter live with her and provide good support.  Pt has been weighing daily.  Objective:   Vitals:   03/24/16 1310  BP: 128/64  Pulse: 67  Resp: 18  SpO2: 94%  Weight: 168 lb 14.4 oz (76.6 kg)  Height: 1.549 m (5\' 1" )   ROS  Physical Exam  Encounter Medications:   Outpatient Encounter Prescriptions as of 03/24/2016  Medication Sig Note  . acetaminophen (TYLENOL) 500 MG tablet Take 500 mg by mouth daily as needed for headache.    . ALPRAZolam (XANAX) 0.25 MG tablet Take 0.25 mg by mouth 2 (two) times daily as needed for anxiety.    . carvedilol (COREG) 25 MG tablet Take 12.5 mg by mouth 2 (two) times daily with a meal.    . fluticasone (FLONASE) 50 MCG/ACT nasal spray Place 2 sprays into both nostrils daily.    . furosemide (LASIX) 20 MG tablet Take 1 tablet (20 mg total) by mouth daily.   [name] Kitchen gabapentin (NEURONTIN) 300 MG capsule Take 1 capsule (300 mg total) by mouth 3 (three) times daily.   . hydrocortisone (ANUSOL-HC) 2.5 % rectal cream Place 1 application rectally 2 (two) times daily.   [name] Kitchen ipratropium-albuterol (DUONEB) 0.5-2.5 (3) MG/3ML SOLN Take 3 mLs by nebulization every 6 (six) hours.   [name] Kitchen loratadine (CLARITIN) 10 MG tablet Take 10 mg by mouth every morning.   . Melatonin 3 MG CAPS Take 1 capsule by mouth at bedtime.   . nitroGLYCERIN (NITROSTAT) 0.4 MG SL tablet Place 0.4 mg under the tongue every 5 (five) minutes as needed for chest pain. Reported on 08/04/2015 02/21/2016: On hand  . Oxycodone HCl 10 MG TABS Take 5-10 mg by mouth every 4 (four) hours as needed. For pain  03/20/2016: Received from: External Pharmacy Received Sig: TAKE 1/2 TO 1 TABLET BY MOUTH EVERY 4 TO 6 HOURS AS NEEDED FOR PAIN.  [name] Kitchen pantoprazole (PROTONIX) 40 MG tablet Take 40 mg by mouth every morning.    . potassium chloride (K-DUR,KLOR-CON) 10 MEQ tablet TAKE TWO (2) TABLETS BY MOUTH DAILY.   [name] Kitchen apixaban (ELIQUIS) 5 MG TABS tablet Take 1 tablet (5 mg total) by mouth 2 (two) times daily. (Patient not taking: Reported on 03/24/2016)   . diltiazem (CARDIZEM CD) 240 MG 24 hr capsule Take 1 capsule (240 mg total) by mouth daily. (Patient not taking: Reported on 03/24/2016)   . DULoxetine (CYMBALTA) 60 MG capsule Take 60 mg by mouth at bedtime.     No facility-administered encounter medications on file as of 03/24/2016.     Functional Status:   In your present state of health, do you have any difficulty performing the following activities: 03/24/2016 03/20/2016  Hearing? N N  Vision? N N  Difficulty concentrating or making decisions? N N  Walking or climbing stairs? Y Y  Dressing or bathing? N N  Doing errands, shopping? N N  Preparing Food and eating ? N -  Using the Toilet? N -  In the past six months, have you accidently leaked urine? N -  Do you  have problems with loss of bowel control? Y -  Managing your Medications? N -  Managing your Finances? Y -  Housekeeping or managing your Housekeeping? Y -  Some recent data might be hidden    Fall/Depression Screening:    PHQ 2/9 Scores 03/24/2016  PHQ - 2 Score 1   Fall Risk  03/24/2016 03/24/2016  Falls in the past year? No No  Risk for fall due to : - Medication side effect    Assessment:  RN CM called [facility] at [facility] and reported pt is at home, per [facility], they thought pt was observation and not admission and will put pt on the list to be seen within next few days.  See care plan for assistance with eliquis, RN CM reiterated importance of not running out of medications.  RN CM reviewed safety at home, pt oversees her own  medications but could use assistance from pharmacy due to running out of medications and pt was not able to call and get refilled on her own.  [facility] CM Care Plan Problem One   Flowsheet Row Most Recent Value  Care Plan Problem One  Knowledge Deficits related to self health management of CHF  Role Documenting the Problem One  Care Management [place] for Problem One  Active  THN Long Term Goal (31-90 days)  Over the next 60 days, patient will verbalize understanding of plan of care for self health management of CHF  THN Long Term Goal Start Date  02/21/16  Interventions for Problem One Long Term Goal  RN CM reviewed importance of daily weights and correlation to CHF, reviewed importance of attending all scheduled MD appointments, gave pt [facility] calendar and ask pt to record weight in calendar, gave EMMI education and reviewed, gave CHF zones and reviewed, gave 24 hour nurse line magent  THN CM Short Term Goal #1 (0-30 days)  Pt will have all medications and take as prescribed over next 30 days.  THN CM Short Term Goal #1 Start Date  03/24/16  Interventions for Short Term Goal #1  RN CM observed medications and reviewed with pt, pt is out of eliquis x 2 days, is out of cymbalta and diltiazem, RN CM called Dr. Marthann Schiller office (cardiology) and left voicemail requesting samples, called [facility] spoke with Amy and she states they are waiting on authorization from insurance company and paperwork from MD, after several calls, [facility] did get authorization and will ship out to pt today and they have faxed MD on diltiazem and cymbalta and will get those shipped out as soon as possible, RN CM placed order for [facility] pharmacist due to pt having confusion with medications and running out of meds.  THN CM Short Term Goal #2 (0-30 days)  Over the next 30 days, patient will weigh daily, record, and call provider for weight gain of 3# overnight or 5# in a week  THN CM Short Term Goal #2 Start Date   02/21/16  Interventions for Short Term Goal #2  Reviewed importance of daily weights, pt is weighing daily, this goal will be ongoing over the next few weeks  THN CM Short Term Goal #3 (0-30 days)  Over the next 30 days, patient will take all medications as prescribed as evidenced by patient/family report of same  [facility] CM Short Term Goal #3 Start Date  02/21/16  Interventions for Short Tern Goal #3  Utilizing teachback method, reviewed medications with patient and discussed importance of  adherence to prescribed regimen      Plan: continue weekly transition of care calls See pt for home visit in January Assess weight Assess medications  Jacqlyn Larsen [facility], [place] Coordinator [phone]

## 2016-03-24 NOTE — Telephone Encounter (Signed)
Duplicate message. 

## 2016-03-24 NOTE — Telephone Encounter (Signed)
° °  1. Which medications need to be refilled? (please list name of each medication and dose if known)   apixaban (ELIQUIS) 5 MG TABS tablet [[id number]]   2. Which pharmacy/location (including street and city if local pharmacy) is medication to be sent to?  [facility]   3. Do they need a 30 day or 90 day supply?   30 day

## 2016-03-24 NOTE — Addendum Note (Signed)
Addended by: Julian Hy T on: 03/24/2016 11:09 AM   Modules accepted: Orders

## 2016-03-24 NOTE — Telephone Encounter (Signed)
Medication sent to pharmacy  

## 2016-03-24 NOTE — Patient Outreach (Signed)
I called Ms. Wolfrey to discussed if she was able to get her Eliquis prescription from her cardiologist.  She stated she was not able to get her prescription from the provider.  I called Dr. Court Joy office and asked them to send a new prescription to [place].  The nurse I spoke with stated she would send the message to the nurses in the [place] office.  I will follow up after 1 pm to see if the prescription was sent.  If not, I will call [facility] pharmacy to see if they will supply her with a few Eliquis to get her through the weekend.  I will follow up with her at the end of the day.   Deanne Coffer, PharmD, [employer] [phone]

## 2016-03-27 ENCOUNTER — Other Ambulatory Visit: Payer: Self-pay

## 2016-03-27 DIAGNOSIS — R32 Unspecified urinary incontinence: Secondary | ICD-10-CM | POA: Insufficient documentation

## 2016-03-27 NOTE — Patient Outreach (Signed)
03/27/16  Subjective [name] is an 82 year old female who was referred to pharmacy for medication assistance and adherence education. She was recently hospitalized for a COPD episode from 03/20/16 to 03/22/16.  There were no medication changes.  She was not sure if she was able to afford her Eliquis since she had not had a chance to fill it.  I had been working with her to get a refill for it.  She received on on Friday, March 24, 2016 and it was delivered to her house.  She stated it only cost $38 and she is able to afford it.  She has partial Extra Help which decreases the cost of her medications.  She was also out of her diltiazem and duloxetine per her nurse during her home visit.  Upon questioning, [name] stated she usually does not run out of her medications.  She typically takes it up to the pharmacy a few days prior to it running out so that the pharmacy has a few days to call the provider if it needs refills.  She does not know why the duloxetine has not been filled.  She has the diltiazem now.  She will call the pharmacy today.  She has no other pharmacy needs currently.   Objective Outpatient Encounter Prescriptions as of 03/27/2016  Medication Sig Note  . acetaminophen (TYLENOL) 500 MG tablet Take 500 mg by mouth daily as needed for headache.    . ALPRAZolam (XANAX) 0.25 MG tablet Take 0.25 mg by mouth 2 (two) times daily as needed for anxiety.    [name] Kitchen apixaban (ELIQUIS) 5 MG TABS tablet Take 1 tablet (5 mg total) by mouth 2 (two) times daily.   . carvedilol (COREG) 25 MG tablet Take 12.5 mg by mouth 2 (two) times daily with a meal.    . diltiazem (CARDIZEM CD) 240 MG 24 hr capsule Take 1 capsule (240 mg total) by mouth daily.   . DULoxetine (CYMBALTA) 60 MG capsule Take 60 mg by mouth at bedtime.    . fluticasone (FLONASE) 50 MCG/ACT nasal spray Place 2 sprays into both nostrils daily.    . furosemide (LASIX) 20 MG tablet Take 1 tablet (20 mg total) by mouth daily.   [name] Kitchen gabapentin  (NEURONTIN) 300 MG capsule Take 1 capsule (300 mg total) by mouth 3 (three) times daily.   . hydrocortisone (ANUSOL-HC) 2.5 % rectal cream Place 1 application rectally 2 (two) times daily.   [name] Kitchen ipratropium-albuterol (DUONEB) 0.5-2.5 (3) MG/3ML SOLN Take 3 mLs by nebulization every 6 (six) hours.   [name] Kitchen loratadine (CLARITIN) 10 MG tablet Take 10 mg by mouth every morning.   . Melatonin 3 MG CAPS Take 1 capsule by mouth at bedtime.   . nitroGLYCERIN (NITROSTAT) 0.4 MG SL tablet Place 0.4 mg under the tongue every 5 (five) minutes as needed for chest pain. Reported on 08/04/2015 02/21/2016: On hand  . Oxycodone HCl 10 MG TABS Take 5-10 mg by mouth every 4 (four) hours as needed. For pain 03/20/2016: Received from: External Pharmacy Received Sig: TAKE 1/2 TO 1 TABLET BY MOUTH EVERY 4 TO 6 HOURS AS NEEDED FOR PAIN.  [name] Kitchen pantoprazole (PROTONIX) 40 MG tablet Take 40 mg by mouth every morning.    . potassium chloride (K-DUR,KLOR-CON) 10 MEQ tablet TAKE TWO (2) TABLETS BY MOUTH DAILY.    No facility-administered encounter medications on file as of 03/27/2016.    Assessment  Patient was recently discharged from hospital and all medications have been reviewed.  Drugs sorted by system:  Neurologic/Psychologic: duloxetine, alprazolam, gabapentin, melatonin  Cardiovascular: carvedilol, diltiazem, furosemide, nitroglycerin SL, apixaban  Pulmonary/Allergy: ipratropium, albuterol, fluticasone NS, loratadine  Gastrointestinal: pantoprazole  Endocrine: none  Renal: none  Topical: hydrocortisone  Pain: acetaminophen, oxycodone  Vitamins/Minerals: Potassium  Infectious Diseases: none  Miscellaneous: none   Duplications in therapy: none  Gaps in therapy: none  Medications to avoid in the elderly: none  Drug interactions: none  Other issues noted: none  Plan: 1.  No major drug interactions or issues noted.  2.  She was able to get the Eliquis and Diltiazem delivered to her house.  She is not  sure why duloxetine was not delivered.  She will follow up with her pharmacy to determine why she did not receive it.   3.  She is otherwise adherent to her medications and takes them on a routine basis.   4.  All her pharmacy issues have been resolved.  I am going to close her to pharmacy.  I am happy to address pharmacy issues in the future if needed.   Deanne Coffer, PharmD, [employer] [phone]

## 2016-03-28 ENCOUNTER — Other Ambulatory Visit: Payer: Self-pay | Admitting: Neurosurgery

## 2016-03-28 DIAGNOSIS — M4316 Spondylolisthesis, lumbar region: Secondary | ICD-10-CM

## 2016-03-29 ENCOUNTER — Other Ambulatory Visit: Payer: Self-pay | Admitting: *Deleted

## 2016-03-29 NOTE — Patient Outreach (Signed)
Telephone call to pt for transition of care week 2, no answer to home telephone and no option to leave voicemail, called cell phone and no answer to phone, received message stating "call can't be completed as dialed"    PLAN Continue weekly transition of care calls  Jacqlyn Larsen [facility], [place] Coordinator [phone]

## 2016-04-03 DIAGNOSIS — N183 Chronic kidney disease, stage 3 (moderate): Secondary | ICD-10-CM | POA: Diagnosis not present

## 2016-04-03 DIAGNOSIS — I25119 Atherosclerotic heart disease of native coronary artery with unspecified angina pectoris: Secondary | ICD-10-CM | POA: Diagnosis not present

## 2016-04-03 DIAGNOSIS — I130 Hypertensive heart and chronic kidney disease with heart failure and stage 1 through stage 4 chronic kidney disease, or unspecified chronic kidney disease: Secondary | ICD-10-CM | POA: Diagnosis not present

## 2016-04-03 DIAGNOSIS — I5031 Acute diastolic (congestive) heart failure: Secondary | ICD-10-CM | POA: Diagnosis not present

## 2016-04-03 DIAGNOSIS — J441 Chronic obstructive pulmonary disease with (acute) exacerbation: Secondary | ICD-10-CM | POA: Diagnosis not present

## 2016-04-03 DIAGNOSIS — I480 Paroxysmal atrial fibrillation: Secondary | ICD-10-CM | POA: Diagnosis not present

## 2016-04-04 ENCOUNTER — Other Ambulatory Visit: Payer: Self-pay | Admitting: *Deleted

## 2016-04-04 NOTE — Patient Outreach (Signed)
Telephone call to pt for Transition of care week 3, spoke with pt, HIPAA verified, pt reports "I've had diarrhea for 3 days and home health nurse called dr yesterday and reported it"  Pt states she was instructed by MD to take immodium, pt is taking and states she has had 4 loose stools already this morning, pt is worried about becoming dehydrated and has had C-diff in the past, weight today is 162 pounds, pt is weighing daily, has all medications and reports taking as prescribed.  Pt states she is eating dry toast and drinking fluids, eating ice chips, will start eating yogurt today. RN CM called primary provider Tawni Carnes [facility], spoke with Anderson Malta, reported the above information regarding diarrhea that has lasted 3 days, Anderson Malta will let provider know and they will call pt back, pt verbalizes understanding that MD office will call her back,  RN CM ask pt to call for any questions, concerns.  [facility] CM Care Plan Problem One   Flowsheet Row Most Recent Value  Care Plan Problem One  Knowledge Deficits related to self health management of CHF  Role Documenting the Problem One  Care Management [place] for Problem One  Active  THN Long Term Goal (31-90 days)  Over the next 60 days, patient will verbalize understanding of plan of care for self health management of CHF  THN Long Term Goal Start Date  02/21/16  Interventions for Problem One Long Term Goal  RN CM reviewed importance of daily weights and correlation to CHF, reviewed importance of attending all scheduled MD appointments, gave pt [facility] calendar and ask pt to record weight in calendar, gave EMMI education and reviewed, gave CHF zones and reviewed, gave 24 hour nurse line magent  THN CM Short Term Goal #1 (0-30 days)  Pt will have all medications and take as prescribed over next 30 days.  THN CM Short Term Goal #1 Start Date  03/24/16  THN CM Short Term Goal #2 (0-30 days)  Over the next 30 days, patient will weigh daily, record, and  call provider for weight gain of 3# overnight or 5# in a week  THN CM Short Term Goal #2 Start Date  02/21/16  Interventions for Short Term Goal #2  Reviewed importance of daily weights, pt is weighing daily, this goal will be ongoing over the next few weeks  THN CM Short Term Goal #3 (0-30 days)  Over the next 30 days, patient will take all medications as prescribed as evidenced by patient/family report of same  [facility] CM Short Term Goal #3 Start Date  02/21/16  Interventions for Short Tern Goal #3  Utilizing teachback method, reinforced medications with patient and discussed importance of adherence to prescribed regimen      PLAN Continue weekly transition of care calls Assess weight, diarrhea  Jacqlyn Larsen [facility], [place] Coordinator [phone]

## 2016-04-07 DIAGNOSIS — R197 Diarrhea, unspecified: Secondary | ICD-10-CM | POA: Diagnosis not present

## 2016-04-11 ENCOUNTER — Ambulatory Visit
Admission: RE | Admit: 2016-04-11 | Discharge: 2016-04-11 | Disposition: A | Discharge status: Home / Self Care | Payer: Medicare Other | Source: Ambulatory Visit | Attending: Neurosurgery | Admitting: Neurosurgery

## 2016-04-11 ENCOUNTER — Other Ambulatory Visit: Payer: Self-pay | Admitting: *Deleted

## 2016-04-11 DIAGNOSIS — M4316 Spondylolisthesis, lumbar region: Secondary | ICD-10-CM

## 2016-04-11 DIAGNOSIS — M5126 Other intervertebral disc displacement, lumbar region: Secondary | ICD-10-CM | POA: Diagnosis not present

## 2016-04-11 MED ORDER — MEPERIDINE HCL 100 MG/ML IJ SOLN
75.0000 mg | Freq: Once | INTRAMUSCULAR | Status: AC
  Administered 2016-04-11: 75 mg via INTRAMUSCULAR

## 2016-04-11 MED ORDER — IOPAMIDOL (ISOVUE-M 200) INJECTION 41%
15.0000 mL | Freq: Once | INTRAMUSCULAR | Status: AC
Start: 2016-04-11 — End: 2016-04-11
  Administered 2016-04-11: 15 mL via INTRATHECAL

## 2016-04-11 MED ORDER — ONDANSETRON HCL 4 MG/2ML IJ SOLN
4.0000 mg | Freq: Once | INTRAMUSCULAR | Status: AC
  Administered 2016-04-11: 4 mg via INTRAMUSCULAR

## 2016-04-11 NOTE — Patient Outreach (Signed)
Telephone call to pt for transition of care week 4, no answer to home telephone and no option to leave voicemail, called cell phone and no answer, received voicemail stating " call can't be completed as dialed".  PLAN See pt for home visit in January  Jacqlyn Larsen [facility], [place] Coordinator [phone]

## 2016-04-11 NOTE — Discharge Instructions (Signed)
Myelogram Discharge Instructions  1. Go home and rest quietly for the next 24 hours.  It is important to lie flat for the next 24 hours.  Get up only to go to the restroom.  You may lie in the bed or on a couch on your back, your stomach, your left side or your right side.  You may have one pillow under your head.  You may have pillows between your knees while you are on your side or under your knees while you are on your back.  2. DO NOT drive today.  Recline the seat as far back as it will go, while still wearing your seat belt, on the way home.  3. You may get up to go to the bathroom as needed.  You may sit up for 10 minutes to eat.  You may resume your normal diet and medications unless otherwise indicated.  Drink lots of extra fluids today and tomorrow.  4. The incidence of headache, nausea, or vomiting is about 5% (one in 20 patients).  If you develop a headache, lie flat and drink plenty of fluids until the headache goes away.  Caffeinated beverages may be helpful.  If you develop severe nausea and vomiting or a headache that does not go away with flat bed rest, call [phone].  5. You may resume normal activities after your 24 hours of bed rest is over; however, do not exert yourself strongly or do any heavy lifting tomorrow. If when you get up you have a headache when standing, go back to bed and force fluids for another 24 hours.  6. Call your physician for a follow-up appointment.  The results of your myelogram will be sent directly to your physician by the following day.  7. If you have any questions or if complications develop after you arrive home, please call [phone].  Discharge instructions have been explained to the patient.  The patient, or the person responsible for the patient, fully understands these instructions.       MAY RESUME ELIQUIS TODAY.   May resume Cymbalta on Dec. 27, 2107, after 10:30 am.

## 2016-04-11 NOTE — Progress Notes (Signed)
Pt states she has been off Eliquis and Cymbalta for the past 2 days.

## 2016-04-15 ENCOUNTER — Inpatient Hospital Stay (HOSPITAL_COMMUNITY)
Admission: EM | Admit: 2016-04-15 | Discharge: 2016-04-18 | DRG: 291 | Disposition: A | Discharge status: Home Health | Payer: Medicare Other | Attending: Internal Medicine | Admitting: Internal Medicine

## 2016-04-15 ENCOUNTER — Encounter (HOSPITAL_COMMUNITY): Payer: Self-pay | Admitting: *Deleted

## 2016-04-15 ENCOUNTER — Emergency Department (HOSPITAL_COMMUNITY): Payer: Medicare Other

## 2016-04-15 DIAGNOSIS — Z87891 Personal history of nicotine dependence: Secondary | ICD-10-CM

## 2016-04-15 DIAGNOSIS — R001 Bradycardia, unspecified: Secondary | ICD-10-CM | POA: Diagnosis present

## 2016-04-15 DIAGNOSIS — I4891 Unspecified atrial fibrillation: Secondary | ICD-10-CM | POA: Diagnosis not present

## 2016-04-15 DIAGNOSIS — I509 Heart failure, unspecified: Secondary | ICD-10-CM | POA: Diagnosis not present

## 2016-04-15 DIAGNOSIS — Y92009 Unspecified place in unspecified non-institutional (private) residence as the place of occurrence of the external cause: Secondary | ICD-10-CM

## 2016-04-15 DIAGNOSIS — N183 Chronic kidney disease, stage 3 unspecified: Secondary | ICD-10-CM | POA: Diagnosis present

## 2016-04-15 DIAGNOSIS — E860 Dehydration: Secondary | ICD-10-CM | POA: Diagnosis present

## 2016-04-15 DIAGNOSIS — R0602 Shortness of breath: Secondary | ICD-10-CM | POA: Diagnosis not present

## 2016-04-15 DIAGNOSIS — J069 Acute upper respiratory infection, unspecified: Secondary | ICD-10-CM | POA: Diagnosis present

## 2016-04-15 DIAGNOSIS — Z823 Family history of stroke: Secondary | ICD-10-CM

## 2016-04-15 DIAGNOSIS — Z7901 Long term (current) use of anticoagulants: Secondary | ICD-10-CM | POA: Diagnosis not present

## 2016-04-15 DIAGNOSIS — E785 Hyperlipidemia, unspecified: Secondary | ICD-10-CM | POA: Diagnosis present

## 2016-04-15 DIAGNOSIS — T461X6A Underdosing of calcium-channel blockers, initial encounter: Secondary | ICD-10-CM | POA: Diagnosis present

## 2016-04-15 DIAGNOSIS — Z955 Presence of coronary angioplasty implant and graft: Secondary | ICD-10-CM | POA: Diagnosis not present

## 2016-04-15 DIAGNOSIS — M6281 Muscle weakness (generalized): Secondary | ICD-10-CM

## 2016-04-15 DIAGNOSIS — K219 Gastro-esophageal reflux disease without esophagitis: Secondary | ICD-10-CM | POA: Diagnosis present

## 2016-04-15 DIAGNOSIS — I130 Hypertensive heart and chronic kidney disease with heart failure and stage 1 through stage 4 chronic kidney disease, or unspecified chronic kidney disease: Principal | ICD-10-CM | POA: Diagnosis present

## 2016-04-15 DIAGNOSIS — Z8249 Family history of ischemic heart disease and other diseases of the circulatory system: Secondary | ICD-10-CM

## 2016-04-15 DIAGNOSIS — N179 Acute kidney failure, unspecified: Secondary | ICD-10-CM | POA: Diagnosis present

## 2016-04-15 DIAGNOSIS — I480 Paroxysmal atrial fibrillation: Secondary | ICD-10-CM | POA: Diagnosis not present

## 2016-04-15 DIAGNOSIS — E875 Hyperkalemia: Secondary | ICD-10-CM | POA: Diagnosis not present

## 2016-04-15 DIAGNOSIS — J9601 Acute respiratory failure with hypoxia: Secondary | ICD-10-CM | POA: Diagnosis present

## 2016-04-15 DIAGNOSIS — I5033 Acute on chronic diastolic (congestive) heart failure: Secondary | ICD-10-CM | POA: Diagnosis present

## 2016-04-15 DIAGNOSIS — R05 Cough: Secondary | ICD-10-CM | POA: Diagnosis not present

## 2016-04-15 DIAGNOSIS — Z833 Family history of diabetes mellitus: Secondary | ICD-10-CM

## 2016-04-15 DIAGNOSIS — Z79899 Other long term (current) drug therapy: Secondary | ICD-10-CM

## 2016-04-15 DIAGNOSIS — Z96651 Presence of right artificial knee joint: Secondary | ICD-10-CM | POA: Diagnosis present

## 2016-04-15 DIAGNOSIS — Z91128 Patient's intentional underdosing of medication regimen for other reason: Secondary | ICD-10-CM | POA: Diagnosis not present

## 2016-04-15 DIAGNOSIS — T502X6A Underdosing of carbonic-anhydrase inhibitors, benzothiadiazides and other diuretics, initial encounter: Secondary | ICD-10-CM | POA: Diagnosis present

## 2016-04-15 DIAGNOSIS — Z820 Family history of epilepsy and other diseases of the nervous system: Secondary | ICD-10-CM

## 2016-04-15 DIAGNOSIS — J441 Chronic obstructive pulmonary disease with (acute) exacerbation: Secondary | ICD-10-CM | POA: Diagnosis present

## 2016-04-15 DIAGNOSIS — I252 Old myocardial infarction: Secondary | ICD-10-CM | POA: Diagnosis not present

## 2016-04-15 DIAGNOSIS — J449 Chronic obstructive pulmonary disease, unspecified: Secondary | ICD-10-CM | POA: Diagnosis present

## 2016-04-15 DIAGNOSIS — I739 Peripheral vascular disease, unspecified: Secondary | ICD-10-CM | POA: Diagnosis present

## 2016-04-15 DIAGNOSIS — I482 Chronic atrial fibrillation: Secondary | ICD-10-CM | POA: Diagnosis present

## 2016-04-15 DIAGNOSIS — I2510 Atherosclerotic heart disease of native coronary artery without angina pectoris: Secondary | ICD-10-CM | POA: Diagnosis present

## 2016-04-15 DIAGNOSIS — R262 Difficulty in walking, not elsewhere classified: Secondary | ICD-10-CM

## 2016-04-15 DIAGNOSIS — I10 Essential (primary) hypertension: Secondary | ICD-10-CM

## 2016-04-15 LAB — COMPREHENSIVE METABOLIC PANEL
ALBUMIN: 3.6 g/dL (ref 3.5–5.0)
ALT: 15 U/L (ref 14–54)
ANION GAP: 9 (ref 5–15)
AST: 21 U/L (ref 15–41)
Alkaline Phosphatase: 118 U/L (ref 38–126)
BUN: 13 mg/dL (ref 6–20)
CHLORIDE: 97 mmol/L — AB (ref 101–111)
CO2: 30 mmol/L (ref 22–32)
Calcium: 9.1 mg/dL (ref 8.9–10.3)
Creatinine, Ser: 1.13 mg/dL — ABNORMAL HIGH (ref 0.44–1.00)
GFR calc non Af Amer: 44 mL/min — ABNORMAL LOW (ref >60)
GFR, EST AFRICAN AMERICAN: 51 mL/min — AB (ref >60)
Glucose, Bld: 125 mg/dL — ABNORMAL HIGH (ref 65–99)
Potassium: 3.6 mmol/L (ref 3.5–5.1)
SODIUM: 136 mmol/L (ref 135–145)
Total Bilirubin: 1 mg/dL (ref 0.3–1.2)
Total Protein: 7.8 g/dL (ref 6.5–8.1)

## 2016-04-15 LAB — CBC WITH DIFFERENTIAL/PLATELET
BASOS PCT: 0 %
Basophils Absolute: 0 10*3/uL (ref 0.0–0.1)
EOS ABS: 0.1 10*3/uL (ref 0.0–0.7)
EOS PCT: 1 %
HCT: 31.6 % — ABNORMAL LOW (ref 36.0–46.0)
Hemoglobin: 10.2 g/dL — ABNORMAL LOW (ref 12.0–15.0)
LYMPHS ABS: 1.4 10*3/uL (ref 0.7–4.0)
Lymphocytes Relative: 13 %
MCH: 25.6 pg — AB (ref 26.0–34.0)
MCHC: 32.3 g/dL (ref 30.0–36.0)
MCV: 79.4 fL (ref 78.0–100.0)
Monocytes Absolute: 0.9 10*3/uL (ref 0.1–1.0)
Monocytes Relative: 9 %
Neutro Abs: 8.4 10*3/uL — ABNORMAL HIGH (ref 1.7–7.7)
Neutrophils Relative %: 77 %
PLATELETS: 383 10*3/uL (ref 150–400)
RBC: 3.98 MIL/uL (ref 3.87–5.11)
RDW: 15.8 % — ABNORMAL HIGH (ref 11.5–15.5)
WBC: 10.9 10*3/uL — AB (ref 4.0–10.5)

## 2016-04-15 LAB — I-STAT TROPONIN, ED: Troponin i, poc: 0 ng/mL (ref 0.00–0.08)

## 2016-04-15 LAB — BRAIN NATRIURETIC PEPTIDE: B NATRIURETIC PEPTIDE 5: 458 pg/mL — AB (ref 0.0–100.0)

## 2016-04-15 LAB — MRSA PCR SCREENING: MRSA BY PCR: NEGATIVE

## 2016-04-15 MED ORDER — CARVEDILOL 12.5 MG PO TABS
12.5000 mg | ORAL_TABLET | Freq: Two times a day (BID) | ORAL | Status: DC
  Administered 2016-04-15 – 2016-04-16 (×2): 12.5 mg via ORAL
  Filled 2016-04-15 (×2): qty 1

## 2016-04-15 MED ORDER — DILTIAZEM HCL 25 MG/5ML IV SOLN
10.0000 mg | Freq: Once | INTRAVENOUS | Status: AC
  Administered 2016-04-15: 10 mg via INTRAVENOUS
  Filled 2016-04-15: qty 5

## 2016-04-15 MED ORDER — IPRATROPIUM-ALBUTEROL 0.5-2.5 (3) MG/3ML IN SOLN
3.0000 mL | Freq: Once | RESPIRATORY_TRACT | Status: AC
  Administered 2016-04-15: 3 mL via RESPIRATORY_TRACT
  Filled 2016-04-15: qty 3

## 2016-04-15 MED ORDER — DILTIAZEM HCL 100 MG IV SOLR
5.0000 mg/h | Freq: Once | INTRAVENOUS | Status: AC
  Administered 2016-04-15: 5 mg/h via INTRAVENOUS
  Filled 2016-04-15: qty 100

## 2016-04-15 MED ORDER — METHYLPREDNISOLONE SODIUM SUCC 125 MG IJ SOLR
125.0000 mg | Freq: Once | INTRAMUSCULAR | Status: AC
  Administered 2016-04-15: 125 mg via INTRAVENOUS
  Filled 2016-04-15: qty 2

## 2016-04-15 MED ORDER — GABAPENTIN 300 MG PO CAPS
300.0000 mg | ORAL_CAPSULE | Freq: Three times a day (TID) | ORAL | Status: DC
  Administered 2016-04-15 – 2016-04-18 (×8): 300 mg via ORAL
  Filled 2016-04-15 (×8): qty 1

## 2016-04-15 MED ORDER — SODIUM CHLORIDE 0.9% FLUSH
3.0000 mL | Freq: Two times a day (BID) | INTRAVENOUS | Status: DC
  Administered 2016-04-16 – 2016-04-17 (×3): 3 mL via INTRAVENOUS

## 2016-04-15 MED ORDER — ALPRAZOLAM 0.25 MG PO TABS
0.2500 mg | ORAL_TABLET | Freq: Two times a day (BID) | ORAL | Status: DC | PRN
  Administered 2016-04-16: 0.25 mg via ORAL
  Filled 2016-04-15: qty 1

## 2016-04-15 MED ORDER — POTASSIUM CHLORIDE CRYS ER 20 MEQ PO TBCR
40.0000 meq | EXTENDED_RELEASE_TABLET | Freq: Two times a day (BID) | ORAL | Status: DC
  Administered 2016-04-15 – 2016-04-16 (×3): 40 meq via ORAL
  Filled 2016-04-15 (×3): qty 2

## 2016-04-15 MED ORDER — ALBUTEROL SULFATE (2.5 MG/3ML) 0.083% IN NEBU
2.5000 mg | INHALATION_SOLUTION | Freq: Once | RESPIRATORY_TRACT | Status: AC
  Administered 2016-04-15: 2.5 mg via RESPIRATORY_TRACT
  Filled 2016-04-15: qty 3

## 2016-04-15 MED ORDER — FUROSEMIDE 10 MG/ML IJ SOLN
20.0000 mg | Freq: Once | INTRAMUSCULAR | Status: AC
  Administered 2016-04-15: 20 mg via INTRAVENOUS
  Filled 2016-04-15: qty 2

## 2016-04-15 MED ORDER — OXYCODONE HCL 10 MG PO TABS: 5.0000 mg | ORAL_TABLET | ORAL | Status: DC | PRN

## 2016-04-15 MED ORDER — IPRATROPIUM-ALBUTEROL 0.5-2.5 (3) MG/3ML IN SOLN: 3.0000 mL | Freq: Four times a day (QID) | RESPIRATORY_TRACT | Status: DC

## 2016-04-15 MED ORDER — DILTIAZEM HCL 100 MG IV SOLR
5.0000 mg/h | INTRAVENOUS | Status: DC
  Administered 2016-04-15 – 2016-04-16 (×3): 5 mg/h via INTRAVENOUS
  Filled 2016-04-15: qty 100

## 2016-04-15 MED ORDER — FUROSEMIDE 10 MG/ML IJ SOLN
40.0000 mg | Freq: Two times a day (BID) | INTRAMUSCULAR | Status: DC
  Administered 2016-04-15 – 2016-04-16 (×3): 40 mg via INTRAVENOUS
  Filled 2016-04-15 (×3): qty 4

## 2016-04-15 MED ORDER — PANTOPRAZOLE SODIUM 40 MG PO TBEC
40.0000 mg | DELAYED_RELEASE_TABLET | Freq: Every morning | ORAL | Status: DC
  Administered 2016-04-16 – 2016-04-18 (×3): 40 mg via ORAL
  Filled 2016-04-15 (×3): qty 1

## 2016-04-15 MED ORDER — IPRATROPIUM-ALBUTEROL 0.5-2.5 (3) MG/3ML IN SOLN
3.0000 mL | Freq: Four times a day (QID) | RESPIRATORY_TRACT | Status: DC
  Administered 2016-04-15: 3 mL via RESPIRATORY_TRACT
  Filled 2016-04-15: qty 3

## 2016-04-15 MED ORDER — APIXABAN 5 MG PO TABS
5.0000 mg | ORAL_TABLET | Freq: Two times a day (BID) | ORAL | Status: DC
  Administered 2016-04-15 – 2016-04-17 (×5): 5 mg via ORAL
  Filled 2016-04-15 (×5): qty 1

## 2016-04-15 MED ORDER — DULOXETINE HCL 60 MG PO CPEP
60.0000 mg | ORAL_CAPSULE | Freq: Every day | ORAL | Status: DC
  Administered 2016-04-15 – 2016-04-17 (×3): 60 mg via ORAL
  Filled 2016-04-15 (×3): qty 1

## 2016-04-15 MED ORDER — OXYCODONE HCL 5 MG PO TABS
5.0000 mg | ORAL_TABLET | ORAL | Status: DC | PRN
  Administered 2016-04-16: 5 mg via ORAL
  Filled 2016-04-15: qty 1

## 2016-04-15 MED ORDER — SODIUM CHLORIDE 0.9% FLUSH: 3.0000 mL | INTRAVENOUS | Status: DC | PRN

## 2016-04-15 MED ORDER — ONDANSETRON HCL 4 MG/2ML IJ SOLN: 4.0000 mg | Freq: Four times a day (QID) | INTRAMUSCULAR | Status: DC | PRN

## 2016-04-15 MED ORDER — SODIUM CHLORIDE 0.9 % IV SOLN: 250.0000 mL | INTRAVENOUS | Status: DC | PRN

## 2016-04-15 MED ORDER — FLUTICASONE PROPIONATE 50 MCG/ACT NA SUSP
2.0000 | Freq: Every day | NASAL | Status: DC
  Administered 2016-04-16 – 2016-04-17 (×2): 2 via NASAL
  Filled 2016-04-15: qty 16

## 2016-04-15 MED ORDER — METHYLPREDNISOLONE SODIUM SUCC 125 MG IJ SOLR
60.0000 mg | Freq: Two times a day (BID) | INTRAMUSCULAR | Status: DC
  Administered 2016-04-15 – 2016-04-18 (×6): 60 mg via INTRAVENOUS
  Filled 2016-04-15 (×7): qty 2

## 2016-04-15 MED ORDER — ACETAMINOPHEN 325 MG PO TABS
650.0000 mg | ORAL_TABLET | ORAL | Status: DC | PRN
  Administered 2016-04-16: 650 mg via ORAL
  Filled 2016-04-15: qty 2

## 2016-04-15 NOTE — ED Triage Notes (Signed)
Pt here with URI which began Wednesday.  Pt has been having coughing with this and has felt like her heart was racing and has been sob.  Pt states that she has not been able to ambulate due to sob and he heart racing.

## 2016-04-15 NOTE — H&P (Signed)
History and Physical  [name] JQB:[id number] DOB: 20-Aug-1932 DOA: 04/15/2016  Referring physician: Dr Dewayne Hatch, ED physician PCP: [name]  Outpatient Specialists:   Dr Gala Romney (GI)  Patient Coming from Home  Chief Complaint: SOB  HPI: [name] is a 82 y.o. female with a history of PAF CHADs2 VASC of 5, hypertension, hyperlipidemia, coronary artery disease status post stent, CHF with LVEF of 55% on echo 12/17/15, COPD, GERD. Patient presents with 3 days of worsening shortness of breath, worse with ambulation and stairs and improved with rest. She also complains of nausea and has been unable to take her medications for the past 3 days, including her diuretics and her diltiazem. She denies vomiting. No fevers, chills, abdominal pain.  Emergency Department Course: Patient found to be in rapid ventricular rate, which improved with 2 doses of IV Cardizem, however rebounded back into RVR. Patient started on drip. Patient states that she is not having any nausea now. She was also found to be in mild heart failure with a slightly elevated proBNP and x-ray suggestive of mild failure.  Review of Systems:    Pt denies any fevers, chills, vomiting, diarrhea, constipation, abdominal pain, wheezing, palpitations, headache, vision changes, lightheadedness, dizziness, melena, rectal bleeding.  Review of systems are otherwise negative  Past Medical History:  Diagnosis Date  . Anxiety   . Arthritis   . Bursitis    left shoulder  . Cataract   . CHF (congestive heart failure) (HCC)    EF 55-60%  . Complication of anesthesia   . COPD (chronic obstructive pulmonary disease) ([place])   . Coronary atherosclerosis of native coronary artery    a. DES to CX, moderately severe stenosis RCA, mild stenosis LAD 04/2013  . Depression   . Dysphagia, unspecified(787.20)   . GERD (gastroesophageal reflux disease)    Hx Schatzki's ring, multiple EGD/ED last 01/06/2004  . Headache   . Heart  disease   . Heart murmur    'a littel'  . HTN (hypertension)    Hx of it  . Hyperlipemia   . Hyperlipidemia   . Internal hemorrhoids without mention of complication   . MI (myocardial infarction) 2006  . Microscopic colitis 2003  . Other and unspecified hyperlipidemia   . Panic disorder without agoraphobia   . Paresthesia    hands, feet  . Pneumonia 12/2011  . PONV (postoperative nausea and vomiting)    'a little nausea"  . PVD (peripheral vascular disease) ([place])   . S/P colonoscopy 09/27/2001   internal hemorrhoids, desc colon inflam polyp, SB BX-chronic duodenitis, colitis  . Shortness of breath   . Sleep disorder    obstructive  . Thyroid disease    recent abnl TSH per pt   Past Surgical History:  Procedure Laterality Date  . ABDOMINAL HYSTERECTOMY    . ANTERIOR AND POSTERIOR REPAIR     with resection of vagina  . APPENDECTOMY    . BACK SURGERY    . BIOPSY  07/05/2015   Procedure: BIOPSY;  Surgeon: Daneil Dolin, MD;  [facility]: [facility];  Service: Endoscopy;;  gastric polyp biopsy, ascending colon biopsy  . BLADDER SUSPENSION  11/09/2011   Procedure: TRANSVAGINAL TAPE (TVT) PROCEDURE;  Surgeon: Marissa Nestle, MD;  Location: [facility] ORS;  Service: Urology;  Laterality: N/A;  . bladder tack  06/2010  . BREAST LUMPECTOMY  1998   left, benign  . CARDIAC CATHETERIZATION    . CARDIAC CATHETERIZATION    . CARDIAC  CATHETERIZATION N/A 12/16/2015   Procedure: Left Heart Cath and Coronary Angiography;  Surgeon: Troy Sine, MD;  Location: [place] CV LAB;  Service: Cardiovascular;  Laterality: N/A;  . [place]   left  . CHOLECYSTECTOMY  1998  . COLONOSCOPY  03/16/2011   multiple hyperplastic colon polyps, sigmoid diverticulosis, melanosis coli  . COLONOSCOPY WITH PROPOFOL N/A 07/05/2015   RMR:one 5 mm polyp in descending colon  . CORONARY ANGIOPLASTY WITH STENT PLACEMENT    . ESOPHAGEAL DILATION N/A 07/05/2015   Procedure: ESOPHAGEAL DILATION;   Surgeon: Daneil Dolin, MD;  Location: [facility];  Service: Endoscopy;  Laterality: N/A;  . ESOPHAGOGASTRODUODENOSCOPY (EGD) WITH PROPOFOL N/A 07/05/2015   XTG:GYIRSW  . JOINT REPLACEMENT Right 2007  . left hand surgery    . LEFT HEART CATHETERIZATION WITH CORONARY ANGIOGRAM N/A 05/14/2013   Procedure: LEFT HEART CATHETERIZATION WITH CORONARY ANGIOGRAM;  Surgeon: Blane Ohara, MD;  Location[facility] CATH LAB;  Service: Cardiovascular;  Laterality: N/A;  . left rotator cuff surgery    . LUMBAR LAMINECTOMY/DECOMPRESSION MICRODISCECTOMY N/A 10/11/2012   Procedure: LUMBAR LAMINECTOMY/DECOMPRESSION MICRODISCECTOMY 2 LEVELS;  Surgeon: Floyce Stakes, MD;  Location: [place] NEURO ORS;  Service: Neurosurgery;  Laterality: N/A;  L3-4 L4-5 Laminectomy  . LUMBAR WOUND DEBRIDEMENT N/A 09/27/2015   Procedure: Exploration of Lumbar Wound w/ Repair CSF Leak/Lumbar Drain Placement;  Surgeon: Leeroy Cha, MD;  Location: [place] NEURO ORS;  Service: Neurosurgery;  Laterality: N/A;  . MALONEY DILATION  03/16/2011   Gastritis. No H.pylori on bx. 42F maloney dilation with disruption of  occult cevical esophageal web  . NASAL SINUS SURGERY    . right knee replacement  2007  . right leg benign tumor    . SHOULDER SURGERY Left   . TONSILLECTOMY    . unspecified area, hysterectomy  1972   partial   Social History:  reports that she quit smoking about 14 years ago. Her smoking use included Cigarettes. She started smoking about 68 years ago. She has a 64.00 pack-year smoking history. She has never used smokeless tobacco. She reports that she does not drink alcohol or use drugs. Patient lives at [facility]  . Cephalosporins Diarrhea and Nausea Only    Lightheaded  . Levaquin [Levofloxacin In D5w] Swelling  . Macrodantin [Nitrofurantoin Macrocrystal] Swelling  . Phenothiazines Anaphylaxis and Hives  . Polysorbate Anaphylaxis  . Prednisone Shortness Of Breath  . Buspirone Itching  . Cardura  [Doxazosin Mesylate] Itching  . Codeine Itching  . Acyclovir And Related   . Prochlorperazine Other (See Comments)    unknown  . Ranexa [Ranolazine]     Severe drop in BP  . Atorvastatin Hives    Cramping; tolerates Crestor ok  . Ofloxacin Rash  . Other Itching and Rash    "WOOL"= make skin look like it has been burned  . Penicillins Other (See Comments)    Causes redness all over. Has patient had a PCN reaction causing immediate rash, facial/tongue/throat swelling, SOB or lightheadedness with hypotension: No Has patient had a PCN reaction causing severe rash involving mucus membranes or skin necrosis: No Has patient had a PCN reaction that required hospitalization No Has patient had a PCN reaction occurring within the last 10 years: No If all of the above answers are "NO", then may proceed with Cephalosporin use.   . Pimozide Hives and Itching    Family History  Problem Relation Age of Onset  . Stroke  Mother   . Parkinson's disease Father   . Coronary artery disease Other     family Hx-sons  . Cancer Other   . Stroke Other     family Hx  . Hypertension Other     family Hx  . Diabetes Brother   . Heart disease Son     before age 51  . Diabetes Son   . Stroke Daughter 50      Prior to Admission medications   Medication Sig Start Date End Date Taking? Authorizing Provider  ALPRAZolam (XANAX) 0.25 MG tablet Take 0.25 mg by mouth 2 (two) times daily as needed for anxiety.    Yes Historical Provider, MD  apixaban (ELIQUIS) 5 MG TABS tablet Take 1 tablet (5 mg total) by mouth 2 (two) times daily. 03/24/16  Yes Herminio Commons, MD  fluticasone (FLONASE) 50 MCG/ACT nasal spray Place 2 sprays into both nostrils daily.    Yes Historical Provider, MD  ipratropium-albuterol (DUONEB) 0.5-2.5 (3) MG/3ML SOLN Take 3 mLs by nebulization every 6 (six) hours. 12/18/15  Yes Belkys A Regalado, MD  loratadine (CLARITIN) 10 MG tablet Take 10 mg by mouth every morning.   Yes Historical  Provider, MD  nitroGLYCERIN (NITROSTAT) 0.4 MG SL tablet Place 0.4 mg under the tongue every 5 (five) minutes as needed for chest pain. Reported on 08/04/2015   Yes Historical Provider, MD  acetaminophen (TYLENOL) 500 MG tablet Take 500 mg by mouth daily as needed for headache.     Historical Provider, MD  carvedilol (COREG) 25 MG tablet Take 12.5 mg by mouth 2 (two) times daily with a meal.     Historical Provider, MD  diltiazem (CARDIZEM CD) 240 MG 24 hr capsule Take 1 capsule (240 mg total) by mouth daily. 02/21/16   Kathie Dike, MD  DULoxetine (CYMBALTA) 60 MG capsule Take 60 mg by mouth at bedtime.     Historical Provider, MD  furosemide (LASIX) 20 MG tablet Take 1 tablet (20 mg total) by mouth daily. 05/25/15   Lendon Colonel, NP  gabapentin (NEURONTIN) 300 MG capsule Take 1 capsule (300 mg total) by mouth 3 (three) times daily. 10/08/15   Kevan Ny Ditty, MD  hydrocortisone (ANUSOL-HC) 2.5 % rectal cream Place 1 application rectally 2 (two) times daily. 03/01/16   Annitta Needs, NP  Melatonin 3 MG CAPS Take 1 capsule by mouth at bedtime.    Historical Provider, MD  Oxycodone HCl 10 MG TABS Take 5-10 mg by mouth every 4 (four) hours as needed. For pain 03/13/16   Historical Provider, MD  pantoprazole (PROTONIX) 40 MG tablet Take 40 mg by mouth every morning.     Historical Provider, MD  potassium chloride (K-DUR,KLOR-CON) 10 MEQ tablet TAKE TWO (2) TABLETS BY MOUTH DAILY. 03/24/16   Herminio Commons, MD    Physical Exam: BP 123/78 (BP Location: Left Arm)   Pulse 120   Temp 98.5 F (36.9 C) (Oral)   Resp 18   Wt 71.7 kg (158 lb)   SpO2 95%   BMI 29.85 kg/m   General: Elderly Caucasian female. Awake and alert and oriented x3. No acute cardiopulmonary distress.  HEENT: Normocephalic atraumatic.  Right and left ears normal in appearance.  Pupils equal, round, reactive to light. Extraocular muscles are intact. Sclerae anicteric and noninjected.  Moist mucosal membranes. No mucosal  lesions.  Neck: Neck supple without lymphadenopathy. No carotid bruits. No masses palpated.  Cardiovascular: Regular rate with normal S1-S2 sounds. No murmurs,  rubs, gallops auscultated. No JVD.  Respiratory: Breath sounds. Wheezing throughout. Rales in the bases bilaterally. No accessory muscle use Abdomen: Soft, nontender, nondistended. Active bowel sounds. No masses or hepatosplenomegaly  Skin: No rashes, lesions, or ulcerations.  Dry, warm to touch. 2+ dorsalis pedis and radial pulses. Musculoskeletal: No calf or leg pain. All major joints not erythematous nontender.  No upper or lower joint deformation.  Good ROM.  No contractures  Psychiatric: Intact judgment and insight. Pleasant and cooperative. Neurologic: No focal neurological deficits. Strength is 5/5 and symmetric in upper and lower extremities.  Cranial nerves II through XII are grossly intact.           Labs on Admission: I have personally reviewed following labs and imaging studies  CBC:  Recent Labs Lab 04/15/16 1423  WBC 10.9*  NEUTROABS 8.4*  HGB 10.2*  HCT 31.6*  MCV 79.4  PLT 932   Basic Metabolic Panel:  Recent Labs Lab 04/15/16 1423  NA 136  K 3.6  CL 97*  CO2 30  GLUCOSE 125*  BUN 13  CREATININE 1.13*  CALCIUM 9.1   GFR: Estimated Creatinine Clearance: 35.4 mL/min (by C-G formula based on SCr of 1.13 mg/dL (H)). Liver Function Tests:  Recent Labs Lab 04/15/16 1423  AST 21  ALT 15  ALKPHOS 118  BILITOT 1.0  PROT 7.8  ALBUMIN 3.6   No results for input(s): LIPASE, AMYLASE in the last 168 hours. No results for input(s): AMMONIA in the last 168 hours. Coagulation Profile: No results for input(s): INR, PROTIME in the last 168 hours. Cardiac Enzymes: No results for input(s): CKTOTAL, CKMB, CKMBINDEX, TROPONINI in the last 168 hours. BNP (last 3 results) No results for input(s): PROBNP in the last 8760 hours. HbA1C: No results for input(s): HGBA1C in the last 72 hours. CBG: No results  for input(s): GLUCAP in the last 168 hours. Lipid Profile: No results for input(s): CHOL, HDL, LDLCALC, TRIG, CHOLHDL, LDLDIRECT in the last 72 hours. Thyroid Function Tests: No results for input(s): TSH, T4TOTAL, FREET4, T3FREE, THYROIDAB in the last 72 hours. Anemia Panel: No results for input(s): VITAMINB12, FOLATE, FERRITIN, TIBC, IRON, RETICCTPCT in the last 72 hours. Urine analysis:    Component Value Date/Time   COLORURINE YELLOW 10/27/2015 1345   APPEARANCEUR HAZY (A) 10/27/2015 1345   LABSPEC 1.010 10/27/2015 1345   PHURINE 5.5 10/27/2015 1345   GLUCOSEU NEGATIVE 10/27/2015 1345   HGBUR TRACE (A) 10/27/2015 1345   BILIRUBINUR NEGATIVE 10/27/2015 1345   KETONESUR NEGATIVE 10/27/2015 1345   PROTEINUR NEGATIVE 10/27/2015 1345   UROBILINOGEN 0.2 02/21/2015 2028   NITRITE NEGATIVE 10/27/2015 1345   LEUKOCYTESUR MODERATE (A) 10/27/2015 1345   Sepsis Labs: @LABRCNTIP (procalcitonin:4,lacticidven:4) )No results found for this or any previous visit (from the past 240 hour(s)).   Radiological Exams on Admission: Dg Chest Portable 1 View  Result Date: 04/15/2016 CLINICAL DATA:  SOB, Pt here with URI which began Wednesday. Pt has been having coughing with this and has felt like her heart was racing and has been sob. Pt states that she has not been able to ambulate due to sob and he heart racingHISTORY OF CHF, MI, HTN, COPD, PNEUMONIA EXAM: PORTABLE CHEST 1 VIEW COMPARISON:  03/20/2016 FINDINGS: Cardiac silhouette is mildly enlarged. No convincing mediastinal or hilar masses. There are irregularly thickened interstitial markings bilaterally mildly increased when compared to a chest radiograph dated 10/15/2015, but similar to the most recent prior exam. Findings may all be chronic. A mild degree of interstitial edema  is suspected given the history of shortness of breath. No lung consolidation is seen to suggest pneumonia. No convincing pleural effusion. No pneumothorax. Skeletal structures  are demineralized. There are old healed left rib fractures. IMPRESSION: 1. Cardiomegaly with interstitial thickening. Suspect mild congestive heart failure superimposed on chronic interstitial changes. No evidence of pneumonia. Electronically Signed   By: Lajean Manes M.D.   On: 04/15/2016 15:19    EKG: Independently reviewed. Atrial fibrillation with RVR.  Assessment/Plan: Active Problems:   Chronic kidney disease, stage III (moderate)   Essential hypertension   COPD exacerbation (HCC)   Acute on chronic diastolic congestive heart failure (HCC)   Atrial fibrillation with RVR ([place])    This patient was discussed with the ED physician, including pertinent vitals, physical exam findings, labs, and imaging.  We also discussed care given by the ED provider.  #1 atrial fib ablation with RVR  Admit to stepdown  Continue Cardizem drip  We'll convert to orals when meets criteria  Likely secondary to inability to take medications #2 acute on chronic diastolic heart failure Telemetry monitoring Strict I/O Daily Weights Diuresis: Lasix 40 mg IV twice a day Potassium: 40 mEq twice a day by mouth Echo cardiac exam tomorrow Repeat BMP tomorrow #3 COPD exacerbation Antibiotics: Not needed as no purulence sputum DuoNeb's every 6 scheduled with albuterol every 2 when necessary Continue inhaled steroids and LA bronchodilator Solu-Medrol 60 mg IV every 12 hours Mucinex #4 essential hypertension  Continue antihypertensives #5 chronic kidney disease  Compensated  DVT prophylaxis: Eliquis Consultants: None Code Status: Full code Family Communication: Daughter  Disposition Plan: Patient should be able to return home following admission   Truett Mainland, DO [facility] Pager [phone]  If 7PM-7AM, please contact night-coverage [email or web address] Password [email or web address]

## 2016-04-15 NOTE — ED Provider Notes (Signed)
[facility] DEPT Provider Note   CSN: [id number] Arrival date & time: 04/15/16  1343     History   Chief Complaint Chief Complaint  Patient presents with  . URI  . Atrial Fibrillation    HPI [name] is a 82 y.o. female.  Patient complains of cough congestion shortness of breath for a few days. No sputum production   The history is provided by the patient. No language interpreter was used.  Shortness of Breath  This is a recurrent problem. The problem occurs continuously.The current episode started more than 2 days ago. The problem has not changed since onset.Pertinent negatives include no fever, no headaches, no cough, no chest pain, no abdominal pain and no rash. It is unknown what precipitated the problem. She has tried nothing for the symptoms. The treatment provided no relief.    Past Medical History:  Diagnosis Date  . Anxiety   . Arthritis   . Bursitis    left shoulder  . Cataract   . CHF (congestive heart failure) (HCC)    EF 55-60%  . Complication of anesthesia   . COPD (chronic obstructive pulmonary disease) ([place])   . Coronary atherosclerosis of native coronary artery    a. DES to CX, moderately severe stenosis RCA, mild stenosis LAD 04/2013  . Depression   . Dysphagia, unspecified(787.20)   . GERD (gastroesophageal reflux disease)    Hx Schatzki's ring, multiple EGD/ED last 01/06/2004  . Headache   . Heart disease   . Heart murmur    'a littel'  . HTN (hypertension)    Hx of it  . Hyperlipemia   . Hyperlipidemia   . Internal hemorrhoids without mention of complication   . MI (myocardial infarction) 2006  . Microscopic colitis 2003  . Other and unspecified hyperlipidemia   . Panic disorder without agoraphobia   . Paresthesia    hands, feet  . Pneumonia 12/2011  . PONV (postoperative nausea and vomiting)    'a little nausea"  . PVD (peripheral vascular disease) ([place])   . S/P colonoscopy 09/27/2001   internal hemorrhoids, desc colon inflam  polyp, SB BX-chronic duodenitis, colitis  . Shortness of breath   . Sleep disorder    obstructive  . Thyroid disease    recent abnl TSH per pt    Patient Active Problem List   Diagnosis Date Noted  . Atrial fibrillation with RVR ([place]) 04/15/2016  . SOB (shortness of breath) 03/20/2016  . Hypoxia   . Acute on chronic diastolic congestive heart failure ([place])   . Atrial fibrillation ([place])   . Chronic kidney disease, stage III (moderate) 02/17/2016  . Essential hypertension 02/17/2016  . COPD exacerbation ([place]) 02/17/2016  . Acute bronchitis 12/18/2015  . CAD in native artery   . Chest pain at rest 10/15/2015  . Chronic low back pain 10/15/2015  . Hyponatremia 10/15/2015  . Hyperglycemia 10/15/2015  . Thrombocytosis ([place]) 10/15/2015  . Atypical chest pain 10/15/2015  . Depression   . Anxiety   . Gastroesophageal reflux disease without esophagitis   . Mild cognitive impairment 10/14/2015  . Iron deficiency anemia due to chronic blood loss   . Meningitis 10/01/2015  . Coronary artery disease due to lipid rich plaque   . NSVT (nonsustained ventricular tachycardia) ([place])   . PAF (paroxysmal atrial fibrillation) ([place])   . CSF leak 09/27/2015  . Spondylolisthesis of lumbar region 09/24/2015  . Gastric polyp   . History of colonic polyps   . Chronic diarrhea   .  Diarrhea 04/01/2015  . Esophageal dysphagia 04/01/2015  . PAOD (peripheral arterial occlusive disease) ([place]) 03/05/2015  . Pain in the chest   . Hyperlipidemia   . Weight gain 08/12/2014  . Normocytic anemia 08/07/2014  . Hemorrhoids 08/06/2014  . Diastolic CHF, acute on chronic (HCC) 07/30/2014  . CHF (congestive heart failure) ([place]) 07/29/2014  . Rectal bleeding 07/06/2014  . Constipation 07/06/2014  . Lower extremity edema 11/24/2013  . Acute kidney failure ([place]) 11/24/2013  . Hypokalemia 11/24/2013  . Other and unspecified angina pectoris 05/14/2013  . Hematochezia 02/13/2011  . Abdominal pain 02/13/2011  .  Major depression ([place]) 09/28/2010  . FATIGUE 04/13/2009  . Chronic diastolic heart failure ([place]) 11/30/2008  . DYSPNEA 11/30/2008  . HYPERLIPIDEMIA-MIXED 11/27/2008  . CAD, NATIVE VESSEL - PCI + DES to left circumflex 05/14/13 11/27/2008  . Peripheral vascular disease ([place]) 11/27/2008  . PANIC ATTACK 02/28/2008  . MI 02/28/2008  . Internal hemorrhoids 02/28/2008  . GASTROESOPHAGEAL REFLUX DISEASE, CHRONIC 02/28/2008  . COLITIS 02/28/2008  . Dysphagia 02/28/2008    Past Surgical History:  Procedure Laterality Date  . ABDOMINAL HYSTERECTOMY    . ANTERIOR AND POSTERIOR REPAIR     with resection of vagina  . APPENDECTOMY    . BACK SURGERY    . BIOPSY  07/05/2015   Procedure: BIOPSY;  Surgeon: Daneil Dolin, MD;  Location: [facility];  Service: Endoscopy;;  gastric polyp biopsy, ascending colon biopsy  . BLADDER SUSPENSION  11/09/2011   Procedure: TRANSVAGINAL TAPE (TVT) PROCEDURE;  Surgeon: Marissa Nestle, MD;  Location: [facility] ORS;  Service: Urology;  Laterality: N/A;  . bladder tack  06/2010  . BREAST LUMPECTOMY  1998   left, benign  . CARDIAC CATHETERIZATION    . CARDIAC CATHETERIZATION    . CARDIAC CATHETERIZATION N/A 12/16/2015   Procedure: Left Heart Cath and Coronary Angiography;  Surgeon: Troy Sine, MD;  Location: [facility] CV LAB;  Service: Cardiovascular;  Laterality: N/A;  . [place]   left  . CHOLECYSTECTOMY  1998  . COLONOSCOPY  03/16/2011   multiple hyperplastic colon polyps, sigmoid diverticulosis, melanosis coli  . COLONOSCOPY WITH PROPOFOL N/A 07/05/2015   RMR:one 5 mm polyp in descending colon  . CORONARY ANGIOPLASTY WITH STENT PLACEMENT    . ESOPHAGEAL DILATION N/A 07/05/2015   Procedure: ESOPHAGEAL DILATION;  Surgeon: Daneil Dolin, MD;  Location: [facility];  Service: Endoscopy;  Laterality: N/A;  . ESOPHAGOGASTRODUODENOSCOPY (EGD) WITH PROPOFOL N/A 07/05/2015   LOV:FIEPPI  . JOINT REPLACEMENT Right 2007  . left hand surgery      . LEFT HEART CATHETERIZATION WITH CORONARY ANGIOGRAM N/A 05/14/2013   Procedure: LEFT HEART CATHETERIZATION WITH CORONARY ANGIOGRAM;  Surgeon: Blane Ohara, MD;  Location: [facility] LAB;  Service: Cardiovascular;  Laterality: N/A;  . left rotator cuff surgery    . LUMBAR LAMINECTOMY/DECOMPRESSION MICRODISCECTOMY N/A 10/11/2012   Procedure: LUMBAR LAMINECTOMY/DECOMPRESSION MICRODISCECTOMY 2 LEVELS;  Surgeon: Floyce Stakes, MD;  Location: [facility] NEURO ORS;  Service: Neurosurgery;  Laterality: N/A;  L3-4 L4-5 Laminectomy  . LUMBAR WOUND DEBRIDEMENT N/A 09/27/2015   Procedure: Exploration of Lumbar Wound w/ Repair CSF Leak/Lumbar Drain Placement;  Surgeon: Leeroy Cha, MD;  Location: [facility] NEURO ORS;  Service: Neurosurgery;  Laterality: N/A;  . MALONEY DILATION  03/16/2011   Gastritis. No H.pylori on bx. 30F maloney dilation with disruption of  occult cevical esophageal web  . NASAL SINUS SURGERY    . right knee replacement  2007  . right leg benign tumor    . SHOULDER SURGERY Left   . TONSILLECTOMY    . unspecified area, hysterectomy  1972   partial    OB History    Gravida Para Term Preterm AB Living   8 6     2 5    SAB TAB Ectopic Multiple Live Births                   Home Medications    Prior to Admission medications   Medication Sig Start Date End Date Taking? Authorizing Provider  ALPRAZolam (XANAX) 0.25 MG tablet Take 0.25 mg by mouth 2 (two) times daily as needed for anxiety.    Yes Historical Provider, MD  apixaban (ELIQUIS) 5 MG TABS tablet Take 1 tablet (5 mg total) by mouth 2 (two) times daily. 03/24/16  Yes Herminio Commons, MD  fluticasone (FLONASE) 50 MCG/ACT nasal spray Place 2 sprays into both nostrils daily.    Yes Historical Provider, MD  ipratropium-albuterol (DUONEB) 0.5-2.5 (3) MG/3ML SOLN Take 3 mLs by nebulization every 6 (six) hours. 12/18/15  Yes Belkys A Regalado, MD  loratadine (CLARITIN) 10 MG tablet Take 10 mg by mouth every morning.   Yes Historical  Provider, MD  nitroGLYCERIN (NITROSTAT) 0.4 MG SL tablet Place 0.4 mg under the tongue every 5 (five) minutes as needed for chest pain. Reported on 08/04/2015   Yes Historical Provider, MD  acetaminophen (TYLENOL) 500 MG tablet Take 500 mg by mouth daily as needed for headache.     Historical Provider, MD  carvedilol (COREG) 25 MG tablet Take 12.5 mg by mouth 2 (two) times daily with a meal.     Historical Provider, MD  diltiazem (CARDIZEM CD) 240 MG 24 hr capsule Take 1 capsule (240 mg total) by mouth daily. 02/21/16   Kathie Dike, MD  DULoxetine (CYMBALTA) 60 MG capsule Take 60 mg by mouth at bedtime.     Historical Provider, MD  furosemide (LASIX) 20 MG tablet Take 1 tablet (20 mg total) by mouth daily. 05/25/15   Lendon Colonel, NP  gabapentin (NEURONTIN) 300 MG capsule Take 1 capsule (300 mg total) by mouth 3 (three) times daily. 10/08/15   Kevan Ny Ditty, MD  hydrocortisone (ANUSOL-HC) 2.5 % rectal cream Place 1 application rectally 2 (two) times daily. 03/01/16   Annitta Needs, NP  Melatonin 3 MG CAPS Take 1 capsule by mouth at bedtime.    Historical Provider, MD  Oxycodone HCl 10 MG TABS Take 5-10 mg by mouth every 4 (four) hours as needed. For pain 03/13/16   Historical Provider, MD  pantoprazole (PROTONIX) 40 MG tablet Take 40 mg by mouth every morning.     Historical Provider, MD  potassium chloride (K-DUR,KLOR-CON) 10 MEQ tablet TAKE TWO (2) TABLETS BY MOUTH DAILY. 03/24/16   Herminio Commons, MD    Family History Family History  Problem Relation Age of Onset  . Stroke Mother   . Parkinson's disease Father   . Coronary artery disease Other     family Hx-sons  . Cancer Other   . Stroke Other     family Hx  . Hypertension Other     family Hx  . Diabetes Brother   . Heart disease Son     before age 60  . Diabetes Son   . Stroke Daughter 38    Social History Social History  Substance Use Topics  . Smoking status: Former Smoker  Packs/day: 1.00    Years: 64.00     Types: Cigarettes    Start date: 12/24/1947    Quit date: 11/17/2001  . Smokeless tobacco: Never Used     Comment: Quit smoking in 2003  . Alcohol use No     Allergies   Cephalosporins; Levaquin [levofloxacin in d5w]; Macrodantin [nitrofurantoin macrocrystal]; Phenothiazines; Polysorbate; Prednisone; Buspirone; Cardura [doxazosin mesylate]; Codeine; Acyclovir and related; Prochlorperazine; Ranexa [ranolazine]; Atorvastatin; Ofloxacin; Other; Penicillins; and Pimozide   Review of Systems Review of Systems  Constitutional: Negative for appetite change, fatigue and fever.  HENT: Negative for congestion, ear discharge and sinus pressure.   Eyes: Negative for discharge.  Respiratory: Positive for shortness of breath. Negative for cough.   Cardiovascular: Positive for palpitations. Negative for chest pain.  Gastrointestinal: Negative for abdominal pain and diarrhea.  Genitourinary: Negative for frequency and hematuria.  Musculoskeletal: Negative for back pain.  Skin: Negative for rash.  Neurological: Negative for seizures and headaches.  Psychiatric/Behavioral: Negative for hallucinations.     Physical Exam Updated Vital Signs BP 123/65   Pulse 120   Temp 98.5 F (36.9 C) (Oral)   Resp 18   Wt 158 lb (71.7 kg)   SpO2 95%   BMI 29.85 kg/m   Physical Exam  Constitutional: She is oriented to person, place, and time. She appears well-developed.  HENT:  Head: Normocephalic.  Eyes: Conjunctivae and EOM are normal. No scleral icterus.  Neck: Neck supple. No thyromegaly present.  Cardiovascular: Exam reveals no gallop and no friction rub.   No murmur heard. Rapid irregular rate  Pulmonary/Chest: No stridor. She has wheezes. She has no rales. She exhibits no tenderness.  Abdominal: She exhibits no distension. There is no tenderness. There is no rebound.  Musculoskeletal: Normal range of motion. She exhibits no edema.  Lymphadenopathy:    She has no cervical adenopathy.    Neurological: She is oriented to person, place, and time. She exhibits normal muscle tone. Coordination normal.  Skin: No rash noted. No erythema.  Psychiatric: She has a normal mood and affect. Her behavior is normal.     ED Treatments / Results  Labs (all labs ordered are listed, but only abnormal results are displayed) Labs Reviewed  CBC WITH DIFFERENTIAL/PLATELET - Abnormal; Notable for the following:       Result Value   WBC 10.9 (*)    Hemoglobin 10.2 (*)    HCT 31.6 (*)    MCH 25.6 (*)    RDW 15.8 (*)    Neutro Abs 8.4 (*)    All other components within normal limits  COMPREHENSIVE METABOLIC PANEL - Abnormal; Notable for the following:    Chloride 97 (*)    Glucose, Bld 125 (*)    Creatinine, Ser 1.13 (*)    GFR calc non Af Amer 44 (*)    GFR calc Af Amer 51 (*)    All other components within normal limits  BRAIN NATRIURETIC PEPTIDE - Abnormal; Notable for the following:    B Natriuretic Peptide 458.0 (*)    All other components within normal limits  I-STAT TROPOININ, ED    EKG  EKG Interpretation  Date/Time:  Saturday April 15 2016 13:58:35 EST Ventricular Rate:  110 PR Interval:    QRS Duration: 72 QT Interval:  346 QTC Calculation: 468 R Axis:   -27 Text Interpretation:  Atrial fibrillation with rapid ventricular response Low voltage QRS Cannot rule out Anterior infarct , age undetermined Abnormal ECG Confirmed by Dominique Ressel  MD, [place] John [phone]) on 04/15/2016 2:17:43 PM       Radiology Dg Chest Portable 1 View  Result Date: 04/15/2016 CLINICAL DATA:  SOB, Pt here with URI which began Wednesday. Pt has been having coughing with this and has felt like her heart was racing and has been sob. Pt states that she has not been able to ambulate due to sob and he heart racingHISTORY OF CHF, MI, HTN, COPD, PNEUMONIA EXAM: PORTABLE CHEST 1 VIEW COMPARISON:  03/20/2016 FINDINGS: Cardiac silhouette is mildly enlarged. No convincing mediastinal or hilar masses. There are  irregularly thickened interstitial markings bilaterally mildly increased when compared to a chest radiograph dated 10/15/2015, but similar to the most recent prior exam. Findings may all be chronic. A mild degree of interstitial edema is suspected given the history of shortness of breath. No lung consolidation is seen to suggest pneumonia. No convincing pleural effusion. No pneumothorax. Skeletal structures are demineralized. There are old healed left rib fractures. IMPRESSION: 1. Cardiomegaly with interstitial thickening. Suspect mild congestive heart failure superimposed on chronic interstitial changes. No evidence of pneumonia. Electronically Signed   By: Lajean Manes M.D.   On: 04/15/2016 15:19    Procedures Procedures (including critical care time)  Medications Ordered in ED Medications  diltiazem (CARDIZEM) 100 mg in dextrose 5 % 100 mL (1 mg/mL) infusion (not administered)  diltiazem (CARDIZEM) injection 10 mg (not administered)  furosemide (LASIX) injection 20 mg (not administered)  methylPREDNISolone sodium succinate (SOLU-MEDROL) 125 mg/2 mL injection 125 mg (125 mg Intravenous Given 04/15/16 1444)  ipratropium-albuterol (DUONEB) 0.5-2.5 (3) MG/3ML nebulizer solution 3 mL (3 mLs Nebulization Given 04/15/16 1502)  albuterol (PROVENTIL) (2.5 MG/3ML) 0.083% nebulizer solution 2.5 mg (2.5 mg Nebulization Given 04/15/16 1502)  diltiazem (CARDIZEM) injection 10 mg (10 mg Intravenous Given 04/15/16 1458)     Initial Impression / Assessment and Plan / ED Course  I have reviewed the triage vital signs and the nursing notes.  Pertinent labs & imaging results that were available during my care of the patient were reviewed by me and considered in my medical decision making (see chart for details).  Clinical Course     CRITICAL CARE Performed by: Arney Mayabb L Total critical care time: 35  minutes Critical care time was exclusive of separately billable procedures and treating other  patients. Critical care was necessary to treat or prevent imminent or life-threatening deterioration. Critical care was time spent personally by me on the following activities: development of treatment plan with patient and/or surrogate as well as nursing, discussions with consultants, evaluation of patient's response to treatment, examination of patient, obtaining history from patient or surrogate, ordering and performing treatments and interventions, ordering and review of laboratory studies, ordering and review of radiographic studies, pulse oximetry and re-evaluation of patient's condition.   Final Clinical Impressions(s) / ED Diagnoses   Final diagnoses:  Atrial fibrillation with RVR ([place])   Patient in mild congestive heart failure and rapid atrial fib. Patient will be admitted to stepdown on a diltiazem drip New Prescriptions New Prescriptions   No medications on file     Milton Ferguson, MD 04/15/16 1719

## 2016-04-16 ENCOUNTER — Inpatient Hospital Stay (HOSPITAL_COMMUNITY): Payer: Medicare Other

## 2016-04-16 LAB — BASIC METABOLIC PANEL
Anion gap: 10 (ref 5–15)
BUN: 21 mg/dL — AB (ref 6–20)
CHLORIDE: 99 mmol/L — AB (ref 101–111)
CO2: 25 mmol/L (ref 22–32)
CREATININE: 1.31 mg/dL — AB (ref 0.44–1.00)
Calcium: 8.5 mg/dL — ABNORMAL LOW (ref 8.9–10.3)
GFR calc Af Amer: 43 mL/min — ABNORMAL LOW (ref >60)
GFR calc non Af Amer: 37 mL/min — ABNORMAL LOW (ref >60)
Glucose, Bld: 238 mg/dL — ABNORMAL HIGH (ref 65–99)
POTASSIUM: 3.8 mmol/L (ref 3.5–5.1)
Sodium: 134 mmol/L — ABNORMAL LOW (ref 135–145)

## 2016-04-16 LAB — TSH: TSH: 3 u[IU]/mL (ref 0.350–4.500)

## 2016-04-16 MED ORDER — BISACODYL 5 MG PO TBEC
10.0000 mg | DELAYED_RELEASE_TABLET | Freq: Every day | ORAL | Status: DC | PRN
  Administered 2016-04-16: 10 mg via ORAL
  Filled 2016-04-16: qty 2

## 2016-04-16 MED ORDER — IPRATROPIUM-ALBUTEROL 0.5-2.5 (3) MG/3ML IN SOLN
3.0000 mL | Freq: Three times a day (TID) | RESPIRATORY_TRACT | Status: DC
  Administered 2016-04-16 – 2016-04-18 (×7): 3 mL via RESPIRATORY_TRACT
  Filled 2016-04-16 (×7): qty 3

## 2016-04-16 MED ORDER — DILTIAZEM HCL ER COATED BEADS 240 MG PO CP24
240.0000 mg | ORAL_CAPSULE | Freq: Every day | ORAL | Status: DC
  Administered 2016-04-16: 240 mg via ORAL
  Filled 2016-04-16: qty 1

## 2016-04-16 MED ORDER — ALBUTEROL SULFATE (2.5 MG/3ML) 0.083% IN NEBU: 2.5000 mg | INHALATION_SOLUTION | RESPIRATORY_TRACT | Status: DC | PRN

## 2016-04-16 MED ORDER — SIMETHICONE 80 MG PO CHEW
160.0000 mg | CHEWABLE_TABLET | Freq: Four times a day (QID) | ORAL | Status: DC | PRN
  Administered 2016-04-16: 160 mg via ORAL
  Filled 2016-04-16: qty 2

## 2016-04-16 NOTE — Progress Notes (Signed)
PT C/O RT SHOULDER BUSITIS PAIN THAT IS TRAVELING DOWN RT ARM. ALSO C/O CONSTIPATION AND ABDOMINAL GAS PAIN. ATTEMPTED TO HAVE BM ON BSC. ONLY PASSED GAS.OXYCODONE GIVEN FOR PAIN. BISCODYL PO FIR CONSTIPATION AND MYLICON CHEWALBE TABS 2 PO FOR GAS. PT ANXIOUS. O2 STARTED AT 2L/MIN VIA [place] AT PT'S REQUEST.

## 2016-04-16 NOTE — Evaluation (Signed)
Physical Therapy Evaluation Patient Details Name: [name] MRN: [id number] DOB: Sep 17, 1932 Today's Date: 04/16/2016   History of Present Illness  [name] is a 82 y.o. female with a history of PAF CHADs2 VASC of 5, hypertension, hyperlipidemia, coronary artery disease status post stent, CHF with LVEF of 55% on echo 12/17/15, COPD, GERD. Patient presents with 3 days of worsening shortness of breath, worse with ambulation and stairs and improved with rest. She also complains of nausea and has been unable to take her medications for the past 3 days, including her diuretics and her diltiazem. She denies vomiting. No fevers, chills, abdominal pain.  Clinical Impression  Patient received upright in chair in good spirits and willing to participate in skilled PT services this afternoon. Patient reports she is very independent at baseline, and does have family that lives with her that is able to give her assistance if needed. She does demonstrate some mild strength deficits, including core weakness, however is able to perform all functional transfers and gait with walker with supervision and intermittent cues for safety. Gait distance limited by SOB today. Patient demonstrates mild unsteadiness without walker and DPT encourages use of this device moving forward; also educated patient regarding general safety with mobility as well as encouraged patient to talk to RN/MD about possible medical causes regarding significant SOB. Note that while HR did fluctuate throughout session, O2 appeared to remain WNL with no significant drop on RA throughout session. Patient will continue to receive skilled PT services during her stay, and will benefit from [place] and use of RW when she is discharged by MD. Patient left sitting up in her chair with all needs met. RN advised regarding mobility status and DPT DC recommendations.     Follow Up Recommendations Home health PT    Equipment Recommendations  Standard  walker    Recommendations for Other Services OT consult     Precautions / Restrictions Precautions Precautions: Fall Restrictions Weight Bearing Restrictions: No      Mobility  Bed Mobility               General bed mobility comments: DNT patient already up in chair but reports no difficulties   Transfers Overall transfer level: Needs assistance Equipment used: Rolling walker (2 wheeled) Transfers: Sit to/from Stand Sit to Stand: Supervision         General transfer comment: cues for safety   Ambulation/Gait Ambulation/Gait assistance: Supervision Ambulation Distance (Feet): 15 Feet Assistive device: Rolling walker (2 wheeled) Gait Pattern/deviations: WFL(Within Functional Limits)   Gait velocity interpretation: <1.8 ft/sec, indicative of risk for recurrent falls General Gait Details: limited by SOB   Stairs            Wheelchair Mobility    Modified Rankin (Stroke Patients Only)       Balance Overall balance assessment: No apparent balance deficits (not formally assessed)                                           Pertinent Vitals/Pain Pain Assessment: No/denies pain    Home Living Family/patient expects to be discharged to:: Private residence Living Arrangements: Children Available Help at Discharge: Available 24 hours/day Type of Home: House Home Access: Ramped entrance     Home Layout: One level Home Equipment: Grab bars - tub/shower;Hand held shower head;Tub bench;Bedside commode;Cane - single point;Walker - 4 wheels  Additional Comments: Pt lives with her children in a 1 story home.    Prior Function Level of Independence: Independent with assistive device(s)         Comments: pt was amb without AD, bathing, dressing, cooking, cleaning and driving     Hand Dominance        Extremity/Trunk Assessment   Upper Extremity Assessment Upper Extremity Assessment: Defer to OT evaluation    Lower Extremity  Assessment Lower Extremity Assessment: Generalized weakness       Communication   Communication: No difficulties  Cognition Arousal/Alertness: Awake/alert Behavior During Therapy: WFL for tasks assessed/performed Overall Cognitive Status: Within Functional Limits for tasks assessed                      General Comments      Exercises     Assessment/Plan    PT Assessment Patient needs continued PT services  PT Problem List Decreased strength;Decreased mobility;Decreased safety awareness;Decreased coordination;Decreased activity tolerance          PT Treatment Interventions DME instruction;Therapeutic activities;Gait training;Therapeutic exercise;Patient/family education;Balance training;Functional mobility training;Neuromuscular re-education    PT Goals (Current goals can be found in the Care Plan section)  Acute Rehab PT Goals Patient Stated Goal: to go home, get back to normal activities  PT Goal Formulation: With patient Time For Goal Achievement: 04/30/16 Potential to Achieve Goals: Good    Frequency Min 3X/week   Barriers to discharge        Co-evaluation               End of Session Equipment Utilized During Treatment: Gait belt Activity Tolerance: Patient tolerated treatment well;Other (comment) (limited by SOB ) Patient left: in chair;with call bell/phone within reach Nurse Communication: Mobility status (HHPT, good mobilty but limited by SOB )    Functional Assessment Tool Used: Based on skilled clinical assessment of functional mobility, transfers, balance, strength, gait, functional activity tolerance  Functional Limitation: Mobility: Walking and moving around Mobility: Walking and Moving Around Current Status (M0802): At least 40 percent but less than 60 percent impaired, limited or restricted Mobility: Walking and Moving Around Goal Status [phone]): At least 20 percent but less than 40 percent impaired, limited or restricted    Time:  2244-9753 PT Time Calculation (min) (ACUTE ONLY): 27 min   Charges:   PT Evaluation $PT Eval Low Complexity: 1 Procedure PT Treatments $Self Care/Home Management: 8-22   PT G Codes:   PT G-Codes **NOT FOR INPATIENT CLASS** Functional Assessment Tool Used: Based on skilled clinical assessment of functional mobility, transfers, balance, strength, gait, functional activity tolerance  Functional Limitation: Mobility: Walking and moving around Mobility: Walking and Moving Around Current Status (Y0511): At least 40 percent but less than 60 percent impaired, limited or restricted Mobility: Walking and Moving Around Goal Status [phone]): At least 20 percent but less than 40 percent impaired, limited or restricted    Deniece Ree PT, DPT [phone]

## 2016-04-16 NOTE — Progress Notes (Signed)
PROGRESS NOTE                                                                                                                                                                                                             Patient Demographics:    [name], is a 82 y.o. female, DOB - 1932-04-23, BDZ:[id number]  Admit date - 04/15/2016   Admitting Physician Truett Mainland, DO  Outpatient Primary MD for the patient is [facility], PA-C  LOS - 1  Chief Complaint  Patient presents with  . URI  . Atrial Fibrillation       Brief Narrative   [name] is a 82 y.o. female with a history of PAF CHADs2 VASC of 5, hypertension, hyperlipidemia, coronary artery disease status post stent, CHF with LVEF of 55% on echo 12/17/15, COPD, GERD. Patient presents with 3 days of worsening shortness of breath, worse with ambulation and stairs and improved with rest. She also complains of nausea and has been unable to take her medications for the past 3 days, including her diuretics and her diltiazem. She denies vomiting. No fevers, chills, abdominal pain.  In the ER she was diagnosed with A. fib with RVR acute on chronic diastolic CHF and possible URI.   Subjective:    [name] today has, No headache, No chest pain, No abdominal pain - No Nausea, No new weakness tingling or numbness, No Cough - Improved SOB.    Assessment  & Plan :     1.Chronic atrial fibrillation in RVR [place] vasc 2 score of 5. Was on Cardizem drip now and rate control, transition to oral Coreg and Cardizem, continue Eliquis. Monitor on telemetry.  2. Acute hypoxic respiratory failure due to acute on chronic diastolic CHF EF 34% on recent echocardiogram. Agree with IV diuretics and rate control, monitor with supportive care.  3. COPD with possible mild URI and excessive patient. Continue low-dose Medrol, nebulizer treatments and oxygen as needed. Advance activity  and monitor.  4. GERD. On PPI.  5. CKD 3. Baseline creatinine 1.3. Close to baseline.    Family Communication  :  None  Code Status :  Full  Diet : Diet Heart Room service appropriate? Yes; Fluid consistency: Thin; Fluid restriction: 1500 mL Fluid   Disposition Plan  :  Tele  Consults  :  None  Procedures  :    DVT Prophylaxis  :  Eliquis  Lab Results  Component Value Date   PLT 383 04/15/2016    Inpatient Medications  Scheduled Meds: . apixaban  5 mg Oral BID  . carvedilol  12.5 mg Oral BID WC  . diltiazem  240 mg Oral Daily  . DULoxetine  60 mg Oral QHS  . fluticasone  2 spray Each Nare Daily  . furosemide  40 mg Intravenous BID  . gabapentin  300 mg Oral TID  . ipratropium-albuterol  3 mL Nebulization TID  . methylPREDNISolone (SOLU-MEDROL) injection  60 mg Intravenous Q12H  . pantoprazole  40 mg Oral q morning - 10a  . potassium chloride  40 mEq Oral BID  . sodium chloride flush  3 mL Intravenous Q12H   Continuous Infusions: . diltiazem (CARDIZEM) infusion 5 mg/hr (04/16/16 0600)   PRN Meds:.sodium chloride, acetaminophen, albuterol, ALPRAZolam, ondansetron (ZOFRAN) IV, oxyCODONE, sodium chloride flush  Antibiotics  :    Anti-infectives    None         Objective:   Vitals:   04/16/16 0800 04/16/16 0846 04/16/16 0900 04/16/16 0950  BP: 95/65  (!) 98/58 96/62  Pulse: 74  85   Resp: (!) 22  16   Temp:      TempSrc:      SpO2: 94% 95% 96%   Weight:      Height:        Wt Readings from Last 3 Encounters:  04/16/16 71.5 kg (157 lb 10.1 oz)  03/24/16 76.6 kg (168 lb 14.4 oz)  03/22/16 74.5 kg (164 lb 3.2 oz)     Intake/Output Summary (Last 24 hours) at 04/16/16 1041 Last data filed at 04/16/16 0951  Gross per 24 hour  Intake              543 ml  Output              500 ml  Net               43 ml     Physical Exam  Awake Alert, Oriented X 3, No new F.N deficits, Normal affect [place].AT,PERRAL Supple Neck,No JVD, No cervical  lymphadenopathy appriciated.  Symmetrical Chest wall movement, Good air movement bilaterally, few rales iRRR,No Gallops,Rubs or new Murmurs, No Parasternal Heave +ve B.Sounds, Abd Soft, No tenderness, No organomegaly appriciated, No rebound - guarding or rigidity. No Cyanosis, Clubbing or edema, No new Rash or bruise       Data Review:    CBC  Recent Labs Lab 04/15/16 1423  WBC 10.9*  HGB 10.2*  HCT 31.6*  PLT 383  MCV 79.4  MCH 25.6*  MCHC 32.3  RDW 15.8*  LYMPHSABS 1.4  MONOABS 0.9  EOSABS 0.1  BASOSABS 0.0    Chemistries   Recent Labs Lab 04/15/16 1423 04/16/16 0408  NA 136 134*  K 3.6 3.8  CL 97* 99*  CO2 30 25  GLUCOSE 125* 238*  BUN 13 21*  CREATININE 1.13* 1.31*  CALCIUM 9.1 8.5*  AST 21  --   ALT 15  --   ALKPHOS 118  --   BILITOT 1.0  --    ------------------------------------------------------------------------------------------------------------------ No results for input(s): CHOL, HDL, LDLCALC, TRIG, CHOLHDL, LDLDIRECT in the last 72 hours.  Lab Results  Component Value Date   HGBA1C 5.9 (H) 11/24/2013   ------------------------------------------------------------------------------------------------------------------  Recent Labs  04/15/16 0614  TSH 3.000   ------------------------------------------------------------------------------------------------------------------ No results  for input(s): VITAMINB12, FOLATE, FERRITIN, TIBC, IRON, RETICCTPCT in the last 72 hours.  Coagulation profile No results for input(s): INR, PROTIME in the last 168 hours.  No results for input(s): DDIMER in the last 72 hours.  Cardiac Enzymes No results for input(s): CKMB, TROPONINI, MYOGLOBIN in the last 168 hours.  Invalid input(s): CK ------------------------------------------------------------------------------------------------------------------    Component Value Date/Time   BNP 458.0 (H) 04/15/2016 1423    Micro Results Recent Results (from  the past 240 hour(s))  MRSA PCR Screening     Status: None   Collection Time: 04/15/16  7:00 PM  Result Value Ref Range Status   MRSA by PCR NEGATIVE NEGATIVE Final    Comment:        The GeneXpert MRSA Assay (FDA approved for NASAL specimens only), is one component of a comprehensive MRSA colonization surveillance program. It is not intended to diagnose MRSA infection nor to guide or monitor treatment for MRSA infections.     Radiology Reports  Dg Chest Portable 1 View  Result Date: 04/15/2016 CLINICAL DATA:  SOB, Pt here with URI which began Wednesday. Pt has been having coughing with this and has felt like her heart was racing and has been sob. Pt states that she has not been able to ambulate due to sob and he heart racingHISTORY OF CHF, MI, HTN, COPD, PNEUMONIA EXAM: PORTABLE CHEST 1 VIEW COMPARISON:  03/20/2016 FINDINGS: Cardiac silhouette is mildly enlarged. No convincing mediastinal or hilar masses. There are irregularly thickened interstitial markings bilaterally mildly increased when compared to a chest radiograph dated 10/15/2015, but similar to the most recent prior exam. Findings may all be chronic. A mild degree of interstitial edema is suspected given the history of shortness of breath. No lung consolidation is seen to suggest pneumonia. No convincing pleural effusion. No pneumothorax. Skeletal structures are demineralized. There are old healed left rib fractures. IMPRESSION: 1. Cardiomegaly with interstitial thickening. Suspect mild congestive heart failure superimposed on chronic interstitial changes. No evidence of pneumonia. Electronically Signed   By: Lajean Manes M.D.   On: 04/15/2016 15:19    Time Spent in minutes  30   Launa Goedken K M.D on 04/16/2016 at 10:41 AM  Between 7am to 7pm - Pager - [phone]  After 7pm go to [email or web address] - password [facility]  [facility] -  Office  [phone]

## 2016-04-16 NOTE — Progress Notes (Signed)
PT'S HEART RATE HAS BEEN IN THE 40'S AND 50'S SINCE 1500 04/16/16.

## 2016-04-17 ENCOUNTER — Inpatient Hospital Stay (HOSPITAL_COMMUNITY): Payer: Medicare Other

## 2016-04-17 LAB — BASIC METABOLIC PANEL
Anion gap: 12 (ref 5–15)
BUN: 45 mg/dL — AB (ref 6–20)
CHLORIDE: 95 mmol/L — AB (ref 101–111)
CO2: 23 mmol/L (ref 22–32)
Calcium: 9.1 mg/dL (ref 8.9–10.3)
Creatinine, Ser: 2.91 mg/dL — ABNORMAL HIGH (ref 0.44–1.00)
GFR calc Af Amer: 16 mL/min — ABNORMAL LOW (ref >60)
GFR calc non Af Amer: 14 mL/min — ABNORMAL LOW (ref >60)
GLUCOSE: 242 mg/dL — AB (ref 65–99)
POTASSIUM: 5 mmol/L (ref 3.5–5.1)
Sodium: 130 mmol/L — ABNORMAL LOW (ref 135–145)

## 2016-04-17 MED ORDER — CARVEDILOL 3.125 MG PO TABS
6.2500 mg | ORAL_TABLET | Freq: Two times a day (BID) | ORAL | Status: DC
  Administered 2016-04-17 – 2016-04-18 (×3): 6.25 mg via ORAL
  Filled 2016-04-17 (×3): qty 2

## 2016-04-17 MED ORDER — DILTIAZEM HCL ER COATED BEADS 240 MG PO CP24: 240.0000 mg | ORAL_CAPSULE | Freq: Every day | ORAL | Status: DC

## 2016-04-17 MED ORDER — SODIUM CHLORIDE 0.9 % IV SOLN
INTRAVENOUS | Status: AC
  Administered 2016-04-17 (×2): via INTRAVENOUS

## 2016-04-17 MED ORDER — DILTIAZEM HCL 60 MG PO TABS
60.0000 mg | ORAL_TABLET | Freq: Three times a day (TID) | ORAL | Status: DC
  Administered 2016-04-17 – 2016-04-18 (×4): 60 mg via ORAL
  Filled 2016-04-17: qty 1
  Filled 2016-04-17: qty 2
  Filled 2016-04-17 (×2): qty 1

## 2016-04-17 NOTE — Progress Notes (Signed)
*  PRELIMINARY RESULTS* Echocardiogram 2D Echocardiogram has been performed.  [name] 04/17/2016, 4:04 PM

## 2016-04-17 NOTE — Progress Notes (Signed)
Heart rate is now in the 60's

## 2016-04-17 NOTE — Progress Notes (Signed)
PT'S HEART RATE OCCASSIONAL DROPPING IN THE THE UPPER 30'S. PT ASYMPTOMATIC.[name] Kitchen HOSPITALIST DR Maudie Mercury NOTIFIED AND ORDERS RECEIVED.

## 2016-04-17 NOTE — Progress Notes (Signed)
PROGRESS NOTE                                                                                                                                                                                                             Patient Demographics:    [name], is a 83 y.o. female, DOB - 09/27/1932, WGY:[id number]  Admit date - 04/15/2016   Admitting Physician Truett Mainland, DO  Outpatient Primary MD for the patient is [facility], PA-C  LOS - 2  Chief Complaint  Patient presents with  . URI  . Atrial Fibrillation       Brief Narrative   [name] is a 83 y.o. female with a history of PAF CHADs2 VASC of 5, hypertension, hyperlipidemia, coronary artery disease status post stent, CHF with LVEF of 55% on echo 12/17/15, COPD, GERD. Patient presents with 3 days of worsening shortness of breath, worse with ambulation and stairs and improved with rest. She also complains of nausea and has been unable to take her medications for the past 3 days, including her diuretics and her diltiazem. She denies vomiting. No fevers, chills, abdominal pain.  In the ER she was diagnosed with A. fib with RVR acute on chronic diastolic CHF and possible URI.   Subjective:    [name] today has, No headache, No chest pain, No abdominal pain - No Nausea, No new weakness tingling or numbness, No Cough - Improved SOB.    Assessment  & Plan :     1.Chronic atrial fibrillation in RVR [place] vasc 2 score of 5. Was on Cardizem drip now transitioned to oral Coreg and Cardizem, continue Eliquis. Monitor on telemetry.  2. Acute hypoxic respiratory failure due to acute on chronic diastolic CHF EF 77% on recent echocardiogram. Diuresis with IV Lasix, currently actually little dehydrated, hold Lasix and hydrate.  3. COPD with possible mild URI and excessive patient. Continue low-dose Medrol, nebulizer treatments and oxygen as needed. Advance activity and  monitor.  4. GERD. On PPI.  5. ARF on CKD 3. Baseline creatinine 1.3. Creatinine has bumped, likely has been over diuresed, hold diuresis hydrate and monitor.    Family Communication  :  None  Code Status :  Full  Diet : Diet Heart Room service appropriate? Yes; Fluid consistency: Thin; Fluid restriction: 1500 mL Fluid   Disposition  Plan  :  Most likely discharge home in the morning if creatinine better  Consults  :  None  Procedures  :    DVT Prophylaxis  :  Eliquis  Lab Results  Component Value Date   PLT 383 04/15/2016    Inpatient Medications  Scheduled Meds: . apixaban  5 mg Oral BID  . carvedilol  6.25 mg Oral BID WC  . diltiazem  60 mg Oral Q8H  . DULoxetine  60 mg Oral QHS  . fluticasone  2 spray Each Nare Daily  . gabapentin  300 mg Oral TID  . ipratropium-albuterol  3 mL Nebulization TID  . methylPREDNISolone (SOLU-MEDROL) injection  60 mg Intravenous Q12H  . pantoprazole  40 mg Oral q morning - 10a  . sodium chloride flush  3 mL Intravenous Q12H   Continuous Infusions: . sodium chloride 125 mL/hr at 04/17/16 0949   PRN Meds:.sodium chloride, acetaminophen, albuterol, ALPRAZolam, bisacodyl, ondansetron (ZOFRAN) IV, oxyCODONE, simethicone, sodium chloride flush  Antibiotics  :    Anti-infectives    None         Objective:   Vitals:   04/17/16 0530 04/17/16 0600 04/17/16 0700 04/17/16 0803  BP: (!) 119/57 (!) 110/54 115/81   Pulse: 63 66 72   Resp: 15 15 15    Temp:      TempSrc:      SpO2: 96% 97% 97% 99%  Weight:      Height:        Wt Readings from Last 3 Encounters:  04/17/16 71.5 kg (157 lb 10.1 oz)  03/24/16 76.6 kg (168 lb 14.4 oz)  03/22/16 74.5 kg (164 lb 3.2 oz)     Intake/Output Summary (Last 24 hours) at 04/17/16 1004 Last data filed at 04/16/16 2300  Gross per 24 hour  Intake              360 ml  Output                0 ml  Net              360 ml     Physical Exam  Awake Alert, Oriented X 3, No new F.N  deficits, Normal affect [place].AT,PERRAL Supple Neck,No JVD, No cervical lymphadenopathy appriciated.  Symmetrical Chest wall movement, Good air movement bilaterally, few rales iRRR,No Gallops,Rubs or new Murmurs, No Parasternal Heave +ve B.Sounds, Abd Soft, No tenderness, No organomegaly appriciated, No rebound - guarding or rigidity. No Cyanosis, Clubbing or edema, No new Rash or bruise       Data Review:    CBC  Recent Labs Lab 04/15/16 1423  WBC 10.9*  HGB 10.2*  HCT 31.6*  PLT 383  MCV 79.4  MCH 25.6*  MCHC 32.3  RDW 15.8*  LYMPHSABS 1.4  MONOABS 0.9  EOSABS 0.1  BASOSABS 0.0    Chemistries   Recent Labs Lab 04/15/16 1423 04/16/16 0408 04/17/16 0504  NA 136 134* 130*  K 3.6 3.8 5.0  CL 97* 99* 95*  CO2 30 25 23   GLUCOSE 125* 238* 242*  BUN 13 21* 45*  CREATININE 1.13* 1.31* 2.91*  CALCIUM 9.1 8.5* 9.1  AST 21  --   --   ALT 15  --   --   ALKPHOS 118  --   --   BILITOT 1.0  --   --    ------------------------------------------------------------------------------------------------------------------ No results for input(s): CHOL, HDL, LDLCALC, TRIG, CHOLHDL, LDLDIRECT in the last 72 hours.  Lab Results  Component Value Date   HGBA1C 5.9 (H) 11/24/2013   ------------------------------------------------------------------------------------------------------------------  Recent Labs  04/15/16 0614  TSH 3.000   ------------------------------------------------------------------------------------------------------------------ No results for input(s): VITAMINB12, FOLATE, FERRITIN, TIBC, IRON, RETICCTPCT in the last 72 hours.  Coagulation profile No results for input(s): INR, PROTIME in the last 168 hours.  No results for input(s): DDIMER in the last 72 hours.  Cardiac Enzymes No results for input(s): CKMB, TROPONINI, MYOGLOBIN in the last 168 hours.  Invalid input(s):  CK ------------------------------------------------------------------------------------------------------------------    Component Value Date/Time   BNP 458.0 (H) 04/15/2016 1423    Micro Results Recent Results (from the past 240 hour(s))  MRSA PCR Screening     Status: None   Collection Time: 04/15/16  7:00 PM  Result Value Ref Range Status   MRSA by PCR NEGATIVE NEGATIVE Final    Comment:        The GeneXpert MRSA Assay (FDA approved for NASAL specimens only), is one component of a comprehensive MRSA colonization surveillance program. It is not intended to diagnose MRSA infection nor to guide or monitor treatment for MRSA infections.     Radiology Reports  Dg Chest Portable 1 View  Result Date: 04/15/2016 CLINICAL DATA:  SOB, Pt here with URI which began Wednesday. Pt has been having coughing with this and has felt like her heart was racing and has been sob. Pt states that she has not been able to ambulate due to sob and he heart racingHISTORY OF CHF, MI, HTN, COPD, PNEUMONIA EXAM: PORTABLE CHEST 1 VIEW COMPARISON:  03/20/2016 FINDINGS: Cardiac silhouette is mildly enlarged. No convincing mediastinal or hilar masses. There are irregularly thickened interstitial markings bilaterally mildly increased when compared to a chest radiograph dated 10/15/2015, but similar to the most recent prior exam. Findings may all be chronic. A mild degree of interstitial edema is suspected given the history of shortness of breath. No lung consolidation is seen to suggest pneumonia. No convincing pleural effusion. No pneumothorax. Skeletal structures are demineralized. There are old healed left rib fractures. IMPRESSION: 1. Cardiomegaly with interstitial thickening. Suspect mild congestive heart failure superimposed on chronic interstitial changes. No evidence of pneumonia. Electronically Signed   By: Lajean Manes M.D.   On: 04/15/2016 15:19    Time Spent in minutes  30   SINGH,PRASHANT K M.D on  04/17/2016 at 10:04 AM  Between 7am to 7pm - Pager - [phone]  After 7pm go to [email or web address] - password [facility]  [facility] -  Office  [phone]

## 2016-04-17 NOTE — Progress Notes (Signed)
Dr. Candiss Norse made aware no cardiology coverage at hospital today.

## 2016-04-17 NOTE — Progress Notes (Signed)
Physical Therapy Treatment Patient Details Name: [name] MRN: [id number] DOB: [birth year] Today's Date: 04/17/2016    History of Present Illness [name] is a 83 y.o. female with a history of PAF CHADs2 VASC of 5, hypertension, hyperlipidemia, coronary artery disease status post stent, CHF with LVEF of 55% on echo 12/17/15, COPD, GERD. Patient presents with 3 days of worsening shortness of breath, worse with ambulation and stairs and improved with rest. She also complains of nausea and has been unable to take her medications for the past 3 days, including her diuretics and her diltiazem. She denies vomiting. No fevers, chills, abdominal pain.    PT Comments    Pt supine in bed and friendly to participate with therapy today.  No report of pain today.  Increased distance with gait training with min guard, pt able to demonstrate appropriate gait mechanics with no LOB episodes.  Pt was limited by fatigue with increased distance, no reports of pain through session.  EOS pt left in chair with call bell with reach and chair alarm set.    Follow Up Recommendations        Equipment Recommendations       Recommendations for Other Services       Precautions / Restrictions Precautions Precautions: Fall Restrictions Weight Bearing Restrictions: No    Mobility  Bed Mobility Overal bed mobility: Independent                Transfers Overall transfer level: Modified independent Equipment used: Rolling walker (2 wheeled) Transfers: Sit to/from Stand Sit to Stand: Supervision         General transfer comment: cueing for handplacement, able to follow proper comm  Ambulation/Gait Ambulation/Gait assistance: Supervision Ambulation Distance (Feet): 40 Feet Assistive device: Rolling walker (2 wheeled) Gait Pattern/deviations: WFL(Within Functional Limits)   Gait velocity interpretation: at or above normal speed for age/gender     Stairs            Wheelchair  Mobility    Modified Rankin (Stroke Patients Only)       Balance                                    Cognition Arousal/Alertness: Awake/alert Behavior During Therapy: WFL for tasks assessed/performed Overall Cognitive Status: Within Functional Limits for tasks assessed                      Exercises      General Comments        Pertinent Vitals/Pain Pain Assessment: No/denies pain    Home Living                      Prior Function            PT Goals (current goals can now be found in the care plan section)      Frequency           PT Plan Current plan remains appropriate    Co-evaluation             End of Session Equipment Utilized During Treatment: Gait belt Activity Tolerance: Patient tolerated treatment well Patient left: in chair;with call bell/phone within reach;with chair alarm set     Time: 6578-4696 PT Time Calculation (min) (ACUTE ONLY): 19 min  Charges:  $Gait Training: 8-22 mins  G Codes:     Ihor Austin, LPTA; CBIS [phone]  Aldona Lento 04/17/2016, 1:28 PM

## 2016-04-17 NOTE — Progress Notes (Signed)
Patient for transfer to 300 unit. Report given to [facility], Therapist, sports. IV flushed and patent. Ambulated around room to wheelchair with minimal assist. VS stable upon transfer.

## 2016-04-18 LAB — ECHOCARDIOGRAM COMPLETE
CHL CUP STROKE VOLUME: 41 mL
EWDT: 194 ms
FS: 29 % (ref 28–44)
Height: 61 in
IVS/LV PW RATIO, ED: 0.98
LA ID, A-P, ES: 39 mm
LA diam end sys: 39 mm
LA vol A4C: 61.4 ml
LA vol: 63.1 mL
LADIAMINDEX: 2.19 cm/m2
LAVOLIN: 35.5 mL/m2
LDCA: 2.84 cm2
LV PW d: 10.8 mm — AB (ref 0.6–1.1)
LV dias vol index: 41 mL/m2
LV sys vol: 33 mL (ref 14–42)
LVDIAVOL: 73 mL (ref 46–106)
LVOT SV: 56 mL
LVOT VTI: 19.8 cm
LVOT diameter: 19 mm
LVOT peak grad rest: 2 mmHg
LVOT peak vel: 76.9 cm/s
LVSYSVOLIN: 18 mL/m2
MV Dec: 194
MV Peak grad: 6 mmHg
MVPKEVEL: 126 m/s
P 1/2 time: 453 ms
RV LATERAL S' VELOCITY: 8.27 cm/s
RV TAPSE: 12.2 mm
RV sys press: 45 mmHg
Reg peak vel: 276 cm/s
Simpson's disk: 56
TRMAXVEL: 276 cm/s
VTI: 160 cm
Weight: 2522.06 oz

## 2016-04-18 LAB — BASIC METABOLIC PANEL
ANION GAP: 11 (ref 5–15)
BUN: 58 mg/dL — ABNORMAL HIGH (ref 6–20)
CALCIUM: 8.5 mg/dL — AB (ref 8.9–10.3)
CO2: 20 mmol/L — ABNORMAL LOW (ref 22–32)
CREATININE: 2.5 mg/dL — AB (ref 0.44–1.00)
Chloride: 99 mmol/L — ABNORMAL LOW (ref 101–111)
GFR, EST AFRICAN AMERICAN: 20 mL/min — AB (ref >60)
GFR, EST NON AFRICAN AMERICAN: 17 mL/min — AB (ref >60)
Glucose, Bld: 167 mg/dL — ABNORMAL HIGH (ref 65–99)
Potassium: 5.3 mmol/L — ABNORMAL HIGH (ref 3.5–5.1)
Sodium: 130 mmol/L — ABNORMAL LOW (ref 135–145)

## 2016-04-18 MED ORDER — BISACODYL 10 MG RE SUPP
10.0000 mg | Freq: Every day | RECTAL | Status: DC
  Filled 2016-04-18: qty 1

## 2016-04-18 MED ORDER — APIXABAN 2.5 MG PO TABS
2.5000 mg | ORAL_TABLET | Freq: Two times a day (BID) | ORAL | Status: DC
  Administered 2016-04-18: 2.5 mg via ORAL
  Filled 2016-04-18: qty 1

## 2016-04-18 MED ORDER — SODIUM POLYSTYRENE SULFONATE 15 GM/60ML PO SUSP
15.0000 g | Freq: Once | ORAL | Status: AC
  Administered 2016-04-18: 15 g via ORAL
  Filled 2016-04-18: qty 60

## 2016-04-18 MED ORDER — BISACODYL 5 MG PO TBEC: 5.0000 mg | DELAYED_RELEASE_TABLET | Freq: Every day | ORAL | 0 refills | Status: DC | PRN

## 2016-04-18 MED ORDER — SODIUM CHLORIDE 0.9 % IV SOLN: INTRAVENOUS | Status: DC

## 2016-04-18 MED ORDER — FUROSEMIDE 20 MG PO TABS: 20.0000 mg | ORAL_TABLET | Freq: Every day | ORAL | 0 refills | Status: DC

## 2016-04-18 NOTE — Progress Notes (Signed)
[name] discharged Home per MD order.  Discharge instructions reviewed and discussed with the patient, all questions and concerns answered. Copy of instructions and scripts given to patient.  Allergies as of 04/18/2016      Reactions   Cephalosporins Diarrhea, Nausea Only   Lightheaded   Levaquin [levofloxacin In D5w] Swelling   Macrodantin [nitrofurantoin Macrocrystal] Swelling   Phenothiazines Anaphylaxis, Hives   Polysorbate Anaphylaxis   Prednisone Shortness Of Breath   Buspirone Itching   Cardura [doxazosin Mesylate] Itching   Codeine Itching   Acyclovir And Related    Prochlorperazine Other (See Comments)   unknown   Ranexa [ranolazine]    Severe drop in BP   Atorvastatin Hives   Cramping; tolerates Crestor ok   Ofloxacin Rash   Other Itching, Rash   "WOOL"= make skin look like it has been burned   Penicillins Other (See Comments)   Causes redness all over. Has patient had a PCN reaction causing immediate rash, facial/tongue/throat swelling, SOB or lightheadedness with hypotension: No Has patient had a PCN reaction causing severe rash involving mucus membranes or skin necrosis: No Has patient had a PCN reaction that required hospitalization No Has patient had a PCN reaction occurring within the last 10 years: No If all of the above answers are "NO", then may proceed with Cephalosporin use.   Pimozide Hives, Itching      Medication List    STOP taking these medications   potassium chloride 10 MEQ tablet Commonly known as:  K-DUR,KLOR-CON     TAKE these medications   acetaminophen 500 MG tablet Commonly known as:  TYLENOL Take 500 mg by mouth daily as needed for headache.   ALPRAZolam 0.25 MG tablet Commonly known as:  XANAX Take 0.25 mg by mouth 2 (two) times daily as needed for anxiety.   apixaban 5 MG Tabs tablet Commonly known as:  ELIQUIS Take 1 tablet (5 mg total) by mouth 2 (two) times daily.   bisacodyl 5 MG EC tablet Commonly known as:   DULCOLAX Take 1 tablet (5 mg total) by mouth daily as needed for moderate constipation.   carvedilol 25 MG tablet Commonly known as:  COREG Take 12.5 mg by mouth 2 (two) times daily with a meal.   diltiazem 240 MG 24 hr capsule Commonly known as:  CARDIZEM CD Take 1 capsule (240 mg total) by mouth daily.   DULoxetine 60 MG capsule Commonly known as:  CYMBALTA Take 60 mg by mouth at bedtime.   fluticasone 50 MCG/ACT nasal spray Commonly known as:  FLONASE Place 2 sprays into both nostrils daily.   furosemide 20 MG tablet Commonly known as:  LASIX Take 1 tablet (20 mg total) by mouth daily. Start taking on:  04/20/2016   gabapentin 300 MG capsule Commonly known as:  NEURONTIN Take 1 capsule (300 mg total) by mouth 3 (three) times daily.   hydrocortisone 2.5 % rectal cream Commonly known as:  ANUSOL-HC Place 1 application rectally 2 (two) times daily.   ipratropium-albuterol 0.5-2.5 (3) MG/3ML Soln Commonly known as:  DUONEB Take 3 mLs by nebulization every 6 (six) hours.   loratadine 10 MG tablet Commonly known as:  CLARITIN Take 10 mg by mouth every morning.   Melatonin 3 MG Caps Take 1 capsule by mouth at bedtime.   nitroGLYCERIN 0.4 MG SL tablet Commonly known as:  NITROSTAT Place 0.4 mg under the tongue every 5 (five) minutes as needed for chest pain. Reported on 08/04/2015   Oxycodone  HCl 10 MG Tabs Take 5-10 mg by mouth every 4 (four) hours as needed. For pain   pantoprazole 40 MG tablet Commonly known as:  PROTONIX Take 40 mg by mouth every morning.       Patients skin is clean, dry and intact, no evidence of skin break down. IV site discontinued and catheter remains intact. Site without signs and symptoms of complications. Dressing and pressure applied.  Patient escorted to car by NT in a wheelchair,  no distress noted upon discharge.  [name] [name] 04/18/2016 2:23 PM

## 2016-04-18 NOTE — Care Management Note (Signed)
Case Management Note  Patient Details  Name: FRANCENA ZENDER MRN: [id number] Date of Birth: [birth year] :   Expected Discharge Date:  04/17/16               Expected Discharge Plan:  [place]  In-House Referral:  NA  Discharge planning Services  CM Consult  Post Acute Care Choice:  Home Health Choice offered to:  Patient  DME Arranged:    DME Agency:     HH Arranged:  RN, PT [place] Agency:  [place]  Status of Service:  Completed, signed off  If discussed at [place] of Stay Meetings, dates discussed:    Additional Comments: Patient discharging home today. Recommended for [facility] PT. Patient has had [facility] in past. Romualdo Bolk of [facility] notified and will obtain orders from chart. Patient aware that [facility] has 48 hours to initiate services. No other CM needs.  Ceazia Harb, Chauncey [place], RN 04/18/2016, 9:37 AM

## 2016-04-18 NOTE — Discharge Instructions (Signed)
Follow with Primary MD [name],KATIE, PA-C in 7 days   Get CBC, CMP, 2 view Chest X ray checked  by Primary MD or SNF MD in 5-7 days ( we routinely change or add medications that can affect your baseline labs and fluid status, therefore we recommend that you get the mentioned basic workup next visit with your PCP, your PCP may decide not to get them or add new tests based on their clinical decision)   Activity: As tolerated with Full fall precautions use walker/cane & assistance as needed   Disposition Home    Diet:  Heart Healthy  For Heart failure patients - Check your Weight same time everyday, if you gain over 2 pounds, or you develop in leg swelling, experience more shortness of breath or chest pain, call your Primary MD immediately. Follow Cardiac Low Salt Diet and 1.5 lit/day fluid restriction.   On your next visit with your primary care physician please Get Medicines reviewed and adjusted.   Please request your Prim.MD to go over all Hospital Tests and Procedure/Radiological results at the follow up, please get all Hospital records sent to your Prim MD by signing hospital release before you go home.   If you experience worsening of your admission symptoms, develop shortness of breath, life threatening emergency, suicidal or homicidal thoughts you must seek medical attention immediately by calling 911 or calling your MD immediately  if symptoms less severe.  You Must read complete instructions/literature along with all the possible adverse reactions/side effects for all the Medicines you take and that have been prescribed to you. Take any new Medicines after you have completely understood and accpet all the possible adverse reactions/side effects.   Do not drive, operate heavy machinery, perform activities at heights, swimming or participation in water activities or provide baby sitting services if your were admitted for syncope or siezures until you have seen by Primary MD or a  Neurologist and advised to do so again.  Do not drive when taking Pain medications.    Do not take more than prescribed Pain, Sleep and Anxiety Medications  Special Instructions: If you have smoked or chewed Tobacco  in the last 2 yrs please stop smoking, stop any regular Alcohol  and or any Recreational drug use.  Wear Seat belts while driving.   Please note  You were cared for by a hospitalist during your hospital stay. If you have any questions about your discharge medications or the care you received while you were in the hospital after you are discharged, you can call the unit and asked to speak with the hospitalist on call if the hospitalist that took care of you is not available. Once you are discharged, your primary care physician will handle any further medical issues. Please note that NO REFILLS for any discharge medications will be authorized once you are discharged, as it is imperative that you return to your primary care physician (or establish a relationship with a primary care physician if you do not have one) for your aftercare needs so that they can reassess your need for medications and monitor your lab values.

## 2016-04-18 NOTE — Discharge Summary (Signed)
[name] MWN:[id number] DOB: 20-Mar-1933 DOA: 04/15/2016  PCP: [name]  Admit date: 04/15/2016  Discharge date: 04/18/2016  Admitted From: Home   Disposition:  Home   Recommendations for Outpatient Follow-up:   Follow up with PCP in 1-2 weeks  PCP Please obtain BMP/CBC, 2 view CXR in 1week,  (see Discharge instructions)   PCP Please follow up on the following pending results: Echocardiogram results   Home Health: PT,RN Equipment/Devices: 02 if qualifies, will be checked Consultations: None Discharge Condition: Stable   CODE STATUS: Full   Diet Recommendation: Heart Healthy     Chief Complaint  Patient presents with  . URI  . Atrial Fibrillation     Brief history of present illness from the day of admission and additional interim summary    [name] a 83 y.o.femalewith a history of PAF CHADs2 VASC of 5, hypertension, hyperlipidemia, coronary artery disease status post stent, CHF with LVEF of 55% on echo 12/17/15, COPD, GERD. Patient presents with 3 days of worsening shortness of breath, worse with ambulation and stairs and improved with rest. She also complains of nausea and has been unable to take her medications for the past 3 days, including her diuretics and her diltiazem. She denies vomiting. No fevers, chills, abdominal pain.  In the ER she was diagnosed with A. fib with RVR acute on chronic diastolic CHF and possible URI.  Hospital issues addressed     1.Chronic atrial fibrillation in RVR [place] vasc 2 score of 5. Was on Cardizem drip now transitioned to oral Coreg and Cardizem, continue Eliquis. She was placed on slightly higher than home dose Cardizem but she had episodes of bradycardia hence she has been reverted back to her home dose, requested her to follow with  cardiology in a week. I have set up an appointment. Her repeat echocardiogram is pending must follow with cardiology for report and results next visit.  2. Acute hypoxic respiratory failure due to acute on chronic diastolic CHF EF 40% on recent echocardiogram. It improved after diuresis, she is symptom-free and lungs are clear, will evaluate for home oxygen qualification if she qualifies we'll get home oxygen.  3. COPD with possible mild URI and excessive patient. No wheezing, stopped all steroids, supportive care upon discharge.  4. GERD. On PPI.  5. ARF on CKD 3. Baseline creatinine 1.3. Creatinine had bumped, likely had been over diuresed, Lasix was held she was hydrated with improvement in creatinine, will request her to hold her home Lasix for another 2 days, request PCP to check BMP within a week and readdress diuretic dose. For now I'm discontinuing potassium as her potassium was slightly high today requiring Kayexalate.  6. Borderline hyperkalemia. Patient given Kayexalate, bowel regimen for constipation, requested to follow with PCP within a week for repeat BMP check. Home potassium has been discontinued for now.  Discharge diagnosis     Active Problems:   Chronic kidney disease, stage III (moderate)   Essential hypertension   COPD exacerbation (HCC)   Acute  on chronic diastolic congestive heart failure (HCC)   Atrial fibrillation with RVR ([place])    Discharge instructions      Medication List    STOP taking these medications   potassium chloride 10 MEQ tablet Commonly known as:  K-DUR,KLOR-CON     TAKE these medications   acetaminophen 500 MG tablet Commonly known as:  TYLENOL Take 500 mg by mouth daily as needed for headache.   ALPRAZolam 0.25 MG tablet Commonly known as:  XANAX Take 0.25 mg by mouth 2 (two) times daily as needed for anxiety.   apixaban 5 MG Tabs tablet Commonly known as:  ELIQUIS Take 1 tablet (5 mg total) by mouth 2 (two) times daily.     bisacodyl 5 MG EC tablet Commonly known as:  DULCOLAX Take 1 tablet (5 mg total) by mouth daily as needed for moderate constipation.   carvedilol 25 MG tablet Commonly known as:  COREG Take 12.5 mg by mouth 2 (two) times daily with a meal.   diltiazem 240 MG 24 hr capsule Commonly known as:  CARDIZEM CD Take 1 capsule (240 mg total) by mouth daily.   DULoxetine 60 MG capsule Commonly known as:  CYMBALTA Take 60 mg by mouth at bedtime.   fluticasone 50 MCG/ACT nasal spray Commonly known as:  FLONASE Place 2 sprays into both nostrils daily.   furosemide 20 MG tablet Commonly known as:  LASIX Take 1 tablet (20 mg total) by mouth daily. Start taking on:  04/20/2016   gabapentin 300 MG capsule Commonly known as:  NEURONTIN Take 1 capsule (300 mg total) by mouth 3 (three) times daily.   hydrocortisone 2.5 % rectal cream Commonly known as:  ANUSOL-HC Place 1 application rectally 2 (two) times daily.   ipratropium-albuterol 0.5-2.5 (3) MG/3ML Soln Commonly known as:  DUONEB Take 3 mLs by nebulization every 6 (six) hours.   loratadine 10 MG tablet Commonly known as:  CLARITIN Take 10 mg by mouth every morning.   Melatonin 3 MG Caps Take 1 capsule by mouth at bedtime.   nitroGLYCERIN 0.4 MG SL tablet Commonly known as:  NITROSTAT Place 0.4 mg under the tongue every 5 (five) minutes as needed for chest pain. Reported on 08/04/2015   Oxycodone HCl 10 MG Tabs Take 5-10 mg by mouth every 4 (four) hours as needed. For pain   pantoprazole 40 MG tablet Commonly known as:  PROTONIX Take 40 mg by mouth every morning.       Follow-up Information    [name],KATIE, PA-C. Schedule an appointment as soon as possible for a visit in 1 week(s).   Specialty:  Physician Assistant Contact information: [address] [place] [address]        Kate Sable, MD Follow up on 04/27/2016.   Specialty:  Cardiology Why:  1pm Contact information: [place]  [address] [phone]           Major procedures and Radiology Reports - PLEASE review detailed and final reports thoroughly  -       Dg Chest 2 View  Result Date: 03/20/2016 CLINICAL DATA:  83 y/o F; dry cough and shortness of breath with exertion for 1 week. EXAM: CHEST  2 VIEW COMPARISON:  02/16/2016 chest radiograph FINDINGS: Stable mildly enlarged cardiac silhouette given projection and technique. Aortic atherosclerosis with arch calcification. Increased interstitial markings from prior radiographs. No new consolidation or effusion. Status post cholecystectomy. Bones are unremarkable. IMPRESSION: Diminished interstitial markings from prior  radiographs with residua pulmonary venous hypertension. Stable borderline cardiomegaly. Aortic atherosclerosis. No new focal consolidation or effusion. Electronically Signed   By: Kristine Garbe M.D.   On: 03/20/2016 14:34   Ct Lumbar Spine W Contrast  Result Date: 04/11/2016 CLINICAL DATA:  Predominantly left lower extremity pain including in the anterior thigh. Low back pain and some posterior right lower extremity pain as well. Prior lumbar fusion. EXAM: LUMBAR MYELOGRAM FLUOROSCOPY TIME:  Radiation Exposure Index (as provided by the fluoroscopic device): 255.37 microGray*m^2 Fluoroscopy Time (in minutes and seconds):  16 seconds PROCEDURE: After thorough discussion of risks and benefits of the procedure including bleeding, infection, injury to nerves, blood vessels, adjacent structures as well as headache and CSF leak, written and oral informed consent was obtained. Consent was obtained by Dr. Logan Bores. Time out form was completed. Patient was positioned prone on the fluoroscopy table. Local anesthesia was provided with 1% lidocaine without epinephrine after prepped and draped in the usual sterile fashion. Puncture was performed at L3 using a 5 inch 22-gauge spinal needle via a midline approach. Using a single pass through the dura, the  needle was placed within the thecal sac, with return of clear CSF. 15 mL of Isovue M 200 was injected into the thecal sac, with normal opacification of the nerve roots and cauda equina consistent with free flow within the subarachnoid space. I personally performed the lumbar puncture and administered the intrathecal contrast. I also personally supervised acquisition of the myelogram images. TECHNIQUE: Contiguous axial images were obtained through the Lumbar spine after the intrathecal infusion of infusion. Coronal and sagittal reconstructions were obtained of the axial image sets. COMPARISON:  Lumbar myelogram 08/25/2015 FINDINGS: LUMBAR MYELOGRAM FINDINGS: There is grade 1 retrolisthesis of L2 on L3 and grade 1 anterolisthesis of L3 on L4 without significant change during flexion or extension. Listhesis at these levels has mildly progressed. Trace anterolisthesis of L4 on L5 is unchanged. Sequelae of interval L3-L5 posterior fusion are identified. Small ventral extradural defects are present at L1-2 and L2-3. The thecal sac appears widely patent throughout the lumbar spine without evidence of spinal stenosis or nerve root cut off. Abdominal aortic atherosclerosis is noted. CT LUMBAR MYELOGRAM FINDINGS: There is progressive grade 1 anterolisthesis of L2 on L3 which currently measures 5 mm (Previously 1-2 mm). Anterolisthesis of L3 on L4 is new and measures 2-3 mm. There is trace anterolisthesis of L4 on L5 and L5 on S1, unchanged. L3-L5 posterior fusion has been performed in the interim. There is minimal lucency about the right greater than left L3 pedicle screws and about the left greater than right L5 pedicle screws. A small amount of bridging bone extends along the lateral aspect of the posterolateral fusion from L3-L5, however this does not appear robust. A small amount of contrast material within the posterior soft tissues at L3-4 is attributed to the injection. The conus medullaris terminates at L2. There is  new clumping of the nerve roots of the cauda equina, most notable on the left at L4. The nerve roots are located posteriorly in the thecal sac at the L4 level with mild loculation of contrast which may reflect intradural adhesions. There is peripheral displacement of the cauda equina nerve roots at L5 and in the sacrum. Diffuse abdominal aortic atherosclerosis is noted. T12-L1:  Negative. L1-2: New minimal vacuum disc. Circumferential disc bulging without stenosis, unchanged. L2-3: New mild vacuum disc and progressive retrolisthesis. Prior posterior decompression. Interval partial left facetectomy. Circumferential disc bulging and spurring result  in new mild right neural foraminal stenosis and moderate residual left neural foraminal stenosis. Spinal canal is decompressed. L3-4: Prior laminectomies with interval facetectomies and posterior fusion. Circumferential disc bulging asymmetric to the left results in mild residual left neural foraminal stenosis. No spinal or right neural foraminal stenosis. L4-5: Prior laminectomies with interval facetectomies and posterior fusion. No residual stenosis. L5-S1: New minimal vacuum disc. Advanced facet arthrosis with slight anterolisthesis and slight disc uncovering without stenosis. IMPRESSION: 1. Interval decompression and fusion changes L3-L5 as above. Minimal lucency about the L3 and L5 screws. 2. New findings of arachnoiditis. 3. Progressive disc degeneration at L2-3 and with increased retrolisthesis and mild right and moderate left neural foraminal stenosis. 4. Increased anterolisthesis at new anterolisthesis at L3-4 with mild left neural foraminal stenosis. 5. Aortic atherosclerosis. Electronically Signed   By: Logan Bores M.D.   On: 04/11/2016 15:49   Dg Chest Portable 1 View  Result Date: 04/15/2016 CLINICAL DATA:  SOB, Pt here with URI which began Wednesday. Pt has been having coughing with this and has felt like her heart was racing and has been sob. Pt states  that she has not been able to ambulate due to sob and he heart racingHISTORY OF CHF, MI, HTN, COPD, PNEUMONIA EXAM: PORTABLE CHEST 1 VIEW COMPARISON:  03/20/2016 FINDINGS: Cardiac silhouette is mildly enlarged. No convincing mediastinal or hilar masses. There are irregularly thickened interstitial markings bilaterally mildly increased when compared to a chest radiograph dated 10/15/2015, but similar to the most recent prior exam. Findings may all be chronic. A mild degree of interstitial edema is suspected given the history of shortness of breath. No lung consolidation is seen to suggest pneumonia. No convincing pleural effusion. No pneumothorax. Skeletal structures are demineralized. There are old healed left rib fractures. IMPRESSION: 1. Cardiomegaly with interstitial thickening. Suspect mild congestive heart failure superimposed on chronic interstitial changes. No evidence of pneumonia. Electronically Signed   By: Lajean Manes M.D.   On: 04/15/2016 15:19   Dg Myelography Lumbar Inj Lumbosacral  Result Date: 04/11/2016 CLINICAL DATA:  Predominantly left lower extremity pain including in the anterior thigh. Low back pain and some posterior right lower extremity pain as well. Prior lumbar fusion. EXAM: LUMBAR MYELOGRAM FLUOROSCOPY TIME:  Radiation Exposure Index (as provided by the fluoroscopic device): 255.37 microGray*m^2 Fluoroscopy Time (in minutes and seconds):  16 seconds PROCEDURE: After thorough discussion of risks and benefits of the procedure including bleeding, infection, injury to nerves, blood vessels, adjacent structures as well as headache and CSF leak, written and oral informed consent was obtained. Consent was obtained by Dr. Logan Bores. Time out form was completed. Patient was positioned prone on the fluoroscopy table. Local anesthesia was provided with 1% lidocaine without epinephrine after prepped and draped in the usual sterile fashion. Puncture was performed at L3 using a 5 inch 22-gauge  spinal needle via a midline approach. Using a single pass through the dura, the needle was placed within the thecal sac, with return of clear CSF. 15 mL of Isovue M 200 was injected into the thecal sac, with normal opacification of the nerve roots and cauda equina consistent with free flow within the subarachnoid space. I personally performed the lumbar puncture and administered the intrathecal contrast. I also personally supervised acquisition of the myelogram images. TECHNIQUE: Contiguous axial images were obtained through the Lumbar spine after the intrathecal infusion of infusion. Coronal and sagittal reconstructions were obtained of the axial image sets. COMPARISON:  Lumbar myelogram 08/25/2015 FINDINGS: LUMBAR MYELOGRAM  FINDINGS: There is grade 1 retrolisthesis of L2 on L3 and grade 1 anterolisthesis of L3 on L4 without significant change during flexion or extension. Listhesis at these levels has mildly progressed. Trace anterolisthesis of L4 on L5 is unchanged. Sequelae of interval L3-L5 posterior fusion are identified. Small ventral extradural defects are present at L1-2 and L2-3. The thecal sac appears widely patent throughout the lumbar spine without evidence of spinal stenosis or nerve root cut off. Abdominal aortic atherosclerosis is noted. CT LUMBAR MYELOGRAM FINDINGS: There is progressive grade 1 anterolisthesis of L2 on L3 which currently measures 5 mm (Previously 1-2 mm). Anterolisthesis of L3 on L4 is new and measures 2-3 mm. There is trace anterolisthesis of L4 on L5 and L5 on S1, unchanged. L3-L5 posterior fusion has been performed in the interim. There is minimal lucency about the right greater than left L3 pedicle screws and about the left greater than right L5 pedicle screws. A small amount of bridging bone extends along the lateral aspect of the posterolateral fusion from L3-L5, however this does not appear robust. A small amount of contrast material within the posterior soft tissues at L3-4  is attributed to the injection. The conus medullaris terminates at L2. There is new clumping of the nerve roots of the cauda equina, most notable on the left at L4. The nerve roots are located posteriorly in the thecal sac at the L4 level with mild loculation of contrast which may reflect intradural adhesions. There is peripheral displacement of the cauda equina nerve roots at L5 and in the sacrum. Diffuse abdominal aortic atherosclerosis is noted. T12-L1:  Negative. L1-2: New minimal vacuum disc. Circumferential disc bulging without stenosis, unchanged. L2-3: New mild vacuum disc and progressive retrolisthesis. Prior posterior decompression. Interval partial left facetectomy. Circumferential disc bulging and spurring result in new mild right neural foraminal stenosis and moderate residual left neural foraminal stenosis. Spinal canal is decompressed. L3-4: Prior laminectomies with interval facetectomies and posterior fusion. Circumferential disc bulging asymmetric to the left results in mild residual left neural foraminal stenosis. No spinal or right neural foraminal stenosis. L4-5: Prior laminectomies with interval facetectomies and posterior fusion. No residual stenosis. L5-S1: New minimal vacuum disc. Advanced facet arthrosis with slight anterolisthesis and slight disc uncovering without stenosis. IMPRESSION: 1. Interval decompression and fusion changes L3-L5 as above. Minimal lucency about the L3 and L5 screws. 2. New findings of arachnoiditis. 3. Progressive disc degeneration at L2-3 and with increased retrolisthesis and mild right and moderate left neural foraminal stenosis. 4. Increased anterolisthesis at new anterolisthesis at L3-4 with mild left neural foraminal stenosis. 5. Aortic atherosclerosis. Electronically Signed   By: Logan Bores M.D.   On: 04/11/2016 15:49    Micro Results     Recent Results (from the past 240 hour(s))  MRSA PCR Screening     Status: None   Collection Time: 04/15/16  7:00  PM  Result Value Ref Range Status   MRSA by PCR NEGATIVE NEGATIVE Final    Comment:        The GeneXpert MRSA Assay (FDA approved for NASAL specimens only), is one component of a comprehensive MRSA colonization surveillance program. It is not intended to diagnose MRSA infection nor to guide or monitor treatment for MRSA infections.     Today   Subjective    [name] today has no headache,no chest abdominal pain,no new weakness tingling or numbness, feels much better wants to go home today.     Objective   Blood pressure 124/90,  pulse 79, temperature 97.6 F (36.4 C), temperature source Oral, resp. rate 16, height 5\' 1"  (1.549 m), weight 75.1 kg (165 lb 8 oz), SpO2 95 %.   Intake/Output Summary (Last 24 hours) at 04/18/16 0853 Last data filed at 04/18/16 0200  Gross per 24 hour  Intake          2282.92 ml  Output                0 ml  Net          2282.92 ml    Exam Awake Alert, Oriented x 3, No new F.N deficits, Normal affect [place].AT,PERRAL Supple Neck,No JVD, No cervical lymphadenopathy appriciated.  Symmetrical Chest wall movement, Good air movement bilaterally, CTAB RRR,No Gallops,Rubs or new Murmurs, No Parasternal Heave +ve B.Sounds, Abd Soft, Non tender, No organomegaly appriciated, No rebound -guarding or rigidity. No Cyanosis, Clubbing or edema, No new Rash or bruise   Data Review   CBC w Diff: Lab Results  Component Value Date   WBC 10.9 (H) 04/15/2016   HGB 10.2 (L) 04/15/2016   HCT 31.6 (L) 04/15/2016   PLT 383 04/15/2016   LYMPHOPCT 13 04/15/2016   MONOPCT 9 04/15/2016   EOSPCT 1 04/15/2016   BASOPCT 0 04/15/2016    CMP: Lab Results  Component Value Date   NA 130 (L) 04/18/2016   K 5.3 (H) 04/18/2016   CL 99 (L) 04/18/2016   CO2 20 (L) 04/18/2016   BUN 58 (H) 04/18/2016   CREATININE 2.50 (H) 04/18/2016   CREATININE 1.17 (H) 02/13/2011   PROT 7.8 04/15/2016   ALBUMIN 3.6 04/15/2016   BILITOT 1.0 04/15/2016   ALKPHOS 118 04/15/2016    AST 21 04/15/2016   ALT 15 04/15/2016  .   Total Time in preparing paper work, data evaluation and todays exam - 35 minutes  Thurnell Lose M.D on 04/18/2016 at 8:53 AM  [facility]   Office  [phone]

## 2016-04-18 NOTE — Progress Notes (Signed)
SATURATION QUALIFICATIONS: (This note is used to comply with regulatory documentation for home oxygen)  Patient Saturations on Room Air at Rest = 99%  Patient Saturations on Room Air while Ambulating = 97%  Patient became SOB while ambulating but oxygen sats remained stable. SOB resolved with rest.

## 2016-04-19 ENCOUNTER — Ambulatory Visit: Payer: Medicare Other | Admitting: Nurse Practitioner

## 2016-04-19 DIAGNOSIS — I25119 Atherosclerotic heart disease of native coronary artery with unspecified angina pectoris: Secondary | ICD-10-CM | POA: Diagnosis not present

## 2016-04-19 DIAGNOSIS — I480 Paroxysmal atrial fibrillation: Secondary | ICD-10-CM | POA: Diagnosis not present

## 2016-04-19 DIAGNOSIS — I5031 Acute diastolic (congestive) heart failure: Secondary | ICD-10-CM | POA: Diagnosis not present

## 2016-04-19 DIAGNOSIS — J441 Chronic obstructive pulmonary disease with (acute) exacerbation: Secondary | ICD-10-CM | POA: Diagnosis not present

## 2016-04-19 DIAGNOSIS — I130 Hypertensive heart and chronic kidney disease with heart failure and stage 1 through stage 4 chronic kidney disease, or unspecified chronic kidney disease: Secondary | ICD-10-CM | POA: Diagnosis not present

## 2016-04-19 DIAGNOSIS — N183 Chronic kidney disease, stage 3 (moderate): Secondary | ICD-10-CM | POA: Diagnosis not present

## 2016-04-20 ENCOUNTER — Other Ambulatory Visit: Payer: Self-pay | Admitting: *Deleted

## 2016-04-20 DIAGNOSIS — M4316 Spondylolisthesis, lumbar region: Secondary | ICD-10-CM | POA: Diagnosis not present

## 2016-04-20 DIAGNOSIS — M5416 Radiculopathy, lumbar region: Secondary | ICD-10-CM | POA: Diagnosis not present

## 2016-04-20 DIAGNOSIS — Z6830 Body mass index (BMI) 30.0-30.9, adult: Secondary | ICD-10-CM | POA: Diagnosis not present

## 2016-04-20 NOTE — Patient Outreach (Addendum)
[name]) Care Management  04/20/2016  [name] [birth year] [id number]  [name] an 83 y.o.femalewith a history of paroxysmal atrial fibrillation,  hypertension, hyperlipidemia, coronary artery disease status post stent, and CHF. She was admitted to the hospital on 04/15/16 with afib with rapid ventricular rate and acute/chronic diastolic heart failure. [name] was discharged to home and the care of her 2 daughters from the hospital on 04/18/16. She is active with [facility] Care management. I called the home of [name] today to perform a transition of care assessment.   I spoke with [name] daughter [name] (consent received by [name] on 02/17/16) who told me that [name] was with her sister at an orthopedist appointment. [name] and her sister have been providing care for [name] at home since her hospital discharge.    Hospital Follow Up - [name] confirmed that [name] is scheduled to see [name], [facility]) on 04/27/16 @ 1pm for a post hospital follow up appointment. I reached out to the office of Tonya Carnes PA-C @ [facility] in [place] [phone]) and left a message with scheduling requesting a call back re: a post hospital primary care follow up appointment.   Medication management - [name] was unable to review individual medications with me, stating her sister was helping [name] with medications. She did state that she witnessed [name] take her morning medications this morning as she ([name]) was coming in from working night shift. She stated that to her knowledge, [name] had received all prescribed medications on the same day as discharge and has been taking them with the assistance/oversight of her daughter since discharge.   CHF Disease Management - [name] said that she knew her mother was weighing herself each morning and recording but she didn't know where the  information was being kept. To her knowledge, [name] has been asymptomatic since discharge from the hospital.   Plan: I will update Tonya Larsen RN, primary care manager for [name]. Mrs. [name] daughter will notify her that [name] will return a call to her to schedule a face to face visit.    [facility]  [phone]

## 2016-04-21 ENCOUNTER — Other Ambulatory Visit: Payer: Self-pay | Admitting: *Deleted

## 2016-04-21 DIAGNOSIS — J441 Chronic obstructive pulmonary disease with (acute) exacerbation: Secondary | ICD-10-CM | POA: Diagnosis not present

## 2016-04-21 DIAGNOSIS — R32 Unspecified urinary incontinence: Secondary | ICD-10-CM | POA: Diagnosis not present

## 2016-04-21 DIAGNOSIS — I5031 Acute diastolic (congestive) heart failure: Secondary | ICD-10-CM | POA: Diagnosis not present

## 2016-04-21 DIAGNOSIS — I480 Paroxysmal atrial fibrillation: Secondary | ICD-10-CM | POA: Diagnosis not present

## 2016-04-21 DIAGNOSIS — D649 Anemia, unspecified: Secondary | ICD-10-CM | POA: Diagnosis not present

## 2016-04-21 DIAGNOSIS — I130 Hypertensive heart and chronic kidney disease with heart failure and stage 1 through stage 4 chronic kidney disease, or unspecified chronic kidney disease: Secondary | ICD-10-CM | POA: Diagnosis not present

## 2016-04-21 DIAGNOSIS — I25119 Atherosclerotic heart disease of native coronary artery with unspecified angina pectoris: Secondary | ICD-10-CM | POA: Diagnosis not present

## 2016-04-21 DIAGNOSIS — F419 Anxiety disorder, unspecified: Secondary | ICD-10-CM | POA: Diagnosis not present

## 2016-04-21 DIAGNOSIS — I5033 Acute on chronic diastolic (congestive) heart failure: Secondary | ICD-10-CM | POA: Diagnosis not present

## 2016-04-21 DIAGNOSIS — Z6829 Body mass index (BMI) 29.0-29.9, adult: Secondary | ICD-10-CM | POA: Diagnosis not present

## 2016-04-21 DIAGNOSIS — M545 Low back pain: Secondary | ICD-10-CM | POA: Diagnosis not present

## 2016-04-21 DIAGNOSIS — N183 Chronic kidney disease, stage 3 (moderate): Secondary | ICD-10-CM | POA: Diagnosis not present

## 2016-04-21 DIAGNOSIS — I482 Chronic atrial fibrillation: Secondary | ICD-10-CM | POA: Diagnosis not present

## 2016-04-21 NOTE — Patient Outreach (Addendum)
Telephone call to patient , spoke with patient's son (lives with pt) , HIPAA verified, pt son states pt is asleep, RN CM scheduled a home visit for 04/28/16 and informed that pt does have appointment with primary MD at [place] on 04/24/16 per Duane Lope at [place], patient's son wrote on calendar and will call to verify the time.   PLAN Continue weekly transition of care See pt for home visit next week  Jacqlyn Larsen [facility], [place] Coordinator [phone]

## 2016-04-22 DIAGNOSIS — N183 Chronic kidney disease, stage 3 (moderate): Secondary | ICD-10-CM | POA: Diagnosis not present

## 2016-04-22 DIAGNOSIS — I739 Peripheral vascular disease, unspecified: Secondary | ICD-10-CM | POA: Diagnosis not present

## 2016-04-22 DIAGNOSIS — Z7982 Long term (current) use of aspirin: Secondary | ICD-10-CM | POA: Diagnosis not present

## 2016-04-22 DIAGNOSIS — I130 Hypertensive heart and chronic kidney disease with heart failure and stage 1 through stage 4 chronic kidney disease, or unspecified chronic kidney disease: Secondary | ICD-10-CM | POA: Diagnosis not present

## 2016-04-22 DIAGNOSIS — I5031 Acute diastolic (congestive) heart failure: Secondary | ICD-10-CM | POA: Diagnosis not present

## 2016-04-22 DIAGNOSIS — K219 Gastro-esophageal reflux disease without esophagitis: Secondary | ICD-10-CM | POA: Diagnosis not present

## 2016-04-22 DIAGNOSIS — F329 Major depressive disorder, single episode, unspecified: Secondary | ICD-10-CM | POA: Diagnosis not present

## 2016-04-22 DIAGNOSIS — F419 Anxiety disorder, unspecified: Secondary | ICD-10-CM | POA: Diagnosis not present

## 2016-04-22 DIAGNOSIS — G8929 Other chronic pain: Secondary | ICD-10-CM | POA: Diagnosis not present

## 2016-04-22 DIAGNOSIS — I25119 Atherosclerotic heart disease of native coronary artery with unspecified angina pectoris: Secondary | ICD-10-CM | POA: Diagnosis not present

## 2016-04-22 DIAGNOSIS — J441 Chronic obstructive pulmonary disease with (acute) exacerbation: Secondary | ICD-10-CM | POA: Diagnosis not present

## 2016-04-22 DIAGNOSIS — I480 Paroxysmal atrial fibrillation: Secondary | ICD-10-CM | POA: Diagnosis not present

## 2016-04-23 ENCOUNTER — Emergency Department (HOSPITAL_COMMUNITY): Payer: Medicare Other

## 2016-04-23 ENCOUNTER — Encounter (HOSPITAL_COMMUNITY): Payer: Self-pay

## 2016-04-23 ENCOUNTER — Emergency Department (HOSPITAL_COMMUNITY)
Admission: EM | Admit: 2016-04-23 | Discharge: 2016-04-23 | Disposition: A | Discharge status: Home / Self Care | Payer: Medicare Other | Attending: Emergency Medicine | Admitting: Emergency Medicine

## 2016-04-23 DIAGNOSIS — R21 Rash and other nonspecific skin eruption: Secondary | ICD-10-CM | POA: Insufficient documentation

## 2016-04-23 DIAGNOSIS — Z955 Presence of coronary angioplasty implant and graft: Secondary | ICD-10-CM | POA: Diagnosis not present

## 2016-04-23 DIAGNOSIS — N183 Chronic kidney disease, stage 3 (moderate): Secondary | ICD-10-CM | POA: Diagnosis not present

## 2016-04-23 DIAGNOSIS — Z87891 Personal history of nicotine dependence: Secondary | ICD-10-CM | POA: Diagnosis not present

## 2016-04-23 DIAGNOSIS — I130 Hypertensive heart and chronic kidney disease with heart failure and stage 1 through stage 4 chronic kidney disease, or unspecified chronic kidney disease: Secondary | ICD-10-CM | POA: Insufficient documentation

## 2016-04-23 DIAGNOSIS — R0602 Shortness of breath: Secondary | ICD-10-CM | POA: Diagnosis not present

## 2016-04-23 DIAGNOSIS — I2510 Atherosclerotic heart disease of native coronary artery without angina pectoris: Secondary | ICD-10-CM | POA: Diagnosis not present

## 2016-04-23 DIAGNOSIS — J441 Chronic obstructive pulmonary disease with (acute) exacerbation: Secondary | ICD-10-CM | POA: Diagnosis not present

## 2016-04-23 DIAGNOSIS — I5043 Acute on chronic combined systolic (congestive) and diastolic (congestive) heart failure: Secondary | ICD-10-CM | POA: Insufficient documentation

## 2016-04-23 DIAGNOSIS — G4489 Other headache syndrome: Secondary | ICD-10-CM | POA: Diagnosis not present

## 2016-04-23 DIAGNOSIS — E860 Dehydration: Secondary | ICD-10-CM | POA: Insufficient documentation

## 2016-04-23 DIAGNOSIS — R404 Transient alteration of awareness: Secondary | ICD-10-CM | POA: Diagnosis not present

## 2016-04-23 DIAGNOSIS — R51 Headache: Secondary | ICD-10-CM | POA: Diagnosis not present

## 2016-04-23 DIAGNOSIS — Z79899 Other long term (current) drug therapy: Secondary | ICD-10-CM | POA: Insufficient documentation

## 2016-04-23 DIAGNOSIS — R519 Headache, unspecified: Secondary | ICD-10-CM

## 2016-04-23 DIAGNOSIS — R531 Weakness: Secondary | ICD-10-CM | POA: Diagnosis not present

## 2016-04-23 LAB — BASIC METABOLIC PANEL
Anion gap: 8 (ref 5–15)
BUN: 23 mg/dL — AB (ref 6–20)
CALCIUM: 8.5 mg/dL — AB (ref 8.9–10.3)
CHLORIDE: 95 mmol/L — AB (ref 101–111)
CO2: 30 mmol/L (ref 22–32)
Creatinine, Ser: 1.3 mg/dL — ABNORMAL HIGH (ref 0.44–1.00)
GFR calc non Af Amer: 37 mL/min — ABNORMAL LOW (ref >60)
GFR, EST AFRICAN AMERICAN: 43 mL/min — AB (ref >60)
Glucose, Bld: 115 mg/dL — ABNORMAL HIGH (ref 65–99)
Potassium: 3.6 mmol/L (ref 3.5–5.1)
SODIUM: 133 mmol/L — AB (ref 135–145)

## 2016-04-23 LAB — CBC WITH DIFFERENTIAL/PLATELET
BASOS PCT: 0 %
Basophils Absolute: 0 10*3/uL (ref 0.0–0.1)
EOS ABS: 0.3 10*3/uL (ref 0.0–0.7)
EOS PCT: 2 %
HCT: 35.1 % — ABNORMAL LOW (ref 36.0–46.0)
HEMOGLOBIN: 11.3 g/dL — AB (ref 12.0–15.0)
LYMPHS ABS: 1.9 10*3/uL (ref 0.7–4.0)
Lymphocytes Relative: 13 %
MCH: 24.8 pg — ABNORMAL LOW (ref 26.0–34.0)
MCHC: 32.2 g/dL (ref 30.0–36.0)
MCV: 77 fL — ABNORMAL LOW (ref 78.0–100.0)
MONO ABS: 1.3 10*3/uL — AB (ref 0.1–1.0)
MONOS PCT: 9 %
Neutro Abs: 11.2 10*3/uL — ABNORMAL HIGH (ref 1.7–7.7)
Neutrophils Relative %: 76 %
PLATELETS: 447 10*3/uL — AB (ref 150–400)
RBC: 4.56 MIL/uL (ref 3.87–5.11)
RDW: 15.9 % — AB (ref 11.5–15.5)
WBC: 14.6 10*3/uL — ABNORMAL HIGH (ref 4.0–10.5)

## 2016-04-23 MED ORDER — SODIUM CHLORIDE 0.9 % IV BOLUS (SEPSIS)
500.0000 mL | Freq: Once | INTRAVENOUS | Status: AC
  Administered 2016-04-23: 500 mL via INTRAVENOUS

## 2016-04-23 MED ORDER — ACETAMINOPHEN 500 MG PO TABS
1000.0000 mg | ORAL_TABLET | Freq: Once | ORAL | Status: AC
  Administered 2016-04-23: 1000 mg via ORAL
  Filled 2016-04-23: qty 2

## 2016-04-23 NOTE — ED Notes (Signed)
Pt ambulated well with no complaints.

## 2016-04-23 NOTE — ED Provider Notes (Addendum)
[facility] DEPT Provider Note   CSN: [id number] Arrival date & time: 04/23/16  1100  By signing my name below, I, [name], attest that this documentation has been prepared under the direction and in the presence of Fatima Blank, MD. Electronically Signed: Hilbert [name], Scribe. 04/23/16. 11:33 AM. History   Chief Complaint Chief Complaint  Patient presents with  . Headache     The history is provided by the patient. No language interpreter was used.   HPI Comments: [name] is a 83 y.o. female brought in by ambulance, who presents to the Emergency Department complaining of severe frontal headache that began 2 days ago. She describes this headache as "pounding". This headache was mild at first and has progressively worsened. She reports that last night the headache was very severe. She began to shake at that time. Patient reports that she did not take any medications to alleviate this pain. She states that she has had a headache in the past but it was not this severe. She denies coughing, fever, and congestion. She reports chronic SOB that is at baseline. Patient also reports that she got out bed around 5 am this morning and  fell onto the couch on her way to the bathroom. Denies LOC, head trauma, or acute injuries. She states that she felt weakness in her legs at that time. She states that she has a hx of bilateral leg weakness. She denies head injury and dizziness. She denies hx of smoking.  She has a hx of A-fib which she is on Eliquis. She is compliant with all of her medications. She was recently admitted for CHF exacerbation and is currently taking diuretic. She has had decreased PO intake in the last few days per family.    Past Medical History:  Diagnosis Date  . Anxiety   . Arthritis   . Bursitis    left shoulder  . Cataract   . CHF (congestive heart failure) (HCC)    EF 55-60%  . Complication of anesthesia   . COPD (chronic obstructive  pulmonary disease) ([place])   . Coronary atherosclerosis of native coronary artery    a. DES to CX, moderately severe stenosis RCA, mild stenosis LAD 04/2013  . Depression   . Dysphagia, unspecified(787.20)   . GERD (gastroesophageal reflux disease)    Hx Schatzki's ring, multiple EGD/ED last 01/06/2004  . Headache   . Heart disease   . Heart murmur    'a littel'  . HTN (hypertension)    Hx of it  . Hyperlipemia   . Hyperlipidemia   . Internal hemorrhoids without mention of complication   . MI (myocardial infarction) 2006  . Microscopic colitis 2003  . Other and unspecified hyperlipidemia   . Panic disorder without agoraphobia   . Paresthesia    hands, feet  . Pneumonia 12/2011  . PONV (postoperative nausea and vomiting)    'a little nausea"  . PVD (peripheral vascular disease) ([place])   . S/P colonoscopy 09/27/2001   internal hemorrhoids, desc colon inflam polyp, SB BX-chronic duodenitis, colitis  . Shortness of breath   . Sleep disorder    obstructive  . Thyroid disease    recent abnl TSH per pt    Patient Active Problem List   Diagnosis Date Noted  . Atrial fibrillation with RVR ([place]) 04/15/2016  . SOB (shortness of breath) 03/20/2016  . Hypoxia   . Acute on chronic diastolic congestive heart failure ([place])   . Atrial fibrillation ([place])   .  Chronic kidney disease, stage III (moderate) 02/17/2016  . Essential hypertension 02/17/2016  . COPD exacerbation ([place]) 02/17/2016  . Acute bronchitis 12/18/2015  . CAD in native artery   . Chest pain at rest 10/15/2015  . Chronic low back pain 10/15/2015  . Hyponatremia 10/15/2015  . Hyperglycemia 10/15/2015  . Thrombocytosis ([place]) 10/15/2015  . Atypical chest pain 10/15/2015  . Depression   . Anxiety   . Gastroesophageal reflux disease without esophagitis   . Mild cognitive impairment 10/14/2015  . Iron deficiency anemia due to chronic blood loss   . Meningitis 10/01/2015  . Coronary artery disease due to lipid rich plaque     . NSVT (nonsustained ventricular tachycardia) ([place])   . PAF (paroxysmal atrial fibrillation) ([place])   . CSF leak 09/27/2015  . Spondylolisthesis of lumbar region 09/24/2015  . Gastric polyp   . History of colonic polyps   . Chronic diarrhea   . Diarrhea 04/01/2015  . Esophageal dysphagia 04/01/2015  . PAOD (peripheral arterial occlusive disease) ([place]) 03/05/2015  . Pain in the chest   . Hyperlipidemia   . Weight gain 08/12/2014  . Normocytic anemia 08/07/2014  . Hemorrhoids 08/06/2014  . Diastolic CHF, acute on chronic (HCC) 07/30/2014  . CHF (congestive heart failure) ([place]) 07/29/2014  . Rectal bleeding 07/06/2014  . Constipation 07/06/2014  . Lower extremity edema 11/24/2013  . Acute kidney failure ([place]) 11/24/2013  . Hypokalemia 11/24/2013  . Other and unspecified angina pectoris 05/14/2013  . Hematochezia 02/13/2011  . Abdominal pain 02/13/2011  . Major depression ([place]) 09/28/2010  . FATIGUE 04/13/2009  . Chronic diastolic heart failure ([place]) 11/30/2008  . DYSPNEA 11/30/2008  . HYPERLIPIDEMIA-MIXED 11/27/2008  . CAD, NATIVE VESSEL - PCI + DES to left circumflex 05/14/13 11/27/2008  . Peripheral vascular disease ([place]) 11/27/2008  . PANIC ATTACK 02/28/2008  . MI 02/28/2008  . Internal hemorrhoids 02/28/2008  . GASTROESOPHAGEAL REFLUX DISEASE, CHRONIC 02/28/2008  . COLITIS 02/28/2008  . Dysphagia 02/28/2008    Past Surgical History:  Procedure Laterality Date  . ABDOMINAL HYSTERECTOMY    . ANTERIOR AND POSTERIOR REPAIR     with resection of vagina  . APPENDECTOMY    . BACK SURGERY    . BIOPSY  07/05/2015   Procedure: BIOPSY;  Surgeon: Daneil Dolin, MD;  [facility] ENDO SUITE;  Service: Endoscopy;;  gastric polyp biopsy, ascending colon biopsy  . BLADDER SUSPENSION  11/09/2011   Procedure: TRANSVAGINAL TAPE (TVT) PROCEDURE;  Surgeon: Marissa Nestle, MD;  Location: [facility] ORS;  Service: Urology;  Laterality: N/A;  . bladder tack  06/2010  . BREAST LUMPECTOMY  1998    left, benign  . CARDIAC CATHETERIZATION    . CARDIAC CATHETERIZATION    . CARDIAC CATHETERIZATION N/A 12/16/2015   Procedure: Left Heart Cath and Coronary Angiography;  Surgeon: Troy Sine, MD;  Location: [facility] CV LAB;  Service: Cardiovascular;  Laterality: N/A;  . [place]   left  . CHOLECYSTECTOMY  1998  . COLONOSCOPY  03/16/2011   multiple hyperplastic colon polyps, sigmoid diverticulosis, melanosis coli  . COLONOSCOPY WITH PROPOFOL N/A 07/05/2015   RMR:one 5 mm polyp in descending colon  . CORONARY ANGIOPLASTY WITH STENT PLACEMENT    . ESOPHAGEAL DILATION N/A 07/05/2015   Procedure: ESOPHAGEAL DILATION;  Surgeon: Daneil Dolin, MD;  Location: [facility] ENDO [facility];  Service: Endoscopy;  Laterality: N/A;  . ESOPHAGOGASTRODUODENOSCOPY (EGD) WITH PROPOFOL N/A 07/05/2015   [id number]  . JOINT REPLACEMENT Right 2007  .  left hand surgery    . LEFT HEART CATHETERIZATION WITH CORONARY ANGIOGRAM N/A 05/14/2013   Procedure: LEFT HEART CATHETERIZATION WITH CORONARY ANGIOGRAM;  Surgeon: Blane Ohara, MD;  Location: [facility] CATH LAB;  Service: Cardiovascular;  Laterality: N/A;  . left rotator cuff surgery    . LUMBAR LAMINECTOMY/DECOMPRESSION MICRODISCECTOMY N/A 10/11/2012   Procedure: LUMBAR LAMINECTOMY/DECOMPRESSION MICRODISCECTOMY 2 LEVELS;  Surgeon: Floyce Stakes, MD;  Location: [facility] NEURO ORS;  Service: Neurosurgery;  Laterality: N/A;  L3-4 L4-5 Laminectomy  . LUMBAR WOUND DEBRIDEMENT N/A 09/27/2015   Procedure: Exploration of Lumbar Wound w/ Repair CSF Leak/Lumbar Drain Placement;  Surgeon: Leeroy Cha, MD;  Location: [facility] NEURO ORS;  Service: Neurosurgery;  Laterality: N/A;  . MALONEY DILATION  03/16/2011   Gastritis. No H.pylori on bx. 68F maloney dilation with disruption of  occult cevical esophageal web  . NASAL SINUS SURGERY    . right knee replacement  2007  . right leg benign tumor    . SHOULDER SURGERY Left   . TONSILLECTOMY    . unspecified area, hysterectomy   1972   partial    OB History    Gravida Para Term Preterm AB Living   8 6     2 5    SAB TAB Ectopic Multiple Live Births                  Home Medications    Prior to Admission medications   Medication Sig Start Date End Date Taking? Authorizing Provider  acetaminophen (TYLENOL) 500 MG tablet Take 500 mg by mouth daily as needed for headache.    Yes Historical Provider, MD  ALPRAZolam Duanne Moron) 0.25 MG tablet Take 0.25 mg by mouth at bedtime as needed for anxiety.   Yes Historical Provider, MD  apixaban (ELIQUIS) 5 MG TABS tablet Take 1 tablet (5 mg total) by mouth 2 (two) times daily. 03/24/16  Yes Herminio Commons, MD  carvedilol (COREG) 25 MG tablet Take 12.5 mg by mouth 2 (two) times daily with a meal.    Yes Historical Provider, MD  diltiazem (CARDIZEM CD) 240 MG 24 hr capsule Take 1 capsule (240 mg total) by mouth daily. 02/21/16  Yes Kathie Dike, MD  DULoxetine (CYMBALTA) 60 MG capsule Take 60 mg by mouth at bedtime.    Yes Historical Provider, MD  fluticasone (FLONASE) 50 MCG/ACT nasal spray Place 2 sprays into both nostrils daily.    Yes Historical Provider, MD  furosemide (LASIX) 20 MG tablet Take 1 tablet (20 mg total) by mouth daily. 04/20/16  Yes Thurnell Lose, MD  gabapentin (NEURONTIN) 300 MG capsule Take 1 capsule (300 mg total) by mouth 3 (three) times daily. 10/08/15  Yes Kevan Ny Ditty, MD  ipratropium-albuterol (DUONEB) 0.5-2.5 (3) MG/3ML SOLN Take 3 mLs by nebulization every 6 (six) hours. 12/18/15  Yes Belkys A Regalado, MD  Melatonin 3 MG CAPS Take 1 capsule by mouth at bedtime.   Yes Historical Provider, MD  pantoprazole (PROTONIX) 40 MG tablet Take 40 mg by mouth every morning.    Yes Historical Provider, MD  bisacodyl (DULCOLAX) 5 MG EC tablet Take 1 tablet (5 mg total) by mouth daily as needed for moderate constipation. 04/18/16   Thurnell Lose, MD  hydrocortisone (ANUSOL-HC) 2.5 % rectal cream Place 1 application rectally 2 (two) times daily. 03/01/16    Annitta Needs, NP  nitroGLYCERIN (NITROSTAT) 0.4 MG SL tablet Place 0.4 mg under the tongue every 5 (five) minutes as needed for chest  pain. Reported on 08/04/2015    Historical Provider, MD    Family History Family History  Problem Relation Age of Onset  . Stroke Mother   . Parkinson's disease Father   . Coronary artery disease Other     family Hx-sons  . Cancer Other   . Stroke Other     family Hx  . Hypertension Other     family Hx  . Diabetes Brother   . Heart disease Son     before age 52  . Diabetes Son   . Stroke Daughter 13    Social History Social History  Substance Use Topics  . Smoking status: Former Smoker    Packs/day: 1.00    Years: 64.00    Types: Cigarettes    Start date: 12/24/1947    Quit date: 11/17/2001  . Smokeless tobacco: Never Used     Comment: Quit smoking in 2003  . Alcohol use No     Allergies   Cephalosporins; Levaquin [levofloxacin in d5w]; Macrodantin [nitrofurantoin macrocrystal]; Phenothiazines; Polysorbate; Prednisone; Buspirone; Cardura [doxazosin mesylate]; Codeine; Acyclovir and related; Prochlorperazine; Ranexa [ranolazine]; Atorvastatin; Ofloxacin; Other; Penicillins; and Pimozide   Review of Systems Review of Systems A complete 10 system review of systems was obtained and all systems are negative except as noted in the HPI and PMH.    Physical Exam Updated Vital Signs BP 119/55 (BP Location: Right Arm)   Pulse 101   Temp 97.1 F (36.2 C) (Oral)   Resp 21   Ht 5\' 1"  (1.549 m)   Wt 165 lb (74.8 kg)   SpO2 95%   BMI 31.18 kg/m   Physical Exam  Constitutional: She is oriented to person, place, and time. She appears well-developed and well-nourished. No distress.  HENT:  Head: Normocephalic and atraumatic.  Nose: Nose normal.  Eyes: Conjunctivae and EOM are normal. Pupils are equal, round, and reactive to light. Right eye exhibits no discharge. Left eye exhibits no discharge. No scleral icterus.  Neck: Normal range of  motion. Neck supple.  Cardiovascular: Normal rate and regular rhythm.  Exam reveals no gallop and no friction rub.   No murmur heard. Pulmonary/Chest: Effort normal and breath sounds normal. No stridor. No respiratory distress. She has no rales.  Abdominal: Soft. She exhibits no distension. There is no tenderness.  Musculoskeletal: She exhibits no edema or tenderness.  Neurological: She is alert and oriented to person, place, and time.  Mental Status: Alert and oriented to person, place, and time. Attention and concentration normal. Speech clear. Recent memory is intac  Cranial Nerves  II Visual Fields: Intact to confrontation. Visual fields intact. III, IV, VI: Pupils equal and reactive to light and near. Full eye movement without nystagmus  V Facial Sensation: Normal. No weakness of masticatory muscles  VII: No facial weakness or asymmetry  VIII Auditory Acuity: Grossly normal  IX/X: The uvula is midline; the palate elevates symmetrically  XI: Normal sternocleidomastoid and trapezius strength  XII: The tongue is midline. No atrophy or fasciculations.   Motor System: Muscle Strength: 5/5 and symmetric in the upper and lower extremities. No pronation or drift.  Muscle Tone: Tone and muscle bulk are normal in the upper and lower extremities.   Reflexes: DTRs: 1+ and symmetrical in all four extremities. Plantar responses are flexor bilaterally.  Coordination: Intact finger-to-nose and rapid alternating movements. No tremor.  Sensation: Intact to light touch, and pinprick. Negative Romberg test.  Gait: at pt's baseline (with walker)   Skin: Skin is  warm and dry. Rash (cold sores on upper lip) noted. She is not diaphoretic. No erythema.  Psychiatric: She has a normal mood and affect.  Vitals reviewed.    ED Treatments / Results  DIAGNOSTIC STUDIES: Oxygen Saturation is 95% on RA, adequate by my interpretation.    COORDINATION OF CARE: 11:33 AM Discussed treatment plan with pt at  bedside and pt agreed to plan.  Labs (all labs ordered are listed, but only abnormal results are displayed) Labs Reviewed  CBC WITH DIFFERENTIAL/PLATELET - Abnormal; Notable for the following:       Result Value   WBC 14.6 (*)    Hemoglobin 11.3 (*)    HCT 35.1 (*)    MCV 77.0 (*)    MCH 24.8 (*)    RDW 15.9 (*)    Platelets 447 (*)    Neutro Abs 11.2 (*)    Monocytes Absolute 1.3 (*)    All other components within normal limits  BASIC METABOLIC PANEL - Abnormal; Notable for the following:    Sodium 133 (*)    Chloride 95 (*)    Glucose, Bld 115 (*)    BUN 23 (*)    Creatinine, Ser 1.30 (*)    Calcium 8.5 (*)    GFR calc non Af Amer 37 (*)    GFR calc Af Amer 43 (*)    All other components within normal limits    EKG  EKG Interpretation  Date/Time:  Sunday April 23 2016 11:12:48 EST Ventricular Rate:  136 PR Interval:    QRS Duration: 77 QT Interval:  324 QTC Calculation: 488 R Axis:   -21 Text Interpretation:  Atrial fibrillation Borderline left axis deviation Anteroseptal infarct, old No significant change since last tracing Confirmed by [facility] MD, Gerhard Rappaport [phone]) on 04/23/2016 12:59:11 PM       Radiology Dg Chest 2 View  Result Date: 04/23/2016 CLINICAL DATA:  Shortness of breath.  Headache for 2 days. EXAM: CHEST  2 VIEW COMPARISON:  Single-view of the chest 04/15/2016. PA and lateral chest 03/20/2016. FINDINGS: There is cardiomegaly without edema. No pneumothorax or pleural effusion. No acute bony abnormality. Atherosclerosis is noted. IMPRESSION: Cardiomegaly without acute disease. Atherosclerosis. Electronically Signed   By: Inge Rise M.D.   On: 04/23/2016 12:46    Procedures Procedures (including critical care time)  Medications Ordered in ED Medications  sodium chloride 0.9 % bolus 500 mL (0 mLs Intravenous Stopped 04/23/16 1310)  acetaminophen (TYLENOL) tablet 1,000 mg (1,000 mg Oral Given 04/23/16 1208)     Initial Impression / Assessment and  Plan / ED Course  I have reviewed the triage vital signs and the nursing notes.  Pertinent labs & imaging results that were available during my care of the patient were reviewed by me and considered in my medical decision making (see chart for details).  Clinical Course     Non focal neuro exam. No recent head trauma. No fever. Doubt meningitis. Doubt intracranial bleed as the headache started prior to the fall this am (plus, per pt she fell onto a couch). Doubt IIH. No indication for imaging at this time.   Given the decreased PO intake, diuretic use, and exam findings (dry mucosa and mild tachycardia), I feel that the patient is dehydrated which is contributing to her current symptoms. Will provide small IVF bolus along with oral hydration. Will also provide Tylenol for pain.   CBC with hemoconcentration. Renal function improved from prior.   Headache completely resolved following hydration  and tylenol. Pt was able to ambulate at her baseline. She lives at home with daughter who is there 24 hrs a day with the patient.   Family is inquiring about rehabilitation to which the patient has been against. Since pt has a follow up with PCP, I instructed the family to discuss this with the PCP.  The patient is safe for discharge with strict return precautions.    Final Clinical Impressions(s) / ED Diagnoses   Final diagnoses:  SOB (shortness of breath)  Bad headache  Dehydration   Disposition: Discharge  Condition: Good  I have discussed the results, Dx and Tx plan with the patient and family who expressed understanding and agree(s) with the plan. Discharge instructions discussed at great length. The patient and family was given strict return precautions who verbalized understanding of the instructions. No further questions at time of discharge.    Current Discharge Medication List      Follow Up: Tawni Carnes, PA-C [address] [place] [id number]  In 1 day as  scheduled   I personally performed the services described in this documentation, which was scribed in my presence. The recorded information has been reviewed and is accurate.        Fatima Blank, MD 04/23/16 [phone]

## 2016-04-23 NOTE — ED Triage Notes (Signed)
EMS says pt c/o headache x 2 days.  Reports her daughter called ems because she noticed some slurred speech, pt fell, and was shakey.  EMS denies any slurred speech when they arrived.  Reports generalized weakness.  Pt says has been having involuntary tremors since December.  CBG was 147.  Pt c/o severe headache.  Pt also has several cold sores on mouth.

## 2016-04-24 ENCOUNTER — Other Ambulatory Visit: Payer: Self-pay | Admitting: *Deleted

## 2016-04-24 DIAGNOSIS — I5031 Acute diastolic (congestive) heart failure: Secondary | ICD-10-CM | POA: Diagnosis not present

## 2016-04-24 DIAGNOSIS — N183 Chronic kidney disease, stage 3 (moderate): Secondary | ICD-10-CM | POA: Diagnosis not present

## 2016-04-24 DIAGNOSIS — I5033 Acute on chronic diastolic (congestive) heart failure: Secondary | ICD-10-CM | POA: Diagnosis not present

## 2016-04-24 DIAGNOSIS — I482 Chronic atrial fibrillation: Secondary | ICD-10-CM | POA: Diagnosis not present

## 2016-04-24 DIAGNOSIS — I25119 Atherosclerotic heart disease of native coronary artery with unspecified angina pectoris: Secondary | ICD-10-CM | POA: Diagnosis not present

## 2016-04-24 DIAGNOSIS — D649 Anemia, unspecified: Secondary | ICD-10-CM | POA: Diagnosis not present

## 2016-04-24 DIAGNOSIS — Z6828 Body mass index (BMI) 28.0-28.9, adult: Secondary | ICD-10-CM | POA: Diagnosis not present

## 2016-04-24 DIAGNOSIS — B001 Herpesviral vesicular dermatitis: Secondary | ICD-10-CM | POA: Diagnosis not present

## 2016-04-24 DIAGNOSIS — I130 Hypertensive heart and chronic kidney disease with heart failure and stage 1 through stage 4 chronic kidney disease, or unspecified chronic kidney disease: Secondary | ICD-10-CM | POA: Diagnosis not present

## 2016-04-24 DIAGNOSIS — M545 Low back pain: Secondary | ICD-10-CM | POA: Diagnosis not present

## 2016-04-24 DIAGNOSIS — I480 Paroxysmal atrial fibrillation: Secondary | ICD-10-CM | POA: Diagnosis not present

## 2016-04-24 DIAGNOSIS — J441 Chronic obstructive pulmonary disease with (acute) exacerbation: Secondary | ICD-10-CM | POA: Diagnosis not present

## 2016-04-24 DIAGNOSIS — F419 Anxiety disorder, unspecified: Secondary | ICD-10-CM | POA: Diagnosis not present

## 2016-04-24 NOTE — Patient Outreach (Signed)
Received message pt called 24 hour nurse line on 04/23/16 for weakness, dizziness, and a fall with no injury, pt instructed to go to ED, pt did go to ED, received IV fluids and released, pt to follow up with primary care provider today 04/24/16.  Rn CM called pt for follow and no answer to telephone, busy signal received.  PLAN RN CM to see pt for home visit this week  Jacqlyn Larsen [facility], [place] Coordinator [phone]

## 2016-04-25 ENCOUNTER — Ambulatory Visit (INDEPENDENT_AMBULATORY_CARE_PROVIDER_SITE_OTHER): Payer: Medicare Other | Admitting: Urology

## 2016-04-25 DIAGNOSIS — R3912 Poor urinary stream: Secondary | ICD-10-CM | POA: Diagnosis not present

## 2016-04-26 ENCOUNTER — Encounter: Payer: Self-pay | Admitting: Nurse Practitioner

## 2016-04-26 ENCOUNTER — Ambulatory Visit: Payer: Medicare Other | Admitting: Gastroenterology

## 2016-04-26 ENCOUNTER — Ambulatory Visit (INDEPENDENT_AMBULATORY_CARE_PROVIDER_SITE_OTHER): Payer: Medicare Other | Admitting: Nurse Practitioner

## 2016-04-26 VITALS — BP 104/65 | HR 84 | Temp 97.9°F | Ht 61.0 in | Wt 157.0 lb

## 2016-04-26 DIAGNOSIS — J441 Chronic obstructive pulmonary disease with (acute) exacerbation: Secondary | ICD-10-CM | POA: Diagnosis not present

## 2016-04-26 DIAGNOSIS — R110 Nausea: Secondary | ICD-10-CM | POA: Insufficient documentation

## 2016-04-26 DIAGNOSIS — R1084 Generalized abdominal pain: Secondary | ICD-10-CM

## 2016-04-26 DIAGNOSIS — I130 Hypertensive heart and chronic kidney disease with heart failure and stage 1 through stage 4 chronic kidney disease, or unspecified chronic kidney disease: Secondary | ICD-10-CM | POA: Diagnosis not present

## 2016-04-26 DIAGNOSIS — R531 Weakness: Secondary | ICD-10-CM

## 2016-04-26 DIAGNOSIS — K649 Unspecified hemorrhoids: Secondary | ICD-10-CM

## 2016-04-26 DIAGNOSIS — I480 Paroxysmal atrial fibrillation: Secondary | ICD-10-CM | POA: Diagnosis not present

## 2016-04-26 DIAGNOSIS — N183 Chronic kidney disease, stage 3 (moderate): Secondary | ICD-10-CM | POA: Diagnosis not present

## 2016-04-26 DIAGNOSIS — I25119 Atherosclerotic heart disease of native coronary artery with unspecified angina pectoris: Secondary | ICD-10-CM | POA: Diagnosis not present

## 2016-04-26 DIAGNOSIS — I5031 Acute diastolic (congestive) heart failure: Secondary | ICD-10-CM | POA: Diagnosis not present

## 2016-04-26 MED ORDER — HYDROCORTISONE ACETATE 25 MG RE SUPP: 25.0000 mg | Freq: Two times a day (BID) | RECTAL | 1 refills | Status: DC

## 2016-04-26 NOTE — Assessment & Plan Note (Signed)
The patient has intermittent nausea which she does not feel the significant. She declines Zofran at this time. I have recommended she try some ginger ale and saltines as well as other bland foods to help with nausea. Return for follow-up in 4 weeks.

## 2016-04-26 NOTE — Assessment & Plan Note (Signed)
The patient has internal hemorrhoids which she feels are swelling up. She has Anusol rectal cream which she has tried but has not improved her symptoms because her hemorrhoids or internal. I sent in a prescription for Anusol suppositories to try. There are too expensive she can try applying rectal cream internally with a gloved finger versus over-the-counter Preparation H suppositories. Return for follow-up in 4 weeks.

## 2016-04-26 NOTE — Assessment & Plan Note (Signed)
>>  ASSESSMENT AND PLAN FOR HEMORRHOIDS WRITTEN ON 04/26/2016  4:27 PM BY GILL, ERIC A, NP  The patient has internal hemorrhoids which she feels are swelling up. She has Anusol rectal cream which she has tried but has not improved her symptoms because her hemorrhoids or internal. I sent in a prescription for Anusol suppositories to try. There are too expensive she can try applying rectal cream internally with a gloved finger versus over-the-counter Preparation H suppositories. Return for follow-up in 4 weeks.

## 2016-04-26 NOTE — Assessment & Plan Note (Signed)
Significant weakness recently including dehydration. Dehydration likely contributing to her symptoms as well as "catching a bug." She feels like she is improving and heading in the right direction. She is getting stronger and family is encouraging increased by mouth intake. She is eating generally 3 meals a day and taking in plenty of fluids. She is also taking all of her medications. Continue to encourage by mouth intake. Return for follow-up in 4 weeks, call us if any severe or worsening symptoms.

## 2016-04-26 NOTE — Patient Instructions (Signed)
1. I have sent in a prescription for suppositories to your pharmacy. If those are too expensive you can try applying the rectal cream internally with a gloved finger or over-the-counter suppositories. 2. Continue to encourage food and fluids to help regain her strength. 3. If you have worsening nausea you can try ginger ale and saltines or other bland foods. 4. Return for follow-up in 4 weeks. 5. Call us if he has significant worsening or severe symptoms.

## 2016-04-26 NOTE — Assessment & Plan Note (Addendum)
She has some mild lower abdominal pain, no worse with palpation. States it is generally tolerable. Possibly due to stomach bug/viral gastroenteritis which is resulted in her weakness and other symptoms. Continue to monitor, notify of worsening abdominal pain or severe pain. Return for follow-up in 4 weeks.

## 2016-04-26 NOTE — Progress Notes (Signed)
Referring Provider: Tawni Carnes, PA-C Primary Care Physician:  [name] Primary GI:  Dr. Gala Romney  Chief Complaint  Patient presents with  . Abdominal Pain    HPI:   [name] is a 83 y.o. female who presents for follow-up. The patient was last seen in our office 03/01/2016 for constipation. At that time it was noted the patient had an EGD and colonoscopy on file from earlier in 2017. Called complaining of diarrhea and C. difficile PCR was negative. She was told C. difficile was positive as completed by [facility] and diarrhea resolved. Lower abdominal pain continued, hemorrhoid symptoms, general GI discomfort/distress. Hard to have bowel movements. Preparation H did not help, poor appetite. Abdominal pain improved after bowel movement. Bowel movements are nonproductive. She quit taking probiotic because it upset her stomach. She declined to take a laxative. She was started on Colace twice daily, add probiotic, Anusol and return in 6-8 weeks.  Endoscopic evaluation 07/09/2015: EGD essentially normal. Colonoscopy with a benign polyp, no further recommended colonoscopies and less symptomatic in the future.  She was admitted to the hospital from 04/15/16 - 04/18/16 for AFib RVR, unable to take home diuretics due to nausea. Discharged home with cardiology follow-up. Seen back in the ER 04/24/15 for headache, dehydration. Patient with severe headache, leg weakness, noted decreased by mouth intake in the previous 2 days per family. Vitals are stable, mildly tachycardic with a pulse of 101. Afebrile. Labs including CBC found leukocytosis with white blood cell count of 14.6, mild anemia with a hemoglobin of 11.3. Basal metabolic profile found low sodium 133, elevated creatinine 1.3. Deemed that she likely has dehydration contributing to her symptoms. She was given IV fluid bolus and oral hydration. CBC with hemoconcentration, renal function improved from prior. Her headache resolved after  hydration and Tylenol, able to ambulate her baseline. She was discharged home in satisfactory condition.  Today she states she feels a little weakness still. Has been dehydrated recently. Felt better after fluids in the ER. Has an ongoing cough. Stomach feels "icky." Has had some constipation, but not as bad. Typically has loose stools but can be difficult to pass. Hemorrhoid is swollen at home, has Anusol at home which doesn't feel like it has helped. But states her hemorrhoid is internal. Her po intake is better, family is helping her push food and fluids. She feels like she's getting better overall. Feels like she may have picked up a "bug." Has had some mild lower abdominal tenderness. Some "icky" nausea feeling, but no vomiting, hematochezia, melena. Denies chest pain, dyspnea, dizziness, lightheadedness, syncope, near syncope. Denies any other upper or lower GI symptoms.  Past Medical History:  Diagnosis Date  . Anxiety   . Arthritis   . Bursitis    left shoulder  . Cataract   . CHF (congestive heart failure) (HCC)    EF 55-60%  . Complication of anesthesia   . COPD (chronic obstructive pulmonary disease) ([place])   . Coronary atherosclerosis of native coronary artery    a. DES to CX, moderately severe stenosis RCA, mild stenosis LAD 04/2013  . Depression   . Dysphagia, unspecified(787.20)   . GERD (gastroesophageal reflux disease)    Hx Schatzki's ring, multiple EGD/ED last 01/06/2004  . Headache   . Heart disease   . Heart murmur    'a littel'  . HTN (hypertension)    Hx of it  . Hyperlipemia   . Hyperlipidemia   . Internal hemorrhoids without mention of  complication   . MI (myocardial infarction) 2006  . Microscopic colitis 2003  . Other and unspecified hyperlipidemia   . Panic disorder without agoraphobia   . Paresthesia    hands, feet  . Pneumonia 12/2011  . PONV (postoperative nausea and vomiting)    'a little nausea"  . PVD (peripheral vascular disease) ([place])   . S/P  colonoscopy 09/27/2001   internal hemorrhoids, desc colon inflam polyp, SB BX-chronic duodenitis, colitis  . Shortness of breath   . Sleep disorder    obstructive  . Thyroid disease    recent abnl TSH per pt    Past Surgical History:  Procedure Laterality Date  . ABDOMINAL HYSTERECTOMY    . ANTERIOR AND POSTERIOR REPAIR     with resection of vagina  . APPENDECTOMY    . BACK SURGERY    . BIOPSY  07/05/2015   Procedure: BIOPSY;  Surgeon: Daneil Dolin, MD;  [facility];  Service: Endoscopy;;  gastric polyp biopsy, ascending colon biopsy  . BLADDER SUSPENSION  11/09/2011   Procedure: TRANSVAGINAL TAPE (TVT) PROCEDURE;  Surgeon: Marissa Nestle, MD;  Location: [facility];  Service: Urology;  Laterality: N/A;  . bladder tack  06/2010  . BREAST LUMPECTOMY  1998   left, benign  . CARDIAC CATHETERIZATION    . CARDIAC CATHETERIZATION    . CARDIAC CATHETERIZATION N/A 12/16/2015   Procedure: Left Heart Cath and Coronary Angiography;  Surgeon: Troy Sine, MD;  Location: [facility] LAB;  Service: Cardiovascular;  Laterality: N/A;  . [place]   left  . CHOLECYSTECTOMY  1998  . COLONOSCOPY  03/16/2011   multiple hyperplastic colon polyps, sigmoid diverticulosis, melanosis coli  . COLONOSCOPY WITH PROPOFOL N/A 07/05/2015   RMR:one 5 mm polyp in descending colon  . CORONARY ANGIOPLASTY WITH STENT PLACEMENT    . ESOPHAGEAL DILATION N/A 07/05/2015   Procedure: ESOPHAGEAL DILATION;  Surgeon: Daneil Dolin, MD;  [facility];  Service: Endoscopy;  Laterality: N/A;  . ESOPHAGOGASTRODUODENOSCOPY (EGD) WITH PROPOFOL N/A 07/05/2015   ALP:[id number]  . JOINT REPLACEMENT Right 2007  . left hand surgery    . LEFT HEART CATHETERIZATION WITH CORONARY ANGIOGRAM N/A 05/14/2013   Procedure: LEFT HEART CATHETERIZATION WITH CORONARY ANGIOGRAM;  Surgeon: Blane Ohara, MD;  Location[facility] CATH LAB;  Service: Cardiovascular;  Laterality: N/A;  . left rotator cuff surgery     . LUMBAR LAMINECTOMY/DECOMPRESSION MICRODISCECTOMY N/A 10/11/2012   Procedure: LUMBAR LAMINECTOMY/DECOMPRESSION MICRODISCECTOMY 2 LEVELS;  Surgeon: Floyce Stakes, MD;  Location: [facility] NEURO ORS;  Service: Neurosurgery;  Laterality: N/A;  L3-4 L4-5 Laminectomy  . LUMBAR WOUND DEBRIDEMENT N/A 09/27/2015   Procedure: Exploration of Lumbar Wound w/ Repair CSF Leak/Lumbar Drain Placement;  Surgeon: Leeroy Cha, MD;  Location: [facility] NEURO ORS;  Service: Neurosurgery;  Laterality: N/A;  . MALONEY DILATION  03/16/2011   Gastritis. No H.pylori on bx. 25F maloney dilation with disruption of  occult cevical esophageal web  . NASAL SINUS SURGERY    . right knee replacement  2007  . right leg benign tumor    . SHOULDER SURGERY Left   . TONSILLECTOMY    . unspecified area, hysterectomy  1972   partial    Current Outpatient Prescriptions  Medication Sig Dispense Refill  . acetaminophen (TYLENOL) 500 MG tablet Take 500 mg by mouth daily as needed for headache.     . ALPRAZolam (XANAX) 0.25 MG tablet Take 0.25 mg by mouth at  bedtime as needed for anxiety.    [name] Kitchen apixaban (ELIQUIS) 5 MG TABS tablet Take 1 tablet (5 mg total) by mouth 2 (two) times daily. 60 tablet 6  . bisacodyl (DULCOLAX) 5 MG EC tablet Take 1 tablet (5 mg total) by mouth daily as needed for moderate constipation. 30 tablet 0  . carvedilol (COREG) 25 MG tablet Take 12.5 mg by mouth 2 (two) times daily with a meal.     . diltiazem (CARDIZEM CD) 240 MG 24 hr capsule Take 1 capsule (240 mg total) by mouth daily. 30 capsule 0  . DULoxetine (CYMBALTA) 60 MG capsule Take 60 mg by mouth at bedtime.     . fluticasone (FLONASE) 50 MCG/ACT nasal spray Place 2 sprays into both nostrils daily.     . furosemide (LASIX) 20 MG tablet Take 1 tablet (20 mg total) by mouth daily. 10 tablet 0  . gabapentin (NEURONTIN) 300 MG capsule Take 1 capsule (300 mg total) by mouth 3 (three) times daily. 90 capsule 2  . hydrocortisone (ANUSOL-HC) 2.5 % rectal cream Place  1 application rectally 2 (two) times daily. 30 g 1  . ipratropium-albuterol (DUONEB) 0.5-2.5 (3) MG/3ML SOLN Take 3 mLs by nebulization every 6 (six) hours. 360 mL 0  . Melatonin 3 MG CAPS Take 1 capsule by mouth at bedtime.    . nitroGLYCERIN (NITROSTAT) 0.4 MG SL tablet Place 0.4 mg under the tongue every 5 (five) minutes as needed for chest pain. Reported on 08/04/2015    . pantoprazole (PROTONIX) 40 MG tablet Take 40 mg by mouth every morning.     . hydrocortisone (ANUSOL-HC) 25 MG suppository Place 1 suppository (25 mg total) rectally every 12 (twelve) hours. 12 suppository 1   No current facility-administered medications for this visit.     Allergies as of 04/26/2016 - Review Complete 04/26/2016  Allergen Reaction Noted  . Cephalosporins Diarrhea and Nausea Only 08/04/2015  . Levaquin [levofloxacin in d5w] Swelling 07/06/2014  . Macrodantin [nitrofurantoin macrocrystal] Swelling 07/06/2014  . Phenothiazines Anaphylaxis and Hives 08/16/2011  . Polysorbate Anaphylaxis 08/16/2011  . Prednisone Shortness Of Breath   . Buspirone Itching 07/06/2014  . Cardura [doxazosin mesylate] Itching 07/06/2014  . Codeine Itching   . Acyclovir and related  10/07/2015  . Prochlorperazine Other (See Comments) 08/16/2011  . Ranexa [ranolazine]  04/06/2015  . Atorvastatin Hives 08/16/2011  . Ofloxacin Rash   . Other Itching and Rash 10/10/2012  . Penicillins Other (See Comments) 11/09/2011  . Pimozide Hives and Itching 08/16/2011    Family History  Problem Relation Age of Onset  . Stroke Mother   . Parkinson's disease Father   . Coronary artery disease Other     family Hx-sons  . Cancer Other   . Stroke Other     family Hx  . Hypertension Other     family Hx  . Diabetes Brother   . Heart disease Son     before age 64  . Diabetes Son   . Stroke Daughter 7    Social History   Social History  . Marital status: Divorced    Spouse name: N/A  . Number of children: 5  . Years of  education: N/A   Occupational History  . retired    Social History Main Topics  . Smoking status: Former Smoker    Packs/day: 1.00    Years: 64.00    Types: Cigarettes    Start date: 12/24/1947    Quit date: 11/17/2001  . Smokeless  tobacco: Never Used     Comment: Quit smoking in 2003  . Alcohol use No  . Drug use: No  . Sexual activity: No   Other Topics Concern  . None   Social History Narrative   Divorced.   Sister had colon perforation & died from complications in [place], [place]    Review of Systems: General: Negative for anorexia, weight loss, fever, chills. ENT: Negative for hoarseness, difficulty swallowing. CV: Negative for chest pain, angina, palpitations, peripheral edema.  Respiratory: Negative for dyspnea at rest, cough, sputum, wheezing.  GI: See history of present illness. Derm: Negative for rash or itching.  Neuro: Negative for memory loss, confusion.  Endo: Negative for unusual weight change.  Heme: Negative for bruising or bleeding. Allergy: Negative for rash or hives.   Physical Exam: BP 104/65   Pulse 84   Temp 97.9 F (36.6 C) (Oral)   Ht 5\' 1"  (1.549 m)   Wt 157 lb (71.2 kg)   BMI 29.66 kg/m  General:   Alert and oriented. Pleasant and cooperative. Well-nourished and well-developed.  Eyes:  Without icterus, sclera clear and conjunctiva pink.  Ears:  Normal auditory acuity. Cardiovascular:  S1, S2 present without murmurs appreciated. Extremities without clubbing or edema. Respiratory:  Clear to auscultation bilaterally. No wheezes, rales, or rhonchi. No distress.  Gastrointestinal:  +BS, soft, non-tender and non-distended. No HSM noted. No guarding or rebound. No masses appreciated.  Rectal:  Deferred  Musculoskalatal:  Symmetrical without gross deformities. Neurologic:  Alert and oriented x4;  grossly normal neurologically. Psych:  Alert and cooperative. Normal mood and affect. Heme/Lymph/Immune: No excessive bruising noted.    04/26/2016 4:29  PM   Disclaimer: This note was dictated with voice recognition software. Similar sounding words can inadvertently be transcribed and may not be corrected upon review.

## 2016-04-27 ENCOUNTER — Ambulatory Visit (HOSPITAL_COMMUNITY)
Admission: RE | Admit: 2016-04-27 | Discharge: 2016-04-27 | Disposition: A | Discharge status: Home / Self Care | Payer: Medicare Other | Source: Ambulatory Visit | Attending: Cardiovascular Disease | Admitting: Cardiovascular Disease

## 2016-04-27 ENCOUNTER — Other Ambulatory Visit (HOSPITAL_COMMUNITY)
Admission: RE | Admit: 2016-04-27 | Discharge: 2016-04-27 | Disposition: A | Discharge status: Home / Self Care | Payer: Medicare Other | Source: Ambulatory Visit | Attending: Cardiovascular Disease | Admitting: Cardiovascular Disease

## 2016-04-27 ENCOUNTER — Encounter: Payer: Self-pay | Admitting: Cardiovascular Disease

## 2016-04-27 ENCOUNTER — Ambulatory Visit (INDEPENDENT_AMBULATORY_CARE_PROVIDER_SITE_OTHER): Payer: Medicare Other | Admitting: Cardiovascular Disease

## 2016-04-27 VITALS — BP 100/52 | HR 68 | Ht 61.0 in | Wt 156.0 lb

## 2016-04-27 DIAGNOSIS — R531 Weakness: Secondary | ICD-10-CM | POA: Diagnosis not present

## 2016-04-27 DIAGNOSIS — J449 Chronic obstructive pulmonary disease, unspecified: Secondary | ICD-10-CM

## 2016-04-27 DIAGNOSIS — I428 Other cardiomyopathies: Secondary | ICD-10-CM

## 2016-04-27 DIAGNOSIS — I10 Essential (primary) hypertension: Secondary | ICD-10-CM | POA: Diagnosis not present

## 2016-04-27 DIAGNOSIS — I509 Heart failure, unspecified: Secondary | ICD-10-CM | POA: Diagnosis not present

## 2016-04-27 DIAGNOSIS — N183 Chronic kidney disease, stage 3 (moderate): Secondary | ICD-10-CM | POA: Diagnosis not present

## 2016-04-27 DIAGNOSIS — I25118 Atherosclerotic heart disease of native coronary artery with other forms of angina pectoris: Secondary | ICD-10-CM

## 2016-04-27 DIAGNOSIS — I480 Paroxysmal atrial fibrillation: Secondary | ICD-10-CM | POA: Diagnosis not present

## 2016-04-27 DIAGNOSIS — I5023 Acute on chronic systolic (congestive) heart failure: Secondary | ICD-10-CM

## 2016-04-27 DIAGNOSIS — J441 Chronic obstructive pulmonary disease with (acute) exacerbation: Secondary | ICD-10-CM

## 2016-04-27 DIAGNOSIS — Z79899 Other long term (current) drug therapy: Secondary | ICD-10-CM

## 2016-04-27 DIAGNOSIS — I25119 Atherosclerotic heart disease of native coronary artery with unspecified angina pectoris: Secondary | ICD-10-CM | POA: Diagnosis not present

## 2016-04-27 DIAGNOSIS — I209 Angina pectoris, unspecified: Secondary | ICD-10-CM

## 2016-04-27 DIAGNOSIS — I481 Persistent atrial fibrillation: Secondary | ICD-10-CM

## 2016-04-27 DIAGNOSIS — I351 Nonrheumatic aortic (valve) insufficiency: Secondary | ICD-10-CM

## 2016-04-27 DIAGNOSIS — M62838 Other muscle spasm: Secondary | ICD-10-CM

## 2016-04-27 DIAGNOSIS — I4819 Other persistent atrial fibrillation: Secondary | ICD-10-CM

## 2016-04-27 DIAGNOSIS — Z9289 Personal history of other medical treatment: Secondary | ICD-10-CM

## 2016-04-27 DIAGNOSIS — I5031 Acute diastolic (congestive) heart failure: Secondary | ICD-10-CM | POA: Diagnosis not present

## 2016-04-27 DIAGNOSIS — E7800 Pure hypercholesterolemia, unspecified: Secondary | ICD-10-CM

## 2016-04-27 DIAGNOSIS — I130 Hypertensive heart and chronic kidney disease with heart failure and stage 1 through stage 4 chronic kidney disease, or unspecified chronic kidney disease: Secondary | ICD-10-CM | POA: Diagnosis not present

## 2016-04-27 LAB — MAGNESIUM: MAGNESIUM: 1.6 mg/dL — AB (ref 1.7–2.4)

## 2016-04-27 MED ORDER — FUROSEMIDE 40 MG PO TABS: ORAL_TABLET | ORAL | 3 refills | Status: DC

## 2016-04-27 MED ORDER — METOPROLOL SUCCINATE ER 50 MG PO TB24: 50.0000 mg | ORAL_TABLET | Freq: Every day | ORAL | 3 refills | Status: DC

## 2016-04-27 NOTE — Addendum Note (Signed)
Addended by: Barbarann Ehlers A on: 04/27/2016 03:15 PM   Modules accepted: Orders

## 2016-04-27 NOTE — Patient Instructions (Addendum)
Your physician recommends that you schedule a follow-up appointment in: 2 weeks with Gerrianne Scale PA-C   STOP Coreg  STOP Diltiazem   START Toprol XL 50 mg twice a day    Get lab work : magnesium level   You have been referred to pulmonary Dr Chase Caller   Get chest x-ray TODAY   ADDENDUM AFTER CHEST X-RAY, TAKE LASIX 40 MG TWICE A DAY FOR 3 DAYS, THEN TAKE 40 MG DAILY FROM THEN ON, GET LAB WORK Monday 05/01/16    Thank you for choosing [facility] !

## 2016-04-27 NOTE — Addendum Note (Signed)
Addended by: Barbarann Ehlers A on: 04/27/2016 01:45 PM   Modules accepted: Orders

## 2016-04-27 NOTE — Progress Notes (Addendum)
SUBJECTIVE: Patient presents for follow up after being hospitalized for rapid atrial fibrillation and acute systolic heart failure.  Echocardiogram 04/17/16:  - Left ventricle: The cavity size was normal. There was mild   concentric hypertrophy. Systolic function was moderately to   severely reduced. The estimated ejection fraction was in the   range of 30% to 35%. Wall motion was normal; there were no   regional wall motion abnormalities. - Aortic valve: Mildly calcified annulus. Trileaflet; normal   thickness, mildly calcified leaflets. There was mild   regurgitation. - Mitral valve: There was moderate regurgitation. - Left atrium: The atrium was moderately dilated. - Pulmonary arteries: Systolic pressure was mildly increased. PA   peak pressure: 38 mm Hg (S). - Pericardium, extracardiac: A small pericardial effusion was   identified.  This is a decline in LVEF which was previously 55% on 12/17/15.  She underwent percutaneous coronary intervention with drug-eluting stent to the left circumflex coronary artery in January 2015. Coronary angiography 12/16/15 showed 20% stenosis in the proximal LAD and in the inferior branch of the first diagonal with a widely patent previously placed circumflex stent. RCA was small and nondominant with 80% ostial stenosis. Medical therapy was recommended.  She has been short of breath, having leg spasms, feeling "jittery", and has been wheezing today with a fever this morning.  She had labwork earlier this week by PCP and was told potassium was normal (previously elevated).  Has intermittent epigastric/lower sternal pains.    Review of Systems: As per "subjective", otherwise negative.  Allergies  Allergen Reactions  . Cephalosporins Diarrhea and Nausea Only    Lightheaded  . Levaquin [Levofloxacin In D5w] Swelling  . Macrodantin [Nitrofurantoin Macrocrystal] Swelling  . Phenothiazines Anaphylaxis and Hives  . Polysorbate Anaphylaxis  .  Prednisone Shortness Of Breath  . Buspirone Itching  . Cardura [Doxazosin Mesylate] Itching  . Codeine Itching  . Acyclovir And Related   . Prochlorperazine Other (See Comments)    unknown  . Ranexa [Ranolazine]     Severe drop in BP  . Atorvastatin Hives    Cramping; tolerates Crestor ok  . Ofloxacin Rash  . Other Itching and Rash    "WOOL"= make skin look like it has been burned  . Penicillins Other (See Comments)    Causes redness all over. Has patient had a PCN reaction causing immediate rash, facial/tongue/throat swelling, SOB or lightheadedness with hypotension: No Has patient had a PCN reaction causing severe rash involving mucus membranes or skin necrosis: No Has patient had a PCN reaction that required hospitalization No Has patient had a PCN reaction occurring within the last 10 years: No If all of the above answers are "NO", then may proceed with Cephalosporin use.   . Pimozide Hives and Itching    Current Outpatient Prescriptions  Medication Sig Dispense Refill  . acetaminophen (TYLENOL) 500 MG tablet Take 500 mg by mouth daily as needed for headache.     . ALPRAZolam (XANAX) 0.25 MG tablet Take 0.25 mg by mouth at bedtime as needed for anxiety.    [name] Kitchen apixaban (ELIQUIS) 5 MG TABS tablet Take 1 tablet (5 mg total) by mouth 2 (two) times daily. 60 tablet 6  . bisacodyl (DULCOLAX) 5 MG EC tablet Take 1 tablet (5 mg total) by mouth daily as needed for moderate constipation. 30 tablet 0  . carvedilol (COREG) 25 MG tablet Take 12.5 mg by mouth 2 (two) times daily with a meal.     .  diltiazem (CARDIZEM CD) 240 MG 24 hr capsule Take 1 capsule (240 mg total) by mouth daily. 30 capsule 0  . DULoxetine (CYMBALTA) 60 MG capsule Take 60 mg by mouth at bedtime.     . fluticasone (FLONASE) 50 MCG/ACT nasal spray Place 2 sprays into both nostrils daily.     . furosemide (LASIX) 20 MG tablet Take 1 tablet (20 mg total) by mouth daily. 10 tablet 0  . gabapentin (NEURONTIN) 300 MG capsule  Take 1 capsule (300 mg total) by mouth 3 (three) times daily. 90 capsule 2  . hydrocortisone (ANUSOL-HC) 2.5 % rectal cream Place 1 application rectally 2 (two) times daily. 30 g 1  . hydrocortisone (ANUSOL-HC) 25 MG suppository Place 1 suppository (25 mg total) rectally every 12 (twelve) hours. 12 suppository 1  . ipratropium-albuterol (DUONEB) 0.5-2.5 (3) MG/3ML SOLN Take 3 mLs by nebulization every 6 (six) hours. 360 mL 0  . Melatonin 3 MG CAPS Take 1 capsule by mouth at bedtime.    . nitroGLYCERIN (NITROSTAT) 0.4 MG SL tablet Place 0.4 mg under the tongue every 5 (five) minutes as needed for chest pain. Reported on 08/04/2015    . pantoprazole (PROTONIX) 40 MG tablet Take 40 mg by mouth every morning.      No current facility-administered medications for this visit.     Past Medical History:  Diagnosis Date  . Anxiety   . Arthritis   . Bursitis    left shoulder  . Cataract   . CHF (congestive heart failure) (HCC)    EF 55-60%  . Complication of anesthesia   . COPD (chronic obstructive pulmonary disease) ([place])   . Coronary atherosclerosis of native coronary artery    a. DES to CX, moderately severe stenosis RCA, mild stenosis LAD 04/2013  . Depression   . Dysphagia, unspecified(787.20)   . GERD (gastroesophageal reflux disease)    Hx Schatzki's ring, multiple EGD/ED last 01/06/2004  . Headache   . Heart disease   . Heart murmur    'a littel'  . HTN (hypertension)    Hx of it  . Hyperlipemia   . Hyperlipidemia   . Internal hemorrhoids without mention of complication   . MI (myocardial infarction) 2006  . Microscopic colitis 2003  . Other and unspecified hyperlipidemia   . Panic disorder without agoraphobia   . Paresthesia    hands, feet  . Pneumonia 12/2011  . PONV (postoperative nausea and vomiting)    'a little nausea"  . PVD (peripheral vascular disease) ([place])   . S/P colonoscopy 09/27/2001   internal hemorrhoids, desc colon inflam polyp, SB BX-chronic duodenitis,  colitis  . Shortness of breath   . Sleep disorder    obstructive  . Thyroid disease    recent abnl TSH per pt    Past Surgical History:  Procedure Laterality Date  . ABDOMINAL HYSTERECTOMY    . ANTERIOR AND POSTERIOR REPAIR     with resection of vagina  . APPENDECTOMY    . BACK SURGERY    . BIOPSY  07/05/2015   Procedure: BIOPSY;  Surgeon: Daneil Dolin, MD;  [facility];  Service: Endoscopy;;  gastric polyp biopsy, ascending colon biopsy  . BLADDER SUSPENSION  11/09/2011   Procedure: TRANSVAGINAL TAPE (TVT) PROCEDURE;  Surgeon: Marissa Nestle, MD;  Location: [facility];  Service: Urology;  Laterality: N/A;  . bladder tack  06/2010  . BREAST LUMPECTOMY  1998   left, benign  . CARDIAC CATHETERIZATION    .  CARDIAC CATHETERIZATION    . CARDIAC CATHETERIZATION N/A 12/16/2015   Procedure: Left Heart Cath and Coronary Angiography;  Surgeon: Troy Sine, MD;  Location: [place] [facility] CV LAB;  Service: Cardiovascular;  Laterality: N/A;  . [employer]   left  . CHOLECYSTECTOMY  1998  . COLONOSCOPY  03/16/2011   multiple hyperplastic colon polyps, sigmoid diverticulosis, melanosis coli  . COLONOSCOPY WITH PROPOFOL N/A 07/05/2015   RMR:one 5 mm polyp in descending colon  . CORONARY ANGIOPLASTY WITH STENT PLACEMENT    . ESOPHAGEAL DILATION N/A 07/05/2015   Procedure: ESOPHAGEAL DILATION;  Surgeon: Daneil Dolin, MD;  [facility];  Service: Endoscopy;  Laterality: N/A;  . ESOPHAGOGASTRODUODENOSCOPY (EGD) WITH PROPOFOL N/A 07/05/2015   OVF:IEPPIR  . JOINT REPLACEMENT Right 2007  . left hand surgery    . LEFT HEART CATHETERIZATION WITH CORONARY ANGIOGRAM N/A 05/14/2013   Procedure: LEFT HEART CATHETERIZATION WITH CORONARY ANGIOGRAM;  Surgeon: Blane Ohara, MD;  Location: [facility] CATH LAB;  Service: Cardiovascular;  Laterality: N/A;  . left rotator cuff surgery    . LUMBAR LAMINECTOMY/DECOMPRESSION MICRODISCECTOMY N/A 10/11/2012   Procedure: LUMBAR  LAMINECTOMY/DECOMPRESSION MICRODISCECTOMY 2 LEVELS;  Surgeon: Floyce Stakes, MD;  Location: [facility] NEURO ORS;  Service: Neurosurgery;  Laterality: N/A;  L3-4 L4-5 Laminectomy  . LUMBAR WOUND DEBRIDEMENT N/A 09/27/2015   Procedure: Exploration of Lumbar Wound w/ Repair CSF Leak/Lumbar Drain Placement;  Surgeon: Leeroy Cha, MD;  Location: [place] NEURO ORS;  Service: Neurosurgery;  Laterality: N/A;  . MALONEY DILATION  03/16/2011   Gastritis. No H.pylori on bx. 59F maloney dilation with disruption of  occult cevical esophageal web  . NASAL SINUS SURGERY    . right knee replacement  2007  . right leg benign tumor    . SHOULDER SURGERY Left   . TONSILLECTOMY    . unspecified area, hysterectomy  1972   partial    Social History   Social History  . Marital status: Divorced    Spouse name: N/A  . Number of children: 5  . Years of education: N/A   Occupational History  . retired    Social History Main Topics  . Smoking status: Former Smoker    Packs/day: 1.00    Years: 64.00    Types: Cigarettes    Start date: 12/24/1947    Quit date: 11/17/2001  . Smokeless tobacco: Never Used     Comment: Quit smoking in 2003  . Alcohol use No  . Drug use: No  . Sexual activity: No   Other Topics Concern  . Not on file   Social History Narrative   Divorced.   Sister had colon perforation & died from complications in [place], [place]     Vitals:   04/27/16 1301  BP: (!) 100/52  Pulse: 68  SpO2: 97%  Weight: 156 lb (70.8 kg)  Height: 5\' 1"  (1.549 m)    PHYSICAL EXAM General: NAD HEENT: Normal. Neck: No JVD, no thyromegaly. Lungs: Diffuse rhonchi, no rales. CV: Nondisplaced PMI.  HR upper normal limits irregular rhythm, normal S1/S2, no S3, no murmur. No pretibial or periankle edema.    Abdomen: Soft, nontender, no distention.  Neurologic: Alert and oriented.  Psych: Normal affect.     ECG: Most recent ECG reviewed.      ASSESSMENT AND PLAN: 1. CAD with DES to LCx: Stable. Cath  reviewed above. Continue current therapy with aspirin. Will switch Coreg to Toprol-XL 50 mg bid. Statin intolerant.  2. Essential HTN: Controlled. Monitor given medication adjustments.  3. Acute on chronic systolic heart failure, EF 30-35% (previously 55%): Euvolemic. Obtain chest xray. Continue Lasix at present dose. Will switch Coreg to Toprol-XL. GFR 37 cc/min 04/23/16. Will hold off on ACEI/ARB.  Possibly tachycardia-mediated. Single normal troponin while hospitalized. Wt 156 lbs today (157 on 04/17/16).  4. Hyperlipidemia: Statin intolerant.  5. Aortic regurgitation: Will monitor clinically and with surveillance echocardiograms. Mild at present.  6. Atrial fibrillation: Given severe LV systolic dysfunction, will dc diltiazem and switch to Toprol-XL 50 mg bid for heart rate control. Continue  Eliquis.   7. COPD exacerbation: Has diffuse rhonchi with fever today. I'm concerned about an unresolved upper respiratory tract infection. I will obtain a chest xray and make a referral to pulmonary. She is reportedly allergic to prednisone, which is what I would have started today.  8. Leg spasms and slurred speech: Needs to see neurology. Will obtain magnesium level.  Dispo: fu 2 wks.  Time spent: 40 minutes, of which greater than 50% was spent reviewing symptoms, relevant blood tests and studies, and discussing management plan with the patient.  ADDENDUM: Chest xray with CHF. Will increase Lasix to 40 mg twice daily x 3 days, then reduce to 40 mg daily. Will check BMET on 1/15.   Kate Sable, M.D., F.A.C.C.

## 2016-04-27 NOTE — Progress Notes (Signed)
cc'ed to pcp °

## 2016-04-28 ENCOUNTER — Encounter: Payer: Self-pay | Admitting: *Deleted

## 2016-04-28 ENCOUNTER — Telehealth: Payer: Self-pay

## 2016-04-28 ENCOUNTER — Other Ambulatory Visit: Payer: Self-pay | Admitting: *Deleted

## 2016-04-28 MED ORDER — MAGNESIUM 400 MG PO TABS: 400.0000 mg | ORAL_TABLET | Freq: Every day | ORAL | 3 refills | Status: DC

## 2016-04-28 NOTE — Patient Outreach (Signed)
Subjective:  Routine home visit with pt, HIPAA verified, daughter present, pt reports she continues weighing daily and has lost weight due to taking extra lasix and "appetite has not been quite as good"  . Weight today 153.6 pounds,  Home health continues RN and PT. Diarrhea mostly resolved, has occasional diarrhea and takes immodium.  Pt saw primary care on 04/24/16 and no changes made, saw urologist and GI MD recently, saw cardiologist Dr. Raliegh Ip on 04/27/16 and rhonchi noted in lungs, had blood work and chest xray and pt placed on lasix regimen 53m BID x 3 days, coreg discontinued, toprol added and pt to have follow up labs on Monday 05/01/16. Pt went to ED this past Sunday for weakness and unable to walk, pt followed up with MD since ED visit, pt is able to ambulate with walker now but is fatigued. Pt reports she is coughing up " light colored mucus, small amount".  Vital signs: Blood pressure 126/62 Pulse 100 Respirations 18 02 saturation 94% Weight 153.6  No edema noted Rhonchi noted all lobes Alert and oriented x 3 Skin warm and dry  Medications Reviewed Today    Reviewed by JKassie Mends RN (Registered Nurse) on 04/28/16 at 1[place] Status: <None>  Medication Order Taking? Sig Documenting Provider Last Dose Status Informant  acetaminophen (TYLENOL) 500 MG tablet [id number] Take 500 mg by mouth daily as needed for headache.  Historical Provider, MD Taking Active Self  ALPRAZolam (XANAX) 0.25 MG tablet [id number] Take 0.25 mg by mouth at bedtime as needed for anxiety. Historical Provider, MD Taking Active Self  apixaban (ELIQUIS) 5 MG TABS tablet [id number] Take 1 tablet (5 mg total) by mouth 2 (two) times daily. SHerminio Commons MD Taking Active Self  bisacodyl (DULCOLAX) 5 MG EC tablet [id number] Take 1 tablet (5 mg total) by mouth daily as needed for moderate constipation. PThurnell Lose MD Taking Active Self  DULoxetine (CYMBALTA) 60 MG capsule [id number] Take  60 mg by mouth at bedtime.  Historical Provider, MD Taking Active Self           Med Note (Joaquin Courts MARCI A   Fri May 09, 2013  9:27 AM)    fluticasone (FLONASE) 50 MCG/ACT nasal spray [id number] Place 2 sprays into both nostrils daily.  Historical Provider, MD Taking Active Self  furosemide (LASIX) 40 MG tablet [id number] TAKE 40 MG TWICE A DAY FOR 3 DAYS, THEN TAKE 40 MG DAILY FROM THEN ON SHerminio Commons MD Taking Active   gabapentin (NEURONTIN) 300 MG capsule [id number] Take 1 capsule (300 mg total) by mouth 3 (three) times daily. BKevan NyDitty, MD Taking Active Self  hydrocortisone (ANUSOL-HC) 2.5 % rectal cream [id number] Place 1 application rectally 2 (two) times daily. AAnnitta Needs NP Taking Active Self  hydrocortisone (ANUSOL-HC) 25 MG suppository [id number] Place 1 suppository (25 mg total) rectally every 12 (twelve) hours. ECarlis Stable NP Taking Active   ipratropium-albuterol (DUONEB) 0.5-2.5 (3) MG/3ML SOLN [id number] Take 3 mLs by nebulization every 6 (six) hours. BElmarie Shiley MD Taking Active Self  Magnesium 400 MG TABS [id number] Take 400 mg by mouth daily. SHerminio Commons MD Taking Active   Melatonin 3 MG CAPS [id number] Take 1 capsule by mouth at bedtime. Historical Provider, MD Taking Active Self  metoprolol succinate (TOPROL-XL) 50 MG 24 hr tablet [id number] Take 1 tablet (50 mg total) by mouth  daily. Take with or immediately following a meal. Herminio Commons, MD Taking Active   nitroGLYCERIN (NITROSTAT) 0.4 MG SL tablet [id number] Yes Place 0.4 mg under the tongue every 5 (five) minutes as needed for chest pain. Reported on 08/04/2015 Historical Provider, MD Taking Active Self           Med Note Lurlean Horns Feb 21, 2016 12:51 PM) On hand  pantoprazole (PROTONIX) 40 MG tablet [id number] Yes Take 40 mg by mouth every morning.  Historical Provider, MD Taking Active Self         Assessment:  Pt had chest xray and blood  work and fluid noted to right lung, will follow up with cardiologist on 05/10/16 and to have more blood work Monday.  Pt is not on antibiotic and states she cannot take prednisone.  Home health continues to see pt several times weekly.  RN CM reviewed symptom management with pt and daughter and resources to call ( home health, 24 hour nurse line, MD on call, ED) for any worsening symptoms (fever, change in mucus, etc.)  RN CM reviewed signs/ symptoms infection. Daughter verbalizes understanding.  THN CM Care Plan Problem One   Flowsheet Row Most Recent Value  Care Plan Problem One  Knowledge Deficits related to self health management of CHF  Role Documenting the Problem One  Care Management [place] for Problem One  Active  THN Long Term Goal (31-90 days)  Over the next 60 days, patient will verbalize understanding of plan of care for self health management of CHF  THN Long Term Goal Start Date  04/28/16  Interventions for Problem One Long Term Goal  RN CM reiterated importance of daily weights and correlation to CHF  THN CM Short Term Goal #1 (0-30 days)  Pt will have all medications and take as prescribed over next 30 days.  THN CM Short Term Goal #1 Start Date  03/24/16  [facility] CM Short Term Goal #1 Met Date  04/28/16  Interventions for Short Term Goal #1  RN CM reviewed medications with pt and daughter  THN CM Short Term Goal #2 (0-30 days)  Over the next 30 days, patient will weigh daily, record, and call provider for weight gain of 3# overnight or 5# in a week  THN CM Short Term Goal #2 Start Date  04/28/16  THN CM Short Term Goal #2 Met Date  -- [ongoing]  Interventions for Short Term Goal #2  Reinforced importance of daily weights, pt is weighing daily, this goal will be ongoing over the next few weeks  THN CM Short Term Goal #3 (0-30 days)  Over the next 30 days, patient will take all medications as prescribed as evidenced by patient/family report of same  [facility] CM Short Term  Goal #3 Start Date  02/21/16  [facility] CM Short Term Goal #3 Met Date  04/28/16  Interventions for Short [place] Goal #3  Reinforced medications- dosage, purpose, side effects.     PLAN See pt for home visit 05/18/16  Jacqlyn Larsen [facility], [place] Coordinator [phone]

## 2016-04-28 NOTE — Telephone Encounter (Signed)
-----   Message from Herminio Commons, MD sent at 04/28/2016  3:37 AM EST ----- Mg is low. Start 400 mg magnesium oxide daily and repeat in one week.

## 2016-04-28 NOTE — Telephone Encounter (Signed)
I spoke with daughter they will repeat labs in 1 week,escribed magnesium to pharmacy

## 2016-05-01 DIAGNOSIS — D649 Anemia, unspecified: Secondary | ICD-10-CM | POA: Diagnosis present

## 2016-05-01 DIAGNOSIS — R0602 Shortness of breath: Secondary | ICD-10-CM | POA: Diagnosis not present

## 2016-05-01 DIAGNOSIS — E785 Hyperlipidemia, unspecified: Secondary | ICD-10-CM | POA: Diagnosis present

## 2016-05-01 DIAGNOSIS — J441 Chronic obstructive pulmonary disease with (acute) exacerbation: Secondary | ICD-10-CM | POA: Diagnosis present

## 2016-05-01 DIAGNOSIS — R0789 Other chest pain: Secondary | ICD-10-CM | POA: Diagnosis not present

## 2016-05-01 DIAGNOSIS — R4182 Altered mental status, unspecified: Secondary | ICD-10-CM | POA: Diagnosis not present

## 2016-05-01 DIAGNOSIS — E7800 Pure hypercholesterolemia, unspecified: Secondary | ICD-10-CM | POA: Diagnosis present

## 2016-05-01 DIAGNOSIS — Z833 Family history of diabetes mellitus: Secondary | ICD-10-CM | POA: Diagnosis not present

## 2016-05-01 DIAGNOSIS — I5031 Acute diastolic (congestive) heart failure: Secondary | ICD-10-CM | POA: Diagnosis not present

## 2016-05-01 DIAGNOSIS — G4733 Obstructive sleep apnea (adult) (pediatric): Secondary | ICD-10-CM | POA: Diagnosis present

## 2016-05-01 DIAGNOSIS — F410 Panic disorder [episodic paroxysmal anxiety] without agoraphobia: Secondary | ICD-10-CM | POA: Diagnosis present

## 2016-05-01 DIAGNOSIS — Z7982 Long term (current) use of aspirin: Secondary | ICD-10-CM | POA: Diagnosis not present

## 2016-05-01 DIAGNOSIS — Z8249 Family history of ischemic heart disease and other diseases of the circulatory system: Secondary | ICD-10-CM | POA: Diagnosis not present

## 2016-05-01 DIAGNOSIS — Z881 Allergy status to other antibiotic agents status: Secondary | ICD-10-CM | POA: Diagnosis not present

## 2016-05-01 DIAGNOSIS — I130 Hypertensive heart and chronic kidney disease with heart failure and stage 1 through stage 4 chronic kidney disease, or unspecified chronic kidney disease: Secondary | ICD-10-CM | POA: Diagnosis not present

## 2016-05-01 DIAGNOSIS — N179 Acute kidney failure, unspecified: Secondary | ICD-10-CM | POA: Diagnosis present

## 2016-05-01 DIAGNOSIS — J440 Chronic obstructive pulmonary disease with acute lower respiratory infection: Secondary | ICD-10-CM | POA: Diagnosis not present

## 2016-05-01 DIAGNOSIS — E876 Hypokalemia: Secondary | ICD-10-CM | POA: Diagnosis present

## 2016-05-01 DIAGNOSIS — Z823 Family history of stroke: Secondary | ICD-10-CM | POA: Diagnosis not present

## 2016-05-01 DIAGNOSIS — I4891 Unspecified atrial fibrillation: Secondary | ICD-10-CM | POA: Diagnosis not present

## 2016-05-01 DIAGNOSIS — J1000 Influenza due to other identified influenza virus with unspecified type of pneumonia: Secondary | ICD-10-CM | POA: Diagnosis not present

## 2016-05-01 DIAGNOSIS — I2510 Atherosclerotic heart disease of native coronary artery without angina pectoris: Secondary | ICD-10-CM | POA: Diagnosis not present

## 2016-05-01 DIAGNOSIS — I25119 Atherosclerotic heart disease of native coronary artery with unspecified angina pectoris: Secondary | ICD-10-CM | POA: Diagnosis not present

## 2016-05-01 DIAGNOSIS — J111 Influenza due to unidentified influenza virus with other respiratory manifestations: Secondary | ICD-10-CM | POA: Diagnosis not present

## 2016-05-01 DIAGNOSIS — K219 Gastro-esophageal reflux disease without esophagitis: Secondary | ICD-10-CM | POA: Diagnosis present

## 2016-05-01 DIAGNOSIS — I480 Paroxysmal atrial fibrillation: Secondary | ICD-10-CM | POA: Diagnosis present

## 2016-05-01 DIAGNOSIS — Z886 Allergy status to analgesic agent status: Secondary | ICD-10-CM | POA: Diagnosis not present

## 2016-05-01 DIAGNOSIS — Z888 Allergy status to other drugs, medicaments and biological substances status: Secondary | ICD-10-CM | POA: Diagnosis not present

## 2016-05-01 DIAGNOSIS — G8929 Other chronic pain: Secondary | ICD-10-CM | POA: Diagnosis present

## 2016-05-01 DIAGNOSIS — F411 Generalized anxiety disorder: Secondary | ICD-10-CM | POA: Diagnosis present

## 2016-05-01 DIAGNOSIS — I10 Essential (primary) hypertension: Secondary | ICD-10-CM | POA: Diagnosis present

## 2016-05-01 DIAGNOSIS — Z87891 Personal history of nicotine dependence: Secondary | ICD-10-CM | POA: Diagnosis not present

## 2016-05-01 DIAGNOSIS — J189 Pneumonia, unspecified organism: Secondary | ICD-10-CM | POA: Diagnosis not present

## 2016-05-01 DIAGNOSIS — J449 Chronic obstructive pulmonary disease, unspecified: Secondary | ICD-10-CM | POA: Diagnosis not present

## 2016-05-01 DIAGNOSIS — R531 Weakness: Secondary | ICD-10-CM | POA: Diagnosis not present

## 2016-05-01 DIAGNOSIS — N183 Chronic kidney disease, stage 3 (moderate): Secondary | ICD-10-CM | POA: Diagnosis not present

## 2016-05-09 DIAGNOSIS — J441 Chronic obstructive pulmonary disease with (acute) exacerbation: Secondary | ICD-10-CM | POA: Diagnosis not present

## 2016-05-09 DIAGNOSIS — I130 Hypertensive heart and chronic kidney disease with heart failure and stage 1 through stage 4 chronic kidney disease, or unspecified chronic kidney disease: Secondary | ICD-10-CM | POA: Diagnosis not present

## 2016-05-09 DIAGNOSIS — N183 Chronic kidney disease, stage 3 (moderate): Secondary | ICD-10-CM | POA: Diagnosis not present

## 2016-05-09 DIAGNOSIS — I480 Paroxysmal atrial fibrillation: Secondary | ICD-10-CM | POA: Diagnosis not present

## 2016-05-09 DIAGNOSIS — I25119 Atherosclerotic heart disease of native coronary artery with unspecified angina pectoris: Secondary | ICD-10-CM | POA: Diagnosis not present

## 2016-05-09 DIAGNOSIS — I5031 Acute diastolic (congestive) heart failure: Secondary | ICD-10-CM | POA: Diagnosis not present

## 2016-05-10 ENCOUNTER — Ambulatory Visit: Payer: Medicare Other | Admitting: Physician Assistant

## 2016-05-10 DIAGNOSIS — D649 Anemia, unspecified: Secondary | ICD-10-CM | POA: Insufficient documentation

## 2016-05-11 DIAGNOSIS — I482 Chronic atrial fibrillation: Secondary | ICD-10-CM | POA: Diagnosis not present

## 2016-05-11 DIAGNOSIS — F419 Anxiety disorder, unspecified: Secondary | ICD-10-CM | POA: Diagnosis not present

## 2016-05-11 DIAGNOSIS — I130 Hypertensive heart and chronic kidney disease with heart failure and stage 1 through stage 4 chronic kidney disease, or unspecified chronic kidney disease: Secondary | ICD-10-CM | POA: Diagnosis not present

## 2016-05-11 DIAGNOSIS — J441 Chronic obstructive pulmonary disease with (acute) exacerbation: Secondary | ICD-10-CM | POA: Diagnosis not present

## 2016-05-11 DIAGNOSIS — I5031 Acute diastolic (congestive) heart failure: Secondary | ICD-10-CM | POA: Diagnosis not present

## 2016-05-11 DIAGNOSIS — N183 Chronic kidney disease, stage 3 (moderate): Secondary | ICD-10-CM | POA: Diagnosis not present

## 2016-05-11 DIAGNOSIS — I25119 Atherosclerotic heart disease of native coronary artery with unspecified angina pectoris: Secondary | ICD-10-CM | POA: Diagnosis not present

## 2016-05-11 DIAGNOSIS — E876 Hypokalemia: Secondary | ICD-10-CM | POA: Diagnosis not present

## 2016-05-11 DIAGNOSIS — I480 Paroxysmal atrial fibrillation: Secondary | ICD-10-CM | POA: Diagnosis not present

## 2016-05-11 DIAGNOSIS — D649 Anemia, unspecified: Secondary | ICD-10-CM | POA: Diagnosis not present

## 2016-05-11 DIAGNOSIS — J440 Chronic obstructive pulmonary disease with acute lower respiratory infection: Secondary | ICD-10-CM | POA: Diagnosis not present

## 2016-05-11 DIAGNOSIS — M545 Low back pain: Secondary | ICD-10-CM | POA: Diagnosis not present

## 2016-05-11 DIAGNOSIS — Z6828 Body mass index (BMI) 28.0-28.9, adult: Secondary | ICD-10-CM | POA: Diagnosis not present

## 2016-05-11 DIAGNOSIS — I5033 Acute on chronic diastolic (congestive) heart failure: Secondary | ICD-10-CM | POA: Diagnosis not present

## 2016-05-16 DIAGNOSIS — I5031 Acute diastolic (congestive) heart failure: Secondary | ICD-10-CM | POA: Diagnosis not present

## 2016-05-16 DIAGNOSIS — I25119 Atherosclerotic heart disease of native coronary artery with unspecified angina pectoris: Secondary | ICD-10-CM | POA: Diagnosis not present

## 2016-05-16 DIAGNOSIS — I480 Paroxysmal atrial fibrillation: Secondary | ICD-10-CM | POA: Diagnosis not present

## 2016-05-16 DIAGNOSIS — I130 Hypertensive heart and chronic kidney disease with heart failure and stage 1 through stage 4 chronic kidney disease, or unspecified chronic kidney disease: Secondary | ICD-10-CM | POA: Diagnosis not present

## 2016-05-16 DIAGNOSIS — N183 Chronic kidney disease, stage 3 (moderate): Secondary | ICD-10-CM | POA: Diagnosis not present

## 2016-05-16 DIAGNOSIS — J441 Chronic obstructive pulmonary disease with (acute) exacerbation: Secondary | ICD-10-CM | POA: Diagnosis not present

## 2016-05-17 DIAGNOSIS — M79671 Pain in right foot: Secondary | ICD-10-CM | POA: Diagnosis not present

## 2016-05-18 ENCOUNTER — Encounter: Payer: Self-pay | Admitting: *Deleted

## 2016-05-18 ENCOUNTER — Other Ambulatory Visit: Payer: Self-pay | Admitting: *Deleted

## 2016-05-18 DIAGNOSIS — I482 Chronic atrial fibrillation: Secondary | ICD-10-CM | POA: Diagnosis not present

## 2016-05-18 DIAGNOSIS — N183 Chronic kidney disease, stage 3 (moderate): Secondary | ICD-10-CM | POA: Diagnosis not present

## 2016-05-18 DIAGNOSIS — I5033 Acute on chronic diastolic (congestive) heart failure: Secondary | ICD-10-CM | POA: Diagnosis not present

## 2016-05-18 DIAGNOSIS — J441 Chronic obstructive pulmonary disease with (acute) exacerbation: Secondary | ICD-10-CM | POA: Diagnosis not present

## 2016-05-18 DIAGNOSIS — M545 Low back pain: Secondary | ICD-10-CM | POA: Diagnosis not present

## 2016-05-18 DIAGNOSIS — I480 Paroxysmal atrial fibrillation: Secondary | ICD-10-CM | POA: Diagnosis not present

## 2016-05-18 DIAGNOSIS — I130 Hypertensive heart and chronic kidney disease with heart failure and stage 1 through stage 4 chronic kidney disease, or unspecified chronic kidney disease: Secondary | ICD-10-CM | POA: Diagnosis not present

## 2016-05-18 DIAGNOSIS — I5031 Acute diastolic (congestive) heart failure: Secondary | ICD-10-CM | POA: Diagnosis not present

## 2016-05-18 DIAGNOSIS — F419 Anxiety disorder, unspecified: Secondary | ICD-10-CM | POA: Diagnosis not present

## 2016-05-18 DIAGNOSIS — B001 Herpesviral vesicular dermatitis: Secondary | ICD-10-CM | POA: Diagnosis not present

## 2016-05-18 DIAGNOSIS — I25119 Atherosclerotic heart disease of native coronary artery with unspecified angina pectoris: Secondary | ICD-10-CM | POA: Diagnosis not present

## 2016-05-18 DIAGNOSIS — D649 Anemia, unspecified: Secondary | ICD-10-CM | POA: Diagnosis not present

## 2016-05-18 DIAGNOSIS — Z6828 Body mass index (BMI) 28.0-28.9, adult: Secondary | ICD-10-CM | POA: Diagnosis not present

## 2016-05-18 NOTE — Patient Outreach (Signed)
[facility]) Care Management   05/18/2016  [name] [birth year] [id number]  [name] is an 83 y.o. female  Subjective: Routine home visit with pt, HIPAA verified, pt reports she has had some swelling in right foot, saw MD yesterday, had x rays and blood work and MD feels pt has gout and will let pt know results of tests and "let me know for sure if gout".  Pt reports no medication changes.  Pt continues weighing daily, states " other than my foot, I feel the best I have in a long time"  Objective:   Vitals:   05/18/16 1211  BP: 108/60  Pulse: 92  Resp: 18  SpO2: 94%  Weight: 150 lb (68 kg)   ROS  Physical Exam  Constitutional: She is oriented to person, place, and time. She appears well-developed and well-nourished.  HENT:  Head: Normocephalic.  Neck: Normal range of motion. Neck supple.  Cardiovascular: Normal rate.   Respiratory: Effort normal and breath sounds normal.  Dyspnea with exertion  GI: Soft.  Musculoskeletal: Normal range of motion. She exhibits edema.  2+ edema right lower extremity  Neurological: She is alert and oriented to person, place, and time.  Skin: Skin is warm and dry.  Psychiatric: She has a normal mood and affect. Her behavior is normal. Judgment and thought content normal.    Encounter Medications:   Outpatient Encounter Prescriptions as of 05/18/2016  Medication Sig Note  . acetaminophen (TYLENOL) 500 MG tablet Take 500 mg by mouth daily as needed for headache.    . ALPRAZolam (XANAX) 0.25 MG tablet Take 0.25 mg by mouth at bedtime as needed for anxiety.   [name] Kitchen apixaban (ELIQUIS) 5 MG TABS tablet Take 1 tablet (5 mg total) by mouth 2 (two) times daily.   . bisacodyl (DULCOLAX) 5 MG EC tablet Take 1 tablet (5 mg total) by mouth daily as needed for moderate constipation.   . DULoxetine (CYMBALTA) 60 MG capsule Take 60 mg by mouth at bedtime.    . fluticasone (FLONASE) 50 MCG/ACT nasal spray Place 2 sprays into both nostrils  daily.    . furosemide (LASIX) 40 MG tablet TAKE 40 MG TWICE A DAY FOR 3 DAYS, THEN TAKE 40 MG DAILY FROM THEN ON   . gabapentin (NEURONTIN) 300 MG capsule Take 1 capsule (300 mg total) by mouth 3 (three) times daily.   . hydrocortisone (ANUSOL-HC) 2.5 % rectal cream Place 1 application rectally 2 (two) times daily.   . hydrocortisone (ANUSOL-HC) 25 MG suppository Place 1 suppository (25 mg total) rectally every 12 (twelve) hours.   [name] Kitchen ipratropium-albuterol (DUONEB) 0.5-2.5 (3) MG/3ML SOLN Take 3 mLs by nebulization every 6 (six) hours.   . Magnesium 400 MG TABS Take 400 mg by mouth daily.   . Melatonin 3 MG CAPS Take 1 capsule by mouth at bedtime.   . metoprolol succinate (TOPROL-XL) 50 MG 24 hr tablet Take 1 tablet (50 mg total) by mouth daily. Take with or immediately following a meal.   . nitroGLYCERIN (NITROSTAT) 0.4 MG SL tablet Place 0.4 mg under the tongue every 5 (five) minutes as needed for chest pain. Reported on 08/04/2015 02/21/2016: On hand  . pantoprazole (PROTONIX) 40 MG tablet Take 40 mg by mouth every morning.     No facility-administered encounter medications on file as of 05/18/2016.     Functional Status:   In your present state of health, do you have any difficulty performing the following activities: 04/15/2016  03/24/2016  Hearing? N N  Vision? N N  Difficulty concentrating or making decisions? N N  Walking or climbing stairs? N Y  Dressing or bathing? N N  Doing errands, shopping? N N  Preparing Food and eating ? - N  Using the Toilet? - N  In the past six months, have you accidently leaked urine? - N  Do you have problems with loss of bowel control? - Y  Managing your Medications? - N  Managing your Finances? - Y  Housekeeping or managing your Housekeeping? - Y  Some recent data might be hidden    Fall/Depression Screening:    PHQ 2/9 Scores 03/24/2016  PHQ - 2 Score 1    Assessment:  RN CM faxed quarterly update to primary MD office to [facility], RN  CM reviewed medications with pt, reviewed CHF zones/ action plan, COPD zones/ action plan, ask pt to follow up with MD about test results related to possible gout.  RN CM talked with pt about discharge plan and in light of new issue with right foot, pt feels she could benefit from another home visit next month in case she has questions, new medications, etc.  THN CM Care Plan Problem One   Flowsheet Row Most Recent Value  Care Plan Problem One  Knowledge Deficits related to self health management of CHF  Role Documenting the Problem One  Care Management [place] for Problem One  Active  THN Long Term Goal (31-90 days)  Over the next 60 days, patient will verbalize understanding of plan of care for self health management of CHF  THN Long Term Goal Start Date  04/28/16  Interventions for Problem One Long Term Goal  RN CM reviewed importance of daily weights and correlation to CHF,  praised and encouraged pt for weighing daily, this is ongoing goal at present.  THN CM Short Term Goal #1 (0-30 days)  Pt will have all medications and take as prescribed over next 30 days.  THN CM Short Term Goal #1 Start Date  03/24/16  THN CM Short Term Goal #1 Met Date  04/28/16  THN CM Short Term Goal #2 (0-30 days)  Over the next 30 days, patient will weigh daily, record, and call provider for weight gain of 3# overnight or 5# in a week  THN CM Short Term Goal #2 Start Date  04/28/16  [facility] CM Short Term Goal #2 Met Date  05/18/16  Interventions for Short Term Goal #2  Reinforced daily weights and importance.  THN CM Short Term Goal #3 (0-30 days)  Over the next 30 days, patient will take all medications as prescribed as evidenced by patient/family report of same  [facility] CM Short Term Goal #3 Start Date  02/21/16  [facility] CM Short Term Goal #3 Met Date  04/28/16    [facility] CM Care Plan Problem Two   Flowsheet Row Most Recent Value  Care Plan Problem Two  New edema right foot related to probable gout  Role  Documenting the Problem Two  Care Management Coordinator  Care Plan for Problem Two  Active  THN CM Short Term Goal #1 (0-30 days)  Pt will follow up with MD regarding recent blood work, Production manager and discuss with MDany new diagnosis, treatment, new medications  THN CM Short Term Goal #1 Start Date  05/18/16      Plan: see pt for home visit in March Assess weight, edema Follow up on diagnosis of gout  Jacqlyn Larsen [facility], [place] Coordinator [phone]

## 2016-05-19 DIAGNOSIS — J441 Chronic obstructive pulmonary disease with (acute) exacerbation: Secondary | ICD-10-CM | POA: Diagnosis not present

## 2016-05-19 DIAGNOSIS — I130 Hypertensive heart and chronic kidney disease with heart failure and stage 1 through stage 4 chronic kidney disease, or unspecified chronic kidney disease: Secondary | ICD-10-CM | POA: Diagnosis not present

## 2016-05-19 DIAGNOSIS — I480 Paroxysmal atrial fibrillation: Secondary | ICD-10-CM | POA: Diagnosis not present

## 2016-05-19 DIAGNOSIS — N183 Chronic kidney disease, stage 3 (moderate): Secondary | ICD-10-CM | POA: Diagnosis not present

## 2016-05-19 DIAGNOSIS — I25119 Atherosclerotic heart disease of native coronary artery with unspecified angina pectoris: Secondary | ICD-10-CM | POA: Diagnosis not present

## 2016-05-19 DIAGNOSIS — I5031 Acute diastolic (congestive) heart failure: Secondary | ICD-10-CM | POA: Diagnosis not present

## 2016-05-22 DIAGNOSIS — R748 Abnormal levels of other serum enzymes: Secondary | ICD-10-CM | POA: Diagnosis not present

## 2016-05-22 DIAGNOSIS — R945 Abnormal results of liver function studies: Secondary | ICD-10-CM | POA: Diagnosis not present

## 2016-05-22 DIAGNOSIS — R932 Abnormal findings on diagnostic imaging of liver and biliary tract: Secondary | ICD-10-CM | POA: Diagnosis not present

## 2016-05-22 DIAGNOSIS — Z9049 Acquired absence of other specified parts of digestive tract: Secondary | ICD-10-CM | POA: Diagnosis not present

## 2016-05-23 DIAGNOSIS — J441 Chronic obstructive pulmonary disease with (acute) exacerbation: Secondary | ICD-10-CM | POA: Diagnosis not present

## 2016-05-23 DIAGNOSIS — I130 Hypertensive heart and chronic kidney disease with heart failure and stage 1 through stage 4 chronic kidney disease, or unspecified chronic kidney disease: Secondary | ICD-10-CM | POA: Diagnosis not present

## 2016-05-23 DIAGNOSIS — I480 Paroxysmal atrial fibrillation: Secondary | ICD-10-CM | POA: Diagnosis not present

## 2016-05-23 DIAGNOSIS — I25119 Atherosclerotic heart disease of native coronary artery with unspecified angina pectoris: Secondary | ICD-10-CM | POA: Diagnosis not present

## 2016-05-23 DIAGNOSIS — I5031 Acute diastolic (congestive) heart failure: Secondary | ICD-10-CM | POA: Diagnosis not present

## 2016-05-23 DIAGNOSIS — N183 Chronic kidney disease, stage 3 (moderate): Secondary | ICD-10-CM | POA: Diagnosis not present

## 2016-05-25 DIAGNOSIS — I25119 Atherosclerotic heart disease of native coronary artery with unspecified angina pectoris: Secondary | ICD-10-CM | POA: Diagnosis not present

## 2016-05-25 DIAGNOSIS — N183 Chronic kidney disease, stage 3 (moderate): Secondary | ICD-10-CM | POA: Diagnosis not present

## 2016-05-25 DIAGNOSIS — I130 Hypertensive heart and chronic kidney disease with heart failure and stage 1 through stage 4 chronic kidney disease, or unspecified chronic kidney disease: Secondary | ICD-10-CM | POA: Diagnosis not present

## 2016-05-25 DIAGNOSIS — I480 Paroxysmal atrial fibrillation: Secondary | ICD-10-CM | POA: Diagnosis not present

## 2016-05-25 DIAGNOSIS — J441 Chronic obstructive pulmonary disease with (acute) exacerbation: Secondary | ICD-10-CM | POA: Diagnosis not present

## 2016-05-25 DIAGNOSIS — I5031 Acute diastolic (congestive) heart failure: Secondary | ICD-10-CM | POA: Diagnosis not present

## 2016-05-26 ENCOUNTER — Ambulatory Visit (INDEPENDENT_AMBULATORY_CARE_PROVIDER_SITE_OTHER): Payer: Medicare Other | Admitting: Internal Medicine

## 2016-05-26 ENCOUNTER — Encounter (INDEPENDENT_AMBULATORY_CARE_PROVIDER_SITE_OTHER): Payer: Self-pay

## 2016-05-26 ENCOUNTER — Encounter: Payer: Self-pay | Admitting: Internal Medicine

## 2016-05-26 ENCOUNTER — Ambulatory Visit (INDEPENDENT_AMBULATORY_CARE_PROVIDER_SITE_OTHER)
Admission: RE | Admit: 2016-05-26 | Discharge: 2016-05-26 | Disposition: A | Discharge status: Home / Self Care | Payer: Medicare Other | Source: Ambulatory Visit | Attending: Internal Medicine | Admitting: Internal Medicine

## 2016-05-26 VITALS — BP 122/72 | HR 86 | Ht 61.0 in | Wt 153.2 lb

## 2016-05-26 DIAGNOSIS — R0602 Shortness of breath: Secondary | ICD-10-CM | POA: Diagnosis not present

## 2016-05-26 DIAGNOSIS — I25118 Atherosclerotic heart disease of native coronary artery with other forms of angina pectoris: Secondary | ICD-10-CM

## 2016-05-26 DIAGNOSIS — R05 Cough: Secondary | ICD-10-CM | POA: Diagnosis not present

## 2016-05-26 DIAGNOSIS — R059 Cough, unspecified: Secondary | ICD-10-CM | POA: Insufficient documentation

## 2016-05-26 MED ORDER — METHYLPREDNISOLONE ACETATE 40 MG/ML INJ SUSP (RADIOLOG
120.0000 mg | Freq: Once | INTRAMUSCULAR | Status: AC
  Administered 2016-05-26: 120 mg via INTRAMUSCULAR

## 2016-05-26 NOTE — Progress Notes (Signed)
Subjective:    Patient ID: [name], female    DOB: [birth year]   MRN: [id number]    Brief patient profile:  70 yowf healthy as child and good ex tol up to around 2000 then gradual onset  doe then stopped smoking 2003 some better to point where could walk around quarter mile track but stop x once typically  s any inhalers but heart attack 2006 and worse since then referred by Dr Quillian Quince to pulmonary clinic 02/12/2014   History of Present Illness   02/12/2014 1st [facility] office visit/ [name]   Chief Complaint  Patient presents with  . Pulmonary Consult    Referred by Dr. Phill Mutter. Pt c/o dyspnea since 2007 "when started having heart trouble"- worse since Aug 2015. She states she is SOB all of the time- with or without exertion. She has to sleep propped up on 3 pillows.  She also c/o some hemoptysis recently- relates to sinus congestion and PND.   since Aug 2015 sob at rest sense can't get a breath in worse with walking 50 ft  No better with albuterol  Wt in 2000 was 145  Assoc with dysphagia  not on acei or gerd rx  10 min on ex bike and then legs give out  rec Weight control is simply a matter of calorie balance   GERD (REFLUX)   Take protonix 40mg  Take 30-60 min before first meal of the day and add pepcid ac 20 mg after supper to see if helps breathing or coughing or both >  ? Improved (can't remember)       05/26/2016  Extended f/u ov/[name] re:re establish re NEW  cough x November 2017/  f/u in chf clinic not on ACEi  Chief Complaint  Patient presents with  . Follow-up    dyspnea not getting better, cough with crackling, she is unable to produce mucus but when she does it is white/yellow in color  on duoneb tid which seems to help breathing and coughing some Onset was insidious, pattern is progressively worse now 24/7 cough and prior doe now sob at rest if coughing  Worse time for is  hs but even then very minimal mucus production  Very confused with meds/  instructions/ cannot do accurate med reconciliatoin   No obvious day to day or daytime variability or assoc mucus plugs or hemoptysis or cp or chest tightness, subjective wheeze or overt sinus or hb symptoms. No unusual exp hx or h/o childhood pna/ asthma or knowledge of premature birth.    Also denies any obvious fluctuation of symptoms with weather or environmental changes or other aggravating or alleviating factors except as outlined above   Current Medications, Allergies, Complete Past Medical History, Past Surgical History, Family History, and Social History were reviewed in [employer] record.  ROS  The following are not active complaints unless bolded sore throat, dysphagia, dental problems, itching, sneezing,  nasal congestion or excess/ purulent secretions, ear ache,   fever, chills, sweats, unintended wt loss, classically pleuritic or exertional cp,  orthopnea pnd or leg swelling, presyncope, palpitations, abdominal pain, anorexia, nausea, vomiting, diarrhea  or change in bowel or bladder habits, change in stools or urine, dysuria,hematuria,  rash, arthralgias, visual complaints, headache, numbness, weakness or ataxia or problems with walking or coordination,  change in mood/affect or memory.               Objective:   Physical Exam  05/27/2016  153 02/12/14 190 lb (86.183 kg)  01/13/14 184 lb 3.2 oz (83.553 kg)  12/23/13 184 lb (83.462 kg)     Obese wf nad with congested sounding cough / vital signs reviewed - Note on arrival 02 sats  92% on RA    HEENT: edentulous, nl turbinates, and orophanx. Nl external ear canals without cough reflex   NECK :  without JVD/Nodes/TM/ nl carotid upstrokes bilaterally   LUNGS: no acc muscle use, clear to A and P bilaterally without cough on insp or exp maneuvers   CV:  RRR  no s3 or murmur or increase in P2, no edema   ABD: obese/ soft and nontender with nl excursion in the supine position. No bruits or  organomegaly, bowel sounds nl  MS:  warm without deformities, calf tenderness, cyanosis or clubbing  SKIN: warm and dry without lesions    NEURO:  alert, approp, no deficits   CXR PA and Lateral:   05/26/2016 :    I personally reviewed images and agree with radiology impression as follows:   Resolved lower lobe opacity since January.  No new cardiopulmonary abnormality. Calcified aortic atherosclerosis.                       Assessment & Plan:

## 2016-05-26 NOTE — Patient Instructions (Addendum)
Depomedrol 120 mg IM today   For cough try mucinex 1200 mg every 12 hours   Pantoprazole (protonix) 40 mg   Take  30-60 min before first meal of the day and Pepcid (famotidine)  20 mg one @  bedtime until return to office - this is the best way to tell whether stomach acid is contributing to your problem.    GERD (REFLUX)  is an extremely common cause of respiratory symptoms just like yours , many times with no obvious heartburn at all.    It can be treated with medication, but also with lifestyle changes including elevation of the head of your bed (ideally with 6 inch  bed blocks),  Smoking cessation, avoidance of late meals, excessive alcohol, and avoid fatty foods, chocolate, peppermint, colas, red wine, and acidic juices such as orange juice.  NO MINT OR MENTHOL PRODUCTS SO NO COUGH DROPS   USE SUGARLESS CANDY INSTEAD (Jolley ranchers or Stover's or Life Savers) or even ice chips will also do - the key is to swallow to prevent all throat clearing. NO OIL BASED VITAMINS - use powdered substitutes.  Please remember to go to the x-ray department downstairs in the basement  for your tests - we will call you with the results when they are available.  If not better return with all medications in hand

## 2016-05-27 NOTE — Assessment & Plan Note (Signed)
See same ddx under cough as it appears chronic sob only worsened when cough developed

## 2016-05-27 NOTE — Assessment & Plan Note (Signed)
- 02/12/2014  Walked RA x 1 lap  @ 185 ft each stopped due to  Sob, no desat, moderate pace  - 02/12/2014 spirometry >   FEV1  1.41 (83%) ratio 83 - 05/26/2016 rec max gerd rx/ return with all meds if not improving   Symptoms are markedly disproportionate to objective findings and not clear this is a lung problem but pt does appear to have difficult airway management issues. DDX of  difficult airways management almost all start with A and  include Adherence, Ace Inhibitors, Acid Reflux, Active Sinus Disease, Alpha 1 Antitripsin deficiency, Anxiety masquerading as Airways dz,  ABPA,  Allergy(esp in young), Aspiration (esp in elderly), Adverse effects of meds,  Active smokers, A bunch of PE's (a small clot burden can't cause this syndrome unless there is already severe underlying pulm or vascular dz with poor reserve) plus two Bs  = Bronchiectasis and Beta blocker use..and one C= CHF  Adherence is always the initial "prime suspect" and is a multilayered concern that requires a "trust but verify" approach in every patient - starting with knowing how to use medications, especially inhalers, correctly, keeping up with refills and understanding the fundamental difference between maintenance and prns vs those medications only taken for a very short course and then stopped and not refilled.  - if not better needs to return with all meds in hand using a trust but verify approach to confirm accurate Medication  Reconciliation The principal here is that until we are certain that the  patients are doing what we've asked, it makes no sense to ask them to do more.   ? Acid (or non-acid) GERD > always difficult to exclude as up to 75% of pts in some series report no assoc GI/ Heartburn symptoms> rec max (24h)  acid suppression and diet restrictions/ reviewed and instructions given in writing.   ? Allergy/asthmatic component > Depomedrol 120 IM / continue duoneb prn   ? Active sinus dz > continue flonase, consider Sinus  CT next   ? A bunch of PE's > unlikley on eliquis  ? Anxiety > usually at the bottom of this list of usual suspects but should be much higher on this pt's based on H and P and note already on psychotropics > may be interfering with med adherence/ reconciliation issues  ? BB > may need to consider bisoprolol   ? CHF? Cardiac asthma  >   Echo 12/21/26 Systolic function was moderately to   severely reduced. The estimated ejection fraction was in the   range of 30% to 35%. Wall motion was normal; there were no   regional wall motion abnormalities. - Aortic valve: Mildly calcified annulus. Trileaflet; normal   thickness, mildly calcified leaflets. There was mild   regurgitation. - Mitral valve: There was moderate regurgitation. - Left atrium: The atrium was moderately dilated. - Pulmonary arteries: Systolic pressure was mildly increased. PA   peak pressure: 38 mm Hg (S). Note Last bnp trending up   > f/u chf clinic    I had an extended discussion with the patient reviewing all relevant studies completed to date and  lasting 25 minutes of a 40  minute office visit to re-establish   Re refractory   non-specific but potentially very serious respiratory/chest symptoms of unknown etiology.  Each maintenance medication was reviewed in detail including most importantly the difference between maintenance and prns and under what circumstances the prns are to be triggered using an action plan format that is  not reflected in the computer generated alphabetically organized AVS.    Please see AVS for specific instructions unique to this office visit that I personally wrote and verbalized to the the pt in detail and then reviewed with pt  by my nurse highlighting any  changes in therapy recommended at today's visit to their plan of care.

## 2016-05-29 ENCOUNTER — Other Ambulatory Visit (HOSPITAL_COMMUNITY): Payer: Self-pay | Admitting: Physician Assistant

## 2016-05-29 DIAGNOSIS — I130 Hypertensive heart and chronic kidney disease with heart failure and stage 1 through stage 4 chronic kidney disease, or unspecified chronic kidney disease: Secondary | ICD-10-CM | POA: Diagnosis not present

## 2016-05-29 DIAGNOSIS — I5031 Acute diastolic (congestive) heart failure: Secondary | ICD-10-CM | POA: Diagnosis not present

## 2016-05-29 DIAGNOSIS — J441 Chronic obstructive pulmonary disease with (acute) exacerbation: Secondary | ICD-10-CM | POA: Diagnosis not present

## 2016-05-29 DIAGNOSIS — R160 Hepatomegaly, not elsewhere classified: Secondary | ICD-10-CM

## 2016-05-29 DIAGNOSIS — N183 Chronic kidney disease, stage 3 (moderate): Secondary | ICD-10-CM | POA: Diagnosis not present

## 2016-05-29 DIAGNOSIS — I25119 Atherosclerotic heart disease of native coronary artery with unspecified angina pectoris: Secondary | ICD-10-CM | POA: Diagnosis not present

## 2016-05-29 DIAGNOSIS — I480 Paroxysmal atrial fibrillation: Secondary | ICD-10-CM | POA: Diagnosis not present

## 2016-05-29 NOTE — Progress Notes (Signed)
Spoke with pt's daughter and notified of results per Dr. Wert. She verbalized understanding and denied any questions. 

## 2016-05-30 DIAGNOSIS — I480 Paroxysmal atrial fibrillation: Secondary | ICD-10-CM | POA: Diagnosis not present

## 2016-05-30 DIAGNOSIS — I25119 Atherosclerotic heart disease of native coronary artery with unspecified angina pectoris: Secondary | ICD-10-CM | POA: Diagnosis not present

## 2016-05-30 DIAGNOSIS — N183 Chronic kidney disease, stage 3 (moderate): Secondary | ICD-10-CM | POA: Diagnosis not present

## 2016-05-30 DIAGNOSIS — I130 Hypertensive heart and chronic kidney disease with heart failure and stage 1 through stage 4 chronic kidney disease, or unspecified chronic kidney disease: Secondary | ICD-10-CM | POA: Diagnosis not present

## 2016-05-30 DIAGNOSIS — J441 Chronic obstructive pulmonary disease with (acute) exacerbation: Secondary | ICD-10-CM | POA: Diagnosis not present

## 2016-05-30 DIAGNOSIS — I5031 Acute diastolic (congestive) heart failure: Secondary | ICD-10-CM | POA: Diagnosis not present

## 2016-05-31 ENCOUNTER — Ambulatory Visit: Payer: Medicare Other | Admitting: Nurse Practitioner

## 2016-05-31 DIAGNOSIS — J441 Chronic obstructive pulmonary disease with (acute) exacerbation: Secondary | ICD-10-CM | POA: Diagnosis not present

## 2016-05-31 DIAGNOSIS — I5031 Acute diastolic (congestive) heart failure: Secondary | ICD-10-CM | POA: Diagnosis not present

## 2016-05-31 DIAGNOSIS — I130 Hypertensive heart and chronic kidney disease with heart failure and stage 1 through stage 4 chronic kidney disease, or unspecified chronic kidney disease: Secondary | ICD-10-CM | POA: Diagnosis not present

## 2016-05-31 DIAGNOSIS — N183 Chronic kidney disease, stage 3 (moderate): Secondary | ICD-10-CM | POA: Diagnosis not present

## 2016-05-31 DIAGNOSIS — I480 Paroxysmal atrial fibrillation: Secondary | ICD-10-CM | POA: Diagnosis not present

## 2016-05-31 DIAGNOSIS — I25119 Atherosclerotic heart disease of native coronary artery with unspecified angina pectoris: Secondary | ICD-10-CM | POA: Diagnosis not present

## 2016-06-02 DIAGNOSIS — J441 Chronic obstructive pulmonary disease with (acute) exacerbation: Secondary | ICD-10-CM | POA: Diagnosis not present

## 2016-06-02 DIAGNOSIS — I480 Paroxysmal atrial fibrillation: Secondary | ICD-10-CM | POA: Diagnosis not present

## 2016-06-02 DIAGNOSIS — I5031 Acute diastolic (congestive) heart failure: Secondary | ICD-10-CM | POA: Diagnosis not present

## 2016-06-02 DIAGNOSIS — I25119 Atherosclerotic heart disease of native coronary artery with unspecified angina pectoris: Secondary | ICD-10-CM | POA: Diagnosis not present

## 2016-06-02 DIAGNOSIS — I130 Hypertensive heart and chronic kidney disease with heart failure and stage 1 through stage 4 chronic kidney disease, or unspecified chronic kidney disease: Secondary | ICD-10-CM | POA: Diagnosis not present

## 2016-06-02 DIAGNOSIS — N183 Chronic kidney disease, stage 3 (moderate): Secondary | ICD-10-CM | POA: Diagnosis not present

## 2016-06-05 ENCOUNTER — Ambulatory Visit (HOSPITAL_COMMUNITY)
Admission: RE | Admit: 2016-06-05 | Discharge: 2016-06-05 | Disposition: A | Discharge status: Home / Self Care | Payer: Medicare Other | Source: Ambulatory Visit | Attending: Physician Assistant | Admitting: Physician Assistant

## 2016-06-05 DIAGNOSIS — Z9049 Acquired absence of other specified parts of digestive tract: Secondary | ICD-10-CM | POA: Insufficient documentation

## 2016-06-05 DIAGNOSIS — R160 Hepatomegaly, not elsewhere classified: Secondary | ICD-10-CM | POA: Diagnosis not present

## 2016-06-05 DIAGNOSIS — N281 Cyst of kidney, acquired: Secondary | ICD-10-CM | POA: Diagnosis not present

## 2016-06-05 DIAGNOSIS — R7989 Other specified abnormal findings of blood chemistry: Secondary | ICD-10-CM | POA: Diagnosis not present

## 2016-06-05 LAB — POCT I-STAT CREATININE: CREATININE: 1.3 mg/dL — AB (ref 0.44–1.00)

## 2016-06-05 MED ORDER — GADOBENATE DIMEGLUMINE 529 MG/ML IV SOLN
15.0000 mL | Freq: Once | INTRAVENOUS | Status: AC | PRN
  Administered 2016-06-05: 7 mL via INTRAVENOUS

## 2016-06-07 DIAGNOSIS — E876 Hypokalemia: Secondary | ICD-10-CM | POA: Diagnosis not present

## 2016-06-07 DIAGNOSIS — D649 Anemia, unspecified: Secondary | ICD-10-CM | POA: Diagnosis not present

## 2016-06-07 DIAGNOSIS — M545 Low back pain: Secondary | ICD-10-CM | POA: Diagnosis not present

## 2016-06-07 DIAGNOSIS — N183 Chronic kidney disease, stage 3 (moderate): Secondary | ICD-10-CM | POA: Diagnosis not present

## 2016-06-07 DIAGNOSIS — I5033 Acute on chronic diastolic (congestive) heart failure: Secondary | ICD-10-CM | POA: Diagnosis not present

## 2016-06-07 DIAGNOSIS — J440 Chronic obstructive pulmonary disease with acute lower respiratory infection: Secondary | ICD-10-CM | POA: Diagnosis not present

## 2016-06-07 DIAGNOSIS — Z6828 Body mass index (BMI) 28.0-28.9, adult: Secondary | ICD-10-CM | POA: Diagnosis not present

## 2016-06-07 DIAGNOSIS — I5031 Acute diastolic (congestive) heart failure: Secondary | ICD-10-CM | POA: Diagnosis not present

## 2016-06-07 DIAGNOSIS — F419 Anxiety disorder, unspecified: Secondary | ICD-10-CM | POA: Diagnosis not present

## 2016-06-07 DIAGNOSIS — I480 Paroxysmal atrial fibrillation: Secondary | ICD-10-CM | POA: Diagnosis not present

## 2016-06-07 DIAGNOSIS — J441 Chronic obstructive pulmonary disease with (acute) exacerbation: Secondary | ICD-10-CM | POA: Diagnosis not present

## 2016-06-07 DIAGNOSIS — I130 Hypertensive heart and chronic kidney disease with heart failure and stage 1 through stage 4 chronic kidney disease, or unspecified chronic kidney disease: Secondary | ICD-10-CM | POA: Diagnosis not present

## 2016-06-07 DIAGNOSIS — I25119 Atherosclerotic heart disease of native coronary artery with unspecified angina pectoris: Secondary | ICD-10-CM | POA: Diagnosis not present

## 2016-06-07 DIAGNOSIS — I482 Chronic atrial fibrillation: Secondary | ICD-10-CM | POA: Diagnosis not present

## 2016-06-09 DIAGNOSIS — I25119 Atherosclerotic heart disease of native coronary artery with unspecified angina pectoris: Secondary | ICD-10-CM | POA: Diagnosis not present

## 2016-06-09 DIAGNOSIS — I5031 Acute diastolic (congestive) heart failure: Secondary | ICD-10-CM | POA: Diagnosis not present

## 2016-06-09 DIAGNOSIS — J441 Chronic obstructive pulmonary disease with (acute) exacerbation: Secondary | ICD-10-CM | POA: Diagnosis not present

## 2016-06-09 DIAGNOSIS — I480 Paroxysmal atrial fibrillation: Secondary | ICD-10-CM | POA: Diagnosis not present

## 2016-06-09 DIAGNOSIS — N183 Chronic kidney disease, stage 3 (moderate): Secondary | ICD-10-CM | POA: Diagnosis not present

## 2016-06-09 DIAGNOSIS — I130 Hypertensive heart and chronic kidney disease with heart failure and stage 1 through stage 4 chronic kidney disease, or unspecified chronic kidney disease: Secondary | ICD-10-CM | POA: Diagnosis not present

## 2016-06-12 ENCOUNTER — Encounter: Payer: Self-pay | Admitting: Cardiovascular Disease

## 2016-06-12 ENCOUNTER — Ambulatory Visit (INDEPENDENT_AMBULATORY_CARE_PROVIDER_SITE_OTHER): Payer: Medicare Other | Admitting: Cardiovascular Disease

## 2016-06-12 ENCOUNTER — Other Ambulatory Visit (HOSPITAL_COMMUNITY)
Admission: RE | Admit: 2016-06-12 | Discharge: 2016-06-12 | Disposition: A | Discharge status: Home / Self Care | Payer: Medicare Other | Source: Ambulatory Visit | Attending: Cardiovascular Disease | Admitting: Cardiovascular Disease

## 2016-06-12 VITALS — BP 112/64 | HR 124 | Ht 61.0 in | Wt 151.0 lb

## 2016-06-12 DIAGNOSIS — I5022 Chronic systolic (congestive) heart failure: Secondary | ICD-10-CM | POA: Diagnosis not present

## 2016-06-12 DIAGNOSIS — I481 Persistent atrial fibrillation: Secondary | ICD-10-CM

## 2016-06-12 DIAGNOSIS — I25118 Atherosclerotic heart disease of native coronary artery with other forms of angina pectoris: Secondary | ICD-10-CM | POA: Diagnosis not present

## 2016-06-12 DIAGNOSIS — I4819 Other persistent atrial fibrillation: Secondary | ICD-10-CM

## 2016-06-12 DIAGNOSIS — E7800 Pure hypercholesterolemia, unspecified: Secondary | ICD-10-CM

## 2016-06-12 DIAGNOSIS — I209 Angina pectoris, unspecified: Secondary | ICD-10-CM

## 2016-06-12 DIAGNOSIS — N183 Chronic kidney disease, stage 3 (moderate): Secondary | ICD-10-CM | POA: Diagnosis not present

## 2016-06-12 DIAGNOSIS — I10 Essential (primary) hypertension: Secondary | ICD-10-CM

## 2016-06-12 DIAGNOSIS — I5031 Acute diastolic (congestive) heart failure: Secondary | ICD-10-CM | POA: Diagnosis not present

## 2016-06-12 DIAGNOSIS — Z79899 Other long term (current) drug therapy: Secondary | ICD-10-CM | POA: Diagnosis not present

## 2016-06-12 DIAGNOSIS — I25119 Atherosclerotic heart disease of native coronary artery with unspecified angina pectoris: Secondary | ICD-10-CM | POA: Diagnosis not present

## 2016-06-12 DIAGNOSIS — J441 Chronic obstructive pulmonary disease with (acute) exacerbation: Secondary | ICD-10-CM | POA: Diagnosis not present

## 2016-06-12 DIAGNOSIS — I130 Hypertensive heart and chronic kidney disease with heart failure and stage 1 through stage 4 chronic kidney disease, or unspecified chronic kidney disease: Secondary | ICD-10-CM | POA: Diagnosis not present

## 2016-06-12 DIAGNOSIS — I480 Paroxysmal atrial fibrillation: Secondary | ICD-10-CM | POA: Diagnosis not present

## 2016-06-12 LAB — MAGNESIUM: Magnesium: 1.8 mg/dL (ref 1.7–2.4)

## 2016-06-12 MED ORDER — METOPROLOL SUCCINATE ER 50 MG PO TB24: ORAL_TABLET | ORAL | 3 refills | Status: DC

## 2016-06-12 NOTE — Patient Instructions (Signed)
Your physician recommends that you schedule a follow-up appointment in: 3 weeks with M Lenze PA-C    INCREASE Toprol XL to 75 mg daily (take 1 1/2 tablets of the 50 mg )    Get lab work now      Thank you for [name] !

## 2016-06-12 NOTE — Progress Notes (Signed)
SUBJECTIVE: The patient presents for follow-up of acute on chronic systolic heart failure. Weight is 151 pounds today, 156 pounds on 04/27/16.  She is feeling better today. She has occasional chest pains radiating into the back which last seconds. She has exertional dyspnea when walking 25 steps.  She has chronic back pain and may need surgery in April.  Echocardiogram 04/17/16:  - Left ventricle: The cavity size was normal. There was mild concentric hypertrophy. Systolic function was moderately to severely reduced. The estimated ejection fraction was in the range of 30% to 35%. Wall motion was normal; there were no regional wall motion abnormalities. - Aortic valve: Mildly calcified annulus. Trileaflet; normal thickness, mildly calcified leaflets. There was mild regurgitation. - Mitral valve: There was moderate regurgitation. - Left atrium: The atrium was moderately dilated. - Pulmonary arteries: Systolic pressure was mildly increased. PA peak pressure: 38 mm Hg (S). - Pericardium, extracardiac: A small pericardial effusion was identified.  This is a decline in LVEF which was previously 55% on 12/17/15.  She underwent percutaneous coronary intervention with drug-eluting stent to the left circumflex coronary artery in January 2015. Coronary angiography 12/16/15 showed 20% stenosis in the proximal LAD and in the inferior branch of the first diagonal with a widely patent previously placed circumflex stent. RCA was small and nondominant with 80% ostial stenosis. Medical therapy was recommended.     Review of Systems: As per "subjective", otherwise negative.  Allergies  Allergen Reactions  . Cephalosporins Diarrhea and Nausea Only    Lightheaded  . Levaquin [Levofloxacin In D5w] Swelling  . Macrodantin [Nitrofurantoin Macrocrystal] Swelling  . Phenothiazines Anaphylaxis and Hives  . Polysorbate Anaphylaxis  . Prednisone Shortness Of Breath  . Buspirone  Itching  . Cardura [Doxazosin Mesylate] Itching  . Codeine Itching  . Acyclovir And Related   . Prochlorperazine Other (See Comments)    unknown  . Ranexa [Ranolazine]     Severe drop in BP  . Atorvastatin Hives    Cramping; tolerates Crestor ok  . Ofloxacin Rash  . Other Itching and Rash    "WOOL"= make skin look like it has been burned  . Penicillins Other (See Comments)    Causes redness all over. Has patient had a PCN reaction causing immediate rash, facial/tongue/throat swelling, SOB or lightheadedness with hypotension: No Has patient had a PCN reaction causing severe rash involving mucus membranes or skin necrosis: No Has patient had a PCN reaction that required hospitalization No Has patient had a PCN reaction occurring within the last 10 years: No If all of the above answers are "NO", then may proceed with Cephalosporin use.   . Pimozide Hives and Itching    Current Outpatient Prescriptions  Medication Sig Dispense Refill  . acetaminophen (TYLENOL) 500 MG tablet Take 500 mg by mouth daily as needed for headache.     . ALPRAZolam (XANAX) 0.25 MG tablet Take 0.25 mg by mouth at bedtime as needed for anxiety.    [name] Kitchen apixaban (ELIQUIS) 5 MG TABS tablet Take 1 tablet (5 mg total) by mouth 2 (two) times daily. 60 tablet 6  . bisacodyl (DULCOLAX) 5 MG EC tablet Take 1 tablet (5 mg total) by mouth daily as needed for moderate constipation. 30 tablet 0  . DULoxetine (CYMBALTA) 60 MG capsule Take 60 mg by mouth at bedtime.     . fluticasone (FLONASE) 50 MCG/ACT nasal spray Place 2 sprays into both nostrils daily.     . furosemide (LASIX) 40 MG  tablet TAKE 40 MG TWICE A DAY FOR 3 DAYS, THEN TAKE 40 MG DAILY FROM THEN ON 90 tablet 3  . gabapentin (NEURONTIN) 300 MG capsule Take 1 capsule (300 mg total) by mouth 3 (three) times daily. 90 capsule 2  . hydrocortisone (ANUSOL-HC) 2.5 % rectal cream Place 1 application rectally 2 (two) times daily. 30 g 1  . hydrocortisone (ANUSOL-HC) 25 MG  suppository Place 1 suppository (25 mg total) rectally every 12 (twelve) hours. 12 suppository 1  . ipratropium-albuterol (DUONEB) 0.5-2.5 (3) MG/3ML SOLN Take 3 mLs by nebulization every 6 (six) hours. 360 mL 0  . Magnesium 400 MG TABS Take 400 mg by mouth daily. 90 tablet 3  . Melatonin 3 MG CAPS Take 1 capsule by mouth at bedtime.    . metoprolol succinate (TOPROL-XL) 50 MG 24 hr tablet Take 1 tablet (50 mg total) by mouth daily. Take with or immediately following a meal. 90 tablet 3  . nitroGLYCERIN (NITROSTAT) 0.4 MG SL tablet Place 0.4 mg under the tongue every 5 (five) minutes as needed for chest pain. Reported on 08/04/2015    . pantoprazole (PROTONIX) 40 MG tablet Take 40 mg by mouth every morning.      No current facility-administered medications for this visit.     Past Medical History:  Diagnosis Date  . Anxiety   . Arthritis   . Bursitis    left shoulder  . Cataract   . CHF (congestive heart failure) (HCC)    EF 55-60%  . Complication of anesthesia   . COPD (chronic obstructive pulmonary disease) ([place])   . Coronary atherosclerosis of native coronary artery    a. DES to CX, moderately severe stenosis RCA, mild stenosis LAD 04/2013  . Depression   . Dysphagia, unspecified(787.20)   . GERD (gastroesophageal reflux disease)    Hx Schatzki's ring, multiple EGD/ED last 01/06/2004  . Headache   . Heart disease   . Heart murmur    'a littel'  . HTN (hypertension)    Hx of it  . Hyperlipemia   . Hyperlipidemia   . Internal hemorrhoids without mention of complication   . MI (myocardial infarction) 2006  . Microscopic colitis 2003  . Other and unspecified hyperlipidemia   . Panic disorder without agoraphobia   . Paresthesia    hands, feet  . Pneumonia 12/2011  . PONV (postoperative nausea and vomiting)    'a little nausea"  . PVD (peripheral vascular disease) ([place])   . S/P colonoscopy 09/27/2001   internal hemorrhoids, desc colon inflam polyp, SB BX-chronic duodenitis,  colitis  . Shortness of breath   . Sleep disorder    obstructive  . Thyroid disease    recent abnl TSH per pt    Past Surgical History:  Procedure Laterality Date  . ABDOMINAL HYSTERECTOMY    . ANTERIOR AND POSTERIOR REPAIR     with resection of vagina  . APPENDECTOMY    . BACK SURGERY    . BIOPSY  07/05/2015   Procedure: BIOPSY;  Surgeon: Daneil Dolin, MD;  [facility]: [facility];  Service: Endoscopy;;  gastric polyp biopsy, ascending colon biopsy  . BLADDER SUSPENSION  11/09/2011   Procedure: TRANSVAGINAL TAPE (TVT) PROCEDURE;  Surgeon: Marissa Nestle, MD;  Location: [facility] ORS;  Service: Urology;  Laterality: N/A;  . bladder tack  06/2010  . BREAST LUMPECTOMY  1998   left, benign  . CARDIAC CATHETERIZATION    . CARDIAC CATHETERIZATION    . CARDIAC  CATHETERIZATION N/A 12/16/2015   Procedure: Left Heart Cath and Coronary Angiography;  Surgeon: Troy Sine, MD;  Location: [facility] CV LAB;  Service: Cardiovascular;  Laterality: N/A;  . [place]   left  . CHOLECYSTECTOMY  1998  . COLONOSCOPY  03/16/2011   multiple hyperplastic colon polyps, sigmoid diverticulosis, melanosis coli  . COLONOSCOPY WITH PROPOFOL N/A 07/05/2015   RMR:one 5 mm polyp in descending colon  . CORONARY ANGIOPLASTY WITH STENT PLACEMENT    . ESOPHAGEAL DILATION N/A 07/05/2015   Procedure: ESOPHAGEAL DILATION;  Surgeon: Daneil Dolin, MD;  [facility]: [facility];  Service: Endoscopy;  Laterality: N/A;  . ESOPHAGOGASTRODUODENOSCOPY (EGD) WITH PROPOFOL N/A 07/05/2015   SEG:BTDVVO  . JOINT REPLACEMENT Right 2007  . left hand surgery    . LEFT HEART CATHETERIZATION WITH CORONARY ANGIOGRAM N/A 05/14/2013   Procedure: LEFT HEART CATHETERIZATION WITH CORONARY ANGIOGRAM;  Surgeon: Blane Ohara, MD;  [facility] C[facility] LAB;  Service: Cardiovascular;  Laterality: N/A;  . left rotator cuff surgery    . LUMBAR LAMINECTOMY/DECOMPRESSION MICRODISCECTOMY N/A 10/11/2012   Procedure: LUMBAR  LAMINECTOMY/DECOMPRESSION MICRODISCECTOMY 2 LEVELS;  Surgeon: Floyce Stakes, MD;  Location: [facility]URO ORS;  Service: Neurosurgery;  Laterality: N/A;  L3-4 L4-5 Laminectomy  . LUMBAR WOUND DEBRIDEMENT N/A 09/27/2015   Procedure: Exploration of Lumbar Wound w/ Repair CSF Leak/Lumbar Drain Placement;  Surgeon: Leeroy Cha, MD;  Location: [place] [facility] NEURO ORS;  Service: Neurosurgery;  Laterality: N/A;  . MALONEY DILATION  03/16/2011   Gastritis. No H.pylori on bx. 12F maloney dilation with disruption of  occult cevical esophageal web  . NASAL SINUS SURGERY    . right knee replacement  2007  . right leg benign tumor    . SHOULDER SURGERY Left   . TONSILLECTOMY    . unspecified area, hysterectomy  1972   partial    Social History   Social History  . Marital status: Divorced    Spouse name: N/A  . Number of children: 5  . Years of education: N/A   Occupational History  . retired    Social History Main Topics  . Smoking status: Former Smoker    Packs/day: 1.00    Years: 64.00    Types: Cigarettes    Start date: 12/24/1947    Quit date: 11/17/2001  . Smokeless tobacco: Never Used     Comment: Quit smoking in 2003  . Alcohol use No  . Drug use: No  . Sexual activity: No   Other Topics Concern  . Not on file   Social History Narrative   Divorced.   Sister had colon perforation & died from complications in [place], [place]     Vitals:   06/12/16 1348  BP: 112/64  Pulse: (!) 124  SpO2: 98%  Weight: 151 lb (68.5 kg)  Height: 5\' 1"  (1.549 m)    PHYSICAL EXAM General: NAD HEENT: Normal. Neck: No JVD, no thyromegaly. Lungs: Bibasilar rhonchi, no rales. CV: Tachycardic, irregularrhythm, normal S1/S2, no S3, no murmur. No pretibial or periankle edema.  Abdomen: Soft, nontender, no distention.  Neurologic: Alert and oriented.  Psych: Normal affect.     ECG: Most recent ECG reviewed.      ASSESSMENT AND PLAN: 1. CAD with DES to LCx: Stable. Continue current therapywith  aspirin and Toprol-XL (increase to 75 mg daily).Statin intolerant.  2. Essential HTN: Controlled. Monitor given medication adjustments.  3. Chronic systolic heart failure, EF 30-35% (previously 55%): Euvolemic. Continue Lasix 40  mg daily. IncreaseToprol-XL to 75 mg daily. GFR 37 cc/min 04/23/16. Will hold off on ACEI/ARB. Possibly tachycardia-mediated. Single normal troponin while hospitalized. Wt 151 lbs today (156 on 04/27/16).  4. Hyperlipidemia: Statin intolerant.  5. Aortic regurgitation: Will monitor clinically and with surveillance echocardiograms. Mild at present.  6. Atrial fibrillation: Increase Toprol-XL to 75 mg for heart rate control. Continue Eliquis.   7. Hypomagnesemia: Now on supplementation. Will obtain magnesium level.  Dispo: fu 3 wks with PA   Kate Sable, M.D., F.A.C.C.

## 2016-06-15 ENCOUNTER — Encounter: Payer: Self-pay | Admitting: *Deleted

## 2016-06-15 ENCOUNTER — Other Ambulatory Visit: Payer: Self-pay | Admitting: *Deleted

## 2016-06-15 NOTE — Patient Outreach (Signed)
[facility]) Care Management   06/15/2016  [name] 1932/04/30 [id number]  [name] is an 83 y.o. female  Subjective: Routine home visit with pt, HIPAA verified, pt reports she is feeling well at present, does have tiredness at times and alternates activity with rest for energy conservation, continues to weigh daily, attend all appointments, states metoprolol increased to 1.5 tablets daily by cardiologist, saw pulmonary MD Dr. Melvyn Novas, denies vertigo, no new issues reported.  Objective:   Vitals:   06/15/16 1202  BP: 120/64  Pulse: 78  Resp: 16  SpO2: 95%  Weight: 148 lb 6.4 oz (67.3 kg)   ROS  Physical Exam  Constitutional: She is oriented to person, place, and time. She appears well-developed and well-nourished.  HENT:  Head: Normocephalic.  Neck: Normal range of motion. Neck supple.  Cardiovascular: Normal rate.   Irregular rhythm   Respiratory: Effort normal and breath sounds normal.  Dyspnea with exertion  GI: Soft. Bowel sounds are normal.  Musculoskeletal: Normal range of motion. She exhibits no edema.  Neurological: She is alert and oriented to person, place, and time.  Skin: Skin is warm and dry.  Psychiatric: She has a normal mood and affect. Her behavior is normal. Judgment and thought content normal.    Encounter Medications:   Outpatient Encounter Prescriptions as of 06/15/2016  Medication Sig Note  . acetaminophen (TYLENOL) 500 MG tablet Take 500 mg by mouth daily as needed for headache.    . ALPRAZolam (XANAX) 0.25 MG tablet Take 0.25 mg by mouth at bedtime as needed for anxiety.   [name] Kitchen apixaban (ELIQUIS) 5 MG TABS tablet Take 1 tablet (5 mg total) by mouth 2 (two) times daily.   . bisacodyl (DULCOLAX) 5 MG EC tablet Take 1 tablet (5 mg total) by mouth daily as needed for moderate constipation.   . DULoxetine (CYMBALTA) 60 MG capsule Take 60 mg by mouth at bedtime.    . fluticasone (FLONASE) 50 MCG/ACT nasal spray Place 2 sprays into  both nostrils daily.    . furosemide (LASIX) 40 MG tablet TAKE 40 MG TWICE A DAY FOR 3 DAYS, THEN TAKE 40 MG DAILY FROM THEN ON   . gabapentin (NEURONTIN) 300 MG capsule Take 1 capsule (300 mg total) by mouth 3 (three) times daily.   . hydrocortisone (ANUSOL-HC) 2.5 % rectal cream Place 1 application rectally 2 (two) times daily.   . hydrocortisone (ANUSOL-HC) 25 MG suppository Place 1 suppository (25 mg total) rectally every 12 (twelve) hours.   [name] Kitchen ipratropium-albuterol (DUONEB) 0.5-2.5 (3) MG/3ML SOLN Take 3 mLs by nebulization every 6 (six) hours.   . iron polysaccharides (NIFEREX) 150 MG capsule Take 150 mg by mouth daily. 06/15/2016: Pt reports she does not always take every day, especially if consstipated.  . Magnesium 400 MG TABS Take 400 mg by mouth daily.   . Melatonin 3 MG CAPS Take 1 capsule by mouth at bedtime.   . metoprolol succinate (TOPROL-XL) 50 MG 24 hr tablet Take 1 1/2 tablets (75 mg ) daily   . nitroGLYCERIN (NITROSTAT) 0.4 MG SL tablet Place 0.4 mg under the tongue every 5 (five) minutes as needed for chest pain. Reported on 08/04/2015 02/21/2016: On hand  . pantoprazole (PROTONIX) 40 MG tablet Take 40 mg by mouth every morning.     No facility-administered encounter medications on file as of 06/15/2016.     Functional Status:   In your present state of health, do you have any difficulty performing  the following activities: 04/15/2016 03/24/2016  Hearing? N N  Vision? N N  Difficulty concentrating or making decisions? N N  Walking or climbing stairs? N Y  Dressing or bathing? N N  Doing errands, shopping? N N  Preparing Food and eating ? - N  Using the Toilet? - N  In the past six months, have you accidently leaked urine? - N  Do you have problems with loss of bowel control? - Y  Managing your Medications? - N  Managing your Finances? - Y  Housekeeping or managing your Housekeeping? - Y  Some recent data might be hidden    Fall/Depression Screening:    PHQ 2/9 Scores  03/24/2016  PHQ - 2 Score 1    Assessment:  Pt is managing well at home with assistance of her family, verbalizes CHF action plan. RN CM discussed discharge plan with pt and pt agreeable with discharge, home health continues to see pt weekly.  Pt verbalizes understanding of using 24 hour nurse line. Patient has no further swelling in her foot and was not placed on medication for gout. RN CM faxed case closure letter to primary MD office- Tawni Carnes [facility], mailed case closure letter to patient.  [facility] CM Care Plan Problem One   Flowsheet Row Most Recent Value  Care Plan Problem One  Knowledge Deficits related to self health management of CHF  Role Documenting the Problem One  Care Management [place] for Problem One  Active  [facility] Long Term Goal (31-90 days)  Over the next 60 days, patient will verbalize understanding of plan of care for self health management of CHF  [facility] Long Term Goal Start Date  04/28/16  [facility] Long Term Goal Met Date  06/15/16  Interventions for Problem One Long Term Goal  RN CM reiterated importance of daily weights and correlation to CHF  [facility] CM Short Term Goal #1 (0-30 days)  Pt will have all medications and take as prescribed over next 30 days.  [facility] CM Short Term Goal #1 Start Date  03/24/16  [facility] CM Short Term Goal #1 Met Date  04/28/16  [facility] CM Short Term Goal #2 (0-30 days)  Over the next 30 days, patient will weigh daily, record, and call provider for weight gain of 3# overnight or 5# in a week  [facility] CM Short Term Goal #2 Start Date  04/28/16  [facility] CM Short Term Goal #2 Met Date  05/18/16  [facility] CM Short Term Goal #3 (0-30 days)  Over the next 30 days, patient will take all medications as prescribed as evidenced by patient/family report of same  [facility] CM Short Term Goal #3 Start Date  02/21/16  [facility] CM Short Term Goal #3 Met Date  04/28/16    [facility] CM Care Plan Problem Two   Flowsheet Row Most Recent Value  Care Plan Problem Two  New edema right foot related to  probable gout  Role Documenting the Problem Two  Care Management [place] for Problem Two  Active  [facility] CM Short Term Goal #1 (0-30 days)  Pt will follow up with MD regarding recent blood work, Production manager and discuss with MDany new diagnosis, treatment, new medications  [facility] CM Short Term Goal #1 Start Date  05/18/16  [facility] CM Short Term Goal #1 Met Date   06/15/16      Plan: Discharge today  Jacqlyn Larsen [facility], BSN [place] Coordinator [phone]

## 2016-06-16 DIAGNOSIS — N183 Chronic kidney disease, stage 3 (moderate): Secondary | ICD-10-CM | POA: Diagnosis not present

## 2016-06-16 DIAGNOSIS — J441 Chronic obstructive pulmonary disease with (acute) exacerbation: Secondary | ICD-10-CM | POA: Diagnosis not present

## 2016-06-16 DIAGNOSIS — I480 Paroxysmal atrial fibrillation: Secondary | ICD-10-CM | POA: Diagnosis not present

## 2016-06-16 DIAGNOSIS — I130 Hypertensive heart and chronic kidney disease with heart failure and stage 1 through stage 4 chronic kidney disease, or unspecified chronic kidney disease: Secondary | ICD-10-CM | POA: Diagnosis not present

## 2016-06-16 DIAGNOSIS — I5031 Acute diastolic (congestive) heart failure: Secondary | ICD-10-CM | POA: Diagnosis not present

## 2016-06-16 DIAGNOSIS — I25119 Atherosclerotic heart disease of native coronary artery with unspecified angina pectoris: Secondary | ICD-10-CM | POA: Diagnosis not present

## 2016-06-18 DIAGNOSIS — I482 Chronic atrial fibrillation, unspecified: Secondary | ICD-10-CM | POA: Insufficient documentation

## 2016-06-19 ENCOUNTER — Other Ambulatory Visit: Payer: Self-pay | Admitting: Cardiovascular Disease

## 2016-06-19 DIAGNOSIS — I5033 Acute on chronic diastolic (congestive) heart failure: Secondary | ICD-10-CM | POA: Diagnosis not present

## 2016-06-19 DIAGNOSIS — J440 Chronic obstructive pulmonary disease with acute lower respiratory infection: Secondary | ICD-10-CM | POA: Diagnosis not present

## 2016-06-19 DIAGNOSIS — I10 Essential (primary) hypertension: Secondary | ICD-10-CM | POA: Diagnosis not present

## 2016-06-19 DIAGNOSIS — F419 Anxiety disorder, unspecified: Secondary | ICD-10-CM | POA: Diagnosis not present

## 2016-06-19 DIAGNOSIS — M545 Low back pain: Secondary | ICD-10-CM | POA: Diagnosis not present

## 2016-06-19 DIAGNOSIS — I482 Chronic atrial fibrillation: Secondary | ICD-10-CM | POA: Diagnosis not present

## 2016-06-19 NOTE — Telephone Encounter (Signed)
Please give pt a call concerning a referral

## 2016-06-19 NOTE — Telephone Encounter (Signed)
Pt denies calling this morning.I told her not to worry and whoever called would call back.

## 2016-06-29 DIAGNOSIS — M5416 Radiculopathy, lumbar region: Secondary | ICD-10-CM | POA: Diagnosis not present

## 2016-06-29 DIAGNOSIS — M4316 Spondylolisthesis, lumbar region: Secondary | ICD-10-CM | POA: Diagnosis not present

## 2016-07-05 ENCOUNTER — Ambulatory Visit (INDEPENDENT_AMBULATORY_CARE_PROVIDER_SITE_OTHER): Payer: Medicare Other | Admitting: Physician Assistant

## 2016-07-05 ENCOUNTER — Encounter: Payer: Self-pay | Admitting: Physician Assistant

## 2016-07-05 VITALS — BP 128/62 | HR 91 | Ht 61.0 in | Wt 146.0 lb

## 2016-07-05 DIAGNOSIS — Z01818 Encounter for other preprocedural examination: Secondary | ICD-10-CM

## 2016-07-05 DIAGNOSIS — I2583 Coronary atherosclerosis due to lipid rich plaque: Secondary | ICD-10-CM

## 2016-07-05 DIAGNOSIS — I2510 Atherosclerotic heart disease of native coronary artery without angina pectoris: Secondary | ICD-10-CM

## 2016-07-05 DIAGNOSIS — I480 Paroxysmal atrial fibrillation: Secondary | ICD-10-CM | POA: Diagnosis not present

## 2016-07-05 DIAGNOSIS — I4729 Other ventricular tachycardia: Secondary | ICD-10-CM

## 2016-07-05 DIAGNOSIS — I10 Essential (primary) hypertension: Secondary | ICD-10-CM

## 2016-07-05 DIAGNOSIS — I472 Ventricular tachycardia: Secondary | ICD-10-CM

## 2016-07-05 NOTE — Patient Instructions (Signed)
Your physician recommends that you schedule a follow-up appointment in: 3 Months with Dr. Bronson Ing.  Your physician recommends that you continue on your current medications as directed. Please refer to the Current Medication list given to you today.  You have been cleared for surgery from a cardiac standpoint. Hold Eliquis 48 hours prior to surgery.   If you need a refill on your cardiac medications before your next appointment, please call your pharmacy.  Thank you for choosing [facility]!

## 2016-07-05 NOTE — Progress Notes (Signed)
Cardiology Office Note    Date:  07/05/2016   ID:  [name], DOB [birth year], MRN [id number]  PCP:  [name]  Cardiologist: Dr. Bronson Ing  Chief Complaint  Patient presents with  . Follow-up  . Pre-op Exam    History of Present Illness:  [name] is a 83 y.o. female with history of chronic systolic CHF LVEF 10-17% with moderate MR on echo 04/17/16. This is a decline in her LVEF which was previously 55% 12/17/15. She has history of CAD status post DES to the circumflex 04/2013, last cath 12/16/15 20% proximal LAD and in the inferior branch of the first diagonal with a widely patent circumflex stent. RCA was small and nondominant with 80% stenosis treated medically. Also has persistent atrial fibrillation on Eliquis, hypertension, HLD.  Saw Dr. Bronson Ing 06/12/16 and heart rate was 126 bpm. Toprol was increased to 75 mg daily. He questioned whether reduction in LV function could be tachycardia mediated. Weight was 151 pounds that day.  Tums in today accompanied by her daughter. He needs back surgery by Dr. Cyndy Freeze in [place] because some rods have come loose. She is in chronic severe back pain and is having trouble ambulating. She was supposed to have the surgery in January but it was canceled because she developed the flu and pneumonia. She has had some worsening dyspnea on exertion since then but it has gradually improved. She says she when she is walking she anticipates back pain and feels like she gets short of breath because of this. She was able to walk 200 yards without difficulty prior to the pneumonia. She has occasional twinges of chest pain that last only a few seconds and are sharp shooting. She denies any chest tightness or pressure, dyspnea at rest, dizziness or presyncope. She feels like her atrial fibrillation is much better controlled on this higher dose of Toprol.    Past Medical History:  Diagnosis Date  . Anxiety   . Arthritis   . Bursitis    left  shoulder  . Cataract   . CHF (congestive heart failure) (HCC)    EF 55-60%  . Complication of anesthesia   . COPD (chronic obstructive pulmonary disease) ([place])   . Coronary atherosclerosis of native coronary artery    a. DES to CX, moderately severe stenosis RCA, mild stenosis LAD 04/2013  . Depression   . Dysphagia, unspecified(787.20)   . GERD (gastroesophageal reflux disease)    Hx Schatzki's ring, multiple EGD/ED last 01/06/2004  . Headache   . Heart disease   . Heart murmur    'a littel'  . HTN (hypertension)    Hx of it  . Hyperlipemia   . Hyperlipidemia   . Internal hemorrhoids without mention of complication   . MI (myocardial infarction) 2006  . Microscopic colitis 2003  . Other and unspecified hyperlipidemia   . Panic disorder without agoraphobia   . Paresthesia    hands, feet  . Pneumonia 12/2011  . PONV (postoperative nausea and vomiting)    'a little nausea"  . PVD (peripheral vascular disease) ([place])   . S/P colonoscopy 09/27/2001   internal hemorrhoids, desc colon inflam polyp, SB BX-chronic duodenitis, colitis  . Shortness of breath   . Sleep disorder    obstructive  . Thyroid disease    recent abnl TSH per pt    Past Surgical History:  Procedure Laterality Date  . ABDOMINAL HYSTERECTOMY    . ANTERIOR AND POSTERIOR REPAIR  with resection of vagina  . APPENDECTOMY    . BACK SURGERY    . BIOPSY  07/05/2015   Procedure: BIOPSY;  Surgeon: Daneil Dolin, MD;  [facility];  Service: Endoscopy;;  gastric polyp biopsy, ascending colon biopsy  . BLADDER SUSPENSION  11/09/2011   Procedure: TRANSVAGINAL TAPE (TVT) PROCEDURE;  Surgeon: Marissa Nestle, MD;  Location: [facility] ORS;  Service: Urology;  Laterality: N/A;  . bladder tack  06/2010  . BREAST LUMPECTOMY  1998   left, benign  . CARDIAC CATHETERIZATION    . CARDIAC CATHETERIZATION    . CARDIAC CATHETERIZATION N/A 12/16/2015   Procedure: Left Heart Cath and Coronary Angiography;  Surgeon: Troy Sine, MD;  Location: [facility] CV LAB;  Service: Cardiovascular;  Laterality: N/A;  . [place]   left  . CHOLECYSTECTOMY  1998  . COLONOSCOPY  03/16/2011   multiple hyperplastic colon polyps, sigmoid diverticulosis, melanosis coli  . COLONOSCOPY WITH PROPOFOL N/A 07/05/2015   RMR:one 5 mm polyp in descending colon  . CORONARY ANGIOPLASTY WITH STENT PLACEMENT    . ESOPHAGEAL DILATION N/A 07/05/2015   Procedure: ESOPHAGEAL DILATION;  Surgeon: Daneil Dolin, MD;  [facility]: [facility];  Service: Endoscopy;  Laterality: N/A;  . ESOPHAGOGASTRODUODENOSCOPY (EGD) WITH PROPOFOL N/A 07/05/2015   OHY:[id number]  . JOINT REPLACEMENT Right 2007  . left hand surgery    . LEFT HEART CATHETERIZATION WITH CORONARY ANGIOGRAM N/A 05/14/2013   Procedure: LEFT HEART CATHETERIZATION WITH CORONARY ANGIOGRAM;  Surgeon: Blane Ohara, MD;  Location: [facility] CATH LAB;  Service: Cardiovascular;  Laterality: N/A;  . left rotator cuff surgery    . LUMBAR LAMINECTOMY/DECOMPRESSION MICRODISCECTOMY N/A 10/11/2012   Procedure: LUMBAR LAMINECTOMY/DECOMPRESSION MICRODISCECTOMY 2 LEVELS;  Surgeon: Floyce Stakes, MD;  Location: [facility] NEURO ORS;  Service: Neurosurgery;  Laterality: N/A;  L3-4 L4-5 Laminectomy  . LUMBAR WOUND DEBRIDEMENT N/A 09/27/2015   Procedure: Exploration of Lumbar Wound w/ Repair CSF Leak/Lumbar Drain Placement;  Surgeon: Leeroy Cha, MD;  Location: [facility] NEURO ORS;  Service: Neurosurgery;  Laterality: N/A;  . MALONEY DILATION  03/16/2011   Gastritis. No H.pylori on bx. 31F maloney dilation with disruption of  occult cevical esophageal web  . NASAL SINUS SURGERY    . right knee replacement  2007  . right leg benign tumor    . SHOULDER SURGERY Left   . TONSILLECTOMY    . unspecified area, hysterectomy  1972   partial    Current Medications: Outpatient Medications Prior to Visit  Medication Sig Dispense Refill  . acetaminophen (TYLENOL) 500 MG tablet Take 500 mg by mouth daily as  needed for headache.     . ALPRAZolam (XANAX) 0.25 MG tablet Take 0.25 mg by mouth at bedtime as needed for anxiety.    [name] Kitchen apixaban (ELIQUIS) 5 MG TABS tablet Take 1 tablet (5 mg total) by mouth 2 (two) times daily. 60 tablet 6  . bisacodyl (DULCOLAX) 5 MG EC tablet Take 1 tablet (5 mg total) by mouth daily as needed for moderate constipation. 30 tablet 0  . DULoxetine (CYMBALTA) 60 MG capsule Take 60 mg by mouth at bedtime.     . fluticasone (FLONASE) 50 MCG/ACT nasal spray Place 2 sprays into both nostrils daily.     . furosemide (LASIX) 40 MG tablet TAKE 40 MG TWICE A DAY FOR 3 DAYS, THEN TAKE 40 MG DAILY FROM THEN ON 90 tablet 3  . gabapentin (NEURONTIN) 300 MG capsule Take  1 capsule (300 mg total) by mouth 3 (three) times daily. 90 capsule 2  . hydrocortisone (ANUSOL-HC) 2.5 % rectal cream Place 1 application rectally 2 (two) times daily. 30 g 1  . hydrocortisone (ANUSOL-HC) 25 MG suppository Place 1 suppository (25 mg total) rectally every 12 (twelve) hours. 12 suppository 1  . ipratropium-albuterol (DUONEB) 0.5-2.5 (3) MG/3ML SOLN Take 3 mLs by nebulization every 6 (six) hours. 360 mL 0  . iron polysaccharides (NIFEREX) 150 MG capsule Take 150 mg by mouth daily.    . Magnesium 400 MG TABS Take 400 mg by mouth daily. 90 tablet 3  . Melatonin 3 MG CAPS Take 1 capsule by mouth at bedtime.    . metoprolol succinate (TOPROL-XL) 50 MG 24 hr tablet Take 1 1/2 tablets (75 mg ) daily 135 tablet 3  . nitroGLYCERIN (NITROSTAT) 0.4 MG SL tablet Place 0.4 mg under the tongue every 5 (five) minutes as needed for chest pain. Reported on 08/04/2015    . pantoprazole (PROTONIX) 40 MG tablet Take 40 mg by mouth every morning.      No facility-administered medications prior to visit.      Allergies:   Cephalosporins; Levaquin [levofloxacin in d5w]; Macrodantin [nitrofurantoin macrocrystal]; Phenothiazines; Polysorbate; Prednisone; Buspirone; Cardura [doxazosin mesylate]; Codeine; Acyclovir and related;  Prochlorperazine; Ranexa [ranolazine]; Atorvastatin; Ofloxacin; Other; Penicillins; and Pimozide   Social History   Social History  . Marital status: Divorced    Spouse name: N/A  . Number of children: 5  . Years of education: N/A   Occupational History  . retired    Social History Main Topics  . Smoking status: Former Smoker    Packs/day: 1.00    Years: 64.00    Types: Cigarettes    Start date: 12/24/1947    Quit date: 11/17/2001  . Smokeless tobacco: Never Used     Comment: Quit smoking in 2003  . Alcohol use No  . Drug use: No  . Sexual activity: No   Other Topics Concern  . None   Social History Narrative   Divorced.   Sister had colon perforation & died from complications in [place], [place]     Family History:  The patient's   family history includes Cancer in her other; Coronary artery disease in her other; Diabetes in her brother and son; Heart disease in her son; Hypertension in her other; Parkinson's disease in her father; Stroke in her mother and other; Stroke (age of onset: 43) in her daughter.   ROS:   Please see the history of present illness.    Review of Systems  Constitution: Negative.  HENT: Negative.   Eyes: Negative.   Cardiovascular: Positive for dyspnea on exertion and irregular heartbeat.  Respiratory: Positive for shortness of breath.   Hematologic/Lymphatic: Negative.   Musculoskeletal: Positive for back pain, muscle weakness, myalgias and stiffness. Negative for joint pain.  Gastrointestinal: Negative.   Genitourinary: Negative.   Neurological: Negative.    All other systems reviewed and are negative.   PHYSICAL EXAM:   VS:  BP 128/62   Pulse 91   Ht 5\' 1"  (1.549 m)   Wt 146 lb (66.2 kg)   SpO2 96%   BMI 27.59 kg/m   Physical Exam  GEN: Well nourished, well developed, in no acute distress  Neck: no JVD, carotid bruits, or masses Cardiac:Irregular irregular with 1 to 2/6 systolic murmur at the left sternal border, no rubs, or gallops    Respiratory:  Decreased breath sounds but clear GI:  soft, nontender, nondistended, + BS Ext: without cyanosis, clubbing, or edema, Good distal pulses bilaterally Skin: warm and dry, no rash Neuro:  Alert and Oriented x 3 Psych: euthymic mood, full affect  Wt Readings from Last 3 Encounters:  07/05/16 146 lb (66.2 kg)  06/15/16 148 lb 6.4 oz (67.3 kg)  06/12/16 151 lb (68.5 kg)      Studies/Labs Reviewed:   EKG:  EKG is   ordered today.  The ekg ordered today demonstrates atrial fibrillation at 102 bpm  Recent Labs: 04/15/2016: ALT 15; B Natriuretic Peptide 458.0; TSH 3.000 04/23/2016: BUN 23; Hemoglobin 11.3; Platelets 447; Potassium 3.6; Sodium 133 06/05/2016: Creatinine, Ser 1.30 06/12/2016: Magnesium 1.8   Lipid Panel    Component Value Date/Time   CHOL 177 12/16/2015 0435   TRIG 197 (H) 12/16/2015 0435   HDL 35 (L) 12/16/2015 0435   CHOLHDL 5.1 12/16/2015 0435   VLDL 39 12/16/2015 0435   LDLCALC 103 (H) 12/16/2015 0435    Additional studies/ records that were reviewed today include:    Echocardiogram 04/17/16:   - Left ventricle: The cavity size was normal. There was mild   concentric hypertrophy. Systolic function was moderately to   severely reduced. The estimated ejection fraction was in the   range of 30% to 35%. Wall motion was normal; there were no   regional wall motion abnormalities. - Aortic valve: Mildly calcified annulus. Trileaflet; normal   thickness, mildly calcified leaflets. There was mild   regurgitation. - Mitral valve: There was moderate regurgitation. - Left atrium: The atrium was moderately dilated. - Pulmonary arteries: Systolic pressure was mildly increased. PA   peak pressure: 38 mm Hg (S). - Pericardium, extracardiac: A small pericardial effusion was   identified.   This is a decline in LVEF which was previously 55% on 12/17/15.   She underwent percutaneous coronary intervention with drug-eluting stent to the left circumflex coronary  artery in January 2015. Coronary angiography 12/16/15 showed 20% stenosis in the proximal LAD and in the inferior branch of the first diagonal with a widely patent previously placed circumflex stent. RCA was small and nondominant with 80% ostial stenosis. Medical therapy was recommended.         ASSESSMENT:    1. Preoperative clearance   2. PAF (paroxysmal atrial fibrillation) ([place])   3. Coronary artery disease due to lipid rich plaque   4. Essential hypertension   5. NSVT (nonsustained ventricular tachycardia) (HCC)      PLAN:  In order of problems listed above:  Preoperative clearance for back surgery. Patient is in significant pain and says she will take the risk associated with surgery because of her severe pain. She does have coronary artery disease and had a low risk nuclear stress test in 09/2015. Cardiac catheterization 11/2015 with nonobstructive disease other than a small nondominant 80% RCA with patent stent in the circumflex. Continue medical therapy. She also has new LV dysfunction LVEF 30-35% without evidence of heart failure at this time. She also has atrial fibrillation and is managed with Toprol and Eliquis. I don't believe she needs any further cardiac workup prior to surgery. She has chronic dyspnea on exertion due to COPD and LV dysfunction. No angina. She is at increased risk for surgery but with her severe pain it's reasonable to proceed. She will have to hold Eliquis 48 hrs prior to surgery. Will have Dr. Bronson Ing review.  PAF rate is mostly controlled on Toprol-XL 75 mg daily. Reluctant to increase this further with  her COPD. Continue Eliquis for CHADSVASC=5  CAD status post DES to the circumflex in 2015 last cath showed patent stent see above. No angina  Essential hypertension controlled  NSVT on Toprol    Medication Adjustments/Labs and Tests Ordered: Current medicines are reviewed at length with the patient today.  Concerns regarding medicines are outlined  above.  Medication changes, Labs and Tests ordered today are listed in the Patient Instructions below. Patient Instructions  Your physician recommends that you schedule a follow-up appointment in: 3 Months with Dr. Bronson Ing.  Your physician recommends that you continue on your current medications as directed. Please refer to the Current Medication list given to you today.  You have been cleared for surgery from a cardiac standpoint. Hold Eliquis 48 hours prior to surgery.   If you need a refill on your cardiac medications before your next appointment, please call your pharmacy.  Thank you for choosing [facility]!         Signed, Ermalinda Barrios, PA-C  07/05/2016 1:28 PM    [facility] [place], [place], [place]  [address] Phone: [phone]; Fax: [phone]

## 2016-07-06 ENCOUNTER — Other Ambulatory Visit: Payer: Self-pay | Admitting: Neurological Surgery

## 2016-07-12 DIAGNOSIS — Z6827 Body mass index (BMI) 27.0-27.9, adult: Secondary | ICD-10-CM | POA: Diagnosis not present

## 2016-07-12 DIAGNOSIS — R197 Diarrhea, unspecified: Secondary | ICD-10-CM | POA: Diagnosis not present

## 2016-07-12 DIAGNOSIS — L299 Pruritus, unspecified: Secondary | ICD-10-CM | POA: Diagnosis not present

## 2016-07-17 DIAGNOSIS — R197 Diarrhea, unspecified: Secondary | ICD-10-CM | POA: Diagnosis not present

## 2016-07-24 ENCOUNTER — Encounter (HOSPITAL_COMMUNITY): Payer: Self-pay

## 2016-07-24 ENCOUNTER — Encounter (HOSPITAL_COMMUNITY)
Admission: RE | Admit: 2016-07-24 | Discharge: 2016-07-24 | Disposition: A | Discharge status: Home / Self Care | Payer: Medicare Other | Source: Ambulatory Visit | Attending: Neurological Surgery | Admitting: Neurological Surgery

## 2016-07-24 DIAGNOSIS — I4891 Unspecified atrial fibrillation: Secondary | ICD-10-CM | POA: Insufficient documentation

## 2016-07-24 DIAGNOSIS — Z01812 Encounter for preprocedural laboratory examination: Secondary | ICD-10-CM | POA: Diagnosis not present

## 2016-07-24 HISTORY — DX: Unspecified atrial fibrillation: I48.91

## 2016-07-24 HISTORY — DX: Personal history of diseases of the blood and blood-forming organs and certain disorders involving the immune mechanism: Z86.2

## 2016-07-24 LAB — CBC
HEMATOCRIT: 31.5 % — AB (ref 36.0–46.0)
HEMOGLOBIN: 10 g/dL — AB (ref 12.0–15.0)
MCH: 24.4 pg — ABNORMAL LOW (ref 26.0–34.0)
MCHC: 31.7 g/dL (ref 30.0–36.0)
MCV: 76.8 fL — ABNORMAL LOW (ref 78.0–100.0)
Platelets: 325 10*3/uL (ref 150–400)
RBC: 4.1 MIL/uL (ref 3.87–5.11)
RDW: 17.7 % — AB (ref 11.5–15.5)
WBC: 7.9 10*3/uL (ref 4.0–10.5)

## 2016-07-24 LAB — TYPE AND SCREEN
ABO/RH(D): O POS
Antibody Screen: NEGATIVE

## 2016-07-24 LAB — SURGICAL PCR SCREEN
MRSA, PCR: NEGATIVE
Staphylococcus aureus: NEGATIVE

## 2016-07-24 LAB — BASIC METABOLIC PANEL
ANION GAP: 11 (ref 5–15)
BUN: 16 mg/dL (ref 6–20)
CALCIUM: 9 mg/dL (ref 8.9–10.3)
CHLORIDE: 97 mmol/L — AB (ref 101–111)
CO2: 27 mmol/L (ref 22–32)
Creatinine, Ser: 1.17 mg/dL — ABNORMAL HIGH (ref 0.44–1.00)
GFR calc non Af Amer: 43 mL/min — ABNORMAL LOW (ref >60)
GFR, EST AFRICAN AMERICAN: 49 mL/min — AB (ref >60)
Glucose, Bld: 113 mg/dL — ABNORMAL HIGH (ref 65–99)
Potassium: 2.6 mmol/L — CL (ref 3.5–5.1)
SODIUM: 135 mmol/L (ref 135–145)

## 2016-07-24 MED ORDER — CHLORHEXIDINE GLUCONATE CLOTH 2 % EX PADS: 6.0000 | MEDICATED_PAD | Freq: Once | CUTANEOUS | Status: DC

## 2016-07-24 NOTE — Progress Notes (Signed)
PCP: Carney Corners at [facility] Cardiologist: Dr. Bronson Ing  EKG: 07/05/16 CXR: 05/26/16 ECHO: 04/17/16 Stress test: 11/25/13 Cath: 12/16/15   Pt on Eliquis, given instructions by MD to hold after 07/29/16 Pt denies chest pain since January when she was hospitalized for pneumonia. Pt states she has been cleared by her cardiologist for surgery.   D/t patient heart history, recent chest pain, pt chart forwarded to anesthesia for review.

## 2016-07-24 NOTE — Pre-Procedure Instructions (Signed)
[name]  07/24/2016      [facility] - [address], [place], [place] - [address] [place] [address] Phone: [phone] Fax: [phone]    Your procedure is scheduled on Tuesday April 17.  Report to [facility] Admitting at 9:00 A.M.  Call this number if you have problems the morning of surgery:  [phone]   Remember:  Do not eat food or drink liquids after midnight.  Take these medicines the morning of surgery with A SIP OF WATER: acetaminophen (tylenol), gabapentin (neurontin), metoprolol (Toprol- XL), pantoprazole (protonix), alprazolam (Xanax) if needed, DUONEB nebulizer if needed  Take all other medications as prescribed except 7 days prior to surgery STOP taking any Aspirin, Aleve, Naproxen, Ibuprofen, Motrin, Advil, Goody's, BC's, all herbal medications, fish oil, and all vitamins  Follow MD's instructions on stopping Eliquis- stop taking on 07/29/16   Do not wear jewelry, make-up or nail polish.  Do not wear lotions, powders, or perfumes, or deoderant.  Do not shave 48 hours prior to surgery.  Men may shave face and neck.  Do not bring valuables to the hospital.  [facility] is not responsible for any belongings or valuables.  Contacts, dentures or bridgework may not be worn into surgery.  Leave your suitcase in the car.  After surgery it may be brought to your room.  For patients admitted to the hospital, discharge time will be determined by your treatment team.  Patients discharged the day of surgery will not be allowed to drive home.    Special instructions:    [facility]- Preparing For Surgery  Before surgery, you can play an important role. Because skin is not sterile, your skin needs to be as free of germs as possible. You can reduce the number of germs on your skin by washing with CHG (chlorahexidine gluconate) Soap before surgery.  CHG is an antiseptic cleaner which kills germs and bonds with the skin to continue  killing germs even after washing.  Please do not use if you have an allergy to CHG or antibacterial soaps. If your skin becomes reddened/irritated stop using the CHG.  Do not shave (including legs and underarms) for at least 48 hours prior to first CHG shower. It is OK to shave your face.  Please follow these instructions carefully.   1. Shower the NIGHT BEFORE SURGERY and the MORNING OF SURGERY with CHG.   2. If you chose to wash your hair, wash your hair first as usual with your normal shampoo.  3. After you shampoo, rinse your hair and body thoroughly to remove the shampoo.  4. Use CHG as you would any other liquid soap. You can apply CHG directly to the skin and wash gently with a scrungie or a clean washcloth.   5. Apply the CHG Soap to your body ONLY FROM THE NECK DOWN.  Do not use on open wounds or open sores. Avoid contact with your eyes, ears, mouth and genitals (private parts). Wash genitals (private parts) with your normal soap.  6. Wash thoroughly, paying special attention to the area where your surgery will be performed.  7. Thoroughly rinse your body with warm water from the neck down.  8. DO NOT shower/wash with your normal soap after using and rinsing off the CHG Soap.  9. Pat yourself dry with a CLEAN TOWEL.   10. Wear CLEAN PAJAMAS   11. Place CLEAN SHEETS on your bed the night of  your first shower and DO NOT SLEEP WITH PETS.    Day of Surgery: Do not apply any deodorants/lotions. Please wear clean clothes to the hospital/surgery center.      Please read over the following fact sheets that you were given. MRSA Information

## 2016-07-24 NOTE — Progress Notes (Signed)
CRITICAL VALUE ALERT  Critical value received:  K. 2.6  Date of notification:  07/24/16  Time of notification:  2:01 PM   Critical value read back:Yes.    Nurse who received alert:  Fabian Sharp, RN  MD notified (1st page):  Levada Dy  Time of first page:  2:01 PM  MD notified (2nd page):  Time of second page:  Responding MD:  Josph Macho  Time MD responded:  2:01 PM

## 2016-07-26 NOTE — Progress Notes (Signed)
Anesthesia Chart Review:  Pt is an 83 year old female scheduled for L2-5 transpsoas lateral interbody fusion with L2-3 lateral plate fixation P3/79/0240 with Cyndy Freeze, M.D.  - PCP is Renaldo Fiddler, [place] - Cardiologist is Kate Sable, MD; pt was cleared for surgery at increased risk at last office visit 07/05/16 with Ermalinda Barrios, PA.   PMH includes:  CAD (DES to CX 2015), atrial fibrillation, CHF, PVD, HTN, heart murmur, hyperlipidemia, OSA, thyroid disease, post-op N/V, GERD. Former smoker. BMI 28.   - S/p lumbar fusion 09/24/15 and exploration of wound/repair csf leak/drain placement 09/27/15. S/p lumbar laminectomy 10/11/12.   Medications include: Eliquis, Lasix, DuoNeb, metoprolol, Protonix. Last dose Eliquis will be 07/29/16.  Preoperative labs reviewed.   - K 2.6.   PCP's office and Janett Billow in Dr. Hewitt Shorts office notified.  Per Janett Billow, PCP's office Rx potassium for pt. Will recheck K DOS.  - PT will be obtained DOS.   CXR 05/26/16: Resolved lower lobe opacity since January. No new cardiopulmonary abnormality. Calcified aortic atherosclerosis.  EKG 07/05/16:  Atrial fibrillation (102 bpm)  Echo 04/17/16:  - Left ventricle: The cavity size was normal. There was mild concentric hypertrophy. Systolic function was moderately to severely reduced. The estimated ejection fraction was in the range of 30% to 35%. Wall motion was normal; there were no regional wall motion abnormalities. - Aortic valve: Mildly calcified annulus. Trileaflet; normal thickness, mildly calcified leaflets. There was mild regurgitation. - Mitral valve: There was moderate regurgitation. - Left atrium: The atrium was moderately dilated. - Pulmonary arteries: Systolic pressure was mildly increased. PA peak pressure: 38 mm Hg (S). - Pericardium, extracardiac: A small pericardial effusion was identified.  Cardiac cath 12/16/15:   Ost RCA lesion, 80 %stenosed.  Ost Cx to Prox Cx lesion, 0 %stenosed.  Ost LAD  to Prox LAD lesion, 20 %stenosed.  1st Diag lesion, 20 %stenosed.  4th Mrg lesion, 20 %stenosed.  The left ventricular systolic function is normal.  LV end diastolic pressure is mildly elevated.  The left ventricular ejection fraction is 60-65% by visual estimate. RECOMMENDATION: Medical therapy.  Of note, the patient does have significant change in respiratory intrathoracic pressure from inspiratory and expiratory cycles.  If labs acceptable DOS and no acute changes, I anticipate pt can proceed as scheduled.   Willeen Cass, FNP-BC [facility] Short Stay Surgical [facility]/Anesthesiology Phone: [phone] 07/26/2016 2:40 PM

## 2016-07-28 DIAGNOSIS — E876 Hypokalemia: Secondary | ICD-10-CM | POA: Diagnosis not present

## 2016-08-01 ENCOUNTER — Inpatient Hospital Stay (HOSPITAL_COMMUNITY): Payer: Medicare Other | Admitting: Emergency Medicine

## 2016-08-01 ENCOUNTER — Encounter (HOSPITAL_COMMUNITY): Payer: Self-pay

## 2016-08-01 ENCOUNTER — Encounter (HOSPITAL_COMMUNITY)
Admission: RE | Disposition: A | Discharge status: Skilled Nursing Facility | Payer: Self-pay | Source: Ambulatory Visit | Attending: Neurological Surgery

## 2016-08-01 ENCOUNTER — Inpatient Hospital Stay (HOSPITAL_COMMUNITY)
Admission: RE | Admit: 2016-08-01 | Discharge: 2016-08-06 | DRG: 460 | Disposition: A | Discharge status: Skilled Nursing Facility | Payer: Medicare Other | Source: Ambulatory Visit | Attending: Neurological Surgery | Admitting: Neurological Surgery

## 2016-08-01 ENCOUNTER — Inpatient Hospital Stay (HOSPITAL_COMMUNITY): Payer: Medicare Other

## 2016-08-01 DIAGNOSIS — R262 Difficulty in walking, not elsewhere classified: Secondary | ICD-10-CM | POA: Diagnosis not present

## 2016-08-01 DIAGNOSIS — I959 Hypotension, unspecified: Secondary | ICD-10-CM | POA: Diagnosis not present

## 2016-08-01 DIAGNOSIS — I110 Hypertensive heart disease with heart failure: Secondary | ICD-10-CM | POA: Diagnosis present

## 2016-08-01 DIAGNOSIS — Z955 Presence of coronary angioplasty implant and graft: Secondary | ICD-10-CM

## 2016-08-01 DIAGNOSIS — I252 Old myocardial infarction: Secondary | ICD-10-CM | POA: Diagnosis not present

## 2016-08-01 DIAGNOSIS — Y831 Surgical operation with implant of artificial internal device as the cause of abnormal reaction of the patient, or of later complication, without mention of misadventure at the time of the procedure: Secondary | ICD-10-CM | POA: Diagnosis present

## 2016-08-01 DIAGNOSIS — I509 Heart failure, unspecified: Secondary | ICD-10-CM | POA: Diagnosis present

## 2016-08-01 DIAGNOSIS — Z7901 Long term (current) use of anticoagulants: Secondary | ICD-10-CM | POA: Diagnosis not present

## 2016-08-01 DIAGNOSIS — F329 Major depressive disorder, single episode, unspecified: Secondary | ICD-10-CM | POA: Diagnosis present

## 2016-08-01 DIAGNOSIS — I209 Angina pectoris, unspecified: Secondary | ICD-10-CM | POA: Diagnosis not present

## 2016-08-01 DIAGNOSIS — R293 Abnormal posture: Secondary | ICD-10-CM | POA: Diagnosis not present

## 2016-08-01 DIAGNOSIS — M4317 Spondylolisthesis, lumbosacral region: Secondary | ICD-10-CM | POA: Diagnosis present

## 2016-08-01 DIAGNOSIS — F410 Panic disorder [episodic paroxysmal anxiety] without agoraphobia: Secondary | ICD-10-CM | POA: Diagnosis not present

## 2016-08-01 DIAGNOSIS — G030 Nonpyogenic meningitis: Secondary | ICD-10-CM | POA: Diagnosis not present

## 2016-08-01 DIAGNOSIS — I2510 Atherosclerotic heart disease of native coronary artery without angina pectoris: Secondary | ICD-10-CM | POA: Diagnosis present

## 2016-08-01 DIAGNOSIS — T84226A Displacement of internal fixation device of vertebrae, initial encounter: Secondary | ICD-10-CM | POA: Diagnosis present

## 2016-08-01 DIAGNOSIS — Z87891 Personal history of nicotine dependence: Secondary | ICD-10-CM | POA: Diagnosis not present

## 2016-08-01 DIAGNOSIS — E876 Hypokalemia: Secondary | ICD-10-CM | POA: Diagnosis not present

## 2016-08-01 DIAGNOSIS — I739 Peripheral vascular disease, unspecified: Secondary | ICD-10-CM | POA: Diagnosis not present

## 2016-08-01 DIAGNOSIS — M4727 Other spondylosis with radiculopathy, lumbosacral region: Secondary | ICD-10-CM | POA: Diagnosis present

## 2016-08-01 DIAGNOSIS — J449 Chronic obstructive pulmonary disease, unspecified: Secondary | ICD-10-CM | POA: Diagnosis present

## 2016-08-01 DIAGNOSIS — I4891 Unspecified atrial fibrillation: Secondary | ICD-10-CM | POA: Diagnosis present

## 2016-08-01 DIAGNOSIS — R000 Tachycardia, unspecified: Secondary | ICD-10-CM | POA: Diagnosis not present

## 2016-08-01 DIAGNOSIS — G4733 Obstructive sleep apnea (adult) (pediatric): Secondary | ICD-10-CM | POA: Diagnosis present

## 2016-08-01 DIAGNOSIS — I25119 Atherosclerotic heart disease of native coronary artery with unspecified angina pectoris: Secondary | ICD-10-CM | POA: Diagnosis not present

## 2016-08-01 DIAGNOSIS — R488 Other symbolic dysfunctions: Secondary | ICD-10-CM | POA: Diagnosis not present

## 2016-08-01 DIAGNOSIS — F411 Generalized anxiety disorder: Secondary | ICD-10-CM | POA: Diagnosis not present

## 2016-08-01 DIAGNOSIS — E785 Hyperlipidemia, unspecified: Secondary | ICD-10-CM | POA: Diagnosis present

## 2016-08-01 DIAGNOSIS — Z96651 Presence of right artificial knee joint: Secondary | ICD-10-CM | POA: Diagnosis present

## 2016-08-01 DIAGNOSIS — E782 Mixed hyperlipidemia: Secondary | ICD-10-CM | POA: Diagnosis not present

## 2016-08-01 DIAGNOSIS — Z7951 Long term (current) use of inhaled steroids: Secondary | ICD-10-CM | POA: Diagnosis not present

## 2016-08-01 DIAGNOSIS — Z79899 Other long term (current) drug therapy: Secondary | ICD-10-CM | POA: Diagnosis not present

## 2016-08-01 DIAGNOSIS — M4316 Spondylolisthesis, lumbar region: Secondary | ICD-10-CM | POA: Diagnosis present

## 2016-08-01 DIAGNOSIS — K219 Gastro-esophageal reflux disease without esophagitis: Secondary | ICD-10-CM | POA: Diagnosis present

## 2016-08-01 DIAGNOSIS — M1990 Unspecified osteoarthritis, unspecified site: Secondary | ICD-10-CM | POA: Diagnosis not present

## 2016-08-01 DIAGNOSIS — R279 Unspecified lack of coordination: Secondary | ICD-10-CM | POA: Diagnosis not present

## 2016-08-01 DIAGNOSIS — M6281 Muscle weakness (generalized): Secondary | ICD-10-CM | POA: Diagnosis not present

## 2016-08-01 DIAGNOSIS — M549 Dorsalgia, unspecified: Secondary | ICD-10-CM | POA: Diagnosis not present

## 2016-08-01 DIAGNOSIS — T84296A Other mechanical complication of internal fixation device of vertebrae, initial encounter: Secondary | ICD-10-CM | POA: Diagnosis not present

## 2016-08-01 DIAGNOSIS — M5416 Radiculopathy, lumbar region: Secondary | ICD-10-CM | POA: Diagnosis not present

## 2016-08-01 DIAGNOSIS — Z48811 Encounter for surgical aftercare following surgery on the nervous system: Secondary | ICD-10-CM | POA: Diagnosis not present

## 2016-08-01 DIAGNOSIS — I480 Paroxysmal atrial fibrillation: Secondary | ICD-10-CM | POA: Diagnosis not present

## 2016-08-01 DIAGNOSIS — D500 Iron deficiency anemia secondary to blood loss (chronic): Secondary | ICD-10-CM | POA: Diagnosis not present

## 2016-08-01 DIAGNOSIS — Z419 Encounter for procedure for purposes other than remedying health state, unspecified: Secondary | ICD-10-CM

## 2016-08-01 DIAGNOSIS — M5136 Other intervertebral disc degeneration, lumbar region: Secondary | ICD-10-CM | POA: Diagnosis not present

## 2016-08-01 DIAGNOSIS — M545 Low back pain: Secondary | ICD-10-CM | POA: Diagnosis not present

## 2016-08-01 DIAGNOSIS — I5032 Chronic diastolic (congestive) heart failure: Secondary | ICD-10-CM | POA: Diagnosis not present

## 2016-08-01 DIAGNOSIS — M4711 Other spondylosis with myelopathy, occipito-atlanto-axial region: Secondary | ICD-10-CM | POA: Diagnosis not present

## 2016-08-01 DIAGNOSIS — Z981 Arthrodesis status: Secondary | ICD-10-CM | POA: Diagnosis not present

## 2016-08-01 HISTORY — PX: ANTERIOR LAT LUMBAR FUSION: SHX1168

## 2016-08-01 LAB — POCT I-STAT 4, (NA,K, GLUC, HGB,HCT)
Glucose, Bld: 122 mg/dL — ABNORMAL HIGH (ref 65–99)
HCT: 34 % — ABNORMAL LOW (ref 36.0–46.0)
Hemoglobin: 11.6 g/dL — ABNORMAL LOW (ref 12.0–15.0)
Potassium: 3.6 mmol/L (ref 3.5–5.1)
Sodium: 140 mmol/L (ref 135–145)

## 2016-08-01 LAB — PROTIME-INR
INR: 1.13
PROTHROMBIN TIME: 14.6 s (ref 11.4–15.2)

## 2016-08-01 SURGERY — ANTERIOR LATERAL LUMBAR FUSION 3 LEVELS
Anesthesia: General | Site: Spine Lumbar

## 2016-08-01 MED ORDER — BUPIVACAINE HCL 0.5 % IJ SOLN
INTRAMUSCULAR | Status: DC | PRN
  Administered 2016-08-01: 4 mL
  Administered 2016-08-01: 15 mL
  Administered 2016-08-01: 2 mL

## 2016-08-01 MED ORDER — METOPROLOL SUCCINATE ER 25 MG PO TB24
75.0000 mg | ORAL_TABLET | Freq: Every day | ORAL | Status: DC
  Administered 2016-08-03 – 2016-08-06 (×3): 75 mg via ORAL
  Filled 2016-08-01 (×4): qty 3

## 2016-08-01 MED ORDER — BISACODYL 5 MG PO TBEC
5.0000 mg | DELAYED_RELEASE_TABLET | Freq: Every day | ORAL | Status: DC | PRN
  Administered 2016-08-05: 5 mg via ORAL
  Filled 2016-08-01: qty 1

## 2016-08-01 MED ORDER — LACTATED RINGERS IV SOLN
INTRAVENOUS | Status: DC | PRN
  Administered 2016-08-01 (×2): via INTRAVENOUS

## 2016-08-01 MED ORDER — ONDANSETRON HCL 4 MG/2ML IJ SOLN
4.0000 mg | Freq: Four times a day (QID) | INTRAMUSCULAR | Status: DC | PRN
  Administered 2016-08-01: 4 mg via INTRAVENOUS
  Filled 2016-08-01: qty 2

## 2016-08-01 MED ORDER — LACTATED RINGERS IV SOLN: INTRAVENOUS | Status: DC | PRN

## 2016-08-01 MED ORDER — FENTANYL CITRATE (PF) 100 MCG/2ML IJ SOLN
INTRAMUSCULAR | Status: DC | PRN
  Administered 2016-08-01: 25 ug via INTRAVENOUS
  Administered 2016-08-01: 50 ug via INTRAVENOUS
  Administered 2016-08-01 (×2): 25 ug via INTRAVENOUS
  Administered 2016-08-01: 100 ug via INTRAVENOUS
  Administered 2016-08-01: 25 ug via INTRAVENOUS

## 2016-08-01 MED ORDER — ONDANSETRON HCL 4 MG PO TABS
4.0000 mg | ORAL_TABLET | Freq: Four times a day (QID) | ORAL | Status: DC | PRN
Start: 2016-08-01 — End: 2016-08-06

## 2016-08-01 MED ORDER — OXYCODONE HCL 5 MG PO TABS
5.0000 mg | ORAL_TABLET | ORAL | Status: DC | PRN
  Administered 2016-08-01: 5 mg via ORAL
  Administered 2016-08-04: 10 mg via ORAL
  Filled 2016-08-01: qty 2
  Filled 2016-08-01: qty 1

## 2016-08-01 MED ORDER — SODIUM CHLORIDE 0.9 % IV SOLN
INTRAVENOUS | Status: DC
  Administered 2016-08-01 – 2016-08-02 (×2): via INTRAVENOUS

## 2016-08-01 MED ORDER — VANCOMYCIN HCL IN DEXTROSE 1-5 GM/200ML-% IV SOLN
1000.0000 mg | INTRAVENOUS | Status: AC
  Administered 2016-08-01: 1000 mg via INTRAVENOUS
  Filled 2016-08-01: qty 200

## 2016-08-01 MED ORDER — SUCCINYLCHOLINE CHLORIDE 200 MG/10ML IV SOSY
PREFILLED_SYRINGE | INTRAVENOUS | Status: AC
  Filled 2016-08-01: qty 10

## 2016-08-01 MED ORDER — PANTOPRAZOLE SODIUM 40 MG IV SOLR
40.0000 mg | Freq: Every day | INTRAVENOUS | Status: DC
  Administered 2016-08-01 – 2016-08-05 (×5): 40 mg via INTRAVENOUS
  Filled 2016-08-01 (×5): qty 40

## 2016-08-01 MED ORDER — PHENOL 1.4 % MT LIQD: 1.0000 | OROMUCOSAL | Status: DC | PRN

## 2016-08-01 MED ORDER — SODIUM CHLORIDE 0.9% FLUSH: 3.0000 mL | INTRAVENOUS | Status: DC | PRN

## 2016-08-01 MED ORDER — HYDROMORPHONE HCL 1 MG/ML IJ SOLN: 0.2500 mg | INTRAMUSCULAR | Status: DC | PRN

## 2016-08-01 MED ORDER — ONDANSETRON HCL 4 MG/2ML IJ SOLN
INTRAMUSCULAR | Status: AC
  Filled 2016-08-01: qty 2

## 2016-08-01 MED ORDER — THROMBIN 5000 UNITS EX SOLR
CUTANEOUS | Status: AC
  Filled 2016-08-01: qty 5000

## 2016-08-01 MED ORDER — SUCCINYLCHOLINE CHLORIDE 200 MG/10ML IV SOSY
PREFILLED_SYRINGE | INTRAVENOUS | Status: DC | PRN
  Administered 2016-08-01: 100 mg via INTRAVENOUS

## 2016-08-01 MED ORDER — HYDROCORTISONE ACETATE 25 MG RE SUPP
25.0000 mg | Freq: Two times a day (BID) | RECTAL | Status: DC | PRN
  Filled 2016-08-01: qty 1

## 2016-08-01 MED ORDER — OXYCODONE HCL ER 10 MG PO T12A
10.0000 mg | EXTENDED_RELEASE_TABLET | Freq: Two times a day (BID) | ORAL | Status: DC
  Administered 2016-08-01 – 2016-08-05 (×7): 10 mg via ORAL
  Filled 2016-08-01 (×8): qty 1

## 2016-08-01 MED ORDER — PANTOPRAZOLE SODIUM 40 MG PO TBEC
40.0000 mg | DELAYED_RELEASE_TABLET | Freq: Every day | ORAL | Status: DC
  Administered 2016-08-02 – 2016-08-06 (×5): 40 mg via ORAL
  Filled 2016-08-01 (×5): qty 1

## 2016-08-01 MED ORDER — GABAPENTIN 300 MG PO CAPS
300.0000 mg | ORAL_CAPSULE | Freq: Three times a day (TID) | ORAL | Status: DC
  Administered 2016-08-01 – 2016-08-06 (×13): 300 mg via ORAL
  Filled 2016-08-01 (×13): qty 1

## 2016-08-01 MED ORDER — 0.9 % SODIUM CHLORIDE (POUR BTL) OPTIME
TOPICAL | Status: DC | PRN
  Administered 2016-08-01: 1000 mL

## 2016-08-01 MED ORDER — SENNA 8.6 MG PO TABS
1.0000 | ORAL_TABLET | Freq: Two times a day (BID) | ORAL | Status: DC
  Administered 2016-08-01 – 2016-08-06 (×10): 8.6 mg via ORAL
  Filled 2016-08-01 (×10): qty 1

## 2016-08-01 MED ORDER — DOCUSATE SODIUM 100 MG PO CAPS
100.0000 mg | ORAL_CAPSULE | Freq: Two times a day (BID) | ORAL | Status: DC
  Administered 2016-08-01 – 2016-08-06 (×10): 100 mg via ORAL
  Filled 2016-08-01 (×10): qty 1

## 2016-08-01 MED ORDER — FLEET ENEMA 7-19 GM/118ML RE ENEM
1.0000 | ENEMA | Freq: Once | RECTAL | Status: DC | PRN
  Filled 2016-08-01: qty 1

## 2016-08-01 MED ORDER — ADULT MULTIVITAMIN W/MINERALS CH
1.0000 | ORAL_TABLET | Freq: Every day | ORAL | Status: DC
  Administered 2016-08-02 – 2016-08-05 (×4): 1 via ORAL
  Filled 2016-08-01 (×4): qty 1

## 2016-08-01 MED ORDER — ALPRAZOLAM 0.25 MG PO TABS: 0.2500 mg | ORAL_TABLET | Freq: Two times a day (BID) | ORAL | Status: DC | PRN

## 2016-08-01 MED ORDER — PHENYLEPHRINE HCL 10 MG/ML IJ SOLN
INTRAVENOUS | Status: DC | PRN
  Administered 2016-08-01: 60 ug/min via INTRAVENOUS
  Administered 2016-08-01: 40 ug/min via INTRAVENOUS

## 2016-08-01 MED ORDER — FUROSEMIDE 40 MG PO TABS
40.0000 mg | ORAL_TABLET | Freq: Every day | ORAL | Status: DC
  Administered 2016-08-01 – 2016-08-06 (×6): 40 mg via ORAL
  Filled 2016-08-01 (×6): qty 1

## 2016-08-01 MED ORDER — THROMBIN 5000 UNITS EX SOLR
OROMUCOSAL | Status: DC | PRN
  Administered 2016-08-01: 13:00:00 via TOPICAL

## 2016-08-01 MED ORDER — ACETAMINOPHEN 500 MG PO TABS
1000.0000 mg | ORAL_TABLET | Freq: Four times a day (QID) | ORAL | Status: DC
  Administered 2016-08-02 – 2016-08-06 (×14): 1000 mg via ORAL
  Filled 2016-08-01 (×16): qty 2

## 2016-08-01 MED ORDER — METHOCARBAMOL 750 MG PO TABS
750.0000 mg | ORAL_TABLET | Freq: Four times a day (QID) | ORAL | Status: DC
  Administered 2016-08-01 – 2016-08-05 (×11): 750 mg via ORAL
  Filled 2016-08-01 (×11): qty 1

## 2016-08-01 MED ORDER — LIDOCAINE-EPINEPHRINE 2 %-1:100000 IJ SOLN
INTRAMUSCULAR | Status: AC
  Filled 2016-08-01: qty 1

## 2016-08-01 MED ORDER — LIDOCAINE-EPINEPHRINE 2 %-1:100000 IJ SOLN
INTRAMUSCULAR | Status: DC | PRN
  Administered 2016-08-01: 15 mL

## 2016-08-01 MED ORDER — BUPIVACAINE HCL (PF) 0.5 % IJ SOLN
INTRAMUSCULAR | Status: AC
  Filled 2016-08-01: qty 30

## 2016-08-01 MED ORDER — MENTHOL 3 MG MT LOZG: 1.0000 | LOZENGE | OROMUCOSAL | Status: DC | PRN

## 2016-08-01 MED ORDER — EPHEDRINE 5 MG/ML INJ
INTRAVENOUS | Status: AC
  Filled 2016-08-01: qty 10

## 2016-08-01 MED ORDER — IPRATROPIUM-ALBUTEROL 0.5-2.5 (3) MG/3ML IN SOLN: 3.0000 mL | Freq: Four times a day (QID) | RESPIRATORY_TRACT | Status: DC | PRN

## 2016-08-01 MED ORDER — PROPOFOL 500 MG/50ML IV EMUL
INTRAVENOUS | Status: DC | PRN
  Administered 2016-08-01: 50 ug/kg/min via INTRAVENOUS

## 2016-08-01 MED ORDER — ETOMIDATE 2 MG/ML IV SOLN
INTRAVENOUS | Status: DC | PRN
  Administered 2016-08-01: 10 mg via INTRAVENOUS

## 2016-08-01 MED ORDER — SODIUM CHLORIDE 0.9% FLUSH
3.0000 mL | Freq: Two times a day (BID) | INTRAVENOUS | Status: DC
  Administered 2016-08-01 – 2016-08-05 (×7): 3 mL via INTRAVENOUS

## 2016-08-01 MED ORDER — FLUTICASONE PROPIONATE 50 MCG/ACT NA SUSP
2.0000 | Freq: Every day | NASAL | Status: DC
  Filled 2016-08-01: qty 16

## 2016-08-01 MED ORDER — SODIUM CHLORIDE 0.9 % IR SOLN
Status: DC | PRN
  Administered 2016-08-01: 13:00:00

## 2016-08-01 MED ORDER — PROPOFOL 10 MG/ML IV BOLUS
INTRAVENOUS | Status: AC
  Filled 2016-08-01: qty 20

## 2016-08-01 MED ORDER — CEFAZOLIN IN D5W 1 GM/50ML IV SOLN
1.0000 g | Freq: Three times a day (TID) | INTRAVENOUS | Status: AC
  Administered 2016-08-01 – 2016-08-02 (×2): 1 g via INTRAVENOUS
  Filled 2016-08-01 (×2): qty 50

## 2016-08-01 MED ORDER — DULOXETINE HCL 60 MG PO CPEP
60.0000 mg | ORAL_CAPSULE | Freq: Every day | ORAL | Status: DC
  Administered 2016-08-01 – 2016-08-05 (×5): 60 mg via ORAL
  Filled 2016-08-01 (×5): qty 1

## 2016-08-01 MED ORDER — MAGNESIUM 400 MG PO TABS: 400.0000 mg | ORAL_TABLET | Freq: Every day | ORAL | Status: DC

## 2016-08-01 MED ORDER — ONDANSETRON HCL 4 MG/2ML IJ SOLN
INTRAMUSCULAR | Status: DC | PRN
  Administered 2016-08-01: 4 mg via INTRAVENOUS

## 2016-08-01 MED ORDER — NITROGLYCERIN 0.4 MG SL SUBL: 0.4000 mg | SUBLINGUAL_TABLET | SUBLINGUAL | Status: DC | PRN

## 2016-08-01 MED ORDER — SODIUM CHLORIDE 0.9 % IV SOLN: 250.0000 mL | INTRAVENOUS | Status: DC

## 2016-08-01 MED ORDER — ZOLPIDEM TARTRATE 5 MG PO TABS: 5.0000 mg | ORAL_TABLET | Freq: Every evening | ORAL | Status: DC | PRN

## 2016-08-01 MED ORDER — LACTATED RINGERS IV SOLN
INTRAVENOUS | Status: DC
  Administered 2016-08-01: 09:00:00 via INTRAVENOUS

## 2016-08-01 MED ORDER — MAGNESIUM OXIDE 400 (241.3 MG) MG PO TABS
400.0000 mg | ORAL_TABLET | Freq: Every day | ORAL | Status: DC
  Administered 2016-08-01 – 2016-08-06 (×6): 400 mg via ORAL
  Filled 2016-08-01 (×6): qty 1

## 2016-08-01 MED ORDER — FENTANYL CITRATE (PF) 250 MCG/5ML IJ SOLN
INTRAMUSCULAR | Status: AC
  Filled 2016-08-01: qty 5

## 2016-08-01 SURGICAL SUPPLY — 66 items
ADH SKN CLS APL DERMABOND .7 (GAUZE/BANDAGES/DRESSINGS) ×1
BATTALION LLIF ITRADISCAL SHIM (MISCELLANEOUS) ×2
BLADE CLIPPER SURG (BLADE) IMPLANT
CABLE BATTALION LLIF LIGHT (MISCELLANEOUS) ×1 IMPLANT
CARTRIDGE OIL MAESTRO DRILL (MISCELLANEOUS) ×1 IMPLANT
CHLORAPREP W/TINT 26ML (MISCELLANEOUS) ×3 IMPLANT
COVER BACK TABLE 24X17X13 BIG (DRAPES) IMPLANT
DECANTER SPIKE VIAL GLASS SM (MISCELLANEOUS) ×3 IMPLANT
DERMABOND ADVANCED (GAUZE/BANDAGES/DRESSINGS) ×2
DERMABOND ADVANCED .7 DNX12 (GAUZE/BANDAGES/DRESSINGS) ×2 IMPLANT
DIFFUSER DRILL AIR PNEUMATIC (MISCELLANEOUS) ×3 IMPLANT
DILATOR INSULATED XL 8X13 (MISCELLANEOUS) ×1 IMPLANT
DRAPE C-ARM 42X72 X-RAY (DRAPES) ×3 IMPLANT
DRAPE C-ARMOR (DRAPES) ×3 IMPLANT
DRAPE LAPAROTOMY 100X72X124 (DRAPES) ×3 IMPLANT
DRAPE POUCH INSTRU U-SHP 10X18 (DRAPES) ×3 IMPLANT
DRSG OPSITE POSTOP 4X6 (GAUZE/BANDAGES/DRESSINGS) ×2 IMPLANT
ELECT REM PT RETURN 9FT ADLT (ELECTROSURGICAL) ×3
ELECTRODE REM PT RTRN 9FT ADLT (ELECTROSURGICAL) ×1 IMPLANT
FEE INTRAOP MONITOR IMPULS NCS (MISCELLANEOUS) IMPLANT
GAUZE SPONGE 4X4 16PLY XRAY LF (GAUZE/BANDAGES/DRESSINGS) IMPLANT
GLOVE BIO SURGEON STRL SZ7 (GLOVE) IMPLANT
GLOVE BIOGEL PI IND STRL 7.0 (GLOVE) IMPLANT
GLOVE BIOGEL PI IND STRL 7.5 (GLOVE) ×1 IMPLANT
GLOVE BIOGEL PI INDICATOR 7.0 (GLOVE)
GLOVE BIOGEL PI INDICATOR 7.5 (GLOVE) ×2
GLOVE SS BIOGEL STRL SZ 7.5 (GLOVE) ×1 IMPLANT
GLOVE SUPERSENSE BIOGEL SZ 7.5 (GLOVE) ×2
GOWN STRL REUS W/ TWL LRG LVL3 (GOWN DISPOSABLE) ×1 IMPLANT
GOWN STRL REUS W/ TWL XL LVL3 (GOWN DISPOSABLE) ×1 IMPLANT
GOWN STRL REUS W/TWL 2XL LVL3 (GOWN DISPOSABLE) IMPLANT
GOWN STRL REUS W/TWL LRG LVL3 (GOWN DISPOSABLE) ×3
GOWN STRL REUS W/TWL XL LVL3 (GOWN DISPOSABLE) ×3
HEMOSTAT POWDER KIT SURGIFOAM (HEMOSTASIS) IMPLANT
INTRAOP MONITOR FEE IMPULS NCS (MISCELLANEOUS) ×1
INTRAOP MONITOR FEE IMPULSE (MISCELLANEOUS) ×2
KIT BASIN OR (CUSTOM PROCEDURE TRAY) ×3 IMPLANT
KIT INFUSE MEDIUM (Orthopedic Implant) ×2 IMPLANT
KIT ROOM TURNOVER OR (KITS) ×3 IMPLANT
LEAD WIRE MULTI STAGE DISP (NEUROSURGERY SUPPLIES) ×1 IMPLANT
NDL HYPO 21X1.5 SAFETY (NEEDLE) ×1 IMPLANT
NDL HYPO 25X1 1.5 SAFETY (NEEDLE) ×1 IMPLANT
NEEDLE HYPO 21X1.5 SAFETY (NEEDLE) ×3 IMPLANT
NEEDLE HYPO 25X1 1.5 SAFETY (NEEDLE) ×3 IMPLANT
NS IRRIG 1000ML POUR BTL (IV SOLUTION) ×3 IMPLANT
OIL CARTRIDGE MAESTRO DRILL (MISCELLANEOUS) ×3
PACK LAMINECTOMY NEURO (CUSTOM PROCEDURE TRAY) ×3 IMPLANT
PLATE LATERAL CAYMAN MI 10MM (Plate) ×2 IMPLANT
PROBE BALL TIP STIM 200MM 2.3M (NEUROSURGERY SUPPLIES) ×1 IMPLANT
PUTTY DBM GRAFTON 5CC (Putty) ×2 IMPLANT
SCREW CAYMAN MI 5.0X40 (Screw) ×8 IMPLANT
SHIM ITRADISCAL BATTALION LLIF (MISCELLANEOUS) IMPLANT
SPACER LORD BATT 10D 22X10X45 (Spacer) ×1 IMPLANT
SPACER LORD BATT 10D 22X10X50 (Spacer) ×4 IMPLANT
SPONGE LAP 4X18 X RAY DECT (DISPOSABLE) IMPLANT
STAPLER VISISTAT 35W (STAPLE) ×3 IMPLANT
SUT VIC AB 1 CT1 18XBRD ANBCTR (SUTURE) ×1 IMPLANT
SUT VIC AB 1 CT1 8-18 (SUTURE) ×3
SUT VIC AB 2-0 CT1 18 (SUTURE) ×3 IMPLANT
SUT VIC AB 3-0 SH 8-18 (SUTURE) ×3 IMPLANT
SYR 30ML LL (SYRINGE) ×3 IMPLANT
TIP BLUNT NITINOL ILLICO 18 (INSTRUMENTS) ×2 IMPLANT
TOWEL GREEN STERILE (TOWEL DISPOSABLE) ×2 IMPLANT
TOWEL GREEN STERILE FF (TOWEL DISPOSABLE) ×2 IMPLANT
TRAY FOLEY W/METER SILVER 16FR (SET/KITS/TRAYS/PACK) ×3 IMPLANT
WATER STERILE IRR 1000ML POUR (IV SOLUTION) ×3 IMPLANT

## 2016-08-01 NOTE — Anesthesia Postprocedure Evaluation (Signed)
Anesthesia Post Note  Patient: [name]  Procedure(s) Performed: Procedure(s) (LRB): Lumbar Two-Lumbar Five Transpsoas lateral interbody fusion with Lumbar Two-Three lateral plate fixation (N/A)  Patient location during evaluation: PACU Anesthesia Type: General Level of consciousness: awake and alert Pain management: pain level controlled Vital Signs Assessment: post-procedure vital signs reviewed and stable Respiratory status: spontaneous breathing, nonlabored ventilation, respiratory function stable and patient connected to nasal cannula oxygen Cardiovascular status: blood pressure returned to baseline and stable Postop Assessment: no signs of nausea or vomiting Anesthetic complications: no       Last Vitals:  Vitals:   08/01/16 1455 08/01/16 1510  BP: (!) 125/96 138/66  Pulse: 83 (!) 50  Resp: 18 14  Temp:      Last Pain:  Vitals:   08/01/16 1510  TempSrc:   PainSc: 4                  Lilith Solana,W. EDMOND

## 2016-08-01 NOTE — Anesthesia Procedure Notes (Signed)
Procedure Name: Intubation Date/Time: 08/01/2016 11:10 AM Performed by: Carney Living Pre-anesthesia Checklist: Patient identified, Emergency Drugs available, Suction available, Patient being monitored and Timeout performed Patient Re-evaluated:Patient Re-evaluated prior to inductionOxygen Delivery Method: Circle system utilized Preoxygenation: Pre-oxygenation with 100% oxygen Intubation Type: IV induction Ventilation: Mask ventilation without difficulty Laryngoscope Size: Mac and 4 Grade View: Grade I Tube type: Oral Tube size: 7.0 mm Number of attempts: 1 Airway Equipment and Method: Stylet Placement Confirmation: ETT inserted through vocal cords under direct vision and CO2 detector Secured at: 20 cm Tube secured with: Tape Dental Injury: Teeth and Oropharynx as per pre-operative assessment

## 2016-08-01 NOTE — Transfer of Care (Signed)
Immediate Anesthesia Transfer of Care Note  Patient: [name]  Procedure(s) Performed: Procedure(s) with comments: Lumbar Two-Lumbar Five Transpsoas lateral interbody fusion with Lumbar Two-Three lateral plate fixation (N/A) - L2-5 Transpsoas lateral interbody fusion with L2-3 lateral plate fixation  Patient Location: PACU  Anesthesia Type:General  Level of Consciousness: awake, alert  and patient cooperative  Airway & Oxygen Therapy: Patient Spontanous Breathing and Patient connected to nasal cannula oxygen  Post-op Assessment: Report given to RN, Post -op Vital signs reviewed and stable and Patient moving all extremities X 4  Post vital signs: Reviewed and stable  Last Vitals:  Vitals:   08/01/16 0859  BP: (!) 150/55  Pulse: 82  Resp: 18  Temp: 36.6 C    Last Pain:  Vitals:   08/01/16 0914  TempSrc:   PainSc: 7       Patients Stated Pain Goal: 3 (92/42/68 [id number])  Complications: No apparent anesthesia complications

## 2016-08-01 NOTE — Progress Notes (Signed)
Pt admitted to 5C15 from PACU.  Pt is sleepy but easily aroused.  Honeycomb dressing to rt lateral side is clean, dry, and intact.  Foley draining yellow urine.  SCD's on. Pt denies pain at present but will moan when moving.  Family at bedside. Bed alarm set, call bell within reach.  Will continue to monitor and assess patient.

## 2016-08-01 NOTE — Anesthesia Preprocedure Evaluation (Addendum)
Anesthesia Evaluation  Patient identified by MRN, date of birth, ID band Patient awake    Reviewed: Allergy & Precautions, H&P , NPO status , Patient's Chart, lab work & pertinent test results, reviewed documented beta blocker date and time   History of Anesthesia Complications (+) PONV  Airway Mallampati: III  TM Distance: >3 FB Neck ROM: Full    Dental no notable dental hx. (+) Edentulous Upper, Edentulous Lower, Dental Advisory Given   Pulmonary sleep apnea , COPD,  COPD inhaler, former smoker,    Pulmonary exam normal breath sounds clear to auscultation       Cardiovascular hypertension, Pt. on medications and Pt. on home beta blockers + CAD, + Past MI, + Cardiac Stents, + Peripheral Vascular Disease and +CHF   Rhythm:Irregular Rate:Normal     Neuro/Psych  Headaches, Anxiety Depression    GI/Hepatic Neg liver ROS, GERD  Medicated and Controlled,  Endo/Other  negative endocrine ROS  Renal/GU negative Renal ROS  negative genitourinary   Musculoskeletal  (+) Arthritis , Osteoarthritis,    Abdominal   Peds  Hematology negative hematology ROS (+) anemia ,   Anesthesia Other Findings   Reproductive/Obstetrics negative OB ROS                           Anesthesia Physical Anesthesia Plan  ASA: III  Anesthesia Plan: General   Post-op Pain Management:    Induction: Intravenous  Airway Management Planned: Oral ETT  Additional Equipment: Arterial line  Intra-op Plan:   Post-operative Plan: Extubation in OR and Possible Post-op intubation/ventilation  Informed Consent: I have reviewed the patients History and Physical, chart, labs and discussed the procedure including the risks, benefits and alternatives for the proposed anesthesia with the patient or authorized representative who has indicated his/her understanding and acceptance.   Dental advisory given  Plan Discussed with:  CRNA  Anesthesia Plan Comments:         Anesthesia Quick Evaluation

## 2016-08-01 NOTE — H&P (Signed)
CC: low back pain  HPI: [name] is a 83 y.o. female who complains of worsening pain and numbness in her legs.   This is a longstanding problem for the past several years with gradual worsening. She has failed conservative treatment.  PMH: Past Medical History:  Diagnosis Date  . Anxiety   . Arthritis   . Atrial fibrillation ([place])   . Bursitis    left shoulder  . Cataract   . CHF (congestive heart failure) (HCC)    EF 55-60%  . Complication of anesthesia   . COPD (chronic obstructive pulmonary disease) ([place])   . Coronary atherosclerosis of native coronary artery    a. DES to CX, moderately severe stenosis RCA, mild stenosis LAD 04/2013  . Depression   . Dysphagia, unspecified(787.20)   . GERD (gastroesophageal reflux disease)    Hx Schatzki's ring, multiple EGD/ED last 01/06/2004  . Headache   . Heart disease   . Heart murmur    'a littel'  . History of anemia   . HTN (hypertension)    Hx of it  . Hyperlipemia   . Hyperlipidemia   . Internal hemorrhoids without mention of complication   . MI (myocardial infarction) ([place]) 2006  . Microscopic colitis 2003  . Other and unspecified hyperlipidemia   . Panic disorder without agoraphobia   . Paresthesia    hands, feet  . Pneumonia 12/2011  . PONV (postoperative nausea and vomiting)    'a little nausea"  . PVD (peripheral vascular disease) ([place])   . S/P colonoscopy 09/27/2001   internal hemorrhoids, desc colon inflam polyp, SB BX-chronic duodenitis, colitis  . Shortness of breath   . Sleep disorder    obstructive  . Thyroid disease    recent abnl TSH per pt    PSH: Past Surgical History:  Procedure Laterality Date  . ABDOMINAL HYSTERECTOMY    . ANTERIOR AND POSTERIOR REPAIR     with resection of vagina  . APPENDECTOMY    . BACK SURGERY    . BIOPSY  07/05/2015   Procedure: BIOPSY;  Surgeon: Daneil Dolin, MD;  [facility];  Service: Endoscopy;;  gastric polyp biopsy, ascending colon biopsy  .  BLADDER SUSPENSION  11/09/2011   Procedure: TRANSVAGINAL TAPE (TVT) PROCEDURE;  Surgeon: Marissa Nestle, MD;  Location: [facility] ORS;  Service: Urology;  Laterality: N/A;  . bladder tack  06/2010  . BREAST LUMPECTOMY  1998   left, benign  . CARDIAC CATHETERIZATION    . CARDIAC CATHETERIZATION    . CARDIAC CATHETERIZATION N/A 12/16/2015   Procedure: Left Heart Cath and Coronary Angiography;  Surgeon: Troy Sine, MD;  Location: [place] CV LAB;  Service: Cardiovascular;  Laterality: N/A;  . [place]   left  . CHOLECYSTECTOMY  1998  . COLONOSCOPY  03/16/2011   multiple hyperplastic colon polyps, sigmoid diverticulosis, melanosis coli  . COLONOSCOPY WITH PROPOFOL N/A 07/05/2015   RMR:one 5 mm polyp in descending colon  . CORONARY ANGIOPLASTY WITH STENT PLACEMENT    . ESOPHAGEAL DILATION N/A 07/05/2015   Procedure: ESOPHAGEAL DILATION;  Surgeon: Daneil Dolin, MD;  [facility]: [facility];  Service: Endoscopy;  Laterality: N/A;  . ESOPHAGOGASTRODUODENOSCOPY (EGD) WITH PROPOFOL N/A 07/05/2015   WHQ:PRFFMB  . JOINT REPLACEMENT Right 2007   right knee  . left hand surgery    . LEFT HEART CATHETERIZATION WITH CORONARY ANGIOGRAM N/A 05/14/2013   Procedure: LEFT HEART CATHETERIZATION WITH CORONARY ANGIOGRAM;  Surgeon: Blane Ohara, MD;  Location: [facility] CATH LAB;  Service: Cardiovascular;  Laterality: N/A;  . left rotator cuff surgery    . LUMBAR LAMINECTOMY/DECOMPRESSION MICRODISCECTOMY N/A 10/11/2012   Procedure: LUMBAR LAMINECTOMY/DECOMPRESSION MICRODISCECTOMY 2 LEVELS;  Surgeon: Floyce Stakes, MD;  Location: [place] NEURO ORS;  Service: Neurosurgery;  Laterality: N/A;  L3-4 L4-5 Laminectomy  . LUMBAR WOUND DEBRIDEMENT N/A 09/27/2015   Procedure: Exploration of Lumbar Wound w/ Repair CSF Leak/Lumbar Drain Placement;  Surgeon: Leeroy Cha, MD;  Location: [place] NEURO ORS;  Service: Neurosurgery;  Laterality: N/A;  . MALONEY DILATION  03/16/2011   Gastritis. No H.pylori on bx. 2F  maloney dilation with disruption of  occult cevical esophageal web  . NASAL SINUS SURGERY    . right knee replacement  2007  . right leg benign tumor    . SHOULDER SURGERY Left   . TONSILLECTOMY    . unspecified area, hysterectomy  1972   partial    SH: Social History  Substance Use Topics  . Smoking status: Former Smoker    Packs/day: 1.00    Years: 64.00    Types: Cigarettes    Start date: 12/24/1947    Quit date: 11/17/2001  . Smokeless tobacco: Never Used     Comment: Quit smoking in 2003  . Alcohol use No    MEDS: Prior to Admission medications   Medication Sig Start Date End Date Taking? Authorizing Provider  ALPRAZolam (XANAX) 0.25 MG tablet Take 0.25 mg by mouth 2 (two) times daily as needed (for anxiety/panic attacks).    Yes Historical Provider, MD  apixaban (ELIQUIS) 5 MG TABS tablet Take 1 tablet (5 mg total) by mouth 2 (two) times daily. 03/24/16  Yes Herminio Commons, MD  DULoxetine (CYMBALTA) 60 MG capsule Take 60 mg by mouth at bedtime.    Yes Historical Provider, MD  fluticasone (FLONASE) 50 MCG/ACT nasal spray Place 2 sprays into both nostrils daily.    Yes Historical Provider, MD  furosemide (LASIX) 40 MG tablet TAKE 40 MG TWICE A DAY FOR 3 DAYS, THEN TAKE 40 MG DAILY FROM THEN ON Patient taking differently: Take 40 mg by mouth daily.  04/27/16  Yes Herminio Commons, MD  hydrocortisone (ANUSOL-HC) 2.5 % rectal cream Place 1 application rectally 2 (two) times daily. Patient taking differently: Place 1 application rectally 2 (two) times daily as needed (for irritated rectum/itching).  03/01/16  Yes Annitta Needs, NP  ipratropium-albuterol (DUONEB) 0.5-2.5 (3) MG/3ML SOLN Take 3 mLs by nebulization every 6 (six) hours. Patient taking differently: Take 3 mLs by nebulization every 6 (six) hours as needed (for wheezin/shortness of breath).  12/18/15  Yes Belkys A Regalado, MD  Magnesium 400 MG TABS Take 400 mg by mouth daily. 04/28/16  Yes Herminio Commons, MD   metoprolol succinate (TOPROL-XL) 50 MG 24 hr tablet Take 1 1/2 tablets (75 mg ) daily Patient taking differently: Take 75 mg by mouth daily. Take 1 1/2 tablets (75 mg ) daily 06/12/16  Yes Herminio Commons, MD  Multiple Vitamin (MULTIVITAMIN WITH MINERALS) TABS tablet Take 1 tablet by mouth daily with lunch. Centrum   Yes Historical Provider, MD  pantoprazole (PROTONIX) 40 MG tablet Take 40 mg by mouth daily.    Yes Historical Provider, MD  acetaminophen (TYLENOL) 500 MG tablet Take 500 mg by mouth daily as needed for headache.     Historical Provider, MD  bisacodyl (DULCOLAX) 5 MG EC tablet Take 1 tablet (5 mg total) by  mouth daily as needed for moderate constipation. Patient not taking: Reported on 07/19/2016 04/18/16   Thurnell Lose, MD  gabapentin (NEURONTIN) 300 MG capsule Take 1 capsule (300 mg total) by mouth 3 (three) times daily. Patient not taking: Reported on 07/19/2016 10/08/15   Kevan Ny Davine Coba, MD  hydrocortisone (ANUSOL-HC) 25 MG suppository Place 1 suppository (25 mg total) rectally every 12 (twelve) hours. Patient taking differently: Place 25 mg rectally 2 (two) times daily as needed (for irritated rectum/itching).  04/26/16   Carlis Stable, NP  nitroGLYCERIN (NITROSTAT) 0.4 MG SL tablet Place 0.4 mg under the tongue every 5 (five) minutes as needed for chest pain. Reported on 08/04/2015    Historical Provider, MD    ALLERGY: Allergies  Allergen Reactions  . Cephalosporins Diarrhea and Nausea Only    Lightheaded  . Levaquin [Levofloxacin In D5w] Swelling  . Macrodantin [Nitrofurantoin Macrocrystal] Swelling  . Phenothiazines Anaphylaxis and Hives  . Polysorbate Anaphylaxis  . Prednisone Shortness Of Breath  . Buspirone Itching  . Cardura [Doxazosin Mesylate] Itching  . Codeine Itching  . Acyclovir And Related Itching    Redness of skin  . Prochlorperazine Other (See Comments)    "Upset stomach"  . Ranexa [Ranolazine]     Severe drop in BP  . Atorvastatin Hives     Cramping; tolerates Crestor ok  . Ofloxacin Rash  . Other Itching and Rash    "WOOL"= make skin look like it has been burned  . Penicillins Other (See Comments)    Causes redness all over. Has patient had a PCN reaction causing immediate rash, facial/tongue/throat swelling, SOB or lightheadedness with hypotension: No Has patient had a PCN reaction causing severe rash involving mucus membranes or skin necrosis: No Has patient had a PCN reaction that required hospitalization No Has patient had a PCN reaction occurring within the last 10 years: No If all of the above answers are "NO", then may proceed with Cephalosporin use.   . Pimozide Hives and Itching    ROS: ROS  Vitals:   08/01/16 0859  BP: (!) 150/55  Pulse: 82  Resp: 18  Temp: 97.8 F (36.6 C)   General appearance: WDWN, NAD Eyes: PERRL, Fundoscopic: normal Cardiovascular: Regular rate and rhythm without murmurs, rubs, gallops. No edema or variciosities. Distal pulses normal. Pulmonary: Clear to auscultation Musculoskeletal:     Muscle tone upper extremities: Normal    Muscle tone lower extremities: Normal    Motor exam: Upper Extremities Deltoid Bicep Tricep Grip  Right 5/5 5/5 5/5 5/5  Left 5/5 5/5 5/5 5/5   Lower Extremity IP Quad PF DF EHL  Right 4/5 4/5 4/5 4/5 4/5  Left 5/5 5/5 5/5 5/5 5/5   Neurological Awake, alert, oriented Memory and concentration grossly intact Speech fluent, appropriate CNII: Visual fields normal CNIII/IV/VI: EOMI CNV: Facial sensation normal CNVII: Symmetric, normal strength CNVIII: Grossly normal CNIX: Normal palate movement CNXI: Trap and SCM strength normal CN XII: Tongue protrusion normal Sensation grossly intact to LT DTR: Normal Coordination (finger/nose & heel/shin): Normal  IMAGING:  I reviewed her lumbar myelogram and post-myelo CT.  She has a new retrolesthesis at L2-3 with lateral recess stenosis and nerve root compression.  There is evidence of early loosening  of the screws at L3 and L5.   IMPRESSION/PLAN - 83 y.o. female with chronic low back pain. Does have some weakness of the right lower extremity but otherwise neurologically appears well.  I have recommended L2-3 lateral interbody fusion with  lateral plating to address her retrolisthesis and radicular symptoms.  Because she has some screw loosening I think we should also perform L3-4 and L4-5 lateral interbody fusion to provide additional stability and take some stress off the pedicle screws.  We have discussed the risks, benefits, and alternatives to surgery and she wishes to proceed.

## 2016-08-01 NOTE — Anesthesia Procedure Notes (Signed)
Arterial Line Insertion Start/End4/17/2018 10:00 AM, 08/01/2016 10:20 AM Performed by: Lelon Perla A, CRNA  Patient location: Pre-op. Preanesthetic checklist: patient identified, IV checked, site marked, surgical consent and pre-op evaluation Lidocaine 1% used for infiltration Left, radial was placed Catheter size: 20 G Hand hygiene performed  and maximum sterile barriers used   Attempts: 2 Procedure performed without using ultrasound guided technique. Following insertion, dressing applied and Biopatch. Post procedure assessment: normal  Patient tolerated the procedure well with no immediate complications.

## 2016-08-01 NOTE — Brief Op Note (Signed)
08/01/2016  2:11 PM  PATIENT:  [name]  83 y.o. female  PRE-OPERATIVE DIAGNOSIS:  Spondylolisthesis of lumbar region  POST-OPERATIVE DIAGNOSIS:  Spondylolisthesis of lumbar region  PROCEDURE:  Procedure(s) with comments: L2-5 Transpsoas lateral interbody fusion with L2-3 lateral plate fixation (N/A) - L2-5 Transpsoas lateral interbody fusion with L2-3 lateral plate fixation  SURGEON:  Surgeon(s) and Role:    * Tamala Fothergill, MD - Primary    * Kristeen Miss, MD - Assisting  PHYSICIAN ASSISTANT:   ASSISTANTS: Kristeen Miss, MD  ANESTHESIA:   general  EBL:  Total I/O In: 1000 [I.V.:1000] Out: 350 [Urine:150; Blood:200]  BLOOD ADMINISTERED:none  DRAINS: none   LOCAL MEDICATIONS USED:  MARCAINE    and LIDOCAINE   SPECIMEN:  No Specimen  DISPOSITION OF SPECIMEN:  N/A  COUNTS:  YES  TOURNIQUET:  * No tourniquets in log *  DICTATION: .Dragon Dictation  PLAN OF CARE: Admit to inpatient   PATIENT DISPOSITION:  PACU - hemodynamically stable.   Delay start of Pharmacological VTE agent (>24hrs) due to surgical blood loss or risk of bleeding: yes

## 2016-08-02 ENCOUNTER — Encounter (HOSPITAL_COMMUNITY): Payer: Self-pay | Admitting: Neurological Surgery

## 2016-08-02 LAB — BASIC METABOLIC PANEL
Anion gap: 8 (ref 5–15)
BUN: 13 mg/dL (ref 6–20)
CHLORIDE: 102 mmol/L (ref 101–111)
CO2: 27 mmol/L (ref 22–32)
Calcium: 8.4 mg/dL — ABNORMAL LOW (ref 8.9–10.3)
Creatinine, Ser: 1.15 mg/dL — ABNORMAL HIGH (ref 0.44–1.00)
GFR calc Af Amer: 50 mL/min — ABNORMAL LOW (ref >60)
GFR calc non Af Amer: 43 mL/min — ABNORMAL LOW (ref >60)
Glucose, Bld: 113 mg/dL — ABNORMAL HIGH (ref 65–99)
POTASSIUM: 3.7 mmol/L (ref 3.5–5.1)
Sodium: 137 mmol/L (ref 135–145)

## 2016-08-02 LAB — CBC
HEMATOCRIT: 29 % — AB (ref 36.0–46.0)
HEMOGLOBIN: 8.8 g/dL — AB (ref 12.0–15.0)
MCH: 24.2 pg — ABNORMAL LOW (ref 26.0–34.0)
MCHC: 30.3 g/dL (ref 30.0–36.0)
MCV: 79.9 fL (ref 78.0–100.0)
Platelets: 248 10*3/uL (ref 150–400)
RBC: 3.63 MIL/uL — ABNORMAL LOW (ref 3.87–5.11)
RDW: 17.8 % — ABNORMAL HIGH (ref 11.5–15.5)
WBC: 8.2 10*3/uL (ref 4.0–10.5)

## 2016-08-02 NOTE — Progress Notes (Signed)
Pt seen and examined.  No issues overnight. Reports pain 0/10 Ambulating with assistance  EXAM: Temp:  [97.6 F (36.4 C)-97.9 F (36.6 C)] 97.9 F (36.6 C) (04/18 0615) Pulse Rate:  [18-100] 18 (04/18 0615) Resp:  [14-18] 16 (04/18 0615) BP: (104-151)/(51-96) 104/51 (04/18 0615) SpO2:  [93 %-100 %] 93 % (04/18 0615) Arterial Line BP: (135-141)/(57-63) 138/57 (04/17 1510) Weight:  [66.7 kg (147 lb)-66.7 kg (147 lb 1 oz)] 66.7 kg (147 lb) (04/17 0914) Intake/Output      04/17 0701 - 04/18 0700 04/18 0701 - 04/19 0700   P.O. 100    I.V. (mL/kg) 3140 (47.1)    Total Intake(mL/kg) 3240 (48.6)    Urine (mL/kg/hr) 1710    Blood 200    Total Output 1910     Net +1330           Awake and alert Follows commands throughout Tremulous Full strength Wound c/d/i  Stable Continue current care Work with PT/OT

## 2016-08-02 NOTE — Progress Notes (Signed)
Patient ID: [name], female   DOB: 1932/06/09, 83 y.o.   MRN: [id number] Vital signs are stable Patient appears to be doing reasonably well She is walk several times today She wishes to go to a rehabilitation center/sniff we'll see if this is being arranged.

## 2016-08-02 NOTE — Evaluation (Addendum)
Occupational Therapy Evaluation Patient Details Name: [name] MRN: [id number] DOB: July 28, 1932 Today's Date: 08/02/2016    History of Present Illness Pt is an 83 y.o. female s/p L2-L5 transpsoas lateral interbody fusion with L2-3 lateral plate fixation. She has a PMH significant for atrial fibrillation, CHF, COPD, GERD, dysphagia, heart murmur, hyperlipidemia, MI, paresthesia, pneumonia, and PVD.    Clinical Impression   PTA, pt was independent with basic ADL and utilized rollator for functional mobility. Pt did require assistance for home management tasks from daughter. Pt currently requires max assist for dressing tasks and min assist for toilet transfers with RW.  Pt would prefer to return home but is agreeable to SNF if needed. Feel pt may progress to be able to return home with 24 hour assistance from family. At current functional level, recommend short-term SNF placement for continued rehabilitation in preparation for return home with assistance from family. Will continue to follow acutely and update D/C recommendations as necessary.    Follow Up Recommendations  SNF;Supervision/Assistance - 24 hour    Equipment Recommendations  None recommended by OT    Recommendations for Other Services       Precautions / Restrictions Precautions Precautions: Back Precaution Booklet Issued: No Precaution Comments: Verbally reviewed precautions with pt Restrictions Weight Bearing Restrictions: No      Mobility Bed Mobility Overal bed mobility: Needs Assistance Bed Mobility: Rolling;Sidelying to Sit Rolling: Min guard Sidelying to sit: Mod assist       General bed mobility comments: Mod assist to raise trunk from bed. Max VC's for log roll technique.  Transfers Overall transfer level: Needs assistance Equipment used: Rolling walker (2 wheeled) Transfers: Sit to/from Stand Sit to Stand: Min assist         General transfer comment: Min assist for steadying.    Balance  Overall balance assessment: Needs assistance Sitting-balance support: No upper extremity supported;Feet supported Sitting balance-Leahy Scale: Fair     Standing balance support: No upper extremity supported;During functional activity;Bilateral upper extremity supported Standing balance-Leahy Scale: Fair Standing balance comment: Able to stand at sink without UE support for grooming tasks.                           ADL either performed or assessed with clinical judgement   ADL Overall ADL's : Needs assistance/impaired Eating/Feeding: Set up;Sitting   Grooming: Min guard;Standing   Upper Body Bathing: Moderate assistance;Sitting   Lower Body Bathing: Maximal assistance;Sit to/from stand   Upper Body Dressing : Maximal assistance;Sitting   Lower Body Dressing: Maximal assistance;Sit to/from stand   Toilet Transfer: Minimal assistance;Ambulation;RW;BSC   Toileting- Clothing Manipulation and Hygiene: Maximal assistance;Sit to/from stand       Functional mobility during ADLs: Minimal assistance;Rolling walker General ADL Comments: Pt educated on compensatory strategies during ADL to adhere to back precautions.     Vision Patient Visual Report: No change from baseline Vision Assessment?: No apparent visual deficits     Perception     Praxis      Pertinent Vitals/Pain Pain Assessment: No/denies pain     Hand Dominance Right   Extremity/Trunk Assessment Upper Extremity Assessment Upper Extremity Assessment: Generalized weakness   Lower Extremity Assessment Lower Extremity Assessment: Generalized weakness   Cervical / Trunk Assessment Cervical / Trunk Assessment: Other exceptions Cervical / Trunk Exceptions: s/p surgery   Communication Communication Communication: No difficulties   Cognition Arousal/Alertness: Awake/alert Behavior During Therapy: WFL for tasks assessed/performed Overall Cognitive Status:  No family/caregiver present to determine  baseline cognitive functioning                                 General Comments: Pt frequently talking off topic but able to problem solve for basic ADL well. Difficulty with higher level cognitive skills noted.   General Comments       Exercises     Shoulder Instructions      Home Living Family/patient expects to be discharged to:: Private residence Living Arrangements: Children Available Help at Discharge: Family;Available 24 hours/day Type of Home: House Home Access: Ramped entrance     Home Layout: One level     Bathroom Shower/Tub: Tub/shower unit         Home Equipment: Grab bars - tub/shower;Hand held shower head;Tub bench;Bedside commode;Cane - single point;Walker - 4 wheels;Adaptive equipment Adaptive Equipment: Reacher;Sock aid        Prior Functioning/Environment Level of Independence: Needs assistance  Gait / Transfers Assistance Needed: Ambulating with rollator ADL's / Homemaking Assistance Needed: Independent with basic ADL but daughter assists with home management.            OT Problem List: Decreased strength;Impaired balance (sitting and/or standing);Decreased safety awareness;Decreased activity tolerance;Decreased knowledge of use of DME or AE;Decreased knowledge of precautions;Pain      OT Treatment/Interventions: Self-care/ADL training;Therapeutic exercise;Energy conservation;DME and/or AE instruction;Therapeutic activities;Visual/perceptual remediation/compensation;Cognitive remediation/compensation;Patient/family education;Balance training    OT Goals(Current goals can be found in the care plan section) Acute Rehab OT Goals Patient Stated Goal: to go home OT Goal Formulation: With patient Time For Goal Achievement: 08/16/16 Potential to Achieve Goals: Good ADL Goals Pt Will Perform Grooming: with modified independence;standing Pt Will Perform Lower Body Bathing: sit to/from stand;with adaptive equipment;with modified  independence Pt Will Perform Upper Body Dressing: with supervision;sitting (including brace) Pt Will Perform Lower Body Dressing: with modified independence;with adaptive equipment;sit to/from stand Pt Will Transfer to Toilet: with modified independence;ambulating;bedside commode Pt Will Perform Toileting - Clothing Manipulation and hygiene: with modified independence;sit to/from stand;with adaptive equipment Pt Will Perform Tub/Shower Transfer: Tub transfer;shower seat;rolling walker;tub bench;with supervision Additional ADL Goal #1: Pt will complete bed mobility adhering to back precautions with modified independence in preparation for ADL tasks seated at EOB.  OT Frequency: Min 2X/week   Barriers to D/C:            Co-evaluation              End of Session Equipment Utilized During Treatment: Rolling walker;Back brace Nurse Communication: Mobility status  Activity Tolerance: Patient tolerated treatment well Patient left: in chair;with call bell/phone within reach;with chair alarm set  OT Visit Diagnosis: Unsteadiness on feet (R26.81);Other abnormalities of gait and mobility (R26.89);Pain Pain - Right/Left: Right Pain - part of body: Leg (Back)                Time: 0263-7858 OT Time Calculation (min): 40 min Charges:  OT General Charges $OT Visit: 1 Procedure OT Evaluation $OT Eval Moderate Complexity: 1 Procedure OT Treatments $Self Care/Home Management : 23-37 mins G-Codes:     Norman Herrlich, MS OTR/L  Pager: [place] A [name] 08/02/2016, 11:07 AM

## 2016-08-02 NOTE — Evaluation (Addendum)
Physical Therapy Evaluation Patient Details Name: [name] MRN: [id number] DOB: 1932/05/05 Today's Date: 08/02/2016   History of Present Illness  Pt is an 83 y.o. female s/p L2-L5 transpsoas lateral interbody fusion with L2-3 lateral plate fixation. She has a PMH significant for atrial fibrillation, CHF, COPD, GERD, dysphagia, heart murmur, hyperlipidemia, MI, paresthesia, pneumonia, and PVD.   Clinical Impression  Patient is s/p above surgery resulting in the deficits listed below (see PT Problem List). PTA, pt was independent with mobility using a rollator. Upon evaluation, pt with SOB during ambulation and generalized weakness which limited ambulation tolerance. Oxygen sats appropriate throughout. Educated about SNF at d/c to increase independence and tolerance for activity. May be able to update recommendations pending pt progress.  Patient will benefit from skilled PT to increase their independence and safety with mobility (while adhering to their precautions) to allow discharge to the venue listed below. Will continue to follow and update d/c recommendations according to pt progression.      Follow Up Recommendations SNF;Supervision/Assistance - 24 hour    Equipment Recommendations  Rolling walker with 5" wheels    Recommendations for Other Services       Precautions / Restrictions Precautions Precautions: Back Precaution Booklet Issued: Yes (comment) Precaution Comments: Pt able to recall 1/3 precautions. Precaution handout administered and reviewed.  Required Braces or Orthoses: Spinal Brace Spinal Brace: Thoracolumbosacral orthotic;Applied in sitting position Restrictions Weight Bearing Restrictions: No      Mobility  Bed Mobility Overal bed mobility: Needs Assistance Bed Mobility: Rolling;Sidelying to Sit;Sit to Sidelying Rolling: Min guard Sidelying to sit: HOB elevated;Min assist     Sit to sidelying: Min assist General bed mobility comments: Min A for trunk  elevation with elevated HOB. Min A for trunk lowering onto flat surface for controlled descent. Verbal cues throughout for log roll technique. Heavy use of bed rails.  Transfers Overall transfer level: Needs assistance Equipment used: Rolling walker (2 wheeled) Transfers: Sit to/from Stand Sit to Stand: Min assist         General transfer comment: Min A for steadying upon standing. Verbal cues for appropraite UE placement.   Ambulation/Gait Ambulation/Gait assistance: Min guard Ambulation Distance (Feet): 50 Feet Assistive device: Rolling walker (2 wheeled) Gait Pattern/deviations: Step-through pattern;Decreased stride length;Trunk flexed Gait velocity: Decreased Gait velocity interpretation: Below normal speed for age/gender General Gait Details: Distance limited secondary to SOB and fatigue. Oxygen sats at 90% and above throughout gait. Pt requiring verbal cues for appropriate upright posture throughout.   Stairs            Wheelchair Mobility    Modified Rankin (Stroke Patients Only)       Balance Overall balance assessment: Needs assistance Sitting-balance support: No upper extremity supported;Feet supported Sitting balance-Leahy Scale: Fair     Standing balance support: Bilateral upper extremity supported;During functional activity Standing balance-Leahy Scale: Poor Standing balance comment: Reliant on RW for steadying                              Pertinent Vitals/Pain Pain Assessment: 0-10 Pain Score: 3  Pain Location: back  Pain Descriptors / Indicators: Aching;Operative site guarding;Sore Pain Intervention(s): Limited activity within patient's tolerance;Monitored during session;Repositioned    Home Living Family/patient expects to be discharged to:: Private residence Living Arrangements: Children Available Help at Discharge: Family;Available 24 hours/day Type of Home: House Home Access: Ramped entrance     Home Layout: One level Home  Equipment: Grab bars - tub/shower;Hand held shower head;Tub bench;Bedside commode;Cane - single point;Walker - 4 wheels;Adaptive equipment      Prior Function Level of Independence: Needs assistance   Gait / Transfers Assistance Needed: Ambulating with rollator  ADL's / Homemaking Assistance Needed: Independent with basic ADL but daughter assists with home management.        Hand Dominance   Dominant Hand: Right    Extremity/Trunk Assessment   Upper Extremity Assessment Upper Extremity Assessment: Defer to OT evaluation    Lower Extremity Assessment Lower Extremity Assessment: Generalized weakness    Cervical / Trunk Assessment Cervical / Trunk Assessment: Other exceptions Cervical / Trunk Exceptions: s/p surgery  Communication   Communication: No difficulties  Cognition Arousal/Alertness: Awake/alert Behavior During Therapy: WFL for tasks assessed/performed Overall Cognitive Status: Within Functional Limits for tasks assessed                                 General Comments: Pt frequently talking off topic but able to problem solve for basic ADL well. Difficulty with higher level cognitive skills noted.      General Comments General comments (skin integrity, edema, etc.): Educated about SNF at d/c for further PT. Pt and family agreeable; pt's daughter wanting her to go.     Exercises     Assessment/Plan    PT Assessment Patient needs continued PT services  PT Problem List Decreased strength;Decreased activity tolerance;Decreased mobility;Decreased balance;Decreased knowledge of use of DME;Decreased knowledge of precautions;Cardiopulmonary status limiting activity;Pain       PT Treatment Interventions DME instruction;Gait training;Functional mobility training;Therapeutic activities;Therapeutic exercise;Balance training;Neuromuscular re-education;Patient/family education    PT Goals (Current goals can be found in the Care Plan section)  Acute Rehab  PT Goals Patient Stated Goal: to go home PT Goal Formulation: With patient Time For Goal Achievement: 08/09/16 Potential to Achieve Goals: Good    Frequency Min 5X/week   Barriers to discharge        Co-evaluation               End of Session Equipment Utilized During Treatment: Gait belt;Oxygen;Back brace Activity Tolerance: Patient limited by fatigue Patient left: in bed;with call bell/phone within reach;with bed alarm set;with family/visitor present Nurse Communication: Mobility status PT Visit Diagnosis: Other abnormalities of gait and mobility (R26.89);Pain Pain - part of body:  (back )    Time: 9937-1696 PT Time Calculation (min) (ACUTE ONLY): 24 min   Charges:   PT Evaluation $PT Eval Low Complexity: 1 Procedure PT Treatments $Gait Training: 8-22 mins   PT G Codes:        Nicky Pugh, PT, DPT  Acute Rehabilitation Services  Pager: [phone]   Army Melia 08/02/2016, 12:45 PM

## 2016-08-02 NOTE — Progress Notes (Signed)
Stopped by to visit w/ pt, but she was asleep. Will stop back another time.   08/02/16 1500  Clinical Encounter Type  Visited With Patient not available  Visit Type Initial;Psychological support;Spiritual support;Social support  Referral From Arden Oriyah Lamphear, Chaplain

## 2016-08-02 NOTE — Progress Notes (Signed)
Foley d/c'd per order, patient walked in hallway with one assist, rolling walker approximately  10 yards, no complaints at this time.  Continue to monitor patient.

## 2016-08-03 LAB — CBC
HCT: 27.2 % — ABNORMAL LOW (ref 36.0–46.0)
HEMOGLOBIN: 8.6 g/dL — AB (ref 12.0–15.0)
MCH: 25.4 pg — ABNORMAL LOW (ref 26.0–34.0)
MCHC: 31.6 g/dL (ref 30.0–36.0)
MCV: 80.5 fL (ref 78.0–100.0)
Platelets: 240 10*3/uL (ref 150–400)
RBC: 3.38 MIL/uL — ABNORMAL LOW (ref 3.87–5.11)
RDW: 18 % — ABNORMAL HIGH (ref 11.5–15.5)
WBC: 8.9 10*3/uL (ref 4.0–10.5)

## 2016-08-03 NOTE — Progress Notes (Signed)
Patient ID: [name], female   DOB: [birth year], 83 y.o.   MRN: [id number] Vital signs with some hypotension and tachycardia Legs gave out today and patient was tremulous Upset over spilling coffee in bed Note preop hemoglobin 10 Will check post op cbc

## 2016-08-03 NOTE — Progress Notes (Signed)
Physical Therapy Treatment Patient Details Name: [name] MRN: [id number] DOB: 15-Oct-1932 Today's Date: 08/03/2016    History of Present Illness Pt is an 83 y.o. female s/p L2-L5 transpsoas lateral interbody fusion with L2-3 lateral plate fixation. She has a PMH significant for atrial fibrillation, CHF, COPD, GERD, dysphagia, heart murmur, hyperlipidemia, MI, paresthesia, pneumonia, and PVD.     PT Comments    Making steady improvement with gait stability hindered by bil knee buckling.    Follow Up Recommendations  SNF;Supervision/Assistance - 24 hour     Equipment Recommendations  Rolling walker with 5" wheels    Recommendations for Other Services       Precautions / Restrictions Precautions Precautions: Back Precaution Comments: reviewed precautions Required Braces or Orthoses: Spinal Brace Spinal Brace: Thoracolumbosacral orthotic;Applied in sitting position    Mobility  Bed Mobility Overal bed mobility: Needs Assistance Bed Mobility: Rolling;Sidelying to Sit Rolling: Min guard Sidelying to sit: Min guard       General bed mobility comments: use of the rail, but no assist needed  Transfers Overall transfer level: Needs assistance Equipment used: Rolling walker (2 wheeled) Transfers: Sit to/from Stand Sit to Stand: Min assist         General transfer comment: cues for hand placement and steadying assist  Ambulation/Gait Ambulation/Gait assistance: Min assist Ambulation Distance (Feet): 15 Feet (then 70 feet) Assistive device: Rolling walker (2 wheeled) Gait Pattern/deviations: Step-through pattern Gait velocity: Decreased Gait velocity interpretation: Below normal speed for age/gender General Gait Details: marked by frequent sudden buckling or either knee, with pt generally "catching" herself and re extending, but very disconcerting for therapist and pt.   Stairs            Wheelchair Mobility    Modified Rankin (Stroke Patients Only)        Balance Overall balance assessment: Needs assistance   Sitting balance-Leahy Scale: Fair     Standing balance support: Bilateral upper extremity supported;During functional activity Standing balance-Leahy Scale: Poor Standing balance comment: Reliant on RW for steadying                             Cognition Arousal/Alertness: Awake/alert Behavior During Therapy: WFL for tasks assessed/performed Overall Cognitive Status: Within Functional Limits for tasks assessed                                        Exercises      General Comments General comments (skin integrity, edema, etc.): Pt reluctant to sit up and needed encouragement      Pertinent Vitals/Pain Pain Assessment: Faces Faces Pain Scale: Hurts little more Pain Location: back  Pain Descriptors / Indicators: Aching;Operative site guarding;Sore Pain Intervention(s): Monitored during session;Repositioned    Home Living                      Prior Function            PT Goals (current goals can now be found in the care plan section) Acute Rehab PT Goals Patient Stated Goal: to go home PT Goal Formulation: With patient Time For Goal Achievement: 08/09/16 Potential to Achieve Goals: Good Progress towards PT goals: Progressing toward goals    Frequency    Min 5X/week      PT Plan Current plan remains appropriate    Co-evaluation  End of Session Equipment Utilized During Treatment: Gait belt;Oxygen;Back brace Activity Tolerance: Patient limited by fatigue Patient left: in chair;with call bell/phone within reach;with chair alarm set Nurse Communication: Mobility status PT Visit Diagnosis: Other abnormalities of gait and mobility (R26.89);Pain Pain - part of body:  (back)     Time: 4035-2481 PT Time Calculation (min) (ACUTE ONLY): 25 min  Charges:  $Gait Training: 8-22 mins $Therapeutic Activity: 8-22 mins                    G Codes:        08-10-2016  Donnella Sham, PT [phone]  (pager)   Tessie Fass Rashonda Warrior Aug 10, 2016, 5:01 PM

## 2016-08-03 NOTE — Progress Notes (Signed)
Patient with complaints of hand tremors stating "I just want my hands to work, I don't know what's wrong with them." Patient given emotional support as tears in eyes at the time.  Continue to monitor.

## 2016-08-03 NOTE — Progress Notes (Signed)
Occupational Therapy Treatment Patient Details Name: [name] MRN: [id number] DOB: June 20, 1932 Today's Date: 08/03/2016    History of present illness Pt is an 83 y.o. female s/p L2-L5 transpsoas lateral interbody fusion with L2-3 lateral plate fixation. She has a PMH significant for atrial fibrillation, CHF, COPD, GERD, dysphagia, heart murmur, hyperlipidemia, MI, paresthesia, pneumonia, and PVD.    OT comments  Pt progressing towards acute OT goals. Focus of session was bed mobility, grooming standing at sink, and reinforcement of back precaution education and transfer technique with rw, D/c plan to SNF still appropriate.   Follow Up Recommendations  SNF;Supervision/Assistance - 24 hour    Equipment Recommendations  None recommended by OT    Recommendations for Other Services      Precautions / Restrictions Precautions Precautions: Back Precaution Comments: reviewed precautions Required Braces or Orthoses: Spinal Brace Spinal Brace: Thoracolumbosacral orthotic;Applied in sitting position Restrictions Weight Bearing Restrictions: No       Mobility Bed Mobility Overal bed mobility: Needs Assistance Bed Mobility: Rolling;Sidelying to Sit;Sit to Sidelying Rolling: Min guard Sidelying to sit: Min guard     Sit to sidelying: Min guard General bed mobility comments: close min guard for safety  Transfers Overall transfer level: Needs assistance Equipment used: Rolling walker (2 wheeled) Transfers: Sit to/from Stand Sit to Stand: Min assist         General transfer comment: Min A for steadying upon standing. Verbal cues for appropraite UE placement. Pt noted to quickly stand with both hands on rw. practiced safe hand placement during sit<>stand.    Balance Overall balance assessment: Needs assistance         Standing balance support: Bilateral upper extremity supported;During functional activity Standing balance-Leahy Scale: Poor Standing balance comment:  Reliant on RW for steadying                            ADL either performed or assessed with clinical judgement   ADL Overall ADL's : Needs assistance/impaired     Grooming: Min guard;Standing;Oral care;Wash/dry hands                               Functional mobility during ADLs: Minimal assistance;Rolling walker General ADL Comments: Pt completed bed mobility, in-room functional mobility, and grooming task at sink as detailed above. Daughter present and included in education. Cues for slowing down pace.      Vision       Perception     Praxis      Cognition Arousal/Alertness: Awake/alert Behavior During Therapy: WFL for tasks assessed/performed Overall Cognitive Status: Within Functional Limits for tasks assessed                                 General Comments: Pt frequently talking off topic but able to problem solve for basic ADL well. Difficulty with higher level cognitive skills noted.        Exercises     Shoulder Instructions       General Comments      Pertinent Vitals/ Pain       Pain Assessment: Faces Faces Pain Scale: Hurts little more Pain Location: back  Pain Descriptors / Indicators: Aching;Operative site guarding;Sore Pain Intervention(s): Limited activity within patient's tolerance;Monitored during session;Repositioned  Home Living  Prior Functioning/Environment              Frequency  Min 2X/week        Progress Toward Goals  OT Goals(current goals can now be found in the care plan section)  Progress towards OT goals: Progressing toward goals  Acute Rehab OT Goals Patient Stated Goal: to go home OT Goal Formulation: With patient Time For Goal Achievement: 08/16/16 Potential to Achieve Goals: Good ADL Goals Pt Will Perform Grooming: with modified independence;standing Pt Will Perform Lower Body Bathing: sit to/from stand;with  adaptive equipment;with modified independence Pt Will Perform Upper Body Dressing: with supervision;sitting Pt Will Perform Lower Body Dressing: with modified independence;with adaptive equipment;sit to/from stand Pt Will Transfer to Toilet: with modified independence;ambulating;bedside commode Pt Will Perform Toileting - Clothing Manipulation and hygiene: with modified independence;sit to/from stand;with adaptive equipment Pt Will Perform Tub/Shower Transfer: Tub transfer;shower seat;rolling walker;tub bench;with supervision Additional ADL Goal #1: Pt will complete bed mobility adhering to back precautions with modified independence in preparation for ADL tasks seated at EOB.  Plan Discharge plan remains appropriate    Co-evaluation                 End of Session Equipment Utilized During Treatment: Rolling walker;Back brace  OT Visit Diagnosis: Unsteadiness on feet (R26.81);Other abnormalities of gait and mobility (R26.89);Pain Pain - Right/Left: Right Pain - part of body: Leg   Activity Tolerance Patient tolerated treatment well   Patient Left in bed;with call bell/phone within reach;with bed alarm set;with family/visitor present   Nurse Communication Mobility status        Time: 1255-1311 OT Time Calculation (min): 16 min  Charges: OT General Charges $OT Visit: 1 Procedure OT Treatments $Self Care/Home Management : 8-22 mins     Clover Mealy OTR/L Pager: [phone]  08/03/2016, 1:23 PM

## 2016-08-04 MED ORDER — APIXABAN 5 MG PO TABS
5.0000 mg | ORAL_TABLET | Freq: Two times a day (BID) | ORAL | Status: DC
  Administered 2016-08-04 – 2016-08-06 (×4): 5 mg via ORAL
  Filled 2016-08-04 (×5): qty 1

## 2016-08-04 MED ORDER — OXYCODONE HCL ER 10 MG PO T12A: 10.0000 mg | EXTENDED_RELEASE_TABLET | Freq: Two times a day (BID) | ORAL | 0 refills | Status: DC

## 2016-08-04 NOTE — NC FL2 (Signed)
[place] LEVEL OF CARE SCREENING TOOL     IDENTIFICATION  Patient Name: [name] Birthdate: 1933-03-14 Sex: female Admission Date (Current Location): 08/01/2016  [facility] and [place] Number:  Herbalist and Address:  [facility], [place] [address] [place], [place] [address]      Provider Number: [id number]  Attending Physician Name and Address:  Kevan Ny Ditty, MD  Relative Name and Phone Number:       Current Level of Care: Hospital Recommended Level of Care: [place] Prior Approval Number:    Date Approved/Denied:   PASRR Number: [id number]  Discharge Plan: SNF    Current Diagnoses: Patient Active Problem List   Diagnosis Date Noted  . Lumbosacral spondylosis with radiculopathy 08/01/2016  . Preoperative clearance 07/05/2016  . Cough 05/26/2016  . Weakness 04/26/2016  . Nausea without vomiting 04/26/2016  . Atrial fibrillation with RVR ([place]) 04/15/2016  . SOB (shortness of breath) 03/20/2016  . Hypoxia   . Acute on chronic diastolic congestive heart failure ([place])   . Atrial fibrillation ([place])   . Chronic kidney disease, stage III (moderate) 02/17/2016  . Essential hypertension 02/17/2016  . COPD exacerbation ([place]) 02/17/2016  . Acute bronchitis 12/18/2015  . CAD in native artery   . Chest pain at rest 10/15/2015  . Chronic low back pain 10/15/2015  . Hyponatremia 10/15/2015  . Hyperglycemia 10/15/2015  . Thrombocytosis ([place]) 10/15/2015  . Atypical chest pain 10/15/2015  . Depression   . Anxiety   . Gastroesophageal reflux disease without esophagitis   . Mild cognitive impairment 10/14/2015  . Iron deficiency anemia due to chronic blood loss   . Meningitis 10/01/2015  . Coronary artery disease due to lipid rich plaque   . NSVT (nonsustained ventricular tachycardia) ([place])   . PAF (paroxysmal atrial fibrillation) ([place])   . CSF leak 09/27/2015  . Spondylolisthesis of lumbar region  09/24/2015  . Gastric polyp   . History of colonic polyps   . Chronic diarrhea   . Diarrhea 04/01/2015  . Esophageal dysphagia 04/01/2015  . PAOD (peripheral arterial occlusive disease) ([place]) 03/05/2015  . Pain in the chest   . Hyperlipidemia   . Weight gain 08/12/2014  . Normocytic anemia 08/07/2014  . Hemorrhoids 08/06/2014  . Diastolic CHF, acute on chronic (HCC) 07/30/2014  . CHF (congestive heart failure) ([place]) 07/29/2014  . Rectal bleeding 07/06/2014  . Constipation 07/06/2014  . Lower extremity edema 11/24/2013  . Acute kidney failure ([place]) 11/24/2013  . Hypokalemia 11/24/2013  . Other and unspecified angina pectoris 05/14/2013  . Hematochezia 02/13/2011  . Abdominal pain 02/13/2011  . Major depression ([place]) 09/28/2010  . FATIGUE 04/13/2009  . Chronic diastolic heart failure ([place]) 11/30/2008  . DYSPNEA 11/30/2008  . HYPERLIPIDEMIA-MIXED 11/27/2008  . CAD, NATIVE VESSEL - PCI + DES to left circumflex 05/14/13 11/27/2008  . Peripheral vascular disease ([place]) 11/27/2008  . PANIC ATTACK 02/28/2008  . MI 02/28/2008  . Internal hemorrhoids 02/28/2008  . GASTROESOPHAGEAL REFLUX DISEASE, CHRONIC 02/28/2008  . COLITIS 02/28/2008  . Dysphagia 02/28/2008    Orientation RESPIRATION BLADDER Height & Weight     Self, Time, Situation, Place  O2 (2L) Continent Weight: 147 lb (66.7 kg) Height:  5\' 1"  (154.9 cm)  BEHAVIORAL SYMPTOMS/MOOD NEUROLOGICAL BOWEL NUTRITION STATUS      Continent Diet (Regular diet; thin fluids)  AMBULATORY STATUS COMMUNICATION OF NEEDS Skin   Limited Assist Verbally Normal  Personal Care Assistance Level of Assistance  Bathing, Feeding, Dressing Bathing Assistance: Limited assistance Feeding assistance: Independent Dressing Assistance: Limited assistance     Functional Limitations Info  Sight, Hearing, Speech Sight Info: Adequate Hearing Info: Adequate Speech Info: Adequate    SPECIAL CARE FACTORS FREQUENCY  PT (By  licensed PT), OT (By licensed OT)     PT Frequency: 5x OT Frequency: 2x            Contractures Contractures Info: Not present    Additional Factors Info  Code Status, Allergies Code Status Info: Full Allergies Info: Cephalosporins, Levaquin Levofloxacin In D5w, Macrodantin Nitrofurantoin Macrocrystal, Phenothiazines, Polysorbate, Prednisone, Buspirone, Cardura Doxazosin Mesylate, Codeine, Acyclovir And Related, Prochlorperazine, Ranexa Ranolazine, Atorvastatin, Ofloxacin, Other, Penicillins, Pimozide           Current Medications (08/04/2016):  This is the current hospital active medication list Current Facility-Administered Medications  Medication Dose Route Frequency Provider Last Rate Last Dose  . 0.9 %  sodium chloride infusion   Intravenous Continuous Tamala Fothergill, MD   Stopped at 08/03/16 0730  . 0.9 %  sodium chloride infusion  250 mL Intravenous Continuous Kevan Ny Ditty, MD      . acetaminophen (TYLENOL) tablet 1,000 mg  1,000 mg Oral Q6H Kevan Ny Ditty, MD   1,000 mg at 08/04/16 1122  . ALPRAZolam Duanne Moron) tablet 0.25 mg  0.25 mg Oral BID PRN Kevan Ny Ditty, MD      . apixaban Arne Cleveland) tablet 5 mg  5 mg Oral BID Kevan Ny Ditty, MD      . bisacodyl (DULCOLAX) EC tablet 5 mg  5 mg Oral Daily PRN Kevan Ny Ditty, MD      . docusate sodium (COLACE) capsule 100 mg  100 mg Oral BID Kevan Ny Ditty, MD   100 mg at 08/04/16 1121  . DULoxetine (CYMBALTA) DR capsule 60 mg  60 mg Oral QHS Kevan Ny Ditty, MD   60 mg at 08/03/16 2205  . fluticasone (FLONASE) 50 MCG/ACT nasal spray 2 spray  2 spray Each Nare Daily Kevan Ny Ditty, MD      . furosemide (LASIX) tablet 40 mg  40 mg Oral Daily Kevan Ny Ditty, MD   40 mg at 08/04/16 1120  . gabapentin (NEURONTIN) capsule 300 mg  300 mg Oral TID Kevan Ny Ditty, MD   300 mg at 08/04/16 1120  . hydrocortisone (ANUSOL-HC) suppository 25 mg  25 mg Rectal BID PRN Kevan Ny  Ditty, MD      . ipratropium-albuterol (DUONEB) 0.5-2.5 (3) MG/3ML nebulizer solution 3 mL  3 mL Nebulization Q6H PRN Kevan Ny Ditty, MD      . lactated ringers infusion   Intravenous Continuous Roderic Palau, MD 50 mL/hr at 08/01/16 0920    . magnesium oxide (MAG-OX) tablet 400 mg  400 mg Oral Daily Kevan Ny Ditty, MD   400 mg at 08/04/16 1123  . menthol-cetylpyridinium (CEPACOL) lozenge 3 mg  1 lozenge Oral PRN Kevan Ny Ditty, MD       Or  . phenol (CHLORASEPTIC) mouth spray 1 spray  1 spray Mouth/Throat PRN Kevan Ny Ditty, MD      . methocarbamol (ROBAXIN) tablet 750 mg  750 mg Oral QID Kevan Ny Ditty, MD   750 mg at 08/03/16 1721  . metoprolol succinate (TOPROL-XL) 24 hr tablet 75 mg  75 mg Oral Daily Kevan Ny Ditty, MD   75 mg at 08/03/16 0929  . multivitamin with minerals tablet 1 tablet  1 tablet Oral Q lunch Kevan Ny Ditty, MD   1 tablet at 08/04/16 1124  . nitroGLYCERIN (NITROSTAT) SL tablet 0.4 mg  0.4 mg Sublingual Q5 min PRN Kevan Ny Ditty, MD      . ondansetron [facility]) tablet 4 mg  4 mg Oral Q6H PRN Kevan Ny Ditty, MD       Or  . ondansetron [facility]) injection 4 mg  4 mg Intravenous Q6H PRN Kevan Ny Ditty, MD   4 mg at 08/01/16 1626  . oxyCODONE (Oxy IR/ROXICODONE) immediate release tablet 5-10 mg  5-10 mg Oral Q3H PRN Kevan Ny Ditty, MD   10 mg at 08/04/16 0510  . oxyCODONE (OXYCONTIN) 12 hr tablet 10 mg  10 mg Oral Q12H Kevan Ny Ditty, MD   10 mg at 08/04/16 1121  . pantoprazole (PROTONIX) EC tablet 40 mg  40 mg Oral Daily Kevan Ny Ditty, MD   40 mg at 08/04/16 1122  . pantoprazole (PROTONIX) injection 40 mg  40 mg Intravenous QHS Kevan Ny Ditty, MD   40 mg at 08/03/16 2207  . senna (SENOKOT) tablet 8.6 mg  1 tablet Oral BID Kevan Ny Ditty, MD   8.6 mg at 08/04/16 1121  . sodium chloride flush (NS) 0.9 % injection 3 mL  3 mL Intravenous Q12H Kevan Ny Ditty, MD   3 mL at 08/04/16  1124  . sodium chloride flush (NS) 0.9 % injection 3 mL  3 mL Intravenous PRN Kevan Ny Ditty, MD      . sodium phosphate (FLEET) 7-19 GM/118ML enema 1 enema  1 enema Rectal Once PRN Kevan Ny Ditty, MD      . zolpidem (AMBIEN) tablet 5 mg  5 mg Oral QHS PRN Tamala Fothergill, MD         Discharge Medications: Please see discharge summary for a list of discharge medications.  Relevant Imaging Results:  Relevant Lab Results:   Additional Information SSN: [id number]  Truitt Merle, LCSW

## 2016-08-04 NOTE — Progress Notes (Signed)
ANTICOAGULATION CONSULT NOTE - Initial Consult  Pharmacy Consult :  Restart Apixaban  Indication: h/o nonvalvular atrial fibrillation  Allergies  Allergen Reactions  . Cephalosporins Diarrhea and Nausea Only    Lightheaded  . Levaquin [Levofloxacin In D5w] Swelling  . Macrodantin [Nitrofurantoin Macrocrystal] Swelling  . Phenothiazines Anaphylaxis and Hives  . Polysorbate Anaphylaxis  . Prednisone Shortness Of Breath  . Buspirone Itching  . Cardura [Doxazosin Mesylate] Itching  . Codeine Itching  . Acyclovir And Related Itching    Redness of skin  . Prochlorperazine Other (See Comments)    "Upset stomach"  . Ranexa [Ranolazine]     Severe drop in BP  . Atorvastatin Hives    Cramping; tolerates Crestor ok  . Ofloxacin Rash  . Other Itching and Rash    "WOOL"= make skin look like it has been burned  . Penicillins Other (See Comments)    Causes redness all over. Has patient had a PCN reaction causing immediate rash, facial/tongue/throat swelling, SOB or lightheadedness with hypotension: No Has patient had a PCN reaction causing severe rash involving mucus membranes or skin necrosis: No Has patient had a PCN reaction that required hospitalization No Has patient had a PCN reaction occurring within the last 10 years: No If all of the above answers are "NO", then may proceed with Cephalosporin use.   . Pimozide Hives and Itching    Patient Measurements: Height: 5\' 1"  (154.9 cm) Weight: 147 lb (66.7 kg) IBW/kg (Calculated) : 47.8   Vital Signs: Temp: 98.4 F (36.9 C) (04/20 1421) Temp Source: Oral (04/20 1421) BP: 125/52 (04/20 1421) Pulse Rate: 87 (04/20 1421)  Labs:  Recent Labs  08/02/16 0504 08/03/16 1237  HGB 8.8* 8.6*  HCT 29.0* 27.2*  PLT 248 240  CREATININE 1.15*  --     Estimated Creatinine Clearance: 33.6 mL/min (A) (by C-G formula based on SCr of 1.15 mg/dL (H)).   Medical History: Past Medical History:  Diagnosis Date  . Anxiety   . Arthritis    . Atrial fibrillation ([place])   . Bursitis    left shoulder  . Cataract   . CHF (congestive heart failure) (HCC)    EF 55-60%  . Complication of anesthesia   . COPD (chronic obstructive pulmonary disease) ([place])   . Coronary atherosclerosis of native coronary artery    a. DES to CX, moderately severe stenosis RCA, mild stenosis LAD 04/2013  . Depression   . Dysphagia, unspecified(787.20)   . GERD (gastroesophageal reflux disease)    Hx Schatzki's ring, multiple EGD/ED last 01/06/2004  . Headache   . Heart disease   . Heart murmur    'a littel'  . History of anemia   . HTN (hypertension)    Hx of it  . Hyperlipemia   . Hyperlipidemia   . Internal hemorrhoids without mention of complication   . MI (myocardial infarction) ([place]) 2006  . Microscopic colitis 2003  . Other and unspecified hyperlipidemia   . Panic disorder without agoraphobia   . Paresthesia    hands, feet  . Pneumonia 12/2011  . PONV (postoperative nausea and vomiting)    'a little nausea"  . PVD (peripheral vascular disease) ([place])   . S/P colonoscopy 09/27/2001   internal hemorrhoids, desc colon inflam polyp, SB BX-chronic duodenitis, colitis  . Shortness of breath   . Sleep disorder    obstructive  . Thyroid disease    recent abnl TSH per pt    Medications:  Prescriptions Prior to  Admission  Medication Sig Dispense Refill Last Dose  . ALPRAZolam (XANAX) 0.25 MG tablet Take 0.25 mg by mouth 2 (two) times daily as needed (for anxiety/panic attacks).    Past Month at Unknown time  . apixaban (ELIQUIS) 5 MG TABS tablet Take 1 tablet (5 mg total) by mouth 2 (two) times daily. 60 tablet 6 07/28/2016  . DULoxetine (CYMBALTA) 60 MG capsule Take 60 mg by mouth at bedtime.    07/31/2016 at Unknown time  . fluticasone (FLONASE) 50 MCG/ACT nasal spray Place 2 sprays into both nostrils daily.    Past Week at Unknown time  . furosemide (LASIX) 40 MG tablet TAKE 40 MG TWICE A DAY FOR 3 DAYS, THEN TAKE 40 MG DAILY FROM THEN ON  (Patient taking differently: Take 40 mg by mouth daily. ) 90 tablet 3 07/31/2016 at Unknown time  . hydrocortisone (ANUSOL-HC) 2.5 % rectal cream Place 1 application rectally 2 (two) times daily. (Patient taking differently: Place 1 application rectally 2 (two) times daily as needed (for irritated rectum/itching). ) 30 g 1 Past Month at Unknown time  . ipratropium-albuterol (DUONEB) 0.5-2.5 (3) MG/3ML SOLN Take 3 mLs by nebulization every 6 (six) hours. (Patient taking differently: Take 3 mLs by nebulization every 6 (six) hours as needed (for wheezin/shortness of breath). ) 360 mL 0 Past Week at Unknown time  . Magnesium 400 MG TABS Take 400 mg by mouth daily. 90 tablet 3 Past Week at Unknown time  . metoprolol succinate (TOPROL-XL) 50 MG 24 hr tablet Take 1 1/2 tablets (75 mg ) daily (Patient taking differently: Take 75 mg by mouth daily. Take 1 1/2 tablets (75 mg ) daily) 135 tablet 3 08/01/2016 at 0700  . Multiple Vitamin (MULTIVITAMIN WITH MINERALS) TABS tablet Take 1 tablet by mouth daily with lunch. Centrum   Past Week at Unknown time  . pantoprazole (PROTONIX) 40 MG tablet Take 40 mg by mouth daily.    07/31/2016 at Unknown time  . acetaminophen (TYLENOL) 500 MG tablet Take 500 mg by mouth daily as needed for headache.    More than a month at Unknown time  . bisacodyl (DULCOLAX) 5 MG EC tablet Take 1 tablet (5 mg total) by mouth daily as needed for moderate constipation. (Patient not taking: Reported on 07/19/2016) 30 tablet 0 Not Taking at Unknown time  . gabapentin (NEURONTIN) 300 MG capsule Take 1 capsule (300 mg total) by mouth 3 (three) times daily. (Patient not taking: Reported on 07/19/2016) 90 capsule 2 Not Taking at Unknown time  . hydrocortisone (ANUSOL-HC) 25 MG suppository Place 1 suppository (25 mg total) rectally every 12 (twelve) hours. (Patient taking differently: Place 25 mg rectally 2 (two) times daily as needed (for irritated rectum/itching). ) 12 suppository 1 More than a month at  Unknown time  . nitroGLYCERIN (NITROSTAT) 0.4 MG SL tablet Place 0.4 mg under the tongue every 5 (five) minutes as needed for chest pain. Reported on 08/04/2015   More than a month at Unknown time    Assessment: 83 y.o female on apixaban pta for h/o nonvalvular atrial fibrillation.  PTA dose: apixaban 5 mg BID, last taken on 4/13 pta, held prior to her surgery. Now POD# s/p L2-L5 spinal fusion surgery.  Apixaban had not been resumed yet. I spoke to Dr. Ellene Route on the phone about this and he gave order to restart apixaban today.   Hg 8.8>8.6>no lab 4/20, pltc 248>240k SCr 1.15 on 08/02/16. RN reports no bleeding noted and patient is urinating  normal amount.  Age >80, wt >60kg, Scr <1.5, only meets 1 of 3 criteria that would require to decreased apixaban dose, thus 5mg  BID is appropriate.  Plan:  Resume apixaban 5 mg po BID.  Monitor for s/sx of bleeding and decrease in renal function.   Nicole Cella, RPh Clinical Pharmacist Pager: [phone] [phone]-10P [phone] [place] [phone] 08/04/2016,3:38 PM

## 2016-08-04 NOTE — Discharge Summary (Signed)
Physician Discharge Summary  Patient ID: [name] MRN: [id number] DOB/AGE: 1933-01-24 83 y.o.  Admit date: 08/01/2016 Discharge date: 08/05/2016  Admission Diagnoses: Pseudoarthrosis L3-4 and L4-5, spondylosis with retrolisthesis L2-3, lumbar radiculopathy history of previous fusion L3-4 and L4-5  Discharge Diagnoses: Pseudoarthrosis L3-4 and L4-5, spondylosis with retrolisthesis L2-3, lumbar radiculopathy history of previous fusion L3-4 L4-5. Active Problems:   Lumbosacral spondylosis with radiculopathy   Discharged Condition: fair  Hospital Course: Patient was admitted to undergo a lateral decompression and fusion of L2-3 with interbody grafting at L3-4 and L4-5 having had previous decompression and fusion L3-L5.  Consults: None  Significant Diagnostic Studies: None  Treatments: Decompression L2-3 with anterolateral fixation with plate and screws arthrodesis with allograft and lateral interbody fusion. Lateral interbody grafting infusion with allograft L3-4 and L4-5  Discharge Exam: Blood pressure (!) 125/52, pulse 87, temperature 98.4 F (36.9 C), temperature source Oral, resp. rate 16, height 5\' 1"  (1.549 m), weight 66.7 kg (147 lb), SpO2 100 %. Incision is clean and dry. Station and gait is intact.  Disposition: 01-Home or Self Care  Discharge Instructions    Call MD for:  redness, tenderness, or signs of infection (pain, swelling, redness, odor or green/yellow discharge around incision site)    Complete by:  As directed    Call MD for:  severe uncontrolled pain    Complete by:  As directed    Call MD for:  temperature >100.4    Complete by:  As directed    Diet - low sodium heart healthy    Complete by:  As directed    Increase activity slowly    Complete by:  As directed      Allergies as of 08/04/2016      Reactions   Cephalosporins Diarrhea, Nausea Only   Lightheaded   Levaquin [levofloxacin In D5w] Swelling   Macrodantin [nitrofurantoin Macrocrystal]  Swelling   Phenothiazines Anaphylaxis, Hives   Polysorbate Anaphylaxis   Prednisone Shortness Of Breath   Buspirone Itching   Cardura [doxazosin Mesylate] Itching   Codeine Itching   Acyclovir And Related Itching   Redness of skin   Prochlorperazine Other (See Comments)   "Upset stomach"   Ranexa [ranolazine]    Severe drop in BP   Atorvastatin Hives   Cramping; tolerates Crestor ok   Ofloxacin Rash   Other Itching, Rash   "WOOL"= make skin look like it has been burned   Penicillins Other (See Comments)   Causes redness all over. Has patient had a PCN reaction causing immediate rash, facial/tongue/throat swelling, SOB or lightheadedness with hypotension: No Has patient had a PCN reaction causing severe rash involving mucus membranes or skin necrosis: No Has patient had a PCN reaction that required hospitalization No Has patient had a PCN reaction occurring within the last 10 years: No If all of the above answers are "NO", then may proceed with Cephalosporin use.   Pimozide Hives, Itching      Medication List    TAKE these medications   acetaminophen 500 MG tablet Commonly known as:  TYLENOL Take 500 mg by mouth daily as needed for headache.   ALPRAZolam 0.25 MG tablet Commonly known as:  XANAX Take 0.25 mg by mouth 2 (two) times daily as needed (for anxiety/panic attacks).   apixaban 5 MG Tabs tablet Commonly known as:  ELIQUIS Take 1 tablet (5 mg total) by mouth 2 (two) times daily.   bisacodyl 5 MG EC tablet Commonly known as:  DULCOLAX Take 1  tablet (5 mg total) by mouth daily as needed for moderate constipation.   DULoxetine 60 MG capsule Commonly known as:  CYMBALTA Take 60 mg by mouth at bedtime.   fluticasone 50 MCG/ACT nasal spray Commonly known as:  FLONASE Place 2 sprays into both nostrils daily.   furosemide 40 MG tablet Commonly known as:  LASIX TAKE 40 MG TWICE A DAY FOR 3 DAYS, THEN TAKE 40 MG DAILY FROM THEN ON What changed:  how much to  take  how to take this  when to take this  additional instructions   gabapentin 300 MG capsule Commonly known as:  NEURONTIN Take 1 capsule (300 mg total) by mouth 3 (three) times daily.   hydrocortisone 2.5 % rectal cream Commonly known as:  ANUSOL-HC Place 1 application rectally 2 (two) times daily. What changed:  when to take this  reasons to take this   hydrocortisone 25 MG suppository Commonly known as:  ANUSOL-HC Place 1 suppository (25 mg total) rectally every 12 (twelve) hours. What changed:  when to take this  reasons to take this   ipratropium-albuterol 0.5-2.5 (3) MG/3ML Soln Commonly known as:  DUONEB Take 3 mLs by nebulization every 6 (six) hours. What changed:  when to take this  reasons to take this   Magnesium 400 MG Tabs Take 400 mg by mouth daily.   metoprolol succinate 50 MG 24 hr tablet Commonly known as:  TOPROL-XL Take 1 1/2 tablets (75 mg ) daily What changed:  how much to take  how to take this  when to take this  additional instructions   multivitamin with minerals Tabs tablet Take 1 tablet by mouth daily with lunch. Centrum   nitroGLYCERIN 0.4 MG SL tablet Commonly known as:  NITROSTAT Place 0.4 mg under the tongue every 5 (five) minutes as needed for chest pain. Reported on 08/04/2015   oxyCODONE 10 mg 12 hr tablet Commonly known as:  OXYCONTIN Take 1 tablet (10 mg total) by mouth every 12 (twelve) hours.   pantoprazole 40 MG tablet Commonly known as:  PROTONIX Take 40 mg by mouth daily.      Contact information for after-discharge care    [facility] .   Specialty:  Skilled Nursing Facility Contact information: [address] [place] [phone]              Signed: Earleen [place] 08/04/2016, 6:03 PM

## 2016-08-04 NOTE — Progress Notes (Signed)
Physical Therapy Treatment Patient Details Name: [name] MRN: [id number] DOB: [birth year] Today's Date: 08/04/2016    History of Present Illness Pt is an 83 y.o. female s/p L2-L5 transpsoas lateral interbody fusion with L2-3 lateral plate fixation. She has a PMH significant for atrial fibrillation, CHF, COPD, GERD, dysphagia, heart murmur, hyperlipidemia, MI, paresthesia, pneumonia, and PVD.     PT Comments    Emphasis on education, all basic mobility, gait with SpO2 values.  Stability improving in general with less tremor.  Sats seem to be dipping into the 87/88% range with quick return to 90's   Follow Up Recommendations  SNF;Supervision/Assistance - 24 hour     Equipment Recommendations  Rolling walker with 5" wheels    Recommendations for Other Services       Precautions / Restrictions Precautions Precautions: Back Precaution Comments: reviewed precautions Required Braces or Orthoses: Spinal Brace Spinal Brace: Thoracolumbosacral orthotic;Applied in sitting position Restrictions Weight Bearing Restrictions: No    Mobility  Bed Mobility Overal bed mobility: Needs Assistance Bed Mobility: Rolling;Sidelying to Sit;Sit to Sidelying Rolling: Supervision Sidelying to sit: Supervision     Sit to sidelying: Supervision General bed mobility comments: use of the rail, but no assist needed  Transfers Overall transfer level: Needs assistance Equipment used: Rolling walker (2 wheeled) Transfers: Sit to/from Stand Sit to Stand: Min guard            Ambulation/Gait Ambulation/Gait assistance: Min assist Ambulation Distance (Feet): 100 Feet (130 then 40 feet with standing rest between) Assistive device: Rolling walker (2 wheeled) Gait Pattern/deviations: Step-through pattern Gait velocity: Decreased Gait velocity interpretation: Below normal speed for age/gender General Gait Details: gait much smoother today with no buckling or overt tremor.  sats on RA initially  90% and HR in the 60's /70's;  During gait on RA sats dip to 87/88% with rise to the mid 90's as she stands to rest.  EHR in the 50's to 70's, variable enough not to be certain.   Stairs            Wheelchair Mobility    Modified Rankin (Stroke Patients Only)       Balance Overall balance assessment: Needs assistance Sitting-balance support: No upper extremity supported;Feet supported Sitting balance-Leahy Scale: Good       Standing balance-Leahy Scale: Fair Standing balance comment: safer with RW                            Cognition Arousal/Alertness: Awake/alert Behavior During Therapy: WFL for tasks assessed/performed Overall Cognitive Status: Within Functional Limits for tasks assessed                                 General Comments: pt more focused and appropriate today      Exercises      General Comments General comments (skin integrity, edema, etc.): Reinforced all education       Pertinent Vitals/Pain Pain Assessment: Faces Faces Pain Scale: Hurts a little bit Pain Location: back  Pain Descriptors / Indicators: Sore Pain Intervention(s): Monitored during session    Home Living                      Prior Function            PT Goals (current goals can now be found in the care plan section) Acute Rehab PT Goals  Patient Stated Goal: to go home PT Goal Formulation: With patient Time For Goal Achievement: 08/09/16 Potential to Achieve Goals: Good Progress towards PT goals: Progressing toward goals    Frequency    Min 5X/week      PT Plan Current plan remains appropriate    Co-evaluation             End of Session Equipment Utilized During Treatment: Gait belt;Oxygen;Back brace Activity Tolerance: Patient tolerated treatment well Patient left: in bed;with call bell/phone within reach;with bed alarm set;with family/visitor present Nurse Communication: Mobility status PT Visit Diagnosis: Other  abnormalities of gait and mobility (R26.89);Muscle weakness (generalized) (M62.81)     Time: 7505-1833 PT Time Calculation (min) (ACUTE ONLY): 38 min  Charges:  $Gait Training: 8-22 mins $Therapeutic Activity: 8-22 mins $Self Care/Home Management: 8-22                    G CodesTessie Fass Donneisha Beane 08/04/2016, 11:26 AM 08/04/2016  Donnella Sham, [place] [phone]  (pager)08/04/2016  Donnella Sham, [place] [phone]  (pager)

## 2016-08-04 NOTE — Clinical Social Work Note (Signed)
Clinical Social Work Assessment  Patient Details  Name: [name] MRN: [id number] Date of Birth: [birth year]  Date of referral:  08/04/16               Reason for consult:  Discharge Planning                Permission sought to share information with:  Family Supports, Customer service manager Permission granted to share information::  Yes, Verbal Permission Granted  Name::     [name]  Agency::  SNFs  Relationship::  Son  Contact Information:  [phone]  Housing/Transportation Living arrangements for the past 2 months:  [place] of Information:  Patient, Adult Children Patient Interpreter Needed:  None Criminal Activity/Legal Involvement Pertinent to Current Situation/Hospitalization:  No - Comment as needed Significant Relationships:  Adult Children, Other Family Members Lives with:  Adult Children, Relatives Do you feel safe going back to the place where you live?  Yes Need for family participation in patient care:  No (Coment)  Care giving concerns: No caregiving concerns identified.    Social Worker assessment / plan:  CSW met pt at bedside to address consult for new SNF. Son-[name] present also. CSW introduced herself and explained role of social work. Pt from home and lives with daughter, son, and grandson. Pt gave CSW persmission to contact son about discharge plans. P/T is recommending STR at SNF. Pt agreeable to SNF for STR. Pt will only go to [facility].   FL-2 completed and fax[facility] to [facility]. CSW spoke with Marianna Fuss who confirmed bed offer and contact info for weekend admission. Hoyle Barr at [phone] or [phone]) CSW spoke with Dr. Ellene Route and updated him on d/c status. CSW will continue to follow.  Employment status:  Retired Forensic scientist:  Medicare PT Recommendations:  [place] / Referral to community resources:  [place]  Patient/Family's Response to care:  Pt and  family appreciative of CSW support.   Patient/Family's Understanding of and Emotional Response to Diagnosis, Current Treatment, and Prognosis:  Pt understands she will benefit from STR prior to returning home and is accepting of her diagnosis, current treatment plan, and prognosis. Pt is jovial and currently in good spirits.   Emotional Assessment Appearance:  Appears stated age Attitude/Demeanor/Rapport:  Other (Appropriate) Affect (typically observed):  Pleasant, Accepting, Adaptable Orientation:  Oriented to Self, Oriented to Place, Oriented to  Time, Oriented to Situation Alcohol / Substance use:  Other Psych involvement (Current and /or in the community):  No (Comment)  Discharge Needs  Concerns to be addressed:  Care Coordination Readmission within the last 30 days:  No Current discharge risk:  Dependent with Mobility Barriers to Discharge:  Continued Medical Work up   [employer], LCSW 08/04/2016, 5:59 PM

## 2016-08-04 NOTE — NC FL2 (Signed)
[place] LEVEL OF CARE SCREENING TOOL     IDENTIFICATION  Patient Name: [name] Birthdate: 05-07-1932 Sex: female Admission Date (Current Location): 08/01/2016  [facility] and [place] Number:  Herbalist and Address:  [facility], [place] [address] [place], [place] [address]      Provider Number: [id number]  Attending Physician Name and Address:  Kevan Ny Ditty, MD  Relative Name and Phone Number:       Current Level of Care: Hospital Recommended Level of Care: [place] Prior Approval Number:    Date Approved/Denied:   PASRR Number: [id number]  Discharge Plan: SNF    Current Diagnoses: Patient Active Problem List   Diagnosis Date Noted  . Lumbosacral spondylosis with radiculopathy 08/01/2016  . Preoperative clearance 07/05/2016  . Cough 05/26/2016  . Weakness 04/26/2016  . Nausea without vomiting 04/26/2016  . Atrial fibrillation with RVR ([place]) 04/15/2016  . SOB (shortness of breath) 03/20/2016  . Hypoxia   . Acute on chronic diastolic congestive heart failure ([place])   . Atrial fibrillation ([place])   . Chronic kidney disease, stage III (moderate) 02/17/2016  . Essential hypertension 02/17/2016  . COPD exacerbation ([place]) 02/17/2016  . Acute bronchitis 12/18/2015  . CAD in native artery   . Chest pain at rest 10/15/2015  . Chronic low back pain 10/15/2015  . Hyponatremia 10/15/2015  . Hyperglycemia 10/15/2015  . Thrombocytosis ([place]) 10/15/2015  . Atypical chest pain 10/15/2015  . Depression   . Anxiety   . Gastroesophageal reflux disease without esophagitis   . Mild cognitive impairment 10/14/2015  . Iron deficiency anemia due to chronic blood loss   . Meningitis 10/01/2015  . Coronary artery disease due to lipid rich plaque   . NSVT (nonsustained ventricular tachycardia) ([place])   . PAF (paroxysmal atrial fibrillation) ([place])   . CSF leak 09/27/2015  . Spondylolisthesis of lumbar region  09/24/2015  . Gastric polyp   . History of colonic polyps   . Chronic diarrhea   . Diarrhea 04/01/2015  . Esophageal dysphagia 04/01/2015  . PAOD (peripheral arterial occlusive disease) ([place]) 03/05/2015  . Pain in the chest   . Hyperlipidemia   . Weight gain 08/12/2014  . Normocytic anemia 08/07/2014  . Hemorrhoids 08/06/2014  . Diastolic CHF, acute on chronic (HCC) 07/30/2014  . CHF (congestive heart failure) ([place]) 07/29/2014  . Rectal bleeding 07/06/2014  . Constipation 07/06/2014  . Lower extremity edema 11/24/2013  . Acute kidney failure ([place]) 11/24/2013  . Hypokalemia 11/24/2013  . Other and unspecified angina pectoris 05/14/2013  . Hematochezia 02/13/2011  . Abdominal pain 02/13/2011  . Major depression ([place]) 09/28/2010  . FATIGUE 04/13/2009  . Chronic diastolic heart failure ([place]) 11/30/2008  . DYSPNEA 11/30/2008  . HYPERLIPIDEMIA-MIXED 11/27/2008  . CAD, NATIVE VESSEL - PCI + DES to left circumflex 05/14/13 11/27/2008  . Peripheral vascular disease ([place]) 11/27/2008  . PANIC ATTACK 02/28/2008  . MI 02/28/2008  . Internal hemorrhoids 02/28/2008  . GASTROESOPHAGEAL REFLUX DISEASE, CHRONIC 02/28/2008  . COLITIS 02/28/2008  . Dysphagia 02/28/2008    Orientation RESPIRATION BLADDER Height & Weight     Self, Time, Situation, Place  O2 (2L) Continent Weight: 147 lb (66.7 kg) Height:  5\' 1"  (154.9 cm)  BEHAVIORAL SYMPTOMS/MOOD NEUROLOGICAL BOWEL NUTRITION STATUS      Continent Diet (Regular diet; thin fluids)  AMBULATORY STATUS COMMUNICATION OF NEEDS Skin   Limited Assist Verbally Normal  Personal Care Assistance Level of Assistance  Bathing, Feeding, Dressing Bathing Assistance: Limited assistance Feeding assistance: Independent Dressing Assistance: Limited assistance     Functional Limitations Info  Sight, Hearing, Speech Sight Info: Adequate Hearing Info: Adequate Speech Info: Adequate    SPECIAL CARE FACTORS FREQUENCY  PT (By  licensed PT), OT (By licensed OT)     PT Frequency: 5x OT Frequency: 2x            Contractures Contractures Info: Not present    Additional Factors Info  Code Status, Allergies Code Status Info: Full Allergies Info: Cephalosporins, Levaquin Levofloxacin In D5w, Macrodantin Nitrofurantoin Macrocrystal, Phenothiazines, Polysorbate, Prednisone, Buspirone, Cardura Doxazosin Mesylate, Codeine, Acyclovir And Related, Prochlorperazine, Ranexa Ranolazine, Atorvastatin, Ofloxacin, Other, Penicillins, Pimozide           Current Medications (08/04/2016):  This is the current hospital active medication list Current Facility-Administered Medications  Medication Dose Route Frequency Provider Last Rate Last Dose  . 0.9 %  sodium chloride infusion   Intravenous Continuous Tamala Fothergill, MD   Stopped at 08/03/16 0730  . 0.9 %  sodium chloride infusion  250 mL Intravenous Continuous Kevan Ny Ditty, MD      . acetaminophen (TYLENOL) tablet 1,000 mg  1,000 mg Oral Q6H Kevan Ny Ditty, MD   1,000 mg at 08/04/16 1122  . ALPRAZolam Duanne Moron) tablet 0.25 mg  0.25 mg Oral BID PRN Kevan Ny Ditty, MD      . bisacodyl (DULCOLAX) EC tablet 5 mg  5 mg Oral Daily PRN Kevan Ny Ditty, MD      . docusate sodium (COLACE) capsule 100 mg  100 mg Oral BID Kevan Ny Ditty, MD   100 mg at 08/04/16 1121  . DULoxetine (CYMBALTA) DR capsule 60 mg  60 mg Oral QHS Kevan Ny Ditty, MD   60 mg at 08/03/16 2205  . fluticasone (FLONASE) 50 MCG/ACT nasal spray 2 spray  2 spray Each Nare Daily Kevan Ny Ditty, MD      . furosemide (LASIX) tablet 40 mg  40 mg Oral Daily Kevan Ny Ditty, MD   40 mg at 08/04/16 1120  . gabapentin (NEURONTIN) capsule 300 mg  300 mg Oral TID Kevan Ny Ditty, MD   300 mg at 08/04/16 1120  . hydrocortisone (ANUSOL-HC) suppository 25 mg  25 mg Rectal BID PRN Kevan Ny Ditty, MD      . ipratropium-albuterol (DUONEB) 0.5-2.5 (3) MG/3ML nebulizer  solution 3 mL  3 mL Nebulization Q6H PRN Kevan Ny Ditty, MD      . lactated ringers infusion   Intravenous Continuous Roderic Palau, MD 50 mL/hr at 08/01/16 0920    . magnesium oxide (MAG-OX) tablet 400 mg  400 mg Oral Daily Kevan Ny Ditty, MD   400 mg at 08/04/16 1123  . menthol-cetylpyridinium (CEPACOL) lozenge 3 mg  1 lozenge Oral PRN Kevan Ny Ditty, MD       Or  . phenol (CHLORASEPTIC) mouth spray 1 spray  1 spray Mouth/Throat PRN Kevan Ny Ditty, MD      . methocarbamol (ROBAXIN) tablet 750 mg  750 mg Oral QID Kevan Ny Ditty, MD   750 mg at 08/03/16 1721  . metoprolol succinate (TOPROL-XL) 24 hr tablet 75 mg  75 mg Oral Daily Kevan Ny Ditty, MD   75 mg at 08/03/16 0929  . multivitamin with minerals tablet 1 tablet  1 tablet Oral Q lunch Kevan Ny Ditty, MD   1 tablet at 08/04/16 1124  . nitroGLYCERIN (  NITROSTAT) SL tablet 0.4 mg  0.4 mg Sublingual Q5 min PRN Kevan Ny Ditty, MD      . ondansetron [facility]) tablet 4 mg  4 mg Oral Q6H PRN Kevan Ny Ditty, MD       Or  . ondansetron [facility]) injection 4 mg  4 mg Intravenous Q6H PRN Kevan Ny Ditty, MD   4 mg at 08/01/16 1626  . oxyCODONE (Oxy IR/ROXICODONE) immediate release tablet 5-10 mg  5-10 mg Oral Q3H PRN Kevan Ny Ditty, MD   10 mg at 08/04/16 0510  . oxyCODONE (OXYCONTIN) 12 hr tablet 10 mg  10 mg Oral Q12H Kevan Ny Ditty, MD   10 mg at 08/04/16 1121  . pantoprazole (PROTONIX) EC tablet 40 mg  40 mg Oral Daily Kevan Ny Ditty, MD   40 mg at 08/04/16 1122  . pantoprazole (PROTONIX) injection 40 mg  40 mg Intravenous QHS Kevan Ny Ditty, MD   40 mg at 08/03/16 2207  . senna (SENOKOT) tablet 8.6 mg  1 tablet Oral BID Kevan Ny Ditty, MD   8.6 mg at 08/04/16 1121  . sodium chloride flush (NS) 0.9 % injection 3 mL  3 mL Intravenous Q12H Kevan Ny Ditty, MD   3 mL at 08/04/16 1124  . sodium chloride flush (NS) 0.9 % injection 3 mL  3 mL Intravenous PRN  Kevan Ny Ditty, MD      . sodium phosphate (FLEET) 7-19 GM/118ML enema 1 enema  1 enema Rectal Once PRN Kevan Ny Ditty, MD      . zolpidem (AMBIEN) tablet 5 mg  5 mg Oral QHS PRN Tamala Fothergill, MD         Discharge Medications: Please see discharge summary for a list of discharge medications.  Relevant Imaging Results:  Relevant Lab Results:   Additional Information SSN: [id number]  Truitt Merle, LCSW

## 2016-08-05 NOTE — Care Management Note (Signed)
Case Management Note  Patient Details  Name: [name] MRN: [id number] Date of Birth: [birth year]  Subjective/Objective:                    Action/Plan:  DC to SNF as facilitated by CSW.   Expected Discharge Date:  08/05/16               Expected Discharge Plan:  Skilled Nursing Facility  In-House Referral:  Clinical Social Work  Discharge planning Services  CM Consult  Post Acute Care Choice:    Choice offered to:     DME Arranged:    DME Agency:     HH Arranged:    [place] Agency:     Status of Service:  Completed, signed off  If discussed at [employer], dates discussed:    Additional Comments:  Carles Collet, RN 08/05/2016, 8:13 AM

## 2016-08-05 NOTE — Progress Notes (Signed)
Subjective: Patient reports doing well  Objective: Vital signs in last 24 hours: Temp:  [97.8 F (36.6 C)-98.7 F (37.1 C)] 97.8 F (36.6 C) (04/21 0454) Pulse Rate:  [69-121] 121 (04/21 0454) Resp:  [16-17] 16 (04/21 0454) BP: (118-128)/(49-69) 128/67 (04/21 0454) SpO2:  [93 %-100 %] 93 % (04/21 0454)  Intake/Output from previous day: No intake/output data recorded. Intake/Output this shift: No intake/output data recorded.  Physical Exam: Up in chair.  Pain controlled.  Lab Results:  Recent Labs  08/03/16 1237  WBC 8.9  HGB 8.6*  HCT 27.2*  PLT 240   BMET No results for input(s): NA, K, CL, CO2, GLUCOSE, BUN, CREATININE, CALCIUM in the last 72 hours.  Studies/Results: No results found.  Assessment/Plan: Ready to transfer to SNF.    LOS: 4 days    Peggyann Shoals, MD 08/05/2016, 7:19 AM

## 2016-08-06 ENCOUNTER — Inpatient Hospital Stay
Admission: RE | Admit: 2016-08-06 | Discharge: 2016-08-31 | Disposition: A | Discharge status: Home / Self Care | Payer: Medicare Other | Source: Ambulatory Visit | Attending: Internal Medicine | Admitting: Internal Medicine

## 2016-08-06 DIAGNOSIS — R488 Other symbolic dysfunctions: Secondary | ICD-10-CM | POA: Diagnosis not present

## 2016-08-06 DIAGNOSIS — M549 Dorsalgia, unspecified: Secondary | ICD-10-CM | POA: Diagnosis not present

## 2016-08-06 DIAGNOSIS — R262 Difficulty in walking, not elsewhere classified: Secondary | ICD-10-CM | POA: Diagnosis not present

## 2016-08-06 DIAGNOSIS — I209 Angina pectoris, unspecified: Secondary | ICD-10-CM | POA: Diagnosis not present

## 2016-08-06 DIAGNOSIS — R0602 Shortness of breath: Secondary | ICD-10-CM | POA: Diagnosis not present

## 2016-08-06 DIAGNOSIS — M6281 Muscle weakness (generalized): Secondary | ICD-10-CM | POA: Diagnosis not present

## 2016-08-06 DIAGNOSIS — Z96651 Presence of right artificial knee joint: Secondary | ICD-10-CM | POA: Diagnosis not present

## 2016-08-06 DIAGNOSIS — J441 Chronic obstructive pulmonary disease with (acute) exacerbation: Secondary | ICD-10-CM | POA: Diagnosis not present

## 2016-08-06 DIAGNOSIS — G030 Nonpyogenic meningitis: Secondary | ICD-10-CM | POA: Diagnosis not present

## 2016-08-06 DIAGNOSIS — I5033 Acute on chronic diastolic (congestive) heart failure: Secondary | ICD-10-CM | POA: Diagnosis not present

## 2016-08-06 DIAGNOSIS — G8929 Other chronic pain: Secondary | ICD-10-CM | POA: Diagnosis not present

## 2016-08-06 DIAGNOSIS — M533 Sacrococcygeal disorders, not elsewhere classified: Secondary | ICD-10-CM | POA: Diagnosis not present

## 2016-08-06 DIAGNOSIS — R0989 Other specified symptoms and signs involving the circulatory and respiratory systems: Secondary | ICD-10-CM | POA: Diagnosis not present

## 2016-08-06 DIAGNOSIS — M545 Low back pain: Secondary | ICD-10-CM | POA: Diagnosis not present

## 2016-08-06 DIAGNOSIS — R279 Unspecified lack of coordination: Secondary | ICD-10-CM | POA: Diagnosis not present

## 2016-08-06 DIAGNOSIS — I5042 Chronic combined systolic (congestive) and diastolic (congestive) heart failure: Secondary | ICD-10-CM | POA: Diagnosis not present

## 2016-08-06 DIAGNOSIS — E876 Hypokalemia: Secondary | ICD-10-CM | POA: Diagnosis not present

## 2016-08-06 DIAGNOSIS — M4316 Spondylolisthesis, lumbar region: Secondary | ICD-10-CM | POA: Diagnosis not present

## 2016-08-06 DIAGNOSIS — I739 Peripheral vascular disease, unspecified: Secondary | ICD-10-CM | POA: Diagnosis not present

## 2016-08-06 DIAGNOSIS — F411 Generalized anxiety disorder: Secondary | ICD-10-CM | POA: Diagnosis not present

## 2016-08-06 DIAGNOSIS — I25119 Atherosclerotic heart disease of native coronary artery with unspecified angina pectoris: Secondary | ICD-10-CM | POA: Diagnosis not present

## 2016-08-06 DIAGNOSIS — D500 Iron deficiency anemia secondary to blood loss (chronic): Secondary | ICD-10-CM | POA: Diagnosis not present

## 2016-08-06 DIAGNOSIS — I5032 Chronic diastolic (congestive) heart failure: Secondary | ICD-10-CM | POA: Diagnosis not present

## 2016-08-06 DIAGNOSIS — I2510 Atherosclerotic heart disease of native coronary artery without angina pectoris: Secondary | ICD-10-CM | POA: Diagnosis not present

## 2016-08-06 DIAGNOSIS — Z48811 Encounter for surgical aftercare following surgery on the nervous system: Secondary | ICD-10-CM | POA: Diagnosis not present

## 2016-08-06 DIAGNOSIS — K219 Gastro-esophageal reflux disease without esophagitis: Secondary | ICD-10-CM | POA: Diagnosis not present

## 2016-08-06 DIAGNOSIS — I4891 Unspecified atrial fibrillation: Secondary | ICD-10-CM | POA: Diagnosis not present

## 2016-08-06 DIAGNOSIS — F410 Panic disorder [episodic paroxysmal anxiety] without agoraphobia: Secondary | ICD-10-CM | POA: Diagnosis not present

## 2016-08-06 DIAGNOSIS — M4711 Other spondylosis with myelopathy, occipito-atlanto-axial region: Secondary | ICD-10-CM | POA: Diagnosis not present

## 2016-08-06 DIAGNOSIS — J449 Chronic obstructive pulmonary disease, unspecified: Secondary | ICD-10-CM | POA: Diagnosis not present

## 2016-08-06 DIAGNOSIS — I480 Paroxysmal atrial fibrillation: Secondary | ICD-10-CM | POA: Diagnosis not present

## 2016-08-06 DIAGNOSIS — E785 Hyperlipidemia, unspecified: Secondary | ICD-10-CM | POA: Diagnosis not present

## 2016-08-06 DIAGNOSIS — D649 Anemia, unspecified: Secondary | ICD-10-CM | POA: Diagnosis not present

## 2016-08-06 DIAGNOSIS — M5440 Lumbago with sciatica, unspecified side: Secondary | ICD-10-CM | POA: Diagnosis not present

## 2016-08-06 DIAGNOSIS — M4727 Other spondylosis with radiculopathy, lumbosacral region: Secondary | ICD-10-CM | POA: Diagnosis not present

## 2016-08-06 DIAGNOSIS — F329 Major depressive disorder, single episode, unspecified: Secondary | ICD-10-CM | POA: Diagnosis not present

## 2016-08-06 DIAGNOSIS — Z9889 Other specified postprocedural states: Secondary | ICD-10-CM | POA: Diagnosis not present

## 2016-08-06 DIAGNOSIS — M1990 Unspecified osteoarthritis, unspecified site: Secondary | ICD-10-CM | POA: Diagnosis not present

## 2016-08-06 DIAGNOSIS — R293 Abnormal posture: Secondary | ICD-10-CM | POA: Diagnosis not present

## 2016-08-06 MED ORDER — TRAMADOL HCL 50 MG PO TABS: 50.0000 mg | ORAL_TABLET | Freq: Four times a day (QID) | ORAL | 0 refills | Status: DC | PRN

## 2016-08-06 NOTE — Progress Notes (Signed)
Patient discharging on Ultram prn for pain; not oxycontin per MD.

## 2016-08-06 NOTE — Clinical Social Work Note (Signed)
Clinical Social Worker facilitated patient discharge including contacting patient family and facility to confirm patient discharge plans.  Clinical information faxed to facility and family agreeable with plan.  CSW arranged ambulance transport via [facility] to [facility].  RN to call report prior to discharge.  Clinical Social Worker will sign off for now as social work intervention is no longer needed. Please consult [place] again if new need arises.  Barbette Or, [place]

## 2016-08-06 NOTE — Clinical Social Work Placement (Signed)
   CLINICAL SOCIAL WORK PLACEMENT  NOTE  Date:  08/06/2016  Patient Details  Name: DEVITA NIES MRN: [id number] Date of Birth: 1932/06/02  Clinical Social Work is seeking post-discharge placement for this patient at the [place] level of care (*CSW will initial, date and re-position this form in  chart as items are completed):  Yes   Patient/family provided with [place] Work Department's list of facilities offering this level of care within the geographic area requested by the patient (or if unable, by the patient's family).  Yes   Patient/family informed of their freedom to choose among providers that offer the needed level of care, that participate in Medicare, Medicaid or managed care program needed by the patient, have an available bed and are willing to accept the patient.  Yes   Patient/family informed of [facility]'s ownership interest in [facility] and [facility], as well as of the fact that they are under no obligation to receive care at these facilities.  PASRR submitted to EDS on       PASRR number received on       Existing PASRR number confirmed on       FL2 transmitted to all facilities in geographic area requested by pt/family on       FL2 transmitted to all facilities within larger geographic area on       Patient informed that his/her managed care company has contracts with or will negotiate with certain facilities, including the following:        Yes   Patient/family informed of bed offers received.  Patient chooses bed at [facility]     Physician recommends and patient chooses bed at      Patient to be transferred to [facility] on 08/06/16.  Patient to be transferred to facility by Ambulance     Patient family notified on 08/06/16 of transfer.  Name of family member notified:  Patient daughter- Manuela Schwartz     PHYSICIAN       Additional Comment:    Barbette Or, [place]

## 2016-08-06 NOTE — Progress Notes (Signed)
Report called to [facility] RN; patient ready for discharge; discharge instructions given and reviewed with patient; [facility] present to transfer patient; patient discharged to [facility].

## 2016-08-07 ENCOUNTER — Other Ambulatory Visit: Payer: Self-pay

## 2016-08-07 ENCOUNTER — Encounter: Payer: Self-pay | Admitting: Internal Medicine

## 2016-08-07 ENCOUNTER — Non-Acute Institutional Stay (SKILLED_NURSING_FACILITY): Payer: Medicare Other | Admitting: Internal Medicine

## 2016-08-07 ENCOUNTER — Encounter (HOSPITAL_COMMUNITY)
Admission: RE | Admit: 2016-08-07 | Discharge: 2016-08-07 | Disposition: A | Discharge status: Home / Self Care | Payer: Medicare Other | Source: Skilled Nursing Facility | Attending: Internal Medicine | Admitting: Internal Medicine

## 2016-08-07 DIAGNOSIS — Z96651 Presence of right artificial knee joint: Secondary | ICD-10-CM | POA: Insufficient documentation

## 2016-08-07 DIAGNOSIS — I2510 Atherosclerotic heart disease of native coronary artery without angina pectoris: Secondary | ICD-10-CM | POA: Diagnosis not present

## 2016-08-07 DIAGNOSIS — D500 Iron deficiency anemia secondary to blood loss (chronic): Secondary | ICD-10-CM | POA: Insufficient documentation

## 2016-08-07 DIAGNOSIS — K219 Gastro-esophageal reflux disease without esophagitis: Secondary | ICD-10-CM | POA: Diagnosis not present

## 2016-08-07 DIAGNOSIS — D649 Anemia, unspecified: Secondary | ICD-10-CM | POA: Diagnosis not present

## 2016-08-07 DIAGNOSIS — I5033 Acute on chronic diastolic (congestive) heart failure: Secondary | ICD-10-CM

## 2016-08-07 DIAGNOSIS — I480 Paroxysmal atrial fibrillation: Secondary | ICD-10-CM | POA: Diagnosis not present

## 2016-08-07 DIAGNOSIS — Z9889 Other specified postprocedural states: Secondary | ICD-10-CM | POA: Insufficient documentation

## 2016-08-07 DIAGNOSIS — Z48811 Encounter for surgical aftercare following surgery on the nervous system: Secondary | ICD-10-CM | POA: Insufficient documentation

## 2016-08-07 LAB — BASIC METABOLIC PANEL
ANION GAP: 10 (ref 5–15)
BUN: 19 mg/dL (ref 6–20)
CALCIUM: 8.9 mg/dL (ref 8.9–10.3)
CO2: 34 mmol/L — AB (ref 22–32)
CREATININE: 1.04 mg/dL — AB (ref 0.44–1.00)
Chloride: 93 mmol/L — ABNORMAL LOW (ref 101–111)
GFR calc non Af Amer: 49 mL/min — ABNORMAL LOW (ref >60)
GFR, EST AFRICAN AMERICAN: 57 mL/min — AB (ref >60)
Glucose, Bld: 103 mg/dL — ABNORMAL HIGH (ref 65–99)
Potassium: 3.3 mmol/L — ABNORMAL LOW (ref 3.5–5.1)
Sodium: 137 mmol/L (ref 135–145)

## 2016-08-07 LAB — CBC WITH DIFFERENTIAL/PLATELET
BASOS ABS: 0.1 10*3/uL (ref 0.0–0.1)
Basophils Relative: 1 %
Eosinophils Absolute: 0.1 10*3/uL (ref 0.0–0.7)
Eosinophils Relative: 2 %
HEMATOCRIT: 28.7 % — AB (ref 36.0–46.0)
Hemoglobin: 9.2 g/dL — ABNORMAL LOW (ref 12.0–15.0)
LYMPHS PCT: 34 %
Lymphs Abs: 2.2 10*3/uL (ref 0.7–4.0)
MCH: 25.3 pg — ABNORMAL LOW (ref 26.0–34.0)
MCHC: 32.1 g/dL (ref 30.0–36.0)
MCV: 79.1 fL (ref 78.0–100.0)
Monocytes Absolute: 0.6 10*3/uL (ref 0.1–1.0)
Monocytes Relative: 9 %
NEUTROS ABS: 3.6 10*3/uL (ref 1.7–7.7)
Neutrophils Relative %: 54 %
Platelets: 361 10*3/uL (ref 150–400)
RBC: 3.63 MIL/uL — AB (ref 3.87–5.11)
RDW: 18.5 % — ABNORMAL HIGH (ref 11.5–15.5)
WBC: 6.6 10*3/uL (ref 4.0–10.5)

## 2016-08-07 MED ORDER — ALPRAZOLAM 0.25 MG PO TABS: 0.2500 mg | ORAL_TABLET | Freq: Two times a day (BID) | ORAL | 0 refills | Status: DC | PRN

## 2016-08-07 MED ORDER — TRAMADOL HCL 50 MG PO TABS: 50.0000 mg | ORAL_TABLET | Freq: Four times a day (QID) | ORAL | 0 refills | Status: DC | PRN

## 2016-08-07 NOTE — Telephone Encounter (Signed)
RX sent to [facility] [phone], Phone [phone]

## 2016-08-07 NOTE — Progress Notes (Signed)
Provider:  Veleta Miners Location:   [facility] Room Number: 159/P Place of Service:  SNF (31)  PCP: Tawni Carnes, PA-C Patient Care Team: Tawni Carnes, PA-C as PCP - General (Physician Assistant) Daneil Dolin, MD (Gastroenterology)  Extended Emergency Contact Information Primary Emergency Contact: [name] [place] of [place] Phone: [phone] Relation: Daughter Secondary Emergency Contact: [name], [place] [address] [name] of [place] Phone: [phone] Relation: Son  Code Status: DNR Goals of Care: Advanced Directive information Advanced Directives 08/07/2016  Does Patient Have a Medical Advance Directive? Yes  Type of Advance Directive Out of facility DNR (pink MOST or yellow form)  Does patient want to make changes to medical advance directive? No - Patient declined  Copy of [place] in Chart? -  Would patient like information on creating a medical advance directive? No - Patient declined  Pre-existing out of facility DNR order (yellow form or pink MOST form) -      Chief Complaint  Patient presents with  . New Admit To SNF    HPI: Patient is a 83 y.o. female seen today for admission to SNF for therapy after Decompression L2-3 with anterolateral fixation with plate and Lateral interbody grafting infusion with allograft L3-4 and L4-5 Patient has h/o Chronic Atrial Fibrillation, Acute on Chronic CHF with EF of 30-35 % and Dyspnea on Exertion. CAD S/P Drug Eluting Stent in 01/15. S/P Cath in 08/17 with RCA stenosis on Medical therapy, COPD, GERD, and anxiety.  Patient was electively admitted to undergo spinal surgery as described above. She has extensive Coronary history and seems to have Dyspnea on exertion. Patient is doing well in facility. She did have pain in her back with radiating pain to Left leg. But she says Ultram helps her and she doesn't want any thing stronger. She worked  with therapy and was able to use the walker.    Past Medical History:  Diagnosis Date  . Anxiety   . Arthritis   . Atrial fibrillation ([place])   . Bursitis    left shoulder  . Cataract   . CHF (congestive heart failure) (HCC)    EF 30-35% in 01/18  . Complication of anesthesia   . COPD (chronic obstructive pulmonary disease) ([place])   . Coronary atherosclerosis of native coronary artery    a. DES to CX, moderately severe stenosis RCA, mild stenosis LAD 04/2013  . Depression   . Dysphagia, unspecified(787.20)   . GERD (gastroesophageal reflux disease)    Hx Schatzki's ring, multiple EGD/ED last 01/06/2004  . Headache   . Heart disease   . Heart murmur    'a littel'  . History of anemia   . HTN (hypertension)    Hx of it  . Hyperlipemia   . Hyperlipidemia   . Internal hemorrhoids without mention of complication   . MI (myocardial infarction) ([place]) 2006  . Microscopic colitis 2003  . Other and unspecified hyperlipidemia   . Panic disorder without agoraphobia   . Paresthesia    hands, feet  . Pneumonia 12/2011  . PONV (postoperative nausea and vomiting)    'a little nausea"  . PVD (peripheral vascular disease) ([place])   . S/P colonoscopy 09/27/2001   internal hemorrhoids, desc colon inflam polyp, SB BX-chronic duodenitis, colitis  . Shortness of breath   . Sleep disorder    obstructive  . Thyroid disease    recent abnl TSH per  pt   Past Surgical History:  Procedure Laterality Date  . ABDOMINAL HYSTERECTOMY    . ANTERIOR AND POSTERIOR REPAIR     with resection of vagina  . ANTERIOR LAT LUMBAR FUSION N/A 08/01/2016   Procedure: Lumbar Two-Lumbar Five Transpsoas lateral interbody fusion with Lumbar Two-Three lateral plate fixation;  Surgeon: Kevan Ny Ditty, MD;  Location: [facility];  Service: Neurosurgery;  Laterality: N/A;  L2-5 Transpsoas lateral interbody fusion with L2-3 lateral plate fixation  . APPENDECTOMY    . BACK SURGERY    . BIOPSY  07/05/2015   Procedure:  BIOPSY;  Surgeon: Daneil Dolin, MD;  [facility];  Service: Endoscopy;;  gastric polyp biopsy, ascending colon biopsy  . BLADDER SUSPENSION  11/09/2011   Procedure: TRANSVAGINAL TAPE (TVT) PROCEDURE;  Surgeon: Marissa Nestle, MD;  Location: [facility];  Service: Urology;  Laterality: N/A;  . bladder tack  06/2010  . BREAST LUMPECTOMY  1998   left, benign  . CARDIAC CATHETERIZATION    . CARDIAC CATHETERIZATION    . CARDIAC CATHETERIZATION N/A 12/16/2015   Procedure: Left Heart Cath and Coronary Angiography;  Surgeon: Troy Sine, MD;  Location: [facility];  Service: Cardiovascular;  Laterality: N/A;  . [place]   left  . CHOLECYSTECTOMY  1998  . COLONOSCOPY  03/16/2011   multiple hyperplastic colon polyps, sigmoid diverticulosis, melanosis coli  . COLONOSCOPY WITH PROPOFOL N/A 07/05/2015   RMR:one 5 mm polyp in descending colon  . CORONARY ANGIOPLASTY WITH STENT PLACEMENT    . ESOPHAGEAL DILATION N/A 07/05/2015   Procedure: ESOPHAGEAL DILATION;  Surgeon: Daneil Dolin, MD;  [facility];  Service: Endoscopy;  Laterality: N/A;  . ESOPHAGOGASTRODUODENOSCOPY (EGD) WITH PROPOFOL N/A 07/05/2015   VQQ:[id number]  . JOINT REPLACEMENT Right 2007   right knee  . left hand surgery    . LEFT HEART CATHETERIZATION WITH CORONARY ANGIOGRAM N/A 05/14/2013   Procedure: LEFT HEART CATHETERIZATION WITH CORONARY ANGIOGRAM;  Surgeon: Blane Ohara, MD;  Location: [facility];  Service: Cardiovascular;  Laterality: N/A;  . left rotator cuff surgery    . LUMBAR LAMINECTOMY/DECOMPRESSION MICRODISCECTOMY N/A 10/11/2012   Procedure: LUMBAR LAMINECTOMY/DECOMPRESSION MICRODISCECTOMY 2 LEVELS;  Surgeon: Floyce Stakes, MD;  Location: [facility];  Service: Neurosurgery;  Laterality: N/A;  L3-4 L4-5 Laminectomy  . LUMBAR WOUND DEBRIDEMENT N/A 09/27/2015   Procedure: Exploration of Lumbar Wound w/ Repair CSF Leak/Lumbar Drain Placement;  Surgeon: Leeroy Cha, MD;   Location: [facility];  Service: Neurosurgery;  Laterality: N/A;  . MALONEY DILATION  03/16/2011   Gastritis. No H.pylori on bx. 52F maloney dilation with disruption of  occult cevical esophageal web  . NASAL SINUS SURGERY    . right knee replacement  2007  . right leg benign tumor    . SHOULDER SURGERY Left   . TONSILLECTOMY    . unspecified area, hysterectomy  1972   partial    reports that she quit smoking about 14 years ago. Her smoking use included Cigarettes. She started smoking about 68 years ago. She has a 64.00 pack-year smoking history. She has never used smokeless tobacco. She reports that she does not drink alcohol or use drugs. Social History   Social History  . Marital status: Divorced    Spouse name: N/A  . Number of children: 5  . Years of education: N/A   Occupational History  . retired    Social History Main Topics  .  Smoking status: Former Smoker    Packs/day: 1.00    Years: 64.00    Types: Cigarettes    Start date: 12/24/1947    Quit date: 11/17/2001  . Smokeless tobacco: Never Used     Comment: Quit smoking in 2003  . Alcohol use No  . Drug use: No  . Sexual activity: No   Other Topics Concern  . Not on file   Social History Narrative   Divorced.   Sister had colon perforation & died from complications in [place], [place]    Functional Status Survey:    Family History  Problem Relation Age of Onset  . Stroke Mother   . Parkinson's disease Father   . Coronary artery disease Other     family Hx-sons  . Cancer Other   . Stroke Other     family Hx  . Hypertension Other     family Hx  . Diabetes Brother   . Heart disease Son     before age 11  . Diabetes Son   . Stroke Daughter 56    Health Maintenance  Topic Date Due  . DEXA SCAN  10/14/2016 (Originally 10/23/1999)  . TETANUS/TDAP  10/14/2025 (Originally 10/22/1951)  . PNA vac Low Risk Adult (2 of 2 - PCV13) 10/15/2016  . INFLUENZA VACCINE  11/15/2016    Allergies  Allergen Reactions  .  Cephalosporins Diarrhea and Nausea Only    Lightheaded  . Levaquin [Levofloxacin In D5w] Swelling  . Macrodantin [Nitrofurantoin Macrocrystal] Swelling  . Phenothiazines Anaphylaxis and Hives  . Polysorbate Anaphylaxis  . Prednisone Shortness Of Breath  . Buspirone Itching  . Cardura [Doxazosin Mesylate] Itching  . Codeine Itching  . Acyclovir And Related Itching    Redness of skin  . Prochlorperazine Other (See Comments)    "Upset stomach"  . Ranexa [Ranolazine]     Severe drop in BP  . Atorvastatin Hives    Cramping; tolerates Crestor ok  . Ofloxacin Rash  . Other Itching and Rash    "WOOL"= make skin look like it has been burned  . Penicillins Other (See Comments)    Causes redness all over. Has patient had a PCN reaction causing immediate rash, facial/tongue/throat swelling, SOB or lightheadedness with hypotension: No Has patient had a PCN reaction causing severe rash involving mucus membranes or skin necrosis: No Has patient had a PCN reaction that required hospitalization No Has patient had a PCN reaction occurring within the last 10 years: No If all of the above answers are "NO", then may proceed with Cephalosporin use.   . Pimozide Hives and Itching    Allergies as of 08/07/2016      Reactions   Cephalosporins Diarrhea, Nausea Only   Lightheaded   Levaquin [levofloxacin In D5w] Swelling   Macrodantin [nitrofurantoin Macrocrystal] Swelling   Phenothiazines Anaphylaxis, Hives   Polysorbate Anaphylaxis   Prednisone Shortness Of Breath   Buspirone Itching   Cardura [doxazosin Mesylate] Itching   Codeine Itching   Acyclovir And Related Itching   Redness of skin   Prochlorperazine Other (See Comments)   "Upset stomach"   Ranexa [ranolazine]    Severe drop in BP   Atorvastatin Hives   Cramping; tolerates Crestor ok   Ofloxacin Rash   Other Itching, Rash   "WOOL"= make skin look like it has been burned   Penicillins Other (See Comments)   Causes redness all  over. Has patient had a PCN reaction causing immediate rash, facial/tongue/throat swelling, SOB or lightheadedness  with hypotension: No Has patient had a PCN reaction causing severe rash involving mucus membranes or skin necrosis: No Has patient had a PCN reaction that required hospitalization No Has patient had a PCN reaction occurring within the last 10 years: No If all of the above answers are "NO", then may proceed with Cephalosporin use.   Pimozide Hives, Itching      Medication List       Accurate as of 08/07/16 11:53 AM. Always use your most recent med list.          acetaminophen 500 MG tablet Commonly known as:  TYLENOL Take 500 mg by mouth daily as needed for headache.   ALPRAZolam 0.25 MG tablet Commonly known as:  XANAX Take 0.25 mg by mouth 2 (two) times daily as needed (for anxiety/panic attacks).   apixaban 5 MG Tabs tablet Commonly known as:  ELIQUIS Take 1 tablet (5 mg total) by mouth 2 (two) times daily.   bisacodyl 5 MG EC tablet Commonly known as:  DULCOLAX Take 1 tablet (5 mg total) by mouth daily as needed for moderate constipation.   DULoxetine 60 MG capsule Commonly known as:  CYMBALTA Take 60 mg by mouth at bedtime.   fluticasone 50 MCG/ACT nasal spray Commonly known as:  FLONASE Place 2 sprays into both nostrils daily.   furosemide 40 MG tablet Commonly known as:  LASIX TAKE 40 MG TWICE A DAY FOR 3 DAYS, THEN TAKE 40 MG DAILY FROM THEN ON   gabapentin 300 MG capsule Commonly known as:  NEURONTIN Take 1 capsule (300 mg total) by mouth 3 (three) times daily.   hydrocortisone 2.5 % rectal cream Commonly known as:  ANUSOL-HC Place 1 application rectally 2 (two) times daily.   hydrocortisone 25 MG suppository Commonly known as:  ANUSOL-HC Place 1 suppository (25 mg total) rectally every 12 (twelve) hours.   ipratropium-albuterol 0.5-2.5 (3) MG/3ML Soln Commonly known as:  DUONEB Take 3 mLs by nebulization every 6 (six) hours.     Magnesium 400 MG Tabs Take 400 mg by mouth daily.   metoprolol succinate 50 MG 24 hr tablet Commonly known as:  TOPROL-XL Take 1 1/2 tablets (75 mg ) daily   multivitamin with minerals Tabs tablet Take 1 tablet by mouth daily with lunch. Centrum   nitroGLYCERIN 0.4 MG SL tablet Commonly known as:  NITROSTAT Place 0.4 mg under the tongue every 5 (five) minutes as needed for chest pain. Reported on 08/04/2015   pantoprazole 40 MG tablet Commonly known as:  PROTONIX Take 40 mg by mouth daily.   traMADol 50 MG tablet Commonly known as:  ULTRAM Take 1 tablet (50 mg total) by mouth every 6 (six) hours as needed.       Review of Systems  Review of Systems  Constitutional: Negative for activity change, appetite change, chills, diaphoresis, fatigue and fever.  HENT: Negative for mouth sores, postnasal drip, rhinorrhea, sinus pain and sore throat.   Respiratory: Negative for apnea, cough, chest tightness, shortness of breath and wheezing.   Cardiovascular: Negative for chest pain, palpitations and leg swelling.  Gastrointestinal: Negative for abdominal distention, abdominal pain, constipation, diarrhea, nausea and vomiting.  Genitourinary: Negative for dysuria and frequency.  Musculoskeletal: Negative for arthralgias, joint swelling and myalgias.  Skin: Negative for rash.  Neurological: Negative for dizziness, syncope, weakness, light-headedness and numbness.  Psychiatric/Behavioral: Negative for behavioral problems, confusion and sleep disturbance.     Vitals:   08/07/16 1150  BP: 115/65  Pulse: (!) 108  Resp: 20  Temp: 97.3 F (36.3 C)  TempSrc: Oral   There is no height or weight on file to calculate BMI. Physical Exam  Constitutional: She is oriented to person, place, and time. She appears well-developed and well-nourished.  HENT:  Head: Normocephalic.  Mouth/Throat: Oropharynx is clear and moist.  Eyes: Pupils are equal, round, and reactive to light.  Neck: Neck  supple.  Cardiovascular: Normal rate and normal heart sounds.  An irregularly irregular rhythm present.  No murmur heard. Pulmonary/Chest: Effort normal and breath sounds normal. No respiratory distress. She has no wheezes. She has no rales.  Abdominal: Soft. Bowel sounds are normal. She exhibits no distension. There is no tenderness. There is no rebound.  Musculoskeletal:  Trace edema B/L  Neurological: She is alert and oriented to person, place, and time.  Had good strength in all extremities. Couldn't move Left leg due to pain.  Skin: Skin is warm and dry.  Psychiatric: She has a normal mood and affect. Her behavior is normal. Thought content normal.    Labs reviewed: Basic Metabolic Panel:  Recent Labs  02/20/16 0649  04/27/16 1432  06/12/16 1444 07/24/16 1314 08/01/16 0857 08/02/16 0504 08/07/16 0700  NA 131*  < >  --   --   --  135 140 137 137  K 4.7  < >  --   --   --  2.6* 3.6 3.7 3.3*  CL 97*  < >  --   --   --  97*  --  102 93*  CO2 25  < >  --   --   --  27  --  27 34*  GLUCOSE 152*  < >  --   --   --  113* 122* 113* 103*  BUN 45*  < >  --   --   --  16  --  13 19  CREATININE 1.43*  < >  --   < >  --  1.17*  --  1.15* 1.04*  CALCIUM 8.9  < >  --   --   --  9.0  --  8.4* 8.9  MG 2.3  --  1.6*  --  1.8  --   --   --   --   < > = values in this interval not displayed. Liver Function Tests:  Recent Labs  02/16/16 1627 03/20/16 1552 04/15/16 1423  AST 23 17 21   ALT 13* 14 15  ALKPHOS 66 94 118  BILITOT 0.5 0.6 1.0  PROT 7.5 7.3 7.8  ALBUMIN 3.8 3.8 3.6   No results for input(s): LIPASE, AMYLASE in the last 8760 hours. No results for input(s): AMMONIA in the last 8760 hours. CBC:  Recent Labs  04/15/16 1423 04/23/16 1147  08/02/16 0504 08/03/16 1237 08/07/16 0700  WBC 10.9* 14.6*  < > 8.2 8.9 6.6  NEUTROABS 8.4* 11.2*  --   --   --  3.6  HGB 10.2* 11.3*  < > 8.8* 8.6* 9.2*  HCT 31.6* 35.1*  < > 29.0* 27.2* 28.7*  MCV 79.4 77.0*  < > 79.9 80.5 79.1    PLT 383 447*  < > 248 240 361  < > = values in this interval not displayed. Cardiac Enzymes:  Recent Labs  02/17/16 0158 02/17/16 0755 03/20/16 1552  TROPONINI <0.03 <0.03 <0.03   BNP: Invalid input(s): POCBNP Lab Results  Component Value Date   HGBA1C 5.9 (H) 11/24/2013   Lab Results  Component  Value Date   TSH 3.000 04/15/2016   Lab Results  Component Value Date   [id number] 829 10/13/2015   No results found for: FOLATE Lab Results  Component Value Date   IRON 30 10/13/2015   TIBC 382 10/13/2015   FERRITIN 52 10/13/2015    Imaging and Procedures obtained prior to SNF admission: No results found.  Assessment/Plan PAF (paroxysmal atrial fibrillation)  Patient Heart rate is slightly elevated and it is normal for patient . D/W Daughter and she says it has been better since they increased the Toprol dose otherwise it goes up to 130. Patient continue to be asymptomatic. Continue on Eliquis.and Toprol  Status post lumbar spine surgery  Patient and daughter don't want to use anything more then Ultram. So will continue that and tylenol for pain control. Continue Therapy. She was walking with cane and walker at home. Per her daughter she has been having some memories issue at home.   CAD  She is on Medical management Staying stable   congestive heart failure Daughter wants [place] to make sure she doesn't get extra dose of lasix as ordered by hospital due to her getting really weak on BID dose of Lasix So decreased the doe to QD Check BMP Check weights POX QD  Gastroesophageal reflux disease  Continue on Protonix  Normocytic anemia Her Hgb is stable. Continue to monitor.  Hypokalemia Supplemented.   Family/ staff Communication: Daughter  Labs/tests ordered:  Repeat CBC and BMP Total time spent in this patient care encounter was 45_ minutes; greater than 50% of the visit spent counseling patient and coordinating care for problems addressed at this  encounter.

## 2016-08-09 ENCOUNTER — Non-Acute Institutional Stay (SKILLED_NURSING_FACILITY): Payer: Medicare Other | Admitting: Internal Medicine

## 2016-08-09 ENCOUNTER — Encounter: Payer: Self-pay | Admitting: Internal Medicine

## 2016-08-09 DIAGNOSIS — M4727 Other spondylosis with radiculopathy, lumbosacral region: Secondary | ICD-10-CM

## 2016-08-09 DIAGNOSIS — G8929 Other chronic pain: Secondary | ICD-10-CM

## 2016-08-09 DIAGNOSIS — M5440 Lumbago with sciatica, unspecified side: Secondary | ICD-10-CM

## 2016-08-09 DIAGNOSIS — E876 Hypokalemia: Secondary | ICD-10-CM

## 2016-08-09 NOTE — Progress Notes (Signed)
Location:   [place] Room Number: 159/P Place of Service:  SNF (31) Provider:  Justin Mend, PA-C  Patient Care Team: Tawni Carnes, PA-C as PCP - General (Physician Assistant) Daneil Dolin, MD (Gastroenterology)  Extended Emergency Contact Information Primary Emergency Contact: [name] [place] of [place] Phone: [phone] Relation: Daughter Secondary Emergency Contact: [name], [place] [address] [name] of [place] Phone: [phone] Relation: Son  Code Status:  DNR Goals of care: Advanced Directive information Advanced Directives 08/09/2016  Does Patient Have a Medical Advance Directive? Yes  Type of Advance Directive Out of facility DNR (pink MOST or yellow form)  Does patient want to make changes to medical advance directive? No - Patient declined  Copy of [place] in Chart? -  Would patient like information on creating a medical advance directive? No - Patient declined  Pre-existing out of facility DNR order (yellow form or pink MOST form) -     Chief Complaint  Patient presents with  . Acute Visit    Pain Management    HPI:  Pt is a 83 y.o. female seen today for an acute visit for Concerns of pain management.  She is here for therapy after decompression with L2-L3 with anterior lateral fixation with plate and lateral interbody grafting infusion with allograft of L3-4-and L4-5 she underwent elective spinal surgery.  She continues to complain of back pain apparently this has been present since the surgery.-It is intermittent but apparently is fairly excruciating when it occurs she says more so with movement.  She is also complaining of some numbness in her thighs that is intermittent however she says the numbness in her lower legs and feet is significantly improved.  Currently she is resting in bed comfortably she says a long as she is lying still she is okay but  with certain movements she has significant pain.  She has been very conservative in her pain management and she doesn't want strong pain meds.  She does have a listed allergy to codeine.  She has been receiving Tylenol and tramadol however apparently this is not helping at times with the pain-  Nursing has discussed this with her family as well who is concerned about too strong pain medication but they are open to starting a low-dose OxyContin.  Her vital signs appear to be stable     Past Medical History:  Diagnosis Date  . Anxiety   . Arthritis   . Atrial fibrillation ([place])   . Bursitis    left shoulder  . Cataract   . CHF (congestive heart failure) (HCC)    EF 55-60%  . Complication of anesthesia   . COPD (chronic obstructive pulmonary disease) ([place])   . Coronary atherosclerosis of native coronary artery    a. DES to CX, moderately severe stenosis RCA, mild stenosis LAD 04/2013  . Depression   . Dysphagia, unspecified(787.20)   . GERD (gastroesophageal reflux disease)    Hx Schatzki's ring, multiple EGD/ED last 01/06/2004  . Headache   . Heart disease   . Heart murmur    'a littel'  . History of anemia   . HTN (hypertension)    Hx of it  . Hyperlipemia   . Hyperlipidemia   . Internal hemorrhoids without mention of complication   . MI (myocardial infarction) ([place]) 2006  . Microscopic colitis 2003  . Other and unspecified hyperlipidemia   .  Panic disorder without agoraphobia   . Paresthesia    hands, feet  . Pneumonia 12/2011  . PONV (postoperative nausea and vomiting)    'a little nausea"  . PVD (peripheral vascular disease) ([place])   . S/P colonoscopy 09/27/2001   internal hemorrhoids, desc colon inflam polyp, SB BX-chronic duodenitis, colitis  . Shortness of breath   . Sleep disorder    obstructive  . Thyroid disease    recent abnl TSH per pt   Past Surgical History:  Procedure Laterality Date  . ABDOMINAL HYSTERECTOMY    . ANTERIOR AND POSTERIOR REPAIR      with resection of vagina  . ANTERIOR LAT LUMBAR FUSION N/A 08/01/2016   Procedure: Lumbar Two-Lumbar Five Transpsoas lateral interbody fusion with Lumbar Two-Three lateral plate fixation;  Surgeon: Kevan Ny Ditty, MD;  Location: [facility];  Service: Neurosurgery;  Laterality: N/A;  L2-5 Transpsoas lateral interbody fusion with L2-3 lateral plate fixation  . APPENDECTOMY    . BACK SURGERY    . BIOPSY  07/05/2015   Procedure: BIOPSY;  Surgeon: Daneil Dolin, MD;  [facility];  Service: Endoscopy;;  gastric polyp biopsy, ascending colon biopsy  . BLADDER SUSPENSION  11/09/2011   Procedure: TRANSVAGINAL TAPE (TVT) PROCEDURE;  Surgeon: Marissa Nestle, MD;  Location: [facility];  Service: Urology;  Laterality: N/A;  . bladder tack  06/2010  . BREAST LUMPECTOMY  1998   left, benign  . CARDIAC CATHETERIZATION    . CARDIAC CATHETERIZATION    . CARDIAC CATHETERIZATION N/A 12/16/2015   Procedure: Left Heart Cath and Coronary Angiography;  Surgeon: Troy Sine, MD;  Location: [facility];  Service: Cardiovascular;  Laterality: N/A;  . [place]   left  . CHOLECYSTECTOMY  1998  . COLONOSCOPY  03/16/2011   multiple hyperplastic colon polyps, sigmoid diverticulosis, melanosis coli  . COLONOSCOPY WITH PROPOFOL N/A 07/05/2015   RMR:one 5 mm polyp in descending colon  . CORONARY ANGIOPLASTY WITH STENT PLACEMENT    . ESOPHAGEAL DILATION N/A 07/05/2015   Procedure: ESOPHAGEAL DILATION;  Surgeon: Daneil Dolin, MD;  [facility];  Service: Endoscopy;  Laterality: N/A;  . ESOPHAGOGASTRODUODENOSCOPY (EGD) WITH PROPOFOL N/A 07/05/2015   DGL:OVFIEP  . JOINT REPLACEMENT Right 2007   right knee  . left hand surgery    . LEFT HEART CATHETERIZATION WITH CORONARY ANGIOGRAM N/A 05/14/2013   Procedure: LEFT HEART CATHETERIZATION WITH CORONARY ANGIOGRAM;  Surgeon: Blane Ohara, MD;  Location: [facility] CATH LAB;  Service: Cardiovascular;  Laterality: N/A;  . left rotator  cuff surgery    . LUMBAR LAMINECTOMY/DECOMPRESSION MICRODISCECTOMY N/A 10/11/2012   Procedure: LUMBAR LAMINECTOMY/DECOMPRESSION MICRODISCECTOMY 2 LEVELS;  Surgeon: Floyce Stakes, MD;  Location: [facility];  Service: Neurosurgery;  Laterality: N/A;  L3-4 L4-5 Laminectomy  . LUMBAR WOUND DEBRIDEMENT N/A 09/27/2015   Procedure: Exploration of Lumbar Wound w/ Repair CSF Leak/Lumbar Drain Placement;  Surgeon: Leeroy Cha, MD;  Location: [facility];  Service: Neurosurgery;  Laterality: N/A;  . MALONEY DILATION  03/16/2011   Gastritis. No H.pylori on bx. 54F maloney dilation with disruption of  occult cevical esophageal web  . NASAL SINUS SURGERY    . right knee replacement  2007  . right leg benign tumor    . SHOULDER SURGERY Left   . TONSILLECTOMY    . unspecified area, hysterectomy  1972   partial    Allergies  Allergen Reactions  . Cephalosporins Diarrhea and  Nausea Only    Lightheaded  . Levaquin [Levofloxacin In D5w] Swelling  . Macrodantin [Nitrofurantoin Macrocrystal] Swelling  . Phenothiazines Anaphylaxis and Hives  . Polysorbate Anaphylaxis  . Prednisone Shortness Of Breath  . Buspirone Itching  . Cardura [Doxazosin Mesylate] Itching  . Codeine Itching  . Acyclovir And Related Itching    Redness of skin  . Prochlorperazine Other (See Comments)    "Upset stomach"  . Ranexa [Ranolazine]     Severe drop in BP  . Atorvastatin Hives    Cramping; tolerates Crestor ok  . Ofloxacin Rash  . Other Itching and Rash    "WOOL"= make skin look like it has been burned  . Penicillins Other (See Comments)    Causes redness all over. Has patient had a PCN reaction causing immediate rash, facial/tongue/throat swelling, SOB or lightheadedness with hypotension: No Has patient had a PCN reaction causing severe rash involving mucus membranes or skin necrosis: No Has patient had a PCN reaction that required hospitalization No Has patient had a PCN reaction occurring within the last 10  years: No If all of the above answers are "NO", then may proceed with Cephalosporin use.   . Pimozide Hives and Itching    Outpatient Encounter Prescriptions as of 08/09/2016  Medication Sig  . acetaminophen (TYLENOL) 325 MG tablet Take 650 mg by mouth every 4 (four) hours as needed.  [name] Kitchen acetaminophen (TYLENOL) 500 MG tablet Take 500 mg by mouth daily as needed for headache.   . ALPRAZolam (XANAX) 0.25 MG tablet Take 1 tablet (0.25 mg total) by mouth 2 (two) times daily as needed (for anxiety/panic attacks).  [name] Kitchen apixaban (ELIQUIS) 5 MG TABS tablet Take 1 tablet (5 mg total) by mouth 2 (two) times daily.  . bisacodyl (DULCOLAX) 5 MG EC tablet Take 1 tablet (5 mg total) by mouth daily as needed for moderate constipation.  . DULoxetine (CYMBALTA) 60 MG capsule Take 60 mg by mouth at bedtime.   . fluticasone (FLONASE) 50 MCG/ACT nasal spray Place 2 sprays into both nostrils daily.   . furosemide (LASIX) 40 MG tablet TAKE 40 MG TWICE A DAY FOR 3 DAYS, THEN TAKE 40 MG DAILY FROM THEN ON  . gabapentin (NEURONTIN) 300 MG capsule Take 1 capsule (300 mg total) by mouth 3 (three) times daily.  . hydrocortisone (ANUSOL-HC) 25 MG suppository Place 1 suppository (25 mg total) rectally every 12 (twelve) hours.  [name] Kitchen ipratropium-albuterol (DUONEB) 0.5-2.5 (3) MG/3ML SOLN Take 3 mLs by nebulization every 6 (six) hours.  . Magnesium 400 MG TABS Take 400 mg by mouth daily.  . methocarbamol (ROBAXIN) 500 MG tablet Take 500 mg by mouth 2 (two) times daily as needed for muscle spasms.  . metoprolol succinate (TOPROL-XL) 50 MG 24 hr tablet Take 1 1/2 tablets (75 mg ) daily  . Multiple Vitamin (MULTIVITAMIN WITH MINERALS) TABS tablet Take 1 tablet by mouth daily with lunch. Centrum  . nitroGLYCERIN (NITROSTAT) 0.4 MG SL tablet Place 0.4 mg under the tongue every 5 (five) minutes as needed for chest pain. Reported on 08/04/2015  . oxyCODONE (OXYCONTIN) 10 mg 12 hr tablet Take 5 mg by mouth every 12 (twelve) hours.  .  pantoprazole (PROTONIX) 40 MG tablet Take 40 mg by mouth daily.   . potassium chloride SA (K-DUR,KLOR-CON) 20 MEQ tablet Take 40 mEq by mouth daily.  . traMADol (ULTRAM) 50 MG tablet Take 1 tablet (50 mg total) by mouth every 6 (six) hours as needed.  . [DISCONTINUED] hydrocortisone (ANUSOL-HC)  2.5 % rectal cream Place 1 application rectally 2 (two) times daily.   No facility-administered encounter medications on file as of 08/09/2016.     Review of Systems   In general she is not complaining of any fever or chills  Skin does not complain of rashes or itching.  Head ears eyes nose mouth and throat not complaining of any visual changes or sore throat.  Respiratory has shortness of breath at times on exertion but this is not new she says this is chronic.  Does not complain of cough.  Cardiac is not complaining of chest pain or palpitations   GI does not complaining of abdominal discomfort nausea vomiting diarrhea constipation since she had a bowel movement yesterday and urinates without difficulty.  Musculoskeletal does complain of back pain with some radiation to her legs this is intermittent and affected more with movement.  Neurologic is not complaining of dizziness headache again does complain at times of some numbness in her thigh area but says numbness in her lower legs and feet is improved.  Psych does have a history of anxiety fact per nursing had somewhat of an anxiety attack earlier today that responded to Xanax.    Immunization History  Administered Date(s) Administered  . Influenza,inj,Quad PF,36+ Mos 02/17/2015  . PPD Test 10/08/2015  . Pneumococcal Polysaccharide-23 10/16/2015   Pertinent  Health Maintenance Due  Topic Date Due  . DEXA SCAN  10/14/2016 (Originally 10/23/1999)  . PNA vac Low Risk Adult (2 of 2 - PCV13) 10/15/2016  . INFLUENZA VACCINE  11/15/2016   Fall Risk  05/18/2016 04/28/2016 03/24/2016 03/24/2016  Falls in the past year? No No No No  Risk for  fall due to : Medication side effect Medication side effect - Medication side effect   Functional Status Survey:    Vitals:   08/09/16 1209  BP: 114/71  Pulse: 76  Resp: 20  Temp: 97.6 F (36.4 C)  TempSrc: Oral  Weight is 148.2   Physical Exam   In general this is a pleasant elderly female in no distress lying comfortably in bed but says she has pain with movement.  Her skin is warm and dry she does have a well-healed surgical scar lower back.  Oropharynx clear mucous membranes moist.  Chest is clear to auscultation there is no labored breathing somewhat shallow air entry.  Heart is regular irregular rate and rhythm without murmur gallop around she does not really have significant lower extremity edema I would say this is trace pedal pulses are intact bilaterally as well as capillary refill.  Abdomen is soft nontender with positive bowel sounds.  Musculoskeletal is able to move all her extremities 4 extremities. Grip strength appears to be intact is able to move her lower extremities bilaterally limited exam since she is in bed but certainly can raise her legs against gravity and flex and extend there is some pain more in her thigh area when she does flex at the hip. There is a well-healed surgical scar lower back there is some tenderness to palpation of her lower back but I do not note any drainage bleeding or deformity or increased erythema  Neurologic   Touch  sensation appears to be intact legs bilaterally thighs bilaterally again strength appears to be intact as noted above  Cranial nerves are grossly intact her speech is clear.  Psych she is alert and oriented pleasant and appropriate      Labs reviewed:  Recent Labs  02/20/16 0649  04/27/16  1432  06/12/16 1444 07/24/16 1314 08/01/16 0857 08/02/16 0504 08/07/16 0700  NA 131*  < >  --   --   --  135 140 137 137  K 4.7  < >  --   --   --  2.6* 3.6 3.7 3.3*  CL 97*  < >  --   --   --  97*  --  102 93*    CO2 25  < >  --   --   --  27  --  27 34*  GLUCOSE 152*  < >  --   --   --  113* 122* 113* 103*  BUN 45*  < >  --   --   --  16  --  13 19  CREATININE 1.43*  < >  --   < >  --  1.17*  --  1.15* 1.04*  CALCIUM 8.9  < >  --   --   --  9.0  --  8.4* 8.9  MG 2.3  --  1.6*  --  1.8  --   --   --   --   < > = values in this interval not displayed.  Recent Labs  02/16/16 1627 03/20/16 1552 04/15/16 1423  AST 23 17 21   ALT 13* 14 15  ALKPHOS 66 94 118  BILITOT 0.5 0.6 1.0  PROT 7.5 7.3 7.8  ALBUMIN 3.8 3.8 3.6    Recent Labs  04/15/16 1423 04/23/16 1147  08/02/16 0504 08/03/16 1237 08/07/16 0700  WBC 10.9* 14.6*  < > 8.2 8.9 6.6  NEUTROABS 8.4* 11.2*  --   --   --  3.6  HGB 10.2* 11.3*  < > 8.8* 8.6* 9.2*  HCT 31.6* 35.1*  < > 29.0* 27.2* 28.7*  MCV 79.4 77.0*  < > 79.9 80.5 79.1  PLT 383 447*  < > 248 240 361  < > = values in this interval not displayed. Lab Results  Component Value Date   TSH 3.000 04/15/2016   Lab Results  Component Value Date   HGBA1C 5.9 (H) 11/24/2013   Lab Results  Component Value Date   CHOL 177 12/16/2015   HDL 35 (L) 12/16/2015   LDLCALC 103 (H) 12/16/2015   TRIG 197 (H) 12/16/2015   CHOLHDL 5.1 12/16/2015    Significant Diagnostic Results in last 30 days:  Dg Lumbar Spine 2-3 Views  Result Date: 08/01/2016 CLINICAL DATA:  83 y/o F; intraoperative fluoroscopy of spinal fusion. EXAM: DG C-ARM 61-120 MIN; LUMBAR SPINE - 2-3 VIEW COMPARISON:  09/24/2015 lumbar fluoroscopy. FINDINGS: Fluoro time of 1 minutes and 27 seconds. Posterior and interbody fusion of L3 through L5. Partially visualized lateral plate fixation of Z6-1. IMPRESSION: Intraoperative fluoroscopy of lumbar fusion. Electronically Signed   By: Kristine Garbe M.D.   On: 08/01/2016 14:43   Dg C-arm 61-120 Min  Result Date: 08/01/2016 CLINICAL DATA:  83 y/o F; intraoperative fluoroscopy of spinal fusion. EXAM: DG C-ARM 61-120 MIN; LUMBAR SPINE - 2-3 VIEW COMPARISON:   09/24/2015 lumbar fluoroscopy. FINDINGS: Fluoro time of 1 minutes and 27 seconds. Posterior and interbody fusion of L3 through L5. Partially visualized lateral plate fixation of W9-6. IMPRESSION: Intraoperative fluoroscopy of lumbar fusion. Electronically Signed   By: Kristine Garbe M.D.   On: 08/01/2016 14:43    Assessment/Plan  #1 history status post lumbar spine surgery-again her pain management has somewhat of a challenge she would minimize use of strong pain medicine-tramadol  this point does not appear totally effective-Will start low-dose OxyIR 5 mg twice a day to see if this gives some relief-nursing has discussed this with family and they are okay with this but this will have to be watched-also will add Robaxin 500 twice a day when necessary.  Also nursing will contact surgeon about her pain and numbness although this appears to be somewhat chronic and not a new development I  In fact the numbness has improved of her lower legs and feet-but would like to make surgeon aware of any possible changes.   #2 hypokalemia I do note her potassium was slightly lower there this week at 3.3 this is being supplemented will update a metabolic panel later this week to make sure electrolytes potassium are stable she is currently on 40 mEq of potassium she is also on Lasix 40 mg a day with a history of CHF  [id number]     Oralia Manis, [place]

## 2016-08-09 NOTE — Progress Notes (Signed)
This encounter was created in error - please disregard. This encounter was created in error - please disregard. This encounter was created in error - please disregard. 

## 2016-08-10 ENCOUNTER — Other Ambulatory Visit (HOSPITAL_COMMUNITY)
Admission: RE | Admit: 2016-08-10 | Discharge: 2016-08-10 | Disposition: A | Discharge status: Home / Self Care | Payer: No Typology Code available for payment source | Source: Skilled Nursing Facility | Attending: Internal Medicine | Admitting: Internal Medicine

## 2016-08-10 DIAGNOSIS — I5032 Chronic diastolic (congestive) heart failure: Secondary | ICD-10-CM | POA: Insufficient documentation

## 2016-08-10 LAB — BASIC METABOLIC PANEL
Anion gap: 10 (ref 5–15)
BUN: 18 mg/dL (ref 6–20)
CO2: 28 mmol/L (ref 22–32)
CREATININE: 1.14 mg/dL — AB (ref 0.44–1.00)
Calcium: 8.6 mg/dL — ABNORMAL LOW (ref 8.9–10.3)
Chloride: 96 mmol/L — ABNORMAL LOW (ref 101–111)
GFR calc Af Amer: 51 mL/min — ABNORMAL LOW (ref >60)
GFR calc non Af Amer: 44 mL/min — ABNORMAL LOW (ref >60)
GLUCOSE: 98 mg/dL (ref 65–99)
Potassium: 3.9 mmol/L (ref 3.5–5.1)
Sodium: 134 mmol/L — ABNORMAL LOW (ref 135–145)

## 2016-08-11 ENCOUNTER — Other Ambulatory Visit: Payer: Self-pay | Admitting: *Deleted

## 2016-08-11 MED ORDER — OXYCODONE HCL 5 MG PO TABS: ORAL_TABLET | ORAL | 0 refills | Status: DC

## 2016-08-11 NOTE — Telephone Encounter (Signed)
[facility] Nursing #[phone] Fax: [phone]   

## 2016-08-13 NOTE — Op Note (Signed)
08/01/2016  7:24 AM  PATIENT:  [name]  83 y.o. female  PRE-OPERATIVE DIAGNOSIS:  Lumbosacral spondylolisthesis (L2-3), lumbosacral radiculopathy, evidence of pedicle screw loosening L3 and L5  POST-OPERATIVE DIAGNOSIS:  Same  PROCEDURE:  L2-5 transpsoas lumbar interbody fusion; L2-3 lateral plating; use of BMP  SURGEON:  Aldean Ast, MD  ASSISTANTS: Kristeen Miss, MD  ANESTHESIA:   General  DRAINS: None   SPECIMEN:  None  INDICATION FOR PROCEDURE: 83 year old woman with adjacent segment degeneration and spondylolisthesis about an L3-5 posterolateral fusion.  There is also evidence of early pedicle screw loosening at L3 and L5.  She would not tolerate a large posterior revision operation and so I recommended L2-3 lateral interbody fusion with plate fixation do address the adjacent segment disease and L3-5 lateral interbody fusion to bolster her posterior construct. Patient understood the risks, benefits, and alternatives and potential outcomes and wished to proceed.  PROCEDURE DETAILS: The patient was brought to the operating room.  After smooth induction of general endotracheal anesthesia the patient was placed in the lateral position with the right side up and secured with tape.  Localizing views were taken with fluoroscopy.  The operative site was then prepped and draped in the usual sterile fashion.  The planned incisions were infiltrated with lidocaine with epinephrine.   The skin was sharply incised and soft tissue to the level of the oblique abdominal muscles was carried out with monopolar cautery.  I then bluntly dissected through the oblique muscles with care taken to preserve any nerves.  I encountered encountered transversalis fascia which was bluntly entered and this opened into an easily dissected fat plane consistent with the retroperitoneum.  I inserted the dilator and approached the posterior third of the L2-3 disc space.  I confirmed that I was not in contact  with any nerves of the lumbar plexus. A K-wire was passed into the disc space.  The tract was sequentially dilated and then the retractor was introduced.  I incised the disc space and then passed a Cobb elevator along each endplate and penetrated the annulus on the contralateral side.  I then used box cutters, a paddle, and a rasp to complete the discectomy.  A trial spacer was used to determine appropriate graft size and then a PEEK lordotic interbody graft packed with BMP and grafton was inserted.  A four hole plate was then positioned and screw tracts were created with an awl aimed relatively posteriorly.  Screws were placed and felt as though they were secure in bone.  They were finally tightened with a torque screwdriver.  The retractor was removed after hemostasis was confirmed.   The retractor was then repositioned to L3-4.  Discectomy was again performed.  A second interbody graft packed with BMP and grafton was inserted.  Despite using implant delivery slides there was significant subsidence of the spacer at this level.  Hemostasis was again confirmed and the retractor was removed.     A second incision was made for L4-5.  This process was repeated at L4-5. Discectomy was performed and spacer placed without complication.   The incisions were closed in layers with interrupted vicryl sutures.  The skin was closed with running monocryl stratafix sutures and dermabond.  The patient was then turned supine on a stretcher.   PATIENT DISPOSITION:  PACU - hemodynamically stable.   Delay start of Pharmacological VTE agent (>24hrs) due to surgical blood loss or risk of bleeding:  yes

## 2016-08-14 ENCOUNTER — Encounter (HOSPITAL_COMMUNITY)
Admission: RE | Admit: 2016-08-14 | Discharge: 2016-08-14 | Disposition: A | Discharge status: Home / Self Care | Payer: Medicare Other | Source: Skilled Nursing Facility | Attending: *Deleted | Admitting: *Deleted

## 2016-08-14 LAB — BASIC METABOLIC PANEL
Anion gap: 10 (ref 5–15)
BUN: 24 mg/dL — ABNORMAL HIGH (ref 6–20)
CALCIUM: 8.9 mg/dL (ref 8.9–10.3)
CO2: 29 mmol/L (ref 22–32)
Chloride: 94 mmol/L — ABNORMAL LOW (ref 101–111)
Creatinine, Ser: 1.16 mg/dL — ABNORMAL HIGH (ref 0.44–1.00)
GFR calc non Af Amer: 43 mL/min — ABNORMAL LOW (ref >60)
GFR, EST AFRICAN AMERICAN: 50 mL/min — AB (ref >60)
Glucose, Bld: 110 mg/dL — ABNORMAL HIGH (ref 65–99)
POTASSIUM: 4.1 mmol/L (ref 3.5–5.1)
Sodium: 133 mmol/L — ABNORMAL LOW (ref 135–145)

## 2016-08-15 ENCOUNTER — Encounter (HOSPITAL_COMMUNITY)
Admission: RE | Admit: 2016-08-15 | Discharge: 2016-08-15 | Disposition: A | Discharge status: Home / Self Care | Payer: Medicare Other | Source: Skilled Nursing Facility | Attending: Internal Medicine | Admitting: Internal Medicine

## 2016-08-15 DIAGNOSIS — R262 Difficulty in walking, not elsewhere classified: Secondary | ICD-10-CM | POA: Insufficient documentation

## 2016-08-15 DIAGNOSIS — M6281 Muscle weakness (generalized): Secondary | ICD-10-CM | POA: Insufficient documentation

## 2016-08-15 DIAGNOSIS — Z48811 Encounter for surgical aftercare following surgery on the nervous system: Secondary | ICD-10-CM | POA: Insufficient documentation

## 2016-08-15 DIAGNOSIS — R279 Unspecified lack of coordination: Secondary | ICD-10-CM | POA: Insufficient documentation

## 2016-08-15 DIAGNOSIS — D500 Iron deficiency anemia secondary to blood loss (chronic): Secondary | ICD-10-CM | POA: Insufficient documentation

## 2016-08-15 DIAGNOSIS — R293 Abnormal posture: Secondary | ICD-10-CM | POA: Insufficient documentation

## 2016-08-15 LAB — CBC WITH DIFFERENTIAL/PLATELET
Basophils Absolute: 0.1 10*3/uL (ref 0.0–0.1)
Basophils Relative: 1 %
Eosinophils Absolute: 0.2 10*3/uL (ref 0.0–0.7)
Eosinophils Relative: 3 %
HEMATOCRIT: 26.6 % — AB (ref 36.0–46.0)
HEMOGLOBIN: 8.4 g/dL — AB (ref 12.0–15.0)
LYMPHS ABS: 2.4 10*3/uL (ref 0.7–4.0)
LYMPHS PCT: 30 %
MCH: 25.6 pg — ABNORMAL LOW (ref 26.0–34.0)
MCHC: 31.6 g/dL (ref 30.0–36.0)
MCV: 81.1 fL (ref 78.0–100.0)
Monocytes Absolute: 0.7 10*3/uL (ref 0.1–1.0)
Monocytes Relative: 9 %
NEUTROS ABS: 4.6 10*3/uL (ref 1.7–7.7)
NEUTROS PCT: 57 %
Platelets: 449 10*3/uL — ABNORMAL HIGH (ref 150–400)
RBC: 3.28 MIL/uL — AB (ref 3.87–5.11)
RDW: 18.9 % — ABNORMAL HIGH (ref 11.5–15.5)
WBC: 7.9 10*3/uL (ref 4.0–10.5)

## 2016-08-21 ENCOUNTER — Non-Acute Institutional Stay (SKILLED_NURSING_FACILITY): Payer: Medicare Other | Admitting: Internal Medicine

## 2016-08-21 ENCOUNTER — Encounter: Payer: Self-pay | Admitting: Internal Medicine

## 2016-08-21 DIAGNOSIS — I480 Paroxysmal atrial fibrillation: Secondary | ICD-10-CM | POA: Diagnosis not present

## 2016-08-21 DIAGNOSIS — D649 Anemia, unspecified: Secondary | ICD-10-CM

## 2016-08-21 DIAGNOSIS — I5042 Chronic combined systolic (congestive) and diastolic (congestive) heart failure: Secondary | ICD-10-CM | POA: Diagnosis not present

## 2016-08-21 DIAGNOSIS — R0602 Shortness of breath: Secondary | ICD-10-CM

## 2016-08-21 NOTE — Progress Notes (Addendum)
Location:   [facility] Room Number: 159/P Place of Service:  SNF (31) Provider:  Maryella Shivers, Joellen Jersey, PA-C  Patient Care Team: Tawni Carnes, Hershal Coria as PCP - General (Physician Assistant) Daneil Dolin, MD (Gastroenterology)  Extended Emergency Contact Information Primary Emergency Contact: [name] [place] Phone: [phone] Relation: Daughter Secondary Emergency Contact: [name], [place] [address] [name] of [place] Phone: [phone] Relation: Son  Code Status:  DNR Goals of care: Advanced Directive information Advanced Directives 08/21/2016  Does Patient Have a Medical Advance Directive? Yes  Type of Advance Directive Out of facility DNR (pink MOST or yellow form)  Does patient want to make changes to medical advance directive? No - Patient declined  Copy of [place] in Chart? -  Would patient like information on creating a medical advance directive? No - Patient declined  Pre-existing out of facility DNR order (yellow form or pink MOST form) -     Chief Complaint  Patient presents with  . Acute Visit    Audible Wheezing    HPI:  Pt is a 83 y.o. female seen today for an acute visit for SOB and wheezing. Patient was admitted to  SNF for therapy after Decompression L2-3 with anterolateral fixation with plate and Lateral interbody grafting infusion with allograft L3-4 and L4-5 Patient has h/o Chronic Atrial Fibrillation, Acute on Chronic CHF with EF of 30-35 % and Dyspnea on Exertion. CAD S/P Drug Eluting Stent in 01/15. S/P Cath in 08/17 with RCA stenosis on Medical therapy, COPD, GERD, and anxiety.  Patient has been c/o SOB more then usual for past few days. She has Dry cough. No chest pain. Does have PND and keeps with her Bed elevated. Also noticed some wheezing. Some swelling in the legs . Patient is dyspneic on exertion at baseline.Is not on O2 at home. Her  weight is up from 148 lbs to 155 lbs in past few weeks.  Her pain is controlled on Pain meds. She is doing well with therapy but SOB stops her from doing too much.    Past Medical History:  Diagnosis Date  . Anxiety   . Arthritis   . Atrial fibrillation ([facility])   . Bursitis    left shoulder  . Cataract   . CHF (congestive heart failure) (HCC)    EF 55-60%  . Complication of anesthesia   . COPD (chronic obstructive pulmonary disease) ([facility])   . Coronary atherosclerosis of native coronary artery    a. DES to CX, moderately severe stenosis RCA, mild stenosis LAD 04/2013  . Depression   . Dysphagia, unspecified(787.20)   . GERD (gastroesophageal reflux disease)    Hx Schatzki's ring, multiple EGD/ED last 01/06/2004  . Headache   . Heart disease   . Heart murmur    'a littel'  . History of anemia   . HTN (hypertension)    Hx of it  . Hyperlipemia   . Hyperlipidemia   . Internal hemorrhoids without mention of complication   . MI (myocardial infarction) ([facility]) 2006  . Microscopic colitis 2003  . Other and unspecified hyperlipidemia   . Panic disorder without agoraphobia   . Paresthesia    hands, feet  . Pneumonia 12/2011  . PONV (postoperative nausea and vomiting)    'a little nausea"  . PVD (peripheral vascular disease) ([facility] [place])   . S/P colonoscopy 09/27/2001   internal hemorrhoids,  desc colon inflam polyp, SB BX-chronic duodenitis, colitis  . Shortness of breath   . Sleep disorder    obstructive  . Thyroid disease    recent abnl TSH per pt   Past Surgical History:  Procedure Laterality Date  . ABDOMINAL HYSTERECTOMY    . ANTERIOR AND POSTERIOR REPAIR     with resection of vagina  . ANTERIOR LAT LUMBAR FUSION N/A 08/01/2016   Procedure: Lumbar Two-Lumbar Five Transpsoas lateral interbody fusion with Lumbar Two-Three lateral plate fixation;  Surgeon: Kevan Ny Ditty, MD;  Location: [facility];  Service: Neurosurgery;  Laterality: N/A;  L2-5 Transpsoas lateral interbody fusion  with L2-3 lateral plate fixation  . APPENDECTOMY    . BACK SURGERY    . BIOPSY  07/05/2015   Procedure: BIOPSY;  Surgeon: Daneil Dolin, MD;  [facility];  Service: Endoscopy;;  gastric polyp biopsy, ascending colon biopsy  . BLADDER SUSPENSION  11/09/2011   Procedure: TRANSVAGINAL TAPE (TVT) PROCEDURE;  Surgeon: Marissa Nestle, MD;  Location: [facility];  Service: Urology;  Laterality: N/A;  . bladder tack  06/2010  . BREAST LUMPECTOMY  1998   left, benign  . CARDIAC CATHETERIZATION    . CARDIAC CATHETERIZATION    . CARDIAC CATHETERIZATION N/A 12/16/2015   Procedure: Left Heart Cath and Coronary Angiography;  Surgeon: Troy Sine, MD;  Location: [facility] CV LAB;  Service: Cardiovascular;  Laterality: N/A;  . [place]   left  . CHOLECYSTECTOMY  1998  . COLONOSCOPY  03/16/2011   multiple hyperplastic colon polyps, sigmoid diverticulosis, melanosis coli  . COLONOSCOPY WITH PROPOFOL N/A 07/05/2015   RMR:one 5 mm polyp in descending colon  . CORONARY ANGIOPLASTY WITH STENT PLACEMENT    . ESOPHAGEAL DILATION N/A 07/05/2015   Procedure: ESOPHAGEAL DILATION;  Surgeon: Daneil Dolin, MD;  [facility];  Service: Endoscopy;  Laterality: N/A;  . ESOPHAGOGASTRODUODENOSCOPY (EGD) WITH PROPOFOL N/A 07/05/2015   FXT:[id number]  . JOINT REPLACEMENT Right 2007   right knee  . left hand surgery    . LEFT HEART CATHETERIZATION WITH CORONARY ANGIOGRAM N/A 05/14/2013   Procedure: LEFT HEART CATHETERIZATION WITH CORONARY ANGIOGRAM;  Surgeon: Blane Ohara, MD;  Location: [facility] CATH LAB;  Service: Cardiovascular;  Laterality: N/A;  . left rotator cuff surgery    . LUMBAR LAMINECTOMY/DECOMPRESSION MICRODISCECTOMY N/A 10/11/2012   Procedure: LUMBAR LAMINECTOMY/DECOMPRESSION MICRODISCECTOMY 2 LEVELS;  Surgeon: Floyce Stakes, MD;  Location: [facility];  Service: Neurosurgery;  Laterality: N/A;  L3-4 L4-5 Laminectomy  . LUMBAR WOUND DEBRIDEMENT N/A 09/27/2015    Procedure: Exploration of Lumbar Wound w/ Repair CSF Leak/Lumbar Drain Placement;  Surgeon: Leeroy Cha, MD;  Location: [facility];  Service: Neurosurgery;  Laterality: N/A;  . MALONEY DILATION  03/16/2011   Gastritis. No H.pylori on bx. 22F maloney dilation with disruption of  occult cevical esophageal web  . NASAL SINUS SURGERY    . right knee replacement  2007  . right leg benign tumor    . SHOULDER SURGERY Left   . TONSILLECTOMY    . unspecified area, hysterectomy  1972   partial    Allergies  Allergen Reactions  . Cephalosporins Diarrhea and Nausea Only    Lightheaded  . Levaquin [Levofloxacin In D5w] Swelling  . Macrodantin [Nitrofurantoin Macrocrystal] Swelling  . Phenothiazines Anaphylaxis and Hives  . Polysorbate Anaphylaxis  . Prednisone Shortness Of Breath  . Buspirone Itching  . Cardura [Doxazosin Mesylate] Itching  .  Codeine Itching  . Acyclovir And Related Itching    Redness of skin  . Prochlorperazine Other (See Comments)    "Upset stomach"  . Ranexa [Ranolazine]     Severe drop in BP  . Atorvastatin Hives    Cramping; tolerates Crestor ok  . Ofloxacin Rash  . Other Itching and Rash    "WOOL"= make skin look like it has been burned  . Penicillins Other (See Comments)    Causes redness all over. Has patient had a PCN reaction causing immediate rash, facial/tongue/throat swelling, SOB or lightheadedness with hypotension: No Has patient had a PCN reaction causing severe rash involving mucus membranes or skin necrosis: No Has patient had a PCN reaction that required hospitalization No Has patient had a PCN reaction occurring within the last 10 years: No If all of the above answers are "NO", then may proceed with Cephalosporin use.   . Pimozide Hives and Itching    Outpatient Encounter Prescriptions as of 08/21/2016  Medication Sig  . acetaminophen (TYLENOL) 325 MG tablet Take 650 mg by mouth every 4 (four) hours as needed.  [name] Kitchen acetaminophen (TYLENOL) 500  MG tablet Take 500 mg by mouth daily as needed for headache.   [name] Kitchen apixaban (ELIQUIS) 5 MG TABS tablet Take 1 tablet (5 mg total) by mouth 2 (two) times daily.  . bisacodyl (DULCOLAX) 5 MG EC tablet Take 1 tablet (5 mg total) by mouth daily as needed for moderate constipation.  . DULoxetine (CYMBALTA) 60 MG capsule Take 60 mg by mouth at bedtime.   . fluticasone (FLONASE) 50 MCG/ACT nasal spray Place 2 sprays into both nostrils daily.   . furosemide (LASIX) 40 MG tablet TAKE 40 MG TWICE A DAY FOR 3 DAYS, THEN TAKE 40 MG DAILY FROM THEN ON  . gabapentin (NEURONTIN) 300 MG capsule Take 1 capsule (300 mg total) by mouth 3 (three) times daily.  . hydrocortisone (ANUSOL-HC) 25 MG suppository Place 1 suppository (25 mg total) rectally every 12 (twelve) hours.  [name] Kitchen ipratropium-albuterol (DUONEB) 0.5-2.5 (3) MG/3ML SOLN Take 3 mLs by nebulization every 6 (six) hours.  . Magnesium 400 MG TABS Take 400 mg by mouth daily.  . methocarbamol (ROBAXIN) 500 MG tablet Take 500 mg by mouth 2 (two) times daily as needed for muscle spasms.  . metoprolol succinate (TOPROL-XL) 50 MG 24 hr tablet Take 1 1/2 tablets (75 mg ) daily  . Multiple Vitamin (MULTIVITAMIN WITH MINERALS) TABS tablet Take 1 tablet by mouth daily with lunch. Centrum  . nitroGLYCERIN (NITROSTAT) 0.4 MG SL tablet Place 0.4 mg under the tongue every 5 (five) minutes as needed for chest pain. Reported on 08/04/2015  . oxyCODONE (ROXICODONE) 5 MG immediate release tablet Take one tablet by mouth twice daily as needed for pain  . pantoprazole (PROTONIX) 40 MG tablet Take 40 mg by mouth daily.   . potassium chloride SA (K-DUR,KLOR-CON) 20 MEQ tablet Take 20 mEq by mouth daily.   . [DISCONTINUED] ALPRAZolam (XANAX) 0.25 MG tablet Take 1 tablet (0.25 mg total) by mouth 2 (two) times daily as needed (for anxiety/panic attacks).  . [DISCONTINUED] oxyCODONE (OXYCONTIN) 10 mg 12 hr tablet Take 5 mg by mouth every 12 (twelve) hours.  . [DISCONTINUED] traMADol  (ULTRAM) 50 MG tablet Take 1 tablet (50 mg total) by mouth every 6 (six) hours as needed.   No facility-administered encounter medications on file as of 08/21/2016.     Review of Systems  Review of Systems  Constitutional: Negative for  appetite  change, chills, diaphoresis, fatigue and fever.  HENT: Negative for mouth sores, postnasal drip, rhinorrhea, sinus pain and sore throat.   Respiratory: Negative for apnea,  chest tightness  Cardiovascular: Negative for chest pain, palpitations   Gastrointestinal: Negative for abdominal distention, abdominal pain, constipation, diarrhea, nausea and vomiting.  Genitourinary: Negative for dysuria and frequency.  Musculoskeletal: Negative for arthralgias, joint swelling and myalgias.  Skin: Negative for rash.  Neurological: Negative for dizziness, syncope, weakness, light-headedness and numbness.  Psychiatric/Behavioral: Negative for behavioral problems, confusion and sleep disturbance.     Immunization History  Administered Date(s) Administered  . Influenza,inj,Quad PF,36+ Mos 02/17/2015  . PPD Test 10/08/2015  . Pneumococcal Polysaccharide-23 10/16/2015   Pertinent  Health Maintenance Due  Topic Date Due  . DEXA SCAN  10/14/2016 (Originally 10/23/1999)  . PNA vac Low Risk Adult (2 of 2 - PCV13) 10/15/2016  . INFLUENZA VACCINE  11/15/2016   Fall Risk  05/18/2016 04/28/2016 03/24/2016 03/24/2016  Falls in the past year? No No No No  Risk for fall due to : Medication side effect Medication side effect - Medication side effect   Functional Status Survey:    Vitals:   08/21/16 1004  BP: (!) 104/46  Pulse: 80  Resp: 20  Temp: 97.3 F (36.3 C)  TempSrc: Oral  SpO2: 95%   There is no height or weight on file to calculate BMI. Physical Exam  Constitutional: She is oriented to person, place, and time. She appears well-developed and well-nourished.  HENT:  Head: Normocephalic.  Mouth/Throat: Oropharynx is clear and moist.  Eyes: Pupils are  equal, round, and reactive to light.  Neck: Neck supple.  Cardiovascular: Normal rate and normal heart sounds.   Pulmonary/Chest: Effort normal and breath sounds normal.  Has expiratory Wheezing b/l with few rales in bases  Abdominal: Soft. Bowel sounds are normal. She exhibits no distension. There is no tenderness. There is no rebound.  Musculoskeletal: She exhibits edema.  Mild edema B/l  Neurological: She is alert and oriented to person, place, and time.  Skin: Skin is warm and dry.  Psychiatric: She has a normal mood and affect. Her behavior is normal. Thought content normal.    Labs reviewed:  Recent Labs  02/20/16 0649  04/27/16 1432  06/12/16 1444  08/07/16 0700 08/10/16 0425 08/14/16 0930  NA 131*  < >  --   --   --   < > 137 134* 133*  K 4.7  < >  --   --   --   < > 3.3* 3.9 4.1  CL 97*  < >  --   --   --   < > 93* 96* 94*  CO2 25  < >  --   --   --   < > 34* 28 29  GLUCOSE 152*  < >  --   --   --   < > 103* 98 110*  BUN 45*  < >  --   --   --   < > 19 18 24*  CREATININE 1.43*  < >  --   < >  --   < > 1.04* 1.14* 1.16*  CALCIUM 8.9  < >  --   --   --   < > 8.9 8.6* 8.9  MG 2.3  --  1.6*  --  1.8  --   --   --   --   < > = values in this interval not displayed.  Recent  Labs  02/16/16 1627 03/20/16 1552 04/15/16 1423  AST 23 17 21   ALT 13* 14 15  ALKPHOS 66 94 118  BILITOT 0.5 0.6 1.0  PROT 7.5 7.3 7.8  ALBUMIN 3.8 3.8 3.6    Recent Labs  04/23/16 1147  08/03/16 1237 08/07/16 0700 08/15/16 0700  WBC 14.6*  < > 8.9 6.6 7.9  NEUTROABS 11.2*  --   --  3.6 4.6  HGB 11.3*  < > 8.6* 9.2* 8.4*  HCT 35.1*  < > 27.2* 28.7* 26.6*  MCV 77.0*  < > 80.5 79.1 81.1  PLT 447*  < > 240 361 449*  < > = values in this interval not displayed. Lab Results  Component Value Date   TSH 3.000 04/15/2016   Lab Results  Component Value Date   HGBA1C 5.9 (H) 11/24/2013   Lab Results  Component Value Date   CHOL 177 12/16/2015   HDL 35 (L) 12/16/2015   LDLCALC 103  (H) 12/16/2015   TRIG 197 (H) 12/16/2015   CHOLHDL 5.1 12/16/2015    Significant Diagnostic Results in last 30 days:  Dg Lumbar Spine 2-3 Views  Result Date: 08/01/2016 CLINICAL DATA:  83 y/o F; intraoperative fluoroscopy of spinal fusion. EXAM: DG C-ARM 61-120 MIN; LUMBAR SPINE - 2-3 VIEW COMPARISON:  09/24/2015 lumbar fluoroscopy. FINDINGS: Fluoro time of 1 minutes and 27 seconds. Posterior and interbody fusion of L3 through L5. Partially visualized lateral plate fixation of T0-1. IMPRESSION: Intraoperative fluoroscopy of lumbar fusion. Electronically Signed   By: Kristine Garbe M.D.   On: 08/01/2016 14:43   Dg C-arm 61-120 Min  Result Date: 08/01/2016 CLINICAL DATA:  83 y/o F; intraoperative fluoroscopy of spinal fusion. EXAM: DG C-ARM 61-120 MIN; LUMBAR SPINE - 2-3 VIEW COMPARISON:  09/24/2015 lumbar fluoroscopy. FINDINGS: Fluoro time of 1 minutes and 27 seconds. Posterior and interbody fusion of L3 through L5. Partially visualized lateral plate fixation of S0-1. IMPRESSION: Intraoperative fluoroscopy of lumbar fusion. Electronically Signed   By: Kristine Garbe M.D.   On: 08/01/2016 14:43    Assessment/Plan  Shortness of Breadth At this time not sure if it is COPD Vs Fluid Overload. She is on home dose of Lasix Check Chest Xray to check for pulmonary congestion Check BMP, and BNP Follow up POX. It is 95% on 2l of Oxygen  PAF (paroxysmal atrial fibrillation)  Patient Heart rate is controlled . Continue on Eliquis.and Toprol CHF As above her weight is up . Will get Chest Xray and BNP before increasing the dose. Normocytic anemia Her Hgb has been low but stable. Will repeat cbc   Family/ staff Communication:   Labs/tests ordered:  CBC, BMP, BNP, Chest Xray Addendum Her Chest Xray was negative for any congestion Will increase her Duo nebs for now. Will wait for labs tomorrow

## 2016-08-22 ENCOUNTER — Encounter (HOSPITAL_COMMUNITY)
Admission: RE | Admit: 2016-08-22 | Discharge: 2016-08-22 | Disposition: A | Discharge status: Home / Self Care | Payer: Medicare Other | Source: Skilled Nursing Facility | Attending: Internal Medicine | Admitting: Internal Medicine

## 2016-08-22 ENCOUNTER — Non-Acute Institutional Stay (SKILLED_NURSING_FACILITY): Payer: Medicare Other | Admitting: Internal Medicine

## 2016-08-22 ENCOUNTER — Encounter: Payer: Self-pay | Admitting: Internal Medicine

## 2016-08-22 DIAGNOSIS — D649 Anemia, unspecified: Secondary | ICD-10-CM

## 2016-08-22 DIAGNOSIS — J449 Chronic obstructive pulmonary disease, unspecified: Secondary | ICD-10-CM | POA: Insufficient documentation

## 2016-08-22 DIAGNOSIS — J441 Chronic obstructive pulmonary disease with (acute) exacerbation: Secondary | ICD-10-CM | POA: Diagnosis not present

## 2016-08-22 DIAGNOSIS — I5032 Chronic diastolic (congestive) heart failure: Secondary | ICD-10-CM

## 2016-08-22 DIAGNOSIS — J3089 Other allergic rhinitis: Secondary | ICD-10-CM | POA: Insufficient documentation

## 2016-08-22 DIAGNOSIS — R293 Abnormal posture: Secondary | ICD-10-CM | POA: Insufficient documentation

## 2016-08-22 DIAGNOSIS — R279 Unspecified lack of coordination: Secondary | ICD-10-CM | POA: Insufficient documentation

## 2016-08-22 DIAGNOSIS — D500 Iron deficiency anemia secondary to blood loss (chronic): Secondary | ICD-10-CM | POA: Insufficient documentation

## 2016-08-22 DIAGNOSIS — Z48811 Encounter for surgical aftercare following surgery on the nervous system: Secondary | ICD-10-CM | POA: Insufficient documentation

## 2016-08-22 DIAGNOSIS — M6281 Muscle weakness (generalized): Secondary | ICD-10-CM | POA: Insufficient documentation

## 2016-08-22 DIAGNOSIS — G9009 Other idiopathic peripheral autonomic neuropathy: Secondary | ICD-10-CM | POA: Insufficient documentation

## 2016-08-22 DIAGNOSIS — R262 Difficulty in walking, not elsewhere classified: Secondary | ICD-10-CM | POA: Insufficient documentation

## 2016-08-22 LAB — BASIC METABOLIC PANEL
ANION GAP: 9 (ref 5–15)
BUN: 18 mg/dL (ref 6–20)
CALCIUM: 8.9 mg/dL (ref 8.9–10.3)
CO2: 30 mmol/L (ref 22–32)
CREATININE: 1.12 mg/dL — AB (ref 0.44–1.00)
Chloride: 98 mmol/L — ABNORMAL LOW (ref 101–111)
GFR, EST AFRICAN AMERICAN: 52 mL/min — AB (ref >60)
GFR, EST NON AFRICAN AMERICAN: 45 mL/min — AB (ref >60)
Glucose, Bld: 120 mg/dL — ABNORMAL HIGH (ref 65–99)
Potassium: 4 mmol/L (ref 3.5–5.1)
SODIUM: 137 mmol/L (ref 135–145)

## 2016-08-22 LAB — CBC WITH DIFFERENTIAL/PLATELET
BASOS ABS: 0.1 10*3/uL (ref 0.0–0.1)
BASOS PCT: 1 %
EOS ABS: 0.3 10*3/uL (ref 0.0–0.7)
EOS PCT: 4 %
HCT: 26.4 % — ABNORMAL LOW (ref 36.0–46.0)
Hemoglobin: 8.3 g/dL — ABNORMAL LOW (ref 12.0–15.0)
Lymphocytes Relative: 34 %
Lymphs Abs: 2.5 10*3/uL (ref 0.7–4.0)
MCH: 25.6 pg — ABNORMAL LOW (ref 26.0–34.0)
MCHC: 31.4 g/dL (ref 30.0–36.0)
MCV: 81.5 fL (ref 78.0–100.0)
MONO ABS: 0.7 10*3/uL (ref 0.1–1.0)
MONOS PCT: 10 %
NEUTROS ABS: 3.8 10*3/uL (ref 1.7–7.7)
Neutrophils Relative %: 51 %
PLATELETS: 404 10*3/uL — AB (ref 150–400)
RBC: 3.24 MIL/uL — ABNORMAL LOW (ref 3.87–5.11)
RDW: 18.3 % — ABNORMAL HIGH (ref 11.5–15.5)
WBC: 7.3 10*3/uL (ref 4.0–10.5)

## 2016-08-22 LAB — MAGNESIUM: MAGNESIUM: 2 mg/dL (ref 1.7–2.4)

## 2016-08-22 LAB — BRAIN NATRIURETIC PEPTIDE: B Natriuretic Peptide: 736 pg/mL — ABNORMAL HIGH (ref 0.0–100.0)

## 2016-08-22 NOTE — Progress Notes (Signed)
Location:   [facility] Room Number: 159/P Place of Service:  SNF (31) Provider:  Maryella Shivers, Joellen [place], PA-C  Patient Care Team: Tawni Carnes, Hershal Coria as PCP - General (Physician Assistant) Daneil Dolin, MD (Gastroenterology)  Extended Emergency Contact Information Primary Emergency Contact: [name] [place] of [place] Phone: [phone] Relation: Daughter Secondary Emergency Contact: [name], [place] [address] [name] of [place] Phone: [phone] Relation: Son  Code Status:  DNR Goals of care: Advanced Directive information Advanced Directives 08/22/2016  Does Patient Have a Medical Advance Directive? Yes  Type of Advance Directive Out of facility DNR (pink MOST or yellow form)  Does patient want to make changes to medical advance directive? No - Patient declined  Copy of [place] in Chart? -  Would patient like information on creating a medical advance directive? No - Patient declined  Pre-existing out of facility DNR order (yellow form or pink MOST form) -     Chief Complaint  Patient presents with  . Acute Visit    Possible CHF    HPI:  Pt is a 83 y.o. female seen today for an acute visit for her SOB.   Patient has h/o Chronic Atrial Fibrillation, Acute on Chronic CHF with EF of 30-35 % and Dyspnea on Exertion. CAD S/P Drug Eluting Stent in 01/15. S/P Cath in 08/17 with RCA stenosis on Medical therapy, COPD, GERD, and anxiety  Patient was admitted to  SNF for therapy after Decompression L2-3 with anterolateral fixation with plate and Lateral interbody grafting infusion with allograft L3-4 and L4-5  She was seen yesterday for Expiratory wheezing cough and SOB. Her Chest Xray was negative for any pulmonary  congestion. Though her weight was up by almostt 7 lbs. Her Nebs were increased to 3 times a day. She did have labs done this morning and her BNP is high at 736. She just  came from therapy. She says she continues to be SOB after therapy. No Chest pain. Has cough with yellow mucus. No fever or chills.   Past Medical History:  Diagnosis Date  . Anxiety   . Arthritis   . Atrial fibrillation ([place])   . Bursitis    left shoulder  . Cataract   . CHF (congestive heart failure) (HCC)    EF 55-60%  . Complication of anesthesia   . COPD (chronic obstructive pulmonary disease) ([place])   . Coronary atherosclerosis of native coronary artery    a. DES to CX, moderately severe stenosis RCA, mild stenosis LAD 04/2013  . Depression   . Dysphagia, unspecified(787.20)   . GERD (gastroesophageal reflux disease)    Hx Schatzki's ring, multiple EGD/ED last 01/06/2004  . Headache   . Heart disease   . Heart murmur    'a littel'  . History of anemia   . HTN (hypertension)    Hx of it  . Hyperlipemia   . Hyperlipidemia   . Internal hemorrhoids without mention of complication   . MI (myocardial infarction) ([place]) 2006  . Microscopic colitis 2003  . Other and unspecified hyperlipidemia   . Panic disorder without agoraphobia   . Paresthesia    hands, feet  . Pneumonia 12/2011  . PONV (postoperative nausea and vomiting)    'a little nausea"  . PVD (peripheral vascular disease) ([place])   . S/P colonoscopy 09/27/2001   internal hemorrhoids, desc colon inflam polyp, SB BX-chronic duodenitis,  colitis  . Shortness of breath   . Sleep disorder    obstructive  . Thyroid disease    recent abnl TSH per pt   Past Surgical History:  Procedure Laterality Date  . ABDOMINAL HYSTERECTOMY    . ANTERIOR AND POSTERIOR REPAIR     with resection of vagina  . ANTERIOR LAT LUMBAR FUSION N/A 08/01/2016   Procedure: Lumbar Two-Lumbar Five Transpsoas lateral interbody fusion with Lumbar Two-Three lateral plate fixation;  Surgeon: Kevan Ny Ditty, MD;  Location: [facility];  Service: Neurosurgery;  Laterality: N/A;  L2-5 Transpsoas lateral interbody fusion with L2-3 lateral plate fixation  .  APPENDECTOMY    . BACK SURGERY    . BIOPSY  07/05/2015   Procedure: BIOPSY;  Surgeon: Daneil Dolin, MD;  [facility];  Service: Endoscopy;;  gastric polyp biopsy, ascending colon biopsy  . BLADDER SUSPENSION  11/09/2011   Procedure: TRANSVAGINAL TAPE (TVT) PROCEDURE;  Surgeon: Marissa Nestle, MD;  Location: [facility];  Service: Urology;  Laterality: N/A;  . bladder tack  06/2010  . BREAST LUMPECTOMY  1998   left, benign  . CARDIAC CATHETERIZATION    . CARDIAC CATHETERIZATION    . CARDIAC CATHETERIZATION N/A 12/16/2015   Procedure: Left Heart Cath and Coronary Angiography;  Surgeon: Troy Sine, MD;  Location: [facility] CV LAB;  Service: Cardiovascular;  Laterality: N/A;  . [place]   left  . CHOLECYSTECTOMY  1998  . COLONOSCOPY  03/16/2011   multiple hyperplastic colon polyps, sigmoid diverticulosis, melanosis coli  . COLONOSCOPY WITH PROPOFOL N/A 07/05/2015   RMR:one 5 mm polyp in descending colon  . CORONARY ANGIOPLASTY WITH STENT PLACEMENT    . ESOPHAGEAL DILATION N/A 07/05/2015   Procedure: ESOPHAGEAL DILATION;  Surgeon: Daneil Dolin, MD;  [facility];  Service: Endoscopy;  Laterality: N/A;  . ESOPHAGOGASTRODUODENOSCOPY (EGD) WITH PROPOFOL N/A 07/05/2015   [id number]  . JOINT REPLACEMENT Right 2007   right knee  . left hand surgery    . LEFT HEART CATHETERIZATION WITH CORONARY ANGIOGRAM N/A 05/14/2013   Procedure: LEFT HEART CATHETERIZATION WITH CORONARY ANGIOGRAM;  Surgeon: Blane Ohara, MD;  Location: [facility] CATH LAB;  Service: Cardiovascular;  Laterality: N/A;  . left rotator cuff surgery    . LUMBAR LAMINECTOMY/DECOMPRESSION MICRODISCECTOMY N/A 10/11/2012   Procedure: LUMBAR LAMINECTOMY/DECOMPRESSION MICRODISCECTOMY 2 LEVELS;  Surgeon: Floyce Stakes, MD;  Location: [facility];  Service: Neurosurgery;  Laterality: N/A;  L3-4 L4-5 Laminectomy  . LUMBAR WOUND DEBRIDEMENT N/A 09/27/2015   Procedure: Exploration of Lumbar Wound w/  Repair CSF Leak/Lumbar Drain Placement;  Surgeon: Leeroy Cha, MD;  Location: [facility] OR[facility];  Service: Neurosurgery;  Laterality: N/A;  . MALONEY DILATION  03/16/2011   Gastritis. No H.pylori on bx. 70F maloney dilation with disruption of  occult cevical esophageal web  . NASAL SINUS SURGERY    . right knee replacement  2007  . right leg benign tumor    . SHOULDER SURGERY Left   . TONSILLECTOMY    . unspecified area, hysterectomy  1972   partial    Allergies  Allergen Reactions  . Cephalosporins Diarrhea and Nausea Only    Lightheaded  . Levaquin [Levofloxacin In D5w] Swelling  . Macrodantin [Nitrofurantoin Macrocrystal] Swelling  . Phenothiazines Anaphylaxis and Hives  . Polysorbate Anaphylaxis  . Prednisone Shortness Of Breath  . Buspirone Itching  . Cardura [Doxazosin Mesylate] Itching  . Codeine Itching  . Acyclovir And Related  Itching    Redness of skin  . Prochlorperazine Other (See Comments)    "Upset stomach"  . Ranexa [Ranolazine]     Severe drop in BP  . Atorvastatin Hives    Cramping; tolerates Crestor ok  . Ofloxacin Rash  . Other Itching and Rash    "WOOL"= make skin look like it has been burned  . Penicillins Other (See Comments)    Causes redness all over. Has patient had a PCN reaction causing immediate rash, facial/tongue/throat swelling, SOB or lightheadedness with hypotension: No Has patient had a PCN reaction causing severe rash involving mucus membranes or skin necrosis: No Has patient had a PCN reaction that required hospitalization No Has patient had a PCN reaction occurring within the last 10 years: No If all of the above answers are "NO", then may proceed with Cephalosporin use.   . Pimozide Hives and Itching    Outpatient Encounter Prescriptions as of 08/22/2016  Medication Sig  . acetaminophen (TYLENOL) 325 MG tablet Take 650 mg by mouth every 4 (four) hours as needed.  [name] Kitchen acetaminophen (TYLENOL) 500 MG tablet Take 500 mg by mouth daily as  needed for headache.   [name] Kitchen apixaban (ELIQUIS) 5 MG TABS tablet Take 1 tablet (5 mg total) by mouth 2 (two) times daily.  . bisacodyl (DULCOLAX) 5 MG EC tablet Take 1 tablet (5 mg total) by mouth daily as needed for moderate constipation.  . DULoxetine (CYMBALTA) 60 MG capsule Take 60 mg by mouth at bedtime.   . fluticasone (FLONASE) 50 MCG/ACT nasal spray Place 2 sprays into both nostrils daily.   . furosemide (LASIX) 40 MG tablet TAKE 40 MG TWICE A DAY FOR 3 DAYS, THEN TAKE 40 MG DAILY FROM THEN ON  . gabapentin (NEURONTIN) 300 MG capsule Take 1 capsule (300 mg total) by mouth 3 (three) times daily.  . hydrocortisone (ANUSOL-HC) 25 MG suppository Place 1 suppository (25 mg total) rectally every 12 (twelve) hours.  [name] Kitchen ipratropium-albuterol (DUONEB) 0.5-2.5 (3) MG/3ML SOLN Take 3 mLs by nebulization every 6 (six) hours.  . Magnesium 400 MG TABS Take 400 mg by mouth daily.  . methocarbamol (ROBAXIN) 500 MG tablet Take 500 mg by mouth 2 (two) times daily as needed for muscle spasms.  . metoprolol succinate (TOPROL-XL) 50 MG 24 hr tablet Take 1 1/2 tablets (75 mg ) daily  . Multiple Vitamin (MULTIVITAMIN WITH MINERALS) TABS tablet Take 1 tablet by mouth daily with lunch. Centrum  . nitroGLYCERIN (NITROSTAT) 0.4 MG SL tablet Place 0.4 mg under the tongue every 5 (five) minutes as needed for chest pain. Reported on 08/04/2015  . oxyCODONE (ROXICODONE) 5 MG immediate release tablet Take one tablet by mouth twice daily as needed for pain  . pantoprazole (PROTONIX) 40 MG tablet Take 40 mg by mouth daily.   . potassium chloride SA (K-DUR,KLOR-CON) 20 MEQ tablet Take 20 mEq by mouth daily.    No facility-administered encounter medications on file as of 08/22/2016.      Review of Systems  Immunization History  Administered Date(s) Administered  . Influenza,inj,Quad PF,36+ Mos 02/17/2015  . PPD Test 10/08/2015  . Pneumococcal Polysaccharide-23 10/16/2015   Pertinent  Health Maintenance Due  Topic Date  Due  . DEXA SCAN  10/14/2016 (Originally 10/23/1999)  . PNA vac Low Risk Adult (2 of 2 - PCV13) 10/15/2016  . INFLUENZA VACCINE  11/15/2016   Fall Risk  05/18/2016 04/28/2016 03/24/2016 03/24/2016  Falls in the past year? No No No No  Risk  for fall due to : Medication side effect Medication side effect - Medication side effect   Functional Status Survey:    Vitals:   08/22/16 1411  BP: 130/76  Pulse: 70  Resp: 18  Temp: 98 F (36.7 C)   There is no height or weight on file to calculate BMI. Physical Exam  Constitutional: She appears well-developed and well-nourished.  HENT:  Head: Normocephalic.  Mouth/Throat: Oropharynx is clear and moist.  Eyes: Pupils are equal, round, and reactive to light.  Neck: Neck supple.  Cardiovascular: Normal rate and normal heart sounds.   Pulmonary/Chest: Effort normal.  Occasional b/L Wheezing.  Abdominal: Soft. Bowel sounds are normal. She exhibits no distension. There is no tenderness. There is no rebound.  Musculoskeletal:  Mild edema in Both LE.  Neurological: She is alert.  Skin: Skin is warm and dry.  Psychiatric: She has a normal mood and affect. Her behavior is normal. Thought content normal.    Labs reviewed:  Recent Labs  04/27/16 1432  06/12/16 1444  08/10/16 0425 08/14/16 0930 08/22/16 0700  NA  --   --   --   < > 134* 133* 137  K  --   --   --   < > 3.9 4.1 4.0  CL  --   --   --   < > 96* 94* 98*  CO2  --   --   --   < > 28 29 30   GLUCOSE  --   --   --   < > 98 110* 120*  BUN  --   --   --   < > 18 24* 18  CREATININE  --   < >  --   < > 1.14* 1.16* 1.12*  CALCIUM  --   --   --   < > 8.6* 8.9 8.9  MG 1.6*  --  1.8  --   --   --  2.0  < > = values in this interval not displayed.  Recent Labs  02/16/16 1627 03/20/16 1552 04/15/16 1423  AST 23 17 21   ALT 13* 14 15  ALKPHOS 66 94 118  BILITOT 0.5 0.6 1.0  PROT 7.5 7.3 7.8  ALBUMIN 3.8 3.8 3.6    Recent Labs  08/07/16 0700 08/15/16 0700 08/22/16 0700  WBC 6.6  7.9 7.3  NEUTROABS 3.6 4.6 3.8  HGB 9.2* 8.4* 8.3*  HCT 28.7* 26.6* 26.4*  MCV 79.1 81.1 81.5  PLT 361 449* 404*   Lab Results  Component Value Date   TSH 3.000 04/15/2016   Lab Results  Component Value Date   HGBA1C 5.9 (H) 11/24/2013   Lab Results  Component Value Date   CHOL 177 12/16/2015   HDL 35 (L) 12/16/2015   LDLCALC 103 (H) 12/16/2015   TRIG 197 (H) 12/16/2015   CHOLHDL 5.1 12/16/2015    Significant Diagnostic Results in last 30 days:  Dg Lumbar Spine 2-3 Views  Result Date: 08/01/2016 CLINICAL DATA:  83 y/o F; intraoperative fluoroscopy of spinal fusion. EXAM: DG C-ARM 61-120 MIN; LUMBAR SPINE - 2-3 VIEW COMPARISON:  09/24/2015 lumbar fluoroscopy. FINDINGS: Fluoro time of 1 minutes and 27 seconds. Posterior and interbody fusion of L3 through L5. Partially visualized lateral plate fixation of W1-1. IMPRESSION: Intraoperative fluoroscopy of lumbar fusion. Electronically Signed   By: Kristine Garbe M.D.   On: 08/01/2016 14:43   Dg C-arm 61-120 Min  Result Date: 08/01/2016 CLINICAL DATA:  83 y/o F; intraoperative  fluoroscopy of spinal fusion. EXAM: DG C-ARM 61-120 MIN; LUMBAR SPINE - 2-3 VIEW COMPARISON:  09/24/2015 lumbar fluoroscopy. FINDINGS: Fluoro time of 1 minutes and 27 seconds. Posterior and interbody fusion of L3 through L5. Partially visualized lateral plate fixation of B1-6. IMPRESSION: Intraoperative fluoroscopy of lumbar fusion. Electronically Signed   By: Kristine Garbe M.D.   On: 08/01/2016 14:43    Assessment/Plan Chronic diastolic heart failure  With high BNP. D/W daughter as they were against before to increase her Lasix as they think she gets very Weak on high doses of Lasix. So will increase it to 40mg  in morning and 20mg  in evening. Follow up labs in few days. They have agreed with plan.  COPD exacerbation  Continue Nebs. Do not think she needs Antibiotics ar steroids right now.  Anemia She had Iron studies done in 2017 and  showed low iron stores Will start her on Iron.   Family/ staff Communication:   Labs/tests ordered:  Bmp in 1 week. D/W Daughter. Total time spent in this patient care encounter was 25_ minutes; greater than 50% of the visit spent counseling patient and coordinating care for problems addressed at this encounter.

## 2016-08-24 DIAGNOSIS — M4316 Spondylolisthesis, lumbar region: Secondary | ICD-10-CM | POA: Diagnosis not present

## 2016-08-28 ENCOUNTER — Encounter (HOSPITAL_COMMUNITY)
Admission: RE | Admit: 2016-08-28 | Discharge: 2016-08-28 | Disposition: A | Discharge status: Home / Self Care | Payer: Medicare Other | Source: Skilled Nursing Facility | Attending: Internal Medicine | Admitting: Internal Medicine

## 2016-08-28 DIAGNOSIS — J3089 Other allergic rhinitis: Secondary | ICD-10-CM | POA: Insufficient documentation

## 2016-08-28 DIAGNOSIS — Z48811 Encounter for surgical aftercare following surgery on the nervous system: Secondary | ICD-10-CM | POA: Insufficient documentation

## 2016-08-28 DIAGNOSIS — R262 Difficulty in walking, not elsewhere classified: Secondary | ICD-10-CM | POA: Insufficient documentation

## 2016-08-28 DIAGNOSIS — M6281 Muscle weakness (generalized): Secondary | ICD-10-CM | POA: Insufficient documentation

## 2016-08-28 LAB — CBC
HCT: 28.3 % — ABNORMAL LOW (ref 36.0–46.0)
Hemoglobin: 8.8 g/dL — ABNORMAL LOW (ref 12.0–15.0)
MCH: 25.6 pg — ABNORMAL LOW (ref 26.0–34.0)
MCHC: 31.1 g/dL (ref 30.0–36.0)
MCV: 82.3 fL (ref 78.0–100.0)
Platelets: 378 K/uL (ref 150–400)
RBC: 3.44 MIL/uL — ABNORMAL LOW (ref 3.87–5.11)
RDW: 18.5 % — ABNORMAL HIGH (ref 11.5–15.5)
WBC: 9.1 K/uL (ref 4.0–10.5)

## 2016-08-28 LAB — BASIC METABOLIC PANEL WITH GFR
Anion gap: 10 (ref 5–15)
BUN: 16 mg/dL (ref 6–20)
CO2: 29 mmol/L (ref 22–32)
Calcium: 9 mg/dL (ref 8.9–10.3)
Chloride: 96 mmol/L — ABNORMAL LOW (ref 101–111)
Creatinine, Ser: 1.14 mg/dL — ABNORMAL HIGH (ref 0.44–1.00)
GFR calc Af Amer: 51 mL/min — ABNORMAL LOW (ref >60)
GFR calc non Af Amer: 44 mL/min — ABNORMAL LOW (ref >60)
Glucose, Bld: 109 mg/dL — ABNORMAL HIGH (ref 65–99)
Potassium: 4 mmol/L (ref 3.5–5.1)
Sodium: 135 mmol/L (ref 135–145)

## 2016-08-30 ENCOUNTER — Non-Acute Institutional Stay (SKILLED_NURSING_FACILITY): Payer: Medicare Other | Admitting: Internal Medicine

## 2016-08-30 ENCOUNTER — Encounter: Payer: Self-pay | Admitting: Internal Medicine

## 2016-08-30 DIAGNOSIS — Z9889 Other specified postprocedural states: Secondary | ICD-10-CM

## 2016-08-30 DIAGNOSIS — D649 Anemia, unspecified: Secondary | ICD-10-CM

## 2016-08-30 DIAGNOSIS — J441 Chronic obstructive pulmonary disease with (acute) exacerbation: Secondary | ICD-10-CM | POA: Diagnosis not present

## 2016-08-30 DIAGNOSIS — I4891 Unspecified atrial fibrillation: Secondary | ICD-10-CM | POA: Diagnosis not present

## 2016-08-30 DIAGNOSIS — I5033 Acute on chronic diastolic (congestive) heart failure: Secondary | ICD-10-CM

## 2016-08-30 NOTE — Progress Notes (Signed)
Location:   [place] Room Number: 159/P Place of Service:  SNF (31)  Provider: Latwan Luchsinger,[place]  PCP: Tawni Carnes, PA-C Patient Care Team: [name] as PCP - General (Physician Assistant) Daneil Dolin, MD (Gastroenterology)  Extended Emergency Contact Information Primary Emergency Contact: [name] [place] of [place] Phone: [phone] Relation: Daughter Secondary Emergency Contact: [name], [place] [address] [name] of [place] Phone: [phone] Relation: Son  Code Status: DNR Goals of care:  Advanced Directive information Advanced Directives 08/30/2016  Does Patient Have a Medical Advance Directive? Yes  Type of Advance Directive Out of facility DNR (pink MOST or yellow form)  Does patient want to make changes to medical advance directive? No - Patient declined  Copy of [place] in Chart? -  Would patient like information on creating a medical advance directive? No - Patient declined  Pre-existing out of facility DNR order (yellow form or pink MOST form) -     Allergies  Allergen Reactions  . Cephalosporins Diarrhea and Nausea Only    Lightheaded  . Levaquin [Levofloxacin In D5w] Swelling  . Macrodantin [Nitrofurantoin Macrocrystal] Swelling  . Phenothiazines Anaphylaxis and Hives  . Polysorbate Anaphylaxis  . Prednisone Shortness Of Breath  . Buspirone Itching  . Cardura [Doxazosin Mesylate] Itching  . Codeine Itching  . Acyclovir And Related Itching    Redness of skin  . Prochlorperazine Other (See Comments)    "Upset stomach"  . Ranexa [Ranolazine]     Severe drop in BP  . Atorvastatin Hives    Cramping; tolerates Crestor ok  . Ofloxacin Rash  . Other Itching and Rash    "WOOL"= make skin look like it has been burned  . Penicillins Other (See Comments)    Causes redness all over. Has patient had a PCN reaction causing immediate rash, facial/tongue/throat  swelling, SOB or lightheadedness with hypotension: No Has patient had a PCN reaction causing severe rash involving mucus membranes or skin necrosis: No Has patient had a PCN reaction that required hospitalization No Has patient had a PCN reaction occurring within the last 10 years: No If all of the above answers are "NO", then may proceed with Cephalosporin use.   . Pimozide Hives and Itching    Chief Complaint  Patient presents with  . Discharge Note    HPI:  83 y.o. female  seen today for discharge from facility likely tomorrow.  She's been here for rehabilitation after undergoing a decompression of the L2-3 with anterior lateral fixation with plate and lateral interbody grafting and fusion with allograft L3-4 and L 4-5  She also has a history of atrial fibrillation chronic CHF with an ejection fraction of 3035%-coronary artery disease status post stenting-COPD GERD.  She has done well with therapy initially when came here had issues with falls with this has improved significantly.  She has gained strength she is now walking with a walker-she will be at home with her daughter who is very supportive she will need continued PT and OT as well as nursing support for multiple issues. She is fairly adamant she wants to go home tomorrow- therapy appears to be comfortable with this  She recently had increased wheezing as well and was seen by Dr. Lyndel Safe lab work did show an elevated BNP-her Lasix was increased slightly by 20 mg a day she is now on 40 mg in the morning 20 mg later in  the day-he appears to have tolerated this fairly well labs have been stable but will need expedient follow-up by primary care provider.  She has gained weight but has lost some weight the last couple weeks I suspect secondary to the increase of Lasix.  She does have a history of COPD and at times will complain of shortness of breath but says this has stabilized still has a small amount of wheezing she does have  nebulizers scheduled for 4 times a day.  Currently she is sitting in her wheelchair comfortably says she feels quite comfortable going home at this point.  Regards to atrial fibrillation this appears rate controlled on Toprol at times she is somewhat tachycardic apparently this is not new she's also on Eliquis for anticoagulation.  She's also been started on iron secondary to anemia thought to have an element of iron deficiency per studies-last hemoglobin was 8.8 on lab done earlier this week on May 14.      Past Medical History:  Diagnosis Date  . Anxiety   . Arthritis   . Atrial fibrillation ([place])   . Bursitis    left shoulder  . Cataract   . CHF (congestive heart failure) (H[facility])    EF 55-60%  . Complication of anesthesia   . COPD (chronic obstructive pulmonary disease) ([place])   . Coronary atherosclerosis of native coronary artery    a. DES to CX, moderately severe stenosis RCA, mild stenosis LAD 04/2013  . Depression   . Dysphagia, unspecified(787.20)   . GERD (gastroesophageal reflux disease)    Hx Schatzki's ring, multiple EGD/ED last 01/06/2004  . Headache   . Heart disease   . Heart murmur    'a littel'  . History of anemia   . HTN (hypertension)    Hx of it  . Hyperlipemia   . Hyperlipidemia   . Internal hemorrhoids without mention of complication   . MI (myocardial infarction) ([facility]) 2006  . Microscopic colitis 2003  . Other and unspecified hyperlipidemia   . Panic disorder without agoraphobia   . Paresthesia    hands, feet  . Pneumonia 12/2011  . PONV (postoperative nausea and vomiting)    'a little nausea"  . PVD (peripheral vascular disease) ([facility] [place])   . S/P colonoscopy 09/27/2001   internal hemorrhoids, desc colon inflam polyp, SB BX-chronic duodenitis, colitis  . Shortness of breath   . Sleep disorder    obstructive  . Thyroid disease    recent abnl TSH per pt    Past Surgical History:  Procedure Laterality Date  . ABDOMINAL HYSTERECTOMY    . ANTERIOR  AND POSTERIOR REPAIR     with resection of vagina  . ANTERIOR LAT LUMBAR FUSION N/A 08/01/2016   Procedure: Lumbar Two-Lumbar Five Transpsoas lateral interbody fusion with Lumbar Two-Three lateral plate fixation;  Surgeon: Kevan Ny Ditty, MD;  Location: [facility];  Service: Neurosurgery;  Laterality: N/A;  L2-5 Transpsoas lateral interbody fusion with L2-3 lateral plate fixation  . APPENDECTOMY    . BACK SURGERY    . BIOPSY  07/05/2015   Procedure: BIOPSY;  Surgeon: Daneil Dolin, MD;  [facility];  Service: Endoscopy;;  gastric polyp biopsy, ascending colon biopsy  . BLADDER SUSPENSION  11/09/2011   Procedure: TRANSVAGINAL TAPE (TVT) PROCEDURE;  Surgeon: Marissa Nestle, MD;  Location: [facility];  Service: Urology;  Laterality: N/A;  . bladder tack  06/2010  . BREAST LUMPECTOMY  1998   left, benign  . CARDIAC CATHETERIZATION    .  CARDIAC CATHETERIZATION    . CARDIAC CATHETERIZATION N/A 12/16/2015   Procedure: Left Heart Cath and Coronary Angiography;  Surgeon: Troy Sine, MD;  Location: [facility];  Service: Cardiovascular;  Laterality: N/A;  . [place]   left  . CHOLECYSTECTOMY  1998  . COLONOSCOPY  03/16/2011   multiple hyperplastic colon polyps, sigmoid diverticulosis, melanosis coli  . COLONOSCOPY WITH PROPOFOL N/A 07/05/2015   RMR:one 5 mm polyp in descending colon  . CORONARY ANGIOPLASTY WITH STENT PLACEMENT    . ESOPHAGEAL DILATION N/A 07/05/2015   Procedure: ESOPHAGEAL DILATION;  Surgeon: Daneil Dolin, MD;  [facility];  Service: Endoscopy;  Laterality: N/A;  . ESOPHAGOGASTRODUODENOSCOPY (EGD) WITH PROPOFOL N/A 07/05/2015   WCB:[id number]  . JOINT REPLACEMENT Right 2007   right knee  . left hand surgery    . LEFT HEART CATHETERIZATION WITH CORONARY ANGIOGRAM N/A 05/14/2013   Procedure: LEFT HEART CATHETERIZATION WITH CORONARY ANGIOGRAM;  Surgeon: Blane Ohara, MD;  Location[facility] LAB;  Service: Cardiovascular;  Laterality:  N/A;  . left rotator cuff surgery    . LUMBAR LAMINECTOMY/DECOMPRESSION MICRODISCECTOMY N/A 10/11/2012   Procedure: LUMBAR LAMINECTOMY/DECOMPRESSION MICRODISCECTOMY 2 LEVELS;  Surgeon: Floyce Stakes, MD;  Location: [facility]UR[facility] ORS;  Service: Neurosurgery;  Laterality: N/A;  L3-4 L4-5 Laminectomy  . LUMBAR WOUND DEBRIDEMENT N/A 09/27/2015   Procedure: Exploration of Lumbar Wound w/ Repair CSF Leak/Lumbar Drain Placement;  Surgeon: Leeroy Cha, MD;  Location: [facility] NEURO ORS;  Service: Neurosurgery;  Laterality: N/A;  . MALONEY DILATION  03/16/2011   Gastritis. No H.pylori on bx. 52F maloney dilation with disruption of  occult cevical esophageal web  . NASAL SINUS SURGERY    . right knee replacement  2007  . right leg benign tumor    . SHOULDER SURGERY Left   . TONSILLECTOMY    . unspecified area, hysterectomy  1972   partial      reports that she quit smoking about 14 years ago. Her smoking use included Cigarettes. She started smoking about 68 years ago. She has a 64.00 pack-year smoking history. She has never used smokeless tobacco. She reports that she does not drink alcohol or use drugs. Social History   Social History  . Marital status: Divorced    Spouse name: N/A  . Number of children: 5  . Years of education: N/A   Occupational History  . retired    Social History Main Topics  . Smoking status: Former Smoker    Packs/day: 1.00    Years: 64.00    Types: Cigarettes    Start date: 12/24/1947    Quit date: 11/17/2001  . Smokeless tobacco: Never Used     Comment: Quit smoking in 2003  . Alcohol use No  . Drug use: No  . Sexual activity: No   Other Topics Concern  . Not on file   Social History Narrative   Divorced.   Sister had colon perforation & died from complications in [place], [place]   Functional Status Survey:    Allergies  Allergen Reactions  . Cephalosporins Diarrhea and Nausea Only    Lightheaded  . Levaquin [Levofloxacin In D5w] Swelling  . Macrodantin  [Nitrofurantoin Macrocrystal] Swelling  . Phenothiazines Anaphylaxis and Hives  . Polysorbate Anaphylaxis  . Prednisone Shortness Of Breath  . Buspirone Itching  . Cardura [Doxazosin Mesylate] Itching  . Codeine Itching  . Acyclovir And Related Itching    Redness of skin  .  Prochlorperazine Other (See Comments)    "Upset stomach"  . Ranexa [Ranolazine]     Severe drop in BP  . Atorvastatin Hives    Cramping; tolerates Crestor ok  . Ofloxacin Rash  . Other Itching and Rash    "WOOL"= make skin look like it has been burned  . Penicillins Other (See Comments)    Causes redness all over. Has patient had a PCN reaction causing immediate rash, facial/tongue/throat swelling, SOB or lightheadedness with hypotension: No Has patient had a PCN reaction causing severe rash involving mucus membranes or skin necrosis: No Has patient had a PCN reaction that required hospitalization No Has patient had a PCN reaction occurring within the last 10 years: No If all of the above answers are "NO", then may proceed with Cephalosporin use.   . Pimozide Hives and Itching    Pertinent  Health Maintenance Due  Topic Date Due  . DEXA SCAN  10/14/2016 (Originally 10/23/1999)  . PNA vac Low Risk Adult (2 of 2 - PCV13) 10/15/2016  . INFLUENZA VACCINE  11/15/2016    Medications: Outpatient Encounter Prescriptions as of 08/30/2016  Medication Sig  . acetaminophen (TYLENOL) 325 MG tablet Take 650 mg by mouth every 4 (four) hours as needed.  [name] Kitchen acetaminophen (TYLENOL) 500 MG tablet Take 500 mg by mouth daily as needed for headache.   [name] Kitchen apixaban (ELIQUIS) 5 MG TABS tablet Take 1 tablet (5 mg total) by mouth 2 (two) times daily.  . bisacodyl (DULCOLAX) 5 MG EC tablet Take 1 tablet (5 mg total) by mouth daily as needed for moderate constipation.  . DULoxetine (CYMBALTA) 60 MG capsule Take 60 mg by mouth at bedtime.   . ferrous sulfate (KP FERROUS SULFATE) 325 (65 FE) MG tablet Take 325 mg by mouth daily with  breakfast.  . fluticasone (FLONASE) 50 MCG/ACT nasal spray Place 2 sprays into both nostrils daily.   . furosemide (LASIX) 20 MG tablet Take 20 mg by mouth every evening.  . furosemide (LASIX) 40 MG tablet Take 40 mg by mouth daily.  [name] Kitchen gabapentin (NEURONTIN) 300 MG capsule Take 1 capsule (300 mg total) by mouth 3 (three) times daily.  . hydrocortisone (ANUSOL-HC) 25 MG suppository Place 1 suppository (25 mg total) rectally every 12 (twelve) hours.  [name] Kitchen ipratropium-albuterol (DUONEB) 0.5-2.5 (3) MG/3ML SOLN Take 3 mLs by nebulization every 6 (six) hours.  . Magnesium 400 MG TABS Take 400 mg by mouth daily.  . methocarbamol (ROBAXIN) 500 MG tablet Take 500 mg by mouth 2 (two) times daily as needed for muscle spasms.  . metoprolol succinate (TOPROL-XL) 50 MG 24 hr tablet Take 1 1/2 tablets (75 mg ) daily  . Multiple Vitamin (MULTIVITAMIN WITH MINERALS) TABS tablet Take 1 tablet by mouth daily with lunch. Centrum  . nitroGLYCERIN (NITROSTAT) 0.4 MG SL tablet Place 0.4 mg under the tongue every 5 (five) minutes as needed for chest pain. Reported on 08/04/2015  . oxyCODONE (ROXICODONE) 5 MG immediate release tablet Take one tablet by mouth twice daily as needed for pain  . pantoprazole (PROTONIX) 40 MG tablet Take 40 mg by mouth daily.   . potassium chloride SA (K-DUR,KLOR-CON) 20 MEQ tablet Take 20 mEq by mouth daily.   . [DISCONTINUED] furosemide (LASIX) 40 MG tablet TAKE 40 MG TWICE A DAY FOR 3 DAYS, THEN TAKE 40 MG DAILY FROM THEN ON   No facility-administered encounter medications on file as of 08/30/2016.      Review of Systems  In general she  is not complaining of any fever or chills.  Skin does not complain of rashes or itching.  Head ears eyes nose mouth and throat does not complain of sore throat or visual changes.  Respiratory still complains at times of small amount of shortness of breath and wheezing this is this has improved does not really complain of cough.  Cardiac does not  complaining of chest pain edema appears to be improved.  GI is not complaining of any nausea vomiting diarrhea constipation or abdominal discomfort.  GU is not complaining of dysuria.  Muscle skeletal continues on OxyIR twice a day when necessary for pain apparently this is effective she is not really complaining of back pain today.  Neurologic is not complaining of dizziness headache or numbness does have a history of neuropathy.  Psych is not complaining of altered anxiety or depression does continue on Cymbalta with some history of depression also has a history of anxiety but this has not really been an issue to my knowledge during her stay here   Vitals:   08/30/16 1035  BP: 126/82  Pulse: 88  Resp: (!) 22  Temp: 97.8 F (36.6 C)  TempSrc: Oral   Weight is 153.6 and weight on May 3 was 157.4.  Admission weight was 148.2 pounds Physical Exam In general this is a pleasant elderly female in no distress sitting comfortably in her wheelchair-she has a back brace on  Her skin is warm and dry   Oropharynx clear mucous membranes moist.  Chest there is no labored breathing somewhat shallow air entry she has a minimal amount of expiratory wheezing anterior fields  Heart is regular irregular rate and rhythm without murmur gallop around she does not really have significant lower extremity edema I would say this is trace   Abdomen is soft nontender with positive bowel sounds somewhat limited exam since back brace is applied.  Musculoskeletal is able to move all her extremities 4 extremities. Grip strength appears to be intact is able to move her lower extremities bilaterally Now is ambulating with a walker.  She does have a back brace applied   Neurologic   Touch  sensation appears to be intact legs bilaterally thighs bilaterally again strength appears to be intact as noted above  Cranial nerves are grossly intact her speech is clear.  Psych she is alert and oriented  pleasant and appropriate Labs reviewed: Basic Metabolic Panel:  Recent Labs  04/27/16 1432  06/12/16 1444  08/14/16 0930 08/22/16 0700 08/28/16 0700  NA  --   --   --   < > 133* 137 135  K  --   --   --   < > 4.1 4.0 4.0  CL  --   --   --   < > 94* 98* 96*  CO2  --   --   --   < > 29 30 29   GLUCOSE  --   --   --   < > 110* 120* 109*  BUN  --   --   --   < > 24* 18 16  CREATININE  --   < >  --   < > 1.16* 1.12* 1.14*  CALCIUM  --   --   --   < > 8.9 8.9 9.0  MG 1.6*  --  1.8  --   --  2.0  --   < > = values in this interval not displayed. Liver Function Tests:  Recent Labs  02/16/16 1627 03/20/16 1552 04/15/16 1423  AST 23 17 21   ALT 13* 14 15  ALKPHOS 66 94 118  BILITOT 0.5 0.6 1.0  PROT 7.5 7.3 7.8  ALBUMIN 3.8 3.8 3.6   No results for input(s): LIPASE, AMYLASE in the last 8760 hours. No results for input(s): AMMONIA in the last 8760 hours. CBC:  Recent Labs  08/07/16 0700 08/15/16 0700 08/22/16 0700 08/28/16 0700  WBC 6.6 7.9 7.3 9.1  NEUTROABS 3.6 4.6 3.8  --   HGB 9.2* 8.4* 8.3* 8.8*  HCT 28.7* 26.6* 26.4* 28.3*  MCV 79.1 81.1 81.5 82.3  PLT 361 449* 404* 378   Cardiac Enzymes:  Recent Labs  02/17/16 0158 02/17/16 0755 03/20/16 1552  TROPONINI <0.03 <0.03 <0.03   BNP: Invalid input(s): POCBNP CBG: No results for input(s): GLUCAP in the last 8760 hours.  Procedures and Imaging Studies During Stay: Dg Lumbar Spine 2-3 Views  Result Date: 08/01/2016 CLINICAL DATA:  83 y/o F; intraoperative fluoroscopy of spinal fusion. EXAM: DG C-ARM 61-120 MIN; LUMBAR SPINE - 2-3 VIEW COMPARISON:  09/24/2015 lumbar fluoroscopy. FINDINGS: Fluoro time of 1 minutes and 27 seconds. Posterior and interbody fusion of L3 through L5. Partially visualized lateral plate fixation of D6-6. IMPRESSION: Intraoperative fluoroscopy of lumbar fusion. Electronically Signed   By: Kristine Garbe M.D.   On: 08/01/2016 14:43   Dg C-arm 61-120 Min  Result Date:  08/01/2016 CLINICAL DATA:  83 y/o F; intraoperative fluoroscopy of spinal fusion. EXAM: DG C-ARM 61-120 MIN; LUMBAR SPINE - 2-3 VIEW COMPARISON:  09/24/2015 lumbar fluoroscopy. FINDINGS: Fluoro time of 1 minutes and 27 seconds. Posterior and interbody fusion of L3 through L5. Partially visualized lateral plate fixation of Y4-0. IMPRESSION: Intraoperative fluoroscopy of lumbar fusion. Electronically Signed   By: Kristine Garbe M.D.   On: 08/01/2016 14:43    Assessment/Plan:   :   #1 status post lumbar spine surgery she appears to be doing relatively well here she will need follow-up by the surgeon-clinically she appears to be getting stronger OxyIR appears effective for rate through pain she is also on Robaxin when necessary.  She is now ambulatory with walker and would benefit from PT and OT she has all her DME at home-she will be at home with her daughter who is quite supportive.  #2 history of atrial fibrillation this appears fairly well rate controlled I got a pulse on auscultation in the 90s today she had times is slightly tachycardic apparently but this is not new she is on Eliquis for anticoagulation as well as Toprol XL for rate control.  #3 history of coronary artery disease she is status post at this appears stable on medical management she does have an order for nitroglycerin as needed but to my knowledge has not really used this.  #4 history of congestive heart failure as noted above her Lasix has been increased slightly now on 40 mg in the morning 20 mg later in the day she is also on potassium supplementation-will update a metabolic panel tomorrow to insure stability here she appears to have tolerated this well her weight is actually down about 5 pounds I suspect this may be due to the Lasix.  #5-history COPD this appears relatively stable she does have when necessary nebulizers her breathing appears to be at baseline per what she tells me today follow-up by primary care  provider as warranted.  #6 history of anemia iron has been started secondary to a show on iron deficiency-will update CBC tomorrow  as well last hemoglobin was 8.8 which is relatively stable.  #7 history of neuropathy she continues on Neurontin apparently numbness has improved.  #8 history of GERD she continues on Protonix this has not really been an issue during her stay here to my knowledge as well as.  Again will update a CBC with differential and metabolic panel to ensure stability before discharge with her history of anemia as well as recent diuretic changes.   will need follow up by primary care provider as well as follow-up by her surgeon but she appears to be doing relatively well.  She will be home with her daughter will get PT OT as well as nursing support for multiple medical issues at home  [phone] note greater than 30 minutes spent on this discharge summary-greater than 50% of time spent coordinating plan of care for numerous diagnoses

## 2016-08-31 ENCOUNTER — Encounter (HOSPITAL_COMMUNITY)
Admission: RE | Admit: 2016-08-31 | Discharge: 2016-08-31 | Disposition: A | Discharge status: Home / Self Care | Payer: Medicare Other | Source: Skilled Nursing Facility | Attending: Internal Medicine | Admitting: Internal Medicine

## 2016-08-31 DIAGNOSIS — G9009 Other idiopathic peripheral autonomic neuropathy: Secondary | ICD-10-CM | POA: Insufficient documentation

## 2016-08-31 DIAGNOSIS — I5032 Chronic diastolic (congestive) heart failure: Secondary | ICD-10-CM | POA: Insufficient documentation

## 2016-08-31 DIAGNOSIS — J3089 Other allergic rhinitis: Secondary | ICD-10-CM | POA: Insufficient documentation

## 2016-08-31 DIAGNOSIS — M6281 Muscle weakness (generalized): Secondary | ICD-10-CM | POA: Insufficient documentation

## 2016-08-31 DIAGNOSIS — Z48811 Encounter for surgical aftercare following surgery on the nervous system: Secondary | ICD-10-CM | POA: Insufficient documentation

## 2016-08-31 DIAGNOSIS — R262 Difficulty in walking, not elsewhere classified: Secondary | ICD-10-CM | POA: Insufficient documentation

## 2016-08-31 LAB — BASIC METABOLIC PANEL
Anion gap: 11 (ref 5–15)
BUN: 17 mg/dL (ref 6–20)
CO2: 30 mmol/L (ref 22–32)
Calcium: 9.1 mg/dL (ref 8.9–10.3)
Chloride: 94 mmol/L — ABNORMAL LOW (ref 101–111)
Creatinine, Ser: 1.22 mg/dL — ABNORMAL HIGH (ref 0.44–1.00)
GFR calc Af Amer: 47 mL/min — ABNORMAL LOW (ref >60)
GFR calc non Af Amer: 40 mL/min — ABNORMAL LOW (ref >60)
GLUCOSE: 116 mg/dL — AB (ref 65–99)
POTASSIUM: 3.7 mmol/L (ref 3.5–5.1)
SODIUM: 135 mmol/L (ref 135–145)

## 2016-08-31 LAB — CBC WITH DIFFERENTIAL/PLATELET
Basophils Absolute: 0.1 10*3/uL (ref 0.0–0.1)
Basophils Relative: 1 %
EOS ABS: 0.7 10*3/uL (ref 0.0–0.7)
Eosinophils Relative: 8 %
HCT: 28.8 % — ABNORMAL LOW (ref 36.0–46.0)
HEMOGLOBIN: 9.1 g/dL — AB (ref 12.0–15.0)
LYMPHS ABS: 2.5 10*3/uL (ref 0.7–4.0)
LYMPHS PCT: 29 %
MCH: 25.9 pg — AB (ref 26.0–34.0)
MCHC: 31.6 g/dL (ref 30.0–36.0)
MCV: 82.1 fL (ref 78.0–100.0)
MONOS PCT: 8 %
Monocytes Absolute: 0.7 10*3/uL (ref 0.1–1.0)
Neutro Abs: 4.8 10*3/uL (ref 1.7–7.7)
Neutrophils Relative %: 54 %
Platelets: 352 10*3/uL (ref 150–400)
RBC: 3.51 MIL/uL — ABNORMAL LOW (ref 3.87–5.11)
RDW: 18.7 % — ABNORMAL HIGH (ref 11.5–15.5)
WBC: 8.7 10*3/uL (ref 4.0–10.5)

## 2016-09-02 DIAGNOSIS — I739 Peripheral vascular disease, unspecified: Secondary | ICD-10-CM | POA: Diagnosis not present

## 2016-09-02 DIAGNOSIS — M4317 Spondylolisthesis, lumbosacral region: Secondary | ICD-10-CM | POA: Diagnosis not present

## 2016-09-02 DIAGNOSIS — I2510 Atherosclerotic heart disease of native coronary artery without angina pectoris: Secondary | ICD-10-CM | POA: Diagnosis not present

## 2016-09-02 DIAGNOSIS — I252 Old myocardial infarction: Secondary | ICD-10-CM | POA: Diagnosis not present

## 2016-09-02 DIAGNOSIS — Z981 Arthrodesis status: Secondary | ICD-10-CM | POA: Diagnosis not present

## 2016-09-02 DIAGNOSIS — I482 Chronic atrial fibrillation: Secondary | ICD-10-CM | POA: Diagnosis not present

## 2016-09-02 DIAGNOSIS — Z955 Presence of coronary angioplasty implant and graft: Secondary | ICD-10-CM | POA: Diagnosis not present

## 2016-09-02 DIAGNOSIS — Z7901 Long term (current) use of anticoagulants: Secondary | ICD-10-CM | POA: Diagnosis not present

## 2016-09-02 DIAGNOSIS — I509 Heart failure, unspecified: Secondary | ICD-10-CM | POA: Diagnosis not present

## 2016-09-02 DIAGNOSIS — Z4789 Encounter for other orthopedic aftercare: Secondary | ICD-10-CM | POA: Diagnosis not present

## 2016-09-02 DIAGNOSIS — I110 Hypertensive heart disease with heart failure: Secondary | ICD-10-CM | POA: Diagnosis not present

## 2016-09-02 DIAGNOSIS — J449 Chronic obstructive pulmonary disease, unspecified: Secondary | ICD-10-CM | POA: Diagnosis not present

## 2016-09-02 DIAGNOSIS — Z87891 Personal history of nicotine dependence: Secondary | ICD-10-CM | POA: Diagnosis not present

## 2016-09-02 DIAGNOSIS — Z96651 Presence of right artificial knee joint: Secondary | ICD-10-CM | POA: Diagnosis not present

## 2016-09-04 DIAGNOSIS — J449 Chronic obstructive pulmonary disease, unspecified: Secondary | ICD-10-CM | POA: Diagnosis not present

## 2016-09-04 DIAGNOSIS — Z4789 Encounter for other orthopedic aftercare: Secondary | ICD-10-CM | POA: Diagnosis not present

## 2016-09-04 DIAGNOSIS — F419 Anxiety disorder, unspecified: Secondary | ICD-10-CM | POA: Diagnosis not present

## 2016-09-04 DIAGNOSIS — J440 Chronic obstructive pulmonary disease with acute lower respiratory infection: Secondary | ICD-10-CM | POA: Diagnosis not present

## 2016-09-04 DIAGNOSIS — I5033 Acute on chronic diastolic (congestive) heart failure: Secondary | ICD-10-CM | POA: Diagnosis not present

## 2016-09-04 DIAGNOSIS — I110 Hypertensive heart disease with heart failure: Secondary | ICD-10-CM | POA: Diagnosis not present

## 2016-09-04 DIAGNOSIS — D649 Anemia, unspecified: Secondary | ICD-10-CM | POA: Diagnosis not present

## 2016-09-04 DIAGNOSIS — I2510 Atherosclerotic heart disease of native coronary artery without angina pectoris: Secondary | ICD-10-CM | POA: Diagnosis not present

## 2016-09-04 DIAGNOSIS — Z6828 Body mass index (BMI) 28.0-28.9, adult: Secondary | ICD-10-CM | POA: Diagnosis not present

## 2016-09-04 DIAGNOSIS — I482 Chronic atrial fibrillation: Secondary | ICD-10-CM | POA: Diagnosis not present

## 2016-09-04 DIAGNOSIS — M4317 Spondylolisthesis, lumbosacral region: Secondary | ICD-10-CM | POA: Diagnosis not present

## 2016-09-04 DIAGNOSIS — M545 Low back pain: Secondary | ICD-10-CM | POA: Diagnosis not present

## 2016-09-04 DIAGNOSIS — I252 Old myocardial infarction: Secondary | ICD-10-CM | POA: Diagnosis not present

## 2016-09-05 DIAGNOSIS — M4317 Spondylolisthesis, lumbosacral region: Secondary | ICD-10-CM | POA: Diagnosis not present

## 2016-09-05 DIAGNOSIS — Z4789 Encounter for other orthopedic aftercare: Secondary | ICD-10-CM | POA: Diagnosis not present

## 2016-09-05 DIAGNOSIS — J449 Chronic obstructive pulmonary disease, unspecified: Secondary | ICD-10-CM | POA: Diagnosis not present

## 2016-09-05 DIAGNOSIS — I252 Old myocardial infarction: Secondary | ICD-10-CM | POA: Diagnosis not present

## 2016-09-05 DIAGNOSIS — I2510 Atherosclerotic heart disease of native coronary artery without angina pectoris: Secondary | ICD-10-CM | POA: Diagnosis not present

## 2016-09-05 DIAGNOSIS — I110 Hypertensive heart disease with heart failure: Secondary | ICD-10-CM | POA: Diagnosis not present

## 2016-09-06 DIAGNOSIS — I110 Hypertensive heart disease with heart failure: Secondary | ICD-10-CM | POA: Diagnosis not present

## 2016-09-06 DIAGNOSIS — M4317 Spondylolisthesis, lumbosacral region: Secondary | ICD-10-CM | POA: Diagnosis not present

## 2016-09-06 DIAGNOSIS — I2510 Atherosclerotic heart disease of native coronary artery without angina pectoris: Secondary | ICD-10-CM | POA: Diagnosis not present

## 2016-09-06 DIAGNOSIS — I252 Old myocardial infarction: Secondary | ICD-10-CM | POA: Diagnosis not present

## 2016-09-06 DIAGNOSIS — J449 Chronic obstructive pulmonary disease, unspecified: Secondary | ICD-10-CM | POA: Diagnosis not present

## 2016-09-06 DIAGNOSIS — Z4789 Encounter for other orthopedic aftercare: Secondary | ICD-10-CM | POA: Diagnosis not present

## 2016-09-07 DIAGNOSIS — M4317 Spondylolisthesis, lumbosacral region: Secondary | ICD-10-CM | POA: Diagnosis not present

## 2016-09-07 DIAGNOSIS — J449 Chronic obstructive pulmonary disease, unspecified: Secondary | ICD-10-CM | POA: Diagnosis not present

## 2016-09-07 DIAGNOSIS — Z4789 Encounter for other orthopedic aftercare: Secondary | ICD-10-CM | POA: Diagnosis not present

## 2016-09-07 DIAGNOSIS — I252 Old myocardial infarction: Secondary | ICD-10-CM | POA: Diagnosis not present

## 2016-09-07 DIAGNOSIS — I110 Hypertensive heart disease with heart failure: Secondary | ICD-10-CM | POA: Diagnosis not present

## 2016-09-07 DIAGNOSIS — I2510 Atherosclerotic heart disease of native coronary artery without angina pectoris: Secondary | ICD-10-CM | POA: Diagnosis not present

## 2016-09-08 DIAGNOSIS — J449 Chronic obstructive pulmonary disease, unspecified: Secondary | ICD-10-CM | POA: Diagnosis not present

## 2016-09-08 DIAGNOSIS — M4317 Spondylolisthesis, lumbosacral region: Secondary | ICD-10-CM | POA: Diagnosis not present

## 2016-09-08 DIAGNOSIS — Z4789 Encounter for other orthopedic aftercare: Secondary | ICD-10-CM | POA: Diagnosis not present

## 2016-09-08 DIAGNOSIS — I110 Hypertensive heart disease with heart failure: Secondary | ICD-10-CM | POA: Diagnosis not present

## 2016-09-08 DIAGNOSIS — I2510 Atherosclerotic heart disease of native coronary artery without angina pectoris: Secondary | ICD-10-CM | POA: Diagnosis not present

## 2016-09-08 DIAGNOSIS — I252 Old myocardial infarction: Secondary | ICD-10-CM | POA: Diagnosis not present

## 2016-09-12 DIAGNOSIS — I252 Old myocardial infarction: Secondary | ICD-10-CM | POA: Diagnosis not present

## 2016-09-12 DIAGNOSIS — M4317 Spondylolisthesis, lumbosacral region: Secondary | ICD-10-CM | POA: Diagnosis not present

## 2016-09-12 DIAGNOSIS — I2510 Atherosclerotic heart disease of native coronary artery without angina pectoris: Secondary | ICD-10-CM | POA: Diagnosis not present

## 2016-09-12 DIAGNOSIS — J449 Chronic obstructive pulmonary disease, unspecified: Secondary | ICD-10-CM | POA: Diagnosis not present

## 2016-09-12 DIAGNOSIS — I110 Hypertensive heart disease with heart failure: Secondary | ICD-10-CM | POA: Diagnosis not present

## 2016-09-12 DIAGNOSIS — Z4789 Encounter for other orthopedic aftercare: Secondary | ICD-10-CM | POA: Diagnosis not present

## 2016-09-13 DIAGNOSIS — I252 Old myocardial infarction: Secondary | ICD-10-CM | POA: Diagnosis not present

## 2016-09-13 DIAGNOSIS — I2510 Atherosclerotic heart disease of native coronary artery without angina pectoris: Secondary | ICD-10-CM | POA: Diagnosis not present

## 2016-09-13 DIAGNOSIS — M4317 Spondylolisthesis, lumbosacral region: Secondary | ICD-10-CM | POA: Diagnosis not present

## 2016-09-13 DIAGNOSIS — I110 Hypertensive heart disease with heart failure: Secondary | ICD-10-CM | POA: Diagnosis not present

## 2016-09-13 DIAGNOSIS — J449 Chronic obstructive pulmonary disease, unspecified: Secondary | ICD-10-CM | POA: Diagnosis not present

## 2016-09-13 DIAGNOSIS — Z4789 Encounter for other orthopedic aftercare: Secondary | ICD-10-CM | POA: Diagnosis not present

## 2016-09-14 DIAGNOSIS — I2510 Atherosclerotic heart disease of native coronary artery without angina pectoris: Secondary | ICD-10-CM | POA: Diagnosis not present

## 2016-09-14 DIAGNOSIS — I252 Old myocardial infarction: Secondary | ICD-10-CM | POA: Diagnosis not present

## 2016-09-14 DIAGNOSIS — I110 Hypertensive heart disease with heart failure: Secondary | ICD-10-CM | POA: Diagnosis not present

## 2016-09-14 DIAGNOSIS — J449 Chronic obstructive pulmonary disease, unspecified: Secondary | ICD-10-CM | POA: Diagnosis not present

## 2016-09-14 DIAGNOSIS — Z4789 Encounter for other orthopedic aftercare: Secondary | ICD-10-CM | POA: Diagnosis not present

## 2016-09-14 DIAGNOSIS — M4317 Spondylolisthesis, lumbosacral region: Secondary | ICD-10-CM | POA: Diagnosis not present

## 2016-09-15 DIAGNOSIS — I252 Old myocardial infarction: Secondary | ICD-10-CM | POA: Diagnosis not present

## 2016-09-15 DIAGNOSIS — M4317 Spondylolisthesis, lumbosacral region: Secondary | ICD-10-CM | POA: Diagnosis not present

## 2016-09-15 DIAGNOSIS — J449 Chronic obstructive pulmonary disease, unspecified: Secondary | ICD-10-CM | POA: Diagnosis not present

## 2016-09-15 DIAGNOSIS — I2510 Atherosclerotic heart disease of native coronary artery without angina pectoris: Secondary | ICD-10-CM | POA: Diagnosis not present

## 2016-09-15 DIAGNOSIS — I110 Hypertensive heart disease with heart failure: Secondary | ICD-10-CM | POA: Diagnosis not present

## 2016-09-15 DIAGNOSIS — Z4789 Encounter for other orthopedic aftercare: Secondary | ICD-10-CM | POA: Diagnosis not present

## 2016-09-18 DIAGNOSIS — M4317 Spondylolisthesis, lumbosacral region: Secondary | ICD-10-CM | POA: Diagnosis not present

## 2016-09-18 DIAGNOSIS — I110 Hypertensive heart disease with heart failure: Secondary | ICD-10-CM | POA: Diagnosis not present

## 2016-09-18 DIAGNOSIS — Z4789 Encounter for other orthopedic aftercare: Secondary | ICD-10-CM | POA: Diagnosis not present

## 2016-09-18 DIAGNOSIS — I252 Old myocardial infarction: Secondary | ICD-10-CM | POA: Diagnosis not present

## 2016-09-18 DIAGNOSIS — I2510 Atherosclerotic heart disease of native coronary artery without angina pectoris: Secondary | ICD-10-CM | POA: Diagnosis not present

## 2016-09-18 DIAGNOSIS — J449 Chronic obstructive pulmonary disease, unspecified: Secondary | ICD-10-CM | POA: Diagnosis not present

## 2016-09-20 DIAGNOSIS — M4317 Spondylolisthesis, lumbosacral region: Secondary | ICD-10-CM | POA: Diagnosis not present

## 2016-09-20 DIAGNOSIS — I2510 Atherosclerotic heart disease of native coronary artery without angina pectoris: Secondary | ICD-10-CM | POA: Diagnosis not present

## 2016-09-20 DIAGNOSIS — Z4789 Encounter for other orthopedic aftercare: Secondary | ICD-10-CM | POA: Diagnosis not present

## 2016-09-20 DIAGNOSIS — I252 Old myocardial infarction: Secondary | ICD-10-CM | POA: Diagnosis not present

## 2016-09-20 DIAGNOSIS — J449 Chronic obstructive pulmonary disease, unspecified: Secondary | ICD-10-CM | POA: Diagnosis not present

## 2016-09-20 DIAGNOSIS — I110 Hypertensive heart disease with heart failure: Secondary | ICD-10-CM | POA: Diagnosis not present

## 2016-09-21 DIAGNOSIS — I252 Old myocardial infarction: Secondary | ICD-10-CM | POA: Diagnosis not present

## 2016-09-21 DIAGNOSIS — M4317 Spondylolisthesis, lumbosacral region: Secondary | ICD-10-CM | POA: Diagnosis not present

## 2016-09-21 DIAGNOSIS — I110 Hypertensive heart disease with heart failure: Secondary | ICD-10-CM | POA: Diagnosis not present

## 2016-09-21 DIAGNOSIS — J449 Chronic obstructive pulmonary disease, unspecified: Secondary | ICD-10-CM | POA: Diagnosis not present

## 2016-09-21 DIAGNOSIS — I2510 Atherosclerotic heart disease of native coronary artery without angina pectoris: Secondary | ICD-10-CM | POA: Diagnosis not present

## 2016-09-21 DIAGNOSIS — Z4789 Encounter for other orthopedic aftercare: Secondary | ICD-10-CM | POA: Diagnosis not present

## 2016-09-22 ENCOUNTER — Ambulatory Visit (INDEPENDENT_AMBULATORY_CARE_PROVIDER_SITE_OTHER): Payer: Medicare Other | Admitting: Cardiovascular Disease

## 2016-09-22 ENCOUNTER — Encounter: Payer: Self-pay | Admitting: Cardiovascular Disease

## 2016-09-22 VITALS — BP 120/64 | HR 100 | Ht 61.0 in | Wt 150.0 lb

## 2016-09-22 DIAGNOSIS — I4891 Unspecified atrial fibrillation: Secondary | ICD-10-CM

## 2016-09-22 DIAGNOSIS — I25118 Atherosclerotic heart disease of native coronary artery with other forms of angina pectoris: Secondary | ICD-10-CM | POA: Diagnosis not present

## 2016-09-22 DIAGNOSIS — I5022 Chronic systolic (congestive) heart failure: Secondary | ICD-10-CM | POA: Diagnosis not present

## 2016-09-22 DIAGNOSIS — I10 Essential (primary) hypertension: Secondary | ICD-10-CM

## 2016-09-22 DIAGNOSIS — I2510 Atherosclerotic heart disease of native coronary artery without angina pectoris: Secondary | ICD-10-CM | POA: Diagnosis not present

## 2016-09-22 DIAGNOSIS — I209 Angina pectoris, unspecified: Secondary | ICD-10-CM

## 2016-09-22 DIAGNOSIS — E7800 Pure hypercholesterolemia, unspecified: Secondary | ICD-10-CM

## 2016-09-22 DIAGNOSIS — Z955 Presence of coronary angioplasty implant and graft: Secondary | ICD-10-CM | POA: Diagnosis not present

## 2016-09-22 MED ORDER — METOPROLOL SUCCINATE ER 100 MG PO TB24: 100.0000 mg | ORAL_TABLET | Freq: Every day | ORAL | 3 refills | Status: DC

## 2016-09-22 NOTE — Progress Notes (Signed)
SUBJECTIVE: The patient presents for routine follow-up. She underwent decompression of L2-L3 with anterior lateral fixation with plate and lateral interbody grafting and fusion with allograft L3-L4 and L4-L5 earlier this year.  She has chronic systolic heart failure and atrial fibrillation. Echocardiogram on 04/17/16 demonstrated moderately reduced left ventricle systolic function, LVEF 79-02%.  She underwent percutaneous coronary intervention with drug-eluting stent to the left circumflex coronary artery in January 2015. Coronary angiography 12/16/15 showed 20% stenosis in the proximal LAD and in the inferior branch of the first diagonal with a widely patent previously placed circumflex stent. RCA was small and nondominant with 80% ostial stenosis. Medical therapy was recommended.  She has chronic exertional dyspnea due to COPD. She thinks it may be slightly worse. She normally wears a brace to support her back but is not wearing it now. She has been having episodic chest pain and pressure while at rest.  Nuclear stress test was normal on 10/07/15.   Review of Systems: As per "subjective", otherwise negative.  Allergies  Allergen Reactions  . Cephalosporins Diarrhea and Nausea Only    Lightheaded  . Levaquin [Levofloxacin In D5w] Swelling  . Macrodantin [Nitrofurantoin Macrocrystal] Swelling  . Phenothiazines Anaphylaxis and Hives  . Polysorbate Anaphylaxis  . Prednisone Shortness Of Breath  . Buspirone Itching  . Cardura [Doxazosin Mesylate] Itching  . Codeine Itching  . Acyclovir And Related Itching    Redness of skin  . Prochlorperazine Other (See Comments)    "Upset stomach"  . Ranexa [Ranolazine]     Severe drop in BP  . Atorvastatin Hives    Cramping; tolerates Crestor ok  . Ofloxacin Rash  . Other Itching and Rash    "WOOL"= make skin look like it has been burned  . Penicillins Other (See Comments)    Causes redness all over. Has patient had a PCN reaction causing  immediate rash, facial/tongue/throat swelling, SOB or lightheadedness with hypotension: No Has patient had a PCN reaction causing severe rash involving mucus membranes or skin necrosis: No Has patient had a PCN reaction that required hospitalization No Has patient had a PCN reaction occurring within the last 10 years: No If all of the above answers are "NO", then may proceed with Cephalosporin use.   . Pimozide Hives and Itching    Current Outpatient Prescriptions  Medication Sig Dispense Refill  . acetaminophen (TYLENOL) 325 MG tablet Take 650 mg by mouth every 4 (four) hours as needed.    [name] Kitchen acetaminophen (TYLENOL) 500 MG tablet Take 500 mg by mouth daily as needed for headache.     [name] Kitchen apixaban (ELIQUIS) 5 MG TABS tablet Take 1 tablet (5 mg total) by mouth 2 (two) times daily. 60 tablet 6  . bisacodyl (DULCOLAX) 5 MG EC tablet Take 1 tablet (5 mg total) by mouth daily as needed for moderate constipation. 30 tablet 0  . DULoxetine (CYMBALTA) 60 MG capsule Take 60 mg by mouth at bedtime.     . ferrous sulfate (KP FERROUS SULFATE) 325 (65 FE) MG tablet Take 325 mg by mouth daily with breakfast.    . fluticasone (FLONASE) 50 MCG/ACT nasal spray Place 2 sprays into both nostrils daily.     . furosemide (LASIX) 20 MG tablet Take 20 mg by mouth every evening.    . furosemide (LASIX) 40 MG tablet Take 40 mg by mouth daily.    [name] Kitchen gabapentin (NEURONTIN) 300 MG capsule Take 1 capsule (300 mg total) by mouth 3 (three) times  daily. 90 capsule 2  . hydrocortisone (ANUSOL-HC) 25 MG suppository Place 1 suppository (25 mg total) rectally every 12 (twelve) hours. 12 suppository 1  . ipratropium-albuterol (DUONEB) 0.5-2.5 (3) MG/3ML SOLN Take 3 mLs by nebulization every 6 (six) hours. 360 mL 0  . Magnesium 400 MG TABS Take 400 mg by mouth daily. 90 tablet 3  . methocarbamol (ROBAXIN) 500 MG tablet Take 500 mg by mouth 2 (two) times daily as needed for muscle spasms.    . metoprolol succinate (TOPROL-XL) 50 MG  24 hr tablet Take 1 1/2 tablets (75 mg ) daily 135 tablet 3  . Multiple Vitamin (MULTIVITAMIN WITH MINERALS) TABS tablet Take 1 tablet by mouth daily with lunch. Centrum    . nitroGLYCERIN (NITROSTAT) 0.4 MG SL tablet Place 0.4 mg under the tongue every 5 (five) minutes as needed for chest pain. Reported on 08/04/2015    . oxyCODONE (ROXICODONE) 5 MG immediate release tablet Take one tablet by mouth twice daily as needed for pain 60 tablet 0  . pantoprazole (PROTONIX) 40 MG tablet Take 40 mg by mouth daily.     . potassium chloride SA (K-DUR,KLOR-CON) 20 MEQ tablet Take 20 mEq by mouth daily.      No current facility-administered medications for this visit.     Past Medical History:  Diagnosis Date  . Anxiety   . Arthritis   . Atrial fibrillation ([place])   . Bursitis    left shoulder  . Cataract   . CHF (congestive heart failure) ([facility])    EF 55-60%  . Complication of anesthesia   . COPD (chronic obstructive pulmonary disease) ([place])   . Coronary atherosclerosis of native coronary artery    a. DES to CX, moderately severe stenosis RCA, mild stenosis LAD 04/2013  . Depression   . Dysphagia, unspecified(787.20)   . GERD (gastroesophageal reflux disease)    Hx Schatzki's ring, multiple EGD/ED last 01/06/2004  . Headache   . Heart disease   . Heart murmur    'a littel'  . History of anemia   . HTN (hypertension)    Hx of it  . Hyperlipemia   . Hyperlipidemia   . Internal hemorrhoids without mention of complication   . MI (myocardial infarction) ([place]) 2006  . Microscopic colitis 2003  . Other and unspecified hyperlipidemia   . Panic disorder without agoraphobia   . Paresthesia    hands, feet  . Pneumonia 12/2011  . PONV (postoperative nausea and vomiting)    'a little nausea"  . PVD (peripheral vascular disease) ([place])   . S/P colonoscopy 09/27/2001   internal hemorrhoids, desc colon inflam polyp, SB BX-chronic duodenitis, colitis  . Shortness of breath   . Sleep disorder     obstructive  . Thyroid disease    recent abnl TSH per pt    Past Surgical History:  Procedure Laterality Date  . ABDOMINAL HYSTERECTOMY    . ANTERIOR AND POSTERIOR REPAIR     with resection of vagina  . ANTERIOR LAT LUMBAR FUSION N/A 08/01/2016   Procedure: Lumbar Two-Lumbar Five Transpsoas lateral interbody fusion with Lumbar Two-Three lateral plate fixation;  Surgeon: Kevan Ny Ditty, MD;  Location: [facility];  Service: Neurosurgery;  Laterality: N/A;  L2-5 Transpsoas lateral interbody fusion with L2-3 lateral plate fixation  . APPENDECTOMY    . BACK SURGERY    . BIOPSY  07/05/2015   Procedure: BIOPSY;  Surgeon: Daneil Dolin, MD;  Location: [facility];  Service: Endoscopy;;  gastric polyp biopsy, ascending colon biopsy  . BLADDER SUSPENSION  11/09/2011   Procedure: TRANSVAGINAL TAPE (TVT) PROCEDURE;  Surgeon: Marissa Nestle, MD;  Location: [facility];  Service: Urology;  Laterality: N/A;  . bladder tack  06/2010  . BREAST LUMPECTOMY  1998   left, benign  . CARDIAC CATHETERIZATION    . CARDIAC CATHETERIZATION    . CARDIAC CATHETERIZATION N/A 12/16/2015   Procedure: Left Heart Cath and Coronary Angiography;  Surgeon: Troy Sine, MD;  Location: [facility];  Service: Cardiovascular;  Laterality: N/A;  . [place]   left  . CHOLECYSTECTOMY  1998  . COLONOSCOPY  03/16/2011   multiple hyperplastic colon polyps, sigmoid diverticulosis, melanosis coli  . COLONOSCOPY WITH PROPOFOL N/A 07/05/2015   RMR:one 5 mm polyp in descending colon  . CORONARY ANGIOPLASTY WITH STENT PLACEMENT    . ESOPHAGEAL DILATION N/A 07/05/2015   Procedure: ESOPHAGEAL DILATION;  Surgeon: Daneil Dolin, MD;  [facility];  Service: Endoscopy;  Laterality: N/A;  . ESOPHAGOGASTRODUODENOSCOPY (EGD) WITH PROPOFOL N/A 07/05/2015   BOF:[facility]  . JOINT REPLACEMENT Right 2007   right knee  . left hand surgery    . LEFT HEART CATHETERIZATION WITH CORONARY ANGIOGRAM N/A 05/14/2013    Procedure: LEFT HEART CATHETERIZATION WITH CORONARY ANGIOGRAM;  Surgeon: Blane Ohara, MD;  Location[facility] C[facility] LAB;  Service: Cardiovascular;  Laterality: N/A;  . left rotator cuff surgery    . LUMBAR LAMINECTOMY/DECOMPRESSION MICRODISCECTOMY N/A 10/11/2012   Procedure: LUMBAR LAMINECTOMY/DECOMPRESSION MICRODISCECTOMY 2 LEVELS;  Surgeon: Floyce Stakes, MD;  Location: [facility];  Service: Neurosurgery;  Laterality: N/A;  L3-4 L4-5 Laminectomy  . LUMBAR WOUND DEBRIDEMENT N/A 09/27/2015   Procedure: Exploration of Lumbar Wound w/ Repair CSF Leak/Lumbar Drain Placement;  Surgeon: Leeroy Cha, MD;  Location: [facility] OR[facility];  Service: Neurosurgery;  Laterality: N/A;  . MALONEY DILATION  03/16/2011   Gastritis. No H.pylori on bx. 18F maloney dilation with disruption of  occult cevical esophageal web  . NASAL SINUS SURGERY    . right knee replacement  2007  . right leg benign tumor    . SHOULDER SURGERY Left   . TONSILLECTOMY    . unspecified area, hysterectomy  1972   partial    Social History   Social History  . Marital status: Divorced    Spouse name: N/A  . Number of children: 5  . Years of education: N/A   Occupational History  . retired    Social History Main Topics  . Smoking status: Former Smoker    Packs/day: 1.00    Years: 64.00    Types: Cigarettes    Start date: 12/24/1947    Quit date: 11/17/2001  . Smokeless tobacco: Never Used     Comment: Quit smoking in 2003  . Alcohol use No  . Drug use: No  . Sexual activity: No   Other Topics Concern  . Not on file   Social History Narrative   Divorced.   Sister had colon perforation & died from complications in [place], [place]     Vitals:   09/22/16 1305  BP: 120/64  Pulse: 100  SpO2: 97%  Weight: 150 lb (68 kg)  Height: 5\' 1"  (1.549 m)    Wt Readings from Last 3 Encounters:  09/22/16 150 lb (68 kg)  08/01/16 147 lb (66.7 kg)  07/24/16 147 lb 1 oz (66.7 kg)     PHYSICAL EXAM General: NAD HEENT:  Normal. Neck: No JVD, no thyromegaly. Lungs: Clear to auscultation bilaterally with normal respiratory effort. CV: Nondisplaced PMI.  Tachycardic, irregular rhythm, normal S1/S2, no S3, no murmur. No pretibial or periankle edema.    Abdomen: Soft, nontender, no distention.  Neurologic: Alert and oriented.  Psych: Normal affect. Skin: Normal. Musculoskeletal: No gross deformities.    ECG: Most recent ECG reviewed.   Labs: Lab Results  Component Value Date/Time   K 3.7 08/31/2016 06:15 AM   BUN 17 08/31/2016 06:15 AM   CREATININE 1.22 (H) 08/31/2016 06:15 AM   CREATININE 1.17 (H) 02/13/2011 03:26 PM   ALT 15 04/15/2016 02:23 PM   TSH 3.000 04/15/2016 06:14 AM   TSH 2.770 11/25/2013 02:14 PM   HGB 9.1 (L) 08/31/2016 06:15 AM     Lipids: Lab Results  Component Value Date/Time   LDLCALC 103 (H) 12/16/2015 04:35 AM   CHOL 177 12/16/2015 04:35 AM   TRIG 197 (H) 12/16/2015 04:35 AM   HDL 35 (L) 12/16/2015 04:35 AM       ASSESSMENT AND PLAN:  1. CAD with DES to LCx with chest pressure: I will increase Toprol-XL to 100 mg daily. Nuclear stress test was normal in June 2017. Given small nondominant RCA, medical therapy is warranted. Statin intolerant. She is no longer on aspirin.  2. Essential HTN: Controlled. Monitor given increased dose of metoprolol.  3. Chronic systolic heart failure, EF 30-35% (previously 55%): Euvolemic. Takes Lasix 40 mg every morning and 20 g every evening. I will increase Toprol-XL to 100 mg daily for heart rate control and alleviation of angina. GFR 40 cc/min 08/31/16. Will hold off on ACEI/ARB. Possibly tachycardia-mediated.   4. Hyperlipidemia: Statin intolerant.  5. Aortic regurgitation: Will monitor clinically and with surveillance echocardiograms. Mild at present.  6. Atrial fibrillation: Tachycardic today.I will increase Toprol-XL to 100 mg daily. Continue Eliquis.    Disposition: Follow up 2 months  Kate Sable, M.D.,  F.A.C.C.

## 2016-09-22 NOTE — Patient Instructions (Signed)
Your physician recommends that you schedule a follow-up appointment in: 2 months with M Lenze PA-C   INCREASE Toprol XL to 100 mg daily      No testing or lab work today     Thank you for choosing [facility] !

## 2016-09-25 DIAGNOSIS — I252 Old myocardial infarction: Secondary | ICD-10-CM | POA: Diagnosis not present

## 2016-09-25 DIAGNOSIS — J449 Chronic obstructive pulmonary disease, unspecified: Secondary | ICD-10-CM | POA: Diagnosis not present

## 2016-09-25 DIAGNOSIS — I2510 Atherosclerotic heart disease of native coronary artery without angina pectoris: Secondary | ICD-10-CM | POA: Diagnosis not present

## 2016-09-25 DIAGNOSIS — I110 Hypertensive heart disease with heart failure: Secondary | ICD-10-CM | POA: Diagnosis not present

## 2016-09-25 DIAGNOSIS — Z4789 Encounter for other orthopedic aftercare: Secondary | ICD-10-CM | POA: Diagnosis not present

## 2016-09-25 DIAGNOSIS — M4317 Spondylolisthesis, lumbosacral region: Secondary | ICD-10-CM | POA: Diagnosis not present

## 2016-09-27 DIAGNOSIS — J449 Chronic obstructive pulmonary disease, unspecified: Secondary | ICD-10-CM | POA: Diagnosis not present

## 2016-09-27 DIAGNOSIS — Z4789 Encounter for other orthopedic aftercare: Secondary | ICD-10-CM | POA: Diagnosis not present

## 2016-09-27 DIAGNOSIS — I252 Old myocardial infarction: Secondary | ICD-10-CM | POA: Diagnosis not present

## 2016-09-27 DIAGNOSIS — M4317 Spondylolisthesis, lumbosacral region: Secondary | ICD-10-CM | POA: Diagnosis not present

## 2016-09-27 DIAGNOSIS — I110 Hypertensive heart disease with heart failure: Secondary | ICD-10-CM | POA: Diagnosis not present

## 2016-09-27 DIAGNOSIS — I2510 Atherosclerotic heart disease of native coronary artery without angina pectoris: Secondary | ICD-10-CM | POA: Diagnosis not present

## 2016-09-30 ENCOUNTER — Encounter (HOSPITAL_COMMUNITY): Payer: Self-pay | Admitting: Emergency Medicine

## 2016-09-30 ENCOUNTER — Emergency Department (HOSPITAL_COMMUNITY): Payer: Medicare Other

## 2016-09-30 ENCOUNTER — Observation Stay (HOSPITAL_COMMUNITY)
Admission: EM | Admit: 2016-09-30 | Discharge: 2016-10-01 | Disposition: A | Discharge status: Home / Self Care | Payer: Medicare Other | Attending: Internal Medicine | Admitting: Internal Medicine

## 2016-09-30 DIAGNOSIS — I208 Other forms of angina pectoris: Secondary | ICD-10-CM

## 2016-09-30 DIAGNOSIS — J440 Chronic obstructive pulmonary disease with acute lower respiratory infection: Secondary | ICD-10-CM | POA: Diagnosis not present

## 2016-09-30 DIAGNOSIS — R0602 Shortness of breath: Secondary | ICD-10-CM | POA: Diagnosis present

## 2016-09-30 DIAGNOSIS — M545 Low back pain: Secondary | ICD-10-CM | POA: Diagnosis not present

## 2016-09-30 DIAGNOSIS — R079 Chest pain, unspecified: Secondary | ICD-10-CM | POA: Diagnosis present

## 2016-09-30 DIAGNOSIS — J449 Chronic obstructive pulmonary disease, unspecified: Secondary | ICD-10-CM | POA: Diagnosis not present

## 2016-09-30 DIAGNOSIS — I481 Persistent atrial fibrillation: Secondary | ICD-10-CM | POA: Diagnosis not present

## 2016-09-30 DIAGNOSIS — Z79899 Other long term (current) drug therapy: Secondary | ICD-10-CM | POA: Insufficient documentation

## 2016-09-30 DIAGNOSIS — Z6828 Body mass index (BMI) 28.0-28.9, adult: Secondary | ICD-10-CM | POA: Diagnosis not present

## 2016-09-30 DIAGNOSIS — I4819 Other persistent atrial fibrillation: Secondary | ICD-10-CM

## 2016-09-30 DIAGNOSIS — I482 Chronic atrial fibrillation: Secondary | ICD-10-CM | POA: Diagnosis not present

## 2016-09-30 DIAGNOSIS — I2510 Atherosclerotic heart disease of native coronary artery without angina pectoris: Secondary | ICD-10-CM | POA: Diagnosis present

## 2016-09-30 DIAGNOSIS — I5033 Acute on chronic diastolic (congestive) heart failure: Secondary | ICD-10-CM | POA: Insufficient documentation

## 2016-09-30 DIAGNOSIS — I130 Hypertensive heart and chronic kidney disease with heart failure and stage 1 through stage 4 chronic kidney disease, or unspecified chronic kidney disease: Secondary | ICD-10-CM | POA: Insufficient documentation

## 2016-09-30 DIAGNOSIS — I209 Angina pectoris, unspecified: Secondary | ICD-10-CM | POA: Diagnosis not present

## 2016-09-30 DIAGNOSIS — D649 Anemia, unspecified: Secondary | ICD-10-CM | POA: Diagnosis not present

## 2016-09-30 DIAGNOSIS — I25118 Atherosclerotic heart disease of native coronary artery with other forms of angina pectoris: Secondary | ICD-10-CM | POA: Insufficient documentation

## 2016-09-30 DIAGNOSIS — Z87891 Personal history of nicotine dependence: Secondary | ICD-10-CM | POA: Diagnosis not present

## 2016-09-30 DIAGNOSIS — K648 Other hemorrhoids: Secondary | ICD-10-CM | POA: Diagnosis present

## 2016-09-30 DIAGNOSIS — N183 Chronic kidney disease, stage 3 (moderate): Secondary | ICD-10-CM | POA: Insufficient documentation

## 2016-09-30 DIAGNOSIS — F419 Anxiety disorder, unspecified: Secondary | ICD-10-CM | POA: Diagnosis not present

## 2016-09-30 DIAGNOSIS — I5032 Chronic diastolic (congestive) heart failure: Secondary | ICD-10-CM | POA: Diagnosis present

## 2016-09-30 LAB — CBC WITH DIFFERENTIAL/PLATELET
Basophils Absolute: 0.1 10*3/uL (ref 0.0–0.1)
Basophils Relative: 1 %
Eosinophils Absolute: 0.2 10*3/uL (ref 0.0–0.7)
Eosinophils Relative: 2 %
HEMATOCRIT: 31 % — AB (ref 36.0–46.0)
HEMOGLOBIN: 9.8 g/dL — AB (ref 12.0–15.0)
LYMPHS ABS: 2.7 10*3/uL (ref 0.7–4.0)
LYMPHS PCT: 28 %
MCH: 25.7 pg — AB (ref 26.0–34.0)
MCHC: 31.6 g/dL (ref 30.0–36.0)
MCV: 81.2 fL (ref 78.0–100.0)
Monocytes Absolute: 0.6 10*3/uL (ref 0.1–1.0)
Monocytes Relative: 6 %
NEUTROS ABS: 6 10*3/uL (ref 1.7–7.7)
NEUTROS PCT: 63 %
Platelets: 363 10*3/uL (ref 150–400)
RBC: 3.82 MIL/uL — AB (ref 3.87–5.11)
RDW: 18 % — ABNORMAL HIGH (ref 11.5–15.5)
WBC: 9.5 10*3/uL (ref 4.0–10.5)

## 2016-09-30 LAB — BASIC METABOLIC PANEL
Anion gap: 9 (ref 5–15)
BUN: 12 mg/dL (ref 6–20)
CHLORIDE: 98 mmol/L — AB (ref 101–111)
CO2: 28 mmol/L (ref 22–32)
CREATININE: 1.3 mg/dL — AB (ref 0.44–1.00)
Calcium: 9.1 mg/dL (ref 8.9–10.3)
GFR calc non Af Amer: 37 mL/min — ABNORMAL LOW (ref >60)
GFR, EST AFRICAN AMERICAN: 43 mL/min — AB (ref >60)
Glucose, Bld: 108 mg/dL — ABNORMAL HIGH (ref 65–99)
Potassium: 4.2 mmol/L (ref 3.5–5.1)
Sodium: 135 mmol/L (ref 135–145)

## 2016-09-30 LAB — URINALYSIS, ROUTINE W REFLEX MICROSCOPIC
BACTERIA UA: NONE SEEN
BILIRUBIN URINE: NEGATIVE
Glucose, UA: NEGATIVE mg/dL
Hgb urine dipstick: NEGATIVE
KETONES UR: NEGATIVE mg/dL
NITRITE: NEGATIVE
Protein, ur: NEGATIVE mg/dL
Specific Gravity, Urine: 1.004 — ABNORMAL LOW (ref 1.005–1.030)
Squamous Epithelial / LPF: NONE SEEN
pH: 7 (ref 5.0–8.0)

## 2016-09-30 LAB — CBG MONITORING, ED: Glucose-Capillary: 121 mg/dL — ABNORMAL HIGH (ref 65–99)

## 2016-09-30 LAB — PROTIME-INR
INR: 1.56
Prothrombin Time: 18.8 seconds — ABNORMAL HIGH (ref 11.4–15.2)

## 2016-09-30 LAB — TROPONIN I: Troponin I: <0.03 ng/mL (ref <0.03)

## 2016-09-30 MED ORDER — IPRATROPIUM-ALBUTEROL 0.5-2.5 (3) MG/3ML IN SOLN
3.0000 mL | Freq: Four times a day (QID) | RESPIRATORY_TRACT | Status: DC
  Administered 2016-09-30: 3 mL via RESPIRATORY_TRACT
  Filled 2016-09-30: qty 3

## 2016-09-30 MED ORDER — ASPIRIN 81 MG PO CHEW
324.0000 mg | CHEWABLE_TABLET | Freq: Once | ORAL | Status: AC
  Administered 2016-09-30: 324 mg via ORAL
  Filled 2016-09-30: qty 4

## 2016-09-30 MED ORDER — ASPIRIN EC 325 MG PO TBEC
325.0000 mg | DELAYED_RELEASE_TABLET | Freq: Every day | ORAL | Status: DC
  Administered 2016-10-01: 325 mg via ORAL
  Filled 2016-09-30: qty 1

## 2016-09-30 MED ORDER — ONDANSETRON HCL 4 MG/2ML IJ SOLN
4.0000 mg | Freq: Four times a day (QID) | INTRAMUSCULAR | Status: DC | PRN
Start: 2016-09-30 — End: 2016-10-01

## 2016-09-30 MED ORDER — POTASSIUM CHLORIDE CRYS ER 20 MEQ PO TBCR
20.0000 meq | EXTENDED_RELEASE_TABLET | Freq: Every day | ORAL | Status: DC
  Administered 2016-10-01: 20 meq via ORAL
  Filled 2016-09-30: qty 1

## 2016-09-30 MED ORDER — ALPRAZOLAM 0.25 MG PO TABS
0.2500 mg | ORAL_TABLET | Freq: Two times a day (BID) | ORAL | Status: DC | PRN
  Administered 2016-09-30: 0.25 mg via ORAL
  Filled 2016-09-30: qty 1

## 2016-09-30 MED ORDER — APIXABAN 5 MG PO TABS
5.0000 mg | ORAL_TABLET | Freq: Two times a day (BID) | ORAL | Status: DC
  Administered 2016-09-30 – 2016-10-01 (×2): 5 mg via ORAL
  Filled 2016-09-30 (×2): qty 1

## 2016-09-30 MED ORDER — PANTOPRAZOLE SODIUM 40 MG PO TBEC
40.0000 mg | DELAYED_RELEASE_TABLET | Freq: Every day | ORAL | Status: DC
  Administered 2016-10-01: 40 mg via ORAL
  Filled 2016-09-30: qty 1

## 2016-09-30 MED ORDER — DULOXETINE HCL 60 MG PO CPEP
60.0000 mg | ORAL_CAPSULE | Freq: Every day | ORAL | Status: DC
  Administered 2016-09-30: 60 mg via ORAL
  Filled 2016-09-30: qty 1

## 2016-09-30 MED ORDER — BISACODYL 5 MG PO TBEC: 5.0000 mg | DELAYED_RELEASE_TABLET | Freq: Every day | ORAL | Status: DC | PRN

## 2016-09-30 MED ORDER — METOPROLOL SUCCINATE ER 50 MG PO TB24
100.0000 mg | ORAL_TABLET | Freq: Every day | ORAL | Status: DC
  Administered 2016-10-01: 100 mg via ORAL
  Filled 2016-09-30: qty 2

## 2016-09-30 MED ORDER — MAGNESIUM 400 MG PO TABS
400.0000 mg | ORAL_TABLET | Freq: Every day | ORAL | Status: DC
  Filled 2016-09-30 (×3): qty 1

## 2016-09-30 MED ORDER — GI COCKTAIL ~~LOC~~: 30.0000 mL | Freq: Four times a day (QID) | ORAL | Status: DC | PRN

## 2016-09-30 MED ORDER — METHOCARBAMOL 500 MG PO TABS
500.0000 mg | ORAL_TABLET | Freq: Two times a day (BID) | ORAL | Status: DC | PRN
  Administered 2016-09-30: 500 mg via ORAL
  Filled 2016-09-30: qty 1

## 2016-09-30 MED ORDER — IOPAMIDOL (ISOVUE-370) INJECTION 76%
80.0000 mL | Freq: Once | INTRAVENOUS | Status: AC | PRN
  Administered 2016-09-30: 80 mL via INTRAVENOUS

## 2016-09-30 MED ORDER — FUROSEMIDE 40 MG PO TABS
40.0000 mg | ORAL_TABLET | Freq: Every day | ORAL | Status: DC
  Administered 2016-10-01: 40 mg via ORAL
  Filled 2016-09-30: qty 1

## 2016-09-30 MED ORDER — ACETAMINOPHEN 325 MG PO TABS: 650.0000 mg | ORAL_TABLET | ORAL | Status: DC | PRN

## 2016-09-30 MED ORDER — HYDROCORTISONE ACETATE 25 MG RE SUPP
25.0000 mg | Freq: Two times a day (BID) | RECTAL | Status: DC
  Filled 2016-09-30 (×2): qty 1

## 2016-09-30 MED ORDER — NITROGLYCERIN 0.4 MG SL SUBL: 0.4000 mg | SUBLINGUAL_TABLET | SUBLINGUAL | Status: DC | PRN

## 2016-09-30 MED ORDER — ACETAMINOPHEN 500 MG PO TABS
1000.0000 mg | ORAL_TABLET | Freq: Once | ORAL | Status: AC
  Administered 2016-09-30: 1000 mg via ORAL
  Filled 2016-09-30: qty 2

## 2016-09-30 MED ORDER — GABAPENTIN 300 MG PO CAPS
300.0000 mg | ORAL_CAPSULE | Freq: Three times a day (TID) | ORAL | Status: DC
  Administered 2016-09-30 – 2016-10-01 (×2): 300 mg via ORAL
  Filled 2016-09-30 (×2): qty 1

## 2016-09-30 MED ORDER — IPRATROPIUM-ALBUTEROL 0.5-2.5 (3) MG/3ML IN SOLN
3.0000 mL | Freq: Four times a day (QID) | RESPIRATORY_TRACT | Status: DC
  Administered 2016-10-01: 3 mL via RESPIRATORY_TRACT
  Filled 2016-09-30: qty 3

## 2016-09-30 MED ORDER — FLUTICASONE PROPIONATE 50 MCG/ACT NA SUSP
2.0000 | Freq: Every day | NASAL | Status: DC
  Filled 2016-09-30: qty 16

## 2016-09-30 MED ORDER — ADULT MULTIVITAMIN W/MINERALS CH: 1.0000 | ORAL_TABLET | Freq: Every day | ORAL | Status: DC

## 2016-09-30 MED ORDER — FUROSEMIDE 20 MG PO TABS: 20.0000 mg | ORAL_TABLET | Freq: Every evening | ORAL | Status: DC

## 2016-09-30 MED ORDER — NITROGLYCERIN 0.4 MG SL SUBL
0.4000 mg | SUBLINGUAL_TABLET | SUBLINGUAL | Status: DC | PRN
  Administered 2016-09-30: 0.4 mg via SUBLINGUAL
  Filled 2016-09-30: qty 1

## 2016-09-30 NOTE — ED Provider Notes (Signed)
[facility] DEPT Provider Note   CSN: [id number] Arrival date & time: 09/30/16  1143     History   Chief Complaint Chief Complaint  Patient presents with  . Chest Pain    HPI [name] is a 83 y.o. female.  Patient with history of vascular disease, high blood pressure, heart attack, hyperlipidemia, heart failure, COPD, atrial fibrillation on Xarelto presents with shortness of breath and anterior chest pressure worsening exertion. Patient noticed since yesterday. Patient's had this in the past however this is more significant. Patient does follow with local cardiologist. Patient denies recent surgery or unilateral leg swelling. Patient also has back pain which is lower lumbar region. Patient had lumbar surgery in April.      Past Medical History:  Diagnosis Date  . Anxiety   . Arthritis   . Atrial fibrillation ([place])   . Bursitis    left shoulder  . Cataract   . CHF (congestive heart failure) (HCC)    EF 55-60%  . Complication of anesthesia   . COPD (chronic obstructive pulmonary disease) ([place])   . Coronary atherosclerosis of native coronary artery    a. DES to CX, moderately severe stenosis RCA, mild stenosis LAD 04/2013  . Depression   . Dysphagia, unspecified(787.20)   . GERD (gastroesophageal reflux disease)    Hx Schatzki's ring, multiple EGD/ED last 01/06/2004  . Headache   . Heart disease   . Heart murmur    'a littel'  . History of anemia   . HTN (hypertension)    Hx of it  . Hyperlipemia   . Hyperlipidemia   . Internal hemorrhoids without mention of complication   . MI (myocardial infarction) ([place]) 2006  . Microscopic colitis 2003  . Other and unspecified hyperlipidemia   . Panic disorder without agoraphobia   . Paresthesia    hands, feet  . Pneumonia 12/2011  . PONV (postoperative nausea and vomiting)    'a little nausea"  . PVD (peripheral vascular disease) ([place])   . S/P colonoscopy 09/27/2001   internal hemorrhoids, desc colon inflam polyp,  SB BX-chronic duodenitis, colitis  . Shortness of breath   . Sleep disorder    obstructive  . Thyroid disease    recent abnl TSH per pt    Patient Active Problem List   Diagnosis Date Noted  . Status post lumbar spine surgery for decompression of spinal cord 08/07/2016  . Lumbosacral spondylosis with radiculopathy 08/01/2016  . Preoperative clearance 07/05/2016  . Cough 05/26/2016  . Weakness 04/26/2016  . Nausea without vomiting 04/26/2016  . Atrial fibrillation with RVR ([place]) 04/15/2016  . SOB (shortness of breath) 03/20/2016  . Hypoxia   . Acute on chronic diastolic congestive heart failure ([place])   . Chronic kidney disease, stage III (moderate) 02/17/2016  . Essential hypertension 02/17/2016  . COPD exacerbation ([place]) 02/17/2016  . Acute bronchitis 12/18/2015  . CAD in native artery   . Chest pain at rest 10/15/2015  . Chronic low back pain 10/15/2015  . Hyponatremia 10/15/2015  . Hyperglycemia 10/15/2015  . Thrombocytosis ([place]) 10/15/2015  . Atypical chest pain 10/15/2015  . Depression   . Anxiety   . Gastroesophageal reflux disease without esophagitis   . Mild cognitive impairment 10/14/2015  . Iron deficiency anemia due to chronic blood loss   . Meningitis 10/01/2015  . Coronary artery disease due to lipid rich plaque   . NSVT (nonsustained ventricular tachycardia) ([place])   . PAF (paroxysmal atrial fibrillation) ([place])   .  CSF leak 09/27/2015  . Spondylolisthesis of lumbar region 09/24/2015  . Gastric polyp   . History of colonic polyps   . Chronic diarrhea   . Esophageal dysphagia 04/01/2015  . PAOD (peripheral arterial occlusive disease) ([place]) 03/05/2015  . Pain in the chest   . Hyperlipidemia   . Weight gain 08/12/2014  . Anemia 08/07/2014  . Hemorrhoids 08/06/2014  . CHF (congestive heart failure) ([place]) 07/29/2014  . Rectal bleeding 07/06/2014  . Constipation 07/06/2014  . Lower extremity edema 11/24/2013  . Acute kidney failure ([place]) 11/24/2013  .  Hypokalemia 11/24/2013  . Other and unspecified angina pectoris 05/14/2013  . Hematochezia 02/13/2011  . Abdominal pain 02/13/2011  . Major depression ([place]) 09/28/2010  . FATIGUE 04/13/2009  . Chronic diastolic heart failure ([place]) 11/30/2008  . DYSPNEA 11/30/2008  . CAD, NATIVE VESSEL - PCI + DES to left circumflex 05/14/13 11/27/2008  . Peripheral vascular disease ([place]) 11/27/2008  . PANIC ATTACK 02/28/2008  . MI 02/28/2008  . Internal hemorrhoids 02/28/2008  . COLITIS 02/28/2008  . Dysphagia 02/28/2008    Past Surgical History:  Procedure Laterality Date  . ABDOMINAL HYSTERECTOMY    . ANTERIOR AND POSTERIOR REPAIR     with resection of vagina  . ANTERIOR LAT LUMBAR FUSION N/A 08/01/2016   Procedure: Lumbar Two-Lumbar Five Transpsoas lateral interbody fusion with Lumbar Two-Three lateral plate fixation;  Surgeon: Kevan Ny Ditty, MD;  Location: [facility];  Service: Neurosurgery;  Laterality: N/A;  L2-5 Transpsoas lateral interbody fusion with L2-3 lateral plate fixation  . APPENDECTOMY    . BACK SURGERY    . BIOPSY  07/05/2015   Procedure: BIOPSY;  Surgeon: Daneil Dolin, MD;  [facility];  Service: Endoscopy;;  gastric polyp biopsy, ascending colon biopsy  . BLADDER SUSPENSION  11/09/2011   Procedure: TRANSVAGINAL TAPE (TVT) PROCEDURE;  Surgeon: Marissa Nestle, MD;  Location: [facility] OR[facility];  Service: Urology;  Laterality: N/A;  . bladder tack  06/2010  . BREAST LUMPECTOMY  1998   left, benign  . CARDIAC CATHETERIZATION    . CARDIAC CATHETERIZATION    . CARDIAC CATHETERIZATION N/A 12/16/2015   Procedure: Left Heart Cath and Coronary Angiography;  Surgeon: [place] Sine, MD;  Location: [facility] CV LAB;  Service: Cardiovascular;  Laterality: N/A;  . [place]   left  . CHOLECYSTECTOMY  1998  . COLONOSCOPY  03/16/2011   multiple hyperplastic colon polyps, sigmoid diverticulosis, melanosis coli  . COLONOSCOPY WITH PROPOFOL N/A 07/05/2015   RMR:one 5 mm  polyp in descending colon  . CORONARY ANGIOPLASTY WITH STENT PLACEMENT    . ESOPHAGEAL DILATION N/A 07/05/2015   Procedure: ESOPHAGEAL DILATION;  Surgeon: Daneil Dolin, MD;  [facility];  Service: Endoscopy;  Laterality: N/A;  . ESOPHAGOGASTRODUODENOSCOPY (EGD) WITH PROPOFOL N/A 07/05/2015   [facility]:ENIDPO  . JOINT REPLACEMENT Right 2007   right knee  . left hand surgery    . LEFT HEART CATHETERIZATION WITH CORONARY ANGIOGRAM N/A 05/14/2013   Procedure: LEFT HEART CATHETERIZATION WITH CORONARY ANGIOGRAM;  Surgeon: Blane Ohara, MD;  Location: [facility] CATH LAB;  Service: Cardiovascular;  Laterality: N/A;  . left rotator cuff surgery    . LUMBAR LAMINECTOMY/DECOMPRESSION MICRODISCECTOMY N/A 10/11/2012   Procedure: LUMBAR LAMINECTOMY/DECOMPRESSION MICRODISCECTOMY 2 LEVELS;  Surgeon: Floyce Stakes, MD;  Location: [facility] NEUR[facility] ORS;  Service: Neurosurgery;  Laterality: N/A;  L3-4 L4-5 Laminectomy  . LUMBAR WOUND DEBRIDEMENT N/A 09/27/2015   Procedure: Exploration of Lumbar Wound  w/ Repair CSF Leak/Lumbar Drain Placement;  Surgeon: Leeroy Cha, MD;  Location: [facility] NEURO ORS;  Service: Neurosurgery;  Laterality: N/A;  . MALONEY DILATION  03/16/2011   Gastritis. No H.pylori on bx. 55F maloney dilation with disruption of  occult cevical esophageal web  . NASAL SINUS SURGERY    . right knee replacement  2007  . right leg benign tumor    . SHOULDER SURGERY Left   . TONSILLECTOMY    . unspecified area, hysterectomy  1972   partial    OB History    Gravida Para Term Preterm AB Living   8 6     2 5    SAB TAB Ectopic Multiple Live Births                   Home Medications    Prior to Admission medications   Medication Sig Start Date End Date Taking? Authorizing Provider  acetaminophen (TYLENOL) 500 MG tablet Take 500 mg by mouth daily as needed for headache.    Yes [provider]  ALPRAZolam (XANAX) 0.25 MG tablet Take 0.25 mg by mouth 2 (two) times daily as needed. 09/04/16   Yes [provider]  apixaban (ELIQUIS) 5 MG TABS tablet Take 1 tablet (5 mg total) by mouth 2 (two) times daily. 03/24/16  Yes Herminio Commons, MD  bisacodyl (DULCOLAX) 5 MG EC tablet Take 1 tablet (5 mg total) by mouth daily as needed for moderate constipation. 04/18/16  Yes Thurnell Lose, MD  DULoxetine (CYMBALTA) 60 MG capsule Take 60 mg by mouth at bedtime.    Yes [provider]  fluticasone (FLONASE) 50 MCG/ACT nasal spray Place 2 sprays into both nostrils daily.    Yes [provider]  furosemide (LASIX) 20 MG tablet Take 20 mg by mouth every evening.   Yes [provider]  furosemide (LASIX) 40 MG tablet Take 40 mg by mouth daily.   Yes [provider]  gabapentin (NEURONTIN) 300 MG capsule Take 1 capsule (300 mg total) by mouth 3 (three) times daily. 10/08/15  Yes Ditty, Kevan Ny, MD  hydrocortisone (ANUSOL-HC) 25 MG suppository Place 1 suppository (25 mg total) rectally every 12 (twelve) hours. 04/26/16  Yes Walden Field A, NP  ipratropium-albuterol (DUONEB) 0.5-2.5 (3) MG/3ML SOLN Take 3 mLs by nebulization every 6 (six) hours. 12/18/15  Yes Regalado, Belk[place] A, MD  Magnesium 400 MG TABS Take 400 mg by mouth daily. 04/28/16  Yes Herminio Commons, MD  methocarbamol (ROBAXIN) 500 MG tablet Take 500 mg by mouth 2 (two) times daily as needed for muscle spasms.   Yes [provider]  metoprolol succinate (TOPROL-XL) 100 MG 24 hr tablet Take 1 tablet (100 mg total) by mouth daily. Take with or immediately following a meal. 09/22/16 12/21/16 Yes Herminio Commons, MD  Multiple Vitamin (MULTIVITAMIN WITH MINERALS) TABS tablet Take 1 tablet by mouth daily with lunch. Centrum   Yes [provider]  nitroGLYCERIN (NITROSTAT) 0.4 MG SL tablet Place 0.4 mg under the tongue every 5 (five) minutes as needed for chest pain. Reported on 08/04/2015   Yes [provider]  oxyCODONE (ROXICODONE) 5 MG immediate release tablet Take  one tablet by mouth twice daily as needed for pain 08/11/16  Yes Reed, Tiffany L, DO  pantoprazole (PROTONIX) 40 MG tablet Take 40 mg by mouth daily.    Yes [provider]  potassium chloride SA (K-DUR,KLOR-CON) 20 MEQ tablet Take 20 mEq by mouth daily.  Yes [provider]    Family History Family History  Problem Relation Age of Onset  . Stroke Mother   . Parkinson's disease Father   . Coronary artery disease Other        family Hx-sons  . Cancer Other   . Stroke Other        family Hx  . Hypertension Other        family Hx  . Diabetes Brother   . Heart disease Son        before age 91  . Diabetes Son   . Stroke Daughter 57    Social History Social History  Substance Use Topics  . Smoking status: Former Smoker    Packs/day: 1.00    Years: 64.00    Types: Cigarettes    Start date: 12/24/1947    Quit date: 11/17/2001  . Smokeless tobacco: Never Used     Comment: Quit smoking in 2003  . Alcohol use No     Allergies   Cephalosporins; Levaquin [levofloxacin in d5w]; Macrodantin [nitrofurantoin macrocrystal]; Phenothiazines; Polysorbate; Prednisone; Buspirone; Cardura [doxazosin mesylate]; Codeine; Acyclovir and related; Prochlorperazine; Ranexa [ranolazine]; Atorvastatin; Ofloxacin; Other; Penicillins; and Pimozide   Review of Systems Review of Systems  Constitutional: Negative for chills and fever.  HENT: Negative for congestion.   Eyes: Negative for visual disturbance.  Respiratory: Positive for shortness of breath.   Cardiovascular: Positive for chest pain. Negative for leg swelling.  Gastrointestinal: Negative for abdominal pain and vomiting.  Genitourinary: Negative for dysuria and flank pain.  Musculoskeletal: Positive for back pain. Negative for neck pain and neck stiffness.  Skin: Negative for rash.  Neurological: Negative for light-headedness and headaches.     Physical Exam Updated Vital Signs BP 105/69   Pulse 76   Temp 97.8 F  (36.6 C) (Oral)   Resp 16   Ht 5\' 1"  (1.549 m)   Wt 68 kg (150 lb)   SpO2 99%   BMI 28.34 kg/m   Physical Exam  Constitutional: She is oriented to person, place, and time. She appears well-developed and well-nourished.  HENT:  Head: Normocephalic and atraumatic.  Eyes: Conjunctivae are normal. Right eye exhibits no discharge. Left eye exhibits no discharge.  Neck: Normal range of motion. Neck supple. No tracheal deviation present.  Cardiovascular: Intact distal pulses.  An irregularly irregular rhythm present. Tachycardia present.   Pulmonary/Chest: Effort normal and breath sounds normal.  Abdominal: Soft. She exhibits no distension. There is no tenderness. There is no guarding.  Musculoskeletal: She exhibits no edema.  Neurological: She is alert and oriented to person, place, and time.  Skin: Skin is warm. No rash noted.  Psychiatric: She has a normal mood and affect.  Nursing note and vitals reviewed.    ED Treatments / Results  Labs (all labs ordered are listed, but only abnormal results are displayed) Labs Reviewed  BASIC METABOLIC PANEL - Abnormal; Notable for the following:       Result Value   Chloride 98 (*)    Glucose, Bld 108 (*)    Creatinine, Ser 1.30 (*)    GFR calc non Af Amer 37 (*)    GFR calc Af Amer 43 (*)    All other components within normal limits  CBC WITH DIFFERENTIAL/PLATELET - Abnormal; Notable for the following:    RBC 3.82 (*)    Hemoglobin 9.8 (*)    HCT 31.0 (*)    MCH 25.7 (*)    RDW 18.0 (*)  All other components within normal limits  PROTIME-INR - Abnormal; Notable for the following:    Prothrombin Time 18.8 (*)    All other components within normal limits  URINALYSIS, ROUTINE W REFLEX MICROSCOPIC - Abnormal; Notable for the following:    Color, Urine STRAW (*)    Specific Gravity, Urine 1.004 (*)    Leukocytes, UA SMALL (*)    All other components within normal limits  CBG MONITORING, ED - Abnormal; Notable for the following:     Glucose-Capillary 121 (*)    All other components within normal limits  TROPONIN I    EKG  EKG Interpretation  Date/Time:  Saturday September 30 2016 11:48:26 EDT Ventricular Rate:  115 PR Interval:    QRS Duration: 75 QT Interval:  306 QTC Calculation: 424 R Axis:   1 Text Interpretation:  Atrial fibrillation Low voltage, precordial leads Borderline repolarization abnormality Confirmed by Reather Converse MD, Amry Cathy [id number]) on 09/30/2016 11:54:10 AM       Radiology Dg Chest 2 View  Result Date: 09/30/2016 CLINICAL DATA:  Chest pain underneath the left breast. Shortness of breath. EXAM: CHEST  2 VIEW COMPARISON:  06/19/2016 and 04/23/2016 FINDINGS: Again noted are coarse lung markings which appear chronic. Lung markings most prominent the right upper lung but minimally changed. Heart size is with normal limits and stable. Atherosclerotic calcifications at the aortic arch. Trachea is midline. No large pleural effusions. No acute bone abnormality. IMPRESSION: Chronic lung changes without acute findings. Electronically Signed   By: Markus Daft M.D.   On: 09/30/2016 13:30    Procedures Procedures (including critical care time)  Medications Ordered in ED Medications  nitroGLYCERIN (NITROSTAT) SL tablet 0.4 mg (0.4 mg Sublingual Given 09/30/16 1314)  aspirin chewable tablet 324 mg (324 mg Oral Given 09/30/16 1213)     Initial Impression / Assessment and Plan / ED Course  I have reviewed the triage vital signs and the nursing notes.  Pertinent labs & imaging results that were available during my care of the patient were reviewed by me and considered in my medical decision making (see chart for details).    Patient presents with exertional chest pain shortness of breath worse than her typical and also mild urinary symptoms. Plan for cardiac workup, CT angina the chest to look for pulmonary embolism with a recent surgery and urinalysis. Urinalysis overall unremarkable plan to send culture.  Nitroglycerin as needed.   CT scan pending. Discussed with hospitalist for admission to telemetry. Troponin neg. Reviewed cardiology notes.  The patients results and plan were reviewed and discussed.   Any x-rays performed were independently reviewed by myself.   Differential diagnosis were considered with the presenting HPI.  Medications  nitroGLYCERIN (NITROSTAT) SL tablet 0.4 mg (0.4 mg Sublingual Given 09/30/16 1314)  aspirin chewable tablet 324 mg (324 mg Oral Given 09/30/16 1213)  iopamidol (ISOVUE-370) 76 % injection 80 mL (80 mLs Intravenous Contrast Given 09/30/16 1416)  acetaminophen (TYLENOL) tablet 1,000 mg (1,000 mg Oral Given 09/30/16 1437)    Vitals:   09/30/16 1330 09/30/16 1355 09/30/16 1400 09/30/16 1500  BP: 105/69 112/71 118/70 107/74  Pulse: 76 (!) 112 (!) 111 (!) 152  Resp: 16 18 14  (!) 22  Temp:      TempSrc:      SpO2: 99% 95% 98% 98%  Weight:      Height:        Final diagnoses:  Angina of effort (HCC)  Persistent atrial fibrillation (HCC)    Admission/  observation were discussed with the admitting physician, patient and/or family and they are comfortable with the plan.    Final Clinical Impressions(s) / ED Diagnoses   Final diagnoses:  Angina of effort ([place])  Persistent atrial fibrillation [facility])    New Prescriptions New Prescriptions   No medications on file     Elnora Morrison, MD 09/30/16 1510

## 2016-09-30 NOTE — ED Triage Notes (Signed)
Pt reports chest pain under left breast for the past 2 days with shortness of breath.  Did not take NTG for pain.  States hurts worse to breathe deeps.

## 2016-09-30 NOTE — ED Notes (Signed)
Report given to 300 at this time.  

## 2016-09-30 NOTE — ED Notes (Signed)
Pt reports burning with urination since Tuesday. Hx of hemorrhoids and had some bleeding from hemorrhoid on Tuesday, but denies new bleeding.

## 2016-09-30 NOTE — H&P (Signed)
History and Physical    [name] UEA:[id number] DOB: 08-08-1932 DOA: 09/30/2016  Referring MD/NP/PA: Elnora Morrison, EDP PCP: Tawni Carnes, PA-C  Patient coming from: Home  Chief Complaint: Chest Pain  HPI: [name] is a 83 y.o. female with h/o CAD, a fib and chronic systolic CHF. Cardiology's notes reviewed: "She underwent percutaneous coronary intervention with drug-eluting stent to the left circumflex coronary artery in January 2015. Coronary angiography 12/16/15 showed 20% stenosis in the proximal LAD and in the inferior branch of the first diagonal with a widely patent previously placed circumflex stent. RCA was small and nondominant with 80% ostial stenosis. Medical therapy was recommended." She comes in today with 2 days of non-exertional CP that is substernal and non-radiating. Nothing makes it better or worse. Initial troponin is negative, EKG shoes a fib with acute ischemic changes, CT angio negative for PE. Admission is requested.  Past Medical/Surgical History: Past Medical History:  Diagnosis Date  . Anxiety   . Arthritis   . Atrial fibrillation ([place])   . Bursitis    left shoulder  . Cataract   . CHF (congestive heart failure) (HCC)    EF 55-60%  . Complication of anesthesia   . COPD (chronic obstructive pulmonary disease) ([place])   . Coronary atherosclerosis of native coronary artery    a. DES to CX, moderately severe stenosis RCA, mild stenosis LAD 04/2013  . Depression   . Dysphagia, unspecified(787.20)   . GERD (gastroesophageal reflux disease)    Hx Schatzki's ring, multiple EGD/ED last 01/06/2004  . Headache   . Heart disease   . Heart murmur    'a littel'  . History of anemia   . HTN (hypertension)    Hx of it  . Hyperlipemia   . Hyperlipidemia   . Internal hemorrhoids without mention of complication   . MI (myocardial infarction) ([place]) 2006  . Microscopic colitis 2003  . Other and unspecified hyperlipidemia   . Panic disorder without  agoraphobia   . Paresthesia    hands, feet  . Pneumonia 12/2011  . PONV (postoperative nausea and vomiting)    'a little nausea"  . PVD (peripheral vascular disease) ([place])   . S/P colonoscopy 09/27/2001   internal hemorrhoids, desc colon inflam polyp, SB BX-chronic duodenitis, colitis  . Shortness of breath   . Sleep disorder    obstructive  . Thyroid disease    recent abnl TSH per pt    Past Surgical History:  Procedure Laterality Date  . ABDOMINAL HYSTERECTOMY    . ANTERIOR AND POSTERIOR REPAIR     with resection of vagina  . ANTERIOR LAT LUMBAR FUSION N/A 08/01/2016   Procedure: Lumbar Two-Lumbar Five Transpsoas lateral interbody fusion with Lumbar Two-Three lateral plate fixation;  Surgeon: Kevan Ny Ditty, MD;  Location: [facility];  Service: Neurosurgery;  Laterality: N/A;  L2-5 Transpsoas lateral interbody fusion with L2-3 lateral plate fixation  . APPENDECTOMY    . BACK SURGERY    . BIOPSY  07/05/2015   Procedure: BIOPSY;  Surgeon: Daneil Dolin, MD;  [facility];  Service: Endoscopy;;  gastric polyp biopsy, ascending colon biopsy  . BLADDER SUSPENSION  11/09/2011   Procedure: TRANSVAGINAL TAPE (TVT) PROCEDURE;  Surgeon: Marissa Nestle, MD;  Location: [facility];  Service: Urology;  Laterality: N/A;  . bladder tack  06/2010  . BREAST LUMPECTOMY  1998   left, benign  . CARDIAC CATHETERIZATION    . CARDIAC CATHETERIZATION    .  CARDIAC CATHETERIZATION N/A 12/16/2015   Procedure: Left Heart Cath and Coronary Angiography;  Surgeon: Troy Sine, MD;  Location: [facility];  Service: Cardiovascular;  Laterality: N/A;  . [place]   left  . CHOLECYSTECTOMY  1998  . COLONOSCOPY  03/16/2011   multiple hyperplastic colon polyps, sigmoid diverticulosis, melanosis coli  . COLONOSCOPY WITH PROPOFOL N/A 07/05/2015   RMR:one 5 mm polyp in descending colon  . CORONARY ANGIOPLASTY WITH STENT PLACEMENT    . ESOPHAGEAL DILATION N/A 07/05/2015    Procedure: ESOPHAGEAL DILATION;  Surgeon: Daneil Dolin, MD;  [facility];  Service: Endoscopy;  Laterality: N/A;  . ESOPHAGOGASTRODUODENOSCOPY (EGD) WITH PROPOFOL N/A 07/05/2015   NWG:NFAOZH  . JOINT REPLACEMENT Right 2007   right knee  . left hand surgery    . LEFT HEART CATHETERIZATION WITH CORONARY ANGIOGRAM N/A 05/14/2013   Procedure: LEFT HEART CATHETERIZATION WITH CORONARY ANGIOGRAM;  Surgeon: Blane Ohara, MD;  Location: [facility] CATH LAB;  Service: Cardiovascular;  Laterality: N/A;  . left rotator cuff surgery    . LUMBAR LAMINECTOMY/DECOMPRESSION MICRODISCECTOMY N/A 10/11/2012   Procedure: LUMBAR LAMINECTOMY/DECOMPRESSION MICRODISCECTOMY 2 LEVELS;  Surgeon: Floyce Stakes, MD;  Location: [place] [facility]UR[facility] OR[facility];  Service: Neurosurgery;  Laterality: N/A;  L3-4 L4-5 Laminectomy  . LUMBAR WOUND DEBRIDEMENT N/A 09/27/2015   Procedure: Exploration of Lumbar Wound w/ Repair CSF Leak/Lumbar Drain Placement;  Surgeon: Leeroy Cha, MD;  Location: [place] [facility]UR[facility] OR[facility];  Service: Neurosurgery;  Laterality: N/A;  . MALONEY DILATION  03/16/2011   Gastritis. No H.pylori on bx. 46F maloney dilation with disruption of  occult cevical esophageal web  . NASAL SINUS SURGERY    . right knee replacement  2007  . right leg benign tumor    . SHOULDER SURGERY Left   . TONSILLECTOMY    . unspecified area, hysterectomy  1972   partial    Social History:  reports that she quit smoking about 14 years ago. Her smoking use included Cigarettes. She started smoking about 68 years ago. She has a 64.00 pack-year smoking history. She has never used smokeless tobacco. She reports that she does not drink alcohol or use drugs.  Allergies: Allergies  Allergen Reactions  . Cephalosporins Diarrhea and Nausea Only    Lightheaded  . Levaquin [Levofloxacin In D5w] Swelling  . Macrodantin [Nitrofurantoin Macrocrystal] Swelling  . Phenothiazines Anaphylaxis and Hives  . Polysorbate Anaphylaxis  . Prednisone Shortness Of  Breath  . Buspirone Itching  . Cardura [Doxazosin Mesylate] Itching  . Codeine Itching  . Acyclovir And Related Itching    Redness of skin  . Prochlorperazine Other (See Comments)    "Upset stomach"  . Ranexa [Ranolazine]     Severe drop in BP  . Atorvastatin Hives    Cramping; tolerates Crestor ok  . Ofloxacin Rash  . Other Itching and Rash    "WOOL"= make skin look like it has been burned  . Penicillins Other (See Comments)    Causes redness all over. Has patient had a PCN reaction causing immediate rash, facial/tongue/throat swelling, SOB or lightheadedness with hypotension: No Has patient had a PCN reaction causing severe rash involving mucus membranes or skin necrosis: No Has patient had a PCN reaction that required hospitalization No Has patient had a PCN reaction occurring within the last 10 years: No If all of the above answers are "NO", then may proceed with Cephalosporin use.   . Pimozide Hives and Itching    Family  History:  Family History  Problem Relation Age of Onset  . Stroke Mother   . Parkinson's disease Father   . Coronary artery disease Other        family Hx-sons  . Cancer Other   . Stroke Other        family Hx  . Hypertension Other        family Hx  . Diabetes Brother   . Heart disease Son        before age 30  . Diabetes Son   . Stroke Daughter 33    Prior to Admission medications   Medication Sig Start Date End Date Taking? Authorizing Provider  acetaminophen (TYLENOL) 500 MG tablet Take 500 mg by mouth daily as needed for headache.    Yes [provider]  ALPRAZolam (XANAX) 0.25 MG tablet Take 0.25 mg by mouth 2 (two) times daily as needed. 09/04/16  Yes [provider]  apixaban (ELIQUIS) 5 MG TABS tablet Take 1 tablet (5 mg total) by mouth 2 (two) times daily. 03/24/16  Yes Herminio Commons, MD  bisacodyl (DULCOLAX) 5 MG EC tablet Take 1 tablet (5 mg total) by mouth daily as needed for moderate constipation. 04/18/16  Yes  Thurnell Lose, MD  DULoxetine (CYMBALTA) 60 MG capsule Take 60 mg by mouth at bedtime.    Yes [provider]  fluticasone (FLONASE) 50 MCG/ACT nasal spray Place 2 sprays into both nostrils daily.    Yes [provider]  furosemide (LASIX) 20 MG tablet Take 20 mg by mouth every evening.   Yes [provider]  furosemide (LASIX) 40 MG tablet Take 40 mg by mouth daily.   Yes [provider]  gabapentin (NEURONTIN) 300 MG capsule Take 1 capsule (300 mg total) by mouth 3 (three) times daily. 10/08/15  Yes Ditty, Kevan Ny, MD  hydrocortisone (ANUSOL-HC) 25 MG suppository Place 1 suppository (25 mg total) rectally every 12 (twelve) hours. 04/26/16  Yes Walden Field A, NP  ipratropium-albuterol (DUONEB) 0.5-2.5 (3) MG/3ML SOLN Take 3 mLs by nebulization every 6 (six) hours. 12/18/15  Yes Regalado, Belkys A, MD  Magnesium 400 MG TABS Take 400 mg by mouth daily. 04/28/16  Yes Herminio Commons, MD  methocarbamol (ROBAXIN) 500 MG tablet Take 500 mg by mouth 2 (two) times daily as needed for muscle spasms.   Yes [provider]  metoprolol succinate (TOPROL-XL) 100 MG 24 hr tablet Take 1 tablet (100 mg total) by mouth daily. Take with or immediately following a meal. 09/22/16 12/21/16 Yes Herminio Commons, MD  Multiple Vitamin (MULTIVITAMIN WITH MINERALS) TABS tablet Take 1 tablet by mouth daily with lunch. Centrum   Yes [provider]  nitroGLYCERIN (NITROSTAT) 0.4 MG SL tablet Place 0.4 mg under the tongue every 5 (five) minutes as needed for chest pain. Reported on 08/04/2015   Yes [provider]  oxyCODONE (ROXICODONE) 5 MG immediate release tablet Take one tablet by mouth twice daily as needed for pain 08/11/16  Yes Reed, Tiffany L, DO  pantoprazole (PROTONIX) 40 MG tablet Take 40 mg by mouth daily.    Yes [provider]  potassium chloride SA (K-DUR,KLOR-CON) 20 MEQ tablet Take 20 mEq by mouth daily.    Yes [provider]    Review of Systems:  Constitutional: Denies fever, chills, diaphoresis, appetite change and fatigue.  HEENT: Denies photophobia, eye pain, redness, hearing loss, ear pain, congestion, sore throat, rhinorrhea, sneezing, mouth sores, trouble swallowing, neck  pain, neck stiffness and tinnitus.   Respiratory: Denies SOB, DOE, cough, and wheezing.   Cardiovascular: Denies  palpitations and leg swelling.  Gastrointestinal: Denies nausea, vomiting, abdominal pain, diarrhea, constipation, blood in stool and abdominal distention.  Genitourinary: Denies dysuria, urgency, frequency, hematuria, flank pain and difficulty urinating.  Endocrine: Denies: hot or cold intolerance, sweats, changes in hair or nails, polyuria, polydipsia. Musculoskeletal: Denies myalgias, back pain, joint swelling, arthralgias and gait problem.  Skin: Denies pallor, rash and wound.  Neurological: Denies dizziness, seizures, syncope, weakness, light-headedness, numbness and headaches.  Hematological: Denies adenopathy. Easy bruising, personal or family bleeding history  Psychiatric/Behavioral: Denies suicidal ideation, mood changes, confusion, nervousness, sleep disturbance and agitation    Physical Exam: Vitals:   09/30/16 1400 09/30/16 1500 09/30/16 1530 09/30/16 1554  BP: 118/70 107/74 130/72 123/67  Pulse: (!) 111 (!) 152 (!) 110 96  Resp: 14 (!) 22 (!) 24 (!) 26  Temp:    97.8 F (36.6 C)  TempSrc:    Oral  SpO2: 98% 98% 95% 99%  Weight:    68.9 kg (151 lb 14.4 oz)  Height:         Constitutional: NAD, calm, comfortable Eyes: PERRL, lids and conjunctivae normal ENMT: Mucous membranes are moist. Posterior pharynx clear of any exudate or lesions.Normal dentition.  Neck: normal, supple, no masses, no thyromegaly Respiratory: clear to auscultation bilaterally, no wheezing, no crackles. Normal respiratory effort. No accessory muscle use.  Cardiovascular: Regular rate and rhythm, no murmurs / rubs / gallops. No  extremity edema. 2+ pedal pulses. No carotid bruits.  Abdomen: no tenderness, no masses palpated. No hepatosplenomegaly. Bowel sounds positive.  Musculoskeletal: no clubbing / cyanosis. No joint deformity upper and lower extremities. Good ROM, no contractures. Normal muscle tone.  Skin: no rashes, lesions, ulcers. No induration Neurologic: CN 2-12 grossly intact. Sensation intact, DTR normal. Strength 5/5 in all 4.  Psychiatric: Normal judgment and insight. Alert and oriented x 3. Normal mood.    Labs on Admission: I have personally reviewed the following labs and imaging studies  CBC:  Recent Labs Lab 09/30/16 1227  WBC 9.5  NEUTROABS 6.0  HGB 9.8*  HCT 31.0*  MCV 81.2  PLT 081   Basic Metabolic Panel:  Recent Labs Lab 09/30/16 1227  NA 135  K 4.2  CL 98*  CO2 28  GLUCOSE 108*  BUN 12  CREATININE 1.30*  CALCIUM 9.1   GFR: Estimated Creatinine Clearance: 30.1 mL/min (A) (by C-G formula based on SCr of 1.3 mg/dL (H)). Liver Function Tests: No results for input(s): AST, ALT, ALKPHOS, BILITOT, PROT, ALBUMIN in the last 168 hours. No results for input(s): LIPASE, AMYLASE in the last 168 hours. No results for input(s): AMMONIA in the last 168 hours. Coagulation Profile:  Recent Labs Lab 09/30/16 1227  INR 1.56   Cardiac Enzymes:  Recent Labs Lab 09/30/16 1227  TROPONINI <0.03   BNP (last 3 results) No results for input(s): PROBNP in the last 8760 hours. HbA1C: No results for input(s): HGBA1C in the last 72 hours. CBG:  Recent Labs Lab 09/30/16 1213  GLUCAP 121*   Lipid Profile: No results for input(s): CHOL, HDL, LDLCALC, TRIG, CHOLHDL, LDLDIRECT in the last 72 hours. Thyroid Function Tests: No results for input(s): TSH, T4TOTAL, FREET4, T3FREE, THYROIDAB in the last 72 hours. Anemia Panel: No results for input(s): VITAMINB12, FOLATE, FERRITIN, TIBC, IRON, RETICCTPCT in the last 72 hours. Urine analysis:    Component Value Date/Time   COLORURINE  STRAW (A)  09/30/2016 [place] 09/30/2016 1204   LABSPEC 1.004 (L) 09/30/2016 1204   PHURINE 7.0 09/30/2016 [place] 09/30/2016 1204   HGBUR NEGATIVE 09/30/2016 [place] 09/30/2016 [place] 09/30/2016 1204   PROTEINUR NEGATIVE 09/30/2016 1204   UROBILINOGEN 0.2 02/21/2015 2028   NITRITE NEGATIVE 09/30/2016 1204   LEUKOCYTESUR SMALL (A) 09/30/2016 1204   Sepsis Labs: @LABRCNTIP (procalcitonin:4,lacticidven:4) )No results found for this or any previous visit (from the past 240 hour(s)).   Radiological Exams on Admission: Dg Chest 2 View  Result Date: 09/30/2016 CLINICAL DATA:  Chest pain underneath the left breast. Shortness of breath. EXAM: CHEST  2 VIEW COMPARISON:  06/19/2016 and 04/23/2016 FINDINGS: Again noted are coarse lung markings which appear chronic. Lung markings most prominent the right upper lung but minimally changed. Heart size is with normal limits and stable. Atherosclerotic calcifications at the aortic arch. Trachea is midline. No large pleural effusions. No acute bone abnormality. IMPRESSION: Chronic lung changes without acute findings. Electronically Signed   By: Markus Daft M.D.   On: 09/30/2016 13:30   Ct Angio Chest Pe W Or Wo Contrast  Result Date: 09/30/2016 CLINICAL DATA:  Chest pain under the left breast for 2 days. Shortness of breath. Lumbar surgery in April 2018. EXAM: CT ANGIOGRAPHY CHEST WITH CONTRAST TECHNIQUE: Multidetector CT imaging of the chest was performed using the standard protocol during bolus administration of intravenous contrast. Multiplanar CT image reconstructions and MIPs were obtained to evaluate the vascular anatomy. CONTRAST:  80 mL Isovue 370 COMPARISON:  Chest CT 12/15/2015 FINDINGS: Cardiovascular: Negative for pulmonary embolism. Normal caliber of the thoracic aorta. Atherosclerotic calcifications at the aortic arch. Atherosclerotic calcifications involving the proximal left  subclavian artery. Coronary artery calcifications. Mediastinum/Nodes: Prominent soft tissue in the right subcarinal region measuring up to 1.7 cm in the short axis and previously measured 1.4 cm. Soft tissue fullness in the right infrahilar region. Enlarged lymph node at the AP window measures 1.6 cm in the short axis on sequence 4, image 28, previously measured 1.2 cm. Multiple small lymph nodes in the upper mediastinum. Again noted is prominent left hilar tissue measuring 0.9 cm in the short axis on sequence 4, image 39. No axillary lymphadenopathy. Lungs/Pleura: Trachea and mainstem bronchi are patent. Small amount of irregular pleural thickening in the posterior right lower chest. There is a 4 mm nodule in the superior segment of the right lower lobe which was not clearly present on the previous examination. This nodule is seen on sequence 6, image 49. Again noted is septal thickening throughout the lungs which may represent chronic changes. There is peripheral parenchymal disease in the posterolateral right upper lung. There was a small amount of reticulonodular disease in this area on the previous CT but this disease has markedly progressed. Size of this opacity measures roughly up to 4 cm. Additional small nodular densities in the right upper lung are likely inflammatory. Small amount of parenchymal disease along the medial aspect of the left lower lobe superior segment is similar to the prior examination. There is minimal disease along the posterior left upper lobe on sequence 6, image 16. Upper Abdomen: Gallbladder has been removed. Contrast refluxes into the hepatic veins and may represent increased right heart pressures. No acute abnormality in the upper abdomen. Abdominal aorta is heavily calcified. Musculoskeletal: No acute bone abnormality. Review of the MIP images confirms the above findings. IMPRESSION: Negative for pulmonary embolism. Peripheral parenchymal disease in the  right upper lobe. This  disease has progressed since 2017 and most likely represents acute on chronic disease. Burtis Junes an infectious or inflammatory process but a neoplastic process cannot be excluded. There are additional small foci disease in the right lung which may be inflammatory. Mediastinal and hilar lymphadenopathy. The mediastinal lymphadenopathy has progressed. Although these findings could be reactive, a neoplastic process cannot be excluded. Recommend a 3 month follow-up Chest CT with IV contrast to evaluate the lymphadenopathy and the right upper lobe disease. Small amount of irregular pleural thickening at the right lung base. This may be postinflammatory. Recommend attention on follow up imaging. Chronic lung changes with areas of septal thickening. Electronically Signed   By: Markus Daft M.D.   On: 09/30/2016 15:15    EKG: Independently reviewed. A Fib, rate controlled, no acute ischemic changes  Assessment/Plan Principal Problem:   Chest pain Active Problems:   CAD, NATIVE VESSEL - PCI + DES to left circumflex 05/14/13   Chronic diastolic heart failure (HCC)   Internal hemorrhoids    Chest Pain -With known CAD. -Saw cardiology on 6/8 and mentioned CP and there was no plan for further testing as medical management was recommended based on her cath in 8/17. -Cycle troponins, repeat EKG in am. -As long as she rules out, plan for DC home in am with OP follow up with cardiology.  Chronic Systolic CHF -Compensated. -known EF of 30-35%.  Hemorrhoids -Anusol suppositories BID.   Afib -Rate controlled. -Continue metoprolol, eliquis for anticoagulation.   DVT prophylaxis: Eliquis  Code Status: full code  Family Communication: daughter at bedside updated on plan of care and all questions answered  Disposition Plan: anticipate Dc home in 24 hours  Consults called: None  Admission status: Observation    Time Spent: 75 minutes  Lelon Frohlich MD [facility] Pager [phone]  If  7PM-7AM, please contact night-coverage [email or web address] Password [email or web address]  09/30/2016, 4:07 PM

## 2016-10-01 DIAGNOSIS — R079 Chest pain, unspecified: Secondary | ICD-10-CM | POA: Diagnosis not present

## 2016-10-01 DIAGNOSIS — I5033 Acute on chronic diastolic (congestive) heart failure: Secondary | ICD-10-CM | POA: Diagnosis not present

## 2016-10-01 DIAGNOSIS — I130 Hypertensive heart and chronic kidney disease with heart failure and stage 1 through stage 4 chronic kidney disease, or unspecified chronic kidney disease: Secondary | ICD-10-CM | POA: Diagnosis not present

## 2016-10-01 DIAGNOSIS — N183 Chronic kidney disease, stage 3 (moderate): Secondary | ICD-10-CM | POA: Diagnosis not present

## 2016-10-01 DIAGNOSIS — Z87891 Personal history of nicotine dependence: Secondary | ICD-10-CM | POA: Diagnosis not present

## 2016-10-01 DIAGNOSIS — Z79899 Other long term (current) drug therapy: Secondary | ICD-10-CM | POA: Diagnosis not present

## 2016-10-01 DIAGNOSIS — J449 Chronic obstructive pulmonary disease, unspecified: Secondary | ICD-10-CM | POA: Diagnosis not present

## 2016-10-01 DIAGNOSIS — I481 Persistent atrial fibrillation: Secondary | ICD-10-CM | POA: Diagnosis not present

## 2016-10-01 DIAGNOSIS — I25118 Atherosclerotic heart disease of native coronary artery with other forms of angina pectoris: Secondary | ICD-10-CM | POA: Diagnosis not present

## 2016-10-01 LAB — TROPONIN I: Troponin I: <0.03 ng/mL (ref <0.03)

## 2016-10-01 MED ORDER — MAGNESIUM OXIDE 400 (241.3 MG) MG PO TABS
400.0000 mg | ORAL_TABLET | Freq: Every day | ORAL | Status: DC
  Administered 2016-10-01: 400 mg via ORAL
  Filled 2016-10-01: qty 1

## 2016-10-01 NOTE — Progress Notes (Signed)
Discharge instructions discussed with patient.  Pt verbalized understanding of all instructions.

## 2016-10-01 NOTE — Discharge Instructions (Signed)
Chest Wall Pain °Chest wall pain is pain in or around the bones and muscles of your chest. Sometimes, an injury causes this pain. Sometimes, the cause may not be known. This pain may take several weeks or longer to get better. °Follow these instructions at home: °Pay attention to any changes in your symptoms. Take these actions to help with your pain: °· Rest as told by your health care provider. °· Avoid activities that cause pain. These include any activities that use your chest muscles or your abdominal and side muscles to lift heavy items. °· If directed, apply ice to the painful area: °¨ Put ice in a plastic bag. °¨ Place a towel between your skin and the bag. °¨ Leave the ice on for 20 minutes, 2-3 times per day. °· Take over-the-counter and prescription medicines only as told by your health care provider. °· Do not use tobacco products, including cigarettes, chewing tobacco, and e-cigarettes. If you need help quitting, ask your health care provider. °· Keep all follow-up visits as told by your health care provider. This is important. °Contact a health care provider if: °· You have a fever. °· Your chest pain becomes worse. °· You have new symptoms. °Get help right away if: °· You have nausea or vomiting. °· You feel sweaty or light-headed. °· You have a cough with phlegm (sputum) or you cough up blood. °· You develop shortness of breath. °This information is not intended to replace advice given to you by your health care provider. Make sure you discuss any questions you have with your health care provider. °Document Released: 04/03/2005 Document Revised: 08/12/2015 Document Reviewed: 06/29/2014 °Elsevier Interactive Patient Education © 2017 [employer] ° °

## 2016-10-01 NOTE — Discharge Summary (Signed)
Physician Discharge Summary  [name] NKN:[id number] DOB: [birth year] DOA: 09/30/2016  PCP: Tawni Carnes, PA-C  Admit date: 09/30/2016 Discharge date: 10/01/2016  Time spent: 45 minutes  Recommendations for Outpatient Follow-up:  -Will be discharged home today.  -Advised to follow up with PCP in 2 weeks and with cardiology as scheduled.  Discharge Diagnoses:  Principal Problem:   Chest pain Active Problems:   CAD, NATIVE VESSEL - PCI + DES to left circumflex 05/14/13   Chronic diastolic heart failure ([place])   Internal hemorrhoids   Discharge Condition: Stable and improved  Filed Weights   09/30/16 1146 09/30/16 1554  Weight: 68 kg (150 lb) 68.9 kg (151 lb 14.4 oz)    History of present illness:    [name] is a 83 y.o. female with h/o CAD, a fib and chronic systolic CHF. Cardiology's notes reviewed: "She underwent percutaneous coronary intervention with drug-eluting stent to the left circumflex coronary artery in January 2015. Coronary angiography 12/16/15 showed 20% stenosis in the proximal LAD and in the inferior branch of the first diagonal with a widely patent previously placed circumflex stent. RCA was small and nondominant with 80% ostial stenosis. Medical therapy was recommended." She comes in today with 2 days of non-exertional CP that is substernal and non-radiating. Nothing makes it better or worse. Initial troponin is negative, EKG shoes a fib with acute ischemic changes, CT angio negative for PE. Admission is requested.  Hospital Course:   Chest Pain -R/o for ACS by EKG and troponins. -F/u op with cards as scheduled. -Pain has resolved.  Rest of chronic issues have been stable during this short hospitalization.  Procedures:  None   Consultations:  None  Discharge Instructions  Discharge Instructions    Diet - low sodium heart healthy    Complete by:  As directed    Increase activity slowly    Complete by:  As directed      Allergies  as of 10/01/2016      Reactions   Cephalosporins Diarrhea, Nausea Only   Lightheaded   Levaquin [levofloxacin In D5w] Swelling   Macrodantin [nitrofurantoin Macrocrystal] Swelling   Phenothiazines Anaphylaxis, Hives   Polysorbate Anaphylaxis   Prednisone Shortness Of Breath   Buspirone Itching   Cardura [doxazosin Mesylate] Itching   Codeine Itching   Acyclovir And Related Itching   Redness of skin   Prochlorperazine Other (See Comments)   "Upset stomach"   Ranexa [ranolazine]    Severe drop in BP   Atorvastatin Hives   Cramping; tolerates Crestor ok   Ofloxacin Rash   Other Itching, Rash   "WOOL"= make skin look like it has been burned   Penicillins Other (See Comments)   Causes redness all over. Has patient had a PCN reaction causing immediate rash, facial/tongue/throat swelling, SOB or lightheadedness with hypotension: No Has patient had a PCN reaction causing severe rash involving mucus membranes or skin necrosis: No Has patient had a PCN reaction that required hospitalization No Has patient had a PCN reaction occurring within the last 10 years: No If all of the above answers are "NO", then may proceed with Cephalosporin use.   Pimozide Hives, Itching      Medication List    TAKE these medications   acetaminophen 500 MG tablet Commonly known as:  TYLENOL Take 500 mg by mouth daily as needed for headache.   ALPRAZolam 0.25 MG tablet Commonly known as:  XANAX Take 0.25 mg by mouth 2 (two) times daily  as needed.   apixaban 5 MG Tabs tablet Commonly known as:  ELIQUIS Take 1 tablet (5 mg total) by mouth 2 (two) times daily.   bisacodyl 5 MG EC tablet Commonly known as:  DULCOLAX Take 1 tablet (5 mg total) by mouth daily as needed for moderate constipation.   DULoxetine 60 MG capsule Commonly known as:  CYMBALTA Take 60 mg by mouth at bedtime.   fluticasone 50 MCG/ACT nasal spray Commonly known as:  FLONASE Place 2 sprays into both nostrils daily.     furosemide 20 MG tablet Commonly known as:  LASIX Take 20 mg by mouth every evening.   furosemide 40 MG tablet Commonly known as:  LASIX Take 40 mg by mouth daily.   gabapentin 300 MG capsule Commonly known as:  NEURONTIN Take 1 capsule (300 mg total) by mouth 3 (three) times daily.   hydrocortisone 25 MG suppository Commonly known as:  ANUSOL-HC Place 1 suppository (25 mg total) rectally every 12 (twelve) hours.   ipratropium-albuterol 0.5-2.5 (3) MG/3ML Soln Commonly known as:  DUONEB Take 3 mLs by nebulization every 6 (six) hours.   Magnesium 400 MG Tabs Take 400 mg by mouth daily.   methocarbamol 500 MG tablet Commonly known as:  ROBAXIN Take 500 mg by mouth 2 (two) times daily as needed for muscle spasms.   metoprolol succinate 100 MG 24 hr tablet Commonly known as:  TOPROL-XL Take 1 tablet (100 mg total) by mouth daily. Take with or immediately following a meal.   multivitamin with minerals Tabs tablet Take 1 tablet by mouth daily with lunch. Centrum   nitroGLYCERIN 0.4 MG SL tablet Commonly known as:  NITROSTAT Place 0.4 mg under the tongue every 5 (five) minutes as needed for chest pain. Reported on 08/04/2015   oxyCODONE 5 MG immediate release tablet Commonly known as:  ROXICODONE Take one tablet by mouth twice daily as needed for pain   pantoprazole 40 MG tablet Commonly known as:  PROTONIX Take 40 mg by mouth daily.   potassium chloride SA 20 MEQ tablet Commonly known as:  K-DUR,KLOR-CON Take 20 mEq by mouth daily.      Allergies  Allergen Reactions  . Cephalosporins Diarrhea and Nausea Only    Lightheaded  . Levaquin [Levofloxacin In D5w] Swelling  . Macrodantin [Nitrofurantoin Macrocrystal] Swelling  . Phenothiazines Anaphylaxis and Hives  . Polysorbate Anaphylaxis  . Prednisone Shortness Of Breath  . Buspirone Itching  . Cardura [Doxazosin Mesylate] Itching  . Codeine Itching  . Acyclovir And Related Itching    Redness of skin  .  Prochlorperazine Other (See Comments)    "Upset stomach"  . Ranexa [Ranolazine]     Severe drop in BP  . Atorvastatin Hives    Cramping; tolerates Crestor ok  . Ofloxacin Rash  . Other Itching and Rash    "WOOL"= make skin look like it has been burned  . Penicillins Other (See Comments)    Causes redness all over. Has patient had a PCN reaction causing immediate rash, facial/tongue/throat swelling, SOB or lightheadedness with hypotension: No Has patient had a PCN reaction causing severe rash involving mucus membranes or skin necrosis: No Has patient had a PCN reaction that required hospitalization No Has patient had a PCN reaction occurring within the last 10 years: No If all of the above answers are "NO", then may proceed with Cephalosporin use.   . Pimozide Hives and Itching   Follow-up Information    Tawni Carnes, [place]. Schedule an appointment  as soon as possible for a visit in 2 week(s).   Specialty:  Physician Assistant Contact information: [phone]            The results of significant diagnostics from this hospitalization (including imaging, microbiology, ancillary and laboratory) are listed below for reference.    Significant Diagnostic Studies: Dg Chest 2 View  Result Date: 09/30/2016 CLINICAL DATA:  Chest pain underneath the left breast. Shortness of breath. EXAM: CHEST  2 VIEW COMPARISON:  06/19/2016 and 04/23/2016 FINDINGS: Again noted are coarse lung markings which appear chronic. Lung markings most prominent the right upper lung but minimally changed. Heart size is with normal limits and stable. Atherosclerotic calcifications at the aortic arch. Trachea is midline. No large pleural effusions. No acute bone abnormality. IMPRESSION: Chronic lung changes without acute findings. Electronically Signed   By: Markus Daft M.D.   On: 09/30/2016 13:30   Ct Angio Chest Pe W Or Wo Contrast  Result Date: 09/30/2016 CLINICAL DATA:  Chest pain under  the left breast for 2 days. Shortness of breath. Lumbar surgery in April 2018. EXAM: CT ANGIOGRAPHY CHEST WITH CONTRAST TECHNIQUE: Multidetector CT imaging of the chest was performed using the standard protocol during bolus administration of intravenous contrast. Multiplanar CT image reconstructions and MIPs were obtained to evaluate the vascular anatomy. CONTRAST:  80 mL Isovue 370 COMPARISON:  Chest CT 12/15/2015 FINDINGS: Cardiovascular: Negative for pulmonary embolism. Normal caliber of the thoracic aorta. Atherosclerotic calcifications at the aortic arch. Atherosclerotic calcifications involving the proximal left subclavian artery. Coronary artery calcifications. Mediastinum/Nodes: Prominent soft tissue in the right subcarinal region measuring up to 1.7 cm in the short axis and previously measured 1.4 cm. Soft tissue fullness in the right infrahilar region. Enlarged lymph node at the AP window measures 1.6 cm in the short axis on sequence 4, image 28, previously measured 1.2 cm. Multiple small lymph nodes in the upper mediastinum. Again noted is prominent left hilar tissue measuring 0.9 cm in the short axis on sequence 4, image 39. No axillary lymphadenopathy. Lungs/Pleura: Trachea and mainstem bronchi are patent. Small amount of irregular pleural thickening in the posterior right lower chest. There is a 4 mm nodule in the superior segment of the right lower lobe which was not clearly present on the previous examination. This nodule is seen on sequence 6, image 49. Again noted is septal thickening throughout the lungs which may represent chronic changes. There is peripheral parenchymal disease in the posterolateral right upper lung. There was a small amount of reticulonodular disease in this area on the previous CT but this disease has markedly progressed. Size of this opacity measures roughly up to 4 cm. Additional small nodular densities in the right upper lung are likely inflammatory. Small amount of  parenchymal disease along the medial aspect of the left lower lobe superior segment is similar to the prior examination. There is minimal disease along the posterior left upper lobe on sequence 6, image 16. Upper Abdomen: Gallbladder has been removed. Contrast refluxes into the hepatic veins and may represent increased right heart pressures. No acute abnormality in the upper abdomen. Abdominal aorta is heavily calcified. Musculoskeletal: No acute bone abnormality. Review of the MIP images confirms the above findings. IMPRESSION: Negative for pulmonary embolism. Peripheral parenchymal disease in the right upper lobe. This disease has progressed since 2017 and most likely represents acute on chronic disease. Burtis Junes an infectious or inflammatory process but a neoplastic process cannot be excluded. There are additional  small foci disease in the right lung which may be inflammatory. Mediastinal and hilar lymphadenopathy. The mediastinal lymphadenopathy has progressed. Although these findings could be reactive, a neoplastic process cannot be excluded. Recommend a 3 month follow-up Chest CT with IV contrast to evaluate the lymphadenopathy and the right upper lobe disease. Small amount of irregular pleural thickening at the right lung base. This may be postinflammatory. Recommend attention on follow up imaging. Chronic lung changes with areas of septal thickening. Electronically Signed   By: Markus Daft M.D.   On: 09/30/2016 15:15    Microbiology: No results found for this or any previous visit (from the past 240 hour(s)).   Labs: Basic Metabolic Panel:  Recent Labs Lab 09/30/16 1227  NA 135  K 4.2  CL 98*  CO2 28  GLUCOSE 108*  BUN 12  CREATININE 1.30*  CALCIUM 9.1   Liver Function Tests: No results for input(s): AST, ALT, ALKPHOS, BILITOT, PROT, ALBUMIN in the last 168 hours. No results for input(s): LIPASE, AMYLASE in the last 168 hours. No results for input(s): AMMONIA in the last 168  hours. CBC:  Recent Labs Lab 09/30/16 1227  WBC 9.5  NEUTROABS 6.0  HGB 9.8*  HCT 31.0*  MCV 81.2  PLT 363   Cardiac Enzymes:  Recent Labs Lab 09/30/16 1227 09/30/16 1622 09/30/16 2254  TROPONINI <0.03 <0.03 <0.03   BNP: BNP (last 3 results)  Recent Labs  03/20/16 1553 04/15/16 1423 08/22/16 0700  BNP 338.0* 458.0* 736.0*    ProBNP (last 3 results) No results for input(s): PROBNP in the last 8760 hours.  CBG:  Recent Labs Lab 09/30/16 1213  GLUCAP 121*       Signed:  HERNANDEZ ACOSTA,ESTELA  [facility] Pager: [phone] 10/01/2016, 1:39 PM

## 2016-10-03 ENCOUNTER — Ambulatory Visit: Payer: Medicare Other | Admitting: Nurse Practitioner

## 2016-10-06 DIAGNOSIS — J449 Chronic obstructive pulmonary disease, unspecified: Secondary | ICD-10-CM | POA: Diagnosis not present

## 2016-10-06 DIAGNOSIS — I2510 Atherosclerotic heart disease of native coronary artery without angina pectoris: Secondary | ICD-10-CM | POA: Diagnosis not present

## 2016-10-06 DIAGNOSIS — I252 Old myocardial infarction: Secondary | ICD-10-CM | POA: Diagnosis not present

## 2016-10-06 DIAGNOSIS — I110 Hypertensive heart disease with heart failure: Secondary | ICD-10-CM | POA: Diagnosis not present

## 2016-10-06 DIAGNOSIS — Z4789 Encounter for other orthopedic aftercare: Secondary | ICD-10-CM | POA: Diagnosis not present

## 2016-10-06 DIAGNOSIS — M4317 Spondylolisthesis, lumbosacral region: Secondary | ICD-10-CM | POA: Diagnosis not present

## 2016-10-09 DIAGNOSIS — I2510 Atherosclerotic heart disease of native coronary artery without angina pectoris: Secondary | ICD-10-CM | POA: Diagnosis not present

## 2016-10-09 DIAGNOSIS — Z4789 Encounter for other orthopedic aftercare: Secondary | ICD-10-CM | POA: Diagnosis not present

## 2016-10-09 DIAGNOSIS — I110 Hypertensive heart disease with heart failure: Secondary | ICD-10-CM | POA: Diagnosis not present

## 2016-10-09 DIAGNOSIS — J449 Chronic obstructive pulmonary disease, unspecified: Secondary | ICD-10-CM | POA: Diagnosis not present

## 2016-10-09 DIAGNOSIS — M4317 Spondylolisthesis, lumbosacral region: Secondary | ICD-10-CM | POA: Diagnosis not present

## 2016-10-09 DIAGNOSIS — I252 Old myocardial infarction: Secondary | ICD-10-CM | POA: Diagnosis not present

## 2016-10-16 DIAGNOSIS — I482 Chronic atrial fibrillation: Secondary | ICD-10-CM | POA: Diagnosis not present

## 2016-10-16 DIAGNOSIS — M545 Low back pain: Secondary | ICD-10-CM | POA: Diagnosis not present

## 2016-10-16 DIAGNOSIS — I5033 Acute on chronic diastolic (congestive) heart failure: Secondary | ICD-10-CM | POA: Diagnosis not present

## 2016-10-16 DIAGNOSIS — R079 Chest pain, unspecified: Secondary | ICD-10-CM | POA: Diagnosis not present

## 2016-10-16 DIAGNOSIS — Z6827 Body mass index (BMI) 27.0-27.9, adult: Secondary | ICD-10-CM | POA: Diagnosis not present

## 2016-10-16 DIAGNOSIS — F419 Anxiety disorder, unspecified: Secondary | ICD-10-CM | POA: Diagnosis not present

## 2016-10-16 DIAGNOSIS — R590 Localized enlarged lymph nodes: Secondary | ICD-10-CM | POA: Diagnosis not present

## 2016-10-18 DIAGNOSIS — I252 Old myocardial infarction: Secondary | ICD-10-CM | POA: Diagnosis not present

## 2016-10-18 DIAGNOSIS — Z4789 Encounter for other orthopedic aftercare: Secondary | ICD-10-CM | POA: Diagnosis not present

## 2016-10-18 DIAGNOSIS — I2510 Atherosclerotic heart disease of native coronary artery without angina pectoris: Secondary | ICD-10-CM | POA: Diagnosis not present

## 2016-10-18 DIAGNOSIS — I110 Hypertensive heart disease with heart failure: Secondary | ICD-10-CM | POA: Diagnosis not present

## 2016-10-18 DIAGNOSIS — J449 Chronic obstructive pulmonary disease, unspecified: Secondary | ICD-10-CM | POA: Diagnosis not present

## 2016-10-18 DIAGNOSIS — M4317 Spondylolisthesis, lumbosacral region: Secondary | ICD-10-CM | POA: Diagnosis not present

## 2016-10-23 DIAGNOSIS — I252 Old myocardial infarction: Secondary | ICD-10-CM | POA: Diagnosis not present

## 2016-10-23 DIAGNOSIS — I2510 Atherosclerotic heart disease of native coronary artery without angina pectoris: Secondary | ICD-10-CM | POA: Diagnosis not present

## 2016-10-23 DIAGNOSIS — I110 Hypertensive heart disease with heart failure: Secondary | ICD-10-CM | POA: Diagnosis not present

## 2016-10-23 DIAGNOSIS — J449 Chronic obstructive pulmonary disease, unspecified: Secondary | ICD-10-CM | POA: Diagnosis not present

## 2016-10-23 DIAGNOSIS — M4317 Spondylolisthesis, lumbosacral region: Secondary | ICD-10-CM | POA: Diagnosis not present

## 2016-10-23 DIAGNOSIS — Z4789 Encounter for other orthopedic aftercare: Secondary | ICD-10-CM | POA: Diagnosis not present

## 2016-11-01 DIAGNOSIS — M4316 Spondylolisthesis, lumbar region: Secondary | ICD-10-CM | POA: Diagnosis not present

## 2016-11-17 ENCOUNTER — Other Ambulatory Visit: Payer: Self-pay | Admitting: Cardiovascular Disease

## 2016-11-22 DIAGNOSIS — D649 Anemia, unspecified: Secondary | ICD-10-CM | POA: Diagnosis not present

## 2016-11-23 ENCOUNTER — Ambulatory Visit: Payer: Medicare Other | Admitting: Nurse Practitioner

## 2016-11-28 NOTE — Progress Notes (Signed)
Cardiology Office Note    Date:  11/29/2016   ID:  [name], DOB [birth year], MRN [id number]  PCP:  Tawni Carnes, PA-C  Cardiologist: Dr. Bronson Ing  Chief Complaint  Patient presents with  . Follow-up    History of Present Illness:  [name] is a 83 y.o. female with history of CAD status post DES to the circumflex 04/2013. Cath 12/16/15 showed 20% stenosis in the proximal LAD and inferior branch of the first diagonal with a widely patent circumflex stent. RCA was small and nondominant with 80% ostial stenosis. Medical therapy was recommended. She also has chronic systolic CHF LVEF 32-44% on echo 04/17/16 and atrial fibrillation.  At last office visit 09/22/16 she was having some chest pain and Toprol XL increased to 100 mg daily.  She was admitted to the hospital 09/30/16 with recurrent chest pain. She ruled out with negative troponins and EKGs. She was discharged home the next day to follow-up with [name]. Review of labs creatinine was 1.30 hemoglobin 9.8 troponins negative 3. EKG 10/02/16 reviewed showed atrial fibrillation at 115 bpm with nonspecific ST-T wave changes.  Complains of sharp shooting pain into her back, also finger point sticking and dyspnea on exertion. No chest tightness. Complains of dyspnea with little activity associated with heart racing. Also told she was anemic by family doctor 2 weeks ago but she's having trouble taking iron tablets.HR is 100bpm at rest.   Past Medical History:  Diagnosis Date  . Anxiety   . Arthritis   . Atrial fibrillation ([place])   . Bursitis    left shoulder  . Cataract   . CHF (congestive heart failure) (HCC)    EF 55-60%  . Complication of anesthesia   . COPD (chronic obstructive pulmonary disease) ([place])   . Coronary atherosclerosis of native coronary artery    a. DES to CX, moderately severe stenosis RCA, mild stenosis LAD 04/2013  . Depression   . Dysphagia, unspecified(787.20)   . GERD (gastroesophageal reflux disease)    Hx Schatzki's ring, multiple EGD/ED last 01/06/2004  . Headache   . Heart disease   . Heart murmur    'a littel'  . History of anemia   . HTN (hypertension)    Hx of it  . Hyperlipemia   . Hyperlipidemia   . Internal hemorrhoids without mention of complication   . MI (myocardial infarction) ([place]) 2006  . Microscopic colitis 2003  . Other and unspecified hyperlipidemia   . Panic disorder without agoraphobia   . Paresthesia    hands, feet  . Pneumonia 12/2011  . PONV (postoperative nausea and vomiting)    'a little nausea"  . PVD (peripheral vascular disease) ([place])   . S/P colonoscopy 09/27/2001   internal hemorrhoids, desc colon inflam polyp, SB BX-chronic duodenitis, colitis  . Shortness of breath   . Sleep disorder    obstructive  . Thyroid disease    recent abnl TSH per pt    Past Surgical History:  Procedure Laterality Date  . ABDOMINAL HYSTERECTOMY    . ANTERIOR AND POSTERIOR REPAIR     with resection of vagina  . ANTERIOR LAT LUMBAR FUSION N/A 08/01/2016   Procedure: Lumbar Two-Lumbar Five Transpsoas lateral interbody fusion with Lumbar Two-Three lateral plate fixation;  Surgeon: Kevan Ny Ditty, MD;  Location: [facility];  Service: Neurosurgery;  Laterality: N/A;  L2-5 Transpsoas lateral interbody fusion with L2-3 lateral plate fixation  . APPENDECTOMY    . BACK SURGERY    .  BIOPSY  07/05/2015   Procedure: BIOPSY;  Surgeon: Daneil Dolin, MD;  [facility];  Service: Endoscopy;;  gastric polyp biopsy, ascending colon biopsy  . BLADDER SUSPENSION  11/09/2011   Procedure: TRANSVAGINAL TAPE (TVT) PROCEDURE;  Surgeon: Marissa Nestle, MD;  Location: [facility];  Service: Urology;  Laterality: N/A;  . bladder tack  06/2010  . BREAST LUMPECTOMY  1998   left, benign  . CARDIAC CATHETERIZATION    . CARDIAC CATHETERIZATION    . CARDIAC CATHETERIZATION N/A 12/16/2015   Procedure: Left Heart Cath and Coronary Angiography;  Surgeon: Troy Sine, MD;  Location: [facility];  Service: Cardiovascular;  Laterality: N/A;  . [place]   left  . CHOLECYSTECTOMY  1998  . COLONOSCOPY  03/16/2011   multiple hyperplastic colon polyps, sigmoid diverticulosis, melanosis coli  . COLONOSCOPY WITH PROPOFOL N/A 07/05/2015   RMR:one 5 mm polyp in descending colon  . CORONARY ANGIOPLASTY WITH STENT PLACEMENT    . ESOPHAGEAL DILATION N/A 07/05/2015   Procedure: ESOPHAGEAL DILATION;  Surgeon: Daneil Dolin, MD;  [facility];  Service: Endoscopy;  Laterality: N/A;  . ESOPHAGOGASTRODUODENOSCOPY (EGD) WITH PROPOFOL N/A 07/05/2015   [id number]  . JOINT REPLACEMENT Right 2007   right knee  . left hand surgery    . LEFT HEART CATHETERIZATION WITH CORONARY ANGIOGRAM N/A 05/14/2013   Procedure: LEFT HEART CATHETERIZATION WITH CORONARY ANGIOGRAM;  Surgeon: Blane Ohara, MD;  Location: [facility] CATH LAB;  Service: Cardiovascular;  Laterality: N/A;  . left rotator cuff surgery    . LUMBAR LAMINECTOMY/DECOMPRESSION MICRODISCECTOMY N/A 10/11/2012   Procedure: LUMBAR LAMINECTOMY/DECOMPRESSION MICRODISCECTOMY 2 LEVELS;  Surgeon: Floyce Stakes, MD;  Location: [facility];  Service: Neurosurgery;  Laterality: N/A;  L3-4 L4-5 Laminectomy  . LUMBAR WOUND DEBRIDEMENT N/A 09/27/2015   Procedure: Exploration of Lumbar Wound w/ Repair CSF Leak/Lumbar Drain Placement;  Surgeon: Leeroy Cha, MD;  Location: [facility];  Service: Neurosurgery;  Laterality: N/A;  . MALONEY DILATION  03/16/2011   Gastritis. No H.pylori on bx. 46F maloney dilation with disruption of  occult cevical esophageal web  . NASAL SINUS SURGERY    . right knee replacement  2007  . right leg benign tumor    . SHOULDER SURGERY Left   . TONSILLECTOMY    . unspecified area, hysterectomy  1972   partial    Current Medications: Current Meds  Medication Sig  . acetaminophen (TYLENOL) 500 MG tablet Take 500 mg by mouth daily as needed for headache.   . ALPRAZolam (XANAX) 0.25 MG  tablet Take 0.25 mg by mouth 2 (two) times daily as needed.  . bisacodyl (DULCOLAX) 5 MG EC tablet Take 1 tablet (5 mg total) by mouth daily as needed for moderate constipation.  . DULoxetine (CYMBALTA) 60 MG capsule Take 60 mg by mouth at bedtime.   [name] Kitchen ELIQUIS 5 MG TABS tablet TAKE ONE TABLET BY MOUTH TWICE DAILY.  . fluticasone (FLONASE) 50 MCG/ACT nasal spray Place 2 sprays into both nostrils daily.   . furosemide (LASIX) 40 MG tablet Take 40 mg by mouth daily.  [name] Kitchen gabapentin (NEURONTIN) 300 MG capsule Take 1 capsule (300 mg total) by mouth 3 (three) times daily.  . hydrocortisone (ANUSOL-HC) 25 MG suppository Place 1 suppository (25 mg total) rectally every 12 (twelve) hours.  [name] Kitchen ipratropium-albuterol (DUONEB) 0.5-2.5 (3) MG/3ML SOLN Take 3 mLs by nebulization every 6 (six) hours.  . Magnesium 400 MG TABS  Take 400 mg by mouth daily.  . methocarbamol (ROBAXIN) 500 MG tablet Take 500 mg by mouth 2 (two) times daily as needed for muscle spasms.  . Multiple Vitamin (MULTIVITAMIN WITH MINERALS) TABS tablet Take 1 tablet by mouth daily with lunch. Centrum  . nitroGLYCERIN (NITROSTAT) 0.4 MG SL tablet Place 0.4 mg under the tongue every 5 (five) minutes as needed for chest pain. Reported on 08/04/2015  . oxyCODONE (ROXICODONE) 5 MG immediate release tablet Take one tablet by mouth twice daily as needed for pain  . pantoprazole (PROTONIX) 40 MG tablet Take 40 mg by mouth daily.   . potassium chloride (K-DUR,KLOR-CON) 10 MEQ tablet TAKE TWO (2) TABLETS BY MOUTH DAILY.  [name] Kitchen potassium chloride SA (K-DUR,KLOR-CON) 20 MEQ tablet Take 20 mEq by mouth daily.   . [DISCONTINUED] furosemide (LASIX) 20 MG tablet Take 20 mg by mouth every evening.  . [DISCONTINUED] metoprolol succinate (TOPROL-XL) 100 MG 24 hr tablet Take 1 tablet (100 mg total) by mouth daily. Take with or immediately following a meal.     Allergies:   Cephalosporins; Levaquin [levofloxacin in d5w]; Macrodantin [nitrofurantoin macrocrystal];  Phenothiazines; Polysorbate; Prednisone; Buspirone; Cardura [doxazosin mesylate]; Codeine; Acyclovir and related; Prochlorperazine; Ranexa [ranolazine]; Atorvastatin; Ofloxacin; Other; Penicillins; and Pimozide   Social History   Social History  . Marital status: Divorced    Spouse name: N/A  . Number of children: 5  . Years of education: N/A   Occupational History  . retired    Social History Main Topics  . Smoking status: Former Smoker    Packs/day: 1.00    Years: 64.00    Types: Cigarettes    Start date: 12/24/1947    Quit date: 11/17/2001  . Smokeless tobacco: Never Used     Comment: Quit smoking in 2003  . Alcohol use No  . Drug use: No  . Sexual activity: No   Other Topics Concern  . None   Social History Narrative   Divorced.   Sister had colon perforation & died from complications in [place], [place]     Family History:  The patient's family history includes Cancer in her other; Coronary artery disease in her other; Diabetes in her brother and son; Heart disease in her son; Hypertension in her other; Parkinson's disease in her father; Stroke in her mother and other; Stroke (age of onset: 3) in her daughter.   ROS:   Please see the history of present illness.    Review of Systems  Constitution: Positive for weakness and malaise/fatigue.  HENT: Negative.   Eyes: Negative.   Cardiovascular: Positive for chest pain, dyspnea on exertion and near-syncope.  Respiratory: Negative.   Hematologic/Lymphatic: Negative.   Musculoskeletal: Negative.  Negative for joint pain.  Gastrointestinal: Negative.   Genitourinary: Negative.    All other systems reviewed and are negative.   PHYSICAL EXAM:   VS:  BP 128/68 (BP Location: Right Arm)   Pulse 99   Ht 5\' 1"  (1.549 m)   Wt 152 lb (68.9 kg)   SpO2 97%   BMI 28.72 kg/m   Physical Exam  GEN: Well nourished, well developed, in no acute distress  Neck: no JVD, carotid bruits, or masses Cardiac: irregular irregular at 100  bpm Respiratory:  Decreased breath sounds but clear to auscultation bilaterally, normal work of breathing GI: soft, nontender, nondistended, + BS Ext: without cyanosis, clubbing, or edema, Good distal pulses bilaterally Neuro:  Alert and Oriented x 3 Psych: euthymic mood, full affect  Wt Readings from  Last 3 Encounters:  11/29/16 152 lb (68.9 kg)  09/30/16 151 lb 14.4 oz (68.9 kg)  09/22/16 150 lb (68 kg)      Studies/Labs Reviewed:   EKG:  EKG is not ordered today.    Recent Labs: 04/15/2016: ALT 15; TSH 3.000 08/22/2016: B Natriuretic Peptide 736.0; Magnesium 2.0 09/30/2016: BUN 12; Creatinine, Ser 1.30; Hemoglobin 9.8; Platelets 363; Potassium 4.2; Sodium 135   Lipid Panel    Component Value Date/Time   CHOL 177 12/16/2015 0435   TRIG 197 (H) 12/16/2015 0435   HDL 35 (L) 12/16/2015 0435   CHOLHDL 5.1 12/16/2015 0435   VLDL 39 12/16/2015 0435   LDLCALC 103 (H) 12/16/2015 0435    Additional studies/ records that were reviewed today include:   Echocardiogram 04/17/16:   - Left ventricle: The cavity size was normal. There was mild   concentric hypertrophy. Systolic function was moderately to   severely reduced. The estimated ejection fraction was in the   range of 30% to 35%. Wall motion was normal; there were no   regional wall motion abnormalities. - Aortic valve: Mildly calcified annulus. Trileaflet; normal   thickness, mildly calcified leaflets. There was mild   regurgitation. - Mitral valve: There was moderate regurgitation. - Left atrium: The atrium was moderately dilated. - Pulmonary arteries: Systolic pressure was mildly increased. PA   peak pressure: 38 mm Hg (S). - Pericardium, extracardiac: A small pericardial effusion was   identified.   This is a decline in LVEF which was previously 55% on 12/17/15.   She underwent percutaneous coronary intervention with drug-eluting stent to the left circumflex coronary artery in January 2015. Coronary angiography 12/16/15  showed 20% stenosis in the proximal LAD and in the inferior branch of the first diagonal with a widely patent previously placed circumflex stent. RCA was small and nondominant with 80% ostial stenosis. Medical therapy was recommended.       Echocardiogram 04/17/16:   - Left ventricle: The cavity size was normal. There was mild   concentric hypertrophy. Systolic function was moderately to   severely reduced. The estimated ejection fraction was in the   range of 30% to 35%. Wall motion was normal; there were no   regional wall motion abnormalities. - Aortic valve: Mildly calcified annulus. Trileaflet; normal   thickness, mildly calcified leaflets. There was mild   regurgitation. - Mitral valve: There was moderate regurgitation. - Left atrium: The atrium was moderately dilated. - Pulmonary arteries: Systolic pressure was mildly increased. PA   peak pressure: 38 mm Hg (S). - Pericardium, extracardiac: A small pericardial effusion was   identified.   This is a decline in LVEF which was previously 55% on 12/17/15.   She underwent percutaneous coronary intervention with drug-eluting stent to the left circumflex coronary artery in January 2015. Coronary angiography 12/16/15 showed 20% stenosis in the proximal LAD and in the inferior branch of the first diagonal with a widely patent previously placed circumflex stent. RCA was small and nondominant with 80% ostial stenosis. Medical therapy was recommended.       12/2015 echo Study Conclusions   - Left ventricle: The cavity size was normal. Wall thickness was   increased in a pattern of mild LVH. The estimated ejection   fraction was 55%. Left ventricular diastolic function parameters   were normal. - Aortic valve: There was mild regurgitation. - Atrial septum: No defect or patent foramen ovale was identified.   11/2015 cath  Ost RCA lesion, 80 %stenosed.  Colon Flattery  Cx to Prox Cx lesion, 0 %stenosed.  Ost LAD to Prox LAD lesion, 20  %stenosed.  1st Diag lesion, 20 %stenosed.  4th Mrg lesion, 20 %stenosed.  The left ventricular systolic function is normal.  LV end diastolic pressure is mildly elevated.  The left ventricular ejection fraction is 60-65% by visual estimate.   Normal LV function with an ejection fraction of 60-65%.  There is evidence for left ventricular hypertrophy.   Coronary obstructive disease with evidence for smooth eccentric plaque in the proximal LAD of 20%, 20% stenosis in the inferior branch of the first diagonal vessel; a widely patent previously placed left circumflex stent extending from the ostium to the first marginal takeoff in a large dominant circumflex vessel with 20% distal narrowing; and a small nondominant RCA with 80% ostial stenosis.   RECOMMENDATION: Medical therapy.  Of note, the patient does have significant change in respiratory intrathoracic pressure from inspiratory and expiratory cycles.      ASSESSMENT:    1. DYSPNEA   2. Atherosclerosis of native coronary artery of native heart, angina presence unspecified   3. PAF (paroxysmal atrial fibrillation) ([place])   4. Essential hypertension   5. Chronic combined systolic and diastolic congestive heart failure ([place])   6. Anemia, unspecified type      PLAN:  In order of problems listed above:  Dyspnea on exertion suspect secondary to atrial fibrillation with increased rates as well as anemia. Will increase metoprolol to 150 mg daily. She will see Dr.Koneswaran back in 1 month.   CAD status post DES to the circumflex with small nondominant RCA disease. Nuclear stress test normal in June 2017. Still haven't some chest pain but atypical. Continue to monitor.  PAF with increased rates over 100 at rest. Will increase Toprol the 150 mg daily. Continue Eliquis.  Chronic combined systolic and diastolic CHF EF 65-68%. No evidence of heart failure on exam. Continue Lasix 40 mg once daily.   Anemia hemoglobin was 9.8 in June  recently checked by primary care and told to take iron but she is having trouble because of nausea. She sees Dr. Buford Dresser in October. This could be contributing to her dyspnea. Encouraged her to take iron with food.  Medication Adjustments/Labs and Tests Ordered: Current medicines are reviewed at length with the patient today.  Concerns regarding medicines are outlined above.  Medication changes, Labs and Tests ordered today are listed in the Patient Instructions below. Patient Instructions  Medication Instructions:  Your physician has recommended you make the following change in your medication:  Increase Toprol XL to 150 mg ( 1 and 1/2 Tablet)  Daily    Labwork: NONE  Testing/Procedures: NONE  Follow-Up: Your physician recommends that you schedule a follow-up appointment in: 3-4 Weeks with Dr. Dwana Curd    Any Other Special Instructions Will Be Listed Below (If Applicable).     If you need a refill on your cardiac medications before your next appointment, please call your pharmacy.      Sumner Boast, PA-C  11/29/2016 12:01 PM    [facility] [address], [place], [place]  [address] Phone: [phone]; Fax: [phone]

## 2016-11-29 ENCOUNTER — Ambulatory Visit (INDEPENDENT_AMBULATORY_CARE_PROVIDER_SITE_OTHER): Payer: Medicare Other | Admitting: Physician Assistant

## 2016-11-29 ENCOUNTER — Encounter: Payer: Self-pay | Admitting: Physician Assistant

## 2016-11-29 VITALS — BP 128/68 | HR 99 | Ht 61.0 in | Wt 152.0 lb

## 2016-11-29 DIAGNOSIS — I5042 Chronic combined systolic (congestive) and diastolic (congestive) heart failure: Secondary | ICD-10-CM

## 2016-11-29 DIAGNOSIS — I10 Essential (primary) hypertension: Secondary | ICD-10-CM

## 2016-11-29 DIAGNOSIS — I2510 Atherosclerotic heart disease of native coronary artery without angina pectoris: Secondary | ICD-10-CM | POA: Diagnosis not present

## 2016-11-29 DIAGNOSIS — R0602 Shortness of breath: Secondary | ICD-10-CM

## 2016-11-29 DIAGNOSIS — I480 Paroxysmal atrial fibrillation: Secondary | ICD-10-CM

## 2016-11-29 DIAGNOSIS — D649 Anemia, unspecified: Secondary | ICD-10-CM | POA: Diagnosis not present

## 2016-11-29 DIAGNOSIS — I208 Other forms of angina pectoris: Secondary | ICD-10-CM

## 2016-11-29 MED ORDER — METOPROLOL SUCCINATE ER 100 MG PO TB24: ORAL_TABLET | ORAL | 6 refills | Status: DC

## 2016-11-29 NOTE — Patient Instructions (Signed)
Medication Instructions:  Your physician has recommended you make the following change in your medication:  Increase Toprol XL to 150 mg ( 1 and 1/2 Tablet)  Daily    Labwork: NONE  Testing/Procedures: NONE  Follow-Up: Your physician recommends that you schedule a follow-up appointment in: 3-4 Weeks with Dr. Dwana Curd    Any Other Special Instructions Will Be Listed Below (If Applicable).     If you need a refill on your cardiac medications before your next appointment, please call your pharmacy.

## 2016-12-19 DIAGNOSIS — Z6830 Body mass index (BMI) 30.0-30.9, adult: Secondary | ICD-10-CM | POA: Diagnosis not present

## 2016-12-19 DIAGNOSIS — Z6828 Body mass index (BMI) 28.0-28.9, adult: Secondary | ICD-10-CM | POA: Diagnosis not present

## 2016-12-19 DIAGNOSIS — R590 Localized enlarged lymph nodes: Secondary | ICD-10-CM | POA: Diagnosis not present

## 2016-12-19 DIAGNOSIS — M545 Low back pain: Secondary | ICD-10-CM | POA: Diagnosis not present

## 2016-12-19 DIAGNOSIS — F329 Major depressive disorder, single episode, unspecified: Secondary | ICD-10-CM | POA: Diagnosis not present

## 2016-12-19 DIAGNOSIS — I482 Chronic atrial fibrillation: Secondary | ICD-10-CM | POA: Diagnosis not present

## 2016-12-19 DIAGNOSIS — R5383 Other fatigue: Secondary | ICD-10-CM | POA: Diagnosis not present

## 2016-12-19 DIAGNOSIS — F419 Anxiety disorder, unspecified: Secondary | ICD-10-CM | POA: Diagnosis not present

## 2016-12-19 DIAGNOSIS — I10 Essential (primary) hypertension: Secondary | ICD-10-CM | POA: Diagnosis not present

## 2016-12-19 DIAGNOSIS — J440 Chronic obstructive pulmonary disease with acute lower respiratory infection: Secondary | ICD-10-CM | POA: Diagnosis not present

## 2016-12-19 DIAGNOSIS — Z1212 Encounter for screening for malignant neoplasm of rectum: Secondary | ICD-10-CM | POA: Diagnosis not present

## 2016-12-19 DIAGNOSIS — J069 Acute upper respiratory infection, unspecified: Secondary | ICD-10-CM | POA: Diagnosis not present

## 2016-12-19 DIAGNOSIS — I5033 Acute on chronic diastolic (congestive) heart failure: Secondary | ICD-10-CM | POA: Diagnosis not present

## 2016-12-19 DIAGNOSIS — F411 Generalized anxiety disorder: Secondary | ICD-10-CM | POA: Diagnosis not present

## 2016-12-19 DIAGNOSIS — D649 Anemia, unspecified: Secondary | ICD-10-CM | POA: Diagnosis not present

## 2016-12-19 DIAGNOSIS — R079 Chest pain, unspecified: Secondary | ICD-10-CM | POA: Diagnosis not present

## 2016-12-19 DIAGNOSIS — R300 Dysuria: Secondary | ICD-10-CM | POA: Diagnosis not present

## 2016-12-19 DIAGNOSIS — J449 Chronic obstructive pulmonary disease, unspecified: Secondary | ICD-10-CM | POA: Diagnosis not present

## 2016-12-19 DIAGNOSIS — R1084 Generalized abdominal pain: Secondary | ICD-10-CM | POA: Diagnosis not present

## 2016-12-26 ENCOUNTER — Encounter: Payer: Self-pay | Admitting: Nurse Practitioner

## 2017-01-08 ENCOUNTER — Ambulatory Visit (INDEPENDENT_AMBULATORY_CARE_PROVIDER_SITE_OTHER): Payer: Medicare Other | Admitting: Cardiovascular Disease

## 2017-01-08 ENCOUNTER — Encounter: Payer: Self-pay | Admitting: Cardiovascular Disease

## 2017-01-08 VITALS — BP 134/72 | HR 98 | Ht 61.0 in | Wt 150.0 lb

## 2017-01-08 DIAGNOSIS — D649 Anemia, unspecified: Secondary | ICD-10-CM

## 2017-01-08 DIAGNOSIS — I5042 Chronic combined systolic (congestive) and diastolic (congestive) heart failure: Secondary | ICD-10-CM | POA: Diagnosis not present

## 2017-01-08 DIAGNOSIS — I209 Angina pectoris, unspecified: Secondary | ICD-10-CM | POA: Diagnosis not present

## 2017-01-08 DIAGNOSIS — I208 Other forms of angina pectoris: Secondary | ICD-10-CM | POA: Diagnosis not present

## 2017-01-08 DIAGNOSIS — I482 Chronic atrial fibrillation, unspecified: Secondary | ICD-10-CM

## 2017-01-08 DIAGNOSIS — I25118 Atherosclerotic heart disease of native coronary artery with other forms of angina pectoris: Secondary | ICD-10-CM | POA: Diagnosis not present

## 2017-01-08 DIAGNOSIS — Z955 Presence of coronary angioplasty implant and graft: Secondary | ICD-10-CM

## 2017-01-08 DIAGNOSIS — R0602 Shortness of breath: Secondary | ICD-10-CM | POA: Diagnosis not present

## 2017-01-08 NOTE — Progress Notes (Signed)
SUBJECTIVE: The patient presents for routine follow-up. She has chronic systolic heart failure and atrial fibrillation. Echocardiogram on 04/17/16 demonstrated moderately reduced left ventricle systolic function, LVEF 62-83%.  She underwent percutaneous coronary intervention with drug-eluting stent to the left circumflex coronary artery in January 2015.  Coronary angiography 12/16/15 showed 20% stenosis in the proximal LAD and in the inferior branch of the first diagonal with a widely patent previously placed circumflex stent. RCA was small and nondominant with 80% ostial stenosis. Medical therapy was recommended.  She saw Gerrianne Scale PA-C on 11/29/16. Metoprolol succinate was increased to 150 mg daily.  She complains of exertional dyspnea and exertional chest pressure radiating down the left arm. She has shortness of breath when doing the dishes.  She said she had labs checked 2 weeks ago and was told she was anemic. I have asked my nurse to contact her PCPs office like and obtain a copy of these labs for personal review.  She showed me a letter from her PCP demonstrating that she has Hemoccult positive stool and is scheduled to see GI on October 1.    Review of Systems: As per "subjective", otherwise negative.  Allergies  Allergen Reactions  . Cephalosporins Diarrhea and Nausea Only    Lightheaded  . Levaquin [Levofloxacin In D5w] Swelling  . Macrodantin [Nitrofurantoin Macrocrystal] Swelling  . Phenothiazines Anaphylaxis and Hives  . Polysorbate Anaphylaxis  . Prednisone Shortness Of Breath  . Buspirone Itching  . Cardura [Doxazosin Mesylate] Itching  . Codeine Itching  . Acyclovir And Related Itching    Redness of skin  . Prochlorperazine Other (See Comments)    "Upset stomach"  . Ranexa [Ranolazine]     Severe drop in BP  . Atorvastatin Hives    Cramping; tolerates Crestor ok  . Ofloxacin Rash  . Other Itching and Rash    "WOOL"= make skin look like it has been burned   . Penicillins Other (See Comments)    Causes redness all over. Has patient had a PCN reaction causing immediate rash, facial/tongue/throat swelling, SOB or lightheadedness with hypotension: No Has patient had a PCN reaction causing severe rash involving mucus membranes or skin necrosis: No Has patient had a PCN reaction that required hospitalization No Has patient had a PCN reaction occurring within the last 10 years: No If all of the above answers are "NO", then may proceed with Cephalosporin use.   . Pimozide Hives and Itching    Current Outpatient Prescriptions  Medication Sig Dispense Refill  . acetaminophen (TYLENOL) 500 MG tablet Take 500 mg by mouth daily as needed for headache.     . ALPRAZolam (XANAX) 0.25 MG tablet Take 0.25 mg by mouth 2 (two) times daily as needed.  0  . bisacodyl (DULCOLAX) 5 MG EC tablet Take 1 tablet (5 mg total) by mouth daily as needed for moderate constipation. 30 tablet 0  . DULoxetine (CYMBALTA) 60 MG capsule Take 60 mg by mouth at bedtime.     [name] Kitchen ELIQUIS 5 MG TABS tablet TAKE ONE TABLET BY MOUTH TWICE DAILY. 180 tablet 3  . fluticasone (FLONASE) 50 MCG/ACT nasal spray Place 2 sprays into both nostrils daily.     . furosemide (LASIX) 40 MG tablet Take 40 mg by mouth daily.    [name] Kitchen gabapentin (NEURONTIN) 300 MG capsule Take 1 capsule (300 mg total) by mouth 3 (three) times daily. 90 capsule 2  . hydrocortisone (ANUSOL-HC) 25 MG suppository Place 1 suppository (25 mg total)  rectally every 12 (twelve) hours. 12 suppository 1  . ipratropium-albuterol (DUONEB) 0.5-2.5 (3) MG/3ML SOLN Take 3 mLs by nebulization every 6 (six) hours. 360 mL 0  . Magnesium 400 MG TABS Take 400 mg by mouth daily. 90 tablet 3  . methocarbamol (ROBAXIN) 500 MG tablet Take 500 mg by mouth 2 (two) times daily as needed for muscle spasms.    . metoprolol succinate (TOPROL-XL) 100 MG 24 hr tablet Take 1 and 1/2 Tablet Daily with or immediately following a meal. 90 tablet 6  . Multiple  Vitamin (MULTIVITAMIN WITH MINERALS) TABS tablet Take 1 tablet by mouth daily with lunch. Centrum    . nitroGLYCERIN (NITROSTAT) 0.4 MG SL tablet Place 0.4 mg under the tongue every 5 (five) minutes as needed for chest pain. Reported on 08/04/2015    . oxyCODONE (ROXICODONE) 5 MG immediate release tablet Take one tablet by mouth twice daily as needed for pain 60 tablet 0  . pantoprazole (PROTONIX) 40 MG tablet Take 40 mg by mouth daily.     . potassium chloride (K-DUR,KLOR-CON) 10 MEQ tablet TAKE TWO (2) TABLETS BY MOUTH DAILY. 180 tablet 3  . potassium chloride SA (K-DUR,KLOR-CON) 20 MEQ tablet Take 20 mEq by mouth daily.      No current facility-administered medications for this visit.     Past Medical History:  Diagnosis Date  . Anxiety   . Arthritis   . Atrial fibrillation ([place])   . Bursitis    left shoulder  . Cataract   . CHF (congestive heart failure) (H[facility])    EF 55-60%  . Complication of anesthesia   . COPD (chronic obstructive pulmonary disease) ([place])   . Coronary atherosclerosis of native coronary artery    a. DES to CX, moderately severe stenosis RCA, mild stenosis LAD 04/2013  . Depression   . Dysphagia, unspecified(787.20)   . GERD (gastroesophageal reflux disease)    Hx Schatzki's ring, multiple EGD/ED last 01/06/2004  . Headache   . Heart disease   . Heart murmur    'a littel'  . History of anemia   . HTN (hypertension)    Hx of it  . Hyperlipemia   . Hyperlipidemia   . Internal hemorrhoids without mention of complication   . MI (myocardial infarction) ([place]) 2006  . Microscopic colitis 2003  . Other and unspecified hyperlipidemia   . Panic disorder without agoraphobia   . Paresthesia    hands, feet  . Pneumonia 12/2011  . PONV (postoperative nausea and vomiting)    'a little nausea"  . PVD (peripheral vascular disease) ([place])   . S/P colonoscopy 09/27/2001   internal hemorrhoids, desc colon inflam polyp, SB BX-chronic duodenitis, colitis  . Shortness of  breath   . Sleep disorder    obstructive  . Thyroid disease    recent abnl TSH per pt    Past Surgical History:  Procedure Laterality Date  . ABDOMINAL HYSTERECTOMY    . ANTERIOR AND POSTERIOR REPAIR     with resection of vagina  . ANTERIOR LAT LUMBAR FUSION N/A 08/01/2016   Procedure: Lumbar Two-Lumbar Five Transpsoas lateral interbody fusion with Lumbar Two-Three lateral plate fixation;  Surgeon: Kevan Ny Ditty, MD;  Location: [facility];  Service: Neurosurgery;  Laterality: N/A;  L2-5 Transpsoas lateral interbody fusion with L2-3 lateral plate fixation  . APPENDECTOMY    . BACK SURGERY    . BIOPSY  07/05/2015   Procedure: BIOPSY;  Surgeon: Daneil Dolin, MD;  [facility];  Service: Endoscopy;;  gastric polyp biopsy, ascending colon biopsy  . BLADDER SUSPENSION  11/09/2011   Procedure: TRANSVAGINAL TAPE (TVT) PROCEDURE;  Surgeon: Marissa Nestle, MD;  Location: [facility];  Service: Urology;  Laterality: N/A;  . bladder tack  06/2010  . BREAST LUMPECTOMY  1998   left, benign  . CARDIAC CATHETERIZATION    . CARDIAC CATHETERIZATION    . CARDIAC CATHETERIZATION N/A 12/16/2015   Procedure: Left Heart Cath and Coronary Angiography;  Surgeon: Troy Sine, MD;  Location: [facility];  Service: Cardiovascular;  Laterality: N/A;  . [place]   left  . CHOLECYSTECTOMY  1998  . COLONOSCOPY  03/16/2011   multiple hyperplastic colon polyps, sigmoid diverticulosis, melanosis coli  . COLONOSCOPY WITH PROPOFOL N/A 07/05/2015   RMR:one 5 mm polyp in descending colon  . CORONARY ANGIOPLASTY WITH STENT PLACEMENT    . ESOPHAGEAL DILATION N/A 07/05/2015   Procedure: ESOPHAGEAL DILATION;  Surgeon: Daneil Dolin, MD;  [facility];  Service: Endoscopy;  Laterality: N/A;  . ESOPHAGOGASTRODUODENOSCOPY (EGD) WITH PROPOFOL N/A 07/05/2015   [id number]  . JOINT REPLACEMENT Right 2007   right knee  . left hand surgery    . LEFT HEART CATHETERIZATION WITH  CORONARY ANGIOGRAM N/A 05/14/2013   Procedure: LEFT HEART CATHETERIZATION WITH CORONARY ANGIOGRAM;  Surgeon: Blane Ohara, MD;  Location: [facility];  Service: Cardiovascular;  Laterality: N/A;  . left rotator cuff surgery    . LUMBAR LAMINECTOMY/DECOMPRESSION MICRODISCECTOMY N/A 10/11/2012   Procedure: LUMBAR LAMINECTOMY/DECOMPRESSION MICRODISCECTOMY 2 LEVELS;  Surgeon: Floyce Stakes, MD;  Location: [place] [facility];  Service: Neurosurgery;  Laterality: N/A;  L3-4 L4-5 Laminectomy  . LUMBAR WOUND DEBRIDEMENT N/A 09/27/2015   Procedure: Exploration of Lumbar Wound w/ Repair CSF Leak/Lumbar Drain Placement;  Surgeon: Leeroy Cha, MD;  Location: [place] [facility];  Service: Neurosurgery;  Laterality: N/A;  . MALONEY DILATION  03/16/2011   Gastritis. No H.pylori on bx. 63F maloney dilation with disruption of  occult cevical esophageal web  . NASAL SINUS SURGERY    . right knee replacement  2007  . right leg benign tumor    . SHOULDER SURGERY Left   . TONSILLECTOMY    . unspecified area, hysterectomy  1972   partial    Social History   Social History  . Marital status: Divorced    Spouse name: N/A  . Number of children: 5  . Years of education: N/A   Occupational History  . retired    Social History Main Topics  . Smoking status: Former Smoker    Packs/day: 1.00    Years: 64.00    Types: Cigarettes    Start date: 12/24/1947    Quit date: 11/17/2001  . Smokeless tobacco: Never Used     Comment: Quit smoking in 2003  . Alcohol use No  . Drug use: No  . Sexual activity: No   Other Topics Concern  . Not on file   Social History Narrative   Divorced.   Sister had colon perforation & died from complications in [place], [place]     Vitals:   01/08/17 1342  BP: 134/72  Pulse: 98  SpO2: 97%  Weight: 150 lb (68 kg)  Height: 5\' 1"  (1.549 m)    Wt Readings from Last 3 Encounters:  01/08/17 150 lb (68 kg)  11/29/16 152 lb (68.9 kg)  09/30/16 151 lb 14.4 oz (68.9 kg)     PHYSICAL  EXAM General: NAD HEENT: Normal. Neck: No JVD, no thyromegaly. Lungs: Clear to auscultation bilaterally with normal respiratory effort. CV: Nondisplaced PMI.  Regular rate and irregular rhythm, normal S1/S2, no S3, no murmur. No pretibial or periankle edema.     Abdomen: Soft, nontender, no distention.  Neurologic: Alert and oriented.  Psych: Normal affect. Skin: Normal. Musculoskeletal: No gross deformities.    ECG: Most recent ECG reviewed.   Labs: Lab Results  Component Value Date/Time   K 4.2 09/30/2016 12:27 PM   BUN 12 09/30/2016 12:27 PM   CREATININE 1.30 (H) 09/30/2016 12:27 PM   CREATININE 1.17 (H) 02/13/2011 03:26 PM   ALT 15 04/15/2016 02:23 PM   TSH 3.000 04/15/2016 06:14 AM   TSH 2.770 11/25/2013 02:14 PM   HGB 9.8 (L) 09/30/2016 12:27 PM     Lipids: Lab Results  Component Value Date/Time   LDLCALC 103 (H) 12/16/2015 04:35 AM   CHOL 177 12/16/2015 04:35 AM   TRIG 197 (H) 12/16/2015 04:35 AM   HDL 35 (L) 12/16/2015 04:35 AM       ASSESSMENT AND PLAN:  1. CAD with DES to LCx with chest pressure: Symptoms may be from underlying anemia. I will obtain a copy of CBC. For now, I will continue Toprol-XL at 150 mg daily. Given small nondominant RCA, medical therapy is warranted. Statin intolerant. She is no longer on aspirin.  2. Essential HTN: Controlled. No changes.  3. Chronic systolic heart failure, EF 30-35% (previously 55%): Euvolemic. Takes Lasix 40 mg daily. GFR 40 cc/min 08/31/16. I will obtain a copy of most recent BMET. Will hold off on ACEI/ARB. 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   4. Hyperlipidemia: Statin intolerant.  5. Aortic regurgitation: Will monitor clinically and with surveillance echocardiograms. Mild at present.  6. Atrial fibrillation: Heart rate is controlled on Toprol-XL 150 mg daily. Continue Eliquis. If symptoms of chest pressure and exertional dyspnea persist in spite of adequate Hgb level, I may consider elective  cardioversion.  7. Iron deficiency anemia: She is scheduled to see GI on October 1.    Disposition: Follow up 2 months.   Kate Sable, M.D., F.A.C.C.

## 2017-01-08 NOTE — Patient Instructions (Signed)
Your physician recommends that you schedule a follow-up appointment in: 2 months with Dr Koneswaran    Your physician recommends that you continue on your current medications as directed. Please refer to the Current Medication list given to you today.     If you need a refill on your cardiac medications before your next appointment, please call your pharmacy.     Thank you for choosing [facility] !         

## 2017-01-15 ENCOUNTER — Ambulatory Visit (INDEPENDENT_AMBULATORY_CARE_PROVIDER_SITE_OTHER): Payer: Medicare Other | Admitting: Nurse Practitioner

## 2017-01-15 ENCOUNTER — Encounter: Payer: Self-pay | Admitting: Nurse Practitioner

## 2017-01-15 VITALS — BP 131/70 | HR 103 | Temp 96.6°F | Ht 61.0 in | Wt 150.4 lb

## 2017-01-15 DIAGNOSIS — I208 Other forms of angina pectoris: Secondary | ICD-10-CM | POA: Diagnosis not present

## 2017-01-15 DIAGNOSIS — K625 Hemorrhage of anus and rectum: Secondary | ICD-10-CM | POA: Diagnosis not present

## 2017-01-15 DIAGNOSIS — R1084 Generalized abdominal pain: Secondary | ICD-10-CM

## 2017-01-15 NOTE — Patient Instructions (Signed)
1. Have your lab tests drawn when you're able to. 2. Continue using Anusol cream twice a day for up to 10 days at a time for hemorrhoid symptoms and/or rectal bleeding. 3. Depending on how you do with her symptoms and the results of your blood test we may need to refer you to a hematologist (blood doctor). 4. Return for follow-up in 2 months.

## 2017-01-15 NOTE — Assessment & Plan Note (Signed)
Patient has some mild generalized and lower abdominal pain which is intermittent, crampy. Her pain appears to be chronic in nature. MRI of the abdomen was completed earlier this year and is reassuring. Continue to monitor, notify us of any worsening symptoms. We can reevaluate at her follow-up visit in 2 months.

## 2017-01-15 NOTE — Assessment & Plan Note (Signed)
The patient notes rectal bleeding. Has a history of hemorrhoids and is having hemorrhoid symptoms as well. Mild anemia at last CBC about 1 month ago. She is on Eliquis. Recent colonoscopy and endoscopy about one year ago which is reassuring. At this point I'll recheck her CBC, ending her symptom progression and hemoglobin on repeat labs may need referral to hematology. Likely no need to repeat endoscopic evaluation at this time. Return for follow-up in 2 months.

## 2017-01-15 NOTE — Progress Notes (Signed)
Referring Provider: Tawni Carnes, PA-C Primary Care Physician:  Tawni Carnes, PA-C Primary GI:  Dr. Gala Romney  Chief Complaint  Patient presents with  . Rectal Bleeding    bright red, was heme+    HPI:   [name] is a 83 y.o. female who presents on referral for rectal bleeding. Patient was last seen in our office 04/26/2016 for hemorrhoids, abdominal pain, weakness, nausea without vomiting. At that time she was having weakness, dehydration and felt better after ER IV fluids. Some constipation. Typically has loose stools but can be difficult to pass, swollen hemorrhoids at home Anusol which doesn't feel like it has helped. Feels her hemorrhoid is internal. Family encouraging food and fluids, feels like she was improving. Felt she may have had a "stomach bug." Recommended cautious observation with abdominal pain and nausea due to potential gastroenteritis. Recommended continue danazol internal application or Preparation H suppositories. Chills of Anusol suppositories are due to expensive.  Last colonoscopy completed 07/05/2015 for chronic diarrhea which found single 5 mm polyp in the descending colon, otherwise normal. Recommend continue present medications. Surgical pathology found the polyp to be tubular adenoma. Random colon biopsies found to be benign.. EGD completed the same day for esophageal dysphagia found normal esophagus status post dilation, multiple gastric polyps were biopsied, otherwise normal. Surgical pathology found the gastric polyps to be fundic gland polyp. Recommend no further routine colonoscopies unless symptoms develop.  Today she states she's not doing very well. Has CHF, recent back surgery which she feels isn't helping, and multiple other complaints. Saw PCP for weakness and states "I was really anemic." Diarrhea has improved. Is seeing some hematochezia minimal in the toilet, mostly on the toilet tissue. Bleeding started about 2 months ago, has hemorrhoids, has  had hemorrhoid symptoms including irritation. Takes MiraLAX to keep stools soft, uses rectal cream (Anusol) when hemorrhoids get really bad. Has lower abdominal pain described as crampy, intermittent, has been occurring for about a year. No improvement after a bowel movement. Has a bowel movement about every other day, a lot of times soft but don't pass easily; occasionally watery. Denies N/V, has dark stools on iron. Denies fever, chills, unintentional weight loss (fluctuates between 146 - 150 lbs).   She is on Eliquis.  Past Medical History:  Diagnosis Date  . Anxiety   . Arthritis   . Atrial fibrillation ([place])   . Bursitis    left shoulder  . Cataract   . CHF (congestive heart failure) (H[facility])    EF 55-60%  . Complication of anesthesia   . COPD (chronic obstructive pulmonary disease) ([place])   . Coronary atherosclerosis of native coronary artery    a. DES to CX, moderately severe stenosis RCA, mild stenosis LAD 04/2013  . Depression   . Dysphagia, unspecified(787.20)   . GERD (gastroesophageal reflux disease)    Hx Schatzki's ring, multiple EGD/ED last 01/06/2004  . Headache   . Heart disease   . Heart murmur    'a littel'  . History of anemia   . HTN (hypertension)    Hx of it  . Hyperlipemia   . Hyperlipidemia   . Internal hemorrhoids without mention of complication   . MI (myocardial infarction) ([place]) 2006  . Microscopic colitis 2003  . Other and unspecified hyperlipidemia   . Panic disorder without agoraphobia   . Paresthesia    hands, feet  . Pneumonia 12/2011  . PONV (postoperative nausea and vomiting)    'a little nausea"  .  PVD (peripheral vascular disease) ([place])   . S/P colonoscopy 09/27/2001   internal hemorrhoids, desc colon inflam polyp, SB BX-chronic duodenitis, colitis  . Shortness of breath   . Sleep disorder    obstructive  . Thyroid disease    recent abnl TSH per pt    Past Surgical History:  Procedure Laterality Date  . ABDOMINAL HYSTERECTOMY    .  ANTERIOR AND POSTERIOR REPAIR     with resection of vagina  . ANTERIOR LAT LUMBAR FUSION N/A 08/01/2016   Procedure: Lumbar Two-Lumbar Five Transpsoas lateral interbody fusion with Lumbar Two-Three lateral plate fixation;  Surgeon: Kevan Ny Ditty, MD;  Location: [facility];  Service: Neurosurgery;  Laterality: N/A;  L2-5 Transpsoas lateral interbody fusion with L2-3 lateral plate fixation  . APPENDECTOMY    . BACK SURGERY    . BIOPSY  07/05/2015   Procedure: BIOPSY;  Surgeon: Daneil Dolin, MD;  [facility];  Service: Endoscopy;;  gastric polyp biopsy, ascending colon biopsy  . BLADDER SUSPENSION  11/09/2011   Procedure: TRANSVAGINAL TAPE (TVT) PROCEDURE;  Surgeon: Marissa Nestle, MD;  Location: [facility];  Service: Urology;  Laterality: N/A;  . bladder tack  06/2010  . BREAST LUMPECTOMY  1998   left, benign  . CARDIAC CATHETERIZATION    . CARDIAC CATHETERIZATION    . CARDIAC CATHETERIZATION N/A 12/16/2015   Procedure: Left Heart Cath and Coronary Angiography;  Surgeon: Troy Sine, MD;  Location: [facility];  Service: Cardiovascular;  Laterality: N/A;  . [place]   left  . CHOLECYSTECTOMY  1998  . COLONOSCOPY  03/16/2011   multiple hyperplastic colon polyps, sigmoid diverticulosis, melanosis coli  . COLONOSCOPY WITH PROPOFOL N/A 07/05/2015   RMR:one 5 mm polyp in descending colon  . CORONARY ANGIOPLASTY WITH STENT PLACEMENT    . ESOPHAGEAL DILATION N/A 07/05/2015   Procedure: ESOPHAGEAL DILATION;  Surgeon: Daneil Dolin, MD;  [facility];  Service: Endoscopy;  Laterality: N/A;  . ESOPHAGOGASTRODUODENOSCOPY (EGD) WITH PROPOFOL N/A 07/05/2015   TGG:YIRSWN  . JOINT REPLACEMENT Right 2007   right knee  . left hand surgery    . LEFT HEART CATHETERIZATION WITH CORONARY ANGIOGRAM N/A 05/14/2013   Procedure: LEFT HEART CATHETERIZATION WITH CORONARY ANGIOGRAM;  Surgeon: Blane Ohara, MD;  Location[facility];  Service: Cardiovascular;   Laterality: N/A;  . left rotator cuff surgery    . LUMBAR LAMINECTOMY/DECOMPRESSION MICRODISCECTOMY N/A 10/11/2012   Procedure: LUMBAR LAMINECTOMY/DECOMPRESSION MICRODISCECTOMY 2 LEVELS;  Surgeon: Floyce Stakes, MD;  Location: [facility];  Service: Neurosurgery;  Laterality: N/A;  L3-4 L4-5 Laminectomy  . LUMBAR WOUND DEBRIDEMENT N/A 09/27/2015   Procedure: Exploration of Lumbar Wound w/ Repair CSF Leak/Lumbar Drain Placement;  Surgeon: Leeroy Cha, MD;  Location: [facility];  Service: Neurosurgery;  Laterality: N/A;  . MALONEY DILATION  03/16/2011   Gastritis. No H.pylori on bx. 93F maloney dilation with disruption of  occult cevical esophageal web  . NASAL SINUS SURGERY    . right knee replacement  2007  . right leg benign tumor    . SHOULDER SURGERY Left   . TONSILLECTOMY    . unspecified area, hysterectomy  1972   partial    Current Outpatient Prescriptions  Medication Sig Dispense Refill  . acetaminophen (TYLENOL) 500 MG tablet Take 500 mg by mouth daily as needed for headache.     . ALPRAZolam (XANAX) 0.25 MG tablet Take 0.25 mg by mouth  2 (two) times daily as needed.  0  . bisacodyl (DULCOLAX) 5 MG EC tablet Take 1 tablet (5 mg total) by mouth daily as needed for moderate constipation. 30 tablet 0  . DULoxetine (CYMBALTA) 60 MG capsule Take 60 mg by mouth at bedtime.     [name] Kitchen ELIQUIS 5 MG TABS tablet TAKE ONE TABLET BY MOUTH TWICE DAILY. 180 tablet 3  . fluticasone (FLONASE) 50 MCG/ACT nasal spray Place 2 sprays into both nostrils daily.     . furosemide (LASIX) 40 MG tablet Take 40 mg by mouth daily.    [name] Kitchen gabapentin (NEURONTIN) 300 MG capsule Take 1 capsule (300 mg total) by mouth 3 (three) times daily. 90 capsule 2  . hydrocortisone (ANUSOL-HC) 25 MG suppository Place 1 suppository (25 mg total) rectally every 12 (twelve) hours. 12 suppository 1  . ipratropium-albuterol (DUONEB) 0.5-2.5 (3) MG/3ML SOLN Take 3 mLs by nebulization every 6 (six) hours. 360 mL 0  . Magnesium  400 MG TABS Take 400 mg by mouth daily. 90 tablet 3  . methocarbamol (ROBAXIN) 500 MG tablet Take 500 mg by mouth 2 (two) times daily as needed for muscle spasms.    . metoprolol succinate (TOPROL-XL) 100 MG 24 hr tablet Take 1 and 1/2 Tablet Daily with or immediately following a meal. 90 tablet 6  . Multiple Vitamin (MULTIVITAMIN WITH MINERALS) TABS tablet Take 1 tablet by mouth daily with lunch. Centrum    . nitroGLYCERIN (NITROSTAT) 0.4 MG SL tablet Place 0.4 mg under the tongue every 5 (five) minutes as needed for chest pain. Reported on 08/04/2015    . oxyCODONE (ROXICODONE) 5 MG immediate release tablet Take one tablet by mouth twice daily as needed for pain 60 tablet 0  . pantoprazole (PROTONIX) 40 MG tablet Take 40 mg by mouth daily.     . potassium chloride (K-DUR,KLOR-CON) 10 MEQ tablet TAKE TWO (2) TABLETS BY MOUTH DAILY. 180 tablet 3  . potassium chloride SA (K-DUR,KLOR-CON) 20 MEQ tablet Take 20 mEq by mouth daily.      No current facility-administered medications for this visit.     Allergies as of 01/15/2017 - Review Complete 01/15/2017  Allergen Reaction Noted  . Cephalosporins Diarrhea and Nausea Only 08/04/2015  . Levaquin [levofloxacin in d5w] Swelling 07/06/2014  . Macrodantin [nitrofurantoin macrocrystal] Swelling 07/06/2014  . Phenothiazines Anaphylaxis and Hives 08/16/2011  . Polysorbate Anaphylaxis 08/16/2011  . Prednisone Shortness Of Breath   . Buspirone Itching 07/06/2014  . Cardura [doxazosin mesylate] Itching 07/06/2014  . Codeine Itching   . Acyclovir and related Itching 10/07/2015  . Prochlorperazine Other (See Comments) 08/16/2011  . Ranexa [ranolazine]  04/06/2015  . Atorvastatin Hives 08/16/2011  . Ofloxacin Rash   . Other Itching and Rash 10/10/2012  . Penicillins Other (See Comments) 11/09/2011  . Pimozide Hives and Itching 08/16/2011    Family History  Problem Relation Age of Onset  . Stroke Mother   . Parkinson's disease Father   . Coronary  artery disease Other        family Hx-sons  . Cancer Other   . Stroke Other        family Hx  . Hypertension Other        family Hx  . Diabetes Brother   . Heart disease Son        before age 56  . Diabetes Son   . Stroke Daughter 58  . Colon cancer Neg Hx     Social History   Social History  .  Marital status: Divorced    Spouse name: N/A  . Number of children: 5  . Years of education: N/A   Occupational History  . retired    Social History Main Topics  . Smoking status: Former Smoker    Packs/day: 1.00    Years: 64.00    Types: Cigarettes    Start date: 12/24/1947    Quit date: 11/17/2001  . Smokeless tobacco: Never Used     Comment: Quit smoking in 2003  . Alcohol use No  . Drug use: No  . Sexual activity: No   Other Topics Concern  . None   Social History Narrative   Divorced.   Sister had colon perforation & died from complications in [place], [place]    Review of Systems: General: Negative for anorexia, weight loss, fever, chills, fatigue, weakness. ENT: Negative for hoarseness, difficulty swallowing. CV: Negative for chest pain, angina, palpitations, peripheral edema.  Respiratory: Negative for dyspnea at rest, cough, sputum, wheezing. Increasing dyspnea from CHF. GI: See history of present illness. GU:  Negative for dysuria, hematuria, urinary incontinence, urinary frequency, nocturnal urination.  MS: Chronic back pain.  Derm: Negative for rash or itching. Complains of extensive varicose veins. Endo: Negative for unusual weight change.  Allergy: Negative for rash or hives.   Physical Exam: BP 131/70   Pulse (!) 103   Temp (!) 96.6 F (35.9 C) (Oral)   Ht 5\' 1"  (1.549 m)   Wt 150 lb 6.4 oz (68.2 kg)   BMI 28.42 kg/m  General:   Alert and oriented. Pleasant and cooperative. Well-nourished and well-developed.  Eyes:  Without icterus, sclera clear and conjunctiva pink.  Ears:  Normal auditory acuity. Cardiovascular:  S1, S2 present without murmurs  appreciated. Extremities without clubbing or edema. Respiratory:  Clear to auscultation bilaterally. No wheezes, rales, or rhonchi. No distress.  Gastrointestinal:  +BS, soft, non-tender and non-distended. No HSM noted. No guarding or rebound. No masses appreciated.  Rectal:  Deferred  Musculoskalatal:  Symmetrical without gross deformities. Neurologic:  Alert and oriented x4;  grossly normal neurologically. Psych:  Alert and cooperative. Normal mood and affect. Heme/Lymph/Immune: No excessive bruising noted.    01/15/2017 2:14 PM   Disclaimer: This note was dictated with voice recognition software. Similar sounding words can inadvertently be transcribed and may not be corrected upon review.

## 2017-01-17 DIAGNOSIS — Z0001 Encounter for general adult medical examination with abnormal findings: Secondary | ICD-10-CM | POA: Diagnosis not present

## 2017-01-17 DIAGNOSIS — I10 Essential (primary) hypertension: Secondary | ICD-10-CM | POA: Diagnosis not present

## 2017-01-17 DIAGNOSIS — K649 Unspecified hemorrhoids: Secondary | ICD-10-CM | POA: Diagnosis not present

## 2017-01-17 DIAGNOSIS — I482 Chronic atrial fibrillation: Secondary | ICD-10-CM | POA: Diagnosis not present

## 2017-01-17 DIAGNOSIS — M545 Low back pain: Secondary | ICD-10-CM | POA: Diagnosis not present

## 2017-01-17 DIAGNOSIS — I5033 Acute on chronic diastolic (congestive) heart failure: Secondary | ICD-10-CM | POA: Diagnosis not present

## 2017-01-17 DIAGNOSIS — F419 Anxiety disorder, unspecified: Secondary | ICD-10-CM | POA: Diagnosis not present

## 2017-01-17 DIAGNOSIS — Z6828 Body mass index (BMI) 28.0-28.9, adult: Secondary | ICD-10-CM | POA: Diagnosis not present

## 2017-01-17 DIAGNOSIS — J440 Chronic obstructive pulmonary disease with acute lower respiratory infection: Secondary | ICD-10-CM | POA: Diagnosis not present

## 2017-01-17 NOTE — Progress Notes (Signed)
cc'ed to pcp °

## 2017-02-06 ENCOUNTER — Encounter: Payer: Self-pay | Admitting: Cardiovascular Disease

## 2017-02-17 ENCOUNTER — Emergency Department (HOSPITAL_COMMUNITY): Payer: Medicare Other

## 2017-02-17 ENCOUNTER — Encounter (HOSPITAL_COMMUNITY): Payer: Self-pay | Admitting: *Deleted

## 2017-02-17 ENCOUNTER — Inpatient Hospital Stay (HOSPITAL_COMMUNITY)
Admission: EM | Admit: 2017-02-17 | Discharge: 2017-02-21 | DRG: 303 | Disposition: A | Discharge status: Home / Self Care | Payer: Medicare Other | Attending: Family Medicine | Admitting: Family Medicine

## 2017-02-17 DIAGNOSIS — F329 Major depressive disorder, single episode, unspecified: Secondary | ICD-10-CM | POA: Diagnosis present

## 2017-02-17 DIAGNOSIS — I130 Hypertensive heart and chronic kidney disease with heart failure and stage 1 through stage 4 chronic kidney disease, or unspecified chronic kidney disease: Secondary | ICD-10-CM | POA: Diagnosis present

## 2017-02-17 DIAGNOSIS — I5022 Chronic systolic (congestive) heart failure: Secondary | ICD-10-CM | POA: Diagnosis present

## 2017-02-17 DIAGNOSIS — E7220 Disorder of urea cycle metabolism, unspecified: Secondary | ICD-10-CM | POA: Diagnosis present

## 2017-02-17 DIAGNOSIS — I200 Unstable angina: Secondary | ICD-10-CM

## 2017-02-17 DIAGNOSIS — Z7901 Long term (current) use of anticoagulants: Secondary | ICD-10-CM

## 2017-02-17 DIAGNOSIS — I2510 Atherosclerotic heart disease of native coronary artery without angina pectoris: Secondary | ICD-10-CM | POA: Diagnosis not present

## 2017-02-17 DIAGNOSIS — Z9049 Acquired absence of other specified parts of digestive tract: Secondary | ICD-10-CM

## 2017-02-17 DIAGNOSIS — Z888 Allergy status to other drugs, medicaments and biological substances status: Secondary | ICD-10-CM

## 2017-02-17 DIAGNOSIS — Z23 Encounter for immunization: Secondary | ICD-10-CM

## 2017-02-17 DIAGNOSIS — Z87891 Personal history of nicotine dependence: Secondary | ICD-10-CM | POA: Diagnosis not present

## 2017-02-17 DIAGNOSIS — G8929 Other chronic pain: Secondary | ICD-10-CM | POA: Diagnosis present

## 2017-02-17 DIAGNOSIS — M10071 Idiopathic gout, right ankle and foot: Secondary | ICD-10-CM | POA: Diagnosis present

## 2017-02-17 DIAGNOSIS — M7989 Other specified soft tissue disorders: Secondary | ICD-10-CM | POA: Diagnosis not present

## 2017-02-17 DIAGNOSIS — I10 Essential (primary) hypertension: Secondary | ICD-10-CM | POA: Diagnosis present

## 2017-02-17 DIAGNOSIS — I480 Paroxysmal atrial fibrillation: Secondary | ICD-10-CM | POA: Diagnosis present

## 2017-02-17 DIAGNOSIS — G253 Myoclonus: Secondary | ICD-10-CM | POA: Diagnosis present

## 2017-02-17 DIAGNOSIS — M10072 Idiopathic gout, left ankle and foot: Secondary | ICD-10-CM | POA: Diagnosis present

## 2017-02-17 DIAGNOSIS — N183 Chronic kidney disease, stage 3 unspecified: Secondary | ICD-10-CM | POA: Diagnosis present

## 2017-02-17 DIAGNOSIS — I4891 Unspecified atrial fibrillation: Secondary | ICD-10-CM | POA: Diagnosis present

## 2017-02-17 DIAGNOSIS — Z885 Allergy status to narcotic agent status: Secondary | ICD-10-CM

## 2017-02-17 DIAGNOSIS — Z981 Arthrodesis status: Secondary | ICD-10-CM

## 2017-02-17 DIAGNOSIS — M545 Low back pain, unspecified: Secondary | ICD-10-CM | POA: Diagnosis present

## 2017-02-17 DIAGNOSIS — D72829 Elevated white blood cell count, unspecified: Secondary | ICD-10-CM | POA: Diagnosis present

## 2017-02-17 DIAGNOSIS — Z881 Allergy status to other antibiotic agents status: Secondary | ICD-10-CM

## 2017-02-17 DIAGNOSIS — I25110 Atherosclerotic heart disease of native coronary artery with unstable angina pectoris: Principal | ICD-10-CM | POA: Diagnosis present

## 2017-02-17 DIAGNOSIS — M109 Gout, unspecified: Secondary | ICD-10-CM | POA: Diagnosis not present

## 2017-02-17 DIAGNOSIS — I428 Other cardiomyopathies: Secondary | ICD-10-CM | POA: Diagnosis present

## 2017-02-17 DIAGNOSIS — F418 Other specified anxiety disorders: Secondary | ICD-10-CM | POA: Diagnosis present

## 2017-02-17 DIAGNOSIS — Z880 Allergy status to penicillin: Secondary | ICD-10-CM

## 2017-02-17 DIAGNOSIS — J449 Chronic obstructive pulmonary disease, unspecified: Secondary | ICD-10-CM | POA: Diagnosis present

## 2017-02-17 DIAGNOSIS — M5440 Lumbago with sciatica, unspecified side: Secondary | ICD-10-CM | POA: Diagnosis not present

## 2017-02-17 DIAGNOSIS — R0602 Shortness of breath: Secondary | ICD-10-CM | POA: Diagnosis not present

## 2017-02-17 DIAGNOSIS — H269 Unspecified cataract: Secondary | ICD-10-CM | POA: Diagnosis present

## 2017-02-17 DIAGNOSIS — Z955 Presence of coronary angioplasty implant and graft: Secondary | ICD-10-CM

## 2017-02-17 DIAGNOSIS — Z96651 Presence of right artificial knee joint: Secondary | ICD-10-CM | POA: Diagnosis present

## 2017-02-17 DIAGNOSIS — I5042 Chronic combined systolic (congestive) and diastolic (congestive) heart failure: Secondary | ICD-10-CM | POA: Diagnosis present

## 2017-02-17 DIAGNOSIS — R079 Chest pain, unspecified: Secondary | ICD-10-CM | POA: Diagnosis present

## 2017-02-17 DIAGNOSIS — I252 Old myocardial infarction: Secondary | ICD-10-CM

## 2017-02-17 DIAGNOSIS — Z79899 Other long term (current) drug therapy: Secondary | ICD-10-CM

## 2017-02-17 DIAGNOSIS — E785 Hyperlipidemia, unspecified: Secondary | ICD-10-CM | POA: Diagnosis present

## 2017-02-17 DIAGNOSIS — K219 Gastro-esophageal reflux disease without esophagitis: Secondary | ICD-10-CM | POA: Diagnosis present

## 2017-02-17 DIAGNOSIS — I482 Chronic atrial fibrillation: Secondary | ICD-10-CM | POA: Diagnosis present

## 2017-02-17 DIAGNOSIS — G4733 Obstructive sleep apnea (adult) (pediatric): Secondary | ICD-10-CM | POA: Diagnosis present

## 2017-02-17 HISTORY — DX: Essential (primary) hypertension: I10

## 2017-02-17 HISTORY — DX: Unspecified diastolic (congestive) heart failure: I50.30

## 2017-02-17 HISTORY — DX: Gout, unspecified: M10.9

## 2017-02-17 LAB — CBC WITH DIFFERENTIAL/PLATELET
BASOS ABS: 0.1 10*3/uL (ref 0.0–0.1)
Basophils Relative: 0 %
EOS ABS: 0.1 10*3/uL (ref 0.0–0.7)
Eosinophils Relative: 1 %
HCT: 38 % (ref 36.0–46.0)
HEMOGLOBIN: 12.2 g/dL (ref 12.0–15.0)
LYMPHS ABS: 2.3 10*3/uL (ref 0.7–4.0)
Lymphocytes Relative: 18 %
MCH: 26.4 pg (ref 26.0–34.0)
MCHC: 32.1 g/dL (ref 30.0–36.0)
MCV: 82.3 fL (ref 78.0–100.0)
Monocytes Absolute: 1.2 10*3/uL — ABNORMAL HIGH (ref 0.1–1.0)
Monocytes Relative: 9 %
NEUTROS PCT: 72 %
Neutro Abs: 9.2 10*3/uL — ABNORMAL HIGH (ref 1.7–7.7)
Platelets: 441 10*3/uL — ABNORMAL HIGH (ref 150–400)
RBC: 4.62 MIL/uL (ref 3.87–5.11)
RDW: 17.3 % — ABNORMAL HIGH (ref 11.5–15.5)
WBC: 12.8 10*3/uL — AB (ref 4.0–10.5)

## 2017-02-17 LAB — URIC ACID: URIC ACID, SERUM: 9.6 mg/dL — AB (ref 2.3–6.6)

## 2017-02-17 LAB — BASIC METABOLIC PANEL
ANION GAP: 12 (ref 5–15)
BUN: 23 mg/dL — ABNORMAL HIGH (ref 6–20)
CO2: 27 mmol/L (ref 22–32)
Calcium: 9.6 mg/dL (ref 8.9–10.3)
Chloride: 96 mmol/L — ABNORMAL LOW (ref 101–111)
Creatinine, Ser: 1.29 mg/dL — ABNORMAL HIGH (ref 0.44–1.00)
GFR, EST AFRICAN AMERICAN: 43 mL/min — AB (ref >60)
GFR, EST NON AFRICAN AMERICAN: 37 mL/min — AB (ref >60)
Glucose, Bld: 137 mg/dL — ABNORMAL HIGH (ref 65–99)
POTASSIUM: 3.5 mmol/L (ref 3.5–5.1)
SODIUM: 135 mmol/L (ref 135–145)

## 2017-02-17 LAB — HEPATIC FUNCTION PANEL
ALBUMIN: 3.8 g/dL (ref 3.5–5.0)
ALT: 30 U/L (ref 14–54)
AST: 44 U/L — ABNORMAL HIGH (ref 15–41)
Alkaline Phosphatase: 285 U/L — ABNORMAL HIGH (ref 38–126)
Bilirubin, Direct: 0.1 mg/dL (ref 0.1–0.5)
Indirect Bilirubin: 0.8 mg/dL (ref 0.3–0.9)
TOTAL PROTEIN: 8.7 g/dL — AB (ref 6.5–8.1)
Total Bilirubin: 0.9 mg/dL (ref 0.3–1.2)

## 2017-02-17 LAB — TROPONIN I: Troponin I: 0.03 ng/mL (ref <0.03)

## 2017-02-17 LAB — BRAIN NATRIURETIC PEPTIDE: B NATRIURETIC PEPTIDE 5: 589 pg/mL — AB (ref 0.0–100.0)

## 2017-02-17 MED ORDER — GABAPENTIN 300 MG PO CAPS
300.0000 mg | ORAL_CAPSULE | Freq: Three times a day (TID) | ORAL | Status: DC
  Administered 2017-02-17 – 2017-02-21 (×11): 300 mg via ORAL
  Filled 2017-02-17 (×11): qty 1

## 2017-02-17 MED ORDER — ADULT MULTIVITAMIN W/MINERALS CH
1.0000 | ORAL_TABLET | Freq: Every day | ORAL | Status: DC
  Administered 2017-02-18 – 2017-02-20 (×3): 1 via ORAL
  Filled 2017-02-17 (×4): qty 1

## 2017-02-17 MED ORDER — FLUTICASONE PROPIONATE 50 MCG/ACT NA SUSP
2.0000 | Freq: Every day | NASAL | Status: DC
  Administered 2017-02-18 – 2017-02-21 (×4): 2 via NASAL
  Filled 2017-02-17 (×2): qty 16

## 2017-02-17 MED ORDER — NITROGLYCERIN 0.4 MG SL SUBL: 0.4000 mg | SUBLINGUAL_TABLET | SUBLINGUAL | Status: DC | PRN

## 2017-02-17 MED ORDER — INFLUENZA VAC SPLIT HIGH-DOSE 0.5 ML IM SUSY
0.5000 mL | PREFILLED_SYRINGE | INTRAMUSCULAR | Status: AC
  Administered 2017-02-18: 0.5 mL via INTRAMUSCULAR
  Filled 2017-02-17: qty 0.5

## 2017-02-17 MED ORDER — POTASSIUM CHLORIDE CRYS ER 20 MEQ PO TBCR
20.0000 meq | EXTENDED_RELEASE_TABLET | Freq: Two times a day (BID) | ORAL | Status: DC
  Administered 2017-02-17 – 2017-02-19 (×4): 20 meq via ORAL
  Filled 2017-02-17 (×4): qty 1

## 2017-02-17 MED ORDER — HYDROMORPHONE HCL 1 MG/ML IJ SOLN
0.5000 mg | Freq: Once | INTRAMUSCULAR | Status: AC
  Administered 2017-02-17: 0.5 mg via INTRAVENOUS
  Filled 2017-02-17: qty 1

## 2017-02-17 MED ORDER — ONDANSETRON HCL 4 MG/2ML IJ SOLN: 4.0000 mg | Freq: Four times a day (QID) | INTRAMUSCULAR | Status: DC | PRN

## 2017-02-17 MED ORDER — BISACODYL 5 MG PO TBEC: 5.0000 mg | DELAYED_RELEASE_TABLET | Freq: Every day | ORAL | Status: DC | PRN

## 2017-02-17 MED ORDER — OXYCODONE HCL 5 MG PO TABS
5.0000 mg | ORAL_TABLET | ORAL | Status: DC | PRN
  Administered 2017-02-18 – 2017-02-20 (×4): 5 mg via ORAL
  Filled 2017-02-17 (×4): qty 1

## 2017-02-17 MED ORDER — COLCHICINE 0.6 MG PO TABS
0.6000 mg | ORAL_TABLET | Freq: Three times a day (TID) | ORAL | Status: AC
  Administered 2017-02-17 – 2017-02-18 (×3): 0.6 mg via ORAL
  Filled 2017-02-17 (×3): qty 1

## 2017-02-17 MED ORDER — ASPIRIN 81 MG PO CHEW
324.0000 mg | CHEWABLE_TABLET | ORAL | Status: AC
  Administered 2017-02-17: 324 mg via ORAL
  Filled 2017-02-17: qty 4

## 2017-02-17 MED ORDER — ALPRAZOLAM 0.5 MG PO TABS: 0.2500 mg | ORAL_TABLET | Freq: Two times a day (BID) | ORAL | Status: DC | PRN

## 2017-02-17 MED ORDER — COLCHICINE 0.6 MG PO TABS
0.6000 mg | ORAL_TABLET | Freq: Every day | ORAL | Status: DC
  Administered 2017-02-19 – 2017-02-21 (×3): 0.6 mg via ORAL
  Filled 2017-02-17 (×3): qty 1

## 2017-02-17 MED ORDER — METHOCARBAMOL 500 MG PO TABS
500.0000 mg | ORAL_TABLET | Freq: Two times a day (BID) | ORAL | Status: DC | PRN
  Filled 2017-02-17: qty 1

## 2017-02-17 MED ORDER — ONDANSETRON HCL 4 MG/2ML IJ SOLN
4.0000 mg | Freq: Once | INTRAMUSCULAR | Status: AC
  Administered 2017-02-17: 4 mg via INTRAVENOUS
  Filled 2017-02-17: qty 2

## 2017-02-17 MED ORDER — METOPROLOL SUCCINATE ER 50 MG PO TB24
150.0000 mg | ORAL_TABLET | Freq: Every day | ORAL | Status: DC
  Filled 2017-02-17 (×2): qty 1

## 2017-02-17 MED ORDER — FUROSEMIDE 40 MG PO TABS
40.0000 mg | ORAL_TABLET | Freq: Every day | ORAL | Status: DC
  Administered 2017-02-18 – 2017-02-21 (×4): 40 mg via ORAL
  Filled 2017-02-17 (×4): qty 1

## 2017-02-17 MED ORDER — IPRATROPIUM-ALBUTEROL 0.5-2.5 (3) MG/3ML IN SOLN
3.0000 mL | Freq: Four times a day (QID) | RESPIRATORY_TRACT | Status: DC
  Administered 2017-02-17: 3 mL via RESPIRATORY_TRACT
  Filled 2017-02-17: qty 3

## 2017-02-17 MED ORDER — ACETAMINOPHEN 325 MG PO TABS: 650.0000 mg | ORAL_TABLET | ORAL | Status: DC | PRN

## 2017-02-17 MED ORDER — IOPAMIDOL (ISOVUE-300) INJECTION 61%
60.0000 mL | Freq: Once | INTRAVENOUS | Status: AC | PRN
  Administered 2017-02-17: 60 mL via INTRAVENOUS

## 2017-02-17 MED ORDER — MAGNESIUM OXIDE 400 (241.3 MG) MG PO TABS
400.0000 mg | ORAL_TABLET | Freq: Every day | ORAL | Status: DC
  Administered 2017-02-18 – 2017-02-21 (×4): 400 mg via ORAL
  Filled 2017-02-17 (×4): qty 1

## 2017-02-17 MED ORDER — MAGNESIUM 400 MG PO TABS: 400.0000 mg | ORAL_TABLET | Freq: Every day | ORAL | Status: DC

## 2017-02-17 MED ORDER — DULOXETINE HCL 30 MG PO CPEP
60.0000 mg | ORAL_CAPSULE | Freq: Every day | ORAL | Status: DC
  Administered 2017-02-17 – 2017-02-20 (×4): 60 mg via ORAL
  Filled 2017-02-17 (×4): qty 2

## 2017-02-17 MED ORDER — ASPIRIN 300 MG RE SUPP: 300.0000 mg | RECTAL | Status: AC

## 2017-02-17 MED ORDER — APIXABAN 5 MG PO TABS
5.0000 mg | ORAL_TABLET | Freq: Two times a day (BID) | ORAL | Status: DC
  Administered 2017-02-17 – 2017-02-21 (×8): 5 mg via ORAL
  Filled 2017-02-17 (×8): qty 1

## 2017-02-17 MED ORDER — PANTOPRAZOLE SODIUM 40 MG PO TBEC
40.0000 mg | DELAYED_RELEASE_TABLET | Freq: Every day | ORAL | Status: DC
  Administered 2017-02-18 – 2017-02-21 (×4): 40 mg via ORAL
  Filled 2017-02-17 (×4): qty 1

## 2017-02-17 NOTE — ED Triage Notes (Signed)
Pt with epigastric pain for 6 days with sob.  Left foot pain ongoing for 6 days.  Swelling noted left foot and ankle.

## 2017-02-17 NOTE — H&P (Signed)
History and Physical    [name] QMG:[id number] DOB: Dec 14, 1932 DOA: 02/17/2017  PCP: Tawni Carnes, PA-C   Patient coming from: Home  Chief Complaint: Epigastric/chest pain, SOB, ankle pain and swelling  HPI: [name] is a 83 y.o. female with medical history significant for chronic systolic CHF, atrial fibrillation on Eliquis, depression with anxiety, and COPD, now presenting to the emergency department for evaluation of discomfort in the lower chest that is occurred intermittently over the past several days.  Patient reports a dull, nonradiating, moderate in severity discomfort at the lower chest centrally.  This has been been occurring more frequently with exertion and has been associated with some mild dyspnea but no cough, fevers, or chills.  She also reports some swelling and severe pain in her foot and ankle.  Denies any injury or inciting event that she can recall for her ankle pain.  ED Course: Upon arrival to the ED, patient is found to be afebrile, saturating low 90s on room air, heart rate in the low 100s, and stable.  EKG features atrial fibrillation with rate 101.  Chest x-ray features a persistent focal consolidation in the right upper lobe and was followed up with CT of the chest demonstrates interval improvement of the parenchymal lung disease in the right upper lobe, likely improving infectious or inflammatory process.  Radiographs of the right foot are negative.  Chemistry panel reveals a serum creatinine 1.29, consistent with her baseline.  CBC is notable for a leukocytosis to 12,800 and a mild thrombocytosis with platelets 141,000.  Troponin is undetectable, BNP is elevated to 589, and uric acid is elevated to a value of 9.6.  Patient was treated with Dilaudid and Zofran in the ED.  She remained hemodynamically stable respiratory distress.  She will be observed on the telemetry unit for ongoing evaluation and management of intermittent chest discomfort in patient with  known CAD.  Review of Systems:  All other systems reviewed and apart from HPI, are negative.  Past Medical History:  Diagnosis Date  . Anxiety   . Arthritis   . Atrial fibrillation ([place])   . Bursitis    left shoulder  . Cataract   . CHF (congestive heart failure) (HCC)    EF 55-60%  . Complication of anesthesia   . COPD (chronic obstructive pulmonary disease) ([place])   . Coronary atherosclerosis of native coronary artery    a. DES to CX, moderately severe stenosis RCA, mild stenosis LAD 04/2013  . Depression   . Dysphagia, unspecified(787.20)   . GERD (gastroesophageal reflux disease)    Hx Schatzki's ring, multiple EGD/ED last 01/06/2004  . Headache   . Heart disease   . Heart murmur    'a littel'  . History of anemia   . HTN (hypertension)    Hx of it  . Hyperlipemia   . Hyperlipidemia   . Internal hemorrhoids without mention of complication   . MI (myocardial infarction) ([place]) 2006  . Microscopic colitis 2003  . Other and unspecified hyperlipidemia   . Panic disorder without agoraphobia   . Paresthesia    hands, feet  . Pneumonia 12/2011  . PONV (postoperative nausea and vomiting)    'a little nausea"  . PVD (peripheral vascular disease) ([place])   . S/P colonoscopy 09/27/2001   internal hemorrhoids, desc colon inflam polyp, SB BX-chronic duodenitis, colitis  . Shortness of breath   . Sleep disorder    obstructive  . Thyroid disease    recent abnl  TSH per pt    Past Surgical History:  Procedure Laterality Date  . ABDOMINAL HYSTERECTOMY    . ANTERIOR AND POSTERIOR REPAIR     with resection of vagina  . ANTERIOR LAT LUMBAR FUSION N/A 08/01/2016   Procedure: Lumbar Two-Lumbar Five Transpsoas lateral interbody fusion with Lumbar Two-Three lateral plate fixation;  Surgeon: Kevan Ny Ditty, MD;  Location: [place];  Service: Neurosurgery;  Laterality: N/A;  L2-5 Transpsoas lateral interbody fusion with L2-3 lateral plate fixation  . APPENDECTOMY    . BACK SURGERY      . BIOPSY  07/05/2015   Procedure: BIOPSY;  Surgeon: Daneil Dolin, MD;  [facility];  Service: Endoscopy;;  gastric polyp biopsy, ascending colon biopsy  . BLADDER SUSPENSION  11/09/2011   Procedure: TRANSVAGINAL TAPE (TVT) PROCEDURE;  Surgeon: Marissa Nestle, MD;  Location: [facility];  Service: Urology;  Laterality: N/A;  . bladder tack  06/2010  . BREAST LUMPECTOMY  1998   left, benign  . CARDIAC CATHETERIZATION    . CARDIAC CATHETERIZATION    . CARDIAC CATHETERIZATION N/A 12/16/2015   Procedure: Left Heart Cath and Coronary Angiography;  Surgeon: Troy Sine, MD;  Location: [place] CV LAB;  Service: Cardiovascular;  Laterality: N/A;  . [place]   left  . CHOLECYSTECTOMY  1998  . COLONOSCOPY  03/16/2011   multiple hyperplastic colon polyps, sigmoid diverticulosis, melanosis coli  . COLONOSCOPY WITH PROPOFOL N/A 07/05/2015   RMR:one 5 mm polyp in descending colon  . CORONARY ANGIOPLASTY WITH STENT PLACEMENT    . ESOPHAGEAL DILATION N/A 07/05/2015   Procedure: ESOPHAGEAL DILATION;  Surgeon: Daneil Dolin, MD;  [facility];  Service: Endoscopy;  Laterality: N/A;  . ESOPHAGOGASTRODUODENOSCOPY (EGD) WITH PROPOFOL N/A 07/05/2015   MAU:[id number]  . JOINT REPLACEMENT Right 2007   right knee  . left hand surgery    . LEFT HEART CATHETERIZATION WITH CORONARY ANGIOGRAM N/A 05/14/2013   Procedure: LEFT HEART CATHETERIZATION WITH CORONARY ANGIOGRAM;  Surgeon: Blane Ohara, MD;  Location: [facility] CATH LAB;  Service: Cardiovascular;  Laterality: N/A;  . left rotator cuff surgery    . LUMBAR LAMINECTOMY/DECOMPRESSION MICRODISCECTOMY N/A 10/11/2012   Procedure: LUMBAR LAMINECTOMY/DECOMPRESSION MICRODISCECTOMY 2 LEVELS;  Surgeon: Floyce Stakes, MD;  Location: [place] [facility]UR[facility] ORS;  Service: Neurosurgery;  Laterality: N/A;  L3-4 L4-5 Laminectomy  . LUMBAR WOUND DEBRIDEMENT N/A 09/27/2015   Procedure: Exploration of Lumbar Wound w/ Repair CSF Leak/Lumbar Drain  Placement;  Surgeon: Leeroy Cha, MD;  Location: [place] NEURO ORS;  Service: Neurosurgery;  Laterality: N/A;  . MALONEY DILATION  03/16/2011   Gastritis. No H.pylori on bx. 64F maloney dilation with disruption of  occult cevical esophageal web  . NASAL SINUS SURGERY    . right knee replacement  2007  . right leg benign tumor    . SHOULDER SURGERY Left   . TONSILLECTOMY    . unspecified area, hysterectomy  1972   partial     reports that she quit smoking about 15 years ago. Her smoking use included Cigarettes. She started smoking about 69 years ago. She has a 64.00 pack-year smoking history. She has never used smokeless tobacco. She reports that she does not drink alcohol or use drugs.  Allergies  Allergen Reactions  . Cephalosporins Diarrhea and Nausea Only    Lightheaded  . Levaquin [Levofloxacin In D5w] Swelling  . Macrodantin [Nitrofurantoin Macrocrystal] Swelling  . Phenothiazines Anaphylaxis and Hives  . Polysorbate Anaphylaxis  .  Prednisone Shortness Of Breath  . Buspirone Itching  . Cardura [Doxazosin Mesylate] Itching  . Codeine Itching  . Acyclovir And Related Itching    Redness of skin  . Prochlorperazine Other (See Comments)    "Upset stomach"  . Ranexa [Ranolazine]     Severe drop in BP  . Atorvastatin Hives    Cramping; tolerates Crestor ok  . Ofloxacin Rash  . Other Itching and Rash    "WOOL"= make skin look like it has been burned  . Penicillins Other (See Comments)    Causes redness all over. Has patient had a PCN reaction causing immediate rash, facial/tongue/throat swelling, SOB or lightheadedness with hypotension: No Has patient had a PCN reaction causing severe rash involving mucus membranes or skin necrosis: No Has patient had a PCN reaction that required hospitalization No Has patient had a PCN reaction occurring within the last 10 years: No If all of the above answers are "NO", then may proceed with Cephalosporin use.   . Pimozide Hives and Itching      Family History  Problem Relation Age of Onset  . Stroke Mother   . Parkinson's disease Father   . Coronary artery disease Other        family Hx-sons  . Cancer Other   . Stroke Other        family Hx  . Hypertension Other        family Hx  . Diabetes Brother   . Heart disease Son        before age 76  . Diabetes Son   . Stroke Daughter 72  . Colon cancer Neg Hx      Prior to Admission medications   Medication Sig Start Date End Date Taking? Authorizing Provider  acetaminophen (TYLENOL) 500 MG tablet Take 500 mg by mouth daily as needed for headache.     [provider]  ALPRAZolam Duanne Moron) 0.25 MG tablet Take 0.25 mg by mouth 2 (two) times daily as needed. 09/04/16   [provider]  bisacodyl (DULCOLAX) 5 MG EC tablet Take 1 tablet (5 mg total) by mouth daily as needed for moderate constipation. 04/18/16   Thurnell Lose, MD  DULoxetine (CYMBALTA) 60 MG capsule Take 60 mg by mouth at bedtime.     [provider]  ELIQUIS 5 MG TABS tablet TAKE ONE TABLET BY MOUTH TWICE DAILY. 11/17/16   Herminio Commons, MD  fluticasone (FLONASE) 50 MCG/ACT nasal spray Place 2 sprays into both nostrils daily.     [provider]  furosemide (LASIX) 40 MG tablet Take 40 mg by mouth daily.    [provider]  gabapentin (NEURONTIN) 300 MG capsule Take 1 capsule (300 mg total) by mouth 3 (three) times daily. 10/08/15   Ditty, Kevan Ny, MD  hydrocortisone (ANUSOL-HC) 25 MG suppository Place 1 suppository (25 mg total) rectally every 12 (twelve) hours. 04/26/16   Carlis Stable, NP  ipratropium-albuterol (DUONEB) 0.5-2.5 (3) MG/3ML SOLN Take 3 mLs by nebulization every 6 (six) hours. 12/18/15   Regalado, Belkys A, MD  Magnesium 400 MG TABS Take 400 mg by mouth daily. 04/28/16   Herminio Commons, MD  methocarbamol (ROBAXIN) 500 MG tablet Take 500 mg by mouth 2 (two) times daily as needed for muscle spasms.    [provider]  metoprolol  succinate (TOPROL-XL) 100 MG 24 hr tablet Take 1 and 1/2 Tablet Daily with or immediately following a meal. 11/29/16   Imogene Burn, PA-C  Multiple Vitamin (MULTIVITAMIN WITH MINERALS) TABS tablet Take 1 tablet by mouth daily with lunch. Centrum    [provider]  nitroGLYCERIN (NITROSTAT) 0.4 MG SL tablet Place 0.4 mg under the tongue every 5 (five) minutes as needed for chest pain. Reported on 08/04/2015    [provider]  oxyCODONE (ROXICODONE) 5 MG immediate release tablet Take one tablet by mouth twice daily as needed for pain 08/11/16   Reed, Tiffany L, DO  pantoprazole (PROTONIX) 40 MG tablet Take 40 mg by mouth daily.     [provider]  potassium chloride (K-DUR,KLOR-CON) 10 MEQ tablet TAKE TWO (2) TABLETS BY MOUTH DAILY. 11/17/16   Herminio Commons, MD  potassium chloride SA (K-DUR,KLOR-CON) 20 MEQ tablet Take 20 mEq by mouth daily.     [provider]    Physical Exam: Vitals:   02/17/17 1545 02/17/17 1800  BP:  123/83  Resp:  17  Weight: 68 kg (150 lb)   Height: 5\' 1"  (1.549 m)       Constitutional: NAD, calm Eyes: PERTLA, lids and conjunctivae normal ENMT: Mucous membranes are moist. Posterior pharynx clear of any exudate or lesions.   Neck: normal, supple, no masses, no thyromegaly Respiratory: clear to auscultation bilaterally, no wheezing, no crackles. Normal respiratory effort.    Cardiovascular: Rate ~80 and irregular. No extremity edema. No significant JVD. Abdomen: No distension, no tenderness, no masses palpated. Bowel sounds normal.  Musculoskeletal: no clubbing / cyanosis. Left ankle edema, heat, and tenderness.    Skin: Faint erythema about the left ankle. Warm, dry, well-perfused. Neurologic: CN 2-12 grossly intact. Sensation intact. Strength 5/5 in all 4 limbs.  Psychiatric: Alert and oriented x 3. Pleasant, cooperative.     Labs on Admission: I have personally reviewed following labs and imaging  studies  CBC:  Recent Labs Lab 02/17/17 1549  WBC 12.8*  NEUTROABS 9.2*  HGB 12.2  HCT 38.0  MCV 82.3  PLT 433*   Basic Metabolic Panel:  Recent Labs Lab 02/17/17 1549  NA 135  K 3.5  CL 96*  CO2 27  GLUCOSE 137*  BUN 23*  CREATININE 1.29*  CALCIUM 9.6   GFR: Estimated Creatinine Clearance: 29.7 mL/min (A) (by C-G formula based on SCr of 1.29 mg/dL (H)). Liver Function Tests:  Recent Labs Lab 02/17/17 1552  AST 44*  ALT 30  ALKPHOS 285*  BILITOT 0.9  PROT 8.7*  ALBUMIN 3.8   No results for input(s): LIPASE, AMYLASE in the last 168 hours. No results for input(s): AMMONIA in the last 168 hours. Coagulation Profile: No results for input(s): INR, PROTIME in the last 168 hours. Cardiac Enzymes:  Recent Labs Lab 02/17/17 1549  TROPONINI <0.03   BNP (last 3 results) No results for input(s): PROBNP in the last 8760 hours. HbA1C: No results for input(s): HGBA1C in the last 72 hours. CBG: No results for input(s): GLUCAP in the last 168 hours. Lipid Profile: No results for input(s): CHOL, HDL, LDLCALC, TRIG, CHOLHDL, LDLDIRECT in the last 72 hours. Thyroid Function Tests: No results for input(s): TSH, T4TOTAL, FREET4, T3FREE, THYROIDAB in the last 72 hours. Anemia Panel: No results for input(s): VITAMINB12, FOLATE, FERRITIN, TIBC, IRON, RETICCTPCT in the last 72 hours. Urine analysis:    Component Value Date/Time   COLORURINE STRAW (A) 09/30/2016 1204   APPEARANCEUR CLEAR 09/30/2016 1204   LABSPEC 1.004 (L) 09/30/2016 1204   PHURINE 7.0 09/30/2016 [place] 09/30/2016 1204   HGBUR NEGATIVE 09/30/2016 1204  BILIRUBINUR NEGATIVE 09/30/2016 [place] 09/30/2016 1204   PROTEINUR NEGATIVE 09/30/2016 1204   UROBILINOGEN 0.2 02/21/2015 2028   NITRITE NEGATIVE 09/30/2016 1204   LEUKOCYTESUR SMALL (A) 09/30/2016 1204   Sepsis Labs: @LABRCNTIP (procalcitonin:4,lacticidven:4) )No results found for this or any previous visit  (from the past 240 hour(s)).   Radiological Exams on Admission: Ct Chest W Contrast  Result Date: 02/17/2017 CLINICAL DATA:  Initial evaluation for acute chest pain, shortness of breath. EXAM: CT CHEST WITH CONTRAST TECHNIQUE: Multidetector CT imaging of the chest was performed during intravenous contrast administration. CONTRAST:  16mL ISOVUE-300 IOPAMIDOL (ISOVUE-300) INJECTION 61% COMPARISON:  Prior radiograph from earlier the same day as well as prior CT from 09/30/2016. FINDINGS: Cardiovascular: Intrathoracic aorta of normal caliber without aneurysm or acute abnormality. Moderate atherosclerosis. Visualized great vessels within normal limits. Cardiomegaly. Diffuse 3 vessel coronary artery calcifications. No pericardial effusion. Limited evaluation of the pulmonary arterial tree grossly unremarkable. Mediastinum/Nodes: Visualized thyroid normal. 12 mm AP window node is decreased in size from previous, previously 16 mm). Mildly prominent subcarinal node also decreased measuring 13 mm, previously 17 mm). Mild soft tissue prominence within the right hilar region similar to slightly decreased in prominence. 7 mm left hilar node not significantly changed. No new adenopathy. No enlarged axillary nodes. Esophagus normal. Lungs/Pleura: Tracheobronchial tree intact and patent. Mild bibasilar interlobular septal thickening is similar to previous exam. Previously seen peripheral parenchymal opacity within the posterior oral lateral right lung persists but is improved relative to prior CT (series 4, image 46), now all less dense in appearance. Additional patchy reticulonodular densities within the posterior right lower lobe are relatively similar. Additional reticulonodular opacity at the posterior left lower lobe slightly improved (series 4, image 75). No new focal infiltrates. 5 mm left upper lobe pulmonary nodule is relatively similar to previous, likely infectious or inflammatory (series 4, image 61). No pulmonary  edema. No pleural effusion. No pneumothorax. Upper Abdomen: Visualized upper abdomen demonstrates no acute abnormality. Patient status post cholecystectomy. 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 Musculoskeletal: No acute osseus abnormality. Remote left-sided rib fractures noted. No worrisome lytic or blastic osseous lesions. IMPRESSION: 1. Interval improvement and peripheral parenchymal lung disease involving the right upper lobe as compared to prior CT from 09/30/2016. Finding favored to reflect improving infectious or inflammatory process, with a neoplastic process felt to be unlikely given the interval improvement. Additional scattered lung opacities are also stable to slightly improved. Follow-up to resolution recommended. 2. Persistent but improved mediastinal and hilar adenopathy. Continued attention at follow-up recommended. 3. No other new cardiopulmonary abnormality. Electronically Signed   By: Jeannine Boga M.D.   On: 02/17/2017 18:59   Dg Chest Portable 1 View  Result Date: 02/17/2017 CLINICAL DATA:  Patient with chest pain and shortness of breath. EXAM: PORTABLE CHEST 1 VIEW COMPARISON:  Chest radiograph 09/30/2016. FINDINGS: Monitoring leads overlie the patient. Stable cardiomegaly. Aortic atherosclerosis. Pulmonary vascular redistribution and bilateral interstitial pulmonary opacities, unchanged from prior. Additionally there is persistent focal consolidation within the right upper lobe. No pleural effusion or pneumothorax. IMPRESSION: Persistent focal consolidative opacity within the right upper lobe. This needs further evaluated with chest CT. Cardiomegaly. Chronic interstitial opacities bilaterally. Electronically Signed   By: Lovey Newcomer M.D.   On: 02/17/2017 16:44   Dg Foot Complete Right  Result Date: 02/17/2017 CLINICAL DATA:  Bilateral foot pain and swelling for the past week. No known injury. EXAM: RIGHT FOOT COMPLETE - 3+ VIEW COMPARISON:  05/17/2016.  FINDINGS: Stable second metatarsal bone island. Stable calcaneal spurs. No visible soft tissue swelling. IMPRESSION: No acute abnormality. Electronically Signed   By: Claudie Revering M.D.   On: 02/17/2017 16:44    EKG: Independently reviewed. Atrial fibrillation.  Assessment/Plan  1. Chest pain, CAD  - Pt presents with several days of intermittent discomfort in lower chest centrally, more commonly occurring with exertion - She has known CAD treated with stent to LCx in 2015, cath in August 2017 with widely patent stent 20% stenosis in proximal LAD, and 80% stenosis in non-dominant, small, RCA - She follows with cardiology and is managed medically  - Initial troponin undetectable, EKG without acute ischemic features  - Plan to continue cardiac monitoring, obtain serial troponin measurements, repeat EKG, continue Eliquis and metoprolol; she is statin-intolerant    2. Gout flare  - Pt reports severe pain and mild edema in her ankle, radiographs negative, uric acid level 9.6  - She has serious allergy listed to prednisone and will be started on colchicine   3. Atrial fibrillation  - Pt is in atrial fibrillation on admission  - CHADS-VASc is at least 54 (age x2, gender, CAD, CHF)  - Continue Eliquis and Toprol    4. Chronic systolic CHF  - Appears well-compensated on admission   - Continue Lasix, beta-blocker, follow daily wts and I/O's, SLIV    5. Chronic back pain - Stable  - Continue home regimen with gabapentin, Robaxin, and Oxy IR   6. Depression, anxiety  - Stable  - Continue Cymbalta and prn Xanax    7. CKD stage III  - SCr is 1.29 on admission, consistent with her baseline  - Renally-dose medications as needed, avoid nephrotoxins as possible    DVT prophylaxis: Eliquis Code Status: Full  Family Communication: Discussed with patient Disposition Plan: Observe on telemetry Consults called: None Admission status: Observation    Vianne Bulls, MD [facility] Pager  [phone]  If 7PM-7AM, please contact night-coverage [email or web address] Password [email or web address]  02/17/2017, 7:40 PM

## 2017-02-17 NOTE — ED Provider Notes (Signed)
[facility] EMERGENCY DEPARTMENT Provider Note   CSN: [id number] Arrival date & time: 02/17/17  1539     History   Chief Complaint Chief Complaint  Patient presents with  . Chest Pain    HPI [name] is a 83 y.o. female.  Patient complains of chest pain off and on for the last 2 weeks.  It is worse with exertion.  Patient also complains of pain in both feet   The history is provided by the patient.  Chest Pain   This is a new problem. The current episode started more than 1 week ago. The problem has not changed since onset.The pain is associated with exertion. The pain is present in the substernal region. The pain is at a severity of 6/10. The pain is severe. The quality of the pain is described as dull. The pain does not radiate. Pertinent negatives include no abdominal pain, no back pain, no cough and no headaches.  Pertinent negatives for past medical history include no seizures.    Past Medical History:  Diagnosis Date  . Anxiety   . Arthritis   . Atrial fibrillation ([place])   . Bursitis    left shoulder  . Cataract   . CHF (congestive heart failure) (HCC)    EF 55-60%  . Complication of anesthesia   . COPD (chronic obstructive pulmonary disease) ([place])   . Coronary atherosclerosis of native coronary artery    a. DES to CX, moderately severe stenosis RCA, mild stenosis LAD 04/2013  . Depression   . Dysphagia, unspecified(787.20)   . GERD (gastroesophageal reflux disease)    Hx Schatzki's ring, multiple EGD/ED last 01/06/2004  . Headache   . Heart disease   . Heart murmur    'a littel'  . History of anemia   . HTN (hypertension)    Hx of it  . Hyperlipemia   . Hyperlipidemia   . Internal hemorrhoids without mention of complication   . MI (myocardial infarction) ([place]) 2006  . Microscopic colitis 2003  . Other and unspecified hyperlipidemia   . Panic disorder without agoraphobia   . Paresthesia    hands, feet  . Pneumonia 12/2011  . PONV (postoperative  nausea and vomiting)    'a little nausea"  . PVD (peripheral vascular disease) ([place])   . S/P colonoscopy 09/27/2001   internal hemorrhoids, desc colon inflam polyp, SB BX-chronic duodenitis, colitis  . Shortness of breath   . Sleep disorder    obstructive  . Thyroid disease    recent abnl TSH per pt    Patient Active Problem List   Diagnosis Date Noted  . Acute gout 02/17/2017  . Chest pain 09/30/2016  . Status post lumbar spine surgery for decompression of spinal cord 08/07/2016  . Lumbosacral spondylosis with radiculopathy 08/01/2016  . Preoperative clearance 07/05/2016  . Cough 05/26/2016  . Weakness 04/26/2016  . Nausea without vomiting 04/26/2016  . Atrial fibrillation with RVR ([place]) 04/15/2016  . SOB (shortness of breath) 03/20/2016  . Hypoxia   . Acute on chronic diastolic congestive heart failure ([place])   . Chronic kidney disease, stage III (moderate) ([place]) 02/17/2016  . Essential hypertension 02/17/2016  . COPD exacerbation ([place]) 02/17/2016  . Acute bronchitis 12/18/2015  . CAD in native artery   . Chest pain at rest 10/15/2015  . Chronic low back pain 10/15/2015  . Hyponatremia 10/15/2015  . Hyperglycemia 10/15/2015  . Thrombocytosis ([place]) 10/15/2015  . Atypical chest pain 10/15/2015  . Depression   .  Anxiety   . Gastroesophageal reflux disease without esophagitis   . Mild cognitive impairment 10/14/2015  . Iron deficiency anemia due to chronic blood loss   . Meningitis 10/01/2015  . Coronary artery disease due to lipid rich plaque   . NSVT (nonsustained ventricular tachycardia) ([place])   . PAF (paroxysmal atrial fibrillation) ([place])   . CSF leak 09/27/2015  . Spondylolisthesis of lumbar region 09/24/2015  . Gastric polyp   . History of colonic polyps   . Chronic diarrhea   . Esophageal dysphagia 04/01/2015  . PAOD (peripheral arterial occlusive disease) ([place]) 03/05/2015  . Pain in the chest   . Hyperlipidemia   . Weight gain 08/12/2014  . Anemia  08/07/2014  . Hemorrhoids 08/06/2014  . CHF (congestive heart failure) ([place]) 07/29/2014  . Rectal bleeding 07/06/2014  . Constipation 07/06/2014  . Lower extremity edema 11/24/2013  . Acute kidney failure ([place]) 11/24/2013  . Hypokalemia 11/24/2013  . Other and unspecified angina pectoris 05/14/2013  . Hematochezia 02/13/2011  . Abdominal pain 02/13/2011  . Major depression ([place]) 09/28/2010  . FATIGUE 04/13/2009  . Chronic diastolic heart failure ([place]) 11/30/2008  . DYSPNEA 11/30/2008  . CAD, NATIVE VESSEL - PCI + DES to left circumflex 05/14/13 11/27/2008  . Peripheral vascular disease ([place]) 11/27/2008  . PANIC ATTACK 02/28/2008  . MI 02/28/2008  . Internal hemorrhoids 02/28/2008  . COLITIS 02/28/2008  . Dysphagia 02/28/2008    Past Surgical History:  Procedure Laterality Date  . ABDOMINAL HYSTERECTOMY    . ANTERIOR AND POSTERIOR REPAIR     with resection of vagina  . ANTERIOR LAT LUMBAR FUSION N/A 08/01/2016   Procedure: Lumbar Two-Lumbar Five Transpsoas lateral interbody fusion with Lumbar Two-Three lateral plate fixation;  Surgeon: Kevan Ny Ditty, MD;  Location: [place];  Service: Neurosurgery;  Laterality: N/A;  L2-5 Transpsoas lateral interbody fusion with L2-3 lateral plate fixation  . APPENDECTOMY    . BACK SURGERY    . BIOPSY  07/05/2015   Procedure: BIOPSY;  Surgeon: Daneil Dolin, MD;  Location[facility];  Service: Endoscopy;;  gastric polyp biopsy, ascending colon biopsy  . BLADDER SUSPENSION  11/09/2011   Procedure: TRANSVAGINAL TAPE (TVT) PROCEDURE;  Surgeon: Marissa Nestle, MD;  Location: [facility] ORS;  Service: Urology;  Laterality: N/A;  . bladder tack  06/2010  . BREAST LUMPECTOMY  1998   left, benign  . CARDIAC CATHETERIZATION    . CARDIAC CATHETERIZATION    . CARDIAC CATHETERIZATION N/A 12/16/2015   Procedure: Left Heart Cath and Coronary Angiography;  Surgeon: Troy Sine, MD;  Location: [place] CV LAB;  Service: Cardiovascular;  Laterality: N/A;   . [place]   left  . CHOLECYSTECTOMY  1998  . COLONOSCOPY  03/16/2011   multiple hyperplastic colon polyps, sigmoid diverticulosis, melanosis coli  . COLONOSCOPY WITH PROPOFOL N/A 07/05/2015   RMR:one 5 mm polyp in descending colon  . CORONARY ANGIOPLASTY WITH STENT PLACEMENT    . ESOPHAGEAL DILATION N/A 07/05/2015   Procedure: ESOPHAGEAL DILATION;  Surgeon: Daneil Dolin, MD;  [facility];  Service: Endoscopy;  Laterality: N/A;  . ESOPHAGOGASTRODUODENOSCOPY (EGD) WITH PROPOFOL N/A 07/05/2015   HEN:IDPOEU  . JOINT REPLACEMENT Right 2007   right knee  . left hand surgery    . LEFT HEART CATHETERIZATION WITH CORONARY ANGIOGRAM N/A 05/14/2013   Procedure: LEFT HEART CATHETERIZATION WITH CORONARY ANGIOGRAM;  Surgeon: Blane Ohara, MD;  Location: [facility] CATH LAB;  Service: Cardiovascular;  Laterality: N/A;  . left rotator cuff surgery    . LUMBAR LAMINECTOMY/DECOMPRESSION MICRODISCECTOMY N/A 10/11/2012   Procedure: LUMBAR LAMINECTOMY/DECOMPRESSION MICRODISCECTOMY 2 LEVELS;  Surgeon: Floyce Stakes, MD;  Location: [place] NEURO ORS;  Service: Neurosurgery;  Laterality: N/A;  L3-4 L4-5 Laminectomy  . LUMBAR WOUND DEBRIDEMENT N/A 09/27/2015   Procedure: Exploration of Lumbar Wound w/ Repair CSF Leak/Lumbar Drain Placement;  Surgeon: Leeroy Cha, MD;  Location: [place] NEURO ORS;  Service: Neurosurgery;  Laterality: N/A;  . MALONEY DILATION  03/16/2011   Gastritis. No H.pylori on bx. 76F maloney dilation with disruption of  occult cevical esophageal web  . NASAL SINUS SURGERY    . right knee replacement  2007  . right leg benign tumor    . SHOULDER SURGERY Left   . TONSILLECTOMY    . unspecified area, hysterectomy  1972   partial    OB History    Gravida Para Term Preterm AB Living   8 6     2 5    SAB TAB Ectopic Multiple Live Births                   Home Medications    Prior to Admission medications   Medication Sig Start Date End Date Taking? Authorizing  Provider  acetaminophen (TYLENOL) 500 MG tablet Take 500 mg by mouth daily as needed for headache.     [provider]  ALPRAZolam Duanne Moron) 0.25 MG tablet Take 0.25 mg by mouth 2 (two) times daily as needed. 09/04/16   [provider]  bisacodyl (DULCOLAX) 5 MG EC tablet Take 1 tablet (5 mg total) by mouth daily as needed for moderate constipation. 04/18/16   Thurnell Lose, MD  DULoxetine (CYMBALTA) 60 MG capsule Take 60 mg by mouth at bedtime.     [provider]  ELIQUIS 5 MG TABS tablet TAKE ONE TABLET BY MOUTH TWICE DAILY. 11/17/16   Herminio Commons, MD  fluticasone (FLONASE) 50 MCG/ACT nasal spray Place 2 sprays into both nostrils daily.     [provider]  furosemide (LASIX) 40 MG tablet Take 40 mg by mouth daily.    [provider]  gabapentin (NEURONTIN) 300 MG capsule Take 1 capsule (300 mg total) by mouth 3 (three) times daily. 10/08/15   Ditty, Kevan Ny, MD  hydrocortisone (ANUSOL-HC) 25 MG suppository Place 1 suppository (25 mg total) rectally every 12 (twelve) hours. 04/26/16   Carlis Stable, NP  ipratropium-albuterol (DUONEB) 0.5-2.5 (3) MG/3ML SOLN Take 3 mLs by nebulization every 6 (six) hours. 12/18/15   Regalado, Belkys A, MD  Magnesium 400 MG TABS Take 400 mg by mouth daily. 04/28/16   Herminio Commons, MD  methocarbamol (ROBAXIN) 500 MG tablet Take 500 mg by mouth 2 (two) times daily as needed for muscle spasms.    [provider]  metoprolol succinate (TOPROL-XL) 100 MG 24 hr tablet Take 1 and 1/2 Tablet Daily with or immediately following a meal. 11/29/16   Imogene Burn, PA-C  Multiple Vitamin (MULTIVITAMIN WITH MINERALS) TABS tablet Take 1 tablet by mouth daily with lunch. Centrum    [provider]  nitroGLYCERIN (NITROSTAT) 0.4 MG SL tablet Place 0.4 mg under the tongue every 5 (five) minutes as needed for chest pain. Reported on 08/04/2015    [provider]  oxyCODONE (ROXICODONE) 5 MG  immediate release tablet Take one tablet by mouth twice daily as needed for pain 08/11/16   Reed, Tiffany L, DO  pantoprazole (  PROTONIX) 40 MG tablet Take 40 mg by mouth daily.     [provider]  potassium chloride (K-DUR,KLOR-CON) 10 MEQ tablet TAKE TWO (2) TABLETS BY MOUTH DAILY. 11/17/16   Herminio Commons, MD  potassium chloride SA (K-DUR,KLOR-CON) 20 MEQ tablet Take 20 mEq by mouth daily.     [provider]    Family History Family History  Problem Relation Age of Onset  . Stroke Mother   . Parkinson's disease Father   . Coronary artery disease Other        family Hx-sons  . Cancer Other   . Stroke Other        family Hx  . Hypertension Other        family Hx  . Diabetes Brother   . Heart disease Son        before age 83  . Diabetes Son   . Stroke Daughter 82  . Colon cancer Neg Hx     Social History Social History  Substance Use Topics  . Smoking status: Former Smoker    Packs/day: 1.00    Years: 64.00    Types: Cigarettes    Start date: 12/24/1947    Quit date: 11/17/2001  . Smokeless tobacco: Never Used     Comment: Quit smoking in 2003  . Alcohol use No     Allergies   Cephalosporins; Levaquin [levofloxacin in d5w]; Macrodantin [nitrofurantoin macrocrystal]; Phenothiazines; Polysorbate; Prednisone; Buspirone; Cardura [doxazosin mesylate]; Codeine; Acyclovir and related; Prochlorperazine; Ranexa [ranolazine]; Atorvastatin; Ofloxacin; Other; Penicillins; and Pimozide   Review of Systems Review of Systems  Constitutional: Negative for appetite change and fatigue.  HENT: Negative for congestion, ear discharge and sinus pressure.   Eyes: Negative for discharge.  Respiratory: Negative for cough.   Cardiovascular: Positive for chest pain.  Gastrointestinal: Negative for abdominal pain and diarrhea.  Genitourinary: Negative for frequency and hematuria.  Musculoskeletal: Negative for back pain.       Patient has pain in both feet  Skin:  Negative for rash.  Neurological: Negative for seizures and headaches.  Psychiatric/Behavioral: Negative for hallucinations.     Physical Exam Updated Vital Signs BP 123/83   Resp 17   Ht 5\' 1"  (1.549 m)   Wt 68 kg (150 lb)   BMI 28.34 kg/m   Physical Exam  Constitutional: She is oriented to person, place, and time. She appears well-developed.  HENT:  Head: Normocephalic.  Eyes: Conjunctivae and EOM are normal. No scleral icterus.  Neck: Neck supple. No thyromegaly present.  Cardiovascular: Normal rate and regular rhythm.  Exam reveals no gallop and no friction rub.   No murmur heard. Pulmonary/Chest: No stridor. She has no wheezes. She has no rales. She exhibits no tenderness.  Abdominal: She exhibits no distension. There is no tenderness. There is no rebound.  Musculoskeletal: Normal range of motion. She exhibits no edema.  Tenderness and swelling to both ankles and right large toe  Lymphadenopathy:    She has no cervical adenopathy.  Neurological: She is oriented to person, place, and time. She exhibits normal muscle tone. Coordination normal.  Skin: No rash noted. No erythema.  Psychiatric: She has a normal mood and affect. Her behavior is normal.     ED Treatments / Results  Labs (all labs ordered are listed, but only abnormal results are displayed) Labs Reviewed  CBC WITH DIFFERENTIAL/PLATELET - Abnormal; Notable for the following:       Result Value   WBC 12.8 (*)  RDW 17.3 (*)    Platelets 441 (*)    Neutro Abs 9.2 (*)    Monocytes Absolute 1.2 (*)    All other components within normal limits  BASIC METABOLIC PANEL - Abnormal; Notable for the following:    Chloride 96 (*)    Glucose, Bld 137 (*)    BUN 23 (*)    Creatinine, Ser 1.29 (*)    GFR calc non Af Amer 37 (*)    GFR calc Af Amer 43 (*)    All other components within normal limits  URIC ACID - Abnormal; Notable for the following:    Uric Acid, Serum 9.6 (*)    All other components within  normal limits  HEPATIC FUNCTION PANEL - Abnormal; Notable for the following:    Total Protein 8.7 (*)    AST 44 (*)    Alkaline Phosphatase 285 (*)    All other components within normal limits  BRAIN NATRIURETIC PEPTIDE - Abnormal; Notable for the following:    B Natriuretic Peptide 589.0 (*)    All other components within normal limits  TROPONIN I  TROPONIN I  TROPONIN I  BASIC METABOLIC PANEL  CBC    EKG  EKG Interpretation  Date/Time:  Saturday February 17 2017 15:48:35 EDT Ventricular Rate:  101 PR Interval:    QRS Duration: 94 QT Interval:  354 QTC Calculation: 459 R Axis:   -7 Text Interpretation:  Atrial fibrillation Borderline repolarization abnormality Baseline wander in lead(s) V6 Confirmed by Milton Ferguson [phone]) on 02/17/2017 5:31:16 PM       Radiology Ct Chest W Contrast  Result Date: 02/17/2017 CLINICAL DATA:  Initial evaluation for acute chest pain, shortness of breath. EXAM: CT CHEST WITH CONTRAST TECHNIQUE: Multidetector CT imaging of the chest was performed during intravenous contrast administration. CONTRAST:  27mL ISOVUE-300 IOPAMIDOL (ISOVUE-300) INJECTION 61% COMPARISON:  Prior radiograph from earlier the same day as well as prior CT from 09/30/2016. FINDINGS: Cardiovascular: Intrathoracic aorta of normal caliber without aneurysm or acute abnormality. Moderate atherosclerosis. Visualized great vessels within normal limits. Cardiomegaly. Diffuse 3 vessel coronary artery calcifications. No pericardial effusion. Limited evaluation of the pulmonary arterial tree grossly unremarkable. Mediastinum/Nodes: Visualized thyroid normal. 12 mm AP window node is decreased in size from previous, previously 16 mm). Mildly prominent subcarinal node also decreased measuring 13 mm, previously 17 mm). Mild soft tissue prominence within the right hilar region similar to slightly decreased in prominence. 7 mm left hilar node not significantly changed. No new adenopathy. No enlarged  axillary nodes. Esophagus normal. Lungs/Pleura: Tracheobronchial tree intact and patent. Mild bibasilar interlobular septal thickening is similar to previous exam. Previously seen peripheral parenchymal opacity within the posterior oral lateral right lung persists but is improved relative to prior CT (series 4, image 46), now all less dense in appearance. Additional patchy reticulonodular densities within the posterior right lower lobe are relatively similar. Additional reticulonodular opacity at the posterior left lower lobe slightly improved (series 4, image 75). No new focal infiltrates. 5 mm left upper lobe pulmonary nodule is relatively similar to previous, likely infectious or inflammatory (series 4, image 61). No pulmonary edema. No pleural effusion. No pneumothorax. Upper Abdomen: Visualized upper abdomen demonstrates no acute abnormality. Patient status post cholecystectomy. 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 Musculoskeletal: No acute osseus abnormality. Remote left-sided rib fractures noted. No worrisome lytic or blastic osseous lesions. IMPRESSION: 1. Interval improvement and peripheral parenchymal lung disease involving the right upper lobe as compared to prior  CT from 09/30/2016. Finding favored to reflect improving infectious or inflammatory process, with a neoplastic process felt to be unlikely given the interval improvement. Additional scattered lung opacities are also stable to slightly improved. Follow-up to resolution recommended. 2. Persistent but improved mediastinal and hilar adenopathy. Continued attention at follow-up recommended. 3. No other new cardiopulmonary abnormality. Electronically Signed   By: Jeannine Boga M.D.   On: 02/17/2017 18:59   Dg Chest Portable 1 View  Result Date: 02/17/2017 CLINICAL DATA:  Patient with chest pain and shortness of breath. EXAM: PORTABLE CHEST 1 VIEW COMPARISON:  Chest radiograph 09/30/2016. FINDINGS: Monitoring leads  overlie the patient. Stable cardiomegaly. Aortic atherosclerosis. Pulmonary vascular redistribution and bilateral interstitial pulmonary opacities, unchanged from prior. Additionally there is persistent focal consolidation within the right upper lobe. No pleural effusion or pneumothorax. IMPRESSION: Persistent focal consolidative opacity within the right upper lobe. This needs further evaluated with chest CT. Cardiomegaly. Chronic interstitial opacities bilaterally. Electronically Signed   By: Lovey Newcomer M.D.   On: 02/17/2017 16:44   Dg Foot Complete Right  Result Date: 02/17/2017 CLINICAL DATA:  Bilateral foot pain and swelling for the past week. No known injury. EXAM: RIGHT FOOT COMPLETE - 3+ VIEW COMPARISON:  05/17/2016. FINDINGS: Stable second metatarsal bone island. Stable calcaneal spurs. No visible soft tissue swelling. IMPRESSION: No acute abnormality. Electronically Signed   By: Claudie Revering M.D.   On: 02/17/2017 16:44    Procedures Procedures (including critical care time)  Medications Ordered in ED Medications  metoprolol succinate (TOPROL-XL) 24 hr tablet 150 mg (not administered)  apixaban (ELIQUIS) tablet 5 mg (not administered)  potassium chloride SA (K-DUR,KLOR-CON) CR tablet 20 mEq (not administered)  ALPRAZolam (XANAX) tablet 0.25 mg (not administered)  furosemide (LASIX) tablet 40 mg (not administered)  oxyCODONE (Oxy IR/ROXICODONE) immediate release tablet 5-10 mg (not administered)  methocarbamol (ROBAXIN) tablet 500 mg (not administered)  multivitamin with minerals tablet 1 tablet (not administered)  Magnesium TABS 400 mg (not administered)  bisacodyl (DULCOLAX) EC tablet 5 mg (not administered)  ipratropium-albuterol (DUONEB) 0.5-2.5 (3) MG/3ML nebulizer solution 3 mL (not administered)  gabapentin (NEURONTIN) capsule 300 mg (not administered)  fluticasone (FLONASE) 50 MCG/ACT nasal spray 2 spray (not administered)  DULoxetine (CYMBALTA) DR capsule 60 mg (not  administered)  pantoprazole (PROTONIX) EC tablet 40 mg (not administered)  aspirin chewable tablet 324 mg (not administered)    Or  aspirin suppository 300 mg (not administered)  nitroGLYCERIN (NITROSTAT) SL tablet 0.4 mg (not administered)  acetaminophen (TYLENOL) tablet 650 mg (not administered)  ondansetron (ZOFRAN) injection 4 mg (not administered)  HYDROmorphone (DILAUDID) injection 0.5 mg (0.5 mg Intravenous Given 02/17/17 1614)  ondansetron (ZOFRAN) injection 4 mg (4 mg Intravenous Given 02/17/17 1613)  iopamidol (ISOVUE-300) 61 % injection 60 mL (60 mLs Intravenous Contrast Given 02/17/17 1816)     Initial Impression / Assessment and Plan / ED Course  I have reviewed the triage vital signs and the nursing notes.  Pertinent labs & imaging results that were available during my care of the patient were reviewed by me and considered in my medical decision making (see chart for details).     Patient will be admitted for chest discomfort which could possibly be anginal pain.  She also appears to have gout flareup in her feet  Final Clinical Impressions(s) / ED Diagnoses   Final diagnoses:  Unstable angina pectoris ([place])    New Prescriptions New Prescriptions   No medications on file     Milton Ferguson,  MD 02/17/17 1940

## 2017-02-18 ENCOUNTER — Other Ambulatory Visit: Payer: Self-pay

## 2017-02-18 DIAGNOSIS — M5440 Lumbago with sciatica, unspecified side: Secondary | ICD-10-CM | POA: Diagnosis not present

## 2017-02-18 DIAGNOSIS — N183 Chronic kidney disease, stage 3 (moderate): Secondary | ICD-10-CM | POA: Diagnosis not present

## 2017-02-18 DIAGNOSIS — I480 Paroxysmal atrial fibrillation: Secondary | ICD-10-CM | POA: Diagnosis not present

## 2017-02-18 DIAGNOSIS — G8929 Other chronic pain: Secondary | ICD-10-CM | POA: Diagnosis not present

## 2017-02-18 DIAGNOSIS — I5022 Chronic systolic (congestive) heart failure: Secondary | ICD-10-CM | POA: Diagnosis not present

## 2017-02-18 DIAGNOSIS — M109 Gout, unspecified: Secondary | ICD-10-CM | POA: Diagnosis not present

## 2017-02-18 DIAGNOSIS — I259 Chronic ischemic heart disease, unspecified: Secondary | ICD-10-CM | POA: Diagnosis not present

## 2017-02-18 DIAGNOSIS — I2510 Atherosclerotic heart disease of native coronary artery without angina pectoris: Secondary | ICD-10-CM | POA: Diagnosis not present

## 2017-02-18 LAB — CBC
HEMATOCRIT: 34.4 % — AB (ref 36.0–46.0)
Hemoglobin: 11 g/dL — ABNORMAL LOW (ref 12.0–15.0)
MCH: 26.4 pg (ref 26.0–34.0)
MCHC: 32 g/dL (ref 30.0–36.0)
MCV: 82.7 fL (ref 78.0–100.0)
Platelets: 372 10*3/uL (ref 150–400)
RBC: 4.16 MIL/uL (ref 3.87–5.11)
RDW: 17 % — AB (ref 11.5–15.5)
WBC: 8.3 10*3/uL (ref 4.0–10.5)

## 2017-02-18 LAB — BASIC METABOLIC PANEL
Anion gap: 7 (ref 5–15)
BUN: 23 mg/dL — AB (ref 6–20)
CHLORIDE: 99 mmol/L — AB (ref 101–111)
CO2: 30 mmol/L (ref 22–32)
Calcium: 9.1 mg/dL (ref 8.9–10.3)
Creatinine, Ser: 1.25 mg/dL — ABNORMAL HIGH (ref 0.44–1.00)
GFR calc Af Amer: 45 mL/min — ABNORMAL LOW (ref >60)
GFR calc non Af Amer: 39 mL/min — ABNORMAL LOW (ref >60)
Glucose, Bld: 97 mg/dL (ref 65–99)
POTASSIUM: 3.7 mmol/L (ref 3.5–5.1)
SODIUM: 136 mmol/L (ref 135–145)

## 2017-02-18 LAB — TROPONIN I: Troponin I: 0.03 ng/mL (ref <0.03)

## 2017-02-18 MED ORDER — POTASSIUM CHLORIDE CRYS ER 20 MEQ PO TBCR
40.0000 meq | EXTENDED_RELEASE_TABLET | Freq: Once | ORAL | Status: AC
  Administered 2017-02-18: 40 meq via ORAL
  Filled 2017-02-18: qty 2

## 2017-02-18 MED ORDER — METOPROLOL SUCCINATE ER 50 MG PO TB24
150.0000 mg | ORAL_TABLET | Freq: Every day | ORAL | Status: DC
  Administered 2017-02-18: 150 mg via ORAL
  Filled 2017-02-18: qty 3

## 2017-02-18 MED ORDER — ISOSORBIDE MONONITRATE ER 60 MG PO TB24
30.0000 mg | ORAL_TABLET | Freq: Every day | ORAL | Status: DC
  Administered 2017-02-18 – 2017-02-21 (×4): 30 mg via ORAL
  Filled 2017-02-18 (×4): qty 1

## 2017-02-18 MED ORDER — IPRATROPIUM-ALBUTEROL 0.5-2.5 (3) MG/3ML IN SOLN
3.0000 mL | Freq: Four times a day (QID) | RESPIRATORY_TRACT | Status: DC
  Administered 2017-02-18 – 2017-02-21 (×9): 3 mL via RESPIRATORY_TRACT
  Filled 2017-02-18 (×8): qty 3

## 2017-02-18 NOTE — Progress Notes (Signed)
**Note De-Identified  Obfuscation** EKG complete; abnormal results reported to MD

## 2017-02-18 NOTE — Progress Notes (Signed)
PROGRESS NOTE    [name]  OHY:[id number] DOB: 11/04/1932 DOA: 02/17/2017 PCP: Tawni Carnes, PA-C    Brief Narrative:  83 year old female with history of coronary artery disease, chronic systolic CHF atrial fibrillation, presents to the hospital with chest pain concerning for angina.  She also noted to have acute gouty arthritis in her ankles bilaterally.  She is ruled out for ACS with negative cardiac markers and nonacute EKG.  She will need cardiology clearance prior to discharge.  Acute gout attack has improved with colchicine.   Assessment & Plan:   Principal Problem:   Chest pain Active Problems:   CAD, NATIVE VESSEL - PCI + DES to left circumflex 05/14/13   Chronic diastolic heart failure (HCC)   Major depression (HCC)   PAF (paroxysmal atrial fibrillation) (HCC)   Chronic low back pain   Chronic kidney disease, stage III (moderate) (HCC)   Acute gout   1. Chest pain with history of coronary artery disease.  Patient has ruled out for ACS with negative cardiac markers.  Patient does complain of having heaviness in her chest, radiating to her left arm with associated shortness of breath and diaphoresis.  This was worse on exertion and relieved with rest.  Symptoms are concerning for typical anginal symptoms.  EKG does not show any acute changes.  Will continue with aspirin, beta-blocker.  She is statin intolerant.  She is already anticoagulated with Eliquis.  We will add low-dose Imdur.  She will need to be seen by cardiology to determine if further ischemic testing is needed.  Last catheterization was in 11/2015.  Will keep n.p.o. after midnight in case stress test is planned for morning. 2. Acute gout flare.  Patient complained of severe pain in her ankles bilaterally.  Uric acid was noted to be elevated at 9.6.  Radiographs were unremarkable.  She was started on colchicine and has clinically improved. 3. Chronic atrial fibrillation.  Anticoagulated with Eliquis.  Rate  controlled on Toprol. 4. Chronic systolic CHF.  Appears to be compensated.  Ejection fraction 30-35%.  Continue Lasix and beta-blockers. 5. Chronic back pain.  Stable.  Continue home regimen gabapentin, Robaxin and oxycodone 6. Chronic kidney disease stage III.  Creatinine appears to be near baseline.  Continue to monitor.   DVT prophylaxis: Eliquis Code Status: Full code Family Communication: No family present Disposition Plan: Discharge home once workup is complete   Consultants:     Procedures:     Antimicrobials:      Subjective: Has not had further chest pain since admission, but feels that this may recur when she exerts herself.  Objective: Vitals:   02/17/17 2055 02/18/17 0659 02/18/17 0927 02/18/17 1300  BP:  118/70  126/60  Pulse:  (!) 101  98  Resp:  18  16  Temp:  98.5 F (36.9 C)  98.7 F (37.1 C)  TempSrc:  Oral  Oral  SpO2: 95% 95% 94% 98%  Weight:      Height:        Intake/Output Summary (Last 24 hours) at 02/18/2017 1528 Last data filed at 02/18/2017 0915 Gross per 24 hour  Intake -  Output 1420 ml  Net -1420 ml   Filed Weights   02/17/17 1545 02/17/17 2041  Weight: 68 kg (150 lb) 64.4 kg (142 lb)    Examination:  General exam: Appears calm and comfortable  Respiratory system: Clear to auscultation. Respiratory effort normal. Cardiovascular system: S1 & S2 heard, RRR. No JVD, murmurs, rubs, gallops  or clicks. No pedal edema. Gastrointestinal system: Abdomen is nondistended, soft and nontender. No organomegaly or masses felt. Normal bowel sounds heard. Central nervous system: Alert and oriented. No focal neurological deficits. Extremities: Symmetric 5 x 5 power.  No tenderness in ankles bilaterally, she has increased range of motion Skin: Swelling and redness in ankles is better today. Psychiatry: Judgement and insight appear normal. Mood & affect appropriate.     Data Reviewed: I have personally reviewed following labs and imaging  studies  CBC: Recent Labs  Lab 02/17/17 1549 02/18/17 0256  WBC 12.8* 8.3  NEUTROABS 9.2*  --   HGB 12.2 11.0*  HCT 38.0 34.4*  MCV 82.3 82.7  PLT 441* 983   Basic Metabolic Panel: Recent Labs  Lab 02/17/17 1549 02/18/17 0256  NA 135 136  K 3.5 3.7  CL 96* 99*  CO2 27 30  GLUCOSE 137* 97  BUN 23* 23*  CREATININE 1.29* 1.25*  CALCIUM 9.6 9.1   GFR: Estimated Creatinine Clearance: 29.8 mL/min (A) (by C-G formula based on SCr of 1.25 mg/dL (H)). Liver Function Tests: Recent Labs  Lab 02/17/17 1552  AST 44*  ALT 30  ALKPHOS 285*  BILITOT 0.9  PROT 8.7*  ALBUMIN 3.8   No results for input(s): LIPASE, AMYLASE in the last 168 hours. No results for input(s): AMMONIA in the last 168 hours. Coagulation Profile: No results for input(s): INR, PROTIME in the last 168 hours. Cardiac Enzymes: Recent Labs  Lab 02/17/17 1549 02/17/17 2157 02/18/17 0255 02/18/17 0922  TROPONINI <0.03 0.03* 0.03* <0.03   BNP (last 3 results) No results for input(s): PROBNP in the last 8760 hours. HbA1C: No results for input(s): HGBA1C in the last 72 hours. CBG: No results for input(s): GLUCAP in the last 168 hours. Lipid Profile: No results for input(s): CHOL, HDL, LDLCALC, TRIG, CHOLHDL, LDLDIRECT in the last 72 hours. Thyroid Function Tests: No results for input(s): TSH, T4TOTAL, FREET4, T3FREE, THYROIDAB in the last 72 hours. Anemia Panel: No results for input(s): VITAMINB12, FOLATE, FERRITIN, TIBC, IRON, RETICCTPCT in the last 72 hours. Sepsis Labs: No results for input(s): PROCALCITON, LATICACIDVEN in the last 168 hours.  No results found for this or any previous visit (from the past 240 hour(s)).       Radiology Studies: Ct Chest W Contrast  Result Date: 02/17/2017 CLINICAL DATA:  Initial evaluation for acute chest pain, shortness of breath. EXAM: CT CHEST WITH CONTRAST TECHNIQUE: Multidetector CT imaging of the chest was performed during intravenous contrast  administration. CONTRAST:  78mL ISOVUE-300 IOPAMIDOL (ISOVUE-300) INJECTION 61% COMPARISON:  Prior radiograph from earlier the same day as well as prior CT from 09/30/2016. FINDINGS: Cardiovascular: Intrathoracic aorta of normal caliber without aneurysm or acute abnormality. Moderate atherosclerosis. Visualized great vessels within normal limits. Cardiomegaly. Diffuse 3 vessel coronary artery calcifications. No pericardial effusion. Limited evaluation of the pulmonary arterial tree grossly unremarkable. Mediastinum/Nodes: Visualized thyroid normal. 12 mm AP window node is decreased in size from previous, previously 16 mm). Mildly prominent subcarinal node also decreased measuring 13 mm, previously 17 mm). Mild soft tissue prominence within the right hilar region similar to slightly decreased in prominence. 7 mm left hilar node not significantly changed. No new adenopathy. No enlarged axillary nodes. Esophagus normal. Lungs/Pleura: Tracheobronchial tree intact and patent. Mild bibasilar interlobular septal thickening is similar to previous exam. Previously seen peripheral parenchymal opacity within the posterior oral lateral right lung persists but is improved relative to prior CT (series 4, image 46), now all less  dense in appearance. Additional patchy reticulonodular densities within the posterior right lower lobe are relatively similar. Additional reticulonodular opacity at the posterior left lower lobe slightly improved (series 4, image 75). No new focal infiltrates. 5 mm left upper lobe pulmonary nodule is relatively similar to previous, likely infectious or inflammatory (series 4, image 61). No pulmonary edema. No pleural effusion. No pneumothorax. Upper Abdomen: Visualized upper abdomen demonstrates no acute abnormality. Patient status post cholecystectomy. 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 Musculoskeletal: No acute osseus abnormality. Remote left-sided rib fractures noted. No  worrisome lytic or blastic osseous lesions. IMPRESSION: 1. Interval improvement and peripheral parenchymal lung disease involving the right upper lobe as compared to prior CT from 09/30/2016. Finding favored to reflect improving infectious or inflammatory process, with a neoplastic process felt to be unlikely given the interval improvement. Additional scattered lung opacities are also stable to slightly improved. Follow-up to resolution recommended. 2. Persistent but improved mediastinal and hilar adenopathy. Continued attention at follow-up recommended. 3. No other new cardiopulmonary abnormality. Electronically Signed   By: Jeannine Boga M.D.   On: 02/17/2017 18:59   Dg Chest Portable 1 View  Result Date: 02/17/2017 CLINICAL DATA:  Patient with chest pain and shortness of breath. EXAM: PORTABLE CHEST 1 VIEW COMPARISON:  Chest radiograph 09/30/2016. FINDINGS: Monitoring leads overlie the patient. Stable cardiomegaly. Aortic atherosclerosis. Pulmonary vascular redistribution and bilateral interstitial pulmonary opacities, unchanged from prior. Additionally there is persistent focal consolidation within the right upper lobe. No pleural effusion or pneumothorax. IMPRESSION: Persistent focal consolidative opacity within the right upper lobe. This needs further evaluated with chest CT. Cardiomegaly. Chronic interstitial opacities bilaterally. Electronically Signed   By: Lovey Newcomer M.D.   On: 02/17/2017 16:44   Dg Foot Complete Right  Result Date: 02/17/2017 CLINICAL DATA:  Bilateral foot pain and swelling for the past week. No known injury. EXAM: RIGHT FOOT COMPLETE - 3+ VIEW COMPARISON:  05/17/2016. FINDINGS: Stable second metatarsal bone island. Stable calcaneal spurs. No visible soft tissue swelling. IMPRESSION: No acute abnormality. Electronically Signed   By: Claudie Revering M.D.   On: 02/17/2017 16:44        Scheduled Meds: . apixaban  5 mg Oral BID  . [START ON 02/19/2017] colchicine  0.6 mg  Oral Daily  . DULoxetine  60 mg Oral QHS  . fluticasone  2 spray Each Nare Daily  . furosemide  40 mg Oral Daily  . gabapentin  300 mg Oral TID  . ipratropium-albuterol  3 mL Nebulization Q6H WA  . magnesium oxide  400 mg Oral Daily  . metoprolol succinate  150 mg Oral Daily  . multivitamin with minerals  1 tablet Oral Q lunch  . pantoprazole  40 mg Oral Daily  . potassium chloride  20 mEq Oral BID   Continuous Infusions:   LOS: 0 days    Time spent: 55mins    Kenna Kirn, MD [facility] Pager [phone]  If 7PM-7AM, please contact night-coverage [email or web address] Password [email or web address] 02/18/2017, 3:28 PM

## 2017-02-19 ENCOUNTER — Encounter (HOSPITAL_COMMUNITY): Payer: Self-pay | Admitting: Adult Health

## 2017-02-19 DIAGNOSIS — I429 Cardiomyopathy, unspecified: Secondary | ICD-10-CM | POA: Diagnosis not present

## 2017-02-19 DIAGNOSIS — F418 Other specified anxiety disorders: Secondary | ICD-10-CM | POA: Diagnosis present

## 2017-02-19 DIAGNOSIS — I480 Paroxysmal atrial fibrillation: Secondary | ICD-10-CM | POA: Diagnosis not present

## 2017-02-19 DIAGNOSIS — Z981 Arthrodesis status: Secondary | ICD-10-CM | POA: Diagnosis not present

## 2017-02-19 DIAGNOSIS — J449 Chronic obstructive pulmonary disease, unspecified: Secondary | ICD-10-CM | POA: Diagnosis present

## 2017-02-19 DIAGNOSIS — M109 Gout, unspecified: Secondary | ICD-10-CM | POA: Diagnosis not present

## 2017-02-19 DIAGNOSIS — Z23 Encounter for immunization: Secondary | ICD-10-CM | POA: Diagnosis not present

## 2017-02-19 DIAGNOSIS — E7220 Disorder of urea cycle metabolism, unspecified: Secondary | ICD-10-CM | POA: Diagnosis not present

## 2017-02-19 DIAGNOSIS — Z955 Presence of coronary angioplasty implant and graft: Secondary | ICD-10-CM | POA: Diagnosis not present

## 2017-02-19 DIAGNOSIS — I5022 Chronic systolic (congestive) heart failure: Secondary | ICD-10-CM | POA: Diagnosis not present

## 2017-02-19 DIAGNOSIS — I25119 Atherosclerotic heart disease of native coronary artery with unspecified angina pectoris: Secondary | ICD-10-CM

## 2017-02-19 DIAGNOSIS — M5440 Lumbago with sciatica, unspecified side: Secondary | ICD-10-CM | POA: Diagnosis not present

## 2017-02-19 DIAGNOSIS — I482 Chronic atrial fibrillation: Secondary | ICD-10-CM | POA: Diagnosis present

## 2017-02-19 DIAGNOSIS — Z888 Allergy status to other drugs, medicaments and biological substances status: Secondary | ICD-10-CM | POA: Diagnosis not present

## 2017-02-19 DIAGNOSIS — Z96651 Presence of right artificial knee joint: Secondary | ICD-10-CM | POA: Diagnosis present

## 2017-02-19 DIAGNOSIS — R0789 Other chest pain: Secondary | ICD-10-CM | POA: Diagnosis not present

## 2017-02-19 DIAGNOSIS — I4891 Unspecified atrial fibrillation: Secondary | ICD-10-CM | POA: Diagnosis not present

## 2017-02-19 DIAGNOSIS — I2510 Atherosclerotic heart disease of native coronary artery without angina pectoris: Secondary | ICD-10-CM | POA: Diagnosis not present

## 2017-02-19 DIAGNOSIS — M542 Cervicalgia: Secondary | ICD-10-CM | POA: Diagnosis not present

## 2017-02-19 DIAGNOSIS — M545 Low back pain: Secondary | ICD-10-CM | POA: Diagnosis present

## 2017-02-19 DIAGNOSIS — I259 Chronic ischemic heart disease, unspecified: Secondary | ICD-10-CM | POA: Diagnosis not present

## 2017-02-19 DIAGNOSIS — R748 Abnormal levels of other serum enzymes: Secondary | ICD-10-CM | POA: Diagnosis not present

## 2017-02-19 DIAGNOSIS — G253 Myoclonus: Secondary | ICD-10-CM | POA: Diagnosis not present

## 2017-02-19 DIAGNOSIS — N183 Chronic kidney disease, stage 3 (moderate): Secondary | ICD-10-CM | POA: Diagnosis not present

## 2017-02-19 DIAGNOSIS — I5042 Chronic combined systolic (congestive) and diastolic (congestive) heart failure: Secondary | ICD-10-CM | POA: Diagnosis present

## 2017-02-19 DIAGNOSIS — I130 Hypertensive heart and chronic kidney disease with heart failure and stage 1 through stage 4 chronic kidney disease, or unspecified chronic kidney disease: Secondary | ICD-10-CM | POA: Diagnosis present

## 2017-02-19 DIAGNOSIS — G4733 Obstructive sleep apnea (adult) (pediatric): Secondary | ICD-10-CM | POA: Diagnosis present

## 2017-02-19 DIAGNOSIS — Z881 Allergy status to other antibiotic agents status: Secondary | ICD-10-CM | POA: Diagnosis not present

## 2017-02-19 DIAGNOSIS — R072 Precordial pain: Secondary | ICD-10-CM | POA: Diagnosis not present

## 2017-02-19 DIAGNOSIS — I252 Old myocardial infarction: Secondary | ICD-10-CM | POA: Diagnosis not present

## 2017-02-19 DIAGNOSIS — R079 Chest pain, unspecified: Secondary | ICD-10-CM | POA: Diagnosis not present

## 2017-02-19 DIAGNOSIS — F329 Major depressive disorder, single episode, unspecified: Secondary | ICD-10-CM | POA: Diagnosis not present

## 2017-02-19 DIAGNOSIS — I10 Essential (primary) hypertension: Secondary | ICD-10-CM | POA: Diagnosis not present

## 2017-02-19 DIAGNOSIS — Z880 Allergy status to penicillin: Secondary | ICD-10-CM | POA: Diagnosis not present

## 2017-02-19 DIAGNOSIS — I5032 Chronic diastolic (congestive) heart failure: Secondary | ICD-10-CM

## 2017-02-19 DIAGNOSIS — Z885 Allergy status to narcotic agent status: Secondary | ICD-10-CM | POA: Diagnosis not present

## 2017-02-19 DIAGNOSIS — E785 Hyperlipidemia, unspecified: Secondary | ICD-10-CM | POA: Diagnosis present

## 2017-02-19 DIAGNOSIS — G8929 Other chronic pain: Secondary | ICD-10-CM | POA: Diagnosis not present

## 2017-02-19 DIAGNOSIS — D72829 Elevated white blood cell count, unspecified: Secondary | ICD-10-CM | POA: Diagnosis present

## 2017-02-19 DIAGNOSIS — H269 Unspecified cataract: Secondary | ICD-10-CM | POA: Diagnosis present

## 2017-02-19 DIAGNOSIS — I25110 Atherosclerotic heart disease of native coronary artery with unstable angina pectoris: Secondary | ICD-10-CM | POA: Diagnosis present

## 2017-02-19 DIAGNOSIS — K219 Gastro-esophageal reflux disease without esophagitis: Secondary | ICD-10-CM | POA: Diagnosis present

## 2017-02-19 LAB — BLOOD GAS, ARTERIAL
ACID-BASE DEFICIT: 0 mmol/L (ref 0.0–2.0)
BICARBONATE: 24.2 mmol/L (ref 20.0–28.0)
Drawn by: 28459
FIO2: 28
O2 SAT: 97.6 %
PCO2 ART: 43.5 mmHg (ref 32.0–48.0)
PO2 ART: 109 mmHg — AB (ref 83.0–108.0)
Patient temperature: 37
pH, Arterial: 7.37 (ref 7.350–7.450)

## 2017-02-19 LAB — AMMONIA: AMMONIA: 70 umol/L — AB (ref 9–35)

## 2017-02-19 LAB — BASIC METABOLIC PANEL
Anion gap: 10 (ref 5–15)
BUN: 28 mg/dL — AB (ref 6–20)
CHLORIDE: 98 mmol/L — AB (ref 101–111)
CO2: 26 mmol/L (ref 22–32)
CREATININE: 1.45 mg/dL — AB (ref 0.44–1.00)
Calcium: 9 mg/dL (ref 8.9–10.3)
GFR calc Af Amer: 38 mL/min — ABNORMAL LOW (ref >60)
GFR calc non Af Amer: 33 mL/min — ABNORMAL LOW (ref >60)
GLUCOSE: 124 mg/dL — AB (ref 65–99)
POTASSIUM: 5.1 mmol/L (ref 3.5–5.1)
SODIUM: 134 mmol/L — AB (ref 135–145)

## 2017-02-19 LAB — TSH: TSH: 5.881 u[IU]/mL — AB (ref 0.350–4.500)

## 2017-02-19 MED ORDER — METOPROLOL SUCCINATE ER 50 MG PO TB24
200.0000 mg | ORAL_TABLET | Freq: Every day | ORAL | Status: DC
  Administered 2017-02-19 – 2017-02-21 (×3): 200 mg via ORAL
  Filled 2017-02-19 (×3): qty 4

## 2017-02-19 MED ORDER — DOCUSATE SODIUM 100 MG PO CAPS
100.0000 mg | ORAL_CAPSULE | Freq: Two times a day (BID) | ORAL | Status: DC
  Administered 2017-02-19 – 2017-02-20 (×2): 100 mg via ORAL
  Filled 2017-02-19 (×4): qty 1

## 2017-02-19 MED ORDER — HYDROCORTISONE ACETATE 25 MG RE SUPP
25.0000 mg | Freq: Two times a day (BID) | RECTAL | Status: DC
  Administered 2017-02-19 – 2017-02-20 (×2): 25 mg via RECTAL
  Filled 2017-02-19 (×3): qty 1

## 2017-02-19 NOTE — Consult Note (Addendum)
Cardiology Consultation:   Patient ID: Tonya WHITSEL; [id number]; [birth year]   Admit date: 02/17/2017 Date of Consult: 02/19/2017  Primary Care Provider: Tawni Carnes, PA-C Primary Cardiologist: Kate Sable, MD  Patient Profile:   [name] is a 83 y.o. female with a hx of diastolic heart failure, atrial fibrillation on Eliquis,, coronary artery disease with most recent cardiac catheterization at 12/16/2015 revealing 20% stenosis of the proximal LAD, and in the inferior branch of the first diagonal with a widely patent previously placed circumflex stent, RCA was small and nondominant with an 80% ostial stenosis.  Medical therapy was recommended.  Other history includes chronic kidney disease stage III, chronic low back pain, and depression.  Who is being seen today for the evaluation of recurrent chest pain at the request of Dr. Roderic Palau.   History of Present Illness:   [name] presented to the emergency room with complaints of foot pain, epigastric pain, and intermittent chest pain.  She was diagnosed with gout, and admitted to rule out ACS.  She reports discomfort in her legs and feet approximately 1 week ago which had increased in intensity. Some LEE was also noted.   She then began to have epigastric discomfort.  She states that if she held her breath the pain would go away.  It is not elicited by deep breathing, however, reporting that she held her breath each time her feet caused her more intense pain. She  did have some nausea and vomiting and states that she did not eat for 2 days prior to coming in.  Breathing status has remained unchanged.  She denied coughing or chest congestion.  On arrival to  the emergency room the patient's blood pressure is 123/83 heart rate 108 O2 sat 92% she was afebrile.  EKG revealed atrial fibrillation with elevated heart rate, nonspecific ST-T wave abnormalities, essentially unchanged from prior EKG in June 2018 with the exception of heart rate.   Troponin has been negative x3 at 0.03.  Other pertinent labs revealed a creatinine of 1.25, BUN of 23, potassium 3.7, hemoglobin 11.0 hematocrit of 34.4.  Uric acid level was elevated at 9.6.  CT scan with contrast revealed improvement in peripheral parenchymal lung disease involving the right upper lobe compared to previous CT in June 2018.  Findings favoring improving infectious or inflammatory process.  No other new cardiopulmonary abnormality. She was treated with hydrocodone Zofran.  Hospitalist place patient on low-dose Imdur.  She has been kept n.p.o. in anticipation of need for stress test.  She now reports that her symptoms are some better.  She is able to point to the epigastric area where she feels discomfort.  It is not elicited by palpation.  Past Medical History:  Diagnosis Date  . Anxiety   . Arthritis   . Atrial fibrillation ([place])   . Bursitis    Left shoulder  . Cataract   . COPD (chronic obstructive pulmonary disease) ([place])   . Coronary atherosclerosis of native coronary artery    a. DES to CX, moderately severe stenosis RCA, mild stenosis LAD 04/2013  . Depression   . Diastolic heart failure ([facility])    EF 55-60%  . Dysphagia, unspecified(787.20)   . Essential hypertension   . GERD (gastroesophageal reflux disease)    Hx Schatzki's ring, multiple EGD/ED last 01/06/2004  . Gout   . Headache   . History of anemia   . Hyperlipidemia   . Internal hemorrhoids without mention of complication   . MI (  myocardial infarction) ([place]) 2006  . Microscopic colitis 2003  . OSA (obstructive sleep apnea)   . Panic disorder without agoraphobia   . Paresthesia   . Pneumonia 12/2011  . PVD (peripheral vascular disease) ([place])   . S/P colonoscopy 09/27/2001   internal hemorrhoids, desc colon inflam polyp, SB BX-chronic duodenitis, colitis  . Thyroid disease     Past Surgical History:  Procedure Laterality Date  . ABDOMINAL HYSTERECTOMY    . ABDOMINAL HYSTERECTOMY    . ANTERIOR AND  POSTERIOR REPAIR     with resection of vagina  . APPENDECTOMY    . BACK SURGERY    . bladder tack  06/2010  . BREAST LUMPECTOMY  1998   left, benign  . CARDIAC CATHETERIZATION    . CARDIAC CATHETERIZATION    . [place]   left  . CHOLECYSTECTOMY  1998  . Cholecystectomy    . COLONOSCOPY  03/16/2011   multiple hyperplastic colon polyps, sigmoid diverticulosis, melanosis coli  . CORONARY ANGIOPLASTY WITH STENT PLACEMENT    . JOINT REPLACEMENT Right 2007   right knee  . left hand surgery    . left rotator cuff surgery    . NASAL SINUS SURGERY    . right knee replacement  2007  . right leg benign tumor    . SHOULDER SURGERY Left   . TONSILLECTOMY    . unspecified area, hysterectomy  1972   partial     Inpatient Medications: Scheduled Meds: . apixaban  5 mg Oral BID  . colchicine  0.6 mg Oral Daily  . DULoxetine  60 mg Oral QHS  . fluticasone  2 spray Each Nare Daily  . furosemide  40 mg Oral Daily  . gabapentin  300 mg Oral TID  . ipratropium-albuterol  3 mL Nebulization Q6H WA  . isosorbide mononitrate  30 mg Oral Daily  . magnesium oxide  400 mg Oral Daily  . metoprolol succinate  200 mg Oral Daily  . multivitamin with minerals  1 tablet Oral Q lunch  . pantoprazole  40 mg Oral Daily  . potassium chloride  20 mEq Oral BID    PRN Meds: acetaminophen, ALPRAZolam, bisacodyl, methocarbamol, nitroGLYCERIN, ondansetron (ZOFRAN) IV, oxyCODONE  Allergies:    Allergies  Allergen Reactions  . Cephalosporins Diarrhea and Nausea Only    Lightheaded  . Levaquin [Levofloxacin In D5w] Swelling  . Macrodantin [Nitrofurantoin Macrocrystal] Swelling  . Phenothiazines Anaphylaxis and Hives  . Polysorbate Anaphylaxis  . Prednisone Shortness Of Breath  . Buspirone Itching  . Cardura [Doxazosin Mesylate] Itching  . Codeine Itching  . Acyclovir And Related Itching    Redness of skin  . Prochlorperazine Other (See Comments)    "Upset stomach"  . Ranexa  [Ranolazine]     Severe drop in BP  . Atorvastatin Hives    Cramping; tolerates Crestor ok  . Ofloxacin Rash  . Other Itching and Rash    "WOOL"= make skin look like it has been burned  . Penicillins Other (See Comments)    Causes redness all over. Has patient had a PCN reaction causing immediate rash, facial/tongue/throat swelling, SOB or lightheadedness with hypotension: No Has patient had a PCN reaction causing severe rash involving mucus membranes or skin necrosis: No Has patient had a PCN reaction that required hospitalization No Has patient had a PCN reaction occurring within the last 10 years: No If all of the above answers are "NO", then may proceed with Cephalosporin use.   [name] Kitchen  Pimozide Hives and Itching    Social History:   Social History   Socioeconomic History  . Marital status: Divorced    Spouse name: Not on file  . Number of children: 5  . Years of education: Not on file  . Highest education level: Not on file  Social Needs  . Financial resource strain: Not on file  . Food insecurity - worry: Not on file  . Food insecurity - inability: Not on file  . Transportation needs - medical: Not on file  . Transportation needs - non-medical: Not on file  Occupational History  . Occupation: retired  Tobacco Use  . Smoking status: Former Smoker    Packs/day: 1.00    Years: 64.00    Pack years: 64.00    Types: Cigarettes    Start date: 12/24/1947    Last attempt to quit: 11/17/2001    Years since quitting: 15.2  . Smokeless tobacco: Never Used  . Tobacco comment: Quit smoking in 2003  Substance and Sexual Activity  . Alcohol use: No    Alcohol/week: 0.0 oz  . Drug use: No  . Sexual activity: No  Other Topics Concern  . Not on file  Social History Narrative   Divorced.   Sister had colon perforation & died from complications in [place], [place]    Family History:    Family History  Problem Relation Age of Onset  . Stroke Mother   . Parkinson's disease Father   .  Coronary artery disease Other        family Hx-sons  . Cancer Other   . Stroke Other        family Hx  . Hypertension Other        family Hx  . Diabetes Brother   . Heart disease Son        before age 57  . Diabetes Son   . Stroke Daughter 36  . Colon cancer Neg Hx      ROS:  Please see the history of present illness.  ROS  Shortness of breath, palpitations.  All other ROS reviewed and negative.     Physical Exam/Data:   Vitals:   02/18/17 2032 02/19/17 0630 02/19/17 0800 02/19/17 0857  BP: 128/85 121/64 (!) 142/78   Pulse: 86 (!) 143 (!) 102   Resp: 16 16    Temp: 98.4 F (36.9 C) 97.7 F (36.5 C) 98.2 F (36.8 C)   TempSrc: Oral Oral Oral   SpO2: 99% 98% 99% 98%  Weight:  145 lb 14.4 oz (66.2 kg)    Height:        Intake/Output Summary (Last 24 hours) at 02/19/2017 0935 Last data filed at 02/18/2017 2145 Gross per 24 hour  Intake 240 ml  Output 1350 ml  Net -1110 ml   Filed Weights   02/17/17 1545 02/17/17 2041 02/19/17 0630  Weight: 150 lb (68 kg) 142 lb (64.4 kg) 145 lb 14.4 oz (66.2 kg)   Body mass index is 27.57 kg/m.   General: Chronically ill-appearing elderly woman, no distress.   HEENT: normal Lymph: no adenopathy Neck: no JVD Endocrine:  No thryomegaly Vascular: No carotid bruits; FA pulses 2+ bilaterally without bruits  Cardiac:  IRRR; soft systolic murmur.  Lungs: Some mild crackles bibasilar and in the R lobe.  Abd: soft, tenderness in the right upper quadrant to palpation.  Ext: no edema Musculoskeletal:  No deformities, mild erythema of the great toe bilaterally, with erythema of the left ankle.  Skin: warm and dry  Neuro:  CNs 2-12 intact, no focal abnormalities noted Psych:  Normal affect Anxious   EKG:  The ECG was personally reviewed and demonstrates: Atrial fib with RVR  Telemetry:  Telemetry was personally reviewed and demonstrates: Atrial fib rates in the low 100's with 103-109.   Relevant CV Studies: Cardiac Catheterization,  12/16/2015.     Ost RCA lesion, 80 %stenosed.  Ost Cx to Prox Cx lesion, 0 %stenosed.  Ost LAD to Prox LAD lesion, 20 %stenosed.  1st Diag lesion, 20 %stenosed.  4th Mrg lesion, 20 %stenosed.  The left ventricular systolic function is normal.  LV end diastolic pressure is mildly elevated.  The left ventricular ejection fraction is 60-65% by visual estimate.   Normal LV function with an ejection fraction of 60-65%.  There is evidence for left ventricular hypertrophy.  Coronary obstructive disease with evidence for smooth eccentric plaque in the proximal LAD of 20%, 20% stenosis in the inferior branch of the first diagonal vessel; a widely patent previously placed left circumflex stent extending from the ostium to the first marginal takeoff in a large dominant circumflex vessel with 20% distal narrowing; and a small nondominant RCA with 80% ostial stenosis.  RECOMMENDATION: Medical therapy.  Of note, the patient does have significant change in respiratory intrathoracic pressure from inspiratory and expiratory cycles.    Echocardiogram  04/17/2016 Left ventricle: The cavity size was normal. There was mild   concentric hypertrophy. Systolic function was moderately to   severely reduced. The estimated ejection fraction was in the   range of 30% to 35%. Wall motion was normal; there were no   regional wall motion abnormalities. - Aortic valve: Mildly calcified annulus. Trileaflet; normal   thickness, mildly calcified leaflets. There was mild   regurgitation. - Mitral valve: There was moderate regurgitation. - Left atrium: The atrium was moderately dilated. - Pulmonary arteries: Systolic pressure was mildly increased. PA   peak pressure: 38 mm Hg (S). - Pericardium, extracardiac: A small pericardial effusion was   identified.  Laboratory Data:  Chemistry Recent Labs  Lab 02/17/17 1549 02/18/17 0256 02/19/17 0514  NA 135 136 134*  K 3.5 3.7 5.1  CL 96* 99* 98*  CO2  27 30 26   GLUCOSE 137* 97 124*  BUN 23* 23* 28*  CREATININE 1.29* 1.25* 1.45*  CALCIUM 9.6 9.1 9.0  GFRNONAA 37* 39* 33*  GFRAA 43* 45* 38*  ANIONGAP 12 7 10     Recent Labs  Lab 02/17/17 1552  PROT 8.7*  ALBUMIN 3.8  AST 44*  ALT 30  ALKPHOS 285*  BILITOT 0.9   Hematology Recent Labs  Lab 02/17/17 1549 02/18/17 0256  WBC 12.8* 8.3  RBC 4.62 4.16  HGB 12.2 11.0*  HCT 38.0 34.4*  MCV 82.3 82.7  MCH 26.4 26.4  MCHC 32.1 32.0  RDW 17.3* 17.0*  PLT 441* 372   Cardiac Enzymes Recent Labs  Lab 02/17/17 1549 02/17/17 2157 02/18/17 0255 02/18/17 0922  TROPONINI <0.03 0.03* 0.03* <0.03   No results for input(s): TROPIPOC in the last 168 hours.  BNP Recent Labs  Lab 02/17/17 1549  BNP 589.0*    Radiology/Studies:  Ct Chest W Contrast  Result Date: 02/17/2017 CLINICAL DATA:  Initial evaluation for acute chest pain, shortness of breath. EXAM: CT CHEST WITH CONTRAST TECHNIQUE: Multidetector CT imaging of the chest was performed during intravenous contrast administration. CONTRAST:  66mL ISOVUE-300 IOPAMIDOL (ISOVUE-300) INJECTION 61% COMPARISON:  Prior radiograph from earlier the same day  as well as prior CT from 09/30/2016. FINDINGS: Cardiovascular: Intrathoracic aorta of normal caliber without aneurysm or acute abnormality. Moderate atherosclerosis. Visualized great vessels within normal limits. Cardiomegaly. Diffuse 3 vessel coronary artery calcifications. No pericardial effusion. Limited evaluation of the pulmonary arterial tree grossly unremarkable. Mediastinum/Nodes: Visualized thyroid normal. 12 mm AP window node is decreased in size from previous, previously 16 mm). Mildly prominent subcarinal node also decreased measuring 13 mm, previously 17 mm). Mild soft tissue prominence within the right hilar region similar to slightly decreased in prominence. 7 mm left hilar node not significantly changed. No new adenopathy. No enlarged axillary nodes. Esophagus normal.  Lungs/Pleura: Tracheobronchial tree intact and patent. Mild bibasilar interlobular septal thickening is similar to previous exam. Previously seen peripheral parenchymal opacity within the posterior oral lateral right lung persists but is improved relative to prior CT (series 4, image 46), now all less dense in appearance. Additional patchy reticulonodular densities within the posterior right lower lobe are relatively similar. Additional reticulonodular opacity at the posterior left lower lobe slightly improved (series 4, image 75). No new focal infiltrates. 5 mm left upper lobe pulmonary nodule is relatively similar to previous, likely infectious or inflammatory (series 4, image 61). No pulmonary edema. No pleural effusion. No pneumothorax. Upper Abdomen: Visualized upper abdomen demonstrates no acute abnormality. Patient status post cholecystectomy. 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 Musculoskeletal: No acute osseus abnormality. Remote left-sided rib fractures noted. No worrisome lytic or blastic osseous lesions. IMPRESSION: 1. Interval improvement and peripheral parenchymal lung disease involving the right upper lobe as compared to prior CT from 09/30/2016. Finding favored to reflect improving infectious or inflammatory process, with a neoplastic process felt to be unlikely given the interval improvement. Additional scattered lung opacities are also stable to slightly improved. Follow-up to resolution recommended. 2. Persistent but improved mediastinal and hilar adenopathy. Continued attention at follow-up recommended. 3. No other new cardiopulmonary abnormality. Electronically Signed   By: Jeannine Boga M.D.   On: 02/17/2017 18:59   Dg Chest Portable 1 View  Result Date: 02/17/2017 CLINICAL DATA:  Patient with chest pain and shortness of breath. EXAM: PORTABLE CHEST 1 VIEW COMPARISON:  Chest radiograph 09/30/2016. FINDINGS: Monitoring leads overlie the patient. Stable  cardiomegaly. Aortic atherosclerosis. Pulmonary vascular redistribution and bilateral interstitial pulmonary opacities, unchanged from prior. Additionally there is persistent focal consolidation within the right upper lobe. No pleural effusion or pneumothorax. IMPRESSION: Persistent focal consolidative opacity within the right upper lobe. This needs further evaluated with chest CT. Cardiomegaly. Chronic interstitial opacities bilaterally. Electronically Signed   By: Lovey Newcomer M.D.   On: 02/17/2017 16:44   Dg Foot Complete Right  Result Date: 02/17/2017 CLINICAL DATA:  Bilateral foot pain and swelling for the past week. No known injury. EXAM: RIGHT FOOT COMPLETE - 3+ VIEW COMPARISON:  05/17/2016. FINDINGS: Stable second metatarsal bone island. Stable calcaneal spurs. No visible soft tissue swelling. IMPRESSION: No acute abnormality. Electronically Signed   By: Claudie Revering M.D.   On: 02/17/2017 16:44    Assessment and Plan:   1. Chest Pain: Typical and atypical features, located in the epigastric area, occurred after nausea and vomiting as well as complaints of pain in her lower extremities.  States that it comes and goes and is sharp in description.  It does not worsen with deep breathing.  It is not radiating.  EKG and troponins argue against ACS.  She states the discomfort in her chest is some better.  She has been placed on  isosorbide mononitrate 30 mg daily.  Do not anticipate need for stress test at this time.  2. CAD: Known history of to the circumflex artery, with ostial 20% LAD, and ostial right coronary artery stenosis of 80% which was planned to be treated medically.  She is recently been placed on nitrates.  Continue metoprolol succinate 150 mg daily.  She is not on ACE or ARB due to chronic kidney disease.  3.  Atrial fibrillation: Heart rate is not currently controlled.  May be related to discomfort.  She remains on long-acting metoprolol 150 mg daily.  Blood pressure is not optimal.   We will increase metoprolol to 200 mg daily for better heart rate control as her blood pressure is able to tolerate this.  Continue Eliquis.  There is no evidence of significant anemia.  4.  Chronic Kidney Disease Stage III: Creatinine 1.45 this a.m.  BUN 28.  5.  Acute Gout: Only on colchicine for symptomatic relief.  6.  Right upper quadrant pain with associated nausea and vomiting: LFTs only have minimal elevation.  She states she has had a cholecystectomy.  She remains on PPI.  Consider GI evaluation if clinically warranted.  For questions or updates, please contact [facility] Please consult [email or web address] for contact info under Cardiology/STEMI.   Olena Heckle, DNP 02/19/2017 9:35 AM    Attending note:  Patient seen and examined.  Reviewed extensive records and discussed the case with Ms. West Pugh.  Ms. Ohms presents to the hospital with various complaints including foot pain, epigastric discomfort radiating into the chest, palpitations, intermittent shortness of breath.  She has been diagnosed with gout flare, also admitted for observation to rule out ACS.  She has a history of CAD status post DES to the circumflex.  Cardiac catheterization in August 2017 revealed nonobstructive LAD disease, widely patent circumflex stent, and a small nondominant RCA with 80% ostial stenosis that was managed medically.  Echocardiogram in January of this year revealed LVEF 30-35% range.  On examination she complains of foot pain and palpitations.  She is in atrial fibrillation with elevated heart rate on my personal review of telemetry.  Blood pressure in the 120-140 range.  Heart rates are generally 100-120 range.  Lungs exhibit decreased breath sounds without wheezing.  Cardiac exam reveals irregularly irregular rhythm.  Extremities show mild bilateral ankle edema, some increased warmth around the left lateral malleolus.  Lab work shows BUN 28, creatinine 1.45, peak troponin I level  0.03, hemoglobin 11.0.  Chest CT imaging reports interval improvement in previously documented parenchymal lung disease of the right upper lobe, persistent but improved mediastinal and hilar adenopathy, no other acute cardiopulmonary findings.  I personally reviewed her ECG from 02/18/2017 which shows atrial fibrillation with mildly increased heart rate, diffuse nonspecific ST-T changes.  Patient admitted with multiple symptoms including epigastric and chest discomfort.  No clear evidence of ACS based on troponin I trend.  ECG is nonspecific.  She does have atrial fibrillation with inadequately controlled heart rate which could be contributing.  At this point would not push forward with Myoview, rather adjust medications aimed at heart rate control, agree with addition of Imdur as well.  It may well be be that her cardiomyopathy is tachycardia-mediated.  Satira Sark, M.D., F.A.C.C.

## 2017-02-19 NOTE — Progress Notes (Signed)
PROGRESS NOTE    [name]  KVQ:[id number] DOB: 29-Jan-1933 DOA: 02/17/2017 PCP: Tawni Carnes, PA-C    Brief Narrative:  83 year old female with history of coronary artery disease, chronic systolic CHF atrial fibrillation, presents to the hospital with chest pain concerning for angina.  She also noted to have acute gouty arthritis in her ankles bilaterally.  She is ruled out for ACS with negative cardiac markers and nonacute EKG.  She will need cardiology clearance prior to discharge.  Acute gout attack has improved with colchicine.   Assessment & Plan:   Principal Problem:   Chest pain Active Problems:   CAD, NATIVE VESSEL - PCI + DES to left circumflex 05/14/13   Chronic diastolic heart failure (HCC)   Major depression (HCC)   PAF (paroxysmal atrial fibrillation) (HCC)   Chronic low back pain   Chronic kidney disease, stage III (moderate) (HCC)   Acute gout   Atrial fibrillation ([place])   1. Chest pain with history of coronary artery disease.  Patient has ruled out for ACS with negative cardiac markers.  Patient was complaining of some chest pain.  EKG was nonspecific.  Cardiology following and does not feel that further ischemic testing is needed at this time.  Will continue with aspirin, beta-blocker.  She is statin intolerant.  She is already anticoagulated with Eliquis.  Imdur has also been added to her regimen.  It is felt that her rapid atrial fibrillation may be contributing to her symptoms. 2. Acute gout flare.  Patient complained of severe pain in her ankles bilaterally.  Uric acid was noted to be elevated at 9.6.  Radiographs were unremarkable.  She was started on colchicine and has clinically improved. 3. Chronic atrial fibrillation.  Patient having episodes of tachycardia.  Toprol dose has been increased.  Continue to monitor on telemetry.  Anticoagulated with Eliquis.   4. Chronic systolic CHF.  Appears to be compensated.  Ejection fraction 30-35%.  Continue Lasix and  beta-blockers. 5. Chronic back pain.  Stable.  Continue home regimen gabapentin, Robaxin and oxycodone 6. Chronic kidney disease stage III.  Creatinine appears to be near baseline.  Continue to monitor. 7. Myoclonic jerks.  Patient noted to have myoclonic jerking in her lower extremity/upper extremities bilaterally.  She reports that this is been progressive over the past several weeks.  She has been unable to walk due to this jerking.  She is hyperreflexic in her lower extremities bilaterally.  We will check MRI of the C-spine.  Check TSH, B12, CO2, ammonia.  Will likely need to follow-up with neurology as an outpatient.   DVT prophylaxis: Eliquis Code Status: Full code Family Communication: No family present Disposition Plan: Discharge home once workup is complete   Consultants:   Cardiology  Procedures:     Antimicrobials:      Subjective: Still has some soreness in her ankles, but overall improving.  She reports that she has been having jerking movements in her legs and arms bilaterally for several weeks now.  She has been unable to walk due to this jerking.  Objective: Vitals:   02/19/17 0857 02/19/17 1012 02/19/17 1500 02/19/17 1502  BP:  120/60 139/78   Pulse:   94   Resp:   19   Temp:   98.3 F (36.8 C)   TempSrc:   Oral   SpO2: 98%  99% 96%  Weight:      Height:        Intake/Output Summary (Last 24 hours) at 02/19/2017  [place] filed at 02/19/2017 1700 Gross per 24 hour  Intake 1440 ml  Output 1450 ml  Net -10 ml   Filed Weights   02/17/17 1545 02/17/17 2041 02/19/17 0630  Weight: 68 kg (150 lb) 64.4 kg (142 lb) 66.2 kg (145 lb 14.4 oz)    Examination:  General exam: Appears calm and comfortable  Respiratory system: Clear to auscultation. Respiratory effort normal. Cardiovascular system: S1 & S2 heard, irregular. No JVD, murmurs, rubs, gallops or clicks. No pedal edema. Gastrointestinal system: Abdomen is nondistended, soft and nontender. No  organomegaly or masses felt. Normal bowel sounds heard. Central nervous system: Alert and oriented. No focal neurological deficits.  Appears to be hyperreflexic on lower extremities bilaterally.  Myoclonic jerks noted in legs and arms bilaterally. Extremities: Symmetric 5 x 5 power.  No tenderness in ankles bilaterally, she has increased range of motion Skin: Swelling and redness in ankles is better today. Psychiatry: Judgement and insight appear normal. Mood & affect appropriate.     Data Reviewed: I have personally reviewed following labs and imaging studies  CBC: Recent Labs  Lab 02/17/17 1549 02/18/17 0256  WBC 12.8* 8.3  NEUTROABS 9.2*  --   HGB 12.2 11.0*  HCT 38.0 34.4*  MCV 82.3 82.7  PLT 441* 948   Basic Metabolic Panel: Recent Labs  Lab 02/17/17 1549 02/18/17 0256 02/19/17 0514  NA 135 136 134*  K 3.5 3.7 5.1  CL 96* 99* 98*  CO2 27 30 26   GLUCOSE 137* 97 124*  BUN 23* 23* 28*  CREATININE 1.29* 1.25* 1.45*  CALCIUM 9.6 9.1 9.0   GFR: Estimated Creatinine Clearance: 26.1 mL/min (A) (by C-G formula based on SCr of 1.45 mg/dL (H)). Liver Function Tests: Recent Labs  Lab 02/17/17 1552  AST 44*  ALT 30  ALKPHOS 285*  BILITOT 0.9  PROT 8.7*  ALBUMIN 3.8   No results for input(s): LIPASE, AMYLASE in the last 168 hours. No results for input(s): AMMONIA in the last 168 hours. Coagulation Profile: No results for input(s): INR, PROTIME in the last 168 hours. Cardiac Enzymes: Recent Labs  Lab 02/17/17 1549 02/17/17 2157 02/18/17 0255 02/18/17 0922  TROPONINI <0.03 0.03* 0.03* <0.03   BNP (last 3 results) No results for input(s): PROBNP in the last 8760 hours. HbA1C: No results for input(s): HGBA1C in the last 72 hours. CBG: No results for input(s): GLUCAP in the last 168 hours. Lipid Profile: No results for input(s): CHOL, HDL, LDLCALC, TRIG, CHOLHDL, LDLDIRECT in the last 72 hours. Thyroid Function Tests: No results for input(s): TSH, T4TOTAL,  FREET4, T3FREE, THYROIDAB in the last 72 hours. Anemia Panel: No results for input(s): VITAMINB12, FOLATE, FERRITIN, TIBC, IRON, RETICCTPCT in the last 72 hours. Sepsis Labs: No results for input(s): PROCALCITON, LATICACIDVEN in the last 168 hours.  No results found for this or any previous visit (from the past 240 hour(s)).       Radiology Studies: No results found.      Scheduled Meds: . apixaban  5 mg Oral BID  . colchicine  0.6 mg Oral Daily  . docusate sodium  100 mg Oral BID  . DULoxetine  60 mg Oral QHS  . fluticasone  2 spray Each Nare Daily  . furosemide  40 mg Oral Daily  . gabapentin  300 mg Oral TID  . hydrocortisone  25 mg Rectal BID  . ipratropium-albuterol  3 mL Nebulization Q6H WA  . isosorbide mononitrate  30 mg Oral Daily  .  magnesium oxide  400 mg Oral Daily  . metoprolol succinate  200 mg Oral Daily  . multivitamin with minerals  1 tablet Oral Q lunch  . pantoprazole  40 mg Oral Daily   Continuous Infusions:   LOS: 0 days    Time spent: 16mins    Edilson Vital, MD [facility] Pager [phone]  If 7PM-7AM, please contact night-coverage [email or web address] Password [email or web address] 02/19/2017, 7:10 PM

## 2017-02-20 ENCOUNTER — Inpatient Hospital Stay (HOSPITAL_COMMUNITY): Payer: Medicare Other

## 2017-02-20 DIAGNOSIS — R0789 Other chest pain: Secondary | ICD-10-CM

## 2017-02-20 DIAGNOSIS — G253 Myoclonus: Secondary | ICD-10-CM | POA: Diagnosis present

## 2017-02-20 DIAGNOSIS — E7220 Disorder of urea cycle metabolism, unspecified: Secondary | ICD-10-CM | POA: Diagnosis present

## 2017-02-20 LAB — BASIC METABOLIC PANEL
ANION GAP: 7 (ref 5–15)
BUN: 32 mg/dL — AB (ref 6–20)
CO2: 29 mmol/L (ref 22–32)
Calcium: 9.1 mg/dL (ref 8.9–10.3)
Chloride: 98 mmol/L — ABNORMAL LOW (ref 101–111)
Creatinine, Ser: 1.39 mg/dL — ABNORMAL HIGH (ref 0.44–1.00)
GFR calc Af Amer: 40 mL/min — ABNORMAL LOW (ref >60)
GFR, EST NON AFRICAN AMERICAN: 34 mL/min — AB (ref >60)
GLUCOSE: 128 mg/dL — AB (ref 65–99)
POTASSIUM: 4.8 mmol/L (ref 3.5–5.1)
Sodium: 134 mmol/L — ABNORMAL LOW (ref 135–145)

## 2017-02-20 LAB — CBC
HEMATOCRIT: 32.3 % — AB (ref 36.0–46.0)
HEMOGLOBIN: 10.3 g/dL — AB (ref 12.0–15.0)
MCH: 26.5 pg (ref 26.0–34.0)
MCHC: 31.9 g/dL (ref 30.0–36.0)
MCV: 83 fL (ref 78.0–100.0)
Platelets: 378 10*3/uL (ref 150–400)
RBC: 3.89 MIL/uL (ref 3.87–5.11)
RDW: 16.8 % — ABNORMAL HIGH (ref 11.5–15.5)
WBC: 6.9 10*3/uL (ref 4.0–10.5)

## 2017-02-20 LAB — VITAMIN B12: Vitamin B-12: 386 pg/mL (ref 180–914)

## 2017-02-20 LAB — PHOSPHORUS: Phosphorus: 3.9 mg/dL (ref 2.5–4.6)

## 2017-02-20 LAB — MAGNESIUM: Magnesium: 1.8 mg/dL (ref 1.7–2.4)

## 2017-02-20 MED ORDER — LACTULOSE 10 GM/15ML PO SOLN
20.0000 g | Freq: Two times a day (BID) | ORAL | Status: DC
  Administered 2017-02-20: 20 g via ORAL
  Filled 2017-02-20 (×2): qty 30

## 2017-02-20 NOTE — Progress Notes (Signed)
PROGRESS NOTE    [name]  XTK:[id number] DOB: January 12, 1933 DOA: 02/17/2017 PCP: Tawni Carnes, PA-C    Brief Narrative:  83 year old female with history of coronary artery disease, chronic systolic CHF atrial fibrillation, presents to the hospital with chest pain concerning for angina.  She was also noted to have acute gouty arthritis in her ankles bilaterally.  This was treated with colchicine and has improved. She ruled out for ACS with negative cardiac markers and nonacute EKG. seen by cardiology no plans for further inpatient ischemic testing.  She also had episodes of rapid atrial fibrillation that were improved after increased dose of Toprol.  The patient also complained of myoclonic jerks in her arms and legs bilaterally.  This is affecting her ability to ambulate.  She was noted to have an elevated ammonia which could be contributing to myoclonic jerks.  She has been started on lactulose and repeat ammonia level is pending in a.m.  If ammonia level improves and myoclonic jerks persist, will need to discuss further with neurology at [facility].  Assessment & Plan:   Principal Problem:   Chest pain Active Problems:   CAD, NATIVE VESSEL - PCI + DES to left circumflex 05/14/13   Chronic diastolic heart failure (HCC)   Major depression (HCC)   PAF (paroxysmal atrial fibrillation) (HCC)   Chronic low back pain   Chronic kidney disease, stage III (moderate) (HCC)   Acute gout   Atrial fibrillation ([place])   1. Chest pain with history of coronary artery disease.  Patient has ruled out for ACS with negative cardiac markers.  Patient was complaining of some chest pain.  EKG was nonspecific.  Cardiology following and does not feel that further ischemic testing is needed at this time.  Will continue with aspirin, beta-blocker.  She is statin intolerant.  She is already anticoagulated with Eliquis.  Imdur has also been added to her regimen.  It is felt that her rapid atrial fibrillation may be  contributing to her symptoms. 2. Acute gout flare.  Patient complained of severe pain in her ankles bilaterally.  Uric acid was noted to be elevated at 9.6.  Radiographs were unremarkable.  She was started on colchicine and has clinically improved. 3. Chronic atrial fibrillation.  Toprol dose has been increased.  Heart rate appears to be better controlled.  Continue to monitor on telemetry.  Anticoagulated with Eliquis.   4. Chronic systolic CHF.  Appears to be compensated.  Ejection fraction 30-35%.  Continue Lasix and beta-blockers. 5. Chronic back pain.  Stable.  Continue home regimen gabapentin, Robaxin and oxycodone 6. Chronic kidney disease stage III.  Creatinine appears to be near baseline.  Continue to monitor. 7. Myoclonic jerks.  Patient noted to have myoclonic jerking in her lower extremity/upper extremities bilaterally.  She reports that this has been progressive over the past several weeks.  She has had difficulty walking with this and is now using a walker.  She is hyperreflexic in her lower extremities bilaterally. MRI of the C spine did not show any changes from imaging done in 2017.  TSH, B12 and PCO2 were noted to be unremarkable.  Ammonia is mildly elevated at 70.  This was discussed with neurology on-call, Dr Aroor who recommended keeping the patient in the hospital and started on lactulose.  As ammonia she was trending down, myoclonic jerking should improve.  If jerking does not improve with improvement of ammonia, will need to discuss again with neurology regarding further workup 8. Hyperammonemia.  Elevated  ammonia of 70.  Etiology is unclear.  Recheck liver enzymes right upper quadrant ultrasound.  No reported history of liver disease..   DVT prophylaxis: Eliquis Code Status: Full code Family Communication: discussed with daughter at the bedside Disposition Plan: Discharge home once workup is complete   Consultants:   Cardiology  Procedures:     Antimicrobials:       Subjective: Feeling better today. Continues to have jerking in legs and arms.  Objective: Vitals:   02/20/17 0813 02/20/17 1100 02/20/17 1419 02/20/17 1445  BP:  (!) 116/58  127/61  Pulse:  72  83  Resp:    19  Temp:    98 F (36.7 C)  TempSrc:      SpO2: 98%  96% 96%  Weight:      Height:        Intake/Output Summary (Last 24 hours) at 02/20/2017 1628 Last data filed at 02/19/2017 2149 Gross per 24 hour  Intake 480 ml  Output 250 ml  Net 230 ml   Filed Weights   02/17/17 2041 02/19/17 0630 02/20/17 0549  Weight: 64.4 kg (142 lb) 66.2 kg (145 lb 14.4 oz) 66.1 kg (145 lb 12.3 oz)    Examination:  General exam: Appears calm and comfortable  Respiratory system: Clear to auscultation. Respiratory effort normal. Cardiovascular system: S1 & S2 heard, irregular. No JVD, murmurs, rubs, gallops or clicks. No pedal edema. Gastrointestinal system: Abdomen is nondistended, soft and nontender. No organomegaly or masses felt. Normal bowel sounds heard. Central nervous system: Alert and oriented. No gross motor/sensory deficits.  Appears to be hyperreflexic on lower extremities bilaterally.  Myoclonic jerks noted in legs and arms bilaterally. Extremities: Symmetric 5 x 5 power.  No tenderness in ankles bilaterally, she has increased range of motion Skin: Swelling and redness in ankles is better today. Psychiatry: Judgement and insight appear normal. Mood & affect appropriate.     Data Reviewed: I have personally reviewed following labs and imaging studies  CBC: Recent Labs  Lab 02/17/17 1549 02/18/17 0256 02/20/17 0548  WBC 12.8* 8.3 6.9  NEUTROABS 9.2*  --   --   HGB 12.2 11.0* 10.3*  HCT 38.0 34.4* 32.3*  MCV 82.3 82.7 83.0  PLT 441* 372 403   Basic Metabolic Panel: Recent Labs  Lab 02/17/17 1549 02/18/17 0256 02/19/17 0514 02/20/17 0548  NA 135 136 134* 134*  K 3.5 3.7 5.1 4.8  CL 96* 99* 98* 98*  CO2 27 30 26 29   GLUCOSE 137* 97 124* 128*  BUN 23* 23*  28* 32*  CREATININE 1.29* 1.25* 1.45* 1.39*  CALCIUM 9.6 9.1 9.0 9.1  MG  --   --   --  1.8  PHOS  --   --   --  3.9   GFR: Estimated Creatinine Clearance: 27.1 mL/min (A) (by C-G formula based on SCr of 1.39 mg/dL (H)). Liver Function Tests: Recent Labs  Lab 02/17/17 1552  AST 44*  ALT 30  ALKPHOS 285*  BILITOT 0.9  PROT 8.7*  ALBUMIN 3.8   No results for input(s): LIPASE, AMYLASE in the last 168 hours. Recent Labs  Lab 02/19/17 1936  AMMONIA 70*   Coagulation Profile: No results for input(s): INR, PROTIME in the last 168 hours. Cardiac Enzymes: Recent Labs  Lab 02/17/17 1549 02/17/17 2157 02/18/17 0255 02/18/17 0922  TROPONINI <0.03 0.03* 0.03* <0.03   BNP (last 3 results) No results for input(s): PROBNP in the last 8760 hours. HbA1C: No results for input(s):  HGBA1C in the last 72 hours. CBG: No results for input(s): GLUCAP in the last 168 hours. Lipid Profile: No results for input(s): CHOL, HDL, LDLCALC, TRIG, CHOLHDL, LDLDIRECT in the last 72 hours. Thyroid Function Tests: Recent Labs    02/19/17 1936  TSH 5.881*   Anemia Panel: Recent Labs    02/19/17 1936  VITAMINB12 386   Sepsis Labs: No results for input(s): PROCALCITON, LATICACIDVEN in the last 168 hours.  No results found for this or any previous visit (from the past 240 hour(s)).       Radiology Studies: Mr Cervical Spine Wo Contrast  Result Date: 02/20/2017 CLINICAL DATA:  Neck pain and weakness for 2 days. EXAM: MRI CERVICAL SPINE WITHOUT CONTRAST TECHNIQUE: Multiplanar, multisequence MR imaging of the cervical spine was performed. No intravenous contrast was administered. COMPARISON:  CT myelogram dated 08/25/2015 FINDINGS: Alignment: 2 mm anterolisthesis at C4-5.  Otherwise normal. Vertebrae: No fracture, evidence of discitis, or significant bone lesion. Cord: Normal signal and morphology. Posterior Fossa, vertebral arteries, paraspinal tissues: Negative. Disc levels: C2-3: Normal  disc. Minimal degenerative changes of the left facet joint. Widely patent neural foramina. C3-4: Tiny central disc bulge with no neural impingement. Widely patent neural foramina. Normal facet joints. C4-5: Moderately severe left facet arthritis with moderately severe left foraminal stenosis. Normal right facet joint. 2 mm anterolisthesis. No disc bulging or protrusion. C5-6: Disc space narrowing. Small uncovertebral joint spurs. No foraminal stenosis. No disc bulging or protrusion. Facet joints are normal. C6-7: Tiny central disc bulge with no neural impingement. Widely patent neural foramina. Normal facet joints. C7-T1:  Normal. IMPRESSION: 1. Moderately severe left facet arthritis with moderately severe left foraminal stenosis at C4-5. This appears essentially unchanged since the prior CT myelogram 08/25/2015. 2. No other significant abnormalities or significant change. Electronically Signed   By: Lorriane Shire M.D.   On: 02/20/2017 12:05        Scheduled Meds: . apixaban  5 mg Oral BID  . colchicine  0.6 mg Oral Daily  . docusate sodium  100 mg Oral BID  . DULoxetine  60 mg Oral QHS  . fluticasone  2 spray Each Nare Daily  . furosemide  40 mg Oral Daily  . gabapentin  300 mg Oral TID  . hydrocortisone  25 mg Rectal BID  . ipratropium-albuterol  3 mL Nebulization Q6H WA  . isosorbide mononitrate  30 mg Oral Daily  . lactulose  20 g Oral BID  . magnesium oxide  400 mg Oral Daily  . metoprolol succinate  200 mg Oral Daily  . multivitamin with minerals  1 tablet Oral Q lunch  . pantoprazole  40 mg Oral Daily   Continuous Infusions:   LOS: 1 day    Time spent: 68mins    Jerae Izard, MD [facility] Pager [phone]  If 7PM-7AM, please contact night-coverage [email or web address] Password [facility] 02/20/2017, 4:28 PM

## 2017-02-20 NOTE — Progress Notes (Signed)
Progress Note  Patient Name: [name] Date of Encounter: 02/20/2017  Primary Cardiologist: Kate Sable, MD  Subjective   Feeling much better, no further discomfort in in her chest.  Continues to complain of lower extremity pain at the feet and ankles.  Is anxious to return home.  Inpatient Medications    Scheduled Meds: . apixaban  5 mg Oral BID  . colchicine  0.6 mg Oral Daily  . docusate sodium  100 mg Oral BID  . DULoxetine  60 mg Oral QHS  . fluticasone  2 spray Each Nare Daily  . furosemide  40 mg Oral Daily  . gabapentin  300 mg Oral TID  . hydrocortisone  25 mg Rectal BID  . ipratropium-albuterol  3 mL Nebulization Q6H WA  . isosorbide mononitrate  30 mg Oral Daily  . magnesium oxide  400 mg Oral Daily  . metoprolol succinate  200 mg Oral Daily  . multivitamin with minerals  1 tablet Oral Q lunch  . pantoprazole  40 mg Oral Daily    PRN Meds: acetaminophen, ALPRAZolam, bisacodyl, methocarbamol, nitroGLYCERIN, ondansetron (ZOFRAN) IV, oxyCODONE   Vital Signs    Vitals:   02/19/17 2115 02/19/17 2134 02/20/17 0549 02/20/17 0813  BP:  129/87 128/81   Pulse:  (!) 113 (!) 101   Resp:  18 18   Temp:  98 F (36.7 C) 98.1 F (36.7 C)   TempSrc:  Oral Oral   SpO2: 99% 100% 100% 98%  Weight:   145 lb 12.3 oz (66.1 kg)   Height:        Intake/Output Summary (Last 24 hours) at 02/20/2017 0906 Last data filed at 02/19/2017 2149 Gross per 24 hour  Intake 1320 ml  Output 1150 ml  Net 170 ml   Filed Weights   02/17/17 2041 02/19/17 0630 02/20/17 0549  Weight: 142 lb (64.4 kg) 145 lb 14.4 oz (66.2 kg) 145 lb 12.3 oz (66.1 kg)    Telemetry    Atrial fibrillation with rates 90-110 bpm. Occasional PAC's  Personally Reviewed.  Physical Exam   GEN: Elderly woman. No acute distress.   Neck: No JVD Cardiac: IRRR, no murmurs, rubs, or gallops. Respiratory: Clear to auscultation bilaterally. GI: Soft, nontender, non-distended. MS: No edema; No  deformity.  Erythema noted bilateral great toe and left ankle.  Pain with range of motion.  Labs    Chemistry Recent Labs  Lab 02/17/17 1552 02/18/17 0256 02/19/17 0514 02/20/17 0548  NA  --  136 134* 134*  K  --  3.7 5.1 4.8  CL  --  99* 98* 98*  CO2  --  30 26 29   GLUCOSE  --  97 124* 128*  BUN  --  23* 28* 32*  CREATININE  --  1.25* 1.45* 1.39*  CALCIUM  --  9.1 9.0 9.1  PROT 8.7*  --   --   --   ALBUMIN 3.8  --   --   --   AST 44*  --   --   --   ALT 30  --   --   --   ALKPHOS 285*  --   --   --   BILITOT 0.9  --   --   --   GFRNONAA  --  39* 33* 34*  GFRAA  --  45* 38* 40*  ANIONGAP  --  7 10 7      Hematology Recent Labs  Lab 02/17/17 1549 02/18/17 0256 02/20/17 6568  WBC 12.8* 8.3 6.9  RBC 4.62 4.16 3.89  HGB 12.2 11.0* 10.3*  HCT 38.0 34.4* 32.3*  MCV 82.3 82.7 83.0  MCH 26.4 26.4 26.5  MCHC 32.1 32.0 31.9  RDW 17.3* 17.0* 16.8*  PLT 441* 372 378    Cardiac Enzymes Recent Labs  Lab 02/17/17 1549 02/17/17 2157 02/18/17 0255 02/18/17 0922  TROPONINI <0.03 0.03* 0.03* <0.03   No results for input(s): TROPIPOC in the last 168 hours.   BNP Recent Labs  Lab 02/17/17 1549  BNP 589.0*      Radiology    No results found.  Cardiac Studies   None this admission.  Echocardiogram 04/17/2016:  Left ventricle: The cavity size was normal. There was mild   concentric hypertrophy. Systolic function was moderately to   severely reduced. The estimated ejection fraction was in the   range of 30% to 35%. Wall motion was normal; there were no   regional wall motion abnormalities. - Aortic valve: Mildly calcified annulus. Trileaflet; normal   thickness, mildly calcified leaflets. There was mild   regurgitation. - Mitral valve: There was moderate regurgitation. - Left atrium: The atrium was moderately dilated. - Pulmonary arteries: Systolic pressure was mildly increased. PA   peak pressure: 38 mm Hg (S). - Pericardium, extracardiac: A small  pericardial effusion was   identified.  Patient Profile     83 y.o. female hx of diastolic heart failure, atrial fibrillation on Eliquis,, coronary artery disease treated medically, chronic kidney disease stage III, chronic low back pain, and depression.  Assessment & Plan    1.  Chest pain: Completely resolved.  She has been up walking in her room and to the bathroom without recurrent complaints.  She has been ruled out for ACS.  She continues on isosorbide mononitrate 30 mg daily.  She has a initially scheduled appointment with Dr. Bronson Ing on March 26, 2017.  She will need to keep that appointment.   2. CAD: Known history of disease of the circumflex artery with ostial 20% LAD and ostial right coronary artery stenosis of 80%, treated medically.  She is currently not receiving ACE or ARB in the setting of chronic kidney disease.  Creatinine in this a.m. 0.17.  3.  Systolic dysfunction: Reduced EF per echocardiogram January 2018.  Has New York Heart Association class II-III symptoms at baseline.  Continue medical management.  Consider repeating echocardiogram as outpatient for reevaluation of LV function.  4.  Atrial fibrillation: Some periods of tachycardia but trend is better.  Currently heart rate in the 80s.  Metoprolol was increased to 200 mg daily for better heart rate control yesterday.  Continue Eliquis.   5.  Right upper quadrant pain with associated nausea and vomiting: This is also resolving.  Any further nausea or vomiting, with no complaints of abdominal pain this a.m.  Continues on PPI.   6.  Acute gout: Continues on colchicine for symptomatic relief.  7.  Chronic kidney disease Stage III: Creatinine has improved some this morning, with a level of 1.39.  Signed, Phill Myron. West Pugh, ANP, AACC   02/20/2017, 9:06 AM     Attending note:  Patient seen and examined.  Reviewed interval hospital course and discussed the case with Ms. West Pugh.  Ms. Mcclatchey states  that she has had no further chest pain, has done some ambulation in her room.  Heart rate control of atrial fibrillation has improved following increase in metoprolol dosing, still has some periods of RVR with activity.  Gout pain is also improving.  On examination lungs are clear with decreased breath sounds but no wheezing.  Cardiac exam reveals irregularly irregular rhythm without gallop.  Lab work shows creatinine down to 1.39, potassium 4.8, hemoglobin 10.3, TSH 5.88.  Patient would like to go home.  From a cardiac perspective would recommend continuing present regimen including Eliquis, Imdur, Lasix, and Toprol-XL at 200 mg daily.  She should keep scheduled follow-up with Dr. Bronson Ing as noted above.  We will sign off.  Satira Sark, M.D., F.A.C.C.

## 2017-02-21 ENCOUNTER — Inpatient Hospital Stay (HOSPITAL_COMMUNITY): Payer: Medicare Other

## 2017-02-21 DIAGNOSIS — G253 Myoclonus: Secondary | ICD-10-CM

## 2017-02-21 DIAGNOSIS — G8929 Other chronic pain: Secondary | ICD-10-CM

## 2017-02-21 DIAGNOSIS — I259 Chronic ischemic heart disease, unspecified: Secondary | ICD-10-CM

## 2017-02-21 DIAGNOSIS — M5440 Lumbago with sciatica, unspecified side: Secondary | ICD-10-CM

## 2017-02-21 DIAGNOSIS — I10 Essential (primary) hypertension: Secondary | ICD-10-CM

## 2017-02-21 DIAGNOSIS — I480 Paroxysmal atrial fibrillation: Secondary | ICD-10-CM

## 2017-02-21 DIAGNOSIS — N183 Chronic kidney disease, stage 3 (moderate): Secondary | ICD-10-CM

## 2017-02-21 DIAGNOSIS — I2510 Atherosclerotic heart disease of native coronary artery without angina pectoris: Secondary | ICD-10-CM

## 2017-02-21 DIAGNOSIS — M109 Gout, unspecified: Secondary | ICD-10-CM

## 2017-02-21 DIAGNOSIS — F329 Major depressive disorder, single episode, unspecified: Secondary | ICD-10-CM

## 2017-02-21 DIAGNOSIS — E7220 Disorder of urea cycle metabolism, unspecified: Secondary | ICD-10-CM

## 2017-02-21 DIAGNOSIS — I4891 Unspecified atrial fibrillation: Secondary | ICD-10-CM

## 2017-02-21 LAB — COMPREHENSIVE METABOLIC PANEL
ALBUMIN: 3.2 g/dL — AB (ref 3.5–5.0)
ALK PHOS: 398 U/L — AB (ref 38–126)
ALT: 54 U/L (ref 14–54)
ANION GAP: 10 (ref 5–15)
AST: 46 U/L — ABNORMAL HIGH (ref 15–41)
BILIRUBIN TOTAL: 0.6 mg/dL (ref 0.3–1.2)
BUN: 24 mg/dL — ABNORMAL HIGH (ref 6–20)
CALCIUM: 9.4 mg/dL (ref 8.9–10.3)
CO2: 29 mmol/L (ref 22–32)
Chloride: 95 mmol/L — ABNORMAL LOW (ref 101–111)
Creatinine, Ser: 1.28 mg/dL — ABNORMAL HIGH (ref 0.44–1.00)
GFR calc non Af Amer: 38 mL/min — ABNORMAL LOW (ref >60)
GFR, EST AFRICAN AMERICAN: 44 mL/min — AB (ref >60)
GLUCOSE: 110 mg/dL — AB (ref 65–99)
POTASSIUM: 4.1 mmol/L (ref 3.5–5.1)
SODIUM: 134 mmol/L — AB (ref 135–145)
TOTAL PROTEIN: 7.3 g/dL (ref 6.5–8.1)

## 2017-02-21 LAB — CBC
HEMATOCRIT: 33.2 % — AB (ref 36.0–46.0)
HEMOGLOBIN: 10.5 g/dL — AB (ref 12.0–15.0)
MCH: 26.4 pg (ref 26.0–34.0)
MCHC: 31.6 g/dL (ref 30.0–36.0)
MCV: 83.4 fL (ref 78.0–100.0)
Platelets: 421 10*3/uL — ABNORMAL HIGH (ref 150–400)
RBC: 3.98 MIL/uL (ref 3.87–5.11)
RDW: 16.9 % — ABNORMAL HIGH (ref 11.5–15.5)
WBC: 7 10*3/uL (ref 4.0–10.5)

## 2017-02-21 LAB — AMMONIA: Ammonia: 28 umol/L (ref 9–35)

## 2017-02-21 MED ORDER — ALLOPURINOL 100 MG PO TABS: 100.0000 mg | ORAL_TABLET | Freq: Every day | ORAL | 0 refills | Status: DC

## 2017-02-21 MED ORDER — COLCHICINE 0.6 MG PO TABS: 0.6000 mg | ORAL_TABLET | Freq: Every day | ORAL | 0 refills | Status: DC

## 2017-02-21 MED ORDER — ISOSORBIDE MONONITRATE ER 30 MG PO TB24: 30.0000 mg | ORAL_TABLET | Freq: Every day | ORAL | 0 refills | Status: DC

## 2017-02-21 MED ORDER — METOPROLOL SUCCINATE ER 200 MG PO TB24: 200.0000 mg | ORAL_TABLET | Freq: Every day | ORAL | 0 refills | Status: DC

## 2017-02-21 NOTE — Care Management Important Message (Signed)
Important Message  Patient Details  Name: RAYOLA EVERHART MRN: [id number] Date of Birth: [birth year]   Medicare Important Message Given:  Yes    Pamula Luther, Chauncey Reading, RN 02/21/2017, 10:34 AM

## 2017-02-21 NOTE — Discharge Summary (Signed)
Physician Discharge Summary  [name] HEN:[id number] DOB: 05/20/1932 DOA: 02/17/2017  PCP: Tawni Carnes, PA-C Cardiologist: Dr. Bronson Ing  Admit date: 02/17/2017 Discharge date: 02/21/2017  Admitted From: HOME  Disposition:  HOME   Recommendations for Outpatient Follow-up:  1. Follow up with PCP in 1 weeks 2. Follow up with cardiology as scheduled in December 3. Please refer to outpatient neurologist for movement disorders evaluation 4. Please obtain CMP/CBC/Ammonia level in one week  Discharge Condition: STABLE   CODE STATUS: Full   Brief Hospitalization Summary: Please see all hospital notes, images, labs for full details of the hospitalization.  From HPI: [name] is a 83 y.o. female with medical history significant for chronic systolic CHF, atrial fibrillation on Eliquis, depression with anxiety, and COPD, now presenting to the emergency department for evaluation of discomfort in the lower chest that is occurred intermittently over the past several days.  Patient reports a dull, nonradiating, moderate in severity discomfort at the lower chest centrally.  This has been been occurring more frequently with exertion and has been associated with some mild dyspnea but no cough, fevers, or chills.  She also reports some swelling and severe pain in her foot and ankle.  Denies any injury or inciting event that she can recall for her ankle pain.  ED Course: Upon arrival to the ED, patient is found to be afebrile, saturating low 90s on room air, heart rate in the low 100s, and stable.  EKG features atrial fibrillation with rate 101.  Chest x-ray features a persistent focal consolidation in the right upper lobe and was followed up with CT of the chest demonstrates interval improvement of the parenchymal lung disease in the right upper lobe, likely improving infectious or inflammatory process.  Radiographs of the right foot are negative.  Chemistry panel reveals a serum creatinine 1.29,  consistent with her baseline.  CBC is notable for a leukocytosis to 12,800 and a mild thrombocytosis with platelets 141,000.  Troponin is undetectable, BNP is elevated to 589, and uric acid is elevated to a value of 9.6.  Patient was treated with Dilaudid and Zofran in the ED.  She remained hemodynamically stable respiratory distress.  She will be observed on the telemetry unit for ongoing evaluation and management of intermittent chest discomfort in patient with known CAD.  Brief Narrative:  83 year old female with history of coronary artery disease, chronic systolic CHF atrial fibrillation, presents to the hospital with chest pain concerning for angina.  She was also noted to have acute gouty arthritis in her ankles bilaterally.  This was treated with colchicine and has improved. She ruled out for ACS with negative cardiac markers and nonacute EKG. seen by cardiology no plans for further inpatient ischemic testing.  She also had episodes of rapid atrial fibrillation that were improved after increased dose of Toprol.  The patient also complained of myoclonic jerks in her arms and legs bilaterally.  This is affecting her ability to ambulate.  She was noted to have an elevated ammonia which could be contributing to myoclonic jerks.  She has been started on lactulose and repeat ammonia level is pending in a.m.  If ammonia level improves and myoclonic jerks persist, will need to discuss further with neurology at [facility].  Assessment & Plan:   Principal Problem:   Chest pain Active Problems:   CAD, NATIVE VESSEL - PCI + DES to left circumflex 05/14/13   Chronic diastolic heart failure (HCC)   Major depression (HCC)   PAF (paroxysmal  atrial fibrillation) (HCC)   Chronic low back pain   Chronic kidney disease, stage III (moderate) (HCC)   Acute gout   Atrial fibrillation ([place])   1. Chest pain with history of coronary artery disease.  Patient has ruled out for ACS with negative cardiac markers.  Patient  was complaining of some chest pain.  EKG was nonspecific.  Cardiology following and does not feel that further ischemic testing is needed at this time.  Will continue with aspirin, beta-blocker.  She is statin intolerant.  She is already anticoagulated with Eliquis.  Imdur has also been added to her regimen.  It is felt that her rapid atrial fibrillation may be contributing to her symptoms. 2. Acute gout flare.  Patient complained of severe pain in her ankles bilaterally.  Uric acid was noted to be elevated at 9.6.  Radiographs were unremarkable.  She was started on colchicine and has clinically improved. Also started on allopurinol at discharge to lower her uric acid levels.  Started at 100 mg daily and recommend adjusting dose outpatient with PCP.   3. Chronic atrial fibrillation.  Toprol dose has been increased to 200 mg daily.  Heart rate appears to be better controlled.  Continue to monitor on telemetry.  Anticoagulated with Eliquis.   4. Chronic systolic CHF.  Appears to be compensated.  Ejection fraction 30-35%.  Continue Lasix and beta-blockers. 5. Chronic back pain.  Stable.  Continue home regimen gabapentin, Robaxin and oxycodone 6. Chronic kidney disease stage III.  Creatinine appears to be near baseline.  Continue to monitor. 7. Myoclonic jerks.  Patient noted to have myoclonic jerking in her lower extremity/upper extremities bilaterally.  She reports that this has been progressive over the past several weeks.  She has had difficulty walking with this and is now using a walker.  She is hyperreflexic in her lower extremities bilaterally. MRI of the C spine did not show any changes from imaging done in 2017.  TSH, B12 and PCO2 were noted to be unremarkable.  Ammonia is mildly elevated at 70.  This was discussed with neurology on-call, Dr Aroor who recommended starting on lactulose.  He ammonia level came down to normal and the movements stopped. Outpatient referral to neurology recommended.   8. Hyperammonemia.  Elevated ammonia of 70.  Etiology is unclear.  Recheck liver enzymes right upper quadrant ultrasound.  No reported history of liver disease.  Abdominal US no liver disease seen. Did not discharge on lactulose, but recommend repeating ammonia level outpatient with PCP in 1 week and if elevated again, consider restarting lactulose.    DVT prophylaxis: Eliquis Code Status: Full code Family Communication: discussed with daughter at the bedside Disposition Plan: Discharge home once workup is complete   Consultants:   Cardiology  Discharge Diagnoses:  Principal Problem:   Chest pain Active Problems:   CAD, NATIVE VESSEL - PCI + DES to left circumflex 05/14/13   Major depression (HCC)   PAF (paroxysmal atrial fibrillation) (HCC)   Chronic low back pain   Chronic kidney disease, stage III (moderate) (HCC)   Essential hypertension   Acute gout   Chronic systolic CHF (congestive heart failure) (HCC)   Atrial fibrillation (HCC)   Hyperammonemia (HCC)   Myoclonic jerking  Discharge Instructions: Discharge Instructions    Call MD for:  difficulty breathing, headache or visual disturbances   Complete by:  As directed    Call MD for:  persistant dizziness or light-headedness   Complete by:  As directed  Call MD for:  persistant nausea and vomiting   Complete by:  As directed    Call MD for:  redness, tenderness, or signs of infection (pain, swelling, redness, odor or green/yellow discharge around incision site)   Complete by:  As directed    Call MD for:  severe uncontrolled pain   Complete by:  As directed    Call MD for:  temperature >100.4   Complete by:  As directed    Diet - low sodium heart healthy   Complete by:  As directed    Increase activity slowly   Complete by:  As directed      Allergies as of 02/21/2017      Reactions   Cephalosporins Diarrhea, Nausea Only   Lightheaded   Levaquin [levofloxacin In D5w] Swelling   Macrodantin  [nitrofurantoin Macrocrystal] Swelling   Phenothiazines Anaphylaxis, Hives   Polysorbate Anaphylaxis   Prednisone Shortness Of Breath   Buspirone Itching   Cardura [doxazosin Mesylate] Itching   Codeine Itching   Acyclovir And Related Itching   Redness of skin   Prochlorperazine Other (See Comments)   "Upset stomach"   Ranexa [ranolazine]    Severe drop in BP   Atorvastatin Hives   Cramping; tolerates Crestor ok   Ofloxacin Rash   Other Itching, Rash   "WOOL"= make skin look like it has been burned   Penicillins Other (See Comments)   Causes redness all over. Has patient had a PCN reaction causing immediate rash, facial/tongue/throat swelling, SOB or lightheadedness with hypotension: No Has patient had a PCN reaction causing severe rash involving mucus membranes or skin necrosis: No Has patient had a PCN reaction that required hospitalization No Has patient had a PCN reaction occurring within the last 10 years: No If all of the above answers are "NO", then may proceed with Cephalosporin use.   Pimozide Hives, Itching      Medication List    TAKE these medications   acetaminophen 500 MG tablet Commonly known as:  TYLENOL Take 500 mg by mouth daily as needed for headache.   allopurinol 100 MG tablet Commonly known as:  ZYLOPRIM Take 1 tablet (100 mg total) daily by mouth.   ALPRAZolam 0.25 MG tablet Commonly known as:  XANAX Take 0.25 mg by mouth 2 (two) times daily as needed.   colchicine 0.6 MG tablet Take 1 tablet (0.6 mg total) daily by mouth.   DULoxetine 60 MG capsule Commonly known as:  CYMBALTA Take 60 mg by mouth at bedtime.   ELIQUIS 5 MG Tabs tablet Generic drug:  apixaban TAKE ONE TABLET BY MOUTH TWICE DAILY.   fluticasone 50 MCG/ACT nasal spray Commonly known as:  FLONASE Place 2 sprays into both nostrils 2 (two) times daily as needed for allergies.   furosemide 40 MG tablet Commonly known as:  LASIX Take 40 mg by mouth daily.   furosemide 20  MG tablet Commonly known as:  LASIX Take 20 mg by mouth daily as needed for fluid. Take in addition to the 40mg  if needed for swelling   gabapentin 300 MG capsule Commonly known as:  NEURONTIN Take 1 capsule (300 mg total) by mouth 3 (three) times daily.   hydrocortisone 25 MG suppository Commonly known as:  ANUSOL-HC Place 1 suppository (25 mg total) rectally every 12 (twelve) hours.   ipratropium-albuterol 0.5-2.5 (3) MG/3ML Soln Commonly known as:  DUONEB Take 3 mLs by nebulization every 6 (six) hours.   isosorbide mononitrate 30 MG 24 hr tablet  Commonly known as:  IMDUR Take 1 tablet (30 mg total) daily by mouth.   Magnesium 400 MG Tabs Take 400 mg by mouth daily.   methocarbamol 500 MG tablet Commonly known as:  ROBAXIN Take 500 mg by mouth every 6 (six) hours as needed for muscle spasms.   metoprolol 200 MG 24 hr tablet Commonly known as:  TOPROL-XL Take 1 tablet (200 mg total) daily by mouth. What changed:    medication strength  how much to take  how to take this  when to take this  additional instructions   multivitamin with minerals Tabs tablet Take 1 tablet by mouth daily with lunch. Centrum   nitroGLYCERIN 0.4 MG SL tablet Commonly known as:  NITROSTAT Place 0.4 mg under the tongue every 5 (five) minutes as needed for chest pain. Reported on 08/04/2015   oxyCODONE 5 MG immediate release tablet Commonly known as:  ROXICODONE Take one tablet by mouth twice daily as needed for pain   pantoprazole 40 MG tablet Commonly known as:  PROTONIX Take 40 mg by mouth daily.   potassium chloride 10 MEQ tablet Commonly known as:  K-DUR,KLOR-CON TAKE TWO (2) TABLETS BY MOUTH DAILY.      Follow-up Information    Tawni Carnes, [place]. Schedule an appointment as soon as possible for a visit in 1 week(s).   Specialty:  Physician Assistant Contact information: [address] [place] [address]        Herminio Commons, MD Follow up on 03/26/2017.    Specialty:  Cardiology Why:  as scheduled Contact information: [place] [address] [phone]          Allergies  Allergen Reactions  . Cephalosporins Diarrhea and Nausea Only    Lightheaded  . Levaquin [Levofloxacin In D5w] Swelling  . Macrodantin [Nitrofurantoin Macrocrystal] Swelling  . Phenothiazines Anaphylaxis and Hives  . Polysorbate Anaphylaxis  . Prednisone Shortness Of Breath  . Buspirone Itching  . Cardura [Doxazosin Mesylate] Itching  . Codeine Itching  . Acyclovir And Related Itching    Redness of skin  . Prochlorperazine Other (See Comments)    "Upset stomach"  . Ranexa [Ranolazine]     Severe drop in BP  . Atorvastatin Hives    Cramping; tolerates Crestor ok  . Ofloxacin Rash  . Other Itching and Rash    "WOOL"= make skin look like it has been burned  . Penicillins Other (See Comments)    Causes redness all over. Has patient had a PCN reaction causing immediate rash, facial/tongue/throat swelling, SOB or lightheadedness with hypotension: No Has patient had a PCN reaction causing severe rash involving mucus membranes or skin necrosis: No Has patient had a PCN reaction that required hospitalization No Has patient had a PCN reaction occurring within the last 10 years: No If all of the above answers are "NO", then may proceed with Cephalosporin use.   . Pimozide Hives and Itching   Current Discharge Medication List    START taking these medications   Details  allopurinol (ZYLOPRIM) 100 MG tablet Take 1 tablet (100 mg total) daily by mouth. Qty: 30 tablet, Refills: 0    colchicine 0.6 MG tablet Take 1 tablet (0.6 mg total) daily by mouth. Qty: 30 tablet, Refills: 0    isosorbide mononitrate (IMDUR) 30 MG 24 hr tablet Take 1 tablet (30 mg total) daily by mouth. Qty: 30 tablet, Refills: 0      CONTINUE these medications which have CHANGED  Details  metoprolol succinate (TOPROL-XL) 200 MG 24 hr tablet Take 1 tablet (200 mg total)  daily by mouth. Qty: 30 tablet, Refills: 0      CONTINUE these medications which have NOT CHANGED   Details  acetaminophen (TYLENOL) 500 MG tablet Take 500 mg by mouth daily as needed for headache.     ALPRAZolam (XANAX) 0.25 MG tablet Take 0.25 mg by mouth 2 (two) times daily as needed. Refills: 0    DULoxetine (CYMBALTA) 60 MG capsule Take 60 mg by mouth at bedtime.     ELIQUIS 5 MG TABS tablet TAKE ONE TABLET BY MOUTH TWICE DAILY. Qty: 180 tablet, Refills: 3    fluticasone (FLONASE) 50 MCG/ACT nasal spray Place 2 sprays into both nostrils 2 (two) times daily as needed for allergies.     !! furosemide (LASIX) 20 MG tablet Take 20 mg by mouth daily as needed for fluid. Take in addition to the 40mg  if needed for swelling    !! furosemide (LASIX) 40 MG tablet Take 40 mg by mouth daily.    gabapentin (NEURONTIN) 300 MG capsule Take 1 capsule (300 mg total) by mouth 3 (three) times daily. Qty: 90 capsule, Refills: 2    hydrocortisone (ANUSOL-HC) 25 MG suppository Place 1 suppository (25 mg total) rectally every 12 (twelve) hours. Qty: 12 suppository, Refills: 1   Associated Diagnoses: Hemorrhoids, unspecified hemorrhoid type    ipratropium-albuterol (DUONEB) 0.5-2.5 (3) MG/3ML SOLN Take 3 mLs by nebulization every 6 (six) hours. Qty: 360 mL, Refills: 0    Magnesium 400 MG TABS Take 400 mg by mouth daily. Qty: 90 tablet, Refills: 3    methocarbamol (ROBAXIN) 500 MG tablet Take 500 mg by mouth every 6 (six) hours as needed for muscle spasms.     Multiple Vitamin (MULTIVITAMIN WITH MINERALS) TABS tablet Take 1 tablet by mouth daily with lunch. Centrum    nitroGLYCERIN (NITROSTAT) 0.4 MG SL tablet Place 0.4 mg under the tongue every 5 (five) minutes as needed for chest pain. Reported on 08/04/2015    oxyCODONE (ROXICODONE) 5 MG immediate release tablet Take one tablet by mouth twice daily as needed for pain Qty: 60 tablet, Refills: 0    pantoprazole (PROTONIX) 40 MG tablet Take 40  mg by mouth daily.     potassium chloride (K-DUR,KLOR-CON) 10 MEQ tablet TAKE TWO (2) TABLETS BY MOUTH DAILY. Qty: 180 tablet, Refills: 3     !! - Potential duplicate medications found. Please discuss with provider.    STOP taking these medications     bisacodyl (DULCOLAX) 5 MG EC tablet         Procedures/Studies: Ct Chest W Contrast  Result Date: 02/17/2017 CLINICAL DATA:  Initial evaluation for acute chest pain, shortness of breath. EXAM: CT CHEST WITH CONTRAST TECHNIQUE: Multidetector CT imaging of the chest was performed during intravenous contrast administration. CONTRAST:  82mL ISOVUE-300 IOPAMIDOL (ISOVUE-300) INJECTION 61% COMPARISON:  Prior radiograph from earlier the same day as well as prior CT from 09/30/2016. FINDINGS: Cardiovascular: Intrathoracic aorta of normal caliber without aneurysm or acute abnormality. Moderate atherosclerosis. Visualized great vessels within normal limits. Cardiomegaly. Diffuse 3 vessel coronary artery calcifications. No pericardial effusion. Limited evaluation of the pulmonary arterial tree grossly unremarkable. Mediastinum/Nodes: Visualized thyroid normal. 12 mm AP window node is decreased in size from previous, previously 16 mm). Mildly prominent subcarinal node also decreased measuring 13 mm, previously 17 mm). Mild soft tissue prominence within the right hilar region similar to slightly decreased in prominence. 7 mm  left hilar node not significantly changed. No new adenopathy. No enlarged axillary nodes. Esophagus normal. Lungs/Pleura: Tracheobronchial tree intact and patent. Mild bibasilar interlobular septal thickening is similar to previous exam. Previously seen peripheral parenchymal opacity within the posterior oral lateral right lung persists but is improved relative to prior CT (series 4, image 46), now all less dense in appearance. Additional patchy reticulonodular densities within the posterior right lower lobe are relatively similar. Additional  reticulonodular opacity at the posterior left lower lobe slightly improved (series 4, image 75). No new focal infiltrates. 5 mm left upper lobe pulmonary nodule is relatively similar to previous, likely infectious or inflammatory (series 4, image 61). No pulmonary edema. No pleural effusion. No pneumothorax. Upper Abdomen: Visualized upper abdomen demonstrates no acute abnormality. Patient status post cholecystectomy. 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 Musculoskeletal: No acute osseus abnormality. Remote left-sided rib fractures noted. No worrisome lytic or blastic osseous lesions. IMPRESSION: 1. Interval improvement and peripheral parenchymal lung disease involving the right upper lobe as compared to prior CT from 09/30/2016. Finding favored to reflect improving infectious or inflammatory process, with a neoplastic process felt to be unlikely given the interval improvement. Additional scattered lung opacities are also stable to slightly improved. Follow-up to resolution recommended. 2. Persistent but improved mediastinal and hilar adenopathy. Continued attention at follow-up recommended. 3. No other new cardiopulmonary abnormality. Electronically Signed   By: Jeannine Boga M.D.   On: 02/17/2017 18:59   Mr Cervical Spine Wo Contrast  Result Date: 02/20/2017 CLINICAL DATA:  Neck pain and weakness for 2 days. EXAM: MRI CERVICAL SPINE WITHOUT CONTRAST TECHNIQUE: Multiplanar, multisequence MR imaging of the cervical spine was performed. No intravenous contrast was administered. COMPARISON:  CT myelogram dated 08/25/2015 FINDINGS: Alignment: 2 mm anterolisthesis at C4-5.  Otherwise normal. Vertebrae: No fracture, evidence of discitis, or significant bone lesion. Cord: Normal signal and morphology. Posterior Fossa, vertebral arteries, paraspinal tissues: Negative. Disc levels: C2-3: Normal disc. Minimal degenerative changes of the left facet joint. Widely patent neural foramina. C3-4:  Tiny central disc bulge with no neural impingement. Widely patent neural foramina. Normal facet joints. C4-5: Moderately severe left facet arthritis with moderately severe left foraminal stenosis. Normal right facet joint. 2 mm anterolisthesis. No disc bulging or protrusion. C5-6: Disc space narrowing. Small uncovertebral joint spurs. No foraminal stenosis. No disc bulging or protrusion. Facet joints are normal. C6-7: Tiny central disc bulge with no neural impingement. Widely patent neural foramina. Normal facet joints. C7-T1:  Normal. IMPRESSION: 1. Moderately severe left facet arthritis with moderately severe left foraminal stenosis at C4-5. This appears essentially unchanged since the prior CT myelogram 08/25/2015. 2. No other significant abnormalities or significant change. Electronically Signed   By: Lorriane Shire M.D.   On: 02/20/2017 12:05   Dg Chest Portable 1 View  Result Date: 02/17/2017 CLINICAL DATA:  Patient with chest pain and shortness of breath. EXAM: PORTABLE CHEST 1 VIEW COMPARISON:  Chest radiograph 09/30/2016. FINDINGS: Monitoring leads overlie the patient. Stable cardiomegaly. Aortic atherosclerosis. Pulmonary vascular redistribution and bilateral interstitial pulmonary opacities, unchanged from prior. Additionally there is persistent focal consolidation within the right upper lobe. No pleural effusion or pneumothorax. IMPRESSION: Persistent focal consolidative opacity within the right upper lobe. This needs further evaluated with chest CT. Cardiomegaly. Chronic interstitial opacities bilaterally. Electronically Signed   By: Lovey Newcomer M.D.   On: 02/17/2017 16:44   Dg Foot Complete Right  Result Date: 02/17/2017 CLINICAL DATA:  Bilateral foot pain and swelling for the  past week. No known injury. EXAM: RIGHT FOOT COMPLETE - 3+ VIEW COMPARISON:  05/17/2016. FINDINGS: Stable second metatarsal bone island. Stable calcaneal spurs. No visible soft tissue swelling. IMPRESSION: No acute  abnormality. Electronically Signed   By: Claudie Revering M.D.   On: 02/17/2017 16:44   US Abdomen Limited Ruq  Result Date: 02/21/2017 CLINICAL DATA:  Elevated liver enzymes EXAM: ULTRASOUND ABDOMEN LIMITED RIGHT UPPER QUADRANT COMPARISON:  Abdominal ultrasound May 22, 2016 and abdominal MRI June 05, 2016 FINDINGS: Gallbladder: Surgically absent. Common bile duct: Diameter: 6 mm. No intrahepatic or extrahepatic biliary duct dilatation. Liver: No focal lesion identified. Within normal limits in parenchymal echogenicity. Portal vein is patent on color Doppler imaging with normal direction of blood flow towards the liver. The previously noted echogenic area near the porta hepatis seen on prior ultrasound remains, likely fat immediately adjacent to the liver as opposed to a lesion arising from the liver. IMPRESSION: 1.  Gallbladder absent. 2. Echogenic area near the porta hepatis seen on prior ultrasound is again noted, likely due to fat immediately adjacent to the liver as opposed to a lesion arising from the liver. Note that the prior MR study did not show a liver lesion in this area. There is noted to be fat in this area on MR. No focal liver lesions are evident by ultrasound on this study. Electronically Signed   By: Lowella Grip III M.D.   On: 02/21/2017 09:41      Subjective: Pt says she feels much better.  She says her gout pain is much better and ambulating better, wants to go home.   Discharge Exam: Vitals:   02/21/17 0523 02/21/17 0824  BP: 131/73   Pulse: 90   Resp: 17   Temp: (!) 97.5 F (36.4 C)   SpO2: 92% 95%   Vitals:   02/20/17 1957 02/20/17 2149 02/21/17 0523 02/21/17 0824  BP:  128/61 131/73   Pulse:  90 90   Resp:  18 17   Temp:  (!) 97.4 F (36.3 C) (!) 97.5 F (36.4 C)   TempSrc:  Oral Oral   SpO2: 95% 91% 92% 95%  Weight:   65 kg (143 lb 6.4 oz)   Height:       General: Pt is alert, awake, not in acute distress Cardiovascular: RRR, S1/S2 +, no rubs, no  gallops Respiratory: CTA bilaterally, no wheezing, no rhonchi Abdominal: Soft, NT, ND, bowel sounds + Extremities: no edema, no cyanosis   The results of significant diagnostics from this hospitalization (including imaging, microbiology, ancillary and laboratory) are listed below for reference.     Microbiology: No results found for this or any previous visit (from the past 240 hour(s)).   Labs: BNP (last 3 results) Recent Labs    04/15/16 1423 08/22/16 0700 02/17/17 1549  BNP 458.0* 736.0* 814.4*   Basic Metabolic Panel: Recent Labs  Lab 02/17/17 1549 02/18/17 0256 02/19/17 0514 02/20/17 0548 02/21/17 0410  NA 135 136 134* 134* 134*  K 3.5 3.7 5.1 4.8 4.1  CL 96* 99* 98* 98* 95*  CO2 27 30 26 29 29   GLUCOSE 137* 97 124* 128* 110*  BUN 23* 23* 28* 32* 24*  CREATININE 1.29* 1.25* 1.45* 1.39* 1.28*  CALCIUM 9.6 9.1 9.0 9.1 9.4  MG  --   --   --  1.8  --   PHOS  --   --   --  3.9  --    Liver Function Tests: Recent Labs  Lab 02/17/17 1552 02/21/17 0410  AST 44* 46*  ALT 30 56  ALKPHOS 285* 398*  BILITOT 0.9 0.6  PROT 8.7* 7.3  ALBUMIN 3.8 3.2*   No results for input(s): LIPASE, AMYLASE in the last 168 hours. Recent Labs  Lab 02/19/17 1936 02/21/17 0410  AMMONIA 70* 28   CBC: Recent Labs  Lab 02/17/17 1549 02/18/17 0256 02/20/17 0548 02/21/17 0410  WBC 12.8* 8.3 6.9 7.0  NEUTROABS 9.2*  --   --   --   HGB 12.2 11.0* 10.3* 10.5*  HCT 38.0 34.4* 32.3* 33.2*  MCV 82.3 82.7 83.0 83.4  PLT 441* 372 378 421*   Cardiac Enzymes: Recent Labs  Lab 02/17/17 1549 02/17/17 2157 02/18/17 0255 02/18/17 0922  TROPONINI <0.03 0.03* 0.03* <0.03   BNP: Invalid input(s): POCBNP CBG: No results for input(s): GLUCAP in the last 168 hours. D-Dimer No results for input(s): DDIMER in the last 72 hours. Hgb A1c No results for input(s): HGBA1C in the last 72 hours. Lipid Profile No results for input(s): CHOL, HDL, LDLCALC, TRIG, CHOLHDL, LDLDIRECT in the  last 72 hours. Thyroid function studies Recent Labs    02/19/17 1936  TSH 5.881*   Anemia work up Recent Labs    02/19/17 1936  VITAMINB12 386   Urinalysis    Component Value Date/Time   COLORURINE STRAW (A) 09/30/2016 1204   APPEARANCEUR CLEAR 09/30/2016 1204   LABSPEC 1.004 (L) 09/30/2016 1204   PHURINE 7.0 09/30/2016 1204   GLUCOSEU NEGATIVE 09/30/2016 1204   HGBUR NEGATIVE 09/30/2016 1204   [place] 09/30/2016 1204   KETONESUR NEGATIVE 09/30/2016 1204   PROTEINUR NEGATIVE 09/30/2016 1204   UROBILINOGEN 0.2 02/21/2015 2028   NITRITE NEGATIVE 09/30/2016 1204   LEUKOCYTESUR SMALL (A) 09/30/2016 1204   Sepsis Labs Invalid input(s): PROCALCITONIN,  WBC,  LACTICIDVEN Microbiology No results found for this or any previous visit (from the past 240 hour(s)).  Time coordinating discharge: 34 mins  SIGNED:  Irwin Brakeman, MD  [facility] 02/21/2017, 10:27 AM Pager [phone]  If 7PM-7AM, please contact night-coverage [email or web address] Password [email or web address]

## 2017-02-21 NOTE — Plan of Care (Signed)
Continue plan of care.

## 2017-02-21 NOTE — Progress Notes (Signed)
AVS reviewed with patient and patient's daughter.  Verbalized understanding of discharge instructions, physician follow-up, medications.  Patient transported by NT via w/c to main entrance for discharge.  Patient stable at time of discharge.

## 2017-02-21 NOTE — Care Management Note (Deleted)
Case Management Note  Patient Details  Name: MARIJAH LARRANAGA MRN: [id number] Date of Birth: [birth year]    Expected Discharge Date:  02/21/17               Expected Discharge Plan:  Home/Self Care  In-House Referral:     Discharge planning Services  Other - See comment(Provider list)  Post Acute Care Choice:    Choice offered to:     DME Arranged:    DME Agency:     HH Arranged:    HH Agency:     Status of Service:  Completed, signed off  If discussed at [place] of Stay Meetings, dates discussed:    Additional Comments:Patient discharging today. No PCP, CM gave PCP list for patient to establish care. She will f/u with cardiologist and neurology as well.   Kerrie Latour, Chauncey [place], RN 02/21/2017, 10:47 AM

## 2017-02-21 NOTE — Discharge Instructions (Signed)
Follow with Primary MD  Tawni Carnes, PA-C  and other consultant's as instructed your Hospitalist MD  Please get a complete blood count and chemistry panel checked by your Primary MD at your next visit, and again as instructed by your Primary MD.  Get Medicines reviewed and adjusted: Please take all your medications with you for your next visit with your Primary MD  Laboratory/radiological data: Please request your Primary MD to go over all hospital tests and procedure/radiological results at the follow up, please ask your Primary MD to get all Hospital records sent to his/her office.  In some cases, they will be blood work, cultures and biopsy results pending at the time of your discharge. Please request that your primary care M.D. follows up on these results.  Also Note the following: If you experience worsening of your admission symptoms, develop shortness of breath, life threatening emergency, suicidal or homicidal thoughts you must seek medical attention immediately by calling 911 or calling your MD immediately  if symptoms less severe.  You must read complete instructions/literature along with all the possible adverse reactions/side effects for all the Medicines you take and that have been prescribed to you. Take any new Medicines after you have completely understood and accpet all the possible adverse reactions/side effects.   Do not drive when taking Pain medications or sleeping medications (Benzodaizepines)  Do not take more than prescribed Pain, Sleep and Anxiety Medications. It is not advisable to combine anxiety,sleep and pain medications without talking with your primary care practitioner  Special Instructions: If you have smoked or chewed Tobacco  in the last 2 yrs please stop smoking, stop any regular Alcohol  and or any Recreational drug use.  Wear Seat belts while driving.  Please note: You were cared for by a hospitalist during your hospital stay. Once you are  discharged, your primary care physician will handle any further medical issues. Please note that NO REFILLS for any discharge medications will be authorized once you are discharged, as it is imperative that you return to your primary care physician (or establish a relationship with a primary care physician if you do not have one) for your post hospital discharge needs so that they can reassess your need for medications and monitor your lab values.

## 2017-02-22 ENCOUNTER — Other Ambulatory Visit: Payer: Self-pay | Admitting: Cardiovascular Disease

## 2017-03-01 ENCOUNTER — Encounter: Payer: Self-pay | Admitting: Internal Medicine

## 2017-03-02 DIAGNOSIS — M109 Gout, unspecified: Secondary | ICD-10-CM | POA: Diagnosis not present

## 2017-03-02 DIAGNOSIS — I5033 Acute on chronic diastolic (congestive) heart failure: Secondary | ICD-10-CM | POA: Diagnosis not present

## 2017-03-02 DIAGNOSIS — R251 Tremor, unspecified: Secondary | ICD-10-CM | POA: Insufficient documentation

## 2017-03-02 DIAGNOSIS — F419 Anxiety disorder, unspecified: Secondary | ICD-10-CM | POA: Diagnosis not present

## 2017-03-02 DIAGNOSIS — I482 Chronic atrial fibrillation: Secondary | ICD-10-CM | POA: Diagnosis not present

## 2017-03-02 DIAGNOSIS — J440 Chronic obstructive pulmonary disease with acute lower respiratory infection: Secondary | ICD-10-CM | POA: Diagnosis not present

## 2017-03-02 DIAGNOSIS — K649 Unspecified hemorrhoids: Secondary | ICD-10-CM | POA: Diagnosis not present

## 2017-03-02 DIAGNOSIS — M545 Low back pain: Secondary | ICD-10-CM | POA: Diagnosis not present

## 2017-03-02 DIAGNOSIS — I10 Essential (primary) hypertension: Secondary | ICD-10-CM | POA: Diagnosis not present

## 2017-03-02 DIAGNOSIS — Z6828 Body mass index (BMI) 28.0-28.9, adult: Secondary | ICD-10-CM | POA: Diagnosis not present

## 2017-03-20 ENCOUNTER — Other Ambulatory Visit: Payer: Self-pay | Admitting: *Deleted

## 2017-03-20 MED ORDER — ISOSORBIDE MONONITRATE ER 30 MG PO TB24: 30.0000 mg | ORAL_TABLET | Freq: Every day | ORAL | 6 refills | Status: DC

## 2017-03-23 DIAGNOSIS — I5033 Acute on chronic diastolic (congestive) heart failure: Secondary | ICD-10-CM | POA: Diagnosis not present

## 2017-03-23 DIAGNOSIS — I482 Chronic atrial fibrillation: Secondary | ICD-10-CM | POA: Diagnosis not present

## 2017-03-23 DIAGNOSIS — R251 Tremor, unspecified: Secondary | ICD-10-CM | POA: Diagnosis not present

## 2017-03-23 DIAGNOSIS — M109 Gout, unspecified: Secondary | ICD-10-CM | POA: Diagnosis not present

## 2017-03-23 DIAGNOSIS — J440 Chronic obstructive pulmonary disease with acute lower respiratory infection: Secondary | ICD-10-CM | POA: Diagnosis not present

## 2017-03-23 DIAGNOSIS — I10 Essential (primary) hypertension: Secondary | ICD-10-CM | POA: Diagnosis not present

## 2017-03-23 DIAGNOSIS — M545 Low back pain: Secondary | ICD-10-CM | POA: Diagnosis not present

## 2017-03-23 DIAGNOSIS — K649 Unspecified hemorrhoids: Secondary | ICD-10-CM | POA: Diagnosis not present

## 2017-03-26 ENCOUNTER — Ambulatory Visit: Payer: Medicare Other | Admitting: Cardiovascular Disease

## 2017-04-09 ENCOUNTER — Other Ambulatory Visit: Payer: Self-pay | Admitting: *Deleted

## 2017-04-09 MED ORDER — METOPROLOL SUCCINATE ER 200 MG PO TB24: 200.0000 mg | ORAL_TABLET | Freq: Every day | ORAL | 1 refills | Status: DC

## 2017-04-24 ENCOUNTER — Other Ambulatory Visit: Payer: Self-pay

## 2017-04-24 ENCOUNTER — Encounter: Payer: Self-pay | Admitting: Internal Medicine

## 2017-04-24 ENCOUNTER — Ambulatory Visit (INDEPENDENT_AMBULATORY_CARE_PROVIDER_SITE_OTHER): Payer: Medicare Other | Admitting: Internal Medicine

## 2017-04-24 VITALS — BP 134/70 | HR 76 | Temp 97.0°F | Ht 61.0 in | Wt 154.0 lb

## 2017-04-24 DIAGNOSIS — K921 Melena: Secondary | ICD-10-CM

## 2017-04-24 DIAGNOSIS — K219 Gastro-esophageal reflux disease without esophagitis: Secondary | ICD-10-CM

## 2017-04-24 DIAGNOSIS — R945 Abnormal results of liver function studies: Secondary | ICD-10-CM

## 2017-04-24 DIAGNOSIS — R7989 Other specified abnormal findings of blood chemistry: Secondary | ICD-10-CM

## 2017-04-24 NOTE — Patient Instructions (Addendum)
Continue fiber-fortified diet  Will repeat hepatic function profile in 1 month  Further recommendations to follow at that time

## 2017-04-24 NOTE — Progress Notes (Signed)
Primary Care Physician:  Tawni Carnes, PA-C Primary Gastroenterologist:  Dr. Gala Romney  Pleasant 84 year old lady back for further evaluation of rectal bleeding. Patient states since seen last year here. Rectal bleeding has tapered off; bowel function has normalized. Recent 2017 colonoscopy results reviewed.  Patient taking a fiber fortified diet.  She's had quite a bit of difficulties with gout recently. Questionable porta hepatis lesion imaged with multiple modalities in the past year. MRI sorted this out and determined this to be a focal area of fat in the area of the falciform ligament. Her LFTs had been completely normal. However, 2 months ago she had an alkaline phosphatase noted be almost 400 and AST of 46. An ultrasound followed which demonstrated no hepatobiliary abnormalities to explain. No family history of liver disease per patient.  No new medications. She continues to be anticoagulated for atrial fibrillation. Reflux symptoms well controlled on Protonix 40 mg daily.    Past Medical History:  Diagnosis Date  . Anxiety   . Arthritis   . Atrial fibrillation ([place])   . Bursitis    Left shoulder  . Cataract   . COPD (chronic obstructive pulmonary disease) ([place])   . Coronary atherosclerosis of native coronary artery    a. DES to CX, moderately severe stenosis RCA, mild stenosis LAD 04/2013  . Depression   . Diastolic heart failure ([facility])    EF 55-60%  . Dysphagia, unspecified(787.20)   . Essential hypertension   . GERD (gastroesophageal reflux disease)    Hx Schatzki's ring, multiple EGD/ED last 01/06/2004  . Gout   . Headache   . History of anemia   . Hyperlipidemia   . Internal hemorrhoids without mention of complication   . MI (myocardial infarction) ([place]) 2006  . Microscopic colitis 2003  . OSA (obstructive sleep apnea)   . Panic disorder without agoraphobia   . Paresthesia   . Pneumonia 12/2011  . PVD (peripheral vascular disease) ([place])   . S/P colonoscopy  09/27/2001   internal hemorrhoids, desc colon inflam polyp, SB BX-chronic duodenitis, colitis  . Thyroid disease     Past Surgical History:  Procedure Laterality Date  . ABDOMINAL HYSTERECTOMY    . ABDOMINAL HYSTERECTOMY    . ANTERIOR AND POSTERIOR REPAIR     with resection of vagina  . ANTERIOR LAT LUMBAR FUSION N/A 08/01/2016   Procedure: Lumbar Two-Lumbar Five Transpsoas lateral interbody fusion with Lumbar Two-Three lateral plate fixation;  Surgeon: Kevan Ny Ditty, MD;  Location: [facility];  Service: Neurosurgery;  Laterality: N/A;  L2-5 Transpsoas lateral interbody fusion with L2-3 lateral plate fixation  . APPENDECTOMY    . BACK SURGERY    . BIOPSY  07/05/2015   Procedure: BIOPSY;  Surgeon: Daneil Dolin, MD;  [facility];  Service: Endoscopy;;  gastric polyp biopsy, ascending colon biopsy  . BLADDER SUSPENSION  11/09/2011   Procedure: TRANSVAGINAL TAPE (TVT) PROCEDURE;  Surgeon: Marissa Nestle, MD;  Location: [facility];  Service: Urology;  Laterality: N/A;  . bladder tack  06/2010  . BREAST LUMPECTOMY  1998   left, benign  . CARDIAC CATHETERIZATION    . CARDIAC CATHETERIZATION    . CARDIAC CATHETERIZATION N/A 12/16/2015   Procedure: Left Heart Cath and Coronary Angiography;  Surgeon: Troy Sine, MD;  Location: [facility];  Service: Cardiovascular;  Laterality: N/A;  . [place]   left  . CHOLECYSTECTOMY  1998  . Cholecystectomy    . COLONOSCOPY  03/16/2011   multiple hyperplastic colon polyps, sigmoid diverticulosis, melanosis coli  . COLONOSCOPY WITH PROPOFOL N/A 07/05/2015   RMR:one 5 mm polyp in descending colon  . CORONARY ANGIOPLASTY WITH STENT PLACEMENT    . ESOPHAGEAL DILATION N/A 07/05/2015   Procedure: ESOPHAGEAL DILATION;  Surgeon: Daneil Dolin, MD;  [facility];  Service: Endoscopy;  Laterality: N/A;  . ESOPHAGOGASTRODUODENOSCOPY (EGD) WITH PROPOFOL N/A 07/05/2015   QQP:[id number]  . JOINT REPLACEMENT Right 2007    right knee  . left hand surgery    . LEFT HEART CATHETERIZATION WITH CORONARY ANGIOGRAM N/A 05/14/2013   Procedure: LEFT HEART CATHETERIZATION WITH CORONARY ANGIOGRAM;  Surgeon: Blane Ohara, MD;  Location: [facility];  Service: Cardiovascular;  Laterality: N/A;  . left rotator cuff surgery    . LUMBAR LAMINECTOMY/DECOMPRESSION MICRODISCECTOMY N/A 10/11/2012   Procedure: LUMBAR LAMINECTOMY/DECOMPRESSION MICRODISCECTOMY 2 LEVELS;  Surgeon: Floyce Stakes, MD;  Location: [facility];  Service: Neurosurgery;  Laterality: N/A;  L3-4 L4-5 Laminectomy  . LUMBAR WOUND DEBRIDEMENT N/A 09/27/2015   Procedure: Exploration of Lumbar Wound w/ Repair CSF Leak/Lumbar Drain Placement;  Surgeon: Leeroy Cha, MD;  Location: [facility];  Service: Neurosurgery;  Laterality: N/A;  . MALONEY DILATION  03/16/2011   Gastritis. No H.pylori on bx. 26F maloney dilation with disruption of  occult cevical esophageal web  . NASAL SINUS SURGERY    . right knee replacement  2007  . right leg benign tumor    . SHOULDER SURGERY Left   . TONSILLECTOMY    . unspecified area, hysterectomy  1972   partial    Prior to Admission medications   Medication Sig Start Date End Date Taking? Authorizing Provider  acetaminophen (TYLENOL) 500 MG tablet Take 500 mg by mouth daily as needed for headache.    Yes [provider]  allopurinol (ZYLOPRIM) 100 MG tablet Take 1 tablet (100 mg total) daily by mouth. 02/21/17 04/24/17 Yes Johnson, Clanford L, MD  ALPRAZolam (XANAX) 0.25 MG tablet Take 0.25 mg by mouth 2 (two) times daily as needed. 09/04/16  Yes [provider]  colchicine 0.6 MG tablet Take 1 tablet (0.6 mg total) daily by mouth. 02/21/17 04/24/17 Yes Johnson, Clanford L, MD  DULoxetine (CYMBALTA) 60 MG capsule Take 60 mg by mouth at bedtime.    Yes [provider]  ELIQUIS 5 MG TABS tablet TAKE ONE TABLET BY MOUTH TWICE DAILY. 11/17/16  Yes Herminio Commons, MD  fluticasone (FLONASE) 50 MCG/ACT  nasal spray Place 2 sprays into both nostrils 2 (two) times daily as needed for allergies.    Yes [provider]  furosemide (LASIX) 20 MG tablet Take 20 mg by mouth daily as needed for fluid. Take in addition to the 40mg  if needed for swelling   Yes [provider]  furosemide (LASIX) 40 MG tablet TAKE ONE TABLET BY MOUTH DAILY. 02/22/17  Yes Herminio Commons, MD  gabapentin (NEURONTIN) 300 MG capsule Take 1 capsule (300 mg total) by mouth 3 (three) times daily. 10/08/15  Yes Ditty, Kevan Ny, MD  hydrocortisone (ANUSOL-HC) 25 MG suppository Place 1 suppository (25 mg total) rectally every 12 (twelve) hours. 04/26/16  Yes Walden Field A, NP  ipratropium-albuterol (DUONEB) 0.5-2.5 (3) MG/3ML SOLN Take 3 mLs by nebulization every 6 (six) hours. 12/18/15  Yes Regalado, Belkys A, MD  isosorbide mononitrate (IMDUR) 30 MG 24 hr tablet Take 1 tablet (30 mg total) by mouth daily. 03/20/17 04/24/17 Yes Herminio Commons, MD  Magnesium Oxide 400 (240 Mg) MG TABS TAKE ONE TABLET BY MOUTH DAILY. 02/22/17  Yes Herminio Commons, MD  methocarbamol (ROBAXIN) 500 MG tablet Take 500 mg by mouth every 6 (six) hours as needed for muscle spasms.    Yes [provider]  metoprolol (TOPROL-XL) 200 MG 24 hr tablet Take 1 tablet (200 mg total) by mouth daily. 04/09/17 05/09/17 Yes Herminio Commons, MD  Multiple Vitamin (MULTIVITAMIN WITH MINERALS) TABS tablet Take 1 tablet by mouth daily with lunch. Centrum   Yes [provider]  nitroGLYCERIN (NITROSTAT) 0.4 MG SL tablet Place 0.4 mg under the tongue every 5 (five) minutes as needed for chest pain. Reported on 08/04/2015   Yes [provider]  oxyCODONE (ROXICODONE) 5 MG immediate release tablet Take one tablet by mouth twice daily as needed for pain 08/11/16  Yes Reed, Tiffany L, DO  pantoprazole (PROTONIX) 40 MG tablet Take 40 mg by mouth daily.    Yes [provider]  potassium chloride (K-DUR,KLOR-CON) 10 MEQ  tablet TAKE TWO (2) TABLETS BY MOUTH DAILY. 11/17/16  Yes Herminio Commons, MD    Allergies as of 04/24/2017 - Review Complete 04/24/2017  Allergen Reaction Noted  . Cephalosporins Diarrhea and Nausea Only 08/04/2015  . Levaquin [levofloxacin in d5w] Swelling 07/06/2014  . Macrodantin [nitrofurantoin macrocrystal] Swelling 07/06/2014  . Phenothiazines Anaphylaxis and Hives 08/16/2011  . Polysorbate Anaphylaxis 08/16/2011  . Prednisone Shortness Of Breath   . Buspirone Itching 07/06/2014  . Cardura [doxazosin mesylate] Itching 07/06/2014  . Codeine Itching   . Acyclovir and related Itching 10/07/2015  . Prochlorperazine Other (See Comments) 08/16/2011  . Ranexa [ranolazine]  04/06/2015  . Atorvastatin Hives 08/16/2011  . Ofloxacin Rash   . Other Itching and Rash 10/10/2012  . Penicillins Other (See Comments) 11/09/2011  . Pimozide Hives and Itching 08/16/2011    Family History  Problem Relation Age of Onset  . Stroke Mother   . Parkinson's disease Father   . Coronary artery disease Other        family Hx-sons  . Cancer Other   . Stroke Other        family Hx  . Hypertension Other        family Hx  . Diabetes Brother   . Heart disease Son        before age 68  . Diabetes Son   . Stroke Daughter 64  . Colon cancer Neg Hx     Social History   Socioeconomic History  . Marital status: Divorced    Spouse name: Not on file  . Number of children: 5  . Years of education: Not on file  . Highest education level: Not on file  Social Needs  . Financial resource strain: Not on file  . Food insecurity - worry: Not on file  . Food insecurity - inability: Not on file  . Transportation needs - medical: Not on file  . Transportation needs - non-medical: Not on file  Occupational History  . Occupation: retired  Tobacco Use  . Smoking status: Former Smoker    Packs/day: 1.00    Years: 64.00    Pack years: 64.00    Types: Cigarettes    Start date: 12/24/1947    Last  attempt to quit: 11/17/2001    Years since quitting: 15.4  . Smokeless tobacco: Never Used  . Tobacco comment: Quit smoking in 2003  Substance and Sexual Activity  . Alcohol use: No    Alcohol/week:  0.0 oz  . Drug use: No  . Sexual activity: No  Other Topics Concern  . Not on file  Social History Narrative   Divorced.   Sister had colon perforation & died from complications in [place], [place]    Review of Systems: See HPI, otherwise negative ROS  Physical Exam: BP 134/70   Pulse 76   Temp (!) 97 F (36.1 C) (Oral)   Ht 5\' 1"  (1.549 m)   Wt 154 lb (69.9 kg)   BMI 29.10 kg/m  General:  pleasant elderly female; alert, oriented cooperative in NAD Neck:  Supple; no masses or thyromegaly. No significant cervical adenopathy. Lungs:  Clear throughout to auscultation.   No wheezes, crackles, or rhonchi. No acute distress. Heart:  Regular rate and rhythm; no murmurs, clicks, rubs,  or gallops. Abdomen: Non-distended, normal bowel sounds.  Soft and nontender without appreciable mass or hepatosplenomegaly.  Pulses:  Normal pulses noted. Extremities:  Without clubbing or edema.  Impression:  Pleasant 84 year old lady with a paper hematochezia in the setting of chronic anticoagulation.  Bleeding has tapered off. She is moving her bowels better with fiber fortified diet.  Recent LFTs significant for an elevation in alkaline phosphatase; minimally elevated AST. This is of uncertain significance.   Recommendations: Continue fiber-fortified diet  Will repeat hepatic function profile in 1 month  Further recommendations to follow at that time    Notice: This dictation was prepared with Dragon dictation along with smaller phrase technology. Any transcriptional errors that result from this process are unintentional and may not be corrected upon review.

## 2017-05-05 ENCOUNTER — Other Ambulatory Visit: Payer: Self-pay | Admitting: Cardiovascular Disease

## 2017-05-07 ENCOUNTER — Other Ambulatory Visit: Payer: Self-pay

## 2017-05-07 DIAGNOSIS — R945 Abnormal results of liver function studies: Secondary | ICD-10-CM

## 2017-05-07 DIAGNOSIS — R7989 Other specified abnormal findings of blood chemistry: Secondary | ICD-10-CM

## 2017-05-08 ENCOUNTER — Encounter: Payer: Self-pay | Admitting: Cardiovascular Disease

## 2017-05-08 ENCOUNTER — Ambulatory Visit: Payer: Medicare Other | Admitting: Neurology

## 2017-05-08 ENCOUNTER — Ambulatory Visit (INDEPENDENT_AMBULATORY_CARE_PROVIDER_SITE_OTHER): Payer: Medicare Other | Admitting: Cardiovascular Disease

## 2017-05-08 VITALS — BP 116/84 | HR 88 | Ht 61.0 in | Wt 157.0 lb

## 2017-05-08 DIAGNOSIS — I10 Essential (primary) hypertension: Secondary | ICD-10-CM

## 2017-05-08 DIAGNOSIS — E785 Hyperlipidemia, unspecified: Secondary | ICD-10-CM | POA: Diagnosis not present

## 2017-05-08 DIAGNOSIS — I209 Angina pectoris, unspecified: Secondary | ICD-10-CM

## 2017-05-08 DIAGNOSIS — I25118 Atherosclerotic heart disease of native coronary artery with other forms of angina pectoris: Secondary | ICD-10-CM | POA: Diagnosis not present

## 2017-05-08 DIAGNOSIS — Z955 Presence of coronary angioplasty implant and graft: Secondary | ICD-10-CM | POA: Diagnosis not present

## 2017-05-08 DIAGNOSIS — I482 Chronic atrial fibrillation, unspecified: Secondary | ICD-10-CM

## 2017-05-08 DIAGNOSIS — Z9289 Personal history of other medical treatment: Secondary | ICD-10-CM

## 2017-05-08 DIAGNOSIS — R0602 Shortness of breath: Secondary | ICD-10-CM | POA: Diagnosis not present

## 2017-05-08 DIAGNOSIS — I5043 Acute on chronic combined systolic (congestive) and diastolic (congestive) heart failure: Secondary | ICD-10-CM

## 2017-05-08 NOTE — Addendum Note (Signed)
Addended by: Barbarann Ehlers A on: 05/08/2017 01:27 PM   Modules accepted: Orders

## 2017-05-08 NOTE — Patient Instructions (Addendum)
Your physician wants you to follow-up in: 3-4 weeks with Dr.Koneswaran     Take lasix 40 mg twice a day for the next 5 days, then go back to 40 mg daily  Get lab work :BMET in 3 days   No  tests ordered today.       Thank you for choosing [facility] !

## 2017-05-08 NOTE — Progress Notes (Signed)
SUBJECTIVE: The patient presents for routine follow-up. She has chronic systolic heart failure and atrial fibrillation. Echocardiogram on 04/17/16 demonstrated moderately reduced left ventricle systolic function, LVEF 41-74%.  She underwent percutaneous coronary intervention with drug-eluting stent to the left circumflex coronary artery in January 2015.  Coronary angiography 12/16/15 showed 20% stenosis in the proximal LAD and in the inferior branch of the first diagonal with a widely patent previously placed circumflex stent. RCA was small and nondominant with 80% ostial stenosis. Medical therapy was recommended.  She was hospitalized in November 2018 and she had rapid atrial fibrillation.  Imdur was added and Toprol-XL was increased to 200 mg daily.  She had an acute gout flare in her ankles at that time.  Her weight is up 14 pounds since February 21, 2017.  She denies leg swelling.  She does have exertional dyspnea when her weight goes up.  She thinks she retains her fluid in her abdomen.  Her primary complaints today relate to bilateral ankle and feet pain related to gout.  She also has chronic lower back pain.  Most recent base metabolic panel was performed on 02/21/17: Sodium 134, potassium 4.1, BUN 24, creatinine 1.28.    Review of Systems: As per "subjective", otherwise negative.  Allergies  Allergen Reactions  . Cephalosporins Diarrhea and Nausea Only    Lightheaded  . Levaquin [Levofloxacin In D5w] Swelling  . Macrodantin [Nitrofurantoin Macrocrystal] Swelling  . Phenothiazines Anaphylaxis and Hives  . Polysorbate Anaphylaxis  . Prednisone Shortness Of Breath  . Buspirone Itching  . Cardura [Doxazosin Mesylate] Itching  . Codeine Itching  . Acyclovir And Related Itching    Redness of skin  . Prochlorperazine Other (See Comments)    "Upset stomach"  . Ranexa [Ranolazine]     Severe drop in BP  . Atorvastatin Hives    Cramping; tolerates Crestor ok  . Ofloxacin Rash   . Other Itching and Rash    "WOOL"= make skin look like it has been burned  . Penicillins Other (See Comments)    Causes redness all over. Has patient had a PCN reaction causing immediate rash, facial/tongue/throat swelling, SOB or lightheadedness with hypotension: No Has patient had a PCN reaction causing severe rash involving mucus membranes or skin necrosis: No Has patient had a PCN reaction that required hospitalization No Has patient had a PCN reaction occurring within the last 10 years: No If all of the above answers are "NO", then may proceed with Cephalosporin use.   . Pimozide Hives and Itching    Current Outpatient Medications  Medication Sig Dispense Refill  . acetaminophen (TYLENOL) 500 MG tablet Take 500 mg by mouth daily as needed for headache.     . ALPRAZolam (XANAX) 0.25 MG tablet Take 0.25 mg by mouth 2 (two) times daily as needed.  0  . DULoxetine (CYMBALTA) 60 MG capsule Take 60 mg by mouth at bedtime.     [name] Kitchen ELIQUIS 5 MG TABS tablet TAKE ONE TABLET BY MOUTH TWICE DAILY. 180 tablet 3  . fluticasone (FLONASE) 50 MCG/ACT nasal spray Place 2 sprays into both nostrils 2 (two) times daily as needed for allergies.     . furosemide (LASIX) 20 MG tablet Take 20 mg by mouth daily as needed for fluid. Take in addition to the 40mg  if needed for swelling    . furosemide (LASIX) 40 MG tablet TAKE ONE TABLET BY MOUTH DAILY. 90 tablet 3  . gabapentin (NEURONTIN) 300 MG capsule Take 1  capsule (300 mg total) by mouth 3 (three) times daily. 90 capsule 2  . hydrocortisone (ANUSOL-HC) 25 MG suppository Place 1 suppository (25 mg total) rectally every 12 (twelve) hours. 12 suppository 1  . ipratropium-albuterol (DUONEB) 0.5-2.5 (3) MG/3ML SOLN Take 3 mLs by nebulization every 6 (six) hours. 360 mL 0  . Magnesium Oxide 400 (240 Mg) MG TABS TAKE ONE TABLET BY MOUTH DAILY. 90 tablet 3  . methocarbamol (ROBAXIN) 500 MG tablet Take 500 mg by mouth every 6 (six) hours as needed for muscle spasms.      . metoprolol (TOPROL-XL) 200 MG 24 hr tablet TAKE ONE TABLET BY MOUTH DAILY. 30 tablet 1  . Multiple Vitamin (MULTIVITAMIN WITH MINERALS) TABS tablet Take 1 tablet by mouth daily with lunch. Centrum    . nitroGLYCERIN (NITROSTAT) 0.4 MG SL tablet Place 0.4 mg under the tongue every 5 (five) minutes as needed for chest pain. Reported on 08/04/2015    . oxyCODONE (ROXICODONE) 5 MG immediate release tablet Take one tablet by mouth twice daily as needed for pain 60 tablet 0  . pantoprazole (PROTONIX) 40 MG tablet Take 40 mg by mouth daily.     . potassium chloride (K-DUR,KLOR-CON) 10 MEQ tablet TAKE TWO (2) TABLETS BY MOUTH DAILY. 180 tablet 3  . allopurinol (ZYLOPRIM) 100 MG tablet Take 1 tablet (100 mg total) daily by mouth. 30 tablet 0  . colchicine 0.6 MG tablet Take 1 tablet (0.6 mg total) daily by mouth. 30 tablet 0  . isosorbide mononitrate (IMDUR) 30 MG 24 hr tablet Take 1 tablet (30 mg total) by mouth daily. 30 tablet 6   No current facility-administered medications for this visit.     Past Medical History:  Diagnosis Date  . Anxiety   . Arthritis   . Atrial fibrillation ([place])   . Bursitis    Left shoulder  . Cataract   . COPD (chronic obstructive pulmonary disease) ([place])   . Coronary atherosclerosis of native coronary artery    a. DES to CX, moderately severe stenosis RCA, mild stenosis LAD 04/2013  . Depression   . Diastolic heart failure ([facility])    EF 55-60%  . Dysphagia, unspecified(787.20)   . Essential hypertension   . GERD (gastroesophageal reflux disease)    Hx Schatzki's ring, multiple EGD/ED last 01/06/2004  . Gout   . Headache   . History of anemia   . Hyperlipidemia   . Internal hemorrhoids without mention of complication   . MI (myocardial infarction) ([place]) 2006  . Microscopic colitis 2003  . OSA (obstructive sleep apnea)   . Panic disorder without agoraphobia   . Paresthesia   . Pneumonia 12/2011  . PVD (peripheral vascular disease) ([place])   . S/P  colonoscopy 09/27/2001   internal hemorrhoids, desc colon inflam polyp, SB BX-chronic duodenitis, colitis  . Thyroid disease     Past Surgical History:  Procedure Laterality Date  . ABDOMINAL HYSTERECTOMY    . ABDOMINAL HYSTERECTOMY    . ANTERIOR AND POSTERIOR REPAIR     with resection of vagina  . ANTERIOR LAT LUMBAR FUSION N/A 08/01/2016   Procedure: Lumbar Two-Lumbar Five Transpsoas lateral interbody fusion with Lumbar Two-Three lateral plate fixation;  Surgeon: Kevan Ny Ditty, MD;  Location: [facility];  Service: Neurosurgery;  Laterality: N/A;  L2-5 Transpsoas lateral interbody fusion with L2-3 lateral plate fixation  . APPENDECTOMY    . BACK SURGERY    . BIOPSY  07/05/2015   Procedure: BIOPSY;  Surgeon: Cristopher Estimable  Rourk, MD;  Location: [facility];  Service: Endoscopy;;  gastric polyp biopsy, ascending colon biopsy  . BLADDER SUSPENSION  11/09/2011   Procedure: TRANSVAGINAL TAPE (TVT) PROCEDURE;  Surgeon: Marissa Nestle, MD;  Location: [facility];  Service: Urology;  Laterality: N/A;  . bladder tack  06/2010  . BREAST LUMPECTOMY  1998   left, benign  . CARDIAC CATHETERIZATION    . CARDIAC CATHETERIZATION    . CARDIAC CATHETERIZATION N/A 12/16/2015   Procedure: Left Heart Cath and Coronary Angiography;  Surgeon: Troy Sine, MD;  Location: [facility];  Service: Cardiovascular;  Laterality: N/A;  . [place]   left  . CHOLECYSTECTOMY  1998  . Cholecystectomy    . COLONOSCOPY  03/16/2011   multiple hyperplastic colon polyps, sigmoid diverticulosis, melanosis coli  . COLONOSCOPY WITH PROPOFOL N/A 07/05/2015   RMR:one 5 mm polyp in descending colon  . CORONARY ANGIOPLASTY WITH STENT PLACEMENT    . ESOPHAGEAL DILATION N/A 07/05/2015   Procedure: ESOPHAGEAL DILATION;  Surgeon: Daneil Dolin, MD;  [facility]: [facility];  Service: Endoscopy;  Laterality: N/A;  . ESOPHAGOGASTRODUODENOSCOPY (EGD) WITH PROPOFOL N/A 07/05/2015   [id number]  . JOINT REPLACEMENT  Right 2007   right knee  . left hand surgery    . LEFT HEART CATHETERIZATION WITH CORONARY ANGIOGRAM N/A 05/14/2013   Procedure: LEFT HEART CATHETERIZATION WITH CORONARY ANGIOGRAM;  Surgeon: Blane Ohara, MD;  Location: [facility];  Service: Cardiovascular;  Laterality: N/A;  . left rotator cuff surgery    . LUMBAR LAMINECTOMY/DECOMPRESSION MICRODISCECTOMY N/A 10/11/2012   Procedure: LUMBAR LAMINECTOMY/DECOMPRESSION MICRODISCECTOMY 2 LEVELS;  Surgeon: Floyce Stakes, MD;  Location: [facility];  Service: Neurosurgery;  Laterality: N/A;  L3-4 L4-5 Laminectomy  . LUMBAR WOUND DEBRIDEMENT N/A 09/27/2015   Procedure: Exploration of Lumbar Wound w/ Repair CSF Leak/Lumbar Drain Placement;  Surgeon: Leeroy Cha, MD;  Location: [facility];  Service: Neurosurgery;  Laterality: N/A;  . MALONEY DILATION  03/16/2011   Gastritis. No H.pylori on bx. 8F maloney dilation with disruption of  occult cevical esophageal web  . NASAL SINUS SURGERY    . right knee replacement  2007  . right leg benign tumor    . SHOULDER SURGERY Left   . TONSILLECTOMY    . unspecified area, hysterectomy  1972   partial    Social History   Socioeconomic History  . Marital status: Divorced    Spouse name: Not on file  . Number of children: 5  . Years of education: Not on file  . Highest education level: Not on file  Social Needs  . Financial resource strain: Not on file  . Food insecurity - worry: Not on file  . Food insecurity - inability: Not on file  . Transportation needs - medical: Not on file  . Transportation needs - non-medical: Not on file  Occupational History  . Occupation: retired  Tobacco Use  . Smoking status: Former Smoker    Packs/day: 1.00    Years: 64.00    Pack years: 64.00    Types: Cigarettes    Start date: 12/24/1947    Last attempt to quit: 11/17/2001    Years since quitting: 15.4  . Smokeless tobacco: Never Used  . Tobacco comment: Quit smoking in 2003  Substance and Sexual  Activity  . Alcohol use: No    Alcohol/week: 0.0 oz  . Drug use: No  . Sexual activity: No  Other Topics  Concern  . Not on file  Social History Narrative   Divorced.   Sister had colon perforation & died from complications in [place], [place]     Vitals:   05/08/17 1303  BP: 116/84  Pulse: 88  SpO2: 93%  Weight: 157 lb (71.2 kg)  Height: 5\' 1"  (1.549 m)    Wt Readings from Last 3 Encounters:  05/08/17 157 lb (71.2 kg)  04/24/17 154 lb (69.9 kg)  02/21/17 143 lb 6.4 oz (65 kg)     PHYSICAL EXAM General: NAD HEENT: Normal. Neck: No JVD, no thyromegaly. Lungs: Clear to auscultation bilaterally with normal respiratory effort. CV: Regular rate and irregular rhythm, normal S1/S2, no S3, no murmur. No pretibial or periankle edema.  No carotid bruit.   Abdomen: Soft, nontender, no distention.  Neurologic: Alert and oriented.  Psych: Normal affect. Skin: Normal. Musculoskeletal: No gross deformities.    ECG: Most recent ECG reviewed.   Labs: Lab Results  Component Value Date/Time   K 4.1 02/21/2017 04:10 AM   BUN 24 (H) 02/21/2017 04:10 AM   CREATININE 1.28 (H) 02/21/2017 04:10 AM   CREATININE 1.17 (H) 02/13/2011 03:26 PM   ALT 54 02/21/2017 04:10 AM   TSH 5.881 (H) 02/19/2017 07:36 PM   TSH 2.770 11/25/2013 02:14 PM   HGB 10.5 (L) 02/21/2017 04:10 AM     Lipids: Lab Results  Component Value Date/Time   LDLCALC 103 (H) 12/16/2015 04:35 AM   CHOL 177 12/16/2015 04:35 AM   TRIG 197 (H) 12/16/2015 04:35 AM   HDL 35 (L) 12/16/2015 04:35 AM       ASSESSMENT AND PLAN: 1. CAD with DES to LCx:  Symptomatically stable.  Continue Toprol-XL 200 mg daily and Imdur 30 mg. Given small nondominant RCA, medical therapy is warranted. Statin intolerant. She is no longer on aspirin.  She is on Eliquis for atrial fibrillation.  2. Essential HTN: Controlled. No changes.  3.  Acute on chronic combined systolic and diastolic heart failure, EF 30-35% (previously 55%): Her primary  symptoms are exertional dyspnea and a sense of abdominal fullness.  Heart rate is controlled on Toprol-XL 200 mg daily.  Her weight has significantly increased, up by 14 pounds since February 21, 2017.  I will increase Lasix to 40 mg twice daily and check a basic metabolic panel within a few days.  I will then reduced back to 40 mg daily. Will hold off on ACEI/ARB. 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   4. Hyperlipidemia: Statin intolerant.  5. Aortic regurgitation: Will monitor clinically and with surveillance echocardiograms. Mild at present.  6. Atrial fibrillation: Heart rate is controlled on Toprol-XL 200 mg daily. Continue Eliquis.      Disposition: Follow up 3-4 weeks   Kate Sable, M.D., F.A.C.C.

## 2017-05-11 ENCOUNTER — Other Ambulatory Visit (HOSPITAL_COMMUNITY)
Admission: RE | Admit: 2017-05-11 | Discharge: 2017-05-11 | Disposition: A | Discharge status: Home / Self Care | Payer: Medicare Other | Source: Ambulatory Visit | Attending: Cardiovascular Disease | Admitting: Cardiovascular Disease

## 2017-05-11 ENCOUNTER — Other Ambulatory Visit (HOSPITAL_COMMUNITY)
Admission: RE | Admit: 2017-05-11 | Discharge: 2017-05-11 | Disposition: A | Discharge status: Home / Self Care | Payer: Medicare Other | Source: Ambulatory Visit | Attending: Internal Medicine | Admitting: Internal Medicine

## 2017-05-11 DIAGNOSIS — I10 Essential (primary) hypertension: Secondary | ICD-10-CM | POA: Insufficient documentation

## 2017-05-11 DIAGNOSIS — R945 Abnormal results of liver function studies: Secondary | ICD-10-CM | POA: Diagnosis present

## 2017-05-11 DIAGNOSIS — R0602 Shortness of breath: Secondary | ICD-10-CM | POA: Diagnosis not present

## 2017-05-11 LAB — BASIC METABOLIC PANEL
Anion gap: 11 (ref 5–15)
BUN: 14 mg/dL (ref 6–20)
CALCIUM: 9.2 mg/dL (ref 8.9–10.3)
CHLORIDE: 99 mmol/L — AB (ref 101–111)
CO2: 28 mmol/L (ref 22–32)
CREATININE: 1.12 mg/dL — AB (ref 0.44–1.00)
GFR calc non Af Amer: 44 mL/min — ABNORMAL LOW (ref >60)
GFR, EST AFRICAN AMERICAN: 52 mL/min — AB (ref >60)
GLUCOSE: 74 mg/dL (ref 65–99)
Potassium: 3.9 mmol/L (ref 3.5–5.1)
Sodium: 138 mmol/L (ref 135–145)

## 2017-05-11 LAB — HEPATIC FUNCTION PANEL
ALT: 28 U/L (ref 14–54)
AST: 42 U/L — AB (ref 15–41)
Albumin: 3.6 g/dL (ref 3.5–5.0)
Alkaline Phosphatase: 165 U/L — ABNORMAL HIGH (ref 38–126)
BILIRUBIN DIRECT: 0.1 mg/dL (ref 0.1–0.5)
BILIRUBIN TOTAL: 0.5 mg/dL (ref 0.3–1.2)
Indirect Bilirubin: 0.4 mg/dL (ref 0.3–0.9)
Total Protein: 7.6 g/dL (ref 6.5–8.1)

## 2017-05-16 ENCOUNTER — Other Ambulatory Visit: Payer: Self-pay

## 2017-05-16 DIAGNOSIS — R7989 Other specified abnormal findings of blood chemistry: Secondary | ICD-10-CM

## 2017-05-16 DIAGNOSIS — R945 Abnormal results of liver function studies: Principal | ICD-10-CM

## 2017-06-07 DIAGNOSIS — J440 Chronic obstructive pulmonary disease with acute lower respiratory infection: Secondary | ICD-10-CM | POA: Diagnosis not present

## 2017-06-07 DIAGNOSIS — M109 Gout, unspecified: Secondary | ICD-10-CM | POA: Diagnosis not present

## 2017-06-07 DIAGNOSIS — I10 Essential (primary) hypertension: Secondary | ICD-10-CM | POA: Diagnosis not present

## 2017-06-07 DIAGNOSIS — M545 Low back pain: Secondary | ICD-10-CM | POA: Diagnosis not present

## 2017-06-07 DIAGNOSIS — K649 Unspecified hemorrhoids: Secondary | ICD-10-CM | POA: Diagnosis not present

## 2017-06-07 DIAGNOSIS — I482 Chronic atrial fibrillation: Secondary | ICD-10-CM | POA: Diagnosis not present

## 2017-06-07 DIAGNOSIS — F419 Anxiety disorder, unspecified: Secondary | ICD-10-CM | POA: Diagnosis not present

## 2017-06-07 DIAGNOSIS — Z6828 Body mass index (BMI) 28.0-28.9, adult: Secondary | ICD-10-CM | POA: Diagnosis not present

## 2017-06-07 DIAGNOSIS — R251 Tremor, unspecified: Secondary | ICD-10-CM | POA: Diagnosis not present

## 2017-06-07 DIAGNOSIS — I5033 Acute on chronic diastolic (congestive) heart failure: Secondary | ICD-10-CM | POA: Diagnosis not present

## 2017-06-11 ENCOUNTER — Ambulatory Visit: Payer: Medicare Other | Admitting: Cardiovascular Disease

## 2017-06-12 ENCOUNTER — Encounter: Payer: Self-pay | Admitting: Cardiovascular Disease

## 2017-06-14 ENCOUNTER — Encounter: Payer: Self-pay | Admitting: Cardiovascular Disease

## 2017-06-14 ENCOUNTER — Ambulatory Visit (INDEPENDENT_AMBULATORY_CARE_PROVIDER_SITE_OTHER): Payer: Medicare Other | Admitting: Cardiovascular Disease

## 2017-06-14 VITALS — BP 128/74 | HR 94 | Ht 61.0 in | Wt 151.0 lb

## 2017-06-14 DIAGNOSIS — Z955 Presence of coronary angioplasty implant and graft: Secondary | ICD-10-CM | POA: Diagnosis not present

## 2017-06-14 DIAGNOSIS — I5042 Chronic combined systolic (congestive) and diastolic (congestive) heart failure: Secondary | ICD-10-CM

## 2017-06-14 DIAGNOSIS — I10 Essential (primary) hypertension: Secondary | ICD-10-CM

## 2017-06-14 DIAGNOSIS — R0602 Shortness of breath: Secondary | ICD-10-CM | POA: Diagnosis not present

## 2017-06-14 DIAGNOSIS — R079 Chest pain, unspecified: Secondary | ICD-10-CM

## 2017-06-14 DIAGNOSIS — E785 Hyperlipidemia, unspecified: Secondary | ICD-10-CM | POA: Diagnosis not present

## 2017-06-14 DIAGNOSIS — I25118 Atherosclerotic heart disease of native coronary artery with other forms of angina pectoris: Secondary | ICD-10-CM | POA: Diagnosis not present

## 2017-06-14 MED ORDER — METOPROLOL SUCCINATE ER 25 MG PO TB24: 25.0000 mg | ORAL_TABLET | Freq: Every day | ORAL | 3 refills | Status: DC

## 2017-06-14 MED ORDER — ISOSORBIDE MONONITRATE ER 60 MG PO TB24: 60.0000 mg | ORAL_TABLET | Freq: Every day | ORAL | 3 refills | Status: DC

## 2017-06-14 NOTE — Patient Instructions (Addendum)
Your physician wants you to follow-up in: 2-3 weeks       INCREASE Toprol to 225 mg daily  You will take your 200 mg tablet PLUS a 25 mg tablet    INCREASE Imdur to 60 mg daily     Your physician has requested that you have a lexiscan myoview. For further information please visit [email or web address]. Please follow instruction sheet, as given.       Thank you for choosing [facility] !

## 2017-06-14 NOTE — Progress Notes (Signed)
SUBJECTIVE: The patient presents for follow-up of acute on chronic combined systolic and diastolic heart failure.  Her weight is down 6 pounds since her last visit with me on 05/08/17.  However, she complains of dyspnea with minimal exertion.  Nuclear stress test on 10/07/15 was normal.  She has also been experiencing chest discomfort primarily in the subxiphoid region radiating to the left arm with exertion.  She is also being treated for gout in her feet by her PCP.  She has been experiencing some headaches as well.  She said her appetite has diminished.  She underwent percutaneous coronary intervention with drug-eluting stent to the left circumflex coronary artery in January 2015.  Coronary angiography 12/16/15 showed 20% stenosis in the proximal LAD and in the inferior branch of the first diagonal with a widely patent previously placed circumflex stent. RCA was small and nondominant with 80% ostial stenosis. Medical therapy was recommended.      Review of Systems: As per "subjective", otherwise negative.  Allergies  Allergen Reactions  . Cephalosporins Diarrhea and Nausea Only    Lightheaded  . Levaquin [Levofloxacin In D5w] Swelling  . Macrodantin [Nitrofurantoin Macrocrystal] Swelling  . Phenothiazines Anaphylaxis and Hives  . Polysorbate Anaphylaxis  . Prednisone Shortness Of Breath  . Buspirone Itching  . Cardura [Doxazosin Mesylate] Itching  . Codeine Itching  . Acyclovir And Related Itching    Redness of skin  . Prochlorperazine Other (See Comments)    "Upset stomach"  . Ranexa [Ranolazine]     Severe drop in BP  . Atorvastatin Hives    Cramping; tolerates Crestor ok  . Ofloxacin Rash  . Other Itching and Rash    "WOOL"= make skin look like it has been burned  . Penicillins Other (See Comments)    Causes redness all over. Has patient had a PCN reaction causing immediate rash, facial/tongue/throat swelling, SOB or lightheadedness with hypotension: No Has  patient had a PCN reaction causing severe rash involving mucus membranes or skin necrosis: No Has patient had a PCN reaction that required hospitalization No Has patient had a PCN reaction occurring within the last 10 years: No If all of the above answers are "NO", then may proceed with Cephalosporin use.   . Pimozide Hives and Itching    Current Outpatient Medications  Medication Sig Dispense Refill  . acetaminophen (TYLENOL) 500 MG tablet Take 500 mg by mouth daily as needed for headache.     . ALPRAZolam (XANAX) 0.25 MG tablet Take 0.25 mg by mouth 2 (two) times daily as needed.  0  . colchicine 0.6 MG tablet Take 1 tablet (0.6 mg total) daily by mouth. 30 tablet 0  . DULoxetine (CYMBALTA) 60 MG capsule Take 60 mg by mouth at bedtime.     [name] Kitchen ELIQUIS 5 MG TABS tablet TAKE ONE TABLET BY MOUTH TWICE DAILY. 180 tablet 3  . fluticasone (FLONASE) 50 MCG/ACT nasal spray Place 2 sprays into both nostrils 2 (two) times daily as needed for allergies.     . furosemide (LASIX) 20 MG tablet Take 20 mg by mouth daily as needed for fluid. Take in addition to the 40mg  if needed for swelling    . furosemide (LASIX) 40 MG tablet TAKE ONE TABLET BY MOUTH DAILY. 90 tablet 3  . gabapentin (NEURONTIN) 300 MG capsule Take 1 capsule (300 mg total) by mouth 3 (three) times daily. 90 capsule 2  . hydrocortisone (ANUSOL-HC) 25 MG suppository Place 1 suppository (25 mg total)  rectally every 12 (twelve) hours. 12 suppository 1  . ipratropium-albuterol (DUONEB) 0.5-2.5 (3) MG/3ML SOLN Take 3 mLs by nebulization every 6 (six) hours. 360 mL 0  . Magnesium Oxide 400 (240 Mg) MG TABS TAKE ONE TABLET BY MOUTH DAILY. 90 tablet 3  . methocarbamol (ROBAXIN) 500 MG tablet Take 500 mg by mouth every 6 (six) hours as needed for muscle spasms.     . metoprolol (TOPROL-XL) 200 MG 24 hr tablet TAKE ONE TABLET BY MOUTH DAILY. 30 tablet 1  . Multiple Vitamin (MULTIVITAMIN WITH MINERALS) TABS tablet Take 1 tablet by mouth daily with  lunch. Centrum    . nitroGLYCERIN (NITROSTAT) 0.4 MG SL tablet Place 0.4 mg under the tongue every 5 (five) minutes as needed for chest pain. Reported on 08/04/2015    . oxyCODONE (ROXICODONE) 5 MG immediate release tablet Take one tablet by mouth twice daily as needed for pain 60 tablet 0  . pantoprazole (PROTONIX) 40 MG tablet Take 40 mg by mouth daily.     . potassium chloride (K-DUR,KLOR-CON) 10 MEQ tablet TAKE TWO (2) TABLETS BY MOUTH DAILY. 180 tablet 3  . allopurinol (ZYLOPRIM) 100 MG tablet Take 1 tablet (100 mg total) daily by mouth. 30 tablet 0  . isosorbide mononitrate (IMDUR) 30 MG 24 hr tablet Take 1 tablet (30 mg total) by mouth daily. 30 tablet 6   No current facility-administered medications for this visit.     Past Medical History:  Diagnosis Date  . Anxiety   . Arthritis   . Atrial fibrillation ([place])   . Bursitis    Left shoulder  . Cataract   . COPD (chronic obstructive pulmonary disease) ([place])   . Coronary atherosclerosis of native coronary artery    a. DES to CX, moderately severe stenosis RCA, mild stenosis LAD 04/2013  . Depression   . Diastolic heart failure ([facility])    EF 55-60%  . Dysphagia, unspecified(787.20)   . Essential hypertension   . GERD (gastroesophageal reflux disease)    Hx Schatzki's ring, multiple EGD/ED last 01/06/2004  . Gout   . Headache   . History of anemia   . Hyperlipidemia   . Internal hemorrhoids without mention of complication   . MI (myocardial infarction) ([place]) 2006  . Microscopic colitis 2003  . OSA (obstructive sleep apnea)   . Panic disorder without agoraphobia   . Paresthesia   . Pneumonia 12/2011  . PVD (peripheral vascular disease) ([place])   . S/P colonoscopy 09/27/2001   internal hemorrhoids, desc colon inflam polyp, SB BX-chronic duodenitis, colitis  . Thyroid disease     Past Surgical History:  Procedure Laterality Date  . ABDOMINAL HYSTERECTOMY    . ABDOMINAL HYSTERECTOMY    . ANTERIOR AND POSTERIOR REPAIR     with  resection of vagina  . ANTERIOR LAT LUMBAR FUSION N/A 08/01/2016   Procedure: Lumbar Two-Lumbar Five Transpsoas lateral interbody fusion with Lumbar Two-Three lateral plate fixation;  Surgeon: Kevan Ny Ditty, MD;  Location: [facility];  Service: Neurosurgery;  Laterality: N/A;  L2-5 Transpsoas lateral interbody fusion with L2-3 lateral plate fixation  . APPENDECTOMY    . BACK SURGERY    . BIOPSY  07/05/2015   Procedure: BIOPSY;  Surgeon: Daneil Dolin, MD;  Location: [facility];  Service: Endoscopy;;  gastric polyp biopsy, ascending colon biopsy  . BLADDER SUSPENSION  11/09/2011   Procedure: TRANSVAGINAL TAPE (TVT) PROCEDURE;  Surgeon: Marissa Nestle, MD;  Location: [facility];  Service: Urology;  Laterality: N/A;  . bladder tack  06/2010  . BREAST LUMPECTOMY  1998   left, benign  . CARDIAC CATHETERIZATION    . CARDIAC CATHETERIZATION    . CARDIAC CATHETERIZATION N/A 12/16/2015   Procedure: Left Heart Cath and Coronary Angiography;  Surgeon: Troy Sine, MD;  Location: [facility];  Service: Cardiovascular;  Laterality: N/A;  . [place]   left  . CHOLECYSTECTOMY  1998  . Cholecystectomy    . COLONOSCOPY  03/16/2011   multiple hyperplastic colon polyps, sigmoid diverticulosis, melanosis coli  . COLONOSCOPY WITH PROPOFOL N/A 07/05/2015   RMR:one 5 mm polyp in descending colon  . CORONARY ANGIOPLASTY WITH STENT PLACEMENT    . ESOPHAGEAL DILATION N/A 07/05/2015   Procedure: ESOPHAGEAL DILATION;  Surgeon: Daneil Dolin, MD;  [facility];  Service: Endoscopy;  Laterality: N/A;  . ESOPHAGOGASTRODUODENOSCOPY (EGD) WITH PROPOFOL N/A 07/05/2015   AYT:KZSWFU  . JOINT REPLACEMENT Right 2007   right knee  . left hand surgery    . LEFT HEART CATHETERIZATION WITH CORONARY ANGIOGRAM N/A 05/14/2013   Procedure: LEFT HEART CATHETERIZATION WITH CORONARY ANGIOGRAM;  Surgeon: Blane Ohara, MD;  Location: [facility];  Service: Cardiovascular;  Laterality: N/A;  .  left rotator cuff surgery    . LUMBAR LAMINECTOMY/DECOMPRESSION MICRODISCECTOMY N/A 10/11/2012   Procedure: LUMBAR LAMINECTOMY/DECOMPRESSION MICRODISCECTOMY 2 LEVELS;  Surgeon: Floyce Stakes, MD;  Location: [facility];  Service: Neurosurgery;  Laterality: N/A;  L3-4 L4-5 Laminectomy  . LUMBAR WOUND DEBRIDEMENT N/A 09/27/2015   Procedure: Exploration of Lumbar Wound w/ Repair CSF Leak/Lumbar Drain Placement;  Surgeon: Leeroy Cha, MD;  Location: [facility];  Service: Neurosurgery;  Laterality: N/A;  . MALONEY DILATION  03/16/2011   Gastritis. No H.pylori on bx. 67F maloney dilation with disruption of  occult cevical esophageal web  . NASAL SINUS SURGERY    . right knee replacement  2007  . right leg benign tumor    . SHOULDER SURGERY Left   . TONSILLECTOMY    . unspecified area, hysterectomy  1972   partial    Social History   Socioeconomic History  . Marital status: Divorced    Spouse name: Not on file  . Number of children: 5  . Years of education: Not on file  . Highest education level: Not on file  Social Needs  . Financial resource strain: Not on file  . Food insecurity - worry: Not on file  . Food insecurity - inability: Not on file  . Transportation needs - medical: Not on file  . Transportation needs - non-medical: Not on file  Occupational History  . Occupation: retired  Tobacco Use  . Smoking status: Former Smoker    Packs/day: 1.00    Years: 64.00    Pack years: 64.00    Types: Cigarettes    Start date: 12/24/1947    Last attempt to quit: 11/17/2001    Years since quitting: 15.5  . Smokeless tobacco: Never Used  . Tobacco comment: Quit smoking in 2003  Substance and Sexual Activity  . Alcohol use: No    Alcohol/week: 0.0 oz  . Drug use: No  . Sexual activity: No  Other Topics Concern  . Not on file  Social History Narrative   Divorced.   Sister had colon perforation & died from complications in [place], [place]     Vitals:   06/14/17 1253  BP: 128/74    Pulse: 94  SpO2:  96%  Weight: 151 lb (68.5 kg)  Height: 5\' 1"  (1.549 m)    Wt Readings from Last 3 Encounters:  06/14/17 151 lb (68.5 kg)  05/08/17 157 lb (71.2 kg)  04/24/17 154 lb (69.9 kg)     PHYSICAL EXAM General: NAD HEENT: Normal. Neck: No JVD, no thyromegaly. Lungs: Clear to auscultation bilaterally with normal respiratory effort. CV: Regular rate and irregular rhythm, normal S1/S2, no S3, no murmur. No pretibial or periankle edema. Abdomen: Soft, nontender, no distention.  Neurologic: Alert and oriented.  Psych: Normal affect. Skin: Normal. Musculoskeletal: Erythema of right big toe.    ECG: Most recent ECG reviewed.   Labs: Lab Results  Component Value Date/Time   K 3.9 05/11/2017 01:20 PM   BUN 14 05/11/2017 01:20 PM   CREATININE 1.12 (H) 05/11/2017 01:20 PM   CREATININE 1.17 (H) 02/13/2011 03:26 PM   ALT 28 05/11/2017 01:16 PM   TSH 5.881 (H) 02/19/2017 07:36 PM   TSH 2.770 11/25/2013 02:14 PM   HGB 10.5 (L) 02/21/2017 04:10 AM     Lipids: Lab Results  Component Value Date/Time   LDLCALC 103 (H) 12/16/2015 04:35 AM   CHOL 177 12/16/2015 04:35 AM   TRIG 197 (H) 12/16/2015 04:35 AM   HDL 35 (L) 12/16/2015 04:35 AM       ASSESSMENT AND PLAN:  1. CAD with DES to LCx with angina (chest pain and exertional dyspnea):  I will increase Toprol-XL to 225 mg daily given resting heart rate in the 90 bpm range.  I will also increase Imdur to 60 mg daily.  I will obtain a Lexiscan Myoview to assess for ischemia primarily in the circumflex distribution. Statin intolerant. She is no longer on aspirin.  She is on Eliquis for atrial fibrillation.  2. Essential HTN: Controlled. I will monitor given increase in Toprol-XL and Imdur.  3.  Acute on chronic combined systolic and diastolic heart failure, EF 30-35% (previously 55%): Her weight is down 6 pounds since her last visit with me on 05/08/17.  Continue Lasix 40 mg daily. Cardiomyopathy is possibly  tachycardia-mediated.   4. Hyperlipidemia: Statin intolerant.  5. Aortic regurgitation: Will monitor clinically and with surveillance echocardiograms. Mild at present.  6. Atrial fibrillation with exertional dyspnea:Heart rate is in the mid 90 bpm range onToprol-XL 200 mg daily.  I will increase to 225 mg daily.  Continue Eliquis.    Disposition: Follow up 2-3 weeks.   Kate Sable, M.D., F.A.C.C.

## 2017-06-19 DIAGNOSIS — Z1231 Encounter for screening mammogram for malignant neoplasm of breast: Secondary | ICD-10-CM | POA: Diagnosis not present

## 2017-06-22 ENCOUNTER — Encounter (HOSPITAL_COMMUNITY): Payer: Medicare Other

## 2017-06-22 ENCOUNTER — Telehealth: Payer: Self-pay | Admitting: Cardiology

## 2017-06-22 ENCOUNTER — Ambulatory Visit (HOSPITAL_COMMUNITY): Payer: Medicare Other

## 2017-06-22 NOTE — Telephone Encounter (Signed)
error 

## 2017-06-25 ENCOUNTER — Ambulatory Visit: Payer: Medicare Other | Admitting: Neurology

## 2017-07-02 ENCOUNTER — Encounter (HOSPITAL_COMMUNITY)
Admission: RE | Admit: 2017-07-02 | Discharge: 2017-07-02 | Disposition: A | Discharge status: Home / Self Care | Payer: Medicare Other | Source: Ambulatory Visit | Attending: Cardiovascular Disease | Admitting: Cardiovascular Disease

## 2017-07-02 ENCOUNTER — Encounter (HOSPITAL_COMMUNITY): Payer: Self-pay

## 2017-07-02 ENCOUNTER — Other Ambulatory Visit: Payer: Self-pay

## 2017-07-02 ENCOUNTER — Encounter (HOSPITAL_BASED_OUTPATIENT_CLINIC_OR_DEPARTMENT_OTHER)
Admission: RE | Admit: 2017-07-02 | Discharge: 2017-07-02 | Disposition: A | Discharge status: Home / Self Care | Payer: Medicare Other | Source: Ambulatory Visit | Attending: Cardiovascular Disease | Admitting: Cardiovascular Disease

## 2017-07-02 DIAGNOSIS — R079 Chest pain, unspecified: Secondary | ICD-10-CM

## 2017-07-02 DIAGNOSIS — R945 Abnormal results of liver function studies: Principal | ICD-10-CM

## 2017-07-02 DIAGNOSIS — R7989 Other specified abnormal findings of blood chemistry: Secondary | ICD-10-CM

## 2017-07-02 LAB — NM MYOCAR MULTI W/SPECT W/WALL MOTION / EF
CHL CUP NUCLEAR SRS: 1
CHL CUP NUCLEAR SSS: 2
CHL CUP RESTING HR STRESS: 78 {beats}/min
LHR: 0.44
LV sys vol: 21 mL
LVDIAVOL: 54 mL (ref 46–106)
NUC STRESS TID: 0.93
Peak HR: 107 {beats}/min
SDS: 1

## 2017-07-02 MED ORDER — SODIUM CHLORIDE 0.9% FLUSH
INTRAVENOUS | Status: AC
  Administered 2017-07-02: 10 mL via INTRAVENOUS
  Filled 2017-07-02: qty 10

## 2017-07-02 MED ORDER — REGADENOSON 0.4 MG/5ML IV SOLN
INTRAVENOUS | Status: AC
  Administered 2017-07-02: 0.4 mg via INTRAVENOUS
  Filled 2017-07-02: qty 5

## 2017-07-02 MED ORDER — TECHNETIUM TC 99M TETROFOSMIN IV KIT
10.0000 | PACK | Freq: Once | INTRAVENOUS | Status: AC | PRN
  Administered 2017-07-02: 10 via INTRAVENOUS

## 2017-07-02 MED ORDER — SODIUM CHLORIDE 0.9% FLUSH
INTRAVENOUS | Status: AC
  Filled 2017-07-02: qty 180

## 2017-07-02 MED ORDER — TECHNETIUM TC 99M TETROFOSMIN IV KIT
30.0000 | PACK | Freq: Once | INTRAVENOUS | Status: AC | PRN
Start: 2017-07-02 — End: 2017-07-02
  Administered 2017-07-02: 30 via INTRAVENOUS

## 2017-07-04 NOTE — Progress Notes (Signed)
Cardiology Office Note    Date:  07/05/2017   ID:  [name], DOB 12/04/1932, MRN [id number]  PCP:  Rosalee Kaufman, PA-C  Cardiologist: Kate Sable, MD    Chief Complaint  Patient presents with  . Follow-up    3 week visit    History of Present Illness:    [name] is a 84 y.o. female with past medical history of CAD (s/p DES to LCx in 04/2013, cath in 11/2015 showing patent stent with 20% prox-LAD and 80% ostial RCA stenosis for which medical management was recommended due to small artery size), chronic combined systolic and diastolic CHF (EF 13-24% by echo in 04/2016), PAF (on Eliquis), HTN, and HLD who presents to the office today for 3-week follow-up.  She was recently examined by Dr. Bronson Ing on 06/14/2017 and reported having episodes of dyspnea with exertion. Weight was actually down 6 pounds since her last office visit and at 151 lbs. She was therefore continued on Lasix 40 mg daily. Toprol-XL was titrated to 225 mg daily as her resting heart rate was in the 90's.  With her worsening dyspnea, a nuclear stress test was recommended for further evaluation. This was performed on 07/02/2017 and showed normal perfusion with no evidence of ischemia and was overall a low risk study.  In talking with the patient today, she reports having episodes of a stabbing pain along her right upper quadrant and under her right breast which occurs mostly with deep breathing. She is s/p cholecystectomy. Pain is not exacerbated or influenced with food consumption and no association with exertion. She is tender to palpation. Does mention falling a few weeks back and is unsure if she landed on that side.  She denies any repeat episodes of chest pain on exertion. No recent dyspnea, orthopnea, PND, or lower extremity edema. She remains on Eliquis for anticoagulation and denies any evidence of active bleeding.   Past Medical History:  Diagnosis Date  . Anxiety   . Arthritis   .  Atrial fibrillation ([place])   . Bursitis    Left shoulder  . Cataract   . COPD (chronic obstructive pulmonary disease) ([place])   . Coronary atherosclerosis of native coronary artery    a. s/p DES to LCx in 04/2013 b. cath in 11/2015 showing patent stent with 20% prox-LAD and 80% ostial RCA stenosis for which medical management was recommended due to small artery size  . Depression   . Diastolic heart failure (HCC)    EF 55-60%  . Dysphagia, unspecified(787.20)   . Essential hypertension   . GERD (gastroesophageal reflux disease)    Hx Schatzki's ring, multiple EGD/ED last 01/06/2004  . Gout   . Headache   . History of anemia   . Hyperlipidemia   . Internal hemorrhoids without mention of complication   . MI (myocardial infarction) ([place]) 2006  . Microscopic colitis 2003  . OSA (obstructive sleep apnea)   . Panic disorder without agoraphobia   . Paresthesia   . Pneumonia 12/2011  . PVD (peripheral vascular disease) ([place])   . S/P colonoscopy 09/27/2001   internal hemorrhoids, desc colon inflam polyp, SB BX-chronic duodenitis, colitis  . Thyroid disease     Past Surgical History:  Procedure Laterality Date  . ABDOMINAL HYSTERECTOMY    . ABDOMINAL HYSTERECTOMY    . ANTERIOR AND POSTERIOR REPAIR     with resection of vagina  . ANTERIOR LAT LUMBAR FUSION N/A 08/01/2016   Procedure: Lumbar Two-Lumbar Five Transpsoas  lateral interbody fusion with Lumbar Two-Three lateral plate fixation;  Surgeon: Kevan Ny Ditty, MD;  Location: [facility];  Service: Neurosurgery;  Laterality: N/A;  L2-5 Transpsoas lateral interbody fusion with L2-3 lateral plate fixation  . APPENDECTOMY    . BACK SURGERY    . BIOPSY  07/05/2015   Procedure: BIOPSY;  Surgeon: Daneil Dolin, MD;  [facility];  Service: Endoscopy;;  gastric polyp biopsy, ascending colon biopsy  . BLADDER SUSPENSION  11/09/2011   Procedure: TRANSVAGINAL TAPE (TVT) PROCEDURE;  Surgeon: Marissa Nestle, MD;  Location: [facility];   Service: Urology;  Laterality: N/A;  . bladder tack  06/2010  . BREAST LUMPECTOMY  1998   left, benign  . CARDIAC CATHETERIZATION    . CARDIAC CATHETERIZATION    . CARDIAC CATHETERIZATION N/A 12/16/2015   Procedure: Left Heart Cath and Coronary Angiography;  Surgeon: Troy Sine, MD;  Location: [facility];  Service: Cardiovascular;  Laterality: N/A;  . [place]   left  . CHOLECYSTECTOMY  1998  . Cholecystectomy    . COLONOSCOPY  03/16/2011   multiple hyperplastic colon polyps, sigmoid diverticulosis, melanosis coli  . COLONOSCOPY WITH PROPOFOL N/A 07/05/2015   RMR:one 5 mm polyp in descending colon  . CORONARY ANGIOPLASTY WITH STENT PLACEMENT    . ESOPHAGEAL DILATION N/A 07/05/2015   Procedure: ESOPHAGEAL DILATION;  Surgeon: Daneil Dolin, MD;  [facility];  Service: Endoscopy;  Laterality: N/A;  . ESOPHAGOGASTRODUODENOSCOPY (EGD) WITH PROPOFOL N/A 07/05/2015   OAC:ZYSAYT  . JOINT REPLACEMENT Right 2007   right knee  . left hand surgery    . LEFT HEART CATHETERIZATION WITH CORONARY ANGIOGRAM N/A 05/14/2013   Procedure: LEFT HEART CATHETERIZATION WITH CORONARY ANGIOGRAM;  Surgeon: Blane Ohara, MD;  [facility] CATH LAB;  Service: Cardiovascular;  Laterality: N/A;  . left rotator cuff surgery    . LUMBAR LAMINECTOMY/DECOMPRESSION MICRODISCECTOMY N/A 10/11/2012   Procedure: LUMBAR LAMINECTOMY/DECOMPRESSION MICRODISCECTOMY 2 LEVELS;  Surgeon: Floyce Stakes, MD;  Location: [facility];  Service: Neurosurgery;  Laterality: N/A;  L3-4 L4-5 Laminectomy  . LUMBAR WOUND DEBRIDEMENT N/A 09/27/2015   Procedure: Exploration of Lumbar Wound w/ Repair CSF Leak/Lumbar Drain Placement;  Surgeon: Leeroy Cha, MD;  Location: [facility];  Service: Neurosurgery;  Laterality: N/A;  . MALONEY DILATION  03/16/2011   Gastritis. No H.pylori on bx. 58F maloney dilation with disruption of  occult cevical esophageal web  . NASAL SINUS SURGERY    . right knee  replacement  2007  . right leg benign tumor    . SHOULDER SURGERY Left   . TONSILLECTOMY    . unspecified area, hysterectomy  1972   partial    Current Medications: Outpatient Medications Prior to Visit  Medication Sig Dispense Refill  . acetaminophen (TYLENOL) 500 MG tablet Take 500 mg by mouth daily as needed for headache.     . allopurinol (ZYLOPRIM) 100 MG tablet Take 1 tablet (100 mg total) daily by mouth. 30 tablet 0  . ALPRAZolam (XANAX) 0.25 MG tablet Take 0.25 mg by mouth 2 (two) times daily as needed.  0  . colchicine 0.6 MG tablet Take 1 tablet (0.6 mg total) daily by mouth. 30 tablet 0  . DULoxetine (CYMBALTA) 60 MG capsule Take 60 mg by mouth at bedtime.     [name] Kitchen ELIQUIS 5 MG TABS tablet TAKE ONE TABLET BY MOUTH TWICE DAILY. 180 tablet 3  . fluticasone (FLONASE) 50 MCG/ACT  nasal spray Place 2 sprays into both nostrils 2 (two) times daily as needed for allergies.     . furosemide (LASIX) 20 MG tablet Take 20 mg by mouth daily as needed for fluid. Take in addition to the 40mg  if needed for swelling    . furosemide (LASIX) 40 MG tablet TAKE ONE TABLET BY MOUTH DAILY. 90 tablet 3  . gabapentin (NEURONTIN) 300 MG capsule Take 1 capsule (300 mg total) by mouth 3 (three) times daily. 90 capsule 2  . hydrocortisone (ANUSOL-HC) 25 MG suppository Place 1 suppository (25 mg total) rectally every 12 (twelve) hours. 12 suppository 1  . ipratropium-albuterol (DUONEB) 0.5-2.5 (3) MG/3ML SOLN Take 3 mLs by nebulization every 6 (six) hours. 360 mL 0  . isosorbide mononitrate (IMDUR) 60 MG 24 hr tablet Take 1 tablet (60 mg total) by mouth daily. 90 tablet 3  . Magnesium Oxide 400 (240 Mg) MG TABS TAKE ONE TABLET BY MOUTH DAILY. 90 tablet 3  . methocarbamol (ROBAXIN) 500 MG tablet Take 500 mg by mouth every 6 (six) hours as needed for muscle spasms.     . metoprolol (TOPROL-XL) 200 MG 24 hr tablet TAKE ONE TABLET BY MOUTH DAILY. 30 tablet 1  . metoprolol succinate (TOPROL XL) 25 MG 24 hr tablet  Take 1 tablet (25 mg total) by mouth daily. 90 tablet 3  . Multiple Vitamin (MULTIVITAMIN WITH MINERALS) TABS tablet Take 1 tablet by mouth daily with lunch. Centrum    . nitroGLYCERIN (NITROSTAT) 0.4 MG SL tablet Place 0.4 mg under the tongue every 5 (five) minutes as needed for chest pain. Reported on 08/04/2015    . oxyCODONE (ROXICODONE) 5 MG immediate release tablet Take one tablet by mouth twice daily as needed for pain 60 tablet 0  . pantoprazole (PROTONIX) 40 MG tablet Take 40 mg by mouth daily.     . potassium chloride (K-DUR,KLOR-CON) 10 MEQ tablet TAKE TWO (2) TABLETS BY MOUTH DAILY. 180 tablet 3   No facility-administered medications prior to visit.      Allergies:   Cephalosporins; Levaquin [levofloxacin in d5w]; Macrodantin [nitrofurantoin macrocrystal]; Phenothiazines; Polysorbate; Prednisone; Buspirone; Cardura [doxazosin mesylate]; Codeine; Acyclovir and related; Prochlorperazine; Ranexa [ranolazine]; Atorvastatin; Ofloxacin; Other; Penicillins; and Pimozide   Social History   Socioeconomic History  . Marital status: Divorced    Spouse name: Not on file  . Number of children: 5  . Years of education: Not on file  . Highest education level: Not on file  Occupational History  . Occupation: retired  Scientific laboratory technician  . Financial resource strain: Not on file  . Food insecurity:    Worry: Not on file    Inability: Not on file  . Transportation needs:    Medical: Not on file    Non-medical: Not on file  Tobacco Use  . Smoking status: Former Smoker    Packs/day: 1.00    Years: 64.00    Pack years: 64.00    Types: Cigarettes    Start date: 12/24/1947    Last attempt to quit: 11/17/2001    Years since quitting: 15.6  . Smokeless tobacco: Never Used  . Tobacco comment: Quit smoking in 2003  Substance and Sexual Activity  . Alcohol use: No    Alcohol/week: 0.0 oz  . Drug use: No  . Sexual activity: Never  Lifestyle  . Physical activity:    Days per week: Not on file     Minutes per session: Not on file  . Stress: Not on  file  Relationships  . Social connections:    Talks on phone: Not on file    Gets together: Not on file    Attends religious service: Not on file    Active member of club or organization: Not on file    Attends meetings of clubs or organizations: Not on file    Relationship status: Not on file  Other Topics Concern  . Not on file  Social History Narrative   Divorced.   Sister had colon perforation & died from complications in [place], [place]     Family History:  The patient's family history includes Cancer in her other; Coronary artery disease in her other; Diabetes in her brother and son; Heart disease in her son; Hypertension in her other; Parkinson's disease in her father; Stroke in her mother and other; Stroke (age of onset: 74) in her daughter.   Review of Systems:   Please see the history of present illness.     General:  No chills, fever, night sweats or weight changes.  Cardiovascular:  No dyspnea on exertion, edema, orthopnea, palpitations, paroxysmal nocturnal dyspnea. Positive for right-sided chest pain.  Dermatological: No rash, lesions/masses Respiratory: No cough, dyspnea Urologic: No hematuria, dysuria Abdominal:   No nausea, vomiting, diarrhea, bright red blood per rectum, melena, or hematemesis Neurologic:  No visual changes, wkns, changes in mental status. All other systems reviewed and are otherwise negative except as noted above.   Physical Exam:    VS:  BP (!) 144/74 (BP Location: Left Arm)   Pulse 92   Ht 5\' 1"  (1.549 m)   Wt 158 lb (71.7 kg)   SpO2 97%   BMI 29.85 kg/m    General: Well developed, well nourished Caucasian female appearing in no acute distress. Head: Normocephalic, atraumatic, sclera non-icteric, no xanthomas, nares are without discharge.  Neck: No carotid bruits. JVD not elevated.  Lungs: Respirations regular and unlabored, without wheezes or rales.  Heart: Irregularly irregular. No S3 or  S4.  No murmur, no rubs, or gallops appreciated. Abdomen: Soft, non-tender, non-distended with normoactive bowel sounds. No hepatomegaly. No rebound/guarding. No obvious abdominal masses. Tender to palpation along RUQ.  Msk:  Strength and tone appear normal for age. No joint deformities or effusions. Extremities: No clubbing or cyanosis. No lower extremity edema.  Distal pedal pulses are 2+ bilaterally. Neuro: Alert and oriented X 3. Moves all extremities spontaneously. No focal deficits noted. Psych:  Responds to questions appropriately with a normal affect. Skin: No rashes or lesions noted  Wt Readings from Last 3 Encounters:  07/05/17 158 lb (71.7 kg)  06/14/17 151 lb (68.5 kg)  05/08/17 157 lb (71.2 kg)     Studies/Labs Reviewed:   EKG:  EKG is not ordered today.    Recent Labs: 02/17/2017: B Natriuretic Peptide 589.0 02/19/2017: TSH 5.881 02/20/2017: Magnesium 1.8 02/21/2017: Hemoglobin 10.5; Platelets 421 05/11/2017: ALT 28; BUN 14; Creatinine, Ser 1.12; Potassium 3.9; Sodium 138   Lipid Panel    Component Value Date/Time   CHOL 177 12/16/2015 0435   TRIG 197 (H) 12/16/2015 0435   HDL 35 (L) 12/16/2015 0435   CHOLHDL 5.1 12/16/2015 0435   VLDL 39 12/16/2015 0435   LDLCALC 103 (H) 12/16/2015 0435    Additional studies/ records that were reviewed today include:   Echocardiogram: 04/2016 Study Conclusions  - Left ventricle: The cavity size was normal. There was mild   concentric hypertrophy. Systolic function was moderately to   severely reduced. The estimated ejection fraction  was in the   range of 30% to 35%. Wall motion was normal; there were no   regional wall motion abnormalities. - Aortic valve: Mildly calcified annulus. Trileaflet; normal   thickness, mildly calcified leaflets. There was mild   regurgitation. - Mitral valve: There was moderate regurgitation. - Left atrium: The atrium was moderately dilated. - Pulmonary arteries: Systolic pressure was mildly  increased. PA   peak pressure: 38 mm Hg (S). - Pericardium, extracardiac: A small pericardial effusion was   identified.  NST: 06/2017  Normal perfusion No ischemia  This is a low risk study.  Nuclear stress EF: 60%.  Assessment:    1. Coronary artery disease involving native coronary artery of native heart with other form of angina pectoris ([place])   2. Pleuritic chest pain   3. Chronic combined systolic and diastolic CHF (congestive heart failure) ([place])   4. Chronic atrial fibrillation (HCC)   5. Essential hypertension      Plan:   In order of problems listed above:  1. CAD/ Pleuritic Chest Pain - s/p DES to LCx in 04/2013 with catheterization in 11/2015 showing patent stent with 20% prox-LAD and 80% ostial RCA stenosis for which medical management was recommended due to small artery size.  - recent NST showed normal perfusion with no evidence of ischemia and was overall a low risk study. - she does report having a stabbing pain along her right upper quadrant and under her right breast which occurs mostly with deep breathing. She is s/p cholecystectomy and pain is not influenced with food consumption and no association with exertion. She did fall a few weeks ago, which raises the possibility of a rib fracture. Will check CXR today.  - continue BB and Imdur. Intolerant to statins. No ASA secondary to the need for Eliquis.   2. Chronic Combined Systolic and Diastolic CHF - The patient has known reduced EF of 30-35% by echo in 04/2016. She denies any recent dyspnea on exertion, orthopnea, or lower extremity edema. Reports weight has been stable at 154-155 lbs on her home scales. Continue Lasix 40mg  daily. She knows to take an additional 20 mg if weight increases by greater than 3 pounds overnight. - remains on Toprol-XL. Unclear as to why she is not on ARB therapy. Would readdress at follow-up.   3. Persistent Atrial Fibrillation - Patient's heart rate is in the 90's during today's  visit. This improved into the 80's after sitting. Will continue Toprol-XL at current dosing of 225mg  daily. Would avoid Cardizem in the setting of her reduced EF.  - She denies any evidence of active bleeding. Remains on Eliquis for anticoagulation.  4. HTN - BP is slightly elevated at 144/74 during today's visit. She reports this is overall well-controlled at home. I have asked her to continue to follow this. We will continue current medication regimen at this time. Plan for addition of ARB if BP remains elevated.     Medication Adjustments/Labs and Tests Ordered: Current medicines are reviewed at length with the patient today.  Concerns regarding medicines are outlined above.  Medication changes, Labs and Tests ordered today are listed in the Patient Instructions below. Patient Instructions  Medication Instructions:  Your physician recommends that you continue on your current medications as directed. Please refer to the Current Medication list given to you today.  Labwork: NONE  Testing/Procedures: A chest x-ray takes a picture of the organs and structures inside the chest, including the heart, lungs, and blood vessels. This test can  show several things, including, whether the heart is enlarges; whether fluid is building up in the lungs; and whether pacemaker / defibrillator leads are still in place.  Follow-Up: Your physician recommends that you schedule a follow-up appointment in: 2 MONTHS   Any Other Special Instructions Will Be Listed Below (If Applicable).  If you need a refill on your cardiac medications before your next appointment, please call your pharmacy.    Signed, Erma Heritage, PA-C  07/05/2017 5:27 PM    [facility] [address] [place], [place] [address] Phone: [phone]

## 2017-07-05 ENCOUNTER — Encounter: Payer: Self-pay | Admitting: Student

## 2017-07-05 ENCOUNTER — Ambulatory Visit (INDEPENDENT_AMBULATORY_CARE_PROVIDER_SITE_OTHER): Payer: Medicare Other | Admitting: Student

## 2017-07-05 ENCOUNTER — Ambulatory Visit (HOSPITAL_COMMUNITY)
Admission: RE | Admit: 2017-07-05 | Discharge: 2017-07-05 | Disposition: A | Discharge status: Home / Self Care | Payer: Medicare Other | Source: Ambulatory Visit | Attending: Student | Admitting: Student

## 2017-07-05 VITALS — BP 144/74 | HR 92 | Ht 61.0 in | Wt 158.0 lb

## 2017-07-05 DIAGNOSIS — I5042 Chronic combined systolic (congestive) and diastolic (congestive) heart failure: Secondary | ICD-10-CM | POA: Insufficient documentation

## 2017-07-05 DIAGNOSIS — I482 Chronic atrial fibrillation, unspecified: Secondary | ICD-10-CM

## 2017-07-05 DIAGNOSIS — R0781 Pleurodynia: Secondary | ICD-10-CM | POA: Diagnosis not present

## 2017-07-05 DIAGNOSIS — R600 Localized edema: Secondary | ICD-10-CM | POA: Diagnosis not present

## 2017-07-05 DIAGNOSIS — I517 Cardiomegaly: Secondary | ICD-10-CM | POA: Diagnosis not present

## 2017-07-05 DIAGNOSIS — I25118 Atherosclerotic heart disease of native coronary artery with other forms of angina pectoris: Secondary | ICD-10-CM | POA: Diagnosis not present

## 2017-07-05 DIAGNOSIS — R079 Chest pain, unspecified: Secondary | ICD-10-CM | POA: Diagnosis not present

## 2017-07-05 DIAGNOSIS — I10 Essential (primary) hypertension: Secondary | ICD-10-CM | POA: Diagnosis not present

## 2017-07-05 NOTE — Patient Instructions (Signed)
Medication Instructions:  Your physician recommends that you continue on your current medications as directed. Please refer to the Current Medication list given to you today.   Labwork: NONE  Testing/Procedures: A chest x-ray takes a picture of the organs and structures inside the chest, including the heart, lungs, and blood vessels. This test can show several things, including, whether the heart is enlarges; whether fluid is building up in the lungs; and whether pacemaker / defibrillator leads are still in place.   Follow-Up: Your physician recommends that you schedule a follow-up appointment in: 2 MONTHS    Any Other Special Instructions Will Be Listed Below (If Applicable).     If you need a refill on your cardiac medications before your next appointment, please call your pharmacy.

## 2017-07-09 ENCOUNTER — Ambulatory Visit: Payer: Medicare Other | Admitting: Neurology

## 2017-07-19 ENCOUNTER — Other Ambulatory Visit: Payer: Self-pay

## 2017-07-19 MED ORDER — METOPROLOL SUCCINATE ER 200 MG PO TB24: 200.0000 mg | ORAL_TABLET | Freq: Every day | ORAL | 1 refills | Status: DC

## 2017-07-19 NOTE — Telephone Encounter (Signed)
Refilled toprol xl 200 mg # 90 to [facility]

## 2017-08-16 DIAGNOSIS — M5416 Radiculopathy, lumbar region: Secondary | ICD-10-CM | POA: Diagnosis not present

## 2017-08-16 DIAGNOSIS — M4316 Spondylolisthesis, lumbar region: Secondary | ICD-10-CM | POA: Diagnosis not present

## 2017-08-16 DIAGNOSIS — I10 Essential (primary) hypertension: Secondary | ICD-10-CM | POA: Diagnosis not present

## 2017-08-16 DIAGNOSIS — Z6829 Body mass index (BMI) 29.0-29.9, adult: Secondary | ICD-10-CM | POA: Diagnosis not present

## 2017-08-21 DIAGNOSIS — M545 Low back pain: Secondary | ICD-10-CM | POA: Diagnosis not present

## 2017-08-21 DIAGNOSIS — I482 Chronic atrial fibrillation: Secondary | ICD-10-CM | POA: Diagnosis not present

## 2017-08-21 DIAGNOSIS — M109 Gout, unspecified: Secondary | ICD-10-CM | POA: Diagnosis not present

## 2017-08-21 DIAGNOSIS — R251 Tremor, unspecified: Secondary | ICD-10-CM | POA: Diagnosis not present

## 2017-08-21 DIAGNOSIS — J440 Chronic obstructive pulmonary disease with acute lower respiratory infection: Secondary | ICD-10-CM | POA: Diagnosis not present

## 2017-08-21 DIAGNOSIS — Z6828 Body mass index (BMI) 28.0-28.9, adult: Secondary | ICD-10-CM | POA: Diagnosis not present

## 2017-08-21 DIAGNOSIS — I10 Essential (primary) hypertension: Secondary | ICD-10-CM | POA: Diagnosis not present

## 2017-08-21 DIAGNOSIS — N183 Chronic kidney disease, stage 3 (moderate): Secondary | ICD-10-CM | POA: Diagnosis not present

## 2017-08-21 DIAGNOSIS — F419 Anxiety disorder, unspecified: Secondary | ICD-10-CM | POA: Diagnosis not present

## 2017-08-21 DIAGNOSIS — I5033 Acute on chronic diastolic (congestive) heart failure: Secondary | ICD-10-CM | POA: Diagnosis not present

## 2017-08-21 DIAGNOSIS — K649 Unspecified hemorrhoids: Secondary | ICD-10-CM | POA: Diagnosis not present

## 2017-09-03 ENCOUNTER — Telehealth: Payer: Self-pay

## 2017-09-03 NOTE — Telephone Encounter (Signed)
Dr. Bronson Ing, this is a request to hold aspirin for lumbar myelogram/CT. If OK, for how long?  I will route this to pharmacy for input on holding Eliquis.   Please route response back to CV [facility] PREOP

## 2017-09-03 NOTE — Telephone Encounter (Signed)
   [facility] Pre-operative Risk Assessment    Request for surgical clearance:  1. What type of surgery is being performed? Lumbar Myelogram/ CT   2. When is this surgery scheduled? TBD   3. What type of clearance is required (medical clearance vs. Pharmacy clearance to hold med vs. Both)? Both  4. Are there any medications that need to be held prior to surgery and how long? Hold Eliquid and ASA and how many days prior   5. Practice name and name of physician performing surgery? Dr. Kristeen Miss - [facility]   6. What is your office phone number. [place].   What is your office fax number [phone]  8.   Anesthesia type (None, local, MAC, general) ? Not Mentioned   Bobby Rumpf 09/03/2017, 9:00 AM  _________________________________________________________________   (provider comments below)

## 2017-09-04 NOTE — Telephone Encounter (Signed)
Patient with diagnosis of atrial fibrillation on Eliquis for anticoagulation.    Procedure: lumbar myelogram/CT Date of procedure: TBD  CHADS2-VASc score of  6 (CHF, HTN, AGE,, CAD, AGE, female)  CrCl 43.8 Platelet count 421  Per office protocol, patient can hold Eliquis for 3 days prior to procedure.    Patient should restart Eliquis 48-72 hours after procedure at discretion of procedure MD

## 2017-09-05 NOTE — Telephone Encounter (Signed)
   Primary Cardiologist:Suresh Bronson Ing, MD  Chart reviewed as part of pre-operative protocol coverage. Patient has worsening lower extremity edema since last office visit.  Pre-op covering staff: - Please schedule appointment and call patient to inform them. - Please contact requesting surgeon's office via preferred method (i.e, phone, fax) to inform them of need for appointment prior to surgery.  [place], [place]  09/05/2017, 2:46 PM

## 2017-09-05 NOTE — Telephone Encounter (Signed)
Patient has an appointment on 5/28 with Dr. Bronson Ing. Called [facility], spoke with Lorriane Shire and she is aware of up coming appointment.

## 2017-09-05 NOTE — Telephone Encounter (Signed)
Patient of Dr. Bronson Ing.  He is currently out of the office and this message was forwarded to me for review.  Appreciate pharmacist recommendation regarding Eliquis, would follow these instructions.  It does not appear that she is taking aspirin based on chart review.

## 2017-09-11 ENCOUNTER — Ambulatory Visit: Payer: Medicare Other | Admitting: Cardiovascular Disease

## 2017-09-18 ENCOUNTER — Encounter: Payer: Self-pay | Admitting: Cardiovascular Disease

## 2017-09-18 ENCOUNTER — Other Ambulatory Visit (HOSPITAL_COMMUNITY)
Admission: RE | Admit: 2017-09-18 | Discharge: 2017-09-18 | Disposition: A | Discharge status: Home / Self Care | Payer: Medicare Other | Source: Ambulatory Visit | Attending: Cardiovascular Disease | Admitting: Cardiovascular Disease

## 2017-09-18 ENCOUNTER — Ambulatory Visit (INDEPENDENT_AMBULATORY_CARE_PROVIDER_SITE_OTHER): Payer: Medicare Other | Admitting: Cardiovascular Disease

## 2017-09-18 VITALS — BP 116/64 | HR 95 | Ht 61.0 in | Wt 157.0 lb

## 2017-09-18 DIAGNOSIS — I482 Chronic atrial fibrillation: Secondary | ICD-10-CM | POA: Diagnosis not present

## 2017-09-18 DIAGNOSIS — R140 Abdominal distension (gaseous): Secondary | ICD-10-CM | POA: Insufficient documentation

## 2017-09-18 DIAGNOSIS — R0609 Other forms of dyspnea: Secondary | ICD-10-CM | POA: Insufficient documentation

## 2017-09-18 DIAGNOSIS — I10 Essential (primary) hypertension: Secondary | ICD-10-CM

## 2017-09-18 DIAGNOSIS — Z955 Presence of coronary angioplasty implant and graft: Secondary | ICD-10-CM | POA: Diagnosis not present

## 2017-09-18 DIAGNOSIS — I4821 Permanent atrial fibrillation: Secondary | ICD-10-CM

## 2017-09-18 DIAGNOSIS — I25118 Atherosclerotic heart disease of native coronary artery with other forms of angina pectoris: Secondary | ICD-10-CM

## 2017-09-18 LAB — BRAIN NATRIURETIC PEPTIDE: B NATRIURETIC PEPTIDE 5: 512 pg/mL — AB (ref 0.0–100.0)

## 2017-09-18 MED ORDER — LISINOPRIL 2.5 MG PO TABS: 2.5000 mg | ORAL_TABLET | Freq: Every day | ORAL | 3 refills | Status: DC

## 2017-09-18 MED ORDER — METOPROLOL SUCCINATE ER 50 MG PO TB24: 50.0000 mg | ORAL_TABLET | Freq: Every day | ORAL | 3 refills | Status: DC

## 2017-09-18 NOTE — Patient Instructions (Addendum)
Your physician wants you to follow-up in: 1 month  with Dr.Koneswaran     INCREASE Toprol XL to 250 mg daily (take 1- 200 mg tablet and 1- 50 mg tablet)   START Lisinopril 2.5 mg daily  INCREASE Lasix to 40 mg Twice a day for 4 days and then go back to regular dose  INCREASE your Potassium to 20 meq Twice a day for 4 days and then go back to regular dose      Get lab work now: BNP      No tests today      Thank you for choosing [facility] !

## 2017-09-18 NOTE — Progress Notes (Signed)
SUBJECTIVE: The patient presents for routine follow-up.  She called our office on 5/22 complaining of worsening lower extremity edema since her last office visit.  I reviewed the chest x-ray performed on 07/05/2017 which showed stable cardiomegaly and mild interstitial edema.  Weight is down 1 pound since 07/05/2017.  However, it is up 6 pounds from her office visit with me on 06/14/2017.  She underwent percutaneous coronary intervention with drug-eluting stent to the left circumflex coronary artery in January 2015.  Coronary angiography 12/16/15 showed 20% stenosis in the proximal LAD and in the inferior branch of the first diagonal with a widely patent previously placed circumflex stent. RCA was small and nondominant with 80% ostial stenosis. Medical therapy was recommended.  Nuclear stress test was normal on 07/02/2017.  She has been having progressive exertional dyspnea.  She said her abdomen feels much more bloated.  She was short of breath when walking from the parking lot to our office.  She has some occasional chest pains radiating from her back and also some left shoulder pains and left sided neck pains.  She denies leg swelling.   Review of Systems: As per "subjective", otherwise negative.  Allergies  Allergen Reactions  . Cephalosporins Diarrhea and Nausea Only    Lightheaded  . Levaquin [Levofloxacin In D5w] Swelling  . Macrodantin [Nitrofurantoin Macrocrystal] Swelling  . Phenothiazines Anaphylaxis and Hives  . Polysorbate Anaphylaxis  . Prednisone Shortness Of Breath  . Buspirone Itching  . Cardura [Doxazosin Mesylate] Itching  . Codeine Itching  . Acyclovir And Related Itching    Redness of skin  . Prochlorperazine Other (See Comments)    "Upset stomach"  . Ranexa [Ranolazine]     Severe drop in BP  . Atorvastatin Hives    Cramping; tolerates Crestor ok  . Ofloxacin Rash  . Other Itching and Rash    "WOOL"= make skin look like it has been burned  .  Penicillins Other (See Comments)    Causes redness all over. Has patient had a PCN reaction causing immediate rash, facial/tongue/throat swelling, SOB or lightheadedness with hypotension: No Has patient had a PCN reaction causing severe rash involving mucus membranes or skin necrosis: No Has patient had a PCN reaction that required hospitalization No Has patient had a PCN reaction occurring within the last 10 years: No If all of the above answers are "NO", then may proceed with Cephalosporin use.   . Pimozide Hives and Itching    Current Outpatient Medications  Medication Sig Dispense Refill  . acetaminophen (TYLENOL) 500 MG tablet Take 500 mg by mouth daily as needed for headache.     . allopurinol (ZYLOPRIM) 100 MG tablet Take 1 tablet (100 mg total) daily by mouth. 30 tablet 0  . ALPRAZolam (XANAX) 0.25 MG tablet Take 0.25 mg by mouth 2 (two) times daily as needed.  0  . DULoxetine (CYMBALTA) 60 MG capsule Take 60 mg by mouth at bedtime.     [name] Kitchen ELIQUIS 5 MG TABS tablet TAKE ONE TABLET BY MOUTH TWICE DAILY. 180 tablet 3  . fluticasone (FLONASE) 50 MCG/ACT nasal spray Place 2 sprays into both nostrils 2 (two) times daily as needed for allergies.     . furosemide (LASIX) 20 MG tablet Take 20 mg by mouth daily as needed for fluid. Take in addition to the 40mg  if needed for swelling    . furosemide (LASIX) 40 MG tablet TAKE ONE TABLET BY MOUTH DAILY. 90 tablet 3  . gabapentin (  NEURONTIN) 300 MG capsule Take 1 capsule (300 mg total) by mouth 3 (three) times daily. 90 capsule 2  . hydrocortisone (ANUSOL-HC) 25 MG suppository Place 1 suppository (25 mg total) rectally every 12 (twelve) hours. 12 suppository 1  . ipratropium-albuterol (DUONEB) 0.5-2.5 (3) MG/3ML SOLN Take 3 mLs by nebulization every 6 (six) hours. 360 mL 0  . Magnesium Oxide 400 (240 Mg) MG TABS TAKE ONE TABLET BY MOUTH DAILY. 90 tablet 3  . methocarbamol (ROBAXIN) 500 MG tablet Take 500 mg by mouth every 6 (six) hours as needed  for muscle spasms.     . metoprolol (TOPROL-XL) 200 MG 24 hr tablet Take 1 tablet (200 mg total) by mouth daily. 90 tablet 1  . metoprolol succinate (TOPROL XL) 25 MG 24 hr tablet Take 1 tablet (25 mg total) by mouth daily. 90 tablet 3  . Multiple Vitamin (MULTIVITAMIN WITH MINERALS) TABS tablet Take 1 tablet by mouth daily with lunch. Centrum    . nitroGLYCERIN (NITROSTAT) 0.4 MG SL tablet Place 0.4 mg under the tongue every 5 (five) minutes as needed for chest pain. Reported on 08/04/2015    . oxyCODONE (ROXICODONE) 5 MG immediate release tablet Take one tablet by mouth twice daily as needed for pain 60 tablet 0  . pantoprazole (PROTONIX) 40 MG tablet Take 40 mg by mouth daily.     . potassium chloride (K-DUR,KLOR-CON) 10 MEQ tablet TAKE TWO (2) TABLETS BY MOUTH DAILY. 180 tablet 3  . isosorbide mononitrate (IMDUR) 60 MG 24 hr tablet Take 1 tablet (60 mg total) by mouth daily. 90 tablet 3   No current facility-administered medications for this visit.     Past Medical History:  Diagnosis Date  . Anxiety   . Arthritis   . Atrial fibrillation ([place])   . Bursitis    Left shoulder  . Cataract   . COPD (chronic obstructive pulmonary disease) ([place])   . Coronary atherosclerosis of native coronary artery    a. s/p DES to LCx in 04/2013 b. cath in 11/2015 showing patent stent with 20% prox-LAD and 80% ostial RCA stenosis for which medical management was recommended due to small artery size  . Depression   . Diastolic heart failure ([facility])    EF 55-60%  . Dysphagia, unspecified(787.20)   . Essential hypertension   . GERD (gastroesophageal reflux disease)    Hx Schatzki's ring, multiple EGD/ED last 01/06/2004  . Gout   . Headache   . History of anemia   . Hyperlipidemia   . Internal hemorrhoids without mention of complication   . MI (myocardial infarction) ([place]) 2006  . Microscopic colitis 2003  . OSA (obstructive sleep apnea)   . Panic disorder without agoraphobia   . Paresthesia   .  Pneumonia 12/2011  . PVD (peripheral vascular disease) ([place])   . S/P colonoscopy 09/27/2001   internal hemorrhoids, desc colon inflam polyp, SB BX-chronic duodenitis, colitis  . Thyroid disease     Past Surgical History:  Procedure Laterality Date  . ABDOMINAL HYSTERECTOMY    . ABDOMINAL HYSTERECTOMY    . ANTERIOR AND POSTERIOR REPAIR     with resection of vagina  . ANTERIOR LAT LUMBAR FUSION N/A 08/01/2016   Procedure: Lumbar Two-Lumbar Five Transpsoas lateral interbody fusion with Lumbar Two-Three lateral plate fixation;  Surgeon: Kevan Ny Ditty, MD;  Location: [facility];  Service: Neurosurgery;  Laterality: N/A;  L2-5 Transpsoas lateral interbody fusion with L2-3 lateral plate fixation  . APPENDECTOMY    . BACK  SURGERY    . BIOPSY  07/05/2015   Procedure: BIOPSY;  Surgeon: Daneil Dolin, MD;  [facility]: [facility];  Service: Endoscopy;;  gastric polyp biopsy, ascending colon biopsy  . BLADDER SUSPENSION  11/09/2011   Procedure: TRANSVAGINAL TAPE (TVT) PROCEDURE;  Surgeon: Marissa Nestle, MD;  Location: [facility];  Service: Urology;  Laterality: N/A;  . bladder tack  06/2010  . BREAST LUMPECTOMY  1998   left, benign  . CARDIAC CATHETERIZATION    . CARDIAC CATHETERIZATION    . CARDIAC CATHETERIZATION N/A 12/16/2015   Procedure: Left Heart Cath and Coronary Angiography;  Surgeon: Troy Sine, MD;  Location: [facility];  Service: Cardiovascular;  Laterality: N/A;  . [place]   left  . CHOLECYSTECTOMY  1998  . Cholecystectomy    . COLONOSCOPY  03/16/2011   multiple hyperplastic colon polyps, sigmoid diverticulosis, melanosis coli  . COLONOSCOPY WITH PROPOFOL N/A 07/05/2015   RMR:one 5 mm polyp in descending colon  . CORONARY ANGIOPLASTY WITH STENT PLACEMENT    . ESOPHAGEAL DILATION N/A 07/05/2015   Procedure: ESOPHAGEAL DILATION;  Surgeon: Daneil Dolin, MD;  [facility];  Service: Endoscopy;  Laterality: N/A;  . ESOPHAGOGASTRODUODENOSCOPY  (EGD) WITH PROPOFOL N/A 07/05/2015   EYC:[id number]  . JOINT REPLACEMENT Right 2007   right knee  . left hand surgery    . LEFT HEART CATHETERIZATION WITH CORONARY ANGIOGRAM N/A 05/14/2013   Procedure: LEFT HEART CATHETERIZATION WITH CORONARY ANGIOGRAM;  Surgeon: Blane Ohara, MD;  Location[facility];  Service: Cardiovascular;  Laterality: N/A;  . left rotator cuff surgery    . LUMBAR LAMINECTOMY/DECOMPRESSION MICRODISCECTOMY N/A 10/11/2012   Procedure: LUMBAR LAMINECTOMY/DECOMPRESSION MICRODISCECTOMY 2 LEVELS;  Surgeon: Floyce Stakes, MD;  Location: [facility];  Service: Neurosurgery;  Laterality: N/A;  L3-4 L4-5 Laminectomy  . LUMBAR WOUND DEBRIDEMENT N/A 09/27/2015   Procedure: Exploration of Lumbar Wound w/ Repair CSF Leak/Lumbar Drain Placement;  Surgeon: Leeroy Cha, MD;  Location: [facility];  Service: Neurosurgery;  Laterality: N/A;  . MALONEY DILATION  03/16/2011   Gastritis. No H.pylori on bx. 76F maloney dilation with disruption of  occult cevical esophageal web  . NASAL SINUS SURGERY    . right knee replacement  2007  . right leg benign tumor    . SHOULDER SURGERY Left   . TONSILLECTOMY    . unspecified area, hysterectomy  1972   partial    Social History   Socioeconomic History  . Marital status: Divorced    Spouse name: Not on file  . Number of children: 5  . Years of education: Not on file  . Highest education level: Not on file  Occupational History  . Occupation: retired  Scientific laboratory technician  . Financial resource strain: Not on file  . Food insecurity:    Worry: Not on file    Inability: Not on file  . Transportation needs:    Medical: Not on file    Non-medical: Not on file  Tobacco Use  . Smoking status: Former Smoker    Packs/day: 1.00    Years: 64.00    Pack years: 64.00    Types: Cigarettes    Start date: 12/24/1947    Last attempt to quit: 11/17/2001    Years since quitting: 15.8  . Smokeless tobacco: Never Used  . Tobacco comment: Quit smoking  in 2003  Substance and Sexual Activity  . Alcohol use: No  Alcohol/week: 0.0 oz  . Drug use: No  . Sexual activity: Never  Lifestyle  . Physical activity:    Days per week: Not on file    Minutes per session: Not on file  . Stress: Not on file  Relationships  . Social connections:    Talks on phone: Not on file    Gets together: Not on file    Attends religious service: Not on file    Active member of club or organization: Not on file    Attends meetings of clubs or organizations: Not on file    Relationship status: Not on file  . Intimate partner violence:    Fear of current or ex partner: Not on file    Emotionally abused: Not on file    Physically abused: Not on file    Forced sexual activity: Not on file  Other Topics Concern  . Not on file  Social History Narrative   Divorced.   Sister had colon perforation & died from complications in [place], [place]     Vitals:   09/18/17 1313  BP: 116/64  Pulse: 95  SpO2: 99%  Weight: 157 lb (71.2 kg)  Height: 5\' 1"  (1.549 m)    Wt Readings from Last 3 Encounters:  09/18/17 157 lb (71.2 kg)  07/05/17 158 lb (71.7 kg)  06/14/17 151 lb (68.5 kg)     PHYSICAL EXAM General: NAD HEENT: Normal. Neck: No JVD, no thyromegaly. Lungs: Clear to auscultation bilaterally with normal respiratory effort. CV: Regular rate and irregular rhythm, normal S1/S2, no S3, no murmur. No pretibial or periankle edema.  Abdomen: Soft, nontender, mild distention.  Neurologic: Alert and oriented.  Psych: Normal affect. Skin: Normal. Musculoskeletal: No gross deformities.    ECG: Most recent ECG reviewed.   Labs: Lab Results  Component Value Date/Time   K 3.9 05/11/2017 01:20 PM   BUN 14 05/11/2017 01:20 PM   CREATININE 1.12 (H) 05/11/2017 01:20 PM   CREATININE 1.17 (H) 02/13/2011 03:26 PM   ALT 28 05/11/2017 01:16 PM   TSH 5.881 (H) 02/19/2017 07:36 PM   TSH 2.770 11/25/2013 02:14 PM   HGB 10.5 (L) 02/21/2017 04:10 AM      Lipids: Lab Results  Component Value Date/Time   LDLCALC 103 (H) 12/16/2015 04:35 AM   CHOL 177 12/16/2015 04:35 AM   TRIG 197 (H) 12/16/2015 04:35 AM   HDL 35 (L) 12/16/2015 04:35 AM       ASSESSMENT AND PLAN:  1. CAD with DES to LCx with angina (chest pain and exertional dyspnea):Symptoms persist.  Nuclear stress test was normal in March 2019 as noted above. I will increase Toprol-XL to 250 mg daily given resting heart rate in the 90 bpm range.  I will continue Imdur 60 mg daily. Statin intolerant. She is no longer on aspirin.She is on Eliquis for atrial fibrillation.  2. Essential HTN: Controlled. I will monitor given increase in Toprol-XL and addition of low dose lisinopril.  3.Acute on chroniccombinedsystolicand diastolicheart failure, EF 30-35% (previously 55%) with progressive exertional dyspnea and abdominal distention: Her weight is up 6 pounds since her last visit with me on 06/14/2017.    I will obtain a BNP.  I will increase Lasix to 40 mg twice daily for 4 days and also double up on her potassium to 40 mEq daily for 4 days.  I will obtain a basic metabolic panel in 3 days.  I will obtain a follow-up echocardiogram to evaluate for interval changes in  cardiac function. 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  I will increase Toprol-XL to 250 mg daily.  I will also add low-dose lisinopril 2.5 mg daily.  4. Hyperlipidemia: Statin intolerant.  5. Aortic regurgitation: I am obtaining a follow-up echocardiogram to evaluate cardiac function. Mild at present.  6. Permanent atrial fibrillation with exertional dyspnea:Heart rate is in the mid 90 bpm range onToprol-XL225mg  daily.  I will increase to 250 mg daily.  Continue Eliquis.    Disposition: Follow up 1 month  Time spent: 40 minutes, of which greater than 50% was spent reviewing symptoms, relevant blood tests and studies, and discussing management plan with the patient.    Kate Sable,  M.D., F.A.C.C.

## 2017-09-19 ENCOUNTER — Other Ambulatory Visit (HOSPITAL_COMMUNITY): Payer: Self-pay | Admitting: Neurological Surgery

## 2017-09-19 DIAGNOSIS — M5416 Radiculopathy, lumbar region: Secondary | ICD-10-CM

## 2017-09-27 ENCOUNTER — Ambulatory Visit (HOSPITAL_COMMUNITY): Admission: RE | Admit: 2017-09-27 | Payer: Medicare Other | Source: Ambulatory Visit

## 2017-09-27 ENCOUNTER — Ambulatory Visit (INDEPENDENT_AMBULATORY_CARE_PROVIDER_SITE_OTHER): Payer: Medicare Other | Admitting: Cardiovascular Disease

## 2017-09-27 ENCOUNTER — Telehealth: Payer: Self-pay | Admitting: Cardiovascular Disease

## 2017-09-27 ENCOUNTER — Other Ambulatory Visit: Payer: Self-pay | Admitting: Cardiovascular Disease

## 2017-09-27 ENCOUNTER — Ambulatory Visit (HOSPITAL_COMMUNITY): Payer: Medicare Other

## 2017-09-27 ENCOUNTER — Encounter: Payer: Self-pay | Admitting: Cardiovascular Disease

## 2017-09-27 ENCOUNTER — Encounter (HOSPITAL_COMMUNITY): Payer: Self-pay

## 2017-09-27 VITALS — BP 126/78 | HR 102 | Ht 61.0 in | Wt 152.4 lb

## 2017-09-27 DIAGNOSIS — Z01818 Encounter for other preprocedural examination: Secondary | ICD-10-CM

## 2017-09-27 DIAGNOSIS — I10 Essential (primary) hypertension: Secondary | ICD-10-CM | POA: Diagnosis not present

## 2017-09-27 DIAGNOSIS — Z955 Presence of coronary angioplasty implant and graft: Secondary | ICD-10-CM

## 2017-09-27 DIAGNOSIS — I25118 Atherosclerotic heart disease of native coronary artery with other forms of angina pectoris: Secondary | ICD-10-CM | POA: Diagnosis not present

## 2017-09-27 DIAGNOSIS — R531 Weakness: Secondary | ICD-10-CM

## 2017-09-27 DIAGNOSIS — I481 Persistent atrial fibrillation: Secondary | ICD-10-CM | POA: Diagnosis not present

## 2017-09-27 DIAGNOSIS — R0609 Other forms of dyspnea: Secondary | ICD-10-CM

## 2017-09-27 DIAGNOSIS — I5042 Chronic combined systolic (congestive) and diastolic (congestive) heart failure: Secondary | ICD-10-CM

## 2017-09-27 DIAGNOSIS — I4819 Other persistent atrial fibrillation: Secondary | ICD-10-CM

## 2017-09-27 NOTE — H&P (View-Only) (Signed)
SUBJECTIVE: The patient presents for follow-up of acute on chronic systolic heart failure.  She called our office today reporting shortness of breath with exertion.  She also complained of abdominal bloating.  She described orthopneic symptoms and lightheadedness.  Heart rate was elevated in the 95-100 bpm range.  Oxygen saturations were normal at 97%.  In summary, she underwent percutaneous coronary intervention with drug-eluting stent to the left circumflex coronary artery in January 2015.  Coronary angiography 12/16/15 showed 20% stenosis in the proximal LAD and in the inferior branch of the first diagonal with a widely patent previously placed circumflex stent. RCA was small and nondominant with 80% ostial stenosis. Medical therapy was recommended.  Nuclear stress test was normal on 07/02/2017.  Since I increased Lasix, weight is down 5 pounds from 09/18/2017 to 152 pounds.  She feels more fatigued and is tired after washing a few dishes.  She is short of breath with minimal exertion.  She missed 1 dose of Eliquis this past Monday she was going to see a back doctor but subsequently canceled the appointment.  Her appetite is diminished.  She describes nausea but no vomiting.  She has episodic chest pains in the left inframammary region radiating down the left arm lasting seconds.  Creatinine was 1.39 in January 2019.    She also complains of headaches.      Review of Systems: As per "subjective", otherwise negative.  Allergies  Allergen Reactions  . Cephalosporins Diarrhea and Nausea Only    Lightheaded  . Levaquin [Levofloxacin In D5w] Swelling  . Macrodantin [Nitrofurantoin Macrocrystal] Swelling  . Phenothiazines Anaphylaxis and Hives  . Polysorbate Anaphylaxis  . Prednisone Shortness Of Breath  . Buspirone Itching  . Cardura [Doxazosin Mesylate] Itching  . Codeine Itching  . Acyclovir And Related Itching    Redness of skin  . Prochlorperazine Other (See Comments)      "Upset stomach"  . Ranexa [Ranolazine]     Severe drop in BP  . Atorvastatin Hives    Cramping; tolerates Crestor ok  . Ofloxacin Rash  . Other Itching and Rash    "WOOL"= make skin look like it has been burned  . Penicillins Other (See Comments)    Causes redness all over. Has patient had a PCN reaction causing immediate rash, facial/tongue/throat swelling, SOB or lightheadedness with hypotension: No Has patient had a PCN reaction causing severe rash involving mucus membranes or skin necrosis: No Has patient had a PCN reaction that required hospitalization No Has patient had a PCN reaction occurring within the last 10 years: No If all of the above answers are "NO", then may proceed with Cephalosporin use.   . Pimozide Hives and Itching    Current Outpatient Medications  Medication Sig Dispense Refill  . acetaminophen (TYLENOL) 500 MG tablet Take 500 mg by mouth daily as needed for headache.     . allopurinol (ZYLOPRIM) 100 MG tablet Take 1 tablet (100 mg total) daily by mouth. 30 tablet 0  . ALPRAZolam (XANAX) 0.25 MG tablet Take 0.25 mg by mouth 2 (two) times daily as needed.  0  . DULoxetine (CYMBALTA) 60 MG capsule Take 60 mg by mouth at bedtime.     [name] Kitchen ELIQUIS 5 MG TABS tablet TAKE ONE TABLET BY MOUTH TWICE DAILY. 180 tablet 3  . fluticasone (FLONASE) 50 MCG/ACT nasal spray Place 2 sprays into both nostrils 2 (two) times daily as needed for allergies.     . furosemide (LASIX) 20  MG tablet Take 20 mg by mouth daily as needed for fluid. Take in addition to the 40mg  if needed for swelling    . furosemide (LASIX) 40 MG tablet TAKE ONE TABLET BY MOUTH DAILY. 90 tablet 3  . gabapentin (NEURONTIN) 300 MG capsule Take 1 capsule (300 mg total) by mouth 3 (three) times daily. 90 capsule 2  . hydrocortisone (ANUSOL-HC) 25 MG suppository Place 1 suppository (25 mg total) rectally every 12 (twelve) hours. (Patient taking differently: Place 25 mg rectally 2 (two) times daily as needed for  hemorrhoids. ) 12 suppository 1  . ipratropium-albuterol (DUONEB) 0.5-2.5 (3) MG/3ML SOLN Take 3 mLs by nebulization every 6 (six) hours. (Patient taking differently: Take 3 mLs by nebulization 2 (two) times daily. ) 360 mL 0  . isosorbide mononitrate (IMDUR) 60 MG 24 hr tablet Take 1 tablet (60 mg total) by mouth daily. 90 tablet 3  . lisinopril (PRINIVIL,ZESTRIL) 2.5 MG tablet Take 1 tablet (2.5 mg total) by mouth daily. 90 tablet 3  . Magnesium Oxide 400 (240 Mg) MG TABS TAKE ONE TABLET BY MOUTH DAILY. 90 tablet 3  . methocarbamol (ROBAXIN) 500 MG tablet Take 500 mg by mouth every 6 (six) hours as needed for muscle spasms.     . metoprolol (TOPROL-XL) 200 MG 24 hr tablet Take 1 tablet (200 mg total) by mouth daily. 90 tablet 1  . metoprolol succinate (TOPROL-XL) 50 MG 24 hr tablet Take 1 tablet (50 mg total) by mouth daily. 90 tablet 3  . Multiple Vitamin (MULTIVITAMIN WITH MINERALS) TABS tablet Take 1 tablet by mouth every other day. Centrum     . Multiple Vitamins-Minerals (HAIR SKIN AND NAILS FORMULA PO) Take 1 tablet by mouth daily.    . nitroGLYCERIN (NITROSTAT) 0.4 MG SL tablet Place 0.4 mg under the tongue every 5 (five) minutes as needed for chest pain. Reported on 08/04/2015    . pantoprazole (PROTONIX) 40 MG tablet Take 40 mg by mouth daily.     . potassium chloride (K-DUR,KLOR-CON) 10 MEQ tablet TAKE TWO (2) TABLETS BY MOUTH DAILY. (Patient taking differently: TAKE TWO (2) TABLETS (20 meq)  BY MOUTH DAILY.) 180 tablet 3   No current facility-administered medications for this visit.     Past Medical History:  Diagnosis Date  . Anxiety   . Arthritis   . Atrial fibrillation ([place])   . Bursitis    Left shoulder  . Cataract   . COPD (chronic obstructive pulmonary disease) ([place])   . Coronary atherosclerosis of native coronary artery    a. s/p DES to LCx in 04/2013 b. cath in 11/2015 showing patent stent with 20% prox-LAD and 80% ostial RCA stenosis for which medical management was  recommended due to small artery size  . Depression   . Diastolic heart failure ([facility])    EF 55-60%  . Dysphagia, unspecified(787.20)   . Essential hypertension   . GERD (gastroesophageal reflux disease)    Hx Schatzki's ring, multiple EGD/ED last 01/06/2004  . Gout   . Headache   . History of anemia   . Hyperlipidemia   . Internal hemorrhoids without mention of complication   . MI (myocardial infarction) ([place]) 2006  . Microscopic colitis 2003  . OSA (obstructive sleep apnea)   . Panic disorder without agoraphobia   . Paresthesia   . Pneumonia 12/2011  . PVD (peripheral vascular disease) ([place])   . S/P colonoscopy 09/27/2001   internal hemorrhoids, desc colon inflam polyp, SB BX-chronic duodenitis, colitis  .  Thyroid disease     Past Surgical History:  Procedure Laterality Date  . ABDOMINAL HYSTERECTOMY    . ABDOMINAL HYSTERECTOMY    . ANTERIOR AND POSTERIOR REPAIR     with resection of vagina  . ANTERIOR LAT LUMBAR FUSION N/A 08/01/2016   Procedure: Lumbar Two-Lumbar Five Transpsoas lateral interbody fusion with Lumbar Two-Three lateral plate fixation;  Surgeon: Kevan Ny Ditty, MD;  Location: [facility];  Service: Neurosurgery;  Laterality: N/A;  L2-5 Transpsoas lateral interbody fusion with L2-3 lateral plate fixation  . APPENDECTOMY    . BACK SURGERY    . BIOPSY  07/05/2015   Procedure: BIOPSY;  Surgeon: Daneil Dolin, MD;  [facility];  Service: Endoscopy;;  gastric polyp biopsy, ascending colon biopsy  . BLADDER SUSPENSION  11/09/2011   Procedure: TRANSVAGINAL TAPE (TVT) PROCEDURE;  Surgeon: Marissa Nestle, MD;  Location: [facility];  Service: Urology;  Laterality: N/A;  . bladder tack  06/2010  . BREAST LUMPECTOMY  1998   left, benign  . CARDIAC CATHETERIZATION    . CARDIAC CATHETERIZATION    . CARDIAC CATHETERIZATION N/A 12/16/2015   Procedure: Left Heart Cath and Coronary Angiography;  Surgeon: Troy Sine, MD;  Location: [facility];  Service:  Cardiovascular;  Laterality: N/A;  . [place]   left  . CHOLECYSTECTOMY  1998  . Cholecystectomy    . COLONOSCOPY  03/16/2011   multiple hyperplastic colon polyps, sigmoid diverticulosis, melanosis coli  . COLONOSCOPY WITH PROPOFOL N/A 07/05/2015   RMR:one 5 mm polyp in descending colon  . CORONARY ANGIOPLASTY WITH STENT PLACEMENT    . ESOPHAGEAL DILATION N/A 07/05/2015   Procedure: ESOPHAGEAL DILATION;  Surgeon: Daneil Dolin, MD;  [facility];  Service: Endoscopy;  Laterality: N/A;  . ESOPHAGOGASTRODUODENOSCOPY (EGD) WITH PROPOFOL N/A 07/05/2015   OAC:ZYSAYT  . JOINT REPLACEMENT Right 2007   right knee  . left hand surgery    . LEFT HEART CATHETERIZATION WITH CORONARY ANGIOGRAM N/A 05/14/2013   Procedure: LEFT HEART CATHETERIZATION WITH CORONARY ANGIOGRAM;  Surgeon: Blane Ohara, MD;  Location[facility] CATH LAB;  Service: Cardiovascular;  Laterality: N/A;  . left rotator cuff surgery    . LUMBAR LAMINECTOMY/DECOMPRESSION MICRODISCECTOMY N/A 10/11/2012   Procedure: LUMBAR LAMINECTOMY/DECOMPRESSION MICRODISCECTOMY 2 LEVELS;  Surgeon: Floyce Stakes, MD;  Location: [facility];  Service: Neurosurgery;  Laterality: N/A;  L3-4 L4-5 Laminectomy  . LUMBAR WOUND DEBRIDEMENT N/A 09/27/2015   Procedure: Exploration of Lumbar Wound w/ Repair CSF Leak/Lumbar Drain Placement;  Surgeon: Leeroy Cha, MD;  Location: [facility] OR[facility];  Service: Neurosurgery;  Laterality: N/A;  . MALONEY DILATION  03/16/2011   Gastritis. No H.pylori on bx. 27F maloney dilation with disruption of  occult cevical esophageal web  . NASAL SINUS SURGERY    . right knee replacement  2007  . right leg benign tumor    . SHOULDER SURGERY Left   . TONSILLECTOMY    . unspecified area, hysterectomy  1972   partial    Social History   Socioeconomic History  . Marital status: Divorced    Spouse name: Not on file  . Number of children: 5  . Years of education: Not on file  . Highest education  level: Not on file  Occupational History  . Occupation: retired  Scientific laboratory technician  . Financial resource strain: Not on file  . Food insecurity:    Worry: Not on file    Inability: Not  on file  . Transportation needs:    Medical: Not on file    Non-medical: Not on file  Tobacco Use  . Smoking status: Former Smoker    Packs/day: 1.00    Years: 64.00    Pack years: 64.00    Types: Cigarettes    Start date: 12/24/1947    Last attempt to quit: 11/17/2001    Years since quitting: 15.8  . Smokeless tobacco: Never Used  . Tobacco comment: Quit smoking in 2003  Substance and Sexual Activity  . Alcohol use: No    Alcohol/week: 0.0 oz  . Drug use: No  . Sexual activity: Never  Lifestyle  . Physical activity:    Days per week: Not on file    Minutes per session: Not on file  . Stress: Not on file  Relationships  . Social connections:    Talks on phone: Not on file    Gets together: Not on file    Attends religious service: Not on file    Active member of club or organization: Not on file    Attends meetings of clubs or organizations: Not on file    Relationship status: Not on file  . Intimate partner violence:    Fear of current or ex partner: Not on file    Emotionally abused: Not on file    Physically abused: Not on file    Forced sexual activity: Not on file  Other Topics Concern  . Not on file  Social History Narrative   Divorced.   Sister had colon perforation & died from complications in [place], [place]     Vitals:   09/27/17 1557  BP: 126/78  Pulse: (!) 102  SpO2: 97%  Weight: 152 lb 6.4 oz (69.1 kg)  Height: 5\' 1"  (1.549 m)    Wt Readings from Last 3 Encounters:  09/27/17 152 lb 6.4 oz (69.1 kg)  09/18/17 157 lb (71.2 kg)  07/05/17 158 lb (71.7 kg)     PHYSICAL EXAM General: NAD HEENT: Normal. Neck: No JVD, no thyromegaly. Lungs: Clear to auscultation bilaterally with normal respiratory effort. CV: Heart rate at upper normal limits with irregular rhythm, normal  S1/S2, no S3, no murmur. No pretibial or periankle edema.  Abdomen: Soft, nontender, no distention.  Neurologic: Alert and oriented.  Psych: Normal affect. Skin: Normal. Musculoskeletal: No gross deformities.    ECG: Most recent ECG reviewed.   Labs: Lab Results  Component Value Date/Time   K 3.9 05/11/2017 01:20 PM   BUN 14 05/11/2017 01:20 PM   CREATININE 1.12 (H) 05/11/2017 01:20 PM   CREATININE 1.17 (H) 02/13/2011 03:26 PM   ALT 28 05/11/2017 01:16 PM   TSH 5.881 (H) 02/19/2017 07:36 PM   TSH 2.770 11/25/2013 02:14 PM   HGB 10.5 (L) 02/21/2017 04:10 AM     Lipids: Lab Results  Component Value Date/Time   LDLCALC 103 (H) 12/16/2015 04:35 AM   CHOL 177 12/16/2015 04:35 AM   TRIG 197 (H) 12/16/2015 04:35 AM   HDL 35 (L) 12/16/2015 04:35 AM       ASSESSMENT AND PLAN:  1. CAD with DES to LCxwith angina (chest pain and exertional dyspnea):Symptoms persist.  Nuclear stress test was normal in March 2019 as noted above.  Last coronary angiogram details also described above. She is currently taking Toprol-XL 250 mg daily but I think she may be experiencing increased fatigue from this. I will continue Imdur 60 mg daily. Statin intolerant. She is no longer on  aspirin.She is on Eliquis for atrial fibrillation.  2. Essential HTN: Controlled. No changes to therapy.  3.Chroniccombinedsystolicand diastolicheart failure, EF 30-35% (previously 55%):Since I increased Lasix, weight is down 5 pounds from 09/18/2017 to 152 pounds.  Continue Lasix 40 mg daily.  See discussion in #6. I think her current symptoms with regards to exertional dyspnea and fatigue are related to atrial fibrillation. 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  She is currently taking Toprol-XL 250 mg daily and low-dose lisinopril 2.5 mg daily.  Unfortunately, I think she may be experiencing increased fatigue from the higher dose of beta-blockers.  4. Hyperlipidemia: Statin intolerant.  5.  Aortic regurgitation: I am obtaining a follow-up echocardiogram to evaluate cardiac function. Mild at present.  6.   Long-standing persistent atrial fibrillationwith exertional dyspnea and fatigue:Heart rate isin the 90-100 bpm rangeonToprol-XL250mg  daily. She missed 1 dose of Eliquis.  I would not add digoxin given chronic kidney disease stage III in this elderly woman.  I cannot use calcium channel blockers given their negative inotropic effect given her reduced LVEF.  I think she may be experiencing increasing fatigue from the higher dose of beta-blockers.  I will make an attempt at cardioversion with TEE beforehand given the missed dose of Eliquis.  I will have her take Toprol-XL 50 mg the morning of the procedure.  I will schedule this in 1 week with me as the schedulers (PACU/anesthesia) are currently unavailable to have it performed tomorrow.    Disposition: Follow up 1 month  Time spent: 40 minutes, of which greater than 50% was spent reviewing symptoms, relevant blood tests and studies, and discussing management plan with the patient.    Kate Sable, M.D., F.A.C.C.

## 2017-09-27 NOTE — Patient Instructions (Signed)
Medication Instructions:  Your physician recommends that you continue on your current medications as directed. Please refer to the Current Medication list given to you today.  Labwork: Your physician recommends that you return for lab work.    Testing/Procedures: Your physician has recommended that you have a Cardioversion (DCCV). Electrical Cardioversion uses a jolt of electricity to your heart either through paddles or wired patches attached to your chest. This is a controlled, usually prescheduled, procedure. Defibrillation is done under light anesthesia in the hospital, and you usually go home the day of the procedure. This is done to get your heart back into a normal rhythm. You are not awake for the procedure. Please see the instruction sheet given to you today.    Follow-Up: Your physician recommends that you schedule a follow-up appointment in: Pending test results    Any Other Special Instructions Will Be Listed Below (If Applicable).  Only take 50 mg of Toprol mg on the day of your Cardioversion    If you need a refill on your cardiac medications before your next appointment, please call your pharmacy. Thank you for choosing [facility]!

## 2017-09-27 NOTE — Telephone Encounter (Signed)
Pt would like to speak with the nurse concerning the medication change, she's not been feeling right.   lisinopril (PRINIVIL,ZESTRIL) 2.5 MG tablet [[id number]]   metoprolol succinate (TOPROL-XL) 50 MG 24 hr tablet [[id number]]

## 2017-09-27 NOTE — Progress Notes (Signed)
SUBJECTIVE: The patient presents for follow-up of acute on chronic systolic heart failure.  She called our office today reporting shortness of breath with exertion.  She also complained of abdominal bloating.  She described orthopneic symptoms and lightheadedness.  Heart rate was elevated in the 95-100 bpm range.  Oxygen saturations were normal at 97%.  In summary, she underwent percutaneous coronary intervention with drug-eluting stent to the left circumflex coronary artery in January 2015.  Coronary angiography 12/16/15 showed 20% stenosis in the proximal LAD and in the inferior branch of the first diagonal with a widely patent previously placed circumflex stent. RCA was small and nondominant with 80% ostial stenosis. Medical therapy was recommended.  Nuclear stress test was normal on 07/02/2017.  Since I increased Lasix, weight is down 5 pounds from 09/18/2017 to 152 pounds.  She feels more fatigued and is tired after washing a few dishes.  She is short of breath with minimal exertion.  She missed 1 dose of Eliquis this past Monday she was going to see a back doctor but subsequently canceled the appointment.  Her appetite is diminished.  She describes nausea but no vomiting.  She has episodic chest pains in the left inframammary region radiating down the left arm lasting seconds.  Creatinine was 1.39 in January 2019.    She also complains of headaches.      Review of Systems: As per "subjective", otherwise negative.  Allergies  Allergen Reactions  . Cephalosporins Diarrhea and Nausea Only    Lightheaded  . Levaquin [Levofloxacin In D5w] Swelling  . Macrodantin [Nitrofurantoin Macrocrystal] Swelling  . Phenothiazines Anaphylaxis and Hives  . Polysorbate Anaphylaxis  . Prednisone Shortness Of Breath  . Buspirone Itching  . Cardura [Doxazosin Mesylate] Itching  . Codeine Itching  . Acyclovir And Related Itching    Redness of skin  . Prochlorperazine Other (See Comments)      "Upset stomach"  . Ranexa [Ranolazine]     Severe drop in BP  . Atorvastatin Hives    Cramping; tolerates Crestor ok  . Ofloxacin Rash  . Other Itching and Rash    "WOOL"= make skin look like it has been burned  . Penicillins Other (See Comments)    Causes redness all over. Has patient had a PCN reaction causing immediate rash, facial/tongue/throat swelling, SOB or lightheadedness with hypotension: No Has patient had a PCN reaction causing severe rash involving mucus membranes or skin necrosis: No Has patient had a PCN reaction that required hospitalization No Has patient had a PCN reaction occurring within the last 10 years: No If all of the above answers are "NO", then may proceed with Cephalosporin use.   . Pimozide Hives and Itching    Current Outpatient Medications  Medication Sig Dispense Refill  . acetaminophen (TYLENOL) 500 MG tablet Take 500 mg by mouth daily as needed for headache.     . allopurinol (ZYLOPRIM) 100 MG tablet Take 1 tablet (100 mg total) daily by mouth. 30 tablet 0  . ALPRAZolam (XANAX) 0.25 MG tablet Take 0.25 mg by mouth 2 (two) times daily as needed.  0  . DULoxetine (CYMBALTA) 60 MG capsule Take 60 mg by mouth at bedtime.     [name] Kitchen ELIQUIS 5 MG TABS tablet TAKE ONE TABLET BY MOUTH TWICE DAILY. 180 tablet 3  . fluticasone (FLONASE) 50 MCG/ACT nasal spray Place 2 sprays into both nostrils 2 (two) times daily as needed for allergies.     . furosemide (LASIX) 20  MG tablet Take 20 mg by mouth daily as needed for fluid. Take in addition to the 40mg  if needed for swelling    . furosemide (LASIX) 40 MG tablet TAKE ONE TABLET BY MOUTH DAILY. 90 tablet 3  . gabapentin (NEURONTIN) 300 MG capsule Take 1 capsule (300 mg total) by mouth 3 (three) times daily. 90 capsule 2  . hydrocortisone (ANUSOL-HC) 25 MG suppository Place 1 suppository (25 mg total) rectally every 12 (twelve) hours. (Patient taking differently: Place 25 mg rectally 2 (two) times daily as needed for  hemorrhoids. ) 12 suppository 1  . ipratropium-albuterol (DUONEB) 0.5-2.5 (3) MG/3ML SOLN Take 3 mLs by nebulization every 6 (six) hours. (Patient taking differently: Take 3 mLs by nebulization 2 (two) times daily. ) 360 mL 0  . isosorbide mononitrate (IMDUR) 60 MG 24 hr tablet Take 1 tablet (60 mg total) by mouth daily. 90 tablet 3  . lisinopril (PRINIVIL,ZESTRIL) 2.5 MG tablet Take 1 tablet (2.5 mg total) by mouth daily. 90 tablet 3  . Magnesium Oxide 400 (240 Mg) MG TABS TAKE ONE TABLET BY MOUTH DAILY. 90 tablet 3  . methocarbamol (ROBAXIN) 500 MG tablet Take 500 mg by mouth every 6 (six) hours as needed for muscle spasms.     . metoprolol (TOPROL-XL) 200 MG 24 hr tablet Take 1 tablet (200 mg total) by mouth daily. 90 tablet 1  . metoprolol succinate (TOPROL-XL) 50 MG 24 hr tablet Take 1 tablet (50 mg total) by mouth daily. 90 tablet 3  . Multiple Vitamin (MULTIVITAMIN WITH MINERALS) TABS tablet Take 1 tablet by mouth every other day. Centrum     . Multiple Vitamins-Minerals (HAIR SKIN AND NAILS FORMULA PO) Take 1 tablet by mouth daily.    . nitroGLYCERIN (NITROSTAT) 0.4 MG SL tablet Place 0.4 mg under the tongue every 5 (five) minutes as needed for chest pain. Reported on 08/04/2015    . pantoprazole (PROTONIX) 40 MG tablet Take 40 mg by mouth daily.     . potassium chloride (K-DUR,KLOR-CON) 10 MEQ tablet TAKE TWO (2) TABLETS BY MOUTH DAILY. (Patient taking differently: TAKE TWO (2) TABLETS (20 meq)  BY MOUTH DAILY.) 180 tablet 3   No current facility-administered medications for this visit.     Past Medical History:  Diagnosis Date  . Anxiety   . Arthritis   . Atrial fibrillation ([place])   . Bursitis    Left shoulder  . Cataract   . COPD (chronic obstructive pulmonary disease) ([place])   . Coronary atherosclerosis of native coronary artery    a. s/p DES to LCx in 04/2013 b. cath in 11/2015 showing patent stent with 20% prox-LAD and 80% ostial RCA stenosis for which medical management was  recommended due to small artery size  . Depression   . Diastolic heart failure ([facility])    EF 55-60%  . Dysphagia, unspecified(787.20)   . Essential hypertension   . GERD (gastroesophageal reflux disease)    Hx Schatzki's ring, multiple EGD/ED last 01/06/2004  . Gout   . Headache   . History of anemia   . Hyperlipidemia   . Internal hemorrhoids without mention of complication   . MI (myocardial infarction) ([place]) 2006  . Microscopic colitis 2003  . OSA (obstructive sleep apnea)   . Panic disorder without agoraphobia   . Paresthesia   . Pneumonia 12/2011  . PVD (peripheral vascular disease) ([place])   . S/P colonoscopy 09/27/2001   internal hemorrhoids, desc colon inflam polyp, SB BX-chronic duodenitis, colitis  .  Thyroid disease     Past Surgical History:  Procedure Laterality Date  . ABDOMINAL HYSTERECTOMY    . ABDOMINAL HYSTERECTOMY    . ANTERIOR AND POSTERIOR REPAIR     with resection of vagina  . ANTERIOR LAT LUMBAR FUSION N/A 08/01/2016   Procedure: Lumbar Two-Lumbar Five Transpsoas lateral interbody fusion with Lumbar Two-Three lateral plate fixation;  Surgeon: Kevan Ny Ditty, MD;  Location: [facility];  Service: Neurosurgery;  Laterality: N/A;  L2-5 Transpsoas lateral interbody fusion with L2-3 lateral plate fixation  . APPENDECTOMY    . BACK SURGERY    . BIOPSY  07/05/2015   Procedure: BIOPSY;  Surgeon: Daneil Dolin, MD;  [facility];  Service: Endoscopy;;  gastric polyp biopsy, ascending colon biopsy  . BLADDER SUSPENSION  11/09/2011   Procedure: TRANSVAGINAL TAPE (TVT) PROCEDURE;  Surgeon: Marissa Nestle, MD;  Location: [facility];  Service: Urology;  Laterality: N/A;  . bladder tack  06/2010  . BREAST LUMPECTOMY  1998   left, benign  . CARDIAC CATHETERIZATION    . CARDIAC CATHETERIZATION    . CARDIAC CATHETERIZATION N/A 12/16/2015   Procedure: Left Heart Cath and Coronary Angiography;  Surgeon: Troy Sine, MD;  Location: [facility];  Service:  Cardiovascular;  Laterality: N/A;  . [place]   left  . CHOLECYSTECTOMY  1998  . Cholecystectomy    . COLONOSCOPY  03/16/2011   multiple hyperplastic colon polyps, sigmoid diverticulosis, melanosis coli  . COLONOSCOPY WITH PROPOFOL N/A 07/05/2015   RMR:one 5 mm polyp in descending colon  . CORONARY ANGIOPLASTY WITH STENT PLACEMENT    . ESOPHAGEAL DILATION N/A 07/05/2015   Procedure: ESOPHAGEAL DILATION;  Surgeon: Daneil Dolin, MD;  [facility];  Service: Endoscopy;  Laterality: N/A;  . ESOPHAGOGASTRODUODENOSCOPY (EGD) WITH PROPOFOL N/A 07/05/2015   [id number]  . JOINT REPLACEMENT Right 2007   right knee  . left hand surgery    . LEFT HEART CATHETERIZATION WITH CORONARY ANGIOGRAM N/A 05/14/2013   Procedure: LEFT HEART CATHETERIZATION WITH CORONARY ANGIOGRAM;  Surgeon: Blane Ohara, MD;  Location: [facility];  Service: Cardiovascular;  Laterality: N/A;  . left rotator cuff surgery    . LUMBAR LAMINECTOMY/DECOMPRESSION MICRODISCECTOMY N/A 10/11/2012   Procedure: LUMBAR LAMINECTOMY/DECOMPRESSION MICRODISCECTOMY 2 LEVELS;  Surgeon: Floyce Stakes, MD;  Location: [facility];  Service: Neurosurgery;  Laterality: N/A;  L3-4 L4-5 Laminectomy  . LUMBAR WOUND DEBRIDEMENT N/A 09/27/2015   Procedure: Exploration of Lumbar Wound w/ Repair CSF Leak/Lumbar Drain Placement;  Surgeon: Leeroy Cha, MD;  Location: [facility];  Service: Neurosurgery;  Laterality: N/A;  . MALONEY DILATION  03/16/2011   Gastritis. No H.pylori on bx. 33F maloney dilation with disruption of  occult cevical esophageal web  . NASAL SINUS SURGERY    . right knee replacement  2007  . right leg benign tumor    . SHOULDER SURGERY Left   . TONSILLECTOMY    . unspecified area, hysterectomy  1972   partial    Social History   Socioeconomic History  . Marital status: Divorced    Spouse name: Not on file  . Number of children: 5  . Years of education: Not on file  . Highest education  level: Not on file  Occupational History  . Occupation: retired  Scientific laboratory technician  . Financial resource strain: Not on file  . Food insecurity:    Worry: Not on file    Inability: Not  on file  . Transportation needs:    Medical: Not on file    Non-medical: Not on file  Tobacco Use  . Smoking status: Former Smoker    Packs/day: 1.00    Years: 64.00    Pack years: 64.00    Types: Cigarettes    Start date: 12/24/1947    Last attempt to quit: 11/17/2001    Years since quitting: 15.8  . Smokeless tobacco: Never Used  . Tobacco comment: Quit smoking in 2003  Substance and Sexual Activity  . Alcohol use: No    Alcohol/week: 0.0 oz  . Drug use: No  . Sexual activity: Never  Lifestyle  . Physical activity:    Days per week: Not on file    Minutes per session: Not on file  . Stress: Not on file  Relationships  . Social connections:    Talks on phone: Not on file    Gets together: Not on file    Attends religious service: Not on file    Active member of club or organization: Not on file    Attends meetings of clubs or organizations: Not on file    Relationship status: Not on file  . Intimate partner violence:    Fear of current or ex partner: Not on file    Emotionally abused: Not on file    Physically abused: Not on file    Forced sexual activity: Not on file  Other Topics Concern  . Not on file  Social History Narrative   Divorced.   Sister had colon perforation & died from complications in [place], [place]     Vitals:   09/27/17 1557  BP: 126/78  Pulse: (!) 102  SpO2: 97%  Weight: 152 lb 6.4 oz (69.1 kg)  Height: 5\' 1"  (1.549 m)    Wt Readings from Last 3 Encounters:  09/27/17 152 lb 6.4 oz (69.1 kg)  09/18/17 157 lb (71.2 kg)  07/05/17 158 lb (71.7 kg)     PHYSICAL EXAM General: NAD HEENT: Normal. Neck: No JVD, no thyromegaly. Lungs: Clear to auscultation bilaterally with normal respiratory effort. CV: Heart rate at upper normal limits with irregular rhythm, normal  S1/S2, no S3, no murmur. No pretibial or periankle edema.  Abdomen: Soft, nontender, no distention.  Neurologic: Alert and oriented.  Psych: Normal affect. Skin: Normal. Musculoskeletal: No gross deformities.    ECG: Most recent ECG reviewed.   Labs: Lab Results  Component Value Date/Time   K 3.9 05/11/2017 01:20 PM   BUN 14 05/11/2017 01:20 PM   CREATININE 1.12 (H) 05/11/2017 01:20 PM   CREATININE 1.17 (H) 02/13/2011 03:26 PM   ALT 28 05/11/2017 01:16 PM   TSH 5.881 (H) 02/19/2017 07:36 PM   TSH 2.770 11/25/2013 02:14 PM   HGB 10.5 (L) 02/21/2017 04:10 AM     Lipids: Lab Results  Component Value Date/Time   LDLCALC 103 (H) 12/16/2015 04:35 AM   CHOL 177 12/16/2015 04:35 AM   TRIG 197 (H) 12/16/2015 04:35 AM   HDL 35 (L) 12/16/2015 04:35 AM       ASSESSMENT AND PLAN:  1. CAD with DES to LCxwith angina (chest pain and exertional dyspnea):Symptoms persist.  Nuclear stress test was normal in March 2019 as noted above.  Last coronary angiogram details also described above. She is currently taking Toprol-XL 250 mg daily but I think she may be experiencing increased fatigue from this. I will continue Imdur 60 mg daily. Statin intolerant. She is no longer on  aspirin.She is on Eliquis for atrial fibrillation.  2. Essential HTN: Controlled. No changes to therapy.  3.Chroniccombinedsystolicand diastolicheart failure, EF 30-35% (previously 55%):Since I increased Lasix, weight is down 5 pounds from 09/18/2017 to 152 pounds.  Continue Lasix 40 mg daily.  See discussion in #6. I think her current symptoms with regards to exertional dyspnea and fatigue are related to atrial fibrillation. 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  She is currently taking Toprol-XL 250 mg daily and low-dose lisinopril 2.5 mg daily.  Unfortunately, I think she may be experiencing increased fatigue from the higher dose of beta-blockers.  4. Hyperlipidemia: Statin intolerant.  5.  Aortic regurgitation: I am obtaining a follow-up echocardiogram to evaluate cardiac function. Mild at present.  6.   Long-standing persistent atrial fibrillationwith exertional dyspnea and fatigue:Heart rate isin the 90-100 bpm rangeonToprol-XL250mg  daily. She missed 1 dose of Eliquis.  I would not add digoxin given chronic kidney disease stage III in this elderly woman.  I cannot use calcium channel blockers given their negative inotropic effect given her reduced LVEF.  I think she may be experiencing increasing fatigue from the higher dose of beta-blockers.  I will make an attempt at cardioversion with TEE beforehand given the missed dose of Eliquis.  I will have her take Toprol-XL 50 mg the morning of the procedure.  I will schedule this in 1 week with me as the schedulers (PACU/anesthesia) are currently unavailable to have it performed tomorrow.    Disposition: Follow up 1 month  Time spent: 40 minutes, of which greater than 50% was spent reviewing symptoms, relevant blood tests and studies, and discussing management plan with the patient.    Kate Sable, M.D., F.A.C.C.

## 2017-09-27 NOTE — Telephone Encounter (Signed)
Spoke with pt, she feels funny today. She reports SOB with walking to the bathroom in the house. She has no edema in legs but she feels bloated in her abdomen. Her weight is stable. The head of her bed is elevated and she is not sleeping because her breathing is bad. She also reports lightheadedness and feeling funny. Her bp in the right is 105/53 with pulse 95 and in the left 127/59 p=100 by her home machine. She also reports double vision since last night. Her O2 sat is 97%. She is having a pain in her chest that will go down her left arm occ. She will come to the office today to be seen.

## 2017-10-02 ENCOUNTER — Other Ambulatory Visit: Payer: Self-pay

## 2017-10-02 ENCOUNTER — Encounter (HOSPITAL_COMMUNITY): Payer: Self-pay

## 2017-10-02 ENCOUNTER — Encounter (HOSPITAL_COMMUNITY)
Admission: RE | Admit: 2017-10-02 | Discharge: 2017-10-02 | Disposition: A | Discharge status: Home / Self Care | Payer: Medicare Other | Source: Ambulatory Visit | Attending: Cardiovascular Disease | Admitting: Cardiovascular Disease

## 2017-10-02 DIAGNOSIS — I351 Nonrheumatic aortic (valve) insufficiency: Secondary | ICD-10-CM | POA: Diagnosis not present

## 2017-10-02 DIAGNOSIS — N183 Chronic kidney disease, stage 3 (moderate): Secondary | ICD-10-CM | POA: Diagnosis not present

## 2017-10-02 DIAGNOSIS — I2510 Atherosclerotic heart disease of native coronary artery without angina pectoris: Secondary | ICD-10-CM | POA: Diagnosis not present

## 2017-10-02 DIAGNOSIS — Z96651 Presence of right artificial knee joint: Secondary | ICD-10-CM | POA: Diagnosis not present

## 2017-10-02 DIAGNOSIS — F419 Anxiety disorder, unspecified: Secondary | ICD-10-CM | POA: Diagnosis not present

## 2017-10-02 DIAGNOSIS — K219 Gastro-esophageal reflux disease without esophagitis: Secondary | ICD-10-CM | POA: Diagnosis not present

## 2017-10-02 DIAGNOSIS — J449 Chronic obstructive pulmonary disease, unspecified: Secondary | ICD-10-CM | POA: Diagnosis not present

## 2017-10-02 DIAGNOSIS — G4733 Obstructive sleep apnea (adult) (pediatric): Secondary | ICD-10-CM | POA: Diagnosis not present

## 2017-10-02 DIAGNOSIS — I481 Persistent atrial fibrillation: Secondary | ICD-10-CM | POA: Diagnosis not present

## 2017-10-02 DIAGNOSIS — M109 Gout, unspecified: Secondary | ICD-10-CM | POA: Diagnosis not present

## 2017-10-02 DIAGNOSIS — Z7901 Long term (current) use of anticoagulants: Secondary | ICD-10-CM | POA: Diagnosis not present

## 2017-10-02 DIAGNOSIS — I739 Peripheral vascular disease, unspecified: Secondary | ICD-10-CM | POA: Diagnosis not present

## 2017-10-02 DIAGNOSIS — I130 Hypertensive heart and chronic kidney disease with heart failure and stage 1 through stage 4 chronic kidney disease, or unspecified chronic kidney disease: Secondary | ICD-10-CM | POA: Diagnosis not present

## 2017-10-02 DIAGNOSIS — F329 Major depressive disorder, single episode, unspecified: Secondary | ICD-10-CM | POA: Diagnosis not present

## 2017-10-02 DIAGNOSIS — Z79899 Other long term (current) drug therapy: Secondary | ICD-10-CM | POA: Diagnosis not present

## 2017-10-02 DIAGNOSIS — I5043 Acute on chronic combined systolic (congestive) and diastolic (congestive) heart failure: Secondary | ICD-10-CM | POA: Diagnosis not present

## 2017-10-02 DIAGNOSIS — I252 Old myocardial infarction: Secondary | ICD-10-CM | POA: Diagnosis not present

## 2017-10-02 DIAGNOSIS — Z87891 Personal history of nicotine dependence: Secondary | ICD-10-CM | POA: Diagnosis not present

## 2017-10-02 DIAGNOSIS — E785 Hyperlipidemia, unspecified: Secondary | ICD-10-CM | POA: Diagnosis not present

## 2017-10-02 DIAGNOSIS — Z955 Presence of coronary angioplasty implant and graft: Secondary | ICD-10-CM | POA: Diagnosis not present

## 2017-10-02 HISTORY — DX: Dyspnea, unspecified: R06.00

## 2017-10-02 HISTORY — DX: Cardiac arrhythmia, unspecified: I49.9

## 2017-10-02 LAB — BASIC METABOLIC PANEL
Anion gap: 11 (ref 5–15)
BUN: 32 mg/dL — AB (ref 6–20)
CO2: 22 mmol/L (ref 22–32)
Calcium: 9 mg/dL (ref 8.9–10.3)
Chloride: 99 mmol/L — ABNORMAL LOW (ref 101–111)
Creatinine, Ser: 1.56 mg/dL — ABNORMAL HIGH (ref 0.44–1.00)
GFR calc Af Amer: 35 mL/min — ABNORMAL LOW (ref >60)
GFR, EST NON AFRICAN AMERICAN: 30 mL/min — AB (ref >60)
Glucose, Bld: 102 mg/dL — ABNORMAL HIGH (ref 65–99)
POTASSIUM: 4 mmol/L (ref 3.5–5.1)
Sodium: 132 mmol/L — ABNORMAL LOW (ref 135–145)

## 2017-10-02 LAB — CBC WITH DIFFERENTIAL/PLATELET
BASOS ABS: 0.1 10*3/uL (ref 0.0–0.1)
Basophils Relative: 1 %
EOS PCT: 3 %
Eosinophils Absolute: 0.2 10*3/uL (ref 0.0–0.7)
HCT: 38.6 % (ref 36.0–46.0)
Hemoglobin: 12.5 g/dL (ref 12.0–15.0)
LYMPHS ABS: 2.7 10*3/uL (ref 0.7–4.0)
Lymphocytes Relative: 32 %
MCH: 28.6 pg (ref 26.0–34.0)
MCHC: 32.4 g/dL (ref 30.0–36.0)
MCV: 88.3 fL (ref 78.0–100.0)
Monocytes Absolute: 0.6 10*3/uL (ref 0.1–1.0)
Monocytes Relative: 8 %
Neutro Abs: 4.8 10*3/uL (ref 1.7–7.7)
Neutrophils Relative %: 56 %
PLATELETS: 310 10*3/uL (ref 150–400)
RBC: 4.37 MIL/uL (ref 3.87–5.11)
RDW: 14.9 % (ref 11.5–15.5)
WBC: 8.4 10*3/uL (ref 4.0–10.5)

## 2017-10-02 NOTE — Patient Instructions (Addendum)
[name]  10/02/2017     @PREFPERIOPPHARMACY @   Your procedure is scheduled on 10/04/17.  Report to [name] at 0830 A.M.  Call this number if you have problems the morning of surgery:  [phone]   Remember:  Do not eat or drink after midnight.     Take these medicines the morning of surgery with A SIP OF WATER eliquis, protonix, metoprolol, lisinopril, imdur, neurontin, xanax, & cymbalta. [place] your albuterol inhaler & bring it with you.    Do not wear jewelry, make-up or nail polish.  Do not wear lotions, powders, or perfumes, or deodorant.  Do not shave 48 hours prior to surgery.  Men may shave face and neck.  Do not bring valuables to the hospital.  [facility] is not responsible for any belongings or valuables.  Contacts, dentures or bridgework may not be worn into surgery.  Leave your suitcase in the car.  After surgery it may be brought to your room.  For patients admitted to the hospital, discharge time will be determined by your treatment team.  Patients discharged the day of surgery will not be allowed to drive home.   Name and phone number of your driver:  family Special instructions:    Please read over the following fact sheets that you were given. Anesthesia Post-op Instructions and Care and Recovery After Surgery      PATIENT INSTRUCTIONS POST-ANESTHESIA  IMMEDIATELY FOLLOWING SURGERY:  Do not drive or operate machinery for the first twenty four hours after surgery.  Do not make any important decisions for twenty four hours after surgery or while taking narcotic pain medications or sedatives.  If you develop intractable nausea and vomiting or a severe headache please notify your doctor immediately.  FOLLOW-UP:  Please make an appointment with your surgeon as instructed. You do not need to follow up with anesthesia unless specifically instructed to do so.  WOUND CARE INSTRUCTIONS (if applicable):  Keep a dry clean dressing on the anesthesia/puncture  wound site if there is drainage.  Once the wound has quit draining you may leave it open to air.  Generally you should leave the bandage intact for twenty four hours unless there is drainage.  If the epidural site drains for more than 36-48 hours please call the anesthesia department.  QUESTIONS?:  Please feel free to call your physician or the hospital operator if you have any questions, and they will be happy to assist you.      Electrical Cardioversion Electrical cardioversion is the delivery of a jolt of electricity to restore a normal rhythm to the heart. A rhythm that is too fast or is not regular keeps the heart from pumping well. In this procedure, sticky patches or metal paddles are placed on the chest to deliver electricity to the heart from a device. This procedure may be done in an emergency if:  There is low or no blood pressure as a result of the heart rhythm.  Normal rhythm must be restored as fast as possible to protect the brain and heart from further damage.  It may save a life.  This procedure may also be done for irregular or fast heart rhythms that are not immediately life-threatening. Tell a health care provider about:  Any allergies you have.  All medicines you are taking, including vitamins, herbs, eye drops, creams, and over-the-counter medicines.  Any problems you or family members have had with anesthetic medicines.  Any blood disorders you have.  Any  surgeries you have had.  Any medical conditions you have.  Whether you are pregnant or may be pregnant. What are the risks? Generally, this is a safe procedure. However, problems may occur, including:  Allergic reactions to medicines.  A blood clot that breaks free and travels to other parts of your body.  The possible return of an abnormal heart rhythm within hours or days after the procedure.  Your heart stopping (cardiac arrest). This is rare.  What happens before the procedure? Medicines  Your  health care provider may have you start taking: ? Blood-thinning medicines (anticoagulants) so your blood does not clot as easily. ? Medicines may be given to help stabilize your heart rate and rhythm.  Ask your health care provider about changing or stopping your regular medicines. This is especially important if you are taking diabetes medicines or blood thinners. General instructions  Plan to have someone take you home from the hospital or clinic.  If you will be going home right after the procedure, plan to have someone with you for 24 hours.  Follow instructions from your health care provider about eating or drinking restrictions. What happens during the procedure?  To lower your risk of infection: ? Your health care team will wash or sanitize their hands. ? Your skin will be washed with soap.  An IV tube will be inserted into one of your veins.  You will be given a medicine to help you relax (sedative).  Sticky patches (electrodes) or metal paddles may be placed on your chest.  An electrical shock will be delivered. The procedure may vary among health care providers and hospitals. What happens after the procedure?  Your blood pressure, heart rate, breathing rate, and blood oxygen level will be monitored until the medicines you were given have worn off.  Do not drive for 24 hours if you were given a sedative.  Your heart rhythm will be watched to make sure it does not change. This information is not intended to replace advice given to you by your health care provider. Make sure you discuss any questions you have with your health care provider. Document Released: 03/24/2002 Document Revised: 12/01/2015 Document Reviewed: 10/08/2015 Elsevier Interactive Patient Education  2017 [place]. Transesophageal Echocardiogram Transesophageal echocardiography (TEE) is a picture test of your heart using sound waves. The pictures taken can give very detailed pictures of your heart.  This can help your doctor see if there are problems with your heart. TEE can check:  If your heart has blood clots in it.  How well your heart valves are working.  If you have an infection on the inside of your heart.  Some of the major arteries of your heart.  If your heart valve is working after a Office manager.  Your heart before a procedure that uses a shock to your heart to get the rhythm back to normal.  What happens before the procedure?  Do not eat or drink for 6 hours before the procedure or as told by your doctor.  Make plans to have someone drive you home after the procedure. Do not drive yourself home.  An IV tube will be put in your arm. What happens during the procedure?  You will be given a medicine to help you relax (sedative). It will be given through the IV tube.  A numbing medicine will be sprayed or gargled in the back of your throat to help numb it.  The tip of the probe is placed into the back  of your mouth. You will be asked to swallow. This helps to pass the probe into your esophagus.  Once the tip of the probe is in the right place, your doctor can take pictures of your heart.  You may feel pressure at the back of your throat. What happens after the procedure?  You will be taken to a recovery area so the sedative can wear off.  Your throat may be sore and scratchy. This will go away slowly over time.  You will go home when you are fully awake and able to swallow liquids.  You should have someone stay with you for the next 24 hours.  Do not drive or operate machinery for the next 24 hours. This information is not intended to replace advice given to you by your health care provider. Make sure you discuss any questions you have with your health care provider. Document Released: 01/29/2009 Document Revised: 09/09/2015 Document Reviewed: 10/03/2012 Elsevier Interactive Patient Education  [employer].

## 2017-10-04 ENCOUNTER — Ambulatory Visit (HOSPITAL_COMMUNITY): Payer: Medicare Other | Admitting: Anesthesiology

## 2017-10-04 ENCOUNTER — Other Ambulatory Visit: Payer: Self-pay

## 2017-10-04 ENCOUNTER — Ambulatory Visit (HOSPITAL_COMMUNITY)
Admission: RE | Admit: 2017-10-04 | Discharge: 2017-10-04 | Disposition: A | Discharge status: Home / Self Care | Payer: Medicare Other | Source: Ambulatory Visit | Attending: Cardiovascular Disease | Admitting: Cardiovascular Disease

## 2017-10-04 ENCOUNTER — Inpatient Hospital Stay (HOSPITAL_COMMUNITY): Admission: RE | Admit: 2017-10-04 | Payer: Medicare Other | Source: Ambulatory Visit

## 2017-10-04 ENCOUNTER — Encounter (HOSPITAL_COMMUNITY): Payer: Self-pay | Admitting: *Deleted

## 2017-10-04 ENCOUNTER — Ambulatory Visit (HOSPITAL_BASED_OUTPATIENT_CLINIC_OR_DEPARTMENT_OTHER): Payer: Medicare Other

## 2017-10-04 ENCOUNTER — Encounter (HOSPITAL_COMMUNITY)
Admission: RE | Disposition: A | Discharge status: Home / Self Care | Payer: Self-pay | Source: Ambulatory Visit | Attending: Cardiovascular Disease

## 2017-10-04 ENCOUNTER — Telehealth: Payer: Self-pay

## 2017-10-04 DIAGNOSIS — Z955 Presence of coronary angioplasty implant and graft: Secondary | ICD-10-CM | POA: Diagnosis not present

## 2017-10-04 DIAGNOSIS — I130 Hypertensive heart and chronic kidney disease with heart failure and stage 1 through stage 4 chronic kidney disease, or unspecified chronic kidney disease: Secondary | ICD-10-CM | POA: Insufficient documentation

## 2017-10-04 DIAGNOSIS — Z7901 Long term (current) use of anticoagulants: Secondary | ICD-10-CM | POA: Insufficient documentation

## 2017-10-04 DIAGNOSIS — G4733 Obstructive sleep apnea (adult) (pediatric): Secondary | ICD-10-CM | POA: Insufficient documentation

## 2017-10-04 DIAGNOSIS — I5043 Acute on chronic combined systolic (congestive) and diastolic (congestive) heart failure: Secondary | ICD-10-CM | POA: Diagnosis not present

## 2017-10-04 DIAGNOSIS — I480 Paroxysmal atrial fibrillation: Secondary | ICD-10-CM

## 2017-10-04 DIAGNOSIS — Z87891 Personal history of nicotine dependence: Secondary | ICD-10-CM | POA: Insufficient documentation

## 2017-10-04 DIAGNOSIS — I481 Persistent atrial fibrillation: Secondary | ICD-10-CM | POA: Diagnosis not present

## 2017-10-04 DIAGNOSIS — Z79899 Other long term (current) drug therapy: Secondary | ICD-10-CM | POA: Diagnosis not present

## 2017-10-04 DIAGNOSIS — K219 Gastro-esophageal reflux disease without esophagitis: Secondary | ICD-10-CM | POA: Insufficient documentation

## 2017-10-04 DIAGNOSIS — I2510 Atherosclerotic heart disease of native coronary artery without angina pectoris: Secondary | ICD-10-CM | POA: Insufficient documentation

## 2017-10-04 DIAGNOSIS — M109 Gout, unspecified: Secondary | ICD-10-CM | POA: Insufficient documentation

## 2017-10-04 DIAGNOSIS — I739 Peripheral vascular disease, unspecified: Secondary | ICD-10-CM | POA: Insufficient documentation

## 2017-10-04 DIAGNOSIS — E785 Hyperlipidemia, unspecified: Secondary | ICD-10-CM | POA: Insufficient documentation

## 2017-10-04 DIAGNOSIS — I5022 Chronic systolic (congestive) heart failure: Secondary | ICD-10-CM

## 2017-10-04 DIAGNOSIS — N183 Chronic kidney disease, stage 3 (moderate): Secondary | ICD-10-CM | POA: Insufficient documentation

## 2017-10-04 DIAGNOSIS — I252 Old myocardial infarction: Secondary | ICD-10-CM | POA: Insufficient documentation

## 2017-10-04 DIAGNOSIS — I4891 Unspecified atrial fibrillation: Secondary | ICD-10-CM | POA: Diagnosis not present

## 2017-10-04 DIAGNOSIS — F329 Major depressive disorder, single episode, unspecified: Secondary | ICD-10-CM | POA: Insufficient documentation

## 2017-10-04 DIAGNOSIS — F419 Anxiety disorder, unspecified: Secondary | ICD-10-CM | POA: Diagnosis not present

## 2017-10-04 DIAGNOSIS — J449 Chronic obstructive pulmonary disease, unspecified: Secondary | ICD-10-CM | POA: Insufficient documentation

## 2017-10-04 DIAGNOSIS — I10 Essential (primary) hypertension: Secondary | ICD-10-CM | POA: Diagnosis not present

## 2017-10-04 DIAGNOSIS — Z96651 Presence of right artificial knee joint: Secondary | ICD-10-CM | POA: Insufficient documentation

## 2017-10-04 DIAGNOSIS — I351 Nonrheumatic aortic (valve) insufficiency: Secondary | ICD-10-CM | POA: Insufficient documentation

## 2017-10-04 DIAGNOSIS — R0609 Other forms of dyspnea: Secondary | ICD-10-CM

## 2017-10-04 HISTORY — PX: TEE WITHOUT CARDIOVERSION: SHX5443

## 2017-10-04 HISTORY — PX: CARDIOVERSION: SHX1299

## 2017-10-04 SURGERY — CARDIOVERSION
Anesthesia: General

## 2017-10-04 MED ORDER — FENTANYL CITRATE (PF) 100 MCG/2ML IJ SOLN: 25.0000 ug | INTRAMUSCULAR | Status: DC | PRN

## 2017-10-04 MED ORDER — PROPOFOL 10 MG/ML IV BOLUS
INTRAVENOUS | Status: DC | PRN
  Administered 2017-10-04 (×6): 20 mg via INTRAVENOUS

## 2017-10-04 MED ORDER — SODIUM CHLORIDE 0.9 % IV SOLN: INTRAVENOUS | Status: DC

## 2017-10-04 MED ORDER — KETAMINE HCL 10 MG/ML IJ SOLN
INTRAMUSCULAR | Status: AC
  Filled 2017-10-04: qty 1

## 2017-10-04 MED ORDER — LACTATED RINGERS IV SOLN
INTRAVENOUS | Status: DC
  Administered 2017-10-04: 1000 mL via INTRAVENOUS

## 2017-10-04 MED ORDER — KETAMINE HCL 10 MG/ML IJ SOLN
INTRAMUSCULAR | Status: DC | PRN
  Administered 2017-10-04: 4 mg via INTRAVENOUS
  Administered 2017-10-04: 10 mg via INTRAVENOUS

## 2017-10-04 MED ORDER — PROPOFOL 500 MG/50ML IV EMUL
INTRAVENOUS | Status: DC | PRN
  Administered 2017-10-04: 75 ug/kg/min via INTRAVENOUS

## 2017-10-04 NOTE — Progress Notes (Signed)
Telephone call received from Dr. Jacinta Shoe. "I have already gone over the medications and the only changes are Toprol XL 50mg  twice daily by mouth and Lasix 40mg  and 20mg  alternating and discussed with family, all other medications will stay the same."

## 2017-10-04 NOTE — Telephone Encounter (Signed)
I spoke with [name] in PACU [phone], pt is still there, they will give her discharge instructions to include change in lasix dosing and Toprol changes

## 2017-10-04 NOTE — Progress Notes (Signed)
Electrical Cardioversion Procedure Note [name] [id number] 12-17-1932  Procedure: Electrical Cardioversion Indications:  Atrial Fibrillation  Procedure Details Consent: Risks of procedure as well as the alternatives and risks of each were explained to the (patient/caregiver).  Consent for procedure obtained. Time Out: Verified patient identification, verified procedure, site/side was marked, verified correct patient position, special equipment/implants available, medications/allergies/relevent history reviewed, required imaging and test results available.  Time out performed at 1029.   Patient placed on cardiac monitor, pulse oximetry, supplemental oxygen as necessary.  Sedation given: Benzodiazepines Pacer pads placed anterior and posterior chest.  Cardioverted 1 time(s).  Cardioverted at 150J.  Evaluation Findings: Post procedure EKG shows: NSR Complications: None Patient did tolerate procedure well.   [name] 10/04/2017, 2:17 PM   1057-post cardioversion EKG done.  1101-Transferred to PACU nurses notes. Strips mounted.

## 2017-10-04 NOTE — Anesthesia Preprocedure Evaluation (Signed)
Anesthesia Evaluation  Patient identified by MRN, date of birth, ID band Patient awake    Reviewed: Allergy & Precautions, NPO status , Patient's Chart, lab work & pertinent test results  Airway Mallampati: I  TM Distance: >3 FB Neck ROM: Full    Dental no notable dental hx.    Pulmonary neg pulmonary ROS, shortness of breath and at rest, pneumonia, resolved, COPD,  COPD inhaler, former smoker,  States Nebulizer broken since 12/2016 States increasing SOB -? Related to Afib    Pulmonary exam normal breath sounds clear to auscultation       Cardiovascular Exercise Tolerance: Poor hypertension, + angina with exertion + CAD, + Past MI, + Peripheral Vascular Disease and +CHF  negative cardio ROS Normal cardiovascular exam+ dysrhythmias Atrial Fibrillation III Rhythm:Irregular Rate:Abnormal     Neuro/Psych  Headaches, PSYCHIATRIC DISORDERS Anxiety Depression  Neuromuscular disease negative neurological ROS  negative psych ROS   GI/Hepatic negative GI ROS, Neg liver ROS, GERD  Medicated and Controlled,  Endo/Other  negative endocrine ROS  Renal/GU Renal InsufficiencyRenal diseasenegative Renal ROS  negative genitourinary   Musculoskeletal negative musculoskeletal ROS (+) Arthritis , Osteoarthritis,    Abdominal   Peds negative pediatric ROS (+)  Hematology negative hematology ROS (+) anemia ,   Anesthesia Other Findings   Reproductive/Obstetrics negative OB ROS                             Anesthesia Physical Anesthesia Plan  ASA: IV  Anesthesia Plan: General   Post-op Pain Management:    Induction: Intravenous  PONV Risk Score and Plan:   Airway Management Planned: Simple Face Mask  Additional Equipment:   Intra-op Plan:   Post-operative Plan:   Informed Consent: I have reviewed the patients History and Physical, chart, labs and discussed the procedure including the risks,  benefits and alternatives for the proposed anesthesia with the patient or authorized representative who has indicated his/her understanding and acceptance.     Plan Discussed with: CRNA  Anesthesia Plan Comments:         Anesthesia Quick Evaluation

## 2017-10-04 NOTE — Anesthesia Procedure Notes (Signed)
Procedure Name: MAC Date/Time: 10/04/2017 10:15 AM Performed by: Vista Deck, CRNA Pre-anesthesia Checklist: Patient identified, Emergency Drugs available, Suction available, Timeout performed and Patient being monitored Patient Re-evaluated:Patient Re-evaluated prior to induction Oxygen Delivery Method: Nasal Cannula

## 2017-10-04 NOTE — Telephone Encounter (Signed)
    Cathey,  I just did a TEE and cardioversion on this patient today. Please have her reduce Toprol-XL to 50 mg twice daily (call over to PACU). I already messaged you about Lasix dose adjustment. Have her get another basic metabolic panel in a week, please.   Jamesetta So

## 2017-10-04 NOTE — Interval H&P Note (Signed)
History and Physical Interval Note: No changes to history. Will proceed with TEE/DCCV as planned.  10/04/2017 9:59 AM  [name]  has presented today for surgery, with the diagnosis of a-fib  The various methods of treatment have been discussed with the patient and family. After consideration of risks, benefits and other options for treatment, the patient has consented to  Procedure(s): CARDIOVERSION (N/A) TRANSESOPHAGEAL ECHOCARDIOGRAM (TEE) WITH PROPOFOL (N/A) as a surgical intervention .  The patient's history has been reviewed, patient examined, no change in status, stable for surgery.  I have reviewed the patient's chart and labs.  Questions were answered to the patient's satisfaction.     Kate Sable

## 2017-10-04 NOTE — Telephone Encounter (Signed)
-----   Message from Herminio Commons, MD sent at 10/04/2017 10:02 AM EDT ----- About to do TEE/DCCV today on this patient. Have her alternate Lasix with 40 mg and 20 mg on alternate days.

## 2017-10-04 NOTE — Procedures (Signed)
Elective direct current cardioversion  Indication: Persistent atrial fibrillation  Description of procedure: After informed consent was obtained and preprocedure evaluation by Anesthesia, patient was taken to the procedure room. Timeout performed. Sedation was managed completely by the Anesthesia service, please refer to their documentation for details. Anterior and posterior chest pads were placed and connected to a biphasic defibrillator. A single synchronized shock of 150J was delivered resulting in restoration of sinus rhythm. The patient remained hemodynamically stable throughout, and there were no immediate complications noted. Follow-up ECG obtained.  Kenzly Rogoff, M.D., F.A.C.C.  

## 2017-10-04 NOTE — Addendum Note (Signed)
Addended by: Barbarann Ehlers A on: 10/04/2017 11:54 AM   Modules accepted: Orders

## 2017-10-04 NOTE — Anesthesia Postprocedure Evaluation (Signed)
Anesthesia Post Note  Patient: [name]  Procedure(s) Performed: CARDIOVERSION (N/A ) TRANSESOPHAGEAL ECHOCARDIOGRAM (TEE) WITH PROPOFOL (N/A )  Patient location during evaluation: Short Stay Anesthesia Type: General Level of consciousness: awake and alert and patient cooperative Pain management: satisfactory to patient Vital Signs Assessment: post-procedure vital signs reviewed and stable Respiratory status: spontaneous breathing Cardiovascular status: stable Postop Assessment: no apparent nausea or vomiting Anesthetic complications: no     Last Vitals:  Vitals:   10/04/17 1150 10/04/17 1155  BP: (!) 116/55 108/76  Pulse: 66 66  Resp: (!) 22 18  Temp:    SpO2: 98% 97%    Last Pain:  Vitals:   10/04/17 1153  TempSrc:   PainSc: 0-No pain                 [name]

## 2017-10-04 NOTE — Transfer of Care (Signed)
Immediate Anesthesia Transfer of Care Note  Patient: [name]  Procedure(s) Performed: CARDIOVERSION (N/A ) TRANSESOPHAGEAL ECHOCARDIOGRAM (TEE) WITH PROPOFOL (N/A )  Patient Location: PACU  Anesthesia Type:General  Level of Consciousness: awake and patient cooperative  Airway & Oxygen Therapy: Patient Spontanous Breathing and Patient connected to nasal cannula oxygen  Post-op Assessment: Report given to RN and Post -op Vital signs reviewed and stable  Post vital signs: Reviewed and stable  Last Vitals:  Vitals Value Taken Time  BP    Temp    Pulse    Resp    SpO2      Last Pain:  Vitals:   10/04/17 0900  TempSrc: Oral  PainSc: 2       Patients Stated Pain Goal: 6 ([id number])  Complications: No apparent anesthesia complications

## 2017-10-04 NOTE — Discharge Instructions (Addendum)
Per Dr. Jacinta Shoe, patient to resume normal medications and Toprol XL 50mg  now twice daily. Lasix will continue as alternating 40mg  and 20mg  by mouth according to swelling as previously dosed.  Further questions regarding medications call Cardiology Department   Monitored Anesthesia Care, Care After These instructions provide you with information about caring for yourself after your procedure. Your health care provider may also give you more specific instructions. Your treatment has been planned according to current medical practices, but problems sometimes occur. Call your health care provider if you have any problems or questions after your procedure. What can I expect after the procedure? After your procedure, it is common to:  Feel sleepy for several hours.  Feel clumsy and have poor balance for several hours.  Feel forgetful about what happened after the procedure.  Have poor judgment for several hours.  Feel nauseous or vomit.  Have a sore throat if you had a breathing tube during the procedure.  Follow these instructions at home: For at least 24 hours after the procedure:   Do not: ? Participate in activities in which you could fall or become injured. ? Drive. ? Use heavy machinery. ? Drink alcohol. ? Take sleeping pills or medicines that cause drowsiness. ? Make important decisions or sign legal documents. ? Take care of children on your own.  Rest. Eating and drinking  Follow the diet that is recommended by your health care provider.  If you vomit, drink water, juice, or soup when you can drink without vomiting.  Make sure you have little or no nausea before eating solid foods. General instructions  Have a responsible adult stay with you until you are awake and alert.  Take over-the-counter and prescription medicines only as told by your health care provider.  If you smoke, do not smoke without supervision.  Keep all follow-up visits as told by your health  care provider. This is important. Contact a health care provider if:  You keep feeling nauseous or you keep vomiting.  You feel light-headed.  You develop a rash.  You have a fever. Get help right away if:  You have trouble breathing. This information is not intended to replace advice given to you by your health care provider. Make sure you discuss any questions you have with your health care provider. Document Released: 07/25/2015 Document Revised: 11/24/2015 Document Reviewed: 07/25/2015 Elsevier Interactive Patient Education  2018 [employer].     Electrical Cardioversion, Care After This sheet gives you information about how to care for yourself after your procedure. Your health care provider may also give you more specific instructions. If you have problems or questions, contact your health care provider. What can I expect after the procedure? After the procedure, it is common to have:  Some redness on the skin where the shocks were given.  Follow these instructions at home:  Do not drive for 24 hours if you were given a medicine to help you relax (sedative).  Take over-the-counter and prescription medicines only as told by your health care provider.  Ask your health care provider how to check your pulse. Check it often.  Rest for 48 hours after the procedure or as told by your health care provider.  Avoid or limit your caffeine use as told by your health care provider. Contact a health care provider if:  You feel like your heart is beating too quickly or your pulse is not regular.  You have a serious muscle cramp that does not go away. Get  help right away if:  You have discomfort in your chest.  You are dizzy or you feel faint.  You have trouble breathing or you are short of breath.  Your speech is slurred.  You have trouble moving an arm or leg on one side of your body.  Your fingers or toes turn cold or blue. This information is not intended to  replace advice given to you by your health care provider. Make sure you discuss any questions you have with your health care provider. Document Released: 01/22/2013 Document Revised: 11/05/2015 Document Reviewed: 10/08/2015 Elsevier Interactive Patient Education  [employer].

## 2017-10-04 NOTE — Telephone Encounter (Signed)
Attempt to reach, [email or web address]

## 2017-10-04 NOTE — Anesthesia Procedure Notes (Signed)
Procedure Name: MAC Date/Time: 10/04/2017 10:25 AM Performed by: Vista Deck, CRNA Pre-anesthesia Checklist: Patient identified, Emergency Drugs available, Suction available, Timeout performed and Patient being monitored Patient Re-evaluated:Patient Re-evaluated prior to induction Oxygen Delivery Method: Non-rebreather mask

## 2017-10-04 NOTE — Progress Notes (Signed)
Dr. Jacinta Shoe paged to Short Stay regarding reconciliation of medications not completed.

## 2017-10-04 NOTE — Progress Notes (Signed)
*  PRELIMINARY RESULTS* Echocardiogram Echocardiogram Transesophageal has been performed.  [name] 10/04/2017, 11:10 AM

## 2017-10-09 ENCOUNTER — Encounter (HOSPITAL_COMMUNITY): Payer: Self-pay | Admitting: Cardiovascular Disease

## 2017-10-11 ENCOUNTER — Other Ambulatory Visit (HOSPITAL_COMMUNITY)
Admission: RE | Admit: 2017-10-11 | Discharge: 2017-10-11 | Disposition: A | Discharge status: Home / Self Care | Payer: Medicare Other | Source: Ambulatory Visit | Attending: Cardiovascular Disease | Admitting: Cardiovascular Disease

## 2017-10-11 DIAGNOSIS — Z79899 Other long term (current) drug therapy: Secondary | ICD-10-CM | POA: Diagnosis not present

## 2017-10-11 LAB — BASIC METABOLIC PANEL
ANION GAP: 11 (ref 5–15)
BUN: 21 mg/dL (ref 8–23)
CALCIUM: 9.2 mg/dL (ref 8.9–10.3)
CO2: 23 mmol/L (ref 22–32)
Chloride: 102 mmol/L (ref 98–111)
Creatinine, Ser: 1.53 mg/dL — ABNORMAL HIGH (ref 0.44–1.00)
GFR, EST AFRICAN AMERICAN: 35 mL/min — AB (ref >60)
GFR, EST NON AFRICAN AMERICAN: 31 mL/min — AB (ref >60)
Glucose, Bld: 181 mg/dL — ABNORMAL HIGH (ref 70–99)
Potassium: 4.2 mmol/L (ref 3.5–5.1)
SODIUM: 136 mmol/L (ref 135–145)

## 2017-10-12 ENCOUNTER — Other Ambulatory Visit (HOSPITAL_COMMUNITY): Payer: Medicare Other

## 2017-10-17 ENCOUNTER — Other Ambulatory Visit (HOSPITAL_COMMUNITY): Payer: Medicare Other

## 2017-10-26 ENCOUNTER — Other Ambulatory Visit: Payer: Self-pay | Admitting: Cardiovascular Disease

## 2017-11-14 ENCOUNTER — Encounter: Payer: Self-pay | Admitting: Cardiovascular Disease

## 2017-11-14 ENCOUNTER — Ambulatory Visit (INDEPENDENT_AMBULATORY_CARE_PROVIDER_SITE_OTHER): Payer: Medicare Other | Admitting: Cardiovascular Disease

## 2017-11-14 VITALS — BP 124/76 | HR 87 | Ht 61.0 in | Wt 158.6 lb

## 2017-11-14 DIAGNOSIS — Z955 Presence of coronary angioplasty implant and graft: Secondary | ICD-10-CM | POA: Diagnosis not present

## 2017-11-14 DIAGNOSIS — N183 Chronic kidney disease, stage 3 unspecified: Secondary | ICD-10-CM

## 2017-11-14 DIAGNOSIS — R0609 Other forms of dyspnea: Secondary | ICD-10-CM

## 2017-11-14 DIAGNOSIS — I5043 Acute on chronic combined systolic (congestive) and diastolic (congestive) heart failure: Secondary | ICD-10-CM | POA: Diagnosis not present

## 2017-11-14 DIAGNOSIS — E785 Hyperlipidemia, unspecified: Secondary | ICD-10-CM | POA: Diagnosis not present

## 2017-11-14 DIAGNOSIS — I4819 Other persistent atrial fibrillation: Secondary | ICD-10-CM

## 2017-11-14 DIAGNOSIS — I481 Persistent atrial fibrillation: Secondary | ICD-10-CM

## 2017-11-14 DIAGNOSIS — I25118 Atherosclerotic heart disease of native coronary artery with other forms of angina pectoris: Secondary | ICD-10-CM

## 2017-11-14 DIAGNOSIS — I10 Essential (primary) hypertension: Secondary | ICD-10-CM | POA: Diagnosis not present

## 2017-11-14 NOTE — Patient Instructions (Addendum)
Your physician recommends that you schedule a follow-up appointment in:4 months With Dr.Koneswaran     You have been referred to Dr Crissie Sickles, electrophysiologist for persistent a-fib    For the next 3 days: INCREASE Lasix to 40 mg twice a day, AND, INCREASE Potassium to 20 meq daily also   THEN resume regular dose    GET LAB WORK: [facility] On Friday 11/16/17      Thank you for choosing [facility] !

## 2017-11-14 NOTE — Progress Notes (Signed)
SUBJECTIVE: The patient presents for routine follow-up.  I performed a transesophageal echocardiogram and subsequent successful cardioversion for persistent atrial fibrillation on 10/04/2017.  TEE showed mild to moderately reduced left ventricular systolic function, LVEF 40 to 45%.  There was diffuse hypokinesis.  There was mild aortic and mitral regurgitation.  The left atrium was moderate to severely dilated.  There was no intracardiac thrombus.  In summary, she underwent percutaneous coronary intervention with drug-eluting stent to the left circumflex coronary artery in January 2015.  Coronary angiography 12/16/15 showed 20% stenosis in the proximal LAD and in the inferior branch of the first diagonal with a widely patent previously placed circumflex stent. RCA was small and nondominant with 80% ostial stenosis. Medical therapy was recommended.  Nuclear stress test was normal on 07/02/2017.  After undergoing cardioversion on 10/04/2017, she felt much better for about 3 weeks and then she began to experience palpitations.  Since that time she has had progressive weight gain, exertional dyspnea, and intermittent fleeting chest pains.  She is here with her daughter who lives in [place].  ECG performed in the office today which I ordered and personally interpreted demonstrates atrial fibrillation with diffuse nonspecific ST segment and T wave abnormalities, heart rate 99 bpm.    Review of Systems: As per "subjective", otherwise negative.  Allergies  Allergen Reactions  . Cephalosporins Diarrhea and Nausea Only    Lightheaded  . Levaquin [Levofloxacin In D5w] Swelling  . Macrodantin [Nitrofurantoin Macrocrystal] Swelling  . Phenothiazines Anaphylaxis and Hives  . Polysorbate Anaphylaxis  . Prednisone Shortness Of Breath  . Buspirone Itching  . Cardura [Doxazosin Mesylate] Itching  . Codeine Itching  . Acyclovir And Related Itching    Redness of skin  . Prochlorperazine  Other (See Comments)    "Upset stomach"  . Ranexa [Ranolazine]     Severe drop in BP  . Atorvastatin Hives    Cramping; tolerates Crestor ok  . Ofloxacin Rash  . Other Itching and Rash    "WOOL"= make skin look like it has been burned  . Penicillins Other (See Comments)    Causes redness all over. Has patient had a PCN reaction causing immediate rash, facial/tongue/throat swelling, SOB or lightheadedness with hypotension: No Has patient had a PCN reaction causing severe rash involving mucus membranes or skin necrosis: No Has patient had a PCN reaction that required hospitalization No Has patient had a PCN reaction occurring within the last 10 years: No If all of the above answers are "NO", then may proceed with Cephalosporin use.   . Pimozide Hives and Itching    Current Outpatient Medications  Medication Sig Dispense Refill  . acetaminophen (TYLENOL) 500 MG tablet Take 500 mg by mouth daily as needed for headache.     . allopurinol (ZYLOPRIM) 100 MG tablet Take 1 tablet (100 mg total) daily by mouth. 30 tablet 0  . ALPRAZolam (XANAX) 0.25 MG tablet Take 0.25 mg by mouth 2 (two) times daily as needed.  0  . DULoxetine (CYMBALTA) 60 MG capsule Take 60 mg by mouth at bedtime.     [name] Kitchen ELIQUIS 5 MG TABS tablet TAKE ONE TABLET BY MOUTH TWICE DAILY. 180 tablet 0  . fluticasone (FLONASE) 50 MCG/ACT nasal spray Place 2 sprays into both nostrils 2 (two) times daily as needed for allergies.     . furosemide (LASIX) 20 MG tablet Take 20 mg by mouth daily as needed for fluid. Take in addition to the 40mg  if needed  for swelling    . gabapentin (NEURONTIN) 300 MG capsule Take 1 capsule (300 mg total) by mouth 3 (three) times daily. 90 capsule 2  . hydrocortisone (ANUSOL-HC) 25 MG suppository Place 1 suppository (25 mg total) rectally every 12 (twelve) hours. (Patient taking differently: Place 25 mg rectally 2 (two) times daily as needed for hemorrhoids. ) 12 suppository 1  . ipratropium-albuterol  (DUONEB) 0.5-2.5 (3) MG/3ML SOLN Take 3 mLs by nebulization every 6 (six) hours. (Patient taking differently: Take 3 mLs by nebulization 2 (two) times daily. ) 360 mL 0  . isosorbide mononitrate (IMDUR) 60 MG 24 hr tablet Take 1 tablet (60 mg total) by mouth daily. 90 tablet 3  . lisinopril (PRINIVIL,ZESTRIL) 2.5 MG tablet Take 1 tablet (2.5 mg total) by mouth daily. 90 tablet 3  . Magnesium Oxide 400 (240 Mg) MG TABS TAKE ONE TABLET BY MOUTH DAILY. 90 tablet 3  . methocarbamol (ROBAXIN) 500 MG tablet Take 500 mg by mouth every 6 (six) hours as needed for muscle spasms.     . metoprolol (TOPROL-XL) 200 MG 24 hr tablet TAKE ONE TABLET BY MOUTH DAILY. 90 tablet 0  . metoprolol succinate (TOPROL-XL) 50 MG 24 hr tablet Take 1 tablet (50 mg total) by mouth daily. 90 tablet 3  . Multiple Vitamin (MULTIVITAMIN WITH MINERALS) TABS tablet Take 1 tablet by mouth every other day. Centrum     . Multiple Vitamins-Minerals (HAIR SKIN AND NAILS FORMULA PO) Take 1 tablet by mouth daily.    . nitroGLYCERIN (NITROSTAT) 0.4 MG SL tablet Place 0.4 mg under the tongue every 5 (five) minutes as needed for chest pain. Reported on 08/04/2015    . pantoprazole (PROTONIX) 40 MG tablet Take 40 mg by mouth daily.     . potassium chloride (K-DUR,KLOR-CON) 10 MEQ tablet TAKE TWO (2) TABLETS BY MOUTH DAILY. 180 tablet 0   No current facility-administered medications for this visit.     Past Medical History:  Diagnosis Date  . Anxiety   . Arthritis   . Atrial fibrillation ([place])   . Bursitis    Left shoulder  . Cataract   . CHF (congestive heart failure) ([place])   . COPD (chronic obstructive pulmonary disease) ([place])   . Coronary atherosclerosis of native coronary artery    a. s/p DES to LCx in 04/2013 b. cath in 11/2015 showing patent stent with 20% prox-LAD and 80% ostial RCA stenosis for which medical management was recommended due to small artery size  . Depression   . Diastolic heart failure ([facility])    EF 55-60%  .  Dysphagia, unspecified(787.20)   . Dyspnea   . Dysrhythmia   . Essential hypertension   . GERD (gastroesophageal reflux disease)    Hx Schatzki's ring, multiple EGD/ED last 01/06/2004  . Gout   . Headache   . History of anemia   . Hyperlipidemia   . Internal hemorrhoids without mention of complication   . MI (myocardial infarction) ([place]) 2006  . Microscopic colitis 2003  . Panic disorder without agoraphobia   . Paresthesia   . Pneumonia 12/2011  . PVD (peripheral vascular disease) ([place])   . S/P colonoscopy 09/27/2001   internal hemorrhoids, desc colon inflam polyp, SB BX-chronic duodenitis, colitis  . Thyroid disease     Past Surgical History:  Procedure Laterality Date  . ABDOMINAL HYSTERECTOMY    . ABDOMINAL HYSTERECTOMY    . ANTERIOR AND POSTERIOR REPAIR     with resection of vagina  . ANTERIOR  LAT LUMBAR FUSION N/A 08/01/2016   Procedure: Lumbar Two-Lumbar Five Transpsoas lateral interbody fusion with Lumbar Two-Three lateral plate fixation;  Surgeon: Kevan Ny Ditty, MD;  Location: [facility];  Service: Neurosurgery;  Laterality: N/A;  L2-5 Transpsoas lateral interbody fusion with L2-3 lateral plate fixation  . APPENDECTOMY    . BACK SURGERY    . BIOPSY  07/05/2015   Procedure: BIOPSY;  Surgeon: Daneil Dolin, MD;  [facility];  Service: Endoscopy;;  gastric polyp biopsy, ascending colon biopsy  . BLADDER SUSPENSION  11/09/2011   Procedure: TRANSVAGINAL TAPE (TVT) PROCEDURE;  Surgeon: Marissa Nestle, MD;  Location: [facility];  Service: Urology;  Laterality: N/A;  . bladder tack  06/2010  . BREAST LUMPECTOMY  1998   left, benign  . CARDIAC CATHETERIZATION    . CARDIAC CATHETERIZATION    . CARDIAC CATHETERIZATION N/A 12/16/2015   Procedure: Left Heart Cath and Coronary Angiography;  Surgeon: Troy Sine, MD;  Location: [facility];  Service: Cardiovascular;  Laterality: N/A;  . CARDIOVERSION N/A 10/04/2017   Procedure: CARDIOVERSION;  Surgeon: Herminio Commons, MD;  Location: [facility];  Service: Cardiovascular;  Laterality: N/A;  . [place]   left  . CHOLECYSTECTOMY  1998  . Cholecystectomy    . COLONOSCOPY  03/16/2011   multiple hyperplastic colon polyps, sigmoid diverticulosis, melanosis coli  . COLONOSCOPY WITH PROPOFOL N/A 07/05/2015   RMR:one 5 mm polyp in descending colon  . CORONARY ANGIOPLASTY WITH STENT PLACEMENT    . ESOPHAGEAL DILATION N/A 07/05/2015   Procedure: ESOPHAGEAL DILATION;  Surgeon: Daneil Dolin, MD;  [facility];  Service: Endoscopy;  Laterality: N/A;  . ESOPHAGOGASTRODUODENOSCOPY (EGD) WITH PROPOFOL N/A 07/05/2015   KDT:OIZTIW  . JOINT REPLACEMENT Right 2007   right knee  . left hand surgery    . LEFT HEART CATHETERIZATION WITH CORONARY ANGIOGRAM N/A 05/14/2013   Procedure: LEFT HEART CATHETERIZATION WITH CORONARY ANGIOGRAM;  Surgeon: Blane Ohara, MD;  Location: [facility] CATH LAB;  Service: Cardiovascular;  Laterality: N/A;  . left rotator cuff surgery    . LUMBAR LAMINECTOMY/DECOMPRESSION MICRODISCECTOMY N/A 10/11/2012   Procedure: LUMBAR LAMINECTOMY/DECOMPRESSION MICRODISCECTOMY 2 LEVELS;  Surgeon: Floyce Stakes, MD;  Location: [facility];  Service: Neurosurgery;  Laterality: N/A;  L3-4 L4-5 Laminectomy  . LUMBAR WOUND DEBRIDEMENT N/A 09/27/2015   Procedure: Exploration of Lumbar Wound w/ Repair CSF Leak/Lumbar Drain Placement;  Surgeon: Leeroy Cha, MD;  Location: [facility] OR[facility];  Service: Neurosurgery;  Laterality: N/A;  . MALONEY DILATION  03/16/2011   Gastritis. No H.pylori on bx. 33F maloney dilation with disruption of  occult cevical esophageal web  . NASAL SINUS SURGERY    . right knee replacement  2007  . right leg benign tumor    . SHOULDER SURGERY Left   . TEE WITHOUT CARDIOVERSION N/A 10/04/2017   Procedure: TRANSESOPHAGEAL ECHOCARDIOGRAM (TEE) WITH PROPOFOL;  Surgeon: Herminio Commons, MD;  Location: [facility];  Service: Cardiovascular;  Laterality: N/A;  .  TONSILLECTOMY    . unspecified area, hysterectomy  1972   partial    Social History   Socioeconomic History  . Marital status: Divorced    Spouse name: Not on file  . Number of children: 5  . Years of education: Not on file  . Highest education level: Not on file  Occupational History  . Occupation: retired  Scientific laboratory technician  . Financial resource strain: Not on file  .  Food insecurity:    Worry: Not on file    Inability: Not on file  . Transportation needs:    Medical: Not on file    Non-medical: Not on file  Tobacco Use  . Smoking status: Former Smoker    Packs/day: 1.00    Years: 64.00    Pack years: 64.00    Types: Cigarettes    Start date: 12/24/1947    Last attempt to quit: 11/17/2001    Years since quitting: 16.0  . Smokeless tobacco: Never Used  . Tobacco comment: Quit smoking in 2003  Substance and Sexual Activity  . Alcohol use: No    Alcohol/week: 0.0 oz  . Drug use: No  . Sexual activity: Never  Lifestyle  . Physical activity:    Days per week: Not on file    Minutes per session: Not on file  . Stress: Not on file  Relationships  . Social connections:    Talks on phone: Not on file    Gets together: Not on file    Attends religious service: Not on file    Active member of club or organization: Not on file    Attends meetings of clubs or organizations: Not on file    Relationship status: Not on file  . Intimate partner violence:    Fear of current or ex partner: Not on file    Emotionally abused: Not on file    Physically abused: Not on file    Forced sexual activity: Not on file  Other Topics Concern  . Not on file  Social History Narrative   Divorced.   Sister had colon perforation & died from complications in [place], [place]     Vitals:   11/14/17 1018  BP: 124/76  Pulse: 87  SpO2: 93%  Weight: 158 lb 9.6 oz (71.9 kg)  Height: 5\' 1"  (1.549 m)    Wt Readings from Last 3 Encounters:  11/14/17 158 lb 9.6 oz (71.9 kg)  10/02/17 152 lb (68.9 kg)    09/27/17 152 lb 6.4 oz (69.1 kg)     PHYSICAL EXAM General: NAD HEENT: Normal. Neck: No JVD, no thyromegaly. Lungs: Clear to auscultation bilaterally with normal respiratory effort. CV: Regular rate and irregular rhythm, normal S1/S2, no S3, no murmur. No pretibial or periankle edema.  Abdomen: Soft, nontender, no distention.  Neurologic: Alert and oriented.  Psych: Normal affect. Skin: Normal. Musculoskeletal: No gross deformities.    ECG: Reviewed above under Subjective   Labs: Lab Results  Component Value Date/Time   K 4.2 10/11/2017 01:44 PM   BUN 21 10/11/2017 01:44 PM   CREATININE 1.53 (H) 10/11/2017 01:44 PM   CREATININE 1.17 (H) 02/13/2011 03:26 PM   ALT 28 05/11/2017 01:16 PM   TSH 5.881 (H) 02/19/2017 07:36 PM   TSH 2.770 11/25/2013 02:14 PM   HGB 12.5 10/02/2017 02:36 PM     Lipids: Lab Results  Component Value Date/Time   LDLCALC 103 (H) 12/16/2015 04:35 AM   CHOL 177 12/16/2015 04:35 AM   TRIG 197 (H) 12/16/2015 04:35 AM   HDL 35 (L) 12/16/2015 04:35 AM       ASSESSMENT AND PLAN: 1. CAD with DES to LCxwith angina:Overall I feel her symptoms are stable. Nuclear stress test was normal in March 2019 as noted above.  Last coronary angiogram details also described above. She is currently taking Toprol-XL 250mg  daily.I willcontinueImdur 60 mg daily. Statin intolerant. She is no longer on aspirin.She is on Eliquis for atrial  fibrillation.  2. Essential HTN: Controlled. No changes to therapy.  3.Acute on chroniccombinedsystolicand diastolicheart failure, EF 40-45% (previously 55%):Weight is up 6 pounds since cardioversion.  I will increase Lasix to 40 mg twice daily for 3 days and double up on her potassium and check a basic metabolic panel within the next few days.  I will then have her resume Lasix 40 mg daily.  She is likely decompensated from reverting back to atrial fibrillation.  Cardiomyopathy is possibly tachycardia mediated.   Continue Toprol-XL and low-dose lisinopril.  I will make a referral to EP to consider antiarrhythmic drug therapy options.  4. Hyperlipidemia: Statin intolerant.  5. Aortic regurgitation: Mild at present.  6.  Long-standing persistent atrial fibrillation: She successfully underwent cardioversion on 10/04/2017 but reverted back to atrial fibrillation.  She is again experiencing symptoms from this.  Heart rate is overall controlled on metoprolol succinate 250 mg daily.  She is anticoagulated with Eliquis.  I will make a referral to EP to consider antiarrhythmic drug therapy options.  She has moderate to severe left atrial dilatation.  Given her chronic kidney disease, I would not add digoxin.  7.  Chronic kidney disease stage III: BUN 21 and creatinine 1.53 on 10/11/2017.  I will check a basic metabolic panel within the next few days given increased diuretic requirement.    Disposition: Follow up 3-4 months. FU with EP in near future.  Time spent: 40 minutes, of which greater than 50% was spent reviewing symptoms, relevant blood tests and studies, and discussing management plan with the patient.    Kate Sable, M.D., F.A.C.C.

## 2017-11-16 DIAGNOSIS — I481 Persistent atrial fibrillation: Secondary | ICD-10-CM | POA: Diagnosis not present

## 2017-11-16 LAB — BASIC METABOLIC PANEL
BUN / CREAT RATIO: 19 (calc) (ref 6–22)
BUN: 27 mg/dL — ABNORMAL HIGH (ref 7–25)
CO2: 26 mmol/L (ref 20–32)
CREATININE: 1.42 mg/dL — AB (ref 0.60–0.88)
Calcium: 9.1 mg/dL (ref 8.6–10.4)
Chloride: 102 mmol/L (ref 98–110)
GLUCOSE: 137 mg/dL (ref 65–139)
Potassium: 4.3 mmol/L (ref 3.5–5.3)
Sodium: 137 mmol/L (ref 135–146)

## 2017-11-17 IMAGING — RF DG LUMBAR SPINE 2-3V
1 series · 3 of 3 positions shown · non-contrast
Comparison: None.

CLINICAL DATA: Intraoperative surgery.

FLUOROSCOPY TIME:  42 seconds.
Images: 3
EXAM:
LUMBAR SPINE - 2-3 VIEW

[Series 1: run · 3 of 3 slices shown]
[im 1/3]
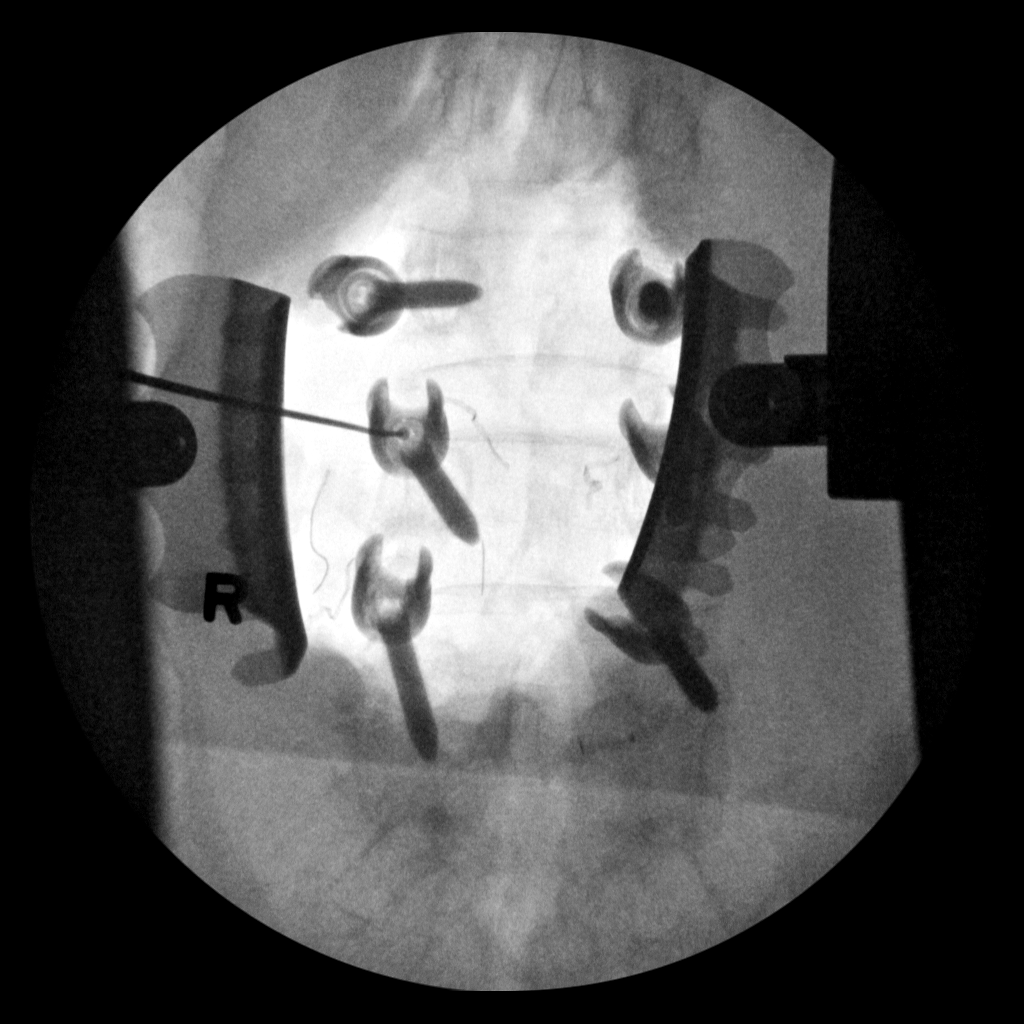
[im 2/3]
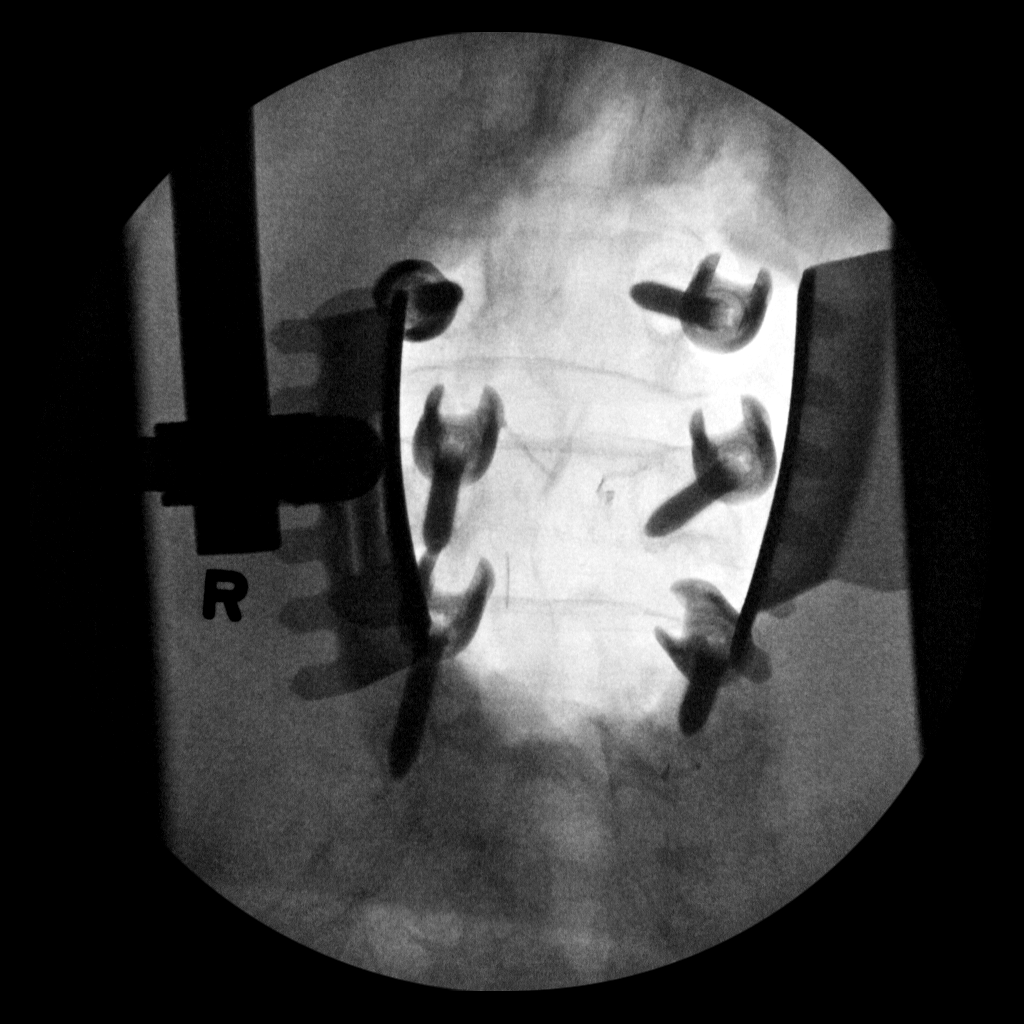
[im 3/3]
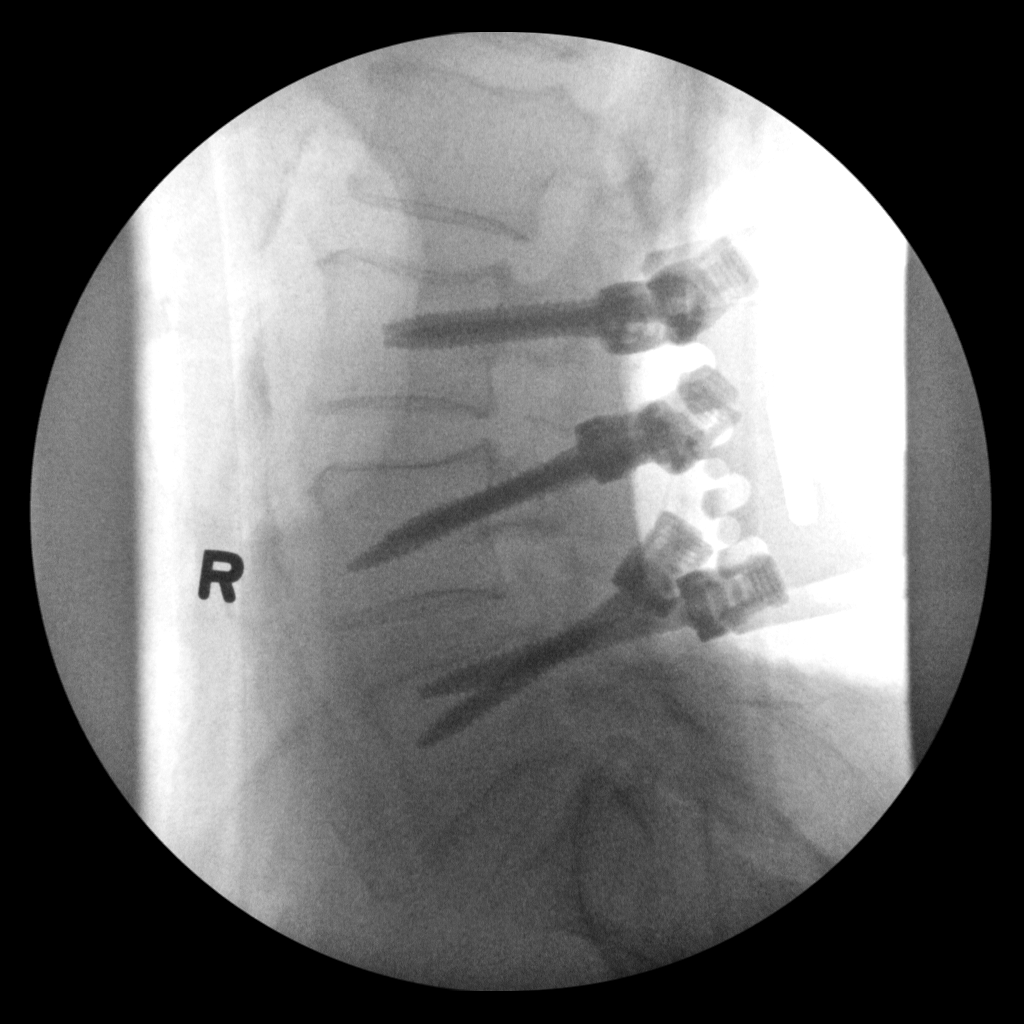

[3 of 3 positions shown; findings below may reference images not displayed]

FINDINGS: By the end of the study, pedicle screws have been placed at L3, L4,
and L5.
IMPRESSION: Pedicle screw placement as above.

## 2017-11-17 IMAGING — CR DG LUMBAR SPINE 2-3V
2 series · 2 of 2 positions shown · non-contrast
Comparison: None.

CLINICAL DATA: Surgeries at L3-4 and L4-5.

EXAM:
LUMBAR SPINE - 2-3 VIEW

[lat (1 of 2)]
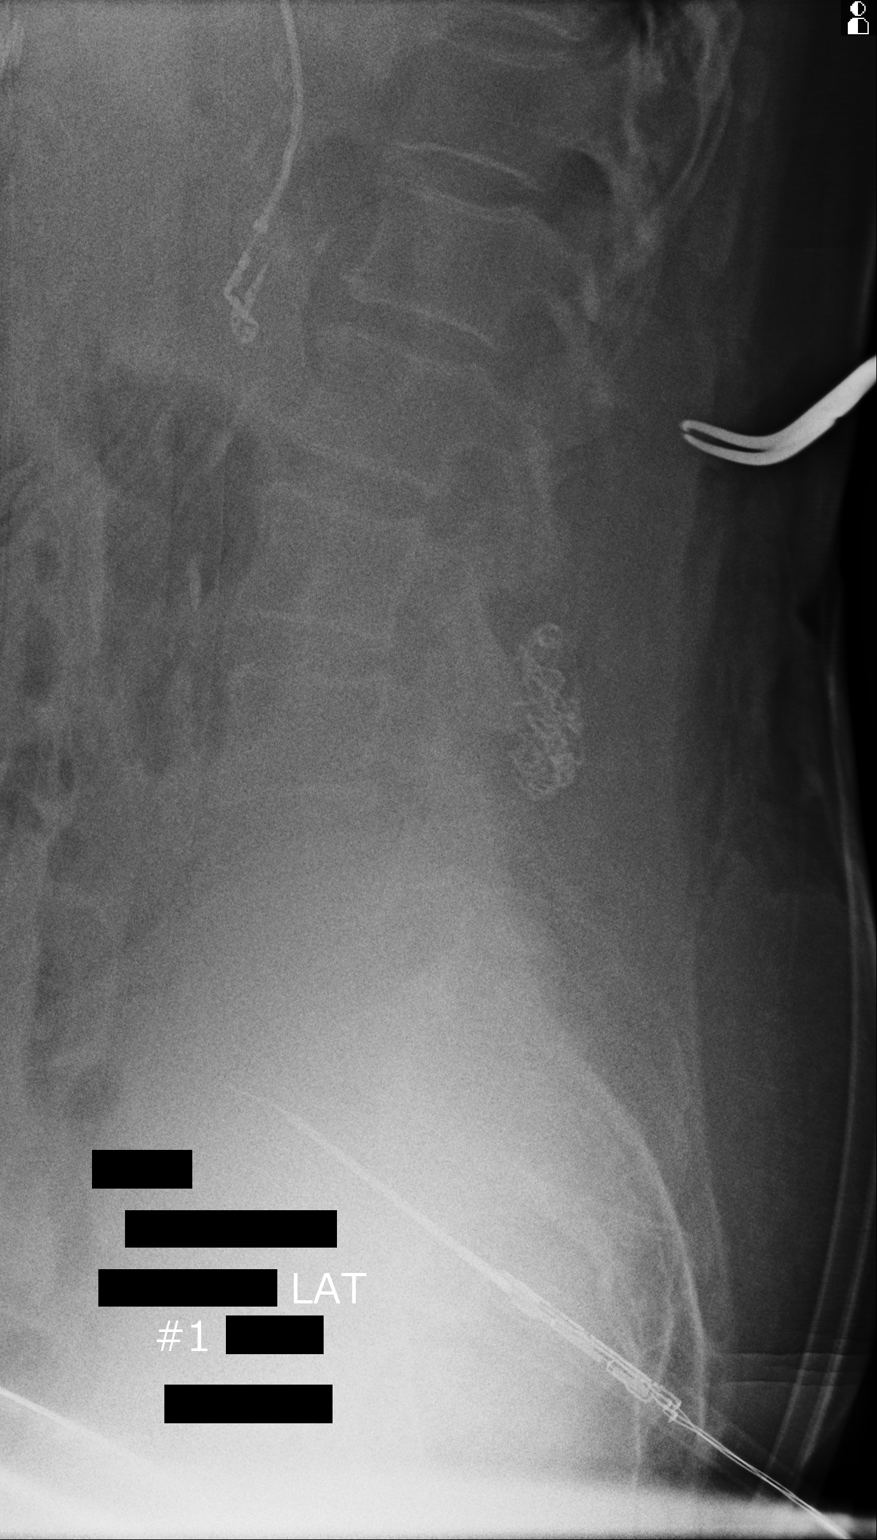

[lat (2 of 2)]
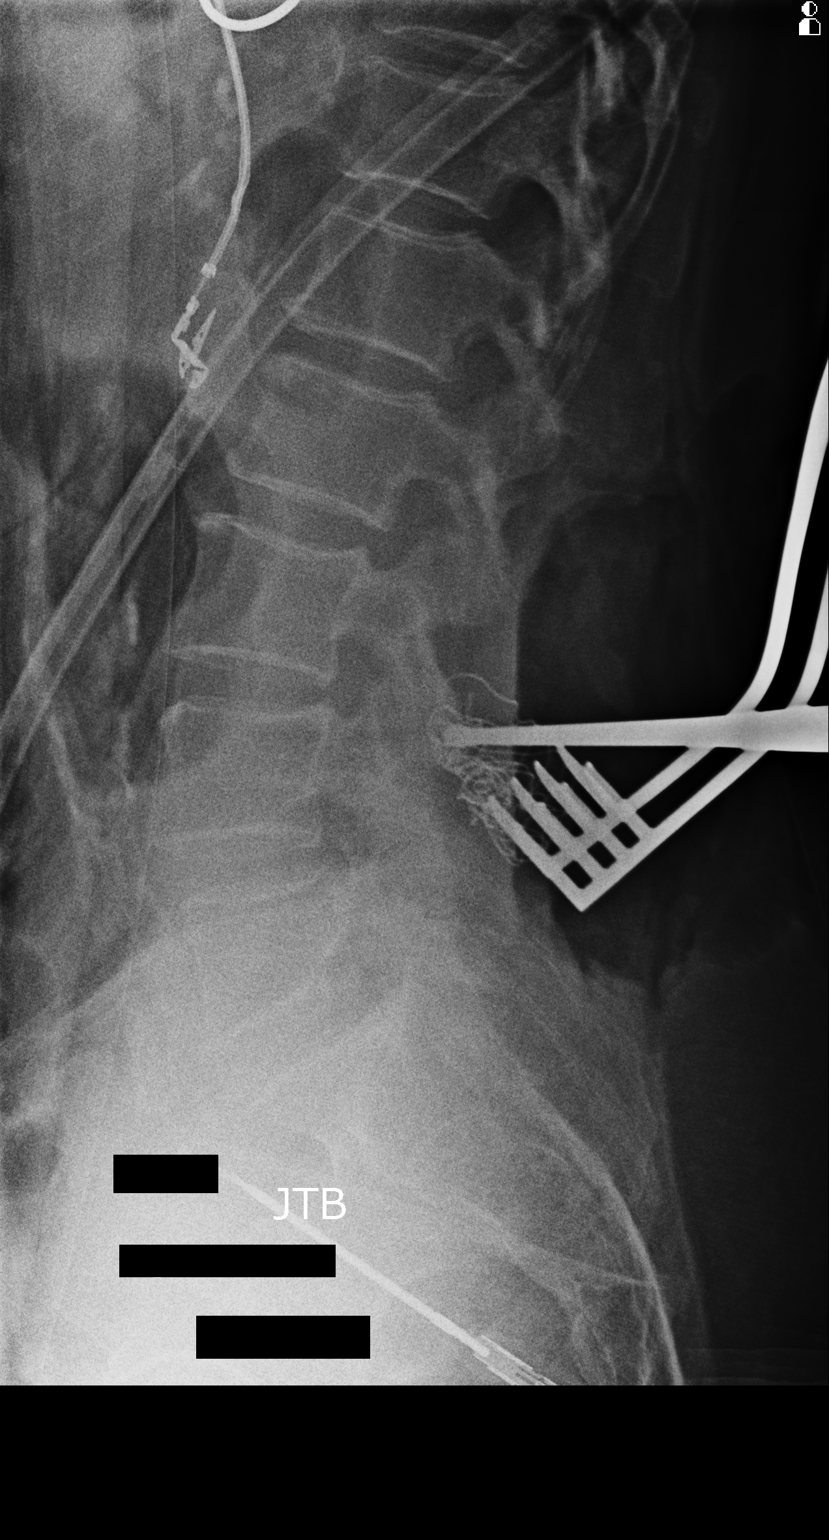

[2 of 2 positions shown; findings below may reference images not displayed]

FINDINGS: Surgical instruments are seen posteriorly throughout the study,
centered at L3-4 and L4-5. Multilevel degenerative changes.
IMPRESSION: Intraoperative films as described above.

## 2017-11-19 ENCOUNTER — Telehealth: Payer: Self-pay | Admitting: *Deleted

## 2017-11-19 NOTE — Telephone Encounter (Signed)
-----   Message from Herminio Commons, MD sent at 11/17/2017  1:14 PM EDT ----- Renal function is stable.

## 2017-11-19 NOTE — Telephone Encounter (Signed)
Called patient with test results. No answer. Left message to call back.  

## 2017-11-20 ENCOUNTER — Other Ambulatory Visit: Payer: Self-pay

## 2017-11-20 MED ORDER — NITROGLYCERIN 0.4 MG SL SUBL: 0.4000 mg | SUBLINGUAL_TABLET | SUBLINGUAL | 6 refills | Status: DC | PRN

## 2017-11-20 NOTE — Telephone Encounter (Signed)
refilled NTG per fax request

## 2017-12-08 IMAGING — DX DG CHEST 2V
2 series · 2 of 2 positions shown · non-contrast
Comparison: 02/21/2015

CLINICAL DATA: Substernal chest pain and pressure since 7752 hours,
shortness of breath, increased pain with inspiration, history
smoking, pneumonia, hypertension, coronary disease post MI, CHF

EXAM:
CHEST  2 VIEW

[chest pa]
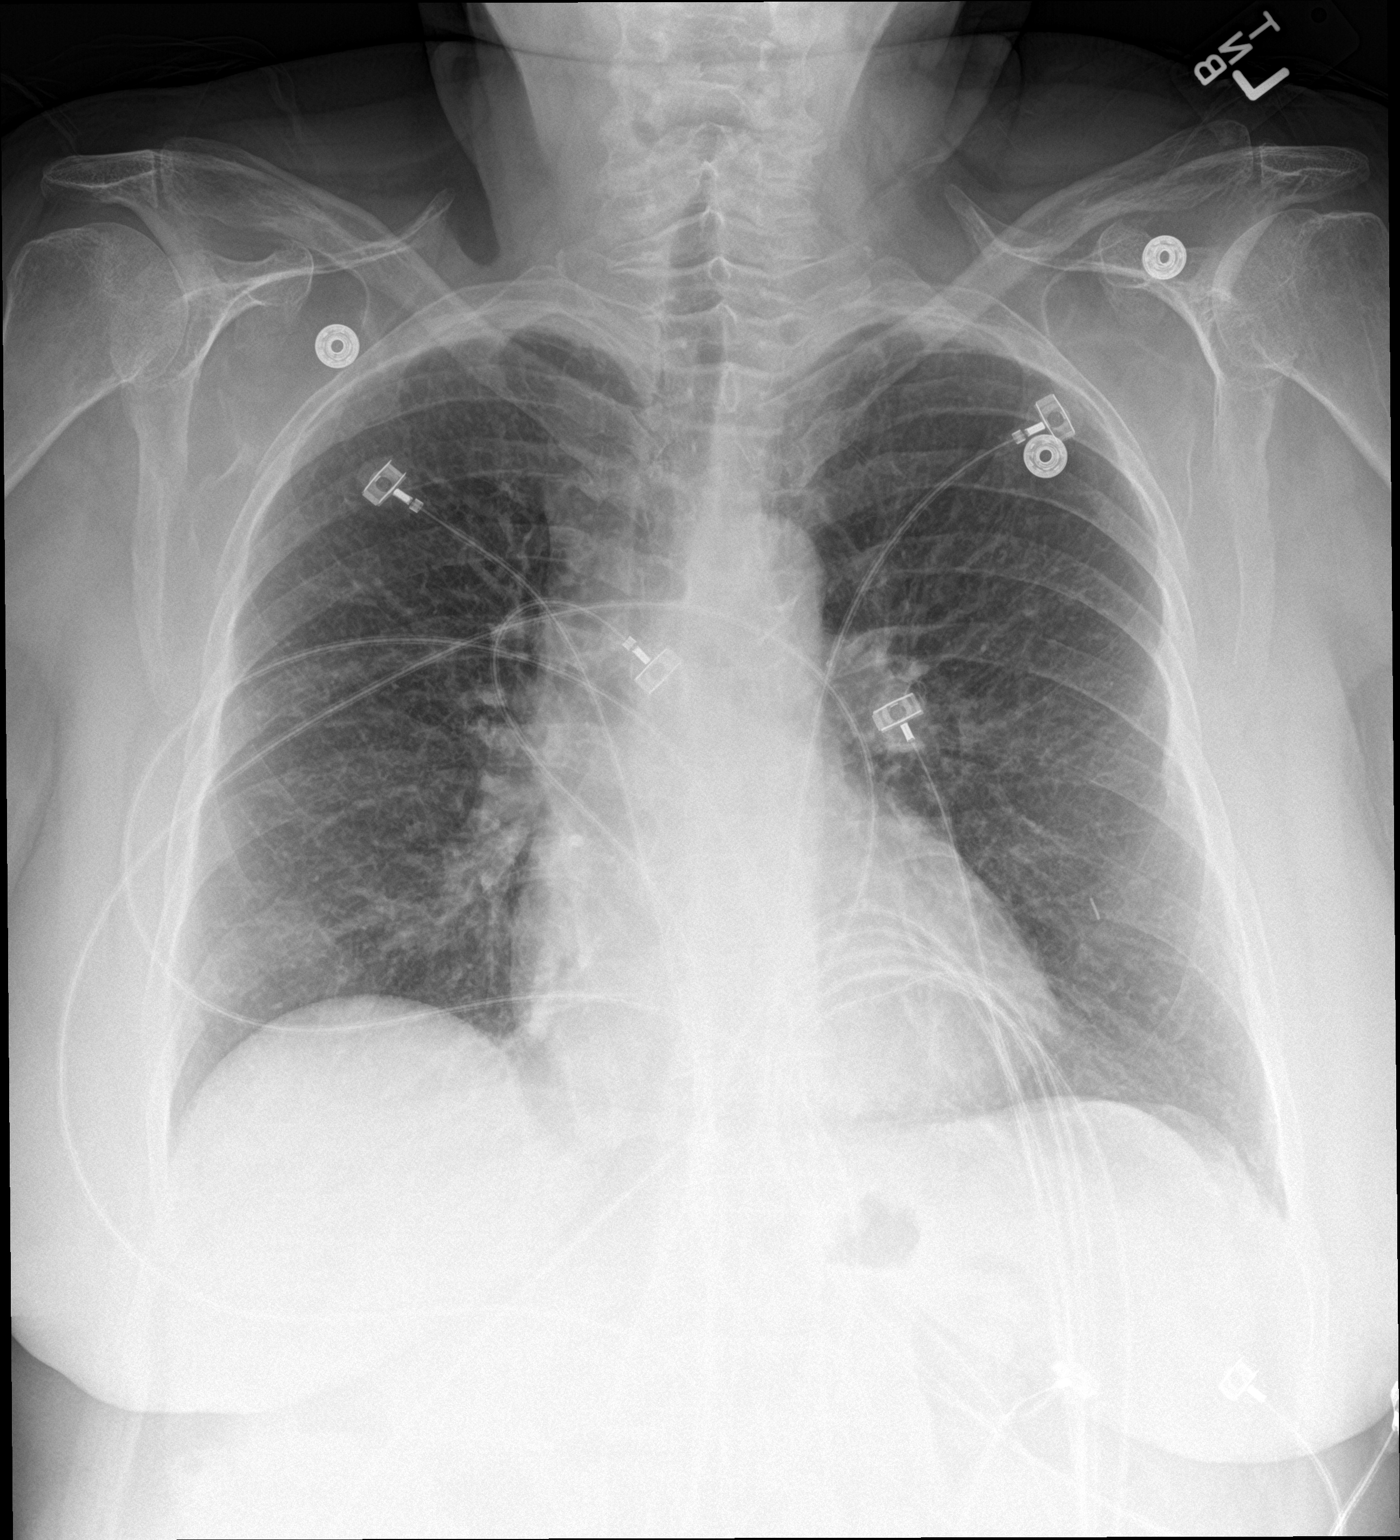

[chest lat]
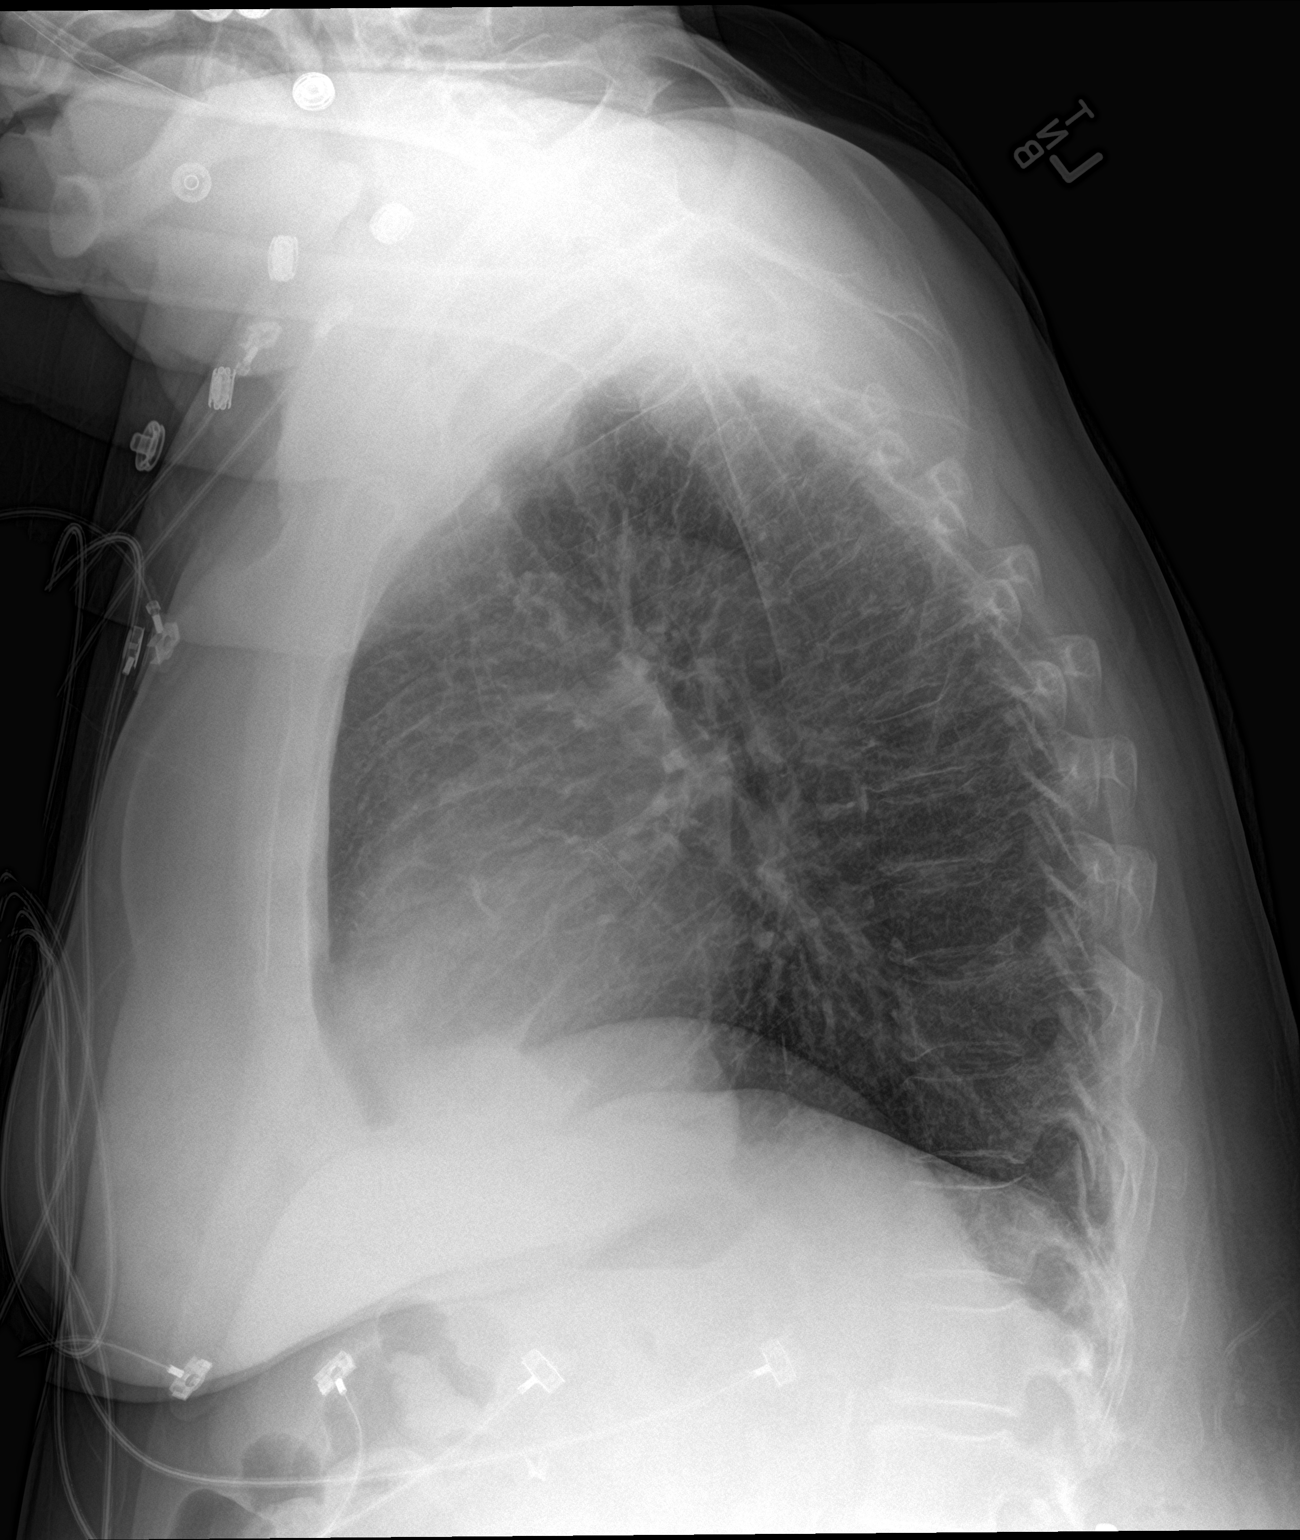

[2 of 2 positions shown; findings below may reference images not displayed]

FINDINGS: Upper normal heart size.

Atherosclerotic calcification aorta.

Mediastinal contours and pulmonary vascularity normal.

Lungs clear.

No pleural effusion or pneumothorax.

Bones demineralized.

Old healed fracture lateral LEFT sixth rib.
IMPRESSION: No acute abnormalities.

Aortic atherosclerosis.

## 2017-12-10 ENCOUNTER — Encounter: Payer: Self-pay | Admitting: Internal Medicine

## 2017-12-10 ENCOUNTER — Ambulatory Visit (INDEPENDENT_AMBULATORY_CARE_PROVIDER_SITE_OTHER): Payer: Medicare Other | Admitting: Internal Medicine

## 2017-12-10 VITALS — BP 110/58 | HR 82 | Ht 61.0 in | Wt 156.4 lb

## 2017-12-10 DIAGNOSIS — I2510 Atherosclerotic heart disease of native coronary artery without angina pectoris: Secondary | ICD-10-CM

## 2017-12-10 DIAGNOSIS — I481 Persistent atrial fibrillation: Secondary | ICD-10-CM

## 2017-12-10 DIAGNOSIS — I5042 Chronic combined systolic (congestive) and diastolic (congestive) heart failure: Secondary | ICD-10-CM

## 2017-12-10 DIAGNOSIS — I4819 Other persistent atrial fibrillation: Secondary | ICD-10-CM

## 2017-12-10 MED ORDER — AMIODARONE HCL 200 MG PO TABS: 200.0000 mg | ORAL_TABLET | Freq: Every day | ORAL | 3 refills | Status: DC

## 2017-12-10 NOTE — Patient Instructions (Addendum)
Medication Instructions:  Your physician has recommended you make the following change in your medication:  1.  Start taking amiodarone 200 mg one tablet by mouth daily.  Labwork: None ordered.  Testing/Procedures: None ordered.  Follow-Up: Your physician wants you to follow-up in: 5 weeks with Dr. Lovena Le in [place] with 12 lead EKG.  Any Other Special Instructions Will Be Listed Below (If Applicable).  If you need a refill on your cardiac medications before your next appointment, please call your pharmacy.   Amiodarone tablets What is this medicine? AMIODARONE (a MEE oh da rone) is an antiarrhythmic drug. It helps make your heart beat regularly. Because of the side effects caused by this medicine, it is only used when other medicines have not worked. It is usually used for heartbeat problems that may be life threatening. This medicine may be used for other purposes; ask your health care provider or pharmacist if you have questions. COMMON BRAND NAME(S): Cordarone, Pacerone What should I tell my health care provider before I take this medicine? They need to know if you have any of these conditions: -liver disease -lung disease -other heart problems -thyroid disease -an unusual or allergic reaction to amiodarone, iodine, other medicines, foods, dyes, or preservatives -pregnant or trying to get pregnant -breast-feeding How should I use this medicine? Take this medicine by mouth with a glass of water. Follow the directions on the prescription label. You can take this medicine with or without food. However, you should always take it the same way each time. Take your doses at regular intervals. Do not take your medicine more often than directed. Do not stop taking except on the advice of your doctor or health care professional. A special MedGuide will be given to you by the pharmacist with each prescription and refill. Be sure to read this information carefully each time. Talk to your  pediatrician regarding the use of this medicine in children. Special care may be needed. Overdosage: If you think you have taken too much of this medicine contact a poison control center or emergency room at once. NOTE: This medicine is only for you. Do not share this medicine with others. What if I miss a dose? If you miss a dose, take it as soon as you can. If it is almost time for your next dose, take only that dose. Do not take double or extra doses. What may interact with this medicine? Do not take this medicine with any of the following medications: -abarelix -apomorphine -arsenic trioxide -certain antibiotics like erythromycin, gemifloxacin, levofloxacin, pentamidine -certain medicines for depression like amoxapine, tricyclic antidepressants -certain medicines for fungal infections like fluconazole, itraconazole, ketoconazole, posaconazole, voriconazole -certain medicines for irregular heart beat like disopyramide, dofetilide, dronedarone, ibutilide, propafenone, sotalol -certain medicines for malaria like chloroquine, halofantrine -cisapride -droperidol -haloperidol -hawthorn -maprotiline -methadone -phenothiazines like chlorpromazine, mesoridazine, thioridazine -pimozide -ranolazine -red yeast rice -vardenafil -ziprasidone This medicine may also interact with the following medications: -antiviral medicines for HIV or AIDS -certain medicines for blood pressure, heart disease, irregular heart beat -certain medicines for cholesterol like atorvastatin, cerivastatin, lovastatin, simvastatin -certain medicines for hepatitis C like sofosbuvir and ledipasvir; sofosbuvir -certain medicines for seizures like phenytoin -certain medicines for thyroid problems -certain medicines that treat or prevent blood clots like warfarin -cholestyramine -cimetidine -clopidogrel -cyclosporine -dextromethorphan -diuretics -fentanyl -general anesthetics -grapefruit  juice -lidocaine -loratadine -methotrexate -other medicines that prolong the QT interval (cause an abnormal heart rhythm) -procainamide -quinidine -rifabutin, rifampin, or rifapentine -St. John's Wort -trazodone This list may not  describe all possible interactions. Give your health care provider a list of all the medicines, herbs, non-prescription drugs, or dietary supplements you use. Also tell them if you smoke, drink alcohol, or use illegal drugs. Some items may interact with your medicine. What should I watch for while using this medicine? Your condition will be monitored closely when you first begin therapy. Often, this drug is first started in a hospital or other monitored health care setting. Once you are on maintenance therapy, visit your doctor or health care professional for regular checks on your progress. Because your condition and use of this medicine carry some risk, it is a good idea to carry an identification card, necklace or bracelet with details of your condition, medications, and doctor or health care professional. Dennis Bast may get drowsy or dizzy. Do not drive, use machinery, or do anything that needs mental alertness until you know how this medicine affects you. Do not stand or sit up quickly, especially if you are an older patient. This reduces the risk of dizzy or fainting spells. This medicine can make you more sensitive to the sun. Keep out of the sun. If you cannot avoid being in the sun, wear protective clothing and use sunscreen. Do not use sun lamps or tanning beds/booths. You should have regular eye exams before and during treatment. Call your doctor if you have blurred vision, see halos, or your eyes become sensitive to light. Your eyes may get dry. It may be helpful to use a lubricating eye solution or artificial tears solution. If you are going to have surgery or a procedure that requires contrast dyes, tell your doctor or health care professional that you are taking this  medicine. What side effects may I notice from receiving this medicine? Side effects that you should report to your doctor or health care professional as soon as possible: -allergic reactions like skin rash, itching or hives, swelling of the face, lips, or tongue -blue-gray coloring of the skin -blurred vision, seeing blue green halos, increased sensitivity of the eyes to light -breathing problems -chest pain -dark urine -fast, irregular heartbeat -feeling faint or light-headed -intolerance to heat or cold -nausea or vomiting -pain and swelling of the scrotum -pain, tingling, numbness in feet, hands -redness, blistering, peeling or loosening of the skin, including inside the mouth -spitting up blood -stomach pain -sweating -unusual or uncontrolled movements of body -unusually weak or tired -weight gain or loss -yellowing of the eyes or skin Side effects that usually do not require medical attention (report to your doctor or health care professional if they continue or are bothersome): -change in sex drive or performance -constipation -dizziness -headache -loss of appetite -trouble sleeping This list may not describe all possible side effects. Call your doctor for medical advice about side effects. You may report side effects to FDA at 1-800-FDA-1088. Where should I keep my medicine? Keep out of the reach of children. Store at room temperature between 20 and 25 degrees C (68 and 77 degrees F). Protect from light. Keep container tightly closed. Throw away any unused medicine after the expiration date. NOTE: This sheet is a summary. It may not cover all possible information. If you have questions about this medicine, talk to your doctor, pharmacist, or health care provider.  2018 Elsevier/Gold Standard (2013-07-07 19:48:11)

## 2017-12-10 NOTE — Progress Notes (Signed)
HPI [name] is referred today by Dr. Jacinta Shoe for evaluation and treatment of atrial fib. She is an 84 yo woman with copd, chronic systolic and diastolic heart failure, CAD, s/p PCI, who has had atrial fib for several months. She underwent TEE guided DCCV and maintained NSR for 3 days. She felt better. She presents now for additional evaluation. Review of her ECG after DCCV in NSR demonstrates a QTC of around 460-470.  Allergies  Allergen Reactions  . Cephalosporins Diarrhea and Nausea Only    Lightheaded  . Levaquin [Levofloxacin In D5w] Swelling  . Macrodantin [Nitrofurantoin Macrocrystal] Swelling  . Phenothiazines Anaphylaxis and Hives  . Polysorbate Anaphylaxis  . Prednisone Shortness Of Breath  . Buspirone Itching  . Cardura [Doxazosin Mesylate] Itching  . Codeine Itching  . Acyclovir And Related Itching    Redness of skin  . Prochlorperazine Other (See Comments)    "Upset stomach"  . Ranexa [Ranolazine]     Severe drop in BP  . Atorvastatin Hives    Cramping; tolerates Crestor ok  . Ofloxacin Rash  . Other Itching and Rash    "WOOL"= make skin look like it has been burned  . Penicillins Other (See Comments)    Causes redness all over. Has patient had a PCN reaction causing immediate rash, facial/tongue/throat swelling, SOB or lightheadedness with hypotension: No Has patient had a PCN reaction causing severe rash involving mucus membranes or skin necrosis: No Has patient had a PCN reaction that required hospitalization No Has patient had a PCN reaction occurring within the last 10 years: No If all of the above answers are "NO", then may proceed with Cephalosporin use.   . Pimozide Hives and Itching     Current Outpatient Medications  Medication Sig Dispense Refill  . acetaminophen (TYLENOL) 500 MG tablet Take 500 mg by mouth daily as needed for headache.     . allopurinol (ZYLOPRIM) 100 MG tablet Take 1 tablet (100 mg total) daily by mouth. 30 tablet 0    . ALPRAZolam (XANAX) 0.25 MG tablet Take 0.25 mg by mouth 2 (two) times daily as needed.  0  . DULoxetine (CYMBALTA) 60 MG capsule Take 60 mg by mouth at bedtime.     [name] Kitchen ELIQUIS 5 MG TABS tablet TAKE ONE TABLET BY MOUTH TWICE DAILY. 180 tablet 0  . fluticasone (FLONASE) 50 MCG/ACT nasal spray Place 2 sprays into both nostrils 2 (two) times daily as needed for allergies.     . furosemide (LASIX) 20 MG tablet Take 20 mg by mouth daily as needed for fluid or edema.    . furosemide (LASIX) 40 MG tablet Take 40 mg by mouth daily.  3  . hydrocortisone (ANUSOL-HC) 25 MG suppository Place 25 mg rectally 2 (two) times daily as needed for hemorrhoids or anal itching.    [name] Kitchen ipratropium-albuterol (DUONEB) 0.5-2.5 (3) MG/3ML SOLN Take 3 mLs by nebulization every 6 (six) hours. 360 mL 0  . isosorbide mononitrate (IMDUR) 60 MG 24 hr tablet Take 1 tablet (60 mg total) by mouth daily. 90 tablet 3  . lisinopril (PRINIVIL,ZESTRIL) 2.5 MG tablet Take 1 tablet (2.5 mg total) by mouth daily. 90 tablet 3  . Magnesium Oxide 400 (240 Mg) MG TABS TAKE ONE TABLET BY MOUTH DAILY. 90 tablet 3  . methocarbamol (ROBAXIN) 500 MG tablet Take 500 mg by mouth every 6 (six) hours as needed for muscle spasms.     . metoprolol (TOPROL-XL) 200 MG 24 hr tablet  TAKE ONE TABLET BY MOUTH DAILY. 90 tablet 0  . metoprolol succinate (TOPROL-XL) 50 MG 24 hr tablet Take 1 tablet (50 mg total) by mouth daily. 90 tablet 3  . Multiple Vitamin (MULTIVITAMIN WITH MINERALS) TABS tablet Take 1 tablet by mouth every other day. Centrum     . nitroGLYCERIN (NITROSTAT) 0.4 MG SL tablet Place 1 tablet (0.4 mg total) under the tongue every 5 (five) minutes as needed for chest pain. Reported on 08/04/2015 25 tablet 6  . pantoprazole (PROTONIX) 40 MG tablet Take 40 mg by mouth daily.     . potassium chloride (K-DUR,KLOR-CON) 10 MEQ tablet TAKE TWO (2) TABLETS BY MOUTH DAILY. 180 tablet 0   No current facility-administered medications for this visit.       Past Medical History:  Diagnosis Date  . Anxiety   . Arthritis   . Atrial fibrillation ([place])   . Bursitis    Left shoulder  . Cataract   . CHF (congestive heart failure) ([place])   . COPD (chronic obstructive pulmonary disease) ([place])   . Coronary atherosclerosis of native coronary artery    a. s/p DES to LCx in 04/2013 b. cath in 11/2015 showing patent stent with 20% prox-LAD and 80% ostial RCA stenosis for which medical management was recommended due to small artery size  . Depression   . Diastolic heart failure ([facility])    EF 55-60%  . Dysphagia, unspecified(787.20)   . Dyspnea   . Dysrhythmia   . Essential hypertension   . GERD (gastroesophageal reflux disease)    Hx Schatzki's ring, multiple EGD/ED last 01/06/2004  . Gout   . Headache   . History of anemia   . Hyperlipidemia   . Internal hemorrhoids without mention of complication   . MI (myocardial infarction) ([place]) 2006  . Microscopic colitis 2003  . Panic disorder without agoraphobia   . Paresthesia   . Pneumonia 12/2011  . PVD (peripheral vascular disease) ([place])   . S/P colonoscopy 09/27/2001   internal hemorrhoids, desc colon inflam polyp, SB BX-chronic duodenitis, colitis  . Thyroid disease     ROS:   All systems reviewed and negative except as noted in the HPI.   Past Surgical History:  Procedure Laterality Date  . ABDOMINAL HYSTERECTOMY    . ABDOMINAL HYSTERECTOMY    . ANTERIOR AND POSTERIOR REPAIR     with resection of vagina  . ANTERIOR LAT LUMBAR FUSION N/A 08/01/2016   Procedure: Lumbar Two-Lumbar Five Transpsoas lateral interbody fusion with Lumbar Two-Three lateral plate fixation;  Surgeon: Kevan Ny Ditty, MD;  Location: [facility];  Service: Neurosurgery;  Laterality: N/A;  L2-5 Transpsoas lateral interbody fusion with L2-3 lateral plate fixation  . APPENDECTOMY    . BACK SURGERY    . BIOPSY  07/05/2015   Procedure: BIOPSY;  Surgeon: Daneil Dolin, MD;  [facility];  Service:  Endoscopy;;  gastric polyp biopsy, ascending colon biopsy  . BLADDER SUSPENSION  11/09/2011   Procedure: TRANSVAGINAL TAPE (TVT) PROCEDURE;  Surgeon: Marissa Nestle, MD;  Location: [facility];  Service: Urology;  Laterality: N/A;  . bladder tack  06/2010  . BREAST LUMPECTOMY  1998   left, benign  . CARDIAC CATHETERIZATION    . CARDIAC CATHETERIZATION    . CARDIAC CATHETERIZATION N/A 12/16/2015   Procedure: Left Heart Cath and Coronary Angiography;  Surgeon: Troy Sine, MD;  Location: [facility];  Service: Cardiovascular;  Laterality: N/A;  . CARDIOVERSION N/A 10/04/2017  Procedure: CARDIOVERSION;  Surgeon: Herminio Commons, MD;  Location: [facility];  Service: Cardiovascular;  Laterality: N/A;  . [place]   left  . CHOLECYSTECTOMY  1998  . Cholecystectomy    . COLONOSCOPY  03/16/2011   multiple hyperplastic colon polyps, sigmoid diverticulosis, melanosis coli  . COLONOSCOPY WITH PROPOFOL N/A 07/05/2015   RMR:one 5 mm polyp in descending colon  . CORONARY ANGIOPLASTY WITH STENT PLACEMENT    . ESOPHAGEAL DILATION N/A 07/05/2015   Procedure: ESOPHAGEAL DILATION;  Surgeon: Daneil Dolin, MD;  [facility];  Service: Endoscopy;  Laterality: N/A;  . ESOPHAGOGASTRODUODENOSCOPY (EGD) WITH PROPOFOL N/A 07/05/2015   AST:[id number]  . JOINT REPLACEMENT Right 2007   right knee  . left hand surgery    . LEFT HEART CATHETERIZATION WITH CORONARY ANGIOGRAM N/A 05/14/2013   Procedure: LEFT HEART CATHETERIZATION WITH CORONARY ANGIOGRAM;  Surgeon: Blane Ohara, MD;  Location: [facility] CATH LAB;  Service: Cardiovascular;  Laterality: N/A;  . left rotator cuff surgery    . LUMBAR LAMINECTOMY/DECOMPRESSION MICRODISCECTOMY N/A 10/11/2012   Procedure: LUMBAR LAMINECTOMY/DECOMPRESSION MICRODISCECTOMY 2 LEVELS;  Surgeon: Floyce Stakes, MD;  Location: [facility];  Service: Neurosurgery;  Laterality: N/A;  L3-4 L4-5 Laminectomy  . LUMBAR WOUND DEBRIDEMENT N/A 09/27/2015    Procedure: Exploration of Lumbar Wound w/ Repair CSF Leak/Lumbar Drain Placement;  Surgeon: Leeroy Cha, MD;  Location: [facility];  Service: Neurosurgery;  Laterality: N/A;  . MALONEY DILATION  03/16/2011   Gastritis. No H.pylori on bx. 72F maloney dilation with disruption of  occult cevical esophageal web  . NASAL SINUS SURGERY    . right knee replacement  2007  . right leg benign tumor    . SHOULDER SURGERY Left   . TEE WITHOUT CARDIOVERSION N/A 10/04/2017   Procedure: TRANSESOPHAGEAL ECHOCARDIOGRAM (TEE) WITH PROPOFOL;  Surgeon: Herminio Commons, MD;  Location[facility];  Service: Cardiovascular;  Laterality: N/A;  . TONSILLECTOMY    . unspecified area, hysterectomy  1972   partial     Family History  Problem Relation Age of Onset  . Stroke Mother   . Parkinson's disease Father   . Coronary artery disease Other        family Hx-sons  . Cancer Other   . Stroke Other        family Hx  . Hypertension Other        family Hx  . Diabetes Brother   . Heart disease Son        before age 70  . Diabetes Son   . Stroke Daughter 42  . Colon cancer Neg Hx      Social History   Socioeconomic History  . Marital status: Divorced    Spouse name: Not on file  . Number of children: 5  . Years of education: Not on file  . Highest education level: Not on file  Occupational History  . Occupation: retired  Scientific laboratory technician  . Financial resource strain: Not on file  . Food insecurity:    Worry: Not on file    Inability: Not on file  . Transportation needs:    Medical: Not on file    Non-medical: Not on file  Tobacco Use  . Smoking status: Former Smoker    Packs/day: 1.00    Years: 64.00    Pack years: 64.00    Types: Cigarettes    Start date: 12/24/1947    Last attempt to quit: 11/17/2001  Years since quitting: 16.0  . Smokeless tobacco: Never Used  . Tobacco comment: Quit smoking in 2003  Substance and Sexual Activity  . Alcohol use: No    Alcohol/week: 0.0 standard  drinks  . Drug use: No  . Sexual activity: Never  Lifestyle  . Physical activity:    Days per week: Not on file    Minutes per session: Not on file  . Stress: Not on file  Relationships  . Social connections:    Talks on phone: Not on file    Gets together: Not on file    Attends religious service: Not on file    Active member of club or organization: Not on file    Attends meetings of clubs or organizations: Not on file    Relationship status: Not on file  . Intimate partner violence:    Fear of current or ex partner: Not on file    Emotionally abused: Not on file    Physically abused: Not on file    Forced sexual activity: Not on file  Other Topics Concern  . Not on file  Social History Narrative   Divorced.   Sister had colon perforation & died from complications in [place], [place]     BP (!) 110/58   Pulse 82   Ht 5\' 1"  (1.549 m)   Wt 156 lb 6.4 oz (70.9 kg)   SpO2 95%   BMI 29.55 kg/m   Physical Exam:  Well appearing 84 yo woman, NAD HEENT: Unremarkable Neck:  6 cm JVD, no thyromegally Lymphatics:  No adenopathy Back:  No CVA tenderness Lungs:  Clear with no wheezes HEART:  Regular rate rhythm, no murmurs, no rubs, no clicks Abd:  soft, positive bowel sounds, no organomegally, no rebound, no guarding Ext:  2 plus pulses, no edema, no cyanosis, no clubbing Skin:  No rashes no nodules Neuro:  CN II through XII intact, motor grossly intact  EKG - atrial fib with a CVR   Assess/Plan: 1. Persistent atrial fib - I have discussed the treatment options. Her QT and CAD limit our options for rhythm control. It would be very unlikely to get her back to NSR with atrial fib ablation. She has some COPD. We will try starting low dose amiodarone. I will see her back in [place] in 4-5 weeks. If she has not reverted back to NSR, we will plan DCCV. I encouraged her not to miss any of her Eliquis. 2. CAD - she denies anginal symptoms.  3. COPD - she is not wheezing and there is  no increased work of breathing today.  Mikle Bosworth.D.

## 2017-12-30 ENCOUNTER — Encounter (HOSPITAL_COMMUNITY): Payer: Self-pay | Admitting: Emergency Medicine

## 2017-12-30 ENCOUNTER — Other Ambulatory Visit: Payer: Self-pay

## 2017-12-30 ENCOUNTER — Emergency Department (HOSPITAL_COMMUNITY): Payer: Medicare Other

## 2017-12-30 ENCOUNTER — Observation Stay (HOSPITAL_COMMUNITY)
Admission: EM | Admit: 2017-12-30 | Discharge: 2017-12-31 | Disposition: A | Discharge status: Home / Self Care | Payer: Medicare Other | Attending: Internal Medicine | Admitting: Internal Medicine

## 2017-12-30 DIAGNOSIS — Z79899 Other long term (current) drug therapy: Secondary | ICD-10-CM | POA: Diagnosis not present

## 2017-12-30 DIAGNOSIS — R0602 Shortness of breath: Secondary | ICD-10-CM | POA: Diagnosis not present

## 2017-12-30 DIAGNOSIS — J449 Chronic obstructive pulmonary disease, unspecified: Secondary | ICD-10-CM | POA: Insufficient documentation

## 2017-12-30 DIAGNOSIS — Z87891 Personal history of nicotine dependence: Secondary | ICD-10-CM | POA: Insufficient documentation

## 2017-12-30 DIAGNOSIS — I5043 Acute on chronic combined systolic (congestive) and diastolic (congestive) heart failure: Secondary | ICD-10-CM

## 2017-12-30 DIAGNOSIS — N390 Urinary tract infection, site not specified: Secondary | ICD-10-CM | POA: Diagnosis not present

## 2017-12-30 DIAGNOSIS — I5042 Chronic combined systolic (congestive) and diastolic (congestive) heart failure: Secondary | ICD-10-CM

## 2017-12-30 DIAGNOSIS — I4819 Other persistent atrial fibrillation: Secondary | ICD-10-CM

## 2017-12-30 DIAGNOSIS — Z96651 Presence of right artificial knee joint: Secondary | ICD-10-CM | POA: Diagnosis not present

## 2017-12-30 DIAGNOSIS — Z23 Encounter for immunization: Secondary | ICD-10-CM | POA: Diagnosis not present

## 2017-12-30 DIAGNOSIS — R079 Chest pain, unspecified: Principal | ICD-10-CM | POA: Insufficient documentation

## 2017-12-30 DIAGNOSIS — I481 Persistent atrial fibrillation: Secondary | ICD-10-CM | POA: Diagnosis not present

## 2017-12-30 DIAGNOSIS — N183 Chronic kidney disease, stage 3 unspecified: Secondary | ICD-10-CM | POA: Diagnosis present

## 2017-12-30 DIAGNOSIS — I200 Unstable angina: Secondary | ICD-10-CM

## 2017-12-30 DIAGNOSIS — I2510 Atherosclerotic heart disease of native coronary artery without angina pectoris: Secondary | ICD-10-CM | POA: Diagnosis not present

## 2017-12-30 DIAGNOSIS — F418 Other specified anxiety disorders: Secondary | ICD-10-CM | POA: Diagnosis not present

## 2017-12-30 DIAGNOSIS — I130 Hypertensive heart and chronic kidney disease with heart failure and stage 1 through stage 4 chronic kidney disease, or unspecified chronic kidney disease: Secondary | ICD-10-CM | POA: Insufficient documentation

## 2017-12-30 LAB — CBC WITH DIFFERENTIAL/PLATELET
Basophils Absolute: 0.1 10*3/uL (ref 0.0–0.1)
Basophils Relative: 1 %
EOS ABS: 0.3 10*3/uL (ref 0.0–0.7)
Eosinophils Relative: 4 %
HEMATOCRIT: 35 % — AB (ref 36.0–46.0)
HEMOGLOBIN: 11.6 g/dL — AB (ref 12.0–15.0)
LYMPHS ABS: 2.1 10*3/uL (ref 0.7–4.0)
Lymphocytes Relative: 28 %
MCH: 29.1 pg (ref 26.0–34.0)
MCHC: 33.1 g/dL (ref 30.0–36.0)
MCV: 87.9 fL (ref 78.0–100.0)
MONO ABS: 0.4 10*3/uL (ref 0.1–1.0)
MONOS PCT: 6 %
NEUTROS ABS: 4.7 10*3/uL (ref 1.7–7.7)
Neutrophils Relative %: 61 %
Platelets: 289 10*3/uL (ref 150–400)
RBC: 3.98 MIL/uL (ref 3.87–5.11)
RDW: 14.9 % (ref 11.5–15.5)
WBC: 7.5 10*3/uL (ref 4.0–10.5)

## 2017-12-30 LAB — COMPREHENSIVE METABOLIC PANEL
ALK PHOS: 96 U/L (ref 38–126)
ALT: 23 U/L (ref 0–44)
ANION GAP: 10 (ref 5–15)
AST: 28 U/L (ref 15–41)
Albumin: 3.7 g/dL (ref 3.5–5.0)
BILIRUBIN TOTAL: 0.5 mg/dL (ref 0.3–1.2)
BUN: 37 mg/dL — ABNORMAL HIGH (ref 8–23)
CALCIUM: 8.8 mg/dL — AB (ref 8.9–10.3)
CO2: 21 mmol/L — ABNORMAL LOW (ref 22–32)
Chloride: 104 mmol/L (ref 98–111)
Creatinine, Ser: 1.32 mg/dL — ABNORMAL HIGH (ref 0.44–1.00)
GFR calc non Af Amer: 36 mL/min — ABNORMAL LOW (ref >60)
GFR, EST AFRICAN AMERICAN: 42 mL/min — AB (ref >60)
Glucose, Bld: 180 mg/dL — ABNORMAL HIGH (ref 70–99)
POTASSIUM: 3.5 mmol/L (ref 3.5–5.1)
Sodium: 135 mmol/L (ref 135–145)
TOTAL PROTEIN: 7.1 g/dL (ref 6.5–8.1)

## 2017-12-30 LAB — URINALYSIS, COMPLETE (UACMP) WITH MICROSCOPIC
BILIRUBIN URINE: NEGATIVE
Glucose, UA: NEGATIVE mg/dL
KETONES UR: NEGATIVE mg/dL
NITRITE: POSITIVE — AB
PROTEIN: NEGATIVE mg/dL
SPECIFIC GRAVITY, URINE: 1.014 (ref 1.005–1.030)
WBC, UA: >50 WBC/hpf — ABNORMAL HIGH (ref 0–5)
pH: 5 (ref 5.0–8.0)

## 2017-12-30 LAB — I-STAT TROPONIN, ED: TROPONIN I, POC: 0 ng/mL (ref 0.00–0.08)

## 2017-12-30 LAB — TROPONIN I

## 2017-12-30 LAB — MAGNESIUM: Magnesium: 2.1 mg/dL (ref 1.7–2.4)

## 2017-12-30 MED ORDER — ISOSORBIDE MONONITRATE ER 60 MG PO TB24
60.0000 mg | ORAL_TABLET | Freq: Every day | ORAL | Status: DC
  Administered 2017-12-31: 60 mg via ORAL
  Filled 2017-12-30: qty 1

## 2017-12-30 MED ORDER — NITROGLYCERIN 0.4 MG SL SUBL: 0.4000 mg | SUBLINGUAL_TABLET | SUBLINGUAL | Status: DC | PRN

## 2017-12-30 MED ORDER — ONDANSETRON HCL 4 MG PO TABS: 4.0000 mg | ORAL_TABLET | Freq: Four times a day (QID) | ORAL | Status: DC | PRN

## 2017-12-30 MED ORDER — IPRATROPIUM-ALBUTEROL 0.5-2.5 (3) MG/3ML IN SOLN
3.0000 mL | Freq: Three times a day (TID) | RESPIRATORY_TRACT | Status: DC
  Administered 2017-12-30: 3 mL via RESPIRATORY_TRACT
  Filled 2017-12-30: qty 3

## 2017-12-30 MED ORDER — PANTOPRAZOLE SODIUM 40 MG PO TBEC
40.0000 mg | DELAYED_RELEASE_TABLET | Freq: Every day | ORAL | Status: DC
  Administered 2017-12-31: 40 mg via ORAL
  Filled 2017-12-30: qty 1

## 2017-12-30 MED ORDER — FUROSEMIDE 40 MG PO TABS
40.0000 mg | ORAL_TABLET | Freq: Every day | ORAL | Status: DC
  Administered 2017-12-31: 40 mg via ORAL
  Filled 2017-12-30: qty 1

## 2017-12-30 MED ORDER — POTASSIUM CHLORIDE CRYS ER 20 MEQ PO TBCR
20.0000 meq | EXTENDED_RELEASE_TABLET | Freq: Every day | ORAL | Status: DC
  Administered 2017-12-30 – 2017-12-31 (×2): 20 meq via ORAL
  Filled 2017-12-30 (×2): qty 1

## 2017-12-30 MED ORDER — MAGNESIUM OXIDE 400 (241.3 MG) MG PO TABS
400.0000 mg | ORAL_TABLET | Freq: Every day | ORAL | Status: DC
  Administered 2017-12-31: 400 mg via ORAL
  Filled 2017-12-30: qty 1

## 2017-12-30 MED ORDER — INFLUENZA VAC SPLIT HIGH-DOSE 0.5 ML IM SUSY
0.5000 mL | PREFILLED_SYRINGE | INTRAMUSCULAR | Status: AC
  Administered 2017-12-31: 0.5 mL via INTRAMUSCULAR
  Filled 2017-12-30: qty 0.5

## 2017-12-30 MED ORDER — APIXABAN 5 MG PO TABS
5.0000 mg | ORAL_TABLET | Freq: Two times a day (BID) | ORAL | Status: DC
  Administered 2017-12-30 – 2017-12-31 (×2): 5 mg via ORAL
  Filled 2017-12-30 (×2): qty 1

## 2017-12-30 MED ORDER — ACETAMINOPHEN 650 MG RE SUPP: 650.0000 mg | Freq: Four times a day (QID) | RECTAL | Status: DC | PRN

## 2017-12-30 MED ORDER — FLUTICASONE PROPIONATE 50 MCG/ACT NA SUSP: 2.0000 | Freq: Two times a day (BID) | NASAL | Status: DC | PRN

## 2017-12-30 MED ORDER — METHOCARBAMOL 500 MG PO TABS: 500.0000 mg | ORAL_TABLET | Freq: Four times a day (QID) | ORAL | Status: DC | PRN

## 2017-12-30 MED ORDER — ADULT MULTIVITAMIN W/MINERALS CH: 1.0000 | ORAL_TABLET | ORAL | Status: DC

## 2017-12-30 MED ORDER — LISINOPRIL 5 MG PO TABS
2.5000 mg | ORAL_TABLET | Freq: Every day | ORAL | Status: DC
  Administered 2017-12-31: 2.5 mg via ORAL
  Filled 2017-12-30: qty 1

## 2017-12-30 MED ORDER — POTASSIUM CHLORIDE CRYS ER 20 MEQ PO TBCR
20.0000 meq | EXTENDED_RELEASE_TABLET | Freq: Once | ORAL | Status: AC
  Administered 2017-12-30: 20 meq via ORAL
  Filled 2017-12-30: qty 1

## 2017-12-30 MED ORDER — ACETAMINOPHEN 325 MG PO TABS
650.0000 mg | ORAL_TABLET | Freq: Four times a day (QID) | ORAL | Status: DC | PRN
  Administered 2017-12-31: 650 mg via ORAL
  Filled 2017-12-30: qty 2

## 2017-12-30 MED ORDER — ALPRAZOLAM 0.25 MG PO TABS: 0.2500 mg | ORAL_TABLET | Freq: Two times a day (BID) | ORAL | Status: DC | PRN

## 2017-12-30 MED ORDER — AMIODARONE HCL 200 MG PO TABS
200.0000 mg | ORAL_TABLET | Freq: Every day | ORAL | Status: DC
  Administered 2017-12-31: 200 mg via ORAL
  Filled 2017-12-30: qty 1

## 2017-12-30 MED ORDER — DULOXETINE HCL 60 MG PO CPEP
60.0000 mg | ORAL_CAPSULE | Freq: Every day | ORAL | Status: DC
  Administered 2017-12-30: 60 mg via ORAL
  Filled 2017-12-30: qty 1

## 2017-12-30 MED ORDER — METOPROLOL SUCCINATE ER 50 MG PO TB24
200.0000 mg | ORAL_TABLET | Freq: Every day | ORAL | Status: DC
Start: 2017-12-31 — End: 2017-12-31
  Administered 2017-12-31: 200 mg via ORAL
  Filled 2017-12-30: qty 4

## 2017-12-30 MED ORDER — METOPROLOL SUCCINATE ER 50 MG PO TB24
50.0000 mg | ORAL_TABLET | Freq: Every day | ORAL | Status: DC
  Administered 2017-12-31: 50 mg via ORAL
  Filled 2017-12-30: qty 1

## 2017-12-30 MED ORDER — ALLOPURINOL 100 MG PO TABS
100.0000 mg | ORAL_TABLET | Freq: Every day | ORAL | Status: DC
  Administered 2017-12-31: 100 mg via ORAL
  Filled 2017-12-30: qty 1

## 2017-12-30 MED ORDER — ONDANSETRON HCL 4 MG/2ML IJ SOLN: 4.0000 mg | Freq: Four times a day (QID) | INTRAMUSCULAR | Status: DC | PRN

## 2017-12-30 NOTE — ED Provider Notes (Signed)
[facility] EMERGENCY DEPARTMENT Provider Note   CSN: [id number] Arrival date & time: 12/30/17  1116     History   Chief Complaint Chief Complaint  Patient presents with  . Chest Pain    HPI [name] is a 84 y.o. female.  Patient complains of chest discomfort.  Patient states for the last couple days she has been short of breath and having some dyspnea on exertion.  Patient has a history of stents placed in 2015  The history is provided by the patient. No language interpreter was used.  Chest Pain   This is a new problem. The current episode started more than 2 days ago. The problem occurs daily. The problem has been resolved. The pain is associated with exertion. The pain is present in the substernal region. The pain is at a severity of 6/10. The pain is moderate. The quality of the pain is described as dull. The pain does not radiate. Pertinent negatives include no abdominal pain, no back pain, no cough and no headaches.  Pertinent negatives for past medical history include no seizures.    Past Medical History:  Diagnosis Date  . Anxiety   . Arthritis   . Atrial fibrillation ([place])   . Bursitis    Left shoulder  . Cataract   . CHF (congestive heart failure) ([place])   . COPD (chronic obstructive pulmonary disease) ([place])   . Coronary atherosclerosis of native coronary artery    a. s/p DES to LCx in 04/2013 b. cath in 11/2015 showing patent stent with 20% prox-LAD and 80% ostial RCA stenosis for which medical management was recommended due to small artery size  . Depression   . Diastolic heart failure (HCC)    EF 55-60%  . Dysphagia, unspecified(787.20)   . Dyspnea   . Dysrhythmia   . Essential hypertension   . GERD (gastroesophageal reflux disease)    Hx Schatzki's ring, multiple EGD/ED last 01/06/2004  . Gout   . Headache   . History of anemia   . Hyperlipidemia   . Internal hemorrhoids without mention of complication   . MI (myocardial infarction) ([place]) 2006    . Microscopic colitis 2003  . Panic disorder without agoraphobia   . Paresthesia   . Pneumonia 12/2011  . PVD (peripheral vascular disease) ([place])   . S/P colonoscopy 09/27/2001   internal hemorrhoids, desc colon inflam polyp, SB BX-chronic duodenitis, colitis  . Thyroid disease     Patient Active Problem List   Diagnosis Date Noted  . Hyperammonemia ([place]) 02/20/2017  . Myoclonic jerking 02/20/2017  . Atrial fibrillation ([place]) 02/19/2017  . Acute gout 02/17/2017  . Chronic systolic heart failure ([place])   . Chest pain 09/30/2016  . Status post lumbar spine surgery for decompression of spinal cord 08/07/2016  . Lumbosacral spondylosis with radiculopathy 08/01/2016  . Preoperative clearance 07/05/2016  . Cough 05/26/2016  . Weakness 04/26/2016  . Nausea without vomiting 04/26/2016  . Atrial fibrillation with RVR ([place]) 04/15/2016  . DOE (dyspnea on exertion) 03/20/2016  . Hypoxia   . Acute on chronic diastolic congestive heart failure ([place])   . Chronic kidney disease, stage III (moderate) ([place]) 02/17/2016  . Essential hypertension 02/17/2016  . COPD exacerbation ([place]) 02/17/2016  . Acute bronchitis 12/18/2015  . CAD in native artery   . Chest pain at rest 10/15/2015  . Chronic low back pain 10/15/2015  . Hyponatremia 10/15/2015  . Hyperglycemia 10/15/2015  . Thrombocytosis ([place]) 10/15/2015  . Atypical chest pain  10/15/2015  . Depression   . Anxiety   . Gastroesophageal reflux disease without esophagitis   . Mild cognitive impairment 10/14/2015  . Iron deficiency anemia due to chronic blood loss   . Meningitis 10/01/2015  . Coronary artery disease due to lipid rich plaque   . NSVT (nonsustained ventricular tachycardia) ([place])   . PAF (paroxysmal atrial fibrillation) ([place])   . CSF leak 09/27/2015  . Spondylolisthesis of lumbar region 09/24/2015  . Gastric polyp   . History of colonic polyps   . Chronic diarrhea   . Esophageal dysphagia 04/01/2015  . PAOD (peripheral  arterial occlusive disease) ([place]) 03/05/2015  . Pain in the chest   . Hyperlipidemia   . Weight gain 08/12/2014  . Anemia 08/07/2014  . Hemorrhoids 08/06/2014  . CHF (congestive heart failure) ([place]) 07/29/2014  . Rectal bleeding 07/06/2014  . Constipation 07/06/2014  . Lower extremity edema 11/24/2013  . Acute kidney failure ([place]) 11/24/2013  . Hypokalemia 11/24/2013  . Other and unspecified angina pectoris 05/14/2013  . Hematochezia 02/13/2011  . Abdominal pain 02/13/2011  . Major depression ([place]) 09/28/2010  . FATIGUE 04/13/2009  . Chronic diastolic heart failure ([place]) 11/30/2008  . DYSPNEA 11/30/2008  . CAD, NATIVE VESSEL - PCI + DES to left circumflex 05/14/13 11/27/2008  . Peripheral vascular disease ([place]) 11/27/2008  . PANIC ATTACK 02/28/2008  . MI 02/28/2008  . Internal hemorrhoids 02/28/2008  . COLITIS 02/28/2008  . Dysphagia 02/28/2008    Past Surgical History:  Procedure Laterality Date  . ABDOMINAL HYSTERECTOMY    . ABDOMINAL HYSTERECTOMY    . ANTERIOR AND POSTERIOR REPAIR     with resection of vagina  . ANTERIOR LAT LUMBAR FUSION N/A 08/01/2016   Procedure: Lumbar Two-Lumbar Five Transpsoas lateral interbody fusion with Lumbar Two-Three lateral plate fixation;  Surgeon: Kevan Ny Ditty, MD;  Location: [place] [facility];  Service: Neurosurgery;  Laterality: N/A;  L2-5 Transpsoas lateral interbody fusion with L2-3 lateral plate fixation  . APPENDECTOMY    . BACK SURGERY    . BIOPSY  07/05/2015   Procedure: BIOPSY;  Surgeon: Daneil Dolin, MD;  [facility] ENDO [facility];  Service: Endoscopy;;  gastric polyp biopsy, ascending colon biopsy  . BLADDER SUSPENSION  11/09/2011   Procedure: TRANSVAGINAL TAPE (TVT) PROCEDURE;  Surgeon: Marissa Nestle, MD;  Location: [facility] ORS;  Service: Urology;  Laterality: N/A;  . bladder tack  06/2010  . BREAST LUMPECTOMY  1998   left, benign  . CARDIAC CATHETERIZATION    . CARDIAC CATHETERIZATION    . CARDIAC CATHETERIZATION N/A 12/16/2015    Procedure: Left Heart Cath and Coronary Angiography;  Surgeon: Troy Sine, MD;  Location: [place] CV LAB;  Service: Cardiovascular;  Laterality: N/A;  . CARDIOVERSION N/A 10/04/2017   Procedure: CARDIOVERSION;  Surgeon: Herminio Commons, MD;  Location: [facility] ORS;  Service: Cardiovascular;  Laterality: N/A;  . [place]   left  . CHOLECYSTECTOMY  1998  . Cholecystectomy    . COLONOSCOPY  03/16/2011   multiple hyperplastic colon polyps, sigmoid diverticulosis, melanosis coli  . COLONOSCOPY WITH PROPOFOL N/A 07/05/2015   RMR:one 5 mm polyp in descending colon  . CORONARY ANGIOPLASTY WITH STENT PLACEMENT    . ESOPHAGEAL DILATION N/A 07/05/2015   Procedure: ESOPHAGEAL DILATION;  Surgeon: Daneil Dolin, MD;  [facility];  Service: Endoscopy;  Laterality: N/A;  . ESOPHAGOGASTRODUODENOSCOPY (EGD) WITH PROPOFOL N/A 07/05/2015   [id number]  . JOINT REPLACEMENT Right 2007  right knee  . left hand surgery    . LEFT HEART CATHETERIZATION WITH CORONARY ANGIOGRAM N/A 05/14/2013   Procedure: LEFT HEART CATHETERIZATION WITH CORONARY ANGIOGRAM;  Surgeon: Blane Ohara, MD;  Location: [facility] CATH LAB;  Service: Cardiovascular;  Laterality: N/A;  . left rotator cuff surgery    . LUMBAR LAMINECTOMY/DECOMPRESSION MICRODISCECTOMY N/A 10/11/2012   Procedure: LUMBAR LAMINECTOMY/DECOMPRESSION MICRODISCECTOMY 2 LEVELS;  Surgeon: Floyce Stakes, MD;  Location: [place] NEURO ORS;  Service: Neurosurgery;  Laterality: N/A;  L3-4 L4-5 Laminectomy  . LUMBAR WOUND DEBRIDEMENT N/A 09/27/2015   Procedure: Exploration of Lumbar Wound w/ Repair CSF Leak/Lumbar Drain Placement;  Surgeon: Leeroy Cha, MD;  Location: [place] NEURO ORS;  Service: Neurosurgery;  Laterality: N/A;  . MALONEY DILATION  03/16/2011   Gastritis. No H.pylori on bx. 60F maloney dilation with disruption of  occult cevical esophageal web  . NASAL SINUS SURGERY    . right knee replacement  2007  . right leg benign tumor    .  SHOULDER SURGERY Left   . TEE WITHOUT CARDIOVERSION N/A 10/04/2017   Procedure: TRANSESOPHAGEAL ECHOCARDIOGRAM (TEE) WITH PROPOFOL;  Surgeon: Herminio Commons, MD;  Location: [facility];  Service: Cardiovascular;  Laterality: N/A;  . TONSILLECTOMY    . unspecified area, hysterectomy  1972   partial     OB History    Gravida  8   Para  6   Term      Preterm      AB  2   Living  5     SAB      TAB      Ectopic      Multiple      Live Births               Home Medications    Prior to Admission medications   Medication Sig Start Date End Date Taking? Authorizing Provider  acetaminophen (TYLENOL) 500 MG tablet Take 500 mg by mouth daily as needed for headache.    Yes [provider]  ALPRAZolam (XANAX) 0.25 MG tablet Take 0.25 mg by mouth 2 (two) times daily as needed. 09/04/16  Yes [provider]  amiodarone (PACERONE) 200 MG tablet Take 1 tablet (200 mg total) by mouth daily. 12/10/17  Yes Evans Lance, MD  DULoxetine (CYMBALTA) 60 MG capsule Take 60 mg by mouth at bedtime.    Yes [provider]  ELIQUIS 5 MG TABS tablet TAKE ONE TABLET BY MOUTH TWICE DAILY. 10/26/17  Yes Herminio Commons, MD  fluticasone (FLONASE) 50 MCG/ACT nasal spray Place 2 sprays into both nostrils 2 (two) times daily as needed for allergies.    Yes [provider]  furosemide (LASIX) 20 MG tablet Take 20 mg by mouth daily as needed for fluid or edema.   Yes [provider]  furosemide (LASIX) 40 MG tablet Take 40 mg by mouth daily. 12/03/17  Yes [provider]  ipratropium-albuterol (DUONEB) 0.5-2.5 (3) MG/3ML SOLN Take 3 mLs by nebulization every 6 (six) hours. 12/18/15  Yes Regalado, Belkys A, MD  isosorbide mononitrate (IMDUR) 60 MG 24 hr tablet Take 1 tablet (60 mg total) by mouth daily. 06/14/17  Yes Herminio Commons, MD  lisinopril (PRINIVIL,ZESTRIL) 2.5 MG tablet Take 1 tablet (2.5 mg total) by mouth daily. 09/18/17 12/30/17 Yes  Herminio Commons, MD  Magnesium Oxide 400 (240 Mg) MG TABS TAKE ONE TABLET BY MOUTH DAILY. 02/22/17  Yes Herminio Commons, MD  methocarbamol (ROBAXIN)  500 MG tablet Take 500 mg by mouth every 6 (six) hours as needed for muscle spasms.    Yes [provider]  metoprolol (TOPROL-XL) 200 MG 24 hr tablet TAKE ONE TABLET BY MOUTH DAILY. Patient taking differently: Total daily dose is 250mg  10/26/17  Yes Herminio Commons, MD  metoprolol succinate (TOPROL-XL) 50 MG 24 hr tablet Take 1 tablet (50 mg total) by mouth daily. Patient taking differently: Take 50 mg by mouth daily. Total daily dose is 250mg  09/18/17  Yes Herminio Commons, MD  Multiple Vitamin (MULTIVITAMIN WITH MINERALS) TABS tablet Take 1 tablet by mouth every other day. Centrum    Yes [provider]  nitroGLYCERIN (NITROSTAT) 0.4 MG SL tablet Place 1 tablet (0.4 mg total) under the tongue every 5 (five) minutes as needed for chest pain. Reported on 08/04/2015 11/20/17  Yes Herminio Commons, MD  pantoprazole (PROTONIX) 40 MG tablet Take 40 mg by mouth daily.    Yes [provider]  potassium chloride (K-DUR,KLOR-CON) 10 MEQ tablet TAKE TWO (2) TABLETS BY MOUTH DAILY. 10/26/17  Yes Herminio Commons, MD  allopurinol (ZYLOPRIM) 100 MG tablet Take 1 tablet (100 mg total) daily by mouth. 02/21/17 12/10/17  Murlean Iba, MD    Family History Family History  Problem Relation Age of Onset  . Stroke Mother   . Parkinson's disease Father   . Coronary artery disease Other        family Hx-sons  . Cancer Other   . Stroke Other        family Hx  . Hypertension Other        family Hx  . Diabetes Brother   . Heart disease Son        before age 68  . Diabetes Son   . Stroke Daughter 40  . Colon cancer Neg Hx     Social History Social History   Tobacco Use  . Smoking status: Former Smoker    Packs/day: 1.00    Years: 64.00    Pack years: 64.00    Types: Cigarettes    Start date: 12/24/1947      Last attempt to quit: 11/17/2001    Years since quitting: 16.1  . Smokeless tobacco: Never Used  . Tobacco comment: Quit smoking in 2003  Substance Use Topics  . Alcohol use: No    Alcohol/week: 0.0 standard drinks  . Drug use: No     Allergies   Cephalosporins; Levaquin [levofloxacin in d5w]; Macrodantin [nitrofurantoin macrocrystal]; Phenothiazines; Polysorbate; Prednisone; Buspirone; Cardura [doxazosin mesylate]; Codeine; Acyclovir and related; Prochlorperazine; Ranexa [ranolazine]; Atorvastatin; Ofloxacin; Other; Penicillins; and Pimozide   Review of Systems Review of Systems  Constitutional: Negative for appetite change and fatigue.  HENT: Negative for congestion, ear discharge and sinus pressure.   Eyes: Negative for discharge.  Respiratory: Negative for cough.   Cardiovascular: Positive for chest pain.  Gastrointestinal: Negative for abdominal pain and diarrhea.  Genitourinary: Negative for frequency and hematuria.  Musculoskeletal: Negative for back pain.  Skin: Negative for rash.  Neurological: Negative for seizures and headaches.  Psychiatric/Behavioral: Negative for hallucinations.     Physical Exam Updated Vital Signs BP 127/64   Pulse 62   Temp 98 F (36.7 C) (Oral)   Resp (!) 21   Ht 5\' 1"  (1.549 m)   Wt 71 kg   SpO2 96%   BMI 29.58 kg/m   Physical Exam  Constitutional: She is oriented to person, place, and time. She appears well-developed.  HENT:  Head: Normocephalic.  Eyes: Conjunctivae and EOM are normal. No scleral icterus.  Neck: Neck supple. No thyromegaly present.  Cardiovascular: Normal rate and regular rhythm. Exam reveals no gallop and no friction rub.  No murmur heard. Pulmonary/Chest: No stridor. She has no wheezes. She has no rales. She exhibits no tenderness.  Abdominal: She exhibits no distension. There is no tenderness. There is no rebound.  Musculoskeletal: Normal range of motion. She exhibits no edema.  Lymphadenopathy:    She  has no cervical adenopathy.  Neurological: She is oriented to person, place, and time. She exhibits normal muscle tone. Coordination normal.  Skin: No rash noted. No erythema.  Psychiatric: She has a normal mood and affect. Her behavior is normal.     ED Treatments / Results  Labs (all labs ordered are listed, but only abnormal results are displayed) Labs Reviewed  CBC WITH DIFFERENTIAL/PLATELET - Abnormal; Notable for the following components:      Result Value   Hemoglobin 11.6 (*)    HCT 35.0 (*)    All other components within normal limits  COMPREHENSIVE METABOLIC PANEL - Abnormal; Notable for the following components:   CO2 21 (*)    Glucose, Bld 180 (*)    BUN 37 (*)    Creatinine, Ser 1.32 (*)    Calcium 8.8 (*)    GFR calc non Af Amer 36 (*)    GFR calc Af Amer 42 (*)    All other components within normal limits  TROPONIN I  I-STAT TROPONIN, ED    EKG EKG Interpretation  Date/Time:  Sunday December 30 2017 11:25:53 EDT Ventricular Rate:  65 PR Interval:    QRS Duration: 99 QT Interval:  405 QTC Calculation: 422 R Axis:   6 Text Interpretation:  Atrial fibrillation Low voltage, precordial leads Nonspecific repol abnormality, diffuse leads Confirmed by Milton Ferguson [phone]) on 12/30/2017 2:10:07 PM   Radiology Dg Chest 2 View  Result Date: 12/30/2017 CLINICAL DATA:  Chest pain, shortness of breath EXAM: CHEST - 2 VIEW COMPARISON:  07/05/2017 FINDINGS: Lungs are clear.  No pleural effusion or pneumothorax. The heart is normal in size. Old left rib fracture deformities. Visualized osseous structures are otherwise within normal limits. IMPRESSION: No evidence of acute cardiopulmonary disease. Electronically Signed   By: Julian Hy M.D.   On: 12/30/2017 12:43    Procedures Procedures (including critical care time)  Medications Ordered in ED Medications - No data to display   Initial Impression / Assessment and Plan / ED Course  I have reviewed the  triage vital signs and the nursing notes.  Pertinent labs & imaging results that were available during my care of the patient were reviewed by me and considered in my medical decision making (see chart for details).     Patient with symptoms of possible unstable angina.  She will be admitted by medicine  Final Clinical Impressions(s) / ED Diagnoses   Final diagnoses:  Unstable angina [facility])    ED Discharge Orders    None       Milton Ferguson, MD 12/30/17 1517

## 2017-12-30 NOTE — H&P (Signed)
History and Physical  [name]:[id number] DOB: [birth year] DOA: 12/30/2017   PCP: Rosalee Kaufman, PA-C   Patient coming from: Home  Chief Complaint: chest pain and sob  HPI:  [name] is a 84 y.o. female with medical history of persistent atrial fibrillation, anxiety, hypertension, COPD, coronary artery disease, and hyperlipidemia presenting with 2 to 3-day history of worsening shortness of breath and chest pain.  The patient underwent TEE cardioversion on 10/04/2017 for her atrial fibrillation.  She stayed in sinus rhythm for 3 days before reverting back to atrial fibrillation.  The patient states that she felt significantly better when she was in sinus rhythm.  The patient was referred to Dr. Cristopher Peru whom she saw on 12/10/2017.  On that visit, the patient was started on amiodarone 200 mg daily.  The plan was to monitor on amiodarone with plans for repeat DCCV if the patient does not convert to sinus rhythm.  The patient states that she has sharp stabbing substernal pain radiating to her left breast that lasts approximately 10 seconds.  It is worse with any type of exertion.  She states that she has gone to the point where walking to the bathroom causes exertional dyspnea and chest discomfort.  She also has some associated dizziness, but denies any syncope.  As result, the patient presented for further evaluation.  She denies any fevers, chills, cough, hemoptysis, nausea, vomiting, diarrhea, abdominal pain.  She complains of some dysuria.  She states that whenever she has any type of exertion she feels shortness of breath with associated palpitations and chest pain.  This has become more frequent in the past 2 to 3 days. In the emergency department, patient was afebrile hemodynamically stable saturating 90% on room air.  Potassium was 3.5 with unremarkable LFTs.  WBC showed hemoglobin of 1.6 which is at her baseline.  Initial troponin was negative.  EKG showed atrial  fibrillation with nonspecific ST ST wave changes.  Chest x-ray was negative.  Assessment/Plan: Chest pain -Clinical history suggest that this likely related to the patient's atrial fibrillation -Suspect the patient may be having RVR episodes resulting in shortness of breath and chest discomfort -cardiology consult -Cycle troponins -10/04/2017 TEE EF 40-45%, diffuse HK, mild MR/AI -06/22/2017 nuclear medicine stress test--low risk, EF 60%  Coronary artery disease -Continue metoprolol succinate, Imdur -12/16/15 showed 20% stenosis in the proximal LAD and in the inferior branch of the first diagonal with a widely patent previously placed circumflex stent. RCA was small and nondominant with 80% ostial stenosis. Medical therapy was recommended.  Persistent atrial fibrillation -Continue metoprolol succinate -Rate controlled -Continue apixaban -Continue amiodarone  Essential hypertension -Continue metoprolol succinate and Imdur  Chronic combined systolic and diastolic CHF -The patient is clinically compensated -Continue home dose of furosemide 40 mg daily -Continue metoprolol succinate and lisinopril -Daily weights  CKD stage III -Baseline creatinine 1.1-1.4 -A.m. BMP  COPD -Continue duo nebs -Stable on room air  Anxiety/depression -Continue Xanax, Cymbalta        Past Medical History:  Diagnosis Date  . Anxiety   . Arthritis   . Atrial fibrillation ([place])   . Bursitis    Left shoulder  . Cataract   . CHF (congestive heart failure) ([place])   . COPD (chronic obstructive pulmonary disease) ([place])   . Coronary atherosclerosis of native coronary artery    a. s/p DES to LCx in 04/2013 b. cath in 11/2015 showing patent stent with 20% prox-LAD and  80% ostial RCA stenosis for which medical management was recommended due to small artery size  . Depression   . Diastolic heart failure (H[facility])    EF 55-60%  . Dysphagia, unspecified(787.20)   . Dyspnea   . Dysrhythmia   . Essential  hypertension   . GERD (gastroesophageal reflux disease)    Hx Schatzki's ring, multiple EGD/ED last 01/06/2004  . Gout   . Headache   . History of anemia   . Hyperlipidemia   . Internal hemorrhoids without mention of complication   . MI (myocardial infarction) ([place]) 2006  . Microscopic colitis 2003  . Panic disorder without agoraphobia   . Paresthesia   . Pneumonia 12/2011  . PVD (peripheral vascular disease) ([place])   . S/P colonoscopy 09/27/2001   internal hemorrhoids, desc colon inflam polyp, SB BX-chronic duodenitis, colitis  . Thyroid disease    Past Surgical History:  Procedure Laterality Date  . ABDOMINAL HYSTERECTOMY    . ABDOMINAL HYSTERECTOMY    . ANTERIOR AND POSTERIOR REPAIR     with resection of vagina  . ANTERIOR LAT LUMBAR FUSION N/A 08/01/2016   Procedure: Lumbar Two-Lumbar Five Transpsoas lateral interbody fusion with Lumbar Two-Three lateral plate fixation;  Surgeon: Kevan Ny Ditty, MD;  Location: [facility];  Service: Neurosurgery;  Laterality: N/A;  L2-5 Transpsoas lateral interbody fusion with L2-3 lateral plate fixation  . APPENDECTOMY    . BACK SURGERY    . BIOPSY  07/05/2015   Procedure: BIOPSY;  Surgeon: Daneil Dolin, MD;  [facility];  Service: Endoscopy;;  gastric polyp biopsy, ascending colon biopsy  . BLADDER SUSPENSION  11/09/2011   Procedure: TRANSVAGINAL TAPE (TVT) PROCEDURE;  Surgeon: Marissa Nestle, MD;  Location: [facility];  Service: Urology;  Laterality: N/A;  . bladder tack  06/2010  . BREAST LUMPECTOMY  1998   left, benign  . CARDIAC CATHETERIZATION    . CARDIAC CATHETERIZATION    . CARDIAC CATHETERIZATION N/A 12/16/2015   Procedure: Left Heart Cath and Coronary Angiography;  Surgeon: Troy Sine, MD;  Location: [facility];  Service: Cardiovascular;  Laterality: N/A;  . CARDIOVERSION N/A 10/04/2017   Procedure: CARDIOVERSION;  Surgeon: Herminio Commons, MD;  Location: [facility];  Service: Cardiovascular;  Laterality: N/A;    . [place]   left  . CHOLECYSTECTOMY  1998  . Cholecystectomy    . COLONOSCOPY  03/16/2011   multiple hyperplastic colon polyps, sigmoid diverticulosis, melanosis coli  . COLONOSCOPY WITH PROPOFOL N/A 07/05/2015   RMR:one 5 mm polyp in descending colon  . CORONARY ANGIOPLASTY WITH STENT PLACEMENT    . ESOPHAGEAL DILATION N/A 07/05/2015   Procedure: ESOPHAGEAL DILATION;  Surgeon: Daneil Dolin, MD;  [facility];  Service: Endoscopy;  Laterality: N/A;  . ESOPHAGOGASTRODUODENOSCOPY (EGD) WITH PROPOFOL N/A 07/05/2015   ZOX:WRUEAV  . JOINT REPLACEMENT Right 2007   right knee  . left hand surgery    . LEFT HEART CATHETERIZATION WITH CORONARY ANGIOGRAM N/A 05/14/2013   Procedure: LEFT HEART CATHETERIZATION WITH CORONARY ANGIOGRAM;  Surgeon: Blane Ohara, MD;  Location: [facility] CATH LAB;  Service: Cardiovascular;  Laterality: N/A;  . left rotator cuff surgery    . LUMBAR LAMINECTOMY/DECOMPRESSION MICRODISCECTOMY N/A 10/11/2012   Procedure: LUMBAR LAMINECTOMY/DECOMPRESSION MICRODISCECTOMY 2 LEVELS;  Surgeon: Floyce Stakes, MD;  Location: [place] [facility];  Service: Neurosurgery;  Laterality: N/A;  L3-4 L4-5 Laminectomy  . LUMBAR WOUND DEBRIDEMENT N/A 09/27/2015   Procedure: Exploration  of Lumbar Wound w/ Repair CSF Leak/Lumbar Drain Placement;  Surgeon: Leeroy Cha, MD;  Location: [place] [facility];  Service: Neurosurgery;  Laterality: N/A;  . MALONEY DILATION  03/16/2011   Gastritis. No H.pylori on bx. 68F maloney dilation with disruption of  occult cevical esophageal web  . NASAL SINUS SURGERY    . right knee replacement  2007  . right leg benign tumor    . SHOULDER SURGERY Left   . TEE WITHOUT CARDIOVERSION N/A 10/04/2017   Procedure: TRANSESOPHAGEAL ECHOCARDIOGRAM (TEE) WITH PROPOFOL;  Surgeon: Herminio Commons, MD;  [facility];  Service: Cardiovascular;  Laterality: N/A;  . TONSILLECTOMY    . unspecified area, hysterectomy  1972   partial   Social  History:  reports that she quit smoking about 16 years ago. Her smoking use included cigarettes. She started smoking about 70 years ago. She has a 64.00 pack-year smoking history. She has never used smokeless tobacco. She reports that she does not drink alcohol or use drugs.   Family History  Problem Relation Age of Onset  . Stroke Mother   . Parkinson's disease Father   . Coronary artery disease Other        family Hx-sons  . Cancer Other   . Stroke Other        family Hx  . Hypertension Other        family Hx  . Diabetes Brother   . Heart disease Son        before age 64  . Diabetes Son   . Stroke Daughter 5  . Colon cancer Neg Hx      Allergies  Allergen Reactions  . Cephalosporins Diarrhea and Nausea Only    Lightheaded  . Levaquin [Levofloxacin In D5w] Swelling  . Macrodantin [Nitrofurantoin Macrocrystal] Swelling  . Phenothiazines Anaphylaxis and Hives  . Polysorbate Anaphylaxis  . Prednisone Shortness Of Breath  . Buspirone Itching  . Cardura [Doxazosin Mesylate] Itching  . Codeine Itching  . Acyclovir And Related Itching    Redness of skin  . Prochlorperazine Other (See Comments)    "Upset stomach"  . Ranexa [Ranolazine]     Severe drop in BP  . Atorvastatin Hives    Cramping; tolerates Crestor ok  . Ofloxacin Rash  . Other Itching and Rash    "WOOL"= make skin look like it has been burned  . Penicillins Other (See Comments)    Causes redness all over. Has patient had a PCN reaction causing immediate rash, facial/tongue/throat swelling, SOB or lightheadedness with hypotension: No Has patient had a PCN reaction causing severe rash involving mucus membranes or skin necrosis: No Has patient had a PCN reaction that required hospitalization No Has patient had a PCN reaction occurring within the last 10 years: No If all of the above answers are "NO", then may proceed with Cephalosporin use.   . Pimozide Hives and Itching     Prior to Admission medications    Medication Sig Start Date End Date Taking? Authorizing Provider  acetaminophen (TYLENOL) 500 MG tablet Take 500 mg by mouth daily as needed for headache.    Yes [provider]  ALPRAZolam (XANAX) 0.25 MG tablet Take 0.25 mg by mouth 2 (two) times daily as needed. 09/04/16  Yes [provider]  amiodarone (PACERONE) 200 MG tablet Take 1 tablet (200 mg total) by mouth daily. 12/10/17  Yes Evans Lance, MD  DULoxetine (CYMBALTA) 60 MG capsule Take 60 mg by mouth at bedtime.  Yes [provider]  ELIQUIS 5 MG TABS tablet TAKE ONE TABLET BY MOUTH TWICE DAILY. 10/26/17  Yes Herminio Commons, MD  fluticasone (FLONASE) 50 MCG/ACT nasal spray Place 2 sprays into both nostrils 2 (two) times daily as needed for allergies.    Yes [provider]  furosemide (LASIX) 20 MG tablet Take 20 mg by mouth daily as needed for fluid or edema.   Yes [provider]  furosemide (LASIX) 40 MG tablet Take 40 mg by mouth daily. 12/03/17  Yes [provider]  ipratropium-albuterol (DUONEB) 0.5-2.5 (3) MG/3ML SOLN Take 3 mLs by nebulization every 6 (six) hours. 12/18/15  Yes Regalado, Belkys A, MD  isosorbide mononitrate (IMDUR) 60 MG 24 hr tablet Take 1 tablet (60 mg total) by mouth daily. 06/14/17  Yes Herminio Commons, MD  lisinopril (PRINIVIL,ZESTRIL) 2.5 MG tablet Take 1 tablet (2.5 mg total) by mouth daily. 09/18/17 12/30/17 Yes Herminio Commons, MD  Magnesium Oxide 400 (240 Mg) MG TABS TAKE ONE TABLET BY MOUTH DAILY. 02/22/17  Yes Herminio Commons, MD  methocarbamol (ROBAXIN) 500 MG tablet Take 500 mg by mouth every 6 (six) hours as needed for muscle spasms.    Yes [provider]  metoprolol (TOPROL-XL) 200 MG 24 hr tablet TAKE ONE TABLET BY MOUTH DAILY. Patient taking differently: Total daily dose is 250mg  10/26/17  Yes Herminio Commons, MD  metoprolol succinate (TOPROL-XL) 50 MG 24 hr tablet Take 1 tablet (50 mg total) by mouth  daily. Patient taking differently: Take 50 mg by mouth daily. Total daily dose is 250mg  09/18/17  Yes Herminio Commons, MD  Multiple Vitamin (MULTIVITAMIN WITH MINERALS) TABS tablet Take 1 tablet by mouth every other day. Centrum    Yes [provider]  nitroGLYCERIN (NITROSTAT) 0.4 MG SL tablet Place 1 tablet (0.4 mg total) under the tongue every 5 (five) minutes as needed for chest pain. Reported on 08/04/2015 11/20/17  Yes Herminio Commons, MD  pantoprazole (PROTONIX) 40 MG tablet Take 40 mg by mouth daily.    Yes [provider]  potassium chloride (K-DUR,KLOR-CON) 10 MEQ tablet TAKE TWO (2) TABLETS BY MOUTH DAILY. 10/26/17  Yes Herminio Commons, MD  allopurinol (ZYLOPRIM) 100 MG tablet Take 1 tablet (100 mg total) daily by mouth. 02/21/17 12/10/17  Murlean Iba, MD    Review of Systems:  Constitutional:  No weight loss, night sweats, Fevers, chills, fatigue.  Head&Eyes: No headache.  No vision loss.  No eye pain or scotoma ENT:  No Difficulty swallowing,Tooth/dental problems,Sore throat,  No ear ache, post nasal drip,  Cardio-vascular:  No chest pain, Orthopnea, PND, swelling in lower extremities,  dizziness, palpitations  GI:  No  abdominal pain, nausea, vomiting, diarrhea, loss of appetite, hematochezia, melena, heartburn, indigestion, Resp:   No cough. No coughing up of blood .No wheezing.No chest wall deformity  Skin:  no rash or lesions.  GU:  no dysuria, change in color of urine, no urgency or frequency. No flank pain.  Musculoskeletal:  No joint pain or swelling. No decreased range of motion. No back pain.  Psych:  No change in mood or affect. No depression or anxiety. Neurologic: No headache, no dysesthesia, no focal weakness, no vision loss. No syncope  Physical Exam: Vitals:   12/30/17 1300 12/30/17 1330 12/30/17 1400 12/30/17 1430  BP: 125/67 124/74 139/72 127/64  Pulse:      Resp: 16 20 19  (!) 21  Temp:  TempSrc:      SpO2:       Weight:      Height:       General:  A&O x 3, NAD, nontoxic, pleasant/cooperative Head/Eye: No conjunctival hemorrhage, no icterus, [employer]/AT, No nystagmus ENT:  No icterus,  No thrush, good dentition, no pharyngeal exudate Neck:  No masses, no lymphadenpathy, no bruits CV:  IRRR, no rub, no gallop, no S3 Lung:  Bibasilar rales, no wheeze Abdomen: soft/NT, +BS, nondistended, no peritoneal signs Ext: No cyanosis, No rashes, No petechiae, No lymphangitis, No edema Neuro: CNII-XII intact, strength 4/5 in bilateral upper and lower extremities, no dysmetria  Labs on Admission:  Basic Metabolic Panel: Recent Labs  Lab 12/30/17 1134  NA 135  K 3.5  CL 104  CO2 21*  GLUCOSE 180*  BUN 37*  CREATININE 1.32*  CALCIUM 8.8*   Liver Function Tests: Recent Labs  Lab 12/30/17 1134  AST 28  ALT 23  ALKPHOS 96  BILITOT 0.5  PROT 7.1  ALBUMIN 3.7   No results for input(s): LIPASE, AMYLASE in the last 168 hours. No results for input(s): AMMONIA in the last 168 hours. CBC: Recent Labs  Lab 12/30/17 1134  WBC 7.5  NEUTROABS 4.7  HGB 11.6*  HCT 35.0*  MCV 87.9  PLT 289   Coagulation Profile: No results for input(s): INR, PROTIME in the last 168 hours. Cardiac Enzymes: Recent Labs  Lab 12/30/17 1134  TROPONINI <0.03   BNP: Invalid input(s): POCBNP CBG: No results for input(s): GLUCAP in the last 168 hours. Urine analysis:    Component Value Date/Time   COLORURINE STRAW (A) 09/30/2016 1204   APPEARANCEUR CLEAR 09/30/2016 1204   LABSPEC 1.004 (L) 09/30/2016 1204   PHURINE 7.0 09/30/2016 1204   GLUCOSEU NEGATIVE 09/30/2016 1204   HGBUR NEGATIVE 09/30/2016 1204   BILIRUBINUR NEGATIVE 09/30/2016 1204   KETONESUR NEGATIVE 09/30/2016 1204   PROTEINUR NEGATIVE 09/30/2016 1204   UROBILINOGEN 0.2 02/21/2015 2028   NITRITE NEGATIVE 09/30/2016 1204   LEUKOCYTESUR SMALL (A) 09/30/2016 1204   Sepsis Labs: @LABRCNTIP (procalcitonin:4,lacticidven:4) )No results found for this  or any previous visit (from the past 240 hour(s)).   Radiological Exams on Admission: Dg Chest 2 View  Result Date: 12/30/2017 CLINICAL DATA:  Chest pain, shortness of breath EXAM: CHEST - 2 VIEW COMPARISON:  07/05/2017 FINDINGS: Lungs are clear.  No pleural effusion or pneumothorax. The heart is normal in size. Old left rib fracture deformities. Visualized osseous structures are otherwise within normal limits. IMPRESSION: No evidence of acute cardiopulmonary disease. Electronically Signed   By: Julian Hy M.D.   On: 12/30/2017 12:43    EKG: Independently reviewed. Afib, nonspecific ST-T changes    Time spent:60 minutes Code Status:   FULL Family Communication:  Daughter updated at bedside 9/15 Disposition Plan: expect 1-2 day hospitalization Consults called: cardiology DVT Prophylaxis: apixaban  Orson Eva, DO  [facility] Pager [phone]  If 7PM-7AM, please contact night-coverage [email or web address] Password [facility] 12/30/2017, 3:15 PM

## 2017-12-30 NOTE — ED Triage Notes (Signed)
Pt reports sharp pains in center of chest through to back since yesterday with shortness of breath.

## 2017-12-31 DIAGNOSIS — I5042 Chronic combined systolic (congestive) and diastolic (congestive) heart failure: Secondary | ICD-10-CM | POA: Diagnosis not present

## 2017-12-31 DIAGNOSIS — R0789 Other chest pain: Secondary | ICD-10-CM

## 2017-12-31 DIAGNOSIS — N183 Chronic kidney disease, stage 3 (moderate): Secondary | ICD-10-CM | POA: Diagnosis not present

## 2017-12-31 DIAGNOSIS — I481 Persistent atrial fibrillation: Secondary | ICD-10-CM | POA: Diagnosis not present

## 2017-12-31 DIAGNOSIS — R079 Chest pain, unspecified: Secondary | ICD-10-CM | POA: Diagnosis not present

## 2017-12-31 LAB — BASIC METABOLIC PANEL
ANION GAP: 8 (ref 5–15)
BUN: 32 mg/dL — AB (ref 8–23)
CHLORIDE: 106 mmol/L (ref 98–111)
CO2: 25 mmol/L (ref 22–32)
Calcium: 8.9 mg/dL (ref 8.9–10.3)
Creatinine, Ser: 1.44 mg/dL — ABNORMAL HIGH (ref 0.44–1.00)
GFR calc Af Amer: 38 mL/min — ABNORMAL LOW (ref >60)
GFR calc non Af Amer: 33 mL/min — ABNORMAL LOW (ref >60)
GLUCOSE: 107 mg/dL — AB (ref 70–99)
POTASSIUM: 4.3 mmol/L (ref 3.5–5.1)
Sodium: 139 mmol/L (ref 135–145)

## 2017-12-31 LAB — CBC
HEMATOCRIT: 35.2 % — AB (ref 36.0–46.0)
Hemoglobin: 11.4 g/dL — ABNORMAL LOW (ref 12.0–15.0)
MCH: 28.7 pg (ref 26.0–34.0)
MCHC: 32.4 g/dL (ref 30.0–36.0)
MCV: 88.7 fL (ref 78.0–100.0)
Platelets: 271 10*3/uL (ref 150–400)
RBC: 3.97 MIL/uL (ref 3.87–5.11)
RDW: 14.9 % (ref 11.5–15.5)
WBC: 7.4 10*3/uL (ref 4.0–10.5)

## 2017-12-31 MED ORDER — ISOSORBIDE MONONITRATE ER 60 MG PO TB24: 30.0000 mg | ORAL_TABLET | Freq: Every day | ORAL | Status: DC

## 2017-12-31 MED ORDER — CEFDINIR 300 MG PO CAPS
300.0000 mg | ORAL_CAPSULE | Freq: Two times a day (BID) | ORAL | Status: DC
  Administered 2017-12-31: 300 mg via ORAL
  Filled 2017-12-31: qty 1

## 2017-12-31 MED ORDER — CEFDINIR 300 MG PO CAPS: 300.0000 mg | ORAL_CAPSULE | Freq: Two times a day (BID) | ORAL | 0 refills | Status: DC

## 2017-12-31 MED ORDER — IPRATROPIUM-ALBUTEROL 0.5-2.5 (3) MG/3ML IN SOLN
3.0000 mL | Freq: Three times a day (TID) | RESPIRATORY_TRACT | Status: DC
  Administered 2017-12-31: 3 mL via RESPIRATORY_TRACT
  Filled 2017-12-31: qty 3

## 2017-12-31 MED ORDER — ISOSORBIDE MONONITRATE ER 30 MG PO TB24: 30.0000 mg | ORAL_TABLET | Freq: Every day | ORAL | 1 refills | Status: DC

## 2017-12-31 NOTE — Discharge Summary (Addendum)
Physician Discharge Summary  [name] ZOX:[id number] DOB: Feb 01, 1933 DOA: 12/30/2017  PCP: Tonya Kaufman, PA-C  Admit date: 12/30/2017 Discharge date: 12/31/2017  Admitted From: Home Disposition:  Home   Recommendations for Outpatient Follow-up:  1. Follow up with PCP in 1-2 weeks 2. Please obtain BMP/CBC in one week 3. Please follow up on urine culture results and adjust abx accordingly     Discharge Condition: Stable CODE STATUS: FULL Diet recommendation: Heart Healthy   Brief/Interim Summary: 84 y.o. female with medical history of persistent atrial fibrillation, anxiety, hypertension, COPD, coronary artery disease, and hyperlipidemia presenting with 2 to 3-day history of worsening shortness of breath and chest pain.  The patient underwent TEE cardioversion on 10/04/2017 for her atrial fibrillation.  She stayed in sinus rhythm for 3 days before reverting back to atrial fibrillation.  The patient states that she felt significantly better when she was in sinus rhythm.  The patient was referred to Dr. Cristopher [name] whom she saw on 12/10/2017.  On that visit, the patient was started on amiodarone 200 mg daily.  The plan was to monitor on amiodarone with plans for repeat DCCV if the patient does not convert to sinus rhythm.  The patient states that she has sharp stabbing substernal pain radiating to her left breast that lasts approximately 10 seconds.  It is worse with any type of exertion.  She states that she has gone to the point where walking to the bathroom causes exertional dyspnea and chest discomfort.  She also has some associated dizziness, but denies any syncope.  As result, the patient presented for further evaluation.  troponins have been neg.  Afib rates controlled during hospitalization.  Cardiology consulted for assistance-->no further ischemic testing.  Discharge Diagnoses:  Chest pain -Clinical history suggest that this likely related to the patient's atrial  fibrillation -Suspect the patient may be having RVR episodes resulting in shortness of breath and chest discomfort -cardiology consult appreciated-->no further ischemic testing; follow up with Dr. Lovena [name] 01/17/18;  afib likely the cause of symptoms -Cycle troponins--neg x 3 -10/04/2017 TEE EF 40-45%, diffuse HK, mild MR/AI -06/22/2017 nuclear medicine stress test--low risk, EF 60%  Coronary artery disease -Continue metoprolol succinate, Imdur -12/16/15 showed 20% stenosis in the proximal LAD and in the inferior branch of the first diagonal with a widely patent previously placed circumflex stent. RCA was small and nondominant with 80% ostial stenosis. Medical therapy was recommended.  Persistent atrial fibrillation -Continue metoprolol succinate -Rate controlled throughout hospitalization -Continue apixaban -Continue amiodarone -mag 2.1; K = 4.3  Essential hypertension -Continue metoprolol succinate and Imdur -imdur dose reduced to 30 mg daily due to headache  Chronic combined systolic and diastolic CHF -The patient is clinically compensated -Continue home dose of furosemide 40 mg daily -Continue metoprolol succinate and lisinopril -Daily weights  CKD stage III -Baseline creatinine 1.1-1.4 -A.m. BMP  COPD -Continue duo nebs -Stable on room air  Anxiety/depression -Continue Xanax, Cymbalta  UTI -UA with >50 WBC -start empiric cefdinir x 5 days -follow up on urine culture     Discharge Instructions   Allergies as of 12/31/2017      Reactions   Cephalosporins Diarrhea, Nausea Only   Lightheaded   Levaquin [levofloxacin In D5w] Swelling   Macrodantin [nitrofurantoin Macrocrystal] Swelling   Phenothiazines Anaphylaxis, Hives   Polysorbate Anaphylaxis   Prednisone Shortness Of Breath   Buspirone Itching   Cardura [doxazosin Mesylate] Itching   Codeine Itching   Acyclovir And Related Itching   Redness  of skin   Prochlorperazine Other (See Comments)   "Upset  stomach"   Ranexa [ranolazine]    Severe drop in BP   Atorvastatin Hives   Cramping; tolerates Crestor ok   Ofloxacin Rash   Other Itching, Rash   "WOOL"= make skin look like it has been burned   Penicillins Other (See Comments)   Causes redness all over. Has patient had a PCN reaction causing immediate rash, facial/tongue/throat swelling, SOB or lightheadedness with hypotension: No Has patient had a PCN reaction causing severe rash involving mucus membranes or skin necrosis: No Has patient had a PCN reaction that required hospitalization No Has patient had a PCN reaction occurring within the last 10 years: No If all of the above answers are "NO", then may proceed with Cephalosporin use.   Pimozide Hives, Itching      Medication List    TAKE these medications   acetaminophen 500 MG tablet Commonly known as:  TYLENOL Take 500 mg by mouth daily as needed for headache.   allopurinol 100 MG tablet Commonly known as:  ZYLOPRIM Take 1 tablet (100 mg total) daily by mouth.   ALPRAZolam 0.25 MG tablet Commonly known as:  XANAX Take 0.25 mg by mouth 2 (two) times daily as needed.   amiodarone 200 MG tablet Commonly known as:  PACERONE Take 1 tablet (200 mg total) by mouth daily.   cefdinir 300 MG capsule Commonly known as:  OMNICEF Take 1 capsule (300 mg total) by mouth every 12 (twelve) hours.   DULoxetine 60 MG capsule Commonly known as:  CYMBALTA Take 60 mg by mouth at bedtime.   ELIQUIS 5 MG Tabs tablet Generic drug:  apixaban TAKE ONE TABLET BY MOUTH TWICE DAILY.   fluticasone 50 MCG/ACT nasal spray Commonly known as:  FLONASE Place 2 sprays into both nostrils 2 (two) times daily as needed for allergies.   furosemide 20 MG tablet Commonly known as:  LASIX Take 20 mg by mouth daily as needed for fluid or edema.   furosemide 40 MG tablet Commonly known as:  LASIX Take 40 mg by mouth daily.   ipratropium-albuterol 0.5-2.5 (3) MG/3ML Soln Commonly known as:   DUONEB Take 3 mLs by nebulization every 6 (six) hours.   isosorbide mononitrate 30 MG 24 hr tablet Commonly known as:  IMDUR Take 1 tablet (30 mg total) by mouth daily. Start taking on:  01/01/2018 What changed:    medication strength  how much to take   lisinopril 2.5 MG tablet Commonly known as:  PRINIVIL,ZESTRIL Take 1 tablet (2.5 mg total) by mouth daily.   Magnesium Oxide 400 (240 Mg) MG Tabs TAKE ONE TABLET BY MOUTH DAILY.   methocarbamol 500 MG tablet Commonly known as:  ROBAXIN Take 500 mg by mouth every 6 (six) hours as needed for muscle spasms.   metoprolol succinate 50 MG 24 hr tablet Commonly known as:  TOPROL-XL Take 1 tablet (50 mg total) by mouth daily. What changed:  additional instructions   metoprolol 200 MG 24 hr tablet Commonly known as:  TOPROL-XL TAKE ONE TABLET BY MOUTH DAILY. What changed:    how much to take  how to take this  when to take this  additional instructions   multivitamin with minerals Tabs tablet Take 1 tablet by mouth every other day. Centrum   nitroGLYCERIN 0.4 MG SL tablet Commonly known as:  NITROSTAT Place 1 tablet (0.4 mg total) under the tongue every 5 (five) minutes as needed for chest pain. Reported  on 08/04/2015   pantoprazole 40 MG tablet Commonly known as:  PROTONIX Take 40 mg by mouth daily.   potassium chloride 10 MEQ tablet Commonly known as:  K-DUR,KLOR-CON TAKE TWO (2) TABLETS BY MOUTH DAILY.       Allergies  Allergen Reactions  . Cephalosporins Diarrhea and Nausea Only    Lightheaded  . Levaquin [Levofloxacin In D5w] Swelling  . Macrodantin [Nitrofurantoin Macrocrystal] Swelling  . Phenothiazines Anaphylaxis and Hives  . Polysorbate Anaphylaxis  . Prednisone Shortness Of Breath  . Buspirone Itching  . Cardura [Doxazosin Mesylate] Itching  . Codeine Itching  . Acyclovir And Related Itching    Redness of skin  . Prochlorperazine Other (See Comments)    "Upset stomach"  . Ranexa [Ranolazine]      Severe drop in BP  . Atorvastatin Hives    Cramping; tolerates Crestor ok  . Ofloxacin Rash  . Other Itching and Rash    "WOOL"= make skin look like it has been burned  . Penicillins Other (See Comments)    Causes redness all over. Has patient had a PCN reaction causing immediate rash, facial/tongue/throat swelling, SOB or lightheadedness with hypotension: No Has patient had a PCN reaction causing severe rash involving mucus membranes or skin necrosis: No Has patient had a PCN reaction that required hospitalization No Has patient had a PCN reaction occurring within the last 10 years: No If all of the above answers are "NO", then may proceed with Cephalosporin use.   . Pimozide Hives and Itching    Consultations:  cardiology   Procedures/Studies: Dg Chest 2 View  Result Date: 12/30/2017 CLINICAL DATA:  Chest pain, shortness of breath EXAM: CHEST - 2 VIEW COMPARISON:  07/05/2017 FINDINGS: Lungs are clear.  No pleural effusion or pneumothorax. The heart is normal in size. Old left rib fracture deformities. Visualized osseous structures are otherwise within normal limits. IMPRESSION: No evidence of acute cardiopulmonary disease. Electronically Signed   By: Julian Hy M.D.   On: 12/30/2017 12:43        Discharge Exam: Vitals:   12/31/17 0615 12/31/17 0802  BP: (!) 111/49   Pulse: 69   Resp:    Temp: 98.2 F (36.8 C)   SpO2: 97% 93%   Vitals:   12/30/17 1718 12/30/17 1941 12/31/17 0615 12/31/17 0802  BP: 114/72  (!) 111/49   Pulse: (!) 55  69   Resp: 18     Temp: 97.6 F (36.4 C)  98.2 F (36.8 C)   TempSrc: Oral  Oral   SpO2: 100% 98% 97% 93%  Weight: 72.2 kg  71.1 kg   Height: 5\' 1"  (1.549 m)       General: Pt is alert, awake, not in acute distress Cardiovascular: IRRR, S1/S2 +, no rubs, no gallops Respiratory: CTA bilaterally, no wheezing, no rhonchi Abdominal: Soft, NT, ND, bowel sounds + Extremities: no edema, no cyanosis   The results of  significant diagnostics from this hospitalization (including imaging, microbiology, ancillary and laboratory) are listed below for reference.    Significant Diagnostic Studies: Dg Chest 2 View  Result Date: 12/30/2017 CLINICAL DATA:  Chest pain, shortness of breath EXAM: CHEST - 2 VIEW COMPARISON:  07/05/2017 FINDINGS: Lungs are clear.  No pleural effusion or pneumothorax. The heart is normal in size. Old left rib fracture deformities. Visualized osseous structures are otherwise within normal limits. IMPRESSION: No evidence of acute cardiopulmonary disease. Electronically Signed   By: Julian Hy M.D.   On: 12/30/2017 12:43  Microbiology: No results found for this or any previous visit (from the past 240 hour(s)).   Labs: Basic Metabolic Panel: Recent Labs  Lab 12/30/17 1134 12/30/17 1736 12/31/17 0530  NA 135  --  139  K 3.5  --  4.3  CL 104  --  106  CO2 21*  --  25  GLUCOSE 180*  --  107*  BUN 37*  --  32*  CREATININE 1.32*  --  1.44*  CALCIUM 8.8*  --  8.9  MG  --  2.1  --    Liver Function Tests: Recent Labs  Lab 12/30/17 1134  AST 28  ALT 23  ALKPHOS 96  BILITOT 0.5  PROT 7.1  ALBUMIN 3.7   No results for input(s): LIPASE, AMYLASE in the last 168 hours. No results for input(s): AMMONIA in the last 168 hours. CBC: Recent Labs  Lab 12/30/17 1134 12/31/17 0530  WBC 7.5 7.4  NEUTROABS 4.7  --   HGB 11.6* 11.4*  HCT 35.0* 35.2*  MCV 87.9 88.7  PLT 289 271   Cardiac Enzymes: Recent Labs  Lab 12/30/17 1134 12/30/17 1736 12/30/17 2306  TROPONINI <0.03 <0.03 <0.03   BNP: Invalid input(s): POCBNP CBG: No results for input(s): GLUCAP in the last 168 hours.  Time coordinating discharge:  36 minutes  Signed:  Orson Eva, DO [facility] Pager: [phone] 12/31/2017, 12:38 PM

## 2017-12-31 NOTE — Consult Note (Addendum)
Cardiology Consultation:   Patient ID: [name] MRN: [id number]; DOB: 03-Jun-1932  Admit date: 12/30/2017 Date of Consult: 12/31/2017  Primary Care Provider: Rosalee Kaufman, PA-C Primary Cardiologist: Kate Sable, MD  Primary Electrophysiologist:  Cristopher Peru, MD    Patient Profile:   [name] is a 84 y.o. female with a hx of CAD & AFib who is being seen today for the evaluation of chest pain at the request of Dr. Carles Collet.  History of Present Illness:   [name] is an 84 year old with history of CAD status post DES to the circumflex 2015, last cath 2000 1720% proximal LAD with widely patent circumflex stent.  RCA was small and nondominant with 80% ostial stenosis, medical therapy recommended.  Normal nuclear stress test 07/02/2017.  Patient also has atrial fibrillation and underwent cardioversion 10/04/2017 but reverted back to atrial fibrillation.  Recently seen by Dr. Crissie Sickles who felt we are very limited because of prolonged QT and CAD.  He started her on amiodarone with plans to see her back in 5 to 6 weeks and if still in atrial fibrillation perform cardioversion.  She is on Eliquis.  Also has COPD.  Patient came in yesterday with recurrent chest pain.  Troponins negative x3.  EKG atrial fibrillation at 65 bpm. Patient says she has sharp shooting chest pain under left breast down left arm associated with heaviness and heart racing. Takes her breath away.Happens at anytime-laying in bed at night. Comes/goes all day long. Said she had some this am but didn't tell the nurse and fell asleep. No rapid rates on telemetry. Complains of Headache for over a month. On Imdur 60 mg daily but has been on this dose longer than a month.  Past Medical History:  Diagnosis Date  . Anxiety   . Arthritis   . Atrial fibrillation ([place])   . Bursitis    Left shoulder  . Cataract   . CHF (congestive heart failure) ([place])   . COPD (chronic obstructive pulmonary disease) ([place])   .  Coronary atherosclerosis of native coronary artery    a. s/p DES to LCx in 04/2013 b. cath in 11/2015 showing patent stent with 20% prox-LAD and 80% ostial RCA stenosis for which medical management was recommended due to small artery size  . Depression   . Diastolic heart failure (HCC)    EF 55-60%  . Dysphagia, unspecified(787.20)   . Dyspnea   . Dysrhythmia   . Essential hypertension   . GERD (gastroesophageal reflux disease)    Hx Schatzki's ring, multiple EGD/ED last 01/06/2004  . Gout   . Headache   . History of anemia   . Hyperlipidemia   . Internal hemorrhoids without mention of complication   . MI (myocardial infarction) ([place]) 2006  . Microscopic colitis 2003  . Panic disorder without agoraphobia   . Paresthesia   . Pneumonia 12/2011  . PVD (peripheral vascular disease) ([place])   . S/P colonoscopy 09/27/2001   internal hemorrhoids, desc colon inflam polyp, SB BX-chronic duodenitis, colitis  . Thyroid disease     Past Surgical History:  Procedure Laterality Date  . ABDOMINAL HYSTERECTOMY    . ABDOMINAL HYSTERECTOMY    . ANTERIOR AND POSTERIOR REPAIR     with resection of vagina  . ANTERIOR LAT LUMBAR FUSION N/A 08/01/2016   Procedure: Lumbar Two-Lumbar Five Transpsoas lateral interbody fusion with Lumbar Two-Three lateral plate fixation;  Surgeon: Kevan Ny Ditty, MD;  Location: [facility];  Service: Neurosurgery;  Laterality:  N/A;  L2-5 Transpsoas lateral interbody fusion with L2-3 lateral plate fixation  . APPENDECTOMY    . BACK SURGERY    . BIOPSY  07/05/2015   Procedure: BIOPSY;  Surgeon: Daneil Dolin, MD;  [facility];  Service: Endoscopy;;  gastric polyp biopsy, ascending colon biopsy  . BLADDER SUSPENSION  11/09/2011   Procedure: TRANSVAGINAL TAPE (TVT) PROCEDURE;  Surgeon: Marissa Nestle, MD;  Location: [facility];  Service: Urology;  Laterality: N/A;  . bladder tack  06/2010  . BREAST LUMPECTOMY  1998   left, benign  . CARDIAC CATHETERIZATION    .  CARDIAC CATHETERIZATION    . CARDIAC CATHETERIZATION N/A 12/16/2015   Procedure: Left Heart Cath and Coronary Angiography;  Surgeon: Troy Sine, MD;  Location: [facility];  Service: Cardiovascular;  Laterality: N/A;  . CARDIOVERSION N/A 10/04/2017   Procedure: CARDIOVERSION;  Surgeon: Herminio Commons, MD;  Location: [facility];  Service: Cardiovascular;  Laterality: N/A;  . [place]   left  . CHOLECYSTECTOMY  1998  . Cholecystectomy    . COLONOSCOPY  03/16/2011   multiple hyperplastic colon polyps, sigmoid diverticulosis, melanosis coli  . COLONOSCOPY WITH PROPOFOL N/A 07/05/2015   RMR:one 5 mm polyp in descending colon  . CORONARY ANGIOPLASTY WITH STENT PLACEMENT    . ESOPHAGEAL DILATION N/A 07/05/2015   Procedure: ESOPHAGEAL DILATION;  Surgeon: Daneil Dolin, MD;  [facility];  Service: Endoscopy;  Laterality: N/A;  . ESOPHAGOGASTRODUODENOSCOPY (EGD) WITH PROPOFOL N/A 07/05/2015   QTM:[id number]  . JOINT REPLACEMENT Right 2007   right knee  . left hand surgery    . LEFT HEART CATHETERIZATION WITH CORONARY ANGIOGRAM N/A 05/14/2013   Procedure: LEFT HEART CATHETERIZATION WITH CORONARY ANGIOGRAM;  Surgeon: Blane Ohara, MD;  Location: [facility];  Service: Cardiovascular;  Laterality: N/A;  . left rotator cuff surgery    . LUMBAR LAMINECTOMY/DECOMPRESSION MICRODISCECTOMY N/A 10/11/2012   Procedure: LUMBAR LAMINECTOMY/DECOMPRESSION MICRODISCECTOMY 2 LEVELS;  Surgeon: Floyce Stakes, MD;  Location: [facility] OR[facility];  Service: Neurosurgery;  Laterality: N/A;  L3-4 L4-5 Laminectomy  . LUMBAR WOUND DEBRIDEMENT N/A 09/27/2015   Procedure: Exploration of Lumbar Wound w/ Repair CSF Leak/Lumbar Drain Placement;  Surgeon: Leeroy Cha, MD;  Location: [facility]UR[facility] OR[facility];  Service: Neurosurgery;  Laterality: N/A;  . MALONEY DILATION  03/16/2011   Gastritis. No H.pylori on bx. 67F maloney dilation with disruption of  occult cevical esophageal web  . NASAL SINUS SURGERY     . right knee replacement  2007  . right leg benign tumor    . SHOULDER SURGERY Left   . TEE WITHOUT CARDIOVERSION N/A 10/04/2017   Procedure: TRANSESOPHAGEAL ECHOCARDIOGRAM (TEE) WITH PROPOFOL;  Surgeon: Herminio Commons, MD;  [facility];  Service: Cardiovascular;  Laterality: N/A;  . TONSILLECTOMY    . unspecified area, hysterectomy  1972   partial     Home Medications:  Prior to Admission medications   Medication Sig Start Date End Date Taking? Authorizing Provider  acetaminophen (TYLENOL) 500 MG tablet Take 500 mg by mouth daily as needed for headache.    Yes [provider]  ALPRAZolam (XANAX) 0.25 MG tablet Take 0.25 mg by mouth 2 (two) times daily as needed. 09/04/16  Yes [provider]  amiodarone (PACERONE) 200 MG tablet Take 1 tablet (200 mg total) by mouth daily. 12/10/17  Yes Evans Lance, MD  DULoxetine (CYMBALTA) 60 MG capsule Take 60 mg by  mouth at bedtime.    Yes [provider]  ELIQUIS 5 MG TABS tablet TAKE ONE TABLET BY MOUTH TWICE DAILY. 10/26/17  Yes Herminio Commons, MD  fluticasone (FLONASE) 50 MCG/ACT nasal spray Place 2 sprays into both nostrils 2 (two) times daily as needed for allergies.    Yes [provider]  furosemide (LASIX) 20 MG tablet Take 20 mg by mouth daily as needed for fluid or edema.   Yes [provider]  furosemide (LASIX) 40 MG tablet Take 40 mg by mouth daily. 12/03/17  Yes [provider]  ipratropium-albuterol (DUONEB) 0.5-2.5 (3) MG/3ML SOLN Take 3 mLs by nebulization every 6 (six) hours. 12/18/15  Yes Regalado, Belkys A, MD  isosorbide mononitrate (IMDUR) 60 MG 24 hr tablet Take 1 tablet (60 mg total) by mouth daily. 06/14/17  Yes Herminio Commons, MD  lisinopril (PRINIVIL,ZESTRIL) 2.5 MG tablet Take 1 tablet (2.5 mg total) by mouth daily. 09/18/17 12/30/17 Yes Herminio Commons, MD  Magnesium Oxide 400 (240 Mg) MG TABS TAKE ONE TABLET BY MOUTH DAILY. 02/22/17  Yes  Herminio Commons, MD  methocarbamol (ROBAXIN) 500 MG tablet Take 500 mg by mouth every 6 (six) hours as needed for muscle spasms.    Yes [provider]  metoprolol (TOPROL-XL) 200 MG 24 hr tablet TAKE ONE TABLET BY MOUTH DAILY. Patient taking differently: Total daily dose is 250mg  10/26/17  Yes Herminio Commons, MD  metoprolol succinate (TOPROL-XL) 50 MG 24 hr tablet Take 1 tablet (50 mg total) by mouth daily. Patient taking differently: Take 50 mg by mouth daily. Total daily dose is 250mg  09/18/17  Yes Herminio Commons, MD  Multiple Vitamin (MULTIVITAMIN WITH MINERALS) TABS tablet Take 1 tablet by mouth every other day. Centrum    Yes [provider]  nitroGLYCERIN (NITROSTAT) 0.4 MG SL tablet Place 1 tablet (0.4 mg total) under the tongue every 5 (five) minutes as needed for chest pain. Reported on 08/04/2015 11/20/17  Yes Herminio Commons, MD  pantoprazole (PROTONIX) 40 MG tablet Take 40 mg by mouth daily.    Yes [provider]  potassium chloride (K-DUR,KLOR-CON) 10 MEQ tablet TAKE TWO (2) TABLETS BY MOUTH DAILY. 10/26/17  Yes Herminio Commons, MD  allopurinol (ZYLOPRIM) 100 MG tablet Take 1 tablet (100 mg total) daily by mouth. 02/21/17 12/10/17  Murlean Iba, MD    Inpatient Medications: Scheduled Meds: . allopurinol  100 mg Oral Daily  . amiodarone  200 mg Oral Daily  . apixaban  5 mg Oral BID  . DULoxetine  60 mg Oral QHS  . furosemide  40 mg Oral Daily  . ipratropium-albuterol  3 mL Nebulization TID  . [START ON 01/01/2018] isosorbide mononitrate  30 mg Oral Daily  . lisinopril  2.5 mg Oral Daily  . magnesium oxide  400 mg Oral Daily  . metoprolol  200 mg Oral Daily  . metoprolol succinate  50 mg Oral Daily  . multivitamin with minerals  1 tablet Oral QODAY  . pantoprazole  40 mg Oral Daily  . potassium chloride  20 mEq Oral Daily   Continuous Infusions:  PRN Meds: acetaminophen **OR** acetaminophen, ALPRAZolam, fluticasone,  methocarbamol, nitroGLYCERIN, ondansetron **OR** ondansetron (ZOFRAN) IV  Allergies:    Allergies  Allergen Reactions  . Cephalosporins Diarrhea and Nausea Only    Lightheaded  . Levaquin [Levofloxacin In D5w] Swelling  . Macrodantin [Nitrofurantoin Macrocrystal] Swelling  . Phenothiazines Anaphylaxis and Hives  . Polysorbate Anaphylaxis  .  Prednisone Shortness Of Breath  . Buspirone Itching  . Cardura [Doxazosin Mesylate] Itching  . Codeine Itching  . Acyclovir And Related Itching    Redness of skin  . Prochlorperazine Other (See Comments)    "Upset stomach"  . Ranexa [Ranolazine]     Severe drop in BP  . Atorvastatin Hives    Cramping; tolerates Crestor ok  . Ofloxacin Rash  . Other Itching and Rash    "WOOL"= make skin look like it has been burned  . Penicillins Other (See Comments)    Causes redness all over. Has patient had a PCN reaction causing immediate rash, facial/tongue/throat swelling, SOB or lightheadedness with hypotension: No Has patient had a PCN reaction causing severe rash involving mucus membranes or skin necrosis: No Has patient had a PCN reaction that required hospitalization No Has patient had a PCN reaction occurring within the last 10 years: No If all of the above answers are "NO", then may proceed with Cephalosporin use.   . Pimozide Hives and Itching    Social History:   Social History   Socioeconomic History  . Marital status: Divorced    Spouse name: Not on file  . Number of children: 5  . Years of education: Not on file  . Highest education level: Not on file  Occupational History  . Occupation: retired  Scientific laboratory technician  . Financial resource strain: Not on file  . Food insecurity:    Worry: Not on file    Inability: Not on file  . Transportation needs:    Medical: Not on file    Non-medical: Not on file  Tobacco Use  . Smoking status: Former Smoker    Packs/day: 1.00    Years: 64.00    Pack years: 64.00    Types: Cigarettes     Start date: 12/24/1947    Last attempt to quit: 11/17/2001    Years since quitting: 16.1  . Smokeless tobacco: Never Used  . Tobacco comment: Quit smoking in 2003  Substance and Sexual Activity  . Alcohol use: No    Alcohol/week: 0.0 standard drinks  . Drug use: No  . Sexual activity: Never  Lifestyle  . Physical activity:    Days per week: Not on file    Minutes per session: Not on file  . Stress: Not on file  Relationships  . Social connections:    Talks on phone: Not on file    Gets together: Not on file    Attends religious service: Not on file    Active member of club or organization: Not on file    Attends meetings of clubs or organizations: Not on file    Relationship status: Not on file  . Intimate partner violence:    Fear of current or ex partner: Not on file    Emotionally abused: Not on file    Physically abused: Not on file    Forced sexual activity: Not on file  Other Topics Concern  . Not on file  Social History Narrative   Divorced.   Sister had colon perforation & died from complications in [place], [place]    Family History:    Family History  Problem Relation Age of Onset  . Stroke Mother   . Parkinson's disease Father   . Coronary artery disease Other        family Hx-sons  . Cancer Other   . Stroke Other        family Hx  . Hypertension Other  family Hx  . Diabetes Brother   . Heart disease Son        before age 82  . Diabetes Son   . Stroke Daughter 15  . Colon cancer Neg Hx      ROS:  Please see the history of present illness.  Review of Systems  Constitution: Positive for malaise/fatigue.  HENT: Negative.   Eyes: Negative.   Cardiovascular: Positive for chest pain, dyspnea on exertion, irregular heartbeat and palpitations.  Respiratory: Negative.   Hematologic/Lymphatic: Negative.   Musculoskeletal: Negative.  Negative for joint pain.  Gastrointestinal: Negative.   Genitourinary: Negative.   Neurological: Positive for headaches and  weakness.  Psychiatric/Behavioral: The patient is nervous/anxious.     All other ROS reviewed and negative.     Physical Exam/Data:   Vitals:   12/30/17 1718 12/30/17 1941 12/31/17 0615 12/31/17 0802  BP: 114/72  (!) 111/49   Pulse: (!) 55  69   Resp: 18     Temp: 97.6 F (36.4 C)  98.2 F (36.8 C)   TempSrc: Oral  Oral   SpO2: 100% 98% 97% 93%  Weight: 72.2 kg  71.1 kg   Height: 5\' 1"  (1.549 m)       Intake/Output Summary (Last 24 hours) at 12/31/2017 1009 Last data filed at 12/31/2017 0900 Gross per 24 hour  Intake 240 ml  Output 500 ml  Net -260 ml   Filed Weights   12/30/17 1122 12/30/17 1718 12/31/17 0615  Weight: 71 kg 72.2 kg 71.1 kg   Body mass index is 29.62 kg/m.  General:  Well nourished, well developed, in no acute distress HEENT: normal Lymph: no adenopathy Neck: no JVD Endocrine:  No thryomegaly Vascular: No carotid bruits; FA pulses 2+ bilaterally without bruits  Cardiac:  normal S1, S2; irreg irreg with 9-5/0 diastolic murmur LSB Lungs:  clear to auscultation bilaterally, no wheezing, rhonchi or rales  Abd: soft, nontender, no hepatomegaly  Ext: no edema Musculoskeletal:  No deformities, BUE and BLE strength normal and equal Skin: warm and dry  Neuro:  CNs 2-12 intact, no focal abnormalities noted Psych:  Normal affect   EKG:  The EKG was personally reviewed and demonstrates:  Afib with CVR Telemetry:  Telemetry was personally reviewed and demonstrates:  Afib 65-85  Relevant CV Studies:  Cardiac catheterization 2017Ost RCA lesion, 80 %stenosed.  Ost Cx to Prox Cx lesion, 0 %stenosed.  Ost LAD to Prox LAD lesion, 20 %stenosed.  1st Diag lesion, 20 %stenosed.  4th Mrg lesion, 20 %stenosed.  The left ventricular systolic function is normal.  LV end diastolic pressure is mildly elevated.  The left ventricular ejection fraction is 60-65% by visual estimate.   Normal LV function with an ejection fraction of 60-65%.  There is evidence for  left ventricular hypertrophy.   Coronary obstructive disease with evidence for smooth eccentric plaque in the proximal LAD of 20%, 20% stenosis in the inferior branch of the first diagonal vessel; a widely patent previously placed left circumflex stent extending from the ostium to the first marginal takeoff in a large dominant circumflex vessel with 20% distal narrowing; and a small nondominant RCA with 80% ostial stenosis.   RECOMMENDATION: Medical therapy.  Of note, the patient does have significant change in respiratory intrathoracic pressure from inspiratory and expiratory cycles.   TEE echo 6/20/19Study Conclusions   - Left ventricle: The cavity size was normal. Systolic function was   mildly to moderately reduced. The estimated ejection fraction was  in the range of 40% to 45%. Diffuse hypokinesis. There was a   thrombus. - Aortic valve: Trileaflet; mildly thickened leaflets. There was   mild regurgitation. - Mitral valve: There was mild regurgitation. - Left atrium: The atrium was moderately to severely dilated. No   evidence of thrombus in the atrial cavity or appendage. No   evidence of thrombus in the atrial cavity or appendage. Pulsed   Doppler intracavitary velocities were low normal. There was   &quot;smoke&quot; seen in the appendage and left atrium indicating sluggish   flow. - Right ventricle: Systolic function was mildly reduced. - Atrial septum: No defect or patent foramen ovale was identified.   Impressions:   - Successful cardioversion. No intracardiac thrombus.    Nuclear stress test 3/18/2019Normal perfusion No ischemia  This is a low risk study.  Nuclear stress EF: 60%.     Laboratory Data:  Chemistry Recent Labs  Lab 12/30/17 1134 12/31/17 0530  NA 135 139  K 3.5 4.3  CL 104 106  CO2 21* 25  GLUCOSE 180* 107*  BUN 37* 32*  CREATININE 1.32* 1.44*  CALCIUM 8.8* 8.9  GFRNONAA 36* 33*  GFRAA 42* 38*  ANIONGAP 10 8    Recent Labs  Lab  12/30/17 1134  PROT 7.1  ALBUMIN 3.7  AST 28  ALT 23  ALKPHOS 96  BILITOT 0.5   Hematology Recent Labs  Lab 12/30/17 1134 12/31/17 0530  WBC 7.5 7.4  RBC 3.98 3.97  HGB 11.6* 11.4*  HCT 35.0* 35.2*  MCV 87.9 88.7  MCH 29.1 28.7  MCHC 33.1 32.4  RDW 14.9 14.9  PLT 289 271   Cardiac Enzymes Recent Labs  Lab 12/30/17 1134 12/30/17 1736 12/30/17 2306  TROPONINI <0.03 <0.03 <0.03    Recent Labs  Lab 12/30/17 1138  TROPIPOC 0.00    BNPNo results for input(s): BNP, PROBNP in the last 168 hours.  DDimer No results for input(s): DDIMER in the last 168 hours.  Radiology/Studies:  Dg Chest 2 View  Result Date: 12/30/2017 CLINICAL DATA:  Chest pain, shortness of breath EXAM: CHEST - 2 VIEW COMPARISON:  07/05/2017 FINDINGS: Lungs are clear.  No pleural effusion or pneumothorax. The heart is normal in size. Old left rib fracture deformities. Visualized osseous structures are otherwise within normal limits. IMPRESSION: No evidence of acute cardiopulmonary disease. Electronically Signed   By: Julian Hy M.D.   On: 12/30/2017 12:43    Assessment and Plan:   1. Chest pain troponins negative x3, EKG without acute change. Associated with heart racing, but HR controlled on EKG's & telemetry. Doesn't tolerate Afib well. ? Increase Amiodarone for a faster load?  Also on Toprol XL 250 mg daily.Wouldn't pursue ischemic workup at this time. 2. Atrial fibrillation, rate controlled currently on amiodarone and metoprolol.  Plans for cardioversion after 5 to 6 weeks of amiodarone therapy.  Also on Eliquis.Has f/u with Dr. Lovena Le 10/2 3. History of CAD status post DES to the circumflex in 2015.  Last cath 2017 patent stent, 80% ostial RCA which was small treated medically, normal nuclear stress test 06/2017 4. Essential hypertension BP stable 5. Chronic systolic CHF LVEF 68-34% TEE 09/2017 compensated 6. Hyperlipidemia statin intolerant 7. Headaches-will try to reduce Imdur to 30 mg daily  to see if this helps. 8. CKD Crt 1.44 on low dose lisinopril 2.5 mg daily and lasix 40 mg daily     For questions or updates, please contact [place] Please consult [email or web address] for contact info  under    Signed, Ermalinda Barrios, PA-C 12/31/2017 10:09 AM   Attending note:  Patient seen and examined.  I reviewed extensive records and discussed the case with Ms. Bonnell Public PA-C.  [name] has a history of CAD status post DES to the circumflex in 2015 with subsequently documented obstructive small vessel RCA disease that was managed medically as of 2017, hypertension, chronic systolic heart failure, and persistent atrial fibrillation.  She was seen recently by Dr. Lovena Le with plan for amiodarone load on top of high-dose Toprol-XL and ultimately an attempt at cardioversion.  She had undergone cardioversion previously that was successful for about 3 days at which point she felt much better in terms of symptoms.  She presents to the hospital with shortness of breath and also atypical, sharp shooting pains in her left lateral thorax.  No definite trigger for the symptoms but she is generally fatigued overall.  Heart rates have been adequately controlled by telemetry which shows persistent atrial fibrillation.  On examination this morning she appears comfortable, lying flat in bed.  Heart rate is in the 70s in atrial fibrillation, blood pressure 111/49.  Lungs are clear without labored breathing.  Cardiac exam reveals irregularly irregular rhythm without gallop.  Blood work shows potassium 4.3, BUN 32, creatinine 1.44, hemoglobin 11.4, troponin I levels negative x3.  I personally reviewed her ECG which shows rate controlled atrial for ablation with low voltage and nonspecific ST changes.  Chest x-ray reports normal heart size without acute process.  Patient presents with atypical chest pain, no clear evidence of ACS by cardiac enzymes or ECG.  She remains in atrial fibrillation which is likely causing  her shortness of breath and fatigue as this has been the case previously.  Heart rate is however well controlled and would continue present doses of Toprol-XL and amiodarone.  She is also on Eliquis for stroke prophylaxis.  She has a follow-up visit to see Dr. Lovena Le on October 3.  Expect that she will need a repeat attempt at cardioversion.  Do not plan further ischemic testing as an inpatient.  If she ambulates reasonably well today, consider discharge home.  Satira Sark, M.D., F.A.C.C.

## 2017-12-31 NOTE — Care Management Note (Signed)
Case Management Note  Patient Details  Name: [name] MRN: [id number] Date of Birth: November 04, 1932  Subjective/Objective:       Observation for chest pain. Pt from home, ind with ADL's, has family support. Chart reviewed for CM needs. No CM consult entered. Pt has insurance, PCP and transportation. Pt is on the [facility] registry. This is her only stay in the past 6 months. She has ambulated with staff prior to DC.             Action/Plan: DC home today with self care. No CM needs noted at this time.   Expected Discharge Date:  12/31/17               Expected Discharge Plan:  Home/Self Care  In-House Referral:  NA  Discharge planning Services  CM Consult  Post Acute Care Choice:  NA Choice offered to:  NA  Status of Service:  Completed, signed off  If discussed at Long Length of Stay Meetings, dates discussed:    Additional Comments:  Sherald Barge, RN 12/31/2017, 12:11 PM

## 2017-12-31 NOTE — Progress Notes (Signed)
[name] discharged Home per MD order.  Discharge instructions reviewed and discussed with the patient, all questions and concerns answered. Copy of instructions and scripts given to patient.  Allergies as of 12/31/2017      Reactions   Cephalosporins Diarrhea, Nausea Only   Lightheaded   Levaquin [levofloxacin In D5w] Swelling   Macrodantin [nitrofurantoin Macrocrystal] Swelling   Phenothiazines Anaphylaxis, Hives   Polysorbate Anaphylaxis   Prednisone Shortness Of Breath   Buspirone Itching   Cardura [doxazosin Mesylate] Itching   Codeine Itching   Acyclovir And Related Itching   Redness of skin   Prochlorperazine Other (See Comments)   "Upset stomach"   Ranexa [ranolazine]    Severe drop in BP   Atorvastatin Hives   Cramping; tolerates Crestor ok   Ofloxacin Rash   Other Itching, Rash   "WOOL"= make skin look like it has been burned   Penicillins Other (See Comments)   Causes redness all over. Has patient had a PCN reaction causing immediate rash, facial/tongue/throat swelling, SOB or lightheadedness with hypotension: No Has patient had a PCN reaction causing severe rash involving mucus membranes or skin necrosis: No Has patient had a PCN reaction that required hospitalization No Has patient had a PCN reaction occurring within the last 10 years: No If all of the above answers are "NO", then may proceed with Cephalosporin use.   Pimozide Hives, Itching      Medication List    TAKE these medications   acetaminophen 500 MG tablet Commonly known as:  TYLENOL Take 500 mg by mouth daily as needed for headache.   allopurinol 100 MG tablet Commonly known as:  ZYLOPRIM Take 1 tablet (100 mg total) daily by mouth.   ALPRAZolam 0.25 MG tablet Commonly known as:  XANAX Take 0.25 mg by mouth 2 (two) times daily as needed.   amiodarone 200 MG tablet Commonly known as:  PACERONE Take 1 tablet (200 mg total) by mouth daily.   cefdinir 300 MG capsule Commonly known as:   OMNICEF Take 1 capsule (300 mg total) by mouth every 12 (twelve) hours.   DULoxetine 60 MG capsule Commonly known as:  CYMBALTA Take 60 mg by mouth at bedtime.   ELIQUIS 5 MG Tabs tablet Generic drug:  apixaban TAKE ONE TABLET BY MOUTH TWICE DAILY.   fluticasone 50 MCG/ACT nasal spray Commonly known as:  FLONASE Place 2 sprays into both nostrils 2 (two) times daily as needed for allergies.   furosemide 20 MG tablet Commonly known as:  LASIX Take 20 mg by mouth daily as needed for fluid or edema.   furosemide 40 MG tablet Commonly known as:  LASIX Take 40 mg by mouth daily.   ipratropium-albuterol 0.5-2.5 (3) MG/3ML Soln Commonly known as:  DUONEB Take 3 mLs by nebulization every 6 (six) hours.   isosorbide mononitrate 30 MG 24 hr tablet Commonly known as:  IMDUR Take 1 tablet (30 mg total) by mouth daily. Start taking on:  01/01/2018 What changed:    medication strength  how much to take   lisinopril 2.5 MG tablet Commonly known as:  PRINIVIL,ZESTRIL Take 1 tablet (2.5 mg total) by mouth daily.   Magnesium Oxide 400 (240 Mg) MG Tabs TAKE ONE TABLET BY MOUTH DAILY.   methocarbamol 500 MG tablet Commonly known as:  ROBAXIN Take 500 mg by mouth every 6 (six) hours as needed for muscle spasms.   metoprolol succinate 50 MG 24 hr tablet Commonly known as:  TOPROL-XL Take 1  tablet (50 mg total) by mouth daily. What changed:  additional instructions   metoprolol 200 MG 24 hr tablet Commonly known as:  TOPROL-XL TAKE ONE TABLET BY MOUTH DAILY. What changed:    how much to take  how to take this  when to take this  additional instructions   multivitamin with minerals Tabs tablet Take 1 tablet by mouth every other day. Centrum   nitroGLYCERIN 0.4 MG SL tablet Commonly known as:  NITROSTAT Place 1 tablet (0.4 mg total) under the tongue every 5 (five) minutes as needed for chest pain. Reported on 08/04/2015   pantoprazole 40 MG tablet Commonly known as:   PROTONIX Take 40 mg by mouth daily.   potassium chloride 10 MEQ tablet Commonly known as:  K-DUR,KLOR-CON TAKE TWO (2) TABLETS BY MOUTH DAILY.       Patients skin is clean, dry and intact, no evidence of skin break down. IV site discontinued and catheter remains intact. Site without signs and symptoms of complications. Dressing and pressure applied.  Patient escorted to car in a wheelchair,  no distress noted upon discharge.  [name] 12/31/2017 1:30 PM

## 2017-12-31 NOTE — Progress Notes (Signed)
Results reviewed of urinalysis, notified on call provider, no new orders.

## 2018-01-02 LAB — URINE CULTURE

## 2018-01-11 DIAGNOSIS — F329 Major depressive disorder, single episode, unspecified: Secondary | ICD-10-CM | POA: Diagnosis not present

## 2018-01-11 DIAGNOSIS — J449 Chronic obstructive pulmonary disease, unspecified: Secondary | ICD-10-CM | POA: Diagnosis not present

## 2018-01-11 DIAGNOSIS — I482 Chronic atrial fibrillation: Secondary | ICD-10-CM | POA: Diagnosis not present

## 2018-01-11 DIAGNOSIS — N183 Chronic kidney disease, stage 3 (moderate): Secondary | ICD-10-CM | POA: Diagnosis not present

## 2018-01-11 DIAGNOSIS — R300 Dysuria: Secondary | ICD-10-CM | POA: Diagnosis not present

## 2018-01-11 DIAGNOSIS — F419 Anxiety disorder, unspecified: Secondary | ICD-10-CM | POA: Diagnosis not present

## 2018-01-11 DIAGNOSIS — R079 Chest pain, unspecified: Secondary | ICD-10-CM | POA: Diagnosis not present

## 2018-01-11 DIAGNOSIS — I10 Essential (primary) hypertension: Secondary | ICD-10-CM | POA: Diagnosis not present

## 2018-01-11 DIAGNOSIS — Z6829 Body mass index (BMI) 29.0-29.9, adult: Secondary | ICD-10-CM | POA: Diagnosis not present

## 2018-01-14 DIAGNOSIS — N183 Chronic kidney disease, stage 3 (moderate): Secondary | ICD-10-CM | POA: Diagnosis not present

## 2018-01-14 DIAGNOSIS — J440 Chronic obstructive pulmonary disease with acute lower respiratory infection: Secondary | ICD-10-CM | POA: Diagnosis not present

## 2018-01-14 DIAGNOSIS — I482 Chronic atrial fibrillation: Secondary | ICD-10-CM | POA: Diagnosis not present

## 2018-01-14 DIAGNOSIS — I10 Essential (primary) hypertension: Secondary | ICD-10-CM | POA: Diagnosis not present

## 2018-01-16 ENCOUNTER — Ambulatory Visit (INDEPENDENT_AMBULATORY_CARE_PROVIDER_SITE_OTHER): Payer: Medicare Other | Admitting: Internal Medicine

## 2018-01-16 ENCOUNTER — Encounter: Payer: Self-pay | Admitting: Internal Medicine

## 2018-01-16 ENCOUNTER — Other Ambulatory Visit (HOSPITAL_COMMUNITY)
Admission: RE | Admit: 2018-01-16 | Discharge: 2018-01-16 | Disposition: A | Discharge status: Home / Self Care | Payer: Medicare Other | Source: Ambulatory Visit | Attending: Internal Medicine | Admitting: Internal Medicine

## 2018-01-16 VITALS — BP 130/70 | HR 78 | Ht 61.0 in | Wt 160.0 lb

## 2018-01-16 DIAGNOSIS — I5043 Acute on chronic combined systolic (congestive) and diastolic (congestive) heart failure: Secondary | ICD-10-CM

## 2018-01-16 DIAGNOSIS — I200 Unstable angina: Secondary | ICD-10-CM | POA: Diagnosis not present

## 2018-01-16 DIAGNOSIS — I480 Paroxysmal atrial fibrillation: Secondary | ICD-10-CM | POA: Insufficient documentation

## 2018-01-16 LAB — CBC
HCT: 33.7 % — ABNORMAL LOW (ref 36.0–46.0)
HEMOGLOBIN: 11.1 g/dL — AB (ref 12.0–15.0)
MCH: 29.4 pg (ref 26.0–34.0)
MCHC: 32.9 g/dL (ref 30.0–36.0)
MCV: 89.2 fL (ref 78.0–100.0)
Platelets: 283 10*3/uL (ref 150–400)
RBC: 3.78 MIL/uL — ABNORMAL LOW (ref 3.87–5.11)
RDW: 14.7 % (ref 11.5–15.5)
WBC: 7 10*3/uL (ref 4.0–10.5)

## 2018-01-16 LAB — BASIC METABOLIC PANEL
ANION GAP: 10 (ref 5–15)
BUN: 31 mg/dL — ABNORMAL HIGH (ref 8–23)
CALCIUM: 9.1 mg/dL (ref 8.9–10.3)
CO2: 24 mmol/L (ref 22–32)
Chloride: 103 mmol/L (ref 98–111)
Creatinine, Ser: 1.42 mg/dL — ABNORMAL HIGH (ref 0.44–1.00)
GFR calc non Af Amer: 33 mL/min — ABNORMAL LOW (ref >60)
GFR, EST AFRICAN AMERICAN: 38 mL/min — AB (ref >60)
Glucose, Bld: 104 mg/dL — ABNORMAL HIGH (ref 70–99)
Potassium: 4.1 mmol/L (ref 3.5–5.1)
SODIUM: 137 mmol/L (ref 135–145)

## 2018-01-16 NOTE — Progress Notes (Signed)
HPI [name] returns today for ongoing evaluation and treatment of atrial fib. She is an 84 yo woman with copd, chronic systolic and diastolic heart failure, CAD, s/p PCI, who has had atrial fib for several months. She was placed on amiodarone. She returns today for followup. She c/o HA. She thinks her allergies are the problem.  Allergies  Allergen Reactions  . Cephalosporins Diarrhea and Nausea Only    Lightheaded  . Levaquin [Levofloxacin In D5w] Swelling  . Macrodantin [Nitrofurantoin Macrocrystal] Swelling  . Phenothiazines Anaphylaxis and Hives  . Polysorbate Anaphylaxis  . Prednisone Shortness Of Breath  . Buspirone Itching  . Cardura [Doxazosin Mesylate] Itching  . Codeine Itching  . Acyclovir And Related Itching    Redness of skin  . Prochlorperazine Other (See Comments)    "Upset stomach"  . Ranexa [Ranolazine]     Severe drop in BP  . Atorvastatin Hives    Cramping; tolerates Crestor ok  . Ofloxacin Rash  . Other Itching and Rash    "WOOL"= make skin look like it has been burned  . Penicillins Other (See Comments)    Causes redness all over. Has patient had a PCN reaction causing immediate rash, facial/tongue/throat swelling, SOB or lightheadedness with hypotension: No Has patient had a PCN reaction causing severe rash involving mucus membranes or skin necrosis: No Has patient had a PCN reaction that required hospitalization No Has patient had a PCN reaction occurring within the last 10 years: No If all of the above answers are "NO", then may proceed with Cephalosporin use.   . Pimozide Hives and Itching     Current Outpatient Medications  Medication Sig Dispense Refill  . acetaminophen (TYLENOL) 500 MG tablet Take 500 mg by mouth daily as needed for headache.     . allopurinol (ZYLOPRIM) 100 MG tablet Take 1 tablet (100 mg total) daily by mouth. 30 tablet 0  . ALPRAZolam (XANAX) 0.25 MG tablet Take 0.25 mg by mouth 2 (two) times daily as needed.  0    . amiodarone (PACERONE) 200 MG tablet Take 1 tablet (200 mg total) by mouth daily. 90 tablet 3  . cefdinir (OMNICEF) 300 MG capsule Take 1 capsule (300 mg total) by mouth every 12 (twelve) hours. 10 capsule 0  . DULoxetine (CYMBALTA) 60 MG capsule Take 60 mg by mouth at bedtime.     [name] Kitchen ELIQUIS 5 MG TABS tablet TAKE ONE TABLET BY MOUTH TWICE DAILY. 180 tablet 0  . fluticasone (FLONASE) 50 MCG/ACT nasal spray Place 2 sprays into both nostrils 2 (two) times daily as needed for allergies.     . furosemide (LASIX) 20 MG tablet Take 20 mg by mouth daily as needed for fluid or edema.    . furosemide (LASIX) 40 MG tablet Take 40 mg by mouth daily.  3  . ipratropium-albuterol (DUONEB) 0.5-2.5 (3) MG/3ML SOLN Take 3 mLs by nebulization every 6 (six) hours. 360 mL 0  . isosorbide mononitrate (IMDUR) 30 MG 24 hr tablet Take 1 tablet (30 mg total) by mouth daily. 30 tablet 1  . Magnesium Oxide 400 (240 Mg) MG TABS TAKE ONE TABLET BY MOUTH DAILY. 90 tablet 3  . methocarbamol (ROBAXIN) 500 MG tablet Take 500 mg by mouth every 6 (six) hours as needed for muscle spasms.     . metoprolol (TOPROL-XL) 200 MG 24 hr tablet TAKE ONE TABLET BY MOUTH DAILY. (Patient taking differently: Total daily dose is 250mg ) 90 tablet 0  .  metoprolol succinate (TOPROL-XL) 50 MG 24 hr tablet Take 1 tablet (50 mg total) by mouth daily. (Patient taking differently: Take 50 mg by mouth daily. Total daily dose is 250mg ) 90 tablet 3  . Multiple Vitamin (MULTIVITAMIN WITH MINERALS) TABS tablet Take 1 tablet by mouth every other day. Centrum     . nitroGLYCERIN (NITROSTAT) 0.4 MG SL tablet Place 1 tablet (0.4 mg total) under the tongue every 5 (five) minutes as needed for chest pain. Reported on 08/04/2015 25 tablet 6  . pantoprazole (PROTONIX) 40 MG tablet Take 40 mg by mouth daily.     . potassium chloride (K-DUR,KLOR-CON) 10 MEQ tablet TAKE TWO (2) TABLETS BY MOUTH DAILY. 180 tablet 0  . lisinopril (PRINIVIL,ZESTRIL) 2.5 MG tablet Take 1  tablet (2.5 mg total) by mouth daily. 90 tablet 3   No current facility-administered medications for this visit.      Past Medical History:  Diagnosis Date  . Anxiety   . Arthritis   . Atrial fibrillation ([place])   . Bursitis    Left shoulder  . Cataract   . CHF (congestive heart failure) ([place])   . COPD (chronic obstructive pulmonary disease) ([place])   . Coronary atherosclerosis of native coronary artery    a. s/p DES to LCx in 04/2013 b. cath in 11/2015 showing patent stent with 20% prox-LAD and 80% ostial RCA stenosis for which medical management was recommended due to small artery size  . Depression   . Diastolic heart failure ([facility])    EF 55-60%  . Dysphagia, unspecified(787.20)   . Dyspnea   . Dysrhythmia   . Essential hypertension   . GERD (gastroesophageal reflux disease)    Hx Schatzki's ring, multiple EGD/ED last 01/06/2004  . Gout   . Headache   . History of anemia   . Hyperlipidemia   . Internal hemorrhoids without mention of complication   . MI (myocardial infarction) ([place]) 2006  . Microscopic colitis 2003  . Panic disorder without agoraphobia   . Paresthesia   . Pneumonia 12/2011  . PVD (peripheral vascular disease) ([place])   . S/P colonoscopy 09/27/2001   internal hemorrhoids, desc colon inflam polyp, SB BX-chronic duodenitis, colitis  . Thyroid disease     ROS:   All systems reviewed and negative except as noted in the HPI.   Past Surgical History:  Procedure Laterality Date  . ABDOMINAL HYSTERECTOMY    . ABDOMINAL HYSTERECTOMY    . ANTERIOR AND POSTERIOR REPAIR     with resection of vagina  . ANTERIOR LAT LUMBAR FUSION N/A 08/01/2016   Procedure: Lumbar Two-Lumbar Five Transpsoas lateral interbody fusion with Lumbar Two-Three lateral plate fixation;  Surgeon: Kevan Ny Ditty, MD;  Location: [facility];  Service: Neurosurgery;  Laterality: N/A;  L2-5 Transpsoas lateral interbody fusion with L2-3 lateral plate fixation  . APPENDECTOMY    . BACK SURGERY      . BIOPSY  07/05/2015   Procedure: BIOPSY;  Surgeon: Daneil Dolin, MD;  [facility];  Service: Endoscopy;;  gastric polyp biopsy, ascending colon biopsy  . BLADDER SUSPENSION  11/09/2011   Procedure: TRANSVAGINAL TAPE (TVT) PROCEDURE;  Surgeon: Marissa Nestle, MD;  Location: [facility];  Service: Urology;  Laterality: N/A;  . bladder tack  06/2010  . BREAST LUMPECTOMY  1998   left, benign  . CARDIAC CATHETERIZATION    . CARDIAC CATHETERIZATION    . CARDIAC CATHETERIZATION N/A 12/16/2015   Procedure: Left Heart Cath and Coronary Angiography;  Surgeon: Troy Sine, MD;  Location: [facility];  Service: Cardiovascular;  Laterality: N/A;  . CARDIOVERSION N/A 10/04/2017   Procedure: CARDIOVERSION;  Surgeon: Herminio Commons, MD;  Location: [facility];  Service: Cardiovascular;  Laterality: N/A;  . [place]   left  . CHOLECYSTECTOMY  1998  . Cholecystectomy    . COLONOSCOPY  03/16/2011   multiple hyperplastic colon polyps, sigmoid diverticulosis, melanosis coli  . COLONOSCOPY WITH PROPOFOL N/A 07/05/2015   RMR:one 5 mm polyp in descending colon  . CORONARY ANGIOPLASTY WITH STENT PLACEMENT    . ESOPHAGEAL DILATION N/A 07/05/2015   Procedure: ESOPHAGEAL DILATION;  Surgeon: Daneil Dolin, MD;  [facility];  Service: Endoscopy;  Laterality: N/A;  . ESOPHAGOGASTRODUODENOSCOPY (EGD) WITH PROPOFOL N/A 07/05/2015   WHQ:PRFFMB  . JOINT REPLACEMENT Right 2007   right knee  . left hand surgery    . LEFT HEART CATHETERIZATION WITH CORONARY ANGIOGRAM N/A 05/14/2013   Procedure: LEFT HEART CATHETERIZATION WITH CORONARY ANGIOGRAM;  Surgeon: Blane Ohara, MD;  Location: [facility];  Service: Cardiovascular;  Laterality: N/A;  . left rotator cuff surgery    . LUMBAR LAMINECTOMY/DECOMPRESSION MICRODISCECTOMY N/A 10/11/2012   Procedure: LUMBAR LAMINECTOMY/DECOMPRESSION MICRODISCECTOMY 2 LEVELS;  Surgeon: Floyce Stakes, MD;  Location: [facility];   Service: Neurosurgery;  Laterality: N/A;  L3-4 L4-5 Laminectomy  . LUMBAR WOUND DEBRIDEMENT N/A 09/27/2015   Procedure: Exploration of Lumbar Wound w/ Repair CSF Leak/Lumbar Drain Placement;  Surgeon: Leeroy Cha, MD;  Location: [facility];  Service: Neurosurgery;  Laterality: N/A;  . MALONEY DILATION  03/16/2011   Gastritis. No H.pylori on bx. 22F maloney dilation with disruption of  occult cevical esophageal web  . NASAL SINUS SURGERY    . right knee replacement  2007  . right leg benign tumor    . SHOULDER SURGERY Left   . TEE WITHOUT CARDIOVERSION N/A 10/04/2017   Procedure: TRANSESOPHAGEAL ECHOCARDIOGRAM (TEE) WITH PROPOFOL;  Surgeon: Herminio Commons, MD;  Location: [facility];  Service: Cardiovascular;  Laterality: N/A;  . TONSILLECTOMY    . unspecified area, hysterectomy  1972   partial     Family History  Problem Relation Age of Onset  . Stroke Mother   . Parkinson's disease Father   . Coronary artery disease Other        family Hx-sons  . Cancer Other   . Stroke Other        family Hx  . Hypertension Other        family Hx  . Diabetes Brother   . Heart disease Son        before age 57  . Diabetes Son   . Stroke Daughter 21  . Colon cancer Neg Hx      Social History   Socioeconomic History  . Marital status: Divorced    Spouse name: Not on file  . Number of children: 5  . Years of education: Not on file  . Highest education level: Not on file  Occupational History  . Occupation: retired  Scientific laboratory technician  . Financial resource strain: Not on file  . Food insecurity:    Worry: Not on file    Inability: Not on file  . Transportation needs:    Medical: Not on file    Non-medical: Not on file  Tobacco Use  . Smoking status: Former Smoker    Packs/day: 1.00    Years: 64.00  Pack years: 64.00    Types: Cigarettes    Start date: 12/24/1947    Last attempt to quit: 11/17/2001    Years since quitting: 16.1  . Smokeless tobacco: Never Used  . Tobacco  comment: Quit smoking in 2003  Substance and Sexual Activity  . Alcohol use: No    Alcohol/week: 0.0 standard drinks  . Drug use: No  . Sexual activity: Never  Lifestyle  . Physical activity:    Days per week: Not on file    Minutes per session: Not on file  . Stress: Not on file  Relationships  . Social connections:    Talks on phone: Not on file    Gets together: Not on file    Attends religious service: Not on file    Active member of club or organization: Not on file    Attends meetings of clubs or organizations: Not on file    Relationship status: Not on file  . Intimate partner violence:    Fear of current or ex partner: Not on file    Emotionally abused: Not on file    Physically abused: Not on file    Forced sexual activity: Not on file  Other Topics Concern  . Not on file  Social History Narrative   Divorced.   Sister had colon perforation & died from complications in [place], [place]     BP 130/70 (BP Location: Right Arm)   Pulse 78   Ht 5\' 1"  (1.549 m)   Wt 160 lb (72.6 kg)   SpO2 96%   BMI 30.23 kg/m   Physical Exam:  Well appearing NAD HEENT: Unremarkable Neck:  No JVD, no thyromegally Lymphatics:  No adenopathy Back:  No CVA tenderness Lungs:  Clear with no wheezes HEART:  Regular rate rhythm, no murmurs, no rubs, no clicks Abd:  soft, positive bowel sounds, no organomegally, no rebound, no guarding Ext:  2 plus pulses, no edema, no cyanosis, no clubbing Skin:  No rashes no nodules Neuro:  CN II through XII intact, motor grossly intact  EKG - atrial fib with a controlled VR   Assess/Plan: 1. Persistent atrial fib - she has been on amiodarone for the past 6 weeks. We will schedule her a DCCV in the next week or two. 2. Coags - she denies missing any doses of Eliquis. 3. HTN - her blood pressure is reasonably well controlled. 4. Diastolic heart failure - her symptoms are well controlled but hopefully she will feel better in NSR.   Mikle Bosworth.D.

## 2018-01-16 NOTE — Patient Instructions (Signed)
Medication Instructions:  Your physician recommends that you continue on your current medications as directed. Please refer to the Current Medication list given to you today.   Labwork: Your physician recommends that you return for lab work in: Today    Testing/Procedures: Your physician has recommended that you have a Cardioversion (DCCV). Electrical Cardioversion uses a jolt of electricity to your heart either through paddles or wired patches attached to your chest. This is a controlled, usually prescheduled, procedure. Defibrillation is done under light anesthesia in the hospital, and you usually go home the day of the procedure. This is done to get your heart back into a normal rhythm. You are not awake for the procedure. Please see the instruction sheet given to you today.    Follow-Up: Your physician recommends that you schedule a follow-up appointment in: 3 Months    Any Other Special Instructions Will Be Listed Below (If Applicable).     If you need a refill on your cardiac medications before your next appointment, please call your pharmacy.  Thank you for choosing [facility]!

## 2018-01-16 NOTE — H&P (View-Only) (Signed)
HPI [name] returns today for ongoing evaluation and treatment of atrial fib. She is an 84 yo woman with copd, chronic systolic and diastolic heart failure, CAD, s/p PCI, who has had atrial fib for several months. She was placed on amiodarone. She returns today for followup. She c/o HA. She thinks her allergies are the problem.  Allergies  Allergen Reactions  . Cephalosporins Diarrhea and Nausea Only    Lightheaded  . Levaquin [Levofloxacin In D5w] Swelling  . Macrodantin [Nitrofurantoin Macrocrystal] Swelling  . Phenothiazines Anaphylaxis and Hives  . Polysorbate Anaphylaxis  . Prednisone Shortness Of Breath  . Buspirone Itching  . Cardura [Doxazosin Mesylate] Itching  . Codeine Itching  . Acyclovir And Related Itching    Redness of skin  . Prochlorperazine Other (See Comments)    "Upset stomach"  . Ranexa [Ranolazine]     Severe drop in BP  . Atorvastatin Hives    Cramping; tolerates Crestor ok  . Ofloxacin Rash  . Other Itching and Rash    "WOOL"= make skin look like it has been burned  . Penicillins Other (See Comments)    Causes redness all over. Has patient had a PCN reaction causing immediate rash, facial/tongue/throat swelling, SOB or lightheadedness with hypotension: No Has patient had a PCN reaction causing severe rash involving mucus membranes or skin necrosis: No Has patient had a PCN reaction that required hospitalization No Has patient had a PCN reaction occurring within the last 10 years: No If all of the above answers are "NO", then may proceed with Cephalosporin use.   . Pimozide Hives and Itching     Current Outpatient Medications  Medication Sig Dispense Refill  . acetaminophen (TYLENOL) 500 MG tablet Take 500 mg by mouth daily as needed for headache.     . allopurinol (ZYLOPRIM) 100 MG tablet Take 1 tablet (100 mg total) daily by mouth. 30 tablet 0  . ALPRAZolam (XANAX) 0.25 MG tablet Take 0.25 mg by mouth 2 (two) times daily as needed.  0    . amiodarone (PACERONE) 200 MG tablet Take 1 tablet (200 mg total) by mouth daily. 90 tablet 3  . cefdinir (OMNICEF) 300 MG capsule Take 1 capsule (300 mg total) by mouth every 12 (twelve) hours. 10 capsule 0  . DULoxetine (CYMBALTA) 60 MG capsule Take 60 mg by mouth at bedtime.     [name] Kitchen ELIQUIS 5 MG TABS tablet TAKE ONE TABLET BY MOUTH TWICE DAILY. 180 tablet 0  . fluticasone (FLONASE) 50 MCG/ACT nasal spray Place 2 sprays into both nostrils 2 (two) times daily as needed for allergies.     . furosemide (LASIX) 20 MG tablet Take 20 mg by mouth daily as needed for fluid or edema.    . furosemide (LASIX) 40 MG tablet Take 40 mg by mouth daily.  3  . ipratropium-albuterol (DUONEB) 0.5-2.5 (3) MG/3ML SOLN Take 3 mLs by nebulization every 6 (six) hours. 360 mL 0  . isosorbide mononitrate (IMDUR) 30 MG 24 hr tablet Take 1 tablet (30 mg total) by mouth daily. 30 tablet 1  . Magnesium Oxide 400 (240 Mg) MG TABS TAKE ONE TABLET BY MOUTH DAILY. 90 tablet 3  . methocarbamol (ROBAXIN) 500 MG tablet Take 500 mg by mouth every 6 (six) hours as needed for muscle spasms.     . metoprolol (TOPROL-XL) 200 MG 24 hr tablet TAKE ONE TABLET BY MOUTH DAILY. (Patient taking differently: Total daily dose is 250mg ) 90 tablet 0  .  metoprolol succinate (TOPROL-XL) 50 MG 24 hr tablet Take 1 tablet (50 mg total) by mouth daily. (Patient taking differently: Take 50 mg by mouth daily. Total daily dose is 250mg ) 90 tablet 3  . Multiple Vitamin (MULTIVITAMIN WITH MINERALS) TABS tablet Take 1 tablet by mouth every other day. Centrum     . nitroGLYCERIN (NITROSTAT) 0.4 MG SL tablet Place 1 tablet (0.4 mg total) under the tongue every 5 (five) minutes as needed for chest pain. Reported on 08/04/2015 25 tablet 6  . pantoprazole (PROTONIX) 40 MG tablet Take 40 mg by mouth daily.     . potassium chloride (K-DUR,KLOR-CON) 10 MEQ tablet TAKE TWO (2) TABLETS BY MOUTH DAILY. 180 tablet 0  . lisinopril (PRINIVIL,ZESTRIL) 2.5 MG tablet Take 1  tablet (2.5 mg total) by mouth daily. 90 tablet 3   No current facility-administered medications for this visit.      Past Medical History:  Diagnosis Date  . Anxiety   . Arthritis   . Atrial fibrillation ([place])   . Bursitis    Left shoulder  . Cataract   . CHF (congestive heart failure) ([place])   . COPD (chronic obstructive pulmonary disease) ([place])   . Coronary atherosclerosis of native coronary artery    a. s/p DES to LCx in 04/2013 b. cath in 11/2015 showing patent stent with 20% prox-LAD and 80% ostial RCA stenosis for which medical management was recommended due to small artery size  . Depression   . Diastolic heart failure ([facility])    EF 55-60%  . Dysphagia, unspecified(787.20)   . Dyspnea   . Dysrhythmia   . Essential hypertension   . GERD (gastroesophageal reflux disease)    Hx Schatzki's ring, multiple EGD/ED last 01/06/2004  . Gout   . Headache   . History of anemia   . Hyperlipidemia   . Internal hemorrhoids without mention of complication   . MI (myocardial infarction) ([employer]) 2006  . Microscopic colitis 2003  . Panic disorder without agoraphobia   . Paresthesia   . Pneumonia 12/2011  . PVD (peripheral vascular disease) ([place])   . S/P colonoscopy 09/27/2001   internal hemorrhoids, desc colon inflam polyp, SB BX-chronic duodenitis, colitis  . Thyroid disease     ROS:   All systems reviewed and negative except as noted in the HPI.   Past Surgical History:  Procedure Laterality Date  . ABDOMINAL HYSTERECTOMY    . ABDOMINAL HYSTERECTOMY    . ANTERIOR AND POSTERIOR REPAIR     with resection of vagina  . ANTERIOR LAT LUMBAR FUSION N/A 08/01/2016   Procedure: Lumbar Two-Lumbar Five Transpsoas lateral interbody fusion with Lumbar Two-Three lateral plate fixation;  Surgeon: Kevan Ny Ditty, MD;  Location: [facility];  Service: Neurosurgery;  Laterality: N/A;  L2-5 Transpsoas lateral interbody fusion with L2-3 lateral plate fixation  . APPENDECTOMY    . BACK SURGERY      . BIOPSY  07/05/2015   Procedure: BIOPSY;  Surgeon: Daneil Dolin, MD;  [facility];  Service: Endoscopy;;  gastric polyp biopsy, ascending colon biopsy  . BLADDER SUSPENSION  11/09/2011   Procedure: TRANSVAGINAL TAPE (TVT) PROCEDURE;  Surgeon: Marissa Nestle, MD;  Location: [facility];  Service: Urology;  Laterality: N/A;  . bladder tack  06/2010  . BREAST LUMPECTOMY  1998   left, benign  . CARDIAC CATHETERIZATION    . CARDIAC CATHETERIZATION    . CARDIAC CATHETERIZATION N/A 12/16/2015   Procedure: Left Heart Cath and Coronary Angiography;  Surgeon: Troy Sine, MD;  Location: [place];  Service: Cardiovascular;  Laterality: N/A;  . CARDIOVERSION N/A 10/04/2017   Procedure: CARDIOVERSION;  Surgeon: Herminio Commons, MD;  Location: [facility];  Service: Cardiovascular;  Laterality: N/A;  . [place]   left  . CHOLECYSTECTOMY  1998  . Cholecystectomy    . COLONOSCOPY  03/16/2011   multiple hyperplastic colon polyps, sigmoid diverticulosis, melanosis coli  . COLONOSCOPY WITH PROPOFOL N/A 07/05/2015   RMR:one 5 mm polyp in descending colon  . CORONARY ANGIOPLASTY WITH STENT PLACEMENT    . ESOPHAGEAL DILATION N/A 07/05/2015   Procedure: ESOPHAGEAL DILATION;  Surgeon: Daneil Dolin, MD;  [facility];  Service: Endoscopy;  Laterality: N/A;  . ESOPHAGOGASTRODUODENOSCOPY (EGD) WITH PROPOFOL N/A 07/05/2015   JME:QASTMH  . JOINT REPLACEMENT Right 2007   right knee  . left hand surgery    . LEFT HEART CATHETERIZATION WITH CORONARY ANGIOGRAM N/A 05/14/2013   Procedure: LEFT HEART CATHETERIZATION WITH CORONARY ANGIOGRAM;  Surgeon: Blane Ohara, MD;  Location[facility];  Service: Cardiovascular;  Laterality: N/A;  . left rotator cuff surgery    . LUMBAR LAMINECTOMY/DECOMPRESSION MICRODISCECTOMY N/A 10/11/2012   Procedure: LUMBAR LAMINECTOMY/DECOMPRESSION MICRODISCECTOMY 2 LEVELS;  Surgeon: Floyce Stakes, MD;  Location: [facility];   Service: Neurosurgery;  Laterality: N/A;  L3-4 L4-5 Laminectomy  . LUMBAR WOUND DEBRIDEMENT N/A 09/27/2015   Procedure: Exploration of Lumbar Wound w/ Repair CSF Leak/Lumbar Drain Placement;  Surgeon: Leeroy Cha, MD;  Location: [place] [facility];  Service: Neurosurgery;  Laterality: N/A;  . MALONEY DILATION  03/16/2011   Gastritis. No H.pylori on bx. 42F maloney dilation with disruption of  occult cevical esophageal web  . NASAL SINUS SURGERY    . right knee replacement  2007  . right leg benign tumor    . SHOULDER SURGERY Left   . TEE WITHOUT CARDIOVERSION N/A 10/04/2017   Procedure: TRANSESOPHAGEAL ECHOCARDIOGRAM (TEE) WITH PROPOFOL;  Surgeon: Herminio Commons, MD;  Location: [facility];  Service: Cardiovascular;  Laterality: N/A;  . TONSILLECTOMY    . unspecified area, hysterectomy  1972   partial     Family History  Problem Relation Age of Onset  . Stroke Mother   . Parkinson's disease Father   . Coronary artery disease Other        family Hx-sons  . Cancer Other   . Stroke Other        family Hx  . Hypertension Other        family Hx  . Diabetes Brother   . Heart disease Son        before age 61  . Diabetes Son   . Stroke Daughter 55  . Colon cancer Neg Hx      Social History   Socioeconomic History  . Marital status: Divorced    Spouse name: Not on file  . Number of children: 5  . Years of education: Not on file  . Highest education level: Not on file  Occupational History  . Occupation: retired  Scientific laboratory technician  . Financial resource strain: Not on file  . Food insecurity:    Worry: Not on file    Inability: Not on file  . Transportation needs:    Medical: Not on file    Non-medical: Not on file  Tobacco Use  . Smoking status: Former Smoker    Packs/day: 1.00    Years: 64.00  Pack years: 64.00    Types: Cigarettes    Start date: 12/24/1947    Last attempt to quit: 11/17/2001    Years since quitting: 16.1  . Smokeless tobacco: Never Used  . Tobacco  comment: Quit smoking in 2003  Substance and Sexual Activity  . Alcohol use: No    Alcohol/week: 0.0 standard drinks  . Drug use: No  . Sexual activity: Never  Lifestyle  . Physical activity:    Days per week: Not on file    Minutes per session: Not on file  . Stress: Not on file  Relationships  . Social connections:    Talks on phone: Not on file    Gets together: Not on file    Attends religious service: Not on file    Active member of club or organization: Not on file    Attends meetings of clubs or organizations: Not on file    Relationship status: Not on file  . Intimate partner violence:    Fear of current or ex partner: Not on file    Emotionally abused: Not on file    Physically abused: Not on file    Forced sexual activity: Not on file  Other Topics Concern  . Not on file  Social History Narrative   Divorced.   Sister had colon perforation & died from complications in [place], [place]     BP 130/70 (BP Location: Right Arm)   Pulse 78   Ht 5\' 1"  (1.549 m)   Wt 160 lb (72.6 kg)   SpO2 96%   BMI 30.23 kg/m   Physical Exam:  Well appearing NAD HEENT: Unremarkable Neck:  No JVD, no thyromegally Lymphatics:  No adenopathy Back:  No CVA tenderness Lungs:  Clear with no wheezes HEART:  Regular rate rhythm, no murmurs, no rubs, no clicks Abd:  soft, positive bowel sounds, no organomegally, no rebound, no guarding Ext:  2 plus pulses, no edema, no cyanosis, no clubbing Skin:  No rashes no nodules Neuro:  CN II through XII intact, motor grossly intact  EKG - atrial fib with a controlled VR   Assess/Plan: 1. Persistent atrial fib - she has been on amiodarone for the past 6 weeks. We will schedule her a DCCV in the next week or two. 2. Coags - she denies missing any doses of Eliquis. 3. HTN - her blood pressure is reasonably well controlled. 4. Diastolic heart failure - her symptoms are well controlled but hopefully she will feel better in NSR.   Mikle Bosworth.D.

## 2018-01-23 NOTE — Patient Instructions (Signed)
[name]  01/23/2018     @PREFPERIOPPHARMACY @   Your procedure is scheduled on  01/30/2018   Report to [name] at  900  A.M.  Call this number if you have problems the morning of surgery:  [phone]   Remember:  Do not eat or drink after midnight.  You may drink clear liquids until  12 midnight 01/29/2018 .  Clear liquids allowed are:                    Water, Juice (non-citric and without pulp), Carbonated beverages, Clear Tea, Black Coffee only, Plain Jell-O only, Gatorade and Plain Popsicles only    Take these medicines the morning of surgery with A SIP OF WATER * allopurinol, xanax ( if needed), cymbalta, lisinopril, protonix. Use your nebulizer before you come. DO NOT miss any doses of eliquis before your procedure.    Do not wear jewelry, make-up or nail polish.  Do not wear lotions, powders, or perfumes, or deodorant.  Do not shave 48 hours prior to surgery.  Men may shave face and neck.  Do not bring valuables to the hospital.  [facility] is not responsible for any belongings or valuables.  Contacts, dentures or bridgework may not be worn into surgery.  Leave your suitcase in the car.  After surgery it may be brought to your room.  For patients admitted to the hospital, discharge time will be determined by your treatment team.  Patients discharged the day of surgery will not be allowed to drive home.   Name and phone number of your driver:   family Special instructions:  None  Please read over the following fact sheets that you were given. Anesthesia Post-op Instructions and Care and Recovery After Surgery       Electrical Cardioversion Electrical cardioversion is the delivery of a jolt of electricity to restore a normal rhythm to the heart. A rhythm that is too fast or is not regular keeps the heart from pumping well. In this procedure, sticky patches or metal paddles are placed on the chest to deliver electricity to the heart from a  device. This procedure may be done in an emergency if:  There is low or no blood pressure as a result of the heart rhythm.  Normal rhythm must be restored as fast as possible to protect the brain and heart from further damage.  It may save a life.  This procedure may also be done for irregular or fast heart rhythms that are not immediately life-threatening. Tell a health care provider about:  Any allergies you have.  All medicines you are taking, including vitamins, herbs, eye drops, creams, and over-the-counter medicines.  Any problems you or family members have had with anesthetic medicines.  Any blood disorders you have.  Any surgeries you have had.  Any medical conditions you have.  Whether you are pregnant or may be pregnant. What are the risks? Generally, this is a safe procedure. However, problems may occur, including:  Allergic reactions to medicines.  A blood clot that breaks free and travels to other parts of your body.  The possible return of an abnormal heart rhythm within hours or days after the procedure.  Your heart stopping (cardiac arrest). This is rare.  What happens before the procedure? Medicines  Your health care provider may have you start taking: ? Blood-thinning medicines (anticoagulants) so your blood does not clot as easily. ? Medicines  may be given to help stabilize your heart rate and rhythm.  Ask your health care provider about changing or stopping your regular medicines. This is especially important if you are taking diabetes medicines or blood thinners. General instructions  Plan to have someone take you home from the hospital or clinic.  If you will be going home right after the procedure, plan to have someone with you for 24 hours.  Follow instructions from your health care provider about eating or drinking restrictions. What happens during the procedure?  To lower your risk of infection: ? Your health care team will wash or  sanitize their hands. ? Your skin will be washed with soap.  An IV tube will be inserted into one of your veins.  You will be given a medicine to help you relax (sedative).  Sticky patches (electrodes) or metal paddles may be placed on your chest.  An electrical shock will be delivered. The procedure may vary among health care providers and hospitals. What happens after the procedure?  Your blood pressure, heart rate, breathing rate, and blood oxygen level will be monitored until the medicines you were given have worn off.  Do not drive for 24 hours if you were given a sedative.  Your heart rhythm will be watched to make sure it does not change. This information is not intended to replace advice given to you by your health care provider. Make sure you discuss any questions you have with your health care provider. Document Released: 03/24/2002 Document Revised: 12/01/2015 Document Reviewed: 10/08/2015 Elsevier Interactive Patient Education  2017 [employer].  Electrical Cardioversion, Care After This sheet gives you information about how to care for yourself after your procedure. Your health care provider may also give you more specific instructions. If you have problems or questions, contact your health care provider. What can I expect after the procedure? After the procedure, it is common to have:  Some redness on the skin where the shocks were given.  Follow these instructions at home:  Do not drive for 24 hours if you were given a medicine to help you relax (sedative).  Take over-the-counter and prescription medicines only as told by your health care provider.  Ask your health care provider how to check your pulse. Check it often.  Rest for 48 hours after the procedure or as told by your health care provider.  Avoid or limit your caffeine use as told by your health care provider. Contact a health care provider if:  You feel like your heart is beating too quickly or your  pulse is not regular.  You have a serious muscle cramp that does not go away. Get help right away if:  You have discomfort in your chest.  You are dizzy or you feel faint.  You have trouble breathing or you are short of breath.  Your speech is slurred.  You have trouble moving an arm or leg on one side of your body.  Your fingers or toes turn cold or blue. This information is not intended to replace advice given to you by your health care provider. Make sure you discuss any questions you have with your health care provider. Document Released: 01/22/2013 Document Revised: 11/05/2015 Document Reviewed: 10/08/2015 Elsevier Interactive Patient Education  2018 [place] Anesthesia is a term that refers to techniques, procedures, and medicines that help a person stay safe and comfortable during a medical procedure. Monitored anesthesia care, or sedation, is one type of anesthesia.  Your anesthesia specialist may recommend sedation if you will be having a procedure that does not require you to be unconscious, such as:  Cataract surgery.  A dental procedure.  A biopsy.  A colonoscopy.  During the procedure, you may receive a medicine to help you relax (sedative). There are three levels of sedation:  Mild sedation. At this level, you may feel awake and relaxed. You will be able to follow directions.  Moderate sedation. At this level, you will be sleepy. You may not remember the procedure.  Deep sedation. At this level, you will be asleep. You will not remember the procedure.  The more medicine you are given, the deeper your level of sedation will be. Depending on how you respond to the procedure, the anesthesia specialist may change your level of sedation or the type of anesthesia to fit your needs. An anesthesia specialist will monitor you closely during the procedure. Let your health care provider know about:  Any allergies you have.  All medicines  you are taking, including vitamins, herbs, eye drops, creams, and over-the-counter medicines.  Any use of steroids (by mouth or as a cream).  Any problems you or family members have had with sedatives and anesthetic medicines.  Any blood disorders you have.  Any surgeries you have had.  Any medical conditions you have, such as sleep apnea.  Whether you are pregnant or may be pregnant.  Any use of cigarettes, alcohol, or street drugs. What are the risks? Generally, this is a safe procedure. However, problems may occur, including:  Getting too much medicine (oversedation).  Nausea.  Allergic reaction to medicines.  Trouble breathing. If this happens, a breathing tube may be used to help with breathing. It will be removed when you are awake and breathing on your own.  Heart trouble.  Lung trouble.  Before the procedure Staying hydrated Follow instructions from your health care provider about hydration, which may include:  Up to 2 hours before the procedure - you may continue to drink clear liquids, such as water, clear fruit juice, black coffee, and plain tea.  Eating and drinking restrictions Follow instructions from your health care provider about eating and drinking, which may include:  8 hours before the procedure - stop eating heavy meals or foods such as meat, fried foods, or fatty foods.  6 hours before the procedure - stop eating light meals or foods, such as toast or cereal.  6 hours before the procedure - stop drinking milk or drinks that contain milk.  2 hours before the procedure - stop drinking clear liquids.  Medicines Ask your health care provider about:  Changing or stopping your regular medicines. This is especially important if you are taking diabetes medicines or blood thinners.  Taking medicines such as aspirin and ibuprofen. These medicines can thin your blood. Do not take these medicines before your procedure if your health care provider  instructs you not to.  Tests and exams  You will have a physical exam.  You may have blood tests done to show: ? How well your kidneys and liver are working. ? How well your blood can clot.  General instructions  Plan to have someone take you home from the hospital or clinic.  If you will be going home right after the procedure, plan to have someone with you for 24 hours.  What happens during the procedure?  Your blood pressure, heart rate, breathing, level of pain and overall condition will be monitored.  An IV  tube will be inserted into one of your veins.  Your anesthesia specialist will give you medicines as needed to keep you comfortable during the procedure. This may mean changing the level of sedation.  The procedure will be performed. After the procedure  Your blood pressure, heart rate, breathing rate, and blood oxygen level will be monitored until the medicines you were given have worn off.  Do not drive for 24 hours if you received a sedative.  You may: ? Feel sleepy, clumsy, or nauseous. ? Feel forgetful about what happened after the procedure. ? Have a sore throat if you had a breathing tube during the procedure. ? Vomit. This information is not intended to replace advice given to you by your health care provider. Make sure you discuss any questions you have with your health care provider. Document Released: 12/28/2004 Document Revised: 09/10/2015 Document Reviewed: 07/25/2015 Elsevier Interactive Patient Education  2018 [place], Care After These instructions provide you with information about caring for yourself after your procedure. Your health care provider may also give you more specific instructions. Your treatment has been planned according to current medical practices, but problems sometimes occur. Call your health care provider if you have any problems or questions after your procedure. What can I expect after the  procedure? After your procedure, it is common to:  Feel sleepy for several hours.  Feel clumsy and have poor balance for several hours.  Feel forgetful about what happened after the procedure.  Have poor judgment for several hours.  Feel nauseous or vomit.  Have a sore throat if you had a breathing tube during the procedure.  Follow these instructions at home: For at least 24 hours after the procedure:   Do not: ? Participate in activities in which you could fall or become injured. ? Drive. ? Use heavy machinery. ? Drink alcohol. ? Take sleeping pills or medicines that cause drowsiness. ? Make important decisions or sign legal documents. ? Take care of children on your own.  Rest. Eating and drinking  Follow the diet that is recommended by your health care provider.  If you vomit, drink water, juice, or soup when you can drink without vomiting.  Make sure you have little or no nausea before eating solid foods. General instructions  Have a responsible adult stay with you until you are awake and alert.  Take over-the-counter and prescription medicines only as told by your health care provider.  If you smoke, do not smoke without supervision.  Keep all follow-up visits as told by your health care provider. This is important. Contact a health care provider if:  You keep feeling nauseous or you keep vomiting.  You feel light-headed.  You develop a rash.  You have a fever. Get help right away if:  You have trouble breathing. This information is not intended to replace advice given to you by your health care provider. Make sure you discuss any questions you have with your health care provider. Document Released: 07/25/2015 Document Revised: 11/24/2015 Document Reviewed: 07/25/2015 Elsevier Interactive Patient Education  [employer].

## 2018-01-28 ENCOUNTER — Encounter (HOSPITAL_COMMUNITY)
Admission: RE | Admit: 2018-01-28 | Discharge: 2018-01-28 | Disposition: A | Discharge status: Home / Self Care | Payer: Medicare Other | Source: Ambulatory Visit | Attending: Cardiovascular Disease | Admitting: Cardiovascular Disease

## 2018-01-28 ENCOUNTER — Other Ambulatory Visit: Payer: Self-pay

## 2018-01-28 ENCOUNTER — Other Ambulatory Visit: Payer: Self-pay | Admitting: Cardiovascular Disease

## 2018-01-28 ENCOUNTER — Encounter (HOSPITAL_COMMUNITY): Payer: Self-pay

## 2018-01-28 DIAGNOSIS — I2510 Atherosclerotic heart disease of native coronary artery without angina pectoris: Secondary | ICD-10-CM | POA: Diagnosis not present

## 2018-01-28 DIAGNOSIS — I252 Old myocardial infarction: Secondary | ICD-10-CM | POA: Diagnosis not present

## 2018-01-28 DIAGNOSIS — M109 Gout, unspecified: Secondary | ICD-10-CM | POA: Diagnosis not present

## 2018-01-28 DIAGNOSIS — Z90710 Acquired absence of both cervix and uterus: Secondary | ICD-10-CM | POA: Diagnosis not present

## 2018-01-28 DIAGNOSIS — Z885 Allergy status to narcotic agent status: Secondary | ICD-10-CM | POA: Diagnosis not present

## 2018-01-28 DIAGNOSIS — F419 Anxiety disorder, unspecified: Secondary | ICD-10-CM | POA: Diagnosis not present

## 2018-01-28 DIAGNOSIS — Z96651 Presence of right artificial knee joint: Secondary | ICD-10-CM | POA: Diagnosis not present

## 2018-01-28 DIAGNOSIS — Z01812 Encounter for preprocedural laboratory examination: Secondary | ICD-10-CM

## 2018-01-28 DIAGNOSIS — F329 Major depressive disorder, single episode, unspecified: Secondary | ICD-10-CM | POA: Diagnosis not present

## 2018-01-28 DIAGNOSIS — I739 Peripheral vascular disease, unspecified: Secondary | ICD-10-CM | POA: Diagnosis not present

## 2018-01-28 DIAGNOSIS — M1990 Unspecified osteoarthritis, unspecified site: Secondary | ICD-10-CM | POA: Diagnosis not present

## 2018-01-28 DIAGNOSIS — K219 Gastro-esophageal reflux disease without esophagitis: Secondary | ICD-10-CM | POA: Diagnosis not present

## 2018-01-28 DIAGNOSIS — Z7951 Long term (current) use of inhaled steroids: Secondary | ICD-10-CM | POA: Diagnosis not present

## 2018-01-28 DIAGNOSIS — J449 Chronic obstructive pulmonary disease, unspecified: Secondary | ICD-10-CM | POA: Diagnosis not present

## 2018-01-28 DIAGNOSIS — Z79899 Other long term (current) drug therapy: Secondary | ICD-10-CM | POA: Diagnosis not present

## 2018-01-28 DIAGNOSIS — Z955 Presence of coronary angioplasty implant and graft: Secondary | ICD-10-CM | POA: Diagnosis not present

## 2018-01-28 DIAGNOSIS — I4819 Other persistent atrial fibrillation: Secondary | ICD-10-CM | POA: Diagnosis not present

## 2018-01-28 DIAGNOSIS — E079 Disorder of thyroid, unspecified: Secondary | ICD-10-CM | POA: Diagnosis not present

## 2018-01-28 DIAGNOSIS — I110 Hypertensive heart disease with heart failure: Secondary | ICD-10-CM | POA: Diagnosis not present

## 2018-01-28 DIAGNOSIS — Z87891 Personal history of nicotine dependence: Secondary | ICD-10-CM | POA: Diagnosis not present

## 2018-01-28 DIAGNOSIS — Z888 Allergy status to other drugs, medicaments and biological substances status: Secondary | ICD-10-CM | POA: Diagnosis not present

## 2018-01-28 DIAGNOSIS — Z7901 Long term (current) use of anticoagulants: Secondary | ICD-10-CM | POA: Diagnosis not present

## 2018-01-28 DIAGNOSIS — Z880 Allergy status to penicillin: Secondary | ICD-10-CM | POA: Diagnosis not present

## 2018-01-28 DIAGNOSIS — I5042 Chronic combined systolic (congestive) and diastolic (congestive) heart failure: Secondary | ICD-10-CM | POA: Diagnosis not present

## 2018-01-28 DIAGNOSIS — Z881 Allergy status to other antibiotic agents status: Secondary | ICD-10-CM | POA: Diagnosis not present

## 2018-01-28 LAB — CBC WITH DIFFERENTIAL/PLATELET
Abs Immature Granulocytes: 0.05 10*3/uL (ref 0.00–0.07)
BASOS ABS: 0.1 10*3/uL (ref 0.0–0.1)
Basophils Relative: 1 %
EOS PCT: 3 %
Eosinophils Absolute: 0.2 10*3/uL (ref 0.0–0.5)
HCT: 35 % — ABNORMAL LOW (ref 36.0–46.0)
HEMOGLOBIN: 10.8 g/dL — AB (ref 12.0–15.0)
IMMATURE GRANULOCYTES: 1 %
LYMPHS PCT: 34 %
Lymphs Abs: 2.4 10*3/uL (ref 0.7–4.0)
MCH: 28.3 pg (ref 26.0–34.0)
MCHC: 30.9 g/dL (ref 30.0–36.0)
MCV: 91.6 fL (ref 80.0–100.0)
Monocytes Absolute: 0.5 10*3/uL (ref 0.1–1.0)
Monocytes Relative: 8 %
NEUTROS PCT: 53 %
NRBC: 0 % (ref 0.0–0.2)
Neutro Abs: 3.7 10*3/uL (ref 1.7–7.7)
Platelets: 313 10*3/uL (ref 150–400)
RBC: 3.82 MIL/uL — ABNORMAL LOW (ref 3.87–5.11)
RDW: 14.6 % (ref 11.5–15.5)
WBC: 6.9 10*3/uL (ref 4.0–10.5)

## 2018-01-28 LAB — BASIC METABOLIC PANEL
Anion gap: 7 (ref 5–15)
BUN: 23 mg/dL (ref 8–23)
CALCIUM: 9 mg/dL (ref 8.9–10.3)
CO2: 28 mmol/L (ref 22–32)
CREATININE: 1.53 mg/dL — AB (ref 0.44–1.00)
Chloride: 102 mmol/L (ref 98–111)
GFR calc non Af Amer: 30 mL/min — ABNORMAL LOW (ref >60)
GFR, EST AFRICAN AMERICAN: 35 mL/min — AB (ref >60)
GLUCOSE: 88 mg/dL (ref 70–99)
Potassium: 4.8 mmol/L (ref 3.5–5.1)
SODIUM: 137 mmol/L (ref 135–145)

## 2018-01-30 ENCOUNTER — Ambulatory Visit (HOSPITAL_COMMUNITY)
Admission: RE | Admit: 2018-01-30 | Discharge: 2018-01-30 | Disposition: A | Discharge status: Home / Self Care | Payer: Medicare Other | Source: Ambulatory Visit | Attending: Cardiovascular Disease | Admitting: Cardiovascular Disease

## 2018-01-30 ENCOUNTER — Encounter (HOSPITAL_COMMUNITY)
Admission: RE | Disposition: A | Discharge status: Home / Self Care | Payer: Self-pay | Source: Ambulatory Visit | Attending: Cardiovascular Disease

## 2018-01-30 ENCOUNTER — Ambulatory Visit (HOSPITAL_COMMUNITY): Payer: Medicare Other | Admitting: Anesthesiology

## 2018-01-30 ENCOUNTER — Other Ambulatory Visit: Payer: Self-pay

## 2018-01-30 ENCOUNTER — Telehealth: Payer: Self-pay

## 2018-01-30 ENCOUNTER — Encounter (HOSPITAL_COMMUNITY): Payer: Self-pay

## 2018-01-30 DIAGNOSIS — Z96651 Presence of right artificial knee joint: Secondary | ICD-10-CM | POA: Insufficient documentation

## 2018-01-30 DIAGNOSIS — F419 Anxiety disorder, unspecified: Secondary | ICD-10-CM | POA: Insufficient documentation

## 2018-01-30 DIAGNOSIS — I4819 Other persistent atrial fibrillation: Secondary | ICD-10-CM | POA: Diagnosis not present

## 2018-01-30 DIAGNOSIS — I5042 Chronic combined systolic (congestive) and diastolic (congestive) heart failure: Secondary | ICD-10-CM | POA: Insufficient documentation

## 2018-01-30 DIAGNOSIS — Z888 Allergy status to other drugs, medicaments and biological substances status: Secondary | ICD-10-CM | POA: Insufficient documentation

## 2018-01-30 DIAGNOSIS — M109 Gout, unspecified: Secondary | ICD-10-CM | POA: Insufficient documentation

## 2018-01-30 DIAGNOSIS — Z79899 Other long term (current) drug therapy: Secondary | ICD-10-CM | POA: Insufficient documentation

## 2018-01-30 DIAGNOSIS — I2510 Atherosclerotic heart disease of native coronary artery without angina pectoris: Secondary | ICD-10-CM | POA: Diagnosis not present

## 2018-01-30 DIAGNOSIS — Z87891 Personal history of nicotine dependence: Secondary | ICD-10-CM | POA: Diagnosis not present

## 2018-01-30 DIAGNOSIS — F329 Major depressive disorder, single episode, unspecified: Secondary | ICD-10-CM | POA: Insufficient documentation

## 2018-01-30 DIAGNOSIS — I252 Old myocardial infarction: Secondary | ICD-10-CM | POA: Diagnosis not present

## 2018-01-30 DIAGNOSIS — M1990 Unspecified osteoarthritis, unspecified site: Secondary | ICD-10-CM | POA: Insufficient documentation

## 2018-01-30 DIAGNOSIS — Z7901 Long term (current) use of anticoagulants: Secondary | ICD-10-CM | POA: Insufficient documentation

## 2018-01-30 DIAGNOSIS — R079 Chest pain, unspecified: Secondary | ICD-10-CM | POA: Diagnosis not present

## 2018-01-30 DIAGNOSIS — N183 Chronic kidney disease, stage 3 (moderate): Secondary | ICD-10-CM | POA: Diagnosis not present

## 2018-01-30 DIAGNOSIS — Z7951 Long term (current) use of inhaled steroids: Secondary | ICD-10-CM | POA: Insufficient documentation

## 2018-01-30 DIAGNOSIS — I4891 Unspecified atrial fibrillation: Secondary | ICD-10-CM | POA: Diagnosis not present

## 2018-01-30 DIAGNOSIS — Z880 Allergy status to penicillin: Secondary | ICD-10-CM | POA: Insufficient documentation

## 2018-01-30 DIAGNOSIS — I739 Peripheral vascular disease, unspecified: Secondary | ICD-10-CM | POA: Insufficient documentation

## 2018-01-30 DIAGNOSIS — Z8249 Family history of ischemic heart disease and other diseases of the circulatory system: Secondary | ICD-10-CM | POA: Insufficient documentation

## 2018-01-30 DIAGNOSIS — Z881 Allergy status to other antibiotic agents status: Secondary | ICD-10-CM | POA: Insufficient documentation

## 2018-01-30 DIAGNOSIS — I110 Hypertensive heart disease with heart failure: Secondary | ICD-10-CM | POA: Insufficient documentation

## 2018-01-30 DIAGNOSIS — Z90710 Acquired absence of both cervix and uterus: Secondary | ICD-10-CM | POA: Insufficient documentation

## 2018-01-30 DIAGNOSIS — I509 Heart failure, unspecified: Secondary | ICD-10-CM | POA: Diagnosis not present

## 2018-01-30 DIAGNOSIS — K219 Gastro-esophageal reflux disease without esophagitis: Secondary | ICD-10-CM | POA: Diagnosis not present

## 2018-01-30 DIAGNOSIS — I10 Essential (primary) hypertension: Secondary | ICD-10-CM | POA: Diagnosis not present

## 2018-01-30 DIAGNOSIS — Z885 Allergy status to narcotic agent status: Secondary | ICD-10-CM | POA: Insufficient documentation

## 2018-01-30 DIAGNOSIS — I482 Chronic atrial fibrillation, unspecified: Secondary | ICD-10-CM | POA: Diagnosis not present

## 2018-01-30 DIAGNOSIS — E079 Disorder of thyroid, unspecified: Secondary | ICD-10-CM | POA: Insufficient documentation

## 2018-01-30 DIAGNOSIS — J449 Chronic obstructive pulmonary disease, unspecified: Secondary | ICD-10-CM | POA: Insufficient documentation

## 2018-01-30 DIAGNOSIS — R300 Dysuria: Secondary | ICD-10-CM | POA: Diagnosis not present

## 2018-01-30 DIAGNOSIS — Z955 Presence of coronary angioplasty implant and graft: Secondary | ICD-10-CM | POA: Insufficient documentation

## 2018-01-30 HISTORY — PX: CARDIOVERSION: SHX1299

## 2018-01-30 SURGERY — CARDIOVERSION
Anesthesia: Monitor Anesthesia Care

## 2018-01-30 MED ORDER — METOPROLOL SUCCINATE ER 50 MG PO TB24: 50.0000 mg | ORAL_TABLET | Freq: Every day | ORAL | 3 refills | Status: DC

## 2018-01-30 MED ORDER — LACTATED RINGERS IV SOLN
INTRAVENOUS | Status: DC
  Administered 2018-01-30: 400 mL via INTRAVENOUS

## 2018-01-30 MED ORDER — PROPOFOL 500 MG/50ML IV EMUL
INTRAVENOUS | Status: DC | PRN
  Administered 2018-01-30: 75 ug/kg/min via INTRAVENOUS

## 2018-01-30 MED ORDER — PROPOFOL 10 MG/ML IV BOLUS
INTRAVENOUS | Status: DC | PRN
  Administered 2018-01-30: 20 mg via INTRAVENOUS

## 2018-01-30 NOTE — Interval H&P Note (Signed)
History and Physical Interval Note: No interval changes. Will proceed with DCCV as planned.  01/30/2018 8:53 AM  [name]  has presented today for surgery, with the diagnosis of a-fib  The various methods of treatment have been discussed with the patient and family. After consideration of risks, benefits and other options for treatment, the patient has consented to  Procedure(s): CARDIOVERSION (N/A) as a surgical intervention .  The patient's history has been reviewed, patient examined, no change in status, stable for surgery.  I have reviewed the patient's chart and labs.  Questions were answered to the patient's satisfaction.     Kate Sable

## 2018-01-30 NOTE — Anesthesia Procedure Notes (Signed)
Procedure Name: MAC Date/Time: 01/30/2018 10:42 AM Performed by: Vista Deck, CRNA Pre-anesthesia Checklist: Patient identified, Emergency Drugs available, Suction available, Timeout performed and Patient being monitored Patient Re-evaluated:Patient Re-evaluated prior to induction Oxygen Delivery Method: Nasal Cannula

## 2018-01-30 NOTE — Transfer of Care (Signed)
Immediate Anesthesia Transfer of Care Note  Patient: [name]  Procedure(s) Performed: CARDIOVERSION (N/A )  Patient Location: PACU  Anesthesia Type:MAC  Level of Consciousness: awake and patient cooperative  Airway & Oxygen Therapy: Patient Spontanous Breathing and Patient connected to nasal cannula oxygen  Post-op Assessment: Report given to RN and Post -op Vital signs reviewed and stable  Post vital signs: Reviewed and stable  Last Vitals:  Vitals Value Taken Time  BP    Temp    Pulse    Resp    SpO2      Last Pain:  Vitals:   01/30/18 0856  TempSrc: Oral         Complications: No apparent anesthesia complications

## 2018-01-30 NOTE — Anesthesia Postprocedure Evaluation (Signed)
Anesthesia Post Note  Patient: [name]  Procedure(s) Performed: CARDIOVERSION (N/A )  Patient location during evaluation: PACU Anesthesia Type: MAC Level of consciousness: awake and alert and patient cooperative Pain management: satisfactory to patient Vital Signs Assessment: post-procedure vital signs reviewed and stable Respiratory status: spontaneous breathing Cardiovascular status: stable Postop Assessment: no apparent nausea or vomiting Anesthetic complications: no     Last Vitals:  Vitals:   01/30/18 1115 01/30/18 1130  BP: 131/62 (!) 145/67  Pulse:  (!) 57  Resp:  18  Temp:    SpO2:  100%    Last Pain:  Vitals:   01/30/18 1130  TempSrc:   PainSc: 0-No pain                 [name]

## 2018-01-30 NOTE — Procedures (Signed)
Elective direct current cardioversion  Indication: Persistent atrial fibrillation  Description of procedure: After informed consent was obtained and preprocedure evaluation by Anesthesia, patient was taken to the procedure room. Timeout performed. Sedation was managed completely by the Anesthesia service, please refer to their documentation for details. Anterior and posterior chest pads were placed and connected to a biphasic defibrillator. A single synchronized shock of 150J was delivered resulting in restoration of sinus rhythm. The patient remained hemodynamically stable throughout, and there were no immediate complications noted. Follow-up ECG obtained.  Kate Sable, M.D., F.A.C.C.

## 2018-01-30 NOTE — Telephone Encounter (Signed)
-----   Message from Herminio Commons, MD sent at 01/30/2018 11:41 AM EDT ----- Regarding: Reduce Toprol Xl to 50 mg daily Hi, I just did DCCV on this patient. Please reduce Toprol Xl to 50 mg daily starting tomorrow. Inform patient and family please. Thanks.

## 2018-01-30 NOTE — Anesthesia Preprocedure Evaluation (Signed)
Anesthesia Evaluation  Patient identified by MRN, date of birth, ID band Patient awake    Reviewed: Allergy & Precautions, H&P , NPO status , Patient's Chart, lab work & pertinent test results, reviewed documented beta blocker date and time   Airway Mallampati: I  TM Distance: >3 FB Neck ROM: full    Dental no notable dental hx.    Pulmonary shortness of breath, pneumonia, COPD, former smoker,    Pulmonary exam normal breath sounds clear to auscultation       Cardiovascular Exercise Tolerance: Good hypertension, + angina + CAD, + Past MI, + Peripheral Vascular Disease, +CHF and + DOE  + dysrhythmias  Rhythm:regular Rate:Normal     Neuro/Psych  Headaches, PSYCHIATRIC DISORDERS Anxiety Depression  Neuromuscular disease    GI/Hepatic Neg liver ROS, GERD  ,  Endo/Other  negative endocrine ROS  Renal/GU Renal disease  negative genitourinary   Musculoskeletal   Abdominal   Peds  Hematology  (+) Blood dyscrasia, anemia ,   Anesthesia Other Findings   Reproductive/Obstetrics negative OB ROS                             Anesthesia Physical Anesthesia Plan  ASA: III  Anesthesia Plan: MAC   Post-op Pain Management:    Induction:   PONV Risk Score and Plan:   Airway Management Planned:   Additional Equipment:   Intra-op Plan:   Post-operative Plan:   Informed Consent: I have reviewed the patients History and Physical, chart, labs and discussed the procedure including the risks, benefits and alternatives for the proposed anesthesia with the patient or authorized representative who has indicated his/her understanding and acceptance.   Dental Advisory Given  Plan Discussed with: CRNA  Anesthesia Plan Comments:         Anesthesia Quick Evaluation

## 2018-01-30 NOTE — Progress Notes (Signed)
Telephone call to Dr. Jacinta Shoe.  Patient may resume all current medications. Dr. Jacinta Shoe "I am decreasing her Toprol 200mg  to 50mg  once daily and I will have my nurse take care it".

## 2018-01-30 NOTE — Discharge Instructions (Signed)
PER DR. Jacinta Shoe HE IS DECREASING YOUR TOPROL FROM 200MG  TO 50MG  ONCE DAILY, PLEASE CALL CARDIOLOGY DEPARTMENT FOR FURTHER PRESCRIPTION REFILLS IF NEEDED    Monitored Anesthesia Care, Care After These instructions provide you with information about caring for yourself after your procedure. Your health care provider may also give you more specific instructions. Your treatment has been planned according to current medical practices, but problems sometimes occur. Call your health care provider if you have any problems or questions after your procedure. What can I expect after the procedure? After your procedure, it is common to:  Feel sleepy for several hours.  Feel clumsy and have poor balance for several hours.  Feel forgetful about what happened after the procedure.  Have poor judgment for several hours.  Feel nauseous or vomit.  Have a sore throat if you had a breathing tube during the procedure.  Follow these instructions at home: For at least 24 hours after the procedure:   Do not: ? Participate in activities in which you could fall or become injured. ? Drive. ? Use heavy machinery. ? Drink alcohol. ? Take sleeping pills or medicines that cause drowsiness. ? Make important decisions or sign legal documents. ? Take care of children on your own.  Rest. Eating and drinking  Follow the diet that is recommended by your health care provider.  If you vomit, drink water, juice, or soup when you can drink without vomiting.  Make sure you have little or no nausea before eating solid foods. General instructions  Have a responsible adult stay with you until you are awake and alert.  Take over-the-counter and prescription medicines only as told by your health care provider.  If you smoke, do not smoke without supervision.  Keep all follow-up visits as told by your health care provider. This is important. Contact a health care provider if:  You keep feeling nauseous or you  keep vomiting.  You feel light-headed.  You develop a rash.  You have a fever. Get help right away if:  You have trouble breathing. This information is not intended to replace advice given to you by your health care provider. Make sure you discuss any questions you have with your health care provider. Document Released: 07/25/2015 Document Revised: 11/24/2015 Document Reviewed: 07/25/2015 Elsevier Interactive Patient Education  2018 [employer].      Electrical Cardioversion, Care After This sheet gives you information about how to care for yourself after your procedure. Your health care provider may also give you more specific instructions. If you have problems or questions, contact your health care provider. What can I expect after the procedure? After the procedure, it is common to have:  Some redness on the skin where the shocks were given.  Follow these instructions at home:  Do not drive for 24 hours if you were given a medicine to help you relax (sedative).  Take over-the-counter and prescription medicines only as told by your health care provider.  Ask your health care provider how to check your pulse. Check it often.  Rest for 48 hours after the procedure or as told by your health care provider.  Avoid or limit your caffeine use as told by your health care provider. Contact a health care provider if:  You feel like your heart is beating too quickly or your pulse is not regular.  You have a serious muscle cramp that does not go away. Get help right away if:  You have discomfort in your chest.  You  are dizzy or you feel faint.  You have trouble breathing or you are short of breath.  Your speech is slurred.  You have trouble moving an arm or leg on one side of your body.  Your fingers or toes turn cold or blue. This information is not intended to replace advice given to you by your health care provider. Make sure you discuss any questions you have with  your health care provider. Document Released: 01/22/2013 Document Revised: 11/05/2015 Document Reviewed: 10/08/2015 Elsevier Interactive Patient Education  [employer].

## 2018-01-30 NOTE — Telephone Encounter (Signed)
Pt's son returned call, informed him of dose change. He voiced understanding.

## 2018-01-30 NOTE — Telephone Encounter (Signed)
Called pt,. No answer,no voicemail. Sent in RX for 50 mg daily. Will call pt again later.

## 2018-01-30 NOTE — Progress Notes (Signed)
Electrical Cardioversion Procedure Note [name] [id number] 19-Feb-1933  Procedure: Electrical Cardioversion Indications: Atrial Fibrillation  Procedure Details Consent: yes Time Out: Verified patient identification, verified procedure, site/side was marked, verified correct patient position, special equipment/implants available, medications/allergies/relevent history reviewed, required imaging and test results available.   Time out: 1047  Patient placed on cardiac monitor, pulse oximetry, supplemental oxygen as necessary.  Sedation given: yes Pacer pads placed yes  Cardioverted 1 time(s).  Cardioverted at 150 joules  Evaluation Findings: Post procedure EKG shows: Normal Sinus Rhythm Complications: none Patient did tolerate procedure well.   Hillery Jacks 01/30/2018, 11:07 AM

## 2018-02-04 ENCOUNTER — Encounter (HOSPITAL_COMMUNITY): Payer: Self-pay | Admitting: Cardiovascular Disease

## 2018-02-07 DIAGNOSIS — I482 Chronic atrial fibrillation, unspecified: Secondary | ICD-10-CM | POA: Diagnosis not present

## 2018-02-07 DIAGNOSIS — M109 Gout, unspecified: Secondary | ICD-10-CM | POA: Diagnosis not present

## 2018-02-07 DIAGNOSIS — M25512 Pain in left shoulder: Secondary | ICD-10-CM | POA: Diagnosis not present

## 2018-02-07 DIAGNOSIS — K649 Unspecified hemorrhoids: Secondary | ICD-10-CM | POA: Diagnosis not present

## 2018-02-07 DIAGNOSIS — I5033 Acute on chronic diastolic (congestive) heart failure: Secondary | ICD-10-CM | POA: Diagnosis not present

## 2018-02-07 DIAGNOSIS — Z6830 Body mass index (BMI) 30.0-30.9, adult: Secondary | ICD-10-CM | POA: Diagnosis not present

## 2018-02-07 DIAGNOSIS — I10 Essential (primary) hypertension: Secondary | ICD-10-CM | POA: Diagnosis not present

## 2018-02-07 DIAGNOSIS — N183 Chronic kidney disease, stage 3 (moderate): Secondary | ICD-10-CM | POA: Diagnosis not present

## 2018-02-22 ENCOUNTER — Other Ambulatory Visit (HOSPITAL_COMMUNITY)
Admission: RE | Admit: 2018-02-22 | Discharge: 2018-02-22 | Disposition: A | Discharge status: Home / Self Care | Payer: Medicare Other | Source: Ambulatory Visit | Attending: Cardiovascular Disease | Admitting: Cardiovascular Disease

## 2018-02-22 ENCOUNTER — Ambulatory Visit (INDEPENDENT_AMBULATORY_CARE_PROVIDER_SITE_OTHER): Payer: Medicare Other | Admitting: Cardiovascular Disease

## 2018-02-22 ENCOUNTER — Telehealth: Payer: Self-pay

## 2018-02-22 ENCOUNTER — Encounter: Payer: Self-pay | Admitting: Cardiovascular Disease

## 2018-02-22 VITALS — BP 123/59 | HR 73 | Ht 61.0 in | Wt 162.0 lb

## 2018-02-22 DIAGNOSIS — N183 Chronic kidney disease, stage 3 unspecified: Secondary | ICD-10-CM

## 2018-02-22 DIAGNOSIS — Z79899 Other long term (current) drug therapy: Secondary | ICD-10-CM

## 2018-02-22 DIAGNOSIS — R7989 Other specified abnormal findings of blood chemistry: Secondary | ICD-10-CM

## 2018-02-22 DIAGNOSIS — Z955 Presence of coronary angioplasty implant and graft: Secondary | ICD-10-CM

## 2018-02-22 DIAGNOSIS — I25118 Atherosclerotic heart disease of native coronary artery with other forms of angina pectoris: Secondary | ICD-10-CM

## 2018-02-22 DIAGNOSIS — I10 Essential (primary) hypertension: Secondary | ICD-10-CM | POA: Diagnosis not present

## 2018-02-22 DIAGNOSIS — I5042 Chronic combined systolic (congestive) and diastolic (congestive) heart failure: Secondary | ICD-10-CM | POA: Diagnosis not present

## 2018-02-22 DIAGNOSIS — I4811 Longstanding persistent atrial fibrillation: Secondary | ICD-10-CM | POA: Diagnosis not present

## 2018-02-22 DIAGNOSIS — R0609 Other forms of dyspnea: Secondary | ICD-10-CM | POA: Diagnosis not present

## 2018-02-22 DIAGNOSIS — E785 Hyperlipidemia, unspecified: Secondary | ICD-10-CM | POA: Diagnosis not present

## 2018-02-22 DIAGNOSIS — I200 Unstable angina: Secondary | ICD-10-CM

## 2018-02-22 LAB — HEPATIC FUNCTION PANEL
ALBUMIN: 4 g/dL (ref 3.5–5.0)
ALT: 25 U/L (ref 0–44)
AST: 29 U/L (ref 15–41)
Alkaline Phosphatase: 108 U/L (ref 38–126)
Bilirubin, Direct: <0.1 mg/dL (ref 0.0–0.2)
TOTAL PROTEIN: 7.9 g/dL (ref 6.5–8.1)
Total Bilirubin: 0.5 mg/dL (ref 0.3–1.2)

## 2018-02-22 LAB — TSH: TSH: 7.002 u[IU]/mL — ABNORMAL HIGH (ref 0.350–4.500)

## 2018-02-22 MED ORDER — ISOSORBIDE MONONITRATE ER 60 MG PO TB24: 60.0000 mg | ORAL_TABLET | Freq: Every day | ORAL | 3 refills | Status: DC

## 2018-02-22 NOTE — Telephone Encounter (Signed)
-----   Message from Herminio Commons, MD sent at 02/22/2018  2:15 PM EST ----- Liver transaminases normal.  TSH is mildly elevated.  At this time I will not pursue treatment.  Please repeat in 2 months.

## 2018-02-22 NOTE — Patient Instructions (Signed)
Medication Instructions:  INCREASE Imdur to 60 mg daily  If you need a refill on your cardiac medications before your next appointment, please call your pharmacy.   Lab work: Get TSH and LFT's today  If you have labs (blood work) drawn today and your tests are completely normal, you will receive your results only by: [name] Kitchen MyChart Message (if you have MyChart) OR . A paper copy in the mail If you have any lab test that is abnormal or we need to change your treatment, we will call you to review the results.  Testing/Procedures: NONE  Follow-Up: At [facility], you and your health needs are our priority.  As part of our continuing mission to provide you with exceptional heart care, we have created designated Provider Care Teams.  These Care Teams include your primary Cardiologist (physician) and Advanced Practice Providers (APPs -  Physician Assistants and Nurse Practitioners) who all work together to provide you with the care you need, when you need it. You will need a follow up appointment in 11 months (August 2020).  Please call our office 2 months in advance to schedule this appointment.  You may see Kate Sable, MD or one of the following Advanced Practice Providers on your designated Care Team:   Bernerd Pho, PA-C [facility]) . Ermalinda Barrios, PA-C ([place])  Any Other Special Instructions Will Be Listed Below (If Applicable). NONE

## 2018-02-22 NOTE — Progress Notes (Signed)
SUBJECTIVE: The patient presents for routine follow-up.  I performed direct-current cardioversion for persistent atrial fibrillation on 01/30/2018 and reduced Toprol-XL to 50 mg daily.  I previously performed TEE and successful cardioversion on 10/04/2017.  TEE showed mild to moderately reduced left ventricular systolic function, LVEF 40 to 45%.  There was diffuse hypokinesis.  There was mild aortic and mitral regurgitation.  The left atrium was moderate to severely dilated.  There was no intracardiac thrombus.  She also has a history of coronary artery disease and underwent percutaneous coronary intervention with drug-eluting stent to the left circumflex coronary artery in January 2015.  Coronary angiography 12/16/15 showed 20% stenosis in the proximal LAD and in the inferior branch of the first diagonal with a widely patent previously placed circumflex stent. RCA was small and nondominant with 80% ostial stenosis. Medical therapy was recommended.  Nuclear stress test was normal on 07/02/2017.  She continues to have exertional dyspnea.  She started smoking at the age of 58 and quit in 2003.  Last PFTs were in May 2016.  She has chronic left inframammary chest pains.  They are primarily present at rest.  She denies orthopnea, palpitations, leg swelling.  She has some left shoulder pain.    Review of Systems: As per "subjective", otherwise negative.  Allergies  Allergen Reactions  . Cephalosporins Diarrhea and Nausea Only    Lightheaded  . Levaquin [Levofloxacin In D5w] Swelling  . Macrodantin [Nitrofurantoin Macrocrystal] Swelling  . Phenothiazines Anaphylaxis and Hives  . Polysorbate Anaphylaxis  . Prednisone Shortness Of Breath  . Buspirone Itching  . Cardura [Doxazosin Mesylate] Itching  . Codeine Itching  . Acyclovir And Related Itching    Redness of skin  . Prochlorperazine Other (See Comments)    "Upset stomach"  . Ranexa [Ranolazine]     Severe drop in BP  .  Atorvastatin Hives    Cramping; tolerates Crestor ok  . Ofloxacin Rash  . Other Itching and Rash    "WOOL"= make skin look like it has been burned  . Penicillins Other (See Comments)    Causes redness all over. Has patient had a PCN reaction causing immediate rash, facial/tongue/throat swelling, SOB or lightheadedness with hypotension: No Has patient had a PCN reaction causing severe rash involving mucus membranes or skin necrosis: No Has patient had a PCN reaction that required hospitalization No Has patient had a PCN reaction occurring within the last 10 years: No If all of the above answers are "NO", then may proceed with Cephalosporin use.   . Pimozide Hives and Itching    Current Outpatient Medications  Medication Sig Dispense Refill  . acetaminophen (TYLENOL) 500 MG tablet Take 500 mg by mouth daily as needed for headache.     . ALPRAZolam (XANAX) 0.25 MG tablet Take 0.25 mg by mouth 2 (two) times daily as needed.  0  . amiodarone (PACERONE) 200 MG tablet Take 1 tablet (200 mg total) by mouth daily. 90 tablet 3  . cefdinir (OMNICEF) 300 MG capsule Take 1 capsule (300 mg total) by mouth every 12 (twelve) hours. 10 capsule 0  . DULoxetine (CYMBALTA) 60 MG capsule Take 60 mg by mouth at bedtime.     [name] Kitchen ELIQUIS 5 MG TABS tablet TAKE ONE TABLET BY MOUTH TWICE DAILY. 180 tablet 0  . fluticasone (FLONASE) 50 MCG/ACT nasal spray Place 2 sprays into both nostrils 2 (two) times daily as needed for allergies.     . furosemide (LASIX) 20 MG  tablet Take 20 mg by mouth daily as needed for fluid or edema.    . furosemide (LASIX) 40 MG tablet Take 40 mg by mouth daily.  3  . ipratropium-albuterol (DUONEB) 0.5-2.5 (3) MG/3ML SOLN Take 3 mLs by nebulization every 6 (six) hours. 360 mL 0  . isosorbide mononitrate (IMDUR) 30 MG 24 hr tablet Take 1 tablet (30 mg total) by mouth daily. 30 tablet 1  . Magnesium Oxide 400 (240 Mg) MG TABS TAKE ONE TABLET BY MOUTH DAILY. 90 tablet 3  . methocarbamol  (ROBAXIN) 500 MG tablet Take 500 mg by mouth every 6 (six) hours as needed for muscle spasms.     . metoprolol succinate (TOPROL-XL) 50 MG 24 hr tablet Take 1 tablet (50 mg total) by mouth daily. Take with or immediately following a meal. 90 tablet 3  . Multiple Vitamin (MULTIVITAMIN WITH MINERALS) TABS tablet Take 1 tablet by mouth every other day. Centrum     . nitroGLYCERIN (NITROSTAT) 0.4 MG SL tablet Place 1 tablet (0.4 mg total) under the tongue every 5 (five) minutes as needed for chest pain. Reported on 08/04/2015 25 tablet 6  . pantoprazole (PROTONIX) 40 MG tablet Take 40 mg by mouth daily.     . potassium chloride (K-DUR,KLOR-CON) 10 MEQ tablet TAKE TWO (2) TABLETS BY MOUTH DAILY. 180 tablet 0  . allopurinol (ZYLOPRIM) 100 MG tablet Take 1 tablet (100 mg total) daily by mouth. 30 tablet 0  . lisinopril (PRINIVIL,ZESTRIL) 2.5 MG tablet Take 1 tablet (2.5 mg total) by mouth daily. 90 tablet 3   No current facility-administered medications for this visit.     Past Medical History:  Diagnosis Date  . Anxiety   . Arthritis   . Atrial fibrillation ([place])   . Bursitis    Left shoulder  . Cataract   . CHF (congestive heart failure) ([place])   . COPD (chronic obstructive pulmonary disease) ([place])   . Coronary atherosclerosis of native coronary artery    a. s/p DES to LCx in 04/2013 b. cath in 11/2015 showing patent stent with 20% prox-LAD and 80% ostial RCA stenosis for which medical management was recommended due to small artery size  . Depression   . Diastolic heart failure (H[facility])    EF 55-60%  . Dysphagia, unspecified(787.20)   . Dyspnea   . Dysrhythmia   . Essential hypertension   . GERD (gastroesophageal reflux disease)    Hx Schatzki's ring, multiple EGD/ED last 01/06/2004  . Gout   . Headache   . History of anemia   . Hyperlipidemia   . Internal hemorrhoids without mention of complication   . MI (myocardial infarction) ([place]) 2006  . Microscopic colitis 2003  . Panic disorder  without agoraphobia   . Paresthesia   . Pneumonia 12/2011  . PVD (peripheral vascular disease) ([place])   . S/P colonoscopy 09/27/2001   internal hemorrhoids, desc colon inflam polyp, SB BX-chronic duodenitis, colitis  . Thyroid disease     Past Surgical History:  Procedure Laterality Date  . ABDOMINAL HYSTERECTOMY    . ABDOMINAL HYSTERECTOMY    . ANTERIOR AND POSTERIOR REPAIR     with resection of vagina  . ANTERIOR LAT LUMBAR FUSION N/A 08/01/2016   Procedure: Lumbar Two-Lumbar Five Transpsoas lateral interbody fusion with Lumbar Two-Three lateral plate fixation;  Surgeon: Kevan Ny Ditty, MD;  Location: [place];  Service: Neurosurgery;  Laterality: N/A;  L2-5 Transpsoas lateral interbody fusion with L2-3 lateral plate fixation  . APPENDECTOMY    .  BACK SURGERY    . BIOPSY  07/05/2015   Procedure: BIOPSY;  Surgeon: Daneil Dolin, MD;  [facility];  Service: Endoscopy;;  gastric polyp biopsy, ascending colon biopsy  . BLADDER SUSPENSION  11/09/2011   Procedure: TRANSVAGINAL TAPE (TVT) PROCEDURE;  Surgeon: Marissa Nestle, MD;  Location: [facility];  Service: Urology;  Laterality: N/A;  . bladder tack  06/2010  . BREAST LUMPECTOMY  1998   left, benign  . CARDIAC CATHETERIZATION    . CARDIAC CATHETERIZATION    . CARDIAC CATHETERIZATION N/A 12/16/2015   Procedure: Left Heart Cath and Coronary Angiography;  Surgeon: Troy Sine, MD;  Location: [place] [facility];  Service: Cardiovascular;  Laterality: N/A;  . CARDIOVERSION N/A 10/04/2017   Procedure: CARDIOVERSION;  Surgeon: Herminio Commons, MD;  Location: [facility];  Service: Cardiovascular;  Laterality: N/A;  . CARDIOVERSION N/A 01/30/2018   Procedure: CARDIOVERSION;  Surgeon: Herminio Commons, MD;  [facility];  Service: Cardiovascular;  Laterality: N/A;  . [place]   left  . cataract surgery    . CHOLECYSTECTOMY  1998  . Cholecystectomy    . COLONOSCOPY  03/16/2011   multiple  hyperplastic colon polyps, sigmoid diverticulosis, melanosis coli  . COLONOSCOPY WITH PROPOFOL N/A 07/05/2015   RMR:one 5 mm polyp in descending colon  . CORONARY ANGIOPLASTY WITH STENT PLACEMENT    . ESOPHAGEAL DILATION N/A 07/05/2015   Procedure: ESOPHAGEAL DILATION;  Surgeon: Daneil Dolin, MD;  [facility];  Service: Endoscopy;  Laterality: N/A;  . ESOPHAGOGASTRODUODENOSCOPY (EGD) WITH PROPOFOL N/A 07/05/2015   PTW:SFKCLE  . JOINT REPLACEMENT Right 2007   right knee  . left hand surgery    . LEFT HEART CATHETERIZATION WITH CORONARY ANGIOGRAM N/A 05/14/2013   Procedure: LEFT HEART CATHETERIZATION WITH CORONARY ANGIOGRAM;  Surgeon: Blane Ohara, MD;  Location[facility];  Service: Cardiovascular;  Laterality: N/A;  . left rotator cuff surgery    . LUMBAR LAMINECTOMY/DECOMPRESSION MICRODISCECTOMY N/A 10/11/2012   Procedure: LUMBAR LAMINECTOMY/DECOMPRESSION MICRODISCECTOMY 2 LEVELS;  Surgeon: Floyce Stakes, MD;  Location: [place] [facility]UR[facility] ORS;  Service: Neurosurgery;  Laterality: N/A;  L3-4 L4-5 Laminectomy  . LUMBAR WOUND DEBRIDEMENT N/A 09/27/2015   Procedure: Exploration of Lumbar Wound w/ Repair CSF Leak/Lumbar Drain Placement;  Surgeon: Leeroy Cha, MD;  Location: [place] NEURO ORS;  Service: Neurosurgery;  Laterality: N/A;  . MALONEY DILATION  03/16/2011   Gastritis. No H.pylori on bx. 90F maloney dilation with disruption of  occult cevical esophageal web  . NASAL SINUS SURGERY    . right knee replacement  2007  . right leg benign tumor    . SHOULDER SURGERY Left   . TEE WITHOUT CARDIOVERSION N/A 10/04/2017   Procedure: TRANSESOPHAGEAL ECHOCARDIOGRAM (TEE) WITH PROPOFOL;  Surgeon: Herminio Commons, MD;  Location: [facility];  Service: Cardiovascular;  Laterality: N/A;  . TONSILLECTOMY    . unspecified area, hysterectomy  1972   partial    Social History   Socioeconomic History  . Marital status: Divorced    Spouse name: Not on file  . Number of children: 5  . Years of  education: Not on file  . Highest education level: Not on file  Occupational History  . Occupation: retired  Scientific laboratory technician  . Financial resource strain: Not on file  . Food insecurity:    Worry: Not on file    Inability: Not on file  . Transportation needs:  Medical: Not on file    Non-medical: Not on file  Tobacco Use  . Smoking status: Former Smoker    Packs/day: 1.00    Years: 64.00    Pack years: 64.00    Types: Cigarettes    Start date: 12/24/1947    Last attempt to quit: 11/17/2001    Years since quitting: 16.2  . Smokeless tobacco: Never Used  . Tobacco comment: Quit smoking in 2003  Substance and Sexual Activity  . Alcohol use: No    Alcohol/week: 0.0 standard drinks  . Drug use: No  . Sexual activity: Never  Lifestyle  . Physical activity:    Days per week: Not on file    Minutes per session: Not on file  . Stress: Not on file  Relationships  . Social connections:    Talks on phone: Not on file    Gets together: Not on file    Attends religious service: Not on file    Active member of club or organization: Not on file    Attends meetings of clubs or organizations: Not on file    Relationship status: Not on file  . Intimate partner violence:    Fear of current or ex partner: Not on file    Emotionally abused: Not on file    Physically abused: Not on file    Forced sexual activity: Not on file  Other Topics Concern  . Not on file  Social History Narrative   Divorced.   Sister had colon perforation & died from complications in [place], [place]     Vitals:   02/22/18 1038  BP: (!) 123/59  Pulse: 73  SpO2: 99%  Weight: 162 lb (73.5 kg)  Height: 5\' 1"  (1.549 m)    Wt Readings from Last 3 Encounters:  02/22/18 162 lb (73.5 kg)  01/28/18 161 lb (73 kg)  01/16/18 160 lb (72.6 kg)     PHYSICAL EXAM General: NAD HEENT: Normal. Neck: No JVD, no thyromegaly. Lungs: Clear to auscultation bilaterally with normal respiratory effort. CV: Regular rate and  rhythm, normal S1/S2, no S3/S4, no murmur. No pretibial or periankle edema.     Abdomen: Soft, nontender, no distention.  Neurologic: Alert and oriented.  Psych: Normal affect. Skin: Normal. Musculoskeletal: No gross deformities.    ECG: Reviewed above under Subjective   Labs: Lab Results  Component Value Date/Time   K 4.8 01/28/2018 03:00 PM   BUN 23 01/28/2018 03:00 PM   CREATININE 1.53 (H) 01/28/2018 03:00 PM   CREATININE 1.42 (H) 11/16/2017 12:05 PM   ALT 23 12/30/2017 11:34 AM   TSH 5.881 (H) 02/19/2017 07:36 PM   TSH 2.770 11/25/2013 02:14 PM   HGB 10.8 (L) 01/28/2018 03:00 PM     Lipids: Lab Results  Component Value Date/Time   LDLCALC 103 (H) 12/16/2015 04:35 AM   CHOL 177 12/16/2015 04:35 AM   TRIG 197 (H) 12/16/2015 04:35 AM   HDL 35 (L) 12/16/2015 04:35 AM       ASSESSMENT AND PLAN:  1. CAD with DES to LCx:She has some exertional dyspnea but does not appear to be in decompensated heart failure.  She has a long history of tobacco abuse but quit smoking in 2003 and no documented COPD as of May 2016. Nuclear stress test was normal in March 2019 as noted above.Last coronary angiogram details also described above. She is currently takingToprol-XL 50mg  daily.I willincrease Imdur to 60 mg daily. Statin intolerant.  Not on aspirin asshe is on  Eliquis for atrial fibrillation.  2. Essential HTN: Controlled.I will monitor given increase of Imdur.  3.Chroniccombinedsystolicand diastolicheart failure, EF 40-45% (previously 55%):Weight is stable and she is euvolemic.  Currently on Lasix 40 mg daily. Cardiomyopathy is possibly tachycardia mediated.  Continue Toprol-XL and low-dose lisinopril.   4. Hyperlipidemia: Statin intolerant.  5. Aortic regurgitation: Mild at present.  6.Long-standing persistentatrial fibrillation: She is currently in a regular rhythm and denies palpitations.  I performed direct-current cardioversion for persistent atrial  fibrillation on 01/30/2018 and reduced Toprol-XL to 50 mg daily and has been maintained on amiodarone 200 mg daily.  I previously performed TEE and successful cardioversion on 10/04/2017. She will need routine monitoring of TSH and LFTs.  I will check both now. She is anticoagulated with Eliquis.  She has moderate to severe left atrial dilatation and is at high risk for recurrence.  Given her chronic kidney disease, I would not add digoxin.  7.  Chronic kidney disease stage III: BUN 23 and creatinine 1.53 on 01/28/2018.  I will check a basic metabolic panel within the next few days given increased diuretic requirement.   Disposition: Follow up with Dr. Lovena Le as scheduled in January 2020.  Follow-up with me in August 2020.   Kate Sable, M.D., F.A.C.C.

## 2018-02-22 NOTE — Telephone Encounter (Signed)
Pt given lab results, mailed her bmet slip for 2 months ( early January)

## 2018-03-14 ENCOUNTER — Emergency Department (HOSPITAL_COMMUNITY): Payer: Medicare Other

## 2018-03-14 ENCOUNTER — Other Ambulatory Visit: Payer: Self-pay

## 2018-03-14 ENCOUNTER — Emergency Department (HOSPITAL_COMMUNITY)
Admission: EM | Admit: 2018-03-14 | Discharge: 2018-03-14 | Disposition: A | Discharge status: Home / Self Care | Payer: Medicare Other | Attending: Emergency Medicine | Admitting: Emergency Medicine

## 2018-03-14 ENCOUNTER — Encounter (HOSPITAL_COMMUNITY): Payer: Self-pay | Admitting: Emergency Medicine

## 2018-03-14 DIAGNOSIS — J449 Chronic obstructive pulmonary disease, unspecified: Secondary | ICD-10-CM | POA: Diagnosis not present

## 2018-03-14 DIAGNOSIS — I2510 Atherosclerotic heart disease of native coronary artery without angina pectoris: Secondary | ICD-10-CM | POA: Diagnosis not present

## 2018-03-14 DIAGNOSIS — R51 Headache: Secondary | ICD-10-CM | POA: Diagnosis not present

## 2018-03-14 DIAGNOSIS — Z79899 Other long term (current) drug therapy: Secondary | ICD-10-CM | POA: Insufficient documentation

## 2018-03-14 DIAGNOSIS — N183 Chronic kidney disease, stage 3 (moderate): Secondary | ICD-10-CM | POA: Diagnosis not present

## 2018-03-14 DIAGNOSIS — R040 Epistaxis: Secondary | ICD-10-CM

## 2018-03-14 DIAGNOSIS — R079 Chest pain, unspecified: Secondary | ICD-10-CM

## 2018-03-14 DIAGNOSIS — R0602 Shortness of breath: Secondary | ICD-10-CM | POA: Diagnosis not present

## 2018-03-14 DIAGNOSIS — I130 Hypertensive heart and chronic kidney disease with heart failure and stage 1 through stage 4 chronic kidney disease, or unspecified chronic kidney disease: Secondary | ICD-10-CM | POA: Insufficient documentation

## 2018-03-14 DIAGNOSIS — Z7901 Long term (current) use of anticoagulants: Secondary | ICD-10-CM | POA: Diagnosis not present

## 2018-03-14 DIAGNOSIS — I5042 Chronic combined systolic (congestive) and diastolic (congestive) heart failure: Secondary | ICD-10-CM | POA: Diagnosis not present

## 2018-03-14 DIAGNOSIS — R002 Palpitations: Secondary | ICD-10-CM | POA: Diagnosis not present

## 2018-03-14 DIAGNOSIS — Z87891 Personal history of nicotine dependence: Secondary | ICD-10-CM | POA: Diagnosis not present

## 2018-03-14 LAB — CBC
HEMATOCRIT: 36.9 % (ref 36.0–46.0)
Hemoglobin: 11.5 g/dL — ABNORMAL LOW (ref 12.0–15.0)
MCH: 27.7 pg (ref 26.0–34.0)
MCHC: 31.2 g/dL (ref 30.0–36.0)
MCV: 88.9 fL (ref 80.0–100.0)
Platelets: 404 10*3/uL — ABNORMAL HIGH (ref 150–400)
RBC: 4.15 MIL/uL (ref 3.87–5.11)
RDW: 14.6 % (ref 11.5–15.5)
WBC: 9.5 10*3/uL (ref 4.0–10.5)
nRBC: 0 % (ref 0.0–0.2)

## 2018-03-14 LAB — URINALYSIS, ROUTINE W REFLEX MICROSCOPIC
Bacteria, UA: NONE SEEN
Bilirubin Urine: NEGATIVE
Glucose, UA: NEGATIVE mg/dL
Ketones, ur: NEGATIVE mg/dL
Leukocytes, UA: NEGATIVE
Nitrite: NEGATIVE
PROTEIN: NEGATIVE mg/dL
Specific Gravity, Urine: 1.008 (ref 1.005–1.030)
pH: 5 (ref 5.0–8.0)

## 2018-03-14 LAB — COMPREHENSIVE METABOLIC PANEL
ALT: 27 U/L (ref 0–44)
AST: 32 U/L (ref 15–41)
Albumin: 4.1 g/dL (ref 3.5–5.0)
Alkaline Phosphatase: 106 U/L (ref 38–126)
Anion gap: 11 (ref 5–15)
BUN: 26 mg/dL — AB (ref 8–23)
CO2: 24 mmol/L (ref 22–32)
Calcium: 9.1 mg/dL (ref 8.9–10.3)
Chloride: 100 mmol/L (ref 98–111)
Creatinine, Ser: 1.58 mg/dL — ABNORMAL HIGH (ref 0.44–1.00)
GFR calc Af Amer: 35 mL/min — ABNORMAL LOW (ref >60)
GFR calc non Af Amer: 30 mL/min — ABNORMAL LOW (ref >60)
GLUCOSE: 98 mg/dL (ref 70–99)
Potassium: 4 mmol/L (ref 3.5–5.1)
Sodium: 135 mmol/L (ref 135–145)
Total Bilirubin: 0.7 mg/dL (ref 0.3–1.2)
Total Protein: 8 g/dL (ref 6.5–8.1)

## 2018-03-14 LAB — TROPONIN I
Troponin I: <0.03 ng/mL (ref <0.03)
Troponin I: <0.03 ng/mL (ref <0.03)

## 2018-03-14 MED ORDER — SILVER NITRATE-POT NITRATE 75-25 % EX MISC
CUTANEOUS | Status: AC
  Filled 2018-03-14: qty 1

## 2018-03-14 MED ORDER — ACETAMINOPHEN 325 MG PO TABS
650.0000 mg | ORAL_TABLET | Freq: Once | ORAL | Status: AC
  Administered 2018-03-14: 650 mg via ORAL
  Filled 2018-03-14: qty 2

## 2018-03-14 NOTE — ED Triage Notes (Signed)
Patient reports epistaxis that started yesterday, on Eliquis. Reports chest pain intermittently for 3 weeks. Had cardioversion 2 weeks ago. Patient states she has periods of tachycardia and irregular heartbeat.

## 2018-03-14 NOTE — ED Notes (Signed)
Silver nitrate applicator/nose tray at bedside per Dr Sabra Heck request

## 2018-03-14 NOTE — Discharge Instructions (Signed)
To fix the bleeding from your nose we had to use a medication called silver nitrate which cauterized the surface of the skin.  This has prevented it from bleeding anymore however these things do occasionally bleed after you go home.  If you do develop bleeding, please take Afrin and scored 1 squirt into your nose, hold pressure for 10 minutes, if the bleeding is continuing return to the hospital. Your CT scan showed no signs of tumors or bleeding in your brain and your blood work was unremarkable showing that you are not having a heart attack. Please follow-up with your family doctor within the next 2 or 3 days if you are still having symptoms but return to the emergency department for severe or worsening chest pain, difficulty breathing, palpitations, headache or any other worsening symptoms

## 2018-03-14 NOTE — ED Notes (Signed)
Lab at bedside for repeat troponin draw

## 2018-03-14 NOTE — ED Provider Notes (Signed)
[facility] EMERGENCY DEPARTMENT Provider Note   CSN: [id number] Arrival date & time: 03/14/18  1548     History   Chief Complaint Chief Complaint  Patient presents with  . Chest Pain    HPI Tonya SCHIMPF is a 84 y.o. female.  HPI  Very pleasant 84 year old female, known history of atrial fibrillation as well as a history of COPD, coronary disease status post stenting in 2015 and a cath in 2017 showing patent stent, there was medical management recommended due to a ostial 80% lesion at the mouth of the right coronary artery.  She has a known decreased ejection fraction of 55 to 85% and diastolic heart failure and is on Eliquis because of atrial fibrillation.  She was most recently cardioverted for atrial fibrillation after having an echocardiogram showing no thrombus, this was successful and the patient has been on Eliquis since.  She presents today with multiple complaints.  1.  She reports that she has had a nosebleed the majority of the day which is a very slow trickle, seems to be coming from the right nostril according to the patient, she is unsure.  It has been causing her to blow her nose frequently and she does note that she has been having lots of sneezing over the last couple of days with blowing her nose as well.  2.  She reports having a headache which starts at her bilateral eyes and goes up over her head to the back of the head.  This is persistent, moderate and associated with lightheadedness and some intermittent blurred vision.  She states her vision is normal at this time, she has a headache at this time.  It is unusual for her to have headaches.  Associated with this is a feeling of fatigue like she cannot get anything done, she is supposed to be cooking and cleaning and cannot do any of that feeling like she is just not having enough energy.  She does not have focal weakness  3.  Chest discomfort.  For the last 3 weeks ever since the cardioversion she has had  intermittent feeling of palpitations associated with a sharp stabbing chest pain, shortness of breath, this comes on and resolved spontaneously without intervention or provocation.  The patient has been compliant with her medications, she denies any vomiting diarrhea fevers abdominal discomfort dysuria swelling or rashes.  Past Medical History:  Diagnosis Date  . Anxiety   . Arthritis   . Atrial fibrillation ([place])   . Bursitis    Left shoulder  . Cataract   . CHF (congestive heart failure) ([place])   . COPD (chronic obstructive pulmonary disease) ([place])   . Coronary atherosclerosis of native coronary artery    a. s/p DES to LCx in 04/2013 b. cath in 11/2015 showing patent stent with 20% prox-LAD and 80% ostial RCA stenosis for which medical management was recommended due to small artery size  . Depression   . Diastolic heart failure (HCC)    EF 55-60%  . Dysphagia, unspecified(787.20)   . Dyspnea   . Dysrhythmia   . Essential hypertension   . GERD (gastroesophageal reflux disease)    Hx Schatzki's ring, multiple EGD/ED last 01/06/2004  . Gout   . Headache   . History of anemia   . Hyperlipidemia   . Internal hemorrhoids without mention of complication   . MI (myocardial infarction) ([place]) 2006  . Microscopic colitis 2003  . Panic disorder without agoraphobia   . Paresthesia   .  Pneumonia 12/2011  . PVD (peripheral vascular disease) ([place])   . S/P colonoscopy 09/27/2001   internal hemorrhoids, desc colon inflam polyp, SB BX-chronic duodenitis, colitis  . Thyroid disease     Patient Active Problem List   Diagnosis Date Noted  . Persistent atrial fibrillation 12/30/2017  . Chronic combined systolic and diastolic congestive heart failure ([place]) 12/30/2017  . Hyperammonemia ([place]) 02/20/2017  . Myoclonic jerking 02/20/2017  . Atrial fibrillation ([place]) 02/19/2017  . Acute gout 02/17/2017  . Chronic systolic heart failure ([place])   . Chest pain 09/30/2016  . Status post lumbar spine  surgery for decompression of spinal cord 08/07/2016  . Lumbosacral spondylosis with radiculopathy 08/01/2016  . Preoperative clearance 07/05/2016  . Cough 05/26/2016  . Weakness 04/26/2016  . Nausea without vomiting 04/26/2016  . Atrial fibrillation with RVR ([place]) 04/15/2016  . DOE (dyspnea on exertion) 03/20/2016  . Hypoxia   . Acute on chronic diastolic congestive heart failure ([place])   . Chronic kidney disease, stage III (moderate) ([place]) 02/17/2016  . Essential hypertension 02/17/2016  . COPD exacerbation ([place]) 02/17/2016  . Acute bronchitis 12/18/2015  . CAD in native artery   . Chest pain at rest 10/15/2015  . Chronic low back pain 10/15/2015  . Hyponatremia 10/15/2015  . Hyperglycemia 10/15/2015  . Thrombocytosis ([place]) 10/15/2015  . Atypical chest pain 10/15/2015  . Depression   . Anxiety   . Gastroesophageal reflux disease without esophagitis   . Mild cognitive impairment 10/14/2015  . Iron deficiency anemia due to chronic blood loss   . Meningitis 10/01/2015  . Coronary artery disease due to lipid rich plaque   . NSVT (nonsustained ventricular tachycardia) ([place])   . PAF (paroxysmal atrial fibrillation) ([place])   . CSF leak 09/27/2015  . Spondylolisthesis of lumbar region 09/24/2015  . Gastric polyp   . History of colonic polyps   . Chronic diarrhea   . Esophageal dysphagia 04/01/2015  . PAOD (peripheral arterial occlusive disease) ([place]) 03/05/2015  . Pain in the chest   . Hyperlipidemia   . Weight gain 08/12/2014  . Anemia 08/07/2014  . Hemorrhoids 08/06/2014  . CHF (congestive heart failure) ([place]) 07/29/2014  . Rectal bleeding 07/06/2014  . Constipation 07/06/2014  . Lower extremity edema 11/24/2013  . Acute kidney failure ([place]) 11/24/2013  . Hypokalemia 11/24/2013  . Other and unspecified angina pectoris 05/14/2013  . Hematochezia 02/13/2011  . Abdominal pain 02/13/2011  . Major depression ([place]) 09/28/2010  . FATIGUE 04/13/2009  . Chronic diastolic heart  failure ([place]) 11/30/2008  . DYSPNEA 11/30/2008  . CAD, NATIVE VESSEL - PCI + DES to left circumflex 05/14/13 11/27/2008  . Peripheral vascular disease ([place]) 11/27/2008  . PANIC ATTACK 02/28/2008  . MI 02/28/2008  . Internal hemorrhoids 02/28/2008  . COLITIS 02/28/2008  . Dysphagia 02/28/2008    Past Surgical History:  Procedure Laterality Date  . ABDOMINAL HYSTERECTOMY    . ABDOMINAL HYSTERECTOMY    . ANTERIOR AND POSTERIOR REPAIR     with resection of vagina  . ANTERIOR LAT LUMBAR FUSION N/A 08/01/2016   Procedure: Lumbar Two-Lumbar Five Transpsoas lateral interbody fusion with Lumbar Two-Three lateral plate fixation;  Surgeon: Kevan Ny Ditty, MD;  Location: [facility];  Service: Neurosurgery;  Laterality: N/A;  L2-5 Transpsoas lateral interbody fusion with L2-3 lateral plate fixation  . APPENDECTOMY    . BACK SURGERY    . BIOPSY  07/05/2015   Procedure: BIOPSY;  Surgeon: Daneil Dolin, MD;  [facility];  Service:  Endoscopy;;  gastric polyp biopsy, ascending colon biopsy  . BLADDER SUSPENSION  11/09/2011   Procedure: TRANSVAGINAL TAPE (TVT) PROCEDURE;  Surgeon: Marissa Nestle, MD;  Location: [facility] OR[facility];  Service: Urology;  Laterality: N/A;  . bladder tack  06/2010  . BREAST LUMPECTOMY  1998   left, benign  . CARDIAC CATHETERIZATION    . CARDIAC CATHETERIZATION    . CARDIAC CATHETERIZATION N/A 12/16/2015   Procedure: Left Heart Cath and Coronary Angiography;  Surgeon: Troy Sine, MD;  Location: [facility] CV LAB;  Service: Cardiovascular;  Laterality: N/A;  . CARDIOVERSION N/A 10/04/2017   Procedure: CARDIOVERSION;  Surgeon: Herminio Commons, MD;  Location: [facility];  Service: Cardiovascular;  Laterality: N/A;  . CARDIOVERSION N/A 01/30/2018   Procedure: CARDIOVERSION;  Surgeon: Herminio Commons, MD;  [facility];  Service: Cardiovascular;  Laterality: N/A;  . [place]   left  . cataract surgery    . CHOLECYSTECTOMY  1998  .  Cholecystectomy    . COLONOSCOPY  03/16/2011   multiple hyperplastic colon polyps, sigmoid diverticulosis, melanosis coli  . COLONOSCOPY WITH PROPOFOL N/A 07/05/2015   RMR:one 5 mm polyp in descending colon  . CORONARY ANGIOPLASTY WITH STENT PLACEMENT    . ESOPHAGEAL DILATION N/A 07/05/2015   Procedure: ESOPHAGEAL DILATION;  Surgeon: Daneil Dolin, MD;  [facility];  Service: Endoscopy;  Laterality: N/A;  . ESOPHAGOGASTRODUODENOSCOPY (EGD) WITH PROPOFOL N/A 07/05/2015   KCL:EXNTZG  . JOINT REPLACEMENT Right 2007   right knee  . left hand surgery    . LEFT HEART CATHETERIZATION WITH CORONARY ANGIOGRAM N/A 05/14/2013   Procedure: LEFT HEART CATHETERIZATION WITH CORONARY ANGIOGRAM;  Surgeon: Blane Ohara, MD;  Location: [facility] CATH LAB;  Service: Cardiovascular;  Laterality: N/A;  . left rotator cuff surgery    . LUMBAR LAMINECTOMY/DECOMPRESSION MICRODISCECTOMY N/A 10/11/2012   Procedure: LUMBAR LAMINECTOMY/DECOMPRESSION MICRODISCECTOMY 2 LEVELS;  Surgeon: Floyce Stakes, MD;  Location: [place] NEURO ORS;  Service: Neurosurgery;  Laterality: N/A;  L3-4 L4-5 Laminectomy  . LUMBAR WOUND DEBRIDEMENT N/A 09/27/2015   Procedure: Exploration of Lumbar Wound w/ Repair CSF Leak/Lumbar Drain Placement;  Surgeon: Leeroy Cha, MD;  Location: [place] NEURO ORS;  Service: Neurosurgery;  Laterality: N/A;  . MALONEY DILATION  03/16/2011   Gastritis. No H.pylori on bx. 22F maloney dilation with disruption of  occult cevical esophageal web  . NASAL SINUS SURGERY    . right knee replacement  2007  . right leg benign tumor    . SHOULDER SURGERY Left   . TEE WITHOUT CARDIOVERSION N/A 10/04/2017   Procedure: TRANSESOPHAGEAL ECHOCARDIOGRAM (TEE) WITH PROPOFOL;  Surgeon: Herminio Commons, MD;  Location: [facility];  Service: Cardiovascular;  Laterality: N/A;  . TONSILLECTOMY    . unspecified area, hysterectomy  1972   partial     OB History    Gravida  8   Para  6   Term      Preterm      AB  2    Living  5     SAB      TAB      Ectopic      Multiple      Live Births               Home Medications    Prior to Admission medications   Medication Sig Start Date End Date Taking? Authorizing Provider  acetaminophen (TYLENOL) 500 MG tablet Take 1,000 mg by  mouth daily as needed for headache.    Yes [provider]  amiodarone (PACERONE) 200 MG tablet Take 1 tablet (200 mg total) by mouth daily. 12/10/17  Yes Evans Lance, MD  DULoxetine (CYMBALTA) 60 MG capsule Take 60 mg by mouth at bedtime.    Yes [provider]  ELIQUIS 5 MG TABS tablet TAKE ONE TABLET BY MOUTH TWICE DAILY. 01/28/18  Yes Herminio Commons, MD  fluticasone (FLONASE) 50 MCG/ACT nasal spray Place 2 sprays into both nostrils 2 (two) times daily as needed for allergies.    Yes [provider]  furosemide (LASIX) 40 MG tablet Take 40 mg by mouth daily. 12/03/17  Yes [provider]  ipratropium-albuterol (DUONEB) 0.5-2.5 (3) MG/3ML SOLN Take 3 mLs by nebulization every 6 (six) hours. 12/18/15  Yes Regalado, Belkys A, MD  isosorbide mononitrate (IMDUR) 60 MG 24 hr tablet Take 1 tablet (60 mg total) by mouth daily. 02/22/18  Yes Herminio Commons, MD  lisinopril (PRINIVIL,ZESTRIL) 2.5 MG tablet Take 1 tablet (2.5 mg total) by mouth daily. 09/18/17 03/14/18 Yes Herminio Commons, MD  Magnesium Oxide 400 (240 Mg) MG TABS TAKE ONE TABLET BY MOUTH DAILY. 02/22/17  Yes Herminio Commons, MD  methocarbamol (ROBAXIN) 500 MG tablet Take 500 mg by mouth every 6 (six) hours as needed for muscle spasms.    Yes [provider]  metoprolol succinate (TOPROL-XL) 50 MG 24 hr tablet Take 1 tablet (50 mg total) by mouth daily. Take with or immediately following a meal. 01/30/18 04/30/18 Yes Herminio Commons, MD  Multiple Vitamin (MULTIVITAMIN WITH MINERALS) TABS tablet Take 1 tablet by mouth every other day. Centrum    Yes [provider]  pantoprazole (PROTONIX) 40 MG  tablet Take 40 mg by mouth daily.    Yes [provider]  potassium chloride (K-DUR,KLOR-CON) 10 MEQ tablet TAKE TWO (2) TABLETS BY MOUTH DAILY. 10/26/17  Yes Herminio Commons, MD  ALPRAZolam Duanne Moron) 0.25 MG tablet Take 0.25 mg by mouth 2 (two) times daily as needed. 09/04/16   [provider]  furosemide (LASIX) 20 MG tablet Take 20 mg by mouth daily as needed for fluid or edema.    [provider]  nitroGLYCERIN (NITROSTAT) 0.4 MG SL tablet Place 1 tablet (0.4 mg total) under the tongue every 5 (five) minutes as needed for chest pain. Reported on 08/04/2015 11/20/17   Herminio Commons, MD    Family History Family History  Problem Relation Age of Onset  . Stroke Mother   . Parkinson's disease Father   . Coronary artery disease Other        family Hx-sons  . Cancer Other   . Stroke Other        family Hx  . Hypertension Other        family Hx  . Diabetes Brother   . Heart disease Son        before age 37  . Diabetes Son   . Stroke Daughter 63  . Colon cancer Neg Hx     Social History Social History   Tobacco Use  . Smoking status: Former Smoker    Packs/day: 1.00    Years: 64.00    Pack years: 64.00    Types: Cigarettes    Start date: 12/24/1947    Last attempt to quit: 11/17/2001    Years since quitting: 16.3  . Smokeless tobacco: Never Used  . Tobacco comment: Quit smoking in 2003  Substance  Use Topics  . Alcohol use: No    Alcohol/week: 0.0 standard drinks  . Drug use: No     Allergies   Cephalosporins; Levaquin [levofloxacin in d5w]; Macrodantin [nitrofurantoin macrocrystal]; Phenothiazines; Polysorbate; Prednisone; Buspirone; Cardura [doxazosin mesylate]; Codeine; Acyclovir and related; Prochlorperazine; Ranexa [ranolazine]; Atorvastatin; Ofloxacin; Other; Penicillins; and Pimozide   Review of Systems Review of Systems  Constitutional: Positive for fatigue. Negative for chills and fever.  HENT: Positive for congestion, nosebleeds  and sneezing.   Eyes: Positive for visual disturbance.  Respiratory: Positive for shortness of breath. Negative for cough.   Cardiovascular: Positive for chest pain and palpitations. Negative for leg swelling.  Gastrointestinal: Negative for diarrhea, nausea and vomiting.  Genitourinary: Negative for dysuria.  Musculoskeletal: Negative for back pain and myalgias.  Neurological: Positive for light-headedness and headaches. Negative for syncope, speech difficulty and numbness.  Hematological: Bruises/bleeds easily.  All other systems reviewed and are negative.    Physical Exam Updated Vital Signs BP 132/68   Pulse 67   Temp 98.4 F (36.9 C) (Oral)   Resp 20   Ht 1.549 m (5\' 1" )   Wt 73.5 kg   SpO2 98%   BMI 30.61 kg/m   Physical Exam  Constitutional: She appears well-developed and well-nourished. No distress.  HENT:  Head: Normocephalic and atraumatic.  Mouth/Throat: Oropharynx is clear and moist. No oropharyngeal exudate.  The oropharynx is clear and moist, there is no blood in the posterior pharynx.  Nasal passages are clear bilaterally however in the left nostril on the nasal septum there does appear to be a small area where there is a scab, there is no active bleeding on either side.  The nares are completely patent.  Eyes: Pupils are equal, round, and reactive to light. Conjunctivae and EOM are normal. Right eye exhibits no discharge. Left eye exhibits no discharge. No scleral icterus.  Neck: Normal range of motion. Neck supple. No JVD present. No thyromegaly present.  Cardiovascular: Normal rate, regular rhythm, normal heart sounds and intact distal pulses. Exam reveals no gallop and no friction rub.  No murmur heard. Pulmonary/Chest: Effort normal and breath sounds normal. No respiratory distress. She has no wheezes. She has no rales.  Abdominal: Soft. Bowel sounds are normal. She exhibits no distension and no mass. There is no tenderness.  Musculoskeletal: Normal range of  motion. She exhibits no edema or tenderness.  Lymphadenopathy:    She has no cervical adenopathy.  Neurological: She is alert. Coordination normal.  Normal speech coordination and strength in all 4 extremities.  The patient has no difficulty sitting up to allow for posterior lung auscultation.  Skin: Skin is warm and dry. No rash noted. No erythema.  Psychiatric: She has a normal mood and affect. Her behavior is normal.  Nursing note and vitals reviewed.    ED Treatments / Results  Labs (all labs ordered are listed, but only abnormal results are displayed) Labs Reviewed  CBC - Abnormal; Notable for the following components:      Result Value   Hemoglobin 11.5 (*)    Platelets 404 (*)    All other components within normal limits  COMPREHENSIVE METABOLIC PANEL - Abnormal; Notable for the following components:   BUN 26 (*)    Creatinine, Ser 1.58 (*)    GFR calc non Af Amer 30 (*)    GFR calc Af Amer 35 (*)    All other components within normal limits  URINALYSIS, ROUTINE W REFLEX MICROSCOPIC - Abnormal; Notable for the  following components:   Hgb urine dipstick SMALL (*)    All other components within normal limits  TROPONIN I  TROPONIN I    EKG EKG Interpretation  Date/Time:  Thursday March 14 2018 15:59:22 EST Ventricular Rate:  64 PR Interval:    QRS Duration: 94 QT Interval:  437 QTC Calculation: 451 R Axis:   -20 Text Interpretation:  Sinus rhythm Borderline left axis deviation Low voltage, precordial leads Probable anteroseptal infarct, old Baseline wander in lead(s) V6 since 01/30/18, no changes seen Confirmed by Noemi Chapel [phone]) on 03/14/2018 4:16:48 PM   Radiology Ct Head Wo Contrast  Result Date: 03/14/2018 CLINICAL DATA:  Epistaxis EXAM: CT HEAD WITHOUT CONTRAST TECHNIQUE: Contiguous axial images were obtained from the base of the skull through the vertex without intravenous contrast. COMPARISON:  CT brain 05/07/2016 FINDINGS: Brain: No acute  territorial infarction, hemorrhage or intracranial mass. Minimal small vessel ischemic changes of the white matter. Stable ventricle size. Vascular: No hyperdense vessels.  Carotid vascular calcification Skull: Normal. Negative for fracture or focal lesion. Sinuses/Orbits: Mucosal thickening in the right maxillary and ethmoid sinuses. No acute orbital abnormality. Other: None IMPRESSION: 1. No CT evidence for acute intracranial abnormality. 2. Minimal small vessel ischemic changes of the white matter Electronically Signed   By: Donavan Foil M.D.   On: 03/14/2018 17:53    Procedures .Epistaxis Management Date/Time: 03/14/2018 8:01 PM Performed by: Noemi Chapel, MD Authorized by: Noemi Chapel, MD   Consent:    Consent obtained:  Verbal   Consent given by:  Patient   Risks discussed:  Bleeding, nasal injury and pain   Alternatives discussed:  No treatment, delayed treatment and alternative treatment Anesthesia (see MAR for exact dosages):    Anesthesia method:  None Procedure details:    Treatment site:  R anterior   Treatment method:  Silver nitrate   Treatment complexity:  Limited   Treatment episode: initial   Post-procedure details:    Assessment:  Bleeding stopped   Patient tolerance of procedure:  Tolerated well, no immediate complications Comments:         (including critical care time)  Medications Ordered in ED Medications  silver nitrate applicators 26-94 % applicator (has no administration in time range)  acetaminophen (TYLENOL) tablet 650 mg (650 mg Oral Given 03/14/18 1822)     Initial Impression / Assessment and Plan / ED Course  I have reviewed the triage vital signs and the nursing notes.  Pertinent labs & imaging results that were available during my care of the patient were reviewed by me and considered in my medical decision making (see chart for details).  Clinical Course as of Mar 14 2032  Thu Mar 14, 2018  1803 Creatinine is close to baseline,  troponin is negative and the CBC shows no abnormalities of any concern.   [BM]  1959 MCH: 27.7 [BM]  2027 Second trop negative.   [BM]    Clinical Course User Index [BM] Noemi Chapel, MD    Overall the patient does not appear to have any focal deficits.  She does have fatigue headache epistaxis which is been recurrent this morning and intermittent chest pain shortness of breath.  As I reviewed the medical record it is clear that the patient has had some side effects in the past 2 amiodarone, she has had known cardiac disease which has been treated medically, I have obtained an EKG which shows no signs of acute ischemia or arrhythmia.  At this time I think  it would be reasonable to check a troponin, CT scan of the brain as she has having epistaxis, would rule out intracranial hemorrhage.  Anemia.  Electro light abnormalities.  If she starts to have epistaxis again we can be more aggressive about treating it but at this time there is no active bleeding.  Second troponin ordered, epistaxis has completely stopped after silver nitrate application, the patient reports that her chest pain has been rather persistent for the last 3 weeks, it is still persistent this evening despite having an unremarkable EKG and initial troponin which was negative.  Second troponin is pending.  CT scan was negative for any intracranial findings, headache is improved significantly after Tylenol.  The patient was reexamined after cautery prior to discharge, no findings, no bleeding, cautery was appropriate and helped to stop the bleeding completely.  Second troponin negative, patient informed of all of her results, stable for discharge   Final Clinical Impressions(s) / ED Diagnoses   Final diagnoses:  Anterior epistaxis  Chest pain at rest    ED Discharge Orders    None       Noemi Chapel, MD 03/14/18 2033

## 2018-03-14 NOTE — ED Notes (Signed)
Pt aware of urine sample needed,patiet can't go right now,will check back in 30 minutes.

## 2018-03-16 ENCOUNTER — Other Ambulatory Visit: Payer: Self-pay

## 2018-03-16 ENCOUNTER — Encounter (HOSPITAL_COMMUNITY): Payer: Self-pay | Admitting: Emergency Medicine

## 2018-03-16 ENCOUNTER — Emergency Department (HOSPITAL_COMMUNITY)
Admission: EM | Admit: 2018-03-16 | Discharge: 2018-03-16 | Disposition: A | Discharge status: Home / Self Care | Payer: Medicare Other | Attending: Emergency Medicine | Admitting: Emergency Medicine

## 2018-03-16 DIAGNOSIS — E079 Disorder of thyroid, unspecified: Secondary | ICD-10-CM | POA: Diagnosis not present

## 2018-03-16 DIAGNOSIS — I2510 Atherosclerotic heart disease of native coronary artery without angina pectoris: Secondary | ICD-10-CM | POA: Diagnosis not present

## 2018-03-16 DIAGNOSIS — I130 Hypertensive heart and chronic kidney disease with heart failure and stage 1 through stage 4 chronic kidney disease, or unspecified chronic kidney disease: Secondary | ICD-10-CM | POA: Insufficient documentation

## 2018-03-16 DIAGNOSIS — Z7901 Long term (current) use of anticoagulants: Secondary | ICD-10-CM | POA: Diagnosis not present

## 2018-03-16 DIAGNOSIS — R040 Epistaxis: Secondary | ICD-10-CM | POA: Insufficient documentation

## 2018-03-16 DIAGNOSIS — Z87891 Personal history of nicotine dependence: Secondary | ICD-10-CM | POA: Diagnosis not present

## 2018-03-16 DIAGNOSIS — J449 Chronic obstructive pulmonary disease, unspecified: Secondary | ICD-10-CM | POA: Diagnosis not present

## 2018-03-16 DIAGNOSIS — I5042 Chronic combined systolic (congestive) and diastolic (congestive) heart failure: Secondary | ICD-10-CM | POA: Diagnosis not present

## 2018-03-16 DIAGNOSIS — N183 Chronic kidney disease, stage 3 (moderate): Secondary | ICD-10-CM | POA: Diagnosis not present

## 2018-03-16 DIAGNOSIS — Z79899 Other long term (current) drug therapy: Secondary | ICD-10-CM | POA: Diagnosis not present

## 2018-03-16 LAB — CBC WITH DIFFERENTIAL/PLATELET
Abs Immature Granulocytes: 0.07 10*3/uL (ref 0.00–0.07)
Basophils Absolute: 0.1 10*3/uL (ref 0.0–0.1)
Basophils Relative: 1 %
Eosinophils Absolute: 0.2 10*3/uL (ref 0.0–0.5)
Eosinophils Relative: 2 %
HCT: 36.2 % (ref 36.0–46.0)
Hemoglobin: 11.3 g/dL — ABNORMAL LOW (ref 12.0–15.0)
Immature Granulocytes: 1 %
Lymphocytes Relative: 30 %
Lymphs Abs: 2.3 10*3/uL (ref 0.7–4.0)
MCH: 27.6 pg (ref 26.0–34.0)
MCHC: 31.2 g/dL (ref 30.0–36.0)
MCV: 88.3 fL (ref 80.0–100.0)
Monocytes Absolute: 0.6 10*3/uL (ref 0.1–1.0)
Monocytes Relative: 8 %
Neutro Abs: 4.5 10*3/uL (ref 1.7–7.7)
Neutrophils Relative %: 58 %
Platelets: 380 10*3/uL (ref 150–400)
RBC: 4.1 MIL/uL (ref 3.87–5.11)
RDW: 14.6 % (ref 11.5–15.5)
WBC: 7.8 10*3/uL (ref 4.0–10.5)
nRBC: 0 % (ref 0.0–0.2)

## 2018-03-16 LAB — BASIC METABOLIC PANEL
Anion gap: 11 (ref 5–15)
BUN: 25 mg/dL — ABNORMAL HIGH (ref 8–23)
CALCIUM: 8.9 mg/dL (ref 8.9–10.3)
CO2: 24 mmol/L (ref 22–32)
Chloride: 99 mmol/L (ref 98–111)
Creatinine, Ser: 1.64 mg/dL — ABNORMAL HIGH (ref 0.44–1.00)
GFR calc non Af Amer: 29 mL/min — ABNORMAL LOW (ref >60)
GFR, EST AFRICAN AMERICAN: 33 mL/min — AB (ref >60)
Glucose, Bld: 124 mg/dL — ABNORMAL HIGH (ref 70–99)
Potassium: 4.2 mmol/L (ref 3.5–5.1)
Sodium: 134 mmol/L — ABNORMAL LOW (ref 135–145)

## 2018-03-16 MED ORDER — ACETAMINOPHEN 500 MG PO TABS
1000.0000 mg | ORAL_TABLET | Freq: Once | ORAL | Status: AC
  Administered 2018-03-16: 1000 mg via ORAL
  Filled 2018-03-16: qty 2

## 2018-03-16 MED ORDER — DOXYCYCLINE HYCLATE 100 MG PO CAPS: 100.0000 mg | ORAL_CAPSULE | Freq: Two times a day (BID) | ORAL | 0 refills | Status: DC

## 2018-03-16 MED ORDER — LIDOCAINE HCL URETHRAL/MUCOSAL 2 % EX GEL
CUTANEOUS | Status: AC
  Administered 2018-03-16: 1
  Filled 2018-03-16: qty 10

## 2018-03-16 NOTE — ED Provider Notes (Signed)
[facility] EMERGENCY DEPARTMENT Provider Note   CSN: [id number] Arrival date & time: 03/16/18  1423     History   Chief Complaint Chief Complaint  Patient presents with  . Epistaxis    HPI [name] is a 84 y.o. female.  Pt presents to the ED today with a nosebleed.  The pt came to the ED on 11/28 and had her right nare cauterized with silver nitrate.  This stopped the bleeding.  However, the pt said she had some bleeding yesterday which stopped on its own.  It started bleeding again today and it has not stopped.  Pt is on Eliquis for afib.  CHA2DS2/VAS Stroke Risk Points  Current as of 2 minutes ago     6 >= 2 Points: High Risk  1 - 1.99 Points: Medium Risk  0 Points: Low Risk    This is the only CHA2DS2/VAS Stroke Risk Points available for the past  year.:  Last Change: N/A     Details    This score determines the patient's risk of having a stroke if the  patient has atrial fibrillation.       Points Metrics  1 Has Congestive Heart Failure:  Yes    Current as of 2 minutes ago  1 Has Vascular Disease:  Yes    Current as of 2 minutes ago  1 Has Hypertension:  Yes    Current as of 2 minutes ago  2 Age:  41    Current as of 2 minutes ago  0 Has Diabetes:  No    Current as of 2 minutes ago  0 Had Stroke:  No  Had TIA:  No  Had thromboembolism:  No    Current as of 2 minutes ago  1 Female:  Yes    Current as of 2 minutes ago              Past Medical History:  Diagnosis Date  . Anxiety   . Arthritis   . Atrial fibrillation ([place])   . Bursitis    Left shoulder  . Cataract   . CHF (congestive heart failure) ([place])   . COPD (chronic obstructive pulmonary disease) ([place])   . Coronary atherosclerosis of native coronary artery    a. s/p DES to LCx in 04/2013 b. cath in 11/2015 showing patent stent with 20% prox-LAD and 80% ostial RCA stenosis for which medical management was recommended due to small artery size  . Depression   . Diastolic heart failure  (HCC)    EF 55-60%  . Dysphagia, unspecified(787.20)   . Dyspnea   . Dysrhythmia   . Essential hypertension   . GERD (gastroesophageal reflux disease)    Hx Schatzki's ring, multiple EGD/ED last 01/06/2004  . Gout   . Headache   . History of anemia   . Hyperlipidemia   . Internal hemorrhoids without mention of complication   . MI (myocardial infarction) ([place]) 2006  . Microscopic colitis 2003  . Panic disorder without agoraphobia   . Paresthesia   . Pneumonia 12/2011  . PVD (peripheral vascular disease) ([place])   . S/P colonoscopy 09/27/2001   internal hemorrhoids, desc colon inflam polyp, SB BX-chronic duodenitis, colitis  . Thyroid disease     Patient Active Problem List   Diagnosis Date Noted  . Persistent atrial fibrillation 12/30/2017  . Chronic combined systolic and diastolic congestive heart failure ([place]) 12/30/2017  . Hyperammonemia ([place]) 02/20/2017  . Myoclonic jerking 02/20/2017  . Atrial  fibrillation ([place]) 02/19/2017  . Acute gout 02/17/2017  . Chronic systolic heart failure ([place])   . Chest pain 09/30/2016  . Status post lumbar spine surgery for decompression of spinal cord 08/07/2016  . Lumbosacral spondylosis with radiculopathy 08/01/2016  . Preoperative clearance 07/05/2016  . Cough 05/26/2016  . Weakness 04/26/2016  . Nausea without vomiting 04/26/2016  . Atrial fibrillation with RVR ([place]) 04/15/2016  . DOE (dyspnea on exertion) 03/20/2016  . Hypoxia   . Acute on chronic diastolic congestive heart failure ([place])   . Chronic kidney disease, stage III (moderate) ([place]) 02/17/2016  . Essential hypertension 02/17/2016  . COPD exacerbation ([place]) 02/17/2016  . Acute bronchitis 12/18/2015  . CAD in native artery   . Chest pain at rest 10/15/2015  . Chronic low back pain 10/15/2015  . Hyponatremia 10/15/2015  . Hyperglycemia 10/15/2015  . Thrombocytosis ([place]) 10/15/2015  . Atypical chest pain 10/15/2015  . Depression   . Anxiety   . Gastroesophageal reflux  disease without esophagitis   . Mild cognitive impairment 10/14/2015  . Iron deficiency anemia due to chronic blood loss   . Meningitis 10/01/2015  . Coronary artery disease due to lipid rich plaque   . NSVT (nonsustained ventricular tachycardia) ([place])   . PAF (paroxysmal atrial fibrillation) ([place])   . CSF leak 09/27/2015  . Spondylolisthesis of lumbar region 09/24/2015  . Gastric polyp   . History of colonic polyps   . Chronic diarrhea   . Esophageal dysphagia 04/01/2015  . PAOD (peripheral arterial occlusive disease) ([place]) 03/05/2015  . Pain in the chest   . Hyperlipidemia   . Weight gain 08/12/2014  . Anemia 08/07/2014  . Hemorrhoids 08/06/2014  . CHF (congestive heart failure) ([place]) 07/29/2014  . Rectal bleeding 07/06/2014  . Constipation 07/06/2014  . Lower extremity edema 11/24/2013  . Acute kidney failure ([place]) 11/24/2013  . Hypokalemia 11/24/2013  . Other and unspecified angina pectoris 05/14/2013  . Hematochezia 02/13/2011  . Abdominal pain 02/13/2011  . Major depression ([place]) 09/28/2010  . FATIGUE 04/13/2009  . Chronic diastolic heart failure ([place]) 11/30/2008  . DYSPNEA 11/30/2008  . CAD, NATIVE VESSEL - PCI + DES to left circumflex 05/14/13 11/27/2008  . Peripheral vascular disease ([place]) 11/27/2008  . PANIC ATTACK 02/28/2008  . MI 02/28/2008  . Internal hemorrhoids 02/28/2008  . COLITIS 02/28/2008  . Dysphagia 02/28/2008    Past Surgical History:  Procedure Laterality Date  . ABDOMINAL HYSTERECTOMY    . ABDOMINAL HYSTERECTOMY    . ANTERIOR AND POSTERIOR REPAIR     with resection of vagina  . ANTERIOR LAT LUMBAR FUSION N/A 08/01/2016   Procedure: Lumbar Two-Lumbar Five Transpsoas lateral interbody fusion with Lumbar Two-Three lateral plate fixation;  Surgeon: Kevan Ny Ditty, MD;  Location: [place];  Service: Neurosurgery;  Laterality: N/A;  L2-5 Transpsoas lateral interbody fusion with L2-3 lateral plate fixation  . APPENDECTOMY    . BACK SURGERY    .  BIOPSY  07/05/2015   Procedure: BIOPSY;  Surgeon: Daneil Dolin, MD;  [facility];  Service: Endoscopy;;  gastric polyp biopsy, ascending colon biopsy  . BLADDER SUSPENSION  11/09/2011   Procedure: TRANSVAGINAL TAPE (TVT) PROCEDURE;  Surgeon: Marissa Nestle, MD;  Location: [facility] ORS;  Service: Urology;  Laterality: N/A;  . bladder tack  06/2010  . BREAST LUMPECTOMY  1998   left, benign  . CARDIAC CATHETERIZATION    . CARDIAC CATHETERIZATION    . CARDIAC CATHETERIZATION N/A 12/16/2015   Procedure: Left  Heart Cath and Coronary Angiography;  Surgeon: Troy Sine, MD;  Location: [place] CV LAB;  Service: Cardiovascular;  Laterality: N/A;  . CARDIOVERSION N/A 10/04/2017   Procedure: CARDIOVERSION;  Surgeon: Herminio Commons, MD;  Location: [facility];  Service: Cardiovascular;  Laterality: N/A;  . CARDIOVERSION N/A 01/30/2018   Procedure: CARDIOVERSION;  Surgeon: Herminio Commons, MD;  [facility];  Service: Cardiovascular;  Laterality: N/A;  . [place]   left  . cataract surgery    . CHOLECYSTECTOMY  1998  . Cholecystectomy    . COLONOSCOPY  03/16/2011   multiple hyperplastic colon polyps, sigmoid diverticulosis, melanosis coli  . COLONOSCOPY WITH PROPOFOL N/A 07/05/2015   RMR:one 5 mm polyp in descending colon  . CORONARY ANGIOPLASTY WITH STENT PLACEMENT    . ESOPHAGEAL DILATION N/A 07/05/2015   Procedure: ESOPHAGEAL DILATION;  Surgeon: Daneil Dolin, MD;  [facility];  Service: Endoscopy;  Laterality: N/A;  . ESOPHAGOGASTRODUODENOSCOPY (EGD) WITH PROPOFOL N/A 07/05/2015   BOF:BPZWCH  . JOINT REPLACEMENT Right 2007   right knee  . left hand surgery    . LEFT HEART CATHETERIZATION WITH CORONARY ANGIOGRAM N/A 05/14/2013   Procedure: LEFT HEART CATHETERIZATION WITH CORONARY ANGIOGRAM;  Surgeon: Blane Ohara, MD;  Location: [facility] CATH LAB;  Service: Cardiovascular;  Laterality: N/A;  . left rotator cuff surgery    . LUMBAR  LAMINECTOMY/DECOMPRESSION MICRODISCECTOMY N/A 10/11/2012   Procedure: LUMBAR LAMINECTOMY/DECOMPRESSION MICRODISCECTOMY 2 LEVELS;  Surgeon: Floyce Stakes, MD;  Location: [place] NEURO ORS;  Service: Neurosurgery;  Laterality: N/A;  L3-4 L4-5 Laminectomy  . LUMBAR WOUND DEBRIDEMENT N/A 09/27/2015   Procedure: Exploration of Lumbar Wound w/ Repair CSF Leak/Lumbar Drain Placement;  Surgeon: Leeroy Cha, MD;  Location: [place] NEURO ORS;  Service: Neurosurgery;  Laterality: N/A;  . MALONEY DILATION  03/16/2011   Gastritis. No H.pylori on bx. 66F maloney dilation with disruption of  occult cevical esophageal web  . NASAL SINUS SURGERY    . right knee replacement  2007  . right leg benign tumor    . SHOULDER SURGERY Left   . TEE WITHOUT CARDIOVERSION N/A 10/04/2017   Procedure: TRANSESOPHAGEAL ECHOCARDIOGRAM (TEE) WITH PROPOFOL;  Surgeon: Herminio Commons, MD;  Location: [facility];  Service: Cardiovascular;  Laterality: N/A;  . TONSILLECTOMY    . unspecified area, hysterectomy  1972   partial     OB History    Gravida  8   Para  6   Term      Preterm      AB  2   Living  5     SAB      TAB      Ectopic      Multiple      Live Births               Home Medications    Prior to Admission medications   Medication Sig Start Date End Date Taking? Authorizing Provider  acetaminophen (TYLENOL) 500 MG tablet Take 1,000 mg by mouth daily as needed for headache.    Yes [provider]  ALPRAZolam (XANAX) 0.25 MG tablet Take 0.25 mg by mouth 2 (two) times daily as needed. 09/04/16  Yes [provider]  amiodarone (PACERONE) 200 MG tablet Take 1 tablet (200 mg total) by mouth daily. 12/10/17  Yes Evans Lance, MD  ELIQUIS 5 MG TABS tablet TAKE ONE TABLET BY MOUTH TWICE DAILY. 01/28/18  Yes Kate Sable  A, MD  fluticasone (FLONASE) 50 MCG/ACT nasal spray Place 2 sprays into both nostrils 2 (two) times daily as needed for allergies.    Yes [provider]    furosemide (LASIX) 20 MG tablet Take 20 mg by mouth daily as needed for fluid or edema.   Yes [provider]  furosemide (LASIX) 40 MG tablet Take 40 mg by mouth daily. 12/03/17  Yes [provider]  ipratropium-albuterol (DUONEB) 0.5-2.5 (3) MG/3ML SOLN Take 3 mLs by nebulization every 6 (six) hours. 12/18/15  Yes Regalado, Belkys A, MD  isosorbide mononitrate (IMDUR) 60 MG 24 hr tablet Take 1 tablet (60 mg total) by mouth daily. 02/22/18  Yes Herminio Commons, MD  Magnesium Oxide 400 (240 Mg) MG TABS TAKE ONE TABLET BY MOUTH DAILY. 02/22/17  Yes Herminio Commons, MD  methocarbamol (ROBAXIN) 500 MG tablet Take 500 mg by mouth every 6 (six) hours as needed for muscle spasms.    Yes [provider]  metoprolol succinate (TOPROL-XL) 50 MG 24 hr tablet Take 1 tablet (50 mg total) by mouth daily. Take with or immediately following a meal. 01/30/18 04/30/18 Yes Herminio Commons, MD  Multiple Vitamin (MULTIVITAMIN WITH MINERALS) TABS tablet Take 1 tablet by mouth every other day. Centrum    Yes [provider]  nitroGLYCERIN (NITROSTAT) 0.4 MG SL tablet Place 1 tablet (0.4 mg total) under the tongue every 5 (five) minutes as needed for chest pain. Reported on 08/04/2015 11/20/17  Yes Herminio Commons, MD  pantoprazole (PROTONIX) 40 MG tablet Take 40 mg by mouth daily.    Yes [provider]  potassium chloride (K-DUR,KLOR-CON) 10 MEQ tablet TAKE TWO (2) TABLETS BY MOUTH DAILY. 10/26/17  Yes Herminio Commons, MD  doxycycline (VIBRAMYCIN) 100 MG capsule Take 1 capsule (100 mg total) by mouth 2 (two) times daily. 03/16/18   Isla Pence, MD  DULoxetine (CYMBALTA) 60 MG capsule Take 60 mg by mouth at bedtime.     [provider]  lisinopril (PRINIVIL,ZESTRIL) 2.5 MG tablet Take 1 tablet (2.5 mg total) by mouth daily. 09/18/17 03/14/18  Herminio Commons, MD    Family History Family History  Problem Relation Age of Onset  . Stroke Mother    . Parkinson's disease Father   . Coronary artery disease Other        family Hx-sons  . Cancer Other   . Stroke Other        family Hx  . Hypertension Other        family Hx  . Diabetes Brother   . Heart disease Son        before age 53  . Diabetes Son   . Stroke Daughter 70  . Colon cancer Neg Hx     Social History Social History   Tobacco Use  . Smoking status: Former Smoker    Packs/day: 1.00    Years: 64.00    Pack years: 64.00    Types: Cigarettes    Start date: 12/24/1947    Last attempt to quit: 11/17/2001    Years since quitting: 16.3  . Smokeless tobacco: Never Used  . Tobacco comment: Quit smoking in 2003  Substance Use Topics  . Alcohol use: No    Alcohol/week: 0.0 standard drinks  . Drug use: No     Allergies   Cephalosporins; Levaquin [levofloxacin in d5w]; Macrodantin [nitrofurantoin macrocrystal]; Phenothiazines; Polysorbate; Prednisone; Buspirone; Cardura [doxazosin mesylate]; Codeine; Acyclovir and related; Prochlorperazine; Ranexa [ranolazine]; Atorvastatin; Ofloxacin;  Other; Penicillins; and Pimozide   Review of Systems Review of Systems  HENT: Positive for nosebleeds.   Neurological: Positive for headaches.  All other systems reviewed and are negative.    Physical Exam Updated Vital Signs BP 126/60   Pulse 65   Temp 98 F (36.7 C) (Oral)   Resp 20   Ht 5\' 1"  (1.549 m)   Wt 73.6 kg   SpO2 97%   BMI 30.65 kg/m   Physical Exam  Constitutional: She is oriented to person, place, and time. She appears well-developed and well-nourished.  HENT:  Head: Normocephalic and atraumatic.  Right Ear: External ear normal.  Left Ear: External ear normal.  Nose: Epistaxis is observed.  Mouth/Throat: Oropharynx is clear and moist.  Eyes: Pupils are equal, round, and reactive to light. Conjunctivae and EOM are normal.  Neck: Normal range of motion. Neck supple.  Cardiovascular: Normal rate, normal heart sounds and intact distal pulses. An  irregularly irregular rhythm present.  Pulmonary/Chest: Effort normal and breath sounds normal.  Abdominal: Soft. Bowel sounds are normal.  Musculoskeletal: Normal range of motion.  Neurological: She is alert and oriented to person, place, and time.  Skin: Skin is warm. Capillary refill takes less than 2 seconds.  Psychiatric: She has a normal mood and affect. Her behavior is normal. Judgment and thought content normal.  Nursing note and vitals reviewed.    ED Treatments / Results  Labs (all labs ordered are listed, but only abnormal results are displayed) Labs Reviewed  BASIC METABOLIC PANEL - Abnormal; Notable for the following components:      Result Value   Sodium 134 (*)    Glucose, Bld 124 (*)    BUN 25 (*)    Creatinine, Ser 1.64 (*)    GFR calc non Af Amer 29 (*)    GFR calc Af Amer 33 (*)    All other components within normal limits  CBC WITH DIFFERENTIAL/PLATELET - Abnormal; Notable for the following components:   Hemoglobin 11.3 (*)    All other components within normal limits    EKG None  Radiology Ct Head Wo Contrast  Result Date: 03/14/2018 CLINICAL DATA:  Epistaxis EXAM: CT HEAD WITHOUT CONTRAST TECHNIQUE: Contiguous axial images were obtained from the base of the skull through the vertex without intravenous contrast. COMPARISON:  CT brain 05/07/2016 FINDINGS: Brain: No acute territorial infarction, hemorrhage or intracranial mass. Minimal small vessel ischemic changes of the white matter. Stable ventricle size. Vascular: No hyperdense vessels.  Carotid vascular calcification Skull: Normal. Negative for fracture or focal lesion. Sinuses/Orbits: Mucosal thickening in the right maxillary and ethmoid sinuses. No acute orbital abnormality. Other: None IMPRESSION: 1. No CT evidence for acute intracranial abnormality. 2. Minimal small vessel ischemic changes of the white matter Electronically Signed   By: Donavan Foil M.D.   On: 03/14/2018 17:53     Procedures .Epistaxis Management Date/Time: 03/16/2018 5:03 PM Performed by: Isla Pence, MD Authorized by: Isla Pence, MD   Consent:    Consent obtained:  Verbal   Consent given by:  Patient   Risks discussed:  Bleeding, pain, infection and nasal injury   Alternatives discussed:  No treatment Anesthesia (see MAR for exact dosages):    Anesthesia method:  Topical application   Topical anesthetic:  Lidocaine gel Procedure details:    Treatment site:  R anterior   Treatment method:  Nasal balloon   Treatment complexity:  Limited   Treatment episode: recurring   Post-procedure details:  Assessment:  Bleeding stopped   Patient tolerance of procedure:  Tolerated well, no immediate complications Comments:     No air placed in balloon.  Bleeding stopped without needing it.   (including critical care time)  Medications Ordered in ED Medications  lidocaine (XYLOCAINE) 2 % jelly (1 application  Given by Other 03/16/18 1549)  acetaminophen (TYLENOL) tablet 1,000 mg (1,000 mg Oral Given 03/16/18 1657)     Initial Impression / Assessment and Plan / ED Course  I have reviewed the triage vital signs and the nursing notes.  Pertinent labs & imaging results that were available during my care of the patient were reviewed by me and considered in my medical decision making (see chart for details).    Pt was able to ambulate without the nose re-bleeding.  I gave the daughter a 5 cc syringe and told her that it is ok to put up to 2 cc of air into balloon if it starts to re-bleed at home.  If that does not work, she needs to come back here.  Pt to f/u with ENT or ED to have packing removed in 2-3 days.  Final Clinical Impressions(s) / ED Diagnoses   Final diagnoses:  Epistaxis  On apixaban therapy    ED Discharge Orders         Ordered    doxycycline (VIBRAMYCIN) 100 MG capsule  2 times daily     03/16/18 1657           Isla Pence, MD 03/16/18 1705

## 2018-03-16 NOTE — Discharge Instructions (Signed)
Return to the ED or to ENT for packing removal in 2-3 days.

## 2018-03-16 NOTE — ED Triage Notes (Signed)
Patient reports continuing epistaxis, seen here on 11/28 for the same. Patient states she has had "5 or 6 really bad episodes."

## 2018-03-16 NOTE — ED Notes (Signed)
Pt ambulated around the nurses station. Tolerated well. No assistance needed.

## 2018-03-17 ENCOUNTER — Encounter (HOSPITAL_COMMUNITY): Payer: Self-pay | Admitting: *Deleted

## 2018-03-17 ENCOUNTER — Emergency Department (HOSPITAL_COMMUNITY)
Admission: EM | Admit: 2018-03-17 | Discharge: 2018-03-17 | Disposition: A | Discharge status: Home / Self Care | Payer: Medicare Other | Attending: Emergency Medicine | Admitting: Emergency Medicine

## 2018-03-17 DIAGNOSIS — Z7901 Long term (current) use of anticoagulants: Secondary | ICD-10-CM | POA: Insufficient documentation

## 2018-03-17 DIAGNOSIS — N183 Chronic kidney disease, stage 3 (moderate): Secondary | ICD-10-CM | POA: Insufficient documentation

## 2018-03-17 DIAGNOSIS — R040 Epistaxis: Secondary | ICD-10-CM | POA: Diagnosis not present

## 2018-03-17 DIAGNOSIS — I2510 Atherosclerotic heart disease of native coronary artery without angina pectoris: Secondary | ICD-10-CM | POA: Insufficient documentation

## 2018-03-17 DIAGNOSIS — I5042 Chronic combined systolic (congestive) and diastolic (congestive) heart failure: Secondary | ICD-10-CM | POA: Insufficient documentation

## 2018-03-17 DIAGNOSIS — Z79899 Other long term (current) drug therapy: Secondary | ICD-10-CM | POA: Diagnosis not present

## 2018-03-17 DIAGNOSIS — J449 Chronic obstructive pulmonary disease, unspecified: Secondary | ICD-10-CM | POA: Diagnosis not present

## 2018-03-17 DIAGNOSIS — Z87891 Personal history of nicotine dependence: Secondary | ICD-10-CM | POA: Insufficient documentation

## 2018-03-17 DIAGNOSIS — R05 Cough: Secondary | ICD-10-CM | POA: Insufficient documentation

## 2018-03-17 DIAGNOSIS — R531 Weakness: Secondary | ICD-10-CM | POA: Diagnosis not present

## 2018-03-17 DIAGNOSIS — I130 Hypertensive heart and chronic kidney disease with heart failure and stage 1 through stage 4 chronic kidney disease, or unspecified chronic kidney disease: Secondary | ICD-10-CM | POA: Diagnosis not present

## 2018-03-17 DIAGNOSIS — I4891 Unspecified atrial fibrillation: Secondary | ICD-10-CM | POA: Diagnosis not present

## 2018-03-17 LAB — BASIC METABOLIC PANEL
ANION GAP: 9 (ref 5–15)
BUN: 23 mg/dL (ref 8–23)
CO2: 26 mmol/L (ref 22–32)
CREATININE: 1.49 mg/dL — AB (ref 0.44–1.00)
Calcium: 8.5 mg/dL — ABNORMAL LOW (ref 8.9–10.3)
Chloride: 102 mmol/L (ref 98–111)
GFR calc Af Amer: 37 mL/min — ABNORMAL LOW (ref >60)
GFR calc non Af Amer: 32 mL/min — ABNORMAL LOW (ref >60)
Glucose, Bld: 93 mg/dL (ref 70–99)
Potassium: 3.6 mmol/L (ref 3.5–5.1)
Sodium: 137 mmol/L (ref 135–145)

## 2018-03-17 LAB — CBC WITH DIFFERENTIAL/PLATELET
Abs Immature Granulocytes: 0.09 10*3/uL — ABNORMAL HIGH (ref 0.00–0.07)
Basophils Absolute: 0.1 10*3/uL (ref 0.0–0.1)
Basophils Relative: 1 %
Eosinophils Absolute: 0.2 10*3/uL (ref 0.0–0.5)
Eosinophils Relative: 3 %
HEMATOCRIT: 34 % — AB (ref 36.0–46.0)
HEMOGLOBIN: 10.5 g/dL — AB (ref 12.0–15.0)
Immature Granulocytes: 1 %
LYMPHS ABS: 2 10*3/uL (ref 0.7–4.0)
Lymphocytes Relative: 23 %
MCH: 27.8 pg (ref 26.0–34.0)
MCHC: 30.9 g/dL (ref 30.0–36.0)
MCV: 89.9 fL (ref 80.0–100.0)
Monocytes Absolute: 0.7 10*3/uL (ref 0.1–1.0)
Monocytes Relative: 8 %
Neutro Abs: 5.8 10*3/uL (ref 1.7–7.7)
Neutrophils Relative %: 64 %
Platelets: 318 10*3/uL (ref 150–400)
RBC: 3.78 MIL/uL — ABNORMAL LOW (ref 3.87–5.11)
RDW: 14.8 % (ref 11.5–15.5)
WBC: 9 10*3/uL (ref 4.0–10.5)
nRBC: 0 % (ref 0.0–0.2)

## 2018-03-17 MED ORDER — SODIUM CHLORIDE 0.9 % IV BOLUS
500.0000 mL | Freq: Once | INTRAVENOUS | Status: AC
  Administered 2018-03-17: 500 mL via INTRAVENOUS

## 2018-03-17 MED ORDER — SODIUM CHLORIDE 0.9 % IV SOLN
INTRAVENOUS | Status: DC
  Administered 2018-03-17 (×2): via INTRAVENOUS

## 2018-03-17 NOTE — Discharge Instructions (Addendum)
Continue to keep the balloon in your nose.  Take Tylenol as needed for pain.  Make sure that you are drinking plenty of fluids, especially water.  Call the ENT doctor for a follow-up appointment for further evaluation and treatment of the bleeding.

## 2018-03-17 NOTE — ED Triage Notes (Signed)
Pt states blood is going down her throat.  C/o HA

## 2018-03-17 NOTE — ED Triage Notes (Signed)
Pt with nosebleed x 3 today. No bleeding noted at present, packing still in place to right nare.

## 2018-03-17 NOTE — ED Provider Notes (Signed)
[facility] EMERGENCY DEPARTMENT Provider Note   CSN: [id number] Arrival date & time: 03/17/18  1227     History   Chief Complaint Chief Complaint  Patient presents with  . Epistaxis    HPI [name] is a 84 y.o. female.  HPI   The patient presents for evaluation of persistent nosebleeding associated with some weakness and decreased oral intake.  She is concerned that she continues to cough and spit out blood clots, and has been having difficulty tolerating water without vomiting.  She denies fever, chills, chest pain, shortness of breath, focal weakness or paresthesia.  She was evaluated yesterday in the ED, stabilized and treated with nasal tampon not using a Rhino Rocket.  The bleeding apparently improved with placement of the Rhino Rocket without instilling air.  Since going home the patient has put 2 cc of air and 3 different times, to help decrease bleeding.  She is concerned that she continues to bleed despite putting 6 cc of air into the balloon.  She has not previously had this problem.  She is on Eliquis for atrial fibrillation.  No other known modifying factors.  Past Medical History:  Diagnosis Date  . Anxiety   . Arthritis   . Atrial fibrillation ([place])   . Bursitis    Left shoulder  . Cataract   . CHF (congestive heart failure) ([place])   . COPD (chronic obstructive pulmonary disease) ([place])   . Coronary atherosclerosis of native coronary artery    a. s/p DES to LCx in 04/2013 b. cath in 11/2015 showing patent stent with 20% prox-LAD and 80% ostial RCA stenosis for which medical management was recommended due to small artery size  . Depression   . Diastolic heart failure (HCC)    EF 55-60%  . Dysphagia, unspecified(787.20)   . Dyspnea   . Dysrhythmia   . Essential hypertension   . GERD (gastroesophageal reflux disease)    Hx Schatzki's ring, multiple EGD/ED last 01/06/2004  . Gout   . Headache   . History of anemia   . Hyperlipidemia   . Internal  hemorrhoids without mention of complication   . MI (myocardial infarction) ([place]) 2006  . Microscopic colitis 2003  . Panic disorder without agoraphobia   . Paresthesia   . Pneumonia 12/2011  . PVD (peripheral vascular disease) ([place])   . S/P colonoscopy 09/27/2001   internal hemorrhoids, desc colon inflam polyp, SB BX-chronic duodenitis, colitis  . Thyroid disease     Patient Active Problem List   Diagnosis Date Noted  . Persistent atrial fibrillation 12/30/2017  . Chronic combined systolic and diastolic congestive heart failure ([place]) 12/30/2017  . Hyperammonemia ([place]) 02/20/2017  . Myoclonic jerking 02/20/2017  . Atrial fibrillation ([place]) 02/19/2017  . Acute gout 02/17/2017  . Chronic systolic heart failure ([place])   . Chest pain 09/30/2016  . Status post lumbar spine surgery for decompression of spinal cord 08/07/2016  . Lumbosacral spondylosis with radiculopathy 08/01/2016  . Preoperative clearance 07/05/2016  . Cough 05/26/2016  . Weakness 04/26/2016  . Nausea without vomiting 04/26/2016  . Atrial fibrillation with RVR ([place]) 04/15/2016  . DOE (dyspnea on exertion) 03/20/2016  . Hypoxia   . Acute on chronic diastolic congestive heart failure ([place])   . Chronic kidney disease, stage III (moderate) ([place]) 02/17/2016  . Essential hypertension 02/17/2016  . COPD exacerbation ([place]) 02/17/2016  . Acute bronchitis 12/18/2015  . CAD in native artery   . Chest pain at rest 10/15/2015  .  Chronic low back pain 10/15/2015  . Hyponatremia 10/15/2015  . Hyperglycemia 10/15/2015  . Thrombocytosis ([place]) 10/15/2015  . Atypical chest pain 10/15/2015  . Depression   . Anxiety   . Gastroesophageal reflux disease without esophagitis   . Mild cognitive impairment 10/14/2015  . Iron deficiency anemia due to chronic blood loss   . Meningitis 10/01/2015  . Coronary artery disease due to lipid rich plaque   . NSVT (nonsustained ventricular tachycardia) ([place])   . PAF (paroxysmal atrial  fibrillation) ([place])   . CSF leak 09/27/2015  . Spondylolisthesis of lumbar region 09/24/2015  . Gastric polyp   . History of colonic polyps   . Chronic diarrhea   . Esophageal dysphagia 04/01/2015  . PAOD (peripheral arterial occlusive disease) ([place]) 03/05/2015  . Pain in the chest   . Hyperlipidemia   . Weight gain 08/12/2014  . Anemia 08/07/2014  . Hemorrhoids 08/06/2014  . CHF (congestive heart failure) ([place]) 07/29/2014  . Rectal bleeding 07/06/2014  . Constipation 07/06/2014  . Lower extremity edema 11/24/2013  . Acute kidney failure ([place]) 11/24/2013  . Hypokalemia 11/24/2013  . Other and unspecified angina pectoris 05/14/2013  . Hematochezia 02/13/2011  . Abdominal pain 02/13/2011  . Major depression ([place]) 09/28/2010  . FATIGUE 04/13/2009  . Chronic diastolic heart failure ([place]) 11/30/2008  . DYSPNEA 11/30/2008  . CAD, NATIVE VESSEL - PCI + DES to left circumflex 05/14/13 11/27/2008  . Peripheral vascular disease ([place]) 11/27/2008  . PANIC ATTACK 02/28/2008  . MI 02/28/2008  . Internal hemorrhoids 02/28/2008  . COLITIS 02/28/2008  . Dysphagia 02/28/2008    Past Surgical History:  Procedure Laterality Date  . ABDOMINAL HYSTERECTOMY    . ABDOMINAL HYSTERECTOMY    . ANTERIOR AND POSTERIOR REPAIR     with resection of vagina  . ANTERIOR LAT LUMBAR FUSION N/A 08/01/2016   Procedure: Lumbar Two-Lumbar Five Transpsoas lateral interbody fusion with Lumbar Two-Three lateral plate fixation;  Surgeon: Kevan Ny Ditty, MD;  Location: [facility];  Service: Neurosurgery;  Laterality: N/A;  L2-5 Transpsoas lateral interbody fusion with L2-3 lateral plate fixation  . APPENDECTOMY    . BACK SURGERY    . BIOPSY  07/05/2015   Procedure: BIOPSY;  Surgeon: Daneil Dolin, MD;  [facility];  Service: Endoscopy;;  gastric polyp biopsy, ascending colon biopsy  . BLADDER SUSPENSION  11/09/2011   Procedure: TRANSVAGINAL TAPE (TVT) PROCEDURE;  Surgeon: Marissa Nestle, MD;   Location: [facility] ORS;  Service: Urology;  Laterality: N/A;  . bladder tack  06/2010  . BREAST LUMPECTOMY  1998   left, benign  . CARDIAC CATHETERIZATION    . CARDIAC CATHETERIZATION    . CARDIAC CATHETERIZATION N/A 12/16/2015   Procedure: Left Heart Cath and Coronary Angiography;  Surgeon: Troy Sine, MD;  Location: [facility] CV LAB;  Service: Cardiovascular;  Laterality: N/A;  . CARDIOVERSION N/A 10/04/2017   Procedure: CARDIOVERSION;  Surgeon: Herminio Commons, MD;  Location: [facility] OR[facility];  Service: Cardiovascular;  Laterality: N/A;  . CARDIOVERSION N/A 01/30/2018   Procedure: CARDIOVERSION;  Surgeon: Herminio Commons, MD;  [facility];  Service: Cardiovascular;  Laterality: N/A;  . [place]   left  . cataract surgery    . CHOLECYSTECTOMY  1998  . Cholecystectomy    . COLONOSCOPY  03/16/2011   multiple hyperplastic colon polyps, sigmoid diverticulosis, melanosis coli  . COLONOSCOPY WITH PROPOFOL N/A 07/05/2015   RMR:one 5 mm polyp in descending colon  .  CORONARY ANGIOPLASTY WITH STENT PLACEMENT    . ESOPHAGEAL DILATION N/A 07/05/2015   Procedure: ESOPHAGEAL DILATION;  Surgeon: Daneil Dolin, MD;  [facility];  Service: Endoscopy;  Laterality: N/A;  . ESOPHAGOGASTRODUODENOSCOPY (EGD) WITH PROPOFOL N/A 07/05/2015   WUX:LKGMWN  . JOINT REPLACEMENT Right 2007   right knee  . left hand surgery    . LEFT HEART CATHETERIZATION WITH CORONARY ANGIOGRAM N/A 05/14/2013   Procedure: LEFT HEART CATHETERIZATION WITH CORONARY ANGIOGRAM;  Surgeon: Blane Ohara, MD;  Location: [facility] CATH LAB;  Service: Cardiovascular;  Laterality: N/A;  . left rotator cuff surgery    . LUMBAR LAMINECTOMY/DECOMPRESSION MICRODISCECTOMY N/A 10/11/2012   Procedure: LUMBAR LAMINECTOMY/DECOMPRESSION MICRODISCECTOMY 2 LEVELS;  Surgeon: Floyce Stakes, MD;  Location: [facility] NEURO ORS;  Service: Neurosurgery;  Laterality: N/A;  L3-4 L4-5 Laminectomy  . LUMBAR WOUND DEBRIDEMENT N/A  09/27/2015   Procedure: Exploration of Lumbar Wound w/ Repair CSF Leak/Lumbar Drain Placement;  Surgeon: Leeroy Cha, MD;  Location: [place] NEURO ORS;  Service: Neurosurgery;  Laterality: N/A;  . MALONEY DILATION  03/16/2011   Gastritis. No H.pylori on bx. 61F maloney dilation with disruption of  occult cevical esophageal web  . NASAL SINUS SURGERY    . right knee replacement  2007  . right leg benign tumor    . SHOULDER SURGERY Left   . TEE WITHOUT CARDIOVERSION N/A 10/04/2017   Procedure: TRANSESOPHAGEAL ECHOCARDIOGRAM (TEE) WITH PROPOFOL;  Surgeon: Herminio Commons, MD;  Location[facility];  Service: Cardiovascular;  Laterality: N/A;  . TONSILLECTOMY    . unspecified area, hysterectomy  1972   partial     OB History    Gravida  8   Para  6   Term      Preterm      AB  2   Living  5     SAB      TAB      Ectopic      Multiple      Live Births               Home Medications    Prior to Admission medications   Medication Sig Start Date End Date Taking? Authorizing Provider  acetaminophen (TYLENOL) 500 MG tablet Take 1,000 mg by mouth daily as needed for headache.    Yes [provider]  ALPRAZolam (XANAX) 0.25 MG tablet Take 0.25 mg by mouth 2 (two) times daily as needed. 09/04/16  Yes [provider]  amiodarone (PACERONE) 200 MG tablet Take 1 tablet (200 mg total) by mouth daily. 12/10/17  Yes Evans Lance, MD  DULoxetine (CYMBALTA) 60 MG capsule Take 60 mg by mouth at bedtime.    Yes [provider]  ELIQUIS 5 MG TABS tablet TAKE ONE TABLET BY MOUTH TWICE DAILY. 01/28/18  Yes Herminio Commons, MD  fluticasone (FLONASE) 50 MCG/ACT nasal spray Place 2 sprays into both nostrils 2 (two) times daily as needed for allergies.    Yes [provider]  furosemide (LASIX) 20 MG tablet Take 20 mg by mouth daily as needed for fluid or edema.   Yes [provider]  furosemide (LASIX) 40 MG tablet Take 40 mg by mouth daily.  12/03/17  Yes [provider]  isosorbide mononitrate (IMDUR) 60 MG 24 hr tablet Take 1 tablet (60 mg total) by mouth daily. 02/22/18  Yes Herminio Commons, MD  Magnesium Oxide 400 (240 Mg) MG TABS TAKE ONE TABLET BY MOUTH DAILY. 02/22/17  Yes Herminio Commons, MD  methocarbamol (ROBAXIN) 500 MG tablet Take 500 mg by mouth every 6 (six) hours as needed for muscle spasms.    Yes [provider]  metoprolol succinate (TOPROL-XL) 50 MG 24 hr tablet Take 1 tablet (50 mg total) by mouth daily. Take with or immediately following a meal. 01/30/18 04/30/18 Yes Herminio Commons, MD  Multiple Vitamin (MULTIVITAMIN WITH MINERALS) TABS tablet Take 1 tablet by mouth every other day. Centrum    Yes [provider]  pantoprazole (PROTONIX) 40 MG tablet Take 40 mg by mouth daily.    Yes [provider]  potassium chloride (K-DUR,KLOR-CON) 10 MEQ tablet TAKE TWO (2) TABLETS BY MOUTH DAILY. 10/26/17  Yes Herminio Commons, MD  doxycycline (VIBRAMYCIN) 100 MG capsule Take 1 capsule (100 mg total) by mouth 2 (two) times daily. 03/16/18   Isla Pence, MD  ipratropium-albuterol (DUONEB) 0.5-2.5 (3) MG/3ML SOLN Take 3 mLs by nebulization every 6 (six) hours. Patient not taking: Reported on 03/17/2018 12/18/15   Regalado, Jerald Kief A, MD  lisinopril (PRINIVIL,ZESTRIL) 2.5 MG tablet Take 1 tablet (2.5 mg total) by mouth daily. 09/18/17 03/14/18  Herminio Commons, MD  nitroGLYCERIN (NITROSTAT) 0.4 MG SL tablet Place 1 tablet (0.4 mg total) under the tongue every 5 (five) minutes as needed for chest pain. Reported on 08/04/2015 11/20/17   Herminio Commons, MD    Family History Family History  Problem Relation Age of Onset  . Stroke Mother   . Parkinson's disease Father   . Coronary artery disease Other        family Hx-sons  . Cancer Other   . Stroke Other        family Hx  . Hypertension Other        family Hx  . Diabetes Brother   . Heart disease Son        before  age 90  . Diabetes Son   . Stroke Daughter 72  . Colon cancer Neg Hx     Social History Social History   Tobacco Use  . Smoking status: Former Smoker    Packs/day: 1.00    Years: 64.00    Pack years: 64.00    Types: Cigarettes    Start date: 12/24/1947    Last attempt to quit: 11/17/2001    Years since quitting: 16.3  . Smokeless tobacco: Never Used  . Tobacco comment: Quit smoking in 2003  Substance Use Topics  . Alcohol use: No    Alcohol/week: 0.0 standard drinks  . Drug use: No     Allergies   Cephalosporins; Levaquin [levofloxacin in d5w]; Macrodantin [nitrofurantoin macrocrystal]; Phenothiazines; Polysorbate; Prednisone; Buspirone; Cardura [doxazosin mesylate]; Codeine; Acyclovir and related; Prochlorperazine; Ranexa [ranolazine]; Atorvastatin; Ofloxacin; Other; Penicillins; and Pimozide   Review of Systems Review of Systems  All other systems reviewed and are negative.    Physical Exam Updated Vital Signs BP (!) 128/55 (BP Location: Left Arm)   Pulse 65   Temp (!) 97.5 F (36.4 C) (Oral)   Resp 20   Ht 5\' 1"  (1.549 m)   Wt 74.4 kg   SpO2 100%   BMI 30.99 kg/m   Physical Exam  Constitutional: She is oriented to person, place, and time. She appears well-developed and well-nourished. No distress.  HENT:  Head: Normocephalic and atraumatic.  Rhino Rocket blood in the right nares.  No active bleeding from right external nares.  No blood in oropharynx.  Eyes: Pupils are equal, round, and  reactive to light. Conjunctivae and EOM are normal.  Neck: Normal range of motion and phonation normal. Neck supple.  Cardiovascular: Normal rate.  Pulmonary/Chest: Effort normal. She exhibits no tenderness.  Musculoskeletal: Normal range of motion.  Neurological: She is alert and oriented to person, place, and time. She exhibits normal muscle tone.  Skin: Skin is warm and dry.  Psychiatric: She has a normal mood and affect. Her behavior is normal. Judgment and thought  content normal.  Nursing note and vitals reviewed.    ED Treatments / Results  Labs (all labs ordered are listed, but only abnormal results are displayed) Labs Reviewed  BASIC METABOLIC PANEL - Abnormal; Notable for the following components:      Result Value   Creatinine, Ser 1.49 (*)    Calcium 8.5 (*)    GFR calc non Af Amer 32 (*)    GFR calc Af Amer 37 (*)    All other components within normal limits  CBC WITH DIFFERENTIAL/PLATELET - Abnormal; Notable for the following components:   RBC 3.78 (*)    Hemoglobin 10.5 (*)    HCT 34.0 (*)    Abs Immature Granulocytes 0.09 (*)    All other components within normal limits    EKG None  Radiology No results found.  Procedures Procedures (including critical care time)  Medications Ordered in ED Medications  0.9 %  sodium chloride infusion ( Intravenous New Bag/Given 03/17/18 1415)  sodium chloride 0.9 % bolus 500 mL (500 mLs Intravenous New Bag/Given 03/17/18 1415)     Initial Impression / Assessment and Plan / ED Course  I have reviewed the triage vital signs and the nursing notes.  Pertinent labs & imaging results that were available during my care of the patient were reviewed by me and considered in my medical decision making (see chart for details).  Clinical Course as of Mar 17 1608  Sun Mar 17, 2018  1410 Nasal balloon in place in the right nares was deflated, it contained about 4.5 cc of air.  I re-instilled air, to 6 cc to help decrease bleeding.   [EW]  1540 Normal except creatinine high, calcium low, GFR low  Basic metabolic panel(!) [EW]  9518 Normal except hemoglobin low  CBC with Differential(!) [EW]    Clinical Course User Index [EW] Daleen Bo, MD     Patient Vitals for the past 24 hrs:  BP Temp Temp src Pulse Resp SpO2 Height Weight  03/17/18 1234 (!) 128/55 (!) 97.5 F (36.4 C) Oral 65 20 100 % 5\' 1"  (1.549 m) 74.4 kg    3:56 PM Reevaluation with update and discussion. After initial  assessment and treatment, an updated evaluation reveals comfortable now, vital signs are reassuring.  Findings discussed with patient and daughter, all questions were answered. Daleen Bo     Medical Decision Making: Epistaxis, stable, no active bleeding.  Instilled another cc and a half of air into her nasal balloon to make a total of 6 cc.  This seems to have helped to control any significant bleeding.  She continued to have a small amount of blood anterior and posterior wall observed.  Vital signs did not change.  There is no indication for hospitalization at this time.   CRITICAL CARE- No Performed by: Daleen Bo   Nursing Notes Reviewed/ Care Coordinated Applicable Imaging Reviewed Interpretation of Laboratory Data incorporated into ED treatment  The patient appears reasonably screened and/or stabilized for discharge and I doubt any other medical condition or  other [place] requiring further screening, evaluation, or treatment in the ED at this time prior to discharge.  Plan: Home Medications-continue current medications Zyrtec if needed for allergy symptoms; Home Treatments-nose care at home; return here if the recommended treatment, does not improve the symptoms; Recommended follow up-ENT follow-up 3 to 5 days for assessment of epistaxis and removal of nasal balloon    Final Clinical Impressions(s) / ED Diagnoses   Final diagnoses:  Epistaxis    ED Discharge Orders    None       Daleen Bo, MD 03/17/18 1609

## 2018-03-18 ENCOUNTER — Telehealth: Payer: Self-pay | Admitting: Cardiovascular Disease

## 2018-03-18 DIAGNOSIS — R040 Epistaxis: Secondary | ICD-10-CM

## 2018-03-18 NOTE — Telephone Encounter (Signed)
Bleeding from both sides of nares now (left side has has rhino-rocket) for past 6 days, feels weak.Has no apt with ENT yet. Patient is still on Eliquis

## 2018-03-18 NOTE — Telephone Encounter (Signed)
Hgb mildly low from yesterday but stable. Can hold Eliquis and please provide ENT referral so that this can be addressed as she requires systemic anticoagulation for atrial fibrillation.

## 2018-03-18 NOTE — Telephone Encounter (Signed)
Patient left message on voicemail regarding nose bleeds and being on Eliquis. States she had recent ER visit due to the bleeds and currently has "balloon" in her nose to keep it from bleeding. Please call patient to advise on further instructions. / tg

## 2018-03-19 MED ORDER — APIXABAN 5 MG PO TABS: ORAL_TABLET | ORAL | 0 refills | Status: DC

## 2018-03-19 NOTE — Telephone Encounter (Signed)
Urgent referral placed to Dr Benjamine Mola

## 2018-03-19 NOTE — Telephone Encounter (Signed)
I spoke with daughter, Mahum Betten, she will call Dr.Teoh's office in [place] for ASAP apt. I also told her to have mother Hold eliquis for now

## 2018-03-21 ENCOUNTER — Telehealth: Payer: Self-pay | Admitting: Cardiovascular Disease

## 2018-03-21 DIAGNOSIS — Z7901 Long term (current) use of anticoagulants: Secondary | ICD-10-CM | POA: Diagnosis not present

## 2018-03-21 DIAGNOSIS — R040 Epistaxis: Secondary | ICD-10-CM | POA: Diagnosis not present

## 2018-03-21 NOTE — Telephone Encounter (Signed)
Patient's daughter needs to speak with nurse in regards to patient restarting Eliquis after seeing ENT/tg

## 2018-03-22 ENCOUNTER — Telehealth: Payer: Self-pay | Admitting: Cardiovascular Disease

## 2018-03-22 NOTE — Telephone Encounter (Signed)
Patient saw ENT had packing removed. She has had no bleeding since, will re-start Eliquis again

## 2018-03-22 NOTE — Telephone Encounter (Signed)
Agree with you (discussed).

## 2018-03-22 NOTE — Telephone Encounter (Signed)
Pt has been off her apixaban (ELIQUIS) 5 MG TABS tablet [[id number]]  For 4 days due to severe nose bleeds, she's been to the ER 3 times and she had her packing removed yesterday.   She's needing to know when she needs to start back taking it.

## 2018-03-25 NOTE — Telephone Encounter (Signed)
Patient informed. 

## 2018-04-02 ENCOUNTER — Other Ambulatory Visit: Payer: Self-pay | Admitting: Cardiovascular Disease

## 2018-05-03 ENCOUNTER — Other Ambulatory Visit: Payer: Self-pay | Admitting: Cardiovascular Disease

## 2018-05-13 ENCOUNTER — Encounter: Payer: Self-pay | Admitting: Internal Medicine

## 2018-05-13 ENCOUNTER — Other Ambulatory Visit (HOSPITAL_COMMUNITY)
Admission: RE | Admit: 2018-05-13 | Discharge: 2018-05-13 | Disposition: A | Discharge status: Home / Self Care | Payer: Medicare Other | Source: Ambulatory Visit | Attending: Internal Medicine | Admitting: Internal Medicine

## 2018-05-13 ENCOUNTER — Ambulatory Visit (INDEPENDENT_AMBULATORY_CARE_PROVIDER_SITE_OTHER): Payer: Medicare Other | Admitting: Internal Medicine

## 2018-05-13 VITALS — BP 138/60 | HR 79 | Wt 164.0 lb

## 2018-05-13 DIAGNOSIS — R079 Chest pain, unspecified: Secondary | ICD-10-CM

## 2018-05-13 LAB — CBC WITH DIFFERENTIAL/PLATELET
Abs Immature Granulocytes: 0.08 10*3/uL — ABNORMAL HIGH (ref 0.00–0.07)
BASOS PCT: 1 %
Basophils Absolute: 0.1 10*3/uL (ref 0.0–0.1)
Eosinophils Absolute: 0.2 10*3/uL (ref 0.0–0.5)
Eosinophils Relative: 3 %
HCT: 35.6 % — ABNORMAL LOW (ref 36.0–46.0)
Hemoglobin: 11.1 g/dL — ABNORMAL LOW (ref 12.0–15.0)
Immature Granulocytes: 1 %
Lymphocytes Relative: 27 %
Lymphs Abs: 2.5 10*3/uL (ref 0.7–4.0)
MCH: 26.4 pg (ref 26.0–34.0)
MCHC: 31.2 g/dL (ref 30.0–36.0)
MCV: 84.8 fL (ref 80.0–100.0)
Monocytes Absolute: 0.8 10*3/uL (ref 0.1–1.0)
Monocytes Relative: 9 %
NEUTROS PCT: 59 %
Neutro Abs: 5.5 10*3/uL (ref 1.7–7.7)
Platelets: 372 10*3/uL (ref 150–400)
RBC: 4.2 MIL/uL (ref 3.87–5.11)
RDW: 14.3 % (ref 11.5–15.5)
WBC: 9.3 10*3/uL (ref 4.0–10.5)
nRBC: 0 % (ref 0.0–0.2)

## 2018-05-13 LAB — BASIC METABOLIC PANEL
ANION GAP: 11 (ref 5–15)
BUN: 38 mg/dL — ABNORMAL HIGH (ref 8–23)
CO2: 24 mmol/L (ref 22–32)
Calcium: 9.1 mg/dL (ref 8.9–10.3)
Chloride: 98 mmol/L (ref 98–111)
Creatinine, Ser: 1.87 mg/dL — ABNORMAL HIGH (ref 0.44–1.00)
GFR calc Af Amer: 28 mL/min — ABNORMAL LOW (ref >60)
GFR calc non Af Amer: 24 mL/min — ABNORMAL LOW (ref >60)
Glucose, Bld: 102 mg/dL — ABNORMAL HIGH (ref 70–99)
Potassium: 4.2 mmol/L (ref 3.5–5.1)
Sodium: 133 mmol/L — ABNORMAL LOW (ref 135–145)

## 2018-05-13 NOTE — Patient Instructions (Addendum)
Medication Instructions:  Your physician recommends that you continue on your current medications as directed. Please refer to the Current Medication list given to you today.  Stop Taking Eliquis Today  If you need a refill on your cardiac medications before your next appointment, please call your pharmacy.   Lab work: Your physician recommends that you return for lab work in: Today   If you have labs (blood work) drawn today and your tests are completely normal, you will receive your results only by: [name] Kitchen MyChart Message (if you have MyChart) OR . A paper copy in the mail If you have any lab test that is abnormal or we need to change your treatment, we will call you to review the results.  Testing/Procedures: Your physician has requested that you have a cardiac catheterization. Cardiac catheterization is used to diagnose and/or treat various heart conditions. Doctors may recommend this procedure for a number of different reasons. The most common reason is to evaluate chest pain. Chest pain can be a symptom of coronary artery disease (CAD), and cardiac catheterization can show whether plaque is narrowing or blocking your heart's arteries. This procedure is also used to evaluate the valves, as well as measure the blood flow and oxygen levels in different parts of your heart. For further information please visit [email or web address]. Please follow instruction sheet, as given.    Follow-Up: At [facility], you and your health needs are our priority.  As part of our continuing mission to provide you with exceptional heart care, we have created designated Provider Care Teams.  These Care Teams include your primary Cardiologist (physician) and Advanced Practice Providers (APPs -  Physician Assistants and Nurse Practitioners) who all work together to provide you with the care you need, when you need it. You will need a follow up appointment in 2 weeks.  Please call our office 2 months in advance to  schedule this appointment.  You may see Dr. Lovena [name] or one of the following Advanced Practice Providers on your designated Care Team:   Tonya Pho, PA-C [facility]) . Tonya Barrios, PA-C ([place])  Any Other Special Instructions Will Be Listed Below (If Applicable). Thank you for choosing [facility]!       [address] [place] [address] Dept: [phone] Loc: [phone]  [name]  05/13/2018  You are scheduled for a Cardiac Catheterization on Thursday, January 30 with Dr. Daneen [name].  1. Please arrive at the [facility] (Main Entrance A) at [facility]: [address] [place], [place] [address] at 10:00 AM (This time is two hours before your procedure to ensure your preparation). Free valet parking service is available.   Special note: Every effort is made to have your procedure done on time. Please understand that emergencies sometimes delay scheduled procedures.  2. Diet: Do not eat solid foods after midnight.  The patient may have clear liquids until 5am upon the day of the procedure.  3. Labs: You will need to have blood drawn on Monday, January 27 at [facility]. [address], [place]  Open: 7am - 6pm, Sat 8am - 12 noon   Phone: [phone]. You do not need to be fasting.  4. Medication instructions in preparation for your procedure:   Contrast Allergy: No  Stop Taking Eliquis Today   On the morning of your procedure, take your Aspirin and any morning medicines NOT listed above.  You may use sips of water.  5. Plan for one night stay--bring personal belongings. 6. Bring a current list of your medications and current insurance cards. 7. You MUST have a responsible person to drive you home. 8. Someone MUST be with you the first 24 hours after you arrive home or your discharge will be delayed. 9. Please wear clothes  that are easy to get on and off and wear slip-on shoes.  Thank you for allowing [place] to care for you!   -- [facility]

## 2018-05-13 NOTE — Progress Notes (Signed)
HPI Mrs. Troost returns today for ongoing evaluation and treatment of atrial fib. She is an 85 yo woman with copd, chronic systolic and diastolic heart failure, CAD, s/p PCI, who has had atrial fib for several months. She was placed on amiodarone. She underewent DCCV in October. She has had some problems with nose bleeds. Over the past week, she has had recurrent chest pressure and sob, with exertion and at other times at rest and relieved by slntg. She has palpitations but none sustained.  Allergies  Allergen Reactions  . Cephalosporins Diarrhea and Nausea Only    Lightheaded  . Levaquin [Levofloxacin In D5w] Swelling  . Macrodantin [Nitrofurantoin Macrocrystal] Swelling  . Phenothiazines Anaphylaxis and Hives  . Polysorbate Anaphylaxis  . Prednisone Shortness Of Breath  . Buspirone Itching  . Cardura [Doxazosin Mesylate] Itching  . Codeine Itching  . Acyclovir And Related Itching    Redness of skin  . Prochlorperazine Other (See Comments)    "Upset stomach"  . Ranexa [Ranolazine]     Severe drop in BP  . Atorvastatin Hives    Cramping; tolerates Crestor ok  . Ofloxacin Rash  . Other Itching and Rash    "WOOL"= make skin look like it has been burned  . Penicillins Other (See Comments)    Causes redness all over. Has patient had a PCN reaction causing immediate rash, facial/tongue/throat swelling, SOB or lightheadedness with hypotension: No Has patient had a PCN reaction causing severe rash involving mucus membranes or skin necrosis: No Has patient had a PCN reaction that required hospitalization No Has patient had a PCN reaction occurring within the last 10 years: No If all of the above answers are "NO", then may proceed with Cephalosporin use.   . Pimozide Hives and Itching     Current Outpatient Medications  Medication Sig Dispense Refill  . acetaminophen (TYLENOL) 500 MG tablet Take 1,000 mg by mouth daily as needed for headache.     . ALPRAZolam (XANAX) 0.25 MG  tablet Take 0.25 mg by mouth 2 (two) times daily as needed.  0  . amiodarone (PACERONE) 200 MG tablet Take 1 tablet (200 mg total) by mouth daily. 90 tablet 3  . DULoxetine (CYMBALTA) 60 MG capsule Take 60 mg by mouth at bedtime.     [name] Kitchen ELIQUIS 5 MG TABS tablet TAKE ONE TABLET BY MOUTH TWICE DAILY. 180 tablet 3  . fluticasone (FLONASE) 50 MCG/ACT nasal spray Place 2 sprays into both nostrils 2 (two) times daily as needed for allergies.     . furosemide (LASIX) 20 MG tablet Take 20 mg by mouth daily as needed for fluid or edema.    . furosemide (LASIX) 40 MG tablet TAKE ONE TABLET BY MOUTH DAILY. 90 tablet 3  . ipratropium-albuterol (DUONEB) 0.5-2.5 (3) MG/3ML SOLN Take 3 mLs by nebulization every 6 (six) hours. 360 mL 0  . isosorbide mononitrate (IMDUR) 60 MG 24 hr tablet Take 1 tablet (60 mg total) by mouth daily. 90 tablet 3  . magnesium oxide (MAG-OX) 400 MG tablet TAKE ONE TABLET BY MOUTH DAILY. 90 tablet 3  . methocarbamol (ROBAXIN) 500 MG tablet Take 500 mg by mouth every 6 (six) hours as needed for muscle spasms.     . Multiple Vitamin (MULTIVITAMIN WITH MINERALS) TABS tablet Take 1 tablet by mouth every other day. Centrum     . nitroGLYCERIN (NITROSTAT) 0.4 MG SL tablet Place 1 tablet (0.4 mg total) under the tongue every 5 (five) minutes  as needed for chest pain. Reported on 08/04/2015 25 tablet 6  . pantoprazole (PROTONIX) 40 MG tablet Take 40 mg by mouth daily.     . potassium chloride (K-DUR,KLOR-CON) 10 MEQ tablet TAKE TWO (2) TABLETS BY MOUTH DAILY. 180 tablet 3  . lisinopril (PRINIVIL,ZESTRIL) 2.5 MG tablet Take 1 tablet (2.5 mg total) by mouth daily. 90 tablet 3  . metoprolol succinate (TOPROL-XL) 50 MG 24 hr tablet Take 1 tablet (50 mg total) by mouth daily. Take with or immediately following a meal. 90 tablet 3   No current facility-administered medications for this visit.      Past Medical History:  Diagnosis Date  . Anxiety   . Arthritis   . Atrial fibrillation ([place])   .  Bursitis    Left shoulder  . Cataract   . CHF (congestive heart failure) ([place])   . COPD (chronic obstructive pulmonary disease) ([place])   . Coronary atherosclerosis of native coronary artery    a. s/p DES to LCx in 04/2013 b. cath in 11/2015 showing patent stent with 20% prox-LAD and 80% ostial RCA stenosis for which medical management was recommended due to small artery size  . Depression   . Diastolic heart failure (HCC)    EF 55-60%  . Dysphagia, unspecified(787.20)   . Dyspnea   . Dysrhythmia   . Essential hypertension   . GERD (gastroesophageal reflux disease)    Hx Schatzki's ring, multiple EGD/ED last 01/06/2004  . Gout   . Headache   . History of anemia   . Hyperlipidemia   . Internal hemorrhoids without mention of complication   . MI (myocardial infarction) ([place]) 2006  . Microscopic colitis 2003  . Panic disorder without agoraphobia   . Paresthesia   . Pneumonia 12/2011  . PVD (peripheral vascular disease) ([place])   . S/P colonoscopy 09/27/2001   internal hemorrhoids, desc colon inflam polyp, SB BX-chronic duodenitis, colitis  . Thyroid disease     ROS:   All systems reviewed and negative except as noted in the HPI.   Past Surgical History:  Procedure Laterality Date  . ABDOMINAL HYSTERECTOMY    . ABDOMINAL HYSTERECTOMY    . ANTERIOR AND POSTERIOR REPAIR     with resection of vagina  . ANTERIOR LAT LUMBAR FUSION N/A 08/01/2016   Procedure: Lumbar Two-Lumbar Five Transpsoas lateral interbody fusion with Lumbar Two-Three lateral plate fixation;  Surgeon: Kevan Ny Ditty, MD;  Location: [place];  Service: Neurosurgery;  Laterality: N/A;  L2-5 Transpsoas lateral interbody fusion with L2-3 lateral plate fixation  . APPENDECTOMY    . BACK SURGERY    . BIOPSY  07/05/2015   Procedure: BIOPSY;  Surgeon: Daneil Dolin, MD;  [facility];  Service: Endoscopy;;  gastric polyp biopsy, ascending colon biopsy  . BLADDER SUSPENSION  11/09/2011   Procedure: TRANSVAGINAL  TAPE (TVT) PROCEDURE;  Surgeon: Marissa Nestle, MD;  Location: [facility];  Service: Urology;  Laterality: N/A;  . bladder tack  06/2010  . BREAST LUMPECTOMY  1998   left, benign  . CARDIAC CATHETERIZATION    . CARDIAC CATHETERIZATION    . CARDIAC CATHETERIZATION N/A 12/16/2015   Procedure: Left Heart Cath and Coronary Angiography;  Surgeon: Troy Sine, MD;  Location: [facility] [facility];  Service: Cardiovascular;  Laterality: N/A;  . CARDIOVERSION N/A 10/04/2017   Procedure: CARDIOVERSION;  Surgeon: Herminio Commons, MD;  Location: [facility];  Service: Cardiovascular;  Laterality: N/A;  . CARDIOVERSION N/A 01/30/2018  Procedure: CARDIOVERSION;  Surgeon: Herminio Commons, MD;  [facility];  Service: Cardiovascular;  Laterality: N/A;  . [place]   left  . cataract surgery    . CHOLECYSTECTOMY  1998  . Cholecystectomy    . COLONOSCOPY  03/16/2011   multiple hyperplastic colon polyps, sigmoid diverticulosis, melanosis coli  . COLONOSCOPY WITH PROPOFOL N/A 07/05/2015   RMR:one 5 mm polyp in descending colon  . CORONARY ANGIOPLASTY WITH STENT PLACEMENT    . ESOPHAGEAL DILATION N/A 07/05/2015   Procedure: ESOPHAGEAL DILATION;  Surgeon: Daneil Dolin, MD;  [facility];  Service: Endoscopy;  Laterality: N/A;  . ESOPHAGOGASTRODUODENOSCOPY (EGD) WITH PROPOFOL N/A 07/05/2015   VQM:[id number]  . JOINT REPLACEMENT Right 2007   right knee  . left hand surgery    . LEFT HEART CATHETERIZATION WITH CORONARY ANGIOGRAM N/A 05/14/2013   Procedure: LEFT HEART CATHETERIZATION WITH CORONARY ANGIOGRAM;  Surgeon: Blane Ohara, MD;  Location: [facility] CATH LAB;  Service: Cardiovascular;  Laterality: N/A;  . left rotator cuff surgery    . LUMBAR LAMINECTOMY/DECOMPRESSION MICRODISCECTOMY N/A 10/11/2012   Procedure: LUMBAR LAMINECTOMY/DECOMPRESSION MICRODISCECTOMY 2 LEVELS;  Surgeon: Floyce Stakes, MD;  Location: [place] [facility];  Service: Neurosurgery;  Laterality: N/A;   L3-4 L4-5 Laminectomy  . LUMBAR WOUND DEBRIDEMENT N/A 09/27/2015   Procedure: Exploration of Lumbar Wound w/ Repair CSF Leak/Lumbar Drain Placement;  Surgeon: Leeroy Cha, MD;  Location: [place] [facility];  Service: Neurosurgery;  Laterality: N/A;  . MALONEY DILATION  03/16/2011   Gastritis. No H.pylori on bx. 36F maloney dilation with disruption of  occult cevical esophageal web  . NASAL SINUS SURGERY    . right knee replacement  2007  . right leg benign tumor    . SHOULDER SURGERY Left   . TEE WITHOUT CARDIOVERSION N/A 10/04/2017   Procedure: TRANSESOPHAGEAL ECHOCARDIOGRAM (TEE) WITH PROPOFOL;  Surgeon: Herminio Commons, MD;  [facility];  Service: Cardiovascular;  Laterality: N/A;  . TONSILLECTOMY    . unspecified area, hysterectomy  1972   partial     Family History  Problem Relation Age of Onset  . Stroke Mother   . Parkinson's disease Father   . Coronary artery disease Other        family Hx-sons  . Cancer Other   . Stroke Other        family Hx  . Hypertension Other        family Hx  . Diabetes Brother   . Heart disease Son        before age 4  . Diabetes Son   . Stroke Daughter 29  . Colon cancer Neg Hx      Social History   Socioeconomic History  . Marital status: Divorced    Spouse name: Not on file  . Number of children: 5  . Years of education: Not on file  . Highest education level: Not on file  Occupational History  . Occupation: retired  Scientific laboratory technician  . Financial resource strain: Not on file  . Food insecurity:    Worry: Not on file    Inability: Not on file  . Transportation needs:    Medical: Not on file    Non-medical: Not on file  Tobacco Use  . Smoking status: Former Smoker    Packs/day: 1.00    Years: 64.00    Pack years: 64.00    Types: Cigarettes    Start date: 12/24/1947  Last attempt to quit: 11/17/2001    Years since quitting: 16.4  . Smokeless tobacco: Never Used  . Tobacco comment: Quit smoking in 2003  Substance and  Sexual Activity  . Alcohol use: No    Alcohol/week: 0.0 standard drinks  . Drug use: No  . Sexual activity: Never  Lifestyle  . Physical activity:    Days per week: Not on file    Minutes per session: Not on file  . Stress: Not on file  Relationships  . Social connections:    Talks on phone: Not on file    Gets together: Not on file    Attends religious service: Not on file    Active member of club or organization: Not on file    Attends meetings of clubs or organizations: Not on file    Relationship status: Not on file  . Intimate partner violence:    Fear of current or ex partner: Not on file    Emotionally abused: Not on file    Physically abused: Not on file    Forced sexual activity: Not on file  Other Topics Concern  . Not on file  Social History Narrative   Divorced.   Sister had colon perforation & died from complications in [place], [place]     BP 138/60   Pulse 79   Wt 164 lb (74.4 kg)   SpO2 94%   BMI 30.99 kg/m   Physical Exam:  Well appearing NAD HEENT: Unremarkable Neck:  No JVD, no thyromegally Lymphatics:  No adenopathy Back:  No CVA tenderness Lungs:  Clear with no wheezes HEART:  Regular rate rhythm, no murmurs, no rubs, no clicks Abd:  soft, positive bowel sounds, no organomegally, no rebound, no guarding Ext:  2 plus pulses, no edema, no cyanosis, no clubbing Skin:  No rashes no nodules Neuro:  CN II through XII intact, motor grossly intact  EKG - nsr with nsstt changes.   Assess/Plan: 1. Crescendo angina - she has had a worsening of this and her symptoms sound worrisome. She is on good medical therapy. I am concerned that her stent is restenosed. I will have her go to [facility] for Left heart cath. She will hold Eliquis.  2. PAF - she is maintaining NSR on amiodarone. She will continue 200 mg daily. 3. Dyslipidemia - she is unfortunately intolerant to statin therapy.  4. HTN - her SBP is elevated mildly. She is encouraged to maintain a low sodium  diet.  Mikle Bosworth.D.

## 2018-05-13 NOTE — H&P (View-Only) (Signed)
HPI [name] returns today for ongoing evaluation and treatment of atrial fib. She is an 85 yo woman with copd, chronic systolic and diastolic heart failure, CAD, s/p PCI, who has had atrial fib for several months. She was placed on amiodarone. She underewent DCCV in October. She has had some problems with nose bleeds. Over the past week, she has had recurrent chest pressure and sob, with exertion and at other times at rest and relieved by slntg. She has palpitations but none sustained.  Allergies  Allergen Reactions  . Cephalosporins Diarrhea and Nausea Only    Lightheaded  . Levaquin [Levofloxacin In D5w] Swelling  . Macrodantin [Nitrofurantoin Macrocrystal] Swelling  . Phenothiazines Anaphylaxis and Hives  . Polysorbate Anaphylaxis  . Prednisone Shortness Of Breath  . Buspirone Itching  . Cardura [Doxazosin Mesylate] Itching  . Codeine Itching  . Acyclovir And Related Itching    Redness of skin  . Prochlorperazine Other (See Comments)    "Upset stomach"  . Ranexa [Ranolazine]     Severe drop in BP  . Atorvastatin Hives    Cramping; tolerates Crestor ok  . Ofloxacin Rash  . Other Itching and Rash    "WOOL"= make skin look like it has been burned  . Penicillins Other (See Comments)    Causes redness all over. Has patient had a PCN reaction causing immediate rash, facial/tongue/throat swelling, SOB or lightheadedness with hypotension: No Has patient had a PCN reaction causing severe rash involving mucus membranes or skin necrosis: No Has patient had a PCN reaction that required hospitalization No Has patient had a PCN reaction occurring within the last 10 years: No If all of the above answers are "NO", then may proceed with Cephalosporin use.   . Pimozide Hives and Itching     Current Outpatient Medications  Medication Sig Dispense Refill  . acetaminophen (TYLENOL) 500 MG tablet Take 1,000 mg by mouth daily as needed for headache.     . ALPRAZolam (XANAX) 0.25 MG  tablet Take 0.25 mg by mouth 2 (two) times daily as needed.  0  . amiodarone (PACERONE) 200 MG tablet Take 1 tablet (200 mg total) by mouth daily. 90 tablet 3  . DULoxetine (CYMBALTA) 60 MG capsule Take 60 mg by mouth at bedtime.     [name] Kitchen ELIQUIS 5 MG TABS tablet TAKE ONE TABLET BY MOUTH TWICE DAILY. 180 tablet 3  . fluticasone (FLONASE) 50 MCG/ACT nasal spray Place 2 sprays into both nostrils 2 (two) times daily as needed for allergies.     . furosemide (LASIX) 20 MG tablet Take 20 mg by mouth daily as needed for fluid or edema.    . furosemide (LASIX) 40 MG tablet TAKE ONE TABLET BY MOUTH DAILY. 90 tablet 3  . ipratropium-albuterol (DUONEB) 0.5-2.5 (3) MG/3ML SOLN Take 3 mLs by nebulization every 6 (six) hours. 360 mL 0  . isosorbide mononitrate (IMDUR) 60 MG 24 hr tablet Take 1 tablet (60 mg total) by mouth daily. 90 tablet 3  . magnesium oxide (MAG-OX) 400 MG tablet TAKE ONE TABLET BY MOUTH DAILY. 90 tablet 3  . methocarbamol (ROBAXIN) 500 MG tablet Take 500 mg by mouth every 6 (six) hours as needed for muscle spasms.     . Multiple Vitamin (MULTIVITAMIN WITH MINERALS) TABS tablet Take 1 tablet by mouth every other day. Centrum     . nitroGLYCERIN (NITROSTAT) 0.4 MG SL tablet Place 1 tablet (0.4 mg total) under the tongue every 5 (five) minutes  as needed for chest pain. Reported on 08/04/2015 25 tablet 6  . pantoprazole (PROTONIX) 40 MG tablet Take 40 mg by mouth daily.     . potassium chloride (K-DUR,KLOR-CON) 10 MEQ tablet TAKE TWO (2) TABLETS BY MOUTH DAILY. 180 tablet 3  . lisinopril (PRINIVIL,ZESTRIL) 2.5 MG tablet Take 1 tablet (2.5 mg total) by mouth daily. 90 tablet 3  . metoprolol succinate (TOPROL-XL) 50 MG 24 hr tablet Take 1 tablet (50 mg total) by mouth daily. Take with or immediately following a meal. 90 tablet 3   No current facility-administered medications for this visit.      Past Medical History:  Diagnosis Date  . Anxiety   . Arthritis   . Atrial fibrillation ([place])   .  Bursitis    Left shoulder  . Cataract   . CHF (congestive heart failure) ([place])   . COPD (chronic obstructive pulmonary disease) ([place])   . Coronary atherosclerosis of native coronary artery    a. s/p DES to LCx in 04/2013 b. cath in 11/2015 showing patent stent with 20% prox-LAD and 80% ostial RCA stenosis for which medical management was recommended due to small artery size  . Depression   . Diastolic heart failure (HCC)    EF 55-60%  . Dysphagia, unspecified(787.20)   . Dyspnea   . Dysrhythmia   . Essential hypertension   . GERD (gastroesophageal reflux disease)    Hx Schatzki's ring, multiple EGD/ED last 01/06/2004  . Gout   . Headache   . History of anemia   . Hyperlipidemia   . Internal hemorrhoids without mention of complication   . MI (myocardial infarction) ([place]) 2006  . Microscopic colitis 2003  . Panic disorder without agoraphobia   . Paresthesia   . Pneumonia 12/2011  . PVD (peripheral vascular disease) ([place])   . S/P colonoscopy 09/27/2001   internal hemorrhoids, desc colon inflam polyp, SB BX-chronic duodenitis, colitis  . Thyroid disease     ROS:   All systems reviewed and negative except as noted in the HPI.   Past Surgical History:  Procedure Laterality Date  . ABDOMINAL HYSTERECTOMY    . ABDOMINAL HYSTERECTOMY    . ANTERIOR AND POSTERIOR REPAIR     with resection of vagina  . ANTERIOR LAT LUMBAR FUSION N/A 08/01/2016   Procedure: Lumbar Two-Lumbar Five Transpsoas lateral interbody fusion with Lumbar Two-Three lateral plate fixation;  Surgeon: Kevan Ny Ditty, MD;  Location: [place];  Service: Neurosurgery;  Laterality: N/A;  L2-5 Transpsoas lateral interbody fusion with L2-3 lateral plate fixation  . APPENDECTOMY    . BACK SURGERY    . BIOPSY  07/05/2015   Procedure: BIOPSY;  Surgeon: Daneil Dolin, MD;  [facility];  Service: Endoscopy;;  gastric polyp biopsy, ascending colon biopsy  . BLADDER SUSPENSION  11/09/2011   Procedure: TRANSVAGINAL  TAPE (TVT) PROCEDURE;  Surgeon: Marissa Nestle, MD;  [facility];  Service: Urology;  Laterality: N/A;  . bladder tack  06/2010  . BREAST LUMPECTOMY  1998   left, benign  . CARDIAC CATHETERIZATION    . CARDIAC CATHETERIZATION    . CARDIAC CATHETERIZATION N/A 12/16/2015   Procedure: Left Heart Cath and Coronary Angiography;  Surgeon: Troy Sine, MD;  Location: [place] [facility] LAB;  Service: Cardiovascular;  Laterality: N/A;  . CARDIOVERSION N/A 10/04/2017   Procedure: CARDIOVERSION;  Surgeon: Herminio Commons, MD;  [facility];  Service: Cardiovascular;  Laterality: N/A;  . CARDIOVERSION N/A 01/30/2018  Procedure: CARDIOVERSION;  Surgeon: Herminio Commons, MD;  [facility];  Service: Cardiovascular;  Laterality: N/A;  . [place]   left  . cataract surgery    . CHOLECYSTECTOMY  1998  . Cholecystectomy    . COLONOSCOPY  03/16/2011   multiple hyperplastic colon polyps, sigmoid diverticulosis, melanosis coli  . COLONOSCOPY WITH PROPOFOL N/A 07/05/2015   RMR:one 5 mm polyp in descending colon  . CORONARY ANGIOPLASTY WITH STENT PLACEMENT    . ESOPHAGEAL DILATION N/A 07/05/2015   Procedure: ESOPHAGEAL DILATION;  Surgeon: Daneil Dolin, MD;  [facility];  Service: Endoscopy;  Laterality: N/A;  . ESOPHAGOGASTRODUODENOSCOPY (EGD) WITH PROPOFOL N/A 07/05/2015   [id number]  . JOINT REPLACEMENT Right 2007   right knee  . left hand surgery    . LEFT HEART CATHETERIZATION WITH CORONARY ANGIOGRAM N/A 05/14/2013   Procedure: LEFT HEART CATHETERIZATION WITH CORONARY ANGIOGRAM;  Surgeon: Blane Ohara, MD;  Location: [facility] CATH LAB;  Service: Cardiovascular;  Laterality: N/A;  . left rotator cuff surgery    . LUMBAR LAMINECTOMY/DECOMPRESSION MICRODISCECTOMY N/A 10/11/2012   Procedure: LUMBAR LAMINECTOMY/DECOMPRESSION MICRODISCECTOMY 2 LEVELS;  Surgeon: Floyce Stakes, MD;  Location: [place] [facility];  Service: Neurosurgery;  Laterality: N/A;   L3-4 L4-5 Laminectomy  . LUMBAR WOUND DEBRIDEMENT N/A 09/27/2015   Procedure: Exploration of Lumbar Wound w/ Repair CSF Leak/Lumbar Drain Placement;  Surgeon: Leeroy Cha, MD;  Location: [place] [facility];  Service: Neurosurgery;  Laterality: N/A;  . MALONEY DILATION  03/16/2011   Gastritis. No H.pylori on bx. 68F maloney dilation with disruption of  occult cevical esophageal web  . NASAL SINUS SURGERY    . right knee replacement  2007  . right leg benign tumor    . SHOULDER SURGERY Left   . TEE WITHOUT CARDIOVERSION N/A 10/04/2017   Procedure: TRANSESOPHAGEAL ECHOCARDIOGRAM (TEE) WITH PROPOFOL;  Surgeon: Herminio Commons, MD;  [facility];  Service: Cardiovascular;  Laterality: N/A;  . TONSILLECTOMY    . unspecified area, hysterectomy  1972   partial     Family History  Problem Relation Age of Onset  . Stroke Mother   . Parkinson's disease Father   . Coronary artery disease Other        family Hx-sons  . Cancer Other   . Stroke Other        family Hx  . Hypertension Other        family Hx  . Diabetes Brother   . Heart disease Son        before age 69  . Diabetes Son   . Stroke Daughter 48  . Colon cancer Neg Hx      Social History   Socioeconomic History  . Marital status: Divorced    Spouse name: Not on file  . Number of children: 5  . Years of education: Not on file  . Highest education level: Not on file  Occupational History  . Occupation: retired  Scientific laboratory technician  . Financial resource strain: Not on file  . Food insecurity:    Worry: Not on file    Inability: Not on file  . Transportation needs:    Medical: Not on file    Non-medical: Not on file  Tobacco Use  . Smoking status: Former Smoker    Packs/day: 1.00    Years: 64.00    Pack years: 64.00    Types: Cigarettes    Start date: 12/24/1947  Last attempt to quit: 11/17/2001    Years since quitting: 16.4  . Smokeless tobacco: Never Used  . Tobacco comment: Quit smoking in 2003  Substance and  Sexual Activity  . Alcohol use: No    Alcohol/week: 0.0 standard drinks  . Drug use: No  . Sexual activity: Never  Lifestyle  . Physical activity:    Days per week: Not on file    Minutes per session: Not on file  . Stress: Not on file  Relationships  . Social connections:    Talks on phone: Not on file    Gets together: Not on file    Attends religious service: Not on file    Active member of club or organization: Not on file    Attends meetings of clubs or organizations: Not on file    Relationship status: Not on file  . Intimate partner violence:    Fear of current or ex partner: Not on file    Emotionally abused: Not on file    Physically abused: Not on file    Forced sexual activity: Not on file  Other Topics Concern  . Not on file  Social History Narrative   Divorced.   Sister had colon perforation & died from complications in [place], [place]     BP 138/60   Pulse 79   Wt 164 lb (74.4 kg)   SpO2 94%   BMI 30.99 kg/m   Physical Exam:  Well appearing NAD HEENT: Unremarkable Neck:  No JVD, no thyromegally Lymphatics:  No adenopathy Back:  No CVA tenderness Lungs:  Clear with no wheezes HEART:  Regular rate rhythm, no murmurs, no rubs, no clicks Abd:  soft, positive bowel sounds, no organomegally, no rebound, no guarding Ext:  2 plus pulses, no edema, no cyanosis, no clubbing Skin:  No rashes no nodules Neuro:  CN II through XII intact, motor grossly intact  EKG - nsr with nsstt changes.   Assess/Plan: 1. Crescendo angina - she has had a worsening of this and her symptoms sound worrisome. She is on good medical therapy. I am concerned that her stent is restenosed. I will have her go to [facility] for [facility] heart cath. She will hold Eliquis.  2. PAF - she is maintaining NSR on amiodarone. She will continue 200 mg daily. 3. Dyslipidemia - she is unfortunately intolerant to statin therapy.  4. HTN - her SBP is elevated mildly. She is encouraged to maintain a low sodium  diet.  Mikle Bosworth.D.

## 2018-05-14 ENCOUNTER — Telehealth: Payer: Self-pay | Admitting: *Deleted

## 2018-05-14 ENCOUNTER — Telehealth: Payer: Self-pay

## 2018-05-14 NOTE — Telephone Encounter (Signed)
Pt contacted pre-catheterization scheduled at [facility] for: Thursday May 16, 2018 12 noon Verified arrival time and place: [place] Entrance A at: 7 AM-for pre procedure hydration-pt aware.  No solid food after midnight prior to cath, clear liquids until 5 AM day of procedure. Contrast allergy: no   Hold: Eliquis-last dose AM 05/13/18 until post procedure. Lasix-day before and day of procedure. KCl-day before and day of procedure. Lisinopril -day before and day of procedure.   Except hold medications AM meds can be  taken pre-cath with sip of water including: ASA 81 mg  Confirmed patient has responsible person to drive home post procedure and for 24 hours after you arrive home: yes  Plan for pre procedure hydration,  medication hold instructions, weight based IV flow rate for hydration have been reviewed with Dr Lovena Le.

## 2018-05-14 NOTE — Telephone Encounter (Signed)
-----   Message from Damian Leavell, RN sent at 05/14/2018 11:29 AM EST ----- Please call pt

## 2018-05-14 NOTE — Telephone Encounter (Signed)
Patient told me [name] to Hold Lasix tomorrow and Thursday.She will also have 4 hrs IV hydration before cath.Patient states she has always taken Lasix 40 mg.She said occasionally with weight gain, she took an extra 20 mg Lasix

## 2018-05-16 ENCOUNTER — Other Ambulatory Visit: Payer: Self-pay

## 2018-05-16 ENCOUNTER — Ambulatory Visit (HOSPITAL_COMMUNITY)
Admission: RE | Admit: 2018-05-16 | Discharge: 2018-05-16 | Disposition: A | Discharge status: Home / Self Care | Payer: Medicare Other | Source: Ambulatory Visit | Attending: Interventional Cardiology | Admitting: Interventional Cardiology

## 2018-05-16 ENCOUNTER — Encounter (HOSPITAL_COMMUNITY)
Admission: RE | Disposition: A | Discharge status: Home / Self Care | Payer: Self-pay | Source: Ambulatory Visit | Attending: Interventional Cardiology

## 2018-05-16 DIAGNOSIS — M1990 Unspecified osteoarthritis, unspecified site: Secondary | ICD-10-CM | POA: Diagnosis not present

## 2018-05-16 DIAGNOSIS — I779 Disorder of arteries and arterioles, unspecified: Secondary | ICD-10-CM | POA: Diagnosis present

## 2018-05-16 DIAGNOSIS — I4819 Other persistent atrial fibrillation: Secondary | ICD-10-CM | POA: Diagnosis present

## 2018-05-16 DIAGNOSIS — I4891 Unspecified atrial fibrillation: Secondary | ICD-10-CM | POA: Diagnosis not present

## 2018-05-16 DIAGNOSIS — I739 Peripheral vascular disease, unspecified: Secondary | ICD-10-CM | POA: Diagnosis not present

## 2018-05-16 DIAGNOSIS — Z87891 Personal history of nicotine dependence: Secondary | ICD-10-CM | POA: Insufficient documentation

## 2018-05-16 DIAGNOSIS — Z823 Family history of stroke: Secondary | ICD-10-CM | POA: Diagnosis not present

## 2018-05-16 DIAGNOSIS — R0609 Other forms of dyspnea: Secondary | ICD-10-CM | POA: Diagnosis present

## 2018-05-16 DIAGNOSIS — Z90711 Acquired absence of uterus with remaining cervical stump: Secondary | ICD-10-CM | POA: Insufficient documentation

## 2018-05-16 DIAGNOSIS — I110 Hypertensive heart disease with heart failure: Secondary | ICD-10-CM | POA: Insufficient documentation

## 2018-05-16 DIAGNOSIS — E079 Disorder of thyroid, unspecified: Secondary | ICD-10-CM | POA: Diagnosis not present

## 2018-05-16 DIAGNOSIS — R0789 Other chest pain: Secondary | ICD-10-CM | POA: Diagnosis present

## 2018-05-16 DIAGNOSIS — J449 Chronic obstructive pulmonary disease, unspecified: Secondary | ICD-10-CM | POA: Insufficient documentation

## 2018-05-16 DIAGNOSIS — I2510 Atherosclerotic heart disease of native coronary artery without angina pectoris: Secondary | ICD-10-CM | POA: Diagnosis present

## 2018-05-16 DIAGNOSIS — K219 Gastro-esophageal reflux disease without esophagitis: Secondary | ICD-10-CM | POA: Insufficient documentation

## 2018-05-16 DIAGNOSIS — M109 Gout, unspecified: Secondary | ICD-10-CM | POA: Insufficient documentation

## 2018-05-16 DIAGNOSIS — I5043 Acute on chronic combined systolic (congestive) and diastolic (congestive) heart failure: Secondary | ICD-10-CM | POA: Diagnosis present

## 2018-05-16 DIAGNOSIS — I5042 Chronic combined systolic (congestive) and diastolic (congestive) heart failure: Secondary | ICD-10-CM | POA: Diagnosis not present

## 2018-05-16 DIAGNOSIS — E785 Hyperlipidemia, unspecified: Secondary | ICD-10-CM | POA: Insufficient documentation

## 2018-05-16 DIAGNOSIS — Z8249 Family history of ischemic heart disease and other diseases of the circulatory system: Secondary | ICD-10-CM | POA: Insufficient documentation

## 2018-05-16 DIAGNOSIS — Z955 Presence of coronary angioplasty implant and graft: Secondary | ICD-10-CM | POA: Insufficient documentation

## 2018-05-16 DIAGNOSIS — I252 Old myocardial infarction: Secondary | ICD-10-CM | POA: Insufficient documentation

## 2018-05-16 DIAGNOSIS — I10 Essential (primary) hypertension: Secondary | ICD-10-CM | POA: Diagnosis present

## 2018-05-16 HISTORY — PX: CORONARY ANGIOGRAPHY: CATH118303

## 2018-05-16 LAB — BASIC METABOLIC PANEL
Anion gap: 8 (ref 5–15)
BUN: 21 mg/dL (ref 8–23)
CO2: 23 mmol/L (ref 22–32)
Calcium: 8.6 mg/dL — ABNORMAL LOW (ref 8.9–10.3)
Chloride: 107 mmol/L (ref 98–111)
Creatinine, Ser: 1.58 mg/dL — ABNORMAL HIGH (ref 0.44–1.00)
GFR calc Af Amer: 35 mL/min — ABNORMAL LOW (ref >60)
GFR calc non Af Amer: 30 mL/min — ABNORMAL LOW (ref >60)
Glucose, Bld: 115 mg/dL — ABNORMAL HIGH (ref 70–99)
Potassium: 3.6 mmol/L (ref 3.5–5.1)
Sodium: 138 mmol/L (ref 135–145)

## 2018-05-16 SURGERY — CORONARY ANGIOGRAPHY (CATH LAB)
Anesthesia: LOCAL

## 2018-05-16 MED ORDER — IOHEXOL 350 MG/ML SOLN
INTRAVENOUS | Status: DC | PRN
  Administered 2018-05-16: 75 mL via INTRA_ARTERIAL

## 2018-05-16 MED ORDER — FENTANYL CITRATE (PF) 100 MCG/2ML IJ SOLN
INTRAMUSCULAR | Status: DC | PRN
  Administered 2018-05-16: 25 ug via INTRAVENOUS

## 2018-05-16 MED ORDER — APIXABAN 5 MG PO TABS: 5.0000 mg | ORAL_TABLET | Freq: Two times a day (BID) | ORAL | 3 refills | Status: DC

## 2018-05-16 MED ORDER — SODIUM CHLORIDE 0.9 % WEIGHT BASED INFUSION: 1.0000 mL/kg/h | INTRAVENOUS | Status: DC

## 2018-05-16 MED ORDER — ONDANSETRON HCL 4 MG/2ML IJ SOLN: 4.0000 mg | Freq: Four times a day (QID) | INTRAMUSCULAR | Status: DC | PRN

## 2018-05-16 MED ORDER — SODIUM CHLORIDE 0.9 % IV SOLN: 250.0000 mL | INTRAVENOUS | Status: DC | PRN

## 2018-05-16 MED ORDER — SODIUM CHLORIDE 0.9% FLUSH: 3.0000 mL | INTRAVENOUS | Status: DC | PRN

## 2018-05-16 MED ORDER — HEPARIN (PORCINE) IN NACL 1000-0.9 UT/500ML-% IV SOLN
INTRAVENOUS | Status: DC | PRN
  Administered 2018-05-16: 500 mL

## 2018-05-16 MED ORDER — HEPARIN (PORCINE) IN NACL 1000-0.9 UT/500ML-% IV SOLN
INTRAVENOUS | Status: AC
  Filled 2018-05-16: qty 1000

## 2018-05-16 MED ORDER — LIDOCAINE HCL (PF) 1 % IJ SOLN
INTRAMUSCULAR | Status: DC | PRN
  Administered 2018-05-16: 30 mL

## 2018-05-16 MED ORDER — ASPIRIN 81 MG PO CHEW: 81.0000 mg | CHEWABLE_TABLET | ORAL | Status: DC

## 2018-05-16 MED ORDER — MIDAZOLAM HCL 2 MG/2ML IJ SOLN
INTRAMUSCULAR | Status: AC
  Filled 2018-05-16: qty 2

## 2018-05-16 MED ORDER — ACETAMINOPHEN 325 MG PO TABS: 650.0000 mg | ORAL_TABLET | ORAL | Status: DC | PRN

## 2018-05-16 MED ORDER — SODIUM CHLORIDE 0.9% FLUSH: 3.0000 mL | Freq: Two times a day (BID) | INTRAVENOUS | Status: DC

## 2018-05-16 MED ORDER — FENTANYL CITRATE (PF) 100 MCG/2ML IJ SOLN
INTRAMUSCULAR | Status: AC
  Filled 2018-05-16: qty 2

## 2018-05-16 MED ORDER — SODIUM CHLORIDE 0.9 % WEIGHT BASED INFUSION
3.0000 mL/kg/h | INTRAVENOUS | Status: AC
  Administered 2018-05-16: 3 mL/kg/h via INTRAVENOUS

## 2018-05-16 MED ORDER — VERAPAMIL HCL 2.5 MG/ML IV SOLN
INTRAVENOUS | Status: AC
  Filled 2018-05-16: qty 2

## 2018-05-16 MED ORDER — MIDAZOLAM HCL 2 MG/2ML IJ SOLN
INTRAMUSCULAR | Status: DC | PRN
  Administered 2018-05-16: 0.5 mg via INTRAVENOUS

## 2018-05-16 MED ORDER — LIDOCAINE HCL (PF) 1 % IJ SOLN
INTRAMUSCULAR | Status: AC
  Filled 2018-05-16: qty 30

## 2018-05-16 MED ORDER — SODIUM CHLORIDE 0.9 % IV SOLN: INTRAVENOUS | Status: DC

## 2018-05-16 SURGICAL SUPPLY — 9 items
CATH INFINITI 5FR MULTPACK ANG (CATHETERS) ×1 IMPLANT
CLOSURE MYNX CONTROL 5F (Vascular Products) ×1 IMPLANT
KIT HEART LEFT (KITS) ×2 IMPLANT
PACK CARDIAC CATHETERIZATION (CUSTOM PROCEDURE TRAY) ×2 IMPLANT
SHEATH PINNACLE 5F 10CM (SHEATH) ×1 IMPLANT
SHEATH PROBE COVER 6X72 (BAG) ×1 IMPLANT
TRANSDUCER W/STOPCOCK (MISCELLANEOUS) ×2 IMPLANT
TUBING CIL FLEX 10 FLL-RA (TUBING) ×2 IMPLANT
WIRE EMERALD 3MM-J .035X150CM (WIRE) ×1 IMPLANT

## 2018-05-16 NOTE — Progress Notes (Signed)
No bleeding or hematoma noted after ambulation 

## 2018-05-16 NOTE — Discharge Instructions (Signed)
Femoral Site Care °This sheet gives you information about how to care for yourself after your procedure. Your health care provider may also give you more specific instructions. If you have problems or questions, contact your health care provider. °What can I expect after the procedure? °After the procedure, it is common to have: °· Bruising that usually fades within 1-2 weeks. °· Tenderness at the site. °Follow these instructions at home: °Wound care °· Follow instructions from your health care provider about how to take care of your insertion site. Make sure you: °? Wash your hands with soap and water before you change your bandage (dressing). If soap and water are not available, use hand sanitizer. °? Change your dressing as told by your health care provider. °? Leave stitches (sutures), skin glue, or adhesive strips in place. These skin closures may need to stay in place for 2 weeks or longer. If adhesive strip edges start to loosen and curl up, you may trim the loose edges. Do not remove adhesive strips completely unless your health care provider tells you to do that. °· Do not take baths, swim, or use a hot tub until your health care provider approves. °· You may shower 24-48 hours after the procedure or as told by your health care provider. °? Gently wash the site with plain soap and water. °? Pat the area dry with a clean towel. °? Do not rub the site. This may cause bleeding. °· Do not apply powder or lotion to the site. Keep the site clean and dry. °· Check your femoral site every day for signs of infection. Check for: °? Redness, swelling, or pain. °? Fluid or blood. °? Warmth. °? Pus or a bad smell. °Activity °· For the first 2-3 days after your procedure, or as long as directed: °? Avoid climbing stairs as much as possible. °? Do not squat. °· Do not lift anything that is heavier than 10 lb (4.5 kg), or the limit that you are told, until your health care provider says that it is safe. °· Rest as  directed. °? Avoid sitting for a long time without moving. Get up to take short walks every 1-2 hours. °· Do not drive for 24 hours if you were given a medicine to help you relax (sedative). °General instructions °· Take over-the-counter and prescription medicines only as told by your health care provider. °· Keep all follow-up visits as told by your health care provider. This is important. °Contact a health care provider if you have: °· A fever or chills. °· You have redness, swelling, or pain around your insertion site. °Get help right away if: °· The catheter insertion area swells very fast. °· You pass out. °· You suddenly start to sweat or your skin gets clammy. °· The catheter insertion area is bleeding, and the bleeding does not stop when you hold steady pressure on the area. °· The area near or just beyond the catheter insertion site becomes pale, cool, tingly, or numb. °These symptoms may represent a serious problem that is an emergency. Do not wait to see if the symptoms will go away. Get medical help right away. Call your local emergency services (911 in the [place] Do not drive yourself to the hospital. °Summary °· After the procedure, it is common to have bruising that usually fades within 1-2 weeks. °· Check your femoral site every day for signs of infection. °· Do not lift anything that is heavier than 10 lb (4.5 kg), or the   limit that you are told, until your health care provider says that it is safe. °This information is not intended to replace advice given to you by your health care provider. Make sure you discuss any questions you have with your health care provider. °Document Released: 12/05/2013 Document Revised: 04/16/2017 Document Reviewed: 04/16/2017 °Elsevier Interactive Patient Education © 2019 [employer] ° °

## 2018-05-16 NOTE — CV Procedure (Signed)
   Coronary angiography via right femoral approach using real-time vascular ultrasound for access.  Short left main widely patent  Ostial 30% LAD with irregularities noted in the mid vessel.  Proximal circumflex stent is widely patent with irregularities noted in the mid vessel and branches.  Ostial 99% nondominant RCA.  Unchanged from prior.  Left ventricular hemodynamics were not recorded.  LV function documented to be mildly to moderately depressed by echo in June.  Mynx used for closure.

## 2018-05-16 NOTE — Interval H&P Note (Signed)
Cath Lab Visit (complete for each Cath Lab visit)  Clinical Evaluation Leading to the Procedure:   ACS: No.  Non-ACS:    Anginal Classification: CCS III  Anti-ischemic medical therapy: Minimal Therapy (1 class of medications)  Non-Invasive Test Results: No non-invasive testing performed  Prior CABG: No previous CABG      History and Physical Interval Note:  05/16/2018 10:03 AM  [name]  has presented today for surgery, with the diagnosis of angina  The various methods of treatment have been discussed with the patient and family. After consideration of risks, benefits and other options for treatment, the patient has consented to  Procedure(s): LEFT HEART CATH AND CORONARY ANGIOGRAPHY (N/A) as a surgical intervention .  The patient's history has been reviewed, patient examined, no change in status, stable for surgery.  I have reviewed the patient's chart and labs.  Questions were answered to the patient's satisfaction.     Belva Crome III

## 2018-05-17 ENCOUNTER — Encounter (HOSPITAL_COMMUNITY): Payer: Self-pay | Admitting: Interventional Cardiology

## 2018-06-05 ENCOUNTER — Encounter: Payer: Self-pay | Admitting: Student

## 2018-06-05 ENCOUNTER — Ambulatory Visit (INDEPENDENT_AMBULATORY_CARE_PROVIDER_SITE_OTHER): Payer: Medicare Other | Admitting: Student

## 2018-06-05 ENCOUNTER — Encounter: Payer: Self-pay | Admitting: *Deleted

## 2018-06-05 VITALS — BP 132/54 | HR 60 | Ht 61.0 in | Wt 166.8 lb

## 2018-06-05 DIAGNOSIS — N183 Chronic kidney disease, stage 3 unspecified: Secondary | ICD-10-CM

## 2018-06-05 DIAGNOSIS — I25118 Atherosclerotic heart disease of native coronary artery with other forms of angina pectoris: Secondary | ICD-10-CM

## 2018-06-05 DIAGNOSIS — I10 Essential (primary) hypertension: Secondary | ICD-10-CM

## 2018-06-05 DIAGNOSIS — I480 Paroxysmal atrial fibrillation: Secondary | ICD-10-CM

## 2018-06-05 DIAGNOSIS — I5042 Chronic combined systolic (congestive) and diastolic (congestive) heart failure: Secondary | ICD-10-CM

## 2018-06-05 DIAGNOSIS — E785 Hyperlipidemia, unspecified: Secondary | ICD-10-CM | POA: Diagnosis not present

## 2018-06-05 NOTE — Progress Notes (Signed)
Cardiology Office Note    Date:  06/05/2018   ID:  [name], DOB [birth year], MRN [id number]  PCP:  Rosalee Kaufman, PA-C  Cardiologist: Kate Sable, MD   EP: Dr. Lovena Le  Chief Complaint  Patient presents with  . Follow-up    s/p cardiac catheterization    History of Present Illness:    [name] is a 85 y.o. female with past medical history of CAD (s/p stenting of LCx in 04/2013, cath in 2017 showing patent stent with 80% stenosis along small nondominant RCA), chronic combined systolic and diastolic CHF, persistent atrial fibrillation (s/p prior DCCV and on Amiodarone), HTN, HLD, and Stage 3 CKD who presents to the office today for follow-up of her recent cardiac catheterization.    She was examined by Dr. Lovena Le on 05/13/2018 and reported episodes of chest pressure and dyspnea on exertion over the past week. Symptoms were resolving with rest or the use of sublingual nitroglycerin.  Given her progressive symptoms, a cardiac catheterization was recommended for definitive evaluation. This was performed by Dr. Tamala Julian on 05/16/2018 and showed a patent LCx stent with 30-40% mid to distal LCx stenosis, 20-30% Proximal-LAD, and 90% narrowing along nondominant RCA. Findings were similar to prior cath in 2017 and her pain was felt to be possibly MSK or pleuritic.   In talking with the patient today, she reports still having frequent episodes of dyspnea on exertion. Says this is intermittent and can occur at rest or with activity. When she gets up to walk around her home she develops significant lower back pain and then has a hard time catching her breath due to her pain. Denies any associated chest pain with activity. Does report an aching sensation under her left breast which can occur sporadically. Not associated with exertion or positional changes.  She denies any specific orthopnea or PND. Says that weight has overall been stable on her home scales.  She remains on Lasix 40 mg  daily and takes an extra 20 mg as needed for weight gain or worsening edema.  She reports multiple somatic complaints including lower back pain, numbness and tingling along her toes bilaterally, knee pain, hemorrhoids, and recent episodes of overactive bladder.   Past Medical History:  Diagnosis Date  . Anxiety   . Arthritis   . Atrial fibrillation ([place])   . Bursitis    Left shoulder  . Cataract   . CHF (congestive heart failure) ([place])   . COPD (chronic obstructive pulmonary disease) ([place])   . Coronary atherosclerosis of native coronary artery    a. s/p DES to LCx in 04/2013 b. cath in 11/2015 showing patent stent with 20% prox-LAD and 80% ostial RCA stenosis for which medical management was recommended due to small artery size  . Depression   . Diastolic heart failure (HCC)    EF 55-60%  . Dysphagia, unspecified(787.20)   . Dyspnea   . Dysrhythmia   . Essential hypertension   . GERD (gastroesophageal reflux disease)    Hx Schatzki's ring, multiple EGD/ED last 01/06/2004  . Gout   . Headache   . History of anemia   . Hyperlipidemia   . Internal hemorrhoids without mention of complication   . MI (myocardial infarction) ([place]) 2006  . Microscopic colitis 2003  . Panic disorder without agoraphobia   . Paresthesia   . Pneumonia 12/2011  . PVD (peripheral vascular disease) ([place])   . S/P colonoscopy 09/27/2001   internal hemorrhoids, desc colon inflam polyp,  SB BX-chronic duodenitis, colitis  . Thyroid disease     Past Surgical History:  Procedure Laterality Date  . ABDOMINAL HYSTERECTOMY    . ABDOMINAL HYSTERECTOMY    . ANTERIOR AND POSTERIOR REPAIR     with resection of vagina  . ANTERIOR LAT LUMBAR FUSION N/A 08/01/2016   Procedure: Lumbar Two-Lumbar Five Transpsoas lateral interbody fusion with Lumbar Two-Three lateral plate fixation;  Surgeon: Kevan Ny Ditty, MD;  Location: [facility];  Service: Neurosurgery;  Laterality: N/A;  L2-5 Transpsoas lateral interbody fusion with  L2-3 lateral plate fixation  . APPENDECTOMY    . BACK SURGERY    . BIOPSY  07/05/2015   Procedure: BIOPSY;  Surgeon: Daneil Dolin, MD;  [facility];  Service: Endoscopy;;  gastric polyp biopsy, ascending colon biopsy  . BLADDER SUSPENSION  11/09/2011   Procedure: TRANSVAGINAL TAPE (TVT) PROCEDURE;  Surgeon: Marissa Nestle, MD;  Location: [facility];  Service: Urology;  Laterality: N/A;  . bladder tack  06/2010  . BREAST LUMPECTOMY  1998   left, benign  . CARDIAC CATHETERIZATION    . CARDIAC CATHETERIZATION    . CARDIAC CATHETERIZATION N/A 12/16/2015   Procedure: Left Heart Cath and Coronary Angiography;  Surgeon: Troy Sine, MD;  Location: [facility];  Service: Cardiovascular;  Laterality: N/A;  . CARDIOVERSION N/A 10/04/2017   Procedure: CARDIOVERSION;  Surgeon: Herminio Commons, MD;  Location: [facility];  Service: Cardiovascular;  Laterality: N/A;  . CARDIOVERSION N/A 01/30/2018   Procedure: CARDIOVERSION;  Surgeon: Herminio Commons, MD;  [facility];  Service: Cardiovascular;  Laterality: N/A;  . [place]   left  . cataract surgery    . CHOLECYSTECTOMY  1998  . Cholecystectomy    . COLONOSCOPY  03/16/2011   multiple hyperplastic colon polyps, sigmoid diverticulosis, melanosis coli  . COLONOSCOPY WITH PROPOFOL N/A 07/05/2015   RMR:one 5 mm polyp in descending colon  . CORONARY ANGIOGRAPHY N/A 05/16/2018   Procedure: CORONARY ANGIOGRAPHY (CATH LAB);  Surgeon: Belva Crome, MD;  Location: [facility];  Service: Cardiovascular;  Laterality: N/A;  . CORONARY ANGIOPLASTY WITH STENT PLACEMENT    . ESOPHAGEAL DILATION N/A 07/05/2015   Procedure: ESOPHAGEAL DILATION;  Surgeon: Daneil Dolin, MD;  [facility];  Service: Endoscopy;  Laterality: N/A;  . ESOPHAGOGASTRODUODENOSCOPY (EGD) WITH PROPOFOL N/A 07/05/2015   [id number]  . JOINT REPLACEMENT Right 2007   right knee  . left hand surgery    . LEFT HEART  CATHETERIZATION WITH CORONARY ANGIOGRAM N/A 05/14/2013   Procedure: LEFT HEART CATHETERIZATION WITH CORONARY ANGIOGRAM;  Surgeon: Blane Ohara, MD;  Location: [facility] C[facility] LAB;  Service: Cardiovascular;  Laterality: N/A;  . left rotator cuff surgery    . LUMBAR LAMINECTOMY/DECOMPRESSION MICRODISCECTOMY N/A 10/11/2012   Procedure: LUMBAR LAMINECTOMY/DECOMPRESSION MICRODISCECTOMY 2 LEVELS;  Surgeon: Floyce Stakes, MD;  Location: [facility];  Service: Neurosurgery;  Laterality: N/A;  L3-4 L4-5 Laminectomy  . LUMBAR WOUND DEBRIDEMENT N/A 09/27/2015   Procedure: Exploration of Lumbar Wound w/ Repair CSF Leak/Lumbar Drain Placement;  Surgeon: Leeroy Cha, MD;  Location: [facility];  Service: Neurosurgery;  Laterality: N/A;  . MALONEY DILATION  03/16/2011   Gastritis. No H.pylori on bx. 10F maloney dilation with disruption of  occult cevical esophageal web  . NASAL SINUS SURGERY    . right knee replacement  2007  . right leg benign tumor    . SHOULDER SURGERY Left   .  TEE WITHOUT CARDIOVERSION N/A 10/04/2017   Procedure: TRANSESOPHAGEAL ECHOCARDIOGRAM (TEE) WITH PROPOFOL;  Surgeon: Herminio Commons, MD;  Location: [facility];  Service: Cardiovascular;  Laterality: N/A;  . TONSILLECTOMY    . unspecified area, hysterectomy  1972   partial    Current Medications: Outpatient Medications Prior to Visit  Medication Sig Dispense Refill  . acetaminophen (TYLENOL) 500 MG tablet Take 500 mg by mouth every 6 (six) hours as needed for headache.     . ALPRAZolam (XANAX) 0.25 MG tablet Take 0.25 mg by mouth 2 (two) times daily as needed for anxiety.   0  . amiodarone (PACERONE) 200 MG tablet Take 1 tablet (200 mg total) by mouth daily. 90 tablet 3  . apixaban (ELIQUIS) 5 MG TABS tablet Take 1 tablet (5 mg total) by mouth 2 (two) times daily. Resume with AM dose on 05/17/2018 180 tablet 3  . DULoxetine (CYMBALTA) 60 MG capsule Take 60 mg by mouth at bedtime.     . fluticasone (FLONASE) 50 MCG/ACT nasal  spray Place 2 sprays into both nostrils 2 (two) times daily as needed for allergies.     . furosemide (LASIX) 40 MG tablet TAKE ONE TABLET BY MOUTH DAILY. (Patient taking differently: Take 40 mg by mouth daily. ) 90 tablet 3  . ipratropium-albuterol (DUONEB) 0.5-2.5 (3) MG/3ML SOLN Take 3 mLs by nebulization every 6 (six) hours. 360 mL 0  . isosorbide mononitrate (IMDUR) 60 MG 24 hr tablet Take 1 tablet (60 mg total) by mouth daily. 90 tablet 3  . lisinopril (PRINIVIL,ZESTRIL) 2.5 MG tablet Take 1 tablet (2.5 mg total) by mouth daily. 90 tablet 3  . magnesium oxide (MAG-OX) 400 MG tablet TAKE ONE TABLET BY MOUTH DAILY. (Patient taking differently: Take 400 mg by mouth daily. ) 90 tablet 3  . methocarbamol (ROBAXIN) 500 MG tablet Take 500 mg by mouth every 6 (six) hours as needed for muscle spasms.     . metoprolol succinate (TOPROL-XL) 50 MG 24 hr tablet Take 1 tablet (50 mg total) by mouth daily. Take with or immediately following a meal. 90 tablet 3  . Multiple Vitamin (MULTIVITAMIN WITH MINERALS) TABS tablet Take 1 tablet by mouth daily. Centrum     . nitroGLYCERIN (NITROSTAT) 0.4 MG SL tablet Place 1 tablet (0.4 mg total) under the tongue every 5 (five) minutes as needed for chest pain. Reported on 08/04/2015 25 tablet 6  . pantoprazole (PROTONIX) 40 MG tablet Take 40 mg by mouth daily.     . potassium chloride (K-DUR,KLOR-CON) 10 MEQ tablet TAKE TWO (2) TABLETS BY MOUTH DAILY. (Patient taking differently: Take 20 mEq by mouth daily. ) 180 tablet 3  . sodium chloride (OCEAN) 0.65 % SOLN nasal spray Place 1 spray into both nostrils 2 (two) times daily.    . furosemide (LASIX) 20 MG tablet Take 20 mg by mouth See admin instructions. Take 1 tablet (20 mg) by mouth in addition for 40 mg dose with weight gain of 4 lbs OR more for 3 days, then discontinue     No facility-administered medications prior to visit.      Allergies:   Cephalosporins; Levaquin [levofloxacin in d5w]; Macrodantin  [nitrofurantoin macrocrystal]; Phenothiazines; Polysorbate; Prednisone; Buspirone; Cardura [doxazosin mesylate]; Codeine; Acyclovir and related; Prochlorperazine; Ranexa [ranolazine]; Atorvastatin; Ofloxacin; Other; Penicillins; and Pimozide   Social History   Socioeconomic History  . Marital status: Divorced    Spouse name: Not on file  . Number of children: 5  . Years of education:  Not on file  . Highest education level: Not on file  Occupational History  . Occupation: retired  Scientific laboratory technician  . Financial resource strain: Not on file  . Food insecurity:    Worry: Not on file    Inability: Not on file  . Transportation needs:    Medical: Not on file    Non-medical: Not on file  Tobacco Use  . Smoking status: Former Smoker    Packs/day: 1.00    Years: 64.00    Pack years: 64.00    Types: Cigarettes    Start date: 12/24/1947    Last attempt to quit: 11/17/2001    Years since quitting: 16.5  . Smokeless tobacco: Never Used  . Tobacco comment: Quit smoking in 2003  Substance and Sexual Activity  . Alcohol use: No    Alcohol/week: 0.0 standard drinks  . Drug use: No  . Sexual activity: Never  Lifestyle  . Physical activity:    Days per week: Not on file    Minutes per session: Not on file  . Stress: Not on file  Relationships  . Social connections:    Talks on phone: Not on file    Gets together: Not on file    Attends religious service: Not on file    Active member of club or organization: Not on file    Attends meetings of clubs or organizations: Not on file    Relationship status: Not on file  Other Topics Concern  . Not on file  Social History Narrative   Divorced.   Sister had colon perforation & died from complications in [place], [place]     Family History:  The patient's family history includes Cancer in an other family member; Coronary artery disease in an other family member; Diabetes in her brother and son; Heart disease in her son; Hypertension in an other family  member; Parkinson's disease in her father; Stroke in her mother and another family member; Stroke (age of onset: 81) in her daughter.   Review of Systems:   Please see the history of present illness.     General:  No chills, fever, night sweats or weight changes.  Cardiovascular:  No edema, orthopnea, palpitations, paroxysmal nocturnal dyspnea. Positive for dyspnea on exertion and chest pain.  Dermatological: No rash, lesions/masses Respiratory: No cough, dyspnea Urologic: No hematuria, dysuria MSK: Positive for lower back pain and knee pain.  Abdominal:   No nausea, vomiting, diarrhea, bright red blood per rectum, melena, or hematemesis.  Positive for hemorrhoids. Neurologic:  No visual changes, wkns, changes in mental status. All other systems reviewed and are otherwise negative except as noted above.   Physical Exam:    VS:  BP (!) 132/54   Pulse 60   Ht 5\' 1"  (1.549 m)   Wt 166 lb 12.8 oz (75.7 kg)   SpO2 94%   BMI 31.52 kg/m    General: Well developed, elderly Caucasian female appearing in no acute distress. Head: Normocephalic, atraumatic, sclera non-icteric, no xanthomas, nares are without discharge.  Neck: No carotid bruits. JVD not elevated.  Lungs: Respirations regular and unlabored, without wheezes or rales.  Heart: Regular rate and rhythm. No S3 or S4.  No murmur, no rubs, or gallops appreciated. Abdomen: Soft, non-tender, non-distended with normoactive bowel sounds. No hepatomegaly. No rebound/guarding. No obvious abdominal masses. Msk:  Strength and tone appear normal for age. No joint deformities or effusions. Extremities: No clubbing or cyanosis. No lower extremity edema.  Distal pedal pulses  are 2+ bilaterally. Groin cath site stable without ecchymosis or evidence of a hematoma.  Neuro: Alert and oriented X 3. Moves all extremities spontaneously. No focal deficits noted. Psych:  Responds to questions appropriately with a normal affect. Skin: No rashes or lesions  noted  Wt Readings from Last 3 Encounters:  06/05/18 166 lb 12.8 oz (75.7 kg)  05/16/18 163 lb (73.9 kg)  05/13/18 164 lb (74.4 kg)     Studies/Labs Reviewed:   EKG:  EKG is not ordered today.    Recent Labs: 09/18/2017: B Natriuretic Peptide 512.0 12/30/2017: Magnesium 2.1 02/22/2018: TSH 7.002 03/14/2018: ALT 27 05/13/2018: Hemoglobin 11.1; Platelets 372 05/16/2018: BUN 21; Creatinine, Ser 1.58; Potassium 3.6; Sodium 138   Lipid Panel    Component Value Date/Time   CHOL 177 12/16/2015 0435   TRIG 197 (H) 12/16/2015 0435   HDL 35 (L) 12/16/2015 0435   CHOLHDL 5.1 12/16/2015 0435   VLDL 39 12/16/2015 0435   LDLCALC 103 (H) 12/16/2015 0435    Additional studies/ records that were reviewed today include:   TEE: 09/2017 Study Conclusions  - Left ventricle: The cavity size was normal. Systolic function was   mildly to moderately reduced. The estimated ejection fraction was   in the range of 40% to 45%. Diffuse hypokinesis. There was a   thrombus. - Aortic valve: Trileaflet; mildly thickened leaflets. There was   mild regurgitation. - Mitral valve: There was mild regurgitation. - Left atrium: The atrium was moderately to severely dilated. No   evidence of thrombus in the atrial cavity or appendage. No   evidence of thrombus in the atrial cavity or appendage. Pulsed   Doppler intracavitary velocities were low normal. There was   &quot;smoke&quot; seen in the appendage and left atrium indicating sluggish   flow. - Right ventricle: Systolic function was mildly reduced. - Atrial septum: No defect or patent foramen ovale was identified.  Impressions:  - Successful cardioversion. No intracardiac thrombus.  Cardiac Catheterization: 05/16/2018  Patent short left main  20 to 30% proximal LAD irregularity, with diffuse 20% narrowing within the mid vessel.  Patent proximal circumflex stent with mid to distal circumflex irregularities up to 30 to 40%.  Nondominant right  coronary with ostial 90% narrowing.  No hemodynamics recorded.  Left ventriculography was not performed in an effort to decrease contrast exposure in the setting of CKD.  RECOMMENDATIONS:   Compared to prior angiography in 2017, no significant changes noted.  Chest pain is atypical and may be more musculoskeletal or pleuritic.  Resume Eliquis in 12 hours/a.m. of 05/17/2018  Assessment:    1. Coronary artery disease of native artery of native heart with stable angina pectoris ([place])   2. Chronic combined systolic and diastolic CHF (congestive heart failure) ([place])   3. PAF (paroxysmal atrial fibrillation) ([place])   4. Essential hypertension   5. Hyperlipidemia LDL goal <70   6. CKD (chronic kidney disease), stage III ([place])      Plan:   In order of problems listed above:  1. CAD - recent catheterization last month showed a patent LCx stent with 30-40% mid to distal LCx stenosis, 20-30% Proximal-LAD, and 90% narrowing along nondominant RCA. Findings were similar to prior cath in 2017 as outlined above. - She continues to experience dyspnea on exertion but denies any associated chest pain. Reviewed her cardiac catheterization report in detail with her. I suspect her dyspnea is likely secondary to deconditioning due to her chronic back pain as she has not been  as active at baseline due to her worsening pain and multiple somatic complaints as outlined above. I did recommend checking a BNP to assess fluid status but she declined as she is having repeat labs with her PCP tomorrow. - She is not on ASA given the need for anticoagulation. Remains on Imdur and beta-blocker therapy. Statin intolerant as outlined below.  2. Chronic Combined Systolic and Diastolic CHF - EF was reduced at 40 to 45% by TEE in 09/2017. Was thought to possibly be tachycardia mediated in the setting of PAF. Recent cardiac catheterization showed no acute findings when compared to her prior study in 2017. She remains on  Lisinopril 2.5 mg daily and Toprol-XL 50 mg daily. Would consider further titration of Lisinopril if creatinine remains stable by repeat labs. Remains on Lasix 40 mg daily at this time and she does not appear volume overloaded by examination.  3. Paroxysmal Atrial Fibrillation - She does experience intermittent palpitations but is maintaining normal sinus rhythm by examination today. She has remained on Amiodarone 200 mg daily along with Toprol-XL 50 mg daily. TSH was elevated to 7.002 in 02/2018. I recommended repeating her labs today but she declined as she is scheduled for lab work tomorrow. She was provided with a list of lab work that will need to be obtained tomorrow including TSH, BMP, and BMET. - Denies any evidence of active bleeding. Remains on Eliquis 5 mg twice daily.  4. HTN - BP is well controlled at 132/54 during today's visit. Continue current medication regimen including Imdur 60 mg daily, Lisinopril 2.5 mg daily, and Toprol-XL 50 mg daily.  5. HLD - Followed by PCP. She has been intolerant to multiple statins including low-dose Crestor. Not interested in PCSK-9 inhibitor therapy at this time.  6. Stage 3 CKD - Creatinine was elevated to 1.87 in 04/2018 but had improved to 1.58 on 05/16/2018. She is due for repeat labs with her PCP tomorrow and will request these records.   Medication Adjustments/Labs and Tests Ordered: Current medicines are reviewed at length with the patient today.  Concerns regarding medicines are outlined above.  Medication changes, Labs and Tests ordered today are listed in the Patient Instructions below. Patient Instructions  Medication Instructions:  Your physician recommends that you continue on your current medications as directed. Please refer to the Current Medication list given to you today.  If you need a refill on your cardiac medications before your next appointment, please call your pharmacy.   Lab work: NONE  If you have labs (blood work)  drawn today and your tests are completely normal, you will receive your results only by: [name] Kitchen MyChart Message (if you have MyChart) OR . A paper copy in the mail If you have any lab test that is abnormal or we need to change your treatment, we will call you to review the results.  Testing/Procedures: NONE   Follow-Up: At [facility], you and your health needs are our priority.  As part of our continuing mission to provide you with exceptional heart care, we have created designated Provider Care Teams.  These Care Teams include your primary Cardiologist (physician) and Advanced Practice Providers (APPs -  Physician Assistants and Nurse Practitioners) who all work together to provide you with the care you need, when you need it. You will need a follow up appointment in 2-3 months.  Please call our office 2 months in advance to schedule this appointment.  You may see Kate Sable, MD or one of the following  Advanced Practice Providers on your designated Care Team:   [place], PA-C [facility]) . Ermalinda Barrios, PA-C ([place])  Any Other Special Instructions Will Be Listed Below (If Applicable).  Please have TSH, BMET, and BNP done at PCP office and forward results to our office.   Thank you for choosing [facility]!     Signed, Erma Heritage, PA-C  06/05/2018 5:11 PM    [facility] [address] [place], [place] [address] Phone: [phone] Fax: [phone]

## 2018-06-05 NOTE — Patient Instructions (Signed)
Medication Instructions:  Your physician recommends that you continue on your current medications as directed. Please refer to the Current Medication list given to you today.  If you need a refill on your cardiac medications before your next appointment, please call your pharmacy.   Lab work: NONE  If you have labs (blood work) drawn today and your tests are completely normal, you will receive your results only by: [name] Kitchen MyChart Message (if you have MyChart) OR . A paper copy in the mail If you have any lab test that is abnormal or we need to change your treatment, we will call you to review the results.  Testing/Procedures: NONE   Follow-Up: At [facility], you and your health needs are our priority.  As part of our continuing mission to provide you with exceptional heart care, we have created designated Provider Care Teams.  These Care Teams include your primary Cardiologist (physician) and Advanced Practice Providers (APPs -  Physician Assistants and Nurse Practitioners) who all work together to provide you with the care you need, when you need it. You will need a follow up appointment in 2-3 months.  Please call our office 2 months in advance to schedule this appointment.  You may see Kate Sable, MD or one of the following Advanced Practice Providers on your designated Care Team:   Bernerd Pho, PA-C [facility]) . Ermalinda Barrios, PA-C ([place])  Any Other Special Instructions Will Be Listed Below (If Applicable).  Please have TSH, BMET, and BNP done at PCP office and forward results to our office.   Thank you for choosing [facility]!

## 2018-06-06 DIAGNOSIS — R8279 Other abnormal findings on microbiological examination of urine: Secondary | ICD-10-CM | POA: Diagnosis not present

## 2018-06-06 DIAGNOSIS — N3001 Acute cystitis with hematuria: Secondary | ICD-10-CM | POA: Diagnosis not present

## 2018-06-06 DIAGNOSIS — D519 Vitamin B12 deficiency anemia, unspecified: Secondary | ICD-10-CM | POA: Diagnosis not present

## 2018-06-06 DIAGNOSIS — D529 Folate deficiency anemia, unspecified: Secondary | ICD-10-CM | POA: Diagnosis not present

## 2018-06-06 DIAGNOSIS — R300 Dysuria: Secondary | ICD-10-CM | POA: Diagnosis not present

## 2018-06-06 DIAGNOSIS — Z6831 Body mass index (BMI) 31.0-31.9, adult: Secondary | ICD-10-CM | POA: Diagnosis not present

## 2018-06-06 DIAGNOSIS — R946 Abnormal results of thyroid function studies: Secondary | ICD-10-CM | POA: Diagnosis not present

## 2018-06-06 DIAGNOSIS — R7989 Other specified abnormal findings of blood chemistry: Secondary | ICD-10-CM | POA: Diagnosis not present

## 2018-06-06 DIAGNOSIS — D509 Iron deficiency anemia, unspecified: Secondary | ICD-10-CM | POA: Diagnosis not present

## 2018-06-06 DIAGNOSIS — Z13220 Encounter for screening for lipoid disorders: Secondary | ICD-10-CM | POA: Diagnosis not present

## 2018-06-19 ENCOUNTER — Other Ambulatory Visit: Payer: Self-pay | Admitting: Cardiovascular Disease

## 2018-06-29 ENCOUNTER — Encounter (HOSPITAL_COMMUNITY): Payer: Self-pay | Admitting: Emergency Medicine

## 2018-06-29 ENCOUNTER — Emergency Department (HOSPITAL_COMMUNITY): Payer: Medicare Other

## 2018-06-29 ENCOUNTER — Emergency Department (HOSPITAL_COMMUNITY)
Admission: EM | Admit: 2018-06-29 | Discharge: 2018-06-29 | Disposition: A | Discharge status: Home / Self Care | Payer: Medicare Other | Attending: Emergency Medicine | Admitting: Emergency Medicine

## 2018-06-29 ENCOUNTER — Other Ambulatory Visit: Payer: Self-pay

## 2018-06-29 DIAGNOSIS — I5030 Unspecified diastolic (congestive) heart failure: Secondary | ICD-10-CM | POA: Diagnosis not present

## 2018-06-29 DIAGNOSIS — I2510 Atherosclerotic heart disease of native coronary artery without angina pectoris: Secondary | ICD-10-CM | POA: Diagnosis not present

## 2018-06-29 DIAGNOSIS — R0602 Shortness of breath: Secondary | ICD-10-CM | POA: Diagnosis not present

## 2018-06-29 DIAGNOSIS — M79672 Pain in left foot: Secondary | ICD-10-CM | POA: Diagnosis not present

## 2018-06-29 DIAGNOSIS — M10072 Idiopathic gout, left ankle and foot: Secondary | ICD-10-CM | POA: Diagnosis not present

## 2018-06-29 DIAGNOSIS — J449 Chronic obstructive pulmonary disease, unspecified: Secondary | ICD-10-CM | POA: Diagnosis not present

## 2018-06-29 DIAGNOSIS — Z87891 Personal history of nicotine dependence: Secondary | ICD-10-CM | POA: Insufficient documentation

## 2018-06-29 DIAGNOSIS — R52 Pain, unspecified: Secondary | ICD-10-CM

## 2018-06-29 DIAGNOSIS — M79675 Pain in left toe(s): Secondary | ICD-10-CM | POA: Diagnosis present

## 2018-06-29 DIAGNOSIS — I130 Hypertensive heart and chronic kidney disease with heart failure and stage 1 through stage 4 chronic kidney disease, or unspecified chronic kidney disease: Secondary | ICD-10-CM | POA: Diagnosis not present

## 2018-06-29 DIAGNOSIS — N183 Chronic kidney disease, stage 3 (moderate): Secondary | ICD-10-CM | POA: Diagnosis not present

## 2018-06-29 DIAGNOSIS — Z79899 Other long term (current) drug therapy: Secondary | ICD-10-CM | POA: Insufficient documentation

## 2018-06-29 DIAGNOSIS — R0789 Other chest pain: Secondary | ICD-10-CM | POA: Diagnosis not present

## 2018-06-29 DIAGNOSIS — R079 Chest pain, unspecified: Secondary | ICD-10-CM | POA: Diagnosis not present

## 2018-06-29 DIAGNOSIS — M1000 Idiopathic gout, unspecified site: Secondary | ICD-10-CM

## 2018-06-29 LAB — CBC
HEMATOCRIT: 35.2 % — AB (ref 36.0–46.0)
Hemoglobin: 11.1 g/dL — ABNORMAL LOW (ref 12.0–15.0)
MCH: 27.6 pg (ref 26.0–34.0)
MCHC: 31.5 g/dL (ref 30.0–36.0)
MCV: 87.6 fL (ref 80.0–100.0)
Platelets: 299 10*3/uL (ref 150–400)
RBC: 4.02 MIL/uL (ref 3.87–5.11)
RDW: 18.6 % — ABNORMAL HIGH (ref 11.5–15.5)
WBC: 8.2 10*3/uL (ref 4.0–10.5)
nRBC: 0 % (ref 0.0–0.2)

## 2018-06-29 LAB — BASIC METABOLIC PANEL
Anion gap: 9 (ref 5–15)
BUN: 19 mg/dL (ref 8–23)
CO2: 26 mmol/L (ref 22–32)
Calcium: 8.9 mg/dL (ref 8.9–10.3)
Chloride: 102 mmol/L (ref 98–111)
Creatinine, Ser: 1.38 mg/dL — ABNORMAL HIGH (ref 0.44–1.00)
GFR calc Af Amer: 41 mL/min — ABNORMAL LOW (ref >60)
GFR calc non Af Amer: 35 mL/min — ABNORMAL LOW (ref >60)
Glucose, Bld: 144 mg/dL — ABNORMAL HIGH (ref 70–99)
Potassium: 4.4 mmol/L (ref 3.5–5.1)
Sodium: 137 mmol/L (ref 135–145)

## 2018-06-29 LAB — URIC ACID: Uric Acid, Serum: 10.2 mg/dL — ABNORMAL HIGH (ref 2.5–7.1)

## 2018-06-29 LAB — HEPATIC FUNCTION PANEL
ALT: 27 U/L (ref 0–44)
AST: 31 U/L (ref 15–41)
Albumin: 3.9 g/dL (ref 3.5–5.0)
Alkaline Phosphatase: 100 U/L (ref 38–126)
Bilirubin, Direct: 0.1 mg/dL (ref 0.0–0.2)
Indirect Bilirubin: 0.4 mg/dL (ref 0.3–0.9)
TOTAL PROTEIN: 7.3 g/dL (ref 6.5–8.1)
Total Bilirubin: 0.5 mg/dL (ref 0.3–1.2)

## 2018-06-29 LAB — TROPONIN I: Troponin I: <0.03 ng/mL (ref <0.03)

## 2018-06-29 MED ORDER — HYDROCODONE-ACETAMINOPHEN 5-325 MG PO TABS
1.0000 | ORAL_TABLET | Freq: Once | ORAL | Status: AC
  Administered 2018-06-29: 1 via ORAL
  Filled 2018-06-29: qty 1

## 2018-06-29 MED ORDER — COLCHICINE 0.6 MG PO TABS: 0.6000 mg | ORAL_TABLET | Freq: Two times a day (BID) | ORAL | 0 refills | Status: DC

## 2018-06-29 MED ORDER — HYDROCODONE-ACETAMINOPHEN 5-325 MG PO TABS: 1.0000 | ORAL_TABLET | Freq: Four times a day (QID) | ORAL | 0 refills | Status: DC | PRN

## 2018-06-29 NOTE — ED Provider Notes (Signed)
[facility] EMERGENCY DEPARTMENT Provider Note   CSN: [id number] Arrival date & time: 06/29/18  1405    History   Chief Complaint Chief Complaint  Patient presents with  . Chest Pain    HPI [name] is a 85 y.o. female.     Patient complains of left great toe pain.  She also has been having chest pain but this is nothing new and she was evaluated already with cardiac cath that showed no change from previous cath.  She is much more concerned about her toe pain  The history is provided by the patient. No language interpreter was used.  Foot Pain  This is a new problem. The current episode started more than 2 days ago. The problem occurs constantly. The problem has not changed since onset.Associated symptoms include chest pain. Pertinent negatives include no abdominal pain and no headaches. Exacerbated by: movement. Nothing relieves the symptoms. She has tried nothing for the symptoms. The treatment provided no relief.    Past Medical History:  Diagnosis Date  . Anxiety   . Arthritis   . Atrial fibrillation ([place])   . Bursitis    Left shoulder  . Cataract   . CHF (congestive heart failure) ([place])   . COPD (chronic obstructive pulmonary disease) ([place])   . Coronary atherosclerosis of native coronary artery    a. s/p DES to LCx in 04/2013 b. cath in 11/2015 showing patent stent with 20% prox-LAD and 80% ostial RCA stenosis for which medical management was recommended due to small artery size  . Depression   . Diastolic heart failure (HCC)    EF 55-60%  . Dysphagia, unspecified(787.20)   . Dyspnea   . Dysrhythmia   . Essential hypertension   . GERD (gastroesophageal reflux disease)    Hx Schatzki's ring, multiple EGD/ED last 01/06/2004  . Gout   . Headache   . History of anemia   . Hyperlipidemia   . Internal hemorrhoids without mention of complication   . MI (myocardial infarction) ([place]) 2006  . Microscopic colitis 2003  . Panic disorder without agoraphobia   .  Paresthesia   . Pneumonia 12/2011  . PVD (peripheral vascular disease) ([place])   . S/P colonoscopy 09/27/2001   internal hemorrhoids, desc colon inflam polyp, SB BX-chronic duodenitis, colitis  . Thyroid disease     Patient Active Problem List   Diagnosis Date Noted  . Persistent atrial fibrillation 12/30/2017  . Chronic combined systolic and diastolic congestive heart failure ([place]) 12/30/2017  . Hyperammonemia ([place]) 02/20/2017  . Myoclonic jerking 02/20/2017  . Atrial fibrillation ([name]) 02/19/2017  . Acute gout 02/17/2017  . Chronic systolic heart failure ([place])   . Chest pain 09/30/2016  . Status post lumbar spine surgery for decompression of spinal cord 08/07/2016  . Lumbosacral spondylosis with radiculopathy 08/01/2016  . Preoperative clearance 07/05/2016  . Cough 05/26/2016  . Weakness 04/26/2016  . Nausea without vomiting 04/26/2016  . Atrial fibrillation with RVR ([place]) 04/15/2016  . DOE (dyspnea on exertion) 03/20/2016  . Hypoxia   . Acute on chronic diastolic congestive heart failure ([place])   . Chronic kidney disease, stage III (moderate) ([place]) 02/17/2016  . Essential hypertension 02/17/2016  . COPD exacerbation ([place]) 02/17/2016  . Acute bronchitis 12/18/2015  . CAD in native artery   . Chest pain at rest 10/15/2015  . Chronic low back pain 10/15/2015  . Hyponatremia 10/15/2015  . Hyperglycemia 10/15/2015  . Thrombocytosis ([place]) 10/15/2015  . Atypical chest pain 10/15/2015  .  Depression   . Anxiety   . Gastroesophageal reflux disease without esophagitis   . Mild cognitive impairment 10/14/2015  . Iron deficiency anemia due to chronic blood loss   . Meningitis 10/01/2015  . Coronary artery disease due to lipid rich plaque   . NSVT (nonsustained ventricular tachycardia) ([place])   . PAF (paroxysmal atrial fibrillation) ([place])   . CSF leak 09/27/2015  . Spondylolisthesis of lumbar region 09/24/2015  . Gastric polyp   . History of colonic polyps   . Chronic diarrhea   .  Esophageal dysphagia 04/01/2015  . PAOD (peripheral arterial occlusive disease) ([place]) 03/05/2015  . Pain in the chest   . Hyperlipidemia   . Weight gain 08/12/2014  . Anemia 08/07/2014  . Hemorrhoids 08/06/2014  . CHF (congestive heart failure) ([place]) 07/29/2014  . Rectal bleeding 07/06/2014  . Constipation 07/06/2014  . Lower extremity edema 11/24/2013  . Acute kidney failure ([place]) 11/24/2013  . Hypokalemia 11/24/2013  . Other and unspecified angina pectoris 05/14/2013  . Hematochezia 02/13/2011  . Abdominal pain 02/13/2011  . Major depression ([place]) 09/28/2010  . FATIGUE 04/13/2009  . Chronic diastolic heart failure ([place]) 11/30/2008  . DYSPNEA 11/30/2008  . CAD, NATIVE VESSEL - PCI + DES to left circumflex 05/14/13 11/27/2008  . Peripheral vascular disease ([place]) 11/27/2008  . PANIC ATTACK 02/28/2008  . MI 02/28/2008  . Internal hemorrhoids 02/28/2008  . COLITIS 02/28/2008  . Dysphagia 02/28/2008    Past Surgical History:  Procedure Laterality Date  . ABDOMINAL HYSTERECTOMY    . ABDOMINAL HYSTERECTOMY    . ANTERIOR AND POSTERIOR REPAIR     with resection of vagina  . ANTERIOR LAT LUMBAR FUSION N/A 08/01/2016   Procedure: Lumbar Two-Lumbar Five Transpsoas lateral interbody fusion with Lumbar Two-Three lateral plate fixation;  Surgeon: Kevan Ny Ditty, MD;  Location: [facility];  Service: Neurosurgery;  Laterality: N/A;  L2-5 Transpsoas lateral interbody fusion with L2-3 lateral plate fixation  . APPENDECTOMY    . BACK SURGERY    . BIOPSY  07/05/2015   Procedure: BIOPSY;  Surgeon: Daneil Dolin, MD;  [facility];  Service: Endoscopy;;  gastric polyp biopsy, ascending colon biopsy  . BLADDER SUSPENSION  11/09/2011   Procedure: TRANSVAGINAL TAPE (TVT) PROCEDURE;  Surgeon: Marissa Nestle, MD;  Location: [facility] OR[facility];  Service: Urology;  Laterality: N/A;  . bladder tack  06/2010  . BREAST LUMPECTOMY  1998   left, benign  . CARDIAC CATHETERIZATION    . CARDIAC  CATHETERIZATION    . CARDIAC CATHETERIZATION N/A 12/16/2015   Procedure: Left Heart Cath and Coronary Angiography;  Surgeon: Troy Sine, MD;  Location: [facility];  Service: Cardiovascular;  Laterality: N/A;  . CARDIOVERSION N/A 10/04/2017   Procedure: CARDIOVERSION;  Surgeon: Herminio Commons, MD;  Location: [facility];  Service: Cardiovascular;  Laterality: N/A;  . CARDIOVERSION N/A 01/30/2018   Procedure: CARDIOVERSION;  Surgeon: Herminio Commons, MD;  [facility];  Service: Cardiovascular;  Laterality: N/A;  . [place]   left  . cataract surgery    . CHOLECYSTECTOMY  1998  . Cholecystectomy    . COLONOSCOPY  03/16/2011   multiple hyperplastic colon polyps, sigmoid diverticulosis, melanosis coli  . COLONOSCOPY WITH PROPOFOL N/A 07/05/2015   RMR:one 5 mm polyp in descending colon  . CORONARY ANGIOGRAPHY N/A 05/16/2018   Procedure: CORONARY ANGIOGRAPHY (CATH LAB);  Surgeon: Belva Crome, MD;  Location: [facility];  Service:  Cardiovascular;  Laterality: N/A;  . CORONARY ANGIOPLASTY WITH STENT PLACEMENT    . ESOPHAGEAL DILATION N/A 07/05/2015   Procedure: ESOPHAGEAL DILATION;  Surgeon: Daneil Dolin, MD;  [facility];  Service: Endoscopy;  Laterality: N/A;  . ESOPHAGOGASTRODUODENOSCOPY (EGD) WITH PROPOFOL N/A 07/05/2015   OMB:TDHRCB  . JOINT REPLACEMENT Right 2007   right knee  . left hand surgery    . LEFT HEART CATHETERIZATION WITH CORONARY ANGIOGRAM N/A 05/14/2013   Procedure: LEFT HEART CATHETERIZATION WITH CORONARY ANGIOGRAM;  Surgeon: Blane Ohara, MD;  Location: [facility] CATH LAB;  Service: Cardiovascular;  Laterality: N/A;  . left rotator cuff surgery    . LUMBAR LAMINECTOMY/DECOMPRESSION MICRODISCECTOMY N/A 10/11/2012   Procedure: LUMBAR LAMINECTOMY/DECOMPRESSION MICRODISCECTOMY 2 LEVELS;  Surgeon: Floyce Stakes, MD;  Location: [facility];  Service: Neurosurgery;  Laterality: N/A;  L3-4 L4-5 Laminectomy  . LUMBAR  WOUND DEBRIDEMENT N/A 09/27/2015   Procedure: Exploration of Lumbar Wound w/ Repair CSF Leak/Lumbar Drain Placement;  Surgeon: Leeroy Cha, MD;  Location: [facility]UR[facility] ORS;  Service: Neurosurgery;  Laterality: N/A;  . MALONEY DILATION  03/16/2011   Gastritis. No H.pylori on bx. 57F maloney dilation with disruption of  occult cevical esophageal web  . NASAL SINUS SURGERY    . right knee replacement  2007  . right leg benign tumor    . SHOULDER SURGERY Left   . TEE WITHOUT CARDIOVERSION N/A 10/04/2017   Procedure: TRANSESOPHAGEAL ECHOCARDIOGRAM (TEE) WITH PROPOFOL;  Surgeon: Herminio Commons, MD;  Location: [facility];  Service: Cardiovascular;  Laterality: N/A;  . TONSILLECTOMY    . unspecified area, hysterectomy  1972   partial     OB History    Gravida  8   Para  6   Term      Preterm      AB  2   Living  5     SAB      TAB      Ectopic      Multiple      Live Births               Home Medications    Prior to Admission medications   Medication Sig Start Date End Date Taking? Authorizing Provider  apixaban (ELIQUIS) 5 MG TABS tablet Take 1 tablet (5 mg total) by mouth 2 (two) times daily. Resume with AM dose on 05/17/2018 05/17/18  Yes Belva Crome, MD  DULoxetine (CYMBALTA) 60 MG capsule Take 60 mg by mouth at bedtime.    Yes [provider]  furosemide (LASIX) 40 MG tablet TAKE ONE TABLET BY MOUTH DAILY. Patient taking differently: Take 40 mg by mouth daily.  04/02/18  Yes Herminio Commons, MD  lisinopril (PRINIVIL,ZESTRIL) 2.5 MG tablet TAKE ONE TABLET BY MOUTH DAILY. 06/19/18  Yes Herminio Commons, MD  magnesium oxide (MAG-OX) 400 MG tablet TAKE ONE TABLET BY MOUTH DAILY. Patient taking differently: Take 400 mg by mouth daily.  05/03/18  Yes Herminio Commons, MD  Multiple Vitamin (MULTIVITAMIN WITH MINERALS) TABS tablet Take 1 tablet by mouth daily. Centrum    Yes [provider]  acetaminophen (TYLENOL) 500 MG tablet Take 500 mg by  mouth every 6 (six) hours as needed for headache.     [provider]  ALPRAZolam Duanne Moron) 0.25 MG tablet Take 0.25 mg by mouth 2 (two) times daily as needed for anxiety.  09/04/16   [provider]  amiodarone (PACERONE) 200 MG tablet Take 1  tablet (200 mg total) by mouth daily. 12/10/17   Evans Lance, MD  colchicine 0.6 MG tablet Take 1 tablet (0.6 mg total) by mouth 2 (two) times daily. 06/29/18   Milton Ferguson, MD  fluticasone (FLONASE) 50 MCG/ACT nasal spray Place 2 sprays into both nostrils 2 (two) times daily as needed for allergies.     [provider]  HYDROcodone-acetaminophen (NORCO/VICODIN) 5-325 MG tablet Take 1 tablet by mouth every 6 (six) hours as needed. 06/29/18   Milton Ferguson, MD  ipratropium-albuterol (DUONEB) 0.5-2.5 (3) MG/3ML SOLN Take 3 mLs by nebulization every 6 (six) hours. 12/18/15   Regalado, Belkys A, MD  isosorbide mononitrate (IMDUR) 60 MG 24 hr tablet Take 1 tablet (60 mg total) by mouth daily. 02/22/18   Herminio Commons, MD  levothyroxine (SYNTHROID, LEVOTHROID) 25 MCG tablet Take 1 tablet by mouth daily. 06/07/18   [provider]  methocarbamol (ROBAXIN) 500 MG tablet Take 500 mg by mouth every 6 (six) hours as needed for muscle spasms.     [provider]  metoprolol succinate (TOPROL-XL) 50 MG 24 hr tablet Take 1 tablet (50 mg total) by mouth daily. Take with or immediately following a meal. 01/30/18 05/15/19  Herminio Commons, MD  nitroGLYCERIN (NITROSTAT) 0.4 MG SL tablet Place 1 tablet (0.4 mg total) under the tongue every 5 (five) minutes as needed for chest pain. Reported on 08/04/2015 11/20/17   Herminio Commons, MD  pantoprazole (PROTONIX) 40 MG tablet Take 40 mg by mouth daily.     [provider]  potassium chloride (K-DUR,KLOR-CON) 10 MEQ tablet TAKE TWO (2) TABLETS BY MOUTH DAILY. Patient taking differently: Take 20 mEq by mouth daily.  05/03/18   Herminio Commons, MD  sodium chloride  (OCEAN) 0.65 % SOLN nasal spray Place 1 spray into both nostrils 2 (two) times daily.    [provider]    Family History Family History  Problem Relation Age of Onset  . Stroke Mother   . Parkinson's disease Father   . Coronary artery disease Other        family Hx-sons  . Cancer Other   . Stroke Other        family Hx  . Hypertension Other        family Hx  . Diabetes Brother   . Heart disease Son        before age 5  . Diabetes Son   . Stroke Daughter 20  . Colon cancer Neg Hx     Social History Social History   Tobacco Use  . Smoking status: Former Smoker    Packs/day: 1.00    Years: 64.00    Pack years: 64.00    Types: Cigarettes    Start date: 12/24/1947    Last attempt to quit: 11/17/2001    Years since quitting: 16.6  . Smokeless tobacco: Never Used  . Tobacco comment: Quit smoking in 2003  Substance Use Topics  . Alcohol use: No    Alcohol/week: 0.0 standard drinks  . Drug use: No     Allergies   Cephalosporins; Levaquin [levofloxacin in d5w]; Macrodantin [nitrofurantoin macrocrystal]; Phenothiazines; Polysorbate; Prednisone; Buspirone; Cardura [doxazosin mesylate]; Codeine; Acyclovir and related; Prochlorperazine; Ranexa [ranolazine]; Atorvastatin; Ofloxacin; Other; Penicillins; and Pimozide   Review of Systems Review of Systems  Constitutional: Negative for appetite change and fatigue.  HENT: Negative for congestion, ear discharge and sinus pressure.   Eyes: Negative for discharge.  Respiratory: Negative for cough.  Cardiovascular: Positive for chest pain.  Gastrointestinal: Negative for abdominal pain and diarrhea.  Genitourinary: Negative for frequency and hematuria.  Musculoskeletal: Negative for back pain.       Left great toe pain  Skin: Negative for rash.  Neurological: Negative for seizures and headaches.  Psychiatric/Behavioral: Negative for hallucinations.     Physical Exam Updated Vital Signs BP (!) 132/56   Pulse 64    Temp 98.2 F (36.8 C) (Oral)   Resp 15   Ht 5\' 1"  (1.549 m)   Wt 73 kg   SpO2 100%   BMI 30.42 kg/m   Physical Exam Vitals signs and nursing note reviewed.  Constitutional:      Appearance: She is well-developed.  HENT:     Head: Normocephalic.     Nose: Nose normal.  Eyes:     General: No scleral icterus.    Conjunctiva/sclera: Conjunctivae normal.  Neck:     Musculoskeletal: Neck supple.     Thyroid: No thyromegaly.  Cardiovascular:     Rate and Rhythm: Normal rate and regular rhythm.     Heart sounds: No murmur. No friction rub. No gallop.   Pulmonary:     Breath sounds: No stridor. No wheezing or rales.  Chest:     Chest wall: No tenderness.  Abdominal:     General: There is no distension.     Tenderness: There is no abdominal tenderness. There is no rebound.  Musculoskeletal: Normal range of motion.     Comments: Under swollen left great toe  Lymphadenopathy:     Cervical: No cervical adenopathy.  Skin:    Findings: No erythema or rash.  Neurological:     Mental Status: She is oriented to person, place, and time.     Motor: No abnormal muscle tone.     Coordination: Coordination normal.  Psychiatric:        Behavior: Behavior normal.      ED Treatments / Results  Labs (all labs ordered are listed, but only abnormal results are displayed) Labs Reviewed  BASIC METABOLIC PANEL - Abnormal; Notable for the following components:      Result Value   Glucose, Bld 144 (*)    Creatinine, Ser 1.38 (*)    GFR calc non Af Amer 35 (*)    GFR calc Af Amer 41 (*)    All other components within normal limits  CBC - Abnormal; Notable for the following components:   Hemoglobin 11.1 (*)    HCT 35.2 (*)    RDW 18.6 (*)    All other components within normal limits  URIC ACID - Abnormal; Notable for the following components:   Uric Acid, Serum 10.2 (*)    All other components within normal limits  TROPONIN I  HEPATIC FUNCTION PANEL    EKG EKG Interpretation   Date/Time:  Saturday June 29 2018 14:27:55 EDT Ventricular Rate:  75 PR Interval:  156 QRS Duration: 82 QT Interval:  422 QTC Calculation: 471 R Axis:   -43 Text Interpretation:  Sinus rhythm with Premature supraventricular complexes Left axis deviation Low voltage QRS Septal infarct , age undetermined Abnormal ECG Confirmed by Milton Ferguson [phone]) on 06/29/2018 5:23:35 PM Also confirmed by Milton Ferguson [phone])  on 06/29/2018 5:29:48 PM   Radiology Dg Chest 2 View  Result Date: 06/29/2018 CLINICAL DATA:  Chest pain and shortness of breath for several weeks. Recent development of left foot pain and swelling. EXAM: CHEST - 2 VIEW COMPARISON:  12/30/2017. FINDINGS:  Normal sized heart with an interval decrease in size. The lungs are mildly hyperexpanded with mild diffuse peribronchial thickening and accentuation of the interstitial markings. No pleural fluid. Mild scoliosis. Diffuse osteopenia. IMPRESSION: No acute abnormality. Mild changes of COPD and chronic bronchitis. Electronically Signed   By: Claudie Revering M.D.   On: 06/29/2018 15:12   Dg Foot Complete Left  Result Date: 06/29/2018 CLINICAL DATA:  Redness and left foot pain. EXAM: LEFT FOOT - COMPLETE 3+ VIEW COMPARISON:  Foot radiograph 11/25/2015 FINDINGS: First MTP joint degenerative changes. No acute fracture or dislocation. Regional soft tissues are unremarkable. No aggressive appearing osseous lesions. IMPRESSION: No acute osseous abnormality. First MTP joint degenerative changes. Electronically Signed   By: Lovey Newcomer M.D.   On: 06/29/2018 18:32    Procedures Procedures (including critical care time)  Medications Ordered in ED Medications  HYDROcodone-acetaminophen (NORCO/VICODIN) 5-325 MG per tablet 1 tablet (1 tablet Oral Given 06/29/18 1750)     Initial Impression / Assessment and Plan / ED Course  I have reviewed the triage vital signs and the nursing notes.  Pertinent labs & imaging results that were available during  my care of the patient were reviewed by me and considered in my medical decision making (see chart for details).        Left great toe pain consistent with gout.  Patient will be put on colchicine and hydrocodone.  Patient also had mention to the nurse about chest discomfort but she told me that it was difficult for her to hurt.  She has had a complete cardiac work-up recently and labs and EKG are unremarkable here so I suspect this is her stable atypical chest discomfort  Final Clinical Impressions(s) / ED Diagnoses   Final diagnoses:  Idiopathic gout, unspecified chronicity, unspecified site  Atypical chest pain    ED Discharge Orders         Ordered    colchicine 0.6 MG tablet  2 times daily     06/29/18 1922    HYDROcodone-acetaminophen (NORCO/VICODIN) 5-325 MG tablet  Every 6 hours PRN     06/29/18 Lezlie Octave, MD 06/29/18 1932

## 2018-06-29 NOTE — Discharge Instructions (Addendum)
Follow-up with your doctor in the next week for recheck

## 2018-06-29 NOTE — ED Triage Notes (Signed)
Pt states she has been having chest pain and shortness of breath for several weeks unchanging.  Recently began having left foot pain with swelling (none visible).

## 2018-07-23 DIAGNOSIS — F331 Major depressive disorder, recurrent, moderate: Secondary | ICD-10-CM | POA: Insufficient documentation

## 2018-07-24 DIAGNOSIS — M109 Gout, unspecified: Secondary | ICD-10-CM | POA: Diagnosis not present

## 2018-08-05 ENCOUNTER — Telehealth: Payer: Self-pay | Admitting: Cardiovascular Disease

## 2018-08-05 NOTE — Telephone Encounter (Signed)
Spoke with pt who says DR Olena Heckle told him to stop amiodarone says it wasn't benefiting him. Will update medication list and forward to provider FYI

## 2018-08-05 NOTE — Telephone Encounter (Signed)
Dr. Olena Heckle office called stating that patient has stopped taking the amiodarone (PACERONE) 200 MG tablet . Dr. Olena Heckle wanted Dr. Bronson Ing to be updated.

## 2018-08-05 NOTE — Telephone Encounter (Signed)
Ok, but Dr. Lovena Le recommended she stay on it as per last office note.

## 2018-08-08 ENCOUNTER — Telehealth: Payer: Self-pay | Admitting: Cardiovascular Disease

## 2018-08-08 NOTE — Telephone Encounter (Signed)
Virtual Visit Pre-Appointment Phone Call  "(Name), I am calling you today to discuss your upcoming appointment. We are currently trying to limit exposure to the virus that causes COVID-19 by seeing patients at home rather than in the office."  1. "What is the BEST phone number to call the day of the visit?" - include this in appointment notes  2. Do you have or have access to (through a family member/friend) a smartphone with video capability that we can use for your visit?" a. If yes - list this number in appt notes as cell (if different from BEST phone #) and list the appointment type as a VIDEO visit in appointment notes b. If no - list the appointment type as a PHONE visit in appointment notes  3. Confirm consent - "In the setting of the current Covid19 crisis, you are scheduled for a (phone or video) visit with your provider on (date) at (time).  Just as we do with many in-office visits, in order for you to participate in this visit, we must obtain consent.  If you'd like, I can send this to your mychart (if signed up) or email for you to review.  Otherwise, I can obtain your verbal consent now.  All virtual visits are billed to your insurance company just like a normal visit would be.  By agreeing to a virtual visit, we'd like you to understand that the technology does not allow for your provider to perform an examination, and thus may limit your provider's ability to fully assess your condition. If your provider identifies any concerns that need to be evaluated in person, we will make arrangements to do so.  Finally, though the technology is pretty good, we cannot assure that it will always work on either your or our end, and in the setting of a video visit, we may have to convert it to a phone-only visit.  In either situation, we cannot ensure that we have a secure connection.  Are you willing to proceed?" STAFF: Did the patient verbally acknowledge consent to telehealth visit? Document  YES/NO here: Yes  4. Advise patient to be prepared - "Two hours prior to your appointment, go ahead and check your blood pressure, pulse, oxygen saturation, and your weight (if you have the equipment to check those) and write them all down. When your visit starts, your provider will ask you for this information. If you have an Apple Watch or Kardia device, please plan to have heart rate information ready on the day of your appointment. Please have a pen and paper handy nearby the day of the visit as well."  5. Give patient instructions for MyChart download to smartphone OR [email or web address] as below if video visit (depending on what platform provider is using)  6. Inform patient they will receive a phone call 15 minutes prior to their appointment time (may be from unknown caller ID) so they should be prepared to answer    TELEPHONE CALL NOTE  [name] has been deemed a candidate for a follow-up tele-health visit to limit community exposure during the Covid-19 pandemic. I spoke with the patient via phone to ensure availability of phone/video source, confirm preferred email & phone number, and discuss instructions and expectations.  I reminded [name] to be prepared with any vital sign and/or heart rhythm information that could potentially be obtained via home monitoring, at the time of her visit. I reminded [name] to expect a phone call prior to  her visit.  [name] 08/08/2018 4:25 PM

## 2018-08-15 ENCOUNTER — Telehealth (INDEPENDENT_AMBULATORY_CARE_PROVIDER_SITE_OTHER): Payer: Medicare Other | Admitting: Cardiovascular Disease

## 2018-08-15 ENCOUNTER — Encounter: Payer: Self-pay | Admitting: Cardiovascular Disease

## 2018-08-15 VITALS — BP 125/66 | HR 78 | Ht 61.0 in | Wt 162.0 lb

## 2018-08-15 DIAGNOSIS — E782 Mixed hyperlipidemia: Secondary | ICD-10-CM | POA: Diagnosis not present

## 2018-08-15 DIAGNOSIS — N183 Chronic kidney disease, stage 3 unspecified: Secondary | ICD-10-CM

## 2018-08-15 DIAGNOSIS — I25118 Atherosclerotic heart disease of native coronary artery with other forms of angina pectoris: Secondary | ICD-10-CM | POA: Diagnosis not present

## 2018-08-15 DIAGNOSIS — E785 Hyperlipidemia, unspecified: Secondary | ICD-10-CM

## 2018-08-15 DIAGNOSIS — I5042 Chronic combined systolic (congestive) and diastolic (congestive) heart failure: Secondary | ICD-10-CM

## 2018-08-15 DIAGNOSIS — I4811 Longstanding persistent atrial fibrillation: Secondary | ICD-10-CM

## 2018-08-15 DIAGNOSIS — E039 Hypothyroidism, unspecified: Secondary | ICD-10-CM | POA: Diagnosis not present

## 2018-08-15 DIAGNOSIS — I10 Essential (primary) hypertension: Secondary | ICD-10-CM | POA: Diagnosis not present

## 2018-08-15 NOTE — Progress Notes (Signed)
Virtual Visit via Telephone Note   This visit type was conducted due to national recommendations for restrictions regarding the COVID-19 Pandemic (e.g. social distancing) in an effort to limit this patient's exposure and mitigate transmission in our community.  Due to her co-morbid illnesses, this patient is at least at moderate risk for complications without adequate follow up.  This format is felt to be most appropriate for this patient at this time.  The patient did not have access to video technology/had technical difficulties with video requiring transitioning to audio format only (telephone).  All issues noted in this document were discussed and addressed.  No physical exam could be performed with this format.  Please refer to the patient's chart for her  consent to telehealth for [facility].   Evaluation Performed:  Follow-up visit  Date:  08/15/2018   ID:  [name], DOB 08-12-1932, MRN [id number]  Patient Location: Home Provider Location: Home  PCP:  Rosalee Kaufman, PA-C  Cardiologist:  Kate Sable, MD  Electrophysiologist:  Cristopher Peru, MD   Chief Complaint:  CAD  History of Present Illness:    [name] is a 85 y.o. female with a h/o CAD.  She underwent cardiac cath earlier this year for symptoms of chest pain and findings were similar to 2017 cath.  She saw B. Strader PA-C on 06/05/18. She complained of exertional dyspnea felt secondary to deconditioning. She had and continues to have multiple somatic complaints including back pain, knee pain, tingling/numbness of both feet and legs, and insomnia.  She said CPAP machine is broken. She's also been struggling with seasonal allergies.  Her PCP stopped amiodarone.  She was recently treated for gout in the ED with colchicine and Norco.  The patient does not have symptoms concerning for COVID-19 infection (fever, chills, cough, or new shortness of breath).    Past Medical History:  Diagnosis Date   . Anxiety   . Arthritis   . Atrial fibrillation ([place])   . Bursitis    Left shoulder  . Cataract   . CHF (congestive heart failure) ([place])   . COPD (chronic obstructive pulmonary disease) ([place])   . Coronary atherosclerosis of native coronary artery    a. s/p DES to LCx in 04/2013 b. cath in 11/2015 showing patent stent with 20% prox-LAD and 80% ostial RCA stenosis for which medical management was recommended due to small artery size  . Depression   . Diastolic heart failure (HCC)    EF 55-60%  . Dysphagia, unspecified(787.20)   . Dyspnea   . Dysrhythmia   . Essential hypertension   . GERD (gastroesophageal reflux disease)    Hx Schatzki's ring, multiple EGD/ED last 01/06/2004  . Gout   . Headache   . History of anemia   . Hyperlipidemia   . Internal hemorrhoids without mention of complication   . MI (myocardial infarction) ([place]) 2006  . Microscopic colitis 2003  . Panic disorder without agoraphobia   . Paresthesia   . Pneumonia 12/2011  . PVD (peripheral vascular disease) ([place])   . S/P colonoscopy 09/27/2001   internal hemorrhoids, desc colon inflam polyp, SB BX-chronic duodenitis, colitis  . Thyroid disease    Past Surgical History:  Procedure Laterality Date  . ABDOMINAL HYSTERECTOMY    . ABDOMINAL HYSTERECTOMY    . ANTERIOR AND POSTERIOR REPAIR     with resection of vagina  . ANTERIOR LAT LUMBAR FUSION N/A 08/01/2016   Procedure: Lumbar Two-Lumbar Five Transpsoas lateral interbody  fusion with Lumbar Two-Three lateral plate fixation;  Surgeon: Kevan Ny Ditty, MD;  Location: [facility];  Service: Neurosurgery;  Laterality: N/A;  L2-5 Transpsoas lateral interbody fusion with L2-3 lateral plate fixation  . APPENDECTOMY    . BACK SURGERY    . BIOPSY  07/05/2015   Procedure: BIOPSY;  Surgeon: Daneil Dolin, MD;  [facility];  Service: Endoscopy;;  gastric polyp biopsy, ascending colon biopsy  . BLADDER SUSPENSION  11/09/2011   Procedure: TRANSVAGINAL TAPE (TVT)  PROCEDURE;  Surgeon: Marissa Nestle, MD;  Location: [facility] OR[facility];  Service: Urology;  Laterality: N/A;  . bladder tack  06/2010  . BREAST LUMPECTOMY  1998   left, benign  . CARDIAC CATHETERIZATION    . CARDIAC CATHETERIZATION    . CARDIAC CATHETERIZATION N/A 12/16/2015   Procedure: Left Heart Cath and Coronary Angiography;  Surgeon: Troy Sine, MD;  Location: [facility];  Service: Cardiovascular;  Laterality: N/A;  . CARDIOVERSION N/A 10/04/2017   Procedure: CARDIOVERSION;  Surgeon: Herminio Commons, MD;  Location: [facility];  Service: Cardiovascular;  Laterality: N/A;  . CARDIOVERSION N/A 01/30/2018   Procedure: CARDIOVERSION;  Surgeon: Herminio Commons, MD;  [facility];  Service: Cardiovascular;  Laterality: N/A;  . [place]   left  . cataract surgery    . CHOLECYSTECTOMY  1998  . Cholecystectomy    . COLONOSCOPY  03/16/2011   multiple hyperplastic colon polyps, sigmoid diverticulosis, melanosis coli  . COLONOSCOPY WITH PROPOFOL N/A 07/05/2015   RMR:one 5 mm polyp in descending colon  . CORONARY ANGIOGRAPHY N/A 05/16/2018   Procedure: CORONARY ANGIOGRAPHY (CATH LAB);  Surgeon: Belva Crome, MD;  Location: [facility];  Service: Cardiovascular;  Laterality: N/A;  . CORONARY ANGIOPLASTY WITH STENT PLACEMENT    . ESOPHAGEAL DILATION N/A 07/05/2015   Procedure: ESOPHAGEAL DILATION;  Surgeon: Daneil Dolin, MD;  [facility];  Service: Endoscopy;  Laterality: N/A;  . ESOPHAGOGASTRODUODENOSCOPY (EGD) WITH PROPOFOL N/A 07/05/2015   ZOX:[id number]  . JOINT REPLACEMENT Right 2007   right knee  . left hand surgery    . LEFT HEART CATHETERIZATION WITH CORONARY ANGIOGRAM N/A 05/14/2013   Procedure: LEFT HEART CATHETERIZATION WITH CORONARY ANGIOGRAM;  Surgeon: Blane Ohara, MD;  [facility] CATH LAB;  Service: Cardiovascular;  Laterality: N/A;  . left rotator cuff surgery    . LUMBAR LAMINECTOMY/DECOMPRESSION MICRODISCECTOMY N/A  10/11/2012   Procedure: LUMBAR LAMINECTOMY/DECOMPRESSION MICRODISCECTOMY 2 LEVELS;  Surgeon: Floyce Stakes, MD;  Location: [facility] NEUR[facility] ORS;  Service: Neurosurgery;  Laterality: N/A;  L3-4 L4-5 Laminectomy  . LUMBAR WOUND DEBRIDEMENT N/A 09/27/2015   Procedure: Exploration of Lumbar Wound w/ Repair CSF Leak/Lumbar Drain Placement;  Surgeon: Leeroy Cha, MD;  Location: [facility] NEURO ORS;  Service: Neurosurgery;  Laterality: N/A;  . MALONEY DILATION  03/16/2011   Gastritis. No H.pylori on bx. 65F maloney dilation with disruption of  occult cevical esophageal web  . NASAL SINUS SURGERY    . right knee replacement  2007  . right leg benign tumor    . SHOULDER SURGERY Left   . TEE WITHOUT CARDIOVERSION N/A 10/04/2017   Procedure: TRANSESOPHAGEAL ECHOCARDIOGRAM (TEE) WITH PROPOFOL;  Surgeon: Herminio Commons, MD;  [facility];  Service: Cardiovascular;  Laterality: N/A;  . TONSILLECTOMY    . unspecified area, hysterectomy  1972   partial     Current Meds  Medication Sig  . acetaminophen (TYLENOL) 500 MG  tablet Take 500 mg by mouth every 6 (six) hours as needed for headache.   . ALPRAZolam (XANAX) 0.25 MG tablet Take 0.25 mg by mouth 2 (two) times daily as needed for anxiety.   [name] Kitchen apixaban (ELIQUIS) 5 MG TABS tablet Take 1 tablet (5 mg total) by mouth 2 (two) times daily. Resume with AM dose on 05/17/2018  . colchicine 0.6 MG tablet Take 1 tablet (0.6 mg total) by mouth 2 (two) times daily.  . DULoxetine (CYMBALTA) 60 MG capsule Take 60 mg by mouth at bedtime.   . fluticasone (FLONASE) 50 MCG/ACT nasal spray Place 2 sprays into both nostrils 2 (two) times daily as needed for allergies.   . furosemide (LASIX) 40 MG tablet TAKE ONE TABLET BY MOUTH DAILY. (Patient taking differently: Take 40 mg by mouth daily. )  . HYDROcodone-acetaminophen (NORCO/VICODIN) 5-325 MG tablet Take 1 tablet by mouth every 6 (six) hours as needed.  [name] Kitchen ipratropium-albuterol (DUONEB) 0.5-2.5 (3) MG/3ML SOLN Take 3 mLs by  nebulization every 6 (six) hours.  . isosorbide mononitrate (IMDUR) 60 MG 24 hr tablet Take 1 tablet (60 mg total) by mouth daily.  [name] Kitchen levothyroxine (SYNTHROID, LEVOTHROID) 25 MCG tablet Take 1 tablet by mouth daily.  [name] Kitchen lisinopril (PRINIVIL,ZESTRIL) 2.5 MG tablet TAKE ONE TABLET BY MOUTH DAILY.  . magnesium oxide (MAG-OX) 400 MG tablet TAKE ONE TABLET BY MOUTH DAILY. (Patient taking differently: Take 400 mg by mouth daily. )  . methocarbamol (ROBAXIN) 500 MG tablet Take 500 mg by mouth every 6 (six) hours as needed for muscle spasms.   . metoprolol succinate (TOPROL-XL) 50 MG 24 hr tablet Take 1 tablet (50 mg total) by mouth daily. Take with or immediately following a meal.  . Multiple Vitamin (MULTIVITAMIN WITH MINERALS) TABS tablet Take 1 tablet by mouth daily. Centrum   . nitroGLYCERIN (NITROSTAT) 0.4 MG SL tablet Place 1 tablet (0.4 mg total) under the tongue every 5 (five) minutes as needed for chest pain. Reported on 08/04/2015  . pantoprazole (PROTONIX) 40 MG tablet Take 40 mg by mouth daily.   . potassium chloride (K-DUR,KLOR-CON) 10 MEQ tablet TAKE TWO (2) TABLETS BY MOUTH DAILY. (Patient taking differently: Take 20 mEq by mouth daily. )  . sodium chloride (OCEAN) 0.65 % SOLN nasal spray Place 1 spray into both nostrils 2 (two) times daily.     Allergies:   Cephalosporins; Levaquin [levofloxacin in d5w]; Macrodantin [nitrofurantoin macrocrystal]; Phenothiazines; Polysorbate; Prednisone; Buspirone; Cardura [doxazosin mesylate]; Codeine; Acyclovir and related; Prochlorperazine; Ranexa [ranolazine]; Atorvastatin; Ofloxacin; Other; Penicillins; and Pimozide   Social History   Tobacco Use  . Smoking status: Former Smoker    Packs/day: 1.00    Years: 64.00    Pack years: 64.00    Types: Cigarettes    Start date: 12/24/1947    Last attempt to quit: 11/17/2001    Years since quitting: 16.7  . Smokeless tobacco: Never Used  . Tobacco comment: Quit smoking in 2003  Substance Use Topics  .  Alcohol use: No    Alcohol/week: 0.0 standard drinks  . Drug use: No     Family Hx: The patient's family history includes Cancer in an other family member; Coronary artery disease in an other family member; Diabetes in her brother and son; Heart disease in her son; Hypertension in an other family member; Parkinson's disease in her father; Stroke in her mother and another family member; Stroke (age of onset: 72) in her daughter. There is no history of Colon cancer.  ROS:  Please see the history of present illness.     All other systems reviewed and are negative.   Prior CV studies:   The following studies were reviewed today:  TEE: 09/2017 Study Conclusions  - Left ventricle: The cavity size was normal. Systolic function was mildly to moderately reduced. The estimated ejection fraction was in the range of 40% to 45%. Diffuse hypokinesis. There was a thrombus. - Aortic valve: Trileaflet; mildly thickened leaflets. There was mild regurgitation. - Mitral valve: There was mild regurgitation. - Left atrium: The atrium was moderately to severely dilated. No evidence of thrombus in the atrial cavity or appendage. No evidence of thrombus in the atrial cavity or appendage. Pulsed Doppler intracavitary velocities were low normal. There was &quot;smoke&quot; seen in the appendage and left atrium indicating sluggish flow. - Right ventricle: Systolic function was mildly reduced. - Atrial septum: No defect or patent foramen ovale was identified.  Impressions:  - Successful cardioversion. No intracardiac thrombus.  Cardiac Catheterization: 05/16/2018  Patent short left main  20 to 30% proximal LAD irregularity, with diffuse 20% narrowing within the mid vessel.  Patent proximal circumflex stent with mid to distal circumflex irregularities up to 30 to 40%.  Nondominant right coronary with ostial 90% narrowing.  No hemodynamics recorded. Left ventriculography  was not performed in an effort to decrease contrast exposure in the setting of CKD.  RECOMMENDATIONS:   Compared to prior angiography in 2017, no significant changes noted.  Chest pain is atypical and may be more musculoskeletal or pleuritic.  Resume Eliquis in 12 hours/a.m. of 05/17/2018  Labs/Other Tests and Data Reviewed:    EKG:  No ECG reviewed.  Recent Labs: 09/18/2017: B Natriuretic Peptide 512.0 12/30/2017: Magnesium 2.1 02/22/2018: TSH 7.002 06/29/2018: ALT 27; BUN 19; Creatinine, Ser 1.38; Hemoglobin 11.1; Platelets 299; Potassium 4.4; Sodium 137   Recent Lipid Panel Lab Results  Component Value Date/Time   CHOL 177 12/16/2015 04:35 AM   TRIG 197 (H) 12/16/2015 04:35 AM   HDL 35 (L) 12/16/2015 04:35 AM   CHOLHDL 5.1 12/16/2015 04:35 AM   LDLCALC 103 (H) 12/16/2015 04:35 AM    Wt Readings from Last 3 Encounters:  08/15/18 162 lb (73.5 kg)  06/29/18 161 lb (73 kg)  06/05/18 166 lb 12.8 oz (75.7 kg)     Objective:    Vital Signs:  BP 125/66   Pulse 78   Ht 5\' 1"  (1.549 m)   Wt 162 lb (73.5 kg)   SpO2 91%   BMI 30.61 kg/m    VITAL SIGNS:  reviewed  ASSESSMENT & PLAN:    1. CAD: Most recent cath reviewed above with no significant changes since 2017. No ASA given need for anticoagulation. Continue Imdur and beta blocker. Statin intolerant.  2. Chronic combined heart failure: LVEF 40-45%. Continue lisinopril, Toprol-XL and Lasix 40 mg daily.  3. Persistent atrial fibrillation: PCP stopped amiodarone. Remains on Toprol-XL and Eliquis. Normal LFT's on 06/29/18. She has moderate to severe left atrial dilatation and is at high risk for recurrence. She has undergone cardioversions in both June and October 2019.  4. HLD:  Followed by PCP. She has been intolerant to multiple statins including low-dose Crestor. Not interested in PCSK-9 inhibitor therapy at this time.  5. CKD stage III: SCr 1.38 on 06/29/18.    COVID-19 Education: The signs and symptoms of COVID-19  were discussed with the patient and how to seek care for testing (follow up with PCP or arrange E-visit).  The importance of  social distancing was discussed today.  Time:   Today, I have spent 15 minutes with the patient with telehealth technology discussing the above problems.     Medication Adjustments/Labs and Tests Ordered: Current medicines are reviewed at length with the patient today.  Concerns regarding medicines are outlined above.   Tests Ordered: No orders of the defined types were placed in this encounter.   Medication Changes: No orders of the defined types were placed in this encounter.   Disposition:  Follow up in 6 month(s)  Signed, Kate Sable, MD  08/15/2018 4:59 PM    [facility]

## 2018-08-15 NOTE — Patient Instructions (Signed)

## 2018-09-11 DIAGNOSIS — E782 Mixed hyperlipidemia: Secondary | ICD-10-CM | POA: Diagnosis not present

## 2018-09-11 DIAGNOSIS — J449 Chronic obstructive pulmonary disease, unspecified: Secondary | ICD-10-CM | POA: Diagnosis not present

## 2018-09-11 DIAGNOSIS — E039 Hypothyroidism, unspecified: Secondary | ICD-10-CM | POA: Diagnosis not present

## 2018-09-11 DIAGNOSIS — I10 Essential (primary) hypertension: Secondary | ICD-10-CM | POA: Diagnosis not present

## 2018-09-11 DIAGNOSIS — K219 Gastro-esophageal reflux disease without esophagitis: Secondary | ICD-10-CM | POA: Diagnosis not present

## 2018-09-11 DIAGNOSIS — D649 Anemia, unspecified: Secondary | ICD-10-CM | POA: Diagnosis not present

## 2018-09-11 DIAGNOSIS — E876 Hypokalemia: Secondary | ICD-10-CM | POA: Diagnosis not present

## 2018-09-11 DIAGNOSIS — R739 Hyperglycemia, unspecified: Secondary | ICD-10-CM | POA: Diagnosis not present

## 2018-09-11 DIAGNOSIS — E871 Hypo-osmolality and hyponatremia: Secondary | ICD-10-CM | POA: Diagnosis not present

## 2018-09-12 DIAGNOSIS — E039 Hypothyroidism, unspecified: Secondary | ICD-10-CM | POA: Diagnosis not present

## 2018-09-12 DIAGNOSIS — L219 Seborrheic dermatitis, unspecified: Secondary | ICD-10-CM | POA: Diagnosis not present

## 2018-09-12 DIAGNOSIS — M109 Gout, unspecified: Secondary | ICD-10-CM | POA: Diagnosis not present

## 2018-09-12 DIAGNOSIS — H6092 Unspecified otitis externa, left ear: Secondary | ICD-10-CM | POA: Diagnosis not present

## 2018-09-12 DIAGNOSIS — Z6830 Body mass index (BMI) 30.0-30.9, adult: Secondary | ICD-10-CM | POA: Diagnosis not present

## 2018-09-25 ENCOUNTER — Telehealth: Payer: Self-pay | Admitting: Cardiovascular Disease

## 2018-09-25 DIAGNOSIS — I5042 Chronic combined systolic (congestive) and diastolic (congestive) heart failure: Secondary | ICD-10-CM

## 2018-09-25 NOTE — Telephone Encounter (Signed)
SOB - has all the time, but is getting more bothersome.  Worsening the last 3 days with activity.  02 sat was 95%.  Numbness in legs & feet is not new, had for over a year.  Now states is affecting her left arm.  Chest heaviness off/on x 2-3 days.  States she is not having active chest pain.  States there is no use in her going to the emergency room, never does anything to help her.  130/70  81  Does notice increase in heart rate with doing any activity.  Has not had to use her Nitroglycerin.  Weight - 165lb.  Feels like she has extra weight around her abdomen now.  Will forward message to provider for advice.  Highly suggested she go to ED for evaluation in the meantime if symptoms worsen.

## 2018-09-25 NOTE — Telephone Encounter (Signed)
Patient called stating that she is having a lot of shortness of breath with numbness to her left arm and shoulder. Patient states that she is getting worse.

## 2018-09-26 NOTE — Telephone Encounter (Signed)
Have her increase Lasix to 40 mg bid x 3 days, then go back to 40 mg daily. Check BMET this upcoming Monday. Have her call us next week to update [place] on symptoms.

## 2018-09-27 NOTE — Telephone Encounter (Signed)
Patient notified.  She will do lab at [facility] on Tuesday, 10/01/18 & call back mid next week with update.

## 2018-10-01 ENCOUNTER — Inpatient Hospital Stay (HOSPITAL_COMMUNITY)
Admission: EM | Admit: 2018-10-01 | Discharge: 2018-10-04 | DRG: 683 | Disposition: A | Discharge status: Home / Self Care | Payer: Medicare Other | Attending: Internal Medicine | Admitting: Internal Medicine

## 2018-10-01 ENCOUNTER — Emergency Department (HOSPITAL_COMMUNITY): Payer: Medicare Other

## 2018-10-01 ENCOUNTER — Ambulatory Visit: Payer: Medicare Other | Admitting: Urology

## 2018-10-01 ENCOUNTER — Encounter (HOSPITAL_COMMUNITY): Payer: Self-pay | Admitting: *Deleted

## 2018-10-01 ENCOUNTER — Other Ambulatory Visit: Payer: Self-pay

## 2018-10-01 DIAGNOSIS — N390 Urinary tract infection, site not specified: Secondary | ICD-10-CM | POA: Diagnosis present

## 2018-10-01 DIAGNOSIS — Z96651 Presence of right artificial knee joint: Secondary | ICD-10-CM | POA: Diagnosis present

## 2018-10-01 DIAGNOSIS — T464X5A Adverse effect of angiotensin-converting-enzyme inhibitors, initial encounter: Secondary | ICD-10-CM | POA: Diagnosis present

## 2018-10-01 DIAGNOSIS — B9620 Unspecified Escherichia coli [E. coli] as the cause of diseases classified elsewhere: Secondary | ICD-10-CM | POA: Diagnosis not present

## 2018-10-01 DIAGNOSIS — N183 Chronic kidney disease, stage 3 (moderate): Secondary | ICD-10-CM | POA: Diagnosis present

## 2018-10-01 DIAGNOSIS — T501X5A Adverse effect of loop [high-ceiling] diuretics, initial encounter: Secondary | ICD-10-CM | POA: Diagnosis present

## 2018-10-01 DIAGNOSIS — R079 Chest pain, unspecified: Secondary | ICD-10-CM | POA: Diagnosis not present

## 2018-10-01 DIAGNOSIS — R0789 Other chest pain: Secondary | ICD-10-CM | POA: Diagnosis present

## 2018-10-01 DIAGNOSIS — R740 Nonspecific elevation of levels of transaminase and lactic acid dehydrogenase [LDH]: Secondary | ICD-10-CM | POA: Diagnosis not present

## 2018-10-01 DIAGNOSIS — M109 Gout, unspecified: Secondary | ICD-10-CM | POA: Diagnosis present

## 2018-10-01 DIAGNOSIS — K219 Gastro-esophageal reflux disease without esophagitis: Secondary | ICD-10-CM | POA: Diagnosis present

## 2018-10-01 DIAGNOSIS — Z20828 Contact with and (suspected) exposure to other viral communicable diseases: Secondary | ICD-10-CM | POA: Diagnosis present

## 2018-10-01 DIAGNOSIS — I77811 Abdominal aortic ectasia: Secondary | ICD-10-CM | POA: Diagnosis not present

## 2018-10-01 DIAGNOSIS — J449 Chronic obstructive pulmonary disease, unspecified: Secondary | ICD-10-CM | POA: Diagnosis present

## 2018-10-01 DIAGNOSIS — E039 Hypothyroidism, unspecified: Secondary | ICD-10-CM | POA: Diagnosis present

## 2018-10-01 DIAGNOSIS — F419 Anxiety disorder, unspecified: Secondary | ICD-10-CM | POA: Diagnosis present

## 2018-10-01 DIAGNOSIS — R748 Abnormal levels of other serum enzymes: Secondary | ICD-10-CM | POA: Diagnosis present

## 2018-10-01 DIAGNOSIS — I959 Hypotension, unspecified: Secondary | ICD-10-CM | POA: Diagnosis present

## 2018-10-01 DIAGNOSIS — Z8249 Family history of ischemic heart disease and other diseases of the circulatory system: Secondary | ICD-10-CM

## 2018-10-01 DIAGNOSIS — Z87891 Personal history of nicotine dependence: Secondary | ICD-10-CM

## 2018-10-01 DIAGNOSIS — N179 Acute kidney failure, unspecified: Principal | ICD-10-CM

## 2018-10-01 DIAGNOSIS — R7401 Elevation of levels of liver transaminase levels: Secondary | ICD-10-CM

## 2018-10-01 DIAGNOSIS — R0602 Shortness of breath: Secondary | ICD-10-CM | POA: Diagnosis not present

## 2018-10-01 DIAGNOSIS — Z833 Family history of diabetes mellitus: Secondary | ICD-10-CM

## 2018-10-01 DIAGNOSIS — I252 Old myocardial infarction: Secondary | ICD-10-CM

## 2018-10-01 DIAGNOSIS — I480 Paroxysmal atrial fibrillation: Secondary | ICD-10-CM | POA: Diagnosis not present

## 2018-10-01 DIAGNOSIS — Z820 Family history of epilepsy and other diseases of the nervous system: Secondary | ICD-10-CM

## 2018-10-01 DIAGNOSIS — R197 Diarrhea, unspecified: Secondary | ICD-10-CM | POA: Diagnosis present

## 2018-10-01 DIAGNOSIS — Z955 Presence of coronary angioplasty implant and graft: Secondary | ICD-10-CM

## 2018-10-01 DIAGNOSIS — E860 Dehydration: Secondary | ICD-10-CM | POA: Diagnosis not present

## 2018-10-01 DIAGNOSIS — F329 Major depressive disorder, single episode, unspecified: Secondary | ICD-10-CM | POA: Diagnosis present

## 2018-10-01 DIAGNOSIS — I719 Aortic aneurysm of unspecified site, without rupture: Secondary | ICD-10-CM | POA: Diagnosis not present

## 2018-10-01 DIAGNOSIS — Z823 Family history of stroke: Secondary | ICD-10-CM

## 2018-10-01 DIAGNOSIS — I5032 Chronic diastolic (congestive) heart failure: Secondary | ICD-10-CM | POA: Diagnosis not present

## 2018-10-01 DIAGNOSIS — I130 Hypertensive heart and chronic kidney disease with heart failure and stage 1 through stage 4 chronic kidney disease, or unspecified chronic kidney disease: Secondary | ICD-10-CM | POA: Diagnosis present

## 2018-10-01 DIAGNOSIS — I739 Peripheral vascular disease, unspecified: Secondary | ICD-10-CM

## 2018-10-01 DIAGNOSIS — I2510 Atherosclerotic heart disease of native coronary artery without angina pectoris: Secondary | ICD-10-CM | POA: Diagnosis present

## 2018-10-01 DIAGNOSIS — Z7901 Long term (current) use of anticoagulants: Secondary | ICD-10-CM

## 2018-10-01 LAB — CBC WITH DIFFERENTIAL/PLATELET
Abs Immature Granulocytes: 0.08 10*3/uL — ABNORMAL HIGH (ref 0.00–0.07)
Basophils Absolute: 0.1 10*3/uL (ref 0.0–0.1)
Basophils Relative: 1 %
Eosinophils Absolute: 0.2 10*3/uL (ref 0.0–0.5)
Eosinophils Relative: 2 %
HCT: 44.2 % (ref 36.0–46.0)
Hemoglobin: 14.7 g/dL (ref 12.0–15.0)
Immature Granulocytes: 1 %
Lymphocytes Relative: 30 %
Lymphs Abs: 2.3 10*3/uL (ref 0.7–4.0)
MCH: 30.6 pg (ref 26.0–34.0)
MCHC: 33.3 g/dL (ref 30.0–36.0)
MCV: 92.1 fL (ref 80.0–100.0)
Monocytes Absolute: 0.6 10*3/uL (ref 0.1–1.0)
Monocytes Relative: 8 %
Neutro Abs: 4.5 10*3/uL (ref 1.7–7.7)
Neutrophils Relative %: 58 %
Platelets: 265 10*3/uL (ref 150–400)
RBC: 4.8 MIL/uL (ref 3.87–5.11)
RDW: 14.4 % (ref 11.5–15.5)
WBC: 7.7 10*3/uL (ref 4.0–10.5)
nRBC: 0 % (ref 0.0–0.2)

## 2018-10-01 LAB — COMPREHENSIVE METABOLIC PANEL
ALT: 62 U/L — ABNORMAL HIGH (ref 0–44)
AST: 72 U/L — ABNORMAL HIGH (ref 15–41)
Albumin: 4.2 g/dL (ref 3.5–5.0)
Alkaline Phosphatase: 241 U/L — ABNORMAL HIGH (ref 38–126)
Anion gap: 12 (ref 5–15)
BUN: 39 mg/dL — ABNORMAL HIGH (ref 8–23)
CO2: 20 mmol/L — ABNORMAL LOW (ref 22–32)
Calcium: 9.2 mg/dL (ref 8.9–10.3)
Chloride: 101 mmol/L (ref 98–111)
Creatinine, Ser: 2.6 mg/dL — ABNORMAL HIGH (ref 0.44–1.00)
GFR calc Af Amer: 19 mL/min — ABNORMAL LOW (ref >60)
GFR calc non Af Amer: 16 mL/min — ABNORMAL LOW (ref >60)
Glucose, Bld: 97 mg/dL (ref 70–99)
Potassium: 4.2 mmol/L (ref 3.5–5.1)
Sodium: 133 mmol/L — ABNORMAL LOW (ref 135–145)
Total Bilirubin: 0.9 mg/dL (ref 0.3–1.2)
Total Protein: 7.5 g/dL (ref 6.5–8.1)

## 2018-10-01 LAB — URINALYSIS, ROUTINE W REFLEX MICROSCOPIC
Bilirubin Urine: NEGATIVE
Glucose, UA: NEGATIVE mg/dL
Ketones, ur: NEGATIVE mg/dL
Nitrite: NEGATIVE
Protein, ur: 100 mg/dL — AB
Specific Gravity, Urine: 1.015 (ref 1.005–1.030)
WBC, UA: >50 WBC/hpf — ABNORMAL HIGH (ref 0–5)
pH: 5 (ref 5.0–8.0)

## 2018-10-01 LAB — TROPONIN I
Troponin I: <0.03 ng/mL (ref <0.03)
Troponin I: <0.03 ng/mL (ref <0.03)

## 2018-10-01 LAB — C DIFFICILE QUICK SCREEN W PCR REFLEX
C Diff antigen: NEGATIVE
C Diff interpretation: NOT DETECTED
C Diff toxin: NEGATIVE

## 2018-10-01 LAB — MAGNESIUM: Magnesium: 2.1 mg/dL (ref 1.7–2.4)

## 2018-10-01 LAB — SARS CORONAVIRUS 2 BY RT PCR (HOSPITAL ORDER, PERFORMED IN ~~LOC~~ HOSPITAL LAB): SARS Coronavirus 2: NEGATIVE

## 2018-10-01 LAB — BRAIN NATRIURETIC PEPTIDE: B Natriuretic Peptide: 54 pg/mL (ref 0.0–100.0)

## 2018-10-01 MED ORDER — IPRATROPIUM-ALBUTEROL 0.5-2.5 (3) MG/3ML IN SOLN: 3.0000 mL | RESPIRATORY_TRACT | Status: DC | PRN

## 2018-10-01 MED ORDER — AZTREONAM 1 G IJ SOLR: 1.0000 g | Freq: Three times a day (TID) | INTRAMUSCULAR | Status: DC

## 2018-10-01 MED ORDER — ONDANSETRON HCL 4 MG PO TABS: 4.0000 mg | ORAL_TABLET | Freq: Four times a day (QID) | ORAL | Status: DC | PRN

## 2018-10-01 MED ORDER — NITROGLYCERIN 0.4 MG SL SUBL: 0.4000 mg | SUBLINGUAL_TABLET | SUBLINGUAL | Status: DC | PRN

## 2018-10-01 MED ORDER — ACETAMINOPHEN 325 MG PO TABS
650.0000 mg | ORAL_TABLET | Freq: Four times a day (QID) | ORAL | Status: DC | PRN
  Administered 2018-10-04: 650 mg via ORAL
  Filled 2018-10-01: qty 2

## 2018-10-01 MED ORDER — SODIUM CHLORIDE 0.9 % IV SOLN
INTRAVENOUS | Status: DC
  Administered 2018-10-01 – 2018-10-02 (×2): via INTRAVENOUS

## 2018-10-01 MED ORDER — POLYETHYLENE GLYCOL 3350 17 G PO PACK: 17.0000 g | PACK | Freq: Every day | ORAL | Status: DC | PRN

## 2018-10-01 MED ORDER — APIXABAN 5 MG PO TABS: 5.0000 mg | ORAL_TABLET | Freq: Two times a day (BID) | ORAL | Status: DC

## 2018-10-01 MED ORDER — ALPRAZOLAM 0.25 MG PO TABS: 0.2500 mg | ORAL_TABLET | Freq: Two times a day (BID) | ORAL | Status: DC | PRN

## 2018-10-01 MED ORDER — ACETAMINOPHEN 650 MG RE SUPP: 650.0000 mg | Freq: Four times a day (QID) | RECTAL | Status: DC | PRN

## 2018-10-01 MED ORDER — METOPROLOL SUCCINATE ER 50 MG PO TB24: 50.0000 mg | ORAL_TABLET | Freq: Every day | ORAL | Status: DC

## 2018-10-01 MED ORDER — ONDANSETRON HCL 4 MG/2ML IJ SOLN: 4.0000 mg | Freq: Four times a day (QID) | INTRAMUSCULAR | Status: DC | PRN

## 2018-10-01 MED ORDER — APIXABAN 2.5 MG PO TABS
2.5000 mg | ORAL_TABLET | Freq: Two times a day (BID) | ORAL | Status: DC
  Administered 2018-10-01 – 2018-10-04 (×6): 2.5 mg via ORAL
  Filled 2018-10-01 (×6): qty 1

## 2018-10-01 MED ORDER — SODIUM CHLORIDE 0.9 % IV SOLN
1.0000 g | Freq: Three times a day (TID) | INTRAVENOUS | Status: DC
  Administered 2018-10-01 – 2018-10-02 (×3): 1 g via INTRAVENOUS
  Filled 2018-10-01 (×8): qty 1

## 2018-10-01 MED ORDER — SODIUM CHLORIDE 0.9 % IV BOLUS
250.0000 mL | Freq: Once | INTRAVENOUS | Status: AC
  Administered 2018-10-01: 250 mL via INTRAVENOUS

## 2018-10-01 NOTE — ED Provider Notes (Signed)
Medical screening examination/treatment/procedure(s) were conducted as a shared visit with non-physician practitioner(s) and myself.  I personally evaluated the patient during the encounter.  EKG Interpretation  Date/Time:  Tuesday October 01 2018 11:52:31 EDT Ventricular Rate:  66 PR Interval:    QRS Duration: 88 QT Interval:  451 QTC Calculation: 473 R Axis:   -19 Text Interpretation:  Sinus rhythm Short PR interval Borderline left axis deviation Anteroseptal infarct, age indeterminate Baseline wander in lead(s) III aVL Artifact No significant change since last tracing Confirmed by Fredia Sorrow [phone]) on 10/01/2018 12:04:40 PM   Patient seen by me along with physician assistant.  Patient presented today with a complaint of intermittent chest pain that started a week ago.  Sharp in nature.  Worse than normal and shortness of breath started on Thursday.  Patient has a history of coronary artery disease and cardiac stents.  Patient with also the complaint of dizziness nausea.  Work-up for the chest pain without any acute findings.  However patient's labs showed some liver function test abnormalities based on that we will do CT scan of the abdomen.  Also would recommend delta troponin.  Patient's renal function shows market change in BUN and creatinine this is consistent with acute kidney injury.  Could be due to her diuretics.  Patient will require admission for that.   Results for orders placed or performed during the hospital encounter of 10/01/18  CBC with Differential  Result Value Ref Range   WBC 7.7 4.0 - 10.5 K/uL   RBC 4.80 3.87 - 5.11 MIL/uL   Hemoglobin 14.7 12.0 - 15.0 g/dL   HCT 44.2 36.0 - 46.0 %   MCV 92.1 80.0 - 100.0 fL   MCH 30.6 26.0 - 34.0 pg   MCHC 33.3 30.0 - 36.0 g/dL   RDW 14.4 11.5 - 15.5 %   Platelets 265 150 - 400 K/uL   nRBC 0.0 0.0 - 0.2 %   Neutrophils Relative % 58 %   Neutro Abs 4.5 1.7 - 7.7 K/uL   Lymphocytes Relative 30 %   Lymphs Abs 2.3 0.7 - 4.0  K/uL   Monocytes Relative 8 %   Monocytes Absolute 0.6 0.1 - 1.0 K/uL   Eosinophils Relative 2 %   Eosinophils Absolute 0.2 0.0 - 0.5 K/uL   Basophils Relative 1 %   Basophils Absolute 0.1 0.0 - 0.1 K/uL   Immature Granulocytes 1 %   Abs Immature Granulocytes 0.08 (H) 0.00 - 0.07 K/uL  Comprehensive metabolic panel  Result Value Ref Range   Sodium 133 (L) 135 - 145 mmol/L   Potassium 4.2 3.5 - 5.1 mmol/L   Chloride 101 98 - 111 mmol/L   CO2 20 (L) 22 - 32 mmol/L   Glucose, Bld 97 70 - 99 mg/dL   BUN 39 (H) 8 - 23 mg/dL   Creatinine, Ser 2.60 (H) 0.44 - 1.00 mg/dL   Calcium 9.2 8.9 - 10.3 mg/dL   Total Protein 7.5 6.5 - 8.1 g/dL   Albumin 4.2 3.5 - 5.0 g/dL   AST 72 (H) 15 - 41 U/L   ALT 62 (H) 0 - 44 U/L   Alkaline Phosphatase 241 (H) 38 - 126 U/L   Total Bilirubin 0.9 0.3 - 1.2 mg/dL   GFR calc non Af Amer 16 (L) >60 mL/min   GFR calc Af Amer 19 (L) >60 mL/min   Anion gap 12 5 - 15  Brain natriuretic peptide  Result Value Ref Range   B Natriuretic  Peptide 54.0 0.0 - 100.0 pg/mL  Troponin I - Once  Result Value Ref Range   Troponin I <0.03 <0.03 ng/mL      Fredia Sorrow, MD 10/01/18 1452

## 2018-10-01 NOTE — ED Triage Notes (Addendum)
Pt c/o sharp, intermittent chest pain that started 1 week ago. Pt also c/o worse than normal SOB that started Thursday. Pt has hx of cardiac stents. Pt also c/o dizziness, nausea and dizziness.

## 2018-10-01 NOTE — ED Notes (Signed)
Chest pain worse with inspiration

## 2018-10-01 NOTE — H&P (Addendum)
History and Physical    [name]:[id number] DOB: 01/09/1933 DOA: 10/01/2018  PCP: Tonya Kaufman, PA-C   Patient coming from: Home  I have personally briefly reviewed patient's old medical records in [place]  Chief Complaint: Chest pain, SOB  HPI: [name] is a 85 y.o. female with medical history significant for coronary artery disease, diastolic CHF, paroxysmal atrial fibrillation, CKD 3, hypertension and COPD who presented to the ED with complaints of 1 week of intermittent chest pain left-sided-just beneath her left breast, radiating down her left arm with numbness with associated difficulty breathing and nausea.  Pain is described as pressure-like.  Chest pain and difficulty breathing are provoked by activity, relieved by rest.  Patient said over the past week any little activity provokes her symptoms. She reports multiple episodes day and night of nonbloody watery stools over the past 2 weeks.  She had 5 episodes of watery stools yesterday.  She reports nausea without vomiting.  She also endorses mild suprapubic abdominal pain with pain with urination. She reports some mild lower extremity swelling. She reports a weight of 165 2 weeks ago, and 150 yesterday.  She reports compliance with low-salt diet.  Also endorses compliance with her Eliquis twice a day.  Patient called her cardiologist 6/10, as she felt like she had extra weight around her abdomen, in addition to her chest pain symptoms.  Increase Lasix dose of 40 mg twice daily for 3 days was recommended.  And is now back to her regular dose of lasix.   ED Course: Blood pressure systolic 92 to 465K, O2 sats greater than 99% on room air.  Creatinine 2.6, from baseline 1.3-1.5.  BNP 54.  Troponin less than 0.03.  Mild elevated liver enzymes AST 72, ALT 62, ALP 241, with normal bilirubin 0.9.  Second abdomen and pelvic CT without contrast-no acute abnormality, ectatic abdominal aorta at risk for aneurysm  development. Portable chest x-ray negative for acute abnormality.  EKG had artifacts but no significant abnormalities.  Patient given 250 mill normal saline in the ED hospitalist to admit for acute kidney injury.  Review of Systems: As per HPI all other systems reviewed and negative.  Past Medical History:  Diagnosis Date  . Anxiety   . Arthritis   . Atrial fibrillation ([place])   . Bursitis    Left shoulder  . Cataract   . CHF (congestive heart failure) ([place])   . COPD (chronic obstructive pulmonary disease) ([place])   . Coronary atherosclerosis of native coronary artery    a. s/p DES to LCx in 04/2013 b. cath in 11/2015 showing patent stent with 20% prox-LAD and 80% ostial RCA stenosis for which medical management was recommended due to small artery size  . Depression   . Diastolic heart failure (HCC)    EF 55-60%  . Dysphagia, unspecified(787.20)   . Dyspnea   . Dysrhythmia   . Essential hypertension   . GERD (gastroesophageal reflux disease)    Hx Schatzki's ring, multiple EGD/ED last 01/06/2004  . Gout   . Headache   . History of anemia   . Hyperlipidemia   . Internal hemorrhoids without mention of complication   . MI (myocardial infarction) ([place]) 2006  . Microscopic colitis 2003  . Panic disorder without agoraphobia   . Paresthesia   . Pneumonia 12/2011  . PVD (peripheral vascular disease) ([place])   . S/P colonoscopy 09/27/2001   internal hemorrhoids, desc colon inflam polyp, SB BX-chronic duodenitis, colitis  .  Thyroid disease     Past Surgical History:  Procedure Laterality Date  . ABDOMINAL HYSTERECTOMY    . ABDOMINAL HYSTERECTOMY    . ANTERIOR AND POSTERIOR REPAIR     with resection of vagina  . ANTERIOR LAT LUMBAR FUSION N/A 08/01/2016   Procedure: Lumbar Two-Lumbar Five Transpsoas lateral interbody fusion with Lumbar Two-Three lateral plate fixation;  Surgeon: Kevan Ny Ditty, MD;  Location: [facility];  Service: Neurosurgery;  Laterality: N/A;  L2-5 Transpsoas lateral  interbody fusion with L2-3 lateral plate fixation  . APPENDECTOMY    . BACK SURGERY    . BIOPSY  07/05/2015   Procedure: BIOPSY;  Surgeon: Daneil Dolin, MD;  [facility];  Service: Endoscopy;;  gastric polyp biopsy, ascending colon biopsy  . BLADDER SUSPENSION  11/09/2011   Procedure: TRANSVAGINAL TAPE (TVT) PROCEDURE;  Surgeon: Marissa Nestle, MD;  Location: [facility];  Service: Urology;  Laterality: N/A;  . bladder tack  06/2010  . BREAST LUMPECTOMY  1998   left, benign  . CARDIAC CATHETERIZATION    . CARDIAC CATHETERIZATION    . CARDIAC CATHETERIZATION N/A 12/16/2015   Procedure: Left Heart Cath and Coronary Angiography;  Surgeon: Troy Sine, MD;  Location: [facility];  Service: Cardiovascular;  Laterality: N/A;  . CARDIOVERSION N/A 10/04/2017   Procedure: CARDIOVERSION;  Surgeon: Herminio Commons, MD;  Location: [facility];  Service: Cardiovascular;  Laterality: N/A;  . CARDIOVERSION N/A 01/30/2018   Procedure: CARDIOVERSION;  Surgeon: Herminio Commons, MD;  [facility];  Service: Cardiovascular;  Laterality: N/A;  . [place]   left  . cataract surgery    . CHOLECYSTECTOMY  1998  . Cholecystectomy    . COLONOSCOPY  03/16/2011   multiple hyperplastic colon polyps, sigmoid diverticulosis, melanosis coli  . COLONOSCOPY WITH PROPOFOL N/A 07/05/2015   RMR:one 5 mm polyp in descending colon  . CORONARY ANGIOGRAPHY N/A 05/16/2018   Procedure: CORONARY ANGIOGRAPHY (CATH LAB);  Surgeon: Belva Crome, MD;  Location: [facility];  Service: Cardiovascular;  Laterality: N/A;  . CORONARY ANGIOPLASTY WITH STENT PLACEMENT    . ESOPHAGEAL DILATION N/A 07/05/2015   Procedure: ESOPHAGEAL DILATION;  Surgeon: Daneil Dolin, MD;  [facility];  Service: Endoscopy;  Laterality: N/A;  . ESOPHAGOGASTRODUODENOSCOPY (EGD) WITH PROPOFOL N/A 07/05/2015   OXB:DZHGDJ  . JOINT REPLACEMENT Right 2007   right knee  . left hand surgery     . LEFT HEART CATHETERIZATION WITH CORONARY ANGIOGRAM N/A 05/14/2013   Procedure: LEFT HEART CATHETERIZATION WITH CORONARY ANGIOGRAM;  Surgeon: Blane Ohara, MD;  Location: [facility] CATH LAB;  Service: Cardiovascular;  Laterality: N/A;  . left rotator cuff surgery    . LUMBAR LAMINECTOMY/DECOMPRESSION MICRODISCECTOMY N/A 10/11/2012   Procedure: LUMBAR LAMINECTOMY/DECOMPRESSION MICRODISCECTOMY 2 LEVELS;  Surgeon: Floyce Stakes, MD;  Location: [facility];  Service: Neurosurgery;  Laterality: N/A;  L3-4 L4-5 Laminectomy  . LUMBAR WOUND DEBRIDEMENT N/A 09/27/2015   Procedure: Exploration of Lumbar Wound w/ Repair CSF Leak/Lumbar Drain Placement;  Surgeon: Leeroy Cha, MD;  Location: [facility];  Service: Neurosurgery;  Laterality: N/A;  . MALONEY DILATION  03/16/2011   Gastritis. No H.pylori on bx. 61F maloney dilation with disruption of  occult cevical esophageal web  . NASAL SINUS SURGERY    . right knee replacement  2007  . right leg benign tumor    . SHOULDER SURGERY Left   . TEE WITHOUT CARDIOVERSION  N/A 10/04/2017   Procedure: TRANSESOPHAGEAL ECHOCARDIOGRAM (TEE) WITH PROPOFOL;  Surgeon: Herminio Commons, MD;  Location: [facility];  Service: Cardiovascular;  Laterality: N/A;  . TONSILLECTOMY    . unspecified area, hysterectomy  1972   partial     reports that she quit smoking about 16 years ago. Her smoking use included cigarettes. She started smoking about 70 years ago. She has a 64.00 pack-year smoking history. She has never used smokeless tobacco. She reports that she does not drink alcohol or use drugs.  Allergies  Allergen Reactions  . Cephalosporins Diarrhea and Nausea Only    Lightheaded  . Levaquin [Levofloxacin In D5w] Swelling  . Macrodantin [Nitrofurantoin Macrocrystal] Swelling  . Phenothiazines Anaphylaxis and Hives  . Polysorbate Anaphylaxis  . Prednisone Shortness Of Breath  . Buspirone Itching  . Cardura [Doxazosin Mesylate] Itching  . Codeine Itching  .  Acyclovir And Related Itching    Redness of skin  . Prochlorperazine Other (See Comments)    "Upset stomach"  . Ranexa [Ranolazine]     Severe drop in BP  . Atorvastatin Hives    Cramping; tolerates Crestor ok  . Ofloxacin Rash  . Other Itching and Rash    "WOOL"= make skin look like it has been burned  . Penicillins Other (See Comments)    Causes redness all over. Has patient had a PCN reaction causing immediate rash, facial/tongue/throat swelling, SOB or lightheadedness with hypotension: No Has patient had a PCN reaction causing severe rash involving mucus membranes or skin necrosis: No Has patient had a PCN reaction that required hospitalization No Has patient had a PCN reaction occurring within the last 10 years: No If all of the above answers are "NO", then may proceed with Cephalosporin use.   . Pimozide Hives and Itching    Family History  Problem Relation Age of Onset  . Stroke Mother   . Parkinson's disease Father   . Coronary artery disease Other        family Hx-sons  . Cancer Other   . Stroke Other        family Hx  . Hypertension Other        family Hx  . Diabetes Brother   . Heart disease Son        before age 78  . Diabetes Son   . Stroke Daughter 77  . Colon cancer Neg Hx     Prior to Admission medications   Medication Sig Start Date End Date Taking? Authorizing Provider  acetaminophen (TYLENOL) 500 MG tablet Take 500 mg by mouth every 6 (six) hours as needed for headache.     [provider]  ALPRAZolam Duanne Moron) 0.25 MG tablet Take 0.25 mg by mouth 2 (two) times daily as needed for anxiety.  09/04/16   [provider]  apixaban (ELIQUIS) 5 MG TABS tablet Take 1 tablet (5 mg total) by mouth 2 (two) times daily. Resume with AM dose on 05/17/2018 05/17/18   Belva Crome, MD  colchicine 0.6 MG tablet Take 1 tablet (0.6 mg total) by mouth 2 (two) times daily. 06/29/18   Milton Ferguson, MD  DULoxetine (CYMBALTA) 60 MG capsule Take 60 mg by  mouth at bedtime.     [provider]  fluticasone (FLONASE) 50 MCG/ACT nasal spray Place 2 sprays into both nostrils 2 (two) times daily as needed for allergies.     [provider]  furosemide (LASIX) 40 MG tablet TAKE ONE TABLET BY MOUTH DAILY. Patient  taking differently: Take 40 mg by mouth daily.  04/02/18   Herminio Commons, MD  HYDROcodone-acetaminophen (NORCO/VICODIN) 5-325 MG tablet Take 1 tablet by mouth every 6 (six) hours as needed. 06/29/18   Milton Ferguson, MD  ipratropium-albuterol (DUONEB) 0.5-2.5 (3) MG/3ML SOLN Take 3 mLs by nebulization every 6 (six) hours. 12/18/15   Regalado, Belkys A, MD  isosorbide mononitrate (IMDUR) 60 MG 24 hr tablet Take 1 tablet (60 mg total) by mouth daily. 02/22/18   Herminio Commons, MD  levothyroxine (SYNTHROID, LEVOTHROID) 25 MCG tablet Take 1 tablet by mouth daily. 06/07/18   [provider]  lisinopril (PRINIVIL,ZESTRIL) 2.5 MG tablet TAKE ONE TABLET BY MOUTH DAILY. 06/19/18   Herminio Commons, MD  magnesium oxide (MAG-OX) 400 MG tablet TAKE ONE TABLET BY MOUTH DAILY. Patient taking differently: Take 400 mg by mouth daily.  05/03/18   Herminio Commons, MD  methocarbamol (ROBAXIN) 500 MG tablet Take 500 mg by mouth every 6 (six) hours as needed for muscle spasms.     [provider]  metoprolol succinate (TOPROL-XL) 50 MG 24 hr tablet Take 1 tablet (50 mg total) by mouth daily. Take with or immediately following a meal. 01/30/18 05/15/19  Herminio Commons, MD  Multiple Vitamin (MULTIVITAMIN WITH MINERALS) TABS tablet Take 1 tablet by mouth daily. Centrum     [provider]  nitroGLYCERIN (NITROSTAT) 0.4 MG SL tablet Place 1 tablet (0.4 mg total) under the tongue every 5 (five) minutes as needed for chest pain. Reported on 08/04/2015 11/20/17   Herminio Commons, MD  pantoprazole (PROTONIX) 40 MG tablet Take 40 mg by mouth daily.     [provider]  potassium chloride (K-DUR,KLOR-CON)  10 MEQ tablet TAKE TWO (2) TABLETS BY MOUTH DAILY. Patient taking differently: Take 20 mEq by mouth daily.  05/03/18   Herminio Commons, MD  sodium chloride (OCEAN) 0.65 % SOLN nasal spray Place 1 spray into both nostrils 2 (two) times daily.    [provider]    Physical Exam: Vitals:   10/01/18 1430 10/01/18 1500 10/01/18 1530 10/01/18 1600  BP: 92/65 (!) 126/49 113/61 (!) 97/59  Pulse: 63 62 62 65  Resp: 14 20 (!) 21 17  Temp:      TempSrc:      SpO2: 100% 99% 99% 100%  Weight:      Height:        Constitutional: NAD, calm, comfortable Vitals:   10/01/18 1430 10/01/18 1500 10/01/18 1530 10/01/18 1600  BP: 92/65 (!) 126/49 113/61 (!) 97/59  Pulse: 63 62 62 65  Resp: 14 20 (!) 21 17  Temp:      TempSrc:      SpO2: 100% 99% 99% 100%  Weight:      Height:       Eyes: PERRL, lids and conjunctivae normal ENMT: Mucous membranes are moist. Posterior pharynx clear of any exudate or lesions. Neck: normal, supple, no masses, no thyromegaly Respiratory: clear to auscultation bilaterally, no wheezing, no crackles. Normal respiratory effort. No accessory muscle use.  Cardiovascular: Regular rate and rhythm, no murmurs / rubs / gallops. No extremity edema. 2+ pedal pulses.  Abdomen: Minimal suprapubic tenderness, no masses palpated. No hepatosplenomegaly. Bowel sounds positive.  Musculoskeletal: no clubbing / cyanosis. No joint deformity upper and lower extremities. Good ROM, no contractures. Normal muscle tone.  Skin: no rashes, lesions, ulcers. No induration Neurologic: CN 2-12 grossly intact. Strength 5/5 in all 4.  Psychiatric: Normal judgment  and insight. Alert and oriented x 3. Normal mood.   Labs on Admission: I have personally reviewed following labs and imaging studies  CBC: Recent Labs  Lab 10/01/18 1227  WBC 7.7  NEUTROABS 4.5  HGB 14.7  HCT 44.2  MCV 92.1  PLT 308   Basic Metabolic Panel: Recent Labs  Lab 10/01/18 1227 10/01/18 1604  NA 133*  --    K 4.2  --   CL 101  --   CO2 20*  --   GLUCOSE 97  --   BUN 39*  --   CREATININE 2.60*  --   CALCIUM 9.2  --   MG  --  2.1   GFR: Estimated Creatinine Clearance: 14.7 mL/min (A) (by C-G formula based on SCr of 2.6 mg/dL (H)). Liver Function Tests: Recent Labs  Lab 10/01/18 1227  AST 72*  ALT 62*  ALKPHOS 241*  BILITOT 0.9  PROT 7.5  ALBUMIN 4.2   Cardiac Enzymes: Recent Labs  Lab 10/01/18 1227  TROPONINI <0.03    Radiological Exams on Admission: Ct Abdomen Pelvis Wo Contrast  Result Date: 10/01/2018 CLINICAL DATA:  Abnormal liver function tests. Abdominal discomfort nausea and diarrhea. Chest pain. EXAM: CT ABDOMEN AND PELVIS WITHOUT CONTRAST TECHNIQUE: Multidetector CT imaging of the abdomen and pelvis was performed following the standard protocol without IV contrast. COMPARISON:  CT scan dated 08/31/2011 FINDINGS: Lower chest: Extensive coronary artery calcification. Aortic atherosclerosis. Heart size is normal. Minimal chronic interstitial disease at the right lung base, slightly progressed. Hepatobiliary: Previous cholecystectomy. Two small calcified granulomas in the liver of no significance. Liver parenchyma is otherwise normal. No biliary ductal dilatation. Pancreas: Normal. Spleen: Normal. Adrenals/Urinary Tract: Normal adrenal glands. Bilateral renal atrophy. No mass lesions or hydronephrosis. Normal bladder. Stomach/Bowel: The stomach, small bowel, and colon appear normal. Appendix has been removed. Vascular/Lymphatic: No adenopathy. Extensive aortic atherosclerosis. Focal saccular dilatation of the distal abdominal aorta to a diameter of 2.8 cm, new since the study of 2013. Reproductive: Status post hysterectomy. No adnexal masses. Ovaries appear normal. Other: No abdominal wall hernia or ascites. Musculoskeletal: No acute abnormality. Lumbar fusion from L2 through L5. Posterior decompression at those levels. IMPRESSION: No acute abnormality of the abdomen or pelvis.  Aortic Atherosclerosis (ICD10-I70.0). Focal slight dilatation of the abdominal aorta to 2.8 cm. Ectatic abdominal aorta at risk for aneurysm development. Recommend followup by ultrasound in 5 years. This recommendation follows ACR consensus guidelines: White Paper of the ACR Incidental Findings Committee II on Vascular Findings. J Am Coll Radiol 2013; 10:789-794. Aortic aneurysm NOS (ICD10-I71.9) Electronically Signed   By: Lorriane Shire M.D.   On: 10/01/2018 15:34   Dg Chest Port 1 View  Result Date: 10/01/2018 CLINICAL DATA:  Chest pain, shortness of breath EXAM: PORTABLE CHEST 1 VIEW COMPARISON:  06/29/2018 FINDINGS: There is mild bilateral chronic interstitial thickening. There is no focal consolidation. There is no pleural effusion or pneumothorax. The heart and mediastinal contours are unremarkable. There is no acute osseous abnormality. IMPRESSION: No active disease. Electronically Signed   By: Kathreen Devoid   On: 10/01/2018 13:27    EKG: Independently reviewed.  Artifacts, sinus rhythm 66, no significant abnormality, repeat EKG shows slight distal ST segment/T wave abnormality in diffuse leads- II, AVF, V3- V6.   Assessment/Plan Active Problems:   AKI (acute kidney injury) ([place])   Acute kidney injury on CKD 3- creatinine 2.6, baseline 1.3-1.5.  BUN 39, likely prerenal 2/2 two weeks of diarrhea, diuretics and likely lisinopril contributing.  Blood pressure systolic down to 78G in the ED, hemoglobin 14.9 suggest hemoconcentration.  237ml N/s given in ED. - N/s 100cc/hr x 1 day -Hold home Lasix, lisinopril - BMP a.m -Check magnesium  Diarrhea -watery stools for 2 weeks.  Nausea. WBC 7.7.  Afebrile.  She was also recently started on colchicine. - Hydrate - Stool C. difficile, GI pathogen panel -Hold colchicine for now  Exertional chest pain with CAD hx with stent placement- Typical anginal symptoms. Troponin <0.03.  Initial EKG with artifacts, but repeat shows slight  Distal ST segment  /T wave abnormality in diffuse leads- II, AVF, V3- V6. Compliant with Eliquis-so doubt Pulmonary Embolism.  Follows with Dr. Max Sane.   Last cardiac cath 04/2018, proximal LAD irregularity, nondominant right coronary with ostial 90% narrowing, 30 to 40% mid to distal circumflex irregularities. Findings unchanged from 2017 cath. -Trend troponin -Continue home Eliquis, metoprolol -Not on statins,  Allergy listed -Repeat EKG in the morning -Will obtain echocardiogram -Cardiology consulted  - NPO midnight -Hold Imdur with soft blood pressure. -PRN nitro  UTI- Suprapubic pain, Dysuria. UA consistent with many bacteria, >50 WBCs, mod leukocytes. 7.  Afebrile. -Follow-up urine cultures -Extensive allergy list including penicillin and cephalosporins and quinolones.  Will use IV aztreonam  Elevated liver enzymes- AST 72, ALT 62, ALP 241.  Normal total bili 0.9.  Normal liver enzymes 3 months ago.  Abdominal CT negative for acute abnormality.  She is not on statins. -Hydrate and repeat liver enzymes in the morning -Acute hepatitis panel  Incidental CT finding- Ct abd+ pelvis Wo Contrast Ectatic abdominal aorta at risk for aneurysm development. Recommend followup by ultrasound in 5 years.  History of diastolic CHF-compensated.  BNP 54.  Chest x-ray clear.  Last echo- TEE 09/2017-45%, diffuse hypokinesis. -Hold Lasix for now with AKI -Continue home metoprolol   Paroxysmal atrial fibrillation-currently in sinus rhythm.  Rate controlled and anticoagulated. -Continue home metoprolol and Eliquis  Hypertension-hypotensive to soft blood pressure. -Hold home lisinopril, metoprolol, Imdur, Lasix and K-Dur supplements  COPD, Gout -stable.  No wheezing. Not on home oxygen.  -Hold colchicine for now  Hypothyroidism-  - Pending med rec resume home meds.  DVT prophylaxis: Eliquis Code Status: Full Family Communication: None at bedside Disposition Plan: Per rounding team Consults called: None  Admission status:  Obs, tele   Bethena Roys MD [facility]  10/01/2018, 4:58 PM

## 2018-10-01 NOTE — ED Notes (Signed)
Pt to CT

## 2018-10-01 NOTE — ED Provider Notes (Signed)
[facility] EMERGENCY DEPARTMENT Provider Note   CSN: [id number] Arrival date & time: 10/01/18  1139    History   Chief Complaint Chief Complaint  Patient presents with   Chest Pain   Shortness of Breath    HPI Tonya UNCAPHER is a 85 y.o. female with past medical history of atrial fibrillation on Eliquis, CAD, CHF, COPD, presenting to the emergency department with at least 1 week of shortness of breath that is worse with exertion.  She states she feels significantly short of breath after taking just a few steps.  She has associated chest heaviness at the same time.  She states she had a little bit of bilateral pedal edema few days ago, however that resolved.  Feels like this is her heart failure.  She is felt weak and a little bit lightheaded when she is mobile as well.  She has also had some nausea without vomiting.  She has been in contact with her cardiologist, Dr. Bronson Ing, who instructed her to increase her Lasix dose from 40 mg daily to 40 mg twice daily for 3 days.  She states she did this over the weekend and had no improvement in her symptoms.  No cough or congestion, no fevers, no abdominal pain.  No sick contacts.     The history is provided by the patient and medical records.    Past Medical History:  Diagnosis Date   Anxiety    Arthritis    Atrial fibrillation (HCC)    Bursitis    Left shoulder   Cataract    CHF (congestive heart failure) (HCC)    COPD (chronic obstructive pulmonary disease) (HCC)    Coronary atherosclerosis of native coronary artery    a. s/p DES to LCx in 04/2013 b. cath in 11/2015 showing patent stent with 20% prox-LAD and 80% ostial RCA stenosis for which medical management was recommended due to small artery size   Depression    Diastolic heart failure (HCC)    EF 55-60%   Dysphagia, unspecified(787.20)    Dyspnea    Dysrhythmia    Essential hypertension    GERD (gastroesophageal reflux disease)    Hx Schatzki's ring,  multiple EGD/ED last 01/06/2004   Gout    Headache    History of anemia    Hyperlipidemia    Internal hemorrhoids without mention of complication    MI (myocardial infarction) ([place]) 2006   Microscopic colitis 2003   Panic disorder without agoraphobia    Paresthesia    Pneumonia 12/2011   PVD (peripheral vascular disease) (HCC)    S/P colonoscopy 09/27/2001   internal hemorrhoids, desc colon inflam polyp, SB BX-chronic duodenitis, colitis   Thyroid disease     Patient Active Problem List   Diagnosis Date Noted   AKI (acute kidney injury) ([place]) 10/01/2018   Persistent atrial fibrillation 12/30/2017   Chronic combined systolic and diastolic congestive heart failure ([place]) 12/30/2017   Hyperammonemia ([place]) 02/20/2017   Myoclonic jerking 02/20/2017   Atrial fibrillation ([place]) 02/19/2017   Acute gout 40/98/1191   Chronic systolic heart failure ([place])    Chest pain 09/30/2016   Status post lumbar spine surgery for decompression of spinal cord 08/07/2016   Lumbosacral spondylosis with radiculopathy 08/01/2016   Preoperative clearance 07/05/2016   Cough 05/26/2016   Weakness 04/26/2016   Nausea without vomiting 04/26/2016   Atrial fibrillation with RVR ([place]) 04/15/2016   DOE (dyspnea on exertion) 03/20/2016   Hypoxia    Acute on chronic diastolic  congestive heart failure (HCC)    Chronic kidney disease, stage III (moderate) ([place]) 02/17/2016   Essential hypertension 02/17/2016   COPD exacerbation ([place]) 02/17/2016   Acute bronchitis 12/18/2015   CAD in native artery    Chest pain at rest 10/15/2015   Chronic low back pain 10/15/2015   Hyponatremia 10/15/2015   Hyperglycemia 10/15/2015   Thrombocytosis ([place]) 10/15/2015   Atypical chest pain 10/15/2015   Depression    Anxiety    Gastroesophageal reflux disease without esophagitis    Mild cognitive impairment 10/14/2015   Iron deficiency anemia due to chronic blood loss    Meningitis  10/01/2015   Coronary artery disease due to lipid rich plaque    NSVT (nonsustained ventricular tachycardia) (HCC)    PAF (paroxysmal atrial fibrillation) (HCC)    CSF leak 09/27/2015   Spondylolisthesis of lumbar region 09/24/2015   Gastric polyp    History of colonic polyps    Chronic diarrhea    Esophageal dysphagia 04/01/2015   PAOD (peripheral arterial occlusive disease) ([place]) 03/05/2015   Pain in the chest    Hyperlipidemia    Weight gain 08/12/2014   Anemia 08/07/2014   Hemorrhoids 08/06/2014   CHF (congestive heart failure) ([place]) 07/29/2014   Rectal bleeding 07/06/2014   Constipation 07/06/2014   Lower extremity edema 11/24/2013   Acute kidney failure ([place]) 11/24/2013   Hypokalemia 11/24/2013   Other and unspecified angina pectoris 05/14/2013   Hematochezia 02/13/2011   Abdominal pain 02/13/2011   Major depression ([place]) 09/28/2010   FATIGUE 04/13/2009   Chronic diastolic heart failure ([place]) 11/30/2008   DYSPNEA 11/30/2008   CAD, NATIVE VESSEL - PCI + DES to left circumflex 05/14/13 11/27/2008   Peripheral vascular disease ([place]) 11/27/2008   PANIC ATTACK 02/28/2008   MI 02/28/2008   Internal hemorrhoids 02/28/2008   COLITIS 02/28/2008   Dysphagia 02/28/2008    Past Surgical History:  Procedure Laterality Date   ABDOMINAL HYSTERECTOMY     ABDOMINAL HYSTERECTOMY     ANTERIOR AND POSTERIOR REPAIR     with resection of vagina   ANTERIOR LAT LUMBAR FUSION N/A 08/01/2016   Procedure: Lumbar Two-Lumbar Five Transpsoas lateral interbody fusion with Lumbar Two-Three lateral plate fixation;  Surgeon: Kevan Ny Ditty, MD;  Location: [place];  Service: Neurosurgery;  Laterality: N/A;  L2-5 Transpsoas lateral interbody fusion with L2-3 lateral plate fixation   APPENDECTOMY     BACK SURGERY     BIOPSY  07/05/2015   Procedure: BIOPSY;  Surgeon: Daneil Dolin, MD;  [facility];  Service: Endoscopy;;  gastric polyp biopsy,  ascending colon biopsy   BLADDER SUSPENSION  11/09/2011   Procedure: TRANSVAGINAL TAPE (TVT) PROCEDURE;  Surgeon: Marissa Nestle, MD;  Location: [facility];  Service: Urology;  Laterality: N/A;   bladder tack  06/2010   BREAST LUMPECTOMY  1998   left, benign   CARDIAC CATHETERIZATION     CARDIAC CATHETERIZATION     CARDIAC CATHETERIZATION N/A 12/16/2015   Procedure: Left Heart Cath and Coronary Angiography;  Surgeon: Troy Sine, MD;  Location: [place] [facility];  Service: Cardiovascular;  Laterality: N/A;   CARDIOVERSION N/A 10/04/2017   Procedure: CARDIOVERSION;  Surgeon: Herminio Commons, MD;  Location: [facility];  Service: Cardiovascular;  Laterality: N/A;   CARDIOVERSION N/A 01/30/2018   Procedure: CARDIOVERSION;  Surgeon: Herminio Commons, MD;  [facility];  Service: Cardiovascular;  Laterality: N/A;   [place]   left  cataract surgery     CHOLECYSTECTOMY  1998   Cholecystectomy     COLONOSCOPY  03/16/2011   multiple hyperplastic colon polyps, sigmoid diverticulosis, melanosis coli   COLONOSCOPY WITH PROPOFOL N/A 07/05/2015   RMR:one 5 mm polyp in descending colon   CORONARY ANGIOGRAPHY N/A 05/16/2018   Procedure: CORONARY ANGIOGRAPHY (CATH LAB);  Surgeon: Belva Crome, MD;  Location: [place] [facility];  Service: Cardiovascular;  Laterality: N/A;   CORONARY ANGIOPLASTY WITH STENT PLACEMENT     ESOPHAGEAL DILATION N/A 07/05/2015   Procedure: ESOPHAGEAL DILATION;  Surgeon: Daneil Dolin, MD;  [facility];  Service: Endoscopy;  Laterality: N/A;   ESOPHAGOGASTRODUODENOSCOPY (EGD) WITH PROPOFOL N/A 07/05/2015   [id number]   JOINT REPLACEMENT Right 2007   right knee   left hand surgery     LEFT HEART CATHETERIZATION WITH CORONARY ANGIOGRAM N/A 05/14/2013   Procedure: LEFT HEART CATHETERIZATION WITH CORONARY ANGIOGRAM;  Surgeon: Blane Ohara, MD;  Location: [facility] CATH LAB;  Service: Cardiovascular;  Laterality: N/A;    left rotator cuff surgery     LUMBAR LAMINECTOMY/DECOMPRESSION MICRODISCECTOMY N/A 10/11/2012   Procedure: LUMBAR LAMINECTOMY/DECOMPRESSION MICRODISCECTOMY 2 LEVELS;  Surgeon: Floyce Stakes, MD;  Location: [place] [facility] OR[facility];  Service: Neurosurgery;  Laterality: N/A;  L3-4 L4-5 Laminectomy   LUMBAR WOUND DEBRIDEMENT N/A 09/27/2015   Procedure: Exploration of Lumbar Wound w/ Repair CSF Leak/Lumbar Drain Placement;  Surgeon: Leeroy Cha, MD;  Location: [place] NEURO ORS;  Service: Neurosurgery;  Laterality: N/A;   MALONEY DILATION  03/16/2011   Gastritis. No H.pylori on bx. 75F maloney dilation with disruption of  occult cevical esophageal web   NASAL SINUS SURGERY     right knee replacement  2007   right leg benign tumor     SHOULDER SURGERY Left    TEE WITHOUT CARDIOVERSION N/A 10/04/2017   Procedure: TRANSESOPHAGEAL ECHOCARDIOGRAM (TEE) WITH PROPOFOL;  Surgeon: Herminio Commons, MD;  [facility];  Service: Cardiovascular;  Laterality: N/A;   TONSILLECTOMY     unspecified area, hysterectomy  1972   partial     OB History    Gravida  8   Para  6   Term      Preterm      AB  2   Living  5     SAB      TAB      Ectopic      Multiple      Live Births               Home Medications    Prior to Admission medications   Medication Sig Start Date End Date Taking? Authorizing Provider  acetaminophen (TYLENOL) 500 MG tablet Take 500 mg by mouth every 6 (six) hours as needed for headache.     [provider]  ALPRAZolam Duanne Moron) 0.25 MG tablet Take 0.25 mg by mouth 2 (two) times daily as needed for anxiety.  09/04/16   [provider]  apixaban (ELIQUIS) 5 MG TABS tablet Take 1 tablet (5 mg total) by mouth 2 (two) times daily. Resume with AM dose on 05/17/2018 05/17/18   Belva Crome, MD  colchicine 0.6 MG tablet Take 1 tablet (0.6 mg total) by mouth 2 (two) times daily. 06/29/18   Milton Ferguson, MD  DULoxetine (CYMBALTA) 60 MG capsule Take  60 mg by mouth at bedtime.     [provider]  fluticasone (FLONASE) 50 MCG/ACT nasal spray Place 2 sprays into both nostrils  2 (two) times daily as needed for allergies.     [provider]  furosemide (LASIX) 40 MG tablet TAKE ONE TABLET BY MOUTH DAILY. Patient taking differently: Take 40 mg by mouth daily.  04/02/18   Herminio Commons, MD  HYDROcodone-acetaminophen (NORCO/VICODIN) 5-325 MG tablet Take 1 tablet by mouth every 6 (six) hours as needed. 06/29/18   Milton Ferguson, MD  ipratropium-albuterol (DUONEB) 0.5-2.5 (3) MG/3ML SOLN Take 3 mLs by nebulization every 6 (six) hours. 12/18/15   Regalado, Belkys A, MD  isosorbide mononitrate (IMDUR) 60 MG 24 hr tablet Take 1 tablet (60 mg total) by mouth daily. 02/22/18   Herminio Commons, MD  levothyroxine (SYNTHROID, LEVOTHROID) 25 MCG tablet Take 1 tablet by mouth daily. 06/07/18   [provider]  lisinopril (PRINIVIL,ZESTRIL) 2.5 MG tablet TAKE ONE TABLET BY MOUTH DAILY. 06/19/18   Herminio Commons, MD  magnesium oxide (MAG-OX) 400 MG tablet TAKE ONE TABLET BY MOUTH DAILY. Patient taking differently: Take 400 mg by mouth daily.  05/03/18   Herminio Commons, MD  methocarbamol (ROBAXIN) 500 MG tablet Take 500 mg by mouth every 6 (six) hours as needed for muscle spasms.     [provider]  metoprolol succinate (TOPROL-XL) 50 MG 24 hr tablet Take 1 tablet (50 mg total) by mouth daily. Take with or immediately following a meal. 01/30/18 05/15/19  Herminio Commons, MD  Multiple Vitamin (MULTIVITAMIN WITH MINERALS) TABS tablet Take 1 tablet by mouth daily. Centrum     [provider]  nitroGLYCERIN (NITROSTAT) 0.4 MG SL tablet Place 1 tablet (0.4 mg total) under the tongue every 5 (five) minutes as needed for chest pain. Reported on 08/04/2015 11/20/17   Herminio Commons, MD  pantoprazole (PROTONIX) 40 MG tablet Take 40 mg by mouth daily.     [provider]  potassium chloride  (K-DUR,KLOR-CON) 10 MEQ tablet TAKE TWO (2) TABLETS BY MOUTH DAILY. Patient taking differently: Take 20 mEq by mouth daily.  05/03/18   Herminio Commons, MD  sodium chloride (OCEAN) 0.65 % SOLN nasal spray Place 1 spray into both nostrils 2 (two) times daily.    [provider]    Family History Family History  Problem Relation Age of Onset   Stroke Mother    Parkinson's disease Father    Coronary artery disease Other        family Hx-sons   Cancer Other    Stroke Other        family Hx   Hypertension Other        family Hx   Diabetes Brother    Heart disease Son        before age 59   Diabetes Son    Stroke Daughter 52   Colon cancer Neg Hx     Social History Social History   Tobacco Use   Smoking status: Former Smoker    Packs/day: 1.00    Years: 64.00    Pack years: 64.00    Types: Cigarettes    Start date: 12/24/1947    Quit date: 11/17/2001    Years since quitting: 16.8   Smokeless tobacco: Never Used   Tobacco comment: Quit smoking in 2003  Substance Use Topics   Alcohol use: No    Alcohol/week: 0.0 standard drinks   Drug use: No     Allergies   Cephalosporins, Levaquin [levofloxacin in d5w], Macrodantin [nitrofurantoin macrocrystal], Phenothiazines, Polysorbate, Prednisone, Buspirone, Cardura [doxazosin mesylate], Codeine, Acyclovir and related,  Prochlorperazine, Ranexa [ranolazine], Atorvastatin, Ofloxacin, Other, Penicillins, and Pimozide   Review of Systems Review of Systems  Constitutional: Negative for fever.  Respiratory: Positive for shortness of breath. Negative for cough.   Cardiovascular: Positive for chest pain and leg swelling.  Gastrointestinal: Positive for nausea. Negative for abdominal pain and vomiting.  Hematological: Bruises/bleeds easily.  All other systems reviewed and are negative.    Physical Exam Updated Vital Signs BP (!) 115/51    Pulse (!) 58    Temp 97.7 F (36.5 C) (Oral)    Resp 20    Ht 5\' 1"   (1.549 m)    Wt 70.5 kg    SpO2 100%    BMI 29.38 kg/m   Physical Exam Vitals signs and nursing note reviewed.  Constitutional:      General: She is not in acute distress.    Appearance: She is well-developed.  HENT:     Head: Normocephalic and atraumatic.  Eyes:     Conjunctiva/sclera: Conjunctivae normal.  Neck:     Musculoskeletal: Normal range of motion and neck supple.  Cardiovascular:     Rate and Rhythm: Normal rate and regular rhythm.  Pulmonary:     Effort: Pulmonary effort is normal. No respiratory distress.     Breath sounds: Normal breath sounds.     Comments: Speaking in full sentences. Abdominal:     General: Bowel sounds are normal.     Palpations: Abdomen is soft.     Tenderness: There is no abdominal tenderness. There is no guarding or rebound.  Musculoskeletal:     Right lower leg: No edema.     Left lower leg: No edema.  Skin:    General: Skin is warm.  Neurological:     Mental Status: She is alert.  Psychiatric:        Behavior: Behavior normal.      ED Treatments / Results  Labs (all labs ordered are listed, but only abnormal results are displayed) Labs Reviewed  CBC WITH DIFFERENTIAL/PLATELET - Abnormal; Notable for the following components:      Result Value   Abs Immature Granulocytes 0.08 (*)    All other components within normal limits  COMPREHENSIVE METABOLIC PANEL - Abnormal; Notable for the following components:   Sodium 133 (*)    CO2 20 (*)    BUN 39 (*)    Creatinine, Ser 2.60 (*)    AST 72 (*)    ALT 62 (*)    Alkaline Phosphatase 241 (*)    GFR calc non Af Amer 16 (*)    GFR calc Af Amer 19 (*)    All other components within normal limits  SARS CORONAVIRUS 2 (HOSPITAL ORDER, [place] [facility])  GASTROINTESTINAL PANEL BY PCR, STOOL (REPLACES STOOL CULTURE)  GASTROINTESTINAL PANEL BY PCR, STOOL (REPLACES STOOL CULTURE)  BRAIN NATRIURETIC PEPTIDE  TROPONIN I  MAGNESIUM  TROPONIN I  TROPONIN I  HEPATITIS  PANEL, ACUTE  URINALYSIS, ROUTINE W REFLEX MICROSCOPIC    EKG EKG Interpretation  Date/Time:  Tuesday October 01 2018 11:52:31 EDT Ventricular Rate:  66 PR Interval:    QRS Duration: 88 QT Interval:  451 QTC Calculation: 473 R Axis:   -19 Text Interpretation:  Sinus rhythm Short PR interval Borderline left axis deviation Anteroseptal infarct, age indeterminate Baseline wander in lead(s) III aVL Artifact No significant change since last tracing Confirmed by Fredia Sorrow [phone]) on 10/01/2018 12:04:40 PM   Radiology Ct Abdomen Pelvis Wo Contrast  Result Date: 10/01/2018 CLINICAL DATA:  Abnormal liver function tests. Abdominal discomfort nausea and diarrhea. Chest pain. EXAM: CT ABDOMEN AND PELVIS WITHOUT CONTRAST TECHNIQUE: Multidetector CT imaging of the abdomen and pelvis was performed following the standard protocol without IV contrast. COMPARISON:  CT scan dated 08/31/2011 FINDINGS: Lower chest: Extensive coronary artery calcification. Aortic atherosclerosis. Heart size is normal. Minimal chronic interstitial disease at the right lung base, slightly progressed. Hepatobiliary: Previous cholecystectomy. Two small calcified granulomas in the liver of no significance. Liver parenchyma is otherwise normal. No biliary ductal dilatation. Pancreas: Normal. Spleen: Normal. Adrenals/Urinary Tract: Normal adrenal glands. Bilateral renal atrophy. No mass lesions or hydronephrosis. Normal bladder. Stomach/Bowel: The stomach, small bowel, and colon appear normal. Appendix has been removed. Vascular/Lymphatic: No adenopathy. Extensive aortic atherosclerosis. Focal saccular dilatation of the distal abdominal aorta to a diameter of 2.8 cm, new since the study of 2013. Reproductive: Status post hysterectomy. No adnexal masses. Ovaries appear normal. Other: No abdominal wall hernia or ascites. Musculoskeletal: No acute abnormality. Lumbar fusion from L2 through L5. Posterior decompression at those levels.  IMPRESSION: No acute abnormality of the abdomen or pelvis. Aortic Atherosclerosis (ICD10-I70.0). Focal slight dilatation of the abdominal aorta to 2.8 cm. Ectatic abdominal aorta at risk for aneurysm development. Recommend followup by ultrasound in 5 years. This recommendation follows ACR consensus guidelines: White Paper of the ACR Incidental Findings Committee II on Vascular Findings. J Am Coll Radiol 2013; 10:789-794. Aortic aneurysm NOS (ICD10-I71.9) Electronically Signed   By: Lorriane Shire M.D.   On: 10/01/2018 15:34   Dg Chest Port 1 View  Result Date: 10/01/2018 CLINICAL DATA:  Chest pain, shortness of breath EXAM: PORTABLE CHEST 1 VIEW COMPARISON:  06/29/2018 FINDINGS: There is mild bilateral chronic interstitial thickening. There is no focal consolidation. There is no pleural effusion or pneumothorax. The heart and mediastinal contours are unremarkable. There is no acute osseous abnormality. IMPRESSION: No active disease. Electronically Signed   By: Kathreen Devoid   On: 10/01/2018 13:27    Procedures Procedures (including critical care time)  Medications Ordered in ED Medications  0.9 %  sodium chloride infusion (has no administration in time range)  sodium chloride 0.9 % bolus 250 mL (250 mLs Intravenous New Bag/Given 10/01/18 1602)    [name] was evaluated in Emergency Department on 10/01/2018 for the symptoms described in the history of present illness. She was evaluated in the context of the global COVID-19 pandemic, which necessitated consideration that the patient might be at risk for infection with the SARS-CoV-2 virus that causes COVID-19. Institutional protocols and algorithms that pertain to the evaluation of patients at risk for COVID-19 are in a state of rapid change based on information released by regulatory bodies including the CDC and federal and state organizations. These policies and algorithms were followed during the patient's care in the ED.  Initial Impression /  Assessment and Plan / ED Course  I have reviewed the triage vital signs and the nursing notes.  Pertinent labs & imaging results that were available during my care of the patient were reviewed by me and considered in my medical decision making (see chart for details).        Patient presenting with 1 week of worsening shortness of breath with exertion with associated chest heaviness.  She is also had some nausea without vomiting, generalized weakness.  Initially thought to be CHF exacerbation, her cardiologist Dr. Bronson Ing increased her lasix dose from 3 days from 40mg  QD to 40mg  BID, however this  provided no improvement.  No infectious symptoms or sick contacts.  On exam, no evidence of fluid overload, speaking in full sentences with normal O2 sat on room air.  Lungs are clear.  Abdomen is soft and nontender.  Labs, cardiac enzymes, BNP, chest x-ray.  Troponin and BNP are within normal limits.  Chest x-ray is negative for infiltrate or evidence of CHF exacerbation.  BMP revealing a new kidney injury, creatinine is up from 1.3 three months ago to 2.6 today.  There is also a new transaminitis of unknown etiology.  CT scan of the abdomen pelvis was done without any acute findings.  She is status post cholecystectomy.  At this time, will admit for acute kidney injury. Dr. Arlyce Dice accepting admission.  Patient was discussed with and evaluated by Dr. Rogene Houston.   Final Clinical Impressions(s) / ED Diagnoses   Final diagnoses:  Shortness of breath  AKI (acute kidney injury) [facility])  Transaminitis    ED Discharge Orders    None       Ceana Fiala, [place] N, PA-C 10/01/18 1657    Fredia Sorrow, MD 10/02/18 1658

## 2018-10-01 NOTE — ED Notes (Signed)
Admitting Doctor at bedside 

## 2018-10-01 NOTE — ED Notes (Signed)
Pt back from CT

## 2018-10-02 ENCOUNTER — Observation Stay (HOSPITAL_COMMUNITY): Payer: Medicare Other

## 2018-10-02 DIAGNOSIS — I739 Peripheral vascular disease, unspecified: Secondary | ICD-10-CM | POA: Diagnosis not present

## 2018-10-02 DIAGNOSIS — N390 Urinary tract infection, site not specified: Secondary | ICD-10-CM | POA: Diagnosis present

## 2018-10-02 DIAGNOSIS — I351 Nonrheumatic aortic (valve) insufficiency: Secondary | ICD-10-CM | POA: Diagnosis not present

## 2018-10-02 DIAGNOSIS — T501X5A Adverse effect of loop [high-ceiling] diuretics, initial encounter: Secondary | ICD-10-CM | POA: Diagnosis present

## 2018-10-02 DIAGNOSIS — R748 Abnormal levels of other serum enzymes: Secondary | ICD-10-CM | POA: Diagnosis present

## 2018-10-02 DIAGNOSIS — I5032 Chronic diastolic (congestive) heart failure: Secondary | ICD-10-CM | POA: Diagnosis present

## 2018-10-02 DIAGNOSIS — R0789 Other chest pain: Secondary | ICD-10-CM

## 2018-10-02 DIAGNOSIS — Z7901 Long term (current) use of anticoagulants: Secondary | ICD-10-CM | POA: Diagnosis not present

## 2018-10-02 DIAGNOSIS — N179 Acute kidney failure, unspecified: Secondary | ICD-10-CM | POA: Diagnosis not present

## 2018-10-02 DIAGNOSIS — E860 Dehydration: Secondary | ICD-10-CM | POA: Diagnosis present

## 2018-10-02 DIAGNOSIS — R197 Diarrhea, unspecified: Secondary | ICD-10-CM | POA: Diagnosis present

## 2018-10-02 DIAGNOSIS — I959 Hypotension, unspecified: Secondary | ICD-10-CM | POA: Diagnosis present

## 2018-10-02 DIAGNOSIS — Z87891 Personal history of nicotine dependence: Secondary | ICD-10-CM | POA: Diagnosis not present

## 2018-10-02 DIAGNOSIS — R079 Chest pain, unspecified: Secondary | ICD-10-CM | POA: Diagnosis not present

## 2018-10-02 DIAGNOSIS — I252 Old myocardial infarction: Secondary | ICD-10-CM | POA: Diagnosis not present

## 2018-10-02 DIAGNOSIS — Z20828 Contact with and (suspected) exposure to other viral communicable diseases: Secondary | ICD-10-CM | POA: Diagnosis present

## 2018-10-02 DIAGNOSIS — I480 Paroxysmal atrial fibrillation: Secondary | ICD-10-CM | POA: Diagnosis present

## 2018-10-02 DIAGNOSIS — I2510 Atherosclerotic heart disease of native coronary artery without angina pectoris: Secondary | ICD-10-CM | POA: Diagnosis present

## 2018-10-02 DIAGNOSIS — B9620 Unspecified Escherichia coli [E. coli] as the cause of diseases classified elsewhere: Secondary | ICD-10-CM | POA: Diagnosis present

## 2018-10-02 DIAGNOSIS — M109 Gout, unspecified: Secondary | ICD-10-CM | POA: Diagnosis present

## 2018-10-02 DIAGNOSIS — T464X5A Adverse effect of angiotensin-converting-enzyme inhibitors, initial encounter: Secondary | ICD-10-CM | POA: Diagnosis present

## 2018-10-02 DIAGNOSIS — I130 Hypertensive heart and chronic kidney disease with heart failure and stage 1 through stage 4 chronic kidney disease, or unspecified chronic kidney disease: Secondary | ICD-10-CM | POA: Diagnosis present

## 2018-10-02 DIAGNOSIS — J449 Chronic obstructive pulmonary disease, unspecified: Secondary | ICD-10-CM | POA: Diagnosis present

## 2018-10-02 DIAGNOSIS — R0602 Shortness of breath: Secondary | ICD-10-CM | POA: Diagnosis not present

## 2018-10-02 DIAGNOSIS — I77811 Abdominal aortic ectasia: Secondary | ICD-10-CM | POA: Diagnosis present

## 2018-10-02 DIAGNOSIS — E039 Hypothyroidism, unspecified: Secondary | ICD-10-CM | POA: Diagnosis present

## 2018-10-02 DIAGNOSIS — R740 Nonspecific elevation of levels of transaminase and lactic acid dehydrogenase [LDH]: Secondary | ICD-10-CM | POA: Diagnosis present

## 2018-10-02 DIAGNOSIS — N183 Chronic kidney disease, stage 3 (moderate): Secondary | ICD-10-CM | POA: Diagnosis present

## 2018-10-02 LAB — COMPREHENSIVE METABOLIC PANEL
ALT: 42 U/L (ref 0–44)
AST: 40 U/L (ref 15–41)
Albumin: 3.3 g/dL — ABNORMAL LOW (ref 3.5–5.0)
Alkaline Phosphatase: 186 U/L — ABNORMAL HIGH (ref 38–126)
Anion gap: 8 (ref 5–15)
BUN: 38 mg/dL — ABNORMAL HIGH (ref 8–23)
CO2: 21 mmol/L — ABNORMAL LOW (ref 22–32)
Calcium: 8.6 mg/dL — ABNORMAL LOW (ref 8.9–10.3)
Chloride: 107 mmol/L (ref 98–111)
Creatinine, Ser: 2.14 mg/dL — ABNORMAL HIGH (ref 0.44–1.00)
GFR calc Af Amer: 24 mL/min — ABNORMAL LOW (ref >60)
GFR calc non Af Amer: 21 mL/min — ABNORMAL LOW (ref >60)
Glucose, Bld: 85 mg/dL (ref 70–99)
Potassium: 3.9 mmol/L (ref 3.5–5.1)
Sodium: 136 mmol/L (ref 135–145)
Total Bilirubin: 0.6 mg/dL (ref 0.3–1.2)
Total Protein: 6.2 g/dL — ABNORMAL LOW (ref 6.5–8.1)

## 2018-10-02 LAB — GASTROINTESTINAL PANEL BY PCR, STOOL (REPLACES STOOL CULTURE)

## 2018-10-02 LAB — GLUCOSE, CAPILLARY: Glucose-Capillary: 107 mg/dL — ABNORMAL HIGH (ref 70–99)

## 2018-10-02 LAB — TROPONIN I: Troponin I: <0.03 ng/mL (ref <0.03)

## 2018-10-02 LAB — ECHOCARDIOGRAM COMPLETE
Height: 61 in
Weight: 2532.64 oz

## 2018-10-02 MED ORDER — LEVOTHYROXINE SODIUM 25 MCG PO TABS
25.0000 ug | ORAL_TABLET | Freq: Every day | ORAL | Status: DC
  Administered 2018-10-03 – 2018-10-04 (×2): 25 ug via ORAL
  Filled 2018-10-02 (×2): qty 1

## 2018-10-02 MED ORDER — LACTATED RINGERS IV SOLN
INTRAVENOUS | Status: DC
  Administered 2018-10-02 – 2018-10-03 (×2): via INTRAVENOUS

## 2018-10-02 MED ORDER — SODIUM CHLORIDE 0.9 % IV SOLN
1.0000 g | INTRAVENOUS | Status: DC
  Administered 2018-10-02 – 2018-10-03 (×2): 1 g via INTRAVENOUS
  Filled 2018-10-02 (×2): qty 10

## 2018-10-02 MED ORDER — METOPROLOL SUCCINATE ER 25 MG PO TB24
25.0000 mg | ORAL_TABLET | Freq: Every day | ORAL | Status: DC
  Administered 2018-10-02 – 2018-10-04 (×3): 25 mg via ORAL
  Filled 2018-10-02 (×3): qty 1

## 2018-10-02 MED ORDER — RISAQUAD PO CAPS
1.0000 | ORAL_CAPSULE | Freq: Every day | ORAL | Status: DC
  Administered 2018-10-02 – 2018-10-04 (×3): 1 via ORAL
  Filled 2018-10-02 (×3): qty 1

## 2018-10-02 NOTE — Consult Note (Signed)
Cardiology Consultation:   Patient ID: [name] MRN: [id number]; DOB: Jul 07, 1932  Admit date: 10/01/2018 Date of Consult: 10/02/2018  Primary Care Provider: Rosalee Kaufman, PA-C Primary Cardiologist: Kate Sable, MD  Primary Electrophysiologist:  Cristopher Peru, MD    Patient Profile:   CONSANDRA [name] is a 85 y.o. female with a hx of CAD who is being seen today for the evaluation of chest pain at the request of Dr [name].  History of Present Illness:   [name] is an 85 year old female with a history of coronary disease.  She had a CFX DES placed in 2015.  Catheterization in August 2017 revealed a patent stent with a residual 80% ostial small RCA which was treated medically.  Patient was evaluated this year on May 16, 2018 with catheterization.  This revealed a 90% small RCA, 50% CFX narrowing after a 20% narrowing of her DES and a 30% LAD.  Plan is for continued medical therapy.  Other medical issues include PAF, chronic anticoagulation with Eliquis, chronic renal insufficiency stage III, and GERD with history of stricture.  The patient had an echocardiogram in April 2016 that showed an ejection fraction of 55 to 60%.  TEE done in June 2019 showed an ejection fraction of 40 to 45% though I believe this was in the setting of atrial fibrillation.  The patient was seen by Dr. Bronson Ing in the office on August 15, 2018.  She had been taken off amiodarone by her PCP.  She had multiple complaints including dyspnea on exertion which he felt was secondary to deconditioning.  In June the patient called with abdominal swelling and Dr. Bronson Ing suggested she increase her Lasix for a few doses.  The patient was admitted 10/01/2018 through [facility] ED with complaints of chest pain and diarrhea.  Her troponins have been negative x3.  She did have acute kidney injury with a bump in her creatinine to 2.6 secondary to dehydration.  Cardiology has been asked to consult.   Past Medical  History:  Diagnosis Date   Anxiety    Arthritis    Atrial fibrillation (HCC)    Bursitis    Left shoulder   Cataract    CHF (congestive heart failure) (HCC)    COPD (chronic obstructive pulmonary disease) (HCC)    Coronary atherosclerosis of native coronary artery    a. s/p DES to LCx in 04/2013 b. cath in 11/2015 showing patent stent with 20% prox-LAD and 80% ostial RCA stenosis for which medical management was recommended due to small artery size   Depression    Diastolic heart failure (HCC)    EF 55-60%   Dysphagia, unspecified(787.20)    Dyspnea    Dysrhythmia    Essential hypertension    GERD (gastroesophageal reflux disease)    Hx Schatzki's ring, multiple EGD/ED last 01/06/2004   Gout    Headache    History of anemia    Hyperlipidemia    Internal hemorrhoids without mention of complication    MI (myocardial infarction) ([place]) 2006   Microscopic colitis 2003   Panic disorder without agoraphobia    Paresthesia    Pneumonia 12/2011   PVD (peripheral vascular disease) (HCC)    S/P colonoscopy 09/27/2001   internal hemorrhoids, desc colon inflam polyp, SB BX-chronic duodenitis, colitis   Thyroid disease     Past Surgical History:  Procedure Laterality Date   ABDOMINAL HYSTERECTOMY     ABDOMINAL HYSTERECTOMY     ANTERIOR AND POSTERIOR REPAIR  with resection of vagina   ANTERIOR LAT LUMBAR FUSION N/A 08/01/2016   Procedure: Lumbar Two-Lumbar Five Transpsoas lateral interbody fusion with Lumbar Two-Three lateral plate fixation;  Surgeon: Kevan Ny Ditty, MD;  Location: [facility];  Service: Neurosurgery;  Laterality: N/A;  L2-5 Transpsoas lateral interbody fusion with L2-3 lateral plate fixation   APPENDECTOMY     BACK SURGERY     BIOPSY  07/05/2015   Procedure: BIOPSY;  Surgeon: Daneil Dolin, MD;  [facility];  Service: Endoscopy;;  gastric polyp biopsy, ascending colon biopsy   BLADDER SUSPENSION  11/09/2011   Procedure:  TRANSVAGINAL TAPE (TVT) PROCEDURE;  Surgeon: Marissa Nestle, MD;  Location: [facility];  Service: Urology;  Laterality: N/A;   bladder tack  06/2010   BREAST LUMPECTOMY  1998   left, benign   CARDIAC CATHETERIZATION     CARDIAC CATHETERIZATION     CARDIAC CATHETERIZATION N/A 12/16/2015   Procedure: Left Heart Cath and Coronary Angiography;  Surgeon: Troy Sine, MD;  Location: [facility];  Service: Cardiovascular;  Laterality: N/A;   CARDIOVERSION N/A 10/04/2017   Procedure: CARDIOVERSION;  Surgeon: Herminio Commons, MD;  Location: [facility];  Service: Cardiovascular;  Laterality: N/A;   CARDIOVERSION N/A 01/30/2018   Procedure: CARDIOVERSION;  Surgeon: Herminio Commons, MD;  [facility];  Service: Cardiovascular;  Laterality: N/A;   [place]   left   cataract surgery     CHOLECYSTECTOMY  1998   Cholecystectomy     COLONOSCOPY  03/16/2011   multiple hyperplastic colon polyps, sigmoid diverticulosis, melanosis coli   COLONOSCOPY WITH PROPOFOL N/A 07/05/2015   RMR:one 5 mm polyp in descending colon   CORONARY ANGIOGRAPHY N/A 05/16/2018   Procedure: CORONARY ANGIOGRAPHY (CATH LAB);  Surgeon: Belva Crome, MD;  Location: [facility];  Service: Cardiovascular;  Laterality: N/A;   CORONARY ANGIOPLASTY WITH STENT PLACEMENT     ESOPHAGEAL DILATION N/A 07/05/2015   Procedure: ESOPHAGEAL DILATION;  Surgeon: Daneil Dolin, MD;  [facility];  Service: Endoscopy;  Laterality: N/A;   ESOPHAGOGASTRODUODENOSCOPY (EGD) WITH PROPOFOL N/A 07/05/2015   [id number]   JOINT REPLACEMENT Right 2007   right knee   left hand surgery     LEFT HEART CATHETERIZATION WITH CORONARY ANGIOGRAM N/A 05/14/2013   Procedure: LEFT HEART CATHETERIZATION WITH CORONARY ANGIOGRAM;  Surgeon: Blane Ohara, MD;  Location: [facility] CATH LAB;  Service: Cardiovascular;  Laterality: N/A;   left rotator cuff surgery     LUMBAR LAMINECTOMY/DECOMPRESSION  MICRODISCECTOMY N/A 10/11/2012   Procedure: LUMBAR LAMINECTOMY/DECOMPRESSION MICRODISCECTOMY 2 LEVELS;  Surgeon: Floyce Stakes, MD;  Location: [place] [facility];  Service: Neurosurgery;  Laterality: N/A;  L3-4 L4-5 Laminectomy   LUMBAR WOUND DEBRIDEMENT N/A 09/27/2015   Procedure: Exploration of Lumbar Wound w/ Repair CSF Leak/Lumbar Drain Placement;  Surgeon: Leeroy Cha, MD;  Location: [place] [facility];  Service: Neurosurgery;  Laterality: N/A;   MALONEY DILATION  03/16/2011   Gastritis. No H.pylori on bx. 34F maloney dilation with disruption of  occult cevical esophageal web   NASAL SINUS SURGERY     right knee replacement  2007   right leg benign tumor     SHOULDER SURGERY Left    TEE WITHOUT CARDIOVERSION N/A 10/04/2017   Procedure: TRANSESOPHAGEAL ECHOCARDIOGRAM (TEE) WITH PROPOFOL;  Surgeon: Herminio Commons, MD;  Location[facility];  Service: Cardiovascular;  Laterality: N/A;   TONSILLECTOMY     unspecified area,  hysterectomy  1972   partial     Home Medications:  Prior to Admission medications   Medication Sig Start Date End Date Taking? Authorizing Provider  ALPRAZolam (XANAX) 0.25 MG tablet Take 0.25 mg by mouth 2 (two) times daily as needed for anxiety.  09/04/16  Yes [provider]  apixaban (ELIQUIS) 5 MG TABS tablet Take 1 tablet (5 mg total) by mouth 2 (two) times daily. Resume with AM dose on 05/17/2018 05/17/18  Yes Belva Crome, MD  colchicine 0.6 MG tablet Take 1 tablet (0.6 mg total) by mouth 2 (two) times daily. 06/29/18  Yes Milton Ferguson, MD  DULoxetine (CYMBALTA) 60 MG capsule Take 60 mg by mouth at bedtime.    Yes [provider]  furosemide (LASIX) 40 MG tablet TAKE ONE TABLET BY MOUTH DAILY. Patient taking differently: Take 40 mg by mouth daily.  04/02/18  Yes Herminio Commons, MD  isosorbide mononitrate (IMDUR) 60 MG 24 hr tablet Take 1 tablet (60 mg total) by mouth daily. 02/22/18  Yes Herminio Commons, MD  levothyroxine  (SYNTHROID, LEVOTHROID) 25 MCG tablet Take 1 tablet by mouth daily. 06/07/18  Yes [provider]  lisinopril (PRINIVIL,ZESTRIL) 2.5 MG tablet TAKE ONE TABLET BY MOUTH DAILY. Patient taking differently: 2.5 mg.  06/19/18  Yes Herminio Commons, MD  magnesium oxide (MAG-OX) 400 MG tablet TAKE ONE TABLET BY MOUTH DAILY. Patient taking differently: Take 400 mg by mouth daily.  05/03/18  Yes Herminio Commons, MD  metoprolol succinate (TOPROL-XL) 50 MG 24 hr tablet Take 1 tablet (50 mg total) by mouth daily. Take with or immediately following a meal. 01/30/18 05/15/19 Yes Herminio Commons, MD  nitroGLYCERIN (NITROSTAT) 0.4 MG SL tablet Place 1 tablet (0.4 mg total) under the tongue every 5 (five) minutes as needed for chest pain. Reported on 08/04/2015 11/20/17  Yes Herminio Commons, MD  pantoprazole (PROTONIX) 40 MG tablet Take 40 mg by mouth daily.    Yes [provider]  potassium chloride (K-DUR,KLOR-CON) 10 MEQ tablet TAKE TWO (2) TABLETS BY MOUTH DAILY. Patient taking differently: Take 20 mEq by mouth daily.  05/03/18  Yes Herminio Commons, MD  acetaminophen (TYLENOL) 500 MG tablet Take 500 mg by mouth every 6 (six) hours as needed for headache.     [provider]  fluticasone (FLONASE) 50 MCG/ACT nasal spray Place 2 sprays into both nostrils 2 (two) times daily as needed for allergies.     [provider]  HYDROcodone-acetaminophen (NORCO/VICODIN) 5-325 MG tablet Take 1 tablet by mouth every 6 (six) hours as needed. 06/29/18   Milton Ferguson, MD  ipratropium-albuterol (DUONEB) 0.5-2.5 (3) MG/3ML SOLN Take 3 mLs by nebulization every 6 (six) hours. 12/18/15   Regalado, Belkys A, MD  methocarbamol (ROBAXIN) 500 MG tablet Take 500 mg by mouth every 6 (six) hours as needed for muscle spasms.     [provider]  Multiple Vitamin (MULTIVITAMIN WITH MINERALS) TABS tablet Take 1 tablet by mouth daily. Centrum     [provider]  sodium  chloride (OCEAN) 0.65 % SOLN nasal spray Place 1 spray into both nostrils 2 (two) times daily.    [provider]    Inpatient Medications: Scheduled Meds:  apixaban  2.5 mg Oral BID   [START ON 10/03/2018] levothyroxine  25 mcg Oral Q0600   metoprolol succinate  25 mg Oral Daily   Continuous Infusions:  aztreonam 1 g (10/02/18 0406)   lactated ringers 75 mL/hr  at 10/02/18 1025   PRN Meds: acetaminophen **OR** acetaminophen, ALPRAZolam, ipratropium-albuterol, nitroGLYCERIN, ondansetron **OR** ondansetron (ZOFRAN) IV, polyethylene glycol  Allergies:    Allergies  Allergen Reactions   Cephalosporins Diarrhea and Nausea Only    Lightheaded   Levaquin [Levofloxacin In D5w] Swelling   Macrodantin [Nitrofurantoin Macrocrystal] Swelling   Phenothiazines Anaphylaxis and Hives   Polysorbate Anaphylaxis   Prednisone Shortness Of Breath   Buspirone Itching   Cardura [Doxazosin Mesylate] Itching   Codeine Itching   Acyclovir And Related Itching    Redness of skin   Prochlorperazine Other (See Comments)    "Upset stomach"   Ranexa [Ranolazine]     Severe drop in BP   Atorvastatin Hives    Cramping; tolerates Crestor ok   Ofloxacin Rash   Other Itching and Rash    "WOOL"= make skin look like it has been burned   Penicillins Other (See Comments)    Causes redness all over. Has patient had a PCN reaction causing immediate rash, facial/tongue/throat swelling, SOB or lightheadedness with hypotension: No Has patient had a PCN reaction causing severe rash involving mucus membranes or skin necrosis: No Has patient had a PCN reaction that required hospitalization No Has patient had a PCN reaction occurring within the last 10 years: No If all of the above answers are "NO", then may proceed with Cephalosporin use.    Pimozide Hives and Itching    Social History:   Social History   Socioeconomic History   Marital status: Divorced    Spouse name: Not on  file   Number of children: 5   Years of education: Not on file   Highest education level: Not on file  Occupational History   Occupation: retired  Scientist, product/process development strain: Not on file   Food insecurity    Worry: Not on file    Inability: Not on Lexicographer needs    Medical: Not on file    Non-medical: Not on file  Tobacco Use   Smoking status: Former Smoker    Packs/day: 1.00    Years: 64.00    Pack years: 64.00    Types: Cigarettes    Start date: 12/24/1947    Quit date: 11/17/2001    Years since quitting: 16.8   Smokeless tobacco: Never Used   Tobacco comment: Quit smoking in 2003  Substance and Sexual Activity   Alcohol use: No    Alcohol/week: 0.0 standard drinks   Drug use: No   Sexual activity: Never  Lifestyle   Physical activity    Days per week: Not on file    Minutes per session: Not on file   Stress: Not on file  Relationships   Social connections    Talks on phone: Not on file    Gets together: Not on file    Attends religious service: Not on file    Active member of club or organization: Not on file    Attends meetings of clubs or organizations: Not on file    Relationship status: Not on file   Intimate partner violence    Fear of current or ex partner: Not on file    Emotionally abused: Not on file    Physically abused: Not on file    Forced sexual activity: Not on file  Other Topics Concern   Not on file  Social History Narrative   Divorced.   Sister had colon perforation & died from complications in [place], [place]  Family History:    Family History  Problem Relation Age of Onset   Stroke Mother    Parkinson's disease Father    Coronary artery disease Other        family Hx-sons   Cancer Other    Stroke Other        family Hx   Hypertension Other        family Hx   Diabetes Brother    Heart disease Son        before age 80   Diabetes Son    Stroke Daughter 57   Colon cancer Neg Hx        ROS:  Please see the history of present illness.  All other ROS reviewed and negative.     Physical Exam/Data:   Vitals:   10/01/18 1739 10/01/18 2137 10/01/18 2156 10/02/18 0522  BP: (!) 100/51 120/63  (!) 120/52  Pulse: 60 66 62 64  Resp: 18 16  17   Temp: 97.7 F (36.5 C)  98.1 F (36.7 C) 98.2 F (36.8 C)  TempSrc: Oral  Oral   SpO2: 96% (!) 87% 100% 99%  Weight: 71.8 kg     Height: 5\' 1"  (1.549 m)       Intake/Output Summary (Last 24 hours) at 10/02/2018 1133 Last data filed at 10/02/2018 1000 Gross per 24 hour  Intake 1693.62 ml  Output 250 ml  Net 1443.62 ml   Last 3 Weights 10/01/2018 10/01/2018 08/15/2018  Weight (lbs) 158 lb 4.6 oz 155 lb 8 oz 162 lb  Weight (kg) 71.8 kg 70.534 kg 73.483 kg     Body mass index is 29.91 kg/m.  General:  Well nourished, well developed, in no acute distress HEENT: normal Lymph: no adenopathy Neck: no JVD Endocrine:  No thryomegaly Cardiac:  normal S1, S2; RRR Lungs:  clear to auscultation bilaterally, no wheezing, rhonchi or rales  Abd: soft, nontender, no hepatomegaly  Ext: no edema Musculoskeletal:  No deformities, BUE and BLE strength normal and equal Skin: warm and dry  Neuro:  CNs 2-12 intact, no focal abnormalities noted Psych:  Normal affect   EKG:  The EKG was personally reviewed and demonstrates:  NSR, HR 66, poor anterior RW  Relevant CV Studies: Cath 05/16/2018- Coronary Diagrams  Diagnostic Dominance: Left  Intervention    Laboratory Data:  Chemistry Recent Labs  Lab 10/01/18 1227 10/02/18 0538  NA 133* 136  K 4.2 3.9  CL 101 107  CO2 20* 21*  GLUCOSE 97 85  BUN 39* 38*  CREATININE 2.60* 2.14*  CALCIUM 9.2 8.6*  GFRNONAA 16* 21*  GFRAA 19* 24*  ANIONGAP 12 8    Recent Labs  Lab 10/01/18 1227 10/02/18 0538  PROT 7.5 6.2*  ALBUMIN 4.2 3.3*  AST 72* 40  ALT 62* 42  ALKPHOS 241* 186*  BILITOT 0.9 0.6   Hematology Recent Labs  Lab 10/01/18 1227  WBC 7.7  RBC 4.80  HGB 14.7   HCT 44.2  MCV 92.1  MCH 30.6  MCHC 33.3  RDW 14.4  PLT 265   Cardiac Enzymes Recent Labs  Lab 10/01/18 1227 10/01/18 1843 10/02/18 0018  TROPONINI <0.03 <0.03 <0.03   No results for input(s): TROPIPOC in the last 168 hours.  BNP Recent Labs  Lab 10/01/18 1227  BNP 54.0    DDimer No results for input(s): DDIMER in the last 168 hours.  Radiology/Studies:  Ct Abdomen Pelvis Wo Contrast  Result Date: 10/01/2018 CLINICAL DATA:  Abnormal liver  function tests. Abdominal discomfort nausea and diarrhea. Chest pain. EXAM: CT ABDOMEN AND PELVIS WITHOUT CONTRAST TECHNIQUE: Multidetector CT imaging of the abdomen and pelvis was performed following the standard protocol without IV contrast. COMPARISON:  CT scan dated 08/31/2011 FINDINGS: Lower chest: Extensive coronary artery calcification. Aortic atherosclerosis. Heart size is normal. Minimal chronic interstitial disease at the right lung base, slightly progressed. Hepatobiliary: Previous cholecystectomy. Two small calcified granulomas in the liver of no significance. Liver parenchyma is otherwise normal. No biliary ductal dilatation. Pancreas: Normal. Spleen: Normal. Adrenals/Urinary Tract: Normal adrenal glands. Bilateral renal atrophy. No mass lesions or hydronephrosis. Normal bladder. Stomach/Bowel: The stomach, small bowel, and colon appear normal. Appendix has been removed. Vascular/Lymphatic: No adenopathy. Extensive aortic atherosclerosis. Focal saccular dilatation of the distal abdominal aorta to a diameter of 2.8 cm, new since the study of 2013. Reproductive: Status post hysterectomy. No adnexal masses. Ovaries appear normal. Other: No abdominal wall hernia or ascites. Musculoskeletal: No acute abnormality. Lumbar fusion from L2 through L5. Posterior decompression at those levels. IMPRESSION: No acute abnormality of the abdomen or pelvis. Aortic Atherosclerosis (ICD10-I70.0). Focal slight dilatation of the abdominal aorta to 2.8 cm. Ectatic  abdominal aorta at risk for aneurysm development. Recommend followup by ultrasound in 5 years. This recommendation follows ACR consensus guidelines: White Paper of the ACR Incidental Findings Committee II on Vascular Findings. J Am Coll Radiol 2013; 10:789-794. Aortic aneurysm NOS (ICD10-I71.9) Electronically Signed   By: Lorriane Shire M.D.   On: 10/01/2018 15:34   Dg Chest Port 1 View  Result Date: 10/01/2018 CLINICAL DATA:  Chest pain, shortness of breath EXAM: PORTABLE CHEST 1 VIEW COMPARISON:  06/29/2018 FINDINGS: There is mild bilateral chronic interstitial thickening. There is no focal consolidation. There is no pleural effusion or pneumothorax. The heart and mediastinal contours are unremarkable. There is no acute osseous abnormality. IMPRESSION: No active disease. Electronically Signed   By: Kathreen Devoid   On: 10/01/2018 13:27    Assessment and Plan:   Chest pain- MI r/o by Troponin  CAD- CFX DES '15, known ostial small RCA treated medically.  Last cath 05/16/2018.  PAF- On Eliquis, off Amiodarone, holding NSR  AKI- Dehydration secondary to increased diuretics and diarrhea  HTN- Now hypotensive- home meds on hold  CRI-3-  baseline SCr 1.5-1.8  UTI- Per primary service  Plan: MD to see- echo ordered- agree with gentle hydration and holding ACE and diuretic.  Resume Imdur as soon as B/P allows.       For questions or updates, please contact [facility] Please consult [email or web address] for contact info under     Signed, Kerin Ransom, PA-C  10/02/2018 11:33 AM   Attending note  Patient seen and disucssed with PA Rosalyn Gess, I agree with his documentation above. 85 yo female history of CAD with prior DES to LCX in 2015, last cath Jan 2020 with stable disease. History of COPD, chronic diastolic HF, afib, CKD 3. Presents with chest pain and SOB.     WBC 7.7 Hgb 14.7 Plt 265 K 4.2 Cr 2.60 BNP 54 Mg 2.1 Trop neg-->neg--> COVID 19 neg 11/2015 cath: LM patent, LAD prox 20%,  D1 20%, LCX dominant with patent stent, RCA very small 80% Jan 2020 cath: LM patent, LAD 20% prox, D1 20%, LCX patent stent, mid LCX 50%, RCA small nondominant 90% CXR no acute process CT A/P: no acute process EKG: inferior and lateral precordial ST depressions  She describes a several year history of intermittent left sided chest pain, sharp/pressure.  Can occur at rest or with activity. Can feel nauseus, lightheaded. Lasts just a few seconds, then resolves. Recent symptoms have been similar to her chronic symptoms but more intense. Caths in 2017 and Jan 2020 stable nonobstructive disease. EKG this admit with new diffuse ST depressions and aVR elevation, trops negative thus far, echo is pending.Primary compliant on presetnation was abdominal pain and diarrhea that is still resolving, she also had AKI and dehydration.   Would consider ischemic testing once acute GI issues have resolved given EKG changes, likely noninvasive imaging unless trop becomes positive or echo changes, certainyly would not be a cath candidate now with current renal function.     Carlyle Dolly MD

## 2018-10-02 NOTE — Progress Notes (Signed)
[facility] Progress Note  Patient: [name] FBP:[id number]   PCP: Rosalee Kaufman, PA-C DOB: 1933-02-06   DOA: 10/01/2018   DOS: 10/02/2018   Date of Service: the patient was seen and examined on 10/02/2018  Brief hospital course:  [name] is a 85 y.o. female with medical history significant for coronary artery disease, diastolic CHF, paroxysmal atrial fibrillation, CKD 3, hypertension and COPD who presented to the ED with complaints of 1 week of intermittent chest pain left-sided-just beneath her left breast, radiating down her left arm with numbness with associated difficulty breathing and nausea.  Pain is described as pressure-like.  Chest pain and difficulty breathing are provoked by activity, relieved by rest.  Patient said over the past week any little activity provokes her symptoms. She reports multiple episodes day and night of nonbloody watery stools over the past 2 weeks.  She had 5 episodes of watery stools yesterday.  She reports nausea without vomiting.  She also endorses mild suprapubic abdominal pain with pain with urination. She reports some mild lower extremity swelling. She reports a weight of 165 2 weeks ago, and 150 yesterday.  She reports compliance with low-salt diet.  Also endorses compliance with her Eliquis twice a day.  Patient called her cardiologist 6/10, as she felt like she had extra weight around her abdomen, in addition to her chest pain symptoms.  Increase Lasix dose of 40 mg twice daily for 3 days was recommended.  And is now back to her regular dose of lasix. Currently further plan is Continue monitoring course for GI as well as UTI treatment and follow-up on cardiology recommendation.  Subjective: Still reports nausea as well as abdominal pain in the suprapubic area.  Also reports diarrhea.  No fever no chills since admission.  Reports chest pain which is located in lower rib cage area on the left side.  Assessment and Plan: Acute kidney  injury on CKD 3- creatinine 2.6, baseline 1.3-1.5.   BUN 39, likely prerenal 2/2 two weeks of diarrhea, diuretics and likely lisinopril contributing.   CT abdomen and pelvis negative for any hydronephrosis or obstruction. Blood pressure systolic down to 82U in the ED, hemoglobin 14.9 suggest hemoconcentration.  26ml N/s given in ED. Continue IV fluids.  Would switch to LR. -Hold home Lasix, lisinopril  Diarrhea  watery stools for 2 weeks.  Nausea. WBC 7.7.  Afebrile.  She was also recently started on colchicine. - Hydrate -Negative stool C. difficile, and GI pathogen panel -Hold colchicine for now  Exertional chest pain with CAD hx with stent placement-  Typical anginal symptoms.  Serial troponin <0.03.   Initial EKG with artifacts, but repeat shows slight Distal ST segment /T wave abnormality in diffuse leads- II, AVF, V3- V6.  Compliant with Eliquis-so doubt Pulmonary Embolism.   Follows with Dr. Max Sane.    Last cardiac cath 04/2018,  20-30% proximal LAD irregularity, nondominant right coronary with ostial 90% narrowing, 30 to 40% mid to distal circumflex irregularities. Findings unchanged from 2017 cath. -Continue home Eliquis, metoprolol -Not on statins,  Allergy listed -echocardiogram ordered -Cardiology consulted appreciate assistance  UTI- Suprapubic pain, Dysuria. UA consistent with many bacteria, >50 WBCs, mod leukocytes. Afebrile. -Follow-up urine cultures -Extensive allergy list including penicillin and cephalosporins and quinolones.  Will use IV aztreonam  Elevated liver enzymes-  AST 72, ALT 62, ALP 241.  Normal total bili 0.9.   Normal liver enzymes 3 months ago.   Abdominal CT negative for acute abnormality. Acute  hepatitis panel pending LFTs back to normal  Incidental ectatic abdominal aorta Ct abd+ pelvis Wo Contrast Ectatic abdominal aorta at risk for aneurysm development. Recommend followup by ultrasound in 5 years.  History of diastolic  CHF-compensated.   BNP 54.  Chest x-ray clear.  Last echo- TEE 09/2017-45%, diffuse hypokinesis. -Hold Lasix for now with AKI -Continue home metoprolol   Paroxysmal atrial fibrillation- currently in sinus rhythm.  Rate controlled and anticoagulated. -Continue home Eliquis -Continue Toprol-XL, 25 mg daily (home dose 50 mg daily)  Hypertension- hypotensive to soft blood pressure. -Hold home lisinopril, Imdur, Lasix and K-Dur supplements -Continue Toprol-XL, 25 mg daily (home dose 50 mg daily)  COPD, Gout -stable.  No wheezing. Not on home oxygen.  -Hold colchicine for now  Hypothyroidism-  -resume home meds.  Diet: Cardiac diet DVT Prophylaxis: Subcutaneous Heparin   Advance goals of care discussion: Full code  Family Communication: no family was present at bedside, at the time of interview.   Disposition:  Discharge to Home.  Consultants: Cardiology Procedures: Echocardiogram  Scheduled Meds:  apixaban  2.5 mg Oral BID   metoprolol succinate  50 mg Oral Daily   Continuous Infusions:  sodium chloride 100 mL/hr at 10/02/18 0404   aztreonam 1 g (10/02/18 0406)   PRN Meds: acetaminophen **OR** acetaminophen, ALPRAZolam, ipratropium-albuterol, nitroGLYCERIN, ondansetron **OR** ondansetron (ZOFRAN) IV, polyethylene glycol Antibiotics: Anti-infectives (From admission, onward)   Start     Dose/Rate Route Frequency Ordered Stop   10/01/18 2200  aztreonam (AZACTAM) injection 1 g  Status:  Discontinued    Note to Pharmacy: Please adjust dose as indicated. Thank you.   1 g Intramuscular Every 8 hours 10/01/18 1846 10/01/18 1855   10/01/18 2000  aztreonam (AZACTAM) 1 g in sodium chloride 0.9 % 100 mL IVPB    Note to Pharmacy: Please adjust dose as indicated. Thank you.   1 g 200 mL/hr over 30 Minutes Intravenous Every 8 hours 10/01/18 1855         Objective: Physical Exam: Vitals:   10/01/18 1739 10/01/18 2137 10/01/18 2156 10/02/18 0522  BP: (!) 100/51 120/63  (!)  120/52  Pulse: 60 66 62 64  Resp: 18 16  17   Temp: 97.7 F (36.5 C)  98.1 F (36.7 C) 98.2 F (36.8 C)  TempSrc: Oral  Oral   SpO2: 96% (!) 87% 100% 99%  Weight: 71.8 kg     Height: 5\' 1"  (1.549 m)       Intake/Output Summary (Last 24 hours) at 10/02/2018 0750 Last data filed at 10/02/2018 0600 Gross per 24 hour  Intake 1693.62 ml  Output --  Net 1693.62 ml   Filed Weights   10/01/18 1153 10/01/18 1739  Weight: 70.5 kg 71.8 kg   General: alert and oriented to time, place, and person. Appear in mild distress, affect appropriate Eyes: PERRLno, Conjunctiva normal ENT: Oral Mucosa Clear, moist  Neck: no JVD, no Abnormal Mass Or lumps Cardiovascular: S1 and S2 Present, no Murmur, peripheral pulses symmetrical Respiratory: normal respiratory effort, Bilateral Air entry equal and Decreased, no use of accessory muscle, Clear to Auscultation, no Crackles, no wheezes Abdomen: Bowel Sound present, Soft and mild suprapubic tenderness, no hernia Skin: no rashes  Extremities: no Pedal edema, no calf tenderness Neurologic: normal without focal findings, mental status, speech normal, alert and oriented x3, PERLA, Motor strength 5/5 and symmetric and sensation grossly normal to light touch Gait not checked due to patient safety concerns  Data Reviewed: CBC: Recent Labs  Lab 10/01/18 1227  WBC 7.7  NEUTROABS 4.5  HGB 14.7  HCT 44.2  MCV 92.1  PLT 542   Basic Metabolic Panel: Recent Labs  Lab 10/01/18 1227 10/01/18 1604 10/02/18 0538  NA 133*  --  136  K 4.2  --  3.9  CL 101  --  107  CO2 20*  --  21*  GLUCOSE 97  --  85  BUN 39*  --  38*  CREATININE 2.60*  --  2.14*  CALCIUM 9.2  --  8.6*  MG  --  2.1  --     Liver Function Tests: Recent Labs  Lab 10/01/18 1227 10/02/18 0538  AST 72* 40  ALT 62* 42  ALKPHOS 241* 186*  BILITOT 0.9 0.6  PROT 7.5 6.2*  ALBUMIN 4.2 3.3*   No results for input(s): LIPASE, AMYLASE in the last 168 hours. No results for input(s):  AMMONIA in the last 168 hours. Coagulation Profile: No results for input(s): INR, PROTIME in the last 168 hours. Cardiac Enzymes: Recent Labs  Lab 10/01/18 1227 10/01/18 1843 10/02/18 0018  TROPONINI <0.03 <0.03 <0.03   BNP (last 3 results) No results for input(s): PROBNP in the last 8760 hours. CBG: No results for input(s): GLUCAP in the last 168 hours. Studies: Ct Abdomen Pelvis Wo Contrast  Result Date: 10/01/2018 CLINICAL DATA:  Abnormal liver function tests. Abdominal discomfort nausea and diarrhea. Chest pain. EXAM: CT ABDOMEN AND PELVIS WITHOUT CONTRAST TECHNIQUE: Multidetector CT imaging of the abdomen and pelvis was performed following the standard protocol without IV contrast. COMPARISON:  CT scan dated 08/31/2011 FINDINGS: Lower chest: Extensive coronary artery calcification. Aortic atherosclerosis. Heart size is normal. Minimal chronic interstitial disease at the right lung base, slightly progressed. Hepatobiliary: Previous cholecystectomy. Two small calcified granulomas in the liver of no significance. Liver parenchyma is otherwise normal. No biliary ductal dilatation. Pancreas: Normal. Spleen: Normal. Adrenals/Urinary Tract: Normal adrenal glands. Bilateral renal atrophy. No mass lesions or hydronephrosis. Normal bladder. Stomach/Bowel: The stomach, small bowel, and colon appear normal. Appendix has been removed. Vascular/Lymphatic: No adenopathy. Extensive aortic atherosclerosis. Focal saccular dilatation of the distal abdominal aorta to a diameter of 2.8 cm, new since the study of 2013. Reproductive: Status post hysterectomy. No adnexal masses. Ovaries appear normal. Other: No abdominal wall hernia or ascites. Musculoskeletal: No acute abnormality. Lumbar fusion from L2 through L5. Posterior decompression at those levels. IMPRESSION: No acute abnormality of the abdomen or pelvis. Aortic Atherosclerosis (ICD10-I70.0). Focal slight dilatation of the abdominal aorta to 2.8 cm. Ectatic  abdominal aorta at risk for aneurysm development. Recommend followup by ultrasound in 5 years. This recommendation follows ACR consensus guidelines: White Paper of the ACR Incidental Findings Committee II on Vascular Findings. J Am Coll Radiol 2013; 10:789-794. Aortic aneurysm NOS (ICD10-I71.9) Electronically Signed   By: Lorriane Shire M.D.   On: 10/01/2018 15:34   Dg Chest Port 1 View  Result Date: 10/01/2018 CLINICAL DATA:  Chest pain, shortness of breath EXAM: PORTABLE CHEST 1 VIEW COMPARISON:  06/29/2018 FINDINGS: There is mild bilateral chronic interstitial thickening. There is no focal consolidation. There is no pleural effusion or pneumothorax. The heart and mediastinal contours are unremarkable. There is no acute osseous abnormality. IMPRESSION: No active disease. Electronically Signed   By: Kathreen Devoid   On: 10/01/2018 13:27     Time spent: 35 minutes  Author: Berle Mull, MD [facility] 10/02/2018 7:50 AM  To reach On-call, see care teams to locate the attending and reach out  to them via [email or web address]. If 7PM-7AM, please contact night-coverage If you still have difficulty reaching the attending provider, please page the [facility] (Director on Call) for [facility] on amion for assistance.

## 2018-10-02 NOTE — Progress Notes (Signed)
*  PRELIMINARY RESULTS* Echocardiogram 2D Echocardiogram has been performed.  Leavy Cella 10/02/2018, 9:56 AM

## 2018-10-03 DIAGNOSIS — R079 Chest pain, unspecified: Secondary | ICD-10-CM

## 2018-10-03 LAB — COMPREHENSIVE METABOLIC PANEL
ALT: 34 U/L (ref 0–44)
AST: 31 U/L (ref 15–41)
Albumin: 3.1 g/dL — ABNORMAL LOW (ref 3.5–5.0)
Alkaline Phosphatase: 171 U/L — ABNORMAL HIGH (ref 38–126)
Anion gap: 7 (ref 5–15)
BUN: 25 mg/dL — ABNORMAL HIGH (ref 8–23)
CO2: 21 mmol/L — ABNORMAL LOW (ref 22–32)
Calcium: 8.7 mg/dL — ABNORMAL LOW (ref 8.9–10.3)
Chloride: 110 mmol/L (ref 98–111)
Creatinine, Ser: 1.6 mg/dL — ABNORMAL HIGH (ref 0.44–1.00)
GFR calc Af Amer: 34 mL/min — ABNORMAL LOW (ref >60)
GFR calc non Af Amer: 29 mL/min — ABNORMAL LOW (ref >60)
Glucose, Bld: 78 mg/dL (ref 70–99)
Potassium: 4 mmol/L (ref 3.5–5.1)
Sodium: 138 mmol/L (ref 135–145)
Total Bilirubin: 0.6 mg/dL (ref 0.3–1.2)
Total Protein: 5.9 g/dL — ABNORMAL LOW (ref 6.5–8.1)

## 2018-10-03 LAB — CBC WITH DIFFERENTIAL/PLATELET
Abs Immature Granulocytes: 0.03 10*3/uL (ref 0.00–0.07)
Basophils Absolute: 0.1 10*3/uL (ref 0.0–0.1)
Basophils Relative: 1 %
Eosinophils Absolute: 0.1 10*3/uL (ref 0.0–0.5)
Eosinophils Relative: 2 %
HCT: 37.8 % (ref 36.0–46.0)
Hemoglobin: 12.2 g/dL (ref 12.0–15.0)
Immature Granulocytes: 1 %
Lymphocytes Relative: 37 %
Lymphs Abs: 1.8 10*3/uL (ref 0.7–4.0)
MCH: 30.7 pg (ref 26.0–34.0)
MCHC: 32.3 g/dL (ref 30.0–36.0)
MCV: 95 fL (ref 80.0–100.0)
Monocytes Absolute: 0.5 10*3/uL (ref 0.1–1.0)
Monocytes Relative: 10 %
Neutro Abs: 2.4 10*3/uL (ref 1.7–7.7)
Neutrophils Relative %: 49 %
Platelets: 173 10*3/uL (ref 150–400)
RBC: 3.98 MIL/uL (ref 3.87–5.11)
RDW: 14.3 % (ref 11.5–15.5)
WBC: 4.9 10*3/uL (ref 4.0–10.5)
nRBC: 0 % (ref 0.0–0.2)

## 2018-10-03 LAB — HEPATITIS PANEL, ACUTE
HCV Ab: <0.1 s/co ratio (ref 0.0–0.9)
Hep A IgM: NEGATIVE
Hep B C IgM: NEGATIVE
Hepatitis B Surface Ag: NEGATIVE

## 2018-10-03 LAB — MAGNESIUM: Magnesium: 1.9 mg/dL (ref 1.7–2.4)

## 2018-10-03 NOTE — Progress Notes (Signed)
[facility] Progress Note  Patient: [name] XYI:[id number]   PCP: Rosalee Kaufman, PA-C DOB: [birth year]   DOA: 10/01/2018   DOS: 10/03/2018   Date of Service: the patient was seen and examined on 10/03/2018  Brief hospital course:  [name] is a 85 y.o. female with medical history significant for coronary artery disease, diastolic CHF, paroxysmal atrial fibrillation, CKD 3, hypertension and COPD who presented to the ED with complaints of 1 week of intermittent chest pain left-sided-just beneath her left breast, radiating down her left arm with numbness with associated difficulty breathing and nausea.  Pain is described as pressure-like.  Chest pain and difficulty breathing are provoked by activity, relieved by rest.  Patient said over the past week any little activity provokes her symptoms. She reports multiple episodes day and night of nonbloody watery stools over the past 2 weeks.  She had 5 episodes of watery stools yesterday.  She reports nausea without vomiting.  She also endorses mild suprapubic abdominal pain with pain with urination. She reports some mild lower extremity swelling. She reports a weight of 165 2 weeks ago, and 150 yesterday.  She reports compliance with low-salt diet.  Also endorses compliance with her Eliquis twice a day.  Patient called her cardiologist 6/10, as she felt like she had extra weight around her abdomen, in addition to her chest pain symptoms.  Increase Lasix dose of 40 mg twice daily for 3 days was recommended.  And is now back to her regular dose of lasix. Currently further plan is Continue monitoring course for GI as well as UTI treatment and follow-up on cardiology recommendation.  Subjective: Pain better.  No nausea no vomiting.  No further diarrhea so far.  Oral intake is improving.  Assessment and Plan: Acute kidney injury on CKD 3 creatinine 2.6, baseline 1.3-1.5.   BUN 39, likely prerenal 2/2 two weeks of diarrhea, diuretics and  likely lisinopril contributing.   CT abdomen and pelvis negative for any hydronephrosis or obstruction. Blood pressure systolic down to 27M in the ED, hemoglobin 14.9 suggest hemoconcentration. Treated with IV fluids  Stop IV fluid for now.  Renal function back to baseline. -Hold home Lasix, lisinopril  Diarrhea  watery stools for 2 weeks.  Nausea. WBC 7.7.  Afebrile.  She was also recently started on colchicine. -Negative stool C. difficile, and GI pathogen panel -Hold colchicine for now  Exertional chest pain with CAD hx with stent placement-  Typical anginal symptoms.  Serial troponin <0.03.   Initial EKG with artifacts, but repeat shows slight Distal ST segment /T wave abnormality in diffuse leads- II, AVF, V3- V6.  Compliant with Eliquis-so doubt Pulmonary Embolism.   Follows with Dr. Max Sane.    Last cardiac cath 04/2018,  20-30% proximal LAD irregularity, nondominant right coronary with ostial 90% narrowing, 30 to 40% mid to distal circumflex irregularities. Findings unchanged from 2017 cath. -Continue home Eliquis, metoprolol -Not on statins,  Allergy listed -echocardiogram shows preserved EF no acute abnormality. -Cardiology consulted appreciate assistance no further inpatient work-up recommended.  E. coli UTI Suprapubic pain, Dysuria. UA consistent with many bacteria, >50 WBCs, mod leukocytes. Afebrile. -Follow-up urine cultures, now growing E. coli sensitivities pending. -Extensive allergy list including penicillin and cephalosporins and quinolones.  Initially was on IV aztreonam although currently tolerating IV Rocephin.  Will continue the same antibiotic.  Elevated liver enzymes-  AST 72, ALT 62, ALP 241.  Normal total bili 0.9.   Normal liver enzymes 3 months ago.  Abdominal CT negative for acute abnormality. Acute hepatitis panel pending LFTs back to normal  Incidental ectatic abdominal aorta Ct abd+ pelvis Wo Contrast Ectatic abdominal aorta at risk for  aneurysm development. Recommend followup by ultrasound in 5 years.  History of diastolic CHF-compensated.   BNP 54.  Chest x-ray clear.  Last echo- TEE 09/2017-45%, diffuse hypokinesis. -Hold Lasix for now with AKI -Continue home metoprolol   Paroxysmal atrial fibrillation- currently in sinus rhythm.  Rate controlled and anticoagulated. -Continue home Eliquis -Continue Toprol-XL, 25 mg daily (home dose 50 mg daily)  Hypertension- hypotensive to soft blood pressure. -Hold home lisinopril, Imdur, Lasix and K-Dur supplements -Continue Toprol-XL, 25 mg daily (home dose 50 mg daily)  COPD, Gout -stable.  No wheezing. Not on home oxygen.  -Hold colchicine for now  Hypothyroidism-  -resume home meds.  Diet: Cardiac diet DVT Prophylaxis: Subcutaneous Heparin   Advance goals of care discussion: Full code  Family Communication: no family was present at bedside, at the time of interview.   Disposition:  Discharge to Home.  Consultants: Cardiology Procedures: Echocardiogram  Scheduled Meds: . acidophilus  1 capsule Oral Daily  . apixaban  2.5 mg Oral BID  . levothyroxine  25 mcg Oral Q0600  . metoprolol succinate  25 mg Oral Daily   Continuous Infusions: . cefTRIAXone (ROCEPHIN)  IV 1 g (10/03/18 1343)   PRN Meds: acetaminophen **OR** acetaminophen, ALPRAZolam, ipratropium-albuterol, nitroGLYCERIN, ondansetron **OR** ondansetron (ZOFRAN) IV, polyethylene glycol Antibiotics: Anti-infectives (From admission, onward)   Start     Dose/Rate Route Frequency Ordered Stop   10/02/18 1200  cefTRIAXone (ROCEPHIN) 1 g in sodium chloride 0.9 % 100 mL IVPB     1 g 200 mL/hr over 30 Minutes Intravenous Every 24 hours 10/02/18 1155     10/01/18 2200  aztreonam (AZACTAM) injection 1 g  Status:  Discontinued    Note to Pharmacy: Please adjust dose as indicated. Thank you.   1 g Intramuscular Every 8 hours 10/01/18 1846 10/01/18 1855   10/01/18 2000  aztreonam (AZACTAM) 1 g in sodium  chloride 0.9 % 100 mL IVPB  Status:  Discontinued    Note to Pharmacy: Please adjust dose as indicated. Thank you.   1 g 200 mL/hr over 30 Minutes Intravenous Every 8 hours 10/01/18 1855 10/02/18 1154       Objective: Physical Exam: Vitals:   10/01/18 2156 10/02/18 0522 10/02/18 2119 10/03/18 0507  BP:  (!) 120/52 (!) 144/67 (!) 107/42  Pulse: 62 64 62 64  Resp:  17 17 16   Temp: 98.1 F (36.7 C) 98.2 F (36.8 C) 98 F (36.7 C) 97.9 F (36.6 C)  TempSrc: Oral  Oral Oral  SpO2: 100% 99% 100% 100%  Weight:      Height:        Intake/Output Summary (Last 24 hours) at 10/03/2018 1526 Last data filed at 10/03/2018 0447 Gross per 24 hour  Intake 1474.01 ml  Output 250 ml  Net 1224.01 ml   Filed Weights   10/01/18 1153 10/01/18 1739  Weight: 70.5 kg 71.8 kg   General: alert and oriented to time, place, and person. Appear in mild distress, affect appropriate Eyes: PERRLno, Conjunctiva normal ENT: Oral Mucosa Clear, moist  Neck: no JVD, no Abnormal Mass Or lumps Cardiovascular: S1 and S2 Present, no Murmur, peripheral pulses symmetrical Respiratory: normal respiratory effort, Bilateral Air entry equal and Decreased, no use of accessory muscle, Clear to Auscultation, no Crackles, no wheezes Abdomen: Bowel Sound present, Soft and  mild suprapubic tenderness, no hernia Skin: no rashes  Extremities: no Pedal edema, no calf tenderness Neurologic: normal without focal findings, mental status, speech normal, alert and oriented x3, PERLA, Motor strength 5/5 and symmetric and sensation grossly normal to light touch Gait not checked due to patient safety concerns  Data Reviewed: CBC: Recent Labs  Lab 10/01/18 1227 10/03/18 0442  WBC 7.7 4.9  NEUTROABS 4.5 2.4  HGB 14.7 12.2  HCT 44.2 37.8  MCV 92.1 95.0  PLT 265 767   Basic Metabolic Panel: Recent Labs  Lab 10/01/18 1227 10/01/18 1604 10/02/18 0538 10/03/18 0442  NA 133*  --  136 138  K 4.2  --  3.9 4.0  CL 101  --  107  110  CO2 20*  --  21* 21*  GLUCOSE 97  --  85 78  BUN 39*  --  38* 25*  CREATININE 2.60*  --  2.14* 1.60*  CALCIUM 9.2  --  8.6* 8.7*  MG  --  2.1  --  1.9    Liver Function Tests: Recent Labs  Lab 10/01/18 1227 10/02/18 0538 10/03/18 0442  AST 72* 40 31  ALT 62* 42 34  ALKPHOS 241* 186* 171*  BILITOT 0.9 0.6 0.6  PROT 7.5 6.2* 5.9*  ALBUMIN 4.2 3.3* 3.1*   No results for input(s): LIPASE, AMYLASE in the last 168 hours. No results for input(s): AMMONIA in the last 168 hours. Coagulation Profile: No results for input(s): INR, PROTIME in the last 168 hours. Cardiac Enzymes: Recent Labs  Lab 10/01/18 1227 10/01/18 1843 10/02/18 0018  TROPONINI <0.03 <0.03 <0.03   BNP (last 3 results) No results for input(s): PROBNP in the last 8760 hours. CBG: Recent Labs  Lab 10/02/18 1611  GLUCAP 107*   Studies: No results found.   Time spent: 35 minutes  Author: Berle Mull, MD [facility] 10/03/2018 3:26 PM  To reach On-call, see care teams to locate the attending and reach out to them via [email or web address]. If 7PM-7AM, please contact night-coverage If you still have difficulty reaching the attending provider, please page the [facility] (Director on Call) for [facility] on amion for assistance.

## 2018-10-03 NOTE — Evaluation (Signed)
Physical Therapy Evaluation Patient Details Name: [name] MRN: [id number] DOB: 01/08/1933 Today's Date: 10/03/2018   History of Present Illness  [name] is a 85 y.o. female with medical history significant for coronary artery disease, diastolic CHF, paroxysmal atrial fibrillation, CKD 3, hypertension and COPD who presented to the ED with complaints of 1 week of intermittent chest pain left-sided-just beneath her left breast, radiating down her left arm with numbness with associated difficulty breathing and nausea.  Pain is described as pressure-like.  Chest pain and difficulty breathing are provoked by activity, relieved by rest.  Patient said over the past week any little activity provokes her symptoms.She reports multiple episodes day and night of nonbloody watery stools over the past 2 weeks.  She had 5 episodes of watery stools yesterday.  She reports nausea without vomiting.  She also endorses mild suprapubic abdominal pain with pain with urination.She reports some mild lower extremity swelling. She reports a weight of 165 2 weeks ago, and 150 yesterday.  She reports compliance with low-salt diet.  Also endorses compliance with her Eliquis twice a day.    Clinical Impression  Patient functioning at baseline for functional mobility and gait, other than having to take a sitting rest break due to chronic cramping in low back, legs before walking back to room with no loss of balance.  Patient encouraged to stay out of bed and ambulate with nursing staff as tolerated.  Plan:  Patient discharged from physical therapy to care of nursing for ambulation daily as tolerated for length of stay.     Follow Up Recommendations No PT follow up    Equipment Recommendations  None recommended by PT    Recommendations for Other Services       Precautions / Restrictions Precautions Precautions: None Restrictions Weight Bearing Restrictions: No      Mobility  Bed Mobility Overal bed  mobility: Modified Independent             General bed mobility comments: head of bed raised  Transfers Overall transfer level: Modified independent Equipment used: None             General transfer comment: slightl increased time  Ambulation/Gait Ambulation/Gait assistance: Modified independent (Device/Increase time) Gait Distance (Feet): 100 Feet Assistive device: None Gait Pattern/deviations: WFL(Within Functional Limits);Decreased step length - left;Decreased stride length;Decreased step length - right Gait velocity: near normal   General Gait Details: grossly WFL, no loss of balance, on room air with SpO2 between 97-99%  Stairs            Wheelchair Mobility    Modified Rankin (Stroke Patients Only)       Balance Overall balance assessment: No apparent balance deficits (not formally assessed)                                           Pertinent Vitals/Pain Pain Assessment: Faces Faces Pain Scale: Hurts even more Pain Location: BLE from hips to feet, low back Pain Descriptors / Indicators: Cramping Pain Intervention(s): Limited activity within patient's tolerance;Monitored during session    Home Living Family/patient expects to be discharged to:: Private residence Living Arrangements: Children Available Help at Discharge: Family;Available PRN/intermittently Type of Home: House Home Access: Ramped entrance     Home Layout: One level Home Equipment: [place] - 4 wheels;Wheelchair - manual;Hospital bed;Shower seat;Bedside commode      Prior  Function Level of Independence: Independent with assistive device(s)         Comments: household ambulator without AD, uses Rollator for longer distances     Hand Dominance   Dominant Hand: Right    Extremity/Trunk Assessment   Upper Extremity Assessment Upper Extremity Assessment: Overall WFL for tasks assessed    Lower Extremity Assessment Lower Extremity Assessment: Overall  WFL for tasks assessed    Cervical / Trunk Assessment Cervical / Trunk Assessment: Normal  Communication   Communication: No difficulties  Cognition Arousal/Alertness: Awake/alert Behavior During Therapy: WFL for tasks assessed/performed Overall Cognitive Status: Within Functional Limits for tasks assessed                                        General Comments      Exercises     Assessment/Plan    PT Assessment Patent does not need any further PT services  PT Problem List         PT Treatment Interventions      PT Goals (Current goals can be found in the Care Plan section)  Acute Rehab PT Goals Time For Goal Achievement: 10/03/18 Potential to Achieve Goals: Good    Frequency     Barriers to discharge        Co-evaluation               AM-PAC PT "6 Clicks" Mobility  Outcome Measure Help needed turning from your back to your side while in a flat bed without using bedrails?: None Help needed moving from lying on your back to sitting on the side of a flat bed without using bedrails?: None Help needed moving to and from a bed to a chair (including a wheelchair)?: None Help needed standing up from a chair using your arms (e.g., wheelchair or bedside chair)?: None Help needed to walk in hospital room?: None Help needed climbing 3-5 steps with a railing? : A Little 6 Click Score: 23    End of Session   Activity Tolerance: Patient tolerated treatment well;Patient limited by fatigue;Patient limited by pain Patient left: in chair;with call bell/phone within reach Nurse Communication: Mobility status PT Visit Diagnosis: Unsteadiness on feet (R26.81);Other abnormalities of gait and mobility (R26.89);Muscle weakness (generalized) (M62.81)    Time: 3818-2993 PT Time Calculation (min) (ACUTE ONLY): 23 min   Charges:   PT Evaluation $PT Eval Moderate Complexity: 1 Mod PT Treatments $Gait Training: 23-37 mins        2:24 PM, 10/03/18 Lonell Grandchild, MPT Physical Therapist with [facility] [phone] office [phone] mobile phone

## 2018-10-03 NOTE — Progress Notes (Signed)
Subjective:  Denies SSCP, palpitations or Dyspnea Eating breakfast   Objective:  Vitals:   10/01/18 2156 10/02/18 0522 10/02/18 2119 10/03/18 0507  BP:  (!) 120/52 (!) 144/67 (!) 107/42  Pulse: 62 64 62 64  Resp:  17 17 16   Temp: 98.1 F (36.7 C) 98.2 F (36.8 C) 98 F (36.7 C) 97.9 F (36.6 C)  TempSrc: Oral  Oral Oral  SpO2: 100% 99% 100% 100%  Weight:      Height:        Intake/Output from previous day:  Intake/Output Summary (Last 24 hours) at 10/03/2018 0816 Last data filed at 10/03/2018 0447 Gross per 24 hour  Intake 2110.97 ml  Output 1250 ml  Net 860.97 ml    Physical Exam: Affect appropriate Healthy:  appears stated age HEENT: normal Neck supple with no adenopathy JVP normal no bruits no thyromegaly Lungs clear with no wheezing and good diaphragmatic motion Heart:  S1/S2 no murmur, no rub, gallop or click PMI normal Abdomen: benighn, BS positve, no tenderness, no AAA no bruit.  No HSM or HJR Distal pulses intact with no bruits No edema Neuro non-focal Skin warm and dry No muscular weakness  Lab Results: Basic Metabolic Panel: Recent Labs    10/01/18 1604 10/02/18 0538 10/03/18 0442  NA  --  136 138  K  --  3.9 4.0  CL  --  107 110  CO2  --  21* 21*  GLUCOSE  --  85 78  BUN  --  38* 25*  CREATININE  --  2.14* 1.60*  CALCIUM  --  8.6* 8.7*  MG 2.1  --  1.9   Liver Function Tests: Recent Labs    10/02/18 0538 10/03/18 0442  AST 40 31  ALT 42 34  ALKPHOS 186* 171*  BILITOT 0.6 0.6  PROT 6.2* 5.9*  ALBUMIN 3.3* 3.1*   No results for input(s): LIPASE, AMYLASE in the last 72 hours. CBC: Recent Labs    10/01/18 1227 10/03/18 0442  WBC 7.7 4.9  NEUTROABS 4.5 2.4  HGB 14.7 12.2  HCT 44.2 37.8  MCV 92.1 95.0  PLT 265 173   Cardiac Enzymes: Recent Labs    10/01/18 1227 10/01/18 1843 10/02/18 0018  TROPONINI <0.03 <0.03 <0.03    Imaging: Ct Abdomen Pelvis Wo Contrast  Result Date: 10/01/2018 CLINICAL DATA:  Abnormal  liver function tests. Abdominal discomfort nausea and diarrhea. Chest pain. EXAM: CT ABDOMEN AND PELVIS WITHOUT CONTRAST TECHNIQUE: Multidetector CT imaging of the abdomen and pelvis was performed following the standard protocol without IV contrast. COMPARISON:  CT scan dated 08/31/2011 FINDINGS: Lower chest: Extensive coronary artery calcification. Aortic atherosclerosis. Heart size is normal. Minimal chronic interstitial disease at the right lung base, slightly progressed. Hepatobiliary: Previous cholecystectomy. Two small calcified granulomas in the liver of no significance. Liver parenchyma is otherwise normal. No biliary ductal dilatation. Pancreas: Normal. Spleen: Normal. Adrenals/Urinary Tract: Normal adrenal glands. Bilateral renal atrophy. No mass lesions or hydronephrosis. Normal bladder. Stomach/Bowel: The stomach, small bowel, and colon appear normal. Appendix has been removed. Vascular/Lymphatic: No adenopathy. Extensive aortic atherosclerosis. Focal saccular dilatation of the distal abdominal aorta to a diameter of 2.8 cm, new since the study of 2013. Reproductive: Status post hysterectomy. No adnexal masses. Ovaries appear normal. Other: No abdominal wall hernia or ascites. Musculoskeletal: No acute abnormality. Lumbar fusion from L2 through L5. Posterior decompression at those levels. IMPRESSION: No acute abnormality of the abdomen or pelvis. Aortic Atherosclerosis (ICD10-I70.0). Focal slight dilatation  of the abdominal aorta to 2.8 cm. Ectatic abdominal aorta at risk for aneurysm development. Recommend followup by ultrasound in 5 years. This recommendation follows ACR consensus guidelines: White Paper of the ACR Incidental Findings Committee II on Vascular Findings. J Am Coll Radiol 2013; 10:789-794. Aortic aneurysm NOS (ICD10-I71.9) Electronically Signed   By: Lorriane Shire M.D.   On: 10/01/2018 15:34   Dg Chest Port 1 View  Result Date: 10/01/2018 CLINICAL DATA:  Chest pain, shortness of  breath EXAM: PORTABLE CHEST 1 VIEW COMPARISON:  06/29/2018 FINDINGS: There is mild bilateral chronic interstitial thickening. There is no focal consolidation. There is no pleural effusion or pneumothorax. The heart and mediastinal contours are unremarkable. There is no acute osseous abnormality. IMPRESSION: No active disease. Electronically Signed   By: Kathreen Devoid   On: 10/01/2018 13:27    Cardiac Studies:  ECG: SR low voltage inferior lateral T wave changes    Telemetry: NSR 10/03/2018   Echo: EF 60-65% 6/17  Medications:   . acidophilus  1 capsule Oral Daily  . apixaban  2.5 mg Oral BID  . levothyroxine  25 mcg Oral Q0600  . metoprolol succinate  25 mg Oral Daily     . cefTRIAXone (ROCEPHIN)  IV 1 g (10/02/18 1229)  . lactated ringers 75 mL/hr at 10/03/18 0175    Assessment/Plan:   Chest Pain:  R/o ECG non specific ST changes History of circumflex stent 2015 However cath just in January 30 th 2020 no obstructive disease Continue medical Rx no indication for further ischemic w/u   PAF:  NSR this am on eliquis no changes   UTI  On ceftriaxone change to PO based on sensitivities   CRF:  Cr improved with hydration 2.6-> 1.6 near baseline taking PO well  Jenkins Rouge 10/03/2018, 8:16 AM

## 2018-10-04 ENCOUNTER — Inpatient Hospital Stay (HOSPITAL_COMMUNITY): Payer: Medicare Other

## 2018-10-04 LAB — BASIC METABOLIC PANEL
Anion gap: 7 (ref 5–15)
BUN: 16 mg/dL (ref 8–23)
CO2: 22 mmol/L (ref 22–32)
Calcium: 8.7 mg/dL — ABNORMAL LOW (ref 8.9–10.3)
Chloride: 109 mmol/L (ref 98–111)
Creatinine, Ser: 1.17 mg/dL — ABNORMAL HIGH (ref 0.44–1.00)
GFR calc Af Amer: 50 mL/min — ABNORMAL LOW (ref >60)
GFR calc non Af Amer: 43 mL/min — ABNORMAL LOW (ref >60)
Glucose, Bld: 84 mg/dL (ref 70–99)
Potassium: 3.7 mmol/L (ref 3.5–5.1)
Sodium: 138 mmol/L (ref 135–145)

## 2018-10-04 LAB — URINE CULTURE: Culture: >=100000 — AB

## 2018-10-04 LAB — MAGNESIUM: Magnesium: 1.8 mg/dL (ref 1.7–2.4)

## 2018-10-04 MED ORDER — POTASSIUM CHLORIDE CRYS ER 10 MEQ PO TBCR: 10.0000 meq | EXTENDED_RELEASE_TABLET | Freq: Every day | ORAL | 0 refills | Status: DC

## 2018-10-04 MED ORDER — APIXABAN 5 MG PO TABS: 5.0000 mg | ORAL_TABLET | Freq: Two times a day (BID) | ORAL | Status: DC

## 2018-10-04 MED ORDER — CEPHALEXIN 500 MG PO CAPS: 500.0000 mg | ORAL_CAPSULE | Freq: Two times a day (BID) | ORAL | 0 refills | Status: AC

## 2018-10-04 MED ORDER — FUROSEMIDE 40 MG PO TABS: 20.0000 mg | ORAL_TABLET | Freq: Every day | ORAL | 0 refills | Status: DC

## 2018-10-04 MED ORDER — RISAQUAD PO CAPS: 1.0000 | ORAL_CAPSULE | Freq: Every day | ORAL | 0 refills | Status: AC

## 2018-10-04 MED ORDER — ISOSORBIDE MONONITRATE ER 30 MG PO TB24: 30.0000 mg | ORAL_TABLET | Freq: Every day | ORAL | 0 refills | Status: DC

## 2018-10-04 MED ORDER — METOPROLOL SUCCINATE ER 25 MG PO TB24: 25.0000 mg | ORAL_TABLET | Freq: Every day | ORAL | 0 refills | Status: DC

## 2018-10-04 NOTE — Progress Notes (Signed)
SATURATION QUALIFICATIONS: (This note is used to comply with regulatory documentation for home oxygen)  Patient Saturations on Room Air at Rest = 100%  Patient Saturations on Room Air while Ambulating = 95%  Patient Saturations on N/A Liters of oxygen while Ambulating = N/A%  Please briefly explain why patient needs home oxygen:

## 2018-10-04 NOTE — Care Management Important Message (Signed)
Important Message  Patient Details  Name: [name] MRN: [id number] Date of Birth: 10/20/1932   Medicare Important Message Given:  Yes     Tommy Medal 10/04/2018, 1:35 PM

## 2018-10-04 NOTE — Progress Notes (Signed)
Nsg Discharge Note  Admit Date:  10/01/2018 Discharge date: 10/04/2018   [name] to be D/C'd Home per MD order.  AVS completed.  Copy for chart, and copy for patient signed, and dated. Patient/caregiver able to verbalize understanding. Removed IV-clean, dry, intact. Wheeled stable patient and belongings to short stay entrance where she was picked up by her daughter to d/c to home.  Discharge Medication: Allergies as of 10/04/2018      Reactions   Cephalosporins Diarrhea, Nausea Only   Lightheaded   Levaquin [levofloxacin In D5w] Swelling   Macrodantin [nitrofurantoin Macrocrystal] Swelling   Phenothiazines Anaphylaxis, Hives   Polysorbate Anaphylaxis   Prednisone Shortness Of Breath   Buspirone Itching   Cardura [doxazosin Mesylate] Itching   Codeine Itching   Acyclovir And Related Itching   Redness of skin   Colcrys [colchicine] Nausea Only   Prochlorperazine Other (See Comments)   "Upset stomach"   Ranexa [ranolazine]    Severe drop in BP   Atorvastatin Hives   Cramping; tolerates Crestor ok   Ofloxacin Rash   Other Itching, Rash   "WOOL"= make skin look like it has been burned   Penicillins Other (See Comments)   Causes redness all over. Has patient had a PCN reaction causing immediate rash, facial/tongue/throat swelling, SOB or lightheadedness with hypotension: No Has patient had a PCN reaction causing severe rash involving mucus membranes or skin necrosis: No Has patient had a PCN reaction that required hospitalization No Has patient had a PCN reaction occurring within the last 10 years: No If all of the above answers are "NO", then may proceed with Cephalosporin use.   Pimozide Hives, Itching      Medication List    STOP taking these medications   colchicine 0.6 MG tablet   lisinopril 2.5 MG tablet Commonly known as: ZESTRIL     TAKE these medications   acetaminophen 500 MG tablet Commonly known as: TYLENOL Take 500 mg by mouth every 6 (six) hours as  needed for headache.   acidophilus Caps capsule Take 1 capsule by mouth daily for 5 days. Start taking on: October 05, 2018   ALPRAZolam 0.25 MG tablet Commonly known as: XANAX Take 0.25 mg by mouth 2 (two) times daily as needed for anxiety.   apixaban 5 MG Tabs tablet Commonly known as: Eliquis Take 1 tablet (5 mg total) by mouth 2 (two) times daily. Resume with AM dose on 05/17/2018   cephALEXin 500 MG capsule Commonly known as: KEFLEX Take 1 capsule (500 mg total) by mouth 2 (two) times daily for 4 days.   DULoxetine 60 MG capsule Commonly known as: CYMBALTA Take 60 mg by mouth at bedtime.   fluticasone 50 MCG/ACT nasal spray Commonly known as: FLONASE Place 2 sprays into both nostrils 2 (two) times daily as needed for allergies.   furosemide 40 MG tablet Commonly known as: LASIX Take 0.5 tablets (20 mg total) by mouth daily. What changed: how much to take   HYDROcodone-acetaminophen 5-325 MG tablet Commonly known as: NORCO/VICODIN Take 1 tablet by mouth every 6 (six) hours as needed.   ipratropium-albuterol 0.5-2.5 (3) MG/3ML Soln Commonly known as: DUONEB Take 3 mLs by nebulization every 6 (six) hours.   isosorbide mononitrate 30 MG 24 hr tablet Commonly known as: IMDUR Take 1 tablet (30 mg total) by mouth daily. What changed:   medication strength  how much to take   levothyroxine 25 MCG tablet Commonly known as: SYNTHROID Take 1 tablet by mouth  daily.   magnesium oxide 400 MG tablet Commonly known as: MAG-OX TAKE ONE TABLET BY MOUTH DAILY.   methocarbamol 500 MG tablet Commonly known as: ROBAXIN Take 500 mg by mouth every 6 (six) hours as needed for muscle spasms.   metoprolol succinate 25 MG 24 hr tablet Commonly known as: TOPROL-XL Take 1 tablet (25 mg total) by mouth daily. Start taking on: October 05, 2018 What changed:   medication strength  how much to take  additional instructions   multivitamin with minerals Tabs tablet Take 1 tablet by  mouth daily. Centrum   nitroGLYCERIN 0.4 MG SL tablet Commonly known as: NITROSTAT Place 1 tablet (0.4 mg total) under the tongue every 5 (five) minutes as needed for chest pain. Reported on 08/04/2015   pantoprazole 40 MG tablet Commonly known as: PROTONIX Take 40 mg by mouth daily.   potassium chloride 10 MEQ tablet Commonly known as: K-DUR Take 1 tablet (10 mEq total) by mouth daily. What changed: See the new instructions.   sodium chloride 0.65 % Soln nasal spray Commonly known as: OCEAN Place 1 spray into both nostrils 2 (two) times daily.       Discharge Assessment: Vitals:   10/03/18 2141 10/04/18 0505  BP: 139/76 (!) 134/44  Pulse: 66 64  Resp: 17 17  Temp: 98.2 F (36.8 C) 98.2 F (36.8 C)  SpO2: 100% 98%   Skin clean, dry and intact without evidence of skin break down, no evidence of skin tears noted. IV catheter discontinued intact. Site without signs and symptoms of complications - no redness or edema noted at insertion site, patient denies c/o pain - only slight tenderness at site.  Dressing with slight pressure applied.  D/c Instructions-Education: Discharge instructions given to patient/family with verbalized understanding. D/c education completed with patient/family including follow up instructions, medication list, d/c activities limitations if indicated, with other d/c instructions as indicated by MD - patient able to verbalize understanding, all questions fully answered. Patient instructed to return to ED, call 911, or call MD for any changes in condition.  Patient escorted via [place], and D/C home via private auto.  Santa Lighter, RN 10/04/2018 2:55 PM

## 2018-10-06 NOTE — Discharge Summary (Signed)
[facility] Discharge Summary   Patient: [name] BPZ:[id number]   PCP: Rosalee Kaufman, PA-C DOB: [birth year]   Date of admission: 10/01/2018   Date of discharge: 10/04/2018     Discharge Diagnoses:  Principal diagnosis AKI Diarrhea Chest pain E. coli UTI Gout  Admitted From: Home Disposition:  Home home health  Recommendations for Outpatient Follow-up:  1. Please follow-up with PCP in 1 week.  Follow-up with cardiology as recommended.  Follow-up Information    Rosalee Kaufman, PA-C. Schedule an appointment as soon as possible for a visit in 1 week(s).   Specialty: Physician Assistant Contact information: [place] [address] [phone]        Herminio Commons, MD. Schedule an appointment as soon as possible for a visit in 1 month(s).   Specialty: Cardiology Contact information: [place] [address] [phone]          Diet recommendation: Cardiac diet  Activity: The patient is advised to gradually reintroduce usual activities,as tolerated .  Discharge Condition: good  Code Status: Full code  History of present illness: As per the H and P dictated on admission, " [name] is a 85 y.o. female with medical history significant for coronary artery disease, diastolic CHF, paroxysmal atrial fibrillation, CKD 3, hypertension and COPD who presented to the ED with complaints of 1 week of intermittent chest pain left-sided-just beneath her left breast, radiating down her left arm with numbness with associated difficulty breathing and nausea.  Pain is described as pressure-like.  Chest pain and difficulty breathing are provoked by activity, relieved by rest.  Patient said over the past week any little activity provokes her symptoms. She reports multiple episodes day and night of nonbloody watery stools over the past 2 weeks.  She had 5 episodes of watery stools yesterday.  She reports nausea without vomiting.  She  also endorses mild suprapubic abdominal pain with pain with urination. She reports some mild lower extremity swelling. She reports a weight of 165 2 weeks ago, and 150 yesterday.  She reports compliance with low-salt diet.  Also endorses compliance with her Eliquis twice a day.  Patient called her cardiologist 6/10, as she felt like she had extra weight around her abdomen, in addition to her chest pain symptoms.  Increase Lasix dose of 40 mg twice daily for 3 days was recommended.  And is now back to her regular dose of lasix.   ED Course: Blood pressure systolic 92 to 245Y, O2 sats greater than 99% on room air.  Creatinine 2.6, from baseline 1.3-1.5.  BNP 54.  Troponin less than 0.03.  Mild elevated liver enzymes AST 72, ALT 62, ALP 241, with normal bilirubin 0.9.  Second abdomen and pelvic CT without contrast-no acute abnormality, ectatic abdominal aorta at risk for aneurysm development. Portable chest x-ray negative for acute abnormality.  EKG had artifacts but no significant abnormalities.  Patient given 250 mill normal saline in the ED hospitalist to admit for acute kidney injury."  Hospital Course:  Summary of her active problems in the hospital is as following. Acute kidney injuryon CKD 3 creatinine 2.6, baseline 1.3-1.5. BUN 39, likely prerenal 2/2 twoweeks of diarrhea,diuretics and likely lisinopril contributing.  CT abdomen and pelvis negative for any hydronephrosis or obstruction. Blood pressure systolic down to 09X in the ED, hemoglobin 14.9 suggest hemoconcentration. Treated with IV fluids  Stop IV fluid for now.  Renal function back to baseline. -Hold home Lasix, lisinopril  Diarrhea watery stools for 2 weeks. Nausea.WBC 7.7. Afebrile. She was also recently started on colchicine. -Negative stoolC. difficile, and GI pathogen panel -Hold colchicine for now  Exertional chest painwith CAD hxwith stent placement- Typicalanginal symptoms. Serial troponin  <0.03. Initial EKG with artifacts, but repeat shows slight Distal ST segment /T wave abnormality in diffuse leads- II, AVF, V3- V6.  Compliantwith Eliquis-so doubt Pulmonary Embolism. Follows with Dr. Max Sane. Last cardiac cath 04/2018, 20-30% proximal LAD irregularity, nondominant right coronary with ostial 90% narrowing, 30 to 40% mid to distal circumflex irregularities. Findings unchanged from 2017 cath. -Continue home Eliquis, metoprolol -Not on statins, Allergy listed -echocardiogram shows preserved EF no acute abnormality. -Cardiology consulted appreciate assistance no further inpatient work-up recommended.  E. coli UTI Suprapubic pain, Dysuria. UA consistent withmany bacteria,>50 WBCs, modleukocytes. Afebrile. -Follow-up urine cultures, now growing E. coli sensitivities pending. -Extensive allergy list including penicillin and cephalosporins and quinolones.  Initially was on IV aztreonam although currently tolerating IV Rocephin.  Will continue the same antibiotic.  Elevated liver enzymes- AST 72, ALT 62, ALP 241. Normal total bili 0.9.  Normal liver enzymes 3 months ago.  Abdominal CT negative for acute abnormality. Acute hepatitis panel pending LFTs back to normal  Incidental ectatic abdominal aorta Ct abd+ pelvis Wo Contrast Ectatic abdominal aorta at risk for aneurysm development. Recommend followup by ultrasound in 5 years.  History of diastolic CHF-compensated.  BNP 54. Chest x-ray clear. Last echo- TEE 09/2017-45%, diffuse hypokinesis. -Hold Lasix for now with AKI -Continue home metoprolol  Paroxysmal atrial fibrillation- currently in sinus rhythm. Rate controlled and anticoagulated. -Continue home Eliquis -Continue Toprol-XL, 25 mg daily (home dose 50 mg daily)  Hypertension- hypotensive to soft blood pressure. -Hold home lisinopril, Imdur, Lasix and K-Dur supplements -Continue Toprol-XL, 25 mg daily (home dose 50 mg daily)  COPD,  Gout -stable. No wheezing.Not on home oxygen.  -Hold colchicine for now  Hypothyroidism-  -resume home meds.  Patient was seen by physical therapy, who recommended Home health, which was arranged by case manager. On the day of the discharge the patient's vitals are stable, and no other acute medical condition were reported by patient. the patient was felt safe to be discharge at Home with home health.  Consultants: cardiology  Procedures: Echocardiogram   DISCHARGE MEDICATION: Allergies as of 10/04/2018      Reactions   Cephalosporins Diarrhea, Nausea Only   Lightheaded   Levaquin [levofloxacin In D5w] Swelling   Macrodantin [nitrofurantoin Macrocrystal] Swelling   Phenothiazines Anaphylaxis, Hives   Polysorbate Anaphylaxis   Prednisone Shortness Of Breath   Buspirone Itching   Cardura [doxazosin Mesylate] Itching   Codeine Itching   Acyclovir And Related Itching   Redness of skin   Colcrys [colchicine] Nausea Only   Prochlorperazine Other (See Comments)   "Upset stomach"   Ranexa [ranolazine]    Severe drop in BP   Atorvastatin Hives   Cramping; tolerates Crestor ok   Ofloxacin Rash   Other Itching, Rash   "WOOL"= make skin look like it has been burned   Penicillins Other (See Comments)   Causes redness all over. Has patient had a PCN reaction causing immediate rash, facial/tongue/throat swelling, SOB or lightheadedness with hypotension: No Has patient had a PCN reaction causing severe rash involving mucus membranes or skin necrosis: No Has patient had a PCN reaction that required hospitalization No Has patient had a PCN reaction occurring within the last 10 years: No If all of the above answers are "NO", then may  proceed with Cephalosporin use.   Pimozide Hives, Itching      Medication List    STOP taking these medications   colchicine 0.6 MG tablet   lisinopril 2.5 MG tablet Commonly known as: ZESTRIL     TAKE these medications   acetaminophen 500 MG  tablet Commonly known as: TYLENOL Take 500 mg by mouth every 6 (six) hours as needed for headache.   acidophilus Caps capsule Take 1 capsule by mouth daily for 5 days.   ALPRAZolam 0.25 MG tablet Commonly known as: XANAX Take 0.25 mg by mouth 2 (two) times daily as needed for anxiety.   apixaban 5 MG Tabs tablet Commonly known as: Eliquis Take 1 tablet (5 mg total) by mouth 2 (two) times daily. Resume with AM dose on 05/17/2018   cephALEXin 500 MG capsule Commonly known as: KEFLEX Take 1 capsule (500 mg total) by mouth 2 (two) times daily for 4 days.   DULoxetine 60 MG capsule Commonly known as: CYMBALTA Take 60 mg by mouth at bedtime.   fluticasone 50 MCG/ACT nasal spray Commonly known as: FLONASE Place 2 sprays into both nostrils 2 (two) times daily as needed for allergies.   furosemide 40 MG tablet Commonly known as: LASIX Take 0.5 tablets (20 mg total) by mouth daily. What changed: how much to take   HYDROcodone-acetaminophen 5-325 MG tablet Commonly known as: NORCO/VICODIN Take 1 tablet by mouth every 6 (six) hours as needed.   ipratropium-albuterol 0.5-2.5 (3) MG/3ML Soln Commonly known as: DUONEB Take 3 mLs by nebulization every 6 (six) hours.   isosorbide mononitrate 30 MG 24 hr tablet Commonly known as: IMDUR Take 1 tablet (30 mg total) by mouth daily. What changed:   medication strength  how much to take   levothyroxine 25 MCG tablet Commonly known as: SYNTHROID Take 1 tablet by mouth daily.   magnesium oxide 400 MG tablet Commonly known as: MAG-OX TAKE ONE TABLET BY MOUTH DAILY.   methocarbamol 500 MG tablet Commonly known as: ROBAXIN Take 500 mg by mouth every 6 (six) hours as needed for muscle spasms.   metoprolol succinate 25 MG 24 hr tablet Commonly known as: TOPROL-XL Take 1 tablet (25 mg total) by mouth daily. What changed:   medication strength  how much to take  additional instructions   multivitamin with minerals Tabs tablet  Take 1 tablet by mouth daily. Centrum   nitroGLYCERIN 0.4 MG SL tablet Commonly known as: NITROSTAT Place 1 tablet (0.4 mg total) under the tongue every 5 (five) minutes as needed for chest pain. Reported on 08/04/2015   pantoprazole 40 MG tablet Commonly known as: PROTONIX Take 40 mg by mouth daily.   potassium chloride 10 MEQ tablet Commonly known as: K-DUR Take 1 tablet (10 mEq total) by mouth daily. What changed: See the new instructions.   sodium chloride 0.65 % Soln nasal spray Commonly known as: OCEAN Place 1 spray into both nostrils 2 (two) times daily.      Allergies  Allergen Reactions  . Cephalosporins Diarrhea and Nausea Only    Lightheaded  . Levaquin [Levofloxacin In D5w] Swelling  . Macrodantin [Nitrofurantoin Macrocrystal] Swelling  . Phenothiazines Anaphylaxis and Hives  . Polysorbate Anaphylaxis  . Prednisone Shortness Of Breath  . Buspirone Itching  . Cardura [Doxazosin Mesylate] Itching  . Codeine Itching  . Acyclovir And Related Itching    Redness of skin  . Colcrys [Colchicine] Nausea Only  . Prochlorperazine Other (See Comments)    "Upset stomach"  .  Ranexa [Ranolazine]     Severe drop in BP  . Atorvastatin Hives    Cramping; tolerates Crestor ok  . Ofloxacin Rash  . Other Itching and Rash    "WOOL"= make skin look like it has been burned  . Penicillins Other (See Comments)    Causes redness all over. Has patient had a PCN reaction causing immediate rash, facial/tongue/throat swelling, SOB or lightheadedness with hypotension: No Has patient had a PCN reaction causing severe rash involving mucus membranes or skin necrosis: No Has patient had a PCN reaction that required hospitalization No Has patient had a PCN reaction occurring within the last 10 years: No If all of the above answers are "NO", then may proceed with Cephalosporin use.   . Pimozide Hives and Itching   Discharge Instructions    Diet - low sodium heart healthy   Complete by:  As directed    Increase activity slowly   Complete by: As directed      Discharge Exam: Filed Weights   10/01/18 1153 10/01/18 1739  Weight: 70.5 kg 71.8 kg   Vitals:   10/03/18 2141 10/04/18 0505  BP: 139/76 (!) 134/44  Pulse: 66 64  Resp: 17 17  Temp: 98.2 F (36.8 C) 98.2 F (36.8 C)  SpO2: 100% 98%   General: Appear in mild distress, no Rash; Oral Mucosa Clear, moist. no Abnormal Mass Or lumps Cardiovascular: S1 and S2 Present, no Murmur, Respiratory: normal respiratory effort, Bilateral Air entry present and Clear to Auscultation, no Crackles, no wheezes Abdomen: Bowel Sound present, Soft and no tenderness, no hernia Extremities: no Pedal edema, no calf tenderness Neurology: alert and oriented to time, place, and person affect appropriate. normal without focal findings, mental status, speech normal, alert and oriented x3, PERLA, Motor strength 5/5 and symmetric and sensation grossly normal to light touch   The results of significant diagnostics from this hospitalization (including imaging, microbiology, ancillary and laboratory) are listed below for reference.    Significant Diagnostic Studies: Ct Abdomen Pelvis Wo Contrast  Result Date: 10/01/2018 CLINICAL DATA:  Abnormal liver function tests. Abdominal discomfort nausea and diarrhea. Chest pain. EXAM: CT ABDOMEN AND PELVIS WITHOUT CONTRAST TECHNIQUE: Multidetector CT imaging of the abdomen and pelvis was performed following the standard protocol without IV contrast. COMPARISON:  CT scan dated 08/31/2011 FINDINGS: Lower chest: Extensive coronary artery calcification. Aortic atherosclerosis. Heart size is normal. Minimal chronic interstitial disease at the right lung base, slightly progressed. Hepatobiliary: Previous cholecystectomy. Two small calcified granulomas in the liver of no significance. Liver parenchyma is otherwise normal. No biliary ductal dilatation. Pancreas: Normal. Spleen: Normal. Adrenals/Urinary Tract: Normal  adrenal glands. Bilateral renal atrophy. No mass lesions or hydronephrosis. Normal bladder. Stomach/Bowel: The stomach, small bowel, and colon appear normal. Appendix has been removed. Vascular/Lymphatic: No adenopathy. Extensive aortic atherosclerosis. Focal saccular dilatation of the distal abdominal aorta to a diameter of 2.8 cm, new since the study of 2013. Reproductive: Status post hysterectomy. No adnexal masses. Ovaries appear normal. Other: No abdominal wall hernia or ascites. Musculoskeletal: No acute abnormality. Lumbar fusion from L2 through L5. Posterior decompression at those levels. IMPRESSION: No acute abnormality of the abdomen or pelvis. Aortic Atherosclerosis (ICD10-I70.0). Focal slight dilatation of the abdominal aorta to 2.8 cm. Ectatic abdominal aorta at risk for aneurysm development. Recommend followup by ultrasound in 5 years. This recommendation follows ACR consensus guidelines: White Paper of the ACR Incidental Findings Committee II on Vascular Findings. J Am Coll Radiol 2013; 10:789-794. Aortic aneurysm NOS (ICD10-I71.9) Electronically  Signed   By: Lorriane Shire M.D.   On: 10/01/2018 15:34   US Arterial Abi (screening Lower Extremity)  Result Date: 10/04/2018 CLINICAL DATA:  85 year old female with a history of smoking EXAM: NONINVASIVE PHYSIOLOGIC VASCULAR STUDY OF BILATERAL LOWER EXTREMITIES TECHNIQUE: Evaluation of both lower extremities was performed at rest, including calculation of ankle-brachial indices, multiple segmental pressure evaluation, segmental Doppler and segmental pulse volume recording. COMPARISON:  None. FINDINGS: Right ABI:  0.85 Left ABI:  0.93 Right Lower Extremity: Segmental Doppler at the right ankle demonstrates biphasic waveform of the posterior tibial artery and triphasic waveform of dorsalis pedis Left Lower Extremity: Segmental Doppler at the left ankle demonstrates biphasic waveform of posterior tibial and dorsalis pedis. IMPRESSION: Right: Resting ABI in  the mild range arterial occlusive disease, with ankle Doppler segmental exam demonstrating developing tibial disease. Left: Resting ABI within normal limits with the segmental exam demonstrating at least tibial disease. Signed, Dulcy Fanny. Dellia Nims, RPVI [facility] Electronically Signed   By: Corrie Mckusick D.O.   On: 10/04/2018 12:28   Dg Chest Port 1 View  Result Date: 10/01/2018 CLINICAL DATA:  Chest pain, shortness of breath EXAM: PORTABLE CHEST 1 VIEW COMPARISON:  06/29/2018 FINDINGS: There is mild bilateral chronic interstitial thickening. There is no focal consolidation. There is no pleural effusion or pneumothorax. The heart and mediastinal contours are unremarkable. There is no acute osseous abnormality. IMPRESSION: No active disease. Electronically Signed   By: Kathreen Devoid   On: 10/01/2018 13:27    Microbiology: Recent Results (from the past 240 hour(s))  SARS Coronavirus 2 (CEPHEID- Performed in [place] hospital lab), Hosp Order     Status: None   Collection Time: 10/01/18  3:42 PM   Specimen: Nasopharyngeal Swab  Result Value Ref Range Status   SARS Coronavirus 2 NEGATIVE NEGATIVE Final    Comment: (NOTE) If result is NEGATIVE SARS-CoV-2 target nucleic acids are NOT DETECTED. The SARS-CoV-2 RNA is generally detectable in upper and lower  respiratory specimens during the acute phase of infection. The lowest  concentration of SARS-CoV-2 viral copies this assay can detect is 250  copies / mL. A negative result does not preclude SARS-CoV-2 infection  and should not be used as the sole basis for treatment or other  patient management decisions.  A negative result may occur with  improper specimen collection / handling, submission of specimen other  than nasopharyngeal swab, presence of viral mutation(s) within the  areas targeted by this assay, and inadequate number of viral copies  (<250 copies / mL). A negative result  must be combined with clinical  observations, patient history, and epidemiological information. If result is POSITIVE SARS-CoV-2 target nucleic acids are DETECTED. The SARS-CoV-2 RNA is generally detectable in upper and lower  respiratory specimens dur ing the acute phase of infection.  Positive  results are indicative of active infection with SARS-CoV-2.  Clinical  correlation with patient history and other diagnostic information is  necessary to determine patient infection status.  Positive results do  not rule out bacterial infection or co-infection with other viruses. If result is PRESUMPTIVE POSTIVE SARS-CoV-2 nucleic acids MAY BE PRESENT.   A presumptive positive result was obtained on the submitted specimen  and confirmed on repeat testing.  While 2019 novel coronavirus  (SARS-CoV-2) nucleic acids may be present in the submitted sample  additional confirmatory testing may be necessary for epidemiological  and / or clinical management purposes  to differentiate between  SARS-CoV-2 and other Sarbecovirus currently known to infect humans.  If clinically indicated additional testing with an alternate test  methodology [phone]) is advised. The SARS-CoV-2 RNA is generally  detectable in upper and lower respiratory sp ecimens during the acute  phase of infection. The expected result is Negative. Fact Sheet for Patients:  StrictlyIdeas.no Fact Sheet for Healthcare Providers: BankingDealers.co.za This test is not yet approved or cleared by the [place] FDA and has been authorized for detection and/or diagnosis of SARS-CoV-2 by FDA under an Emergency Use Authorization (EUA).  This EUA will remain in effect (meaning this test can be used) for the duration of the COVID-19 declaration under Section 564(b)(1) of the Act, 21 U.S.C. section 360bbb-3(b)(1), unless the authorization is terminated or revoked sooner. Performed at [facility], [address] [place], [place] [address]   Gastrointestinal Panel by PCR , Stool     Status: None   Collection Time: 10/01/18  4:28 PM   Specimen: Stool  Result Value Ref Range Status   Campylobacter species NOT DETECTED NOT DETECTED Final   Plesimonas shigelloides NOT DETECTED NOT DETECTED Final   Salmonella species NOT DETECTED NOT DETECTED Final   Yersinia enterocolitica NOT DETECTED NOT DETECTED Final   Vibrio species NOT DETECTED NOT DETECTED Final   Vibrio cholerae NOT DETECTED NOT DETECTED Final   Enteroaggregative E coli (EAEC) NOT DETECTED NOT DETECTED Final   Enteropathogenic E coli (EPEC) NOT DETECTED NOT DETECTED Final   Enterotoxigenic E coli (ETEC) NOT DETECTED NOT DETECTED Final   Shiga like toxin producing E coli (STEC) NOT DETECTED NOT DETECTED Final   Shigella/Enteroinvasive E coli (EIEC) NOT DETECTED NOT DETECTED Final   Cryptosporidium NOT DETECTED NOT DETECTED Final   Cyclospora cayetanensis NOT DETECTED NOT DETECTED Final   Entamoeba histolytica NOT DETECTED NOT DETECTED Final   Giardia lamblia NOT DETECTED NOT DETECTED Final   Adenovirus F40/41 NOT DETECTED NOT DETECTED Final   Astrovirus NOT DETECTED NOT DETECTED Final   Norovirus GI/GII NOT DETECTED NOT DETECTED Final   Rotavirus A NOT DETECTED NOT DETECTED Final   Sapovirus (I, II, IV, and V) NOT DETECTED NOT DETECTED Final    Comment: Performed at [facility], [address] [place], [place] [address]  C difficile quick scan w PCR reflex     Status: None   Collection Time: 10/01/18  5:30 PM   Specimen: STOOL  Result Value Ref Range Status   C Diff antigen NEGATIVE NEGATIVE Final   C Diff toxin NEGATIVE NEGATIVE Final   C Diff interpretation No C. difficile detected.  Final    Comment: Performed at [facility], [address] [place], [place] [address]  Culture, Urine     Status: Abnormal   Collection Time: 10/01/18  5:35 PM   Specimen: Urine, Random  Result Value Ref Range Status    Specimen Description   Final    URINE, RANDOM Performed at [facility], [address] [place], [place] [address]    Special Requests   Final    NONE Performed at [facility], [address], [place], [place] [address]    Culture >=100,000 COLONIES/mL ESCHERICHIA COLI (A)  Final   Report Status 10/04/2018 FINAL  Final   Organism ID, Bacteria ESCHERICHIA COLI (A)  Final      Susceptibility   Escherichia coli - MIC*    AMPICILLIN 8 SENSITIVE Sensitive     CEFAZOLIN <=4 SENSITIVE Sensitive     CEFTRIAXONE <=1 SENSITIVE Sensitive  CIPROFLOXACIN <=0.25 SENSITIVE Sensitive     GENTAMICIN <=1 SENSITIVE Sensitive     IMIPENEM <=0.25 SENSITIVE Sensitive     NITROFURANTOIN <=16 SENSITIVE Sensitive     TRIMETH/SULFA <=20 SENSITIVE Sensitive     AMPICILLIN/SULBACTAM <=2 SENSITIVE Sensitive     PIP/TAZO <=4 SENSITIVE Sensitive     Extended ESBL NEGATIVE Sensitive     * >=100,000 COLONIES/mL ESCHERICHIA COLI     Labs: CBC: Recent Labs  Lab 10/01/18 1227 10/03/18 0442  WBC 7.7 4.9  NEUTROABS 4.5 2.4  HGB 14.7 12.2  HCT 44.2 37.8  MCV 92.1 95.0  PLT 265 701   Basic Metabolic Panel: Recent Labs  Lab 10/01/18 1227 10/01/18 1604 10/02/18 0538 10/03/18 0442 10/04/18 0345  NA 133*  --  136 138 138  K 4.2  --  3.9 4.0 3.7  CL 101  --  107 110 109  CO2 20*  --  21* 21* 22  GLUCOSE 97  --  85 78 84  BUN 39*  --  38* 25* 16  CREATININE 2.60*  --  2.14* 1.60* 1.17*  CALCIUM 9.2  --  8.6* 8.7* 8.7*  MG  --  2.1  --  1.9 1.8   Liver Function Tests: Recent Labs  Lab 10/01/18 1227 10/02/18 0538 10/03/18 0442  AST 72* 40 31  ALT 62* 42 34  ALKPHOS 241* 186* 171*  BILITOT 0.9 0.6 0.6  PROT 7.5 6.2* 5.9*  ALBUMIN 4.2 3.3* 3.1*   No results for input(s): LIPASE, AMYLASE in the last 168 hours. No results for input(s): AMMONIA in the last 168 hours. Cardiac Enzymes: Recent Labs  Lab 10/01/18 1227 10/01/18 1843 10/02/18 0018  TROPONINI <0.03 <0.03 <0.03   BNP (last 3  results) Recent Labs    10/01/18 1227  BNP 54.0   CBG: Recent Labs  Lab 10/02/18 1611  GLUCAP 107*   Time spent: 35 minutes  Signed:  Berle Mull  [facility] 10/04/2018

## 2018-10-17 DIAGNOSIS — M109 Gout, unspecified: Secondary | ICD-10-CM | POA: Diagnosis not present

## 2018-10-17 DIAGNOSIS — I480 Paroxysmal atrial fibrillation: Secondary | ICD-10-CM | POA: Diagnosis not present

## 2018-10-17 DIAGNOSIS — R197 Diarrhea, unspecified: Secondary | ICD-10-CM | POA: Diagnosis not present

## 2018-10-17 DIAGNOSIS — M545 Low back pain: Secondary | ICD-10-CM | POA: Diagnosis not present

## 2018-10-17 DIAGNOSIS — R079 Chest pain, unspecified: Secondary | ICD-10-CM | POA: Diagnosis not present

## 2018-10-17 DIAGNOSIS — I10 Essential (primary) hypertension: Secondary | ICD-10-CM | POA: Diagnosis not present

## 2018-10-17 DIAGNOSIS — J449 Chronic obstructive pulmonary disease, unspecified: Secondary | ICD-10-CM | POA: Diagnosis not present

## 2018-10-17 DIAGNOSIS — Z6831 Body mass index (BMI) 31.0-31.9, adult: Secondary | ICD-10-CM | POA: Diagnosis not present

## 2018-10-31 ENCOUNTER — Ambulatory Visit (INDEPENDENT_AMBULATORY_CARE_PROVIDER_SITE_OTHER): Payer: Medicare Other

## 2018-10-31 ENCOUNTER — Ambulatory Visit (INDEPENDENT_AMBULATORY_CARE_PROVIDER_SITE_OTHER): Payer: Medicare Other | Admitting: Orthopaedic Surgery

## 2018-10-31 ENCOUNTER — Other Ambulatory Visit: Payer: Self-pay

## 2018-10-31 ENCOUNTER — Encounter: Payer: Self-pay | Admitting: Orthopaedic Surgery

## 2018-10-31 VITALS — BP 122/62 | HR 92 | Ht 61.0 in | Wt 162.0 lb

## 2018-10-31 DIAGNOSIS — I25118 Atherosclerotic heart disease of native coronary artery with other forms of angina pectoris: Secondary | ICD-10-CM

## 2018-10-31 DIAGNOSIS — M79671 Pain in right foot: Secondary | ICD-10-CM | POA: Diagnosis not present

## 2018-10-31 DIAGNOSIS — M545 Low back pain: Secondary | ICD-10-CM | POA: Diagnosis not present

## 2018-10-31 DIAGNOSIS — G8929 Other chronic pain: Secondary | ICD-10-CM

## 2018-10-31 MED ORDER — COLCHICINE 0.6 MG PO TABS: 0.6000 mg | ORAL_TABLET | Freq: Every day | ORAL | 0 refills | Status: DC

## 2018-10-31 NOTE — Progress Notes (Addendum)
Office Visit Note   Patient: [name]           Date of Birth: 10-29-1932           MRN: [id number] Visit Date: 10/31/2018              Requested by: Rosalee Kaufman, PA-C [place],  [place] [address] PCP: Rosalee Kaufman, PA-C   Assessment & Plan: Visit Diagnoses:  1. Chronic midline low back pain without sciatica   2. Pain in right foot     Plan: I discussed the patient's needs to follow-up with Dr. Ellene Route and proceed with myelogram CT scan to see if she is a candidate for possible surgery for her back which might help her pain.  Colchicine prescribed for gout she needs to talk with her PCP about starting Uloric and continue the colchicine for several weeks as she gradually ramps up to appropriate dosage to handle her hyperuricemia.  She is on chronic Eliquis and I do not think oral steroids at this time would be a good idea since she would be at increased risk for GI bleeding.  If insurance does not cover her Colcrys then she might be able to take a low-dose allopurinol.  Follow-Up Instructions: No follow-ups on file.   Orders:  Orders Placed This Encounter  Procedures  . XR Lumbar Spine 2-3 Views  . XR Foot Complete Right   No orders of the defined types were placed in this encounter.     Procedures: No procedures performed   Clinical Data: No additional findings.   Subjective: Chief Complaint  Patient presents with  . Lower Back - Pain    Surgery 2015 Dr Cyndy Freeze   . Right Foot - Pain    HPI 85 year old female first-time visit with complaints of right fourth toe gout and also history of multiple back surgeries.  She had surgery by Dr. Joya Salm early later surgeries by Dr. Cyndy Freeze who has since no longer with the group.  She was scheduled for myelogram CAT scan with Dr. Ellene Route but had to cancel this since she was in the hospital with heart failure problems.  Previous fusions done at 4 5, 3 4.  Lateral fusion done in 2 3 with some collapse and  subsidence of the graft.  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  She has had a uric acid greater than 10 noted in epic and x-rays today shows DIP erosive wear marginal consistent with gouty changes.  Her fusions from L3-L5 appear solid.  She has responded to Colcrys in the past but states when she took too much she had diarrhea.  She has had history of podagra usually more so on the left great toe than the right.  Also has some problems with olecranon bursa swelling on the right not current.  Yes-man remember lunch taking all the lunch  Review of Systems 14 point review of systems positive for lumbar spinal spondylosis with multiple fusions, depression, gout, chronic diastolic heart failure, PAT, mild cognitive impairment history of CSF leak, meningitis, postoperative infection after lumbar fusion.  Kidney failure with current elevated creatinine.  Chronic Xanax use, positive for acid reflux anxiety bladder problems depression history of DVT heart disease, thyroid condition, sleep apnea.  Otherwise negative is obtains HPI.   Objective: Vital Signs: BP 122/62   Pulse 92   Ht 5\' 1"  (1.549 m)   Wt 162 lb (73.5 kg)   BMI  30.61 kg/m   Physical Exam Constitutional:      Appearance: She is well-developed.  HENT:     Head: Normocephalic.     Right Ear: External ear normal.     Left Ear: External ear normal.  Eyes:     Pupils: Pupils are equal, round, and reactive to light.  Neck:     Thyroid: No thyromegaly.     Trachea: No tracheal deviation.  Cardiovascular:     Rate and Rhythm: Normal rate.  Pulmonary:     Effort: Pulmonary effort is normal.     Breath sounds: No wheezing.  Abdominal:     Palpations: Abdomen is soft.  Skin:    General: Skin is warm and dry.  Neurological:     Mental Status: She is alert and oriented to person, place, and time.  Psychiatric:        Behavior: Behavior normal.     Ortho Exam patient has  well-healed lumbar incision.  There is swelling erythema slight increased warmth to the fourth toe consistent with acute gout.  Specialty Comments:  No specialty comments available.  Imaging: No results found.   PMFS History: Patient Active Problem List   Diagnosis Date Noted  . AKI (acute kidney injury) ([place]) 10/01/2018  . Persistent atrial fibrillation 12/30/2017  . Chronic combined systolic and diastolic congestive heart failure ([place]) 12/30/2017  . Hyperammonemia ([place]) 02/20/2017  . Myoclonic jerking 02/20/2017  . Atrial fibrillation ([place]) 02/19/2017  . Acute gout 02/17/2017  . Chronic systolic heart failure ([place])   . Chest pain 09/30/2016  . Status post lumbar spine surgery for decompression of spinal cord 08/07/2016  . Lumbosacral spondylosis with radiculopathy 08/01/2016  . Preoperative clearance 07/05/2016  . Cough 05/26/2016  . Weakness 04/26/2016  . Nausea without vomiting 04/26/2016  . Atrial fibrillation with RVR ([place]) 04/15/2016  . DOE (dyspnea on exertion) 03/20/2016  . Hypoxia   . Acute on chronic diastolic congestive heart failure ([place])   . Chronic kidney disease, stage III (moderate) ([place]) 02/17/2016  . Essential hypertension 02/17/2016  . COPD exacerbation ([place]) 02/17/2016  . Acute bronchitis 12/18/2015  . CAD in native artery   . Chest pain at rest 10/15/2015  . Chronic low back pain 10/15/2015  . Hyponatremia 10/15/2015  . Hyperglycemia 10/15/2015  . Thrombocytosis ([place]) 10/15/2015  . Atypical chest pain 10/15/2015  . Depression   . Anxiety   . Gastroesophageal reflux disease without esophagitis   . Mild cognitive impairment 10/14/2015  . Iron deficiency anemia due to chronic blood loss   . Meningitis 10/01/2015  . Coronary artery disease due to lipid rich plaque   . NSVT (nonsustained ventricular tachycardia) ([place])   . PAF (paroxysmal atrial fibrillation) ([place])   . CSF leak 09/27/2015  . Spondylolisthesis of lumbar region 09/24/2015  . Gastric  polyp   . History of colonic polyps   . Chronic diarrhea   . Esophageal dysphagia 04/01/2015  . PAOD (peripheral arterial occlusive disease) ([place]) 03/05/2015  . Pain in the chest   . Hyperlipidemia   . Weight gain 08/12/2014  . Anemia 08/07/2014  . Hemorrhoids 08/06/2014  . CHF (congestive heart failure) ([place]) 07/29/2014  . Rectal bleeding 07/06/2014  . Constipation 07/06/2014  . Lower extremity edema 11/24/2013  . Acute kidney failure ([place]) 11/24/2013  . Hypokalemia 11/24/2013  . Other and unspecified angina pectoris 05/14/2013  . Hematochezia 02/13/2011  . Abdominal pain 02/13/2011  . Major depression ([place]) 09/28/2010  . FATIGUE 04/13/2009  . Chronic  diastolic heart failure ([place]) 11/30/2008  . DYSPNEA 11/30/2008  . CAD, NATIVE VESSEL - PCI + DES to left circumflex 05/14/13 11/27/2008  . Peripheral vascular disease ([place]) 11/27/2008  . PANIC ATTACK 02/28/2008  . MI 02/28/2008  . Internal hemorrhoids 02/28/2008  . COLITIS 02/28/2008  . Dysphagia 02/28/2008   Past Medical History:  Diagnosis Date  . Anxiety   . Arthritis   . Atrial fibrillation ([place])   . Bursitis    Left shoulder  . Cataract   . CHF (congestive heart failure) ([place])   . COPD (chronic obstructive pulmonary disease) ([place])   . Coronary atherosclerosis of native coronary artery    a. s/p DES to LCx in 04/2013 b. cath in 11/2015 showing patent stent with 20% prox-LAD and 80% ostial RCA stenosis for which medical management was recommended due to small artery size  . Depression   . Diastolic heart failure ([facility])    EF 55-60%  . Dysphagia, unspecified(787.20)   . Dyspnea   . Dysrhythmia   . Essential hypertension   . GERD (gastroesophageal reflux disease)    Hx Schatzki's ring, multiple EGD/ED last 01/06/2004  . Gout   . Headache   . History of anemia   . Hyperlipidemia   . Internal hemorrhoids without mention of complication   . MI (myocardial infarction) ([place]) 2006  . Microscopic colitis 2003  . Panic  disorder without agoraphobia   . Paresthesia   . Pneumonia 12/2011  . PVD (peripheral vascular disease) ([place])   . S/P colonoscopy 09/27/2001   internal hemorrhoids, desc colon inflam polyp, SB BX-chronic duodenitis, colitis  . Thyroid disease     Family History  Problem Relation Age of Onset  . Stroke Mother   . Parkinson's disease Father   . Coronary artery disease Other        family Hx-sons  . Cancer Other   . Stroke Other        family Hx  . Hypertension Other        family Hx  . Diabetes Brother   . Heart disease Son        before age 79  . Diabetes Son   . Stroke Daughter 75  . Colon cancer Neg Hx     Past Surgical History:  Procedure Laterality Date  . ABDOMINAL HYSTERECTOMY    . ABDOMINAL HYSTERECTOMY    . ANTERIOR AND POSTERIOR REPAIR     with resection of vagina  . ANTERIOR LAT LUMBAR FUSION N/A 08/01/2016   Procedure: Lumbar Two-Lumbar Five Transpsoas lateral interbody fusion with Lumbar Two-Three lateral plate fixation;  Surgeon: Kevan Ny Ditty, MD;  Location: [facility];  Service: Neurosurgery;  Laterality: N/A;  L2-5 Transpsoas lateral interbody fusion with L2-3 lateral plate fixation  . APPENDECTOMY    . BACK SURGERY    . BIOPSY  07/05/2015   Procedure: BIOPSY;  Surgeon: Daneil Dolin, MD;  [facility];  Service: Endoscopy;;  gastric polyp biopsy, ascending colon biopsy  . BLADDER SUSPENSION  11/09/2011   Procedure: TRANSVAGINAL TAPE (TVT) PROCEDURE;  Surgeon: Marissa Nestle, MD;  Location: [facility];  Service: Urology;  Laterality: N/A;  . bladder tack  06/2010  . BREAST LUMPECTOMY  1998   left, benign  . CARDIAC CATHETERIZATION    . CARDIAC CATHETERIZATION    . CARDIAC CATHETERIZATION N/A 12/16/2015   Procedure: Left Heart Cath and Coronary Angiography;  Surgeon: Troy Sine, MD;  Location: [facility];  Service:  Cardiovascular;  Laterality: N/A;  . CARDIOVERSION N/A 10/04/2017   Procedure: CARDIOVERSION;  Surgeon: Herminio Commons,  MD;  Location: [facility];  Service: Cardiovascular;  Laterality: N/A;  . CARDIOVERSION N/A 01/30/2018   Procedure: CARDIOVERSION;  Surgeon: Herminio Commons, MD;  [facility];  Service: Cardiovascular;  Laterality: N/A;  . [place]   left  . cataract surgery    . CHOLECYSTECTOMY  1998  . Cholecystectomy    . COLONOSCOPY  03/16/2011   multiple hyperplastic colon polyps, sigmoid diverticulosis, melanosis coli  . COLONOSCOPY WITH PROPOFOL N/A 07/05/2015   RMR:one 5 mm polyp in descending colon  . CORONARY ANGIOGRAPHY N/A 05/16/2018   Procedure: CORONARY ANGIOGRAPHY (CATH LAB);  Surgeon: Belva Crome, MD;  Location: [facility];  Service: Cardiovascular;  Laterality: N/A;  . CORONARY ANGIOPLASTY WITH STENT PLACEMENT    . ESOPHAGEAL DILATION N/A 07/05/2015   Procedure: ESOPHAGEAL DILATION;  Surgeon: Daneil Dolin, MD;  [facility];  Service: Endoscopy;  Laterality: N/A;  . ESOPHAGOGASTRODUODENOSCOPY (EGD) WITH PROPOFOL N/A 07/05/2015   [id number]  . JOINT REPLACEMENT Right 2007   right knee  . left hand surgery    . LEFT HEART CATHETERIZATION WITH CORONARY ANGIOGRAM N/A 05/14/2013   Procedure: LEFT HEART CATHETERIZATION WITH CORONARY ANGIOGRAM;  Surgeon: Blane Ohara, MD;  Location[facility] LAB;  Service: Cardiovascular;  Laterality: N/A;  . left rotator cuff surgery    . LUMBAR LAMINECTOMY/DECOMPRESSION MICRODISCECTOMY N/A 10/11/2012   Procedure: LUMBAR LAMINECTOMY/DECOMPRESSION MICRODISCECTOMY 2 LEVELS;  Surgeon: Floyce Stakes, MD;  Location: [facility];  Service: Neurosurgery;  Laterality: N/A;  L3-4 L4-5 Laminectomy  . LUMBAR WOUND DEBRIDEMENT N/A 09/27/2015   Procedure: Exploration of Lumbar Wound w/ Repair CSF Leak/Lumbar Drain Placement;  Surgeon: Leeroy Cha, MD;  Location: [facility];  Service: Neurosurgery;  Laterality: N/A;  . MALONEY DILATION  03/16/2011   Gastritis. No H.pylori on bx. 47F maloney dilation with  disruption of  occult cevical esophageal web  . NASAL SINUS SURGERY    . right knee replacement  2007  . right leg benign tumor    . SHOULDER SURGERY Left   . TEE WITHOUT CARDIOVERSION N/A 10/04/2017   Procedure: TRANSESOPHAGEAL ECHOCARDIOGRAM (TEE) WITH PROPOFOL;  Surgeon: Herminio Commons, MD;  [facility];  Service: Cardiovascular;  Laterality: N/A;  . TONSILLECTOMY    . unspecified area, hysterectomy  1972   partial   Social History   Occupational History  . Occupation: retired  Tobacco Use  . Smoking status: Former Smoker    Packs/day: 1.00    Years: 64.00    Pack years: 64.00    Types: Cigarettes    Start date: 12/24/1947    Quit date: 11/17/2001    Years since quitting: 16.9  . Smokeless tobacco: Never Used  . Tobacco comment: Quit smoking in 2003  Substance and Sexual Activity  . Alcohol use: No    Alcohol/week: 0.0 standard drinks  . Drug use: No  . Sexual activity: Never

## 2018-11-03 DIAGNOSIS — J449 Chronic obstructive pulmonary disease, unspecified: Secondary | ICD-10-CM | POA: Diagnosis not present

## 2018-11-03 DIAGNOSIS — R197 Diarrhea, unspecified: Secondary | ICD-10-CM | POA: Diagnosis not present

## 2018-11-03 DIAGNOSIS — I10 Essential (primary) hypertension: Secondary | ICD-10-CM | POA: Diagnosis not present

## 2018-11-03 DIAGNOSIS — I480 Paroxysmal atrial fibrillation: Secondary | ICD-10-CM | POA: Diagnosis not present

## 2018-11-03 DIAGNOSIS — M109 Gout, unspecified: Secondary | ICD-10-CM | POA: Diagnosis not present

## 2018-11-03 DIAGNOSIS — R079 Chest pain, unspecified: Secondary | ICD-10-CM | POA: Diagnosis not present

## 2018-11-03 DIAGNOSIS — M545 Low back pain: Secondary | ICD-10-CM | POA: Diagnosis not present

## 2018-11-05 ENCOUNTER — Other Ambulatory Visit: Payer: Self-pay | Admitting: *Deleted

## 2018-11-05 MED ORDER — ISOSORBIDE MONONITRATE ER 30 MG PO TB24: 30.0000 mg | ORAL_TABLET | Freq: Every day | ORAL | 1 refills | Status: DC

## 2018-11-05 MED ORDER — METOPROLOL SUCCINATE ER 25 MG PO TB24: 25.0000 mg | ORAL_TABLET | Freq: Every day | ORAL | 1 refills | Status: DC

## 2018-11-15 DIAGNOSIS — I10 Essential (primary) hypertension: Secondary | ICD-10-CM | POA: Diagnosis not present

## 2018-11-15 DIAGNOSIS — F331 Major depressive disorder, recurrent, moderate: Secondary | ICD-10-CM | POA: Diagnosis not present

## 2018-11-15 DIAGNOSIS — E7800 Pure hypercholesterolemia, unspecified: Secondary | ICD-10-CM | POA: Diagnosis not present

## 2018-11-25 DIAGNOSIS — J449 Chronic obstructive pulmonary disease, unspecified: Secondary | ICD-10-CM | POA: Diagnosis not present

## 2018-11-25 DIAGNOSIS — M545 Low back pain: Secondary | ICD-10-CM | POA: Diagnosis not present

## 2018-11-25 DIAGNOSIS — R739 Hyperglycemia, unspecified: Secondary | ICD-10-CM | POA: Diagnosis not present

## 2018-11-25 DIAGNOSIS — I10 Essential (primary) hypertension: Secondary | ICD-10-CM | POA: Diagnosis not present

## 2018-11-25 DIAGNOSIS — I5033 Acute on chronic diastolic (congestive) heart failure: Secondary | ICD-10-CM | POA: Diagnosis not present

## 2018-11-25 DIAGNOSIS — N183 Chronic kidney disease, stage 3 (moderate): Secondary | ICD-10-CM | POA: Diagnosis not present

## 2018-11-25 DIAGNOSIS — Z6830 Body mass index (BMI) 30.0-30.9, adult: Secondary | ICD-10-CM | POA: Diagnosis not present

## 2018-11-25 DIAGNOSIS — M109 Gout, unspecified: Secondary | ICD-10-CM | POA: Diagnosis not present

## 2018-11-25 DIAGNOSIS — K649 Unspecified hemorrhoids: Secondary | ICD-10-CM | POA: Diagnosis not present

## 2018-11-25 DIAGNOSIS — I4891 Unspecified atrial fibrillation: Secondary | ICD-10-CM | POA: Diagnosis not present

## 2018-11-25 DIAGNOSIS — F419 Anxiety disorder, unspecified: Secondary | ICD-10-CM | POA: Diagnosis not present

## 2018-11-25 DIAGNOSIS — M25512 Pain in left shoulder: Secondary | ICD-10-CM | POA: Diagnosis not present

## 2018-11-25 DIAGNOSIS — Z0001 Encounter for general adult medical examination with abnormal findings: Secondary | ICD-10-CM | POA: Diagnosis not present

## 2018-11-25 DIAGNOSIS — F329 Major depressive disorder, single episode, unspecified: Secondary | ICD-10-CM | POA: Diagnosis not present

## 2018-11-25 DIAGNOSIS — I482 Chronic atrial fibrillation, unspecified: Secondary | ICD-10-CM | POA: Diagnosis not present

## 2018-12-16 DIAGNOSIS — I10 Essential (primary) hypertension: Secondary | ICD-10-CM | POA: Diagnosis not present

## 2018-12-16 DIAGNOSIS — J449 Chronic obstructive pulmonary disease, unspecified: Secondary | ICD-10-CM | POA: Diagnosis not present

## 2019-01-15 DIAGNOSIS — I10 Essential (primary) hypertension: Secondary | ICD-10-CM | POA: Diagnosis not present

## 2019-01-15 DIAGNOSIS — E782 Mixed hyperlipidemia: Secondary | ICD-10-CM | POA: Diagnosis not present

## 2019-01-30 DIAGNOSIS — F329 Major depressive disorder, single episode, unspecified: Secondary | ICD-10-CM | POA: Diagnosis not present

## 2019-01-30 DIAGNOSIS — I5033 Acute on chronic diastolic (congestive) heart failure: Secondary | ICD-10-CM | POA: Diagnosis not present

## 2019-01-30 DIAGNOSIS — N189 Chronic kidney disease, unspecified: Secondary | ICD-10-CM | POA: Diagnosis not present

## 2019-01-30 DIAGNOSIS — I10 Essential (primary) hypertension: Secondary | ICD-10-CM | POA: Diagnosis not present

## 2019-01-30 DIAGNOSIS — Z6830 Body mass index (BMI) 30.0-30.9, adult: Secondary | ICD-10-CM | POA: Diagnosis not present

## 2019-01-30 DIAGNOSIS — Z23 Encounter for immunization: Secondary | ICD-10-CM | POA: Diagnosis not present

## 2019-01-30 DIAGNOSIS — I4891 Unspecified atrial fibrillation: Secondary | ICD-10-CM | POA: Diagnosis not present

## 2019-01-30 DIAGNOSIS — J449 Chronic obstructive pulmonary disease, unspecified: Secondary | ICD-10-CM | POA: Diagnosis not present

## 2019-01-31 ENCOUNTER — Other Ambulatory Visit: Payer: Self-pay | Admitting: Cardiovascular Disease

## 2019-02-14 DIAGNOSIS — I482 Chronic atrial fibrillation, unspecified: Secondary | ICD-10-CM | POA: Diagnosis not present

## 2019-02-14 DIAGNOSIS — I10 Essential (primary) hypertension: Secondary | ICD-10-CM | POA: Diagnosis not present

## 2019-02-20 ENCOUNTER — Telehealth: Payer: Self-pay | Admitting: Cardiovascular Disease

## 2019-02-20 NOTE — Telephone Encounter (Signed)
    This was routed to myself instead of Dr. Bronson Ing. Agree with the ED evaluation if she has recurrent symptoms. She is on Imdur given her history of CAD (cath earlier this year showing no significant change when compared to prior studies) and she could try titrating Imdur to 60mg  daily given stable BP. Headaches can worsen with Imdur and if this happens, would encourage her to utilize Tylenol.   Signed, Erma Heritage, PA-C 02/20/2019, 5:06 PM Pager: [phone]

## 2019-02-20 NOTE — Telephone Encounter (Signed)
Pt called stating she had a very sharp, crushing type feeling in her chest, it was hard for her to breath so she laid down and tried to relax.   Stated she's very short winded and that her heart is just racing when she gets up to go do anything.

## 2019-02-20 NOTE — Telephone Encounter (Addendum)
Returned pt call. She complains that earlier today she had a "spell of chest heaviness." She does sound short winded when talking. She stated that when she is up moving around her entire body aches. She has pain in all of her limbs and stated her pcp keeps treating her for gout. The pt is sure it is neuropathy, but that does not have a diagnosis. She did state that she had to take a nitro yesterday because she was having chest pain on and off. The nitro did help. Pt also complained that she has a terrible headache today and it was relieved by resting. Her blood pressure today is 136/51. O2 is 92 at rest, weight is 156 lbs. She is currently not having any swelling that she can tell. She went on to discuss that "November is a bad month for her, as she lost many loved ones in this month." I advised her that if she is having active chest pain, pressure and SOB she would best benefit from an ED evaluation. She stated that they do not help her, and her insurance will not cover it. She did state that if her symptoms got worse she will go. She has an appointment with [facility] on 11/23 and she will keep that. I will forward to Dr. Bronson Ing as an Jul[facility]ier.

## 2019-02-21 MED ORDER — ISOSORBIDE MONONITRATE ER 60 MG PO TB24: 60.0000 mg | ORAL_TABLET | Freq: Every day | ORAL | 3 refills | Status: DC

## 2019-02-21 NOTE — Telephone Encounter (Signed)
Returned pt call. She is feeling somewhat better today. Hee blood pressure is still about the same. She will try to increase her imdur to 60 mg daily and see if that helps her occasional cp. Sent in new rx to [facility].

## 2019-02-25 DIAGNOSIS — R300 Dysuria: Secondary | ICD-10-CM | POA: Diagnosis not present

## 2019-03-05 DIAGNOSIS — N183 Chronic kidney disease, stage 3 unspecified: Secondary | ICD-10-CM | POA: Diagnosis not present

## 2019-03-05 DIAGNOSIS — M109 Gout, unspecified: Secondary | ICD-10-CM | POA: Diagnosis not present

## 2019-03-05 DIAGNOSIS — I4891 Unspecified atrial fibrillation: Secondary | ICD-10-CM | POA: Diagnosis not present

## 2019-03-05 DIAGNOSIS — I10 Essential (primary) hypertension: Secondary | ICD-10-CM | POA: Diagnosis not present

## 2019-03-05 DIAGNOSIS — Z6830 Body mass index (BMI) 30.0-30.9, adult: Secondary | ICD-10-CM | POA: Diagnosis not present

## 2019-03-05 DIAGNOSIS — I5033 Acute on chronic diastolic (congestive) heart failure: Secondary | ICD-10-CM | POA: Diagnosis not present

## 2019-03-05 DIAGNOSIS — J449 Chronic obstructive pulmonary disease, unspecified: Secondary | ICD-10-CM | POA: Diagnosis not present

## 2019-03-05 DIAGNOSIS — K649 Unspecified hemorrhoids: Secondary | ICD-10-CM | POA: Diagnosis not present

## 2019-03-05 NOTE — Progress Notes (Signed)
Cardiology Office Note    Date:  03/10/2019   ID:  [name], DOB 1932/04/20, MRN [id number]  PCP:  Rosalee Kaufman, PA-C  Cardiologist: Kate Sable, MD EPS: Cristopher Peru, MD  No chief complaint on file.   History of Present Illness:  [name] is a 85 y.o. female with history of CAD status post stent to the circumflex in 2015, repeat cardiac cath 05/16/2018 90% small RCA, 50% circumflex after 20% narrowing of her DES and 30% LAD.  Plan was for medical therapy., PAF on Eliquis, CKD amiodarone stopped by PCP in 07/2018  Last seen in the hospital 09/2018 with AKI felt secondary to dehydration.  She did have new diffuse ST depression in aVR elevation but troponins were negative.  Primary complaint was abdominal pain and diarrhea.  Dr. Harl Bowie felt she could have an ischemic work-up as an outpatient once GI  issues resolved.  2D echo showed normal LVEF 60 to 65%, impaired relaxation.  Patient called in 02/20/2019 with chest pain and told to increase her Imdur.  Patient complains of sharp shooting pain under her breast that takes her breath away-comes and goes. Also has fluttering in her heart. Says she is short of breath with little activity. Has chest tightness when she lays down at night.Took extra lasix today for 3 lb weight gain.  She thinks she's having a lot of Afib. Also complains of numbness in her feet and purple toes. Says increase in Imdur hasn't helped at all. She's very focused on her back and leg pain. No claudication symptoms and has good pulses. Has a lot of stress and anxiety.  Past Medical History:  Diagnosis Date  . Anxiety   . Arthritis   . Atrial fibrillation ([place])   . Bursitis    Left shoulder  . Cataract   . CHF (congestive heart failure) ([place])   . COPD (chronic obstructive pulmonary disease) ([place])   . Coronary atherosclerosis of native coronary artery    a. s/p DES to LCx in 04/2013 b. cath in 11/2015 showing patent stent with 20% prox-LAD and  80% ostial RCA stenosis for which medical management was recommended due to small artery size  . Depression   . Diastolic heart failure (HCC)    EF 55-60%  . Dysphagia, unspecified(787.20)   . Dyspnea   . Dysrhythmia   . Essential hypertension   . GERD (gastroesophageal reflux disease)    Hx Schatzki's ring, multiple EGD/ED last 01/06/2004  . Gout   . Headache   . History of anemia   . Hyperlipidemia   . Internal hemorrhoids without mention of complication   . MI (myocardial infarction) ([place]) 2006  . Microscopic colitis 2003  . Panic disorder without agoraphobia   . Paresthesia   . Pneumonia 12/2011  . PVD (peripheral vascular disease) ([place])   . S/P colonoscopy 09/27/2001   internal hemorrhoids, desc colon inflam polyp, SB BX-chronic duodenitis, colitis  . Thyroid disease     Past Surgical History:  Procedure Laterality Date  . ABDOMINAL HYSTERECTOMY    . ABDOMINAL HYSTERECTOMY    . ANTERIOR AND POSTERIOR REPAIR     with resection of vagina  . ANTERIOR LAT LUMBAR FUSION N/A 08/01/2016   Procedure: Lumbar Two-Lumbar Five Transpsoas lateral interbody fusion with Lumbar Two-Three lateral plate fixation;  Surgeon: Kevan Ny Ditty, MD;  Location: [facility];  Service: Neurosurgery;  Laterality: N/A;  L2-5 Transpsoas lateral interbody fusion with L2-3 lateral plate fixation  . APPENDECTOMY    .  BACK SURGERY    . BIOPSY  07/05/2015   Procedure: BIOPSY;  Surgeon: Daneil Dolin, MD;  [facility];  Service: Endoscopy;;  gastric polyp biopsy, ascending colon biopsy  . BLADDER SUSPENSION  11/09/2011   Procedure: TRANSVAGINAL TAPE (TVT) PROCEDURE;  Surgeon: Marissa Nestle, MD;  Location: [facility];  Service: Urology;  Laterality: N/A;  . bladder tack  06/2010  . BREAST LUMPECTOMY  1998   left, benign  . CARDIAC CATHETERIZATION    . CARDIAC CATHETERIZATION    . CARDIAC CATHETERIZATION N/A 12/16/2015   Procedure: Left Heart Cath and Coronary Angiography;  Surgeon: Troy Sine,  MD;  Location: [facility];  Service: Cardiovascular;  Laterality: N/A;  . CARDIOVERSION N/A 10/04/2017   Procedure: CARDIOVERSION;  Surgeon: Herminio Commons, MD;  Location: [facility];  Service: Cardiovascular;  Laterality: N/A;  . CARDIOVERSION N/A 01/30/2018   Procedure: CARDIOVERSION;  Surgeon: Herminio Commons, MD;  [facility];  Service: Cardiovascular;  Laterality: N/A;  . [place]   left  . cataract surgery    . CHOLECYSTECTOMY  1998  . Cholecystectomy    . COLONOSCOPY  03/16/2011   multiple hyperplastic colon polyps, sigmoid diverticulosis, melanosis coli  . COLONOSCOPY WITH PROPOFOL N/A 07/05/2015   RMR:one 5 mm polyp in descending colon  . CORONARY ANGIOGRAPHY N/A 05/16/2018   Procedure: CORONARY ANGIOGRAPHY (CATH LAB);  Surgeon: Belva Crome, MD;  Location: [facility];  Service: Cardiovascular;  Laterality: N/A;  . CORONARY ANGIOPLASTY WITH STENT PLACEMENT    . ESOPHAGEAL DILATION N/A 07/05/2015   Procedure: ESOPHAGEAL DILATION;  Surgeon: Daneil Dolin, MD;  [facility];  Service: Endoscopy;  Laterality: N/A;  . ESOPHAGOGASTRODUODENOSCOPY (EGD) WITH PROPOFOL N/A 07/05/2015   OM[facility]  . JOINT REPLACEMENT Right 2007   right knee  . left hand surgery    . LEFT HEART CATHETERIZATION WITH CORONARY ANGIOGRAM N/A 05/14/2013   Procedure: LEFT HEART CATHETERIZATION WITH CORONARY ANGIOGRAM;  Surgeon: Blane Ohara, MD;  Location: [facility] CATH LAB;  Service: Cardiovascular;  Laterality: N/A;  . left rotator cuff surgery    . LUMBAR LAMINECTOMY/DECOMPRESSION MICRODISCECTOMY N/A 10/11/2012   Procedure: LUMBAR LAMINECTOMY/DECOMPRESSION MICRODISCECTOMY 2 LEVELS;  Surgeon: Floyce Stakes, MD;  Location: [place] [facility];  Service: Neurosurgery;  Laterality: N/A;  L3-4 L4-5 Laminectomy  . LUMBAR WOUND DEBRIDEMENT N/A 09/27/2015   Procedure: Exploration of Lumbar Wound w/ Repair CSF Leak/Lumbar Drain Placement;  Surgeon: Leeroy Cha,  MD;  Location: [facility] [facility]UR[facility] OR[facility];  Service: Neurosurgery;  Laterality: N/A;  . MALONEY DILATION  03/16/2011   Gastritis. No H.pylori on bx. 19F maloney dilation with disruption of  occult cevical esophageal web  . NASAL SINUS SURGERY    . right knee replacement  2007  . right leg benign tumor    . SHOULDER SURGERY Left   . TEE WITHOUT CARDIOVERSION N/A 10/04/2017   Procedure: TRANSESOPHAGEAL ECHOCARDIOGRAM (TEE) WITH PROPOFOL;  Surgeon: Herminio Commons, MD;  [facility];  Service: Cardiovascular;  Laterality: N/A;  . TONSILLECTOMY    . unspecified area, hysterectomy  1972   partial    Current Medications: Current Meds  Medication Sig  . acetaminophen (TYLENOL) 500 MG tablet Take 500 mg by mouth every 6 (six) hours as needed for headache.   . ALPRAZolam (XANAX) 0.25 MG tablet Take 0.25 mg by mouth 2 (two) times daily as needed for anxiety.   [name] Kitchen apixaban (ELIQUIS)  5 MG TABS tablet Take 1 tablet (5 mg total) by mouth 2 (two) times daily. Resume with AM dose on 05/17/2018  . colchicine 0.6 MG tablet Take 1 tablet (0.6 mg total) by mouth daily.  . DULoxetine (CYMBALTA) 60 MG capsule Take 60 mg by mouth at bedtime.   . fluticasone (FLONASE) 50 MCG/ACT nasal spray Place 2 sprays into both nostrils 2 (two) times daily as needed for allergies.   . furosemide (LASIX) 40 MG tablet Take 0.5 tablets (20 mg total) by mouth daily.  [name] Kitchen HYDROcodone-acetaminophen (NORCO/VICODIN) 5-325 MG tablet Take 1 tablet by mouth every 6 (six) hours as needed.  [name] Kitchen ipratropium-albuterol (DUONEB) 0.5-2.5 (3) MG/3ML SOLN Take 3 mLs by nebulization every 6 (six) hours.  . isosorbide mononitrate (IMDUR) 60 MG 24 hr tablet Take 1 tablet (60 mg total) by mouth daily.  . Lactobacillus (ACIDOPHILUS PROBIOTIC) 0.5 MG TABS TAKE ONE TABLET BY MOUTH DAILY FOR 5 DAYS  . levothyroxine (SYNTHROID, LEVOTHROID) 25 MCG tablet Take 1 tablet by mouth daily.  . magnesium oxide (MAG-OX) 400 MG tablet TAKE ONE TABLET BY MOUTH DAILY. (Patient  taking differently: Take 400 mg by mouth daily. )  . methocarbamol (ROBAXIN) 500 MG tablet Take 500 mg by mouth every 6 (six) hours as needed for muscle spasms.   . metoprolol succinate (TOPROL-XL) 50 MG 24 hr tablet Take 1 tablet (50 mg total) by mouth daily.  . Multiple Vitamin (MULTIVITAMIN WITH MINERALS) TABS tablet Take 1 tablet by mouth daily. Centrum   . nitroGLYCERIN (NITROSTAT) 0.4 MG SL tablet Place 1 tablet (0.4 mg total) under the tongue every 5 (five) minutes as needed for chest pain. Reported on 08/04/2015  . pantoprazole (PROTONIX) 40 MG tablet Take 40 mg by mouth daily.   . potassium chloride (K-DUR) 10 MEQ tablet Take 1 tablet (10 mEq total) by mouth daily.  . sodium chloride (OCEAN) 0.65 % SOLN nasal spray Place 1 spray into both nostrils 2 (two) times daily.  . [DISCONTINUED] metoprolol succinate (TOPROL-XL) 25 MG 24 hr tablet TAKE ONE TABLET BY MOUTH EVERY DAY     Allergies:   Cephalosporins, Levaquin [levofloxacin in d5w], Macrodantin [nitrofurantoin macrocrystal], Phenothiazines, Polysorbate, Prednisone, Buspirone, Cardura [doxazosin mesylate], Codeine, Acyclovir and related, Colcrys [colchicine], Prochlorperazine, Ranexa [ranolazine], Atorvastatin, Ofloxacin, Other, Penicillins, and Pimozide   Social History   Socioeconomic History  . Marital status: Divorced    Spouse name: Not on file  . Number of children: 5  . Years of education: Not on file  . Highest education level: Not on file  Occupational History  . Occupation: retired  Scientific laboratory technician  . Financial resource strain: Not on file  . Food insecurity    Worry: Not on file    Inability: Not on file  . Transportation needs    Medical: Not on file    Non-medical: Not on file  Tobacco Use  . Smoking status: Former Smoker    Packs/day: 1.00    Years: 64.00    Pack years: 64.00    Types: Cigarettes    Start date: 12/24/1947    Quit date: 11/17/2001    Years since quitting: 17.3  . Smokeless tobacco: Never Used   . Tobacco comment: Quit smoking in 2003  Substance and Sexual Activity  . Alcohol use: No    Alcohol/week: 0.0 standard drinks  . Drug use: No  . Sexual activity: Never  Lifestyle  . Physical activity    Days per week: Not on file  Minutes per session: Not on file  . Stress: Not on file  Relationships  . Social Herbalist on phone: Not on file    Gets together: Not on file    Attends religious service: Not on file    Active member of club or organization: Not on file    Attends meetings of clubs or organizations: Not on file    Relationship status: Not on file  Other Topics Concern  . Not on file  Social History Narrative   Divorced.   Sister had colon perforation & died from complications in [place], [place]     Family History:  The patient's family history includes Cancer in an other family member; Coronary artery disease in an other family member; Diabetes in her brother and son; Heart disease in her son; Hypertension in an other family member; Parkinson's disease in her father; Stroke in her mother and another family member; Stroke (age of onset: 68) in her daughter.   ROS:   Please see the history of present illness.    ROS All other systems reviewed and are negative.   PHYSICAL EXAM:   VS:  BP (!) 159/71   Pulse 68   Temp (!) 96.8 F (36 C)   Ht 5\' 4"  (1.626 m)   Wt 165 lb (74.8 kg)   SpO2 96%   BMI 28.32 kg/m   Physical Exam  GEN: Well nourished, well developed, in no acute distress  Neck: no JVD, carotid bruits, or masses Cardiac:RRR; no murmurs, rubs, or gallops  Respiratory:  clear to auscultation bilaterally, normal work of breathing GI: soft, nontender, nondistended, + BS Ext: without cyanosis, clubbing, or edema, Good distal pulses bilaterally Neuro:  Alert and Oriented x 3, Psych: euthymic mood, full affect  Wt Readings from Last 3 Encounters:  03/10/19 165 lb (74.8 kg)  10/31/18 162 lb (73.5 kg)  10/01/18 158 lb 4.6 oz (71.8 kg)       Studies/Labs Reviewed:   EKG:  EKG is reviewed from 10/01/2018 and 10/02/2018 with diffuse inferior lateral ST depression and slight elevation in aVR  Recent Labs: 10/01/2018: B Natriuretic Peptide 54.0 10/03/2018: ALT 34 10/04/2018: BUN 16; Creatinine, Ser 1.17; Magnesium 1.8; Potassium 3.7; Sodium 138 03/10/2019: Hemoglobin 12.3; Platelets 263   Lipid Panel    Component Value Date/Time   CHOL 177 12/16/2015 0435   TRIG 197 (H) 12/16/2015 0435   HDL 35 (L) 12/16/2015 0435   CHOLHDL 5.1 12/16/2015 0435   VLDL 39 12/16/2015 0435   LDLCALC 103 (H) 12/16/2015 0435    Additional studies/ records that were reviewed today include:  2D echo 6/2020IMPRESSIONS      1. The left ventricle has normal systolic function with an ejection fraction of 60-65%. The cavity size was normal. There is mildly increased left ventricular wall thickness. Left ventricular diastolic Doppler parameters are consistent with impaired  relaxation. Elevated mean left atrial pressure.  2. The right ventricle has normal systolic function. The cavity was normal. There is no increase in right ventricular wall thickness.  3. Left atrial size was mildly dilated.  4. The aortic valve is tricuspid. Mild thickening of the aortic valve. Mild calcification of the aortic valve. Aortic valve regurgitation is mild by color flow Doppler. No stenosis of the aortic valve. Mild aortic annular calcification noted.  5. The mitral valve is abnormal. Mild thickening of the mitral valve leaflet. Mild calcification of the mitral valve leaflet. There is mild mitral annular calcification present. No  evidence of mitral valve stenosis.  6. The aortic root is normal in size and structure.  7. Pulmonary hypertension is indeterminant, inadequate TR jet.   FINDINGS  Left Ventricle: The left ventricle has normal systolic function, with an ejection fraction of 60-65%. The cavity size was normal. There is mildly increased left ventricular wall thickness.  Left ventricular diastolic Doppler parameters are consistent  with impaired relaxation. Elevated mean left atrial pressure   Right Ventricle: The right ventricle has normal systolic function. The cavity was normal. There is no increase in right ventricular wall thickness.   Left Atrium: Left atrial size was mildly dilated.   Right Atrium: Right atrial size was normal in size. Right atrial pressure is estimated at 3 mmHg.   Interatrial Septum: No atrial level shunt detected by color flow Doppler.   Pericardium: There is no evidence of pericardial effusion.   Mitral Valve: The mitral valve is abnormal. Mild thickening of the mitral valve leaflet. Mild calcification of the mitral valve leaflet. There is mild mitral annular calcification present. Mitral valve regurgitation is not visualized by color flow  Doppler. No evidence of mitral valve stenosis.   Tricuspid Valve: The tricuspid valve is normal in structure. Tricuspid valve regurgitation was not visualized by color flow Doppler.   Aortic Valve: The aortic valve is tricuspid Mild thickening of the aortic valve. Mild calcification of the aortic valve. Aortic valve regurgitation is mild by color flow Doppler. There is No stenosis of the aortic valve. Mild aortic annular calcification  noted.   Pulmonic Valve: The pulmonic valve was not well visualized. Pulmonic valve regurgitation is not visualized by color flow Doppler. No evidence of pulmonic stenosis.   Aorta: The aortic root is normal in size and structure.   Pulmonary Artery: Pulmonary hypertension is indeterminant, inadequate TR jet.   Venous: The inferior vena cava is normal in size with greater than 50% respiratory variability.    Cardiac catheterization 1/2020Patent short left main  20 to 30% proximal LAD irregularity, with diffuse 20% narrowing within the mid vessel.  Patent proximal circumflex stent with mid to distal circumflex irregularities up to 30 to 40%.   Nondominant right coronary with ostial 90% narrowing.  No hemodynamics recorded.  Left ventriculography was not performed in an effort to decrease contrast exposure in the setting of CKD.   RECOMMENDATIONS:    Compared to prior angiography in 2017, no significant changes noted.  Chest pain is atypical and may be more musculoskeletal or pleuritic.  Resume Eliquis in 12 hours/a.m. of 05/17/2018     ASSESSMENT:    1. Chronic diastolic heart failure (HCC)   2. Chest pain of uncertain etiology   3. Coronary artery disease involving native coronary artery of native heart without angina pectoris   4. PAF (paroxysmal atrial fibrillation) (HCC)   5. Stage 3 chronic kidney disease, unspecified whether stage 3a or 3b CKD      PLAN:  In order of problems listed above:  Chest pain-atypical-sharp shooting and associated palpitations and fluid overload.  Imdur increase has not helped.  History of CAD status post stenting of the circumflex in 2015.  Last cath 04/2018 90% ostial nondominant RCA, patent proximal circumflex stent with mid to distal circumflex irregularities 30 to 40%, 20 to 30% proximal LAD.  Had EKG changes 09/2018 when admitted with AKI.  Treated medically at that time.    Chronic diastolic CHF patient had 2 or 3 pound weight gain and is taken double Lasix for 3 days  as instructed.  No significant heart failure on exam but she was having shortness of breath with laying down.  No changes.  Will check labs including BNP.  PAF on Eliquis.  Had maintained normal sinus rhythm.  Amiodarone stopped by PCP in April. Having a lot of palpitations, racing, fluttering. Increase toprol 50 mg daily and holter monitor, f/u Dr. Lovena Le.  I think a lot of her chest pain and shortness of breath are coming from recurrent atrial fib.   Essential hypertension blood pressures been running high.  Increase Toprol to 50 mg daily.  Follow-up with Dr. Bronson Ing in 2 months.  CKD Crt 1.17 10/04/18    Medication Adjustments/Labs and Tests Ordered: Current medicines are reviewed at length with the patient today.  Concerns regarding medicines are outlined above.  Medication changes, Labs and Tests ordered today are listed in the Patient Instructions below. Patient Instructions  Medication Instructions:  INCREASE Toprol to 50 mg daily   *If you need a refill on your cardiac medications before your next appointment, please call your pharmacy*  Lab Work: BMET,CBC,BNP today If you have labs (blood work) drawn today and your tests are completely normal, you will receive your results only by: [name] Kitchen MyChart Message (if you have MyChart) OR . A paper copy in the mail If you have any lab test that is abnormal or we need to change your treatment, we will call you to review the results.  Testing/Procedures: Your physician has recommended that you wear an event monitor for 30 days . Event monitors are medical devices that record the heart's electrical activity. Doctors most often [place] these monitors to diagnose arrhythmias. Arrhythmias are problems with the speed or rhythm of the heartbeat. The monitor is a small, portable device. You can wear one while you do your normal daily activities. This is usually used to diagnose what is causing palpitations/syncope (passing out).    Follow-Up: At [facility], you and your health needs are our priority.  As part of our continuing mission to provide you with exceptional heart care, we have created designated Provider Care Teams.  These Care Teams include your primary Cardiologist (physician) and Advanced Practice Providers (APPs -  Physician Assistants and Nurse Practitioners) who all work together to provide you with the care you need, when you need it.  Your next appointment:   6 week(s)  The format for your next appointment:   In Person  Provider:   Cristopher Peru, MD  Other Instructions Follow up in 8 weeks with Dr.Koneswaran     Signed, Ermalinda Barrios, PA-C  03/10/2019 2:34 PM    [address], [place], [place]  [address] Phone: [phone]; Fax: [phone]

## 2019-03-10 ENCOUNTER — Other Ambulatory Visit (HOSPITAL_COMMUNITY)
Admission: RE | Admit: 2019-03-10 | Discharge: 2019-03-10 | Disposition: A | Discharge status: Home / Self Care | Payer: Medicare Other | Source: Ambulatory Visit | Attending: Physician Assistant | Admitting: Physician Assistant

## 2019-03-10 ENCOUNTER — Encounter: Payer: Self-pay | Admitting: Physician Assistant

## 2019-03-10 ENCOUNTER — Other Ambulatory Visit: Payer: Self-pay

## 2019-03-10 ENCOUNTER — Ambulatory Visit (INDEPENDENT_AMBULATORY_CARE_PROVIDER_SITE_OTHER): Payer: Medicare Other | Admitting: Physician Assistant

## 2019-03-10 VITALS — BP 159/71 | HR 68 | Temp 96.8°F | Ht 64.0 in | Wt 165.0 lb

## 2019-03-10 DIAGNOSIS — I480 Paroxysmal atrial fibrillation: Secondary | ICD-10-CM | POA: Diagnosis not present

## 2019-03-10 DIAGNOSIS — R079 Chest pain, unspecified: Secondary | ICD-10-CM | POA: Diagnosis not present

## 2019-03-10 DIAGNOSIS — N183 Chronic kidney disease, stage 3 unspecified: Secondary | ICD-10-CM | POA: Diagnosis not present

## 2019-03-10 DIAGNOSIS — I5032 Chronic diastolic (congestive) heart failure: Secondary | ICD-10-CM | POA: Diagnosis not present

## 2019-03-10 DIAGNOSIS — I2510 Atherosclerotic heart disease of native coronary artery without angina pectoris: Secondary | ICD-10-CM | POA: Diagnosis not present

## 2019-03-10 LAB — BASIC METABOLIC PANEL
Anion gap: 9 (ref 5–15)
BUN: 16 mg/dL (ref 8–23)
CO2: 24 mmol/L (ref 22–32)
Calcium: 8.9 mg/dL (ref 8.9–10.3)
Chloride: 104 mmol/L (ref 98–111)
Creatinine, Ser: 1.25 mg/dL — ABNORMAL HIGH (ref 0.44–1.00)
GFR calc Af Amer: 46 mL/min — ABNORMAL LOW (ref >60)
GFR calc non Af Amer: 39 mL/min — ABNORMAL LOW (ref >60)
Glucose, Bld: 109 mg/dL — ABNORMAL HIGH (ref 70–99)
Potassium: 4.2 mmol/L (ref 3.5–5.1)
Sodium: 137 mmol/L (ref 135–145)

## 2019-03-10 LAB — CBC
HCT: 38 % (ref 36.0–46.0)
Hemoglobin: 12.3 g/dL (ref 12.0–15.0)
MCH: 29.4 pg (ref 26.0–34.0)
MCHC: 32.4 g/dL (ref 30.0–36.0)
MCV: 90.9 fL (ref 80.0–100.0)
Platelets: 263 10*3/uL (ref 150–400)
RBC: 4.18 MIL/uL (ref 3.87–5.11)
RDW: 14.5 % (ref 11.5–15.5)
WBC: 6.1 10*3/uL (ref 4.0–10.5)
nRBC: 0 % (ref 0.0–0.2)

## 2019-03-10 LAB — BRAIN NATRIURETIC PEPTIDE: B Natriuretic Peptide: 239 pg/mL — ABNORMAL HIGH (ref 0.0–100.0)

## 2019-03-10 MED ORDER — METOPROLOL SUCCINATE ER 50 MG PO TB24: 50.0000 mg | ORAL_TABLET | Freq: Every day | ORAL | 3 refills | Status: DC

## 2019-03-10 NOTE — Patient Instructions (Signed)
Medication Instructions:  INCREASE Toprol to 50 mg daily   *If you need a refill on your cardiac medications before your next appointment, please call your pharmacy*  Lab Work: BMET,CBC,BNP today If you have labs (blood work) drawn today and your tests are completely normal, you will receive your results only by: [name] Kitchen MyChart Message (if you have MyChart) OR . A paper copy in the mail If you have any lab test that is abnormal or we need to change your treatment, we will call you to review the results.  Testing/Procedures: Your physician has recommended that you wear an event monitor for 30 days . Event monitors are medical devices that record the heart's electrical activity. Doctors most often [place] these monitors to diagnose arrhythmias. Arrhythmias are problems with the speed or rhythm of the heartbeat. The monitor is a small, portable device. You can wear one while you do your normal daily activities. This is usually used to diagnose what is causing palpitations/syncope (passing out).    Follow-Up: At [facility], you and your health needs are our priority.  As part of our continuing mission to provide you with exceptional heart care, we have created designated Provider Care Teams.  These Care Teams include your primary Cardiologist (physician) and Advanced Practice Providers (APPs -  Physician Assistants and Nurse Practitioners) who all work together to provide you with the care you need, when you need it.  Your next appointment:   6 week(s)  The format for your next appointment:   In Person  Provider:   Cristopher Peru, MD  Other Instructions Follow up in 8 weeks with Dr.Koneswaran

## 2019-03-17 DIAGNOSIS — J449 Chronic obstructive pulmonary disease, unspecified: Secondary | ICD-10-CM | POA: Diagnosis not present

## 2019-03-17 DIAGNOSIS — M109 Gout, unspecified: Secondary | ICD-10-CM | POA: Diagnosis not present

## 2019-03-18 ENCOUNTER — Telehealth: Payer: Self-pay | Admitting: Physician Assistant

## 2019-03-18 ENCOUNTER — Ambulatory Visit (INDEPENDENT_AMBULATORY_CARE_PROVIDER_SITE_OTHER): Payer: Medicare Other

## 2019-03-18 DIAGNOSIS — I480 Paroxysmal atrial fibrillation: Secondary | ICD-10-CM

## 2019-03-18 NOTE — Telephone Encounter (Signed)
Pt needs help putting her monitor on

## 2019-03-18 NOTE — Telephone Encounter (Signed)
Coming to office at 4 pm for help

## 2019-04-22 ENCOUNTER — Other Ambulatory Visit: Payer: Self-pay

## 2019-04-22 ENCOUNTER — Encounter: Payer: Self-pay | Admitting: Internal Medicine

## 2019-04-22 ENCOUNTER — Ambulatory Visit (INDEPENDENT_AMBULATORY_CARE_PROVIDER_SITE_OTHER): Payer: Medicare HMO | Admitting: Internal Medicine

## 2019-04-22 VITALS — BP 148/68 | HR 91 | Ht 63.0 in | Wt 164.0 lb

## 2019-04-22 DIAGNOSIS — I480 Paroxysmal atrial fibrillation: Secondary | ICD-10-CM | POA: Diagnosis not present

## 2019-04-22 DIAGNOSIS — I5032 Chronic diastolic (congestive) heart failure: Secondary | ICD-10-CM | POA: Diagnosis not present

## 2019-04-22 DIAGNOSIS — I2510 Atherosclerotic heart disease of native coronary artery without angina pectoris: Secondary | ICD-10-CM | POA: Diagnosis not present

## 2019-04-22 NOTE — Progress Notes (Signed)
HPI [name] returns today for followup and for preoperative evaluation. She is an 86 yo woman with CAD, preserved LV function, rate controlled atrial fib after stopping amiodarone, COPD, and vascular disease. She has had problems with her back and is pending back surgery. She denies anginal symptoms. She has chronic dyspnea which is class 2. She has pain in her feet.  Allergies  Allergen Reactions  . Cephalosporins Diarrhea and Nausea Only    Lightheaded  . Levaquin [Levofloxacin In D5w] Swelling  . Macrodantin [Nitrofurantoin Macrocrystal] Swelling  . Phenothiazines Anaphylaxis and Hives  . Polysorbate Anaphylaxis  . Prednisone Shortness Of Breath  . Buspirone Itching  . Cardura [Doxazosin Mesylate] Itching  . Codeine Itching  . Acyclovir And Related Itching    Redness of skin  . Colcrys [Colchicine] Nausea Only  . Prochlorperazine Other (See Comments)    "Upset stomach"  . Ranexa [Ranolazine]     Severe drop in BP  . Atorvastatin Hives    Cramping; tolerates Crestor ok  . Ofloxacin Rash  . Other Itching and Rash    "WOOL"= make skin look like it has been burned  . Penicillins Other (See Comments)    Causes redness all over. Has patient had a PCN reaction causing immediate rash, facial/tongue/throat swelling, SOB or lightheadedness with hypotension: No Has patient had a PCN reaction causing severe rash involving mucus membranes or skin necrosis: No Has patient had a PCN reaction that required hospitalization No Has patient had a PCN reaction occurring within the last 10 years: No If all of the above answers are "NO", then may proceed with Cephalosporin use.   . Pimozide Hives and Itching     Current Outpatient Medications  Medication Sig Dispense Refill  . acetaminophen (TYLENOL) 500 MG tablet Take 500 mg by mouth every 6 (six) hours as needed for headache.     . ALPRAZolam (XANAX) 0.25 MG tablet Take 0.25 mg by mouth 2 (two) times daily as needed for anxiety.    0  . apixaban (ELIQUIS) 5 MG TABS tablet Take 1 tablet (5 mg total) by mouth 2 (two) times daily. Resume with AM dose on 05/17/2018 180 tablet 3  . colchicine 0.6 MG tablet Take 1 tablet (0.6 mg total) by mouth daily. 40 tablet 0  . DULoxetine (CYMBALTA) 60 MG capsule Take 60 mg by mouth at bedtime.     . fluticasone (FLONASE) 50 MCG/ACT nasal spray Place 2 sprays into both nostrils 2 (two) times daily as needed for allergies.     . furosemide (LASIX) 40 MG tablet Take 0.5 tablets (20 mg total) by mouth daily. 90 tablet 0  . HYDROcodone-acetaminophen (NORCO/VICODIN) 5-325 MG tablet Take 1 tablet by mouth every 6 (six) hours as needed. 20 tablet 0  . ipratropium-albuterol (DUONEB) 0.5-2.5 (3) MG/3ML SOLN Take 3 mLs by nebulization every 6 (six) hours. 360 mL 0  . isosorbide mononitrate (IMDUR) 60 MG 24 hr tablet Take 1 tablet (60 mg total) by mouth daily. 90 tablet 3  . Lactobacillus (ACIDOPHILUS PROBIOTIC) 0.5 MG TABS TAKE ONE TABLET BY MOUTH DAILY FOR 5 DAYS    . levothyroxine (SYNTHROID, LEVOTHROID) 25 MCG tablet Take 1 tablet by mouth daily.    . magnesium oxide (MAG-OX) 400 MG tablet TAKE ONE TABLET BY MOUTH DAILY. (Patient taking differently: Take 400 mg by mouth daily. ) 90 tablet 3  . methocarbamol (ROBAXIN) 500 MG tablet Take 500 mg by mouth every 6 (six) hours as needed  for muscle spasms.     . metoprolol succinate (TOPROL-XL) 50 MG 24 hr tablet Take 1 tablet (50 mg total) by mouth daily. 90 tablet 3  . Multiple Vitamin (MULTIVITAMIN WITH MINERALS) TABS tablet Take 1 tablet by mouth daily. Centrum     . nitroGLYCERIN (NITROSTAT) 0.4 MG SL tablet Place 1 tablet (0.4 mg total) under the tongue every 5 (five) minutes as needed for chest pain. Reported on 08/04/2015 25 tablet 6  . pantoprazole (PROTONIX) 40 MG tablet Take 40 mg by mouth daily.     . potassium chloride (K-DUR) 10 MEQ tablet Take 1 tablet (10 mEq total) by mouth daily. 180 tablet 0  . sodium chloride (OCEAN) 0.65 % SOLN nasal  spray Place 1 spray into both nostrils 2 (two) times daily.     No current facility-administered medications for this visit.     Past Medical History:  Diagnosis Date  . Anxiety   . Arthritis   . Atrial fibrillation ([place])   . Bursitis    Left shoulder  . Cataract   . CHF (congestive heart failure) ([place])   . COPD (chronic obstructive pulmonary disease) ([place])   . Coronary atherosclerosis of native coronary artery    a. s/p DES to LCx in 04/2013 b. cath in 11/2015 showing patent stent with 20% prox-LAD and 80% ostial RCA stenosis for which medical management was recommended due to small artery size  . Depression   . Diastolic heart failure (HCC)    EF 55-60%  . Dysphagia, unspecified(787.20)   . Dyspnea   . Dysrhythmia   . Essential hypertension   . GERD (gastroesophageal reflux disease)    Hx Schatzki's ring, multiple EGD/ED last 01/06/2004  . Gout   . Headache   . History of anemia   . Hyperlipidemia   . Internal hemorrhoids without mention of complication   . MI (myocardial infarction) ([place]) 2006  . Microscopic colitis 2003  . Panic disorder without agoraphobia   . Paresthesia   . Pneumonia 12/2011  . PVD (peripheral vascular disease) ([place])   . S/P colonoscopy 09/27/2001   internal hemorrhoids, desc colon inflam polyp, SB BX-chronic duodenitis, colitis  . Thyroid disease     ROS:   All systems reviewed and negative except as noted in the HPI.   Past Surgical History:  Procedure Laterality Date  . ABDOMINAL HYSTERECTOMY    . ABDOMINAL HYSTERECTOMY    . ANTERIOR AND POSTERIOR REPAIR     with resection of vagina  . ANTERIOR LAT LUMBAR FUSION N/A 08/01/2016   Procedure: Lumbar Two-Lumbar Five Transpsoas lateral interbody fusion with Lumbar Two-Three lateral plate fixation;  Surgeon: Kevan Ny Ditty, MD;  Location: [facility];  Service: Neurosurgery;  Laterality: N/A;  L2-5 Transpsoas lateral interbody fusion with L2-3 lateral plate fixation  . APPENDECTOMY    . BACK  SURGERY    . BIOPSY  07/05/2015   Procedure: BIOPSY;  Surgeon: Daneil Dolin, MD;  [facility];  Service: Endoscopy;;  gastric polyp biopsy, ascending colon biopsy  . BLADDER SUSPENSION  11/09/2011   Procedure: TRANSVAGINAL TAPE (TVT) PROCEDURE;  Surgeon: Marissa Nestle, MD;  Location: [facility];  Service: Urology;  Laterality: N/A;  . bladder tack  06/2010  . BREAST LUMPECTOMY  1998   left, benign  . CARDIAC CATHETERIZATION    . CARDIAC CATHETERIZATION    . CARDIAC CATHETERIZATION N/A 12/16/2015   Procedure: Left Heart Cath and Coronary Angiography;  Surgeon: Troy Sine, MD;  Location: [facility];  Service: Cardiovascular;  Laterality: N/A;  . CARDIOVERSION N/A 10/04/2017   Procedure: CARDIOVERSION;  Surgeon: Herminio Commons, MD;  Location: [facility];  Service: Cardiovascular;  Laterality: N/A;  . CARDIOVERSION N/A 01/30/2018   Procedure: CARDIOVERSION;  Surgeon: Herminio Commons, MD;  [facility];  Service: Cardiovascular;  Laterality: N/A;  . [place]   left  . cataract surgery    . CHOLECYSTECTOMY  1998  . Cholecystectomy    . COLONOSCOPY  03/16/2011   multiple hyperplastic colon polyps, sigmoid diverticulosis, melanosis coli  . COLONOSCOPY WITH PROPOFOL N/A 07/05/2015   RMR:one 5 mm polyp in descending colon  . CORONARY ANGIOGRAPHY N/A 05/16/2018   Procedure: CORONARY ANGIOGRAPHY (CATH LAB);  Surgeon: Belva Crome, MD;  Location: [facility];  Service: Cardiovascular;  Laterality: N/A;  . CORONARY ANGIOPLASTY WITH STENT PLACEMENT    . ESOPHAGEAL DILATION N/A 07/05/2015   Procedure: ESOPHAGEAL DILATION;  Surgeon: Daneil Dolin, MD;  [facility];  Service: Endoscopy;  Laterality: N/A;  . ESOPHAGOGASTRODUODENOSCOPY (EGD) WITH PROPOFOL N/A 07/05/2015   [id number]  . JOINT REPLACEMENT Right 2007   right knee  . left hand surgery    . LEFT HEART CATHETERIZATION WITH CORONARY ANGIOGRAM N/A 05/14/2013    Procedure: LEFT HEART CATHETERIZATION WITH CORONARY ANGIOGRAM;  Surgeon: Blane Ohara, MD;  Location: [facility] C[facility] LAB;  Service: Cardiovascular;  Laterality: N/A;  . left rotator cuff surgery    . LUMBAR LAMINECTOMY/DECOMPRESSION MICRODISCECTOMY N/A 10/11/2012   Procedure: LUMBAR LAMINECTOMY/DECOMPRESSION MICRODISCECTOMY 2 LEVELS;  Surgeon: Floyce Stakes, MD;  Location: [facility];  Service: Neurosurgery;  Laterality: N/A;  L3-4 L4-5 Laminectomy  . LUMBAR WOUND DEBRIDEMENT N/A 09/27/2015   Procedure: Exploration of Lumbar Wound w/ Repair CSF Leak/Lumbar Drain Placement;  Surgeon: Leeroy Cha, MD;  Location: [facility];  Service: Neurosurgery;  Laterality: N/A;  . MALONEY DILATION  03/16/2011   Gastritis. No H.pylori on bx. 6F maloney dilation with disruption of  occult cevical esophageal web  . NASAL SINUS SURGERY    . right knee replacement  2007  . right leg benign tumor    . SHOULDER SURGERY Left   . TEE WITHOUT CARDIOVERSION N/A 10/04/2017   Procedure: TRANSESOPHAGEAL ECHOCARDIOGRAM (TEE) WITH PROPOFOL;  Surgeon: Herminio Commons, MD;  [facility];  Service: Cardiovascular;  Laterality: N/A;  . TONSILLECTOMY    . unspecified area, hysterectomy  1972   partial     Family History  Problem Relation Age of Onset  . Stroke Mother   . Parkinson's disease Father   . Coronary artery disease Other        family Hx-sons  . Cancer Other   . Stroke Other        family Hx  . Hypertension Other        family Hx  . Diabetes Brother   . Heart disease Son        before age 50  . Diabetes Son   . Stroke Daughter 92  . Colon cancer Neg Hx      Social History   Socioeconomic History  . Marital status: Divorced    Spouse name: Not on file  . Number of children: 5  . Years of education: Not on file  . Highest education level: Not on file  Occupational History  . Occupation: retired  Tobacco Use  . Smoking status: Former Smoker  Packs/day: 1.00    Years: 64.00     Pack years: 64.00    Types: Cigarettes    Start date: 12/24/1947    Quit date: 11/17/2001    Years since quitting: 17.4  . Smokeless tobacco: Never Used  . Tobacco comment: Quit smoking in 2003  Substance and Sexual Activity  . Alcohol use: No    Alcohol/week: 0.0 standard drinks  . Drug use: No  . Sexual activity: Never  Other Topics Concern  . Not on file  Social History Narrative   Divorced.   Sister had colon perforation & died from complications in [place], [place]   Social Determinants of Health   Financial Resource Strain:   . Difficulty of Paying Living Expenses: Not on file  Food Insecurity:   . Worried About Charity fundraiser in the Last Year: Not on file  . Ran Out of Food in the Last Year: Not on file  Transportation Needs:   . Lack of Transportation (Medical): Not on file  . Lack of Transportation (Non-Medical): Not on file  Physical Activity:   . Days of Exercise per Week: Not on file  . Minutes of Exercise per Session: Not on file  Stress:   . Feeling of Stress : Not on file  Social Connections:   . Frequency of Communication with Friends and Family: Not on file  . Frequency of Social Gatherings with Friends and Family: Not on file  . Attends Religious Services: Not on file  . Active Member of Clubs or Organizations: Not on file  . Attends Archivist Meetings: Not on file  . Marital Status: Not on file  Intimate Partner Violence:   . Fear of Current or Ex-Partner: Not on file  . Emotionally Abused: Not on file  . Physically Abused: Not on file  . Sexually Abused: Not on file     BP (!) 148/68   Pulse 91   Ht 5\' 3"  (1.6 m)   Wt 164 lb (74.4 kg)   SpO2 98%   BMI 29.05 kg/m   Physical Exam:  Well appearing 86 yo woman, NAD HEENT: Unremarkable Neck:  6 cm JVD, no thyromegally Lymphatics:  No adenopathy Back:  No CVA tenderness Lungs:  Clear HEART:  IRegular rate rhythm, no murmurs, no rubs, no clicks Abd:  soft, positive bowel sounds, no  organomegally, no rebound, no guarding Ext:  2 plus pulses on left, trace on right, no edema, no cyanosis, no clubbing Skin:  No rashes no nodules Neuro:  CN II through XII intact, motor grossly intact  Cardiac monitor - reviewed. Atrial fib with a controlled VR. NSVT   Assess/Plan: 1. Preoperative evaluation - her preoperative risk is acceptable/moderate and I do not recommend additional imaging.  2. Coags - with regard to her systemic anti-coagulation, she will continue and it would be ok to stop her eliquis 3 days prior to surgery, starting back when ever her surgeon felt the risk was acceptable. The half life of Eliquis is 6 hours.  3. CAD - she does not have angina. She does have CAD. No indication for additional testing at this point based on the proposed back surgery. 4. Atrial fib - her rates are controlled. She will continue her beta blocker. 5. NSVT - she is asymptomatic. Contiue beta blocker.  Mikle Bosworth.D.

## 2019-04-22 NOTE — Patient Instructions (Signed)
Medication Instructions:  Your physician recommends that you continue on your current medications as directed. Please refer to the Current Medication list given to you today.  *If you need a refill on your cardiac medications before your next appointment, please call your pharmacy*  Lab Work: None  If you have labs (blood work) drawn today and your tests are completely normal, you will receive your results only by: [name] Kitchen MyChart Message (if you have MyChart) OR . A paper copy in the mail If you have any lab test that is abnormal or we need to change your treatment, we will call you to review the results.  Testing/Procedures: None  Follow-Up: At [facility], you and your health needs are our priority.  As part of our continuing mission to provide you with exceptional heart care, we have created designated Provider Care Teams.  These Care Teams include your primary Cardiologist (physician) and Advanced Practice Providers (APPs -  Physician Assistants and Nurse Practitioners) who all work together to provide you with the care you need, when you need it.  Your next appointment:   12 month(s)  The format for your next appointment:   In Person  Provider:   Cristopher Peru, MD  Other Instructions None     Thank you for choosing [facility] !

## 2019-04-30 DIAGNOSIS — R0602 Shortness of breath: Secondary | ICD-10-CM | POA: Diagnosis not present

## 2019-04-30 DIAGNOSIS — M109 Gout, unspecified: Secondary | ICD-10-CM | POA: Diagnosis not present

## 2019-04-30 DIAGNOSIS — Z20828 Contact with and (suspected) exposure to other viral communicable diseases: Secondary | ICD-10-CM | POA: Diagnosis not present

## 2019-04-30 DIAGNOSIS — M545 Low back pain: Secondary | ICD-10-CM | POA: Diagnosis not present

## 2019-04-30 DIAGNOSIS — J449 Chronic obstructive pulmonary disease, unspecified: Secondary | ICD-10-CM | POA: Diagnosis not present

## 2019-04-30 DIAGNOSIS — F329 Major depressive disorder, single episode, unspecified: Secondary | ICD-10-CM | POA: Diagnosis not present

## 2019-04-30 DIAGNOSIS — I10 Essential (primary) hypertension: Secondary | ICD-10-CM | POA: Diagnosis not present

## 2019-04-30 DIAGNOSIS — N183 Chronic kidney disease, stage 3 unspecified: Secondary | ICD-10-CM | POA: Diagnosis not present

## 2019-04-30 DIAGNOSIS — I5033 Acute on chronic diastolic (congestive) heart failure: Secondary | ICD-10-CM | POA: Diagnosis not present

## 2019-04-30 DIAGNOSIS — I482 Chronic atrial fibrillation, unspecified: Secondary | ICD-10-CM | POA: Diagnosis not present

## 2019-04-30 DIAGNOSIS — F419 Anxiety disorder, unspecified: Secondary | ICD-10-CM | POA: Diagnosis not present

## 2019-04-30 DIAGNOSIS — I739 Peripheral vascular disease, unspecified: Secondary | ICD-10-CM | POA: Diagnosis not present

## 2019-05-06 DIAGNOSIS — M25512 Pain in left shoulder: Secondary | ICD-10-CM | POA: Diagnosis not present

## 2019-05-06 DIAGNOSIS — R55 Syncope and collapse: Secondary | ICD-10-CM | POA: Diagnosis not present

## 2019-05-06 DIAGNOSIS — R519 Headache, unspecified: Secondary | ICD-10-CM | POA: Diagnosis not present

## 2019-05-06 DIAGNOSIS — I739 Peripheral vascular disease, unspecified: Secondary | ICD-10-CM | POA: Diagnosis not present

## 2019-05-06 DIAGNOSIS — R6884 Jaw pain: Secondary | ICD-10-CM | POA: Diagnosis not present

## 2019-05-06 DIAGNOSIS — R200 Anesthesia of skin: Secondary | ICD-10-CM | POA: Diagnosis not present

## 2019-05-07 DIAGNOSIS — R042 Hemoptysis: Secondary | ICD-10-CM | POA: Diagnosis not present

## 2019-05-07 DIAGNOSIS — R0781 Pleurodynia: Secondary | ICD-10-CM | POA: Diagnosis not present

## 2019-05-07 DIAGNOSIS — S0003XA Contusion of scalp, initial encounter: Secondary | ICD-10-CM | POA: Diagnosis not present

## 2019-05-07 DIAGNOSIS — I10 Essential (primary) hypertension: Secondary | ICD-10-CM | POA: Diagnosis not present

## 2019-05-07 DIAGNOSIS — R55 Syncope and collapse: Secondary | ICD-10-CM | POA: Diagnosis not present

## 2019-05-07 DIAGNOSIS — E7800 Pure hypercholesterolemia, unspecified: Secondary | ICD-10-CM | POA: Diagnosis not present

## 2019-05-07 DIAGNOSIS — W1839XA Other fall on same level, initial encounter: Secondary | ICD-10-CM | POA: Diagnosis not present

## 2019-05-07 DIAGNOSIS — S0993XA Unspecified injury of face, initial encounter: Secondary | ICD-10-CM | POA: Diagnosis not present

## 2019-05-07 DIAGNOSIS — S0512XA Contusion of eyeball and orbital tissues, left eye, initial encounter: Secondary | ICD-10-CM | POA: Diagnosis not present

## 2019-05-07 DIAGNOSIS — N390 Urinary tract infection, site not specified: Secondary | ICD-10-CM | POA: Diagnosis not present

## 2019-05-07 DIAGNOSIS — I519 Heart disease, unspecified: Secondary | ICD-10-CM | POA: Diagnosis not present

## 2019-05-07 DIAGNOSIS — S199XXA Unspecified injury of neck, initial encounter: Secondary | ICD-10-CM | POA: Diagnosis not present

## 2019-05-07 DIAGNOSIS — R6884 Jaw pain: Secondary | ICD-10-CM | POA: Diagnosis not present

## 2019-05-07 DIAGNOSIS — I700 Atherosclerosis of aorta: Secondary | ICD-10-CM | POA: Diagnosis not present

## 2019-05-07 DIAGNOSIS — K219 Gastro-esophageal reflux disease without esophagitis: Secondary | ICD-10-CM | POA: Diagnosis not present

## 2019-05-07 DIAGNOSIS — S299XXA Unspecified injury of thorax, initial encounter: Secondary | ICD-10-CM | POA: Diagnosis not present

## 2019-05-07 DIAGNOSIS — Z20822 Contact with and (suspected) exposure to covid-19: Secondary | ICD-10-CM | POA: Diagnosis not present

## 2019-05-12 ENCOUNTER — Other Ambulatory Visit: Payer: Self-pay

## 2019-05-14 DIAGNOSIS — J320 Chronic maxillary sinusitis: Secondary | ICD-10-CM | POA: Diagnosis not present

## 2019-05-14 DIAGNOSIS — R519 Headache, unspecified: Secondary | ICD-10-CM | POA: Diagnosis not present

## 2019-05-14 DIAGNOSIS — S0990XA Unspecified injury of head, initial encounter: Secondary | ICD-10-CM | POA: Diagnosis not present

## 2019-05-14 DIAGNOSIS — R55 Syncope and collapse: Secondary | ICD-10-CM | POA: Diagnosis not present

## 2019-05-14 DIAGNOSIS — S0003XA Contusion of scalp, initial encounter: Secondary | ICD-10-CM | POA: Diagnosis not present

## 2019-05-15 ENCOUNTER — Telehealth: Payer: Self-pay

## 2019-05-15 NOTE — Telephone Encounter (Signed)
-----   Message from Imogene Burn, PA-C sent at 05/14/2019  7:50 AM EST ----- Monitor didn't show Afib but 6 beats NSVT. Amiodarone stopped by PCP. Please make appt with Dr. Lovena Le ASAP

## 2019-05-15 NOTE — Telephone Encounter (Signed)
I spoke with patient, apt made with Dr.Taylor on 06/06/2019 at 8:45 am

## 2019-05-26 ENCOUNTER — Ambulatory Visit: Payer: Medicare Other | Admitting: Cardiovascular Disease

## 2019-05-28 ENCOUNTER — Telehealth: Payer: Self-pay | Admitting: Cardiovascular Disease

## 2019-05-28 NOTE — Telephone Encounter (Signed)
Virtual Visit Pre-Appointment Phone Call  "(Name), I am calling you today to discuss your upcoming appointment. We are currently trying to limit exposure to the virus that causes COVID-19 by seeing patients at home rather than in the office."  1. "What is the BEST phone number to call the day of the visit?" - [phone]  2. Do you have or have access to (through a family member/friend) a smartphone with video capability that we can use for your visit?" a. If yes - list this number in appt notes as cell (if different from BEST phone #) and list the appointment type as a VIDEO visit in appointment notes b. If no - list the appointment type as a PHONE visit in appointment notes  3. Confirm consent - "In the setting of the current Covid19 crisis, you are scheduled for a (phone or video) visit with your provider on (date) at (time).  Just as we do with many in-office visits, in order for you to participate in this visit, we must obtain consent.  If you'd like, I can send this to your mychart (if signed up) or email for you to review.  Otherwise, I can obtain your verbal consent now.  All virtual visits are billed to your insurance company just like a normal visit would be.  By agreeing to a virtual visit, we'd like you to understand that the technology does not allow for your provider to perform an examination, and thus may limit your provider's ability to fully assess your condition. If your provider identifies any concerns that need to be evaluated in person, we will make arrangements to do so.  Finally, though the technology is pretty good, we cannot assure that it will always work on either your or our end, and in the setting of a video visit, we may have to convert it to a phone-only visit.  In either situation, we cannot ensure that we have a secure connection.  Are you willing to proceed?" STAFF: Did the patient verbally acknowledge consent to telehealth visit? Document YES/NO  here:YES  4. Advise patient to be prepared - "Two hours prior to your appointment, go ahead and check your blood pressure, pulse, oxygen saturation, and your weight (if you have the equipment to check those) and write them all down. When your visit starts, your provider will ask you for this information. If you have an Apple Watch or Kardia device, please plan to have heart rate information ready on the day of your appointment. Please have a pen and paper handy nearby the day of the visit as well."  5. Give patient instructions for MyChart download to smartphone OR Doximity/Doxy.me as below if video visit (depending on what platform provider is using)  6. Inform patient they will receive a phone call 15 minutes prior to their appointment time (may be from unknown caller ID) so they should be prepared to answer    TELEPHONE CALL NOTE  [name] has been deemed a candidate for a follow-up tele-health visit to limit community exposure during the Covid-19 pandemic. I spoke with the patient via phone to ensure availability of phone/video source, confirm preferred email & phone number, and discuss instructions and expectations.  I reminded [name] to be prepared with any vital sign and/or heart rhythm information that could potentially be obtained via home monitoring, at the time of her visit. I reminded [name] to expect a phone call prior to her visit.  Tonya T  [name] 05/28/2019 10:48 AM   INSTRUCTIONS FOR DOWNLOADING THE MYCHART APP TO SMARTPHONE  - The patient must first make sure to have activated MyChart and know their login information - If Apple, go to [employer] and type in MyChart in the search bar and download the app. If Android, ask patient to go to [employer] and type in [place] in the search bar and download the app. The app is free but as with any other app downloads, their phone may require them to verify saved payment information or Apple/Android password.   - The patient will need to then log into the app with their MyChart username and password, and select [facility] as their healthcare provider to link the account. When it is time for your visit, go to the MyChart app, find appointments, and click Begin Video Visit. Be sure to Select Allow for your device to access the Microphone and Camera for your visit. You will then be connected, and your provider will be with you shortly.  **If they have any issues connecting, or need assistance please contact MyChart service desk [phone])**  **If using a computer, in order to ensure the best quality for their visit they will need to use either of the following Internet Browsers: [employer], or Google Chrome**  IF USING DOXIMITY or DOXY.ME - The patient will receive a link just prior to their visit by text.     FULL LENGTH CONSENT FOR TELE-HEALTH VISIT   I hereby voluntarily request, consent and authorize [facility] and its employed or contracted physicians, physician assistants, nurse practitioners or other licensed health care professionals (the Practitioner), to provide me with telemedicine health care services (the Services") as deemed necessary by the treating Practitioner. I acknowledge and consent to receive the Services by the Practitioner via telemedicine. I understand that the telemedicine visit will involve communicating with the Practitioner through live audiovisual communication technology and the disclosure of certain medical information by electronic transmission. I acknowledge that I have been given the opportunity to request an in-person assessment or other available alternative prior to the telemedicine visit and am voluntarily participating in the telemedicine visit.  I understand that I have the right to withhold or withdraw my consent to the use of telemedicine in the course of my care at any time, without affecting my right to future care or treatment, and that  the Practitioner or I may terminate the telemedicine visit at any time. I understand that I have the right to inspect all information obtained and/or recorded in the course of the telemedicine visit and may receive copies of available information for a reasonable fee.  I understand that some of the potential risks of receiving the Services via telemedicine include:   Delay or interruption in medical evaluation due to technological equipment failure or disruption;  Information transmitted may not be sufficient (e.g. poor resolution of images) to allow for appropriate medical decision making by the Practitioner; and/or   In rare instances, security protocols could fail, causing a breach of personal health information.  Furthermore, I acknowledge that it is my responsibility to provide information about my medical history, conditions and care that is complete and accurate to the best of my ability. I acknowledge that Practitioner's advice, recommendations, and/or decision may be based on factors not within their control, such as incomplete or inaccurate data provided by me or distortions of diagnostic images or specimens that may result from electronic transmissions. I understand that the practice of  medicine is not an Chief Strategy Officer and that Practitioner makes no warranties or guarantees regarding treatment outcomes. I acknowledge that I will receive a copy of this consent concurrently upon execution via email to the email address I last provided but may also request a printed copy by calling the office of [facility].    I understand that my insurance will be billed for this visit.   I have read or had this consent read to me.  I understand the contents of this consent, which adequately explains the benefits and risks of the Services being provided via telemedicine.   I have been provided ample opportunity to ask questions regarding this consent and the Services and have had my questions answered to  my satisfaction.  I give my informed consent for the services to be provided through the use of telemedicine in my medical care  By participating in this telemedicine visit I agree to the above.

## 2019-05-29 ENCOUNTER — Encounter: Payer: Self-pay | Admitting: Cardiovascular Disease

## 2019-05-29 ENCOUNTER — Telehealth (INDEPENDENT_AMBULATORY_CARE_PROVIDER_SITE_OTHER): Payer: Medicare HMO | Admitting: Cardiovascular Disease

## 2019-05-29 VITALS — BP 153/68 | HR 75 | Ht 61.0 in | Wt 167.0 lb

## 2019-05-29 DIAGNOSIS — E785 Hyperlipidemia, unspecified: Secondary | ICD-10-CM

## 2019-05-29 DIAGNOSIS — I4729 Other ventricular tachycardia: Secondary | ICD-10-CM

## 2019-05-29 DIAGNOSIS — I4811 Longstanding persistent atrial fibrillation: Secondary | ICD-10-CM

## 2019-05-29 DIAGNOSIS — I25118 Atherosclerotic heart disease of native coronary artery with other forms of angina pectoris: Secondary | ICD-10-CM

## 2019-05-29 DIAGNOSIS — N183 Chronic kidney disease, stage 3 unspecified: Secondary | ICD-10-CM

## 2019-05-29 DIAGNOSIS — R0609 Other forms of dyspnea: Secondary | ICD-10-CM

## 2019-05-29 DIAGNOSIS — I5032 Chronic diastolic (congestive) heart failure: Secondary | ICD-10-CM

## 2019-05-29 DIAGNOSIS — I472 Ventricular tachycardia: Secondary | ICD-10-CM

## 2019-05-29 NOTE — Progress Notes (Signed)
Virtual Visit via Telephone Note   This visit type was conducted due to national recommendations for restrictions regarding the COVID-19 Pandemic (e.g. social distancing) in an effort to limit this patient's exposure and mitigate transmission in our community.  Due to her co-morbid illnesses, this patient is at least at moderate risk for complications without adequate follow up.  This format is felt to be most appropriate for this patient at this time.  The patient did not have access to video technology/had technical difficulties with video requiring transitioning to audio format only (telephone).  All issues noted in this document were discussed and addressed.  No physical exam could be performed with this format.  Please refer to the patient's chart for her  consent to telehealth for [facility].   Date:  05/29/2019   ID:  [name], DOB 1932-12-11, MRN [id number]  Patient Location: Home Provider Location: Office  PCP:  Rosalee Kaufman, PA-C  Cardiologist:  Kate Sable, MD  Electrophysiologist:  Cristopher Peru, MD   Evaluation Performed:  Follow-Up Visit  Chief Complaint:  Syncope, CAD  History of Present Illness:    [name] is a 86 y.o. female with CAD.  Cardiac catheterization in 2020 done for chest pain demonstrated findings similar to cardiac catheterization in 2017.  She has chronic exertional dyspnea which is felt secondary to deconditioning and has NYHA class II symptoms.  She has multiple somatic complaints including back pain, knee pain, feet pain, and insomnia.  She also says she is depressed.  Her son died from a motorcycle accident on 03/30/2019.  She has been having panic attacks since then.  In January she said she stood up too quickly after having a bowel movement and passed out.  She first saw her PCP and was directed to go to [facility].  She told me she was evaluated there in the ED but did not stay overnight.  I will have to request  records.  She recently wore an event monitor which demonstrated nonsustained ventricular tachycardia.  She follows with Dr. Lovena Le.  She has chronic left-sided chest pains and changing position in bed helps.  She seldom has palpitations.   Past Medical History:  Diagnosis Date  . Anxiety   . Arthritis   . Atrial fibrillation ([place])   . Bursitis    Left shoulder  . Cataract   . CHF (congestive heart failure) ([place])   . COPD (chronic obstructive pulmonary disease) ([place])   . Coronary atherosclerosis of native coronary artery    a. s/p DES to LCx in 04/2013 b. cath in 11/2015 showing patent stent with 20% prox-LAD and 80% ostial RCA stenosis for which medical management was recommended due to small artery size  . Depression   . Diastolic heart failure (HCC)    EF 55-60%  . Dysphagia, unspecified(787.20)   . Dyspnea   . Dysrhythmia   . Essential hypertension   . GERD (gastroesophageal reflux disease)    Hx Schatzki's ring, multiple EGD/ED last 01/06/2004  . Gout   . Headache   . History of anemia   . Hyperlipidemia   . Internal hemorrhoids without mention of complication   . MI (myocardial infarction) ([place]) 2006  . Microscopic colitis 2003  . Panic disorder without agoraphobia   . Paresthesia   . Pneumonia 12/2011  . PVD (peripheral vascular disease) ([place])   . S/P colonoscopy 09/27/2001   internal hemorrhoids, desc colon inflam polyp, SB BX-chronic duodenitis, colitis  . Thyroid  disease    Past Surgical History:  Procedure Laterality Date  . ABDOMINAL HYSTERECTOMY    . ABDOMINAL HYSTERECTOMY    . ANTERIOR AND POSTERIOR REPAIR     with resection of vagina  . ANTERIOR LAT LUMBAR FUSION N/A 08/01/2016   Procedure: Lumbar Two-Lumbar Five Transpsoas lateral interbody fusion with Lumbar Two-Three lateral plate fixation;  Surgeon: Kevan Ny Ditty, MD;  Location: [place];  Service: Neurosurgery;  Laterality: N/A;  L2-5 Transpsoas lateral interbody fusion with L2-3 lateral plate  fixation  . APPENDECTOMY    . BACK SURGERY    . BIOPSY  07/05/2015   Procedure: BIOPSY;  Surgeon: Daneil Dolin, MD;  [facility];  Service: Endoscopy;;  gastric polyp biopsy, ascending colon biopsy  . BLADDER SUSPENSION  11/09/2011   Procedure: TRANSVAGINAL TAPE (TVT) PROCEDURE;  Surgeon: Marissa Nestle, MD;  Location: [facility] ORS;  Service: Urology;  Laterality: N/A;  . bladder tack  06/2010  . BREAST LUMPECTOMY  1998   left, benign  . CARDIAC CATHETERIZATION    . CARDIAC CATHETERIZATION    . CARDIAC CATHETERIZATION N/A 12/16/2015   Procedure: Left Heart Cath and Coronary Angiography;  Surgeon: Troy Sine, MD;  Location: [place] CV LAB;  Service: Cardiovascular;  Laterality: N/A;  . CARDIOVERSION N/A 10/04/2017   Procedure: CARDIOVERSION;  Surgeon: Herminio Commons, MD;  Location: [facility] ORS;  Service: Cardiovascular;  Laterality: N/A;  . CARDIOVERSION N/A 01/30/2018   Procedure: CARDIOVERSION;  Surgeon: Herminio Commons, MD;  [facility];  Service: Cardiovascular;  Laterality: N/A;  . [place]   left  . cataract surgery    . CHOLECYSTECTOMY  1998  . Cholecystectomy    . COLONOSCOPY  03/16/2011   multiple hyperplastic colon polyps, sigmoid diverticulosis, melanosis coli  . COLONOSCOPY WITH PROPOFOL N/A 07/05/2015   RMR:one 5 mm polyp in descending colon  . CORONARY ANGIOGRAPHY N/A 05/16/2018   Procedure: CORONARY ANGIOGRAPHY (CATH LAB);  Surgeon: Belva Crome, MD;  Location: [place] CV LAB;  Service: Cardiovascular;  Laterality: N/A;  . CORONARY ANGIOPLASTY WITH STENT PLACEMENT    . ESOPHAGEAL DILATION N/A 07/05/2015   Procedure: ESOPHAGEAL DILATION;  Surgeon: Daneil Dolin, MD;  [facility];  Service: Endoscopy;  Laterality: N/A;  . ESOPHAGOGASTRODUODENOSCOPY (EGD) WITH PROPOFOL N/A 07/05/2015   RWE:RXVQMG  . JOINT REPLACEMENT Right 2007   right knee  . left hand surgery    . LEFT HEART CATHETERIZATION WITH  CORONARY ANGIOGRAM N/A 05/14/2013   Procedure: LEFT HEART CATHETERIZATION WITH CORONARY ANGIOGRAM;  Surgeon: Blane Ohara, MD;  Location: [facility] CATH LAB;  Service: Cardiovascular;  Laterality: N/A;  . left rotator cuff surgery    . LUMBAR LAMINECTOMY/DECOMPRESSION MICRODISCECTOMY N/A 10/11/2012   Procedure: LUMBAR LAMINECTOMY/DECOMPRESSION MICRODISCECTOMY 2 LEVELS;  Surgeon: Floyce Stakes, MD;  Location: [place] NEURO ORS;  Service: Neurosurgery;  Laterality: N/A;  L3-4 L4-5 Laminectomy  . LUMBAR WOUND DEBRIDEMENT N/A 09/27/2015   Procedure: Exploration of Lumbar Wound w/ Repair CSF Leak/Lumbar Drain Placement;  Surgeon: Leeroy Cha, MD;  Location: [place] NEURO ORS;  Service: Neurosurgery;  Laterality: N/A;  . MALONEY DILATION  03/16/2011   Gastritis. No H.pylori on bx. 64F maloney dilation with disruption of  occult cevical esophageal web  . NASAL SINUS SURGERY    . right knee replacement  2007  . right leg benign tumor    . SHOULDER SURGERY Left   . TEE WITHOUT CARDIOVERSION N/A 10/04/2017  Procedure: TRANSESOPHAGEAL ECHOCARDIOGRAM (TEE) WITH PROPOFOL;  Surgeon: Herminio Commons, MD;  Location[facility];  Service: Cardiovascular;  Laterality: N/A;  . TONSILLECTOMY    . unspecified area, hysterectomy  1972   partial     Current Meds  Medication Sig  . acetaminophen (TYLENOL) 500 MG tablet Take 500 mg by mouth every 6 (six) hours as needed for headache.   . ALPRAZolam (XANAX) 0.25 MG tablet Take 0.25 mg by mouth 2 (two) times daily as needed for anxiety.   [name] Kitchen apixaban (ELIQUIS) 5 MG TABS tablet Take 1 tablet (5 mg total) by mouth 2 (two) times daily. Resume with AM dose on 05/17/2018  . colchicine 0.6 MG tablet Take 1 tablet (0.6 mg total) by mouth daily.  . DULoxetine (CYMBALTA) 60 MG capsule Take 60 mg by mouth at bedtime.   . fluticasone (FLONASE) 50 MCG/ACT nasal spray Place 2 sprays into both nostrils 2 (two) times daily as needed for allergies.   . furosemide (LASIX) 40 MG tablet Take  0.5 tablets (20 mg total) by mouth daily.  [name] Kitchen ipratropium-albuterol (DUONEB) 0.5-2.5 (3) MG/3ML SOLN Take 3 mLs by nebulization every 6 (six) hours.  . isosorbide mononitrate (IMDUR) 60 MG 24 hr tablet Take 1 tablet (60 mg total) by mouth daily.  [name] Kitchen levothyroxine (SYNTHROID, LEVOTHROID) 25 MCG tablet Take 1 tablet by mouth daily.  . magnesium oxide (MAG-OX) 400 MG tablet TAKE ONE TABLET BY MOUTH DAILY. (Patient taking differently: Take 400 mg by mouth daily. )  . methocarbamol (ROBAXIN) 500 MG tablet Take 500 mg by mouth every 6 (six) hours as needed for muscle spasms.   . metoprolol succinate (TOPROL-XL) 50 MG 24 hr tablet Take 1 tablet (50 mg total) by mouth daily.  . Multiple Vitamin (MULTIVITAMIN WITH MINERALS) TABS tablet Take 1 tablet by mouth daily. Centrum   . nitroGLYCERIN (NITROSTAT) 0.4 MG SL tablet Place 1 tablet (0.4 mg total) under the tongue every 5 (five) minutes as needed for chest pain. Reported on 08/04/2015  . pantoprazole (PROTONIX) 40 MG tablet Take 40 mg by mouth daily.   . potassium chloride (K-DUR) 10 MEQ tablet Take 1 tablet (10 mEq total) by mouth daily.  . sodium chloride (OCEAN) 0.65 % SOLN nasal spray Place 1 spray into both nostrils 2 (two) times daily.     Allergies:   Cephalosporins, Levaquin [levofloxacin in d5w], Macrodantin [nitrofurantoin macrocrystal], Phenothiazines, Polysorbate, Prednisone, Buspirone, Cardura [doxazosin mesylate], Codeine, Acyclovir and related, Colcrys [colchicine], Prochlorperazine, Ranexa [ranolazine], Atorvastatin, Ofloxacin, Other, Penicillins, and Pimozide   Social History   Tobacco Use  . Smoking status: Former Smoker    Packs/day: 1.00    Years: 64.00    Pack years: 64.00    Types: Cigarettes    Start date: 12/24/1947    Quit date: 11/17/2001    Years since quitting: 17.5  . Smokeless tobacco: Never Used  . Tobacco comment: Quit smoking in 2003  Substance Use Topics  . Alcohol use: No    Alcohol/week: 0.0 standard drinks  .  Drug use: No     Family Hx: The patient's family history includes Cancer in an other family member; Coronary artery disease in an other family member; Diabetes in her brother and son; Heart disease in her son; Hypertension in an other family member; Parkinson's disease in her father; Stroke in her mother and another family member; Stroke (age of onset: 33) in her daughter. There is no history of Colon cancer.  ROS:   Please see the  history of present illness.     All other systems reviewed and are negative.   Prior CV studies:   The following studies were reviewed today:  Cardiac Catheterization: 05/16/2018  Patent short left main  20 to 30% proximal LAD irregularity, with diffuse 20% narrowing within the mid vessel.  Patent proximal circumflex stent with mid to distal circumflex irregularities up to 30 to 40%.  Nondominant right coronary with ostial 90% narrowing.  No hemodynamics recorded. Left ventriculography was not performed in an effort to decrease contrast exposure in the setting of CKD.  RECOMMENDATIONS:   Compared to prior angiography in 2017, no significant changes noted.  Chest pain is atypical and may be more musculoskeletal or pleuritic.  Resume Eliquis in 12 hours/a.m. of 05/17/2018    Echocardiogram 10/02/18:  1. The left ventricle has normal systolic function with an ejection  fraction of 60-65%. The cavity size was normal. There is mildly increased  left ventricular wall thickness. Left ventricular diastolic Doppler  parameters are consistent with impaired  relaxation. Elevated mean left atrial pressure.  2. The right ventricle has normal systolic function. The cavity was  normal. There is no increase in right ventricular wall thickness.  3. Left atrial size was mildly dilated.  4. The aortic valve is tricuspid. Mild thickening of the aortic valve.  Mild calcification of the aortic valve. Aortic valve regurgitation is mild  by color flow  Doppler. No stenosis of the aortic valve. Mild aortic  annular calcification noted.  5. The mitral valve is abnormal. Mild thickening of the mitral valve  leaflet. Mild calcification of the mitral valve leaflet. There is mild  mitral annular calcification present. No evidence of mitral valve  stenosis.  6. The aortic root is normal in size and structure.  7. Pulmonary hypertension is indeterminant, inadequate TR jet.    Event monitor 05/13/19:  1. NSR  2. NSVT, lasting upto 6 beats 3. No prolonged pauses or bradycardia 4. No atrial fib   Labs/Other Tests and Data Reviewed:    EKG: Event monitor reviewed.  Recent Labs: 10/03/2018: ALT 34 10/04/2018: Magnesium 1.8 03/10/2019: B Natriuretic Peptide 239.0; BUN 16; Creatinine, Ser 1.25; Hemoglobin 12.3; Platelets 263; Potassium 4.2; Sodium 137   Recent Lipid Panel Lab Results  Component Value Date/Time   CHOL 177 12/16/2015 04:35 AM   TRIG 197 (H) 12/16/2015 04:35 AM   HDL 35 (L) 12/16/2015 04:35 AM   CHOLHDL 5.1 12/16/2015 04:35 AM   LDLCALC 103 (H) 12/16/2015 04:35 AM    Wt Readings from Last 3 Encounters:  05/29/19 167 lb (75.8 kg)  04/22/19 164 lb (74.4 kg)  03/10/19 165 lb (74.8 kg)     Objective:    Vital Signs:  BP (!) 153/68   Pulse 75   Ht 5\' 1"  (1.549 m)   Wt 167 lb (75.8 kg)   SpO2 96%   BMI 31.55 kg/m    VITAL SIGNS:  reviewed  ASSESSMENT & PLAN:    1. CAD: Most recent cath reviewed above with no significant changes since 2017. No ASA given need for anticoagulation. Continue Imdur and beta blocker.  Toprol-XL was increased to 50 mg daily in November 2020.  I will increase to 75 mg daily given hypertension, palpitations, and SVT, and exertional dyspnea.  She is statin intolerant.  Chest pains are chronic and atypical.  2. Chronic diastolic heart failure: LVEF 60 to 65% by echocardiogram in June 2020. Continue lisinopril, Toprol-XL (increased to 75 mg daily) and Lasix  20 mg daily.  3. Persistent  atrial fibrillation: PCP stopped amiodarone. Remains on Toprol-XL (I will increase to 75 mg daily) and Eliquis. She has moderate to severe left atrial dilatationand is at high risk for recurrence. She has undergone cardioversions in both June and October 2019.  4. HLD:  Followed by PCP. She has been intolerant to multiple statins including low-dose Crestor.Not interested in PCSK-9 inhibitor therapy at this time.  5. CKD stage III: I will obtain most recent labs.  6. NSVT: She has occasional palpitations.  She is scheduled to see Dr. Lovena Le in the near future.  I will increase Toprol-XL to 75 mg daily.   COVID-19 Education: The signs and symptoms of COVID-19 were discussed with the patient and how to seek care for testing (follow up with PCP or arrange E-visit).  The importance of social distancing was discussed today.  Time:   Today, I have spent 25 minutes with the patient with telehealth technology discussing the above problems.     Medication Adjustments/Labs and Tests Ordered: Current medicines are reviewed at length with the patient today.  Concerns regarding medicines are outlined above.   Tests Ordered: No orders of the defined types were placed in this encounter.   Medication Changes: No orders of the defined types were placed in this encounter.   Follow Up:  Virtual Visit  in 6 month(s)  Signed, Kate Sable, MD  05/29/2019 8:43 AM    [place]

## 2019-05-30 ENCOUNTER — Encounter: Payer: Self-pay | Admitting: *Deleted

## 2019-05-30 MED ORDER — METOPROLOL SUCCINATE ER 50 MG PO TB24: ORAL_TABLET | ORAL | 3 refills | Status: DC

## 2019-05-30 NOTE — Addendum Note (Signed)
Addended by: Laurine Blazer on: 05/30/2019 03:36 PM   Modules accepted: Orders

## 2019-05-30 NOTE — Patient Instructions (Addendum)
Medication Instructions:   Increase Toprol XL to 75mg  daily - new prescription sent to [facility].   Continue all other medications.    Labwork: none  Testing/Procedures: none  Follow-Up: 6 months   Any Other Special Instructions Will Be Listed Below (If Applicable).  If you need a refill on your cardiac medications before your next appointment, please call your pharmacy.

## 2019-06-04 ENCOUNTER — Other Ambulatory Visit: Payer: Self-pay | Admitting: Interventional Cardiology

## 2019-06-06 ENCOUNTER — Ambulatory Visit: Payer: Medicare HMO | Admitting: Internal Medicine

## 2019-06-13 DIAGNOSIS — I482 Chronic atrial fibrillation, unspecified: Secondary | ICD-10-CM | POA: Diagnosis not present

## 2019-06-13 DIAGNOSIS — J449 Chronic obstructive pulmonary disease, unspecified: Secondary | ICD-10-CM | POA: Diagnosis not present

## 2019-06-17 ENCOUNTER — Other Ambulatory Visit: Payer: Self-pay | Admitting: Cardiovascular Disease

## 2019-06-17 ENCOUNTER — Other Ambulatory Visit: Payer: Self-pay | Admitting: *Deleted

## 2019-06-17 ENCOUNTER — Encounter: Payer: Self-pay | Admitting: Internal Medicine

## 2019-06-17 ENCOUNTER — Other Ambulatory Visit: Payer: Self-pay

## 2019-06-17 ENCOUNTER — Ambulatory Visit (INDEPENDENT_AMBULATORY_CARE_PROVIDER_SITE_OTHER): Payer: Medicare Other | Admitting: Internal Medicine

## 2019-06-17 VITALS — BP 173/60 | HR 76 | Temp 97.5°F | Ht 61.0 in | Wt 169.0 lb

## 2019-06-17 DIAGNOSIS — I5032 Chronic diastolic (congestive) heart failure: Secondary | ICD-10-CM

## 2019-06-17 DIAGNOSIS — I480 Paroxysmal atrial fibrillation: Secondary | ICD-10-CM | POA: Diagnosis not present

## 2019-06-17 MED ORDER — POTASSIUM CHLORIDE CRYS ER 10 MEQ PO TBCR: 10.0000 meq | EXTENDED_RELEASE_TABLET | Freq: Every day | ORAL | 1 refills | Status: DC

## 2019-06-17 NOTE — Patient Instructions (Signed)
Medication Instructions:  Your physician recommends that you continue on your current medications as directed. Please refer to the Current Medication list given to you today.  *If you need a refill on your cardiac medications before your next appointment, please call your pharmacy*   Lab Work: NONE   If you have labs (blood work) drawn today and your tests are completely normal, you will receive your results only by: . MyChart Message (if you have MyChart) OR . A paper copy in the mail If you have any lab test that is abnormal or we need to change your treatment, we will call you to review the results.   Testing/Procedures: NONE    Follow-Up: At [facility], you and your health needs are our priority.  As part of our continuing mission to provide you with exceptional heart care, we have created designated Provider Care Teams.  These Care Teams include your primary Cardiologist (physician) and Advanced Practice Providers (APPs -  Physician Assistants and Nurse Practitioners) who all work together to provide you with the care you need, when you need it.  We recommend signing up for the patient portal called "MyChart".  Sign up information is provided on this After Visit Summary.  MyChart is used to connect with patients for Virtual Visits (Telemedicine).  Patients are able to view lab/test results, encounter notes, upcoming appointments, etc.  Non-urgent messages can be sent to your provider as well.   To learn more about what you can do with MyChart, go to [email or web address].    Your next appointment:   1 year(s)  The format for your next appointment:   In Person  Provider:   Gregg Taylor, MD   Other Instructions Thank you for choosing [facility]!    

## 2019-06-17 NOTE — Progress Notes (Signed)
HPI Mrs. Schlottman returns today for followup. She is a pleasant 86 yo woman with PAF, NSVT, HTN, and chronic anti-coagulation. She has been saddened by the loss of her son who died in a motorcycle accident. She describes an episode when she passed out after standing up to quickly in the bathroom.  Allergies  Allergen Reactions  . Cephalosporins Diarrhea and Nausea Only    Lightheaded  . Levaquin [Levofloxacin In D5w] Swelling  . Macrodantin [Nitrofurantoin Macrocrystal] Swelling  . Phenothiazines Anaphylaxis and Hives  . Polysorbate Anaphylaxis  . Prednisone Shortness Of Breath  . Buspirone Itching  . Cardura [Doxazosin Mesylate] Itching  . Codeine Itching  . Acyclovir And Related Itching    Redness of skin  . Colcrys [Colchicine] Nausea Only  . Prochlorperazine Other (See Comments)    "Upset stomach"  . Ranexa [Ranolazine]     Severe drop in BP  . Atorvastatin Hives    Cramping; tolerates Crestor ok  . Ofloxacin Rash  . Other Itching and Rash    "WOOL"= make skin look like it has been burned  . Penicillins Other (See Comments)    Causes redness all over. Has patient had a PCN reaction causing immediate rash, facial/tongue/throat swelling, SOB or lightheadedness with hypotension: No Has patient had a PCN reaction causing severe rash involving mucus membranes or skin necrosis: No Has patient had a PCN reaction that required hospitalization No Has patient had a PCN reaction occurring within the last 10 years: No If all of the above answers are "NO", then may proceed with Cephalosporin use.   . Pimozide Hives and Itching     Current Outpatient Medications  Medication Sig Dispense Refill  . acetaminophen (TYLENOL) 500 MG tablet Take 500 mg by mouth every 6 (six) hours as needed for headache.     . ALPRAZolam (XANAX) 0.25 MG tablet Take 0.25 mg by mouth 2 (two) times daily as needed for anxiety.   0  . colchicine 0.6 MG tablet Take 1 tablet (0.6 mg total) by mouth daily.  40 tablet 0  . DULoxetine (CYMBALTA) 60 MG capsule Take 60 mg by mouth at bedtime.     [name] Kitchen ELIQUIS 5 MG TABS tablet TAKE ONE TABLET BY MOUTH TWICE DAILY 180 tablet 3  . fluticasone (FLONASE) 50 MCG/ACT nasal spray Place 2 sprays into both nostrils 2 (two) times daily as needed for allergies.     . furosemide (LASIX) 40 MG tablet Take 0.5 tablets (20 mg total) by mouth daily. 90 tablet 0  . ipratropium-albuterol (DUONEB) 0.5-2.5 (3) MG/3ML SOLN Take 3 mLs by nebulization every 6 (six) hours. 360 mL 0  . isosorbide mononitrate (IMDUR) 60 MG 24 hr tablet Take 1 tablet (60 mg total) by mouth daily. 90 tablet 3  . levothyroxine (SYNTHROID, LEVOTHROID) 25 MCG tablet Take 1 tablet by mouth daily.    . magnesium oxide (MAG-OX) 400 MG tablet TAKE ONE TABLET BY MOUTH DAILY. (Patient taking differently: Take 400 mg by mouth daily. ) 90 tablet 3  . methocarbamol (ROBAXIN) 500 MG tablet Take 500 mg by mouth every 6 (six) hours as needed for muscle spasms.     . metoprolol succinate (TOPROL-XL) 50 MG 24 hr tablet Take 1 1/2 tabs by mouth daily 135 tablet 3  . Multiple Vitamin (MULTIVITAMIN WITH MINERALS) TABS tablet Take 1 tablet by mouth daily. Centrum     . nitroGLYCERIN (NITROSTAT) 0.4 MG SL tablet Place 1 tablet (0.4 mg total) under the tongue  every 5 (five) minutes as needed for chest pain. Reported on 08/04/2015 25 tablet 6  . pantoprazole (PROTONIX) 40 MG tablet Take 40 mg by mouth daily.     . potassium chloride (K-DUR) 10 MEQ tablet Take 1 tablet (10 mEq total) by mouth daily. 180 tablet 0  . sodium chloride (OCEAN) 0.65 % SOLN nasal spray Place 1 spray into both nostrils 2 (two) times daily.     No current facility-administered medications for this visit.     Past Medical History:  Diagnosis Date  . Anxiety   . Arthritis   . Atrial fibrillation ([place])   . Bursitis    Left shoulder  . Cataract   . CHF (congestive heart failure) ([place])   . COPD (chronic obstructive pulmonary disease) ([place])   .  Coronary atherosclerosis of native coronary artery    a. s/p DES to LCx in 04/2013 b. cath in 11/2015 showing patent stent with 20% prox-LAD and 80% ostial RCA stenosis for which medical management was recommended due to small artery size  . Depression   . Diastolic heart failure (H[facility])    EF 55-60%  . Dysphagia, unspecified(787.20)   . Dyspnea   . Dysrhythmia   . Essential hypertension   . GERD (gastroesophageal reflux disease)    Hx Schatzki's ring, multiple EGD/ED last 01/06/2004  . Gout   . Headache   . History of anemia   . Hyperlipidemia   . Internal hemorrhoids without mention of complication   . MI (myocardial infarction) ([place]) 2006  . Microscopic colitis 2003  . Panic disorder without agoraphobia   . Paresthesia   . Pneumonia 12/2011  . PVD (peripheral vascular disease) ([place])   . S/P colonoscopy 09/27/2001   internal hemorrhoids, desc colon inflam polyp, SB BX-chronic duodenitis, colitis  . Thyroid disease     ROS:   All systems reviewed and negative except as noted in the HPI.   Past Surgical History:  Procedure Laterality Date  . ABDOMINAL HYSTERECTOMY    . ABDOMINAL HYSTERECTOMY    . ANTERIOR AND POSTERIOR REPAIR     with resection of vagina  . ANTERIOR LAT LUMBAR FUSION N/A 08/01/2016   Procedure: Lumbar Two-Lumbar Five Transpsoas lateral interbody fusion with Lumbar Two-Three lateral plate fixation;  Surgeon: Kevan Ny Ditty, MD;  Location: [facility];  Service: Neurosurgery;  Laterality: N/A;  L2-5 Transpsoas lateral interbody fusion with L2-3 lateral plate fixation  . APPENDECTOMY    . BACK SURGERY    . BIOPSY  07/05/2015   Procedure: BIOPSY;  Surgeon: Daneil Dolin, MD;  [facility];  Service: Endoscopy;;  gastric polyp biopsy, ascending colon biopsy  . BLADDER SUSPENSION  11/09/2011   Procedure: TRANSVAGINAL TAPE (TVT) PROCEDURE;  Surgeon: Marissa Nestle, MD;  Location: [facility];  Service: Urology;  Laterality: N/A;  . bladder tack  06/2010  .  BREAST LUMPECTOMY  1998   left, benign  . CARDIAC CATHETERIZATION    . CARDIAC CATHETERIZATION    . CARDIAC CATHETERIZATION N/A 12/16/2015   Procedure: Left Heart Cath and Coronary Angiography;  Surgeon: Troy Sine, MD;  Location: [facility];  Service: Cardiovascular;  Laterality: N/A;  . CARDIOVERSION N/A 10/04/2017   Procedure: CARDIOVERSION;  Surgeon: Herminio Commons, MD;  Location: [facility];  Service: Cardiovascular;  Laterality: N/A;  . CARDIOVERSION N/A 01/30/2018   Procedure: CARDIOVERSION;  Surgeon: Herminio Commons, MD;  [facility];  Service: Cardiovascular;  Laterality: N/A;  .  [place]   left  . cataract surgery    . CHOLECYSTECTOMY  1998  . Cholecystectomy    . COLONOSCOPY  03/16/2011   multiple hyperplastic colon polyps, sigmoid diverticulosis, melanosis coli  . COLONOSCOPY WITH PROPOFOL N/A 07/05/2015   RMR:one 5 mm polyp in descending colon  . CORONARY ANGIOGRAPHY N/A 05/16/2018   Procedure: CORONARY ANGIOGRAPHY (CATH LAB);  Surgeon: Belva Crome, MD;  Location: [facility];  Service: Cardiovascular;  Laterality: N/A;  . CORONARY ANGIOPLASTY WITH STENT PLACEMENT    . ESOPHAGEAL DILATION N/A 07/05/2015   Procedure: ESOPHAGEAL DILATION;  Surgeon: Daneil Dolin, MD;  [facility];  Service: Endoscopy;  Laterality: N/A;  . ESOPHAGOGASTRODUODENOSCOPY (EGD) WITH PROPOFOL N/A 07/05/2015   [id number]  . JOINT REPLACEMENT Right 2007   right knee  . left hand surgery    . LEFT HEART CATHETERIZATION WITH CORONARY ANGIOGRAM N/A 05/14/2013   Procedure: LEFT HEART CATHETERIZATION WITH CORONARY ANGIOGRAM;  Surgeon: Blane Ohara, MD;  Location: [facility] CATH LAB;  Service: Cardiovascular;  Laterality: N/A;  . left rotator cuff surgery    . LUMBAR LAMINECTOMY/DECOMPRESSION MICRODISCECTOMY N/A 10/11/2012   Procedure: LUMBAR LAMINECTOMY/DECOMPRESSION MICRODISCECTOMY 2 LEVELS;  Surgeon: Floyce Stakes, MD;  Location: [facility]  [facility];  Service: Neurosurgery;  Laterality: N/A;  L3-4 L4-5 Laminectomy  . LUMBAR WOUND DEBRIDEMENT N/A 09/27/2015   Procedure: Exploration of Lumbar Wound w/ Repair CSF Leak/Lumbar Drain Placement;  Surgeon: Leeroy Cha, MD;  Location: [facility];  Service: Neurosurgery;  Laterality: N/A;  . MALONEY DILATION  03/16/2011   Gastritis. No H.pylori on bx. 22F maloney dilation with disruption of  occult cevical esophageal web  . NASAL SINUS SURGERY    . right knee replacement  2007  . right leg benign tumor    . SHOULDER SURGERY Left   . TEE WITHOUT CARDIOVERSION N/A 10/04/2017   Procedure: TRANSESOPHAGEAL ECHOCARDIOGRAM (TEE) WITH PROPOFOL;  Surgeon: Herminio Commons, MD;  Location: [facility];  Service: Cardiovascular;  Laterality: N/A;  . TONSILLECTOMY    . unspecified area, hysterectomy  1972   partial     Family History  Problem Relation Age of Onset  . Stroke Mother   . Parkinson's disease Father   . Coronary artery disease Other        family Hx-sons  . Cancer Other   . Stroke Other        family Hx  . Hypertension Other        family Hx  . Diabetes Brother   . Heart disease Son        before age 23  . Diabetes Son   . Stroke Daughter 58  . Colon cancer Neg Hx      Social History   Socioeconomic History  . Marital status: Divorced    Spouse name: Not on file  . Number of children: 5  . Years of education: Not on file  . Highest education level: Not on file  Occupational History  . Occupation: retired  Tobacco Use  . Smoking status: Former Smoker    Packs/day: 1.00    Years: 64.00    Pack years: 64.00    Types: Cigarettes    Start date: 12/24/1947    Quit date: 11/17/2001    Years since quitting: 17.5  . Smokeless tobacco: Never Used  . Tobacco comment: Quit smoking in 2003  Substance and Sexual Activity  . Alcohol use: No  Alcohol/week: 0.0 standard drinks  . Drug use: No  . Sexual activity: Never  Other Topics Concern  . Not on file  Social History  Narrative   Divorced.   Sister had colon perforation & died from complications in [place], [place]   Social Determinants of Health   Financial Resource Strain:   . Difficulty of Paying Living Expenses: Not on file  Food Insecurity:   . Worried About Charity fundraiser in the Last Year: Not on file  . Ran Out of Food in the Last Year: Not on file  Transportation Needs:   . Lack of Transportation (Medical): Not on file  . Lack of Transportation (Non-Medical): Not on file  Physical Activity:   . Days of Exercise per Week: Not on file  . Minutes of Exercise per Session: Not on file  Stress:   . Feeling of Stress : Not on file  Social Connections:   . Frequency of Communication with Friends and Family: Not on file  . Frequency of Social Gatherings with Friends and Family: Not on file  . Attends Religious Services: Not on file  . Active Member of Clubs or Organizations: Not on file  . Attends Archivist Meetings: Not on file  . Marital Status: Not on file  Intimate Partner Violence:   . Fear of Current or Ex-Partner: Not on file  . Emotionally Abused: Not on file  . Physically Abused: Not on file  . Sexually Abused: Not on file     BP (!) 173/60   Pulse 76   Temp (!) 97.5 F (36.4 C)   Ht 5\' 1"  (1.549 m)   Wt 169 lb (76.7 kg)   SpO2 95%   BMI 31.93 kg/m   Physical Exam:  Well appearing NAD HEENT: Unremarkable Neck:  No JVD, no thyromegally Lymphatics:  No adenopathy Back:  No CVA tenderness Lungs:  Clear with no wheezes HEART:  Regular rate rhythm, no murmurs, no rubs, no clicks Abd:  soft, positive bowel sounds, no organomegally, no rebound, no guarding Ext:  2 plus pulses, no edema, no cyanosis, no clubbing Skin:  No rashes no nodules Neuro:  CN II through XII intact, motor grossly intact  DEVICE  Normal device function.  See PaceArt for details.   Assess/Plan: 1. Chronic diastolic heart failure - she has gained some weight and has not been taking her  lasix. I asked her to go back to lasix 40 mg daily until her weight at home goes below 165.  2. NSVT - her cardiac monitor whas reassuring. I would have her continue her toprol. 3. orthostasis - she has only had a single episode. We will follow. I would not hold her Eliquis but if she continues to pass out would suggest this.  Mikle [place]worth.D

## 2019-06-18 ENCOUNTER — Ambulatory Visit: Payer: Medicare HMO | Admitting: Internal Medicine

## 2019-07-07 ENCOUNTER — Telehealth: Payer: Self-pay | Admitting: Internal Medicine

## 2019-07-07 ENCOUNTER — Other Ambulatory Visit: Payer: Self-pay | Admitting: Cardiovascular Disease

## 2019-07-07 NOTE — Telephone Encounter (Signed)
Pt needs medication of Eliquis, pharmacy stated that she took her last pill today. Please address

## 2019-07-07 NOTE — Telephone Encounter (Signed)
New message:   Pharmacy calling stating that the patient do not have insurance anymore and would like to know if there is a coupon for her Eliquis. Please call the pharmacy.

## 2019-07-07 NOTE — Telephone Encounter (Signed)
Pharmacist  at [facility] is going is going to use a 30 day FREE for eliquis

## 2019-07-14 ENCOUNTER — Other Ambulatory Visit: Payer: Self-pay | Admitting: Internal Medicine

## 2019-07-14 MED ORDER — APIXABAN 5 MG PO TABS: 5.0000 mg | ORAL_TABLET | Freq: Two times a day (BID) | ORAL | 3 refills | Status: AC

## 2019-07-14 NOTE — Telephone Encounter (Signed)
Refill sent.

## 2019-07-14 NOTE — Telephone Encounter (Signed)
ELIQUIS 5 MG TABS tablet  Needs Refill - [facility] -[place], [place]

## 2019-09-15 DIAGNOSIS — I5033 Acute on chronic diastolic (congestive) heart failure: Secondary | ICD-10-CM | POA: Diagnosis not present

## 2019-09-15 DIAGNOSIS — J440 Chronic obstructive pulmonary disease with acute lower respiratory infection: Secondary | ICD-10-CM | POA: Diagnosis not present

## 2019-09-15 DIAGNOSIS — I130 Hypertensive heart and chronic kidney disease with heart failure and stage 1 through stage 4 chronic kidney disease, or unspecified chronic kidney disease: Secondary | ICD-10-CM | POA: Diagnosis not present

## 2019-09-15 DIAGNOSIS — Z87891 Personal history of nicotine dependence: Secondary | ICD-10-CM | POA: Diagnosis not present

## 2019-09-15 DIAGNOSIS — N1832 Chronic kidney disease, stage 3b: Secondary | ICD-10-CM | POA: Diagnosis not present

## 2019-09-19 ENCOUNTER — Encounter (HOSPITAL_COMMUNITY): Payer: Self-pay | Admitting: *Deleted

## 2019-09-19 ENCOUNTER — Emergency Department (HOSPITAL_COMMUNITY): Payer: Medicare HMO

## 2019-09-19 ENCOUNTER — Inpatient Hospital Stay (HOSPITAL_COMMUNITY)
Admission: EM | Admit: 2019-09-19 | Discharge: 2019-09-23 | DRG: 291 | Disposition: A | Discharge status: Home Health | Payer: Medicare HMO | Attending: Internal Medicine | Admitting: Internal Medicine

## 2019-09-19 ENCOUNTER — Other Ambulatory Visit: Payer: Self-pay

## 2019-09-19 DIAGNOSIS — R69 Illness, unspecified: Secondary | ICD-10-CM | POA: Diagnosis not present

## 2019-09-19 DIAGNOSIS — I5043 Acute on chronic combined systolic (congestive) and diastolic (congestive) heart failure: Secondary | ICD-10-CM | POA: Diagnosis not present

## 2019-09-19 DIAGNOSIS — Z79899 Other long term (current) drug therapy: Secondary | ICD-10-CM

## 2019-09-19 DIAGNOSIS — I10 Essential (primary) hypertension: Secondary | ICD-10-CM | POA: Diagnosis not present

## 2019-09-19 DIAGNOSIS — F329 Major depressive disorder, single episode, unspecified: Secondary | ICD-10-CM | POA: Diagnosis present

## 2019-09-19 DIAGNOSIS — I130 Hypertensive heart and chronic kidney disease with heart failure and stage 1 through stage 4 chronic kidney disease, or unspecified chronic kidney disease: Secondary | ICD-10-CM | POA: Diagnosis not present

## 2019-09-19 DIAGNOSIS — G3184 Mild cognitive impairment, so stated: Secondary | ICD-10-CM | POA: Diagnosis present

## 2019-09-19 DIAGNOSIS — E039 Hypothyroidism, unspecified: Secondary | ICD-10-CM | POA: Diagnosis present

## 2019-09-19 DIAGNOSIS — Z90710 Acquired absence of both cervix and uterus: Secondary | ICD-10-CM

## 2019-09-19 DIAGNOSIS — I779 Disorder of arteries and arterioles, unspecified: Secondary | ICD-10-CM | POA: Diagnosis present

## 2019-09-19 DIAGNOSIS — Z87891 Personal history of nicotine dependence: Secondary | ICD-10-CM

## 2019-09-19 DIAGNOSIS — N183 Chronic kidney disease, stage 3 unspecified: Secondary | ICD-10-CM | POA: Diagnosis not present

## 2019-09-19 DIAGNOSIS — I2510 Atherosclerotic heart disease of native coronary artery without angina pectoris: Secondary | ICD-10-CM | POA: Diagnosis present

## 2019-09-19 DIAGNOSIS — R0602 Shortness of breath: Secondary | ICD-10-CM | POA: Diagnosis not present

## 2019-09-19 DIAGNOSIS — Z820 Family history of epilepsy and other diseases of the nervous system: Secondary | ICD-10-CM

## 2019-09-19 DIAGNOSIS — Z79890 Hormone replacement therapy (postmenopausal): Secondary | ICD-10-CM

## 2019-09-19 DIAGNOSIS — K219 Gastro-esophageal reflux disease without esophagitis: Secondary | ICD-10-CM | POA: Diagnosis present

## 2019-09-19 DIAGNOSIS — R079 Chest pain, unspecified: Secondary | ICD-10-CM | POA: Diagnosis not present

## 2019-09-19 DIAGNOSIS — I4891 Unspecified atrial fibrillation: Secondary | ICD-10-CM | POA: Diagnosis not present

## 2019-09-19 DIAGNOSIS — N1832 Chronic kidney disease, stage 3b: Secondary | ICD-10-CM | POA: Diagnosis present

## 2019-09-19 DIAGNOSIS — R0789 Other chest pain: Secondary | ICD-10-CM | POA: Diagnosis not present

## 2019-09-19 DIAGNOSIS — I5033 Acute on chronic diastolic (congestive) heart failure: Secondary | ICD-10-CM | POA: Diagnosis present

## 2019-09-19 DIAGNOSIS — Z20822 Contact with and (suspected) exposure to covid-19: Secondary | ICD-10-CM | POA: Diagnosis present

## 2019-09-19 DIAGNOSIS — I739 Peripheral vascular disease, unspecified: Secondary | ICD-10-CM | POA: Diagnosis present

## 2019-09-19 DIAGNOSIS — M436 Torticollis: Secondary | ICD-10-CM | POA: Diagnosis present

## 2019-09-19 DIAGNOSIS — Z7901 Long term (current) use of anticoagulants: Secondary | ICD-10-CM

## 2019-09-19 DIAGNOSIS — E875 Hyperkalemia: Secondary | ICD-10-CM | POA: Diagnosis present

## 2019-09-19 DIAGNOSIS — J441 Chronic obstructive pulmonary disease with (acute) exacerbation: Secondary | ICD-10-CM | POA: Diagnosis present

## 2019-09-19 DIAGNOSIS — Z955 Presence of coronary angioplasty implant and graft: Secondary | ICD-10-CM

## 2019-09-19 DIAGNOSIS — F410 Panic disorder [episodic paroxysmal anxiety] without agoraphobia: Secondary | ICD-10-CM | POA: Diagnosis present

## 2019-09-19 DIAGNOSIS — I4819 Other persistent atrial fibrillation: Secondary | ICD-10-CM | POA: Diagnosis present

## 2019-09-19 DIAGNOSIS — M109 Gout, unspecified: Secondary | ICD-10-CM | POA: Diagnosis present

## 2019-09-19 DIAGNOSIS — R0902 Hypoxemia: Secondary | ICD-10-CM | POA: Diagnosis present

## 2019-09-19 DIAGNOSIS — Z8249 Family history of ischemic heart disease and other diseases of the circulatory system: Secondary | ICD-10-CM

## 2019-09-19 DIAGNOSIS — Z833 Family history of diabetes mellitus: Secondary | ICD-10-CM

## 2019-09-19 DIAGNOSIS — Z823 Family history of stroke: Secondary | ICD-10-CM

## 2019-09-19 DIAGNOSIS — I110 Hypertensive heart disease with heart failure: Secondary | ICD-10-CM | POA: Diagnosis not present

## 2019-09-19 DIAGNOSIS — E785 Hyperlipidemia, unspecified: Secondary | ICD-10-CM | POA: Diagnosis present

## 2019-09-19 DIAGNOSIS — I252 Old myocardial infarction: Secondary | ICD-10-CM

## 2019-09-19 LAB — COMPREHENSIVE METABOLIC PANEL
ALT: 13 U/L (ref 0–44)
AST: 20 U/L (ref 15–41)
Albumin: 3.6 g/dL (ref 3.5–5.0)
Alkaline Phosphatase: 80 U/L (ref 38–126)
Anion gap: 11 (ref 5–15)
BUN: 19 mg/dL (ref 8–23)
CO2: 22 mmol/L (ref 22–32)
Calcium: 8.7 mg/dL — ABNORMAL LOW (ref 8.9–10.3)
Chloride: 102 mmol/L (ref 98–111)
Creatinine, Ser: 1.36 mg/dL — ABNORMAL HIGH (ref 0.44–1.00)
GFR calc Af Amer: 41 mL/min — ABNORMAL LOW (ref >60)
GFR calc non Af Amer: 36 mL/min — ABNORMAL LOW (ref >60)
Glucose, Bld: 171 mg/dL — ABNORMAL HIGH (ref 70–99)
Potassium: 3.9 mmol/L (ref 3.5–5.1)
Sodium: 135 mmol/L (ref 135–145)
Total Bilirubin: 0.5 mg/dL (ref 0.3–1.2)
Total Protein: 6.9 g/dL (ref 6.5–8.1)

## 2019-09-19 LAB — CBC
HCT: 34.8 % — ABNORMAL LOW (ref 36.0–46.0)
Hemoglobin: 10.9 g/dL — ABNORMAL LOW (ref 12.0–15.0)
MCH: 27.5 pg (ref 26.0–34.0)
MCHC: 31.3 g/dL (ref 30.0–36.0)
MCV: 87.7 fL (ref 80.0–100.0)
Platelets: 328 10*3/uL (ref 150–400)
RBC: 3.97 MIL/uL (ref 3.87–5.11)
RDW: 13.5 % (ref 11.5–15.5)
WBC: 8.9 10*3/uL (ref 4.0–10.5)
nRBC: 0.3 % — ABNORMAL HIGH (ref 0.0–0.2)

## 2019-09-19 LAB — TROPONIN I (HIGH SENSITIVITY)
Troponin I (High Sensitivity): 8 ng/L (ref <18)
Troponin I (High Sensitivity): 8 ng/L (ref <18)

## 2019-09-19 LAB — TSH: TSH: 3.127 u[IU]/mL (ref 0.350–4.500)

## 2019-09-19 LAB — SARS CORONAVIRUS 2 BY RT PCR (HOSPITAL ORDER, PERFORMED IN ~~LOC~~ HOSPITAL LAB): SARS Coronavirus 2: NEGATIVE

## 2019-09-19 LAB — BRAIN NATRIURETIC PEPTIDE: B Natriuretic Peptide: 788 pg/mL — ABNORMAL HIGH (ref 0.0–100.0)

## 2019-09-19 MED ORDER — ISOSORBIDE MONONITRATE ER 60 MG PO TB24
60.0000 mg | ORAL_TABLET | Freq: Every day | ORAL | Status: DC
  Administered 2019-09-20 – 2019-09-23 (×4): 60 mg via ORAL
  Filled 2019-09-19 (×4): qty 1

## 2019-09-19 MED ORDER — FLUTICASONE PROPIONATE 50 MCG/ACT NA SUSP: 2.0000 | Freq: Two times a day (BID) | NASAL | Status: DC | PRN

## 2019-09-19 MED ORDER — LEVOTHYROXINE SODIUM 25 MCG PO TABS
25.0000 ug | ORAL_TABLET | Freq: Every day | ORAL | Status: DC
  Administered 2019-09-20 – 2019-09-23 (×4): 25 ug via ORAL
  Filled 2019-09-19 (×4): qty 1

## 2019-09-19 MED ORDER — POTASSIUM CHLORIDE CRYS ER 20 MEQ PO TBCR
40.0000 meq | EXTENDED_RELEASE_TABLET | Freq: Two times a day (BID) | ORAL | Status: DC
  Administered 2019-09-19 – 2019-09-21 (×5): 40 meq via ORAL
  Filled 2019-09-19 (×6): qty 2

## 2019-09-19 MED ORDER — ALBUTEROL SULFATE (2.5 MG/3ML) 0.083% IN NEBU: 2.5000 mg | INHALATION_SOLUTION | Freq: Four times a day (QID) | RESPIRATORY_TRACT | Status: DC | PRN

## 2019-09-19 MED ORDER — SALINE SPRAY 0.65 % NA SOLN: 1.0000 | NASAL | Status: DC | PRN

## 2019-09-19 MED ORDER — DILTIAZEM HCL-DEXTROSE 125-5 MG/125ML-% IV SOLN (PREMIX)
5.0000 mg/h | INTRAVENOUS | Status: DC
  Administered 2019-09-19: 5 mg/h via INTRAVENOUS
  Filled 2019-09-19: qty 125

## 2019-09-19 MED ORDER — ALPRAZOLAM 0.25 MG PO TABS
0.2500 mg | ORAL_TABLET | Freq: Two times a day (BID) | ORAL | Status: DC | PRN
  Administered 2019-09-19 – 2019-09-22 (×5): 0.25 mg via ORAL
  Filled 2019-09-19 (×5): qty 1

## 2019-09-19 MED ORDER — SODIUM CHLORIDE 0.9% FLUSH: 3.0000 mL | INTRAVENOUS | Status: DC | PRN

## 2019-09-19 MED ORDER — FUROSEMIDE 10 MG/ML IJ SOLN
20.0000 mg | Freq: Once | INTRAMUSCULAR | Status: AC
  Administered 2019-09-19: 20 mg via INTRAVENOUS
  Filled 2019-09-19: qty 2

## 2019-09-19 MED ORDER — DILTIAZEM LOAD VIA INFUSION
10.0000 mg | Freq: Once | INTRAVENOUS | Status: AC
  Administered 2019-09-19: 10 mg via INTRAVENOUS
  Filled 2019-09-19: qty 10

## 2019-09-19 MED ORDER — SODIUM CHLORIDE 0.9% FLUSH
3.0000 mL | Freq: Two times a day (BID) | INTRAVENOUS | Status: DC
  Administered 2019-09-19 – 2019-09-23 (×8): 3 mL via INTRAVENOUS

## 2019-09-19 MED ORDER — METOPROLOL SUCCINATE ER 25 MG PO TB24
50.0000 mg | ORAL_TABLET | Freq: Once | ORAL | Status: AC
  Administered 2019-09-19: 50 mg via ORAL
  Filled 2019-09-19: qty 2

## 2019-09-19 MED ORDER — FUROSEMIDE 10 MG/ML IJ SOLN
40.0000 mg | Freq: Two times a day (BID) | INTRAMUSCULAR | Status: DC
  Administered 2019-09-19: 40 mg via INTRAVENOUS
  Filled 2019-09-19: qty 4

## 2019-09-19 MED ORDER — ONDANSETRON HCL 4 MG/2ML IJ SOLN
4.0000 mg | Freq: Once | INTRAMUSCULAR | Status: AC
  Administered 2019-09-19: 4 mg via INTRAVENOUS
  Filled 2019-09-19: qty 2

## 2019-09-19 MED ORDER — APIXABAN 5 MG PO TABS
5.0000 mg | ORAL_TABLET | Freq: Two times a day (BID) | ORAL | Status: DC
  Administered 2019-09-19 – 2019-09-20 (×3): 5 mg via ORAL
  Filled 2019-09-19 (×3): qty 1

## 2019-09-19 MED ORDER — DULOXETINE HCL 60 MG PO CPEP
60.0000 mg | ORAL_CAPSULE | Freq: Every day | ORAL | Status: DC
  Administered 2019-09-19 – 2019-09-22 (×4): 60 mg via ORAL
  Filled 2019-09-19 (×4): qty 1

## 2019-09-19 MED ORDER — METHOCARBAMOL 500 MG PO TABS
500.0000 mg | ORAL_TABLET | Freq: Four times a day (QID) | ORAL | Status: DC | PRN
  Administered 2019-09-19: 500 mg via ORAL
  Filled 2019-09-19: qty 1

## 2019-09-19 MED ORDER — MORPHINE SULFATE (PF) 2 MG/ML IV SOLN
2.0000 mg | Freq: Once | INTRAVENOUS | Status: AC
  Administered 2019-09-19: 2 mg via INTRAVENOUS
  Filled 2019-09-19: qty 1

## 2019-09-19 MED ORDER — ONDANSETRON HCL 4 MG/2ML IJ SOLN: 4.0000 mg | Freq: Four times a day (QID) | INTRAMUSCULAR | Status: DC | PRN

## 2019-09-19 MED ORDER — MAGNESIUM OXIDE 400 (241.3 MG) MG PO TABS
400.0000 mg | ORAL_TABLET | Freq: Every day | ORAL | Status: DC
  Administered 2019-09-19 – 2019-09-23 (×5): 400 mg via ORAL
  Filled 2019-09-19 (×5): qty 1

## 2019-09-19 MED ORDER — PANTOPRAZOLE SODIUM 40 MG PO TBEC
40.0000 mg | DELAYED_RELEASE_TABLET | Freq: Every day | ORAL | Status: DC
  Administered 2019-09-20 – 2019-09-23 (×4): 40 mg via ORAL
  Filled 2019-09-19 (×4): qty 1

## 2019-09-19 MED ORDER — ACETAMINOPHEN 325 MG PO TABS
650.0000 mg | ORAL_TABLET | ORAL | Status: DC | PRN
  Administered 2019-09-21 – 2019-09-22 (×2): 650 mg via ORAL
  Filled 2019-09-19 (×2): qty 2

## 2019-09-19 MED ORDER — DILTIAZEM HCL-DEXTROSE 125-5 MG/125ML-% IV SOLN (PREMIX)
5.0000 mg/h | INTRAVENOUS | Status: DC
  Administered 2019-09-19: 5 mg/h via INTRAVENOUS

## 2019-09-19 MED ORDER — SODIUM CHLORIDE 0.9 % IV SOLN: 250.0000 mL | INTRAVENOUS | Status: DC | PRN

## 2019-09-19 MED ORDER — IPRATROPIUM-ALBUTEROL 0.5-2.5 (3) MG/3ML IN SOLN
3.0000 mL | Freq: Two times a day (BID) | RESPIRATORY_TRACT | Status: DC
  Administered 2019-09-20 – 2019-09-23 (×7): 3 mL via RESPIRATORY_TRACT
  Filled 2019-09-19 (×7): qty 3

## 2019-09-19 MED ORDER — IPRATROPIUM-ALBUTEROL 0.5-2.5 (3) MG/3ML IN SOLN
3.0000 mL | Freq: Four times a day (QID) | RESPIRATORY_TRACT | Status: DC
  Administered 2019-09-19: 3 mL via RESPIRATORY_TRACT
  Filled 2019-09-19: qty 3

## 2019-09-19 NOTE — ED Notes (Signed)
Attempted report x1. 

## 2019-09-19 NOTE — ED Notes (Signed)
ED TO INPATIENT HANDOFF REPORT  ED Nurse Name and Phone #:   S Name/Age/Gender [name] 86 y.o. female Room/Bed: APA14/APA14  Code Status   Code Status: Prior  Home/SNF/Other Home Patient oriented to: self, place, time and situation Is this baseline? Yes   Triage Complete: Triage complete  Chief Complaint Atrial fibrillation with RVR ([place]) [I48.91]  Triage Note Chest pain with shortness of breath onset yesterday    Allergies Allergies  Allergen Reactions  . Cephalosporins Diarrhea and Nausea Only    Lightheaded  . Levaquin [Levofloxacin In D5w] Swelling  . Macrodantin [Nitrofurantoin Macrocrystal] Swelling  . Phenothiazines Anaphylaxis and Hives  . Polysorbate Anaphylaxis  . Prednisone Shortness Of Breath  . Buspirone Itching  . Cardura [Doxazosin Mesylate] Itching  . Codeine Itching  . Acyclovir And Related Itching    Redness of skin  . Colcrys [Colchicine] Nausea Only  . Prochlorperazine Other (See Comments)    "Upset stomach"  . Ranexa [Ranolazine]     Severe drop in BP  . Atorvastatin Hives    Cramping; tolerates Crestor ok  . Ofloxacin Rash  . Other Itching and Rash    "WOOL"= make skin look like it has been burned  . Penicillins Other (See Comments)    Causes redness all over. Has patient had a PCN reaction causing immediate rash, facial/tongue/throat swelling, SOB or lightheadedness with hypotension: No Has patient had a PCN reaction causing severe rash involving mucus membranes or skin necrosis: No Has patient had a PCN reaction that required hospitalization No Has patient had a PCN reaction occurring within the last 10 years: No If all of the above answers are "NO", then may proceed with Cephalosporin use.   . Pimozide Hives and Itching    Level of Care/Admitting Diagnosis ED Disposition    ED Disposition Condition [place] [facility] Area: [facility] [[id number]]  Level of Care: Stepdown [14]  Covid Evaluation:  Asymptomatic Screening Protocol (No Symptoms)  Diagnosis: Atrial fibrillation with RVR [facility]) [[id number]]  Admitting Physician: Truett Mainland [[id number]]  Attending Physician: Truett Mainland [[id number]]       B Medical/Surgery History Past Medical History:  Diagnosis Date  . Anxiety   . Arthritis   . Atrial fibrillation ([place])   . Bursitis    Left shoulder  . Cataract   . CHF (congestive heart failure) ([place])   . COPD (chronic obstructive pulmonary disease) ([place])   . Coronary atherosclerosis of native coronary artery    a. s/p DES to LCx in 04/2013 b. cath in 11/2015 showing patent stent with 20% prox-LAD and 80% ostial RCA stenosis for which medical management was recommended due to small artery size  . Depression   . Diastolic heart failure (HCC)    EF 55-60%  . Dysphagia, unspecified(787.20)   . Dyspnea   . Dysrhythmia   . Essential hypertension   . GERD (gastroesophageal reflux disease)    Hx Schatzki's ring, multiple EGD/ED last 01/06/2004  . Gout   . Headache   . History of anemia   . Hyperlipidemia   . Internal hemorrhoids without mention of complication   . MI (myocardial infarction) ([place]) 2006  . Microscopic colitis 2003  . Panic disorder without agoraphobia   . Paresthesia   . Pneumonia 12/2011  . PVD (peripheral vascular disease) ([place])   . S/P colonoscopy 09/27/2001   internal hemorrhoids, desc colon inflam polyp, SB BX-chronic duodenitis, colitis  . Thyroid disease    Past  Surgical History:  Procedure Laterality Date  . ABDOMINAL HYSTERECTOMY    . ABDOMINAL HYSTERECTOMY    . ANTERIOR AND POSTERIOR REPAIR     with resection of vagina  . ANTERIOR LAT LUMBAR FUSION N/A 08/01/2016   Procedure: Lumbar Two-Lumbar Five Transpsoas lateral interbody fusion with Lumbar Two-Three lateral plate fixation;  Surgeon: Kevan Ny Ditty, MD;  Location: [facility];  Service: Neurosurgery;  Laterality: N/A;  L2-5 Transpsoas lateral interbody fusion with L2-3 lateral plate fixation  .  APPENDECTOMY    . BACK SURGERY    . BIOPSY  07/05/2015   Procedure: BIOPSY;  Surgeon: Daneil Dolin, MD;  [facility];  Service: Endoscopy;;  gastric polyp biopsy, ascending colon biopsy  . BLADDER SUSPENSION  11/09/2011   Procedure: TRANSVAGINAL TAPE (TVT) PROCEDURE;  Surgeon: Marissa Nestle, MD;  Location: [facility];  Service: Urology;  Laterality: N/A;  . bladder tack  06/2010  . BREAST LUMPECTOMY  1998   left, benign  . CARDIAC CATHETERIZATION    . CARDIAC CATHETERIZATION    . CARDIAC CATHETERIZATION N/A 12/16/2015   Procedure: Left Heart Cath and Coronary Angiography;  Surgeon: Troy Sine, MD;  Location: [facility] CV LAB;  Service: Cardiovascular;  Laterality: N/A;  . CARDIOVERSION N/A 10/04/2017   Procedure: CARDIOVERSION;  Surgeon: Herminio Commons, MD;  Location: [facility];  Service: Cardiovascular;  Laterality: N/A;  . CARDIOVERSION N/A 01/30/2018   Procedure: CARDIOVERSION;  Surgeon: Herminio Commons, MD;  [facility];  Service: Cardiovascular;  Laterality: N/A;  . [place]   left  . cataract surgery    . CHOLECYSTECTOMY  1998  . Cholecystectomy    . COLONOSCOPY  03/16/2011   multiple hyperplastic colon polyps, sigmoid diverticulosis, melanosis coli  . COLONOSCOPY WITH PROPOFOL N/A 07/05/2015   RMR:one 5 mm polyp in descending colon  . CORONARY ANGIOGRAPHY N/A 05/16/2018   Procedure: CORONARY ANGIOGRAPHY (CATH LAB);  Surgeon: Belva Crome, MD;  Location: [facility];  Service: Cardiovascular;  Laterality: N/A;  . CORONARY ANGIOPLASTY WITH STENT PLACEMENT    . ESOPHAGEAL DILATION N/A 07/05/2015   Procedure: ESOPHAGEAL DILATION;  Surgeon: Daneil Dolin, MD;  [facility];  Service: Endoscopy;  Laterality: N/A;  . ESOPHAGOGASTRODUODENOSCOPY (EGD) WITH PROPOFOL N/A 07/05/2015   CHY:IFOYDX  . JOINT REPLACEMENT Right 2007   right knee  . left hand surgery    . LEFT HEART CATHETERIZATION WITH CORONARY ANGIOGRAM  N/A 05/14/2013   Procedure: LEFT HEART CATHETERIZATION WITH CORONARY ANGIOGRAM;  Surgeon: Blane Ohara, MD;  Location: [facility] CATH LAB;  Service: Cardiovascular;  Laterality: N/A;  . left rotator cuff surgery    . LUMBAR LAMINECTOMY/DECOMPRESSION MICRODISCECTOMY N/A 10/11/2012   Procedure: LUMBAR LAMINECTOMY/DECOMPRESSION MICRODISCECTOMY 2 LEVELS;  Surgeon: Floyce Stakes, MD;  Location: [facility] ORS;  Service: Neurosurgery;  Laterality: N/A;  L3-4 L4-5 Laminectomy  . LUMBAR WOUND DEBRIDEMENT N/A 09/27/2015   Procedure: Exploration of Lumbar Wound w/ Repair CSF Leak/Lumbar Drain Placement;  Surgeon: Leeroy Cha, MD;  Location: [place] [facility] NEURO ORS;  Service: Neurosurgery;  Laterality: N/A;  . MALONEY DILATION  03/16/2011   Gastritis. No H.pylori on bx. 16F maloney dilation with disruption of  occult cevical esophageal web  . NASAL SINUS SURGERY    . right knee replacement  2007  . right leg benign tumor    . SHOULDER SURGERY Left   . TEE WITHOUT CARDIOVERSION N/A 10/04/2017   Procedure: TRANSESOPHAGEAL ECHOCARDIOGRAM (  TEE) WITH PROPOFOL;  Surgeon: Herminio Commons, MD;  Location: [facility];  Service: Cardiovascular;  Laterality: N/A;  . TONSILLECTOMY    . unspecified area, hysterectomy  1972   partial     A IV Location/Drains/Wounds Patient Lines/Drains/Airways Status   Active Line/Drains/Airways    Name:   Placement date:   Placement time:   Site:   Days:   Peripheral IV 09/19/19 Right Hand   09/19/19    1351    Hand   less than 1          Intake/Output Last 24 hours No intake or output data in the 24 hours ending 09/19/19 1802  Labs/Imaging Results for orders placed or performed during the hospital encounter of 09/19/19 (from the past 48 hour(s))  CBC     Status: Abnormal   Collection Time: 09/19/19  1:15 PM  Result Value Ref Range   WBC 8.9 4.0 - 10.5 K/uL   RBC 3.97 3.87 - 5.11 MIL/uL   Hemoglobin 10.9 (L) 12.0 - 15.0 g/dL   HCT 34.8 (L) 36.0 - 46.0 %   MCV 87.7 80.0 - 100.0  fL   MCH 27.5 26.0 - 34.0 pg   MCHC 31.3 30.0 - 36.0 g/dL   RDW 13.5 11.5 - 15.5 %   Platelets 328 150 - 400 K/uL   nRBC 0.3 (H) 0.0 - 0.2 %    Comment: Performed at [facility], [address] [place], [place] [address]  Comprehensive metabolic panel     Status: Abnormal   Collection Time: 09/19/19  1:15 PM  Result Value Ref Range   Sodium 135 135 - 145 mmol/L   Potassium 3.9 3.5 - 5.1 mmol/L   Chloride 102 98 - 111 mmol/L   CO2 22 22 - 32 mmol/L   Glucose, Bld 171 (H) 70 - 99 mg/dL    Comment: Glucose reference range applies only to samples taken after fasting for at least 8 hours.   BUN 19 8 - 23 mg/dL   Creatinine, Ser 1.36 (H) 0.44 - 1.00 mg/dL   Calcium 8.7 (L) 8.9 - 10.3 mg/dL   Total Protein 6.9 6.5 - 8.1 g/dL   Albumin 3.6 3.5 - 5.0 g/dL   AST 20 15 - 41 U/L   ALT 13 0 - 44 U/L   Alkaline Phosphatase 80 38 - 126 U/L   Total Bilirubin 0.5 0.3 - 1.2 mg/dL   GFR calc non Af Amer 36 (L) >60 mL/min   GFR calc Af Amer 41 (L) >60 mL/min   Anion gap 11 5 - 15    Comment: Performed at [facility], [address], [place], [place] [address]  Brain natriuretic peptide     Status: Abnormal   Collection Time: 09/19/19  1:15 PM  Result Value Ref Range   B Natriuretic Peptide 788.0 (H) 0.0 - 100.0 pg/mL    Comment: Performed at [facility], [address] [place], [place] [address]  Troponin I (High Sensitivity)     Status: None   Collection Time: 09/19/19  1:15 PM  Result Value Ref Range   Troponin I (High Sensitivity) 8 <18 ng/L    Comment: (NOTE) Elevated high sensitivity troponin I (hsTnI) values and significant  changes across serial measurements may suggest ACS but many other  chronic and acute conditions are known to elevate hsTnI results.  Refer to the "Links" section for chest pain algorithms and additional  guidance. Performed at [facility], [address], [place],  [place] [address]   Troponin I (High Sensitivity)     Status: None   Collection Time: 09/19/19   2:52 PM  Result Value Ref Range   Troponin I (High Sensitivity) 8 <18 ng/L    Comment: (NOTE) Elevated high sensitivity troponin I (hsTnI) values and significant  changes across serial measurements may suggest ACS but many other  chronic and acute conditions are known to elevate hsTnI results.  Refer to the "Links" section for chest pain algorithms and additional  guidance. Performed at [facility], [address], [place], [place] [address]    DG Chest Port 1 View  Result Date: 09/19/2019 CLINICAL DATA:  Shortness of breath.  Chest pain. EXAM: PORTABLE CHEST 1 VIEW COMPARISON:  Chest x-ray 08/11/2019, 10/01/2018. Chest CT 05/07/2019 FINDINGS: Mediastinum and hilar structures normal. Heart size normal. Mild interstitial prominence particularly in the right base again noted. Superimposed mild right base alveolar infiltrate cannot be excluded. No pleural effusion or pneumothorax. IMPRESSION: Mild interstitial prominence particular the right base again noted. Superimposed mild right base alveolar infiltrate cannot be excluded. Electronically Signed   By: Marcello Moores  Register   On: 09/19/2019 13:43    Pending Labs Unresulted Labs (From admission, onward)    Start     Ordered   09/19/19 1747  TSH  Add-on,   AD     09/19/19 1746   09/19/19 1718  SARS Coronavirus 2 by RT PCR (hospital order, performed in [place] hospital lab) Nasopharyngeal Nasopharyngeal Swab  (Tier 2 (TAT 2 hrs))  Once,   STAT    Question Answer Comment  Is this test for diagnosis or screening Screening   Symptomatic for COVID-19 as defined by CDC No   Hospitalized for COVID-19 No   Admitted to ICU for COVID-19 No   Previously tested for COVID-19 Yes   Resident in a congregate (group) care setting No   Employed in healthcare setting No   Pregnant No   Has patient completed COVID vaccination(s) (2 doses of Pfizer/Moderna 1 dose of [employer]) Unknown      09/19/19 1717   Signed and Held  Basic metabolic panel   Tomorrow morning,   R     Signed and Held   Signed and Held  CBC  Tomorrow morning,   R     Signed and Held          Vitals/Pain Today's Vitals   09/19/19 1654 09/19/19 1700 09/19/19 1730 09/19/19 1800  BP: 113/66 127/73 130/69 102/75  Pulse: 81 64 68 (!) 35  Resp:  16 16 (!) 22  Temp:      TempSrc:      SpO2:  100% 100% 100%  Weight:      Height:      PainSc:        Isolation Precautions No active isolations  Medications Medications  diltiazem (CARDIZEM) 1 mg/mL load via infusion 10 mg (10 mg Intravenous Bolus from Bag 09/19/19 1407)    And  diltiazem (CARDIZEM) 125 mg in dextrose 5% 125 mL (1 mg/mL) infusion (5 mg/hr Intravenous New Bag/Given 09/19/19 1414)  morphine 2 MG/ML injection 2 mg (2 mg Intravenous Given 09/19/19 1358)  ondansetron (ZOFRAN) injection 4 mg (4 mg Intravenous Given 09/19/19 1349)  furosemide (LASIX) injection 20 mg (20 mg Intravenous Given 09/19/19 1444)  metoprolol succinate (TOPROL-XL) 24 hr tablet 50 mg (50 mg Oral Given 09/19/19 1654)    Mobility walks Moderate fall risk   Focused Assessments  R Recommendations: See Admitting Provider Note  Report given to:   Additional Notes:

## 2019-09-19 NOTE — ED Provider Notes (Signed)
[facility] EMERGENCY DEPARTMENT Provider Note   CSN: [id number] Arrival date & time: 09/19/19  1210     History Chief Complaint  Patient presents with  . Chest Pain  . Shortness of Breath    [name] is a 86 y.o. female.  Patient with hx afib, chf, cad, presents c/o midline, lower sternal area chest discomfort for the past day, along with shortness of breath, and also feels her afib is 'acting up'. Symptoms acute onset, moderate, constant, persistent, non radiating. No pleuritic pain. Denies heartburn. No cough or uri symptoms. No fever or chills. Denies abd pain or nv. No diaphoresis. No leg pain or swelling. States compliant w home meds.  The history is provided by the patient.  Chest Pain Associated symptoms: palpitations and shortness of breath   Associated symptoms: no abdominal pain, no back pain, no cough, no fever, no headache and no vomiting   Shortness of Breath Associated symptoms: chest pain   Associated symptoms: no abdominal pain, no cough, no fever, no headaches, no neck pain, no rash, no sore throat and no vomiting        Past Medical History:  Diagnosis Date  . Anxiety   . Arthritis   . Atrial fibrillation ([place])   . Bursitis    Left shoulder  . Cataract   . CHF (congestive heart failure) ([place])   . COPD (chronic obstructive pulmonary disease) ([place])   . Coronary atherosclerosis of native coronary artery    a. s/p DES to LCx in 04/2013 b. cath in 11/2015 showing patent stent with 20% prox-LAD and 80% ostial RCA stenosis for which medical management was recommended due to small artery size  . Depression   . Diastolic heart failure (HCC)    EF 55-60%  . Dysphagia, unspecified(787.20)   . Dyspnea   . Dysrhythmia   . Essential hypertension   . GERD (gastroesophageal reflux disease)    Hx Schatzki's ring, multiple EGD/ED last 01/06/2004  . Gout   . Headache   . History of anemia   . Hyperlipidemia   . Internal hemorrhoids without mention of  complication   . MI (myocardial infarction) ([place]) 2006  . Microscopic colitis 2003  . Panic disorder without agoraphobia   . Paresthesia   . Pneumonia 12/2011  . PVD (peripheral vascular disease) ([place])   . S/P colonoscopy 09/27/2001   internal hemorrhoids, desc colon inflam polyp, SB BX-chronic duodenitis, colitis  . Thyroid disease     Patient Active Problem List   Diagnosis Date Noted  . AKI (acute kidney injury) ([place]) 10/01/2018  . Persistent atrial fibrillation 12/30/2017  . Chronic combined systolic and diastolic congestive heart failure ([place]) 12/30/2017  . Hyperammonemia ([place]) 02/20/2017  . Myoclonic jerking 02/20/2017  . Atrial fibrillation ([place]) 02/19/2017  . Acute gout 02/17/2017  . Chronic systolic heart failure ([place])   . Chest pain 09/30/2016  . Status post lumbar spine surgery for decompression of spinal cord 08/07/2016  . Lumbosacral spondylosis with radiculopathy 08/01/2016  . Preoperative clearance 07/05/2016  . Cough 05/26/2016  . Weakness 04/26/2016  . Nausea without vomiting 04/26/2016  . Atrial fibrillation with RVR ([place]) 04/15/2016  . DOE (dyspnea on exertion) 03/20/2016  . Hypoxia   . Acute on chronic diastolic congestive heart failure ([place])   . Chronic kidney disease, stage III (moderate) 02/17/2016  . Essential hypertension 02/17/2016  . COPD exacerbation ([place]) 02/17/2016  . Acute bronchitis 12/18/2015  . CAD in native artery   . Chest pain  at rest 10/15/2015  . Chronic low back pain 10/15/2015  . Hyponatremia 10/15/2015  . Hyperglycemia 10/15/2015  . Thrombocytosis ([place]) 10/15/2015  . Atypical chest pain 10/15/2015  . Depression   . Anxiety   . Gastroesophageal reflux disease without esophagitis   . Mild cognitive impairment 10/14/2015  . Iron deficiency anemia due to chronic blood loss   . Meningitis 10/01/2015  . Coronary artery disease due to lipid rich plaque   . NSVT (nonsustained ventricular tachycardia) ([place])   . PAF (paroxysmal atrial  fibrillation) ([place])   . CSF leak 09/27/2015  . Spondylolisthesis of lumbar region 09/24/2015  . Gastric polyp   . History of colonic polyps   . Chronic diarrhea   . Esophageal dysphagia 04/01/2015  . PAOD (peripheral arterial occlusive disease) ([place]) 03/05/2015  . Pain in the chest   . Hyperlipidemia   . Weight gain 08/12/2014  . Anemia 08/07/2014  . Hemorrhoids 08/06/2014  . CHF (congestive heart failure) ([place]) 07/29/2014  . Rectal bleeding 07/06/2014  . Constipation 07/06/2014  . Lower extremity edema 11/24/2013  . Acute kidney failure ([place]) 11/24/2013  . Hypokalemia 11/24/2013  . Other and unspecified angina pectoris 05/14/2013  . Hematochezia 02/13/2011  . Abdominal pain 02/13/2011  . Major depression ([place]) 09/28/2010  . FATIGUE 04/13/2009  . Chronic diastolic heart failure ([place]) 11/30/2008  . DYSPNEA 11/30/2008  . CAD, NATIVE VESSEL - PCI + DES to left circumflex 05/14/13 11/27/2008  . Peripheral vascular disease ([place]) 11/27/2008  . PANIC ATTACK 02/28/2008  . MI 02/28/2008  . Internal hemorrhoids 02/28/2008  . COLITIS 02/28/2008  . Dysphagia 02/28/2008    Past Surgical History:  Procedure Laterality Date  . ABDOMINAL HYSTERECTOMY    . ABDOMINAL HYSTERECTOMY    . ANTERIOR AND POSTERIOR REPAIR     with resection of vagina  . ANTERIOR LAT LUMBAR FUSION N/A 08/01/2016   Procedure: Lumbar Two-Lumbar Five Transpsoas lateral interbody fusion with Lumbar Two-Three lateral plate fixation;  Surgeon: Kevan Ny Ditty, MD;  Location: [facility];  Service: Neurosurgery;  Laterality: N/A;  L2-5 Transpsoas lateral interbody fusion with L2-3 lateral plate fixation  . APPENDECTOMY    . BACK SURGERY    . BIOPSY  07/05/2015   Procedure: BIOPSY;  Surgeon: Daneil Dolin, MD;  Location: [facility] ENDO SU[facility];  Service: Endoscopy;;  gastric polyp biopsy, ascending colon biopsy  . BLADDER SUSPENSION  11/09/2011   Procedure: TRANSVAGINAL TAPE (TVT) PROCEDURE;  Surgeon: Marissa Nestle, MD;   Location: [facility] ORS;  Service: Urology;  Laterality: N/A;  . bladder tack  06/2010  . BREAST LUMPECTOMY  1998   left, benign  . CARDIAC CATHETERIZATION    . CARDIAC CATHETERIZATION    . CARDIAC CATHETERIZATION N/A 12/16/2015   Procedure: Left Heart Cath and Coronary Angiography;  Surgeon: Troy Sine, MD;  Location: [place] [facility] CV LAB;  Service: Cardiovascular;  Laterality: N/A;  . CARDIOVERSION N/A 10/04/2017   Procedure: CARDIOVERSION;  Surgeon: Herminio Commons, MD;  Location: [facility] ORS;  Service: Cardiovascular;  Laterality: N/A;  . CARDIOVERSION N/A 01/30/2018   Procedure: CARDIOVERSION;  Surgeon: Herminio Commons, MD;  [facility];  Service: Cardiovascular;  Laterality: N/A;  . [place]   left  . cataract surgery    . CHOLECYSTECTOMY  1998  . Cholecystectomy    . COLONOSCOPY  03/16/2011   multiple hyperplastic colon polyps, sigmoid diverticulosis, melanosis coli  . COLONOSCOPY WITH PROPOFOL N/A 07/05/2015   RMR:one 5 mm  polyp in descending colon  . CORONARY ANGIOGRAPHY N/A 05/16/2018   Procedure: CORONARY ANGIOGRAPHY (CATH LAB);  Surgeon: Belva Crome, MD;  Location: [place] CV LAB;  Service: Cardiovascular;  Laterality: N/A;  . CORONARY ANGIOPLASTY WITH STENT PLACEMENT    . ESOPHAGEAL DILATION N/A 07/05/2015   Procedure: ESOPHAGEAL DILATION;  Surgeon: Daneil Dolin, MD;  [facility];  Service: Endoscopy;  Laterality: N/A;  . ESOPHAGOGASTRODUODENOSCOPY (EGD) WITH PROPOFOL N/A 07/05/2015   MWN:[id number]  . JOINT REPLACEMENT Right 2007   right knee  . left hand surgery    . LEFT HEART CATHETERIZATION WITH CORONARY ANGIOGRAM N/A 05/14/2013   Procedure: LEFT HEART CATHETERIZATION WITH CORONARY ANGIOGRAM;  Surgeon: Blane Ohara, MD;  Location: [facility] CATH LAB;  Service: Cardiovascular;  Laterality: N/A;  . left rotator cuff surgery    . LUMBAR LAMINECTOMY/DECOMPRESSION MICRODISCECTOMY N/A 10/11/2012   Procedure: LUMBAR  LAMINECTOMY/DECOMPRESSION MICRODISCECTOMY 2 LEVELS;  Surgeon: Floyce Stakes, MD;  Location: [place] NEURO ORS;  Service: Neurosurgery;  Laterality: N/A;  L3-4 L4-5 Laminectomy  . LUMBAR WOUND DEBRIDEMENT N/A 09/27/2015   Procedure: Exploration of Lumbar Wound w/ Repair CSF Leak/Lumbar Drain Placement;  Surgeon: Leeroy Cha, MD;  Location: [place] NEURO ORS;  Service: Neurosurgery;  Laterality: N/A;  . MALONEY DILATION  03/16/2011   Gastritis. No H.pylori on bx. 27F maloney dilation with disruption of  occult cevical esophageal web  . NASAL SINUS SURGERY    . right knee replacement  2007  . right leg benign tumor    . SHOULDER SURGERY Left   . TEE WITHOUT CARDIOVERSION N/A 10/04/2017   Procedure: TRANSESOPHAGEAL ECHOCARDIOGRAM (TEE) WITH PROPOFOL;  Surgeon: Herminio Commons, MD;  Location: [facility];  Service: Cardiovascular;  Laterality: N/A;  . TONSILLECTOMY    . unspecified area, hysterectomy  1972   partial     OB History    Gravida  8   Para  6   Term      Preterm      AB  2   Living  5     SAB      TAB      Ectopic      Multiple      Live Births              Family History  Problem Relation Age of Onset  . Stroke Mother   . Parkinson's disease Father   . Coronary artery disease Other        family Hx-sons  . Cancer Other   . Stroke Other        family Hx  . Hypertension Other        family Hx  . Diabetes Brother   . Heart disease Son        before age 41  . Diabetes Son   . Stroke Daughter 34  . Colon cancer Neg Hx     Social History   Tobacco Use  . Smoking status: Former Smoker    Packs/day: 1.00    Years: 64.00    Pack years: 64.00    Types: Cigarettes    Start date: 12/24/1947    Quit date: 11/17/2001    Years since quitting: 17.8  . Smokeless tobacco: Never Used  . Tobacco comment: Quit smoking in 2003  Substance Use Topics  . Alcohol use: No    Alcohol/week: 0.0 standard drinks  . Drug use: No    Home Medications Prior to Admission  medications  Medication Sig Start Date End Date Taking? Authorizing Provider  acetaminophen (TYLENOL) 500 MG tablet Take 500 mg by mouth every 6 (six) hours as needed for headache.     [provider]  ALPRAZolam Duanne Moron) 0.25 MG tablet Take 0.25 mg by mouth 2 (two) times daily as needed for anxiety.  09/04/16   [provider]  apixaban (ELIQUIS) 5 MG TABS tablet Take 1 tablet (5 mg total) by mouth 2 (two) times daily. 07/14/19   Evans Lance, MD  colchicine 0.6 MG tablet Take 1 tablet (0.6 mg total) by mouth daily. 10/31/18   Marybelle Killings, MD  DULoxetine (CYMBALTA) 60 MG capsule Take 60 mg by mouth at bedtime.     [provider]  fluticasone (FLONASE) 50 MCG/ACT nasal spray Place 2 sprays into both nostrils 2 (two) times daily as needed for allergies.     [provider]  furosemide (LASIX) 40 MG tablet Take 0.5 tablets (20 mg total) by mouth daily. 10/04/18   Lavina Hamman, MD  ipratropium-albuterol (DUONEB) 0.5-2.5 (3) MG/3ML SOLN Take 3 mLs by nebulization every 6 (six) hours. 12/18/15   Regalado, Belkys A, MD  isosorbide mononitrate (IMDUR) 60 MG 24 hr tablet Take 1 tablet (60 mg total) by mouth daily. 02/21/19 05/28/20  Strader, Fransisco Hertz, PA-C  levothyroxine (SYNTHROID, LEVOTHROID) 25 MCG tablet Take 1 tablet by mouth daily. 06/07/18   [provider]  magnesium oxide (MAG-OX) 400 (241.3 Mg) MG tablet TAKE ONE TABLET BY MOUTH DAILY. 06/17/19   Herminio Commons, MD  methocarbamol (ROBAXIN) 500 MG tablet Take 500 mg by mouth every 6 (six) hours as needed for muscle spasms.     [provider]  metoprolol succinate (TOPROL-XL) 50 MG 24 hr tablet Take 1 1/2 tabs by mouth daily 05/30/19   Herminio Commons, MD  Multiple Vitamin (MULTIVITAMIN WITH MINERALS) TABS tablet Take 1 tablet by mouth daily. Centrum     [provider]  nitroGLYCERIN (NITROSTAT) 0.4 MG SL tablet Place 1 tablet (0.4 mg total) under the tongue every 5 (five)  minutes as needed for chest pain. Reported on 08/04/2015 11/20/17   Herminio Commons, MD  pantoprazole (PROTONIX) 40 MG tablet Take 40 mg by mouth daily.     [provider]  potassium chloride (KLOR-CON) 10 MEQ tablet Take 1 tablet (10 mEq total) by mouth daily. 06/17/19   Herminio Commons, MD  sodium chloride (OCEAN) 0.65 % SOLN nasal spray Place 1 spray into both nostrils 2 (two) times daily.    [provider]    Allergies    Cephalosporins, Levaquin [levofloxacin in d5w], Macrodantin [nitrofurantoin macrocrystal], Phenothiazines, Polysorbate, Prednisone, Buspirone, Cardura [doxazosin mesylate], Codeine, Acyclovir and related, Colcrys [colchicine], Prochlorperazine, Ranexa [ranolazine], Atorvastatin, Ofloxacin, Other, Penicillins, and Pimozide  Review of Systems   Review of Systems  Constitutional: Negative for chills and fever.  HENT: Negative for sore throat.   Eyes: Negative for redness.  Respiratory: Positive for shortness of breath. Negative for cough.   Cardiovascular: Positive for chest pain and palpitations. Negative for leg swelling.  Gastrointestinal: Negative for abdominal pain, diarrhea and vomiting.  Genitourinary: Negative for dysuria and flank pain.  Musculoskeletal: Negative for back pain and neck pain.  Skin: Negative for rash.  Neurological: Negative for syncope and headaches.  Hematological: Does not bruise/bleed easily.  Psychiatric/Behavioral: Negative for confusion.    Physical Exam Updated Vital Signs BP 116/78 (BP Location: Left Arm)   Pulse 92   Temp  98.7 F (37.1 C) (Oral)   Resp 20   Ht 1.549 m (5\' 1" )   Wt 75.8 kg   SpO2 99%   BMI 31.55 kg/m   Physical Exam Vitals and nursing note reviewed.  Constitutional:      Appearance: Normal appearance. She is well-developed.  HENT:     Head: Atraumatic.     Nose: Nose normal.     Mouth/Throat:     Mouth: Mucous membranes are moist.  Eyes:     General: No scleral icterus.     Conjunctiva/sclera: Conjunctivae normal.  Neck:     Trachea: No tracheal deviation.  Cardiovascular:     Rate and Rhythm: Tachycardia present. Rhythm irregular.     Pulses: Normal pulses.     Heart sounds: Normal heart sounds. No murmur. No friction rub. No gallop.   Pulmonary:     Effort: Pulmonary effort is normal. No respiratory distress.     Breath sounds: Normal breath sounds.  Abdominal:     General: Bowel sounds are normal. There is no distension.     Palpations: Abdomen is soft.     Tenderness: There is no abdominal tenderness. There is no guarding.  Genitourinary:    Comments: No cva tenderness.  Musculoskeletal:        General: No swelling.     Cervical back: Normal range of motion and neck supple. No rigidity. No muscular tenderness.  Skin:    General: Skin is warm and dry.     Findings: No rash.  Neurological:     Mental Status: She is alert.     Comments: Alert, speech normal.   Psychiatric:        Mood and Affect: Mood normal.     ED Results / Procedures / Treatments   Labs (all labs ordered are listed, but only abnormal results are displayed) Results for orders placed or performed during the hospital encounter of 09/19/19  CBC  Result Value Ref Range   WBC 8.9 4.0 - 10.5 K/uL   RBC 3.97 3.87 - 5.11 MIL/uL   Hemoglobin 10.9 (L) 12.0 - 15.0 g/dL   HCT 34.8 (L) 36.0 - 46.0 %   MCV 87.7 80.0 - 100.0 fL   MCH 27.5 26.0 - 34.0 pg   MCHC 31.3 30.0 - 36.0 g/dL   RDW 13.5 11.5 - 15.5 %   Platelets 328 150 - 400 K/uL   nRBC 0.3 (H) 0.0 - 0.2 %  Comprehensive metabolic panel  Result Value Ref Range   Sodium 135 135 - 145 mmol/L   Potassium 3.9 3.5 - 5.1 mmol/L   Chloride 102 98 - 111 mmol/L   CO2 22 22 - 32 mmol/L   Glucose, Bld 171 (H) 70 - 99 mg/dL   BUN 19 8 - 23 mg/dL   Creatinine, Ser 1.36 (H) 0.44 - 1.00 mg/dL   Calcium 8.7 (L) 8.9 - 10.3 mg/dL   Total Protein 6.9 6.5 - 8.1 g/dL   Albumin 3.6 3.5 - 5.0 g/dL   AST 20 15 - 41 U/L   ALT 13 0 - 44 U/L     Alkaline Phosphatase 80 38 - 126 U/L   Total Bilirubin 0.5 0.3 - 1.2 mg/dL   GFR calc non Af Amer 36 (L) >60 mL/min   GFR calc Af Amer 41 (L) >60 mL/min   Anion gap 11 5 - 15  Brain natriuretic peptide  Result Value Ref Range   B Natriuretic Peptide 788.0 (H) 0.0 -  100.0 pg/mL  Troponin I (High Sensitivity)  Result Value Ref Range   Troponin I (High Sensitivity) 8 <18 ng/L  Troponin I (High Sensitivity)  Result Value Ref Range   Troponin I (High Sensitivity) 8 <18 ng/L    EKG EKG Interpretation  Date/Time:  Friday September 19 2019 12:19:10 EDT Ventricular Rate:  109 PR Interval:    QRS Duration: 80 QT Interval:  338 QTC Calculation: 455 R Axis:   -17 Text Interpretation: Atrial fibrillation with rapid ventricular response Low voltage QRS Non-specific ST-t changes `similar st dep/t changes noted on prior ecg Confirmed by Lajean Saver [phone]) on 09/19/2019 12:57:56 PM   Radiology DG Chest Port 1 View  Result Date: 09/19/2019 CLINICAL DATA:  Shortness of breath.  Chest pain. EXAM: PORTABLE CHEST 1 VIEW COMPARISON:  Chest x-ray 08/11/2019, 10/01/2018. Chest CT 05/07/2019 FINDINGS: Mediastinum and hilar structures normal. Heart size normal. Mild interstitial prominence particularly in the right base again noted. Superimposed mild right base alveolar infiltrate cannot be excluded. No pleural effusion or pneumothorax. IMPRESSION: Mild interstitial prominence particular the right base again noted. Superimposed mild right base alveolar infiltrate cannot be excluded. Electronically Signed   By: Marcello Moores  Register   On: 09/19/2019 13:43    Procedures Procedures (including critical care time)  Medications Ordered in ED Medications  diltiazem (CARDIZEM) 1 mg/mL load via infusion 10 mg (has no administration in time range)    And  diltiazem (CARDIZEM) 125 mg in dextrose 5% 125 mL (1 mg/mL) infusion (has no administration in time range)  morphine 2 MG/ML injection 2 mg (has no administration  in time range)  ondansetron (ZOFRAN) injection 4 mg (has no administration in time range)    ED Course  I have reviewed the triage vital signs and the nursing notes.  Pertinent labs & imaging results that were available during my care of the patient were reviewed by me and considered in my medical decision making (see chart for details).    MDM Rules/Calculators/A&P                      Iv ns. Continuous pulse ox and monitor. Stat labs. Ecg. Pcxr.   Reviewed nursing notes and prior charts for additional history.   Patients hr 130, in afib. Hx afib, on eliquis. cardizem iv bolus and gtt for rate control.  Initial labs reviewed/interpreted by me - initial trop is normal. bnp elevated.   Lasix iv.   Xray reviewed/interpreted by me - vascular congestion.  Recheck pt - heart rate improved.   No current chest pain or discomfort.   Likely chf exacerbation, in part due to uncontrolled afib.   Hospitalist consulted for admission.         Final Clinical Impression(s) / ED Diagnoses Final diagnoses:  None    Rx / DC Orders ED Discharge Orders    None       Lajean Saver, MD 09/19/19 1623

## 2019-09-19 NOTE — H&P (Signed)
History and Physical  [name] WUX:[id number] DOB: 04/08/1933 DOA: 09/19/2019  Referring physician: Dr Ashok Cordia , ED physician PCP: Rosalee Kaufman, PA-C  Outpatient Specialists:   Patient Coming From: home  Chief Complaint: chest pain  HPI: [name] is a 86 y.o. female with a history of atrial fibrillation with coronary artery disease with stents to the left circumflex and RCA, combined systolic diastolic heart failure, hyperlipidemia, hypertension, COPD, chronic pain, hypothyroidism.  Patient seen for chest discomfort, shortness of breath that started couple days ago and have been worsening.  Become stiff neck at approximately 15 feet of ambulation.  Symptoms are improved with rest.  Emergency Department Course: Heart rate initially in the 140s to 150s and improved with Cardizem drip.  Patient was also placed on nasal cannula due to hypoxia.  BNP elevated.  Patient given a dose of Lasix in the ED  Review of Systems:   Pt denies any fevers, chills, nausea, vomiting, diarrhea, constipation, abdominal pain, wheezing, palpitations, headache, vision changes, lightheadedness, dizziness, melena, rectal bleeding.  Review of systems are otherwise negative  Past Medical History:  Diagnosis Date  . Anxiety   . Arthritis   . Atrial fibrillation ([place])   . Bursitis    Left shoulder  . Cataract   . CHF (congestive heart failure) ([place])   . COPD (chronic obstructive pulmonary disease) ([place])   . Coronary atherosclerosis of native coronary artery    a. s/p DES to LCx in 04/2013 b. cath in 11/2015 showing patent stent with 20% prox-LAD and 80% ostial RCA stenosis for which medical management was recommended due to small artery size  . Depression   . Diastolic heart failure (HCC)    EF 55-60%  . Dysphagia, unspecified(787.20)   . Dyspnea   . Dysrhythmia   . Essential hypertension   . GERD (gastroesophageal reflux disease)    Hx Schatzki's ring, multiple EGD/ED last 01/06/2004  .  Gout   . Headache   . History of anemia   . Hyperlipidemia   . Internal hemorrhoids without mention of complication   . MI (myocardial infarction) ([place]) 2006  . Microscopic colitis 2003  . Panic disorder without agoraphobia   . Paresthesia   . Pneumonia 12/2011  . PVD (peripheral vascular disease) ([place])   . S/P colonoscopy 09/27/2001   internal hemorrhoids, desc colon inflam polyp, SB BX-chronic duodenitis, colitis  . Thyroid disease    Past Surgical History:  Procedure Laterality Date  . ABDOMINAL HYSTERECTOMY    . ABDOMINAL HYSTERECTOMY    . ANTERIOR AND POSTERIOR REPAIR     with resection of vagina  . ANTERIOR LAT LUMBAR FUSION N/A 08/01/2016   Procedure: Lumbar Two-Lumbar Five Transpsoas lateral interbody fusion with Lumbar Two-Three lateral plate fixation;  Surgeon: Kevan Ny Ditty, MD;  Location: [place];  Service: Neurosurgery;  Laterality: N/A;  L2-5 Transpsoas lateral interbody fusion with L2-3 lateral plate fixation  . APPENDECTOMY    . BACK SURGERY    . BIOPSY  07/05/2015   Procedure: BIOPSY;  Surgeon: Daneil Dolin, MD;  [facility];  Service: Endoscopy;;  gastric polyp biopsy, ascending colon biopsy  . BLADDER SUSPENSION  11/09/2011   Procedure: TRANSVAGINAL TAPE (TVT) PROCEDURE;  Surgeon: Marissa Nestle, MD;  Location: [facility];  Service: Urology;  Laterality: N/A;  . bladder tack  06/2010  . BREAST LUMPECTOMY  1998   left, benign  . CARDIAC CATHETERIZATION    . CARDIAC CATHETERIZATION    .  CARDIAC CATHETERIZATION N/A 12/16/2015   Procedure: Left Heart Cath and Coronary Angiography;  Surgeon: Troy Sine, MD;  Location: [place] [facility];  Service: Cardiovascular;  Laterality: N/A;  . CARDIOVERSION N/A 10/04/2017   Procedure: CARDIOVERSION;  Surgeon: Herminio Commons, MD;  Location: [facility];  Service: Cardiovascular;  Laterality: N/A;  . CARDIOVERSION N/A 01/30/2018   Procedure: CARDIOVERSION;  Surgeon: Herminio Commons, MD;  [facility];  Service: Cardiovascular;  Laterality: N/A;  . [place]   left  . cataract surgery    . CHOLECYSTECTOMY  1998  . Cholecystectomy    . COLONOSCOPY  03/16/2011   multiple hyperplastic colon polyps, sigmoid diverticulosis, melanosis coli  . COLONOSCOPY WITH PROPOFOL N/A 07/05/2015   RMR:one 5 mm polyp in descending colon  . CORONARY ANGIOGRAPHY N/A 05/16/2018   Procedure: CORONARY ANGIOGRAPHY (CATH LAB);  Surgeon: Belva Crome, MD;  Location: [place] [facility];  Service: Cardiovascular;  Laterality: N/A;  . CORONARY ANGIOPLASTY WITH STENT PLACEMENT    . ESOPHAGEAL DILATION N/A 07/05/2015   Procedure: ESOPHAGEAL DILATION;  Surgeon: Daneil Dolin, MD;  [facility];  Service: Endoscopy;  Laterality: N/A;  . ESOPHAGOGASTRODUODENOSCOPY (EGD) WITH PROPOFOL N/A 07/05/2015   ZOX:WRUEAV  . JOINT REPLACEMENT Right 2007   right knee  . left hand surgery    . LEFT HEART CATHETERIZATION WITH CORONARY ANGIOGRAM N/A 05/14/2013   Procedure: LEFT HEART CATHETERIZATION WITH CORONARY ANGIOGRAM;  Surgeon: Blane Ohara, MD;  Location: [facility] CATH LAB;  Service: Cardiovascular;  Laterality: N/A;  . left rotator cuff surgery    . LUMBAR LAMINECTOMY/DECOMPRESSION MICRODISCECTOMY N/A 10/11/2012   Procedure: LUMBAR LAMINECTOMY/DECOMPRESSION MICRODISCECTOMY 2 LEVELS;  Surgeon: Floyce Stakes, MD;  Location: [place] [facility]UR[facility] ORS;  Service: Neurosurgery;  Laterality: N/A;  L3-4 L4-5 Laminectomy  . LUMBAR WOUND DEBRIDEMENT N/A 09/27/2015   Procedure: Exploration of Lumbar Wound w/ Repair CSF Leak/Lumbar Drain Placement;  Surgeon: Leeroy Cha, MD;  Location: [place] NEUR[facility] ORS;  Service: Neurosurgery;  Laterality: N/A;  . MALONEY DILATION  03/16/2011   Gastritis. No H.pylori on bx. 57F maloney dilation with disruption of  occult cevical esophageal web  . NASAL SINUS SURGERY    . right knee replacement  2007  . right leg benign tumor    . SHOULDER SURGERY Left   . TEE WITHOUT CARDIOVERSION  N/A 10/04/2017   Procedure: TRANSESOPHAGEAL ECHOCARDIOGRAM (TEE) WITH PROPOFOL;  Surgeon: Herminio Commons, MD;  [facility];  Service: Cardiovascular;  Laterality: N/A;  . TONSILLECTOMY    . unspecified area, hysterectomy  1972   partial   Social History:  reports that she quit smoking about 17 years ago. Her smoking use included cigarettes. She started smoking about 71 years ago. She has a 64.00 pack-year smoking history. She has never used smokeless tobacco. She reports that she does not drink alcohol or use drugs. Patient lives at home  Allergies  Allergen Reactions  . Cephalosporins Diarrhea and Nausea Only    Lightheaded  . Levaquin [Levofloxacin In D5w] Swelling  . Macrodantin [Nitrofurantoin Macrocrystal] Swelling  . Phenothiazines Anaphylaxis and Hives  . Polysorbate Anaphylaxis  . Prednisone Shortness Of Breath  . Buspirone Itching  . Cardura [Doxazosin Mesylate] Itching  . Codeine Itching  . Acyclovir And Related Itching    Redness of skin  . Colcrys [Colchicine] Nausea Only  . Prochlorperazine Other (See Comments)    "Upset stomach"  . Ranexa [Ranolazine]  Severe drop in BP  . Atorvastatin Hives    Cramping; tolerates Crestor ok  . Ofloxacin Rash  . Other Itching and Rash    "WOOL"= make skin look like it has been burned  . Penicillins Other (See Comments)    Causes redness all over. Has patient had a PCN reaction causing immediate rash, facial/tongue/throat swelling, SOB or lightheadedness with hypotension: No Has patient had a PCN reaction causing severe rash involving mucus membranes or skin necrosis: No Has patient had a PCN reaction that required hospitalization No Has patient had a PCN reaction occurring within the last 10 years: No If all of the above answers are "NO", then may proceed with Cephalosporin use.   . Pimozide Hives and Itching    Family History  Problem Relation Age of Onset  . Stroke Mother   . Parkinson's disease Father   .  Coronary artery disease Other        family Hx-sons  . Cancer Other   . Stroke Other        family Hx  . Hypertension Other        family Hx  . Diabetes Brother   . Heart disease Son        before age 10  . Diabetes Son   . Stroke Daughter 32  . Colon cancer Neg Hx       Prior to Admission medications   Medication Sig Start Date End Date Taking? Authorizing Provider  acetaminophen (TYLENOL) 500 MG tablet Take 500 mg by mouth every 6 (six) hours as needed for headache.    Yes [provider]  ALLERGY RELIEF 10 MG tablet Take 10 mg by mouth daily. 06/25/19  Yes [provider]  ALPRAZolam (XANAX) 0.25 MG tablet Take 0.25 mg by mouth 2 (two) times daily as needed for anxiety.  09/04/16  Yes [provider]  apixaban (ELIQUIS) 5 MG TABS tablet Take 1 tablet (5 mg total) by mouth 2 (two) times daily. 07/14/19  Yes Evans Lance, MD  DULoxetine (CYMBALTA) 60 MG capsule Take 60 mg by mouth at bedtime.    Yes [provider]  fluticasone (CUTIVATE) 0.05 % cream Apply 1 application topically daily. 07/15/19  Yes [provider]  fluticasone (FLONASE) 50 MCG/ACT nasal spray Place 2 sprays into both nostrils 2 (two) times daily as needed for allergies.    Yes [provider]  furosemide (LASIX) 40 MG tablet Take 0.5 tablets (20 mg total) by mouth daily. 10/04/18  Yes Lavina Hamman, MD  ipratropium-albuterol (DUONEB) 0.5-2.5 (3) MG/3ML SOLN Take 3 mLs by nebulization every 6 (six) hours. 12/18/15  Yes Regalado, Belkys A, MD  isosorbide mononitrate (IMDUR) 60 MG 24 hr tablet Take 1 tablet (60 mg total) by mouth daily. 02/21/19 05/28/20 Yes Strader, Fransisco Hertz, PA-C  magnesium oxide (MAG-OX) 400 (241.3 Mg) MG tablet TAKE ONE TABLET BY MOUTH DAILY. 06/17/19  Yes Herminio Commons, MD  methocarbamol (ROBAXIN) 500 MG tablet Take 500 mg by mouth every 6 (six) hours as needed for muscle spasms.    Yes [provider]  metoprolol succinate  (TOPROL-XL) 50 MG 24 hr tablet Take 1 1/2 tabs by mouth daily 05/30/19  Yes Herminio Commons, MD  Multiple Vitamin (MULTIVITAMIN WITH MINERALS) TABS tablet Take 1 tablet by mouth daily. Centrum    Yes [provider]  nitroGLYCERIN (NITROSTAT) 0.4 MG SL tablet Place 1 tablet (0.4 mg total) under the tongue every 5 (five) minutes as  needed for chest pain. Reported on 08/04/2015 11/20/17  Yes Herminio Commons, MD  pantoprazole (PROTONIX) 40 MG tablet Take 40 mg by mouth daily.    Yes [provider]  potassium chloride (KLOR-CON) 10 MEQ tablet Take 1 tablet (10 mEq total) by mouth daily. 06/17/19  Yes Herminio Commons, MD  sodium chloride (OCEAN) 0.65 % SOLN nasal spray Place 1 spray into both nostrils as needed (allergies).    Yes [provider]  colchicine 0.6 MG tablet Take 1 tablet (0.6 mg total) by mouth daily. Patient not taking: Reported on 09/19/2019 10/31/18   Marybelle Killings, MD  levothyroxine (SYNTHROID, LEVOTHROID) 25 MCG tablet Take 1 tablet by mouth daily. 06/07/18   [provider]    Physical Exam: BP 113/66   Pulse 81   Temp 98.7 F (37.1 C) (Oral)   Resp 20   Ht 5\' 1"  (1.549 m)   Wt 75.8 kg   SpO2 100%   BMI 31.55 kg/m   . General: Elderly female. Awake and alert and oriented x3. No acute cardiopulmonary distress.  [name] Kitchen HEENT: Normocephalic atraumatic.  Right and left ears normal in appearance.  Pupils equal, round, reactive to light. Extraocular muscles are intact. Sclerae anicteric and noninjected.  Moist mucosal membranes. No mucosal lesions.  . Neck: Neck supple without lymphadenopathy. No carotid bruits. No masses palpated.  . Cardiovascular: Irregularly irregular with normal S1-S2 sounds. No murmurs, rubs, gallops auscultated. No JVD.  [name] Kitchen Respiratory: Rales in bases bilaterally.  No accessory muscle use. . Abdomen: Soft, nontender, nondistended. Active bowel sounds. No masses or hepatosplenomegaly  . Skin: No rashes, lesions, or  ulcerations.  Dry, warm to touch. 2+ dorsalis pedis and radial pulses. . Musculoskeletal: No calf or leg pain. All major joints not erythematous nontender.  No upper or lower joint deformation.  Good ROM.  No contractures  . Psychiatric: Intact judgment and insight. Pleasant and cooperative. . Neurologic: No focal neurological deficits. Strength is 5/5 and symmetric in upper and lower extremities.  Cranial nerves II through XII are grossly intact.           Labs on Admission: I have personally reviewed following labs and imaging studies  CBC: Recent Labs  Lab 09/19/19 1315  WBC 8.9  HGB 10.9*  HCT 34.8*  MCV 87.7  PLT 628   Basic Metabolic Panel: Recent Labs  Lab 09/19/19 1315  NA 135  K 3.9  CL 102  CO2 22  GLUCOSE 171*  BUN 19  CREATININE 1.36*  CALCIUM 8.7*   GFR: Estimated Creatinine Clearance: 28.7 mL/min (A) (by C-G formula based on SCr of 1.36 mg/dL (H)). Liver Function Tests: Recent Labs  Lab 09/19/19 1315  AST 20  ALT 13  ALKPHOS 80  BILITOT 0.5  PROT 6.9  ALBUMIN 3.6   No results for input(s): LIPASE, AMYLASE in the last 168 hours. No results for input(s): AMMONIA in the last 168 hours. Coagulation Profile: No results for input(s): INR, PROTIME in the last 168 hours. Cardiac Enzymes: No results for input(s): CKTOTAL, CKMB, CKMBINDEX, TROPONINI in the last 168 hours. BNP (last 3 results) No results for input(s): PROBNP in the last 8760 hours. HbA1C: No results for input(s): HGBA1C in the last 72 hours. CBG: No results for input(s): GLUCAP in the last 168 hours. Lipid Profile: No results for input(s): CHOL, HDL, LDLCALC, TRIG, CHOLHDL, LDLDIRECT in the last 72 hours. Thyroid Function Tests: No results for input(s): TSH, T4TOTAL, FREET4, T3FREE, THYROIDAB in the  last 72 hours. Anemia Panel: No results for input(s): VITAMINB12, FOLATE, FERRITIN, TIBC, IRON, RETICCTPCT in the last 72 hours. Urine analysis:    Component Value Date/Time    COLORURINE AMBER (A) 10/01/2018 1735   APPEARANCEUR TURBID (A) 10/01/2018 1735   LABSPEC 1.015 10/01/2018 1735   PHURINE 5.0 10/01/2018 1735   GLUCOSEU NEGATIVE 10/01/2018 1735   HGBUR MODERATE (A) 10/01/2018 1735   BILIRUBINUR NEGATIVE 10/01/2018 1735   KETONESUR NEGATIVE 10/01/2018 1735   PROTEINUR 100 (A) 10/01/2018 1735   UROBILINOGEN 0.2 02/21/2015 2028   NITRITE NEGATIVE 10/01/2018 1735   LEUKOCYTESUR MODERATE (A) 10/01/2018 1735   Sepsis Labs: @LABRCNTIP (procalcitonin:4,lacticidven:4) )No results found for this or any previous visit (from the past 240 hour(s)).   Radiological Exams on Admission: DG Chest Port 1 View  Result Date: 09/19/2019 CLINICAL DATA:  Shortness of breath.  Chest pain. EXAM: PORTABLE CHEST 1 VIEW COMPARISON:  Chest x-ray 08/11/2019, 10/01/2018. Chest CT 05/07/2019 FINDINGS: Mediastinum and hilar structures normal. Heart size normal. Mild interstitial prominence particularly in the right base again noted. Superimposed mild right base alveolar infiltrate cannot be excluded. No pleural effusion or pneumothorax. IMPRESSION: Mild interstitial prominence particular the right base again noted. Superimposed mild right base alveolar infiltrate cannot be excluded. Electronically Signed   By: Marcello Moores  Register   On: 09/19/2019 13:43    EKG: Independently reviewed.  Atrial fibrillation with RVR no acute ST changes.  Assessment/Plan: Principal Problem:   Atrial fibrillation with RVR (HCC) Active Problems:   Major depression (HCC)   CAD in native artery   Chronic kidney disease, stage III (moderate)   Essential hypertension   COPD exacerbation (HCC)   Acute on chronic diastolic congestive heart failure ([place])   Hypothyroidism    This patient was discussed with the ED physician, including pertinent vitals, physical exam findings, labs, and imaging.  We also discussed care given by the ED provider.  1. A. fib with RVR a. Observation in stepdown b. Continue Cardizem  drip and transition to oral medications when able c. Continue Eliquis d. Check TSH as it appears that patient has been without her thyroid medication for several months 2. Acute on chronic diastolic congestive heart failure Telemetry monitoring Strict I/O Daily Weights Diuresis: Lasix 40 mg IV Potassium: 40 mg mEq twice a day by mouth Echo cardiac exam tomorrow Repeat BMP tomorrow 3. Coronary artery disease with hypertension a. Continue antihypertensives 4. Stage III chronic kidney disease a. Creatinine daily 5. COPD a. Compensated  DVT prophylaxis: Eliquis Consultants: None Code Status: Full code  Family Communication: None Disposition Plan: Patient should be able to return home following admission   Truett Mainland, DO

## 2019-09-19 NOTE — ED Triage Notes (Signed)
Chest pain with shortness of breath onset yesterday

## 2019-09-20 ENCOUNTER — Observation Stay (HOSPITAL_COMMUNITY): Payer: Medicare HMO

## 2019-09-20 DIAGNOSIS — Z823 Family history of stroke: Secondary | ICD-10-CM | POA: Diagnosis not present

## 2019-09-20 DIAGNOSIS — Z833 Family history of diabetes mellitus: Secondary | ICD-10-CM | POA: Diagnosis not present

## 2019-09-20 DIAGNOSIS — I351 Nonrheumatic aortic (valve) insufficiency: Secondary | ICD-10-CM

## 2019-09-20 DIAGNOSIS — Z955 Presence of coronary angioplasty implant and graft: Secondary | ICD-10-CM | POA: Diagnosis not present

## 2019-09-20 DIAGNOSIS — F329 Major depressive disorder, single episode, unspecified: Secondary | ICD-10-CM | POA: Diagnosis present

## 2019-09-20 DIAGNOSIS — E785 Hyperlipidemia, unspecified: Secondary | ICD-10-CM | POA: Diagnosis not present

## 2019-09-20 DIAGNOSIS — I4891 Unspecified atrial fibrillation: Secondary | ICD-10-CM

## 2019-09-20 DIAGNOSIS — J441 Chronic obstructive pulmonary disease with (acute) exacerbation: Secondary | ICD-10-CM | POA: Diagnosis not present

## 2019-09-20 DIAGNOSIS — Z8249 Family history of ischemic heart disease and other diseases of the circulatory system: Secondary | ICD-10-CM | POA: Diagnosis not present

## 2019-09-20 DIAGNOSIS — I4819 Other persistent atrial fibrillation: Secondary | ICD-10-CM

## 2019-09-20 DIAGNOSIS — I10 Essential (primary) hypertension: Secondary | ICD-10-CM | POA: Diagnosis not present

## 2019-09-20 DIAGNOSIS — I130 Hypertensive heart and chronic kidney disease with heart failure and stage 1 through stage 4 chronic kidney disease, or unspecified chronic kidney disease: Secondary | ICD-10-CM | POA: Diagnosis not present

## 2019-09-20 DIAGNOSIS — Z20822 Contact with and (suspected) exposure to covid-19: Secondary | ICD-10-CM | POA: Diagnosis not present

## 2019-09-20 DIAGNOSIS — N1832 Chronic kidney disease, stage 3b: Secondary | ICD-10-CM | POA: Diagnosis not present

## 2019-09-20 DIAGNOSIS — E039 Hypothyroidism, unspecified: Secondary | ICD-10-CM | POA: Diagnosis not present

## 2019-09-20 DIAGNOSIS — Z87891 Personal history of nicotine dependence: Secondary | ICD-10-CM | POA: Diagnosis not present

## 2019-09-20 DIAGNOSIS — R0902 Hypoxemia: Secondary | ICD-10-CM | POA: Diagnosis present

## 2019-09-20 DIAGNOSIS — I739 Peripheral vascular disease, unspecified: Secondary | ICD-10-CM | POA: Diagnosis present

## 2019-09-20 DIAGNOSIS — M436 Torticollis: Secondary | ICD-10-CM | POA: Diagnosis present

## 2019-09-20 DIAGNOSIS — I2510 Atherosclerotic heart disease of native coronary artery without angina pectoris: Secondary | ICD-10-CM | POA: Diagnosis not present

## 2019-09-20 DIAGNOSIS — K219 Gastro-esophageal reflux disease without esophagitis: Secondary | ICD-10-CM | POA: Diagnosis present

## 2019-09-20 DIAGNOSIS — G3184 Mild cognitive impairment, so stated: Secondary | ICD-10-CM | POA: Diagnosis not present

## 2019-09-20 DIAGNOSIS — M109 Gout, unspecified: Secondary | ICD-10-CM | POA: Diagnosis present

## 2019-09-20 DIAGNOSIS — E875 Hyperkalemia: Secondary | ICD-10-CM | POA: Diagnosis not present

## 2019-09-20 DIAGNOSIS — I5043 Acute on chronic combined systolic (congestive) and diastolic (congestive) heart failure: Secondary | ICD-10-CM

## 2019-09-20 DIAGNOSIS — I252 Old myocardial infarction: Secondary | ICD-10-CM | POA: Diagnosis not present

## 2019-09-20 DIAGNOSIS — Z820 Family history of epilepsy and other diseases of the nervous system: Secondary | ICD-10-CM | POA: Diagnosis not present

## 2019-09-20 DIAGNOSIS — F410 Panic disorder [episodic paroxysmal anxiety] without agoraphobia: Secondary | ICD-10-CM | POA: Diagnosis present

## 2019-09-20 LAB — CBC
HCT: 35 % — ABNORMAL LOW (ref 36.0–46.0)
Hemoglobin: 10.9 g/dL — ABNORMAL LOW (ref 12.0–15.0)
MCH: 27 pg (ref 26.0–34.0)
MCHC: 31.1 g/dL (ref 30.0–36.0)
MCV: 86.6 fL (ref 80.0–100.0)
Platelets: 347 10*3/uL (ref 150–400)
RBC: 4.04 MIL/uL (ref 3.87–5.11)
RDW: 13.3 % (ref 11.5–15.5)
WBC: 6.2 10*3/uL (ref 4.0–10.5)
nRBC: 0 % (ref 0.0–0.2)

## 2019-09-20 LAB — BASIC METABOLIC PANEL
Anion gap: 11 (ref 5–15)
BUN: 20 mg/dL (ref 8–23)
CO2: 25 mmol/L (ref 22–32)
Calcium: 8.5 mg/dL — ABNORMAL LOW (ref 8.9–10.3)
Chloride: 101 mmol/L (ref 98–111)
Creatinine, Ser: 1.45 mg/dL — ABNORMAL HIGH (ref 0.44–1.00)
GFR calc Af Amer: 38 mL/min — ABNORMAL LOW (ref >60)
GFR calc non Af Amer: 33 mL/min — ABNORMAL LOW (ref >60)
Glucose, Bld: 119 mg/dL — ABNORMAL HIGH (ref 70–99)
Potassium: 4.3 mmol/L (ref 3.5–5.1)
Sodium: 137 mmol/L (ref 135–145)

## 2019-09-20 LAB — URINALYSIS, COMPLETE (UACMP) WITH MICROSCOPIC
Bilirubin Urine: NEGATIVE
Glucose, UA: NEGATIVE mg/dL
Hgb urine dipstick: NEGATIVE
Ketones, ur: NEGATIVE mg/dL
Leukocytes,Ua: NEGATIVE
Nitrite: NEGATIVE
Protein, ur: NEGATIVE mg/dL
Specific Gravity, Urine: 1.008 (ref 1.005–1.030)
pH: 5 (ref 5.0–8.0)

## 2019-09-20 LAB — ECHOCARDIOGRAM COMPLETE
Height: 61 in
Weight: 2670.21 oz

## 2019-09-20 LAB — MRSA PCR SCREENING: MRSA by PCR: NEGATIVE

## 2019-09-20 MED ORDER — CHLORHEXIDINE GLUCONATE CLOTH 2 % EX PADS
6.0000 | MEDICATED_PAD | Freq: Every day | CUTANEOUS | Status: DC
  Administered 2019-09-20 – 2019-09-22 (×3): 6 via TOPICAL

## 2019-09-20 MED ORDER — METOPROLOL SUCCINATE ER 50 MG PO TB24
50.0000 mg | ORAL_TABLET | Freq: Every day | ORAL | Status: DC
  Administered 2019-09-20 – 2019-09-21 (×2): 50 mg via ORAL
  Filled 2019-09-20 (×3): qty 1

## 2019-09-20 MED ORDER — FUROSEMIDE 10 MG/ML IJ SOLN
40.0000 mg | Freq: Every day | INTRAMUSCULAR | Status: DC
  Administered 2019-09-20 – 2019-09-21 (×2): 40 mg via INTRAVENOUS
  Filled 2019-09-20 (×2): qty 4

## 2019-09-20 NOTE — Plan of Care (Signed)
  Problem: Acute Rehab PT Goals(only PT should resolve) Goal: Pt Will Transfer Bed To Chair/Chair To Bed Flowsheets (Taken 09/20/2019 1218) Pt will Transfer Bed to Chair/Chair to Bed: with modified independence Goal: Pt Will Ambulate Flowsheets (Taken 09/20/2019 1218) Pt will Ambulate:  > 125 feet  with modified independence  with rolling walker   12:19 PM, 09/20/19 Jerene Pitch, DPT Physical Therapy with [facility]  [phone] office

## 2019-09-20 NOTE — Progress Notes (Signed)
  Echocardiogram 2D Echocardiogram has been performed.  [name] 09/20/2019, 8:04 AM

## 2019-09-20 NOTE — Evaluation (Signed)
Physical Therapy Evaluation Patient Details Name: [name] MRN: [id number] DOB: 12/27/1932 Today's Date: 09/20/2019   History of Present Illness  86 year old female with a history of persistent atrial fibrillation, anxiety, hypertension, COPD, coronary artery disease, hyperlipidemia, systolic and diastolic CHF, hyperlipidemia, CKD stage III, presenting with 3-day history of intermittent chest discomfort, heart palpitations with a feeling of heart racing and shortness of breath.  Patient denied any fevers, chills, nausea, vomiting, diarrhea, abdominal pain.  She did have some dysuria without any hematuria.   The patient underwent TEE cardioversion on 10/04/2017 for her atrial fibrillation.  She stayed in sinus rhythm for 3 days before reverting back to atrial fibrillation.  In addition, the patient recently had an event monitor that was interpreted on 05/12/2019 which showed normal sinus rhythm without any atrial fibrillation but showed NSVT up to 6 beats.  There was no pauses.   Clinical Impression   Patient very agreeable to therapy. Patient sitting in chair upon arrival. Able to complete all transfers and ambulation with supervision. Decreased strength noted with walking and 4 rest breaks taken secondary to pressure in chest and fatigue.  Patient near baseline but will continue to benefit from skilled physical therapy in hospital and recommended venue below to increase strength, balance, endurance for safe ADLs and gait. Recommend venue below upon discharge to continue to improve strength at home.    Follow Up Recommendations Home health PT    Equipment Recommendations       Recommendations for Other Services       Precautions / Restrictions Precautions Precautions: Fall Restrictions Weight Bearing Restrictions: No      Mobility  Bed Mobility Overal bed mobility: Independent                Transfers Overall transfer level: Modified independent                   Ambulation/Gait Ambulation/Gait assistance: Supervision Gait Distance (Feet): 100 Feet Assistive device: Rolling walker (2 wheeled) Gait Pattern/deviations: Decreased stride length Gait velocity: decreased   General Gait Details: overall patient ambulates well and knows limitations, 4 standing rest breaks taken during walk but only supervision and assist with lines/leads  Stairs            Wheelchair Mobility    Modified Rankin (Stroke Patients Only)       Balance Overall balance assessment: Mild deficits observed, not formally tested                                           Pertinent Vitals/Pain Pain Assessment: No/denies pain    Home Living Family/patient expects to be discharged to:: Private residence Living Arrangements: Children Available Help at Discharge: Family;Available PRN/intermittently Type of Home: House Home Access: Ramped entrance       Home Equipment: Shower seat;Walker - 2 wheels;Bedside commode;Cane - single point Additional Comments: Pt lives with her children in 1 story home    Prior Function Level of Independence: Independent with assistive device(s)         Comments: household ambulato with rollator     Hand Dominance        Extremity/Trunk Assessment                Communication   Communication: No difficulties  Cognition Arousal/Alertness: Awake/alert Behavior During Therapy: WFL for tasks assessed/performed Overall Cognitive Status: Within Functional Limits  for tasks assessed                                        General Comments      Exercises General Exercises - Lower Extremity Long Arc Quad: AROM;Both;10 reps;Seated Toe Raises: AROM;Seated;Both;10 reps Heel Raises: AROM;Seated;Both;10 reps   Assessment/Plan    PT Assessment Patient needs continued PT services  PT Problem List Decreased strength;Decreased activity tolerance;Decreased balance;Decreased mobility        PT Treatment Interventions Gait training;Therapeutic exercise;Therapeutic activities    PT Goals (Current goals can be found in the Care Plan section)  Acute Rehab PT Goals Patient Stated Goal: to go home PT Goal Formulation: With patient Time For Goal Achievement: 10/04/19 Potential to Achieve Goals: Good    Frequency Min 3X/week   Barriers to discharge   no barriers noted    Co-evaluation               AM-PAC PT "6 Clicks" Mobility  Outcome Measure Help needed turning from your back to your side while in a flat bed without using bedrails?: None Help needed moving from lying on your back to sitting on the side of a flat bed without using bedrails?: None Help needed moving to and from a bed to a chair (including a wheelchair)?: None Help needed standing up from a chair using your arms (e.g., wheelchair or bedside chair)?: None Help needed to walk in hospital room?: A Little Help needed climbing 3-5 steps with a railing? : A Lot 6 Click Score: 21    End of Session Equipment Utilized During Treatment: Gait belt Activity Tolerance: Patient tolerated treatment well;No increased pain;Patient limited by fatigue Patient left: in chair;with call bell/phone within reach Nurse Communication: Mobility status PT Visit Diagnosis: Unsteadiness on feet (R26.81);Muscle weakness (generalized) (M62.81);History of falling (Z91.81);Difficulty in walking, not elsewhere classified (R26.2)    Time:  -      Charges:   PT Evaluation $PT Eval Low Complexity: 1 Low PT Treatments $Therapeutic Exercise: 8-22 mins        12:13 PM, 09/20/19 Jerene Pitch, DPT Physical Therapy with [facility]  [phone] office

## 2019-09-20 NOTE — Progress Notes (Signed)
PROGRESS NOTE  [name] YWV:[id number] DOB: June 04, 1932 DOA: 09/19/2019 PCP: Rosalee Kaufman, PA-C  Brief History:  86 year old female with a history of persistent atrial fibrillation, anxiety, hypertension, COPD, coronary artery disease, hyperlipidemia, systolic and diastolic CHF, hyperlipidemia, CKD stage III, presenting with 3-day history of intermittent chest discomfort, heart palpitations with a feeling of heart racing and shortness of breath.  Patient denied any fevers, chills, nausea, vomiting, diarrhea, abdominal pain.  She did have some dysuria without any hematuria.  The patient underwent TEEcardioversion on 10/04/2017 for her atrial fibrillation. She stayed in sinus rhythm for 3 days before reverting back to atrial fibrillation.  In addition, the patient recently had an event monitor that was interpreted on 05/12/2019 which showed normal sinus rhythm without any atrial fibrillation but showed NSVT up to 6 beats.  There was no pauses. In the emergency department, the patient was afebrile hemodynamically stable.  Oxygen saturation was 90% room air.  Serum creatinine was 1.36.  WBC 8.9, hemoglobin 9.9, platelets 320,000.  Chest x-ray showed increased interstitial markings.  BNP was 788.  TSH was 3.127.  The patient was started on intravenous furosemide.  Assessment/Plan: Acute on chronic systolic and diastolic CHF -11/19/4625 echo EF 30-35% -10/02/2018 echo EF 60 to 65%, impaired relaxation -Continue IV furosemide -Daily weights -Accurate I's and O's -Repeat echocardiogram  Atrial fibrillation with RVR, persistent -Continue apixaban -Restart metoprolol succinate -Wean off diltiazem drip  CKD stage IIIb -Baseline creatinine 1.1-1.4 -Monitor with diuresis  COPD -Stable presently without bronchospasm -Continue duo nebs  Coronary artery disease -Continue metoprolol succinate, Imdur -12/16/15 showed 20% stenosis in the proximal LAD and in the inferior branch of the  first diagonal with a widely patent previously placed circumflex stent. RCA was small and nondominant with 80% ostial stenosis. Medical therapy was recommended.  Anxiety/depression -Continue Xanax, Cymbalta  Essential hypertension -Continue metoprolol succinate and Imdur  Hypothyroidism -Continue levothyroxine -TSH 3.127    Status is: Observation  The patient will require care spanning > 2 midnights and should be moved to inpatient because: IV treatments appropriate due to intensity of illness or inability to take PO  Remains on lasix and diltiazem drip   Dispo: The patient is from: Home              Anticipated d/c is to: Home              Anticipated d/c date is: 2 days              Patient currently is not medically stable to d/c.        Family Communication: No  Family at bedside  Consultants:  none  Code Status:  FULL   DVT Prophylaxis:  apixaban   Procedures: As Listed in Progress Note Above  Antibiotics: None    Subjective: She still has some shortness of breath with exertion.  She denies any chest pain, fevers, chills, headache, nausea, vomiting, diarrhea, abdominal pain, hematuria, hematochezia, melena.  Objective: Vitals:   09/20/19 0615 09/20/19 0630 09/20/19 0645 09/20/19 0732  BP: (!) 133/43 128/66 (!) 113/42 (!) 125/54  Pulse: 62 (!) 59 (!) 46 (!) 56  Resp: 18 19 18 19   Temp:    (!) 97.5 F (36.4 C)  TempSrc:    Oral  SpO2: 100% 98% 99% 96%  Weight:      Height:       No intake or output data in the 24 hours  ending 09/20/19 0901 Weight change:  Exam:   General:  Pt is alert, follows commands appropriately, not in acute distress  HEENT: No icterus, No thrush, No neck mass, [place]/AT  Cardiovascular: IRRR, S1/S2, no rubs, no gallops  Respiratory: Bibasal crackles but no wheezing.  Good air movement  Abdomen: Soft/+BS, non tender, non distended, no guarding  Extremities: trace LE edema, No lymphangitis, No petechiae, No rashes, no  synovitis   Data Reviewed: I have personally reviewed following labs and imaging studies Basic Metabolic Panel: Recent Labs  Lab 09/19/19 1315 09/20/19 0419  NA 135 137  K 3.9 4.3  CL 102 101  CO2 22 25  GLUCOSE 171* 119*  BUN 19 20  CREATININE 1.36* 1.45*  CALCIUM 8.7* 8.5*   Liver Function Tests: Recent Labs  Lab 09/19/19 1315  AST 20  ALT 13  ALKPHOS 80  BILITOT 0.5  PROT 6.9  ALBUMIN 3.6   No results for input(s): LIPASE, AMYLASE in the last 168 hours. No results for input(s): AMMONIA in the last 168 hours. Coagulation Profile: No results for input(s): INR, PROTIME in the last 168 hours. CBC: Recent Labs  Lab 09/19/19 1315 09/20/19 0419  WBC 8.9 6.2  HGB 10.9* 10.9*  HCT 34.8* 35.0*  MCV 87.7 86.6  PLT 328 347   Cardiac Enzymes: No results for input(s): CKTOTAL, CKMB, CKMBINDEX, TROPONINI in the last 168 hours. BNP: Invalid input(s): POCBNP CBG: No results for input(s): GLUCAP in the last 168 hours. HbA1C: No results for input(s): HGBA1C in the last 72 hours. Urine analysis:    Component Value Date/Time   COLORURINE AMBER (A) 10/01/2018 1735   APPEARANCEUR TURBID (A) 10/01/2018 1735   LABSPEC 1.015 10/01/2018 1735   PHURINE 5.0 10/01/2018 1735   GLUCOSEU NEGATIVE 10/01/2018 1735   HGBUR MODERATE (A) 10/01/2018 1735   BILIRUBINUR NEGATIVE 10/01/2018 1735   KETONESUR NEGATIVE 10/01/2018 1735   PROTEINUR 100 (A) 10/01/2018 1735   UROBILINOGEN 0.2 02/21/2015 2028   NITRITE NEGATIVE 10/01/2018 1735   LEUKOCYTESUR MODERATE (A) 10/01/2018 1735   Sepsis Labs: @LABRCNTIP (procalcitonin:4,lacticidven:4) ) Recent Results (from the past 240 hour(s))  SARS Coronavirus 2 by RT PCR (hospital order, performed in [place] hospital lab) Nasopharyngeal Nasopharyngeal Swab     Status: None   Collection Time: 09/19/19  6:10 PM   Specimen: Nasopharyngeal Swab  Result Value Ref Range Status   SARS Coronavirus 2 NEGATIVE NEGATIVE Final    Comment:  (NOTE) SARS-CoV-2 target nucleic acids are NOT DETECTED. The SARS-CoV-2 RNA is generally detectable in upper and lower respiratory specimens during the acute phase of infection. The lowest concentration of SARS-CoV-2 viral copies this assay can detect is 250 copies / mL. A negative result does not preclude SARS-CoV-2 infection and should not be used as the sole basis for treatment or other patient management decisions.  A negative result may occur with improper specimen collection / handling, submission of specimen other than nasopharyngeal swab, presence of viral mutation(s) within the areas targeted by this assay, and inadequate number of viral copies (<250 copies / mL). A negative result must be combined with clinical observations, patient history, and epidemiological information. Fact Sheet for Patients:   StrictlyIdeas.no Fact Sheet for Healthcare Providers: BankingDealers.co.za This test is not yet approved or cleared  by the [place] FDA and has been authorized for detection and/or diagnosis of SARS-CoV-2 by FDA under an Emergency Use Authorization (EUA).  This EUA will remain in effect (meaning this test can be used) for the  duration of the COVID-19 declaration under Section 564(b)(1) of the Act, 21 U.S.C. section 360bbb-3(b)(1), unless the authorization is terminated or revoked sooner. Performed at [facility], [address], [place], [place] [address]   MRSA PCR Screening     Status: None   Collection Time: 09/19/19  9:00 PM   Specimen: Nasal Mucosa; Nasopharyngeal  Result Value Ref Range Status   MRSA by PCR NEGATIVE NEGATIVE Final    Comment:        The GeneXpert MRSA Assay (FDA approved for NASAL specimens only), is one component of a comprehensive MRSA colonization surveillance program. It is not intended to diagnose MRSA infection nor to guide or monitor treatment for MRSA infections. Performed at [facility], [address] [place], [place] [address]      Scheduled Meds: . apixaban  5 mg Oral BID  . Chlorhexidine Gluconate Cloth  6 each Topical Daily  . DULoxetine  60 mg Oral QHS  . furosemide  40 mg Intravenous Q12H  . ipratropium-albuterol  3 mL Nebulization BID  . isosorbide mononitrate  60 mg Oral Daily  . levothyroxine  25 mcg Oral Q0600  . magnesium oxide  400 mg Oral Daily  . pantoprazole  40 mg Oral Daily  . potassium chloride  40 mEq Oral BID  . sodium chloride flush  3 mL Intravenous Q12H   Continuous Infusions: . sodium chloride    . diltiazem (CARDIZEM) infusion 5 mg/hr (09/19/19 2152)    Procedures/Studies: DG Chest Port 1 View  Result Date: 09/19/2019 CLINICAL DATA:  Shortness of breath.  Chest pain. EXAM: PORTABLE CHEST 1 VIEW COMPARISON:  Chest x-ray 08/11/2019, 10/01/2018. Chest CT 05/07/2019 FINDINGS: Mediastinum and hilar structures normal. Heart size normal. Mild interstitial prominence particularly in the right base again noted. Superimposed mild right base alveolar infiltrate cannot be excluded. No pleural effusion or pneumothorax. IMPRESSION: Mild interstitial prominence particular the right base again noted. Superimposed mild right base alveolar infiltrate cannot be excluded. Electronically Signed   By: Marcello Moores  Register   On: 09/19/2019 13:43    Orson Eva, DO  [facility]  If 7PM-7AM, please contact night-coverage [email or web address] Password [email or web address] 09/20/2019, 9:01 AM   LOS: 0 days

## 2019-09-21 LAB — BASIC METABOLIC PANEL
Anion gap: 8 (ref 5–15)
BUN: 22 mg/dL (ref 8–23)
CO2: 28 mmol/L (ref 22–32)
Calcium: 8.4 mg/dL — ABNORMAL LOW (ref 8.9–10.3)
Chloride: 101 mmol/L (ref 98–111)
Creatinine, Ser: 1.53 mg/dL — ABNORMAL HIGH (ref 0.44–1.00)
GFR calc Af Amer: 36 mL/min — ABNORMAL LOW (ref >60)
GFR calc non Af Amer: 31 mL/min — ABNORMAL LOW (ref >60)
Glucose, Bld: 126 mg/dL — ABNORMAL HIGH (ref 70–99)
Potassium: 4.9 mmol/L (ref 3.5–5.1)
Sodium: 137 mmol/L (ref 135–145)

## 2019-09-21 LAB — MAGNESIUM: Magnesium: 1.8 mg/dL (ref 1.7–2.4)

## 2019-09-21 MED ORDER — FUROSEMIDE 40 MG PO TABS
40.0000 mg | ORAL_TABLET | Freq: Every day | ORAL | Status: DC
  Administered 2019-09-22 – 2019-09-23 (×2): 40 mg via ORAL
  Filled 2019-09-21 (×2): qty 1

## 2019-09-21 MED ORDER — DILTIAZEM HCL 30 MG PO TABS
30.0000 mg | ORAL_TABLET | Freq: Four times a day (QID) | ORAL | Status: DC
  Administered 2019-09-21 – 2019-09-22 (×4): 30 mg via ORAL
  Filled 2019-09-21 (×4): qty 1

## 2019-09-21 MED ORDER — METOPROLOL SUCCINATE ER 25 MG PO TB24
25.0000 mg | ORAL_TABLET | Freq: Once | ORAL | Status: AC
  Administered 2019-09-21: 25 mg via ORAL
  Filled 2019-09-21: qty 1

## 2019-09-21 MED ORDER — APIXABAN 2.5 MG PO TABS
2.5000 mg | ORAL_TABLET | Freq: Two times a day (BID) | ORAL | Status: DC
  Administered 2019-09-21 – 2019-09-23 (×5): 2.5 mg via ORAL
  Filled 2019-09-21 (×5): qty 1

## 2019-09-21 MED ORDER — METOPROLOL SUCCINATE ER 50 MG PO TB24
75.0000 mg | ORAL_TABLET | Freq: Every day | ORAL | Status: DC
  Administered 2019-09-22 – 2019-09-23 (×2): 75 mg via ORAL
  Filled 2019-09-21 (×2): qty 1

## 2019-09-21 NOTE — Progress Notes (Signed)
PROGRESS NOTE  [name] TGY:[id number] DOB: [birth year] DOA: 09/19/2019 PCP: Rosalee Kaufman, PA-C   Brief History:  86 year old female with a history of persistent atrial fibrillation, anxiety, hypertension, COPD, coronary artery disease, hyperlipidemia, systolic and diastolic CHF, hyperlipidemia, CKD stage III, presenting with 3-day history of intermittent chest discomfort, heart palpitations with a feeling of heart racing and shortness of breath.  Patient denied any fevers, chills, nausea, vomiting, diarrhea, abdominal pain.  She did have some dysuria without any hematuria. The patient underwent TEEcardioversion on 10/04/2017 for her atrial fibrillation. She stayed in sinus rhythm for 3 days before reverting back to atrial fibrillation.  In addition, the patient recently had an event monitor that was interpreted on 05/12/2019 which showed normal sinus rhythm without any atrial fibrillation but showed NSVT up to 6 beats.  There was no pauses. In the emergency department, the patient was afebrile hemodynamically stable.  Oxygen saturation was 90% room air.  Serum creatinine was 1.36.  WBC 8.9, hemoglobin 9.9, platelets 320,000.  Chest x-ray showed increased interstitial markings.  BNP was 788.  TSH was 3.127.  The patient was started on intravenous furosemide.  Assessment/Plan: Acute on chronic systolic and diastolic CHF -05/26/7679 echo EF 30-35% -10/02/2018 echo EF 60 to 65%, impaired relaxation -Continue IV furosemide>>po lasix on 6/7 -Daily weights -Accurate I's and O's -6/6 echo EF 60-65%, no WMA, trivival MR/TR  Atrial fibrillation with RVR, persistent -Continue apixaban -Restart metoprolol succinate--increase to 75 mg -start diltiazem 30 mg q 6  CKD stage IIIb -Baseline creatinine 1.1-1.4 -Monitor with diuresis  COPD -Stable presently without bronchospasm -Continue duo nebs  Coronary artery disease -Continue metoprolol succinate, Imdur -12/16/15 showed  20% stenosis in the proximal LAD and in the inferior branch of the first diagonal with a widely patent previously placed circumflex stent. RCA was small and nondominant with 80% ostial stenosis. Medical therapy was recommended.  Anxiety/depression -Continue Xanax, Cymbalta  Essential hypertension -Continue metoprolol succinate and Imdur  Hypothyroidism -Continue levothyroxine -TSH 3.127    Status is: inpatient  The patient will require care spanning > 2 midnights and should be moved to inpatient because: IV treatments appropriate due to intensity of illness or inability to take PO  HR not optimally controlled.  Remains in RVR   Dispo: The patient is from: Home  Anticipated d/c is to: Home  Anticipated d/c date is: 1 days  Patient currently is not medically stable to d/c.        Family Communication: No  Family at bedside  Consultants:  none  Code Status:  FULL   DVT Prophylaxis:  apixaban   Procedures: As Listed in Progress Note Above  Antibiotics: None      Subjective: Pt still complains of dyspnea on exertion.  Denies cp, n/v/d, abd pain.  Overall sob is slowly improving.  No f/c, abd pain.  Objective: Vitals:   09/21/19 0804 09/21/19 0900 09/21/19 1020 09/21/19 1134  BP:      Pulse:  89    Resp:      Temp: 97.8 F (36.6 C)   97.8 F (36.6 C)  TempSrc: Oral   Oral  SpO2:  97% 98%   Weight:      Height:        Intake/Output Summary (Last 24 hours) at 09/21/2019 1228 Last data filed at 09/21/2019 0900 Gross per 24 hour  Intake 1080 ml  Output 750 ml  Net 330 ml  Weight change: -0.651 kg Exam:   General:  Pt is alert, follows commands appropriately, not in acute distress  HEENT: No icterus, No thrush, No neck mass, [place]/AT  Cardiovascular: IRRR, S1/S2, no rubs, no gallops  Respiratory: fine bibasilar crackles.  Abdomen: Soft/+BS, non tender, non distended, no  guarding  Extremities: No edema, No lymphangitis, No petechiae, No rashes, no synovitis   Data Reviewed: I have personally reviewed following labs and imaging studies Basic Metabolic Panel: Recent Labs  Lab 09/19/19 1315 09/20/19 0419 09/21/19 0312  NA 135 137 137  K 3.9 4.3 4.9  CL 102 101 101  CO2 22 25 28   GLUCOSE 171* 119* 126*  BUN 19 20 22   CREATININE 1.36* 1.45* 1.53*  CALCIUM 8.7* 8.5* 8.4*  MG  --   --  1.8   Liver Function Tests: Recent Labs  Lab 09/19/19 1315  AST 20  ALT 13  ALKPHOS 80  BILITOT 0.5  PROT 6.9  ALBUMIN 3.6   No results for input(s): LIPASE, AMYLASE in the last 168 hours. No results for input(s): AMMONIA in the last 168 hours. Coagulation Profile: No results for input(s): INR, PROTIME in the last 168 hours. CBC: Recent Labs  Lab 09/19/19 1315 09/20/19 0419  WBC 8.9 6.2  HGB 10.9* 10.9*  HCT 34.8* 35.0*  MCV 87.7 86.6  PLT 328 347   Cardiac Enzymes: No results for input(s): CKTOTAL, CKMB, CKMBINDEX, TROPONINI in the last 168 hours. BNP: Invalid input(s): POCBNP CBG: No results for input(s): GLUCAP in the last 168 hours. HbA1C: No results for input(s): HGBA1C in the last 72 hours. Urine analysis:    Component Value Date/Time   COLORURINE STRAW (A) 09/20/2019 0922   APPEARANCEUR CLEAR 09/20/2019 0922   LABSPEC 1.008 09/20/2019 0922   PHURINE 5.0 09/20/2019 0922   GLUCOSEU NEGATIVE 09/20/2019 0922   HGBUR NEGATIVE 09/20/2019 0922   BILIRUBINUR NEGATIVE 09/20/2019 0922   KETONESUR NEGATIVE 09/20/2019 0922   PROTEINUR NEGATIVE 09/20/2019 0922   UROBILINOGEN 0.2 02/21/2015 2028   NITRITE NEGATIVE 09/20/2019 0922   LEUKOCYTESUR NEGATIVE 09/20/2019 0922   Sepsis Labs: @LABRCNTIP (procalcitonin:4,lacticidven:4) ) Recent Results (from the past 240 hour(s))  SARS Coronavirus 2 by RT PCR (hospital order, performed in [facility] hospital lab) Nasopharyngeal Nasopharyngeal Swab     Status: None   Collection Time: 09/19/19  6:10  PM   Specimen: Nasopharyngeal Swab  Result Value Ref Range Status   SARS Coronavirus 2 NEGATIVE NEGATIVE Final    Comment: (NOTE) SARS-CoV-2 target nucleic acids are NOT DETECTED. The SARS-CoV-2 RNA is generally detectable in upper and lower respiratory specimens during the acute phase of infection. The lowest concentration of SARS-CoV-2 viral copies this assay can detect is 250 copies / mL. A negative result does not preclude SARS-CoV-2 infection and should not be used as the sole basis for treatment or other patient management decisions.  A negative result may occur with improper specimen collection / handling, submission of specimen other than nasopharyngeal swab, presence of viral mutation(s) within the areas targeted by this assay, and inadequate number of viral copies (<250 copies / mL). A negative result must be combined with clinical observations, patient history, and epidemiological information. Fact Sheet for Patients:   StrictlyIdeas.no Fact Sheet for Healthcare Providers: BankingDealers.co.za This test is not yet approved or cleared  by the [place] FDA and has been authorized for detection and/or diagnosis of SARS-CoV-2 by FDA under an Emergency Use Authorization (EUA).  This EUA will remain in effect (meaning this  test can be used) for the duration of the COVID-19 declaration under Section 564(b)(1) of the Act, 21 U.S.C. section 360bbb-3(b)(1), unless the authorization is terminated or revoked sooner. Performed at [facility], [address], [place], [place] [address]   MRSA PCR Screening     Status: None   Collection Time: 09/19/19  9:00 PM   Specimen: Nasal Mucosa; Nasopharyngeal  Result Value Ref Range Status   MRSA by PCR NEGATIVE NEGATIVE Final    Comment:        The GeneXpert MRSA Assay (FDA approved for NASAL specimens only), is one component of a comprehensive MRSA colonization surveillance program. It  is not intended to diagnose MRSA infection nor to guide or monitor treatment for MRSA infections. Performed at [facility], [address] [place], [place] [address]      Scheduled Meds: . apixaban  2.5 mg Oral BID  . Chlorhexidine Gluconate Cloth  6 each Topical Daily  . diltiazem  30 mg Oral Q6H  . DULoxetine  60 mg Oral QHS  . [START ON 09/22/2019] furosemide  40 mg Oral Daily  . ipratropium-albuterol  3 mL Nebulization BID  . isosorbide mononitrate  60 mg Oral Daily  . levothyroxine  25 mcg Oral Q0600  . magnesium oxide  400 mg Oral Daily  . metoprolol succinate  25 mg Oral Once  . metoprolol succinate  50 mg Oral Daily  . [START ON 09/22/2019] metoprolol succinate  75 mg Oral Daily  . pantoprazole  40 mg Oral Daily  . potassium chloride  40 mEq Oral BID  . sodium chloride flush  3 mL Intravenous Q12H   Continuous Infusions: . sodium chloride      Procedures/Studies: DG Chest Port 1 View  Result Date: 09/19/2019 CLINICAL DATA:  Shortness of breath.  Chest pain. EXAM: PORTABLE CHEST 1 VIEW COMPARISON:  Chest x-ray 08/11/2019, 10/01/2018. Chest CT 05/07/2019 FINDINGS: Mediastinum and hilar structures normal. Heart size normal. Mild interstitial prominence particularly in the right base again noted. Superimposed mild right base alveolar infiltrate cannot be excluded. No pleural effusion or pneumothorax. IMPRESSION: Mild interstitial prominence particular the right base again noted. Superimposed mild right base alveolar infiltrate cannot be excluded. Electronically Signed   By: Marcello Moores  Register   On: 09/19/2019 13:43   ECHOCARDIOGRAM COMPLETE  Result Date: 09/20/2019    ECHOCARDIOGRAM REPORT   Patient Name:   [name] Date of Exam: 09/20/2019 Medical Rec #:  [id number]      Height:       61.0 in Accession #:    [id number]     Weight:       166.9 lb Date of Birth:  03-18-1933       BSA:          1.749 m Patient Age:    46 years       BP:           125/54 mmHg Patient Gender: F               HR:           56 bpm. Exam Location:  [name] Procedure: 2D Echo, Cardiac Doppler and Color Doppler Indications:    Atrial fibrillation  History:        Patient has prior history of Echocardiogram examinations, most                 recent 10/02/2018. CHF, CAD and Previous Myocardial Infarction,  COPD, Arrythmias:Atrial Fibrillation, Signs/Symptoms:Shortness                 of Breath; Risk Factors:Hypertension and Dyslipidemia.  Sonographer:    Dustin Flock RDCS Referring Phys: [phone] JACOB J STINSON  Sonographer Comments: Image acquisition challenging due to COPD. IMPRESSIONS  1. Left ventricular ejection fraction, by estimation, is 60 to 65%. The left ventricle has normal function. The left ventricle has no regional wall motion abnormalities. There is mild left ventricular hypertrophy. Left ventricular diastolic parameters are indeterminate.  2. Right ventricular systolic function is normal. The right ventricular size is normal. There is normal pulmonary artery systolic pressure.  3. The mitral valve is normal in structure. Trivial mitral valve regurgitation. No evidence of mitral stenosis.  4. The aortic valve was not well visualized. Aortic valve regurgitation is mild. No aortic stenosis is present. FINDINGS  Left Ventricle: Left ventricular ejection fraction, by estimation, is 60 to 65%. The left ventricle has normal function. The left ventricle has no regional wall motion abnormalities. The left ventricular internal cavity size was normal in size. There is  mild left ventricular hypertrophy. Left ventricular diastolic parameters are indeterminate. Right Ventricle: The right ventricular size is normal. No increase in right ventricular wall thickness. Right ventricular systolic function is normal. There is normal pulmonary artery systolic pressure. The tricuspid regurgitant velocity is 2.12 m/s, and  with an assumed right atrial pressure of 3 mmHg, the estimated right ventricular  systolic pressure is 52.7 mmHg. Left Atrium: Left atrial size was normal in size. Right Atrium: Right atrial size was normal in size. Pericardium: There is no evidence of pericardial effusion. Mitral Valve: The mitral valve is normal in structure. Trivial mitral valve regurgitation. No evidence of mitral valve stenosis. Tricuspid Valve: The tricuspid valve is normal in structure. Tricuspid valve regurgitation is trivial. No evidence of tricuspid stenosis. Aortic Valve: The aortic valve was not well visualized. Aortic valve regurgitation is mild. Aortic regurgitation PHT measures 567 msec. No aortic stenosis is present. Aortic valve mean gradient measures 3.7 mmHg. Aortic valve peak gradient measures 7.6 mmHg. Aortic valve area, by VTI measures 1.79 cm. Pulmonic Valve: The pulmonic valve was not well visualized. Pulmonic valve regurgitation is not visualized. No evidence of pulmonic stenosis. Aorta: The aortic root is normal in size and structure. IAS/Shunts: No atrial level shunt detected by color flow Doppler.  LEFT VENTRICLE PLAX 2D LVIDd:         3.74 cm  Diastology LVIDs:         2.52 cm  LV e' lateral:   7.62 cm/s LV PW:         1.15 cm  LV E/e' lateral: 12.9 LV IVS:        1.20 cm  LV e' medial:    6.31 cm/s LVOT diam:     2.10 cm  LV E/e' medial:  15.5 LV SV:         54 LV SV Index:   31 LVOT Area:     3.46 cm  RIGHT VENTRICLE RV Basal diam:  3.47 cm RV S prime:     11.30 cm/s TAPSE (M-mode): 2.6 cm LEFT ATRIUM             Index       RIGHT ATRIUM           Index LA diam:        4.10 cm 2.34 cm/m  RA Area:     13.80 cm LA Vol (A2C):  36.0 ml 20.58 ml/m RA Volume:   35.40 ml  20.24 ml/m LA Vol (A4C):   39.9 ml 22.81 ml/m LA Biplane Vol: 39.3 ml 22.47 ml/m  AORTIC VALVE AV Area (Vmax):    1.69 cm AV Area (Vmean):   1.61 cm AV Area (VTI):     1.79 cm AV Vmax:           137.68 cm/s AV Vmean:          90.933 cm/s AV VTI:            0.304 m AV Peak Grad:      7.6 mmHg AV Mean Grad:      3.7 mmHg LVOT  Vmax:         67.20 cm/s LVOT Vmean:        42.300 cm/s LVOT VTI:          0.157 m LVOT/AV VTI ratio: 0.52 AI PHT:            567 msec  AORTA Ao Root diam: 2.90 cm MITRAL VALVE               TRICUSPID VALVE MV Area (PHT): 4.19 cm    TR Peak grad:   18.0 mmHg MV Decel Time: 181 msec    TR Vmax:        212.00 cm/s MV E velocity: 98.10 cm/s MV A velocity: 32.10 cm/s  SHUNTS MV E/A ratio:  3.06        Systemic VTI:  0.16 m                            Systemic Diam: 2.10 cm Carlyle Dolly MD Electronically signed by Carlyle Dolly MD Signature Date/Time: 09/20/2019/2:01:18 PM    Final     Orson Eva, DO  [facility]  If 7PM-7AM, please contact night-coverage [email or web address] Password [email or web address] 09/21/2019, 12:28 PM   LOS: 1 day

## 2019-09-21 NOTE — TOC Initial Note (Signed)
Transition of Care [facility]) - Initial/Assessment Note    Patient Details  Name: [name] MRN: [id number] Date of Birth: 1932-07-16  Transition of Care [facility]) CM/SW Contact:    Shade Flood, LCSW Phone Number: 09/21/2019, 1:36 PM  Clinical Narrative:                  Pt admitted from home. PT recommending HHPT at dc. Met with pt today to assess and discuss HH recommendation. Pt reports that she lives alone but that her daughter lives right next door and assists pt as needed. Per pt, she does not have any trouble getting to appointments or obtaining medications as needed.   Discussed HH PT and CMS provider options. Pt agreeable to [facility]. Referred as requested.  DC timeframe not yet known. [facility] HH will follow at dc. TOC will update [facility] once dc date known.  Expected Discharge Plan: [place] Barriers to Discharge: Continued Medical Work up   Patient Goals and CMS Choice Patient states their goals for this hospitalization and ongoing recovery are:: go home CMS Medicare.gov Compare Post Acute Care list provided to:: Patient Choice offered to / list presented to : Patient  Expected Discharge Plan and Services Expected Discharge Plan: [place] In-house Referral: Clinical Social Work   Post Acute Care Choice: [place] arrangements for the past 2 months: [place]: PT [place]: [place] Date [place]: 09/21/19 Time [place]: [id number] Representative spoke with at [place]: [place] Arrangements/Services Living arrangements for the past 2 months: [place] with:: Self Patient language and need for interpreter reviewed:: Yes Do you feel safe going back to the place where you live?: Yes      Need for Family Participation in Patient Care: Yes (Comment) Care giver support system in place?: Yes (comment)   Criminal  Activity/Legal Involvement Pertinent to Current Situation/Hospitalization: No - Comment as needed  Activities of Daily Living Home Assistive Devices/Equipment: Grab bars in shower, Grab bars around toilet, Wheelchair, Environmental consultant (specify type), Cane (specify quad or straight), Hospital bed ADL Screening (condition at time of admission) Patient's cognitive ability adequate to safely complete daily activities?: Yes Is the patient deaf or have difficulty hearing?: No Does the patient have difficulty seeing, even when wearing glasses/contacts?: No Does the patient have difficulty concentrating, remembering, or making decisions?: No Patient able to express need for assistance with ADLs?: Yes Does the patient have difficulty dressing or bathing?: No Independently performs ADLs?: Yes (appropriate for developmental age) Communication: Independent Dressing (OT): Independent Grooming: Independent Feeding: Independent Bathing: Independent Toileting: Independent In/Out Bed: Independent Walks in Home: Independent Does the patient have difficulty walking or climbing stairs?: Yes Weakness of Legs: Both Weakness of Arms/Hands: None  Permission Sought/Granted Permission sought to share information with : Chartered certified accountant granted to share information with : Yes, Verbal Permission Granted     Permission granted to share info w AGENCY: [place] agencies        Emotional Assessment Appearance:: Appears younger than stated age Attitude/Demeanor/Rapport: Engaged Affect (typically observed): Pleasant Orientation: : Oriented to Self, Oriented to Place, Oriented to  Time, Oriented to Situation Alcohol / Substance Use: Not Applicable Psych Involvement: No (comment)  Admission diagnosis:  Atrial fibrillation with  rapid ventricular response (HCC) [I48.91] Atrial fibrillation with RVR (HCC) [I48.91] Acute on chronic combined systolic and diastolic CHF (congestive heart failure) ([place])  [I50.43] Patient Active Problem List   Diagnosis Date Noted  . Atrial fibrillation with rapid ventricular response ([place])   . Hypothyroidism 09/19/2019  . AKI (acute kidney injury) ([place]) 10/01/2018  . Persistent atrial fibrillation 12/30/2017  . Acute on chronic combined systolic and diastolic heart failure ([place]) 12/30/2017  . Hyperammonemia ([place]) 02/20/2017  . Myoclonic jerking 02/20/2017  . Atrial fibrillation ([place]) 02/19/2017  . Acute gout 02/17/2017  . Chronic systolic heart failure ([place])   . Chest pain 09/30/2016  . Status post lumbar spine surgery for decompression of spinal cord 08/07/2016  . Lumbosacral spondylosis with radiculopathy 08/01/2016  . Preoperative clearance 07/05/2016  . Cough 05/26/2016  . Weakness 04/26/2016  . Nausea without vomiting 04/26/2016  . Atrial fibrillation with RVR ([place]) 04/15/2016  . DOE (dyspnea on exertion) 03/20/2016  . Acute on chronic diastolic congestive heart failure ([place])   . Chronic kidney disease, stage III (moderate) 02/17/2016  . Essential hypertension 02/17/2016  . COPD exacerbation ([place]) 02/17/2016  . Acute bronchitis 12/18/2015  . CAD in native artery   . Chest pain at rest 10/15/2015  . Chronic low back pain 10/15/2015  . Hyperglycemia 10/15/2015  . Thrombocytosis ([place]) 10/15/2015  . Atypical chest pain 10/15/2015  . Depression   . Anxiety   . Gastroesophageal reflux disease without esophagitis   . Mild cognitive impairment 10/14/2015  . Iron deficiency anemia due to chronic blood loss   . Meningitis 10/01/2015  . Coronary artery disease due to lipid rich plaque   . NSVT (nonsustained ventricular tachycardia) ([place])   . PAF (paroxysmal atrial fibrillation) ([place])   . CSF leak 09/27/2015  . Spondylolisthesis of lumbar region 09/24/2015  . Gastric polyp   . History of colonic polyps   . Chronic diarrhea   . Esophageal dysphagia 04/01/2015  . PAOD (peripheral arterial occlusive disease) ([place]) 03/05/2015  . Pain in the  chest   . Hyperlipidemia   . Weight gain 08/12/2014  . Anemia 08/07/2014  . Hemorrhoids 08/06/2014  . CHF (congestive heart failure) ([place]) 07/29/2014  . Rectal bleeding 07/06/2014  . Constipation 07/06/2014  . Lower extremity edema 11/24/2013  . Acute kidney failure ([place]) 11/24/2013  . Other and unspecified angina pectoris 05/14/2013  . Hematochezia 02/13/2011  . Abdominal pain 02/13/2011  . Major depression ([place]) 09/28/2010  . FATIGUE 04/13/2009  . Chronic diastolic heart failure ([place]) 11/30/2008  . DYSPNEA 11/30/2008  . CAD, NATIVE VESSEL - PCI + DES to left circumflex 05/14/13 11/27/2008  . Peripheral vascular disease ([place]) 11/27/2008  . PANIC ATTACK 02/28/2008  . MI 02/28/2008  . Internal hemorrhoids 02/28/2008  . COLITIS 02/28/2008  . Dysphagia 02/28/2008   PCP:  Rosalee Kaufman, PA-C Pharmacy:   [id number] [place] [id number]: [phone]: [phone]    Social Determinants of Health (SDOH) Interventions    Readmission Risk Interventions Readmission Risk Prevention Plan 09/21/2019  Transportation Screening Complete  Home Care Screening Complete  Medication Review (RN CM) Complete  Some recent data might be hidden

## 2019-09-22 LAB — MAGNESIUM: Magnesium: 1.8 mg/dL (ref 1.7–2.4)

## 2019-09-22 LAB — TROPONIN I (HIGH SENSITIVITY)
Troponin I (High Sensitivity): 6 ng/L (ref <18)
Troponin I (High Sensitivity): 5 ng/L (ref <18)

## 2019-09-22 LAB — BASIC METABOLIC PANEL
Anion gap: 9 (ref 5–15)
BUN: 24 mg/dL — ABNORMAL HIGH (ref 8–23)
CO2: 25 mmol/L (ref 22–32)
Calcium: 8.4 mg/dL — ABNORMAL LOW (ref 8.9–10.3)
Chloride: 99 mmol/L (ref 98–111)
Creatinine, Ser: 1.58 mg/dL — ABNORMAL HIGH (ref 0.44–1.00)
GFR calc Af Amer: 34 mL/min — ABNORMAL LOW (ref >60)
GFR calc non Af Amer: 30 mL/min — ABNORMAL LOW (ref >60)
Glucose, Bld: 109 mg/dL — ABNORMAL HIGH (ref 70–99)
Potassium: 5.6 mmol/L — ABNORMAL HIGH (ref 3.5–5.1)
Sodium: 133 mmol/L — ABNORMAL LOW (ref 135–145)

## 2019-09-22 LAB — URINE CULTURE: Culture: <10000 — AB

## 2019-09-22 MED ORDER — SODIUM ZIRCONIUM CYCLOSILICATE 10 G PO PACK
10.0000 g | PACK | Freq: Every day | ORAL | Status: DC
  Administered 2019-09-22: 10 g via ORAL
  Filled 2019-09-22 (×2): qty 1

## 2019-09-22 MED ORDER — DILTIAZEM HCL ER COATED BEADS 120 MG PO CP24
120.0000 mg | ORAL_CAPSULE | Freq: Every day | ORAL | Status: DC
  Administered 2019-09-22 – 2019-09-23 (×2): 120 mg via ORAL
  Filled 2019-09-22 (×2): qty 1

## 2019-09-22 NOTE — Care Management Important Message (Signed)
Important Message  Patient Details  Name: [name] MRN: [id number] Date of Birth: 09/30/1932   Medicare Important Message Given:  Yes     Tommy Medal 09/22/2019, 4:36 PM

## 2019-09-22 NOTE — TOC Progression Note (Signed)
Transition of Care [facility]) - Progression Note    Patient Details  Name: CABRIA MICALIZZI MRN: [id number] Date of Birth: 09-23-1932  Transition of Care [facility]) CM/SW Contact  Salome Arnt, [place] Phone Number: 09/22/2019, 10:45 AM  Clinical Narrative:  LCSW notified Georgina Snell with [facility] that d/c anticipated for tomorrow. Home health orders needed at d/c.      Expected Discharge Plan: [place] Barriers to Discharge: Continued Medical Work up  Expected Discharge Plan and Services Expected Discharge Plan: [place] In-house Referral: Clinical Social Work   Post Acute Care Choice: [place] arrangements for the past 2 months: [place]: PT [place]: [employer] Date [place]: 09/21/19 Time [place]: 1443 Representative spoke with at [place]: Cindie   Social Determinants of Health ([place]) Interventions    Readmission Risk Interventions Readmission Risk Prevention Plan 09/21/2019  Transportation Screening Complete  Home Care Screening Complete  Medication Review (RN CM) Complete  Some recent data might be hidden

## 2019-09-22 NOTE — Progress Notes (Signed)
PROGRESS NOTE  [name] ION:[id number] DOB: 03-16-1933 DOA: 09/19/2019 PCP: Rosalee Kaufman, PA-C Brief History:  86 year old female with a history of persistent atrial fibrillation, anxiety, hypertension, COPD, coronary artery disease, hyperlipidemia, systolic and diastolic CHF, hyperlipidemia, CKD stage III, presenting with 3-day history of intermittent chest discomfort, heart palpitations with a feeling of heart racing and shortness of breath.  Patient denied any fevers, chills, nausea, vomiting, diarrhea, abdominal pain.  She did have some dysuria without any hematuria. The patient underwent TEEcardioversion on 10/04/2017 for her atrial fibrillation. She stayed in sinus rhythm for 3 days before reverting back to atrial fibrillation.  In addition, the patient recently had an event monitor that was interpreted on 05/12/2019 which showed normal sinus rhythm without any atrial fibrillation but showed NSVT up to 6 beats.  There was no pauses. In the emergency department, the patient was afebrile hemodynamically stable.  Oxygen saturation was 90% room air.  Serum creatinine was 1.36.  WBC 8.9, hemoglobin 9.9, platelets 320,000.  Chest x-ray showed increased interstitial markings.  BNP was 788.  TSH was 3.127.  The patient was started on intravenous furosemide.  Assessment/Plan: Acute on chronic systolic and diastolic CHF -05/21/4008 echo EF 30-35% -10/02/2018 echo EF 60 to 65%, impaired relaxation -09/20/19--EF 60-65%, no WMA, trivial MR/TR -Continue IV furosemide -Daily weights -Accurate I's and O's   Atrial fibrillation with RVR, persistent -Continue apixaban -Restart metoprolol succinate -Wean off diltiazem drip>>po diltiazem  CKD stage IIIb -Baseline creatinine 1.1-1.4 -Monitor with diuresis  COPD -Stable presently without bronchospasm -Continue duo nebs  Coronary artery disease -Continue metoprolol succinate, Imdur -12/16/15 showed 20% stenosis in the proximal  LAD and in the inferior branch of the first diagonal with a widely patent previously placed circumflex stent. RCA was small and nondominant with 80% ostial stenosis. Medical therapy was recommended. -recheck troponin due to chest tight  Anxiety/depression -Continue Xanax, Cymbalta  Essential hypertension -Continue metoprolol succinate and Imdur  Hypothyroidism -Continue levothyroxine -TSH 3.127  Hyperkalemia -Lokelma -stop KCl    Status is: inpatient  The patient will require care spanning > 2 midnights and should be moved to inpatient because: IV treatments appropriate due to intensity of illness or inability to take PO  Hyperkalemic with chest pain   Dispo: The patient is from: Home  Anticipated d/c is to: Home  Anticipated d/c date is:  6/8  Patient currently is not medically stable to d/c.        Family Communication: No  Family at bedside  Consultants:  none  Code Status:  FULL   DVT Prophylaxis:  apixaban   Procedures: As Listed in Progress Note Above  Antibiotics: None   Subjective: Overall feeling better.  Still having intermittent chest discomfort.  Denies sob, n/v/d, abd pain, fc  Objective: Vitals:   09/22/19 0400 09/22/19 0500 09/22/19 0600 09/22/19 0700  BP: (!) 121/42 (!) 128/46 129/76 109/86  Pulse: 90 (!) 40 (!) 56 (!) 41  Resp: 20 (!) 25 18 20   Temp: 98 F (36.7 C)     TempSrc: Oral     SpO2: 99% 94% 92% 95%  Weight:      Height:        Intake/Output Summary (Last 24 hours) at 09/22/2019 0845 Last data filed at 09/22/2019 0615 Gross per 24 hour  Intake 960 ml  Output 1350 ml  Net -390 ml   Weight change:  Exam:   General:  Pt is alert,  follows commands appropriately, not in acute distress  HEENT: No icterus, No thrush, No neck mass, [place]/AT  Cardiovascular: RRR, S1/S2, no rubs, no gallops  Respiratory: CTA bilaterally, no wheezing, no crackles, no  rhonchi  Abdomen: Soft/+BS, non tender, non distended, no guarding  Extremities: No edema, No lymphangitis, No petechiae, No rashes, no synovitis   Data Reviewed: I have personally reviewed following labs and imaging studies Basic Metabolic Panel: Recent Labs  Lab 09/19/19 1315 09/20/19 0419 09/21/19 0312 09/22/19 0349  NA 135 137 137 133*  K 3.9 4.3 4.9 5.6*  CL 102 101 101 99  CO2 22 25 28 25   GLUCOSE 171* 119* 126* 109*  BUN 19 20 22  24*  CREATININE 1.36* 1.45* 1.53* 1.58*  CALCIUM 8.7* 8.5* 8.4* 8.4*  MG  --   --  1.8 1.8   Liver Function Tests: Recent Labs  Lab 09/19/19 1315  AST 20  ALT 13  ALKPHOS 80  BILITOT 0.5  PROT 6.9  ALBUMIN 3.6   No results for input(s): LIPASE, AMYLASE in the last 168 hours. No results for input(s): AMMONIA in the last 168 hours. Coagulation Profile: No results for input(s): INR, PROTIME in the last 168 hours. CBC: Recent Labs  Lab 09/19/19 1315 09/20/19 0419  WBC 8.9 6.2  HGB 10.9* 10.9*  HCT 34.8* 35.0*  MCV 87.7 86.6  PLT 328 347   Cardiac Enzymes: No results for input(s): CKTOTAL, CKMB, CKMBINDEX, TROPONINI in the last 168 hours. BNP: Invalid input(s): POCBNP CBG: No results for input(s): GLUCAP in the last 168 hours. HbA1C: No results for input(s): HGBA1C in the last 72 hours. Urine analysis:    Component Value Date/Time   COLORURINE STRAW (A) 09/20/2019 0922   APPEARANCEUR CLEAR 09/20/2019 0922   LABSPEC 1.008 09/20/2019 0922   PHURINE 5.0 09/20/2019 0922   GLUCOSEU NEGATIVE 09/20/2019 0922   HGBUR NEGATIVE 09/20/2019 0922   BILIRUBINUR NEGATIVE 09/20/2019 0922   KETONESUR NEGATIVE 09/20/2019 0922   PROTEINUR NEGATIVE 09/20/2019 0922   UROBILINOGEN 0.2 02/21/2015 2028   NITRITE NEGATIVE 09/20/2019 0922   LEUKOCYTESUR NEGATIVE 09/20/2019 0922   Sepsis Labs: @LABRCNTIP (procalcitonin:4,lacticidven:4) ) Recent Results (from the past 240 hour(s))  SARS Coronavirus 2 by RT PCR (hospital order, performed  in [facility] hospital lab) Nasopharyngeal Nasopharyngeal Swab     Status: None   Collection Time: 09/19/19  6:10 PM   Specimen: Nasopharyngeal Swab  Result Value Ref Range Status   SARS Coronavirus 2 NEGATIVE NEGATIVE Final    Comment: (NOTE) SARS-CoV-2 target nucleic acids are NOT DETECTED. The SARS-CoV-2 RNA is generally detectable in upper and lower respiratory specimens during the acute phase of infection. The lowest concentration of SARS-CoV-2 viral copies this assay can detect is 250 copies / mL. A negative result does not preclude SARS-CoV-2 infection and should not be used as the sole basis for treatment or other patient management decisions.  A negative result may occur with improper specimen collection / handling, submission of specimen other than nasopharyngeal swab, presence of viral mutation(s) within the areas targeted by this assay, and inadequate number of viral copies (<250 copies / mL). A negative result must be combined with clinical observations, patient history, and epidemiological information. Fact Sheet for Patients:   StrictlyIdeas.no Fact Sheet for Healthcare Providers: BankingDealers.co.za This test is not yet approved or cleared  by the [place] FDA and has been authorized for detection and/or diagnosis of SARS-CoV-2 by FDA under an Emergency Use Authorization (EUA).  This EUA will remain  in effect (meaning this test can be used) for the duration of the COVID-19 declaration under Section 564(b)(1) of the Act, 21 U.S.C. section 360bbb-3(b)(1), unless the authorization is terminated or revoked sooner. Performed at [facility], [address], [place], [place] [address]   MRSA PCR Screening     Status: None   Collection Time: 09/19/19  9:00 PM   Specimen: Nasal Mucosa; Nasopharyngeal  Result Value Ref Range Status   MRSA by PCR NEGATIVE NEGATIVE Final    Comment:        The GeneXpert MRSA Assay  (FDA approved for NASAL specimens only), is one component of a comprehensive MRSA colonization surveillance program. It is not intended to diagnose MRSA infection nor to guide or monitor treatment for MRSA infections. Performed at [facility], [address], [place], [place] [address]   Culture, Urine     Status: Abnormal   Collection Time: 09/20/19  9:22 AM   Specimen: Urine, Clean Catch  Result Value Ref Range Status   Specimen Description   Final    URINE, CLEAN CATCH Performed at [facility], [address], [place], [place] [address]    Special Requests   Final    NONE Performed at [facility], [address] [place], [place] [address]    Culture (A)  Final    <10,000 COLONIES/mL INSIGNIFICANT GROWTH Performed at [place] [address], [place],  [address]    Report Status 09/22/2019 FINAL  Final     Scheduled Meds: . apixaban  2.5 mg Oral BID  . Chlorhexidine Gluconate Cloth  6 each Topical Daily  . diltiazem  30 mg Oral Q6H  . DULoxetine  60 mg Oral QHS  . furosemide  40 mg Oral Daily  . ipratropium-albuterol  3 mL Nebulization BID  . isosorbide mononitrate  60 mg Oral Daily  . levothyroxine  25 mcg Oral Q0600  . magnesium oxide  400 mg Oral Daily  . metoprolol succinate  75 mg Oral Daily  . pantoprazole  40 mg Oral Daily  . sodium chloride flush  3 mL Intravenous Q12H  . sodium zirconium cyclosilicate  10 g Oral Daily   Continuous Infusions: . sodium chloride      Procedures/Studies: DG Chest Port 1 View  Result Date: 09/19/2019 CLINICAL DATA:  Shortness of breath.  Chest pain. EXAM: PORTABLE CHEST 1 VIEW COMPARISON:  Chest x-ray 08/11/2019, 10/01/2018. Chest CT 05/07/2019 FINDINGS: Mediastinum and hilar structures normal. Heart size normal. Mild interstitial prominence particularly in the right base again noted. Superimposed mild right base alveolar infiltrate cannot be excluded. No pleural effusion or pneumothorax. IMPRESSION: Mild  interstitial prominence particular the right base again noted. Superimposed mild right base alveolar infiltrate cannot be excluded. Electronically Signed   By: Marcello Moores  Register   On: 09/19/2019 13:43   ECHOCARDIOGRAM COMPLETE  Result Date: 09/20/2019    ECHOCARDIOGRAM REPORT   Patient Name:   BUELAH RENNIE Date of Exam: 09/20/2019 Medical Rec #:  [id number]      Height:       61.0 in Accession #:    [id number]     Weight:       166.9 lb Date of Birth:  01/21/1933       BSA:          1.749 m Patient Age:    82 years       BP:           125/54 mmHg Patient Gender:  F              HR:           56 bpm. Exam Location:  [name] Procedure: 2D Echo, Cardiac Doppler and Color Doppler Indications:    Atrial fibrillation  History:        Patient has prior history of Echocardiogram examinations, most                 recent 10/02/2018. CHF, CAD and Previous Myocardial Infarction,                 COPD, Arrythmias:Atrial Fibrillation, Signs/Symptoms:Shortness                 of Breath; Risk Factors:Hypertension and Dyslipidemia.  Sonographer:    Dustin Flock RDCS Referring Phys: [phone] JACOB J STINSON  Sonographer Comments: Image acquisition challenging due to COPD. IMPRESSIONS  1. Left ventricular ejection fraction, by estimation, is 60 to 65%. The left ventricle has normal function. The left ventricle has no regional wall motion abnormalities. There is mild left ventricular hypertrophy. Left ventricular diastolic parameters are indeterminate.  2. Right ventricular systolic function is normal. The right ventricular size is normal. There is normal pulmonary artery systolic pressure.  3. The mitral valve is normal in structure. Trivial mitral valve regurgitation. No evidence of mitral stenosis.  4. The aortic valve was not well visualized. Aortic valve regurgitation is mild. No aortic stenosis is present. FINDINGS  Left Ventricle: Left ventricular ejection fraction, by estimation, is 60 to 65%. The left ventricle has normal  function. The left ventricle has no regional wall motion abnormalities. The left ventricular internal cavity size was normal in size. There is  mild left ventricular hypertrophy. Left ventricular diastolic parameters are indeterminate. Right Ventricle: The right ventricular size is normal. No increase in right ventricular wall thickness. Right ventricular systolic function is normal. There is normal pulmonary artery systolic pressure. The tricuspid regurgitant velocity is 2.12 m/s, and  with an assumed right atrial pressure of 3 mmHg, the estimated right ventricular systolic pressure is 41.7 mmHg. Left Atrium: Left atrial size was normal in size. Right Atrium: Right atrial size was normal in size. Pericardium: There is no evidence of pericardial effusion. Mitral Valve: The mitral valve is normal in structure. Trivial mitral valve regurgitation. No evidence of mitral valve stenosis. Tricuspid Valve: The tricuspid valve is normal in structure. Tricuspid valve regurgitation is trivial. No evidence of tricuspid stenosis. Aortic Valve: The aortic valve was not well visualized. Aortic valve regurgitation is mild. Aortic regurgitation PHT measures 567 msec. No aortic stenosis is present. Aortic valve mean gradient measures 3.7 mmHg. Aortic valve peak gradient measures 7.6 mmHg. Aortic valve area, by VTI measures 1.79 cm. Pulmonic Valve: The pulmonic valve was not well visualized. Pulmonic valve regurgitation is not visualized. No evidence of pulmonic stenosis. Aorta: The aortic root is normal in size and structure. IAS/Shunts: No atrial level shunt detected by color flow Doppler.  LEFT VENTRICLE PLAX 2D LVIDd:         3.74 cm  Diastology LVIDs:         2.52 cm  LV e' lateral:   7.62 cm/s LV PW:         1.15 cm  LV E/e' lateral: 12.9 LV IVS:        1.20 cm  LV e' medial:    6.31 cm/s LVOT diam:     2.10 cm  LV E/e' medial:  15.5 LV SV:  54 LV SV Index:   31 LVOT Area:     3.46 cm  RIGHT VENTRICLE RV Basal diam:   3.47 cm RV S prime:     11.30 cm/s TAPSE (M-mode): 2.6 cm LEFT ATRIUM             Index       RIGHT ATRIUM           Index LA diam:        4.10 cm 2.34 cm/m  RA Area:     13.80 cm LA Vol (A2C):   36.0 ml 20.58 ml/m RA Volume:   35.40 ml  20.24 ml/m LA Vol (A4C):   39.9 ml 22.81 ml/m LA Biplane Vol: 39.3 ml 22.47 ml/m  AORTIC VALVE AV Area (Vmax):    1.69 cm AV Area (Vmean):   1.61 cm AV Area (VTI):     1.79 cm AV Vmax:           137.68 cm/s AV Vmean:          90.933 cm/s AV VTI:            0.304 m AV Peak Grad:      7.6 mmHg AV Mean Grad:      3.7 mmHg LVOT Vmax:         67.20 cm/s LVOT Vmean:        42.300 cm/s LVOT VTI:          0.157 m LVOT/AV VTI ratio: 0.52 AI PHT:            567 msec  AORTA Ao Root diam: 2.90 cm MITRAL VALVE               TRICUSPID VALVE MV Area (PHT): 4.19 cm    TR Peak grad:   18.0 mmHg MV Decel Time: 181 msec    TR Vmax:        212.00 cm/s MV E velocity: 98.10 cm/s MV A velocity: 32.10 cm/s  SHUNTS MV E/A ratio:  3.06        Systemic VTI:  0.16 m                            Systemic Diam: 2.10 cm Carlyle Dolly MD Electronically signed by Carlyle Dolly MD Signature Date/Time: 09/20/2019/2:01:18 PM    Final     Orson Eva, DO  [facility]  If 7PM-7AM, please contact night-coverage [email or web address] Password [email or web address] 09/22/2019, 8:45 AM   LOS: 2 days

## 2019-09-23 LAB — BASIC METABOLIC PANEL
Anion gap: 9 (ref 5–15)
BUN: 24 mg/dL — ABNORMAL HIGH (ref 8–23)
CO2: 28 mmol/L (ref 22–32)
Calcium: 8.6 mg/dL — ABNORMAL LOW (ref 8.9–10.3)
Chloride: 99 mmol/L (ref 98–111)
Creatinine, Ser: 1.41 mg/dL — ABNORMAL HIGH (ref 0.44–1.00)
GFR calc Af Amer: 40 mL/min — ABNORMAL LOW (ref >60)
GFR calc non Af Amer: 34 mL/min — ABNORMAL LOW (ref >60)
Glucose, Bld: 117 mg/dL — ABNORMAL HIGH (ref 70–99)
Potassium: 4.2 mmol/L (ref 3.5–5.1)
Sodium: 136 mmol/L (ref 135–145)

## 2019-09-23 MED ORDER — DILTIAZEM HCL ER COATED BEADS 180 MG PO CP24: 180.0000 mg | ORAL_CAPSULE | Freq: Every day | ORAL | 1 refills | Status: DC

## 2019-09-23 MED ORDER — DILTIAZEM HCL ER COATED BEADS 180 MG PO CP24: 180.0000 mg | ORAL_CAPSULE | Freq: Every day | ORAL | Status: DC

## 2019-09-23 MED ORDER — FUROSEMIDE 40 MG PO TABS: 40.0000 mg | ORAL_TABLET | Freq: Every day | ORAL | 1 refills | Status: DC

## 2019-09-23 NOTE — Discharge Summary (Signed)
Physician Discharge Summary  [name] WLN:[id number] DOB: [birth year] DOA: 09/19/2019  PCP: Rosalee Kaufman, PA-C  Admit date: 09/19/2019 Discharge date: 09/23/2019  Admitted From: Home Disposition:  Home   Recommendations for Outpatient Follow-up:  1. Follow up with PCP in 1-2 weeks 2. Please obtain BMP/CBC in one week   Home Health:yes Equipment/Devices:HHPT  Discharge Condition: Stable CODE STATUS: FULL Diet recommendation: Heart Healthy    Brief/Interim Summary: 85 year old female with a history of persistent atrial fibrillation, anxiety, hypertension, COPD, coronary artery disease, hyperlipidemia, systolic and diastolic CHF, hyperlipidemia, CKD stage III, presenting with 3-day history of intermittent chest discomfort,heart palpitations with a feeling of heart racing and shortness of breath.Patient denied any fevers, chills, nausea, vomiting, diarrhea, abdominal pain.She did have some dysuria without any hematuria.The patient underwent TEEcardioversion on 10/04/2017 for her atrial fibrillation. She stayed in sinus rhythm for 3 days before reverting back to atrial fibrillation.In addition, the patient recently had an event monitor that was interpreted on 05/12/2019 which showed normal sinus rhythm without any atrial fibrillation but showed NSVT up to 6 beats. There was no pauses. In the emergency department, the patient was afebrile hemodynamically stable. Oxygen saturation was 90% room air. Serum creatinine was 1.36. WBC 8.9, hemoglobin 9.9, platelets 320,000. Chest x-ray showed increased interstitial markings. BNP was 788. TSH was 3.127. The patient was started on intravenous furosemide.  Discharge Diagnoses:  Acute on chronic systolic and diastolic CHF -10/18/812 echo EF 30-35% -10/02/2018 echo EF 60 to 65%, impaired relaxation -09/20/19--EF 60-65%, no WMA, trivial MR/TR -Continue IV furosemide>>>lasix 40 mg po daily -Daily weights -Accurate I's and  O's--incomplete   Atrial fibrillation with RVR, persistent -Continue apixaban -Restart metoprolol succinate -Wean off diltiazem drip>>po diltiazem CD 180 mg  CKD stage IIIb -Baseline creatinine 1.1-1.4 -Monitor with diuresis  COPD -Stable presently without bronchospasm -Continue duo nebs  Coronary artery disease -Continue metoprolol succinate, Imdur -12/16/15 showed 20% stenosis in the proximal LAD and in the inferior branch of the first diagonal with a widely patent previously placed circumflex stent. RCA was small and nondominant with 80% ostial stenosis. Medical therapy was recommended. -recheck troponin due to chest tight  Anxiety/depression -Continue Xanax, Cymbalta  Essential hypertension -Continue metoprolol succinate and Imdur  Hypothyroidism -Continue levothyroxine -TSH 3.127  Hyperkalemia -Lokelma x 1 -stop KCl -improved   Discharge Instructions   Allergies as of 09/23/2019      Reactions   Cephalosporins Diarrhea, Nausea Only   Lightheaded   Levaquin [levofloxacin In D5w] Swelling   Macrodantin [nitrofurantoin Macrocrystal] Swelling   Phenothiazines Anaphylaxis, Hives   Polysorbate Anaphylaxis   Prednisone Shortness Of Breath   Buspirone Itching   Cardura [doxazosin Mesylate] Itching   Codeine Itching   Acyclovir And Related Itching   Redness of skin   Colcrys [colchicine] Nausea Only   Prochlorperazine Other (See Comments)   "Upset stomach"   Ranexa [ranolazine]    Severe drop in BP   Atorvastatin Hives   Cramping; tolerates Crestor ok   Ofloxacin Rash   Other Itching, Rash   "WOOL"= make skin look like it has been burned   Penicillins Other (See Comments)   Causes redness all over. Has patient had a PCN reaction causing immediate rash, facial/tongue/throat swelling, SOB or lightheadedness with hypotension: No Has patient had a PCN reaction causing severe rash involving mucus membranes or skin necrosis: No Has patient had a PCN  reaction that required hospitalization No Has patient had a PCN reaction occurring within the last 10 years: No  If all of the above answers are "NO", then may proceed with Cephalosporin use.   Pimozide Hives, Itching      Medication List    STOP taking these medications   colchicine 0.6 MG tablet     TAKE these medications   acetaminophen 500 MG tablet Commonly known as: TYLENOL Take 500 mg by mouth every 6 (six) hours as needed for headache.   Allergy Relief 10 MG tablet Generic drug: loratadine Take 10 mg by mouth daily.   ALPRAZolam 0.25 MG tablet Commonly known as: XANAX Take 0.25 mg by mouth 2 (two) times daily as needed for anxiety.   apixaban 5 MG Tabs tablet Commonly known as: Eliquis Take 1 tablet (5 mg total) by mouth 2 (two) times daily.   diltiazem 180 MG 24 hr capsule Commonly known as: CARDIZEM CD Take 1 capsule (180 mg total) by mouth daily. Start taking on: September 24, 2019   DULoxetine 60 MG capsule Commonly known as: CYMBALTA Take 60 mg by mouth at bedtime.   fluticasone 0.05 % cream Commonly known as: CUTIVATE Apply 1 application topically daily.   fluticasone 50 MCG/ACT nasal spray Commonly known as: FLONASE Place 2 sprays into both nostrils 2 (two) times daily as needed for allergies.   furosemide 40 MG tablet Commonly known as: LASIX Take 1 tablet (40 mg total) by mouth daily. Start taking on: September 24, 2019 What changed: how much to take   ipratropium-albuterol 0.5-2.5 (3) MG/3ML Soln Commonly known as: DUONEB Take 3 mLs by nebulization every 6 (six) hours.   isosorbide mononitrate 60 MG 24 hr tablet Commonly known as: IMDUR Take 1 tablet (60 mg total) by mouth daily.   levothyroxine 25 MCG tablet Commonly known as: SYNTHROID Take 1 tablet by mouth daily.   magnesium oxide 400 (241.3 Mg) MG tablet Commonly known as: MAG-OX TAKE ONE TABLET BY MOUTH DAILY.   methocarbamol 500 MG tablet Commonly known as: ROBAXIN Take 500 mg by mouth  every 6 (six) hours as needed for muscle spasms.   metoprolol succinate 50 MG 24 hr tablet Commonly known as: TOPROL-XL Take 1 1/2 tabs by mouth daily   multivitamin with minerals Tabs tablet Take 1 tablet by mouth daily. Centrum   nitroGLYCERIN 0.4 MG SL tablet Commonly known as: NITROSTAT Place 1 tablet (0.4 mg total) under the tongue every 5 (five) minutes as needed for chest pain. Reported on 08/04/2015   pantoprazole 40 MG tablet Commonly known as: PROTONIX Take 40 mg by mouth daily.   potassium chloride 10 MEQ tablet Commonly known as: KLOR-CON Take 1 tablet (10 mEq total) by mouth daily.   sodium chloride 0.65 % Soln nasal spray Commonly known as: OCEAN Place 1 spray into both nostrils as needed (allergies).       Allergies  Allergen Reactions  . Cephalosporins Diarrhea and Nausea Only    Lightheaded  . Levaquin [Levofloxacin In D5w] Swelling  . Macrodantin [Nitrofurantoin Macrocrystal] Swelling  . Phenothiazines Anaphylaxis and Hives  . Polysorbate Anaphylaxis  . Prednisone Shortness Of Breath  . Buspirone Itching  . Cardura [Doxazosin Mesylate] Itching  . Codeine Itching  . Acyclovir And Related Itching    Redness of skin  . Colcrys [Colchicine] Nausea Only  . Prochlorperazine Other (See Comments)    "Upset stomach"  . Ranexa [Ranolazine]     Severe drop in BP  . Atorvastatin Hives    Cramping; tolerates Crestor ok  . Ofloxacin Rash  . Other Itching and Rash    "  WOOL"= make skin look like it has been burned  . Penicillins Other (See Comments)    Causes redness all over. Has patient had a PCN reaction causing immediate rash, facial/tongue/throat swelling, SOB or lightheadedness with hypotension: No Has patient had a PCN reaction causing severe rash involving mucus membranes or skin necrosis: No Has patient had a PCN reaction that required hospitalization No Has patient had a PCN reaction occurring within the last 10 years: No If all of the above  answers are "NO", then may proceed with Cephalosporin use.   . Pimozide Hives and Itching    Consultations:  none   Procedures/Studies: DG Chest Port 1 View  Result Date: 09/19/2019 CLINICAL DATA:  Shortness of breath.  Chest pain. EXAM: PORTABLE CHEST 1 VIEW COMPARISON:  Chest x-ray 08/11/2019, 10/01/2018. Chest CT 05/07/2019 FINDINGS: Mediastinum and hilar structures normal. Heart size normal. Mild interstitial prominence particularly in the right base again noted. Superimposed mild right base alveolar infiltrate cannot be excluded. No pleural effusion or pneumothorax. IMPRESSION: Mild interstitial prominence particular the right base again noted. Superimposed mild right base alveolar infiltrate cannot be excluded. Electronically Signed   By: Marcello Moores  Register   On: 09/19/2019 13:43   ECHOCARDIOGRAM COMPLETE  Result Date: 09/20/2019    ECHOCARDIOGRAM REPORT   Patient Name:   [name] Date of Exam: 09/20/2019 Medical Rec #:  [id number]      Height:       61.0 in Accession #:    [id number]     Weight:       166.9 lb Date of Birth:  Aug 07, 1932       BSA:          1.749 m Patient Age:    53 years       BP:           125/54 mmHg Patient Gender: F              HR:           56 bpm. Exam Location:  [name] Procedure: 2D Echo, Cardiac Doppler and Color Doppler Indications:    Atrial fibrillation  History:        Patient has prior history of Echocardiogram examinations, most                 recent 10/02/2018. CHF, CAD and Previous Myocardial Infarction,                 COPD, Arrythmias:Atrial Fibrillation, Signs/Symptoms:Shortness                 of Breath; Risk Factors:Hypertension and Dyslipidemia.  Sonographer:    Dustin Flock RDCS Referring Phys: [phone] JACOB J STINSON  Sonographer Comments: Image acquisition challenging due to COPD. IMPRESSIONS  1. Left ventricular ejection fraction, by estimation, is 60 to 65%. The left ventricle has normal function. The left ventricle has no regional wall motion  abnormalities. There is mild left ventricular hypertrophy. Left ventricular diastolic parameters are indeterminate.  2. Right ventricular systolic function is normal. The right ventricular size is normal. There is normal pulmonary artery systolic pressure.  3. The mitral valve is normal in structure. Trivial mitral valve regurgitation. No evidence of mitral stenosis.  4. The aortic valve was not well visualized. Aortic valve regurgitation is mild. No aortic stenosis is present. FINDINGS  Left Ventricle: Left ventricular ejection fraction, by estimation, is 60 to 65%. The left ventricle has normal function. The left ventricle has no regional wall motion  abnormalities. The left ventricular internal cavity size was normal in size. There is  mild left ventricular hypertrophy. Left ventricular diastolic parameters are indeterminate. Right Ventricle: The right ventricular size is normal. No increase in right ventricular wall thickness. Right ventricular systolic function is normal. There is normal pulmonary artery systolic pressure. The tricuspid regurgitant velocity is 2.12 m/s, and  with an assumed right atrial pressure of 3 mmHg, the estimated right ventricular systolic pressure is 56.8 mmHg. Left Atrium: Left atrial size was normal in size. Right Atrium: Right atrial size was normal in size. Pericardium: There is no evidence of pericardial effusion. Mitral Valve: The mitral valve is normal in structure. Trivial mitral valve regurgitation. No evidence of mitral valve stenosis. Tricuspid Valve: The tricuspid valve is normal in structure. Tricuspid valve regurgitation is trivial. No evidence of tricuspid stenosis. Aortic Valve: The aortic valve was not well visualized. Aortic valve regurgitation is mild. Aortic regurgitation PHT measures 567 msec. No aortic stenosis is present. Aortic valve mean gradient measures 3.7 mmHg. Aortic valve peak gradient measures 7.6 mmHg. Aortic valve area, by VTI measures 1.79 cm. Pulmonic  Valve: The pulmonic valve was not well visualized. Pulmonic valve regurgitation is not visualized. No evidence of pulmonic stenosis. Aorta: The aortic root is normal in size and structure. IAS/Shunts: No atrial level shunt detected by color flow Doppler.  LEFT VENTRICLE PLAX 2D LVIDd:         3.74 cm  Diastology LVIDs:         2.52 cm  LV e' lateral:   7.62 cm/s LV PW:         1.15 cm  LV E/e' lateral: 12.9 LV IVS:        1.20 cm  LV e' medial:    6.31 cm/s LVOT diam:     2.10 cm  LV E/e' medial:  15.5 LV SV:         54 LV SV Index:   31 LVOT Area:     3.46 cm  RIGHT VENTRICLE RV Basal diam:  3.47 cm RV S prime:     11.30 cm/s TAPSE (M-mode): 2.6 cm LEFT ATRIUM             Index       RIGHT ATRIUM           Index LA diam:        4.10 cm 2.34 cm/m  RA Area:     13.80 cm LA Vol (A2C):   36.0 ml 20.58 ml/m RA Volume:   35.40 ml  20.24 ml/m LA Vol (A4C):   39.9 ml 22.81 ml/m LA Biplane Vol: 39.3 ml 22.47 ml/m  AORTIC VALVE AV Area (Vmax):    1.69 cm AV Area (Vmean):   1.61 cm AV Area (VTI):     1.79 cm AV Vmax:           137.68 cm/s AV Vmean:          90.933 cm/s AV VTI:            0.304 m AV Peak Grad:      7.6 mmHg AV Mean Grad:      3.7 mmHg LVOT Vmax:         67.20 cm/s LVOT Vmean:        42.300 cm/s LVOT VTI:          0.157 m LVOT/AV VTI ratio: 0.52 AI PHT:            567 msec  AORTA Ao Root diam: 2.90 cm MITRAL VALVE               TRICUSPID VALVE MV Area (PHT): 4.19 cm    TR Peak grad:   18.0 mmHg MV Decel Time: 181 msec    TR Vmax:        212.00 cm/s MV E velocity: 98.10 cm/s MV A velocity: 32.10 cm/s  SHUNTS MV E/A ratio:  3.06        Systemic VTI:  0.16 m                            Systemic Diam: 2.10 cm Carlyle Dolly MD Electronically signed by Carlyle Dolly MD Signature Date/Time: 09/20/2019/2:01:18 PM    Final          Discharge Exam: Vitals:   09/23/19 0746 09/23/19 1042  BP:  130/67  Pulse:  84  Resp:  17  Temp:    SpO2: 100% 98%   Vitals:   09/23/19 0500 09/23/19 0607  09/23/19 0746 09/23/19 1042  BP:  (!) 135/56  130/67  Pulse:  97  84  Resp:    17  Temp:  97.8 F (36.6 C)    TempSrc:  Oral    SpO2:  100% 100% 98%  Weight: 75.1 kg     Height:        General: Pt is alert, awake, not in acute distress Cardiovascular: IRRR, S1/S2 +, no rubs, no gallops Respiratory: CTA bilaterally, no wheezing, no rhonchi Abdominal: Soft, NT, ND, bowel sounds + Extremities: no edema, no cyanosis   The results of significant diagnostics from this hospitalization (including imaging, microbiology, ancillary and laboratory) are listed below for reference.    Significant Diagnostic Studies: DG Chest Port 1 View  Result Date: 09/19/2019 CLINICAL DATA:  Shortness of breath.  Chest pain. EXAM: PORTABLE CHEST 1 VIEW COMPARISON:  Chest x-ray 08/11/2019, 10/01/2018. Chest CT 05/07/2019 FINDINGS: Mediastinum and hilar structures normal. Heart size normal. Mild interstitial prominence particularly in the right base again noted. Superimposed mild right base alveolar infiltrate cannot be excluded. No pleural effusion or pneumothorax. IMPRESSION: Mild interstitial prominence particular the right base again noted. Superimposed mild right base alveolar infiltrate cannot be excluded. Electronically Signed   By: Marcello Moores  Register   On: 09/19/2019 13:43   ECHOCARDIOGRAM COMPLETE  Result Date: 09/20/2019    ECHOCARDIOGRAM REPORT   Patient Name:   [name] Date of Exam: 09/20/2019 Medical Rec #:  [id number]      Height:       61.0 in Accession #:    [id number]     Weight:       166.9 lb Date of Birth:  12-26-1932       BSA:          1.749 m Patient Age:    27 years       BP:           125/54 mmHg Patient Gender: F              HR:           56 bpm. Exam Location:  [name] Procedure: 2D Echo, Cardiac Doppler and Color Doppler Indications:    Atrial fibrillation  History:        Patient has prior history of Echocardiogram examinations, most                 recent 10/02/2018. CHF,  CAD and  Previous Myocardial Infarction,                 COPD, Arrythmias:Atrial Fibrillation, Signs/Symptoms:Shortness                 of Breath; Risk Factors:Hypertension and Dyslipidemia.  Sonographer:    Dustin Flock RDCS Referring Phys: [id number] JACOB J STINSON  Sonographer Comments: Image acquisition challenging due to COPD. IMPRESSIONS  1. Left ventricular ejection fraction, by estimation, is 60 to 65%. The left ventricle has normal function. The left ventricle has no regional wall motion abnormalities. There is mild left ventricular hypertrophy. Left ventricular diastolic parameters are indeterminate.  2. Right ventricular systolic function is normal. The right ventricular size is normal. There is normal pulmonary artery systolic pressure.  3. The mitral valve is normal in structure. Trivial mitral valve regurgitation. No evidence of mitral stenosis.  4. The aortic valve was not well visualized. Aortic valve regurgitation is mild. No aortic stenosis is present. FINDINGS  Left Ventricle: Left ventricular ejection fraction, by estimation, is 60 to 65%. The left ventricle has normal function. The left ventricle has no regional wall motion abnormalities. The left ventricular internal cavity size was normal in size. There is  mild left ventricular hypertrophy. Left ventricular diastolic parameters are indeterminate. Right Ventricle: The right ventricular size is normal. No increase in right ventricular wall thickness. Right ventricular systolic function is normal. There is normal pulmonary artery systolic pressure. The tricuspid regurgitant velocity is 2.12 m/s, and  with an assumed right atrial pressure of 3 mmHg, the estimated right ventricular systolic pressure is 29.7 mmHg. Left Atrium: Left atrial size was normal in size. Right Atrium: Right atrial size was normal in size. Pericardium: There is no evidence of pericardial effusion. Mitral Valve: The mitral valve is normal in structure. Trivial mitral valve  regurgitation. No evidence of mitral valve stenosis. Tricuspid Valve: The tricuspid valve is normal in structure. Tricuspid valve regurgitation is trivial. No evidence of tricuspid stenosis. Aortic Valve: The aortic valve was not well visualized. Aortic valve regurgitation is mild. Aortic regurgitation PHT measures 567 msec. No aortic stenosis is present. Aortic valve mean gradient measures 3.7 mmHg. Aortic valve peak gradient measures 7.6 mmHg. Aortic valve area, by VTI measures 1.79 cm. Pulmonic Valve: The pulmonic valve was not well visualized. Pulmonic valve regurgitation is not visualized. No evidence of pulmonic stenosis. Aorta: The aortic root is normal in size and structure. IAS/Shunts: No atrial level shunt detected by color flow Doppler.  LEFT VENTRICLE PLAX 2D LVIDd:         3.74 cm  Diastology LVIDs:         2.52 cm  LV e' lateral:   7.62 cm/s LV PW:         1.15 cm  LV E/e' lateral: 12.9 LV IVS:        1.20 cm  LV e' medial:    6.31 cm/s LVOT diam:     2.10 cm  LV E/e' medial:  15.5 LV SV:         54 LV SV Index:   31 LVOT Area:     3.46 cm  RIGHT VENTRICLE RV Basal diam:  3.47 cm RV S prime:     11.30 cm/s TAPSE (M-mode): 2.6 cm LEFT ATRIUM             Index       RIGHT ATRIUM           Index LA diam:  4.10 cm 2.34 cm/m  RA Area:     13.80 cm LA Vol (A2C):   36.0 ml 20.58 ml/m RA Volume:   35.40 ml  20.24 ml/m LA Vol (A4C):   39.9 ml 22.81 ml/m LA Biplane Vol: 39.3 ml 22.47 ml/m  AORTIC VALVE AV Area (Vmax):    1.69 cm AV Area (Vmean):   1.61 cm AV Area (VTI):     1.79 cm AV Vmax:           137.68 cm/s AV Vmean:          90.933 cm/s AV VTI:            0.304 m AV Peak Grad:      7.6 mmHg AV Mean Grad:      3.7 mmHg LVOT Vmax:         67.20 cm/s LVOT Vmean:        42.300 cm/s LVOT VTI:          0.157 m LVOT/AV VTI ratio: 0.52 AI PHT:            567 msec  AORTA Ao Root diam: 2.90 cm MITRAL VALVE               TRICUSPID VALVE MV Area (PHT): 4.19 cm    TR Peak grad:   18.0 mmHg MV Decel  Time: 181 msec    TR Vmax:        212.00 cm/s MV E velocity: 98.10 cm/s MV A velocity: 32.10 cm/s  SHUNTS MV E/A ratio:  3.06        Systemic VTI:  0.16 m                            Systemic Diam: 2.10 cm Carlyle Dolly MD Electronically signed by Carlyle Dolly MD Signature Date/Time: 09/20/2019/2:01:18 PM    Final      Microbiology: Recent Results (from the past 240 hour(s))  SARS Coronavirus 2 by RT PCR (hospital order, performed in [place] hospital lab) Nasopharyngeal Nasopharyngeal Swab     Status: None   Collection Time: 09/19/19  6:10 PM   Specimen: Nasopharyngeal Swab  Result Value Ref Range Status   SARS Coronavirus 2 NEGATIVE NEGATIVE Final    Comment: (NOTE) SARS-CoV-2 target nucleic acids are NOT DETECTED. The SARS-CoV-2 RNA is generally detectable in upper and lower respiratory specimens during the acute phase of infection. The lowest concentration of SARS-CoV-2 viral copies this assay can detect is 250 copies / mL. A negative result does not preclude SARS-CoV-2 infection and should not be used as the sole basis for treatment or other patient management decisions.  A negative result may occur with improper specimen collection / handling, submission of specimen other than nasopharyngeal swab, presence of viral mutation(s) within the areas targeted by this assay, and inadequate number of viral copies (<250 copies / mL). A negative result must be combined with clinical observations, patient history, and epidemiological information. Fact Sheet for Patients:   StrictlyIdeas.no Fact Sheet for Healthcare Providers: BankingDealers.co.za This test is not yet approved or cleared  by the [place] FDA and has been authorized for detection and/or diagnosis of SARS-CoV-2 by FDA under an Emergency Use Authorization (EUA).  This EUA will remain in effect (meaning this test can be used) for the duration of the COVID-19 declaration  under Section 564(b)(1) of the Act, 21 U.S.C. section 360bbb-3(b)(1), unless the authorization is terminated or revoked sooner.  Performed at [facility], [address], [place], [place] [address]   MRSA PCR Screening     Status: None   Collection Time: 09/19/19  9:00 PM   Specimen: Nasal Mucosa; Nasopharyngeal  Result Value Ref Range Status   MRSA by PCR NEGATIVE NEGATIVE Final    Comment:        The GeneXpert MRSA Assay (FDA approved for NASAL specimens only), is one component of a comprehensive MRSA colonization surveillance program. It is not intended to diagnose MRSA infection nor to guide or monitor treatment for MRSA infections. Performed at [facility], [address] [place], [place] [address]   Culture, Urine     Status: Abnormal   Collection Time: 09/20/19  9:22 AM   Specimen: Urine, Clean Catch  Result Value Ref Range Status   Specimen Description   Final    URINE, CLEAN CATCH Performed at [facility], [address], [place], [place] [address]    Special Requests   Final    NONE Performed at [facility], [address], [place], [place] [address]    Culture (A)  Final    <10,000 COLONIES/mL INSIGNIFICANT GROWTH Performed at [facility] [address], [place],  [address]    Report Status 09/22/2019 FINAL  Final     Labs: Basic Metabolic Panel: Recent Labs  Lab 09/19/19 1315 09/19/19 1315 09/20/19 0419 09/20/19 0419 09/21/19 0312 09/21/19 0312 09/22/19 0349 09/23/19 0429  NA 135  --  137  --  137  --  133* 136  K 3.9   < > 4.3   < > 4.9   < > 5.6* 4.2  CL 102  --  101  --  101  --  99 99  CO2 22  --  25  --  28  --  25 28  GLUCOSE 171*  --  119*  --  126*  --  109* 117*  BUN 19  --  20  --  22  --  24* 24*  CREATININE 1.36*  --  1.45*  --  1.53*  --  1.58* 1.41*  CALCIUM 8.7*  --  8.5*  --  8.4*  --  8.4* 8.6*  MG  --   --   --   --  1.8  --  1.8  --    < > = values in this interval not displayed.   Liver Function  Tests: Recent Labs  Lab 09/19/19 1315  AST 20  ALT 13  ALKPHOS 80  BILITOT 0.5  PROT 6.9  ALBUMIN 3.6   No results for input(s): LIPASE, AMYLASE in the last 168 hours. No results for input(s): AMMONIA in the last 168 hours. CBC: Recent Labs  Lab 09/19/19 1315 09/20/19 0419  WBC 8.9 6.2  HGB 10.9* 10.9*  HCT 34.8* 35.0*  MCV 87.7 86.6  PLT 328 347   Cardiac Enzymes: No results for input(s): CKTOTAL, CKMB, CKMBINDEX, TROPONINI in the last 168 hours. BNP: Invalid input(s): POCBNP CBG: No results for input(s): GLUCAP in the last 168 hours.  Time coordinating discharge:  36 minutes  Signed:  Orson Eva, DO [facility] Pager: [phone] 09/23/2019, 1:33 PM

## 2019-09-23 NOTE — Progress Notes (Signed)
Physical Therapy Treatment Patient Details Name: [name] MRN: [id number] DOB: 11/10/1932 Today's Date: 09/23/2019    History of Present Illness 86 year old female with a history of persistent atrial fibrillation, anxiety, hypertension, COPD, coronary artery disease, hyperlipidemia, systolic and diastolic CHF, hyperlipidemia, CKD stage III, presenting with 3-day history of intermittent chest discomfort, heart palpitations with a feeling of heart racing and shortness of breath.  Patient denied any fevers, chills, nausea, vomiting, diarrhea, abdominal pain.  She did have some dysuria without any hematuria.   The patient underwent TEE cardioversion on 10/04/2017 for her atrial fibrillation.  She stayed in sinus rhythm for 3 days before reverting back to atrial fibrillation.  In addition, the patient recently had an event monitor that was interpreted on 05/12/2019 which showed normal sinus rhythm without any atrial fibrillation but showed NSVT up to 6 beats.  There was no pauses.    PT Comments    Patient is making good progress with PT.  From a mobility standpoint anticipate patient will be ready for DC home 09/23/2019.  Patient exhibited DOE/SOB performing seated therapeutic exercises. Patient cues to slow down with rest of exercises and with ambulation and take her time. Patient tolerated remainder of session with less complaints of DOE/SOB and ambulated with RW further (125'). Patient would continue to benefit from PT in current venue and recommended venue below post discharge from acute care.    Follow Up Recommendations  Home health PT     Equipment Recommendations       Recommendations for Other Services       Precautions / Restrictions Precautions Precautions: Fall Restrictions Weight Bearing Restrictions: No    Mobility  Bed Mobility               General bed mobility comments: patient received in recliner and in recliner at end of session  Transfers Overall transfer  level: Needs assistance Equipment used: Rolling walker (2 wheeled) Transfers: Sit to/from [employer] Transfers Sit to Stand: Supervision Stand pivot transfers: Supervision          Ambulation/Gait Ambulation/Gait assistance: Supervision;Min guard Gait Distance (Feet): 125 Feet Assistive device: Rolling walker (2 wheeled) Gait Pattern/deviations: Step-through pattern;Decreased step length - right;Decreased step length - left;Decreased stride length Gait velocity: decreased   General Gait Details: somewhat slow, labored gait with fairly erect posturing; paced self with decreased complaints of SOB/DOE; limited by fatigue.   Stairs             Wheelchair Mobility    Modified Rankin (Stroke Patients Only)       Balance Overall balance assessment: Mild deficits observed, not formally tested                                          Cognition Arousal/Alertness: Awake/alert Behavior During Therapy: WFL for tasks assessed/performed Overall Cognitive Status: Within Functional Limits for tasks assessed                                        Exercises General Exercises - Lower Extremity Long Arc Quad: Both;10 reps;Seated;AROM;Strengthening Hip Flexion/Marching: AROM;Strengthening;Both;10 reps;Seated Toe Raises: AROM;Seated;Both;10 reps Heel Raises: AROM;Seated;Both;10 reps    General Comments        Pertinent Vitals/Pain Pain Assessment: No/denies pain    Home Living  Prior Function            PT Goals (current goals can now be found in the care plan section) Acute Rehab PT Goals Patient Stated Goal: to go home PT Goal Formulation: With patient Time For Goal Achievement: 10/04/19 Potential to Achieve Goals: Good Progress towards PT goals: Progressing toward goals    Frequency    Min 3X/week      PT Plan Current plan remains appropriate    Co-evaluation               AM-PAC PT "6 Clicks" Mobility   Outcome Measure  Help needed turning from your back to your side while in a flat bed without using bedrails?: None Help needed moving from lying on your back to sitting on the side of a flat bed without using bedrails?: None Help needed moving to and from a bed to a chair (including a wheelchair)?: None Help needed standing up from a chair using your arms (e.g., wheelchair or bedside chair)?: A Little Help needed to walk in hospital room?: A Little Help needed climbing 3-5 steps with a railing? : A Lot 6 Click Score: 20    End of Session Equipment Utilized During Treatment: Gait belt Activity Tolerance: Patient tolerated treatment well;No increased pain;Patient limited by fatigue Patient left: in chair;with call bell/phone within reach Nurse Communication: Mobility status PT Visit Diagnosis: Unsteadiness on feet (R26.81);Muscle weakness (generalized) (M62.81);History of falling (Z91.81);Difficulty in walking, not elsewhere classified (R26.2)     Time: 8563-1497 PT Time Calculation (min) (ACUTE ONLY): 28 min  Charges:  $Gait Training: 8-22 mins $Therapeutic Exercise: 8-22 mins                     Floria Raveling. Hartnett-Rands, MS, PT Per [place] [phone] 09/23/2019, 9:48 AM

## 2019-09-26 DIAGNOSIS — M1030 Gout due to renal impairment, unspecified site: Secondary | ICD-10-CM | POA: Diagnosis not present

## 2019-09-26 DIAGNOSIS — I4819 Other persistent atrial fibrillation: Secondary | ICD-10-CM | POA: Diagnosis not present

## 2019-09-26 DIAGNOSIS — I083 Combined rheumatic disorders of mitral, aortic and tricuspid valves: Secondary | ICD-10-CM | POA: Diagnosis not present

## 2019-09-26 DIAGNOSIS — I130 Hypertensive heart and chronic kidney disease with heart failure and stage 1 through stage 4 chronic kidney disease, or unspecified chronic kidney disease: Secondary | ICD-10-CM | POA: Diagnosis not present

## 2019-09-26 DIAGNOSIS — I252 Old myocardial infarction: Secondary | ICD-10-CM | POA: Diagnosis not present

## 2019-09-26 DIAGNOSIS — N1832 Chronic kidney disease, stage 3b: Secondary | ICD-10-CM | POA: Diagnosis not present

## 2019-09-26 DIAGNOSIS — D631 Anemia in chronic kidney disease: Secondary | ICD-10-CM | POA: Diagnosis not present

## 2019-09-26 DIAGNOSIS — I2510 Atherosclerotic heart disease of native coronary artery without angina pectoris: Secondary | ICD-10-CM | POA: Diagnosis not present

## 2019-09-26 DIAGNOSIS — I0981 Rheumatic heart failure: Secondary | ICD-10-CM | POA: Diagnosis not present

## 2019-09-26 DIAGNOSIS — I5043 Acute on chronic combined systolic (congestive) and diastolic (congestive) heart failure: Secondary | ICD-10-CM | POA: Diagnosis not present

## 2019-09-29 DIAGNOSIS — I4891 Unspecified atrial fibrillation: Secondary | ICD-10-CM | POA: Diagnosis not present

## 2019-09-29 DIAGNOSIS — I2510 Atherosclerotic heart disease of native coronary artery without angina pectoris: Secondary | ICD-10-CM | POA: Diagnosis not present

## 2019-09-29 DIAGNOSIS — J449 Chronic obstructive pulmonary disease, unspecified: Secondary | ICD-10-CM | POA: Diagnosis not present

## 2019-09-29 DIAGNOSIS — R69 Illness, unspecified: Secondary | ICD-10-CM | POA: Diagnosis not present

## 2019-09-29 DIAGNOSIS — Z6831 Body mass index (BMI) 31.0-31.9, adult: Secondary | ICD-10-CM | POA: Diagnosis not present

## 2019-09-29 DIAGNOSIS — I5033 Acute on chronic diastolic (congestive) heart failure: Secondary | ICD-10-CM | POA: Diagnosis not present

## 2019-09-29 DIAGNOSIS — N1832 Chronic kidney disease, stage 3b: Secondary | ICD-10-CM | POA: Diagnosis not present

## 2019-09-30 DIAGNOSIS — I4819 Other persistent atrial fibrillation: Secondary | ICD-10-CM | POA: Diagnosis not present

## 2019-09-30 DIAGNOSIS — M1030 Gout due to renal impairment, unspecified site: Secondary | ICD-10-CM | POA: Diagnosis not present

## 2019-09-30 DIAGNOSIS — I130 Hypertensive heart and chronic kidney disease with heart failure and stage 1 through stage 4 chronic kidney disease, or unspecified chronic kidney disease: Secondary | ICD-10-CM | POA: Diagnosis not present

## 2019-09-30 DIAGNOSIS — N1832 Chronic kidney disease, stage 3b: Secondary | ICD-10-CM | POA: Diagnosis not present

## 2019-09-30 DIAGNOSIS — I0981 Rheumatic heart failure: Secondary | ICD-10-CM | POA: Diagnosis not present

## 2019-09-30 DIAGNOSIS — I083 Combined rheumatic disorders of mitral, aortic and tricuspid valves: Secondary | ICD-10-CM | POA: Diagnosis not present

## 2019-09-30 DIAGNOSIS — I2510 Atherosclerotic heart disease of native coronary artery without angina pectoris: Secondary | ICD-10-CM | POA: Diagnosis not present

## 2019-09-30 DIAGNOSIS — D631 Anemia in chronic kidney disease: Secondary | ICD-10-CM | POA: Diagnosis not present

## 2019-09-30 DIAGNOSIS — I5043 Acute on chronic combined systolic (congestive) and diastolic (congestive) heart failure: Secondary | ICD-10-CM | POA: Diagnosis not present

## 2019-09-30 DIAGNOSIS — I252 Old myocardial infarction: Secondary | ICD-10-CM | POA: Diagnosis not present

## 2019-10-02 DIAGNOSIS — D631 Anemia in chronic kidney disease: Secondary | ICD-10-CM | POA: Diagnosis not present

## 2019-10-02 DIAGNOSIS — N1832 Chronic kidney disease, stage 3b: Secondary | ICD-10-CM | POA: Diagnosis not present

## 2019-10-02 DIAGNOSIS — I5043 Acute on chronic combined systolic (congestive) and diastolic (congestive) heart failure: Secondary | ICD-10-CM | POA: Diagnosis not present

## 2019-10-02 DIAGNOSIS — M1030 Gout due to renal impairment, unspecified site: Secondary | ICD-10-CM | POA: Diagnosis not present

## 2019-10-02 DIAGNOSIS — I2510 Atherosclerotic heart disease of native coronary artery without angina pectoris: Secondary | ICD-10-CM | POA: Diagnosis not present

## 2019-10-02 DIAGNOSIS — I0981 Rheumatic heart failure: Secondary | ICD-10-CM | POA: Diagnosis not present

## 2019-10-02 DIAGNOSIS — I083 Combined rheumatic disorders of mitral, aortic and tricuspid valves: Secondary | ICD-10-CM | POA: Diagnosis not present

## 2019-10-02 DIAGNOSIS — I252 Old myocardial infarction: Secondary | ICD-10-CM | POA: Diagnosis not present

## 2019-10-02 DIAGNOSIS — I4819 Other persistent atrial fibrillation: Secondary | ICD-10-CM | POA: Diagnosis not present

## 2019-10-02 DIAGNOSIS — I130 Hypertensive heart and chronic kidney disease with heart failure and stage 1 through stage 4 chronic kidney disease, or unspecified chronic kidney disease: Secondary | ICD-10-CM | POA: Diagnosis not present

## 2019-10-05 NOTE — Progress Notes (Signed)
Cardiology Office Note  Date: 10/06/2019   ID: [name], DOB 1932/06/16, MRN [id number]  PCP:  Rosalee Kaufman, PA-C  Cardiologist:  Kate Sable, MD Electrophysiologist:  Cristopher Peru, MD   Chief Complaint: Follow-up chronic diastolic heart failure, PAF, NSVT, hypertension.  History of Present Illness: [name] is a 86 y.o. female with a history of chronic diastolic heart failure, PAF, NSVT, hypertension, CAD, HLD, HTN  Last saw Dr. Lovena Le 06/17/2019.  She had gained some weight and had not been taking her Lasix.  He asked her to go back on Lasix 40 mg and her weight was below 165.[name] Kitchen  He stated her cardiac monitor was reassuring and had her continue her Toprol.  She had 1 single episode of orthostasis.  He stated she would not hold her Eliquis but if she continued to pass out would suggest this.  She saw Dr. Bronson Ing previously on 06/26/2019 via telemedicine visit.  She continued to have chronic exertional dyspnea felt secondary to deconditioning and having NYHA class II symptoms.  Expressing multiple somatic complaints including back pain, knee pain, feet pain, insomnia.  Stated she continues to be depressed due to the death of her son from a motorcycle accident 03/30/2019.  States she has been having panic attacks since that event.  She complained of chronic left-sided chest pains and stated changing positions in the bed to help.  She stated her palpitations seldom occurred.  Here today for follow-up.  She denies any particular cardiac complaints.  No complaints of angina or exertional symptoms, palpitations or arrhythmias, orthostatic symptoms, stroke or TIA-like symptoms, PND orthopnea, bleeding issues, claudication-like issues, DVT or lower extremity edema.  Primary complaints stem from her low back pain with numbness down both lower extremities and sciatica-like pain down the right leg.  States she has had previous back surgeries at [facility].  And would like to be referred for consultation for back pain.  She has had some recent falls according to her recent note from her PCP.  Had a recent fall with a fractured rib per her statement.  Past Medical History:  Diagnosis Date   Anxiety    Arthritis    Atrial fibrillation (HCC)    Bursitis    Left shoulder   Cataract    CHF (congestive heart failure) (HCC)    COPD (chronic obstructive pulmonary disease) (HCC)    Coronary atherosclerosis of native coronary artery    a. s/p DES to LCx in 04/2013 b. cath in 11/2015 showing patent stent with 20% prox-LAD and 80% ostial RCA stenosis for which medical management was recommended due to small artery size   Depression    Diastolic heart failure (HCC)    EF 55-60%   Dysphagia, unspecified(787.20)    Dyspnea    Dysrhythmia    Essential hypertension    GERD (gastroesophageal reflux disease)    Hx Schatzki's ring, multiple EGD/ED last 01/06/2004   Gout    Headache    History of anemia    Hyperlipidemia    Internal hemorrhoids without mention of complication    MI (myocardial infarction) ([place]) 2006   Microscopic colitis 2003   Panic disorder without agoraphobia    Paresthesia    Pneumonia 12/2011   PVD (peripheral vascular disease) (HCC)    S/P colonoscopy 09/27/2001   internal hemorrhoids, desc colon inflam polyp, SB BX-chronic duodenitis, colitis   Thyroid disease     Past Surgical History:  Procedure Laterality  Date   ABDOMINAL HYSTERECTOMY     ABDOMINAL HYSTERECTOMY     ANTERIOR AND POSTERIOR REPAIR     with resection of vagina   ANTERIOR LAT LUMBAR FUSION N/A 08/01/2016   Procedure: Lumbar Two-Lumbar Five Transpsoas lateral interbody fusion with Lumbar Two-Three lateral plate fixation;  Surgeon: Kevan Ny Ditty, MD;  Location: [place];  Service: Neurosurgery;  Laterality: N/A;  L2-5 Transpsoas lateral interbody fusion with L2-3 lateral plate fixation   APPENDECTOMY     BACK  SURGERY     BIOPSY  07/05/2015   Procedure: BIOPSY;  Surgeon: Daneil Dolin, MD;  [facility];  Service: Endoscopy;;  gastric polyp biopsy, ascending colon biopsy   BLADDER SUSPENSION  11/09/2011   Procedure: TRANSVAGINAL TAPE (TVT) PROCEDURE;  Surgeon: Marissa Nestle, MD;  Location: [facility];  Service: Urology;  Laterality: N/A;   bladder tack  06/2010   BREAST LUMPECTOMY  1998   left, benign   CARDIAC CATHETERIZATION     CARDIAC CATHETERIZATION     CARDIAC CATHETERIZATION N/A 12/16/2015   Procedure: Left Heart Cath and Coronary Angiography;  Surgeon: Troy Sine, MD;  Location: [place] [facility];  Service: Cardiovascular;  Laterality: N/A;   CARDIOVERSION N/A 10/04/2017   Procedure: CARDIOVERSION;  Surgeon: Herminio Commons, MD;  Location: [facility];  Service: Cardiovascular;  Laterality: N/A;   CARDIOVERSION N/A 01/30/2018   Procedure: CARDIOVERSION;  Surgeon: Herminio Commons, MD;  [facility];  Service: Cardiovascular;  Laterality: N/A;   [place]   left   cataract surgery     CHOLECYSTECTOMY  1998   Cholecystectomy     COLONOSCOPY  03/16/2011   multiple hyperplastic colon polyps, sigmoid diverticulosis, melanosis coli   COLONOSCOPY WITH PROPOFOL N/A 07/05/2015   RMR:one 5 mm polyp in descending colon   CORONARY ANGIOGRAPHY N/A 05/16/2018   Procedure: CORONARY ANGIOGRAPHY (CATH LAB);  Surgeon: Belva Crome, MD;  Location: [place] [facility];  Service: Cardiovascular;  Laterality: N/A;   CORONARY ANGIOPLASTY WITH STENT PLACEMENT     ESOPHAGEAL DILATION N/A 07/05/2015   Procedure: ESOPHAGEAL DILATION;  Surgeon: Daneil Dolin, MD;  [facility];  Service: Endoscopy;  Laterality: N/A;   ESOPHAGOGASTRODUODENOSCOPY (EGD) WITH PROPOFOL N/A 07/05/2015   JQZ:[id number]   JOINT REPLACEMENT Right 2007   right knee   left hand surgery     LEFT HEART CATHETERIZATION WITH CORONARY ANGIOGRAM N/A 05/14/2013    Procedure: LEFT HEART CATHETERIZATION WITH CORONARY ANGIOGRAM;  Surgeon: Blane Ohara, MD;  Location: [facility];  Service: Cardiovascular;  Laterality: N/A;   left rotator cuff surgery     LUMBAR LAMINECTOMY/DECOMPRESSION MICRODISCECTOMY N/A 10/11/2012   Procedure: LUMBAR LAMINECTOMY/DECOMPRESSION MICRODISCECTOMY 2 LEVELS;  Surgeon: Floyce Stakes, MD;  Location: [place] [facility];  Service: Neurosurgery;  Laterality: N/A;  L3-4 L4-5 Laminectomy   LUMBAR WOUND DEBRIDEMENT N/A 09/27/2015   Procedure: Exploration of Lumbar Wound w/ Repair CSF Leak/Lumbar Drain Placement;  Surgeon: Leeroy Cha, MD;  Location: [place] [facility];  Service: Neurosurgery;  Laterality: N/A;   MALONEY DILATION  03/16/2011   Gastritis. No H.pylori on bx. 58F maloney dilation with disruption of  occult cevical esophageal web   NASAL SINUS SURGERY     right knee replacement  2007   right leg benign tumor     SHOULDER SURGERY Left    TEE WITHOUT CARDIOVERSION N/A 10/04/2017   Procedure: TRANSESOPHAGEAL ECHOCARDIOGRAM (TEE) WITH PROPOFOL;  Surgeon:  Herminio Commons, MD;  [facility];  Service: Cardiovascular;  Laterality: N/A;   TONSILLECTOMY     unspecified area, hysterectomy  1972   partial    Current Outpatient Medications  Medication Sig Dispense Refill   acetaminophen (TYLENOL) 500 MG tablet Take 500 mg by mouth every 6 (six) hours as needed for headache.      ALLERGY RELIEF 10 MG tablet Take 10 mg by mouth daily.     ALPRAZolam (XANAX) 0.25 MG tablet Take 0.25 mg by mouth 2 (two) times daily as needed for anxiety.   0   apixaban (ELIQUIS) 5 MG TABS tablet Take 1 tablet (5 mg total) by mouth 2 (two) times daily. 180 tablet 3   diltiazem (CARDIZEM CD) 180 MG 24 hr capsule Take 1 capsule (180 mg total) by mouth daily. 30 capsule 1   DULoxetine (CYMBALTA) 60 MG capsule Take 60 mg by mouth at bedtime.      fluticasone (CUTIVATE) 0.05 % cream Apply 1 application topically daily.      fluticasone (FLONASE) 50 MCG/ACT nasal spray Place 2 sprays into both nostrils 2 (two) times daily as needed for allergies.      furosemide (LASIX) 40 MG tablet Take 1 tablet (40 mg total) by mouth daily. 30 tablet 1   ipratropium-albuterol (DUONEB) 0.5-2.5 (3) MG/3ML SOLN Take 3 mLs by nebulization every 6 (six) hours. 360 mL 0   isosorbide mononitrate (IMDUR) 60 MG 24 hr tablet Take 1 tablet (60 mg total) by mouth daily. 90 tablet 3   levothyroxine (SYNTHROID, LEVOTHROID) 25 MCG tablet Take 1 tablet by mouth daily.     magnesium oxide (MAG-OX) 400 (241.3 Mg) MG tablet TAKE ONE TABLET BY MOUTH DAILY. 90 tablet 3   methocarbamol (ROBAXIN) 500 MG tablet Take 500 mg by mouth every 6 (six) hours as needed for muscle spasms.      metoprolol succinate (TOPROL-XL) 50 MG 24 hr tablet Take 1 1/2 tabs by mouth daily 135 tablet 3   Multiple Vitamin (MULTIVITAMIN WITH MINERALS) TABS tablet Take 1 tablet by mouth daily. Centrum      nitroGLYCERIN (NITROSTAT) 0.4 MG SL tablet Place 1 tablet (0.4 mg total) under the tongue every 5 (five) minutes as needed for chest pain. Reported on 08/04/2015 25 tablet 6   pantoprazole (PROTONIX) 40 MG tablet Take 40 mg by mouth daily.      potassium chloride (KLOR-CON) 10 MEQ tablet Take 1 tablet (10 mEq total) by mouth daily. 180 tablet 1   sodium chloride (OCEAN) 0.65 % SOLN nasal spray Place 1 spray into both nostrils as needed (allergies).      No current facility-administered medications for this visit.   Allergies:  Cephalosporins, Levaquin [levofloxacin in d5w], Macrodantin [nitrofurantoin macrocrystal], Phenothiazines, Polysorbate, Prednisone, Buspirone, Cardura [doxazosin mesylate], Codeine, Acyclovir and related, Colcrys [colchicine], Prochlorperazine, Ranexa [ranolazine], Atorvastatin, Ofloxacin, Other, Penicillins, and Pimozide   Social History: The patient  reports that she quit smoking about 17 years ago. Her smoking use included cigarettes. She  started smoking about 71 years ago. She has a 64.00 pack-year smoking history. She has never used smokeless tobacco. She reports that she does not drink alcohol and does not use drugs.   Family History: The patient's family history includes Cancer in an other family member; Coronary artery disease in an other family member; Diabetes in her brother and son; Heart disease in her son; Hypertension in an other family member; Parkinson's disease in her father; Stroke in her mother and  another family member; Stroke (age of onset: 97) in her daughter.   ROS:  Please see the history of present illness. Otherwise, complete review of systems is positive for none.  All other systems are reviewed and negative.   Physical Exam: VS:  BP 138/64    Pulse 77    Ht 5\' 1"  (1.549 m)    Wt 167 lb 3.2 oz (75.8 kg)    SpO2 94%    BMI 31.59 kg/m , BMI Body mass index is 31.59 kg/m.  Wt Readings from Last 3 Encounters:  10/06/19 167 lb 3.2 oz (75.8 kg)  09/23/19 165 lb 9.1 oz (75.1 kg)  06/17/19 169 lb (76.7 kg)    General: Patient appears comfortable at rest. Neck: Supple, no elevated JVP or carotid bruits, no thyromegaly. Lungs: Clear to auscultation, nonlabored breathing at rest. Cardiac: Regular rate and rhythm, no S3 or significant systolic murmur, no pericardial rub. Extremities: No pitting edema, distal pulses 2+. Skin: Warm and dry. Musculoskeletal: No kyphosis. Neuropsychiatric: Alert and oriented x3, affect grossly appropriate.  ECG:  Recent EKG on September 19, 2019 showed atrial fibrillation with RVR rate of 109.  Recent Labwork: 09/19/2019: ALT 13; AST 20; B Natriuretic Peptide 788.0; TSH 3.127 09/20/2019: Hemoglobin 10.9; Platelets 347 09/22/2019: Magnesium 1.8 09/23/2019: BUN 24; Creatinine, Ser 1.41; Potassium 4.2; Sodium 136     Component Value Date/Time   CHOL 177 12/16/2015 0435   TRIG 197 (H) 12/16/2015 0435   HDL 35 (L) 12/16/2015 0435   CHOLHDL 5.1 12/16/2015 0435   VLDL 39 12/16/2015 0435    LDLCALC 103 (H) 12/16/2015 0435    Other Studies Reviewed Today:  Cardiac Catheterization: 05/16/2018  Patent short left main  20 to 30% proximal LAD irregularity, with diffuse 20% narrowing within the mid vessel.  Patent proximal circumflex stent with mid to distal circumflex irregularities up to 30 to 40%.  Nondominant right coronary with ostial 90% narrowing.  No hemodynamics recorded. Left ventriculography was not performed in an effort to decrease contrast exposure in the setting of CKD.  RECOMMENDATIONS:   Compared to prior angiography in 2017, no significant changes noted.  Chest pain is atypical and may be more musculoskeletal or pleuritic.  Resume Eliquis in 12 hours/a.m. of 05/17/2018   Echocardiogram 10/02/18:  1. The left ventricle has normal systolic function with an ejection  fraction of 60-65%. The cavity size was normal. There is mildly increased  left ventricular wall thickness.  2. The right ventricle has normal systolic function. The cavity was  normal. There is no increase in right ventricular wall thickness.  3. Left atrial size was mildly dilated.  4. The aortic valve is tricuspid. Mild thickening of the aortic valve.  Mild calcification of the aortic valve. Aortic valve regurgitation is mild  by color flow Doppler. No stenosis of the aortic valve. Mild aortic  annular calcification noted.  5. The mitral valve is abnormal. Mild thickening of the mitral valve  leaflet. Mild calcification of the mitral valve leaflet. There is mild  mitral annular calcification present. No evidence of mitral valve  stenosis.  6. The aortic root is normal in size and structure.  7. Pulmonary hypertension is indeterminant, inadequate TR jet.    Event monitor 05/13/19:  1. NSR  2. NSVT, lasting upto 6 beats 3. No prolonged pauses or bradycardia 4. No atrial fib   Assessment and Plan:  1. CAD in native artery   2. Chronic diastolic heart failure  ([place])   3.  Longstanding persistent atrial fibrillation ([place])   4. Hyperlipidemia LDL goal <70   5. NSVT (nonsustained ventricular tachycardia) (HCC)   6. Bilateral low back pain with right-sided sciatica, unspecified chronicity    1. CAD in native artery DES to LCX 2015.  Cath 05/16/2018 20 to 30% stenosis LAD irregularity with 20% narrowing within the mid vessel.  Patent proximal circumflex stent with mid to distal circumflex irregularities up to 30 to 40%.  Nondominant RCA with ostial 90% narrowing.  No significant changes noted since prior cath 2017.  Denies any recent anginal or exertional symptoms. Continue Imdur 60 mg daily.  Toprol-XL 50 mg daily.  2. Chronic diastolic heart failure ([place]) Echo 10/02/2018 EF  60-65% Left ventricular diastolic Doppler parameters  consistent with impaired relaxation. Elevated mean left atrial pressure.  Continue Lasix 40 mg daily.  3. Longstanding persistent atrial fibrillation (HCC) Pulse is irregularly irregular today with a rate 77.  Last EKG showed atrial fibrillation with RVR on June 4 rate of 109.  Continue Eliquis 5 mg p.o. twice daily, Cardizem CD 180 mg daily.  Toprol-XL 50 mg daily.  4. Hyperlipidemia LDL goal <70 Last known lipid panel total cholesterol 177, triglycerides 197, HDL 35, LDL 103 12/16/2015.  Not on statin medication.  Request recent lab work from PCP to assess lipid level.  5. NSVT (nonsustained ventricular tachycardia) (HCC)  Event monitor 05/13/2019 NSVT 6 beats denies any recent racing heart or fluttering sensations in her chest.  Has chronic atrial fibrillation  6.  Bilateral low back pain with right-sided sciatica. Patient's main complaint today is chronic back pain worsening recently with numbness in both lower legs and sciatica-like pain down the right leg.  States she can barely function on a daily basis due to pain and inability to mobilize as she would like.  She has had previous back surgery at [facility].  Please refer her back to them.  Medication Adjustments/Labs and Tests Ordered: Current medicines are reviewed at length with the patient today.  Concerns regarding medicines are outlined above.   Disposition: Follow-up with Dr. Bronson Ing or APP 6 months  Signed, Levell July, NP 10/06/2019 11:57 AM    [facility] [address], [place], [place] [address] Phone: [phone]; Fax: [phone]

## 2019-10-06 ENCOUNTER — Ambulatory Visit (INDEPENDENT_AMBULATORY_CARE_PROVIDER_SITE_OTHER): Payer: Medicare HMO | Admitting: Family Medicine

## 2019-10-06 ENCOUNTER — Encounter: Payer: Self-pay | Admitting: Family Medicine

## 2019-10-06 ENCOUNTER — Other Ambulatory Visit: Payer: Self-pay

## 2019-10-06 VITALS — BP 138/64 | HR 77 | Ht 61.0 in | Wt 167.2 lb

## 2019-10-06 DIAGNOSIS — I5032 Chronic diastolic (congestive) heart failure: Secondary | ICD-10-CM

## 2019-10-06 DIAGNOSIS — I472 Ventricular tachycardia: Secondary | ICD-10-CM

## 2019-10-06 DIAGNOSIS — I2510 Atherosclerotic heart disease of native coronary artery without angina pectoris: Secondary | ICD-10-CM | POA: Diagnosis not present

## 2019-10-06 DIAGNOSIS — M5441 Lumbago with sciatica, right side: Secondary | ICD-10-CM | POA: Diagnosis not present

## 2019-10-06 DIAGNOSIS — I4811 Longstanding persistent atrial fibrillation: Secondary | ICD-10-CM

## 2019-10-06 DIAGNOSIS — E785 Hyperlipidemia, unspecified: Secondary | ICD-10-CM | POA: Diagnosis not present

## 2019-10-06 DIAGNOSIS — I4729 Other ventricular tachycardia: Secondary | ICD-10-CM

## 2019-10-06 NOTE — Patient Instructions (Signed)
Medication Instructions:  Continue all current medications.  Labwork: none  Testing/Procedures: none  Follow-Up: 6 months   Any Other Special Instructions Will Be Listed Below (If Applicable). You have been referred to:  [facility]  If you need a refill on your cardiac medications before your next appointment, please call your pharmacy.

## 2019-10-07 DIAGNOSIS — Z6831 Body mass index (BMI) 31.0-31.9, adult: Secondary | ICD-10-CM | POA: Diagnosis not present

## 2019-10-07 DIAGNOSIS — M545 Low back pain: Secondary | ICD-10-CM | POA: Diagnosis not present

## 2019-10-07 DIAGNOSIS — M1030 Gout due to renal impairment, unspecified site: Secondary | ICD-10-CM | POA: Diagnosis not present

## 2019-10-07 DIAGNOSIS — I0981 Rheumatic heart failure: Secondary | ICD-10-CM | POA: Diagnosis not present

## 2019-10-07 DIAGNOSIS — N1832 Chronic kidney disease, stage 3b: Secondary | ICD-10-CM | POA: Diagnosis not present

## 2019-10-07 DIAGNOSIS — R1030 Lower abdominal pain, unspecified: Secondary | ICD-10-CM | POA: Diagnosis not present

## 2019-10-07 DIAGNOSIS — K625 Hemorrhage of anus and rectum: Secondary | ICD-10-CM | POA: Diagnosis not present

## 2019-10-07 DIAGNOSIS — I083 Combined rheumatic disorders of mitral, aortic and tricuspid valves: Secondary | ICD-10-CM | POA: Diagnosis not present

## 2019-10-07 DIAGNOSIS — I5043 Acute on chronic combined systolic (congestive) and diastolic (congestive) heart failure: Secondary | ICD-10-CM | POA: Diagnosis not present

## 2019-10-07 DIAGNOSIS — I4819 Other persistent atrial fibrillation: Secondary | ICD-10-CM | POA: Diagnosis not present

## 2019-10-07 DIAGNOSIS — I2510 Atherosclerotic heart disease of native coronary artery without angina pectoris: Secondary | ICD-10-CM | POA: Diagnosis not present

## 2019-10-07 DIAGNOSIS — D631 Anemia in chronic kidney disease: Secondary | ICD-10-CM | POA: Diagnosis not present

## 2019-10-07 DIAGNOSIS — R7989 Other specified abnormal findings of blood chemistry: Secondary | ICD-10-CM | POA: Diagnosis not present

## 2019-10-07 DIAGNOSIS — R69 Illness, unspecified: Secondary | ICD-10-CM | POA: Diagnosis not present

## 2019-10-07 DIAGNOSIS — I130 Hypertensive heart and chronic kidney disease with heart failure and stage 1 through stage 4 chronic kidney disease, or unspecified chronic kidney disease: Secondary | ICD-10-CM | POA: Diagnosis not present

## 2019-10-07 DIAGNOSIS — I252 Old myocardial infarction: Secondary | ICD-10-CM | POA: Diagnosis not present

## 2019-10-10 DIAGNOSIS — M1030 Gout due to renal impairment, unspecified site: Secondary | ICD-10-CM | POA: Diagnosis not present

## 2019-10-10 DIAGNOSIS — I5043 Acute on chronic combined systolic (congestive) and diastolic (congestive) heart failure: Secondary | ICD-10-CM | POA: Diagnosis not present

## 2019-10-10 DIAGNOSIS — I4819 Other persistent atrial fibrillation: Secondary | ICD-10-CM | POA: Diagnosis not present

## 2019-10-10 DIAGNOSIS — N1832 Chronic kidney disease, stage 3b: Secondary | ICD-10-CM | POA: Diagnosis not present

## 2019-10-10 DIAGNOSIS — I130 Hypertensive heart and chronic kidney disease with heart failure and stage 1 through stage 4 chronic kidney disease, or unspecified chronic kidney disease: Secondary | ICD-10-CM | POA: Diagnosis not present

## 2019-10-10 DIAGNOSIS — I2510 Atherosclerotic heart disease of native coronary artery without angina pectoris: Secondary | ICD-10-CM | POA: Diagnosis not present

## 2019-10-10 DIAGNOSIS — I083 Combined rheumatic disorders of mitral, aortic and tricuspid valves: Secondary | ICD-10-CM | POA: Diagnosis not present

## 2019-10-10 DIAGNOSIS — I252 Old myocardial infarction: Secondary | ICD-10-CM | POA: Diagnosis not present

## 2019-10-10 DIAGNOSIS — I0981 Rheumatic heart failure: Secondary | ICD-10-CM | POA: Diagnosis not present

## 2019-10-10 DIAGNOSIS — D631 Anemia in chronic kidney disease: Secondary | ICD-10-CM | POA: Diagnosis not present

## 2019-10-14 DIAGNOSIS — I130 Hypertensive heart and chronic kidney disease with heart failure and stage 1 through stage 4 chronic kidney disease, or unspecified chronic kidney disease: Secondary | ICD-10-CM | POA: Diagnosis not present

## 2019-10-14 DIAGNOSIS — M1030 Gout due to renal impairment, unspecified site: Secondary | ICD-10-CM | POA: Diagnosis not present

## 2019-10-14 DIAGNOSIS — I083 Combined rheumatic disorders of mitral, aortic and tricuspid valves: Secondary | ICD-10-CM | POA: Diagnosis not present

## 2019-10-14 DIAGNOSIS — I0981 Rheumatic heart failure: Secondary | ICD-10-CM | POA: Diagnosis not present

## 2019-10-14 DIAGNOSIS — I2510 Atherosclerotic heart disease of native coronary artery without angina pectoris: Secondary | ICD-10-CM | POA: Diagnosis not present

## 2019-10-14 DIAGNOSIS — I5043 Acute on chronic combined systolic (congestive) and diastolic (congestive) heart failure: Secondary | ICD-10-CM | POA: Diagnosis not present

## 2019-10-14 DIAGNOSIS — N1832 Chronic kidney disease, stage 3b: Secondary | ICD-10-CM | POA: Diagnosis not present

## 2019-10-14 DIAGNOSIS — D631 Anemia in chronic kidney disease: Secondary | ICD-10-CM | POA: Diagnosis not present

## 2019-10-14 DIAGNOSIS — I252 Old myocardial infarction: Secondary | ICD-10-CM | POA: Diagnosis not present

## 2019-10-14 DIAGNOSIS — I4819 Other persistent atrial fibrillation: Secondary | ICD-10-CM | POA: Diagnosis not present

## 2019-10-15 ENCOUNTER — Encounter: Payer: Self-pay | Admitting: Internal Medicine

## 2019-10-15 DIAGNOSIS — N1832 Chronic kidney disease, stage 3b: Secondary | ICD-10-CM | POA: Diagnosis not present

## 2019-10-15 DIAGNOSIS — I5033 Acute on chronic diastolic (congestive) heart failure: Secondary | ICD-10-CM | POA: Diagnosis not present

## 2019-10-15 DIAGNOSIS — J440 Chronic obstructive pulmonary disease with acute lower respiratory infection: Secondary | ICD-10-CM | POA: Diagnosis not present

## 2019-10-15 DIAGNOSIS — Z87891 Personal history of nicotine dependence: Secondary | ICD-10-CM | POA: Diagnosis not present

## 2019-10-15 DIAGNOSIS — I130 Hypertensive heart and chronic kidney disease with heart failure and stage 1 through stage 4 chronic kidney disease, or unspecified chronic kidney disease: Secondary | ICD-10-CM | POA: Diagnosis not present

## 2019-10-16 DIAGNOSIS — I4819 Other persistent atrial fibrillation: Secondary | ICD-10-CM | POA: Diagnosis not present

## 2019-10-16 DIAGNOSIS — I252 Old myocardial infarction: Secondary | ICD-10-CM | POA: Diagnosis not present

## 2019-10-16 DIAGNOSIS — I083 Combined rheumatic disorders of mitral, aortic and tricuspid valves: Secondary | ICD-10-CM | POA: Diagnosis not present

## 2019-10-16 DIAGNOSIS — N1832 Chronic kidney disease, stage 3b: Secondary | ICD-10-CM | POA: Diagnosis not present

## 2019-10-16 DIAGNOSIS — D631 Anemia in chronic kidney disease: Secondary | ICD-10-CM | POA: Diagnosis not present

## 2019-10-16 DIAGNOSIS — I2510 Atherosclerotic heart disease of native coronary artery without angina pectoris: Secondary | ICD-10-CM | POA: Diagnosis not present

## 2019-10-16 DIAGNOSIS — I5043 Acute on chronic combined systolic (congestive) and diastolic (congestive) heart failure: Secondary | ICD-10-CM | POA: Diagnosis not present

## 2019-10-16 DIAGNOSIS — I130 Hypertensive heart and chronic kidney disease with heart failure and stage 1 through stage 4 chronic kidney disease, or unspecified chronic kidney disease: Secondary | ICD-10-CM | POA: Diagnosis not present

## 2019-10-16 DIAGNOSIS — I0981 Rheumatic heart failure: Secondary | ICD-10-CM | POA: Diagnosis not present

## 2019-10-16 DIAGNOSIS — M1030 Gout due to renal impairment, unspecified site: Secondary | ICD-10-CM | POA: Diagnosis not present

## 2019-10-21 ENCOUNTER — Other Ambulatory Visit: Payer: Self-pay | Admitting: Cardiovascular Disease

## 2019-10-22 DIAGNOSIS — I4819 Other persistent atrial fibrillation: Secondary | ICD-10-CM | POA: Diagnosis not present

## 2019-10-22 DIAGNOSIS — M1030 Gout due to renal impairment, unspecified site: Secondary | ICD-10-CM | POA: Diagnosis not present

## 2019-10-22 DIAGNOSIS — I083 Combined rheumatic disorders of mitral, aortic and tricuspid valves: Secondary | ICD-10-CM | POA: Diagnosis not present

## 2019-10-22 DIAGNOSIS — N1832 Chronic kidney disease, stage 3b: Secondary | ICD-10-CM | POA: Diagnosis not present

## 2019-10-22 DIAGNOSIS — I130 Hypertensive heart and chronic kidney disease with heart failure and stage 1 through stage 4 chronic kidney disease, or unspecified chronic kidney disease: Secondary | ICD-10-CM | POA: Diagnosis not present

## 2019-10-22 DIAGNOSIS — D631 Anemia in chronic kidney disease: Secondary | ICD-10-CM | POA: Diagnosis not present

## 2019-10-22 DIAGNOSIS — I252 Old myocardial infarction: Secondary | ICD-10-CM | POA: Diagnosis not present

## 2019-10-22 DIAGNOSIS — I2510 Atherosclerotic heart disease of native coronary artery without angina pectoris: Secondary | ICD-10-CM | POA: Diagnosis not present

## 2019-10-22 DIAGNOSIS — I0981 Rheumatic heart failure: Secondary | ICD-10-CM | POA: Diagnosis not present

## 2019-10-22 DIAGNOSIS — I5043 Acute on chronic combined systolic (congestive) and diastolic (congestive) heart failure: Secondary | ICD-10-CM | POA: Diagnosis not present

## 2019-10-23 DIAGNOSIS — I5033 Acute on chronic diastolic (congestive) heart failure: Secondary | ICD-10-CM | POA: Diagnosis not present

## 2019-10-23 DIAGNOSIS — K7689 Other specified diseases of liver: Secondary | ICD-10-CM | POA: Diagnosis not present

## 2019-10-23 DIAGNOSIS — N1832 Chronic kidney disease, stage 3b: Secondary | ICD-10-CM | POA: Diagnosis not present

## 2019-10-23 DIAGNOSIS — R69 Illness, unspecified: Secondary | ICD-10-CM | POA: Diagnosis not present

## 2019-10-23 DIAGNOSIS — I2510 Atherosclerotic heart disease of native coronary artery without angina pectoris: Secondary | ICD-10-CM | POA: Diagnosis not present

## 2019-10-23 DIAGNOSIS — Z9049 Acquired absence of other specified parts of digestive tract: Secondary | ICD-10-CM | POA: Diagnosis not present

## 2019-10-23 DIAGNOSIS — J449 Chronic obstructive pulmonary disease, unspecified: Secondary | ICD-10-CM | POA: Diagnosis not present

## 2019-10-23 DIAGNOSIS — R945 Abnormal results of liver function studies: Secondary | ICD-10-CM | POA: Diagnosis not present

## 2019-10-23 DIAGNOSIS — I4891 Unspecified atrial fibrillation: Secondary | ICD-10-CM | POA: Diagnosis not present

## 2019-10-23 DIAGNOSIS — I700 Atherosclerosis of aorta: Secondary | ICD-10-CM | POA: Diagnosis not present

## 2019-10-29 DIAGNOSIS — I083 Combined rheumatic disorders of mitral, aortic and tricuspid valves: Secondary | ICD-10-CM | POA: Diagnosis not present

## 2019-10-29 DIAGNOSIS — I5043 Acute on chronic combined systolic (congestive) and diastolic (congestive) heart failure: Secondary | ICD-10-CM | POA: Diagnosis not present

## 2019-10-29 DIAGNOSIS — D631 Anemia in chronic kidney disease: Secondary | ICD-10-CM | POA: Diagnosis not present

## 2019-10-29 DIAGNOSIS — I0981 Rheumatic heart failure: Secondary | ICD-10-CM | POA: Diagnosis not present

## 2019-10-29 DIAGNOSIS — I4819 Other persistent atrial fibrillation: Secondary | ICD-10-CM | POA: Diagnosis not present

## 2019-10-29 DIAGNOSIS — I252 Old myocardial infarction: Secondary | ICD-10-CM | POA: Diagnosis not present

## 2019-10-29 DIAGNOSIS — N1832 Chronic kidney disease, stage 3b: Secondary | ICD-10-CM | POA: Diagnosis not present

## 2019-10-29 DIAGNOSIS — I2510 Atherosclerotic heart disease of native coronary artery without angina pectoris: Secondary | ICD-10-CM | POA: Diagnosis not present

## 2019-10-29 DIAGNOSIS — I130 Hypertensive heart and chronic kidney disease with heart failure and stage 1 through stage 4 chronic kidney disease, or unspecified chronic kidney disease: Secondary | ICD-10-CM | POA: Diagnosis not present

## 2019-10-29 DIAGNOSIS — M1030 Gout due to renal impairment, unspecified site: Secondary | ICD-10-CM | POA: Diagnosis not present

## 2019-10-30 DIAGNOSIS — J440 Chronic obstructive pulmonary disease with acute lower respiratory infection: Secondary | ICD-10-CM | POA: Diagnosis not present

## 2019-10-30 DIAGNOSIS — J449 Chronic obstructive pulmonary disease, unspecified: Secondary | ICD-10-CM | POA: Diagnosis not present

## 2019-10-30 DIAGNOSIS — R69 Illness, unspecified: Secondary | ICD-10-CM | POA: Diagnosis not present

## 2019-10-31 DIAGNOSIS — G8929 Other chronic pain: Secondary | ICD-10-CM | POA: Diagnosis not present

## 2019-10-31 DIAGNOSIS — M5416 Radiculopathy, lumbar region: Secondary | ICD-10-CM | POA: Diagnosis not present

## 2019-11-05 DIAGNOSIS — M1030 Gout due to renal impairment, unspecified site: Secondary | ICD-10-CM | POA: Diagnosis not present

## 2019-11-05 DIAGNOSIS — I2510 Atherosclerotic heart disease of native coronary artery without angina pectoris: Secondary | ICD-10-CM | POA: Diagnosis not present

## 2019-11-05 DIAGNOSIS — I083 Combined rheumatic disorders of mitral, aortic and tricuspid valves: Secondary | ICD-10-CM | POA: Diagnosis not present

## 2019-11-05 DIAGNOSIS — I130 Hypertensive heart and chronic kidney disease with heart failure and stage 1 through stage 4 chronic kidney disease, or unspecified chronic kidney disease: Secondary | ICD-10-CM | POA: Diagnosis not present

## 2019-11-05 DIAGNOSIS — I4819 Other persistent atrial fibrillation: Secondary | ICD-10-CM | POA: Diagnosis not present

## 2019-11-05 DIAGNOSIS — D631 Anemia in chronic kidney disease: Secondary | ICD-10-CM | POA: Diagnosis not present

## 2019-11-05 DIAGNOSIS — I0981 Rheumatic heart failure: Secondary | ICD-10-CM | POA: Diagnosis not present

## 2019-11-05 DIAGNOSIS — N1832 Chronic kidney disease, stage 3b: Secondary | ICD-10-CM | POA: Diagnosis not present

## 2019-11-05 DIAGNOSIS — I252 Old myocardial infarction: Secondary | ICD-10-CM | POA: Diagnosis not present

## 2019-11-05 DIAGNOSIS — I5043 Acute on chronic combined systolic (congestive) and diastolic (congestive) heart failure: Secondary | ICD-10-CM | POA: Diagnosis not present

## 2019-11-08 ENCOUNTER — Inpatient Hospital Stay (HOSPITAL_COMMUNITY)
Admission: EM | Admit: 2019-11-08 | Discharge: 2019-11-11 | DRG: 303 | Disposition: A | Discharge status: Home / Self Care | Payer: Medicare HMO | Attending: Cardiovascular Disease | Admitting: Cardiovascular Disease

## 2019-11-08 ENCOUNTER — Emergency Department (HOSPITAL_COMMUNITY): Payer: Medicare HMO

## 2019-11-08 ENCOUNTER — Encounter (HOSPITAL_COMMUNITY): Payer: Self-pay | Admitting: *Deleted

## 2019-11-08 ENCOUNTER — Other Ambulatory Visit: Payer: Self-pay

## 2019-11-08 DIAGNOSIS — Z9109 Other allergy status, other than to drugs and biological substances: Secondary | ICD-10-CM

## 2019-11-08 DIAGNOSIS — Z888 Allergy status to other drugs, medicaments and biological substances status: Secondary | ICD-10-CM

## 2019-11-08 DIAGNOSIS — Z881 Allergy status to other antibiotic agents status: Secondary | ICD-10-CM

## 2019-11-08 DIAGNOSIS — I4819 Other persistent atrial fibrillation: Secondary | ICD-10-CM | POA: Diagnosis present

## 2019-11-08 DIAGNOSIS — E876 Hypokalemia: Secondary | ICD-10-CM | POA: Diagnosis present

## 2019-11-08 DIAGNOSIS — I200 Unstable angina: Secondary | ICD-10-CM | POA: Diagnosis present

## 2019-11-08 DIAGNOSIS — I130 Hypertensive heart and chronic kidney disease with heart failure and stage 1 through stage 4 chronic kidney disease, or unspecified chronic kidney disease: Secondary | ICD-10-CM | POA: Diagnosis present

## 2019-11-08 DIAGNOSIS — J449 Chronic obstructive pulmonary disease, unspecified: Secondary | ICD-10-CM | POA: Diagnosis present

## 2019-11-08 DIAGNOSIS — N183 Chronic kidney disease, stage 3 unspecified: Secondary | ICD-10-CM | POA: Diagnosis present

## 2019-11-08 DIAGNOSIS — Z87891 Personal history of nicotine dependence: Secondary | ICD-10-CM

## 2019-11-08 DIAGNOSIS — R079 Chest pain, unspecified: Secondary | ICD-10-CM | POA: Diagnosis not present

## 2019-11-08 DIAGNOSIS — Z90710 Acquired absence of both cervix and uterus: Secondary | ICD-10-CM

## 2019-11-08 DIAGNOSIS — D649 Anemia, unspecified: Secondary | ICD-10-CM | POA: Diagnosis present

## 2019-11-08 DIAGNOSIS — Z880 Allergy status to penicillin: Secondary | ICD-10-CM

## 2019-11-08 DIAGNOSIS — I5032 Chronic diastolic (congestive) heart failure: Secondary | ICD-10-CM | POA: Diagnosis present

## 2019-11-08 DIAGNOSIS — F419 Anxiety disorder, unspecified: Secondary | ICD-10-CM | POA: Diagnosis present

## 2019-11-08 DIAGNOSIS — Z9181 History of falling: Secondary | ICD-10-CM

## 2019-11-08 DIAGNOSIS — Z20822 Contact with and (suspected) exposure to covid-19: Secondary | ICD-10-CM | POA: Diagnosis present

## 2019-11-08 DIAGNOSIS — I25110 Atherosclerotic heart disease of native coronary artery with unstable angina pectoris: Principal | ICD-10-CM | POA: Diagnosis present

## 2019-11-08 DIAGNOSIS — F329 Major depressive disorder, single episode, unspecified: Secondary | ICD-10-CM | POA: Diagnosis present

## 2019-11-08 DIAGNOSIS — Z885 Allergy status to narcotic agent status: Secondary | ICD-10-CM

## 2019-11-08 DIAGNOSIS — I252 Old myocardial infarction: Secondary | ICD-10-CM

## 2019-11-08 DIAGNOSIS — I739 Peripheral vascular disease, unspecified: Secondary | ICD-10-CM | POA: Diagnosis present

## 2019-11-08 DIAGNOSIS — E785 Hyperlipidemia, unspecified: Secondary | ICD-10-CM | POA: Diagnosis present

## 2019-11-08 DIAGNOSIS — I472 Ventricular tachycardia: Secondary | ICD-10-CM | POA: Diagnosis present

## 2019-11-08 DIAGNOSIS — L8990 Pressure ulcer of unspecified site, unspecified stage: Secondary | ICD-10-CM | POA: Insufficient documentation

## 2019-11-08 DIAGNOSIS — F410 Panic disorder [episodic paroxysmal anxiety] without agoraphobia: Secondary | ICD-10-CM | POA: Diagnosis present

## 2019-11-08 DIAGNOSIS — Z955 Presence of coronary angioplasty implant and graft: Secondary | ICD-10-CM

## 2019-11-08 DIAGNOSIS — Z7901 Long term (current) use of anticoagulants: Secondary | ICD-10-CM

## 2019-11-08 DIAGNOSIS — Z79899 Other long term (current) drug therapy: Secondary | ICD-10-CM

## 2019-11-08 DIAGNOSIS — K219 Gastro-esophageal reflux disease without esophagitis: Secondary | ICD-10-CM | POA: Diagnosis present

## 2019-11-08 DIAGNOSIS — Z8249 Family history of ischemic heart disease and other diseases of the circulatory system: Secondary | ICD-10-CM

## 2019-11-08 DIAGNOSIS — M1990 Unspecified osteoarthritis, unspecified site: Secondary | ICD-10-CM | POA: Diagnosis present

## 2019-11-08 DIAGNOSIS — L89322 Pressure ulcer of left buttock, stage 2: Secondary | ICD-10-CM | POA: Diagnosis present

## 2019-11-08 DIAGNOSIS — M109 Gout, unspecified: Secondary | ICD-10-CM | POA: Diagnosis present

## 2019-11-08 DIAGNOSIS — I4891 Unspecified atrial fibrillation: Secondary | ICD-10-CM | POA: Diagnosis not present

## 2019-11-08 LAB — BASIC METABOLIC PANEL
Anion gap: 12 (ref 5–15)
BUN: 21 mg/dL (ref 8–23)
CO2: 24 mmol/L (ref 22–32)
Calcium: 9.2 mg/dL (ref 8.9–10.3)
Chloride: 100 mmol/L (ref 98–111)
Creatinine, Ser: 1.31 mg/dL — ABNORMAL HIGH (ref 0.44–1.00)
GFR calc Af Amer: 43 mL/min — ABNORMAL LOW (ref >60)
GFR calc non Af Amer: 37 mL/min — ABNORMAL LOW (ref >60)
Glucose, Bld: 108 mg/dL — ABNORMAL HIGH (ref 70–99)
Potassium: 3.7 mmol/L (ref 3.5–5.1)
Sodium: 136 mmol/L (ref 135–145)

## 2019-11-08 LAB — CBC
HCT: 30.7 % — ABNORMAL LOW (ref 36.0–46.0)
Hemoglobin: 9.2 g/dL — ABNORMAL LOW (ref 12.0–15.0)
MCH: 24.7 pg — ABNORMAL LOW (ref 26.0–34.0)
MCHC: 30 g/dL (ref 30.0–36.0)
MCV: 82.3 fL (ref 80.0–100.0)
Platelets: 441 10*3/uL — ABNORMAL HIGH (ref 150–400)
RBC: 3.73 MIL/uL — ABNORMAL LOW (ref 3.87–5.11)
RDW: 14.7 % (ref 11.5–15.5)
WBC: 9.2 10*3/uL (ref 4.0–10.5)
nRBC: 0 % (ref 0.0–0.2)

## 2019-11-08 LAB — TROPONIN I (HIGH SENSITIVITY)
Troponin I (High Sensitivity): 7 ng/L (ref <18)
Troponin I (High Sensitivity): 6 ng/L (ref <18)

## 2019-11-08 LAB — SARS CORONAVIRUS 2 BY RT PCR (HOSPITAL ORDER, PERFORMED IN ~~LOC~~ HOSPITAL LAB): SARS Coronavirus 2: NEGATIVE

## 2019-11-08 MED ORDER — SODIUM CHLORIDE 0.9% FLUSH: 3.0000 mL | Freq: Once | INTRAVENOUS | Status: DC

## 2019-11-08 NOTE — ED Notes (Signed)
Call daughter Shiasia Porro when [facility] is transferred [facility] [phone]

## 2019-11-08 NOTE — ED Provider Notes (Signed)
[facility] EMERGENCY DEPARTMENT Provider Note   CSN: [id number] Arrival date & time: 11/08/19  1812     History Chief Complaint  Patient presents with  . Chest Pain    [name] is a 86 y.o. female.  HPI   This patient is an 86 year old female, she has a known history of atrial fibrillation and is currently taking Eliquis.  She also is known to have congestive heart failure, COPD, coronary disease status post stenting with a drug-eluting stent to the left circumflex which was done in 2015.  She had a stent in 2017 showing the stent was patent, she has a known RCA ostial occlusion of 80% which was dealt with medically at the time.  She presents to the hospital today with a complaint of chest pain, this seems to be left-sided to the just left of sternum, feels like a heaviness, states it feels like getting her finger smashed in the door, it is associated with shortness of breath nausea and diaphoresis and the patient states that recently she has becoming very dyspneic on exertion.  She cannot walk from her bedroom to her bathroom without having to stop to take a break because of severe shortness of breath which is unusual for her.  There has been no swelling in her legs and she does not get orthopnea.  This pain awoke her from sleep last night, it has been intermittent throughout the day but gradually worsening and has been persistent for the last hour.  Past Medical History:  Diagnosis Date  . Anxiety   . Arthritis   . Atrial fibrillation ([place])   . Bursitis    Left shoulder  . Cataract   . CHF (congestive heart failure) ([place])   . COPD (chronic obstructive pulmonary disease) ([place])   . Coronary atherosclerosis of native coronary artery    a. s/p DES to LCx in 04/2013 b. cath in 11/2015 showing patent stent with 20% prox-LAD and 80% ostial RCA stenosis for which medical management was recommended due to small artery size  . Depression   . Diastolic heart failure (HCC)    EF  55-60%  . Dysphagia, unspecified(787.20)   . Dyspnea   . Dysrhythmia   . Essential hypertension   . GERD (gastroesophageal reflux disease)    Hx Schatzki's ring, multiple EGD/ED last 01/06/2004  . Gout   . Headache   . History of anemia   . Hyperlipidemia   . Internal hemorrhoids without mention of complication   . MI (myocardial infarction) ([place]) 2006  . Microscopic colitis 2003  . Panic disorder without agoraphobia   . Paresthesia   . Pneumonia 12/2011  . PVD (peripheral vascular disease) ([place])   . S/P colonoscopy 09/27/2001   internal hemorrhoids, desc colon inflam polyp, SB BX-chronic duodenitis, colitis  . Thyroid disease     Patient Active Problem List   Diagnosis Date Noted  . Atrial fibrillation with rapid ventricular response ([place])   . Hypothyroidism 09/19/2019  . AKI (acute kidney injury) ([place]) 10/01/2018  . Persistent atrial fibrillation 12/30/2017  . Acute on chronic combined systolic and diastolic heart failure ([place]) 12/30/2017  . Hyperammonemia ([place]) 02/20/2017  . Myoclonic jerking 02/20/2017  . Atrial fibrillation ([place]) 02/19/2017  . Acute gout 02/17/2017  . Chronic systolic heart failure ([place])   . Chest pain 09/30/2016  . Status post lumbar spine surgery for decompression of spinal cord 08/07/2016  . Lumbosacral spondylosis with radiculopathy 08/01/2016  . Preoperative clearance 07/05/2016  . Cough  05/26/2016  . Weakness 04/26/2016  . Nausea without vomiting 04/26/2016  . Atrial fibrillation with RVR ([place]) 04/15/2016  . DOE (dyspnea on exertion) 03/20/2016  . Acute on chronic diastolic congestive heart failure ([place])   . Chronic kidney disease, stage III (moderate) 02/17/2016  . Essential hypertension 02/17/2016  . COPD exacerbation ([place]) 02/17/2016  . Acute bronchitis 12/18/2015  . CAD in native artery   . Chest pain at rest 10/15/2015  . Chronic low back pain 10/15/2015  . Hyperglycemia 10/15/2015  . Thrombocytosis ([place]) 10/15/2015  . Atypical chest  pain 10/15/2015  . Depression   . Anxiety   . Gastroesophageal reflux disease without esophagitis   . Mild cognitive impairment 10/14/2015  . Iron deficiency anemia due to chronic blood loss   . Meningitis 10/01/2015  . Coronary artery disease due to lipid rich plaque   . NSVT (nonsustained ventricular tachycardia) ([place])   . PAF (paroxysmal atrial fibrillation) ([place])   . CSF leak 09/27/2015  . Spondylolisthesis of lumbar region 09/24/2015  . Gastric polyp   . History of colonic polyps   . Chronic diarrhea   . Esophageal dysphagia 04/01/2015  . PAOD (peripheral arterial occlusive disease) ([place]) 03/05/2015  . Pain in the chest   . Hyperlipidemia   . Weight gain 08/12/2014  . Anemia 08/07/2014  . Hemorrhoids 08/06/2014  . CHF (congestive heart failure) ([place]) 07/29/2014  . Rectal bleeding 07/06/2014  . Constipation 07/06/2014  . Lower extremity edema 11/24/2013  . Acute kidney failure ([place]) 11/24/2013  . Other and unspecified angina pectoris 05/14/2013  . Hematochezia 02/13/2011  . Abdominal pain 02/13/2011  . Major depression ([place]) 09/28/2010  . FATIGUE 04/13/2009  . Chronic diastolic heart failure ([place]) 11/30/2008  . DYSPNEA 11/30/2008  . CAD, NATIVE VESSEL - PCI + DES to left circumflex 05/14/13 11/27/2008  . Peripheral vascular disease ([place]) 11/27/2008  . PANIC ATTACK 02/28/2008  . MI 02/28/2008  . Internal hemorrhoids 02/28/2008  . COLITIS 02/28/2008  . Dysphagia 02/28/2008    Past Surgical History:  Procedure Laterality Date  . ABDOMINAL HYSTERECTOMY    . ABDOMINAL HYSTERECTOMY    . ANTERIOR AND POSTERIOR REPAIR     with resection of vagina  . ANTERIOR LAT LUMBAR FUSION N/A 08/01/2016   Procedure: Lumbar Two-Lumbar Five Transpsoas lateral interbody fusion with Lumbar Two-Three lateral plate fixation;  Surgeon: Kevan Ny Ditty, MD;  Location: [place];  Service: Neurosurgery;  Laterality: N/A;  L2-5 Transpsoas lateral interbody fusion with L2-3 lateral plate  fixation  . APPENDECTOMY    . BACK SURGERY    . BIOPSY  07/05/2015   Procedure: BIOPSY;  Surgeon: Daneil Dolin, MD;  [facility];  Service: Endoscopy;;  gastric polyp biopsy, ascending colon biopsy  . BLADDER SUSPENSION  11/09/2011   Procedure: TRANSVAGINAL TAPE (TVT) PROCEDURE;  Surgeon: Marissa Nestle, MD;  Location: [facility] ORS;  Service: Urology;  Laterality: N/A;  . bladder tack  06/2010  . BREAST LUMPECTOMY  1998   left, benign  . CARDIAC CATHETERIZATION    . CARDIAC CATHETERIZATION    . CARDIAC CATHETERIZATION N/A 12/16/2015   Procedure: Left Heart Cath and Coronary Angiography;  Surgeon: Troy Sine, MD;  Location: [place] CV LAB;  Service: Cardiovascular;  Laterality: N/A;  . CARDIOVERSION N/A 10/04/2017   Procedure: CARDIOVERSION;  Surgeon: Herminio Commons, MD;  Location: [facility] OR[facility];  Service: Cardiovascular;  Laterality: N/A;  . CARDIOVERSION N/A 01/30/2018   Procedure: CARDIOVERSION;  Surgeon: Herminio Commons,  MD;  [facility];  Service: Cardiovascular;  Laterality: N/A;  . [place]   left  . cataract surgery    . CHOLECYSTECTOMY  1998  . Cholecystectomy    . COLONOSCOPY  03/16/2011   multiple hyperplastic colon polyps, sigmoid diverticulosis, melanosis coli  . COLONOSCOPY WITH PROPOFOL N/A 07/05/2015   RMR:one 5 mm polyp in descending colon  . CORONARY ANGIOGRAPHY N/A 05/16/2018   Procedure: CORONARY ANGIOGRAPHY (CATH LAB);  Surgeon: Belva Crome, MD;  Location: [place] CV LAB;  Service: Cardiovascular;  Laterality: N/A;  . CORONARY ANGIOPLASTY WITH STENT PLACEMENT    . ESOPHAGEAL DILATION N/A 07/05/2015   Procedure: ESOPHAGEAL DILATION;  Surgeon: Daneil Dolin, MD;  [facility];  Service: Endoscopy;  Laterality: N/A;  . ESOPHAGOGASTRODUODENOSCOPY (EGD) WITH PROPOFOL N/A 07/05/2015   [id number]  . JOINT REPLACEMENT Right 2007   right knee  . left hand surgery    . LEFT HEART CATHETERIZATION WITH  CORONARY ANGIOGRAM N/A 05/14/2013   Procedure: LEFT HEART CATHETERIZATION WITH CORONARY ANGIOGRAM;  Surgeon: Blane Ohara, MD;  Location[facility] C[facility] LAB;  Service: Cardiovascular;  Laterality: N/A;  . left rotator cuff surgery    . LUMBAR LAMINECTOMY/DECOMPRESSION MICRODISCECTOMY N/A 10/11/2012   Procedure: LUMBAR LAMINECTOMY/DECOMPRESSION MICRODISCECTOMY 2 LEVELS;  Surgeon: Floyce Stakes, MD;  Location: [place] NEURO ORS;  Service: Neurosurgery;  Laterality: N/A;  L3-4 L4-5 Laminectomy  . LUMBAR WOUND DEBRIDEMENT N/A 09/27/2015   Procedure: Exploration of Lumbar Wound w/ Repair CSF Leak/Lumbar Drain Placement;  Surgeon: Leeroy Cha, MD;  Location: [place] NEURO ORS;  Service: Neurosurgery;  Laterality: N/A;  . MALONEY DILATION  03/16/2011   Gastritis. No H.pylori on bx. 62F maloney dilation with disruption of  occult cevical esophageal web  . NASAL SINUS SURGERY    . right knee replacement  2007  . right leg benign tumor    . SHOULDER SURGERY Left   . TEE WITHOUT CARDIOVERSION N/A 10/04/2017   Procedure: TRANSESOPHAGEAL ECHOCARDIOGRAM (TEE) WITH PROPOFOL;  Surgeon: Herminio Commons, MD;  Location: [facility];  Service: Cardiovascular;  Laterality: N/A;  . TONSILLECTOMY    . unspecified area, hysterectomy  1972   partial     OB History    Gravida  8   Para  6   Term      Preterm      AB  2   Living  5     SAB      TAB      Ectopic      Multiple      Live Births              Family History  Problem Relation Age of Onset  . Stroke Mother   . Parkinson's disease Father   . Coronary artery disease Other        family Hx-sons  . Cancer Other   . Stroke Other        family Hx  . Hypertension Other        family Hx  . Diabetes Brother   . Heart disease Son        before age 71  . Diabetes Son   . Stroke Daughter 46  . Colon cancer Neg Hx     Social History   Tobacco Use  . Smoking status: Former Smoker    Packs/day: 1.00    Years: 64.00    Pack years: 64.00     Types:  Cigarettes    Start date: 12/24/1947    Quit date: 11/17/2001    Years since quitting: 17.9  . Smokeless tobacco: Never Used  . Tobacco comment: Quit smoking in 2003  Vaping Use  . Vaping Use: Never used  Substance Use Topics  . Alcohol use: No    Alcohol/week: 0.0 standard drinks  . Drug use: No    Home Medications Prior to Admission medications   Medication Sig Start Date End Date Taking? Authorizing Provider  acetaminophen (TYLENOL) 500 MG tablet Take 500 mg by mouth every 6 (six) hours as needed for headache.     [provider]  ALLERGY RELIEF 10 MG tablet Take 10 mg by mouth daily. 06/25/19   [provider]  ALPRAZolam Duanne Moron) 0.25 MG tablet Take 0.25 mg by mouth 2 (two) times daily as needed for anxiety.  09/04/16   [provider]  apixaban (ELIQUIS) 5 MG TABS tablet Take 1 tablet (5 mg total) by mouth 2 (two) times daily. 07/14/19   Evans Lance, MD  diltiazem (CARDIZEM CD) 180 MG 24 hr capsule Take 1 capsule (180 mg total) by mouth daily. 09/24/19   Orson Eva, MD  DULoxetine (CYMBALTA) 60 MG capsule Take 60 mg by mouth at bedtime.     [provider]  fluticasone (CUTIVATE) 0.05 % cream Apply 1 application topically daily. 07/15/19   [provider]  fluticasone (FLONASE) 50 MCG/ACT nasal spray Place 2 sprays into both nostrils 2 (two) times daily as needed for allergies.     [provider]  furosemide (LASIX) 40 MG tablet Take 1 tablet (40 mg total) by mouth daily. 09/24/19   Orson Eva, MD  ipratropium-albuterol (DUONEB) 0.5-2.5 (3) MG/3ML SOLN Take 3 mLs by nebulization every 6 (six) hours. 12/18/15   Regalado, Belkys A, MD  isosorbide mononitrate (IMDUR) 60 MG 24 hr tablet Take 1 tablet (60 mg total) by mouth daily. 02/21/19 05/28/20  Strader, Fransisco Hertz, PA-C  levothyroxine (SYNTHROID, LEVOTHROID) 25 MCG tablet Take 1 tablet by mouth daily. 06/07/18   [provider]  magnesium oxide (MAG-OX) 400 MG tablet TAKE  ONE TABLET BY MOUTH DAILY. 10/21/19   Verta Ellen., NP  methocarbamol (ROBAXIN) 500 MG tablet Take 500 mg by mouth every 6 (six) hours as needed for muscle spasms.     [provider]  metoprolol succinate (TOPROL-XL) 50 MG 24 hr tablet Take 1 1/2 tabs by mouth daily 05/30/19   Herminio Commons, MD  Multiple Vitamin (MULTIVITAMIN WITH MINERALS) TABS tablet Take 1 tablet by mouth daily. Centrum     [provider]  nitroGLYCERIN (NITROSTAT) 0.4 MG SL tablet Place 1 tablet (0.4 mg total) under the tongue every 5 (five) minutes as needed for chest pain. Reported on 08/04/2015 11/20/17   Herminio Commons, MD  pantoprazole (PROTONIX) 40 MG tablet Take 40 mg by mouth daily.     [provider]  potassium chloride (KLOR-CON) 10 MEQ tablet Take 1 tablet (10 mEq total) by mouth daily. 06/17/19   Herminio Commons, MD  sodium chloride (OCEAN) 0.65 % SOLN nasal spray Place 1 spray into both nostrils as needed (allergies).     [provider]    Allergies    Cephalosporins, Levaquin [levofloxacin in d5w], Macrodantin [nitrofurantoin macrocrystal], Phenothiazines, Polysorbate, Prednisone, Buspirone, Cardura [doxazosin mesylate], Codeine, Acyclovir and related, Colcrys [colchicine], Prochlorperazine, Ranexa [ranolazine], Atorvastatin, Ofloxacin, Other, Penicillins, and Pimozide  Review of Systems   Review of Systems  All other systems reviewed and are negative.   Physical Exam Updated Vital Signs BP (!) 135/65 (BP Location: Left Arm)   Pulse 79   Temp 98 F (36.7 C) (Oral)   Resp 18   Ht 1.549 m (5\' 1" )   Wt 73.6 kg   SpO2 98%   BMI 30.65 kg/m   Physical Exam Vitals and nursing note reviewed.  Constitutional:      General: She is not in acute distress.    Appearance: She is well-developed.  HENT:     Head: Normocephalic and atraumatic.     Mouth/Throat:     Pharynx: No oropharyngeal exudate.  Eyes:     General: No scleral icterus.       Right  eye: No discharge.        Left eye: No discharge.     Conjunctiva/sclera: Conjunctivae normal.     Pupils: Pupils are equal, round, and reactive to light.  Neck:     Thyroid: No thyromegaly.     Vascular: No JVD.  Cardiovascular:     Rate and Rhythm: Normal rate and regular rhythm.     Heart sounds: Normal heart sounds. No murmur heard.  No friction rub. No gallop.   Pulmonary:     Effort: Pulmonary effort is normal. No respiratory distress.     Breath sounds: Normal breath sounds. No wheezing or rales.  Abdominal:     General: Bowel sounds are normal. There is no distension.     Palpations: Abdomen is soft. There is no mass.     Tenderness: There is no abdominal tenderness.  Musculoskeletal:        General: No tenderness. Normal range of motion.     Cervical back: Normal range of motion and neck supple.  Lymphadenopathy:     Cervical: No cervical adenopathy.  Skin:    General: Skin is warm and dry.     Findings: No erythema or rash.  Neurological:     Mental Status: She is alert.     Coordination: Coordination normal.  Psychiatric:        Behavior: Behavior normal.     ED Results / Procedures / Treatments   Labs (all labs ordered are listed, but only abnormal results are displayed) Labs Reviewed  CBC - Abnormal; Notable for the following components:      Result Value   RBC 3.73 (*)    Hemoglobin 9.2 (*)    HCT 30.7 (*)    MCH 24.7 (*)    Platelets 441 (*)    All other components within normal limits  BASIC METABOLIC PANEL  TROPONIN I (HIGH SENSITIVITY)  TROPONIN I (HIGH SENSITIVITY)    EKG EKG Interpretation  Date/Time:  Saturday November 08 2019 18:24:51 EDT Ventricular Rate:  62 PR Interval:    QRS Duration: 72 QT Interval:  454 QTC Calculation: 460 R Axis:   -22 Text Interpretation: Atrial fibrillation Low voltage QRS Cannot rule out Anterior infarct , age undetermined ST & T wave abnormality, consider inferior ischemia Abnormal ECG Since last tracing rate  slower Confirmed by Noemi Chapel [phone]) on 11/08/2019 8:04:59 PM   Radiology DG Chest Portable 1 View  Result Date: 11/08/2019 CLINICAL DATA:  Chest pain, short of breath, atrial fibrillation, previous tobacco abuse EXAM: PORTABLE CHEST 1 VIEW COMPARISON:  09/19/2019 FINDINGS: Single frontal view of the chest demonstrates a stable cardiac silhouette. There is chronic interstitial prominence, consistent with history of tobacco abuse. No acute airspace disease, effusion, or  pneumothorax. No acute bony abnormalities. IMPRESSION: 1. Chronic interstitial lung disease.  No acute process. Electronically Signed   By: Randa Ngo M.D.   On: 11/08/2019 19:11    Procedures .Critical Care Performed by: Noemi Chapel, MD Authorized by: Noemi Chapel, MD   Critical care provider statement:    Critical care time (minutes):  35   Critical care time was exclusive of:  Separately billable procedures and treating other patients and teaching time   Critical care was necessary to treat or prevent imminent or life-threatening deterioration of the following conditions:  Circulatory failure   Critical care was time spent personally by me on the following activities:  Blood draw for specimens, development of treatment plan with patient or surrogate, discussions with consultants, evaluation of patient's response to treatment, examination of patient, obtaining history from patient or surrogate, ordering and performing treatments and interventions, ordering and review of laboratory studies, ordering and review of radiographic studies, pulse oximetry, re-evaluation of patient's condition and review of old charts   (including critical care time)  Medications Ordered in ED Medications  sodium chloride flush (NS) 0.9 % injection 3 mL (has no administration in time range)    ED Course  I have reviewed the triage vital signs and the nursing notes.  Pertinent labs & imaging results that were available during my care  of the patient were reviewed by me and considered in my medical decision making (see chart for details).    MDM Rules/Calculators/A&P                          This patient appears uncomfortable, her story is consistent with unstable angina or potentially a non-ST elevation MI.  Troponin has been ordered, chest x-ray shows some interstitial lung disease but no signs of acute infiltrate or pneumothorax.  Her EKG shows atrial fibrillation but no signs of ST elevation or depression.  This patient is high risk for recurrent disease, she has known disease of her right coronary which has been medically managed thus far but at this time likely needs to have admission to the hospital and cardiology evaluation.  Her hemoglobin is 9.2, she has no leukocytosis.  I have discussed the care with the on-call cardiologist, "Adam", appreciative of his recommendations.  He agrees that the patient be transferred to [facility].  Accepting cardiologist would be Dr. Margaretann Loveless, recommends against beta-blocker or statin therapy at this time, he does agree with heparin if troponin turns positive and nitroglycerin as needed for pain.  Thankfully initial troponin is negative, metabolic panel with a creatinine of 1.3 but otherwise unremarkable  [name] was evaluated in Emergency Department on 11/08/2019 for the symptoms described in the history of present illness. She was evaluated in the context of the global COVID-19 pandemic, which necessitated consideration that the patient might be at risk for infection with the SARS-CoV-2 virus that causes COVID-19. Institutional protocols and algorithms that pertain to the evaluation of patients at risk for COVID-19 are in a state of rapid change based on information released by regulatory bodies including the CDC and federal and state organizations. These policies and algorithms were followed during the patient's care in the ED.   Final Clinical Impression(s) / ED  Diagnoses Final diagnoses:  Unstable angina (HCC)      Noemi Chapel, MD 11/08/19 2138

## 2019-11-08 NOTE — ED Notes (Signed)
Pt with CP and shortness of breath since yesterday   Took a NTG SL last night   Reports it helped   Took none today   Daughter took off of work to bring to ED for eval

## 2019-11-08 NOTE — ED Triage Notes (Signed)
Pt has multiple complaints; pt c/o chest pain that radiates down her left arm and pt states her head feels "funny"; pt c/o sob and states she took a nitro before going to bed last night and it helped with the pain; pt states she feels sob with exertion

## 2019-11-09 ENCOUNTER — Observation Stay (HOSPITAL_COMMUNITY): Payer: Medicare HMO

## 2019-11-09 DIAGNOSIS — M1990 Unspecified osteoarthritis, unspecified site: Secondary | ICD-10-CM | POA: Diagnosis not present

## 2019-11-09 DIAGNOSIS — D649 Anemia, unspecified: Secondary | ICD-10-CM | POA: Diagnosis not present

## 2019-11-09 DIAGNOSIS — F329 Major depressive disorder, single episode, unspecified: Secondary | ICD-10-CM | POA: Diagnosis not present

## 2019-11-09 DIAGNOSIS — Z20822 Contact with and (suspected) exposure to covid-19: Secondary | ICD-10-CM | POA: Diagnosis not present

## 2019-11-09 DIAGNOSIS — Z9181 History of falling: Secondary | ICD-10-CM | POA: Diagnosis not present

## 2019-11-09 DIAGNOSIS — J449 Chronic obstructive pulmonary disease, unspecified: Secondary | ICD-10-CM | POA: Diagnosis not present

## 2019-11-09 DIAGNOSIS — K219 Gastro-esophageal reflux disease without esophagitis: Secondary | ICD-10-CM | POA: Diagnosis not present

## 2019-11-09 DIAGNOSIS — I4891 Unspecified atrial fibrillation: Secondary | ICD-10-CM | POA: Diagnosis not present

## 2019-11-09 DIAGNOSIS — I472 Ventricular tachycardia: Secondary | ICD-10-CM | POA: Diagnosis not present

## 2019-11-09 DIAGNOSIS — F419 Anxiety disorder, unspecified: Secondary | ICD-10-CM | POA: Diagnosis not present

## 2019-11-09 DIAGNOSIS — I5032 Chronic diastolic (congestive) heart failure: Secondary | ICD-10-CM | POA: Diagnosis not present

## 2019-11-09 DIAGNOSIS — L89322 Pressure ulcer of left buttock, stage 2: Secondary | ICD-10-CM | POA: Diagnosis not present

## 2019-11-09 DIAGNOSIS — I200 Unstable angina: Secondary | ICD-10-CM

## 2019-11-09 DIAGNOSIS — E876 Hypokalemia: Secondary | ICD-10-CM | POA: Diagnosis not present

## 2019-11-09 DIAGNOSIS — I4819 Other persistent atrial fibrillation: Secondary | ICD-10-CM | POA: Diagnosis not present

## 2019-11-09 DIAGNOSIS — E785 Hyperlipidemia, unspecified: Secondary | ICD-10-CM | POA: Diagnosis not present

## 2019-11-09 DIAGNOSIS — Z87891 Personal history of nicotine dependence: Secondary | ICD-10-CM | POA: Diagnosis not present

## 2019-11-09 DIAGNOSIS — Z90710 Acquired absence of both cervix and uterus: Secondary | ICD-10-CM | POA: Diagnosis not present

## 2019-11-09 DIAGNOSIS — I252 Old myocardial infarction: Secondary | ICD-10-CM | POA: Diagnosis not present

## 2019-11-09 DIAGNOSIS — R079 Chest pain, unspecified: Secondary | ICD-10-CM

## 2019-11-09 DIAGNOSIS — I739 Peripheral vascular disease, unspecified: Secondary | ICD-10-CM | POA: Diagnosis not present

## 2019-11-09 DIAGNOSIS — N183 Chronic kidney disease, stage 3 unspecified: Secondary | ICD-10-CM | POA: Diagnosis not present

## 2019-11-09 DIAGNOSIS — Z955 Presence of coronary angioplasty implant and graft: Secondary | ICD-10-CM | POA: Diagnosis not present

## 2019-11-09 DIAGNOSIS — I25110 Atherosclerotic heart disease of native coronary artery with unstable angina pectoris: Secondary | ICD-10-CM | POA: Diagnosis not present

## 2019-11-09 DIAGNOSIS — M109 Gout, unspecified: Secondary | ICD-10-CM | POA: Diagnosis not present

## 2019-11-09 DIAGNOSIS — L8990 Pressure ulcer of unspecified site, unspecified stage: Secondary | ICD-10-CM | POA: Insufficient documentation

## 2019-11-09 DIAGNOSIS — F410 Panic disorder [episodic paroxysmal anxiety] without agoraphobia: Secondary | ICD-10-CM | POA: Diagnosis not present

## 2019-11-09 DIAGNOSIS — I130 Hypertensive heart and chronic kidney disease with heart failure and stage 1 through stage 4 chronic kidney disease, or unspecified chronic kidney disease: Secondary | ICD-10-CM | POA: Diagnosis not present

## 2019-11-09 LAB — RETICULOCYTES
Immature Retic Fract: 26.4 % — ABNORMAL HIGH (ref 2.3–15.9)
RBC.: 3.86 MIL/uL — ABNORMAL LOW (ref 3.87–5.11)
Retic Count, Absolute: 71.8 10*3/uL (ref 19.0–186.0)
Retic Ct Pct: 1.9 % (ref 0.4–3.1)

## 2019-11-09 LAB — NM MYOCAR MULTI W/SPECT W/WALL MOTION / EF
Estimated workload: 1 METS
Exercise duration (min): 7 min
Exercise duration (sec): 14 s
MPHR: 135 {beats}/min
Peak HR: 88 {beats}/min
Percent HR: 65 %
Rest HR: 69 {beats}/min
TID: 1.06

## 2019-11-09 LAB — BRAIN NATRIURETIC PEPTIDE: B Natriuretic Peptide: 365.4 pg/mL — ABNORMAL HIGH (ref 0.0–100.0)

## 2019-11-09 LAB — IRON AND TIBC
Iron: 22 ug/dL — ABNORMAL LOW (ref 28–170)
Saturation Ratios: 5 % — ABNORMAL LOW (ref 10.4–31.8)
TIBC: 470 ug/dL — ABNORMAL HIGH (ref 250–450)
UIBC: 448 ug/dL

## 2019-11-09 LAB — FERRITIN: Ferritin: 12 ng/mL (ref 11–307)

## 2019-11-09 LAB — GLUCOSE, CAPILLARY: Glucose-Capillary: 98 mg/dL (ref 70–99)

## 2019-11-09 LAB — FOLATE: Folate: 13.1 ng/mL (ref >5.9)

## 2019-11-09 LAB — HEMOGLOBIN A1C
Hgb A1c MFr Bld: 7 % — ABNORMAL HIGH (ref 4.8–5.6)
Mean Plasma Glucose: 154.2 mg/dL

## 2019-11-09 LAB — VITAMIN B12: Vitamin B-12: 255 pg/mL (ref 180–914)

## 2019-11-09 MED ORDER — ISOSORBIDE MONONITRATE ER 60 MG PO TB24
60.0000 mg | ORAL_TABLET | Freq: Every day | ORAL | Status: DC
  Administered 2019-11-09 – 2019-11-11 (×4): 60 mg via ORAL
  Filled 2019-11-09 (×3): qty 1

## 2019-11-09 MED ORDER — APIXABAN 5 MG PO TABS
5.0000 mg | ORAL_TABLET | Freq: Two times a day (BID) | ORAL | Status: DC
  Administered 2019-11-09 – 2019-11-11 (×5): 5 mg via ORAL
  Filled 2019-11-09 (×5): qty 1

## 2019-11-09 MED ORDER — ASPIRIN EC 81 MG PO TBEC
81.0000 mg | DELAYED_RELEASE_TABLET | Freq: Every day | ORAL | Status: DC
  Administered 2019-11-09: 81 mg via ORAL
  Filled 2019-11-09: qty 1

## 2019-11-09 MED ORDER — FUROSEMIDE 10 MG/ML IJ SOLN
40.0000 mg | Freq: Once | INTRAMUSCULAR | Status: AC
  Administered 2019-11-09: 40 mg via INTRAVENOUS
  Filled 2019-11-09: qty 4

## 2019-11-09 MED ORDER — ALPRAZOLAM 0.5 MG PO TABS: 0.5000 mg | ORAL_TABLET | Freq: Two times a day (BID) | ORAL | Status: DC | PRN

## 2019-11-09 MED ORDER — REGADENOSON 0.4 MG/5ML IV SOLN
INTRAVENOUS | Status: AC
  Filled 2019-11-09: qty 5

## 2019-11-09 MED ORDER — DILTIAZEM HCL ER COATED BEADS 180 MG PO CP24
180.0000 mg | ORAL_CAPSULE | Freq: Every day | ORAL | Status: DC
  Administered 2019-11-09 – 2019-11-11 (×3): 180 mg via ORAL
  Filled 2019-11-09 (×3): qty 1

## 2019-11-09 MED ORDER — ACETAMINOPHEN 325 MG PO TABS
650.0000 mg | ORAL_TABLET | ORAL | Status: DC | PRN
  Administered 2019-11-10: 650 mg via ORAL
  Filled 2019-11-09: qty 2

## 2019-11-09 MED ORDER — METOPROLOL SUCCINATE ER 50 MG PO TB24
75.0000 mg | ORAL_TABLET | Freq: Every day | ORAL | Status: DC
  Administered 2019-11-09 – 2019-11-11 (×3): 75 mg via ORAL
  Filled 2019-11-09 (×3): qty 1

## 2019-11-09 MED ORDER — TECHNETIUM TC 99M TETROFOSMIN IV KIT
33.0000 | PACK | Freq: Once | INTRAVENOUS | Status: AC | PRN
  Administered 2019-11-09: 33 via INTRAVENOUS

## 2019-11-09 MED ORDER — REGADENOSON 0.4 MG/5ML IV SOLN
0.4000 mg | Freq: Once | INTRAVENOUS | Status: AC
  Administered 2019-11-09: 0.4 mg via INTRAVENOUS
  Filled 2019-11-09: qty 5

## 2019-11-09 MED ORDER — DULOXETINE HCL 60 MG PO CPEP
60.0000 mg | ORAL_CAPSULE | Freq: Every day | ORAL | Status: DC
  Administered 2019-11-09 – 2019-11-10 (×2): 60 mg via ORAL
  Filled 2019-11-09 (×2): qty 1

## 2019-11-09 MED ORDER — ONDANSETRON HCL 4 MG/2ML IJ SOLN: 4.0000 mg | Freq: Four times a day (QID) | INTRAMUSCULAR | Status: DC | PRN

## 2019-11-09 MED ORDER — PANTOPRAZOLE SODIUM 40 MG PO TBEC
40.0000 mg | DELAYED_RELEASE_TABLET | Freq: Every day | ORAL | Status: DC
  Administered 2019-11-09 – 2019-11-11 (×3): 40 mg via ORAL
  Filled 2019-11-09 (×3): qty 1

## 2019-11-09 MED ORDER — FUROSEMIDE 40 MG PO TABS
40.0000 mg | ORAL_TABLET | Freq: Every day | ORAL | Status: DC
  Administered 2019-11-09 – 2019-11-11 (×3): 40 mg via ORAL
  Filled 2019-11-09 (×3): qty 1

## 2019-11-09 MED ORDER — NITROGLYCERIN 0.4 MG SL SUBL: 0.4000 mg | SUBLINGUAL_TABLET | SUBLINGUAL | Status: DC | PRN

## 2019-11-09 NOTE — Progress Notes (Signed)
Progress Note  Patient Name: [name] Date of Encounter: 11/09/2019  [facility] Cardiologist: Kate Sable, MD (Inactive)   Patient Profile     86 y.o. female admitted with chest pain sob and diaphoresis. Underwent cath 2020 with similar (qualitative) complaints>> no obstructive disease Remote Cx stenting  Afib on Apixoban    09/20/19 Echo  EF 60-65, mild LVH 05/16/18 LHC 20% LAD, 30% LCx, nondominant RCA with 90% ostial lesion. 07/02/2017 SPECT no ischemia  Stress test this am, imaging negative Subjective    very sob on walking back from the bathroom this evening  Hx reviewed and worsening sob over the last 12 months, much worse over the last 1 month with worsening orthopnea over 2 months and some abd distension   Dizziness with standing and falls x 2   Inpatient Medications    Scheduled Meds: . apixaban  5 mg Oral BID  . [START ON 11/10/2019] aspirin EC  81 mg Oral Daily  . diltiazem  180 mg Oral Daily  . DULoxetine  60 mg Oral QHS  . furosemide  40 mg Oral Daily  . isosorbide mononitrate  60 mg Oral Daily  . metoprolol succinate  75 mg Oral Daily  . pantoprazole  40 mg Oral Daily  . regadenoson      . sodium chloride flush  3 mL Intravenous Once   Continuous Infusions:  PRN Meds: acetaminophen, ALPRAZolam, nitroGLYCERIN, ondansetron (ZOFRAN) IV   Vital Signs    Vitals:   11/09/19 1102 11/09/19 1140 11/09/19 1142 11/09/19 1144  BP: (!) 168/77 (!) 140/61 (!) 139/82 (!) 147/81  Pulse:      Resp:      Temp:      TempSrc:      SpO2:      Weight:      Height:        Intake/Output Summary (Last 24 hours) at 11/09/2019 1157 Last data filed at 11/09/2019 0300 Gross per 24 hour  Intake 0 ml  Output 0 ml  Net 0 ml   Last 3 Weights 11/09/2019 11/08/2019 10/06/2019  Weight (lbs) 161 lb 8 oz 162 lb 3.2 oz 167 lb 3.2 oz  Weight (kg) 73.256 kg 73.573 kg 75.841 kg      Telemetry    Atrial fib with RVR  - Personally Reviewed  ECG    multiple-  Personally Reviewed 7/21 afib 6/21 afib 6/20 sinus  Physical Exam    GEN: No mod distress.   Neck: JVP 10+ HJR  Cardiac: irregularly irregular , no murmurs, rubs, or gallops.  Respiratory: Clear to auscultation bilaterally even with dyspnea  GI: Soft, nontender, non-distended  MS: No edema; No deformity. Neuro:  Nonfocal  Psych: Normal affect   Labs    High Sensitivity Troponin:   Recent Labs  Lab 11/08/19 1953 11/08/19 2110  TROPONINIHS 7 6      Chemistry Recent Labs  Lab 11/08/19 1953  NA 136  K 3.7  CL 100  CO2 24  GLUCOSE 108*  BUN 21  CREATININE 1.31*  CALCIUM 9.2  GFRNONAA 37*  GFRAA 43*  ANIONGAP 12     Hematology Recent Labs  Lab 11/08/19 1953  WBC 9.2  RBC 3.73*  HGB 9.2*  HCT 30.7*  MCV 82.3  MCH 24.7*  MCHC 30.0  RDW 14.7  PLT 441*      Radiology    DG Chest Portable 1 View  Result Date: 11/08/2019 CLINICAL DATA:  Chest pain, short of breath,  atrial fibrillation, previous tobacco abuse EXAM: PORTABLE CHEST 1 VIEW COMPARISON:  09/19/2019 FINDINGS: Single frontal view of the chest demonstrates a stable cardiac silhouette. There is chronic interstitial prominence, consistent with history of tobacco abuse. No acute airspace disease, effusion, or pneumothorax. No acute bony abnormalities. IMPRESSION: 1. Chronic interstitial lung disease.  No acute process. Electronically Signed   By: Randa Ngo M.D.   On: 11/08/2019 19:11      Cardiac Studies   Myoview pending    Assessment & Plan    CHF acute.chronic class 3b  Atrial fib persistent   CAD with remote (2015) Cx stenting  Anemia    Falls and orthostatic lightheadedness   Await myoview -- doubt ischemia  Check lipids and with anemia check iron labs:   On Apixoban but missed last nights dose;  So will need TEE DCCV  Her dyspnea is impressive and even then was without wheezing and suspect this is primarily HFpEF and with hypertensive heart disease, ought to improve with  restoration of sinus rhythm.  Will check BNP tonight and also give furosemide 40 IV overnight  Will do orthostatic VS to try and clarify her lightheadedness but concerning esp given anemia and falls         For questions or updates, please contact [place] Please consult [email or web address] for contact info under        Signed, Virl Axe, MD  11/09/2019, 11:57 AM

## 2019-11-09 NOTE — H&P (View-Only) (Signed)
Progress Note  Patient Name: [name] Date of Encounter: 11/09/2019  [facility] Cardiologist: Kate Sable, MD (Inactive)   Patient Profile     86 y.o. female admitted with chest pain sob and diaphoresis. Underwent cath 2020 with similar (qualitative) complaints>> no obstructive disease Remote Cx stenting  Afib on Apixoban    09/20/19 Echo  EF 60-65, mild LVH 05/16/18 LHC 20% LAD, 30% LCx, nondominant RCA with 90% ostial lesion. 07/02/2017 SPECT no ischemia  Stress test this am, imaging negative Subjective    very sob on walking back from the bathroom this evening  Hx reviewed and worsening sob over the last 12 months, much worse over the last 1 month with worsening orthopnea over 2 months and some abd distension   Dizziness with standing and falls x 2   Inpatient Medications    Scheduled Meds: . apixaban  5 mg Oral BID  . [START ON 11/10/2019] aspirin EC  81 mg Oral Daily  . diltiazem  180 mg Oral Daily  . DULoxetine  60 mg Oral QHS  . furosemide  40 mg Oral Daily  . isosorbide mononitrate  60 mg Oral Daily  . metoprolol succinate  75 mg Oral Daily  . pantoprazole  40 mg Oral Daily  . regadenoson      . sodium chloride flush  3 mL Intravenous Once   Continuous Infusions:  PRN Meds: acetaminophen, ALPRAZolam, nitroGLYCERIN, ondansetron (ZOFRAN) IV   Vital Signs    Vitals:   11/09/19 1102 11/09/19 1140 11/09/19 1142 11/09/19 1144  BP: (!) 168/77 (!) 140/61 (!) 139/82 (!) 147/81  Pulse:      Resp:      Temp:      TempSrc:      SpO2:      Weight:      Height:        Intake/Output Summary (Last 24 hours) at 11/09/2019 1157 Last data filed at 11/09/2019 0300 Gross per 24 hour  Intake 0 ml  Output 0 ml  Net 0 ml   Last 3 Weights 11/09/2019 11/08/2019 10/06/2019  Weight (lbs) 161 lb 8 oz 162 lb 3.2 oz 167 lb 3.2 oz  Weight (kg) 73.256 kg 73.573 kg 75.841 kg      Telemetry    Atrial fib with RVR  - Personally Reviewed  ECG    multiple-  Personally Reviewed 7/21 afib 6/21 afib 6/20 sinus  Physical Exam    GEN: No mod distress.   Neck: JVP 10+ HJR  Cardiac: irregularly irregular , no murmurs, rubs, or gallops.  Respiratory: Clear to auscultation bilaterally even with dyspnea  GI: Soft, nontender, non-distended  MS: No edema; No deformity. Neuro:  Nonfocal  Psych: Normal affect   Labs    High Sensitivity Troponin:   Recent Labs  Lab 11/08/19 1953 11/08/19 2110  TROPONINIHS 7 6      Chemistry Recent Labs  Lab 11/08/19 1953  NA 136  K 3.7  CL 100  CO2 24  GLUCOSE 108*  BUN 21  CREATININE 1.31*  CALCIUM 9.2  GFRNONAA 37*  GFRAA 43*  ANIONGAP 12     Hematology Recent Labs  Lab 11/08/19 1953  WBC 9.2  RBC 3.73*  HGB 9.2*  HCT 30.7*  MCV 82.3  MCH 24.7*  MCHC 30.0  RDW 14.7  PLT 441*      Radiology    DG Chest Portable 1 View  Result Date: 11/08/2019 CLINICAL DATA:  Chest pain, short of breath,  atrial fibrillation, previous tobacco abuse EXAM: PORTABLE CHEST 1 VIEW COMPARISON:  09/19/2019 FINDINGS: Single frontal view of the chest demonstrates a stable cardiac silhouette. There is chronic interstitial prominence, consistent with history of tobacco abuse. No acute airspace disease, effusion, or pneumothorax. No acute bony abnormalities. IMPRESSION: 1. Chronic interstitial lung disease.  No acute process. Electronically Signed   By: Randa Ngo M.D.   On: 11/08/2019 19:11      Cardiac Studies   Myoview pending    Assessment & Plan    CHF acute.chronic class 3b  Atrial fib persistent   CAD with remote (2015) Cx stenting  Anemia    Falls and orthostatic lightheadedness   Await myoview -- doubt ischemia  Check lipids and with anemia check iron labs:   On Apixoban but missed last nights dose;  So will need TEE DCCV  Her dyspnea is impressive and even then was without wheezing and suspect this is primarily HFpEF and with hypertensive heart disease, ought to improve with  restoration of sinus rhythm.  Will check BNP tonight and also give furosemide 40 IV overnight  Will do orthostatic VS to try and clarify her lightheadedness but concerning esp given anemia and falls         For questions or updates, please contact [place] Please consult [email or web address] for contact info under        Signed, Virl Axe, MD  11/09/2019, 11:57 AM

## 2019-11-09 NOTE — ED Notes (Signed)
[facility] arrived  

## 2019-11-09 NOTE — H&P (Signed)
Cardiology History & Physical    Patient ID: [name] MRN: [id number]; DOB: 1932-12-22   Admission date: 11/08/2019  Primary Care Provider: Rosalee Kaufman, PA-C Primary Cardiologist: Kate Sable, MD (Inactive)  Primary Electrophysiologist:  Cristopher Peru, MD   Chief Complaint:  Chest pain  Patient Profile:   [name] is a 86 y.o. female with persistent AF, NSVT, HTN, CAD s/p PCI to LCX in 2015, HFpEF who presents with chest pain.  History of Present Illness:   She presented to the hospital with a complaint of chest pain, this seems to be left-sided to the just left of sternum, feels like a heaviness, states it feels like getting her finger smashed in the door, it is associated with shortness of breath nausea and diaphoresis and the patient states that recently she has becoming very dyspneic on exertion.  She cannot walk from her bedroom to her bathroom without having to stop to take a break because of severe shortness of breath which is unusual for her.  There has been no swelling in her legs and she does not get orthopnea.  This pain awoke her from sleep, it has been intermittent throughout the day but gradually worsening and has been persistent for the last hour.  In the ED, VS unremarkable and troponin normal x2.  ECG with sinus rhythm and no ischemia.  09/20/19 Echo  EF 60-65, mild LVH 05/16/18 LHC 20% LAD, 30% LCx, nondominant RCA with 90% ostial lesion. 07/02/2017 SPECT no ischemia  Heart Pathway Score:      Past Medical History:  Diagnosis Date  . Anxiety   . Arthritis   . Atrial fibrillation ([place])   . Bursitis    Left shoulder  . Cataract   . CHF (congestive heart failure) ([place])   . COPD (chronic obstructive pulmonary disease) ([place])   . Coronary atherosclerosis of native coronary artery    a. s/p DES to LCx in 04/2013 b. cath in 11/2015 showing patent stent with 20% prox-LAD and 80% ostial RCA stenosis for which medical management was  recommended due to small artery size  . Depression   . Diastolic heart failure ([facility])    EF 55-60%  . Dysphagia, unspecified(787.20)   . Dyspnea   . Dysrhythmia   . Essential hypertension   . GERD (gastroesophageal reflux disease)    Hx Schatzki's ring, multiple EGD/ED last 01/06/2004  . Gout   . Headache   . History of anemia   . Hyperlipidemia   . Internal hemorrhoids without mention of complication   . MI (myocardial infarction) ([place]) 2006  . Microscopic colitis 2003  . Panic disorder without agoraphobia   . Paresthesia   . Pneumonia 12/2011  . PVD (peripheral vascular disease) ([place])   . S/P colonoscopy 09/27/2001   internal hemorrhoids, desc colon inflam polyp, SB BX-chronic duodenitis, colitis  . Thyroid disease     Past Surgical History:  Procedure Laterality Date  . ABDOMINAL HYSTERECTOMY    . ABDOMINAL HYSTERECTOMY    . ANTERIOR AND POSTERIOR REPAIR     with resection of vagina  . ANTERIOR LAT LUMBAR FUSION N/A 08/01/2016   Procedure: Lumbar Two-Lumbar Five Transpsoas lateral interbody fusion with Lumbar Two-Three lateral plate fixation;  Surgeon: Kevan Ny Ditty, MD;  Location: [place] [facility];  Service: Neurosurgery;  Laterality: N/A;  L2-5 Transpsoas lateral interbody fusion with L2-3 lateral plate fixation  . APPENDECTOMY    . BACK SURGERY    . BIOPSY  07/05/2015  Procedure: BIOPSY;  Surgeon: Daneil Dolin, MD;  [facility];  Service: Endoscopy;;  gastric polyp biopsy, ascending colon biopsy  . BLADDER SUSPENSION  11/09/2011   Procedure: TRANSVAGINAL TAPE (TVT) PROCEDURE;  Surgeon: Marissa Nestle, MD;  Location: [facility];  Service: Urology;  Laterality: N/A;  . bladder tack  06/2010  . BREAST LUMPECTOMY  1998   left, benign  . CARDIAC CATHETERIZATION    . CARDIAC CATHETERIZATION    . CARDIAC CATHETERIZATION N/A 12/16/2015   Procedure: Left Heart Cath and Coronary Angiography;  Surgeon: Troy Sine, MD;  Location: [place] [facility];  Service:  Cardiovascular;  Laterality: N/A;  . CARDIOVERSION N/A 10/04/2017   Procedure: CARDIOVERSION;  Surgeon: Herminio Commons, MD;  Location: [facility];  Service: Cardiovascular;  Laterality: N/A;  . CARDIOVERSION N/A 01/30/2018   Procedure: CARDIOVERSION;  Surgeon: Herminio Commons, MD;  [facility];  Service: Cardiovascular;  Laterality: N/A;  . [place]   left  . cataract surgery    . CHOLECYSTECTOMY  1998  . Cholecystectomy    . COLONOSCOPY  03/16/2011   multiple hyperplastic colon polyps, sigmoid diverticulosis, melanosis coli  . COLONOSCOPY WITH PROPOFOL N/A 07/05/2015   RMR:one 5 mm polyp in descending colon  . CORONARY ANGIOGRAPHY N/A 05/16/2018   Procedure: CORONARY ANGIOGRAPHY (CATH LAB);  Surgeon: Belva Crome, MD;  Location: [place] [facility];  Service: Cardiovascular;  Laterality: N/A;  . CORONARY ANGIOPLASTY WITH STENT PLACEMENT    . ESOPHAGEAL DILATION N/A 07/05/2015   Procedure: ESOPHAGEAL DILATION;  Surgeon: Daneil Dolin, MD;  [facility];  Service: Endoscopy;  Laterality: N/A;  . ESOPHAGOGASTRODUODENOSCOPY (EGD) WITH PROPOFOL N/A 07/05/2015   UEA:[id number]  . JOINT REPLACEMENT Right 2007   right knee  . left hand surgery    . LEFT HEART CATHETERIZATION WITH CORONARY ANGIOGRAM N/A 05/14/2013   Procedure: LEFT HEART CATHETERIZATION WITH CORONARY ANGIOGRAM;  Surgeon: Blane Ohara, MD;  Location[facility];  Service: Cardiovascular;  Laterality: N/A;  . left rotator cuff surgery    . LUMBAR LAMINECTOMY/DECOMPRESSION MICRODISCECTOMY N/A 10/11/2012   Procedure: LUMBAR LAMINECTOMY/DECOMPRESSION MICRODISCECTOMY 2 LEVELS;  Surgeon: Floyce Stakes, MD;  Location: [place] [facility];  Service: Neurosurgery;  Laterality: N/A;  L3-4 L4-5 Laminectomy  . LUMBAR WOUND DEBRIDEMENT N/A 09/27/2015   Procedure: Exploration of Lumbar Wound w/ Repair CSF Leak/Lumbar Drain Placement;  Surgeon: Leeroy Cha, MD;  Location: [place] [facility];  Service:  Neurosurgery;  Laterality: N/A;  . MALONEY DILATION  03/16/2011   Gastritis. No H.pylori on bx. 63F maloney dilation with disruption of  occult cevical esophageal web  . NASAL SINUS SURGERY    . right knee replacement  2007  . right leg benign tumor    . SHOULDER SURGERY Left   . TEE WITHOUT CARDIOVERSION N/A 10/04/2017   Procedure: TRANSESOPHAGEAL ECHOCARDIOGRAM (TEE) WITH PROPOFOL;  Surgeon: Herminio Commons, MD;  Location[facility];  Service: Cardiovascular;  Laterality: N/A;  . TONSILLECTOMY    . unspecified area, hysterectomy  1972   partial     Medications Prior to Admission: Prior to Admission medications   Medication Sig Start Date End Date Taking? Authorizing Provider  acetaminophen (TYLENOL) 500 MG tablet Take 500 mg by mouth every 6 (six) hours as needed for headache.    Yes [provider]  ALLERGY RELIEF 10 MG tablet Take 10 mg by mouth daily. 06/25/19  Yes [provider]  ALPRAZolam (XANAX) 0.5 MG tablet Take 0.5 mg by mouth 2 (two) times daily. 09/29/19  Yes [provider]  apixaban (ELIQUIS) 5 MG TABS tablet Take 1 tablet (5 mg total) by mouth 2 (two) times daily. 07/14/19  Yes Evans Lance, MD  diltiazem (CARDIZEM CD) 180 MG 24 hr capsule Take 1 capsule (180 mg total) by mouth daily. 09/24/19  Yes Tat, Shanon Brow, MD  DULoxetine (CYMBALTA) 60 MG capsule Take 60 mg by mouth at bedtime.    Yes [provider]  fluticasone (CUTIVATE) 0.05 % cream Apply 1 application topically daily. 07/15/19  Yes [provider]  fluticasone (FLONASE) 50 MCG/ACT nasal spray Place 2 sprays into both nostrils 2 (two) times daily as needed for allergies.    Yes [provider]  furosemide (LASIX) 40 MG tablet Take 1 tablet (40 mg total) by mouth daily. 09/24/19  Yes Tat, Shanon Brow, MD  ipratropium-albuterol (DUONEB) 0.5-2.5 (3) MG/3ML SOLN Take 3 mLs by nebulization every 6 (six) hours. 12/18/15  Yes Regalado, Belkys A, MD  isosorbide mononitrate (IMDUR)  60 MG 24 hr tablet Take 1 tablet (60 mg total) by mouth daily. 02/21/19 05/28/20 Yes Strader, Fransisco Hertz, PA-C  magnesium oxide (MAG-OX) 400 MG tablet TAKE ONE TABLET BY MOUTH DAILY. 10/21/19  Yes Verta Ellen., NP  metoprolol succinate (TOPROL-XL) 50 MG 24 hr tablet Take 1 1/2 tabs by mouth daily 05/30/19  Yes Herminio Commons, MD  Multiple Vitamin (MULTIVITAMIN WITH MINERALS) TABS tablet Take 1 tablet by mouth daily. Centrum    Yes [provider]  nitroGLYCERIN (NITROSTAT) 0.4 MG SL tablet Place 1 tablet (0.4 mg total) under the tongue every 5 (five) minutes as needed for chest pain. Reported on 08/04/2015 11/20/17  Yes Herminio Commons, MD  pantoprazole (PROTONIX) 40 MG tablet Take 40 mg by mouth daily.    Yes [provider]  potassium chloride (KLOR-CON) 10 MEQ tablet Take 1 tablet (10 mEq total) by mouth daily. 06/17/19  Yes Herminio Commons, MD  sodium chloride (OCEAN) 0.65 % SOLN nasal spray Place 1 spray into both nostrils as needed (allergies).    Yes [provider]  traMADol (ULTRAM) 50 MG tablet Take 50 mg by mouth every 6 (six) hours as needed for pain. 10/31/19  Yes [provider]  levothyroxine (SYNTHROID, LEVOTHROID) 25 MCG tablet Take 1 tablet by mouth daily. Patient not taking: Reported on 11/08/2019 06/07/18   [provider]  methocarbamol (ROBAXIN) 500 MG tablet Take 500 mg by mouth every 6 (six) hours as needed for muscle spasms.     [provider]     Allergies:    Allergies  Allergen Reactions  . Cephalosporins Diarrhea and Nausea Only    Lightheaded  . Levaquin [Levofloxacin In D5w] Swelling  . Macrodantin [Nitrofurantoin Macrocrystal] Swelling  . Phenothiazines Anaphylaxis and Hives  . Polysorbate Anaphylaxis  . Prednisone Shortness Of Breath  . Buspirone Itching  . Cardura [Doxazosin Mesylate] Itching  . Codeine Itching  . Acyclovir And Related Itching    Redness of skin  . Colcrys [Colchicine]  Nausea Only  . Prochlorperazine Other (See Comments)    "Upset stomach"  . Ranexa [Ranolazine]     Severe drop in BP  . Atorvastatin Hives    Cramping; tolerates Crestor ok  . Ofloxacin Rash  . Other Itching and Rash    "WOOL"= make skin look like it has been burned  . Penicillins Other (See Comments)  Causes redness all over. Has patient had a PCN reaction causing immediate rash, facial/tongue/throat swelling, SOB or lightheadedness with hypotension: No Has patient had a PCN reaction causing severe rash involving mucus membranes or skin necrosis: No Has patient had a PCN reaction that required hospitalization No Has patient had a PCN reaction occurring within the last 10 years: No If all of the above answers are "NO", then may proceed with Cephalosporin use.   . Pimozide Hives and Itching    Social History:   Social History   Socioeconomic History  . Marital status: Divorced    Spouse name: Not on file  . Number of children: 5  . Years of education: Not on file  . Highest education level: Not on file  Occupational History  . Occupation: retired  Tobacco Use  . Smoking status: Former Smoker    Packs/day: 1.00    Years: 64.00    Pack years: 64.00    Types: Cigarettes    Start date: 12/24/1947    Quit date: 11/17/2001    Years since quitting: 17.9  . Smokeless tobacco: Never Used  . Tobacco comment: Quit smoking in 2003  Vaping Use  . Vaping Use: Never used  Substance and Sexual Activity  . Alcohol use: No    Alcohol/week: 0.0 standard drinks  . Drug use: No  . Sexual activity: Never  Other Topics Concern  . Not on file  Social History Narrative   Divorced.   Sister had colon perforation & died from complications in [place], [place]   Social Determinants of Health   Financial Resource Strain:   . Difficulty of Paying Living Expenses:   Food Insecurity:   . Worried About Charity fundraiser in the Last Year:   . Arboriculturist in the Last Year:   Transportation  Needs:   . Film/video editor (Medical):   [name] Kitchen Lack of Transportation (Non-Medical):   Physical Activity:   . Days of Exercise per Week:   . Minutes of Exercise per Session:   Stress:   . Feeling of Stress :   Social Connections:   . Frequency of Communication with Friends and Family:   . Frequency of Social Gatherings with Friends and Family:   . Attends Religious Services:   . Active Member of Clubs or Organizations:   . Attends Archivist Meetings:   [name] Kitchen Marital Status:   Intimate Partner Violence:   . Fear of Current or Ex-Partner:   . Emotionally Abused:   [name] Kitchen Physically Abused:   . Sexually Abused:      Family History:   The patient's family history includes Cancer in an other family member; Coronary artery disease in an other family member; Diabetes in her brother and son; Heart disease in her son; Hypertension in an other family member; Parkinson's disease in her father; Stroke in her mother and another family member; Stroke (age of onset: 51) in her daughter. There is no history of Colon cancer.    ROS:  Please see the history of present illness.  All other ROS reviewed and negative.     Physical Exam/Data:   Vitals:   11/09/19 0024 11/09/19 0027 11/09/19 0033 11/09/19 0115  BP: (!) 136/73   (!) 150/70  Pulse: 77   55  Resp:  19  20  Temp:   97.9 F (36.6 C) 97.8 F (36.6 C)  TempSrc:   Oral Oral  SpO2: 100%   98%  Weight:  73.3 kg  Height:    5\' 1"  (1.549 m)   No intake or output data in the 24 hours ending 11/09/19 0148 Last 3 Weights 11/09/2019 11/08/2019 10/06/2019  Weight (lbs) 161 lb 8 oz 162 lb 3.2 oz 167 lb 3.2 oz  Weight (kg) 73.256 kg 73.573 kg 75.841 kg     Body mass index is 30.52 kg/m.  Wt Readings from Last 3 Encounters:  11/09/19 73.3 kg  10/06/19 75.8 kg  09/23/19 75.1 kg    Physical Exam: General: Well developed, well nourished, in no acute distress. Head: Normocephalic, atraumatic, sclera non-icteric, no xanthomas, nares  are without discharge.  Neck: Negative for carotid bruits. JVD not elevated. Lungs: Clear bilaterally to auscultation without wheezes, rales, or rhonchi. Breathing is unlabored. Heart: RRR with S1 S2. No murmurs, rubs, or gallops appreciated. Abdomen: Soft, non-tender, non-distended with normoactive bowel sounds. No hepatomegaly. No rebound/guarding. No obvious abdominal masses. Msk:  Strength and tone appear normal for age. Extremities: No clubbing or cyanosis. No edema.  Distal pedal pulses are 2+ and equal bilaterally. Neuro: Alert and oriented X 3. No focal deficit. No facial asymmetry. Moves all extremities spontaneously. Psych:  Responds to questions appropriately with a normal affect.    EKG:  The ECG that was done demonstrates NSR with no ischemia  Relevant CV Studies: See HPI  Laboratory Data:  High Sensitivity Troponin:   Recent Labs  Lab 11/08/19 1953 11/08/19 2110  TROPONINIHS 7 6      Cardiac EnzymesNo results for input(s): TROPONINI in the last 168 hours. No results for input(s): TROPIPOC in the last 168 hours.  Chemistry Recent Labs  Lab 11/08/19 1953  NA 136  K 3.7  CL 100  CO2 24  GLUCOSE 108*  BUN 21  CREATININE 1.31*  CALCIUM 9.2  GFRNONAA 37*  GFRAA 43*  ANIONGAP 12    No results for input(s): PROT, ALBUMIN, AST, ALT, ALKPHOS, BILITOT in the last 168 hours. Hematology Recent Labs  Lab 11/08/19 1953  WBC 9.2  RBC 3.73*  HGB 9.2*  HCT 30.7*  MCV 82.3  MCH 24.7*  MCHC 30.0  RDW 14.7  PLT 441*   BNPNo results for input(s): BNP, PROBNP in the last 168 hours.  DDimer No results for input(s): DDIMER in the last 168 hours.   Radiology/Studies:  DG Chest Portable 1 View  Result Date: 11/08/2019 CLINICAL DATA:  Chest pain, short of breath, atrial fibrillation, previous tobacco abuse EXAM: PORTABLE CHEST 1 VIEW COMPARISON:  09/19/2019 FINDINGS: Single frontal view of the chest demonstrates a stable cardiac silhouette. There is chronic  interstitial prominence, consistent with history of tobacco abuse. No acute airspace disease, effusion, or pneumothorax. No acute bony abnormalities. IMPRESSION: 1. Chronic interstitial lung disease.  No acute process. Electronically Signed   By: Randa Ngo M.D.   On: 11/08/2019 19:11    Assessment and Plan   1. Chest pain Worsening chest pain with normal troponin and ECG.  Plan stress test today. --N.p.o. for stress test  2.  Atrial fibrillation Continue home apixaban and diltiazem  3.  HFpEF Continue home Lasix 40 mg daily.  Severity of Illness: The appropriate patient status for this patient is OBSERVATION. Observation status is judged to be reasonable and necessary in order to provide the required intensity of service to ensure the patient's safety. The patient's presenting symptoms, physical exam findings, and initial radiographic and laboratory data in the context of their medical condition is felt to place them at decreased  risk for further clinical deterioration. Furthermore, it is anticipated that the patient will be medically stable for discharge from the hospital within 2 midnights of admission. The following factors support the patient status of observation.   " The patient's presenting symptoms include chest pain. " The physical exam findings include none. " The initial radiographic and laboratory data are normal.     For questions or updates, please contact [place] Please consult [email or web address] for contact info under      Signed, Dynasia Kercheval S, MD 11/09/2019, 1:48 AM

## 2019-11-09 NOTE — Progress Notes (Signed)
On admission patient noted to have two pressure ulcers to buttocks. One 4x4cm and the other 3x2cm. Foam dressing applied.

## 2019-11-09 NOTE — Progress Notes (Addendum)
   [name] is more comfortable today, still has chest pain 2nd tenderness at the lower R sternal border.  [name] presented for a nuclear stress test today.  No immediate complications.  Stress imaging is pending at this time.  Preliminary EKG findings may be listed in the chart, but the stress test result will not be finalized until perfusion imaging is complete.  1 day study, [facility] to read.  Rosaria Ferries, PA-C 11/09/2019, 11:47 AM

## 2019-11-10 ENCOUNTER — Other Ambulatory Visit: Payer: Self-pay

## 2019-11-10 ENCOUNTER — Observation Stay (HOSPITAL_COMMUNITY): Payer: Medicare HMO

## 2019-11-10 ENCOUNTER — Observation Stay (HOSPITAL_COMMUNITY): Payer: Medicare HMO | Admitting: Anesthesiology

## 2019-11-10 ENCOUNTER — Encounter (HOSPITAL_COMMUNITY): Payer: Self-pay | Admitting: Internal Medicine

## 2019-11-10 ENCOUNTER — Encounter (HOSPITAL_COMMUNITY)
Admission: EM | Disposition: A | Discharge status: Home / Self Care | Payer: Self-pay | Source: Home / Self Care | Attending: Internal Medicine

## 2019-11-10 DIAGNOSIS — Z90710 Acquired absence of both cervix and uterus: Secondary | ICD-10-CM | POA: Diagnosis not present

## 2019-11-10 DIAGNOSIS — M109 Gout, unspecified: Secondary | ICD-10-CM | POA: Diagnosis present

## 2019-11-10 DIAGNOSIS — I2510 Atherosclerotic heart disease of native coronary artery without angina pectoris: Secondary | ICD-10-CM

## 2019-11-10 DIAGNOSIS — F329 Major depressive disorder, single episode, unspecified: Secondary | ICD-10-CM | POA: Diagnosis present

## 2019-11-10 DIAGNOSIS — Z9181 History of falling: Secondary | ICD-10-CM | POA: Diagnosis not present

## 2019-11-10 DIAGNOSIS — I4891 Unspecified atrial fibrillation: Secondary | ICD-10-CM

## 2019-11-10 DIAGNOSIS — I4819 Other persistent atrial fibrillation: Secondary | ICD-10-CM | POA: Diagnosis not present

## 2019-11-10 DIAGNOSIS — E876 Hypokalemia: Secondary | ICD-10-CM | POA: Diagnosis not present

## 2019-11-10 DIAGNOSIS — I472 Ventricular tachycardia: Secondary | ICD-10-CM | POA: Diagnosis not present

## 2019-11-10 DIAGNOSIS — D649 Anemia, unspecified: Secondary | ICD-10-CM | POA: Diagnosis present

## 2019-11-10 DIAGNOSIS — N183 Chronic kidney disease, stage 3 unspecified: Secondary | ICD-10-CM | POA: Diagnosis not present

## 2019-11-10 DIAGNOSIS — E785 Hyperlipidemia, unspecified: Secondary | ICD-10-CM | POA: Diagnosis present

## 2019-11-10 DIAGNOSIS — I5043 Acute on chronic combined systolic (congestive) and diastolic (congestive) heart failure: Secondary | ICD-10-CM | POA: Diagnosis not present

## 2019-11-10 DIAGNOSIS — Z955 Presence of coronary angioplasty implant and graft: Secondary | ICD-10-CM | POA: Diagnosis not present

## 2019-11-10 DIAGNOSIS — F419 Anxiety disorder, unspecified: Secondary | ICD-10-CM | POA: Diagnosis present

## 2019-11-10 DIAGNOSIS — I25110 Atherosclerotic heart disease of native coronary artery with unstable angina pectoris: Secondary | ICD-10-CM | POA: Diagnosis not present

## 2019-11-10 DIAGNOSIS — I5032 Chronic diastolic (congestive) heart failure: Secondary | ICD-10-CM | POA: Diagnosis not present

## 2019-11-10 DIAGNOSIS — I739 Peripheral vascular disease, unspecified: Secondary | ICD-10-CM | POA: Diagnosis present

## 2019-11-10 DIAGNOSIS — K219 Gastro-esophageal reflux disease without esophagitis: Secondary | ICD-10-CM | POA: Diagnosis present

## 2019-11-10 DIAGNOSIS — R079 Chest pain, unspecified: Secondary | ICD-10-CM | POA: Diagnosis present

## 2019-11-10 DIAGNOSIS — I480 Paroxysmal atrial fibrillation: Secondary | ICD-10-CM | POA: Diagnosis not present

## 2019-11-10 DIAGNOSIS — F410 Panic disorder [episodic paroxysmal anxiety] without agoraphobia: Secondary | ICD-10-CM | POA: Diagnosis present

## 2019-11-10 DIAGNOSIS — L89322 Pressure ulcer of left buttock, stage 2: Secondary | ICD-10-CM | POA: Diagnosis not present

## 2019-11-10 DIAGNOSIS — I130 Hypertensive heart and chronic kidney disease with heart failure and stage 1 through stage 4 chronic kidney disease, or unspecified chronic kidney disease: Secondary | ICD-10-CM | POA: Diagnosis not present

## 2019-11-10 DIAGNOSIS — I350 Nonrheumatic aortic (valve) stenosis: Secondary | ICD-10-CM | POA: Diagnosis not present

## 2019-11-10 DIAGNOSIS — I252 Old myocardial infarction: Secondary | ICD-10-CM | POA: Diagnosis not present

## 2019-11-10 DIAGNOSIS — J449 Chronic obstructive pulmonary disease, unspecified: Secondary | ICD-10-CM | POA: Diagnosis present

## 2019-11-10 DIAGNOSIS — M1990 Unspecified osteoarthritis, unspecified site: Secondary | ICD-10-CM | POA: Diagnosis present

## 2019-11-10 DIAGNOSIS — Z87891 Personal history of nicotine dependence: Secondary | ICD-10-CM | POA: Diagnosis not present

## 2019-11-10 DIAGNOSIS — Z20822 Contact with and (suspected) exposure to covid-19: Secondary | ICD-10-CM | POA: Diagnosis not present

## 2019-11-10 HISTORY — PX: TEE WITHOUT CARDIOVERSION: SHX5443

## 2019-11-10 HISTORY — PX: CARDIOVERSION: SHX1299

## 2019-11-10 LAB — BASIC METABOLIC PANEL
Anion gap: 12 (ref 5–15)
BUN: 18 mg/dL (ref 8–23)
CO2: 25 mmol/L (ref 22–32)
Calcium: 8.8 mg/dL — ABNORMAL LOW (ref 8.9–10.3)
Chloride: 99 mmol/L (ref 98–111)
Creatinine, Ser: 1.78 mg/dL — ABNORMAL HIGH (ref 0.44–1.00)
GFR calc Af Amer: 30 mL/min — ABNORMAL LOW (ref >60)
GFR calc non Af Amer: 26 mL/min — ABNORMAL LOW (ref >60)
Glucose, Bld: 118 mg/dL — ABNORMAL HIGH (ref 70–99)
Potassium: 3.2 mmol/L — ABNORMAL LOW (ref 3.5–5.1)
Sodium: 136 mmol/L (ref 135–145)

## 2019-11-10 LAB — LIPID PANEL
Cholesterol: 152 mg/dL (ref 0–200)
HDL: 38 mg/dL — ABNORMAL LOW (ref >40)
LDL Cholesterol: 98 mg/dL (ref 0–99)
Total CHOL/HDL Ratio: 4 RATIO
Triglycerides: 82 mg/dL (ref <150)
VLDL: 16 mg/dL (ref 0–40)

## 2019-11-10 LAB — GLUCOSE, CAPILLARY
Glucose-Capillary: 109 mg/dL — ABNORMAL HIGH (ref 70–99)
Glucose-Capillary: 117 mg/dL — ABNORMAL HIGH (ref 70–99)
Glucose-Capillary: 136 mg/dL — ABNORMAL HIGH (ref 70–99)
Glucose-Capillary: 111 mg/dL — ABNORMAL HIGH (ref 70–99)

## 2019-11-10 LAB — ECHO TEE
AV Mean grad: 5 mmHg
AV Peak grad: 9.6 mmHg
Ao pk vel: 1.55 m/s

## 2019-11-10 LAB — PROTIME-INR
INR: 1.6 — ABNORMAL HIGH (ref 0.8–1.2)
Prothrombin Time: 18.6 seconds — ABNORMAL HIGH (ref 11.4–15.2)

## 2019-11-10 SURGERY — ECHOCARDIOGRAM, TRANSESOPHAGEAL
Anesthesia: General

## 2019-11-10 MED ORDER — PROPOFOL 500 MG/50ML IV EMUL
INTRAVENOUS | Status: DC | PRN
  Administered 2019-11-10: 50 ug/kg/min via INTRAVENOUS

## 2019-11-10 MED ORDER — LACTATED RINGERS IV SOLN
INTRAVENOUS | Status: DC
  Administered 2019-11-10: 1000 mL via INTRAVENOUS

## 2019-11-10 MED ORDER — PHENYLEPHRINE HCL (PRESSORS) 10 MG/ML IV SOLN
INTRAVENOUS | Status: DC | PRN
  Administered 2019-11-10 (×3): 80 ug via INTRAVENOUS

## 2019-11-10 MED ORDER — POTASSIUM CHLORIDE CRYS ER 20 MEQ PO TBCR
40.0000 meq | EXTENDED_RELEASE_TABLET | Freq: Once | ORAL | Status: AC
  Administered 2019-11-10: 40 meq via ORAL
  Filled 2019-11-10: qty 2

## 2019-11-10 MED ORDER — LIDOCAINE HCL (CARDIAC) PF 100 MG/5ML IV SOSY
PREFILLED_SYRINGE | INTRAVENOUS | Status: DC | PRN
  Administered 2019-11-10: 20 mg via INTRAVENOUS

## 2019-11-10 NOTE — Progress Notes (Signed)
Progress Note  Patient Name: [name] Date of Encounter: 11/10/2019  [facility] Cardiologist: Kate Sable, MD (Inactive)   Subjective   Patient is an 86 year old female who was admitted with chest pain.  She has a history of atrial fibrillation.  She had TEE cardioversion this morning. Exercise  Myoview study from yesterday reveals no evidence of ischemia.   Inpatient Medications    Scheduled Meds: . apixaban  5 mg Oral BID  . diltiazem  180 mg Oral Daily  . DULoxetine  60 mg Oral QHS  . furosemide  40 mg Oral Daily  . isosorbide mononitrate  60 mg Oral Daily  . metoprolol succinate  75 mg Oral Daily  . pantoprazole  40 mg Oral Daily  . sodium chloride flush  3 mL Intravenous Once   Continuous Infusions:  PRN Meds: acetaminophen, ALPRAZolam, nitroGLYCERIN, ondansetron (ZOFRAN) IV   Vital Signs    Vitals:   11/10/19 1328 11/10/19 1332 11/10/19 1338 11/10/19 1348  BP: (!) 86/32 (!) 127/45 (!) 117/46 (!) 124/45  Pulse: 52 55 57 58  Resp: 20 (!) 24 16 22   Temp: 97.6 F (36.4 C)     TempSrc: Temporal     SpO2: 100% 100% 98% 98%  Weight:      Height:        Intake/Output Summary (Last 24 hours) at 11/10/2019 1500 Last data filed at 11/10/2019 1311 Gross per 24 hour  Intake 590 ml  Output 1200 ml  Net -610 ml   Last 3 Weights 11/10/2019 11/09/2019 11/08/2019  Weight (lbs) 161 lb 2.5 oz 161 lb 8 oz 162 lb 3.2 oz  Weight (kg) 73.1 kg 73.256 kg 73.573 kg      Telemetry    Sinus rhythm - Personally Reviewed  ECG     - Personally Reviewed  Physical Exam   GEN: elderly female,   NAD , fatigued from the events of last night and today .   Neck: No JVD Cardiac: RRR, no murmurs, rubs, or gallops.  Respiratory: Clear to auscultation bilaterally. GI: Soft, nontender, non-distended  MS: No edema; No deformity. Neuro:  Nonfocal  Psych: Normal affect   Labs    High Sensitivity Troponin:   Recent Labs  Lab 11/08/19 1953 11/08/19 2110    TROPONINIHS 7 6      Chemistry Recent Labs  Lab 11/08/19 1953 11/10/19 0502  NA 136 136  K 3.7 3.2*  CL 100 99  CO2 24 25  GLUCOSE 108* 118*  BUN 21 18  CREATININE 1.31* 1.78*  CALCIUM 9.2 8.8*  GFRNONAA 37* 26*  GFRAA 43* 30*  ANIONGAP 12 12     Hematology Recent Labs  Lab 11/08/19 1953 11/09/19 1825  WBC 9.2  --   RBC 3.73* 3.86*  HGB 9.2*  --   HCT 30.7*  --   MCV 82.3  --   MCH 24.7*  --   MCHC 30.0  --   RDW 14.7  --   PLT 441*  --     BNP Recent Labs  Lab 11/09/19 1825  BNP 365.4*     DDimer No results for input(s): DDIMER in the last 168 hours.   Radiology    NM Myocar Multi W/Spect W/Wall Motion / EF  Result Date: 11/09/2019  No T wave inversion was noted during stress.  This is a low risk study.  No ischemia or infarction perfusion images. No ischemic ECG changes. Study is low risk.   DG Chest  Portable 1 View  Result Date: 11/08/2019 CLINICAL DATA:  Chest pain, short of breath, atrial fibrillation, previous tobacco abuse EXAM: PORTABLE CHEST 1 VIEW COMPARISON:  09/19/2019 FINDINGS: Single frontal view of the chest demonstrates a stable cardiac silhouette. There is chronic interstitial prominence, consistent with history of tobacco abuse. No acute airspace disease, effusion, or pneumothorax. No acute bony abnormalities. IMPRESSION: 1. Chronic interstitial lung disease.  No acute process. Electronically Signed   By: Randa Ngo M.D.   On: 11/08/2019 19:11   ECHO TEE  Result Date: 11/10/2019    TRANSESOPHOGEAL ECHO REPORT   Patient Name:   [name] Date of Exam: 11/10/2019 Medical Rec #:  [id number]      Height:       61.0 in Accession #:    [id number]     Weight:       161.2 lb Date of Birth:  Apr 05, 1933       BSA:          1.723 m Patient Age:    35 years       BP:           145/66 mmHg Patient Gender: F              HR:           56 bpm. Exam Location:  Inpatient Procedure: Limited Echo, Cardiac Doppler and Color Doppler Indications:     Atrial Fibrillation  History:        Patient has prior history of Echocardiogram examinations, most                 recent 09/20/2019. CHF, CAD and Previous Myocardial Infarction,                 COPD, Arrythmias:Atrial Fibrillation, Signs/Symptoms:Dyspnea and                 Chest Pain; Risk Factors:Dyslipidemia, Hypertension and Former                 Smoker. GERD. DOE.  Sonographer:    Vickie Epley RDCS Referring Phys: [id number] [place]: The transesophogeal probe was passed without difficulty through the esophogus of the patient. Local oropharyngeal anesthetic was provided with Cetacaine. Sedation performed by different physician. The patient was monitored while under deep sedation. Anesthestetic sedation was provided intravenously by Anesthesiology: 78.58mg  of Propofol, 20mg  of Lidocaine. The patient's vital signs; including heart rate, blood pressure, and oxygen saturation; remained stable throughout the procedure. The patient developed no complications during the procedure. IMPRESSIONS  1. No LAA thrombus DCC x 2 performed with 150J then 200J converted to NSR.  2. Left ventricular ejection fraction, by estimation, is 60 to 65%. The left ventricle has normal function. The left ventricle has no regional wall motion abnormalities.  3. Right ventricular systolic function is normal. The right ventricular size is normal.  4. Left atrial size was moderately dilated. No left atrial/left atrial appendage thrombus was detected.  5. The mitral valve is normal in structure. Trivial mitral valve regurgitation. No evidence of mitral stenosis.  6. The aortic valve is tricuspid. Aortic valve regurgitation is mild. Mild aortic valve stenosis.  7. The inferior vena cava is normal in size with greater than 50% respiratory variability, suggesting right atrial pressure of 3 mmHg. Conclusion(s)/Recommendation(s): Normal biventricular function without evidence of hemodynamically significant valvular heart disease.  FINDINGS  Left Ventricle: Left ventricular ejection fraction, by estimation, is 60 to 65%. The left ventricle has  normal function. The left ventricle has no regional wall motion abnormalities. The left ventricular internal cavity size was normal in size. There is  no left ventricular hypertrophy. Right Ventricle: The right ventricular size is normal. No increase in right ventricular wall thickness. Right ventricular systolic function is normal. Left Atrium: Spontaneoius contrast but. Left atrial size was moderately dilated. No left atrial/left atrial appendage thrombus was detected. Right Atrium: Right atrial size was normal in size. Pericardium: There is no evidence of pericardial effusion. Mitral Valve: The mitral valve is normal in structure. Normal mobility of the mitral valve leaflets. Trivial mitral valve regurgitation. No evidence of mitral valve stenosis. Tricuspid Valve: The tricuspid valve is normal in structure. Tricuspid valve regurgitation is mild . No evidence of tricuspid stenosis. Aortic Valve: The aortic valve is tricuspid. Aortic valve regurgitation is mild. Mild aortic stenosis is present. Aortic valve mean gradient measures 5.0 mmHg. Aortic valve peak gradient measures 9.6 mmHg. Pulmonic Valve: The pulmonic valve was normal in structure. Pulmonic valve regurgitation is mild. No evidence of pulmonic stenosis. Aorta: The aortic root is normal in size and structure. Venous: The inferior vena cava is normal in size with greater than 50% respiratory variability, suggesting right atrial pressure of 3 mmHg. IAS/Shunts: The interatrial septum appears to be lipomatous. No atrial level shunt detected by color flow Doppler. Additional Comments: No LAA thrombus DCC x 2 performed with 150J then 200J converted to NSR.  AORTIC VALVE AV Vmax:      155.00 cm/s AV Vmean:     107.000 cm/s AV VTI:       0.316 m AV Peak Grad: 9.6 mmHg AV Mean Grad: 5.0 mmHg Jenkins Rouge MD Electronically signed by Jenkins Rouge MD  Signature Date/Time: 11/10/2019/2:29:48 PM    Final     Cardiac Studies      Patient Profile     86 y.o. female with cp and atrial fib   Assessment & Plan    Chest discomfort: She had a stress Myoview study yesterday which was low risk.  She has normal left ventricular function. 2.  Atrial fibrillation: She was cardioverted earlier today.  She has been cardioverted 3 times so far.  Will defer to Dr. Meda Coffee as to whether or not she wants her to go to A. fib clinic or one of the electrophysiologist. She is somewhat fatigued today.  We will let her rest and have her ambulate today and tomorrow.  Anticipate discharge tomorrow morning.   For questions or updates, please contact [place] Please consult [email or web address] for contact info under        Signed, Mertie Moores, MD  11/10/2019, 3:00 PM

## 2019-11-10 NOTE — CV Procedure (Signed)
TEE:  Normal EF Mild AR/MR Normal RV No LAA thrombus smoke  No ASD  DCC x 2 150->200J converted from afib rate 105 to NSR rate 55 bpm  No immediate neurologic sequelae   Jenkins Rouge MD [facility]

## 2019-11-10 NOTE — Anesthesia Preprocedure Evaluation (Addendum)
Anesthesia Evaluation  Patient identified by MRN, date of birth, ID band Patient awake    Reviewed: Allergy & Precautions, H&P , NPO status , Patient's Chart, lab work & pertinent test results, reviewed documented beta blocker date and time   Airway Mallampati: II  TM Distance: >3 FB Neck ROM: Full    Dental no notable dental hx. (+) Edentulous Upper, Edentulous Lower, Dental Advisory Given   Pulmonary COPD, former smoker,    Pulmonary exam normal breath sounds clear to auscultation       Cardiovascular hypertension, Pt. on medications and Pt. on home beta blockers + CAD, + Past MI, + Cardiac Stents, + Peripheral Vascular Disease and +CHF  + dysrhythmias Atrial Fibrillation  Rhythm:Irregular Rate:Normal     Neuro/Psych  Headaches, Anxiety Depression    GI/Hepatic Neg liver ROS, GERD  Medicated,  Endo/Other  Hypothyroidism   Renal/GU negative Renal ROS  negative genitourinary   Musculoskeletal  (+) Arthritis ,   Abdominal   Peds  Hematology  (+) Blood dyscrasia, anemia ,   Anesthesia Other Findings   Reproductive/Obstetrics negative OB ROS                            Anesthesia Physical Anesthesia Plan  ASA: III  Anesthesia Plan: General   Post-op Pain Management:    Induction: Intravenous  PONV Risk Score and Plan: 3 and Propofol infusion and Treatment may vary due to age or medical condition  Airway Management Planned: Nasal Cannula  Additional Equipment:   Intra-op Plan:   Post-operative Plan:   Informed Consent: I have reviewed the patients History and Physical, chart, labs and discussed the procedure including the risks, benefits and alternatives for the proposed anesthesia with the patient or authorized representative who has indicated his/her understanding and acceptance.     Dental advisory given  Plan Discussed with: CRNA  Anesthesia Plan Comments:          Anesthesia Quick Evaluation

## 2019-11-10 NOTE — Progress Notes (Addendum)
Progress Note  Patient Name: [name] Date of Encounter: 11/10/2019  Primary Cardiologist: Dr. Kate Sable, MD (Inactive)  Subjective   Remains SOB. Plan for TEE/DCCV today at 1245  Inpatient Medications    Scheduled Meds: . apixaban  5 mg Oral BID  . diltiazem  180 mg Oral Daily  . DULoxetine  60 mg Oral QHS  . furosemide  40 mg Oral Daily  . isosorbide mononitrate  60 mg Oral Daily  . metoprolol succinate  75 mg Oral Daily  . pantoprazole  40 mg Oral Daily  . sodium chloride flush  3 mL Intravenous Once   Continuous Infusions:  PRN Meds: acetaminophen, ALPRAZolam, nitroGLYCERIN, ondansetron (ZOFRAN) IV   Vital Signs    Vitals:   11/10/19 0017 11/10/19 0402 11/10/19 0500 11/10/19 0734  BP: (!) 120/54 111/65  106/66  Pulse: 67 70  67  Resp: 20 20  16   Temp: 98.2 F (36.8 C) 98.2 F (36.8 C)  98 F (36.7 C)  TempSrc: Oral Oral  Oral  SpO2: 93% 97%  97%  Weight:   73.1 kg   Height:        Intake/Output Summary (Last 24 hours) at 11/10/2019 0955 Last data filed at 11/09/2019 2100 Gross per 24 hour  Intake 240 ml  Output 800 ml  Net -560 ml   Filed Weights   11/08/19 1832 11/09/19 0115 11/10/19 0500  Weight: 73.6 kg 73.3 kg 73.1 kg    Physical Exam   General: Well developed, well nourished, NAD Neck: Negative for carotid bruits. No JVD Lungs:Clear to ausculation bilaterally. No wheezes, rales, or rhonchi. Breathing is unlabored. Cardiovascular: Irregularly irregular with S1 S2. No murmurs Abdomen: Soft, non-tender, non-distended. No obvious abdominal masses. Extremities: No edema. Radial pulses 2+ bilaterally Neuro: Alert and oriented. No focal deficits. No facial asymmetry. MAE spontaneously. Psych: Responds to questions appropriately with normal affect.    Labs    Chemistry Recent Labs  Lab 11/08/19 1953 11/10/19 0502  NA 136 136  K 3.7 3.2*  CL 100 99  CO2 24 25  GLUCOSE 108* 118*  BUN 21 18  CREATININE 1.31* 1.78*    CALCIUM 9.2 8.8*  GFRNONAA 37* 26*  GFRAA 43* 30*  ANIONGAP 12 12     Hematology Recent Labs  Lab 11/08/19 1953 11/09/19 1825  WBC 9.2  --   RBC 3.73* 3.86*  HGB 9.2*  --   HCT 30.7*  --   MCV 82.3  --   MCH 24.7*  --   MCHC 30.0  --   RDW 14.7  --   PLT 441*  --     Cardiac EnzymesNo results for input(s): TROPONINI in the last 168 hours. No results for input(s): TROPIPOC in the last 168 hours.   BNP Recent Labs  Lab 11/09/19 1825  BNP 365.4*     DDimer No results for input(s): DDIMER in the last 168 hours.   Radiology    NM Myocar Multi W/Spect W/Wall Motion / EF  Result Date: 11/09/2019  No T wave inversion was noted during stress.  This is a low risk study.  No ischemia or infarction perfusion images. No ischemic ECG changes. Study is low risk.   DG Chest Portable 1 View  Result Date: 11/08/2019 CLINICAL DATA:  Chest pain, short of breath, atrial fibrillation, previous tobacco abuse EXAM: PORTABLE CHEST 1 VIEW COMPARISON:  09/19/2019 FINDINGS: Single frontal view of the chest demonstrates a stable cardiac silhouette. There  is chronic interstitial prominence, consistent with history of tobacco abuse. No acute airspace disease, effusion, or pneumothorax. No acute bony abnormalities. IMPRESSION: 1. Chronic interstitial lung disease.  No acute process. Electronically Signed   By: Randa Ngo M.D.   On: 11/08/2019 19:11   Telemetry    11/10/19 AF - Personally Reviewed  ECG    No new tracing as of 11/10/19 - Personally Reviewed  Cardiac Studies   Stress test 11/09/19:   No T wave inversion was noted during stress.  This is a low risk study.   No ischemia or infarction perfusion images. No ischemic ECG changes. Study is low risk.    Echo 09/20/2019:  1. Left ventricular ejection fraction, by estimation, is 60 to 65%. The  left ventricle has normal function. The left ventricle has no regional  wall motion abnormalities. There is mild left ventricular  hypertrophy.  Left ventricular diastolic parameters  are indeterminate.  2. Right ventricular systolic function is normal. The right ventricular  size is normal. There is normal pulmonary artery systolic pressure.  3. The mitral valve is normal in structure. Trivial mitral valve  regurgitation. No evidence of mitral stenosis.  4. The aortic valve was not well visualized. Aortic valve regurgitation  is mild. No aortic stenosis is present.   Patient Profile     86 y.o. female with persistent AF, NSVT, HTN, CAD s/p PCI to LCX in 2015, HFpEF who presents with chest pain, diaphoresis.   Assessment & Plan    1. Chest pain: -Pt presented with progressive SOB with acute change in symptoms>>>also had associated chest pain which seems to be musculoskeletal from coughing>>reproducible on exam with negative stress as below  -Last cath 2020 with no obstructive disease (20% LAD, 30% LCx, nondominant RCA with 90% ostial lesion) -Plan was to undergo stress testing>>perfomed 11/09/19 which showed no ischemia or infarct>>low risk study    2.  Persistent atrial fibrillation: -Given probable missed Eliquis dosing>>plan per chart review was for TEE/DCCV  -Respiratory symptoms felt to improve with restoration of NSR  -BNP elevated at admission  -Given IV Lasix 40mg  x1 then restarted home PO dosing. Would hold in the setting of rising creatinine  -Continue home apixaban and diltiazem  3. HFpEF: -Echocardiogram from 09/20/2019 with stable EF  -Continue home Lasix 40 mg daily  4. Acute on chronic kidney disease Stage III: -Creatinine 1.31 yesterday with jump to 1.78 today  -Hold diuretic and follow renal function closely   5. Hypokalemia: -K+, 3.2>>>will replace   Signed, Kathyrn Drown NP-C [place] Pager: [phone] 11/10/2019, 9:55 AM     For questions or updates, please contact   Please consult [email or web address] for contact info under Cardiology/STEMI.   Attending Note:   The patient  was seen and examined.  Agree with assessment and plan as noted above.  Changes made to the above note as needed.  Patient seen and independently examined with Kathyrn Drown, NP.   We discussed all aspects of the encounter. I agree with the assessment and plan as stated above.  Chest discomfort: She had a stress Myoview study yesterday which was low risk.  She has normal left ventricular function. 2.  Atrial fibrillation: She was cardioverted earlier today.  She has been cardioverted 3 times so far.  Will defer to Dr. Meda Coffee as to whether or not she wants her to go to A. fib clinic or one of the electrophysiologist. She is somewhat fatigued today.  We will let her rest and have her  ambulate today and tomorrow.  Anticipate discharge tomorrow morning   I have spent a total of 40 minutes with patient reviewing hospital  notes , telemetry, EKGs, labs and examining patient as well as establishing an assessment and plan that was discussed with the patient. > 50% of time was spent in direct patient care.    Thayer Headings, Brooke Bonito., MD, [facility] 11/11/2019, 2:45 PM [address],  [place] Pager [phone]

## 2019-11-10 NOTE — Interval H&P Note (Signed)
History and Physical Interval Note:  11/10/2019 12:15 PM  [name]  has presented today for surgery, with the diagnosis of afib.  The various methods of treatment have been discussed with the patient and family. After consideration of risks, benefits and other options for treatment, the patient has consented to  Procedure(s): TRANSESOPHAGEAL ECHOCARDIOGRAM (TEE) (N/A) CARDIOVERSION (N/A) as a surgical intervention.  The patient's history has been reviewed, patient examined, no change in status, stable for surgery.  I have reviewed the patient's chart and labs.  Questions were answered to the patient's satisfaction.     Jenkins Rouge

## 2019-11-10 NOTE — Progress Notes (Signed)
  Echocardiogram Echocardiogram Transesophageal has been performed.  Geoffery Lyons [place] 11/10/2019, 1:24 PM

## 2019-11-10 NOTE — Transfer of Care (Signed)
Immediate Anesthesia Transfer of Care Note  Patient: [name]  Procedure(s) Performed: TRANSESOPHAGEAL ECHOCARDIOGRAM (TEE) (N/A ) CARDIOVERSION (N/A )  Patient Location: Endoscopy Unit  Anesthesia Type:General  Level of Consciousness: sedated  Airway & Oxygen Therapy: Patient connected to nasal cannula oxygen  Post-op Assessment: Post -op Vital signs reviewed and stable  Post vital signs: stable  Last Vitals:  Vitals Value Taken Time  BP    Temp    Pulse    Resp    SpO2      Last Pain:  Vitals:   11/10/19 1209  TempSrc: Oral  PainSc: 0-No pain         Complications: No complications documented.

## 2019-11-10 NOTE — Anesthesia Postprocedure Evaluation (Signed)
Anesthesia Post Note  Patient: [name]  Procedure(s) Performed: TRANSESOPHAGEAL ECHOCARDIOGRAM (TEE) (N/A ) CARDIOVERSION (N/A )     Patient location during evaluation: Endoscopy Anesthesia Type: General Level of consciousness: awake and alert Pain management: pain level controlled Vital Signs Assessment: post-procedure vital signs reviewed and stable Respiratory status: spontaneous breathing, nonlabored ventilation and respiratory function stable Cardiovascular status: blood pressure returned to baseline and stable Postop Assessment: no apparent nausea or vomiting Anesthetic complications: no   No complications documented.  Last Vitals:  Vitals:   11/10/19 1338 11/10/19 1348  BP: (!) 117/46 (!) 124/45  Pulse: 57 58  Resp: 16 22  Temp:    SpO2: 98% 98%    Last Pain:  Vitals:   11/10/19 1348  TempSrc:   PainSc: 0-No pain                 Brunella Wileman,W. EDMOND

## 2019-11-11 ENCOUNTER — Encounter (HOSPITAL_COMMUNITY): Payer: Self-pay | Admitting: Cardiovascular Disease

## 2019-11-11 DIAGNOSIS — I2510 Atherosclerotic heart disease of native coronary artery without angina pectoris: Secondary | ICD-10-CM | POA: Diagnosis not present

## 2019-11-11 DIAGNOSIS — I4819 Other persistent atrial fibrillation: Secondary | ICD-10-CM | POA: Diagnosis not present

## 2019-11-11 LAB — GLUCOSE, CAPILLARY
Glucose-Capillary: 100 mg/dL — ABNORMAL HIGH (ref 70–99)
Glucose-Capillary: 88 mg/dL (ref 70–99)

## 2019-11-11 LAB — BASIC METABOLIC PANEL
Anion gap: 9 (ref 5–15)
BUN: 21 mg/dL (ref 8–23)
CO2: 25 mmol/L (ref 22–32)
Calcium: 8.7 mg/dL — ABNORMAL LOW (ref 8.9–10.3)
Chloride: 101 mmol/L (ref 98–111)
Creatinine, Ser: 1.65 mg/dL — ABNORMAL HIGH (ref 0.44–1.00)
GFR calc Af Amer: 32 mL/min — ABNORMAL LOW (ref >60)
GFR calc non Af Amer: 28 mL/min — ABNORMAL LOW (ref >60)
Glucose, Bld: 117 mg/dL — ABNORMAL HIGH (ref 70–99)
Potassium: 3.7 mmol/L (ref 3.5–5.1)
Sodium: 135 mmol/L (ref 135–145)

## 2019-11-11 MED ORDER — METOPROLOL SUCCINATE ER 25 MG PO TB24: 75.0000 mg | ORAL_TABLET | Freq: Every day | ORAL | 3 refills | Status: DC

## 2019-11-11 NOTE — Discharge Summary (Addendum)
Discharge Summary    Patient ID: [name] MRN: [id number]; DOB: 1932-09-28  Admit date: 11/08/2019 Discharge date: 11/11/2019  Primary Care Provider: Rosine Door  Primary Cardiologist: [facility]  Primary Electrophysiologist:  Cristopher Peru, MD   Discharge Diagnoses    Active Problems:   Hypokalemia   Chronic kidney disease, stage III (moderate)   Persistent atrial fibrillation   Unstable angina (HCC)   Pressure injury of skin  Diagnostic Studies/Procedures    Stress test 11/09/19:   No T wave inversion was noted during stress.  This is a low risk study.  No ischemia or infarction perfusion images. No ischemic ECG changes. Study is low risk.  TEE/DCCV 11/10/19:  1. No LAA thrombus DCC x 2 performed with 150J then 200J converted to  NSR.  2. Left ventricular ejection fraction, by estimation, is 60 to 65%. The  left ventricle has normal function. The left ventricle has no regional  wall motion abnormalities.  3. Right ventricular systolic function is normal. The right ventricular  size is normal.  4. Left atrial size was moderately dilated. No left atrial/left atrial  appendage thrombus was detected.  5. The mitral valve is normal in structure. Trivial mitral valve  regurgitation. No evidence of mitral stenosis.  6. The aortic valve is tricuspid. Aortic valve regurgitation is mild.  Mild aortic valve stenosis.  7. The inferior vena cava is normal in size with greater than 50%  respiratory variability, suggesting right atrial pressure of 3 mmHg.   Conclusion(s)/Recommendation(s): Normal biventricular function without  evidence of hemodynamically significant valvular heart disease.   History of Present Illness     [name] is a 86 y.o. female with a hx of persistent AF, NSVT, HTN, CAD s/p PCI to LCX in 2015, HFpEF who presented to [facility] with chest pain and diaphoresis.   [name] presented to the hospital with a complaint of  chest pain, which seemed to be left-sided to the just left of sternum, described like a heaviness, with associated with shortness of breath, nausea and diaphoresis. Also reports more recent hx of dyspneia on exertion. Activities which were previously easy for her like walking from her bedroom/bathroom, she now has to stop to take a break because of severe shortness of breath. She denied LE edema or orthopnea symptoms. Due to pain persistence, she called EMS for transport to the ED for further evaluation. In the ED, VS unremarkable and troponin normal x2.  ECG with sinus rhythm and no ischemia.  Last echocardiogram 09/20/19 with EF 60-65, mild LVH, She had a cath 05/16/18 which showed 20% LAD, 30% LCx, nondominant RCA with 90% ostial lesion. Stress test from 07/02/2017 with no ischemia.   Hospital Course     She underwent stress testing 11/09/19 that showed no ischemia or infarct. Given her persisent AF, plan was to attempt to restore NSR and see if her SOB iproves. TEE/DCCV performed 11/10/19 with no evidence of LAA thrombus. [place] x 2 performed with 150J then 200J converted to  NSR. Left ventricular ejection fraction, by estimation, is 60 to 65% with no RWMA. Left atrial size was moderately dilated.   Chest pain: -Pt presented withprogressiveSOB with acute change in symptoms>>>also had associated chest pain which seems to be musculoskeletal from coughing>>reproducible on exam with negative stress as below -Last cath 2020 with no obstructive disease (20% LAD, 30% LCx, nondominant RCA with 90% ostial lesion) -Plan was to undergo stress testing>>perfomed 11/09/19 which showed no ischemia or infarct>>low  risk study  Persistent atrial fibrillation: -TEE/DCCV to NSR on 11/10/2019 -Maintaining NSR -Respiratory symptoms felt to improve with restoration of NSR  -BNPelevated at admission -Creatinine stabilizing>>1.65 today6 -Continue home apixaban and diltiazem  HFpEF: -Echocardiogram from  6/5/2021with stable EF -Continue home Lasix 40 mg daily  Acute on chronic kidney disease Stage III: -Creatinine 1.65 today, down from 1.78 yesterday -Hold diuretic and follow renal function closely  Hypokalemia: -Improved K+ at 3.7 today   Consultants: None   The patient was seen and examined by Dr. Acie Fredrickson who feels that she is stable and ready for discharge today, 11/11/19  Did the patient have an acute coronary syndrome (MI, NSTEMI, STEMI, etc) this admission?:  No                               Did the patient have a percutaneous coronary intervention (stent / angioplasty)?:  No.   _____________  Discharge Vitals Blood pressure (!) 119/46, pulse 64, temperature 97.9 F (36.6 C), temperature source Oral, resp. rate 15, height 5\' 1"  (1.549 m), weight 71.8 kg, SpO2 94 %.  Filed Weights   11/09/19 0115 11/10/19 0500 11/11/19 0417  Weight: 73.3 kg 73.1 kg 71.8 kg    Labs & Radiologic Studies    CBC Recent Labs    11/08/19 1953  WBC 9.2  HGB 9.2*  HCT 30.7*  MCV 82.3  PLT 109*   Basic Metabolic Panel Recent Labs    11/10/19 0502 11/11/19 0420  NA 136 135  K 3.2* 3.7  CL 99 101  CO2 25 25  GLUCOSE 118* 117*  BUN 18 21  CREATININE 1.78* 1.65*  CALCIUM 8.8* 8.7*   Liver Function Tests No results for input(s): AST, ALT, ALKPHOS, BILITOT, PROT, ALBUMIN in the last 72 hours. No results for input(s): LIPASE, AMYLASE in the last 72 hours. High Sensitivity Troponin:   Recent Labs  Lab 11/08/19 1953 11/08/19 2110  TROPONINIHS 7 6    BNP Invalid input(s): POCBNP D-Dimer No results for input(s): DDIMER in the last 72 hours. Hemoglobin A1C Recent Labs    11/09/19 1825  HGBA1C 7.0*   Fasting Lipid Panel Recent Labs    11/10/19 0502  CHOL 152  HDL 38*  LDLCALC 98  TRIG 82  CHOLHDL 4.0   Thyroid Function Tests No results for input(s): TSH, T4TOTAL, T3FREE, THYROIDAB in the last 72 hours.  Invalid input(s): FREET3 _____________  NM Myocar Multi  W/Spect W/Wall Motion / EF  Result Date: 11/09/2019  No T wave inversion was noted during stress.  This is a low risk study.  No ischemia or infarction perfusion images. No ischemic ECG changes. Study is low risk.   DG Chest Portable 1 View  Result Date: 11/08/2019 CLINICAL DATA:  Chest pain, short of breath, atrial fibrillation, previous tobacco abuse EXAM: PORTABLE CHEST 1 VIEW COMPARISON:  09/19/2019 FINDINGS: Single frontal view of the chest demonstrates a stable cardiac silhouette. There is chronic interstitial prominence, consistent with history of tobacco abuse. No acute airspace disease, effusion, or pneumothorax. No acute bony abnormalities. IMPRESSION: 1. Chronic interstitial lung disease.  No acute process. Electronically Signed   By: Randa Ngo M.D.   On: 11/08/2019 19:11   ECHO TEE  Result Date: 11/10/2019    TRANSESOPHOGEAL ECHO REPORT   Patient Name:   ASANTI CRAIGO Date of Exam: 11/10/2019 Medical Rec #:  [id number]      Height:  61.0 in Accession #:    [id number]     Weight:       161.2 lb Date of Birth:  [birth year]       BSA:          1.723 m Patient Age:    50 years       BP:           145/66 mmHg Patient Gender: F              HR:           56 bpm. Exam Location:  Inpatient Procedure: Limited Echo, Cardiac Doppler and Color Doppler Indications:    Atrial Fibrillation  History:        Patient has prior history of Echocardiogram examinations, most                 recent 09/20/2019. CHF, CAD and Previous Myocardial Infarction,                 COPD, Arrythmias:Atrial Fibrillation, Signs/Symptoms:Dyspnea and                 Chest Pain; Risk Factors:Dyslipidemia, Hypertension and Former                 Smoker. GERD. DOE.  Sonographer:    Vickie Epley RDCS Referring Phys: [id number] [place]: The transesophogeal probe was passed without difficulty through the esophogus of the patient. Local oropharyngeal anesthetic was provided with Cetacaine. Sedation performed by  different physician. The patient was monitored while under deep sedation. Anesthestetic sedation was provided intravenously by Anesthesiology: 78.58mg  of Propofol, 20mg  of Lidocaine. The patient's vital signs; including heart rate, blood pressure, and oxygen saturation; remained stable throughout the procedure. The patient developed no complications during the procedure. IMPRESSIONS  1. No LAA thrombus DCC x 2 performed with 150J then 200J converted to NSR.  2. Left ventricular ejection fraction, by estimation, is 60 to 65%. The left ventricle has normal function. The left ventricle has no regional wall motion abnormalities.  3. Right ventricular systolic function is normal. The right ventricular size is normal.  4. Left atrial size was moderately dilated. No left atrial/left atrial appendage thrombus was detected.  5. The mitral valve is normal in structure. Trivial mitral valve regurgitation. No evidence of mitral stenosis.  6. The aortic valve is tricuspid. Aortic valve regurgitation is mild. Mild aortic valve stenosis.  7. The inferior vena cava is normal in size with greater than 50% respiratory variability, suggesting right atrial pressure of 3 mmHg. Conclusion(s)/Recommendation(s): Normal biventricular function without evidence of hemodynamically significant valvular heart disease. FINDINGS  Left Ventricle: Left ventricular ejection fraction, by estimation, is 60 to 65%. The left ventricle has normal function. The left ventricle has no regional wall motion abnormalities. The left ventricular internal cavity size was normal in size. There is  no left ventricular hypertrophy. Right Ventricle: The right ventricular size is normal. No increase in right ventricular wall thickness. Right ventricular systolic function is normal. Left Atrium: Spontaneoius contrast but. Left atrial size was moderately dilated. No left atrial/left atrial appendage thrombus was detected. Right Atrium: Right atrial size was normal in  size. Pericardium: There is no evidence of pericardial effusion. Mitral Valve: The mitral valve is normal in structure. Normal mobility of the mitral valve leaflets. Trivial mitral valve regurgitation. No evidence of mitral valve stenosis. Tricuspid Valve: The tricuspid valve is normal in structure. Tricuspid valve regurgitation is mild . No evidence of tricuspid  stenosis. Aortic Valve: The aortic valve is tricuspid. Aortic valve regurgitation is mild. Mild aortic stenosis is present. Aortic valve mean gradient measures 5.0 mmHg. Aortic valve peak gradient measures 9.6 mmHg. Pulmonic Valve: The pulmonic valve was normal in structure. Pulmonic valve regurgitation is mild. No evidence of pulmonic stenosis. Aorta: The aortic root is normal in size and structure. Venous: The inferior vena cava is normal in size with greater than 50% respiratory variability, suggesting right atrial pressure of 3 mmHg. IAS/Shunts: The interatrial septum appears to be lipomatous. No atrial level shunt detected by color flow Doppler. Additional Comments: No LAA thrombus DCC x 2 performed with 150J then 200J converted to NSR.  AORTIC VALVE AV Vmax:      155.00 cm/s AV Vmean:     107.000 cm/s AV VTI:       0.316 m AV Peak Grad: 9.6 mmHg AV Mean Grad: 5.0 mmHg Jenkins Rouge MD Electronically signed by Jenkins Rouge MD Signature Date/Time: 11/10/2019/2:29:48 PM    Final    Disposition   Pt is being discharged home today in good condition.  Follow-up Plans & Appointments     Follow-up Information    Verta Ellen., NP Follow up on 11/18/2019.   Specialty: Cardiology Why: at 11:30 Contact information: [place] [address] [phone]              Discharge Instructions    Call MD for:   Complete by: As directed    Call MD for:  difficulty breathing, headache or visual disturbances   Complete by: As directed    Call MD for:  extreme fatigue   Complete by: As directed    Call MD for:  hives    Complete by: As directed    Call MD for:  persistant dizziness or light-headedness   Complete by: As directed    Call MD for:  persistant nausea and vomiting   Complete by: As directed    Call MD for:  redness, tenderness, or signs of infection (pain, swelling, redness, odor or green/yellow discharge around incision site)   Complete by: As directed    Call MD for:  severe uncontrolled pain   Complete by: As directed    Call MD for:  temperature >100.4   Complete by: As directed    Diet - low sodium heart healthy   Complete by: As directed    Increase activity slowly   Complete by: As directed    Remove dressing in 24 hours   Complete by: As directed       Discharge Medications   Allergies as of 11/11/2019      Reactions   Cephalosporins Diarrhea, Nausea Only   Lightheaded   Levaquin [levofloxacin In D5w] Swelling   Macrodantin [nitrofurantoin Macrocrystal] Swelling   Phenothiazines Anaphylaxis, Hives   Polysorbate Anaphylaxis   Prednisone Shortness Of Breath   Buspirone Itching   Cardura [doxazosin Mesylate] Itching   Codeine Itching   Acyclovir And Related Itching   Redness of skin   Colcrys [colchicine] Nausea Only   Prochlorperazine Other (See Comments)   "Upset stomach"   Ranexa [ranolazine]    Severe drop in BP   Atorvastatin Hives   Cramping; tolerates Crestor ok   Ofloxacin Rash   Other Itching, Rash   "WOOL"= make skin look like it has been burned   Penicillins Other (See Comments)   Causes redness all over. Has patient had a PCN reaction causing immediate  rash, facial/tongue/throat swelling, SOB or lightheadedness with hypotension: No Has patient had a PCN reaction causing severe rash involving mucus membranes or skin necrosis: No Has patient had a PCN reaction that required hospitalization No Has patient had a PCN reaction occurring within the last 10 years: No If all of the above answers are "NO", then may proceed with Cephalosporin use.   Pimozide  Hives, Itching      Medication List    TAKE these medications   acetaminophen 500 MG tablet Commonly known as: TYLENOL Take 500 mg by mouth every 6 (six) hours as needed for headache.   Allergy Relief 10 MG tablet Generic drug: loratadine Take 10 mg by mouth daily.   ALPRAZolam 0.5 MG tablet Commonly known as: XANAX Take 0.5 mg by mouth 2 (two) times daily.   apixaban 5 MG Tabs tablet Commonly known as: Eliquis Take 1 tablet (5 mg total) by mouth 2 (two) times daily.   diltiazem 180 MG 24 hr capsule Commonly known as: CARDIZEM CD Take 1 capsule (180 mg total) by mouth daily.   DULoxetine 60 MG capsule Commonly known as: CYMBALTA Take 60 mg by mouth at bedtime.   fluticasone 0.05 % cream Commonly known as: CUTIVATE Apply 1 application topically daily.   fluticasone 50 MCG/ACT nasal spray Commonly known as: FLONASE Place 2 sprays into both nostrils 2 (two) times daily as needed for allergies.   furosemide 40 MG tablet Commonly known as: LASIX Take 1 tablet (40 mg total) by mouth daily.   ipratropium-albuterol 0.5-2.5 (3) MG/3ML Soln Commonly known as: DUONEB Take 3 mLs by nebulization every 6 (six) hours.   isosorbide mononitrate 60 MG 24 hr tablet Commonly known as: IMDUR Take 1 tablet (60 mg total) by mouth daily.   levothyroxine 25 MCG tablet Commonly known as: SYNTHROID Take 1 tablet by mouth daily.   magnesium oxide 400 MG tablet Commonly known as: MAG-OX TAKE ONE TABLET BY MOUTH DAILY.   methocarbamol 500 MG tablet Commonly known as: ROBAXIN Take 500 mg by mouth every 6 (six) hours as needed for muscle spasms.   metoprolol succinate 25 MG 24 hr tablet Commonly known as: TOPROL-XL Take 3 tablets (75 mg total) by mouth daily. Take with or immediately following a meal. Start taking on: November 12, 2019 What changed:   medication strength  how much to take  how to take this  when to take this  additional instructions   multivitamin with  minerals Tabs tablet Take 1 tablet by mouth daily. Centrum   nitroGLYCERIN 0.4 MG SL tablet Commonly known as: NITROSTAT Place 1 tablet (0.4 mg total) under the tongue every 5 (five) minutes as needed for chest pain. Reported on 08/04/2015   pantoprazole 40 MG tablet Commonly known as: PROTONIX Take 40 mg by mouth daily.   potassium chloride 10 MEQ tablet Commonly known as: KLOR-CON Take 1 tablet (10 mEq total) by mouth daily.   sodium chloride 0.65 % Soln nasal spray Commonly known as: OCEAN Place 1 spray into both nostrils as needed (allergies).   traMADol 50 MG tablet Commonly known as: ULTRAM Take 50 mg by mouth every 6 (six) hours as needed for pain.       Outstanding Labs/Studies   BMET  Duration of Discharge Encounter   Greater than 30 minutes including physician time.  Signed, Kathyrn Drown, NP 11/11/2019, 10:47 AM    Attending Note:   The patient was seen and examined.  Agree with assessment and plan as noted  above.  Changes made to the above note as needed.  Patient seen and independently examined with Kathyrn Drown, NP .   We discussed all aspects of the encounter. I agree with the assessment and plan as stated above.  1.   CAD :  Has mild CAD .  myoview was low risk   2.  Atrial fib:  S/p TEE / CV  See progress note from same day   Stable . Ready to go home   I have spent a total of 40 minutes with patient reviewing hospital  notes , telemetry, EKGs, labs and examining patient as well as establishing an assessment and plan that was discussed with the patient. > 50% of time was spent in direct patient care.    Thayer Headings, Brooke Bonito., MD, [facility] 11/11/2019, 2:44 PM [address]  [place] Pager [phone]

## 2019-11-11 NOTE — Progress Notes (Addendum)
Progress Note  Patient Name: [name] Date of Encounter: 11/11/2019  Primary Cardiologist: Kate Sable, MD (Inactive)   Subjective   Maintaining NSR on tele. Feels better. Remains tired today   Inpatient Medications    Scheduled Meds: . apixaban  5 mg Oral BID  . diltiazem  180 mg Oral Daily  . DULoxetine  60 mg Oral QHS  . furosemide  40 mg Oral Daily  . isosorbide mononitrate  60 mg Oral Daily  . metoprolol succinate  75 mg Oral Daily  . pantoprazole  40 mg Oral Daily  . sodium chloride flush  3 mL Intravenous Once   Continuous Infusions:  PRN Meds: acetaminophen, ALPRAZolam, nitroGLYCERIN, ondansetron (ZOFRAN) IV   Vital Signs    Vitals:   11/10/19 1348 11/10/19 1622 11/10/19 1948 11/11/19 0417  BP: (!) 124/45 (!) 122/45 (!) 135/52 (!) 119/46  Pulse: 58 61 69 64  Resp: 22 15 17 15   Temp:  (!) 97.5 F (36.4 C) 97.7 F (36.5 C) 97.9 F (36.6 C)  TempSrc:  Oral Oral Oral  SpO2: 98% 96% 100% 94%  Weight:    71.8 kg  Height:        Intake/Output Summary (Last 24 hours) at 11/11/2019 0938 Last data filed at 11/11/2019 0400 Gross per 24 hour  Intake 870 ml  Output 1000 ml  Net -130 ml   Filed Weights   11/09/19 0115 11/10/19 0500 11/11/19 0417  Weight: 73.3 kg 73.1 kg 71.8 kg    Physical Exam   General: Elderly, NAD Neck: Negative for carotid bruits. No JVD Lungs: Diminished in bilateral lower lobes. Breathing is unlabored. Cardiovascular: RRR with S1 S2. No murmur Abdomen: Soft, non-tender, non-distended. No obvious abdominal masses. Extremities: No edema. Radial pulses 2+ bilaterally Neuro: Alert and oriented. No focal deficits. No facial asymmetry. MAE spontaneously. Psych: Responds to questions appropriately with normal affect.    Labs    Chemistry Recent Labs  Lab 11/08/19 1953 11/10/19 0502 11/11/19 0420  NA 136 136 135  K 3.7 3.2* 3.7  CL 100 99 101  CO2 24 25 25   GLUCOSE 108* 118* 117*  BUN 21 18 21   CREATININE  1.31* 1.78* 1.65*  CALCIUM 9.2 8.8* 8.7*  GFRNONAA 37* 26* 28*  GFRAA 43* 30* 32*  ANIONGAP 12 12 9      Hematology Recent Labs  Lab 11/08/19 1953 11/09/19 1825  WBC 9.2  --   RBC 3.73* 3.86*  HGB 9.2*  --   HCT 30.7*  --   MCV 82.3  --   MCH 24.7*  --   MCHC 30.0  --   RDW 14.7  --   PLT 441*  --     Cardiac EnzymesNo results for input(s): TROPONINI in the last 168 hours. No results for input(s): TROPIPOC in the last 168 hours.   BNP Recent Labs  Lab 11/09/19 1825  BNP 365.4*     DDimer No results for input(s): DDIMER in the last 168 hours.   Radiology    NM Myocar Multi W/Spect W/Wall Motion / EF  Result Date: 11/09/2019  No T wave inversion was noted during stress.  This is a low risk study.  No ischemia or infarction perfusion images. No ischemic ECG changes. Study is low risk.   ECHO TEE  Result Date: 11/10/2019    TRANSESOPHOGEAL ECHO REPORT   Patient Name:   [name] Date of Exam: 11/10/2019 Medical Rec #:  [id number]  Height:       61.0 in Accession #:    [id number]     Weight:       161.2 lb Date of Birth:  08-13-1932       BSA:          1.723 m Patient Age:    86 years       BP:           145/66 mmHg Patient Gender: F              HR:           56 bpm. Exam Location:  Inpatient Procedure: Limited Echo, Cardiac Doppler and Color Doppler Indications:    Atrial Fibrillation  History:        Patient has prior history of Echocardiogram examinations, most                 recent 09/20/2019. CHF, CAD and Previous Myocardial Infarction,                 COPD, Arrythmias:Atrial Fibrillation, Signs/Symptoms:Dyspnea and                 Chest Pain; Risk Factors:Dyslipidemia, Hypertension and Former                 Smoker. GERD. DOE.  Sonographer:    Vickie Epley RDCS Referring Phys: [id number] [facility]: The transesophogeal probe was passed without difficulty through the esophogus of the patient. Local oropharyngeal anesthetic was provided with Cetacaine.  Sedation performed by different physician. The patient was monitored while under deep sedation. Anesthestetic sedation was provided intravenously by Anesthesiology: 78.58mg  of Propofol, 20mg  of Lidocaine. The patient's vital signs; including heart rate, blood pressure, and oxygen saturation; remained stable throughout the procedure. The patient developed no complications during the procedure. IMPRESSIONS  1. No LAA thrombus DCC x 2 performed with 150J then 200J converted to NSR.  2. Left ventricular ejection fraction, by estimation, is 60 to 65%. The left ventricle has normal function. The left ventricle has no regional wall motion abnormalities.  3. Right ventricular systolic function is normal. The right ventricular size is normal.  4. Left atrial size was moderately dilated. No left atrial/left atrial appendage thrombus was detected.  5. The mitral valve is normal in structure. Trivial mitral valve regurgitation. No evidence of mitral stenosis.  6. The aortic valve is tricuspid. Aortic valve regurgitation is mild. Mild aortic valve stenosis.  7. The inferior vena cava is normal in size with greater than 50% respiratory variability, suggesting right atrial pressure of 3 mmHg. Conclusion(s)/Recommendation(s): Normal biventricular function without evidence of hemodynamically significant valvular heart disease. FINDINGS  Left Ventricle: Left ventricular ejection fraction, by estimation, is 60 to 65%. The left ventricle has normal function. The left ventricle has no regional wall motion abnormalities. The left ventricular internal cavity size was normal in size. There is  no left ventricular hypertrophy. Right Ventricle: The right ventricular size is normal. No increase in right ventricular wall thickness. Right ventricular systolic function is normal. Left Atrium: Spontaneoius contrast but. Left atrial size was moderately dilated. No left atrial/left atrial appendage thrombus was detected. Right Atrium: Right atrial  size was normal in size. Pericardium: There is no evidence of pericardial effusion. Mitral Valve: The mitral valve is normal in structure. Normal mobility of the mitral valve leaflets. Trivial mitral valve regurgitation. No evidence of mitral valve stenosis. Tricuspid Valve: The tricuspid valve is normal in structure. Tricuspid valve regurgitation  is mild . No evidence of tricuspid stenosis. Aortic Valve: The aortic valve is tricuspid. Aortic valve regurgitation is mild. Mild aortic stenosis is present. Aortic valve mean gradient measures 5.0 mmHg. Aortic valve peak gradient measures 9.6 mmHg. Pulmonic Valve: The pulmonic valve was normal in structure. Pulmonic valve regurgitation is mild. No evidence of pulmonic stenosis. Aorta: The aortic root is normal in size and structure. Venous: The inferior vena cava is normal in size with greater than 50% respiratory variability, suggesting right atrial pressure of 3 mmHg. IAS/Shunts: The interatrial septum appears to be lipomatous. No atrial level shunt detected by color flow Doppler. Additional Comments: No LAA thrombus DCC x 2 performed with 150J then 200J converted to NSR.  AORTIC VALVE AV Vmax:      155.00 cm/s AV Vmean:     107.000 cm/s AV VTI:       0.316 m AV Peak Grad: 9.6 mmHg AV Mean Grad: 5.0 mmHg Jenkins Rouge MD Electronically signed by Jenkins Rouge MD Signature Date/Time: 11/10/2019/2:29:48 PM    Final    Telemetry    11/11/19 NSR- Personally Reviewed  ECG    11/10/2019 post cardioversion EKG with sinus bradycardia with HR 57 bpm- Personally Reviewed  Cardiac Studies   Stress test 11/09/19:   No T wave inversion was noted during stress.  This is a low risk study.  No ischemia or infarction perfusion images. No ischemic ECG changes. Study is low risk.    Echo 09/20/2019:  1. Left ventricular ejection fraction, by estimation, is 60 to 65%. The  left ventricle has normal function. The left ventricle has no regional  wall motion  abnormalities. There is mild left ventricular hypertrophy.  Left ventricular diastolic parameters  are indeterminate.  2. Right ventricular systolic function is normal. The right ventricular  size is normal. There is normal pulmonary artery systolic pressure.  3. The mitral valve is normal in structure. Trivial mitral valve  regurgitation. No evidence of mitral stenosis.  4. The aortic valve was not well visualized. Aortic valve regurgitation  is mild. No aortic stenosis is present.   Patient Profile     86 y.o. female with persistent AF, NSVT, HTN, CAD s/p PCI to LCX in 2015, HFpEF who presents with chest pain, diaphoresis.   Assessment & Plan    1. Chest pain: -Pt presented with progressive SOB with acute change in symptoms>>>also had associated chest pain which seems to be musculoskeletal from coughing>>reproducible on exam with negative stress as below  -Last cath 2020 with no obstructive disease (20% LAD, 30% LCx, nondominant RCA with 90% ostial lesion) -Plan was to undergo stress testing>>perfomed 11/09/19 which showed no ischemia or infarct>>low risk study    2. Persistent atrial fibrillation: -TEE/DCCV to NSR on 11/10/2019 -Maintaining NSR -Respiratory symptoms felt to improve with restoration of NSR  -BNP elevated at admission  -Creatinine stabilizing>>1.65 today -Continue home apixaban and diltiazem  3. HFpEF: -Echocardiogram from 09/20/2019 with stable EF  -Continue home Lasix 40 mg daily  4. Acute on chronic kidney disease Stage III: -Creatinine 1.65 today, down from 1.78 yesterday -Hold diuretic and follow renal function closely   5. Hypokalemia: -Improved K+ at 3.7 today    Signed, Kathyrn Drown NP-C [place] Pager: [phone] 11/11/2019, 9:38 AM     For questions or updates, please contact   Please consult [email or web address] for contact info under Cardiology/STEMI.  Attending Note:   The patient was seen and examined.  Agree with assessment and  plan as noted above.  Changes made to the above note as needed.  Patient seen and independently examined with Kathyrn Drown, NP .   We discussed all aspects of the encounter. I agree with the assessment and plan as stated above.  1.   CAD :  Has mild CAD .  myoview was low risk   2.  Atrial fib:  S/p TEE / CV  Stable . Ready to go home   I have spent a total of 40 minutes with patient reviewing hospital  notes , telemetry, EKGs, labs and examining patient as well as establishing an assessment and plan that was discussed with the patient. > 50% of time was spent in direct patient care.     Thayer Headings, Brooke Bonito., MD, [facility] 11/11/2019, 9:53 AM [address],  [place] Pager [phone]

## 2019-11-11 NOTE — Progress Notes (Signed)
D/C instructions given and reviewed. Questions answered at this time. IV removed. Tolerated well. Daughter, Otila Kluver, on way from [place].

## 2019-11-17 NOTE — Progress Notes (Signed)
Cardiology Office Note  Date: 11/18/2019   ID: KYNNADI [name], DOB 11-Oct-1932, MRN [id number]  PCP:  Rosalee Kaufman, PA-C  Cardiologist:  No primary care provider on file. Electrophysiologist:  Cristopher Peru, MD   Chief Complaint: Follow-up chronic diastolic heart failure, PAF, NSVT, hypertension.  History of Present Illness: [name] is a 86 y.o. female with a history of chronic diastolic heart failure, PAF, NSVT, hypertension, CAD, HLD, HTN  Last saw Dr. Lovena Le 06/17/2019.  She had gained some weight and had not been taking her Lasix.  He asked her to go back on Lasix 40 mg and her weight was below 165.[name]  He stated her cardiac monitor was reassuring and had her continue her Toprol.  She had 1 single episode of orthostasis.  He stated she would not hold her Eliquis but if she continued to pass out would suggest this.  She saw Dr. Bronson Ing previously on 06/26/2019 via telemedicine visit.  She continued to have chronic exertional dyspnea felt secondary to deconditioning and having NYHA class II symptoms.  Expressing multiple somatic complaints including back pain, knee pain, feet pain, insomnia.  Stated she continues to be depressed due to the death of her son from a motorcycle accident 03/30/2019.  States she has been having panic attacks since that event.  She complained of chronic left-sided chest pains and stated changing positions in the bed to help.  She stated her palpitations seldom occurred.  Here today for follow-up.  She denies any particular cardiac complaints.  No complaints of angina or exertional symptoms, palpitations or arrhythmias, orthostatic symptoms, stroke or TIA-like symptoms, PND orthopnea, bleeding issues, claudication-like issues, DVT or lower extremity edema.  Primary complaints stem from her low back pain with numbness down both lower extremities and sciatica-like pain down the right leg.  States she has had previous back surgeries at [facility].  And would like to be referred for consultation for back pain.  She has had some recent falls according to her recent note from her PCP.  Had a recent fall with a fractured rib per her statement.  Recent admission on 11/08/2019 with complaints of chest pain/left-sided just left of the sternum described as a heaviness with associated shortness of breath, nausea, and diaphoresis.  More recent dyspnea on exertion.  In emergency room vital signs unremarkable.  Troponin negative x2.  EKG sinus rhythm no ischemia.  Underwent stress test on 11/09/2019 showing no ischemia or infarct.  Given her persistent atrial fib plan was to attempt to restore sinus rhythm and see if shortness of breath improved.  TEE/DCCV performed 11/10/2019 with no evidence of  LAA thrombus.  Had direct-current shocks x2 with 150 J then 200 J converting to normal sinus rhythm. She was continuing home apixaban and diltiazem.  She was continuing Lasix 40 mg daily for chronic diastolic heart failure.    She is here for follow-up status post recent DC cardioversion.  She remains in a regular rhythm.  Heart rate is 56 today and regular on auscultation.  She denies any recent palpitations.  States she continues to have some chest pressure and some shortness of breath when ambulating.  States her activity is severely limited due to significant back issues and associated pain from back problems..  She has had several back surgeries and has severe osteoporosis.  She denies any orthostatic symptoms, CVA or TIA-like symptoms, bleeding issues, PND, orthopnea.  No claudication-like symptoms, DVT or PE-like symptoms, or  lower extremity edema.    Past Medical History:  Diagnosis Date  . Anxiety   . Arthritis   . Atrial fibrillation ([place])   . Bursitis    Left shoulder  . Cataract   . CHF (congestive heart failure) ([place])   . COPD (chronic obstructive pulmonary disease) ([place])   . Coronary atherosclerosis of native coronary artery     a. s/p DES to LCx in 04/2013 b. cath in 11/2015 showing patent stent with 20% prox-LAD and 80% ostial RCA stenosis for which medical management was recommended due to small artery size  . Depression   . Diastolic heart failure (HCC)    EF 55-60%  . Dysphagia, unspecified(787.20)   . Dyspnea   . Dysrhythmia   . Essential hypertension   . GERD (gastroesophageal reflux disease)    Hx Schatzki's ring, multiple EGD/ED last 01/06/2004  . Gout   . Headache   . History of anemia   . Hyperlipidemia   . Internal hemorrhoids without mention of complication   . MI (myocardial infarction) ([place]) 2006  . Microscopic colitis 2003  . Panic disorder without agoraphobia   . Paresthesia   . Pneumonia 12/2011  . PVD (peripheral vascular disease) ([place])   . S/P colonoscopy 09/27/2001   internal hemorrhoids, desc colon inflam polyp, SB BX-chronic duodenitis, colitis  . Thyroid disease     Past Surgical History:  Procedure Laterality Date  . ABDOMINAL HYSTERECTOMY    . ABDOMINAL HYSTERECTOMY    . ANTERIOR AND POSTERIOR REPAIR     with resection of vagina  . ANTERIOR LAT LUMBAR FUSION N/A 08/01/2016   Procedure: Lumbar Two-Lumbar Five Transpsoas lateral interbody fusion with Lumbar Two-Three lateral plate fixation;  Surgeon: Kevan Ny Ditty, MD;  Location: [place];  Service: Neurosurgery;  Laterality: N/A;  L2-5 Transpsoas lateral interbody fusion with L2-3 lateral plate fixation  . APPENDECTOMY    . BACK SURGERY    . BIOPSY  07/05/2015   Procedure: BIOPSY;  Surgeon: Daneil Dolin, MD;  [facility];  Service: Endoscopy;;  gastric polyp biopsy, ascending colon biopsy  . BLADDER SUSPENSION  11/09/2011   Procedure: TRANSVAGINAL TAPE (TVT) PROCEDURE;  Surgeon: Marissa Nestle, MD;  Location: [facility];  Service: Urology;  Laterality: N/A;  . bladder tack  06/2010  . BREAST LUMPECTOMY  1998   left, benign  . CARDIAC CATHETERIZATION    . CARDIAC CATHETERIZATION    . CARDIAC CATHETERIZATION N/A  12/16/2015   Procedure: Left Heart Cath and Coronary Angiography;  Surgeon: Troy Sine, MD;  Location: [place] CV [facility];  Service: Cardiovascular;  Laterality: N/A;  . CARDIOVERSION N/A 10/04/2017   Procedure: CARDIOVERSION;  Surgeon: Herminio Commons, MD;  Location: [facility];  Service: Cardiovascular;  Laterality: N/A;  . CARDIOVERSION N/A 01/30/2018   Procedure: CARDIOVERSION;  Surgeon: Herminio Commons, MD;  [facility];  Service: Cardiovascular;  Laterality: N/A;  . CARDIOVERSION N/A 11/10/2019   Procedure: CARDIOVERSION;  Surgeon: Josue Hector, MD;  Location: [place];  Service: Cardiovascular;  Laterality: N/A;  . [place]   left  . cataract surgery    . CHOLECYSTECTOMY  1998  . Cholecystectomy    . COLONOSCOPY  03/16/2011   multiple hyperplastic colon polyps, sigmoid diverticulosis, melanosis coli  . COLONOSCOPY WITH PROPOFOL N/A 07/05/2015   RMR:one 5 mm polyp in descending colon  . CORONARY ANGIOGRAPHY N/A 05/16/2018   Procedure: CORONARY ANGIOGRAPHY (CATH LAB);  Surgeon:  Belva Crome, MD;  Location: [place] [facility];  Service: Cardiovascular;  Laterality: N/A;  . CORONARY ANGIOPLASTY WITH STENT PLACEMENT    . ESOPHAGEAL DILATION N/A 07/05/2015   Procedure: ESOPHAGEAL DILATION;  Surgeon: Daneil Dolin, MD;  [facility];  Service: Endoscopy;  Laterality: N/A;  . ESOPHAGOGASTRODUODENOSCOPY (EGD) WITH PROPOFOL N/A 07/05/2015   YHC:WCBJSE  . JOINT REPLACEMENT Right 2007   right knee  . left hand surgery    . LEFT HEART CATHETERIZATION WITH CORONARY ANGIOGRAM N/A 05/14/2013   Procedure: LEFT HEART CATHETERIZATION WITH CORONARY ANGIOGRAM;  Surgeon: Blane Ohara, MD;  Location: [facility] C[facility] LAB;  Service: Cardiovascular;  Laterality: N/A;  . left rotator cuff surgery    . LUMBAR LAMINECTOMY/DECOMPRESSION MICRODISCECTOMY N/A 10/11/2012   Procedure: LUMBAR LAMINECTOMY/DECOMPRESSION MICRODISCECTOMY 2 LEVELS;  Surgeon: Floyce Stakes, MD;  Location: [place] [facility];  Service: Neurosurgery;  Laterality: N/A;  L3-4 L4-5 Laminectomy  . LUMBAR WOUND DEBRIDEMENT N/A 09/27/2015   Procedure: Exploration of Lumbar Wound w/ Repair CSF Leak/Lumbar Drain Placement;  Surgeon: Leeroy Cha, MD;  Location: [place] [facility];  Service: Neurosurgery;  Laterality: N/A;  . MALONEY DILATION  03/16/2011   Gastritis. No H.pylori on bx. 46F maloney dilation with disruption of  occult cevical esophageal web  . NASAL SINUS SURGERY    . right knee replacement  2007  . right leg benign tumor    . SHOULDER SURGERY Left   . TEE WITHOUT CARDIOVERSION N/A 10/04/2017   Procedure: TRANSESOPHAGEAL ECHOCARDIOGRAM (TEE) WITH PROPOFOL;  Surgeon: Herminio Commons, MD;  [facility];  Service: Cardiovascular;  Laterality: N/A;  . TEE WITHOUT CARDIOVERSION N/A 11/10/2019   Procedure: TRANSESOPHAGEAL ECHOCARDIOGRAM (TEE);  Surgeon: Josue Hector, MD;  Location[facility];  Service: Cardiovascular;  Laterality: N/A;  . TONSILLECTOMY    . unspecified area, hysterectomy  1972   partial    Current Outpatient Medications  Medication Sig Dispense Refill  . acetaminophen (TYLENOL) 500 MG tablet Take 500 mg by mouth every 6 (six) hours as needed for headache.     . ALLERGY RELIEF 10 MG tablet Take 10 mg by mouth daily.    [name] ALPRAZolam (XANAX) 0.5 MG tablet Take 0.5 mg by mouth 2 (two) times daily.    [name] apixaban (ELIQUIS) 5 MG TABS tablet Take 1 tablet (5 mg total) by mouth 2 (two) times daily. 180 tablet 3  . diltiazem (CARDIZEM CD) 180 MG 24 hr capsule Take 1 capsule (180 mg total) by mouth daily. 30 capsule 1  . DULoxetine (CYMBALTA) 60 MG capsule Take 60 mg by mouth at bedtime.     . fluticasone (CUTIVATE) 0.05 % cream Apply 1 application topically daily.    . fluticasone (FLONASE) 50 MCG/ACT nasal spray Place 2 sprays into both nostrils 2 (two) times daily as needed for allergies.     . furosemide (LASIX) 40 MG tablet Take 1 tablet (40 mg total) by  mouth daily. 30 tablet 1  . ipratropium-albuterol (DUONEB) 0.5-2.5 (3) MG/3ML SOLN Take 3 mLs by nebulization every 6 (six) hours. 360 mL 0  . isosorbide mononitrate (IMDUR) 60 MG 24 hr tablet Take 1 tablet (60 mg total) by mouth daily. 90 tablet 3  . levothyroxine (SYNTHROID, LEVOTHROID) 25 MCG tablet Take 1 tablet by mouth daily.     . magnesium oxide (MAG-OX) 400 MG tablet TAKE ONE TABLET BY MOUTH DAILY. 90 tablet 3  . methocarbamol (ROBAXIN) 500 MG tablet Take 500 mg by  mouth every 6 (six) hours as needed for muscle spasms.     . metoprolol succinate (TOPROL-XL) 25 MG 24 hr tablet Take 25 mg by mouth daily. Takes with the 50mg  tablet to equal 75mg  total for the day.    . metoprolol succinate (TOPROL-XL) 50 MG 24 hr tablet Take 50 mg by mouth daily. Takes with the 25mg  tablet to equal 75mg  total for the day.    . Multiple Vitamin (MULTIVITAMIN WITH MINERALS) TABS tablet Take 1 tablet by mouth daily. Centrum     . nitroGLYCERIN (NITROSTAT) 0.4 MG SL tablet Place 1 tablet (0.4 mg total) under the tongue every 5 (five) minutes as needed for chest pain. Reported on 08/04/2015 25 tablet 6  . pantoprazole (PROTONIX) 40 MG tablet Take 40 mg by mouth daily.     . potassium chloride (KLOR-CON) 10 MEQ tablet Take 1 tablet (10 mEq total) by mouth daily. 180 tablet 1  . sodium chloride (OCEAN) 0.65 % SOLN nasal spray Place 1 spray into both nostrils as needed (allergies).     . traMADol (ULTRAM) 50 MG tablet Take 50 mg by mouth every 6 (six) hours as needed for pain.     No current facility-administered medications for this visit.   Allergies:  Cephalosporins, Levaquin [levofloxacin in d5w], Macrodantin [nitrofurantoin macrocrystal], Phenothiazines, Polysorbate, Prednisone, Buspirone, Cardura [doxazosin mesylate], Codeine, Acyclovir and related, Colcrys [colchicine], Prochlorperazine, Ranexa [ranolazine], Atorvastatin, Ofloxacin, Other, Penicillins, and Pimozide   Social History: The patient  reports that  she quit smoking about 18 years ago. Her smoking use included cigarettes. She started smoking about 71 years ago. She has a 64.00 pack-year smoking history. She has never used smokeless tobacco. She reports that she does not drink alcohol and does not use drugs.   Family History: The patient's family history includes Cancer in an other family member; Coronary artery disease in an other family member; Diabetes in her brother and son; Heart disease in her son; Hypertension in an other family member; Parkinson's disease in her father; Stroke in her mother and another family member; Stroke (age of onset: 73) in her daughter.   ROS:  Please see the history of present illness. Otherwise, complete review of systems is positive for none.  All other systems are reviewed and negative.   Physical Exam: VS:  BP 115/60   Pulse (!) 56   Ht 5\' 1"  (1.549 m)   Wt 163 lb 6.4 oz (74.1 kg)   SpO2 96%   BMI 30.87 kg/m , BMI Body mass index is 30.87 kg/m.  Wt Readings from Last 3 Encounters:  11/18/19 163 lb 6.4 oz (74.1 kg)  11/11/19 158 lb 3.2 oz (71.8 kg)  10/06/19 167 lb 3.2 oz (75.8 kg)    General: Patient appears comfortable at rest. Lungs: Clear to auscultation, nonlabored breathing at rest. Cardiac: Regular rate and rhythm, no S3 or significant systolic murmur, no pericardial rub. Extremities: No pitting edema, distal pulses 2+. Skin: Warm and dry. Musculoskeletal: No kyphosis. Neuropsychiatric: Alert and oriented x3, affect grossly appropriate.  ECG:  Recent EKG on September 19, 2019 showed atrial fibrillation with RVR rate of 109.  Recent Labwork: 09/19/2019: ALT 13; AST 20; TSH 3.127 09/22/2019: Magnesium 1.8 11/08/2019: Hemoglobin 9.2; Platelets 441 11/09/2019: B Natriuretic Peptide 365.4 11/11/2019: BUN 21; Creatinine, Ser 1.65; Potassium 3.7; Sodium 135     Component Value Date/Time   CHOL 152 11/10/2019 0502   TRIG 82 11/10/2019 0502   HDL 38 (L) 11/10/2019 0502   CHOLHDL  4.0 11/10/2019 0502    VLDL 16 11/10/2019 0502   LDLCALC 98 11/10/2019 0502    Other Studies Reviewed Today:  Stress test 11/09/19:   No T wave inversion was noted during stress.  This is a low risk study.  No ischemia or infarction perfusion images. No ischemic ECG changes. Study is low risk.  TEE/DCCV 11/10/19:  1. No LAA thrombus DCC x 2 performed with 150J then 200J converted to  NSR.  2. Left ventricular ejection fraction, by estimation, is 60 to 65%. The  left ventricle has normal function. The left ventricle has no regional  wall motion abnormalities.  3. Right ventricular systolic function is normal. The right ventricular  size is normal.  4. Left atrial size was moderately dilated. No left atrial/left atrial  appendage thrombus was detected.  5. The mitral valve is normal in structure. Trivial mitral valve  regurgitation. No evidence of mitral stenosis.  6. The aortic valve is tricuspid. Aortic valve regurgitation is mild.  Mild aortic valve stenosis.  7. The inferior vena cava is normal in size with greater than 50%  respiratory variability, suggesting right atrial pressure of 3 mmHg.   Conclusion(s)/Recommendation(s): Normal biventricular function without  evidence of hemodynamically significant valvular heart disease.   Cardiac Catheterization: 05/16/2018  Patent short left main  20 to 30% proximal LAD irregularity, with diffuse 20% narrowing within the mid vessel.  Patent proximal circumflex stent with mid to distal circumflex irregularities up to 30 to 40%.  Nondominant right coronary with ostial 90% narrowing.  No hemodynamics recorded. Left ventriculography was not performed in an effort to decrease contrast exposure in the setting of CKD.  RECOMMENDATIONS:   Compared to prior angiography in 2017, no significant changes noted.  Chest pain is atypical and may be more musculoskeletal or pleuritic.  Resume Eliquis in 12 hours/a.m. of  05/17/2018   Echocardiogram 10/02/18:  1. The left ventricle has normal systolic function with an ejection  fraction of 60-65%. The cavity size was normal. There is mildly increased  left ventricular wall thickness.  2. The right ventricle has normal systolic function. The cavity was  normal. There is no increase in right ventricular wall thickness.  3. Left atrial size was mildly dilated.  4. The aortic valve is tricuspid. Mild thickening of the aortic valve.  Mild calcification of the aortic valve. Aortic valve regurgitation is mild  by color flow Doppler. No stenosis of the aortic valve. Mild aortic  annular calcification noted.  5. The mitral valve is abnormal. Mild thickening of the mitral valve  leaflet. Mild calcification of the mitral valve leaflet. There is mild  mitral annular calcification present. No evidence of mitral valve  stenosis.  6. The aortic root is normal in size and structure.  7. Pulmonary hypertension is indeterminant, inadequate TR jet.    Event monitor 05/13/19:  1. NSR  2. NSVT, lasting upto 6 beats 3. No prolonged pauses or bradycardia 4. No atrial fib   Assessment and Plan:   1. CAD in native artery DES to LCX 2015.  Cath 05/16/2018 20 to 30% stenosis LAD irregularity with 20% narrowing within the mid vessel.  Patent proximal circumflex stent with mid to distal circumflex irregularities up to 30 to 40%.  Nondominant RCA with ostial 90% narrowing.  No significant changes noted since prior cath 2017.  Denies any recent anginal or exertional symptoms. Continue Imdur 60 mg daily.  Toprol-XL 50 mg daily.  2. Chronic diastolic heart failure ([place]) Recent  TEE 11/10/2019 showed EF 60 to 65%, moderately dilated LA, trivial MR mild AR, mild aortic stenosis  Continue Lasix 40 mg daily.  3. Longstanding persistent atrial fibrillation ([place]) Recent TEE/DCCV performed 11/10/2019 with 3 shocks resulting in normal sinus rhythm.  Chest discomfort was  improved after converted to normal sinus rhythm.   Continue Eliquis 5 mg p.o. twice daily, Cardizem CD 180 mg daily.  Toprol-XL 50 mg daily.  Heart rate is regular today with a rate of 56.  4. Hyperlipidemia LDL goal <70 Last known lipid panel total cholesterol 177, triglycerides 197, HDL 35, LDL 103 12/16/2015.  Not on statin medication.  Request recent lab work from PCP to assess lipid level.  5. NSVT (nonsustained ventricular tachycardia) (HCC)  Event monitor 05/13/2019 NSVT 6 beats denies any recent racing heart or fluttering sensations in her chest.    6.  Bilateral low back pain with right-sided sciatica. Continues to complain of chronic back pain worsening with numbness in both lower legs and sciatica-like pain down the right leg.  States she can barely function on a daily basis due to pain and inability to mobilize as she would like.  She has had previous back surgery at [facility].    Medication Adjustments/Labs and Tests Ordered: Current medicines are reviewed at length with the patient today.  Concerns regarding medicines are outlined above.   Disposition: Follow-up with Signed, Levell July, NP 11/18/2019 12:34 PM    [facility] at [place], [place], [place] [address] Phone: [phone]; Fax: [phone]

## 2019-11-18 ENCOUNTER — Ambulatory Visit (INDEPENDENT_AMBULATORY_CARE_PROVIDER_SITE_OTHER): Payer: Medicare HMO | Admitting: Family Medicine

## 2019-11-18 ENCOUNTER — Encounter: Payer: Self-pay | Admitting: Family Medicine

## 2019-11-18 VITALS — BP 115/60 | HR 56 | Ht 61.0 in | Wt 163.4 lb

## 2019-11-18 DIAGNOSIS — R079 Chest pain, unspecified: Secondary | ICD-10-CM | POA: Diagnosis not present

## 2019-11-18 DIAGNOSIS — N183 Chronic kidney disease, stage 3 unspecified: Secondary | ICD-10-CM | POA: Diagnosis not present

## 2019-11-18 DIAGNOSIS — I5032 Chronic diastolic (congestive) heart failure: Secondary | ICD-10-CM

## 2019-11-18 DIAGNOSIS — I4891 Unspecified atrial fibrillation: Secondary | ICD-10-CM | POA: Diagnosis not present

## 2019-11-18 NOTE — Patient Instructions (Addendum)
Medication Instructions:  Continue all current medications.  Labwork: none  Testing/Procedures: none  Follow-Up: 3 months   Any Other Special Instructions Will Be Listed Below (If Applicable).  If you need a refill on your cardiac medications before your next appointment, please call your pharmacy.  

## 2019-11-30 DIAGNOSIS — J449 Chronic obstructive pulmonary disease, unspecified: Secondary | ICD-10-CM | POA: Diagnosis not present

## 2019-12-02 DIAGNOSIS — R0602 Shortness of breath: Secondary | ICD-10-CM | POA: Diagnosis not present

## 2019-12-02 DIAGNOSIS — I5033 Acute on chronic diastolic (congestive) heart failure: Secondary | ICD-10-CM | POA: Diagnosis not present

## 2019-12-02 DIAGNOSIS — J449 Chronic obstructive pulmonary disease, unspecified: Secondary | ICD-10-CM | POA: Diagnosis not present

## 2019-12-02 DIAGNOSIS — L8990 Pressure ulcer of unspecified site, unspecified stage: Secondary | ICD-10-CM | POA: Diagnosis not present

## 2019-12-02 DIAGNOSIS — J440 Chronic obstructive pulmonary disease with acute lower respiratory infection: Secondary | ICD-10-CM | POA: Diagnosis not present

## 2019-12-02 DIAGNOSIS — I10 Essential (primary) hypertension: Secondary | ICD-10-CM | POA: Diagnosis not present

## 2019-12-11 NOTE — Progress Notes (Signed)
Referring Provider: Rosalee Kaufman, * Primary Care Physician:  Rosalee Kaufman, PA-C Primary GI Physician: Dr. Gala Romney  Chief Complaint  Patient presents with  . Rectal Bleeding    dark stool  . Diarrhea    occ    HPI:   [name] is a 86 y.o. female presenting today at the request of Rosalee Kaufman, [place] for rectal bleeding.   She has history of GERD, dysphagia, chronic nausea, and low-volume hematochezia in the setting of chronic anticoagulation. Colonoscopy in 2017 for chronic diarrhea which found a single tubular adenoma, otherwise normal exam, random colon biopsies benign. No recommendations to repeat colonoscopy. EGD with fundic gland polyps in 2017, otherwise normal exam s/p esophageal dilation.   Also with history of questionable porta hepatis lesion imaged with multiple modalities in the past year. MRI sorted this out and determined this to be a focal area of fat in the area of the falciform ligament. Her LFTs had been completely normal. In the latter part of 2018, she had alkaline phosphatase noted be almost 400 and AST of 46. Follow-up ultrasound demonstrated no hepatobiliary abnormalities to explain. No family history of liver disease per patient.  No new medications.   Reviewed labs: Most recent LFTs June 2021 within normal limits. Most recent CBC 11/08/2019 with hemoglobin 9.2 (L), down from 10 range in June 2021 and 12 range in mid to latter 2020. 11/09/2019- Ferritin 12, Iron 22 (L), Saturation 5% (L), Folate 13.1, B12 255.  Patient was admitted 09/19/2019-09/23/2019 with A. fib with RVR, acute on chronic heart failure.  Again admitted to the hospital 7/24-7/27/2021 for unstable angina.She underwent stress testing 11/09/19 that showed no ischemia or infarct. Given her persisent AF, plan was to attempt to restore NSR and see if her SOB iproves. TEE/DCCV performed 11/10/19 with no evidence of LAA thrombus. [facility] performed converted to NSR with clinical  improvement. Left ventricular ejection fraction, by estimation, is 60 to 65%. Left atrial size was moderately dilated, trivial MR, mild AR, mild aortic stenosis.  Follow-up with cardiology 11/18/2019.  She remained in regular rhythm.  Continue to have some chest discomfort and shortness of breath with ambulating.  With plans to continue current medications.  And follow-up in 3 months.  Today: Stools are dark. This started about 2 months ago, off and on. Not taking iron. Takes a muiltivatimin which she has taken for years. Sometimes stools can be formed, other times she has loose stools. Loose stool about 3 days a week. May have 3-4 BMs when she has loose stools.  Ice cream daily. Cheese and milk occasionally. Lower abdominal pain prior to BMs that improves after a BM. Will also have some pain that improves when she can pas gas.   Has external hemorrhoids that will bleed every time she has a BM. Feels burning/raw in her rectum. Using preparation H at night.  Typically she has a small about of blood on toilet tissue. May have 4-5 drops of blood in the toilet water.   No NSAIDs. Taking Protonix 40 mg daily. Started having a lot of GERD symptoms about 1 week ago. Having burning in her esophagus when eating.  Hurts when she swallows.  This is still occurring. Globus sensation. Feels foods get hung at the sternal notch daily. Started back in November 2020 and has been progressive. Occasional food regurgitation. Nausea with acid reflux. No significant upper abdominal pain. Just started taking pepto bismol the last 2 days which has helped her  GERD symptoms.   Has dizziness when changing positions. Chest pain daily. Comes on with anxiety and when up exerting herself. Chronic shortness of breath with minimal exertion. Some shortness of breath sitting still. Not on oxygen.  Chronic cough.   Past Medical History:  Diagnosis Date  . Anxiety   . Arthritis   . Atrial fibrillation ([place])   . Bursitis    Left  shoulder  . Cataract   . CHF (congestive heart failure) ([place])   . CKD (chronic kidney disease)    stage 3-4  . COPD (chronic obstructive pulmonary disease) ([place])   . Coronary atherosclerosis of native coronary artery    a. s/p DES to LCx in 04/2013 b. cath in 11/2015 showing patent stent with 20% prox-LAD and 80% ostial RCA stenosis for which medical management was recommended due to small artery size  . Depression   . Diastolic heart failure (HCC)    EF 55-60%  . Dysphagia, unspecified(787.20)   . Dyspnea   . Dysrhythmia   . Essential hypertension   . GERD (gastroesophageal reflux disease)    Hx Schatzki's ring, multiple EGD/ED last 01/06/2004  . Gout   . Headache   . History of anemia   . Hyperlipidemia   . Internal hemorrhoids without mention of complication   . MI (myocardial infarction) ([place]) 2006  . Microscopic colitis 2003  . Panic disorder without agoraphobia   . Paresthesia   . Pneumonia 12/2011  . PVD (peripheral vascular disease) ([place])   . S/P colonoscopy 09/27/2001   internal hemorrhoids, desc colon inflam polyp, SB BX-chronic duodenitis, colitis  . Thyroid disease     Past Surgical History:  Procedure Laterality Date  . ABDOMINAL HYSTERECTOMY    . ABDOMINAL HYSTERECTOMY    . ANTERIOR AND POSTERIOR REPAIR     with resection of vagina  . ANTERIOR LAT LUMBAR FUSION N/A 08/01/2016   Procedure: Lumbar Two-Lumbar Five Transpsoas lateral interbody fusion with Lumbar Two-Three lateral plate fixation;  Surgeon: Kevan Ny Ditty, MD;  Location: [place];  Service: Neurosurgery;  Laterality: N/A;  L2-5 Transpsoas lateral interbody fusion with L2-3 lateral plate fixation  . APPENDECTOMY    . BACK SURGERY    . BIOPSY  07/05/2015   Procedure: BIOPSY;  Surgeon: Daneil Dolin, MD;  [facility];  Service: Endoscopy;;  gastric polyp biopsy, ascending colon biopsy  . BLADDER SUSPENSION  11/09/2011   Procedure: TRANSVAGINAL TAPE (TVT) PROCEDURE;  Surgeon: Marissa Nestle, MD;  Location: [facility];  Service: Urology;  Laterality: N/A;  . bladder tack  06/2010  . BREAST LUMPECTOMY  1998   left, benign  . CARDIAC CATHETERIZATION    . CARDIAC CATHETERIZATION    . CARDIAC CATHETERIZATION N/A 12/16/2015   Procedure: Left Heart Cath and Coronary Angiography;  Surgeon: Troy Sine, MD;  Location: [place] CV LAB;  Service: Cardiovascular;  Laterality: N/A;  . CARDIOVERSION N/A 10/04/2017   Procedure: CARDIOVERSION;  Surgeon: Herminio Commons, MD;  Location: [facility];  Service: Cardiovascular;  Laterality: N/A;  . CARDIOVERSION N/A 01/30/2018   Procedure: CARDIOVERSION;  Surgeon: Herminio Commons, MD;  [facility];  Service: Cardiovascular;  Laterality: N/A;  . CARDIOVERSION N/A 11/10/2019   Procedure: CARDIOVERSION;  Surgeon: Josue Hector, MD;  Location: [place];  Service: Cardiovascular;  Laterality: N/A;  . [place]   left  . cataract surgery    . CHOLECYSTECTOMY  1998  . Cholecystectomy    .  COLONOSCOPY  03/16/2011   multiple hyperplastic colon polyps, sigmoid diverticulosis, melanosis coli  . COLONOSCOPY WITH PROPOFOL N/A 07/05/2015   RMR:one 5 mm polyp in descending colon  . CORONARY ANGIOGRAPHY N/A 05/16/2018   Procedure: CORONARY ANGIOGRAPHY (CATH LAB);  Surgeon: Belva Crome, MD;  Location: [place] [facility];  Service: Cardiovascular;  Laterality: N/A;  . CORONARY ANGIOPLASTY WITH STENT PLACEMENT    . ESOPHAGEAL DILATION N/A 07/05/2015   Procedure: ESOPHAGEAL DILATION;  Surgeon: Daneil Dolin, MD;  [facility];  Service: Endoscopy;  Laterality: N/A;  . ESOPHAGOGASTRODUODENOSCOPY (EGD) WITH PROPOFOL N/A 07/05/2015   ALP:[id number]  . JOINT REPLACEMENT Right 2007   right knee  . left hand surgery    . LEFT HEART CATHETERIZATION WITH CORONARY ANGIOGRAM N/A 05/14/2013   Procedure: LEFT HEART CATHETERIZATION WITH CORONARY ANGIOGRAM;  Surgeon: Blane Ohara, MD;  Location: [facility] CATH LAB;  Service:  Cardiovascular;  Laterality: N/A;  . left rotator cuff surgery    . LUMBAR LAMINECTOMY/DECOMPRESSION MICRODISCECTOMY N/A 10/11/2012   Procedure: LUMBAR LAMINECTOMY/DECOMPRESSION MICRODISCECTOMY 2 LEVELS;  Surgeon: Floyce Stakes, MD;  Location: [place] NEUR[facility] ORS;  Service: Neurosurgery;  Laterality: N/A;  L3-4 L4-5 Laminectomy  . LUMBAR WOUND DEBRIDEMENT N/A 09/27/2015   Procedure: Exploration of Lumbar Wound w/ Repair CSF Leak/Lumbar Drain Placement;  Surgeon: Leeroy Cha, MD;  Location: [place] NEURO ORS;  Service: Neurosurgery;  Laterality: N/A;  . MALONEY DILATION  03/16/2011   Gastritis. No H.pylori on bx. 15F maloney dilation with disruption of  occult cevical esophageal web  . NASAL SINUS SURGERY    . right knee replacement  2007  . right leg benign tumor    . SHOULDER SURGERY Left   . TEE WITHOUT CARDIOVERSION N/A 10/04/2017   Procedure: TRANSESOPHAGEAL ECHOCARDIOGRAM (TEE) WITH PROPOFOL;  Surgeon: Herminio Commons, MD;  [facility];  Service: Cardiovascular;  Laterality: N/A;  . TEE WITHOUT CARDIOVERSION N/A 11/10/2019   Procedure: TRANSESOPHAGEAL ECHOCARDIOGRAM (TEE);  Surgeon: Josue Hector, MD;  Location[facility]COPY;  Service: Cardiovascular;  Laterality: N/A;  . TONSILLECTOMY    . unspecified area, hysterectomy  1972   partial    Current Outpatient Medications  Medication Sig Dispense Refill  . acetaminophen (TYLENOL) 500 MG tablet Take 500 mg by mouth every 6 (six) hours as needed for headache.     . ALPRAZolam (XANAX) 0.5 MG tablet Take 0.5 mg by mouth as needed.     [name] Kitchen apixaban (ELIQUIS) 5 MG TABS tablet Take 1 tablet (5 mg total) by mouth 2 (two) times daily. 180 tablet 3  . diltiazem (CARDIZEM CD) 180 MG 24 hr capsule Take 1 capsule (180 mg total) by mouth daily. 30 capsule 1  . DULoxetine (CYMBALTA) 60 MG capsule Take 60 mg by mouth at bedtime.     . fluticasone (CUTIVATE) 0.05 % cream Apply 1 application topically 2 (two) times daily.     . fluticasone (FLONASE) 50  MCG/ACT nasal spray Place 2 sprays into both nostrils 2 (two) times daily as needed for allergies.     . furosemide (LASIX) 40 MG tablet Take 1 tablet (40 mg total) by mouth daily. 30 tablet 1  . ipratropium-albuterol (DUONEB) 0.5-2.5 (3) MG/3ML SOLN Take 3 mLs by nebulization every 6 (six) hours. 360 mL 0  . isosorbide mononitrate (IMDUR) 60 MG 24 hr tablet Take 1 tablet (60 mg total) by mouth daily. 90 tablet 3  . levothyroxine (SYNTHROID, LEVOTHROID) 25 MCG tablet Take 1 tablet by mouth  daily.     . magnesium oxide (MAG-OX) 400 MG tablet TAKE ONE TABLET BY MOUTH DAILY. 90 tablet 3  . methocarbamol (ROBAXIN) 500 MG tablet Take 500 mg by mouth every 6 (six) hours as needed for muscle spasms.     . metoprolol succinate (TOPROL-XL) 25 MG 24 hr tablet Take 25 mg by mouth daily. Takes with the 50mg  tablet to equal 75mg  total for the day.    . metoprolol succinate (TOPROL-XL) 50 MG 24 hr tablet Take 50 mg by mouth daily. Takes with the 25mg  tablet to equal 75mg  total for the day.    . Multiple Vitamin (MULTIVITAMIN WITH MINERALS) TABS tablet Take 1 tablet by mouth daily. Centrum     . nitroGLYCERIN (NITROSTAT) 0.4 MG SL tablet Place 1 tablet (0.4 mg total) under the tongue every 5 (five) minutes as needed for chest pain. Reported on 08/04/2015 25 tablet 6  . potassium chloride (KLOR-CON) 10 MEQ tablet Take 1 tablet (10 mEq total) by mouth daily. 180 tablet 1  . sodium chloride (OCEAN) 0.65 % SOLN nasal spray Place 1 spray into both nostrils as needed (allergies).     . traMADol (ULTRAM) 50 MG tablet Take 50 mg by mouth every 6 (six) hours as needed for pain.    . hydrocortisone (ANUSOL-HC) 2.5 % rectal cream Place 1 application rectally 2 (two) times daily. 30 g 1  . pantoprazole (PROTONIX) 40 MG tablet Take 1 tablet (40 mg total) by mouth 2 (two) times daily before a meal. 60 tablet 3  . sucralfate (CARAFATE) 1 GM/10ML suspension Take 10 mLs (1 g total) by mouth 4 (four) times daily -  with meals and at  bedtime. 420 mL 2   No current facility-administered medications for this visit.    Allergies as of 12/12/2019 - Review Complete 12/12/2019  Allergen Reaction Noted  . Cephalosporins Diarrhea and Nausea Only 08/04/2015  . Levaquin [levofloxacin in d5w] Swelling 07/06/2014  . Macrodantin [nitrofurantoin macrocrystal] Swelling 07/06/2014  . Phenothiazines Anaphylaxis and Hives 08/16/2011  . Polysorbate Anaphylaxis 08/16/2011  . Prednisone Shortness Of Breath   . Buspirone Itching 07/06/2014  . Cardura [doxazosin mesylate] Itching 07/06/2014  . Codeine Itching   . Acyclovir and related Itching 10/07/2015  . Colcrys [colchicine] Nausea Only 10/04/2018  . Prochlorperazine Other (See Comments) 08/16/2011  . Ranexa [ranolazine]  04/06/2015  . Atorvastatin Hives 08/16/2011  . Ofloxacin Rash   . Other Itching and Rash 10/10/2012  . Penicillins Other (See Comments) 11/09/2011  . Pimozide Hives and Itching 08/16/2011    Family History  Problem Relation Age of Onset  . Stroke Mother   . Parkinson's disease Father   . Coronary artery disease Other        family Hx-sons  . Cancer Other   . Stroke Other        family Hx  . Hypertension Other        family Hx  . Diabetes Brother   . Heart disease Son        before age 47  . Diabetes Son   . Stroke Daughter 78  . Colon cancer Neg Hx     Social History   Socioeconomic History  . Marital status: Divorced    Spouse name: Not on file  . Number of children: 5  . Years of education: Not on file  . Highest education level: Not on file  Occupational History  . Occupation: retired  Tobacco Use  . Smoking status: Former Smoker  Packs/day: 1.00    Years: 64.00    Pack years: 64.00    Types: Cigarettes    Start date: 12/24/1947    Quit date: 11/17/2001    Years since quitting: 18.0  . Smokeless tobacco: Never Used  . Tobacco comment: Quit smoking in 2003  Vaping Use  . Vaping Use: Never used  Substance and Sexual Activity  .  Alcohol use: Yes    Alcohol/week: 0.0 standard drinks    Comment: only at new years.   . Drug use: No  . Sexual activity: Never  Other Topics Concern  . Not on file  Social History Narrative   Divorced.   Sister had colon perforation & died from complications in [place], [place]   Social Determinants of Health   Financial Resource Strain:   . Difficulty of Paying Living Expenses: Not on file  Food Insecurity:   . Worried About Charity fundraiser in the Last Year: Not on file  . Ran Out of Food in the Last Year: Not on file  Transportation Needs:   . Lack of Transportation (Medical): Not on file  . Lack of Transportation (Non-Medical): Not on file  Physical Activity:   . Days of Exercise per Week: Not on file  . Minutes of Exercise per Session: Not on file  Stress:   . Feeling of Stress : Not on file  Social Connections:   . Frequency of Communication with Friends and Family: Not on file  . Frequency of Social Gatherings with Friends and Family: Not on file  . Attends Religious Services: Not on file  . Active Member of Clubs or Organizations: Not on file  . Attends Archivist Meetings: Not on file  . Marital Status: Not on file    Review of Systems: Gen: Denies fever, chills, cold or flulike symptoms. CV: See HPI Resp: See HPI GI: See HPI Derm: Reports having a "rash" on her bottom.  States it is doing much better.  She has been following with her PCP and has creams to apply to this. Heme: See HPI  Physical Exam: BP (!) 115/59   Pulse (!) 52   Temp (!) 97.1 F (36.2 C) (Temporal)   Ht 5\' 1"  (1.549 m)   Wt 157 lb 12.8 oz (71.6 kg)   BMI 29.82 kg/m  General:   Alert and oriented. No distress noted. Pleasant and cooperative.  Head:  Normocephalic and atraumatic. Eyes:  Conjuctiva clear without scleral icterus. Heart:  S1, S2 present without murmurs appreciated. Lungs:  Clear to auscultation bilaterally. No wheezes, rales, or rhonchi. No distress.  Abdomen:   +BS, soft, non-tender and non-distended. No rebound or guarding. No HSM or masses noted. Rectal: External hemorrhoids and skin tags noted.  Also with what appears to be a stage II pressure sore on her left buttock that is about 3.5 inches x 1.5 inches.  Appears dry and is not oozing.  No tenderness to the area.  No significant erythema around the area.  Internal exam with some decreased sphincter tone.  ?  Small internal hemorrhoids. Msk:  Symmetrical without gross deformities. Normal posture. Extremities:  Without edema. Neurologic:  Alert and  oriented x4 Psych: Normal mood and affect.

## 2019-12-12 ENCOUNTER — Other Ambulatory Visit (HOSPITAL_COMMUNITY)
Admission: RE | Admit: 2019-12-12 | Discharge: 2019-12-12 | Disposition: A | Discharge status: Home / Self Care | Payer: Medicare HMO | Source: Ambulatory Visit | Attending: Gastroenterology | Admitting: Gastroenterology

## 2019-12-12 ENCOUNTER — Encounter: Payer: Self-pay | Admitting: Gastroenterology

## 2019-12-12 ENCOUNTER — Ambulatory Visit: Payer: Medicare HMO | Admitting: Gastroenterology

## 2019-12-12 ENCOUNTER — Telehealth: Payer: Self-pay | Admitting: Gastroenterology

## 2019-12-12 ENCOUNTER — Other Ambulatory Visit: Payer: Self-pay

## 2019-12-12 VITALS — BP 115/59 | HR 52 | Temp 97.1°F | Ht 61.0 in | Wt 157.8 lb

## 2019-12-12 DIAGNOSIS — K529 Noninfective gastroenteritis and colitis, unspecified: Secondary | ICD-10-CM

## 2019-12-12 DIAGNOSIS — K625 Hemorrhage of anus and rectum: Secondary | ICD-10-CM

## 2019-12-12 DIAGNOSIS — R1310 Dysphagia, unspecified: Secondary | ICD-10-CM | POA: Diagnosis not present

## 2019-12-12 DIAGNOSIS — D649 Anemia, unspecified: Secondary | ICD-10-CM | POA: Insufficient documentation

## 2019-12-12 DIAGNOSIS — K219 Gastro-esophageal reflux disease without esophagitis: Secondary | ICD-10-CM

## 2019-12-12 DIAGNOSIS — R195 Other fecal abnormalities: Secondary | ICD-10-CM | POA: Diagnosis not present

## 2019-12-12 LAB — CBC WITH DIFFERENTIAL/PLATELET
Abs Immature Granulocytes: 0.13 10*3/uL — ABNORMAL HIGH (ref 0.00–0.07)
Basophils Absolute: 0.1 10*3/uL (ref 0.0–0.1)
Basophils Relative: 1 %
Eosinophils Absolute: 0.2 10*3/uL (ref 0.0–0.5)
Eosinophils Relative: 2 %
HCT: 32.4 % — ABNORMAL LOW (ref 36.0–46.0)
Hemoglobin: 9.6 g/dL — ABNORMAL LOW (ref 12.0–15.0)
Immature Granulocytes: 1 %
Lymphocytes Relative: 19 %
Lymphs Abs: 2.2 10*3/uL (ref 0.7–4.0)
MCH: 22.6 pg — ABNORMAL LOW (ref 26.0–34.0)
MCHC: 29.6 g/dL — ABNORMAL LOW (ref 30.0–36.0)
MCV: 76.2 fL — ABNORMAL LOW (ref 80.0–100.0)
Monocytes Absolute: 0.8 10*3/uL (ref 0.1–1.0)
Monocytes Relative: 7 %
Neutro Abs: 8.4 10*3/uL — ABNORMAL HIGH (ref 1.7–7.7)
Neutrophils Relative %: 70 %
Platelets: 574 10*3/uL — ABNORMAL HIGH (ref 150–400)
RBC: 4.25 MIL/uL (ref 3.87–5.11)
RDW: 15.5 % (ref 11.5–15.5)
WBC: 11.8 10*3/uL — ABNORMAL HIGH (ref 4.0–10.5)
nRBC: 0 % (ref 0.0–0.2)

## 2019-12-12 LAB — IRON AND TIBC
Iron: 21 ug/dL — ABNORMAL LOW (ref 28–170)
Saturation Ratios: 4 % — ABNORMAL LOW (ref 10.4–31.8)
TIBC: 517 ug/dL — ABNORMAL HIGH (ref 250–450)
UIBC: 496 ug/dL

## 2019-12-12 LAB — FERRITIN: Ferritin: 12 ng/mL (ref 11–307)

## 2019-12-12 MED ORDER — HYDROCORTISONE (PERIANAL) 2.5 % EX CREA: 1.0000 "application " | TOPICAL_CREAM | Freq: Two times a day (BID) | CUTANEOUS | 1 refills | Status: AC

## 2019-12-12 MED ORDER — SUCRALFATE 1 GM/10ML PO SUSP: 1.0000 g | Freq: Three times a day (TID) | ORAL | 2 refills | Status: DC

## 2019-12-12 MED ORDER — PANTOPRAZOLE SODIUM 40 MG PO TBEC: 40.0000 mg | DELAYED_RELEASE_TABLET | Freq: Two times a day (BID) | ORAL | 3 refills | Status: DC

## 2019-12-12 NOTE — Patient Instructions (Addendum)
Please have labs completed.   We will get you scheduled for an upper endoscopy with possible dilation of your esophagus in the near future with Dr. Gala Romney.   Please increase Protonix to 40 mg twice daily 30 minutes before breakfast and dinner.   Please start Carafate 3 times daily before meals and at bedtime.   Follow a GERD diet:  Avoid fried, fatty, greasy, spicy, citrus foods. Avoid caffeine and carbonated beverages. Avoid chocolate. Try eating 4-6 small meals a day rather than 3 large meals. Do not eat within 3 hours of laying down. Prop head of bed up on wood or bricks to create a 6 inch incline.  Follow a softer diet for now. Continue to chop all meats finely. Eat slowly, chew thoroughly, and have plenty of liquids throughout your meals.   For rectal bleeding and hemnorrhoids: Start Anusol cream to your rectum 2 times daily for the next 10 days.  Be sure to avoid straining.   For intermittent diarrhea, I recommend you follow a lactose free diet for now.   We will plan to see you back after your procedure. Should you have any worsening symptoms, feeling significnatly weak, like you will pass out, profuse rectal bleeding, pitch black stools, or if something gets hung in your esophgus and will not come up or go down, you should proceed to the emergency room.   Aliene Altes, PA-C [facility]

## 2019-12-12 NOTE — Telephone Encounter (Signed)
[name], I did not write on patient's encounter form that I need her to hold Eliquis for 48 hours prior to her EGD +/- dilation. I have updated the clinical pool.  We need to get the ok from cardiology to hold Eliquis. I would also like to get cardiac clearance to proceed with procedures considering her cardiac history.

## 2019-12-14 NOTE — Assessment & Plan Note (Addendum)
Over the last year, patient has had decline in her hemoglobin.  Hemoglobin in the 12 range in mid to latter 2020, 10 range in June 2021, and down to 9.2 on 11/08/2019.  Additional labs in July with ferritin 12, iron 22 (L), saturation 5% (L), folate 13.1, B12 255.  She is chronically anticoagulated on Eliquis.  Chronic history of low-volume bright red blood per rectum with hemorrhoids.  She now notes 2 months of intermittent dark/black stools.  She is not taking iron.  She did take Pepto-Bismol a couple of times, but this was well after her stools became dark.  Additional upper GI symptoms include uncontrolled GERD x1 week, dysphagia, and odynophagia.  She has been maintained on daily PPI for quite some time.  No NSAIDs.  No significant abdominal pain. Chronic intermittent diarrhea that is unchanged and likely secondary to lactose intolerance. Last colonoscopy in 2017 with 1 tubular adenoma, otherwise normal exam, random colon biopsies benign.  EGD in 2017 with fundic gland polyps, otherwise normal s/p dilation.  Concern for upper GI source for her declining hemoglobin.  May have bleeding from fundic gland polyps, AVMs, less likely gastritis, duodenitis, or PUD with a daily PPI.  Cannot rule out H. pylori or malignancy. With chronic bright red blood per rectum and known hemorrhoids, I am somewhat less suspicious for lower GI etiology; however, with colonoscopy 4 years ago cannot rule this out.  Discussed the possibility of colonoscopy and upper endoscopy with patient.  She does not want colonoscopy but is agreeable to EGD.  Plan: Update CBC and iron panel. Increase Protonix to 40 mg twice daily 30 minutes before breakfast and dinner. Carafate 1 g 3 times daily before meals and at bedtime. Anusol cream twice daily x7 days for rectal bleeding. Hope to proceed with EGD +/-dilation with propofol with Dr. Gala Romney in the near future.  We will need to reach out to cardiology to obtain clearance due to cardiac  history, ongoing angina, recent hospitalizations, and to get the okay to hold Eliquis for 48 hours prior to procedure. ASA IV Plan to follow-up after EGD.

## 2019-12-14 NOTE — Assessment & Plan Note (Addendum)
Patient reports sensation of food getting hung at the sternal notch that began in November 2020 but have been progressive.  Occasionally, this requires food regurgitation.  Also with chronic history of GERD that has become uncontrolled over the last week and odynophagia noting burning in her esophagus and pain with swallowing.   Patient is going to need EGD for further evaluation.  Dysphagia may be secondary to esophageal web, ring, or stricture.  Cannot rule out malignancy.  Odynophagia may be secondary to uncontrolled GERD with esophagitis versus esophageal candidiasis or HSV.  Plan: Hope to proceed with EGD +/-dilation with propofol with Dr. Gala Romney in the near future.  We will need to reach out to cardiology to obtain clearance due to cardiac history, ongoing angina, recent hospitalizations, and to get the okay to hold Eliquis for 48 hours prior to procedure. ASA IV Increase Protonix to 40 mg twice daily 30 minutes before breakfast and dinner. Start Carafate 1 g 3 times daily before meals and at bedtime. Counseled on GERD diet/lifestyle. Follow a softer diet.  All meats should be chopped finely or ground.  Eat slowly, take small bites, chew thoroughly, and drink plenty of liquids throughout meals. Plan to follow-up after EGD.

## 2019-12-14 NOTE — Assessment & Plan Note (Signed)
Addressed under dysphagia. 

## 2019-12-14 NOTE — Assessment & Plan Note (Addendum)
Chronic history of GERD.  Symptoms are not adequately controlled.  She reports significant breakthrough reflux symptoms on Protonix daily x1 week.  Also with odynophagia with burning in her esophagus and pain with swallowing.  Intermittent dysphagia has been present since November 2020 and has been progressive, occasionally requiring food regurgitation.  No significant upper abdominal pain but she does note 2 months of intermittent dark stools.  Per review of labs, hemoglobin has been trending down.  Hemoglobin in the 12 range in mid to latter 2020, 10 range in June 2021, and 9.2 in July 2021.  Plan: Increase Protonix to 40 mg twice daily 30 minutes before breakfast and dinner. Add Carafate 1 g 3 times daily before meals and at bedtime. Counseled on GERD diet/lifestyle. Hope to proceed with EGD +/-dilation with propofol with Dr. Gala Romney in the near future.  We will need to reach out to cardiology to obtain clearance due to cardiac history, ongoing angina, recent hospitalizations, and to get the okay to hold Eliquis for 48 hours prior to procedure. ASA IV Plan to follow-up after EGD.

## 2019-12-14 NOTE — Progress Notes (Signed)
Hemoglobin 9.6. This is low but stable compared to 1 month ago. Iron panel is low as well.   We are waiting to hear back from cardiology regarding clearance for EGD.   Recommendations:  She will need to start oral iron in the near future. We will hold off on this for now until we know when she will be able to have an EGD.

## 2019-12-14 NOTE — Assessment & Plan Note (Signed)
Chronic intermittent diarrhea.  Currently with loose BMs about 3 days a week.  Suspect lactose intolerance is likely contributing as patient is eating ice cream every night along with cheese and milk intermittently.  Random colon biopsies during colonoscopy in 2017 benign.    Recommended she follow lactose-free diet for now to see if this resolves diarrhea.

## 2019-12-14 NOTE — Assessment & Plan Note (Addendum)
Chronic history of rectal bleeding in the setting of hemorrhoids and chronic anticoagulation.  Patient reports low-volume rectal bleeding daily with bowel movements.  States she has some burning and raw sensation in her rectum.  Bowels fluctuate from regular BMs daily to diarrhea.  Suspect diarrhea is likely secondary to lactose intolerance. Colonoscopy in 2017 with single tubular adenoma, otherwise normal exam, random colon biopsies benign.  Of note, patient's hemoglobin has been trending down.  Hemoglobin 9.2 in July 2021.  This is down from 10 range in June 2021 and 12 range in mid to latter 2020.  Additional labs in July 2021 with ferritin 12, iron 22, saturation 5%, folate 13.1, B12 255.  Discussed the possibility of a colonoscopy with patient today.  She prefers not to have colonoscopy at this time.   Plan: Update CBC and iron panel Anusol cream twice daily per rectum for 10 days then as needed. Avoid straining. Follow lactose-free diet to prevent diarrhea.

## 2019-12-14 NOTE — Assessment & Plan Note (Signed)
Addressed under anemia.

## 2019-12-15 ENCOUNTER — Telehealth: Payer: Self-pay

## 2019-12-15 NOTE — Telephone Encounter (Signed)
Noted. Routing to Aliene Altes, [place] and [facility] Clinical Pool.

## 2019-12-15 NOTE — Telephone Encounter (Signed)
Message sent to cardiologist. Waiting on an approval.

## 2019-12-15 NOTE — Telephone Encounter (Signed)
Chaya Jan, NP, Aliene Altes PA saw pt Friday 12/12/19. Pt is due for an upper endoscopy. Is it ok for pt to hold Eliquis 48 hours prior to her procedure? Aliene Altes, PA would also like cardiac clearance for pt to proceed with procedure. Please advise.

## 2019-12-15 NOTE — Telephone Encounter (Signed)
Given recent stress test and echocardiogram within normal limits and recent conversion to normal sinus rhythm.  She is okay to undergo an EGD in May hold Eliquis for the requested period of time.  Thank you

## 2019-12-20 NOTE — Telephone Encounter (Signed)
Noted. Ok to proceed with EGD +/- dilation and hold Eliquis x 48 hours prior to procedure.

## 2019-12-23 NOTE — Telephone Encounter (Signed)
Will call patient once we receive November schedule

## 2019-12-24 ENCOUNTER — Encounter: Payer: Self-pay | Admitting: *Deleted

## 2019-12-24 NOTE — Telephone Encounter (Signed)
Called pt and she has been scheduled for procedure 11/1 at 12:45pm. Patient aware needs pre-op/covid test prior. Advised will mail with instructions. Confirmed mailing address.

## 2019-12-31 DIAGNOSIS — J449 Chronic obstructive pulmonary disease, unspecified: Secondary | ICD-10-CM | POA: Diagnosis not present

## 2020-01-06 DIAGNOSIS — M109 Gout, unspecified: Secondary | ICD-10-CM | POA: Diagnosis not present

## 2020-01-06 DIAGNOSIS — D649 Anemia, unspecified: Secondary | ICD-10-CM | POA: Diagnosis not present

## 2020-01-06 DIAGNOSIS — L0390 Cellulitis, unspecified: Secondary | ICD-10-CM | POA: Diagnosis not present

## 2020-01-07 DIAGNOSIS — Z885 Allergy status to narcotic agent status: Secondary | ICD-10-CM | POA: Diagnosis not present

## 2020-01-07 DIAGNOSIS — I130 Hypertensive heart and chronic kidney disease with heart failure and stage 1 through stage 4 chronic kidney disease, or unspecified chronic kidney disease: Secondary | ICD-10-CM | POA: Diagnosis not present

## 2020-01-07 DIAGNOSIS — Z881 Allergy status to other antibiotic agents status: Secondary | ICD-10-CM | POA: Diagnosis not present

## 2020-01-07 DIAGNOSIS — Z9049 Acquired absence of other specified parts of digestive tract: Secondary | ICD-10-CM | POA: Diagnosis not present

## 2020-01-07 DIAGNOSIS — N189 Chronic kidney disease, unspecified: Secondary | ICD-10-CM | POA: Diagnosis not present

## 2020-01-07 DIAGNOSIS — L89322 Pressure ulcer of left buttock, stage 2: Secondary | ICD-10-CM | POA: Diagnosis not present

## 2020-01-07 DIAGNOSIS — I509 Heart failure, unspecified: Secondary | ICD-10-CM | POA: Diagnosis not present

## 2020-01-07 DIAGNOSIS — J449 Chronic obstructive pulmonary disease, unspecified: Secondary | ICD-10-CM | POA: Diagnosis not present

## 2020-01-07 DIAGNOSIS — Z90710 Acquired absence of both cervix and uterus: Secondary | ICD-10-CM | POA: Diagnosis not present

## 2020-01-07 DIAGNOSIS — L89329 Pressure ulcer of left buttock, unspecified stage: Secondary | ICD-10-CM | POA: Diagnosis not present

## 2020-01-13 DIAGNOSIS — I130 Hypertensive heart and chronic kidney disease with heart failure and stage 1 through stage 4 chronic kidney disease, or unspecified chronic kidney disease: Secondary | ICD-10-CM | POA: Diagnosis not present

## 2020-01-13 DIAGNOSIS — I5033 Acute on chronic diastolic (congestive) heart failure: Secondary | ICD-10-CM | POA: Diagnosis not present

## 2020-01-13 DIAGNOSIS — I482 Chronic atrial fibrillation, unspecified: Secondary | ICD-10-CM | POA: Diagnosis not present

## 2020-01-13 DIAGNOSIS — G5600 Carpal tunnel syndrome, unspecified upper limb: Secondary | ICD-10-CM | POA: Diagnosis not present

## 2020-01-13 DIAGNOSIS — M7062 Trochanteric bursitis, left hip: Secondary | ICD-10-CM | POA: Diagnosis not present

## 2020-01-13 DIAGNOSIS — R69 Illness, unspecified: Secondary | ICD-10-CM | POA: Diagnosis not present

## 2020-01-13 DIAGNOSIS — I739 Peripheral vascular disease, unspecified: Secondary | ICD-10-CM | POA: Diagnosis not present

## 2020-01-13 DIAGNOSIS — L89893 Pressure ulcer of other site, stage 3: Secondary | ICD-10-CM | POA: Diagnosis not present

## 2020-01-13 DIAGNOSIS — J449 Chronic obstructive pulmonary disease, unspecified: Secondary | ICD-10-CM | POA: Diagnosis not present

## 2020-01-13 DIAGNOSIS — I2510 Atherosclerotic heart disease of native coronary artery without angina pectoris: Secondary | ICD-10-CM | POA: Diagnosis not present

## 2020-01-13 DIAGNOSIS — N183 Chronic kidney disease, stage 3 unspecified: Secondary | ICD-10-CM | POA: Diagnosis not present

## 2020-01-15 DIAGNOSIS — I130 Hypertensive heart and chronic kidney disease with heart failure and stage 1 through stage 4 chronic kidney disease, or unspecified chronic kidney disease: Secondary | ICD-10-CM | POA: Diagnosis not present

## 2020-01-15 DIAGNOSIS — J440 Chronic obstructive pulmonary disease with acute lower respiratory infection: Secondary | ICD-10-CM | POA: Diagnosis not present

## 2020-01-15 DIAGNOSIS — N1832 Chronic kidney disease, stage 3b: Secondary | ICD-10-CM | POA: Diagnosis not present

## 2020-01-15 DIAGNOSIS — Z87891 Personal history of nicotine dependence: Secondary | ICD-10-CM | POA: Diagnosis not present

## 2020-01-15 DIAGNOSIS — I5033 Acute on chronic diastolic (congestive) heart failure: Secondary | ICD-10-CM | POA: Diagnosis not present

## 2020-01-19 DIAGNOSIS — I130 Hypertensive heart and chronic kidney disease with heart failure and stage 1 through stage 4 chronic kidney disease, or unspecified chronic kidney disease: Secondary | ICD-10-CM | POA: Diagnosis not present

## 2020-01-19 DIAGNOSIS — R69 Illness, unspecified: Secondary | ICD-10-CM | POA: Diagnosis not present

## 2020-01-19 DIAGNOSIS — J449 Chronic obstructive pulmonary disease, unspecified: Secondary | ICD-10-CM | POA: Diagnosis not present

## 2020-01-19 DIAGNOSIS — I2510 Atherosclerotic heart disease of native coronary artery without angina pectoris: Secondary | ICD-10-CM | POA: Diagnosis not present

## 2020-01-19 DIAGNOSIS — N183 Chronic kidney disease, stage 3 unspecified: Secondary | ICD-10-CM | POA: Diagnosis not present

## 2020-01-19 DIAGNOSIS — G5600 Carpal tunnel syndrome, unspecified upper limb: Secondary | ICD-10-CM | POA: Diagnosis not present

## 2020-01-19 DIAGNOSIS — I482 Chronic atrial fibrillation, unspecified: Secondary | ICD-10-CM | POA: Diagnosis not present

## 2020-01-19 DIAGNOSIS — L89893 Pressure ulcer of other site, stage 3: Secondary | ICD-10-CM | POA: Diagnosis not present

## 2020-01-19 DIAGNOSIS — I5033 Acute on chronic diastolic (congestive) heart failure: Secondary | ICD-10-CM | POA: Diagnosis not present

## 2020-01-19 DIAGNOSIS — I739 Peripheral vascular disease, unspecified: Secondary | ICD-10-CM | POA: Diagnosis not present

## 2020-01-27 DIAGNOSIS — I739 Peripheral vascular disease, unspecified: Secondary | ICD-10-CM | POA: Diagnosis not present

## 2020-01-27 DIAGNOSIS — I5033 Acute on chronic diastolic (congestive) heart failure: Secondary | ICD-10-CM | POA: Diagnosis not present

## 2020-01-27 DIAGNOSIS — I130 Hypertensive heart and chronic kidney disease with heart failure and stage 1 through stage 4 chronic kidney disease, or unspecified chronic kidney disease: Secondary | ICD-10-CM | POA: Diagnosis not present

## 2020-01-27 DIAGNOSIS — J449 Chronic obstructive pulmonary disease, unspecified: Secondary | ICD-10-CM | POA: Diagnosis not present

## 2020-01-27 DIAGNOSIS — I482 Chronic atrial fibrillation, unspecified: Secondary | ICD-10-CM | POA: Diagnosis not present

## 2020-01-27 DIAGNOSIS — I2510 Atherosclerotic heart disease of native coronary artery without angina pectoris: Secondary | ICD-10-CM | POA: Diagnosis not present

## 2020-01-27 DIAGNOSIS — N183 Chronic kidney disease, stage 3 unspecified: Secondary | ICD-10-CM | POA: Diagnosis not present

## 2020-01-27 DIAGNOSIS — L89893 Pressure ulcer of other site, stage 3: Secondary | ICD-10-CM | POA: Diagnosis not present

## 2020-01-27 DIAGNOSIS — R69 Illness, unspecified: Secondary | ICD-10-CM | POA: Diagnosis not present

## 2020-01-27 DIAGNOSIS — G5600 Carpal tunnel syndrome, unspecified upper limb: Secondary | ICD-10-CM | POA: Diagnosis not present

## 2020-01-30 DIAGNOSIS — J449 Chronic obstructive pulmonary disease, unspecified: Secondary | ICD-10-CM | POA: Diagnosis not present

## 2020-02-04 DIAGNOSIS — L89322 Pressure ulcer of left buttock, stage 2: Secondary | ICD-10-CM | POA: Diagnosis not present

## 2020-02-06 DIAGNOSIS — I130 Hypertensive heart and chronic kidney disease with heart failure and stage 1 through stage 4 chronic kidney disease, or unspecified chronic kidney disease: Secondary | ICD-10-CM | POA: Diagnosis not present

## 2020-02-06 DIAGNOSIS — G5600 Carpal tunnel syndrome, unspecified upper limb: Secondary | ICD-10-CM | POA: Diagnosis not present

## 2020-02-06 DIAGNOSIS — I739 Peripheral vascular disease, unspecified: Secondary | ICD-10-CM | POA: Diagnosis not present

## 2020-02-06 DIAGNOSIS — I5033 Acute on chronic diastolic (congestive) heart failure: Secondary | ICD-10-CM | POA: Diagnosis not present

## 2020-02-06 DIAGNOSIS — L89893 Pressure ulcer of other site, stage 3: Secondary | ICD-10-CM | POA: Diagnosis not present

## 2020-02-06 DIAGNOSIS — I482 Chronic atrial fibrillation, unspecified: Secondary | ICD-10-CM | POA: Diagnosis not present

## 2020-02-06 DIAGNOSIS — L89329 Pressure ulcer of left buttock, unspecified stage: Secondary | ICD-10-CM | POA: Diagnosis not present

## 2020-02-06 DIAGNOSIS — R69 Illness, unspecified: Secondary | ICD-10-CM | POA: Diagnosis not present

## 2020-02-06 DIAGNOSIS — N183 Chronic kidney disease, stage 3 unspecified: Secondary | ICD-10-CM | POA: Diagnosis not present

## 2020-02-06 DIAGNOSIS — I2510 Atherosclerotic heart disease of native coronary artery without angina pectoris: Secondary | ICD-10-CM | POA: Diagnosis not present

## 2020-02-06 DIAGNOSIS — J449 Chronic obstructive pulmonary disease, unspecified: Secondary | ICD-10-CM | POA: Diagnosis not present

## 2020-02-08 DIAGNOSIS — I4891 Unspecified atrial fibrillation: Secondary | ICD-10-CM | POA: Diagnosis not present

## 2020-02-08 DIAGNOSIS — I110 Hypertensive heart disease with heart failure: Secondary | ICD-10-CM | POA: Diagnosis not present

## 2020-02-08 DIAGNOSIS — R9431 Abnormal electrocardiogram [ECG] [EKG]: Secondary | ICD-10-CM | POA: Diagnosis not present

## 2020-02-08 DIAGNOSIS — R42 Dizziness and giddiness: Secondary | ICD-10-CM | POA: Diagnosis not present

## 2020-02-08 DIAGNOSIS — Z7901 Long term (current) use of anticoagulants: Secondary | ICD-10-CM | POA: Diagnosis not present

## 2020-02-08 DIAGNOSIS — I5032 Chronic diastolic (congestive) heart failure: Secondary | ICD-10-CM | POA: Diagnosis not present

## 2020-02-08 DIAGNOSIS — D649 Anemia, unspecified: Secondary | ICD-10-CM | POA: Diagnosis not present

## 2020-02-08 DIAGNOSIS — R069 Unspecified abnormalities of breathing: Secondary | ICD-10-CM | POA: Diagnosis not present

## 2020-02-08 DIAGNOSIS — Z79899 Other long term (current) drug therapy: Secondary | ICD-10-CM | POA: Diagnosis not present

## 2020-02-08 DIAGNOSIS — J811 Chronic pulmonary edema: Secondary | ICD-10-CM | POA: Diagnosis not present

## 2020-02-08 DIAGNOSIS — R0602 Shortness of breath: Secondary | ICD-10-CM | POA: Diagnosis not present

## 2020-02-08 DIAGNOSIS — R4702 Dysphasia: Secondary | ICD-10-CM | POA: Diagnosis not present

## 2020-02-08 DIAGNOSIS — I700 Atherosclerosis of aorta: Secondary | ICD-10-CM | POA: Diagnosis not present

## 2020-02-08 DIAGNOSIS — I252 Old myocardial infarction: Secondary | ICD-10-CM | POA: Diagnosis not present

## 2020-02-08 DIAGNOSIS — K219 Gastro-esophageal reflux disease without esophagitis: Secondary | ICD-10-CM | POA: Diagnosis not present

## 2020-02-08 DIAGNOSIS — Z20822 Contact with and (suspected) exposure to covid-19: Secondary | ICD-10-CM | POA: Diagnosis not present

## 2020-02-08 DIAGNOSIS — E785 Hyperlipidemia, unspecified: Secondary | ICD-10-CM | POA: Diagnosis not present

## 2020-02-09 ENCOUNTER — Telehealth: Payer: Self-pay | Admitting: Internal Medicine

## 2020-02-09 NOTE — Telephone Encounter (Signed)
Spoke with Dr. Milagros Reap, ED physician at [facility]. Patient presented with symptomatic anemia last night with hemoglobin 6.7, no overt GI bleeding while she was there, and stool was heme negative. She received 2 units PRBCs and hemoglobin is up to 11.1. She is being discharged home.   Updated Dr. Gala Romney who is agreeable with proceeding with EGD on 11/1 as planned.   Elmo Putt, please let patient know that we can proceed with EGD as planned on 11/1. I spoke with Dr. Milagros Reap at [facility] and she is stable after receiving 2 units of blood. Hemoglobin improved to 11.1 from 6.7. Also, the appointment she had with cardiology was not specifically at our request. We had already received cardiac clearance and the ok to hold Eliquis x48 hours prior to procedure.

## 2020-02-09 NOTE — Telephone Encounter (Signed)
Spoke with pts daughter. Pt is currently at [facility] for rectal bleeding. Pt has been seen but not admitted yet due to bed shortage. Pt has been units of Blood (last night and around 2AM). Pt went to the hospital last night around 6:30 PM. Pt's daughter is trying to get pt transferred to [facility] but isn't sure if this is possible. Pts scheduled for an EGD 02/16/20. Pt is suppose to have pre-op labs Thursday 02/12/20. Pt's daughter isn't sure if pt is able to do any of the procedures right now or should they hold off a few days before cancelling pts apt? Please advise.

## 2020-02-09 NOTE — Telephone Encounter (Signed)
Spoke with pts daughter. She was notified that Aliene Altes, [place] has spoke with the ED doctor and that pt can proceed with EGD as directed. Dr. Gala Romney is aware of this per Aliene Altes, [place]. Pts daughter was notified of times that pt will have covid testing and procedure date with time.

## 2020-02-09 NOTE — Telephone Encounter (Signed)
Pt's daughter, Manuela Schwartz, called to let us know that patient is at [facility] and she is wanting patient transferred to [facility]. Pt is scheduled EGD w/dilation with Dr Gala Romney on 11/1 and she has questions about if she should cancel procedure or not. Pt is on Eliquis and cancelled her heart doctor's appointment. I told her to let us know if patient does get transferred to [facility]. Please call daughter at [phone]

## 2020-02-09 NOTE — Telephone Encounter (Signed)
[facility] called to say that Dr Sharia Reeve wanted to speak with Dr Gala Romney regarding patient. I sent Dr Gala Romney a page to call.

## 2020-02-10 ENCOUNTER — Ambulatory Visit: Payer: Medicare HMO | Admitting: Cardiology

## 2020-02-10 NOTE — Patient Instructions (Signed)
[name]  02/10/2020     @PREFPERIOPPHARMACY @   Your procedure is scheduled on  02/16/2020.  Report to [name] at  1115  A.M.  Call this number if you have problems the morning of surgery:  [phone]   Remember:  Follow the diet instructions given to you by the office.    Take these medicines the morning of surgery with A SIP OF WATER  Xanax(if needed), diltiazem, cymbalta, isosorbide, levothyroxine, metoprolol, protonix, tamadol(if needed). Use your nebulizer before you come.  YOUR LAST DOSE OF ELIQUIS SHOULD BE ON 02/13/2020.     Do not wear jewelry, make-up or nail polish.  Do not wear lotions, powders, or perfumes. Please wear deodorant and brush your teeth.  Do not shave 48 hours prior to surgery.  Men may shave face and neck.  Do not bring valuables to the hospital.  [facility] is not responsible for any belongings or valuables.  Contacts, dentures or bridgework may not be worn into surgery.  Leave your suitcase in the car.  After surgery it may be brought to your room.  For patients admitted to the hospital, discharge time will be determined by your treatment team.  Patients discharged the day of surgery will not be allowed to drive home.   Name and phone number of your driver:   family Special instructions:   DO NOT smoke the morning of your procedure.  Please read over the following fact sheets that you were given. Anesthesia Post-op Instructions and Care and Recovery After Surgery       Upper Endoscopy, Adult, Care After This sheet gives you information about how to care for yourself after your procedure. Your health care provider may also give you more specific instructions. If you have problems or questions, contact your health care provider. What can I expect after the procedure? After the procedure, it is common to have:  A sore throat.  Mild stomach pain or discomfort.  Bloating.  Nausea. Follow these instructions at  home:   Follow instructions from your health care provider about what to eat or drink after your procedure.  Return to your normal activities as told by your health care provider. Ask your health care provider what activities are safe for you.  Take over-the-counter and prescription medicines only as told by your health care provider.  Do not drive for 24 hours if you were given a sedative during your procedure.  Keep all follow-up visits as told by your health care provider. This is important. Contact a health care provider if you have:  A sore throat that lasts longer than one day.  Trouble swallowing. Get help right away if:  You vomit blood or your vomit looks like coffee grounds.  You have: ? A fever. ? Bloody, black, or tarry stools. ? A severe sore throat or you cannot swallow. ? Difficulty breathing. ? Severe pain in your chest or abdomen. Summary  After the procedure, it is common to have a sore throat, mild stomach discomfort, bloating, and nausea.  Do not drive for 24 hours if you were given a sedative during the procedure.  Follow instructions from your health care provider about what to eat or drink after your procedure.  Return to your normal activities as told by your health care provider. This information is not intended to replace advice given to you by your health care provider. Make sure you discuss any questions you have with  your health care provider. Document Revised: 09/25/2017 Document Reviewed: 09/03/2017 Elsevier Patient Education  [place].  Esophageal Dilatation Esophageal dilatation, also called esophageal dilation, is a procedure to widen or open (dilate) a blocked or narrowed part of the esophagus. The esophagus is the part of the body that moves food and liquid from the mouth to the stomach. You may need this procedure if:  You have a buildup of scar tissue in your esophagus that makes it difficult, painful, or impossible to  swallow. This can be caused by gastroesophageal reflux disease (GERD).  You have cancer of the esophagus.  There is a problem with how food moves through your esophagus. In some cases, you may need this procedure repeated at a later time to dilate the esophagus gradually. Tell a health care provider about:  Any allergies you have.  All medicines you are taking, including vitamins, herbs, eye drops, creams, and over-the-counter medicines.  Any problems you or family members have had with anesthetic medicines.  Any blood disorders you have.  Any surgeries you have had.  Any medical conditions you have.  Any antibiotic medicines you are required to take before dental procedures.  Whether you are pregnant or may be pregnant. What are the risks? Generally, this is a safe procedure. However, problems may occur, including:  Bleeding due to a tear in the lining of the esophagus.  A hole (perforation) in the esophagus. What happens before the procedure?  Follow instructions from your health care provider about eating or drinking restrictions.  Ask your health care provider about changing or stopping your regular medicines. This is especially important if you are taking diabetes medicines or blood thinners.  Plan to have someone take you home from the hospital or clinic.  Plan to have a responsible adult care for you for at least 24 hours after you leave the hospital or clinic. This is important. What happens during the procedure?  You may be given a medicine to help you relax (sedative).  A numbing medicine may be sprayed into the back of your throat, or you may gargle the medicine.  Your health care provider may perform the dilatation using various surgical instruments, such as: ? Simple dilators. This instrument is carefully placed in the esophagus to stretch it. ? Guided wire bougies. This involves using an endoscope to insert a wire into the esophagus. A dilator is passed  over this wire to enlarge the esophagus. Then the wire is removed. ? Balloon dilators. An endoscope with a small balloon at the end is inserted into the esophagus. The balloon is inflated to stretch the esophagus and open it up. The procedure may vary among health care providers and hospitals. What happens after the procedure?  Your blood pressure, heart rate, breathing rate, and blood oxygen level will be monitored until the medicines you were given have worn off.  Your throat may feel slightly sore and numb. This will improve slowly over time.  You will not be allowed to eat or drink until your throat is no longer numb.  When you are able to drink, urinate, and sit on the edge of the bed without nausea or dizziness, you may be able to return home. Follow these instructions at home:  Take over-the-counter and prescription medicines only as told by your health care provider.  Do not drive for 24 hours if you were given a sedative during your procedure.  You should have a responsible adult with you for 24 hours after  the procedure.  Follow instructions from your health care provider about any eating or drinking restrictions.  Do not use any products that contain nicotine or tobacco, such as cigarettes and e-cigarettes. If you need help quitting, ask your health care provider.  Keep all follow-up visits as told by your health care provider. This is important. Get help right away if you:  Have a fever.  Have chest pain.  Have pain that is not relieved by medication.  Have trouble breathing.  Have trouble swallowing.  Vomit blood. Summary  Esophageal dilatation, also called esophageal dilation, is a procedure to widen or open (dilate) a blocked or narrowed part of the esophagus.  Plan to have someone take you home from the hospital or clinic.  For this procedure, a numbing medicine may be sprayed into the back of your throat, or you may gargle the medicine.  Do not drive for  24 hours if you were given a sedative during your procedure. This information is not intended to replace advice given to you by your health care provider. Make sure you discuss any questions you have with your health care provider. Document Revised: 01/29/2019 Document Reviewed: 02/06/2017 Elsevier Patient Education  2020 [place] After These instructions provide you with information about caring for yourself after your procedure. Your health care provider may also give you more specific instructions. Your treatment has been planned according to current medical practices, but problems sometimes occur. Call your health care provider if you have any problems or questions after your procedure. What can I expect after the procedure? After your procedure, you may:  Feel sleepy for several hours.  Feel clumsy and have poor balance for several hours.  Feel forgetful about what happened after the procedure.  Have poor judgment for several hours.  Feel nauseous or vomit.  Have a sore throat if you had a breathing tube during the procedure. Follow these instructions at home: For at least 24 hours after the procedure:      Have a responsible adult stay with you. It is important to have someone help care for you until you are awake and alert.  Rest as needed.  Do not: ? Participate in activities in which you could fall or become injured. ? Drive. ? Use heavy machinery. ? Drink alcohol. ? Take sleeping pills or medicines that cause drowsiness. ? Make important decisions or sign legal documents. ? Take care of children on your own. Eating and drinking  Follow the diet that is recommended by your health care provider.  If you vomit, drink water, juice, or soup when you can drink without vomiting.  Make sure you have little or no nausea before eating solid foods. General instructions  Take over-the-counter and prescription medicines only as told by  your health care provider.  If you have sleep apnea, surgery and certain medicines can increase your risk for breathing problems. Follow instructions from your health care provider about wearing your sleep device: ? Anytime you are sleeping, including during daytime naps. ? While taking prescription pain medicines, sleeping medicines, or medicines that make you drowsy.  If you smoke, do not smoke without supervision.  Keep all follow-up visits as told by your health care provider. This is important. Contact a health care provider if:  You keep feeling nauseous or you keep vomiting.  You feel light-headed.  You develop a rash.  You have a fever. Get help right away if:  You have trouble breathing. Summary  For several hours after your procedure, you may feel sleepy and have poor judgment.  Have a responsible adult stay with you for at least 24 hours or until you are awake and alert. This information is not intended to replace advice given to you by your health care provider. Make sure you discuss any questions you have with your health care provider. Document Revised: 07/02/2017 Document Reviewed: 07/25/2015 Elsevier Patient Education  [place].

## 2020-02-12 ENCOUNTER — Encounter (HOSPITAL_COMMUNITY)
Admission: RE | Admit: 2020-02-12 | Discharge: 2020-02-12 | Disposition: A | Discharge status: Home / Self Care | Payer: Medicare HMO | Source: Ambulatory Visit | Attending: Internal Medicine | Admitting: Internal Medicine

## 2020-02-12 ENCOUNTER — Other Ambulatory Visit (HOSPITAL_COMMUNITY)
Admission: RE | Admit: 2020-02-12 | Discharge: 2020-02-12 | Disposition: A | Discharge status: Home / Self Care | Payer: Medicare HMO | Source: Ambulatory Visit | Attending: Internal Medicine | Admitting: Internal Medicine

## 2020-02-12 ENCOUNTER — Other Ambulatory Visit: Payer: Self-pay

## 2020-02-12 ENCOUNTER — Encounter (HOSPITAL_COMMUNITY): Payer: Self-pay

## 2020-02-12 DIAGNOSIS — R69 Illness, unspecified: Secondary | ICD-10-CM | POA: Diagnosis not present

## 2020-02-12 DIAGNOSIS — I5033 Acute on chronic diastolic (congestive) heart failure: Secondary | ICD-10-CM | POA: Diagnosis not present

## 2020-02-12 DIAGNOSIS — Z01812 Encounter for preprocedural laboratory examination: Secondary | ICD-10-CM | POA: Insufficient documentation

## 2020-02-12 DIAGNOSIS — N183 Chronic kidney disease, stage 3 unspecified: Secondary | ICD-10-CM | POA: Diagnosis not present

## 2020-02-12 DIAGNOSIS — Z20822 Contact with and (suspected) exposure to covid-19: Secondary | ICD-10-CM | POA: Insufficient documentation

## 2020-02-12 DIAGNOSIS — G5600 Carpal tunnel syndrome, unspecified upper limb: Secondary | ICD-10-CM | POA: Diagnosis not present

## 2020-02-12 DIAGNOSIS — I482 Chronic atrial fibrillation, unspecified: Secondary | ICD-10-CM | POA: Diagnosis not present

## 2020-02-12 DIAGNOSIS — I2510 Atherosclerotic heart disease of native coronary artery without angina pectoris: Secondary | ICD-10-CM | POA: Diagnosis not present

## 2020-02-12 DIAGNOSIS — L89893 Pressure ulcer of other site, stage 3: Secondary | ICD-10-CM | POA: Diagnosis not present

## 2020-02-12 DIAGNOSIS — I130 Hypertensive heart and chronic kidney disease with heart failure and stage 1 through stage 4 chronic kidney disease, or unspecified chronic kidney disease: Secondary | ICD-10-CM | POA: Diagnosis not present

## 2020-02-12 DIAGNOSIS — J449 Chronic obstructive pulmonary disease, unspecified: Secondary | ICD-10-CM | POA: Diagnosis not present

## 2020-02-12 DIAGNOSIS — I739 Peripheral vascular disease, unspecified: Secondary | ICD-10-CM | POA: Diagnosis not present

## 2020-02-12 HISTORY — DX: Sleep apnea, unspecified: G47.30

## 2020-02-12 LAB — CBC WITH DIFFERENTIAL/PLATELET
Abs Immature Granulocytes: 0.1 10*3/uL — ABNORMAL HIGH (ref 0.00–0.07)
Basophils Absolute: 0.1 10*3/uL (ref 0.0–0.1)
Basophils Relative: 1 %
Eosinophils Absolute: 0.3 10*3/uL (ref 0.0–0.5)
Eosinophils Relative: 4 %
HCT: 36.5 % (ref 36.0–46.0)
Hemoglobin: 11.5 g/dL — ABNORMAL LOW (ref 12.0–15.0)
Immature Granulocytes: 1 %
Lymphocytes Relative: 25 %
Lymphs Abs: 1.9 10*3/uL (ref 0.7–4.0)
MCH: 24 pg — ABNORMAL LOW (ref 26.0–34.0)
MCHC: 31.5 g/dL (ref 30.0–36.0)
MCV: 76 fL — ABNORMAL LOW (ref 80.0–100.0)
Monocytes Absolute: 0.6 10*3/uL (ref 0.1–1.0)
Monocytes Relative: 7 %
Neutro Abs: 4.8 10*3/uL (ref 1.7–7.7)
Neutrophils Relative %: 62 %
Platelets: 446 10*3/uL — ABNORMAL HIGH (ref 150–400)
RBC: 4.8 MIL/uL (ref 3.87–5.11)
RDW: 22.2 % — ABNORMAL HIGH (ref 11.5–15.5)
WBC: 7.8 10*3/uL (ref 4.0–10.5)
nRBC: 0 % (ref 0.0–0.2)

## 2020-02-12 LAB — BASIC METABOLIC PANEL
Anion gap: 11 (ref 5–15)
BUN: 22 mg/dL (ref 8–23)
CO2: 23 mmol/L (ref 22–32)
Calcium: 8.9 mg/dL (ref 8.9–10.3)
Chloride: 99 mmol/L (ref 98–111)
Creatinine, Ser: 1.31 mg/dL — ABNORMAL HIGH (ref 0.44–1.00)
GFR, Estimated: 40 mL/min — ABNORMAL LOW (ref >60)
Glucose, Bld: 134 mg/dL — ABNORMAL HIGH (ref 70–99)
Potassium: 3.6 mmol/L (ref 3.5–5.1)
Sodium: 133 mmol/L — ABNORMAL LOW (ref 135–145)

## 2020-02-13 LAB — SARS CORONAVIRUS 2 (TAT 6-24 HRS): SARS Coronavirus 2: NEGATIVE

## 2020-02-16 ENCOUNTER — Ambulatory Visit (HOSPITAL_COMMUNITY): Payer: Medicare HMO | Admitting: Certified Registered"

## 2020-02-16 ENCOUNTER — Encounter (HOSPITAL_COMMUNITY)
Admission: RE | Disposition: A | Discharge status: Home / Self Care | Payer: Self-pay | Source: Home / Self Care | Attending: Internal Medicine

## 2020-02-16 ENCOUNTER — Ambulatory Visit (HOSPITAL_COMMUNITY)
Admission: RE | Admit: 2020-02-16 | Discharge: 2020-02-16 | Disposition: A | Discharge status: Home / Self Care | Payer: Medicare HMO | Attending: Internal Medicine | Admitting: Internal Medicine

## 2020-02-16 ENCOUNTER — Encounter: Payer: Self-pay | Admitting: Internal Medicine

## 2020-02-16 ENCOUNTER — Encounter (HOSPITAL_COMMUNITY): Payer: Self-pay | Admitting: Internal Medicine

## 2020-02-16 DIAGNOSIS — Z880 Allergy status to penicillin: Secondary | ICD-10-CM | POA: Insufficient documentation

## 2020-02-16 DIAGNOSIS — Z888 Allergy status to other drugs, medicaments and biological substances status: Secondary | ICD-10-CM | POA: Insufficient documentation

## 2020-02-16 DIAGNOSIS — R1310 Dysphagia, unspecified: Secondary | ICD-10-CM | POA: Diagnosis not present

## 2020-02-16 DIAGNOSIS — R1314 Dysphagia, pharyngoesophageal phase: Secondary | ICD-10-CM | POA: Insufficient documentation

## 2020-02-16 DIAGNOSIS — K3189 Other diseases of stomach and duodenum: Secondary | ICD-10-CM | POA: Insufficient documentation

## 2020-02-16 DIAGNOSIS — D509 Iron deficiency anemia, unspecified: Secondary | ICD-10-CM | POA: Diagnosis not present

## 2020-02-16 DIAGNOSIS — Z881 Allergy status to other antibiotic agents status: Secondary | ICD-10-CM | POA: Insufficient documentation

## 2020-02-16 DIAGNOSIS — Z885 Allergy status to narcotic agent status: Secondary | ICD-10-CM | POA: Diagnosis not present

## 2020-02-16 DIAGNOSIS — K921 Melena: Secondary | ICD-10-CM | POA: Insufficient documentation

## 2020-02-16 DIAGNOSIS — I509 Heart failure, unspecified: Secondary | ICD-10-CM | POA: Diagnosis not present

## 2020-02-16 DIAGNOSIS — Z87891 Personal history of nicotine dependence: Secondary | ICD-10-CM | POA: Insufficient documentation

## 2020-02-16 DIAGNOSIS — I130 Hypertensive heart and chronic kidney disease with heart failure and stage 1 through stage 4 chronic kidney disease, or unspecified chronic kidney disease: Secondary | ICD-10-CM | POA: Diagnosis not present

## 2020-02-16 DIAGNOSIS — N189 Chronic kidney disease, unspecified: Secondary | ICD-10-CM | POA: Diagnosis not present

## 2020-02-16 DIAGNOSIS — J449 Chronic obstructive pulmonary disease, unspecified: Secondary | ICD-10-CM | POA: Diagnosis not present

## 2020-02-16 HISTORY — PX: BIOPSY: SHX5522

## 2020-02-16 HISTORY — PX: MALONEY DILATION: SHX5535

## 2020-02-16 HISTORY — PX: ESOPHAGOGASTRODUODENOSCOPY (EGD) WITH PROPOFOL: SHX5813

## 2020-02-16 SURGERY — ESOPHAGOGASTRODUODENOSCOPY (EGD) WITH PROPOFOL
Anesthesia: General

## 2020-02-16 MED ORDER — GLYCOPYRROLATE 0.2 MG/ML IJ SOLN
INTRAMUSCULAR | Status: AC
  Filled 2020-02-16: qty 1

## 2020-02-16 MED ORDER — PROPOFOL 10 MG/ML IV BOLUS
INTRAVENOUS | Status: DC | PRN
  Administered 2020-02-16: 40 mg via INTRAVENOUS
  Administered 2020-02-16: 80 mg via INTRAVENOUS
  Administered 2020-02-16: 20 mg via INTRAVENOUS

## 2020-02-16 MED ORDER — LACTATED RINGERS IV SOLN: INTRAVENOUS | Status: DC | PRN

## 2020-02-16 MED ORDER — CHLORHEXIDINE GLUCONATE CLOTH 2 % EX PADS: 6.0000 | MEDICATED_PAD | Freq: Once | CUTANEOUS | Status: DC

## 2020-02-16 MED ORDER — LIDOCAINE VISCOUS HCL 2 % MT SOLN
15.0000 mL | Freq: Once | OROMUCOSAL | Status: AC
  Administered 2020-02-16: 15 mL via OROMUCOSAL

## 2020-02-16 MED ORDER — LIDOCAINE VISCOUS HCL 2 % MT SOLN
OROMUCOSAL | Status: AC
  Filled 2020-02-16: qty 15

## 2020-02-16 MED ORDER — STERILE WATER FOR IRRIGATION IR SOLN
Status: DC | PRN
  Administered 2020-02-16: 100 mL

## 2020-02-16 MED ORDER — LACTATED RINGERS IV SOLN
INTRAVENOUS | Status: DC
  Administered 2020-02-16: 1000 mL via INTRAVENOUS

## 2020-02-16 MED ORDER — LIDOCAINE HCL (CARDIAC) PF 100 MG/5ML IV SOSY
PREFILLED_SYRINGE | INTRAVENOUS | Status: DC | PRN
  Administered 2020-02-16: 50 mg via INTRAVENOUS

## 2020-02-16 MED ORDER — GLYCOPYRROLATE 0.2 MG/ML IJ SOLN
0.2000 mg | Freq: Once | INTRAMUSCULAR | Status: AC
  Administered 2020-02-16: 0.2 mg via INTRAVENOUS

## 2020-02-16 NOTE — Anesthesia Procedure Notes (Signed)
Date/Time: 02/16/2020 11:40 AM Performed by: Orlie Dakin, CRNA Pre-anesthesia Checklist: Patient identified, Emergency Drugs available, Suction available and Patient being monitored Patient Re-evaluated:Patient Re-evaluated prior to induction Oxygen Delivery Method: Nasal cannula Induction Type: IV induction Placement Confirmation: positive ETCO2

## 2020-02-16 NOTE — Op Note (Signed)
[facility] Patient Name: [name] Procedure Date: 02/16/2020 11:18 AM MRN: [id number] Date of Birth: [birth year] Attending MD: Norvel Richards , MD CSN: [id number] Age: 86 Admit Type: Outpatient Procedure:                Upper GI endoscopy Indications:              Dysphagia Providers:                Norvel Richards, MD, Crystal Page, Aram Candela Referring MD:              Medicines:                Propofol per Anesthesia Complications:            No immediate complications. Estimated Blood Loss:     Estimated blood loss was minimal. Procedure:                Pre-Anesthesia Assessment:                           - Prior to the procedure, a History and Physical                            was performed, and patient medications and                            allergies were reviewed. The patient's tolerance of                            previous anesthesia was also reviewed. The risks                            and benefits of the procedure and the sedation                            options and risks were discussed with the patient.                            All questions were answered, and informed consent                            was obtained. Prior Anticoagulants: The patient                            last took Eliquis (apixaban) 3 days prior to the                            procedure. ASA Grade Assessment: III - A patient                            with severe systemic disease. After reviewing the                            risks and benefits, the patient was deemed in  satisfactory condition to undergo the procedure.                           After obtaining informed consent, the endoscope was                            passed under direct vision. Throughout the                            procedure, the patient's blood pressure, pulse, and                            oxygen saturations were monitored continuously. The                             [id number]-H190 ([id number]) scope was introduced through the                            mouth, and advanced to the second part of duodenum.                            The upper GI endoscopy was accomplished without                            difficulty. The patient tolerated the procedure                            well. Scope In: 11:38:55 AM Scope Out: 11:44:30 AM Total Procedure Duration: 0 hours 5 minutes 35 seconds  Findings:      The examined esophagus was normal.      Erythematous mucosa was found in the stomach. Antrum and body. Somewhat       polypoid mucosa. No ulcer or infiltrating process seen. Patent pylorus. .      The duodenal bulb and second portion of the duodenum were normal. The       scope was withdrawn. Dilation was performed with a Maloney dilator with       mild resistance at 74 Fr. The dilation site was examined following       endoscope reinsertion and showed no change. Estimated blood loss was       minimal. Finally, biopsies the abnormal gastric mucosa taken for       histologic study Impression:               - Normal esophagus. Dilated.                           - Erythematous mucosa in the stomach. Biopsied.                           - Normal duodenal bulb and second portion of the                            duodenum. Moderate Sedation:      Moderate (conscious) sedation was personally administered by an       anesthesia professional. The following parameters were monitored:  oxygen       saturation, heart rate, blood pressure, respiratory rate, EKG, adequacy       of pulmonary ventilation, and response to care. Recommendation:           - Patient has a contact number available for                            emergencies. The signs and symptoms of potential                            delayed complications were discussed with the                            patient. Return to normal activities tomorrow.                            Written discharge instructions  were provided to the                            patient.                           - Resume previous diet.                           - Continue present medications. Resume Eliquis                            today. Office follow-up with [place] in 6 weeks?"Aliene Altes Procedure Code(s):        --- Professional ---                           [phone], Esophagogastroduodenoscopy, flexible,                            transoral; with biopsy, single or multiple                           43450, Dilation of esophagus, by unguided sound or                            bougie, single or multiple passes Diagnosis Code(s):        --- Professional ---                           K31.89, Other diseases of stomach and duodenum                           R13.10, Dysphagia, unspecified CPT copyright 2019 American Medical Association. All rights reserved. The codes documented in this report are preliminary and upon coder review may  be revised to meet current compliance requirements. Cristopher Estimable. Skylor Hughson, MD Norvel Richards, MD 02/16/2020 11:51:54 AM This report has been signed electronically. Number of Addenda:  0 

## 2020-02-16 NOTE — Transfer of Care (Signed)
Immediate Anesthesia Transfer of Care Note  Patient: [name]  Procedure(s) Performed: ESOPHAGOGASTRODUODENOSCOPY (EGD) WITH PROPOFOL (N/A ) MALONEY DILATION (N/A ) BIOPSY  Patient Location: PACU  Anesthesia Type:General  Level of Consciousness: drowsy  Airway & Oxygen Therapy: Patient Spontanous Breathing  Post-op Assessment: Report given to RN and Post -op Vital signs reviewed and stable  Post vital signs: Reviewed and stable  Last Vitals:  Vitals Value Taken Time  BP 106/39 02/16/20 1148  Temp    Pulse 60 02/16/20 1149  Resp 19 02/16/20 1149  SpO2 100 % 02/16/20 1149  Vitals shown include unvalidated device data.  Last Pain:  Vitals:   02/16/20 1134  TempSrc:   PainSc: 7       Patients Stated Pain Goal: 9 (94/09/05 0256)  Complications: No complications documented.

## 2020-02-16 NOTE — Discharge Instructions (Signed)
EGD Discharge instructions Please read the instructions outlined below and refer to this sheet in the next few weeks. These discharge instructions provide you with general information on caring for yourself after you leave the hospital. Your doctor may also give you specific instructions. While your treatment has been planned according to the most current medical practices available, unavoidable complications occasionally occur. If you have any problems or questions after discharge, please call your doctor. ACTIVITY  You may resume your regular activity but move at a slower pace for the next 24 hours.   Take frequent rest periods for the next 24 hours.   Walking will help expel (get rid of) the air and reduce the bloated feeling in your abdomen.   No driving for 24 hours (because of the anesthesia (medicine) used during the test).   You may shower.   Do not sign any important legal documents or operate any machinery for 24 hours (because of the anesthesia used during the test).  NUTRITION  Drink plenty of fluids.   You may resume your normal diet.   Begin with a light meal and progress to your normal diet.   Avoid alcoholic beverages for 24 hours or as instructed by your caregiver.  MEDICATIONS  You may resume your normal medications unless your caregiver tells you otherwise.  WHAT YOU CAN EXPECT TODAY  You may experience abdominal discomfort such as a feeling of fullness or "gas" pains.  FOLLOW-UP  Your doctor will discuss the results of your test with you.  SEEK IMMEDIATE MEDICAL ATTENTION IF ANY OF THE FOLLOWING OCCUR:  Excessive nausea (feeling sick to your stomach) and/or vomiting.   Severe abdominal pain and distention (swelling).   Trouble swallowing.   Temperature over 101 F (37.8 C).   Rectal bleeding or vomiting of blood.    Your esophagus was stretched today.  Your stomach was biopsied.  Resume Eliquis today  Office visit with Aliene Altes in 6  weeks  Further recommendations to follow pending review of pathology report  At patient request, I called Nyanna Heideman at [phone] -reviewed results

## 2020-02-16 NOTE — Anesthesia Postprocedure Evaluation (Signed)
Anesthesia Post Note  Patient: [name]  Procedure(s) Performed: ESOPHAGOGASTRODUODENOSCOPY (EGD) WITH PROPOFOL (N/A ) MALONEY DILATION (N/A ) BIOPSY  Patient location during evaluation: Phase II Anesthesia Type: General Level of consciousness: awake and alert and oriented Pain management: pain level controlled Vital Signs Assessment: post-procedure vital signs reviewed and stable Respiratory status: spontaneous breathing, nonlabored ventilation and respiratory function stable Cardiovascular status: blood pressure returned to baseline Postop Assessment: no apparent nausea or vomiting Anesthetic complications: no   No complications documented.   Last Vitals:  Vitals:   02/16/20 1215 02/16/20 1222  BP: (!) 125/44 (!) 135/59  Pulse: (!) 119 (!) 56  Resp: 19 18  Temp:  36.7 C  SpO2: 92% 96%    Last Pain:  Vitals:   02/16/20 1222  TempSrc: Oral  PainSc: [place]

## 2020-02-16 NOTE — Anesthesia Preprocedure Evaluation (Signed)
Anesthesia Evaluation  Patient identified by MRN, date of birth, ID band Patient awake    Reviewed: Allergy & Precautions, H&P , NPO status , Patient's Chart, lab work & pertinent test results, reviewed documented beta blocker date and time   Airway Mallampati: II  TM Distance: >3 FB Neck ROM: full    Dental no notable dental hx. (+) Edentulous Upper, Edentulous Lower   Pulmonary shortness of breath and with exertion, sleep apnea , pneumonia, resolved, COPD, former smoker,    Pulmonary exam normal breath sounds clear to auscultation       Cardiovascular Exercise Tolerance: Good hypertension, + angina + CAD, + Past MI, + Peripheral Vascular Disease, +CHF and + DOE  + dysrhythmias Atrial Fibrillation  Rhythm:regular Rate:Normal     Neuro/Psych  Headaches, PSYCHIATRIC DISORDERS Anxiety Depression  Neuromuscular disease    GI/Hepatic Neg liver ROS, GERD  Medicated,  Endo/Other  Hypothyroidism   Renal/GU CRFRenal disease  negative genitourinary   Musculoskeletal   Abdominal   Peds  Hematology  (+) Blood dyscrasia, anemia ,   Anesthesia Other Findings   Reproductive/Obstetrics negative OB ROS                             Anesthesia Physical Anesthesia Plan  ASA: III  Anesthesia Plan: General   Post-op Pain Management:    Induction:   PONV Risk Score and Plan: Propofol infusion  Airway Management Planned:   Additional Equipment:   Intra-op Plan:   Post-operative Plan:   Informed Consent: I have reviewed the patients History and Physical, chart, labs and discussed the procedure including the risks, benefits and alternatives for the proposed anesthesia with the patient or authorized representative who has indicated his/her understanding and acceptance.     Dental Advisory Given  Plan Discussed with: CRNA  Anesthesia Plan Comments:         Anesthesia Quick Evaluation

## 2020-02-16 NOTE — H&P (Signed)
@LOGO @   Primary Care Physician:  Rosine Door Primary Gastroenterologist:  Dr. Gala Romney  Pre-Procedure History & Physical: HPI:  [name] is a 86 y.o. female here for further evaluation  of melena and microcytic anemia requiring transfusion via EGD.  Also, esophageal dysphagia to solids.  Eliquis held 3 days ago.  Past Medical History:  Diagnosis Date  . Anxiety   . Arthritis   . Atrial fibrillation ([place])   . Bursitis    Left shoulder  . Cataract   . CHF (congestive heart failure) ([place])   . CKD (chronic kidney disease)    stage 3-4  . COPD (chronic obstructive pulmonary disease) ([place])   . Coronary atherosclerosis of native coronary artery    a. s/p DES to LCx in 04/2013 b. cath in 11/2015 showing patent stent with 20% prox-LAD and 80% ostial RCA stenosis for which medical management was recommended due to small artery size  . Depression   . Diastolic heart failure ([facility])    EF 55-60%  . Dysphagia, unspecified(787.20)   . Dyspnea   . Dysrhythmia   . Essential hypertension   . GERD (gastroesophageal reflux disease)    Hx Schatzki's ring, multiple EGD/ED last 01/06/2004  . Gout   . Headache   . History of anemia   . Hyperlipidemia   . Internal hemorrhoids without mention of complication   . MI (myocardial infarction) ([place]) 2006  . Microscopic colitis 2003  . Panic disorder without agoraphobia   . Paresthesia   . Pneumonia 12/2011  . PVD (peripheral vascular disease) ([place])   . S/P colonoscopy 09/27/2001   internal hemorrhoids, desc colon inflam polyp, SB BX-chronic duodenitis, colitis  . Sleep apnea   . Thyroid disease     Past Surgical History:  Procedure Laterality Date  . ABDOMINAL HYSTERECTOMY    . ABDOMINAL HYSTERECTOMY    . ANTERIOR AND POSTERIOR REPAIR     with resection of vagina  . ANTERIOR LAT LUMBAR FUSION N/A 08/01/2016   Procedure: Lumbar Two-Lumbar Five Transpsoas lateral interbody fusion with Lumbar Two-Three lateral plate fixation;   Surgeon: Kevan Ny Ditty, MD;  Location: [facility];  Service: Neurosurgery;  Laterality: N/A;  L2-5 Transpsoas lateral interbody fusion with L2-3 lateral plate fixation  . APPENDECTOMY    . BACK SURGERY    . BIOPSY  07/05/2015   Procedure: BIOPSY;  Surgeon: Daneil Dolin, MD;  [facility];  Service: Endoscopy;;  gastric polyp biopsy, ascending colon biopsy  . BLADDER SUSPENSION  11/09/2011   Procedure: TRANSVAGINAL TAPE (TVT) PROCEDURE;  Surgeon: Marissa Nestle, MD;  Location: [facility];  Service: Urology;  Laterality: N/A;  . bladder tack  06/2010  . BREAST LUMPECTOMY  1998   left, benign  . CARDIAC CATHETERIZATION    . CARDIAC CATHETERIZATION    . CARDIAC CATHETERIZATION N/A 12/16/2015   Procedure: Left Heart Cath and Coronary Angiography;  Surgeon: Troy Sine, MD;  Location: [facility] CV LAB;  Service: Cardiovascular;  Laterality: N/A;  . CARDIOVERSION N/A 10/04/2017   Procedure: CARDIOVERSION;  Surgeon: Herminio Commons, MD;  [facility];  Service: Cardiovascular;  Laterality: N/A;  . CARDIOVERSION N/A 01/30/2018   Procedure: CARDIOVERSION;  Surgeon: Herminio Commons, MD;  [facility];  Service: Cardiovascular;  Laterality: N/A;  . CARDIOVERSION N/A 11/10/2019   Procedure: CARDIOVERSION;  Surgeon: Josue Hector, MD;  Location: [facility];  Service: Cardiovascular;  Laterality: N/A;  . CARPAL TUNNEL RELEASE  1989   left  . cataract surgery    . CHOLECYSTECTOMY  1998  . Cholecystectomy    . COLONOSCOPY  03/16/2011   multiple hyperplastic colon polyps, sigmoid diverticulosis, melanosis coli  . COLONOSCOPY WITH PROPOFOL N/A 07/05/2015   RMR:one 5 mm polyp in descending colon  . CORONARY ANGIOGRAPHY N/A 05/16/2018   Procedure: CORONARY ANGIOGRAPHY (CATH LAB);  Surgeon: Belva Crome, MD;  Location: [facility];  Service: Cardiovascular;  Laterality: N/A;  . CORONARY ANGIOPLASTY WITH STENT PLACEMENT  2015  . ESOPHAGEAL DILATION N/A  07/05/2015   Procedure: ESOPHAGEAL DILATION;  Surgeon: Daneil Dolin, MD;  [facility];  Service: Endoscopy;  Laterality: N/A;  . ESOPHAGOGASTRODUODENOSCOPY (EGD) WITH PROPOFOL N/A 07/05/2015   WYO:[id number]  . JOINT REPLACEMENT Right 2007   right knee  . left hand surgery    . LEFT HEART CATHETERIZATION WITH CORONARY ANGIOGRAM N/A 05/14/2013   Procedure: LEFT HEART CATHETERIZATION WITH CORONARY ANGIOGRAM;  Surgeon: Blane Ohara, MD;  Location: [facility] CATH LAB;  Service: Cardiovascular;  Laterality: N/A;  . left rotator cuff surgery    . LUMBAR LAMINECTOMY/DECOMPRESSION MICRODISCECTOMY N/A 10/11/2012   Procedure: LUMBAR LAMINECTOMY/DECOMPRESSION MICRODISCECTOMY 2 LEVELS;  Surgeon: Floyce Stakes, MD;  Location: [facility] [facility]UR[facility] ORS;  Service: Neurosurgery;  Laterality: N/A;  L3-4 L4-5 Laminectomy  . LUMBAR WOUND DEBRIDEMENT N/A 09/27/2015   Procedure: Exploration of Lumbar Wound w/ Repair CSF Leak/Lumbar Drain Placement;  Surgeon: Leeroy Cha, MD;  Location: [place] NEURO ORS;  Service: Neurosurgery;  Laterality: N/A;  . MALONEY DILATION  03/16/2011   Gastritis. No H.pylori on bx. 7F maloney dilation with disruption of  occult cevical esophageal web  . NASAL SINUS SURGERY    . right knee replacement  2007  . right leg benign tumor    . SHOULDER SURGERY Left   . TEE WITHOUT CARDIOVERSION N/A 10/04/2017   Procedure: TRANSESOPHAGEAL ECHOCARDIOGRAM (TEE) WITH PROPOFOL;  Surgeon: Herminio Commons, MD;  [facility];  Service: Cardiovascular;  Laterality: N/A;  . TEE WITHOUT CARDIOVERSION N/A 11/10/2019   Procedure: TRANSESOPHAGEAL ECHOCARDIOGRAM (TEE);  Surgeon: Josue Hector, MD;  Location: [facility] END[facility]COPY;  Service: Cardiovascular;  Laterality: N/A;  . TONSILLECTOMY    . unspecified area, hysterectomy  1972   partial    Prior to Admission medications   Medication Sig Start Date End Date Taking? Authorizing Provider  acetaminophen (TYLENOL) 500 MG tablet Take 500 mg by mouth every 6  (six) hours as needed for headache.    Yes [provider]  ALPRAZolam Duanne Moron) 0.5 MG tablet Take 0.5 mg by mouth as needed.  09/29/19  Yes [provider]  diltiazem (CARDIZEM CD) 180 MG 24 hr capsule Take 1 capsule (180 mg total) by mouth daily. 09/24/19  Yes Tat, Shanon Brow, MD  DULoxetine (CYMBALTA) 60 MG capsule Take 60 mg by mouth at bedtime.    Yes [provider]  fluticasone (CUTIVATE) 0.05 % cream Apply 1 application topically 2 (two) times daily.  07/15/19  Yes [provider]  fluticasone (FLONASE) 50 MCG/ACT nasal spray Place 2 sprays into both nostrils 2 (two) times daily as needed for allergies.    Yes [provider]  furosemide (LASIX) 40 MG tablet Take 1 tablet (40 mg total) by mouth daily. 09/24/19  Yes Tat, Shanon Brow, MD  hydrocortisone (ANUSOL-HC) 2.5 % rectal cream Place 1 application rectally 2 (two) times daily. 12/12/19  Yes Harper, Kristen S, PA-C  ipratropium-albuterol (DUONEB) 0.5-2.5 (3) MG/3ML SOLN Take  3 mLs by nebulization every 6 (six) hours. 12/18/15  Yes Regalado, Belkys A, MD  isosorbide mononitrate (IMDUR) 60 MG 24 hr tablet Take 1 tablet (60 mg total) by mouth daily. 02/21/19 05/28/20 Yes Strader, Fransisco Hertz, PA-C  magnesium oxide (MAG-OX) 400 MG tablet TAKE ONE TABLET BY MOUTH DAILY. 10/21/19  Yes Verta Ellen., NP  methocarbamol (ROBAXIN) 500 MG tablet Take 500 mg by mouth every 6 (six) hours as needed for muscle spasms.    Yes [provider]  metoprolol succinate (TOPROL-XL) 25 MG 24 hr tablet Take 25 mg by mouth daily. Takes with the 50mg  tablet to equal 75mg  total for the day.   Yes [provider]  metoprolol succinate (TOPROL-XL) 50 MG 24 hr tablet Take 50 mg by mouth daily. Takes with the 25mg  tablet to equal 75mg  total for the day.   Yes [provider]  Multiple Vitamin (MULTIVITAMIN WITH MINERALS) TABS tablet Take 1 tablet by mouth daily. Centrum    Yes [provider]  pantoprazole  (PROTONIX) 40 MG tablet Take 1 tablet (40 mg total) by mouth 2 (two) times daily before a meal. 12/12/19  Yes Jodi Mourning, Kristen S, PA-C  potassium chloride (KLOR-CON) 10 MEQ tablet Take 1 tablet (10 mEq total) by mouth daily. 06/17/19  Yes Herminio Commons, MD  sucralfate (CARAFATE) 1 GM/10ML suspension Take 10 mLs (1 g total) by mouth 4 (four) times daily -  with meals and at bedtime. 12/12/19  Yes Erenest Rasher, PA-C  traMADol (ULTRAM) 50 MG tablet Take 50 mg by mouth every 6 (six) hours as needed for pain. 10/31/19  Yes [provider]  apixaban (ELIQUIS) 5 MG TABS tablet Take 1 tablet (5 mg total) by mouth 2 (two) times daily. 07/14/19   Evans Lance, MD  levothyroxine (SYNTHROID, LEVOTHROID) 25 MCG tablet Take 1 tablet by mouth daily.  06/07/18   [provider]  nitroGLYCERIN (NITROSTAT) 0.4 MG SL tablet Place 1 tablet (0.4 mg total) under the tongue every 5 (five) minutes as needed for chest pain. Reported on 08/04/2015 11/20/17   Herminio Commons, MD  sodium chloride (OCEAN) 0.65 % SOLN nasal spray Place 1 spray into both nostrils as needed (allergies).     [provider]    Allergies as of 12/24/2019 - Review Complete 12/12/2019  Allergen Reaction Noted  . Cephalosporins Diarrhea and Nausea Only 08/04/2015  . Levaquin [levofloxacin in d5w] Swelling 07/06/2014  . Macrodantin [nitrofurantoin macrocrystal] Swelling 07/06/2014  . Phenothiazines Anaphylaxis and Hives 08/16/2011  . Polysorbate Anaphylaxis 08/16/2011  . Prednisone Shortness Of Breath   . Buspirone Itching 07/06/2014  . Cardura [doxazosin mesylate] Itching 07/06/2014  . Codeine Itching   . Acyclovir and related Itching 10/07/2015  . Colcrys [colchicine] Nausea Only 10/04/2018  . Prochlorperazine Other (See Comments) 08/16/2011  . Ranexa [ranolazine]  04/06/2015  . Atorvastatin Hives 08/16/2011  . Ofloxacin Rash   . Other Itching and Rash 10/10/2012  . Penicillins Other (See Comments)  11/09/2011  . Pimozide Hives and Itching 08/16/2011    Family History  Problem Relation Age of Onset  . Stroke Mother   . Parkinson's disease Father   . Coronary artery disease Other        family Hx-sons  . Cancer Other   . Stroke Other        family Hx  . Hypertension Other        family Hx  . Diabetes Brother   . Heart  disease Son        before age 21  . Diabetes Son   . Stroke Daughter 48  . Colon cancer Neg Hx     Social History   Socioeconomic History  . Marital status: Divorced    Spouse name: Not on file  . Number of children: 5  . Years of education: Not on file  . Highest education level: Not on file  Occupational History  . Occupation: retired  Tobacco Use  . Smoking status: Former Smoker    Packs/day: 1.00    Years: 64.00    Pack years: 64.00    Types: Cigarettes    Start date: 12/24/1947    Quit date: 11/17/2001    Years since quitting: 18.2  . Smokeless tobacco: Never Used  . Tobacco comment: Quit smoking in 2003  Vaping Use  . Vaping Use: Never used  Substance and Sexual Activity  . Alcohol use: Yes    Alcohol/week: 0.0 standard drinks    Comment: only at new years.   . Drug use: No  . Sexual activity: Never  Other Topics Concern  . Not on file  Social History Narrative   Divorced.   Sister had colon perforation & died from complications in [place], [place]   Social Determinants of Health   Financial Resource Strain:   . Difficulty of Paying Living Expenses: Not on file  Food Insecurity:   . Worried About Charity fundraiser in the Last Year: Not on file  . Ran Out of Food in the Last Year: Not on file  Transportation Needs:   . Lack of Transportation (Medical): Not on file  . Lack of Transportation (Non-Medical): Not on file  Physical Activity:   . Days of Exercise per Week: Not on file  . Minutes of Exercise per Session: Not on file  Stress:   . Feeling of Stress : Not on file  Social Connections:   . Frequency of Communication with  Friends and Family: Not on file  . Frequency of Social Gatherings with Friends and Family: Not on file  . Attends Religious Services: Not on file  . Active Member of Clubs or Organizations: Not on file  . Attends Archivist Meetings: Not on file  . Marital Status: Not on file  Intimate Partner Violence:   . Fear of Current or Ex-Partner: Not on file  . Emotionally Abused: Not on file  . Physically Abused: Not on file  . Sexually Abused: Not on file    Review of Systems: See HPI, otherwise negative ROS  Physical Exam: BP (!) 151/64   Pulse 62   Temp 97.8 F (36.6 C) (Oral)   Resp 15   Ht 5\' 1"  (1.549 m)   Wt 68.9 kg   SpO2 100%   BMI 28.72 kg/m  General:   Alert,  Well-developed, well-nourished, pleasant and cooperative in NAD Neck:  Supple; no masses or thyromegaly. No significant cervical adenopathy. Lungs:  Clear throughout to auscultation.   No wheezes, crackles, or rhonchi. No acute distress. Heart:  Regular rate and rhythm; no murmurs, clicks, rubs,  or gallops. Abdomen: Non-distended, normal bowel sounds.  Soft and nontender without appreciable mass or hepatosplenomegaly.  Pulses:  Normal pulses noted. Extremities:  Without clubbing or edema.  Impression/Plan:  86 year old lady with microcytic anemia both melena and hematochezia.  Esophageal dysphagia.  Here for EGD with possible esophageal dilation as feasible/appropriate. It was held 3 days ago.  Patient has declined dating  colonoscopy thus far. The risks, benefits, limitations, alternatives and imponderables have been reviewed with the patient. Questions have been answered. All parties are agreeable.      Notice: This dictation was prepared with Dragon dictation along with smaller phrase technology. Any transcriptional errors that result from this process are unintentional and may not be corrected upon review.

## 2020-02-17 ENCOUNTER — Other Ambulatory Visit: Payer: Self-pay

## 2020-02-17 DIAGNOSIS — R69 Illness, unspecified: Secondary | ICD-10-CM | POA: Diagnosis not present

## 2020-02-17 DIAGNOSIS — I482 Chronic atrial fibrillation, unspecified: Secondary | ICD-10-CM | POA: Diagnosis not present

## 2020-02-17 DIAGNOSIS — I2510 Atherosclerotic heart disease of native coronary artery without angina pectoris: Secondary | ICD-10-CM | POA: Diagnosis not present

## 2020-02-17 DIAGNOSIS — G5600 Carpal tunnel syndrome, unspecified upper limb: Secondary | ICD-10-CM | POA: Diagnosis not present

## 2020-02-17 DIAGNOSIS — I5033 Acute on chronic diastolic (congestive) heart failure: Secondary | ICD-10-CM | POA: Diagnosis not present

## 2020-02-17 DIAGNOSIS — J449 Chronic obstructive pulmonary disease, unspecified: Secondary | ICD-10-CM | POA: Diagnosis not present

## 2020-02-17 DIAGNOSIS — I130 Hypertensive heart and chronic kidney disease with heart failure and stage 1 through stage 4 chronic kidney disease, or unspecified chronic kidney disease: Secondary | ICD-10-CM | POA: Diagnosis not present

## 2020-02-17 DIAGNOSIS — N183 Chronic kidney disease, stage 3 unspecified: Secondary | ICD-10-CM | POA: Diagnosis not present

## 2020-02-17 DIAGNOSIS — L89893 Pressure ulcer of other site, stage 3: Secondary | ICD-10-CM | POA: Diagnosis not present

## 2020-02-17 DIAGNOSIS — I739 Peripheral vascular disease, unspecified: Secondary | ICD-10-CM | POA: Diagnosis not present

## 2020-02-17 LAB — SURGICAL PATHOLOGY

## 2020-02-18 ENCOUNTER — Encounter: Payer: Self-pay | Admitting: Internal Medicine

## 2020-02-18 DIAGNOSIS — R300 Dysuria: Secondary | ICD-10-CM | POA: Diagnosis not present

## 2020-02-18 DIAGNOSIS — N3000 Acute cystitis without hematuria: Secondary | ICD-10-CM | POA: Diagnosis not present

## 2020-02-18 DIAGNOSIS — Z6829 Body mass index (BMI) 29.0-29.9, adult: Secondary | ICD-10-CM | POA: Diagnosis not present

## 2020-02-20 ENCOUNTER — Encounter (HOSPITAL_COMMUNITY): Payer: Self-pay | Admitting: Internal Medicine

## 2020-02-23 ENCOUNTER — Encounter: Payer: Self-pay | Admitting: Cardiology

## 2020-02-23 ENCOUNTER — Ambulatory Visit (INDEPENDENT_AMBULATORY_CARE_PROVIDER_SITE_OTHER): Payer: Medicare HMO | Admitting: Cardiology

## 2020-02-23 VITALS — BP 140/70 | HR 71 | Ht 61.0 in | Wt 156.6 lb

## 2020-02-23 DIAGNOSIS — I2510 Atherosclerotic heart disease of native coronary artery without angina pectoris: Secondary | ICD-10-CM | POA: Diagnosis not present

## 2020-02-23 DIAGNOSIS — I5032 Chronic diastolic (congestive) heart failure: Secondary | ICD-10-CM

## 2020-02-23 DIAGNOSIS — R0602 Shortness of breath: Secondary | ICD-10-CM

## 2020-02-23 DIAGNOSIS — I4891 Unspecified atrial fibrillation: Secondary | ICD-10-CM

## 2020-02-23 MED ORDER — DILTIAZEM HCL ER COATED BEADS 120 MG PO CP24: 120.0000 mg | ORAL_CAPSULE | Freq: Every day | ORAL | 1 refills | Status: DC

## 2020-02-23 NOTE — Patient Instructions (Signed)
Your physician recommends that you schedule a follow-up appointment in: [place] has recommended you make the following change in your medication:   DECREASE DILTIAZEM 120 MG DAILY   Thank you for choosing [facility]!!

## 2020-02-23 NOTE — Progress Notes (Signed)
Clinical Summary Ms. Hust is a 86 y.o.female  1. CAD - prior DES to LCX in 2015 - repeat caths 2017 and 2020 without obstructive disease other than ostial disease of a small RCA with recs for medical management - 10/2019 nuclear stress: no ischemia  - no recent chest pains  2. NSVT - on toprol, has been followed by EP Dr Lovena Le  - no recent palpitations  3. Chronic diastolic HF - no edema, weights are stable  4. Persistent afib - Recent TEE/DCCV performed 11/10/2019 with 3 shocks resulting in normal sinus rhythm - from notes does better breathing wise in SR  5. CKD III  6. Melena/microcytic anemia - followed by GI - recent admit to [facility] 01/2020, Hgb 6.7 - some ongoing bleeding intermittently  7. Dysphagia - followed by GI  8. SOB - no recent LE edema - mainly with activities - some wehezing at times, some cough. Not productive - uses nebulizer twice a day.    Past Medical History:  Diagnosis Date  . Anxiety   . Arthritis   . Atrial fibrillation ([place])   . Bursitis    Left shoulder  . Cataract   . CHF (congestive heart failure) ([place])   . CKD (chronic kidney disease)    stage 3-4  . COPD (chronic obstructive pulmonary disease) ([place])   . Coronary atherosclerosis of native coronary artery    a. s/p DES to LCx in 04/2013 b. cath in 11/2015 showing patent stent with 20% prox-LAD and 80% ostial RCA stenosis for which medical management was recommended due to small artery size  . Depression   . Diastolic heart failure (HCC)    EF 55-60%  . Dysphagia, unspecified(787.20)   . Dyspnea   . Dysrhythmia   . Essential hypertension   . GERD (gastroesophageal reflux disease)    Hx Schatzki's ring, multiple EGD/ED last 01/06/2004  . Gout   . Headache   . History of anemia   . Hyperlipidemia   . Internal hemorrhoids without mention of complication   . MI (myocardial infarction) ([place]) 2006  . Microscopic colitis 2003  . Panic disorder without  agoraphobia   . Paresthesia   . Pneumonia 12/2011  . PVD (peripheral vascular disease) ([place])   . S/P colonoscopy 09/27/2001   internal hemorrhoids, desc colon inflam polyp, SB BX-chronic duodenitis, colitis  . Sleep apnea   . Thyroid disease      Allergies  Allergen Reactions  . Cephalosporins Diarrhea and Nausea Only    Lightheaded  . Levaquin [Levofloxacin In D5w] Swelling  . Macrodantin [Nitrofurantoin Macrocrystal] Swelling  . Phenothiazines Anaphylaxis and Hives  . Polysorbate Anaphylaxis  . Prednisone Shortness Of Breath  . Buspirone Itching  . Cardura [Doxazosin Mesylate] Itching  . Codeine Itching  . Acyclovir And Related Itching    Redness of skin  . Colcrys [Colchicine] Nausea Only  . Prochlorperazine Other (See Comments)    "Upset stomach"  . Ranexa [Ranolazine]     Severe drop in BP  . Atorvastatin Hives    Cramping; tolerates Crestor ok  . Ofloxacin Rash  . Other Itching and Rash    "WOOL"= make skin look like it has been burned  . Penicillins Other (See Comments)    Causes redness all over. Has patient had a PCN reaction causing immediate rash, facial/tongue/throat swelling, SOB or lightheadedness with hypotension: No Has patient had a PCN reaction causing severe rash involving mucus membranes or skin necrosis: No Has patient  had a PCN reaction that required hospitalization No Has patient had a PCN reaction occurring within the last 10 years: No If all of the above answers are "NO", then may proceed with Cephalosporin use.   . Pimozide Hives and Itching     Current Outpatient Medications  Medication Sig Dispense Refill  . acetaminophen (TYLENOL) 500 MG tablet Take 500 mg by mouth every 6 (six) hours as needed for headache.     . ALPRAZolam (XANAX) 0.5 MG tablet Take 0.5 mg by mouth as needed.     [name] Kitchen apixaban (ELIQUIS) 5 MG TABS tablet Take 1 tablet (5 mg total) by mouth 2 (two) times daily. 180 tablet 3  . diltiazem (CARDIZEM CD) 180 MG 24 hr capsule  Take 1 capsule (180 mg total) by mouth daily. 30 capsule 1  . DULoxetine (CYMBALTA) 60 MG capsule Take 60 mg by mouth at bedtime.     . fluticasone (CUTIVATE) 0.05 % cream Apply 1 application topically 2 (two) times daily.     . fluticasone (FLONASE) 50 MCG/ACT nasal spray Place 2 sprays into both nostrils 2 (two) times daily as needed for allergies.     . furosemide (LASIX) 40 MG tablet Take 1 tablet (40 mg total) by mouth daily. 30 tablet 1  . hydrocortisone (ANUSOL-HC) 2.5 % rectal cream Place 1 application rectally 2 (two) times daily. 30 g 1  . ipratropium-albuterol (DUONEB) 0.5-2.5 (3) MG/3ML SOLN Take 3 mLs by nebulization every 6 (six) hours. 360 mL 0  . isosorbide mononitrate (IMDUR) 60 MG 24 hr tablet Take 1 tablet (60 mg total) by mouth daily. 90 tablet 3  . levothyroxine (SYNTHROID, LEVOTHROID) 25 MCG tablet Take 1 tablet by mouth daily.     . magnesium oxide (MAG-OX) 400 MG tablet TAKE ONE TABLET BY MOUTH DAILY. 90 tablet 3  . methocarbamol (ROBAXIN) 500 MG tablet Take 500 mg by mouth every 6 (six) hours as needed for muscle spasms.     . metoprolol succinate (TOPROL-XL) 25 MG 24 hr tablet Take 25 mg by mouth daily. Takes with the 50mg  tablet to equal 75mg  total for the day.    . metoprolol succinate (TOPROL-XL) 50 MG 24 hr tablet Take 50 mg by mouth daily. Takes with the 25mg  tablet to equal 75mg  total for the day.    . Multiple Vitamin (MULTIVITAMIN WITH MINERALS) TABS tablet Take 1 tablet by mouth daily. Centrum     . nitroGLYCERIN (NITROSTAT) 0.4 MG SL tablet Place 1 tablet (0.4 mg total) under the tongue every 5 (five) minutes as needed for chest pain. Reported on 08/04/2015 25 tablet 6  . pantoprazole (PROTONIX) 40 MG tablet Take 1 tablet (40 mg total) by mouth 2 (two) times daily before a meal. 60 tablet 3  . potassium chloride (KLOR-CON) 10 MEQ tablet Take 1 tablet (10 mEq total) by mouth daily. 180 tablet 1  . sodium chloride (OCEAN) 0.65 % SOLN nasal spray Place 1 spray into  both nostrils as needed (allergies).     . sucralfate (CARAFATE) 1 GM/10ML suspension Take 10 mLs (1 g total) by mouth 4 (four) times daily -  with meals and at bedtime. 420 mL 2  . traMADol (ULTRAM) 50 MG tablet Take 50 mg by mouth every 6 (six) hours as needed for pain.     No current facility-administered medications for this visit.     Past Surgical History:  Procedure Laterality Date  . ABDOMINAL HYSTERECTOMY    . ABDOMINAL HYSTERECTOMY    .  ANTERIOR AND POSTERIOR REPAIR     with resection of vagina  . ANTERIOR LAT LUMBAR FUSION N/A 08/01/2016   Procedure: Lumbar Two-Lumbar Five Transpsoas lateral interbody fusion with Lumbar Two-Three lateral plate fixation;  Surgeon: Kevan Ny Ditty, MD;  Location: [place];  Service: Neurosurgery;  Laterality: N/A;  L2-5 Transpsoas lateral interbody fusion with L2-3 lateral plate fixation  . APPENDECTOMY    . BACK SURGERY    . BIOPSY  07/05/2015   Procedure: BIOPSY;  Surgeon: Daneil Dolin, MD;  [facility];  Service: Endoscopy;;  gastric polyp biopsy, ascending colon biopsy  . BIOPSY  02/16/2020   Procedure: BIOPSY;  Surgeon: Daneil Dolin, MD;  [facility];  Service: Endoscopy;;  . BLADDER SUSPENSION  11/09/2011   Procedure: TRANSVAGINAL TAPE (TVT) PROCEDURE;  Surgeon: Marissa Nestle, MD;  Location: [facility];  Service: Urology;  Laterality: N/A;  . bladder tack  06/2010  . BREAST LUMPECTOMY  1998   left, benign  . CARDIAC CATHETERIZATION    . CARDIAC CATHETERIZATION    . CARDIAC CATHETERIZATION N/A 12/16/2015   Procedure: Left Heart Cath and Coronary Angiography;  Surgeon: Troy Sine, MD;  Location: [place] [facility];  Service: Cardiovascular;  Laterality: N/A;  . CARDIOVERSION N/A 10/04/2017   Procedure: CARDIOVERSION;  Surgeon: Herminio Commons, MD;  [facility]: [facility];  Service: Cardiovascular;  Laterality: N/A;  . CARDIOVERSION N/A 01/30/2018   Procedure: CARDIOVERSION;  Surgeon: Herminio Commons, MD;   [facility];  Service: Cardiovascular;  Laterality: N/A;  . CARDIOVERSION N/A 11/10/2019   Procedure: CARDIOVERSION;  Surgeon: Josue Hector, MD;  Location: [place];  Service: Cardiovascular;  Laterality: N/A;  . [place]   left  . cataract surgery    . CHOLECYSTECTOMY  1998  . Cholecystectomy    . COLONOSCOPY  03/16/2011   multiple hyperplastic colon polyps, sigmoid diverticulosis, melanosis coli  . COLONOSCOPY WITH PROPOFOL N/A 07/05/2015   RMR:one 5 mm polyp in descending colon  . CORONARY ANGIOGRAPHY N/A 05/16/2018   Procedure: CORONARY ANGIOGRAPHY (CATH LAB);  Surgeon: Belva Crome, MD;  Location: [place] [facility];  Service: Cardiovascular;  Laterality: N/A;  . CORONARY ANGIOPLASTY WITH STENT PLACEMENT  2015  . ESOPHAGEAL DILATION N/A 07/05/2015   Procedure: ESOPHAGEAL DILATION;  Surgeon: Daneil Dolin, MD;  [facility];  Service: Endoscopy;  Laterality: N/A;  . ESOPHAGOGASTRODUODENOSCOPY (EGD) WITH PROPOFOL N/A 07/05/2015   UEA:[id number]  . ESOPHAGOGASTRODUODENOSCOPY (EGD) WITH PROPOFOL N/A 02/16/2020   Procedure: ESOPHAGOGASTRODUODENOSCOPY (EGD) WITH PROPOFOL;  Surgeon: Daneil Dolin, MD;  [facility];  Service: Endoscopy;  Laterality: N/A;  . JOINT REPLACEMENT Right 2007   right knee  . left hand surgery    . LEFT HEART CATHETERIZATION WITH CORONARY ANGIOGRAM N/A 05/14/2013   Procedure: LEFT HEART CATHETERIZATION WITH CORONARY ANGIOGRAM;  Surgeon: Blane Ohara, MD;  Location: [facility];  Service: Cardiovascular;  Laterality: N/A;  . left rotator cuff surgery    . LUMBAR LAMINECTOMY/DECOMPRESSION MICRODISCECTOMY N/A 10/11/2012   Procedure: LUMBAR LAMINECTOMY/DECOMPRESSION MICRODISCECTOMY 2 LEVELS;  Surgeon: Floyce Stakes, MD;  Location: [place] [facility];  Service: Neurosurgery;  Laterality: N/A;  L3-4 L4-5 Laminectomy  . LUMBAR WOUND DEBRIDEMENT N/A 09/27/2015   Procedure: Exploration of Lumbar Wound w/ Repair CSF  Leak/Lumbar Drain Placement;  Surgeon: Leeroy Cha, MD;  Location: [place] [facility];  Service: Neurosurgery;  Laterality: N/A;  . MALONEY DILATION  03/16/2011   Gastritis. No H.pylori on bx. 69F maloney dilation with disruption of  occult cevical esophageal web  . MALONEY DILATION N/A 02/16/2020   Procedure: Venia Minks DILATION;  Surgeon: Daneil Dolin, MD;  [facility];  Service: Endoscopy;  Laterality: N/A;  . NASAL SINUS SURGERY    . right knee replacement  2007  . right leg benign tumor    . SHOULDER SURGERY Left   . TEE WITHOUT CARDIOVERSION N/A 10/04/2017   Procedure: TRANSESOPHAGEAL ECHOCARDIOGRAM (TEE) WITH PROPOFOL;  Surgeon: Herminio Commons, MD;  [facility];  Service: Cardiovascular;  Laterality: N/A;  . TEE WITHOUT CARDIOVERSION N/A 11/10/2019   Procedure: TRANSESOPHAGEAL ECHOCARDIOGRAM (TEE);  Surgeon: Josue Hector, MD;  Location: [facility] END[facility];  Service: Cardiovascular;  Laterality: N/A;  . TONSILLECTOMY    . unspecified area, hysterectomy  1972   partial     Allergies  Allergen Reactions  . Cephalosporins Diarrhea and Nausea Only    Lightheaded  . Levaquin [Levofloxacin In D5w] Swelling  . Macrodantin [Nitrofurantoin Macrocrystal] Swelling  . Phenothiazines Anaphylaxis and Hives  . Polysorbate Anaphylaxis  . Prednisone Shortness Of Breath  . Buspirone Itching  . Cardura [Doxazosin Mesylate] Itching  . Codeine Itching  . Acyclovir And Related Itching    Redness of skin  . Colcrys [Colchicine] Nausea Only  . Prochlorperazine Other (See Comments)    "Upset stomach"  . Ranexa [Ranolazine]     Severe drop in BP  . Atorvastatin Hives    Cramping; tolerates Crestor ok  . Ofloxacin Rash  . Other Itching and Rash    "WOOL"= make skin look like it has been burned  . Penicillins Other (See Comments)    Causes redness all over. Has patient had a PCN reaction causing immediate rash, facial/tongue/throat swelling, SOB or lightheadedness with  hypotension: No Has patient had a PCN reaction causing severe rash involving mucus membranes or skin necrosis: No Has patient had a PCN reaction that required hospitalization No Has patient had a PCN reaction occurring within the last 10 years: No If all of the above answers are "NO", then may proceed with Cephalosporin use.   . Pimozide Hives and Itching      Family History  Problem Relation Age of Onset  . Stroke Mother   . Parkinson's disease Father   . Coronary artery disease Other        family Hx-sons  . Cancer Other   . Stroke Other        family Hx  . Hypertension Other        family Hx  . Diabetes Brother   . Heart disease Son        before age 66  . Diabetes Son   . Stroke Daughter 19  . Colon cancer Neg Hx      Social History Ms. Maxton reports that she quit smoking about 18 years ago. Her smoking use included cigarettes. She started smoking about 72 years ago. She has a 64.00 pack-year smoking history. She has never used smokeless tobacco. Ms. Jolley reports current alcohol use.   Review of Systems CONSTITUTIONAL: No weight loss, fever, chills, weakness or fatigue.  HEENT: Eyes: No visual loss, blurred vision, double vision or yellow sclerae.No hearing loss, sneezing, congestion, runny nose or sore throat.  SKIN: No rash or itching.  CARDIOVASCULAR: per hpi RESPIRATORY: No shortness of breath, cough or sputum.  GASTROINTESTINAL: No anorexia, nausea, vomiting or diarrhea. No abdominal pain or blood.  GENITOURINARY: No burning on urination, no polyuria NEUROLOGICAL: No headache, dizziness, syncope, paralysis, ataxia, numbness or tingling in the extremities. No change in bowel or bladder control.  MUSCULOSKELETAL: No muscle, back pain, joint pain or stiffness.  LYMPHATICS: No enlarged nodes. No history of splenectomy.  PSYCHIATRIC: No history of depression or anxiety.  ENDOCRINOLOGIC: No reports of sweating, cold or heat intolerance. No polyuria or polydipsia.   [name] Kitchen   Physical Examination Today's Vitals   02/23/20 1040  BP: 140/70  Pulse: 71  SpO2: 95%  Weight: 156 lb 9.6 oz (71 kg)  Height: 5\' 1"  (1.549 m)   Body mass index is 29.59 kg/m.  Gen: resting comfortably, no acute distress HEENT: no scleral icterus, pupils equal round and reactive, no palptable cervical adenopathy,  CV: brady, no m/r/g, no jvd Resp: Clear to auscultation bilaterally GI: abdomen is soft, non-tender, non-distended, normal bowel sounds, no hepatosplenomegaly MSK: extremities are warm, no edema.  Skin: warm, no rash Neuro:  no focal deficits Psych: appropriate affect   Diagnostic Studies Cardiac Catheterization: May 18, 2018  Patent short left main  20 to 30% proximal LAD irregularity, with diffuse 20% narrowing within the mid vessel.  Patent proximal circumflex stent with mid to distal circumflex irregularities up to 30 to 40%.  Nondominant right coronary with ostial 90% narrowing.  No hemodynamics recorded. Left ventriculography was not performed in an effort to decrease contrast exposure in the setting of CKD.  RECOMMENDATIONS:   Compared to prior angiography in 2017, no significant changes noted.  Chest pain is atypical and may be more musculoskeletal or pleuritic.  Resume Eliquis in 12 hours/a.m. of 05/17/2018  Echocardiogram 10/02/18:  1. The left ventricle has normal systolic function with an ejection  fraction of 60-65%. The cavity size was normal. There is mildly increased  left ventricular wall thickness. Left ventricular diastolic Doppler  parameters are consistent with impaired  relaxation. Elevated mean left atrial pressure.  2. The right ventricle has normal systolic function. The cavity was  normal. There is no increase in right ventricular wall thickness.  3. Left atrial size was mildly dilated.  4. The aortic valve is tricuspid. Mild thickening of the aortic valve.  Mild calcification of the aortic valve. Aortic valve  regurgitation is mild  by color flow Doppler. No stenosis of the aortic valve. Mild aortic  annular calcification noted.  5. The mitral valve is abnormal. Mild thickening of the mitral valve  leaflet. Mild calcification of the mitral valve leaflet. There is mild  mitral annular calcification present. No evidence of mitral valve  stenosis.  6. The aortic root is normal in size and structure.  7. Pulmonary hypertension is indeterminant, inadequate TR jet.    Event monitor 05/13/19:  1. NSR  2. NSVT, lasting upto 6 beats 3. No prolonged pauses or bradycardia 4. No atrial fib   09/2019 echo 1. Left ventricular ejection fraction, by estimation, is 60 to 65%. The  left ventricle has normal function. The left ventricle has no regional  wall motion abnormalities. There is mild left ventricular hypertrophy.  Left ventricular diastolic parameters  are indeterminate.  2. Right ventricular systolic function is normal. The right ventricular  size is normal. There is normal pulmonary artery systolic pressure.  3. The mitral valve is normal in structure. Trivial mitral valve  regurgitation. No evidence of mitral stenosis.  4. The aortic valve was not well visualized. Aortic valve regurgitation  is mild. No aortic stenosis is present.     Assessment and  Plan  1. CAD - no chest pains, recent nuclear stress without ischemia - continue to monitor  2. Chronic diastolic HF - appears euvolemic, continue current therapy  3. SOB - Hgb was normal 2 weeks ago, appears euvolemic. Recent negative nuclear stress - encouraged increase use of home nebulizer given some wheezing at times - some bradcardia today by EKG, lower dilt to 120mg  daily, perhaps contributing to exertional symptoms  4. Afib - EKG shows SR with sinus pauses, HR 47 - dynamap and EKG rate not at all similar, would repeat EKG at follow up to reasses rates - lower dilt to 120mg  daily.      Arnoldo Lenis, M.D

## 2020-02-25 DIAGNOSIS — Z9049 Acquired absence of other specified parts of digestive tract: Secondary | ICD-10-CM | POA: Diagnosis not present

## 2020-02-25 DIAGNOSIS — L89329 Pressure ulcer of left buttock, unspecified stage: Secondary | ICD-10-CM | POA: Diagnosis not present

## 2020-02-25 DIAGNOSIS — J449 Chronic obstructive pulmonary disease, unspecified: Secondary | ICD-10-CM | POA: Diagnosis not present

## 2020-02-25 DIAGNOSIS — N189 Chronic kidney disease, unspecified: Secondary | ICD-10-CM | POA: Diagnosis not present

## 2020-02-25 DIAGNOSIS — Z90710 Acquired absence of both cervix and uterus: Secondary | ICD-10-CM | POA: Diagnosis not present

## 2020-02-25 DIAGNOSIS — I130 Hypertensive heart and chronic kidney disease with heart failure and stage 1 through stage 4 chronic kidney disease, or unspecified chronic kidney disease: Secondary | ICD-10-CM | POA: Diagnosis not present

## 2020-02-25 DIAGNOSIS — L89322 Pressure ulcer of left buttock, stage 2: Secondary | ICD-10-CM | POA: Diagnosis not present

## 2020-03-01 DIAGNOSIS — J449 Chronic obstructive pulmonary disease, unspecified: Secondary | ICD-10-CM | POA: Diagnosis not present

## 2020-03-03 DIAGNOSIS — I739 Peripheral vascular disease, unspecified: Secondary | ICD-10-CM | POA: Diagnosis not present

## 2020-03-03 DIAGNOSIS — J449 Chronic obstructive pulmonary disease, unspecified: Secondary | ICD-10-CM | POA: Diagnosis not present

## 2020-03-03 DIAGNOSIS — N183 Chronic kidney disease, stage 3 unspecified: Secondary | ICD-10-CM | POA: Diagnosis not present

## 2020-03-03 DIAGNOSIS — L89893 Pressure ulcer of other site, stage 3: Secondary | ICD-10-CM | POA: Diagnosis not present

## 2020-03-03 DIAGNOSIS — I5033 Acute on chronic diastolic (congestive) heart failure: Secondary | ICD-10-CM | POA: Diagnosis not present

## 2020-03-03 DIAGNOSIS — I130 Hypertensive heart and chronic kidney disease with heart failure and stage 1 through stage 4 chronic kidney disease, or unspecified chronic kidney disease: Secondary | ICD-10-CM | POA: Diagnosis not present

## 2020-03-03 DIAGNOSIS — I482 Chronic atrial fibrillation, unspecified: Secondary | ICD-10-CM | POA: Diagnosis not present

## 2020-03-16 DIAGNOSIS — N1832 Chronic kidney disease, stage 3b: Secondary | ICD-10-CM | POA: Diagnosis not present

## 2020-03-16 DIAGNOSIS — I130 Hypertensive heart and chronic kidney disease with heart failure and stage 1 through stage 4 chronic kidney disease, or unspecified chronic kidney disease: Secondary | ICD-10-CM | POA: Diagnosis not present

## 2020-03-16 DIAGNOSIS — I5033 Acute on chronic diastolic (congestive) heart failure: Secondary | ICD-10-CM | POA: Diagnosis not present

## 2020-03-24 DIAGNOSIS — R202 Paresthesia of skin: Secondary | ICD-10-CM | POA: Diagnosis not present

## 2020-03-24 DIAGNOSIS — R69 Illness, unspecified: Secondary | ICD-10-CM | POA: Diagnosis not present

## 2020-03-24 DIAGNOSIS — Z880 Allergy status to penicillin: Secondary | ICD-10-CM | POA: Diagnosis not present

## 2020-03-24 DIAGNOSIS — R197 Diarrhea, unspecified: Secondary | ICD-10-CM | POA: Diagnosis not present

## 2020-03-24 DIAGNOSIS — R58 Hemorrhage, not elsewhere classified: Secondary | ICD-10-CM | POA: Diagnosis not present

## 2020-03-24 DIAGNOSIS — G8929 Other chronic pain: Secondary | ICD-10-CM | POA: Diagnosis not present

## 2020-03-24 DIAGNOSIS — J449 Chronic obstructive pulmonary disease, unspecified: Secondary | ICD-10-CM | POA: Diagnosis not present

## 2020-03-24 DIAGNOSIS — W19XXXA Unspecified fall, initial encounter: Secondary | ICD-10-CM | POA: Diagnosis not present

## 2020-03-24 DIAGNOSIS — W1839XA Other fall on same level, initial encounter: Secondary | ICD-10-CM | POA: Diagnosis not present

## 2020-03-24 DIAGNOSIS — L89322 Pressure ulcer of left buttock, stage 2: Secondary | ICD-10-CM | POA: Diagnosis not present

## 2020-03-24 DIAGNOSIS — Z79899 Other long term (current) drug therapy: Secondary | ICD-10-CM | POA: Diagnosis not present

## 2020-03-24 DIAGNOSIS — I4891 Unspecified atrial fibrillation: Secondary | ICD-10-CM | POA: Diagnosis not present

## 2020-03-24 DIAGNOSIS — M549 Dorsalgia, unspecified: Secondary | ICD-10-CM | POA: Diagnosis not present

## 2020-03-24 DIAGNOSIS — S0990XA Unspecified injury of head, initial encounter: Secondary | ICD-10-CM | POA: Diagnosis not present

## 2020-03-24 DIAGNOSIS — I10 Essential (primary) hypertension: Secondary | ICD-10-CM | POA: Diagnosis not present

## 2020-03-24 DIAGNOSIS — M25551 Pain in right hip: Secondary | ICD-10-CM | POA: Diagnosis not present

## 2020-03-24 DIAGNOSIS — S0993XA Unspecified injury of face, initial encounter: Secondary | ICD-10-CM | POA: Diagnosis not present

## 2020-03-24 DIAGNOSIS — Z885 Allergy status to narcotic agent status: Secondary | ICD-10-CM | POA: Diagnosis not present

## 2020-03-24 DIAGNOSIS — I509 Heart failure, unspecified: Secondary | ICD-10-CM | POA: Diagnosis not present

## 2020-03-24 DIAGNOSIS — S0083XA Contusion of other part of head, initial encounter: Secondary | ICD-10-CM | POA: Diagnosis not present

## 2020-03-24 DIAGNOSIS — Z881 Allergy status to other antibiotic agents status: Secondary | ICD-10-CM | POA: Diagnosis not present

## 2020-03-24 DIAGNOSIS — I739 Peripheral vascular disease, unspecified: Secondary | ICD-10-CM | POA: Diagnosis not present

## 2020-03-24 DIAGNOSIS — Z043 Encounter for examination and observation following other accident: Secondary | ICD-10-CM | POA: Diagnosis not present

## 2020-03-24 DIAGNOSIS — I130 Hypertensive heart and chronic kidney disease with heart failure and stage 1 through stage 4 chronic kidney disease, or unspecified chronic kidney disease: Secondary | ICD-10-CM | POA: Diagnosis not present

## 2020-03-30 NOTE — Progress Notes (Signed)
Referring Provider: Rosalee Kaufman, * Primary Care Physician:  Rosalee Kaufman, PA-C Primary GI Physician: Dr. Gala Romney  Chief Complaint  Patient presents with  . Hemorrhoids    Fu pp, diarrhea for 7 days off and on, rectal bleeding    HPI:   [name] is a 86 y.o. female presenting today for follow-up anemia, rectal bleeding, dark stools, GERD, dysphagia/odynophagia , and chronic intermittent diarrhea s/p EGD. History of GERD, dysphagia, chronic nausea, and low-volume hematochezia in the setting of chronic anticoagulation. Colonoscopy in 2017 for chronic diarrhea which found a single tubular adenoma, otherwise normal exam, random colon biopsies benign. No recommendations to repeat colonoscopy.   She was last seen in our office 12/12/2019.  Noted decline in hemoglobin over the last year from 12 range in 2020 down to 9.2 in July 2021 with ferritin 12, iron 22, saturation 5%.  Continued with chronic intermittent low-volume BRBPR in the setting of hemorrhoids but had new onset of dark/black stools x2 months.  Also with uncontrolled GERD x1 week, dysphagia that has been progressive over the last several months, and odynophagia on daily PPI.  Denied NSAIDs or abdominal pain.  Chronic intermittent diarrhea that was unchanged and likely secondary lactose intolerance.  Discussed need for upper and lower endoscopic evaluation in the setting of iron deficiency.  Patient declined colonoscopy but was agreeable to EGD.  Labs were also ordered, Protonix increased to twice daily, Carafate added 3 times daily before meals and at bedtime, Anusol rectal cream twice daily x7 days.  Repeat labs with hemoglobin 9.6 on 12/12/2019.  Ferritin remained at 12, saturation down to 4%, and iron 21.  We received a call from Dr. Milagros Reap, ED physician at [facility] on 02/09/2020.  Patient presented with symptomatic anemia with hemoglobin 6.7, no overt GI bleeding while hospitalized and stool was heme-negative.   She received 2 units PRBCs with posttransfusion hemoglobin up to 11.1.  Preop labs 02/12/2020 with hemoglobin 11.5. EGD 02/16/2020: Normal examined esophagus s/p dilation, erythematous mucosa in the stomach s/p biopsy, normal examined duodenum. Pathology with mild gastropathy, negative for H. Pylori.   Most recent labs in [place] 03/24/2020 with hemoglobin 10.8.  Today:  GERD: Much better/resolved with Protonix 40 mg BID.  Dysphagia: Resolved.   Diarrhea: Everything she eats goes straight through her. This started 3 weeks ago. Intermittent. Up to 9 BMs a day for a couple of days, resolve for a couple of days, then return again. States it is explosive. Stools are liquid. Rectal bleeding became more frequent and has been increasing in volume over the last 4 weeks. On Eliquis. Waking in the middle of the night to have a BM. Associated abdominal pain in RLQ and suprapubic area before BMs, will hurt for 1/2 an hour or more. Tried imodium with no help. No dairy products typically. She did eat ice cream 2 days ago. No N/V. Weight is stable.   Took bactrim for UTI about 1 month ago. Brief hospitalization in October for 2 days in October. No sick contacts. Drinks bottled water.   Also states she has had several black BMs. She was taking iron daily for the last 6 weeks.   [place] recently.  Has lightheadedness, dizziness at times when standing.  Feels fatigued.  Does not have much energy. Intermittent chest pain which is chronic, no change recently. Chronically short of breath without change. Chronic cough. Has nasal drainage.   Last took levothyroxine about 1 month ago. Not able to  afford it.  PCP is working on this.  No NSAIDs.   Past Medical History:  Diagnosis Date  . Anxiety   . Arthritis   . Atrial fibrillation ([place])   . Bursitis    Left shoulder  . Cataract   . CHF (congestive heart failure) ([place])   . CKD (chronic kidney disease)    stage 3-4  . COPD (chronic obstructive  pulmonary disease) ([place])   . Coronary atherosclerosis of native coronary artery    a. s/p DES to LCx in 04/2013 b. cath in 11/2015 showing patent stent with 20% prox-LAD and 80% ostial RCA stenosis for which medical management was recommended due to small artery size  . Depression   . Diastolic heart failure (HCC)    EF 55-60%  . Dysphagia, unspecified(787.20)   . Dyspnea   . Dysrhythmia   . Essential hypertension   . GERD (gastroesophageal reflux disease)    Hx Schatzki's ring, multiple EGD/ED last 01/06/2004  . Gout   . Headache   . History of anemia   . Hyperlipidemia   . Internal hemorrhoids without mention of complication   . MI (myocardial infarction) ([place]) 2006  . Microscopic colitis 2003  . Panic disorder without agoraphobia   . Paresthesia   . Pneumonia 12/2011  . PVD (peripheral vascular disease) ([place])   . S/P colonoscopy 09/27/2001   internal hemorrhoids, desc colon inflam polyp, SB BX-chronic duodenitis, colitis  . Sleep apnea   . Thyroid disease     Past Surgical History:  Procedure Laterality Date  . ABDOMINAL HYSTERECTOMY    . ABDOMINAL HYSTERECTOMY    . ANTERIOR AND POSTERIOR REPAIR     with resection of vagina  . ANTERIOR LAT LUMBAR FUSION N/A 08/01/2016   Procedure: Lumbar Two-Lumbar Five Transpsoas lateral interbody fusion with Lumbar Two-Three lateral plate fixation;  Surgeon: Kevan Ny Ditty, MD;  Location: [facility];  Service: Neurosurgery;  Laterality: N/A;  L2-5 Transpsoas lateral interbody fusion with L2-3 lateral plate fixation  . APPENDECTOMY    . BACK SURGERY    . BIOPSY  07/05/2015   Procedure: BIOPSY;  Surgeon: Daneil Dolin, MD;  [facility];  Service: Endoscopy;;  gastric polyp biopsy, ascending colon biopsy  . BIOPSY  02/16/2020   Procedure: BIOPSY;  Surgeon: Daneil Dolin, MD;  [facility];  Service: Endoscopy;;  . BLADDER SUSPENSION  11/09/2011   Procedure: TRANSVAGINAL TAPE (TVT) PROCEDURE;  Surgeon: Marissa Nestle, MD;  Location: [facility];  Service: Urology;  Laterality: N/A;  . bladder tack  06/2010  . BREAST LUMPECTOMY  1998   left, benign  . CARDIAC CATHETERIZATION    . CARDIAC CATHETERIZATION    . CARDIAC CATHETERIZATION N/A 12/16/2015   Procedure: Left Heart Cath and Coronary Angiography;  Surgeon: Troy Sine, MD;  Location: [facility] CV LAB;  Service: Cardiovascular;  Laterality: N/A;  . CARDIOVERSION N/A 10/04/2017   Procedure: CARDIOVERSION;  Surgeon: Herminio Commons, MD;  [facility]: [facility];  Service: Cardiovascular;  Laterality: N/A;  . CARDIOVERSION N/A 01/30/2018   Procedure: CARDIOVERSION;  Surgeon: Herminio Commons, MD;  [facility];  Service: Cardiovascular;  Laterality: N/A;  . CARDIOVERSION N/A 11/10/2019   Procedure: CARDIOVERSION;  Surgeon: Josue Hector, MD;  Location: [facility];  Service: Cardiovascular;  Laterality: N/A;  . [place]   left  . cataract surgery    . CHOLECYSTECTOMY  1998  . Cholecystectomy    .  COLONOSCOPY  03/16/2011   multiple hyperplastic colon polyps, sigmoid diverticulosis, melanosis coli  . COLONOSCOPY WITH PROPOFOL N/A 07/05/2015   RMR:one 5 mm polyp in descending colon  . CORONARY ANGIOGRAPHY N/A 05/16/2018   Procedure: CORONARY ANGIOGRAPHY (CATH LAB);  Surgeon: Belva Crome, MD;  Location: [facility] CV LAB;  Service: Cardiovascular;  Laterality: N/A;  . CORONARY ANGIOPLASTY WITH STENT PLACEMENT  2015  . ESOPHAGEAL DILATION N/A 07/05/2015   Procedure: ESOPHAGEAL DILATION;  Surgeon: Daneil Dolin, MD;  [facility];  Service: Endoscopy;  Laterality: N/A;  . ESOPHAGOGASTRODUODENOSCOPY (EGD) WITH PROPOFOL N/A 07/05/2015   JOA:CZYSAY  . ESOPHAGOGASTRODUODENOSCOPY (EGD) WITH PROPOFOL N/A 02/16/2020   Procedure: ESOPHAGOGASTRODUODENOSCOPY (EGD) WITH PROPOFOL;  Surgeon: Daneil Dolin, MD;  Normal examined esophagus s/p dilation, erythematous mucosa in the stomach s/p biopsy, normal examined  duodenum. Pathology with mild gastropathy, negative for H. Pylori.   [name] Kitchen JOINT REPLACEMENT Right 2007   right knee  . left hand surgery    . LEFT HEART CATHETERIZATION WITH CORONARY ANGIOGRAM N/A 05/14/2013   Procedure: LEFT HEART CATHETERIZATION WITH CORONARY ANGIOGRAM;  Surgeon: Blane Ohara, MD;  Location: [facility] CATH LAB;  Service: Cardiovascular;  Laterality: N/A;  . left rotator cuff surgery    . LUMBAR LAMINECTOMY/DECOMPRESSION MICRODISCECTOMY N/A 10/11/2012   Procedure: LUMBAR LAMINECTOMY/DECOMPRESSION MICRODISCECTOMY 2 LEVELS;  Surgeon: Floyce Stakes, MD;  Location: [facility] OR[facility];  Service: Neurosurgery;  Laterality: N/A;  L3-4 L4-5 Laminectomy  . LUMBAR WOUND DEBRIDEMENT N/A 09/27/2015   Procedure: Exploration of Lumbar Wound w/ Repair CSF Leak/Lumbar Drain Placement;  Surgeon: Leeroy Cha, MD;  Location: [facility] NEURO ORS;  Service: Neurosurgery;  Laterality: N/A;  . MALONEY DILATION  03/16/2011   Gastritis. No H.pylori on bx. 6F maloney dilation with disruption of  occult cevical esophageal web  . MALONEY DILATION N/A 02/16/2020   Procedure: Venia Minks DILATION;  Surgeon: Daneil Dolin, MD;  [facility];  Service: Endoscopy;  Laterality: N/A;  . NASAL SINUS SURGERY    . right knee replacement  2007  . right leg benign tumor    . SHOULDER SURGERY Left   . TEE WITHOUT CARDIOVERSION N/A 10/04/2017   Procedure: TRANSESOPHAGEAL ECHOCARDIOGRAM (TEE) WITH PROPOFOL;  Surgeon: Herminio Commons, MD;  [facility];  Service: Cardiovascular;  Laterality: N/A;  . TEE WITHOUT CARDIOVERSION N/A 11/10/2019   Procedure: TRANSESOPHAGEAL ECHOCARDIOGRAM (TEE);  Surgeon: Josue Hector, MD;  Location: [facility] ENDOSCOPY;  Service: Cardiovascular;  Laterality: N/A;  . TONSILLECTOMY    . unspecified area, hysterectomy  1972   partial    Current Outpatient Medications  Medication Sig Dispense Refill  . acetaminophen (TYLENOL) 500 MG tablet Take 500 mg by mouth every 6 (six) hours as needed  for headache.     . ALPRAZolam (XANAX) 0.5 MG tablet Take 0.5 mg by mouth as needed.     [name] Kitchen apixaban (ELIQUIS) 5 MG TABS tablet Take 1 tablet (5 mg total) by mouth 2 (two) times daily. 180 tablet 3  . diltiazem (CARDIZEM CD) 120 MG 24 hr capsule Take 1 capsule (120 mg total) by mouth daily. 90 capsule 1  . DULoxetine (CYMBALTA) 60 MG capsule Take 60 mg by mouth at bedtime.    . fluticasone (CUTIVATE) 0.05 % cream Apply 1 application topically 2 (two) times daily.     . fluticasone (FLONASE) 50 MCG/ACT nasal spray Place 2 sprays into both nostrils as needed for allergies.    . furosemide (LASIX) 40 MG  tablet Take 1 tablet (40 mg total) by mouth daily. (Patient taking differently: Take 40 mg by mouth daily. Takes 20 mg daily.) 30 tablet 1  . hydrocortisone (ANUSOL-HC) 2.5 % rectal cream Place 1 application rectally 2 (two) times daily. 30 g 1  . ipratropium-albuterol (DUONEB) 0.5-2.5 (3) MG/3ML SOLN Take 3 mLs by nebulization every 6 (six) hours. 360 mL 0  . isosorbide mononitrate (IMDUR) 60 MG 24 hr tablet Take 1 tablet (60 mg total) by mouth daily. 90 tablet 3  . magnesium oxide (MAG-OX) 400 MG tablet TAKE ONE TABLET BY MOUTH DAILY. 90 tablet 3  . metoprolol succinate (TOPROL-XL) 25 MG 24 hr tablet Take 25 mg by mouth daily. Takes with the 50mg  tablet to equal 75mg  total for the day.    . metoprolol succinate (TOPROL-XL) 50 MG 24 hr tablet Take 50 mg by mouth daily. Takes with the 25mg  tablet to equal 75mg  total for the day.    . Multiple Vitamin (MULTIVITAMIN WITH MINERALS) TABS tablet Take 1 tablet by mouth daily. Centrum    . nitroGLYCERIN (NITROSTAT) 0.4 MG SL tablet Place 1 tablet (0.4 mg total) under the tongue every 5 (five) minutes as needed for chest pain. Reported on 08/04/2015 25 tablet 6  . pantoprazole (PROTONIX) 40 MG tablet Take 1 tablet (40 mg total) by mouth 2 (two) times daily before a meal. 60 tablet 3  . potassium chloride (KLOR-CON) 10 MEQ tablet Take 1 tablet (10 mEq total) by  mouth daily. 180 tablet 1  . sodium chloride (OCEAN) 0.65 % SOLN nasal spray Place 1 spray into both nostrils as needed (allergies).     . traMADol (ULTRAM) 50 MG tablet Take 50 mg by mouth every 6 (six) hours as needed for pain.    [name] Kitchen levothyroxine (SYNTHROID, LEVOTHROID) 25 MCG tablet Take 1 tablet by mouth daily.  (Patient not taking: Reported on 03/31/2020)    . methocarbamol (ROBAXIN) 500 MG tablet Take 500 mg by mouth every 6 (six) hours as needed for muscle spasms.  (Patient not taking: Reported on 03/31/2020)     No current facility-administered medications for this visit.    Allergies as of 03/31/2020 - Review Complete 03/31/2020  Allergen Reaction Noted  . Cephalosporins Diarrhea and Nausea Only 08/04/2015  . Levaquin [levofloxacin in d5w] Swelling 07/06/2014  . Macrodantin [nitrofurantoin macrocrystal] Swelling 07/06/2014  . Phenothiazines Anaphylaxis and Hives 08/16/2011  . Polysorbate Anaphylaxis 08/16/2011  . Prednisone Shortness Of Breath   . Buspirone Itching 07/06/2014  . Cardura [doxazosin mesylate] Itching 07/06/2014  . Codeine Itching   . Acyclovir and related Itching 10/07/2015  . Colcrys [colchicine] Nausea Only 10/04/2018  . Prochlorperazine Other (See Comments) 08/16/2011  . Ranexa [ranolazine]  04/06/2015  . Atorvastatin Hives 08/16/2011  . Ofloxacin Rash   . Other Itching and Rash 10/10/2012  . Penicillins Other (See Comments) 11/09/2011  . Pimozide Hives and Itching 08/16/2011    Family History  Problem Relation Age of Onset  . Stroke Mother   . Parkinson's disease Father   . Coronary artery disease Other        family Hx-sons  . Cancer Other   . Stroke Other        family Hx  . Hypertension Other        family Hx  . Diabetes Brother   . Heart disease Son        before age 37  . Diabetes Son   . Stroke Daughter 35  . Colon cancer  Grandson        diagnosed 59  . Inflammatory bowel disease Neg Hx     Social History   Socioeconomic History   . Marital status: Divorced    Spouse name: Not on file  . Number of children: 5  . Years of education: Not on file  . Highest education level: Not on file  Occupational History  . Occupation: retired  Tobacco Use  . Smoking status: Former Smoker    Packs/day: 1.00    Years: 64.00    Pack years: 64.00    Types: Cigarettes    Start date: 12/24/1947    Quit date: 11/17/2001    Years since quitting: 18.3  . Smokeless tobacco: Never Used  . Tobacco comment: Quit smoking in 2003  Vaping Use  . Vaping Use: Never used  Substance and Sexual Activity  . Alcohol use: Yes    Alcohol/week: 0.0 standard drinks    Comment: only at new years.   . Drug use: No  . Sexual activity: Never  Other Topics Concern  . Not on file  Social History Narrative   Divorced.   Sister had colon perforation & died from complications in [place], [place]   Social Determinants of Health   Financial Resource Strain: Not on file  Food Insecurity: Not on file  Transportation Needs: Not on file  Physical Activity: Not on file  Stress: Not on file  Social Connections: Not on file    Review of Systems: Gen: See HPI CV: See HPI Resp: See HPI GI: See HPI Heme: See HPI  Physical Exam: BP (!) 126/51   Pulse (!) 55   Temp (!) 97.1 F (36.2 C) (Temporal)   Ht 5\' 1"  (1.549 m)   Wt 156 lb 3.2 oz (70.9 kg)   BMI 29.51 kg/m  General:   Alert and oriented. No distress noted. Pleasant and cooperative.  Head:  Normocephalic and atraumatic. Eyes:  Conjuctiva clear without scleral icterus. Heart:  S1, S2 present without murmurs appreciated. Lungs:  Clear to auscultation bilaterally. No wheezes, rales, or rhonchi. No distress.  Abdomen:  +BS, soft, and non-distended.  Very mild tenderness to palpation in the right lower quadrant. No rebound or guarding. No HSM or masses noted. Msk:  Symmetrical without gross deformities. Normal posture. Extremities:  Without edema. Neurologic:  Alert and  oriented x4 Psych:  Normal  mood and affect.

## 2020-03-31 ENCOUNTER — Other Ambulatory Visit (HOSPITAL_COMMUNITY)
Admission: RE | Admit: 2020-03-31 | Discharge: 2020-03-31 | Disposition: A | Discharge status: Home / Self Care | Payer: Medicare HMO | Source: Ambulatory Visit | Attending: Gastroenterology | Admitting: Gastroenterology

## 2020-03-31 ENCOUNTER — Other Ambulatory Visit: Payer: Self-pay

## 2020-03-31 ENCOUNTER — Encounter: Payer: Self-pay | Admitting: Gastroenterology

## 2020-03-31 ENCOUNTER — Ambulatory Visit (INDEPENDENT_AMBULATORY_CARE_PROVIDER_SITE_OTHER): Payer: Medicare HMO | Admitting: Gastroenterology

## 2020-03-31 VITALS — BP 126/51 | HR 55 | Temp 97.1°F | Ht 61.0 in | Wt 156.2 lb

## 2020-03-31 DIAGNOSIS — K625 Hemorrhage of anus and rectum: Secondary | ICD-10-CM

## 2020-03-31 DIAGNOSIS — R1310 Dysphagia, unspecified: Secondary | ICD-10-CM | POA: Diagnosis not present

## 2020-03-31 DIAGNOSIS — R197 Diarrhea, unspecified: Secondary | ICD-10-CM

## 2020-03-31 DIAGNOSIS — K219 Gastro-esophageal reflux disease without esophagitis: Secondary | ICD-10-CM | POA: Diagnosis not present

## 2020-03-31 DIAGNOSIS — J449 Chronic obstructive pulmonary disease, unspecified: Secondary | ICD-10-CM | POA: Diagnosis not present

## 2020-03-31 LAB — BASIC METABOLIC PANEL
Anion gap: 9 (ref 5–15)
BUN: 20 mg/dL (ref 8–23)
CO2: 25 mmol/L (ref 22–32)
Calcium: 8.9 mg/dL (ref 8.9–10.3)
Chloride: 100 mmol/L (ref 98–111)
Creatinine, Ser: 1.31 mg/dL — ABNORMAL HIGH (ref 0.44–1.00)
GFR, Estimated: 40 mL/min — ABNORMAL LOW (ref >60)
Glucose, Bld: 101 mg/dL — ABNORMAL HIGH (ref 70–99)
Potassium: 4.5 mmol/L (ref 3.5–5.1)
Sodium: 134 mmol/L — ABNORMAL LOW (ref 135–145)

## 2020-03-31 LAB — CBC WITH DIFFERENTIAL/PLATELET
Abs Immature Granulocytes: 0.06 10*3/uL (ref 0.00–0.07)
Basophils Absolute: 0.1 10*3/uL (ref 0.0–0.1)
Basophils Relative: 1 %
Eosinophils Absolute: 0.2 10*3/uL (ref 0.0–0.5)
Eosinophils Relative: 2 %
HCT: 35.8 % — ABNORMAL LOW (ref 36.0–46.0)
Hemoglobin: 10.9 g/dL — ABNORMAL LOW (ref 12.0–15.0)
Immature Granulocytes: 1 %
Lymphocytes Relative: 21 %
Lymphs Abs: 1.5 10*3/uL (ref 0.7–4.0)
MCH: 25.3 pg — ABNORMAL LOW (ref 26.0–34.0)
MCHC: 30.4 g/dL (ref 30.0–36.0)
MCV: 83.3 fL (ref 80.0–100.0)
Monocytes Absolute: 0.5 10*3/uL (ref 0.1–1.0)
Monocytes Relative: 7 %
Neutro Abs: 5 10*3/uL (ref 1.7–7.7)
Neutrophils Relative %: 68 %
Platelets: 354 10*3/uL (ref 150–400)
RBC: 4.3 MIL/uL (ref 3.87–5.11)
RDW: 22.9 % — ABNORMAL HIGH (ref 11.5–15.5)
WBC: 7.3 10*3/uL (ref 4.0–10.5)
nRBC: 0 % (ref 0.0–0.2)

## 2020-03-31 LAB — TSH: TSH: 1.903 u[IU]/mL (ref 0.350–4.500)

## 2020-03-31 NOTE — Patient Instructions (Addendum)
Please have labs and stool studies completed at [facility].   We will call you with results and further recommendations.   If you have any worsening symptoms, profuse rectal bleeding, or feel like you will pass out, you should proceed to the emergency room.   Continue Protonix 40 mg twice daily.   You may stop carafate.   Aliene Altes, PA-C [facility]

## 2020-03-31 NOTE — Assessment & Plan Note (Signed)
Chronic. Patient experienced a significant GERD flare in August. Protonix was increased to BID and GERD is now well controlled. We will continue Protonix 40 mg BID for now. Consider reducing back to once daily in the future.

## 2020-03-31 NOTE — Assessment & Plan Note (Addendum)
86 y.o. female with history of intermittent diarrhea in the setting of lactose intolerance and intermittent toilet tissue hematochezia in the setting of Eliquis; however, patient reports 4 weeks of worsening rectal bleeding along with watery diarrhea (up to 9 BMs per day and nocturnal BMs) and intermittent abdominal pain in the RLQ prior to BMs. Also reporting intermittent black stools, but she has also been on oral iron recently. EGD was just performed 02/16/2020 with normal examined esophagus s/p dilation, erythematous mucosa in the stomach s/p biopsy (mild gastropathy, negative H. Pylori), normal examined duodenum. Notably, she has had declining hemoglobin over the last year with iron deficiency. Hemoglobin as low as 6.7 in October 2021 requiring 2 units PRBCs. Last hemoglobin 03/24/20 stable at 10.8. Last colonoscopy in 2017 for chronic diarrhea which found a single tubular adenoma, otherwise normal exam, random colon biopsies benign. Recent risk for infectious diarrhea include recent antibiotics for UTI and brief hospitalization in October.  Rectal bleeding may be secondary to an infectious colitis, colon polyps, or malignancy. Can't rule out IBD, but this is less likely. May also have benign anorectal bleeding from hemorrhoids or mucosal trauma in the setting of diarrhea that is exaggerated in the setting of Eliquis. Differentials for diarrhea include infectious, celiac disease, thyroid abnormalities (patient not taking levothyroxine), microscopic colitis, and IBD.   Plan:  CBC, BMP, GI pathogen panel, C. diff GDH and toxin A/B, TSH,  IgA, TTG IgA If stool studies are negative, will plan to update colonoscopy ASAP.  Advised if any worsening symptoms, profuse rectal bleeding, or feeling like she will pass out, she should proceed to the emergency room.  I will reach out to Dr. Domenic Polite to get the ok to hold Eliquis in the setting of rectal bleeding.

## 2020-03-31 NOTE — Assessment & Plan Note (Signed)
Addressed under rectal bleeding 

## 2020-03-31 NOTE — Assessment & Plan Note (Addendum)
Odynophagia and dysphagia resolved.  EGD 02/16/2020 with normal examined esophagus s/p empiric dilation.  Suspect she may have an occult esophageal web.  GERD may have also been influencing dysphagia as symptoms were previously uncontrolled but are now much improved with Protonix 40 mg BID.   Advised she continue Protonix 40 mg BID and monitor for return of dysphagia. Previously prescribed Carafate in August at the onset of symptoms. Advised she discontinue Carafate.

## 2020-04-01 LAB — C DIFFICILE QUICK SCREEN W PCR REFLEX
C Diff antigen: NEGATIVE
C Diff interpretation: NOT DETECTED
C Diff toxin: NEGATIVE

## 2020-04-01 NOTE — Progress Notes (Signed)
CC'ED TO PCP 

## 2020-04-02 ENCOUNTER — Other Ambulatory Visit: Payer: Self-pay | Admitting: Gastroenterology

## 2020-04-02 DIAGNOSIS — R197 Diarrhea, unspecified: Secondary | ICD-10-CM

## 2020-04-02 LAB — GASTROINTESTINAL PANEL BY PCR, STOOL (REPLACES STOOL CULTURE)

## 2020-04-02 MED ORDER — COLESTIPOL HCL 1 G PO TABS: 2.0000 g | ORAL_TABLET | Freq: Every day | ORAL | 0 refills | Status: DC

## 2020-04-06 ENCOUNTER — Other Ambulatory Visit: Payer: Self-pay

## 2020-04-06 MED ORDER — POTASSIUM CHLORIDE CRYS ER 10 MEQ PO TBCR: 10.0000 meq | EXTENDED_RELEASE_TABLET | Freq: Every day | ORAL | 1 refills | Status: DC

## 2020-04-06 NOTE — Telephone Encounter (Signed)
Refilled patients Potassium CL ER 10 MEQ Tablets

## 2020-04-07 ENCOUNTER — Telehealth: Payer: Self-pay

## 2020-04-07 DIAGNOSIS — M898X6 Other specified disorders of bone, lower leg: Secondary | ICD-10-CM | POA: Diagnosis not present

## 2020-04-07 DIAGNOSIS — M25561 Pain in right knee: Secondary | ICD-10-CM | POA: Diagnosis not present

## 2020-04-07 DIAGNOSIS — S069X9A Unspecified intracranial injury with loss of consciousness of unspecified duration, initial encounter: Secondary | ICD-10-CM | POA: Diagnosis not present

## 2020-04-07 DIAGNOSIS — I4891 Unspecified atrial fibrillation: Secondary | ICD-10-CM | POA: Diagnosis not present

## 2020-04-07 DIAGNOSIS — W19XXXA Unspecified fall, initial encounter: Secondary | ICD-10-CM | POA: Diagnosis not present

## 2020-04-07 DIAGNOSIS — D649 Anemia, unspecified: Secondary | ICD-10-CM | POA: Diagnosis not present

## 2020-04-07 DIAGNOSIS — K921 Melena: Secondary | ICD-10-CM | POA: Diagnosis not present

## 2020-04-07 DIAGNOSIS — M25572 Pain in left ankle and joints of left foot: Secondary | ICD-10-CM | POA: Diagnosis not present

## 2020-04-07 DIAGNOSIS — Z6829 Body mass index (BMI) 29.0-29.9, adult: Secondary | ICD-10-CM | POA: Diagnosis not present

## 2020-04-07 NOTE — Telephone Encounter (Signed)
Can hold Eliquis 48 hours prior to colonoscopy. Thank You

## 2020-04-07 NOTE — Telephone Encounter (Signed)
GI Dr called about [name]. They have requested permission to hold her Eliquis for 48 hours in order to perform a Colonoscopy.

## 2020-04-12 ENCOUNTER — Telehealth: Payer: Self-pay

## 2020-04-12 ENCOUNTER — Telehealth: Payer: Self-pay | Admitting: Internal Medicine

## 2020-04-12 ENCOUNTER — Encounter: Payer: Self-pay | Admitting: *Deleted

## 2020-04-12 NOTE — Telephone Encounter (Signed)
noted 

## 2020-04-12 NOTE — Telephone Encounter (Signed)
Patient pcp said it was ok to hold her blood thinner so she could have her procedure.  They got the ok from, her cardiologist     [facility] [phone]

## 2020-04-12 NOTE — Telephone Encounter (Signed)
Dr. Lovena Le states ok to hold Eliquis 48 hrs prior to colonoscopy

## 2020-04-13 NOTE — Patient Instructions (Signed)
[name]  04/13/2020     @PREFPERIOPPHARMACY @   Your procedure is scheduled on  04/15/2020.  Report to [facility] at  1200   P.M.  Call this number if you have problems the morning of surgery:  [phone]   Remember:  Follow the diet and prep instructions given to you by the office.                  Take these medicines the morning of surgery with A SIP OF WATER  Xanax(if needed), diltiazem, imdur, metoprolol, protonix, tramadol(if needed).    Do not wear jewelry, make-up or nail polish.  Do not wear lotions, powders, or perfumes. Please wear deodorant and brush your teeth.  Do not shave 48 hours prior to surgery.  Men may shave face and neck.  Do not bring valuables to the hospital.  [facility] is not responsible for any belongings or valuables.  Contacts, dentures or bridgework may not be worn into surgery.  Leave your suitcase in the car.  After surgery it may be brought to your room.  For patients admitted to the hospital, discharge time will be determined by your treatment team.  Patients discharged the day of surgery will not be allowed to drive home.   Name and phone number of your driver:   family Special instructions:  DO NOT smoke the morning of your procedure.  Please read over the following fact sheets that you were given. Anesthesia Post-op Instructions and Care and Recovery After Surgery       Colonoscopy, Adult, Care After This sheet gives you information about how to care for yourself after your procedure. Your health care provider may also give you more specific instructions. If you have problems or questions, contact your health care provider. What can I expect after the procedure? After the procedure, it is common to have:  A small amount of blood in your stool for 24 hours after the procedure.  Some gas.  Mild cramping or bloating of your abdomen. Follow these instructions at home: Eating and drinking   Drink enough fluid to  keep your urine pale yellow.  Follow instructions from your health care provider about eating or drinking restrictions.  Resume your normal diet as instructed by your health care provider. Avoid heavy or fried foods that are hard to digest. Activity  Rest as told by your health care provider.  Avoid sitting for a long time without moving. Get up to take short walks every 1-2 hours. This is important to improve blood flow and breathing. Ask for help if you feel weak or unsteady.  Return to your normal activities as told by your health care provider. Ask your health care provider what activities are safe for you. Managing cramping and bloating   Try walking around when you have cramps or feel bloated.  Apply heat to your abdomen as told by your health care provider. Use the heat source that your health care provider recommends, such as a moist heat pack or a heating pad. ? Place a towel between your skin and the heat source. ? Leave the heat on for 20-30 minutes. ? Remove the heat if your skin turns bright red. This is especially important if you are unable to feel pain, heat, or cold. You may have a greater risk of getting burned. General instructions  For the first 24 hours after the procedure: ? Do not drive or use machinery. ? Do  not sign important documents. ? Do not drink alcohol. ? Do your regular daily activities at a slower pace than normal. ? Eat soft foods that are easy to digest.  Take over-the-counter and prescription medicines only as told by your health care provider.  Keep all follow-up visits as told by your health care provider. This is important. Contact a health care provider if:  You have blood in your stool 2-3 days after the procedure. Get help right away if you have:  More than a small spotting of blood in your stool.  Large blood clots in your stool.  Swelling of your abdomen.  Nausea or vomiting.  A fever.  Increasing pain in your abdomen that  is not relieved with medicine. Summary  After the procedure, it is common to have a small amount of blood in your stool. You may also have mild cramping and bloating of your abdomen.  For the first 24 hours after the procedure, do not drive or use machinery, sign important documents, or drink alcohol.  Get help right away if you have a lot of blood in your stool, nausea or vomiting, a fever, or increased pain in your abdomen. This information is not intended to replace advice given to you by your health care provider. Make sure you discuss any questions you have with your health care provider. Document Revised: 10/28/2018 Document Reviewed: 10/28/2018 Elsevier Patient Education  [place] These instructions provide you with information about caring for yourself after your procedure. Your health care provider may also give you more specific instructions. Your treatment has been planned according to current medical practices, but problems sometimes occur. Call your health care provider if you have any problems or questions after your procedure. What can I expect after the procedure? After your procedure, you may:  Feel sleepy for several hours.  Feel clumsy and have poor balance for several hours.  Feel forgetful about what happened after the procedure.  Have poor judgment for several hours.  Feel nauseous or vomit.  Have a sore throat if you had a breathing tube during the procedure. Follow these instructions at home: For at least 24 hours after the procedure:      Have a responsible adult stay with you. It is important to have someone help care for you until you are awake and alert.  Rest as needed.  Do not: ? Participate in activities in which you could fall or become injured. ? Drive. ? Use heavy machinery. ? Drink alcohol. ? Take sleeping pills or medicines that cause drowsiness. ? Make important decisions or sign legal  documents. ? Take care of children on your own. Eating and drinking  Follow the diet that is recommended by your health care provider.  If you vomit, drink water, juice, or soup when you can drink without vomiting.  Make sure you have little or no nausea before eating solid foods. General instructions  Take over-the-counter and prescription medicines only as told by your health care provider.  If you have sleep apnea, surgery and certain medicines can increase your risk for breathing problems. Follow instructions from your health care provider about wearing your sleep device: ? Anytime you are sleeping, including during daytime naps. ? While taking prescription pain medicines, sleeping medicines, or medicines that make you drowsy.  If you smoke, do not smoke without supervision.  Keep all follow-up visits as told by your health care provider. This is important. Contact a health care  provider if:  You keep feeling nauseous or you keep vomiting.  You feel light-headed.  You develop a rash.  You have a fever. Get help right away if:  You have trouble breathing. Summary  For several hours after your procedure, you may feel sleepy and have poor judgment.  Have a responsible adult stay with you for at least 24 hours or until you are awake and alert. This information is not intended to replace advice given to you by your health care provider. Make sure you discuss any questions you have with your health care provider. Document Revised: 07/02/2017 Document Reviewed: 07/25/2015 Elsevier Patient Education  [place].

## 2020-04-14 ENCOUNTER — Other Ambulatory Visit: Payer: Self-pay

## 2020-04-14 ENCOUNTER — Encounter (HOSPITAL_COMMUNITY)
Admission: RE | Admit: 2020-04-14 | Discharge: 2020-04-14 | Disposition: A | Discharge status: Home / Self Care | Payer: Medicare HMO | Source: Ambulatory Visit | Attending: Internal Medicine | Admitting: Internal Medicine

## 2020-04-14 ENCOUNTER — Telehealth: Payer: Self-pay | Admitting: *Deleted

## 2020-04-14 ENCOUNTER — Encounter (HOSPITAL_COMMUNITY): Payer: Self-pay

## 2020-04-14 ENCOUNTER — Other Ambulatory Visit (HOSPITAL_COMMUNITY)
Admission: RE | Admit: 2020-04-14 | Discharge: 2020-04-14 | Disposition: A | Discharge status: Home / Self Care | Payer: Medicare HMO | Source: Ambulatory Visit | Attending: Internal Medicine | Admitting: Internal Medicine

## 2020-04-14 NOTE — Telephone Encounter (Signed)
Received call from endo. Patient has already ate today. Patient is being sent home and will have her call to reschedule procedure once she gets back home.

## 2020-04-15 NOTE — Telephone Encounter (Signed)
Called pt. She was not available. Spoke with daughter. Patient has been rescheduled to 1/27 at 2:00pm. Aware will mail instructions with her pre-op/covid test appt. Offered for instructions to be picked up but daughter stated it was best to mail everything. Called endo and made aware of appt.

## 2020-04-16 DIAGNOSIS — I5033 Acute on chronic diastolic (congestive) heart failure: Secondary | ICD-10-CM | POA: Diagnosis not present

## 2020-04-16 DIAGNOSIS — J440 Chronic obstructive pulmonary disease with acute lower respiratory infection: Secondary | ICD-10-CM | POA: Diagnosis not present

## 2020-04-16 DIAGNOSIS — N1832 Chronic kidney disease, stage 3b: Secondary | ICD-10-CM | POA: Diagnosis not present

## 2020-04-16 DIAGNOSIS — I130 Hypertensive heart and chronic kidney disease with heart failure and stage 1 through stage 4 chronic kidney disease, or unspecified chronic kidney disease: Secondary | ICD-10-CM | POA: Diagnosis not present

## 2020-04-17 ENCOUNTER — Telehealth: Payer: Self-pay | Admitting: Gastroenterology

## 2020-04-17 NOTE — Telephone Encounter (Signed)
[name], as patient's colonoscopy date was changed, can you call to verify that her rectal bleeding has remained resolved? When I spoke with her on 12/17, she reported resolution at that time.

## 2020-04-19 NOTE — Telephone Encounter (Signed)
Spoke with a family member. Pt will call our office back when she wakes up.

## 2020-04-20 ENCOUNTER — Telehealth: Payer: Self-pay | Admitting: Internal Medicine

## 2020-04-20 NOTE — Telephone Encounter (Signed)
See other phone note for documentation. Call has been addressed.

## 2020-04-20 NOTE — Telephone Encounter (Signed)
Ok, great. We will proceed with colonoscopy as planned. No additional recommendations at this time.

## 2020-04-20 NOTE — Telephone Encounter (Signed)
Spoke with pt. Pt isn't seeing any blood in her stool at this time. Pt only has some bloating and is taking Imodium for her loose stool when it occurs.

## 2020-04-20 NOTE — Telephone Encounter (Signed)
Patient returned call from yesterday  [phone]

## 2020-04-20 NOTE — Telephone Encounter (Signed)
Noted  

## 2020-04-21 DIAGNOSIS — N189 Chronic kidney disease, unspecified: Secondary | ICD-10-CM | POA: Diagnosis not present

## 2020-04-21 DIAGNOSIS — E785 Hyperlipidemia, unspecified: Secondary | ICD-10-CM | POA: Diagnosis not present

## 2020-04-21 DIAGNOSIS — I130 Hypertensive heart and chronic kidney disease with heart failure and stage 1 through stage 4 chronic kidney disease, or unspecified chronic kidney disease: Secondary | ICD-10-CM | POA: Diagnosis not present

## 2020-04-21 DIAGNOSIS — F419 Anxiety disorder, unspecified: Secondary | ICD-10-CM | POA: Diagnosis not present

## 2020-04-21 DIAGNOSIS — I4891 Unspecified atrial fibrillation: Secondary | ICD-10-CM | POA: Diagnosis not present

## 2020-04-21 DIAGNOSIS — M109 Gout, unspecified: Secondary | ICD-10-CM | POA: Diagnosis not present

## 2020-04-21 DIAGNOSIS — I509 Heart failure, unspecified: Secondary | ICD-10-CM | POA: Diagnosis not present

## 2020-04-21 DIAGNOSIS — Z880 Allergy status to penicillin: Secondary | ICD-10-CM | POA: Diagnosis not present

## 2020-04-21 DIAGNOSIS — J449 Chronic obstructive pulmonary disease, unspecified: Secondary | ICD-10-CM | POA: Diagnosis not present

## 2020-04-21 DIAGNOSIS — L89322 Pressure ulcer of left buttock, stage 2: Secondary | ICD-10-CM | POA: Diagnosis not present

## 2020-04-21 DIAGNOSIS — K219 Gastro-esophageal reflux disease without esophagitis: Secondary | ICD-10-CM | POA: Diagnosis not present

## 2020-04-21 DIAGNOSIS — Z888 Allergy status to other drugs, medicaments and biological substances status: Secondary | ICD-10-CM | POA: Diagnosis not present

## 2020-04-21 DIAGNOSIS — K589 Irritable bowel syndrome without diarrhea: Secondary | ICD-10-CM | POA: Diagnosis not present

## 2020-04-21 DIAGNOSIS — Z885 Allergy status to narcotic agent status: Secondary | ICD-10-CM | POA: Diagnosis not present

## 2020-04-21 DIAGNOSIS — Z881 Allergy status to other antibiotic agents status: Secondary | ICD-10-CM | POA: Diagnosis not present

## 2020-04-21 DIAGNOSIS — Z79899 Other long term (current) drug therapy: Secondary | ICD-10-CM | POA: Diagnosis not present

## 2020-04-26 NOTE — Progress Notes (Signed)
Cardiology Office Note  Date: 04/27/2020   ID: [name], DOB Dec 14, 1932, MRN [id number]  PCP:  Royann Shivers, PA-C  Cardiologist:  Dina Rich, MD Electrophysiologist:  Lewayne Bunting, MD   Chief Complaint: Follow-up of atrial fibrillation, CAD, chronic diastolic heart failure, shortness of breath.  History of Present Illness: [name] is a 87 y.o. female with a history of atrial fibrillation, CAD, chronic diastolic heart failure, shortness of breath, CKD, COPD, dyspnea, HTN, GERD, HLD, anemia, PVD, thyroid disease, GERD.  Last seen by Dr. Wyline Mood 02/23/2020.  No recent chest pains.  Nuclear stress test July 2021 with no ischemia.  No recent palpitations.  Continuing Toprol and followed by Dr. Ladona Ridgel EP for NSVT.  No edema and weights were stable given history of chronic diastolic heart failure.  Status post TEE/DCCV November 10 2019 x3 shocks resulting in normal sinus rhythm.  Breathing was better since normal sinus rhythm restored.  Recent admission to [facility] for significant anemia with hemoglobin 6.7.  Some ongoing bleeding intermittently along with dysphagia followed by GI.  Shortness of breath mainly with activity using nebulizer twice a day.  She was continuing current therapy for diastolic heart failure.  Hemoglobin was normal 2 weeks prior to visit.  Bradycardia on EKG on that visit.  Diltiazem was lowered to 120 mg daily due to possible contribution to exertional symptoms.   She is here for 16-month follow-up.  States she has been having some significant fatigue and no energy.  States she has been having some bleeding internal and external hemorrhoids she has an upcoming colonoscopy with Dr. Jena Gauss she states.  Recent H&H on 03/31/2020 showed hemoglobin of 10.9 and hematocrit of 35.  Also complaining of increasing dyspnea on exertion.  States the DOE has been increasing over the last couple of months.  States sometimes when she walks her chest becomes tight.  She  denies any classic anginal symptoms i.e. chest pain, neck pain, jaw pain, back pain.  Denies any radiation.  Denies any associated nausea, vomiting, or diaphoresis.  Cardiac catheterization in 2020Demonstrated patent short left main, 20 to 30% proximal LAD irregularity with diffuse 20% narrowing in the mid vessel.  Patent proximal circumflex stent with mid to distal circumflex irregularities up to 30 to 40%.  Nondominant RCA with ostial 90% narrowing.  Compared to previous cardiac catheterization 2017 there are no significant changes.  Chest pain was described as atypical.  Also states she has been falling due to weakness in her legs.  She has had back surgeries in the past.  States she has significant pain in her back.  She states she has been told in the past they could not do any further surgeries on her due to her heart disease.  She denies any orthostatic symptoms i.e. lightheadedness, dizziness, presyncope or syncopal episodes., CVA or TIA-like symptoms, PND, orthopnea.  Denies any claudication-like symptoms, DVT or PE like symptoms or lower extremity edema.   Past Medical History:  Diagnosis Date  . Anxiety   . Arthritis   . Atrial fibrillation (HCC)   . Bursitis    Left shoulder  . Cataract   . CHF (congestive heart failure) (HCC)   . CKD (chronic kidney disease)    stage 3-4  . COPD (chronic obstructive pulmonary disease) (HCC)   . Coronary atherosclerosis of native coronary artery    a. s/p DES to LCx in 04/2013 b. cath in 11/2015 showing patent stent with 20% prox-LAD and 80% ostial  RCA stenosis for which medical management was recommended due to small artery size  . Depression   . Diastolic heart failure (HCC)    EF 55-60%  . Dysphagia, unspecified(787.20)   . Dyspnea   . Dysrhythmia   . Essential hypertension   . GERD (gastroesophageal reflux disease)    Hx Schatzki's ring, multiple EGD/ED last 01/06/2004  . Gout   . Headache   . History of anemia   . Hyperlipidemia   .  Internal hemorrhoids without mention of complication   . MI (myocardial infarction) (HCC) 2006  . Microscopic colitis 2003  . Panic disorder without agoraphobia   . Paresthesia   . Pneumonia 12/2011  . PVD (peripheral vascular disease) (HCC)   . S/P colonoscopy 09/27/2001   internal hemorrhoids, desc colon inflam polyp, SB BX-chronic duodenitis, colitis  . Sleep apnea   . Thyroid disease     Past Surgical History:  Procedure Laterality Date  . ABDOMINAL HYSTERECTOMY    . ABDOMINAL HYSTERECTOMY    . ANTERIOR AND POSTERIOR REPAIR     with resection of vagina  . ANTERIOR LAT LUMBAR FUSION N/A 08/01/2016   Procedure: Lumbar Two-Lumbar Five Transpsoas lateral interbody fusion with Lumbar Two-Three lateral plate fixation;  Surgeon: Loura Halt Ditty, MD;  Location: [facility] OR;  Service: Neurosurgery;  Laterality: N/A;  L2-5 Transpsoas lateral interbody fusion with L2-3 lateral plate fixation  . APPENDECTOMY    . BACK SURGERY    . BIOPSY  07/05/2015   Procedure: BIOPSY;  Surgeon: Corbin Ade, MD;  Location: [facility] ENDO SUITE;  Service: Endoscopy;;  gastric polyp biopsy, ascending colon biopsy  . BIOPSY  02/16/2020   Procedure: BIOPSY;  Surgeon: Corbin Ade, MD;  Location: [facility];  Service: Endoscopy;;  . BLADDER SUSPENSION  11/09/2011   Procedure: TRANSVAGINAL TAPE (TVT) PROCEDURE;  Surgeon: Ky Barban, MD;  Location: [facility] ORS;  Service: Urology;  Laterality: N/A;  . bladder tack  06/2010  . BREAST LUMPECTOMY  1998   left, benign  . CARDIAC CATHETERIZATION    . CARDIAC CATHETERIZATION    . CARDIAC CATHETERIZATION N/A 12/16/2015   Procedure: Left Heart Cath and Coronary Angiography;  Surgeon: Lennette Bihari, MD;  Location: [facility] INVASIVE CV LAB;  Service: Cardiovascular;  Laterality: N/A;  . CARDIOVERSION N/A 10/04/2017   Procedure: CARDIOVERSION;  Surgeon: Laqueta Linden, MD;  Location: [facility] ORS;  Service: Cardiovascular;  Laterality: N/A;  . CARDIOVERSION N/A 01/30/2018    Procedure: CARDIOVERSION;  Surgeon: Laqueta Linden, MD;  Location: [facility];  Service: Cardiovascular;  Laterality: N/A;  . CARDIOVERSION N/A 11/10/2019   Procedure: CARDIOVERSION;  Surgeon: Wendall Stade, MD;  Location: [facility] ENDOSCOPY;  Service: Cardiovascular;  Laterality: N/A;  . CARPAL TUNNEL RELEASE  1989   left  . cataract surgery    . CHOLECYSTECTOMY  1998  . Cholecystectomy    . COLONOSCOPY  03/16/2011   multiple hyperplastic colon polyps, sigmoid diverticulosis, melanosis coli  . COLONOSCOPY WITH PROPOFOL N/A 07/05/2015   RMR:one 5 mm polyp in descending colon  . CORONARY ANGIOGRAPHY N/A 05/16/2018   Procedure: CORONARY ANGIOGRAPHY (CATH LAB);  Surgeon: Lyn Records, MD;  Location: [facility] INVASIVE CV LAB;  Service: Cardiovascular;  Laterality: N/A;  . CORONARY ANGIOPLASTY WITH STENT PLACEMENT  2015  . ESOPHAGEAL DILATION N/A 07/05/2015   Procedure: ESOPHAGEAL DILATION;  Surgeon: Corbin Ade, MD;  Location: [facility];  Service: Endoscopy;  Laterality: N/A;  . ESOPHAGOGASTRODUODENOSCOPY (EGD) WITH  PROPOFOL N/A 07/05/2015   [id number]  . ESOPHAGOGASTRODUODENOSCOPY (EGD) WITH PROPOFOL N/A 02/16/2020   Procedure: ESOPHAGOGASTRODUODENOSCOPY (EGD) WITH PROPOFOL;  Surgeon: Corbin Ade, MD;  Normal examined esophagus s/p dilation, erythematous mucosa in the stomach s/p biopsy, normal examined duodenum. Pathology with mild gastropathy, negative for H. Pylori.   [name] Kitchen JOINT REPLACEMENT Right 2007   right knee  . left hand surgery    . LEFT HEART CATHETERIZATION WITH CORONARY ANGIOGRAM N/A 05/14/2013   Procedure: LEFT HEART CATHETERIZATION WITH CORONARY ANGIOGRAM;  Surgeon: Micheline Chapman, MD;  Location: [facility] CATH LAB;  Service: Cardiovascular;  Laterality: N/A;  . left rotator cuff surgery    . LUMBAR LAMINECTOMY/DECOMPRESSION MICRODISCECTOMY N/A 10/11/2012   Procedure: LUMBAR LAMINECTOMY/DECOMPRESSION MICRODISCECTOMY 2 LEVELS;  Surgeon: Karn Cassis, MD;  Location: [facility] NEURO  ORS;  Service: Neurosurgery;  Laterality: N/A;  L3-4 L4-5 Laminectomy  . LUMBAR WOUND DEBRIDEMENT N/A 09/27/2015   Procedure: Exploration of Lumbar Wound w/ Repair CSF Leak/Lumbar Drain Placement;  Surgeon: Hilda Lias, MD;  Location: [facility] NEURO ORS;  Service: Neurosurgery;  Laterality: N/A;  . MALONEY DILATION  03/16/2011   Gastritis. No H.pylori on bx. 33F maloney dilation with disruption of  occult cevical esophageal web  . MALONEY DILATION N/A 02/16/2020   Procedure: Elease Hashimoto DILATION;  Surgeon: Corbin Ade, MD;  Location: [facility];  Service: Endoscopy;  Laterality: N/A;  . NASAL SINUS SURGERY    . right knee replacement  2007  . right leg benign tumor    . SHOULDER SURGERY Left   . TEE WITHOUT CARDIOVERSION N/A 10/04/2017   Procedure: TRANSESOPHAGEAL ECHOCARDIOGRAM (TEE) WITH PROPOFOL;  Surgeon: Laqueta Linden, MD;  Location: [facility] ORS;  Service: Cardiovascular;  Laterality: N/A;  . TEE WITHOUT CARDIOVERSION N/A 11/10/2019   Procedure: TRANSESOPHAGEAL ECHOCARDIOGRAM (TEE);  Surgeon: Wendall Stade, MD;  Location: [facility] ENDOSCOPY;  Service: Cardiovascular;  Laterality: N/A;  . TONSILLECTOMY    . unspecified area, hysterectomy  1972   partial    Current Outpatient Medications  Medication Sig Dispense Refill  . acetaminophen (TYLENOL) 500 MG tablet Take 500 mg by mouth every 6 (six) hours as needed for headache.     . ALPRAZolam (XANAX) 0.5 MG tablet Take 0.5 mg by mouth as needed.     [name] Kitchen apixaban (ELIQUIS) 5 MG TABS tablet Take 1 tablet (5 mg total) by mouth 2 (two) times daily. 180 tablet 3  . colestipol (COLESTID) 1 g tablet Take 2 tablets (2 g total) by mouth daily. Administer other medication 1 hour before or 4 hours after colestipol. 60 tablet 0  . diltiazem (CARDIZEM CD) 120 MG 24 hr capsule Take 1 capsule (120 mg total) by mouth daily. 90 capsule 1  . DULoxetine (CYMBALTA) 60 MG capsule Take 60 mg by mouth at bedtime.    . fluticasone (CUTIVATE) 0.05 % cream Apply 1  application topically 2 (two) times daily.     . fluticasone (FLONASE) 50 MCG/ACT nasal spray Place 2 sprays into both nostrils as needed for allergies.    . furosemide (LASIX) 40 MG tablet Take 1 tablet (40 mg total) by mouth daily. (Patient taking differently: Take 40 mg by mouth daily. Takes 20 mg daily.) 30 tablet 1  . hydrocortisone (ANUSOL-HC) 2.5 % rectal cream Place 1 application rectally 2 (two) times daily. 30 g 1  . ipratropium-albuterol (DUONEB) 0.5-2.5 (3) MG/3ML SOLN Take 3 mLs by nebulization every 6 (six) hours. 360 mL 0  . isosorbide mononitrate (IMDUR) 60 MG  24 hr tablet Take 1 tablet (60 mg total) by mouth daily. 90 tablet 3  . levothyroxine (SYNTHROID, LEVOTHROID) 25 MCG tablet Take 1 tablet by mouth daily.    . magnesium oxide (MAG-OX) 400 MG tablet TAKE ONE TABLET BY MOUTH DAILY. 90 tablet 3  . methocarbamol (ROBAXIN) 500 MG tablet Take 500 mg by mouth every 6 (six) hours as needed for muscle spasms.    . metoprolol succinate (TOPROL-XL) 25 MG 24 hr tablet Take 25 mg by mouth daily. Takes with the 50mg  tablet to equal 75mg  total for the day.    . metoprolol succinate (TOPROL-XL) 50 MG 24 hr tablet Take 50 mg by mouth daily. Takes with the 25mg  tablet to equal 75mg  total for the day.    . Multiple Vitamin (MULTIVITAMIN WITH MINERALS) TABS tablet Take 1 tablet by mouth daily. Centrum    . nitroGLYCERIN (NITROSTAT) 0.4 MG SL tablet Place 1 tablet (0.4 mg total) under the tongue every 5 (five) minutes as needed for chest pain. Reported on 08/04/2015 25 tablet 6  . pantoprazole (PROTONIX) 40 MG tablet Take 1 tablet (40 mg total) by mouth 2 (two) times daily before a meal. 60 tablet 3  . potassium chloride (KLOR-CON) 10 MEQ tablet Take 1 tablet (10 mEq total) by mouth daily. 180 tablet 1  . sodium chloride (OCEAN) 0.65 % SOLN nasal spray Place 1 spray into both nostrils as needed (allergies).     . traMADol (ULTRAM) 50 MG tablet Take 50 mg by mouth every 6 (six) hours as needed for  pain.     No current facility-administered medications for this visit.   Allergies:  Cephalosporins, Levaquin [levofloxacin in d5w], Macrodantin [nitrofurantoin macrocrystal], Phenothiazines, Polysorbate, Prednisone, Buspirone, Cardura [doxazosin mesylate], Codeine, Acyclovir and related, Colcrys [colchicine], Prochlorperazine, Ranexa [ranolazine], Atorvastatin, Ofloxacin, Other, Penicillins, and Pimozide   Social History: The patient  reports that she quit smoking about 18 years ago. Her smoking use included cigarettes. She started smoking about 72 years ago. She has a 64.00 pack-year smoking history. She has never used smokeless tobacco. She reports current alcohol use. She reports that she does not use drugs.   Family History: The patient's family history includes Cancer in an other family member; Colon cancer in her grandson; Coronary artery disease in an other family member; Diabetes in her brother and son; Heart disease in her son; Hypertension in an other family member; Parkinson's disease in her father; Stroke in her mother and another family member; Stroke (age of onset: 71) in her daughter.   ROS:  Please see the history of present illness. Otherwise, complete review of systems is positive for none.  All other systems are reviewed and negative.   Physical Exam: VS:  BP 120/64   Pulse 74   Ht 5\' 1"  (1.549 m)   Wt 153 lb 9.6 oz (69.7 kg)   SpO2 98%   BMI 29.02 kg/m , BMI Body mass index is 29.02 kg/m.  Wt Readings from Last 3 Encounters:  04/27/20 153 lb 9.6 oz (69.7 kg)  04/14/20 152 lb (68.9 kg)  03/31/20 156 lb 3.2 oz (70.9 kg)    General: Patient appears comfortable at rest. Neck: Supple, no elevated JVP or carotid bruits, no thyromegaly. Lungs: Clear to auscultation, nonlabored breathing at rest. Cardiac: Regular rate and rhythm, no S3 or significant systolic murmur, no pericardial rub. Abdomen: Soft, nontender, no hepatomegaly, bowel sounds present, no guarding or  rebound. Extremities: No pitting edema, distal pulses 2+. Skin: Warm and  dry. Musculoskeletal: No kyphosis. Neuropsychiatric: Alert and oriented x3, affect grossly appropriate.  ECG:    Recent Labwork: 09/19/2019: ALT 13; AST 20 09/22/2019: Magnesium 1.8 11/09/2019: B Natriuretic Peptide 365.4 03/31/2020: BUN 20; Creatinine, Ser 1.31; Hemoglobin 10.9; Platelets 354; Potassium 4.5; Sodium 134; TSH 1.903     Component Value Date/Time   CHOL 152 11/10/2019 0502   TRIG 82 11/10/2019 0502   HDL 38 (L) 11/10/2019 0502   CHOLHDL 4.0 11/10/2019 0502   VLDL 16 11/10/2019 0502   LDLCALC 98 11/10/2019 0502    Other Studies Reviewed Today:   Diagnostic Studies Cardiac Catheterization: 05-23-2018  Patent short left main  20 to 30% proximal LAD irregularity, with diffuse 20% narrowing within the mid vessel.  Patent proximal circumflex stent with mid to distal circumflex irregularities up to 30 to 40%.  Nondominant right coronary with ostial 90% narrowing.  No hemodynamics recorded. Left ventriculography was not performed in an effort to decrease contrast exposure in the setting of CKD.  RECOMMENDATIONS:   Compared to prior angiography in 2017, no significant changes noted.  Chest pain is atypical and may be more musculoskeletal or pleuritic.  Resume Eliquis in 12 hours/a.m. of 05/17/2018  Echocardiogram 10/02/18:  1. The left ventricle has normal systolic function with an ejection  fraction of 60-65%. The cavity size was normal. There is mildly increased  left ventricular wall thickness. Left ventricular diastolic Doppler  parameters are consistent with impaired  relaxation. Elevated mean left atrial pressure.  2. The right ventricle has normal systolic function. The cavity was  normal. There is no increase in right ventricular wall thickness.  3. Left atrial size was mildly dilated.  4. The aortic valve is tricuspid. Mild thickening of the aortic valve.  Mild  calcification of the aortic valve. Aortic valve regurgitation is mild  by color flow Doppler. No stenosis of the aortic valve. Mild aortic  annular calcification noted.  5. The mitral valve is abnormal. Mild thickening of the mitral valve  leaflet. Mild calcification of the mitral valve leaflet. There is mild  mitral annular calcification present. No evidence of mitral valve  stenosis.  6. The aortic root is normal in size and structure.  7. Pulmonary hypertension is indeterminant, inadequate TR jet.   Event monitor 05/13/19:  1. NSR  2. NSVT, lasting upto 6 beats 3. No prolonged pauses or bradycardia 4. No atrial fib   09/2019 echo 1. Left ventricular ejection fraction, by estimation, is 60 to 65%. The  left ventricle has normal function. The left ventricle has no regional  wall motion abnormalities. There is mild left ventricular hypertrophy.  Left ventricular diastolic parameters  are indeterminate.  2. Right ventricular systolic function is normal. The right ventricular  size is normal. There is normal pulmonary artery systolic pressure.  3. The mitral valve is normal in structure. Trivial mitral valve  regurgitation. No evidence of mitral stenosis.  4. The aortic valve was not well visualized. Aortic valve regurgitation  is mild. No aortic stenosis is present.    Assessment and Plan:  1. CAD in native artery   2. Chronic diastolic heart failure (HCC)   3. SOB (shortness of breath)   4. Atrial fibrillation, unspecified type (HCC)    1. CAD in native artery Complaining of increasing chest tightness when performing more than usual activity beyond her ADLs.  She states just walking from her car into the clinic caused chest tightness.  Please get a Lexiscan stress test.  Continue Imdur 60  mg daily.  Continue nitroglycerin sublingual as needed.  Continue Toprol 75 mg p.o. daily.  Not on aspirin due to systemic anticoagulation for atrial fibrillation  2. Chronic  diastolic heart failure (HCC) Reports increasing dyspnea on exertion.  Lungs are clear to auscultation.  Weights are staying stable around 153-156.  No lower extremity edema noted.  We are ordering a repeat echocardiogram as noted below #3.  Continue Lasix 40 mg daily.  Continue Toprol 75 mg daily.   3. SOB (shortness of breath) States she has been having increasing shortness of breath/dyspnea on exertion over the last few months which seems to be worsening.  Please get a repeat echocardiogram to assess LV function, diastolic function and valvular function.  4. Atrial fibrillation, unspecified type (HCC) Denies any significant episodes of tachycardia, palpitations or arrhythmias.  Continue metoprolol XL 75 mg daily.  Continue diltiazem 120 mg daily.  Continue Eliquis 5 mg p.o. twice daily.  5.  Fatigue/low energy/history of anemia Please get a follow-up CBC, be met and thyroid panel secondary to recent increasing fatigue and low energy with history of anemia.  Patient states she has history of bleeding hemorrhoids internal and external.  She has an upcoming EGD with Dr. Jena Gauss.  Last hemoglobin on 03/31/2020 was 10.9 with hematocrit of 35.8.  6.  Back pain/falls Patient states she is having recent falls x2.  Describes it as her legs giving way on her without warning.  She has chronic back issues and has previous history of surgeries on her back.  Her chronic back pain seems to be worsening.  Please refer her back to [facility] neurology for evaluation of back pain and falls.    Medication Adjustments/Labs and Tests Ordered: Current medicines are reviewed at length with the patient today.  Concerns regarding medicines are outlined above.   Disposition: Follow-up with Dr. Wyline Mood or APP 6 weeks  Signed, Rennis Harding, NP 04/27/2020 1:41 PM    [facility] [address] [place], [place], [place] [address] Phone: [phone]; Fax: [phone]

## 2020-04-27 ENCOUNTER — Encounter: Payer: Self-pay | Admitting: Family Medicine

## 2020-04-27 ENCOUNTER — Other Ambulatory Visit: Payer: Self-pay

## 2020-04-27 ENCOUNTER — Encounter: Payer: Self-pay | Admitting: *Deleted

## 2020-04-27 ENCOUNTER — Telehealth: Payer: Self-pay | Admitting: Family Medicine

## 2020-04-27 ENCOUNTER — Ambulatory Visit: Payer: PPO | Admitting: Family Medicine

## 2020-04-27 VITALS — BP 120/64 | HR 74 | Ht 61.0 in | Wt 153.6 lb

## 2020-04-27 DIAGNOSIS — R0602 Shortness of breath: Secondary | ICD-10-CM

## 2020-04-27 DIAGNOSIS — R0789 Other chest pain: Secondary | ICD-10-CM | POA: Diagnosis not present

## 2020-04-27 DIAGNOSIS — I4891 Unspecified atrial fibrillation: Secondary | ICD-10-CM | POA: Diagnosis not present

## 2020-04-27 DIAGNOSIS — D649 Anemia, unspecified: Secondary | ICD-10-CM | POA: Diagnosis not present

## 2020-04-27 DIAGNOSIS — I5032 Chronic diastolic (congestive) heart failure: Secondary | ICD-10-CM

## 2020-04-27 DIAGNOSIS — I2510 Atherosclerotic heart disease of native coronary artery without angina pectoris: Secondary | ICD-10-CM | POA: Diagnosis not present

## 2020-04-27 DIAGNOSIS — R5383 Other fatigue: Secondary | ICD-10-CM | POA: Diagnosis not present

## 2020-04-27 NOTE — Telephone Encounter (Signed)
Pre-cert Verification for the following procedure    LEXISCAN   DATE: 05/05/2020  [name]

## 2020-04-27 NOTE — Patient Instructions (Addendum)
Medication Instructions:  Continue all current medications.  Labwork: BMET, CBC, TSH, T4, T3 - orders given today.   Testing/Procedures:  Your physician has requested that you have an echocardiogram. Echocardiography is a painless test that uses sound waves to create images of your heart. It provides your doctor with information about the size and shape of your heart and how well your heart's chambers and valves are working. This procedure takes approximately one hour. There are no restrictions for this procedure.  Your physician has requested that you have a lexiscan myoview. For further information please visit [email or web address]. Please follow instruction sheet, as given.  Follow-Up:  Office will contact with results via phone or letter.    6 weeks   Any Other Special Instructions Will Be Listed Below (If Applicable). You have been referred to:  Neurology - please call office back with name of previous MD.    If you need a refill on your cardiac medications before your next appointment, please call your pharmacy.

## 2020-04-28 ENCOUNTER — Other Ambulatory Visit: Payer: PPO

## 2020-05-01 DIAGNOSIS — J449 Chronic obstructive pulmonary disease, unspecified: Secondary | ICD-10-CM | POA: Diagnosis not present

## 2020-05-05 ENCOUNTER — Encounter (HOSPITAL_COMMUNITY): Payer: PPO

## 2020-05-06 ENCOUNTER — Encounter (HOSPITAL_COMMUNITY): Payer: Self-pay

## 2020-05-06 ENCOUNTER — Other Ambulatory Visit: Payer: Self-pay

## 2020-05-06 ENCOUNTER — Encounter (HOSPITAL_COMMUNITY)
Admission: RE | Admit: 2020-05-06 | Discharge: 2020-05-06 | Disposition: A | Discharge status: Home / Self Care | Payer: PPO | Source: Ambulatory Visit | Attending: Internal Medicine | Admitting: Internal Medicine

## 2020-05-10 ENCOUNTER — Telehealth: Payer: Self-pay | Admitting: Internal Medicine

## 2020-05-10 NOTE — Telephone Encounter (Signed)
Called patient. She is having fever, diarrhea and a major gout flare. Barely can walk she wants to postpone procedure for now. Advised will call with march schedule once received

## 2020-05-10 NOTE — Telephone Encounter (Signed)
Pt has been exposed to covid, running a fever, diarrhea. She needs to reschedule her procedure with Dr Gala Romney for 05/13/2020

## 2020-05-11 ENCOUNTER — Other Ambulatory Visit (HOSPITAL_COMMUNITY): Admission: RE | Admit: 2020-05-11 | Payer: PPO | Source: Ambulatory Visit

## 2020-05-12 NOTE — Telephone Encounter (Signed)
Called pt. She has been rescheduled to 3/14, am appt. Aware hospital will inform of arrival time. Advised will mail new prep instructions with pre-op/covid test appt. Confirmed mailing address

## 2020-05-13 ENCOUNTER — Encounter: Payer: Self-pay | Admitting: *Deleted

## 2020-05-13 ENCOUNTER — Ambulatory Visit (HOSPITAL_COMMUNITY): Admission: RE | Admit: 2020-05-13 | Payer: PPO | Source: Home / Self Care | Admitting: Internal Medicine

## 2020-05-13 ENCOUNTER — Encounter (HOSPITAL_COMMUNITY): Admission: RE | Payer: Self-pay | Source: Home / Self Care

## 2020-05-13 SURGERY — COLONOSCOPY WITH PROPOFOL
Anesthesia: Monitor Anesthesia Care

## 2020-05-15 DIAGNOSIS — I130 Hypertensive heart and chronic kidney disease with heart failure and stage 1 through stage 4 chronic kidney disease, or unspecified chronic kidney disease: Secondary | ICD-10-CM | POA: Diagnosis not present

## 2020-05-15 DIAGNOSIS — I5033 Acute on chronic diastolic (congestive) heart failure: Secondary | ICD-10-CM | POA: Diagnosis not present

## 2020-05-15 DIAGNOSIS — J440 Chronic obstructive pulmonary disease with acute lower respiratory infection: Secondary | ICD-10-CM | POA: Diagnosis not present

## 2020-05-15 DIAGNOSIS — N1832 Chronic kidney disease, stage 3b: Secondary | ICD-10-CM | POA: Diagnosis not present

## 2020-05-15 DIAGNOSIS — Z87891 Personal history of nicotine dependence: Secondary | ICD-10-CM | POA: Diagnosis not present

## 2020-05-17 ENCOUNTER — Telehealth: Payer: Self-pay | Admitting: Family Medicine

## 2020-05-17 DIAGNOSIS — Z79899 Other long term (current) drug therapy: Secondary | ICD-10-CM

## 2020-05-17 NOTE — Telephone Encounter (Signed)
Spoke with patient and her son, Jeneen Rinks. Patients son verbalized understanding about medication dosages and will call back with weights. Also verbalized understanding of getting BMET in one week. Orders were put in .

## 2020-05-17 NOTE — Telephone Encounter (Signed)
New message     Pt c/o swelling: STAT is pt has developed SOB within 24 hours  1) How much weight have you gained and in what time span? Not sure that she can tell   2) If swelling, where is the swelling located?  Legs and feet are swollen and pink   3) Are you currently taking a fluid pill?  yes  4) Are you currently SOB? yes  5) Do you have a log of your daily weights (if so, list)? No   6) Have you gained 3 pounds in a day or 5 pounds in a week? Not sure  7) Have you traveled recently? No

## 2020-05-17 NOTE — Telephone Encounter (Signed)
Have her take Lasix 40 mg twice a day for the next 4 days.  Then revert back to the 40 mg daily only.  In 1 week get a basic metabolic panel after she increases the dosage.  Thank you.  Have her call back with weight and to let us know if her symptoms have improved after the increased dosage.

## 2020-05-17 NOTE — Telephone Encounter (Signed)
Patient is complaining of SOB and CP. Feet and ankles are swollen and it pains her to walk to the bathroom from her bed. Patient states she is taking 40 mg of furosemide. She is not sure how much weight she has gained over the course of overnight or the week. Please advise.

## 2020-05-18 ENCOUNTER — Ambulatory Visit: Payer: PPO | Admitting: Internal Medicine

## 2020-05-18 ENCOUNTER — Encounter: Payer: Self-pay | Admitting: Internal Medicine

## 2020-05-18 ENCOUNTER — Other Ambulatory Visit: Payer: Self-pay

## 2020-05-18 VITALS — BP 140/78 | HR 83 | Ht 61.0 in | Wt 151.0 lb

## 2020-05-18 DIAGNOSIS — R230 Cyanosis: Secondary | ICD-10-CM | POA: Diagnosis not present

## 2020-05-18 NOTE — Progress Notes (Signed)
HPI [name] returns today for followup. She is a pleasant 87 yo woman with PAF, NSVT, HTN, and chronic anti-coagulation. She has developed pain in her left foot along with some swelling over the past month. No trauma. She has been on an [facility] for years. She denies trauma to the leg. She denies palpitations. She admits to occaisional non-compliance.  Allergies  Allergen Reactions  . Cephalosporins Diarrhea and Nausea Only    Lightheaded  . Levaquin [Levofloxacin In D5w] Swelling  . Macrodantin [Nitrofurantoin Macrocrystal] Swelling  . Phenothiazines Anaphylaxis and Hives  . Polysorbate Anaphylaxis  . Prednisone Shortness Of Breath  . Buspirone Itching  . Cardura [Doxazosin Mesylate] Itching  . Codeine Itching  . Acyclovir And Related Itching    Redness of skin  . Colcrys [Colchicine] Nausea Only    Upset stomach   . Prochlorperazine Other (See Comments)    "Upset stomach"  . Ranexa [Ranolazine]     Severe drop in BP  . Atorvastatin Hives    Cramping; tolerates Crestor ok  . Colestipol Palpitations  . Ofloxacin Rash  . Other Itching and Rash    "WOOL"= make skin look like it has been burned  . Penicillins Other (See Comments)    Causes redness all over. Has patient had a PCN reaction causing immediate rash, facial/tongue/throat swelling, SOB or lightheadedness with hypotension: No Has patient had a PCN reaction causing severe rash involving mucus membranes or skin necrosis: No Has patient had a PCN reaction that required hospitalization No Has patient had a PCN reaction occurring within the last 10 years: No If all of the above answers are "NO", then may proceed with Cephalosporin use.   . Pimozide Hives and Itching     Current Outpatient Medications  Medication Sig Dispense Refill  . acetaminophen (TYLENOL) 500 MG tablet Take 500 mg by mouth every 6 (six) hours as needed for headache or moderate pain.    [name] Kitchen albuterol (PROVENTIL) (2.5 MG/3ML) 0.083% nebulizer  solution Take 2.5 mg by nebulization 2 (two) times daily.    [name] Kitchen ALPRAZolam (XANAX) 0.5 MG tablet Take 0.5 mg by mouth daily as needed for anxiety or sleep.    [name] Kitchen apixaban (ELIQUIS) 5 MG TABS tablet Take 1 tablet (5 mg total) by mouth 2 (two) times daily. 180 tablet 3  . colestipol (COLESTID) 1 g tablet Take 2 tablets (2 g total) by mouth daily. Administer other medication 1 hour before or 4 hours after colestipol. (Patient not taking: No sig reported) 60 tablet 0  . diltiazem (CARDIZEM CD) 120 MG 24 hr capsule Take 1 capsule (120 mg total) by mouth daily. 90 capsule 1  . DULoxetine (CYMBALTA) 60 MG capsule Take 60 mg by mouth at bedtime.    . fluticasone (CUTIVATE) 0.05 % cream Apply 1 application topically 2 (two) times daily as needed (skin tears).    . furosemide (LASIX) 40 MG tablet Take 1 tablet (40 mg total) by mouth daily. (Patient taking differently: Take 20 mg by mouth daily.) 30 tablet 1  . hydrocortisone (ANUSOL-HC) 2.5 % rectal cream Place 1 application rectally 2 (two) times daily. (Patient taking differently: Place 1 application rectally 2 (two) times daily as needed (irritation).) 30 g 1  . isosorbide mononitrate (IMDUR) 60 MG 24 hr tablet Take 1 tablet (60 mg total) by mouth daily. 90 tablet 3  . loperamide (IMODIUM A-D) 2 MG tablet Take 4 mg by mouth 2 (two) times daily as needed for diarrhea or  loose stools.    . magnesium oxide (MAG-OX) 400 MG tablet TAKE ONE TABLET BY MOUTH DAILY. (Patient taking differently: Take 400 mg by mouth daily.) 90 tablet 3  . metoprolol succinate (TOPROL-XL) 25 MG 24 hr tablet Take 75 mg by mouth daily.    . Multiple Vitamin (MULTIVITAMIN WITH MINERALS) TABS tablet Take 1 tablet by mouth daily. Centrum    . Naphazoline HCl (CLEAR EYES OP) Place 1 drop into both eyes daily as needed (itching).    . nitroGLYCERIN (NITROSTAT) 0.4 MG SL tablet Place 1 tablet (0.4 mg total) under the tongue every 5 (five) minutes as needed for chest pain. Reported on 08/04/2015  25 tablet 6  . pantoprazole (PROTONIX) 40 MG tablet Take 1 tablet (40 mg total) by mouth 2 (two) times daily before a meal. 60 tablet 3  . potassium chloride (KLOR-CON) 10 MEQ tablet Take 1 tablet (10 mEq total) by mouth daily. 180 tablet 1  . sodium chloride (OCEAN) 0.65 % SOLN nasal spray Place 1 spray into both nostrils daily as needed (allergies).     No current facility-administered medications for this visit.     Past Medical History:  Diagnosis Date  . Anxiety   . Arthritis   . Atrial fibrillation ([place])   . Bursitis    Left shoulder  . Cataract   . CHF (congestive heart failure) ([place])   . CKD (chronic kidney disease)    stage 3-4  . COPD (chronic obstructive pulmonary disease) ([place])   . Coronary atherosclerosis of native coronary artery    a. s/p DES to LCx in 04/2013 b. cath in 11/2015 showing patent stent with 20% prox-LAD and 80% ostial RCA stenosis for which medical management was recommended due to small artery size  . Depression   . Diastolic heart failure ([facility])    EF 55-60%  . Dysphagia, unspecified(787.20)   . Dyspnea   . Dysrhythmia   . Essential hypertension   . GERD (gastroesophageal reflux disease)    Hx Schatzki's ring, multiple EGD/ED last 01/06/2004  . Gout   . Headache   . History of anemia   . Hyperlipidemia   . Internal hemorrhoids without mention of complication   . MI (myocardial infarction) ([place]) 2006  . Microscopic colitis 2003  . Panic disorder without agoraphobia   . Paresthesia   . Pneumonia 12/2011  . PVD (peripheral vascular disease) ([place])   . S/P colonoscopy 09/27/2001   internal hemorrhoids, desc colon inflam polyp, SB BX-chronic duodenitis, colitis  . Sleep apnea   . Thyroid disease     ROS:   All systems reviewed and negative except as noted in the HPI.   Past Surgical History:  Procedure Laterality Date  . ABDOMINAL HYSTERECTOMY    . ABDOMINAL HYSTERECTOMY    . ANTERIOR AND POSTERIOR REPAIR     with resection of vagina  .  ANTERIOR LAT LUMBAR FUSION N/A 08/01/2016   Procedure: Lumbar Two-Lumbar Five Transpsoas lateral interbody fusion with Lumbar Two-Three lateral plate fixation;  Surgeon: Kevan Ny Ditty, MD;  Location: [place];  Service: Neurosurgery;  Laterality: N/A;  L2-5 Transpsoas lateral interbody fusion with L2-3 lateral plate fixation  . APPENDECTOMY    . BACK SURGERY    . BIOPSY  07/05/2015   Procedure: BIOPSY;  Surgeon: Daneil Dolin, MD;  [facility];  Service: Endoscopy;;  gastric polyp biopsy, ascending colon biopsy  . BIOPSY  02/16/2020   Procedure: BIOPSY;  Surgeon: Daneil Dolin, MD;  [facility];  Service: Endoscopy;;  . BLADDER SUSPENSION  11/09/2011   Procedure: TRANSVAGINAL TAPE (TVT) PROCEDURE;  Surgeon: Marissa Nestle, MD;  [facility];  Service: Urology;  Laterality: N/A;  . bladder tack  06/2010  . BREAST LUMPECTOMY  1998   left, benign  . CARDIAC CATHETERIZATION    . CARDIAC CATHETERIZATION    . CARDIAC CATHETERIZATION N/A 12/16/2015   Procedure: Left Heart Cath and Coronary Angiography;  Surgeon: Troy Sine, MD;  Location: [facility];  Service: Cardiovascular;  Laterality: N/A;  . CARDIOVERSION N/A 10/04/2017   Procedure: CARDIOVERSION;  Surgeon: Herminio Commons, MD;  [facility];  Service: Cardiovascular;  Laterality: N/A;  . CARDIOVERSION N/A 01/30/2018   Procedure: CARDIOVERSION;  Surgeon: Herminio Commons, MD;  [facility];  Service: Cardiovascular;  Laterality: N/A;  . CARDIOVERSION N/A 11/10/2019   Procedure: CARDIOVERSION;  Surgeon: Josue Hector, MD;  Location: [facility];  Service: Cardiovascular;  Laterality: N/A;  . [place]   left  . cataract surgery    . CHOLECYSTECTOMY  1998  . Cholecystectomy    . COLONOSCOPY  03/16/2011   multiple hyperplastic colon polyps, sigmoid diverticulosis, melanosis coli  . COLONOSCOPY WITH PROPOFOL N/A 07/05/2015   RMR:one 5 mm polyp in descending  colon  . CORONARY ANGIOGRAPHY N/A 05/16/2018   Procedure: CORONARY ANGIOGRAPHY (CATH LAB);  Surgeon: Belva Crome, MD;  Location: [facility];  Service: Cardiovascular;  Laterality: N/A;  . CORONARY ANGIOPLASTY WITH STENT PLACEMENT  2015  . ESOPHAGEAL DILATION N/A 07/05/2015   Procedure: ESOPHAGEAL DILATION;  Surgeon: Daneil Dolin, MD;  [facility];  Service: Endoscopy;  Laterality: N/A;  . ESOPHAGOGASTRODUODENOSCOPY (EGD) WITH PROPOFOL N/A 07/05/2015   ZGY:[id number]  . ESOPHAGOGASTRODUODENOSCOPY (EGD) WITH PROPOFOL N/A 02/16/2020   Procedure: ESOPHAGOGASTRODUODENOSCOPY (EGD) WITH PROPOFOL;  Surgeon: Daneil Dolin, MD;  Normal examined esophagus s/p dilation, erythematous mucosa in the stomach s/p biopsy, normal examined duodenum. Pathology with mild gastropathy, negative for H. Pylori.   [name] Kitchen JOINT REPLACEMENT Right 2007   right knee  . left hand surgery    . LEFT HEART CATHETERIZATION WITH CORONARY ANGIOGRAM N/A 05/14/2013   Procedure: LEFT HEART CATHETERIZATION WITH CORONARY ANGIOGRAM;  Surgeon: Blane Ohara, MD;  Location: [facility];  Service: Cardiovascular;  Laterality: N/A;  . left rotator cuff surgery    . LUMBAR LAMINECTOMY/DECOMPRESSION MICRODISCECTOMY N/A 10/11/2012   Procedure: LUMBAR LAMINECTOMY/DECOMPRESSION MICRODISCECTOMY 2 LEVELS;  Surgeon: Floyce Stakes, MD;  Location: [place];  Service: Neurosurgery;  Laterality: N/A;  L3-4 L4-5 Laminectomy  . LUMBAR WOUND DEBRIDEMENT N/A 09/27/2015   Procedure: Exploration of Lumbar Wound w/ Repair CSF Leak/Lumbar Drain Placement;  Surgeon: Leeroy Cha, MD;  Location: [facility];  Service: Neurosurgery;  Laterality: N/A;  . MALONEY DILATION  03/16/2011   Gastritis. No H.pylori on bx. 71F maloney dilation with disruption of  occult cevical esophageal web  . MALONEY DILATION N/A 02/16/2020   Procedure: Venia Minks DILATION;  Surgeon: Daneil Dolin, MD;  [facility];  Service: Endoscopy;  Laterality:  N/A;  . NASAL SINUS SURGERY    . right knee replacement  2007  . right leg benign tumor    . SHOULDER SURGERY Left   . TEE WITHOUT CARDIOVERSION N/A 10/04/2017   Procedure: TRANSESOPHAGEAL ECHOCARDIOGRAM (TEE) WITH PROPOFOL;  Surgeon: Herminio Commons, MD;  [facility];  Service: Cardiovascular;  Laterality:  N/A;  . TEE WITHOUT CARDIOVERSION N/A 11/10/2019   Procedure: TRANSESOPHAGEAL ECHOCARDIOGRAM (TEE);  Surgeon: Josue Hector, MD;  Location: [facility] ENDOSCOPY;  Service: Cardiovascular;  Laterality: N/A;  . TONSILLECTOMY    . unspecified area, hysterectomy  1972   partial     Family History  Problem Relation Age of Onset  . Stroke Mother   . Parkinson's disease Father   . Coronary artery disease Other        family Hx-sons  . Cancer Other   . Stroke Other        family Hx  . Hypertension Other        family Hx  . Diabetes Brother   . Heart disease Son        before age 62  . Diabetes Son   . Stroke Daughter 80  . Colon cancer Grandson        diagnosed 56  . Inflammatory bowel disease Neg Hx      Social History   Socioeconomic History  . Marital status: Divorced    Spouse name: Not on file  . Number of children: 5  . Years of education: Not on file  . Highest education level: Not on file  Occupational History  . Occupation: retired  Tobacco Use  . Smoking status: Former Smoker    Packs/day: 1.00    Years: 64.00    Pack years: 64.00    Types: Cigarettes    Start date: 12/24/1947    Quit date: 11/17/2001    Years since quitting: 18.5  . Smokeless tobacco: Never Used  . Tobacco comment: Quit smoking in 2003  Vaping Use  . Vaping Use: Never used  Substance and Sexual Activity  . Alcohol use: Yes    Alcohol/week: 0.0 standard drinks    Comment: only at new years.   . Drug use: No  . Sexual activity: Never  Other Topics Concern  . Not on file  Social History Narrative   Divorced.   Sister had colon perforation & died from complications in [place], [place]    Social Determinants of Health   Financial Resource Strain: Not on file  Food Insecurity: Not on file  Transportation Needs: Not on file  Physical Activity: Not on file  Stress: Not on file  Social Connections: Not on file  Intimate Partner Violence: Not on file     Ht 5\' 1"  (1.549 m)   Wt 151 lb (68.5 kg)   BMI 28.53 kg/m   Physical Exam:  Chronically ill appearing NAD HEENT: Unremarkable Neck:  No JVD, no thyromegally Lymphatics:  No adenopathy Back:  No CVA tenderness Lungs:  Clear with no wheezes HEART:  Regular rate rhythm, no murmurs, no rubs, no clicks Abd:  soft, positive bowel sounds, no organomegally, no rebound, no guarding Ext:  2 plus pulses, left foot is warm with edema on the dorsal surface along with associated purple blue discoloration and peripheral cyanosis, no clubbing Skin:  No rashes no nodules Neuro:  CN II through XII intact, motor grossly intact  Assess/Plan:  1. Chronic diastolic heart failure - she has gained some weight and has not been taking her lasix. I asked her to go back to lasix 40 mg daily. Her weight is down. 2. NSVT - she is minimally symptomatic.  I would have her continue her toprol. 3. orthostasis - she has only had a single episode. We will follow. I would not hold her Eliquis but if she continues to pass  out would suggest this. 4. Foot swelling - her toes are purple/blueish and swollen as is the dorsum of her foot. I suspect microemboli but I would like her to see Dr. Donnetta Hutching, a vascular expert.   [name] Tonya Cundari,MD

## 2020-05-18 NOTE — Patient Instructions (Signed)
Medication Instructions:  Your physician recommends that you continue on your current medications as directed. Please refer to the Current Medication list given to you today.  *If you need a refill on your cardiac medications before your next appointment, please call your pharmacy*   Lab Work: NONE   If you have labs (blood work) drawn today and your tests are completely normal, you will receive your results only by: [name] Kitchen MyChart Message (if you have MyChart) OR . A paper copy in the mail If you have any lab test that is abnormal or we need to change your treatment, we will call you to review the results.   Testing/Procedures: NONE    Follow-Up: At [facility], you and your health needs are our priority.  As part of our continuing mission to provide you with exceptional heart care, we have created designated Provider Care Teams.  These Care Teams include your primary Cardiologist (physician) and Advanced Practice Providers (APPs -  Physician Assistants and Nurse Practitioners) who all work together to provide you with the care you need, when you need it.  We recommend signing up for the patient portal called "MyChart".  Sign up information is provided on this After Visit Summary.  MyChart is used to connect with patients for Virtual Visits (Telemedicine).  Patients are able to view lab/test results, encounter notes, upcoming appointments, etc.  Non-urgent messages can be sent to your provider as well.   To learn more about what you can do with MyChart, go to [email or web address].    Your next appointment:   1 year(s)  The format for your next appointment:   In Person  Provider:   Cristopher Peru, MD   Other Instructions Keep Left foot elevated

## 2020-05-20 ENCOUNTER — Ambulatory Visit (INDEPENDENT_AMBULATORY_CARE_PROVIDER_SITE_OTHER): Payer: PPO

## 2020-05-20 DIAGNOSIS — R0602 Shortness of breath: Secondary | ICD-10-CM | POA: Diagnosis not present

## 2020-05-20 LAB — ECHOCARDIOGRAM COMPLETE
AR max vel: 1.49 cm2
AV Area VTI: 1.75 cm2
AV Area mean vel: 1.52 cm2
AV Mean grad: 3.6 mmHg
AV Peak grad: 7.4 mmHg
Ao pk vel: 1.36 m/s
Area-P 1/2: 2.39 cm2
Calc EF: 58.9 %
P 1/2 time: 1327 msec
S' Lateral: 2.65 cm
Single Plane A2C EF: 60 %
Single Plane A4C EF: 57.3 %

## 2020-05-21 ENCOUNTER — Telehealth: Payer: Self-pay | Admitting: *Deleted

## 2020-05-21 ENCOUNTER — Other Ambulatory Visit: Payer: Self-pay | Admitting: Cardiology

## 2020-05-21 NOTE — Telephone Encounter (Signed)
Laurine Blazer, LPN  11/15/8561 1:49 PM EST Back to Top     Jeneen Rinks (son) notified. Copy to pcp.

## 2020-05-21 NOTE — Telephone Encounter (Signed)
-----   Message from Merlene Laughter, RN sent at 05/21/2020  1:30 PM EST -----  ----- Message ----- From: Verta Ellen., NP Sent: 05/21/2020   1:14 PM EST To: Merlene Laughter, RN  Please call the patient and let her know the echocardiogram showed she has good pumping function of her heart.  She has a very mildly leaky aortic valve.  Nothing on the echocardiogram which would cause any shortness of breath

## 2020-06-01 DIAGNOSIS — J449 Chronic obstructive pulmonary disease, unspecified: Secondary | ICD-10-CM | POA: Diagnosis not present

## 2020-06-07 ENCOUNTER — Other Ambulatory Visit: Payer: Self-pay

## 2020-06-07 ENCOUNTER — Encounter: Payer: Self-pay | Admitting: Vascular Surgery

## 2020-06-07 ENCOUNTER — Ambulatory Visit (INDEPENDENT_AMBULATORY_CARE_PROVIDER_SITE_OTHER): Payer: PPO | Admitting: Vascular Surgery

## 2020-06-07 ENCOUNTER — Other Ambulatory Visit (HOSPITAL_COMMUNITY): Payer: Self-pay | Admitting: Vascular Surgery

## 2020-06-07 ENCOUNTER — Ambulatory Visit (INDEPENDENT_AMBULATORY_CARE_PROVIDER_SITE_OTHER): Payer: PPO

## 2020-06-07 VITALS — BP 147/65 | HR 64 | Temp 97.5°F | Resp 16 | Ht 61.0 in | Wt 152.6 lb

## 2020-06-07 DIAGNOSIS — I739 Peripheral vascular disease, unspecified: Secondary | ICD-10-CM

## 2020-06-07 DIAGNOSIS — R230 Cyanosis: Secondary | ICD-10-CM

## 2020-06-07 NOTE — Progress Notes (Signed)
Cardiology Office Note  Date: 06/09/2020   ID: [name], DOB 1933-03-22, MRN [id number]  PCP:  Tonya Kaufman, PA-C  Cardiologist:  Tonya Dolly, MD Electrophysiologist:  Tonya Peru, MD   Chief Complaint: Follow-up of atrial fibrillation, CAD, chronic diastolic heart failure, shortness of breath.  History of Present Illness: [name] is a 87 y.o. female with a history of atrial fibrillation, CAD, chronic diastolic heart failure, shortness of breath, CKD, COPD, dyspnea, HTN, GERD, HLD, anemia, PVD, thyroid disease, GERD.  Last seen by Dr. Harl [name] 02/23/2020.  No recent chest pains.  Nuclear stress test July 2021 with no ischemia.  No recent palpitations.  Continuing Toprol and followed by Dr. Lovena Le EP for NSVT.  No edema and weights were stable given history of chronic diastolic heart failure.  Status post TEE/DCCV November 10 2019 x3 shocks resulting in normal sinus rhythm.  Breathing was better since normal sinus rhythm restored.  Recent admission to [facility] for significant anemia with hemoglobin 6.7.  Some ongoing bleeding intermittently along with dysphagia followed by GI.  Shortness of breath mainly with activity using nebulizer twice a day.  She was continuing current therapy for diastolic heart failure.  Hemoglobin was normal 2 weeks prior to visit.  Bradycardia on EKG on that visit.  Diltiazem was lowered to 120 mg daily due to possible contribution to exertional symptoms.   At last visit with me she was here for 43-month follow-up.  She was having significant fatigue and no energy.  She was having some bleeding internal and external hemorrhoids she had a pending colonoscopy with Dr. Gala [name] .Recent H&H on 03/31/2020 showed hemoglobin of 10.9 and hematocrit of 35.  Also complained of increasing dyspnea on exertion.  Stated DOE had been increasing over the last couple of months.  Stated sometimes when walking her chest would become  tight.  She had been falling due to  weakness in her legs.  Due to back surgeries and recent falls she had been experiencing significant pain in her back.  She stated she had been told in the past she could have no further surgeries  due to her heart disease.    Last seen by Dr Lovena Le 05/18/2020: She had developed pain in left foot with swelling over the prior month. On oral anticoagulants. No palpitations. She had gained some weight and was asked to restart lasix at 40 mg daily. NSVT minimally symptomatic and continuing Toprol. Had a single episode of reported syncope. If she continued would consider holding eliquis. Her toes were discolored and  swollen as well as dorsum of foot. Suspect microemboli. Was referred to Dr [name] vascular specialist  She presents today with continued shortness of breath, with chest tightness on exertion.  She denies any radiation or associated symptoms.  She has a fairly significant history of smoking.  Colonoscopy for bleeding is still pending.  She states in the past she was to have a sleep apnea test which was not done.  She states she sleeps on 2 pillows.  Recent episodes of syncope with falls.  She denies any sensation of palpitations.  Continues to complain of significant weakness in her legs.  States her legs give way without notice and she falls.  She is concerned she may have diabetes given family history in her son and a sibling.   Past Medical History:  Diagnosis Date  . Anxiety   . Arthritis   . Atrial fibrillation ([place])   . Bursitis  Left shoulder  . Cataract   . CHF (congestive heart failure) ([place])   . CKD (chronic kidney disease)    stage 3-4  . COPD (chronic obstructive pulmonary disease) ([place])   . Coronary atherosclerosis of native coronary artery    a. s/p DES to LCx in 04/2013 b. cath in 11/2015 showing patent stent with 20% prox-LAD and 80% ostial RCA stenosis for which medical management was recommended due to small artery size  . Depression   . Diastolic heart failure (HCC)     EF 55-60%  . Dysphagia, unspecified(787.20)   . Dyspnea   . Dysrhythmia   . Essential hypertension   . GERD (gastroesophageal reflux disease)    Hx Schatzki's ring, multiple EGD/ED last 01/06/2004  . Gout   . Headache   . History of anemia   . Hyperlipidemia   . Internal hemorrhoids without mention of complication   . MI (myocardial infarction) ([place]) 2006  . Microscopic colitis 2003  . Panic disorder without agoraphobia   . Paresthesia   . Pneumonia 12/2011  . PVD (peripheral vascular disease) ([place])   . S/P colonoscopy 09/27/2001   internal hemorrhoids, desc colon inflam polyp, SB BX-chronic duodenitis, colitis  . Sleep apnea   . Thyroid disease     Past Surgical History:  Procedure Laterality Date  . ABDOMINAL HYSTERECTOMY    . ABDOMINAL HYSTERECTOMY    . ANTERIOR AND POSTERIOR REPAIR     with resection of vagina  . ANTERIOR LAT LUMBAR FUSION N/A 08/01/2016   Procedure: Lumbar Two-Lumbar Five Transpsoas lateral interbody fusion with Lumbar Two-Three lateral plate fixation;  Surgeon: Tonya Ny Ditty, MD;  Location: [place];  Service: Neurosurgery;  Laterality: N/A;  L2-5 Transpsoas lateral interbody fusion with L2-3 lateral plate fixation  . APPENDECTOMY    . BACK SURGERY    . BIOPSY  07/05/2015   Procedure: BIOPSY;  Surgeon: Daneil Dolin, MD;  [facility];  Service: Endoscopy;;  gastric polyp biopsy, ascending colon biopsy  . BIOPSY  02/16/2020   Procedure: BIOPSY;  Surgeon: Daneil Dolin, MD;  [facility];  Service: Endoscopy;;  . BLADDER SUSPENSION  11/09/2011   Procedure: TRANSVAGINAL TAPE (TVT) PROCEDURE;  Surgeon: Tonya Nestle, MD;  Location: [facility];  Service: Urology;  Laterality: N/A;  . bladder tack  06/2010  . BREAST LUMPECTOMY  1998   left, benign  . CARDIAC CATHETERIZATION    . CARDIAC CATHETERIZATION    . CARDIAC CATHETERIZATION N/A 12/16/2015   Procedure: Left Heart Cath and Coronary Angiography;  Surgeon: Tonya Sine, MD;   Location: [place] CV [facility];  Service: Cardiovascular;  Laterality: N/A;  . CARDIOVERSION N/A 10/04/2017   Procedure: CARDIOVERSION;  Surgeon: Herminio Commons, MD;  [facility]: [facility];  Service: Cardiovascular;  Laterality: N/A;  . CARDIOVERSION N/A 01/30/2018   Procedure: CARDIOVERSION;  Surgeon: Herminio Commons, MD;  [facility];  Service: Cardiovascular;  Laterality: N/A;  . CARDIOVERSION N/A 11/10/2019   Procedure: CARDIOVERSION;  Surgeon: Josue Hector, MD;  Location: [place];  Service: Cardiovascular;  Laterality: N/A;  . [place]   left  . cataract surgery    . CHOLECYSTECTOMY  1998  . Cholecystectomy    . COLONOSCOPY  03/16/2011   multiple hyperplastic colon polyps, sigmoid diverticulosis, melanosis coli  . COLONOSCOPY WITH PROPOFOL N/A 07/05/2015   RMR:one 5 mm polyp in descending colon  . CORONARY ANGIOGRAPHY N/A 05/16/2018  Procedure: CORONARY ANGIOGRAPHY (CATH LAB);  Surgeon: Belva Crome, MD;  Location: [place] CV LAB;  Service: Cardiovascular;  Laterality: N/A;  . CORONARY ANGIOPLASTY WITH STENT PLACEMENT  2015  . ESOPHAGEAL DILATION N/A 07/05/2015   Procedure: ESOPHAGEAL DILATION;  Surgeon: Daneil Dolin, MD;  [facility];  Service: Endoscopy;  Laterality: N/A;  . ESOPHAGOGASTRODUODENOSCOPY (EGD) WITH PROPOFOL N/A 07/05/2015   PPJ:[id number]  . ESOPHAGOGASTRODUODENOSCOPY (EGD) WITH PROPOFOL N/A 02/16/2020   Procedure: ESOPHAGOGASTRODUODENOSCOPY (EGD) WITH PROPOFOL;  Surgeon: Daneil Dolin, MD;  Normal examined esophagus s/p dilation, erythematous mucosa in the stomach s/p biopsy, normal examined duodenum. Pathology with mild gastropathy, negative for H. Pylori.   [name] Kitchen JOINT REPLACEMENT Right 2007   right knee  . left hand surgery    . LEFT HEART CATHETERIZATION WITH CORONARY ANGIOGRAM N/A 05/14/2013   Procedure: LEFT HEART CATHETERIZATION WITH CORONARY ANGIOGRAM;  Surgeon: Blane Ohara, MD;  Location: [facility];   Service: Cardiovascular;  Laterality: N/A;  . left rotator cuff surgery    . LUMBAR LAMINECTOMY/DECOMPRESSION MICRODISCECTOMY N/A 10/11/2012   Procedure: LUMBAR LAMINECTOMY/DECOMPRESSION MICRODISCECTOMY 2 LEVELS;  Surgeon: Floyce Stakes, MD;  Location: [place] [facility];  Service: Neurosurgery;  Laterality: N/A;  L3-4 L4-5 Laminectomy  . LUMBAR WOUND DEBRIDEMENT N/A 09/27/2015   Procedure: Exploration of Lumbar Wound w/ Repair CSF Leak/Lumbar Drain Placement;  Surgeon: Leeroy Cha, MD;  Location: [place] NEURO ORS;  Service: Neurosurgery;  Laterality: N/A;  . MALONEY DILATION  03/16/2011   Gastritis. No H.pylori on bx. 13F maloney dilation with disruption of  occult cevical esophageal web  . MALONEY DILATION N/A 02/16/2020   Procedure: Venia Minks DILATION;  Surgeon: Daneil Dolin, MD;  [facility];  Service: Endoscopy;  Laterality: N/A;  . NASAL SINUS SURGERY    . right knee replacement  2007  . right leg benign tumor    . SHOULDER SURGERY Left   . TEE WITHOUT CARDIOVERSION N/A 10/04/2017   Procedure: TRANSESOPHAGEAL ECHOCARDIOGRAM (TEE) WITH PROPOFOL;  Surgeon: Herminio Commons, MD;  [facility];  Service: Cardiovascular;  Laterality: N/A;  . TEE WITHOUT CARDIOVERSION N/A 11/10/2019   Procedure: TRANSESOPHAGEAL ECHOCARDIOGRAM (TEE);  Surgeon: Josue Hector, MD;  [facility] END[facility]COPY;  Service: Cardiovascular;  Laterality: N/A;  . TONSILLECTOMY    . unspecified area, hysterectomy  1972   partial    Current Outpatient Medications  Medication Sig Dispense Refill  . acetaminophen (TYLENOL) 500 MG tablet Take 500 mg by mouth every 6 (six) hours as needed for headache or moderate pain.    [name] Kitchen albuterol (PROVENTIL) (2.5 MG/3ML) 0.083% nebulizer solution Take 2.5 mg by nebulization 2 (two) times daily.    [name] Kitchen ALPRAZolam (XANAX) 0.5 MG tablet Take 0.5 mg by mouth daily as needed for anxiety or sleep.    [name] Kitchen apixaban (ELIQUIS) 5 MG TABS tablet Take 1 tablet (5 mg total) by mouth 2 (two)  times daily. 180 tablet 3  . colestipol (COLESTID) 1 g tablet Take 2 tablets (2 g total) by mouth daily. Administer other medication 1 hour before or 4 hours after colestipol. 60 tablet 0  . diltiazem (CARDIZEM CD) 120 MG 24 hr capsule TAKE ONE CAPSULE BY MOUTH DAILY 90 capsule 1  . DULoxetine (CYMBALTA) 60 MG capsule Take 60 mg by mouth at bedtime.    . fluticasone (CUTIVATE) 0.05 % cream Apply 1 application topically 2 (two) times daily as needed (skin tears).    . furosemide (LASIX) 40 MG tablet  Take 1 tablet (40 mg total) by mouth daily. (Patient taking differently: Take 40 mg by mouth daily.) 30 tablet 1  . hydrocortisone (ANUSOL-HC) 2.5 % rectal cream Place 1 application rectally 2 (two) times daily. 30 g 1  . isosorbide mononitrate (IMDUR) 60 MG 24 hr tablet Take 60 mg by mouth daily.    [name] Kitchen loperamide (IMODIUM A-D) 2 MG tablet Take 4 mg by mouth 2 (two) times daily as needed for diarrhea or loose stools.    . magnesium oxide (MAG-OX) 400 MG tablet TAKE ONE TABLET BY MOUTH DAILY. (Patient taking differently: Take 400 mg by mouth daily.) 90 tablet 3  . metoprolol succinate (TOPROL-XL) 25 MG 24 hr tablet Take 75 mg by mouth daily.    . Multiple Vitamin (MULTIVITAMIN WITH MINERALS) TABS tablet Take 1 tablet by mouth daily. Centrum    . Naphazoline HCl (CLEAR EYES OP) Place 1 drop into both eyes daily as needed (itching).    . nitroGLYCERIN (NITROSTAT) 0.4 MG SL tablet Place 1 tablet (0.4 mg total) under the tongue every 5 (five) minutes as needed for chest pain. Reported on 08/04/2015 25 tablet 6  . pantoprazole (PROTONIX) 40 MG tablet Take 1 tablet (40 mg total) by mouth 2 (two) times daily before a meal. 60 tablet 3  . potassium chloride (KLOR-CON) 10 MEQ tablet Take 1 tablet (10 mEq total) by mouth daily. 180 tablet 1  . sodium chloride (OCEAN) 0.65 % SOLN nasal spray Place 1 spray into both nostrils daily as needed (allergies).     No current facility-administered medications for this visit.    Allergies:  Cephalosporins, Levaquin [levofloxacin in d5w], Macrodantin [nitrofurantoin macrocrystal], Phenothiazines, Polysorbate, Prednisone, Buspirone, Cardura [doxazosin mesylate], Codeine, Acyclovir and related, Colcrys [colchicine], Prochlorperazine, Ranexa [ranolazine], Atorvastatin, Colestipol, Ofloxacin, Other, Penicillins, and Pimozide   Social History: The patient  reports that she quit smoking about 18 years ago. Her smoking use included cigarettes. She started smoking about 72 years ago. She has a 64.00 pack-year smoking history. She has never used smokeless tobacco. She reports previous alcohol use. She reports that she does not use drugs.   Family History: The patient's family history includes Cancer in an other family member; Colon cancer in her grandson; Coronary artery disease in an other family member; Diabetes in her brother and son; Heart disease in her son; Hypertension in an other family member; Parkinson's disease in her father; Stroke in her mother and another family member; Stroke (age of onset: 37) in her daughter.   ROS:  Please see the history of present illness. Otherwise, complete review of systems is positive for none.  All other systems are reviewed and negative.   Physical Exam: VS:  BP 140/60   Pulse 60   Ht 5\' 1"  (1.549 m)   Wt 156 lb 6.4 oz (70.9 kg)   SpO2 98%   BMI 29.55 kg/m , BMI Body mass index is 29.55 kg/m.  Wt Readings from Last 3 Encounters:  06/08/20 156 lb 6.4 oz (70.9 kg)  06/07/20 152 lb 9.6 oz (69.2 kg)  05/18/20 151 lb (68.5 kg)    General: Patient appears comfortable at rest. Neck: Supple, no elevated JVP or carotid bruits, no thyromegaly. Lungs: mild inspiratory /expiratory wheezing on auscultation, nonlabored breathing at rest. Cardiac: Regular rate and rhythm, no S3 or significant systolic murmur, no pericardial rub. Extremities: No pitting edema, distal pulses 2+. Skin: Warm and dry. Musculoskeletal: No  kyphosis. Neuropsychiatric: Alert and oriented x3, affect grossly appropriate.  ECG:  Recent Labwork: 09/19/2019: ALT 13; AST 20 09/22/2019: Magnesium 1.8 11/09/2019: B Natriuretic Peptide 365.4 03/31/2020: BUN 20; Creatinine, Ser 1.31; Hemoglobin 10.9; Platelets 354; Potassium 4.5; Sodium 134; TSH 1.903     Component Value Date/Time   CHOL 152 11/10/2019 0502   TRIG 82 11/10/2019 0502   HDL 38 (L) 11/10/2019 0502   CHOLHDL 4.0 11/10/2019 0502   VLDL 16 11/10/2019 0502   LDLCALC 98 11/10/2019 0502    Other Studies Reviewed Today:  Echocardiogram 05/20/2020 1. Left ventricular ejection fraction, by estimation, is 60 to 65%. The left ventricle has normal function. The left ventricle has no regional wall motion abnormalities. Left ventricular diastolic parameters are indeterminate. 2. Right ventricular systolic function is normal. The right ventricular size is normal. 3. The mitral valve is normal in structure. No evidence of mitral valve regurgitation. No evidence of mitral stenosis. 4. The aortic valve has an indeterminant number of cusps. There is mild calcification of the aortic valve. There is mild thickening of the aortic valve. Aortic valve regurgitation is trivial. No aortic stenosis is present. 5. The inferior vena cava is normal in size with greater than 50% respiratory variability, suggesting right atrial pressure of 3 mmHg. Comparison(s): Echocardiogram done 09/22/19 showed an EF of 60-65%.    Diagnostic Studies Cardiac Catheterization: 2018/05/21  Patent short left main  20 to 30% proximal LAD irregularity, with diffuse 20% narrowing within the mid vessel.  Patent proximal circumflex stent with mid to distal circumflex irregularities up to 30 to 40%.  Nondominant right coronary with ostial 90% narrowing.  No hemodynamics recorded. Left ventriculography was not performed in an effort to decrease contrast exposure in the setting of  CKD.  RECOMMENDATIONS:   Compared to prior angiography in 2017, no significant changes noted.  Chest pain is atypical and may be more musculoskeletal or pleuritic.  Resume Eliquis in 12 hours/a.m. of 05/17/2018  Echocardiogram 10/02/18: 1. The left ventricle has normal systolic function with an ejection  fraction of 60-65%. The cavity size was normal. There is mildly increased  left ventricular wall thickness. Left ventricular diastolic Doppler  parameters are consistent with impaired  relaxation. Elevated mean left atrial pressure.  2. The right ventricle has normal systolic function. The cavity was  normal. There is no increase in right ventricular wall thickness.  3. Left atrial size was mildly dilated.  4. The aortic valve is tricuspid. Mild thickening of the aortic valve.  Mild calcification of the aortic valve. Aortic valve regurgitation is mild  by color flow Doppler. No stenosis of the aortic valve. Mild aortic  annular calcification noted.  5. The mitral valve is abnormal. Mild thickening of the mitral valve  leaflet. Mild calcification of the mitral valve leaflet. There is mild  mitral annular calcification present. No evidence of mitral valve  stenosis.  6. The aortic root is normal in size and structure.  7. Pulmonary hypertension is indeterminant, inadequate TR jet.   Event monitor 05/13/19:  1. NSR  2. NSVT, lasting upto 6 beats 3. No prolonged pauses or bradycardia 4. No atrial fib   09/2019 echo 1. Left ventricular ejection fraction, by estimation, is 60 to 65%. The  left ventricle has normal function. The left ventricle has no regional  wall motion abnormalities. There is mild left ventricular hypertrophy.  Left ventricular diastolic parameters  are indeterminate.  2. Right ventricular systolic function is normal. The right ventricular  size is normal. There is normal pulmonary artery systolic pressure.  3. The mitral  valve is normal in  structure. Trivial mitral valve  regurgitation. No evidence of mitral stenosis.  4. The aortic valve was not well visualized. Aortic valve regurgitation  is mild. No aortic stenosis is present.    Assessment and Plan:  1. Exertional chest pain   2. Syncope, unspecified syncope type   3. Chronic diastolic heart failure ([place])   4. SOB (shortness of breath)   5. NSVT (nonsustained ventricular tachycardia) (HCC)   6. Suspected sleep apnea   7. History of tobacco abuse   8. Screening for diabetes mellitus (DM)    1. CAD in native artery/ exertional chest pain Complaining of increasing chest tightness when performing more than usual activity beyond her ADLs.  She states just walking from her car into the clinic caused chest tightness.  Please get a Lexiscan stress test.  Continue Imdur 60 mg daily.  Continue nitroglycerin sublingual as needed.  Continue Toprol 75 mg p.o. daily.  Not on aspirin due to systemic anticoagulation for atrial fibrillation  2. Chronic diastolic heart failure (HCC) Reports increasing dyspnea on exertion.  Recent echocardiogram with LVEF of 60 to 65%.  No WMA's.  Trivial aortic regurgitation.  Continue Lasix 40 mg daily.  Continue Toprol 75 mg daily.   3. SOB (shortness of breath) States she has been having increasing shortness of breath/dyspnea on exertion over the last few months which seems to be worsening. Recent echocardiogram with LVEF of 60-65%.  No WMA's.  Trivial aortic regurgitation.   4. Atrial fibrillation, unspecified type ([place]) Denies any significant episodes of tachycardia, palpitations or arrhythmias.  Continue metoprolol XL 75 mg daily.  Continue diltiazem 120 mg daily.  Continue Eliquis 5 mg p.o. twice daily.  5.  Fatigue/low energy/history of anemia Please get a follow-up CBC, bmet and thyroid panel secondary to recent increasing fatigue and low energy with history of anemia.  Patient states she has history of bleeding hemorrhoids internal and  external.  She has an upcoming EGD with Dr. Gala [name].  Last hemoglobin on 03/31/2020 was 10.9 with hematocrit of 35.8.  6.  Back pain/falls Patient states she is having recent falls x2.  Describes it as her legs giving way on her without warning.  She has chronic back issues and has previous history of surgeries on her back.  Her chronic back pain seems to be worsening.  Please refer her back to [facility] neurology for evaluation of back pain and falls.  7. Screening for diabetes She is concerned she may have diabetes due to having one child with diabetes and one sibling with diabetes.  Please get a hemoglobin A1c.  8.  Suspected sleep apnea/dyspnea/smoking Patient states she had previously been ordered to have a sleep study but she had surgeries which precluded her from having the test.  Please have her see pulmonology for evaluation and management of possible sleep apnea.  She also needs evaluation for dyspnea and history of smoking  9.  Syncope / Hx of NSVT / Atrial fibrillation s/p DCCV Recent episodes of syncope with falls.  Has a history of atrial fibrillation status post DCCV.  On last EKG recording  it appeared she had some sinus pauses with bradycardia with a rate of 47.  She recently saw Dr. Lovena Le for follow up of NSVT. Please get a 14 day Zio monitor.  Medication Adjustments/Labs and Tests Ordered: Current medicines are reviewed at length with the patient today.  Concerns regarding medicines are outlined above.   Disposition: Follow-up with Dr. Harl [name] or  [facility] 6 weeks  Signed, Levell July, NP 06/09/2020 9:24 PM    [facility] [place], [place], [place] [address] Phone: [phone]; Fax: [phone]

## 2020-06-07 NOTE — Progress Notes (Signed)
[facility] [place] [facility]  Patient name: [name] MRN: [id number] DOB: 1933-01-24 Sex: female  REASON FOR CONSULT: Evaluation lower extremity complaints  HPI: [name] is a 87 y.o. female, who is here today for evaluation.  She is here with her daughter.  She has multiple complaints.  She reports pain and swelling in both lower extremities.  This is initially is at her ankle but can extend up to her knees as well.  She is on Lasix.  She reports feeling of hot and cold sensations in her feet and generalized stinging in her feet bilaterally.  She reports that her lower extremities are numb from her knees distally and this is chronic.  She has been treated for gout and reports that she was hospitalized due to reaction to the Medication.  She has developed ongoing generalized weakness in both lower extremities and her daughter reports that her legs will simply "give out" when she is standing.  She has bedside commode and walks with a walker and is in a wheelchair when she is out and about.  She has no lower extremity tissue loss.  Past Medical History:  Diagnosis Date  . Anxiety   . Arthritis   . Atrial fibrillation ([place])   . Bursitis    Left shoulder  . Cataract   . CHF (congestive heart failure) ([place])   . CKD (chronic kidney disease)    stage 3-4  . COPD (chronic obstructive pulmonary disease) ([place])   . Coronary atherosclerosis of native coronary artery    a. s/p DES to LCx in 04/2013 b. cath in 11/2015 showing patent stent with 20% prox-LAD and 80% ostial RCA stenosis for which medical management was recommended due to small artery size  . Depression   . Diastolic heart failure (HCC)    EF 55-60%  . Dysphagia, unspecified(787.20)   . Dyspnea   . Dysrhythmia   . Essential hypertension   . GERD (gastroesophageal reflux disease)    Hx Schatzki's ring, multiple EGD/ED last 01/06/2004  . Gout   . Headache   . History of  anemia   . Hyperlipidemia   . Internal hemorrhoids without mention of complication   . MI (myocardial infarction) ([place]) 2006  . Microscopic colitis 2003  . Panic disorder without agoraphobia   . Paresthesia   . Pneumonia 12/2011  . PVD (peripheral vascular disease) ([place])   . S/P colonoscopy 09/27/2001   internal hemorrhoids, desc colon inflam polyp, SB BX-chronic duodenitis, colitis  . Sleep apnea   . Thyroid disease     Family History  Problem Relation Age of Onset  . Stroke Mother   . Parkinson's disease Father   . Coronary artery disease Other        family Hx-sons  . Cancer Other   . Stroke Other        family Hx  . Hypertension Other        family Hx  . Diabetes Brother   . Heart disease Son        before age 77  . Diabetes Son   . Stroke Daughter 54  . Colon cancer Grandson        diagnosed 71  . Inflammatory bowel disease Neg Hx     SOCIAL HISTORY: Social History   Socioeconomic History  . Marital status: Divorced    Spouse name: Not on file  . Number of children: 5  . Years of education: Not on file  .  Highest education level: Not on file  Occupational History  . Occupation: retired  Tobacco Use  . Smoking status: Former Smoker    Packs/day: 1.00    Years: 64.00    Pack years: 64.00    Types: Cigarettes    Start date: 12/24/1947    Quit date: 11/17/2001    Years since quitting: 18.5  . Smokeless tobacco: Never Used  . Tobacco comment: Quit smoking in 2003  Vaping Use  . Vaping Use: Never used  Substance and Sexual Activity  . Alcohol use: Not Currently    Alcohol/week: 0.0 standard drinks    Comment: only at new years.   . Drug use: No  . Sexual activity: Never  Other Topics Concern  . Not on file  Social History Narrative   Divorced.   Sister had colon perforation & died from complications in [place], [place]   Social Determinants of Health   Financial Resource Strain: Not on file  Food Insecurity: Not on file  Transportation Needs: Not on file   Physical Activity: Not on file  Stress: Not on file  Social Connections: Not on file  Intimate Partner Violence: Not on file    Allergies  Allergen Reactions  . Cephalosporins Diarrhea and Nausea Only    Lightheaded  . Levaquin [Levofloxacin In D5w] Swelling  . Macrodantin [Nitrofurantoin Macrocrystal] Swelling  . Phenothiazines Anaphylaxis and Hives  . Polysorbate Anaphylaxis  . Prednisone Shortness Of Breath  . Buspirone Itching  . Cardura [Doxazosin Mesylate] Itching  . Codeine Itching  . Acyclovir And Related Itching    Redness of skin  . Colcrys [Colchicine] Nausea Only    Upset stomach   . Prochlorperazine Other (See Comments)    "Upset stomach"  . Ranexa [Ranolazine]     Severe drop in BP  . Atorvastatin Hives    Cramping; tolerates Crestor ok  . Colestipol Palpitations  . Ofloxacin Rash  . Other Itching and Rash    "WOOL"= make skin look like it has been burned  . Penicillins Other (See Comments)    Causes redness all over. Has patient had a PCN reaction causing immediate rash, facial/tongue/throat swelling, SOB or lightheadedness with hypotension: No Has patient had a PCN reaction causing severe rash involving mucus membranes or skin necrosis: No Has patient had a PCN reaction that required hospitalization No Has patient had a PCN reaction occurring within the last 10 years: No If all of the above answers are "NO", then may proceed with Cephalosporin use.   . Pimozide Hives and Itching    Current Outpatient Medications  Medication Sig Dispense Refill  . acetaminophen (TYLENOL) 500 MG tablet Take 500 mg by mouth every 6 (six) hours as needed for headache or moderate pain.    [name] Kitchen albuterol (PROVENTIL) (2.5 MG/3ML) 0.083% nebulizer solution Take 2.5 mg by nebulization 2 (two) times daily.    [name] Kitchen ALPRAZolam (XANAX) 0.5 MG tablet Take 0.5 mg by mouth daily as needed for anxiety or sleep.    [name] Kitchen apixaban (ELIQUIS) 5 MG TABS tablet Take 1 tablet (5 mg total) by mouth 2  (two) times daily. 180 tablet 3  . colestipol (COLESTID) 1 g tablet Take 2 tablets (2 g total) by mouth daily. Administer other medication 1 hour before or 4 hours after colestipol. 60 tablet 0  . diltiazem (CARDIZEM CD) 120 MG 24 hr capsule TAKE ONE CAPSULE BY MOUTH DAILY 90 capsule 1  . DULoxetine (CYMBALTA) 60 MG capsule Take 60 mg by mouth at  bedtime.    . fluticasone (CUTIVATE) 0.05 % cream Apply 1 application topically 2 (two) times daily as needed (skin tears).    . furosemide (LASIX) 40 MG tablet Take 1 tablet (40 mg total) by mouth daily. (Patient taking differently: Take 40 mg by mouth daily.) 30 tablet 1  . loperamide (IMODIUM A-D) 2 MG tablet Take 4 mg by mouth 2 (two) times daily as needed for diarrhea or loose stools.    . magnesium oxide (MAG-OX) 400 MG tablet TAKE ONE TABLET BY MOUTH DAILY. (Patient taking differently: Take 400 mg by mouth daily.) 90 tablet 3  . metoprolol succinate (TOPROL-XL) 25 MG 24 hr tablet Take 75 mg by mouth daily.    . Multiple Vitamin (MULTIVITAMIN WITH MINERALS) TABS tablet Take 1 tablet by mouth daily. Centrum    . Naphazoline HCl (CLEAR EYES OP) Place 1 drop into both eyes daily as needed (itching).    . pantoprazole (PROTONIX) 40 MG tablet Take 1 tablet (40 mg total) by mouth 2 (two) times daily before a meal. 60 tablet 3  . potassium chloride (KLOR-CON) 10 MEQ tablet Take 1 tablet (10 mEq total) by mouth daily. 180 tablet 1  . sodium chloride (OCEAN) 0.65 % SOLN nasal spray Place 1 spray into both nostrils daily as needed (allergies).    . hydrocortisone (ANUSOL-HC) 2.5 % rectal cream Place 1 application rectally 2 (two) times daily. (Patient not taking: Reported on 06/07/2020) 30 g 1  . isosorbide mononitrate (IMDUR) 60 MG 24 hr tablet Take 1 tablet (60 mg total) by mouth daily. 90 tablet 3  . nitroGLYCERIN (NITROSTAT) 0.4 MG SL tablet Place 1 tablet (0.4 mg total) under the tongue every 5 (five) minutes as needed for chest pain. Reported on 08/04/2015  (Patient not taking: Reported on 06/07/2020) 25 tablet 6   No current facility-administered medications for this visit.    REVIEW OF SYSTEMS:  [X]  denotes positive finding, [ ]  denotes negative finding Cardiac  Comments:  Chest pain or chest pressure: x   Shortness of breath upon exertion: x   Short of breath when lying flat: x   Irregular heart rhythm: x       Vascular    Pain in calf, thigh, or hip brought on by ambulation: x   Pain in feet at night that wakes you up from your sleep:  x   Blood clot in your veins:    Leg swelling:  x       Pulmonary    Oxygen at home:    Productive cough:     Wheezing:  x       Neurologic    Sudden weakness in arms or legs:  x   Sudden numbness in arms or legs:  x   Sudden onset of difficulty speaking or slurred speech: x   Temporary loss of vision in one eye:     Problems with dizziness:  x       Gastrointestinal    Blood in stool:  x   Vomited blood:         Genitourinary    Burning when urinating:  x   Blood in urine:        Psychiatric    Major depression:         Hematologic    Bleeding problems:    Problems with blood clotting too easily:        Skin    Rashes or ulcers: x  Constitutional    Fever or chills: x     PHYSICAL EXAM: Vitals:   06/07/20 1520  BP: (!) 147/65  Pulse: 64  Resp: 16  Temp: (!) 97.5 F (36.4 C)  TempSrc: Temporal  SpO2: 97%  Weight: 152 lb 9.6 oz (69.2 kg)  Height: 5\' 1"  (1.549 m)    GENERAL: The patient is a well-nourished female, in no acute distress. The vital signs are documented above. CARDIOVASCULAR: Carotid arteries without bruits bilaterally.  2+ radial pulses bilaterally.  She has a 2+ left dorsalis pedis pulse.  I do not palpate pedal pulses on the right PULMONARY: There is good air exchange  MUSCULOSKELETAL: There are no major deformities or cyanosis. NEUROLOGIC: No focal weakness or paresthesias are detected. SKIN: There are no ulcers or rashes noted. PSYCHIATRIC:  The patient has a normal affect.  DATA:  Noninvasive studies reveal ankle arm index 1.77 on the right and 0.73 on the left  MEDICAL ISSUES: Had long discussion with the patient and her daughter.  I do not think that she has any evidence of significant arterial insufficiency to cause her symptoms.  She does not walk to the level of claudication.  She has an easily palpable dorsalis pedis pulse on the left.  She does appear to have some issues with increased volume causing leg swelling and this is been treated with diuresis.  I also did discuss elevation when possible and also potential use of compression garments should she have worsening swelling.  Her swelling currently is well controlled.  I would not recommend any further evaluation treatment or follow-up.   Rosetta Posner, MD FACS [facility] [phone] Pager [phone]  Note: Portions of this report may have been transcribed using voice recognition software.  Every effort has been made to ensure accuracy; however, inadvertent computerized transcription errors may still be present.

## 2020-06-08 ENCOUNTER — Encounter: Payer: Self-pay | Admitting: *Deleted

## 2020-06-08 ENCOUNTER — Telehealth: Payer: Self-pay | Admitting: Family Medicine

## 2020-06-08 ENCOUNTER — Encounter: Payer: Self-pay | Admitting: Family Medicine

## 2020-06-08 ENCOUNTER — Ambulatory Visit (INDEPENDENT_AMBULATORY_CARE_PROVIDER_SITE_OTHER): Payer: PPO

## 2020-06-08 ENCOUNTER — Other Ambulatory Visit: Payer: Self-pay | Admitting: Family Medicine

## 2020-06-08 ENCOUNTER — Ambulatory Visit (INDEPENDENT_AMBULATORY_CARE_PROVIDER_SITE_OTHER): Payer: PPO | Admitting: Family Medicine

## 2020-06-08 VITALS — BP 140/60 | HR 60 | Ht 61.0 in | Wt 156.4 lb

## 2020-06-08 DIAGNOSIS — I472 Ventricular tachycardia: Secondary | ICD-10-CM

## 2020-06-08 DIAGNOSIS — R0602 Shortness of breath: Secondary | ICD-10-CM | POA: Diagnosis not present

## 2020-06-08 DIAGNOSIS — Z131 Encounter for screening for diabetes mellitus: Secondary | ICD-10-CM

## 2020-06-08 DIAGNOSIS — Z87891 Personal history of nicotine dependence: Secondary | ICD-10-CM

## 2020-06-08 DIAGNOSIS — R7309 Other abnormal glucose: Secondary | ICD-10-CM

## 2020-06-08 DIAGNOSIS — I4729 Other ventricular tachycardia: Secondary | ICD-10-CM

## 2020-06-08 DIAGNOSIS — R55 Syncope and collapse: Secondary | ICD-10-CM | POA: Diagnosis not present

## 2020-06-08 DIAGNOSIS — I951 Orthostatic hypotension: Secondary | ICD-10-CM

## 2020-06-08 DIAGNOSIS — R079 Chest pain, unspecified: Secondary | ICD-10-CM

## 2020-06-08 DIAGNOSIS — I5032 Chronic diastolic (congestive) heart failure: Secondary | ICD-10-CM | POA: Diagnosis not present

## 2020-06-08 DIAGNOSIS — I4891 Unspecified atrial fibrillation: Secondary | ICD-10-CM

## 2020-06-08 DIAGNOSIS — R29818 Other symptoms and signs involving the nervous system: Secondary | ICD-10-CM | POA: Diagnosis not present

## 2020-06-08 DIAGNOSIS — R2242 Localized swelling, mass and lump, left lower limb: Secondary | ICD-10-CM

## 2020-06-08 NOTE — Patient Instructions (Addendum)
Medication Instructions:   Your physician recommends that you continue on your current medications as directed. Please refer to the Current Medication list given to you today.  Labwork:  Your physician recommends that you return for lab work. HgA1C. Please have done at [facility].  Testing/Procedures: Your physician has requested that you have a lexiscan myoview. For further information please visit [email or web address]. Please follow instruction sheet, as given. ZIO- Long Term Monitor Instructions   Your physician has requested you wear your ZIO patch monitor 14 days.   This is a single patch monitor.  [employer] supplies one patch monitor per enrollment.  Additional stickers are not available.   Please do not apply patch if you will be having a Nuclear Stress Test, Echocardiogram, Cardiac CT, MRI, or Chest Xray during the time frame you would be wearing the monitor. The patch cannot be worn during these tests.  You cannot remove and re-apply the ZIO XT patch monitor.   Your ZIO patch monitor will be sent USPS Priority mail from [facility] directly to your home address. The monitor may also be mailed to a PO BOX if home delivery is not available.   It may take 3-5 days to receive your monitor after you have been enrolled.   Once you have received you monitor, please review enclosed instructions.  Your monitor has already been registered assigning a specific monitor serial # to you.   Applying the monitor   Shave hair from upper left chest.   Hold abrader disc by orange tab.  Rub abrader in 40 strokes over left upper chest as indicated in your monitor instructions.   Clean area with 4 enclosed alcohol pads .  Use all pads to assure are is cleaned thoroughly.  Let dry.   Apply patch as indicated in monitor instructions.  Patch will be place under collarbone on left side of chest with arrow pointing upward.   Rub patch adhesive wings for 2 minutes.Remove white label marked  "1".  Remove white label marked "2".  Rub patch adhesive wings for 2 additional minutes.   While looking in a mirror, press and release button in center of patch.  A small green light will flash 3-4 times .  This will be your only indicator the monitor has been turned on.     Do not shower for the first 24 hours.  You may shower after the first 24 hours.   Press button if you feel a symptom. You will hear a small click.  Record Date, Time and Symptom in the Patient Log Book.   When you are ready to remove patch, follow instructions on last 2 pages of Patient Log Book.  Stick patch monitor onto last page of Patient Log Book.   Place Patient Log Book in [place] box.  Use locking tab on box and tape box closed securely.  The [employer] has [employer] on it.  Please place in mailbox as soon as possible.  Your physician should have your test results approximately 7 days after the monitor has been mailed back to [facility].   Call [place] at [phone] if you have questions regarding your ZIO XT patch monitor.  Call them immediately if you see an orange light blinking on your monitor.    If your monitor falls off in less than 4 days contact our Monitor department at [phone].  If your monitor becomes loose or falls off after 4 days call [employer] at [phone]  for suggestions on securing your monitor.  Follow-Up:  Your physician recommends that you schedule a follow-up appointment in: 6-8 weeks.  Any Other Special Instructions Will Be Listed Below (If Applicable).  You have been referred to Pulmonology.  If you need a refill on your cardiac medications before your next appointment, please call your pharmacy.

## 2020-06-08 NOTE — Telephone Encounter (Signed)
PERCERT:  14 Day ZIO  

## 2020-06-09 ENCOUNTER — Encounter: Payer: Self-pay | Admitting: Family Medicine

## 2020-06-14 DIAGNOSIS — E039 Hypothyroidism, unspecified: Secondary | ICD-10-CM | POA: Diagnosis not present

## 2020-06-14 DIAGNOSIS — J449 Chronic obstructive pulmonary disease, unspecified: Secondary | ICD-10-CM | POA: Diagnosis not present

## 2020-06-14 DIAGNOSIS — I25119 Atherosclerotic heart disease of native coronary artery with unspecified angina pectoris: Secondary | ICD-10-CM | POA: Diagnosis not present

## 2020-06-14 DIAGNOSIS — I4891 Unspecified atrial fibrillation: Secondary | ICD-10-CM | POA: Diagnosis not present

## 2020-06-14 DIAGNOSIS — I10 Essential (primary) hypertension: Secondary | ICD-10-CM | POA: Diagnosis not present

## 2020-06-16 ENCOUNTER — Other Ambulatory Visit: Admission: RE | Admit: 2020-06-16 | Payer: PPO | Source: Ambulatory Visit | Admitting: *Deleted

## 2020-06-16 ENCOUNTER — Other Ambulatory Visit (HOSPITAL_COMMUNITY)
Admission: RE | Admit: 2020-06-16 | Discharge: 2020-06-16 | Disposition: A | Discharge status: Home / Self Care | Payer: PPO | Source: Ambulatory Visit | Attending: Family Medicine | Admitting: Family Medicine

## 2020-06-16 ENCOUNTER — Encounter (HOSPITAL_COMMUNITY)
Admission: RE | Admit: 2020-06-16 | Discharge: 2020-06-16 | Disposition: A | Discharge status: Home / Self Care | Payer: PPO | Source: Ambulatory Visit | Attending: Family Medicine | Admitting: Family Medicine

## 2020-06-16 ENCOUNTER — Other Ambulatory Visit: Payer: Self-pay

## 2020-06-16 ENCOUNTER — Encounter (HOSPITAL_BASED_OUTPATIENT_CLINIC_OR_DEPARTMENT_OTHER)
Admission: RE | Admit: 2020-06-16 | Discharge: 2020-06-16 | Disposition: A | Discharge status: Home / Self Care | Payer: PPO | Source: Ambulatory Visit | Attending: Family Medicine | Admitting: Family Medicine

## 2020-06-16 DIAGNOSIS — R0602 Shortness of breath: Secondary | ICD-10-CM | POA: Insufficient documentation

## 2020-06-16 DIAGNOSIS — D649 Anemia, unspecified: Secondary | ICD-10-CM | POA: Insufficient documentation

## 2020-06-16 DIAGNOSIS — R5383 Other fatigue: Secondary | ICD-10-CM | POA: Insufficient documentation

## 2020-06-16 DIAGNOSIS — Z131 Encounter for screening for diabetes mellitus: Secondary | ICD-10-CM

## 2020-06-16 DIAGNOSIS — R079 Chest pain, unspecified: Secondary | ICD-10-CM | POA: Insufficient documentation

## 2020-06-16 DIAGNOSIS — Z79899 Other long term (current) drug therapy: Secondary | ICD-10-CM

## 2020-06-16 LAB — NM MYOCAR MULTI W/SPECT W/WALL MOTION / EF
LV dias vol: 60 mL (ref 46–106)
LV sys vol: 18 mL
Peak HR: 71 {beats}/min
RATE: 0.02
Rest HR: 54 {beats}/min
SDS: 0
SRS: 1
SSS: 1
TID: 1.03

## 2020-06-16 LAB — HEMOGLOBIN A1C
Hgb A1c MFr Bld: 7 % — ABNORMAL HIGH (ref 4.8–5.6)
Mean Plasma Glucose: 154.2 mg/dL

## 2020-06-16 LAB — CBC
HCT: 32.4 % — ABNORMAL LOW (ref 36.0–46.0)
Hemoglobin: 9.8 g/dL — ABNORMAL LOW (ref 12.0–15.0)
MCH: 24.2 pg — ABNORMAL LOW (ref 26.0–34.0)
MCHC: 30.2 g/dL (ref 30.0–36.0)
MCV: 80 fL (ref 80.0–100.0)
Platelets: 403 10*3/uL — ABNORMAL HIGH (ref 150–400)
RBC: 4.05 MIL/uL (ref 3.87–5.11)
RDW: 16 % — ABNORMAL HIGH (ref 11.5–15.5)
WBC: 7 10*3/uL (ref 4.0–10.5)
nRBC: 0 % (ref 0.0–0.2)

## 2020-06-16 LAB — T4, FREE: Free T4: 0.96 ng/dL (ref 0.61–1.12)

## 2020-06-16 LAB — TSH: TSH: 3.187 u[IU]/mL (ref 0.350–4.500)

## 2020-06-16 LAB — BASIC METABOLIC PANEL
Anion gap: 10 (ref 5–15)
BUN: 20 mg/dL (ref 8–23)
CO2: 24 mmol/L (ref 22–32)
Calcium: 9 mg/dL (ref 8.9–10.3)
Chloride: 101 mmol/L (ref 98–111)
Creatinine, Ser: 1.15 mg/dL — ABNORMAL HIGH (ref 0.44–1.00)
GFR, Estimated: 47 mL/min — ABNORMAL LOW (ref >60)
Glucose, Bld: 128 mg/dL — ABNORMAL HIGH (ref 70–99)
Potassium: 4 mmol/L (ref 3.5–5.1)
Sodium: 135 mmol/L (ref 135–145)

## 2020-06-16 MED ORDER — REGADENOSON 0.4 MG/5ML IV SOLN
INTRAVENOUS | Status: AC
  Administered 2020-06-16: 0.4 mg via INTRAVENOUS
  Filled 2020-06-16: qty 5

## 2020-06-16 MED ORDER — TECHNETIUM TC 99M TETROFOSMIN IV KIT
10.0000 | PACK | Freq: Once | INTRAVENOUS | Status: AC | PRN
  Administered 2020-06-16: 10.9 via INTRAVENOUS

## 2020-06-16 MED ORDER — SODIUM CHLORIDE FLUSH 0.9 % IV SOLN
INTRAVENOUS | Status: AC
  Administered 2020-06-16: 10 mL via INTRAVENOUS
  Filled 2020-06-16: qty 10

## 2020-06-16 MED ORDER — TECHNETIUM TC 99M TETROFOSMIN IV KIT
30.0000 | PACK | Freq: Once | INTRAVENOUS | Status: AC | PRN
  Administered 2020-06-16: 30 via INTRAVENOUS

## 2020-06-17 LAB — T3, FREE: T3, Free: 2.7 pg/mL (ref 2.0–4.4)

## 2020-06-18 DIAGNOSIS — R55 Syncope and collapse: Secondary | ICD-10-CM | POA: Diagnosis not present

## 2020-06-24 DIAGNOSIS — M25561 Pain in right knee: Secondary | ICD-10-CM | POA: Diagnosis not present

## 2020-06-24 DIAGNOSIS — R197 Diarrhea, unspecified: Secondary | ICD-10-CM | POA: Diagnosis not present

## 2020-06-24 NOTE — Patient Instructions (Signed)
[name]  06/24/2020     @PREFPERIOPPHARMACY @   Your procedure is scheduled on  06/28/2020   Report to [name] at  [place].M.   Call this number if you have problems the morning of surgery:  [phone]   Remember:  Follow the diet and prep instructions given to you by the office.                       Take these medicines the morning of surgery with A SIP OF WATER    Xanax(if needed), cardiazem, imdur, metoprolol, protonix.  Use your nebulizer before you come.  Your last dose of eliquis should be 06/25/2020.     Please brush your teeth.  Do not wear jewelry, make-up or nail polish.  Do not wear lotions, powders, or perfumes, or deodorant.  Do not shave 48 hours prior to surgery.  Men may shave face and neck.  Do not bring valuables to the hospital.  [facility] is not responsible for any belongings or valuables.  Contacts, dentures or bridgework may not be worn into surgery.  Leave your suitcase in the car.  After surgery it may be brought to your room.  For patients admitted to the hospital, discharge time will be determined by your treatment team.  Patients discharged the day of surgery will not be allowed to drive home and must have someone with them for 24 hours.   Special instructions:  DO NOT smoke tobacco or vape the morning of your procedure.  Please read over the following fact sheets that you were given. Anesthesia Post-op Instructions and Care and Recovery After Surgery       Colonoscopy, Adult, Care After This sheet gives you information about how to care for yourself after your procedure. Your health care provider may also give you more specific instructions. If you have problems or questions, contact your health care provider. What can I expect after the procedure? After the procedure, it is common to have:  A small amount of blood in your stool for 24 hours after the procedure.  Some gas.  Mild cramping or bloating of your  abdomen. Follow these instructions at home: Eating and drinking  Drink enough fluid to keep your urine pale yellow.  Follow instructions from your health care provider about eating or drinking restrictions.  Resume your normal diet as instructed by your health care provider. Avoid heavy or fried foods that are hard to digest.   Activity  Rest as told by your health care provider.  Avoid sitting for a long time without moving. Get up to take short walks every 1-2 hours. This is important to improve blood flow and breathing. Ask for help if you feel weak or unsteady.  Return to your normal activities as told by your health care provider. Ask your health care provider what activities are safe for you. Managing cramping and bloating  Try walking around when you have cramps or feel bloated.  Apply heat to your abdomen as told by your health care provider. Use the heat source that your health care provider recommends, such as a moist heat pack or a heating pad. ? Place a towel between your skin and the heat source. ? Leave the heat on for 20-30 minutes. ? Remove the heat if your skin turns bright red. This is especially important if you are unable to feel pain, heat, or cold. You may have a greater  risk of getting burned.   General instructions  If you were given a sedative during the procedure, it can affect you for several hours. Do not drive or operate machinery until your health care provider says that it is safe.  For the first 24 hours after the procedure: ? Do not sign important documents. ? Do not drink alcohol. ? Do your regular daily activities at a slower pace than normal. ? Eat soft foods that are easy to digest.  Take over-the-counter and prescription medicines only as told by your health care provider.  Keep all follow-up visits as told by your health care provider. This is important. Contact a health care provider if:  You have blood in your stool 2-3 days after the  procedure. Get help right away if you have:  More than a small spotting of blood in your stool.  Large blood clots in your stool.  Swelling of your abdomen.  Nausea or vomiting.  A fever.  Increasing pain in your abdomen that is not relieved with medicine. Summary  After the procedure, it is common to have a small amount of blood in your stool. You may also have mild cramping and bloating of your abdomen.  If you were given a sedative during the procedure, it can affect you for several hours. Do not drive or operate machinery until your health care provider says that it is safe.  Get help right away if you have a lot of blood in your stool, nausea or vomiting, a fever, or increased pain in your abdomen. This information is not intended to replace advice given to you by your health care provider. Make sure you discuss any questions you have with your health care provider. Document Revised: 03/28/2019 Document Reviewed: 10/28/2018 Elsevier Patient Education  2021 [place] After This sheet gives you information about how to care for yourself after your procedure. Your health care provider may also give you more specific instructions. If you have problems or questions, contact your health care provider. What can I expect after the procedure? After the procedure, it is common to have:  Tiredness.  Forgetfulness about what happened after the procedure.  Impaired judgment for important decisions.  Nausea or vomiting.  Some difficulty with balance. Follow these instructions at home: For the time period you were told by your health care provider:  Rest as needed.  Do not participate in activities where you could fall or become injured.  Do not drive or use machinery.  Do not drink alcohol.  Do not take sleeping pills or medicines that cause drowsiness.  Do not make important decisions or sign legal documents.  Do not take care of  children on your own.      Eating and drinking  Follow the diet that is recommended by your health care provider.  Drink enough fluid to keep your urine pale yellow.  If you vomit: ? Drink water, juice, or soup when you can drink without vomiting. ? Make sure you have little or no nausea before eating solid foods. General instructions  Have a responsible adult stay with you for the time you are told. It is important to have someone help care for you until you are awake and alert.  Take over-the-counter and prescription medicines only as told by your health care provider.  If you have sleep apnea, surgery and certain medicines can increase your risk for breathing problems. Follow instructions from your health care provider about wearing your  sleep device: ? Anytime you are sleeping, including during daytime naps. ? While taking prescription pain medicines, sleeping medicines, or medicines that make you drowsy.  Avoid smoking.  Keep all follow-up visits as told by your health care provider. This is important. Contact a health care provider if:  You keep feeling nauseous or you keep vomiting.  You feel light-headed.  You are still sleepy or having trouble with balance after 24 hours.  You develop a rash.  You have a fever.  You have redness or swelling around the IV site. Get help right away if:  You have trouble breathing.  You have new-onset confusion at home. Summary  For several hours after your procedure, you may feel tired. You may also be forgetful and have poor judgment.  Have a responsible adult stay with you for the time you are told. It is important to have someone help care for you until you are awake and alert.  Rest as told. Do not drive or operate machinery. Do not drink alcohol or take sleeping pills.  Get help right away if you have trouble breathing, or if you suddenly become confused. This information is not intended to replace advice given to you  by your health care provider. Make sure you discuss any questions you have with your health care provider. Document Revised: 12/18/2019 Document Reviewed: 03/06/2019 Elsevier Patient Education  2021 [employer].

## 2020-06-25 ENCOUNTER — Encounter (HOSPITAL_COMMUNITY)
Admission: RE | Admit: 2020-06-25 | Discharge: 2020-06-25 | Disposition: A | Discharge status: Home / Self Care | Payer: PPO | Source: Ambulatory Visit | Attending: Internal Medicine | Admitting: Internal Medicine

## 2020-06-25 ENCOUNTER — Other Ambulatory Visit (HOSPITAL_COMMUNITY)
Admission: RE | Admit: 2020-06-25 | Discharge: 2020-06-25 | Disposition: A | Discharge status: Home / Self Care | Payer: PPO | Source: Ambulatory Visit | Attending: Internal Medicine | Admitting: Internal Medicine

## 2020-06-25 ENCOUNTER — Other Ambulatory Visit: Payer: Self-pay

## 2020-06-25 ENCOUNTER — Telehealth: Payer: Self-pay | Admitting: *Deleted

## 2020-06-25 DIAGNOSIS — Z20822 Contact with and (suspected) exposure to covid-19: Secondary | ICD-10-CM | POA: Diagnosis not present

## 2020-06-25 DIAGNOSIS — Z01812 Encounter for preprocedural laboratory examination: Secondary | ICD-10-CM | POA: Insufficient documentation

## 2020-06-25 NOTE — Progress Notes (Signed)
   06/25/20 1133  OBSTRUCTIVE SLEEP APNEA  Have you ever been diagnosed with sleep apnea through a sleep study? No  If yes, do you have and use a CPAP or BPAP machine every night? 0  Do you snore loudly (loud enough to be heard through closed doors)?  1  Do you often feel tired, fatigued, or sleepy during the daytime (such as falling asleep during driving or talking to someone)? 0  Has anyone observed you stop breathing during your sleep? 1  Do you have, or are you being treated for high blood pressure? 1  BMI more than 35 kg/m2? 0  Age > 50 (1-yes) 1  Neck circumference greater than:Female 16 inches or larger, Female 17inches or larger? 0  Female Gender (Yes=1) 0  Obstructive Sleep Apnea Score 4

## 2020-06-25 NOTE — Telephone Encounter (Signed)
Patient informed and verbalized understanding. Copy sent to PCP 

## 2020-06-25 NOTE — Telephone Encounter (Signed)
-----   Message from Verta Ellen., NP sent at 06/16/2020 10:49 PM EST ----- Please call the patient and let her know the stress test showed no evidence of lack of blood flow through the heart. It was considered a low risk study by the reading physician.

## 2020-06-25 NOTE — Telephone Encounter (Signed)
-----   Message from Verta Ellen., NP sent at 06/16/2020 10:55 PM EST ----- Thyroid labs look good. Hemoglobin A1c is 7%. Kidney function has improved from previous lab work. She continues with anemia with hemoglobin of 9.8 and hematocrit of 32.4. It appears she has chronic anemia based on previous lab work. Have her follow-up with PCP for her diabetes and anemia.

## 2020-06-26 LAB — SARS CORONAVIRUS 2 (TAT 6-24 HRS): SARS Coronavirus 2: NEGATIVE

## 2020-06-28 ENCOUNTER — Encounter (HOSPITAL_COMMUNITY)
Admission: RE | Disposition: A | Discharge status: Home / Self Care | Payer: Self-pay | Source: Home / Self Care | Attending: Internal Medicine

## 2020-06-28 ENCOUNTER — Ambulatory Visit (HOSPITAL_COMMUNITY): Payer: PPO | Admitting: Anesthesiology

## 2020-06-28 ENCOUNTER — Ambulatory Visit (HOSPITAL_COMMUNITY)
Admission: RE | Admit: 2020-06-28 | Discharge: 2020-06-28 | Disposition: A | Discharge status: Home / Self Care | Payer: PPO | Attending: Internal Medicine | Admitting: Internal Medicine

## 2020-06-28 DIAGNOSIS — L89329 Pressure ulcer of left buttock, unspecified stage: Secondary | ICD-10-CM | POA: Diagnosis not present

## 2020-06-28 DIAGNOSIS — Z885 Allergy status to narcotic agent status: Secondary | ICD-10-CM | POA: Diagnosis not present

## 2020-06-28 DIAGNOSIS — Z79899 Other long term (current) drug therapy: Secondary | ICD-10-CM | POA: Diagnosis not present

## 2020-06-28 DIAGNOSIS — Z7901 Long term (current) use of anticoagulants: Secondary | ICD-10-CM | POA: Insufficient documentation

## 2020-06-28 DIAGNOSIS — J441 Chronic obstructive pulmonary disease with (acute) exacerbation: Secondary | ICD-10-CM | POA: Diagnosis not present

## 2020-06-28 DIAGNOSIS — K5150 Left sided colitis without complications: Secondary | ICD-10-CM | POA: Diagnosis not present

## 2020-06-28 DIAGNOSIS — Z881 Allergy status to other antibiotic agents status: Secondary | ICD-10-CM | POA: Insufficient documentation

## 2020-06-28 DIAGNOSIS — Z87891 Personal history of nicotine dependence: Secondary | ICD-10-CM | POA: Insufficient documentation

## 2020-06-28 DIAGNOSIS — Z888 Allergy status to other drugs, medicaments and biological substances status: Secondary | ICD-10-CM | POA: Diagnosis not present

## 2020-06-28 DIAGNOSIS — K529 Noninfective gastroenteritis and colitis, unspecified: Secondary | ICD-10-CM

## 2020-06-28 DIAGNOSIS — K648 Other hemorrhoids: Secondary | ICD-10-CM | POA: Diagnosis not present

## 2020-06-28 DIAGNOSIS — Z880 Allergy status to penicillin: Secondary | ICD-10-CM | POA: Diagnosis not present

## 2020-06-28 DIAGNOSIS — K644 Residual hemorrhoidal skin tags: Secondary | ICD-10-CM | POA: Diagnosis not present

## 2020-06-28 HISTORY — PX: COLONOSCOPY WITH PROPOFOL: SHX5780

## 2020-06-28 HISTORY — PX: BIOPSY: SHX5522

## 2020-06-28 SURGERY — COLONOSCOPY WITH PROPOFOL
Anesthesia: General

## 2020-06-28 MED ORDER — GLYCOPYRROLATE PF 0.2 MG/ML IJ SOSY
PREFILLED_SYRINGE | INTRAMUSCULAR | Status: AC
  Filled 2020-06-28: qty 1

## 2020-06-28 MED ORDER — PROPOFOL 10 MG/ML IV BOLUS
INTRAVENOUS | Status: AC
  Filled 2020-06-28: qty 40

## 2020-06-28 MED ORDER — PROPOFOL 10 MG/ML IV BOLUS
INTRAVENOUS | Status: AC
  Filled 2020-06-28: qty 20

## 2020-06-28 MED ORDER — STERILE WATER FOR IRRIGATION IR SOLN
Status: DC | PRN
  Administered 2020-06-28: 1.5 mL

## 2020-06-28 MED ORDER — LIDOCAINE HCL (PF) 2 % IJ SOLN
INTRAMUSCULAR | Status: AC
  Filled 2020-06-28: qty 5

## 2020-06-28 MED ORDER — PROPOFOL 500 MG/50ML IV EMUL
INTRAVENOUS | Status: DC | PRN
  Administered 2020-06-28: 100 ug/kg/min via INTRAVENOUS

## 2020-06-28 MED ORDER — EPHEDRINE 5 MG/ML INJ
INTRAVENOUS | Status: AC
  Filled 2020-06-28: qty 10

## 2020-06-28 MED ORDER — LACTATED RINGERS IV SOLN: INTRAVENOUS | Status: DC

## 2020-06-28 MED ORDER — PHENYLEPHRINE 40 MCG/ML (10ML) SYRINGE FOR IV PUSH (FOR BLOOD PRESSURE SUPPORT)
PREFILLED_SYRINGE | INTRAVENOUS | Status: AC
  Filled 2020-06-28: qty 10

## 2020-06-28 NOTE — Anesthesia Postprocedure Evaluation (Signed)
Anesthesia Post Note  Patient: [name]  Procedure(s) Performed: COLONOSCOPY WITH PROPOFOL (N/A ) BIOPSY  Patient location during evaluation: Phase II Anesthesia Type: General Level of consciousness: awake and patient cooperative Pain management: pain level controlled Vital Signs Assessment: post-procedure vital signs reviewed and stable Respiratory status: spontaneous breathing, respiratory function stable and nonlabored ventilation Cardiovascular status: stable Postop Assessment: no apparent nausea or vomiting Anesthetic complications: no   No complications documented.   Last Vitals:  Vitals:   06/28/20 0635 06/28/20 0755  BP: (!) 156/73 (!) 116/55  Pulse: 73 75  Resp: 18 16  Temp: 36.6 C 36.6 C  SpO2: 99% 99%    Last Pain:  Vitals:   06/28/20 0755  TempSrc: Oral  PainSc: 0-No pain                 Reinhardt Licausi

## 2020-06-28 NOTE — Anesthesia Preprocedure Evaluation (Addendum)
Anesthesia Evaluation  Patient identified by MRN, date of birth, ID band Patient awake    Reviewed: Allergy & Precautions, NPO status , Patient's Chart, lab work & pertinent test results, reviewed documented beta blocker date and time   Airway Mallampati: II  TM Distance: >3 FB Neck ROM: Full    Dental  (+) Dental Advisory Given, Edentulous Upper, Edentulous Lower   Pulmonary shortness of breath and with exertion, sleep apnea , pneumonia, COPD, former smoker,    Pulmonary exam normal breath sounds clear to auscultation       Cardiovascular Exercise Tolerance: Poor hypertension (did not take her cardizem and metoprolol today), Pt. on medications and Pt. on home beta blockers + angina + CAD, + Past MI, + Cardiac Stents, + Peripheral Vascular Disease, +CHF and + DOE  Normal cardiovascular exam+ dysrhythmias Atrial Fibrillation  Rhythm:Regular Rate:Normal  Cath - 04/2018  Patent short left main  20 to 30% proximal LAD irregularity, with diffuse 20% narrowing within the mid vessel.  Patent proximal circumflex stent with mid to distal circumflex irregularities up to 30 to 40%.  Nondominant right coronary with ostial 90% narrowing.  No hemodynamics recorded.  Left ventriculography was not performed in an effort to decrease contrast exposure in the setting of CKD.  RECOMMENDATIONS:   Compared to prior angiography in 2017, no significant changes noted.  Chest pain is atypical and may be more musculoskeletal or pleuritic.  Resume Eliquis in 12 hours/a.m. of 05/17/2018     Neuro/Psych  Headaches, PSYCHIATRIC DISORDERS Anxiety Depression  Neuromuscular disease    GI/Hepatic GERD  Medicated and Controlled,  Endo/Other  Hypothyroidism   Renal/GU Renal InsufficiencyRenal disease     Musculoskeletal  (+) Arthritis  (lumbar fusion),   Abdominal   Peds  Hematology  (+) anemia ,   Anesthesia Other Findings    Reproductive/Obstetrics                             Anesthesia Physical Anesthesia Plan  ASA: III  Anesthesia Plan: General   Post-op Pain Management:    Induction: Intravenous  PONV Risk Score and Plan: Propofol infusion and TIVA  Airway Management Planned: Nasal Cannula and Natural Airway  Additional Equipment:   Intra-op Plan:   Post-operative Plan:   Informed Consent: I have reviewed the patients History and Physical, chart, labs and discussed the procedure including the risks, benefits and alternatives for the proposed anesthesia with the patient or authorized representative who has indicated his/her understanding and acceptance.       Plan Discussed with: CRNA and Surgeon  Anesthesia Plan Comments:        Anesthesia Quick Evaluation

## 2020-06-28 NOTE — Discharge Instructions (Signed)
PATIENT INSTRUCTIONS POST-ANESTHESIA  IMMEDIATELY FOLLOWING SURGERY:  Do not drive or operate machinery for the first twenty four hours after surgery.  Do not make any important decisions for twenty four hours after surgery or while taking narcotic pain medications or sedatives.  If you develop intractable nausea and vomiting or a severe headache please notify your doctor immediately.  FOLLOW-UP:  Please make an appointment with your surgeon as instructed. You do not need to follow up with anesthesia unless specifically instructed to do so.  WOUND CARE INSTRUCTIONS (if applicable):  Keep a dry clean dressing on the anesthesia/puncture wound site if there is drainage.  Once the wound has quit draining you may leave it open to air.  Generally you should leave the bandage intact for twenty four hours unless there is drainage.  If the epidural site drains for more than 36-48 hours please call the anesthesia department.  QUESTIONS?:  Please feel free to call your physician or the hospital operator if you have any questions, and they will be happy to assist you.       Colonoscopy Discharge Instructions  Read the instructions outlined below and refer to this sheet in the next few weeks. These discharge instructions provide you with general information on caring for yourself after you leave the hospital. Your doctor may also give you specific instructions. While your treatment has been planned according to the most current medical practices available, unavoidable complications occasionally occur. If you have any problems or questions after discharge, call Dr. Gala Romney at [phone]. ACTIVITY  You may resume your regular activity, but move at a slower pace for the next 24 hours.   Take frequent rest periods for the next 24 hours.   Walking will help get rid of the air and reduce the bloated feeling in your belly (abdomen).   No driving for 24 hours (because of the medicine (anesthesia) used during the  test).    Do not sign any important legal documents or operate any machinery for 24 hours (because of the anesthesia used during the test).  NUTRITION  Drink plenty of fluids.   You may resume your normal diet as instructed by your doctor.   Begin with a light meal and progress to your normal diet. Heavy or fried foods are harder to digest and may make you feel sick to your stomach (nauseated).   Avoid alcoholic beverages for 24 hours or as instructed.  MEDICATIONS  You may resume your normal medications unless your doctor tells you otherwise.  WHAT YOU CAN EXPECT TODAY  Some feelings of bloating in the abdomen.   Passage of more gas than usual.   Spotting of blood in your stool or on the toilet paper.  IF YOU HAD POLYPS REMOVED DURING THE COLONOSCOPY:  No aspirin products for 7 days or as instructed.   No alcohol for 7 days or as instructed.   Eat a soft diet for the next 24 hours.  FINDING OUT THE RESULTS OF YOUR TEST Not all test results are available during your visit. If your test results are not back during the visit, make an appointment with your caregiver to find out the results. Do not assume everything is normal if you have not heard from your caregiver or the medical facility. It is important for you to follow up on all of your test results.  SEEK IMMEDIATE MEDICAL ATTENTION IF:  You have more than a spotting of blood in your stool.   Your belly is swollen (  abdominal distention).   You are nauseated or vomiting.   You have a temperature over 101.   You have abdominal pain or discomfort that is severe or gets worse throughout the day.   Your colon appeared normal today.  You do have internal and external hemorrhoids  Sample's were taken to check for the cause of diarrhea  Office visit with Aliene Altes in 2 to 3 weeks  Continue to follow-up with primary care physician regarding left buttock pressure sore  Further recommendations to follow pending review  of pathology report  Resume Eliquis today\  At patient request, I called Amerie Beaumont at [phone] -reviewed results and recommendations

## 2020-06-28 NOTE — Op Note (Signed)
[facility] Patient Name: [name] Procedure Date: 06/28/2020 7:08 AM MRN: [id number] Date of Birth: February 21, 1933 Attending MD: Norvel Richards , MD CSN: [id number] Age: 87 Admit Type: Outpatient Procedure:                Colonoscopy Indications:              Chronic diarrhea Providers:                Norvel Richards, MD, Caprice Kluver, Randa Spike, Technician Referring MD:              Medicines:                Propofol per Anesthesia Complications:            No immediate complications. Estimated Blood Loss:     Estimated blood loss: none. Procedure:                Pre-Anesthesia Assessment:                           - Prior to the procedure, a History and Physical                            was performed, and patient medications and                            allergies were reviewed. The patient's tolerance of                            previous anesthesia was also reviewed. The risks                            and benefits of the procedure and the sedation                            options and risks were discussed with the patient.                            All questions were answered, and informed consent                            was obtained. Prior Anticoagulants: The patient                            last took Eliquis (apixaban) 3 days prior to the                            procedure. ASA Grade Assessment: III - A patient                            with severe systemic disease. After reviewing the  risks and benefits, the patient was deemed in                            satisfactory condition to undergo the procedure.                           After obtaining informed consent, the colonoscope                            was passed under direct vision. Throughout the                            procedure, the patient's blood pressure, pulse, and                            oxygen saturations were monitored  continuously. The                            [id number]-[id number] ([id number]) scope was introduced through                            the anus and advanced to the 5 cm into the ileum.                            The colonoscopy was performed without difficulty.                            The patient tolerated the procedure well. The                            quality of the bowel preparation was adequate. Scope In: 7:36:00 AM Scope Out: 7:50:18 AM Scope Withdrawal Time: 0 hours 8 minutes 47 seconds  Total Procedure Duration: 0 hours 14 minutes 18 seconds  Findings:      External hemorrhoids present. 5 x 6 cm pressure sore left buttock.      Normal-appearing terminal ileum (5 cm)      The colon (entire examined portion) appeared normal. Internal       hemorrhoids on retroflexion.      No additional abnormalities were found on retroflexion. Biopsies of the       right and left colon taken for histologic study. Impression:               - The entire examined colon is normal. Internal and                            external hemorrhoids. Left buttock pressure sore.                            Terminal ileum. Status post segmental biopsy                           - No specimens collected. Moderate Sedation:      Moderate (conscious) sedation was personally administered by an       anesthesia professional. The following parameters were  monitored: oxygen       saturation, heart rate, blood pressure, respiratory rate, EKG, adequacy       of pulmonary ventilation, and response to care. Recommendation:           - Patient has a contact number available for                            emergencies. The signs and symptoms of potential                            delayed complications were discussed with the                            patient. Return to normal activities tomorrow.                            Written discharge instructions were provided to the                            patient.                            - Advance diet as tolerated. Follow-up on                            pathology. Resume Eliquis today. Office visit with                            [place] in 2 weeks. Follow-up care of buttock decubitus                            per PCP. Procedure Code(s):        --- Professional ---                           [phone], Colonoscopy, flexible; diagnostic, including                            collection of specimen(s) by brushing or washing,                            when performed (separate procedure) Diagnosis Code(s):        --- Professional ---                           K52.9, Noninfective gastroenteritis and colitis,                            unspecified CPT copyright 2019 [employer]. All rights reserved. The codes documented in this report are preliminary and upon coder review may  be revised to meet current compliance requirements. Cristopher Estimable. Woodie Trusty, MD Norvel Richards, MD 06/28/2020 8:48:14 AM This report has been signed electronically. Number of Addenda: 0

## 2020-06-28 NOTE — Transfer of Care (Signed)
Immediate Anesthesia Transfer of Care Note  Patient: [name]  Procedure(s) Performed: COLONOSCOPY WITH PROPOFOL (N/A ) BIOPSY  Patient Location: PACU  Anesthesia Type:General  Level of Consciousness: awake, alert  and patient cooperative  Airway & Oxygen Therapy: Patient Spontanous Breathing  Post-op Assessment: Report given to RN, Post -op Vital signs reviewed and stable and Patient moving all extremities X 4  Post vital signs: Reviewed and stable  Last Vitals:  Vitals Value Taken Time  BP 116/55 06/28/20 0755  Temp 36.6 C 06/28/20 0755  Pulse 75 06/28/20 0755  Resp 16 06/28/20 0755  SpO2 99 % 06/28/20 0755    Last Pain:  Vitals:   06/28/20 0755  TempSrc: Oral  PainSc: 0-No pain      Patients Stated Pain Goal: 7 (18/59/09 3112)  Complications: No complications documented.

## 2020-06-28 NOTE — H&P (Signed)
@LOGO @   Primary Care Physician:  Rosalee Kaufman, PA-C Primary Gastroenterologist:  Dr. Gala Romney  Pre-Procedure History & Physical: HPI:  [name] is a 87 y.o. female here for diagnostic colonoscopy.  Intermittent rectal bleeding and diarrhea.  Stopped Eliquis on March 11.  Past Medical History:  Diagnosis Date  . Anxiety   . Arthritis   . Atrial fibrillation ([place])   . Bursitis    Left shoulder  . Cataract   . CHF (congestive heart failure) ([place])   . CKD (chronic kidney disease)    stage 3-4  . COPD (chronic obstructive pulmonary disease) ([place])   . Coronary atherosclerosis of native coronary artery    a. s/p DES to LCx in 04/2013 b. cath in 11/2015 showing patent stent with 20% prox-LAD and 80% ostial RCA stenosis for which medical management was recommended due to small artery size  . Depression   . Diastolic heart failure (HCC)    EF 55-60%  . Dysphagia, unspecified(787.20)   . Dyspnea   . Dysrhythmia   . Essential hypertension   . GERD (gastroesophageal reflux disease)    Hx Schatzki's ring, multiple EGD/ED last 01/06/2004  . Gout   . Headache   . History of anemia   . Hyperlipidemia   . Internal hemorrhoids without mention of complication   . MI (myocardial infarction) ([place]) 2006  . Microscopic colitis 2003  . Panic disorder without agoraphobia   . Paresthesia   . Pneumonia 12/2011  . PVD (peripheral vascular disease) ([place])   . S/P colonoscopy 09/27/2001   internal hemorrhoids, desc colon inflam polyp, SB BX-chronic duodenitis, colitis  . Sleep apnea   . Thyroid disease     Past Surgical History:  Procedure Laterality Date  . ABDOMINAL HYSTERECTOMY    . ABDOMINAL HYSTERECTOMY    . ANTERIOR AND POSTERIOR REPAIR     with resection of vagina  . ANTERIOR LAT LUMBAR FUSION N/A 08/01/2016   Procedure: Lumbar Two-Lumbar Five Transpsoas lateral interbody fusion with Lumbar Two-Three lateral plate fixation;  Surgeon: Kevan Ny Ditty, MD;  Location: [facility];  Service: Neurosurgery;  Laterality: N/A;  L2-5 Transpsoas lateral interbody fusion with L2-3 lateral plate fixation  . APPENDECTOMY    . BACK SURGERY    . BIOPSY  07/05/2015   Procedure: BIOPSY;  Surgeon: Daneil Dolin, MD;  [facility];  Service: Endoscopy;;  gastric polyp biopsy, ascending colon biopsy  . BIOPSY  02/16/2020   Procedure: BIOPSY;  Surgeon: Daneil Dolin, MD;  [facility];  Service: Endoscopy;;  . BLADDER SUSPENSION  11/09/2011   Procedure: TRANSVAGINAL TAPE (TVT) PROCEDURE;  Surgeon: Marissa Nestle, MD;  [facility];  Service: Urology;  Laterality: N/A;  . bladder tack  06/2010  . BREAST LUMPECTOMY  1998   left, benign  . CARDIAC CATHETERIZATION    . CARDIAC CATHETERIZATION    . CARDIAC CATHETERIZATION N/A 12/16/2015   Procedure: Left Heart Cath and Coronary Angiography;  Surgeon: Troy Sine, MD;  Location: [facility];  Service: Cardiovascular;  Laterality: N/A;  . CARDIOVERSION N/A 10/04/2017   Procedure: CARDIOVERSION;  Surgeon: Herminio Commons, MD;  [facility];  Service: Cardiovascular;  Laterality: N/A;  . CARDIOVERSION N/A 01/30/2018   Procedure: CARDIOVERSION;  Surgeon: Herminio Commons, MD;  [facility];  Service: Cardiovascular;  Laterality: N/A;  . CARDIOVERSION N/A 11/10/2019   Procedure: CARDIOVERSION;  Surgeon: Josue Hector, MD;  Location:  [facility];  Service: Cardiovascular;  Laterality: N/A;  . [place]   left  . cataract surgery    . CHOLECYSTECTOMY  1998  . Cholecystectomy    . COLONOSCOPY  03/16/2011   multiple hyperplastic colon polyps, sigmoid diverticulosis, melanosis coli  . COLONOSCOPY WITH PROPOFOL N/A 07/05/2015   RMR:one 5 mm polyp in descending colon  . CORONARY ANGIOGRAPHY N/A 05/16/2018   Procedure: CORONARY ANGIOGRAPHY (CATH LAB);  Surgeon: Belva Crome, MD;  Location: [facility];  Service: Cardiovascular;  Laterality: N/A;  . CORONARY  ANGIOPLASTY WITH STENT PLACEMENT  2015  . ESOPHAGEAL DILATION N/A 07/05/2015   Procedure: ESOPHAGEAL DILATION;  Surgeon: Daneil Dolin, MD;  [facility];  Service: Endoscopy;  Laterality: N/A;  . ESOPHAGOGASTRODUODENOSCOPY (EGD) WITH PROPOFOL N/A 07/05/2015   HER:DEYCXK  . ESOPHAGOGASTRODUODENOSCOPY (EGD) WITH PROPOFOL N/A 02/16/2020   Procedure: ESOPHAGOGASTRODUODENOSCOPY (EGD) WITH PROPOFOL;  Surgeon: Daneil Dolin, MD;  Normal examined esophagus s/p dilation, erythematous mucosa in the stomach s/p biopsy, normal examined duodenum. Pathology with mild gastropathy, negative for H. Pylori.   [name] Kitchen JOINT REPLACEMENT Right 2007   right knee  . left hand surgery    . LEFT HEART CATHETERIZATION WITH CORONARY ANGIOGRAM N/A 05/14/2013   Procedure: LEFT HEART CATHETERIZATION WITH CORONARY ANGIOGRAM;  Surgeon: Blane Ohara, MD;  Location: [facility] C[facility];  Service: Cardiovascular;  Laterality: N/A;  . left rotator cuff surgery    . LUMBAR LAMINECTOMY/DECOMPRESSION MICRODISCECTOMY N/A 10/11/2012   Procedure: LUMBAR LAMINECTOMY/DECOMPRESSION MICRODISCECTOMY 2 LEVELS;  Surgeon: Floyce Stakes, MD;  Location: [facility];  Service: Neurosurgery;  Laterality: N/A;  L3-4 L4-5 Laminectomy  . LUMBAR WOUND DEBRIDEMENT N/A 09/27/2015   Procedure: Exploration of Lumbar Wound w/ Repair CSF Leak/Lumbar Drain Placement;  Surgeon: Leeroy Cha, MD;  Location: [place] [facility];  Service: Neurosurgery;  Laterality: N/A;  . MALONEY DILATION  03/16/2011   Gastritis. No H.pylori on bx. 51F maloney dilation with disruption of  occult cevical esophageal web  . MALONEY DILATION N/A 02/16/2020   Procedure: Venia Minks DILATION;  Surgeon: Daneil Dolin, MD;  [facility];  Service: Endoscopy;  Laterality: N/A;  . NASAL SINUS SURGERY    . right knee replacement  2007  . right leg benign tumor    . SHOULDER SURGERY Left   . TEE WITHOUT CARDIOVERSION N/A 10/04/2017   Procedure: TRANSESOPHAGEAL ECHOCARDIOGRAM  (TEE) WITH PROPOFOL;  Surgeon: Herminio Commons, MD;  [facility];  Service: Cardiovascular;  Laterality: N/A;  . TEE WITHOUT CARDIOVERSION N/A 11/10/2019   Procedure: TRANSESOPHAGEAL ECHOCARDIOGRAM (TEE);  Surgeon: Josue Hector, MD;  Location[facility] ENDOSCOPY;  Service: Cardiovascular;  Laterality: N/A;  . TONSILLECTOMY    . unspecified area, hysterectomy  1972   partial    Prior to Admission medications   Medication Sig Start Date End Date Taking? Authorizing Provider  acetaminophen (TYLENOL) 500 MG tablet Take 500 mg by mouth every 6 (six) hours as needed for headache or moderate pain.   Yes [provider]  albuterol (PROVENTIL) (2.5 MG/3ML) 0.083% nebulizer solution Take 2.5 mg by nebulization 2 (two) times daily.   Yes [provider]  ALPRAZolam Duanne Moron) 0.5 MG tablet Take 0.5 mg by mouth daily as needed for anxiety or sleep. 09/29/19  Yes [provider]  apixaban (ELIQUIS) 5 MG TABS tablet Take 1 tablet (5 mg total) by mouth 2 (two) times daily. 07/14/19  Yes Evans Lance, MD  bismuth subsalicylate (STOMACH RELIEF) 262 MG/15ML suspension Take 8 mLs by mouth every 4 (four) hours as needed for diarrhea or loose stools.   Yes [provider]  diltiazem (CARDIZEM CD) 120 MG 24 hr capsule TAKE ONE CAPSULE BY MOUTH DAILY Patient taking differently: Take 120 mg by mouth daily. 05/21/20  Yes Verta Ellen., NP  DULoxetine (CYMBALTA) 60 MG capsule Take 60 mg by mouth at bedtime.   Yes [provider]  fluticasone (CUTIVATE) 0.05 % cream Apply 1 application topically 2 (two) times daily as needed (skin tears). 07/15/19  Yes [provider]  furosemide (LASIX) 40 MG tablet Take 1 tablet (40 mg total) by mouth daily. Patient taking differently: Take 20 mg by mouth daily. Additional 20 mg if gain 4 lbs daily 09/24/19  Yes Tat, David, MD  hydrocortisone (ANUSOL-HC) 2.5 % rectal cream Place 1 application rectally 2 (two) times  daily. Patient taking differently: Place 1 application rectally 3 (three) times a week. 12/12/19  Yes Erenest Rasher, PA-C  isosorbide mononitrate (IMDUR) 60 MG 24 hr tablet Take 60 mg by mouth daily.   Yes [provider]  loperamide (IMODIUM A-D) 2 MG tablet Take 4 mg by mouth 2 (two) times daily as needed for diarrhea or loose stools.   Yes [provider]  magnesium oxide (MAG-OX) 400 MG tablet TAKE ONE TABLET BY MOUTH DAILY. Patient taking differently: Take 400 mg by mouth daily. 10/21/19  Yes Verta Ellen., NP  Melatonin 10 MG TABS Take 10 mg by mouth daily as needed (Sleep).   Yes [provider]  metoprolol succinate (TOPROL-XL) 25 MG 24 hr tablet Take 75 mg by mouth daily.   Yes [provider]  Multiple Vitamins-Minerals (HAIR/SKIN/NAILS) CAPS Take 1 tablet by mouth daily.   Yes [provider]  Naphazoline HCl (CLEAR EYES OP) Place 1 drop into both eyes daily as needed (itching).   Yes [provider]  nitroGLYCERIN (NITROSTAT) 0.4 MG SL tablet Place 1 tablet (0.4 mg total) under the tongue every 5 (five) minutes as needed for chest pain. Reported on 08/04/2015 11/20/17  Yes Herminio Commons, MD  pantoprazole (PROTONIX) 40 MG tablet Take 1 tablet (40 mg total) by mouth 2 (two) times daily before a meal. 12/12/19  Yes Aliene Altes S, PA-C  potassium chloride (KLOR-CON) 10 MEQ tablet Take 1 tablet (10 mEq total) by mouth daily. 04/06/20  Yes BranchAlphonse Guild, MD  colestipol (COLESTID) 1 g tablet Take 2 tablets (2 g total) by mouth daily. Administer other medication 1 hour before or 4 hours after colestipol. Patient not taking: Reported on 06/23/2020 04/02/20   Erenest Rasher, PA-C    Allergies as of 05/12/2020 - Review Complete 05/06/2020  Allergen Reaction Noted  . Cephalosporins Diarrhea and Nausea Only 08/04/2015  . Levaquin [levofloxacin in d5w] Swelling 07/06/2014  . Macrodantin [nitrofurantoin macrocrystal] Swelling  07/06/2014  . Phenothiazines Anaphylaxis and Hives 08/16/2011  . Polysorbate Anaphylaxis 08/16/2011  . Prednisone Shortness Of Breath   . Buspirone Itching 07/06/2014  . Cardura [doxazosin mesylate] Itching 07/06/2014  . Codeine Itching   . Acyclovir and related Itching 10/07/2015  . Colcrys [colchicine] Nausea Only 10/04/2018  . Prochlorperazine Other (See Comments) 08/16/2011  . Ranexa [ranolazine]  04/06/2015  . Atorvastatin Hives 08/16/2011  . Colestipol Palpitations 05/03/2020  . Ofloxacin Rash   . Other Itching and Rash 10/10/2012  . Penicillins Other (See Comments) 11/09/2011  . Pimozide Hives and Itching 08/16/2011  Family History  Problem Relation Age of Onset  . Stroke Mother   . Parkinson's disease Father   . Coronary artery disease Other        family Hx-sons  . Cancer Other   . Stroke Other        family Hx  . Hypertension Other        family Hx  . Diabetes Brother   . Heart disease Son        before age 69  . Diabetes Son   . Stroke Daughter 83  . Colon cancer Grandson        diagnosed 40  . Inflammatory bowel disease Neg Hx     Social History   Socioeconomic History  . Marital status: Divorced    Spouse name: Not on file  . Number of children: 5  . Years of education: Not on file  . Highest education level: Not on file  Occupational History  . Occupation: retired  Tobacco Use  . Smoking status: Former Smoker    Packs/day: 1.00    Years: 64.00    Pack years: 64.00    Types: Cigarettes    Start date: 12/24/1947    Quit date: 11/17/2001    Years since quitting: 18.6  . Smokeless tobacco: Never Used  . Tobacco comment: Quit smoking in 2003  Vaping Use  . Vaping Use: Never used  Substance and Sexual Activity  . Alcohol use: Not Currently    Alcohol/week: 0.0 standard drinks    Comment: only at new years.   . Drug use: No  . Sexual activity: Never  Other Topics Concern  . Not on file  Social History Narrative   Divorced.   Sister had  colon perforation & died from complications in [place], [place]   Social Determinants of Health   Financial Resource Strain: Not on file  Food Insecurity: Not on file  Transportation Needs: Not on file  Physical Activity: Not on file  Stress: Not on file  Social Connections: Not on file  Intimate Partner Violence: Not on file    Review of Systems: See HPI, otherwise negative ROS  Physical Exam: BP (!) 156/73   Pulse 73   Temp 97.8 F (36.6 C) (Oral)   Resp 18   SpO2 99%  General:   Frail elderly pleasant and cooperative in NAD Skin:  Intact without significant lesions or rashes. Lungs:  Clear throughout to auscultation.   No wheezes, crackles, or rhonchi. No acute distress. Heart:  Regular rate and rhythm; no murmurs, clicks, rubs,  or gallops. Abdomen: Non-distended, normal bowel sounds.  Soft and nontender without appreciable mass or hepatosplenomegaly.  Pulses:  Normal pulses noted. Extremities:  Without clubbing or edema.  Impression/Plan: 87 year old lady here for further evaluation of chronic diarrhea and paper hematochezia.  Chronically anticoagulated.  I have offered the patient a diagnostic colonoscopy today. The risks, benefits, limitations, alternatives and imponderables have been reviewed with the patient. Questions have been answered. All parties are agreeable.

## 2020-06-29 ENCOUNTER — Other Ambulatory Visit: Payer: Self-pay | Admitting: Gastroenterology

## 2020-06-29 DIAGNOSIS — K219 Gastro-esophageal reflux disease without esophagitis: Secondary | ICD-10-CM

## 2020-06-29 DIAGNOSIS — J449 Chronic obstructive pulmonary disease, unspecified: Secondary | ICD-10-CM | POA: Diagnosis not present

## 2020-06-29 LAB — SURGICAL PATHOLOGY

## 2020-06-30 ENCOUNTER — Other Ambulatory Visit: Payer: Self-pay

## 2020-06-30 MED ORDER — BUDESONIDE 3 MG PO CPEP: 9.0000 mg | ORAL_CAPSULE | Freq: Every day | ORAL | 1 refills | Status: DC

## 2020-07-05 ENCOUNTER — Encounter (HOSPITAL_COMMUNITY): Payer: Self-pay | Admitting: Internal Medicine

## 2020-07-14 DIAGNOSIS — Z87891 Personal history of nicotine dependence: Secondary | ICD-10-CM | POA: Diagnosis not present

## 2020-07-14 DIAGNOSIS — I5033 Acute on chronic diastolic (congestive) heart failure: Secondary | ICD-10-CM | POA: Diagnosis not present

## 2020-07-14 DIAGNOSIS — I130 Hypertensive heart and chronic kidney disease with heart failure and stage 1 through stage 4 chronic kidney disease, or unspecified chronic kidney disease: Secondary | ICD-10-CM | POA: Diagnosis not present

## 2020-07-14 DIAGNOSIS — R55 Syncope and collapse: Secondary | ICD-10-CM | POA: Diagnosis not present

## 2020-07-14 DIAGNOSIS — N1832 Chronic kidney disease, stage 3b: Secondary | ICD-10-CM | POA: Diagnosis not present

## 2020-07-14 DIAGNOSIS — J440 Chronic obstructive pulmonary disease with acute lower respiratory infection: Secondary | ICD-10-CM | POA: Diagnosis not present

## 2020-07-28 DIAGNOSIS — M109 Gout, unspecified: Secondary | ICD-10-CM | POA: Diagnosis not present

## 2020-07-28 DIAGNOSIS — N289 Disorder of kidney and ureter, unspecified: Secondary | ICD-10-CM | POA: Diagnosis not present

## 2020-07-28 DIAGNOSIS — I10 Essential (primary) hypertension: Secondary | ICD-10-CM | POA: Diagnosis not present

## 2020-07-28 DIAGNOSIS — I482 Chronic atrial fibrillation, unspecified: Secondary | ICD-10-CM | POA: Diagnosis not present

## 2020-07-28 DIAGNOSIS — J440 Chronic obstructive pulmonary disease with acute lower respiratory infection: Secondary | ICD-10-CM | POA: Diagnosis not present

## 2020-07-28 DIAGNOSIS — L8990 Pressure ulcer of unspecified site, unspecified stage: Secondary | ICD-10-CM | POA: Diagnosis not present

## 2020-07-28 DIAGNOSIS — R0602 Shortness of breath: Secondary | ICD-10-CM | POA: Diagnosis not present

## 2020-07-28 DIAGNOSIS — R197 Diarrhea, unspecified: Secondary | ICD-10-CM | POA: Diagnosis not present

## 2020-07-30 ENCOUNTER — Ambulatory Visit: Payer: PPO | Admitting: Cardiology

## 2020-07-30 ENCOUNTER — Other Ambulatory Visit: Payer: Self-pay

## 2020-07-30 ENCOUNTER — Encounter: Payer: Self-pay | Admitting: Cardiology

## 2020-07-30 VITALS — BP 157/73 | HR 66 | Ht 61.0 in | Wt 161.0 lb

## 2020-07-30 DIAGNOSIS — I2510 Atherosclerotic heart disease of native coronary artery without angina pectoris: Secondary | ICD-10-CM

## 2020-07-30 DIAGNOSIS — J449 Chronic obstructive pulmonary disease, unspecified: Secondary | ICD-10-CM | POA: Diagnosis not present

## 2020-07-30 DIAGNOSIS — I472 Ventricular tachycardia: Secondary | ICD-10-CM

## 2020-07-30 DIAGNOSIS — I5032 Chronic diastolic (congestive) heart failure: Secondary | ICD-10-CM

## 2020-07-30 DIAGNOSIS — I4729 Other ventricular tachycardia: Secondary | ICD-10-CM

## 2020-07-30 MED ORDER — FUROSEMIDE 40 MG PO TABS: ORAL_TABLET | ORAL | 1 refills | Status: DC

## 2020-07-30 NOTE — Progress Notes (Signed)
Clinical Summary [name] is a 87 y.o.female seen today for follow up of the following medical problems.   1. CAD - prior DES to LCX in 2015 - repeat caths 2017 and 2020 without obstructive disease other than ostial disease of a small RCA with recs for medical management - 10/2019 nuclear stress: no ischemia   - during last appt with PA Leonides Sake reported exertional chest pain  - 06/2020 nuclear stress: no ischemia  -still with epigastric pain. Sharp pain, can be associated with eatign.    2. NSVT - on toprol, has been followed by EP Dr Lovena Le   - no recent palpitations   3. Chronic diastolic HF - no edema, weights are stable  - some increased SOB.  - 05/2020 echo LVEF 60-65%, no WMAs, indet diastolic fxn,  - home weight trending up - takes lasix 20mg  daily, takes additional 40mg  when weight gain - some orthopnea which is progressing   - at 155 lbs  4. Persistent afib - Recent TEE/DCCV performed 11/10/2019 with 3 shocks resulting in normal sinus rhythm - from notes does better breathing wise in SR   5. Syncope - recent monitor with rare SVT and NSVT, 2% afib burden. No arrhythmias consistent with cause of syncope  - 3 falls recently - she does not recall the details - last episode in February   5. CKD III   6. Melena/microcytic anemia - followed by GI - recent admit to [facility] 01/2020, Hgb 6.7 - some ongoing bleeding intermittently   7. Dysphagia - followed by GI   8. SOB - no recent LE edema - mainly with activities - some wehezing at times, some cough. Not productive - uses nebulizer twice a day.   9 COPD -  Past Medical History:  Diagnosis Date   Anxiety    Arthritis    Atrial fibrillation (HCC)    Bursitis    Left shoulder   Cataract    CHF (congestive heart failure) (HCC)    CKD (chronic kidney disease)    stage 3-4   COPD (chronic obstructive pulmonary disease) (HCC)    Coronary atherosclerosis of native coronary artery    a.  s/p DES to LCx in 04/2013 b. cath in 11/2015 showing patent stent with 20% prox-LAD and 80% ostial RCA stenosis for which medical management was recommended due to small artery size   Depression    Diastolic heart failure (HCC)    EF 55-60%   Dysphagia, unspecified(787.20)    Dyspnea    Dysrhythmia    Essential hypertension    GERD (gastroesophageal reflux disease)    Hx Schatzki's ring, multiple EGD/ED last 01/06/2004   Gout    Headache    History of anemia    Hyperlipidemia    Internal hemorrhoids without mention of complication    MI (myocardial infarction) ([place]) 2006   Microscopic colitis 2003   Panic disorder without agoraphobia    Paresthesia    Pneumonia 12/2011   PVD (peripheral vascular disease) (HCC)    S/P colonoscopy 09/27/2001   internal hemorrhoids, desc colon inflam polyp, SB BX-chronic duodenitis, colitis   Sleep apnea    Thyroid disease      Allergies  Allergen Reactions   Cephalosporins Diarrhea and Nausea Only    Lightheaded   Levaquin [Levofloxacin In D5w] Swelling   Macrodantin [Nitrofurantoin Macrocrystal] Swelling   Phenothiazines Anaphylaxis and Hives   Polysorbate Anaphylaxis   Prednisone Shortness Of Breath   Buspirone Itching  Cardura [Doxazosin Mesylate] Itching   Codeine Itching   Acyclovir And Related Itching    Redness of skin   Colcrys [Colchicine] Nausea Only    Upset stomach    Prochlorperazine Other (See Comments)    "Upset stomach"   Ranexa [Ranolazine]     Severe drop in BP   Atorvastatin Hives    Cramping; tolerates Crestor ok   Colestipol Palpitations   Ofloxacin Rash   Other Itching and Rash    "WOOL"= make skin look like it has been burned   Penicillins Other (See Comments)    Causes redness all over. Has patient had a PCN reaction causing immediate rash, facial/tongue/throat swelling, SOB or lightheadedness with hypotension: No Has patient had a PCN reaction causing severe rash involving mucus membranes or skin necrosis:  No Has patient had a PCN reaction that required hospitalization No Has patient had a PCN reaction occurring within the last 10 years: No If all of the above answers are "NO", then may proceed with Cephalosporin use.    Pimozide Hives and Itching     Current Outpatient Medications  Medication Sig Dispense Refill   acetaminophen (TYLENOL) 500 MG tablet Take 500 mg by mouth every 6 (six) hours as needed for headache or moderate pain.     albuterol (PROVENTIL) (2.5 MG/3ML) 0.083% nebulizer solution Take 2.5 mg by nebulization 2 (two) times daily.     ALPRAZolam (XANAX) 0.5 MG tablet Take 0.5 mg by mouth daily as needed for anxiety or sleep.     apixaban (ELIQUIS) 5 MG TABS tablet Take 1 tablet (5 mg total) by mouth 2 (two) times daily. 180 tablet 3   bismuth subsalicylate (STOMACH RELIEF) 262 MG/15ML suspension Take 8 mLs by mouth every 4 (four) hours as needed for diarrhea or loose stools.     budesonide (ENTOCORT EC) 3 MG 24 hr capsule Take 3 capsules (9 mg total) by mouth daily. 90 capsule 1   diltiazem (CARDIZEM CD) 120 MG 24 hr capsule TAKE ONE CAPSULE BY MOUTH DAILY (Patient taking differently: Take 120 mg by mouth daily.) 90 capsule 1   DULoxetine (CYMBALTA) 60 MG capsule Take 60 mg by mouth at bedtime.     fluticasone (CUTIVATE) 0.05 % cream Apply 1 application topically 2 (two) times daily as needed (skin tears).     furosemide (LASIX) 40 MG tablet Take 1 tablet (40 mg total) by mouth daily. (Patient taking differently: Take 20 mg by mouth daily. Additional 20 mg if gain 4 lbs daily) 30 tablet 1   hydrocortisone (ANUSOL-HC) 2.5 % rectal cream Place 1 application rectally 2 (two) times daily. (Patient taking differently: Place 1 application rectally 3 (three) times a week.) 30 g 1   isosorbide mononitrate (IMDUR) 60 MG 24 hr tablet Take 60 mg by mouth daily.     loperamide (IMODIUM A-D) 2 MG tablet Take 4 mg by mouth 2 (two) times daily as needed for diarrhea or loose stools.      magnesium oxide (MAG-OX) 400 MG tablet TAKE ONE TABLET BY MOUTH DAILY. (Patient taking differently: Take 400 mg by mouth daily.) 90 tablet 3   Melatonin 10 MG TABS Take 10 mg by mouth daily as needed (Sleep).     metoprolol succinate (TOPROL-XL) 25 MG 24 hr tablet Take 75 mg by mouth daily.     Multiple Vitamins-Minerals (HAIR/SKIN/NAILS) CAPS Take 1 tablet by mouth daily.     Naphazoline HCl (CLEAR EYES OP) Place 1 drop into both eyes daily as  needed (itching).     nitroGLYCERIN (NITROSTAT) 0.4 MG SL tablet Place 1 tablet (0.4 mg total) under the tongue every 5 (five) minutes as needed for chest pain. Reported on 08/04/2015 25 tablet 6   pantoprazole (PROTONIX) 40 MG tablet TAKE ONE TABLET BY MOUTH TWICE DAILY BEFORE MEALS 60 tablet 3   potassium chloride (KLOR-CON) 10 MEQ tablet Take 1 tablet (10 mEq total) by mouth daily. 180 tablet 1   No current facility-administered medications for this visit.     Past Surgical History:  Procedure Laterality Date   ABDOMINAL HYSTERECTOMY     ABDOMINAL HYSTERECTOMY     ANTERIOR AND POSTERIOR REPAIR     with resection of vagina   ANTERIOR LAT LUMBAR FUSION N/A 08/01/2016   Procedure: Lumbar Two-Lumbar Five Transpsoas lateral interbody fusion with Lumbar Two-Three lateral plate fixation;  Surgeon: Kevan Ny Ditty, MD;  Location: [facility];  Service: Neurosurgery;  Laterality: N/A;  L2-5 Transpsoas lateral interbody fusion with L2-3 lateral plate fixation   APPENDECTOMY     BACK SURGERY     BIOPSY  07/05/2015   Procedure: BIOPSY;  Surgeon: Daneil Dolin, MD;  [facility];  Service: Endoscopy;;  gastric polyp biopsy, ascending colon biopsy   BIOPSY  02/16/2020   Procedure: BIOPSY;  Surgeon: Daneil Dolin, MD;  [facility];  Service: Endoscopy;;   BIOPSY  06/28/2020   Procedure: BIOPSY;  Surgeon: Daneil Dolin, MD;  [facility];  Service: Endoscopy;;   BLADDER SUSPENSION  11/09/2011   Procedure: TRANSVAGINAL TAPE  (TVT) PROCEDURE;  Surgeon: Marissa Nestle, MD;  Location: [facility];  Service: Urology;  Laterality: N/A;   bladder tack  06/2010   BREAST LUMPECTOMY  1998   left, benign   CARDIAC CATHETERIZATION     CARDIAC CATHETERIZATION     CARDIAC CATHETERIZATION N/A 12/16/2015   Procedure: Left Heart Cath and Coronary Angiography;  Surgeon: Troy Sine, MD;  Location: [facility];  Service: Cardiovascular;  Laterality: N/A;   CARDIOVERSION N/A 10/04/2017   Procedure: CARDIOVERSION;  Surgeon: Herminio Commons, MD;  Location: [facility];  Service: Cardiovascular;  Laterality: N/A;   CARDIOVERSION N/A 01/30/2018   Procedure: CARDIOVERSION;  Surgeon: Herminio Commons, MD;  [facility];  Service: Cardiovascular;  Laterality: N/A;   CARDIOVERSION N/A 11/10/2019   Procedure: CARDIOVERSION;  Surgeon: Josue Hector, MD;  Location: [facility];  Service: Cardiovascular;  Laterality: N/A;   [place]   left   cataract surgery     CHOLECYSTECTOMY  1998   Cholecystectomy     COLONOSCOPY  03/16/2011   multiple hyperplastic colon polyps, sigmoid diverticulosis, melanosis coli   COLONOSCOPY WITH PROPOFOL N/A 07/05/2015   RMR:one 5 mm polyp in descending colon   COLONOSCOPY WITH PROPOFOL N/A 06/28/2020   Procedure: COLONOSCOPY WITH PROPOFOL;  Surgeon: Daneil Dolin, MD;  [facility];  Service: Endoscopy;  Laterality: N/A;  am appt   CORONARY ANGIOGRAPHY N/A 05/16/2018   Procedure: CORONARY ANGIOGRAPHY (CATH LAB);  Surgeon: Belva Crome, MD;  Location: [facility];  Service: Cardiovascular;  Laterality: N/A;   CORONARY ANGIOPLASTY WITH STENT PLACEMENT  2015   ESOPHAGEAL DILATION N/A 07/05/2015   Procedure: ESOPHAGEAL DILATION;  Surgeon: Daneil Dolin, MD;  [facility];  Service: Endoscopy;  Laterality: N/A;   ESOPHAGOGASTRODUODENOSCOPY (EGD) WITH PROPOFOL N/A 07/05/2015   [id number]   ESOPHAGOGASTRODUODENOSCOPY (EGD) WITH  PROPOFOL N/A  02/16/2020   Procedure: ESOPHAGOGASTRODUODENOSCOPY (EGD) WITH PROPOFOL;  Surgeon: Daneil Dolin, MD;  Normal examined esophagus s/p dilation, erythematous mucosa in the stomach s/p biopsy, normal examined duodenum. Pathology with mild gastropathy, negative for H. Pylori.    JOINT REPLACEMENT Right 2007   right knee   left hand surgery     LEFT HEART CATHETERIZATION WITH CORONARY ANGIOGRAM N/A 05/14/2013   Procedure: LEFT HEART CATHETERIZATION WITH CORONARY ANGIOGRAM;  Surgeon: Blane Ohara, MD;  Location: [facility] CATH LAB;  Service: Cardiovascular;  Laterality: N/A;   left rotator cuff surgery     LUMBAR LAMINECTOMY/DECOMPRESSION MICRODISCECTOMY N/A 10/11/2012   Procedure: LUMBAR LAMINECTOMY/DECOMPRESSION MICRODISCECTOMY 2 LEVELS;  Surgeon: Floyce Stakes, MD;  Location: [facility];  Service: Neurosurgery;  Laterality: N/A;  L3-4 L4-5 Laminectomy   LUMBAR WOUND DEBRIDEMENT N/A 09/27/2015   Procedure: Exploration of Lumbar Wound w/ Repair CSF Leak/Lumbar Drain Placement;  Surgeon: Leeroy Cha, MD;  Location: [facility];  Service: Neurosurgery;  Laterality: N/A;   MALONEY DILATION  03/16/2011   Gastritis. No H.pylori on bx. 76F maloney dilation with disruption of  occult cevical esophageal web   MALONEY DILATION N/A 02/16/2020   Procedure: MALONEY DILATION;  Surgeon: Daneil Dolin, MD;  [facility];  Service: Endoscopy;  Laterality: N/A;   NASAL SINUS SURGERY     right knee replacement  2007   right leg benign tumor     SHOULDER SURGERY Left    TEE WITHOUT CARDIOVERSION N/A 10/04/2017   Procedure: TRANSESOPHAGEAL ECHOCARDIOGRAM (TEE) WITH PROPOFOL;  Surgeon: Herminio Commons, MD;  Location: [facility];  Service: Cardiovascular;  Laterality: N/A;   TEE WITHOUT CARDIOVERSION N/A 11/10/2019   Procedure: TRANSESOPHAGEAL ECHOCARDIOGRAM (TEE);  Surgeon: Josue Hector, MD;  Location: [facility] END[facility];  Service: Cardiovascular;  Laterality: N/A;   TONSILLECTOMY     unspecified area,  hysterectomy  1972   partial     Allergies  Allergen Reactions   Cephalosporins Diarrhea and Nausea Only    Lightheaded   Levaquin [Levofloxacin In D5w] Swelling   Macrodantin [Nitrofurantoin Macrocrystal] Swelling   Phenothiazines Anaphylaxis and Hives   Polysorbate Anaphylaxis   Prednisone Shortness Of Breath   Buspirone Itching   Cardura [Doxazosin Mesylate] Itching   Codeine Itching   Acyclovir And Related Itching    Redness of skin   Colcrys [Colchicine] Nausea Only    Upset stomach    Prochlorperazine Other (See Comments)    "Upset stomach"   Ranexa [Ranolazine]     Severe drop in BP   Atorvastatin Hives    Cramping; tolerates Crestor ok   Colestipol Palpitations   Ofloxacin Rash   Other Itching and Rash    "WOOL"= make skin look like it has been burned   Penicillins Other (See Comments)    Causes redness all over. Has patient had a PCN reaction causing immediate rash, facial/tongue/throat swelling, SOB or lightheadedness with hypotension: No Has patient had a PCN reaction causing severe rash involving mucus membranes or skin necrosis: No Has patient had a PCN reaction that required hospitalization No Has patient had a PCN reaction occurring within the last 10 years: No If all of the above answers are "NO", then may proceed with Cephalosporin use.    Pimozide Hives and Itching      Family History  Problem Relation Age of Onset   Stroke Mother    Parkinson's disease Father    Coronary artery disease Other  family Hx-sons   Cancer Other    Stroke Other        family Hx   Hypertension Other        family Hx   Diabetes Brother    Heart disease Son        before age 47   Diabetes Son    Stroke Daughter 64   Colon cancer Grandson        diagnosed 55   Inflammatory bowel disease Neg Hx      Social History [name] reports that she quit smoking about 18 years ago. Her smoking use included cigarettes. She started smoking about 72 years ago. She  has a 64.00 pack-year smoking history. She has never used smokeless tobacco. [name] reports previous alcohol use.   Review of Systems CONSTITUTIONAL: No weight loss, fever, chills, weakness or fatigue.  HEENT: Eyes: No visual loss, blurred vision, double vision or yellow sclerae.No hearing loss, sneezing, congestion, runny nose or sore throat.  SKIN: No rash or itching.  CARDIOVASCULAR: per hpi RESPIRATORY: per hpi GASTROINTESTINAL: No anorexia, nausea, vomiting or diarrhea. No abdominal pain or blood.  GENITOURINARY: No burning on urination, no polyuria NEUROLOGICAL: No headache, dizziness, syncope, paralysis, ataxia, numbness or tingling in the extremities. No change in bowel or bladder control.  MUSCULOSKELETAL: No muscle, back pain, joint pain or stiffness.  LYMPHATICS: No enlarged nodes. No history of splenectomy.  PSYCHIATRIC: No history of depression or anxiety.  ENDOCRINOLOGIC: No reports of sweating, cold or heat intolerance. No polyuria or polydipsia.  [name] Kitchen   Physical Examination Today's Vitals   07/30/20 1306  BP: (!) 157/73  Pulse: 66  SpO2: 98%  Weight: 161 lb (73 kg)  Height: 5\' 1"  (1.549 m)   Body mass index is 30.42 kg/m.  Gen: resting comfortably, no acute distress HEENT: no scleral icterus, pupils equal round and reactive, no palptable cervical adenopathy,  CV: RRR, no m/r/g, no jvd Resp: Clear to auscultation bilaterally GI: abdomen is soft, non-tender, non-distended, normal bowel sounds, no hepatosplenomegaly MSK: extremities are warm, no edema.  Skin: warm, no rash Neuro:  no focal deficits Psych: appropriate affect   Diagnostic Studies Cardiac Catheterization: 2018-05-17 Patent short left main 20 to 30% proximal LAD irregularity, with diffuse 20% narrowing within the mid vessel. Patent proximal circumflex stent with mid to distal circumflex irregularities up to 30 to 40%. Nondominant right coronary with ostial 90% narrowing. No hemodynamics  recorded.  Left ventriculography was not performed in an effort to decrease contrast exposure in the setting of CKD.   RECOMMENDATIONS:   Compared to prior angiography in 2017, no significant changes noted. Chest pain is atypical and may be more musculoskeletal or pleuritic. Resume Eliquis in 12 hours/a.m. of 05/17/2018   Echocardiogram 10/02/18:   1. The left ventricle has normal systolic function with an ejection  fraction of 60-65%. The cavity size was normal. There is mildly increased  left ventricular wall thickness. Left ventricular diastolic Doppler  parameters are consistent with impaired  relaxation. Elevated mean left atrial pressure.   2. The right ventricle has normal systolic function. The cavity was  normal. There is no increase in right ventricular wall thickness.   3. Left atrial size was mildly dilated.   4. The aortic valve is tricuspid. Mild thickening of the aortic valve.  Mild calcification of the aortic valve. Aortic valve regurgitation is mild  by color flow Doppler. No stenosis of the aortic valve. Mild aortic  annular calcification noted.  5. The mitral valve is abnormal. Mild thickening of the mitral valve  leaflet. Mild calcification of the mitral valve leaflet. There is mild  mitral annular calcification present. No evidence of mitral valve  stenosis.   6. The aortic root is normal in size and structure.   7. Pulmonary hypertension is indeterminant, inadequate TR jet.      Event monitor 05/13/19:   1. NSR  2. NSVT, lasting upto 6 beats 3. No prolonged pauses or bradycardia 4. No atrial fib     09/2019 echo 1. Left ventricular ejection fraction, by estimation, is 60 to 65%. The  left ventricle has normal function. The left ventricle has no regional  wall motion abnormalities. There is mild left ventricular hypertrophy.  Left ventricular diastolic parameters  are indeterminate.   2. Right ventricular systolic function is normal. The right ventricular   size is normal. There is normal pulmonary artery systolic pressure.   3. The mitral valve is normal in structure. Trivial mitral valve  regurgitation. No evidence of mitral stenosis.   4. The aortic valve was not well visualized. Aortic valve regurgitation  is mild. No aortic stenosis is present.    05/2020 echo IMPRESSIONS     1. Left ventricular ejection fraction, by estimation, is 60 to 65%. The  left ventricle has normal function. The left ventricle has no regional  wall motion abnormalities. Left ventricular diastolic parameters are  indeterminate.   2. Right ventricular systolic function is normal. The right ventricular  size is normal.   3. The mitral valve is normal in structure. No evidence of mitral valve  regurgitation. No evidence of mitral stenosis.   4. The aortic valve has an indeterminant number of cusps. There is mild  calcification of the aortic valve. There is mild thickening of the aortic  valve. Aortic valve regurgitation is trivial. No aortic stenosis is  present.   5. The inferior vena cava is normal in size with greater than 50%  respiratory variability, suggesting right atrial pressure of 3 mmHg.   05/2020 ABI Summary:  Right: Resting right ankle-brachial index indicates moderate right lower  extremity arterial disease. The right toe-brachial index is abnormal. RT  great toe pressure = 74 mmHg.   Left: Resting left ankle-brachial index indicates moderate left lower  extremity arterial disease. The left toe-brachial index is abnormal. LT  Great toe pressure = 73 mmHg.     Assessment and Plan     1. Chronic diastolic HF - some recent symptoms, change lasix to 20mg  alternating days with 40mg  - obtain    2. NSVT - no recent symptoms, continue current meds   3. CAD - epigastric pains associated with eating, no cardiac symptoms Continue current meds      Arnoldo Lenis, M.D.

## 2020-07-30 NOTE — Patient Instructions (Addendum)
Your physician recommends that you schedule a follow-up appointment in: [place], NP  Your physician has recommended you make the following change in your medication:   CHANGE LASIX 20 MG EVERY OTHER DAY ALTERNATING WITH 40 MG EVERY OTHER DAY   Thank you for choosing [facility]!!

## 2020-08-02 ENCOUNTER — Telehealth: Payer: Self-pay | Admitting: *Deleted

## 2020-08-02 NOTE — Telephone Encounter (Signed)
-----   Message from Verta Ellen., NP sent at 07/30/2020  1:13 PM EDT ----- 14-day monitor showed some rare SVT episodes up to 19 beats.  She had one 7 beat run of nonsustained ventricular tachycardia.  Primary rhythm was normal sinus.  She had multiple symptoms reported in the diary but the symptoms were not reported with the date and time to correlate with the heart rhythm.  She had some minor incidents of atrial fibrillation around 2%

## 2020-08-02 NOTE — Telephone Encounter (Signed)
Laurine Blazer, LPN  [id number] 4:32 PM EDT Back to Top     Notified, copy to pcp.

## 2020-08-03 NOTE — Progress Notes (Signed)
Referring Provider: Rosalee Kaufman, * Primary Care Physician:  Rosalee Kaufman, PA-C Primary GI Physician: Dr. Gala Romney  Chief Complaint  Patient presents with  . Dysphagia    Gets strangled  . Bloated  . Gas    HPI:   [name] is a 87 y.o. female history of GERD, dysphagia, low-volume hematochezia in setting of hemorrhoids, chronic intermittent diarrhea, adenomatous colon polyps, and IDA noted in July 2021. EGD 02/16/2020 with normal esophagus s/p dilation, erythematous mucosa in the stomach biopsied, normal examined duodenum.  Pathology with mild gastropathy, negative for H. pylori.  She is presenting today for follow-up of anemia, rectal bleeding, diarrhea, and with acute complaint of lower abdominal pain s/p recent colonoscopy.  Last seen in our office 03/31/2020.  GERD was much improved with Protonix 40 mg twice daily.  Dysphagia resolved after dilation.  Reported worsening diarrhea x3 weeks with up to 9 BMs per day for couple of days, resolved for couple days, then returned.  Increased frequency and volume of rectal bleeding over the last 4 weeks.  Associated abdominal pain in the right lower quadrant and suprapubic area before bowel movements that improved about half hour to an hour later.  Imodium not helpful.  Limiting dairy in the setting of lactose intolerance.  No nausea or vomiting.  She was on Bactrim about 1 month ago for UTI and was briefly hospitalized in October.  Continues to have several black bowel movements though she was taking iron daily. Plan to update labs, check TSH and screen for celiac disease, evaluate for infectious diarrhea, and proceed with colonoscopy if stool studies were negative.  Labs completed 03/31/20: C. difficile and GI pathogen panel were negative.  Hemoglobin fairly stable at 10.9.  TSH within normal limits.  Kidney function stable.  Electrolytes essentially normal.  Rx sent for Colestid 2 g daily for diarrhea while awaiting  colonoscopy.  Colonoscopy 06/28/2020: With external hemorrhoids, 5 x 6 cm pressure sore left buttock, normal-appearing TI, normal-appearing colonic mucosa, internal hemorrhoids, biopsies taken to evaluate for microscopic colitis.  Pathology with mild acute/subacute nonspecific colitis with differential including ischemia, GERD, and infection.  Recommended avoiding all NSAIDs and trial Entocort 9 mg daily.  Most recent CBC 06/16/2020 with hemoglobin 9.8, MCH low at 24.2. Iron panel has not been updated since August 2021.  Today: Today her primary concern is pain across lower abdomen. 9/10 in severity.   States she had very mild pain starting about 6 months ago, but over the last month, she has had persistent pain.  Worsened with movement.  Keeps her up at night.  Not affected by meals or bowel movements.  BMs daily.  No constipation or diarrhea.  She is taking Entocort daily which significantly improved her diarrhea.  Never tried Colestid.  Denies BRBPR or melena.  Admits to dysuria x2 weeks.  Does not emptying her bladder well.  Denies fever or chills.  She does have chronic back pain.  States pain does wrap around to her abdomen at times and will radiate down her legs bilaterally.  GERD well controlled.   She is not taking any NSAIDs.  She has not taken oral iron in quite some time.  Past Medical History:  Diagnosis Date  . Anxiety   . Arthritis   . Atrial fibrillation ([place])   . Bursitis    Left shoulder  . Cataract   . CHF (congestive heart failure) ([place])   . CKD (chronic kidney disease)  stage 3-4  . COPD (chronic obstructive pulmonary disease) ([place])   . Coronary atherosclerosis of native coronary artery    a. s/p DES to LCx in 04/2013 b. cath in 11/2015 showing patent stent with 20% prox-LAD and 80% ostial RCA stenosis for which medical management was recommended due to small artery size  . Depression   . Diastolic heart failure (H[facility])    EF 55-60%  . Dysphagia, unspecified(787.20)    . Dyspnea   . Dysrhythmia   . Essential hypertension   . GERD (gastroesophageal reflux disease)    Hx Schatzki's ring, multiple EGD/ED last 01/06/2004  . Gout   . Headache   . History of anemia   . Hyperlipidemia   . Internal hemorrhoids without mention of complication   . MI (myocardial infarction) ([place]) 2006  . Microscopic colitis 2003  . Panic disorder without agoraphobia   . Paresthesia   . Pneumonia 12/2011  . PVD (peripheral vascular disease) ([place])   . S/P colonoscopy 09/27/2001   internal hemorrhoids, desc colon inflam polyp, SB BX-chronic duodenitis, colitis  . Sleep apnea   . Thyroid disease     Past Surgical History:  Procedure Laterality Date  . ABDOMINAL HYSTERECTOMY    . ABDOMINAL HYSTERECTOMY    . ANTERIOR AND POSTERIOR REPAIR     with resection of vagina  . ANTERIOR LAT LUMBAR FUSION N/A 08/01/2016   Procedure: Lumbar Two-Lumbar Five Transpsoas lateral interbody fusion with Lumbar Two-Three lateral plate fixation;  Surgeon: Kevan Ny Ditty, MD;  Location: [facility] [facility];  Service: Neurosurgery;  Laterality: N/A;  L2-5 Transpsoas lateral interbody fusion with L2-3 lateral plate fixation  . APPENDECTOMY    . BACK SURGERY    . BIOPSY  07/05/2015   Procedure: BIOPSY;  Surgeon: Daneil Dolin, MD;  [facility];  Service: Endoscopy;;  gastric polyp biopsy, ascending colon biopsy  . BIOPSY  02/16/2020   Procedure: BIOPSY;  Surgeon: Daneil Dolin, MD;  [facility];  Service: Endoscopy;;  . BIOPSY  06/28/2020   Procedure: BIOPSY;  Surgeon: Daneil Dolin, MD;  [facility];  Service: Endoscopy;;  . BLADDER SUSPENSION  11/09/2011   Procedure: TRANSVAGINAL TAPE (TVT) PROCEDURE;  Surgeon: Marissa Nestle, MD;  [facility];  Service: Urology;  Laterality: N/A;  . bladder tack  06/2010  . BREAST LUMPECTOMY  1998   left, benign  . CARDIAC CATHETERIZATION    . CARDIAC CATHETERIZATION    . CARDIAC CATHETERIZATION N/A 12/16/2015   Procedure:  Left Heart Cath and Coronary Angiography;  Surgeon: Troy Sine, MD;  Location: [facility];  Service: Cardiovascular;  Laterality: N/A;  . CARDIOVERSION N/A 10/04/2017   Procedure: CARDIOVERSION;  Surgeon: Herminio Commons, MD;  [facility];  Service: Cardiovascular;  Laterality: N/A;  . CARDIOVERSION N/A 01/30/2018   Procedure: CARDIOVERSION;  Surgeon: Herminio Commons, MD;  [facility];  Service: Cardiovascular;  Laterality: N/A;  . CARDIOVERSION N/A 11/10/2019   Procedure: CARDIOVERSION;  Surgeon: Josue Hector, MD;  Location: [facility];  Service: Cardiovascular;  Laterality: N/A;  . [place]   left  . cataract surgery    . CHOLECYSTECTOMY  1998  . Cholecystectomy    . COLONOSCOPY  03/16/2011   multiple hyperplastic colon polyps, sigmoid diverticulosis, melanosis coli  . COLONOSCOPY WITH PROPOFOL N/A 07/05/2015   RMR:one 5 mm polyp in descending colon  . COLONOSCOPY WITH PROPOFOL N/A 06/28/2020  Procedure: COLONOSCOPY WITH PROPOFOL;  Surgeon: Daneil Dolin, MD;  [facility];  Service: Endoscopy;  Laterality: N/A;  am appt  . CORONARY ANGIOGRAPHY N/A 05/16/2018   Procedure: CORONARY ANGIOGRAPHY (CATH LAB);  Surgeon: Belva Crome, MD;  Location: [facility];  Service: Cardiovascular;  Laterality: N/A;  . CORONARY ANGIOPLASTY WITH STENT PLACEMENT  2015  . ESOPHAGEAL DILATION N/A 07/05/2015   Procedure: ESOPHAGEAL DILATION;  Surgeon: Daneil Dolin, MD;  [facility];  Service: Endoscopy;  Laterality: N/A;  . ESOPHAGOGASTRODUODENOSCOPY (EGD) WITH PROPOFOL N/A 07/05/2015   ATF:TDDUKG  . ESOPHAGOGASTRODUODENOSCOPY (EGD) WITH PROPOFOL N/A 02/16/2020   Procedure: ESOPHAGOGASTRODUODENOSCOPY (EGD) WITH PROPOFOL;  Surgeon: Daneil Dolin, MD;  Normal examined esophagus s/p dilation, erythematous mucosa in the stomach s/p biopsy, normal examined duodenum. Pathology with mild gastropathy, negative for H. Pylori.    [name] Kitchen JOINT REPLACEMENT Right 2007   right knee  . left hand surgery    . LEFT HEART CATHETERIZATION WITH CORONARY ANGIOGRAM N/A 05/14/2013   Procedure: LEFT HEART CATHETERIZATION WITH CORONARY ANGIOGRAM;  Surgeon: Blane Ohara, MD;  Location: [facility] CATH LAB;  Service: Cardiovascular;  Laterality: N/A;  . left rotator cuff surgery    . LUMBAR LAMINECTOMY/DECOMPRESSION MICRODISCECTOMY N/A 10/11/2012   Procedure: LUMBAR LAMINECTOMY/DECOMPRESSION MICRODISCECTOMY 2 LEVELS;  Surgeon: Floyce Stakes, MD;  Location: [facility] [facility];  Service: Neurosurgery;  Laterality: N/A;  L3-4 L4-5 Laminectomy  . LUMBAR WOUND DEBRIDEMENT N/A 09/27/2015   Procedure: Exploration of Lumbar Wound w/ Repair CSF Leak/Lumbar Drain Placement;  Surgeon: Leeroy Cha, MD;  Location: [place] [facility];  Service: Neurosurgery;  Laterality: N/A;  . MALONEY DILATION  03/16/2011   Gastritis. No H.pylori on bx. 97F maloney dilation with disruption of  occult cevical esophageal web  . MALONEY DILATION N/A 02/16/2020   Procedure: Venia Minks DILATION;  Surgeon: Daneil Dolin, MD;  [facility];  Service: Endoscopy;  Laterality: N/A;  . NASAL SINUS SURGERY    . right knee replacement  2007  . right leg benign tumor    . SHOULDER SURGERY Left   . TEE WITHOUT CARDIOVERSION N/A 10/04/2017   Procedure: TRANSESOPHAGEAL ECHOCARDIOGRAM (TEE) WITH PROPOFOL;  Surgeon: Herminio Commons, MD;  [facility];  Service: Cardiovascular;  Laterality: N/A;  . TEE WITHOUT CARDIOVERSION N/A 11/10/2019   Procedure: TRANSESOPHAGEAL ECHOCARDIOGRAM (TEE);  Surgeon: Josue Hector, MD;  Location: [facility] END[facility];  Service: Cardiovascular;  Laterality: N/A;  . TONSILLECTOMY    . unspecified area, hysterectomy  1972   partial    Current Outpatient Medications  Medication Sig Dispense Refill  . acetaminophen (TYLENOL) 500 MG tablet Take 500 mg by mouth every 6 (six) hours as needed for headache or moderate pain.    [name] Kitchen albuterol (PROVENTIL) (2.5  MG/3ML) 0.083% nebulizer solution Take 2.5 mg by nebulization 2 (two) times daily.    [name] Kitchen ALPRAZolam (XANAX) 0.5 MG tablet Take 0.5 mg by mouth daily as needed for anxiety or sleep.    [name] Kitchen apixaban (ELIQUIS) 5 MG TABS tablet Take 1 tablet (5 mg total) by mouth 2 (two) times daily. 180 tablet 3  . Budeson-Glycopyrrol-Formoterol (BREZTRI AEROSPHERE) 160-9-4.8 MCG/ACT AERO Inhale into the lungs.    . budesonide (ENTOCORT EC) 3 MG 24 hr capsule Take 3 capsules (9 mg total) by mouth daily. 90 capsule 1  . diltiazem (CARDIZEM CD) 120 MG 24 hr capsule TAKE ONE CAPSULE BY MOUTH DAILY (Patient taking differently: Take 120 mg by mouth daily.)  90 capsule 1  . DULoxetine (CYMBALTA) 60 MG capsule Take 60 mg by mouth at bedtime.    . fluticasone (CUTIVATE) 0.05 % cream Apply 1 application topically 2 (two) times daily as needed (skin tears).    . furosemide (LASIX) 40 MG tablet TAKE 20 MG EVERY OTHER DAY ALTERNATING WITH 40 MG EVERY OTHER DAY 135 tablet 1  . hydrocortisone (ANUSOL-HC) 2.5 % rectal cream Place 1 application rectally 2 (two) times daily. (Patient taking differently: Place 1 application rectally 3 (three) times a week.) 30 g 1  . isosorbide mononitrate (IMDUR) 60 MG 24 hr tablet Take 60 mg by mouth daily.    . magnesium oxide (MAG-OX) 400 MG tablet TAKE ONE TABLET BY MOUTH DAILY. (Patient taking differently: Take 400 mg by mouth daily.) 90 tablet 3  . Melatonin 10 MG TABS Take 10 mg by mouth daily as needed (Sleep).    . metoprolol succinate (TOPROL-XL) 25 MG 24 hr tablet Take 75 mg by mouth daily.    . Multiple Vitamins-Minerals (HAIR/SKIN/NAILS) CAPS Take 1 tablet by mouth daily.    . Naphazoline HCl (CLEAR EYES OP) Place 1 drop into both eyes daily as needed (itching).    . nitroGLYCERIN (NITROSTAT) 0.4 MG SL tablet Place 1 tablet (0.4 mg total) under the tongue every 5 (five) minutes as needed for chest pain. Reported on 08/04/2015 25 tablet 6  . pantoprazole (PROTONIX) 40 MG tablet TAKE ONE TABLET  BY MOUTH TWICE DAILY BEFORE MEALS 60 tablet 3  . potassium chloride (KLOR-CON) 10 MEQ tablet Take 1 tablet (10 mEq total) by mouth daily. 180 tablet 1   No current facility-administered medications for this visit.    Allergies as of 08/04/2020 - Review Complete 08/04/2020  Allergen Reaction Noted  . Cephalosporins Diarrhea and Nausea Only 08/04/2015  . Levaquin [levofloxacin in d5w] Swelling 07/06/2014  . Macrodantin [nitrofurantoin macrocrystal] Swelling 07/06/2014  . Phenothiazines Anaphylaxis and Hives 08/16/2011  . Polysorbate Anaphylaxis 08/16/2011  . Prednisone Shortness Of Breath   . Buspirone Itching 07/06/2014  . Cardura [doxazosin mesylate] Itching 07/06/2014  . Codeine Itching   . Acyclovir and related Itching 10/07/2015  . Colcrys [colchicine] Nausea Only 10/04/2018  . Prochlorperazine Other (See Comments) 08/16/2011  . Ranexa [ranolazine]  04/06/2015  . Atorvastatin Hives 08/16/2011  . Colestipol Palpitations 05/03/2020  . Ofloxacin Rash   . Other Itching and Rash 10/10/2012  . Penicillins Other (See Comments) 11/09/2011  . Pimozide Hives and Itching 08/16/2011    Family History  Problem Relation Age of Onset  . Stroke Mother   . Parkinson's disease Father   . Coronary artery disease Other        family Hx-sons  . Cancer Other   . Stroke Other        family Hx  . Hypertension Other        family Hx  . Diabetes Brother   . Heart disease Son        before age 24  . Diabetes Son   . Stroke Daughter 27  . Colon cancer Grandson        diagnosed 50  . Inflammatory bowel disease Neg Hx     Social History   Socioeconomic History  . Marital status: Divorced    Spouse name: Not on file  . Number of children: 5  . Years of education: Not on file  . Highest education level: Not on file  Occupational History  . Occupation: retired  Tobacco Use  .  Smoking status: Former Smoker    Packs/day: 1.00    Years: 64.00    Pack years: 64.00    Types: Cigarettes     Start date: 12/24/1947    Quit date: 11/17/2001    Years since quitting: 18.7  . Smokeless tobacco: Never Used  . Tobacco comment: Quit smoking in 2003  Vaping Use  . Vaping Use: Never used  Substance and Sexual Activity  . Alcohol use: Not Currently    Alcohol/week: 0.0 standard drinks    Comment: only at new years.   . Drug use: No  . Sexual activity: Never  Other Topics Concern  . Not on file  Social History Narrative   Divorced.   Sister had colon perforation & died from complications in [place], [place]   Social Determinants of Health   Financial Resource Strain: Not on file  Food Insecurity: Not on file  Transportation Needs: Not on file  Physical Activity: Not on file  Stress: Not on file  Social Connections: Not on file    Review of Systems: Gen: See HPI CV: Denies chest pain or palpitations. Resp: Denies dyspnea or cough. GI: See HPI.  Heme: See HPI  Physical Exam: BP (!) 146/70   Pulse 88   Temp (!) 96.8 F (36 C) (Temporal)   Ht 5\' 1"  (1.549 m)   Wt 160 lb 9.6 oz (72.8 kg)   BMI 30.35 kg/m  General:   Alert and oriented. No distress noted. Pleasant and cooperative.  Head:  Normocephalic and atraumatic. Eyes:  Conjuctiva clear without scleral icterus. Heart:  S1, S2 present without murmurs appreciated. Lungs:  Clear to auscultation bilaterally. No wheezes, rales, or rhonchi. No distress.  Abdomen:  +BS, soft, and non-distended.  Moderate TTP across lower abdomen.  No rebound or guarding. No HSM or masses noted. Msk:  Symmetrical without gross deformities. Normal posture.  Tender to palpation across essentially all of her mid to lower back though most prominent paraspinal.  Extremities:  Without edema. Neurologic:  Alert and  oriented x4 Psych: Normal mood and affect.   Assessment: 87 year old female presenting today for follow-up of anemia, diarrhea, and with acute complaint of lower abdominal pain.  Lower abdominal pain: Mild intermittent lower abdominal  pain x6 months that became persistent and more severe over the last 1 month, currently about 9/10 in severity, keeping her up at night, worsened by movement.  Denies association with meals or bowel movements.  Colonoscopy in March for diarrhea with normal-appearing colonic mucosa and biopsies revealing mild acute/subacute nonspecific colitis.  She was started on Entocort 9 mg daily and her diarrhea resolved.  She does admit to dysuria as well as chronic back pain that radiates to her abdomen and down her legs bilaterally.  Denies nausea, vomiting, fever, chills.  On exam, she has mild to moderate TTP across lower abdomen without rebound or guarding.  Also with tenderness to palpation of essentially her entire mid to lower back, primarily paraspinal.  Differentials include complicated UTI, colitis, GYN etiology, MSK related to chronic back pain.  We will update labs in obtain CT A/P for further evaluation.  Diarrhea: Resolved with Entocort 9 mg daily.  Prior evaluation with C. difficile and GI pathogen panel negative, TSH within normal limits.  Colonoscopy in March 2022 with normal-appearing colonic mucosa, random colon biopsies with mild acute/subacute nonspecific colitis.  Dr. Gala Romney recommended avoiding NSAIDs and trial of Entocort 9 mg daily. I plan to discuss this further with Dr. Gala Romney.  May  consider treating for 8 weeks, tapering to 6 mg x 2 weeks, then 3 mg x 2 weeks, then discontinue and monitor for return of symptoms.  Further recommendations to follow.  Anemia: Found to have iron deficiency anemia in July 2021. EGD in November 2021 with normal esophagus, erythematous mucosa in the stomach but pathology consistent with mild gastropathy and negative for H. pylori, normal examined duodenum.  Colonoscopy March 2022 with external and internal hemorrhoids, normal-appearing TI, normal-appearing colonic mucosa s/p random colon biopsies.  Pathology with mild acute/subacute nonspecific colitis.  2 started on  Entocort 9 mg daily.  Most recent labs March 2022 with hemoglobin stable at 9.8, MCH low at 24.2.  Iron panel has not been updated since August 2021.  She denies overt GI bleeding.  Denies NSAIDs.  She has not been on oral iron in quite some time.  We will plan to update labs with further recommendations to follow.  We will likely need to consider Givens capsule pending no acute abnormalities on CT which we are pursuing for abdominal pain as per above.  GERD: Remains well controlled on Protonix 40 mg twice daily.  She will continue her current medications.  Plan: 1.  CBC, CMP, iron panel ferritin, UA, urine culture.  2.  CT A/P with contrast stat.  3.  Continue Entocort 9 mg daily for now.  I will discuss further with Dr. Gala Romney regarding plans moving forward with possibly tapering after 8 weeks and monitoring for return of diarrhea.  4.  Continue Protonix 40 mg twice daily.  5.  Continue to avoid all NSAIDs.  6.  Further recommendations to follow CT and labs.  Will likely need Givens capsule for further evaluation of IDA if no acute abnormalities on CT.    Aliene Altes, PA-C [facility] 08/04/2020

## 2020-08-04 ENCOUNTER — Ambulatory Visit (INDEPENDENT_AMBULATORY_CARE_PROVIDER_SITE_OTHER): Payer: PPO | Admitting: Gastroenterology

## 2020-08-04 ENCOUNTER — Other Ambulatory Visit: Payer: Self-pay

## 2020-08-04 ENCOUNTER — Other Ambulatory Visit (HOSPITAL_COMMUNITY)
Admission: RE | Admit: 2020-08-04 | Discharge: 2020-08-04 | Disposition: A | Discharge status: Home / Self Care | Payer: PPO | Source: Other Acute Inpatient Hospital | Attending: Gastroenterology | Admitting: Gastroenterology

## 2020-08-04 ENCOUNTER — Encounter: Payer: Self-pay | Admitting: *Deleted

## 2020-08-04 ENCOUNTER — Encounter: Payer: Self-pay | Admitting: Gastroenterology

## 2020-08-04 VITALS — BP 146/70 | HR 88 | Temp 96.8°F | Ht 61.0 in | Wt 160.6 lb

## 2020-08-04 DIAGNOSIS — K219 Gastro-esophageal reflux disease without esophagitis: Secondary | ICD-10-CM

## 2020-08-04 DIAGNOSIS — R1030 Lower abdominal pain, unspecified: Secondary | ICD-10-CM

## 2020-08-04 DIAGNOSIS — D649 Anemia, unspecified: Secondary | ICD-10-CM | POA: Insufficient documentation

## 2020-08-04 DIAGNOSIS — R197 Diarrhea, unspecified: Secondary | ICD-10-CM | POA: Diagnosis not present

## 2020-08-04 LAB — URINALYSIS, ROUTINE W REFLEX MICROSCOPIC
Bilirubin Urine: NEGATIVE
Glucose, UA: NEGATIVE mg/dL
Ketones, ur: NEGATIVE mg/dL
Nitrite: NEGATIVE
Protein, ur: NEGATIVE mg/dL
Specific Gravity, Urine: 1.013 (ref 1.005–1.030)
WBC, UA: >50 WBC/hpf — ABNORMAL HIGH (ref 0–5)
pH: 5 (ref 5.0–8.0)

## 2020-08-04 LAB — CBC WITH DIFFERENTIAL/PLATELET
Abs Immature Granulocytes: 0.34 10*3/uL — ABNORMAL HIGH (ref 0.00–0.07)
Basophils Absolute: 0.1 10*3/uL (ref 0.0–0.1)
Basophils Relative: 1 %
Eosinophils Absolute: 0.1 10*3/uL (ref 0.0–0.5)
Eosinophils Relative: 1 %
HCT: 31.8 % — ABNORMAL LOW (ref 36.0–46.0)
Hemoglobin: 9.6 g/dL — ABNORMAL LOW (ref 12.0–15.0)
Immature Granulocytes: 3 %
Lymphocytes Relative: 19 %
Lymphs Abs: 2.2 10*3/uL (ref 0.7–4.0)
MCH: 22.5 pg — ABNORMAL LOW (ref 26.0–34.0)
MCHC: 30.2 g/dL (ref 30.0–36.0)
MCV: 74.5 fL — ABNORMAL LOW (ref 80.0–100.0)
Monocytes Absolute: 0.8 10*3/uL (ref 0.1–1.0)
Monocytes Relative: 7 %
Neutro Abs: 7.9 10*3/uL — ABNORMAL HIGH (ref 1.7–7.7)
Neutrophils Relative %: 69 %
Platelets: 436 10*3/uL — ABNORMAL HIGH (ref 150–400)
RBC: 4.27 MIL/uL (ref 3.87–5.11)
RDW: 18.5 % — ABNORMAL HIGH (ref 11.5–15.5)
WBC: 11.4 10*3/uL — ABNORMAL HIGH (ref 4.0–10.5)
nRBC: 0 % (ref 0.0–0.2)

## 2020-08-04 LAB — COMPREHENSIVE METABOLIC PANEL
ALT: 17 U/L (ref 0–44)
AST: 17 U/L (ref 15–41)
Albumin: 3.6 g/dL (ref 3.5–5.0)
Alkaline Phosphatase: 59 U/L (ref 38–126)
Anion gap: 11 (ref 5–15)
BUN: 35 mg/dL — ABNORMAL HIGH (ref 8–23)
CO2: 26 mmol/L (ref 22–32)
Calcium: 8.5 mg/dL — ABNORMAL LOW (ref 8.9–10.3)
Chloride: 97 mmol/L — ABNORMAL LOW (ref 98–111)
Creatinine, Ser: 1.3 mg/dL — ABNORMAL HIGH (ref 0.44–1.00)
GFR, Estimated: 40 mL/min — ABNORMAL LOW (ref >60)
Glucose, Bld: 129 mg/dL — ABNORMAL HIGH (ref 70–99)
Potassium: 4.2 mmol/L (ref 3.5–5.1)
Sodium: 134 mmol/L — ABNORMAL LOW (ref 135–145)
Total Bilirubin: 0.3 mg/dL (ref 0.3–1.2)
Total Protein: 7.5 g/dL (ref 6.5–8.1)

## 2020-08-04 LAB — FERRITIN: Ferritin: 9 ng/mL — ABNORMAL LOW (ref 11–307)

## 2020-08-04 LAB — IRON AND TIBC
Iron: 18 ug/dL — ABNORMAL LOW (ref 28–170)
Saturation Ratios: 3 % — ABNORMAL LOW (ref 10.4–31.8)
TIBC: 602 ug/dL — ABNORMAL HIGH (ref 250–450)
UIBC: 584 ug/dL

## 2020-08-04 NOTE — Patient Instructions (Signed)
Please have labs completed today.  We will arrange for you to have a CT scan of your abdomen and pelvis to further evaluate your abdominal pain.  We will try to get this scheduled for tomorrow.  Continue Entocort 9 mg daily for now.  I will discuss this with Dr. Gala Romney.  Continue Protonix 40 mg twice daily 30 minutes before breakfast and dinner for acid reflux.  Continue to avoid all NSAID products including ibuprofen, Aleve, Advil, naproxen, Goody powders, BC powders, and anything that says "NSAID" on the package.  Further recommendations to follow CT and lab results.  If you have any worsening symptoms or fever, proceed to the emergency room.  Aliene Altes, PA-C [facility]

## 2020-08-05 ENCOUNTER — Ambulatory Visit (HOSPITAL_COMMUNITY)
Admission: RE | Admit: 2020-08-05 | Discharge: 2020-08-05 | Disposition: A | Discharge status: Home / Self Care | Payer: PPO | Source: Ambulatory Visit | Attending: Gastroenterology | Admitting: Gastroenterology

## 2020-08-05 ENCOUNTER — Encounter: Payer: Self-pay | Admitting: Gastroenterology

## 2020-08-05 DIAGNOSIS — R109 Unspecified abdominal pain: Secondary | ICD-10-CM | POA: Diagnosis not present

## 2020-08-05 DIAGNOSIS — R1030 Lower abdominal pain, unspecified: Secondary | ICD-10-CM | POA: Insufficient documentation

## 2020-08-05 MED ORDER — IOHEXOL 300 MG/ML  SOLN
75.0000 mL | Freq: Once | INTRAMUSCULAR | Status: AC | PRN
  Administered 2020-08-05: 75 mL via INTRAVENOUS

## 2020-08-07 LAB — URINE CULTURE: Culture: >=100000 — AB

## 2020-08-09 ENCOUNTER — Telehealth: Payer: Self-pay | Admitting: Gastroenterology

## 2020-08-09 ENCOUNTER — Other Ambulatory Visit: Payer: Self-pay | Admitting: Gastroenterology

## 2020-08-09 ENCOUNTER — Encounter: Payer: Self-pay | Admitting: *Deleted

## 2020-08-09 ENCOUNTER — Encounter: Payer: Self-pay | Admitting: Internal Medicine

## 2020-08-09 DIAGNOSIS — R197 Diarrhea, unspecified: Secondary | ICD-10-CM

## 2020-08-09 DIAGNOSIS — D649 Anemia, unspecified: Secondary | ICD-10-CM

## 2020-08-09 DIAGNOSIS — K523 Indeterminate colitis: Secondary | ICD-10-CM

## 2020-08-09 DIAGNOSIS — N3000 Acute cystitis without hematuria: Secondary | ICD-10-CM

## 2020-08-09 MED ORDER — BUDESONIDE 3 MG PO CPEP: ORAL_CAPSULE | ORAL | 0 refills | Status: DC

## 2020-08-09 MED ORDER — FERROUS SULFATE 325 (65 FE) MG PO TABS: 325.0000 mg | ORAL_TABLET | Freq: Every day | ORAL | 3 refills | Status: DC

## 2020-08-09 MED ORDER — SULFAMETHOXAZOLE-TRIMETHOPRIM 800-160 MG PO TABS: 1.0000 | ORAL_TABLET | Freq: Two times a day (BID) | ORAL | 0 refills | Status: DC

## 2020-08-09 NOTE — Telephone Encounter (Signed)
Spoke with pt. Pt was notified of results and recommendations for Entocort. Pt will pick up new prescription and start it after she has completed her 8 weeks. Pt will monitor for diarrhea and let our office know if it returns.

## 2020-08-09 NOTE — Telephone Encounter (Signed)
[name]  We will plan to complete an 8-week course of Entocort 9 mg daily.  She should be on week 6 of her 8-week course at this time.  After she completes 8 weeks, she will reduce to 6 mg x 2 weeks, then reduce to 3 mg x 2 weeks, then stop. I am sending in a new Rx for the 6 mg and 3 mg taper.   During this taper period, she will need to monitor for return of diarrhea and let us know if this occurs.  Would like to arrange a follow-up in 8 weeks. [name] please arrange. Dx: Indeterminate colitis, anemia.

## 2020-08-14 DIAGNOSIS — Z87891 Personal history of nicotine dependence: Secondary | ICD-10-CM | POA: Diagnosis not present

## 2020-08-14 DIAGNOSIS — J440 Chronic obstructive pulmonary disease with acute lower respiratory infection: Secondary | ICD-10-CM | POA: Diagnosis not present

## 2020-08-14 DIAGNOSIS — I5033 Acute on chronic diastolic (congestive) heart failure: Secondary | ICD-10-CM | POA: Diagnosis not present

## 2020-08-14 DIAGNOSIS — I130 Hypertensive heart and chronic kidney disease with heart failure and stage 1 through stage 4 chronic kidney disease, or unspecified chronic kidney disease: Secondary | ICD-10-CM | POA: Diagnosis not present

## 2020-08-14 DIAGNOSIS — N1832 Chronic kidney disease, stage 3b: Secondary | ICD-10-CM | POA: Diagnosis not present

## 2020-08-26 DIAGNOSIS — S39012A Strain of muscle, fascia and tendon of lower back, initial encounter: Secondary | ICD-10-CM | POA: Diagnosis not present

## 2020-08-26 DIAGNOSIS — G629 Polyneuropathy, unspecified: Secondary | ICD-10-CM | POA: Diagnosis not present

## 2020-08-26 DIAGNOSIS — L89309 Pressure ulcer of unspecified buttock, unspecified stage: Secondary | ICD-10-CM | POA: Diagnosis not present

## 2020-08-26 DIAGNOSIS — Z6831 Body mass index (BMI) 31.0-31.9, adult: Secondary | ICD-10-CM | POA: Diagnosis not present

## 2020-08-29 DIAGNOSIS — J449 Chronic obstructive pulmonary disease, unspecified: Secondary | ICD-10-CM | POA: Diagnosis not present

## 2020-08-30 ENCOUNTER — Ambulatory Visit (HOSPITAL_COMMUNITY)
Admission: RE | Admit: 2020-08-30 | Discharge: 2020-08-30 | Disposition: A | Discharge status: Home / Self Care | Payer: PPO | Attending: Internal Medicine | Admitting: Internal Medicine

## 2020-08-30 ENCOUNTER — Encounter (HOSPITAL_COMMUNITY)
Admission: RE | Disposition: A | Discharge status: Home / Self Care | Payer: Self-pay | Source: Home / Self Care | Attending: Internal Medicine

## 2020-08-30 DIAGNOSIS — D649 Anemia, unspecified: Secondary | ICD-10-CM | POA: Diagnosis not present

## 2020-08-30 HISTORY — PX: AGILE CAPSULE: SHX5420

## 2020-08-30 SURGERY — AGILE CAPSULE

## 2020-08-31 ENCOUNTER — Other Ambulatory Visit: Payer: Self-pay

## 2020-08-31 ENCOUNTER — Ambulatory Visit (HOSPITAL_COMMUNITY)
Admission: RE | Admit: 2020-08-31 | Discharge: 2020-08-31 | Disposition: A | Discharge status: Home / Self Care | Payer: PPO | Source: Ambulatory Visit | Attending: Gastroenterology | Admitting: Gastroenterology

## 2020-08-31 ENCOUNTER — Encounter (HOSPITAL_COMMUNITY): Payer: Self-pay | Admitting: Internal Medicine

## 2020-08-31 DIAGNOSIS — Z981 Arthrodesis status: Secondary | ICD-10-CM | POA: Diagnosis not present

## 2020-08-31 DIAGNOSIS — D649 Anemia, unspecified: Secondary | ICD-10-CM | POA: Diagnosis not present

## 2020-08-31 DIAGNOSIS — Z9049 Acquired absence of other specified parts of digestive tract: Secondary | ICD-10-CM | POA: Diagnosis not present

## 2020-08-31 DIAGNOSIS — T184XXA Foreign body in colon, initial encounter: Secondary | ICD-10-CM | POA: Diagnosis not present

## 2020-09-13 DIAGNOSIS — I130 Hypertensive heart and chronic kidney disease with heart failure and stage 1 through stage 4 chronic kidney disease, or unspecified chronic kidney disease: Secondary | ICD-10-CM | POA: Diagnosis not present

## 2020-09-13 DIAGNOSIS — Z87891 Personal history of nicotine dependence: Secondary | ICD-10-CM | POA: Diagnosis not present

## 2020-09-13 DIAGNOSIS — N1832 Chronic kidney disease, stage 3b: Secondary | ICD-10-CM | POA: Diagnosis not present

## 2020-09-13 DIAGNOSIS — J440 Chronic obstructive pulmonary disease with acute lower respiratory infection: Secondary | ICD-10-CM | POA: Diagnosis not present

## 2020-09-13 DIAGNOSIS — I5033 Acute on chronic diastolic (congestive) heart failure: Secondary | ICD-10-CM | POA: Diagnosis not present

## 2020-09-29 ENCOUNTER — Emergency Department (HOSPITAL_COMMUNITY): Payer: PPO

## 2020-09-29 ENCOUNTER — Other Ambulatory Visit: Payer: Self-pay

## 2020-09-29 ENCOUNTER — Encounter (HOSPITAL_COMMUNITY): Payer: Self-pay

## 2020-09-29 ENCOUNTER — Inpatient Hospital Stay (HOSPITAL_COMMUNITY)
Admission: EM | Admit: 2020-09-29 | Discharge: 2020-10-01 | DRG: 177 | Disposition: A | Discharge status: Home / Self Care | Payer: PPO | Attending: Family Medicine | Admitting: Family Medicine

## 2020-09-29 DIAGNOSIS — R0602 Shortness of breath: Secondary | ICD-10-CM | POA: Diagnosis present

## 2020-09-29 DIAGNOSIS — E871 Hypo-osmolality and hyponatremia: Secondary | ICD-10-CM | POA: Diagnosis not present

## 2020-09-29 DIAGNOSIS — R079 Chest pain, unspecified: Secondary | ICD-10-CM | POA: Diagnosis not present

## 2020-09-29 DIAGNOSIS — N184 Chronic kidney disease, stage 4 (severe): Secondary | ICD-10-CM | POA: Diagnosis not present

## 2020-09-29 DIAGNOSIS — Z955 Presence of coronary angioplasty implant and graft: Secondary | ICD-10-CM

## 2020-09-29 DIAGNOSIS — J849 Interstitial pulmonary disease, unspecified: Secondary | ICD-10-CM | POA: Diagnosis present

## 2020-09-29 DIAGNOSIS — E1122 Type 2 diabetes mellitus with diabetic chronic kidney disease: Secondary | ICD-10-CM | POA: Diagnosis present

## 2020-09-29 DIAGNOSIS — I2510 Atherosclerotic heart disease of native coronary artery without angina pectoris: Secondary | ICD-10-CM | POA: Diagnosis not present

## 2020-09-29 DIAGNOSIS — E1151 Type 2 diabetes mellitus with diabetic peripheral angiopathy without gangrene: Secondary | ICD-10-CM | POA: Diagnosis present

## 2020-09-29 DIAGNOSIS — Z28310 Unvaccinated for covid-19: Secondary | ICD-10-CM

## 2020-09-29 DIAGNOSIS — N179 Acute kidney failure, unspecified: Secondary | ICD-10-CM | POA: Diagnosis present

## 2020-09-29 DIAGNOSIS — R1032 Left lower quadrant pain: Secondary | ICD-10-CM | POA: Diagnosis not present

## 2020-09-29 DIAGNOSIS — Z881 Allergy status to other antibiotic agents status: Secondary | ICD-10-CM

## 2020-09-29 DIAGNOSIS — G4730 Sleep apnea, unspecified: Secondary | ICD-10-CM | POA: Diagnosis present

## 2020-09-29 DIAGNOSIS — I10 Essential (primary) hypertension: Secondary | ICD-10-CM | POA: Diagnosis not present

## 2020-09-29 DIAGNOSIS — Z885 Allergy status to narcotic agent status: Secondary | ICD-10-CM

## 2020-09-29 DIAGNOSIS — Z87891 Personal history of nicotine dependence: Secondary | ICD-10-CM

## 2020-09-29 DIAGNOSIS — R0789 Other chest pain: Secondary | ICD-10-CM | POA: Diagnosis present

## 2020-09-29 DIAGNOSIS — J449 Chronic obstructive pulmonary disease, unspecified: Secondary | ICD-10-CM

## 2020-09-29 DIAGNOSIS — I480 Paroxysmal atrial fibrillation: Secondary | ICD-10-CM | POA: Diagnosis present

## 2020-09-29 DIAGNOSIS — Z833 Family history of diabetes mellitus: Secondary | ICD-10-CM

## 2020-09-29 DIAGNOSIS — A0839 Other viral enteritis: Secondary | ICD-10-CM | POA: Diagnosis present

## 2020-09-29 DIAGNOSIS — Z79899 Other long term (current) drug therapy: Secondary | ICD-10-CM

## 2020-09-29 DIAGNOSIS — U071 COVID-19: Secondary | ICD-10-CM | POA: Diagnosis not present

## 2020-09-29 DIAGNOSIS — M1990 Unspecified osteoarthritis, unspecified site: Secondary | ICD-10-CM | POA: Diagnosis not present

## 2020-09-29 DIAGNOSIS — J440 Chronic obstructive pulmonary disease with acute lower respiratory infection: Secondary | ICD-10-CM | POA: Diagnosis present

## 2020-09-29 DIAGNOSIS — I509 Heart failure, unspecified: Secondary | ICD-10-CM

## 2020-09-29 DIAGNOSIS — Z800 Family history of malignant neoplasm of digestive organs: Secondary | ICD-10-CM

## 2020-09-29 DIAGNOSIS — K219 Gastro-esophageal reflux disease without esophagitis: Secondary | ICD-10-CM | POA: Diagnosis not present

## 2020-09-29 DIAGNOSIS — J1282 Pneumonia due to coronavirus disease 2019: Secondary | ICD-10-CM | POA: Diagnosis not present

## 2020-09-29 DIAGNOSIS — I5032 Chronic diastolic (congestive) heart failure: Secondary | ICD-10-CM | POA: Diagnosis not present

## 2020-09-29 DIAGNOSIS — E441 Mild protein-calorie malnutrition: Secondary | ICD-10-CM | POA: Diagnosis present

## 2020-09-29 DIAGNOSIS — R1111 Vomiting without nausea: Secondary | ICD-10-CM | POA: Diagnosis not present

## 2020-09-29 DIAGNOSIS — E039 Hypothyroidism, unspecified: Secondary | ICD-10-CM | POA: Diagnosis not present

## 2020-09-29 DIAGNOSIS — N189 Chronic kidney disease, unspecified: Secondary | ICD-10-CM

## 2020-09-29 DIAGNOSIS — Z880 Allergy status to penicillin: Secondary | ICD-10-CM

## 2020-09-29 DIAGNOSIS — I130 Hypertensive heart and chronic kidney disease with heart failure and stage 1 through stage 4 chronic kidney disease, or unspecified chronic kidney disease: Secondary | ICD-10-CM | POA: Diagnosis present

## 2020-09-29 DIAGNOSIS — G629 Polyneuropathy, unspecified: Secondary | ICD-10-CM

## 2020-09-29 DIAGNOSIS — E785 Hyperlipidemia, unspecified: Secondary | ICD-10-CM | POA: Diagnosis not present

## 2020-09-29 DIAGNOSIS — E8809 Other disorders of plasma-protein metabolism, not elsewhere classified: Secondary | ICD-10-CM | POA: Diagnosis not present

## 2020-09-29 DIAGNOSIS — Z823 Family history of stroke: Secondary | ICD-10-CM

## 2020-09-29 DIAGNOSIS — Z820 Family history of epilepsy and other diseases of the nervous system: Secondary | ICD-10-CM

## 2020-09-29 DIAGNOSIS — R531 Weakness: Secondary | ICD-10-CM

## 2020-09-29 DIAGNOSIS — E860 Dehydration: Secondary | ICD-10-CM | POA: Diagnosis present

## 2020-09-29 DIAGNOSIS — I252 Old myocardial infarction: Secondary | ICD-10-CM | POA: Diagnosis not present

## 2020-09-29 DIAGNOSIS — J9601 Acute respiratory failure with hypoxia: Secondary | ICD-10-CM | POA: Diagnosis present

## 2020-09-29 DIAGNOSIS — Z7901 Long term (current) use of anticoagulants: Secondary | ICD-10-CM

## 2020-09-29 DIAGNOSIS — F419 Anxiety disorder, unspecified: Secondary | ICD-10-CM | POA: Diagnosis present

## 2020-09-29 DIAGNOSIS — R1031 Right lower quadrant pain: Secondary | ICD-10-CM | POA: Diagnosis not present

## 2020-09-29 DIAGNOSIS — Z888 Allergy status to other drugs, medicaments and biological substances status: Secondary | ICD-10-CM

## 2020-09-29 DIAGNOSIS — Z96651 Presence of right artificial knee joint: Secondary | ICD-10-CM | POA: Diagnosis present

## 2020-09-29 DIAGNOSIS — I4891 Unspecified atrial fibrillation: Secondary | ICD-10-CM | POA: Diagnosis not present

## 2020-09-29 DIAGNOSIS — J189 Pneumonia, unspecified organism: Secondary | ICD-10-CM

## 2020-09-29 DIAGNOSIS — D75839 Thrombocytosis, unspecified: Secondary | ICD-10-CM | POA: Diagnosis present

## 2020-09-29 DIAGNOSIS — R197 Diarrhea, unspecified: Secondary | ICD-10-CM | POA: Diagnosis present

## 2020-09-29 DIAGNOSIS — Z6828 Body mass index (BMI) 28.0-28.9, adult: Secondary | ICD-10-CM

## 2020-09-29 DIAGNOSIS — Z9049 Acquired absence of other specified parts of digestive tract: Secondary | ICD-10-CM

## 2020-09-29 LAB — HEPATIC FUNCTION PANEL
ALT: 23 U/L (ref 0–44)
AST: 36 U/L (ref 15–41)
Albumin: 3.4 g/dL — ABNORMAL LOW (ref 3.5–5.0)
Alkaline Phosphatase: 89 U/L (ref 38–126)
Bilirubin, Direct: 0.1 mg/dL (ref 0.0–0.2)
Indirect Bilirubin: 0.4 mg/dL (ref 0.3–0.9)
Total Bilirubin: 0.5 mg/dL (ref 0.3–1.2)
Total Protein: 7.5 g/dL (ref 6.5–8.1)

## 2020-09-29 LAB — CBC
HCT: 41.1 % (ref 36.0–46.0)
Hemoglobin: 12.2 g/dL (ref 12.0–15.0)
MCH: 24.2 pg — ABNORMAL LOW (ref 26.0–34.0)
MCHC: 29.7 g/dL — ABNORMAL LOW (ref 30.0–36.0)
MCV: 81.5 fL (ref 80.0–100.0)
Platelets: 465 10*3/uL — ABNORMAL HIGH (ref 150–400)
RBC: 5.04 MIL/uL (ref 3.87–5.11)
RDW: 21.7 % — ABNORMAL HIGH (ref 11.5–15.5)
WBC: 9.9 10*3/uL (ref 4.0–10.5)
nRBC: 0 % (ref 0.0–0.2)

## 2020-09-29 LAB — FERRITIN: Ferritin: 61 ng/mL (ref 11–307)

## 2020-09-29 LAB — RESP PANEL BY RT-PCR (FLU A&B, COVID) ARPGX2
Influenza A by PCR: NEGATIVE
Influenza B by PCR: NEGATIVE
SARS Coronavirus 2 by RT PCR: POSITIVE — AB

## 2020-09-29 LAB — BASIC METABOLIC PANEL
Anion gap: 16 — ABNORMAL HIGH (ref 5–15)
BUN: 39 mg/dL — ABNORMAL HIGH (ref 8–23)
CO2: 16 mmol/L — ABNORMAL LOW (ref 22–32)
Calcium: 8.7 mg/dL — ABNORMAL LOW (ref 8.9–10.3)
Chloride: 100 mmol/L (ref 98–111)
Creatinine, Ser: 1.96 mg/dL — ABNORMAL HIGH (ref 0.44–1.00)
GFR, Estimated: 25 mL/min — ABNORMAL LOW (ref >60)
Glucose, Bld: 187 mg/dL — ABNORMAL HIGH (ref 70–99)
Potassium: 4.4 mmol/L (ref 3.5–5.1)
Sodium: 132 mmol/L — ABNORMAL LOW (ref 135–145)

## 2020-09-29 LAB — TRIGLYCERIDES: Triglycerides: 84 mg/dL (ref <150)

## 2020-09-29 LAB — LACTATE DEHYDROGENASE: LDH: 218 U/L — ABNORMAL HIGH (ref 98–192)

## 2020-09-29 LAB — D-DIMER, QUANTITATIVE: D-Dimer, Quant: 0.53 ug/mL-FEU — ABNORMAL HIGH (ref 0.00–0.50)

## 2020-09-29 LAB — TSH: TSH: 6.142 u[IU]/mL — ABNORMAL HIGH (ref 0.350–4.500)

## 2020-09-29 LAB — FIBRINOGEN: Fibrinogen: 536 mg/dL — ABNORMAL HIGH (ref 210–475)

## 2020-09-29 LAB — TROPONIN I (HIGH SENSITIVITY)
Troponin I (High Sensitivity): 13 ng/L (ref <18)
Troponin I (High Sensitivity): 13 ng/L (ref <18)

## 2020-09-29 LAB — C-REACTIVE PROTEIN: CRP: 3.9 mg/dL — ABNORMAL HIGH (ref <1.0)

## 2020-09-29 LAB — LACTIC ACID, PLASMA: Lactic Acid, Venous: 1.9 mmol/L (ref 0.5–1.9)

## 2020-09-29 LAB — PROCALCITONIN: Procalcitonin: 0.42 ng/mL

## 2020-09-29 MED ORDER — ONDANSETRON HCL 4 MG PO TABS: 4.0000 mg | ORAL_TABLET | Freq: Four times a day (QID) | ORAL | Status: DC | PRN

## 2020-09-29 MED ORDER — SODIUM CHLORIDE 0.9 % IV SOLN
100.0000 mg | INTRAVENOUS | Status: AC
  Administered 2020-09-29 – 2020-09-30 (×2): 100 mg via INTRAVENOUS

## 2020-09-29 MED ORDER — LACTATED RINGERS IV SOLN: INTRAVENOUS | Status: DC

## 2020-09-29 MED ORDER — SODIUM CHLORIDE 0.9 % IV SOLN
100.0000 mg | Freq: Every day | INTRAVENOUS | Status: AC
  Administered 2020-09-30 – 2020-10-01 (×2): 100 mg via INTRAVENOUS
  Filled 2020-09-29 (×3): qty 20

## 2020-09-29 MED ORDER — ZINC SULFATE 220 (50 ZN) MG PO CAPS
220.0000 mg | ORAL_CAPSULE | Freq: Every day | ORAL | Status: DC
  Administered 2020-09-30 – 2020-10-01 (×2): 220 mg via ORAL
  Filled 2020-09-29 (×2): qty 1

## 2020-09-29 MED ORDER — DILTIAZEM HCL-DEXTROSE 125-5 MG/125ML-% IV SOLN (PREMIX)
5.0000 mg/h | INTRAVENOUS | Status: DC
  Administered 2020-09-29 – 2020-09-30 (×2): 5 mg/h via INTRAVENOUS
  Filled 2020-09-29 (×2): qty 125

## 2020-09-29 MED ORDER — ASCORBIC ACID 500 MG PO TABS
500.0000 mg | ORAL_TABLET | Freq: Every day | ORAL | Status: DC
  Administered 2020-09-30 – 2020-10-01 (×2): 500 mg via ORAL
  Filled 2020-09-29 (×2): qty 1

## 2020-09-29 MED ORDER — ENOXAPARIN SODIUM 30 MG/0.3ML IJ SOSY
30.0000 mg | PREFILLED_SYRINGE | INTRAMUSCULAR | Status: DC
  Filled 2020-09-29: qty 0.3

## 2020-09-29 MED ORDER — ACETAMINOPHEN 325 MG PO TABS
650.0000 mg | ORAL_TABLET | Freq: Four times a day (QID) | ORAL | Status: DC | PRN
  Administered 2020-09-30 – 2020-10-01 (×2): 650 mg via ORAL
  Filled 2020-09-29 (×2): qty 2

## 2020-09-29 MED ORDER — DILTIAZEM LOAD VIA INFUSION
10.0000 mg | Freq: Once | INTRAVENOUS | Status: AC
  Administered 2020-09-29: 10 mg via INTRAVENOUS
  Filled 2020-09-29: qty 10

## 2020-09-29 MED ORDER — ONDANSETRON HCL 4 MG/2ML IJ SOLN: 4.0000 mg | Freq: Four times a day (QID) | INTRAMUSCULAR | Status: DC | PRN

## 2020-09-29 NOTE — Progress Notes (Signed)
Pt has 3 wounds to her coccyx on admission. Edges are blanchable. 4x6, 4x1.5 & 1x0.5; cleansed and foam dressing applied.

## 2020-09-29 NOTE — ED Provider Notes (Signed)
Emergency Medicine Provider Triage Evaluation Note  [name] , a 87 y.o. female  was evaluated in triage.  Pt complains of chest pain over the last 3 days, has associate shortness of breath with it.  Patient states she is feels like her typical chest pain flareups.  She denies any relieving factors..  Review of Systems  Positive: Chest pain shortness of breath Negative: Fevers, chills  Physical Exam  BP 101/80 (BP Location: Right Arm)   Pulse (!) 103   Temp 97.9 F (36.6 C) (Oral)   Resp 20   Ht 5\' 1"  (1.549 m)   Wt 68 kg   SpO2 99%   BMI 28.34 kg/m  Gen:   Awake, no distress   Resp:  Normal effort  MSK:   Moves extremities without difficulty  Other:    Medical Decision Making  Medically screening exam initiated at 5:09 PM.  Appropriate orders placed.  [name] was informed that the remainder of the evaluation will be completed by another provider, this initial triage assessment does not replace that evaluation, and the importance of remaining in the ED until their evaluation is complete.  Presents with chest pain, shortness of breath, lab work imaging been ordered, patient will need further work-up in the emergency department.   Marcello Fennel, PA-C 09/29/20 1709    Margette Fast, MD 10/02/20 1101

## 2020-09-29 NOTE — ED Provider Notes (Signed)
Emergency Department Provider Note   I have reviewed the triage vital signs and the nursing notes.   HISTORY  Chief Complaint Chest Pain   HPI [name] is a 87 y.o. female past medical history reviewed below including atrial fibrillation on anticoagulation presents with sharp chest pain with some shortness of breath and fatigue.  Symptoms have been ongoing for the past 4 days.  She states she feels like she is in A. fib but also is having some episodes of nonbloody diarrhea followed closely by constipation issues over the past several days.  She has felt generally weak and has not been eating or drinking much over the past 2 days.  She denies any fevers or shaking chills.  She remains compliant with her home medications.  She follows with Dr. Harl Bowie with cardiology and has seen Dr. Gala Romney in the past with dysphagia.  Denies any black or blood in the bowel movements.    Past Medical History:  Diagnosis Date   Anxiety    Arthritis    Atrial fibrillation (HCC)    Bursitis    Left shoulder   Cataract    CHF (congestive heart failure) (HCC)    CKD (chronic kidney disease)    stage 3-4   COPD (chronic obstructive pulmonary disease) (HCC)    Coronary atherosclerosis of native coronary artery    a. s/p DES to LCx in 04/2013 b. cath in 11/2015 showing patent stent with 20% prox-LAD and 80% ostial RCA stenosis for which medical management was recommended due to small artery size   Depression    Diastolic heart failure (HCC)    EF 55-60%   Dysphagia, unspecified(787.20)    Dyspnea    Dysrhythmia    Essential hypertension    GERD (gastroesophageal reflux disease)    Hx Schatzki's ring, multiple EGD/ED last 01/06/2004   Gout    Headache    History of anemia    Hyperlipidemia    Internal hemorrhoids without mention of complication    MI (myocardial infarction) ([place]) 2006   Microscopic colitis 2003   Panic disorder without agoraphobia    Paresthesia    Pneumonia 12/2011   PVD  (peripheral vascular disease) ([place])    S/P colonoscopy 09/27/2001   internal hemorrhoids, desc colon inflam polyp, SB BX-chronic duodenitis, colitis   Sleep apnea    Thyroid disease     Patient Active Problem List   Diagnosis Date Noted   Hypoalbuminemia 09/30/2020   Dehydration 09/30/2020   Peripheral neuropathy 09/30/2020   COVID-19 virus infection 09/29/2020   Dark stools 12/12/2019   Odynophagia 12/12/2019   Pressure injury of skin 11/09/2019   Unstable angina (HCC) 11/08/2019   Atrial fibrillation with rapid ventricular response ([place])    Hypothyroidism 09/19/2019   AKI (acute kidney injury) ([place]) 10/01/2018   Persistent atrial fibrillation 12/30/2017   Acute on chronic combined systolic and diastolic heart failure ([place]) 12/30/2017   Hyperammonemia ([place]) 02/20/2017   Myoclonic jerking 02/20/2017   AF (paroxysmal atrial fibrillation) ([place]) 02/19/2017   Acute gout 96/78/9381   Chronic systolic heart failure ([place])    Chest pain 09/30/2016   Status post lumbar spine surgery for decompression of spinal cord 08/07/2016   Lumbosacral spondylosis with radiculopathy 08/01/2016   Preoperative clearance 07/05/2016   Cough 05/26/2016   Generalized weakness 04/26/2016   Nausea without vomiting 04/26/2016   Atrial fibrillation with RVR ([place]) 04/15/2016   DOE (dyspnea on exertion) 03/20/2016   Acute on chronic diastolic congestive  heart failure (HCC)    Chronic kidney disease, stage III (moderate) ([place]) 02/17/2016   Essential hypertension 02/17/2016   COPD (chronic obstructive pulmonary disease) ([place]) 02/17/2016   Acute bronchitis 12/18/2015   CAD (coronary artery disease)    Chest pain at rest 10/15/2015   Chronic low back pain 10/15/2015   Hyponatremia 10/15/2015   Hyperglycemia 10/15/2015   Thrombocytosis 10/15/2015   Atypical chest pain 10/15/2015   Depression    Anxiety    Gastroesophageal reflux disease without esophagitis    Mild cognitive impairment 10/14/2015   Iron  deficiency anemia due to chronic blood loss    Meningitis 10/01/2015   Coronary artery disease due to lipid rich plaque    NSVT (nonsustained ventricular tachycardia) (HCC)    CSF leak 09/27/2015   Spondylolisthesis of lumbar region 09/24/2015   Gastric polyp    History of colonic polyps    Chronic diarrhea    Diarrhea 04/01/2015   Esophageal dysphagia 04/01/2015   PAOD (peripheral arterial occlusive disease) ([place]) 03/05/2015   Pain in the chest    Hyperlipidemia    Weight gain 08/12/2014   Anemia 08/07/2014   Hemorrhoids 08/06/2014   CHF (congestive heart failure) ([place]) 07/29/2014   Rectal bleeding 07/06/2014   Constipation 07/06/2014   Lower extremity edema 11/24/2013   Acute kidney injury superimposed on CKD ([place]) 11/24/2013   Hypokalemia 11/24/2013   Other and unspecified angina pectoris 05/14/2013   Hematochezia 02/13/2011   Abdominal pain 02/13/2011   Major depression ([place]) 09/28/2010   FATIGUE 04/13/2009   Chronic diastolic heart failure ([place]) 11/30/2008   DYSPNEA 11/30/2008   CAD, NATIVE VESSEL - PCI + DES to left circumflex 05/14/13 11/27/2008   Peripheral vascular disease ([place]) 11/27/2008   PANIC ATTACK 02/28/2008   MI 02/28/2008   Internal hemorrhoids 02/28/2008   COLITIS 02/28/2008   Dysphagia 02/28/2008    Past Surgical History:  Procedure Laterality Date   ABDOMINAL HYSTERECTOMY     ABDOMINAL HYSTERECTOMY     AGILE CAPSULE N/A 08/30/2020   Procedure: AGILE CAPSULE;  Surgeon: Daneil Dolin, MD;  [facility];  Service: Endoscopy;  Laterality: N/A;  7:30am   ANTERIOR AND POSTERIOR REPAIR     with resection of vagina   ANTERIOR LAT LUMBAR FUSION N/A 08/01/2016   Procedure: Lumbar Two-Lumbar Five Transpsoas lateral interbody fusion with Lumbar Two-Three lateral plate fixation;  Surgeon: Kevan Ny Ditty, MD;  Location: [place];  Service: Neurosurgery;  Laterality: N/A;  L2-5 Transpsoas lateral interbody fusion with L2-3 lateral plate fixation    APPENDECTOMY     BACK SURGERY     BIOPSY  07/05/2015   Procedure: BIOPSY;  Surgeon: Daneil Dolin, MD;  [facility];  Service: Endoscopy;;  gastric polyp biopsy, ascending colon biopsy   BIOPSY  02/16/2020   Procedure: BIOPSY;  Surgeon: Daneil Dolin, MD;  [facility];  Service: Endoscopy;;   BIOPSY  06/28/2020   Procedure: BIOPSY;  Surgeon: Daneil Dolin, MD;  [facility];  Service: Endoscopy;;   BLADDER SUSPENSION  11/09/2011   Procedure: TRANSVAGINAL TAPE (TVT) PROCEDURE;  Surgeon: Marissa Nestle, MD;  Location: [facility];  Service: Urology;  Laterality: N/A;   bladder tack  06/2010   BREAST LUMPECTOMY  1998   left, benign   CARDIAC CATHETERIZATION     CARDIAC CATHETERIZATION     CARDIAC CATHETERIZATION N/A 12/16/2015   Procedure: Left Heart Cath and Coronary Angiography;  Surgeon: Joyice Faster  Claiborne Billings, MD;  Location: [place] CV LAB;  Service: Cardiovascular;  Laterality: N/A;   CARDIOVERSION N/A 10/04/2017   Procedure: CARDIOVERSION;  Surgeon: Herminio Commons, MD;  Location: [facility];  Service: Cardiovascular;  Laterality: N/A;   CARDIOVERSION N/A 01/30/2018   Procedure: CARDIOVERSION;  Surgeon: Herminio Commons, MD;  [facility];  Service: Cardiovascular;  Laterality: N/A;   CARDIOVERSION N/A 11/10/2019   Procedure: CARDIOVERSION;  Surgeon: Josue Hector, MD;  Location: [facility] ENDOSCOPY;  Service: Cardiovascular;  Laterality: N/A;   [place]   left   cataract surgery     CHOLECYSTECTOMY  1998   Cholecystectomy     COLONOSCOPY  03/16/2011   multiple hyperplastic colon polyps, sigmoid diverticulosis, melanosis coli   COLONOSCOPY WITH PROPOFOL N/A 07/05/2015   RMR:one 5 mm polyp in descending colon   COLONOSCOPY WITH PROPOFOL N/A 06/28/2020   Procedure: COLONOSCOPY WITH PROPOFOL;  Surgeon: Daneil Dolin, MD;  [facility];  Service: Endoscopy;  Laterality: N/A;  am appt   CORONARY ANGIOGRAPHY N/A  05/16/2018   Procedure: CORONARY ANGIOGRAPHY (CATH LAB);  Surgeon: [place] Crome, MD;  Location: [place] CV LAB;  Service: Cardiovascular;  Laterality: N/A;   CORONARY ANGIOPLASTY WITH STENT PLACEMENT  2015   ESOPHAGEAL DILATION N/A 07/05/2015   Procedure: ESOPHAGEAL DILATION;  Surgeon: Daneil Dolin, MD;  [facility];  Service: Endoscopy;  Laterality: N/A;   ESOPHAGOGASTRODUODENOSCOPY (EGD) WITH PROPOFOL N/A 07/05/2015   WNU:[id number]   ESOPHAGOGASTRODUODENOSCOPY (EGD) WITH PROPOFOL N/A 02/16/2020   Procedure: ESOPHAGOGASTRODUODENOSCOPY (EGD) WITH PROPOFOL;  Surgeon: Daneil Dolin, MD;  Normal examined esophagus s/p dilation, erythematous mucosa in the stomach s/p biopsy, normal examined duodenum. Pathology with mild gastropathy, negative for H. Pylori.    JOINT REPLACEMENT Right 2007   right knee   left hand surgery     LEFT HEART CATHETERIZATION WITH CORONARY ANGIOGRAM N/A 05/14/2013   Procedure: LEFT HEART CATHETERIZATION WITH CORONARY ANGIOGRAM;  Surgeon: Blane Ohara, MD;  Location: [facility] CATH LAB;  Service: Cardiovascular;  Laterality: N/A;   left rotator cuff surgery     LUMBAR LAMINECTOMY/DECOMPRESSION MICRODISCECTOMY N/A 10/11/2012   Procedure: LUMBAR LAMINECTOMY/DECOMPRESSION MICRODISCECTOMY 2 LEVELS;  Surgeon: Floyce Stakes, MD;  Location: [place] NEURO ORS;  Service: Neurosurgery;  Laterality: N/A;  L3-4 L4-5 Laminectomy   LUMBAR WOUND DEBRIDEMENT N/A 09/27/2015   Procedure: Exploration of Lumbar Wound w/ Repair CSF Leak/Lumbar Drain Placement;  Surgeon: Leeroy Cha, MD;  Location: [place] NEURO ORS;  Service: Neurosurgery;  Laterality: N/A;   MALONEY DILATION  03/16/2011   Gastritis. No H.pylori on bx. 25F maloney dilation with disruption of  occult cevical esophageal web   MALONEY DILATION N/A 02/16/2020   Procedure: MALONEY DILATION;  Surgeon: Daneil Dolin, MD;  [facility];  Service: Endoscopy;  Laterality: N/A;   NASAL SINUS SURGERY     right knee  replacement  2007   right leg benign tumor     SHOULDER SURGERY Left    TEE WITHOUT CARDIOVERSION N/A 10/04/2017   Procedure: TRANSESOPHAGEAL ECHOCARDIOGRAM (TEE) WITH PROPOFOL;  Surgeon: Herminio Commons, MD;  [facility];  Service: Cardiovascular;  Laterality: N/A;   TEE WITHOUT CARDIOVERSION N/A 11/10/2019   Procedure: TRANSESOPHAGEAL ECHOCARDIOGRAM (TEE);  Surgeon: Josue Hector, MD;  Location: [facility] ENDOSCOPY;  Service: Cardiovascular;  Laterality: N/A;   TONSILLECTOMY     unspecified area, hysterectomy  1972   partial    Allergies Cephalosporins,  Levaquin [levofloxacin in d5w], Macrodantin [nitrofurantoin macrocrystal], Phenothiazines, Polysorbate, Prednisone, Buspirone, Cardura [doxazosin mesylate], Codeine, Acyclovir and related, Colcrys [colchicine], Prochlorperazine, Ranexa [ranolazine], Atorvastatin, Colestipol, Ofloxacin, Other, Penicillins, and Pimozide  Family History  Problem Relation Age of Onset   Stroke Mother    Parkinson's disease Father    Coronary artery disease Other        family Hx-sons   Cancer Other    Stroke Other        family Hx   Hypertension Other        family Hx   Diabetes Brother    Heart disease Son        before age 89   Diabetes Son    Stroke Daughter 42   Colon cancer Grandson        diagnosed 71   Inflammatory bowel disease Neg Hx     Social History Social History   Tobacco Use   Smoking status: Former    Packs/day: 1.00    Years: 64.00    Pack years: 64.00    Types: Cigarettes    Start date: 12/24/1947    Quit date: 11/17/2001    Years since quitting: 18.8   Smokeless tobacco: Never   Tobacco comments:    Quit smoking in 2003  Vaping Use   Vaping Use: Never used  Substance Use Topics   Alcohol use: Not Currently   Drug use: No    Review of Systems  Constitutional: No fever/chills. Positive fatigue.  Eyes: No visual changes. ENT: No sore throat. Cardiovascular: Positive chest pain. Respiratory: Positive  shortness of breath. Gastrointestinal: No abdominal pain.  No nausea, no vomiting. Positive diarrhea (resolved). Positive constipation (current). Genitourinary: Positive for dysuria. Musculoskeletal: Negative for back pain. Skin: Negative for rash. Neurological: Negative for headaches, focal weakness or numbness.  10-point ROS otherwise negative.  ____________________________________________   PHYSICAL EXAM:  VITAL SIGNS: ED Triage Vitals  Enc Vitals Group     BP 09/29/20 1555 101/80     Pulse Rate 09/29/20 1555 75     Resp 09/29/20 1555 18     Temp 09/29/20 1555 97.9 F (36.6 C)     Temp Source 09/29/20 1555 Oral     SpO2 09/29/20 1555 100 %     Weight 09/29/20 1556 150 lb (68 kg)     Height 09/29/20 1556 5\' 1"  (1.549 m)   Constitutional: Alert and oriented. Well appearing and in no acute distress. Eyes: Conjunctivae are normal.  Head: Atraumatic. Nose: No congestion/rhinnorhea. Mouth/Throat: Mucous membranes are dry.  Neck: No stridor.   Cardiovascular: A-fib with RVR. Good peripheral circulation. Grossly normal heart sounds.   Respiratory: Normal respiratory effort.  No retractions. Lungs CTAB. Gastrointestinal: Soft with mild lower abdominal tenderness worse in the LLQ. No rebound or guarding. No distention.  Musculoskeletal: No lower extremity tenderness with trace edema bilaterally. No gross deformities of extremities. Neurologic:  Normal speech and language. Skin:  Skin is warm, dry and intact. No rash noted.   ____________________________________________   LABS (all labs ordered are listed, but only abnormal results are displayed)  Labs Reviewed  RESP PANEL BY RT-PCR (FLU A&B, COVID) ARPGX2 - Abnormal; Notable for the following components:      Result Value   SARS Coronavirus 2 by RT PCR POSITIVE (*)    All other components within normal limits  BASIC METABOLIC PANEL - Abnormal; Notable for the following components:   Sodium 132 (*)    CO2 16 (*)  Glucose, Bld 187 (*)    BUN 39 (*)    Creatinine, Ser 1.96 (*)    Calcium 8.7 (*)    GFR, Estimated 25 (*)    Anion gap 16 (*)    All other components within normal limits  CBC - Abnormal; Notable for the following components:   MCH 24.2 (*)    MCHC 29.7 (*)    RDW 21.7 (*)    Platelets 465 (*)    All other components within normal limits  TSH - Abnormal; Notable for the following components:   TSH 6.142 (*)    All other components within normal limits  HEPATIC FUNCTION PANEL - Abnormal; Notable for the following components:   Albumin 3.4 (*)    All other components within normal limits  D-DIMER, QUANTITATIVE - Abnormal; Notable for the following components:   D-Dimer, Quant 0.53 (*)    All other components within normal limits  LACTATE DEHYDROGENASE - Abnormal; Notable for the following components:   LDH 218 (*)    All other components within normal limits  FIBRINOGEN - Abnormal; Notable for the following components:   Fibrinogen 536 (*)    All other components within normal limits  C-REACTIVE PROTEIN - Abnormal; Notable for the following components:   CRP 3.9 (*)    All other components within normal limits  CBC WITH DIFFERENTIAL/PLATELET - Abnormal; Notable for the following components:   Hemoglobin 9.6 (*)    HCT 31.0 (*)    MCV 78.3 (*)    MCH 24.2 (*)    RDW 20.8 (*)    Abs Immature Granulocytes 0.16 (*)    All other components within normal limits  COMPREHENSIVE METABOLIC PANEL - Abnormal; Notable for the following components:   Sodium 132 (*)    Chloride 97 (*)    BUN 42 (*)    Creatinine, Ser 1.87 (*)    Calcium 8.0 (*)    Total Protein 6.0 (*)    Albumin 2.8 (*)    GFR, Estimated 26 (*)    All other components within normal limits  C-REACTIVE PROTEIN - Abnormal; Notable for the following components:   CRP 3.8 (*)    All other components within normal limits  PHOSPHORUS - Abnormal; Notable for the following components:   Phosphorus 4.8 (*)    All other  components within normal limits  HEMOGLOBIN A1C - Abnormal; Notable for the following components:   Hgb A1c MFr Bld 7.1 (*)    All other components within normal limits  CBC WITH DIFFERENTIAL/PLATELET - Abnormal; Notable for the following components:   Hemoglobin 9.6 (*)    HCT 31.5 (*)    MCV 78.9 (*)    MCH 24.1 (*)    RDW 20.8 (*)    Abs Immature Granulocytes 0.10 (*)    All other components within normal limits  COMPREHENSIVE METABOLIC PANEL - Abnormal; Notable for the following components:   Sodium 134 (*)    Glucose, Bld 115 (*)    BUN 33 (*)    Creatinine, Ser 1.36 (*)    Calcium 8.1 (*)    Total Protein 6.1 (*)    Albumin 2.8 (*)    Total Bilirubin 0.2 (*)    GFR, Estimated 38 (*)    All other components within normal limits  C-REACTIVE PROTEIN - Abnormal; Notable for the following components:   CRP 3.9 (*)    All other components within normal limits  D-DIMER, QUANTITATIVE - Abnormal; Notable for the  following components:   D-Dimer, Quant 0.61 (*)    All other components within normal limits  CULTURE, BLOOD (ROUTINE X 2)  CULTURE, BLOOD (ROUTINE X 2)  MRSA NEXT GEN BY PCR, NASAL  URINE CULTURE  LACTIC ACID, PLASMA  LACTIC ACID, PLASMA  PROCALCITONIN  FERRITIN  TRIGLYCERIDES  D-DIMER, QUANTITATIVE  FERRITIN  MAGNESIUM  FERRITIN  MAGNESIUM  PHOSPHORUS  TROPONIN I (HIGH SENSITIVITY)  TROPONIN I (HIGH SENSITIVITY)   ____________________________________________  EKG   EKG Interpretation  Date/Time:  Wednesday September 29 2020 15:59:38 EDT Ventricular Rate:  132 PR Interval:    QRS Duration: 70 QT Interval:  300 QTC Calculation: 444 R Axis:   -4 Text Interpretation: Atrial fibrillation with rapid ventricular response Low voltage QRS Cannot rule out Anterior infarct , age undetermined Abnormal ECG Confirmed by Nanda Quinton [phone]) on 09/29/2020 4:05:27 PM         ____________________________________________  RADIOLOGY  DG Chest 2 View  Result  Date: 09/29/2020 CLINICAL DATA:  Chest pain.  Rule out CHF EXAM: CHEST - 2 VIEW COMPARISON:  11/08/2019 FINDINGS: Normal heart size. No pleural effusion or edema. Chronic diffuse reticular interstitial markings are unchanged from previous exam. No superimposed airspace consolidation. IMPRESSION: 1. Stable chronic interstitial lung disease. 2. No signs of CHF. Electronically Signed   By: Kerby Moors M.D.   On: 09/29/2020 16:31    ____________________________________________   PROCEDURES  Procedure(s) performed:   Procedures  CRITICAL CARE Performed by: Margette Fast Total critical care time: 35 minutes Critical care time was exclusive of separately billable procedures and treating other patients. Critical care was necessary to treat or prevent imminent or life-threatening deterioration. Critical care was time spent personally by me on the following activities: development of treatment plan with patient and/or surrogate as well as nursing, discussions with consultants, evaluation of patient's response to treatment, examination of patient, obtaining history from patient or surrogate, ordering and performing treatments and interventions, ordering and review of laboratory studies, ordering and review of radiographic studies, pulse oximetry and re-evaluation of patient's condition.  Nanda Quinton, MD Emergency Medicine  ____________________________________________   INITIAL IMPRESSION / ASSESSMENT AND PLAN / ED COURSE  Pertinent labs & imaging results that were available during my care of the patient were reviewed by me and considered in my medical decision making (see chart for details).   Patient presents the emergency department with atypical chest pain, fatigue, shortness of breath.  She has had intermittent diarrhea/constipation episodes as well as some tenderness in the left lower quadrant on my exam.  Some mild urinary tract infection symptoms as well.  It is not clear to me that A. fib  is her primary issue.  Question if this could be reactive to some underlying dehydration, developing infection.  Will need CT imaging to rule out diverticulitis with left lower quadrant abdominal pain.  Plan for UA as well.  Patient is clinically dehydrated and has not been eating or drinking very much over the past several days.  She does not seem a good candidate for cardioversion and discharge from the emergency department with other issues ongoing.  She has been cardioverted in the past with return to A-fib intermittently since.   08:30 PM  Heart rate well controlled here on diltiazem infusion.  She is on home diltiazem with her CHF as well as metoprolol.  She is COVID-positive here.  She is not vaccinated and feeling very weak, not eating, having some intermittent diarrhea.  Feel she would benefit  from observation overnight for continued rate control, transition back to PO medications, and ensure she does not decompensate from a respiratory status.  She does not have pneumonia on her chest x-ray.  Does have some mention of bronchiectasis in the lung bases but no obvious multifocal pneumonia mentioned.   Discussed patient's case with [facility] to request admission. Patient and family (if present) updated with plan. Care transferred to [facility] service.  I reviewed all nursing notes, vitals, pertinent old records, EKGs, labs, imaging (as available).  ____________________________________________  FINAL CLINICAL IMPRESSION(S) / ED DIAGNOSES  Final diagnoses:  Acute respiratory failure with hypoxia (HCC)  Multifocal pneumonia     MEDICATIONS GIVEN DURING THIS VISIT:  Medications  diltiazem (CARDIZEM) 1 mg/mL load via infusion 10 mg (10 mg Intravenous Bolus from Bag 09/29/20 1841)  remdesivir 100 mg in sodium chloride 0.9 % 100 mL IVPB (0 mg Intravenous Stopped 09/30/20 0030)  remdesivir 100 mg in sodium chloride 0.9 % 100 mL IVPB (0 mg Intravenous Stopped 10/01/20 1129)  diltiazem (CARDIZEM) tablet 30 mg  (30 mg Oral Given 09/30/20 1806)     NEW OUTPATIENT MEDICATIONS STARTED DURING THIS VISIT:  Discharge Medication List as of 10/01/2020  2:38 PM     START taking these medications   Details  guaiFENesin (MUCINEX) 600 MG 12 hr tablet Take 1 tablet (600 mg total) by mouth 2 (two) times daily for 10 days., Starting Fri 10/01/2020, Until Mon 10/11/2020, Normal    ascorbic acid (VITAMIN C) 500 MG tablet Take 1 tablet (500 mg total) by mouth daily., Starting Sat 10/02/2020, Normal    guaiFENesin-dextromethorphan (ROBITUSSIN DM) 100-10 MG/5ML syrup Take 5 mLs by mouth every 4 (four) hours as needed for cough (chest congestion)., Starting Fri 10/01/2020, Normal    zinc sulfate 220 (50 Zn) MG capsule Take 1 capsule (220 mg total) by mouth daily., Starting Sat 10/02/2020, Normal        Note:  This document was prepared using Dragon voice recognition software and may include unintentional dictation errors.  Nanda Quinton, MD, [facility] Emergency Medicine    Padme Arriaga, [facility], MD 10/02/20 1101

## 2020-09-29 NOTE — H&P (Addendum)
History and Physical  [name] [name] DOB: Aug 19, 1932 DOA: 09/29/2020  Referring physician: Margette Fast, MD PCP: Rosalee Kaufman, PA-C Patient coming from: Home  Chief Complaint: Chest pain  HPI: [name] is an 87 y.o. female with medical history significant for atrial fibrillation on Eliquis, diastolic CHF, CAD s/p DES to LCx (04/2013), GERD, COPD who presents to the emergency department with complaints of reproducible sharp left-sided chest pain which has been going on for 4 days.  Patient has a history of afibrillation and she states that she has noted about 3 weeks of increased heart rate without producing any significant discomfort, however, within last 3-4 days, the abnormal rhythm appears to be faster and was associated with onset of the above described chest pain with associated shortness of breath.  Patient also complained of some episodes of nonbloody diarrhea which was followed by constipation within last several days.  She complained of increased weakness due to decreased oral intake (food and fluid) within last 2 days, however patient denies fever, chills, headache, nausea vomiting.  ED Course:  In the emergency department, she was tachypneic, otherwise she was hemodynamically stable.  In the ED showed normal CBC except for thrombocytosis, BMP shows hyponatremia, BUN to creatinine 39/1.96 (baseline creatinine at 1.1-1.3), hyperglycemia, D-dimer 0.53, fibrinogen 536, TSH 6.142, albumin 3.4, procalcitonin 0.42, CRP 3.9, lactic acid 1.9, ferritin 61.  Influenza A and B was negative.  SARS coronavirus 2 was positive. CT abdomen and pelvis without contrast showed no acute process demonstrated in the abdomen or pelvis. No evidence of bowel obstruction or inflammation Chest x-ray showed stable chronic interstitial lung disease and no signs of CHF Patient was started on IV remdesivir, Cardizem 10 mg x 1 was given.  Hospitalist was asked to admit patient for further  evaluation and management.  Review of Systems: Constitutional: Negative for chills and fever.  HENT: Negative for ear pain and sore throat.   Eyes: Negative for pain and visual disturbance.  Respiratory: Positive for shortness of breath.  Negative for cough   Cardiovascular: Positive for chest pain and negative for palpitations.  Gastrointestinal: Negative for abdominal pain and vomiting.  Endocrine: Negative for polyphagia and polyuria.  Genitourinary: Negative for decreased urine volume, dysuria, enuresis Musculoskeletal: Negative for arthralgias and back pain.  Skin: Negative for color change and rash.  Allergic/Immunologic: Negative for immunocompromised state.  Neurological: Positive for decreased sensation in toes.  Negative for tremors, syncope, speech difficulty, weakness, light-headedness and headaches.  Hematological: Does not bruise/bleed easily.  All other systems reviewed and are negative  Past Medical History:  Diagnosis Date   Anxiety    Arthritis    Atrial fibrillation (HCC)    Bursitis    Left shoulder   Cataract    CHF (congestive heart failure) (HCC)    CKD (chronic kidney disease)    stage 3-4   COPD (chronic obstructive pulmonary disease) (HCC)    Coronary atherosclerosis of native coronary artery    a. s/p DES to LCx in 04/2013 b. cath in 11/2015 showing patent stent with 20% prox-LAD and 80% ostial RCA stenosis for which medical management was recommended due to small artery size   Depression    Diastolic heart failure (HCC)    EF 55-60%   Dysphagia, unspecified(787.20)    Dyspnea    Dysrhythmia    Essential hypertension    GERD (gastroesophageal reflux disease)    Hx Schatzki's ring, multiple EGD/ED last 01/06/2004   Gout  Headache    History of anemia    Hyperlipidemia    Internal hemorrhoids without mention of complication    MI (myocardial infarction) ([place]) 2006   Microscopic colitis 2003   Panic disorder without agoraphobia    Paresthesia     Pneumonia 12/2011   PVD (peripheral vascular disease) (HCC)    S/P colonoscopy 09/27/2001   internal hemorrhoids, desc colon inflam polyp, SB BX-chronic duodenitis, colitis   Sleep apnea    Thyroid disease    Past Surgical History:  Procedure Laterality Date   ABDOMINAL HYSTERECTOMY     ABDOMINAL HYSTERECTOMY     AGILE CAPSULE N/A 08/30/2020   Procedure: AGILE CAPSULE;  Surgeon: Daneil Dolin, MD;  [facility];  Service: Endoscopy;  Laterality: N/A;  7:30am   ANTERIOR AND POSTERIOR REPAIR     with resection of vagina   ANTERIOR LAT LUMBAR FUSION N/A 08/01/2016   Procedure: Lumbar Two-Lumbar Five Transpsoas lateral interbody fusion with Lumbar Two-Three lateral plate fixation;  Surgeon: Kevan Ny Ditty, MD;  Location: [place];  Service: Neurosurgery;  Laterality: N/A;  L2-5 Transpsoas lateral interbody fusion with L2-3 lateral plate fixation   APPENDECTOMY     BACK SURGERY     BIOPSY  07/05/2015   Procedure: BIOPSY;  Surgeon: Daneil Dolin, MD;  [facility];  Service: Endoscopy;;  gastric polyp biopsy, ascending colon biopsy   BIOPSY  02/16/2020   Procedure: BIOPSY;  Surgeon: Daneil Dolin, MD;  [facility];  Service: Endoscopy;;   BIOPSY  06/28/2020   Procedure: BIOPSY;  Surgeon: Daneil Dolin, MD;  [facility];  Service: Endoscopy;;   BLADDER SUSPENSION  11/09/2011   Procedure: TRANSVAGINAL TAPE (TVT) PROCEDURE;  Surgeon: Marissa Nestle, MD;  Location: [facility];  Service: Urology;  Laterality: N/A;   bladder tack  06/2010   BREAST LUMPECTOMY  1998   left, benign   CARDIAC CATHETERIZATION     CARDIAC CATHETERIZATION     CARDIAC CATHETERIZATION N/A 12/16/2015   Procedure: Left Heart Cath and Coronary Angiography;  Surgeon: Troy Sine, MD;  Location: [place] [facility];  Service: Cardiovascular;  Laterality: N/A;   CARDIOVERSION N/A 10/04/2017   Procedure: CARDIOVERSION;  Surgeon: Herminio Commons, MD;  Location: [facility];   Service: Cardiovascular;  Laterality: N/A;   CARDIOVERSION N/A 01/30/2018   Procedure: CARDIOVERSION;  Surgeon: Herminio Commons, MD;  [facility];  Service: Cardiovascular;  Laterality: N/A;   CARDIOVERSION N/A 11/10/2019   Procedure: CARDIOVERSION;  Surgeon: Josue Hector, MD;  Location: [facility] ENDOSCOPY;  Service: Cardiovascular;  Laterality: N/A;   [place]   left   cataract surgery     CHOLECYSTECTOMY  1998   Cholecystectomy     COLONOSCOPY  03/16/2011   multiple hyperplastic colon polyps, sigmoid diverticulosis, melanosis coli   COLONOSCOPY WITH PROPOFOL N/A 07/05/2015   RMR:one 5 mm polyp in descending colon   COLONOSCOPY WITH PROPOFOL N/A 06/28/2020   Procedure: COLONOSCOPY WITH PROPOFOL;  Surgeon: Daneil Dolin, MD;  [facility];  Service: Endoscopy;  Laterality: N/A;  am appt   CORONARY ANGIOGRAPHY N/A 05/16/2018   Procedure: CORONARY ANGIOGRAPHY (CATH LAB);  Surgeon: Belva Crome, MD;  Location: [place] [facility];  Service: Cardiovascular;  Laterality: N/A;   CORONARY ANGIOPLASTY WITH STENT PLACEMENT  2015   ESOPHAGEAL DILATION N/A 07/05/2015   Procedure: ESOPHAGEAL DILATION;  Surgeon: Daneil Dolin,  MD;  [facility];  Service: Endoscopy;  Laterality: N/A;   ESOPHAGOGASTRODUODENOSCOPY (EGD) WITH PROPOFOL N/A 07/05/2015   LEX:[id number]   ESOPHAGOGASTRODUODENOSCOPY (EGD) WITH PROPOFOL N/A 02/16/2020   Procedure: ESOPHAGOGASTRODUODENOSCOPY (EGD) WITH PROPOFOL;  Surgeon: Daneil Dolin, MD;  Normal examined esophagus s/p dilation, erythematous mucosa in the stomach s/p biopsy, normal examined duodenum. Pathology with mild gastropathy, negative for H. Pylori.    JOINT REPLACEMENT Right 2007   right knee   left hand surgery     LEFT HEART CATHETERIZATION WITH CORONARY ANGIOGRAM N/A 05/14/2013   Procedure: LEFT HEART CATHETERIZATION WITH CORONARY ANGIOGRAM;  Surgeon: Blane Ohara, MD;  Location: [facility] CATH LAB;  Service:  Cardiovascular;  Laterality: N/A;   left rotator cuff surgery     LUMBAR LAMINECTOMY/DECOMPRESSION MICRODISCECTOMY N/A 10/11/2012   Procedure: LUMBAR LAMINECTOMY/DECOMPRESSION MICRODISCECTOMY 2 LEVELS;  Surgeon: Floyce Stakes, MD;  Location: [place] [facility];  Service: Neurosurgery;  Laterality: N/A;  L3-4 L4-5 Laminectomy   LUMBAR WOUND DEBRIDEMENT N/A 09/27/2015   Procedure: Exploration of Lumbar Wound w/ Repair CSF Leak/Lumbar Drain Placement;  Surgeon: Leeroy Cha, MD;  Location: [place] [facility];  Service: Neurosurgery;  Laterality: N/A;   MALONEY DILATION  03/16/2011   Gastritis. No H.pylori on bx. 23F maloney dilation with disruption of  occult cevical esophageal web   MALONEY DILATION N/A 02/16/2020   Procedure: MALONEY DILATION;  Surgeon: Daneil Dolin, MD;  [facility];  Service: Endoscopy;  Laterality: N/A;   NASAL SINUS SURGERY     right knee replacement  2007   right leg benign tumor     SHOULDER SURGERY Left    TEE WITHOUT CARDIOVERSION N/A 10/04/2017   Procedure: TRANSESOPHAGEAL ECHOCARDIOGRAM (TEE) WITH PROPOFOL;  Surgeon: Herminio Commons, MD;  Location: [facility];  Service: Cardiovascular;  Laterality: N/A;   TEE WITHOUT CARDIOVERSION N/A 11/10/2019   Procedure: TRANSESOPHAGEAL ECHOCARDIOGRAM (TEE);  Surgeon: Josue Hector, MD;  Location: [facility] ENDOSCOPY;  Service: Cardiovascular;  Laterality: N/A;   TONSILLECTOMY     unspecified area, hysterectomy  1972   partial    Social History:  reports that she quit smoking about 18 years ago. Her smoking use included cigarettes. She started smoking about 72 years ago. She has a 64.00 pack-year smoking history. She has never used smokeless tobacco. She reports previous alcohol use. She reports that she does not use drugs.   Allergies  Allergen Reactions   Cephalosporins Diarrhea and Nausea Only    Lightheaded   Levaquin [Levofloxacin In D5w] Swelling   Macrodantin [Nitrofurantoin Macrocrystal] Swelling   Phenothiazines  Anaphylaxis and Hives   Polysorbate Anaphylaxis   Prednisone Shortness Of Breath   Buspirone Itching   Cardura [Doxazosin Mesylate] Itching   Codeine Itching   Acyclovir And Related Itching    Redness of skin   Colcrys [Colchicine] Nausea Only    Upset stomach    Prochlorperazine Other (See Comments)    "Upset stomach"   Ranexa [Ranolazine]     Severe drop in BP   Atorvastatin Hives    Cramping; tolerates Crestor ok   Colestipol Palpitations   Ofloxacin Rash   Other Itching and Rash    "WOOL"= make skin look like it has been burned   Penicillins Other (See Comments)    Causes redness all over. Has patient had a PCN reaction causing immediate rash, facial/tongue/throat swelling, SOB or lightheadedness with hypotension: No Has patient had a PCN reaction causing severe rash involving mucus membranes or skin  necrosis: No Has patient had a PCN reaction that required hospitalization No Has patient had a PCN reaction occurring within the last 10 years: No If all of the above answers are "NO", then may proceed with Cephalosporin use.    Pimozide Hives and Itching    Family History  Problem Relation Age of Onset   Stroke Mother    Parkinson's disease Father    Coronary artery disease Other        family Hx-sons   Cancer Other    Stroke Other        family Hx   Hypertension Other        family Hx   Diabetes Brother    Heart disease Son        before age 38   Diabetes Son    Stroke Daughter 25   Colon cancer Grandson        diagnosed 67   Inflammatory bowel disease Neg Hx       Prior to Admission medications   Medication Sig Start Date End Date Taking? Authorizing Provider  acetaminophen (TYLENOL) 500 MG tablet Take 500 mg by mouth every 6 (six) hours as needed for headache or moderate pain.   Yes [provider]  ALPRAZolam Duanne Moron) 0.5 MG tablet Take 0.5 mg by mouth daily as needed for anxiety or sleep. 09/29/19  Yes [provider]  apixaban (ELIQUIS)  5 MG TABS tablet Take 1 tablet (5 mg total) by mouth 2 (two) times daily. 07/14/19  Yes Evans Lance, MD  Budeson-Glycopyrrol-Formoterol (BREZTRI AEROSPHERE) 160-9-4.8 MCG/ACT AERO Inhale 2 puffs into the lungs in the morning and at bedtime.   Yes [provider]  budesonide (ENTOCORT EC) 3 MG 24 hr capsule Take 3 capsules (9 mg total) by mouth daily. 06/30/20  Yes Rourk, Cristopher Estimable, MD  diltiazem (CARDIZEM CD) 120 MG 24 hr capsule TAKE ONE CAPSULE BY MOUTH DAILY Patient taking differently: Take 120 mg by mouth daily. 05/21/20  Yes Verta Ellen., NP  DULoxetine (CYMBALTA) 60 MG capsule Take 60 mg by mouth at bedtime.   Yes [provider]  fluticasone (CUTIVATE) 0.05 % cream Apply 1 application topically 2 (two) times daily as needed (skin tears). 07/15/19  Yes [provider]  furosemide (LASIX) 40 MG tablet TAKE 20 MG EVERY OTHER DAY ALTERNATING WITH 40 MG EVERY OTHER DAY 07/30/20  Yes Branch, Alphonse Guild, MD  gabapentin (NEURONTIN) 100 MG capsule Take 100 mg by mouth at bedtime. 09/24/20  Yes [provider]  hydrocortisone (ANUSOL-HC) 2.5 % rectal cream Place 1 application rectally 2 (two) times daily. 12/12/19  Yes Erenest Rasher, PA-C  isosorbide mononitrate (IMDUR) 60 MG 24 hr tablet Take 60 mg by mouth daily.   Yes [provider]  magnesium oxide (MAG-OX) 400 MG tablet TAKE ONE TABLET BY MOUTH DAILY. Patient taking differently: Take 400 mg by mouth daily. 10/21/19  Yes Verta Ellen., NP  metoprolol succinate (TOPROL-XL) 25 MG 24 hr tablet Take 75 mg by mouth daily.   Yes [provider]  Naphazoline HCl (CLEAR EYES OP) Place 1 drop into both eyes daily as needed (itching).   Yes [provider]  nitroGLYCERIN (NITROSTAT) 0.4 MG SL tablet Place 1 tablet (0.4 mg total) under the tongue every 5 (five) minutes as needed for chest pain. Reported on 08/04/2015 11/20/17  Yes Herminio Commons, MD  pantoprazole (PROTONIX) 40 MG  tablet TAKE ONE TABLET BY MOUTH TWICE DAILY  BEFORE MEALS 06/30/20  Yes Mahala Menghini, PA-C  potassium chloride (KLOR-CON) 10 MEQ tablet Take 1 tablet (10 mEq total) by mouth daily. 04/06/20  Yes Branch, Alphonse Guild, MD  budesonide (ENTOCORT EC) 3 MG 24 hr capsule After completing 8 weeks of budesonide 3 capsules (9 mg total) by mouth daily, decrease to 2 capsules (6 mg total) by mouth daily x2 weeks, then decrease to 1 capsule (3 mg total) by mouth daily x2 weeks, then stop. Patient not taking: No sig reported 08/09/20   Erenest Rasher, PA-C  ferrous sulfate 325 (65 FE) MG tablet Take 1 tablet (325 mg total) by mouth daily with breakfast. Patient not taking: No sig reported 08/09/20 08/09/21  Erenest Rasher, PA-C  Multiple Vitamins-Minerals (HAIR/SKIN/NAILS) CAPS Take 1 tablet by mouth daily. Patient not taking: Reported on 09/29/2020    [provider]  sulfamethoxazole-trimethoprim (BACTRIM DS) 800-160 MG tablet Take 1 tablet by mouth 2 (two) times daily. Patient not taking: Reported on 09/29/2020 08/09/20   Erenest Rasher, PA-C    Physical Exam: BP 101/67   Pulse 72   Temp 97.7 F (36.5 C) (Oral)   Resp (!) 23   Ht 5\' 1"  (1.549 m)   Wt 68 kg   SpO2 96%   BMI 28.34 kg/m   General: 87 y.o. year-old female well developed well nourished in no acute distress.  Alert and oriented x3. HEENT: Dry mucous membrane.  NCAT, EOMI Neck: Supple, trachea medial Cardiovascular: Regular rate and rhythm with no rubs or gallops.  No thyromegaly or JVD noted.  No lower extremity edema. 2/4 pulses in all 4 extremities. Respiratory: Clear to auscultation with no wheezes or rales. Good inspiratory effort. Abdomen: Soft, nontender nondistended with normal bowel sounds x4 quadrants. Muskuloskeletal: No cyanosis, clubbing or edema noted bilaterally Neuro: CN II-XII intact, strength 5/5 x 4, sensation, reflexes intact Skin: No ulcerative lesions noted or rashes Psychiatry: Mood is appropriate for  condition and setting          Labs on Admission:  Basic Metabolic Panel: Recent Labs  Lab 09/29/20 1634  NA 132*  K 4.4  CL 100  CO2 16*  GLUCOSE 187*  BUN 39*  CREATININE 1.96*  CALCIUM 8.7*   Liver Function Tests: Recent Labs  Lab 09/29/20 1811  AST 36  ALT 23  ALKPHOS 89  BILITOT 0.5  PROT 7.5  ALBUMIN 3.4*   No results for input(s): LIPASE, AMYLASE in the last 168 hours. No results for input(s): AMMONIA in the last 168 hours. CBC: Recent Labs  Lab 09/29/20 1634  WBC 9.9  HGB 12.2  HCT 41.1  MCV 81.5  PLT 465*   Cardiac Enzymes: No results for input(s): CKTOTAL, CKMB, CKMBINDEX, TROPONINI in the last 168 hours.  BNP (last 3 results) Recent Labs    11/09/19 1825  BNP 365.4*    ProBNP (last 3 results) No results for input(s): PROBNP in the last 8760 hours.  CBG: No results for input(s): GLUCAP in the last 168 hours.  Radiological Exams on Admission: CT ABDOMEN PELVIS WO CONTRAST  Result Date: 09/29/2020 CLINICAL DATA:  Left lower quadrant and right lower quadrant pain, nausea, vomiting, and diarrhea for 2 months. EXAM: CT ABDOMEN AND PELVIS WITHOUT CONTRAST TECHNIQUE: Multidetector CT imaging of the abdomen and pelvis was performed following the standard protocol without IV contrast. COMPARISON:  08/05/2020 FINDINGS: Lower chest: Mild interstitial fibrosis and mild bronchiectasis in the lung bases. Hepatobiliary: No focal liver abnormality is seen.  Status post cholecystectomy. No biliary dilatation. Pancreas: Unremarkable. No pancreatic ductal dilatation or surrounding inflammatory changes. Spleen: Normal in size without focal abnormality. Adrenals/Urinary Tract: No adrenal gland nodules. Calcifications in the right kidney likely representing vascular calcification. No obstructing stones identified. No hydronephrosis or hydroureter. Bladder is unremarkable. Stomach/Bowel: Stomach, small bowel, and colon are not abnormally distended. No wall thickening or  inflammatory changes are appreciated. Appendix is not identified. Vascular/Lymphatic: Aortic atherosclerosis. No enlarged abdominal or pelvic lymph nodes. Reproductive: Status post hysterectomy. No adnexal masses. Other: No abdominal wall hernia or abnormality. No abdominopelvic ascites. Musculoskeletal: Postoperative fixation of the lower lumbar spine. Degenerative changes in the spine. No destructive bone lesions. IMPRESSION: No acute process demonstrated in the abdomen or pelvis. No evidence of bowel obstruction or inflammation. Aortic atherosclerosis. Electronically Signed   By: Lucienne Capers M.D.   On: 09/29/2020 19:33   DG Chest 2 View  Result Date: 09/29/2020 CLINICAL DATA:  Chest pain.  Rule out CHF EXAM: CHEST - 2 VIEW COMPARISON:  11/08/2019 FINDINGS: Normal heart size. No pleural effusion or edema. Chronic diffuse reticular interstitial markings are unchanged from previous exam. No superimposed airspace consolidation. IMPRESSION: 1. Stable chronic interstitial lung disease. 2. No signs of CHF. Electronically Signed   By: Kerby Moors M.D.   On: 09/29/2020 16:31    EKG: I independently viewed the EKG done and my findings are as followed: A. fib with RVR at a rate of 102 bpm  Assessment/Plan Present on Admission:  COVID-19 virus infection  Atypical chest pain  Diarrhea  Thrombocytosis  Essential hypertension  Gastroesophageal reflux disease without esophagitis  Principal Problem:   AF (paroxysmal atrial fibrillation) (HCC) Active Problems:   Acute kidney injury superimposed on CKD (HCC)   CHF (congestive heart failure) (HCC)   Diarrhea   Hyponatremia   Thrombocytosis   Atypical chest pain   Gastroesophageal reflux disease without esophagitis   CAD (coronary artery disease)   Essential hypertension   COPD (chronic obstructive pulmonary disease) (HCC)   Generalized weakness   COVID-19 virus infection   Hypoalbuminemia   Dehydration   Peripheral neuropathy  Atypical  chest pain in the setting of paroxysmal atrial fibrillation EKG personally reviewed showed A. fib with RVR Chest pain was reproducible and atypical Troponin x2 was flat at 13 IV Cardizem 10 mg x 1 was given and patient was started on IV Cardizem drip. Continue telemetry and resume home meds patient transition off the Cardizem drip Continue home Eliquis   Acute diarrhea and generalized weakness in the setting of COVID-19 virus infection Patient has not had any diarrhea episodes since arrival to the ED SARS coronavirus 2 was positive Patient had no shortness of breath, no indication for breathing treatment or steroid at this time She was started on IV remdesivir, we shall continue with same at this time Continue vitamin-C 500 mg p.o. Daily Continue zinc 220 mg p.o. Daily Continue Tylenol p.r.n. for fever Continue contact and airborne isolation precaution Inflammatory markers: LDH: 218 CRP: 3.9 D-dimer: 0.53 Ferritin: 61 Continue monitoring daily inflammatory markers Physician PPE:  Surgical mask with face shield, N-95, nonsterile gloves, disposable gown, head and shoe covers Patient PPE:  Face mask   Hyponatremia Na 132, continue IV hydration  Thrombocytosis (chronic) Platelets 465, continue to monitor platelet levels  Acute kidney injury superimposed on CKD stage IV Dehydration BUN to creatinine 39/1.96 (baseline creatinine at 1.1-1.3) Continue IV hydration Renally adjust medications, avoid nephrotoxic agents/dehydration/hypotension  Hyperglycemia possibly due to diet controlled T2DM  CBG 187, A1c on 06/16/2020 was 7.0 A1c will be rechecked  Hypoalbuminemia possibly secondary to mild protein calorie malnutrition Albumin 3.4; protein supplement to be provided  Essential hypertension Continue IV Cardizem drip and transition to home meds when patient is off the drip  CAD Continue home meds  Diastolic CHF Echocardiogram done on 05/20/2020 showed LVEF of 60 to 65% Continue  home meds  GERD Continue home meds  COPD Continue home meds  Peripheral neuropathy Continue gabapentin   DVT prophylaxis: Eliquis  Code Status: Full code  Family Communication: None at bedside  Disposition Plan:   Patient is from:                        home Anticipated DC to:                   SNF or family members home Anticipated DC date:               2-3 days Anticipated DC barriers:          Patient requires inpatient management due to paroxysmal atrial fibrillation and COVID-19 virus infection  Consults called: None  Admission status: Inpatient    Bernadette Hoit MD [facility]  09/30/2020, 4:30 AM

## 2020-09-29 NOTE — ED Triage Notes (Signed)
Pt to er, pt states she is here for congestive heart failure, states that she is sob, states that it has been bothering her for the past 3 weeks, states that she also hemorrhoids and a rash on her bottom,

## 2020-09-29 NOTE — ED Notes (Signed)
Date and time results received: 09/29/20 2003 (use smartphrase ".now" to insert current time)  Test: covid Critical Value: positive  Name of Provider Notified: Dr Laverta Baltimore  Orders Received? Or Actions Taken?: Actions Taken: no orders received

## 2020-09-29 NOTE — ED Notes (Signed)
Report to brandy

## 2020-09-30 DIAGNOSIS — E860 Dehydration: Secondary | ICD-10-CM

## 2020-09-30 DIAGNOSIS — E8809 Other disorders of plasma-protein metabolism, not elsewhere classified: Secondary | ICD-10-CM

## 2020-09-30 DIAGNOSIS — G629 Polyneuropathy, unspecified: Secondary | ICD-10-CM

## 2020-09-30 LAB — CBC WITH DIFFERENTIAL/PLATELET
Abs Immature Granulocytes: 0.16 10*3/uL — ABNORMAL HIGH (ref 0.00–0.07)
Basophils Absolute: 0 10*3/uL (ref 0.0–0.1)
Basophils Relative: 1 %
Eosinophils Absolute: 0 10*3/uL (ref 0.0–0.5)
Eosinophils Relative: 0 %
HCT: 31 % — ABNORMAL LOW (ref 36.0–46.0)
Hemoglobin: 9.6 g/dL — ABNORMAL LOW (ref 12.0–15.0)
Immature Granulocytes: 2 %
Lymphocytes Relative: 38 %
Lymphs Abs: 2.5 10*3/uL (ref 0.7–4.0)
MCH: 24.2 pg — ABNORMAL LOW (ref 26.0–34.0)
MCHC: 31 g/dL (ref 30.0–36.0)
MCV: 78.3 fL — ABNORMAL LOW (ref 80.0–100.0)
Monocytes Absolute: 0.7 10*3/uL (ref 0.1–1.0)
Monocytes Relative: 11 %
Neutro Abs: 3.2 10*3/uL (ref 1.7–7.7)
Neutrophils Relative %: 48 %
Platelets: 351 10*3/uL (ref 150–400)
RBC: 3.96 MIL/uL (ref 3.87–5.11)
RDW: 20.8 % — ABNORMAL HIGH (ref 11.5–15.5)
WBC: 6.6 10*3/uL (ref 4.0–10.5)
nRBC: 0 % (ref 0.0–0.2)

## 2020-09-30 LAB — COMPREHENSIVE METABOLIC PANEL
ALT: 17 U/L (ref 0–44)
AST: 25 U/L (ref 15–41)
Albumin: 2.8 g/dL — ABNORMAL LOW (ref 3.5–5.0)
Alkaline Phosphatase: 69 U/L (ref 38–126)
Anion gap: 11 (ref 5–15)
BUN: 42 mg/dL — ABNORMAL HIGH (ref 8–23)
CO2: 24 mmol/L (ref 22–32)
Calcium: 8 mg/dL — ABNORMAL LOW (ref 8.9–10.3)
Chloride: 97 mmol/L — ABNORMAL LOW (ref 98–111)
Creatinine, Ser: 1.87 mg/dL — ABNORMAL HIGH (ref 0.44–1.00)
GFR, Estimated: 26 mL/min — ABNORMAL LOW (ref >60)
Glucose, Bld: 98 mg/dL (ref 70–99)
Potassium: 4.3 mmol/L (ref 3.5–5.1)
Sodium: 132 mmol/L — ABNORMAL LOW (ref 135–145)
Total Bilirubin: 0.3 mg/dL (ref 0.3–1.2)
Total Protein: 6 g/dL — ABNORMAL LOW (ref 6.5–8.1)

## 2020-09-30 LAB — PHOSPHORUS: Phosphorus: 4.8 mg/dL — ABNORMAL HIGH (ref 2.5–4.6)

## 2020-09-30 LAB — D-DIMER, QUANTITATIVE: D-Dimer, Quant: 0.36 ug/mL-FEU (ref 0.00–0.50)

## 2020-09-30 LAB — MAGNESIUM: Magnesium: 2.1 mg/dL (ref 1.7–2.4)

## 2020-09-30 LAB — MRSA NEXT GEN BY PCR, NASAL: MRSA by PCR Next Gen: NOT DETECTED

## 2020-09-30 LAB — LACTIC ACID, PLASMA: Lactic Acid, Venous: 1.9 mmol/L (ref 0.5–1.9)

## 2020-09-30 LAB — C-REACTIVE PROTEIN: CRP: 3.8 mg/dL — ABNORMAL HIGH (ref <1.0)

## 2020-09-30 LAB — FERRITIN: Ferritin: 50 ng/mL (ref 11–307)

## 2020-09-30 MED ORDER — METOPROLOL SUCCINATE ER 50 MG PO TB24
75.0000 mg | ORAL_TABLET | Freq: Every day | ORAL | Status: DC
  Administered 2020-09-30 – 2020-10-01 (×2): 75 mg via ORAL
  Filled 2020-09-30 (×2): qty 1

## 2020-09-30 MED ORDER — APIXABAN 2.5 MG PO TABS
2.5000 mg | ORAL_TABLET | Freq: Two times a day (BID) | ORAL | Status: DC
  Administered 2020-09-30 (×2): 2.5 mg via ORAL
  Filled 2020-09-30 (×2): qty 1

## 2020-09-30 MED ORDER — GLUCERNA SHAKE PO LIQD
237.0000 mL | Freq: Three times a day (TID) | ORAL | Status: DC
  Administered 2020-09-30 – 2020-10-01 (×3): 237 mL via ORAL

## 2020-09-30 MED ORDER — CHLORHEXIDINE GLUCONATE CLOTH 2 % EX PADS
6.0000 | MEDICATED_PAD | Freq: Every day | CUTANEOUS | Status: DC
  Administered 2020-09-30 – 2020-10-01 (×2): 6 via TOPICAL

## 2020-09-30 MED ORDER — DILTIAZEM HCL ER COATED BEADS 120 MG PO CP24
120.0000 mg | ORAL_CAPSULE | Freq: Every day | ORAL | Status: DC
  Administered 2020-09-30 – 2020-10-01 (×2): 120 mg via ORAL
  Filled 2020-09-30 (×2): qty 1

## 2020-09-30 MED ORDER — DILTIAZEM HCL 30 MG PO TABS
30.0000 mg | ORAL_TABLET | Freq: Once | ORAL | Status: AC
  Administered 2020-09-30: 30 mg via ORAL
  Filled 2020-09-30: qty 1

## 2020-09-30 MED ORDER — GUAIFENESIN-DM 100-10 MG/5ML PO SYRP
5.0000 mL | ORAL_SOLUTION | ORAL | Status: DC | PRN
  Administered 2020-09-30 – 2020-10-01 (×2): 5 mL via ORAL
  Filled 2020-09-30 (×2): qty 5

## 2020-09-30 MED ORDER — GABAPENTIN 100 MG PO CAPS
100.0000 mg | ORAL_CAPSULE | Freq: Every day | ORAL | Status: DC
  Administered 2020-09-30: 100 mg via ORAL
  Filled 2020-09-30: qty 1

## 2020-09-30 NOTE — Plan of Care (Signed)

## 2020-09-30 NOTE — Progress Notes (Signed)
Patient Demographics:    [name], is a 87 y.o. female, DOB - [birth year], MKL:[id number]  Admit date - 09/29/2020   Admitting Physician Bernadette Hoit, DO  Outpatient Primary MD for the patient is Rosine Door  LOS - 1   Chief Complaint  Patient presents with   Chest Pain        Subjective:    [name] today has no fevers, no emesis,  No Frank chest pain,   C/o fatigue and malaise, post-nasal drip and sinus issues, myalgia and mild cough    Assessment  & Plan :    Principal Problem:   AF (paroxysmal atrial fibrillation) (HCC) Active Problems:   Acute kidney injury superimposed on CKD (HCC)   CHF (congestive heart failure) (HCC)   Diarrhea   Hyponatremia   Thrombocytosis   Atypical chest pain   Gastroesophageal reflux disease without esophagitis   CAD (coronary artery disease)   Essential hypertension   COPD (chronic obstructive pulmonary disease) (HCC)   Generalized weakness   COVID-19 virus infection   Hypoalbuminemia   Dehydration   Peripheral neuropathy  Brief Summary:- 87 y.o. female with medical history significant for atrial fibrillation on Eliquis, diastolic CHF, CAD s/p DES to LCx (04/2013), GERD, COPD admitted on 09/29/20 with afib with RVR and Covid 19 infection  A/p 1)PAFib with RVR----improved on iv cardizem, will transition back to Toprol Xl and Cardizem- -2/3//2022-----EF is 60-65%  -Continue Eliquis for stroke prophylaxis  2) COVID-19 Resp infection- Unvaccinated -CT abd/pelvis and CXR w/o acute findings -No Hypoxia -No further diarrhea  --Check and trend inflammatory markers including D-dimer, ferritin and  CRP---also follow CBC and CMP -Treat with iv Remdesevir --Attempt to maintain euvolemic state --Zinc and vitamin C as ordered -Albuterol inhaler as needed  3)DM2- Use Novolog/Humalog Sliding scale insulin with Accu-Cheks/Fingersticks  as ordered   4)COPD--no acute exacerbation, continue bronchodilators especially in setting of COVID-19 infection  5)AKI----acute kidney injury on CKD stage -  IV -Patient received gentle IV hydration -renally adjust medications, avoid nephrotoxic agents / dehydration  / hypotension  6)CAD--- stable, continue metoprolol, no aspirin as patient is already on Eliquis,  7)Social/Ethics--plan of care discussed with patient and daughter, patient is a full code, daughter is hoping the patient will come and  live with her when patient leaves the hospital she may need some home health equipment  Disposition/Need for in-Hospital Stay- patient unable to be discharged at this time due to A. fib with RVR requiring IV Cardizem, AKI in the setting of diarrhea induced by COVID-19 infection requiring IV fluids pending better tolerance of oral intake  Status is: Inpatient  Remains inpatient appropriate because: Please see disposition above  Disposition: The patient is from: Home              Anticipated d/c is to: Home              Anticipated d/c date is: 1 day              Patient currently is not medically stable to d/c. Barriers: Not Clinically Stable-   Code Status :  -  Code Status: Full Code   Family Communication:   Discussed with daughter  Consults  :  na  DVT Prophylaxis  :   - SCDs   apixaban (ELIQUIS) tablet 2.5 mg Start: 09/30/20 1000 SCDs Start: 09/29/20 2249 apixaban (ELIQUIS) tablet 2.5 mg    Lab Results  Component Value Date   PLT 351 09/30/2020    Inpatient Medications  Scheduled Meds:  apixaban  2.5 mg Oral BID   vitamin C  500 mg Oral Daily   Chlorhexidine Gluconate Cloth  6 each Topical Daily   diltiazem  120 mg Oral Daily   diltiazem  30 mg Oral Once   feeding supplement (GLUCERNA SHAKE)  237 mL Oral TID BM   gabapentin  100 mg Oral QHS   metoprolol succinate  75 mg Oral Daily   zinc sulfate  220 mg Oral Daily   Continuous Infusions:  lactated ringers 75 mL/hr  at 09/30/20 1502   remdesivir 100 mg in NS 100 mL Stopped (09/30/20 1020)   PRN Meds:.acetaminophen, ondansetron **OR** ondansetron (ZOFRAN) IV    Anti-infectives (From admission, onward)    Start     Dose/Rate Route Frequency Ordered Stop   09/30/20 1000  remdesivir 100 mg in sodium chloride 0.9 % 100 mL IVPB        100 mg 200 mL/hr over 30 Minutes Intravenous Daily 09/29/20 2132 10/02/20 0959   09/29/20 2145  remdesivir 100 mg in sodium chloride 0.9 % 100 mL IVPB        100 mg 200 mL/hr over 30 Minutes Intravenous Every 30 min 09/29/20 2132 09/30/20 0030         Objective:   Vitals:   09/30/20 1400 09/30/20 1500 09/30/20 1600 09/30/20 1700  BP: (!) 155/59 (!) 141/84 (!) 142/73 (!) 143/97  Pulse: 75 (!) 48 76 70  Resp:      Temp:      TempSrc:      SpO2: 95% 97% 97% 97%  Weight:      Height:        Wt Readings from Last 3 Encounters:  09/29/20 68 kg  08/30/20 73.9 kg  08/04/20 72.8 kg     Intake/Output Summary (Last 24 hours) at 09/30/2020 1758 Last data filed at 09/30/2020 1709 Gross per 24 hour  Intake 2638.98 ml  Output 800 ml  Net 1838.98 ml     Physical Exam  Gen:- Awake Alert,  in no apparent distress  HEENT:- [place].AT, No sclera icterus Neck-Supple Neck,No JVD,.  Lungs-  CTAB , fair symmetrical air movement CV- S1, S2 normal, irregularly irregular  abd-  +ve B.Sounds, Abd Soft, No tenderness,    Extremity/Skin:- No  edema, pedal pulses present  Psych-affect is appropriate, oriented x3 Neuro-no new focal deficits, no tremors   Data Review:   Micro Results Recent Results (from the past 240 hour(s))  Resp Panel by RT-PCR (Flu A&B, Covid) Nasopharyngeal Swab     Status: Abnormal   Collection Time: 09/29/20  6:12 PM   Specimen: Nasopharyngeal Swab; Nasopharyngeal(NP) swabs in vial transport medium  Result Value Ref Range Status   SARS Coronavirus 2 by RT PCR POSITIVE (A) NEGATIVE Final    Comment: RESULT CALLED TO, READ BACK BY AND VERIFIED  WITH: BELTON,K @ 2003 ON 09/29/20 BY JUW (NOTE) SARS-CoV-2 target nucleic acids are DETECTED.  The SARS-CoV-2 RNA is generally detectable in upper respiratory specimens during the acute phase of infection. Positive results are indicative of the presence of the identified virus, but do not rule out bacterial infection or co-infection with other pathogens not detected by the test. Clinical correlation  with patient history and other diagnostic information is necessary to determine patient infection status. The expected result is Negative.  Fact Sheet for Patients: EntrepreneurPulse.com.au  Fact Sheet for Healthcare Providers: IncredibleEmployment.be  This test is not yet approved or cleared by the [place] FDA and  has been authorized for detection and/or diagnosis of SARS-CoV-2 by FDA under an Emergency Use Authorization (EUA).  This EUA will remain in effect (meaning this test can be  used) for the duration of  the COVID-19 declaration under Section 564(b)(1) of the Act, 21 U.S.C. section 360bbb-3(b)(1), unless the authorization is terminated or revoked sooner.     Influenza A by PCR NEGATIVE NEGATIVE Final   Influenza B by PCR NEGATIVE NEGATIVE Final    Comment: (NOTE) The Xpert Xpress SARS-CoV-2/FLU/RSV plus assay is intended as an aid in the diagnosis of influenza from Nasopharyngeal swab specimens and should not be used as a sole basis for treatment. Nasal washings and aspirates are unacceptable for Xpert Xpress SARS-CoV-2/FLU/RSV testing.  Fact Sheet for Patients: EntrepreneurPulse.com.au  Fact Sheet for Healthcare Providers: IncredibleEmployment.be  This test is not yet approved or cleared by the [place] FDA and has been authorized for detection and/or diagnosis of SARS-CoV-2 by FDA under an Emergency Use Authorization (EUA). This EUA will remain in effect (meaning this test can be used)  for the duration of the COVID-19 declaration under Section 564(b)(1) of the Act, 21 U.S.C. section 360bbb-3(b)(1), unless the authorization is terminated or revoked.  Performed at [facility], [address], [place], [place] [address]   Blood Culture (routine x 2)     Status: None (Preliminary result)   Collection Time: 09/29/20  9:34 PM   Specimen: BLOOD  Result Value Ref Range Status   Specimen Description BLOOD  Final   Special Requests NONE  Final   Culture   Final    NO GROWTH < 12 HOURS Performed at [facility], [address], [place], [place] [address]    Report Status PENDING  Incomplete  Blood Culture (routine x 2)     Status: None (Preliminary result)   Collection Time: 09/29/20  9:59 PM   Specimen: BLOOD  Result Value Ref Range Status   Specimen Description BLOOD  Final   Special Requests NONE  Final   Culture   Final    NO GROWTH < 12 HOURS Performed at [facility], [address] [place], [place] [address]    Report Status PENDING  Incomplete  MRSA Next Gen by PCR, Nasal     Status: None   Collection Time: 09/29/20 10:45 PM   Specimen: Nasal Mucosa; Nasal Swab  Result Value Ref Range Status   MRSA by PCR Next Gen NOT DETECTED NOT DETECTED Final    Comment: (NOTE) The GeneXpert MRSA Assay (FDA approved for NASAL specimens only), is one component of a comprehensive MRSA colonization surveillance program. It is not intended to diagnose MRSA infection nor to guide or monitor treatment for MRSA infections. Test performance is not FDA approved in patients less than 28 years old. Performed at [facility], [address], [place], [place] [address]     Radiology Reports CT ABDOMEN PELVIS WO CONTRAST  Result Date: 09/29/2020 CLINICAL DATA:  Left lower quadrant and right lower quadrant pain, nausea, vomiting, and diarrhea for 2 months. EXAM: CT ABDOMEN AND PELVIS WITHOUT CONTRAST TECHNIQUE: Multidetector CT imaging of the abdomen and pelvis was performed following  the standard protocol without IV contrast. COMPARISON:  08/05/2020 FINDINGS: Lower  chest: Mild interstitial fibrosis and mild bronchiectasis in the lung bases. Hepatobiliary: No focal liver abnormality is seen. Status post cholecystectomy. No biliary dilatation. Pancreas: Unremarkable. No pancreatic ductal dilatation or surrounding inflammatory changes. Spleen: Normal in size without focal abnormality. Adrenals/Urinary Tract: No adrenal gland nodules. Calcifications in the right kidney likely representing vascular calcification. No obstructing stones identified. No hydronephrosis or hydroureter. Bladder is unremarkable. Stomach/Bowel: Stomach, small bowel, and colon are not abnormally distended. No wall thickening or inflammatory changes are appreciated. Appendix is not identified. Vascular/Lymphatic: Aortic atherosclerosis. No enlarged abdominal or pelvic lymph nodes. Reproductive: Status post hysterectomy. No adnexal masses. Other: No abdominal wall hernia or abnormality. No abdominopelvic ascites. Musculoskeletal: Postoperative fixation of the lower lumbar spine. Degenerative changes in the spine. No destructive bone lesions. IMPRESSION: No acute process demonstrated in the abdomen or pelvis. No evidence of bowel obstruction or inflammation. Aortic atherosclerosis. Electronically Signed   By: Lucienne Capers M.D.   On: 09/29/2020 19:33   DG Chest 2 View  Result Date: 09/29/2020 CLINICAL DATA:  Chest pain.  Rule out CHF EXAM: CHEST - 2 VIEW COMPARISON:  11/08/2019 FINDINGS: Normal heart size. No pleural effusion or edema. Chronic diffuse reticular interstitial markings are unchanged from previous exam. No superimposed airspace consolidation. IMPRESSION: 1. Stable chronic interstitial lung disease. 2. No signs of CHF. Electronically Signed   By: Kerby Moors M.D.   On: 09/29/2020 16:31     CBC Recent Labs  Lab 09/29/20 1634 09/30/20 0433  WBC 9.9 6.6  HGB 12.2 9.6*  HCT 41.1 31.0*  PLT 465* 351   MCV 81.5 78.3*  MCH 24.2* 24.2*  MCHC 29.7* 31.0  RDW 21.7* 20.8*  LYMPHSABS  --  2.5  MONOABS  --  0.7  EOSABS  --  0.0  BASOSABS  --  0.0    Chemistries  Recent Labs  Lab 09/29/20 1634 09/29/20 1811 09/30/20 0433  NA 132*  --  132*  K 4.4  --  4.3  CL 100  --  97*  CO2 16*  --  24  GLUCOSE 187*  --  98  BUN 39*  --  42*  CREATININE 1.96*  --  1.87*  CALCIUM 8.7*  --  8.0*  MG  --   --  2.1  AST  --  36 25  ALT  --  23 17  ALKPHOS  --  89 69  BILITOT  --  0.5 0.3   ------------------------------------------------------------------------------------------------------------------ Recent Labs    09/29/20 2159  TRIG 84    Lab Results  Component Value Date   HGBA1C 7.0 (H) 06/16/2020   ------------------------------------------------------------------------------------------------------------------ Recent Labs    09/29/20 1545  TSH 6.142*   ------------------------------------------------------------------------------------------------------------------ Recent Labs    09/29/20 2159 09/30/20 0433  FERRITIN 61 50    Coagulation profile No results for input(s): INR, PROTIME in the last 168 hours.  Recent Labs    09/29/20 2159 09/30/20 0433  DDIMER 0.53* 0.36    Cardiac Enzymes No results for input(s): CKMB, TROPONINI, MYOGLOBIN in the last 168 hours.  Invalid input(s): CK ------------------------------------------------------------------------------------------------------------------    Component Value Date/Time   BNP 365.4 (H) 11/09/2019 1825     Roxan Hockey M.D on 09/30/2020 at 5:58 PM  Go to [email or web address] - for contact info  [facility] - Office  [phone]

## 2020-10-01 LAB — COMPREHENSIVE METABOLIC PANEL
ALT: 19 U/L (ref 0–44)
AST: 25 U/L (ref 15–41)
Albumin: 2.8 g/dL — ABNORMAL LOW (ref 3.5–5.0)
Alkaline Phosphatase: 64 U/L (ref 38–126)
Anion gap: 7 (ref 5–15)
BUN: 33 mg/dL — ABNORMAL HIGH (ref 8–23)
CO2: 25 mmol/L (ref 22–32)
Calcium: 8.1 mg/dL — ABNORMAL LOW (ref 8.9–10.3)
Chloride: 102 mmol/L (ref 98–111)
Creatinine, Ser: 1.36 mg/dL — ABNORMAL HIGH (ref 0.44–1.00)
GFR, Estimated: 38 mL/min — ABNORMAL LOW (ref >60)
Glucose, Bld: 115 mg/dL — ABNORMAL HIGH (ref 70–99)
Potassium: 3.7 mmol/L (ref 3.5–5.1)
Sodium: 134 mmol/L — ABNORMAL LOW (ref 135–145)
Total Bilirubin: 0.2 mg/dL — ABNORMAL LOW (ref 0.3–1.2)
Total Protein: 6.1 g/dL — ABNORMAL LOW (ref 6.5–8.1)

## 2020-10-01 LAB — HEMOGLOBIN A1C
Hgb A1c MFr Bld: 7.1 % — ABNORMAL HIGH (ref 4.8–5.6)
Mean Plasma Glucose: 157 mg/dL

## 2020-10-01 LAB — PHOSPHORUS: Phosphorus: 3.1 mg/dL (ref 2.5–4.6)

## 2020-10-01 LAB — CBC WITH DIFFERENTIAL/PLATELET
Abs Immature Granulocytes: 0.1 10*3/uL — ABNORMAL HIGH (ref 0.00–0.07)
Basophils Absolute: 0 10*3/uL (ref 0.0–0.1)
Basophils Relative: 0 %
Eosinophils Absolute: 0.1 10*3/uL (ref 0.0–0.5)
Eosinophils Relative: 1 %
HCT: 31.5 % — ABNORMAL LOW (ref 36.0–46.0)
Hemoglobin: 9.6 g/dL — ABNORMAL LOW (ref 12.0–15.0)
Immature Granulocytes: 2 %
Lymphocytes Relative: 29 %
Lymphs Abs: 1.9 10*3/uL (ref 0.7–4.0)
MCH: 24.1 pg — ABNORMAL LOW (ref 26.0–34.0)
MCHC: 30.5 g/dL (ref 30.0–36.0)
MCV: 78.9 fL — ABNORMAL LOW (ref 80.0–100.0)
Monocytes Absolute: 0.5 10*3/uL (ref 0.1–1.0)
Monocytes Relative: 7 %
Neutro Abs: 4 10*3/uL (ref 1.7–7.7)
Neutrophils Relative %: 61 %
Platelets: 341 10*3/uL (ref 150–400)
RBC: 3.99 MIL/uL (ref 3.87–5.11)
RDW: 20.8 % — ABNORMAL HIGH (ref 11.5–15.5)
WBC: 6.6 10*3/uL (ref 4.0–10.5)
nRBC: 0 % (ref 0.0–0.2)

## 2020-10-01 LAB — D-DIMER, QUANTITATIVE: D-Dimer, Quant: 0.61 ug/mL-FEU — ABNORMAL HIGH (ref 0.00–0.50)

## 2020-10-01 LAB — FERRITIN: Ferritin: 35 ng/mL (ref 11–307)

## 2020-10-01 LAB — MAGNESIUM: Magnesium: 1.9 mg/dL (ref 1.7–2.4)

## 2020-10-01 LAB — C-REACTIVE PROTEIN: CRP: 3.9 mg/dL — ABNORMAL HIGH (ref <1.0)

## 2020-10-01 MED ORDER — APIXABAN 5 MG PO TABS: 5.0000 mg | ORAL_TABLET | Freq: Two times a day (BID) | ORAL | Status: DC

## 2020-10-01 MED ORDER — GUAIFENESIN-DM 100-10 MG/5ML PO SYRP: 5.0000 mL | ORAL_SOLUTION | ORAL | 0 refills | Status: DC | PRN

## 2020-10-01 MED ORDER — ASCORBIC ACID 500 MG PO TABS
500.0000 mg | ORAL_TABLET | Freq: Every day | ORAL | 2 refills | Status: AC
Start: 2020-10-02 — End: ?

## 2020-10-01 MED ORDER — GUAIFENESIN ER 600 MG PO TB12: 600.0000 mg | ORAL_TABLET | Freq: Two times a day (BID) | ORAL | 0 refills | Status: AC

## 2020-10-01 MED ORDER — ZINC SULFATE 220 (50 ZN) MG PO CAPS: 220.0000 mg | ORAL_CAPSULE | Freq: Every day | ORAL | 3 refills | Status: AC

## 2020-10-01 NOTE — TOC Transition Note (Signed)
Transition of Care [facility]) - CM/SW Discharge Note   Patient Details  Name: [name] MRN: [id number] Date of Birth: Jul 31, 1932  Transition of Care [facility]) CM/SW Contact:  Natasha Bence, LCSW Phone Number: 10/01/2020, 2:57 PM   Clinical Narrative:    CSW notified of patient's readiness for discharge, [place] needs and DME needs. CSW referred patient to Georgina Snell with [facility] for [facility]. Georgina Snell agreeable to take patient. CSW referred patient to [facility] with [facility] for 3n1. [employer] agreeable to provide 3n1. Patient requested hospital bed, but a referral was also placed with patient's PCP. CSW informed patient's daughter that [name] would not be able to provide referral update for patient's PCP. CSW also notified patient's daughter that per Zack with [employer], no hospital beds were available from company. Patient's daughter inquired about equipment for sleeping. CSW notified patient of option for PCP referral for Lift chair. CSW not able to place referral for Lift chair from hospital. TOC signing off.    Final next level of care: [place] Barriers to Discharge: Barriers Resolved   Patient Goals and CMS Choice Patient states their goals for this hospitalization and ongoing recovery are:: Return home with [facility] CMS Medicare.gov Compare Post Acute Care list provided to:: Patient Choice offered to / list presented to : Patient  Discharge Placement                    Patient and family notified of of transfer: 10/01/20  Discharge Plan and Services                DME Arranged: 3-N-1 DME Agency: [employer] Date DME Agency Contacted: 10/01/20 Time DME Agency Contacted: [phone] Representative spoke with at DME Agency: [place]: PT [place]: [place]        Social Determinants of Health ([place]) Interventions     Readmission Risk Interventions Readmission Risk Prevention Plan 09/21/2019  Transportation Screening Complete  Home Care Screening Complete   Medication Review (RN CM) Complete  Some recent data might be hidden

## 2020-10-01 NOTE — Progress Notes (Signed)
Discharge paperwork and instructions provided to patient. PIVs removed. Patient's daughter Otila Kluver arrived to pick up patient. RN transported pt via wheelchair to main entrance

## 2020-10-01 NOTE — Progress Notes (Signed)
SATURATION QUALIFICATIONS: (This note is used to comply with regulatory documentation for home oxygen)  Patient Saturations on Room Air at Rest = 100%  Patient Saturations on Room Air while Ambulating = 94%  Patient Saturations on 0 Liters of oxygen while Ambulating = 94%  Please briefly explain why patient needs home oxygen: N/A  Patient ambulated with RN in room, O2 saturation stayed above 90%, Heart rate maintained a rate of 60s-80s. Patient now sitting upright in chair. Dr. Joesph Fillers notified.

## 2020-10-01 NOTE — Discharge Instructions (Signed)
1) You are strongly advised to isolate/quarantine for at least 10 days from the date of your diagnosis with COVID-19 infection--please always wear a mask if you have to go outside the house  2)You advised to get Moderna or [place]  Covid Vaccine in about  3 weeks from now to reduce your chance of getting severe Covid 19 reinfection   3)Please take medications as prescribed  4)Video/Virtual follow-up visit with primary care physician in about a week advised

## 2020-10-01 NOTE — Discharge Summary (Signed)
[name], is a 87 y.o. female  DOB 11-28-1932  MRN [id number].  Admission date:  09/29/2020  Admitting Physician  Bernadette Hoit, DO  Discharge Date:  10/01/2020   Primary MD  Rosalee Kaufman, PA-C  Recommendations for primary care physician for things to follow:   1) You are strongly advised to isolate/quarantine for at least 10 days from the date of your diagnosis with COVID-19 infection--please always wear a mask if you have to go outside the house  2)You advised to get Modern[place] or [place]  Covid Vaccine in about  3 weeks from now to reduce your chance of getting severe Covid 19 reinfection   3)Please take medications as prescribed  4)Video/Virtual follow-up visit with primary care physician in about a week advised   Admission Diagnosis  COVID-19 virus infection [U07.1]   Discharge Diagnosis  COVID-19 virus infection [U07.1]    Principal Problem:   AF (paroxysmal atrial fibrillation) ([place]) Active Problems:   Gastroesophageal reflux disease without esophagitis   COPD (chronic obstructive pulmonary disease) ([place])   COVID-19 virus infection   Essential hypertension   Acute kidney injury superimposed on CKD ([place])   CHF (congestive heart failure) ([place])   Diarrhea   Hyponatremia   Thrombocytosis   Atypical chest pain   CAD (coronary artery disease)   Generalized weakness   Hypoalbuminemia   Dehydration   Peripheral neuropathy      Past Medical History:  Diagnosis Date   Anxiety    Arthritis    Atrial fibrillation ([place])    Bursitis    Left shoulder   Cataract    CHF (congestive heart failure) ([place])    CKD (chronic kidney disease)    stage 3-4   COPD (chronic obstructive pulmonary disease) ([place])    Coronary atherosclerosis of native coronary artery    a. s/p DES to LCx in 04/2013 b. cath in 11/2015 showing patent stent with 20% prox-LAD and 80% ostial RCA stenosis for which  medical management was recommended due to small artery size   Depression    Diastolic heart failure (HCC)    EF 55-60%   Dysphagia, unspecified(787.20)    Dyspnea    Dysrhythmia    Essential hypertension    GERD (gastroesophageal reflux disease)    Hx Schatzki's ring, multiple EGD/ED last 01/06/2004   Gout    Headache    History of anemia    Hyperlipidemia    Internal hemorrhoids without mention of complication    MI (myocardial infarction) ([place]) 2006   Microscopic colitis 2003   Panic disorder without agoraphobia    Paresthesia    Pneumonia 12/2011   PVD (peripheral vascular disease) ([place])    S/P colonoscopy 09/27/2001   internal hemorrhoids, desc colon inflam polyp, SB BX-chronic duodenitis, colitis   Sleep apnea    Thyroid disease     Past Surgical History:  Procedure Laterality Date   ABDOMINAL HYSTERECTOMY     ABDOMINAL HYSTERECTOMY     AGILE CAPSULE N/A 08/30/2020   Procedure:  AGILE CAPSULE;  Surgeon: Daneil Dolin, MD;  [facility];  Service: Endoscopy;  Laterality: N/A;  7:30am   ANTERIOR AND POSTERIOR REPAIR     with resection of vagina   ANTERIOR LAT LUMBAR FUSION N/A 08/01/2016   Procedure: Lumbar Two-Lumbar Five Transpsoas lateral interbody fusion with Lumbar Two-Three lateral plate fixation;  Surgeon: Kevan Ny Ditty, MD;  Location: [facility];  Service: Neurosurgery;  Laterality: N/A;  L2-5 Transpsoas lateral interbody fusion with L2-3 lateral plate fixation   APPENDECTOMY     BACK SURGERY     BIOPSY  07/05/2015   Procedure: BIOPSY;  Surgeon: Daneil Dolin, MD;  [facility];  Service: Endoscopy;;  gastric polyp biopsy, ascending colon biopsy   BIOPSY  02/16/2020   Procedure: BIOPSY;  Surgeon: Daneil Dolin, MD;  [facility];  Service: Endoscopy;;   BIOPSY  06/28/2020   Procedure: BIOPSY;  Surgeon: Daneil Dolin, MD;  [facility];  Service: Endoscopy;;   BLADDER SUSPENSION  11/09/2011   Procedure: TRANSVAGINAL  TAPE (TVT) PROCEDURE;  Surgeon: Marissa Nestle, MD;  [facility];  Service: Urology;  Laterality: N/A;   bladder tack  06/2010   BREAST LUMPECTOMY  1998   left, benign   CARDIAC CATHETERIZATION     CARDIAC CATHETERIZATION     CARDIAC CATHETERIZATION N/A 12/16/2015   Procedure: Left Heart Cath and Coronary Angiography;  Surgeon: Troy Sine, MD;  Location: [facility];  Service: Cardiovascular;  Laterality: N/A;   CARDIOVERSION N/A 10/04/2017   Procedure: CARDIOVERSION;  Surgeon: Herminio Commons, MD;  [facility];  Service: Cardiovascular;  Laterality: N/A;   CARDIOVERSION N/A 01/30/2018   Procedure: CARDIOVERSION;  Surgeon: Herminio Commons, MD;  [facility];  Service: Cardiovascular;  Laterality: N/A;   CARDIOVERSION N/A 11/10/2019   Procedure: CARDIOVERSION;  Surgeon: Josue Hector, MD;  Location: [facility];  Service: Cardiovascular;  Laterality: N/A;   [place]   left   cataract surgery     CHOLECYSTECTOMY  1998   Cholecystectomy     COLONOSCOPY  03/16/2011   multiple hyperplastic colon polyps, sigmoid diverticulosis, melanosis coli   COLONOSCOPY WITH PROPOFOL N/A 07/05/2015   RMR:one 5 mm polyp in descending colon   COLONOSCOPY WITH PROPOFOL N/A 06/28/2020   Procedure: COLONOSCOPY WITH PROPOFOL;  Surgeon: Daneil Dolin, MD;  [facility];  Service: Endoscopy;  Laterality: N/A;  am appt   CORONARY ANGIOGRAPHY N/A 05/16/2018   Procedure: CORONARY ANGIOGRAPHY (CATH LAB);  Surgeon: Belva Crome, MD;  Location: [facility];  Service: Cardiovascular;  Laterality: N/A;   CORONARY ANGIOPLASTY WITH STENT PLACEMENT  2015   ESOPHAGEAL DILATION N/A 07/05/2015   Procedure: ESOPHAGEAL DILATION;  Surgeon: Daneil Dolin, MD;  [facility];  Service: Endoscopy;  Laterality: N/A;   ESOPHAGOGASTRODUODENOSCOPY (EGD) WITH PROPOFOL N/A 07/05/2015   ZOX:WRUEAV   ESOPHAGOGASTRODUODENOSCOPY (EGD) WITH PROPOFOL N/A  02/16/2020   Procedure: ESOPHAGOGASTRODUODENOSCOPY (EGD) WITH PROPOFOL;  Surgeon: Daneil Dolin, MD;  Normal examined esophagus s/p dilation, erythematous mucosa in the stomach s/p biopsy, normal examined duodenum. Pathology with mild gastropathy, negative for H. Pylori.    JOINT REPLACEMENT Right 2007   right knee   left hand surgery     LEFT HEART CATHETERIZATION WITH CORONARY ANGIOGRAM N/A 05/14/2013   Procedure: LEFT HEART CATHETERIZATION WITH CORONARY ANGIOGRAM;  Surgeon: Blane Ohara, MD;  Location:  [facility] LAB;  Service: Cardiovascular;  Laterality: N/A;   left rotator cuff surgery     LUMBAR LAMINECTOMY/DECOMPRESSION MICRODISCECTOMY N/A 10/11/2012   Procedure: LUMBAR LAMINECTOMY/DECOMPRESSION MICRODISCECTOMY 2 LEVELS;  Surgeon: Floyce Stakes, MD;  Location: [facility];  Service: Neurosurgery;  Laterality: N/A;  L3-4 L4-5 Laminectomy   LUMBAR WOUND DEBRIDEMENT N/A 09/27/2015   Procedure: Exploration of Lumbar Wound w/ Repair CSF Leak/Lumbar Drain Placement;  Surgeon: Leeroy Cha, MD;  Location: [facility];  Service: Neurosurgery;  Laterality: N/A;   MALONEY DILATION  03/16/2011   Gastritis. No H.pylori on bx. 45F maloney dilation with disruption of  occult cevical esophageal web   MALONEY DILATION N/A 02/16/2020   Procedure: MALONEY DILATION;  Surgeon: Daneil Dolin, MD;  [facility];  Service: Endoscopy;  Laterality: N/A;   NASAL SINUS SURGERY     right knee replacement  2007   right leg benign tumor     SHOULDER SURGERY Left    TEE WITHOUT CARDIOVERSION N/A 10/04/2017   Procedure: TRANSESOPHAGEAL ECHOCARDIOGRAM (TEE) WITH PROPOFOL;  Surgeon: Herminio Commons, MD;  [facility];  Service: Cardiovascular;  Laterality: N/A;   TEE WITHOUT CARDIOVERSION N/A 11/10/2019   Procedure: TRANSESOPHAGEAL ECHOCARDIOGRAM (TEE);  Surgeon: Josue Hector, MD;  Location: [facility] ENDOSCOPY;  Service: Cardiovascular;  Laterality: N/A;   TONSILLECTOMY     unspecified area,  hysterectomy  1972   partial     HPI  from the history and physical done on the day of admission:   Chief Complaint: Chest pain   HPI: LACARA DUNSWORTH is an 87 y.o. female with medical history significant for atrial fibrillation on Eliquis, diastolic CHF, CAD s/p DES to LCx (04/2013), GERD, COPD who presents to the emergency department with complaints of reproducible sharp left-sided chest pain which has been going on for 4 days.  Patient has a history of afibrillation and she states that she has noted about 3 weeks of increased heart rate without producing any significant discomfort, however, within last 3-4 days, the abnormal rhythm appears to be faster and was associated with onset of the above described chest pain with associated shortness of breath.  Patient also complained of some episodes of nonbloody diarrhea which was followed by constipation within last several days.  She complained of increased weakness due to decreased oral intake (food and fluid) within last 2 days, however patient denies fever, chills, headache, nausea vomiting.   ED Course:  In the emergency department, she was tachypneic, otherwise she was hemodynamically stable.  In the ED showed normal CBC except for thrombocytosis, BMP shows hyponatremia, BUN to creatinine 39/1.96 (baseline creatinine at 1.1-1.3), hyperglycemia, D-dimer 0.53, fibrinogen 536, TSH 6.142, albumin 3.4, procalcitonin 0.42, CRP 3.9, lactic acid 1.9, ferritin 61.  Influenza A and B was negative.  SARS coronavirus 2 was positive. CT abdomen and pelvis without contrast showed no acute process demonstrated in the abdomen or pelvis. No evidence of bowel obstruction or inflammation Chest x-ray showed stable chronic interstitial lung disease and no signs of CHF Patient was started on IV remdesivir, Cardizem 10 mg x 1 was given.  Hospitalist was asked to admit patient for further evaluation and management.      Hospital Course:    Brief Summary:- 87 y.o.  female with medical history significant for atrial fibrillation on Eliquis, diastolic CHF, CAD s/p DES to LCx (04/2013), GERD, COPD admitted on 09/29/20 with afib with RVR and Covid 19 infection   A/p 1)PAFib with  RVR----rate control improved on iv cardizem, patient was transitioned back to oral Toprol-XL and Cardizem CD  Echo from -2/3//2022-----EF is 60-65% -Continue Eliquis for stroke prophylaxis   2) COVID-19 Resp infection- Unvaccinated -CT abd/pelvis and CXR w/o acute findings -No Hypoxia -No further diarrhea -Treated with iv Remdesevir prophylactically x3 doses --Zinc and vitamin C as ordered -Albuterol inhaler as needed   3)DM2-A1c 7.1 reflecting fair diabetic control, continue diet and lifestyle modification   4)COPD--no acute exacerbation, continue bronchodilators especially in setting of COVID-19 infection   5)AKI----acute kidney injury on CKD stage -  IV -Patient received gentle IV hydration -Creatinine is down to 1.36 from 1.96 on admission  6)CAD--- stable, continue metoprolol, no aspirin as patient is already on Eliquis,   7)Social/Ethics--plan of care discussed with patient and daughter, patient is a full code, daughter requested that the patient will come and  live with her when patient leaves the hospital with home health equipment   Disposition/--- Home with home health   Disposition: The patient is from: Home              Anticipated d/c is to: Home              Code Status :  -  Code Status: Full Code    Family Communication:   Discussed with daughter  Discharge Condition: Stable without hypoxia  Follow UP   Follow-up Information     [facility].   Specialty: Home Health Services Why: PT- Will call you to schedule the first home visit within 48 hours of discharge. Contact information: [place] [address] [phone]                  Consults obtained -   Diet and Activity  recommendation:  As advised  Discharge Instructions     Discharge Instructions     Call MD for:  difficulty breathing, headache or visual disturbances   Complete by: As directed    Call MD for:  persistant dizziness or light-headedness   Complete by: As directed    Call MD for:  persistant nausea and vomiting   Complete by: As directed    Call MD for:  severe uncontrolled pain   Complete by: As directed    Call MD for:  temperature >100.4   Complete by: As directed    Diet - low sodium heart healthy   Complete by: As directed    Discharge instructions   Complete by: As directed    1) You are strongly advised to isolate/quarantine for at least 10 days from the date of your diagnosis with COVID-19 infection--please always wear a mask if you have to go outside the house  2)You advised to get [place] or [place]  Covid Vaccine in about  3 weeks from now to reduce your chance of getting severe Covid 19 reinfection   3)Please take medications as prescribed  4)Video/Virtual follow-up visit with primary care physician in about a week advised   Discharge wound care:   Complete by: As directed    Topical wound care as advised   Increase activity slowly   Complete by: As directed          Discharge Medications     Allergies as of 10/01/2020       Reactions   Cephalosporins Diarrhea, Nausea Only   Lightheaded   Levaquin [levofloxacin In D5w] Swelling   Macrodantin [nitrofurantoin Macrocrystal] Swelling   Phenothiazines  Anaphylaxis, Hives   Polysorbate Anaphylaxis   Prednisone Shortness Of Breath   Buspirone Itching   Cardura [doxazosin Mesylate] Itching   Codeine Itching   Acyclovir And Related Itching   Redness of skin   Colcrys [colchicine] Nausea Only   Upset stomach    Prochlorperazine Other (See Comments)   "Upset stomach"   Ranexa [ranolazine]    Severe drop in BP   Atorvastatin Hives   Cramping; tolerates Crestor ok   Colestipol Palpitations   Ofloxacin Rash    Other Itching, Rash   "WOOL"= make skin look like it has been burned   Penicillins Other (See Comments)   Causes redness all over. Has patient had a PCN reaction causing immediate rash, facial/tongue/throat swelling, SOB or lightheadedness with hypotension: No Has patient had a PCN reaction causing severe rash involving mucus membranes or skin necrosis: No Has patient had a PCN reaction that required hospitalization No Has patient had a PCN reaction occurring within the last 10 years: No If all of the above answers are "NO", then may proceed with Cephalosporin use.   Pimozide Hives, Itching        Medication List     STOP taking these medications    sulfamethoxazole-trimethoprim 800-160 MG tablet Commonly known as: BACTRIM DS       TAKE these medications    acetaminophen 500 MG tablet Commonly known as: TYLENOL Take 500 mg by mouth every 6 (six) hours as needed for headache or moderate pain.   ALPRAZolam 0.5 MG tablet Commonly known as: XANAX Take 0.5 mg by mouth daily as needed for anxiety or sleep.   apixaban 5 MG Tabs tablet Commonly known as: Eliquis Take 1 tablet (5 mg total) by mouth 2 (two) times daily.   ascorbic acid 500 MG tablet Commonly known as: VITAMIN C Take 1 tablet (500 mg total) by mouth daily. Start taking on: October 02, 2020   Breztri Aerosphere 160-9-4.8 MCG/ACT Aero Generic drug: Budeson-Glycopyrrol-Formoterol Inhale 2 puffs into the lungs in the morning and at bedtime.   budesonide 3 MG 24 hr capsule Commonly known as: ENTOCORT EC Take 3 capsules (9 mg total) by mouth daily. What changed: Another medication with the same name was removed. Continue taking this medication, and follow the directions you see here.   CLEAR EYES OP Place 1 drop into both eyes daily as needed (itching).   diltiazem 120 MG 24 hr capsule Commonly known as: CARDIZEM CD TAKE ONE CAPSULE BY MOUTH DAILY   DULoxetine 60 MG capsule Commonly known as:  CYMBALTA Take 60 mg by mouth at bedtime.   ferrous sulfate 325 (65 FE) MG tablet Take 1 tablet (325 mg total) by mouth daily with breakfast.   fluticasone 0.05 % cream Commonly known as: CUTIVATE Apply 1 application topically 2 (two) times daily as needed (skin tears).   furosemide 40 MG tablet Commonly known as: LASIX TAKE 20 MG EVERY OTHER DAY ALTERNATING WITH 40 MG EVERY OTHER DAY   gabapentin 100 MG capsule Commonly known as: NEURONTIN Take 100 mg by mouth at bedtime.   guaiFENesin 600 MG 12 hr tablet Commonly known as: Mucinex Take 1 tablet (600 mg total) by mouth 2 (two) times daily for 10 days.   guaiFENesin-dextromethorphan 100-10 MG/5ML syrup Commonly known as: ROBITUSSIN DM Take 5 mLs by mouth every 4 (four) hours as needed for cough (chest congestion).   Hair/Skin/Nails Caps Take 1 tablet by mouth daily.   hydrocortisone 2.5 % rectal cream Commonly known as:  ANUSOL-HC Place 1 application rectally 2 (two) times daily.   isosorbide mononitrate 60 MG 24 hr tablet Commonly known as: IMDUR Take 60 mg by mouth daily.   magnesium oxide 400 MG tablet Commonly known as: MAG-OX TAKE ONE TABLET BY MOUTH DAILY.   metoprolol succinate 25 MG 24 hr tablet Commonly known as: TOPROL-XL Take 75 mg by mouth daily.   nitroGLYCERIN 0.4 MG SL tablet Commonly known as: NITROSTAT Place 1 tablet (0.4 mg total) under the tongue every 5 (five) minutes as needed for chest pain. Reported on 08/04/2015   pantoprazole 40 MG tablet Commonly known as: PROTONIX TAKE ONE TABLET BY MOUTH TWICE DAILY BEFORE MEALS   potassium chloride 10 MEQ tablet Commonly known as: KLOR-CON Take 1 tablet (10 mEq total) by mouth daily.   zinc sulfate 220 (50 Zn) MG capsule Take 1 capsule (220 mg total) by mouth daily. Start taking on: October 02, 2020               Durable Medical Equipment  (From admission, onward)           Start     Ordered   10/01/20 1426  DME 3-in-1  Once         10/01/20 1427              Discharge Care Instructions  (From admission, onward)           Start     Ordered   10/01/20 0000  Discharge wound care:       Comments: Topical wound care as advised   10/01/20 1427            Major procedures and Radiology Reports - PLEASE review detailed and final reports for all details, in brief -    CT ABDOMEN PELVIS WO CONTRAST  Result Date: 09/29/2020 CLINICAL DATA:  Left lower quadrant and right lower quadrant pain, nausea, vomiting, and diarrhea for 2 months. EXAM: CT ABDOMEN AND PELVIS WITHOUT CONTRAST TECHNIQUE: Multidetector CT imaging of the abdomen and pelvis was performed following the standard protocol without IV contrast. COMPARISON:  08/05/2020 FINDINGS: Lower chest: Mild interstitial fibrosis and mild bronchiectasis in the lung bases. Hepatobiliary: No focal liver abnormality is seen. Status post cholecystectomy. No biliary dilatation. Pancreas: Unremarkable. No pancreatic ductal dilatation or surrounding inflammatory changes. Spleen: Normal in size without focal abnormality. Adrenals/Urinary Tract: No adrenal gland nodules. Calcifications in the right kidney likely representing vascular calcification. No obstructing stones identified. No hydronephrosis or hydroureter. Bladder is unremarkable. Stomach/Bowel: Stomach, small bowel, and colon are not abnormally distended. No wall thickening or inflammatory changes are appreciated. Appendix is not identified. Vascular/Lymphatic: Aortic atherosclerosis. No enlarged abdominal or pelvic lymph nodes. Reproductive: Status post hysterectomy. No adnexal masses. Other: No abdominal wall hernia or abnormality. No abdominopelvic ascites. Musculoskeletal: Postoperative fixation of the lower lumbar spine. Degenerative changes in the spine. No destructive bone lesions. IMPRESSION: No acute process demonstrated in the abdomen or pelvis. No evidence of bowel obstruction or inflammation. Aortic  atherosclerosis. Electronically Signed   By: Lucienne Capers M.D.   On: 09/29/2020 19:33   DG Chest 2 View  Result Date: 09/29/2020 CLINICAL DATA:  Chest pain.  Rule out CHF EXAM: CHEST - 2 VIEW COMPARISON:  11/08/2019 FINDINGS: Normal heart size. No pleural effusion or edema. Chronic diffuse reticular interstitial markings are unchanged from previous exam. No superimposed airspace consolidation. IMPRESSION: 1. Stable chronic interstitial lung disease. 2. No signs of CHF. Electronically Signed   By: Lovena Le  Clovis Riley M.D.   On: 09/29/2020 16:31    Micro Results   Recent Results (from the past 240 hour(s))  Resp Panel by RT-PCR (Flu A&B, Covid) Nasopharyngeal Swab     Status: Abnormal   Collection Time: 09/29/20  6:12 PM   Specimen: Nasopharyngeal Swab; Nasopharyngeal(NP) swabs in vial transport medium  Result Value Ref Range Status   SARS Coronavirus 2 by RT PCR POSITIVE (A) NEGATIVE Final    Comment: RESULT CALLED TO, READ BACK BY AND VERIFIED WITH: BELTON,K @ 2003 ON 09/29/20 BY JUW (NOTE) SARS-CoV-2 target nucleic acids are DETECTED.  The SARS-CoV-2 RNA is generally detectable in upper respiratory specimens during the acute phase of infection. Positive results are indicative of the presence of the identified virus, but do not rule out bacterial infection or co-infection with other pathogens not detected by the test. Clinical correlation with patient history and other diagnostic information is necessary to determine patient infection status. The expected result is Negative.  Fact Sheet for Patients: EntrepreneurPulse.com.au  Fact Sheet for Healthcare Providers: IncredibleEmployment.be  This test is not yet approved or cleared by the [place] FDA and  has been authorized for detection and/or diagnosis of SARS-CoV-2 by FDA under an Emergency Use Authorization (EUA).  This EUA will remain in effect (meaning this test can be  used) for the  duration of  the COVID-19 declaration under Section 564(b)(1) of the Act, 21 U.S.C. section 360bbb-3(b)(1), unless the authorization is terminated or revoked sooner.     Influenza A by PCR NEGATIVE NEGATIVE Final   Influenza B by PCR NEGATIVE NEGATIVE Final    Comment: (NOTE) The Xpert Xpress SARS-CoV-2/FLU/RSV plus assay is intended as an aid in the diagnosis of influenza from Nasopharyngeal swab specimens and should not be used as a sole basis for treatment. Nasal washings and aspirates are unacceptable for Xpert Xpress SARS-CoV-2/FLU/RSV testing.  Fact Sheet for Patients: EntrepreneurPulse.com.au  Fact Sheet for Healthcare Providers: IncredibleEmployment.be  This test is not yet approved or cleared by the [place] FDA and has been authorized for detection and/or diagnosis of SARS-CoV-2 by FDA under an Emergency Use Authorization (EUA). This EUA will remain in effect (meaning this test can be used) for the duration of the COVID-19 declaration under Section 564(b)(1) of the Act, 21 U.S.C. section 360bbb-3(b)(1), unless the authorization is terminated or revoked.  Performed at [facility], [address] [place], [place] [address]   Blood Culture (routine x 2)     Status: None (Preliminary result)   Collection Time: 09/29/20  9:34 PM   Specimen: BLOOD  Result Value Ref Range Status   Specimen Description BLOOD  Final   Special Requests NONE  Final   Culture   Final    NO GROWTH 2 DAYS Performed at [facility], [address] [place], [place] [address]    Report Status PENDING  Incomplete  Blood Culture (routine x 2)     Status: None (Preliminary result)   Collection Time: 09/29/20  9:59 PM   Specimen: BLOOD  Result Value Ref Range Status   Specimen Description BLOOD  Final   Special Requests NONE  Final   Culture   Final    NO GROWTH 2 DAYS Performed at [facility], [address], [place], [place] [address]    Report  Status PENDING  Incomplete  MRSA Next Gen by PCR, Nasal     Status: None   Collection Time: 09/29/20 10:45 PM   Specimen: Nasal Mucosa; Nasal Swab  Result Value Ref Range Status   MRSA by PCR Next Gen NOT DETECTED NOT DETECTED Final    Comment: (NOTE) The GeneXpert MRSA Assay (FDA approved for NASAL specimens only), is one component of a comprehensive MRSA colonization surveillance program. It is not intended to diagnose MRSA infection nor to guide or monitor treatment for MRSA infections. Test performance is not FDA approved in patients less than 81 years old. Performed at [facility], [address], [place], [place] [address]        Today   Subjective    Crislyn Willbanks today has no new complaints, eating and drinking well, no shortness of breath, no chest pains no palpitations no dizziness -At rest O2 sats 100% on room air with and post ambulation O2 sats 94% on room air -         Patient has been seen and examined prior to discharge   Objective   Blood pressure (!) 141/40, pulse 65, temperature 99.6 F (37.6 C), temperature source Oral, resp. rate (!) 24, height 5\' 1"  (1.549 m), weight 68 kg, SpO2 97 %.   Intake/Output Summary (Last 24 hours) at 10/01/2020 1429 Last data filed at 10/01/2020 1319 Gross per 24 hour  Intake 2021.83 ml  Output 1500 ml  Net 521.83 ml    Exam Gen:- Awake Alert,  in no apparent distress  HEENT:- [place].AT, No sclera icterus Neck-Supple Neck,No JVD,. Lungs-  CTAB , fair symmetrical air movement CV- S1, S2 normal, irregularly irregular abd-  +ve B.Sounds, Abd Soft, No tenderness,    Extremity/Skin:- No  edema, pedal pulses present Psych-affect is appropriate, oriented x3 Neuro-no new focal deficits, no tremors   Data Review   CBC w Diff:  Lab Results  Component Value Date   WBC 6.6 10/01/2020   HGB 9.6 (L) 10/01/2020   HCT 31.5 (L) 10/01/2020   PLT 341 10/01/2020   LYMPHOPCT 29 10/01/2020   MONOPCT 7 10/01/2020   EOSPCT 1  10/01/2020   BASOPCT 0 10/01/2020    CMP:  Lab Results  Component Value Date   NA 134 (L) 10/01/2020   K 3.7 10/01/2020   CL 102 10/01/2020   CO2 25 10/01/2020   BUN 33 (H) 10/01/2020   CREATININE 1.36 (H) 10/01/2020   CREATININE 1.42 (H) 11/16/2017   PROT 6.1 (L) 10/01/2020   ALBUMIN 2.8 (L) 10/01/2020   BILITOT 0.2 (L) 10/01/2020   ALKPHOS 64 10/01/2020   AST 25 10/01/2020   ALT 19 10/01/2020  .   Total Discharge time is about 33 minutes  Roxan Hockey M.D on 10/01/2020 at 2:29 PM  Go to [email or web address] -  for contact info  [facility] - Office  [phone]

## 2020-10-04 LAB — CULTURE, BLOOD (ROUTINE X 2)
Culture: NO GROWTH
Culture: NO GROWTH
Special Requests: ADEQUATE
Special Requests: ADEQUATE

## 2020-10-05 ENCOUNTER — Telehealth: Payer: Self-pay | Admitting: Internal Medicine

## 2020-10-05 NOTE — Telephone Encounter (Signed)
[phone]  please call patient to reschedule procedure. she has covid

## 2020-10-05 NOTE — Telephone Encounter (Signed)
Called pt's daughter, [name] rescheduled to 11/10/20 at 8:30am. Endo scheduler informed. New instructions mailed.  Cyril Mourning, she is scheduled for f/u with you 10/29/20. Her daughter wants to know if she needs to keep OV appt 7/15 since it will be before she does the [place]?

## 2020-10-05 NOTE — Telephone Encounter (Signed)
Yes, lets plan to keep her follow-up as scheduled. I am concerned if we try to push this out, she will not be seen until November due to scheduling issues at this time.

## 2020-10-06 NOTE — Telephone Encounter (Signed)
Tried to call daughter, LMOVM to inform her Cyril Mourning prefers for her to keep upcoming OV 10/29/20.

## 2020-10-12 DIAGNOSIS — I2510 Atherosclerotic heart disease of native coronary artery without angina pectoris: Secondary | ICD-10-CM | POA: Diagnosis not present

## 2020-10-12 DIAGNOSIS — I4891 Unspecified atrial fibrillation: Secondary | ICD-10-CM | POA: Diagnosis not present

## 2020-10-12 DIAGNOSIS — J449 Chronic obstructive pulmonary disease, unspecified: Secondary | ICD-10-CM | POA: Diagnosis not present

## 2020-10-12 DIAGNOSIS — I509 Heart failure, unspecified: Secondary | ICD-10-CM | POA: Diagnosis not present

## 2020-10-12 DIAGNOSIS — R5383 Other fatigue: Secondary | ICD-10-CM | POA: Diagnosis not present

## 2020-10-12 DIAGNOSIS — Z6831 Body mass index (BMI) 31.0-31.9, adult: Secondary | ICD-10-CM | POA: Diagnosis not present

## 2020-10-12 DIAGNOSIS — U071 COVID-19: Secondary | ICD-10-CM | POA: Diagnosis not present

## 2020-10-12 DIAGNOSIS — D649 Anemia, unspecified: Secondary | ICD-10-CM | POA: Diagnosis not present

## 2020-10-12 DIAGNOSIS — E46 Unspecified protein-calorie malnutrition: Secondary | ICD-10-CM | POA: Diagnosis not present

## 2020-10-12 DIAGNOSIS — N179 Acute kidney failure, unspecified: Secondary | ICD-10-CM | POA: Diagnosis not present

## 2020-10-13 ENCOUNTER — Encounter: Payer: Self-pay | Admitting: *Deleted

## 2020-10-13 ENCOUNTER — Ambulatory Visit: Payer: PPO | Admitting: Cardiology

## 2020-10-13 ENCOUNTER — Encounter: Payer: Self-pay | Admitting: Cardiology

## 2020-10-13 VITALS — BP 130/68 | HR 61 | Ht 61.0 in | Wt 166.0 lb

## 2020-10-13 DIAGNOSIS — I2510 Atherosclerotic heart disease of native coronary artery without angina pectoris: Secondary | ICD-10-CM | POA: Diagnosis not present

## 2020-10-13 DIAGNOSIS — J449 Chronic obstructive pulmonary disease, unspecified: Secondary | ICD-10-CM | POA: Diagnosis not present

## 2020-10-13 DIAGNOSIS — I4891 Unspecified atrial fibrillation: Secondary | ICD-10-CM

## 2020-10-13 NOTE — Progress Notes (Signed)
Clinical Summary [name] is a 87 y.o.female  1. CAD - prior DES to LCX in 2015 - repeat caths 2017 and 2020 without obstructive disease other than ostial disease of a small RCA with recs for medical management - 10/2019 nuclear stress: no ischemia   - during last appt with PA Leonides Sake reported exertional chest pain   - 06/2020 nuclear stress: no ischemia   -still with epigastric pain. Sharp pain, can be associated with eating - recent pleuritic chest pains after covid infection about 2 weeks ago - no cardiac chest pain   2. NSVT - on toprol, has been followed by EP Dr Lovena Le   -denies any symptoms   3. Chronic diastolic HF  - 11/6765 echo LVEF 60-65%, no WMAs, indet diastolic fxn,  - no recent edema   4. Persistent afib - Recent TEE/DCCV performed 11/10/2019 with 3 shocks resulting in normal sinus rhythm - from notes does better breathing wise in SR   - issues with afib with RVR during June 2022 admission with COVID - rates have normalized   5. Syncope - recent monitor with rare SVT and NSVT, 2% afib burden. No arrhythmias consistent with cause of syncope   - no recent issues   5. CKD III   6. Melena/microcytic anemia - followed by GI -  admit to [facility] 01/2020, Hgb 6.7    7. Dysphagia - followed by GI      8 COPD - followed by pcp  9. COVID pneumonia - 2 day admission about 2 weeks ago - some ongoing cough, SOB, pleuritic chest pain Past Medical History:  Diagnosis Date   Anxiety    Arthritis    Atrial fibrillation (HCC)    Bursitis    Left shoulder   Cataract    CHF (congestive heart failure) (HCC)    CKD (chronic kidney disease)    stage 3-4   COPD (chronic obstructive pulmonary disease) (HCC)    Coronary atherosclerosis of native coronary artery    a. s/p DES to LCx in 04/2013 b. cath in 11/2015 showing patent stent with 20% prox-LAD and 80% ostial RCA stenosis for which medical management was recommended due to small artery size    Depression    Diastolic heart failure (HCC)    EF 55-60%   Dysphagia, unspecified(787.20)    Dyspnea    Dysrhythmia    Essential hypertension    GERD (gastroesophageal reflux disease)    Hx Schatzki's ring, multiple EGD/ED last 01/06/2004   Gout    Headache    History of anemia    Hyperlipidemia    Internal hemorrhoids without mention of complication    MI (myocardial infarction) ([place]) 2006   Microscopic colitis 2003   Panic disorder without agoraphobia    Paresthesia    Pneumonia 12/2011   PVD (peripheral vascular disease) (HCC)    S/P colonoscopy 09/27/2001   internal hemorrhoids, desc colon inflam polyp, SB BX-chronic duodenitis, colitis   Sleep apnea    Thyroid disease      Allergies  Allergen Reactions   Cephalosporins Diarrhea and Nausea Only    Lightheaded   Levaquin [Levofloxacin In D5w] Swelling   Macrodantin [Nitrofurantoin Macrocrystal] Swelling   Phenothiazines Anaphylaxis and Hives   Polysorbate Anaphylaxis   Prednisone Shortness Of Breath   Buspirone Itching   Cardura [Doxazosin Mesylate] Itching   Codeine Itching   Acyclovir And Related Itching    Redness of skin   Colcrys [Colchicine] Nausea Only  Upset stomach    Prochlorperazine Other (See Comments)    "Upset stomach"   Ranexa [Ranolazine]     Severe drop in BP   Atorvastatin Hives    Cramping; tolerates Crestor ok   Colestipol Palpitations   Ofloxacin Rash   Other Itching and Rash    "WOOL"= make skin look like it has been burned   Penicillins Other (See Comments)    Causes redness all over. Has patient had a PCN reaction causing immediate rash, facial/tongue/throat swelling, SOB or lightheadedness with hypotension: No Has patient had a PCN reaction causing severe rash involving mucus membranes or skin necrosis: No Has patient had a PCN reaction that required hospitalization No Has patient had a PCN reaction occurring within the last 10 years: No If all of the above answers are "NO", then  may proceed with Cephalosporin use.    Pimozide Hives and Itching     Current Outpatient Medications  Medication Sig Dispense Refill   acetaminophen (TYLENOL) 500 MG tablet Take 500 mg by mouth every 6 (six) hours as needed for headache or moderate pain.     ALPRAZolam (XANAX) 0.5 MG tablet Take 0.5 mg by mouth daily as needed for anxiety or sleep.     apixaban (ELIQUIS) 5 MG TABS tablet Take 1 tablet (5 mg total) by mouth 2 (two) times daily. 180 tablet 3   ascorbic acid (VITAMIN C) 500 MG tablet Take 1 tablet (500 mg total) by mouth daily. 30 tablet 2   Budeson-Glycopyrrol-Formoterol (BREZTRI AEROSPHERE) 160-9-4.8 MCG/ACT AERO Inhale 2 puffs into the lungs in the morning and at bedtime.     budesonide (ENTOCORT EC) 3 MG 24 hr capsule Take 3 capsules (9 mg total) by mouth daily. 90 capsule 1   diltiazem (CARDIZEM CD) 120 MG 24 hr capsule TAKE ONE CAPSULE BY MOUTH DAILY 90 capsule 1   DULoxetine (CYMBALTA) 60 MG capsule Take 60 mg by mouth at bedtime.     ferrous sulfate 325 (65 FE) MG tablet Take 1 tablet (325 mg total) by mouth daily with breakfast. 30 tablet 3   fluticasone (CUTIVATE) 0.05 % cream Apply 1 application topically 2 (two) times daily as needed (skin tears).     furosemide (LASIX) 40 MG tablet TAKE 20 MG EVERY OTHER DAY ALTERNATING WITH 40 MG EVERY OTHER DAY 135 tablet 1   gabapentin (NEURONTIN) 100 MG capsule Take 100 mg by mouth at bedtime.     guaiFENesin-dextromethorphan (ROBITUSSIN DM) 100-10 MG/5ML syrup Take 5 mLs by mouth every 4 (four) hours as needed for cough (chest congestion). 118 mL 0   hydrocortisone (ANUSOL-HC) 2.5 % rectal cream Place 1 application rectally 2 (two) times daily. 30 g 1   isosorbide mononitrate (IMDUR) 60 MG 24 hr tablet Take 60 mg by mouth daily.     magnesium oxide (MAG-OX) 400 MG tablet TAKE ONE TABLET BY MOUTH DAILY. 90 tablet 3   metoprolol succinate (TOPROL-XL) 25 MG 24 hr tablet Take 75 mg by mouth daily.     Multiple Vitamins-Minerals  (HAIR/SKIN/NAILS) CAPS Take 1 tablet by mouth daily.     Naphazoline HCl (CLEAR EYES OP) Place 1 drop into both eyes daily as needed (itching).     nitroGLYCERIN (NITROSTAT) 0.4 MG SL tablet Place 1 tablet (0.4 mg total) under the tongue every 5 (five) minutes as needed for chest pain. Reported on 08/04/2015 25 tablet 6   pantoprazole (PROTONIX) 40 MG tablet TAKE ONE TABLET BY MOUTH TWICE DAILY BEFORE MEALS 60 tablet  3   potassium chloride (KLOR-CON) 10 MEQ tablet Take 1 tablet (10 mEq total) by mouth daily. 180 tablet 1   zinc sulfate 220 (50 Zn) MG capsule Take 1 capsule (220 mg total) by mouth daily. 30 capsule 3   No current facility-administered medications for this visit.     Past Surgical History:  Procedure Laterality Date   ABDOMINAL HYSTERECTOMY     ABDOMINAL HYSTERECTOMY     AGILE CAPSULE N/A 08/30/2020   Procedure: AGILE CAPSULE;  Surgeon: Daneil Dolin, MD;  [facility];  Service: Endoscopy;  Laterality: N/A;  7:30am   ANTERIOR AND POSTERIOR REPAIR     with resection of vagina   ANTERIOR LAT LUMBAR FUSION N/A 08/01/2016   Procedure: Lumbar Two-Lumbar Five Transpsoas lateral interbody fusion with Lumbar Two-Three lateral plate fixation;  Surgeon: Kevan Ny Ditty, MD;  Location: [place];  Service: Neurosurgery;  Laterality: N/A;  L2-5 Transpsoas lateral interbody fusion with L2-3 lateral plate fixation   APPENDECTOMY     BACK SURGERY     BIOPSY  07/05/2015   Procedure: BIOPSY;  Surgeon: Daneil Dolin, MD;  [facility];  Service: Endoscopy;;  gastric polyp biopsy, ascending colon biopsy   BIOPSY  02/16/2020   Procedure: BIOPSY;  Surgeon: Daneil Dolin, MD;  [facility];  Service: Endoscopy;;   BIOPSY  06/28/2020   Procedure: BIOPSY;  Surgeon: Daneil Dolin, MD;  [facility];  Service: Endoscopy;;   BLADDER SUSPENSION  11/09/2011   Procedure: TRANSVAGINAL TAPE (TVT) PROCEDURE;  Surgeon: Marissa Nestle, MD;  Location: [facility];   Service: Urology;  Laterality: N/A;   bladder tack  06/2010   BREAST LUMPECTOMY  1998   left, benign   CARDIAC CATHETERIZATION     CARDIAC CATHETERIZATION     CARDIAC CATHETERIZATION N/A 12/16/2015   Procedure: Left Heart Cath and Coronary Angiography;  Surgeon: Troy Sine, MD;  Location: [place] [facility];  Service: Cardiovascular;  Laterality: N/A;   CARDIOVERSION N/A 10/04/2017   Procedure: CARDIOVERSION;  Surgeon: Herminio Commons, MD;  Location: [facility];  Service: Cardiovascular;  Laterality: N/A;   CARDIOVERSION N/A 01/30/2018   Procedure: CARDIOVERSION;  Surgeon: Herminio Commons, MD;  [facility];  Service: Cardiovascular;  Laterality: N/A;   CARDIOVERSION N/A 11/10/2019   Procedure: CARDIOVERSION;  Surgeon: Josue Hector, MD;  Location: [facility] ENDOSCOPY;  Service: Cardiovascular;  Laterality: N/A;   [place]   left   cataract surgery     CHOLECYSTECTOMY  1998   Cholecystectomy     COLONOSCOPY  03/16/2011   multiple hyperplastic colon polyps, sigmoid diverticulosis, melanosis coli   COLONOSCOPY WITH PROPOFOL N/A 07/05/2015   RMR:one 5 mm polyp in descending colon   COLONOSCOPY WITH PROPOFOL N/A 06/28/2020   Procedure: COLONOSCOPY WITH PROPOFOL;  Surgeon: Daneil Dolin, MD;  [facility];  Service: Endoscopy;  Laterality: N/A;  am appt   CORONARY ANGIOGRAPHY N/A 05/16/2018   Procedure: CORONARY ANGIOGRAPHY (CATH LAB);  Surgeon: Belva Crome, MD;  Location: [place] CV LAB;  Service: Cardiovascular;  Laterality: N/A;   CORONARY ANGIOPLASTY WITH STENT PLACEMENT  2015   ESOPHAGEAL DILATION N/A 07/05/2015   Procedure: ESOPHAGEAL DILATION;  Surgeon: Daneil Dolin, MD;  [facility];  Service: Endoscopy;  Laterality: N/A;   ESOPHAGOGASTRODUODENOSCOPY (EGD) WITH PROPOFOL N/A 07/05/2015   VZC:HYIFOY   ESOPHAGOGASTRODUODENOSCOPY (EGD) WITH PROPOFOL N/A 02/16/2020  Procedure: ESOPHAGOGASTRODUODENOSCOPY (EGD) WITH PROPOFOL;   Surgeon: Daneil Dolin, MD;  Normal examined esophagus s/p dilation, erythematous mucosa in the stomach s/p biopsy, normal examined duodenum. Pathology with mild gastropathy, negative for H. Pylori.    JOINT REPLACEMENT Right 2007   right knee   left hand surgery     LEFT HEART CATHETERIZATION WITH CORONARY ANGIOGRAM N/A 05/14/2013   Procedure: LEFT HEART CATHETERIZATION WITH CORONARY ANGIOGRAM;  Surgeon: Blane Ohara, MD;  Location: [facility] CATH LAB;  Service: Cardiovascular;  Laterality: N/A;   left rotator cuff surgery     LUMBAR LAMINECTOMY/DECOMPRESSION MICRODISCECTOMY N/A 10/11/2012   Procedure: LUMBAR LAMINECTOMY/DECOMPRESSION MICRODISCECTOMY 2 LEVELS;  Surgeon: Floyce Stakes, MD;  Location: [place] [facility];  Service: Neurosurgery;  Laterality: N/A;  L3-4 L4-5 Laminectomy   LUMBAR WOUND DEBRIDEMENT N/A 09/27/2015   Procedure: Exploration of Lumbar Wound w/ Repair CSF Leak/Lumbar Drain Placement;  Surgeon: Leeroy Cha, MD;  Location: [place] [facility];  Service: Neurosurgery;  Laterality: N/A;   MALONEY DILATION  03/16/2011   Gastritis. No H.pylori on bx. 25F maloney dilation with disruption of  occult cevical esophageal web   MALONEY DILATION N/A 02/16/2020   Procedure: MALONEY DILATION;  Surgeon: Daneil Dolin, MD;  [facility];  Service: Endoscopy;  Laterality: N/A;   NASAL SINUS SURGERY     right knee replacement  2007   right leg benign tumor     SHOULDER SURGERY Left    TEE WITHOUT CARDIOVERSION N/A 10/04/2017   Procedure: TRANSESOPHAGEAL ECHOCARDIOGRAM (TEE) WITH PROPOFOL;  Surgeon: Herminio Commons, MD;  [facility];  Service: Cardiovascular;  Laterality: N/A;   TEE WITHOUT CARDIOVERSION N/A 11/10/2019   Procedure: TRANSESOPHAGEAL ECHOCARDIOGRAM (TEE);  Surgeon: Josue Hector, MD;  Location[facility] END[facility]COPY;  Service: Cardiovascular;  Laterality: N/A;   TONSILLECTOMY     unspecified area, hysterectomy  1972   partial     Allergies  Allergen Reactions    Cephalosporins Diarrhea and Nausea Only    Lightheaded   Levaquin [Levofloxacin In D5w] Swelling   Macrodantin [Nitrofurantoin Macrocrystal] Swelling   Phenothiazines Anaphylaxis and Hives   Polysorbate Anaphylaxis   Prednisone Shortness Of Breath   Buspirone Itching   Cardura [Doxazosin Mesylate] Itching   Codeine Itching   Acyclovir And Related Itching    Redness of skin   Colcrys [Colchicine] Nausea Only    Upset stomach    Prochlorperazine Other (See Comments)    "Upset stomach"   Ranexa [Ranolazine]     Severe drop in BP   Atorvastatin Hives    Cramping; tolerates Crestor ok   Colestipol Palpitations   Ofloxacin Rash   Other Itching and Rash    "WOOL"= make skin look like it has been burned   Penicillins Other (See Comments)    Causes redness all over. Has patient had a PCN reaction causing immediate rash, facial/tongue/throat swelling, SOB or lightheadedness with hypotension: No Has patient had a PCN reaction causing severe rash involving mucus membranes or skin necrosis: No Has patient had a PCN reaction that required hospitalization No Has patient had a PCN reaction occurring within the last 10 years: No If all of the above answers are "NO", then may proceed with Cephalosporin use.    Pimozide Hives and Itching      Family History  Problem Relation Age of Onset   Stroke Mother    Parkinson's disease Father    Coronary artery disease Other        family  Hx-sons   Cancer Other    Stroke Other        family Hx   Hypertension Other        family Hx   Diabetes Brother    Heart disease Son        before age 58   Diabetes Son    Stroke Daughter 74   Colon cancer Grandson        diagnosed 67   Inflammatory bowel disease Neg Hx      Social History Ms. Parkison reports that she quit smoking about 18 years ago. Her smoking use included cigarettes. She started smoking about 72 years ago. She has a 64.00 pack-year smoking history. She has never used smokeless  tobacco. Ms. Bango reports previous alcohol use.   Review of Systems CONSTITUTIONAL: No weight loss, fever, chills, weakness or fatigue.  HEENT: Eyes: No visual loss, blurred vision, double vision or yellow sclerae.No hearing loss, sneezing, congestion, runny nose or sore throat.  SKIN: No rash or itching.  CARDIOVASCULAR: per hpi RESPIRATORY: No shortness of breath, cough or sputum.  GASTROINTESTINAL: No anorexia, nausea, vomiting or diarrhea. No abdominal pain or blood.  GENITOURINARY: No burning on urination, no polyuria NEUROLOGICAL: No headache, dizziness, syncope, paralysis, ataxia, numbness or tingling in the extremities. No change in bowel or bladder control.  MUSCULOSKELETAL: No muscle, back pain, joint pain or stiffness.  LYMPHATICS: No enlarged nodes. No history of splenectomy.  PSYCHIATRIC: No history of depression or anxiety.  ENDOCRINOLOGIC: No reports of sweating, cold or heat intolerance. No polyuria or polydipsia.  [name] Kitchen   Physical Examination Today's Vitals   10/13/20 1256  BP: 130/68  Pulse: 61  SpO2: 98%  Weight: 166 lb (75.3 kg)  Height: 5\' 1"  (1.549 m)   Body mass index is 31.37 kg/m.  Gen: resting comfortably, no acute distress HEENT: no scleral icterus, pupils equal round and reactive, no palptable cervical adenopathy,  CV: RRR, no m/r/g, no jvd Resp: Clear to auscultation bilaterally GI: abdomen is soft, non-tender, non-distended, normal bowel sounds, no hepatosplenomegaly MSK: extremities are warm, no edema.  Skin: warm, no rash Neuro:  no focal deficits Psych: appropriate affect   Diagnostic Studies  Cardiac Catheterization: 05/16/2018 Patent short left main 20 to 30% proximal LAD irregularity, with diffuse 20% narrowing within the mid vessel. Patent proximal circumflex stent with mid to distal circumflex irregularities up to 30 to 40%. Nondominant right coronary with ostial 90% narrowing. No hemodynamics recorded.  Left ventriculography was  not performed in an effort to decrease contrast exposure in the setting of CKD.   RECOMMENDATIONS:   Compared to prior angiography in 2017, no significant changes noted. Chest pain is atypical and may be more musculoskeletal or pleuritic. Resume Eliquis in 12 hours/a.m. of 05/17/2018   Echocardiogram 10/02/18:   1. The left ventricle has normal systolic function with an ejection  fraction of 60-65%. The cavity size was normal. There is mildly increased  left ventricular wall thickness. Left ventricular diastolic Doppler  parameters are consistent with impaired  relaxation. Elevated mean left atrial pressure.   2. The right ventricle has normal systolic function. The cavity was  normal. There is no increase in right ventricular wall thickness.   3. Left atrial size was mildly dilated.   4. The aortic valve is tricuspid. Mild thickening of the aortic valve.  Mild calcification of the aortic valve. Aortic valve regurgitation is mild  by color flow Doppler. No stenosis of the aortic valve. Mild aortic  annular calcification noted.   5. The mitral valve is abnormal. Mild thickening of the mitral valve  leaflet. Mild calcification of the mitral valve leaflet. There is mild  mitral annular calcification present. No evidence of mitral valve  stenosis.   6. The aortic root is normal in size and structure.   7. Pulmonary hypertension is indeterminant, inadequate TR jet.      Event monitor 05/13/19:   1. NSR 2. NSVT, lasting upto 6 beats 3. No prolonged pauses or bradycardia 4. No atrial fib     09/2019 echo 1. Left ventricular ejection fraction, by estimation, is 60 to 65%. The  left ventricle has normal function. The left ventricle has no regional  wall motion abnormalities. There is mild left ventricular hypertrophy.  Left ventricular diastolic parameters  are indeterminate.   2. Right ventricular systolic function is normal. The right ventricular  size is normal. There is normal  pulmonary artery systolic pressure.   3. The mitral valve is normal in structure. Trivial mitral valve  regurgitation. No evidence of mitral stenosis.   4. The aortic valve was not well visualized. Aortic valve regurgitation  is mild. No aortic stenosis is present.   05/2020 echo IMPRESSIONS     1. Left ventricular ejection fraction, by estimation, is 60 to 65%. The  left ventricle has normal function. The left ventricle has no regional  wall motion abnormalities. Left ventricular diastolic parameters are  indeterminate.   2. Right ventricular systolic function is normal. The right ventricular  size is normal.   3. The mitral valve is normal in structure. No evidence of mitral valve  regurgitation. No evidence of mitral stenosis.   4. The aortic valve has an indeterminant number of cusps. There is mild  calcification of the aortic valve. There is mild thickening of the aortic  valve. Aortic valve regurgitation is trivial. No aortic stenosis is  present.   5. The inferior vena cava is normal in size with greater than 50%  respiratory variability, suggesting right atrial pressure of 3 mmHg.   05/2020 ABI Summary:  Right: Resting right ankle-brachial index indicates moderate right lower  extremity arterial disease. The right toe-brachial index is abnormal. RT  great toe pressure = 74 mmHg.   Left: Resting left ankle-brachial index indicates moderate left lower  extremity arterial disease. The left toe-brachial index is abnormal. LT  Great toe pressure = 73 mmHg.     Assessment and Plan  1. CAD - no recent cardiac chest pains, recent stress test was benign - continue current meds   2. Chronic diastolic HF - euvolemic, continue current meds   3. COPD - fairly significant chronic symptoms, ongoing increased symptoms since COVID admission mid June - refer to pulmonary in [place] - encouraged her to touch base with pcp about getting a new nebulizer as current one not  working   4. Afib - rates at goal, continue current meds      Arnoldo Lenis, M.D.

## 2020-10-13 NOTE — Patient Instructions (Addendum)
Medication Instructions:  Your physician recommends that you continue on your current medications as directed. Please refer to the Current Medication list given to you today.  Labwork: none  Testing/Procedures: none  Follow-Up: Your physician recommends that you schedule a follow-up appointment in: 6 months You have been referred to [facility]  Any Other Special Instructions Will Be Listed Below (If Applicable).  If you need a refill on your cardiac medications before your next appointment, please call your pharmacy.

## 2020-10-14 DIAGNOSIS — F419 Anxiety disorder, unspecified: Secondary | ICD-10-CM | POA: Diagnosis not present

## 2020-10-14 DIAGNOSIS — I5032 Chronic diastolic (congestive) heart failure: Secondary | ICD-10-CM | POA: Diagnosis not present

## 2020-10-14 DIAGNOSIS — L89322 Pressure ulcer of left buttock, stage 2: Secondary | ICD-10-CM | POA: Diagnosis not present

## 2020-10-14 DIAGNOSIS — Z9181 History of falling: Secondary | ICD-10-CM | POA: Diagnosis not present

## 2020-10-14 DIAGNOSIS — I2510 Atherosclerotic heart disease of native coronary artery without angina pectoris: Secondary | ICD-10-CM | POA: Diagnosis not present

## 2020-10-14 DIAGNOSIS — I130 Hypertensive heart and chronic kidney disease with heart failure and stage 1 through stage 4 chronic kidney disease, or unspecified chronic kidney disease: Secondary | ICD-10-CM | POA: Diagnosis not present

## 2020-10-14 DIAGNOSIS — I5033 Acute on chronic diastolic (congestive) heart failure: Secondary | ICD-10-CM | POA: Diagnosis not present

## 2020-10-14 DIAGNOSIS — Z7901 Long term (current) use of anticoagulants: Secondary | ICD-10-CM | POA: Diagnosis not present

## 2020-10-14 DIAGNOSIS — I252 Old myocardial infarction: Secondary | ICD-10-CM | POA: Diagnosis not present

## 2020-10-14 DIAGNOSIS — E1122 Type 2 diabetes mellitus with diabetic chronic kidney disease: Secondary | ICD-10-CM | POA: Diagnosis not present

## 2020-10-14 DIAGNOSIS — E1151 Type 2 diabetes mellitus with diabetic peripheral angiopathy without gangrene: Secondary | ICD-10-CM | POA: Diagnosis not present

## 2020-10-14 DIAGNOSIS — Z87891 Personal history of nicotine dependence: Secondary | ICD-10-CM | POA: Diagnosis not present

## 2020-10-14 DIAGNOSIS — I700 Atherosclerosis of aorta: Secondary | ICD-10-CM | POA: Diagnosis not present

## 2020-10-14 DIAGNOSIS — E1142 Type 2 diabetes mellitus with diabetic polyneuropathy: Secondary | ICD-10-CM | POA: Diagnosis not present

## 2020-10-14 DIAGNOSIS — J440 Chronic obstructive pulmonary disease with acute lower respiratory infection: Secondary | ICD-10-CM | POA: Diagnosis not present

## 2020-10-14 DIAGNOSIS — N184 Chronic kidney disease, stage 4 (severe): Secondary | ICD-10-CM | POA: Diagnosis not present

## 2020-10-14 DIAGNOSIS — F32A Depression, unspecified: Secondary | ICD-10-CM | POA: Diagnosis not present

## 2020-10-14 DIAGNOSIS — J441 Chronic obstructive pulmonary disease with (acute) exacerbation: Secondary | ICD-10-CM | POA: Diagnosis not present

## 2020-10-14 DIAGNOSIS — N1832 Chronic kidney disease, stage 3b: Secondary | ICD-10-CM | POA: Diagnosis not present

## 2020-10-14 DIAGNOSIS — I480 Paroxysmal atrial fibrillation: Secondary | ICD-10-CM | POA: Diagnosis not present

## 2020-10-14 DIAGNOSIS — M1030 Gout due to renal impairment, unspecified site: Secondary | ICD-10-CM | POA: Diagnosis not present

## 2020-10-20 DIAGNOSIS — J441 Chronic obstructive pulmonary disease with (acute) exacerbation: Secondary | ICD-10-CM | POA: Diagnosis not present

## 2020-10-20 DIAGNOSIS — R300 Dysuria: Secondary | ICD-10-CM | POA: Diagnosis not present

## 2020-10-20 DIAGNOSIS — I700 Atherosclerosis of aorta: Secondary | ICD-10-CM | POA: Diagnosis not present

## 2020-10-20 DIAGNOSIS — I5032 Chronic diastolic (congestive) heart failure: Secondary | ICD-10-CM | POA: Diagnosis not present

## 2020-10-20 DIAGNOSIS — L89322 Pressure ulcer of left buttock, stage 2: Secondary | ICD-10-CM | POA: Diagnosis not present

## 2020-10-20 DIAGNOSIS — E1142 Type 2 diabetes mellitus with diabetic polyneuropathy: Secondary | ICD-10-CM | POA: Diagnosis not present

## 2020-10-20 DIAGNOSIS — F419 Anxiety disorder, unspecified: Secondary | ICD-10-CM | POA: Diagnosis not present

## 2020-10-20 DIAGNOSIS — I252 Old myocardial infarction: Secondary | ICD-10-CM | POA: Diagnosis not present

## 2020-10-20 DIAGNOSIS — I130 Hypertensive heart and chronic kidney disease with heart failure and stage 1 through stage 4 chronic kidney disease, or unspecified chronic kidney disease: Secondary | ICD-10-CM | POA: Diagnosis not present

## 2020-10-20 DIAGNOSIS — I2510 Atherosclerotic heart disease of native coronary artery without angina pectoris: Secondary | ICD-10-CM | POA: Diagnosis not present

## 2020-10-20 DIAGNOSIS — M1030 Gout due to renal impairment, unspecified site: Secondary | ICD-10-CM | POA: Diagnosis not present

## 2020-10-20 DIAGNOSIS — I480 Paroxysmal atrial fibrillation: Secondary | ICD-10-CM | POA: Diagnosis not present

## 2020-10-20 DIAGNOSIS — F32A Depression, unspecified: Secondary | ICD-10-CM | POA: Diagnosis not present

## 2020-10-20 DIAGNOSIS — Z9181 History of falling: Secondary | ICD-10-CM | POA: Diagnosis not present

## 2020-10-20 DIAGNOSIS — E1122 Type 2 diabetes mellitus with diabetic chronic kidney disease: Secondary | ICD-10-CM | POA: Diagnosis not present

## 2020-10-20 DIAGNOSIS — Z7901 Long term (current) use of anticoagulants: Secondary | ICD-10-CM | POA: Diagnosis not present

## 2020-10-20 DIAGNOSIS — R350 Frequency of micturition: Secondary | ICD-10-CM | POA: Diagnosis not present

## 2020-10-20 DIAGNOSIS — E1151 Type 2 diabetes mellitus with diabetic peripheral angiopathy without gangrene: Secondary | ICD-10-CM | POA: Diagnosis not present

## 2020-10-20 DIAGNOSIS — N184 Chronic kidney disease, stage 4 (severe): Secondary | ICD-10-CM | POA: Diagnosis not present

## 2020-10-25 DIAGNOSIS — L89322 Pressure ulcer of left buttock, stage 2: Secondary | ICD-10-CM | POA: Diagnosis not present

## 2020-10-28 ENCOUNTER — Telehealth: Payer: Self-pay | Admitting: Cardiology

## 2020-10-28 NOTE — Telephone Encounter (Signed)
Pt c/o swelling:  If swelling, where is the swelling located? In the knee area   How much weight have you gained and in what time span? Weights are below   Have you gained 3 pounds in a day or 5 pounds in a week? yes  Do you have a log of your daily weights (if so, list)? 170 ,172, 176, 173 7/11 thru 7/14  Are you currently taking a fluid pill? yes  Are you currently SOB? Yes  Have you traveled recently? No    [phone] (TINA)

## 2020-10-28 NOTE — Progress Notes (Signed)
Referring Provider: Rosalee Kaufman, * Primary Care Physician:  Rosalee Kaufman, PA-C Primary GI Physician: Dr. Gala Romney  Chief Complaint  Patient presents with   Abdominal Pain    Right side, knot   Hemorrhoids    bleeding    HPI:   [name] is a 87 y.o. female presenting today for follow-up.  History of IDA noted in July 2021, GERD, dysphagia previously improved by empiric dilations, chronic intermittent low-volume hematochezia in the setting of hemorrhoids, adenomatous colon polyps, chronic intermittent diarrhea in the setting of lactose intolerance, but noticed increased rectal bleeding and watery diarrhea in December 2021 with associated RLQ and suprapubic abdominal pain.  C. difficile and GI pathogen panel negative.  TSH normal.  Colonoscopy in March 2022 with external hemorrhoids, left buttock pressure sore, normal-appearing TI, normal-appearing colonic mucosa s/p biopsied, internal hemorrhoids.  Pathology revealed mild acute/subacute nonspecific colitis with differential including ischemia, drug effect, and infection.  Recommended avoiding NSAIDs and trial of Entocort 9 mg daily. EGD also on file from November 2021 with normal esophagus, erythematous mucosa in the stomach but pathology consistent with mild gastropathy and negative for H. pylori, normal examined duodenum.  At the time of her last office visit in April 2022, she was still taking Entocort daily and noted significant improvement in her diarrhea.  Denied BRBPR or melena.  She did report ongoing pain across her lower abdomen x6 months that worsening in more persistent.  Worsened with movement and not affected by meals or bowel movements.  Kept her up at night.  Also with dysuria and trouble emptying her bladder.  Noted chronic back pain that did wrap around her abdomen at times and radiate down her legs bilaterally.  Denied NSAIDs.  GERD remains well controlled on PPI.  On exam, she had moderate TTP across lower  abdomen without rebound or guarding.  Also with TTP of essentially entire mid to low back, primarily paraspinal.  Plan to update labs, CT A/P, continue Entocort and would discuss with Dr. Gala Romney, continue PPI twice daily, consider Givens capsule for further evaluation of IDA if CT is negative.  -Spoke with Dr. Gala Romney.  Plan to complete 8-week course of Entocort 9 mg daily, then decrease to 6 mg x 2 weeks, then 3 mg x 2 weeks, then stop.  Let us know of any recurrent diarrhea.  Labs completed 08/04/20 with WBC 11.4 (H), hemoglobin 9.6 (L, but stable), LFTs normal, ferritin 9 (L), saturation 3% (L), iron 18 (L), UA and urine culture positive for UTI with E. coli. -Recommend resuming iron daily rather than twice daily due to concerns about constipation, proceeding with Givens capsule with agile first, and treat UTI with Bactrim DS.  CT A/P with contrast 08/05/2020: No acute findings.  Moderate amount of gas and stool within the colon.  Aortic atherosclerosis with max diameter of infrarenal abdominal aorta 3.1 cm with recommendations for ultrasound every 3 years. -Suspected lower abdominal pain multifactorial with MSK, UTI, and constipation. Recommended MiraLAX daily and follow-up with PCP for surveillance ultrasounds.  CT was CCed to PCP.  Patient had agile capsule completed which made to the cecum.  She was scheduled for Givens capsule on 6/29.  Ultimately, this was rescheduled to 11/10/2020 as she was hospitalized in June with A. fib with RVR and COVID-19 infection.   Today:  Presents today with her daughter who she has been living with since being discharged from the hospital in June.  Lower abdominal pain: Continues with  RLQ abdominal pain, but states it is worse now than it has ever been.  Constant.  States she developed a knot in the right lower quadrant that she noticed 2 weeks ago.  States her home nurse felt the area and also reported she had a knot there.  Symptoms are worse with movement.  States  she can barely walk because with every step, she has significant pain.  Also notices pain without movement.  She can be sitting on the couch and have sudden severe pain.  Daughter states she will scream out at times because of this.  Not affected by meals or bowel habits.  Currently taking 2 Tylenol every 6 hours.  She was recently treated for UTI with Bactrim and denies any urinary symptoms at this time.  Fell back in April.  Also with chronic back pain, but feels this pain is different.  It does radiate into her groin as well.  Denies fever or chills.  Bms daily. Formed. No diarrhea. Still taking Entocort 3 mg daily. Has a hemorrhoid that is bleeding with every BM. Has been on an off, some days are worse than others. Using hydrocortisone cream BID without much improvement. Continues to have a pressure sore. States it is getting better.   GERD: Well controlled.  When she got sick with COVID and had a lot of sinus issues, she started feeling food trying to stick in her throat.  As her sinus symptoms are improving, this is also improving.  IDA: Givens scheduled for 7/27.  Most recent hemoglobin on 10/12/2020 was 10.4, stable/improved.  Reports she had blood work completed on Tuesday with primary care.  They were told everything looked pretty good but her iron panel is low.  Not taking iron at this time.  She stopped iron when she was originally supposed to have the capsule study in June and did not realize she was supposed to start this back.  No black stools.   IDA. Received 2 unit PRBCs in October 2021 due to hemoglobin as low as 6.7. EGD 02/16/2020 with normal examined esophagus s/p dilation, erythematous mucosa in the stomach biopsied (mild gastropathy, negative H. pylori), normal examined duodenum.  Past Medical History:  Diagnosis Date   Anxiety    Arthritis    Atrial fibrillation (HCC)    Bursitis    Left shoulder   Cataract    CHF (congestive heart failure) (HCC)    CKD (chronic kidney  disease)    stage 3-4   COPD (chronic obstructive pulmonary disease) (HCC)    Coronary atherosclerosis of native coronary artery    a. s/p DES to LCx in 04/2013 b. cath in 11/2015 showing patent stent with 20% prox-LAD and 80% ostial RCA stenosis for which medical management was recommended due to small artery size   Depression    Diastolic heart failure (HCC)    EF 55-60%   Dysphagia, unspecified(787.20)    Dyspnea    Dysrhythmia    Essential hypertension    GERD (gastroesophageal reflux disease)    Hx Schatzki's ring, multiple EGD/ED last 01/06/2004   Gout    Headache    History of anemia    Hyperlipidemia    Internal hemorrhoids without mention of complication    MI (myocardial infarction) ([place]) 2006   Microscopic colitis 2003   Panic disorder without agoraphobia    Paresthesia    Pneumonia 12/2011   PVD (peripheral vascular disease) (HCC)    S/P colonoscopy 09/27/2001   internal hemorrhoids, desc  colon inflam polyp, SB BX-chronic duodenitis, colitis   Sleep apnea    Thyroid disease     Past Surgical History:  Procedure Laterality Date   ABDOMINAL HYSTERECTOMY     ABDOMINAL HYSTERECTOMY     AGILE CAPSULE N/A 08/30/2020   Procedure: AGILE CAPSULE;  Surgeon: Daneil Dolin, MD;  [facility];  Service: Endoscopy;  Laterality: N/A;  7:30am   ANTERIOR AND POSTERIOR REPAIR     with resection of vagina   ANTERIOR LAT LUMBAR FUSION N/A 08/01/2016   Procedure: Lumbar Two-Lumbar Five Transpsoas lateral interbody fusion with Lumbar Two-Three lateral plate fixation;  Surgeon: Kevan Ny Ditty, MD;  Location: [place];  Service: Neurosurgery;  Laterality: N/A;  L2-5 Transpsoas lateral interbody fusion with L2-3 lateral plate fixation   APPENDECTOMY     BACK SURGERY     BIOPSY  07/05/2015   Procedure: BIOPSY;  Surgeon: Daneil Dolin, MD;  [facility];  Service: Endoscopy;;  gastric polyp biopsy, ascending colon biopsy   BIOPSY  02/16/2020   Procedure: BIOPSY;   Surgeon: Daneil Dolin, MD;  [facility];  Service: Endoscopy;;   BIOPSY  06/28/2020   Procedure: BIOPSY;  Surgeon: Daneil Dolin, MD;  [facility];  Service: Endoscopy;;   BLADDER SUSPENSION  11/09/2011   Procedure: TRANSVAGINAL TAPE (TVT) PROCEDURE;  Surgeon: Marissa Nestle, MD;  Location: [facility];  Service: Urology;  Laterality: N/A;   bladder tack  06/2010   BREAST LUMPECTOMY  1998   left, benign   CARDIAC CATHETERIZATION     CARDIAC CATHETERIZATION     CARDIAC CATHETERIZATION N/A 12/16/2015   Procedure: Left Heart Cath and Coronary Angiography;  Surgeon: Troy Sine, MD;  Location: [place] [facility];  Service: Cardiovascular;  Laterality: N/A;   CARDIOVERSION N/A 10/04/2017   Procedure: CARDIOVERSION;  Surgeon: Herminio Commons, MD;  [facility]: [facility];  Service: Cardiovascular;  Laterality: N/A;   CARDIOVERSION N/A 01/30/2018   Procedure: CARDIOVERSION;  Surgeon: Herminio Commons, MD;  [facility];  Service: Cardiovascular;  Laterality: N/A;   CARDIOVERSION N/A 11/10/2019   Procedure: CARDIOVERSION;  Surgeon: Josue Hector, MD;  Location: [facility];  Service: Cardiovascular;  Laterality: N/A;   [place]   left   cataract surgery     CHOLECYSTECTOMY  1998   Cholecystectomy     COLONOSCOPY  03/16/2011   multiple hyperplastic colon polyps, sigmoid diverticulosis, melanosis coli   COLONOSCOPY WITH PROPOFOL N/A 07/05/2015   RMR:one 5 mm polyp in descending colon   COLONOSCOPY WITH PROPOFOL N/A 06/28/2020   Procedure: COLONOSCOPY WITH PROPOFOL;  Surgeon: Daneil Dolin, MD;  [facility];  Service: Endoscopy;  Laterality: N/A;  am appt   CORONARY ANGIOGRAPHY N/A 05/16/2018   Procedure: CORONARY ANGIOGRAPHY (CATH LAB);  Surgeon: Belva Crome, MD;  Location: [place] [facility];  Service: Cardiovascular;  Laterality: N/A;   CORONARY ANGIOPLASTY WITH STENT PLACEMENT  2015   ESOPHAGEAL DILATION N/A 07/05/2015    Procedure: ESOPHAGEAL DILATION;  Surgeon: Daneil Dolin, MD;  [facility];  Service: Endoscopy;  Laterality: N/A;   ESOPHAGOGASTRODUODENOSCOPY (EGD) WITH PROPOFOL N/A 07/05/2015   OIZ:[id number]   ESOPHAGOGASTRODUODENOSCOPY (EGD) WITH PROPOFOL N/A 02/16/2020   Procedure: ESOPHAGOGASTRODUODENOSCOPY (EGD) WITH PROPOFOL;  Surgeon: Daneil Dolin, MD;  Normal examined esophagus s/p dilation, erythematous mucosa in the stomach s/p biopsy, normal examined duodenum. Pathology with mild gastropathy, negative  for H. Pylori.    JOINT REPLACEMENT Right 2007   right knee   left hand surgery     LEFT HEART CATHETERIZATION WITH CORONARY ANGIOGRAM N/A 05/14/2013   Procedure: LEFT HEART CATHETERIZATION WITH CORONARY ANGIOGRAM;  Surgeon: Blane Ohara, MD;  Location: [facility] C[facility] LAB;  Service: Cardiovascular;  Laterality: N/A;   left rotator cuff surgery     LUMBAR LAMINECTOMY/DECOMPRESSION MICRODISCECTOMY N/A 10/11/2012   Procedure: LUMBAR LAMINECTOMY/DECOMPRESSION MICRODISCECTOMY 2 LEVELS;  Surgeon: Floyce Stakes, MD;  Location: [place] [facility];  Service: Neurosurgery;  Laterality: N/A;  L3-4 L4-5 Laminectomy   LUMBAR WOUND DEBRIDEMENT N/A 09/27/2015   Procedure: Exploration of Lumbar Wound w/ Repair CSF Leak/Lumbar Drain Placement;  Surgeon: Leeroy Cha, MD;  Location: [place] [facility];  Service: Neurosurgery;  Laterality: N/A;   MALONEY DILATION  03/16/2011   Gastritis. No H.pylori on bx. 38F maloney dilation with disruption of  occult cevical esophageal web   MALONEY DILATION N/A 02/16/2020   Procedure: MALONEY DILATION;  Surgeon: Daneil Dolin, MD;  [facility];  Service: Endoscopy;  Laterality: N/A;   NASAL SINUS SURGERY     right knee replacement  2007   right leg benign tumor     SHOULDER SURGERY Left    TEE WITHOUT CARDIOVERSION N/A 10/04/2017   Procedure: TRANSESOPHAGEAL ECHOCARDIOGRAM (TEE) WITH PROPOFOL;  Surgeon: Herminio Commons, MD;  Location[facility];  Service:  Cardiovascular;  Laterality: N/A;   TEE WITHOUT CARDIOVERSION N/A 11/10/2019   Procedure: TRANSESOPHAGEAL ECHOCARDIOGRAM (TEE);  Surgeon: Josue Hector, MD;  Location: [facility] ENDOSCOPY;  Service: Cardiovascular;  Laterality: N/A;   TONSILLECTOMY     unspecified area, hysterectomy  1972   partial    Current Outpatient Medications  Medication Sig Dispense Refill   acetaminophen (TYLENOL) 500 MG tablet Take 500 mg by mouth every 6 (six) hours as needed for headache or moderate pain.     ALPRAZolam (XANAX) 0.5 MG tablet Take 0.5 mg by mouth daily as needed for anxiety or sleep.     apixaban (ELIQUIS) 5 MG TABS tablet Take 1 tablet (5 mg total) by mouth 2 (two) times daily. 180 tablet 3   ascorbic acid (VITAMIN C) 500 MG tablet Take 1 tablet (500 mg total) by mouth daily. 30 tablet 2   Budeson-Glycopyrrol-Formoterol (BREZTRI AEROSPHERE) 160-9-4.8 MCG/ACT AERO Inhale 2 puffs into the lungs in the morning and at bedtime.     diltiazem (CARDIZEM CD) 120 MG 24 hr capsule TAKE ONE CAPSULE BY MOUTH DAILY 90 capsule 1   DULoxetine (CYMBALTA) 60 MG capsule Take 60 mg by mouth at bedtime.     ferrous sulfate 325 (65 FE) MG tablet Take 1 tablet (325 mg total) by mouth daily with breakfast. 30 tablet 3   fluticasone (CUTIVATE) 0.05 % cream Apply 1 application topically 2 (two) times daily as needed (skin tears).     furosemide (LASIX) 40 MG tablet TAKE 20 MG EVERY OTHER DAY ALTERNATING WITH 40 MG EVERY OTHER DAY 135 tablet 1   gabapentin (NEURONTIN) 100 MG capsule Take 100 mg by mouth at bedtime.     guaiFENesin (MUCINEX) 600 MG 12 hr tablet 600 mg in the morning and at bedtime.     guaiFENesin-dextromethorphan (ROBITUSSIN DM) 100-10 MG/5ML syrup Take 5 mLs by mouth every 4 (four) hours as needed for cough (chest congestion). 118 mL 0   hydrocortisone (ANUSOL-HC) 2.5 % rectal cream Place 1 application rectally 2 (two) times daily. 30 g 1  isosorbide mononitrate (IMDUR) 60 MG 24 hr tablet Take 60 mg by mouth  daily.     magnesium oxide (MAG-OX) 400 MG tablet TAKE ONE TABLET BY MOUTH DAILY. 90 tablet 3   metoprolol succinate (TOPROL-XL) 25 MG 24 hr tablet Take 75 mg by mouth daily.     Multiple Vitamins-Minerals (HAIR/SKIN/NAILS) CAPS Take 1 tablet by mouth daily.     Naphazoline HCl (CLEAR EYES OP) Place 1 drop into both eyes daily as needed (itching).     nitroGLYCERIN (NITROSTAT) 0.4 MG SL tablet Place 1 tablet (0.4 mg total) under the tongue every 5 (five) minutes as needed for chest pain. Reported on 08/04/2015 25 tablet 6   pantoprazole (PROTONIX) 40 MG tablet TAKE ONE TABLET BY MOUTH TWICE DAILY BEFORE MEALS 60 tablet 3   potassium chloride (KLOR-CON) 10 MEQ tablet Take 1 tablet (10 mEq total) by mouth daily. 180 tablet 1   triamcinolone cream (KENALOG) 0.1 % Apply topically 2 (two) times daily.     zinc sulfate 220 (50 Zn) MG capsule Take 1 capsule (220 mg total) by mouth daily. 30 capsule 3   No current facility-administered medications for this visit.   Facility-Administered Medications Ordered in Other Visits  Medication Dose Route Frequency Provider Last Rate Last Admin   iohexol (OMNIPAQUE) 9 MG/ML oral solution             Allergies as of 10/29/2020 - Review Complete 10/29/2020  Allergen Reaction Noted   Cephalosporins Diarrhea and Nausea Only 08/04/2015   Levaquin [levofloxacin in d5w] Swelling 07/06/2014   Macrodantin [nitrofurantoin macrocrystal] Swelling 07/06/2014   Phenothiazines Anaphylaxis and Hives 08/16/2011   Polysorbate Anaphylaxis 08/16/2011   Prednisone Shortness Of Breath    Buspirone Itching 07/06/2014   Cardura [doxazosin mesylate] Itching 07/06/2014   Codeine Itching    Acyclovir and related Itching 10/07/2015   Colcrys [colchicine] Nausea Only 10/04/2018   Prochlorperazine Other (See Comments) 08/16/2011   Ranexa [ranolazine]  04/06/2015   Atorvastatin Hives 08/16/2011   Colestipol Palpitations 05/03/2020   Ofloxacin Rash    Other Itching and Rash  10/10/2012   Penicillins Other (See Comments) 11/09/2011   Pimozide Hives and Itching 08/16/2011    Family History  Problem Relation Age of Onset   Stroke Mother    Parkinson's disease Father    Coronary artery disease Other        family Hx-sons   Cancer Other    Stroke Other        family Hx   Hypertension Other        family Hx   Diabetes Brother    Heart disease Son        before age 66   Diabetes Son    Stroke Daughter 88   Colon cancer Grandson        diagnosed 31   Inflammatory bowel disease Neg Hx     Social History   Socioeconomic History   Marital status: Divorced    Spouse name: Not on file   Number of children: 5   Years of education: Not on file   Highest education level: Not on file  Occupational History   Occupation: retired  Tobacco Use   Smoking status: Former    Packs/day: 1.00    Years: 64.00    Pack years: 64.00    Types: Cigarettes    Start date: 12/24/1947    Quit date: 11/17/2001    Years since quitting: 18.9   Smokeless tobacco: Never  Tobacco comments:    Quit smoking in 2003  Vaping Use   Vaping Use: Never used  Substance and Sexual Activity   Alcohol use: Not Currently   Drug use: No   Sexual activity: Never  Other Topics Concern   Not on file  Social History Narrative   Divorced.   Sister had colon perforation & died from complications in [place], [place]   Social Determinants of Health   Financial Resource Strain: Not on file  Food Insecurity: Not on file  Transportation Needs: Not on file  Physical Activity: Not on file  Stress: Not on file  Social Connections: Not on file    Review of Systems: Gen: Denies fever, chills, cold or flulike symptoms, presyncope, syncope.[name] Kitchen  GI: See HPI Heme: See HPI  Physical Exam: BP 139/65   Pulse 88   Temp 97.7 F (36.5 C) (Temporal)   Ht 5\' 1"  (1.549 m)   Wt 174 lb (78.9 kg)   BMI 32.88 kg/m  General:   Alert and oriented. No distress noted. Pleasant and cooperative.  Head:   Normocephalic and atraumatic. Eyes:  Conjuctiva clear without scleral icterus. Heart:  S1, S2 present without murmurs appreciated. Lungs:  Clear to auscultation bilaterally. No wheezes, rales, or rhonchi. No distress.  Abdomen:  +BS, soft, non-tender and non-distended. No rebound or guarding. No HSM or masses noted. Msk:  Symmetrical without gross deformities. Normal posture. Extremities:  Without edema. Neurologic:  Alert and  oriented x4 Psych: Normal mood and affect.    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  RLQ abdominal pain: Greater than 21-month history of persistent lower abdominal pain that started out mild and has been progressing since that time, now worse than it has every been and reports a "knot" in the RLQ.  Colonoscopy March 2022 with normal-appearing colonic mucosa s/p biopsies revealing indeterminate colitis with associated diarrhea that has improved with Entocort.  CT A/P with contrast April 2022 with no acute findings.  Most recent CT A/P without contrast 6/15 with no acute process.  She has been treated for UTI x2 and denies any urinary symptoms.  Today, she reports pain is worse now than it has ever been, constant, cannot sleep.  Worse with movement, but also occurs at rest.  Denies nausea, vomiting, fever, chills, or symptoms affected by meals or bowel movements.  She is having rectal bleeding with bowel movements which is a chronic issue in the setting of known hemorrhoids.  Patient was in significant pain when getting on exam table and hollered out.  On exam, she has significant tenderness to palpation in the right lower quadrant and moving into her groin.   I do feel some subcutaneous nodularity though it does not feel like a typical hernia.  History of appendectomy and hysterectomy.  Etiology of pain is not clear.  I am less suspicious for intra-abdominal process such as diverticulitis or incarcerated hernia, but with such severe pain today, I can't rule this out and she will need repeat CT STAT for further evaluation.  If CT is unremarkable, I recommended further evaluation by PCP for MSK/neurologic etiology.  Would recommend evaluation of her hip and back.  May benefit from x-rays and/or MRI.  Would leave this to PCP.  Bleeding  hemorrhoids: Chronic history of intermittent rectal bleeding in the setting of known internal and external hemorrhoids, on Eliquis.  Colonoscopy up-to-date in March 2022.  Symptoms have been fairly well managed with hydrocortisone rectal cream, but this no longer seems to be helping.  Denies constipation or passage of hard stools. Though she has had ongoing rectal bleeding, her hemoglobin seems to be holding steady with most recent hemoglobin 10.4 on 6/28.  Suspect she may need hemorrhoid banding of internal hemorrhoids.  Will discuss with Roseanne Kaufman if this is an option in our office due to anticoagulation and external hemorrhoids.  She may need referral to general surgery.  Diarrhea/indeterminate colitis: Colonoscopy March 2022 pursued due to watery diarrhea revealed normal appearing colonic mucosa, but colon biopsies with nonspecific colitis.  She was empirically started on Entocort 9 mg daily x8 weeks which was to be tapered to 6 mg x 2 weeks then 3 mg x 2 weeks.  However, she has continued on 3 mg daily.  Encouragingly, diarrhea has resolved.  Advised discontinuing Entocort and monitoring for return of diarrhea.  GERD: Well-controlled on PPI twice daily.  IDA: Diagnosed in July 2021 and required 2 unit PRBC transfusion October 2021 with hemoglobin as low as 6.7 at that time.  Chronic intermittent rectal bleeding in the setting  of known hemorrhoids, but without any significant change at that time.  No melena.  No NSAIDs. EGD November 2021 revealing mild gastropathy, negative for H. pylori.  Colonoscopy March 2022 with external and internal hemorrhoids, normal-appearing TI and colonic mucosa though colon biopsies revealed nonspecific colitis s/p empiric treatment with Entocort and improvement in diarrhea.  Currently scheduled for Givens capsule on 7/27 to complete GI evaluation. Continues with rectal bleeding as per above. Most recent labs on file 6/28 with hemoglobin improved/stable at 10.4. Patient reports having labs completed again 7/12 with PCP and was told her iron panel was low. Had resumed oral iron in April 2022, but patient discontinued in June.  Overall, suspect IDA is likely multifactorial with chronic anticoagulation, chronic GI blood loss from hemorrhoids, can't rule out occult bleed from small bowel, anemia of chronic disease.  We will request recent labs from PCP, advised to resume iron, and proceed with Givens capsule as planned.   Plan: 1.  Proceed with CT A/P with contrast (reduced if needed) stat. 2.  If CT is unrevealing, patient needs to follow-up with PCP for further evaluation of possible MSK etiology of reported abdominal pain.  May need x-rays and/or MRI. 3.  Proceed with Givens capsule as planned. 4.  Continue Anusol rectal cream.  Use 3-4 times daily. 5.  Discuss with Roseanne Kaufman, NP about candidacy for hemorrhoid banding.  May not be a candidate for banding in the setting of anticoagulation.  Suspect she will need referral to general surgery. 6.  Limit toilet time to 2-3 minutes and avoid straining. 7.  Stop budesonide and monitor for return of diarrhea. 8.  Resume iron daily, but hold x7 days prior to upcoming Givens capsule. 9.  Request recent blood work from PCP for review. 10.  Continue Protonix 40 mg twice daily.    Aliene Altes, PA-C [facility] 10/29/2020

## 2020-10-28 NOTE — Telephone Encounter (Signed)
Spoke with daughter Otila Kluver) - patient just had OV on 10/13/2020 - these symptoms started over the weekend   Weight at [place] 6/29 - 166lb  Weight today was 173lb  Is taking the Furosemide 40/20mg  alternating every other day.    SOB - increased with activity.    Does have Pulmonary visit scheduled with Dr. Melvyn Novas on 11/02/2020.

## 2020-10-28 NOTE — Telephone Encounter (Signed)
Take lasix to 40mg  daily until Monday and then call and update [place] on weights and symptoms  J Carmeron Heady MD

## 2020-10-29 ENCOUNTER — Other Ambulatory Visit: Payer: Self-pay

## 2020-10-29 ENCOUNTER — Ambulatory Visit (HOSPITAL_COMMUNITY)
Admission: RE | Admit: 2020-10-29 | Discharge: 2020-10-29 | Disposition: A | Discharge status: Home / Self Care | Payer: PPO | Source: Ambulatory Visit | Attending: Gastroenterology | Admitting: Gastroenterology

## 2020-10-29 ENCOUNTER — Ambulatory Visit: Payer: PPO | Admitting: Gastroenterology

## 2020-10-29 ENCOUNTER — Encounter: Payer: Self-pay | Admitting: Gastroenterology

## 2020-10-29 VITALS — BP 139/65 | HR 88 | Temp 97.7°F | Ht 61.0 in | Wt 174.0 lb

## 2020-10-29 DIAGNOSIS — J449 Chronic obstructive pulmonary disease, unspecified: Secondary | ICD-10-CM | POA: Diagnosis not present

## 2020-10-29 DIAGNOSIS — R1031 Right lower quadrant pain: Secondary | ICD-10-CM

## 2020-10-29 DIAGNOSIS — K649 Unspecified hemorrhoids: Secondary | ICD-10-CM

## 2020-10-29 DIAGNOSIS — R197 Diarrhea, unspecified: Secondary | ICD-10-CM

## 2020-10-29 DIAGNOSIS — D649 Anemia, unspecified: Secondary | ICD-10-CM

## 2020-10-29 DIAGNOSIS — K219 Gastro-esophageal reflux disease without esophagitis: Secondary | ICD-10-CM | POA: Diagnosis not present

## 2020-10-29 MED ORDER — IOHEXOL 9 MG/ML PO SOLN
ORAL | Status: AC
  Filled 2020-10-29: qty 1000

## 2020-10-29 MED ORDER — IOHEXOL 300 MG/ML  SOLN
100.0000 mL | Freq: Once | INTRAMUSCULAR | Status: AC | PRN
  Administered 2020-10-29: 100 mL via INTRAVENOUS

## 2020-10-29 NOTE — Patient Instructions (Addendum)
I am sorry you are not feeling well!  We are arranging for you to have a CT scan of your abdomen and pelvis today at [facility].  I will call you with your results this evening.   You may stop budesonide and monitor for return of diarrhea.  Resume iron daily.  Hold for 7 days prior to your upcoming procedure on 7/27.  I am going to talk with Roseanne Kaufman, NP about hemorrhoid banding in our office.  We will call you with additional recommendations.  Continue using Anusol rectal cream.  Recommend using this 3-4 times daily to see if this will reduce your bleeding.  Limit toilet time to 2-3 minutes.  Avoid straining.  If you feel lightheaded, dizzy, like you will pass out, proceed to the emergency room.  Continue Protonix 40 mg twice daily 30 minutes before breakfast and dinner.   Aliene Altes, PA-C [facility]

## 2020-10-29 NOTE — Telephone Encounter (Signed)
Left message to return call 

## 2020-10-31 DIAGNOSIS — E46 Unspecified protein-calorie malnutrition: Secondary | ICD-10-CM | POA: Diagnosis not present

## 2020-10-31 DIAGNOSIS — I509 Heart failure, unspecified: Secondary | ICD-10-CM | POA: Diagnosis not present

## 2020-10-31 DIAGNOSIS — N179 Acute kidney failure, unspecified: Secondary | ICD-10-CM | POA: Diagnosis not present

## 2020-10-31 DIAGNOSIS — U071 COVID-19: Secondary | ICD-10-CM | POA: Diagnosis not present

## 2020-10-31 DIAGNOSIS — I2510 Atherosclerotic heart disease of native coronary artery without angina pectoris: Secondary | ICD-10-CM | POA: Diagnosis not present

## 2020-10-31 DIAGNOSIS — I4891 Unspecified atrial fibrillation: Secondary | ICD-10-CM | POA: Diagnosis not present

## 2020-10-31 DIAGNOSIS — J449 Chronic obstructive pulmonary disease, unspecified: Secondary | ICD-10-CM | POA: Diagnosis not present

## 2020-11-01 NOTE — Telephone Encounter (Signed)
Left message to return call 

## 2020-11-01 NOTE — Telephone Encounter (Signed)
Daughter Otila Kluver) notified and verbalized understanding.  She will call with update at end of the week.

## 2020-11-02 ENCOUNTER — Ambulatory Visit: Payer: PPO | Admitting: Internal Medicine

## 2020-11-02 ENCOUNTER — Encounter: Payer: Self-pay | Admitting: Internal Medicine

## 2020-11-02 ENCOUNTER — Other Ambulatory Visit: Payer: Self-pay

## 2020-11-02 DIAGNOSIS — R059 Cough, unspecified: Secondary | ICD-10-CM | POA: Diagnosis not present

## 2020-11-02 DIAGNOSIS — R0600 Dyspnea, unspecified: Secondary | ICD-10-CM

## 2020-11-02 DIAGNOSIS — R0609 Other forms of dyspnea: Secondary | ICD-10-CM

## 2020-11-02 MED ORDER — GABAPENTIN 100 MG PO CAPS: 100.0000 mg | ORAL_CAPSULE | Freq: Every day | ORAL | 11 refills | Status: DC

## 2020-11-02 NOTE — Progress Notes (Signed)
Subjective:   Patient ID: [name], female    DOB: 15-Apr-1933   MRN: [id number]    Brief patient profile:  46 yowf healthy as child and good ex tol up to around 2000 then gradual onset  doe >> stopped smoking 2003 some better to point where could walk around quarter [place] track but stop x once typically  s any inhalers but heart attack 2006 and worse since then referred by Dr Quillian Quince to pulmonary clinic 02/12/2014   History of Present Illness   02/12/2014 1st [facility] office visit/ Davyd Podgorski   Chief Complaint  Patient presents with   Pulmonary Consult    Referred by Dr. Phill Mutter. Pt c/o dyspnea since 2007 "when started having heart trouble"- worse since Aug 2015. She states she is SOB all of the time- with or without exertion. She has to sleep propped up on 3 pillows.  She also c/o some hemoptysis recently- relates to sinus congestion and PND.   since Aug 2015 sob at rest sense can't get a breath in worse with walking 50 ft  No better with albuterol  Wt in 2000 was 145  Assoc with dysphagia  not on acei or gerd rx  10 min on ex bike and then legs give out  rec Weight control is simply a matter of calorie balance   GERD (REFLUX)   Take protonix 40mg  Take 30-60 min before first meal of the day and add pepcid ac 20 mg after supper to see if helps breathing or coughing or both >  ? Improved (can't remember)       05/26/2016  Extended f/u ov/[facility] re:re establish re NEW  cough x November 2017/  f/u in [facility] clinic not on ACEi  Chief Complaint  Patient presents with   Follow-up    dyspnea not getting better, cough with crackling, she is unable to produce mucus but when she does it is white/yellow in color  on duoneb tid which seems to help breathing and coughing some Onset was insidious, pattern is progressively worse now 24/7 cough and prior doe now sob at rest if coughing  Worse time for is  hs but even then very minimal mucus production Very confused with meds/ instructions/  cannot do accurate med reconciliatoin   Rec Depomedrol 120 mg IM today  For cough try mucinex 1200 mg every 12 hours  Pantoprazole (protonix) 40 mg   Take  30-60 min before first meal of the day and Pepcid (famotidine)  20 mg one @  bedtime until return to office - this is the best way to tell whether stomach acid is contributing to your problem.   GERD diet/bed blocks  If not better return with all medications in hand   Admitted for covid   11/02/2020   Pulmonary consultation/ uacs on gabapentin 100 mg one daily  Chief Complaint  Patient presents with   Pulmonary Consult    Referred by Dr Daneil Dan. Pt c/o DOE for the past 1-2 years, progressively getting worse. She states can only walks a few steps before she gets winded. She is also c/o PND and cough, sputum getting stuck in her throat.    Dyspnea:  baseline before could chair to bathroom using a walker  Sob at rest /worse when uses voice  Cough: has sensation of too mucus much  Sleeping: maybe 30 degrees hosp bed never sleeping due to cough and sense of pnds SABA use: seems to help x 15 min  02: none Covid status:   never vax     No obvious day to day or daytime variability or assoc excess/ purulent sputum or mucus plugs or hemoptysis or cp or chest tightness, subjective wheeze or overt sinus or hb symptoms.    Also denies any obvious fluctuation of symptoms with weather or environmental changes or other aggravating or alleviating factors except as outlined above   No unusual exposure hx or h/o childhood pna/ asthma or knowledge of premature birth.  Current Allergies, Complete Past Medical History, Past Surgical History, Family History, and Social History were reviewed in [employer] record.  ROS  The following are not active complaints unless bolded Hoarseness, sore throat, dysphagia, dental problems, itching, sneezing,  nasal congestion or discharge of excess mucus or purulent secretions, ear ache,    fever, chills, sweats, unintended wt loss or wt gain, classically pleuritic or exertional cp,  orthopnea pnd or arm/hand swelling  or leg swelling, presyncope, palpitations, abdominal pain, anorexia, nausea, vomiting, diarrhea  or change in bowel habits or change in bladder habits, change in stools or change in urine, dysuria, hematuria,  rash, arthralgias, visual complaints, headache, numbness, weakness or ataxia or problems with walking/uses rollator  or coordination,  change in mood or  memory.        Current Meds  Medication Sig   acetaminophen (TYLENOL) 500 MG tablet Take 500 mg by mouth every 6 (six) hours as needed for headache or moderate pain.   ALPRAZolam (XANAX) 0.5 MG tablet Take 0.5 mg by mouth daily as needed for anxiety or sleep.   apixaban (ELIQUIS) 5 MG TABS tablet Take 1 tablet (5 mg total) by mouth 2 (two) times daily.   ascorbic acid (VITAMIN C) 500 MG tablet Take 1 tablet (500 mg total) by mouth daily.   Budeson-Glycopyrrol-Formoterol (BREZTRI AEROSPHERE) 160-9-4.8 MCG/ACT AERO Inhale 2 puffs into the lungs in the morning and at bedtime.   diltiazem (CARDIZEM CD) 120 MG 24 hr capsule TAKE ONE CAPSULE BY MOUTH DAILY   DULoxetine (CYMBALTA) 60 MG capsule Take 60 mg by mouth at bedtime.   ferrous sulfate 325 (65 FE) MG tablet Take 1 tablet (325 mg total) by mouth daily with breakfast.   fluticasone (CUTIVATE) 0.05 % cream Apply 1 application topically 2 (two) times daily as needed (skin tears).   furosemide (LASIX) 40 MG tablet TAKE 20 MG EVERY OTHER DAY ALTERNATING WITH 40 MG EVERY OTHER DAY   gabapentin (NEURONTIN) 100 MG capsule Take 100 mg by mouth at bedtime.   guaiFENesin (MUCINEX) 600 MG 12 hr tablet 600 mg in the morning and at bedtime.   guaiFENesin-dextromethorphan (ROBITUSSIN DM) 100-10 MG/5ML syrup Take 5 mLs by mouth every 4 (four) hours as needed for cough (chest congestion).   hydrocortisone (ANUSOL-HC) 2.5 % rectal cream Place 1 application rectally 2 (two) times  daily.   isosorbide mononitrate (IMDUR) 60 MG 24 hr tablet Take 60 mg by mouth daily.   magnesium oxide (MAG-OX) 400 MG tablet TAKE ONE TABLET BY MOUTH DAILY.   metoprolol succinate (TOPROL-XL) 25 MG 24 hr tablet Take 75 mg by mouth daily.   Multiple Vitamins-Minerals (HAIR/SKIN/NAILS) CAPS Take 1 tablet by mouth daily.   Naphazoline HCl (CLEAR EYES OP) Place 1 drop into both eyes daily as needed (itching).   nitroGLYCERIN (NITROSTAT) 0.4 MG SL tablet Place 1 tablet (0.4 mg total) under the tongue every 5 (five) minutes as needed for chest pain. Reported on 08/04/2015   pantoprazole (PROTONIX) 40  MG tablet TAKE ONE TABLET BY MOUTH TWICE DAILY BEFORE MEALS   potassium chloride (KLOR-CON) 10 MEQ tablet Take 1 tablet (10 mEq total) by mouth daily.   triamcinolone cream (KENALOG) 0.1 % Apply topically 2 (two) times daily.   zinc sulfate 220 (50 Zn) MG capsule Take 1 capsule (220 mg total) by mouth daily.               Objective:   Physical Exam  11/02/2020        173  05/27/2016        153 02/12/14 190 lb (86.183 kg)  01/13/14 184 lb 3.2 oz (83.553 kg)  12/23/13 184 lb (83.462 kg)      Vital signs reviewed  11/02/2020  - Note at rest 02 sats  99% on RA   General appearance:    depressed obese amb wf with classic pseudowheeze   HEENT : pt wearing mask not removed for exam due to covid -19 concerns.    NECK :  without JVD/Nodes/TM/ nl carotid upstrokes bilaterally   LUNGS: no acc muscle use,  Nl contour chest with trace insp crackles in bases  bilaterally without cough on insp or exp maneuvers   CV:  RRR  no s3 or murmur or increase in P2, and no edema   ABD: obese  soft and nontender with nl inspiratory excursion in the supine position. No bruits or organomegaly appreciated, bowel sounds nl  MS:  slow gait with rollator/  ext warm without deformities, calf tenderness, cyanosis or clubbing No obvious joint restrictions   SKIN: warm and dry without lesions    NEURO:  alert,  approp, nl sensorium with  no motor or cerebellar deficits apparent.                  I personally reviewed images and agree with radiology impression as follows:   Chest CT cuts on abd ct 09/29/20  Mild interstitial fibrosis and mild bronchiectasis in the lung bases.     Assessment & Plan:

## 2020-11-02 NOTE — Assessment & Plan Note (Deleted)
Onset 2000 - 02/12/2014  Walked RA x 1 lap  @ 185 ft each stopped due to  Sob, no desat, moderate pace  - 02/12/2014 spirometry >   FEV1  1.41 (83%) ratio 83 - Echo 05/20/20  Nl x trivial AI  -  11/02/2020   Walked RA  approx   150 ft  @ slow/rollator pace  stopped due to  Sob with sats still 98%     Exam most impressive for psedowheeze/  20 lb wt gain since last eval and obvious deconditioning on walk > rec reconditioning as tol and focus for now on upper airway

## 2020-11-02 NOTE — Assessment & Plan Note (Signed)
Onset Nov 2017 - 05/26/2016 rec max gerd rx/ return with all meds if not improving  Chest CT cuts on abd ct 09/29/20  Mild interstitial fibrosis and mild bronchiectasis in the lung bases.   Of the three most common causes of  Sub-acute / recurrent or chronic cough, only one (GERD)  can actually contribute to/ trigger  the other two (asthma and post nasal drip syndrome)  and perpetuate the cylce of cough and contribute directly to CT findings as above   While not intuitively obvious, many patients with chronic low grade reflux do not cough until there is a primary insult that disturbs the protective epithelial barrier and exposes sensitive nerve endings.   This is typically viral but can due to PNDS and  either may apply here.   The point is that once this occurs, it is difficult to eliminate the cycle  using anything but a maximally effective acid suppression regimen at least in the short run, accompanied by an appropriate diet to address non acid GERD and control / eliminate pnds with 1st gen H1 blockers per guidelines  And stop the cough itself by titrating gabapentin to as high of a dose as it takes to stop all coughing or max of 300 qid and if not better refer to Dr Joya Gaskins at [facility]

## 2020-11-02 NOTE — Patient Instructions (Addendum)
Gabapentin 100 mg twice daily x one week, then three times x one week , then for times daily x one week  adding one extra pill until cough is gone then hold there until return   Pantoprazole 40 mg Take 30- 60 min before your first and last meals of the day   GERD (REFLUX)  is an extremely common cause of respiratory symptoms just like yours , many times with no obvious heartburn at all.    It can be treated with medication, but also with lifestyle changes including elevation of the head of your bed (ideally with 6 -8inch blocks under the headboard of your bed),  Smoking cessation, avoidance of late meals, excessive alcohol, and avoid fatty foods, chocolate, peppermint, colas, red wine, and acidic juices such as orange juice.  NO MINT OR MENTHOL PRODUCTS SO NO COUGH DROPS  USE SUGARLESS CANDY INSTEAD (Jolley ranchers or Stover's or Life Savers) or even ice chips will also do - the key is to swallow to prevent all throat clearing. NO OIL BASED VITAMINS - use powdered substitutes.  Avoid fish oil when coughing.    For drainage / throat tickle try take CHLORPHENIRAMINE  4 mg  (Chlortab 4mg   at [employer] should be easiest to find in the green box)  take one every 4 hours as needed - available over the counter- may cause drowsiness so start with just a dose or two an hour before bedtime and see how you tolerate it before trying in daytime    For cough delsym 2 tsp every 12 hours and stop mucinex and robitissin   Please schedule a follow up office visit in 6 weeks, call sooner if needed with all medications /inhalers/ solutions in hand so we can verify exactly what you are taking. This includes all medications from all doctors and over the counters

## 2020-11-02 NOTE — Assessment & Plan Note (Signed)
Onset 2000 - 02/12/2014  Walked RA x 1 lap  @ 185 ft each stopped due to  Sob, no desat, moderate pace  - 02/12/2014 spirometry >   FEV1  1.41 (83%) ratio 83 - Echo 05/20/20  Nl x trivial AI  -  11/02/2020   Walked RA  approx   150 ft  @ slow/rollator pace  stopped due to  Sob with sats still 98%     Exam most impressive for psedowheeze/  20 lb wt gain since last eval and obvious deconditioning on walk > rec reconditioning as tol and focus for now on upper airway   Each maintenance medication was reviewed in detail including emphasizing most importantly the difference between maintenance and prns and under what circumstances the prns are to be triggered using an action plan format where appropriate.  Total time for H and P, chart review, counseling,  directly observing portions of ambulatory 02 saturation study/ and generating customized AVS unique to this office visit / same day charting in pt not seen x > 3 y  > 60 min

## 2020-11-03 ENCOUNTER — Telehealth: Payer: Self-pay | Admitting: Gastroenterology

## 2020-11-03 ENCOUNTER — Other Ambulatory Visit: Payer: Self-pay | Admitting: *Deleted

## 2020-11-03 ENCOUNTER — Other Ambulatory Visit: Payer: Self-pay | Admitting: Gastroenterology

## 2020-11-03 ENCOUNTER — Other Ambulatory Visit: Payer: Self-pay | Admitting: Family Medicine

## 2020-11-03 DIAGNOSIS — M545 Low back pain, unspecified: Secondary | ICD-10-CM | POA: Diagnosis not present

## 2020-11-03 DIAGNOSIS — M25551 Pain in right hip: Secondary | ICD-10-CM | POA: Diagnosis not present

## 2020-11-03 DIAGNOSIS — K219 Gastro-esophageal reflux disease without esophagitis: Secondary | ICD-10-CM

## 2020-11-03 DIAGNOSIS — D649 Anemia, unspecified: Secondary | ICD-10-CM

## 2020-11-03 DIAGNOSIS — R1031 Right lower quadrant pain: Secondary | ICD-10-CM | POA: Diagnosis not present

## 2020-11-03 DIAGNOSIS — Z6832 Body mass index (BMI) 32.0-32.9, adult: Secondary | ICD-10-CM | POA: Diagnosis not present

## 2020-11-03 NOTE — Telephone Encounter (Signed)
Spoke to pt. Daughter Otila Kluver (DPR). Informed her of results and recommendations. Daughter stated that she understood and would take mother to [facility] for labs to be drawn. Daughter also stated that mother is still bleeding with every bowel movement. She thinks this is due to hemorrhoids.

## 2020-11-03 NOTE — Telephone Encounter (Addendum)
Received and reviewed labs from PCP dated 10/12/2020. Iron panel: Iron 25 (L), iron saturation 5% (L), ferritin 20, folate 11.3. CBC: WBC 11.6 (H), hemoglobin 10.2 (L), MCH 23.1 (L), MCHC 28.2 (L), platelets 516 (H) CMP: Glucose 165 (H), creatinine 1.12 (H), sodium 135, potassium 4.5, calcium 9.0, albumin 3.7, total bilirubin <0.2, alk phos 99, AST 14, ALT 13  Courtney:  Please let patient know we requested most recent blood work from PCP and received labs completed on 10/12/2020. Hemoglobin was fairly stable at 10.2 at that time.  Patient reported having blood work last week, but we did not receive this.  At this point, I would recommend updating a CBC due to rectal bleeding.   Please arrange CBC. Diagnosis: Anemia, rectal bleeding.

## 2020-11-03 NOTE — Telephone Encounter (Signed)
Lmom for pt. To call office back. 

## 2020-11-03 NOTE — Telephone Encounter (Signed)
Further recommendations once we have blood work. I am waiting to discuss if we are able to band her hemorrhoids with Roseanne Kaufman, NP when she returns next week. If she feels lightheaded, dizzy, significantly weak, she needs to proceed to the emergency room. Also recommend she reach out to her cardiologist to discuss Eliquis in the setting of rectal bleeding.

## 2020-11-03 NOTE — Telephone Encounter (Signed)
Spoke to pt. Daughter Otila Kluver [facility]) informed her of recommendations. Informed her if mother is experiencing  any lightheaded, dizzy, or weakness to please go to the ER. Informed her to also talk to cardiologist about Eliquis. Daughter voiced that she understood.

## 2020-11-04 ENCOUNTER — Other Ambulatory Visit: Payer: Self-pay

## 2020-11-04 ENCOUNTER — Other Ambulatory Visit (HOSPITAL_COMMUNITY)
Admission: RE | Admit: 2020-11-04 | Discharge: 2020-11-04 | Disposition: A | Discharge status: Home / Self Care | Payer: PPO | Source: Ambulatory Visit | Attending: Gastroenterology | Admitting: Gastroenterology

## 2020-11-04 DIAGNOSIS — D649 Anemia, unspecified: Secondary | ICD-10-CM | POA: Insufficient documentation

## 2020-11-04 LAB — CBC WITH DIFFERENTIAL/PLATELET
Abs Immature Granulocytes: 0.19 10*3/uL — ABNORMAL HIGH (ref 0.00–0.07)
Basophils Absolute: 0.1 10*3/uL (ref 0.0–0.1)
Basophils Relative: 1 %
Eosinophils Absolute: 0.1 10*3/uL (ref 0.0–0.5)
Eosinophils Relative: 1 %
HCT: 30.5 % — ABNORMAL LOW (ref 36.0–46.0)
Hemoglobin: 9.3 g/dL — ABNORMAL LOW (ref 12.0–15.0)
Immature Granulocytes: 2 %
Lymphocytes Relative: 18 %
Lymphs Abs: 1.8 10*3/uL (ref 0.7–4.0)
MCH: 23.7 pg — ABNORMAL LOW (ref 26.0–34.0)
MCHC: 30.5 g/dL (ref 30.0–36.0)
MCV: 77.8 fL — ABNORMAL LOW (ref 80.0–100.0)
Monocytes Absolute: 0.8 10*3/uL (ref 0.1–1.0)
Monocytes Relative: 8 %
Neutro Abs: 6.8 10*3/uL (ref 1.7–7.7)
Neutrophils Relative %: 70 %
Platelets: 497 10*3/uL — ABNORMAL HIGH (ref 150–400)
RBC: 3.92 MIL/uL (ref 3.87–5.11)
RDW: 19.9 % — ABNORMAL HIGH (ref 11.5–15.5)
WBC: 9.6 10*3/uL (ref 4.0–10.5)
nRBC: 0 % (ref 0.0–0.2)

## 2020-11-05 ENCOUNTER — Other Ambulatory Visit: Payer: Self-pay | Admitting: *Deleted

## 2020-11-05 ENCOUNTER — Telehealth: Payer: Self-pay | Admitting: Cardiology

## 2020-11-05 ENCOUNTER — Other Ambulatory Visit: Payer: Self-pay

## 2020-11-05 DIAGNOSIS — K649 Unspecified hemorrhoids: Secondary | ICD-10-CM

## 2020-11-05 DIAGNOSIS — D649 Anemia, unspecified: Secondary | ICD-10-CM

## 2020-11-05 DIAGNOSIS — Z79899 Other long term (current) drug therapy: Secondary | ICD-10-CM

## 2020-11-05 NOTE — Telephone Encounter (Signed)
New Message    Daughter [name] is calling to let office know how her mother is doing on the fluid pill:  Patient has lost 3lbs since last week , her weight today is 170lbs  weight last week was 173,  should she continue taking the increase in fluid pills? Or return to her normal dosage

## 2020-11-05 NOTE — Telephone Encounter (Signed)
Per daughter pt has lost 3 lbs on the increase dose of Lasix Pt to resume previous dose tomorrow Will forward to Dr Harl Bowie for review .Adonis Housekeeper

## 2020-11-08 NOTE — Telephone Encounter (Signed)
Would continue with the lasix 40mg  daily, can she get a bmet/mg/bnp at the end of the week please  J Shallyn Constancio MD

## 2020-11-08 NOTE — Telephone Encounter (Signed)
Left a message for pt's daughter to give [place] a call back.

## 2020-11-08 NOTE — Telephone Encounter (Signed)
Pt is having MRI on Friday at [facility] with a CBC. Pt's daughter asked if she could have labwork completed that day and was advised that it was acceptable. Orders placed for BMET/MG/BNP.

## 2020-11-09 DIAGNOSIS — L89322 Pressure ulcer of left buttock, stage 2: Secondary | ICD-10-CM | POA: Diagnosis not present

## 2020-11-10 ENCOUNTER — Ambulatory Visit (HOSPITAL_COMMUNITY)
Admission: RE | Admit: 2020-11-10 | Discharge: 2020-11-10 | Disposition: A | Discharge status: Home / Self Care | Payer: PPO | Attending: Internal Medicine | Admitting: Internal Medicine

## 2020-11-10 ENCOUNTER — Encounter (HOSPITAL_COMMUNITY)
Admission: RE | Disposition: A | Discharge status: Home / Self Care | Payer: Self-pay | Source: Home / Self Care | Attending: Internal Medicine

## 2020-11-10 DIAGNOSIS — K254 Chronic or unspecified gastric ulcer with hemorrhage: Secondary | ICD-10-CM | POA: Diagnosis not present

## 2020-11-10 DIAGNOSIS — K648 Other hemorrhoids: Secondary | ICD-10-CM | POA: Insufficient documentation

## 2020-11-10 DIAGNOSIS — D509 Iron deficiency anemia, unspecified: Secondary | ICD-10-CM | POA: Insufficient documentation

## 2020-11-10 DIAGNOSIS — D500 Iron deficiency anemia secondary to blood loss (chronic): Secondary | ICD-10-CM

## 2020-11-10 DIAGNOSIS — K644 Residual hemorrhoidal skin tags: Secondary | ICD-10-CM | POA: Diagnosis not present

## 2020-11-10 DIAGNOSIS — K633 Ulcer of intestine: Secondary | ICD-10-CM | POA: Diagnosis not present

## 2020-11-10 DIAGNOSIS — Z7901 Long term (current) use of anticoagulants: Secondary | ICD-10-CM | POA: Insufficient documentation

## 2020-11-10 HISTORY — PX: GIVENS CAPSULE STUDY: SHX5432

## 2020-11-10 SURGERY — IMAGING PROCEDURE, GI TRACT, INTRALUMINAL, VIA CAPSULE
Anesthesia: Monitor Anesthesia Care

## 2020-11-11 ENCOUNTER — Encounter (HOSPITAL_COMMUNITY): Payer: Self-pay | Admitting: Internal Medicine

## 2020-11-12 ENCOUNTER — Telehealth: Payer: Self-pay | Admitting: Cardiology

## 2020-11-12 DIAGNOSIS — K649 Unspecified hemorrhoids: Secondary | ICD-10-CM | POA: Diagnosis not present

## 2020-11-12 DIAGNOSIS — T199XXA Foreign body in genitourinary tract, part unspecified, initial encounter: Secondary | ICD-10-CM | POA: Diagnosis not present

## 2020-11-12 DIAGNOSIS — D649 Anemia, unspecified: Secondary | ICD-10-CM | POA: Diagnosis not present

## 2020-11-12 DIAGNOSIS — I5032 Chronic diastolic (congestive) heart failure: Secondary | ICD-10-CM

## 2020-11-12 DIAGNOSIS — M4316 Spondylolisthesis, lumbar region: Secondary | ICD-10-CM | POA: Diagnosis not present

## 2020-11-12 DIAGNOSIS — M5126 Other intervertebral disc displacement, lumbar region: Secondary | ICD-10-CM | POA: Diagnosis not present

## 2020-11-12 DIAGNOSIS — M5136 Other intervertebral disc degeneration, lumbar region: Secondary | ICD-10-CM | POA: Diagnosis not present

## 2020-11-12 DIAGNOSIS — R109 Unspecified abdominal pain: Secondary | ICD-10-CM | POA: Diagnosis not present

## 2020-11-12 DIAGNOSIS — I10 Essential (primary) hypertension: Secondary | ICD-10-CM

## 2020-11-12 NOTE — Telephone Encounter (Signed)
New message    Patient was seen earlier this week for swelling , she was told to increase her lasik and to call back today and let them know how she is doing, per daughter patient has gained another 2lbs  .  Her weight today is 175 ,  she was 173 ?    Daughter said she is eating better since she is home - 3 meals a day, her breathing has been a little better, no chest pain, no swelling that she can see.

## 2020-11-14 DIAGNOSIS — I5033 Acute on chronic diastolic (congestive) heart failure: Secondary | ICD-10-CM | POA: Diagnosis not present

## 2020-11-14 DIAGNOSIS — N1832 Chronic kidney disease, stage 3b: Secondary | ICD-10-CM | POA: Diagnosis not present

## 2020-11-14 DIAGNOSIS — J440 Chronic obstructive pulmonary disease with acute lower respiratory infection: Secondary | ICD-10-CM | POA: Diagnosis not present

## 2020-11-14 DIAGNOSIS — I130 Hypertensive heart and chronic kidney disease with heart failure and stage 1 through stage 4 chronic kidney disease, or unspecified chronic kidney disease: Secondary | ICD-10-CM | POA: Diagnosis not present

## 2020-11-14 DIAGNOSIS — Z87891 Personal history of nicotine dependence: Secondary | ICD-10-CM | POA: Diagnosis not present

## 2020-11-15 ENCOUNTER — Telehealth: Payer: Self-pay | Admitting: Gastroenterology

## 2020-11-15 DIAGNOSIS — D649 Anemia, unspecified: Secondary | ICD-10-CM

## 2020-11-15 DIAGNOSIS — K625 Hemorrhage of anus and rectum: Secondary | ICD-10-CM

## 2020-11-15 NOTE — Telephone Encounter (Signed)
Verify she has been taking lasix 40mg  daily, if so increase to 60mg  daily. Weight reported at 175 today, loos like prior clinic note had done well around 166 lbs. Has she had or having soon her labs?   Zandra Abts MD

## 2020-11-15 NOTE — Telephone Encounter (Signed)
Reviewed repeat H/H completed at [facility] on 7/29. Hg was 7.9, down from 9.3 on 7/21. Patient's daughter states the rectal bleeding has actually slacked off over the last several days.  Only with small amount of bright red blood per rectum about every other day.  She has continued on Eliquis and did not discuss this with cardiologist as previously recommended.  I recommended stat repeat CBC tomorrow with further recommendations to follow.  Of note, we have placed referral to general surgery for bleeding hemorrhoids, but she does not have an appointment as of yet.  We will likely need to reach out to their office to get this arranged ASAP, pending repeat labs tomorrow. We may also need to hold Eliquis if further decline in Hg and ongoing rectal bleeding. I have requested patient's daughter to call her cardiologist tomorrow morning.  I will also send a staff message to Dr. Harl Bowie today.  Courtney/Angie: I have placed orders for STAT CBC to be completed tomorrow at [facility]. Can you please fax orders first thing in the morning?

## 2020-11-16 ENCOUNTER — Other Ambulatory Visit: Payer: Self-pay

## 2020-11-16 ENCOUNTER — Other Ambulatory Visit: Payer: Self-pay | Admitting: *Deleted

## 2020-11-16 ENCOUNTER — Telehealth: Payer: Self-pay | Admitting: Cardiology

## 2020-11-16 ENCOUNTER — Other Ambulatory Visit (HOSPITAL_COMMUNITY)
Admission: RE | Admit: 2020-11-16 | Discharge: 2020-11-16 | Disposition: A | Discharge status: Home / Self Care | Payer: PPO | Source: Ambulatory Visit | Attending: Gastroenterology | Admitting: Gastroenterology

## 2020-11-16 ENCOUNTER — Telehealth: Payer: Self-pay | Admitting: Internal Medicine

## 2020-11-16 DIAGNOSIS — Z9181 History of falling: Secondary | ICD-10-CM | POA: Diagnosis not present

## 2020-11-16 DIAGNOSIS — D649 Anemia, unspecified: Secondary | ICD-10-CM | POA: Insufficient documentation

## 2020-11-16 DIAGNOSIS — Z7901 Long term (current) use of anticoagulants: Secondary | ICD-10-CM | POA: Diagnosis not present

## 2020-11-16 DIAGNOSIS — F419 Anxiety disorder, unspecified: Secondary | ICD-10-CM | POA: Diagnosis not present

## 2020-11-16 DIAGNOSIS — E1142 Type 2 diabetes mellitus with diabetic polyneuropathy: Secondary | ICD-10-CM | POA: Diagnosis not present

## 2020-11-16 DIAGNOSIS — E1151 Type 2 diabetes mellitus with diabetic peripheral angiopathy without gangrene: Secondary | ICD-10-CM | POA: Diagnosis not present

## 2020-11-16 DIAGNOSIS — I480 Paroxysmal atrial fibrillation: Secondary | ICD-10-CM | POA: Diagnosis not present

## 2020-11-16 DIAGNOSIS — I5032 Chronic diastolic (congestive) heart failure: Secondary | ICD-10-CM | POA: Diagnosis not present

## 2020-11-16 DIAGNOSIS — F32A Depression, unspecified: Secondary | ICD-10-CM | POA: Diagnosis not present

## 2020-11-16 DIAGNOSIS — M1030 Gout due to renal impairment, unspecified site: Secondary | ICD-10-CM | POA: Diagnosis not present

## 2020-11-16 DIAGNOSIS — I2510 Atherosclerotic heart disease of native coronary artery without angina pectoris: Secondary | ICD-10-CM | POA: Diagnosis not present

## 2020-11-16 DIAGNOSIS — E1122 Type 2 diabetes mellitus with diabetic chronic kidney disease: Secondary | ICD-10-CM | POA: Diagnosis not present

## 2020-11-16 DIAGNOSIS — I130 Hypertensive heart and chronic kidney disease with heart failure and stage 1 through stage 4 chronic kidney disease, or unspecified chronic kidney disease: Secondary | ICD-10-CM | POA: Diagnosis not present

## 2020-11-16 DIAGNOSIS — I700 Atherosclerosis of aorta: Secondary | ICD-10-CM | POA: Diagnosis not present

## 2020-11-16 DIAGNOSIS — N184 Chronic kidney disease, stage 4 (severe): Secondary | ICD-10-CM | POA: Diagnosis not present

## 2020-11-16 DIAGNOSIS — I252 Old myocardial infarction: Secondary | ICD-10-CM | POA: Diagnosis not present

## 2020-11-16 DIAGNOSIS — L89322 Pressure ulcer of left buttock, stage 2: Secondary | ICD-10-CM | POA: Diagnosis not present

## 2020-11-16 DIAGNOSIS — J441 Chronic obstructive pulmonary disease with (acute) exacerbation: Secondary | ICD-10-CM | POA: Diagnosis not present

## 2020-11-16 LAB — CBC WITH DIFFERENTIAL/PLATELET
Abs Immature Granulocytes: 0.17 10*3/uL — ABNORMAL HIGH (ref 0.00–0.07)
Basophils Absolute: 0.1 10*3/uL (ref 0.0–0.1)
Basophils Relative: 1 %
Eosinophils Absolute: 0.2 10*3/uL (ref 0.0–0.5)
Eosinophils Relative: 3 %
HCT: 28.1 % — ABNORMAL LOW (ref 36.0–46.0)
Hemoglobin: 8.3 g/dL — ABNORMAL LOW (ref 12.0–15.0)
Immature Granulocytes: 2 %
Lymphocytes Relative: 23 %
Lymphs Abs: 1.8 10*3/uL (ref 0.7–4.0)
MCH: 23.5 pg — ABNORMAL LOW (ref 26.0–34.0)
MCHC: 29.5 g/dL — ABNORMAL LOW (ref 30.0–36.0)
MCV: 79.6 fL — ABNORMAL LOW (ref 80.0–100.0)
Monocytes Absolute: 0.6 10*3/uL (ref 0.1–1.0)
Monocytes Relative: 7 %
Neutro Abs: 4.9 10*3/uL (ref 1.7–7.7)
Neutrophils Relative %: 64 %
Platelets: 426 10*3/uL — ABNORMAL HIGH (ref 150–400)
RBC: 3.53 MIL/uL — ABNORMAL LOW (ref 3.87–5.11)
RDW: 20.4 % — ABNORMAL HIGH (ref 11.5–15.5)
WBC: 7.6 10*3/uL (ref 4.0–10.5)
nRBC: 0.5 % — ABNORMAL HIGH (ref 0.0–0.2)

## 2020-11-16 MED ORDER — FUROSEMIDE 40 MG PO TABS: 60.0000 mg | ORAL_TABLET | Freq: Every day | ORAL | Status: DC

## 2020-11-16 NOTE — Telephone Encounter (Signed)
Per Caryl Pina, pt states MW was supposed to increase gabapentin, however the amount prescribed is what she was prescribed before. Notes in AVS state:Gabapentin 100 mg twice daily x one week, then three times x one week , then for times daily x one week  adding one extra pill until cough is gone then hold there until return  Please advise [phone]

## 2020-11-16 NOTE — Telephone Encounter (Signed)
Hg today stable/slightly improved to 8.3 today from 7.9 on 7/29.  Patient/patient's daughter is aware.  Rectal bleeding does continue, but again, daughter endorses that this has definitely slacked off.  I discussed case with Dr. Harl Bowie, cardiologist, who has recommended discontinuing Eliquis for now until bleeding resolves.  I tried reaching patient's daughter to notify her of these recommendations, but had to leave a message requesting return call.  [facility] Clinical Pool: Can we follow-up on patient's referral to general surgery?  She needs appointment ASAP due to bleeding hemorrhoids and declining hemoglobin.

## 2020-11-16 NOTE — Telephone Encounter (Signed)
Otila Kluver (daughter) notified.  Stated that she had only been taking the 40mg  daily.  She will bump that up to 60mg  daily.  Will do labs tomorrow morning at [facility].

## 2020-11-16 NOTE — Telephone Encounter (Signed)
Returned call to [place] and notified that pt is hold Eliquis as long as GI feels fit.

## 2020-11-16 NOTE — Telephone Encounter (Signed)
Lab order sent to [facility].

## 2020-11-16 NOTE — Telephone Encounter (Signed)
According to referral it was placed as urgent and they called pt and LMOVM to call back on 7/22

## 2020-11-16 NOTE — Telephone Encounter (Signed)
New message    Pt c/o medication issue:  1. Name of Medication: apixaban (ELIQUIS) 5 MG TABS tablet  2. How are you currently taking this medication (dosage and times per day)? 1 tablet 2x a day   3. Are you having a reaction (difficulty breathing--STAT)? yes  4. What is your medication issue? Patient is having rectal bleeding from a hemmorhoid and they want to know if they should stop this , patients hemoglobin was 8.3 when they did her blood work

## 2020-11-16 NOTE — Telephone Encounter (Signed)
Noted. Spoke with patient's daughter. She did not realize Dr. Prentiss Bells. Arnoldo Morale' office had called. She is going to call today to arrange an appt. Also relayed recommendations on holding Eliquis starting today in light of ongoing rectal bleeding per Dr.Branch. Requested she call our office back on Thursday with a progress report.

## 2020-11-16 NOTE — Telephone Encounter (Signed)
Spoke with [name] who states that she was told by GI that they talked to Dr. Harl Bowie and would like to stop the Eliquis for 48 hours. Please advise.

## 2020-11-16 NOTE — Telephone Encounter (Signed)
[name] called to report weight as 173 . Her weight has remained about the same and she hasn't gotten any better only lost about 2lbs in the week. She can be reached [phone].

## 2020-11-16 NOTE — Telephone Encounter (Signed)
Called and spoke with [name] from [place] Per [name], pt states MW was supposed to increase gabapentin, however the amount prescribed is what she was prescribed before.   Notes in AVS state:Gabapentin 100 mg twice daily x one week, then three times x one week , then for times daily x one week  adding one extra pill until cough is gone then hold there until return. Please advise [phone]  Dr. Melvyn Novas please advise and send over new prescription to match the taper instructions above.

## 2020-11-16 NOTE — Telephone Encounter (Signed)
My understanding is that she wasn't really taking it as maint rx at that dose but if she was already on 100 mg qid,  then follow same recs to add 100 mg per week until get to 200 mg qid or the lowest dose that controls the cough (by given a second prescription of 100 mg qid )

## 2020-11-16 NOTE — Telephone Encounter (Signed)
Called patient to confirm how she has been taking the gabapentin. She did not answer. Left message for her to call back.

## 2020-11-16 NOTE — Telephone Encounter (Signed)
Noted  

## 2020-11-17 ENCOUNTER — Emergency Department (HOSPITAL_COMMUNITY): Payer: PPO

## 2020-11-17 ENCOUNTER — Other Ambulatory Visit: Payer: Self-pay

## 2020-11-17 ENCOUNTER — Telehealth: Payer: Self-pay | Admitting: Gastroenterology

## 2020-11-17 ENCOUNTER — Other Ambulatory Visit (HOSPITAL_COMMUNITY)
Admission: RE | Admit: 2020-11-17 | Discharge: 2020-11-17 | Disposition: A | Discharge status: Home / Self Care | Payer: PPO | Source: Ambulatory Visit | Attending: Cardiology | Admitting: Cardiology

## 2020-11-17 ENCOUNTER — Emergency Department (HOSPITAL_COMMUNITY)
Admission: EM | Admit: 2020-11-17 | Discharge: 2020-11-17 | Disposition: A | Discharge status: Home / Self Care | Payer: PPO | Attending: Emergency Medicine | Admitting: Emergency Medicine

## 2020-11-17 ENCOUNTER — Encounter (HOSPITAL_COMMUNITY): Payer: Self-pay | Admitting: *Deleted

## 2020-11-17 DIAGNOSIS — I130 Hypertensive heart and chronic kidney disease with heart failure and stage 1 through stage 4 chronic kidney disease, or unspecified chronic kidney disease: Secondary | ICD-10-CM | POA: Diagnosis not present

## 2020-11-17 DIAGNOSIS — J449 Chronic obstructive pulmonary disease, unspecified: Secondary | ICD-10-CM | POA: Insufficient documentation

## 2020-11-17 DIAGNOSIS — Z7901 Long term (current) use of anticoagulants: Secondary | ICD-10-CM | POA: Diagnosis not present

## 2020-11-17 DIAGNOSIS — Z955 Presence of coronary angioplasty implant and graft: Secondary | ICD-10-CM | POA: Diagnosis not present

## 2020-11-17 DIAGNOSIS — E039 Hypothyroidism, unspecified: Secondary | ICD-10-CM | POA: Insufficient documentation

## 2020-11-17 DIAGNOSIS — I10 Essential (primary) hypertension: Secondary | ICD-10-CM

## 2020-11-17 DIAGNOSIS — I5032 Chronic diastolic (congestive) heart failure: Secondary | ICD-10-CM

## 2020-11-17 DIAGNOSIS — Z8616 Personal history of COVID-19: Secondary | ICD-10-CM | POA: Insufficient documentation

## 2020-11-17 DIAGNOSIS — R4182 Altered mental status, unspecified: Secondary | ICD-10-CM | POA: Insufficient documentation

## 2020-11-17 DIAGNOSIS — R0602 Shortness of breath: Secondary | ICD-10-CM

## 2020-11-17 DIAGNOSIS — R079 Chest pain, unspecified: Secondary | ICD-10-CM | POA: Diagnosis not present

## 2020-11-17 DIAGNOSIS — Z87891 Personal history of nicotine dependence: Secondary | ICD-10-CM | POA: Diagnosis not present

## 2020-11-17 DIAGNOSIS — Z96651 Presence of right artificial knee joint: Secondary | ICD-10-CM | POA: Insufficient documentation

## 2020-11-17 DIAGNOSIS — I509 Heart failure, unspecified: Secondary | ICD-10-CM

## 2020-11-17 DIAGNOSIS — Z7951 Long term (current) use of inhaled steroids: Secondary | ICD-10-CM | POA: Insufficient documentation

## 2020-11-17 DIAGNOSIS — Z20822 Contact with and (suspected) exposure to covid-19: Secondary | ICD-10-CM | POA: Diagnosis not present

## 2020-11-17 DIAGNOSIS — R6883 Chills (without fever): Secondary | ICD-10-CM | POA: Diagnosis not present

## 2020-11-17 DIAGNOSIS — Z86018 Personal history of other benign neoplasm: Secondary | ICD-10-CM | POA: Insufficient documentation

## 2020-11-17 DIAGNOSIS — Z8679 Personal history of other diseases of the circulatory system: Secondary | ICD-10-CM | POA: Insufficient documentation

## 2020-11-17 DIAGNOSIS — N183 Chronic kidney disease, stage 3 unspecified: Secondary | ICD-10-CM | POA: Diagnosis not present

## 2020-11-17 DIAGNOSIS — I5023 Acute on chronic systolic (congestive) heart failure: Secondary | ICD-10-CM | POA: Diagnosis not present

## 2020-11-17 DIAGNOSIS — R410 Disorientation, unspecified: Secondary | ICD-10-CM | POA: Diagnosis not present

## 2020-11-17 DIAGNOSIS — R531 Weakness: Secondary | ICD-10-CM | POA: Diagnosis not present

## 2020-11-17 DIAGNOSIS — Z79899 Other long term (current) drug therapy: Secondary | ICD-10-CM | POA: Insufficient documentation

## 2020-11-17 DIAGNOSIS — I110 Hypertensive heart disease with heart failure: Secondary | ICD-10-CM | POA: Diagnosis not present

## 2020-11-17 LAB — BRAIN NATRIURETIC PEPTIDE
B Natriuretic Peptide: 431 pg/mL — ABNORMAL HIGH (ref 0.0–100.0)
B Natriuretic Peptide: 387 pg/mL — ABNORMAL HIGH (ref 0.0–100.0)

## 2020-11-17 LAB — URINALYSIS, ROUTINE W REFLEX MICROSCOPIC
Bilirubin Urine: NEGATIVE
Glucose, UA: NEGATIVE mg/dL
Hgb urine dipstick: NEGATIVE
Ketones, ur: NEGATIVE mg/dL
Leukocytes,Ua: NEGATIVE
Nitrite: NEGATIVE
Protein, ur: NEGATIVE mg/dL
Specific Gravity, Urine: 1.006 (ref 1.005–1.030)
pH: 7 (ref 5.0–8.0)

## 2020-11-17 LAB — COMPREHENSIVE METABOLIC PANEL
ALT: 17 U/L (ref 0–44)
AST: 19 U/L (ref 15–41)
Albumin: 3.4 g/dL — ABNORMAL LOW (ref 3.5–5.0)
Alkaline Phosphatase: 123 U/L (ref 38–126)
Anion gap: 10 (ref 5–15)
BUN: 17 mg/dL (ref 8–23)
CO2: 26 mmol/L (ref 22–32)
Calcium: 8.7 mg/dL — ABNORMAL LOW (ref 8.9–10.3)
Chloride: 97 mmol/L — ABNORMAL LOW (ref 98–111)
Creatinine, Ser: 1.09 mg/dL — ABNORMAL HIGH (ref 0.44–1.00)
GFR, Estimated: 49 mL/min — ABNORMAL LOW (ref >60)
Glucose, Bld: 152 mg/dL — ABNORMAL HIGH (ref 70–99)
Potassium: 3.5 mmol/L (ref 3.5–5.1)
Sodium: 133 mmol/L — ABNORMAL LOW (ref 135–145)
Total Bilirubin: 0.7 mg/dL (ref 0.3–1.2)
Total Protein: 7.3 g/dL (ref 6.5–8.1)

## 2020-11-17 LAB — CBC WITH DIFFERENTIAL/PLATELET
Abs Immature Granulocytes: 0.22 10*3/uL — ABNORMAL HIGH (ref 0.00–0.07)
Basophils Absolute: 0.1 10*3/uL (ref 0.0–0.1)
Basophils Relative: 1 %
Eosinophils Absolute: 0.2 10*3/uL (ref 0.0–0.5)
Eosinophils Relative: 3 %
HCT: 31.2 % — ABNORMAL LOW (ref 36.0–46.0)
Hemoglobin: 8.9 g/dL — ABNORMAL LOW (ref 12.0–15.0)
Immature Granulocytes: 3 %
Lymphocytes Relative: 17 %
Lymphs Abs: 1.4 10*3/uL (ref 0.7–4.0)
MCH: 23.2 pg — ABNORMAL LOW (ref 26.0–34.0)
MCHC: 28.5 g/dL — ABNORMAL LOW (ref 30.0–36.0)
MCV: 81.5 fL (ref 80.0–100.0)
Monocytes Absolute: 0.5 10*3/uL (ref 0.1–1.0)
Monocytes Relative: 6 %
Neutro Abs: 5.6 10*3/uL (ref 1.7–7.7)
Neutrophils Relative %: 70 %
Platelets: 479 10*3/uL — ABNORMAL HIGH (ref 150–400)
RBC: 3.83 MIL/uL — ABNORMAL LOW (ref 3.87–5.11)
RDW: 21 % — ABNORMAL HIGH (ref 11.5–15.5)
WBC: 8 10*3/uL (ref 4.0–10.5)
nRBC: 0 % (ref 0.0–0.2)

## 2020-11-17 LAB — BASIC METABOLIC PANEL
Anion gap: 11 (ref 5–15)
BUN: 18 mg/dL (ref 8–23)
CO2: 26 mmol/L (ref 22–32)
Calcium: 8.8 mg/dL — ABNORMAL LOW (ref 8.9–10.3)
Chloride: 95 mmol/L — ABNORMAL LOW (ref 98–111)
Creatinine, Ser: 1.13 mg/dL — ABNORMAL HIGH (ref 0.44–1.00)
GFR, Estimated: 47 mL/min — ABNORMAL LOW (ref >60)
Glucose, Bld: 189 mg/dL — ABNORMAL HIGH (ref 70–99)
Potassium: 3.5 mmol/L (ref 3.5–5.1)
Sodium: 132 mmol/L — ABNORMAL LOW (ref 135–145)

## 2020-11-17 LAB — RAPID URINE DRUG SCREEN, HOSP PERFORMED
Amphetamines: NOT DETECTED
Barbiturates: NOT DETECTED
Benzodiazepines: POSITIVE — AB
Cocaine: NOT DETECTED
Opiates: NOT DETECTED
Tetrahydrocannabinol: NOT DETECTED

## 2020-11-17 LAB — MAGNESIUM: Magnesium: 2 mg/dL (ref 1.7–2.4)

## 2020-11-17 LAB — TROPONIN I (HIGH SENSITIVITY)
Troponin I (High Sensitivity): 6 ng/L (ref <18)
Troponin I (High Sensitivity): 6 ng/L (ref <18)

## 2020-11-17 MED ORDER — FUROSEMIDE 10 MG/ML IJ SOLN
20.0000 mg | Freq: Once | INTRAMUSCULAR | Status: AC
  Administered 2020-11-17: 20 mg via INTRAVENOUS
  Filled 2020-11-17: qty 2

## 2020-11-17 MED ORDER — GABAPENTIN 100 MG PO CAPS: 100.0000 mg | ORAL_CAPSULE | Freq: Four times a day (QID) | ORAL | 11 refills | Status: DC

## 2020-11-17 NOTE — Telephone Encounter (Signed)
Called and spoke with pt and she has been taking the gabapentin 1 capsule in the morning, and 1 capsule at lunch time and 2 capsules at bedtime.  She stated that this is what her and MW spoke about at her last OV.  They are needing a new rx to be sent in for her so she does not run out.

## 2020-11-17 NOTE — Discharge Instructions (Addendum)
Your symptoms are likely coming from exacerbation of your CHF.  Continue taking your Lasix at 60 mg daily.  Please follow-up with Dr. Nelly Laurence office tomorrow for recheck.  As discussed, return to the emergency department for any new or worsening symptoms.

## 2020-11-17 NOTE — ED Notes (Signed)
Pt arrived with sacral and buttock ulcers.  Pt has a nurse come into her home for care.  Wounds are currently covered

## 2020-11-17 NOTE — ED Triage Notes (Signed)
Pt with SOB and CP, chills and generalized weakness. + cough NP.  Ongoing since yesterday morning.  Daughter states pt is confused, noted yesterday evening.  No appetite and sleeps more than usual.

## 2020-11-17 NOTE — Telephone Encounter (Signed)
Caryl Pina from [facility] is calling to check status of medication refill. Pls regard; [phone]

## 2020-11-17 NOTE — Op Note (Addendum)
   Small Bowel Givens Capsule Study Procedure date:  11/10/20  Referring Provider:  Aliene Altes, PA-C PCP:  Dr. Saunders Glance, Aundra Millet, PA-C  Indication for procedure: IDA/transfusion dependent anemia with intermittent rectal bleeding in the setting of known hemorrhoids/chronic anticoagulation.  EGD November 2021 with mild gastropathy, negative for H. pylori.  Colonoscopy in March 2022 with external and internal hemorrhoids, normal-appearing terminal ileum and colonic mucosa of the colon though biopsies revealed nonspecific colitis.  Capsule endoscopy being done to rule out occult bleeding from small bowel.  Patient data:  Wt: 78.9 kg Ht: 5 feet 1 inch  Findings: Small bowel study complete.  No active bleeding noted.  Few nonbleeding erosions at the pylorus (15 minutes 31 seconds for example).  Few nonbleeding erosions in the proximal small bowel (22 minutes 47 seconds, 43 minutes 10 seconds, 44 minutes 16 seconds for example).  Several lymphangiectasias small bowel (3 hours 21 minutes 11 seconds, 3 hours 59 minutes 28 seconds for example).  First Gastric image: 57 seconds First Duodenal image: 15 minutes 22 seconds First Ileo-Cecal Valve image: 5 hours 15 minutes 40 seconds First Cecal image: 5 hours 16 minutes 19 seconds Gastric Passage time: 14 minutes  Small Bowel Passage time: 5 hours   Summary & Recommendations: Several nonbleeding erosions noted at the pylorus, proximal small bowel.  Significance unclear but could possibly contribute to occult GI bleeding in the setting of anticoagulation.  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  No masses.    Avoid NSAIDs. Monitor H&H closely. Further recommendations from Aliene Altes, PA-C, ordering provider.  Laureen Ochs. Bernarda Caffey [facility] [phone] 8/3/20222:07 PM  Attending note: Pertinent images reviewed.  Agree with above assessment and recommendations.

## 2020-11-17 NOTE — ED Provider Notes (Signed)
[facility] EMERGENCY DEPARTMENT Provider Note   CSN: [id number] Arrival date & time: 11/17/20  1127     History Chief Complaint  Patient presents with   Shortness of Breath    [name] is a 87 y.o. female.   Shortness of Breath Associated symptoms: chest pain and cough   Associated symptoms: no fever, no headaches, no rash and no vomiting        [name] is a 87 y.o. female with past medical history of COPD, CHF, CKD, atrial fibrillation, and anemia.  she presents to the Emergency Department complaining of cough, chest pain, shortness of breath and generalized fatigue.  Symptoms have been present for several days.  Cough is described as mostly nonproductive.  Daughter of the patient also states that her mother has seemed confused since yesterday.  Daughter believes that her mother has mixed up her daily medications and may not be taking them correctly.  She does not use oxygen at home, but supposed to use a nebulizer.  Daughter states that she does not like using the nebulizer.  She denies fever, chills.  She was hospitalized in June for respiratory failure secondary to [place].   Patient takes Eliquis for her atrial fibrillation, but she is currently holding Eliquis per GI.  She has also been advised by Dr. Harl Bowie, her cardiologist to increase her 40 mg Lasix dose to 60 mg daily.   Past Medical History:  Diagnosis Date   Anxiety    Arthritis    Atrial fibrillation (HCC)    Bursitis    Left shoulder   Cataract    CHF (congestive heart failure) (HCC)    CKD (chronic kidney disease)    stage 3-4   COPD (chronic obstructive pulmonary disease) (HCC)    Coronary atherosclerosis of native coronary artery    a. s/p DES to LCx in 04/2013 b. cath in 11/2015 showing patent stent with 20% prox-LAD and 80% ostial RCA stenosis for which medical management was recommended due to small artery size   Depression    Diastolic heart failure (HCC)    EF 55-60%   Dysphagia,  unspecified(787.20)    Dyspnea    Dysrhythmia    Essential hypertension    GERD (gastroesophageal reflux disease)    Hx Schatzki's ring, multiple EGD/ED last 01/06/2004   Gout    Headache    History of anemia    Hyperlipidemia    Internal hemorrhoids without mention of complication    MI (myocardial infarction) ([place]) 2006   Microscopic colitis 2003   Panic disorder without agoraphobia    Paresthesia    Pneumonia 12/2011   PVD (peripheral vascular disease) ([place])    S/P colonoscopy 09/27/2001   internal hemorrhoids, desc colon inflam polyp, SB BX-chronic duodenitis, colitis   Sleep apnea    Thyroid disease     Patient Active Problem List   Diagnosis Date Noted   Hypoalbuminemia 09/30/2020   Dehydration 09/30/2020   Peripheral neuropathy 09/30/2020   COVID-19 virus infection 09/29/2020   Dark stools 12/12/2019   Odynophagia 12/12/2019   Pressure injury of skin 11/09/2019   Unstable angina (HCC) 11/08/2019   Atrial fibrillation with rapid ventricular response ([place])    Hypothyroidism 09/19/2019   AKI (acute kidney injury) ([place]) 10/01/2018   Persistent atrial fibrillation 12/30/2017   Acute on chronic combined systolic and diastolic heart failure ([place]) 12/30/2017   Hyperammonemia ([place]) 02/20/2017   Myoclonic jerking 02/20/2017   AF (paroxysmal atrial fibrillation) ([place]) 02/19/2017  Acute gout 65/78/4696   Chronic systolic heart failure (HCC)    Chest pain 09/30/2016   Status post lumbar spine surgery for decompression of spinal cord 08/07/2016   Lumbosacral spondylosis with radiculopathy 08/01/2016   Preoperative clearance 07/05/2016   Cough with pseudowheeze typical of UACS  05/26/2016   Generalized weakness 04/26/2016   Nausea without vomiting 04/26/2016   Atrial fibrillation with RVR ([place]) 04/15/2016   DOE (dyspnea on exertion) 03/20/2016   Acute on chronic diastolic congestive heart failure (HCC)    Chronic kidney disease, stage III (moderate) ([place]) 02/17/2016    Essential hypertension 02/17/2016   COPD (chronic obstructive pulmonary disease) ([place]) 02/17/2016   Acute bronchitis 12/18/2015   CAD (coronary artery disease)    Chest pain at rest 10/15/2015   Chronic low back pain 10/15/2015   Hyponatremia 10/15/2015   Hyperglycemia 10/15/2015   Thrombocytosis 10/15/2015   Atypical chest pain 10/15/2015   Depression    Anxiety    Gastroesophageal reflux disease without esophagitis    Mild cognitive impairment 10/14/2015   Iron deficiency anemia due to chronic blood loss    Meningitis 10/01/2015   Coronary artery disease due to lipid rich plaque    NSVT (nonsustained ventricular tachycardia) (HCC)    CSF leak 09/27/2015   Spondylolisthesis of lumbar region 09/24/2015   Gastric polyp    History of colonic polyps    Chronic diarrhea    Diarrhea 04/01/2015   Esophageal dysphagia 04/01/2015   PAOD (peripheral arterial occlusive disease) ([place]) 03/05/2015   Pain in the chest    Hyperlipidemia    Weight gain 08/12/2014   Anemia 08/07/2014   Hemorrhoids 08/06/2014   CHF (congestive heart failure) ([place]) 07/29/2014   Rectal bleeding 07/06/2014   Constipation 07/06/2014   Lower extremity edema 11/24/2013   Acute kidney injury superimposed on CKD ([place]) 11/24/2013   Hypokalemia 11/24/2013   Other and unspecified angina pectoris 05/14/2013   Hematochezia 02/13/2011   Lower abdominal pain 02/13/2011   Major depression ([place]) 09/28/2010   FATIGUE 04/13/2009   Chronic diastolic heart failure ([place]) 11/30/2008   DYSPNEA 11/30/2008   CAD, NATIVE VESSEL - PCI + DES to left circumflex 05/14/13 11/27/2008   Peripheral vascular disease ([place]) 11/27/2008   PANIC ATTACK 02/28/2008   MI 02/28/2008   Internal hemorrhoids 02/28/2008   COLITIS 02/28/2008   Dysphagia 02/28/2008    Past Surgical History:  Procedure Laterality Date   ABDOMINAL HYSTERECTOMY     ABDOMINAL HYSTERECTOMY     AGILE CAPSULE N/A 08/30/2020   Procedure: AGILE CAPSULE;  Surgeon: Daneil Dolin, MD;  [facility];  Service: Endoscopy;  Laterality: N/A;  7:30am   ANTERIOR AND POSTERIOR REPAIR     with resection of vagina   ANTERIOR LAT LUMBAR FUSION N/A 08/01/2016   Procedure: Lumbar Two-Lumbar Five Transpsoas lateral interbody fusion with Lumbar Two-Three lateral plate fixation;  Surgeon: Kevan Ny Ditty, MD;  Location: [place];  Service: Neurosurgery;  Laterality: N/A;  L2-5 Transpsoas lateral interbody fusion with L2-3 lateral plate fixation   APPENDECTOMY     BACK SURGERY     BIOPSY  07/05/2015   Procedure: BIOPSY;  Surgeon: Daneil Dolin, MD;  [facility];  Service: Endoscopy;;  gastric polyp biopsy, ascending colon biopsy   BIOPSY  02/16/2020   Procedure: BIOPSY;  Surgeon: Daneil Dolin, MD;  [facility];  Service: Endoscopy;;   BIOPSY  06/28/2020   Procedure: BIOPSY;  Surgeon: Daneil Dolin,  MD;  [facility];  Service: Endoscopy;;   BLADDER SUSPENSION  11/09/2011   Procedure: TRANSVAGINAL TAPE (TVT) PROCEDURE;  Surgeon: Marissa Nestle, MD;  Location: [facility] OR[facility];  Service: Urology;  Laterality: N/A;   bladder tack  06/2010   BREAST LUMPECTOMY  1998   left, benign   CARDIAC CATHETERIZATION     CARDIAC CATHETERIZATION     CARDIAC CATHETERIZATION N/A 12/16/2015   Procedure: Left Heart Cath and Coronary Angiography;  Surgeon: Troy Sine, MD;  Location: [place] CV LAB;  Service: Cardiovascular;  Laterality: N/A;   CARDIOVERSION N/A 10/04/2017   Procedure: CARDIOVERSION;  Surgeon: Herminio Commons, MD;  Location: [facility];  Service: Cardiovascular;  Laterality: N/A;   CARDIOVERSION N/A 01/30/2018   Procedure: CARDIOVERSION;  Surgeon: Herminio Commons, MD;  [facility];  Service: Cardiovascular;  Laterality: N/A;   CARDIOVERSION N/A 11/10/2019   Procedure: CARDIOVERSION;  Surgeon: Josue Hector, MD;  Location: [facility] ENDOSCOPY;  Service: Cardiovascular;  Laterality: N/A;       left   cataract surgery     CHOLECYSTECTOMY  1998   Cholecystectomy     COLONOSCOPY  03/16/2011   multiple hyperplastic colon polyps, sigmoid diverticulosis, melanosis coli   COLONOSCOPY WITH PROPOFOL N/A 07/05/2015   RMR:one 5 mm polyp in descending colon   COLONOSCOPY WITH PROPOFOL N/A 06/28/2020   Procedure: COLONOSCOPY WITH PROPOFOL;  Surgeon: Daneil Dolin, MD;  [facility];  Service: Endoscopy;  Laterality: N/A;  am appt   CORONARY ANGIOGRAPHY N/A 05/16/2018   Procedure: CORONARY ANGIOGRAPHY (CATH LAB);  Surgeon: Belva Crome, MD;  Location: [place] CV LAB;  Service: Cardiovascular;  Laterality: N/A;   CORONARY ANGIOPLASTY WITH STENT PLACEMENT  2015   ESOPHAGEAL DILATION N/A 07/05/2015   Procedure: ESOPHAGEAL DILATION;  Surgeon: Daneil Dolin, MD;  [facility];  Service: Endoscopy;  Laterality: N/A;   ESOPHAGOGASTRODUODENOSCOPY (EGD) WITH PROPOFOL N/A 07/05/2015   KGY:[id number]   ESOPHAGOGASTRODUODENOSCOPY (EGD) WITH PROPOFOL N/A 02/16/2020   Procedure: ESOPHAGOGASTRODUODENOSCOPY (EGD) WITH PROPOFOL;  Surgeon: Daneil Dolin, MD;  Normal examined esophagus s/p dilation, erythematous mucosa in the stomach s/p biopsy, normal examined duodenum. Pathology with mild gastropathy, negative for H. Pylori.    GIVENS CAPSULE STUDY N/A 11/10/2020   Procedure: GIVENS CAPSULE STUDY;  Surgeon: Daneil Dolin, MD;  [facility];  Service: Endoscopy;  Laterality: N/A;  7:30am   JOINT REPLACEMENT Right 2007   right knee   left hand surgery     LEFT HEART CATHETERIZATION WITH CORONARY ANGIOGRAM N/A 05/14/2013   Procedure: LEFT HEART CATHETERIZATION WITH CORONARY ANGIOGRAM;  Surgeon: Blane Ohara, MD;  Location: [facility] CATH LAB;  Service: Cardiovascular;  Laterality: N/A;   left rotator cuff surgery     LUMBAR LAMINECTOMY/DECOMPRESSION MICRODISCECTOMY N/A 10/11/2012   Procedure: LUMBAR LAMINECTOMY/DECOMPRESSION MICRODISCECTOMY 2 LEVELS;  Surgeon: Floyce Stakes, MD;   Location: [place] NEURO ORS;  Service: Neurosurgery;  Laterality: N/A;  L3-4 L4-5 Laminectomy   LUMBAR WOUND DEBRIDEMENT N/A 09/27/2015   Procedure: Exploration of Lumbar Wound w/ Repair CSF Leak/Lumbar Drain Placement;  Surgeon: Leeroy Cha, MD;  Location: [place] NEURO ORS;  Service: Neurosurgery;  Laterality: N/A;   MALONEY DILATION  03/16/2011   Gastritis. No H.pylori on bx. 107F maloney dilation with disruption of  occult cevical esophageal web   MALONEY DILATION N/A 02/16/2020   Procedure: MALONEY DILATION;  Surgeon: Daneil Dolin, MD;  [facility];  Service: Endoscopy;  Laterality: N/A;   NASAL SINUS SURGERY     right knee replacement  2007   right leg benign tumor     SHOULDER SURGERY Left    TEE WITHOUT CARDIOVERSION N/A 10/04/2017   Procedure: TRANSESOPHAGEAL ECHOCARDIOGRAM (TEE) WITH PROPOFOL;  Surgeon: Herminio Commons, MD;  Location: [facility];  Service: Cardiovascular;  Laterality: N/A;   TEE WITHOUT CARDIOVERSION N/A 11/10/2019   Procedure: TRANSESOPHAGEAL ECHOCARDIOGRAM (TEE);  Surgeon: Josue Hector, MD;  Location[facility] ENDOSCOPY;  Service: Cardiovascular;  Laterality: N/A;   TONSILLECTOMY     unspecified area, hysterectomy  1972   partial     OB History     Gravida  8   Para  6   Term      Preterm      AB  2   Living  5      SAB      IAB      Ectopic      Multiple      Live Births              Family History  Problem Relation Age of Onset   Stroke Mother    Parkinson's disease Father    Coronary artery disease Other        family Hx-sons   Cancer Other    Stroke Other        family Hx   Hypertension Other        family Hx   Diabetes Brother    Heart disease Son        before age 54   Diabetes Son    Stroke Daughter 19   Colon cancer Grandson        diagnosed 16   Inflammatory bowel disease Neg Hx     Social History   Tobacco Use   Smoking status: Former    Packs/day: 1.00    Years: 64.00    Pack years: 64.00     Types: Cigarettes    Start date: 12/24/1947    Quit date: 11/17/2001    Years since quitting: 19.0   Smokeless tobacco: Never   Tobacco comments:    Quit smoking in 2003  Vaping Use   Vaping Use: Never used  Substance Use Topics   Alcohol use: Not Currently   Drug use: No    Home Medications Prior to Admission medications   Medication Sig Start Date End Date Taking? Authorizing Provider  acetaminophen (TYLENOL) 500 MG tablet Take 500 mg by mouth every 6 (six) hours as needed for headache or moderate pain.    [provider]  ALPRAZolam Duanne Moron) 0.5 MG tablet Take 0.5 mg by mouth daily as needed for anxiety or sleep. 09/29/19   [provider]  apixaban (ELIQUIS) 5 MG TABS tablet Take 1 tablet (5 mg total) by mouth 2 (two) times daily. 07/14/19   Evans Lance, MD  ascorbic acid (VITAMIN C) 500 MG tablet Take 1 tablet (500 mg total) by mouth daily. 10/02/20   Roxan Hockey, MD  diltiazem (CARDIZEM CD) 120 MG 24 hr capsule TAKE ONE CAPSULE BY MOUTH DAILY 05/21/20   Verta Ellen., NP  DULoxetine (CYMBALTA) 60 MG capsule Take 60 mg by mouth at bedtime.    [provider]  ferrous sulfate 325 (65 FE) MG tablet Take 1 tablet (325 mg total) by mouth daily with breakfast. 08/09/20 08/09/21  Erenest Rasher, PA-C  fluticasone (CUTIVATE)  0.05 % cream Apply 1 application topically 2 (two) times daily as needed (skin tears). 07/15/19   [provider]  furosemide (LASIX) 40 MG tablet Take 1.5 tablets (60 mg total) by mouth daily. Dose changed 11/16/2020 11/16/20   Arnoldo Lenis, MD  gabapentin (NEURONTIN) 100 MG capsule Take 1 capsule (100 mg total) by mouth at bedtime. 11/02/20   Tanda Rockers, MD  hydrocortisone (ANUSOL-HC) 2.5 % rectal cream Place 1 application rectally 2 (two) times daily. 12/12/19   Erenest Rasher, PA-C  isosorbide mononitrate (IMDUR) 60 MG 24 hr tablet Take 60 mg by mouth daily.    [provider]  magnesium oxide (MAG-OX) 400  MG tablet TAKE ONE TABLET BY MOUTH DAILY 11/03/20   Arnoldo Lenis, MD  metoprolol succinate (TOPROL-XL) 25 MG 24 hr tablet Take 75 mg by mouth daily.    [provider]  Multiple Vitamins-Minerals (HAIR/SKIN/NAILS) CAPS Take 1 tablet by mouth daily.    [provider]  Naphazoline HCl (CLEAR EYES OP) Place 1 drop into both eyes daily as needed (itching).    [provider]  nitroGLYCERIN (NITROSTAT) 0.4 MG SL tablet Place 1 tablet (0.4 mg total) under the tongue every 5 (five) minutes as needed for chest pain. Reported on 08/04/2015 11/20/17   Herminio Commons, MD  pantoprazole (PROTONIX) 40 MG tablet TAKE ONE TABLET BY MOUTH TWICE DAILY BEFORE MEALS 11/03/20   Erenest Rasher, PA-C  potassium chloride (KLOR-CON) 10 MEQ tablet Take 1 tablet (10 mEq total) by mouth daily. 04/06/20   Arnoldo Lenis, MD  triamcinolone cream (KENALOG) 0.1 % Apply topically 2 (two) times daily. 08/26/20   [provider]  zinc sulfate 220 (50 Zn) MG capsule Take 1 capsule (220 mg total) by mouth daily. 10/02/20   Roxan Hockey, MD    Allergies    Cephalosporins, Levaquin [levofloxacin in d5w], Macrodantin [nitrofurantoin macrocrystal], Phenothiazines, Polysorbate, Prednisone, Buspirone, Cardura [doxazosin mesylate], Codeine, Acyclovir and related, Colcrys [colchicine], Prochlorperazine, Ranexa [ranolazine], Atorvastatin, Colestipol, Ofloxacin, Other, Penicillins, and Pimozide  Review of Systems   Review of Systems  Constitutional:  Positive for appetite change. Negative for chills and fever.  Respiratory:  Positive for cough and shortness of breath.   Cardiovascular:  Positive for chest pain.  Gastrointestinal:  Negative for diarrhea, nausea and vomiting.  Genitourinary:  Negative for dysuria.  Musculoskeletal:  Negative for arthralgias.  Skin:  Negative for rash.  Neurological:  Positive for weakness (generalized weakness). Negative for numbness and headaches.    Physical Exam Updated Vital Signs BP (!) 142/60   Pulse 78   Temp 97.8 F (36.6 C) (Oral)   Resp (!) 21   Ht 5\' 1"  (1.549 m)   Wt 79.8 kg   SpO2 94%   BMI 33.25 kg/m   Physical Exam Vitals and nursing note reviewed.  Constitutional:      General: She is not in acute distress.    Appearance: Normal appearance. She is well-developed. She is not ill-appearing.  Neck:     Meningeal: Kernig's sign absent.  Cardiovascular:     Rate and Rhythm: Normal rate and regular rhythm.     Heart sounds: Normal heart sounds.  Pulmonary:     Effort: Pulmonary effort is normal. No respiratory distress.     Breath sounds: No wheezing.  Abdominal:     Palpations: Abdomen is soft.     Tenderness: There is no abdominal tenderness. There is no guarding or rebound.  Musculoskeletal:  General: Normal range of motion.     Right lower leg: No edema.     Left lower leg: No edema.  Skin:    General: Skin is warm.     Capillary Refill: Capillary refill takes less than 2 seconds.     Findings: No rash.  Neurological:     Mental Status: She is alert and oriented to person, place, and time.     ED Results / Procedures / Treatments   Labs (all labs ordered are listed, but only abnormal results are displayed) Labs Reviewed  COMPREHENSIVE METABOLIC PANEL - Abnormal; Notable for the following components:      Result Value   Sodium 133 (*)    Chloride 97 (*)    Glucose, Bld 152 (*)    Creatinine, Ser 1.09 (*)    Calcium 8.7 (*)    Albumin 3.4 (*)    GFR, Estimated 49 (*)    All other components within normal limits  CBC WITH DIFFERENTIAL/PLATELET - Abnormal; Notable for the following components:   RBC 3.83 (*)    Hemoglobin 8.9 (*)    HCT 31.2 (*)    MCH 23.2 (*)    MCHC 28.5 (*)    RDW 21.0 (*)    Platelets 479 (*)    Abs Immature Granulocytes 0.22 (*)    All other components within normal limits  BRAIN NATRIURETIC PEPTIDE - Abnormal; Notable for the following components:   B  Natriuretic Peptide 387.0 (*)    All other components within normal limits  URINALYSIS, ROUTINE W REFLEX MICROSCOPIC - Abnormal; Notable for the following components:   Color, Urine STRAW (*)    All other components within normal limits  RAPID URINE DRUG SCREEN, HOSP PERFORMED - Abnormal; Notable for the following components:   Benzodiazepines POSITIVE (*)    All other components within normal limits  TROPONIN I (HIGH SENSITIVITY)  TROPONIN I (HIGH SENSITIVITY)    EKG EKG Interpretation  Date/Time:  Wednesday November 17 2020 11:55:12 EDT Ventricular Rate:  101 PR Interval:    QRS Duration: 76 QT Interval:  342 QTC Calculation: 443 R Axis:   45 Text Interpretation: Atrial fibrillation with rapid ventricular response Low voltage QRS ST & T wave abnormality, consider inferior ischemia Abnormal ECG No significant change since last tracing Rate slower Confirmed by Calvert Cantor [phone]) on 11/17/2020 11:58:42 AM  Radiology DG Chest 1 View  Result Date: 11/17/2020 CLINICAL DATA:  Shortness of breath, chest pain, chills and generalized weakness since yesterday morning. EXAM: CHEST  1 VIEW COMPARISON:  Chest x-ray 09/29/2020 and chest CT 05/15/2019 FINDINGS: The heart is mildly enlarged. Stable tortuosity and calcification of the thoracic aorta. Slight vascular prominence, peribronchial thickening and increased interstitial markings suspicious for pulmonary edema. Bronchitis would be another possibility. No focal infiltrates or pleural effusions. Stable mild eventration of the right hemidiaphragm. No focal lesions or pneumothorax. The bony thorax is intact.  Remote healed rib fractures are noted. IMPRESSION: Findings suggest CHF or bronchitis.  No infiltrates or effusions. Electronically Signed   By: Marijo Sanes M.D.   On: 11/17/2020 14:18   CT HEAD WO CONTRAST (5MM)  Result Date: 11/17/2020 CLINICAL DATA:  New onset confusion starting 11/16/2020. Altered mental status. EXAM: CT HEAD WITHOUT  CONTRAST TECHNIQUE: Contiguous axial images were obtained from the base of the skull through the vertex without intravenous contrast. COMPARISON:  CT head 03/24/2020 FINDINGS: Brain: The brainstem, cerebellum, cerebral peduncles, thalami, basal ganglia, basilar cisterns, and ventricular system appear  within normal limits. Periventricular white matter and corona radiata hypodensities favor chronic ischemic microvascular white matter disease. No intracranial hemorrhage, mass lesion, or acute CVA. Vascular: There is atherosclerotic calcification of the cavernous carotid arteries bilaterally. Skull: Unremarkable Sinuses/Orbits: Subtotal opacification of the right maxillary sinus compatible with chronic sinusitis. Chronic right greater than left ethmoid sinusitis. Left mastoid effusion. No middle ear fluid is identified. Other: No supplemental non-categorized findings. IMPRESSION: 1. No acute intracranial findings. 2. Periventricular white matter and corona radiata hypodensities favor chronic ischemic microvascular white matter disease. 3. Chronic right maxillary and right greater than left ethmoid sinusitis. Left mastoid effusion. Electronically Signed   By: Van Clines M.D.   On: 11/17/2020 14:50    Procedures Procedures   Medications Ordered in ED Medications - No data to display  ED Course  I have reviewed the triage vital signs and the nursing notes.  Pertinent labs & imaging results that were available during my care of the patient were reviewed by me and considered in my medical decision making (see chart for details).  Clinical Course as of 11/20/20 2215  Wed Nov 17, 2020  1405 EKG: Rate 101, Afib, artifact, baseline wander, t wave flattening, No significant change from previous, rate is slower.  [CS]    Clinical Course User Index [CS] Truddie Hidden, MD   MDM Rules/Calculators/A&P                           Patient has some labored breathing but able to speak in complete  sentences.  Daughter states that her breathing is similar to baseline.  No hypoxia.  Work-up today includes EKG which shows A. fib without acute changes.  Chest x-ray suggestive of CHF without evidence of infiltrates.  BNP slightly elevated at 387, troponin reassuring.  Chemistries show a improving serum creatinine.  No leukocytosis, hemoglobin 8.9 today improved from 8.3 yesterday.  Patient was given IV Lasix here with some relief of symptoms.  No hypoxia.  Speaking with patient in the room she is alert and oriented.  No focal neurodeficits.  No concerning sx's for sepsis.  Systolic pressure greater than 100.  CT head without acute findings.  Patient is nontoxic-appearing.  Patient also seen by Dr. Roderic Palau and care plan discussed.  Patient has follow-up appointment with her cardiologist, Dr. Harl Bowie tomorrow 11/18/2020.  She will continue her increased Lasix dose until seen by cardiology.    Final Clinical Impression(s) / ED Diagnoses Final diagnoses:  Acute on chronic congestive heart failure, unspecified heart failure type [facility])    Rx / DC Orders ED Discharge Orders     None        Bufford Lope 11/20/20 2247    Milton Ferguson, MD 11/21/20 [phone]

## 2020-11-17 NOTE — Telephone Encounter (Signed)
Cyril Mourning, here are the capsule results on the patient you have been following. Please provide additional recommendations. Thanks.   Several nonbleeding erosions noted at the pylorus, proximal small bowel.  Significance unclear but could possibly contribute to occult GI bleeding in the setting of anticoagulation.  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  No masses.    Avoid NSAIDs. Monitor H&H closely. Further recommendations from Aliene Altes, PA-C, ordering provider.

## 2020-11-17 NOTE — ED Provider Notes (Signed)
Emergency Medicine Provider Triage Evaluation Note  [name] , a 87 y.o. female with past medical history of COPD, CHF, CKD, anemia, atrial fibrillation was evaluated in triage.  Admitted to this hospital in June for hypoxia and respiratory failure secondary to COVID, patient here today with complaints of central chest pain and shortness of breath with cough.  Patient's daughter states that she has had a frequent, mostly nonproductive cough for several days.  Daughter states that she has been confused since yesterday.  Patient admits to decreased appetite and fatigue.  Daughter states that she is supposed to use a nebulizer at home but does not.  Does not require supplemental oxygen.  Daughter also states that she believes her mother mixed up her daily medications on Sunday.  Review of Systems  Positive: Cough, chest pain, shortness of breath, lower extremity edema Negative: Fever, abdominal pain, vomiting or diarrhea.  Physical Exam  BP (!) 141/85 (BP Location: Right Arm)   Pulse 85   Temp 97.8 F (36.6 C) (Oral)   Resp 20   Ht 5\' 1"  (1.549 m)   Wt 79.8 kg   SpO2 96%   BMI 33.25 kg/m  Gen:   Awake, alert Resp:  Slightly increased work of breathing, crackles present in both bases. MSK:               pitting edema of the bilateral lower extremities Other:  Abdomen soft nontender.  Medical Decision Making  Medically screening exam initiated at 1:15 PM.  Appropriate orders placed.  [name] was informed that the remainder of the evaluation will be completed by another provider, this initial triage assessment does not replace that evaluation, and the importance of remaining in the ED until their evaluation is complete.  Patient here with from home with daughter who provides most of the history.  States patient has been confused since yesterday, daughter believes patient mixed up her medications 2 days ago.  Complains of shortness of breath, mid chest pain and cough.  Vital signs  without evidence of hypoxia or tachycardia.  Patient appears stable at this time.  Diagnosed with COVID in June and hospitalized.  Does not use supplemental oxygen at home.  She will need further evaluation emergency department, patient and family agreeable to plan.     Kem Parkinson, PA-C 11/17/20 1323    Truddie Hidden, MD 11/17/20 [phone]

## 2020-11-17 NOTE — ED Notes (Signed)
Pt cleansed and changed, purick in place

## 2020-11-18 ENCOUNTER — Telehealth: Payer: Self-pay | Admitting: *Deleted

## 2020-11-18 ENCOUNTER — Telehealth: Payer: Self-pay | Admitting: Cardiology

## 2020-11-18 LAB — SARS CORONAVIRUS 2 (TAT 6-24 HRS): SARS Coronavirus 2: NEGATIVE

## 2020-11-18 NOTE — Telephone Encounter (Signed)
Already has OV scheduled for 11/24/2020 with Branch.

## 2020-11-18 NOTE — Telephone Encounter (Signed)
Patient was seen in [place] yesterday because she was disoriented and SOB. She took her to ED after she had lab work yesterday. After 9hrs they released her with congestive heart failure and suggested she f/u with Dr. Harl Bowie. She has fluid around her heart. Her daughter thinks she has not been taking 60mg  of Lasix like she should have. She needs to know if they should reduce or keep taking the 60mg . She said Dr branch/her Gertie Fey Dr took off her off Eliquis for 48 hrs due to HEM/rectal bleeding.

## 2020-11-18 NOTE — Telephone Encounter (Signed)
Noted that Eliquis is on hold per GI.

## 2020-11-18 NOTE — Telephone Encounter (Signed)
Would resume her oral lasix 60mg  daily until f/u, needs to take daily without missed doses  Zandra Abts MD

## 2020-11-18 NOTE — Telephone Encounter (Signed)
Daughter Jenne Sellinger) notified and verbalized understanding.

## 2020-11-18 NOTE — Telephone Encounter (Signed)
Spoke to patient's  daughter Otila Kluver [facility]). She wanted to inform Cyril Mourning that her mothers bleeding had slowed down tremendously. Only a little blood when she has a bowel movement. Daughter stated mother has an appt. With Dr. Arnoldo Morale on Aug 11. Will call after appt to let you know what they decided.

## 2020-11-18 NOTE — Telephone Encounter (Signed)
Left message to return call 

## 2020-11-19 NOTE — Telephone Encounter (Signed)
Tried calling patient's daughter back this afternoon, but was unable to reach her. We will need to try again on Monday morning.

## 2020-11-19 NOTE — Telephone Encounter (Signed)
Tried calling patient today to follow-up and to relay Capsule results. Left message with patient's daughter. I will try to call again this afternoon. Would like to continue to hold Eliquis until bleeding has stopped for at least 48 hours and repeat H/H on Monday to ensure stability.

## 2020-11-22 ENCOUNTER — Other Ambulatory Visit: Payer: Self-pay | Admitting: *Deleted

## 2020-11-22 ENCOUNTER — Encounter: Payer: Self-pay | Admitting: Cardiology

## 2020-11-22 ENCOUNTER — Ambulatory Visit (INDEPENDENT_AMBULATORY_CARE_PROVIDER_SITE_OTHER): Payer: PPO | Admitting: Cardiology

## 2020-11-22 ENCOUNTER — Other Ambulatory Visit: Payer: Self-pay

## 2020-11-22 VITALS — BP 120/68 | HR 96 | Ht 61.0 in | Wt 170.0 lb

## 2020-11-22 DIAGNOSIS — K625 Hemorrhage of anus and rectum: Secondary | ICD-10-CM

## 2020-11-22 DIAGNOSIS — I5033 Acute on chronic diastolic (congestive) heart failure: Secondary | ICD-10-CM | POA: Diagnosis not present

## 2020-11-22 DIAGNOSIS — I4891 Unspecified atrial fibrillation: Secondary | ICD-10-CM

## 2020-11-22 MED ORDER — FUROSEMIDE 40 MG PO TABS: 60.0000 mg | ORAL_TABLET | Freq: Every day | ORAL | 3 refills | Status: DC

## 2020-11-22 NOTE — Telephone Encounter (Signed)
Faxed lab requisition to [facility].

## 2020-11-22 NOTE — Progress Notes (Signed)
Clinical Summary [name] is a 87 y.o.female seen today for a focused follow up for recent issues with SOB and edema.        1. Chronic diastolic HF   - 05/3760 echo LVEF 60-65%, no WMAs, indet diastolic fxn,  - no recent edema  ER visit 11/17/20 with SOB - CXR with some signs of edema, BNP 387 - has been taking lasix more regularly - we had increased lasix to 60mg  daily on 11/15/20 - today weight 170 lbs,  in July she was 173 lbs.     2. Persistent afib - Recent TEE/DCCV performed 11/10/2019 with 3 shocks resulting in normal sinus rhythm - from notes does better breathing wise in SR   - issues with afib with RVR during June 2022 admission with COVID - rates have normalized  - eliquis on hold due to recent GI bleeding, followed by GI     Past Medical History:  Diagnosis Date   Anxiety    Arthritis    Atrial fibrillation ([place])    Bursitis    Left shoulder   Cataract    CHF (congestive heart failure) ([place])    CKD (chronic kidney disease)    stage 3-4   COPD (chronic obstructive pulmonary disease) (HCC)    Coronary atherosclerosis of native coronary artery    a. s/p DES to LCx in 04/2013 b. cath in 11/2015 showing patent stent with 20% prox-LAD and 80% ostial RCA stenosis for which medical management was recommended due to small artery size   Depression    Diastolic heart failure (HCC)    EF 55-60%   Dysphagia, unspecified(787.20)    Dyspnea    Dysrhythmia    Essential hypertension    GERD (gastroesophageal reflux disease)    Hx Schatzki's ring, multiple EGD/ED last 01/06/2004   Gout    Headache    History of anemia    Hyperlipidemia    Internal hemorrhoids without mention of complication    MI (myocardial infarction) ([place]) 2006   Microscopic colitis 2003   Panic disorder without agoraphobia    Paresthesia    Pneumonia 12/2011   PVD (peripheral vascular disease) (HCC)    S/P colonoscopy 09/27/2001   internal hemorrhoids, desc colon inflam polyp, SB  BX-chronic duodenitis, colitis   Sleep apnea    Thyroid disease      Allergies  Allergen Reactions   Cephalosporins Diarrhea and Nausea Only    Lightheaded   Levaquin [Levofloxacin In D5w] Swelling   Macrodantin [Nitrofurantoin Macrocrystal] Swelling   Phenothiazines Anaphylaxis and Hives   Polysorbate Anaphylaxis   Prednisone Shortness Of Breath   Buspirone Itching   Cardura [Doxazosin Mesylate] Itching   Codeine Itching   Acyclovir And Related Itching    Redness of skin   Colcrys [Colchicine] Nausea Only    Upset stomach    Prochlorperazine Other (See Comments)    "Upset stomach"   Ranexa [Ranolazine]     Severe drop in BP   Atorvastatin Hives    Cramping; tolerates Crestor ok   Colestipol Palpitations   Ofloxacin Rash   Other Itching and Rash    "WOOL"= make skin look like it has been burned   Penicillins Other (See Comments)    Causes redness all over. Has patient had a PCN reaction causing immediate rash, facial/tongue/throat swelling, SOB or lightheadedness with hypotension: No Has patient had a PCN reaction causing severe rash involving mucus membranes or skin necrosis: No Has patient had a  PCN reaction that required hospitalization No Has patient had a PCN reaction occurring within the last 10 years: No If all of the above answers are "NO", then may proceed with Cephalosporin use.    Pimozide Hives and Itching     Current Outpatient Medications  Medication Sig Dispense Refill   acetaminophen (TYLENOL) 500 MG tablet Take 500 mg by mouth every 6 (six) hours as needed for headache or moderate pain.     ALPRAZolam (XANAX) 0.5 MG tablet Take 0.5 mg by mouth daily as needed for anxiety or sleep.     apixaban (ELIQUIS) 5 MG TABS tablet Take 1 tablet (5 mg total) by mouth 2 (two) times daily. 180 tablet 3   ascorbic acid (VITAMIN C) 500 MG tablet Take 1 tablet (500 mg total) by mouth daily. 30 tablet 2   diltiazem (CARDIZEM CD) 120 MG 24 hr capsule TAKE ONE CAPSULE  BY MOUTH DAILY 90 capsule 1   DULoxetine (CYMBALTA) 60 MG capsule Take 60 mg by mouth at bedtime.     ferrous sulfate 325 (65 FE) MG tablet Take 1 tablet (325 mg total) by mouth daily with breakfast. 30 tablet 3   fluticasone (CUTIVATE) 0.05 % cream Apply 1 application topically 2 (two) times daily as needed (skin tears).     furosemide (LASIX) 40 MG tablet Take 1.5 tablets (60 mg total) by mouth daily. Dose changed 11/16/2020     gabapentin (NEURONTIN) 100 MG capsule Take 1 capsule (100 mg total) by mouth 4 (four) times daily. 120 capsule 11   hydrocortisone (ANUSOL-HC) 2.5 % rectal cream Place 1 application rectally 2 (two) times daily. 30 g 1   isosorbide mononitrate (IMDUR) 60 MG 24 hr tablet Take 60 mg by mouth daily.     magnesium oxide (MAG-OX) 400 MG tablet TAKE ONE TABLET BY MOUTH DAILY 90 tablet 3   metoprolol succinate (TOPROL-XL) 25 MG 24 hr tablet Take 75 mg by mouth daily.     Multiple Vitamins-Minerals (HAIR/SKIN/NAILS) CAPS Take 1 tablet by mouth daily.     Naphazoline HCl (CLEAR EYES OP) Place 1 drop into both eyes daily as needed (itching).     nitroGLYCERIN (NITROSTAT) 0.4 MG SL tablet Place 1 tablet (0.4 mg total) under the tongue every 5 (five) minutes as needed for chest pain. Reported on 08/04/2015 25 tablet 6   pantoprazole (PROTONIX) 40 MG tablet TAKE ONE TABLET BY MOUTH TWICE DAILY BEFORE MEALS 60 tablet 5   potassium chloride (KLOR-CON) 10 MEQ tablet Take 1 tablet (10 mEq total) by mouth daily. 180 tablet 1   triamcinolone cream (KENALOG) 0.1 % Apply topically 2 (two) times daily.     zinc sulfate 220 (50 Zn) MG capsule Take 1 capsule (220 mg total) by mouth daily. 30 capsule 3   No current facility-administered medications for this visit.     Past Surgical History:  Procedure Laterality Date   ABDOMINAL HYSTERECTOMY     ABDOMINAL HYSTERECTOMY     AGILE CAPSULE N/A 08/30/2020   Procedure: AGILE CAPSULE;  Surgeon: Daneil Dolin, MD;  [facility];   Service: Endoscopy;  Laterality: N/A;  7:30am   ANTERIOR AND POSTERIOR REPAIR     with resection of vagina   ANTERIOR LAT LUMBAR FUSION N/A 08/01/2016   Procedure: Lumbar Two-Lumbar Five Transpsoas lateral interbody fusion with Lumbar Two-Three lateral plate fixation;  Surgeon: Kevan Ny Ditty, MD;  Location: [place];  Service: Neurosurgery;  Laterality: N/A;  L2-5 Transpsoas lateral interbody fusion  with L2-3 lateral plate fixation   APPENDECTOMY     BACK SURGERY     BIOPSY  07/05/2015   Procedure: BIOPSY;  Surgeon: Daneil Dolin, MD;  [facility];  Service: Endoscopy;;  gastric polyp biopsy, ascending colon biopsy   BIOPSY  02/16/2020   Procedure: BIOPSY;  Surgeon: Daneil Dolin, MD;  [facility];  Service: Endoscopy;;   BIOPSY  06/28/2020   Procedure: BIOPSY;  Surgeon: Daneil Dolin, MD;  [facility];  Service: Endoscopy;;   BLADDER SUSPENSION  11/09/2011   Procedure: TRANSVAGINAL TAPE (TVT) PROCEDURE;  Surgeon: Marissa Nestle, MD;  [facility]: [facility];  Service: Urology;  Laterality: N/A;   bladder tack  06/2010   BREAST LUMPECTOMY  1998   left, benign   CARDIAC CATHETERIZATION     CARDIAC CATHETERIZATION     CARDIAC CATHETERIZATION N/A 12/16/2015   Procedure: Left Heart Cath and Coronary Angiography;  Surgeon: Troy Sine, MD;  Location: [place] CV LAB;  Service: Cardiovascular;  Laterality: N/A;   CARDIOVERSION N/A 10/04/2017   Procedure: CARDIOVERSION;  Surgeon: Herminio Commons, MD;  [facility];  Service: Cardiovascular;  Laterality: N/A;   CARDIOVERSION N/A 01/30/2018   Procedure: CARDIOVERSION;  Surgeon: Herminio Commons, MD;  [facility];  Service: Cardiovascular;  Laterality: N/A;   CARDIOVERSION N/A 11/10/2019   Procedure: CARDIOVERSION;  Surgeon: Josue Hector, MD;  Location: [facility] ENDOSCOPY;  Service: Cardiovascular;  Laterality: N/A;   [place]   left   cataract surgery      CHOLECYSTECTOMY  1998   Cholecystectomy     COLONOSCOPY  03/16/2011   multiple hyperplastic colon polyps, sigmoid diverticulosis, melanosis coli   COLONOSCOPY WITH PROPOFOL N/A 07/05/2015   RMR:one 5 mm polyp in descending colon   COLONOSCOPY WITH PROPOFOL N/A 06/28/2020   Procedure: COLONOSCOPY WITH PROPOFOL;  Surgeon: Daneil Dolin, MD;  [facility];  Service: Endoscopy;  Laterality: N/A;  am appt   CORONARY ANGIOGRAPHY N/A 05/16/2018   Procedure: CORONARY ANGIOGRAPHY (CATH LAB);  Surgeon: Belva Crome, MD;  Location: [place] CV LAB;  Service: Cardiovascular;  Laterality: N/A;   CORONARY ANGIOPLASTY WITH STENT PLACEMENT  2015   ESOPHAGEAL DILATION N/A 07/05/2015   Procedure: ESOPHAGEAL DILATION;  Surgeon: Daneil Dolin, MD;  [facility];  Service: Endoscopy;  Laterality: N/A;   ESOPHAGOGASTRODUODENOSCOPY (EGD) WITH PROPOFOL N/A 07/05/2015   MGQ:[id number]   ESOPHAGOGASTRODUODENOSCOPY (EGD) WITH PROPOFOL N/A 02/16/2020   Procedure: ESOPHAGOGASTRODUODENOSCOPY (EGD) WITH PROPOFOL;  Surgeon: Daneil Dolin, MD;  Normal examined esophagus s/p dilation, erythematous mucosa in the stomach s/p biopsy, normal examined duodenum. Pathology with mild gastropathy, negative for H. Pylori.    GIVENS CAPSULE STUDY N/A 11/10/2020   Procedure: GIVENS CAPSULE STUDY;  Surgeon: Daneil Dolin, MD;  [facility];  Service: Endoscopy;  Laterality: N/A;  7:30am   JOINT REPLACEMENT Right 2007   right knee   left hand surgery     LEFT HEART CATHETERIZATION WITH CORONARY ANGIOGRAM N/A 05/14/2013   Procedure: LEFT HEART CATHETERIZATION WITH CORONARY ANGIOGRAM;  Surgeon: Blane Ohara, MD;  Location: [facility] C[facility] LAB;  Service: Cardiovascular;  Laterality: N/A;   left rotator cuff surgery     LUMBAR LAMINECTOMY/DECOMPRESSION MICRODISCECTOMY N/A 10/11/2012   Procedure: LUMBAR LAMINECTOMY/DECOMPRESSION MICRODISCECTOMY 2 LEVELS;  Surgeon: Floyce Stakes, MD;  Location: [place] [facility]S;   Service: Neurosurgery;  Laterality: N/A;  L3-4 L4-5 Laminectomy   LUMBAR WOUND DEBRIDEMENT N/A 09/27/2015   Procedure: Exploration of Lumbar Wound w/ Repair CSF Leak/Lumbar Drain Placement;  Surgeon: Leeroy Cha, MD;  Location: [place] NEURO ORS;  Service: Neurosurgery;  Laterality: N/A;   MALONEY DILATION  03/16/2011   Gastritis. No H.pylori on bx. 23F maloney dilation with disruption of  occult cevical esophageal web   MALONEY DILATION N/A 02/16/2020   Procedure: MALONEY DILATION;  Surgeon: Daneil Dolin, MD;  [facility];  Service: Endoscopy;  Laterality: N/A;   NASAL SINUS SURGERY     right knee replacement  2007   right leg benign tumor     SHOULDER SURGERY Left    TEE WITHOUT CARDIOVERSION N/A 10/04/2017   Procedure: TRANSESOPHAGEAL ECHOCARDIOGRAM (TEE) WITH PROPOFOL;  Surgeon: Herminio Commons, MD;  [facility];  Service: Cardiovascular;  Laterality: N/A;   TEE WITHOUT CARDIOVERSION N/A 11/10/2019   Procedure: TRANSESOPHAGEAL ECHOCARDIOGRAM (TEE);  Surgeon: Josue Hector, MD;  Location[facility] ENDOSCOPY;  Service: Cardiovascular;  Laterality: N/A;   TONSILLECTOMY     unspecified area, hysterectomy  1972   partial     Allergies  Allergen Reactions   Cephalosporins Diarrhea and Nausea Only    Lightheaded   Levaquin [Levofloxacin In D5w] Swelling   Macrodantin [Nitrofurantoin Macrocrystal] Swelling   Phenothiazines Anaphylaxis and Hives   Polysorbate Anaphylaxis   Prednisone Shortness Of Breath   Buspirone Itching   Cardura [Doxazosin Mesylate] Itching   Codeine Itching   Acyclovir And Related Itching    Redness of skin   Colcrys [Colchicine] Nausea Only    Upset stomach    Prochlorperazine Other (See Comments)    "Upset stomach"   Ranexa [Ranolazine]     Severe drop in BP   Atorvastatin Hives    Cramping; tolerates Crestor ok   Colestipol Palpitations   Ofloxacin Rash   Other Itching and Rash    "WOOL"= make skin look like it has been burned    Penicillins Other (See Comments)    Causes redness all over. Has patient had a PCN reaction causing immediate rash, facial/tongue/throat swelling, SOB or lightheadedness with hypotension: No Has patient had a PCN reaction causing severe rash involving mucus membranes or skin necrosis: No Has patient had a PCN reaction that required hospitalization No Has patient had a PCN reaction occurring within the last 10 years: No If all of the above answers are "NO", then may proceed with Cephalosporin use.    Pimozide Hives and Itching      Family History  Problem Relation Age of Onset   Stroke Mother    Parkinson's disease Father    Coronary artery disease Other        family Hx-sons   Cancer Other    Stroke Other        family Hx   Hypertension Other        family Hx   Diabetes Brother    Heart disease Son        before age 4   Diabetes Son    Stroke Daughter 58   Colon cancer Grandson        diagnosed 37   Inflammatory bowel disease Neg Hx      Social History [name] reports that she quit smoking about 19 years ago. Her smoking use included cigarettes. She started smoking about 72 years ago. She has a 64.00 pack-year smoking history. She has never used smokeless tobacco. [name] reports previous alcohol  use.   Review of Systems CONSTITUTIONAL: No weight loss, fever, chills, weakness or fatigue.  HEENT: Eyes: No visual loss, blurred vision, double vision or yellow sclerae.No hearing loss, sneezing, congestion, runny nose or sore throat.  SKIN: No rash or itching.  CARDIOVASCULAR: per hpi RESPIRATORY: per hpi GASTROINTESTINAL: No anorexia, nausea, vomiting or diarrhea. No abdominal pain or blood.  GENITOURINARY: No burning on urination, no polyuria NEUROLOGICAL: No headache, dizziness, syncope, paralysis, ataxia, numbness or tingling in the extremities. No change in bowel or bladder control.  MUSCULOSKELETAL: No muscle, back pain, joint pain or stiffness.  LYMPHATICS:  No enlarged nodes. No history of splenectomy.  PSYCHIATRIC: No history of depression or anxiety.  ENDOCRINOLOGIC: No reports of sweating, cold or heat intolerance. No polyuria or polydipsia.  [name] Kitchen   Physical Examination Vitals:   11/22/20 1119  BP: 120/68  Pulse: 96  SpO2: 95%   Filed Weights   11/22/20 1119  Weight: 170 lb (77.1 kg)    Gen: resting comfortably, no acute distress HEENT: no scleral icterus, pupils equal round and reactive, no palptable cervical adenopathy,  CV: RRR, no m/r/g no jvd Resp: faint crackles bilaterally GI: abdomen is soft, non-tender, non-distended, normal bowel sounds, no hepatosplenomegaly MSK: extremities are warm, no edema.  Skin: warm, no rash Neuro:  no focal deficits Psych: appropriate affect   Diagnostic Studies Cardiac Catheterization: 13-Jun-2018 Patent short left main 20 to 30% proximal LAD irregularity, with diffuse 20% narrowing within the mid vessel. Patent proximal circumflex stent with mid to distal circumflex irregularities up to 30 to 40%. Nondominant right coronary with ostial 90% narrowing. No hemodynamics recorded.  Left ventriculography was not performed in an effort to decrease contrast exposure in the setting of CKD.   RECOMMENDATIONS:   Compared to prior angiography in 2017, no significant changes noted. Chest pain is atypical and may be more musculoskeletal or pleuritic. Resume Eliquis in 12 hours/a.m. of 05/17/2018   Echocardiogram 10/02/18:   1. The left ventricle has normal systolic function with an ejection  fraction of 60-65%. The cavity size was normal. There is mildly increased  left ventricular wall thickness. Left ventricular diastolic Doppler  parameters are consistent with impaired  relaxation. Elevated mean left atrial pressure.   2. The right ventricle has normal systolic function. The cavity was  normal. There is no increase in right ventricular wall thickness.   3. Left atrial size was mildly dilated.    4. The aortic valve is tricuspid. Mild thickening of the aortic valve.  Mild calcification of the aortic valve. Aortic valve regurgitation is mild  by color flow Doppler. No stenosis of the aortic valve. Mild aortic  annular calcification noted.   5. The mitral valve is abnormal. Mild thickening of the mitral valve  leaflet. Mild calcification of the mitral valve leaflet. There is mild  mitral annular calcification present. No evidence of mitral valve  stenosis.   6. The aortic root is normal in size and structure.   7. Pulmonary hypertension is indeterminant, inadequate TR jet.      Event monitor 05/13/19:   1. NSR 2. NSVT, lasting upto 6 beats 3. No prolonged pauses or bradycardia 4. No atrial fib     09/2019 echo 1. Left ventricular ejection fraction, by estimation, is 60 to 65%. The  left ventricle has normal function. The left ventricle has no regional  wall motion abnormalities. There is mild left ventricular hypertrophy.  Left ventricular diastolic parameters  are indeterminate.   2. Right ventricular  systolic function is normal. The right ventricular  size is normal. There is normal pulmonary artery systolic pressure.   3. The mitral valve is normal in structure. Trivial mitral valve  regurgitation. No evidence of mitral stenosis.   4. The aortic valve was not well visualized. Aortic valve regurgitation  is mild. No aortic stenosis is present.   05/2020 echo IMPRESSIONS     1. Left ventricular ejection fraction, by estimation, is 60 to 65%. The  left ventricle has normal function. The left ventricle has no regional  wall motion abnormalities. Left ventricular diastolic parameters are  indeterminate.   2. Right ventricular systolic function is normal. The right ventricular  size is normal.   3. The mitral valve is normal in structure. No evidence of mitral valve  regurgitation. No evidence of mitral stenosis.   4. The aortic valve has an indeterminant number of  cusps. There is mild  calcification of the aortic valve. There is mild thickening of the aortic  valve. Aortic valve regurgitation is trivial. No aortic stenosis is  present.   5. The inferior vena cava is normal in size with greater than 50%  respiratory variability, suggesting right atrial pressure of 3 mmHg.   05/2020 ABI Summary:  Right: Resting right ankle-brachial index indicates moderate right lower  extremity arterial disease. The right toe-brachial index is abnormal. RT  great toe pressure = 74 mmHg.   Left: Resting left ankle-brachial index indicates moderate left lower  extremity arterial disease. The left toe-brachial index is abnormal. LT  Great toe pressure = 73 mmHg.    Assessment and Plan  1.Acute on chronic diastolic HF - still some signs of fluid overload, continue her lasix 60mg  daily - repeat bmet/mg/bnp  2. Afib - off eliquis due to recent GI bleeding, restart when ok with GI      Arnoldo Lenis, M.D.

## 2020-11-22 NOTE — Telephone Encounter (Signed)
Spoke with patient's daughter this morning.  Bleeding continues to improve.  States she is only having very little rectal bleeding when having a bowel movement.  Stools are soft.  She has an appointment with Dr. Harl Bowie today for follow-up.  Discussed recommendations to repeat blood work.  She is requesting to have this completed tomorrow at [facility], please arrange CBC to be completed at [facility].

## 2020-11-22 NOTE — Patient Instructions (Addendum)
Medication Instructions:  Continue all current medications.  Labwork: BMET, BNP, Mg - orders given today.  Office will contact with results via phone or letter.    Testing/Procedures: none  Follow-Up: 3 months   Any Other Special Instructions Will Be Listed Below (If Applicable).  If you need a refill on your cardiac medications before your next appointment, please call your pharmacy.

## 2020-11-23 DIAGNOSIS — K625 Hemorrhage of anus and rectum: Secondary | ICD-10-CM | POA: Diagnosis not present

## 2020-11-23 DIAGNOSIS — T182XXA Foreign body in stomach, initial encounter: Secondary | ICD-10-CM | POA: Diagnosis not present

## 2020-11-23 DIAGNOSIS — T189XXA Foreign body of alimentary tract, part unspecified, initial encounter: Secondary | ICD-10-CM | POA: Diagnosis not present

## 2020-11-23 DIAGNOSIS — Z9889 Other specified postprocedural states: Secondary | ICD-10-CM | POA: Diagnosis not present

## 2020-11-23 DIAGNOSIS — M545 Low back pain, unspecified: Secondary | ICD-10-CM | POA: Diagnosis not present

## 2020-11-24 ENCOUNTER — Telehealth: Payer: Self-pay | Admitting: Gastroenterology

## 2020-11-24 DIAGNOSIS — D509 Iron deficiency anemia, unspecified: Secondary | ICD-10-CM

## 2020-11-24 NOTE — Telephone Encounter (Signed)
Reviewed. No additional recommendations at this time. We are currently dealing with bleeding hemorrhoids which are likely contributing to her anemia in the setting of anticoagulation. Eliquis is on hold at this time. She has appt with general surgery on 8/11. We are monitoring hemoglobin closely. If hemoglobin continues to decline despite correction of hemorrhoidal bleeding and oral iron supplementation, may need to consider referral to hematology. Doubt repeat EGD would be very helpful, but could be considered in the future if needed.

## 2020-11-24 NOTE — Addendum Note (Signed)
Addended by: Cheron Every on: 11/24/2020 01:39 PM   Modules accepted: Orders

## 2020-11-24 NOTE — Telephone Encounter (Signed)
Referral placed.

## 2020-11-24 NOTE — Telephone Encounter (Signed)
Recommend we refer to hematology for IV iron as she is not able to tolerate oral iron. IV iron doesn't cause the side effect of constipation. She needs iron supplementation due to iron deficiency anemia.   [facility] Clinical Pool: Please arrange referral to hematology for IV iron. Dx: IDA.

## 2020-11-24 NOTE — Telephone Encounter (Signed)
Spoke to patients daughter Otila Kluver [facility]), Informed her of lab results and recommendations. Daughter stated that mother has had no bleeding and that she is not taking iron. States iron makes her stool hard. Informed me that she is going to see surgeon 8/11.

## 2020-11-24 NOTE — Telephone Encounter (Signed)
[name]:  Please let patient/patient's daughter know that I have reviewed her blood work completed yesterday.  Her hemoglobin is low, but fairly stable at 8.6.  If she still having rectal bleeding?  If not, when was the last time this occurred?  She should be taking iron daily. Please verify.   Keep upcoming appointment with general surgery scheduled for 8/11.

## 2020-11-25 ENCOUNTER — Other Ambulatory Visit: Payer: Self-pay

## 2020-11-25 ENCOUNTER — Ambulatory Visit: Payer: PPO | Admitting: General Surgery

## 2020-11-25 ENCOUNTER — Encounter: Payer: Self-pay | Admitting: General Surgery

## 2020-11-25 VITALS — BP 124/60 | HR 89 | Temp 98.1°F | Resp 16 | Ht 61.0 in | Wt 171.2 lb

## 2020-11-25 DIAGNOSIS — K649 Unspecified hemorrhoids: Secondary | ICD-10-CM | POA: Diagnosis not present

## 2020-11-26 NOTE — Progress Notes (Signed)
[name]; [id number]; [birth year]   HPI Patient is an 87 year old white female with multiple medical problems who was referred to my care by Clemmie Krill for evaluation and treatment of bleeding hemorrhoids.  She has been followed by [facility] gastroenterology.  She has a known history of hemorrhoidal disease.  She is also taking Eliquis for atrial fibrillation.  She has been off Eliquis for the past few weeks.  She has not had any rectal bleeding recently.  She does have a history of constipation and her bowel movements vary significantly.  She does primarily ambulate with a wheelchair.  She has never had hemorrhoidal surgery.  She had a Givens capsule study which revealed some nonbleeding erosions at the pylorus and proximal small bowel.  Several lymphangiectasia's were noted in the distal small bowel.  No masses were noted.  She has been chronically anemic.  She has been noted to be iron deficient, but has not undergone any recent iron infusions.  Colonoscopy done in March 2022 revealed internal and external hemorrhoids.  They were not actively bleeding at the time. Past Medical History:  Diagnosis Date   Anxiety    Arthritis    Atrial fibrillation (HCC)    Bursitis    Left shoulder   Cataract    CHF (congestive heart failure) (HCC)    CKD (chronic kidney disease)    stage 3-4   COPD (chronic obstructive pulmonary disease) (HCC)    Coronary atherosclerosis of native coronary artery    a. s/p DES to LCx in 04/2013 b. cath in 11/2015 showing patent stent with 20% prox-LAD and 80% ostial RCA stenosis for which medical management was recommended due to small artery size   Depression    Diastolic heart failure (HCC)    EF 55-60%   Dysphagia, unspecified(787.20)    Dyspnea    Dysrhythmia    Essential hypertension    GERD (gastroesophageal reflux disease)    Hx Schatzki's ring, multiple EGD/ED last 01/06/2004   Gout    Headache    History of anemia    Hyperlipidemia    Internal  hemorrhoids without mention of complication    MI (myocardial infarction) ([place]) 2006   Microscopic colitis 2003   Panic disorder without agoraphobia    Paresthesia    Pneumonia 12/2011   PVD (peripheral vascular disease) (HCC)    S/P colonoscopy 09/27/2001   internal hemorrhoids, desc colon inflam polyp, SB BX-chronic duodenitis, colitis   Sleep apnea    Thyroid disease     Past Surgical History:  Procedure Laterality Date   ABDOMINAL HYSTERECTOMY     ABDOMINAL HYSTERECTOMY     AGILE CAPSULE N/A 08/30/2020   Procedure: AGILE CAPSULE;  Surgeon: Daneil Dolin, MD;  [facility];  Service: Endoscopy;  Laterality: N/A;  7:30am   ANTERIOR AND POSTERIOR REPAIR     with resection of vagina   ANTERIOR LAT LUMBAR FUSION N/A 08/01/2016   Procedure: Lumbar Two-Lumbar Five Transpsoas lateral interbody fusion with Lumbar Two-Three lateral plate fixation;  Surgeon: Kevan Ny Ditty, MD;  Location: [place];  Service: Neurosurgery;  Laterality: N/A;  L2-5 Transpsoas lateral interbody fusion with L2-3 lateral plate fixation   APPENDECTOMY     BACK SURGERY     BIOPSY  07/05/2015   Procedure: BIOPSY;  Surgeon: Daneil Dolin, MD;  [facility];  Service: Endoscopy;;  gastric polyp biopsy, ascending colon biopsy   BIOPSY  02/16/2020   Procedure: BIOPSY;  Surgeon: Manus Rudd  M, MD;  [facility];  Service: Endoscopy;;   BIOPSY  06/28/2020   Procedure: BIOPSY;  Surgeon: Daneil Dolin, MD;  [facility];  Service: Endoscopy;;   BLADDER SUSPENSION  11/09/2011   Procedure: TRANSVAGINAL TAPE (TVT) PROCEDURE;  Surgeon: Marissa Nestle, MD;  [facility];  Service: Urology;  Laterality: N/A;   bladder tack  06/2010   BREAST LUMPECTOMY  1998   left, benign   CARDIAC CATHETERIZATION     CARDIAC CATHETERIZATION     CARDIAC CATHETERIZATION N/A 12/16/2015   Procedure: Left Heart Cath and Coronary Angiography;  Surgeon: Troy Sine, MD;  Location: [place]  CV LAB;  Service: Cardiovascular;  Laterality: N/A;   CARDIOVERSION N/A 10/04/2017   Procedure: CARDIOVERSION;  Surgeon: Herminio Commons, MD;  [facility];  Service: Cardiovascular;  Laterality: N/A;   CARDIOVERSION N/A 01/30/2018   Procedure: CARDIOVERSION;  Surgeon: Herminio Commons, MD;  [facility];  Service: Cardiovascular;  Laterality: N/A;   CARDIOVERSION N/A 11/10/2019   Procedure: CARDIOVERSION;  Surgeon: Josue Hector, MD;  Location: [facility] ENDOSCOPY;  Service: Cardiovascular;  Laterality: N/A;   [place]   left   cataract surgery     CHOLECYSTECTOMY  1998   Cholecystectomy     COLONOSCOPY  03/16/2011   multiple hyperplastic colon polyps, sigmoid diverticulosis, melanosis coli   COLONOSCOPY WITH PROPOFOL N/A 07/05/2015   RMR:one 5 mm polyp in descending colon   COLONOSCOPY WITH PROPOFOL N/A 06/28/2020   Procedure: COLONOSCOPY WITH PROPOFOL;  Surgeon: Daneil Dolin, MD;  [facility];  Service: Endoscopy;  Laterality: N/A;  am appt   CORONARY ANGIOGRAPHY N/A 05/16/2018   Procedure: CORONARY ANGIOGRAPHY (CATH LAB);  Surgeon: Belva Crome, MD;  Location: [place] [facility];  Service: Cardiovascular;  Laterality: N/A;   CORONARY ANGIOPLASTY WITH STENT PLACEMENT  2015   ESOPHAGEAL DILATION N/A 07/05/2015   Procedure: ESOPHAGEAL DILATION;  Surgeon: Daneil Dolin, MD;  [facility];  Service: Endoscopy;  Laterality: N/A;   ESOPHAGOGASTRODUODENOSCOPY (EGD) WITH PROPOFOL N/A 07/05/2015   NGE:[id number]   ESOPHAGOGASTRODUODENOSCOPY (EGD) WITH PROPOFOL N/A 02/16/2020   Procedure: ESOPHAGOGASTRODUODENOSCOPY (EGD) WITH PROPOFOL;  Surgeon: Daneil Dolin, MD;  Normal examined esophagus s/p dilation, erythematous mucosa in the stomach s/p biopsy, normal examined duodenum. Pathology with mild gastropathy, negative for H. Pylori.    GIVENS CAPSULE STUDY N/A 11/10/2020   Procedure: GIVENS CAPSULE STUDY;  Surgeon: Daneil Dolin, MD;   [facility];  Service: Endoscopy;  Laterality: N/A;  7:30am   JOINT REPLACEMENT Right 2007   right knee   left hand surgery     LEFT HEART CATHETERIZATION WITH CORONARY ANGIOGRAM N/A 05/14/2013   Procedure: LEFT HEART CATHETERIZATION WITH CORONARY ANGIOGRAM;  Surgeon: Blane Ohara, MD;  Location: [facility] CATH LAB;  Service: Cardiovascular;  Laterality: N/A;   left rotator cuff surgery     LUMBAR LAMINECTOMY/DECOMPRESSION MICRODISCECTOMY N/A 10/11/2012   Procedure: LUMBAR LAMINECTOMY/DECOMPRESSION MICRODISCECTOMY 2 LEVELS;  Surgeon: Floyce Stakes, MD;  Location: [place] [facility];  Service: Neurosurgery;  Laterality: N/A;  L3-4 L4-5 Laminectomy   LUMBAR WOUND DEBRIDEMENT N/A 09/27/2015   Procedure: Exploration of Lumbar Wound w/ Repair CSF Leak/Lumbar Drain Placement;  Surgeon: Leeroy Cha, MD;  Location: [place] NEURO ORS;  Service: Neurosurgery;  Laterality: N/A;   MALONEY DILATION  03/16/2011   Gastritis. No H.pylori on bx. 35F maloney dilation with disruption of  occult cevical esophageal web   MALONEY DILATION N/A 02/16/2020   Procedure: Venia Minks DILATION;  Surgeon: Daneil Dolin, MD;  [facility];  Service: Endoscopy;  Laterality: N/A;   NASAL SINUS SURGERY     right knee replacement  2007   right leg benign tumor     SHOULDER SURGERY Left    TEE WITHOUT CARDIOVERSION N/A 10/04/2017   Procedure: TRANSESOPHAGEAL ECHOCARDIOGRAM (TEE) WITH PROPOFOL;  Surgeon: Herminio Commons, MD;  [facility];  Service: Cardiovascular;  Laterality: N/A;   TEE WITHOUT CARDIOVERSION N/A 11/10/2019   Procedure: TRANSESOPHAGEAL ECHOCARDIOGRAM (TEE);  Surgeon: Josue Hector, MD;  Location: [facility] ENDOSCOPY;  Service: Cardiovascular;  Laterality: N/A;   TONSILLECTOMY     unspecified area, hysterectomy  1972   partial    Family History  Problem Relation Age of Onset   Stroke Mother    Parkinson's disease Father    Coronary artery disease Other        family Hx-sons   Cancer Other     Stroke Other        family Hx   Hypertension Other        family Hx   Diabetes Brother    Heart disease Son        before age 58   Diabetes Son    Stroke Daughter 31   Colon cancer Grandson        diagnosed 54   Inflammatory bowel disease Neg Hx     Current Outpatient Medications on File Prior to Visit  Medication Sig Dispense Refill   acetaminophen (TYLENOL) 500 MG tablet Take 500 mg by mouth every 6 (six) hours as needed for headache or moderate pain.     ALPRAZolam (XANAX) 0.5 MG tablet Take 0.5 mg by mouth daily as needed for anxiety or sleep.     ascorbic acid (VITAMIN C) 500 MG tablet Take 1 tablet (500 mg total) by mouth daily. 30 tablet 2   diltiazem (CARDIZEM CD) 120 MG 24 hr capsule TAKE ONE CAPSULE BY MOUTH DAILY 90 capsule 1   DULoxetine (CYMBALTA) 60 MG capsule Take 60 mg by mouth at bedtime.     ferrous sulfate 325 (65 FE) MG tablet Take 1 tablet (325 mg total) by mouth daily with breakfast. 30 tablet 3   fluticasone (CUTIVATE) 0.05 % cream Apply 1 application topically 2 (two) times daily as needed (skin tears).     furosemide (LASIX) 40 MG tablet Take 1.5 tablets (60 mg total) by mouth daily. 135 tablet 3   gabapentin (NEURONTIN) 100 MG capsule Take 1 capsule (100 mg total) by mouth 4 (four) times daily. 120 capsule 11   hydrocortisone (ANUSOL-HC) 2.5 % rectal cream Place 1 application rectally 2 (two) times daily. 30 g 1   isosorbide mononitrate (IMDUR) 60 MG 24 hr tablet Take 60 mg by mouth daily.     magnesium oxide (MAG-OX) 400 MG tablet TAKE ONE TABLET BY MOUTH DAILY 90 tablet 3   metoprolol succinate (TOPROL-XL) 25 MG 24 hr tablet Take 75 mg by mouth daily.     Multiple Vitamins-Minerals (HAIR/SKIN/NAILS) CAPS Take 1 tablet by mouth daily.     Naphazoline HCl (CLEAR EYES OP) Place 1 drop into both eyes daily as needed (itching).     nitroGLYCERIN (NITROSTAT) 0.4 MG SL tablet Place 1 tablet (0.4 mg total) under the tongue every 5 (five) minutes as needed for  chest pain. Reported on 08/04/2015 25 tablet 6   pantoprazole (  PROTONIX) 40 MG tablet TAKE ONE TABLET BY MOUTH TWICE DAILY BEFORE MEALS 60 tablet 5   potassium chloride (KLOR-CON) 10 MEQ tablet Take 1 tablet (10 mEq total) by mouth daily. 180 tablet 1   triamcinolone cream (KENALOG) 0.1 % Apply topically 2 (two) times daily.     zinc sulfate 220 (50 Zn) MG capsule Take 1 capsule (220 mg total) by mouth daily. 30 capsule 3   apixaban (ELIQUIS) 5 MG TABS tablet Take 1 tablet (5 mg total) by mouth 2 (two) times daily. (Patient not taking: Reported on 11/25/2020) 180 tablet 3   No current facility-administered medications on file prior to visit.    Allergies  Allergen Reactions   Cephalosporins Diarrhea and Nausea Only    Lightheaded   Levaquin [Levofloxacin In D5w] Swelling   Macrodantin [Nitrofurantoin Macrocrystal] Swelling   Phenothiazines Anaphylaxis and Hives   Polysorbate Anaphylaxis   Prednisone Shortness Of Breath   Buspirone Itching   Cardura [Doxazosin Mesylate] Itching   Codeine Itching   Acyclovir And Related Itching    Redness of skin   Colcrys [Colchicine] Nausea Only    Upset stomach    Prochlorperazine Other (See Comments)    "Upset stomach"   Ranexa [Ranolazine]     Severe drop in BP   Atorvastatin Hives    Cramping; tolerates Crestor ok   Colestipol Palpitations   Ofloxacin Rash   Other Itching and Rash    "WOOL"= make skin look like it has been burned   Penicillins Other (See Comments)    Causes redness all over. Has patient had a PCN reaction causing immediate rash, facial/tongue/throat swelling, SOB or lightheadedness with hypotension: No Has patient had a PCN reaction causing severe rash involving mucus membranes or skin necrosis: No Has patient had a PCN reaction that required hospitalization No Has patient had a PCN reaction occurring within the last 10 years: No If all of the above answers are "NO", then may proceed with Cephalosporin use.    Pimozide  Hives and Itching    Social History   Substance and Sexual Activity  Alcohol Use Not Currently    Social History   Tobacco Use  Smoking Status Former   Packs/day: 1.00   Years: 64.00   Pack years: 64.00   Types: Cigarettes   Start date: 12/24/1947   Quit date: 11/17/2001   Years since quitting: 19.0  Smokeless Tobacco Never  Tobacco Comments   Quit smoking in 2003    Review of Systems  Constitutional:  Positive for malaise/fatigue.  HENT:  Positive for sinus pain.   Eyes: Negative.   Cardiovascular:  Positive for chest pain and leg swelling.  Gastrointestinal:  Positive for abdominal pain and heartburn.  Genitourinary: Negative.   Musculoskeletal:  Positive for back pain and joint pain.  Skin: Negative.   Neurological:  Positive for dizziness.  Endo/Heme/Allergies:  Bruises/bleeds easily.  Psychiatric/Behavioral: Negative.     Objective   Vitals:   11/25/20 1035  BP: 124/60  Pulse: 89  Resp: 16  Temp: 98.1 F (36.7 C)  SpO2: 93%    Physical Exam Vitals reviewed.  Constitutional:      Appearance: Normal appearance. She is not ill-appearing.     Comments: Wheelchair-bound.  Somewhat unsteady when standing.  HENT:     Head: Normocephalic and atraumatic.  Cardiovascular:     Rate and Rhythm: Normal rate. Rhythm irregular.     Heart sounds: No murmur heard.   No gallop.  Pulmonary:  Effort: Pulmonary effort is normal. No respiratory distress.     Breath sounds: Normal breath sounds. No stridor. No wheezing, rhonchi or rales.  Genitourinary:    Comments: Fair sphincter tone.  Some external hemorrhoidal skin tag noted.  An internal hemorrhoid which was not bleeding was present at the 5 o'clock position.  No blood present. Skin:    General: Skin is warm and dry.  Neurological:     Mental Status: She is alert and oriented to person, place, and time.    Assessment  Bleeding internal hemorrhoid, currently not active.  Has been off Eliquis.  Multiple  comorbidities which make any surgical intervention at increased risk. Plan  At this point, I do not recommend hemorrhoidectomy.  I told him that they can restart the Eliquis and we will follow along to see how pronounced her rectal bleeding becomes.  She should avoid straining when moving her bowels.  This was explained to the patient and her family who is now taking care of her.  We will follow expectantly.

## 2020-11-28 ENCOUNTER — Telehealth: Payer: Self-pay | Admitting: Gastroenterology

## 2020-11-28 NOTE — Telephone Encounter (Signed)
Reviewed OV with General Surgery. Rectal bleeding had stopped by the time of her OV. Dr. Arnoldo Morale did not recommend surgical intervention of hemorrhoids in light of multiple comorbidities. Advised she resume Eliquis and monitor for return of rectal bleeding.   Courtney:  Please let patient know I reviewed OV with General Surgery and see Dr. Arnoldo Morale has recommended against surgical intervention at this time. Please see how she is doing. Any return of rectal bleeding since resuming Eliquis?  If she has return of rectal bleeding, she should use anusol rectal cream as before and can let us know. Call Dr. Arnoldo Morale back if any persistent rectal bleeding.   I see she has an appt with hematology on 8/23. She needs to keep this appt.   Recommend she follow-up in our office in October.   Erline Levine, please arrange follow-up in October.

## 2020-11-29 ENCOUNTER — Telehealth: Payer: Self-pay | Admitting: Internal Medicine

## 2020-11-29 ENCOUNTER — Encounter: Payer: Self-pay | Admitting: Internal Medicine

## 2020-11-29 NOTE — Telephone Encounter (Signed)
Pt called back.  See other phone note.

## 2020-11-29 NOTE — Telephone Encounter (Signed)
Spoke to daughter (listed on [place]).  She was informed of recommendations.  She informed me that pt is doing really well.  No bleeding so far.  Started Eliquis Thursday night.  She also started back on Iron and Colace to help.  She was informed to call us and Dr. Arnoldo Morale if any persistent rectal bleeding.  She voiced understanding.  She was advised of future appt in Jan and is aware that we will call if any cancellations.

## 2020-11-29 NOTE — Telephone Encounter (Signed)
Noted.  I am glad to hear she is doing well!

## 2020-11-29 NOTE — Telephone Encounter (Signed)
Pt's daughter, Otila Kluver, was returning a call. [phone]

## 2020-11-29 NOTE — Telephone Encounter (Signed)
Lmom for pt or daughter to call me back.

## 2020-11-29 NOTE — Telephone Encounter (Signed)
Lmom for pt to call me back. 

## 2020-11-30 ENCOUNTER — Telehealth: Payer: Self-pay | Admitting: *Deleted

## 2020-11-30 NOTE — Telephone Encounter (Signed)
Laurine Blazer, LPN  1/58/6825  7:49 PM EDT Back to Top    Daughter Otila Kluver) notified, copy to pcp.    Arnoldo Lenis, MD  11/29/2020 12:12 PM EDT     Labs overall look ok, still suggest some fluid overload, continue lasix   Zandra Abts MD

## 2020-12-07 ENCOUNTER — Other Ambulatory Visit: Payer: Self-pay

## 2020-12-07 ENCOUNTER — Inpatient Hospital Stay (HOSPITAL_COMMUNITY): Payer: PPO | Attending: Hematology and Oncology | Admitting: Hematology and Oncology

## 2020-12-07 ENCOUNTER — Inpatient Hospital Stay (HOSPITAL_COMMUNITY): Payer: PPO

## 2020-12-07 VITALS — BP 135/87 | HR 68 | Temp 96.8°F | Resp 19 | Wt 166.6 lb

## 2020-12-07 DIAGNOSIS — K649 Unspecified hemorrhoids: Secondary | ICD-10-CM | POA: Diagnosis not present

## 2020-12-07 DIAGNOSIS — D500 Iron deficiency anemia secondary to blood loss (chronic): Secondary | ICD-10-CM

## 2020-12-07 DIAGNOSIS — Z87891 Personal history of nicotine dependence: Secondary | ICD-10-CM | POA: Diagnosis not present

## 2020-12-07 DIAGNOSIS — I10 Essential (primary) hypertension: Secondary | ICD-10-CM | POA: Insufficient documentation

## 2020-12-07 DIAGNOSIS — D509 Iron deficiency anemia, unspecified: Secondary | ICD-10-CM | POA: Insufficient documentation

## 2020-12-07 DIAGNOSIS — K922 Gastrointestinal hemorrhage, unspecified: Secondary | ICD-10-CM | POA: Diagnosis not present

## 2020-12-07 DIAGNOSIS — Z90710 Acquired absence of both cervix and uterus: Secondary | ICD-10-CM | POA: Insufficient documentation

## 2020-12-07 LAB — CBC WITH DIFFERENTIAL/PLATELET
Abs Immature Granulocytes: 0 10*3/uL (ref 0.00–0.07)
Band Neutrophils: 1 %
Basophils Absolute: 0.1 10*3/uL (ref 0.0–0.1)
Basophils Relative: 1 %
Blasts: 0 %
Eosinophils Absolute: 0.3 10*3/uL (ref 0.0–0.5)
Eosinophils Relative: 3 %
HCT: 34.4 % — ABNORMAL LOW (ref 36.0–46.0)
Hemoglobin: 10.4 g/dL — ABNORMAL LOW (ref 12.0–15.0)
Lymphocytes Relative: 23 %
Lymphs Abs: 2.2 10*3/uL (ref 0.7–4.0)
MCH: 23.6 pg — ABNORMAL LOW (ref 26.0–34.0)
MCHC: 30.2 g/dL (ref 30.0–36.0)
MCV: 78.2 fL — ABNORMAL LOW (ref 80.0–100.0)
Metamyelocytes Relative: 0 %
Monocytes Absolute: 0.7 10*3/uL (ref 0.1–1.0)
Monocytes Relative: 7 %
Myelocytes: 0 %
Neutro Abs: 6.2 10*3/uL (ref 1.7–7.7)
Neutrophils Relative %: 65 %
Other: 0 %
Platelets: 590 10*3/uL — ABNORMAL HIGH (ref 150–400)
Promyelocytes Relative: 0 %
RBC: 4.4 MIL/uL (ref 3.87–5.11)
RDW: 21.6 % — ABNORMAL HIGH (ref 11.5–15.5)
WBC: 9.5 10*3/uL (ref 4.0–10.5)
nRBC: 0 % (ref 0.0–0.2)
nRBC: 0 /100 WBC

## 2020-12-07 LAB — RETIC PANEL
Immature Retic Fract: 22.4 % — ABNORMAL HIGH (ref 2.3–15.9)
RBC.: 4.44 MIL/uL (ref 3.87–5.11)
Retic Count, Absolute: 67 10*3/uL (ref 19.0–186.0)
Retic Ct Pct: 1.5 % (ref 0.4–3.1)
Reticulocyte Hemoglobin: 23.5 pg — ABNORMAL LOW (ref >27.9)

## 2020-12-07 LAB — IRON AND TIBC
Iron: 21 ug/dL — ABNORMAL LOW (ref 28–170)
Saturation Ratios: 4 % — ABNORMAL LOW (ref 10.4–31.8)
TIBC: 526 ug/dL — ABNORMAL HIGH (ref 250–450)
UIBC: 505 ug/dL

## 2020-12-07 LAB — COMPREHENSIVE METABOLIC PANEL
ALT: 17 U/L (ref 0–44)
AST: 26 U/L (ref 15–41)
Albumin: 3.6 g/dL (ref 3.5–5.0)
Alkaline Phosphatase: 99 U/L (ref 38–126)
Anion gap: 11 (ref 5–15)
BUN: 24 mg/dL — ABNORMAL HIGH (ref 8–23)
CO2: 28 mmol/L (ref 22–32)
Calcium: 9.5 mg/dL (ref 8.9–10.3)
Chloride: 96 mmol/L — ABNORMAL LOW (ref 98–111)
Creatinine, Ser: 1.27 mg/dL — ABNORMAL HIGH (ref 0.44–1.00)
GFR, Estimated: 41 mL/min — ABNORMAL LOW (ref >60)
Glucose, Bld: 155 mg/dL — ABNORMAL HIGH (ref 70–99)
Potassium: 3.7 mmol/L (ref 3.5–5.1)
Sodium: 135 mmol/L (ref 135–145)
Total Bilirubin: 0.5 mg/dL (ref 0.3–1.2)
Total Protein: 7.6 g/dL (ref 6.5–8.1)

## 2020-12-07 LAB — FERRITIN: Ferritin: 19 ng/mL (ref 11–307)

## 2020-12-07 NOTE — Progress Notes (Signed)
[place] [phone]   Fax:([phone]) [place] NOTE  [name] Care Team: Rosine Door as PCP - General (Physician Assistant) Evans Lance, MD as PCP - Electrophysiology (Cardiology) Harl Bowie Alphonse Guild, MD as PCP - Cardiology (Cardiology) Gala Romney Cristopher Estimable, MD (Gastroenterology) Kristeen Miss, MD as Consulting Physician (Neurosurgery)  Hematological/Oncological History # Iron Deficiency Anemia 2/2 to GI Bleeding  CHIEF COMPLAINTS/PURPOSE OF CONSULTATION:  "Iron Deficiency Anemia "  HISTORY OF PRESENTING ILLNESS:  [name] 87 y.o. female with medical history significant for CKD, CHF, CAD, HTN, GERD, HLD, OSA, and hemorrhoids who presents for evaluation of iron deficiency anemia.   On review of Tonya previous records [name] has a longstanding history of anemia.  She last had normal hemoglobins back in 2020.  She had a hemoglobin of 12.3 on 03/10/2019.  She subsequently dropped to 9.2 on 11/08/2019.  Most recently she had a substantial drop in hemoglobin down to 8.3 on 11/16/2020.  At that time she was noted to have an MCV of 79.6, platelet count of 426, and a white blood cell count of 7.6.  She last had an iron panel collected on 08/04/2020 which showed iron of 18, total iron binding capacity of 602, ferritin 93, and iron saturation of 3%.  She has been taking p.o. iron therapy.  She is also been evaluated by gastroenterology and surgery for bleeding hemorrhoids which is considered to be Tonya source of her iron deficiency anemia.  On exam today [name] is accompanied by her daughter.  She reports that she has quite heavy bleeding from her hemorrhoids and has been a problem for years.  She notes it is worsened over Tonya last 2 months.  She notes that it looks as though "someone stabbed her and she is bleeding.  Her daughter reports heavy volume of blood loss from her hemorrhoids.  She notes that she is been evaluated by surgery and  GI and notes that they are not able to offer her procedure at this time to help with Tonya hemorrhoids.  She notes that she does not have a particular good appetite and does not eat well.  She is also trying to become constipated because will worsen Tonya hemorrhoids and therefore takes stool softeners.  She does have bad spells with diarrhea which can also aggravate Tonya hemorrhoid.  She notes that she takes iron pills but this can flareup Tonya constipation.  On further discussion she endorses having weakness, lightheadedness, dizzy spells.  She also has ice cravings.  She endorses having shortness of breath.  She currently denies any fevers, chills, sweats, nausea, vomiting or diarrhea.  A full 10 point ROS is listed below.  MEDICAL HISTORY:  Past Medical History:  Diagnosis Date   Anxiety    Arthritis    Atrial fibrillation (HCC)    Bursitis    Left shoulder   Cataract    CHF (congestive heart failure) (HCC)    CKD (chronic kidney disease)    stage 3-4   COPD (chronic obstructive pulmonary disease) (HCC)    Coronary atherosclerosis of native coronary artery    a. s/p DES to LCx in 04/2013 b. cath in 11/2015 showing patent stent with 20% prox-LAD and 80% ostial RCA stenosis for which medical management was recommended due to small artery size   Depression    Diastolic heart failure (HCC)    EF 55-60%   Dysphagia, unspecified(787.20)    Dyspnea    Dysrhythmia  Essential hypertension    GERD (gastroesophageal reflux disease)    Hx Schatzki's ring, multiple EGD/ED last 01/06/2004   Gout    Headache    History of anemia    Hyperlipidemia    Internal hemorrhoids without mention of complication    MI (myocardial infarction) ([place]) 2006   Microscopic colitis 2003   Panic disorder without agoraphobia    Paresthesia    Pneumonia 12/2011   PVD (peripheral vascular disease) (HCC)    S/P colonoscopy 09/27/2001   internal hemorrhoids, desc colon inflam polyp, SB BX-chronic duodenitis, colitis    Sleep apnea    Thyroid disease     SURGICAL HISTORY: Past Surgical History:  Procedure Laterality Date   ABDOMINAL HYSTERECTOMY     ABDOMINAL HYSTERECTOMY     AGILE CAPSULE N/A 08/30/2020   Procedure: AGILE CAPSULE;  Surgeon: Daneil Dolin, MD;  [facility];  Service: Endoscopy;  Laterality: N/A;  7:30am   ANTERIOR AND POSTERIOR REPAIR     with resection of vagina   ANTERIOR LAT LUMBAR FUSION N/A 08/01/2016   Procedure: Lumbar Two-Lumbar Five Transpsoas lateral interbody fusion with Lumbar Two-Three lateral plate fixation;  Surgeon: Kevan Ny Ditty, MD;  Location: [facility];  Service: Neurosurgery;  Laterality: N/A;  L2-5 Transpsoas lateral interbody fusion with L2-3 lateral plate fixation   APPENDECTOMY     BACK SURGERY     BIOPSY  07/05/2015   Procedure: BIOPSY;  Surgeon: Daneil Dolin, MD;  [facility];  Service: Endoscopy;;  gastric polyp biopsy, ascending colon biopsy   BIOPSY  02/16/2020   Procedure: BIOPSY;  Surgeon: Daneil Dolin, MD;  [facility];  Service: Endoscopy;;   BIOPSY  06/28/2020   Procedure: BIOPSY;  Surgeon: Daneil Dolin, MD;  [facility];  Service: Endoscopy;;   BLADDER SUSPENSION  11/09/2011   Procedure: TRANSVAGINAL TAPE (TVT) PROCEDURE;  Surgeon: Marissa Nestle, MD;  Location: [facility];  Service: Urology;  Laterality: N/A;   bladder tack  06/2010   BREAST LUMPECTOMY  1998   left, benign   CARDIAC CATHETERIZATION     CARDIAC CATHETERIZATION     CARDIAC CATHETERIZATION N/A 12/16/2015   Procedure: Left Heart Cath and Coronary Angiography;  Surgeon: Troy Sine, MD;  Location: [facility];  Service: Cardiovascular;  Laterality: N/A;   CARDIOVERSION N/A 10/04/2017   Procedure: CARDIOVERSION;  Surgeon: Herminio Commons, MD;  [facility];  Service: Cardiovascular;  Laterality: N/A;   CARDIOVERSION N/A 01/30/2018   Procedure: CARDIOVERSION;  Surgeon: Herminio Commons, MD;  [facility];  Service: Cardiovascular;  Laterality: N/A;   CARDIOVERSION N/A 11/10/2019   Procedure: CARDIOVERSION;  Surgeon: Josue Hector, MD;  Location: [facility] END[facility];  Service: Cardiovascular;  Laterality: N/A;   [place]   left   cataract surgery     CHOLECYSTECTOMY  1998   Cholecystectomy     COLONOSCOPY  03/16/2011   multiple hyperplastic colon polyps, sigmoid diverticulosis, melanosis coli   COLONOSCOPY WITH PROPOFOL N/A 07/05/2015   RMR:one 5 mm polyp in descending colon   COLONOSCOPY WITH PROPOFOL N/A 06/28/2020   Procedure: COLONOSCOPY WITH PROPOFOL;  Surgeon: Daneil Dolin, MD;  [facility];  Service: Endoscopy;  Laterality: N/A;  am appt   CORONARY ANGIOGRAPHY N/A 05/16/2018   Procedure: CORONARY ANGIOGRAPHY (CATH LAB);  Surgeon: Belva Crome, MD;  Location: [facility];  Service: Cardiovascular;  Laterality: N/A;   CORONARY ANGIOPLASTY WITH STENT PLACEMENT  2015   ESOPHAGEAL DILATION N/A 07/05/2015   Procedure: ESOPHAGEAL DILATION;  Surgeon: Daneil Dolin, MD;  [facility];  Service: Endoscopy;  Laterality: N/A;   ESOPHAGOGASTRODUODENOSCOPY (EGD) WITH PROPOFOL N/A 07/05/2015   MVE:HMCNOB   ESOPHAGOGASTRODUODENOSCOPY (EGD) WITH PROPOFOL N/A 02/16/2020   Procedure: ESOPHAGOGASTRODUODENOSCOPY (EGD) WITH PROPOFOL;  Surgeon: Daneil Dolin, MD;  Normal examined esophagus s/p dilation, erythematous mucosa in Tonya stomach s/p biopsy, normal examined duodenum. Pathology with mild gastropathy, negative for H. Pylori.    GIVENS CAPSULE STUDY N/A 11/10/2020   Procedure: GIVENS CAPSULE STUDY;  Surgeon: Daneil Dolin, MD;  [facility];  Service: Endoscopy;  Laterality: N/A;  7:30am   JOINT REPLACEMENT Right 2007   right knee   left hand surgery     LEFT HEART CATHETERIZATION WITH CORONARY ANGIOGRAM N/A 05/14/2013   Procedure: LEFT HEART CATHETERIZATION WITH CORONARY ANGIOGRAM;  Surgeon: Blane Ohara, MD;  Location: [facility] CATH LAB;   Service: Cardiovascular;  Laterality: N/A;   left rotator cuff surgery     LUMBAR LAMINECTOMY/DECOMPRESSION MICRODISCECTOMY N/A 10/11/2012   Procedure: LUMBAR LAMINECTOMY/DECOMPRESSION MICRODISCECTOMY 2 LEVELS;  Surgeon: Floyce Stakes, MD;  Location: [facility];  Service: Neurosurgery;  Laterality: N/A;  L3-4 L4-5 Laminectomy   LUMBAR WOUND DEBRIDEMENT N/A 09/27/2015   Procedure: Exploration of Lumbar Wound w/ Repair CSF Leak/Lumbar Drain Placement;  Surgeon: Leeroy Cha, MD;  Location: [facility] NEURO ORS;  Service: Neurosurgery;  Laterality: N/A;   MALONEY DILATION  03/16/2011   Gastritis. No H.pylori on bx. 85F maloney dilation with disruption of  occult cevical esophageal web   MALONEY DILATION N/A 02/16/2020   Procedure: MALONEY DILATION;  Surgeon: Daneil Dolin, MD;  [facility];  Service: Endoscopy;  Laterality: N/A;   NASAL SINUS SURGERY     right knee replacement  2007   right leg benign tumor     SHOULDER SURGERY Left    TEE WITHOUT CARDIOVERSION N/A 10/04/2017   Procedure: TRANSESOPHAGEAL ECHOCARDIOGRAM (TEE) WITH PROPOFOL;  Surgeon: Herminio Commons, MD;  [facility];  Service: Cardiovascular;  Laterality: N/A;   TEE WITHOUT CARDIOVERSION N/A 11/10/2019   Procedure: TRANSESOPHAGEAL ECHOCARDIOGRAM (TEE);  Surgeon: Josue Hector, MD;  Location: [facility] ENDOSCOPY;  Service: Cardiovascular;  Laterality: N/A;   TONSILLECTOMY     unspecified area, hysterectomy  1972   partial    SOCIAL HISTORY: Social History   Socioeconomic History   Marital status: Divorced    Spouse name: Not on file   Number of children: 5   Years of education: Not on file   Highest education level: Not on file  Occupational History   Occupation: retired  Tobacco Use   Smoking status: Former    Packs/day: 1.00    Years: 64.00    Pack years: 64.00    Types: Cigarettes    Start date: 12/24/1947    Quit date: 11/17/2001    Years since quitting: 19.0   Smokeless tobacco: Never   Tobacco  comments:    Quit smoking in 2003  Vaping Use   Vaping Use: Never used  Substance and Sexual Activity   Alcohol use: Not Currently   Drug use: No   Sexual activity: Never  Other Topics Concern   Not on file  Social History Narrative   Divorced.   Sister had colon perforation & died from complications in [place], [place]   Social Determinants of Health  Financial Resource Strain: Not on file  Food Insecurity: Not on file  Transportation Needs: Not on file  Physical Activity: Not on file  Stress: Not on file  Social Connections: Not on file  Intimate Partner Violence: Not on file    FAMILY HISTORY: Family History  Problem Relation Age of Onset   Stroke Mother    Parkinson's disease Father    Coronary artery disease Other        family Hx-sons   Cancer Other    Stroke Other        family Hx   Hypertension Other        family Hx   Diabetes Brother    Heart disease Son        before age 70   Diabetes Son    Stroke Daughter 81   Colon cancer Grandson        diagnosed 53   Inflammatory bowel disease Neg Hx     ALLERGIES:  is allergic to cephalosporins, levaquin [levofloxacin in d5w], macrodantin [nitrofurantoin macrocrystal], phenothiazines, polysorbate, prednisone, buspirone, cardura [doxazosin mesylate], codeine, acyclovir and related, colcrys [colchicine], prochlorperazine, ranexa [ranolazine], atorvastatin, colestipol, ofloxacin, other, penicillins, and pimozide.  MEDICATIONS:  Current Outpatient Medications  Medication Sig Dispense Refill   apixaban (ELIQUIS) 5 MG TABS tablet Take 1 tablet (5 mg total) by mouth 2 (two) times daily. 180 tablet 3   ascorbic acid (VITAMIN C) 500 MG tablet Take 1 tablet (500 mg total) by mouth daily. 30 tablet 2   diltiazem (CARDIZEM CD) 120 MG 24 hr capsule TAKE ONE CAPSULE BY MOUTH DAILY 90 capsule 1   DULoxetine (CYMBALTA) 60 MG capsule Take 60 mg by mouth at bedtime.     ferrous sulfate 325 (65 FE) MG tablet Take 1 tablet (325 mg total)  by mouth daily with breakfast. 30 tablet 3   furosemide (LASIX) 40 MG tablet Take 1.5 tablets (60 mg total) by mouth daily. 135 tablet 3   gabapentin (NEURONTIN) 100 MG capsule Take 1 capsule (100 mg total) by mouth 4 (four) times daily. 120 capsule 11   hydrocortisone (ANUSOL-HC) 2.5 % rectal cream Place 1 application rectally 2 (two) times daily. 30 g 1   isosorbide mononitrate (IMDUR) 60 MG 24 hr tablet Take 60 mg by mouth daily.     magnesium oxide (MAG-OX) 400 MG tablet TAKE ONE TABLET BY MOUTH DAILY 90 tablet 3   metoprolol succinate (TOPROL-XL) 25 MG 24 hr tablet Take 75 mg by mouth daily.     Multiple Vitamins-Minerals (HAIR/SKIN/NAILS) CAPS Take 1 tablet by mouth daily.     Naphazoline HCl (CLEAR EYES OP) Place 1 drop into both eyes daily as needed (itching).     pantoprazole (PROTONIX) 40 MG tablet TAKE ONE TABLET BY MOUTH TWICE DAILY BEFORE MEALS 60 tablet 5   potassium chloride (KLOR-CON) 10 MEQ tablet Take 1 tablet (10 mEq total) by mouth daily. 180 tablet 1   triamcinolone cream (KENALOG) 0.1 % Apply topically 2 (two) times daily.     zinc sulfate 220 (50 Zn) MG capsule Take 1 capsule (220 mg total) by mouth daily. 30 capsule 3   acetaminophen (TYLENOL) 500 MG tablet Take 500 mg by mouth every 6 (six) hours as needed for headache or moderate pain. ([name] not taking: Reported on 12/07/2020)     ALPRAZolam (XANAX) 0.5 MG tablet Take 0.5 mg by mouth daily as needed for anxiety or sleep. ([name] not taking: Reported on 12/07/2020)     fluticasone (CUTIVATE)  0.05 % cream Apply 1 application topically 2 (two) times daily as needed (skin tears). ([name] not taking: Reported on 12/07/2020)     nitroGLYCERIN (NITROSTAT) 0.4 MG SL tablet Place 1 tablet (0.4 mg total) under Tonya tongue every 5 (five) minutes as needed for chest pain. Reported on 08/04/2015 ([name] not taking: Reported on 12/07/2020) 25 tablet 6   No current facility-administered medications for this visit.    REVIEW OF  SYSTEMS:   Constitutional: ( - ) fevers, ( - )  chills , ( - ) night sweats Eyes: ( - ) blurriness of vision, ( - ) double vision, ( - ) watery eyes Ears, nose, mouth, throat, and face: ( - ) mucositis, ( - ) sore throat Respiratory: ( - ) cough, ( - ) dyspnea, ( - ) wheezes Cardiovascular: ( - ) palpitation, ( - ) chest discomfort, ( - ) lower extremity swelling Gastrointestinal:  ( - ) nausea, ( - ) heartburn, ( - ) change in bowel habits Skin: ( - ) abnormal skin rashes Lymphatics: ( - ) new lymphadenopathy, ( - ) easy bruising Neurological: ( - ) numbness, ( - ) tingling, ( - ) new weaknesses Behavioral/Psych: ( - ) mood change, ( - ) new changes  All other systems were reviewed with [name] and are negative.  PHYSICAL EXAMINATION: ECOG PERFORMANCE STATUS: 1 - Symptomatic but completely ambulatory  Vitals:   12/07/20 1458  BP: 135/87  Pulse: 68  Resp: 19  Temp: (!) 96.8 F (36 C)  SpO2: 97%   Filed Weights   12/07/20 1458  Weight: 166 lb 9.6 oz (75.6 kg)    GENERAL: chronically ill appearing elderly Caucasian female in NAD  SKIN: skin color, texture, turgor are normal, no rashes or significant lesions EYES: conjunctiva are pink and non-injected, sclera clear LUNGS: clear to auscultation and percussion with normal breathing effort HEART: regular rate & rhythm and no murmurs and no lower extremity edema PSYCH: alert & oriented x 3, fluent speech NEURO: no focal motor/sensory deficits  LABORATORY DATA:  I have reviewed Tonya data as listed CBC Latest Ref Rng & Units 11/17/2020 11/16/2020 11/04/2020  WBC 4.0 - 10.5 K/uL 8.0 7.6 9.6  Hemoglobin 12.0 - 15.0 g/dL 8.9(L) 8.3(L) 9.3(L)  Hematocrit 36.0 - 46.0 % 31.2(L) 28.1(L) 30.5(L)  Platelets 150 - 400 K/uL 479(H) 426(H) 497(H)    CMP Latest Ref Rng & Units 11/17/2020 11/17/2020 10/01/2020  Glucose 70 - 99 mg/dL 152(H) 189(H) 115(H)  BUN 8 - 23 mg/dL 17 18 33(H)  Creatinine 0.44 - 1.00 mg/dL 1.09(H) 1.13(H) 1.36(H)  Sodium  135 - 145 mmol/L 133(L) 132(L) 134(L)  Potassium 3.5 - 5.1 mmol/L 3.5 3.5 3.7  Chloride 98 - 111 mmol/L 97(L) 95(L) 102  CO2 22 - 32 mmol/L 26 26 25   Calcium 8.9 - 10.3 mg/dL 8.7(L) 8.8(L) 8.1(L)  Total Protein 6.5 - 8.1 g/dL 7.3 - 6.1(L)  Total Bilirubin 0.3 - 1.2 mg/dL 0.7 - 0.2(L)  Alkaline Phos 38 - 126 U/L 123 - 64  AST 15 - 41 U/L 19 - 25  ALT 0 - 44 U/L 17 - 19    RADIOGRAPHIC STUDIES: DG Chest 1 View  Result Date: 11/17/2020 CLINICAL DATA:  Shortness of breath, chest pain, chills and generalized weakness since yesterday morning. EXAM: CHEST  1 VIEW COMPARISON:  Chest x-ray 09/29/2020 and chest CT 05/15/2019 FINDINGS: Tonya heart is mildly enlarged. Stable tortuosity and calcification of Tonya thoracic aorta. Slight vascular prominence, peribronchial thickening  and increased interstitial markings suspicious for pulmonary edema. Bronchitis would be another possibility. No focal infiltrates or pleural effusions. Stable mild eventration of Tonya right hemidiaphragm. No focal lesions or pneumothorax. Tonya bony thorax is intact.  Remote healed rib fractures are noted. IMPRESSION: Findings suggest CHF or bronchitis.  No infiltrates or effusions. Electronically Signed   By: Marijo Sanes M.D.   On: 11/17/2020 14:18   CT HEAD WO CONTRAST (5MM)  Result Date: 11/17/2020 CLINICAL DATA:  New onset confusion starting 11/16/2020. Altered mental status. EXAM: CT HEAD WITHOUT CONTRAST TECHNIQUE: Contiguous axial images were obtained from Tonya base of Tonya skull through Tonya vertex without intravenous contrast. COMPARISON:  CT head 03/24/2020 FINDINGS: Brain: Tonya brainstem, cerebellum, cerebral peduncles, thalami, basal ganglia, basilar cisterns, and ventricular system appear within normal limits. Periventricular white matter and corona radiata hypodensities favor chronic ischemic microvascular white matter disease. No intracranial hemorrhage, mass lesion, or acute CVA. Vascular: There is atherosclerotic calcification  of Tonya cavernous carotid arteries bilaterally. Skull: Unremarkable Sinuses/Orbits: Subtotal opacification of Tonya right maxillary sinus compatible with chronic sinusitis. Chronic right greater than left ethmoid sinusitis. Left mastoid effusion. No middle ear fluid is identified. Other: No supplemental non-categorized findings. IMPRESSION: 1. No acute intracranial findings. 2. Periventricular white matter and corona radiata hypodensities favor chronic ischemic microvascular white matter disease. 3. Chronic right maxillary and right greater than left ethmoid sinusitis. Left mastoid effusion. Electronically Signed   By: Van Clines M.D.   On: 11/17/2020 14:50    ASSESSMENT & PLAN EVANGELA HEFFLER 87 y.o. female with medical history significant for CKD, CHF, CAD, HTN, GERD, HLD, OSA, and hemorrhoids who presents for evaluation of iron deficiency anemia.   After review of Tonya labs, review of Tonya records, and discussion with [name] Tonya patients findings are most consistent with iron deficiency anemia secondary to GI bleeding.  [name] has a longstanding history of hemorrhoids which is Tonya likely cause of her current findings.  She notes that she is being evaluated by gastroenterology and surgery in order to help better manage these.  She is taking p.o. iron therapy but it increases constipation and does not appear to be increasing her hemoglobin or iron levels.  Due to this she was sent to hematology for further evaluation.  At this time we recommend IV iron therapy with IV Feraheme 510 mg q. 7 days x 2 doses in order to help bolster her iron levels.  #Iron Deficiency Anemia 2/2 to GI Bleeding --Today we will recheck CBC, CMP, and iron panel. --Recommend IV Feraheme 510 mg q. 7 days x 2 doses --Once IV iron is started can discontinue p.o. iron therapy. --Defer management of hemorrhoids to gastroenterology and surgery. --Plan have [name] return to clinic approximately 4 to 6 weeks after last dose  of IV iron in order to assure it has effectively raise her hemoglobin.  Orders Placed This Encounter  Procedures   CBC with Differential ([place] Only)    Standing Status:   Future    Number of Occurrences:   1    Standing Expiration Date:   12/07/2021   CMP ([facility] only)    Standing Status:   Future    Number of Occurrences:   1    Standing Expiration Date:   12/07/2021   Ferritin    Standing Status:   Future    Number of Occurrences:   1    Standing Expiration Date:   12/07/2021   Iron and TIBC  Standing Status:   Future    Number of Occurrences:   1    Standing Expiration Date:   12/07/2021   Retic Panel    Standing Status:   Future    Number of Occurrences:   1    Standing Expiration Date:   12/07/2021    All questions were answered. [name] knows to call Tonya clinic with any problems, questions or concerns.  A total of more than 60 minutes were spent on this encounter with face-to-face time and non-face-to-face time, including preparing to see [name], ordering tests and/or medications, counseling [name] and coordination of care as outlined above.   Ledell Peoples, MD Department of Hematology/Oncology [place] at [facility] Phone: [phone] Pager: [phone] Email: [email or web address]  12/07/2020 4:07 PM

## 2020-12-08 ENCOUNTER — Other Ambulatory Visit: Payer: Self-pay | Admitting: Hematology and Oncology

## 2020-12-08 ENCOUNTER — Other Ambulatory Visit: Payer: Self-pay | Admitting: Cardiology

## 2020-12-08 DIAGNOSIS — D500 Iron deficiency anemia secondary to blood loss (chronic): Secondary | ICD-10-CM

## 2020-12-08 DIAGNOSIS — D509 Iron deficiency anemia, unspecified: Secondary | ICD-10-CM | POA: Insufficient documentation

## 2020-12-10 ENCOUNTER — Inpatient Hospital Stay (HOSPITAL_COMMUNITY): Payer: PPO

## 2020-12-10 ENCOUNTER — Other Ambulatory Visit: Payer: Self-pay

## 2020-12-10 VITALS — BP 119/53 | HR 79 | Temp 96.9°F | Resp 18

## 2020-12-10 DIAGNOSIS — D509 Iron deficiency anemia, unspecified: Secondary | ICD-10-CM | POA: Diagnosis not present

## 2020-12-10 DIAGNOSIS — D500 Iron deficiency anemia secondary to blood loss (chronic): Secondary | ICD-10-CM

## 2020-12-10 MED ORDER — SODIUM CHLORIDE 0.9 % IV SOLN
510.0000 mg | Freq: Once | INTRAVENOUS | Status: AC
  Administered 2020-12-10: 510 mg via INTRAVENOUS
  Filled 2020-12-10: qty 510

## 2020-12-10 MED ORDER — FAMOTIDINE 20 MG PO TABS
20.0000 mg | ORAL_TABLET | Freq: Once | ORAL | Status: AC
  Administered 2020-12-10: 20 mg via ORAL
  Filled 2020-12-10: qty 1

## 2020-12-10 MED ORDER — SODIUM CHLORIDE 0.9 % IV SOLN: Freq: Once | INTRAVENOUS | Status: AC

## 2020-12-10 MED ORDER — ACETAMINOPHEN 325 MG PO TABS
650.0000 mg | ORAL_TABLET | Freq: Once | ORAL | Status: AC
  Administered 2020-12-10: 650 mg via ORAL
  Filled 2020-12-10: qty 2

## 2020-12-10 MED ORDER — LORATADINE 10 MG PO TABS
10.0000 mg | ORAL_TABLET | Freq: Once | ORAL | Status: AC
  Administered 2020-12-10: 10 mg via ORAL
  Filled 2020-12-10: qty 1

## 2020-12-10 NOTE — Patient Instructions (Signed)
[facility]  Discharge Instructions: °Thank you for choosing [facility] to provide your oncology and hematology care.  °If you have a lab appointment with the Cancer Center, please come in thru the Main Entrance and check in at the main information desk. ° °Wear comfortable clothing and clothing appropriate for easy access to any Portacath or PICC line.  ° °We strive to give you quality time with your provider. You may need to reschedule your appointment if you arrive late (15 or more minutes).  Arriving late affects you and other patients whose appointments are after yours.  Also, if you miss three or more appointments without notifying the office, you may be dismissed from the clinic at the provider’s discretion.    °  °For prescription refill requests, have your pharmacy contact our office and allow 72 hours for refills to be completed.   ° °Today you received the following chemotherapy and/or immunotherapy agents Feraheme    °  °To help prevent nausea and vomiting after your treatment, we encourage you to take your nausea medication as directed. ° °BELOW ARE SYMPTOMS THAT SHOULD BE REPORTED IMMEDIATELY: °*FEVER GREATER THAN 100.4 F (38 °C) OR HIGHER °*CHILLS OR SWEATING °*NAUSEA AND VOMITING THAT IS NOT CONTROLLED WITH YOUR NAUSEA MEDICATION °*UNUSUAL SHORTNESS OF BREATH °*UNUSUAL BRUISING OR BLEEDING °*URINARY PROBLEMS (pain or burning when urinating, or frequent urination) °*BOWEL PROBLEMS (unusual diarrhea, constipation, pain near the anus) °TENDERNESS IN MOUTH AND THROAT WITH OR WITHOUT PRESENCE OF ULCERS (sore throat, sores in mouth, or a toothache) °UNUSUAL RASH, SWELLING OR PAIN  °UNUSUAL VAGINAL DISCHARGE OR ITCHING  ° °Items with * indicate a potential emergency and should be followed up as soon as possible or go to the Emergency Department if any problems should occur. ° °Please show the CHEMOTHERAPY ALERT CARD or IMMUNOTHERAPY ALERT CARD at check-in to the Emergency  Department and triage nurse. ° °Should you have questions after your visit or need to cancel or reschedule your appointment, please contact [facility] [phone]  and follow the prompts.  Office hours are 8:00 a.m. to 4:30 p.m. Monday - Friday. Please note that voicemails left after 4:00 p.m. may not be returned until the following business day.  We are closed weekends and major holidays. You have access to a nurse at all times for urgent questions. Please call the main number to the clinic [phone] and follow the prompts. ° °For any non-urgent questions, you may also contact your provider using MyChart. We now offer e-Visits for anyone 18 and older to request care online for non-urgent symptoms. For details visit [email or web address]. °  °Also download the MyChart app! Go to the app store, search "MyChart", open the app, select [facility], and log in with your MyChart username and password. ° °Due to Covid, a mask is required upon entering the hospital/clinic. If you do not have a mask, one will be given to you upon arrival. For doctor visits, patients may have 1 support person aged 18 or older with them. For treatment visits, patients cannot have anyone with them due to current Covid guidelines and our immunocompromised population.  °

## 2020-12-10 NOTE — Progress Notes (Signed)
Patient presents today for Feraheme infusion per providers order.  Vital signs WNL.  Patient has no new complaints at this time.    Peripheral IV started and blood return noted pre and post infusion.  Feraheme infusion  given today per MD orders.  Stable during infusion without adverse affects.  Vital signs stable.  No complaints at this time.  Discharge from clinic ambulatory in stable condition.  Alert and oriented X 3.  Follow up with [facility] as scheduled.  

## 2020-12-14 ENCOUNTER — Ambulatory Visit: Payer: PPO | Admitting: Neurology

## 2020-12-14 ENCOUNTER — Encounter: Payer: Self-pay | Admitting: Neurology

## 2020-12-14 ENCOUNTER — Other Ambulatory Visit: Payer: Self-pay

## 2020-12-14 VITALS — BP 124/7 | HR 73 | Ht 61.0 in | Wt 161.0 lb

## 2020-12-14 DIAGNOSIS — G629 Polyneuropathy, unspecified: Secondary | ICD-10-CM | POA: Diagnosis not present

## 2020-12-14 DIAGNOSIS — M5416 Radiculopathy, lumbar region: Secondary | ICD-10-CM | POA: Diagnosis not present

## 2020-12-14 DIAGNOSIS — M961 Postlaminectomy syndrome, not elsewhere classified: Secondary | ICD-10-CM

## 2020-12-14 DIAGNOSIS — R200 Anesthesia of skin: Secondary | ICD-10-CM

## 2020-12-14 MED ORDER — TRAMADOL HCL 50 MG PO TABS: 50.0000 mg | ORAL_TABLET | Freq: Four times a day (QID) | ORAL | 3 refills | Status: AC | PRN

## 2020-12-14 NOTE — Progress Notes (Signed)
[facility]  PATIENT: [name] DOB: 1932-08-25  REFERRING DOCTOR OR PCP:   Clemmie Krill PA-C SOURCE: Patient, notes from primary care, imaging reports, MRI and CT images personally reviewed  _________________________________   HISTORICAL  CHIEF COMPLAINT:  Chief Complaint  Patient presents with   New Patient (Initial Visit)    Pt with daughter, rm 1. She has been struggling with back pain that runs into her right leg. BLE numb and has no feeling. MRI of back and lumbar was completed.     HISTORY OF PRESENT ILLNESS:  I had the pleasure of seeing your patient, [name], at [facility] for neurologic consultation regarding her back and leg pain.  She is an 87 year old woman who started to note numbness in both feet since 2020.    Sometimes the feet turn purple.    Generally, the feet are not hurting much.  However, she has had very painful episodes in the lower back and into her groin and proximal leg.  When pain is more intense sometimes it feels like the entire leg is involved.  She is on gabapentin and tramadol for her pain.   CHF worsened when the gabapentin dose was increased to higher dose.  She had cognitive changes when tramadol and gabapentin were taken at the same time..  She is allergic to steroids so has not been able to do epidural steroids.     She has had 3 operations on her back, 2015, 2017 and 2018 all at [facility].    She reports she spinal leak in 2017 requiring her to be supine x 10 days.      I personally reviewed the MRI of the lumbar spine from 11/23/2020 and concur with the interpretation.  The most significant findings are a right disc herniation at L1-L2 that could affect the right L2 nerve root.  At L2-L3, there is significant retrolisthesis associated with biforaminal narrowing that could affect the L2 nerve roots.  There has been PLIF from L3-L5.  I compared it to the CT myelogram from 04/11/2016.   It does appear as though degenerative changes at L1-L2 and L2-L3 have progressed since that time.    I also compared to the presurgical MRI from 09/24/2012.  At that time, there was no retrolisthesis at L1-L2 or L2-L3 and the disc at L1-L2 was a protrusion instead of herniation.   Imaging report: MRI of the lumbar spine 11/23/2020 shows: 1. At L2-L3, moderate to severe bilateral foraminal stenosis.  2. At L1-L2, moderate right subarticular recess and right foraminal  stenosis with potential for impingement of descending right L2 nerve  roots. Mild canal and left foraminal stenosis.  3. Approximately 1 cm of retrolisthesis of L2 on L3 and 3 mm of  retrolisthesis of L1 on L2 , increased from the prior. Given this  change in alignment, recommend CT of the lumbar spine to further  evaluate for hardware complication at E5-U3 where there is right  lateral fixation.  4. L3-L5 PLIF with patent canal and foramina.   Suspect higher  REVIEW OF SYSTEMS: Constitutional: No fevers, chills, sweats, or change in appetite Eyes: No visual changes, double vision, eye pain Ear, nose and throat: No hearing loss, ear pain, nasal congestion, sore throat Cardiovascular: No chest pain, palpitations Respiratory:  No shortness of breath at rest or with exertion.   No wheezes GastrointestinaI: No nausea, vomiting, diarrhea, abdominal pain, fecal incontinence Genitourinary:  No dysuria, urinary retention or frequency.  No nocturia. Musculoskeletal:  No neck pain, back pain Integumentary: No rash, pruritus, skin lesions Neurological: as above Psychiatric: No depression at this time.  No anxiety Endocrine: No palpitations, diaphoresis, change in appetite, change in weigh or increased thirst Hematologic/Lymphatic:  No anemia, purpura, petechiae. Allergic/Immunologic: No itchy/runny eyes, nasal congestion, recent allergic reactions, rashes  ALLERGIES: Allergies  Allergen Reactions   Cephalosporins Diarrhea and  Nausea Only    Lightheaded   Levaquin [Levofloxacin In D5w] Swelling   Macrodantin [Nitrofurantoin Macrocrystal] Swelling   Phenothiazines Anaphylaxis and Hives   Polysorbate Anaphylaxis   Prednisone Shortness Of Breath   Buspirone Itching   Cardura [Doxazosin Mesylate] Itching   Codeine Itching   Acyclovir And Related Itching    Redness of skin   Colcrys [Colchicine] Nausea Only    Upset stomach    Prochlorperazine Other (See Comments)    "Upset stomach"   Ranexa [Ranolazine]     Severe drop in BP   Atorvastatin Hives    Cramping; tolerates Crestor ok   Colestipol Palpitations   Ofloxacin Rash   Other Itching and Rash    "WOOL"= make skin look like it has been burned   Penicillins Other (See Comments)    Causes redness all over. Has patient had a PCN reaction causing immediate rash, facial/tongue/throat swelling, SOB or lightheadedness with hypotension: No Has patient had a PCN reaction causing severe rash involving mucus membranes or skin necrosis: No Has patient had a PCN reaction that required hospitalization No Has patient had a PCN reaction occurring within the last 10 years: No If all of the above answers are "NO", then may proceed with Cephalosporin use.    Pimozide Hives and Itching    HOME MEDICATIONS:  Current Outpatient Medications:    acetaminophen (TYLENOL) 500 MG tablet, Take 500 mg by mouth every 6 (six) hours as needed for headache or moderate pain., Disp: , Rfl:    ALPRAZolam (XANAX) 0.5 MG tablet, Take 0.5 mg by mouth daily as needed for anxiety or sleep., Disp: , Rfl:    apixaban (ELIQUIS) 5 MG TABS tablet, Take 1 tablet (5 mg total) by mouth 2 (two) times daily., Disp: 180 tablet, Rfl: 3   ascorbic acid (VITAMIN C) 500 MG tablet, Take 1 tablet (500 mg total) by mouth daily., Disp: 30 tablet, Rfl: 2   diltiazem (CARDIZEM CD) 120 MG 24 hr capsule, TAKE ONE CAPSULE BY MOUTH DAILY, Disp: 90 capsule, Rfl: 1   DULoxetine (CYMBALTA) 60 MG capsule, Take 60 mg  by mouth at bedtime., Disp: , Rfl:    ferrous sulfate 325 (65 FE) MG tablet, Take 1 tablet (325 mg total) by mouth daily with breakfast., Disp: 30 tablet, Rfl: 3   fluticasone (CUTIVATE) 0.05 % cream, Apply 1 application topically 2 (two) times daily as needed (skin tears)., Disp: , Rfl:    furosemide (LASIX) 40 MG tablet, Take 1.5 tablets (60 mg total) by mouth daily., Disp: 135 tablet, Rfl: 3   gabapentin (NEURONTIN) 100 MG capsule, Take 1 capsule (100 mg total) by mouth 4 (four) times daily., Disp: 120 capsule, Rfl: 11   hydrocortisone (ANUSOL-HC) 2.5 % rectal cream, Place 1 application rectally 2 (two) times daily., Disp: 30 g, Rfl: 1   isosorbide mononitrate (IMDUR) 60 MG 24 hr tablet, Take 60 mg by mouth daily., Disp: , Rfl:    magnesium oxide (MAG-OX) 400 MG tablet, TAKE ONE TABLET BY MOUTH DAILY, Disp: 90 tablet, Rfl: 3   metoprolol succinate (TOPROL-XL) 25 MG 24  hr tablet, Take 75 mg by mouth daily., Disp: , Rfl:    Multiple Vitamins-Minerals (HAIR/SKIN/NAILS) CAPS, Take 1 tablet by mouth daily., Disp: , Rfl:    Naphazoline HCl (CLEAR EYES OP), Place 1 drop into both eyes daily as needed (itching)., Disp: , Rfl:    nitroGLYCERIN (NITROSTAT) 0.4 MG SL tablet, DISSOLVE ONE TABLET UNDER TONGUE EVERY 5 MINUTES UP TO 3 DOSES AS NEEDED FOR CHEST PAIN., Disp: 25 tablet, Rfl: 6   pantoprazole (PROTONIX) 40 MG tablet, TAKE ONE TABLET BY MOUTH TWICE DAILY BEFORE MEALS, Disp: 60 tablet, Rfl: 5   potassium chloride (KLOR-CON) 10 MEQ tablet, Take 1 tablet (10 mEq total) by mouth daily., Disp: 180 tablet, Rfl: 1   triamcinolone cream (KENALOG) 0.1 %, Apply topically 2 (two) times daily., Disp: , Rfl:    zinc sulfate 220 (50 Zn) MG capsule, Take 1 capsule (220 mg total) by mouth daily., Disp: 30 capsule, Rfl: 3  PAST MEDICAL HISTORY: Past Medical History:  Diagnosis Date   Anxiety    Arthritis    Atrial fibrillation (HCC)    Bursitis    Left shoulder   Cataract    CHF (congestive heart failure)  (HCC)    CKD (chronic kidney disease)    stage 3-4   COPD (chronic obstructive pulmonary disease) (HCC)    Coronary atherosclerosis of native coronary artery    a. s/p DES to LCx in 04/2013 b. cath in 11/2015 showing patent stent with 20% prox-LAD and 80% ostial RCA stenosis for which medical management was recommended due to small artery size   Depression    Diastolic heart failure (HCC)    EF 55-60%   Dysphagia, unspecified(787.20)    Dyspnea    Dysrhythmia    Essential hypertension    GERD (gastroesophageal reflux disease)    Hx Schatzki's ring, multiple EGD/ED last 01/06/2004   Gout    Headache    History of anemia    Hyperlipidemia    Internal hemorrhoids without mention of complication    MI (myocardial infarction) ([place]) 2006   Microscopic colitis 2003   Panic disorder without agoraphobia    Paresthesia    Pneumonia 12/2011   PVD (peripheral vascular disease) (HCC)    S/P colonoscopy 09/27/2001   internal hemorrhoids, desc colon inflam polyp, SB BX-chronic duodenitis, colitis   Sleep apnea    Thyroid disease     PAST SURGICAL HISTORY: Past Surgical History:  Procedure Laterality Date   ABDOMINAL HYSTERECTOMY     ABDOMINAL HYSTERECTOMY     AGILE CAPSULE N/A 08/30/2020   Procedure: AGILE CAPSULE;  Surgeon: Daneil Dolin, MD;  [facility];  Service: Endoscopy;  Laterality: N/A;  7:30am   ANTERIOR AND POSTERIOR REPAIR     with resection of vagina   ANTERIOR LAT LUMBAR FUSION N/A 08/01/2016   Procedure: Lumbar Two-Lumbar Five Transpsoas lateral interbody fusion with Lumbar Two-Three lateral plate fixation;  Surgeon: Kevan Ny Ditty, MD;  Location: [facility];  Service: Neurosurgery;  Laterality: N/A;  L2-5 Transpsoas lateral interbody fusion with L2-3 lateral plate fixation   APPENDECTOMY     BACK SURGERY     BIOPSY  07/05/2015   Procedure: BIOPSY;  Surgeon: Daneil Dolin, MD;  [facility];  Service: Endoscopy;;  gastric polyp biopsy, ascending  colon biopsy   BIOPSY  02/16/2020   Procedure: BIOPSY;  Surgeon: Daneil Dolin, MD;  [facility];  Service: Endoscopy;;   BIOPSY  06/28/2020  Procedure: BIOPSY;  Surgeon: Daneil Dolin, MD;  [facility];  Service: Endoscopy;;   BLADDER SUSPENSION  11/09/2011   Procedure: TRANSVAGINAL TAPE (TVT) PROCEDURE;  Surgeon: Marissa Nestle, MD;  [facility]: [facility];  Service: Urology;  Laterality: N/A;   bladder tack  06/2010   BREAST LUMPECTOMY  1998   left, benign   CARDIAC CATHETERIZATION     CARDIAC CATHETERIZATION     CARDIAC CATHETERIZATION N/A 12/16/2015   Procedure: Left Heart Cath and Coronary Angiography;  Surgeon: Troy Sine, MD;  Location: [facility];  Service: Cardiovascular;  Laterality: N/A;   CARDIOVERSION N/A 10/04/2017   Procedure: CARDIOVERSION;  Surgeon: Herminio Commons, MD;  [facility]: [facility];  Service: Cardiovascular;  Laterality: N/A;   CARDIOVERSION N/A 01/30/2018   Procedure: CARDIOVERSION;  Surgeon: Herminio Commons, MD;  [facility];  Service: Cardiovascular;  Laterality: N/A;   CARDIOVERSION N/A 11/10/2019   Procedure: CARDIOVERSION;  Surgeon: Josue Hector, MD;  Location: [facility];  Service: Cardiovascular;  Laterality: N/A;   [place]   left   cataract surgery     CHOLECYSTECTOMY  1998   Cholecystectomy     COLONOSCOPY  03/16/2011   multiple hyperplastic colon polyps, sigmoid diverticulosis, melanosis coli   COLONOSCOPY WITH PROPOFOL N/A 07/05/2015   RMR:one 5 mm polyp in descending colon   COLONOSCOPY WITH PROPOFOL N/A 06/28/2020   Procedure: COLONOSCOPY WITH PROPOFOL;  Surgeon: Daneil Dolin, MD;  [facility];  Service: Endoscopy;  Laterality: N/A;  am appt   CORONARY ANGIOGRAPHY N/A 05/16/2018   Procedure: CORONARY ANGIOGRAPHY (CATH LAB);  Surgeon: Belva Crome, MD;  Location: [place] [facility];  Service: Cardiovascular;  Laterality: N/A;   CORONARY ANGIOPLASTY WITH  STENT PLACEMENT  2015   ESOPHAGEAL DILATION N/A 07/05/2015   Procedure: ESOPHAGEAL DILATION;  Surgeon: Daneil Dolin, MD;  [facility];  Service: Endoscopy;  Laterality: N/A;   ESOPHAGOGASTRODUODENOSCOPY (EGD) WITH PROPOFOL N/A 07/05/2015   QJJ:HERDEY   ESOPHAGOGASTRODUODENOSCOPY (EGD) WITH PROPOFOL N/A 02/16/2020   Procedure: ESOPHAGOGASTRODUODENOSCOPY (EGD) WITH PROPOFOL;  Surgeon: Daneil Dolin, MD;  Normal examined esophagus s/p dilation, erythematous mucosa in the stomach s/p biopsy, normal examined duodenum. Pathology with mild gastropathy, negative for H. Pylori.    GIVENS CAPSULE STUDY N/A 11/10/2020   Procedure: GIVENS CAPSULE STUDY;  Surgeon: Daneil Dolin, MD;  [facility];  Service: Endoscopy;  Laterality: N/A;  7:30am   JOINT REPLACEMENT Right 2007   right knee   left hand surgery     LEFT HEART CATHETERIZATION WITH CORONARY ANGIOGRAM N/A 05/14/2013   Procedure: LEFT HEART CATHETERIZATION WITH CORONARY ANGIOGRAM;  Surgeon: Blane Ohara, MD;  Location: [facility];  Service: Cardiovascular;  Laterality: N/A;   left rotator cuff surgery     LUMBAR LAMINECTOMY/DECOMPRESSION MICRODISCECTOMY N/A 10/11/2012   Procedure: LUMBAR LAMINECTOMY/DECOMPRESSION MICRODISCECTOMY 2 LEVELS;  Surgeon: Floyce Stakes, MD;  Location: [place] [facility];  Service: Neurosurgery;  Laterality: N/A;  L3-4 L4-5 Laminectomy   LUMBAR WOUND DEBRIDEMENT N/A 09/27/2015   Procedure: Exploration of Lumbar Wound w/ Repair CSF Leak/Lumbar Drain Placement;  Surgeon: Leeroy Cha, MD;  Location: [place] [facility];  Service: Neurosurgery;  Laterality: N/A;   MALONEY DILATION  03/16/2011   Gastritis. No H.pylori on bx. 49F maloney dilation with disruption of  occult cevical esophageal web   MALONEY DILATION N/A 02/16/2020   Procedure: MALONEY DILATION;  Surgeon:  Rourk, Cristopher Estimable, MD;  [facility];  Service: Endoscopy;  Laterality: N/A;   NASAL SINUS SURGERY     right knee replacement  2007    right leg benign tumor     SHOULDER SURGERY Left    TEE WITHOUT CARDIOVERSION N/A 10/04/2017   Procedure: TRANSESOPHAGEAL ECHOCARDIOGRAM (TEE) WITH PROPOFOL;  Surgeon: Herminio Commons, MD;  [facility];  Service: Cardiovascular;  Laterality: N/A;   TEE WITHOUT CARDIOVERSION N/A 11/10/2019   Procedure: TRANSESOPHAGEAL ECHOCARDIOGRAM (TEE);  Surgeon: Josue Hector, MD;  Location: [facility];  Service: Cardiovascular;  Laterality: N/A;   TONSILLECTOMY     unspecified area, hysterectomy  1972   partial    FAMILY HISTORY: Family History  Problem Relation Age of Onset   Stroke Mother    Parkinson's disease Father    Coronary artery disease Other        family Hx-sons   Cancer Other    Stroke Other        family Hx   Hypertension Other        family Hx   Diabetes Brother    Heart disease Son        before age 80   Diabetes Son    Stroke Daughter 6   Colon cancer Grandson        diagnosed 68   Inflammatory bowel disease Neg Hx     SOCIAL HISTORY:  Social History   Socioeconomic History   Marital status: Divorced    Spouse name: Not on file   Number of children: 5   Years of education: Not on file   Highest education level: Not on file  Occupational History   Occupation: retired  Tobacco Use   Smoking status: Former    Packs/day: 1.00    Years: 64.00    Pack years: 64.00    Types: Cigarettes    Start date: 12/24/1947    Quit date: 11/17/2001    Years since quitting: 19.0   Smokeless tobacco: Never   Tobacco comments:    Quit smoking in 2003  Vaping Use   Vaping Use: Never used  Substance and Sexual Activity   Alcohol use: Not Currently   Drug use: No   Sexual activity: Never  Other Topics Concern   Not on file  Social History Narrative   Divorced.   Sister had colon perforation & died from complications in [place], [place]   Social Determinants of Health   Financial Resource Strain: Not on file  Food Insecurity: Not on file  Transportation Needs:  Not on file  Physical Activity: Not on file  Stress: Not on file  Social Connections: Not on file  Intimate Partner Violence: Not on file     PHYSICAL EXAM  Vitals:   12/14/20 1407  BP: (!) 124/7  Pulse: 73  Weight: 161 lb (73 kg)  Height: 5' 1"  (1.549 m)    Body mass index is 30.42 kg/m.   General: The patient is well-developed and well-nourished and in no acute distress  HEENT:  Head is [place]/AT.  Sclera are anicteric.  Funduscopic exam shows normal optic discs and retinal vessels.  Neck: No carotid bruits are noted.  The neck is nontender.  Cardiovascular: The heart has a regular rate and rhythm with a normal S1 and S2. There were no murmurs, gallops or rubs.    Skin: Extremities are without rash or  edema.  Musculoskeletal:  Back is nontender  Neurologic Exam  Mental  status: The patient is alert and oriented x 3 at the time of the examination. The patient has apparent normal recent and remote memory, with an apparently normal attention span and concentration ability.   Speech is normal.  Cranial nerves: Extraocular movements are full. Pupils are equal, round, and reactive to light and accomodation.  Visual fields are full.  Facial symmetry is present. There is good facial sensation to soft touch bilaterally.Facial strength is normal.  Trapezius and sternocleidomastoid strength is normal. No dysarthria is noted.  The tongue is midline, and the patient has symmetric elevation of the soft palate. No obvious hearing deficits are noted.  Motor:  Muscle bulk is normal.   Tone is normal. Strength is  5 / 5 in the arms, 4 -/5 in the right iliopsoas, 4/5 in the right quads, 4+/5 in the intrinsic foot muscles bilaterally.   Sensory: Sensory testing is intact to pinprick, soft touch and vibration sensation in arms.  She had reduced sensation in the L2 distribution of the right leg.  Pinprick sensation was reduced at the ankles and more reduced in the toes.  Vibration sensation was  normal at the ankles and mildly reduced at the toes.  Coordination: Cerebellar testing reveals good finger-nose-finger and heel-to-shin bilaterally.  Gait and station: Station is normal.   Gait is arthritic with a reduced stride and reduced elevation of the right leg as she walked.. Romberg is negative.   Reflexes: Deep tendon reflexes are symmetric and normal in the arms, absent at knees but 2+ at right ankle and absent at left ankle.   Plantar responses are flexor.    DIAGNOSTIC DATA (LABS, IMAGING, TESTING) - I reviewed patient records, labs, notes, testing and imaging myself where available.  Lab Results  Component Value Date   WBC 9.5 12/07/2020   HGB 10.4 (L) 12/07/2020   HCT 34.4 (L) 12/07/2020   MCV 78.2 (L) 12/07/2020   PLT 590 (H) 12/07/2020      Component Value Date/Time   NA 135 12/07/2020 1556   K 3.7 12/07/2020 1556   CL 96 (L) 12/07/2020 1556   CO2 28 12/07/2020 1556   GLUCOSE 155 (H) 12/07/2020 1556   BUN 24 (H) 12/07/2020 1556   CREATININE 1.27 (H) 12/07/2020 1556   CREATININE 1.42 (H) 11/16/2017 1205   CALCIUM 9.5 12/07/2020 1556   PROT 7.6 12/07/2020 1556   ALBUMIN 3.6 12/07/2020 1556   AST 26 12/07/2020 1556   ALT 17 12/07/2020 1556   ALKPHOS 99 12/07/2020 1556   BILITOT 0.5 12/07/2020 1556   GFRNONAA 41 (L) 12/07/2020 1556   GFRAA 32 (L) 11/11/2019 0420   Lab Results  Component Value Date   CHOL 152 11/10/2019   HDL 38 (L) 11/10/2019   LDLCALC 98 11/10/2019   TRIG 84 09/29/2020   CHOLHDL 4.0 11/10/2019   Lab Results  Component Value Date   HGBA1C 7.1 (H) 09/30/2020   Lab Results  Component Value Date   VITAMINB12 255 11/09/2019   Lab Results  Component Value Date   TSH 6.142 (H) 09/29/2020       ASSESSMENT AND PLAN  Lumbar radiculopathy  Failed back surgical syndrome  Numbness - Plan: Cryoglobulin, Multiple Myeloma Panel (SPEP&IFE w/QIG), Sjogren's syndrome antibods(ssa + ssb)  Neuropathy - Plan: Cryoglobulin, Multiple  Myeloma Panel (SPEP&IFE w/QIG), Sjogren's syndrome antibods(ssa + ssb)   In summary, [name] is an 87 year old woman with a history of several lumbar operations including L3-L5 fusion reporting severe episodes of back pain  and pain radiating into the proximal right leg.  Additionally, she has some numbness in the feet though denies much pain in either foot.  On exam, she has weakness in the iliopsoas more than other muscles of the right leg.    Imaging studies show a disc herniation to the right at L1-L2 likely causes right L2 nerve root compression.  Of note, it has progressed compared to the CT myelogram from 2017.  Additionally, she has retrolisthesis and foraminal narrowing greater than lateral recess stenosis at L2-L3.  Her more severe pain involving the back and proximal leg is likely due to an L2 radiculopathy on the right.  She does have pinprick greater than vibration sensory loss in the feet and could have a mild superimposed polyneuropathy, though it is unlikely to be causing her pain.  She reports that she has seen surgery in 2017 and was told that she is not a surgical candidate due to her medical issues and age.  I discussed epidural steroid injections but she reports that due to allergies to steroids she cannot get these.  Therefore, we will try to medically help the pain.  I will have her start tramadol 25 to 50 mg 3 times daily.  Because she has had difficulty tolerating tramadol in combination with gabapentin, she will stop the gabapentin.  Her gabapentin dose is low and it is unlikely to be helping her that much.  I will check lab work for treatable causes of small fiber polyneuropathy.  I will hold off on an NCV/EMG study at this time as it is unlikely to alter treatment as she has been told she is not a surgical candidate.  We will consider it if symptoms worsen and additional information is needed  They will return to see me in 3 to 4 months or sooner if there are new or worsening  neurologic symptoms.  Thank you for asking me to see [name].  Please let me know if I can be of further assistance with her or other patients in the future.  Karolina Zamor A. Felecia Shelling, MD, [facility] 3/53/2992, 4:26 PM Certified in Neurology, Clinical Neurophysiology, Sleep Medicine and Neuroimaging  [facility] [address] [place], [place] [address] [phone]

## 2020-12-15 DIAGNOSIS — J440 Chronic obstructive pulmonary disease with acute lower respiratory infection: Secondary | ICD-10-CM | POA: Diagnosis not present

## 2020-12-15 DIAGNOSIS — N1832 Chronic kidney disease, stage 3b: Secondary | ICD-10-CM | POA: Diagnosis not present

## 2020-12-15 DIAGNOSIS — I5033 Acute on chronic diastolic (congestive) heart failure: Secondary | ICD-10-CM | POA: Diagnosis not present

## 2020-12-15 DIAGNOSIS — I130 Hypertensive heart and chronic kidney disease with heart failure and stage 1 through stage 4 chronic kidney disease, or unspecified chronic kidney disease: Secondary | ICD-10-CM | POA: Diagnosis not present

## 2020-12-15 DIAGNOSIS — Z87891 Personal history of nicotine dependence: Secondary | ICD-10-CM | POA: Diagnosis not present

## 2020-12-16 ENCOUNTER — Other Ambulatory Visit: Payer: Self-pay | Admitting: Family Medicine

## 2020-12-16 ENCOUNTER — Ambulatory Visit: Payer: PPO | Admitting: Internal Medicine

## 2020-12-17 ENCOUNTER — Encounter (HOSPITAL_COMMUNITY): Payer: Self-pay

## 2020-12-17 ENCOUNTER — Other Ambulatory Visit: Payer: Self-pay

## 2020-12-17 ENCOUNTER — Inpatient Hospital Stay (HOSPITAL_COMMUNITY): Payer: PPO | Attending: Hematology and Oncology

## 2020-12-17 VITALS — BP 123/80 | HR 90 | Temp 96.7°F | Resp 18

## 2020-12-17 DIAGNOSIS — D500 Iron deficiency anemia secondary to blood loss (chronic): Secondary | ICD-10-CM | POA: Diagnosis not present

## 2020-12-17 MED ORDER — ACETAMINOPHEN 325 MG PO TABS
650.0000 mg | ORAL_TABLET | Freq: Once | ORAL | Status: AC
  Administered 2020-12-17: 650 mg via ORAL
  Filled 2020-12-17: qty 2

## 2020-12-17 MED ORDER — LORATADINE 10 MG PO TABS
10.0000 mg | ORAL_TABLET | Freq: Once | ORAL | Status: AC
  Administered 2020-12-17: 10 mg via ORAL
  Filled 2020-12-17: qty 1

## 2020-12-17 MED ORDER — SODIUM CHLORIDE 0.9 % IV SOLN
510.0000 mg | Freq: Once | INTRAVENOUS | Status: AC
  Administered 2020-12-17: 510 mg via INTRAVENOUS
  Filled 2020-12-17: qty 510

## 2020-12-17 MED ORDER — FAMOTIDINE 20 MG PO TABS
20.0000 mg | ORAL_TABLET | Freq: Once | ORAL | Status: AC
  Administered 2020-12-17: 20 mg via ORAL
  Filled 2020-12-17: qty 1

## 2020-12-17 MED ORDER — SODIUM CHLORIDE 0.9 % IV SOLN: Freq: Once | INTRAVENOUS | Status: AC

## 2020-12-17 NOTE — Patient Instructions (Signed)
[facility]  Discharge Instructions: Thank you for choosing [facility] to provide your oncology and hematology care.  If you have a lab appointment with the Cancer Center, please come in thru the Main Entrance and check in at the main information desk.  We strive to give you quality time with your provider. You may need to reschedule your appointment if you arrive late (15 or more minutes).  Arriving late affects you and other patients whose appointments are after yours.  Also, if you miss three or more appointments without notifying the office, you may be dismissed from the clinic at the provider's discretion.      For prescription refill requests, have your pharmacy contact our office and allow 72 hours for refills to be completed.     To help prevent nausea and vomiting after your treatment, we encourage you to take your nausea medication as directed.  BELOW ARE SYMPTOMS THAT SHOULD BE REPORTED IMMEDIATELY: *FEVER GREATER THAN 100.4 F (38 C) OR HIGHER *CHILLS OR SWEATING *NAUSEA AND VOMITING THAT IS NOT CONTROLLED WITH YOUR NAUSEA MEDICATION *UNUSUAL SHORTNESS OF BREATH *UNUSUAL BRUISING OR BLEEDING *URINARY PROBLEMS (pain or burning when urinating, or frequent urination) *BOWEL PROBLEMS (unusual diarrhea, constipation, pain near the anus) TENDERNESS IN MOUTH AND THROAT WITH OR WITHOUT PRESENCE OF ULCERS (sore throat, sores in mouth, or a toothache) UNUSUAL RASH, SWELLING OR PAIN  UNUSUAL VAGINAL DISCHARGE OR ITCHING   Items with * indicate a potential emergency and should be followed up as soon as possible or go to the Emergency Department if any problems should occur.  Should you have questions after your visit or need to cancel or reschedule your appointment, please contact [facility] [phone]  and follow the prompts.  Office hours are 8:00 a.m. to 4:30 p.m. Monday - Friday. Please note that voicemails left after 4:00 p.m. may not be  returned until the following business day.  We are closed weekends and major holidays. You have access to a nurse at all times for urgent questions. Please call the main number to the clinic [phone] and follow the prompts.  For any non-urgent questions, you may also contact your provider using MyChart. We now offer e-Visits for anyone 18 and older to request care online for non-urgent symptoms. For details visit [email or web address].   Also download the MyChart app! Go to the app store, search "MyChart", open the app, select [facility], and log in with your MyChart username and password.  Due to Covid, a mask is required upon entering the hospital/clinic. If you do not have a mask, one will be given to you upon arrival. For doctor visits, patients may have 1 support person aged 18 or older with them. For treatment visits, patients cannot have anyone with them due to current Covid guidelines and our immunocompromised population.  

## 2020-12-17 NOTE — Progress Notes (Signed)
Patient presents today for iron infusion.  Patient is in satisfactory condition and vital signs are stable.    Patient tolerated treatment well with no complaints voiced.  Patient left via wheelchair in stable condition.  Vital signs stable at discharge.  Follow up as scheduled.

## 2020-12-18 DIAGNOSIS — J449 Chronic obstructive pulmonary disease, unspecified: Secondary | ICD-10-CM | POA: Diagnosis not present

## 2020-12-19 LAB — MULTIPLE MYELOMA PANEL, SERUM
Albumin SerPl Elph-Mcnc: 3.4 g/dL (ref 2.9–4.4)
Albumin/Glob SerPl: 0.9 (ref 0.7–1.7)
Alpha 1: 0.2 g/dL (ref 0.0–0.4)
Alpha2 Glob SerPl Elph-Mcnc: 0.9 g/dL (ref 0.4–1.0)
B-Globulin SerPl Elph-Mcnc: 1.2 g/dL (ref 0.7–1.3)
Gamma Glob SerPl Elph-Mcnc: 1.6 g/dL (ref 0.4–1.8)
Globulin, Total: 4 g/dL — ABNORMAL HIGH (ref 2.2–3.9)
IgA/Immunoglobulin A, Serum: 248 mg/dL (ref 64–422)
IgG (Immunoglobin G), Serum: 1412 mg/dL (ref 586–1602)
IgM (Immunoglobulin M), Srm: 232 mg/dL — ABNORMAL HIGH (ref 26–217)
Total Protein: 7.4 g/dL (ref 6.0–8.5)

## 2020-12-19 LAB — SJOGREN'S SYNDROME ANTIBODS(SSA + SSB)
ENA SSA (RO) Ab: 0.3 AI (ref 0.0–0.9)
ENA SSB (LA) Ab: <0.2 AI (ref 0.0–0.9)

## 2020-12-19 LAB — CRYOGLOBULIN

## 2020-12-21 ENCOUNTER — Telehealth: Payer: Self-pay | Admitting: *Deleted

## 2020-12-21 NOTE — Telephone Encounter (Signed)
-----   Message from Britt Bottom, MD sent at 12/19/2020  8:49 PM EDT ----- Please let the patient know that the lab work is fine.

## 2020-12-21 NOTE — Telephone Encounter (Signed)
Called and spoke w/ daughter Otila Kluver about lab results (on [place]) per Dr. Felecia Shelling note. She verbalized understanding and will let her mother know.

## 2021-01-12 ENCOUNTER — Other Ambulatory Visit (HOSPITAL_COMMUNITY): Payer: Self-pay

## 2021-01-12 DIAGNOSIS — D500 Iron deficiency anemia secondary to blood loss (chronic): Secondary | ICD-10-CM

## 2021-01-13 ENCOUNTER — Other Ambulatory Visit: Payer: Self-pay

## 2021-01-13 ENCOUNTER — Inpatient Hospital Stay (HOSPITAL_COMMUNITY): Payer: PPO

## 2021-01-13 DIAGNOSIS — D500 Iron deficiency anemia secondary to blood loss (chronic): Secondary | ICD-10-CM

## 2021-01-13 LAB — CBC WITH DIFFERENTIAL/PLATELET
Abs Immature Granulocytes: 0.14 10*3/uL — ABNORMAL HIGH (ref 0.00–0.07)
Basophils Absolute: 0.1 10*3/uL (ref 0.0–0.1)
Basophils Relative: 1 %
Eosinophils Absolute: 0.2 10*3/uL (ref 0.0–0.5)
Eosinophils Relative: 3 %
HCT: 36.4 % (ref 36.0–46.0)
Hemoglobin: 11.8 g/dL — ABNORMAL LOW (ref 12.0–15.0)
Immature Granulocytes: 2 %
Lymphocytes Relative: 21 %
Lymphs Abs: 1.9 10*3/uL (ref 0.7–4.0)
MCH: 27 pg (ref 26.0–34.0)
MCHC: 32.4 g/dL (ref 30.0–36.0)
MCV: 83.3 fL (ref 80.0–100.0)
Monocytes Absolute: 0.8 10*3/uL (ref 0.1–1.0)
Monocytes Relative: 8 %
Neutro Abs: 6.2 10*3/uL (ref 1.7–7.7)
Neutrophils Relative %: 65 %
Platelets: 303 10*3/uL (ref 150–400)
RBC: 4.37 MIL/uL (ref 3.87–5.11)
WBC: 9.3 10*3/uL (ref 4.0–10.5)
nRBC: 0 % (ref 0.0–0.2)

## 2021-01-13 LAB — IRON AND TIBC
Iron: 53 ug/dL (ref 28–170)
Saturation Ratios: 19 % (ref 10.4–31.8)
TIBC: 286 ug/dL (ref 250–450)
UIBC: 233 ug/dL

## 2021-01-13 LAB — FERRITIN: Ferritin: 242 ng/mL (ref 11–307)

## 2021-01-14 DIAGNOSIS — I5033 Acute on chronic diastolic (congestive) heart failure: Secondary | ICD-10-CM | POA: Diagnosis not present

## 2021-01-14 DIAGNOSIS — N1832 Chronic kidney disease, stage 3b: Secondary | ICD-10-CM | POA: Diagnosis not present

## 2021-01-14 DIAGNOSIS — Z87891 Personal history of nicotine dependence: Secondary | ICD-10-CM | POA: Diagnosis not present

## 2021-01-14 DIAGNOSIS — I130 Hypertensive heart and chronic kidney disease with heart failure and stage 1 through stage 4 chronic kidney disease, or unspecified chronic kidney disease: Secondary | ICD-10-CM | POA: Diagnosis not present

## 2021-01-14 DIAGNOSIS — J440 Chronic obstructive pulmonary disease with acute lower respiratory infection: Secondary | ICD-10-CM | POA: Diagnosis not present

## 2021-01-20 ENCOUNTER — Inpatient Hospital Stay (HOSPITAL_COMMUNITY): Payer: PPO | Attending: Hematology and Oncology | Admitting: Physician Assistant

## 2021-01-20 VITALS — BP 124/74 | HR 79 | Temp 96.7°F | Resp 22 | Wt 160.5 lb

## 2021-01-20 DIAGNOSIS — Z87891 Personal history of nicotine dependence: Secondary | ICD-10-CM | POA: Diagnosis not present

## 2021-01-20 DIAGNOSIS — D500 Iron deficiency anemia secondary to blood loss (chronic): Secondary | ICD-10-CM | POA: Insufficient documentation

## 2021-01-20 DIAGNOSIS — Z79899 Other long term (current) drug therapy: Secondary | ICD-10-CM | POA: Diagnosis not present

## 2021-01-20 DIAGNOSIS — K649 Unspecified hemorrhoids: Secondary | ICD-10-CM | POA: Insufficient documentation

## 2021-01-20 NOTE — Progress Notes (Signed)
[address], [place] [address]   CLINIC:  Medical Oncology/Hematology  PCP:  [address] [place] [address] [phone]   REASON FOR VISIT:  Follow-up for iron deficiency anemia  CURRENT THERAPY: Intermittent IV iron infusions  INTERVAL HISTORY:  [name] 87 y.o. female returns for routine follow-up of her iron deficiency anemia.  She was seen by Dr. Lorenso Courier on 12/07/2020 and received IV iron with Feraheme x2 given on 12/10/2020 and 12/17/2020.  At today's visit, she reports feeling fair.  No recent hospitalizations, surgeries, or changes in baseline health status.  She tolerated IV iron well and reports that she did feel a bit better afterwards, reported that she was not quite as tired.  Her cravings for ice have resolved.  However, she does continue to have multiple chronic complaints (further discussed in ROS below).  She does continue to feel weak and fatigued, but she attributes this partially to poor appetite and inadequate nutrition.  She has chronic dyspnea on exertion and chest pain, which are associated in part to anxiety.  She continues to follow with cardiologist.  No syncopal episodes.  She continues to have bleeding from her hemorrhoids after bowel movements, but states that this has improved.  She was unfortunately not a candidate for banding or surgery due to her age and other medical comorbidities.  She is taking stool softeners, which is helping. She admits to hematochezia, but denies melena or hematemesis.  She continues to take Eliquis for atrial fibrillation.  She has 15% energy and 25% appetite. She endorses that she is maintaining a stable weight.    REVIEW OF SYSTEMS:  Review of Systems  Constitutional:  Positive for appetite change and fatigue. Negative for chills, diaphoresis, fever and unexpected weight change.  HENT:   Negative for lump/mass and nosebleeds.   Eyes:  Negative for eye  problems.  Respiratory:  Positive for shortness of breath. Negative for cough and hemoptysis.   Cardiovascular:  Positive for chest pain. Negative for leg swelling and palpitations.  Gastrointestinal:  Negative for abdominal pain, blood in stool, constipation, diarrhea, nausea and vomiting.  Genitourinary:  Negative for hematuria.        UTI  Skin: Negative.   Neurological:  Positive for dizziness. Negative for headaches and light-headedness.  Hematological:  Does not bruise/bleed easily.  Psychiatric/Behavioral:  Positive for sleep disturbance. The patient is nervous/anxious.      PAST MEDICAL/SURGICAL HISTORY:  Past Medical History:  Diagnosis Date   Anxiety    Arthritis    Atrial fibrillation (HCC)    Bursitis    Left shoulder   Cataract    CHF (congestive heart failure) (HCC)    CKD (chronic kidney disease)    stage 3-4   COPD (chronic obstructive pulmonary disease) (HCC)    Coronary atherosclerosis of native coronary artery    a. s/p DES to LCx in 04/2013 b. cath in 11/2015 showing patent stent with 20% prox-LAD and 80% ostial RCA stenosis for which medical management was recommended due to small artery size   Depression    Diastolic heart failure (HCC)    EF 55-60%   Dysphagia, unspecified(787.20)    Dyspnea    Dysrhythmia    Essential hypertension    GERD (gastroesophageal reflux disease)    Hx Schatzki's ring, multiple EGD/ED last 01/06/2004   Gout    Headache    History of anemia    Hyperlipidemia  Internal hemorrhoids without mention of complication    MI (myocardial infarction) ([facility]) 2006   Microscopic colitis 2003   Panic disorder without agoraphobia    Paresthesia    Pneumonia 12/2011   PVD (peripheral vascular disease) (HCC)    S/P colonoscopy 09/27/2001   internal hemorrhoids, desc colon inflam polyp, SB BX-chronic duodenitis, colitis   Sleep apnea    Thyroid disease    Past Surgical History:  Procedure Laterality Date   ABDOMINAL HYSTERECTOMY      ABDOMINAL HYSTERECTOMY     AGILE CAPSULE N/A 08/30/2020   Procedure: AGILE CAPSULE;  Surgeon: Daneil Dolin, MD;  [facility];  Service: Endoscopy;  Laterality: N/A;  7:30am   ANTERIOR AND POSTERIOR REPAIR     with resection of vagina   ANTERIOR LAT LUMBAR FUSION N/A 08/01/2016   Procedure: Lumbar Two-Lumbar Five Transpsoas lateral interbody fusion with Lumbar Two-Three lateral plate fixation;  Surgeon: Kevan Ny Ditty, MD;  Location: [facility];  Service: Neurosurgery;  Laterality: N/A;  L2-5 Transpsoas lateral interbody fusion with L2-3 lateral plate fixation   APPENDECTOMY     BACK SURGERY     BIOPSY  07/05/2015   Procedure: BIOPSY;  Surgeon: Daneil Dolin, MD;  [facility];  Service: Endoscopy;;  gastric polyp biopsy, ascending colon biopsy   BIOPSY  02/16/2020   Procedure: BIOPSY;  Surgeon: Daneil Dolin, MD;  [facility];  Service: Endoscopy;;   BIOPSY  06/28/2020   Procedure: BIOPSY;  Surgeon: Daneil Dolin, MD;  [facility];  Service: Endoscopy;;   BLADDER SUSPENSION  11/09/2011   Procedure: TRANSVAGINAL TAPE (TVT) PROCEDURE;  Surgeon: Marissa Nestle, MD;  [facility];  Service: Urology;  Laterality: N/A;   bladder tack  06/2010   BREAST LUMPECTOMY  1998   left, benign   CARDIAC CATHETERIZATION     CARDIAC CATHETERIZATION     CARDIAC CATHETERIZATION N/A 12/16/2015   Procedure: Left Heart Cath and Coronary Angiography;  Surgeon: Troy Sine, MD;  Location: [facility];  Service: Cardiovascular;  Laterality: N/A;   CARDIOVERSION N/A 10/04/2017   Procedure: CARDIOVERSION;  Surgeon: Herminio Commons, MD;  [facility];  Service: Cardiovascular;  Laterality: N/A;   CARDIOVERSION N/A 01/30/2018   Procedure: CARDIOVERSION;  Surgeon: Herminio Commons, MD;  [facility];  Service: Cardiovascular;  Laterality: N/A;   CARDIOVERSION N/A 11/10/2019   Procedure: CARDIOVERSION;  Surgeon: Josue Hector, MD;   Location: [facility];  Service: Cardiovascular;  Laterality: N/A;   [place]   left   cataract surgery     CHOLECYSTECTOMY  1998   Cholecystectomy     COLONOSCOPY  03/16/2011   multiple hyperplastic colon polyps, sigmoid diverticulosis, melanosis coli   COLONOSCOPY WITH PROPOFOL N/A 07/05/2015   RMR:one 5 mm polyp in descending colon   COLONOSCOPY WITH PROPOFOL N/A 06/28/2020   Procedure: COLONOSCOPY WITH PROPOFOL;  Surgeon: Daneil Dolin, MD;  [facility];  Service: Endoscopy;  Laterality: N/A;  am appt   CORONARY ANGIOGRAPHY N/A 05/16/2018   Procedure: CORONARY ANGIOGRAPHY (CATH LAB);  Surgeon: Belva Crome, MD;  Location: [facility];  Service: Cardiovascular;  Laterality: N/A;   CORONARY ANGIOPLASTY WITH STENT PLACEMENT  2015   ESOPHAGEAL DILATION N/A 07/05/2015   Procedure: ESOPHAGEAL DILATION;  Surgeon: Daneil Dolin, MD;  [facility];  Service: Endoscopy;  Laterality: N/A;  ESOPHAGOGASTRODUODENOSCOPY (EGD) WITH PROPOFOL N/A 07/05/2015   UMP:NTIRWE   ESOPHAGOGASTRODUODENOSCOPY (EGD) WITH PROPOFOL N/A 02/16/2020   Procedure: ESOPHAGOGASTRODUODENOSCOPY (EGD) WITH PROPOFOL;  Surgeon: Daneil Dolin, MD;  Normal examined esophagus s/p dilation, erythematous mucosa in the stomach s/p biopsy, normal examined duodenum. Pathology with mild gastropathy, negative for H. Pylori.    GIVENS CAPSULE STUDY N/A 11/10/2020   Procedure: GIVENS CAPSULE STUDY;  Surgeon: Daneil Dolin, MD;  [facility];  Service: Endoscopy;  Laterality: N/A;  7:30am   JOINT REPLACEMENT Right 2007   right knee   left hand surgery     LEFT HEART CATHETERIZATION WITH CORONARY ANGIOGRAM N/A 05/14/2013   Procedure: LEFT HEART CATHETERIZATION WITH CORONARY ANGIOGRAM;  Surgeon: Blane Ohara, MD;  Location: [facility] LAB;  Service: Cardiovascular;  Laterality: N/A;   left rotator cuff surgery     LUMBAR LAMINECTOMY/DECOMPRESSION MICRODISCECTOMY N/A 10/11/2012    Procedure: LUMBAR LAMINECTOMY/DECOMPRESSION MICRODISCECTOMY 2 LEVELS;  Surgeon: Floyce Stakes, MD;  Location: [facility];  Service: Neurosurgery;  Laterality: N/A;  L3-4 L4-5 Laminectomy   LUMBAR WOUND DEBRIDEMENT N/A 09/27/2015   Procedure: Exploration of Lumbar Wound w/ Repair CSF Leak/Lumbar Drain Placement;  Surgeon: Leeroy Cha, MD;  Location: [facility];  Service: Neurosurgery;  Laterality: N/A;   MALONEY DILATION  03/16/2011   Gastritis. No H.pylori on bx. 57F maloney dilation with disruption of  occult cevical esophageal web   MALONEY DILATION N/A 02/16/2020   Procedure: MALONEY DILATION;  Surgeon: Daneil Dolin, MD;  [facility];  Service: Endoscopy;  Laterality: N/A;   NASAL SINUS SURGERY     right knee replacement  2007   right leg benign tumor     SHOULDER SURGERY Left    TEE WITHOUT CARDIOVERSION N/A 10/04/2017   Procedure: TRANSESOPHAGEAL ECHOCARDIOGRAM (TEE) WITH PROPOFOL;  Surgeon: Herminio Commons, MD;  [facility];  Service: Cardiovascular;  Laterality: N/A;   TEE WITHOUT CARDIOVERSION N/A 11/10/2019   Procedure: TRANSESOPHAGEAL ECHOCARDIOGRAM (TEE);  Surgeon: Josue Hector, MD;  Location: [facility] ENDOSCOPY;  Service: Cardiovascular;  Laterality: N/A;   TONSILLECTOMY     unspecified area, hysterectomy  1972   partial     SOCIAL HISTORY:  Social History   Socioeconomic History   Marital status: Divorced    Spouse name: Not on file   Number of children: 5   Years of education: Not on file   Highest education level: Not on file  Occupational History   Occupation: retired  Tobacco Use   Smoking status: Former    Packs/day: 1.00    Years: 64.00    Pack years: 64.00    Types: Cigarettes    Start date: 12/24/1947    Quit date: 11/17/2001    Years since quitting: 19.1   Smokeless tobacco: Never   Tobacco comments:    Quit smoking in 2003  Vaping Use   Vaping Use: Never used  Substance and Sexual Activity   Alcohol use: Not Currently    Drug use: No   Sexual activity: Never  Other Topics Concern   Not on file  Social History Narrative   Divorced.   Sister had colon perforation & died from complications in [place], [place]   Social Determinants of Health   Financial Resource Strain: Not on file  Food Insecurity: Not on file  Transportation Needs: Not on file  Physical Activity: Not on file  Stress: Not on file  Social Connections: Not on file  Intimate  Partner Violence: Not on file    FAMILY HISTORY:  Family History  Problem Relation Age of Onset   Stroke Mother    Parkinson's disease Father    Coronary artery disease Other        family Hx-sons   Cancer Other    Stroke Other        family Hx   Hypertension Other        family Hx   Diabetes Brother    Heart disease Son        before age 30   Diabetes Son    Stroke Daughter 75   Colon cancer Grandson        diagnosed 76   Inflammatory bowel disease Neg Hx     CURRENT MEDICATIONS:  Outpatient Encounter Medications as of 01/20/2021  Medication Sig   acetaminophen (TYLENOL) 500 MG tablet Take 500 mg by mouth every 6 (six) hours as needed for headache or moderate pain.   ALPRAZolam (XANAX) 0.5 MG tablet Take 0.5 mg by mouth daily as needed for anxiety or sleep.   apixaban (ELIQUIS) 5 MG TABS tablet Take 1 tablet (5 mg total) by mouth 2 (two) times daily.   ascorbic acid (VITAMIN C) 500 MG tablet Take 1 tablet (500 mg total) by mouth daily.   diltiazem (CARDIZEM CD) 120 MG 24 hr capsule TAKE ONE CAPSULE BY MOUTH DAILY   DULoxetine (CYMBALTA) 60 MG capsule Take 60 mg by mouth at bedtime.   ferrous sulfate 325 (65 FE) MG tablet Take 1 tablet (325 mg total) by mouth daily with breakfast.   fluticasone (CUTIVATE) 0.05 % cream Apply 1 application topically 2 (two) times daily as needed (skin tears).   furosemide (LASIX) 40 MG tablet Take 1.5 tablets (60 mg total) by mouth daily.   gabapentin (NEURONTIN) 100 MG capsule Take 1 capsule (100 mg total) by mouth 4 (four)  times daily.   hydrocortisone (ANUSOL-HC) 2.5 % rectal cream Place 1 application rectally 2 (two) times daily.   isosorbide mononitrate (IMDUR) 60 MG 24 hr tablet Take 60 mg by mouth daily.   magnesium oxide (MAG-OX) 400 MG tablet TAKE ONE TABLET BY MOUTH DAILY   metoprolol succinate (TOPROL-XL) 25 MG 24 hr tablet Take 75 mg by mouth daily.   Multiple Vitamins-Minerals (HAIR/SKIN/NAILS) CAPS Take 1 tablet by mouth daily.   Naphazoline HCl (CLEAR EYES OP) Place 1 drop into both eyes daily as needed (itching).   nitroGLYCERIN (NITROSTAT) 0.4 MG SL tablet DISSOLVE ONE TABLET UNDER TONGUE EVERY 5 MINUTES UP TO 3 DOSES AS NEEDED FOR CHEST PAIN.   pantoprazole (PROTONIX) 40 MG tablet TAKE ONE TABLET BY MOUTH TWICE DAILY BEFORE MEALS   potassium chloride (KLOR-CON) 10 MEQ tablet Take 1 tablet (10 mEq total) by mouth daily.   traMADol (ULTRAM) 50 MG tablet Take 1 tablet (50 mg total) by mouth every 6 (six) hours as needed.   triamcinolone cream (KENALOG) 0.1 % Apply topically 2 (two) times daily.   zinc sulfate 220 (50 Zn) MG capsule Take 1 capsule (220 mg total) by mouth daily.   No facility-administered encounter medications on file as of 01/20/2021.    ALLERGIES:  Allergies  Allergen Reactions   Cephalosporins Diarrhea and Nausea Only    Lightheaded   Levaquin [Levofloxacin In D5w] Swelling   Macrodantin [Nitrofurantoin Macrocrystal] Swelling   Phenothiazines Anaphylaxis and Hives   Polysorbate Anaphylaxis   Prednisone Shortness Of Breath   Buspirone Itching   Cardura [Doxazosin Mesylate] Itching  Codeine Itching   Acyclovir And Related Itching    Redness of skin   Colcrys [Colchicine] Nausea Only    Upset stomach    Prochlorperazine Other (See Comments)    "Upset stomach"   Ranexa [Ranolazine]     Severe drop in BP   Atorvastatin Hives    Cramping; tolerates Crestor ok   Colestipol Palpitations   Ofloxacin Rash   Other Itching and Rash    "WOOL"= make skin look like it has been  burned   Penicillins Other (See Comments)    Causes redness all over. Has patient had a PCN reaction causing immediate rash, facial/tongue/throat swelling, SOB or lightheadedness with hypotension: No Has patient had a PCN reaction causing severe rash involving mucus membranes or skin necrosis: No Has patient had a PCN reaction that required hospitalization No Has patient had a PCN reaction occurring within the last 10 years: No If all of the above answers are "NO", then may proceed with Cephalosporin use.    Pimozide Hives and Itching     PHYSICAL EXAM:  ECOG PERFORMANCE STATUS: 2 - Symptomatic, <50% confined to bed  There were no vitals filed for this visit. There were no vitals filed for this visit. Physical Exam Constitutional:      Appearance: Normal appearance. She is obese.  HENT:     Head: Normocephalic and atraumatic.     Mouth/Throat:     Mouth: Mucous membranes are moist.  Eyes:     Extraocular Movements: Extraocular movements intact.     Pupils: Pupils are equal, round, and reactive to light.  Cardiovascular:     Rate and Rhythm: Normal rate. Rhythm irregular.     Pulses: Normal pulses.     Heart sounds: Normal heart sounds.  Pulmonary:     Effort: Pulmonary effort is normal.     Breath sounds: Normal breath sounds.  Abdominal:     General: Bowel sounds are normal.     Palpations: Abdomen is soft.     Tenderness: There is no abdominal tenderness.  Musculoskeletal:        General: No swelling.     Right lower leg: No edema.     Left lower leg: No edema.  Lymphadenopathy:     Cervical: No cervical adenopathy.  Skin:    General: Skin is warm and dry.  Neurological:     General: No focal deficit present.     Mental Status: She is alert and oriented to person, place, and time.  Psychiatric:        Mood and Affect: Mood normal.        Behavior: Behavior normal.     LABORATORY DATA:  I have reviewed the labs as listed.  CBC    Component Value Date/Time    WBC 9.3 01/13/2021 1342   RBC 4.37 01/13/2021 1342   HGB 11.8 (L) 01/13/2021 1342   HCT 36.4 01/13/2021 1342   PLT 303 01/13/2021 1342   MCV 83.3 01/13/2021 1342   MCH 27.0 01/13/2021 1342   MCHC 32.4 01/13/2021 1342   RDW Not Measured 01/13/2021 1342   LYMPHSABS 1.9 01/13/2021 1342   MONOABS 0.8 01/13/2021 1342   EOSABS 0.2 01/13/2021 1342   BASOSABS 0.1 01/13/2021 1342   CMP Latest Ref Rng & Units 12/14/2020 12/07/2020 11/17/2020  Glucose 70 - 99 mg/dL - 155(H) 152(H)  BUN 8 - 23 mg/dL - 24(H) 17  Creatinine 0.44 - 1.00 mg/dL - 1.27(H) 1.09(H)  Sodium 135 - 145 mmol/L -  135 133(L)  Potassium 3.5 - 5.1 mmol/L - 3.7 3.5  Chloride 98 - 111 mmol/L - 96(L) 97(L)  CO2 22 - 32 mmol/L - 28 26  Calcium 8.9 - 10.3 mg/dL - 9.5 8.7(L)  Total Protein 6.0 - 8.5 g/dL 7.4 7.6 7.3  Total Bilirubin 0.3 - 1.2 mg/dL - 0.5 0.7  Alkaline Phos 38 - 126 U/L - 99 123  AST 15 - 41 U/L - 26 19  ALT 0 - 44 U/L - 17 17    DIAGNOSTIC IMAGING:  I have independently reviewed the relevant imaging and discussed with the patient.  ASSESSMENT & PLAN: 1.  Iron deficiency anemia due to chronic GI blood loss - Iron deficiency anemia secondary to GI blood loss from hemorrhoids - She follows with GI, was also evaluated by general surgery but is not a candidate for surgical intervention or banding - Colonoscopy (06/28/2020): Internal and external hemorrhoids - EGD (02/16/2020): Normal esophagus, erythematous mucosa of the stomach, normal duodenum - She took oral iron supplementation without improvement, and with worsening constipation - Received IV Feraheme x2 on 12/10/2020 and 12/17/2020 - Symptoms are mildly improved after IV iron, but she does continue to have chronic fatigue, dyspnea on exertion, and chest pain related to her other comorbidities - Bleeding from her hemorrhoids has improved after starting stool softener, but she still has some blood with each bowel movement. - Most recent labs (01/13/2021):  Hemoglobin improved at 11.8 with MCV 83.3, ferritin 242, iron saturation 19% - PLAN: No indication for IV iron at this time.  We will repeat labs and phone visit in 2 months, but if she remains stable we may space out her frequency of visits a bit further in the future.  We will defer management of hemorrhoids to gastroenterology and surgery.   PLAN SUMMARY & DISPOSITION: -Labs in 2 months - RTC after labs  All questions were answered. The patient knows to call the clinic with any problems, questions or concerns.  Medical decision making: Low  Time spent on visit: I spent 15 minutes counseling the patient face to face. The total time spent in the appointment was 25 minutes and more than 50% was on counseling.   Harriett Rush, PA-C  01/20/2021 2:39 PM

## 2021-01-20 NOTE — Patient Instructions (Signed)
[place] [facility] Discharge Instructions  You were seen today by Tarri Abernethy PA-C for your iron deficiency anemia.  Your iron levels are now normal, and your blood counts is improved.  However, you will most likely continue to have low iron levels and low blood for the rest of your life due to the bleeding from your hemorrhoids.  My goal is to stay ahead of any severe anemia by treating any low iron levels before they get too serious.  We will check your labs and see you again in 2 months to see if you need more IV iron at that time.  LABS: Return in 2 months for repeat labs  OTHER TESTS: No other tests at this time  MEDICATIONS: No changes to home medications  FOLLOW-UP APPOINTMENT: Office visit in 2 months, after labs   Thank you for choosing [place] [facility] to provide your oncology and hematology care.  To afford each patient quality time with our provider, please arrive at least 15 minutes before your scheduled appointment time.   If you have a lab appointment with the [facility] please come in thru the Main Entrance and check in at the main information desk.  You need to re-schedule your appointment should you arrive 10 or more minutes late.  We strive to give you quality time with our providers, and arriving late affects you and other patients whose appointments are after yours.  Also, if you no show three or more times for appointments you may be dismissed from the clinic at the providers discretion.     Again, thank you for choosing [facility].  Our hope is that these requests will decrease the amount of time that you wait before being seen by our physicians.       _____________________________________________________________  Should you have questions after your visit to [facility], please contact our office at [phone] and follow the prompts.  Our office hours are 8:00 a.m. and  4:30 p.m. Monday - Friday.  Please note that voicemails left after 4:00 p.m. may not be returned until the following business day.  We are closed weekends and major holidays.  You do have access to a nurse 24-7, just call the main number to the clinic [phone] and do not press any options, hold on the line and a nurse will answer the phone.    For prescription refill requests, have your pharmacy contact our office and allow 72 hours.    Due to Covid, you will need to wear a mask upon entering the hospital. If you do not have a mask, a mask will be given to you at the Main Entrance upon arrival. For doctor visits, patients may have 1 support person age 19 or older with them. For treatment visits, patients can not have anyone with them due to social distancing guidelines and our immunocompromised population.

## 2021-02-01 DIAGNOSIS — Z6829 Body mass index (BMI) 29.0-29.9, adult: Secondary | ICD-10-CM | POA: Diagnosis not present

## 2021-02-01 DIAGNOSIS — R300 Dysuria: Secondary | ICD-10-CM | POA: Diagnosis not present

## 2021-02-21 NOTE — Progress Notes (Signed)
Cardiology Office Note  Date: 02/22/2021   ID: SUZY KUGEL, DOB [birth year], MRN [id number]  PCP:  Rosalee Kaufman, PA-C  Cardiologist:  Carlyle Dolly, MD Electrophysiologist:  Cristopher Peru, MD   Chief Complaint: 84-month follow-up  History of Present Illness: [name] is a 87 y.o. female with a history of acute on chronic diastolic heart failure, CKD, COPD, atrial fibrillation, HTN, GERD, HLD, PVD, sleep apnea.  She was last seen by Dr. Harl Bowie on 11/22/2020.  She had no recent edema.  She had an ER visit on August 3 with shortness of breath.  Chest x-ray showed some signs of edema with BNP of 387.  Lasix had been increased to 60 mg daily on November 15, 2020.  Weight during visit was 170 pounds.  In July she was 173 pounds.  She had a prior DCCV/TEE performed July 2021 with 3 shocks for resulting in normal sinus rhythm.  Had issues with atrial fibrillation with RVR during June 2020 for admission with COVID.  Rates have normalized.  Eliquis had been on hold due to recent GI bleeding followed by GI.  Prior echocardiogram on February 2022 with EF 60 to 65%, no WMA's, interest, diastolic function.  She was still having some fluid overload.  Plan was to continue Lasix 60 mg daily and repeat BMP, magnesium and BNP.  She continued off Eliquis.  Recent GI bleeding.  Plan was to restart  if okay with GI.  She is here for 14-month follow-up today.  She states in the interim since last visit she has self adjusted her Lasix down to 40 mg daily and has restarted her Eliquis at previous dose of 5 mg p.o. twice daily.  She states she has an occasional bleeding from hemorrhoids but not excessive bleeding.  Blood pressure is well controlled currently.  He states the only complaint she has at the moment as she has some bad knee pain and was thinking about going to the emergency room for treatment.  She has a ride from her hip to her knee from previous surgery.  She states this is the reason she is in a  wheelchair because it is very painful to ambulate on her right knee current pain she is experiencing.  She denies any DOE or SOB, PND, orthopnea.  No lower extremity edema.  Her weight has decreased about 8 pounds since October 6 when she weighed 160.  Today's weight is 152.  Blood pressure is well controlled today at 120/78.  Past Medical History:  Diagnosis Date   Anxiety    Arthritis    Atrial fibrillation (HCC)    Bursitis    Left shoulder   Cataract    CHF (congestive heart failure) (HCC)    CKD (chronic kidney disease)    stage 3-4   COPD (chronic obstructive pulmonary disease) (HCC)    Coronary atherosclerosis of native coronary artery    a. s/p DES to LCx in 04/2013 b. cath in 11/2015 showing patent stent with 20% prox-LAD and 80% ostial RCA stenosis for which medical management was recommended due to small artery size   Depression    Diastolic heart failure (HCC)    EF 55-60%   Dysphagia, unspecified(787.20)    Dyspnea    Dysrhythmia    Essential hypertension    GERD (gastroesophageal reflux disease)    Hx Schatzki's ring, multiple EGD/ED last 01/06/2004   Gout    Headache    History of anemia  Hyperlipidemia    Internal hemorrhoids without mention of complication    MI (myocardial infarction) ([place]) 2006   Microscopic colitis 2003   Panic disorder without agoraphobia    Paresthesia    Pneumonia 12/2011   PVD (peripheral vascular disease) (HCC)    S/P colonoscopy 09/27/2001   internal hemorrhoids, desc colon inflam polyp, SB BX-chronic duodenitis, colitis   Sleep apnea    Thyroid disease     Past Surgical History:  Procedure Laterality Date   ABDOMINAL HYSTERECTOMY     ABDOMINAL HYSTERECTOMY     AGILE CAPSULE N/A 08/30/2020   Procedure: AGILE CAPSULE;  Surgeon: Daneil Dolin, MD;  [facility];  Service: Endoscopy;  Laterality: N/A;  7:30am   ANTERIOR AND POSTERIOR REPAIR     with resection of vagina   ANTERIOR LAT LUMBAR FUSION N/A 08/01/2016    Procedure: Lumbar Two-Lumbar Five Transpsoas lateral interbody fusion with Lumbar Two-Three lateral plate fixation;  Surgeon: Kevan Ny Ditty, MD;  Location: [place];  Service: Neurosurgery;  Laterality: N/A;  L2-5 Transpsoas lateral interbody fusion with L2-3 lateral plate fixation   APPENDECTOMY     BACK SURGERY     BIOPSY  07/05/2015   Procedure: BIOPSY;  Surgeon: Daneil Dolin, MD;  [facility];  Service: Endoscopy;;  gastric polyp biopsy, ascending colon biopsy   BIOPSY  02/16/2020   Procedure: BIOPSY;  Surgeon: Daneil Dolin, MD;  [facility];  Service: Endoscopy;;   BIOPSY  06/28/2020   Procedure: BIOPSY;  Surgeon: Daneil Dolin, MD;  [facility];  Service: Endoscopy;;   BLADDER SUSPENSION  11/09/2011   Procedure: TRANSVAGINAL TAPE (TVT) PROCEDURE;  Surgeon: Marissa Nestle, MD;  [facility]: [facility];  Service: Urology;  Laterality: N/A;   bladder tack  06/2010   BREAST LUMPECTOMY  1998   left, benign   CARDIAC CATHETERIZATION     CARDIAC CATHETERIZATION     CARDIAC CATHETERIZATION N/A 12/16/2015   Procedure: Left Heart Cath and Coronary Angiography;  Surgeon: Troy Sine, MD;  Location: [facility];  Service: Cardiovascular;  Laterality: N/A;   CARDIOVERSION N/A 10/04/2017   Procedure: CARDIOVERSION;  Surgeon: Herminio Commons, MD;  [facility];  Service: Cardiovascular;  Laterality: N/A;   CARDIOVERSION N/A 01/30/2018   Procedure: CARDIOVERSION;  Surgeon: Herminio Commons, MD;  [facility];  Service: Cardiovascular;  Laterality: N/A;   CARDIOVERSION N/A 11/10/2019   Procedure: CARDIOVERSION;  Surgeon: Josue Hector, MD;  Location: [facility];  Service: Cardiovascular;  Laterality: N/A;   [place]   left   cataract surgery     CHOLECYSTECTOMY  1998   Cholecystectomy     COLONOSCOPY  03/16/2011   multiple hyperplastic colon polyps, sigmoid diverticulosis, melanosis coli   COLONOSCOPY WITH  PROPOFOL N/A 07/05/2015   RMR:one 5 mm polyp in descending colon   COLONOSCOPY WITH PROPOFOL N/A 06/28/2020   Procedure: COLONOSCOPY WITH PROPOFOL;  Surgeon: Daneil Dolin, MD;  [facility];  Service: Endoscopy;  Laterality: N/A;  am appt   CORONARY ANGIOGRAPHY N/A 05/16/2018   Procedure: CORONARY ANGIOGRAPHY (CATH LAB);  Surgeon: Belva Crome, MD;  Location: [place] [facility];  Service: Cardiovascular;  Laterality: N/A;   CORONARY ANGIOPLASTY WITH STENT PLACEMENT  2015   ESOPHAGEAL DILATION N/A 07/05/2015   Procedure: ESOPHAGEAL DILATION;  Surgeon: Daneil Dolin, MD;  [facility];  Service: Endoscopy;  Laterality: N/A;   ESOPHAGOGASTRODUODENOSCOPY (EGD) WITH PROPOFOL N/A 07/05/2015   MEQ:ASTMHD   ESOPHAGOGASTRODUODENOSCOPY (EGD) WITH PROPOFOL N/A 02/16/2020   Procedure: ESOPHAGOGASTRODUODENOSCOPY (EGD) WITH PROPOFOL;  Surgeon: Daneil Dolin, MD;  Normal examined esophagus s/p dilation, erythematous mucosa in the stomach s/p biopsy, normal examined duodenum. Pathology with mild gastropathy, negative for H. Pylori.    GIVENS CAPSULE STUDY N/A 11/10/2020   Procedure: GIVENS CAPSULE STUDY;  Surgeon: Daneil Dolin, MD;  [facility];  Service: Endoscopy;  Laterality: N/A;  7:30am   JOINT REPLACEMENT Right 2007   right knee   left hand surgery     LEFT HEART CATHETERIZATION WITH CORONARY ANGIOGRAM N/A 05/14/2013   Procedure: LEFT HEART CATHETERIZATION WITH CORONARY ANGIOGRAM;  Surgeon: Blane Ohara, MD;  Location: [facility] C[facility] LAB;  Service: Cardiovascular;  Laterality: N/A;   left rotator cuff surgery     LUMBAR LAMINECTOMY/DECOMPRESSION MICRODISCECTOMY N/A 10/11/2012   Procedure: LUMBAR LAMINECTOMY/DECOMPRESSION MICRODISCECTOMY 2 LEVELS;  Surgeon: Floyce Stakes, MD;  Location: [place] [facility];  Service: Neurosurgery;  Laterality: N/A;  L3-4 L4-5 Laminectomy   LUMBAR WOUND DEBRIDEMENT N/A 09/27/2015   Procedure: Exploration of Lumbar Wound w/ Repair CSF  Leak/Lumbar Drain Placement;  Surgeon: Leeroy Cha, MD;  Location: [place] [facility];  Service: Neurosurgery;  Laterality: N/A;   MALONEY DILATION  03/16/2011   Gastritis. No H.pylori on bx. 66F maloney dilation with disruption of  occult cevical esophageal web   MALONEY DILATION N/A 02/16/2020   Procedure: MALONEY DILATION;  Surgeon: Daneil Dolin, MD;  [facility];  Service: Endoscopy;  Laterality: N/A;   NASAL SINUS SURGERY     right knee replacement  2007   right leg benign tumor     SHOULDER SURGERY Left    TEE WITHOUT CARDIOVERSION N/A 10/04/2017   Procedure: TRANSESOPHAGEAL ECHOCARDIOGRAM (TEE) WITH PROPOFOL;  Surgeon: Herminio Commons, MD;  [facility];  Service: Cardiovascular;  Laterality: N/A;   TEE WITHOUT CARDIOVERSION N/A 11/10/2019   Procedure: TRANSESOPHAGEAL ECHOCARDIOGRAM (TEE);  Surgeon: Josue Hector, MD;  Location: [facility] ENDOSCOPY;  Service: Cardiovascular;  Laterality: N/A;   TONSILLECTOMY     unspecified area, hysterectomy  1972   partial    Current Outpatient Medications  Medication Sig Dispense Refill   acetaminophen (TYLENOL) 500 MG tablet Take 500 mg by mouth every 6 (six) hours as needed for headache or moderate pain.     ALPRAZolam (XANAX) 0.5 MG tablet Take 0.5 mg by mouth daily as needed for anxiety or sleep.     apixaban (ELIQUIS) 5 MG TABS tablet Take 1 tablet (5 mg total) by mouth 2 (two) times daily. 180 tablet 3   ascorbic acid (VITAMIN C) 500 MG tablet Take 1 tablet (500 mg total) by mouth daily. 30 tablet 2   diltiazem (CARDIZEM CD) 120 MG 24 hr capsule TAKE ONE CAPSULE BY MOUTH DAILY 90 capsule 3   DULoxetine (CYMBALTA) 60 MG capsule Take 60 mg by mouth at bedtime.     ferrous sulfate 325 (65 FE) MG tablet Take 1 tablet (325 mg total) by mouth daily with breakfast. 30 tablet 3   fluticasone (CUTIVATE) 0.05 % cream Apply 1 application topically 2 (two) times daily as needed (skin tears).     hydrocortisone (ANUSOL-HC) 2.5 % rectal  cream Place 1 application rectally 2 (two) times daily. 30 g 1   isosorbide mononitrate (IMDUR) 60 MG 24 hr tablet Take 60 mg by mouth daily.  magnesium oxide (MAG-OX) 400 MG tablet TAKE ONE TABLET BY MOUTH DAILY 90 tablet 3   metoprolol succinate (TOPROL-XL) 25 MG 24 hr tablet Take 75 mg by mouth daily.     Multiple Vitamins-Minerals (HAIR/SKIN/NAILS) CAPS Take 1 tablet by mouth daily.     Naphazoline HCl (CLEAR EYES OP) Place 1 drop into both eyes daily as needed (itching).     nitroGLYCERIN (NITROSTAT) 0.4 MG SL tablet DISSOLVE ONE TABLET UNDER TONGUE EVERY 5 MINUTES UP TO 3 DOSES AS NEEDED FOR CHEST PAIN. 25 tablet 6   pantoprazole (PROTONIX) 40 MG tablet TAKE ONE TABLET BY MOUTH TWICE DAILY BEFORE MEALS 60 tablet 5   potassium chloride (KLOR-CON) 10 MEQ tablet Take 1 tablet (10 mEq total) by mouth daily. 180 tablet 1   traMADol (ULTRAM) 50 MG tablet Take 1 tablet (50 mg total) by mouth every 6 (six) hours as needed. 90 tablet 3   triamcinolone cream (KENALOG) 0.1 % Apply topically 2 (two) times daily.     zinc sulfate 220 (50 Zn) MG capsule Take 1 capsule (220 mg total) by mouth daily. 30 capsule 3   furosemide (LASIX) 40 MG tablet Take 1 tablet (40 mg total) by mouth daily.     No current facility-administered medications for this visit.   Allergies:  Cephalosporins, Levaquin [levofloxacin in d5w], Macrodantin [nitrofurantoin macrocrystal], Phenothiazines, Polysorbate, Prednisone, Buspirone, Cardura [doxazosin mesylate], Codeine, Acyclovir and related, Colcrys [colchicine], Prochlorperazine, Ranexa [ranolazine], Atorvastatin, Colestipol, Ofloxacin, Other, Penicillins, and Pimozide   Social History: The patient  reports that she quit smoking about 19 years ago. Her smoking use included cigarettes. She started smoking about 73 years ago. She has a 64.00 pack-year smoking history. She has never used smokeless tobacco. She reports that she does not currently use alcohol. She reports that she  does not use drugs.   Family History: The patient's family history includes Cancer in an other family member; Colon cancer in her grandson; Coronary artery disease in an other family member; Diabetes in her brother and son; Heart disease in her son; Hypertension in an other family member; Parkinson's disease in her father; Stroke in her mother and another family member; Stroke (age of onset: 78) in her daughter.   ROS:  Please see the history of present illness. Otherwise, complete review of systems is positive for none.  All other systems are reviewed and negative.   Physical Exam: VS:  BP 120/78   Pulse 91   Ht 5\' 1"  (1.549 m)   Wt 152 lb (68.9 kg)   SpO2 100%   BMI 28.72 kg/m , BMI Body mass index is 28.72 kg/m.  Wt Readings from Last 3 Encounters:  02/22/21 152 lb (68.9 kg)  01/20/21 160 lb 7.9 oz (72.8 kg)  12/14/20 161 lb (73 kg)    General: Patient appears comfortable at rest. Neck: Supple, no elevated JVP or carotid bruits, no thyromegaly. Lungs: Clear to auscultation, nonlabored breathing at rest. Cardiac: Irregularly irregular rate and rhythm, no S3 or significant systolic murmur, no pericardial rub. Extremities: No pitting edema, distal pulses 2+. Skin: Warm and dry. Musculoskeletal: No kyphosis. Neuropsychiatric: Alert and oriented x3, affect grossly appropriate.  ECG:  EKG [name] on 11/17/2020 atrial fibrillation with RVR rate of 101.  Recent Labwork: 09/29/2020: TSH 6.142 11/17/2020: B Natriuretic Peptide 387.0; Magnesium 2.0 12/07/2020: ALT 17; AST 26; BUN 24; Creatinine, Ser 1.27; Potassium 3.7; Sodium 135 01/13/2021: Hemoglobin 11.8; Platelets 303     Component Value Date/Time   CHOL 152  11/10/2019 0502   TRIG 84 09/29/2020 2159   HDL 38 (L) 11/10/2019 0502   CHOLHDL 4.0 11/10/2019 0502   VLDL 16 11/10/2019 0502   LDLCALC 98 11/10/2019 0502    Other Studies Reviewed Today:  Cardiac Catheterization: 05/16/2018 Patent short left main 20 to 30% proximal  LAD irregularity, with diffuse 20% narrowing within the mid vessel. Patent proximal circumflex stent with mid to distal circumflex irregularities up to 30 to 40%. Nondominant right coronary with ostial 90% narrowing. No hemodynamics recorded.  Left ventriculography was not performed in an effort to decrease contrast exposure in the setting of CKD.   RECOMMENDATIONS:   Compared to prior angiography in 2017, no significant changes noted. Chest pain is atypical and may be more musculoskeletal or pleuritic. Resume Eliquis in 12 hours/a.m. of 05/17/2018   Echocardiogram 10/02/18:   1. The left ventricle has normal systolic function with an ejection  fraction of 60-65%. The cavity size was normal. There is mildly increased  left ventricular wall thickness. Left ventricular diastolic Doppler  parameters are consistent with impaired  relaxation. Elevated mean left atrial pressure.   2. The right ventricle has normal systolic function. The cavity was  normal. There is no increase in right ventricular wall thickness.   3. Left atrial size was mildly dilated.   4. The aortic valve is tricuspid. Mild thickening of the aortic valve.  Mild calcification of the aortic valve. Aortic valve regurgitation is mild  by color flow Doppler. No stenosis of the aortic valve. Mild aortic  annular calcification noted.   5. The mitral valve is abnormal. Mild thickening of the mitral valve  leaflet. Mild calcification of the mitral valve leaflet. There is mild  mitral annular calcification present. No evidence of mitral valve  stenosis.   6. The aortic root is normal in size and structure.   7. Pulmonary hypertension is indeterminant, inadequate TR jet.      Event monitor 05/13/19:   1. NSR 2. NSVT, lasting upto 6 beats 3. No prolonged pauses or bradycardia 4. No atrial fib     09/2019 echo 1. Left ventricular ejection fraction, by estimation, is 60 to 65%. The  left ventricle has normal function. The left  ventricle has no regional  wall motion abnormalities. There is mild left ventricular hypertrophy.  Left ventricular diastolic parameters  are indeterminate.   2. Right ventricular systolic function is normal. The right ventricular  size is normal. There is normal pulmonary artery systolic pressure.   3. The mitral valve is normal in structure. Trivial mitral valve  regurgitation. No evidence of mitral stenosis.   4. The aortic valve was not well visualized. Aortic valve regurgitation  is mild. No aortic stenosis is present.   05/2020 echo IMPRESSIONS     1. Left ventricular ejection fraction, by estimation, is 60 to 65%. The  left ventricle has normal function. The left ventricle has no regional  wall motion abnormalities. Left ventricular diastolic parameters are  indeterminate.   2. Right ventricular systolic function is normal. The right ventricular  size is normal.   3. The mitral valve is normal in structure. No evidence of mitral valve  regurgitation. No evidence of mitral stenosis.   4. The aortic valve has an indeterminant number of cusps. There is mild  calcification of the aortic valve. There is mild thickening of the aortic  valve. Aortic valve regurgitation is trivial. No aortic stenosis is  present.   5. The inferior vena cava is normal  in size with greater than 50%  respiratory variability, suggesting right atrial pressure of 3 mmHg.   05/2020 ABI Summary:  Right: Resting right ankle-brachial index indicates moderate right lower  extremity arterial disease. The right toe-brachial index is abnormal. RT  great toe pressure = 74 mmHg.   Left: Resting left ankle-brachial index indicates moderate left lower  extremity arterial disease. The left toe-brachial index is abnormal. LT  Great toe pressure = 73 mmHg. ThatCardiac Catheterization: 05/16/2018 Patent short left main 20 to 30% proximal LAD irregularity, with diffuse 20% narrowing within the mid vessel. Patent  proximal circumflex stent with mid to distal circumflex irregularities up to 30 to 40%. Nondominant right coronary with ostial 90% narrowing. No hemodynamics recorded.  Left ventriculography was not performed in an effort to decrease contrast exposure in the setting of CKD.   RECOMMENDATIONS:   Compared to prior angiography in 2017, no significant changes noted. Chest pain is atypical and may be more musculoskeletal or pleuritic. Resume Eliquis in 12 hours/a.m. of 05/17/2018   Echocardiogram 10/02/18:   1. The left ventricle has normal systolic function with an ejection  fraction of 60-65%. The cavity size was normal. There is mildly increased  left ventricular wall thickness. Left ventricular diastolic Doppler  parameters are consistent with impaired  relaxation. Elevated mean left atrial pressure.   2. The right ventricle has normal systolic function. The cavity was  normal. There is no increase in right ventricular wall thickness.   3. Left atrial size was mildly dilated.   4. The aortic valve is tricuspid. Mild thickening of the aortic valve.  Mild calcification of the aortic valve. Aortic valve regurgitation is mild  by color flow Doppler. No stenosis of the aortic valve. Mild aortic  annular calcification noted.   5. The mitral valve is abnormal. Mild thickening of the mitral valve  leaflet. Mild calcification of the mitral valve leaflet. There is mild  mitral annular calcification present. No evidence of mitral valve  stenosis.   6. The aortic root is normal in size and structure.   7. Pulmonary hypertension is indeterminant, inadequate TR jet.      Event monitor 05/13/19:   1. NSR 2. NSVT, lasting upto 6 beats 3. No prolonged pauses or bradycardia 4. No atrial fib     09/2019 echo 1. Left ventricular ejection fraction, by estimation, is 60 to 65%. The  left ventricle has normal function. The left ventricle has no regional  wall motion abnormalities. There is mild left  ventricular hypertrophy.  Left ventricular diastolic parameters  are indeterminate.   2. Right ventricular systolic function is normal. The right ventricular  size is normal. There is normal pulmonary artery systolic pressure.   3. The mitral valve is normal in structure. Trivial mitral valve  regurgitation. No evidence of mitral stenosis.   4. The aortic valve was not well visualized. Aortic valve regurgitation  is mild. No aortic stenosis is present.   05/2020 echo IMPRESSIONS     1. Left ventricular ejection fraction, by estimation, is 60 to 65%. The  left ventricle has normal function. The left ventricle has no regional  wall motion abnormalities. Left ventricular diastolic parameters are  indeterminate.   2. Right ventricular systolic function is normal. The right ventricular  size is normal.   3. The mitral valve is normal in structure. No evidence of mitral valve  regurgitation. No evidence of mitral stenosis.   4. The aortic valve has an indeterminant number of cusps. There  is mild  calcification of the aortic valve. There is mild thickening of the aortic  valve. Aortic valve regurgitation is trivial. No aortic stenosis is  present.   5. The inferior vena cava is normal in size with greater than 50%  respiratory variability, suggesting right atrial pressure of 3 mmHg.   05/2020 ABI Summary:  Right: Resting right ankle-brachial index indicates moderate right lower  extremity arterial disease. The right toe-brachial index is abnormal. RT  great toe pressure = 74 mmHg.   Left: Resting left ankle-brachial index indicates moderate left lower  extremity arterial disease. The left toe-brachial index is abnormal. LT  Great toe pressure = 73 mmHg.   Assessment and Plan:  1. Acute on chronic diastolic (congestive) heart failure ([place])   2. Atrial fibrillation, unspecified type ([place])   3. CAD in native artery    1. Acute on chronic diastolic (congestive) heart failure  (HCC) Denies any resting DOE, SOB, PND, orthopnea.  No weight gain, no lower extremity edema.  Lungs are clear to auscultation.  Continue Lasix 40 mg p.o. daily.  Continue potassium 10 mEq p.o. daily.  2. Atrial fibrillation, unspecified type ([place]) Rate is controlled today at 91.  Continue Cardizem CD 120 mg p.o. daily.  Continue apixaban 5 mg p.o. twice daily.  Continue Toprol-XL 75 mg p.o. daily.  3.  CAD Continue Imdur 60 mg p.o. daily continue sublingual nitroglycerin as needed.  Continue Toprol-XL 75 mg p.o. daily.  Denies any anginal symptoms.  No nitroglycerin use.  Medication Adjustments/Labs and Tests Ordered: Current medicines are reviewed at length with the patient today.  Concerns regarding medicines are outlined above.   Disposition: Follow-up with Dr. Harl Bowie or APP 6 months Signed, Levell July, NP 02/22/2021 12:32 PM    [facility] [place], [place], [place] [address] Phone: [phone]; Fax: [phone]

## 2021-02-22 ENCOUNTER — Ambulatory Visit: Payer: PPO | Admitting: Family Medicine

## 2021-02-22 ENCOUNTER — Encounter: Payer: Self-pay | Admitting: Family Medicine

## 2021-02-22 VITALS — BP 120/78 | HR 91 | Ht 61.0 in | Wt 152.0 lb

## 2021-02-22 DIAGNOSIS — I4891 Unspecified atrial fibrillation: Secondary | ICD-10-CM

## 2021-02-22 DIAGNOSIS — I2510 Atherosclerotic heart disease of native coronary artery without angina pectoris: Secondary | ICD-10-CM | POA: Diagnosis not present

## 2021-02-22 DIAGNOSIS — I5033 Acute on chronic diastolic (congestive) heart failure: Secondary | ICD-10-CM | POA: Diagnosis not present

## 2021-02-22 MED ORDER — FUROSEMIDE 40 MG PO TABS: 40.0000 mg | ORAL_TABLET | Freq: Every day | ORAL | Status: DC

## 2021-02-22 NOTE — Patient Instructions (Addendum)

## 2021-03-14 ENCOUNTER — Other Ambulatory Visit: Payer: Self-pay | Admitting: Cardiology

## 2021-03-16 ENCOUNTER — Other Ambulatory Visit: Payer: Self-pay

## 2021-03-16 ENCOUNTER — Inpatient Hospital Stay (HOSPITAL_COMMUNITY): Payer: PPO | Attending: Hematology

## 2021-03-16 DIAGNOSIS — D500 Iron deficiency anemia secondary to blood loss (chronic): Secondary | ICD-10-CM | POA: Diagnosis not present

## 2021-03-16 LAB — CBC WITH DIFFERENTIAL/PLATELET
Abs Immature Granulocytes: 0.11 10*3/uL — ABNORMAL HIGH (ref 0.00–0.07)
Basophils Absolute: 0.1 10*3/uL (ref 0.0–0.1)
Basophils Relative: 1 %
Eosinophils Absolute: 0.2 10*3/uL (ref 0.0–0.5)
Eosinophils Relative: 2 %
HCT: 39 % (ref 36.0–46.0)
Hemoglobin: 12.9 g/dL (ref 12.0–15.0)
Immature Granulocytes: 1 %
Lymphocytes Relative: 22 %
Lymphs Abs: 2.4 10*3/uL (ref 0.7–4.0)
MCH: 29.7 pg (ref 26.0–34.0)
MCHC: 33.1 g/dL (ref 30.0–36.0)
MCV: 89.9 fL (ref 80.0–100.0)
Monocytes Absolute: 0.7 10*3/uL (ref 0.1–1.0)
Monocytes Relative: 7 %
Neutro Abs: 7.3 10*3/uL (ref 1.7–7.7)
Neutrophils Relative %: 67 %
Platelets: 331 10*3/uL (ref 150–400)
RBC: 4.34 MIL/uL (ref 3.87–5.11)
RDW: 15.9 % — ABNORMAL HIGH (ref 11.5–15.5)
WBC: 10.8 10*3/uL — ABNORMAL HIGH (ref 4.0–10.5)
nRBC: 0 % (ref 0.0–0.2)

## 2021-03-16 LAB — IRON AND TIBC
Iron: 61 ug/dL (ref 28–170)
Saturation Ratios: 19 % (ref 10.4–31.8)
TIBC: 324 ug/dL (ref 250–450)
UIBC: 263 ug/dL

## 2021-03-16 LAB — FERRITIN: Ferritin: 178 ng/mL (ref 11–307)

## 2021-03-22 ENCOUNTER — Ambulatory Visit: Payer: PPO | Admitting: Neurology

## 2021-03-22 NOTE — Progress Notes (Signed)
[place], [place] [address]   CLINIC:  Medical Oncology/Hematology  PCP:  R[place] [address] [phone]   REASON FOR VISIT:  Follow-up for iron deficiency anemia  CURRENT THERAPY: Intermittent IV iron infusions  INTERVAL HISTORY:  Ms. Stammen 87 y.o. female returns for routine follow-up of iron deficiency anemia.  She was last seen by Tarri Abernethy on 01/20/2021.  Her last IV iron was on 12/17/2020.  She is accompanied today by her daughter, Otila Kluver.  At today's visit, she reports feeling somewhat poorly today due to her chronic pain.  No recent hospitalizations, surgeries, or changes in baseline health status.  She continues to have scant hemorrhoidal bleeding when she wipes after bowel movements, but has not had any major hematochezia since she started taking regular stool softener.  No other source of bleeding such as hematemesis, melena, or epistaxis.  She continues to take Eliquis for atrial fibrillation.  Although her blood and iron levels are currently within normal limits, she does have high burden of symptoms from her other chronic disease.  This includes generalized fatigue and chronically low energy, as well as headaches, and lightheadedness with standing.  She complains of intermittent leg cramps and muscle spasms.  She has chronic intermittent dyspnea on exertion and chest pain, and is following with a cardiologist.  She has 40% energy and 25% appetite. She has been slowly losing weight over the last year due to poor appetite and decreased oral intake.   REVIEW OF SYSTEMS:  Review of Systems  Constitutional:  Positive for fatigue. Negative for appetite change, chills, diaphoresis, fever and unexpected weight change.  HENT:   Negative for lump/mass and nosebleeds.   Eyes:  Negative for eye problems.  Respiratory:  Positive for cough (Dry) and shortness of breath (With exertion). Negative  for hemoptysis.   Cardiovascular:  Positive for chest pain. Negative for leg swelling and palpitations.  Gastrointestinal:  Negative for abdominal pain, blood in stool, constipation, diarrhea, nausea and vomiting.  Genitourinary:  Positive for frequency. Negative for hematuria.   Skin: Negative.   Neurological:  Positive for numbness. Negative for dizziness, headaches and light-headedness.  Hematological:  Does not bruise/bleed easily.  Psychiatric/Behavioral:  Positive for sleep disturbance.      PAST MEDICAL/SURGICAL HISTORY:  Past Medical History:  Diagnosis Date   Anxiety    Arthritis    Atrial fibrillation (HCC)    Bursitis    Left shoulder   Cataract    CHF (congestive heart failure) (HCC)    CKD (chronic kidney disease)    stage 3-4   COPD (chronic obstructive pulmonary disease) (HCC)    Coronary atherosclerosis of native coronary artery    a. s/p DES to LCx in 04/2013 b. cath in 11/2015 showing patent stent with 20% prox-LAD and 80% ostial RCA stenosis for which medical management was recommended due to small artery size   Depression    Diastolic heart failure (HCC)    EF 55-60%   Dysphagia, unspecified(787.20)    Dyspnea    Dysrhythmia    Essential hypertension    GERD (gastroesophageal reflux disease)    Hx Schatzki's ring, multiple EGD/ED last 01/06/2004   Gout    Headache    History of anemia    Hyperlipidemia    Internal hemorrhoids without mention of complication    MI (myocardial infarction) ([facility]) 2006   Microscopic colitis 2003   Panic disorder without agoraphobia  Paresthesia    Pneumonia 12/2011   PVD (peripheral vascular disease) (HCC)    S/P colonoscopy 09/27/2001   internal hemorrhoids, desc colon inflam polyp, SB BX-chronic duodenitis, colitis   Sleep apnea    Thyroid disease    Past Surgical History:  Procedure Laterality Date   ABDOMINAL HYSTERECTOMY     ABDOMINAL HYSTERECTOMY     AGILE CAPSULE N/A 08/30/2020   Procedure: AGILE CAPSULE;   Surgeon: Daneil Dolin, MD;  [facility];  Service: Endoscopy;  Laterality: N/A;  7:30am   ANTERIOR AND POSTERIOR REPAIR     with resection of vagina   ANTERIOR LAT LUMBAR FUSION N/A 08/01/2016   Procedure: Lumbar Two-Lumbar Five Transpsoas lateral interbody fusion with Lumbar Two-Three lateral plate fixation;  Surgeon: Kevan Ny Ditty, MD;  Location: [facility];  Service: Neurosurgery;  Laterality: N/A;  L2-5 Transpsoas lateral interbody fusion with L2-3 lateral plate fixation   APPENDECTOMY     BACK SURGERY     BIOPSY  07/05/2015   Procedure: BIOPSY;  Surgeon: Daneil Dolin, MD;  [facility];  Service: Endoscopy;;  gastric polyp biopsy, ascending colon biopsy   BIOPSY  02/16/2020   Procedure: BIOPSY;  Surgeon: Daneil Dolin, MD;  [facility];  Service: Endoscopy;;   BIOPSY  06/28/2020   Procedure: BIOPSY;  Surgeon: Daneil Dolin, MD;  [facility];  Service: Endoscopy;;   BLADDER SUSPENSION  11/09/2011   Procedure: TRANSVAGINAL TAPE (TVT) PROCEDURE;  Surgeon: Marissa Nestle, MD;  [facility]: [facility];  Service: Urology;  Laterality: N/A;   bladder tack  06/2010   BREAST LUMPECTOMY  1998   left, benign   CARDIAC CATHETERIZATION     CARDIAC CATHETERIZATION     CARDIAC CATHETERIZATION N/A 12/16/2015   Procedure: Left Heart Cath and Coronary Angiography;  Surgeon: Troy Sine, MD;  Location: [facility];  Service: Cardiovascular;  Laterality: N/A;   CARDIOVERSION N/A 10/04/2017   Procedure: CARDIOVERSION;  Surgeon: Herminio Commons, MD;  [facility];  Service: Cardiovascular;  Laterality: N/A;   CARDIOVERSION N/A 01/30/2018   Procedure: CARDIOVERSION;  Surgeon: Herminio Commons, MD;  [facility];  Service: Cardiovascular;  Laterality: N/A;   CARDIOVERSION N/A 11/10/2019   Procedure: CARDIOVERSION;  Surgeon: Josue Hector, MD;  Location: [facility];  Service: Cardiovascular;  Laterality: N/A;   [place]   left   cataract surgery     CHOLECYSTECTOMY  1998   Cholecystectomy     COLONOSCOPY  03/16/2011   multiple hyperplastic colon polyps, sigmoid diverticulosis, melanosis coli   COLONOSCOPY WITH PROPOFOL N/A 07/05/2015   RMR:one 5 mm polyp in descending colon   COLONOSCOPY WITH PROPOFOL N/A 06/28/2020   Procedure: COLONOSCOPY WITH PROPOFOL;  Surgeon: Daneil Dolin, MD;  [facility];  Service: Endoscopy;  Laterality: N/A;  am appt   CORONARY ANGIOGRAPHY N/A 05/16/2018   Procedure: CORONARY ANGIOGRAPHY (CATH LAB);  Surgeon: Belva Crome, MD;  Location: [facility];  Service: Cardiovascular;  Laterality: N/A;   CORONARY ANGIOPLASTY WITH STENT PLACEMENT  2015   ESOPHAGEAL DILATION N/A 07/05/2015   Procedure: ESOPHAGEAL DILATION;  Surgeon: Daneil Dolin, MD;  [facility];  Service: Endoscopy;  Laterality: N/A;   ESOPHAGOGASTRODUODENOSCOPY (EGD) WITH PROPOFOL N/A 07/05/2015   [id number]   ESOPHAGOGASTRODUODENOSCOPY (EGD) WITH PROPOFOL N/A 02/16/2020   Procedure: ESOPHAGOGASTRODUODENOSCOPY (EGD) WITH PROPOFOL;  Surgeon: Manus Rudd  M, MD;  Normal examined esophagus s/p dilation, erythematous mucosa in the stomach s/p biopsy, normal examined duodenum. Pathology with mild gastropathy, negative for H. Pylori.    GIVENS CAPSULE STUDY N/A 11/10/2020   Procedure: GIVENS CAPSULE STUDY;  Surgeon: Daneil Dolin, MD;  [facility];  Service: Endoscopy;  Laterality: N/A;  7:30am   JOINT REPLACEMENT Right 2007   right knee   left hand surgery     LEFT HEART CATHETERIZATION WITH CORONARY ANGIOGRAM N/A 05/14/2013   Procedure: LEFT HEART CATHETERIZATION WITH CORONARY ANGIOGRAM;  Surgeon: Blane Ohara, MD;  Location: [facility];  Service: Cardiovascular;  Laterality: N/A;   left rotator cuff surgery     LUMBAR LAMINECTOMY/DECOMPRESSION MICRODISCECTOMY N/A 10/11/2012   Procedure: LUMBAR LAMINECTOMY/DECOMPRESSION MICRODISCECTOMY 2 LEVELS;  Surgeon:  Floyce Stakes, MD;  Location: [facility];  Service: Neurosurgery;  Laterality: N/A;  L3-4 L4-5 Laminectomy   LUMBAR WOUND DEBRIDEMENT N/A 09/27/2015   Procedure: Exploration of Lumbar Wound w/ Repair CSF Leak/Lumbar Drain Placement;  Surgeon: Leeroy Cha, MD;  Location: [facility];  Service: Neurosurgery;  Laterality: N/A;   MALONEY DILATION  03/16/2011   Gastritis. No H.pylori on bx. 54F maloney dilation with disruption of  occult cevical esophageal web   MALONEY DILATION N/A 02/16/2020   Procedure: MALONEY DILATION;  Surgeon: Daneil Dolin, MD;  [facility];  Service: Endoscopy;  Laterality: N/A;   NASAL SINUS SURGERY     right knee replacement  2007   right leg benign tumor     SHOULDER SURGERY Left    TEE WITHOUT CARDIOVERSION N/A 10/04/2017   Procedure: TRANSESOPHAGEAL ECHOCARDIOGRAM (TEE) WITH PROPOFOL;  Surgeon: Herminio Commons, MD;  [facility];  Service: Cardiovascular;  Laterality: N/A;   TEE WITHOUT CARDIOVERSION N/A 11/10/2019   Procedure: TRANSESOPHAGEAL ECHOCARDIOGRAM (TEE);  Surgeon: Josue Hector, MD;  Location: [facility] ENDOSCOPY;  Service: Cardiovascular;  Laterality: N/A;   TONSILLECTOMY     unspecified area, hysterectomy  1972   partial     SOCIAL HISTORY:  Social History   Socioeconomic History   Marital status: Divorced    Spouse name: Not on file   Number of children: 5   Years of education: Not on file   Highest education level: Not on file  Occupational History   Occupation: retired  Tobacco Use   Smoking status: Former    Packs/day: 1.00    Years: 64.00    Pack years: 64.00    Types: Cigarettes    Start date: 12/24/1947    Quit date: 11/17/2001    Years since quitting: 19.3   Smokeless tobacco: Never   Tobacco comments:    Quit smoking in 2003  Vaping Use   Vaping Use: Never used  Substance and Sexual Activity   Alcohol use: Not Currently   Drug use: No   Sexual activity: Never  Other Topics Concern   Not on file   Social History Narrative   Divorced.   Sister had colon perforation & died from complications in [place], [place]   Social Determinants of Health   Financial Resource Strain: Not on file  Food Insecurity: Not on file  Transportation Needs: Not on file  Physical Activity: Not on file  Stress: Not on file  Social Connections: Not on file  Intimate Partner Violence: Not on file    FAMILY HISTORY:  Family History  Problem Relation Age of Onset   Stroke Mother    Parkinson's disease  Father    Coronary artery disease Other        family Hx-sons   Cancer Other    Stroke Other        family Hx   Hypertension Other        family Hx   Diabetes Brother    Heart disease Son        before age 68   Diabetes Son    Stroke Daughter 87   Colon cancer Grandson        diagnosed 28   Inflammatory bowel disease Neg Hx     CURRENT MEDICATIONS:  Outpatient Encounter Medications as of 03/23/2021  Medication Sig   potassium chloride (KLOR-CON) 10 MEQ tablet TAKE ONE TABLET BY MOUTH DAILY,   acetaminophen (TYLENOL) 500 MG tablet Take 500 mg by mouth every 6 (six) hours as needed for headache or moderate pain.   ALPRAZolam (XANAX) 0.5 MG tablet Take 0.5 mg by mouth daily as needed for anxiety or sleep.   apixaban (ELIQUIS) 5 MG TABS tablet Take 1 tablet (5 mg total) by mouth 2 (two) times daily.   ascorbic acid (VITAMIN C) 500 MG tablet Take 1 tablet (500 mg total) by mouth daily.   diltiazem (CARDIZEM CD) 120 MG 24 hr capsule TAKE ONE CAPSULE BY MOUTH DAILY   DULoxetine (CYMBALTA) 60 MG capsule Take 60 mg by mouth at bedtime.   ferrous sulfate 325 (65 FE) MG tablet Take 1 tablet (325 mg total) by mouth daily with breakfast.   fluticasone (CUTIVATE) 0.05 % cream Apply 1 application topically 2 (two) times daily as needed (skin tears).   furosemide (LASIX) 40 MG tablet Take 1 tablet (40 mg total) by mouth daily.   hydrocortisone (ANUSOL-HC) 2.5 % rectal cream Place 1 application rectally 2 (two) times  daily.   isosorbide mononitrate (IMDUR) 60 MG 24 hr tablet Take 60 mg by mouth daily.   magnesium oxide (MAG-OX) 400 MG tablet TAKE ONE TABLET BY MOUTH DAILY   metoprolol succinate (TOPROL-XL) 25 MG 24 hr tablet Take 75 mg by mouth daily.   Multiple Vitamins-Minerals (HAIR/SKIN/NAILS) CAPS Take 1 tablet by mouth daily.   Naphazoline HCl (CLEAR EYES OP) Place 1 drop into both eyes daily as needed (itching).   nitroGLYCERIN (NITROSTAT) 0.4 MG SL tablet DISSOLVE ONE TABLET UNDER TONGUE EVERY 5 MINUTES UP TO 3 DOSES AS NEEDED FOR CHEST PAIN.   pantoprazole (PROTONIX) 40 MG tablet TAKE ONE TABLET BY MOUTH TWICE DAILY BEFORE MEALS   traMADol (ULTRAM) 50 MG tablet Take 1 tablet (50 mg total) by mouth every 6 (six) hours as needed.   triamcinolone cream (KENALOG) 0.1 % Apply topically 2 (two) times daily.   zinc sulfate 220 (50 Zn) MG capsule Take 1 capsule (220 mg total) by mouth daily.   No facility-administered encounter medications on file as of 03/23/2021.    ALLERGIES:  Allergies  Allergen Reactions   Cephalosporins Diarrhea and Nausea Only    Lightheaded   Levaquin [Levofloxacin In D5w] Swelling   Macrodantin [Nitrofurantoin Macrocrystal] Swelling   Phenothiazines Anaphylaxis and Hives   Polysorbate Anaphylaxis   Prednisone Shortness Of Breath   Buspirone Itching   Cardura [Doxazosin Mesylate] Itching   Codeine Itching   Acyclovir And Related Itching    Redness of skin   Colcrys [Colchicine] Nausea Only    Upset stomach    Prochlorperazine Other (See Comments)    "Upset stomach"   Ranexa [Ranolazine]     Severe drop in BP  Atorvastatin Hives    Cramping; tolerates Crestor ok   Colestipol Palpitations   Ofloxacin Rash   Other Itching and Rash    "WOOL"= make skin look like it has been burned   Penicillins Other (See Comments)    Causes redness all over. Has patient had a PCN reaction causing immediate rash, facial/tongue/throat swelling, SOB or lightheadedness with  hypotension: No Has patient had a PCN reaction causing severe rash involving mucus membranes or skin necrosis: No Has patient had a PCN reaction that required hospitalization No Has patient had a PCN reaction occurring within the last 10 years: No If all of the above answers are "NO", then may proceed with Cephalosporin use.    Pimozide Hives and Itching     PHYSICAL EXAM:  ECOG PERFORMANCE STATUS: 2 - Symptomatic, <50% confined to bed  There were no vitals filed for this visit. There were no vitals filed for this visit. Physical Exam Constitutional:      Appearance: Normal appearance. She is obese.  HENT:     Head: Normocephalic and atraumatic.     Mouth/Throat:     Mouth: Mucous membranes are moist.  Eyes:     Extraocular Movements: Extraocular movements intact.     Pupils: Pupils are equal, round, and reactive to light.  Cardiovascular:     Rate and Rhythm: Normal rate. Rhythm irregular.     Pulses: Normal pulses.     Heart sounds: Normal heart sounds.  Pulmonary:     Effort: Pulmonary effort is normal.     Breath sounds: Normal breath sounds.  Abdominal:     General: Bowel sounds are normal.     Palpations: Abdomen is soft.     Tenderness: There is no abdominal tenderness.  Musculoskeletal:        General: No swelling.     Right lower leg: No edema.     Left lower leg: No edema.  Lymphadenopathy:     Cervical: No cervical adenopathy.  Skin:    General: Skin is warm and dry.  Neurological:     General: No focal deficit present.     Mental Status: She is alert and oriented to person, place, and time.  Psychiatric:        Mood and Affect: Mood normal.        Behavior: Behavior normal.     LABORATORY DATA:  I have reviewed the labs as listed.  CBC    Component Value Date/Time   WBC 10.8 (H) 03/16/2021 1345   RBC 4.34 03/16/2021 1345   HGB 12.9 03/16/2021 1345   HCT 39.0 03/16/2021 1345   PLT 331 03/16/2021 1345   MCV 89.9 03/16/2021 1345   MCH 29.7  03/16/2021 1345   MCHC 33.1 03/16/2021 1345   RDW 15.9 (H) 03/16/2021 1345   LYMPHSABS 2.4 03/16/2021 1345   MONOABS 0.7 03/16/2021 1345   EOSABS 0.2 03/16/2021 1345   BASOSABS 0.1 03/16/2021 1345   CMP Latest Ref Rng & Units 12/14/2020 12/07/2020 11/17/2020  Glucose 70 - 99 mg/dL - 155(H) 152(H)  BUN 8 - 23 mg/dL - 24(H) 17  Creatinine 0.44 - 1.00 mg/dL - 1.27(H) 1.09(H)  Sodium 135 - 145 mmol/L - 135 133(L)  Potassium 3.5 - 5.1 mmol/L - 3.7 3.5  Chloride 98 - 111 mmol/L - 96(L) 97(L)  CO2 22 - 32 mmol/L - 28 26  Calcium 8.9 - 10.3 mg/dL - 9.5 8.7(L)  Total Protein 6.0 - 8.5 g/dL 7.4 7.6 7.3  Total  Bilirubin 0.3 - 1.2 mg/dL - 0.5 0.7  Alkaline Phos 38 - 126 U/L - 99 123  AST 15 - 41 U/L - 26 19  ALT 0 - 44 U/L - 17 17    DIAGNOSTIC IMAGING:  I have independently reviewed the relevant imaging and discussed with the patient.  ASSESSMENT & PLAN: 1.  Iron deficiency anemia due to chronic GI blood loss - Iron deficiency anemia secondary to GI blood loss from hemorrhoids - She follows with GI, was also evaluated by general surgery but is not a candidate for surgical intervention or banding - Colonoscopy (06/28/2020): Internal and external hemorrhoids - EGD (02/16/2020): Normal esophagus, erythematous mucosa of the stomach, normal duodenum - She took oral iron supplementation without improvement, and with worsening constipation - Received IV Feraheme x2 on 12/10/2020 and 12/17/2020 - Symptoms were mildly improved after IV iron, but she does continue to have chronic fatigue, dyspnea on exertion, and chest pain related to her other comorbidities   - Bleeding from her hemorrhoids has improved after starting stool softener, but she still has scant blood after wiping with each bowel movement.   - Most recent labs (03/16/2021): Hemoglobin improved at 12.9 with MCV 89.9, ferritin 178, iron saturation 19% - PLAN: No indication for IV iron at this time.  We will repeat labs and phone visit in 4  months - We will defer management of hemorrhoids to gastroenterology and surgery.   PLAN SUMMARY & DISPOSITION: Repeat labs and RTC in 4 months  All questions were answered. The patient knows to call the clinic with any problems, questions or concerns.  Medical decision making: Low  Time spent on visit: I spent 15 minutes counseling the patient face to face. The total time spent in the appointment was 20 minutes and more than 50% was on counseling.   Harriett Rush, PA-C  03/23/2021 11:41 AM

## 2021-03-23 ENCOUNTER — Inpatient Hospital Stay (HOSPITAL_COMMUNITY): Payer: PPO | Attending: Hematology and Oncology | Admitting: Physician Assistant

## 2021-03-23 ENCOUNTER — Other Ambulatory Visit: Payer: Self-pay

## 2021-03-23 VITALS — BP 126/81 | HR 86 | Temp 98.2°F | Resp 19 | Ht 61.0 in | Wt 150.2 lb

## 2021-03-23 DIAGNOSIS — Z87891 Personal history of nicotine dependence: Secondary | ICD-10-CM | POA: Diagnosis not present

## 2021-03-23 DIAGNOSIS — Z800 Family history of malignant neoplasm of digestive organs: Secondary | ICD-10-CM | POA: Diagnosis not present

## 2021-03-23 DIAGNOSIS — D500 Iron deficiency anemia secondary to blood loss (chronic): Secondary | ICD-10-CM | POA: Insufficient documentation

## 2021-03-23 DIAGNOSIS — K649 Unspecified hemorrhoids: Secondary | ICD-10-CM | POA: Diagnosis not present

## 2021-03-23 NOTE — Patient Instructions (Addendum)
[place] at [facility] Discharge Instructions  You were seen today by Tarri Abernethy PA-C for your iron deficiency anemia.  Your blood and iron levels looked great at today's visit!  You do not need any IV iron at this time.  LABS: Return in 4 months for repeat labs  OTHER TESTS: No other tests at this time  MEDICATIONS: No changes to previously prescribed home medications.  FOLLOW-UP APPOINTMENT: Office visit in 4 months, after labs   Thank you for choosing [place] at [facility] to provide your oncology and hematology care.  To afford each patient quality time with our provider, please arrive at least 15 minutes before your scheduled appointment time.   If you have a lab appointment with the [facility] please come in thru the Main Entrance and check in at the main information desk.  You need to re-schedule your appointment should you arrive 10 or more minutes late.  We strive to give you quality time with our providers, and arriving late affects you and other patients whose appointments are after yours.  Also, if you no show three or more times for appointments you may be dismissed from the clinic at the providers discretion.     Again, thank you for choosing [facility].  Our hope is that these requests will decrease the amount of time that you wait before being seen by our physicians.       _____________________________________________________________  Should you have questions after your visit to [facility], please contact our office at [phone] and follow the prompts.  Our office hours are 8:00 a.m. and 4:30 p.m. Monday - Friday.  Please note that voicemails left after 4:00 p.m. may not be returned until the following business day.  We are closed weekends and major holidays.  You do have access to a nurse 24-7, just call the main number to the clinic [phone] and do not press any options, hold  on the line and a nurse will answer the phone.    For prescription refill requests, have your pharmacy contact our office and allow 72 hours.    Due to Covid, you will need to wear a mask upon entering the hospital. If you do not have a mask, a mask will be given to you at the Main Entrance upon arrival. For doctor visits, patients may have 1 support person age 85 or older with them. For treatment visits, patients can not have anyone with them due to social distancing guidelines and our immunocompromised population.

## 2021-03-31 DIAGNOSIS — Z20828 Contact with and (suspected) exposure to other viral communicable diseases: Secondary | ICD-10-CM | POA: Diagnosis not present

## 2021-03-31 DIAGNOSIS — J209 Acute bronchitis, unspecified: Secondary | ICD-10-CM | POA: Diagnosis not present

## 2021-04-15 ENCOUNTER — Ambulatory Visit: Payer: PPO | Admitting: Cardiology

## 2021-04-18 NOTE — Progress Notes (Deleted)
Referring Provider: Rosalee Kaufman, * Primary Care Physician:  Rosalee Kaufman, PA-C Primary GI Physician: Dr. Gala Romney  No chief complaint on file.   HPI:   [name] is a 88 y.o. female presenting today for follow-up of IDA, rectal bleeding.   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   Last seen in our office 10/29/2020.  [name] was already planned for 7/27 for evaluation of IDA.  She had stopped iron back in June.  Denied melena, but was having rectal bleeding with every bowel movement, using hydrocortisone cream twice daily without much improvement. GERD was well controlled on Protonix twice daily.  She was still taking Entocort 3 mg daily.  Bowels moving well, diarrhea resolved.  Continued with RLQ abdominal pain, but reported it was worse now than it had ever been and felt a knot in the area. Pain was constant, barely able to walk due to significant worsening of pain with movement.  No association with bowel habits or meals.  She had recently been treated for UTI and denied any urinary symptoms.  She had fallen back in April.  Also with chronic back pain and noted her pain radiated into her groin.  Previously evaluated by CT in April with no acute findings and suspected multifactorial etiology in the setting of UTI, constipation, and MSK.  Also had CT of A/P without contrast on file in June with no acute process.  On exam, I noted subcutaneous nodularity though did not feel like a typical hernia.  Recommended CT for further evaluation.  If unremarkable, she would need to follow-up with PCP for possible  MSK/neurologic etiology.  Plan to discuss possible hemorrhoid banding with Roseanne Kaufman, request blood work from PCP, resume oral iron, discontinue Entocort, continue PPI twice daily, proceed with Givens capsule as scheduled.  CT A/P with contrast 10/29/2020 with no acute findings.  No hernias noted.  Recommend follow-up with PCP.  Per Roseanne Kaufman, unable to complete hemorrhoid banding at our office due to anticoagulation and known external and internal hemorrhoids.  Recommended referral to general surgery.    CBC 7/21 with hemoglobin of 9.3, down to 7.9 on 7/29.  Eliquis was placed on hold for a couple of days and bleeding slacked off, hemoglobin began to slowly improve. She was ultimately referred to hematology as she was not able to tolerate oral iron due to worsening constipation.   Givens capsule 11/10/20 with several nonbleeding erosions noted at pylorus and proximal small bowel with significance unclear, but could contribute to occult GI bleeding in the setting of anticoagulation.  Several lymphangiectasia is noted in distal small bowel.  No masses.  Recommended avoiding NSAIDs and ongoing monitoring of H&H.  Office visit with Dr. Arnoldo Morale on 8/11.  Recommended against surgical management of hemorrhoids in light of multiple comorbidities as rectal bleeding had resolved.  She was advised to resume Eliquis and monitor for return of bleeding.  Established with hematology 8/23.  She was scheduled for IV iron q. 7 days x 2 doses.  Most recent labs completed 03/16/2021 with hemoglobin improved to 12.9, ferritin 178, iron 61, saturation 19%.  Today:    Past Medical History:  Diagnosis Date   Anxiety  Arthritis    Atrial fibrillation (HCC)    Bursitis    Left shoulder   Cataract    CHF (congestive heart failure) (HCC)    CKD (chronic kidney disease)    stage 3-4   COPD (chronic obstructive pulmonary disease) (HCC)    Coronary atherosclerosis of native coronary artery    a. s/p DES to LCx  in 04/2013 b. cath in 11/2015 showing patent stent with 20% prox-LAD and 80% ostial RCA stenosis for which medical management was recommended due to small artery size   Depression    Diastolic heart failure (HCC)    EF 55-60%   Dysphagia, unspecified(787.20)    Dyspnea    Dysrhythmia    Essential hypertension    GERD (gastroesophageal reflux disease)    Hx Schatzki's ring, multiple EGD/ED last 01/06/2004   Gout    Headache    History of anemia    Hyperlipidemia    Internal hemorrhoids without mention of complication    MI (myocardial infarction) ([place]) 2006   Microscopic colitis 2003   Panic disorder without agoraphobia    Paresthesia    Pneumonia 12/2011   PVD (peripheral vascular disease) (HCC)    S/P colonoscopy 09/27/2001   internal hemorrhoids, desc colon inflam polyp, SB BX-chronic duodenitis, colitis   Sleep apnea    Thyroid disease     Past Surgical History:  Procedure Laterality Date   ABDOMINAL HYSTERECTOMY     ABDOMINAL HYSTERECTOMY     AGILE CAPSULE N/A 08/30/2020   Procedure: AGILE CAPSULE;  Surgeon: Daneil Dolin, MD;  [facility];  Service: Endoscopy;  Laterality: N/A;  7:30am   ANTERIOR AND POSTERIOR REPAIR     with resection of vagina   ANTERIOR LAT LUMBAR FUSION N/A 08/01/2016   Procedure: Lumbar Two-Lumbar Five Transpsoas lateral interbody fusion with Lumbar Two-Three lateral plate fixation;  Surgeon: Kevan Ny Ditty, MD;  Location: [facility];  Service: Neurosurgery;  Laterality: N/A;  L2-5 Transpsoas lateral interbody fusion with L2-3 lateral plate fixation   APPENDECTOMY     BACK SURGERY     BIOPSY  07/05/2015   Procedure: BIOPSY;  Surgeon: Daneil Dolin, MD;  [facility];  Service: Endoscopy;;  gastric polyp biopsy, ascending colon biopsy   BIOPSY  02/16/2020   Procedure: BIOPSY;  Surgeon: Daneil Dolin, MD;  [facility];  Service: Endoscopy;;   BIOPSY  06/28/2020   Procedure: BIOPSY;  Surgeon: Daneil Dolin, MD;   [facility];  Service: Endoscopy;;   BLADDER SUSPENSION  11/09/2011   Procedure: TRANSVAGINAL TAPE (TVT) PROCEDURE;  Surgeon: Marissa Nestle, MD;  [facility]: [facility];  Service: Urology;  Laterality: N/A;   bladder tack  06/2010   BREAST LUMPECTOMY  1998   left, benign   CARDIAC CATHETERIZATION     CARDIAC CATHETERIZATION     CARDIAC CATHETERIZATION N/A 12/16/2015   Procedure: Left Heart Cath and Coronary Angiography;  Surgeon: Troy Sine, MD;  Location: [facility];  Service: Cardiovascular;  Laterality: N/A;   CARDIOVERSION N/A 10/04/2017   Procedure: CARDIOVERSION;  Surgeon: Herminio Commons, MD;  [facility]: [facility];  Service: Cardiovascular;  Laterality: N/A;   CARDIOVERSION N/A 01/30/2018   Procedure: CARDIOVERSION;  Surgeon: Herminio Commons, MD;  [facility];  Service: Cardiovascular;  Laterality: N/A;   CARDIOVERSION N/A 11/10/2019   Procedure: CARDIOVERSION;  Surgeon: Josue Hector, MD;  Location: [facility];  Service: Cardiovascular;  Laterality: N/A;   CARPAL TUNNEL RELEASE  1989   left   cataract surgery     CHOLECYSTECTOMY  1998   Cholecystectomy     COLONOSCOPY  03/16/2011   multiple hyperplastic colon polyps, sigmoid diverticulosis, melanosis coli   COLONOSCOPY WITH PROPOFOL N/A 07/05/2015   RMR:one 5 mm polyp in descending colon   COLONOSCOPY WITH PROPOFOL N/A 06/28/2020   Procedure: COLONOSCOPY WITH PROPOFOL;  Surgeon: Daneil Dolin, MD;  [facility];  Service: Endoscopy;  Laterality: N/A;  am appt   CORONARY ANGIOGRAPHY N/A 05/16/2018   Procedure: CORONARY ANGIOGRAPHY (CATH LAB);  Surgeon: Belva Crome, MD;  Location: [facility];  Service: Cardiovascular;  Laterality: N/A;   CORONARY ANGIOPLASTY WITH STENT PLACEMENT  2015   ESOPHAGEAL DILATION N/A 07/05/2015   Procedure: ESOPHAGEAL DILATION;  Surgeon: Daneil Dolin, MD;  [facility];  Service: Endoscopy;  Laterality: N/A;    ESOPHAGOGASTRODUODENOSCOPY (EGD) WITH PROPOFOL N/A 07/05/2015   [id number]   ESOPHAGOGASTRODUODENOSCOPY (EGD) WITH PROPOFOL N/A 02/16/2020   Procedure: ESOPHAGOGASTRODUODENOSCOPY (EGD) WITH PROPOFOL;  Surgeon: Daneil Dolin, MD;  Normal examined esophagus s/p dilation, erythematous mucosa in the stomach s/p biopsy, normal examined duodenum. Pathology with mild gastropathy, negative for H. Pylori.    GIVENS CAPSULE STUDY N/A 11/10/2020   Procedure: GIVENS CAPSULE STUDY;  Surgeon: Daneil Dolin, MD;  [facility];  Service: Endoscopy;  Laterality: N/A;  7:30am   JOINT REPLACEMENT Right 2007   right knee   left hand surgery     LEFT HEART CATHETERIZATION WITH CORONARY ANGIOGRAM N/A 05/14/2013   Procedure: LEFT HEART CATHETERIZATION WITH CORONARY ANGIOGRAM;  Surgeon: Blane Ohara, MD;  Location: [facility];  Service: Cardiovascular;  Laterality: N/A;   left rotator cuff surgery     LUMBAR LAMINECTOMY/DECOMPRESSION MICRODISCECTOMY N/A 10/11/2012   Procedure: LUMBAR LAMINECTOMY/DECOMPRESSION MICRODISCECTOMY 2 LEVELS;  Surgeon: Floyce Stakes, MD;  Location: [facility];  Service: Neurosurgery;  Laterality: N/A;  L3-4 L4-5 Laminectomy   LUMBAR WOUND DEBRIDEMENT N/A 09/27/2015   Procedure: Exploration of Lumbar Wound w/ Repair CSF Leak/Lumbar Drain Placement;  Surgeon: Leeroy Cha, MD;  Location: [facility];  Service: Neurosurgery;  Laterality: N/A;   MALONEY DILATION  03/16/2011   Gastritis. No H.pylori on bx. 84F maloney dilation with disruption of  occult cevical esophageal web   MALONEY DILATION N/A 02/16/2020   Procedure: MALONEY DILATION;  Surgeon: Daneil Dolin, MD;  [facility];  Service: Endoscopy;  Laterality: N/A;   NASAL SINUS SURGERY     right knee replacement  2007   right leg benign tumor     SHOULDER SURGERY Left    TEE WITHOUT CARDIOVERSION N/A 10/04/2017   Procedure: TRANSESOPHAGEAL ECHOCARDIOGRAM (TEE) WITH PROPOFOL;  Surgeon: Herminio Commons, MD;  [facility];  Service: Cardiovascular;  Laterality: N/A;   TEE WITHOUT CARDIOVERSION N/A 11/10/2019   Procedure: TRANSESOPHAGEAL ECHOCARDIOGRAM (TEE);  Surgeon: Josue Hector, MD;  Location: [facility];  Service: Cardiovascular;  Laterality: N/A;   TONSILLECTOMY     unspecified area, hysterectomy  1972   partial    Current Outpatient Medications  Medication Sig Dispense Refill   acetaminophen (TYLENOL) 500 MG tablet Take 500 mg by mouth every 6 (six) hours as needed for headache or moderate pain. (Patient not taking: Reported on 03/23/2021)     ALPRAZolam (XANAX) 0.5 MG tablet Take 0.5 mg by mouth daily as needed for anxiety or sleep. (  Patient not taking: Reported on 03/23/2021)     apixaban (ELIQUIS) 5 MG TABS tablet Take 1 tablet (5 mg total) by mouth 2 (two) times daily. 180 tablet 3   ascorbic acid (VITAMIN C) 500 MG tablet Take 1 tablet (500 mg total) by mouth daily. 30 tablet 2   diltiazem (CARDIZEM CD) 120 MG 24 hr capsule TAKE ONE CAPSULE BY MOUTH DAILY 90 capsule 3   DULoxetine (CYMBALTA) 60 MG capsule Take 60 mg by mouth at bedtime.     ferrous sulfate 325 (65 FE) MG tablet Take 1 tablet (325 mg total) by mouth daily with breakfast. 30 tablet 3   fluticasone (CUTIVATE) 0.05 % cream Apply 1 application topically 2 (two) times daily as needed (skin tears). (Patient not taking: Reported on 03/23/2021)     furosemide (LASIX) 40 MG tablet Take 1 tablet (40 mg total) by mouth daily.     hydrocortisone (ANUSOL-HC) 2.5 % rectal cream Place 1 application rectally 2 (two) times daily. 30 g 1   isosorbide mononitrate (IMDUR) 60 MG 24 hr tablet Take 60 mg by mouth daily.     magnesium oxide (MAG-OX) 400 MG tablet TAKE ONE TABLET BY MOUTH DAILY 90 tablet 3   metoprolol succinate (TOPROL-XL) 25 MG 24 hr tablet Take 75 mg by mouth daily.     Multiple Vitamins-Minerals (HAIR/SKIN/NAILS) CAPS Take 1 tablet by mouth daily.     Naphazoline HCl (CLEAR EYES OP) Place 1 drop into both eyes  daily as needed (itching).     nitroGLYCERIN (NITROSTAT) 0.4 MG SL tablet DISSOLVE ONE TABLET UNDER TONGUE EVERY 5 MINUTES UP TO 3 DOSES AS NEEDED FOR CHEST PAIN. (Patient not taking: Reported on 03/23/2021) 25 tablet 6   pantoprazole (PROTONIX) 40 MG tablet TAKE ONE TABLET BY MOUTH TWICE DAILY BEFORE MEALS 60 tablet 5   potassium chloride (KLOR-CON) 10 MEQ tablet TAKE ONE TABLET BY MOUTH DAILY, 90 tablet 1   sulfamethoxazole-trimethoprim (BACTRIM DS) 800-160 MG tablet Take 1 tablet by mouth 2 (two) times daily.     traMADol (ULTRAM) 50 MG tablet Take 1 tablet (50 mg total) by mouth every 6 (six) hours as needed. (Patient not taking: Reported on 03/23/2021) 90 tablet 3   triamcinolone cream (KENALOG) 0.1 % Apply topically 2 (two) times daily.     zinc sulfate 220 (50 Zn) MG capsule Take 1 capsule (220 mg total) by mouth daily. 30 capsule 3   No current facility-administered medications for this visit.    Allergies as of 04/20/2021 - Review Complete 03/23/2021  Allergen Reaction Noted   Cephalosporins Diarrhea and Nausea Only 08/04/2015   Levaquin [levofloxacin in d5w] Swelling 07/06/2014   Macrodantin [nitrofurantoin macrocrystal] Swelling 07/06/2014   Phenothiazines Anaphylaxis and Hives 08/16/2011   Polysorbate Anaphylaxis 08/16/2011   Prednisone Shortness Of Breath    Buspirone Itching 07/06/2014   Cardura [doxazosin mesylate] Itching 07/06/2014   Codeine Itching    Acyclovir and related Itching 10/07/2015   Colcrys [colchicine] Nausea Only 10/04/2018   Prochlorperazine Other (See Comments) 08/16/2011   Ranexa [ranolazine]  04/06/2015   Atorvastatin Hives 08/16/2011   Colestipol Palpitations 05/03/2020   Ofloxacin Rash    Other Itching and Rash 10/10/2012   Penicillins Other (See Comments) 11/09/2011   Pimozide Hives and Itching 08/16/2011    Family History  Problem Relation Age of Onset   Stroke Mother    Parkinson's disease Father    Coronary artery disease Other         family Hx-sons  Cancer Other    Stroke Other        family Hx   Hypertension Other        family Hx   Diabetes Brother    Heart disease Son        before age 53   Diabetes Son    Stroke Daughter 26   Colon cancer Grandson        diagnosed 26   Inflammatory bowel disease Neg Hx     Social History   Socioeconomic History   Marital status: Divorced    Spouse name: Not on file   Number of children: 5   Years of education: Not on file   Highest education level: Not on file  Occupational History   Occupation: retired  Tobacco Use   Smoking status: Former    Packs/day: 1.00    Years: 64.00    Pack years: 64.00    Types: Cigarettes    Start date: 12/24/1947    Quit date: 11/17/2001    Years since quitting: 19.4   Smokeless tobacco: Never   Tobacco comments:    Quit smoking in 2003  Vaping Use   Vaping Use: Never used  Substance and Sexual Activity   Alcohol use: Not Currently   Drug use: No   Sexual activity: Never  Other Topics Concern   Not on file  Social History Narrative   Divorced.   Sister had colon perforation & died from complications in [place], [place]   Social Determinants of Health   Financial Resource Strain: Not on file  Food Insecurity: Not on file  Transportation Needs: Not on file  Physical Activity: Not on file  Stress: Not on file  Social Connections: Not on file    Review of Systems: Gen: Denies fever, chills, anorexia. Denies fatigue, weakness, weight loss.  CV: Denies chest pain, palpitations, syncope, peripheral edema, and claudication. Resp: Denies dyspnea at rest, cough, wheezing, coughing up blood, and pleurisy. GI: Denies vomiting blood, jaundice, and fecal incontinence.   Denies dysphagia or odynophagia. Derm: Denies rash, itching, dry skin Psych: Denies depression, anxiety, memory loss, confusion. No homicidal or suicidal ideation.  Heme: Denies bruising, bleeding, and enlarged lymph nodes.  Physical Exam: There were no vitals taken for  this visit. General:   Alert and oriented. No distress noted. Pleasant and cooperative.  Head:  Normocephalic and atraumatic. Eyes:  Conjuctiva clear without scleral icterus. Mouth:  Oral mucosa pink and moist. Good dentition. No lesions. Heart:  S1, S2 present without murmurs appreciated. Lungs:  Clear to auscultation bilaterally. No wheezes, rales, or rhonchi. No distress.  Abdomen:  +BS, soft, non-tender and non-distended. No rebound or guarding. No HSM or masses noted. Msk:  Symmetrical without gross deformities. Normal posture. Extremities:  Without edema. Neurologic:  Alert and  oriented x4 Psych:  Alert and cooperative. Normal mood and affect.

## 2021-04-20 ENCOUNTER — Ambulatory Visit: Payer: PPO | Admitting: Gastroenterology

## 2021-05-09 ENCOUNTER — Other Ambulatory Visit: Payer: Self-pay | Admitting: Gastroenterology

## 2021-05-09 DIAGNOSIS — K219 Gastro-esophageal reflux disease without esophagitis: Secondary | ICD-10-CM

## 2021-05-17 ENCOUNTER — Telehealth: Payer: Self-pay | Admitting: *Deleted

## 2021-05-17 DIAGNOSIS — I4891 Unspecified atrial fibrillation: Secondary | ICD-10-CM | POA: Diagnosis not present

## 2021-05-17 DIAGNOSIS — Z7901 Long term (current) use of anticoagulants: Secondary | ICD-10-CM | POA: Diagnosis not present

## 2021-05-17 DIAGNOSIS — I5032 Chronic diastolic (congestive) heart failure: Secondary | ICD-10-CM | POA: Diagnosis not present

## 2021-05-17 DIAGNOSIS — J449 Chronic obstructive pulmonary disease, unspecified: Secondary | ICD-10-CM | POA: Diagnosis not present

## 2021-05-17 DIAGNOSIS — L0390 Cellulitis, unspecified: Secondary | ICD-10-CM | POA: Diagnosis not present

## 2021-05-17 DIAGNOSIS — I25118 Atherosclerotic heart disease of native coronary artery with other forms of angina pectoris: Secondary | ICD-10-CM | POA: Diagnosis not present

## 2021-05-17 DIAGNOSIS — I480 Paroxysmal atrial fibrillation: Secondary | ICD-10-CM | POA: Diagnosis not present

## 2021-05-17 DIAGNOSIS — Z955 Presence of coronary angioplasty implant and graft: Secondary | ICD-10-CM | POA: Diagnosis not present

## 2021-05-17 DIAGNOSIS — D649 Anemia, unspecified: Secondary | ICD-10-CM | POA: Diagnosis not present

## 2021-05-17 DIAGNOSIS — D696 Thrombocytopenia, unspecified: Secondary | ICD-10-CM | POA: Diagnosis not present

## 2021-05-17 DIAGNOSIS — D6869 Other thrombophilia: Secondary | ICD-10-CM | POA: Diagnosis not present

## 2021-05-17 DIAGNOSIS — F330 Major depressive disorder, recurrent, mild: Secondary | ICD-10-CM | POA: Diagnosis not present

## 2021-05-17 DIAGNOSIS — Z6828 Body mass index (BMI) 28.0-28.9, adult: Secondary | ICD-10-CM | POA: Diagnosis not present

## 2021-05-17 DIAGNOSIS — M109 Gout, unspecified: Secondary | ICD-10-CM | POA: Diagnosis not present

## 2021-05-17 NOTE — Telephone Encounter (Signed)
NP Corene Cornea out seeing patient for gout flare up and wants to prescribe Colchicine 1.2 mg once, then 0.6 daily for 3-4 days. Please advise if okay to take with diltiazem

## 2021-05-17 NOTE — Telephone Encounter (Signed)
Corene Cornea NP informed and verbalized understanding of plan.

## 2021-05-17 NOTE — Telephone Encounter (Signed)
Diltiazem increases concentrations of colchicine, especially in pts with renal dysfunction. Use together is generally not recommended. If colchicine is prescribed, pt can take the initial 1.2mg  dose but cannot repeat dose within next 3 days due to drug interaction with diltiazem.

## 2021-05-20 DIAGNOSIS — M109 Gout, unspecified: Secondary | ICD-10-CM | POA: Diagnosis not present

## 2021-05-23 DIAGNOSIS — Z09 Encounter for follow-up examination after completed treatment for conditions other than malignant neoplasm: Secondary | ICD-10-CM | POA: Diagnosis not present

## 2021-05-23 DIAGNOSIS — Z8739 Personal history of other diseases of the musculoskeletal system and connective tissue: Secondary | ICD-10-CM | POA: Diagnosis not present

## 2021-06-01 ENCOUNTER — Other Ambulatory Visit: Payer: Self-pay

## 2021-06-01 ENCOUNTER — Ambulatory Visit: Payer: PPO | Admitting: Internal Medicine

## 2021-06-01 ENCOUNTER — Encounter: Payer: Self-pay | Admitting: Internal Medicine

## 2021-06-01 VITALS — BP 124/66 | HR 79 | Ht 61.0 in | Wt 156.4 lb

## 2021-06-01 DIAGNOSIS — I4891 Unspecified atrial fibrillation: Secondary | ICD-10-CM

## 2021-06-01 DIAGNOSIS — I5032 Chronic diastolic (congestive) heart failure: Secondary | ICD-10-CM

## 2021-06-01 NOTE — Progress Notes (Signed)
HPI Mrs. Earnhart returns today for followup. She is a pleasant 88 yo woman with PAF, NSVT, HTN, and chronic anti-coagulation. When I saw her last she had developed pain in her left foot and I was concerned about atheroemboli but by the time she saw Dr. Donnetta Hutching her toes had cleared up. No trauma. She has been on an [facility] for years.  She denies palpitations. She admits to occaisional non-compliance. Allergies  Allergen Reactions   Cephalosporins Diarrhea and Nausea Only    Lightheaded   Levaquin [Levofloxacin In D5w] Swelling   Macrodantin [Nitrofurantoin Macrocrystal] Swelling   Phenothiazines Anaphylaxis and Hives   Polysorbate Anaphylaxis   Prednisone Shortness Of Breath   Buspirone Itching   Cardura [Doxazosin Mesylate] Itching   Codeine Itching   Acyclovir And Related Itching    Redness of skin   Colcrys [Colchicine] Nausea Only    Upset stomach    Prochlorperazine Other (See Comments)    "Upset stomach"   Ranexa [Ranolazine]     Severe drop in BP   Atorvastatin Hives    Cramping; tolerates Crestor ok   Colestipol Palpitations   Ofloxacin Rash   Other Itching and Rash    "WOOL"= make skin look like it has been burned   Penicillins Other (See Comments)    Causes redness all over. Has patient had a PCN reaction causing immediate rash, facial/tongue/throat swelling, SOB or lightheadedness with hypotension: No Has patient had a PCN reaction causing severe rash involving mucus membranes or skin necrosis: No Has patient had a PCN reaction that required hospitalization No Has patient had a PCN reaction occurring within the last 10 years: No If all of the above answers are "NO", then may proceed with Cephalosporin use.    Pimozide Hives and Itching     Current Outpatient Medications  Medication Sig Dispense Refill   acetaminophen (TYLENOL) 500 MG tablet Take 500 mg by mouth every 6 (six) hours as needed for headache or moderate pain.     ALPRAZolam (XANAX) 0.5 MG tablet  Take 0.5 mg by mouth daily as needed for anxiety or sleep.     apixaban (ELIQUIS) 5 MG TABS tablet Take 1 tablet (5 mg total) by mouth 2 (two) times daily. 180 tablet 3   ascorbic acid (VITAMIN C) 500 MG tablet Take 1 tablet (500 mg total) by mouth daily. 30 tablet 2   dexamethasone (DECADRON) 4 MG tablet Take 8 mg by mouth daily.     diltiazem (CARDIZEM CD) 120 MG 24 hr capsule TAKE ONE CAPSULE BY MOUTH DAILY 90 capsule 3   doxycycline (VIBRA-TABS) 100 MG tablet Take 100 mg by mouth 2 (two) times daily.     DULoxetine (CYMBALTA) 60 MG capsule Take 60 mg by mouth at bedtime.     ferrous sulfate 325 (65 FE) MG tablet Take 1 tablet (325 mg total) by mouth daily with breakfast. 30 tablet 3   fluticasone (CUTIVATE) 0.05 % cream Apply 1 application topically 2 (two) times daily as needed (skin tears).     furosemide (LASIX) 40 MG tablet Take 1 tablet (40 mg total) by mouth daily.     hydrocortisone (ANUSOL-HC) 2.5 % rectal cream Place 1 application rectally 2 (two) times daily. 30 g 1   isosorbide mononitrate (IMDUR) 60 MG 24 hr tablet Take 60 mg by mouth daily.     magnesium oxide (MAG-OX) 400 MG tablet TAKE ONE TABLET BY MOUTH DAILY 90 tablet 3   metoprolol succinate (TOPROL-XL)  25 MG 24 hr tablet Take 75 mg by mouth daily.     Multiple Vitamins-Minerals (HAIR/SKIN/NAILS) CAPS Take 1 tablet by mouth daily.     Naphazoline HCl (CLEAR EYES OP) Place 1 drop into both eyes daily as needed (itching).     nitroGLYCERIN (NITROSTAT) 0.4 MG SL tablet DISSOLVE ONE TABLET UNDER TONGUE EVERY 5 MINUTES UP TO 3 DOSES AS NEEDED FOR CHEST PAIN. 25 tablet 6   pantoprazole (PROTONIX) 40 MG tablet TAKE ONE TABLET BY MOUTH TWICE DAILY BEFORE MEALS 60 tablet 5   potassium chloride (KLOR-CON) 10 MEQ tablet TAKE ONE TABLET BY MOUTH DAILY, 90 tablet 1   traMADol (ULTRAM) 50 MG tablet Take 1 tablet (50 mg total) by mouth every 6 (six) hours as needed. 90 tablet 3   triamcinolone cream (KENALOG) 0.1 % Apply topically 2 (two)  times daily.     zinc sulfate 220 (50 Zn) MG capsule Take 1 capsule (220 mg total) by mouth daily. 30 capsule 3   No current facility-administered medications for this visit.     Past Medical History:  Diagnosis Date   Anxiety    Arthritis    Atrial fibrillation (HCC)    Bursitis    Left shoulder   Cataract    CHF (congestive heart failure) (HCC)    CKD (chronic kidney disease)    stage 3-4   COPD (chronic obstructive pulmonary disease) (HCC)    Coronary atherosclerosis of native coronary artery    a. s/p DES to LCx in 04/2013 b. cath in 11/2015 showing patent stent with 20% prox-LAD and 80% ostial RCA stenosis for which medical management was recommended due to small artery size   Depression    Diastolic heart failure (HCC)    EF 55-60%   Dysphagia, unspecified(787.20)    Dyspnea    Dysrhythmia    Essential hypertension    GERD (gastroesophageal reflux disease)    Hx Schatzki's ring, multiple EGD/ED last 01/06/2004   Gout    Headache    History of anemia    Hyperlipidemia    Internal hemorrhoids without mention of complication    MI (myocardial infarction) ([place]) 2006   Microscopic colitis 2003   Panic disorder without agoraphobia    Paresthesia    Pneumonia 12/2011   PVD (peripheral vascular disease) (HCC)    S/P colonoscopy 09/27/2001   internal hemorrhoids, desc colon inflam polyp, SB BX-chronic duodenitis, colitis   Sleep apnea    Thyroid disease     ROS:   All systems reviewed and negative except as noted in the HPI.   Past Surgical History:  Procedure Laterality Date   ABDOMINAL HYSTERECTOMY     ABDOMINAL HYSTERECTOMY     AGILE CAPSULE N/A 08/30/2020   Procedure: AGILE CAPSULE;  Surgeon: Daneil Dolin, MD;  [facility];  Service: Endoscopy;  Laterality: N/A;  7:30am   ANTERIOR AND POSTERIOR REPAIR     with resection of vagina   ANTERIOR LAT LUMBAR FUSION N/A 08/01/2016   Procedure: Lumbar Two-Lumbar Five Transpsoas lateral interbody fusion  with Lumbar Two-Three lateral plate fixation;  Surgeon: Kevan Ny Ditty, MD;  Location: [place];  Service: Neurosurgery;  Laterality: N/A;  L2-5 Transpsoas lateral interbody fusion with L2-3 lateral plate fixation   APPENDECTOMY     BACK SURGERY     BIOPSY  07/05/2015   Procedure: BIOPSY;  Surgeon: Daneil Dolin, MD;  [facility];  Service: Endoscopy;;  gastric polyp biopsy, ascending colon  biopsy   BIOPSY  02/16/2020   Procedure: BIOPSY;  Surgeon: Daneil Dolin, MD;  [facility];  Service: Endoscopy;;   BIOPSY  06/28/2020   Procedure: BIOPSY;  Surgeon: Daneil Dolin, MD;  [facility];  Service: Endoscopy;;   BLADDER SUSPENSION  11/09/2011   Procedure: TRANSVAGINAL TAPE (TVT) PROCEDURE;  Surgeon: Marissa Nestle, MD;  [facility];  Service: Urology;  Laterality: N/A;   bladder tack  06/2010   BREAST LUMPECTOMY  1998   left, benign   CARDIAC CATHETERIZATION     CARDIAC CATHETERIZATION     CARDIAC CATHETERIZATION N/A 12/16/2015   Procedure: Left Heart Cath and Coronary Angiography;  Surgeon: Troy Sine, MD;  Location: [place] [facility];  Service: Cardiovascular;  Laterality: N/A;   CARDIOVERSION N/A 10/04/2017   Procedure: CARDIOVERSION;  Surgeon: Herminio Commons, MD;  [facility];  Service: Cardiovascular;  Laterality: N/A;   CARDIOVERSION N/A 01/30/2018   Procedure: CARDIOVERSION;  Surgeon: Herminio Commons, MD;  [facility];  Service: Cardiovascular;  Laterality: N/A;   CARDIOVERSION N/A 11/10/2019   Procedure: CARDIOVERSION;  Surgeon: Josue Hector, MD;  Location: [facility] ENDOSCOPY;  Service: Cardiovascular;  Laterality: N/A;   [place]   left   cataract surgery     CHOLECYSTECTOMY  1998   Cholecystectomy     COLONOSCOPY  03/16/2011   multiple hyperplastic colon polyps, sigmoid diverticulosis, melanosis coli   COLONOSCOPY WITH PROPOFOL N/A 07/05/2015   RMR:one 5 mm polyp in descending colon    COLONOSCOPY WITH PROPOFOL N/A 06/28/2020   Procedure: COLONOSCOPY WITH PROPOFOL;  Surgeon: Daneil Dolin, MD;  [facility];  Service: Endoscopy;  Laterality: N/A;  am appt   CORONARY ANGIOGRAPHY N/A 05/16/2018   Procedure: CORONARY ANGIOGRAPHY (CATH LAB);  Surgeon: Belva Crome, MD;  Location: [place] [facility];  Service: Cardiovascular;  Laterality: N/A;   CORONARY ANGIOPLASTY WITH STENT PLACEMENT  2015   ESOPHAGEAL DILATION N/A 07/05/2015   Procedure: ESOPHAGEAL DILATION;  Surgeon: Daneil Dolin, MD;  [facility];  Service: Endoscopy;  Laterality: N/A;   ESOPHAGOGASTRODUODENOSCOPY (EGD) WITH PROPOFOL N/A 07/05/2015   [id number]   ESOPHAGOGASTRODUODENOSCOPY (EGD) WITH PROPOFOL N/A 02/16/2020   Procedure: ESOPHAGOGASTRODUODENOSCOPY (EGD) WITH PROPOFOL;  Surgeon: Daneil Dolin, MD;  Normal examined esophagus s/p dilation, erythematous mucosa in the stomach s/p biopsy, normal examined duodenum. Pathology with mild gastropathy, negative for H. Pylori.    GIVENS CAPSULE STUDY N/A 11/10/2020   Procedure: GIVENS CAPSULE STUDY;  Surgeon: Daneil Dolin, MD;  [facility];  Service: Endoscopy;  Laterality: N/A;  7:30am   JOINT REPLACEMENT Right 2007   right knee   left hand surgery     LEFT HEART CATHETERIZATION WITH CORONARY ANGIOGRAM N/A 05/14/2013   Procedure: LEFT HEART CATHETERIZATION WITH CORONARY ANGIOGRAM;  Surgeon: Blane Ohara, MD;  Location: [facility] CATH LAB;  Service: Cardiovascular;  Laterality: N/A;   left rotator cuff surgery     LUMBAR LAMINECTOMY/DECOMPRESSION MICRODISCECTOMY N/A 10/11/2012   Procedure: LUMBAR LAMINECTOMY/DECOMPRESSION MICRODISCECTOMY 2 LEVELS;  Surgeon: Floyce Stakes, MD;  Location: [place] [facility];  Service: Neurosurgery;  Laterality: N/A;  L3-4 L4-5 Laminectomy   LUMBAR WOUND DEBRIDEMENT N/A 09/27/2015   Procedure: Exploration of Lumbar Wound w/ Repair CSF Leak/Lumbar Drain Placement;  Surgeon: Leeroy Cha, MD;  Location: [place]  NEURO ORS;  Service: Neurosurgery;  Laterality: N/A;   MALONEY DILATION  03/16/2011   Gastritis. No H.pylori on bx. 20F maloney dilation with disruption of  occult cevical esophageal web   MALONEY DILATION N/A 02/16/2020   Procedure: MALONEY DILATION;  Surgeon: Daneil Dolin, MD;  [facility];  Service: Endoscopy;  Laterality: N/A;   NASAL SINUS SURGERY     right knee replacement  2007   right leg benign tumor     SHOULDER SURGERY Left    TEE WITHOUT CARDIOVERSION N/A 10/04/2017   Procedure: TRANSESOPHAGEAL ECHOCARDIOGRAM (TEE) WITH PROPOFOL;  Surgeon: Herminio Commons, MD;  [facility];  Service: Cardiovascular;  Laterality: N/A;   TEE WITHOUT CARDIOVERSION N/A 11/10/2019   Procedure: TRANSESOPHAGEAL ECHOCARDIOGRAM (TEE);  Surgeon: Josue Hector, MD;  Location: [facility] ENDOSCOPY;  Service: Cardiovascular;  Laterality: N/A;   TONSILLECTOMY     unspecified area, hysterectomy  1972   partial     Family History  Problem Relation Age of Onset   Stroke Mother    Parkinson's disease Father    Coronary artery disease Other        family Hx-sons   Cancer Other    Stroke Other        family Hx   Hypertension Other        family Hx   Diabetes Brother    Heart disease Son        before age 45   Diabetes Son    Stroke Daughter 25   Colon cancer Grandson        diagnosed 4   Inflammatory bowel disease Neg Hx      Social History   Socioeconomic History   Marital status: Divorced    Spouse name: Not on file   Number of children: 5   Years of education: Not on file   Highest education level: Not on file  Occupational History   Occupation: retired  Tobacco Use   Smoking status: Former    Packs/day: 1.00    Years: 64.00    Pack years: 64.00    Types: Cigarettes    Start date: 12/24/1947    Quit date: 11/17/2001    Years since quitting: 19.5   Smokeless tobacco: Never   Tobacco comments:    Quit smoking in 2003  Vaping Use   Vaping Use: Never used   Substance and Sexual Activity   Alcohol use: Not Currently   Drug use: No   Sexual activity: Never  Other Topics Concern   Not on file  Social History Narrative   Divorced.   Sister had colon perforation & died from complications in [place], [place]   Social Determinants of Health   Financial Resource Strain: Not on file  Food Insecurity: Not on file  Transportation Needs: Not on file  Physical Activity: Not on file  Stress: Not on file  Social Connections: Not on file  Intimate Partner Violence: Not on file     BP 124/66    Pulse 79    Ht 5\' 1"  (1.549 m)    Wt 156 lb 6.4 oz (70.9 kg)    SpO2 99%    BMI 29.55 kg/m   Physical Exam:  Well appearing NAD HEENT: Unremarkable Neck:  No JVD, no thyromegally Lymphatics:  No adenopathy Back:  No CVA tenderness Lungs:  Clear with no wheezes HEART:  Regular rate rhythm, no murmurs, no rubs, no clicks Abd:  soft, positive bowel sounds, no organomegally, no rebound, no guarding Ext:  2 plus pulses, no edema, no cyanosis,  no clubbing Skin:  No rashes no nodules Neuro:  CN II through XII intact, motor grossly intact   DEVICE  Normal device function.  See PaceArt for details.   Assess/Plan:  1. Chronic diastolic heart failure - she has improved and she will continue her lasix 2. NSVT - she is minimally symptomatic.  I would have her continue her toprol. 3. Gout - she has had a flare. I asked her to continue her current meds and try some cherry juice. 4. Foot swelling - her toes are much improved.      Carleene Overlie Ki Luckman,MD

## 2021-06-01 NOTE — Patient Instructions (Signed)
Medication Instructions:  Your physician recommends that you continue on your current medications as directed. Please refer to the Current Medication list given to you today.  *If you need a refill on your cardiac medications before your next appointment, please call your pharmacy*   Lab Work: NONE   If you have labs (blood work) drawn today and your tests are completely normal, you will receive your results only by: . MyChart Message (if you have MyChart) OR . A paper copy in the mail If you have any lab test that is abnormal or we need to change your treatment, we will call you to review the results.   Testing/Procedures: NONE    Follow-Up: At [facility], you and your health needs are our priority.  As part of our continuing mission to provide you with exceptional heart care, we have created designated Provider Care Teams.  These Care Teams include your primary Cardiologist (physician) and Advanced Practice Providers (APPs -  Physician Assistants and Nurse Practitioners) who all work together to provide you with the care you need, when you need it.  We recommend signing up for the patient portal called "MyChart".  Sign up information is provided on this After Visit Summary.  MyChart is used to connect with patients for Virtual Visits (Telemedicine).  Patients are able to view lab/test results, encounter notes, upcoming appointments, etc.  Non-urgent messages can be sent to your provider as well.   To learn more about what you can do with MyChart, go to [email or web address].    Your next appointment:   1 year(s)  The format for your next appointment:   In Person  Provider:   Gregg Taylor, MD   Other Instructions Thank you for choosing [facility]!    

## 2021-07-20 DIAGNOSIS — M109 Gout, unspecified: Secondary | ICD-10-CM | POA: Diagnosis not present

## 2021-07-20 DIAGNOSIS — Z6829 Body mass index (BMI) 29.0-29.9, adult: Secondary | ICD-10-CM | POA: Diagnosis not present

## 2021-07-21 ENCOUNTER — Inpatient Hospital Stay (HOSPITAL_COMMUNITY): Payer: PPO | Attending: Hematology

## 2021-07-21 DIAGNOSIS — D509 Iron deficiency anemia, unspecified: Secondary | ICD-10-CM | POA: Insufficient documentation

## 2021-07-21 DIAGNOSIS — D500 Iron deficiency anemia secondary to blood loss (chronic): Secondary | ICD-10-CM

## 2021-07-21 LAB — CBC WITH DIFFERENTIAL/PLATELET
Abs Immature Granulocytes: 0.07 10*3/uL (ref 0.00–0.07)
Basophils Absolute: 0 10*3/uL (ref 0.0–0.1)
Basophils Relative: 0 %
Eosinophils Absolute: 0 10*3/uL (ref 0.0–0.5)
Eosinophils Relative: 0 %
HCT: 38.4 % (ref 36.0–46.0)
Hemoglobin: 12.5 g/dL (ref 12.0–15.0)
Immature Granulocytes: 1 %
Lymphocytes Relative: 19 %
Lymphs Abs: 1.1 10*3/uL (ref 0.7–4.0)
MCH: 31.1 pg (ref 26.0–34.0)
MCHC: 32.6 g/dL (ref 30.0–36.0)
MCV: 95.5 fL (ref 80.0–100.0)
Monocytes Absolute: 0.1 10*3/uL (ref 0.1–1.0)
Monocytes Relative: 2 %
Neutro Abs: 4.5 10*3/uL (ref 1.7–7.7)
Neutrophils Relative %: 78 %
Platelets: 273 10*3/uL (ref 150–400)
RBC: 4.02 MIL/uL (ref 3.87–5.11)
RDW: 14.3 % (ref 11.5–15.5)
WBC: 5.7 10*3/uL (ref 4.0–10.5)
nRBC: 0 % (ref 0.0–0.2)

## 2021-07-21 LAB — IRON AND TIBC
Iron: 50 ug/dL (ref 28–170)
Saturation Ratios: 14 % (ref 10.4–31.8)
TIBC: 365 ug/dL (ref 250–450)
UIBC: 315 ug/dL

## 2021-07-21 LAB — FERRITIN: Ferritin: 97 ng/mL (ref 11–307)

## 2021-07-27 NOTE — Progress Notes (Deleted)
NO SHOW

## 2021-07-28 ENCOUNTER — Inpatient Hospital Stay (HOSPITAL_COMMUNITY): Payer: PPO | Admitting: Physician Assistant

## 2021-08-03 DIAGNOSIS — I10 Essential (primary) hypertension: Secondary | ICD-10-CM | POA: Diagnosis not present

## 2021-08-03 DIAGNOSIS — M109 Gout, unspecified: Secondary | ICD-10-CM | POA: Diagnosis not present

## 2021-08-03 DIAGNOSIS — K649 Unspecified hemorrhoids: Secondary | ICD-10-CM | POA: Diagnosis not present

## 2021-08-03 DIAGNOSIS — J449 Chronic obstructive pulmonary disease, unspecified: Secondary | ICD-10-CM | POA: Diagnosis not present

## 2021-08-03 DIAGNOSIS — R0602 Shortness of breath: Secondary | ICD-10-CM | POA: Diagnosis not present

## 2021-08-03 DIAGNOSIS — I482 Chronic atrial fibrillation, unspecified: Secondary | ICD-10-CM | POA: Diagnosis not present

## 2021-08-03 DIAGNOSIS — I5033 Acute on chronic diastolic (congestive) heart failure: Secondary | ICD-10-CM | POA: Diagnosis not present

## 2021-08-03 DIAGNOSIS — I739 Peripheral vascular disease, unspecified: Secondary | ICD-10-CM | POA: Diagnosis not present

## 2021-08-19 ENCOUNTER — Telehealth: Payer: Self-pay | Admitting: Cardiology

## 2021-08-19 NOTE — Telephone Encounter (Signed)
6 mth f/u with [place] scheduled ?

## 2021-08-19 NOTE — Telephone Encounter (Signed)
Patient daughter states she is returning [place] called, she would like for her to give her a call back.  ?

## 2021-08-24 NOTE — Progress Notes (Signed)
? ?[facility] ?[address] ?[place], [place] [address] ? ? ?CLINIC:  ?Medical Oncology/Hematology ? ?PCP:  ?Tonya Kaufman, PA-C ?[place] ?[place] [address] ?[phone] ? ? ?REASON FOR VISIT:  ?Follow-up for iron deficiency anemia ?  ?CURRENT THERAPY: Intermittent IV iron infusions ?  ?INTERVAL HISTORY:  ?[name] 88 y.o. female returns for routine follow-up of iron deficiency anemia.  She was last seen by Tarri Abernethy on 03/23/2021.  Her last IV iron was on 12/17/2020.  She is accompanied today by her daughter, [name]. ? ?She continues to have rectal bleeding, which she reports occurs with each bowel movement.  Bleeding varies in intensity from scant bright red blood on tissue paper to "enough bright red blood to turn the water red."  She takes Eliquis for her A-fib.  She reports increased fatigue and dyspnea on exertion over the past month.  She has started to have pica cravings for ice chips.  She denies any restless legs or headaches. ? ?She has chronic intermittent chest pains and is followed by cardiology.  She reports that she had some chest pain yesterday associated with left arm numbness.  She denies any chest pain today. ? ?She was previously noted to have some weight loss due to poor appetite, but her weight has been stable (actually increased 5 pounds) since her last visit in December 2022. ? ?She has little to no energy and 75% appetite. She endorses that she is maintaining a stable weight. ? ? ?REVIEW OF SYSTEMS:  ?Review of Systems  ?Constitutional:  Positive for fatigue. Negative for appetite change, chills, diaphoresis, fever and unexpected weight change.  ?HENT:   Negative for lump/mass and nosebleeds.   ?Eyes:  Negative for eye problems.  ?Respiratory:  Positive for shortness of breath (with exertion). Negative for cough and hemoptysis.   ?Cardiovascular:  Positive for chest pain. Negative for leg swelling and palpitations.  ?Gastrointestinal:  Positive for blood in  stool and constipation. Negative for abdominal pain, diarrhea, nausea and vomiting.  ?Genitourinary:  Negative for hematuria.   ?Musculoskeletal:  Positive for back pain.  ?Skin: Negative.   ?Neurological:  Positive for numbness. Negative for dizziness, headaches and light-headedness.  ?Hematological:  Bruises/bleeds easily (bruising).  ?Psychiatric/Behavioral:  Positive for depression and sleep disturbance. The patient is nervous/anxious.    ? ? ?PAST MEDICAL/SURGICAL HISTORY:  ?Past Medical History:  ?Diagnosis Date  ? Anxiety   ? Arthritis   ? Atrial fibrillation ([place])   ? Bursitis   ? Left shoulder  ? Cataract   ? CHF (congestive heart failure) ([place])   ? CKD (chronic kidney disease)   ? stage 3-4  ? COPD (chronic obstructive pulmonary disease) ([place])   ? Coronary atherosclerosis of native coronary artery   ? a. s/p DES to LCx in 04/2013 b. cath in 11/2015 showing patent stent with 20% prox-LAD and 80% ostial RCA stenosis for which medical management was recommended due to small artery size  ? Depression   ? Diastolic heart failure ([place])   ? EF 55-60%  ? Dysphagia, unspecified(787.20)   ? Dyspnea   ? Dysrhythmia   ? Essential hypertension   ? GERD (gastroesophageal reflux disease)   ? Hx Schatzki's ring, multiple EGD/ED last 01/06/2004  ? Gout   ? Headache   ? History of anemia   ? Hyperlipidemia   ? Internal hemorrhoids without mention of complication   ? MI (myocardial infarction) ([place]) 2006  ? Microscopic colitis 2003  ?  Panic disorder without agoraphobia   ? Paresthesia   ? Pneumonia 12/2011  ? PVD (peripheral vascular disease) ([place])   ? S/P colonoscopy 09/27/2001  ? internal hemorrhoids, desc colon inflam polyp, SB BX-chronic duodenitis, colitis  ? Sleep apnea   ? Thyroid disease   ? ?Past Surgical History:  ?Procedure Laterality Date  ? ABDOMINAL HYSTERECTOMY    ? ABDOMINAL HYSTERECTOMY    ? AGILE CAPSULE N/A 08/30/2020  ? Procedure: AGILE CAPSULE;  Surgeon: Daneil Dolin, MD;  [facility];   Service: Endoscopy;  Laterality: N/A;  7:30am  ? ANTERIOR AND POSTERIOR REPAIR    ? with resection of vagina  ? ANTERIOR LAT LUMBAR FUSION N/A 08/01/2016  ? Procedure: Lumbar Two-Lumbar Five Transpsoas lateral interbody fusion with Lumbar Two-Three lateral plate fixation;  Surgeon: Kevan Ny Ditty, MD;  Location: [place];  Service: Neurosurgery;  Laterality: N/A;  L2-5 Transpsoas lateral interbody fusion with L2-3 lateral plate fixation  ? APPENDECTOMY    ? BACK SURGERY    ? BIOPSY  07/05/2015  ? Procedure: BIOPSY;  Surgeon: Daneil Dolin, MD;  [facility];  Service: Endoscopy;;  gastric polyp biopsy, ascending colon biopsy  ? BIOPSY  02/16/2020  ? Procedure: BIOPSY;  Surgeon: Daneil Dolin, MD;  [facility];  Service: Endoscopy;;  ? BIOPSY  06/28/2020  ? Procedure: BIOPSY;  Surgeon: Daneil Dolin, MD;  [facility];  Service: Endoscopy;;  ? BLADDER SUSPENSION  11/09/2011  ? Procedure: TRANSVAGINAL TAPE (TVT) PROCEDURE;  Surgeon: Marissa Nestle, MD;  [facility];  Service: Urology;  Laterality: N/A;  ? bladder tack  06/2010  ? BREAST LUMPECTOMY  1998  ? left, benign  ? CARDIAC CATHETERIZATION    ? CARDIAC CATHETERIZATION    ? CARDIAC CATHETERIZATION N/A 12/16/2015  ? Procedure: Left Heart Cath and Coronary Angiography;  Surgeon: Troy Sine, MD;  Location: [place] [facility];  Service: Cardiovascular;  Laterality: N/A;  ? CARDIOVERSION N/A 10/04/2017  ? Procedure: CARDIOVERSION;  Surgeon: Herminio Commons, MD;  [facility];  Service: Cardiovascular;  Laterality: N/A;  ? CARDIOVERSION N/A 01/30/2018  ? Procedure: CARDIOVERSION;  Surgeon: Herminio Commons, MD;  [facility];  Service: Cardiovascular;  Laterality: N/A;  ? CARDIOVERSION N/A 11/10/2019  ? Procedure: CARDIOVERSION;  Surgeon: Josue Hector, MD;  Location: [facility] ENDOSCOPY;  Service: Cardiovascular;  Laterality: N/A;  ? [place]  ? left  ? cataract surgery    ?  CHOLECYSTECTOMY  1998  ? Cholecystectomy    ? COLONOSCOPY  03/16/2011  ? multiple hyperplastic colon polyps, sigmoid diverticulosis, melanosis coli  ? COLONOSCOPY WITH PROPOFOL N/A 07/05/2015  ? RMR:one 5 mm polyp in descending colon  ? COLONOSCOPY WITH PROPOFOL N/A 06/28/2020  ? Procedure: COLONOSCOPY WITH PROPOFOL;  Surgeon: Daneil Dolin, MD;  [facility];  Service: Endoscopy;  Laterality: N/A;  am appt  ? CORONARY ANGIOGRAPHY N/A 05/16/2018  ? Procedure: CORONARY ANGIOGRAPHY (CATH LAB);  Surgeon: Belva Crome, MD;  Location: [place] [facility];  Service: Cardiovascular;  Laterality: N/A;  ? CORONARY ANGIOPLASTY WITH STENT PLACEMENT  2015  ? ESOPHAGEAL DILATION N/A 07/05/2015  ? Procedure: ESOPHAGEAL DILATION;  Surgeon: Daneil Dolin, MD;  [facility];  Service: Endoscopy;  Laterality: N/A;  ? ESOPHAGOGASTRODUODENOSCOPY (EGD) WITH PROPOFOL N/A 07/05/2015  ? [id number]  ? ESOPHAGOGASTRODUODENOSCOPY (EGD) WITH PROPOFOL N/A 02/16/2020  ? Procedure: ESOPHAGOGASTRODUODENOSCOPY (  EGD) WITH PROPOFOL;  Surgeon: Daneil Dolin, MD;  Normal examined esophagus s/p dilation, erythematous mucosa in the stomach s/p biopsy, normal examined duodenum. Pathology with mild gastropathy, negative for H. Pylori.   ? GIVENS CAPSULE STUDY N/A 11/10/2020  ? Procedure: GIVENS CAPSULE STUDY;  Surgeon: Daneil Dolin, MD;  [facility];  Service: Endoscopy;  Laterality: N/A;  7:30am  ? JOINT REPLACEMENT Right 2007  ? right knee  ? left hand surgery    ? LEFT HEART CATHETERIZATION WITH CORONARY ANGIOGRAM N/A 05/14/2013  ? Procedure: LEFT HEART CATHETERIZATION WITH CORONARY ANGIOGRAM;  Surgeon: Blane Ohara, MD;  Location: [facility] CATH LAB;  Service: Cardiovascular;  Laterality: N/A;  ? left rotator cuff surgery    ? LUMBAR LAMINECTOMY/DECOMPRESSION MICRODISCECTOMY N/A 10/11/2012  ? Procedure: LUMBAR LAMINECTOMY/DECOMPRESSION MICRODISCECTOMY 2 LEVELS;  Surgeon: Floyce Stakes, MD;  Location: [facility]UR[facility] ORS;   Service: Neurosurgery;  Laterality: N/A;  L3-4 L4-5 Laminectomy  ? LUMBAR WOUND DEBRIDEMENT N/A 09/27/2015  ? Procedure: Exploration of Lumbar Wound w/ Repair CSF Leak/Lumbar Drain Placement;  Surgeon: Leeroy Cha, MD

## 2021-08-25 ENCOUNTER — Inpatient Hospital Stay (HOSPITAL_COMMUNITY): Payer: PPO | Attending: Hematology | Admitting: Physician Assistant

## 2021-08-25 ENCOUNTER — Encounter (HOSPITAL_COMMUNITY): Payer: Self-pay | Admitting: Physician Assistant

## 2021-08-25 VITALS — BP 120/66 | HR 68 | Temp 97.5°F | Resp 18 | Wt 155.6 lb

## 2021-08-25 DIAGNOSIS — Z800 Family history of malignant neoplasm of digestive organs: Secondary | ICD-10-CM | POA: Diagnosis not present

## 2021-08-25 DIAGNOSIS — Z87891 Personal history of nicotine dependence: Secondary | ICD-10-CM | POA: Diagnosis not present

## 2021-08-25 DIAGNOSIS — D500 Iron deficiency anemia secondary to blood loss (chronic): Secondary | ICD-10-CM | POA: Insufficient documentation

## 2021-08-25 DIAGNOSIS — I10 Essential (primary) hypertension: Secondary | ICD-10-CM | POA: Diagnosis not present

## 2021-08-25 DIAGNOSIS — Z90710 Acquired absence of both cervix and uterus: Secondary | ICD-10-CM | POA: Insufficient documentation

## 2021-08-25 NOTE — Patient Instructions (Signed)
[place] at [facility] ?Discharge Instructions ? ?You were seen today by Tarri Abernethy PA-C for your iron deficiency anemia.  Your iron levels are low, but your blood counts is normal.  ? ?We will give IV iron x 2 doses. ? ?You will most likely continue to have low iron levels and low blood for the rest of your life due to the bleeding from your hemorrhoids.   ? ?We will check your labs and see you again in 4 months to see if you need more IV iron at that time. ? ?LABS: Return in 4 months for repeat labs ? ?FOLLOW-UP APPOINTMENT: Office visit in 4 months, after labs ? ? ?Thank you for choosing [place] at [facility] to provide your oncology and hematology care.  To afford each patient quality time with our provider, please arrive at least 15 minutes before your scheduled appointment time.  ? ?If you have a lab appointment with the [facility] please come in thru the Main Entrance and check in at the main information desk. ? ?You need to re-schedule your appointment should you arrive 10 or more minutes late.  We strive to give you quality time with our providers, and arriving late affects you and other patients whose appointments are after yours.  Also, if you no show three or more times for appointments you may be dismissed from the clinic at the providers discretion.     ?Again, thank you for choosing [facility].  Our hope is that these requests will decrease the amount of time that you wait before being seen by our physicians.       ?_____________________________________________________________ ? ?Should you have questions after your visit to [facility], please contact our office at [phone] and follow the prompts.  Our office hours are 8:00 a.m. and 4:30 p.m. Monday - Friday.  Please note that voicemails left after 4:00 p.m. may not be returned until the following business day.  We are closed weekends and major holidays.   You do have access to a nurse 24-7, just call the main number to the clinic [phone] and do not press any options, hold on the line and a nurse will answer the phone.   ? ?For prescription refill requests, have your pharmacy contact our office and allow 72 hours.   ? ?Due to Covid, you will need to wear a mask upon entering the hospital. If you do not have a mask, a mask will be given to you at the Main Entrance upon arrival. For doctor visits, patients may have 1 support person age 83 or older with them. For treatment visits, patients can not have anyone with them due to social distancing guidelines and our immunocompromised population.  ? ? ? ?

## 2021-08-30 ENCOUNTER — Inpatient Hospital Stay (HOSPITAL_COMMUNITY): Payer: PPO

## 2021-08-30 ENCOUNTER — Encounter (HOSPITAL_COMMUNITY): Payer: Self-pay

## 2021-08-30 VITALS — BP 110/71 | HR 69 | Temp 97.5°F | Resp 18

## 2021-08-30 DIAGNOSIS — D500 Iron deficiency anemia secondary to blood loss (chronic): Secondary | ICD-10-CM | POA: Diagnosis not present

## 2021-08-30 MED ORDER — LORATADINE 10 MG PO TABS
10.0000 mg | ORAL_TABLET | Freq: Once | ORAL | Status: AC
  Administered 2021-08-30: 10 mg via ORAL
  Filled 2021-08-30: qty 1

## 2021-08-30 MED ORDER — SODIUM CHLORIDE 0.9 % IV SOLN
510.0000 mg | Freq: Once | INTRAVENOUS | Status: AC
  Administered 2021-08-30: 510 mg via INTRAVENOUS
  Filled 2021-08-30: qty 510

## 2021-08-30 MED ORDER — ACETAMINOPHEN 325 MG PO TABS
650.0000 mg | ORAL_TABLET | Freq: Once | ORAL | Status: AC
  Administered 2021-08-30: 650 mg via ORAL
  Filled 2021-08-30: qty 2

## 2021-08-30 MED ORDER — SODIUM CHLORIDE 0.9 % IV SOLN: Freq: Once | INTRAVENOUS | Status: AC

## 2021-08-30 NOTE — Patient Instructions (Signed)
[facility]  Discharge Instructions: ?Thank you for choosing [facility] to provide your oncology and hematology care.  ?If you have a lab appointment with the Cancer Center, please come in thru the Main Entrance and check in at the main information desk. ? ?Wear comfortable clothing and clothing appropriate for easy access to any Portacath or PICC line.  ? ?We strive to give you quality time with your provider. You may need to reschedule your appointment if you arrive late (15 or more minutes).  Arriving late affects you and other patients whose appointments are after yours.  Also, if you miss three or more appointments without notifying the office, you may be dismissed from the clinic at the provider?s discretion.    ?  ?For prescription refill requests, have your pharmacy contact our office and allow 72 hours for refills to be completed.   ? ?Today you received the following Feraheme, return as scheduled. ?  ?To help prevent nausea and vomiting after your treatment, we encourage you to take your nausea medication as directed. ? ?BELOW ARE SYMPTOMS THAT SHOULD BE REPORTED IMMEDIATELY: ?*FEVER GREATER THAN 100.4 F (38 ?C) OR HIGHER ?*CHILLS OR SWEATING ?*NAUSEA AND VOMITING THAT IS NOT CONTROLLED WITH YOUR NAUSEA MEDICATION ?*UNUSUAL SHORTNESS OF BREATH ?*UNUSUAL BRUISING OR BLEEDING ?*URINARY PROBLEMS (pain or burning when urinating, or frequent urination) ?*BOWEL PROBLEMS (unusual diarrhea, constipation, pain near the anus) ?TENDERNESS IN MOUTH AND THROAT WITH OR WITHOUT PRESENCE OF ULCERS (sore throat, sores in mouth, or a toothache) ?UNUSUAL RASH, SWELLING OR PAIN  ?UNUSUAL VAGINAL DISCHARGE OR ITCHING  ? ?Items with * indicate a potential emergency and should be followed up as soon as possible or go to the Emergency Department if any problems should occur. ? ?Please show the CHEMOTHERAPY ALERT CARD or IMMUNOTHERAPY ALERT CARD at check-in to the Emergency Department and triage  nurse. ? ?Should you have questions after your visit or need to cancel or reschedule your appointment, please contact [facility] [phone]  and follow the prompts.  Office hours are 8:00 a.m. to 4:30 p.m. Monday - Friday. Please note that voicemails left after 4:00 p.m. may not be returned until the following business day.  We are closed weekends and major holidays. You have access to a nurse at all times for urgent questions. Please call the main number to the clinic [phone] and follow the prompts. ? ?For any non-urgent questions, you may also contact your provider using MyChart. We now offer e-Visits for anyone 18 and older to request care online for non-urgent symptoms. For details visit [email or web address]. ?  ?Also download the MyChart app! Go to the app store, search "MyChart", open the app, select [place], and log in with your MyChart username and password. ? ?Due to Covid, a mask is required upon entering the hospital/clinic. If you do not have a mask, one will be given to you upon arrival. For doctor visits, patients may have 1 support person aged 18 or older with them. For treatment visits, patients cannot have anyone with them due to current Covid guidelines and our immunocompromised population.  ?

## 2021-08-30 NOTE — Progress Notes (Signed)
Patient tolerated iron infusion with no complaints voiced.  Peripheral IV site clean and dry with good blood return noted before and after infusion.  Band aid applied.  VSS with discharge and left in satisfactory condition with no s/s of distress noted.   

## 2021-09-06 ENCOUNTER — Inpatient Hospital Stay (HOSPITAL_COMMUNITY): Payer: PPO

## 2021-09-06 ENCOUNTER — Encounter (HOSPITAL_COMMUNITY): Payer: Self-pay

## 2021-09-06 VITALS — BP 117/51 | HR 57 | Temp 97.8°F | Resp 20

## 2021-09-06 DIAGNOSIS — D500 Iron deficiency anemia secondary to blood loss (chronic): Secondary | ICD-10-CM

## 2021-09-06 MED ORDER — SODIUM CHLORIDE 0.9 % IV SOLN: Freq: Once | INTRAVENOUS | Status: AC

## 2021-09-06 MED ORDER — SODIUM CHLORIDE 0.9 % IV SOLN
510.0000 mg | Freq: Once | INTRAVENOUS | Status: AC
  Administered 2021-09-06: 510 mg via INTRAVENOUS
  Filled 2021-09-06: qty 17

## 2021-09-06 NOTE — Progress Notes (Signed)
Patient presents today for Feraheme, patietn reports taking pre-medications before she came. Patient tolerated iron infusion with no complaints voiced. Peripheral IV site clean and dry with good blood return noted before and after infusion. Band aid applied. VSS with discharge and left in satisfactory condition with no s/s of distress noted.

## 2021-09-06 NOTE — Patient Instructions (Signed)
[facility]  Discharge Instructions: ?Thank you for choosing [facility] to provide your oncology and hematology care.  ?If you have a lab appointment with the Cancer Center, please come in thru the Main Entrance and check in at the main information desk. ? ?Wear comfortable clothing and clothing appropriate for easy access to any Portacath or PICC line.  ? ?We strive to give you quality time with your provider. You may need to reschedule your appointment if you arrive late (15 or more minutes).  Arriving late affects you and other patients whose appointments are after yours.  Also, if you miss three or more appointments without notifying the office, you may be dismissed from the clinic at the provider?s discretion.    ?  ?For prescription refill requests, have your pharmacy contact our office and allow 72 hours for refills to be completed.   ? ?Today you received the following Feraheme, return as scheduled. ?  ?To help prevent nausea and vomiting after your treatment, we encourage you to take your nausea medication as directed. ? ?BELOW ARE SYMPTOMS THAT SHOULD BE REPORTED IMMEDIATELY: ?*FEVER GREATER THAN 100.4 F (38 ?C) OR HIGHER ?*CHILLS OR SWEATING ?*NAUSEA AND VOMITING THAT IS NOT CONTROLLED WITH YOUR NAUSEA MEDICATION ?*UNUSUAL SHORTNESS OF BREATH ?*UNUSUAL BRUISING OR BLEEDING ?*URINARY PROBLEMS (pain or burning when urinating, or frequent urination) ?*BOWEL PROBLEMS (unusual diarrhea, constipation, pain near the anus) ?TENDERNESS IN MOUTH AND THROAT WITH OR WITHOUT PRESENCE OF ULCERS (sore throat, sores in mouth, or a toothache) ?UNUSUAL RASH, SWELLING OR PAIN  ?UNUSUAL VAGINAL DISCHARGE OR ITCHING  ? ?Items with * indicate a potential emergency and should be followed up as soon as possible or go to the Emergency Department if any problems should occur. ? ?Please show the CHEMOTHERAPY ALERT CARD or IMMUNOTHERAPY ALERT CARD at check-in to the Emergency Department and triage  nurse. ? ?Should you have questions after your visit or need to cancel or reschedule your appointment, please contact  CANC[facility] CENTER [phone]  and follow the prompts.  Office hours are 8:00 a.m. to 4:30 p.m. Monday - Friday. Please note that voicemails left after 4:00 p.m. may not be returned until the following business day.  We are closed weekends and major holidays. You have access to a nurse at all times for urgent questions. Please call the main number to the clinic [phone] and follow the prompts. ? ?For any non-urgent questions, you may also contact your provider using MyChart. We now offer e-Visits for anyone 18 and older to request care online for non-urgent symptoms. For details visit [email or web address]. ?  ?Also download the MyChart app! Go to the app store, search "MyChart", open the app, select [place], and log in with your MyChart username and password. ? ?Due to Covid, a mask is required upon entering the hospital/clinic. If you do not have a mask, one will be given to you upon arrival. For doctor visits, patients may have 1 support person aged 18 or older with them. For treatment visits, patients cannot have anyone with them due to current Covid guidelines and our immunocompromised population.  ?

## 2021-09-21 DIAGNOSIS — F331 Major depressive disorder, recurrent, moderate: Secondary | ICD-10-CM | POA: Diagnosis not present

## 2021-09-21 DIAGNOSIS — I25118 Atherosclerotic heart disease of native coronary artery with other forms of angina pectoris: Secondary | ICD-10-CM | POA: Diagnosis not present

## 2021-09-21 DIAGNOSIS — M545 Low back pain, unspecified: Secondary | ICD-10-CM | POA: Diagnosis not present

## 2021-09-21 DIAGNOSIS — Z955 Presence of coronary angioplasty implant and graft: Secondary | ICD-10-CM | POA: Diagnosis not present

## 2021-09-21 DIAGNOSIS — J449 Chronic obstructive pulmonary disease, unspecified: Secondary | ICD-10-CM | POA: Diagnosis not present

## 2021-09-21 DIAGNOSIS — Z515 Encounter for palliative care: Secondary | ICD-10-CM | POA: Diagnosis not present

## 2021-09-21 DIAGNOSIS — I739 Peripheral vascular disease, unspecified: Secondary | ICD-10-CM | POA: Diagnosis not present

## 2021-09-21 DIAGNOSIS — N1831 Chronic kidney disease, stage 3a: Secondary | ICD-10-CM | POA: Diagnosis not present

## 2021-09-21 DIAGNOSIS — D692 Other nonthrombocytopenic purpura: Secondary | ICD-10-CM | POA: Diagnosis not present

## 2021-09-26 DIAGNOSIS — R300 Dysuria: Secondary | ICD-10-CM | POA: Diagnosis not present

## 2021-09-26 DIAGNOSIS — M545 Low back pain, unspecified: Secondary | ICD-10-CM | POA: Diagnosis not present

## 2021-09-26 DIAGNOSIS — L89309 Pressure ulcer of unspecified buttock, unspecified stage: Secondary | ICD-10-CM | POA: Diagnosis not present

## 2021-09-26 DIAGNOSIS — R030 Elevated blood-pressure reading, without diagnosis of hypertension: Secondary | ICD-10-CM | POA: Diagnosis not present

## 2021-10-07 DIAGNOSIS — M25572 Pain in left ankle and joints of left foot: Secondary | ICD-10-CM | POA: Diagnosis not present

## 2021-10-12 DIAGNOSIS — Z4800 Encounter for change or removal of nonsurgical wound dressing: Secondary | ICD-10-CM | POA: Diagnosis not present

## 2021-10-12 DIAGNOSIS — Z885 Allergy status to narcotic agent status: Secondary | ICD-10-CM | POA: Diagnosis not present

## 2021-10-12 DIAGNOSIS — I4891 Unspecified atrial fibrillation: Secondary | ICD-10-CM | POA: Diagnosis not present

## 2021-10-12 DIAGNOSIS — N189 Chronic kidney disease, unspecified: Secondary | ICD-10-CM | POA: Diagnosis not present

## 2021-10-12 DIAGNOSIS — Z881 Allergy status to other antibiotic agents status: Secondary | ICD-10-CM | POA: Diagnosis not present

## 2021-10-12 DIAGNOSIS — L89309 Pressure ulcer of unspecified buttock, unspecified stage: Secondary | ICD-10-CM | POA: Diagnosis not present

## 2021-10-12 DIAGNOSIS — Z79899 Other long term (current) drug therapy: Secondary | ICD-10-CM | POA: Diagnosis not present

## 2021-10-12 DIAGNOSIS — F32A Depression, unspecified: Secondary | ICD-10-CM | POA: Diagnosis not present

## 2021-10-12 DIAGNOSIS — I739 Peripheral vascular disease, unspecified: Secondary | ICD-10-CM | POA: Diagnosis not present

## 2021-10-12 DIAGNOSIS — J449 Chronic obstructive pulmonary disease, unspecified: Secondary | ICD-10-CM | POA: Diagnosis not present

## 2021-10-12 DIAGNOSIS — Z9049 Acquired absence of other specified parts of digestive tract: Secondary | ICD-10-CM | POA: Diagnosis not present

## 2021-10-12 DIAGNOSIS — E785 Hyperlipidemia, unspecified: Secondary | ICD-10-CM | POA: Diagnosis not present

## 2021-10-12 DIAGNOSIS — F419 Anxiety disorder, unspecified: Secondary | ICD-10-CM | POA: Diagnosis not present

## 2021-10-12 DIAGNOSIS — L0390 Cellulitis, unspecified: Secondary | ICD-10-CM | POA: Diagnosis not present

## 2021-10-12 DIAGNOSIS — I509 Heart failure, unspecified: Secondary | ICD-10-CM | POA: Diagnosis not present

## 2021-10-12 DIAGNOSIS — L89322 Pressure ulcer of left buttock, stage 2: Secondary | ICD-10-CM | POA: Diagnosis not present

## 2021-10-12 DIAGNOSIS — I129 Hypertensive chronic kidney disease with stage 1 through stage 4 chronic kidney disease, or unspecified chronic kidney disease: Secondary | ICD-10-CM | POA: Diagnosis not present

## 2021-10-12 DIAGNOSIS — K219 Gastro-esophageal reflux disease without esophagitis: Secondary | ICD-10-CM | POA: Diagnosis not present

## 2021-10-12 DIAGNOSIS — K589 Irritable bowel syndrome without diarrhea: Secondary | ICD-10-CM | POA: Diagnosis not present

## 2021-10-12 DIAGNOSIS — M109 Gout, unspecified: Secondary | ICD-10-CM | POA: Diagnosis not present

## 2021-10-12 DIAGNOSIS — Z880 Allergy status to penicillin: Secondary | ICD-10-CM | POA: Diagnosis not present

## 2021-11-08 DIAGNOSIS — F331 Major depressive disorder, recurrent, moderate: Secondary | ICD-10-CM | POA: Diagnosis not present

## 2021-11-08 DIAGNOSIS — M545 Low back pain, unspecified: Secondary | ICD-10-CM | POA: Diagnosis not present

## 2021-11-08 DIAGNOSIS — Z515 Encounter for palliative care: Secondary | ICD-10-CM | POA: Diagnosis not present

## 2021-11-16 ENCOUNTER — Ambulatory Visit: Payer: PPO | Admitting: Cardiology

## 2021-11-16 ENCOUNTER — Encounter: Payer: Self-pay | Admitting: Cardiology

## 2021-11-16 VITALS — BP 128/70 | HR 78 | Ht 61.0 in | Wt 158.2 lb

## 2021-11-16 DIAGNOSIS — I4891 Unspecified atrial fibrillation: Secondary | ICD-10-CM

## 2021-11-16 DIAGNOSIS — E785 Hyperlipidemia, unspecified: Secondary | ICD-10-CM | POA: Diagnosis not present

## 2021-11-16 DIAGNOSIS — L03119 Cellulitis of unspecified part of limb: Secondary | ICD-10-CM | POA: Diagnosis not present

## 2021-11-16 DIAGNOSIS — F32A Depression, unspecified: Secondary | ICD-10-CM | POA: Diagnosis not present

## 2021-11-16 DIAGNOSIS — N189 Chronic kidney disease, unspecified: Secondary | ICD-10-CM | POA: Diagnosis not present

## 2021-11-16 DIAGNOSIS — Z79899 Other long term (current) drug therapy: Secondary | ICD-10-CM | POA: Diagnosis not present

## 2021-11-16 DIAGNOSIS — L89159 Pressure ulcer of sacral region, unspecified stage: Secondary | ICD-10-CM | POA: Diagnosis not present

## 2021-11-16 DIAGNOSIS — J449 Chronic obstructive pulmonary disease, unspecified: Secondary | ICD-10-CM | POA: Diagnosis not present

## 2021-11-16 DIAGNOSIS — Z885 Allergy status to narcotic agent status: Secondary | ICD-10-CM | POA: Diagnosis not present

## 2021-11-16 DIAGNOSIS — Z881 Allergy status to other antibiotic agents status: Secondary | ICD-10-CM | POA: Diagnosis not present

## 2021-11-16 DIAGNOSIS — Z9049 Acquired absence of other specified parts of digestive tract: Secondary | ICD-10-CM | POA: Diagnosis not present

## 2021-11-16 DIAGNOSIS — K219 Gastro-esophageal reflux disease without esophagitis: Secondary | ICD-10-CM | POA: Diagnosis not present

## 2021-11-16 DIAGNOSIS — I5032 Chronic diastolic (congestive) heart failure: Secondary | ICD-10-CM | POA: Diagnosis not present

## 2021-11-16 DIAGNOSIS — L89329 Pressure ulcer of left buttock, unspecified stage: Secondary | ICD-10-CM | POA: Diagnosis not present

## 2021-11-16 DIAGNOSIS — I130 Hypertensive heart and chronic kidney disease with heart failure and stage 1 through stage 4 chronic kidney disease, or unspecified chronic kidney disease: Secondary | ICD-10-CM | POA: Diagnosis not present

## 2021-11-16 DIAGNOSIS — K589 Irritable bowel syndrome without diarrhea: Secondary | ICD-10-CM | POA: Diagnosis not present

## 2021-11-16 DIAGNOSIS — R0602 Shortness of breath: Secondary | ICD-10-CM | POA: Diagnosis not present

## 2021-11-16 DIAGNOSIS — M109 Gout, unspecified: Secondary | ICD-10-CM | POA: Diagnosis not present

## 2021-11-16 DIAGNOSIS — I739 Peripheral vascular disease, unspecified: Secondary | ICD-10-CM | POA: Diagnosis not present

## 2021-11-16 DIAGNOSIS — I509 Heart failure, unspecified: Secondary | ICD-10-CM | POA: Diagnosis not present

## 2021-11-16 DIAGNOSIS — L89322 Pressure ulcer of left buttock, stage 2: Secondary | ICD-10-CM | POA: Diagnosis not present

## 2021-11-16 MED ORDER — DEXAMETHASONE 4 MG PO TABS
8.0000 mg | ORAL_TABLET | Freq: Every day | ORAL | 0 refills | Status: DC
Start: 2021-11-16 — End: 2022-09-20

## 2021-11-16 NOTE — Patient Instructions (Addendum)
Medication Instructions:  Your physician has recommended you make the following change in your medication:  Start dexamethasone 8 mg once a day x 5 days Continue all other medications as directed  Labwork: none  Testing/Procedures:   Follow-Up:  Your physician recommends that you schedule a follow-up appointment in: 6 months  Any Other Special Instructions Will Be Listed Below (If Applicable).  If you need a refill on your cardiac medications before your next appointment, please call your pharmacy.

## 2021-11-16 NOTE — Progress Notes (Signed)
Clinical Summary [name] is a 88 y.o.female seen today for follow up of the following medical problems.   1. Chronic diastolic HF   - 05/1306 echo LVEF 60-65%, no WMAs, indet diastolic fxn,  - some mild edema at times though appears euvolemic today - increased SOB. +cough clear mucous, +wheeze.  - has not used nebulizer, has had some sinus congestion      2. Persistent afib - Recent TEE/DCCV performed 11/10/2019 with 3 shocks resulting in normal sinus rhythm - from notes does better breathing wise in SR   -some palpitations at times, usually with exertion. TEnds to come on with the wheezing, SOB.  - no bleeding on eliquis.    3. CAD - prior DES to LCX in 2015 - repeat caths 2017 and 2020 without obstructive disease other than ostial disease of a small RCA with recs for medical management - 10/2019 nuclear stress: no ischemia   - during last appt with PA Leonides Sake reported exertional chest pain   - 06/2020 nuclear stress: no ischemia   -chronic nonspecific chest pains     4. NSVT - on toprol, has been followed by EP Dr Lovena Le   - no palpitations.    5. Syncope - recent monitor with rare SVT and NSVT, 2% afib burden. No arrhythmias consistent with cause of syncope     6. CKD III   7. Melena/microcytic anemia - followed by GI - recent admit to [facility] 01/2020, Hgb 6.7 - some ongoing bleeding intermittently     8 COPD - only has nebulizers.   [name] Kitchen SOB - no recent LE edema - mainly with activities - some wehezing at times, some cough. Not productive - uses nebulizer twice a day.   Reports allergy to prednsione.  Past Medical History:  Diagnosis Date   Anxiety    Arthritis    Atrial fibrillation ([place])    Bursitis    Left shoulder   Cataract    CHF (congestive heart failure) (HCC)    CKD (chronic kidney disease)    stage 3-4   COPD (chronic obstructive pulmonary disease) (HCC)    Coronary atherosclerosis of native coronary artery    a. s/p DES  to LCx in 04/2013 b. cath in 11/2015 showing patent stent with 20% prox-LAD and 80% ostial RCA stenosis for which medical management was recommended due to small artery size   Depression    Diastolic heart failure (HCC)    EF 55-60%   Dysphagia, unspecified(787.20)    Dyspnea    Dysrhythmia    Essential hypertension    GERD (gastroesophageal reflux disease)    Hx Schatzki's ring, multiple EGD/ED last 01/06/2004   Gout    Headache    History of anemia    Hyperlipidemia    Internal hemorrhoids without mention of complication    MI (myocardial infarction) ([place]) 2006   Microscopic colitis 2003   Panic disorder without agoraphobia    Paresthesia    Pneumonia 12/2011   PVD (peripheral vascular disease) (HCC)    S/P colonoscopy 09/27/2001   internal hemorrhoids, desc colon inflam polyp, SB BX-chronic duodenitis, colitis   Sleep apnea    Thyroid disease      Allergies  Allergen Reactions   Cephalosporins Diarrhea and Nausea Only    Lightheaded   Levaquin [Levofloxacin In D5w] Swelling   Macrodantin [Nitrofurantoin Macrocrystal] Swelling   Phenothiazines Anaphylaxis and Hives   Polysorbate Anaphylaxis   Prednisone Shortness Of Breath  Buspirone Itching   Cardura [Doxazosin Mesylate] Itching   Codeine Itching   Acyclovir And Related Itching    Redness of skin   Colcrys [Colchicine] Nausea Only    Upset stomach    Prochlorperazine Other (See Comments)    "Upset stomach"   Ranexa [Ranolazine]     Severe drop in BP   Atorvastatin Hives    Cramping; tolerates Crestor ok   Colestipol Palpitations   Ofloxacin Rash   Other Itching and Rash    "WOOL"= make skin look like it has been burned   Penicillins Other (See Comments)    Causes redness all over. Has patient had a PCN reaction causing immediate rash, facial/tongue/throat swelling, SOB or lightheadedness with hypotension: No Has patient had a PCN reaction causing severe rash involving mucus membranes or skin necrosis: No Has  patient had a PCN reaction that required hospitalization No Has patient had a PCN reaction occurring within the last 10 years: No If all of the above answers are "NO", then may proceed with Cephalosporin use.    Pimozide Hives and Itching     Current Outpatient Medications  Medication Sig Dispense Refill   acetaminophen (TYLENOL) 500 MG tablet Take 500 mg by mouth every 6 (six) hours as needed for headache or moderate pain.     ALPRAZolam (XANAX) 0.5 MG tablet Take 0.5 mg by mouth daily as needed for anxiety or sleep.     apixaban (ELIQUIS) 5 MG TABS tablet Take 1 tablet (5 mg total) by mouth 2 (two) times daily. 180 tablet 3   ascorbic acid (VITAMIN C) 500 MG tablet Take 1 tablet (500 mg total) by mouth daily. 30 tablet 2   dexamethasone (DECADRON) 4 MG tablet Take 8 mg by mouth daily.     diltiazem (CARDIZEM CD) 120 MG 24 hr capsule TAKE ONE CAPSULE BY MOUTH DAILY 90 capsule 3   DULoxetine (CYMBALTA) 60 MG capsule Take 60 mg by mouth at bedtime.     ferrous sulfate 325 (65 FE) MG tablet Take 1 tablet (325 mg total) by mouth daily with breakfast. 30 tablet 3   fluticasone (CUTIVATE) 0.05 % cream Apply 1 application topically 2 (two) times daily as needed (skin tears).     furosemide (LASIX) 40 MG tablet Take 1 tablet (40 mg total) by mouth daily.     hydrocortisone (ANUSOL-HC) 2.5 % rectal cream Place 1 application rectally 2 (two) times daily. 30 g 1   isosorbide mononitrate (IMDUR) 60 MG 24 hr tablet Take 60 mg by mouth daily.     magnesium oxide (MAG-OX) 400 (240 Mg) MG tablet Take 1 tablet by mouth daily.     magnesium oxide (MAG-OX) 400 MG tablet TAKE ONE TABLET BY MOUTH DAILY 90 tablet 3   metoprolol succinate (TOPROL-XL) 25 MG 24 hr tablet Take 75 mg by mouth daily.     Multiple Vitamins-Minerals (HAIR/SKIN/NAILS) CAPS Take 1 tablet by mouth daily.     Naphazoline HCl (CLEAR EYES OP) Place 1 drop into both eyes daily as needed (itching).     nitroGLYCERIN (NITROSTAT) 0.4 MG SL  tablet DISSOLVE ONE TABLET UNDER TONGUE EVERY 5 MINUTES UP TO 3 DOSES AS NEEDED FOR CHEST PAIN. 25 tablet 6   pantoprazole (PROTONIX) 40 MG tablet TAKE ONE TABLET BY MOUTH TWICE DAILY BEFORE MEALS 60 tablet 5   potassium chloride (KLOR-CON) 10 MEQ tablet TAKE ONE TABLET BY MOUTH DAILY, 90 tablet 1   potassium chloride (MICRO-K) 10 MEQ CR capsule Take 10  mEq by mouth daily.     traMADol (ULTRAM) 50 MG tablet Take 1 tablet (50 mg total) by mouth every 6 (six) hours as needed. 90 tablet 3   triamcinolone cream (KENALOG) 0.1 % Apply topically 2 (two) times daily.     zinc sulfate 220 (50 Zn) MG capsule Take 1 capsule (220 mg total) by mouth daily. 30 capsule 3   No current facility-administered medications for this visit.     Past Surgical History:  Procedure Laterality Date   ABDOMINAL HYSTERECTOMY     ABDOMINAL HYSTERECTOMY     AGILE CAPSULE N/A 08/30/2020   Procedure: AGILE CAPSULE;  Surgeon: Daneil Dolin, MD;  [facility];  Service: Endoscopy;  Laterality: N/A;  7:30am   ANTERIOR AND POSTERIOR REPAIR     with resection of vagina   ANTERIOR LAT LUMBAR FUSION N/A 08/01/2016   Procedure: Lumbar Two-Lumbar Five Transpsoas lateral interbody fusion with Lumbar Two-Three lateral plate fixation;  Surgeon: Kevan Ny Ditty, MD;  Location: [place];  Service: Neurosurgery;  Laterality: N/A;  L2-5 Transpsoas lateral interbody fusion with L2-3 lateral plate fixation   APPENDECTOMY     BACK SURGERY     BIOPSY  07/05/2015   Procedure: BIOPSY;  Surgeon: Daneil Dolin, MD;  [facility];  Service: Endoscopy;;  gastric polyp biopsy, ascending colon biopsy   BIOPSY  02/16/2020   Procedure: BIOPSY;  Surgeon: Daneil Dolin, MD;  [facility];  Service: Endoscopy;;   BIOPSY  06/28/2020   Procedure: BIOPSY;  Surgeon: Daneil Dolin, MD;  [facility];  Service: Endoscopy;;   BLADDER SUSPENSION  11/09/2011   Procedure: TRANSVAGINAL TAPE (TVT) PROCEDURE;   Surgeon: Marissa Nestle, MD;  [facility];  Service: Urology;  Laterality: N/A;   bladder tack  06/2010   BREAST LUMPECTOMY  1998   left, benign   CARDIAC CATHETERIZATION     CARDIAC CATHETERIZATION     CARDIAC CATHETERIZATION N/A 12/16/2015   Procedure: Left Heart Cath and Coronary Angiography;  Surgeon: Troy Sine, MD;  Location: [place] CV LAB;  Service: Cardiovascular;  Laterality: N/A;   CARDIOVERSION N/A 10/04/2017   Procedure: CARDIOVERSION;  Surgeon: Herminio Commons, MD;  [facility];  Service: Cardiovascular;  Laterality: N/A;   CARDIOVERSION N/A 01/30/2018   Procedure: CARDIOVERSION;  Surgeon: Herminio Commons, MD;  [facility];  Service: Cardiovascular;  Laterality: N/A;   CARDIOVERSION N/A 11/10/2019   Procedure: CARDIOVERSION;  Surgeon: Josue Hector, MD;  Location: [facility] ENDOSCOPY;  Service: Cardiovascular;  Laterality: N/A;   [place]   left   cataract surgery     CHOLECYSTECTOMY  1998   Cholecystectomy     COLONOSCOPY  03/16/2011   multiple hyperplastic colon polyps, sigmoid diverticulosis, melanosis coli   COLONOSCOPY WITH PROPOFOL N/A 07/05/2015   RMR:one 5 mm polyp in descending colon   COLONOSCOPY WITH PROPOFOL N/A 06/28/2020   Procedure: COLONOSCOPY WITH PROPOFOL;  Surgeon: Daneil Dolin, MD;  [facility];  Service: Endoscopy;  Laterality: N/A;  am appt   CORONARY ANGIOGRAPHY N/A 05/16/2018   Procedure: CORONARY ANGIOGRAPHY (CATH LAB);  Surgeon: Belva Crome, MD;  Location: [place] [facility];  Service: Cardiovascular;  Laterality: N/A;   CORONARY ANGIOPLASTY WITH STENT PLACEMENT  2015   ESOPHAGEAL DILATION N/A 07/05/2015   Procedure: ESOPHAGEAL DILATION;  Surgeon: Daneil Dolin, MD;  [facility];  Service: Endoscopy;  Laterality: N/A;   ESOPHAGOGASTRODUODENOSCOPY (EGD) WITH PROPOFOL N/A 07/05/2015   ASN:[id number]   ESOPHAGOGASTRODUODENOSCOPY (EGD) WITH PROPOFOL N/A 02/16/2020    Procedure: ESOPHAGOGASTRODUODENOSCOPY (EGD) WITH PROPOFOL;  Surgeon: Daneil Dolin, MD;  Normal examined esophagus s/p dilation, erythematous mucosa in the stomach s/p biopsy, normal examined duodenum. Pathology with mild gastropathy, negative for H. Pylori.    GIVENS CAPSULE STUDY N/A 11/10/2020   Procedure: GIVENS CAPSULE STUDY;  Surgeon: Daneil Dolin, MD;  [facility];  Service: Endoscopy;  Laterality: N/A;  7:30am   JOINT REPLACEMENT Right 2007   right knee   left hand surgery     LEFT HEART CATHETERIZATION WITH CORONARY ANGIOGRAM N/A 05/14/2013   Procedure: LEFT HEART CATHETERIZATION WITH CORONARY ANGIOGRAM;  Surgeon: Blane Ohara, MD;  [facility];  Service: Cardiovascular;  Laterality: N/A;   left rotator cuff surgery     LUMBAR LAMINECTOMY/DECOMPRESSION MICRODISCECTOMY N/A 10/11/2012   Procedure: LUMBAR LAMINECTOMY/DECOMPRESSION MICRODISCECTOMY 2 LEVELS;  Surgeon: Floyce Stakes, MD;  Location: [place] [facility] ORS;  Service: Neurosurgery;  Laterality: N/A;  L3-4 L4-5 Laminectomy   LUMBAR WOUND DEBRIDEMENT N/A 09/27/2015   Procedure: Exploration of Lumbar Wound w/ Repair CSF Leak/Lumbar Drain Placement;  Surgeon: Leeroy Cha, MD;  Location: [place] NEURO ORS;  Service: Neurosurgery;  Laterality: N/A;   MALONEY DILATION  03/16/2011   Gastritis. No H.pylori on bx. 73F maloney dilation with disruption of  occult cevical esophageal web   MALONEY DILATION N/A 02/16/2020   Procedure: MALONEY DILATION;  Surgeon: Daneil Dolin, MD;  [facility];  Service: Endoscopy;  Laterality: N/A;   NASAL SINUS SURGERY     right knee replacement  2007   right leg benign tumor     SHOULDER SURGERY Left    TEE WITHOUT CARDIOVERSION N/A 10/04/2017   Procedure: TRANSESOPHAGEAL ECHOCARDIOGRAM (TEE) WITH PROPOFOL;  Surgeon: Herminio Commons, MD;  [facility];  Service: Cardiovascular;  Laterality: N/A;   TEE WITHOUT CARDIOVERSION N/A 11/10/2019   Procedure: TRANSESOPHAGEAL  ECHOCARDIOGRAM (TEE);  Surgeon: Josue Hector, MD;  Location[facility] ENDOSCOPY;  Service: Cardiovascular;  Laterality: N/A;   TONSILLECTOMY     unspecified area, hysterectomy  1972   partial     Allergies  Allergen Reactions   Cephalosporins Diarrhea and Nausea Only    Lightheaded   Levaquin [Levofloxacin In D5w] Swelling   Macrodantin [Nitrofurantoin Macrocrystal] Swelling   Phenothiazines Anaphylaxis and Hives   Polysorbate Anaphylaxis   Prednisone Shortness Of Breath   Buspirone Itching   Cardura [Doxazosin Mesylate] Itching   Codeine Itching   Acyclovir And Related Itching    Redness of skin   Colcrys [Colchicine] Nausea Only    Upset stomach    Prochlorperazine Other (See Comments)    "Upset stomach"   Ranexa [Ranolazine]     Severe drop in BP   Atorvastatin Hives    Cramping; tolerates Crestor ok   Colestipol Palpitations   Ofloxacin Rash   Other Itching and Rash    "WOOL"= make skin look like it has been burned   Penicillins Other (See Comments)    Causes redness all over. Has patient had a PCN reaction causing immediate rash, facial/tongue/throat swelling, SOB or lightheadedness with hypotension: No Has patient had a PCN reaction causing severe rash involving mucus membranes or skin necrosis: No Has patient had a PCN reaction that required hospitalization No Has patient had a PCN reaction occurring within the last 10 years: No If  all of the above answers are "NO", then may proceed with Cephalosporin use.    Pimozide Hives and Itching      Family History  Problem Relation Age of Onset   Stroke Mother    Parkinson's disease Father    Coronary artery disease Other        family Hx-sons   Cancer Other    Stroke Other        family Hx   Hypertension Other        family Hx   Diabetes Brother    Heart disease Son        before age 2   Diabetes Son    Stroke Daughter 40   Colon cancer Grandson        diagnosed 53   Inflammatory bowel disease Neg Hx       Social History Ms. Miner reports that she quit smoking about 20 years ago. Her smoking use included cigarettes. She started smoking about 73 years ago. She has a 64.00 pack-year smoking history. She has never used smokeless tobacco. Ms. Gahm reports that she does not currently use alcohol.   Review of Systems CONSTITUTIONAL: No weight loss, fever, chills, weakness or fatigue.  HEENT: Eyes: No visual loss, blurred vision, double vision or yellow sclerae.No hearing loss, sneezing, congestion, runny nose or sore throat.  SKIN: No rash or itching.  CARDIOVASCULAR: per hpi RESPIRATORY: No shortness of breath, cough or sputum.  GASTROINTESTINAL: No anorexia, nausea, vomiting or diarrhea. No abdominal pain or blood.  GENITOURINARY: No burning on urination, no polyuria NEUROLOGICAL: No headache, dizziness, syncope, paralysis, ataxia, numbness or tingling in the extremities. No change in bowel or bladder control.  MUSCULOSKELETAL: No muscle, back pain, joint pain or stiffness.  LYMPHATICS: No enlarged nodes. No history of splenectomy.  PSYCHIATRIC: No history of depression or anxiety.  ENDOCRINOLOGIC: No reports of sweating, cold or heat intolerance. No polyuria or polydipsia.  [name] Kitchen   Physical Examination Today's Vitals   11/16/21 1245  BP: 128/70  Pulse: 78  SpO2: 97%  Weight: 158 lb 3.2 oz (71.8 kg)  Height: '5\' 1"'$  (1.549 m)   Body mass index is 29.89 kg/m.  Gen: resting comfortably, no acute distress HEENT: no scleral icterus, pupils equal round and reactive, no palptable cervical adenopathy,  CV: irreg, no m/r/g no jvd Resp: Clear to auscultation bilaterally GI: abdomen is soft, non-tender, non-distended, normal bowel sounds, no hepatosplenomegaly MSK: extremities are warm, no edema.  Skin: warm, no rash Neuro:  no focal deficits Psych: appropriate affect   Diagnostic Studies  Cardiac Catheterization: 05/16/2018 Patent short left main 20 to 30% proximal LAD  irregularity, with diffuse 20% narrowing within the mid vessel. Patent proximal circumflex stent with mid to distal circumflex irregularities up to 30 to 40%. Nondominant right coronary with ostial 90% narrowing. No hemodynamics recorded.  Left ventriculography was not performed in an effort to decrease contrast exposure in the setting of CKD.   RECOMMENDATIONS:   Compared to prior angiography in 2017, no significant changes noted. Chest pain is atypical and may be more musculoskeletal or pleuritic. Resume Eliquis in 12 hours/a.m. of 05/17/2018   Echocardiogram 10/02/18:   1. The left ventricle has normal systolic function with an ejection  fraction of 60-65%. The cavity size was normal. There is mildly increased  left ventricular wall thickness. Left ventricular diastolic Doppler  parameters are consistent with impaired  relaxation. Elevated mean left atrial pressure.   2. The right ventricle has normal systolic  function. The cavity was  normal. There is no increase in right ventricular wall thickness.   3. Left atrial size was mildly dilated.   4. The aortic valve is tricuspid. Mild thickening of the aortic valve.  Mild calcification of the aortic valve. Aortic valve regurgitation is mild  by color flow Doppler. No stenosis of the aortic valve. Mild aortic  annular calcification noted.   5. The mitral valve is abnormal. Mild thickening of the mitral valve  leaflet. Mild calcification of the mitral valve leaflet. There is mild  mitral annular calcification present. No evidence of mitral valve  stenosis.   6. The aortic root is normal in size and structure.   7. Pulmonary hypertension is indeterminant, inadequate TR jet.      Event monitor 05/13/19:   1. NSR 2. NSVT, lasting upto 6 beats 3. No prolonged pauses or bradycardia 4. No atrial fib     09/2019 echo 1. Left ventricular ejection fraction, by estimation, is 60 to 65%. The  left ventricle has normal function. The left  ventricle has no regional  wall motion abnormalities. There is mild left ventricular hypertrophy.  Left ventricular diastolic parameters  are indeterminate.   2. Right ventricular systolic function is normal. The right ventricular  size is normal. There is normal pulmonary artery systolic pressure.   3. The mitral valve is normal in structure. Trivial mitral valve  regurgitation. No evidence of mitral stenosis.   4. The aortic valve was not well visualized. Aortic valve regurgitation  is mild. No aortic stenosis is present.   05/2020 echo IMPRESSIONS     1. Left ventricular ejection fraction, by estimation, is 60 to 65%. The  left ventricle has normal function. The left ventricle has no regional  wall motion abnormalities. Left ventricular diastolic parameters are  indeterminate.   2. Right ventricular systolic function is normal. The right ventricular  size is normal.   3. The mitral valve is normal in structure. No evidence of mitral valve  regurgitation. No evidence of mitral stenosis.   4. The aortic valve has an indeterminant number of cusps. There is mild  calcification of the aortic valve. There is mild thickening of the aortic  valve. Aortic valve regurgitation is trivial. No aortic stenosis is  present.   5. The inferior vena cava is normal in size with greater than 50%  respiratory variability, suggesting right atrial pressure of 3 mmHg.   05/2020 ABI Summary:  Right: Resting right ankle-brachial index indicates moderate right lower  extremity arterial disease. The right toe-brachial index is abnormal. RT  great toe pressure = 74 mmHg.   Left: Resting left ankle-brachial index indicates moderate left lower  extremity arterial disease. The left toe-brachial index is abnormal. LT  Great toe pressure = 73 mmHg.   Assessment and Plan  1.Chronic diastolic HF - appers euvolemic today, I don't see that her SOB is HF related. With reported cough and wheezing likely more  COPD related - continue current meds   2. Afib -overall doing well, continue current meds   2. COPD/SOB - recent SOB, wheezing, cough - has seen pulmonary, currently just has prn nebulizers. Defer longterm management to pcp and pulm - given symptoms 5 day course of decadron '8mg'$  (reports side effects to prednisone but tolerated decadron), encouraged more regular use of her nebulizers   Arnoldo Lenis, M.D.

## 2021-12-12 ENCOUNTER — Other Ambulatory Visit: Payer: Self-pay | Admitting: Cardiology

## 2021-12-15 ENCOUNTER — Other Ambulatory Visit: Payer: Self-pay | Admitting: *Deleted

## 2021-12-15 NOTE — Patient Outreach (Signed)
  Care Coordination   12/15/2021 Name: [name] MRN: [id number] DOB: [birth year]   Care Coordination Outreach Attempts:  An unsuccessful telephone outreach was attempted today to offer the patient information about available care coordination services as a benefit of their health plan.   Follow Up Plan:  Additional outreach attempts will be made to offer the patient care coordination information and services.   Encounter Outcome:  No Answer  Care Coordination Interventions Activated:  No   Care Coordination Interventions:  No, not indicated    Valente David, RN, MSN, [facility] Care Management Care Management Coordinator [phone]

## 2021-12-20 ENCOUNTER — Telehealth: Payer: Self-pay | Admitting: *Deleted

## 2021-12-20 NOTE — Patient Outreach (Signed)
  Care Coordination   12/20/2021 Name: ANNITA RATLIFF MRN: [id number] DOB: 07/26/1932   Care Coordination Outreach Attempts:  A second unsuccessful outreach was attempted today to offer the patient with information about available care coordination services as a benefit of their health plan.     Follow Up Plan:  Additional outreach attempts will be made to offer the patient care coordination information and services.   Encounter Outcome:  No Answer  Care Coordination Interventions Activated:  No   Care Coordination Interventions:  No, not indicated    Valente David, RN, MSN, [facility] Care Management Care Management Coordinator [phone]

## 2021-12-22 ENCOUNTER — Telehealth: Payer: Self-pay | Admitting: *Deleted

## 2021-12-22 NOTE — Patient Outreach (Signed)
  Care Coordination   12/22/2021 Name: [name] MRN: [id number] DOB: 12-02-1932   Care Coordination Outreach Attempts:  A third unsuccessful outreach was attempted today to offer the patient with information about available care coordination services as a benefit of their health plan.   Follow Up Plan:  No further outreach attempts will be made at this time. We have been unable to contact the patient to offer or enroll patient in care coordination services  Encounter Outcome:  No Answer  Care Coordination Interventions Activated:  No   Care Coordination Interventions:  No, not indicated    Valente David, RN, MSN, [facility] Care Management Care Management Coordinator [phone]

## 2021-12-27 ENCOUNTER — Inpatient Hospital Stay: Payer: PPO | Attending: Physician Assistant

## 2021-12-27 DIAGNOSIS — D500 Iron deficiency anemia secondary to blood loss (chronic): Secondary | ICD-10-CM | POA: Diagnosis not present

## 2021-12-27 DIAGNOSIS — Z87891 Personal history of nicotine dependence: Secondary | ICD-10-CM | POA: Diagnosis not present

## 2021-12-27 DIAGNOSIS — Z90710 Acquired absence of both cervix and uterus: Secondary | ICD-10-CM | POA: Diagnosis not present

## 2021-12-27 DIAGNOSIS — Z800 Family history of malignant neoplasm of digestive organs: Secondary | ICD-10-CM | POA: Insufficient documentation

## 2021-12-27 DIAGNOSIS — I130 Hypertensive heart and chronic kidney disease with heart failure and stage 1 through stage 4 chronic kidney disease, or unspecified chronic kidney disease: Secondary | ICD-10-CM | POA: Insufficient documentation

## 2021-12-27 DIAGNOSIS — I5030 Unspecified diastolic (congestive) heart failure: Secondary | ICD-10-CM | POA: Diagnosis not present

## 2021-12-27 DIAGNOSIS — N184 Chronic kidney disease, stage 4 (severe): Secondary | ICD-10-CM | POA: Insufficient documentation

## 2021-12-27 LAB — CBC WITH DIFFERENTIAL/PLATELET
Abs Immature Granulocytes: 0.14 10*3/uL — ABNORMAL HIGH (ref 0.00–0.07)
Basophils Absolute: 0.1 10*3/uL (ref 0.0–0.1)
Basophils Relative: 1 %
Eosinophils Absolute: 0.1 10*3/uL (ref 0.0–0.5)
Eosinophils Relative: 1 %
HCT: 39.7 % (ref 36.0–46.0)
Hemoglobin: 13.6 g/dL (ref 12.0–15.0)
Immature Granulocytes: 1 %
Lymphocytes Relative: 28 %
Lymphs Abs: 2.7 10*3/uL (ref 0.7–4.0)
MCH: 32.4 pg (ref 26.0–34.0)
MCHC: 34.3 g/dL (ref 30.0–36.0)
MCV: 94.5 fL (ref 80.0–100.0)
Monocytes Absolute: 0.8 10*3/uL (ref 0.1–1.0)
Monocytes Relative: 8 %
Neutro Abs: 6 10*3/uL (ref 1.7–7.7)
Neutrophils Relative %: 61 %
Platelets: 291 10*3/uL (ref 150–400)
RBC: 4.2 MIL/uL (ref 3.87–5.11)
RDW: 14.2 % (ref 11.5–15.5)
WBC: 9.7 10*3/uL (ref 4.0–10.5)
nRBC: 0 % (ref 0.0–0.2)

## 2021-12-27 LAB — FERRITIN: Ferritin: 263 ng/mL (ref 11–307)

## 2021-12-27 LAB — IRON AND TIBC
Iron: 112 ug/dL (ref 28–170)
Saturation Ratios: 30 % (ref 10.4–31.8)
TIBC: 369 ug/dL (ref 250–450)
UIBC: 257 ug/dL

## 2021-12-28 DIAGNOSIS — I130 Hypertensive heart and chronic kidney disease with heart failure and stage 1 through stage 4 chronic kidney disease, or unspecified chronic kidney disease: Secondary | ICD-10-CM | POA: Diagnosis not present

## 2021-12-28 DIAGNOSIS — E785 Hyperlipidemia, unspecified: Secondary | ICD-10-CM | POA: Diagnosis not present

## 2021-12-28 DIAGNOSIS — J449 Chronic obstructive pulmonary disease, unspecified: Secondary | ICD-10-CM | POA: Diagnosis not present

## 2021-12-28 DIAGNOSIS — Z885 Allergy status to narcotic agent status: Secondary | ICD-10-CM | POA: Diagnosis not present

## 2021-12-28 DIAGNOSIS — K219 Gastro-esophageal reflux disease without esophagitis: Secondary | ICD-10-CM | POA: Diagnosis not present

## 2021-12-28 DIAGNOSIS — L89322 Pressure ulcer of left buttock, stage 2: Secondary | ICD-10-CM | POA: Diagnosis not present

## 2021-12-28 DIAGNOSIS — M109 Gout, unspecified: Secondary | ICD-10-CM | POA: Diagnosis not present

## 2021-12-28 DIAGNOSIS — N189 Chronic kidney disease, unspecified: Secondary | ICD-10-CM | POA: Diagnosis not present

## 2021-12-28 DIAGNOSIS — I739 Peripheral vascular disease, unspecified: Secondary | ICD-10-CM | POA: Diagnosis not present

## 2021-12-28 DIAGNOSIS — Z79899 Other long term (current) drug therapy: Secondary | ICD-10-CM | POA: Diagnosis not present

## 2021-12-28 DIAGNOSIS — Z888 Allergy status to other drugs, medicaments and biological substances status: Secondary | ICD-10-CM | POA: Diagnosis not present

## 2021-12-28 DIAGNOSIS — Z881 Allergy status to other antibiotic agents status: Secondary | ICD-10-CM | POA: Diagnosis not present

## 2021-12-28 DIAGNOSIS — K589 Irritable bowel syndrome without diarrhea: Secondary | ICD-10-CM | POA: Diagnosis not present

## 2021-12-28 DIAGNOSIS — F32A Depression, unspecified: Secondary | ICD-10-CM | POA: Diagnosis not present

## 2021-12-28 DIAGNOSIS — I509 Heart failure, unspecified: Secondary | ICD-10-CM | POA: Diagnosis not present

## 2022-01-02 NOTE — Progress Notes (Unsigned)
[name], [place] [address]   CLINIC:  Medical Oncology/Hematology  PCP:  Rosine Door [place] [address] [phone]   REASON FOR VISIT:  Follow-up for iron deficiency anemia   CURRENT THERAPY: Intermittent IV iron infusions   INTERVAL HISTORY:  [name] 88 y.o. female returns for routine follow-up of iron deficiency anemia.  [name] was last seen by Tarri Abernethy on 08/25/2021.  [name] last IV iron was on 09/06/2021.   [name] continues to have rectal bleeding, which [name] reports occurs with each bowel movement.  Bleeding varies in intensity from scant bright red blood on tissue paper to "enough bright red blood to turn the water red."  [name] takes Eliquis for [name] A-fib.  [name] reports progressively increasing fatigue and dyspnea on exertion over the past month.  [name] denies any pica, restless legs or headaches.  [name] has multiple other complaints unrelated to today's visit, which are further documented in ROS. [name] has little to no energy and 30% appetite. [name] endorses that [name] is maintaining a stable weight.   REVIEW OF SYSTEMS:  Review of Systems  Constitutional:  Positive for diaphoresis and fatigue. Negative for appetite change, chills, fever and unexpected weight change.  HENT:   Positive for trouble swallowing. Negative for lump/mass and nosebleeds.   Eyes:  Negative for eye problems.  Respiratory:  Positive for cough and shortness of breath (with exertion). Negative for hemoptysis.   Cardiovascular:  Positive for chest pain and palpitations. Negative for leg swelling.  Gastrointestinal:  Positive for blood in stool and constipation. Negative for abdominal pain, diarrhea, nausea and vomiting.  Genitourinary:  Negative for hematuria.   Musculoskeletal:  Positive for back pain (sciatica, recent back surgery).  Skin: Negative.   Neurological:  Positive for dizziness, headaches and numbness. Negative for  light-headedness.  Hematological:  Bruises/bleeds easily (bruising).  Psychiatric/Behavioral:  Positive for depression (feels sad this time of year, lost multiple family members around this time of year - seeing psych for depression) and sleep disturbance. Negative for suicidal ideas. The patient is nervous/anxious.       PAST MEDICAL/SURGICAL HISTORY:  Past Medical History:  Diagnosis Date   Anxiety    Arthritis    Atrial fibrillation (HCC)    Bursitis    Left shoulder   Cataract    CHF (congestive heart failure) (HCC)    CKD (chronic kidney disease)    stage 3-4   COPD (chronic obstructive pulmonary disease) (HCC)    Coronary atherosclerosis of native coronary artery    a. s/p DES to LCx in 04/2013 b. cath in 11/2015 showing patent stent with 20% prox-LAD and 80% ostial RCA stenosis for which medical management was recommended due to small artery size   Depression    Diastolic heart failure (HCC)    EF 55-60%   Dysphagia, unspecified(787.20)    Dyspnea    Dysrhythmia    Essential hypertension    GERD (gastroesophageal reflux disease)    Hx Schatzki's ring, multiple EGD/ED last 01/06/2004   Gout    Headache    History of anemia    Hyperlipidemia    Internal hemorrhoids without mention of complication    MI (myocardial infarction) ([place]) 2006   Microscopic colitis 2003   Panic disorder without agoraphobia    Paresthesia    Pneumonia 12/2011   PVD (peripheral vascular disease) (HCC)    S/P colonoscopy 09/27/2001   internal hemorrhoids, desc colon  inflam polyp, SB BX-chronic duodenitis, colitis   Sleep apnea    Thyroid disease    Past Surgical History:  Procedure Laterality Date   ABDOMINAL HYSTERECTOMY     ABDOMINAL HYSTERECTOMY     AGILE CAPSULE N/A 08/30/2020   Procedure: AGILE CAPSULE;  Surgeon: Daneil Dolin, MD;  [facility];  Service: Endoscopy;  Laterality: N/A;  7:30am   ANTERIOR AND POSTERIOR REPAIR     with resection of vagina   ANTERIOR LAT  LUMBAR FUSION N/A 08/01/2016   Procedure: Lumbar Two-Lumbar Five Transpsoas lateral interbody fusion with Lumbar Two-Three lateral plate fixation;  Surgeon: Kevan Ny Ditty, MD;  Location: [facility];  Service: Neurosurgery;  Laterality: N/A;  L2-5 Transpsoas lateral interbody fusion with L2-3 lateral plate fixation   APPENDECTOMY     BACK SURGERY     BIOPSY  07/05/2015   Procedure: BIOPSY;  Surgeon: Daneil Dolin, MD;  [facility];  Service: Endoscopy;;  gastric polyp biopsy, ascending colon biopsy   BIOPSY  02/16/2020   Procedure: BIOPSY;  Surgeon: Daneil Dolin, MD;  [facility];  Service: Endoscopy;;   BIOPSY  06/28/2020   Procedure: BIOPSY;  Surgeon: Daneil Dolin, MD;  [facility];  Service: Endoscopy;;   BLADDER SUSPENSION  11/09/2011   Procedure: TRANSVAGINAL TAPE (TVT) PROCEDURE;  Surgeon: Marissa Nestle, MD;  [facility]: [facility];  Service: Urology;  Laterality: N/A;   bladder tack  06/2010   BREAST LUMPECTOMY  1998   left, benign   CARDIAC CATHETERIZATION     CARDIAC CATHETERIZATION     CARDIAC CATHETERIZATION N/A 12/16/2015   Procedure: Left Heart Cath and Coronary Angiography;  Surgeon: Troy Sine, MD;  Location: [facility];  Service: Cardiovascular;  Laterality: N/A;   CARDIOVERSION N/A 10/04/2017   Procedure: CARDIOVERSION;  Surgeon: Herminio Commons, MD;  [facility];  Service: Cardiovascular;  Laterality: N/A;   CARDIOVERSION N/A 01/30/2018   Procedure: CARDIOVERSION;  Surgeon: Herminio Commons, MD;  [facility];  Service: Cardiovascular;  Laterality: N/A;   CARDIOVERSION N/A 11/10/2019   Procedure: CARDIOVERSION;  Surgeon: Josue Hector, MD;  Location: [facility];  Service: Cardiovascular;  Laterality: N/A;   [place]   left   cataract surgery     CHOLECYSTECTOMY  1998   Cholecystectomy     COLONOSCOPY  03/16/2011   multiple hyperplastic colon polyps, sigmoid diverticulosis,  melanosis coli   COLONOSCOPY WITH PROPOFOL N/A 07/05/2015   RMR:one 5 mm polyp in descending colon   COLONOSCOPY WITH PROPOFOL N/A 06/28/2020   Procedure: COLONOSCOPY WITH PROPOFOL;  Surgeon: Daneil Dolin, MD;  [facility];  Service: Endoscopy;  Laterality: N/A;  am appt   CORONARY ANGIOGRAPHY N/A 05/16/2018   Procedure: CORONARY ANGIOGRAPHY (CATH LAB);  Surgeon: Belva Crome, MD;  Location: [place] [facility];  Service: Cardiovascular;  Laterality: N/A;   CORONARY ANGIOPLASTY WITH STENT PLACEMENT  2015   ESOPHAGEAL DILATION N/A 07/05/2015   Procedure: ESOPHAGEAL DILATION;  Surgeon: Daneil Dolin, MD;  [facility];  Service: Endoscopy;  Laterality: N/A;   ESOPHAGOGASTRODUODENOSCOPY (EGD) WITH PROPOFOL N/A 07/05/2015   [id number]   ESOPHAGOGASTRODUODENOSCOPY (EGD) WITH PROPOFOL N/A 02/16/2020   Procedure: ESOPHAGOGASTRODUODENOSCOPY (EGD) WITH PROPOFOL;  Surgeon: Daneil Dolin, MD;  Normal examined esophagus s/p dilation, erythematous mucosa in the stomach s/p biopsy, normal examined duodenum. Pathology with mild gastropathy, negative for H.  Pylori.    GIVENS CAPSULE STUDY N/A 11/10/2020   Procedure: GIVENS CAPSULE STUDY;  Surgeon: Daneil Dolin, MD;  [facility];  Service: Endoscopy;  Laterality: N/A;  7:30am   JOINT REPLACEMENT Right 2007   right knee   left hand surgery     LEFT HEART CATHETERIZATION WITH CORONARY ANGIOGRAM N/A 05/14/2013   Procedure: LEFT HEART CATHETERIZATION WITH CORONARY ANGIOGRAM;  Surgeon: Blane Ohara, MD;  Location: [facility] CATH LAB;  Service: Cardiovascular;  Laterality: N/A;   left rotator cuff surgery     LUMBAR LAMINECTOMY/DECOMPRESSION MICRODISCECTOMY N/A 10/11/2012   Procedure: LUMBAR LAMINECTOMY/DECOMPRESSION MICRODISCECTOMY 2 LEVELS;  Surgeon: Floyce Stakes, MD;  Location: [place];  Service: Neurosurgery;  Laterality: N/A;  L3-4 L4-5 Laminectomy   LUMBAR WOUND DEBRIDEMENT N/A 09/27/2015   Procedure: Exploration of  Lumbar Wound w/ Repair CSF Leak/Lumbar Drain Placement;  Surgeon: Leeroy Cha, MD;  Location: [place] [facility]UR[facility] ORS;  Service: Neurosurgery;  Laterality: N/A;   MALONEY DILATION  03/16/2011   Gastritis. No H.pylori on bx. 69F maloney dilation with disruption of  occult cevical esophageal web   MALONEY DILATION N/A 02/16/2020   Procedure: MALONEY DILATION;  Surgeon: Daneil Dolin, MD;  [facility];  Service: Endoscopy;  Laterality: N/A;   NASAL SINUS SURGERY     right knee replacement  2007   right leg benign tumor     SHOULDER SURGERY Left    TEE WITHOUT CARDIOVERSION N/A 10/04/2017   Procedure: TRANSESOPHAGEAL ECHOCARDIOGRAM (TEE) WITH PROPOFOL;  Surgeon: Herminio Commons, MD;  [facility];  Service: Cardiovascular;  Laterality: N/A;   TEE WITHOUT CARDIOVERSION N/A 11/10/2019   Procedure: TRANSESOPHAGEAL ECHOCARDIOGRAM (TEE);  Surgeon: Josue Hector, MD;  Location: [facility] ENDOSCOPY;  Service: Cardiovascular;  Laterality: N/A;   TONSILLECTOMY     unspecified area, hysterectomy  1972   partial     SOCIAL HISTORY:  Social History   Socioeconomic History   Marital status: Divorced    Spouse name: Not on file   Number of children: 5   Years of education: Not on file   Highest education level: Not on file  Occupational History   Occupation: retired  Tobacco Use   Smoking status: Former    Packs/day: 1.00    Years: 64.00    Total pack years: 64.00    Types: Cigarettes    Start date: 12/24/1947    Quit date: 11/17/2001    Years since quitting: 20.1   Smokeless tobacco: Never   Tobacco comments:    Quit smoking in 2003  Vaping Use   Vaping Use: Never used  Substance and Sexual Activity   Alcohol use: Not Currently   Drug use: No   Sexual activity: Never  Other Topics Concern   Not on file  Social History Narrative   Divorced.   Sister had colon perforation & died from complications in [place], [place]   Social Determinants of Health   Financial Resource Strain: Not on  file  Food Insecurity: Not on file  Transportation Needs: Not on file  Physical Activity: Not on file  Stress: Not on file  Social Connections: Not on file  Intimate Partner Violence: Not on file    FAMILY HISTORY:  Family History  Problem Relation Age of Onset   Stroke Mother    Parkinson's disease Father    Coronary artery disease Other        family Hx-sons   Cancer Other  Stroke Other        family Hx   Hypertension Other        family Hx   Diabetes Brother    Heart disease Son        before age 64   Diabetes Son    Stroke Daughter 34   Colon cancer Grandson        diagnosed 53   Inflammatory bowel disease Neg Hx     CURRENT MEDICATIONS:  Outpatient Encounter Medications as of 01/03/2022  Medication Sig   acetaminophen (TYLENOL) 500 MG tablet Take 500 mg by mouth every 6 (six) hours as needed for headache or moderate pain.   ALPRAZolam (XANAX) 0.5 MG tablet Take 0.5 mg by mouth daily as needed for anxiety or sleep.   apixaban (ELIQUIS) 5 MG TABS tablet Take 1 tablet (5 mg total) by mouth 2 (two) times daily.   ascorbic acid (VITAMIN C) 500 MG tablet Take 1 tablet (500 mg total) by mouth daily.   dexamethasone (DECADRON) 4 MG tablet Take 8 mg by mouth daily.   dexamethasone (DECADRON) 4 MG tablet Take 2 tablets (8 mg total) by mouth daily.   diltiazem (CARDIZEM CD) 120 MG 24 hr capsule TAKE ONE CAPSULE BY MOUTH DAILY   DULoxetine (CYMBALTA) 60 MG capsule Take 60 mg by mouth at bedtime.   fluticasone (CUTIVATE) 0.05 % cream Apply 1 application topically 2 (two) times daily as needed (skin tears).   furosemide (LASIX) 40 MG tablet Take 1 tablet (40 mg total) by mouth daily.   hydrocortisone (ANUSOL-HC) 2.5 % rectal cream Place 1 application rectally 2 (two) times daily.   isosorbide mononitrate (IMDUR) 60 MG 24 hr tablet Take 60 mg by mouth daily.   magnesium oxide (MAG-OX) 400 (240 Mg) MG tablet Take 1 tablet by mouth daily.   metoprolol succinate (TOPROL-XL) 25 MG  24 hr tablet Take 75 mg by mouth daily.   Multiple Vitamins-Minerals (HAIR/SKIN/NAILS) CAPS Take 1 tablet by mouth daily.   Naphazoline HCl (CLEAR EYES OP) Place 1 drop into both eyes daily as needed (itching).   nitroGLYCERIN (NITROSTAT) 0.4 MG SL tablet DISSOLVE ONE TABLET UNDER TONGUE EVERY 5 MINUTES UP TO 3 DOSES AS NEEDED FOR CHEST PAIN.   pantoprazole (PROTONIX) 40 MG tablet TAKE ONE TABLET BY MOUTH TWICE DAILY BEFORE MEALS   potassium chloride (MICRO-K) 10 MEQ CR capsule Take 10 mEq by mouth daily.   traMADol (ULTRAM) 50 MG tablet Take 1 tablet (50 mg total) by mouth every 6 (six) hours as needed.   triamcinolone cream (KENALOG) 0.1 % Apply topically 2 (two) times daily.   zinc sulfate 220 (50 Zn) MG capsule Take 1 capsule (220 mg total) by mouth daily.   No facility-administered encounter medications on file as of 01/03/2022.    ALLERGIES:  Allergies  Allergen Reactions   Cephalosporins Diarrhea and Nausea Only    Lightheaded   Levaquin [Levofloxacin In D5w] Swelling   Macrodantin [Nitrofurantoin Macrocrystal] Swelling   Phenothiazines Anaphylaxis and Hives   Polysorbate Anaphylaxis   Prednisone Shortness Of Breath   Buspirone Itching   Cardura [Doxazosin Mesylate] Itching   Codeine Itching   Acyclovir And Related Itching    Redness of skin   Colcrys [Colchicine] Nausea Only    Upset stomach    Prochlorperazine Other (See Comments)    "Upset stomach"   Ranexa [Ranolazine]     Severe drop in BP   Atorvastatin Hives    Cramping; tolerates Crestor ok  Colestipol Palpitations   Ofloxacin Rash   Other Itching and Rash    "WOOL"= make skin look like it has been burned   Penicillins Other (See Comments)    Causes redness all over. Has patient had a PCN reaction causing immediate rash, facial/tongue/throat swelling, SOB or lightheadedness with hypotension: No Has patient had a PCN reaction causing severe rash involving mucus membranes or skin necrosis: No Has patient had a  PCN reaction that required hospitalization No Has patient had a PCN reaction occurring within the last 10 years: No If all of the above answers are "NO", then may proceed with Cephalosporin use.    Pimozide Hives and Itching     PHYSICAL EXAM:  ECOG PERFORMANCE STATUS: 2 - Symptomatic, <50% confined to bed  There were no vitals filed for this visit. There were no vitals filed for this visit. Physical Exam Constitutional:      Appearance: Normal appearance. [name] is obese.  HENT:     Head: Normocephalic and atraumatic.     Mouth/Throat:     Mouth: Mucous membranes are moist.  Eyes:     Extraocular Movements: Extraocular movements intact.     Pupils: Pupils are equal, round, and reactive to light.  Cardiovascular:     Rate and Rhythm: Normal rate. Rhythm irregular.     Pulses: Normal pulses.     Heart sounds: Normal heart sounds.  Pulmonary:     Effort: Pulmonary effort is normal.     Breath sounds: Normal breath sounds.  Abdominal:     General: Bowel sounds are normal.     Palpations: Abdomen is soft.     Tenderness: There is no abdominal tenderness.  Musculoskeletal:        General: No swelling.     Right lower leg: No edema.     Left lower leg: No edema.  Lymphadenopathy:     Cervical: No cervical adenopathy.  Skin:    General: Skin is warm and dry.     Findings: Bruising present.  Neurological:     General: No focal deficit present.     Mental Status: [name] is alert and oriented to person, place, and time.  Psychiatric:        Mood and Affect: Mood normal.        Behavior: Behavior normal.     LABORATORY DATA:  I have reviewed the labs as listed.  CBC    Component Value Date/Time   WBC 9.7 12/27/2021 1333   RBC 4.20 12/27/2021 1333   HGB 13.6 12/27/2021 1333   HCT 39.7 12/27/2021 1333   PLT 291 12/27/2021 1333   MCV 94.5 12/27/2021 1333   MCH 32.4 12/27/2021 1333   MCHC 34.3 12/27/2021 1333   RDW 14.2 12/27/2021 1333   LYMPHSABS 2.7 12/27/2021 1333    MONOABS 0.8 12/27/2021 1333   EOSABS 0.1 12/27/2021 1333   BASOSABS 0.1 12/27/2021 1333      Latest Ref Rng & Units 12/14/2020    3:15 PM 12/07/2020    3:56 PM 11/17/2020    1:34 PM  CMP  Glucose 70 - 99 mg/dL  155  152   BUN 8 - 23 mg/dL  24  17   Creatinine 0.44 - 1.00 mg/dL  1.27  1.09   Sodium 135 - 145 mmol/L  135  133   Potassium 3.5 - 5.1 mmol/L  3.7  3.5   Chloride 98 - 111 mmol/L  96  97   CO2 22 - 32  mmol/L  28  26   Calcium 8.9 - 10.3 mg/dL  9.5  8.7   Total Protein 6.0 - 8.5 g/dL 7.4  7.6  7.3   Total Bilirubin 0.3 - 1.2 mg/dL  0.5  0.7   Alkaline Phos 38 - 126 U/L  99  123   AST 15 - 41 U/L  26  19   ALT 0 - 44 U/L  17  17     DIAGNOSTIC IMAGING:  I have independently reviewed the relevant imaging and discussed with the patient.  ASSESSMENT & PLAN: 1.  Iron deficiency anemia due to chronic GI blood loss - Iron deficiency anemia secondary to GI blood loss from hemorrhoids - [name] follows with GI, was also evaluated by general surgery but is not a candidate for surgical intervention or banding - Colonoscopy (06/28/2020): Internal and external hemorrhoids - EGD (02/16/2020): Normal esophagus, erythematous mucosa of the stomach, normal duodenum - [name] took oral iron supplementation without improvement, and with worsening constipation - Last iron was Feraheme x2 on 08/30/2021 and 09/06/2021 - [name] has had worsening fatigue and dyspnea on exertion for the past several months.   - Intermittent chest pain associated with left arm numbness, follows with cardiology. -  [name] has varying levels of hematochezia with every bowel movement. - Most recent labs (12/27/2021): Hgb 13.6/MCV 94.5, ferritin 263, iron saturation 30% - PLAN: No indication for IV iron at this time - Repeat labs and RTC in 4 months followed by office visit  - We will defer management of hemorrhoids to gastroenterology and surgery.   All questions were answered. The patient knows to call the clinic with any problems,  questions or concerns.  Medical decision making: Low  Time spent on visit: I spent 15 minutes counseling the patient face to face. The total time spent in the appointment was 22 minutes and more than 50% was on counseling.   Harriett Rush, PA-C  01/03/22 2:47 PM

## 2022-01-03 ENCOUNTER — Inpatient Hospital Stay (HOSPITAL_BASED_OUTPATIENT_CLINIC_OR_DEPARTMENT_OTHER): Payer: PPO | Admitting: Physician Assistant

## 2022-01-03 VITALS — BP 118/79 | HR 85 | Temp 97.9°F | Resp 18 | Ht 61.0 in | Wt 157.7 lb

## 2022-01-03 DIAGNOSIS — D500 Iron deficiency anemia secondary to blood loss (chronic): Secondary | ICD-10-CM | POA: Diagnosis not present

## 2022-01-03 NOTE — Patient Instructions (Signed)
[place] at [facility] Discharge Instructions  You were seen today by Tarri Abernethy PA-C for your iron deficiency anemia.  Your iron levels and blood levels look great today!  You do not need any IV iron at this time.  We will check your labs and see you again in 4 months to see if you need more IV iron at that time.  LABS: Return in 4 months for repeat labs  FOLLOW-UP APPOINTMENT: Office visit in 4 months, after labs   Thank you for choosing [place] at [facility] to provide your oncology and hematology care.  To afford each patient quality time with our provider, please arrive at least 15 minutes before your scheduled appointment time.   If you have a lab appointment with the [facility] please come in thru the Main Entrance and check in at the main information desk.  You need to re-schedule your appointment should you arrive 10 or more minutes late.  We strive to give you quality time with our providers, and arriving late affects you and other patients whose appointments are after yours.  Also, if you no show three or more times for appointments you may be dismissed from the clinic at the providers discretion.     Again, thank you for choosing [facility].  Our hope is that these requests will decrease the amount of time that you wait before being seen by our physicians.       _____________________________________________________________  Should you have questions after your visit to [facility] please contact our office at [phone] and follow the prompts.  Our office hours are 8:00 a.m. and 4:30 p.m. Monday - Friday.  Please note that voicemails left after 4:00 p.m. may not be returned until the following business day.  We are closed weekends and major holidays.  You do have access to a nurse 24-7, just call the main number to the clinic [phone] and do not press any options, hold on the line and a  nurse will answer the phone.    For prescription refill requests, have your pharmacy contact our office and allow 72 hours.    Due to Covid, you will need to wear a mask upon entering the hospital. If you do not have a mask, a mask will be given to you at the Main Entrance upon arrival. For doctor visits, patients may have 1 support person age 86 or older with them. For treatment visits, patients can not have anyone with them due to social distancing guidelines and our immunocompromised population.

## 2022-01-18 DIAGNOSIS — J449 Chronic obstructive pulmonary disease, unspecified: Secondary | ICD-10-CM | POA: Diagnosis not present

## 2022-01-18 DIAGNOSIS — I5033 Acute on chronic diastolic (congestive) heart failure: Secondary | ICD-10-CM | POA: Diagnosis not present

## 2022-01-18 DIAGNOSIS — R300 Dysuria: Secondary | ICD-10-CM | POA: Diagnosis not present

## 2022-01-18 DIAGNOSIS — J309 Allergic rhinitis, unspecified: Secondary | ICD-10-CM | POA: Diagnosis not present

## 2022-01-18 DIAGNOSIS — I739 Peripheral vascular disease, unspecified: Secondary | ICD-10-CM | POA: Diagnosis not present

## 2022-01-18 DIAGNOSIS — K649 Unspecified hemorrhoids: Secondary | ICD-10-CM | POA: Diagnosis not present

## 2022-01-18 DIAGNOSIS — N183 Chronic kidney disease, stage 3 unspecified: Secondary | ICD-10-CM | POA: Diagnosis not present

## 2022-01-18 DIAGNOSIS — M109 Gout, unspecified: Secondary | ICD-10-CM | POA: Diagnosis not present

## 2022-01-18 DIAGNOSIS — I10 Essential (primary) hypertension: Secondary | ICD-10-CM | POA: Diagnosis not present

## 2022-01-18 DIAGNOSIS — M545 Low back pain, unspecified: Secondary | ICD-10-CM | POA: Diagnosis not present

## 2022-01-18 DIAGNOSIS — F419 Anxiety disorder, unspecified: Secondary | ICD-10-CM | POA: Diagnosis not present

## 2022-01-18 DIAGNOSIS — F329 Major depressive disorder, single episode, unspecified: Secondary | ICD-10-CM | POA: Diagnosis not present

## 2022-02-13 ENCOUNTER — Other Ambulatory Visit: Payer: Self-pay | Admitting: Cardiology

## 2022-03-13 DIAGNOSIS — I509 Heart failure, unspecified: Secondary | ICD-10-CM | POA: Diagnosis not present

## 2022-03-13 DIAGNOSIS — Z743 Need for continuous supervision: Secondary | ICD-10-CM | POA: Diagnosis not present

## 2022-03-13 DIAGNOSIS — Z885 Allergy status to narcotic agent status: Secondary | ICD-10-CM | POA: Diagnosis not present

## 2022-03-13 DIAGNOSIS — Z888 Allergy status to other drugs, medicaments and biological substances status: Secondary | ICD-10-CM | POA: Diagnosis not present

## 2022-03-13 DIAGNOSIS — I130 Hypertensive heart and chronic kidney disease with heart failure and stage 1 through stage 4 chronic kidney disease, or unspecified chronic kidney disease: Secondary | ICD-10-CM | POA: Diagnosis not present

## 2022-03-13 DIAGNOSIS — Z881 Allergy status to other antibiotic agents status: Secondary | ICD-10-CM | POA: Diagnosis not present

## 2022-03-13 DIAGNOSIS — Z79899 Other long term (current) drug therapy: Secondary | ICD-10-CM | POA: Diagnosis not present

## 2022-03-13 DIAGNOSIS — K219 Gastro-esophageal reflux disease without esophagitis: Secondary | ICD-10-CM | POA: Diagnosis not present

## 2022-03-13 DIAGNOSIS — Z043 Encounter for examination and observation following other accident: Secondary | ICD-10-CM | POA: Diagnosis not present

## 2022-03-13 DIAGNOSIS — E785 Hyperlipidemia, unspecified: Secondary | ICD-10-CM | POA: Diagnosis not present

## 2022-03-13 DIAGNOSIS — Z880 Allergy status to penicillin: Secondary | ICD-10-CM | POA: Diagnosis not present

## 2022-03-13 DIAGNOSIS — N189 Chronic kidney disease, unspecified: Secondary | ICD-10-CM | POA: Diagnosis not present

## 2022-03-13 DIAGNOSIS — S0990XA Unspecified injury of head, initial encounter: Secondary | ICD-10-CM | POA: Diagnosis not present

## 2022-03-13 DIAGNOSIS — R519 Headache, unspecified: Secondary | ICD-10-CM | POA: Diagnosis not present

## 2022-03-13 DIAGNOSIS — T68XXXA Hypothermia, initial encounter: Secondary | ICD-10-CM | POA: Diagnosis not present

## 2022-03-13 DIAGNOSIS — M549 Dorsalgia, unspecified: Secondary | ICD-10-CM | POA: Diagnosis not present

## 2022-03-13 DIAGNOSIS — F32A Depression, unspecified: Secondary | ICD-10-CM | POA: Diagnosis not present

## 2022-03-13 DIAGNOSIS — I4891 Unspecified atrial fibrillation: Secondary | ICD-10-CM | POA: Diagnosis not present

## 2022-03-13 DIAGNOSIS — M25552 Pain in left hip: Secondary | ICD-10-CM | POA: Diagnosis not present

## 2022-03-13 DIAGNOSIS — Z7901 Long term (current) use of anticoagulants: Secondary | ICD-10-CM | POA: Diagnosis not present

## 2022-03-13 DIAGNOSIS — J449 Chronic obstructive pulmonary disease, unspecified: Secondary | ICD-10-CM | POA: Diagnosis not present

## 2022-03-13 DIAGNOSIS — S7002XA Contusion of left hip, initial encounter: Secondary | ICD-10-CM | POA: Diagnosis not present

## 2022-05-04 ENCOUNTER — Inpatient Hospital Stay: Payer: PPO

## 2022-05-11 ENCOUNTER — Ambulatory Visit: Payer: PPO | Admitting: Physician Assistant

## 2022-05-15 ENCOUNTER — Other Ambulatory Visit: Payer: Self-pay | Admitting: Cardiology

## 2022-06-02 ENCOUNTER — Other Ambulatory Visit: Payer: Self-pay

## 2022-06-02 ENCOUNTER — Emergency Department (HOSPITAL_COMMUNITY): Payer: PPO

## 2022-06-02 ENCOUNTER — Encounter (HOSPITAL_COMMUNITY): Payer: Self-pay

## 2022-06-02 ENCOUNTER — Emergency Department (HOSPITAL_COMMUNITY)
Admission: EM | Admit: 2022-06-02 | Discharge: 2022-06-02 | Disposition: A | Discharge status: Home / Self Care | Payer: PPO | Attending: Student | Admitting: Student

## 2022-06-02 DIAGNOSIS — Z7901 Long term (current) use of anticoagulants: Secondary | ICD-10-CM | POA: Insufficient documentation

## 2022-06-02 DIAGNOSIS — Z8616 Personal history of COVID-19: Secondary | ICD-10-CM | POA: Insufficient documentation

## 2022-06-02 DIAGNOSIS — J449 Chronic obstructive pulmonary disease, unspecified: Secondary | ICD-10-CM | POA: Insufficient documentation

## 2022-06-02 DIAGNOSIS — R519 Headache, unspecified: Secondary | ICD-10-CM

## 2022-06-02 DIAGNOSIS — I2510 Atherosclerotic heart disease of native coronary artery without angina pectoris: Secondary | ICD-10-CM | POA: Diagnosis not present

## 2022-06-02 DIAGNOSIS — E039 Hypothyroidism, unspecified: Secondary | ICD-10-CM | POA: Diagnosis not present

## 2022-06-02 DIAGNOSIS — I130 Hypertensive heart and chronic kidney disease with heart failure and stage 1 through stage 4 chronic kidney disease, or unspecified chronic kidney disease: Secondary | ICD-10-CM | POA: Diagnosis not present

## 2022-06-02 DIAGNOSIS — R079 Chest pain, unspecified: Secondary | ICD-10-CM | POA: Insufficient documentation

## 2022-06-02 DIAGNOSIS — Z87891 Personal history of nicotine dependence: Secondary | ICD-10-CM | POA: Insufficient documentation

## 2022-06-02 DIAGNOSIS — I5043 Acute on chronic combined systolic (congestive) and diastolic (congestive) heart failure: Secondary | ICD-10-CM | POA: Insufficient documentation

## 2022-06-02 DIAGNOSIS — N184 Chronic kidney disease, stage 4 (severe): Secondary | ICD-10-CM | POA: Insufficient documentation

## 2022-06-02 DIAGNOSIS — R0789 Other chest pain: Secondary | ICD-10-CM

## 2022-06-02 DIAGNOSIS — Z20822 Contact with and (suspected) exposure to covid-19: Secondary | ICD-10-CM | POA: Diagnosis not present

## 2022-06-02 DIAGNOSIS — R5383 Other fatigue: Secondary | ICD-10-CM | POA: Insufficient documentation

## 2022-06-02 DIAGNOSIS — Z7951 Long term (current) use of inhaled steroids: Secondary | ICD-10-CM | POA: Insufficient documentation

## 2022-06-02 DIAGNOSIS — Z79899 Other long term (current) drug therapy: Secondary | ICD-10-CM | POA: Diagnosis not present

## 2022-06-02 LAB — CBC
HCT: 39 % (ref 36.0–46.0)
Hemoglobin: 13.1 g/dL (ref 12.0–15.0)
MCH: 31.2 pg (ref 26.0–34.0)
MCHC: 33.6 g/dL (ref 30.0–36.0)
MCV: 92.9 fL (ref 80.0–100.0)
Platelets: 356 10*3/uL (ref 150–400)
RBC: 4.2 MIL/uL (ref 3.87–5.11)
RDW: 13.4 % (ref 11.5–15.5)
WBC: 9.8 10*3/uL (ref 4.0–10.5)
nRBC: 0 % (ref 0.0–0.2)

## 2022-06-02 LAB — BASIC METABOLIC PANEL
Anion gap: 10 (ref 5–15)
BUN: 36 mg/dL — ABNORMAL HIGH (ref 8–23)
CO2: 25 mmol/L (ref 22–32)
Calcium: 9.1 mg/dL (ref 8.9–10.3)
Chloride: 99 mmol/L (ref 98–111)
Creatinine, Ser: 1.41 mg/dL — ABNORMAL HIGH (ref 0.44–1.00)
GFR, Estimated: 36 mL/min — ABNORMAL LOW (ref >60)
Glucose, Bld: 162 mg/dL — ABNORMAL HIGH (ref 70–99)
Potassium: 4.2 mmol/L (ref 3.5–5.1)
Sodium: 134 mmol/L — ABNORMAL LOW (ref 135–145)

## 2022-06-02 LAB — TROPONIN I (HIGH SENSITIVITY)
Troponin I (High Sensitivity): 6 ng/L (ref <18)
Troponin I (High Sensitivity): 6 ng/L (ref <18)

## 2022-06-02 LAB — RESP PANEL BY RT-PCR (RSV, FLU A&B, COVID)  RVPGX2
Influenza A by PCR: NEGATIVE
Influenza B by PCR: NEGATIVE
Resp Syncytial Virus by PCR: NEGATIVE
SARS Coronavirus 2 by RT PCR: NEGATIVE

## 2022-06-02 LAB — BRAIN NATRIURETIC PEPTIDE: B Natriuretic Peptide: 264 pg/mL — ABNORMAL HIGH (ref 0.0–100.0)

## 2022-06-02 MED ORDER — METOCLOPRAMIDE HCL 5 MG/ML IJ SOLN
10.0000 mg | Freq: Once | INTRAMUSCULAR | Status: AC
  Administered 2022-06-02: 10 mg via INTRAVENOUS
  Filled 2022-06-02: qty 2

## 2022-06-02 MED ORDER — DIPHENHYDRAMINE HCL 50 MG/ML IJ SOLN
25.0000 mg | Freq: Once | INTRAMUSCULAR | Status: AC
  Administered 2022-06-02: 25 mg via INTRAVENOUS
  Filled 2022-06-02: qty 1

## 2022-06-02 MED ORDER — LACTATED RINGERS IV BOLUS
1000.0000 mL | Freq: Once | INTRAVENOUS | Status: AC
  Administered 2022-06-02: 1000 mL via INTRAVENOUS

## 2022-06-02 NOTE — ED Provider Notes (Signed)
[facility] Provider Note  CSN: [id number] Arrival date & time: 06/02/22 1056  Chief Complaint(s) Chest Pain  HPI [name] is a 89 y.o. female with PMH A-fib, CHF, CKD 4, COPD, CAD, GERD, neuropathy who presents emergency department for evaluation of multiple complaints including chest pain, headache, generalized fatigue.  Patient states that the symptoms have been going on for many weeks but daughter states that she has had an acute exacerbation of the symptoms over the last 2 days.  States that the pain is primarily related to a headache that is left-sided and radiates from the left frontal region down the neck and into the chest.  Denies abdominal pain, nausea, vomiting or other systemic symptoms.   Past Medical History Past Medical History:  Diagnosis Date   Anxiety    Arthritis    Atrial fibrillation (HCC)    Bursitis    Left shoulder   Cataract    CHF (congestive heart failure) (HCC)    CKD (chronic kidney disease)    stage 3-4   COPD (chronic obstructive pulmonary disease) (HCC)    Coronary atherosclerosis of native coronary artery    a. s/p DES to LCx in 04/2013 b. cath in 11/2015 showing patent stent with 20% prox-LAD and 80% ostial RCA stenosis for which medical management was recommended due to small artery size   Depression    Diastolic heart failure (HCC)    EF 55-60%   Dysphagia, unspecified(787.20)    Dyspnea    Dysrhythmia    Essential hypertension    GERD (gastroesophageal reflux disease)    Hx Schatzki's ring, multiple EGD/ED last 01/06/2004   Gout    Headache    History of anemia    Hyperlipidemia    Internal hemorrhoids without mention of complication    MI (myocardial infarction) ([place]) 2006   Microscopic colitis 2003   Panic disorder without agoraphobia    Paresthesia    Pneumonia 12/2011   PVD (peripheral vascular disease) ([place])    S/P colonoscopy 09/27/2001   internal hemorrhoids, desc colon inflam  polyp, SB BX-chronic duodenitis, colitis   Sleep apnea    Thyroid disease    Patient Active Problem List   Diagnosis Date Noted   Iron deficiency anemia 12/08/2020   Hypoalbuminemia 09/30/2020   Dehydration 09/30/2020   Peripheral neuropathy 09/30/2020   COVID-19 virus infection 09/29/2020   Dark stools 12/12/2019   Odynophagia 12/12/2019   Pressure injury of skin 11/09/2019   Unstable angina (HCC) 11/08/2019   Atrial fibrillation with rapid ventricular response ([place])    Hypothyroidism 09/19/2019   AKI (acute kidney injury) ([place]) 10/01/2018   Persistent atrial fibrillation 12/30/2017   Acute on chronic combined systolic and diastolic heart failure ([place]) 12/30/2017   Hyperammonemia ([place]) 02/20/2017   Myoclonic jerking 02/20/2017   AF (paroxysmal atrial fibrillation) ([place]) 02/19/2017   Acute gout [id number]   Chronic systolic heart failure ([employer])    Chest pain 09/30/2016   Status post lumbar spine surgery for decompression of spinal cord 08/07/2016   Lumbosacral spondylosis with radiculopathy 08/01/2016   Preoperative clearance 07/05/2016   Cough with pseudowheeze typical of UACS  05/26/2016   Generalized weakness 04/26/2016   Nausea without vomiting 04/26/2016   Atrial fibrillation with RVR ([place]) 04/15/2016   DOE (dyspnea on exertion) 03/20/2016   Acute on chronic diastolic congestive heart failure ([place])    Chronic kidney disease, stage III (moderate) ([place]) 02/17/2016   Essential hypertension 02/17/2016  COPD (chronic obstructive pulmonary disease) ([place]) 02/17/2016   Acute bronchitis 12/18/2015   CAD (coronary artery disease)    Chest pain at rest 10/15/2015   Chronic low back pain 10/15/2015   Hyponatremia 10/15/2015   Hyperglycemia 10/15/2015   Thrombocytosis 10/15/2015   Atypical chest pain 10/15/2015   Depression    Anxiety    Gastroesophageal reflux disease without esophagitis    Mild cognitive impairment 10/14/2015   Iron deficiency anemia due to chronic blood  loss    Meningitis 10/01/2015   Coronary artery disease due to lipid rich plaque    NSVT (nonsustained ventricular tachycardia) (HCC)    CSF leak 09/27/2015   Spondylolisthesis of lumbar region 09/24/2015   Gastric polyp    History of colonic polyps    Chronic diarrhea    Diarrhea 04/01/2015   Esophageal dysphagia 04/01/2015   PAOD (peripheral arterial occlusive disease) ([place]) 03/05/2015   Pain in the chest    Hyperlipidemia    Weight gain 08/12/2014   Anemia 08/07/2014   Hemorrhoids 08/06/2014   CHF (congestive heart failure) ([place]) 07/29/2014   Rectal bleeding 07/06/2014   Constipation 07/06/2014   Lower extremity edema 11/24/2013   Acute kidney injury superimposed on CKD ([place]) 11/24/2013   Hypokalemia 11/24/2013   Other and unspecified angina pectoris 05/14/2013   Hematochezia 02/13/2011   Lower abdominal pain 02/13/2011   Major depression ([place]) 09/28/2010   FATIGUE 04/13/2009   Chronic diastolic heart failure ([place]) 11/30/2008   DYSPNEA 11/30/2008   CAD, NATIVE VESSEL - PCI + DES to left circumflex 05/14/13 11/27/2008   Peripheral vascular disease ([place]) 11/27/2008   PANIC ATTACK 02/28/2008   MI 02/28/2008   Internal hemorrhoids 02/28/2008   COLITIS 02/28/2008   Dysphagia 02/28/2008   Home Medication(s) Prior to Admission medications   Medication Sig Start Date End Date Taking? Authorizing Provider  acetaminophen (TYLENOL) 500 MG tablet Take 500 mg by mouth every 6 (six) hours as needed for headache or moderate pain.    [provider]  ALPRAZolam Duanne Moron) 0.5 MG tablet Take 0.5 mg by mouth daily as needed for anxiety or sleep. 09/29/19   [provider]  apixaban (ELIQUIS) 5 MG TABS tablet Take 1 tablet (5 mg total) by mouth 2 (two) times daily. 07/14/19   Evans Lance, MD  ascorbic acid (VITAMIN C) 500 MG tablet Take 1 tablet (500 mg total) by mouth daily. 10/02/20   Roxan Hockey, MD  dexamethasone (DECADRON) 4 MG tablet Take 8 mg by mouth daily.  05/18/21   [provider]  dexamethasone (DECADRON) 4 MG tablet Take 2 tablets (8 mg total) by mouth daily. 11/16/21   Arnoldo Lenis, MD  diltiazem (CARDIZEM CD) 120 MG 24 hr capsule TAKE ONE CAPSULE BY MOUTH DAILY 12/12/21   Arnoldo Lenis, MD  DULoxetine (CYMBALTA) 60 MG capsule Take 60 mg by mouth at bedtime.    [provider]  fluticasone (CUTIVATE) 0.05 % cream Apply 1 application topically 2 (two) times daily as needed (skin tears). 07/15/19   [provider]  furosemide (LASIX) 40 MG tablet TAKE 1 AND 1/2 TABLETS BY MOUTH DAILY 02/13/22   Arnoldo Lenis, MD  hydrocortisone (ANUSOL-HC) 2.5 % rectal cream Place 1 application rectally 2 (two) times daily. 12/12/19   Erenest Rasher, PA-C  isosorbide mononitrate (IMDUR) 60 MG 24 hr tablet Take 60 mg by mouth daily.    [provider]  magnesium oxide (MAG-OX) 400 (240 Mg) MG tablet Take 1  tablet by mouth daily. 08/03/21   [provider]  metoprolol succinate (TOPROL-XL) 25 MG 24 hr tablet Take 75 mg by mouth daily.    [provider]  Multiple Vitamins-Minerals (HAIR/SKIN/NAILS) CAPS Take 1 tablet by mouth daily.    [provider]  Naphazoline HCl (CLEAR EYES OP) Place 1 drop into both eyes daily as needed (itching).    [provider]  nitroGLYCERIN (NITROSTAT) 0.4 MG SL tablet DISSOLVE ONE TABLET UNDER TONGUE EVERY 5 MINUTES UP TO 3 DOSES AS NEEDED FOR CHEST PAIN. Patient not taking: Reported on 01/03/2022 12/09/20   Arnoldo Lenis, MD  pantoprazole (PROTONIX) 40 MG tablet TAKE ONE TABLET BY MOUTH TWICE DAILY BEFORE MEALS 05/11/21   Erenest Rasher, PA-C  potassium chloride (MICRO-K) 10 MEQ CR capsule Take 10 mEq by mouth daily. 08/03/21   [provider]  traMADol (ULTRAM) 50 MG tablet Take 1 tablet (50 mg total) by mouth every 6 (six) hours as needed. 12/14/20   Sater, Nanine Means, MD  triamcinolone cream (KENALOG) 0.1 % Apply topically 2 (two) times  daily. 08/26/20   [provider]  zinc sulfate 220 (50 Zn) MG capsule Take 1 capsule (220 mg total) by mouth daily. 10/02/20   Roxan Hockey, MD                                                                                                                                    Past Surgical History Past Surgical History:  Procedure Laterality Date   ABDOMINAL HYSTERECTOMY     ABDOMINAL HYSTERECTOMY     AGILE CAPSULE N/A 08/30/2020   Procedure: AGILE CAPSULE;  Surgeon: Daneil Dolin, MD;  [facility];  Service: Endoscopy;  Laterality: N/A;  7:30am   ANTERIOR AND POSTERIOR REPAIR     with resection of vagina   ANTERIOR LAT LUMBAR FUSION N/A 08/01/2016   Procedure: Lumbar Two-Lumbar Five Transpsoas lateral interbody fusion with Lumbar Two-Three lateral plate fixation;  Surgeon: Kevan Ny Ditty, MD;  Location: [facility];  Service: Neurosurgery;  Laterality: N/A;  L2-5 Transpsoas lateral interbody fusion with L2-3 lateral plate fixation   APPENDECTOMY     BACK SURGERY     BIOPSY  07/05/2015   Procedure: BIOPSY;  Surgeon: Daneil Dolin, MD;  [facility];  Service: Endoscopy;;  gastric polyp biopsy, ascending colon biopsy   BIOPSY  02/16/2020   Procedure: BIOPSY;  Surgeon: Daneil Dolin, MD;  [facility];  Service: Endoscopy;;   BIOPSY  06/28/2020   Procedure: BIOPSY;  Surgeon: Daneil Dolin, MD;  [facility];  Service: Endoscopy;;   BLADDER SUSPENSION  11/09/2011   Procedure: TRANSVAGINAL TAPE (TVT) PROCEDURE;  Surgeon: Marissa Nestle, MD;  Location: [facility];  Service: Urology;  Laterality: N/A;   bladder tack  06/2010   BREAST LUMPECTOMY  1998   left, benign  CARDIAC CATHETERIZATION     CARDIAC CATHETERIZATION     CARDIAC CATHETERIZATION N/A 12/16/2015   Procedure: Left Heart Cath and Coronary Angiography;  Surgeon: Troy Sine, MD;  Location: [facility];  Service: Cardiovascular;  Laterality: N/A;   CARDIOVERSION N/A  10/04/2017   Procedure: CARDIOVERSION;  Surgeon: Herminio Commons, MD;  [facility];  Service: Cardiovascular;  Laterality: N/A;   CARDIOVERSION N/A 01/30/2018   Procedure: CARDIOVERSION;  Surgeon: Herminio Commons, MD;  [facility];  Service: Cardiovascular;  Laterality: N/A;   CARDIOVERSION N/A 11/10/2019   Procedure: CARDIOVERSION;  Surgeon: Josue Hector, MD;  Location: [facility] ENDOSCOPY;  Service: Cardiovascular;  Laterality: N/A;   [place]   left   cataract surgery     CHOLECYSTECTOMY  1998   Cholecystectomy     COLONOSCOPY  03/16/2011   multiple hyperplastic colon polyps, sigmoid diverticulosis, melanosis coli   COLONOSCOPY WITH PROPOFOL N/A 07/05/2015   RMR:one 5 mm polyp in descending colon   COLONOSCOPY WITH PROPOFOL N/A 06/28/2020   Procedure: COLONOSCOPY WITH PROPOFOL;  Surgeon: Daneil Dolin, MD;  [facility];  Service: Endoscopy;  Laterality: N/A;  am appt   CORONARY ANGIOGRAPHY N/A 05/16/2018   Procedure: CORONARY ANGIOGRAPHY (CATH LAB);  Surgeon: Belva Crome, MD;  Location: [facility];  Service: Cardiovascular;  Laterality: N/A;   CORONARY ANGIOPLASTY WITH STENT PLACEMENT  2015   ESOPHAGEAL DILATION N/A 07/05/2015   Procedure: ESOPHAGEAL DILATION;  Surgeon: Daneil Dolin, MD;  [facility];  Service: Endoscopy;  Laterality: N/A;   ESOPHAGOGASTRODUODENOSCOPY (EGD) WITH PROPOFOL N/A 07/05/2015   [id number]   ESOPHAGOGASTRODUODENOSCOPY (EGD) WITH PROPOFOL N/A 02/16/2020   Procedure: ESOPHAGOGASTRODUODENOSCOPY (EGD) WITH PROPOFOL;  Surgeon: Daneil Dolin, MD;  Normal examined esophagus s/p dilation, erythematous mucosa in the stomach s/p biopsy, normal examined duodenum. Pathology with mild gastropathy, negative for H. Pylori.    GIVENS CAPSULE STUDY N/A 11/10/2020   Procedure: GIVENS CAPSULE STUDY;  Surgeon: Daneil Dolin, MD;  [facility];  Service: Endoscopy;  Laterality: N/A;  7:30am    JOINT REPLACEMENT Right 2007   right knee   left hand surgery     LEFT HEART CATHETERIZATION WITH CORONARY ANGIOGRAM N/A 05/14/2013   Procedure: LEFT HEART CATHETERIZATION WITH CORONARY ANGIOGRAM;  Surgeon: Blane Ohara, MD;  Location: [facility] CATH LAB;  Service: Cardiovascular;  Laterality: N/A;   left rotator cuff surgery     LUMBAR LAMINECTOMY/DECOMPRESSION MICRODISCECTOMY N/A 10/11/2012   Procedure: LUMBAR LAMINECTOMY/DECOMPRESSION MICRODISCECTOMY 2 LEVELS;  Surgeon: Floyce Stakes, MD;  Location: [facility];  Service: Neurosurgery;  Laterality: N/A;  L3-4 L4-5 Laminectomy   LUMBAR WOUND DEBRIDEMENT N/A 09/27/2015   Procedure: Exploration of Lumbar Wound w/ Repair CSF Leak/Lumbar Drain Placement;  Surgeon: Leeroy Cha, MD;  Location: [facility];  Service: Neurosurgery;  Laterality: N/A;   MALONEY DILATION  03/16/2011   Gastritis. No H.pylori on bx. 18F maloney dilation with disruption of  occult cevical esophageal web   MALONEY DILATION N/A 02/16/2020   Procedure: MALONEY DILATION;  Surgeon: Daneil Dolin, MD;  [facility];  Service: Endoscopy;  Laterality: N/A;   NASAL SINUS SURGERY     right knee replacement  2007   right leg benign tumor     SHOULDER SURGERY Left    TEE WITHOUT CARDIOVERSION N/A 10/04/2017   Procedure: TRANSESOPHAGEAL ECHOCARDIOGRAM (TEE) WITH PROPOFOL;  Surgeon: Kate Sable  A, MD;  Location[facility];  Service: Cardiovascular;  Laterality: N/A;   TEE WITHOUT CARDIOVERSION N/A 11/10/2019   Procedure: TRANSESOPHAGEAL ECHOCARDIOGRAM (TEE);  Surgeon: Josue Hector, MD;  Location: [facility] ENDOSCOPY;  Service: Cardiovascular;  Laterality: N/A;   TONSILLECTOMY     unspecified area, hysterectomy  1972   partial   Family History Family History  Problem Relation Age of Onset   Stroke Mother    Parkinson's disease Father    Coronary artery disease Other        family Hx-sons   Cancer Other    Stroke Other        family Hx   Hypertension Other         family Hx   Diabetes Brother    Heart disease Son        before age 87   Diabetes Son    Stroke Daughter 84   Colon cancer Grandson        diagnosed 37   Inflammatory bowel disease Neg Hx     Social History Social History   Tobacco Use   Smoking status: Former    Packs/day: 1.00    Years: 64.00    Total pack years: 64.00    Types: Cigarettes    Start date: 12/24/1947    Quit date: 11/17/2001    Years since quitting: 20.5   Smokeless tobacco: Never   Tobacco comments:    Quit smoking in 2003  Vaping Use   Vaping Use: Never used  Substance Use Topics   Alcohol use: Not Currently   Drug use: No   Allergies Cephalosporins, Levaquin [levofloxacin in d5w], Macrodantin [nitrofurantoin macrocrystal], Phenothiazines, Polysorbate, Prednisone, Buspirone, Cardura [doxazosin mesylate], Codeine, Acyclovir and related, Colcrys [colchicine], Prochlorperazine, Ranexa [ranolazine], Atorvastatin, Colestipol, Ofloxacin, Other, Penicillins, and Pimozide  Review of Systems Review of Systems  Constitutional:  Positive for fatigue.  Cardiovascular:  Positive for chest pain.  Neurological:  Positive for headaches.    Physical Exam Vital Signs  I have reviewed the triage vital signs BP 113/61 (BP Location: Right Arm)   Pulse (!) 158   Temp 98.1 F (36.7 C) (Oral)   Resp 18   Ht 5' 1"$  (1.549 m)   Wt 68.9 kg   SpO2 97%   BMI 28.72 kg/m   Physical Exam Vitals and nursing note reviewed.  Constitutional:      General: She is not in acute distress.    Appearance: She is well-developed.  HENT:     Head: Normocephalic and atraumatic.  Eyes:     Conjunctiva/sclera: Conjunctivae normal.  Cardiovascular:     Rate and Rhythm: Normal rate and regular rhythm.     Heart sounds: No murmur heard. Pulmonary:     Effort: Pulmonary effort is normal. No respiratory distress.     Breath sounds: Normal breath sounds.  Abdominal:     Palpations: Abdomen is soft.     Tenderness: There is no  abdominal tenderness.  Musculoskeletal:        General: No swelling.     Cervical back: Neck supple.  Skin:    General: Skin is warm and dry.     Capillary Refill: Capillary refill takes less than 2 seconds.  Neurological:     Mental Status: She is alert.  Psychiatric:        Mood and Affect: Mood normal.     ED Results and Treatments Labs (all labs ordered are listed, but only abnormal results are displayed) Labs Reviewed  RESP PANEL BY RT-PCR (RSV, FLU A&B, COVID)  [id number]  BASIC METABOLIC PANEL  CBC  TROPONIN I (HIGH SENSITIVITY)                                                                                                                          Radiology No results found.  Pertinent labs & imaging results that were available during my care of the patient were reviewed by me and considered in my medical decision making (see MDM for details).  Medications Ordered in ED Medications  metoCLOPramide (REGLAN) injection 10 mg (has no administration in time range)  diphenhydrAMINE (BENADRYL) injection 25 mg (has no administration in time range)  lactated ringers bolus 1,000 mL (has no administration in time range)                                                                                                                                     Procedures Procedures  (including critical care time)  Medical Decision Making / ED Course   This patient presents to the ED for concern of headache, chest pain, this involves an extensive number of treatment options, and is a complaint that carries with it a high risk of complications and morbidity.  The differential diagnosis includes overdiuresis with dehydration headache, tension headache, intracranial hemorrhage,, migraine, viral URI, ACS, pulmonary edema  MDM: Patient seen emergency room for evaluation of multiple complaints described above.  Physical exam largely unremarkable with no focal motor or sensory deficits, no  appreciable numbness, tingling, weakness or other neurologic findings.  Cardiopulmonary exam unremarkable.  Laboratory evaluation with a BUN of 36, creatinine 1.41 which is slightly elevated from baseline and patient received fluid resuscitation.  COVID, flu, RSV all negative and obtained in the setting of headache of unknown origin.  CT head unremarkable, BNP elevated to 264 but this is downtrending from previous and chest x-ray showing signs of mild interstitial edema but patient is not requiring supplemental oxygen here in the ER today.  Patient received a headache cocktail and on reevaluation her symptoms completely resolved.  Suspect a small amount of overdiuresis and dehydration today, but as the patient's symptoms have resolved and she is hemodynamically stable, she does not meet inpatient criteria for admission she is safe for discharge with outpatient follow-up.   Additional history obtained: -Additional history obtained from daughter -External records from outside source obtained and reviewed  including: Chart review including previous notes, labs, imaging, consultation notes   Lab Tests: -I ordered, reviewed, and interpreted labs.   The pertinent results include:   Labs Reviewed  RESP PANEL BY RT-PCR (RSV, FLU A&B, COVID)  [id number]  BASIC METABOLIC PANEL  CBC  TROPONIN I (HIGH SENSITIVITY)      EKG   EKG Interpretation  Date/Time:  Friday June 02 2022 11:13:23 EST Ventricular Rate:  84 PR Interval:    QRS Duration: 66 QT Interval:  390 QTC Calculation: 460 R Axis:   6 Text Interpretation: Atrial fibrillation Abnormal ECG When compared with ECG of 17-Nov-2020 11:55, No significant change was found Confirmed by [place] ([id number]) on 06/02/2022 2:22:36 PM         Imaging Studies ordered: I ordered imaging studies including chest x-ray, CT head I independently visualized and interpreted imaging. I agree with the radiologist interpretation   Medicines ordered and  prescription drug management: Meds ordered this encounter  Medications   metoCLOPramide (REGLAN) injection 10 mg   diphenhydrAMINE (BENADRYL) injection 25 mg   lactated ringers bolus 1,000 mL    -I have reviewed the patients home medicines and have made adjustments as needed  Critical interventions none   Cardiac Monitoring: The patient was maintained on a cardiac monitor.  I personally viewed and interpreted the cardiac monitored which showed an underlying rhythm of: A-fib  Social Determinants of Health:  Factors impacting patients care include: none   Reevaluation: After the interventions noted above, I reevaluated the patient and found that they have :improved  Co morbidities that complicate the patient evaluation  Past Medical History:  Diagnosis Date   Anxiety    Arthritis    Atrial fibrillation ([place])    Bursitis    Left shoulder   Cataract    CHF (congestive heart failure) (HCC)    CKD (chronic kidney disease)    stage 3-4   COPD (chronic obstructive pulmonary disease) ([place])    Coronary atherosclerosis of native coronary artery    a. s/p DES to LCx in 04/2013 b. cath in 11/2015 showing patent stent with 20% prox-LAD and 80% ostial RCA stenosis for which medical management was recommended due to small artery size   Depression    Diastolic heart failure (HCC)    EF 55-60%   Dysphagia, unspecified(787.20)    Dyspnea    Dysrhythmia    Essential hypertension    GERD (gastroesophageal reflux disease)    Hx Schatzki's ring, multiple EGD/ED last 01/06/2004   Gout    Headache    History of anemia    Hyperlipidemia    Internal hemorrhoids without mention of complication    MI (myocardial infarction) ([place]) 2006   Microscopic colitis 2003   Panic disorder without agoraphobia    Paresthesia    Pneumonia 12/2011   PVD (peripheral vascular disease) ([place])    S/P colonoscopy 09/27/2001   internal hemorrhoids, desc colon inflam polyp, SB BX-chronic duodenitis, colitis    Sleep apnea    Thyroid disease       Dispostion: I considered admission for this patient, but at this time she does not meet inpatient criteria for admission she is safe for discharge with outpatient follow-up     Final Clinical Impression(s) / ED Diagnoses Final diagnoses:  None     @PCDICTATION$ @    Teressa Lower, MD 06/02/22 1423

## 2022-06-02 NOTE — ED Notes (Signed)
Patient reevaluated on the shortness of breath episode. Oxygen saturation 98% room air. Dr. Matilde Sprang order BNP to evaluate. Patient states comfortable at this time.

## 2022-06-02 NOTE — ED Notes (Signed)
Patient states she is pain free in chest and head at this time. Patient has no shortness of breath.

## 2022-06-02 NOTE — ED Triage Notes (Signed)
Pt complaining of her heartrate racing for the last 2 days, pain under the left breast, said her left leg is starting to tingle.

## 2022-06-02 NOTE — ED Notes (Signed)
Patient appeared to have shortness of breath and states feeling short of breath. Patient repositioned in bed and HOB elevated and oxygen saturation 96% room air. Lung sounds clear throughout lung fields. Dr. Matilde Sprang notified.

## 2022-06-05 ENCOUNTER — Ambulatory Visit: Payer: PPO | Admitting: Cardiology

## 2022-06-06 DIAGNOSIS — R300 Dysuria: Secondary | ICD-10-CM | POA: Diagnosis not present

## 2022-06-06 DIAGNOSIS — H9203 Otalgia, bilateral: Secondary | ICD-10-CM | POA: Diagnosis not present

## 2022-06-06 DIAGNOSIS — Z6830 Body mass index (BMI) 30.0-30.9, adult: Secondary | ICD-10-CM | POA: Diagnosis not present

## 2022-06-06 DIAGNOSIS — R519 Headache, unspecified: Secondary | ICD-10-CM | POA: Diagnosis not present

## 2022-06-06 DIAGNOSIS — I4891 Unspecified atrial fibrillation: Secondary | ICD-10-CM | POA: Diagnosis not present

## 2022-06-13 ENCOUNTER — Ambulatory Visit: Payer: PPO | Attending: Internal Medicine | Admitting: Internal Medicine

## 2022-06-13 ENCOUNTER — Encounter: Payer: Self-pay | Admitting: Internal Medicine

## 2022-06-13 VITALS — BP 134/68 | HR 88 | Ht 61.0 in | Wt 152.0 lb

## 2022-06-13 DIAGNOSIS — I4819 Other persistent atrial fibrillation: Secondary | ICD-10-CM

## 2022-06-13 MED ORDER — METOPROLOL SUCCINATE ER 50 MG PO TB24: 50.0000 mg | ORAL_TABLET | Freq: Two times a day (BID) | ORAL | 3 refills | Status: AC

## 2022-06-13 NOTE — Progress Notes (Signed)
HPI [name] returns today for followup. She is a pleasant 89 yo woman with PAF, NSVT, HTN, and chronic anti-coagulation. When I saw her in the past she had developed pain in her left foot and I was concerned about atheroemboli but by the time she saw Dr. Donnetta Hutching her toes had cleared up. No trauma. She has been on an [facility] for years.  She continues to have palpitations. She has burning and pain in her legs, worse at night. She has dyspnea. She continues to have palpitations.  Allergies  Allergen Reactions   Cephalosporins Diarrhea and Nausea Only    Lightheaded   Levaquin [Levofloxacin In D5w] Swelling   Macrodantin [Nitrofurantoin Macrocrystal] Swelling   Phenothiazines Anaphylaxis and Hives   Polysorbate Anaphylaxis   Prednisone Shortness Of Breath   Buspirone Itching   Cardura [Doxazosin Mesylate] Itching   Codeine Itching   Acyclovir And Related Itching    Redness of skin   Colcrys [Colchicine] Nausea Only    Upset stomach    Prochlorperazine Other (See Comments)    "Upset stomach"   Ranexa [Ranolazine]     Severe drop in BP   Atorvastatin Hives    Cramping; tolerates Crestor ok   Colestipol Palpitations   Ofloxacin Rash   Other Itching and Rash    "WOOL"= make skin look like it has been burned   Penicillins Other (See Comments)    Causes redness all over. Has patient had a PCN reaction causing immediate rash, facial/tongue/throat swelling, SOB or lightheadedness with hypotension: No Has patient had a PCN reaction causing severe rash involving mucus membranes or skin necrosis: No Has patient had a PCN reaction that required hospitalization No Has patient had a PCN reaction occurring within the last 10 years: No If all of the above answers are "NO", then may proceed with Cephalosporin use.    Pimozide Hives and Itching     Current Outpatient Medications  Medication Sig Dispense Refill   acetaminophen (TYLENOL) 500 MG tablet Take 500 mg by mouth every 6 (six)  hours as needed for headache or moderate pain.     ALPRAZolam (XANAX) 0.5 MG tablet Take 0.5 mg by mouth daily as needed for anxiety or sleep.     apixaban (ELIQUIS) 5 MG TABS tablet Take 1 tablet (5 mg total) by mouth 2 (two) times daily. 180 tablet 3   ascorbic acid (VITAMIN C) 500 MG tablet Take 1 tablet (500 mg total) by mouth daily. 30 tablet 2   dexamethasone (DECADRON) 4 MG tablet Take 2 tablets (8 mg total) by mouth daily. 10 tablet 0   diltiazem (CARDIZEM CD) 120 MG 24 hr capsule TAKE ONE CAPSULE BY MOUTH DAILY 90 capsule 3   DULoxetine (CYMBALTA) 60 MG capsule Take 60 mg by mouth at bedtime.     fluticasone (CUTIVATE) 0.05 % cream Apply 1 application topically 2 (two) times daily as needed (skin tears).     furosemide (LASIX) 40 MG tablet TAKE 1 AND 1/2 TABLETS BY MOUTH DAILY 135 tablet 3   hydrocortisone (ANUSOL-HC) 2.5 % rectal cream Place 1 application rectally 2 (two) times daily. 30 g 1   isosorbide mononitrate (IMDUR) 60 MG 24 hr tablet Take 60 mg by mouth daily.     magnesium oxide (MAG-OX) 400 (240 Mg) MG tablet Take 1 tablet by mouth daily.     metoprolol succinate (TOPROL-XL) 50 MG 24 hr tablet Take 1 tablet (50 mg total) by mouth 2 (two) times daily.  Take with or immediately following a meal. 180 tablet 3   Multiple Vitamins-Minerals (HAIR/SKIN/NAILS) CAPS Take 1 tablet by mouth daily.     Naphazoline HCl (CLEAR EYES OP) Place 1 drop into both eyes daily as needed (itching).     nitroGLYCERIN (NITROSTAT) 0.4 MG SL tablet DISSOLVE ONE TABLET UNDER TONGUE EVERY 5 MINUTES UP TO 3 DOSES AS NEEDED FOR CHEST PAIN. 25 tablet 6   pantoprazole (PROTONIX) 40 MG tablet TAKE ONE TABLET BY MOUTH TWICE DAILY BEFORE MEALS 60 tablet 5   potassium chloride (MICRO-K) 10 MEQ CR capsule Take 10 mEq by mouth daily.     traMADol (ULTRAM) 50 MG tablet Take 1 tablet (50 mg total) by mouth every 6 (six) hours as needed. 90 tablet 3   triamcinolone cream (KENALOG) 0.1 % Apply topically 2 (two) times  daily.     zinc sulfate 220 (50 Zn) MG capsule Take 1 capsule (220 mg total) by mouth daily. 30 capsule 3   dexamethasone (DECADRON) 4 MG tablet Take 8 mg by mouth daily.     No current facility-administered medications for this visit.     Past Medical History:  Diagnosis Date   Anxiety    Arthritis    Atrial fibrillation (HCC)    Bursitis    Left shoulder   Cataract    CHF (congestive heart failure) (HCC)    CKD (chronic kidney disease)    stage 3-4   COPD (chronic obstructive pulmonary disease) (HCC)    Coronary atherosclerosis of native coronary artery    a. s/p DES to LCx in 04/2013 b. cath in 11/2015 showing patent stent with 20% prox-LAD and 80% ostial RCA stenosis for which medical management was recommended due to small artery size   Depression    Diastolic heart failure (HCC)    EF 55-60%   Dysphagia, unspecified(787.20)    Dyspnea    Dysrhythmia    Essential hypertension    GERD (gastroesophageal reflux disease)    Hx Schatzki's ring, multiple EGD/ED last 01/06/2004   Gout    Headache    History of anemia    Hyperlipidemia    Internal hemorrhoids without mention of complication    MI (myocardial infarction) ([place]) 2006   Microscopic colitis 2003   Panic disorder without agoraphobia    Paresthesia    Pneumonia 12/2011   PVD (peripheral vascular disease) (HCC)    S/P colonoscopy 09/27/2001   internal hemorrhoids, desc colon inflam polyp, SB BX-chronic duodenitis, colitis   Sleep apnea    Thyroid disease     ROS:   All systems reviewed and negative except as noted in the HPI.   Past Surgical History:  Procedure Laterality Date   ABDOMINAL HYSTERECTOMY     ABDOMINAL HYSTERECTOMY     AGILE CAPSULE N/A 08/30/2020   Procedure: AGILE CAPSULE;  Surgeon: Daneil Dolin, MD;  [facility];  Service: Endoscopy;  Laterality: N/A;  7:30am   ANTERIOR AND POSTERIOR REPAIR     with resection of vagina   ANTERIOR LAT LUMBAR FUSION N/A 08/01/2016    Procedure: Lumbar Two-Lumbar Five Transpsoas lateral interbody fusion with Lumbar Two-Three lateral plate fixation;  Surgeon: Kevan Ny Ditty, MD;  Location: [facility];  Service: Neurosurgery;  Laterality: N/A;  L2-5 Transpsoas lateral interbody fusion with L2-3 lateral plate fixation   APPENDECTOMY     BACK SURGERY     BIOPSY  07/05/2015   Procedure: BIOPSY;  Surgeon: Daneil Dolin, MD;  [facility];  Service: Endoscopy;;  gastric polyp biopsy, ascending colon biopsy   BIOPSY  02/16/2020   Procedure: BIOPSY;  Surgeon: Daneil Dolin, MD;  [facility];  Service: Endoscopy;;   BIOPSY  06/28/2020   Procedure: BIOPSY;  Surgeon: Daneil Dolin, MD;  [facility];  Service: Endoscopy;;   BLADDER SUSPENSION  11/09/2011   Procedure: TRANSVAGINAL TAPE (TVT) PROCEDURE;  Surgeon: Marissa Nestle, MD;  Location: [facility];  Service: Urology;  Laterality: N/A;   bladder tack  06/2010   BREAST LUMPECTOMY  1998   left, benign   CARDIAC CATHETERIZATION     CARDIAC CATHETERIZATION     CARDIAC CATHETERIZATION N/A 12/16/2015   Procedure: Left Heart Cath and Coronary Angiography;  Surgeon: Troy Sine, MD;  Location: [place] [facility];  Service: Cardiovascular;  Laterality: N/A;   CARDIOVERSION N/A 10/04/2017   Procedure: CARDIOVERSION;  Surgeon: Herminio Commons, MD;  [facility];  Service: Cardiovascular;  Laterality: N/A;   CARDIOVERSION N/A 01/30/2018   Procedure: CARDIOVERSION;  Surgeon: Herminio Commons, MD;  [facility];  Service: Cardiovascular;  Laterality: N/A;   CARDIOVERSION N/A 11/10/2019   Procedure: CARDIOVERSION;  Surgeon: Josue Hector, MD;  Location: [facility] ENDOSCOPY;  Service: Cardiovascular;  Laterality: N/A;   [place]   left   cataract surgery     CHOLECYSTECTOMY  1998   Cholecystectomy     COLONOSCOPY  03/16/2011   multiple hyperplastic colon polyps, sigmoid diverticulosis, melanosis coli   COLONOSCOPY WITH  PROPOFOL N/A 07/05/2015   RMR:one 5 mm polyp in descending colon   COLONOSCOPY WITH PROPOFOL N/A 06/28/2020   Procedure: COLONOSCOPY WITH PROPOFOL;  Surgeon: Daneil Dolin, MD;  [facility];  Service: Endoscopy;  Laterality: N/A;  am appt   CORONARY ANGIOGRAPHY N/A 05/16/2018   Procedure: CORONARY ANGIOGRAPHY (CATH LAB);  Surgeon: Belva Crome, MD;  Location: [place] [facility];  Service: Cardiovascular;  Laterality: N/A;   CORONARY ANGIOPLASTY WITH STENT PLACEMENT  2015   ESOPHAGEAL DILATION N/A 07/05/2015   Procedure: ESOPHAGEAL DILATION;  Surgeon: Daneil Dolin, MD;  [facility];  Service: Endoscopy;  Laterality: N/A;   ESOPHAGOGASTRODUODENOSCOPY (EGD) WITH PROPOFOL N/A 07/05/2015   [id number]   ESOPHAGOGASTRODUODENOSCOPY (EGD) WITH PROPOFOL N/A 02/16/2020   Procedure: ESOPHAGOGASTRODUODENOSCOPY (EGD) WITH PROPOFOL;  Surgeon: Daneil Dolin, MD;  Normal examined esophagus s/p dilation, erythematous mucosa in the stomach s/p biopsy, normal examined duodenum. Pathology with mild gastropathy, negative for H. Pylori.    GIVENS CAPSULE STUDY N/A 11/10/2020   Procedure: GIVENS CAPSULE STUDY;  Surgeon: Daneil Dolin, MD;  [facility];  Service: Endoscopy;  Laterality: N/A;  7:30am   JOINT REPLACEMENT Right 2007   right knee   left hand surgery     LEFT HEART CATHETERIZATION WITH CORONARY ANGIOGRAM N/A 05/14/2013   Procedure: LEFT HEART CATHETERIZATION WITH CORONARY ANGIOGRAM;  Surgeon: Blane Ohara, MD;  [facility]: [facility] CATH LAB;  Service: Cardiovascular;  Laterality: N/A;   left rotator cuff surgery     LUMBAR LAMINECTOMY/DECOMPRESSION MICRODISCECTOMY N/A 10/11/2012   Procedure: LUMBAR LAMINECTOMY/DECOMPRESSION MICRODISCECTOMY 2 LEVELS;  Surgeon: Floyce Stakes, MD;  Location: [place] [facility];  Service: Neurosurgery;  Laterality: N/A;  L3-4 L4-5 Laminectomy   LUMBAR WOUND DEBRIDEMENT N/A 09/27/2015   Procedure: Exploration of Lumbar Wound w/ Repair CSF  Leak/Lumbar Drain Placement;  Surgeon: Leeroy Cha, MD;  Location: [place]  ORS;  Service: Neurosurgery;  Laterality: N/A;   MALONEY DILATION  03/16/2011   Gastritis. No H.pylori on bx. 62F maloney dilation with disruption of  occult cevical esophageal web   MALONEY DILATION N/A 02/16/2020   Procedure: MALONEY DILATION;  Surgeon: Daneil Dolin, MD;  [facility];  Service: Endoscopy;  Laterality: N/A;   NASAL SINUS SURGERY     right knee replacement  2007   right leg benign tumor     SHOULDER SURGERY Left    TEE WITHOUT CARDIOVERSION N/A 10/04/2017   Procedure: TRANSESOPHAGEAL ECHOCARDIOGRAM (TEE) WITH PROPOFOL;  Surgeon: Herminio Commons, MD;  [facility];  Service: Cardiovascular;  Laterality: N/A;   TEE WITHOUT CARDIOVERSION N/A 11/10/2019   Procedure: TRANSESOPHAGEAL ECHOCARDIOGRAM (TEE);  Surgeon: Josue Hector, MD;  Location: [facility] ENDOSCOPY;  Service: Cardiovascular;  Laterality: N/A;   TONSILLECTOMY     unspecified area, hysterectomy  1972   partial     Family History  Problem Relation Age of Onset   Stroke Mother    Parkinson's disease Father    Coronary artery disease Other        family Hx-sons   Cancer Other    Stroke Other        family Hx   Hypertension Other        family Hx   Diabetes Brother    Heart disease Son        before age 68   Diabetes Son    Stroke Daughter 95   Colon cancer Grandson        diagnosed 57   Inflammatory bowel disease Neg Hx      Social History   Socioeconomic History   Marital status: Divorced    Spouse name: Not on file   Number of children: 5   Years of education: Not on file   Highest education level: Not on file  Occupational History   Occupation: retired  Tobacco Use   Smoking status: Former    Packs/day: 1.00    Years: 64.00    Total pack years: 64.00    Types: Cigarettes    Start date: 12/24/1947    Quit date: 11/17/2001    Years since quitting: 20.5   Smokeless tobacco: Never   Tobacco  comments:    Quit smoking in 2003  Vaping Use   Vaping Use: Never used  Substance and Sexual Activity   Alcohol use: Not Currently   Drug use: No   Sexual activity: Never  Other Topics Concern   Not on file  Social History Narrative   Divorced.   Sister had colon perforation & died from complications in [place], [place]   Social Determinants of Health   Financial Resource Strain: Not on file  Food Insecurity: Not on file  Transportation Needs: Not on file  Physical Activity: Not on file  Stress: Not on file  Social Connections: Not on file  Intimate Partner Violence: Not on file     BP 134/68   Pulse 88   Ht '5\' 1"'$  (1.549 m)   Wt 152 lb (68.9 kg)   SpO2 95%   BMI 28.72 kg/m   Physical Exam:  Well appearing NAD HEENT: Unremarkable Neck:  No JVD, no thyromegally Lymphatics:  No adenopathy Back:  No CVA tenderness Lungs:  Clear HEART:  IRegular rate rhythm, no murmurs, no rubs, no clicks Abd:  soft, positive bowel sounds, no organomegally, no rebound, no guarding Ext:  2 plus pulses, no  edema, no cyanosis, no clubbing Skin:  No rashes no nodules Neuro:  CN II through XII intact, motor grossly intact  Assess/Plan: 1. Chronic diastolic heart failure - still not optimal. she will continue her lasix 2. NSVT/PAF - I have asked her to increase her toprol as she feels like her heart is racing more. 50 bid. If she remains symptomatic, I would have her wear a zio monitor. 3. Peripheral neuropathy - I encouraged her to reach out to her medical MD 4. Foot swelling - her toes are much improved. Swelling is much improved.     Carleene Overlie Nichael Ehly,MD

## 2022-06-13 NOTE — Patient Instructions (Signed)
Medication Instructions:   Increase Toprol XL to 50 mg Two Times Daily   *If you need a refill on your cardiac medications before your next appointment, please call your pharmacy*   Lab Work: NONE   If you have labs (blood work) drawn today and your tests are completely normal, you will receive your results only by: [place] (if you have MyChart) OR A paper copy in the mail If you have any lab test that is abnormal or we need to change your treatment, we will call you to review the results.   Testing/Procedures: NONE    Follow-Up: At [facility], you and your health needs are our priority.  As part of our continuing mission to provide you with exceptional heart care, we have created designated Provider Care Teams.  These Care Teams include your primary Cardiologist (physician) and Advanced Practice Providers (APPs -  Physician Assistants and Nurse Practitioners) who all work together to provide you with the care you need, when you need it.  We recommend signing up for the patient portal called "MyChart".  Sign up information is provided on this After Visit Summary.  MyChart is used to connect with patients for Virtual Visits (Telemedicine).  Patients are able to view lab/test results, encounter notes, upcoming appointments, etc.  Non-urgent messages can be sent to your provider as well.   To learn more about what you can do with MyChart, go to [email or web address].    Your next appointment:   1 year(s)  Provider:   Cristopher Peru, MD    Other Instructions Thank you for choosing [facility]!

## 2022-06-28 DIAGNOSIS — M109 Gout, unspecified: Secondary | ICD-10-CM | POA: Diagnosis not present

## 2022-06-28 DIAGNOSIS — K649 Unspecified hemorrhoids: Secondary | ICD-10-CM | POA: Diagnosis not present

## 2022-06-28 DIAGNOSIS — I5033 Acute on chronic diastolic (congestive) heart failure: Secondary | ICD-10-CM | POA: Diagnosis not present

## 2022-06-28 DIAGNOSIS — Z6830 Body mass index (BMI) 30.0-30.9, adult: Secondary | ICD-10-CM | POA: Diagnosis not present

## 2022-06-28 DIAGNOSIS — J449 Chronic obstructive pulmonary disease, unspecified: Secondary | ICD-10-CM | POA: Diagnosis not present

## 2022-06-28 DIAGNOSIS — J309 Allergic rhinitis, unspecified: Secondary | ICD-10-CM | POA: Diagnosis not present

## 2022-06-28 DIAGNOSIS — N189 Chronic kidney disease, unspecified: Secondary | ICD-10-CM | POA: Diagnosis not present

## 2022-06-28 DIAGNOSIS — F419 Anxiety disorder, unspecified: Secondary | ICD-10-CM | POA: Diagnosis not present

## 2022-06-28 DIAGNOSIS — I739 Peripheral vascular disease, unspecified: Secondary | ICD-10-CM | POA: Diagnosis not present

## 2022-06-28 DIAGNOSIS — F329 Major depressive disorder, single episode, unspecified: Secondary | ICD-10-CM | POA: Diagnosis not present

## 2022-06-28 DIAGNOSIS — M545 Low back pain, unspecified: Secondary | ICD-10-CM | POA: Diagnosis not present

## 2022-06-28 DIAGNOSIS — I10 Essential (primary) hypertension: Secondary | ICD-10-CM | POA: Diagnosis not present

## 2022-08-03 DIAGNOSIS — R0602 Shortness of breath: Secondary | ICD-10-CM | POA: Diagnosis not present

## 2022-08-03 DIAGNOSIS — I509 Heart failure, unspecified: Secondary | ICD-10-CM | POA: Diagnosis not present

## 2022-08-03 DIAGNOSIS — J449 Chronic obstructive pulmonary disease, unspecified: Secondary | ICD-10-CM | POA: Diagnosis not present

## 2022-08-09 ENCOUNTER — Other Ambulatory Visit: Payer: Self-pay | Admitting: Cardiology

## 2022-08-09 ENCOUNTER — Encounter: Payer: Self-pay | Admitting: Cardiology

## 2022-08-09 ENCOUNTER — Ambulatory Visit: Payer: PPO | Attending: Cardiology | Admitting: Cardiology

## 2022-08-09 ENCOUNTER — Ambulatory Visit (INDEPENDENT_AMBULATORY_CARE_PROVIDER_SITE_OTHER): Payer: PPO

## 2022-08-09 VITALS — BP 118/74 | HR 88 | Ht 62.0 in | Wt 162.0 lb

## 2022-08-09 DIAGNOSIS — I5032 Chronic diastolic (congestive) heart failure: Secondary | ICD-10-CM | POA: Diagnosis not present

## 2022-08-09 DIAGNOSIS — I2510 Atherosclerotic heart disease of native coronary artery without angina pectoris: Secondary | ICD-10-CM | POA: Diagnosis not present

## 2022-08-09 DIAGNOSIS — R002 Palpitations: Secondary | ICD-10-CM

## 2022-08-09 DIAGNOSIS — R0602 Shortness of breath: Secondary | ICD-10-CM | POA: Diagnosis not present

## 2022-08-09 DIAGNOSIS — R0789 Other chest pain: Secondary | ICD-10-CM

## 2022-08-09 NOTE — Progress Notes (Signed)
Clinical Summary Ms. Jupin is a 89 y.o.female seen today for follow up of the following medical problems.    1. Chronic diastolic HF   - 05/2020 echo LVEF 60-65%, no WMAs, indet diastolic fxn,    - some orthostatic dizziness - some ongoing edema at times -taking lasix 40mg  daily      2. PAF -  TEE/DCCV performed 11/10/2019 with 3 shocks resulting in normal sinus rhythm - from notes does better breathing wise in SR   -some palpitations at times, usually with exertion. TEnds to come on with the wheezing, SOB.  - no bleeding on eliquis.   - at EP appt toprol increased to 50mg  bid due to palpitations - ongoing palpitations symptoms, typically with activity.      3. CAD - prior DES to LCX in 2015 - repeat caths 2017 and 2020 without obstructive disease other than ostial disease of a small RCA with recs for medical management - 10/2019 nuclear stress: no ischemia - 06/2020 nuclear stress: no ischemia      -chronic nonspecific chest pains - under left breast pressing like feeling, worst with deep breathing. Lasts a few seconds. Can occur at rest or with exertion.      4. NSVT - on toprol, has been followed by EP Dr Ladona Ridgel       5. Syncope - recent monitor with rare SVT and NSVT, 2% afib burden. No arrhythmias consistent with cause of syncope     6. CKD III   7. Melena/microcytic anemia - followed by GI - recent admit to [facility] 01/2020, Hgb 6.7 - some ongoing bleeding intermittently       8 COPD - only has nebulizers.   [name] Kitchen SOB - no recent LE edema - mainly with activities - some wehezing at times, some cough. Not productive - uses nebulizer twice a day.   - breathing improved with new inhaler.   Recent course of doxy   Reports allergy to prednsione.  Past Medical History:  Diagnosis Date   Anxiety    Arthritis    Atrial fibrillation (HCC)    Bursitis    Left shoulder   Cataract    CHF (congestive heart failure) (HCC)    CKD (chronic  kidney disease)    stage 3-4   COPD (chronic obstructive pulmonary disease) (HCC)    Coronary atherosclerosis of native coronary artery    a. s/p DES to LCx in 04/2013 b. cath in 11/2015 showing patent stent with 20% prox-LAD and 80% ostial RCA stenosis for which medical management was recommended due to small artery size   Depression    Diastolic heart failure (HCC)    EF 55-60%   Dysphagia, unspecified(787.20)    Dyspnea    Dysrhythmia    Essential hypertension    GERD (gastroesophageal reflux disease)    Hx Schatzki's ring, multiple EGD/ED last 01/06/2004   Gout    Headache    History of anemia    Hyperlipidemia    Internal hemorrhoids without mention of complication    MI (myocardial infarction) (HCC) 2006   Microscopic colitis 2003   Panic disorder without agoraphobia    Paresthesia    Pneumonia 12/2011   PVD (peripheral vascular disease) (HCC)    S/P colonoscopy 09/27/2001   internal hemorrhoids, desc colon inflam polyp, SB BX-chronic duodenitis, colitis   Sleep apnea    Thyroid disease      Allergies  Allergen Reactions   Cephalosporins Diarrhea and  Nausea Only    Lightheaded   Levaquin [Levofloxacin In D5w] Swelling   Macrodantin [Nitrofurantoin Macrocrystal] Swelling   Phenothiazines Anaphylaxis and Hives   Polysorbate Anaphylaxis   Prednisone Shortness Of Breath   Buspirone Itching   Cardura [Doxazosin Mesylate] Itching   Codeine Itching   Acyclovir And Related Itching    Redness of skin   Colcrys [Colchicine] Nausea Only    Upset stomach    Prochlorperazine Other (See Comments)    "Upset stomach"   Ranexa [Ranolazine]     Severe drop in BP   Atorvastatin Hives    Cramping; tolerates Crestor ok   Colestipol Palpitations   Ofloxacin Rash   Other Itching and Rash    "WOOL"= make skin look like it has been burned   Penicillins Other (See Comments)    Causes redness all over. Has patient had a PCN reaction causing immediate rash, facial/tongue/throat  swelling, SOB or lightheadedness with hypotension: No Has patient had a PCN reaction causing severe rash involving mucus membranes or skin necrosis: No Has patient had a PCN reaction that required hospitalization No Has patient had a PCN reaction occurring within the last 10 years: No If all of the above answers are "NO", then may proceed with Cephalosporin use.    Pimozide Hives and Itching     Current Outpatient Medications  Medication Sig Dispense Refill   acetaminophen (TYLENOL) 500 MG tablet Take 500 mg by mouth every 6 (six) hours as needed for headache or moderate pain.     ALPRAZolam (XANAX) 0.5 MG tablet Take 0.5 mg by mouth daily as needed for anxiety or sleep.     apixaban (ELIQUIS) 5 MG TABS tablet Take 1 tablet (5 mg total) by mouth 2 (two) times daily. 180 tablet 3   ascorbic acid (VITAMIN C) 500 MG tablet Take 1 tablet (500 mg total) by mouth daily. 30 tablet 2   dexamethasone (DECADRON) 4 MG tablet Take 8 mg by mouth daily.     dexamethasone (DECADRON) 4 MG tablet Take 2 tablets (8 mg total) by mouth daily. 10 tablet 0   diltiazem (CARDIZEM CD) 120 MG 24 hr capsule TAKE ONE CAPSULE BY MOUTH DAILY 90 capsule 3   DULoxetine (CYMBALTA) 60 MG capsule Take 60 mg by mouth at bedtime.     fluticasone (CUTIVATE) 0.05 % cream Apply 1 application topically 2 (two) times daily as needed (skin tears).     furosemide (LASIX) 40 MG tablet TAKE 1 AND 1/2 TABLETS BY MOUTH DAILY 135 tablet 3   hydrocortisone (ANUSOL-HC) 2.5 % rectal cream Place 1 application rectally 2 (two) times daily. 30 g 1   isosorbide mononitrate (IMDUR) 60 MG 24 hr tablet Take 60 mg by mouth daily.     magnesium oxide (MAG-OX) 400 (240 Mg) MG tablet Take 1 tablet by mouth daily.     metoprolol succinate (TOPROL-XL) 50 MG 24 hr tablet Take 1 tablet (50 mg total) by mouth 2 (two) times daily. Take with or immediately following a meal. 180 tablet 3   Multiple Vitamins-Minerals (HAIR/SKIN/NAILS) CAPS Take 1 tablet by  mouth daily.     Naphazoline HCl (CLEAR EYES OP) Place 1 drop into both eyes daily as needed (itching).     nitroGLYCERIN (NITROSTAT) 0.4 MG SL tablet DISSOLVE ONE TABLET UNDER TONGUE EVERY 5 MINUTES UP TO 3 DOSES AS NEEDED FOR CHEST PAIN. 25 tablet 6   pantoprazole (PROTONIX) 40 MG tablet TAKE ONE TABLET BY MOUTH TWICE DAILY BEFORE MEALS 60  tablet 5   potassium chloride (MICRO-K) 10 MEQ CR capsule Take 10 mEq by mouth daily.     traMADol (ULTRAM) 50 MG tablet Take 1 tablet (50 mg total) by mouth every 6 (six) hours as needed. 90 tablet 3   triamcinolone cream (KENALOG) 0.1 % Apply topically 2 (two) times daily.     zinc sulfate 220 (50 Zn) MG capsule Take 1 capsule (220 mg total) by mouth daily. 30 capsule 3   No current facility-administered medications for this visit.     Past Surgical History:  Procedure Laterality Date   ABDOMINAL HYSTERECTOMY     ABDOMINAL HYSTERECTOMY     AGILE CAPSULE N/A 08/30/2020   Procedure: AGILE CAPSULE;  Surgeon: Corbin Ade, MD;  Location: [facility];  Service: Endoscopy;  Laterality: N/A;  7:30am   ANTERIOR AND POSTERIOR REPAIR     with resection of vagina   ANTERIOR LAT LUMBAR FUSION N/A 08/01/2016   Procedure: Lumbar Two-Lumbar Five Transpsoas lateral interbody fusion with Lumbar Two-Three lateral plate fixation;  Surgeon: Loura Halt Ditty, MD;  Location: [facility] OR;  Service: Neurosurgery;  Laterality: N/A;  L2-5 Transpsoas lateral interbody fusion with L2-3 lateral plate fixation   APPENDECTOMY     BACK SURGERY     BIOPSY  07/05/2015   Procedure: BIOPSY;  Surgeon: Corbin Ade, MD;  Location: [facility];  Service: Endoscopy;;  gastric polyp biopsy, ascending colon biopsy   BIOPSY  02/16/2020   Procedure: BIOPSY;  Surgeon: Corbin Ade, MD;  Location: [facility];  Service: Endoscopy;;   BIOPSY  06/28/2020   Procedure: BIOPSY;  Surgeon: Corbin Ade, MD;  Location: [facility];  Service: Endoscopy;;   BLADDER SUSPENSION  11/09/2011    Procedure: TRANSVAGINAL TAPE (TVT) PROCEDURE;  Surgeon: Ky Barban, MD;  Location: [facility];  Service: Urology;  Laterality: N/A;   bladder tack  06/2010   BREAST LUMPECTOMY  1998   left, benign   CARDIAC CATHETERIZATION     CARDIAC CATHETERIZATION     CARDIAC CATHETERIZATION N/A 12/16/2015   Procedure: Left Heart Cath and Coronary Angiography;  Surgeon: Lennette Bihari, MD;  Location: [facility] IN[facility]AS[facility] CV LAB;  Service: Cardiovascular;  Laterality: N/A;   CARDIOVERSION N/A 10/04/2017   Procedure: CARDIOVERSION;  Surgeon: Laqueta Linden, MD;  Location: [facility];  Service: Cardiovascular;  Laterality: N/A;   CARDIOVERSION N/A 01/30/2018   Procedure: CARDIOVERSION;  Surgeon: Laqueta Linden, MD;  [facility]: [facility];  Service: Cardiovascular;  Laterality: N/A;   CARDIOVERSION N/A 11/10/2019   Procedure: CARDIOVERSION;  Surgeon: Wendall Stade, MD;  Location: [facility] ENDOSCOPY;  Service: Cardiovascular;  Laterality: N/A;   CARPAL TUNNEL RELEASE  1989   left   cataract surgery     CHOLECYSTECTOMY  1998   Cholecystectomy     COLONOSCOPY  03/16/2011   multiple hyperplastic colon polyps, sigmoid diverticulosis, melanosis coli   COLONOSCOPY WITH PROPOFOL N/A 07/05/2015   RMR:one 5 mm polyp in descending colon   COLONOSCOPY WITH PROPOFOL N/A 06/28/2020   Procedure: COLONOSCOPY WITH PROPOFOL;  Surgeon: Corbin Ade, MD;  [facility]: [facility];  Service: Endoscopy;  Laterality: N/A;  am appt   CORONARY ANGIOGRAPHY N/A 05/16/2018   Procedure: CORONARY ANGIOGRAPHY (CATH LAB);  Surgeon: Lyn Records, MD;  Location: [facility] INVASIVE CV LAB;  Service: Cardiovascular;  Laterality: N/A;   CORONARY ANGIOPLASTY WITH STENT PLACEMENT  2015   ESOPHAGEAL DILATION N/A 07/05/2015   Procedure: ESOPHAGEAL  DILATION;  Surgeon: Corbin Ade, MD;  Location: [facility];  Service: Endoscopy;  Laterality: N/A;   ESOPHAGOGASTRODUODENOSCOPY (EGD) WITH PROPOFOL N/A 07/05/2015   [id number]   ESOPHAGOGASTRODUODENOSCOPY  (EGD) WITH PROPOFOL N/A 02/16/2020   Procedure: ESOPHAGOGASTRODUODENOSCOPY (EGD) WITH PROPOFOL;  Surgeon: Corbin Ade, MD;  Normal examined esophagus s/p dilation, erythematous mucosa in the stomach s/p biopsy, normal examined duodenum. Pathology with mild gastropathy, negative for H. Pylori.    GIVENS CAPSULE STUDY N/A 11/10/2020   Procedure: GIVENS CAPSULE STUDY;  Surgeon: Corbin Ade, MD;  [facility]: [facility];  Service: Endoscopy;  Laterality: N/A;  7:30am   JOINT REPLACEMENT Right 2007   right knee   left hand surgery     LEFT HEART CATHETERIZATION WITH CORONARY ANGIOGRAM N/A 05/14/2013   Procedure: LEFT HEART CATHETERIZATION WITH CORONARY ANGIOGRAM;  Surgeon: Micheline Chapman, MD;  Location: [facility] CATH LAB;  Service: Cardiovascular;  Laterality: N/A;   left rotator cuff surgery     LUMBAR LAMINECTOMY/DECOMPRESSION MICRODISCECTOMY N/A 10/11/2012   Procedure: LUMBAR LAMINECTOMY/DECOMPRESSION MICRODISCECTOMY 2 LEVELS;  Surgeon: Karn Cassis, MD;  Location: [facility];  Service: Neurosurgery;  Laterality: N/A;  L3-4 L4-5 Laminectomy   LUMBAR WOUND DEBRIDEMENT N/A 09/27/2015   Procedure: Exploration of Lumbar Wound w/ Repair CSF Leak/Lumbar Drain Placement;  Surgeon: Hilda Lias, MD;  Location: [facility]O [facility];  Service: Neurosurgery;  Laterality: N/A;   MALONEY DILATION  03/16/2011   Gastritis. No H.pylori on bx. 97F maloney dilation with disruption of  occult cevical esophageal web   MALONEY DILATION N/A 02/16/2020   Procedure: MALONEY DILATION;  Surgeon: Corbin Ade, MD;  [facility];  Service: Endoscopy;  Laterality: N/A;   NASAL SINUS SURGERY     right knee replacement  2007   right leg benign tumor     SHOULDER SURGERY Left    TEE WITHOUT CARDIOVERSION N/A 10/04/2017   Procedure: TRANSESOPHAGEAL ECHOCARDIOGRAM (TEE) WITH PROPOFOL;  Surgeon: Laqueta Linden, MD;  Location: [facility];  Service: Cardiovascular;  Laterality: N/A;   TEE WITHOUT CARDIOVERSION N/A  11/10/2019   Procedure: TRANSESOPHAGEAL ECHOCARDIOGRAM (TEE);  Surgeon: Wendall Stade, MD;  Location: [facility] ENDOSCOPY;  Service: Cardiovascular;  Laterality: N/A;   TONSILLECTOMY     unspecified area, hysterectomy  1972   partial     Allergies  Allergen Reactions   Cephalosporins Diarrhea and Nausea Only    Lightheaded   Levaquin [Levofloxacin In D5w] Swelling   Macrodantin [Nitrofurantoin Macrocrystal] Swelling   Phenothiazines Anaphylaxis and Hives   Polysorbate Anaphylaxis   Prednisone Shortness Of Breath   Buspirone Itching   Cardura [Doxazosin Mesylate] Itching   Codeine Itching   Acyclovir And Related Itching    Redness of skin   Colcrys [Colchicine] Nausea Only    Upset stomach    Prochlorperazine Other (See Comments)    "Upset stomach"   Ranexa [Ranolazine]     Severe drop in BP   Atorvastatin Hives    Cramping; tolerates Crestor ok   Colestipol Palpitations   Ofloxacin Rash   Other Itching and Rash    "WOOL"= make skin look like it has been burned   Penicillins Other (See Comments)    Causes redness all over. Has patient had a PCN reaction causing immediate rash, facial/tongue/throat swelling, SOB or lightheadedness with hypotension: No Has patient had a PCN reaction causing severe rash involving mucus membranes or skin necrosis: No Has patient had a PCN reaction that required hospitalization No Has  patient had a PCN reaction occurring within the last 10 years: No If all of the above answers are "NO", then may proceed with Cephalosporin use.    Pimozide Hives and Itching      Family History  Problem Relation Age of Onset   Stroke Mother    Parkinson's disease Father    Coronary artery disease Other        family Hx-sons   Cancer Other    Stroke Other        family Hx   Hypertension Other        family Hx   Diabetes Brother    Heart disease Son        before age 7   Diabetes Son    Stroke Daughter 55   Colon cancer Grandson        diagnosed 40    Inflammatory bowel disease Neg Hx      Social History Ms. Phillis reports that she quit smoking about 20 years ago. Her smoking use included cigarettes. She started smoking about 74 years ago. She has a 64.00 pack-year smoking history. She has never used smokeless tobacco. Ms. Mikel reports that she does not currently use alcohol.   Review of Systems CONSTITUTIONAL: No weight loss, fever, chills, weakness or fatigue.  HEENT: Eyes: No visual loss, blurred vision, double vision or yellow sclerae.No hearing loss, sneezing, congestion, runny nose or sore throat.  SKIN: No rash or itching.  CARDIOVASCULAR: per hpi RESPIRATORY: per hpi GASTROINTESTINAL: No anorexia, nausea, vomiting or diarrhea. No abdominal pain or blood.  GENITOURINARY: No burning on urination, no polyuria NEUROLOGICAL: No headache, dizziness, syncope, paralysis, ataxia, numbness or tingling in the extremities. No change in bowel or bladder control.  MUSCULOSKELETAL: No muscle, back pain, joint pain or stiffness.  LYMPHATICS: No enlarged nodes. No history of splenectomy.  PSYCHIATRIC: No history of depression or anxiety.  ENDOCRINOLOGIC: No reports of sweating, cold or heat intolerance. No polyuria or polydipsia.  [name] Kitchen   Physical Examination Today's Vitals   08/09/22 1357  BP: 118/74  Pulse: 88  SpO2: 96%  Weight: 162 lb (73.5 kg)  Height: 5\' 2"  (1.575 m)   Body mass index is 29.63 kg/m.  Gen: resting comfortably, no acute distress HEENT: no scleral icterus, pupils equal round and reactive, no palptable cervical adenopathy,  CV: RRR, no mrg, no jvd Resp: Clear to auscultation bilaterally GI: abdomen is soft, non-tender, non-distended, normal bowel sounds, no hepatosplenomegaly MSK: extremities are warm, no edema.  Skin: warm, no rash Neuro:  no focal deficits Psych: appropriate affect   Diagnostic Studies  Cardiac Catheterization: 05/16/2018 Patent short left main 20 to 30% proximal LAD irregularity, with  diffuse 20% narrowing within the mid vessel. Patent proximal circumflex stent with mid to distal circumflex irregularities up to 30 to 40%. Nondominant right coronary with ostial 90% narrowing. No hemodynamics recorded.  Left ventriculography was not performed in an effort to decrease contrast exposure in the setting of CKD.   RECOMMENDATIONS:   Compared to prior angiography in 2017, no significant changes noted. Chest pain is atypical and may be more musculoskeletal or pleuritic. Resume Eliquis in 12 hours/a.m. of 05/17/2018   Echocardiogram 10/02/18:   1. The left ventricle has normal systolic function with an ejection  fraction of 60-65%. The cavity size was normal. There is mildly increased  left ventricular wall thickness. Left ventricular diastolic Doppler  parameters are consistent with impaired  relaxation. Elevated mean left atrial pressure.   2. The  right ventricle has normal systolic function. The cavity was  normal. There is no increase in right ventricular wall thickness.   3. Left atrial size was mildly dilated.   4. The aortic valve is tricuspid. Mild thickening of the aortic valve.  Mild calcification of the aortic valve. Aortic valve regurgitation is mild  by color flow Doppler. No stenosis of the aortic valve. Mild aortic  annular calcification noted.   5. The mitral valve is abnormal. Mild thickening of the mitral valve  leaflet. Mild calcification of the mitral valve leaflet. There is mild  mitral annular calcification present. No evidence of mitral valve  stenosis.   6. The aortic root is normal in size and structure.   7. Pulmonary hypertension is indeterminant, inadequate TR jet.      Event monitor 05/13/19:   1. NSR 2. NSVT, lasting upto 6 beats 3. No prolonged pauses or bradycardia 4. No atrial fib     09/2019 echo 1. Left ventricular ejection fraction, by estimation, is 60 to 65%. The  left ventricle has normal function. The left ventricle has no  regional  wall motion abnormalities. There is mild left ventricular hypertrophy.  Left ventricular diastolic parameters  are indeterminate.   2. Right ventricular systolic function is normal. The right ventricular  size is normal. There is normal pulmonary artery systolic pressure.   3. The mitral valve is normal in structure. Trivial mitral valve  regurgitation. No evidence of mitral stenosis.   4. The aortic valve was not well visualized. Aortic valve regurgitation  is mild. No aortic stenosis is present.   05/2020 echo IMPRESSIONS     1. Left ventricular ejection fraction, by estimation, is 60 to 65%. The  left ventricle has normal function. The left ventricle has no regional  wall motion abnormalities. Left ventricular diastolic parameters are  indeterminate.   2. Right ventricular systolic function is normal. The right ventricular  size is normal.   3. The mitral valve is normal in structure. No evidence of mitral valve  regurgitation. No evidence of mitral stenosis.   4. The aortic valve has an indeterminant number of cusps. There is mild  calcification of the aortic valve. There is mild thickening of the aortic  valve. Aortic valve regurgitation is trivial. No aortic stenosis is  present.   5. The inferior vena cava is normal in size with greater than 50%  respiratory variability, suggesting right atrial pressure of 3 mmHg.   05/2020 ABI Summary:  Right: Resting right ankle-brachial index indicates moderate right lower  extremity arterial disease. The right toe-brachial index is abnormal. RT  great toe pressure = 74 mmHg.   Left: Resting left ankle-brachial index indicates moderate left lower  extremity arterial disease. The left toe-brachial index is abnormal. LT  Great toe pressure = 73 mmHg   06/2020 nuclear stress There was no ST segment deviation noted during stress. The study is normal. There are no perfusion defects consistent with prior infarct or current  ischemia. This is a low risk study. The left ventricular ejection fraction is hyperdynamic (>65%).  06/2020 monitor 14 day monitor Rare supreventricular ectopy. Rare SVT episodes up to 19 beats Rate ventricular ectopy. Isolated episode of NSVT at 7 beats. 2% afib burden. Primarily sinus rhythm during study Multiple symptoms reported in diary but the symptoms were not reported with date and time to correlate with heart rhythm  Assessment and Plan   1.Chronic diastolic HF - appears euvoelmic, continue current diuretic  - with  progression DOE repeat echo   2. Afib/acquired thrombophlia - ongoing palpitatons, we will recheck a monitor to reassess     3.CAD/Chest pain/SOB - several year history.  - repeat caths 2017 and 2020 without obstructive disease other than ostial disease of a small RCA with recs for medical management. Stress tests 2021 and 2022 were benign - chronic chest pain, no plans for repeat ischemic testing at this time - f/u echo    Antoine Poche, M.D.

## 2022-08-09 NOTE — Patient Instructions (Addendum)
Medication Instructions:  Your physician recommends that you continue on your current medications as directed. Please refer to the Current Medication list given to you today.  Labwork: none  Testing/Procedures: Your physician has requested that you have an echocardiogram. Echocardiography is a painless test that uses sound waves to create images of your heart. It provides your doctor with information about the size and shape of your heart and how well your heart's chambers and valves are working. This procedure takes approximately one hour. There are no restrictions for this procedure. Please do NOT wear cologne, perfume, aftershave, or lotions (deodorant is allowed). Please arrive 15 minutes prior to your appointment time. Your physician has recommended that you wear a Zio monitor.   This monitor is a medical device that records the heart's electrical activity. Doctors most often use these monitors to diagnose arrhythmias. Arrhythmias are problems with the speed or rhythm of the heartbeat. The monitor is a small device applied to your chest. You can wear one while you do your normal daily activities. While wearing this monitor if you have any symptoms to push the button and record what you felt. Once you have worn this monitor for the period of time provider prescribed (for 3 days), you will return the monitor device in the postage paid box. Once it is returned they will download the data collected and provide [place] with a report which the provider will then review and we will call you with those results. Important tips:  Avoid showering during the first 24 hours of wearing the monitor. Avoid excessive sweating to help maximize wear time. Do not submerge the device, no hot tubs, and no swimming pools. Keep any lotions or oils away from the patch. After 24 hours you may shower with the patch on. Take brief showers with your back facing the shower head.  Do not remove patch once it has been placed  because that will interrupt data and decrease adhesive wear time. Push the button when you have any symptoms and write down what you were feeling. Once you have completed wearing your monitor, remove and place into box which has postage paid and place in your outgoing mailbox.  If for some reason you have misplaced your box then call our office and we can provide another box and/or mail it off for you.  Follow-Up: Your physician recommends that you schedule a follow-up appointment in: 6 weeks with Sharlene Dory NP  Any Other Special Instructions Will Be Listed Below (If Applicable).  If you need a refill on your cardiac medications before your next appointment, please call your pharmacy.

## 2022-08-10 ENCOUNTER — Ambulatory Visit (HOSPITAL_COMMUNITY)
Admission: RE | Admit: 2022-08-10 | Discharge: 2022-08-10 | Disposition: A | Discharge status: Home / Self Care | Payer: PPO | Source: Ambulatory Visit | Attending: Cardiology | Admitting: Cardiology

## 2022-08-10 DIAGNOSIS — R0602 Shortness of breath: Secondary | ICD-10-CM | POA: Diagnosis not present

## 2022-08-10 LAB — ECHOCARDIOGRAM COMPLETE
Area-P 1/2: 4.15 cm2
P 1/2 time: 527 msec
S' Lateral: 2.1 cm

## 2022-08-10 NOTE — Progress Notes (Signed)
*  PRELIMINARY RESULTS* Echocardiogram 2D Echocardiogram has been performed.  [name] 08/10/2022, 10:14 AM

## 2022-08-18 DIAGNOSIS — R002 Palpitations: Secondary | ICD-10-CM | POA: Diagnosis not present

## 2022-08-28 DIAGNOSIS — M109 Gout, unspecified: Secondary | ICD-10-CM | POA: Diagnosis not present

## 2022-08-28 DIAGNOSIS — Z6830 Body mass index (BMI) 30.0-30.9, adult: Secondary | ICD-10-CM | POA: Diagnosis not present

## 2022-08-28 DIAGNOSIS — F419 Anxiety disorder, unspecified: Secondary | ICD-10-CM | POA: Diagnosis not present

## 2022-08-28 DIAGNOSIS — F329 Major depressive disorder, single episode, unspecified: Secondary | ICD-10-CM | POA: Diagnosis not present

## 2022-08-28 DIAGNOSIS — M5441 Lumbago with sciatica, right side: Secondary | ICD-10-CM | POA: Diagnosis not present

## 2022-08-28 DIAGNOSIS — I739 Peripheral vascular disease, unspecified: Secondary | ICD-10-CM | POA: Diagnosis not present

## 2022-09-05 DIAGNOSIS — N183 Chronic kidney disease, stage 3 unspecified: Secondary | ICD-10-CM | POA: Diagnosis not present

## 2022-09-05 DIAGNOSIS — F419 Anxiety disorder, unspecified: Secondary | ICD-10-CM | POA: Diagnosis not present

## 2022-09-05 DIAGNOSIS — I10 Essential (primary) hypertension: Secondary | ICD-10-CM | POA: Diagnosis not present

## 2022-09-05 DIAGNOSIS — F329 Major depressive disorder, single episode, unspecified: Secondary | ICD-10-CM | POA: Diagnosis not present

## 2022-09-05 DIAGNOSIS — I739 Peripheral vascular disease, unspecified: Secondary | ICD-10-CM | POA: Diagnosis not present

## 2022-09-05 DIAGNOSIS — J309 Allergic rhinitis, unspecified: Secondary | ICD-10-CM | POA: Diagnosis not present

## 2022-09-05 DIAGNOSIS — J449 Chronic obstructive pulmonary disease, unspecified: Secondary | ICD-10-CM | POA: Diagnosis not present

## 2022-09-05 DIAGNOSIS — K649 Unspecified hemorrhoids: Secondary | ICD-10-CM | POA: Diagnosis not present

## 2022-09-05 DIAGNOSIS — I5033 Acute on chronic diastolic (congestive) heart failure: Secondary | ICD-10-CM | POA: Diagnosis not present

## 2022-09-05 DIAGNOSIS — M5441 Lumbago with sciatica, right side: Secondary | ICD-10-CM | POA: Diagnosis not present

## 2022-09-05 DIAGNOSIS — M109 Gout, unspecified: Secondary | ICD-10-CM | POA: Diagnosis not present

## 2022-09-15 DIAGNOSIS — I10 Essential (primary) hypertension: Secondary | ICD-10-CM | POA: Diagnosis not present

## 2022-09-15 DIAGNOSIS — J449 Chronic obstructive pulmonary disease, unspecified: Secondary | ICD-10-CM | POA: Diagnosis not present

## 2022-09-20 ENCOUNTER — Ambulatory Visit: Payer: PPO | Attending: Nurse Practitioner | Admitting: Nurse Practitioner

## 2022-09-20 VITALS — BP 138/60 | HR 88 | Ht 61.0 in | Wt 163.6 lb

## 2022-09-20 DIAGNOSIS — R0602 Shortness of breath: Secondary | ICD-10-CM

## 2022-09-20 DIAGNOSIS — I2510 Atherosclerotic heart disease of native coronary artery without angina pectoris: Secondary | ICD-10-CM

## 2022-09-20 DIAGNOSIS — Z789 Other specified health status: Secondary | ICD-10-CM | POA: Diagnosis not present

## 2022-09-20 DIAGNOSIS — R079 Chest pain, unspecified: Secondary | ICD-10-CM | POA: Diagnosis not present

## 2022-09-20 DIAGNOSIS — J449 Chronic obstructive pulmonary disease, unspecified: Secondary | ICD-10-CM

## 2022-09-20 DIAGNOSIS — D500 Iron deficiency anemia secondary to blood loss (chronic): Secondary | ICD-10-CM | POA: Diagnosis not present

## 2022-09-20 DIAGNOSIS — N183 Chronic kidney disease, stage 3 unspecified: Secondary | ICD-10-CM

## 2022-09-20 DIAGNOSIS — I5032 Chronic diastolic (congestive) heart failure: Secondary | ICD-10-CM | POA: Diagnosis not present

## 2022-09-20 DIAGNOSIS — Z79899 Other long term (current) drug therapy: Secondary | ICD-10-CM

## 2022-09-20 DIAGNOSIS — R0609 Other forms of dyspnea: Secondary | ICD-10-CM

## 2022-09-20 DIAGNOSIS — E785 Hyperlipidemia, unspecified: Secondary | ICD-10-CM

## 2022-09-20 DIAGNOSIS — I4891 Unspecified atrial fibrillation: Secondary | ICD-10-CM

## 2022-09-20 MED ORDER — EZETIMIBE 10 MG PO TABS: 10.0000 mg | ORAL_TABLET | Freq: Every day | ORAL | 3 refills | Status: DC

## 2022-09-20 MED ORDER — ISOSORBIDE MONONITRATE ER 60 MG PO TB24: 90.0000 mg | ORAL_TABLET | Freq: Every day | ORAL | 6 refills | Status: DC

## 2022-09-20 NOTE — Progress Notes (Signed)
Office Visit    Patient Name: [name] Date of Encounter: 09/20/2022  PCP:  Sheela Stack   [facility]  Cardiologist:  Dina Rich, MD  Advanced Practice Provider:  No care team member to display Electrophysiologist:  Lewayne Bunting, MD   Chief Complaint    [name] is a 89 y.o. female with a hx of chronic diastolic CHF, PAF, CAD, history of NSVT, IDA, history of syncope, CKD stage III, COPD, shortness of breath, who presents today for shortness of breath evaluation.  Past Medical History    Past Medical History:  Diagnosis Date   Anxiety    Arthritis    Atrial fibrillation (HCC)    Bursitis    Left shoulder   Cataract    CHF (congestive heart failure) (HCC)    CKD (chronic kidney disease)    stage 3-4   COPD (chronic obstructive pulmonary disease) (HCC)    Coronary atherosclerosis of native coronary artery    a. s/p DES to LCx in 04/2013 b. cath in 11/2015 showing patent stent with 20% prox-LAD and 80% ostial RCA stenosis for which medical management was recommended due to small artery size   Depression    Diastolic heart failure (HCC)    EF 55-60%   Dysphagia, unspecified(787.20)    Dyspnea    Dysrhythmia    Essential hypertension    GERD (gastroesophageal reflux disease)    Hx Schatzki's ring, multiple EGD/ED last 01/06/2004   Gout    Headache    History of anemia    Hyperlipidemia    Internal hemorrhoids without mention of complication    MI (myocardial infarction) (HCC) 2006   Microscopic colitis 2003   Panic disorder without agoraphobia    Paresthesia    Pneumonia 12/2011   PVD (peripheral vascular disease) (HCC)    S/P colonoscopy 09/27/2001   internal hemorrhoids, desc colon inflam polyp, SB BX-chronic duodenitis, colitis   Sleep apnea    Thyroid disease    Past Surgical History:  Procedure Laterality Date   ABDOMINAL HYSTERECTOMY     ABDOMINAL HYSTERECTOMY     AGILE CAPSULE N/A 08/30/2020    Procedure: AGILE CAPSULE;  Surgeon: Corbin Ade, MD;  [facility];  Service: Endoscopy;  Laterality: N/A;  7:30am   ANTERIOR AND POSTERIOR REPAIR     with resection of vagina   ANTERIOR LAT LUMBAR FUSION N/A 08/01/2016   Procedure: Lumbar Two-Lumbar Five Transpsoas lateral interbody fusion with Lumbar Two-Three lateral plate fixation;  Surgeon: Loura Halt Ditty, MD;  Location: [facility] OR;  Service: Neurosurgery;  Laterality: N/A;  L2-5 Transpsoas lateral interbody fusion with L2-3 lateral plate fixation   APPENDECTOMY     BACK SURGERY     BIOPSY  07/05/2015   Procedure: BIOPSY;  Surgeon: Corbin Ade, MD;  [facility];  Service: Endoscopy;;  gastric polyp biopsy, ascending colon biopsy   BIOPSY  02/16/2020   Procedure: BIOPSY;  Surgeon: Corbin Ade, MD;  [facility];  Service: Endoscopy;;   BIOPSY  06/28/2020   Procedure: BIOPSY;  Surgeon: Corbin Ade, MD;  [facility];  Service: Endoscopy;;   BLADDER SUSPENSION  11/09/2011   Procedure: TRANSVAGINAL TAPE (TVT) PROCEDURE;  Surgeon: Ky Barban, MD;  Location: [facility];  Service: Urology;  Laterality: N/A;   bladder tack  06/2010   BREAST LUMPECTOMY  1998   left, benign   CARDIAC CATHETERIZATION  CARDIAC CATHETERIZATION     CARDIAC CATHETERIZATION N/A 12/16/2015   Procedure: Left Heart Cath and Coronary Angiography;  Surgeon: Lennette Bihari, MD;  Location: [facility] INVASIVE CV LAB;  Service: Cardiovascular;  Laterality: N/A;   CARDIOVERSION N/A 10/04/2017   Procedure: CARDIOVERSION;  Surgeon: Laqueta Linden, MD;  Location: [facility];  Service: Cardiovascular;  Laterality: N/A;   CARDIOVERSION N/A 01/30/2018   Procedure: CARDIOVERSION;  Surgeon: Laqueta Linden, MD;  [facility];  Service: Cardiovascular;  Laterality: N/A;   CARDIOVERSION N/A 11/10/2019   Procedure: CARDIOVERSION;  Surgeon: Wendall Stade, MD;  Location: [facility] ENDOSCOPY;  Service: Cardiovascular;   Laterality: N/A;   CARPAL TUNNEL RELEASE  1989   left   cataract surgery     CHOLECYSTECTOMY  1998   Cholecystectomy     COLONOSCOPY  03/16/2011   multiple hyperplastic colon polyps, sigmoid diverticulosis, melanosis coli   COLONOSCOPY WITH PROPOFOL N/A 07/05/2015   RMR:one 5 mm polyp in descending colon   COLONOSCOPY WITH PROPOFOL N/A 06/28/2020   Procedure: COLONOSCOPY WITH PROPOFOL;  Surgeon: Corbin Ade, MD;  [facility];  Service: Endoscopy;  Laterality: N/A;  am appt   CORONARY ANGIOGRAPHY N/A 05/16/2018   Procedure: CORONARY ANGIOGRAPHY (CATH LAB);  Surgeon: Lyn Records, MD;  Location: [facility] INVASIVE CV LAB;  Service: Cardiovascular;  Laterality: N/A;   CORONARY ANGIOPLASTY WITH STENT PLACEMENT  2015   ESOPHAGEAL DILATION N/A 07/05/2015   Procedure: ESOPHAGEAL DILATION;  Surgeon: Corbin Ade, MD;  [facility];  Service: Endoscopy;  Laterality: N/A;   ESOPHAGOGASTRODUODENOSCOPY (EGD) WITH PROPOFOL N/A 07/05/2015   ZOX:WRUEAV   ESOPHAGOGASTRODUODENOSCOPY (EGD) WITH PROPOFOL N/A 02/16/2020   Procedure: ESOPHAGOGASTRODUODENOSCOPY (EGD) WITH PROPOFOL;  Surgeon: Corbin Ade, MD;  Normal examined esophagus s/p dilation, erythematous mucosa in the stomach s/p biopsy, normal examined duodenum. Pathology with mild gastropathy, negative for H. Pylori.    GIVENS CAPSULE STUDY N/A 11/10/2020   Procedure: GIVENS CAPSULE STUDY;  Surgeon: Corbin Ade, MD;  [facility];  Service: Endoscopy;  Laterality: N/A;  7:30am   JOINT REPLACEMENT Right 2007   right knee   left hand surgery     LEFT HEART CATHETERIZATION WITH CORONARY ANGIOGRAM N/A 05/14/2013   Procedure: LEFT HEART CATHETERIZATION WITH CORONARY ANGIOGRAM;  Surgeon: Micheline Chapman, MD;  Location: [facility] CATH LAB;  Service: Cardiovascular;  Laterality: N/A;   left rotator cuff surgery     LUMBAR LAMINECTOMY/DECOMPRESSION MICRODISCECTOMY N/A 10/11/2012   Procedure: LUMBAR LAMINECTOMY/DECOMPRESSION  MICRODISCECTOMY 2 LEVELS;  Surgeon: Karn Cassis, MD;  Location: [facility] NEURO ORS;  Service: Neurosurgery;  Laterality: N/A;  L3-4 L4-5 Laminectomy   LUMBAR WOUND DEBRIDEMENT N/A 09/27/2015   Procedure: Exploration of Lumbar Wound w/ Repair CSF Leak/Lumbar Drain Placement;  Surgeon: Hilda Lias, MD;  Location: [facility] NEURO ORS;  Service: Neurosurgery;  Laterality: N/A;   MALONEY DILATION  03/16/2011   Gastritis. No H.pylori on bx. 55F maloney dilation with disruption of  occult cevical esophageal web   MALONEY DILATION N/A 02/16/2020   Procedure: MALONEY DILATION;  Surgeon: Corbin Ade, MD;  [facility];  Service: Endoscopy;  Laterality: N/A;   NASAL SINUS SURGERY     right knee replacement  2007   right leg benign tumor     SHOULDER SURGERY Left    TEE WITHOUT CARDIOVERSION N/A 10/04/2017   Procedure: TRANSESOPHAGEAL ECHOCARDIOGRAM (TEE) WITH PROPOFOL;  Surgeon: Laqueta Linden, MD;  Location: [facility];  Service: Cardiovascular;  Laterality: N/A;   TEE WITHOUT CARDIOVERSION N/A 11/10/2019   Procedure: TRANSESOPHAGEAL ECHOCARDIOGRAM (TEE);  Surgeon: Wendall Stade, MD;  Location: [facility] ENDOSCOPY;  Service: Cardiovascular;  Laterality: N/A;   TONSILLECTOMY     unspecified area, hysterectomy  1972   partial    Allergies  Allergies  Allergen Reactions   Cephalosporins Diarrhea and Nausea Only    Lightheaded   Levaquin [Levofloxacin In D5w] Swelling   Macrodantin [Nitrofurantoin Macrocrystal] Swelling   Phenothiazines Anaphylaxis and Hives   Polysorbate Anaphylaxis   Prednisone Shortness Of Breath   Buspirone Itching   Cardura [Doxazosin Mesylate] Itching   Codeine Itching   Acyclovir And Related Itching    Redness of skin   Colcrys [Colchicine] Nausea Only    Upset stomach    Prochlorperazine Other (See Comments)    "Upset stomach"   Ranexa [Ranolazine]     Severe drop in BP   Atorvastatin Hives    Cramping; tolerates Crestor ok   Colestipol Palpitations    Ofloxacin Rash   Other Itching and Rash    "WOOL"= make skin look like it has been burned   Penicillins Other (See Comments)    Causes redness all over. Has patient had a PCN reaction causing immediate rash, facial/tongue/throat swelling, SOB or lightheadedness with hypotension: No Has patient had a PCN reaction causing severe rash involving mucus membranes or skin necrosis: No Has patient had a PCN reaction that required hospitalization No Has patient had a PCN reaction occurring within the last 10 years: No If all of the above answers are "NO", then may proceed with Cephalosporin use.    Pimozide Hives and Itching    History of Present Illness    [name] is a 89 y.o. female with a PMH as mentioned above.  Previous cardiovascular history of drug-eluting stent to left circumflex in 2015.  Cardiac catheterizations in 2017 and 2020 were negative for obstructive disease, was ostial disease found along small RCA with Rex for medical management.  NSTs in 2021 and 2022 were negative for ischemia.  Underwent TEE with cardioversion in 2021, followed by EP.  Past monitor arranged for past history of syncope and revealed rare SVT with NSVT, 2% A-fib burden.  No arrhythmias to explain her syncope.  Last seen by Dr. Dina Rich on August 09, 2022.  She noted ongoing palpitations and chronic chest pain, monitor was arranged.  Echocardiogram revealed normal heart function.  Monitor revealed 100% A-fib burden, average heart rate 90 bpm, rare ventricular ectopy, reported symptoms correlated with A-fib.  Today she presents for shortness of breath evaluation.  She states she is having progressive shortness of breath with exertion since last OV. Not able to walk as far as she could without getting short of breath. Does admit to intermittent chest pains with this along with her shortness of breath. Location is under her left breast that radiates around side to her back, transient in nature lasting  seconds, denies any NTG use. Denies any palpitations, syncope, presyncope, dizziness, orthopnea, PND, swelling or significant weight changes, acute bleeding, or claudication.  EKGs/Labs/Other Studies Reviewed:   The following studies were reviewed today:   EKG:  EKG is not ordered today.    Cardiac monitor 09/2022:    4 day monitor   100% afib burden rates 57 to 162, avg 90 bpm.   Rare ventricular ectopy in the form of isolated PVCs, couplets   Reported symptoms correlated with afib normal rates, though some  limitations on time accuracy     Patch Wear Time:  4 days and 4 hours (2024-04-24T14:29:15-0400 to 2024-04-28T18:35:23-0400)   Atrial Fibrillation occurred continuously (100% burden), ranging from 57-162 bpm (avg of 90 bpm). Isolated VEs were rare (<1.0%), VE Couplets were rare (<1.0%), and no VE Triplets were present.   Echo 07/2022:  1. Left ventricular ejection fraction, by estimation, is 60 to 65%. The  left ventricle has normal function. The left ventricle has no regional  wall motion abnormalities. Left ventricular diastolic function could not  be evaluated.   2. Right ventricular systolic function is normal. The right ventricular  size is normal. Tricuspid regurgitation signal is inadequate for assessing  PA pressure.   3. Left atrial size was mild to moderately dilated.   4. The mitral valve is abnormal. No evidence of mitral valve  regurgitation. No evidence of mitral stenosis. Moderate mitral annular  calcification.   5. The aortic valve is calcified. There is moderate calcification of the  aortic valve. Aortic valve regurgitation is mild. No aortic stenosis is  present.   6. The inferior vena cava is normal in size with greater than 50%  respiratory variability, suggesting right atrial pressure of 3 mmHg.   Comparison(s): No significant change from prior study.  Myoview 06/2020:  There was no ST segment deviation noted during stress. The study is normal. There  are no perfusion defects consistent with prior infarct or current ischemia. This is a low risk study. The left ventricular ejection fraction is hyperdynamic (>65%).  LHC 2017: Ost RCA lesion, 80 %stenosed. Ost Cx to Prox Cx lesion, 0 %stenosed. Ost LAD to Prox LAD lesion, 20 %stenosed. 1st Diag lesion, 20 %stenosed. 4th Mrg lesion, 20 %stenosed. The left ventricular systolic function is normal. LV end diastolic pressure is mildly elevated. The left ventricular ejection fraction is 60-65% by visual estimate.   Normal LV function with an ejection fraction of 60-65%.  There is evidence for left ventricular hypertrophy.   Coronary obstructive disease with evidence for smooth eccentric plaque in the proximal LAD of 20%, 20% stenosis in the inferior branch of the first diagonal vessel; a widely patent previously placed left circumflex stent extending from the ostium to the first marginal takeoff in a large dominant circumflex vessel with 20% distal narrowing; and a small nondominant RCA with 80% ostial stenosis.   RECOMMENDATION: Medical therapy.  Of note, the patient does have significant change in respiratory intrathoracic pressure from inspiratory and expiratory cycles.  Recent Labs: 06/02/2022: B Natriuretic Peptide 264.0; BUN 36; Creatinine, Ser 1.41; Hemoglobin 13.1; Platelets 356; Potassium 4.2; Sodium 134  Recent Lipid Panel    Component Value Date/Time   CHOL 152 11/10/2019 0502   TRIG 84 09/29/2020 2159   HDL 38 (L) 11/10/2019 0502   CHOLHDL 4.0 11/10/2019 0502   VLDL 16 11/10/2019 0502   LDLCALC 98 11/10/2019 0502    Risk Assessment/Calculations:   CHA2DS2-VASc Score = 6  This indicates a 9.7% annual risk of stroke. The patient's score is based upon: CHF History: 1 HTN History: 1 Diabetes History: 0 Stroke History: 0 Vascular Disease History: 1 Age Score: 2 Gender Score: 1    Home Medications   Current Meds  Medication Sig   acetaminophen (TYLENOL) 500 MG  tablet Take 500 mg by mouth every 6 (six) hours as needed for headache or moderate pain.   ALPRAZolam (XANAX) 0.5 MG tablet Take 0.5 mg by mouth daily as needed for anxiety or sleep.   apixaban (  ELIQUIS) 5 MG TABS tablet Take 1 tablet (5 mg total) by mouth 2 (two) times daily.   ascorbic acid (VITAMIN C) 500 MG tablet Take 1 tablet (500 mg total) by mouth daily.   DULoxetine (CYMBALTA) 30 MG capsule Take 30 mg by mouth daily.   furosemide (LASIX) 40 MG tablet Take 40 mg by mouth daily.   metoprolol succinate (TOPROL-XL) 50 MG 24 hr tablet Take 1 tablet (50 mg total) by mouth 2 (two) times daily. Take with or immediately following a meal.   nitroGLYCERIN (NITROSTAT) 0.4 MG SL tablet DISSOLVE ONE TABLET UNDER TONGUE EVERY 5 MINUTES UP TO 3 DOSES AS NEEDED FOR CHEST PAIN.   triamcinolone cream (KENALOG) 0.1 % Apply topically 2 (two) times daily.   isosorbide mononitrate (IMDUR) 60 MG 24 hr tablet Take 60 mg by mouth daily.     Review of Systems    All other systems reviewed and are otherwise negative except as noted above.  Physical Exam    VS:  BP 138/60   Pulse 88   Ht 5\' 1"  (1.549 m)   Wt 163 lb 9.6 oz (74.2 kg)   SpO2 96%   BMI 30.91 kg/m  , BMI Body mass index is 30.91 kg/m.  Wt Readings from Last 3 Encounters:  09/20/22 163 lb 9.6 oz (74.2 kg)  08/09/22 162 lb (73.5 kg)  06/13/22 152 lb (68.9 kg)    GEN: Well nourished, well developed, in no acute distress. HEENT: normal. Neck: Supple, no JVD, carotid bruits, or masses. Cardiac: S1/S2, RRR, no murmurs, rubs, or gallops. No clubbing, cyanosis, edema.  Radials/PT 2+ and equal bilaterally.  Respiratory:  Respirations regular and unlabored, clear with wheezing noted to left posterior lung fields to auscultation. MS: No deformity or atrophy. Skin: Warm and dry, no rash. Neuro:  Strength and sensation are intact. Psych: Normal affect.  Assessment & Plan    Chronic diastolic CHF, medication management Stage C, NYHA class  II-III symptoms. Echo 07/2022 revealed EF 60-65%. Euvolemic and well compensated on exam. Will increase Lasix to 60 mg daily x 3 days, then return to normal dosing at 40 mg daily. Will also increase potassium to 20 mEq daily x 3 days, then return to 10 mEq daily. Will increase Imdur to 90 mg daily. Continue rest of medication regimen. Low sodium diet, fluid restriction <2L, and daily weights encouraged. Educated to contact our office for weight gain of 2 lbs overnight or 5 lbs in one week. Will obtain labs in 1 week: Mag, BNP, and BMET.   CAD, chest pain of uncertain etiology, DOE Admit to transient episodes of chest pain, appears to be pulmonary related. Not in acute distress on exam. Reviewed NST in 2022 was normal. Will increase Imdur to 90 mg daily. Not on ASA d/t being on Eliquis. Starting Zetia to lower LDL. Not able to tolerate statins. Heart healthy diet encouraged. ED precautions discussed.   3. HLD, statin intolerance Recent LDL 132. Not on any lipid lowering medications. Will start Zetia 10 mg daily. At next visit, will discuss obtaining FLP and LFT per protocol. Heart healthy diet encouraged.   4. PAF Denies any tachycardia or palpitations. HR well controlled. Continue diltiazem, metoprolol, and Eliquis, on appropriate dosage and denies any bleeding issues. Heart healthy diet encouraged.   5. COPD, DOE Admits to progressive DOE, walking shorter distances. Wheezing noted on exam, will obtain 2 view CXR to r/o PNA or anything acute. Will refer to pulmonology. Low suspicion for PE  as she is on Eliquis. Continue current medication regimen. Heart healthy diet encouraged. ED precautions discussed.   6. CKD stage 3 Most recent sCr 1.41 with eGFR 36. Avoid nephrotoxic agents. Obtaining labs in 1 week as mentioned above. Continue to follow with PCP.  7. IDA Daughter requests Hem/Onc referral. Hx of receiving IV infusions. Will place referral.   Disposition: Follow up in 4-6 week(s) with Dina Rich, MD or APP.  Signed, Sharlene Dory, NP 09/23/2022, 1:39 PM [facility]

## 2022-09-20 NOTE — Patient Instructions (Signed)
Medication Instructions:  Your physician has recommended you make the following change in your medication:  Increase isosorbide mononitrate to 90 mg daily Start zetia 10 mg daily Increase furosemide to 60 mg daily for 3 days, then resume 40 mg daily Increase potassium to 20 meq daily for 3 days, then resume 10 meq daily Continue all other medications the same  Labwork: BMET, BNP, & Mag Levels in 1 week (09/27/2022) @[facility] Non-fasting  Testing/Procedures: Chest X-ray in 1 week. (09/27/2022). A chest x-ray takes a picture of the organs and structures inside the chest, including the heart, lungs, and blood vessels. This test can show several things, including, whether the heart is enlarges; whether fluid is building up in the lungs; and whether pacemaker / defibrillator leads are still in place. [facility]  Follow-Up: Your physician recommends that you schedule a follow-up appointment in: 4-6 weeks  Any Other Special Instructions Will Be Listed Below (If Applicable). You have been referred to Pulmonology You have been referred to Hematology  If you need a refill on your cardiac medications before your next appointment, please call your pharmacy.

## 2022-09-21 DIAGNOSIS — I739 Peripheral vascular disease, unspecified: Secondary | ICD-10-CM | POA: Diagnosis not present

## 2022-09-25 ENCOUNTER — Other Ambulatory Visit: Payer: Self-pay | Admitting: *Deleted

## 2022-09-25 DIAGNOSIS — I739 Peripheral vascular disease, unspecified: Secondary | ICD-10-CM

## 2022-09-27 DIAGNOSIS — R0602 Shortness of breath: Secondary | ICD-10-CM | POA: Diagnosis not present

## 2022-09-27 DIAGNOSIS — R531 Weakness: Secondary | ICD-10-CM | POA: Diagnosis not present

## 2022-09-29 ENCOUNTER — Telehealth: Payer: Self-pay | Admitting: *Deleted

## 2022-09-29 NOTE — Telephone Encounter (Signed)
Lesle Chris, LPN 1/61/0960  4:54 PM EDT Back to Top    Notified daughter [name]), copy to pcp.   Lesle Chris, LPN 0/98/1191  4:78 PM EDT     Left message to return call.   Antoine Poche, MD 09/18/2022 10:11 AM EDT     Echo looks good, normal heart function     Dominga Ferry MD

## 2022-10-03 ENCOUNTER — Ambulatory Visit (HOSPITAL_COMMUNITY)
Admission: RE | Admit: 2022-10-03 | Discharge: 2022-10-03 | Disposition: A | Discharge status: Home / Self Care | Payer: PPO | Source: Ambulatory Visit | Attending: Vascular Surgery | Admitting: Vascular Surgery

## 2022-10-03 ENCOUNTER — Encounter: Payer: Self-pay | Admitting: Vascular Surgery

## 2022-10-03 ENCOUNTER — Inpatient Hospital Stay: Payer: PPO

## 2022-10-03 ENCOUNTER — Ambulatory Visit (INDEPENDENT_AMBULATORY_CARE_PROVIDER_SITE_OTHER): Payer: PPO | Admitting: Vascular Surgery

## 2022-10-03 VITALS — BP 143/82 | HR 92 | Temp 97.8°F

## 2022-10-03 DIAGNOSIS — I739 Peripheral vascular disease, unspecified: Secondary | ICD-10-CM

## 2022-10-03 NOTE — Progress Notes (Signed)
[facility] [place]  ASSESSMENT / PLAN: [name] is a 89 y.o. female with atherosclerosis of native arteries of bilateral lower extremities causing no symptoms.  Recommend the following which can slow the progression of atherosclerosis and reduce the risk of major adverse cardiac / limb events:  Complete cessation from all tobacco products. Blood glucose control with goal A1c < 7%. Blood pressure control with goal blood pressure < 140/90 mmHg. Lipid reduction therapy with goal LDL-C <100 mg/dL (<16 if symptomatic from PAD).  Aspirin 81mg  PO QD.  Atorvastatin 40-80mg  PO QD (or other "high intensity" statin therapy).  I counseled the patient that her symptoms seem more typical of a neurologic process such as sciatica or spinal stenosis.  I encouraged her to follow-up with her primary care physician to discuss this further.  She does not have typical symptoms of peripheral arterial disease.  Recommend medical therapy only.  She can follow-up with me on an as-needed basis.  CHIEF COMPLAINT: Follow-up for peripheral arterial disease  HISTORY OF PRESENT ILLNESS: [name] is a 89 y.o. female previously seen by partner, Dr. Arbie Cookey for asymptomatic peripheral arterial disease in 2022.  Patient returns for surveillance of peripheral arterial disease and for evaluation of lower extremity symptoms.  The patient reports episodic "cramping" in her calves and thighs.  These episodes will last about 10 seconds.  They do not appear to be provoked or relieved by any factors.  The symptoms are not related to exercise.  The patient does not walk fast or far enough to suffer claudication.  She does not describe constant severe pain in her feet typical of ischemic rest pain.  She has no ulcers about her feet which will not heal.  She reports the pain radiates from her buttock and low back down her leg occasionally.  Past Medical History:  Diagnosis Date   Anxiety    Arthritis     Atrial fibrillation (HCC)    Bursitis    Left shoulder   Cataract    CHF (congestive heart failure) (HCC)    CKD (chronic kidney disease)    stage 3-4   COPD (chronic obstructive pulmonary disease) (HCC)    Coronary atherosclerosis of native coronary artery    a. s/p DES to LCx in 04/2013 b. cath in 11/2015 showing patent stent with 20% prox-LAD and 80% ostial RCA stenosis for which medical management was recommended due to small artery size   Depression    Diastolic heart failure (HCC)    EF 55-60%   Dysphagia, unspecified(787.20)    Dyspnea    Dysrhythmia    Essential hypertension    GERD (gastroesophageal reflux disease)    Hx Schatzki's ring, multiple EGD/ED last 01/06/2004   Gout    Headache    History of anemia    Hyperlipidemia    Internal hemorrhoids without mention of complication    MI (myocardial infarction) (HCC) 2006   Microscopic colitis 2003   Panic disorder without agoraphobia    Paresthesia    Pneumonia 12/2011   PVD (peripheral vascular disease) (HCC)    S/P colonoscopy 09/27/2001   internal hemorrhoids, desc colon inflam polyp, SB BX-chronic duodenitis, colitis   Sleep apnea    Thyroid disease     Past Surgical History:  Procedure Laterality Date   ABDOMINAL HYSTERECTOMY     ABDOMINAL HYSTERECTOMY     AGILE CAPSULE N/A 08/30/2020   Procedure: AGILE CAPSULE;  Surgeon: Corbin Ade, MD;  [facility];  Service: Endoscopy;  Laterality: N/A;  7:30am   ANTERIOR AND POSTERIOR REPAIR     with resection of vagina   ANTERIOR LAT LUMBAR FUSION N/A 08/01/2016   Procedure: Lumbar Two-Lumbar Five Transpsoas lateral interbody fusion with Lumbar Two-Three lateral plate fixation;  Surgeon: Loura Halt Ditty, MD;  Location: [facility] OR;  Service: Neurosurgery;  Laterality: N/A;  L2-5 Transpsoas lateral interbody fusion with L2-3 lateral plate fixation   APPENDECTOMY     BACK SURGERY     BIOPSY  07/05/2015   Procedure: BIOPSY;  Surgeon: Corbin Ade, MD;   Location: [facility];  Service: Endoscopy;;  gastric polyp biopsy, ascending colon biopsy   BIOPSY  02/16/2020   Procedure: BIOPSY;  Surgeon: Corbin Ade, MD;  [facility];  Service: Endoscopy;;   BIOPSY  06/28/2020   Procedure: BIOPSY;  Surgeon: Corbin Ade, MD;  [facility]: [facility];  Service: Endoscopy;;   BLADDER SUSPENSION  11/09/2011   Procedure: TRANSVAGINAL TAPE (TVT) PROCEDURE;  Surgeon: Ky Barban, MD;  Location: [facility] ORS;  Service: Urology;  Laterality: N/A;   bladder tack  06/2010   BREAST LUMPECTOMY  1998   left, benign   CARDIAC CATHETERIZATION     CARDIAC CATHETERIZATION     CARDIAC CATHETERIZATION N/A 12/16/2015   Procedure: Left Heart Cath and Coronary Angiography;  Surgeon: Lennette Bihari, MD;  Location: [facility] LAB;  Service: Cardiovascular;  Laterality: N/A;   CARDIOVERSION N/A 10/04/2017   Procedure: CARDIOVERSION;  Surgeon: Laqueta Linden, MD;  Location: [facility];  Service: Cardiovascular;  Laterality: N/A;   CARDIOVERSION N/A 01/30/2018   Procedure: CARDIOVERSION;  Surgeon: Laqueta Linden, MD;  [facility];  Service: Cardiovascular;  Laterality: N/A;   CARDIOVERSION N/A 11/10/2019   Procedure: CARDIOVERSION;  Surgeon: Wendall Stade, MD;  Location: [facility] ENDOSCOPY;  Service: Cardiovascular;  Laterality: N/A;   CARPAL TUNNEL RELEASE  1989   left   cataract surgery     CHOLECYSTECTOMY  1998   Cholecystectomy     COLONOSCOPY  03/16/2011   multiple hyperplastic colon polyps, sigmoid diverticulosis, melanosis coli   COLONOSCOPY WITH PROPOFOL N/A 07/05/2015   RMR:one 5 mm polyp in descending colon   COLONOSCOPY WITH PROPOFOL N/A 06/28/2020   Procedure: COLONOSCOPY WITH PROPOFOL;  Surgeon: Corbin Ade, MD;  [facility];  Service: Endoscopy;  Laterality: N/A;  am appt   CORONARY ANGIOGRAPHY N/A 05/16/2018   Procedure: CORONARY ANGIOGRAPHY (CATH LAB);  Surgeon: Lyn Records, MD;  Location: [facility] INVASIVE CV  LAB;  Service: Cardiovascular;  Laterality: N/A;   CORONARY ANGIOPLASTY WITH STENT PLACEMENT  2015   ESOPHAGEAL DILATION N/A 07/05/2015   Procedure: ESOPHAGEAL DILATION;  Surgeon: Corbin Ade, MD;  [facility];  Service: Endoscopy;  Laterality: N/A;   ESOPHAGOGASTRODUODENOSCOPY (EGD) WITH PROPOFOL N/A 07/05/2015   [id number]   ESOPHAGOGASTRODUODENOSCOPY (EGD) WITH PROPOFOL N/A 02/16/2020   Procedure: ESOPHAGOGASTRODUODENOSCOPY (EGD) WITH PROPOFOL;  Surgeon: Corbin Ade, MD;  Normal examined esophagus s/p dilation, erythematous mucosa in the stomach s/p biopsy, normal examined duodenum. Pathology with mild gastropathy, negative for H. Pylori.    GIVENS CAPSULE STUDY N/A 11/10/2020   Procedure: GIVENS CAPSULE STUDY;  Surgeon: Corbin Ade, MD;  [facility];  Service: Endoscopy;  Laterality: N/A;  7:30am   JOINT REPLACEMENT Right 2007   right knee   left hand surgery     LEFT HEART CATHETERIZATION  WITH CORONARY ANGIOGRAM N/A 05/14/2013   Procedure: LEFT HEART CATHETERIZATION WITH CORONARY ANGIOGRAM;  Surgeon: Micheline Chapman, MD;  Location: [facility] CATH LAB;  Service: Cardiovascular;  Laterality: N/A;   left rotator cuff surgery     LUMBAR LAMINECTOMY/DECOMPRESSION MICRODISCECTOMY N/A 10/11/2012   Procedure: LUMBAR LAMINECTOMY/DECOMPRESSION MICRODISCECTOMY 2 LEVELS;  Surgeon: Karn Cassis, MD;  Location: [facility];  Service: Neurosurgery;  Laterality: N/A;  L3-4 L4-5 Laminectomy   LUMBAR WOUND DEBRIDEMENT N/A 09/27/2015   Procedure: Exploration of Lumbar Wound w/ Repair CSF Leak/Lumbar Drain Placement;  Surgeon: Hilda Lias, MD;  Location: [facility] NEURO ORS;  Service: Neurosurgery;  Laterality: N/A;   MALONEY DILATION  03/16/2011   Gastritis. No H.pylori on bx. 68F maloney dilation with disruption of  occult cevical esophageal web   MALONEY DILATION N/A 02/16/2020   Procedure: MALONEY DILATION;  Surgeon: Corbin Ade, MD;  [facility];  Service:  Endoscopy;  Laterality: N/A;   NASAL SINUS SURGERY     right knee replacement  2007   right leg benign tumor     SHOULDER SURGERY Left    TEE WITHOUT CARDIOVERSION N/A 10/04/2017   Procedure: TRANSESOPHAGEAL ECHOCARDIOGRAM (TEE) WITH PROPOFOL;  Surgeon: Laqueta Linden, MD;  Location: [facility];  Service: Cardiovascular;  Laterality: N/A;   TEE WITHOUT CARDIOVERSION N/A 11/10/2019   Procedure: TRANSESOPHAGEAL ECHOCARDIOGRAM (TEE);  Surgeon: Wendall Stade, MD;  Location: [facility] ENDOSCOPY;  Service: Cardiovascular;  Laterality: N/A;   TONSILLECTOMY     unspecified area, hysterectomy  1972   partial    Family History  Problem Relation Age of Onset   Stroke Mother    Parkinson's disease Father    Coronary artery disease Other        family Hx-sons   Cancer Other    Stroke Other        family Hx   Hypertension Other        family Hx   Diabetes Brother    Heart disease Son        before age 107   Diabetes Son    Stroke Daughter 70   Colon cancer Grandson        diagnosed 40   Inflammatory bowel disease Neg Hx     Social History   Socioeconomic History   Marital status: Divorced    Spouse name: Not on file   Number of children: 5   Years of education: Not on file   Highest education level: Not on file  Occupational History   Occupation: retired  Tobacco Use   Smoking status: Former    Packs/day: 1.00    Years: 64.00    Additional pack years: 0.00    Total pack years: 64.00    Types: Cigarettes    Start date: 12/24/1947    Quit date: 11/17/2001    Years since quitting: 20.8   Smokeless tobacco: Never   Tobacco comments:    Quit smoking in 2003  Vaping Use   Vaping Use: Never used  Substance and Sexual Activity   Alcohol use: Not Currently   Drug use: No   Sexual activity: Never  Other Topics Concern   Not on file  Social History Narrative   Divorced.   Sister had colon perforation & died from complications in [place], [place]   Social Determinants of Health    Financial Resource Strain: Not on file  Food Insecurity: Not on file  Transportation Needs: Not on file  Physical Activity: Not  on file  Stress: Not on file  Social Connections: Not on file  Intimate Partner Violence: Not on file    Allergies  Allergen Reactions   Cephalosporins Diarrhea and Nausea Only    Lightheaded   Levaquin [Levofloxacin In D5w] Swelling   Macrodantin [Nitrofurantoin Macrocrystal] Swelling   Phenothiazines Anaphylaxis and Hives   Polysorbate Anaphylaxis   Prednisone Shortness Of Breath   Buspirone Itching   Cardura [Doxazosin Mesylate] Itching   Codeine Itching   Acyclovir And Related Itching    Redness of skin   Colcrys [Colchicine] Nausea Only    Upset stomach    Prochlorperazine Other (See Comments)    "Upset stomach"   Ranexa [Ranolazine]     Severe drop in BP   Atorvastatin Hives    Cramping; tolerates Crestor ok   Colestipol Palpitations   Ofloxacin Rash   Other Itching and Rash    "WOOL"= make skin look like it has been burned   Penicillins Other (See Comments)    Causes redness all over. Has patient had a PCN reaction causing immediate rash, facial/tongue/throat swelling, SOB or lightheadedness with hypotension: No Has patient had a PCN reaction causing severe rash involving mucus membranes or skin necrosis: No Has patient had a PCN reaction that required hospitalization No Has patient had a PCN reaction occurring within the last 10 years: No If all of the above answers are "NO", then may proceed with Cephalosporin use.    Pimozide Hives and Itching    Current Outpatient Medications  Medication Sig Dispense Refill   acetaminophen (TYLENOL) 500 MG tablet Take 500 mg by mouth every 6 (six) hours as needed for headache or moderate pain.     ALPRAZolam (XANAX) 0.5 MG tablet Take 0.5 mg by mouth daily as needed for anxiety or sleep.     apixaban (ELIQUIS) 5 MG TABS tablet Take 1 tablet (5 mg total) by mouth 2 (two) times daily. 180 tablet  3   ascorbic acid (VITAMIN C) 500 MG tablet Take 1 tablet (500 mg total) by mouth daily. 30 tablet 2   diltiazem (CARDIZEM CD) 120 MG 24 hr capsule TAKE ONE CAPSULE BY MOUTH DAILY 90 capsule 3   DULoxetine (CYMBALTA) 30 MG capsule Take 30 mg by mouth daily.     ezetimibe (ZETIA) 10 MG tablet Take 1 tablet (10 mg total) by mouth daily. 90 tablet 3   fluticasone (CUTIVATE) 0.05 % cream Apply 1 application topically 2 (two) times daily as needed (skin tears).     furosemide (LASIX) 40 MG tablet Take 40 mg by mouth daily.     hydrocortisone (ANUSOL-HC) 2.5 % rectal cream Place 1 application rectally 2 (two) times daily. 30 g 1   isosorbide mononitrate (IMDUR) 60 MG 24 hr tablet Take 1.5 tablets (90 mg total) by mouth daily. 135 tablet 6   magnesium oxide (MAG-OX) 400 (240 Mg) MG tablet Take 1 tablet by mouth daily.     metoprolol succinate (TOPROL-XL) 50 MG 24 hr tablet Take 1 tablet (50 mg total) by mouth 2 (two) times daily. Take with or immediately following a meal. 180 tablet 3   Multiple Vitamins-Minerals (HAIR/SKIN/NAILS) CAPS Take 1 tablet by mouth daily.     Naphazoline HCl (CLEAR EYES OP) Place 1 drop into both eyes daily as needed (itching).     nitroGLYCERIN (NITROSTAT) 0.4 MG SL tablet DISSOLVE ONE TABLET UNDER TONGUE EVERY 5 MINUTES UP TO 3 DOSES AS NEEDED FOR CHEST PAIN. 25 tablet 6  pantoprazole (PROTONIX) 40 MG tablet TAKE ONE TABLET BY MOUTH TWICE DAILY BEFORE MEALS 60 tablet 5   potassium chloride (MICRO-K) 10 MEQ CR capsule Take 10 mEq by mouth daily.     traMADol (ULTRAM) 50 MG tablet Take 1 tablet (50 mg total) by mouth every 6 (six) hours as needed. 90 tablet 3   triamcinolone cream (KENALOG) 0.1 % Apply topically 2 (two) times daily.     zinc sulfate 220 (50 Zn) MG capsule Take 1 capsule (220 mg total) by mouth daily. 30 capsule 3   No current facility-administered medications for this visit.    PHYSICAL EXAM There were no vitals filed for this visit.  Elderly woman.  In  a wheelchair.  No distress Regular rate and rhythm Unlabored breathing No palpable pedal pulses.  Feet are warm.  Scattered reticular veins across the lower extremities bilaterally.  PERTINENT LABORATORY AND RADIOLOGIC DATA  Most recent CBC    Latest Ref Rng & Units 06/02/2022   11:45 AM 12/27/2021    1:33 PM 07/21/2021   10:27 AM  CBC  WBC 4.0 - 10.5 K/uL 9.8  9.7  5.7   Hemoglobin 12.0 - 15.0 g/dL 16.1  09.6  04.5   Hematocrit 36.0 - 46.0 % 39.0  39.7  38.4   Platelets 150 - 400 K/uL 356  291  273      Most recent CMP    Latest Ref Rng & Units 06/02/2022   11:45 AM 12/14/2020    3:15 PM 12/07/2020    3:56 PM  CMP  Glucose 70 - 99 mg/dL 409   811   BUN 8 - 23 mg/dL 36   24   Creatinine 9.14 - 1.00 mg/dL 7.82   9.56   Sodium 213 - 145 mmol/L 134   135   Potassium 3.5 - 5.1 mmol/L 4.2   3.7   Chloride 98 - 111 mmol/L 99   96   CO2 22 - 32 mmol/L 25   28   Calcium 8.9 - 10.3 mg/dL 9.1   9.5   Total Protein 6.0 - 8.5 g/dL  7.4  7.6   Total Bilirubin 0.3 - 1.2 mg/dL   0.5   Alkaline Phos 38 - 126 U/L   99   AST 15 - 41 U/L   26   ALT 0 - 44 U/L   17     Renal function CrCl cannot be calculated (Patient's most recent lab result is older than the maximum 21 days allowed.).  Hgb A1c MFr Bld (%)  Date Value  09/30/2020 7.1 (H)    LDL Cholesterol  Date Value Ref Range Status  11/10/2019 98 0 - 99 mg/dL Final    Comment:           Total Cholesterol/HDL:CHD Risk Coronary Heart Disease Risk Table                     Men   Women  1/2 Average Risk   3.4   3.3  Average Risk       5.0   4.4  2 X Average Risk   9.6   7.1  3 X Average Risk  23.4   11.0        Use the calculated Patient Ratio above and the CHD Risk Table to determine the patient's CHD Risk.        ATP III CLASSIFICATION (LDL):  <100     mg/dL   Optimal  100-129  mg/dL   Near or Above                    Optimal  130-159  mg/dL   Borderline  161-096  mg/dL   High  >045     mg/dL   Very High Performed at  [facility], [address], [place], [place] [address]      +-------+-----------+-----------+------------+------------+  ABI/TBIToday's ABIToday's TBIPrevious ABIPrevious TBI  +-------+-----------+-----------+------------+------------+  Right 0.76       0.31       0.77        0.47          +-------+-----------+-----------+------------+------------+  Left  0.82       0.49       0.73        0.46          +-------+-----------+-----------+------------+------------+   Rande Brunt. Lenell Antu, MD FACS [facility] Phone Number: [phone] 10/03/2022 7:47 AM   Total time spent on preparing this encounter including chart review, data review, collecting history, examining the patient, coordinating care for this established patient, 40 minutes.  Portions of this report may have been transcribed using voice recognition software.  Every effort has been made to ensure accuracy; however, inadvertent computerized transcription errors may still be present.

## 2022-10-04 ENCOUNTER — Inpatient Hospital Stay: Payer: PPO | Attending: Physician Assistant

## 2022-10-04 DIAGNOSIS — Z87891 Personal history of nicotine dependence: Secondary | ICD-10-CM | POA: Insufficient documentation

## 2022-10-04 DIAGNOSIS — Z800 Family history of malignant neoplasm of digestive organs: Secondary | ICD-10-CM | POA: Insufficient documentation

## 2022-10-04 DIAGNOSIS — I4891 Unspecified atrial fibrillation: Secondary | ICD-10-CM | POA: Diagnosis not present

## 2022-10-04 DIAGNOSIS — D500 Iron deficiency anemia secondary to blood loss (chronic): Secondary | ICD-10-CM | POA: Diagnosis not present

## 2022-10-04 DIAGNOSIS — Z7901 Long term (current) use of anticoagulants: Secondary | ICD-10-CM | POA: Diagnosis not present

## 2022-10-04 DIAGNOSIS — Z90710 Acquired absence of both cervix and uterus: Secondary | ICD-10-CM | POA: Insufficient documentation

## 2022-10-04 DIAGNOSIS — K644 Residual hemorrhoidal skin tags: Secondary | ICD-10-CM | POA: Diagnosis not present

## 2022-10-04 LAB — CBC WITH DIFFERENTIAL/PLATELET
Abs Immature Granulocytes: 0.07 10*3/uL (ref 0.00–0.07)
Basophils Absolute: 0.1 10*3/uL (ref 0.0–0.1)
Basophils Relative: 1 %
Eosinophils Absolute: 0.2 10*3/uL (ref 0.0–0.5)
Eosinophils Relative: 3 %
HCT: 42 % (ref 36.0–46.0)
Hemoglobin: 14 g/dL (ref 12.0–15.0)
Immature Granulocytes: 1 %
Lymphocytes Relative: 29 %
Lymphs Abs: 2.5 10*3/uL (ref 0.7–4.0)
MCH: 30.8 pg (ref 26.0–34.0)
MCHC: 33.3 g/dL (ref 30.0–36.0)
MCV: 92.3 fL (ref 80.0–100.0)
Monocytes Absolute: 0.7 10*3/uL (ref 0.1–1.0)
Monocytes Relative: 9 %
Neutro Abs: 5 10*3/uL (ref 1.7–7.7)
Neutrophils Relative %: 57 %
Platelets: 330 10*3/uL (ref 150–400)
RBC: 4.55 MIL/uL (ref 3.87–5.11)
RDW: 13.8 % (ref 11.5–15.5)
WBC: 8.6 10*3/uL (ref 4.0–10.5)
nRBC: 0 % (ref 0.0–0.2)

## 2022-10-04 LAB — VAS US ABI WITH/WO TBI
Left ABI: 0.82
Right ABI: 0.76

## 2022-10-04 LAB — IRON AND TIBC
Iron: 57 ug/dL (ref 28–170)
Saturation Ratios: 15 % (ref 10.4–31.8)
TIBC: 377 ug/dL (ref 250–450)
UIBC: 320 ug/dL

## 2022-10-04 LAB — FERRITIN: Ferritin: 73 ng/mL (ref 11–307)

## 2022-10-09 NOTE — Progress Notes (Addendum)
[facility] [address], [place] [address]   CLINIC:  Medical Oncology/Hematology  PCP:  Sheela Stack [address] [place] [address] [phone]   REASON FOR VISIT:  Follow-up for iron deficiency anemia   CURRENT THERAPY: Intermittent IV iron infusions (last given 09/06/2021)  INTERVAL HISTORY:   [name] 89 y.o. female returns for routine follow-up of iron deficiency anemia.  She was last seen by Rojelio Brenner PA-C on 01/03/2022, but was then lost to follow-up.  At today's visit, she reports feeling fair. She has not noticed any recent rectal bleeding, but has had some intermittent hemorrhoid bleeding in the past.  She had some mild nosebleeds this winter, but no "gushing" epistaxis.   No melena.  She takes Eliquis for her A-fib.  She reports progressive fatigue, dyspnea on exertion, and shortness of breath at rest.  She is having leg cramps.  She has some mild ice pica.  She has intermittent headaches. She has intermittent chest pain.   She has multiple other complaints unrelated to today's visit, which are further documented in ROS. She has 10% energy and 10% appetite. She endorses that she is maintaining a stable weight.   ASSESSMENT & PLAN:  1.  Iron deficiency anemia due to chronic GI blood loss - Iron deficiency anemia secondary to GI blood loss from hemorrhoids - She follows with GI, was also evaluated by general surgery but is not a candidate for surgical intervention or banding - She takes Eliquis in the setting of atrial fibrillation - Colonoscopy (06/28/2020): Internal and external hemorrhoids - EGD (02/16/2020): Normal esophagus, erythematous mucosa of the stomach, normal duodenum - She could not tolerate iron supplement due to constipation and lack of improvement. - Last iron was Feraheme x2 on 08/30/2021 and 09/06/2021 - She has had worsening fatigue and dyspnea on exertion for the past month - Most recent labs  (10/04/2022): Hgb 14.0/MCV 92.3, ferritin 73, iron saturation 15% - PLAN: Recommend IV Feraheme x 1  - Repeat labs and RTC in 4 months followed by office visit - We will defer management of hemorrhoids to gastroenterology and surgery.  PLAN SUMMARY: >> IV Feraheme x 1 >> Labs in 4 months = CBC/D, BMP, ferritin, iron/TIBC, B12, MMA >> OFFICE visit in 4 months (1 week after labs)     REVIEW OF SYSTEMS:   Review of Systems  Constitutional:  Positive for fatigue. Negative for appetite change, chills, diaphoresis, fever and unexpected weight change.  HENT:   Negative for lump/mass and nosebleeds.   Eyes:  Negative for eye problems.  Respiratory:  Positive for cough and shortness of breath. Negative for hemoptysis.   Cardiovascular:  Positive for chest pain and palpitations. Negative for leg swelling.  Gastrointestinal:  Positive for constipation. Negative for abdominal pain, blood in stool, diarrhea, nausea and vomiting.  Genitourinary:  Positive for bladder incontinence and dysuria. Negative for hematuria.   Musculoskeletal:  Positive for arthralgias, back pain, myalgias and neck pain.  Skin:  Positive for itching and rash.  Neurological:  Positive for dizziness, headaches and numbness. Negative for light-headedness.  Hematological:  Does not bruise/bleed easily.  Psychiatric/Behavioral:  Positive for sleep disturbance.      PHYSICAL EXAM:  ECOG PERFORMANCE STATUS: 2 - Symptomatic, <50% confined to bed  There were no vitals filed for this visit. There were no vitals filed for this visit. Physical Exam Constitutional:      Appearance: Normal appearance. She is obese.  Cardiovascular:  Rate and Rhythm: Rhythm irregular.     Heart sounds: Normal heart sounds.  Pulmonary:     Breath sounds: Normal breath sounds.  Neurological:     General: No focal deficit present.     Mental Status: Mental status is at baseline.  Psychiatric:        Behavior: Behavior normal. Behavior is  cooperative.     PAST MEDICAL/SURGICAL HISTORY:  Past Medical History:  Diagnosis Date   Anxiety    Arthritis    Atrial fibrillation (HCC)    Bursitis    Left shoulder   Cataract    CHF (congestive heart failure) (HCC)    CKD (chronic kidney disease)    stage 3-4   COPD (chronic obstructive pulmonary disease) (HCC)    Coronary atherosclerosis of native coronary artery    a. s/p DES to LCx in 04/2013 b. cath in 11/2015 showing patent stent with 20% prox-LAD and 80% ostial RCA stenosis for which medical management was recommended due to small artery size   Depression    Diastolic heart failure (HCC)    EF 55-60%   Dysphagia, unspecified(787.20)    Dyspnea    Dysrhythmia    Essential hypertension    GERD (gastroesophageal reflux disease)    Hx Schatzki's ring, multiple EGD/ED last 01/06/2004   Gout    Headache    History of anemia    Hyperlipidemia    Internal hemorrhoids without mention of complication    MI (myocardial infarction) (HCC) 2006   Microscopic colitis 2003   Panic disorder without agoraphobia    Paresthesia    Pneumonia 12/2011   PVD (peripheral vascular disease) (HCC)    S/P colonoscopy 09/27/2001   internal hemorrhoids, desc colon inflam polyp, SB BX-chronic duodenitis, colitis   Sleep apnea    Thyroid disease    Past Surgical History:  Procedure Laterality Date   ABDOMINAL HYSTERECTOMY     ABDOMINAL HYSTERECTOMY     AGILE CAPSULE N/A 08/30/2020   Procedure: AGILE CAPSULE;  Surgeon: Corbin Ade, MD;  [facility]: [facility];  Service: Endoscopy;  Laterality: N/A;  7:30am   ANTERIOR AND POSTERIOR REPAIR     with resection of vagina   ANTERIOR LAT LUMBAR FUSION N/A 08/01/2016   Procedure: Lumbar Two-Lumbar Five Transpsoas lateral interbody fusion with Lumbar Two-Three lateral plate fixation;  Surgeon: Loura Halt Ditty, MD;  Location: [facility] OR;  Service: Neurosurgery;  Laterality: N/A;  L2-5 Transpsoas lateral interbody fusion with L2-3 lateral plate  fixation   APPENDECTOMY     BACK SURGERY     BIOPSY  07/05/2015   Procedure: BIOPSY;  Surgeon: Corbin Ade, MD;  [facility]: [facility];  Service: Endoscopy;;  gastric polyp biopsy, ascending colon biopsy   BIOPSY  02/16/2020   Procedure: BIOPSY;  Surgeon: Corbin Ade, MD;  [facility];  Service: Endoscopy;;   BIOPSY  06/28/2020   Procedure: BIOPSY;  Surgeon: Corbin Ade, MD;  [facility];  Service: Endoscopy;;   BLADDER SUSPENSION  11/09/2011   Procedure: TRANSVAGINAL TAPE (TVT) PROCEDURE;  Surgeon: Ky Barban, MD;  Location: [facility] ORS;  Service: Urology;  Laterality: N/A;   bladder tack  06/2010   BREAST LUMPECTOMY  1998   left, benign   CARDIAC CATHETERIZATION     CARDIAC CATHETERIZATION     CARDIAC CATHETERIZATION N/A 12/16/2015   Procedure: Left Heart Cath and Coronary Angiography;  Surgeon: Lennette Bihari, MD;  Location: [facility] INVASIVE CV  LAB;  Service: Cardiovascular;  Laterality: N/A;   CARDIOVERSION N/A 10/04/2017   Procedure: CARDIOVERSION;  Surgeon: Laqueta Linden, MD;  Location: [facility] ORS;  Service: Cardiovascular;  Laterality: N/A;   CARDIOVERSION N/A 01/30/2018   Procedure: CARDIOVERSION;  Surgeon: Laqueta Linden, MD;  [facility];  Service: Cardiovascular;  Laterality: N/A;   CARDIOVERSION N/A 11/10/2019   Procedure: CARDIOVERSION;  Surgeon: Wendall Stade, MD;  Location: [facility] ENDOSCOPY;  Service: Cardiovascular;  Laterality: N/A;   CARPAL TUNNEL RELEASE  1989   left   cataract surgery     CHOLECYSTECTOMY  1998   Cholecystectomy     COLONOSCOPY  03/16/2011   multiple hyperplastic colon polyps, sigmoid diverticulosis, melanosis coli   COLONOSCOPY WITH PROPOFOL N/A 07/05/2015   RMR:one 5 mm polyp in descending colon   COLONOSCOPY WITH PROPOFOL N/A 06/28/2020   Procedure: COLONOSCOPY WITH PROPOFOL;  Surgeon: Corbin Ade, MD;  [facility];  Service: Endoscopy;  Laterality: N/A;  am appt   CORONARY ANGIOGRAPHY  N/A 05/16/2018   Procedure: CORONARY ANGIOGRAPHY (CATH LAB);  Surgeon: Lyn Records, MD;  Location[facility] INVASIVE CV LAB;  Service: Cardiovascular;  Laterality: N/A;   CORONARY ANGIOPLASTY WITH STENT PLACEMENT  2015   ESOPHAGEAL DILATION N/A 07/05/2015   Procedure: ESOPHAGEAL DILATION;  Surgeon: Corbin Ade, MD;  [facility];  Service: Endoscopy;  Laterality: N/A;   ESOPHAGOGASTRODUODENOSCOPY (EGD) WITH PROPOFOL N/A 07/05/2015   ONG:EXBMWU   ESOPHAGOGASTRODUODENOSCOPY (EGD) WITH PROPOFOL N/A 02/16/2020   Procedure: ESOPHAGOGASTRODUODENOSCOPY (EGD) WITH PROPOFOL;  Surgeon: Corbin Ade, MD;  Normal examined esophagus s/p dilation, erythematous mucosa in the stomach s/p biopsy, normal examined duodenum. Pathology with mild gastropathy, negative for H. Pylori.    GIVENS CAPSULE STUDY N/A 11/10/2020   Procedure: GIVENS CAPSULE STUDY;  Surgeon: Corbin Ade, MD;  [facility];  Service: Endoscopy;  Laterality: N/A;  7:30am   JOINT REPLACEMENT Right 2007   right knee   left hand surgery     LEFT HEART CATHETERIZATION WITH CORONARY ANGIOGRAM N/A 05/14/2013   Procedure: LEFT HEART CATHETERIZATION WITH CORONARY ANGIOGRAM;  Surgeon: Micheline Chapman, MD;  Location: [facility] CATH LAB;  Service: Cardiovascular;  Laterality: N/A;   left rotator cuff surgery     LUMBAR LAMINECTOMY/DECOMPRESSION MICRODISCECTOMY N/A 10/11/2012   Procedure: LUMBAR LAMINECTOMY/DECOMPRESSION MICRODISCECTOMY 2 LEVELS;  Surgeon: Karn Cassis, MD;  Location: [facility] NEURO ORS;  Service: Neurosurgery;  Laterality: N/A;  L3-4 L4-5 Laminectomy   LUMBAR WOUND DEBRIDEMENT N/A 09/27/2015   Procedure: Exploration of Lumbar Wound w/ Repair CSF Leak/Lumbar Drain Placement;  Surgeon: Hilda Lias, MD;  Location: [facility] NEURO ORS;  Service: Neurosurgery;  Laterality: N/A;   MALONEY DILATION  03/16/2011   Gastritis. No H.pylori on bx. 60F maloney dilation with disruption of  occult cevical esophageal web   MALONEY DILATION N/A  02/16/2020   Procedure: MALONEY DILATION;  Surgeon: Corbin Ade, MD;  [facility];  Service: Endoscopy;  Laterality: N/A;   NASAL SINUS SURGERY     right knee replacement  2007   right leg benign tumor     SHOULDER SURGERY Left    TEE WITHOUT CARDIOVERSION N/A 10/04/2017   Procedure: TRANSESOPHAGEAL ECHOCARDIOGRAM (TEE) WITH PROPOFOL;  Surgeon: Laqueta Linden, MD;  Location: [facility];  Service: Cardiovascular;  Laterality: N/A;   TEE WITHOUT CARDIOVERSION N/A 11/10/2019   Procedure: TRANSESOPHAGEAL ECHOCARDIOGRAM (TEE);  Surgeon: Wendall Stade, MD;  Location: [facility] ENDOSCOPY;  Service: Cardiovascular;  Laterality: N/A;   TONSILLECTOMY     unspecified area, hysterectomy  1972   partial    SOCIAL HISTORY:  Social History   Socioeconomic History   Marital status: Divorced    Spouse name: Not on file   Number of children: 5   Years of education: Not on file   Highest education level: Not on file  Occupational History   Occupation: retired  Tobacco Use   Smoking status: Former    Packs/day: 1.00    Years: 64.00    Additional pack years: 0.00    Total pack years: 64.00    Types: Cigarettes    Start date: 12/24/1947    Quit date: 11/17/2001    Years since quitting: 20.9   Smokeless tobacco: Never   Tobacco comments:    Quit smoking in 2003  Vaping Use   Vaping Use: Never used  Substance and Sexual Activity   Alcohol use: Not Currently   Drug use: No   Sexual activity: Never  Other Topics Concern   Not on file  Social History Narrative   Divorced.   Sister had colon perforation & died from complications in [place], [place]   Social Determinants of Health   Financial Resource Strain: Not on file  Food Insecurity: Not on file  Transportation Needs: Not on file  Physical Activity: Not on file  Stress: Not on file  Social Connections: Not on file  Intimate Partner Violence: Not on file    FAMILY HISTORY:  Family History  Problem Relation Age of Onset   Stroke  Mother    Parkinson's disease Father    Coronary artery disease Other        family Hx-sons   Cancer Other    Stroke Other        family Hx   Hypertension Other        family Hx   Diabetes Brother    Heart disease Son        before age 27   Diabetes Son    Stroke Daughter 53   Colon cancer Grandson        diagnosed 40   Inflammatory bowel disease Neg Hx     CURRENT MEDICATIONS:  Outpatient Encounter Medications as of 10/10/2022  Medication Sig   acetaminophen (TYLENOL) 500 MG tablet Take 500 mg by mouth every 6 (six) hours as needed for headache or moderate pain.   ALPRAZolam (XANAX) 0.5 MG tablet Take 0.5 mg by mouth daily as needed for anxiety or sleep.   apixaban (ELIQUIS) 5 MG TABS tablet Take 1 tablet (5 mg total) by mouth 2 (two) times daily.   ascorbic acid (VITAMIN C) 500 MG tablet Take 1 tablet (500 mg total) by mouth daily.   diltiazem (CARDIZEM CD) 120 MG 24 hr capsule TAKE ONE CAPSULE BY MOUTH DAILY   DULoxetine (CYMBALTA) 30 MG capsule Take 30 mg by mouth daily.   ezetimibe (ZETIA) 10 MG tablet Take 1 tablet (10 mg total) by mouth daily.   fluticasone (CUTIVATE) 0.05 % cream Apply 1 application topically 2 (two) times daily as needed (skin tears).   furosemide (LASIX) 40 MG tablet Take 40 mg by mouth daily.   hydrocortisone (ANUSOL-HC) 2.5 % rectal cream Place 1 application rectally 2 (two) times daily.   isosorbide mononitrate (IMDUR) 60 MG 24 hr tablet Take 1.5 tablets (90 mg total) by mouth daily.   magnesium oxide (MAG-OX) 400 (240 Mg) MG tablet Take 1 tablet by  mouth daily.   metoprolol succinate (TOPROL-XL) 50 MG 24 hr tablet Take 1 tablet (50 mg total) by mouth 2 (two) times daily. Take with or immediately following a meal.   Multiple Vitamins-Minerals (HAIR/SKIN/NAILS) CAPS Take 1 tablet by mouth daily.   Naphazoline HCl (CLEAR EYES OP) Place 1 drop into both eyes daily as needed (itching).   nitroGLYCERIN (NITROSTAT) 0.4 MG SL tablet DISSOLVE ONE TABLET UNDER  TONGUE EVERY 5 MINUTES UP TO 3 DOSES AS NEEDED FOR CHEST PAIN.   pantoprazole (PROTONIX) 40 MG tablet TAKE ONE TABLET BY MOUTH TWICE DAILY BEFORE MEALS   potassium chloride (MICRO-K) 10 MEQ CR capsule Take 10 mEq by mouth daily.   traMADol (ULTRAM) 50 MG tablet Take 1 tablet (50 mg total) by mouth every 6 (six) hours as needed.   triamcinolone cream (KENALOG) 0.1 % Apply topically 2 (two) times daily.   zinc sulfate 220 (50 Zn) MG capsule Take 1 capsule (220 mg total) by mouth daily.   No facility-administered encounter medications on file as of 10/10/2022.    ALLERGIES:  Allergies  Allergen Reactions   Cephalosporins Diarrhea and Nausea Only    Lightheaded   Levaquin [Levofloxacin In D5w] Swelling   Macrodantin [Nitrofurantoin Macrocrystal] Swelling   Phenothiazines Anaphylaxis and Hives   Polysorbate Anaphylaxis   Prednisone Shortness Of Breath   Buspirone Itching   Cardura [Doxazosin Mesylate] Itching   Codeine Itching   Acyclovir And Related Itching    Redness of skin   Colcrys [Colchicine] Nausea Only    Upset stomach    Prochlorperazine Other (See Comments)    "Upset stomach"   Ranexa [Ranolazine]     Severe drop in BP   Atorvastatin Hives    Cramping; tolerates Crestor ok   Colestipol Palpitations   Ofloxacin Rash   Other Itching and Rash    "WOOL"= make skin look like it has been burned   Penicillins Other (See Comments)    Causes redness all over. Has patient had a PCN reaction causing immediate rash, facial/tongue/throat swelling, SOB or lightheadedness with hypotension: No Has patient had a PCN reaction causing severe rash involving mucus membranes or skin necrosis: No Has patient had a PCN reaction that required hospitalization No Has patient had a PCN reaction occurring within the last 10 years: No If all of the above answers are "NO", then may proceed with Cephalosporin use.    Pimozide Hives and Itching    LABORATORY DATA:  I have reviewed the labs as  listed.  CBC    Component Value Date/Time   WBC 8.6 10/04/2022 1442   RBC 4.55 10/04/2022 1442   HGB 14.0 10/04/2022 1442   HCT 42.0 10/04/2022 1442   PLT 330 10/04/2022 1442   MCV 92.3 10/04/2022 1442   MCH 30.8 10/04/2022 1442   MCHC 33.3 10/04/2022 1442   RDW 13.8 10/04/2022 1442   LYMPHSABS 2.5 10/04/2022 1442   MONOABS 0.7 10/04/2022 1442   EOSABS 0.2 10/04/2022 1442   BASOSABS 0.1 10/04/2022 1442      Latest Ref Rng & Units 06/02/2022   11:45 AM 12/14/2020    3:15 PM 12/07/2020    3:56 PM  CMP  Glucose 70 - 99 mg/dL 657   846   BUN 8 - 23 mg/dL 36   24   Creatinine 9.62 - 1.00 mg/dL 9.52   8.41   Sodium 324 - 145 mmol/L 134   135   Potassium 3.5 - 5.1 mmol/L 4.2   3.7  Chloride 98 - 111 mmol/L 99   96   CO2 22 - 32 mmol/L 25   28   Calcium 8.9 - 10.3 mg/dL 9.1   9.5   Total Protein 6.0 - 8.5 g/dL  7.4  7.6   Total Bilirubin 0.3 - 1.2 mg/dL   0.5   Alkaline Phos 38 - 126 U/L   99   AST 15 - 41 U/L   26   ALT 0 - 44 U/L   17     DIAGNOSTIC IMAGING:  I have independently reviewed the relevant imaging and discussed with the patient.   WRAP UP:  All questions were answered. The patient knows to call the clinic with any problems, questions or concerns.  Medical decision making: Moderate  Time spent on visit: I spent 20 minutes counseling the patient face to face. The total time spent in the appointment was 30 minutes and more than 50% was on counseling.  Carnella Guadalajara, PA-C  10/10/22 1:38 PM

## 2022-10-10 ENCOUNTER — Inpatient Hospital Stay (HOSPITAL_BASED_OUTPATIENT_CLINIC_OR_DEPARTMENT_OTHER): Payer: PPO | Admitting: Physician Assistant

## 2022-10-10 VITALS — BP 131/67 | HR 116 | Temp 97.9°F | Resp 18

## 2022-10-10 DIAGNOSIS — D500 Iron deficiency anemia secondary to blood loss (chronic): Secondary | ICD-10-CM | POA: Diagnosis not present

## 2022-10-10 NOTE — Addendum Note (Signed)
Addended by: Rojelio Brenner on: 10/10/2022 01:43 PM   Modules accepted: Orders

## 2022-10-10 NOTE — Patient Instructions (Signed)
[facility] at [facility] Discharge Instructions  You were seen today by Rojelio Brenner PA-C for your iron deficiency anemia.  Your blood levels look great, but your iron levels are too low.  We will schedule you for IV iron x 1 dose.  We will check your labs and see you again in 4 months to see if you need more IV iron at that time.  LABS: Return in 4 months for repeat labs  FOLLOW-UP APPOINTMENT: Office visit in 4 months, after labs  ** Thank you for trusting me with your healthcare!  I strive to provide all of my patients with quality care at each visit.  If you receive a survey for this visit, I would be so grateful to you for taking the time to provide feedback.  Thank you in advance!  ~ Jakarri Lesko                   Dr. Doreatha Massed   &   Rojelio Brenner, PA-C   - - - - - - - - - - - - - - - - - -     Thank you for choosing [facility] at [facility] to provide your oncology and hematology care.  To afford each patient quality time with our provider, please arrive at least 15 minutes before your scheduled appointment time.   If you have a lab appointment with the Cancer Center please come in thru the Main Entrance and check in at the main information desk.  You need to re-schedule your appointment should you arrive 10 or more minutes late.  We strive to give you quality time with our providers, and arriving late affects you and other patients whose appointments are after yours.  Also, if you no show three or more times for appointments you may be dismissed from the clinic at the providers discretion.     Again, thank you for choosing [facility]  Our hope is that these requests will decrease the amount of time that you wait before being seen by our physicians.       _____________________________________________________________  Should you have questions after your visit to [facility], please contact our  office at [phone] and follow the prompts.  Our office hours are 8:00 a.m. and 4:30 p.m. Monday - Friday.  Please note that voicemails left after 4:00 p.m. may not be returned until the following business day.  We are closed weekends and major holidays.  You do have access to a nurse 24-7, just call the main number to the clinic [phone] and do not press any options, hold on the line and a nurse will answer the phone.    For prescription refill requests, have your pharmacy contact our office and allow 72 hours.    Due to Covid, you will need to wear a mask upon entering the hospital. If you do not have a mask, a mask will be given to you at the Main Entrance upon arrival. For doctor visits, patients may have 1 support person age 106 or older with them. For treatment visits, patients can not have anyone with them due to social distancing guidelines and our immunocompromised population.

## 2022-10-12 ENCOUNTER — Inpatient Hospital Stay: Payer: PPO

## 2022-10-12 VITALS — BP 124/68 | HR 60 | Temp 97.4°F | Resp 16

## 2022-10-12 DIAGNOSIS — D500 Iron deficiency anemia secondary to blood loss (chronic): Secondary | ICD-10-CM

## 2022-10-12 MED ORDER — SODIUM CHLORIDE 0.9 % IV SOLN
510.0000 mg | Freq: Once | INTRAVENOUS | Status: AC
  Administered 2022-10-12: 510 mg via INTRAVENOUS
  Filled 2022-10-12: qty 17

## 2022-10-12 MED ORDER — ACETAMINOPHEN 325 MG PO TABS
650.0000 mg | ORAL_TABLET | Freq: Once | ORAL | Status: AC
  Administered 2022-10-12: 650 mg via ORAL
  Filled 2022-10-12: qty 2

## 2022-10-12 MED ORDER — SODIUM CHLORIDE 0.9 % IV SOLN: Freq: Once | INTRAVENOUS | Status: AC

## 2022-10-12 NOTE — Patient Instructions (Signed)
[facility]  Discharge Instructions: Thank you for choosing [facility] to provide your oncology and hematology care.  If you have a lab appointment with the Cancer Center - please note that after April 8th, 2024, all labs will be drawn in the cancer center.  You do not have to check in or register with the main entrance as you have in the past but will complete your check-in in the cancer center.  Wear comfortable clothing and clothing appropriate for easy access to any Portacath or PICC line.   We strive to give you quality time with your provider. You may need to reschedule your appointment if you arrive late (15 or more minutes).  Arriving late affects you and other patients whose appointments are after yours.  Also, if you miss three or more appointments without notifying the office, you may be dismissed from the clinic at the provider's discretion.      For prescription refill requests, have your pharmacy contact our office and allow 72 hours for refills to be completed.    Today you received the following chemotherapy and/or immunotherapy agents Feraheme. Ferumoxytol Injection What is this medication? FERUMOXYTOL (FER ue MOX i tol) treats low levels of iron in your body (iron deficiency anemia). Iron is a mineral that plays an important role in making red blood cells, which carry oxygen from your lungs to the rest of your body. This medicine may be used for other purposes; ask your health care provider or pharmacist if you have questions. COMMON BRAND NAME(S): Feraheme What should I tell my care team before I take this medication? They need to know if you have any of these conditions: Anemia not caused by low iron levels High levels of iron in the blood Magnetic resonance imaging (MRI) test scheduled An unusual or allergic reaction to iron, other medications, foods, dyes, or preservatives Pregnant or trying to get pregnant Breastfeeding How should I  use this medication? This medication is injected into a vein. It is given by your care team in a hospital or clinic setting. Talk to your care team the use of this medication in children. Special care may be needed. Overdosage: If you think you have taken too much of this medicine contact a poison control center or emergency room at once. NOTE: This medicine is only for you. Do not share this medicine with others. What if I miss a dose? It is important not to miss your dose. Call your care team if you are unable to keep an appointment. What may interact with this medication? Other iron products This list may not describe all possible interactions. Give your health care provider a list of all the medicines, herbs, non-prescription drugs, or dietary supplements you use. Also tell them if you smoke, drink alcohol, or use illegal drugs. Some items may interact with your medicine. What should I watch for while using this medication? Visit your care team regularly. Tell your care team if your symptoms do not start to get better or if they get worse. You may need blood work done while you are taking this medication. You may need to follow a special diet. Talk to your care team. Foods that contain iron include: whole grains/cereals, dried fruits, beans, or peas, leafy green vegetables, and organ meats (liver, kidney). What side effects may I notice from receiving this medication? Side effects that you should report to your care team as soon as possible: Allergic reactions--skin rash, itching, hives, swelling   of the face, lips, tongue, or throat Low blood pressure--dizziness, feeling faint or lightheaded, blurry vision Shortness of breath Side effects that usually do not require medical attention (report to your care team if they continue or are bothersome): Flushing Headache Joint pain Muscle pain Nausea Pain, redness, or irritation at injection site This list may not describe all possible side  effects. Call your doctor for medical advice about side effects. You may report side effects to FDA at 1-800-FDA-1088. Where should I keep my medication? This medication is given in a hospital or clinic and will not be stored at home. NOTE: This sheet is a summary. It may not cover all possible information. If you have questions about this medicine, talk to your doctor, pharmacist, or health care provider.  2024 Elsevier/Gold Standard (2021-10-10 00:00:00)       To help prevent nausea and vomiting after your treatment, we encourage you to take your nausea medication as directed.  BELOW ARE SYMPTOMS THAT SHOULD BE REPORTED IMMEDIATELY: *FEVER GREATER THAN 100.4 F (38 C) OR HIGHER *CHILLS OR SWEATING *NAUSEA AND VOMITING THAT IS NOT CONTROLLED WITH YOUR NAUSEA MEDICATION *UNUSUAL SHORTNESS OF BREATH *UNUSUAL BRUISING OR BLEEDING *URINARY PROBLEMS (pain or burning when urinating, or frequent urination) *BOWEL PROBLEMS (unusual diarrhea, constipation, pain near the anus) TENDERNESS IN MOUTH AND THROAT WITH OR WITHOUT PRESENCE OF ULCERS (sore throat, sores in mouth, or a toothache) UNUSUAL RASH, SWELLING OR PAIN  UNUSUAL VAGINAL DISCHARGE OR ITCHING   Items with * indicate a potential emergency and should be followed up as soon as possible or go to the Emergency Department if any problems should occur.  Please show the CHEMOTHERAPY ALERT CARD or IMMUNOTHERAPY ALERT CARD at check-in to the Emergency Department and triage nurse.  Should you have questions after your visit or need to cancel or reschedule your appointment, please contact [facility] [phone]  and follow the prompts.  Office hours are 8:00 a.m. to 4:30 p.m. Monday - Friday. Please note that voicemails left after 4:00 p.m. may not be returned until the following business day.  We are closed weekends and major holidays. You have access to a nurse at all times for urgent questions. Please call the main number  to the clinic [phone] and follow the prompts.  For any non-urgent questions, you may also contact your provider using MyChart. We now offer e-Visits for anyone 18 and older to request care online for non-urgent symptoms. For details visit [email or web address].   Also download the MyChart app! Go to the app store, search "MyChart", open the app, select [facility], and log in with your MyChart username and password.   

## 2022-10-12 NOTE — Progress Notes (Signed)
Patient presents today for Feraheme infusion. Vital signs stable. No complaints voiced today. Patient has sciatic and leg/foot pain that is chronic which she rates a 8 out of 10. MAR reviewed and updated.   Feraheme given today per MD orders. Tolerated infusion without adverse affects. Vital signs stable. No complaints at this time. Discharged from clinic by wheel chair in stable condition. Alert and oriented x 3. F/U with [facility] as scheduled.

## 2022-10-24 ENCOUNTER — Ambulatory Visit: Payer: PPO | Attending: Nurse Practitioner | Admitting: Nurse Practitioner

## 2022-10-24 ENCOUNTER — Encounter: Payer: Self-pay | Admitting: Nurse Practitioner

## 2022-10-24 VITALS — BP 118/68 | HR 97 | Ht 61.0 in | Wt 164.0 lb

## 2022-10-24 DIAGNOSIS — Z79899 Other long term (current) drug therapy: Secondary | ICD-10-CM

## 2022-10-24 DIAGNOSIS — Z789 Other specified health status: Secondary | ICD-10-CM

## 2022-10-24 DIAGNOSIS — R0609 Other forms of dyspnea: Secondary | ICD-10-CM | POA: Diagnosis not present

## 2022-10-24 DIAGNOSIS — I25119 Atherosclerotic heart disease of native coronary artery with unspecified angina pectoris: Secondary | ICD-10-CM | POA: Diagnosis not present

## 2022-10-24 DIAGNOSIS — N183 Chronic kidney disease, stage 3 unspecified: Secondary | ICD-10-CM

## 2022-10-24 DIAGNOSIS — D509 Iron deficiency anemia, unspecified: Secondary | ICD-10-CM

## 2022-10-24 DIAGNOSIS — I480 Paroxysmal atrial fibrillation: Secondary | ICD-10-CM | POA: Diagnosis not present

## 2022-10-24 DIAGNOSIS — E785 Hyperlipidemia, unspecified: Secondary | ICD-10-CM | POA: Diagnosis not present

## 2022-10-24 DIAGNOSIS — I5032 Chronic diastolic (congestive) heart failure: Secondary | ICD-10-CM | POA: Diagnosis not present

## 2022-10-24 DIAGNOSIS — R079 Chest pain, unspecified: Secondary | ICD-10-CM | POA: Diagnosis not present

## 2022-10-24 DIAGNOSIS — I4891 Unspecified atrial fibrillation: Secondary | ICD-10-CM

## 2022-10-24 DIAGNOSIS — J449 Chronic obstructive pulmonary disease, unspecified: Secondary | ICD-10-CM

## 2022-10-24 MED ORDER — FUROSEMIDE 40 MG PO TABS: 40.0000 mg | ORAL_TABLET | Freq: Two times a day (BID) | ORAL | 3 refills | Status: DC

## 2022-10-24 MED ORDER — DILTIAZEM HCL ER COATED BEADS 180 MG PO CP24: 180.0000 mg | ORAL_CAPSULE | Freq: Every day | ORAL | 3 refills | Status: AC

## 2022-10-24 MED ORDER — POTASSIUM CHLORIDE ER 10 MEQ PO CPCR: 10.0000 meq | ORAL_CAPSULE | Freq: Two times a day (BID) | ORAL | 3 refills | Status: DC

## 2022-10-24 MED ORDER — ISOSORBIDE MONONITRATE ER 30 MG PO TB24: 30.0000 mg | ORAL_TABLET | Freq: Every day | ORAL | 3 refills | Status: AC

## 2022-10-24 NOTE — Patient Instructions (Addendum)
Medication Instructions:  Your physician has recommended you make the following change in your medication:  Stop taking Zetia Start taking Diltiazem 180 Mg in the AM and continue 120 Mg in the PM Start taking furosemide 40 Mg BID Reduce IMDUR to 30 Mg daily  Start taking potassium BID Continue all other medications as prescribed.  Labwork: BMET,MAG,BNP in 1 week  Testing/Procedures: none  Follow-Up: Your physician recommends that you schedule a follow-up appointment in: 1 Month with Philis Nettle  Any Other Special Instructions Will Be Listed Below (If Applicable).  Referral sent to Pulmonology in [facility] or Dr.SOOD  Referral sent to Lipid Clinic in [place]   If you need a refill on your cardiac medications before your next appointment, please call your pharmacy.

## 2022-10-24 NOTE — Progress Notes (Unsigned)
Cardiology Office Note:  .   Date:  10/24/2022 ID:  [name], DOB May 29, 1932, MRN [id number] PCP: Sheela Stack  [facility] Providers Cardiologist:  Dina Rich, MD Electrophysiologist:  Lewayne Bunting, MD    History of Present Illness: .   [name] is a 89 y.o. female with a PMH of chronic diastolic CHF, PAF, CAD, hx of NSVT, IDA, hx of syncope, CKD stage 3, COPD, SHOB, who presents today for [facility] follow-up.   Last saw her on September 20, 2022. Noted progressive shortness of breath with exertion since last OV. Was not able to walk as far as she could without getting short of breath. Admitted to intermittent chest pains with this along with her shortness of breath.   Today she presents for follow-up with her daughter. Continues to admit to similar, stable intermittent chest pains from last OV and stable shortness of breath. Admits to palpitations and occasional sensation of tachycardia. Says ever since starting Zetia, she has developed a rash on her chest, also has itching. Denies any syncope, presyncope, dizziness, orthopnea, PND, swelling or significant weight changes, acute bleeding, or claudication.  Studies Reviewed: [name]    EKG Interpretation Date/Time:  Tuesday October 24 2022 15:05:27 EDT Ventricular Rate:  97 PR Interval:    QRS Duration:  70 QT Interval:  342 QTC Calculation: 434 R Axis:   -14  Text Interpretation: Atrial fibrillation Low voltage QRS Cannot rule out Anterior infarct (cited on or before 24-Oct-2022) When compared with ECG of 02-Jun-2022 11:13, Nonspecific T wave abnormality, improved in Anterolateral leads Confirmed by Sharlene Dory [phone]) on 10/24/2022 3:13:22 PM    Cardiac monitor 09/2022:    4 day monitor   100% afib burden rates 57 to 162, avg 90 bpm.   Rare ventricular ectopy in the form of isolated PVCs, couplets   Reported symptoms correlated with afib normal rates, though some limitations on time accuracy     Patch Wear  Time:  4 days and 4 hours (2024-04-24T14:29:15-0400 to 2024-04-28T18:35:23-0400)   Atrial Fibrillation occurred continuously (100% burden), ranging from 57-162 bpm (avg of 90 bpm). Isolated VEs were rare (<1.0%), VE Couplets were rare (<1.0%), and no VE Triplets were present.    Echo 07/2022:  1. Left ventricular ejection fraction, by estimation, is 60 to 65%. The  left ventricle has normal function. The left ventricle has no regional  wall motion abnormalities. Left ventricular diastolic function could not  be evaluated.   2. Right ventricular systolic function is normal. The right ventricular  size is normal. Tricuspid regurgitation signal is inadequate for assessing  PA pressure.   3. Left atrial size was mild to moderately dilated.   4. The mitral valve is abnormal. No evidence of mitral valve  regurgitation. No evidence of mitral stenosis. Moderate mitral annular  calcification.   5. The aortic valve is calcified. There is moderate calcification of the  aortic valve. Aortic valve regurgitation is mild. No aortic stenosis is  present.   6. The inferior vena cava is normal in size with greater than 50%  respiratory variability, suggesting right atrial pressure of 3 mmHg.   Comparison(s): No significant change from prior study.   Myoview 06/2020:  There was no ST segment deviation noted during stress. The study is normal. There are no perfusion defects consistent with prior infarct or current ischemia. This is a low risk study. The left ventricular ejection fraction is hyperdynamic (>65%).   LHC 2017: Ost RCA lesion,  80 %stenosed. Ost Cx to Prox Cx lesion, 0 %stenosed. Ost LAD to Prox LAD lesion, 20 %stenosed. 1st Diag lesion, 20 %stenosed. 4th Mrg lesion, 20 %stenosed. The left ventricular systolic function is normal. LV end diastolic pressure is mildly elevated. The left ventricular ejection fraction is 60-65% by visual estimate.   Normal LV function with an ejection  fraction of 60-65%.  There is evidence for left ventricular hypertrophy.   Coronary obstructive disease with evidence for smooth eccentric plaque in the proximal LAD of 20%, 20% stenosis in the inferior branch of the first diagonal vessel; a widely patent previously placed left circumflex stent extending from the ostium to the first marginal takeoff in a large dominant circumflex vessel with 20% distal narrowing; and a small nondominant RCA with 80% ostial stenosis.   RECOMMENDATION: Medical therapy.  Of note, the patient does have significant change in respiratory intrathoracic pressure from inspiratory and expiratory cycles.   Risk Assessment/Calculations:    CHA2DS2-VASc Score = 6  This indicates a 9.7% annual risk of stroke. The patient's score is based upon: CHF History: 1 HTN History: 1 Diabetes History: 0 Stroke History: 0 Vascular Disease History: 1 Age Score: 2 Gender Score: 1       Physical Exam:   VS:  BP 118/68   Pulse 97   Ht 5\' 1"  (1.549 m)   Wt 164 lb (74.4 kg)   SpO2 99%   BMI 30.99 kg/m    Wt Readings from Last 3 Encounters:  10/24/22 164 lb (74.4 kg)  09/20/22 163 lb 9.6 oz (74.2 kg)  08/09/22 162 lb (73.5 kg)    GEN: Well nourished, well developed in no acute distress NECK: No JVD; No carotid bruits CARDIAC: S1/S2, RRR, no murmurs, rubs, gallops RESPIRATORY:  Clear to auscultation without rales, wheezing or rhonchi  ABDOMEN: Soft, non-tender, non-distended EXTREMITIES:  No edema; No deformity   ASSESSMENT AND PLAN: .    Chronic diastolic CHF, medication management Stage C, NYHA class II-III symptoms. Echo 07/2022 revealed EF 60-65%. Recent BNP 2,186. Increase Lasix to 40 mg BID and double potassium supplement. Will obtain BMET, Mag, and BNP in 1 week. Continue rest of medication regimen. Low sodium diet, fluid restriction <2L, and daily weights encouraged. Educated to contact our office for weight gain of 2 lbs overnight or 5 lbs in one week.    CAD,  chest pain of uncertain etiology, DOE Admits to transient episodes of atypical chest pain, appears to be pulmonary/MSK/Neuro related. Not in acute distress on exam. Reviewed NST in 2022 was normal. No improvement after increasing Imdur at last OV. Will decrease Imdur to 30 mg daily in order to increase Diltiazem below - see #4. Not on ASA d/t being on Eliquis. Will d/c Zetia d/t allergy. Not able to tolerate statins. Heart healthy diet encouraged. ED precautions discussed.    3. HLD, statin intolerance Recent LDL 132. Not on any lipid lowering medications, unable to tolerate Zetia/statins. Will refer to Lipid Clinic. Heart healthy diet encouraged.    4. PAF Does admit to occasional palpitations, sense of tachycardia. HR appears to trend in upper 90's/low 100's. Will increase AM Diltiazem to 180 mg, continue PM Diltiazem at 120 mg. Continue metoprolol, and Eliquis, on appropriate dosage and denies any bleeding issues. Heart healthy diet encouraged.    5. COPD, DOE Admits to stable DOE, walking shorter distances. Will refer to pulmonology as patient and daughter request to see a new physician. Low suspicion for PE as she is  on Eliquis. Continue current medication regimen. Heart healthy diet encouraged. ED precautions discussed.    6. CKD stage 3 Most recent sCr 1.56 with eGFR 32. Avoid nephrotoxic agents. Obtaining labs as mentioned above. Continue to follow with PCP.   7. IDA Continue to follow-up with Hem/Onc.    Dispo: Follow-up with me or APP in 1 month or sooner if anything changes.   Signed, Sharlene Dory, NP

## 2022-10-25 ENCOUNTER — Telehealth: Payer: Self-pay | Admitting: Internal Medicine

## 2022-10-25 ENCOUNTER — Institutional Professional Consult (permissible substitution): Payer: PPO | Admitting: Internal Medicine

## 2022-10-25 NOTE — Telephone Encounter (Signed)
Patient requesting to switch from Dr. Sherene Sires to Dr. Vassie Loll. Please advise if switch is okay.

## 2022-10-26 NOTE — Telephone Encounter (Signed)
Patient is scheduled with Dr. Vassie Loll in [place]- nothing further needed.

## 2022-11-02 ENCOUNTER — Other Ambulatory Visit: Payer: Self-pay

## 2022-11-02 ENCOUNTER — Other Ambulatory Visit: Payer: Self-pay | Admitting: Nurse Practitioner

## 2022-11-02 DIAGNOSIS — Z79899 Other long term (current) drug therapy: Secondary | ICD-10-CM

## 2022-11-03 DIAGNOSIS — Z79899 Other long term (current) drug therapy: Secondary | ICD-10-CM | POA: Diagnosis not present

## 2022-11-03 DIAGNOSIS — R0600 Dyspnea, unspecified: Secondary | ICD-10-CM | POA: Diagnosis not present

## 2022-11-24 ENCOUNTER — Ambulatory Visit: Payer: PPO | Attending: Nurse Practitioner | Admitting: Nurse Practitioner

## 2022-11-24 ENCOUNTER — Encounter: Payer: Self-pay | Admitting: Nurse Practitioner

## 2022-11-24 VITALS — BP 124/60 | HR 81 | Ht 61.0 in | Wt 163.0 lb

## 2022-11-24 DIAGNOSIS — E785 Hyperlipidemia, unspecified: Secondary | ICD-10-CM | POA: Diagnosis not present

## 2022-11-24 DIAGNOSIS — R079 Chest pain, unspecified: Secondary | ICD-10-CM

## 2022-11-24 DIAGNOSIS — Z79899 Other long term (current) drug therapy: Secondary | ICD-10-CM | POA: Diagnosis not present

## 2022-11-24 DIAGNOSIS — J449 Chronic obstructive pulmonary disease, unspecified: Secondary | ICD-10-CM | POA: Diagnosis not present

## 2022-11-24 DIAGNOSIS — I25119 Atherosclerotic heart disease of native coronary artery with unspecified angina pectoris: Secondary | ICD-10-CM | POA: Diagnosis not present

## 2022-11-24 DIAGNOSIS — I480 Paroxysmal atrial fibrillation: Secondary | ICD-10-CM | POA: Diagnosis not present

## 2022-11-24 DIAGNOSIS — N183 Chronic kidney disease, stage 3 unspecified: Secondary | ICD-10-CM

## 2022-11-24 DIAGNOSIS — Z789 Other specified health status: Secondary | ICD-10-CM

## 2022-11-24 DIAGNOSIS — D509 Iron deficiency anemia, unspecified: Secondary | ICD-10-CM | POA: Diagnosis not present

## 2022-11-24 DIAGNOSIS — R0609 Other forms of dyspnea: Secondary | ICD-10-CM | POA: Diagnosis not present

## 2022-11-24 DIAGNOSIS — R42 Dizziness and giddiness: Secondary | ICD-10-CM

## 2022-11-24 DIAGNOSIS — I5032 Chronic diastolic (congestive) heart failure: Secondary | ICD-10-CM | POA: Diagnosis not present

## 2022-11-24 MED ORDER — FUROSEMIDE 40 MG PO TABS: 60.0000 mg | ORAL_TABLET | Freq: Every day | ORAL | 3 refills | Status: DC

## 2022-11-24 NOTE — Patient Instructions (Signed)
Medication Instructions:  Your physician has recommended you make the following change in your medication:   -Increase Lasix to 60 mg once daily.   *If you need a refill on your cardiac medications before your next appointment, please call your pharmacy*   Lab Work: In 1 week:  -BNP -Mag -BMET  If you have labs (blood work) drawn today and your tests are completely normal, you will receive your results only by: MyChart Message (if you have MyChart) OR A paper copy in the mail If you have any lab test that is abnormal or we need to change your treatment, we will call you to review the results.   Testing/Procedures: None   Follow-Up: At [facility], you and your health needs are our priority.  As part of our continuing mission to provide you with exceptional heart care, we have created designated Provider Care Teams.  These Care Teams include your primary Cardiologist (physician) and Advanced Practice Providers (APPs -  Physician Assistants and Nurse Practitioners) who all work together to provide you with the care you need, when you need it.  We recommend signing up for the patient portal called "MyChart".  Sign up information is provided on this After Visit Summary.  MyChart is used to connect with patients for Virtual Visits (Telemedicine).  Patients are able to view lab/test results, encounter notes, upcoming appointments, etc.  Non-urgent messages can be sent to your provider as well.   To learn more about what you can do with MyChart, go to [email or web address].    Your next appointment:   8 week(s)  Provider:   Sharlene Dory, NP    Other Instructions

## 2022-11-24 NOTE — Progress Notes (Signed)
Cardiology Office Note:  .   Date: 11/24/2022 ID:  [name], DOB 01-12-1933, MRN [id number] PCP: [name]  [name] Providers Cardiologist:  Dina Rich, MD Electrophysiologist:  Lewayne Bunting, MD    History of Present Illness: .   [name] is a 89 y.o. female with a PMH of chronic diastolic CHF, PAF, CAD, hx of NSVT, IDA, hx of syncope, CKD stage 3, COPD, SHOB, who presents today for scheduled follow-up.   Today she presents for follow-up with her daughter. Continues to admit to similar, stable intermittent chest pains from last OV and stable shortness of breath. Admits to palpitations and occasional sensation of tachycardia with exertion. Denies any syncope, presyncope, orthopnea, PND, swelling or significant weight changes, acute bleeding, or claudication. Also admits to feeling dizzy/lightheaded after taking medications in the AM, has to lay down for 1 hour, and says feeling will subside.   Studies Reviewed: .    Cardiac monitor 09/2022:    4 day monitor   100% afib burden rates 57 to 162, avg 90 bpm.   Rare ventricular ectopy in the form of isolated PVCs, couplets   Reported symptoms correlated with afib normal rates, though some limitations on time accuracy     Patch Wear Time:  4 days and 4 hours (2024-04-24T14:29:15-0400 to 2024-04-28T18:35:23-0400)   Atrial Fibrillation occurred continuously (100% burden), ranging from 57-162 bpm (avg of 90 bpm). Isolated VEs were rare (<1.0%), VE Couplets were rare (<1.0%), and no VE Triplets were present.    Echo 07/2022:  1. Left ventricular ejection fraction, by estimation, is 60 to 65%. The  left ventricle has normal function. The left ventricle has no regional  wall motion abnormalities. Left ventricular diastolic function could not  be evaluated.   2. Right ventricular systolic function is normal. The right ventricular  size is normal. Tricuspid regurgitation signal is inadequate for assessing   PA pressure.   3. Left atrial size was mild to moderately dilated.   4. The mitral valve is abnormal. No evidence of mitral valve  regurgitation. No evidence of mitral stenosis. Moderate mitral annular  calcification.   5. The aortic valve is calcified. There is moderate calcification of the  aortic valve. Aortic valve regurgitation is mild. No aortic stenosis is  present.   6. The inferior vena cava is normal in size with greater than 50%  respiratory variability, suggesting right atrial pressure of 3 mmHg.   Comparison(s): No significant change from prior study.   Myoview 06/2020:  There was no ST segment deviation noted during stress. The study is normal. There are no perfusion defects consistent with prior infarct or current ischemia. This is a low risk study. The left ventricular ejection fraction is hyperdynamic (>65%).   LHC 2017: Ost RCA lesion, 80 %stenosed. Ost Cx to Prox Cx lesion, 0 %stenosed. Ost LAD to Prox LAD lesion, 20 %stenosed. 1st Diag lesion, 20 %stenosed. 4th Mrg lesion, 20 %stenosed. The left ventricular systolic function is normal. LV end diastolic pressure is mildly elevated. The left ventricular ejection fraction is 60-65% by visual estimate.   Normal LV function with an ejection fraction of 60-65%.  There is evidence for left ventricular hypertrophy.   Coronary obstructive disease with evidence for smooth eccentric plaque in the proximal LAD of 20%, 20% stenosis in the inferior branch of the first diagonal vessel; a widely patent previously placed left circumflex stent extending from the ostium to the first marginal takeoff in a large  dominant circumflex vessel with 20% distal narrowing; and a small nondominant RCA with 80% ostial stenosis.   RECOMMENDATION: Medical therapy.  Of note, the patient does have significant change in respiratory intrathoracic pressure from inspiratory and expiratory cycles.   Risk Assessment/Calculations:    CHA2DS2-VASc  Score = 6  This indicates a 9.7% annual risk of stroke. The patient's score is based upon: CHF History: 1 HTN History: 1 Diabetes History: 0 Stroke History: 0 Vascular Disease History: 1 Age Score: 2 Gender Score: 1       Physical Exam:   VS:  BP 124/60 (BP Location: Right Arm, Patient Position: Sitting, Cuff Size: Normal)   Pulse 81   Ht 5\' 1"  (1.549 m)   Wt 163 lb (73.9 kg)   SpO2 97%   BMI 30.80 kg/m    Wt Readings from Last 3 Encounters:  11/24/22 163 lb (73.9 kg)  10/24/22 164 lb (74.4 kg)  09/20/22 163 lb 9.6 oz (74.2 kg)    GEN: Well nourished, well developed in no acute distress NECK: No JVD; No carotid bruits CARDIAC: S1/S2, RRR, no murmurs, rubs, gallops RESPIRATORY:  Clear to auscultation without rales, wheezing or rhonchi  EXTREMITIES:  No edema; No deformity   ASSESSMENT AND PLAN: .    Chronic diastolic CHF, medication management Stage C, NYHA class II-III symptoms. Echo 07/2022 revealed EF 60-65%. Recent BNP 1,845. Will increase Lasix to 60 mg daily and continue potassium supplement. Will obtain BMET, Mag, and BNP in 1 week. Continue rest of medication regimen. Low sodium diet, fluid restriction <2L, and daily weights encouraged. Educated to contact our office for weight gain of 2 lbs overnight or 5 lbs in one week.    CAD, chest pain of uncertain etiology, DOE Admits to transient episodes of atypical chest pain, appears to be pulmonary/MSK/Neuro related. Not in acute distress on exam. Reviewed NST in 2022 was normal. Will continue to monitor at this time. Not on ASA d/t being on Eliquis. Not able to tolerate statins. Continue current medication regimen. Heart healthy diet encouraged. ED precautions discussed. Follow-up with Pulmonology as scheduled.    3. HLD, statin intolerance Recent LDL 132. Not on any lipid lowering medications, unable to tolerate Zetia/statins. Previously referred to Lipid Clinic. Heart healthy diet encouraged.    4. PAF Does admit to  occasional palpitations, sense of tachycardia with exertion. HR well controlled today. Continue diltiazem, metoprolol, and Eliquis, on appropriate dosage and denies any bleeding issues. Heart healthy diet encouraged.    5. COPD, DOE Admits to stable DOE, walking shorter distances.  Low suspicion for PE as she is on Eliquis. Continue current medication regimen. Heart healthy diet encouraged. ED precautions discussed. Follow-up with Dr. Vassie Loll as scheduled.    6. CKD stage 3 Most recent sCr 1.39 with eGFR 37. Avoid nephrotoxic agents. Obtaining labs as mentioned above. Continue to follow with PCP.   7. IDA Continue to follow-up with Hem/Onc.   8. Dizziness Etiology multifactorial. Given BP log and discussed to monitor BP at home before medications and at least 2 hours after medications and sitting for 5-10 minutes. Care precautions discussed. Encouraged adequate hydration. Discussed to contact office if SBP < 100 and symptoms do not improve or worsen. Verbalized understanding.     Dispo: Follow-up with me or APP in 8 weeks or sooner if anything changes.   Signed, Sharlene Dory, NP

## 2022-12-01 DIAGNOSIS — I509 Heart failure, unspecified: Secondary | ICD-10-CM | POA: Diagnosis not present

## 2022-12-01 DIAGNOSIS — Z79899 Other long term (current) drug therapy: Secondary | ICD-10-CM | POA: Diagnosis not present

## 2022-12-05 NOTE — Addendum Note (Signed)
Addended by: Sharen Hones on: 12/05/2022 10:13 AM   Modules accepted: Orders

## 2022-12-06 ENCOUNTER — Encounter (HOSPITAL_BASED_OUTPATIENT_CLINIC_OR_DEPARTMENT_OTHER): Payer: Self-pay | Admitting: Pulmonary Disease

## 2022-12-06 ENCOUNTER — Ambulatory Visit (INDEPENDENT_AMBULATORY_CARE_PROVIDER_SITE_OTHER): Payer: PPO | Admitting: Pulmonary Disease

## 2022-12-06 VITALS — BP 118/72 | HR 83 | Resp 16 | Ht 61.0 in | Wt 162.9 lb

## 2022-12-06 DIAGNOSIS — J849 Interstitial pulmonary disease, unspecified: Secondary | ICD-10-CM

## 2022-12-06 NOTE — Assessment & Plan Note (Signed)
The presence of dry crackles on exam seems to suggest pulmonary fibrosis.  This is also suggested on CT abdomen from 09/2020 although interestingly interstitial infiltrates are not prominent on her chest x-ray.  On ambulation today, she was objectively very short of breath after taking a few steps although oxygen saturation stayed at 94%.  Echo has not shown significant pulmonary hypertension  We will proceed with high-resolution CT chest to clarify.  Will also obtain basic serology pending on age group if there is fibrosis that it is more likely to be IPF than CT ILD.  Unfortunately she does not qualify for oxygen.  We may be limited in our therapeutic interventions.  I am not sure she would be a great candidate for antifibrotic's but we will have that discussion once the diagnosis is confirmed.  If possible we will have her obtain PFTs.  She has been maintained on Trelegy without significant benefit-previous spirometry from 2015 did not show any evidence of airway obstruction

## 2022-12-06 NOTE — Patient Instructions (Addendum)
  X Amb sat  X hi Res CT chest to check for fibrosis in your lungs  X Blood work -CCP, [id number], ACE level

## 2022-12-06 NOTE — Progress Notes (Signed)
Subjective:    Patient ID: [name], female    DOB: 02/05/1933, 89 y.o.   MRN: [id number]  HPI 89 year old remote smoker referred by cardiology for evaluation of dyspnea for 4 years  PMH  chronic diastolic CHF,  PAF, CAD, hx of NSVT, IDA, hx of syncope,  CKD stage 3   She arrives in a wheelchair accompanied by her daughter who corroborates history.  She reports dyspnea on minimal activity, worse with activity and improving on sitting still.  She ambulates with a walker.  This has been progressive over the last 4 years and now occurs on taking even a few steps.  Her chest also starts hurting.  She reports 3 pillow orthopnea sleeps in a hospital bed inclined, she also reports mild pedal edema She had an extensive evaluation by cardiology.  I reviewed the last consultation from 8/9-she has been given 60 mg of Lasix to take daily for 5 days and then repeat BMET.  Atrial fibrillation is chronic and is rate controlled on Cardizem and metoprolol, she is also on Eliquis.  Echo 07/2022 has shown normal LV function and normal RV function and size. Cardiac monitor 09/2022 showed 100% atrial fibrillation burden Myoview 06/2020 was a low risk study. LHC 2017 showed single-vessel disease, RCA 80%  She smoked about 65 pack years before she quit in 2003.  Chest x-ray 09/27/2022 does not show significant infiltrates or effusions  Significant tests/ events reviewed  02/12/2014 spirometry > FEV1 1.41 (83%) ratio 83  Chest CT cuts on abd ct 09/29/20  Mild interstitial fibrosis and mild bronchiectasis in the lung bases.    Past Medical History:  Diagnosis Date   Anxiety    Arthritis    Atrial fibrillation (HCC)    Bursitis    Left shoulder   Cataract    CHF (congestive heart failure) (HCC)    CKD (chronic kidney disease)    stage 3-4   COPD (chronic obstructive pulmonary disease) (HCC)    Coronary atherosclerosis of native coronary artery    a. s/p DES to LCx in 04/2013 b. cath in 11/2015  showing patent stent with 20% prox-LAD and 80% ostial RCA stenosis for which medical management was recommended due to small artery size   Depression    Diastolic heart failure (HCC)    EF 55-60%   Dysphagia, unspecified(787.20)    Dyspnea    Dysrhythmia    Essential hypertension    GERD (gastroesophageal reflux disease)    Hx Schatzki's ring, multiple EGD/ED last 01/06/2004   Gout    Headache    History of anemia    Hyperlipidemia    Internal hemorrhoids without mention of complication    MI (myocardial infarction) (HCC) 2006   Microscopic colitis 2003   Panic disorder without agoraphobia    Paresthesia    Pneumonia 12/2011   PVD (peripheral vascular disease) (HCC)    S/P colonoscopy 09/27/2001   internal hemorrhoids, desc colon inflam polyp, SB BX-chronic duodenitis, colitis   Sleep apnea    Thyroid disease     Past Surgical History:  Procedure Laterality Date   ABDOMINAL HYSTERECTOMY     ABDOMINAL HYSTERECTOMY     AGILE CAPSULE N/A 08/30/2020   Procedure: AGILE CAPSULE;  Surgeon: Corbin Ade, MD;  Location: [facility];  Service: Endoscopy;  Laterality: N/A;  7:30am   ANTERIOR AND POSTERIOR REPAIR     with resection of vagina   ANTERIOR LAT LUMBAR FUSION N/A 08/01/2016   Procedure:  Lumbar Two-Lumbar Five Transpsoas lateral interbody fusion with Lumbar Two-Three lateral plate fixation;  Surgeon: Loura Halt Ditty, MD;  Location: [facility] OR;  Service: Neurosurgery;  Laterality: N/A;  L2-5 Transpsoas lateral interbody fusion with L2-3 lateral plate fixation   APPENDECTOMY     BACK SURGERY     BIOPSY  07/05/2015   Procedure: BIOPSY;  Surgeon: Corbin Ade, MD;  Location: [facility];  Service: Endoscopy;;  gastric polyp biopsy, ascending colon biopsy   BIOPSY  02/16/2020   Procedure: BIOPSY;  Surgeon: Corbin Ade, MD;  [facility]: [facility];  Service: Endoscopy;;   BIOPSY  06/28/2020   Procedure: BIOPSY;  Surgeon: Corbin Ade, MD;  [facility]: [facility];   Service: Endoscopy;;   BLADDER SUSPENSION  11/09/2011   Procedure: TRANSVAGINAL TAPE (TVT) PROCEDURE;  Surgeon: Ky Barban, MD;  Location: [facility];  Service: Urology;  Laterality: N/A;   bladder tack  06/2010   BREAST LUMPECTOMY  1998   left, benign   CARDIAC CATHETERIZATION     CARDIAC CATHETERIZATION     CARDIAC CATHETERIZATION N/A 12/16/2015   Procedure: Left Heart Cath and Coronary Angiography;  Surgeon: Lennette Bihari, MD;  Location: [facility];  Service: Cardiovascular;  Laterality: N/A;   CARDIOVERSION N/A 10/04/2017   Procedure: CARDIOVERSION;  Surgeon: Laqueta Linden, MD;  Location: [facility];  Service: Cardiovascular;  Laterality: N/A;   CARDIOVERSION N/A 01/30/2018   Procedure: CARDIOVERSION;  Surgeon: Laqueta Linden, MD;  Location: [facility];  Service: Cardiovascular;  Laterality: N/A;   CARDIOVERSION N/A 11/10/2019   Procedure: CARDIOVERSION;  Surgeon: Wendall Stade, MD;  Location: [facility] ENDOSCOPY;  Service: Cardiovascular;  Laterality: N/A;   CARPAL TUNNEL RELEASE  1989   left   cataract surgery     CHOLECYSTECTOMY  1998   Cholecystectomy     COLONOSCOPY  03/16/2011   multiple hyperplastic colon polyps, sigmoid diverticulosis, melanosis coli   COLONOSCOPY WITH PROPOFOL N/A 07/05/2015   RMR:one 5 mm polyp in descending colon   COLONOSCOPY WITH PROPOFOL N/A 06/28/2020   Procedure: COLONOSCOPY WITH PROPOFOL;  Surgeon: Corbin Ade, MD;  [facility]: [facility];  Service: Endoscopy;  Laterality: N/A;  am appt   CORONARY ANGIOGRAPHY N/A 05/16/2018   Procedure: CORONARY ANGIOGRAPHY (CATH LAB);  Surgeon: Lyn Records, MD;  Location: [facility] INVASIVE CV LAB;  Service: Cardiovascular;  Laterality: N/A;   CORONARY ANGIOPLASTY WITH STENT PLACEMENT  2015   ESOPHAGEAL DILATION N/A 07/05/2015   Procedure: ESOPHAGEAL DILATION;  Surgeon: Corbin Ade, MD;  [facility]: [facility];  Service: Endoscopy;  Laterality: N/A;   ESOPHAGOGASTRODUODENOSCOPY (EGD) WITH PROPOFOL  N/A 07/05/2015   [id number]   ESOPHAGOGASTRODUODENOSCOPY (EGD) WITH PROPOFOL N/A 02/16/2020   Procedure: ESOPHAGOGASTRODUODENOSCOPY (EGD) WITH PROPOFOL;  Surgeon: Corbin Ade, MD;  Normal examined esophagus s/p dilation, erythematous mucosa in the stomach s/p biopsy, normal examined duodenum. Pathology with mild gastropathy, negative for H. Pylori.    GIVENS CAPSULE STUDY N/A 11/10/2020   Procedure: GIVENS CAPSULE STUDY;  Surgeon: Corbin Ade, MD;  [facility]: [facility];  Service: Endoscopy;  Laterality: N/A;  7:30am   JOINT REPLACEMENT Right 2007   right knee   left hand surgery     LEFT HEART CATHETERIZATION WITH CORONARY ANGIOGRAM N/A 05/14/2013   Procedure: LEFT HEART CATHETERIZATION WITH CORONARY ANGIOGRAM;  Surgeon: Micheline Chapman, MD;  Location[facility] CATH LAB;  Service: Cardiovascular;  Laterality: N/A;   left rotator cuff  surgery     LUMBAR LAMINECTOMY/DECOMPRESSION MICRODISCECTOMY N/A 10/11/2012   Procedure: LUMBAR LAMINECTOMY/DECOMPRESSION MICRODISCECTOMY 2 LEVELS;  Surgeon: Karn Cassis, MD;  Location: [facility];  Service: Neurosurgery;  Laterality: N/A;  L3-4 L4-5 Laminectomy   LUMBAR WOUND DEBRIDEMENT N/A 09/27/2015   Procedure: Exploration of Lumbar Wound w/ Repair CSF Leak/Lumbar Drain Placement;  Surgeon: Hilda Lias, MD;  Location: [facility] NEURO ORS;  Service: Neurosurgery;  Laterality: N/A;   MALONEY DILATION  03/16/2011   Gastritis. No H.pylori on bx. 30F maloney dilation with disruption of  occult cevical esophageal web   MALONEY DILATION N/A 02/16/2020   Procedure: MALONEY DILATION;  Surgeon: Corbin Ade, MD;  [facility]: [facility];  Service: Endoscopy;  Laterality: N/A;   NASAL SINUS SURGERY     right knee replacement  2007   right leg benign tumor     SHOULDER SURGERY Left    TEE WITHOUT CARDIOVERSION N/A 10/04/2017   Procedure: TRANSESOPHAGEAL ECHOCARDIOGRAM (TEE) WITH PROPOFOL;  Surgeon: Laqueta Linden, MD;  Location: [facility];  Service:  Cardiovascular;  Laterality: N/A;   TEE WITHOUT CARDIOVERSION N/A 11/10/2019   Procedure: TRANSESOPHAGEAL ECHOCARDIOGRAM (TEE);  Surgeon: Wendall Stade, MD;  Location: [facility] ENDOSCOPY;  Service: Cardiovascular;  Laterality: N/A;   TONSILLECTOMY     unspecified area, hysterectomy  1972   partial    Allergies  Allergen Reactions   Cephalosporins Diarrhea and Nausea Only    Lightheaded   Levaquin [Levofloxacin In D5w] Swelling   Macrodantin [Nitrofurantoin Macrocrystal] Swelling   Phenothiazines Anaphylaxis and Hives   Polysorbate Anaphylaxis   Prednisone Shortness Of Breath   Buspirone Itching   Cardura [Doxazosin Mesylate] Itching   Codeine Itching   Acyclovir And Related Itching    Redness of skin   Colcrys [Colchicine] Nausea Only    Upset stomach    Prochlorperazine Other (See Comments)    "Upset stomach"   Ranexa [Ranolazine]     Severe drop in BP   Atorvastatin Hives    Cramping; tolerates Crestor ok   Colestipol Palpitations   Ofloxacin Rash   Other Itching and Rash    "WOOL"= make skin look like it has been burned   Penicillins Other (See Comments)    Causes redness all over. Has patient had a PCN reaction causing immediate rash, facial/tongue/throat swelling, SOB or lightheadedness with hypotension: No Has patient had a PCN reaction causing severe rash involving mucus membranes or skin necrosis: No Has patient had a PCN reaction that required hospitalization No Has patient had a PCN reaction occurring within the last 10 years: No If all of the above answers are "NO", then may proceed with Cephalosporin use.    Pimozide Hives and Itching   Zetia [Ezetimibe] Itching and Rash    Social History   Socioeconomic History   Marital status: Divorced    Spouse name: Not on file   Number of children: 5   Years of education: Not on file   Highest education level: Not on file  Occupational History   Occupation: retired  Tobacco Use   Smoking status: Former    Current  packs/day: 0.00    Average packs/day: 1 pack/day for 64.0 years (64.0 ttl pk-yrs)    Types: Cigarettes    Start date: 12/24/1947    Quit date: 11/17/2001    Years since quitting: 21.0   Smokeless tobacco: Never   Tobacco comments:    Quit smoking in 2003  Vaping Use   Vaping status: Never  Used  Substance and Sexual Activity   Alcohol use: Not Currently   Drug use: No   Sexual activity: Never  Other Topics Concern   Not on file  Social History Narrative   Divorced.   Sister had colon perforation & died from complications in [place], [place]   Social Determinants of Health   Financial Resource Strain: Not on file  Food Insecurity: Not on file  Transportation Needs: Not on file  Physical Activity: Not on file  Stress: Not on file  Social Connections: Not on file  Intimate Partner Violence: Not on file    Family History  Problem Relation Age of Onset   Stroke Mother    Parkinson's disease Father    Coronary artery disease Other        family Hx-sons   Cancer Other    Stroke Other        family Hx   Hypertension Other        family Hx   Diabetes Brother    Heart disease Son        before age 58   Diabetes Son    Stroke Daughter 70   Colon cancer Grandson        diagnosed 40   Inflammatory bowel disease Neg Hx      Review of Systems Constitutional: negative for anorexia, fevers and sweats  Eyes: negative for irritation, redness and visual disturbance  Ears, nose, mouth, throat, and face: negative for earaches, epistaxis, nasal congestion and sore throat  Respiratory: negative for  sputum and wheezing  Cardiovascular: negative for  palpitations and syncope  Gastrointestinal: negative for abdominal pain, constipation, diarrhea, melena, nausea and vomiting  Genitourinary:negative for dysuria, frequency and hematuria  Hematologic/lymphatic: negative for bleeding, easy bruising and lymphadenopathy  Musculoskeletal:negative for arthralgias, muscle weakness and stiff joints   Neurological: negative for coordination problems, gait problems, headaches and weakness  Endocrine: negative for diabetic symptoms including polydipsia, polyuria and weight loss     Objective:   Physical Exam  Gen. Pleasant, obese, in no distress, normal affect ENT - no pallor,icterus, no post nasal drip, class 2-3 airway Neck: No JVD, no thyromegaly, no carotid bruits Lungs: no use of accessory muscles, no dullness to percussion, bibasal dry crackles Cardiovascular: Rhythm regular, heart sounds  normal, no murmurs or gallops, no peripheral edema Abdomen: soft and non-tender, no hepatosplenomegaly, BS normal. Musculoskeletal: No deformities, no cyanosis or clubbing Neuro:  alert, non focal, no tremors        Assessment & Plan:

## 2022-12-08 DIAGNOSIS — F419 Anxiety disorder, unspecified: Secondary | ICD-10-CM | POA: Diagnosis not present

## 2022-12-08 DIAGNOSIS — R32 Unspecified urinary incontinence: Secondary | ICD-10-CM | POA: Diagnosis not present

## 2022-12-08 DIAGNOSIS — Z6830 Body mass index (BMI) 30.0-30.9, adult: Secondary | ICD-10-CM | POA: Diagnosis not present

## 2022-12-08 DIAGNOSIS — R21 Rash and other nonspecific skin eruption: Secondary | ICD-10-CM | POA: Diagnosis not present

## 2022-12-15 ENCOUNTER — Other Ambulatory Visit (HOSPITAL_COMMUNITY)
Admission: RE | Admit: 2022-12-15 | Discharge: 2022-12-15 | Disposition: A | Discharge status: Home / Self Care | Payer: PPO | Source: Ambulatory Visit | Attending: Nurse Practitioner | Admitting: Nurse Practitioner

## 2022-12-15 ENCOUNTER — Other Ambulatory Visit (HOSPITAL_COMMUNITY)
Admission: RE | Admit: 2022-12-15 | Discharge: 2022-12-15 | Disposition: A | Discharge status: Home / Self Care | Payer: PPO | Source: Ambulatory Visit | Attending: Pulmonary Disease | Admitting: Pulmonary Disease

## 2022-12-15 DIAGNOSIS — I482 Chronic atrial fibrillation, unspecified: Secondary | ICD-10-CM | POA: Diagnosis not present

## 2022-12-15 DIAGNOSIS — J449 Chronic obstructive pulmonary disease, unspecified: Secondary | ICD-10-CM | POA: Diagnosis not present

## 2022-12-15 DIAGNOSIS — J849 Interstitial pulmonary disease, unspecified: Secondary | ICD-10-CM | POA: Insufficient documentation

## 2022-12-15 DIAGNOSIS — Z79899 Other long term (current) drug therapy: Secondary | ICD-10-CM | POA: Diagnosis not present

## 2022-12-15 DIAGNOSIS — I509 Heart failure, unspecified: Secondary | ICD-10-CM | POA: Diagnosis not present

## 2022-12-15 LAB — BRAIN NATRIURETIC PEPTIDE: B Natriuretic Peptide: 208 pg/mL — ABNORMAL HIGH (ref 0.0–100.0)

## 2022-12-15 LAB — BASIC METABOLIC PANEL
Anion gap: 8 (ref 5–15)
BUN: 26 mg/dL — ABNORMAL HIGH (ref 8–23)
CO2: 26 mmol/L (ref 22–32)
Calcium: 9.5 mg/dL (ref 8.9–10.3)
Chloride: 98 mmol/L (ref 98–111)
Creatinine, Ser: 1.76 mg/dL — ABNORMAL HIGH (ref 0.44–1.00)
GFR, Estimated: 27 mL/min — ABNORMAL LOW (ref >60)
Glucose, Bld: 149 mg/dL — ABNORMAL HIGH (ref 70–99)
Potassium: 3.7 mmol/L (ref 3.5–5.1)
Sodium: 132 mmol/L — ABNORMAL LOW (ref 135–145)

## 2022-12-15 LAB — MAGNESIUM: Magnesium: 2.2 mg/dL (ref 1.7–2.4)

## 2022-12-19 ENCOUNTER — Telehealth: Payer: Self-pay | Admitting: Nurse Practitioner

## 2022-12-19 DIAGNOSIS — Z79899 Other long term (current) drug therapy: Secondary | ICD-10-CM

## 2022-12-19 MED ORDER — FUROSEMIDE 40 MG PO TABS
40.0000 mg | ORAL_TABLET | Freq: Every day | ORAL | 3 refills | Status: AC
Start: 2022-12-19 — End: 2024-04-05

## 2022-12-19 NOTE — Telephone Encounter (Signed)
-----   Message from Ellsworth Lennox sent at 12/15/2022  4:09 PM EDT ----- Covering for Lanora Manis - Please let the patient know that her fluid level remains elevated but has improved. Kidney function actually suggests dehydration as creatinine has increased from 1.31 on 12/01/2022 to 1.76. I would recommend reducing Lasix from 60 mg daily to 40 mg daily.  Recheck BMET in 2 weeks for reassessment.

## 2022-12-20 DIAGNOSIS — G8929 Other chronic pain: Secondary | ICD-10-CM | POA: Diagnosis not present

## 2022-12-20 DIAGNOSIS — M4316 Spondylolisthesis, lumbar region: Secondary | ICD-10-CM | POA: Diagnosis not present

## 2022-12-20 DIAGNOSIS — M5416 Radiculopathy, lumbar region: Secondary | ICD-10-CM | POA: Diagnosis not present

## 2022-12-20 DIAGNOSIS — Z6829 Body mass index (BMI) 29.0-29.9, adult: Secondary | ICD-10-CM | POA: Diagnosis not present

## 2022-12-28 ENCOUNTER — Ambulatory Visit: Payer: PPO

## 2023-01-03 ENCOUNTER — Other Ambulatory Visit (HOSPITAL_COMMUNITY)
Admission: RE | Admit: 2023-01-03 | Discharge: 2023-01-03 | Disposition: A | Discharge status: Home / Self Care | Payer: PPO | Source: Ambulatory Visit | Attending: Nurse Practitioner | Admitting: Nurse Practitioner

## 2023-01-03 DIAGNOSIS — Z79899 Other long term (current) drug therapy: Secondary | ICD-10-CM | POA: Insufficient documentation

## 2023-01-03 LAB — BASIC METABOLIC PANEL WITH GFR
Anion gap: 11 (ref 5–15)
BUN: 16 mg/dL (ref 8–23)
CO2: 25 mmol/L (ref 22–32)
Calcium: 8.7 mg/dL — ABNORMAL LOW (ref 8.9–10.3)
Chloride: 99 mmol/L (ref 98–111)
Creatinine, Ser: 1.45 mg/dL — ABNORMAL HIGH (ref 0.44–1.00)
GFR, Estimated: 35 mL/min — ABNORMAL LOW (ref >60)
Glucose, Bld: 159 mg/dL — ABNORMAL HIGH (ref 70–99)
Potassium: 2.8 mmol/L — ABNORMAL LOW (ref 3.5–5.1)
Sodium: 135 mmol/L (ref 135–145)

## 2023-01-05 ENCOUNTER — Ambulatory Visit: Payer: PPO | Attending: Nurse Practitioner | Admitting: Nurse Practitioner

## 2023-01-05 ENCOUNTER — Encounter: Payer: Self-pay | Admitting: Nurse Practitioner

## 2023-01-05 VITALS — BP 122/72 | HR 78 | Ht 61.0 in | Wt 159.2 lb

## 2023-01-05 DIAGNOSIS — Z79899 Other long term (current) drug therapy: Secondary | ICD-10-CM | POA: Diagnosis not present

## 2023-01-05 DIAGNOSIS — E785 Hyperlipidemia, unspecified: Secondary | ICD-10-CM

## 2023-01-05 DIAGNOSIS — I5032 Chronic diastolic (congestive) heart failure: Secondary | ICD-10-CM | POA: Diagnosis not present

## 2023-01-05 DIAGNOSIS — R0609 Other forms of dyspnea: Secondary | ICD-10-CM

## 2023-01-05 DIAGNOSIS — I25119 Atherosclerotic heart disease of native coronary artery with unspecified angina pectoris: Secondary | ICD-10-CM

## 2023-01-05 DIAGNOSIS — Z789 Other specified health status: Secondary | ICD-10-CM | POA: Diagnosis not present

## 2023-01-05 DIAGNOSIS — R0789 Other chest pain: Secondary | ICD-10-CM | POA: Diagnosis not present

## 2023-01-05 DIAGNOSIS — I480 Paroxysmal atrial fibrillation: Secondary | ICD-10-CM | POA: Diagnosis not present

## 2023-01-05 DIAGNOSIS — J449 Chronic obstructive pulmonary disease, unspecified: Secondary | ICD-10-CM | POA: Diagnosis not present

## 2023-01-05 DIAGNOSIS — N183 Chronic kidney disease, stage 3 unspecified: Secondary | ICD-10-CM | POA: Diagnosis not present

## 2023-01-05 DIAGNOSIS — E876 Hypokalemia: Secondary | ICD-10-CM

## 2023-01-05 DIAGNOSIS — D509 Iron deficiency anemia, unspecified: Secondary | ICD-10-CM | POA: Diagnosis not present

## 2023-01-05 MED ORDER — POTASSIUM CHLORIDE ER 10 MEQ PO TBCR: 20.0000 meq | EXTENDED_RELEASE_TABLET | Freq: Two times a day (BID) | ORAL | 6 refills | Status: AC

## 2023-01-05 NOTE — Progress Notes (Unsigned)
Cardiology Office Note:  .   Date: 01/05/2023 ID:  [name], DOB Sep 22, 1932, MRN [id number] PCP: Sheela Stack  [facility] Providers Cardiologist:  Dina Rich, MD Electrophysiologist:  Lewayne Bunting, MD    History of Present Illness: .   [name] is a 89 y.o. female with a PMH of chronic diastolic CHF, PAF, CAD, hx of NSVT, IDA, hx of syncope, CKD stage 3, COPD, SHOB, who presents today for scheduled follow-up.   Today she presents for follow-up with her daughter. Admits to atypical, intermittent CP, stable SHOB. Following Pulmonology and will have an upcoming CT scan to evaluate her lungs. Believes her symptoms are related to her arthritis and spinal problems. Recently saw Dr. Barnett Abu (with [facility]), who remarked on some degenerative changes of her spine. Mentioned about some lumbar spinal stenosis as well. Dr. Danielle Dess recommended she discuss with her PCP regarding seeing a Pain Management Specialist, as he felt patient would not be a good surgical candidate. Denies any palpitations, syncope, presyncope, dizziness, orthopnea, PND, swelling or significant weight changes, acute bleeding, or claudication.  Studies Reviewed: [name] Kitchen    ABI 09/2022:  Summary:  Right: Resting right ankle-brachial index indicates moderate right lower  extremity arterial disease. The right toe-brachial index is abnormal.   Left: Resting left ankle-brachial index indicates mild left lower  extremity arterial disease. The left toe-brachial index is abnormal.  Cardiac monitor 09/2022:    4 day monitor   100% afib burden rates 57 to 162, avg 90 bpm.   Rare ventricular ectopy in the form of isolated PVCs, couplets   Reported symptoms correlated with afib normal rates, though some limitations on time accuracy     Patch Wear Time:  4 days and 4 hours (2024-04-24T14:29:15-0400 to 2024-04-28T18:35:23-0400)   Atrial Fibrillation occurred continuously (100%  burden), ranging from 57-162 bpm (avg of 90 bpm). Isolated VEs were rare (<1.0%), VE Couplets were rare (<1.0%), and no VE Triplets were present.    Echo 07/2022:  1. Left ventricular ejection fraction, by estimation, is 60 to 65%. The  left ventricle has normal function. The left ventricle has no regional  wall motion abnormalities. Left ventricular diastolic function could not  be evaluated.   2. Right ventricular systolic function is normal. The right ventricular  size is normal. Tricuspid regurgitation signal is inadequate for assessing  PA pressure.   3. Left atrial size was mild to moderately dilated.   4. The mitral valve is abnormal. No evidence of mitral valve  regurgitation. No evidence of mitral stenosis. Moderate mitral annular  calcification.   5. The aortic valve is calcified. There is moderate calcification of the  aortic valve. Aortic valve regurgitation is mild. No aortic stenosis is  present.   6. The inferior vena cava is normal in size with greater than 50%  respiratory variability, suggesting right atrial pressure of 3 mmHg.   Comparison(s): No significant change from prior study.   Myoview 06/2020:  There was no ST segment deviation noted during stress. The study is normal. There are no perfusion defects consistent with prior infarct or current ischemia. This is a low risk study. The left ventricular ejection fraction is hyperdynamic (>65%).   LHC 2017: Ost RCA lesion, 80 %stenosed. Ost Cx to Prox Cx lesion, 0 %stenosed. Ost LAD to Prox LAD lesion, 20 %stenosed. 1st Diag lesion, 20 %stenosed. 4th Mrg lesion, 20 %stenosed. The left ventricular systolic function is normal. LV end diastolic pressure  is mildly elevated. The left ventricular ejection fraction is 60-65% by visual estimate.   Normal LV function with an ejection fraction of 60-65%.  There is evidence for left ventricular hypertrophy.   Coronary obstructive disease with evidence for smooth eccentric  plaque in the proximal LAD of 20%, 20% stenosis in the inferior branch of the first diagonal vessel; a widely patent previously placed left circumflex stent extending from the ostium to the first marginal takeoff in a large dominant circumflex vessel with 20% distal narrowing; and a small nondominant RCA with 80% ostial stenosis.   RECOMMENDATION: Medical therapy.  Of note, the patient does have significant change in respiratory intrathoracic pressure from inspiratory and expiratory cycles.   Risk Assessment/Calculations:    CHA2DS2-VASc Score = 6  This indicates a 9.7% annual risk of stroke. The patient's score is based upon: CHF History: 1 HTN History: 1 Diabetes History: 0 Stroke History: 0 Vascular Disease History: 1 Age Score: 2 Gender Score: 1       Physical Exam:   VS:  BP 122/72   Pulse 78   Ht 5\' 1"  (1.549 m)   Wt 159 lb 3.2 oz (72.2 kg)   SpO2 96%   BMI 30.08 kg/m    Wt Readings from Last 3 Encounters:  01/05/23 159 lb 3.2 oz (72.2 kg)  12/06/22 162 lb 14.7 oz (73.9 kg)  11/24/22 163 lb (73.9 kg)    GEN: Well nourished, well developed in no acute distress NECK: No JVD; No carotid bruits CARDIAC: S1/S2, RRR, no murmurs, rubs, gallops RESPIRATORY:  Clear to auscultation without rales, wheezing or rhonchi  EXTREMITIES:  No edema; No deformity   ASSESSMENT AND PLAN: .    Chronic diastolic CHF, medication management Stage C, NYHA class II symptoms. Echo 07/2022 revealed EF 60-65%. Recent BNP improved to 208.0. Euvolemic and well compensated on exam. Will hold Lasix x 2 days, then resume at normal dosage.  Will increase Potassium to 20 mEq BID and repeat BMET in 1 week. Continue rest of medication regimen. Low sodium diet, fluid restriction <2L, and daily weights encouraged. Educated to contact our office for weight gain of 2 lbs overnight or 5 lbs in one week.    CAD, atypical chest pain, DOE Admits to transient episodes of atypical chest pain, appears to be  pulmonary/MSK/Neuro related. Not in acute distress on exam. Reviewed NST in 2022 was normal. Will continue to monitor at this time. Not on ASA d/t being on Eliquis. Not able to tolerate statins. Continue current medication regimen. Heart healthy diet encouraged. ED precautions discussed. Follow-up with Pulmonology as scheduled.    3. HLD, statin intolerance Past LDL 132. Not on any lipid lowering medications, unable to tolerate Zetia/statins. Previously referred to [facility]. Heart healthy diet encouraged.    4. PAF Denies any recent palpitations or tachycardia. HR well controlled today. Continue diltiazem, metoprolol, and Eliquis, on appropriate dosage and denies any bleeding issues. Heart healthy diet encouraged.    5. COPD, DOE Admits to stable DOE.  Low suspicion for PE as she is on Eliquis. Has upcoming CT scan with Pulmonology. Continue current medication regimen. Heart healthy diet encouraged. ED precautions discussed. Follow-up with Dr. Vassie Loll as scheduled.    6. CKD stage 3 Most recent sCr 1.45 with eGFR 35. Avoid nephrotoxic agents. Obtaining labwork as mentioned above. Continue to follow with PCP.   7. IDA Continue to follow-up with Hem/Onc.   8. Hypokalemia Recent labwork showed K+ at 2.8. Will hold Lasix  x 2 days and will double potassium supplement and repeat BMET in 1 week. Continue to follow with PCP.  Dispo: Discussed/reviewed goals of care. Will route note to LCSW as patient/daughter had questions regarding DNR. Follow-up with me or APP in 3 months or sooner if anything changes.   Signed, Sharlene Dory, NP

## 2023-01-05 NOTE — Patient Instructions (Addendum)
Medication Instructions:   Hold your Furosemide (Lasix) x 2 day, then resume previous dose  Increase Potassium to twice a day - new prescription sent to pharm Continue all other medications.     Labwork:  BMET - order given Please do in 1 week Office will contact with results via phone, letter or mychart.     Testing/Procedures:  none  Follow-Up:  3  months   Any Other Special Instructions Will Be Listed Below (If Applicable).   If you need a refill on your cardiac medications before your next appointment, please call your pharmacy.

## 2023-01-10 ENCOUNTER — Telehealth (HOSPITAL_COMMUNITY): Payer: Self-pay | Admitting: Licensed Clinical Social Worker

## 2023-01-10 NOTE — Telephone Encounter (Signed)
CSW contacted patient's daughter to follow up on referral to discuss advanced directives. Patient's daughter [name] shared that she has HPOA and financial POA. She shared that patient has expressed to her that "if I go then let me go... I don't want anything done". Daughter asking if a living will is needed and how to obtain an DNR. CSW discussed both documents and that she would need to have a physician to complete an out of hospital DNR. Daughter plans to reach out to PCP to discuss further. CSW available as needed. Lasandra Beech, LCSW, CCSW-MCS [phone]

## 2023-01-15 DIAGNOSIS — Z79899 Other long term (current) drug therapy: Secondary | ICD-10-CM | POA: Diagnosis not present

## 2023-01-16 ENCOUNTER — Ambulatory Visit (HOSPITAL_COMMUNITY): Payer: PPO

## 2023-01-24 DIAGNOSIS — R32 Unspecified urinary incontinence: Secondary | ICD-10-CM | POA: Diagnosis not present

## 2023-01-24 DIAGNOSIS — M545 Low back pain, unspecified: Secondary | ICD-10-CM | POA: Diagnosis not present

## 2023-01-24 DIAGNOSIS — R300 Dysuria: Secondary | ICD-10-CM | POA: Diagnosis not present

## 2023-01-24 DIAGNOSIS — Z6830 Body mass index (BMI) 30.0-30.9, adult: Secondary | ICD-10-CM | POA: Diagnosis not present

## 2023-01-30 DIAGNOSIS — G549 Nerve root and plexus disorder, unspecified: Secondary | ICD-10-CM | POA: Insufficient documentation

## 2023-01-30 NOTE — Progress Notes (Signed)
Name: [name] DOB: [birth year] MRN: [id number]  History of Present Illness: [name] is a 89 y.o. female who presents today as a new patient at [facility] [place]. All available relevant medical records have been reviewed. She is accompanied by her daughter [name], who assists with providing history due to patient's mild cognitive impairment. - GU / GYN History: 1. Pelvic organ prolapse (resolved).  - She underwent a "bladder tack" procedure by Dr. Mora Appl (GYN) in March 2012; no mesh was used. - Prior hysterectomy. 2. Stress urinary incontinence (resolved). - 11/09/2011: Underwent TVT mid-urethral sling placement by Dr. Jerre Simon. 3. OAB with urinary urgency and urge incontinence. 4. Kidney stone(s). - 10/29/2020: CT abdomen/pelvis w/ contrast showed a 2 mm non-obstructing right kidney stone. - She reports passing stone spontaneously in the past; denies any prior stone surgeries. 5. CKD stage 3. GFR = 30 on 01/15/2023. Denies being established with Nephrology.  Today: She reports that she completed a 7 day course of Doxycycline for a UTI on Friday 02/02/2023 but now feels like the UTI symptoms are returning. She reports increased urinary urgency, frequency ("every 15 minutes"), nocturia x8-10, urge incontinence, dysuria, hesitancy, dribbling urinary stream, and sensations of incomplete emptying. Reports drinking 2 caffeinated beverages per day on average.   Also reports low grade fever around 99 F, sweating, headache, fatigue, general malaise. Wears several Depends per day (6-8).  Denies gross hematuria. She denies flank pain; has chronic low back pain.   She denies vaginal pain, bleeding, discharge. She denies vaginal bulge sensation.    Fall Screening: Do you usually have a device to assist in your mobility? Yes   Medications: Current Outpatient Medications  Medication Sig Dispense Refill   estradiol (ESTRACE) 0.1 MG/GM vaginal cream Discard plastic applicator. Insert  a blueberry size amount (approximately 1 gram) of cream on fingertip inside vagina at bedtime every night for 1 week then 2 nights per week for long term use. 30 g 3   fosfomycin (MONUROL) 3 g PACK Take 1 dose. Repeat 3 days later (dose 2). Then repeat another 3 days later (dose 3). 9 g 0   acetaminophen (TYLENOL) 500 MG tablet Take 500 mg by mouth every 6 (six) hours as needed for headache or moderate pain.     albuterol (VENTOLIN HFA) 108 (90 Base) MCG/ACT inhaler Inhale 2 puffs into the lungs every 4 (four) hours as needed.     ALPRAZolam (XANAX) 0.5 MG tablet Take 0.5 mg by mouth daily as needed for anxiety or sleep.     apixaban (ELIQUIS) 5 MG TABS tablet Take 1 tablet (5 mg total) by mouth 2 (two) times daily. 180 tablet 3   ascorbic acid (VITAMIN C) 500 MG tablet Take 1 tablet (500 mg total) by mouth daily. 30 tablet 2   diltiazem (CARDIZEM CD) 180 MG 24 hr capsule Take 1 capsule (180 mg total) by mouth daily before breakfast. 90 capsule 3   diltiazem (TIAZAC) 120 MG 24 hr capsule Take 120 mg by mouth at bedtime.     DULoxetine (CYMBALTA) 30 MG capsule Take 30 mg by mouth daily.     fluticasone (CUTIVATE) 0.05 % cream Apply 1 application topically 2 (two) times daily as needed (skin tears).     furosemide (LASIX) 40 MG tablet Take 1 tablet (40 mg total) by mouth daily. 135 tablet 3   hydrocortisone (ANUSOL-HC) 2.5 % rectal cream Place 1 application rectally 2 (two) times daily. 30 g 1   isosorbide mononitrate (  IMDUR) 30 MG 24 hr tablet Take 1 tablet (30 mg total) by mouth daily. 90 tablet 3   magnesium oxide (MAG-OX) 400 (240 Mg) MG tablet Take 1 tablet by mouth daily.     metoprolol succinate (TOPROL-XL) 50 MG 24 hr tablet Take 1 tablet (50 mg total) by mouth 2 (two) times daily. Take with or immediately following a meal. 180 tablet 3   Multiple Vitamins-Minerals (HAIR/SKIN/NAILS) CAPS Take 1 tablet by mouth daily.     Naphazoline HCl (CLEAR EYES OP) Place 1 drop into both eyes daily as  needed (itching).     nitroGLYCERIN (NITROSTAT) 0.4 MG SL tablet DISSOLVE ONE TABLET UNDER TONGUE EVERY 5 MINUTES UP TO 3 DOSES AS NEEDED FOR CHEST PAIN. 25 tablet 6   pantoprazole (PROTONIX) 40 MG tablet TAKE ONE TABLET BY MOUTH TWICE DAILY BEFORE MEALS 60 tablet 5   potassium chloride (KLOR-CON) 10 MEQ tablet Take 2 tablets (20 mEq total) by mouth 2 (two) times daily. 120 tablet 6   traMADol (ULTRAM) 50 MG tablet Take 1 tablet (50 mg total) by mouth every 6 (six) hours as needed. 90 tablet 3   TRELEGY ELLIPTA 100-62.5-25 MCG/ACT AEPB Take 1 puff by mouth daily.     triamcinolone cream (KENALOG) 0.1 % Apply topically 2 (two) times daily.     zinc sulfate 220 (50 Zn) MG capsule Take 1 capsule (220 mg total) by mouth daily. 30 capsule 3   No current facility-administered medications for this visit.    Allergies: Allergies  Allergen Reactions   Cephalosporins Diarrhea and Nausea Only    Lightheaded   Levaquin [Levofloxacin In D5w] Swelling   Macrodantin [Nitrofurantoin Macrocrystal] Swelling   Phenothiazines Anaphylaxis and Hives   Polysorbate Anaphylaxis   Prednisone Shortness Of Breath   Buspirone Itching   Cardura [Doxazosin Mesylate] Itching   Codeine Itching   Acyclovir And Related Itching    Redness of skin   Colcrys [Colchicine] Nausea Only    Upset stomach    Prochlorperazine Other (See Comments)    "Upset stomach"   Ranexa [Ranolazine]     Severe drop in BP   Sulfa Antibiotics Other (See Comments)    Severe blisters   Atorvastatin Hives    Cramping; tolerates Crestor ok   Colestipol Palpitations   Ofloxacin Rash   Other Itching and Rash    "WOOL"= make skin look like it has been burned   Penicillins Other (See Comments)    Causes redness all over. Has patient had a PCN reaction causing immediate rash, facial/tongue/throat swelling, SOB or lightheadedness with hypotension: No Has patient had a PCN reaction causing severe rash involving mucus membranes or skin  necrosis: No Has patient had a PCN reaction that required hospitalization No Has patient had a PCN reaction occurring within the last 10 years: No If all of the above answers are "NO", then may proceed with Cephalosporin use.    Pimozide Hives and Itching   Zetia [Ezetimibe] Itching and Rash    Past Medical History:  Diagnosis Date   Anxiety    Arthritis    Atrial fibrillation (HCC)    Bursitis    Left shoulder   Cataract    CHF (congestive heart failure) (HCC)    CKD (chronic kidney disease)    stage 3-4   COPD (chronic obstructive pulmonary disease) (HCC)    Coronary atherosclerosis of native coronary artery    a. s/p DES to LCx in 04/2013 b. cath in 11/2015 showing patent stent  with 20% prox-LAD and 80% ostial RCA stenosis for which medical management was recommended due to small artery size   Depression    Diastolic heart failure (HCC)    EF 55-60%   Dysphagia, unspecified(787.20)    Dyspnea    Dysrhythmia    Essential hypertension    GERD (gastroesophageal reflux disease)    Hx Schatzki's ring, multiple EGD/ED last 01/06/2004   Gout    Headache    History of anemia    Hyperlipidemia    Internal hemorrhoids without mention of complication    MI (myocardial infarction) (HCC) 2006   Microscopic colitis 2003   Panic disorder without agoraphobia    Paresthesia    Pneumonia 12/2011   PVD (peripheral vascular disease) (HCC)    S/P colonoscopy 09/27/2001   internal hemorrhoids, desc colon inflam polyp, SB BX-chronic duodenitis, colitis   Sleep apnea    Thyroid disease    Past Surgical History:  Procedure Laterality Date   ABDOMINAL HYSTERECTOMY     ABDOMINAL HYSTERECTOMY     AGILE CAPSULE N/A 08/30/2020   Procedure: AGILE CAPSULE;  Surgeon: Corbin Ade, MD;  [facility];  Service: Endoscopy;  Laterality: N/A;  7:30am   ANTERIOR AND POSTERIOR REPAIR     with resection of vagina   ANTERIOR LAT LUMBAR FUSION N/A 08/01/2016   Procedure: Lumbar Two-Lumbar  Five Transpsoas lateral interbody fusion with Lumbar Two-Three lateral plate fixation;  Surgeon: Loura Halt Ditty, MD;  Location: [facility] OR;  Service: Neurosurgery;  Laterality: N/A;  L2-5 Transpsoas lateral interbody fusion with L2-3 lateral plate fixation   APPENDECTOMY     BACK SURGERY     BIOPSY  07/05/2015   Procedure: BIOPSY;  Surgeon: Corbin Ade, MD;  [facility]: [facility];  Service: Endoscopy;;  gastric polyp biopsy, ascending colon biopsy   BIOPSY  02/16/2020   Procedure: BIOPSY;  Surgeon: Corbin Ade, MD;  [facility]: [facility];  Service: Endoscopy;;   BIOPSY  06/28/2020   Procedure: BIOPSY;  Surgeon: Corbin Ade, MD;  [facility];  Service: Endoscopy;;   BLADDER SUSPENSION  11/09/2011   Procedure: TRANSVAGINAL TAPE (TVT) PROCEDURE;  Surgeon: Ky Barban, MD;  Location: [facility];  Service: Urology;  Laterality: N/A;   bladder tack  06/2010   BREAST LUMPECTOMY  1998   left, benign   CARDIAC CATHETERIZATION     CARDIAC CATHETERIZATION     CARDIAC CATHETERIZATION N/A 12/16/2015   Procedure: Left Heart Cath and Coronary Angiography;  Surgeon: Lennette Bihari, MD;  Location: [facility] INVASIVE CV LAB;  Service: Cardiovascular;  Laterality: N/A;   CARDIOVERSION N/A 10/04/2017   Procedure: CARDIOVERSION;  Surgeon: Laqueta Linden, MD;  Location: [facility];  Service: Cardiovascular;  Laterality: N/A;   CARDIOVERSION N/A 01/30/2018   Procedure: CARDIOVERSION;  Surgeon: Laqueta Linden, MD;  [facility];  Service: Cardiovascular;  Laterality: N/A;   CARDIOVERSION N/A 11/10/2019   Procedure: CARDIOVERSION;  Surgeon: Wendall Stade, MD;  Location: [facility] ENDOSCOPY;  Service: Cardiovascular;  Laterality: N/A;   CARPAL TUNNEL RELEASE  1989   left   cataract surgery     CHOLECYSTECTOMY  1998   Cholecystectomy     COLONOSCOPY  03/16/2011   multiple hyperplastic colon polyps, sigmoid diverticulosis, melanosis coli   COLONOSCOPY WITH PROPOFOL N/A 07/05/2015    RMR:one 5 mm polyp in descending colon   COLONOSCOPY WITH PROPOFOL N/A 06/28/2020   Procedure: COLONOSCOPY WITH PROPOFOL;  Surgeon:  Rourk, Gerrit Friends, MD;  [facility];  Service: Endoscopy;  Laterality: N/A;  am appt   CORONARY ANGIOGRAPHY N/A 05/16/2018   Procedure: CORONARY ANGIOGRAPHY (CATH LAB);  Surgeon: Lyn Records, MD;  Location: [facility] INVASIVE CV LAB;  Service: Cardiovascular;  Laterality: N/A;   CORONARY ANGIOPLASTY WITH STENT PLACEMENT  2015   ESOPHAGEAL DILATION N/A 07/05/2015   Procedure: ESOPHAGEAL DILATION;  Surgeon: Corbin Ade, MD;  [facility];  Service: Endoscopy;  Laterality: N/A;   ESOPHAGOGASTRODUODENOSCOPY (EGD) WITH PROPOFOL N/A 07/05/2015   [id number]   ESOPHAGOGASTRODUODENOSCOPY (EGD) WITH PROPOFOL N/A 02/16/2020   Procedure: ESOPHAGOGASTRODUODENOSCOPY (EGD) WITH PROPOFOL;  Surgeon: Corbin Ade, MD;  Normal examined esophagus s/p dilation, erythematous mucosa in the stomach s/p biopsy, normal examined duodenum. Pathology with mild gastropathy, negative for H. Pylori.    GIVENS CAPSULE STUDY N/A 11/10/2020   Procedure: GIVENS CAPSULE STUDY;  Surgeon: Corbin Ade, MD;  [facility];  Service: Endoscopy;  Laterality: N/A;  7:30am   JOINT REPLACEMENT Right 2007   right knee   left hand surgery     LEFT HEART CATHETERIZATION WITH CORONARY ANGIOGRAM N/A 05/14/2013   Procedure: LEFT HEART CATHETERIZATION WITH CORONARY ANGIOGRAM;  Surgeon: Micheline Chapman, MD;  Location: [facility] C[facility] LAB;  Service: Cardiovascular;  Laterality: N/A;   left rotator cuff surgery     LUMBAR LAMINECTOMY/DECOMPRESSION MICRODISCECTOMY N/A 10/11/2012   Procedure: LUMBAR LAMINECTOMY/DECOMPRESSION MICRODISCECTOMY 2 LEVELS;  Surgeon: Karn Cassis, MD;  Location: [facility] NE[facility]O [facility];  Service: Neurosurgery;  Laterality: N/A;  L3-4 L4-5 Laminectomy   LUMBAR WOUND DEBRIDEMENT N/A 09/27/2015   Procedure: Exploration of Lumbar Wound w/ Repair CSF Leak/Lumbar Drain Placement;   Surgeon: Hilda Lias, MD;  Location: [facility] NEURO ORS;  Service: Neurosurgery;  Laterality: N/A;   MALONEY DILATION  03/16/2011   Gastritis. No H.pylori on bx. 27F maloney dilation with disruption of  occult cevical esophageal web   MALONEY DILATION N/A 02/16/2020   Procedure: MALONEY DILATION;  Surgeon: Corbin Ade, MD;  [facility];  Service: Endoscopy;  Laterality: N/A;   NASAL SINUS SURGERY     right knee replacement  2007   right leg benign tumor     SHOULDER SURGERY Left    TEE WITHOUT CARDIOVERSION N/A 10/04/2017   Procedure: TRANSESOPHAGEAL ECHOCARDIOGRAM (TEE) WITH PROPOFOL;  Surgeon: Laqueta Linden, MD;  Location: [facility];  Service: Cardiovascular;  Laterality: N/A;   TEE WITHOUT CARDIOVERSION N/A 11/10/2019   Procedure: TRANSESOPHAGEAL ECHOCARDIOGRAM (TEE);  Surgeon: Wendall Stade, MD;  Location: [facility] ENDOSCOPY;  Service: Cardiovascular;  Laterality: N/A;   TONSILLECTOMY     unspecified area, hysterectomy  1972   partial   Family History  Problem Relation Age of Onset   Stroke Mother    Parkinson's disease Father    Coronary artery disease Other        family Hx-sons   Cancer Other    Stroke Other        family Hx   Hypertension Other        family Hx   Diabetes Brother    Heart disease Son        before age 52   Diabetes Son    Stroke Daughter 16   Colon cancer Grandson        diagnosed 40   Inflammatory bowel disease Neg Hx    Social History   Socioeconomic History   Marital status: Divorced    Spouse  name: Not on file   Number of children: 5   Years of education: Not on file   Highest education level: Not on file  Occupational History   Occupation: retired  Tobacco Use   Smoking status: Former    Current packs/day: 0.00    Average packs/day: 1 pack/day for 64.0 years (64.0 ttl pk-yrs)    Types: Cigarettes    Start date: 12/24/1947    Quit date: 11/17/2001    Years since quitting: 21.2   Smokeless tobacco: Never   Tobacco comments:     Quit smoking in 2003  Vaping Use   Vaping status: Never Used  Substance and Sexual Activity   Alcohol use: Not Currently   Drug use: No   Sexual activity: Never  Other Topics Concern   Not on file  Social History Narrative   Divorced.   Sister had colon perforation & died from complications in [place], [place]   Social Determinants of Health   Financial Resource Strain: Not on file  Food Insecurity: Not on file  Transportation Needs: Not on file  Physical Activity: Not on file  Stress: Not on file  Social Connections: Not on file  Intimate Partner Violence: Not on file    SUBJECTIVE  Review of Systems Constitutional: As per HPI lntegumentary: Patient denies any rashes or pruritus Cardiovascular: Patient denies chest pain or syncope Respiratory: Patient denies shortness of breath Gastrointestinal: Patient denies vomiting, constipation, or diarrhea. Mild nausea.  Musculoskeletal: Patient denies muscle cramps or weakness Neurologic: Patient denies convulsions or seizures Allergic/Immunologic: Patient denies recent allergic reaction(s) Hematologic/Lymphatic: Patient denies bleeding tendencies Endocrine: Patient denies heat/cold intolerance  GU: As per HPI.  OBJECTIVE Vitals:   02/06/23 0858  BP: (!) 156/75  Pulse: 88  Temp: 97.6 F (36.4 C)   There is no height or weight on file to calculate BMI.  Physical Examination Constitutional: No obvious distress; patient is non-toxic appearing  Cardiovascular: No visible lower extremity edema.  Respiratory: The patient does not have audible wheezing/stridor; respirations do not appear labored  Gastrointestinal: Abdomen non-distended Musculoskeletal: Normal ROM of UEs  Skin: No obvious rashes/open sores  Neurologic: CN 2-12 grossly intact Psychiatric: Answered questions appropriately with normal affect  Hematologic/Lymphatic/Immunologic: No obvious bruises or sites of spontaneous bleeding  UA: 6-10 WBC/hpf, >30 RBC/hpf,  bacteria (moderate) PVR: 0 ml  ASSESSMENT Recurrent UTI - Plan: CT HEMATURIA WORKUP, Urine Culture, estradiol (ESTRACE) 0.1 MG/GM vaginal cream, fosfomycin (MONUROL) 3 g PACK  Bladder pain - Plan: Urinalysis, Routine w reflex microscopic, BLADDER SCAN AMB NON-IMAGING, CT HEMATURIA WORKUP, fosfomycin (MONUROL) 3 g PACK  Urinary urgency - Plan: CT HEMATURIA WORKUP, fosfomycin (MONUROL) 3 g PACK  Urinary frequency - Plan: CT HEMATURIA WORKUP, fosfomycin (MONUROL) 3 g PACK  Urge incontinence - Plan: CT HEMATURIA WORKUP, fosfomycin (MONUROL) 3 g PACK  Abnormal urinalysis - Plan: CT HEMATURIA WORKUP, Urine Culture, fosfomycin (MONUROL) 3 g PACK  UTI symptoms - Plan: CT HEMATURIA WORKUP, Urine Culture, fosfomycin (MONUROL) 3 g PACK  Microscopic hematuria - Plan: CT HEMATURIA WORKUP, fosfomycin (MONUROL) 3 g PACK  Presence of urogenital implant - Plan: CT HEMATURIA WORKUP, Urine Culture, fosfomycin (MONUROL) 3 g PACK  Stage 3b chronic kidney disease (HCC) - Plan: Ambulatory referral to Nephrology  Abnormal UA with UTI symptoms. Will check urine culture and treat empirically with Fosfomycin 3 grams x3 doses each 3 days apart while awaiting culture results and sensitivities.  History of recurrent UTIs:  We reviewed the diagnostic criteria for recurrent UTI  and that we do not currently have enough urine culture data to support or refute the formal diagnosis of recurrent UTI. Will request all prior urinalysis, urine microscopy, and urine culture results over the past 12 months from [facility].   We considered other possible etiologies for UTI symptoms such as symptomatic vaginal atrophy, post infectious bladder inflammation, cystitis cystica, eosinophilic cystitis, interstitial cystitis, stone passage, TVT mesh erosion, etc.  We discussed the possible etiologies of recurrent UTls including ascending infection related to intercourse; vaginal atrophy; transmural infection that has been treated  incompletely; urinary tract stones; incomplete bladder emptying with urinary stasis; kidney or bladder tumor; urethral diverticulum; and colonization of  vagina and urinary tract with pathologic, adherent organisms.   Her UTI management is complicated due to her multiple antibiotic allergies.   For UTI prevention we discussed options including: - Topical vaginal estrogen for vaginal atrophy. The etiology and consequences of urogenital epithelial atrophy was explained to patient. The thinning of the epithelium of the urethra can contribute to urinary urgency and frequency syndromes. In addition, the normal bacterial flora that colonizes the perineum may contribute to UTI risk because the thin urethral epithelium allows the bacteria to become adherent and the change in vaginal pH can disrupt the vaginal / urethral microbiome and allow for bacterial overgrowth. Patient was advised that topical vaginal estrogen replacement will take about 3 months to restore the vaginal pH and may sting/burn initially due to severe dryness, which will improve with ongoing treatment. OK to have sex with any of the topical vaginal estrogen replacement options. We discussed that there have been studies that evaluate use of low-dose intravaginal estrogen cream that shows minimal systemic absorption that is negligible after 3 weeks. There have been no studies indicating increased risk of contributing to breast cancer development or recurrence.  If recurrent UTls persist, may consider UTI prophylaxis with a daily low dose antibiotic in the future.  We discussed the role of diagnostic testing for further evaluation and investigation of potential infectious nidus such as: cystoscopy to assess for mesh erosion CT to assess current stone burden and due to microscopic hematuria  OAB with urinary urgency and urge incontinence. Will plan to address this after UTI issue is under control. She has some neurogenic risk factors including  spinal stenosis, prior spine surgery, tremor, peripheral neuropathy.  Pelvic organ prolapse (resolved). Asymptomatic.  Stress urinary incontinence (resolved). Discussed her increased risk for mesh erosion secondary to vaginal atrophy and that mesh erosion is a possible etiology for her recurrent UTIs / LUTS.   CKD stage 3: Also advised patient to establish care with Nephrology for her CKD; referral placed.  Pt & daughter verbalized understanding and agreement. All questions were answered.   PLAN Advised the following: 1. Requesting prior urinalysis, urine microscopy, and urine culture results from [facility].  2. Urine culture. 3. Fosfomycin 3 grams x3 doses each 3 days apart.  4. Start topical vaginal estrogen cream (nightly use). 5. CT ordered. 6. Return for 1st available cystoscopy with any urology MD. 7. Nephrology referral placed.  Orders Placed This Encounter  Procedures   Urine Culture   Microscopic Examination   CT HEMATURIA WORKUP    Standing Status:   Future    Standing Expiration Date:   02/06/2024    Order Specific Question:   Reason for Exam (SYMPTOM  OR DIAGNOSIS REQUIRED)    Answer:   Microscopic hematuria    Order Specific Question:   Preferred imaging location?    Answer:  [facility]   Urinalysis, Routine w reflex microscopic   Ambulatory referral to Nephrology    Referral Priority:   Routine    Referral Type:   Consultation    Referral Reason:   Specialty Services Required    Requested Specialty:   Nephrology    Number of Visits Requested:   1   BLADDER SCAN AMB NON-IMAGING   It has been explained that the patient is to follow regularly with their PCP in addition to all other providers involved in their care and to follow instructions provided by these respective offices. Patient advised to contact urology clinic if any urologic-pertaining questions, concerns, new symptoms or problems arise in the interim period.  Patient Instructions  Plan  today: 1. Requesting prior urinalysis, urine microscopy, and urine culture results from [facility].  2. Urine culture today. 3. Take antibiotic as prescribed (Fosfomycin).  4. Start topical vaginal estrogen cream (nightly use). 5. Schedule CT for further evaluation (you will be called by radiology scheduling). 6. Follow up for office cystoscopy with Urology. 7. Nephrology referral placed; you will get a call to schedule 1st appointment there.  Electronically signed by:  Donnita Falls, MSN, FNP-C, CUNP 02/06/2023 10:51 AM

## 2023-02-06 ENCOUNTER — Encounter: Payer: Self-pay | Admitting: Urology

## 2023-02-06 ENCOUNTER — Inpatient Hospital Stay: Payer: PPO | Attending: Physician Assistant

## 2023-02-06 ENCOUNTER — Ambulatory Visit: Payer: PPO | Admitting: Urology

## 2023-02-06 VITALS — BP 156/75 | HR 88 | Temp 97.6°F

## 2023-02-06 DIAGNOSIS — R399 Unspecified symptoms and signs involving the genitourinary system: Secondary | ICD-10-CM | POA: Diagnosis not present

## 2023-02-06 DIAGNOSIS — N3941 Urge incontinence: Secondary | ICD-10-CM

## 2023-02-06 DIAGNOSIS — R3989 Other symptoms and signs involving the genitourinary system: Secondary | ICD-10-CM | POA: Diagnosis not present

## 2023-02-06 DIAGNOSIS — Z960 Presence of urogenital implants: Secondary | ICD-10-CM | POA: Diagnosis not present

## 2023-02-06 DIAGNOSIS — R8290 Unspecified abnormal findings in urine: Secondary | ICD-10-CM | POA: Diagnosis not present

## 2023-02-06 DIAGNOSIS — N390 Urinary tract infection, site not specified: Secondary | ICD-10-CM

## 2023-02-06 DIAGNOSIS — N1832 Chronic kidney disease, stage 3b: Secondary | ICD-10-CM | POA: Diagnosis not present

## 2023-02-06 DIAGNOSIS — R3129 Other microscopic hematuria: Secondary | ICD-10-CM

## 2023-02-06 DIAGNOSIS — R34 Anuria and oliguria: Secondary | ICD-10-CM

## 2023-02-06 DIAGNOSIS — R350 Frequency of micturition: Secondary | ICD-10-CM | POA: Diagnosis not present

## 2023-02-06 DIAGNOSIS — R3915 Urgency of urination: Secondary | ICD-10-CM

## 2023-02-06 LAB — URINALYSIS, ROUTINE W REFLEX MICROSCOPIC
Bilirubin, UA: NEGATIVE
Glucose, UA: NEGATIVE
Ketones, UA: NEGATIVE
Nitrite, UA: NEGATIVE
Protein,UA: NEGATIVE
Specific Gravity, UA: 1.025 (ref 1.005–1.030)
Urobilinogen, Ur: 1 mg/dL (ref 0.2–1.0)
pH, UA: 6 (ref 5.0–7.5)

## 2023-02-06 LAB — MICROSCOPIC EXAMINATION
Epithelial Cells (non renal): >10 /[HPF] — AB (ref 0–10)
RBC, Urine: >30 /[HPF] — AB (ref 0–2)

## 2023-02-06 MED ORDER — ESTRADIOL 0.1 MG/GM VA CREA
TOPICAL_CREAM | VAGINAL | 3 refills | Status: AC
Start: 2023-02-06 — End: ?

## 2023-02-06 MED ORDER — FOSFOMYCIN TROMETHAMINE 3 G PO PACK
PACK | ORAL | 0 refills | Status: DC
Start: 2023-02-06 — End: 2023-04-06

## 2023-02-06 NOTE — Progress Notes (Signed)
post void residual=0 ?

## 2023-02-06 NOTE — Patient Instructions (Signed)
Plan today: 1. Requesting prior urinalysis, urine microscopy, and urine culture results from [facility].  2. Urine culture today. 3. Take antibiotic as prescribed (Fosfomycin).  4. Start topical vaginal estrogen cream (nightly use). 5. Schedule CT for further evaluation (you will be called by radiology scheduling). 6. Follow up for office cystoscopy with Urology. 7. Nephrology referral placed; you will get a call to schedule 1st appointment there.

## 2023-02-08 ENCOUNTER — Telehealth: Payer: Self-pay

## 2023-02-08 LAB — URINE CULTURE: Organism ID, Bacteria: NO GROWTH

## 2023-02-08 NOTE — Telephone Encounter (Signed)
Patient called with no answer. Detailed message left. °

## 2023-02-08 NOTE — Progress Notes (Signed)
Please notify patient: Negative urine culture - can stop Fosfomycin. Symptoms may be due to other etiologies as discussed at last visit. Advised to follow up with CT & cystoscopy as planned.

## 2023-02-08 NOTE — Telephone Encounter (Signed)
-----   Message from [name] sent at 02/08/2023  3:55 PM EDT ----- Please notify patient: Negative urine culture - can stop Fosfomycin. Symptoms may be due to other etiologies as discussed at last visit. Advised to follow up with CT & cystoscopy as planned.

## 2023-02-09 NOTE — Telephone Encounter (Signed)
Covermymeds PA deleted as medication stopped.

## 2023-02-13 ENCOUNTER — Inpatient Hospital Stay: Payer: PPO | Admitting: Physician Assistant

## 2023-02-15 DIAGNOSIS — I509 Heart failure, unspecified: Secondary | ICD-10-CM | POA: Diagnosis not present

## 2023-02-15 DIAGNOSIS — I482 Chronic atrial fibrillation, unspecified: Secondary | ICD-10-CM | POA: Diagnosis not present

## 2023-02-15 DIAGNOSIS — M545 Low back pain, unspecified: Secondary | ICD-10-CM | POA: Diagnosis not present

## 2023-02-15 DIAGNOSIS — J449 Chronic obstructive pulmonary disease, unspecified: Secondary | ICD-10-CM | POA: Diagnosis not present

## 2023-02-20 DIAGNOSIS — M79662 Pain in left lower leg: Secondary | ICD-10-CM | POA: Diagnosis not present

## 2023-02-20 DIAGNOSIS — R079 Chest pain, unspecified: Secondary | ICD-10-CM | POA: Diagnosis not present

## 2023-02-20 DIAGNOSIS — I130 Hypertensive heart and chronic kidney disease with heart failure and stage 1 through stage 4 chronic kidney disease, or unspecified chronic kidney disease: Secondary | ICD-10-CM | POA: Diagnosis not present

## 2023-02-20 DIAGNOSIS — J449 Chronic obstructive pulmonary disease, unspecified: Secondary | ICD-10-CM | POA: Diagnosis not present

## 2023-02-20 DIAGNOSIS — R519 Headache, unspecified: Secondary | ICD-10-CM | POA: Diagnosis not present

## 2023-02-20 DIAGNOSIS — I509 Heart failure, unspecified: Secondary | ICD-10-CM | POA: Diagnosis not present

## 2023-02-20 DIAGNOSIS — M79661 Pain in right lower leg: Secondary | ICD-10-CM | POA: Diagnosis not present

## 2023-02-20 DIAGNOSIS — R0902 Hypoxemia: Secondary | ICD-10-CM | POA: Diagnosis not present

## 2023-02-20 DIAGNOSIS — I517 Cardiomegaly: Secondary | ICD-10-CM | POA: Diagnosis not present

## 2023-02-20 DIAGNOSIS — N189 Chronic kidney disease, unspecified: Secondary | ICD-10-CM | POA: Diagnosis not present

## 2023-02-20 DIAGNOSIS — Z880 Allergy status to penicillin: Secondary | ICD-10-CM | POA: Diagnosis not present

## 2023-02-20 DIAGNOSIS — E785 Hyperlipidemia, unspecified: Secondary | ICD-10-CM | POA: Diagnosis not present

## 2023-02-20 DIAGNOSIS — R0602 Shortness of breath: Secondary | ICD-10-CM | POA: Diagnosis not present

## 2023-02-20 DIAGNOSIS — L03116 Cellulitis of left lower limb: Secondary | ICD-10-CM | POA: Diagnosis not present

## 2023-02-20 DIAGNOSIS — M79605 Pain in left leg: Secondary | ICD-10-CM | POA: Diagnosis not present

## 2023-02-20 DIAGNOSIS — M79606 Pain in leg, unspecified: Secondary | ICD-10-CM | POA: Diagnosis not present

## 2023-02-20 DIAGNOSIS — M79604 Pain in right leg: Secondary | ICD-10-CM | POA: Diagnosis not present

## 2023-02-20 DIAGNOSIS — I4891 Unspecified atrial fibrillation: Secondary | ICD-10-CM | POA: Diagnosis not present

## 2023-02-22 ENCOUNTER — Ambulatory Visit (HOSPITAL_COMMUNITY)
Admission: RE | Admit: 2023-02-22 | Discharge: 2023-02-22 | Disposition: A | Discharge status: Home / Self Care | Payer: PPO | Source: Ambulatory Visit | Attending: Pulmonary Disease | Admitting: Pulmonary Disease

## 2023-02-22 DIAGNOSIS — R590 Localized enlarged lymph nodes: Secondary | ICD-10-CM | POA: Diagnosis not present

## 2023-02-22 DIAGNOSIS — J849 Interstitial pulmonary disease, unspecified: Secondary | ICD-10-CM | POA: Diagnosis not present

## 2023-02-22 DIAGNOSIS — R918 Other nonspecific abnormal finding of lung field: Secondary | ICD-10-CM | POA: Diagnosis not present

## 2023-02-22 DIAGNOSIS — R0600 Dyspnea, unspecified: Secondary | ICD-10-CM | POA: Diagnosis not present

## 2023-03-01 ENCOUNTER — Other Ambulatory Visit: Payer: Self-pay | Admitting: Cardiology

## 2023-03-01 ENCOUNTER — Ambulatory Visit (HOSPITAL_COMMUNITY)
Admission: RE | Admit: 2023-03-01 | Discharge: 2023-03-01 | Disposition: A | Discharge status: Home / Self Care | Payer: PPO | Source: Ambulatory Visit | Attending: Urology | Admitting: Urology

## 2023-03-01 DIAGNOSIS — N1832 Chronic kidney disease, stage 3b: Secondary | ICD-10-CM | POA: Diagnosis not present

## 2023-03-01 DIAGNOSIS — K838 Other specified diseases of biliary tract: Secondary | ICD-10-CM | POA: Diagnosis not present

## 2023-03-01 DIAGNOSIS — R350 Frequency of micturition: Secondary | ICD-10-CM | POA: Insufficient documentation

## 2023-03-01 DIAGNOSIS — R3915 Urgency of urination: Secondary | ICD-10-CM | POA: Insufficient documentation

## 2023-03-01 DIAGNOSIS — R399 Unspecified symptoms and signs involving the genitourinary system: Secondary | ICD-10-CM | POA: Insufficient documentation

## 2023-03-01 DIAGNOSIS — N3941 Urge incontinence: Secondary | ICD-10-CM | POA: Diagnosis not present

## 2023-03-01 DIAGNOSIS — N2889 Other specified disorders of kidney and ureter: Secondary | ICD-10-CM | POA: Diagnosis not present

## 2023-03-01 DIAGNOSIS — R3989 Other symptoms and signs involving the genitourinary system: Secondary | ICD-10-CM | POA: Insufficient documentation

## 2023-03-01 DIAGNOSIS — R3129 Other microscopic hematuria: Secondary | ICD-10-CM | POA: Insufficient documentation

## 2023-03-01 DIAGNOSIS — Z960 Presence of urogenital implants: Secondary | ICD-10-CM | POA: Insufficient documentation

## 2023-03-01 DIAGNOSIS — I5032 Chronic diastolic (congestive) heart failure: Secondary | ICD-10-CM | POA: Diagnosis not present

## 2023-03-01 DIAGNOSIS — R8290 Unspecified abnormal findings in urine: Secondary | ICD-10-CM | POA: Insufficient documentation

## 2023-03-01 DIAGNOSIS — N390 Urinary tract infection, site not specified: Secondary | ICD-10-CM | POA: Diagnosis not present

## 2023-03-01 DIAGNOSIS — I2510 Atherosclerotic heart disease of native coronary artery without angina pectoris: Secondary | ICD-10-CM | POA: Diagnosis not present

## 2023-03-01 LAB — POCT I-STAT CREATININE: Creatinine, Ser: 1.4 mg/dL — ABNORMAL HIGH (ref 0.44–1.00)

## 2023-03-01 MED ORDER — IOHEXOL 300 MG/ML  SOLN
125.0000 mL | Freq: Once | INTRAMUSCULAR | Status: AC | PRN
  Administered 2023-03-01: 85 mL via INTRAVENOUS

## 2023-03-05 NOTE — Progress Notes (Signed)
History of Present Illness: [name] is a 89 y.o. year old female here for cysto to f/u microscopic hematuria as well as recurrent UTIs,LUTS.    Past Medical History:  Diagnosis Date   Anxiety    Arthritis    Atrial fibrillation (HCC)    Bursitis    Left shoulder   Cataract    CHF (congestive heart failure) (HCC)    CKD (chronic kidney disease)    stage 3-4   COPD (chronic obstructive pulmonary disease) (HCC)    Coronary atherosclerosis of native coronary artery    a. s/p DES to LCx in 04/2013 b. cath in 11/2015 showing patent stent with 20% prox-LAD and 80% ostial RCA stenosis for which medical management was recommended due to small artery size   Depression    Diastolic heart failure (HCC)    EF 55-60%   Dysphagia, unspecified(787.20)    Dyspnea    Dysrhythmia    Essential hypertension    GERD (gastroesophageal reflux disease)    Hx Schatzki's ring, multiple EGD/ED last 01/06/2004   Gout    Headache    History of anemia    Hyperlipidemia    Internal hemorrhoids without mention of complication    MI (myocardial infarction) (HCC) 2006   Microscopic colitis 2003   Panic disorder without agoraphobia    Paresthesia    Pneumonia 12/2011   PVD (peripheral vascular disease) (HCC)    S/P colonoscopy 09/27/2001   internal hemorrhoids, desc colon inflam polyp, SB BX-chronic duodenitis, colitis   Sleep apnea    Thyroid disease     Past Surgical History:  Procedure Laterality Date   ABDOMINAL HYSTERECTOMY     ABDOMINAL HYSTERECTOMY     AGILE CAPSULE N/A 08/30/2020   Procedure: AGILE CAPSULE;  Surgeon: Corbin Ade, MD;  Location: [facility];  Service: Endoscopy;  Laterality: N/A;  7:30am   ANTERIOR AND POSTERIOR REPAIR     with resection of vagina   ANTERIOR LAT LUMBAR FUSION N/A 08/01/2016   Procedure: Lumbar Two-Lumbar Five Transpsoas lateral interbody fusion with Lumbar Two-Three lateral plate fixation;  Surgeon: Loura Halt Ditty, MD;  Location: [facility] OR;   Service: Neurosurgery;  Laterality: N/A;  L2-5 Transpsoas lateral interbody fusion with L2-3 lateral plate fixation   APPENDECTOMY     BACK SURGERY     BIOPSY  07/05/2015   Procedure: BIOPSY;  Surgeon: Corbin Ade, MD;  Location: [facility];  Service: Endoscopy;;  gastric polyp biopsy, ascending colon biopsy   BIOPSY  02/16/2020   Procedure: BIOPSY;  Surgeon: Corbin Ade, MD;  [facility]: [facility];  Service: Endoscopy;;   BIOPSY  06/28/2020   Procedure: BIOPSY;  Surgeon: Corbin Ade, MD;  [facility]: [facility];  Service: Endoscopy;;   BLADDER SUSPENSION  11/09/2011   Procedure: TRANSVAGINAL TAPE (TVT) PROCEDURE;  Surgeon: Ky Barban, MD;  Location: [facility];  Service: Urology;  Laterality: N/A;   bladder tack  06/2010   BREAST LUMPECTOMY  1998   left, benign   CARDIAC CATHETERIZATION     CARDIAC CATHETERIZATION     CARDIAC CATHETERIZATION N/A 12/16/2015   Procedure: Left Heart Cath and Coronary Angiography;  Surgeon: Lennette Bihari, MD;  Location: [facility];  Service: Cardiovascular;  Laterality: N/A;   CARDIOVERSION N/A 10/04/2017   Procedure: CARDIOVERSION;  Surgeon: Laqueta Linden, MD;  Location: [facility];  Service: Cardiovascular;  Laterality: N/A;   CARDIOVERSION N/A 01/30/2018   Procedure: CARDIOVERSION;  Surgeon: Laqueta Linden, MD;  [facility]: [facility];  Service: Cardiovascular;  Laterality: N/A;   CARDIOVERSION N/A 11/10/2019   Procedure: CARDIOVERSION;  Surgeon: Wendall Stade, MD;  Location: [facility] ENDOSCOPY;  Service: Cardiovascular;  Laterality: N/A;   CARPAL TUNNEL RELEASE  1989   left   cataract surgery     CHOLECYSTECTOMY  1998   Cholecystectomy     COLONOSCOPY  03/16/2011   multiple hyperplastic colon polyps, sigmoid diverticulosis, melanosis coli   COLONOSCOPY WITH PROPOFOL N/A 07/05/2015   RMR:one 5 mm polyp in descending colon   COLONOSCOPY WITH PROPOFOL N/A 06/28/2020   Procedure: COLONOSCOPY WITH PROPOFOL;  Surgeon: Corbin Ade, MD;  [facility]: [facility];  Service: Endoscopy;  Laterality: N/A;  am appt   CORONARY ANGIOGRAPHY N/A 05/16/2018   Procedure: CORONARY ANGIOGRAPHY (CATH LAB);  Surgeon: Lyn Records, MD;  Location: [facility] INVASIVE CV LAB;  Service: Cardiovascular;  Laterality: N/A;   CORONARY ANGIOPLASTY WITH STENT PLACEMENT  2015   ESOPHAGEAL DILATION N/A 07/05/2015   Procedure: ESOPHAGEAL DILATION;  Surgeon: Corbin Ade, MD;  [facility];  Service: Endoscopy;  Laterality: N/A;   ESOPHAGOGASTRODUODENOSCOPY (EGD) WITH PROPOFOL N/A 07/05/2015   HQI:ONGEXB   ESOPHAGOGASTRODUODENOSCOPY (EGD) WITH PROPOFOL N/A 02/16/2020   Procedure: ESOPHAGOGASTRODUODENOSCOPY (EGD) WITH PROPOFOL;  Surgeon: Corbin Ade, MD;  Normal examined esophagus s/p dilation, erythematous mucosa in the stomach s/p biopsy, normal examined duodenum. Pathology with mild gastropathy, negative for H. Pylori.    GIVENS CAPSULE STUDY N/A 11/10/2020   Procedure: GIVENS CAPSULE STUDY;  Surgeon: Corbin Ade, MD;  [facility]: [facility];  Service: Endoscopy;  Laterality: N/A;  7:30am   JOINT REPLACEMENT Right 2007   right knee   left hand surgery     LEFT HEART CATHETERIZATION WITH CORONARY ANGIOGRAM N/A 05/14/2013   Procedure: LEFT HEART CATHETERIZATION WITH CORONARY ANGIOGRAM;  Surgeon: Micheline Chapman, MD;  Location: [facility] CATH LAB;  Service: Cardiovascular;  Laterality: N/A;   left rotator cuff surgery     LUMBAR LAMINECTOMY/DECOMPRESSION MICRODISCECTOMY N/A 10/11/2012   Procedure: LUMBAR LAMINECTOMY/DECOMPRESSION MICRODISCECTOMY 2 LEVELS;  Surgeon: Karn Cassis, MD;  Location: [facility];  Service: Neurosurgery;  Laterality: N/A;  L3-4 L4-5 Laminectomy   LUMBAR WOUND DEBRIDEMENT N/A 09/27/2015   Procedure: Exploration of Lumbar Wound w/ Repair CSF Leak/Lumbar Drain Placement;  Surgeon: Hilda Lias, MD;  Location: [facility];  Service: Neurosurgery;  Laterality: N/A;   MALONEY DILATION  03/16/2011   Gastritis. No  H.pylori on bx. 69F maloney dilation with disruption of  occult cevical esophageal web   MALONEY DILATION N/A 02/16/2020   Procedure: MALONEY DILATION;  Surgeon: Corbin Ade, MD;  [facility]: [facility];  Service: Endoscopy;  Laterality: N/A;   NASAL SINUS SURGERY     right knee replacement  2007   right leg benign tumor     SHOULDER SURGERY Left    TEE WITHOUT CARDIOVERSION N/A 10/04/2017   Procedure: TRANSESOPHAGEAL ECHOCARDIOGRAM (TEE) WITH PROPOFOL;  Surgeon: Laqueta Linden, MD;  Location: [facility];  Service: Cardiovascular;  Laterality: N/A;   TEE WITHOUT CARDIOVERSION N/A 11/10/2019   Procedure: TRANSESOPHAGEAL ECHOCARDIOGRAM (TEE);  Surgeon: Wendall Stade, MD;  Location: [facility] ENDOSCOPY;  Service: Cardiovascular;  Laterality: N/A;   TONSILLECTOMY     unspecified area, hysterectomy  1972   partial    Home Medications:  (Not in a hospital admission)   Allergies:  Allergies  Allergen Reactions   Cephalosporins  Diarrhea and Nausea Only    Lightheaded   Levaquin [Levofloxacin In D5w] Swelling   Macrodantin [Nitrofurantoin Macrocrystal] Swelling   Phenothiazines Anaphylaxis and Hives   Polysorbate Anaphylaxis   Prednisone Shortness Of Breath   Buspirone Itching   Cardura [Doxazosin Mesylate] Itching   Codeine Itching   Acyclovir And Related Itching    Redness of skin   Colcrys [Colchicine] Nausea Only    Upset stomach    Prochlorperazine Other (See Comments)    "Upset stomach"   Ranexa [Ranolazine]     Severe drop in BP   Sulfa Antibiotics Other (See Comments)    Severe blisters   Atorvastatin Hives    Cramping; tolerates Crestor ok   Colestipol Palpitations   Ofloxacin Rash   Other Itching and Rash    "WOOL"= make skin look like it has been burned   Penicillins Other (See Comments)    Causes redness all over. Has patient had a PCN reaction causing immediate rash, facial/tongue/throat swelling, SOB or lightheadedness with hypotension: No Has patient had a PCN  reaction causing severe rash involving mucus membranes or skin necrosis: No Has patient had a PCN reaction that required hospitalization No Has patient had a PCN reaction occurring within the last 10 years: No If all of the above answers are "NO", then may proceed with Cephalosporin use.    Pimozide Hives and Itching   Zetia [Ezetimibe] Itching and Rash    Family History  Problem Relation Age of Onset   Stroke Mother    Parkinson's disease Father    Coronary artery disease Other        family Hx-sons   Cancer Other    Stroke Other        family Hx   Hypertension Other        family Hx   Diabetes Brother    Heart disease Son        before age 81   Diabetes Son    Stroke Daughter 56   Colon cancer Grandson        diagnosed 40   Inflammatory bowel disease Neg Hx     Social History:  reports that she quit smoking about 21 years ago. Her smoking use included cigarettes. She started smoking about 75 years ago. She has a 64 pack-year smoking history. She has never used smokeless tobacco. She reports that she does not currently use alcohol. She reports that she does not use drugs.  ROS: A complete review of systems was performed.  All systems are negative except for pertinent findings as noted.  Physical Exam:  Vital signs in last 24 hours: @VSRANGES @ General:  Alert and oriented, No acute distress HEENT: Normocephalic, atraumatic Neck: No JVD or lymphadenopathy Cardiovascular: Regular rate  Lungs: Normal inspiratory/expiratory excursion Abdomen: Soft, nontender, nondistended, no abdominal masses Back: No CVA tenderness Extremities: No edema Neurologic: Grossly intact  I have reviewed prior pt notes  I have reviewed urinalysis results  I have independently reviewed prior imaging--CT images reviewed  I have reviewed prior urine culture   Indication: ***  After informed consent and discussion of the procedure and its risks, [name] was positioned and prepped  in the standard fashion.  Cystoscopy was performed with a flexible cystoscope.   Findings:  Urethra:*** Ureteral orifices:*** Bladder:***   Impression/Assessment:  ***  Plan:  ***  Bertram Millard [name] 03/05/2023, 9:42 AM  Bertram Millard. Yohana Bartha MD

## 2023-03-06 ENCOUNTER — Ambulatory Visit (HOSPITAL_BASED_OUTPATIENT_CLINIC_OR_DEPARTMENT_OTHER): Payer: PPO | Admitting: Pulmonary Disease

## 2023-03-06 ENCOUNTER — Ambulatory Visit: Payer: PPO | Admitting: Urology

## 2023-03-06 ENCOUNTER — Encounter (HOSPITAL_BASED_OUTPATIENT_CLINIC_OR_DEPARTMENT_OTHER): Payer: Self-pay | Admitting: Pulmonary Disease

## 2023-03-06 ENCOUNTER — Encounter: Payer: Self-pay | Admitting: Urology

## 2023-03-06 VITALS — BP 118/70 | HR 54 | Ht 62.0 in | Wt 162.0 lb

## 2023-03-06 VITALS — BP 128/77 | HR 61 | Ht 62.0 in | Wt 162.0 lb

## 2023-03-06 DIAGNOSIS — J849 Interstitial pulmonary disease, unspecified: Secondary | ICD-10-CM

## 2023-03-06 DIAGNOSIS — Z87440 Personal history of urinary (tract) infections: Secondary | ICD-10-CM

## 2023-03-06 DIAGNOSIS — N390 Urinary tract infection, site not specified: Secondary | ICD-10-CM

## 2023-03-06 DIAGNOSIS — R3129 Other microscopic hematuria: Secondary | ICD-10-CM

## 2023-03-06 DIAGNOSIS — R3915 Urgency of urination: Secondary | ICD-10-CM

## 2023-03-06 DIAGNOSIS — R350 Frequency of micturition: Secondary | ICD-10-CM

## 2023-03-06 DIAGNOSIS — Z87898 Personal history of other specified conditions: Secondary | ICD-10-CM

## 2023-03-06 NOTE — Progress Notes (Signed)
   Subjective:    Patient ID: [name], female    DOB: November 21, 1932, 89 y.o.   MRN: [id number]  HPI  89 year old remote smoker referred by cardiology for evaluation of dyspnea for 4 years She smoked about 65 pack years before she quit in 2003.    PMH  chronic diastolic CHF,  Atrial fibrillation is chronic and is rate controlled on Cardizem and metoprolol, also on Eliquis  CAD, LHC 2017 showed single-vessel disease, RCA 80%  hx of NSVT, IDA, hx of syncope,  CKD stage 3  She is maintained on Trelegy without significant benefit  44-month follow-up visit. Arrives in a wheelchair accompanied by her daughter. She continues to complain of shortness of breath.  She had an episode of cellulitis which improved with antibiotics.  She underwent cystoscopy for recurrent UTIs by urology and there was no obstruction. At her initial visit 11/2022 we schedule high-resolution CT chest which was done 11/7, results are not reported yet but to my review shows minimal fibrosis, not clearly UIP-pattern.  Serology was negative She does admit to depressive symptoms Does not want flu shot "I stay indoors"  Significant tests/ events reviewed   08/2014 PFTS Chest CT cuts on abd ct 09/29/20 Mild interstitial fibrosis and mild bronchiectasis in the lung bases.     Review of Systems neg for any significant sore throat, dysphagia, itching, sneezing, nasal congestion or excess/ purulent secretions, fever, chills, sweats, unintended wt loss, pleuritic or exertional cp, hempoptysis, orthopnea pnd or change in chronic leg swelling. Also denies presyncope, palpitations, heartburn, abdominal pain, nausea, vomiting, diarrhea or change in bowel or urinary habits, dysuria,hematuria, rash, arthralgias, visual complaints, headache, numbness weakness or ataxia.     Objective:   Physical Exam  Gen. Pleasant, obese, in no distress ENT - no lesions, no post nasal drip Neck: No JVD, no thyromegaly, no carotid bruits Lungs:  no use of accessory muscles, no dullness to percussion, bibasal dry crackles Cardiovascular: Rhythm regular, heart sounds  normal, no murmurs or gallops, no peripheral edema Musculoskeletal: No deformities, no cyanosis or clubbing , no tremors        Assessment & Plan:

## 2023-03-06 NOTE — Assessment & Plan Note (Signed)
Minimal, not UIP pattern. Not a candidate for antifibrotic's She did not qualify for oxygen on her last visit when we checked. We will enroll her in pulmonary rehab in [place] Hopefully this will help improve her conditioning to where she can get out more with her daughter She refuses flu shot today

## 2023-03-06 NOTE — Patient Instructions (Addendum)
x referral to pulmonary rehab program in [place]  x schedule PFTs at [facility]

## 2023-03-09 ENCOUNTER — Telehealth (HOSPITAL_BASED_OUTPATIENT_CLINIC_OR_DEPARTMENT_OTHER): Payer: Self-pay

## 2023-03-09 NOTE — Telephone Encounter (Signed)
I left a message for the patient to return my call.

## 2023-03-09 NOTE — Telephone Encounter (Signed)
-----   Message from Shiloh V. Alva sent at 03/06/2023  9:27 PM EST ----- We received official radiology report that confirms pulmonary fibrosis this is slightly worsened since 2018 but like we discussed, she would not tolerate antifibrotic therapy.

## 2023-03-19 ENCOUNTER — Encounter (HOSPITAL_COMMUNITY)
Admission: RE | Admit: 2023-03-19 | Discharge: 2023-03-19 | Disposition: A | Discharge status: Home / Self Care | Payer: PPO | Source: Ambulatory Visit | Attending: Pulmonary Disease | Admitting: Pulmonary Disease

## 2023-03-19 ENCOUNTER — Telehealth: Payer: Self-pay

## 2023-03-19 DIAGNOSIS — J849 Interstitial pulmonary disease, unspecified: Secondary | ICD-10-CM | POA: Insufficient documentation

## 2023-03-19 NOTE — Telephone Encounter (Signed)
Patient's  daughter [name] was made aware and voiced understanding.

## 2023-03-19 NOTE — Telephone Encounter (Signed)
-----   Message from Donnita Falls sent at 03/19/2023  6:47 AM EST ----- Please notify patient: CT showed no GU stones, masses, or hydronephrosis; bladder unremarkable. Nothing to explain microscopic hematuria or UTIs.   Incidental findings included bile duct dilation, 2.7 cm abdominal aortic aneurysm, and a small fat-containing periumbilical hernia. All non-acute. Advised to follow up with PCP about those.

## 2023-03-20 ENCOUNTER — Encounter (HOSPITAL_COMMUNITY)
Admission: RE | Admit: 2023-03-20 | Discharge: 2023-03-20 | Disposition: A | Discharge status: Home / Self Care | Payer: PPO | Source: Ambulatory Visit | Attending: Pulmonary Disease | Admitting: Pulmonary Disease

## 2023-03-20 VITALS — Ht 61.0 in | Wt 159.5 lb

## 2023-03-20 DIAGNOSIS — J849 Interstitial pulmonary disease, unspecified: Secondary | ICD-10-CM | POA: Diagnosis not present

## 2023-03-20 NOTE — Patient Instructions (Signed)
Patient Instructions  Patient Details  Name: [name] MRN: [id number] Date of Birth: 1933/02/17 Referring Provider:  Oretha Milch, MD  Below are your personal goals for exercise, nutrition, and risk factors. Our goal is to help you stay on track towards obtaining and maintaining these goals. We will be discussing your progress on these goals with you throughout the program.  Initial Exercise Prescription:  Initial Exercise Prescription - 03/20/23 1400       Date of Initial Exercise RX and Referring Provider   Date 03/20/23    Referring Provider Cyril Mourning MD      Oxygen   Maintain Oxygen Saturation 88% or higher      NuStep   Level 1    SPM 60    Minutes 15    METs 1.5      Arm/Foot Ergometer   Level 1    Watts 10    Minutes 7    METs 1.5      Track   Laps 2    Minutes 7    METs 1.5      Prescription Details   Frequency (times per week) 2    Duration Progress to 30 minutes of continuous aerobic without signs/symptoms of physical distress      Intensity   THRR 40-80% of Max Heartrate 96-120    Ratings of Perceived Exertion 11-13    Perceived Dyspnea 0-4      Progression   Progression Continue to progress workloads to maintain intensity without signs/symptoms of physical distress.      Resistance Training   Training Prescription Yes    Weight 2 lb    Reps 10-15             Exercise Goals: Frequency: Be able to perform aerobic exercise two to three times per week in program working toward 2-5 days per week of home exercise.  Intensity: Work with a perceived exertion of 11 (fairly light) - 15 (hard) while following your exercise prescription.  We will make changes to your prescription with you as you progress through the program.   Duration: Be able to do 30 to 45 minutes of continuous aerobic exercise in addition to a 5 minute warm-up and a 5 minute cool-down routine.   Nutrition Goals: Your personal nutrition goals will be established when you  do your nutrition analysis with the dietician.  The following are general nutrition guidelines to follow: Cholesterol < 200mg /day Sodium < 1500mg /day Fiber: Women over 50 yrs - 21 grams per day  Personal Goals:  Personal Goals and Risk Factors at Admission - 03/20/23 1425       Core Components/Risk Factors/Patient Goals on Admission    Weight Management Yes;Weight Loss;Weight Maintenance    Intervention Weight Management: Develop a combined nutrition and exercise program designed to reach desired caloric intake, while maintaining appropriate intake of nutrient and fiber, sodium and fats, and appropriate energy expenditure required for the weight goal.;Weight Management: Provide education and appropriate resources to help participant work on and attain dietary goals.;Weight Management/Obesity: Establish reasonable short term and long term weight goals.;Obesity: Provide education and appropriate resources to help participant work on and attain dietary goals.    Admit Weight 159 lb 8 oz (72.3 kg)    Goal Weight: Short Term 155 lb (70.3 kg)    Goal Weight: Long Term 155 lb (70.3 kg)    Expected Outcomes Short Term: Continue to assess and modify interventions until short term weight is achieved;Long Term:  Adherence to nutrition and physical activity/exercise program aimed toward attainment of established weight goal;Weight Maintenance: Understanding of the daily nutrition guidelines, which includes 25-35% calories from fat, 7% or less cal from saturated fats, less than 200mg  cholesterol, less than 1.5gm of sodium, & 5 or more servings of fruits and vegetables daily;Weight Loss: Understanding of general recommendations for a balanced deficit meal plan, which promotes 1-2 lb weight loss per week and includes a negative energy balance of [phone] kcal/d;Understanding recommendations for meals to include 15-35% energy as protein, 25-35% energy from fat, 35-60% energy from carbohydrates, less than 200mg  of  dietary cholesterol, 20-35 gm of total fiber daily;Understanding of distribution of calorie intake throughout the day with the consumption of 4-5 meals/snacks    Improve shortness of breath with ADL's Yes    Intervention Provide education, individualized exercise plan and daily activity instruction to help decrease symptoms of SOB with activities of daily living.    Expected Outcomes Short Term: Improve cardiorespiratory fitness to achieve a reduction of symptoms when performing ADLs;Long Term: Be able to perform more ADLs without symptoms or delay the onset of symptoms    Increase knowledge of respiratory medications and ability to use respiratory devices properly  Yes    Intervention Provide education and demonstration as needed of appropriate use of medications, inhalers, and oxygen therapy.    Expected Outcomes Short Term: Achieves understanding of medications use. Understands that oxygen is a medication prescribed by physician. Demonstrates appropriate use of inhaler and oxygen therapy.;Long Term: Maintain appropriate use of medications, inhalers, and oxygen therapy.    Heart Failure Yes    Intervention Provide a combined exercise and nutrition program that is supplemented with education, support and counseling about heart failure. Directed toward relieving symptoms such as shortness of breath, decreased exercise tolerance, and extremity edema.    Expected Outcomes Improve functional capacity of life;Short term: Attendance in program 2-3 days a week with increased exercise capacity. Reported lower sodium intake. Reported increased fruit and vegetable intake. Reports medication compliance.;Short term: Daily weights obtained and reported for increase. Utilizing diuretic protocols set by physician.;Long term: Adoption of self-care skills and reduction of barriers for early signs and symptoms recognition and intervention leading to self-care maintenance.    Hypertension Yes    Intervention Provide  education on lifestyle modifcations including regular physical activity/exercise, weight management, moderate sodium restriction and increased consumption of fresh fruit, vegetables, and low fat dairy, alcohol moderation, and smoking cessation.;Monitor prescription use compliance.    Expected Outcomes Short Term: Continued assessment and intervention until BP is < 140/21mm HG in hypertensive participants. < 130/37mm HG in hypertensive participants with diabetes, heart failure or chronic kidney disease.;Long Term: Maintenance of blood pressure at goal levels.    Lipids Yes    Intervention Provide education and support for participant on nutrition & aerobic/resistive exercise along with prescribed medications to achieve LDL 70mg , HDL >40mg .    Expected Outcomes Short Term: Participant states understanding of desired cholesterol values and is compliant with medications prescribed. Participant is following exercise prescription and nutrition guidelines.;Long Term: Cholesterol controlled with medications as prescribed, with individualized exercise RX and with personalized nutrition plan. Value goals: LDL < 70mg , HDL > 40 mg.             Tobacco Use Initial Evaluation: Social History   Tobacco Use  Smoking Status Former   Current packs/day: 0.00   Average packs/day: 1 pack/day for 64.0 years (64.0 ttl pk-yrs)   Types: Cigarettes   Start date:  12/24/1947   Quit date: 11/17/2001   Years since quitting: 21.3  Smokeless Tobacco Never  Tobacco Comments   Quit smoking in 2003    Exercise Goals and Review:  Exercise Goals     Row Name 03/20/23 1423             Exercise Goals   Increase Physical Activity Yes       Intervention Provide advice, education, support and counseling about physical activity/exercise needs.;Develop an individualized exercise prescription for aerobic and resistive training based on initial evaluation findings, risk stratification, comorbidities and participant's personal  goals.       Expected Outcomes Short Term: Attend rehab on a regular basis to increase amount of physical activity.;Long Term: Add in home exercise to make exercise part of routine and to increase amount of physical activity.;Long Term: Exercising regularly at least 3-5 days a week.       Increase Strength and Stamina Yes       Intervention Provide advice, education, support and counseling about physical activity/exercise needs.;Develop an individualized exercise prescription for aerobic and resistive training based on initial evaluation findings, risk stratification, comorbidities and participant's personal goals.       Expected Outcomes Short Term: Increase workloads from initial exercise prescription for resistance, speed, and METs.;Short Term: Perform resistance training exercises routinely during rehab and add in resistance training at home;Long Term: Improve cardiorespiratory fitness, muscular endurance and strength as measured by increased METs and functional capacity ( )       Able to understand and use rate of perceived exertion (RPE) scale Yes       Intervention Provide education and explanation on how to use RPE scale       Expected Outcomes Short Term: Able to use RPE daily in rehab to express subjective intensity level;Long Term:  Able to use RPE to guide intensity level when exercising independently       Able to understand and use Dyspnea scale Yes       Intervention Provide education and explanation on how to use Dyspnea scale       Expected Outcomes Short Term: Able to use Dyspnea scale daily in rehab to express subjective sense of shortness of breath during exertion;Long Term: Able to use Dyspnea scale to guide intensity level when exercising independently       Knowledge and understanding of Target Heart Rate Range (THRR) Yes       Intervention Provide education and explanation of THRR including how the numbers were predicted and where they are located for reference       Expected  Outcomes Short Term: Able to state/look up THRR;Long Term: Able to use THRR to govern intensity when exercising independently;Short Term: Able to use daily as guideline for intensity in rehab       Able to check pulse independently Yes       Intervention Review the importance of being able to check your own pulse for safety during independent exercise;Provide education and demonstration on how to check pulse in carotid and radial arteries.       Expected Outcomes Short Term: Able to explain why pulse checking is important during independent exercise;Long Term: Able to check pulse independently and accurately       Understanding of Exercise Prescription Yes       Intervention Provide education, explanation, and written materials on patient's individual exercise prescription       Expected Outcomes Short Term: Able to explain program exercise prescription;Long Term: Able to  explain home exercise prescription to exercise independently              Copy of goals given to participant.

## 2023-03-20 NOTE — Progress Notes (Signed)
Pulmonary Individual Treatment Plan  Patient Details  Name: [name] MRN: [id number] Date of Birth: 06/23/1932 Referring Provider:   Flowsheet Row PULMONARY REHAB OTHER RESP ORIENTATION from 03/20/2023 in [facility]  Referring Provider Cyril Mourning MD       Initial Encounter Date:  Flowsheet Row PULMONARY REHAB OTHER RESP ORIENTATION from 03/20/2023 in [facility]  Date 03/20/23       Visit Diagnosis: ILD (interstitial lung disease) (HCC)  Patient's Home Medications on Admission:   Current Outpatient Medications:    acetaminophen (TYLENOL) 500 MG tablet, Take 500 mg by mouth every 6 (six) hours as needed for headache or moderate pain., Disp: , Rfl:    albuterol (VENTOLIN HFA) 108 (90 Base) MCG/ACT inhaler, Inhale 2 puffs into the lungs every 4 (four) hours as needed. (Patient not taking: Reported on 03/19/2023), Disp: , Rfl:    ALPRAZolam (XANAX) 0.5 MG tablet, Take 0.5 mg by mouth daily as needed for anxiety or sleep., Disp: , Rfl:    apixaban (ELIQUIS) 5 MG TABS tablet, Take 1 tablet (5 mg total) by mouth 2 (two) times daily., Disp: 180 tablet, Rfl: 3   ascorbic acid (VITAMIN C) 500 MG tablet, Take 1 tablet (500 mg total) by mouth daily., Disp: 30 tablet, Rfl: 2   diltiazem (CARDIZEM CD) 180 MG 24 hr capsule, Take 1 capsule (180 mg total) by mouth daily before breakfast., Disp: 90 capsule, Rfl: 3   diltiazem (TIAZAC) 120 MG 24 hr capsule, Take 1 capsule (120 mg total) by mouth at bedtime., Disp: 90 capsule, Rfl: 1   DULoxetine (CYMBALTA) 30 MG capsule, Take 30 mg by mouth daily., Disp: , Rfl:    estradiol (ESTRACE) 0.1 MG/GM vaginal cream, Discard plastic applicator. Insert a blueberry size amount (approximately 1 gram) of cream on fingertip inside vagina at bedtime every night for 1 week then 2 nights per week for long term use., Disp: 30 g, Rfl: 3   fluticasone (CUTIVATE) 0.05 % cream, Apply 1 application topically 2 (two) times daily  as needed (skin tears)., Disp: , Rfl:    fosfomycin (MONUROL) 3 g PACK, Take 1 dose. Repeat 3 days later (dose 2). Then repeat another 3 days later (dose 3)., Disp: 9 g, Rfl: 0   furosemide (LASIX) 40 MG tablet, Take 1 tablet (40 mg total) by mouth daily., Disp: 135 tablet, Rfl: 3   hydrocortisone (ANUSOL-HC) 2.5 % rectal cream, Place 1 application rectally 2 (two) times daily., Disp: 30 g, Rfl: 1   isosorbide mononitrate (IMDUR) 30 MG 24 hr tablet, Take 1 tablet (30 mg total) by mouth daily., Disp: 90 tablet, Rfl: 3   magnesium oxide (MAG-OX) 400 (240 Mg) MG tablet, Take 1 tablet by mouth daily., Disp: , Rfl:    metoprolol succinate (TOPROL-XL) 50 MG 24 hr tablet, Take 1 tablet (50 mg total) by mouth 2 (two) times daily. Take with or immediately following a meal., Disp: 180 tablet, Rfl: 3   Multiple Vitamins-Minerals (HAIR/SKIN/NAILS) CAPS, Take 1 tablet by mouth daily., Disp: , Rfl:    Naphazoline HCl (CLEAR EYES OP), Place 1 drop into both eyes daily as needed (itching)., Disp: , Rfl:    nitroGLYCERIN (NITROSTAT) 0.4 MG SL tablet, DISSOLVE ONE TABLET UNDER TONGUE EVERY 5 MINUTES UP TO 3 DOSES AS NEEDED FOR CHEST PAIN., Disp: 25 tablet, Rfl: 6   pantoprazole (PROTONIX) 40 MG tablet, TAKE ONE TABLET BY MOUTH TWICE DAILY BEFORE MEALS, Disp: 60 tablet, Rfl: 5  potassium chloride (KLOR-CON) 10 MEQ tablet, Take 2 tablets (20 mEq total) by mouth 2 (two) times daily., Disp: 120 tablet, Rfl: 6   traMADol (ULTRAM) 50 MG tablet, Take 1 tablet (50 mg total) by mouth every 6 (six) hours as needed., Disp: 90 tablet, Rfl: 3   TRELEGY ELLIPTA 100-62.5-25 MCG/ACT AEPB, Take 1 puff by mouth daily. (Patient not taking: Reported on 03/19/2023), Disp: , Rfl:    triamcinolone cream (KENALOG) 0.1 %, Apply topically 2 (two) times daily., Disp: , Rfl:    zinc sulfate 220 (50 Zn) MG capsule, Take 1 capsule (220 mg total) by mouth daily., Disp: 30 capsule, Rfl: 3  Past Medical History: Past Medical History:  Diagnosis  Date   Anxiety    Arthritis    Atrial fibrillation (HCC)    Bursitis    Left shoulder   Cataract    CHF (congestive heart failure) (HCC)    CKD (chronic kidney disease)    stage 3-4   COPD (chronic obstructive pulmonary disease) (HCC)    Coronary atherosclerosis of native coronary artery    a. s/p DES to LCx in 04/2013 b. cath in 11/2015 showing patent stent with 20% prox-LAD and 80% ostial RCA stenosis for which medical management was recommended due to small artery size   Depression    Diastolic heart failure (HCC)    EF 55-60%   Dysphagia, unspecified(787.20)    Dyspnea    Dysrhythmia    Essential hypertension    GERD (gastroesophageal reflux disease)    Hx Schatzki's ring, multiple EGD/ED last 01/06/2004   Gout    Headache    History of anemia    Hyperlipidemia    Internal hemorrhoids without mention of complication    MI (myocardial infarction) (HCC) 2006   Microscopic colitis 2003   Panic disorder without agoraphobia    Paresthesia    Pneumonia 12/2011   PVD (peripheral vascular disease) (HCC)    S/P colonoscopy 09/27/2001   internal hemorrhoids, desc colon inflam polyp, SB BX-chronic duodenitis, colitis   Sleep apnea    Thyroid disease     Tobacco Use: Social History   Tobacco Use  Smoking Status Former   Current packs/day: 0.00   Average packs/day: 1 pack/day for 64.0 years (64.0 ttl pk-yrs)   Types: Cigarettes   Start date: 12/24/1947   Quit date: 11/17/2001   Years since quitting: 21.3  Smokeless Tobacco Never  Tobacco Comments   Quit smoking in 2003    Labs: Review Flowsheet  More data exists      Latest Ref Rng & Units 11/09/2019 11/10/2019 06/16/2020 09/29/2020 09/30/2020  Labs for ITP Cardiac and Pulmonary Rehab  Cholestrol 0 - 200 mg/dL - 244  - - -  LDL (calc) 0 - 99 mg/dL - 98  - - -  HDL-C >01 mg/dL - 38  - - -  Trlycerides <150 mg/dL - 82  - 84  -  Hemoglobin A1c 4.8 - 5.6 % 7.0  - 7.0  - 7.1     Details            Capillary Blood  Glucose: Lab Results  Component Value Date   GLUCAP 88 11/11/2019   GLUCAP 100 (H) 11/11/2019   GLUCAP 111 (H) 11/10/2019   GLUCAP 136 (H) 11/10/2019   GLUCAP 117 (H) 11/10/2019     Pulmonary Assessment Scores:  Pulmonary Assessment Scores     Row Name 03/20/23 1433         ADL  UCSD   ADL Phase Entry     SOB Score total 91     Rest 1     Walk 4     Stairs 5     Bath 3     Dress 3     Shop 5       CAT Score   CAT Score 34       mMRC Score   mMRC Score 3             UCSD: Self-administered rating of dyspnea associated with activities of daily living (ADLs) 6-point scale (0 = "not at all" to 5 = "maximal or unable to do because of breathlessness")  Scoring Scores range from 0 to 120.  Minimally important difference is 5 units  CAT: CAT can identify the health impairment of COPD patients and is better correlated with disease progression.  CAT has a scoring range of zero to 40. The CAT score is classified into four groups of low (less than 10), medium (10 - 20), high (21-30) and very high (31-40) based on the impact level of disease on health status. A CAT score over 10 suggests significant symptoms.  A worsening CAT score could be explained by an exacerbation, poor medication adherence, poor inhaler technique, or progression of COPD or comorbid conditions.  CAT MCID is 2 points  mMRC: mMRC (Modified Medical Research Council) Dyspnea Scale is used to assess the degree of baseline functional disability in patients of respiratory disease due to dyspnea. No minimal important difference is established. A decrease in score of 1 point or greater is considered a positive change.   Pulmonary Function Assessment:  Pulmonary Function Assessment - 03/20/23 1433       Breath   Shortness of Breath Yes;Fear of Shortness of Breath;Limiting activity;Panic with Shortness of Breath             Exercise Target Goals: Exercise Program Goal: Individual exercise prescription  set using results from initial 6 min walk test and THRR while considering  patient's activity barriers and safety.   Exercise Prescription Goal: Initial exercise prescription builds to 30-45 minutes a day of aerobic activity, 2-3 days per week.  Home exercise guidelines will be given to patient during program as part of exercise prescription that the participant will acknowledge.  Activity Barriers & Risk Stratification:  Activity Barriers & Cardiac Risk Stratification - 03/19/23 1311       Activity Barriers & Cardiac Risk Stratification   Activity Barriers Back Problems;Chest Pain/Angina;Shortness of Breath;Deconditioning;Muscular Weakness;Right Knee Replacement;History of Falls;Assistive Device;Balance Concerns    Cardiac Risk Stratification Moderate             6 Minute Walk:  6 Minute Walk     Row Name 03/20/23 1418         6 Minute Walk   Phase Initial     Distance 440 feet     Walk Time 5.75 minutes     # of Rest Breaks 1  15 sec     MPH 0.87     METS 1.26     RPE 17     Perceived Dyspnea  3     VO2 Peak 4.4     Symptoms Yes (comment)     Comments chronic back pain 9/10, SOB     Resting HR 72 bpm     Resting BP 124/72     Resting Oxygen Saturation  96 %     Exercise Oxygen Saturation  during  6 min walk 80 %     Max Ex. HR 165 bpm     Max Ex. BP 168/84     2 Minute Post BP 162/72       Interval HR   1 Minute HR 165  possible error reading on first two HR but has hx afib     2 Minute HR 159     3 Minute HR 106     4 Minute HR 94     5 Minute HR 95     6 Minute HR 96     2 Minute Post HR 79     Interval Heart Rate? Yes              Oxygen Initial Assessment:  Oxygen Initial Assessment - 03/20/23 1426       Home Oxygen   Home Oxygen Device None    Sleep Oxygen Prescription None    Home Exercise Oxygen Prescription None    Home Resting Oxygen Prescription None      Initial 6 min Walk   Oxygen Used None      Program Oxygen Prescription    Program Oxygen Prescription None      Intervention   Short Term Goals To learn and understand importance of monitoring SPO2 with pulse oximeter and demonstrate accurate use of the pulse oximeter.;To learn and understand importance of maintaining oxygen saturations>88%;To learn and demonstrate proper pursed lip breathing techniques or other breathing techniques. ;To learn and demonstrate proper use of respiratory medications    Long  Term Goals Maintenance of O2 saturations>88%;Compliance with respiratory medication;Verbalizes importance of monitoring SPO2 with pulse oximeter and return demonstration;Exhibits proper breathing techniques, such as pursed lip breathing or other method taught during program session;Demonstrates proper use of MDI's             Oxygen Re-Evaluation:   Oxygen Discharge (Final Oxygen Re-Evaluation):   Initial Exercise Prescription:  Initial Exercise Prescription - 03/20/23 1400       Date of Initial Exercise RX and Referring Provider   Date 03/20/23    Referring Provider Cyril Mourning MD      Oxygen   Maintain Oxygen Saturation 88% or higher      NuStep   Level 1    SPM 60    Minutes 15    METs 1.5      Arm/Foot Ergometer   Level 1    Watts 10    Minutes 7    METs 1.5      Track   Laps 2    Minutes 7    METs 1.5      Prescription Details   Frequency (times per week) 2    Duration Progress to 30 minutes of continuous aerobic without signs/symptoms of physical distress      Intensity   THRR 40-80% of Max Heartrate 96-120    Ratings of Perceived Exertion 11-13    Perceived Dyspnea 0-4      Progression   Progression Continue to progress workloads to maintain intensity without signs/symptoms of physical distress.      Resistance Training   Training Prescription Yes    Weight 2 lb    Reps 10-15             Perform Capillary Blood Glucose checks as needed.  Exercise Prescription Changes:   Exercise Prescription Changes      Row Name 03/20/23 1400  Response to Exercise   Blood Pressure (Admit) 124/72       Blood Pressure (Exercise) 168/84       Blood Pressure (Exit) 134/74       Heart Rate (Admit) 72 bpm       Heart Rate (Exercise) 165 bpm       Heart Rate (Exit) 74 bpm       Oxygen Saturation (Admit) 96 %       Oxygen Saturation (Exercise) 80 %       Oxygen Saturation (Exit) 94 %       Rating of Perceived Exertion (Exercise) 17       Perceived Dyspnea (Exercise) 3       Symptoms back pain 9/10, SOB       Comments walk test results                Exercise Comments:   Exercise Goals and Review:   Exercise Goals     Row Name 03/20/23 1423             Exercise Goals   Increase Physical Activity Yes       Intervention Provide advice, education, support and counseling about physical activity/exercise needs.;Develop an individualized exercise prescription for aerobic and resistive training based on initial evaluation findings, risk stratification, comorbidities and participant's personal goals.       Expected Outcomes Short Term: Attend rehab on a regular basis to increase amount of physical activity.;Long Term: Add in home exercise to make exercise part of routine and to increase amount of physical activity.;Long Term: Exercising regularly at least 3-5 days a week.       Increase Strength and Stamina Yes       Intervention Provide advice, education, support and counseling about physical activity/exercise needs.;Develop an individualized exercise prescription for aerobic and resistive training based on initial evaluation findings, risk stratification, comorbidities and participant's personal goals.       Expected Outcomes Short Term: Increase workloads from initial exercise prescription for resistance, speed, and METs.;Short Term: Perform resistance training exercises routinely during rehab and add in resistance training at home;Long Term: Improve cardiorespiratory fitness, muscular  endurance and strength as measured by increased METs and functional capacity ( )       Able to understand and use rate of perceived exertion (RPE) scale Yes       Intervention Provide education and explanation on how to use RPE scale       Expected Outcomes Short Term: Able to use RPE daily in rehab to express subjective intensity level;Long Term:  Able to use RPE to guide intensity level when exercising independently       Able to understand and use Dyspnea scale Yes       Intervention Provide education and explanation on how to use Dyspnea scale       Expected Outcomes Short Term: Able to use Dyspnea scale daily in rehab to express subjective sense of shortness of breath during exertion;Long Term: Able to use Dyspnea scale to guide intensity level when exercising independently       Knowledge and understanding of Target Heart Rate Range (THRR) Yes       Intervention Provide education and explanation of THRR including how the numbers were predicted and where they are located for reference       Expected Outcomes Short Term: Able to state/look up THRR;Long Term: Able to use THRR to govern intensity when exercising independently;Short Term: Able to use daily as guideline  for intensity in rehab       Able to check pulse independently Yes       Intervention Review the importance of being able to check your own pulse for safety during independent exercise;Provide education and demonstration on how to check pulse in carotid and radial arteries.       Expected Outcomes Short Term: Able to explain why pulse checking is important during independent exercise;Long Term: Able to check pulse independently and accurately       Understanding of Exercise Prescription Yes       Intervention Provide education, explanation, and written materials on patient's individual exercise prescription       Expected Outcomes Short Term: Able to explain program exercise prescription;Long Term: Able to explain home exercise  prescription to exercise independently                Exercise Goals Re-Evaluation :   Discharge Exercise Prescription (Final Exercise Prescription Changes):  Exercise Prescription Changes - 03/20/23 1400       Response to Exercise   Blood Pressure (Admit) 124/72    Blood Pressure (Exercise) 168/84    Blood Pressure (Exit) 134/74    Heart Rate (Admit) 72 bpm    Heart Rate (Exercise) 165 bpm    Heart Rate (Exit) 74 bpm    Oxygen Saturation (Admit) 96 %    Oxygen Saturation (Exercise) 80 %    Oxygen Saturation (Exit) 94 %    Rating of Perceived Exertion (Exercise) 17    Perceived Dyspnea (Exercise) 3    Symptoms back pain 9/10, SOB    Comments walk test results             Nutrition:  Target Goals: Understanding of nutrition guidelines, daily intake of sodium 1500mg , cholesterol 200mg , calories 30% from fat and 7% or less from saturated fats, daily to have 5 or more servings of fruits and vegetables.  Biometrics:  Pre Biometrics - 03/20/23 1424       Pre Biometrics   Height 5\' 1"  (1.549 m)    Weight 72.3 kg    Waist Circumference 38 inches    Hip Circumference 43 inches    Waist to Hip Ratio 0.88 %    BMI (Calculated) 30.15    Grip Strength 13.9 kg    Single Leg Stand 2.7 seconds              Nutrition Therapy Plan and Nutrition Goals:  Nutrition Therapy & Goals - 03/20/23 1425       Intervention Plan   Intervention Prescribe, educate and counsel regarding individualized specific dietary modifications aiming towards targeted core components such as weight, hypertension, lipid management, diabetes, heart failure and other comorbidities.;Nutrition handout(s) given to patient.    Expected Outcomes Short Term Goal: Understand basic principles of dietary content, such as calories, fat, sodium, cholesterol and nutrients.;Long Term Goal: Adherence to prescribed nutrition plan.             Nutrition Assessments:  MEDIFICTS Score Key: >=70 Need to  make dietary changes  40-70 Heart Healthy Diet <= 40 Therapeutic Level Cholesterol Diet  Flowsheet Row PULMONARY REHAB OTHER RESP ORIENTATION from 03/20/2023 in [facility]  Picture Your Plate Total Score on Admission 47      Picture Your Plate Scores: <13 Unhealthy dietary pattern with much room for improvement. 41-50 Dietary pattern unlikely to meet recommendations for good health and room for improvement. 51-60 More healthful dietary pattern, with some room for  improvement.  >60 Healthy dietary pattern, although there may be some specific behaviors that could be improved.    Nutrition Goals Re-Evaluation:   Nutrition Goals Discharge (Final Nutrition Goals Re-Evaluation):   Psychosocial: Target Goals: Acknowledge presence or absence of significant depression and/or stress, maximize coping skills, provide positive support system. Participant is able to verbalize types and ability to use techniques and skills needed for reducing stress and depression.  Initial Review & Psychosocial Screening:  Initial Psych Review & Screening - 03/19/23 1342       Initial Review   Current issues with History of Depression;Current Psychotropic Meds;Current Sleep Concerns;Current Stress Concerns    Source of Stress Concerns Chronic Illness;Family      Family Dynamics   Good Support System? Yes      Barriers   Psychosocial barriers to participate in program The patient should benefit from training in stress management and relaxation.      Screening Interventions   Interventions To provide support and resources with identified psychosocial needs;Encouraged to exercise;Provide feedback about the scores to participant    Expected Outcomes Short Term goal: Utilizing psychosocial counselor, staff and physician to assist with identification of specific Stressors or current issues interfering with healing process. Setting desired goal for each stressor or current issue  identified.;Long Term Goal: Stressors or current issues are controlled or eliminated.;Short Term goal: Identification and review with participant of any Quality of Life or Depression concerns found by scoring the questionnaire.;Long Term goal: The participant improves quality of Life and PHQ9 Scores as seen by post scores and/or verbalization of changes             Quality of Life Scores:  Scores of 19 and below usually indicate a poorer quality of life in these areas.  A difference of  2-3 points is a clinically meaningful difference.  A difference of 2-3 points in the total score of the Quality of Life Index has been associated with significant improvement in overall quality of life, self-image, physical symptoms, and general health in studies assessing change in quality of life.   PHQ-9: Review Flowsheet       03/20/2023 03/24/2016  Depression screen PHQ 2/9  Decreased Interest 1 0  Down, Depressed, Hopeless 3 1  PHQ - 2 Score 4 1  Altered sleeping 3 -  Tired, decreased energy 3 -  Change in appetite 2 -  Feeling bad or failure about yourself  2 -  Trouble concentrating 0 -  Moving slowly or fidgety/restless 1 -  Suicidal thoughts 0 -  PHQ-9 Score 15 -  Difficult doing work/chores Extremely dIfficult -    Details           Interpretation of Total Score  Total Score Depression Severity:  1-4 = Minimal depression, 5-9 = Mild depression, 10-14 = Moderate depression, 15-19 = Moderately severe depression, 20-27 = Severe depression   Psychosocial Evaluation and Intervention:  Psychosocial Evaluation - 03/19/23 1343       Psychosocial Evaluation & Interventions   Interventions Stress management education;Relaxation education;Encouraged to exercise with the program and follow exercise prescription    Comments Patient was referred to [facility] with ILD by Dr. Vassie Loll. She and her daughter both answered questions. Patient's son and grandson live with her but her 2 daughter's are her main  support. She has a history of panic attacks and depression both treated with cymbalta. She says she does get depressed at times due to having to stay home alot with her chronic  health issues. She had back surgery in 2017 and due to osteoporosis some of the pins and screws are getitng lose and this is causing chronic back pain. She also reports bursitis in her left shoulder. She says she is not sure if she will be able to exercise but would like to try and see what she is able to do. She has trouble both going and staying asleep. She takes Alprazolam prn for sleep which helps some. Her major stressors are her son that lives with her has liver cancer diagnosed last year and 3 years ago, her son passed away April 11, 2023 and she says she has a difficult time with increased depression during the month of December. Her goals for the program are to get stronger, increase her endurance, be more independent and improve her QOL. She is motivated to try the program but is aware that her chronic pain may be a barrier for her to complete the program.    Expected Outcomes Short Term: start the program and attend consistently. Long term: meet her personal goals.    Continue Psychosocial Services  Follow up required by staff             Psychosocial Re-Evaluation:   Psychosocial Discharge (Final Psychosocial Re-Evaluation):    Education: Education Goals: Education classes will be provided on a weekly basis, covering required topics. Participant will state understanding/return demonstration of topics presented.  Learning Barriers/Preferences:  Learning Barriers/Preferences - 03/19/23 1339       Learning Barriers/Preferences   Learning Barriers None    Learning Preferences Audio;Written Material;Skilled Demonstration             Education Topics: How Lungs Work and Diseases: - Discuss the anatomy of the lungs and diseases that can affect the lungs, such as COPD.   Exercise: -Discuss the importance of  exercise, FITT principles of exercise, normal and abnormal responses to exercise, and how to exercise safely.   Environmental Irritants: -Discuss types of environmental irritants and how to limit exposure to environmental irritants.   Meds/Inhalers and oxygen: - Discuss respiratory medications, definition of an inhaler and oxygen, and the proper way to use an inhaler and oxygen.   Energy Saving Techniques: - Discuss methods to conserve energy and decrease shortness of breath when performing activities of daily living.    Bronchial Hygiene / Breathing Techniques: - Discuss breathing mechanics, pursed-lip breathing technique,  proper posture, effective ways to clear airways, and other functional breathing techniques   Cleaning Equipment: - Provides group verbal and written instruction about the health risks of elevated stress, cause of high stress, and healthy ways to reduce stress.   Nutrition I: Fats: - Discuss the types of cholesterol, what cholesterol does to the body, and how cholesterol levels can be controlled.   Nutrition II: Labels: -Discuss the different components of food labels and how to read food labels.   Respiratory Infections: - Discuss the signs and symptoms of respiratory infections, ways to prevent respiratory infections, and the importance of seeking medical treatment when having a respiratory infection.   Stress I: Signs and Symptoms: - Discuss the causes of stress, how stress may lead to anxiety and depression, and ways to limit stress.   Stress II: Relaxation: -Discuss relaxation techniques to limit stress.   Oxygen for Home/Travel: - Discuss how to prepare for travel when on oxygen and proper ways to transport and store oxygen to ensure safety.   Knowledge Questionnaire Score:  Knowledge Questionnaire Score - 03/20/23 1425  Knowledge Questionnaire Score   Pre Score 16/18   with daughter            Core Components/Risk  Factors/Patient Goals at Admission:  Personal Goals and Risk Factors at Admission - 03/20/23 1425       Core Components/Risk Factors/Patient Goals on Admission    Weight Management Yes;Weight Loss;Weight Maintenance    Intervention Weight Management: Develop a combined nutrition and exercise program designed to reach desired caloric intake, while maintaining appropriate intake of nutrient and fiber, sodium and fats, and appropriate energy expenditure required for the weight goal.;Weight Management: Provide education and appropriate resources to help participant work on and attain dietary goals.;Weight Management/Obesity: Establish reasonable short term and long term weight goals.;Obesity: Provide education and appropriate resources to help participant work on and attain dietary goals.    Admit Weight 159 lb 8 oz (72.3 kg)    Goal Weight: Short Term 155 lb (70.3 kg)    Goal Weight: Long Term 155 lb (70.3 kg)    Expected Outcomes Short Term: Continue to assess and modify interventions until short term weight is achieved;Long Term: Adherence to nutrition and physical activity/exercise program aimed toward attainment of established weight goal;Weight Maintenance: Understanding of the daily nutrition guidelines, which includes 25-35% calories from fat, 7% or less cal from saturated fats, less than 200mg  cholesterol, less than 1.5gm of sodium, & 5 or more servings of fruits and vegetables daily;Weight Loss: Understanding of general recommendations for a balanced deficit meal plan, which promotes 1-2 lb weight loss per week and includes a negative energy balance of [phone] kcal/d;Understanding recommendations for meals to include 15-35% energy as protein, 25-35% energy from fat, 35-60% energy from carbohydrates, less than 200mg  of dietary cholesterol, 20-35 gm of total fiber daily;Understanding of distribution of calorie intake throughout the day with the consumption of 4-5 meals/snacks    Improve shortness of  breath with ADL's Yes    Intervention Provide education, individualized exercise plan and daily activity instruction to help decrease symptoms of SOB with activities of daily living.    Expected Outcomes Short Term: Improve cardiorespiratory fitness to achieve a reduction of symptoms when performing ADLs;Long Term: Be able to perform more ADLs without symptoms or delay the onset of symptoms    Increase knowledge of respiratory medications and ability to use respiratory devices properly  Yes    Intervention Provide education and demonstration as needed of appropriate use of medications, inhalers, and oxygen therapy.    Expected Outcomes Short Term: Achieves understanding of medications use. Understands that oxygen is a medication prescribed by physician. Demonstrates appropriate use of inhaler and oxygen therapy.;Long Term: Maintain appropriate use of medications, inhalers, and oxygen therapy.    Heart Failure Yes    Intervention Provide a combined exercise and nutrition program that is supplemented with education, support and counseling about heart failure. Directed toward relieving symptoms such as shortness of breath, decreased exercise tolerance, and extremity edema.    Expected Outcomes Improve functional capacity of life;Short term: Attendance in program 2-3 days a week with increased exercise capacity. Reported lower sodium intake. Reported increased fruit and vegetable intake. Reports medication compliance.;Short term: Daily weights obtained and reported for increase. Utilizing diuretic protocols set by physician.;Long term: Adoption of self-care skills and reduction of barriers for early signs and symptoms recognition and intervention leading to self-care maintenance.    Hypertension Yes    Intervention Provide education on lifestyle modifcations including regular physical activity/exercise, weight management, moderate sodium restriction and increased consumption of  fresh fruit, vegetables, and low  fat dairy, alcohol moderation, and smoking cessation.;Monitor prescription use compliance.    Expected Outcomes Short Term: Continued assessment and intervention until BP is < 140/56mm HG in hypertensive participants. < 130/53mm HG in hypertensive participants with diabetes, heart failure or chronic kidney disease.;Long Term: Maintenance of blood pressure at goal levels.    Lipids Yes    Intervention Provide education and support for participant on nutrition & aerobic/resistive exercise along with prescribed medications to achieve LDL 70mg , HDL >40mg .    Expected Outcomes Short Term: Participant states understanding of desired cholesterol values and is compliant with medications prescribed. Participant is following exercise prescription and nutrition guidelines.;Long Term: Cholesterol controlled with medications as prescribed, with individualized exercise RX and with personalized nutrition plan. Value goals: LDL < 70mg , HDL > 40 mg.             Core Components/Risk Factors/Patient Goals Review:    Core Components/Risk Factors/Patient Goals at Discharge (Final Review):    ITP Comments:  ITP Comments     Row Name 03/20/23 1414           ITP Comments Patient attend orientation today.  Patient is attending [facility] Rehabilitation Program.  Documentation for diagnosis can be found in [facility] 03/06/23 with Dr. Vassie Loll.  Reviewed medical chart, RPE/RPD, gym safety, and program guidelines.  Patient was fitted to equipment they will be using during rehab.  Patient is scheduled to start exercise on Tuesday 03/27/23 at 1500.   Initial ITP created and sent for review and signature by Dr. Erick Blinks, Medical Director for Pulmonary Rehabilitation Program.                Comments: Initial ITP

## 2023-03-21 ENCOUNTER — Encounter (HOSPITAL_COMMUNITY): Payer: Self-pay | Admitting: *Deleted

## 2023-03-21 DIAGNOSIS — J849 Interstitial pulmonary disease, unspecified: Secondary | ICD-10-CM

## 2023-03-21 NOTE — Progress Notes (Signed)
Pulmonary Individual Treatment Plan  Patient Details  Name: [name] MRN: [id number] Date of Birth: [birth year] Referring Provider:   Flowsheet Row PULMONARY REHAB OTHER RESP ORIENTATION from 03/20/2023 in [facility] CARDIAC REHABILITATION  Referring Provider Cyril Mourning MD       Initial Encounter Date:  Flowsheet Row PULMONARY REHAB OTHER RESP ORIENTATION from 03/20/2023 in [place] CARDIAC REHABILITATION  Date 03/20/23       Visit Diagnosis: ILD (interstitial lung disease) (HCC)  Patient's Home Medications on Admission:   Current Outpatient Medications:    acetaminophen (TYLENOL) 500 MG tablet, Take 500 mg by mouth every 6 (six) hours as needed for headache or moderate pain., Disp: , Rfl:    albuterol (VENTOLIN HFA) 108 (90 Base) MCG/ACT inhaler, Inhale 2 puffs into the lungs every 4 (four) hours as needed. (Patient not taking: Reported on 03/19/2023), Disp: , Rfl:    ALPRAZolam (XANAX) 0.5 MG tablet, Take 0.5 mg by mouth daily as needed for anxiety or sleep., Disp: , Rfl:    apixaban (ELIQUIS) 5 MG TABS tablet, Take 1 tablet (5 mg total) by mouth 2 (two) times daily., Disp: 180 tablet, Rfl: 3   ascorbic acid (VITAMIN C) 500 MG tablet, Take 1 tablet (500 mg total) by mouth daily., Disp: 30 tablet, Rfl: 2   diltiazem (CARDIZEM CD) 180 MG 24 hr capsule, Take 1 capsule (180 mg total) by mouth daily before breakfast., Disp: 90 capsule, Rfl: 3   diltiazem (TIAZAC) 120 MG 24 hr capsule, Take 1 capsule (120 mg total) by mouth at bedtime., Disp: 90 capsule, Rfl: 1   DULoxetine (CYMBALTA) 30 MG capsule, Take 30 mg by mouth daily., Disp: , Rfl:    estradiol (ESTRACE) 0.1 MG/GM vaginal cream, Discard plastic applicator. Insert a blueberry size amount (approximately 1 gram) of cream on fingertip inside vagina at bedtime every night for 1 week then 2 nights per week for long term use., Disp: 30 g, Rfl: 3   fluticasone (CUTIVATE) 0.05 % cream, Apply 1 application topically 2 (two) times daily  as needed (skin tears)., Disp: , Rfl:    fosfomycin (MONUROL) 3 g PACK, Take 1 dose. Repeat 3 days later (dose 2). Then repeat another 3 days later (dose 3)., Disp: 9 g, Rfl: 0   furosemide (LASIX) 40 MG tablet, Take 1 tablet (40 mg total) by mouth daily., Disp: 135 tablet, Rfl: 3   hydrocortisone (ANUSOL-HC) 2.5 % rectal cream, Place 1 application rectally 2 (two) times daily., Disp: 30 g, Rfl: 1   isosorbide mononitrate (IMDUR) 30 MG 24 hr tablet, Take 1 tablet (30 mg total) by mouth daily., Disp: 90 tablet, Rfl: 3   magnesium oxide (MAG-OX) 400 (240 Mg) MG tablet, Take 1 tablet by mouth daily., Disp: , Rfl:    metoprolol succinate (TOPROL-XL) 50 MG 24 hr tablet, Take 1 tablet (50 mg total) by mouth 2 (two) times daily. Take with or immediately following a meal., Disp: 180 tablet, Rfl: 3   Multiple Vitamins-Minerals (HAIR/SKIN/NAILS) CAPS, Take 1 tablet by mouth daily., Disp: , Rfl:    Naphazoline HCl (CLEAR EYES OP), Place 1 drop into both eyes daily as needed (itching)., Disp: , Rfl:    nitroGLYCERIN (NITROSTAT) 0.4 MG SL tablet, DISSOLVE ONE TABLET UNDER TONGUE EVERY 5 MINUTES UP TO 3 DOSES AS NEEDED FOR CHEST PAIN., Disp: 25 tablet, Rfl: 6   pantoprazole (PROTONIX) 40 MG tablet, TAKE ONE TABLET BY MOUTH TWICE DAILY BEFORE MEALS, Disp: 60 tablet, Rfl: 5  potassium chloride (KLOR-CON) 10 MEQ tablet, Take 2 tablets (20 mEq total) by mouth 2 (two) times daily., Disp: 120 tablet, Rfl: 6   traMADol (ULTRAM) 50 MG tablet, Take 1 tablet (50 mg total) by mouth every 6 (six) hours as needed., Disp: 90 tablet, Rfl: 3   TRELEGY ELLIPTA 100-62.5-25 MCG/ACT AEPB, Take 1 puff by mouth daily. (Patient not taking: Reported on 03/19/2023), Disp: , Rfl:    triamcinolone cream (KENALOG) 0.1 %, Apply topically 2 (two) times daily., Disp: , Rfl:    zinc sulfate 220 (50 Zn) MG capsule, Take 1 capsule (220 mg total) by mouth daily., Disp: 30 capsule, Rfl: 3  Past Medical History: Past Medical History:  Diagnosis  Date   Anxiety    Arthritis    Atrial fibrillation (HCC)    Bursitis    Left shoulder   Cataract    CHF (congestive heart failure) (HCC)    CKD (chronic kidney disease)    stage 3-4   COPD (chronic obstructive pulmonary disease) (HCC)    Coronary atherosclerosis of native coronary artery    a. s/p DES to LCx in 04/2013 b. cath in 11/2015 showing patent stent with 20% prox-LAD and 80% ostial RCA stenosis for which medical management was recommended due to small artery size   Depression    Diastolic heart failure (HCC)    EF 55-60%   Dysphagia, unspecified(787.20)    Dyspnea    Dysrhythmia    Essential hypertension    GERD (gastroesophageal reflux disease)    Hx Schatzki's ring, multiple EGD/ED last 01/06/2004   Gout    Headache    History of anemia    Hyperlipidemia    Internal hemorrhoids without mention of complication    MI (myocardial infarction) (HCC) 2006   Microscopic colitis 2003   Panic disorder without agoraphobia    Paresthesia    Pneumonia 12/2011   PVD (peripheral vascular disease) (HCC)    S/P colonoscopy 09/27/2001   internal hemorrhoids, desc colon inflam polyp, SB BX-chronic duodenitis, colitis   Sleep apnea    Thyroid disease     Tobacco Use: Social History   Tobacco Use  Smoking Status Former   Current packs/day: 0.00   Average packs/day: 1 pack/day for 64.0 years (64.0 ttl pk-yrs)   Types: Cigarettes   Start date: 12/24/1947   Quit date: 11/17/2001   Years since quitting: 21.3  Smokeless Tobacco Never  Tobacco Comments   Quit smoking in 2003    Labs: Review Flowsheet  More data exists      Latest Ref Rng & Units 11/09/2019 11/10/2019 06/16/2020 09/29/2020 09/30/2020  Labs for ITP Cardiac and Pulmonary Rehab  Cholestrol 0 - 200 mg/dL - 469  - - -  LDL (calc) 0 - 99 mg/dL - 98  - - -  HDL-C >62 mg/dL - 38  - - -  Trlycerides <150 mg/dL - 82  - 84  -  Hemoglobin A1c 4.8 - 5.6 % 7.0  - 7.0  - 7.1     Details            Capillary Blood  Glucose: Lab Results  Component Value Date   GLUCAP 88 11/11/2019   GLUCAP 100 (H) 11/11/2019   GLUCAP 111 (H) 11/10/2019   GLUCAP 136 (H) 11/10/2019   GLUCAP 117 (H) 11/10/2019     Pulmonary Assessment Scores:  Pulmonary Assessment Scores     Row Name 03/20/23 1433         ADL  UCSD   ADL Phase Entry     SOB Score total 91     Rest 1     Walk 4     Stairs 5     Bath 3     Dress 3     Shop 5       CAT Score   CAT Score 34       mMRC Score   mMRC Score 3             UCSD: Self-administered rating of dyspnea associated with activities of daily living (ADLs) 6-point scale (0 = "not at all" to 5 = "maximal or unable to do because of breathlessness")  Scoring Scores range from 0 to 120.  Minimally important difference is 5 units  CAT: CAT can identify the health impairment of COPD patients and is better correlated with disease progression.  CAT has a scoring range of zero to 40. The CAT score is classified into four groups of low (less than 10), medium (10 - 20), high (21-30) and very high (31-40) based on the impact level of disease on health status. A CAT score over 10 suggests significant symptoms.  A worsening CAT score could be explained by an exacerbation, poor medication adherence, poor inhaler technique, or progression of COPD or comorbid conditions.  CAT MCID is 2 points  mMRC: mMRC (Modified Medical Research Council) Dyspnea Scale is used to assess the degree of baseline functional disability in patients of respiratory disease due to dyspnea. No minimal important difference is established. A decrease in score of 1 point or greater is considered a positive change.   Pulmonary Function Assessment:  Pulmonary Function Assessment - 03/20/23 1433       Breath   Shortness of Breath Yes;Fear of Shortness of Breath;Limiting activity;Panic with Shortness of Breath             Exercise Target Goals: Exercise Program Goal: Individual exercise prescription  set using results from initial 6 min walk test and THRR while considering  patient's activity barriers and safety.   Exercise Prescription Goal: Initial exercise prescription builds to 30-45 minutes a day of aerobic activity, 2-3 days per week.  Home exercise guidelines will be given to patient during program as part of exercise prescription that the participant will acknowledge.  Activity Barriers & Risk Stratification:  Activity Barriers & Cardiac Risk Stratification - 03/19/23 1311       Activity Barriers & Cardiac Risk Stratification   Activity Barriers Back Problems;Chest Pain/Angina;Shortness of Breath;Deconditioning;Muscular Weakness;Right Knee Replacement;History of Falls;Assistive Device;Balance Concerns    Cardiac Risk Stratification Moderate             6 Minute Walk:  6 Minute Walk     Row Name 03/20/23 1418         6 Minute Walk   Phase Initial     Distance 440 feet     Walk Time 5.75 minutes     # of Rest Breaks 1  15 sec     MPH 0.87     METS 1.26     RPE 17     Perceived Dyspnea  3     VO2 Peak 4.4     Symptoms Yes (comment)     Comments chronic back pain 9/10, SOB     Resting HR 72 bpm     Resting BP 124/72     Resting Oxygen Saturation  96 %     Exercise Oxygen Saturation  during  6 min walk 80 %     Max Ex. HR 165 bpm     Max Ex. BP 168/84     2 Minute Post BP 162/72       Interval HR   1 Minute HR 165  possible error reading on first two HR but has hx afib     2 Minute HR 159     3 Minute HR 106     4 Minute HR 94     5 Minute HR 95     6 Minute HR 96     2 Minute Post HR 79     Interval Heart Rate? Yes              Oxygen Initial Assessment:  Oxygen Initial Assessment - 03/20/23 1426       Home Oxygen   Home Oxygen Device None    Sleep Oxygen Prescription None    Home Exercise Oxygen Prescription None    Home Resting Oxygen Prescription None      Initial 6 min Walk   Oxygen Used None      Program Oxygen Prescription    Program Oxygen Prescription None      Intervention   Short Term Goals To learn and understand importance of monitoring SPO2 with pulse oximeter and demonstrate accurate use of the pulse oximeter.;To learn and understand importance of maintaining oxygen saturations>88%;To learn and demonstrate proper pursed lip breathing techniques or other breathing techniques. ;To learn and demonstrate proper use of respiratory medications    Long  Term Goals Maintenance of O2 saturations>88%;Compliance with respiratory medication;Verbalizes importance of monitoring SPO2 with pulse oximeter and return demonstration;Exhibits proper breathing techniques, such as pursed lip breathing or other method taught during program session;Demonstrates proper use of MDI's             Oxygen Re-Evaluation:   Oxygen Discharge (Final Oxygen Re-Evaluation):   Initial Exercise Prescription:  Initial Exercise Prescription - 03/20/23 1400       Date of Initial Exercise RX and Referring Provider   Date 03/20/23    Referring Provider Cyril Mourning MD      Oxygen   Maintain Oxygen Saturation 88% or higher      NuStep   Level 1    SPM 60    Minutes 15    METs 1.5      Arm/Foot Ergometer   Level 1    Watts 10    Minutes 7    METs 1.5      Track   Laps 2    Minutes 7    METs 1.5      Prescription Details   Frequency (times per week) 2    Duration Progress to 30 minutes of continuous aerobic without signs/symptoms of physical distress      Intensity   THRR 40-80% of Max Heartrate 96-120    Ratings of Perceived Exertion 11-13    Perceived Dyspnea 0-4      Progression   Progression Continue to progress workloads to maintain intensity without signs/symptoms of physical distress.      Resistance Training   Training Prescription Yes    Weight 2 lb    Reps 10-15             Perform Capillary Blood Glucose checks as needed.  Exercise Prescription Changes:   Exercise Prescription Changes      Row Name 03/20/23 1400  Response to Exercise   Blood Pressure (Admit) 124/72       Blood Pressure (Exercise) 168/84       Blood Pressure (Exit) 134/74       Heart Rate (Admit) 72 bpm       Heart Rate (Exercise) 165 bpm       Heart Rate (Exit) 74 bpm       Oxygen Saturation (Admit) 96 %       Oxygen Saturation (Exercise) 80 %       Oxygen Saturation (Exit) 94 %       Rating of Perceived Exertion (Exercise) 17       Perceived Dyspnea (Exercise) 3       Symptoms back pain 9/10, SOB       Comments walk test results                Exercise Comments:   Exercise Goals and Review:   Exercise Goals     Row Name 03/20/23 1423             Exercise Goals   Increase Physical Activity Yes       Intervention Provide advice, education, support and counseling about physical activity/exercise needs.;Develop an individualized exercise prescription for aerobic and resistive training based on initial evaluation findings, risk stratification, comorbidities and participant's personal goals.       Expected Outcomes Short Term: Attend rehab on a regular basis to increase amount of physical activity.;Long Term: Add in home exercise to make exercise part of routine and to increase amount of physical activity.;Long Term: Exercising regularly at least 3-5 days a week.       Increase Strength and Stamina Yes       Intervention Provide advice, education, support and counseling about physical activity/exercise needs.;Develop an individualized exercise prescription for aerobic and resistive training based on initial evaluation findings, risk stratification, comorbidities and participant's personal goals.       Expected Outcomes Short Term: Increase workloads from initial exercise prescription for resistance, speed, and METs.;Short Term: Perform resistance training exercises routinely during rehab and add in resistance training at home;Long Term: Improve cardiorespiratory fitness, muscular  endurance and strength as measured by increased METs and functional capacity ( )       Able to understand and use rate of perceived exertion (RPE) scale Yes       Intervention Provide education and explanation on how to use RPE scale       Expected Outcomes Short Term: Able to use RPE daily in rehab to express subjective intensity level;Long Term:  Able to use RPE to guide intensity level when exercising independently       Able to understand and use Dyspnea scale Yes       Intervention Provide education and explanation on how to use Dyspnea scale       Expected Outcomes Short Term: Able to use Dyspnea scale daily in rehab to express subjective sense of shortness of breath during exertion;Long Term: Able to use Dyspnea scale to guide intensity level when exercising independently       Knowledge and understanding of Target Heart Rate Range (THRR) Yes       Intervention Provide education and explanation of THRR including how the numbers were predicted and where they are located for reference       Expected Outcomes Short Term: Able to state/look up THRR;Long Term: Able to use THRR to govern intensity when exercising independently;Short Term: Able to use daily as guideline  for intensity in rehab       Able to check pulse independently Yes       Intervention Review the importance of being able to check your own pulse for safety during independent exercise;Provide education and demonstration on how to check pulse in carotid and radial arteries.       Expected Outcomes Short Term: Able to explain why pulse checking is important during independent exercise;Long Term: Able to check pulse independently and accurately       Understanding of Exercise Prescription Yes       Intervention Provide education, explanation, and written materials on patient's individual exercise prescription       Expected Outcomes Short Term: Able to explain program exercise prescription;Long Term: Able to explain home exercise  prescription to exercise independently                Exercise Goals Re-Evaluation :   Discharge Exercise Prescription (Final Exercise Prescription Changes):  Exercise Prescription Changes - 03/20/23 1400       Response to Exercise   Blood Pressure (Admit) 124/72    Blood Pressure (Exercise) 168/84    Blood Pressure (Exit) 134/74    Heart Rate (Admit) 72 bpm    Heart Rate (Exercise) 165 bpm    Heart Rate (Exit) 74 bpm    Oxygen Saturation (Admit) 96 %    Oxygen Saturation (Exercise) 80 %    Oxygen Saturation (Exit) 94 %    Rating of Perceived Exertion (Exercise) 17    Perceived Dyspnea (Exercise) 3    Symptoms back pain 9/10, SOB    Comments walk test results             Nutrition:  Target Goals: Understanding of nutrition guidelines, daily intake of sodium 1500mg , cholesterol 200mg , calories 30% from fat and 7% or less from saturated fats, daily to have 5 or more servings of fruits and vegetables.  Biometrics:  Pre Biometrics - 03/20/23 1424       Pre Biometrics   Height 5\' 1"  (1.549 m)    Weight 159 lb 8 oz (72.3 kg)    Waist Circumference 38 inches    Hip Circumference 43 inches    Waist to Hip Ratio 0.88 %    BMI (Calculated) 30.15    Grip Strength 13.9 kg    Single Leg Stand 2.7 seconds              Nutrition Therapy Plan and Nutrition Goals:  Nutrition Therapy & Goals - 03/20/23 1425       Intervention Plan   Intervention Prescribe, educate and counsel regarding individualized specific dietary modifications aiming towards targeted core components such as weight, hypertension, lipid management, diabetes, heart failure and other comorbidities.;Nutrition handout(s) given to patient.    Expected Outcomes Short Term Goal: Understand basic principles of dietary content, such as calories, fat, sodium, cholesterol and nutrients.;Long Term Goal: Adherence to prescribed nutrition plan.             Nutrition Assessments:  MEDIFICTS Score  Key: >=70 Need to make dietary changes  40-70 Heart Healthy Diet <= 40 Therapeutic Level Cholesterol Diet  Flowsheet Row PULMONARY REHAB OTHER RESP ORIENTATION from 03/20/2023 in [facility] CARDIAC REHABILITATION  Picture Your Plate Total Score on Admission 47      Picture Your Plate Scores: <78 Unhealthy dietary pattern with much room for improvement. 41-50 Dietary pattern unlikely to meet recommendations for good health and room for improvement. 51-60 More healthful dietary pattern,  with some room for improvement.  >60 Healthy dietary pattern, although there may be some specific behaviors that could be improved.    Nutrition Goals Re-Evaluation:   Nutrition Goals Discharge (Final Nutrition Goals Re-Evaluation):   Psychosocial: Target Goals: Acknowledge presence or absence of significant depression and/or stress, maximize coping skills, provide positive support system. Participant is able to verbalize types and ability to use techniques and skills needed for reducing stress and depression.  Initial Review & Psychosocial Screening:  Initial Psych Review & Screening - 03/19/23 1342       Initial Review   Current issues with History of Depression;Current Psychotropic Meds;Current Sleep Concerns;Current Stress Concerns    Source of Stress Concerns Chronic Illness;Family      Family Dynamics   Good Support System? Yes      Barriers   Psychosocial barriers to participate in program The patient should benefit from training in stress management and relaxation.      Screening Interventions   Interventions To provide support and resources with identified psychosocial needs;Encouraged to exercise;Provide feedback about the scores to participant    Expected Outcomes Short Term goal: Utilizing psychosocial counselor, staff and physician to assist with identification of specific Stressors or current issues interfering with healing process. Setting desired goal for each stressor or current  issue identified.;Long Term Goal: Stressors or current issues are controlled or eliminated.;Short Term goal: Identification and review with participant of any Quality of Life or Depression concerns found by scoring the questionnaire.;Long Term goal: The participant improves quality of Life and PHQ9 Scores as seen by post scores and/or verbalization of changes             Quality of Life Scores:  Scores of 19 and below usually indicate a poorer quality of life in these areas.  A difference of  2-3 points is a clinically meaningful difference.  A difference of 2-3 points in the total score of the Quality of Life Index has been associated with significant improvement in overall quality of life, self-image, physical symptoms, and general health in studies assessing change in quality of life.   PHQ-9: Review Flowsheet       03/20/2023 03/24/2016  Depression screen PHQ 2/9  Decreased Interest 1 0  Down, Depressed, Hopeless 3 1  PHQ - 2 Score 4 1  Altered sleeping 3 -  Tired, decreased energy 3 -  Change in appetite 2 -  Feeling bad or failure about yourself  2 -  Trouble concentrating 0 -  Moving slowly or fidgety/restless 1 -  Suicidal thoughts 0 -  PHQ-9 Score 15 -  Difficult doing work/chores Extremely dIfficult -    Details           Interpretation of Total Score  Total Score Depression Severity:  1-4 = Minimal depression, 5-9 = Mild depression, 10-14 = Moderate depression, 15-19 = Moderately severe depression, 20-27 = Severe depression   Psychosocial Evaluation and Intervention:  Psychosocial Evaluation - 03/19/23 1343       Psychosocial Evaluation & Interventions   Interventions Stress management education;Relaxation education;Encouraged to exercise with the program and follow exercise prescription    Comments Patient was referred to [facility] with ILD by Dr. Vassie Loll. She and her daughter both answered questions. Patient's son and grandson live with her but her 2 daughter's are  her main support. She has a history of panic attacks and depression both treated with cymbalta. She says she does get depressed at times due to having to stay home  alot with her chronic health issues. She had back surgery in 2017 and due to osteoporosis some of the pins and screws are getitng lose and this is causing chronic back pain. She also reports bursitis in her left shoulder. She says she is not sure if she will be able to exercise but would like to try and see what she is able to do. She has trouble both going and staying asleep. She takes Alprazolam prn for sleep which helps some. Her major stressors are her son that lives with her has liver cancer diagnosed last year and 3 years ago, her son passed away Apr 25, 2023 and she says she has a difficult time with increased depression during the month of December. Her goals for the program are to get stronger, increase her endurance, be more independent and improve her QOL. She is motivated to try the program but is aware that her chronic pain may be a barrier for her to complete the program.    Expected Outcomes Short Term: start the program and attend consistently. Long term: meet her personal goals.    Continue Psychosocial Services  Follow up required by staff             Psychosocial Re-Evaluation:   Psychosocial Discharge (Final Psychosocial Re-Evaluation):    Education: Education Goals: Education classes will be provided on a weekly basis, covering required topics. Participant will state understanding/return demonstration of topics presented.  Learning Barriers/Preferences:  Learning Barriers/Preferences - 03/19/23 1339       Learning Barriers/Preferences   Learning Barriers None    Learning Preferences Audio;Written Material;Skilled Demonstration             Education Topics: How Lungs Work and Diseases: - Discuss the anatomy of the lungs and diseases that can affect the lungs, such as COPD.   Exercise: -Discuss the  importance of exercise, FITT principles of exercise, normal and abnormal responses to exercise, and how to exercise safely.   Environmental Irritants: -Discuss types of environmental irritants and how to limit exposure to environmental irritants.   Meds/Inhalers and oxygen: - Discuss respiratory medications, definition of an inhaler and oxygen, and the proper way to use an inhaler and oxygen.   Energy Saving Techniques: - Discuss methods to conserve energy and decrease shortness of breath when performing activities of daily living.    Bronchial Hygiene / Breathing Techniques: - Discuss breathing mechanics, pursed-lip breathing technique,  proper posture, effective ways to clear airways, and other functional breathing techniques   Cleaning Equipment: - Provides group verbal and written instruction about the health risks of elevated stress, cause of high stress, and healthy ways to reduce stress.   Nutrition I: Fats: - Discuss the types of cholesterol, what cholesterol does to the body, and how cholesterol levels can be controlled.   Nutrition II: Labels: -Discuss the different components of food labels and how to read food labels.   Respiratory Infections: - Discuss the signs and symptoms of respiratory infections, ways to prevent respiratory infections, and the importance of seeking medical treatment when having a respiratory infection.   Stress I: Signs and Symptoms: - Discuss the causes of stress, how stress may lead to anxiety and depression, and ways to limit stress.   Stress II: Relaxation: -Discuss relaxation techniques to limit stress.   Oxygen for Home/Travel: - Discuss how to prepare for travel when on oxygen and proper ways to transport and store oxygen to ensure safety.   Knowledge Questionnaire Score:  Knowledge Questionnaire Score -  03/20/23 1425       Knowledge Questionnaire Score   Pre Score 16/18   with daughter            Core  Components/Risk Factors/Patient Goals at Admission:  Personal Goals and Risk Factors at Admission - 03/20/23 1425       Core Components/Risk Factors/Patient Goals on Admission    Weight Management Yes;Weight Loss;Weight Maintenance    Intervention Weight Management: Develop a combined nutrition and exercise program designed to reach desired caloric intake, while maintaining appropriate intake of nutrient and fiber, sodium and fats, and appropriate energy expenditure required for the weight goal.;Weight Management: Provide education and appropriate resources to help participant work on and attain dietary goals.;Weight Management/Obesity: Establish reasonable short term and long term weight goals.;Obesity: Provide education and appropriate resources to help participant work on and attain dietary goals.    Admit Weight 159 lb 8 oz (72.3 kg)    Goal Weight: Short Term 155 lb (70.3 kg)    Goal Weight: Long Term 155 lb (70.3 kg)    Expected Outcomes Short Term: Continue to assess and modify interventions until short term weight is achieved;Long Term: Adherence to nutrition and physical activity/exercise program aimed toward attainment of established weight goal;Weight Maintenance: Understanding of the daily nutrition guidelines, which includes 25-35% calories from fat, 7% or less cal from saturated fats, less than 200mg  cholesterol, less than 1.5gm of sodium, & 5 or more servings of fruits and vegetables daily;Weight Loss: Understanding of general recommendations for a balanced deficit meal plan, which promotes 1-2 lb weight loss per week and includes a negative energy balance of [phone] kcal/d;Understanding recommendations for meals to include 15-35% energy as protein, 25-35% energy from fat, 35-60% energy from carbohydrates, less than 200mg  of dietary cholesterol, 20-35 gm of total fiber daily;Understanding of distribution of calorie intake throughout the day with the consumption of 4-5 meals/snacks     Improve shortness of breath with ADL's Yes    Intervention Provide education, individualized exercise plan and daily activity instruction to help decrease symptoms of SOB with activities of daily living.    Expected Outcomes Short Term: Improve cardiorespiratory fitness to achieve a reduction of symptoms when performing ADLs;Long Term: Be able to perform more ADLs without symptoms or delay the onset of symptoms    Increase knowledge of respiratory medications and ability to use respiratory devices properly  Yes    Intervention Provide education and demonstration as needed of appropriate use of medications, inhalers, and oxygen therapy.    Expected Outcomes Short Term: Achieves understanding of medications use. Understands that oxygen is a medication prescribed by physician. Demonstrates appropriate use of inhaler and oxygen therapy.;Long Term: Maintain appropriate use of medications, inhalers, and oxygen therapy.    Heart Failure Yes    Intervention Provide a combined exercise and nutrition program that is supplemented with education, support and counseling about heart failure. Directed toward relieving symptoms such as shortness of breath, decreased exercise tolerance, and extremity edema.    Expected Outcomes Improve functional capacity of life;Short term: Attendance in program 2-3 days a week with increased exercise capacity. Reported lower sodium intake. Reported increased fruit and vegetable intake. Reports medication compliance.;Short term: Daily weights obtained and reported for increase. Utilizing diuretic protocols set by physician.;Long term: Adoption of self-care skills and reduction of barriers for early signs and symptoms recognition and intervention leading to self-care maintenance.    Hypertension Yes    Intervention Provide education on lifestyle modifcations including regular physical activity/exercise, weight  management, moderate sodium restriction and increased consumption of fresh  fruit, vegetables, and low fat dairy, alcohol moderation, and smoking cessation.;Monitor prescription use compliance.    Expected Outcomes Short Term: Continued assessment and intervention until BP is < 140/48mm HG in hypertensive participants. < 130/73mm HG in hypertensive participants with diabetes, heart failure or chronic kidney disease.;Long Term: Maintenance of blood pressure at goal levels.    Lipids Yes    Intervention Provide education and support for participant on nutrition & aerobic/resistive exercise along with prescribed medications to achieve LDL 70mg , HDL >40mg .    Expected Outcomes Short Term: Participant states understanding of desired cholesterol values and is compliant with medications prescribed. Participant is following exercise prescription and nutrition guidelines.;Long Term: Cholesterol controlled with medications as prescribed, with individualized exercise RX and with personalized nutrition plan. Value goals: LDL < 70mg , HDL > 40 mg.             Core Components/Risk Factors/Patient Goals Review:    Core Components/Risk Factors/Patient Goals at Discharge (Final Review):    ITP Comments:  ITP Comments     Row Name 03/20/23 1414 03/21/23 1310         ITP Comments Patient attend orientation today.  Patient is attending Pulmonary Rehabilitation Program.  Documentation for diagnosis can be found in [facility] 03/06/23 with Dr. Vassie Loll.  Reviewed medical chart, RPE/RPD, gym safety, and program guidelines.  Patient was fitted to equipment they will be using during rehab.  Patient is scheduled to start exercise on Tuesday 03/27/23 at 1500.   Initial ITP created and sent for review and signature by Dr. Erick Blinks, Medical Director for Pulmonary Rehabilitation Program. 30 day review completed. ITP sent to Dr.Jehanzeb Memon, Medical Director of  Pulmonary Rehab. Continue with ITP unless changes are made by physician.  Only completed orientation thus far.                Comments: 30 day review

## 2023-03-27 DIAGNOSIS — R0789 Other chest pain: Secondary | ICD-10-CM | POA: Diagnosis not present

## 2023-03-27 DIAGNOSIS — I509 Heart failure, unspecified: Secondary | ICD-10-CM | POA: Diagnosis not present

## 2023-03-27 DIAGNOSIS — F419 Anxiety disorder, unspecified: Secondary | ICD-10-CM | POA: Diagnosis not present

## 2023-03-27 DIAGNOSIS — M7989 Other specified soft tissue disorders: Secondary | ICD-10-CM | POA: Diagnosis not present

## 2023-03-27 DIAGNOSIS — M19071 Primary osteoarthritis, right ankle and foot: Secondary | ICD-10-CM | POA: Diagnosis not present

## 2023-03-27 DIAGNOSIS — E785 Hyperlipidemia, unspecified: Secondary | ICD-10-CM | POA: Diagnosis not present

## 2023-03-27 DIAGNOSIS — M549 Dorsalgia, unspecified: Secondary | ICD-10-CM | POA: Diagnosis not present

## 2023-03-27 DIAGNOSIS — M109 Gout, unspecified: Secondary | ICD-10-CM | POA: Diagnosis not present

## 2023-03-27 DIAGNOSIS — I130 Hypertensive heart and chronic kidney disease with heart failure and stage 1 through stage 4 chronic kidney disease, or unspecified chronic kidney disease: Secondary | ICD-10-CM | POA: Diagnosis not present

## 2023-03-27 DIAGNOSIS — M79672 Pain in left foot: Secondary | ICD-10-CM | POA: Diagnosis not present

## 2023-03-27 DIAGNOSIS — I4891 Unspecified atrial fibrillation: Secondary | ICD-10-CM | POA: Diagnosis not present

## 2023-03-27 DIAGNOSIS — I700 Atherosclerosis of aorta: Secondary | ICD-10-CM | POA: Diagnosis not present

## 2023-03-27 DIAGNOSIS — J449 Chronic obstructive pulmonary disease, unspecified: Secondary | ICD-10-CM | POA: Diagnosis not present

## 2023-03-27 DIAGNOSIS — M19072 Primary osteoarthritis, left ankle and foot: Secondary | ICD-10-CM | POA: Diagnosis not present

## 2023-03-27 DIAGNOSIS — I739 Peripheral vascular disease, unspecified: Secondary | ICD-10-CM | POA: Diagnosis not present

## 2023-03-27 DIAGNOSIS — K219 Gastro-esophageal reflux disease without esophagitis: Secondary | ICD-10-CM | POA: Diagnosis not present

## 2023-03-27 DIAGNOSIS — N189 Chronic kidney disease, unspecified: Secondary | ICD-10-CM | POA: Diagnosis not present

## 2023-03-27 DIAGNOSIS — Z79899 Other long term (current) drug therapy: Secondary | ICD-10-CM | POA: Diagnosis not present

## 2023-03-27 DIAGNOSIS — R9431 Abnormal electrocardiogram [ECG] [EKG]: Secondary | ICD-10-CM | POA: Diagnosis not present

## 2023-03-27 DIAGNOSIS — R079 Chest pain, unspecified: Secondary | ICD-10-CM | POA: Diagnosis not present

## 2023-03-30 ENCOUNTER — Inpatient Hospital Stay: Payer: PPO | Attending: Physician Assistant

## 2023-03-30 DIAGNOSIS — N1832 Chronic kidney disease, stage 3b: Secondary | ICD-10-CM | POA: Insufficient documentation

## 2023-03-30 DIAGNOSIS — Z87891 Personal history of nicotine dependence: Secondary | ICD-10-CM | POA: Insufficient documentation

## 2023-03-30 DIAGNOSIS — I4891 Unspecified atrial fibrillation: Secondary | ICD-10-CM | POA: Insufficient documentation

## 2023-03-30 DIAGNOSIS — Z7901 Long term (current) use of anticoagulants: Secondary | ICD-10-CM | POA: Insufficient documentation

## 2023-03-30 DIAGNOSIS — Z800 Family history of malignant neoplasm of digestive organs: Secondary | ICD-10-CM | POA: Insufficient documentation

## 2023-03-30 DIAGNOSIS — K649 Unspecified hemorrhoids: Secondary | ICD-10-CM | POA: Insufficient documentation

## 2023-03-30 DIAGNOSIS — D500 Iron deficiency anemia secondary to blood loss (chronic): Secondary | ICD-10-CM | POA: Diagnosis not present

## 2023-03-30 LAB — CBC WITH DIFFERENTIAL/PLATELET
Abs Immature Granulocytes: 0.06 10*3/uL (ref 0.00–0.07)
Basophils Absolute: 0.1 10*3/uL (ref 0.0–0.1)
Basophils Relative: 1 %
Eosinophils Absolute: 0.3 10*3/uL (ref 0.0–0.5)
Eosinophils Relative: 4 %
HCT: 37 % (ref 36.0–46.0)
Hemoglobin: 12.4 g/dL (ref 12.0–15.0)
Immature Granulocytes: 1 %
Lymphocytes Relative: 27 %
Lymphs Abs: 1.9 10*3/uL (ref 0.7–4.0)
MCH: 31.5 pg (ref 26.0–34.0)
MCHC: 33.5 g/dL (ref 30.0–36.0)
MCV: 93.9 fL (ref 80.0–100.0)
Monocytes Absolute: 0.5 10*3/uL (ref 0.1–1.0)
Monocytes Relative: 7 %
Neutro Abs: 4.4 10*3/uL (ref 1.7–7.7)
Neutrophils Relative %: 60 %
Platelets: 246 10*3/uL (ref 150–400)
RBC: 3.94 MIL/uL (ref 3.87–5.11)
RDW: 13.5 % (ref 11.5–15.5)
WBC: 7.2 10*3/uL (ref 4.0–10.5)
nRBC: 0 % (ref 0.0–0.2)

## 2023-03-30 LAB — BASIC METABOLIC PANEL WITH GFR
Anion gap: 12 (ref 5–15)
BUN: 18 mg/dL (ref 8–23)
CO2: 25 mmol/L (ref 22–32)
Calcium: 9.3 mg/dL (ref 8.9–10.3)
Chloride: 99 mmol/L (ref 98–111)
Creatinine, Ser: 1.22 mg/dL — ABNORMAL HIGH (ref 0.44–1.00)
GFR, Estimated: 43 mL/min — ABNORMAL LOW
Glucose, Bld: 189 mg/dL — ABNORMAL HIGH (ref 70–99)
Potassium: 3.8 mmol/L (ref 3.5–5.1)
Sodium: 136 mmol/L (ref 135–145)

## 2023-03-30 LAB — IRON AND TIBC
Iron: 74 ug/dL (ref 28–170)
Saturation Ratios: 24 % (ref 10.4–31.8)
TIBC: 311 ug/dL (ref 250–450)
UIBC: 237 ug/dL

## 2023-03-30 LAB — FERRITIN: Ferritin: 122 ng/mL (ref 11–307)

## 2023-03-30 LAB — VITAMIN B12: Vitamin B-12: 213 pg/mL (ref 180–914)

## 2023-04-03 ENCOUNTER — Encounter (HOSPITAL_COMMUNITY): Payer: PPO

## 2023-04-04 LAB — METHYLMALONIC ACID, SERUM: Methylmalonic Acid, Quantitative: 409 nmol/L — ABNORMAL HIGH (ref 0–378)

## 2023-04-05 ENCOUNTER — Encounter (HOSPITAL_COMMUNITY): Payer: PPO

## 2023-04-06 ENCOUNTER — Encounter: Payer: Self-pay | Admitting: Nurse Practitioner

## 2023-04-06 ENCOUNTER — Inpatient Hospital Stay: Payer: PPO | Admitting: Physician Assistant

## 2023-04-06 ENCOUNTER — Ambulatory Visit: Payer: PPO | Attending: Nurse Practitioner | Admitting: Nurse Practitioner

## 2023-04-06 VITALS — BP 136/55 | HR 57 | Temp 97.4°F | Resp 18

## 2023-04-06 VITALS — BP 132/70 | HR 86 | Ht 61.0 in | Wt 160.8 lb

## 2023-04-06 DIAGNOSIS — R0609 Other forms of dyspnea: Secondary | ICD-10-CM | POA: Diagnosis not present

## 2023-04-06 DIAGNOSIS — D509 Iron deficiency anemia, unspecified: Secondary | ICD-10-CM

## 2023-04-06 DIAGNOSIS — M79672 Pain in left foot: Secondary | ICD-10-CM

## 2023-04-06 DIAGNOSIS — E785 Hyperlipidemia, unspecified: Secondary | ICD-10-CM | POA: Diagnosis not present

## 2023-04-06 DIAGNOSIS — N183 Chronic kidney disease, stage 3 unspecified: Secondary | ICD-10-CM

## 2023-04-06 DIAGNOSIS — I25119 Atherosclerotic heart disease of native coronary artery with unspecified angina pectoris: Secondary | ICD-10-CM

## 2023-04-06 DIAGNOSIS — Z789 Other specified health status: Secondary | ICD-10-CM | POA: Diagnosis not present

## 2023-04-06 DIAGNOSIS — R0789 Other chest pain: Secondary | ICD-10-CM

## 2023-04-06 DIAGNOSIS — I480 Paroxysmal atrial fibrillation: Secondary | ICD-10-CM

## 2023-04-06 DIAGNOSIS — Z87442 Personal history of urinary calculi: Secondary | ICD-10-CM | POA: Insufficient documentation

## 2023-04-06 DIAGNOSIS — J449 Chronic obstructive pulmonary disease, unspecified: Secondary | ICD-10-CM | POA: Diagnosis not present

## 2023-04-06 DIAGNOSIS — I5032 Chronic diastolic (congestive) heart failure: Secondary | ICD-10-CM

## 2023-04-06 DIAGNOSIS — E538 Deficiency of other specified B group vitamins: Secondary | ICD-10-CM | POA: Diagnosis not present

## 2023-04-06 DIAGNOSIS — N3281 Overactive bladder: Secondary | ICD-10-CM | POA: Insufficient documentation

## 2023-04-06 DIAGNOSIS — D500 Iron deficiency anemia secondary to blood loss (chronic): Secondary | ICD-10-CM

## 2023-04-06 DIAGNOSIS — N3941 Urge incontinence: Secondary | ICD-10-CM | POA: Insufficient documentation

## 2023-04-06 MED ORDER — VITAMIN B-12 1000 MCG PO TABS
1000.0000 ug | ORAL_TABLET | Freq: Every day | ORAL | 3 refills | Status: DC
Start: 2023-04-06 — End: 2023-09-04

## 2023-04-06 NOTE — Progress Notes (Signed)
[facility] [address], [place] [address]   CLINIC:  Medical Oncology/Hematology  PCP:  Sheela Stack [address] [place] [address] [phone]   REASON FOR VISIT:  Follow-up for iron deficiency anemia   CURRENT THERAPY: Intermittent IV iron infusions (last given 09/06/2021)  INTERVAL HISTORY:   [name] 89 y.o. female returns for routine follow-up of iron deficiency anemia.  She was last seen by Rojelio Brenner PA-C on 10/10/2022.  She received IV Feraheme x 1 on 10/12/2022.  At today's visit, she reports feeling fair.  She has not noticed any rectal bleeding since summer 2024 from hemorrhoids, but this is better now that she is taking stool softeners.  She had some mild nosebleeds this winter, but no "gushing" epistaxis.  No melena.  She takes Eliquis for her A-fib.  She reports ongoing fatigue, dyspnea on exertion, and shortness of breath at rest.  She is having leg cramps.  She has some mild ice pica.  She has intermittent headaches.  She has intermittent chest pain.   She has multiple other complaints unrelated to today's visit, which are further documented in ROS, and also makes note of her recent issues with cellulitis, neuropathy (thought to be related to "pin in her back"), and heart problems.  She has little to no energy and 25% appetite. She endorses that she is maintaining a stable weight.  ASSESSMENT & PLAN:  1.  Normocytic anemia, secondary to iron deficiency (GI blood loss) and B12 deficiency - Iron deficiency anemia secondary to GI blood loss from hemorrhoids - She follows with GI, was also evaluated by general surgery but is not a candidate for surgical intervention or banding - She takes Eliquis in the setting of atrial fibrillation - She has CKD stage IIIb - Colonoscopy (06/28/2020): Internal and external hemorrhoids - EGD (02/16/2020): Normal esophagus, erythematous mucosa of the stomach, normal duodenum - She could  not tolerate iron supplement due to constipation and lack of improvement. - Last iron was Feraheme x 1 on 10/12/2022 - She has ongoing fatigue - Most recent labs (03/30/2023): Hgb 12.4/MCV 93.9, ferritin 122, iron saturation 24%.  Baseline CKD with creatinine 1.22/GFR 43.  Marginal B12 at 213 with elevated MMA 409. - PLAN: No indication for IV iron at this time. - Recommend starting vitamin B12 1000 mcg daily.   - Repeat labs and RTC in 4 months followed by office visit - We will defer management of hemorrhoids to gastroenterology and surgery.  PLAN SUMMARY: >> Rx sent to pharmacy for vitamin B12 1000 mcg daily >> Labs in 4 months = CBC/D, BMP, ferritin, iron/TIBC, B12, MMA >> OFFICE visit in 4 months (1 week after labs)     REVIEW OF SYSTEMS:   Review of Systems  Constitutional:  Positive for fatigue. Negative for appetite change, chills, diaphoresis, fever and unexpected weight change.  HENT:   Negative for lump/mass and nosebleeds.   Eyes:  Negative for eye problems.  Respiratory:  Positive for cough and shortness of breath. Negative for hemoptysis.   Cardiovascular:  Positive for chest pain, leg swelling and palpitations.  Gastrointestinal:  Positive for nausea. Negative for abdominal pain, blood in stool, constipation, diarrhea and vomiting.  Genitourinary:  Positive for bladder incontinence. Negative for hematuria.   Musculoskeletal:  Positive for arthralgias, back pain, myalgias and neck pain.  Neurological:  Positive for dizziness, headaches and numbness. Negative for light-headedness.  Hematological:  Does not bruise/bleed easily.  Psychiatric/Behavioral:  Positive for depression and sleep disturbance. The patient is nervous/anxious.      PHYSICAL EXAM:  ECOG PERFORMANCE STATUS: 2 - Symptomatic, <50% confined to bed  Vitals:   04/06/23 1343  BP: (!) 136/55  Pulse: (!) 57  Resp: 18  Temp: (!) 97.4 F (36.3 C)  SpO2: 99%   Physical Exam Constitutional:       Appearance: Normal appearance. She is obese.  Cardiovascular:     Rate and Rhythm: Rhythm irregular.     Heart sounds: Normal heart sounds.  Pulmonary:     Breath sounds: Normal breath sounds.  Neurological:     General: No focal deficit present.     Mental Status: Mental status is at baseline.  Psychiatric:        Behavior: Behavior normal. Behavior is cooperative.     PAST MEDICAL/SURGICAL HISTORY:  Past Medical History:  Diagnosis Date   Anxiety    Arthritis    Atrial fibrillation (HCC)    Bursitis    Left shoulder   Cataract    CHF (congestive heart failure) (HCC)    CKD (chronic kidney disease)    stage 3-4   COPD (chronic obstructive pulmonary disease) (HCC)    Coronary atherosclerosis of native coronary artery    a. s/p DES to LCx in 04/2013 b. cath in 11/2015 showing patent stent with 20% prox-LAD and 80% ostial RCA stenosis for which medical management was recommended due to small artery size   Depression    Diastolic heart failure (HCC)    EF 55-60%   Dysphagia, unspecified(787.20)    Dyspnea    Dysrhythmia    Essential hypertension    GERD (gastroesophageal reflux disease)    Hx Schatzki's ring, multiple EGD/ED last 01/06/2004   Gout    Headache    History of anemia    Hyperlipidemia    Internal hemorrhoids without mention of complication    MI (myocardial infarction) (HCC) 2006   Microscopic colitis 2003   Panic disorder without agoraphobia    Paresthesia    Pneumonia 12/2011   PVD (peripheral vascular disease) (HCC)    S/P colonoscopy 09/27/2001   internal hemorrhoids, desc colon inflam polyp, SB BX-chronic duodenitis, colitis   Sleep apnea    Thyroid disease    Past Surgical History:  Procedure Laterality Date   ABDOMINAL HYSTERECTOMY     ABDOMINAL HYSTERECTOMY     AGILE CAPSULE N/A 08/30/2020   Procedure: AGILE CAPSULE;  Surgeon: Corbin Ade, MD;  Location: [facility];  Service: Endoscopy;  Laterality: N/A;  7:30am   ANTERIOR AND POSTERIOR  REPAIR     with resection of vagina   ANTERIOR LAT LUMBAR FUSION N/A 08/01/2016   Procedure: Lumbar Two-Lumbar Five Transpsoas lateral interbody fusion with Lumbar Two-Three lateral plate fixation;  Surgeon: Loura Halt Ditty, MD;  Location: [facility] OR;  Service: Neurosurgery;  Laterality: N/A;  L2-5 Transpsoas lateral interbody fusion with L2-3 lateral plate fixation   APPENDECTOMY     BACK SURGERY     BIOPSY  07/05/2015   Procedure: BIOPSY;  Surgeon: Corbin Ade, MD;  Location: [facility];  Service: Endoscopy;;  gastric polyp biopsy, ascending colon biopsy   BIOPSY  02/16/2020   Procedure: BIOPSY;  Surgeon: Corbin Ade, MD;  Location: [facility];  Service: Endoscopy;;   BIOPSY  06/28/2020   Procedure: BIOPSY;  Surgeon: Corbin Ade, MD;  Location: [facility];  Service: Endoscopy;;   BLADDER SUSPENSION  11/09/2011  Procedure: TRANSVAGINAL TAPE (TVT) PROCEDURE;  Surgeon: Ky Barban, MD;  Location: [facility] ORS;  Service: Urology;  Laterality: N/A;   bladder tack  06/2010   BREAST LUMPECTOMY  1998   left, benign   CARDIAC CATHETERIZATION     CARDIAC CATHETERIZATION     CARDIAC CATHETERIZATION N/A 12/16/2015   Procedure: Left Heart Cath and Coronary Angiography;  Surgeon: Lennette Bihari, MD;  Location: [facility] INVASIVE CV LAB;  Service: Cardiovascular;  Laterality: N/A;   CARDIOVERSION N/A 10/04/2017   Procedure: CARDIOVERSION;  Surgeon: Laqueta Linden, MD;  Location: [facility] ORS;  Service: Cardiovascular;  Laterality: N/A;   CARDIOVERSION N/A 01/30/2018   Procedure: CARDIOVERSION;  Surgeon: Laqueta Linden, MD;  [facility]: [facility];  Service: Cardiovascular;  Laterality: N/A;   CARDIOVERSION N/A 11/10/2019   Procedure: CARDIOVERSION;  Surgeon: Wendall Stade, MD;  Location: [facility] ENDOSCOPY;  Service: Cardiovascular;  Laterality: N/A;   CARPAL TUNNEL RELEASE  1989   left   cataract surgery     CHOLECYSTECTOMY  1998   Cholecystectomy     COLONOSCOPY  03/16/2011   multiple  hyperplastic colon polyps, sigmoid diverticulosis, melanosis coli   COLONOSCOPY WITH PROPOFOL N/A 07/05/2015   RMR:one 5 mm polyp in descending colon   COLONOSCOPY WITH PROPOFOL N/A 06/28/2020   Procedure: COLONOSCOPY WITH PROPOFOL;  Surgeon: Corbin Ade, MD;  [facility];  Service: Endoscopy;  Laterality: N/A;  am appt   CORONARY ANGIOGRAPHY N/A 05/16/2018   Procedure: CORONARY ANGIOGRAPHY (CATH LAB);  Surgeon: Lyn Records, MD;  Location: [facility] INVASIVE CV LAB;  Service: Cardiovascular;  Laterality: N/A;   CORONARY ANGIOPLASTY WITH STENT PLACEMENT  2015   ESOPHAGEAL DILATION N/A 07/05/2015   Procedure: ESOPHAGEAL DILATION;  Surgeon: Corbin Ade, MD;  [facility]: [facility];  Service: Endoscopy;  Laterality: N/A;   ESOPHAGOGASTRODUODENOSCOPY (EGD) WITH PROPOFOL N/A 07/05/2015   XBM:WUXLKG   ESOPHAGOGASTRODUODENOSCOPY (EGD) WITH PROPOFOL N/A 02/16/2020   Procedure: ESOPHAGOGASTRODUODENOSCOPY (EGD) WITH PROPOFOL;  Surgeon: Corbin Ade, MD;  Normal examined esophagus s/p dilation, erythematous mucosa in the stomach s/p biopsy, normal examined duodenum. Pathology with mild gastropathy, negative for H. Pylori.    GIVENS CAPSULE STUDY N/A 11/10/2020   Procedure: GIVENS CAPSULE STUDY;  Surgeon: Corbin Ade, MD;  [facility]: [facility];  Service: Endoscopy;  Laterality: N/A;  7:30am   JOINT REPLACEMENT Right 2007   right knee   left hand surgery     LEFT HEART CATHETERIZATION WITH CORONARY ANGIOGRAM N/A 05/14/2013   Procedure: LEFT HEART CATHETERIZATION WITH CORONARY ANGIOGRAM;  Surgeon: Micheline Chapman, MD;  Location: [facility] CATH LAB;  Service: Cardiovascular;  Laterality: N/A;   left rotator cuff surgery     LUMBAR LAMINECTOMY/DECOMPRESSION MICRODISCECTOMY N/A 10/11/2012   Procedure: LUMBAR LAMINECTOMY/DECOMPRESSION MICRODISCECTOMY 2 LEVELS;  Surgeon: Karn Cassis, MD;  Location: [facility]URO [facility];  Service: Neurosurgery;  Laterality: N/A;  L3-4 L4-5 Laminectomy   LUMBAR WOUND  DEBRIDEMENT N/A 09/27/2015   Procedure: Exploration of Lumbar Wound w/ Repair CSF Leak/Lumbar Drain Placement;  Surgeon: Hilda Lias, MD;  Location: [facility] NEURO ORS;  Service: Neurosurgery;  Laterality: N/A;   MALONEY DILATION  03/16/2011   Gastritis. No H.pylori on bx. 35F maloney dilation with disruption of  occult cevical esophageal web   MALONEY DILATION N/A 02/16/2020   Procedure: MALONEY DILATION;  Surgeon: Corbin Ade, MD;  [facility]: [facility];  Service: Endoscopy;  Laterality: N/A;   NASAL SINUS SURGERY  right knee replacement  2007   right leg benign tumor     SHOULDER SURGERY Left    TEE WITHOUT CARDIOVERSION N/A 10/04/2017   Procedure: TRANSESOPHAGEAL ECHOCARDIOGRAM (TEE) WITH PROPOFOL;  Surgeon: Laqueta Linden, MD;  Location: [facility];  Service: Cardiovascular;  Laterality: N/A;   TEE WITHOUT CARDIOVERSION N/A 11/10/2019   Procedure: TRANSESOPHAGEAL ECHOCARDIOGRAM (TEE);  Surgeon: Wendall Stade, MD;  Location: [facility] ENDOSCOPY;  Service: Cardiovascular;  Laterality: N/A;   TONSILLECTOMY     unspecified area, hysterectomy  1972   partial    SOCIAL HISTORY:  Social History   Socioeconomic History   Marital status: Divorced    Spouse name: Not on file   Number of children: 5   Years of education: Not on file   Highest education level: Not on file  Occupational History   Occupation: retired  Tobacco Use   Smoking status: Former    Current packs/day: 0.00    Average packs/day: 1 pack/day for 64.0 years (64.0 ttl pk-yrs)    Types: Cigarettes    Start date: 12/24/1947    Quit date: 11/17/2001    Years since quitting: 21.3   Smokeless tobacco: Never   Tobacco comments:    Quit smoking in 2003  Vaping Use   Vaping status: Never Used  Substance and Sexual Activity   Alcohol use: Not Currently   Drug use: No   Sexual activity: Never  Other Topics Concern   Not on file  Social History Narrative   Divorced.   Sister had colon perforation & died from  complications in [place], [place]   Social Drivers of Health   Financial Resource Strain: Not on file  Food Insecurity: Not on file  Transportation Needs: Not on file  Physical Activity: Not on file  Stress: Not on file  Social Connections: Not on file  Intimate Partner Violence: Not on file    FAMILY HISTORY:  Family History  Problem Relation Age of Onset   Stroke Mother    Parkinson's disease Father    Coronary artery disease Other        family Hx-sons   Cancer Other    Stroke Other        family Hx   Hypertension Other        family Hx   Diabetes Brother    Heart disease Son        before age 5   Diabetes Son    Stroke Daughter 48   Colon cancer Grandson        diagnosed 40   Inflammatory bowel disease Neg Hx     CURRENT MEDICATIONS:  Outpatient Encounter Medications as of 04/06/2023  Medication Sig   acetaminophen (TYLENOL) 500 MG tablet Take 500 mg by mouth every 6 (six) hours as needed for headache or moderate pain.   ALPRAZolam (XANAX) 0.5 MG tablet Take 0.5 mg by mouth daily as needed for anxiety or sleep.   apixaban (ELIQUIS) 5 MG TABS tablet Take 1 tablet (5 mg total) by mouth 2 (two) times daily.   ascorbic acid (VITAMIN C) 500 MG tablet Take 1 tablet (500 mg total) by mouth daily.   cyanocobalamin (VITAMIN B12) 1000 MCG tablet Take 1 tablet (1,000 mcg total) by mouth daily.   diltiazem (CARDIZEM CD) 180 MG 24 hr capsule Take 1 capsule (180 mg total) by mouth daily before breakfast.   diltiazem (TIAZAC) 120 MG 24 hr capsule Take 1 capsule (120 mg total) by mouth at bedtime.  DULoxetine (CYMBALTA) 60 MG capsule Take 60 mg by mouth daily.   estradiol (ESTRACE) 0.1 MG/GM vaginal cream Discard plastic applicator. Insert a blueberry size amount (approximately 1 gram) of cream on fingertip inside vagina at bedtime every night for 1 week then 2 nights per week for long term use.   fluticasone (CUTIVATE) 0.05 % cream Apply 1 application topically 2 (two) times daily as  needed (skin tears).   furosemide (LASIX) 40 MG tablet Take 1 tablet (40 mg total) by mouth daily.   hydrocortisone (ANUSOL-HC) 2.5 % rectal cream Place 1 application rectally 2 (two) times daily.   isosorbide mononitrate (IMDUR) 30 MG 24 hr tablet Take 1 tablet (30 mg total) by mouth daily.   magnesium oxide (MAG-OX) 400 (240 Mg) MG tablet Take 1 tablet by mouth daily.   metoprolol succinate (TOPROL-XL) 50 MG 24 hr tablet Take 1 tablet (50 mg total) by mouth 2 (two) times daily. Take with or immediately following a meal.   Multiple Vitamins-Minerals (HAIR/SKIN/NAILS) CAPS Take 1 tablet by mouth daily.   Naphazoline HCl (CLEAR EYES OP) Place 1 drop into both eyes daily as needed (itching).   nitroGLYCERIN (NITROSTAT) 0.4 MG SL tablet DISSOLVE ONE TABLET UNDER TONGUE EVERY 5 MINUTES UP TO 3 DOSES AS NEEDED FOR CHEST PAIN.   pantoprazole (PROTONIX) 40 MG tablet TAKE ONE TABLET BY MOUTH TWICE DAILY BEFORE MEALS   potassium chloride (KLOR-CON) 10 MEQ tablet Take 2 tablets (20 mEq total) by mouth 2 (two) times daily.   traMADol (ULTRAM) 50 MG tablet Take 1 tablet (50 mg total) by mouth every 6 (six) hours as needed.   TRELEGY ELLIPTA 100-62.5-25 MCG/ACT AEPB Take 1 puff by mouth daily.   triamcinolone cream (KENALOG) 0.1 % Apply topically 2 (two) times daily.   zinc sulfate 220 (50 Zn) MG capsule Take 1 capsule (220 mg total) by mouth daily.   No facility-administered encounter medications on file as of 04/06/2023.    ALLERGIES:  Allergies  Allergen Reactions   Cephalosporins Diarrhea and Nausea Only    Lightheaded   Levaquin [Levofloxacin In D5w] Swelling   Macrodantin [Nitrofurantoin Macrocrystal] Swelling   Phenothiazines Anaphylaxis and Hives   Polysorbate Anaphylaxis   Prednisone Shortness Of Breath   Buspirone Itching   Cardura [Doxazosin Mesylate] Itching   Codeine Itching   Acyclovir And Related Itching    Redness of skin   Colcrys [Colchicine] Nausea Only    Upset stomach     Prochlorperazine Other (See Comments)    "Upset stomach"   Ranexa [Ranolazine]     Severe drop in BP   Sulfa Antibiotics Other (See Comments)    Severe blisters   Atorvastatin Hives    Cramping; tolerates Crestor ok   Colestipol Palpitations   Ofloxacin Rash   Other Itching and Rash    "WOOL"= make skin look like it has been burned   Penicillins Other (See Comments)    Causes redness all over. Has patient had a PCN reaction causing immediate rash, facial/tongue/throat swelling, SOB or lightheadedness with hypotension: No Has patient had a PCN reaction causing severe rash involving mucus membranes or skin necrosis: No Has patient had a PCN reaction that required hospitalization No Has patient had a PCN reaction occurring within the last 10 years: No If all of the above answers are "NO", then may proceed with Cephalosporin use.    Pimozide Hives and Itching   Zetia [Ezetimibe] Itching and Rash    LABORATORY DATA:  I have reviewed  the labs as listed.  CBC    Component Value Date/Time   WBC 7.2 03/30/2023 1012   RBC 3.94 03/30/2023 1012   HGB 12.4 03/30/2023 1012   HCT 37.0 03/30/2023 1012   PLT 246 03/30/2023 1012   MCV 93.9 03/30/2023 1012   MCH 31.5 03/30/2023 1012   MCHC 33.5 03/30/2023 1012   RDW 13.5 03/30/2023 1012   LYMPHSABS 1.9 03/30/2023 1012   MONOABS 0.5 03/30/2023 1012   EOSABS 0.3 03/30/2023 1012   BASOSABS 0.1 03/30/2023 1012      Latest Ref Rng & Units 03/30/2023   10:12 AM 03/01/2023   11:59 AM 01/03/2023    1:50 PM  CMP  Glucose 70 - 99 mg/dL 213   086   BUN 8 - 23 mg/dL 18   16   Creatinine 5.78 - 1.00 mg/dL 4.69  6.29  5.28   Sodium 135 - 145 mmol/L 136   135   Potassium 3.5 - 5.1 mmol/L 3.8   2.8   Chloride 98 - 111 mmol/L 99   99   CO2 22 - 32 mmol/L 25   25   Calcium 8.9 - 10.3 mg/dL 9.3   8.7     DIAGNOSTIC IMAGING:  I have independently reviewed the relevant imaging and discussed with the patient.   WRAP UP:  All questions were  answered. The patient knows to call the clinic with any problems, questions or concerns.  Medical decision making: Moderate  Time spent on visit: I spent 20 minutes counseling the patient face to face. The total time spent in the appointment was 30 minutes and more than 50% was on counseling.  Carnella Guadalajara, PA-C  04/06/23 9:24 PM

## 2023-04-06 NOTE — Progress Notes (Signed)
Cardiology Office Note:  .   Date: 12/202/2024 ID:  [name], DOB 12-27-1932, MRN [id number] PCP: Sheela Stack  [facility] Providers Cardiologist:  Dina Rich, MD Electrophysiologist:  Lewayne Bunting, MD    History of Present Illness: .   [name] is a 89 y.o. female with a PMH of chronic diastolic CHF, PAF, CAD, hx of NSVT, IDA, hx of syncope, CKD stage 3, COPD, SHOB, who presents today for scheduled follow-up.   01/05/2023 - Today she presents for follow-up with her daughter. Admits to atypical, intermittent CP, stable SHOB. Following Pulmonology and will have an upcoming CT scan to evaluate her lungs. Believes her symptoms are related to her arthritis and spinal problems. Recently saw Dr. Barnett Abu (with [facility]), who remarked on some degenerative changes of her spine. Mentioned about some lumbar spinal stenosis as well. Dr. Danielle Dess recommended she discuss with her PCP regarding seeing a Pain Management Specialist, as he felt patient would not be a good surgical candidate. Denies any palpitations, syncope, presyncope, dizziness, orthopnea, PND, swelling or significant weight changes, acute bleeding, or claudication.  ED visit on 02/20/2023 at [facility]. Pt reported burning sensation in feet and pain from waist down. Dx with cellultis and given Rx for clindamycin.   Returned to [facility] on 03/27/2023, reported CP, back and feet pain. Reported pain along sole of feet, was difficult to ambulate. X-ray of left food revealed mild OA, nothing acute and moderate OA along right foot with marginal erosion of first metatarsal head with overlying soft tissue swelling, could possibly be related to gout, no acute fx.   04/06/2023 - Today she presents for follow-up with her daughter. Chief concern is left foot pain, daughter says that she believes that this is affecting her heart. Continues to admit to similar atypical CP. Denies any exertional chest  pain, worsening shortness of breath, palpitations, syncope, presyncope, dizziness, orthopnea, PND, swelling or significant weight changes, acute bleeding, or claudication. Denies any trauma/falls. Denies any signs/symptoms of infection  Studies Reviewed: [name] Kitchen    ABI 09/2022:  Summary:  Right: Resting right ankle-brachial index indicates moderate right lower  extremity arterial disease. The right toe-brachial index is abnormal.   Left: Resting left ankle-brachial index indicates mild left lower  extremity arterial disease. The left toe-brachial index is abnormal.  Cardiac monitor 09/2022:    4 day monitor   100% afib burden rates 57 to 162, avg 90 bpm.   Rare ventricular ectopy in the form of isolated PVCs, couplets   Reported symptoms correlated with afib normal rates, though some limitations on time accuracy     Patch Wear Time:  4 days and 4 hours (2024-04-24T14:29:15-0400 to 2024-04-28T18:35:23-0400)   Atrial Fibrillation occurred continuously (100% burden), ranging from 57-162 bpm (avg of 90 bpm). Isolated VEs were rare (<1.0%), VE Couplets were rare (<1.0%), and no VE Triplets were present.    Echo 07/2022:  1. Left ventricular ejection fraction, by estimation, is 60 to 65%. The  left ventricle has normal function. The left ventricle has no regional  wall motion abnormalities. Left ventricular diastolic function could not  be evaluated.   2. Right ventricular systolic function is normal. The right ventricular  size is normal. Tricuspid regurgitation signal is inadequate for assessing  PA pressure.   3. Left atrial size was mild to moderately dilated.   4. The mitral valve is abnormal. No evidence of mitral valve  regurgitation. No evidence of mitral stenosis. Moderate mitral  annular  calcification.   5. The aortic valve is calcified. There is moderate calcification of the  aortic valve. Aortic valve regurgitation is mild. No aortic stenosis is  present.   6. The inferior vena  cava is normal in size with greater than 50%  respiratory variability, suggesting right atrial pressure of 3 mmHg.   Comparison(s): No significant change from prior study.   Myoview 06/2020:  There was no ST segment deviation noted during stress. The study is normal. There are no perfusion defects consistent with prior infarct or current ischemia. This is a low risk study. The left ventricular ejection fraction is hyperdynamic (>65%).   LHC 2017: Ost RCA lesion, 80 %stenosed. Ost Cx to Prox Cx lesion, 0 %stenosed. Ost LAD to Prox LAD lesion, 20 %stenosed. 1st Diag lesion, 20 %stenosed. 4th Mrg lesion, 20 %stenosed. The left ventricular systolic function is normal. LV end diastolic pressure is mildly elevated. The left ventricular ejection fraction is 60-65% by visual estimate.   Normal LV function with an ejection fraction of 60-65%.  There is evidence for left ventricular hypertrophy.   Coronary obstructive disease with evidence for smooth eccentric plaque in the proximal LAD of 20%, 20% stenosis in the inferior branch of the first diagonal vessel; a widely patent previously placed left circumflex stent extending from the ostium to the first marginal takeoff in a large dominant circumflex vessel with 20% distal narrowing; and a small nondominant RCA with 80% ostial stenosis.   RECOMMENDATION: Medical therapy.  Of note, the patient does have significant change in respiratory intrathoracic pressure from inspiratory and expiratory cycles.   Risk Assessment/Calculations:    CHA2DS2-VASc Score = 6  This indicates a 9.7% annual risk of stroke. The patient's score is based upon: CHF History: 1 HTN History: 1 Diabetes History: 0 Stroke History: 0 Vascular Disease History: 1 Age Score: 2 Gender Score: 1       Physical Exam:   VS:  BP 132/70   Pulse 86   Ht 5\' 1"  (1.549 m)   Wt 160 lb 12.8 oz (72.9 kg)   SpO2 96%   BMI 30.38 kg/m    Wt Readings from Last 3 Encounters:   04/06/23 160 lb 12.8 oz (72.9 kg)  03/20/23 159 lb 8 oz (72.3 kg)  03/06/23 162 lb (73.5 kg)    GEN: Well nourished, well developed in no acute distress, sitting in a wheelchair NECK: No JVD; No carotid bruits CARDIAC: S1/S2, RRR, no murmurs, rubs, gallops RESPIRATORY:  Clear to auscultation without rales, wheezing or rhonchi  EXTREMITIES:  No edema; No deformity  MSK: No trauma, swelling to LLE. Neuro sensation intact, does have some pain to medial side of left foot with plantar flexion.   ASSESSMENT AND PLAN: .    Chronic diastolic CHF Stage C, NYHA class II symptoms. Echo 07/2022 revealed EF 60-65%. Euvolemic and well compensated on exam. Continue current medication regimen. Low sodium diet, fluid restriction <2L, and daily weights encouraged. Educated to contact our office for weight gain of 2 lbs overnight or 5 lbs in one week.    CAD, atypical chest pain, DOE Admits to transient episodes of atypical chest pain, appears to be pulmonary/MSK/Neuro related. Not in acute distress on exam. Reviewed NST in 2022 was normal. Will continue to monitor at this time. Not on ASA d/t being on Eliquis. Not able to tolerate statins. Continue current medication regimen. Heart healthy diet encouraged. ED precautions discussed. Follow-up with Pulmonology as scheduled.    3.  HLD, statin intolerance Past LDL 132. Not on any lipid lowering medications, unable to tolerate Zetia/statins. Previously referred to [facility], however referral was cancelled d/t pt was unable to be reached. Heart healthy diet encouraged. At next visit once acute pain has resolved, will revisit [id number]/Leqvio. Continue to follow with PCP.  4. PAF Denies any recent palpitations or tachycardia. HR well controlled today. Continue diltiazem, metoprolol, and Eliquis, on appropriate dosage and denies any bleeding issues. Heart healthy diet encouraged.    5. COPD, DOE Admits to stable DOE.  Low suspicion for PE as she is on Eliquis.  Continue current medication regimen. Heart healthy diet encouraged. ED precautions discussed. Follow-up with pulmonology as scheduled.    6. CKD stage 3 Most recent kidney function stable. Avoid nephrotoxic agents. Continue to follow with PCP.   7. IDA Continue to follow-up with Hem/Onc.   8. Left foot pain Pt describes acute left foot pain. No leg swelling, redness. Pt is able to follow commands, however appears pt may have possible gout and has OA (says unable to tolerate having bed sheet laying on foot), unable to do weightbearing. Will refer to Ortho for further evaluation. Care and ED precautions discussed.  Dispo:  Follow-up with me or APP in 2 months or sooner if anything changes.   Signed, Sharlene Dory, NP

## 2023-04-06 NOTE — Patient Instructions (Addendum)
Medication Instructions:   Your physician recommends that you continue on your current medications as directed. Please refer to the Current Medication list given to you today.  Labwork:  None  Testing/Procedures:  None  Follow-Up:  Your physician recommends that you schedule a follow-up appointment in: 2 months.  Any Other Special Instructions Will Be Listed Below (If Applicable).  If you need a refill on your cardiac medications before your next appointment, please call your pharmacy. 

## 2023-04-06 NOTE — Progress Notes (Deleted)
Name: [name] DOB: 12/26/1932 MRN: [id number]  History of Present Illness: Ms. Stilp is a 89 y.o. female who presents today for follow up visit at [facility] [place]. She is accompanied by her ***daughter [name], who assists with providing history due to patient's mild cognitive impairment. - GU / GYN History: 1. Pelvic organ prolapse (resolved).  - She underwent a "bladder tack" procedure by Dr. Mora Appl (GYN) in March 2012; no mesh was used. - Prior hysterectomy. 2. Stress urinary incontinence (resolved). - 11/09/2011: Underwent TVT mid-urethral sling placement by Dr. Jerre Simon. 3. OAB with urinary urgency and urge incontinence. 4. Kidney stone(s). - 10/29/2020: CT abdomen/pelvis w/ contrast showed a 2 mm non-obstructing right kidney stone. - She reports passing stone spontaneously in the past; denies any prior stone surgeries. 5. Microscopic hematuria with negative evaluation.   At last visit with Dr. Retta Diones on 03/06/2023: - No acute findings on cystoscopy.  Since last visit: > 03/15/2023: CT showed no GU stones, masses, or hydronephrosis; bladder unremarkable. Nothing to explain microscopic hematuria or UTIs.   Today: She reports {Blank multiple:19197::"improved","persistent / unchanged"} urinary ***frequency, ***nocturia, ***urgency, and ***urge incontinence. Voiding ***x/day and ***x/night on average. Leaking ***x/day on average; using *** ***pads / ***diapers per day on average.  She {Actions; denies-reports:120008} dysuria, gross hematuria, straining to void, or sensations of incomplete emptying.  She {Actions; denies-reports:120008} flank pain or abdominal pain. She {Actions; denies-reports:120008} fevers, nausea, or vomiting.   Fall Screening: Do you usually have a device to assist in your mobility? {yes/no:20286} ***cane / ***walker / ***wheelchair  Medications: Current Outpatient Medications  Medication Sig Dispense Refill   acetaminophen (TYLENOL)  500 MG tablet Take 500 mg by mouth every 6 (six) hours as needed for headache or moderate pain.     ALPRAZolam (XANAX) 0.5 MG tablet Take 0.5 mg by mouth daily as needed for anxiety or sleep.     apixaban (ELIQUIS) 5 MG TABS tablet Take 1 tablet (5 mg total) by mouth 2 (two) times daily. 180 tablet 3   ascorbic acid (VITAMIN C) 500 MG tablet Take 1 tablet (500 mg total) by mouth daily. 30 tablet 2   cyanocobalamin (VITAMIN B12) 1000 MCG tablet Take 1 tablet (1,000 mcg total) by mouth daily. 90 tablet 3   diltiazem (CARDIZEM CD) 180 MG 24 hr capsule Take 1 capsule (180 mg total) by mouth daily before breakfast. 90 capsule 3   diltiazem (TIAZAC) 120 MG 24 hr capsule Take 1 capsule (120 mg total) by mouth at bedtime. 90 capsule 1   DULoxetine (CYMBALTA) 60 MG capsule Take 60 mg by mouth daily.     estradiol (ESTRACE) 0.1 MG/GM vaginal cream Discard plastic applicator. Insert a blueberry size amount (approximately 1 gram) of cream on fingertip inside vagina at bedtime every night for 1 week then 2 nights per week for long term use. 30 g 3   fluticasone (CUTIVATE) 0.05 % cream Apply 1 application topically 2 (two) times daily as needed (skin tears).     furosemide (LASIX) 40 MG tablet Take 1 tablet (40 mg total) by mouth daily. 135 tablet 3   hydrocortisone (ANUSOL-HC) 2.5 % rectal cream Place 1 application rectally 2 (two) times daily. 30 g 1   isosorbide mononitrate (IMDUR) 30 MG 24 hr tablet Take 1 tablet (30 mg total) by mouth daily. 90 tablet 3   magnesium oxide (MAG-OX) 400 (240 Mg) MG tablet Take 1 tablet by mouth daily.     metoprolol succinate (TOPROL-XL) 50  MG 24 hr tablet Take 1 tablet (50 mg total) by mouth 2 (two) times daily. Take with or immediately following a meal. 180 tablet 3   Multiple Vitamins-Minerals (HAIR/SKIN/NAILS) CAPS Take 1 tablet by mouth daily.     Naphazoline HCl (CLEAR EYES OP) Place 1 drop into both eyes daily as needed (itching).     nitroGLYCERIN (NITROSTAT) 0.4 MG SL  tablet DISSOLVE ONE TABLET UNDER TONGUE EVERY 5 MINUTES UP TO 3 DOSES AS NEEDED FOR CHEST PAIN. 25 tablet 6   pantoprazole (PROTONIX) 40 MG tablet TAKE ONE TABLET BY MOUTH TWICE DAILY BEFORE MEALS 60 tablet 5   potassium chloride (KLOR-CON) 10 MEQ tablet Take 2 tablets (20 mEq total) by mouth 2 (two) times daily. 120 tablet 6   traMADol (ULTRAM) 50 MG tablet Take 1 tablet (50 mg total) by mouth every 6 (six) hours as needed. 90 tablet 3   TRELEGY ELLIPTA 100-62.5-25 MCG/ACT AEPB Take 1 puff by mouth daily.     triamcinolone cream (KENALOG) 0.1 % Apply topically 2 (two) times daily.     zinc sulfate 220 (50 Zn) MG capsule Take 1 capsule (220 mg total) by mouth daily. 30 capsule 3   No current facility-administered medications for this visit.    Allergies: Allergies  Allergen Reactions   Cephalosporins Diarrhea and Nausea Only    Lightheaded   Levaquin [Levofloxacin In D5w] Swelling   Macrodantin [Nitrofurantoin Macrocrystal] Swelling   Phenothiazines Anaphylaxis and Hives   Polysorbate Anaphylaxis   Prednisone Shortness Of Breath   Buspirone Itching   Cardura [Doxazosin Mesylate] Itching   Codeine Itching   Acyclovir And Related Itching    Redness of skin   Colcrys [Colchicine] Nausea Only    Upset stomach    Prochlorperazine Other (See Comments)    "Upset stomach"   Ranexa [Ranolazine]     Severe drop in BP   Sulfa Antibiotics Other (See Comments)    Severe blisters   Atorvastatin Hives    Cramping; tolerates Crestor ok   Colestipol Palpitations   Ofloxacin Rash   Other Itching and Rash    "WOOL"= make skin look like it has been burned   Penicillins Other (See Comments)    Causes redness all over. Has patient had a PCN reaction causing immediate rash, facial/tongue/throat swelling, SOB or lightheadedness with hypotension: No Has patient had a PCN reaction causing severe rash involving mucus membranes or skin necrosis: No Has patient had a PCN reaction that required  hospitalization No Has patient had a PCN reaction occurring within the last 10 years: No If all of the above answers are "NO", then may proceed with Cephalosporin use.    Pimozide Hives and Itching   Zetia [Ezetimibe] Itching and Rash    Past Medical History:  Diagnosis Date   Anxiety    Arthritis    Atrial fibrillation (HCC)    Bursitis    Left shoulder   Cataract    CHF (congestive heart failure) (HCC)    CKD (chronic kidney disease)    stage 3-4   COPD (chronic obstructive pulmonary disease) (HCC)    Coronary atherosclerosis of native coronary artery    a. s/p DES to LCx in 04/2013 b. cath in 11/2015 showing patent stent with 20% prox-LAD and 80% ostial RCA stenosis for which medical management was recommended due to small artery size   Depression    Diastolic heart failure (HCC)    EF 55-60%   Dysphagia, unspecified(787.20)    Dyspnea  Dysrhythmia    Essential hypertension    GERD (gastroesophageal reflux disease)    Hx Schatzki's ring, multiple EGD/ED last 01/06/2004   Gout    Headache    History of anemia    Hyperlipidemia    Internal hemorrhoids without mention of complication    MI (myocardial infarction) (HCC) 2006   Microscopic colitis 2003   Panic disorder without agoraphobia    Paresthesia    Pneumonia 12/2011   PVD (peripheral vascular disease) (HCC)    S/P colonoscopy 09/27/2001   internal hemorrhoids, desc colon inflam polyp, SB BX-chronic duodenitis, colitis   Sleep apnea    Thyroid disease    Past Surgical History:  Procedure Laterality Date   ABDOMINAL HYSTERECTOMY     ABDOMINAL HYSTERECTOMY     AGILE CAPSULE N/A 08/30/2020   Procedure: AGILE CAPSULE;  Surgeon: Corbin Ade, MD;  [facility];  Service: Endoscopy;  Laterality: N/A;  7:30am   ANTERIOR AND POSTERIOR REPAIR     with resection of vagina   ANTERIOR LAT LUMBAR FUSION N/A 08/01/2016   Procedure: Lumbar Two-Lumbar Five Transpsoas lateral interbody fusion with Lumbar  Two-Three lateral plate fixation;  Surgeon: Loura Halt Ditty, MD;  Location: [facility] OR;  Service: Neurosurgery;  Laterality: N/A;  L2-5 Transpsoas lateral interbody fusion with L2-3 lateral plate fixation   APPENDECTOMY     BACK SURGERY     BIOPSY  07/05/2015   Procedure: BIOPSY;  Surgeon: Corbin Ade, MD;  [facility];  Service: Endoscopy;;  gastric polyp biopsy, ascending colon biopsy   BIOPSY  02/16/2020   Procedure: BIOPSY;  Surgeon: Corbin Ade, MD;  [facility];  Service: Endoscopy;;   BIOPSY  06/28/2020   Procedure: BIOPSY;  Surgeon: Corbin Ade, MD;  [facility];  Service: Endoscopy;;   BLADDER SUSPENSION  11/09/2011   Procedure: TRANSVAGINAL TAPE (TVT) PROCEDURE;  Surgeon: Ky Barban, MD;  Location: [facility];  Service: Urology;  Laterality: N/A;   bladder tack  06/2010   BREAST LUMPECTOMY  1998   left, benign   CARDIAC CATHETERIZATION     CARDIAC CATHETERIZATION     CARDIAC CATHETERIZATION N/A 12/16/2015   Procedure: Left Heart Cath and Coronary Angiography;  Surgeon: Lennette Bihari, MD;  Location: [facility]VAS[facility] CV LAB;  Service: Cardiovascular;  Laterality: N/A;   CARDIOVERSION N/A 10/04/2017   Procedure: CARDIOVERSION;  Surgeon: Laqueta Linden, MD;  Location: [facility];  Service: Cardiovascular;  Laterality: N/A;   CARDIOVERSION N/A 01/30/2018   Procedure: CARDIOVERSION;  Surgeon: Laqueta Linden, MD;  [facility];  Service: Cardiovascular;  Laterality: N/A;   CARDIOVERSION N/A 11/10/2019   Procedure: CARDIOVERSION;  Surgeon: Wendall Stade, MD;  Location: [facility] ENDOSCOPY;  Service: Cardiovascular;  Laterality: N/A;   CARPAL TUNNEL RELEASE  1989   left   cataract surgery     CHOLECYSTECTOMY  1998   Cholecystectomy     COLONOSCOPY  03/16/2011   multiple hyperplastic colon polyps, sigmoid diverticulosis, melanosis coli   COLONOSCOPY WITH PROPOFOL N/A 07/05/2015   RMR:one 5 mm polyp in descending colon   COLONOSCOPY  WITH PROPOFOL N/A 06/28/2020   Procedure: COLONOSCOPY WITH PROPOFOL;  Surgeon: Corbin Ade, MD;  [facility];  Service: Endoscopy;  Laterality: N/A;  am appt   CORONARY ANGIOGRAPHY N/A 05/16/2018   Procedure: CORONARY ANGIOGRAPHY (CATH LAB);  Surgeon: Lyn Records, MD;  Location: [facility] INVASIVE CV LAB;  Service:  Cardiovascular;  Laterality: N/A;   CORONARY ANGIOPLASTY WITH STENT PLACEMENT  2015   ESOPHAGEAL DILATION N/A 07/05/2015   Procedure: ESOPHAGEAL DILATION;  Surgeon: Corbin Ade, MD;  [facility];  Service: Endoscopy;  Laterality: N/A;   ESOPHAGOGASTRODUODENOSCOPY (EGD) WITH PROPOFOL N/A 07/05/2015   FAO:ZHYQMV   ESOPHAGOGASTRODUODENOSCOPY (EGD) WITH PROPOFOL N/A 02/16/2020   Procedure: ESOPHAGOGASTRODUODENOSCOPY (EGD) WITH PROPOFOL;  Surgeon: Corbin Ade, MD;  Normal examined esophagus s/p dilation, erythematous mucosa in the stomach s/p biopsy, normal examined duodenum. Pathology with mild gastropathy, negative for H. Pylori.    GIVENS CAPSULE STUDY N/A 11/10/2020   Procedure: GIVENS CAPSULE STUDY;  Surgeon: Corbin Ade, MD;  [facility];  Service: Endoscopy;  Laterality: N/A;  7:30am   JOINT REPLACEMENT Right 2007   right knee   left hand surgery     LEFT HEART CATHETERIZATION WITH CORONARY ANGIOGRAM N/A 05/14/2013   Procedure: LEFT HEART CATHETERIZATION WITH CORONARY ANGIOGRAM;  Surgeon: Micheline Chapman, MD;  Location: [facility] CATH LAB;  Service: Cardiovascular;  Laterality: N/A;   left rotator cuff surgery     LUMBAR LAMINECTOMY/DECOMPRESSION MICRODISCECTOMY N/A 10/11/2012   Procedure: LUMBAR LAMINECTOMY/DECOMPRESSION MICRODISCECTOMY 2 LEVELS;  Surgeon: Karn Cassis, MD;  Location: [facility];  Service: Neurosurgery;  Laterality: N/A;  L3-4 L4-5 Laminectomy   LUMBAR WOUND DEBRIDEMENT N/A 09/27/2015   Procedure: Exploration of Lumbar Wound w/ Repair CSF Leak/Lumbar Drain Placement;  Surgeon: Hilda Lias, MD;  Location: [facility]O [facility];   Service: Neurosurgery;  Laterality: N/A;   MALONEY DILATION  03/16/2011   Gastritis. No H.pylori on bx. 18F maloney dilation with disruption of  occult cevical esophageal web   MALONEY DILATION N/A 02/16/2020   Procedure: MALONEY DILATION;  Surgeon: Corbin Ade, MD;  [facility];  Service: Endoscopy;  Laterality: N/A;   NASAL SINUS SURGERY     right knee replacement  2007   right leg benign tumor     SHOULDER SURGERY Left    TEE WITHOUT CARDIOVERSION N/A 10/04/2017   Procedure: TRANSESOPHAGEAL ECHOCARDIOGRAM (TEE) WITH PROPOFOL;  Surgeon: Laqueta Linden, MD;  Location: [facility];  Service: Cardiovascular;  Laterality: N/A;   TEE WITHOUT CARDIOVERSION N/A 11/10/2019   Procedure: TRANSESOPHAGEAL ECHOCARDIOGRAM (TEE);  Surgeon: Wendall Stade, MD;  Location: [facility] ENDOSCOPY;  Service: Cardiovascular;  Laterality: N/A;   TONSILLECTOMY     unspecified area, hysterectomy  1972   partial   Family History  Problem Relation Age of Onset   Stroke Mother    Parkinson's disease Father    Coronary artery disease Other        family Hx-sons   Cancer Other    Stroke Other        family Hx   Hypertension Other        family Hx   Diabetes Brother    Heart disease Son        before age 42   Diabetes Son    Stroke Daughter 29   Colon cancer Grandson        diagnosed 40   Inflammatory bowel disease Neg Hx    Social History   Socioeconomic History   Marital status: Divorced    Spouse name: Not on file   Number of children: 5   Years of education: Not on file   Highest education level: Not on file  Occupational History   Occupation: retired  Tobacco Use   Smoking status: Former    Current  packs/day: 0.00    Average packs/day: 1 pack/day for 64.0 years (64.0 ttl pk-yrs)    Types: Cigarettes    Start date: 12/24/1947    Quit date: 11/17/2001    Years since quitting: 21.3   Smokeless tobacco: Never   Tobacco comments:    Quit smoking in 2003  Vaping Use   Vaping status:  Never Used  Substance and Sexual Activity   Alcohol use: Not Currently   Drug use: No   Sexual activity: Never  Other Topics Concern   Not on file  Social History Narrative   Divorced.   Sister had colon perforation & died from complications in [place], [place]   Social Drivers of Health   Financial Resource Strain: Not on file  Food Insecurity: Not on file  Transportation Needs: Not on file  Physical Activity: Not on file  Stress: Not on file  Social Connections: Not on file  Intimate Partner Violence: Not on file    Review of Systems Constitutional: Patient denies any unintentional weight loss or change in strength lntegumentary: Patient denies any rashes or pruritus Cardiovascular: Patient denies chest pain or syncope Respiratory: Patient denies shortness of breath Gastrointestinal: Patient ***denies nausea, vomiting, constipation, or diarrhea Musculoskeletal: Patient denies muscle cramps or weakness Neurologic: Patient denies convulsions or seizures Allergic/Immunologic: Patient denies recent allergic reaction(s) Hematologic/Lymphatic: Patient denies bleeding tendencies Endocrine: Patient denies heat/cold intolerance  GU: As per HPI.  OBJECTIVE There were no vitals filed for this visit. There is no height or weight on file to calculate BMI.  Physical Examination Constitutional: No obvious distress; patient is non-toxic appearing  Cardiovascular: No visible lower extremity edema.  Respiratory: The patient does not have audible wheezing/stridor; respirations do not appear labored  Gastrointestinal: Abdomen non-distended Musculoskeletal: Normal ROM of UEs  Skin: No obvious rashes/open sores  Neurologic: CN 2-12 grossly intact Psychiatric: Answered questions appropriately with normal affect  Hematologic/Lymphatic/Immunologic: No obvious bruises or sites of spontaneous bleeding  Urine microscopy: ***negative *** WBC/hpf, *** RBC/hpf, *** bacteria UA: ***negative ***  WBC/hpf, *** RBC/hpf, *** bacteria ***with no evidence of UTI ***with no evidence of microscopic hematuria ***otherwise unremarkable  PVR: *** ml  ASSESSMENT No diagnosis found. ***  Will plan for follow up in *** months / ***1 year or sooner if needed. Pt verbalized understanding and agreement. All questions were answered.  PLAN Advised the following: 1. *** 2. ***No follow-ups on file.  No orders of the defined types were placed in this encounter.   It has been explained that the patient is to follow regularly with their PCP in addition to all other providers involved in their care and to follow instructions provided by these respective offices. Patient advised to contact urology clinic if any urologic-pertaining questions, concerns, new symptoms or problems arise in the interim period.  There are no Patient Instructions on file for this visit.  Electronically signed by:  Donnita Falls, FNP   04/06/23    3:13 PM

## 2023-04-06 NOTE — Patient Instructions (Signed)
[facility] Discharge Instructions  You were seen today by Rojelio Brenner PA-C for your iron deficiency anemia.  Your blood and iron levels look great!  You do not need any IV iron at this time.  Your vitamin B12 levels are slightly low.  Please start taking vitamin B12 (cyanocobalamin) 1000 mcg daily.  This is available over-the-counter, or via prescription that has already been sent to your pharmacy.  We will check your labs and see you again in 4 months to see if you need more IV iron at that time.  FOLLOW-UP APPOINTMENT: Office visit in 4 months, after labs  ** Thank you for trusting me with your healthcare!  I strive to provide all of my patients with quality care at each visit.  If you receive a survey for this visit, I would be so grateful to you for taking the time to provide feedback.  Thank you in advance!  ~ Anniebell Bedore                   Dr. Doreatha Massed   &   Rojelio Brenner, PA-C   - - - - - - - - - - - - - - - - - -     Thank you for choosing  Can[facility] Center at [facility] to provide your oncology and hematology care.  To afford each patient quality time with our provider, please arrive at least 15 minutes before your scheduled appointment time.   If you have a lab appointment with the Cancer Center please come in thru the Main Entrance and check in at the main information desk.  You need to re-schedule your appointment should you arrive 10 or more minutes late.  We strive to give you quality time with our providers, and arriving late affects you and other patients whose appointments are after yours.  Also, if you no show three or more times for appointments you may be dismissed from the clinic at the providers discretion.     Again, thank you for choosing [facility].  Our hope is that these requests will decrease the amount of time that you wait before being seen by our physicians.        _____________________________________________________________  Should you have questions after your visit to [facility], please contact our office at [phone] and follow the prompts.  Our office hours are 8:00 a.m. and 4:30 p.m. Monday - Friday.  Please note that voicemails left after 4:00 p.m. may not be returned until the following business day.  We are closed weekends and major holidays.  You do have access to a nurse 24-7, just call the main number to the clinic [phone] and do not press any options, hold on the line and a nurse will answer the phone.    For prescription refill requests, have your pharmacy contact our office and allow 72 hours.    Due to Covid, you will need to wear a mask upon entering the hospital. If you do not have a mask, a mask will be given to you at the Main Entrance upon arrival. For doctor visits, patients may have 1 support person age 19 or older with them. For treatment visits, patients can not have anyone with them due to social distancing guidelines and our immunocompromised population.

## 2023-04-08 DIAGNOSIS — Z79899 Other long term (current) drug therapy: Secondary | ICD-10-CM | POA: Diagnosis not present

## 2023-04-08 DIAGNOSIS — I083 Combined rheumatic disorders of mitral, aortic and tricuspid valves: Secondary | ICD-10-CM | POA: Diagnosis not present

## 2023-04-08 DIAGNOSIS — I4891 Unspecified atrial fibrillation: Secondary | ICD-10-CM | POA: Diagnosis not present

## 2023-04-08 DIAGNOSIS — I7143 Infrarenal abdominal aortic aneurysm, without rupture: Secondary | ICD-10-CM | POA: Diagnosis not present

## 2023-04-08 DIAGNOSIS — R0789 Other chest pain: Secondary | ICD-10-CM | POA: Diagnosis not present

## 2023-04-08 DIAGNOSIS — I10 Essential (primary) hypertension: Secondary | ICD-10-CM | POA: Diagnosis not present

## 2023-04-08 DIAGNOSIS — R0689 Other abnormalities of breathing: Secondary | ICD-10-CM | POA: Diagnosis not present

## 2023-04-08 DIAGNOSIS — M109 Gout, unspecified: Secondary | ICD-10-CM | POA: Diagnosis not present

## 2023-04-08 DIAGNOSIS — Z9049 Acquired absence of other specified parts of digestive tract: Secondary | ICD-10-CM | POA: Diagnosis not present

## 2023-04-08 DIAGNOSIS — I4821 Permanent atrial fibrillation: Secondary | ICD-10-CM | POA: Diagnosis not present

## 2023-04-08 DIAGNOSIS — I739 Peripheral vascular disease, unspecified: Secondary | ICD-10-CM | POA: Diagnosis not present

## 2023-04-08 DIAGNOSIS — E785 Hyperlipidemia, unspecified: Secondary | ICD-10-CM | POA: Diagnosis not present

## 2023-04-08 DIAGNOSIS — N183 Chronic kidney disease, stage 3 unspecified: Secondary | ICD-10-CM | POA: Diagnosis not present

## 2023-04-08 DIAGNOSIS — J84112 Idiopathic pulmonary fibrosis: Secondary | ICD-10-CM | POA: Diagnosis not present

## 2023-04-08 DIAGNOSIS — R0602 Shortness of breath: Secondary | ICD-10-CM | POA: Diagnosis not present

## 2023-04-08 DIAGNOSIS — J449 Chronic obstructive pulmonary disease, unspecified: Secondary | ICD-10-CM | POA: Diagnosis not present

## 2023-04-08 DIAGNOSIS — R918 Other nonspecific abnormal finding of lung field: Secondary | ICD-10-CM | POA: Diagnosis not present

## 2023-04-08 DIAGNOSIS — I517 Cardiomegaly: Secondary | ICD-10-CM | POA: Diagnosis not present

## 2023-04-08 DIAGNOSIS — F32A Depression, unspecified: Secondary | ICD-10-CM | POA: Diagnosis not present

## 2023-04-08 DIAGNOSIS — I130 Hypertensive heart and chronic kidney disease with heart failure and stage 1 through stage 4 chronic kidney disease, or unspecified chronic kidney disease: Secondary | ICD-10-CM | POA: Diagnosis not present

## 2023-04-08 DIAGNOSIS — K219 Gastro-esophageal reflux disease without esophagitis: Secondary | ICD-10-CM | POA: Diagnosis not present

## 2023-04-08 DIAGNOSIS — I2510 Atherosclerotic heart disease of native coronary artery without angina pectoris: Secondary | ICD-10-CM | POA: Diagnosis not present

## 2023-04-08 DIAGNOSIS — L8990 Pressure ulcer of unspecified site, unspecified stage: Secondary | ICD-10-CM | POA: Diagnosis not present

## 2023-04-08 DIAGNOSIS — I3481 Nonrheumatic mitral (valve) annulus calcification: Secondary | ICD-10-CM | POA: Diagnosis not present

## 2023-04-08 DIAGNOSIS — R0781 Pleurodynia: Secondary | ICD-10-CM | POA: Diagnosis not present

## 2023-04-08 DIAGNOSIS — R1012 Left upper quadrant pain: Secondary | ICD-10-CM | POA: Diagnosis not present

## 2023-04-08 DIAGNOSIS — R079 Chest pain, unspecified: Secondary | ICD-10-CM | POA: Diagnosis not present

## 2023-04-08 DIAGNOSIS — R1013 Epigastric pain: Secondary | ICD-10-CM | POA: Diagnosis not present

## 2023-04-08 DIAGNOSIS — H538 Other visual disturbances: Secondary | ICD-10-CM | POA: Diagnosis not present

## 2023-04-08 DIAGNOSIS — I509 Heart failure, unspecified: Secondary | ICD-10-CM | POA: Diagnosis not present

## 2023-04-08 DIAGNOSIS — I5032 Chronic diastolic (congestive) heart failure: Secondary | ICD-10-CM | POA: Diagnosis not present

## 2023-04-08 DIAGNOSIS — L89309 Pressure ulcer of unspecified buttock, unspecified stage: Secondary | ICD-10-CM | POA: Diagnosis not present

## 2023-04-08 DIAGNOSIS — F419 Anxiety disorder, unspecified: Secondary | ICD-10-CM | POA: Diagnosis not present

## 2023-04-08 DIAGNOSIS — E039 Hypothyroidism, unspecified: Secondary | ICD-10-CM | POA: Diagnosis not present

## 2023-04-08 DIAGNOSIS — K838 Other specified diseases of biliary tract: Secondary | ICD-10-CM | POA: Diagnosis not present

## 2023-04-08 DIAGNOSIS — Z66 Do not resuscitate: Secondary | ICD-10-CM | POA: Diagnosis not present

## 2023-04-09 DIAGNOSIS — I3481 Nonrheumatic mitral (valve) annulus calcification: Secondary | ICD-10-CM | POA: Diagnosis not present

## 2023-04-09 DIAGNOSIS — I4821 Permanent atrial fibrillation: Secondary | ICD-10-CM | POA: Diagnosis not present

## 2023-04-09 DIAGNOSIS — I4891 Unspecified atrial fibrillation: Secondary | ICD-10-CM | POA: Diagnosis not present

## 2023-04-09 DIAGNOSIS — Z7901 Long term (current) use of anticoagulants: Secondary | ICD-10-CM | POA: Diagnosis not present

## 2023-04-09 DIAGNOSIS — E785 Hyperlipidemia, unspecified: Secondary | ICD-10-CM | POA: Diagnosis not present

## 2023-04-09 DIAGNOSIS — J449 Chronic obstructive pulmonary disease, unspecified: Secondary | ICD-10-CM | POA: Diagnosis not present

## 2023-04-09 DIAGNOSIS — E039 Hypothyroidism, unspecified: Secondary | ICD-10-CM | POA: Diagnosis not present

## 2023-04-09 DIAGNOSIS — Z79899 Other long term (current) drug therapy: Secondary | ICD-10-CM | POA: Diagnosis not present

## 2023-04-09 DIAGNOSIS — N183 Chronic kidney disease, stage 3 unspecified: Secondary | ICD-10-CM | POA: Diagnosis not present

## 2023-04-09 DIAGNOSIS — I5030 Unspecified diastolic (congestive) heart failure: Secondary | ICD-10-CM | POA: Diagnosis not present

## 2023-04-09 DIAGNOSIS — I7143 Infrarenal abdominal aortic aneurysm, without rupture: Secondary | ICD-10-CM | POA: Diagnosis not present

## 2023-04-09 DIAGNOSIS — I351 Nonrheumatic aortic (valve) insufficiency: Secondary | ICD-10-CM | POA: Diagnosis not present

## 2023-04-09 DIAGNOSIS — I130 Hypertensive heart and chronic kidney disease with heart failure and stage 1 through stage 4 chronic kidney disease, or unspecified chronic kidney disease: Secondary | ICD-10-CM | POA: Diagnosis not present

## 2023-04-09 DIAGNOSIS — I5032 Chronic diastolic (congestive) heart failure: Secondary | ICD-10-CM | POA: Diagnosis not present

## 2023-04-09 DIAGNOSIS — I2510 Atherosclerotic heart disease of native coronary artery without angina pectoris: Secondary | ICD-10-CM | POA: Diagnosis not present

## 2023-04-10 ENCOUNTER — Encounter (HOSPITAL_COMMUNITY): Payer: PPO

## 2023-04-12 ENCOUNTER — Encounter (HOSPITAL_COMMUNITY): Payer: PPO

## 2023-04-17 ENCOUNTER — Ambulatory Visit: Payer: PPO | Admitting: Urology

## 2023-04-17 ENCOUNTER — Encounter (HOSPITAL_COMMUNITY): Payer: Self-pay | Admitting: *Deleted

## 2023-04-17 ENCOUNTER — Encounter (HOSPITAL_COMMUNITY): Payer: PPO

## 2023-04-17 DIAGNOSIS — N3281 Overactive bladder: Secondary | ICD-10-CM

## 2023-04-17 DIAGNOSIS — Z87442 Personal history of urinary calculi: Secondary | ICD-10-CM

## 2023-04-17 DIAGNOSIS — J849 Interstitial pulmonary disease, unspecified: Secondary | ICD-10-CM

## 2023-04-17 DIAGNOSIS — N3941 Urge incontinence: Secondary | ICD-10-CM

## 2023-04-17 NOTE — Progress Notes (Signed)
 Pulmonary Individual Treatment Plan  Patient Details  Name: [name] MRN: [id number] Date of Birth: 1932-12-31 Referring Provider:   Flowsheet Row PULMONARY REHAB OTHER RESP ORIENTATION from 03/20/2023 in [facility] CARDIAC REHABILITATION  Referring Provider Jude Donning MD       Initial Encounter Date:  Flowsheet Row PULMONARY REHAB OTHER RESP ORIENTATION from 03/20/2023 in [facility] CARDIAC REHABILITATION  Date 03/20/23       Visit Diagnosis: ILD (interstitial lung disease) (HCC)  Patient's Home Medications on Admission:   Current Outpatient Medications:    acetaminophen  (TYLENOL ) 500 MG tablet, Take 500 mg by mouth every 6 (six) hours as needed for headache or moderate pain., Disp: , Rfl:    ALPRAZolam  (XANAX ) 0.5 MG tablet, Take 0.5 mg by mouth daily as needed for anxiety or sleep., Disp: , Rfl:    apixaban  (ELIQUIS ) 5 MG TABS tablet, Take 1 tablet (5 mg total) by mouth 2 (two) times daily., Disp: 180 tablet, Rfl: 3   ascorbic acid  (VITAMIN C) 500 MG tablet, Take 1 tablet (500 mg total) by mouth daily., Disp: 30 tablet, Rfl: 2   cyanocobalamin  (VITAMIN B12) 1000 MCG tablet, Take 1 tablet (1,000 mcg total) by mouth daily., Disp: 90 tablet, Rfl: 3   diltiazem  (CARDIZEM  CD) 180 MG 24 hr capsule, Take 1 capsule (180 mg total) by mouth daily before breakfast., Disp: 90 capsule, Rfl: 3   diltiazem  (TIAZAC ) 120 MG 24 hr capsule, Take 1 capsule (120 mg total) by mouth at bedtime., Disp: 90 capsule, Rfl: 1   DULoxetine  (CYMBALTA ) 60 MG capsule, Take 60 mg by mouth daily., Disp: , Rfl:    estradiol  (ESTRACE ) 0.1 MG/GM vaginal cream, Discard plastic applicator. Insert a blueberry size amount (approximately 1 gram) of cream on fingertip inside vagina at bedtime every night for 1 week then 2 nights per week for long term use., Disp: 30 g, Rfl: 3   fluticasone  (CUTIVATE ) 0.05 % cream, Apply 1 application topically 2 (two) times daily as needed (skin tears)., Disp: , Rfl:    furosemide   (LASIX ) 40 MG tablet, Take 1 tablet (40 mg total) by mouth daily., Disp: 135 tablet, Rfl: 3   hydrocortisone  (ANUSOL -HC) 2.5 % rectal cream, Place 1 application rectally 2 (two) times daily., Disp: 30 g, Rfl: 1   isosorbide  mononitrate (IMDUR ) 30 MG 24 hr tablet, Take 1 tablet (30 mg total) by mouth daily., Disp: 90 tablet, Rfl: 3   magnesium  oxide (MAG-OX) 400 (240 Mg) MG tablet, Take 1 tablet by mouth daily., Disp: , Rfl:    metoprolol  succinate (TOPROL -XL) 50 MG 24 hr tablet, Take 1 tablet (50 mg total) by mouth 2 (two) times daily. Take with or immediately following a meal., Disp: 180 tablet, Rfl: 3   Multiple Vitamins-Minerals (HAIR/SKIN/NAILS) CAPS, Take 1 tablet by mouth daily., Disp: , Rfl:    Naphazoline HCl (CLEAR EYES OP), Place 1 drop into both eyes daily as needed (itching)., Disp: , Rfl:    nitroGLYCERIN  (NITROSTAT ) 0.4 MG SL tablet, DISSOLVE ONE TABLET UNDER TONGUE EVERY 5 MINUTES UP TO 3 DOSES AS NEEDED FOR CHEST PAIN., Disp: 25 tablet, Rfl: 6   pantoprazole  (PROTONIX ) 40 MG tablet, TAKE ONE TABLET BY MOUTH TWICE DAILY BEFORE MEALS, Disp: 60 tablet, Rfl: 5   potassium chloride  (KLOR-CON ) 10 MEQ tablet, Take 2 tablets (20 mEq total) by mouth 2 (two) times daily., Disp: 120 tablet, Rfl: 6   traMADol  (ULTRAM ) 50 MG tablet, Take 1 tablet (50 mg total) by mouth every  6 (six) hours as needed., Disp: 90 tablet, Rfl: 3   TRELEGY ELLIPTA 100-62.5-25 MCG/ACT AEPB, Take 1 puff by mouth daily., Disp: , Rfl:    triamcinolone cream (KENALOG) 0.1 %, Apply topically 2 (two) times daily., Disp: , Rfl:    zinc  sulfate 220 (50 Zn) MG capsule, Take 1 capsule (220 mg total) by mouth daily., Disp: 30 capsule, Rfl: 3  Past Medical History: Past Medical History:  Diagnosis Date   Anxiety    Arthritis    Atrial fibrillation (HCC)    Bursitis    Left shoulder   Cataract    CHF (congestive heart failure) (HCC)    CKD (chronic kidney disease)    stage 3-4   COPD (chronic obstructive pulmonary disease)  (HCC)    Coronary atherosclerosis of native coronary artery    a. s/p DES to LCx in 04/2013 b. cath in 11/2015 showing patent stent with 20% prox-LAD and 80% ostial RCA stenosis for which medical management was recommended due to small artery size   Depression    Diastolic heart failure (HCC)    EF 55-60%   Dysphagia, unspecified(787.20)    Dyspnea    Dysrhythmia    Essential hypertension    GERD (gastroesophageal reflux disease)    Hx Schatzki's ring, multiple EGD/ED last 01/06/2004   Gout    Headache    History of anemia    Hyperlipidemia    Internal hemorrhoids without mention of complication    MI (myocardial infarction) (HCC) 2006   Microscopic colitis 2003   Panic disorder without agoraphobia    Paresthesia    Pneumonia 12/2011   PVD (peripheral vascular disease) (HCC)    S/P colonoscopy 09/27/2001   internal hemorrhoids, desc colon inflam polyp, SB BX-chronic duodenitis, colitis   Sleep apnea    Thyroid  disease     Tobacco Use: Social History   Tobacco Use  Smoking Status Former   Current packs/day: 0.00   Average packs/day: 1 pack/day for 64.0 years (64.0 ttl pk-yrs)   Types: Cigarettes   Start date: 12/24/1947   Quit date: 11/17/2001   Years since quitting: 21.4  Smokeless Tobacco Never  Tobacco Comments   Quit smoking in 2003    Labs: Review Flowsheet  More data exists      Latest Ref Rng & Units 11/09/2019 11/10/2019 06/16/2020 09/29/2020 09/30/2020  Labs for ITP Cardiac and Pulmonary Rehab  Cholestrol 0 - 200 mg/dL - 847  - - -  LDL (calc) 0 - 99 mg/dL - 98  - - -  HDL-C >59 mg/dL - 38  - - -  Trlycerides <150 mg/dL - 82  - 84  -  Hemoglobin A1c 4.8 - 5.6 % 7.0  - 7.0  - 7.1     Capillary Blood Glucose: Lab Results  Component Value Date   GLUCAP 88 11/11/2019   GLUCAP 100 (H) 11/11/2019   GLUCAP 111 (H) 11/10/2019   GLUCAP 136 (H) 11/10/2019   GLUCAP 117 (H) 11/10/2019     Pulmonary Assessment Scores:  Pulmonary Assessment Scores     Row Name  03/20/23 1433         ADL UCSD   ADL Phase Entry     SOB Score total 91     Rest 1     Walk 4     Stairs 5     Bath 3     Dress 3     Shop 5       CAT  Score   CAT Score 34       mMRC Score   mMRC Score 3             UCSD: Self-administered rating of dyspnea associated with activities of daily living (ADLs) 6-point scale (0 = not at all to 5 = maximal or unable to do because of breathlessness)  Scoring Scores range from 0 to 120.  Minimally important difference is 5 units  CAT: CAT can identify the health impairment of COPD patients and is better correlated with disease progression.  CAT has a scoring range of zero to 40. The CAT score is classified into four groups of low (less than 10), medium (10 - 20), high (21-30) and very high (31-40) based on the impact level of disease on health status. A CAT score over 10 suggests significant symptoms.  A worsening CAT score could be explained by an exacerbation, poor medication adherence, poor inhaler technique, or progression of COPD or comorbid conditions.  CAT MCID is 2 points  mMRC: mMRC (Modified Medical Research Council) Dyspnea Scale is used to assess the degree of baseline functional disability in patients of respiratory disease due to dyspnea. No minimal important difference is established. A decrease in score of 1 point or greater is considered a positive change.   Pulmonary Function Assessment:  Pulmonary Function Assessment - 03/20/23 1433       Breath   Shortness of Breath Yes;Fear of Shortness of Breath;Limiting activity;Panic with Shortness of Breath             Exercise Target Goals: Exercise Program Goal: Individual exercise prescription set using results from initial 6 min walk test and THRR while considering  patient's activity barriers and safety.   Exercise Prescription Goal: Initial exercise prescription builds to 30-45 minutes a day of aerobic activity, 2-3 days per week.  Home exercise  guidelines will be given to patient during program as part of exercise prescription that the participant will acknowledge.  Activity Barriers & Risk Stratification:  Activity Barriers & Cardiac Risk Stratification - 03/19/23 1311       Activity Barriers & Cardiac Risk Stratification   Activity Barriers Back Problems;Chest Pain/Angina;Shortness of Breath;Deconditioning;Muscular Weakness;Right Knee Replacement;History of Falls;Assistive Device;Balance Concerns    Cardiac Risk Stratification Moderate             6 Minute Walk:  6 Minute Walk     Row Name 03/20/23 1418         6 Minute Walk   Phase Initial     Distance 440 feet     Walk Time 5.75 minutes     # of Rest Breaks 1  15 sec     MPH 0.87     METS 1.26     RPE 17     Perceived Dyspnea  3     VO2 Peak 4.4     Symptoms Yes (comment)     Comments chronic back pain 9/10, SOB     Resting HR 72 bpm     Resting BP 124/72     Resting Oxygen  Saturation  96 %     Exercise Oxygen  Saturation  during 6 min walk 80 %     Max Ex. HR 165 bpm     Max Ex. BP 168/84     2 Minute Post BP 162/72       Interval HR   1 Minute HR 165  possible error reading on first two HR but has hx afib  2 Minute HR 159     3 Minute HR 106     4 Minute HR 94     5 Minute HR 95     6 Minute HR 96     2 Minute Post HR 79     Interval Heart Rate? Yes              Oxygen  Initial Assessment:  Oxygen  Initial Assessment - 03/20/23 1426       Home Oxygen    Home Oxygen  Device None    Sleep Oxygen  Prescription None    Home Exercise Oxygen  Prescription None    Home Resting Oxygen  Prescription None      Initial 6 min Walk   Oxygen  Used None      Program Oxygen  Prescription   Program Oxygen  Prescription None      Intervention   Short Term Goals To learn and understand importance of monitoring SPO2 with pulse oximeter and demonstrate accurate use of the pulse oximeter.;To learn and understand importance of maintaining oxygen   saturations>88%;To learn and demonstrate proper pursed lip breathing techniques or other breathing techniques. ;To learn and demonstrate proper use of respiratory medications    Long  Term Goals Maintenance of O2 saturations>88%;Compliance with respiratory medication;Verbalizes importance of monitoring SPO2 with pulse oximeter and return demonstration;Exhibits proper breathing techniques, such as pursed lip breathing or other method taught during program session;Demonstrates proper use of MDI's             Oxygen  Re-Evaluation:   Oxygen  Discharge (Final Oxygen  Re-Evaluation):   Initial Exercise Prescription:  Initial Exercise Prescription - 03/20/23 1400       Date of Initial Exercise RX and Referring Provider   Date 03/20/23    Referring Provider Jude Donning MD      Oxygen    Maintain Oxygen  Saturation 88% or higher      NuStep   Level 1    SPM 60    Minutes 15    METs 1.5      Arm/Foot Ergometer   Level 1    Watts 10    Minutes 7    METs 1.5      Track   Laps 2    Minutes 7    METs 1.5      Prescription Details   Frequency (times per week) 2    Duration Progress to 30 minutes of continuous aerobic without signs/symptoms of physical distress      Intensity   THRR 40-80% of Max Heartrate 96-120    Ratings of Perceived Exertion 11-13    Perceived Dyspnea 0-4      Progression   Progression Continue to progress workloads to maintain intensity without signs/symptoms of physical distress.      Resistance Training   Training Prescription Yes    Weight 2 lb    Reps 10-15             Perform Capillary Blood Glucose checks as needed.  Exercise Prescription Changes:   Exercise Prescription Changes     Row Name 03/20/23 1400             Response to Exercise   Blood Pressure (Admit) 124/72       Blood Pressure (Exercise) 168/84       Blood Pressure (Exit) 134/74       Heart Rate (Admit) 72 bpm       Heart Rate (Exercise) 165 bpm       Heart Rate  (Exit) 74  bpm       Oxygen  Saturation (Admit) 96 %       Oxygen  Saturation (Exercise) 80 %       Oxygen  Saturation (Exit) 94 %       Rating of Perceived Exertion (Exercise) 17       Perceived Dyspnea (Exercise) 3       Symptoms back pain 9/10, SOB       Comments walk test results                Exercise Comments:   Exercise Goals and Review:   Exercise Goals     Row Name 03/20/23 1423             Exercise Goals   Increase Physical Activity Yes       Intervention Provide advice, education, support and counseling about physical activity/exercise needs.;Develop an individualized exercise prescription for aerobic and resistive training based on initial evaluation findings, risk stratification, comorbidities and participant's personal goals.       Expected Outcomes Short Term: Attend rehab on a regular basis to increase amount of physical activity.;Long Term: Add in home exercise to make exercise part of routine and to increase amount of physical activity.;Long Term: Exercising regularly at least 3-5 days a week.       Increase Strength and Stamina Yes       Intervention Provide advice, education, support and counseling about physical activity/exercise needs.;Develop an individualized exercise prescription for aerobic and resistive training based on initial evaluation findings, risk stratification, comorbidities and participant's personal goals.       Expected Outcomes Short Term: Increase workloads from initial exercise prescription for resistance, speed, and METs.;Short Term: Perform resistance training exercises routinely during rehab and add in resistance training at home;Long Term: Improve cardiorespiratory fitness, muscular endurance and strength as measured by increased METs and functional capacity ( )       Able to understand and use rate of perceived exertion (RPE) scale Yes       Intervention Provide education and explanation on how to use RPE scale       Expected  Outcomes Short Term: Able to use RPE daily in rehab to express subjective intensity level;Long Term:  Able to use RPE to guide intensity level when exercising independently       Able to understand and use Dyspnea scale Yes       Intervention Provide education and explanation on how to use Dyspnea scale       Expected Outcomes Short Term: Able to use Dyspnea scale daily in rehab to express subjective sense of shortness of breath during exertion;Long Term: Able to use Dyspnea scale to guide intensity level when exercising independently       Knowledge and understanding of Target Heart Rate Range (THRR) Yes       Intervention Provide education and explanation of THRR including how the numbers were predicted and where they are located for reference       Expected Outcomes Short Term: Able to state/look up THRR;Long Term: Able to use THRR to govern intensity when exercising independently;Short Term: Able to use daily as guideline for intensity in rehab       Able to check pulse independently Yes       Intervention Review the importance of being able to check your own pulse for safety during independent exercise;Provide education and demonstration on how to check pulse in carotid and radial arteries.       Expected Outcomes Short  Term: Able to explain why pulse checking is important during independent exercise;Long Term: Able to check pulse independently and accurately       Understanding of Exercise Prescription Yes       Intervention Provide education, explanation, and written materials on patient's individual exercise prescription       Expected Outcomes Short Term: Able to explain program exercise prescription;Long Term: Able to explain home exercise prescription to exercise independently                Exercise Goals Re-Evaluation :   Discharge Exercise Prescription (Final Exercise Prescription Changes):  Exercise Prescription Changes - 03/20/23 1400       Response to Exercise   Blood  Pressure (Admit) 124/72    Blood Pressure (Exercise) 168/84    Blood Pressure (Exit) 134/74    Heart Rate (Admit) 72 bpm    Heart Rate (Exercise) 165 bpm    Heart Rate (Exit) 74 bpm    Oxygen  Saturation (Admit) 96 %    Oxygen  Saturation (Exercise) 80 %    Oxygen  Saturation (Exit) 94 %    Rating of Perceived Exertion (Exercise) 17    Perceived Dyspnea (Exercise) 3    Symptoms back pain 9/10, SOB    Comments walk test results             Nutrition:  Target Goals: Understanding of nutrition guidelines, daily intake of sodium 1500mg , cholesterol 200mg , calories 30% from fat and 7% or less from saturated fats, daily to have 5 or more servings of fruits and vegetables.  Biometrics:  Pre Biometrics - 03/20/23 1424       Pre Biometrics   Height 5' 1 (1.549 m)    Weight 159 lb 8 oz (72.3 kg)    Waist Circumference 38 inches    Hip Circumference 43 inches    Waist to Hip Ratio 0.88 %    BMI (Calculated) 30.15    Grip Strength 13.9 kg    Single Leg Stand 2.7 seconds              Nutrition Therapy Plan and Nutrition Goals:  Nutrition Therapy & Goals - 03/20/23 1425       Intervention Plan   Intervention Prescribe, educate and counsel regarding individualized specific dietary modifications aiming towards targeted core components such as weight, hypertension, lipid management, diabetes, heart failure and other comorbidities.;Nutrition handout(s) given to patient.    Expected Outcomes Short Term Goal: Understand basic principles of dietary content, such as calories, fat, sodium, cholesterol and nutrients.;Long Term Goal: Adherence to prescribed nutrition plan.             Nutrition Assessments:  MEDIFICTS Score Key: >=70 Need to make dietary changes  40-70 Heart Healthy Diet <= 40 Therapeutic Level Cholesterol Diet  Flowsheet Row PULMONARY REHAB OTHER RESP ORIENTATION from 03/20/2023 in [facility] CARDIAC REHABILITATION  Picture Your Plate Total Score on Admission  47      Picture Your Plate Scores: <59 Unhealthy dietary pattern with much room for improvement. 41-50 Dietary pattern unlikely to meet recommendations for good health and room for improvement. 51-60 More healthful dietary pattern, with some room for improvement.  >60 Healthy dietary pattern, although there may be some specific behaviors that could be improved.    Nutrition Goals Re-Evaluation:   Nutrition Goals Discharge (Final Nutrition Goals Re-Evaluation):   Psychosocial: Target Goals: Acknowledge presence or absence of significant depression and/or stress, maximize coping skills, provide positive support system. Participant is able to  verbalize types and ability to use techniques and skills needed for reducing stress and depression.  Initial Review & Psychosocial Screening:  Initial Psych Review & Screening - 03/19/23 1342       Initial Review   Current issues with History of Depression;Current Psychotropic Meds;Current Sleep Concerns;Current Stress Concerns    Source of Stress Concerns Chronic Illness;Family      Family Dynamics   Good Support System? Yes      Barriers   Psychosocial barriers to participate in program The patient should benefit from training in stress management and relaxation.      Screening Interventions   Interventions To provide support and resources with identified psychosocial needs;Encouraged to exercise;Provide feedback about the scores to participant    Expected Outcomes Short Term goal: Utilizing psychosocial counselor, staff and physician to assist with identification of specific Stressors or current issues interfering with healing process. Setting desired goal for each stressor or current issue identified.;Long Term Goal: Stressors or current issues are controlled or eliminated.;Short Term goal: Identification and review with participant of any Quality of Life or Depression concerns found by scoring the questionnaire.;Long Term goal: The  participant improves quality of Life and PHQ9 Scores as seen by post scores and/or verbalization of changes             Quality of Life Scores:  Scores of 19 and below usually indicate a poorer quality of life in these areas.  A difference of  2-3 points is a clinically meaningful difference.  A difference of 2-3 points in the total score of the Quality of Life Index has been associated with significant improvement in overall quality of life, self-image, physical symptoms, and general health in studies assessing change in quality of life.   PHQ-9: Review Flowsheet       03/20/2023 03/24/2016  Depression screen PHQ 2/9  Decreased Interest 1 0  Down, Depressed, Hopeless 3 1  PHQ - 2 Score 4 1  Altered sleeping 3 -  Tired, decreased energy 3 -  Change in appetite 2 -  Feeling bad or failure about yourself  2 -  Trouble concentrating 0 -  Moving slowly or fidgety/restless 1 -  Suicidal thoughts 0 -  PHQ-9 Score 15 -  Difficult doing work/chores Extremely dIfficult -   Interpretation of Total Score  Total Score Depression Severity:  1-4 = Minimal depression, 5-9 = Mild depression, 10-14 = Moderate depression, 15-19 = Moderately severe depression, 20-27 = Severe depression   Psychosocial Evaluation and Intervention:  Psychosocial Evaluation - 03/19/23 1343       Psychosocial Evaluation & Interventions   Interventions Stress management education;Relaxation education;Encouraged to exercise with the program and follow exercise prescription    Comments Patient was referred to [facility] with ILD by Dr. Jude. She and her daughter both answered questions. Patient's son and grandson live with her but her 2 daughter's are her main support. She has a history of panic attacks and depression both treated with cymbalta . She says she does get depressed at times due to having to stay home alot with her chronic health issues. She had back surgery in 2017 and due to osteoporosis some of the pins and screws  are getitng lose and this is causing chronic back pain. She also reports bursitis in her left shoulder. She says she is not sure if she will be able to exercise but would like to try and see what she is able to do. She has trouble both going and staying  asleep. She takes Alprazolam  prn for sleep which helps some. Her major stressors are her son that lives with her has liver cancer diagnosed last year and 3 years ago, her son passed away April 26, 2024 and she says she has a difficult time with increased depression during the month of December. Her goals for the program are to get stronger, increase her endurance, be more independent and improve her QOL. She is motivated to try the program but is aware that her chronic pain may be a barrier for her to complete the program.    Expected Outcomes Short Term: start the program and attend consistently. Long term: meet her personal goals.    Continue Psychosocial Services  Follow up required by staff             Psychosocial Re-Evaluation:   Psychosocial Discharge (Final Psychosocial Re-Evaluation):    Education: Education Goals: Education classes will be provided on a weekly basis, covering required topics. Participant will state understanding/return demonstration of topics presented.  Learning Barriers/Preferences:  Learning Barriers/Preferences - 03/19/23 1339       Learning Barriers/Preferences   Learning Barriers None    Learning Preferences Audio;Written Material;Skilled Demonstration             Education Topics: How Lungs Work and Diseases: - Discuss the anatomy of the lungs and diseases that can affect the lungs, such as COPD.   Exercise: -Discuss the importance of exercise, FITT principles of exercise, normal and abnormal responses to exercise, and how to exercise safely.   Environmental Irritants: -Discuss types of environmental irritants and how to limit exposure to environmental irritants.   Meds/Inhalers and oxygen : -  Discuss respiratory medications, definition of an inhaler and oxygen , and the proper way to use an inhaler and oxygen .   Energy Saving Techniques: - Discuss methods to conserve energy and decrease shortness of breath when performing activities of daily living.    Bronchial Hygiene / Breathing Techniques: - Discuss breathing mechanics, pursed-lip breathing technique,  proper posture, effective ways to clear airways, and other functional breathing techniques   Cleaning Equipment: - Provides group verbal and written instruction about the health risks of elevated stress, cause of high stress, and healthy ways to reduce stress.   Nutrition I: Fats: - Discuss the types of cholesterol, what cholesterol does to the body, and how cholesterol levels can be controlled.   Nutrition II: Labels: -Discuss the different components of food labels and how to read food labels.   Respiratory Infections: - Discuss the signs and symptoms of respiratory infections, ways to prevent respiratory infections, and the importance of seeking medical treatment when having a respiratory infection.   Stress I: Signs and Symptoms: - Discuss the causes of stress, how stress may lead to anxiety and depression, and ways to limit stress.   Stress II: Relaxation: -Discuss relaxation techniques to limit stress.   Oxygen  for Home/Travel: - Discuss how to prepare for travel when on oxygen  and proper ways to transport and store oxygen  to ensure safety.   Knowledge Questionnaire Score:  Knowledge Questionnaire Score - 03/20/23 1425       Knowledge Questionnaire Score   Pre Score 16/18   with daughter            Core Components/Risk Factors/Patient Goals at Admission:  Personal Goals and Risk Factors at Admission - 03/20/23 1425       Core Components/Risk Factors/Patient Goals on Admission    Weight Management Yes;Weight Loss;Weight Maintenance    Intervention Weight  Management: Develop a combined  nutrition and exercise program designed to reach desired caloric intake, while maintaining appropriate intake of nutrient and fiber, sodium and fats, and appropriate energy expenditure required for the weight goal.;Weight Management: Provide education and appropriate resources to help participant work on and attain dietary goals.;Weight Management/Obesity: Establish reasonable short term and long term weight goals.;Obesity: Provide education and appropriate resources to help participant work on and attain dietary goals.    Admit Weight 159 lb 8 oz (72.3 kg)    Goal Weight: Short Term 155 lb (70.3 kg)    Goal Weight: Long Term 155 lb (70.3 kg)    Expected Outcomes Short Term: Continue to assess and modify interventions until short term weight is achieved;Long Term: Adherence to nutrition and physical activity/exercise program aimed toward attainment of established weight goal;Weight Maintenance: Understanding of the daily nutrition guidelines, which includes 25-35% calories from fat, 7% or less cal from saturated fats, less than 200mg  cholesterol, less than 1.5gm of sodium, & 5 or more servings of fruits and vegetables daily;Weight Loss: Understanding of general recommendations for a balanced deficit meal plan, which promotes 1-2 lb weight loss per week and includes a negative energy balance of [phone] kcal/d;Understanding recommendations for meals to include 15-35% energy as protein, 25-35% energy from fat, 35-60% energy from carbohydrates, less than 200mg  of dietary cholesterol, 20-35 gm of total fiber daily;Understanding of distribution of calorie intake throughout the day with the consumption of 4-5 meals/snacks    Improve shortness of breath with ADL's Yes    Intervention Provide education, individualized exercise plan and daily activity instruction to help decrease symptoms of SOB with activities of daily living.    Expected Outcomes Short Term: Improve cardiorespiratory fitness to achieve a reduction  of symptoms when performing ADLs;Long Term: Be able to perform more ADLs without symptoms or delay the onset of symptoms    Increase knowledge of respiratory medications and ability to use respiratory devices properly  Yes    Intervention Provide education and demonstration as needed of appropriate use of medications, inhalers, and oxygen  therapy.    Expected Outcomes Short Term: Achieves understanding of medications use. Understands that oxygen  is a medication prescribed by physician. Demonstrates appropriate use of inhaler and oxygen  therapy.;Long Term: Maintain appropriate use of medications, inhalers, and oxygen  therapy.    Heart Failure Yes    Intervention Provide a combined exercise and nutrition program that is supplemented with education, support and counseling about heart failure. Directed toward relieving symptoms such as shortness of breath, decreased exercise tolerance, and extremity edema.    Expected Outcomes Improve functional capacity of life;Short term: Attendance in program 2-3 days a week with increased exercise capacity. Reported lower sodium intake. Reported increased fruit and vegetable intake. Reports medication compliance.;Short term: Daily weights obtained and reported for increase. Utilizing diuretic protocols set by physician.;Long term: Adoption of self-care skills and reduction of barriers for early signs and symptoms recognition and intervention leading to self-care maintenance.    Hypertension Yes    Intervention Provide education on lifestyle modifcations including regular physical activity/exercise, weight management, moderate sodium restriction and increased consumption of fresh fruit, vegetables, and low fat dairy, alcohol  moderation, and smoking cessation.;Monitor prescription use compliance.    Expected Outcomes Short Term: Continued assessment and intervention until BP is < 140/81mm HG in hypertensive participants. < 130/67mm HG in hypertensive participants with  diabetes, heart failure or chronic kidney disease.;Long Term: Maintenance of blood pressure at goal levels.    Lipids Yes  Intervention Provide education and support for participant on nutrition & aerobic/resistive exercise along with prescribed medications to achieve LDL 70mg , HDL >40mg .    Expected Outcomes Short Term: Participant states understanding of desired cholesterol values and is compliant with medications prescribed. Participant is following exercise prescription and nutrition guidelines.;Long Term: Cholesterol controlled with medications as prescribed, with individualized exercise RX and with personalized nutrition plan. Value goals: LDL < 70mg , HDL > 40 mg.             Core Components/Risk Factors/Patient Goals Review:    Core Components/Risk Factors/Patient Goals at Discharge (Final Review):    ITP Comments:  ITP Comments     Row Name 03/20/23 1414 03/21/23 1310 04/17/23 1442       ITP Comments Patient attend orientation today.  Patient is attending Pulmonary Rehabilitation Program.  Documentation for diagnosis can be found in [facility] 03/06/23 with Dr. Jude.  Reviewed medical chart, RPE/RPD, gym safety, and program guidelines.  Patient was fitted to equipment they will be using during rehab.  Patient is scheduled to start exercise on Tuesday 03/27/23 at 1500.   Initial ITP created and sent for review and signature by Dr. Anton Kelp, Medical Director for Pulmonary Rehabilitation Program. 30 day review completed. ITP sent to Dr.Jehanzeb Memon, Medical Director of  Pulmonary Rehab. Continue with ITP unless changes are made by physician.  Only completed orientation thus far. 30 day review completed. ITP sent to Dr.Jehanzeb Memon, Medical Director of  Pulmonary Rehab. Continue with ITP unless changes are made by physician.  Only completed orientation thus far still.              Comments: 30 day review

## 2023-04-19 ENCOUNTER — Encounter (HOSPITAL_COMMUNITY)
Admission: RE | Admit: 2023-04-19 | Discharge: 2023-04-19 | Disposition: A | Discharge status: Home / Self Care | Payer: PPO | Source: Ambulatory Visit | Attending: Pulmonary Disease | Admitting: Pulmonary Disease

## 2023-04-19 DIAGNOSIS — J849 Interstitial pulmonary disease, unspecified: Secondary | ICD-10-CM | POA: Insufficient documentation

## 2023-04-24 ENCOUNTER — Encounter (HOSPITAL_COMMUNITY): Payer: PPO

## 2023-04-26 ENCOUNTER — Encounter (HOSPITAL_COMMUNITY): Payer: PPO

## 2023-04-27 DIAGNOSIS — M5441 Lumbago with sciatica, right side: Secondary | ICD-10-CM | POA: Diagnosis not present

## 2023-04-27 DIAGNOSIS — R300 Dysuria: Secondary | ICD-10-CM | POA: Diagnosis not present

## 2023-04-27 DIAGNOSIS — M109 Gout, unspecified: Secondary | ICD-10-CM | POA: Diagnosis not present

## 2023-04-27 DIAGNOSIS — I5033 Acute on chronic diastolic (congestive) heart failure: Secondary | ICD-10-CM | POA: Diagnosis not present

## 2023-04-27 DIAGNOSIS — J449 Chronic obstructive pulmonary disease, unspecified: Secondary | ICD-10-CM | POA: Diagnosis not present

## 2023-04-27 DIAGNOSIS — F419 Anxiety disorder, unspecified: Secondary | ICD-10-CM | POA: Diagnosis not present

## 2023-04-27 DIAGNOSIS — I7143 Infrarenal abdominal aortic aneurysm, without rupture: Secondary | ICD-10-CM | POA: Diagnosis not present

## 2023-04-27 DIAGNOSIS — K649 Unspecified hemorrhoids: Secondary | ICD-10-CM | POA: Diagnosis not present

## 2023-04-27 DIAGNOSIS — N183 Chronic kidney disease, stage 3 unspecified: Secondary | ICD-10-CM | POA: Diagnosis not present

## 2023-04-27 DIAGNOSIS — J309 Allergic rhinitis, unspecified: Secondary | ICD-10-CM | POA: Diagnosis not present

## 2023-04-27 DIAGNOSIS — I739 Peripheral vascular disease, unspecified: Secondary | ICD-10-CM | POA: Diagnosis not present

## 2023-04-27 DIAGNOSIS — H6092 Unspecified otitis externa, left ear: Secondary | ICD-10-CM | POA: Diagnosis not present

## 2023-04-27 DIAGNOSIS — F329 Major depressive disorder, single episode, unspecified: Secondary | ICD-10-CM | POA: Diagnosis not present

## 2023-04-27 DIAGNOSIS — I10 Essential (primary) hypertension: Secondary | ICD-10-CM | POA: Diagnosis not present

## 2023-05-01 ENCOUNTER — Encounter (HOSPITAL_COMMUNITY): Admission: RE | Admit: 2023-05-01 | Payer: PPO | Source: Ambulatory Visit

## 2023-05-03 ENCOUNTER — Encounter (HOSPITAL_COMMUNITY): Payer: PPO

## 2023-05-03 DIAGNOSIS — I4891 Unspecified atrial fibrillation: Secondary | ICD-10-CM | POA: Diagnosis not present

## 2023-05-03 DIAGNOSIS — I482 Chronic atrial fibrillation, unspecified: Secondary | ICD-10-CM | POA: Diagnosis not present

## 2023-05-03 DIAGNOSIS — I4729 Other ventricular tachycardia: Secondary | ICD-10-CM | POA: Diagnosis not present

## 2023-05-04 ENCOUNTER — Telehealth (HOSPITAL_COMMUNITY): Payer: Self-pay | Admitting: *Deleted

## 2023-05-04 NOTE — Telephone Encounter (Signed)
Talked with patient daughter.  Patient has not been able to attend Pulmonary Rehab due to ED visit for cellulitis in legs and Atrial Fib.  Plan to return on February 3rd. Daughter will call back week before coming.

## 2023-05-08 ENCOUNTER — Encounter (HOSPITAL_COMMUNITY): Payer: PPO

## 2023-05-10 ENCOUNTER — Encounter (HOSPITAL_COMMUNITY): Payer: PPO

## 2023-05-15 ENCOUNTER — Ambulatory Visit: Payer: PPO | Admitting: Urology

## 2023-05-15 ENCOUNTER — Encounter (HOSPITAL_COMMUNITY): Payer: PPO

## 2023-05-16 ENCOUNTER — Encounter (HOSPITAL_COMMUNITY): Payer: Self-pay | Admitting: *Deleted

## 2023-05-16 DIAGNOSIS — J849 Interstitial pulmonary disease, unspecified: Secondary | ICD-10-CM

## 2023-05-16 NOTE — Progress Notes (Signed)
Pulmonary Individual Treatment Plan  Patient Details  Name: [name] MRN: [id number] Date of Birth: 1932-05-19 Referring Provider:   Flowsheet Row PULMONARY REHAB OTHER RESP ORIENTATION from 03/20/2023 in [facility] CARDIAC REHABILITATION  Referring Provider Cyril Mourning MD       Initial Encounter Date:  Flowsheet Row PULMONARY REHAB OTHER RESP ORIENTATION from 03/20/2023 in [facility] [place] CARDIAC REHABILITATION  Date 03/20/23       Visit Diagnosis: ILD (interstitial lung disease) (HCC)  Patient's Home Medications on Admission:   Current Outpatient Medications:    acetaminophen (TYLENOL) 500 MG tablet, Take 500 mg by mouth every 6 (six) hours as needed for headache or moderate pain., Disp: , Rfl:    ALPRAZolam (XANAX) 0.5 MG tablet, Take 0.5 mg by mouth daily as needed for anxiety or sleep., Disp: , Rfl:    apixaban (ELIQUIS) 5 MG TABS tablet, Take 1 tablet (5 mg total) by mouth 2 (two) times daily., Disp: 180 tablet, Rfl: 3   ascorbic acid (VITAMIN C) 500 MG tablet, Take 1 tablet (500 mg total) by mouth daily., Disp: 30 tablet, Rfl: 2   cyanocobalamin (VITAMIN B12) 1000 MCG tablet, Take 1 tablet (1,000 mcg total) by mouth daily., Disp: 90 tablet, Rfl: 3   diltiazem (CARDIZEM CD) 180 MG 24 hr capsule, Take 1 capsule (180 mg total) by mouth daily before breakfast., Disp: 90 capsule, Rfl: 3   diltiazem (TIAZAC) 120 MG 24 hr capsule, Take 1 capsule (120 mg total) by mouth at bedtime., Disp: 90 capsule, Rfl: 1   DULoxetine (CYMBALTA) 60 MG capsule, Take 60 mg by mouth daily., Disp: , Rfl:    estradiol (ESTRACE) 0.1 MG/GM vaginal cream, Discard plastic applicator. Insert a blueberry size amount (approximately 1 gram) of cream on fingertip inside vagina at bedtime every night for 1 week then 2 nights per week for long term use., Disp: 30 g, Rfl: 3   fluticasone (CUTIVATE) 0.05 % cream, Apply 1 application topically 2 (two) times daily as needed (skin tears)., Disp: , Rfl:    furosemide  (LASIX) 40 MG tablet, Take 1 tablet (40 mg total) by mouth daily., Disp: 135 tablet, Rfl: 3   hydrocortisone (ANUSOL-HC) 2.5 % rectal cream, Place 1 application rectally 2 (two) times daily., Disp: 30 g, Rfl: 1   isosorbide mononitrate (IMDUR) 30 MG 24 hr tablet, Take 1 tablet (30 mg total) by mouth daily., Disp: 90 tablet, Rfl: 3   magnesium oxide (MAG-OX) 400 (240 Mg) MG tablet, Take 1 tablet by mouth daily., Disp: , Rfl:    metoprolol succinate (TOPROL-XL) 50 MG 24 hr tablet, Take 1 tablet (50 mg total) by mouth 2 (two) times daily. Take with or immediately following a meal., Disp: 180 tablet, Rfl: 3   Multiple Vitamins-Minerals (HAIR/SKIN/NAILS) CAPS, Take 1 tablet by mouth daily., Disp: , Rfl:    Naphazoline HCl (CLEAR EYES OP), Place 1 drop into both eyes daily as needed (itching)., Disp: , Rfl:    nitroGLYCERIN (NITROSTAT) 0.4 MG SL tablet, DISSOLVE ONE TABLET UNDER TONGUE EVERY 5 MINUTES UP TO 3 DOSES AS NEEDED FOR CHEST PAIN., Disp: 25 tablet, Rfl: 6   pantoprazole (PROTONIX) 40 MG tablet, TAKE ONE TABLET BY MOUTH TWICE DAILY BEFORE MEALS, Disp: 60 tablet, Rfl: 5   potassium chloride (KLOR-CON) 10 MEQ tablet, Take 2 tablets (20 mEq total) by mouth 2 (two) times daily., Disp: 120 tablet, Rfl: 6   traMADol (ULTRAM) 50 MG tablet, Take 1 tablet (50 mg total) by mouth every  6 (six) hours as needed., Disp: 90 tablet, Rfl: 3   TRELEGY ELLIPTA 100-62.5-25 MCG/ACT AEPB, Take 1 puff by mouth daily., Disp: , Rfl:    triamcinolone cream (KENALOG) 0.1 %, Apply topically 2 (two) times daily., Disp: , Rfl:    zinc sulfate 220 (50 Zn) MG capsule, Take 1 capsule (220 mg total) by mouth daily., Disp: 30 capsule, Rfl: 3  Past Medical History: Past Medical History:  Diagnosis Date   Anxiety    Arthritis    Atrial fibrillation (HCC)    Bursitis    Left shoulder   Cataract    CHF (congestive heart failure) (HCC)    CKD (chronic kidney disease)    stage 3-4   COPD (chronic obstructive pulmonary disease)  (HCC)    Coronary atherosclerosis of native coronary artery    a. s/p DES to LCx in 04/2013 b. cath in 11/2015 showing patent stent with 20% prox-LAD and 80% ostial RCA stenosis for which medical management was recommended due to small artery size   Depression    Diastolic heart failure (HCC)    EF 55-60%   Dysphagia, unspecified(787.20)    Dyspnea    Dysrhythmia    Essential hypertension    GERD (gastroesophageal reflux disease)    Hx Schatzki's ring, multiple EGD/ED last 01/06/2004   Gout    Headache    History of anemia    Hyperlipidemia    Internal hemorrhoids without mention of complication    MI (myocardial infarction) (HCC) 2006   Microscopic colitis 2003   Panic disorder without agoraphobia    Paresthesia    Pneumonia 12/2011   PVD (peripheral vascular disease) (HCC)    S/P colonoscopy 09/27/2001   internal hemorrhoids, desc colon inflam polyp, SB BX-chronic duodenitis, colitis   Sleep apnea    Thyroid disease     Tobacco Use: Social History   Tobacco Use  Smoking Status Former   Current packs/day: 0.00   Average packs/day: 1 pack/day for 64.0 years (64.0 ttl pk-yrs)   Types: Cigarettes   Start date: 12/24/1947   Quit date: 11/17/2001   Years since quitting: 21.5  Smokeless Tobacco Never  Tobacco Comments   Quit smoking in 2003    Labs: Review Flowsheet  More data exists      Latest Ref Rng & Units 11/09/2019 11/10/2019 06/16/2020 09/29/2020 09/30/2020  Labs for ITP Cardiac and Pulmonary Rehab  Cholestrol 0 - 200 mg/dL - 161  - - -  LDL (calc) 0 - 99 mg/dL - 98  - - -  HDL-C >09 mg/dL - 38  - - -  Trlycerides <150 mg/dL - 82  - 84  -  Hemoglobin A1c 4.8 - 5.6 % 7.0  - 7.0  - 7.1     Capillary Blood Glucose: Lab Results  Component Value Date   GLUCAP 88 11/11/2019   GLUCAP 100 (H) 11/11/2019   GLUCAP 111 (H) 11/10/2019   GLUCAP 136 (H) 11/10/2019   GLUCAP 117 (H) 11/10/2019     Pulmonary Assessment Scores:  Pulmonary Assessment Scores     Row Name  03/20/23 1433         ADL UCSD   ADL Phase Entry     SOB Score total 91     Rest 1     Walk 4     Stairs 5     Bath 3     Dress 3     Shop 5       CAT  Score   CAT Score 34       mMRC Score   mMRC Score 3             UCSD: Self-administered rating of dyspnea associated with activities of daily living (ADLs) 6-point scale (0 = "not at all" to 5 = "maximal or unable to do because of breathlessness")  Scoring Scores range from 0 to 120.  Minimally important difference is 5 units  CAT: CAT can identify the health impairment of COPD patients and is better correlated with disease progression.  CAT has a scoring range of zero to 40. The CAT score is classified into four groups of low (less than 10), medium (10 - 20), high (21-30) and very high (31-40) based on the impact level of disease on health status. A CAT score over 10 suggests significant symptoms.  A worsening CAT score could be explained by an exacerbation, poor medication adherence, poor inhaler technique, or progression of COPD or comorbid conditions.  CAT MCID is 2 points  mMRC: mMRC (Modified Medical Research Council) Dyspnea Scale is used to assess the degree of baseline functional disability in patients of respiratory disease due to dyspnea. No minimal important difference is established. A decrease in score of 1 point or greater is considered a positive change.   Pulmonary Function Assessment:  Pulmonary Function Assessment - 03/20/23 1433       Breath   Shortness of Breath Yes;Fear of Shortness of Breath;Limiting activity;Panic with Shortness of Breath             Exercise Target Goals: Exercise Program Goal: Individual exercise prescription set using results from initial 6 min walk test and THRR while considering  patient's activity barriers and safety.   Exercise Prescription Goal: Initial exercise prescription builds to 30-45 minutes a day of aerobic activity, 2-3 days per week.  Home exercise  guidelines will be given to patient during program as part of exercise prescription that the participant will acknowledge.  Activity Barriers & Risk Stratification:  Activity Barriers & Cardiac Risk Stratification - 03/19/23 1311       Activity Barriers & Cardiac Risk Stratification   Activity Barriers Back Problems;Chest Pain/Angina;Shortness of Breath;Deconditioning;Muscular Weakness;Right Knee Replacement;History of Falls;Assistive Device;Balance Concerns    Cardiac Risk Stratification Moderate             6 Minute Walk:  6 Minute Walk     Row Name 03/20/23 1418         6 Minute Walk   Phase Initial     Distance 440 feet     Walk Time 5.75 minutes     # of Rest Breaks 1  15 sec     MPH 0.87     METS 1.26     RPE 17     Perceived Dyspnea  3     VO2 Peak 4.4     Symptoms Yes (comment)     Comments chronic back pain 9/10, SOB     Resting HR 72 bpm     Resting BP 124/72     Resting Oxygen Saturation  96 %     Exercise Oxygen Saturation  during 6 min walk 80 %     Max Ex. HR 165 bpm     Max Ex. BP 168/84     2 Minute Post BP 162/72       Interval HR   1 Minute HR 165  possible error reading on first two HR but has hx afib  2 Minute HR 159     3 Minute HR 106     4 Minute HR 94     5 Minute HR 95     6 Minute HR 96     2 Minute Post HR 79     Interval Heart Rate? Yes              Oxygen Initial Assessment:  Oxygen Initial Assessment - 03/20/23 1426       Home Oxygen   Home Oxygen Device None    Sleep Oxygen Prescription None    Home Exercise Oxygen Prescription None    Home Resting Oxygen Prescription None      Initial 6 min Walk   Oxygen Used None      Program Oxygen Prescription   Program Oxygen Prescription None      Intervention   Short Term Goals To learn and understand importance of monitoring SPO2 with pulse oximeter and demonstrate accurate use of the pulse oximeter.;To learn and understand importance of maintaining oxygen  saturations>88%;To learn and demonstrate proper pursed lip breathing techniques or other breathing techniques. ;To learn and demonstrate proper use of respiratory medications    Long  Term Goals Maintenance of O2 saturations>88%;Compliance with respiratory medication;Verbalizes importance of monitoring SPO2 with pulse oximeter and return demonstration;Exhibits proper breathing techniques, such as pursed lip breathing or other method taught during program session;Demonstrates proper use of MDI's             Oxygen Re-Evaluation:   Oxygen Discharge (Final Oxygen Re-Evaluation):   Initial Exercise Prescription:  Initial Exercise Prescription - 03/20/23 1400       Date of Initial Exercise RX and Referring Provider   Date 03/20/23    Referring Provider Cyril Mourning MD      Oxygen   Maintain Oxygen Saturation 88% or higher      NuStep   Level 1    SPM 60    Minutes 15    METs 1.5      Arm/Foot Ergometer   Level 1    Watts 10    Minutes 7    METs 1.5      Track   Laps 2    Minutes 7    METs 1.5      Prescription Details   Frequency (times per week) 2    Duration Progress to 30 minutes of continuous aerobic without signs/symptoms of physical distress      Intensity   THRR 40-80% of Max Heartrate 96-120    Ratings of Perceived Exertion 11-13    Perceived Dyspnea 0-4      Progression   Progression Continue to progress workloads to maintain intensity without signs/symptoms of physical distress.      Resistance Training   Training Prescription Yes    Weight 2 lb    Reps 10-15             Perform Capillary Blood Glucose checks as needed.  Exercise Prescription Changes:   Exercise Prescription Changes     Row Name 03/20/23 1400             Response to Exercise   Blood Pressure (Admit) 124/72       Blood Pressure (Exercise) 168/84       Blood Pressure (Exit) 134/74       Heart Rate (Admit) 72 bpm       Heart Rate (Exercise) 165 bpm       Heart Rate  (Exit) 74  bpm       Oxygen Saturation (Admit) 96 %       Oxygen Saturation (Exercise) 80 %       Oxygen Saturation (Exit) 94 %       Rating of Perceived Exertion (Exercise) 17       Perceived Dyspnea (Exercise) 3       Symptoms back pain 9/10, SOB       Comments walk test results                Exercise Comments:   Exercise Goals and Review:   Exercise Goals     Row Name 03/20/23 1423             Exercise Goals   Increase Physical Activity Yes       Intervention Provide advice, education, support and counseling about physical activity/exercise needs.;Develop an individualized exercise prescription for aerobic and resistive training based on initial evaluation findings, risk stratification, comorbidities and participant's personal goals.       Expected Outcomes Short Term: Attend rehab on a regular basis to increase amount of physical activity.;Long Term: Add in home exercise to make exercise part of routine and to increase amount of physical activity.;Long Term: Exercising regularly at least 3-5 days a week.       Increase Strength and Stamina Yes       Intervention Provide advice, education, support and counseling about physical activity/exercise needs.;Develop an individualized exercise prescription for aerobic and resistive training based on initial evaluation findings, risk stratification, comorbidities and participant's personal goals.       Expected Outcomes Short Term: Increase workloads from initial exercise prescription for resistance, speed, and METs.;Short Term: Perform resistance training exercises routinely during rehab and add in resistance training at home;Long Term: Improve cardiorespiratory fitness, muscular endurance and strength as measured by increased METs and functional capacity ( )       Able to understand and use rate of perceived exertion (RPE) scale Yes       Intervention Provide education and explanation on how to use RPE scale       Expected  Outcomes Short Term: Able to use RPE daily in rehab to express subjective intensity level;Long Term:  Able to use RPE to guide intensity level when exercising independently       Able to understand and use Dyspnea scale Yes       Intervention Provide education and explanation on how to use Dyspnea scale       Expected Outcomes Short Term: Able to use Dyspnea scale daily in rehab to express subjective sense of shortness of breath during exertion;Long Term: Able to use Dyspnea scale to guide intensity level when exercising independently       Knowledge and understanding of Target Heart Rate Range (THRR) Yes       Intervention Provide education and explanation of THRR including how the numbers were predicted and where they are located for reference       Expected Outcomes Short Term: Able to state/look up THRR;Long Term: Able to use THRR to govern intensity when exercising independently;Short Term: Able to use daily as guideline for intensity in rehab       Able to check pulse independently Yes       Intervention Review the importance of being able to check your own pulse for safety during independent exercise;Provide education and demonstration on how to check pulse in carotid and radial arteries.       Expected Outcomes Short  Term: Able to explain why pulse checking is important during independent exercise;Long Term: Able to check pulse independently and accurately       Understanding of Exercise Prescription Yes       Intervention Provide education, explanation, and written materials on patient's individual exercise prescription       Expected Outcomes Short Term: Able to explain program exercise prescription;Long Term: Able to explain home exercise prescription to exercise independently                Exercise Goals Re-Evaluation :  Exercise Goals Re-Evaluation     Row Name 04/23/23 0825             Exercise Goal Re-Evaluation   Comments [place] has not started the program. Will be  calling to see why she has not attened appointments                Discharge Exercise Prescription (Final Exercise Prescription Changes):  Exercise Prescription Changes - 03/20/23 1400       Response to Exercise   Blood Pressure (Admit) 124/72    Blood Pressure (Exercise) 168/84    Blood Pressure (Exit) 134/74    Heart Rate (Admit) 72 bpm    Heart Rate (Exercise) 165 bpm    Heart Rate (Exit) 74 bpm    Oxygen Saturation (Admit) 96 %    Oxygen Saturation (Exercise) 80 %    Oxygen Saturation (Exit) 94 %    Rating of Perceived Exertion (Exercise) 17    Perceived Dyspnea (Exercise) 3    Symptoms back pain 9/10, SOB    Comments walk test results             Nutrition:  Target Goals: Understanding of nutrition guidelines, daily intake of sodium 1500mg , cholesterol 200mg , calories 30% from fat and 7% or less from saturated fats, daily to have 5 or more servings of fruits and vegetables.  Biometrics:  Pre Biometrics - 03/20/23 1424       Pre Biometrics   Height 5\' 1"  (1.549 m)    Weight 159 lb 8 oz (72.3 kg)    Waist Circumference 38 inches    Hip Circumference 43 inches    Waist to Hip Ratio 0.88 %    BMI (Calculated) 30.15    Grip Strength 13.9 kg    Single Leg Stand 2.7 seconds              Nutrition Therapy Plan and Nutrition Goals:  Nutrition Therapy & Goals - 03/20/23 1425       Intervention Plan   Intervention Prescribe, educate and counsel regarding individualized specific dietary modifications aiming towards targeted core components such as weight, hypertension, lipid management, diabetes, heart failure and other comorbidities.;Nutrition handout(s) given to patient.    Expected Outcomes Short Term Goal: Understand basic principles of dietary content, such as calories, fat, sodium, cholesterol and nutrients.;Long Term Goal: Adherence to prescribed nutrition plan.             Nutrition Assessments:  MEDIFICTS Score Key: >=70 Need to make  dietary changes  40-70 Heart Healthy Diet <= 40 Therapeutic Level Cholesterol Diet  Flowsheet Row PULMONARY REHAB OTHER RESP ORIENTATION from 03/20/2023 in [facility] CARDIAC REHABILITATION  Picture Your Plate Total Score on Admission 47      Picture Your Plate Scores: <16 Unhealthy dietary pattern with much room for improvement. 41-50 Dietary pattern unlikely to meet recommendations for good health and room for improvement. 51-60 More healthful dietary pattern, with  some room for improvement.  >60 Healthy dietary pattern, although there may be some specific behaviors that could be improved.    Nutrition Goals Re-Evaluation:   Nutrition Goals Discharge (Final Nutrition Goals Re-Evaluation):   Psychosocial: Target Goals: Acknowledge presence or absence of significant depression and/or stress, maximize coping skills, provide positive support system. Participant is able to verbalize types and ability to use techniques and skills needed for reducing stress and depression.  Initial Review & Psychosocial Screening:  Initial Psych Review & Screening - 03/19/23 1342       Initial Review   Current issues with History of Depression;Current Psychotropic Meds;Current Sleep Concerns;Current Stress Concerns    Source of Stress Concerns Chronic Illness;Family      Family Dynamics   Good Support System? Yes      Barriers   Psychosocial barriers to participate in program The patient should benefit from training in stress management and relaxation.      Screening Interventions   Interventions To provide support and resources with identified psychosocial needs;Encouraged to exercise;Provide feedback about the scores to participant    Expected Outcomes Short Term goal: Utilizing psychosocial counselor, staff and physician to assist with identification of specific Stressors or current issues interfering with healing process. Setting desired goal for each stressor or current issue identified.;Long  Term Goal: Stressors or current issues are controlled or eliminated.;Short Term goal: Identification and review with participant of any Quality of Life or Depression concerns found by scoring the questionnaire.;Long Term goal: The participant improves quality of Life and PHQ9 Scores as seen by post scores and/or verbalization of changes             Quality of Life Scores:  Scores of 19 and below usually indicate a poorer quality of life in these areas.  A difference of  2-3 points is a clinically meaningful difference.  A difference of 2-3 points in the total score of the Quality of Life Index has been associated with significant improvement in overall quality of life, self-image, physical symptoms, and general health in studies assessing change in quality of life.   PHQ-9: Review Flowsheet       03/20/2023 03/24/2016  Depression screen PHQ 2/9  Decreased Interest 1 0  Down, Depressed, Hopeless 3 1  PHQ - 2 Score 4 1  Altered sleeping 3 -  Tired, decreased energy 3 -  Change in appetite 2 -  Feeling bad or failure about yourself  2 -  Trouble concentrating 0 -  Moving slowly or fidgety/restless 1 -  Suicidal thoughts 0 -  PHQ-9 Score 15 -  Difficult doing work/chores Extremely dIfficult -   Interpretation of Total Score  Total Score Depression Severity:  1-4 = Minimal depression, 5-9 = Mild depression, 10-14 = Moderate depression, 15-19 = Moderately severe depression, 20-27 = Severe depression   Psychosocial Evaluation and Intervention:  Psychosocial Evaluation - 03/19/23 1343       Psychosocial Evaluation & Interventions   Interventions Stress management education;Relaxation education;Encouraged to exercise with the program and follow exercise prescription    Comments Patient was referred to [facility] with ILD by Dr. Vassie Loll. She and her daughter both answered questions. Patient's son and grandson live with her but her 2 daughter's are her main support. She has a history of panic  attacks and depression both treated with cymbalta. She says she does get depressed at times due to having to stay home alot with her chronic health issues. She had back surgery in 2017 and  due to osteoporosis some of the pins and screws are getitng lose and this is causing chronic back pain. She also reports bursitis in her left shoulder. She says she is not sure if she will be able to exercise but would like to try and see what she is able to do. She has trouble both going and staying asleep. She takes Alprazolam prn for sleep which helps some. Her major stressors are her son that lives with her has liver cancer diagnosed last year and 3 years ago, her son passed away 2024/04/18 and she says she has a difficult time with increased depression during the month of December. Her goals for the program are to get stronger, increase her endurance, be more independent and improve her QOL. She is motivated to try the program but is aware that her chronic pain may be a barrier for her to complete the program.    Expected Outcomes Short Term: start the program and attend consistently. Long term: meet her personal goals.    Continue Psychosocial Services  Follow up required by staff             Psychosocial Re-Evaluation:   Psychosocial Discharge (Final Psychosocial Re-Evaluation):    Education: Education Goals: Education classes will be provided on a weekly basis, covering required topics. Participant will state understanding/return demonstration of topics presented.  Learning Barriers/Preferences:  Learning Barriers/Preferences - 03/19/23 1339       Learning Barriers/Preferences   Learning Barriers None    Learning Preferences Audio;Written Material;Skilled Demonstration             Education Topics: How Lungs Work and Diseases: - Discuss the anatomy of the lungs and diseases that can affect the lungs, such as COPD.   Exercise: -Discuss the importance of exercise, FITT principles of  exercise, normal and abnormal responses to exercise, and how to exercise safely.   Environmental Irritants: -Discuss types of environmental irritants and how to limit exposure to environmental irritants.   Meds/Inhalers and oxygen: - Discuss respiratory medications, definition of an inhaler and oxygen, and the proper way to use an inhaler and oxygen.   Energy Saving Techniques: - Discuss methods to conserve energy and decrease shortness of breath when performing activities of daily living.    Bronchial Hygiene / Breathing Techniques: - Discuss breathing mechanics, pursed-lip breathing technique,  proper posture, effective ways to clear airways, and other functional breathing techniques   Cleaning Equipment: - Provides group verbal and written instruction about the health risks of elevated stress, cause of high stress, and healthy ways to reduce stress.   Nutrition I: Fats: - Discuss the types of cholesterol, what cholesterol does to the body, and how cholesterol levels can be controlled.   Nutrition II: Labels: -Discuss the different components of food labels and how to read food labels.   Respiratory Infections: - Discuss the signs and symptoms of respiratory infections, ways to prevent respiratory infections, and the importance of seeking medical treatment when having a respiratory infection.   Stress I: Signs and Symptoms: - Discuss the causes of stress, how stress may lead to anxiety and depression, and ways to limit stress.   Stress II: Relaxation: -Discuss relaxation techniques to limit stress.   Oxygen for Home/Travel: - Discuss how to prepare for travel when on oxygen and proper ways to transport and store oxygen to ensure safety.   Knowledge Questionnaire Score:  Knowledge Questionnaire Score - 03/20/23 1425       Knowledge Questionnaire Score  Pre Score 16/18   with daughter            Core Components/Risk Factors/Patient Goals at Admission:   Personal Goals and Risk Factors at Admission - 03/20/23 1425       Core Components/Risk Factors/Patient Goals on Admission    Weight Management Yes;Weight Loss;Weight Maintenance    Intervention Weight Management: Develop a combined nutrition and exercise program designed to reach desired caloric intake, while maintaining appropriate intake of nutrient and fiber, sodium and fats, and appropriate energy expenditure required for the weight goal.;Weight Management: Provide education and appropriate resources to help participant work on and attain dietary goals.;Weight Management/Obesity: Establish reasonable short term and long term weight goals.;Obesity: Provide education and appropriate resources to help participant work on and attain dietary goals.    Admit Weight 159 lb 8 oz (72.3 kg)    Goal Weight: Short Term 155 lb (70.3 kg)    Goal Weight: Long Term 155 lb (70.3 kg)    Expected Outcomes Short Term: Continue to assess and modify interventions until short term weight is achieved;Long Term: Adherence to nutrition and physical activity/exercise program aimed toward attainment of established weight goal;Weight Maintenance: Understanding of the daily nutrition guidelines, which includes 25-35% calories from fat, 7% or less cal from saturated fats, less than 200mg  cholesterol, less than 1.5gm of sodium, & 5 or more servings of fruits and vegetables daily;Weight Loss: Understanding of general recommendations for a balanced deficit meal plan, which promotes 1-2 lb weight loss per week and includes a negative energy balance of [phone] kcal/d;Understanding recommendations for meals to include 15-35% energy as protein, 25-35% energy from fat, 35-60% energy from carbohydrates, less than 200mg  of dietary cholesterol, 20-35 gm of total fiber daily;Understanding of distribution of calorie intake throughout the day with the consumption of 4-5 meals/snacks    Improve shortness of breath with ADL's Yes    Intervention  Provide education, individualized exercise plan and daily activity instruction to help decrease symptoms of SOB with activities of daily living.    Expected Outcomes Short Term: Improve cardiorespiratory fitness to achieve a reduction of symptoms when performing ADLs;Long Term: Be able to perform more ADLs without symptoms or delay the onset of symptoms    Increase knowledge of respiratory medications and ability to use respiratory devices properly  Yes    Intervention Provide education and demonstration as needed of appropriate use of medications, inhalers, and oxygen therapy.    Expected Outcomes Short Term: Achieves understanding of medications use. Understands that oxygen is a medication prescribed by physician. Demonstrates appropriate use of inhaler and oxygen therapy.;Long Term: Maintain appropriate use of medications, inhalers, and oxygen therapy.    Heart Failure Yes    Intervention Provide a combined exercise and nutrition program that is supplemented with education, support and counseling about heart failure. Directed toward relieving symptoms such as shortness of breath, decreased exercise tolerance, and extremity edema.    Expected Outcomes Improve functional capacity of life;Short term: Attendance in program 2-3 days a week with increased exercise capacity. Reported lower sodium intake. Reported increased fruit and vegetable intake. Reports medication compliance.;Short term: Daily weights obtained and reported for increase. Utilizing diuretic protocols set by physician.;Long term: Adoption of self-care skills and reduction of barriers for early signs and symptoms recognition and intervention leading to self-care maintenance.    Hypertension Yes    Intervention Provide education on lifestyle modifcations including regular physical activity/exercise, weight management, moderate sodium restriction and increased consumption of fresh fruit, vegetables, and low  fat dairy, alcohol moderation, and  smoking cessation.;Monitor prescription use compliance.    Expected Outcomes Short Term: Continued assessment and intervention until BP is < 140/55mm HG in hypertensive participants. < 130/60mm HG in hypertensive participants with diabetes, heart failure or chronic kidney disease.;Long Term: Maintenance of blood pressure at goal levels.    Lipids Yes    Intervention Provide education and support for participant on nutrition & aerobic/resistive exercise along with prescribed medications to achieve LDL 70mg , HDL >40mg .    Expected Outcomes Short Term: Participant states understanding of desired cholesterol values and is compliant with medications prescribed. Participant is following exercise prescription and nutrition guidelines.;Long Term: Cholesterol controlled with medications as prescribed, with individualized exercise RX and with personalized nutrition plan. Value goals: LDL < 70mg , HDL > 40 mg.             Core Components/Risk Factors/Patient Goals Review:    Core Components/Risk Factors/Patient Goals at Discharge (Final Review):    ITP Comments:  ITP Comments     Row Name 03/20/23 1414 03/21/23 1310 04/17/23 1442 05/16/23 1144     ITP Comments Patient attend orientation today.  Patient is attending Pulmonary Rehabilitation Program.  Documentation for diagnosis can be found in [facility] 03/06/23 with Dr. Vassie Loll.  Reviewed medical chart, RPE/RPD, gym safety, and program guidelines.  Patient was fitted to equipment they will be using during rehab.  Patient is scheduled to start exercise on Tuesday 03/27/23 at 1500.   Initial ITP created and sent for review and signature by Dr. Erick Blinks, Medical Director for Pulmonary Rehabilitation Program. 30 day review completed. ITP sent to Dr.Jehanzeb Memon, Medical Director of  Pulmonary Rehab. Continue with ITP unless changes are made by physician.  Only completed orientation thus far. 30 day review completed. ITP sent to Dr.Jehanzeb Memon, Medical  Director of  Pulmonary Rehab. Continue with ITP unless changes are made by physician.  Only completed orientation thus far still. 30 day review completed. ITP sent to Dr.Jehanzeb Memon, Medical Director of  Pulmonary Rehab. Continue with ITP unless changes are made by physician.  Only completed orientation thus far still in December. We will send letter as she has not returned our calls.             Comments: 30 day review

## 2023-05-17 ENCOUNTER — Encounter (HOSPITAL_COMMUNITY): Payer: PPO

## 2023-05-22 ENCOUNTER — Encounter (HOSPITAL_COMMUNITY): Payer: PPO

## 2023-05-24 ENCOUNTER — Encounter (HOSPITAL_COMMUNITY): Payer: PPO | Attending: Pulmonary Disease

## 2023-05-24 DIAGNOSIS — J849 Interstitial pulmonary disease, unspecified: Secondary | ICD-10-CM | POA: Insufficient documentation

## 2023-05-29 ENCOUNTER — Encounter (HOSPITAL_COMMUNITY): Payer: PPO

## 2023-05-31 ENCOUNTER — Encounter (HOSPITAL_COMMUNITY): Payer: PPO

## 2023-06-05 ENCOUNTER — Encounter (HOSPITAL_COMMUNITY): Payer: PPO

## 2023-06-07 ENCOUNTER — Encounter (HOSPITAL_COMMUNITY): Payer: PPO

## 2023-06-07 DIAGNOSIS — N1832 Chronic kidney disease, stage 3b: Secondary | ICD-10-CM | POA: Diagnosis not present

## 2023-06-07 DIAGNOSIS — I2510 Atherosclerotic heart disease of native coronary artery without angina pectoris: Secondary | ICD-10-CM | POA: Diagnosis not present

## 2023-06-07 DIAGNOSIS — E1122 Type 2 diabetes mellitus with diabetic chronic kidney disease: Secondary | ICD-10-CM | POA: Diagnosis not present

## 2023-06-07 DIAGNOSIS — I7143 Infrarenal abdominal aortic aneurysm, without rupture: Secondary | ICD-10-CM | POA: Diagnosis not present

## 2023-06-08 ENCOUNTER — Ambulatory Visit: Payer: PPO | Admitting: Nurse Practitioner

## 2023-06-12 ENCOUNTER — Encounter (HOSPITAL_COMMUNITY): Payer: PPO

## 2023-06-13 ENCOUNTER — Encounter (HOSPITAL_COMMUNITY): Payer: Self-pay | Admitting: *Deleted

## 2023-06-13 DIAGNOSIS — J849 Interstitial pulmonary disease, unspecified: Secondary | ICD-10-CM

## 2023-06-13 NOTE — Progress Notes (Signed)
 Pulmonary Individual Treatment Plan  Patient Details  Name: [name] MRN: [id number] Date of Birth: 1932-09-22 Referring Provider:   Flowsheet Row PULMONARY REHAB OTHER RESP ORIENTATION from 03/20/2023 in [facility] CARDIAC REHABILITATION  Referring Provider Cyril Mourning MD       Initial Encounter Date:  Flowsheet Row PULMONARY REHAB OTHER RESP ORIENTATION from 03/20/2023 in [place] CARDIAC REHABILITATION  Date 03/20/23       Visit Diagnosis: ILD (interstitial lung disease) (HCC)  Patient's Home Medications on Admission:   Current Outpatient Medications:    acetaminophen (TYLENOL) 500 MG tablet, Take 500 mg by mouth every 6 (six) hours as needed for headache or moderate pain., Disp: , Rfl:    ALPRAZolam (XANAX) 0.5 MG tablet, Take 0.5 mg by mouth daily as needed for anxiety or sleep., Disp: , Rfl:    apixaban (ELIQUIS) 5 MG TABS tablet, Take 1 tablet (5 mg total) by mouth 2 (two) times daily., Disp: 180 tablet, Rfl: 3   ascorbic acid (VITAMIN C) 500 MG tablet, Take 1 tablet (500 mg total) by mouth daily., Disp: 30 tablet, Rfl: 2   cyanocobalamin (VITAMIN B12) 1000 MCG tablet, Take 1 tablet (1,000 mcg total) by mouth daily., Disp: 90 tablet, Rfl: 3   diltiazem (CARDIZEM CD) 180 MG 24 hr capsule, Take 1 capsule (180 mg total) by mouth daily before breakfast., Disp: 90 capsule, Rfl: 3   diltiazem (TIAZAC) 120 MG 24 hr capsule, Take 1 capsule (120 mg total) by mouth at bedtime., Disp: 90 capsule, Rfl: 1   DULoxetine (CYMBALTA) 60 MG capsule, Take 60 mg by mouth daily., Disp: , Rfl:    estradiol (ESTRACE) 0.1 MG/GM vaginal cream, Discard plastic applicator. Insert a blueberry size amount (approximately 1 gram) of cream on fingertip inside vagina at bedtime every night for 1 week then 2 nights per week for long term use., Disp: 30 g, Rfl: 3   fluticasone (CUTIVATE) 0.05 % cream, Apply 1 application topically 2 (two) times daily as needed (skin tears)., Disp: , Rfl:    furosemide  (LASIX) 40 MG tablet, Take 1 tablet (40 mg total) by mouth daily., Disp: 135 tablet, Rfl: 3   hydrocortisone (ANUSOL-HC) 2.5 % rectal cream, Place 1 application rectally 2 (two) times daily., Disp: 30 g, Rfl: 1   isosorbide mononitrate (IMDUR) 30 MG 24 hr tablet, Take 1 tablet (30 mg total) by mouth daily., Disp: 90 tablet, Rfl: 3   magnesium oxide (MAG-OX) 400 (240 Mg) MG tablet, Take 1 tablet by mouth daily., Disp: , Rfl:    metoprolol succinate (TOPROL-XL) 50 MG 24 hr tablet, Take 1 tablet (50 mg total) by mouth 2 (two) times daily. Take with or immediately following a meal., Disp: 180 tablet, Rfl: 3   Multiple Vitamins-Minerals (HAIR/SKIN/NAILS) CAPS, Take 1 tablet by mouth daily., Disp: , Rfl:    Naphazoline HCl (CLEAR EYES OP), Place 1 drop into both eyes daily as needed (itching)., Disp: , Rfl:    nitroGLYCERIN (NITROSTAT) 0.4 MG SL tablet, DISSOLVE ONE TABLET UNDER TONGUE EVERY 5 MINUTES UP TO 3 DOSES AS NEEDED FOR CHEST PAIN., Disp: 25 tablet, Rfl: 6   pantoprazole (PROTONIX) 40 MG tablet, TAKE ONE TABLET BY MOUTH TWICE DAILY BEFORE MEALS, Disp: 60 tablet, Rfl: 5   potassium chloride (KLOR-CON) 10 MEQ tablet, Take 2 tablets (20 mEq total) by mouth 2 (two) times daily., Disp: 120 tablet, Rfl: 6   traMADol (ULTRAM) 50 MG tablet, Take 1 tablet (50 mg total) by mouth every  6 (six) hours as needed., Disp: 90 tablet, Rfl: 3   TRELEGY ELLIPTA 100-62.5-25 MCG/ACT AEPB, Take 1 puff by mouth daily., Disp: , Rfl:    triamcinolone cream (KENALOG) 0.1 %, Apply topically 2 (two) times daily., Disp: , Rfl:    zinc sulfate 220 (50 Zn) MG capsule, Take 1 capsule (220 mg total) by mouth daily., Disp: 30 capsule, Rfl: 3  Past Medical History: Past Medical History:  Diagnosis Date   Anxiety    Arthritis    Atrial fibrillation (HCC)    Bursitis    Left shoulder   Cataract    CHF (congestive heart failure) (HCC)    CKD (chronic kidney disease)    stage 3-4   COPD (chronic obstructive pulmonary disease)  (HCC)    Coronary atherosclerosis of native coronary artery    a. s/p DES to LCx in 04/2013 b. cath in 11/2015 showing patent stent with 20% prox-LAD and 80% ostial RCA stenosis for which medical management was recommended due to small artery size   Depression    Diastolic heart failure (HCC)    EF 55-60%   Dysphagia, unspecified(787.20)    Dyspnea    Dysrhythmia    Essential hypertension    GERD (gastroesophageal reflux disease)    Hx Schatzki's ring, multiple EGD/ED last 01/06/2004   Gout    Headache    History of anemia    Hyperlipidemia    Internal hemorrhoids without mention of complication    MI (myocardial infarction) (HCC) 2006   Microscopic colitis 2003   Panic disorder without agoraphobia    Paresthesia    Pneumonia 12/2011   PVD (peripheral vascular disease) (HCC)    S/P colonoscopy 09/27/2001   internal hemorrhoids, desc colon inflam polyp, SB BX-chronic duodenitis, colitis   Sleep apnea    Thyroid disease     Tobacco Use: Social History   Tobacco Use  Smoking Status Former   Current packs/day: 0.00   Average packs/day: 1 pack/day for 64.0 years (64.0 ttl pk-yrs)   Types: Cigarettes   Start date: 12/24/1947   Quit date: 11/17/2001   Years since quitting: 21.5  Smokeless Tobacco Never  Tobacco Comments   Quit smoking in 2003    Labs: Review Flowsheet  More data exists      Latest Ref Rng & Units 11/09/2019 11/10/2019 06/16/2020 09/29/2020 09/30/2020  Labs for ITP Cardiac and Pulmonary Rehab  Cholestrol 0 - 200 mg/dL - 161  - - -  LDL (calc) 0 - 99 mg/dL - 98  - - -  HDL-C >09 mg/dL - 38  - - -  Trlycerides <150 mg/dL - 82  - 84  -  Hemoglobin A1c 4.8 - 5.6 % 7.0  - 7.0  - 7.1     Capillary Blood Glucose: Lab Results  Component Value Date   GLUCAP 88 11/11/2019   GLUCAP 100 (H) 11/11/2019   GLUCAP 111 (H) 11/10/2019   GLUCAP 136 (H) 11/10/2019   GLUCAP 117 (H) 11/10/2019     Pulmonary Assessment Scores:  Pulmonary Assessment Scores     Row Name  03/20/23 1433         ADL UCSD   ADL Phase Entry     SOB Score total 91     Rest 1     Walk 4     Stairs 5     Bath 3     Dress 3     Shop 5       CAT  Score   CAT Score 34       mMRC Score   mMRC Score 3             UCSD: Self-administered rating of dyspnea associated with activities of daily living (ADLs) 6-point scale (0 = "not at all" to 5 = "maximal or unable to do because of breathlessness")  Scoring Scores range from 0 to 120.  Minimally important difference is 5 units  CAT: CAT can identify the health impairment of COPD patients and is better correlated with disease progression.  CAT has a scoring range of zero to 40. The CAT score is classified into four groups of low (less than 10), medium (10 - 20), high (21-30) and very high (31-40) based on the impact level of disease on health status. A CAT score over 10 suggests significant symptoms.  A worsening CAT score could be explained by an exacerbation, poor medication adherence, poor inhaler technique, or progression of COPD or comorbid conditions.  CAT MCID is 2 points  mMRC: mMRC (Modified Medical Research Council) Dyspnea Scale is used to assess the degree of baseline functional disability in patients of respiratory disease due to dyspnea. No minimal important difference is established. A decrease in score of 1 point or greater is considered a positive change.   Pulmonary Function Assessment:  Pulmonary Function Assessment - 03/20/23 1433       Breath   Shortness of Breath Yes;Fear of Shortness of Breath;Limiting activity;Panic with Shortness of Breath             Exercise Target Goals: Exercise Program Goal: Individual exercise prescription set using results from initial 6 min walk test and THRR while considering  patient's activity barriers and safety.   Exercise Prescription Goal: Initial exercise prescription builds to 30-45 minutes a day of aerobic activity, 2-3 days per week.  Home exercise  guidelines will be given to patient during program as part of exercise prescription that the participant will acknowledge.  Activity Barriers & Risk Stratification:  Activity Barriers & Cardiac Risk Stratification - 03/19/23 1311       Activity Barriers & Cardiac Risk Stratification   Activity Barriers Back Problems;Chest Pain/Angina;Shortness of Breath;Deconditioning;Muscular Weakness;Right Knee Replacement;History of Falls;Assistive Device;Balance Concerns    Cardiac Risk Stratification Moderate             6 Minute Walk:  6 Minute Walk     Row Name 03/20/23 1418         6 Minute Walk   Phase Initial     Distance 440 feet     Walk Time 5.75 minutes     # of Rest Breaks 1  15 sec     MPH 0.87     METS 1.26     RPE 17     Perceived Dyspnea  3     VO2 Peak 4.4     Symptoms Yes (comment)     Comments chronic back pain 9/10, SOB     Resting HR 72 bpm     Resting BP 124/72     Resting Oxygen Saturation  96 %     Exercise Oxygen Saturation  during 6 min walk 80 %     Max Ex. HR 165 bpm     Max Ex. BP 168/84     2 Minute Post BP 162/72       Interval HR   1 Minute HR 165  possible error reading on first two HR but has hx afib  2 Minute HR 159     3 Minute HR 106     4 Minute HR 94     5 Minute HR 95     6 Minute HR 96     2 Minute Post HR 79     Interval Heart Rate? Yes              Oxygen Initial Assessment:  Oxygen Initial Assessment - 03/20/23 1426       Home Oxygen   Home Oxygen Device None    Sleep Oxygen Prescription None    Home Exercise Oxygen Prescription None    Home Resting Oxygen Prescription None      Initial 6 min Walk   Oxygen Used None      Program Oxygen Prescription   Program Oxygen Prescription None      Intervention   Short Term Goals To learn and understand importance of monitoring SPO2 with pulse oximeter and demonstrate accurate use of the pulse oximeter.;To learn and understand importance of maintaining oxygen  saturations>88%;To learn and demonstrate proper pursed lip breathing techniques or other breathing techniques. ;To learn and demonstrate proper use of respiratory medications    Long  Term Goals Maintenance of O2 saturations>88%;Compliance with respiratory medication;Verbalizes importance of monitoring SPO2 with pulse oximeter and return demonstration;Exhibits proper breathing techniques, such as pursed lip breathing or other method taught during program session;Demonstrates proper use of MDI's             Oxygen Re-Evaluation:   Oxygen Discharge (Final Oxygen Re-Evaluation):   Initial Exercise Prescription:  Initial Exercise Prescription - 03/20/23 1400       Date of Initial Exercise RX and Referring Provider   Date 03/20/23    Referring Provider Cyril Mourning MD      Oxygen   Maintain Oxygen Saturation 88% or higher      NuStep   Level 1    SPM 60    Minutes 15    METs 1.5      Arm/Foot Ergometer   Level 1    Watts 10    Minutes 7    METs 1.5      Track   Laps 2    Minutes 7    METs 1.5      Prescription Details   Frequency (times per week) 2    Duration Progress to 30 minutes of continuous aerobic without signs/symptoms of physical distress      Intensity   THRR 40-80% of Max Heartrate 96-120    Ratings of Perceived Exertion 11-13    Perceived Dyspnea 0-4      Progression   Progression Continue to progress workloads to maintain intensity without signs/symptoms of physical distress.      Resistance Training   Training Prescription Yes    Weight 2 lb    Reps 10-15             Perform Capillary Blood Glucose checks as needed.  Exercise Prescription Changes:   Exercise Prescription Changes     Row Name 03/20/23 1400             Response to Exercise   Blood Pressure (Admit) 124/72       Blood Pressure (Exercise) 168/84       Blood Pressure (Exit) 134/74       Heart Rate (Admit) 72 bpm       Heart Rate (Exercise) 165 bpm       Heart Rate  (Exit) 74  bpm       Oxygen Saturation (Admit) 96 %       Oxygen Saturation (Exercise) 80 %       Oxygen Saturation (Exit) 94 %       Rating of Perceived Exertion (Exercise) 17       Perceived Dyspnea (Exercise) 3       Symptoms back pain 9/10, SOB       Comments walk test results                Exercise Comments:   Exercise Goals and Review:   Exercise Goals     Row Name 03/20/23 1423             Exercise Goals   Increase Physical Activity Yes       Intervention Provide advice, education, support and counseling about physical activity/exercise needs.;Develop an individualized exercise prescription for aerobic and resistive training based on initial evaluation findings, risk stratification, comorbidities and participant's personal goals.       Expected Outcomes Short Term: Attend rehab on a regular basis to increase amount of physical activity.;Long Term: Add in home exercise to make exercise part of routine and to increase amount of physical activity.;Long Term: Exercising regularly at least 3-5 days a week.       Increase Strength and Stamina Yes       Intervention Provide advice, education, support and counseling about physical activity/exercise needs.;Develop an individualized exercise prescription for aerobic and resistive training based on initial evaluation findings, risk stratification, comorbidities and participant's personal goals.       Expected Outcomes Short Term: Increase workloads from initial exercise prescription for resistance, speed, and METs.;Short Term: Perform resistance training exercises routinely during rehab and add in resistance training at home;Long Term: Improve cardiorespiratory fitness, muscular endurance and strength as measured by increased METs and functional capacity ( )       Able to understand and use rate of perceived exertion (RPE) scale Yes       Intervention Provide education and explanation on how to use RPE scale       Expected  Outcomes Short Term: Able to use RPE daily in rehab to express subjective intensity level;Long Term:  Able to use RPE to guide intensity level when exercising independently       Able to understand and use Dyspnea scale Yes       Intervention Provide education and explanation on how to use Dyspnea scale       Expected Outcomes Short Term: Able to use Dyspnea scale daily in rehab to express subjective sense of shortness of breath during exertion;Long Term: Able to use Dyspnea scale to guide intensity level when exercising independently       Knowledge and understanding of Target Heart Rate Range (THRR) Yes       Intervention Provide education and explanation of THRR including how the numbers were predicted and where they are located for reference       Expected Outcomes Short Term: Able to state/look up THRR;Long Term: Able to use THRR to govern intensity when exercising independently;Short Term: Able to use daily as guideline for intensity in rehab       Able to check pulse independently Yes       Intervention Review the importance of being able to check your own pulse for safety during independent exercise;Provide education and demonstration on how to check pulse in carotid and radial arteries.       Expected Outcomes Short  Term: Able to explain why pulse checking is important during independent exercise;Long Term: Able to check pulse independently and accurately       Understanding of Exercise Prescription Yes       Intervention Provide education, explanation, and written materials on patient's individual exercise prescription       Expected Outcomes Short Term: Able to explain program exercise prescription;Long Term: Able to explain home exercise prescription to exercise independently                Exercise Goals Re-Evaluation :  Exercise Goals Re-Evaluation     Row Name 04/23/23 0825             Exercise Goal Re-Evaluation   Comments [name] has not started the program. Will be  calling to see why she has not attened appointments                Discharge Exercise Prescription (Final Exercise Prescription Changes):  Exercise Prescription Changes - 03/20/23 1400       Response to Exercise   Blood Pressure (Admit) 124/72    Blood Pressure (Exercise) 168/84    Blood Pressure (Exit) 134/74    Heart Rate (Admit) 72 bpm    Heart Rate (Exercise) 165 bpm    Heart Rate (Exit) 74 bpm    Oxygen Saturation (Admit) 96 %    Oxygen Saturation (Exercise) 80 %    Oxygen Saturation (Exit) 94 %    Rating of Perceived Exertion (Exercise) 17    Perceived Dyspnea (Exercise) 3    Symptoms back pain 9/10, SOB    Comments walk test results             Nutrition:  Target Goals: Understanding of nutrition guidelines, daily intake of sodium 1500mg , cholesterol 200mg , calories 30% from fat and 7% or less from saturated fats, daily to have 5 or more servings of fruits and vegetables.  Biometrics:  Pre Biometrics - 03/20/23 1424       Pre Biometrics   Height 5\' 1"  (1.549 m)    Weight 159 lb 8 oz (72.3 kg)    Waist Circumference 38 inches    Hip Circumference 43 inches    Waist to Hip Ratio 0.88 %    BMI (Calculated) 30.15    Grip Strength 13.9 kg    Single Leg Stand 2.7 seconds              Nutrition Therapy Plan and Nutrition Goals:  Nutrition Therapy & Goals - 03/20/23 1425       Intervention Plan   Intervention Prescribe, educate and counsel regarding individualized specific dietary modifications aiming towards targeted core components such as weight, hypertension, lipid management, diabetes, heart failure and other comorbidities.;Nutrition handout(s) given to patient.    Expected Outcomes Short Term Goal: Understand basic principles of dietary content, such as calories, fat, sodium, cholesterol and nutrients.;Long Term Goal: Adherence to prescribed nutrition plan.             Nutrition Assessments:  MEDIFICTS Score Key: >=70 Need to make  dietary changes  40-70 Heart Healthy Diet <= 40 Therapeutic Level Cholesterol Diet  Flowsheet Row PULMONARY REHAB OTHER RESP ORIENTATION from 03/20/2023 in [facility] CARDIAC REH[facility]ILITATION  Picture Your Plate Total Score on Admission 47      Picture Your Plate Scores: <16 Unhealthy dietary pattern with much room for improvement. 41-50 Dietary pattern unlikely to meet recommendations for good health and room for improvement. 51-60 More healthful dietary pattern, with  some room for improvement.  >60 Healthy dietary pattern, although there may be some specific behaviors that could be improved.    Nutrition Goals Re-Evaluation:   Nutrition Goals Discharge (Final Nutrition Goals Re-Evaluation):   Psychosocial: Target Goals: Acknowledge presence or absence of significant depression and/or stress, maximize coping skills, provide positive support system. Participant is able to verbalize types and ability to use techniques and skills needed for reducing stress and depression.  Initial Review & Psychosocial Screening:  Initial Psych Review & Screening - 03/19/23 1342       Initial Review   Current issues with History of Depression;Current Psychotropic Meds;Current Sleep Concerns;Current Stress Concerns    Source of Stress Concerns Chronic Illness;Family      Family Dynamics   Good Support System? Yes      Barriers   Psychosocial barriers to participate in program The patient should benefit from training in stress management and relaxation.      Screening Interventions   Interventions To provide support and resources with identified psychosocial needs;Encouraged to exercise;Provide feedback about the scores to participant    Expected Outcomes Short Term goal: Utilizing psychosocial counselor, staff and physician to assist with identification of specific Stressors or current issues interfering with healing process. Setting desired goal for each stressor or current issue identified.;Long  Term Goal: Stressors or current issues are controlled or eliminated.;Short Term goal: Identification and review with participant of any Quality of Life or Depression concerns found by scoring the questionnaire.;Long Term goal: The participant improves quality of Life and PHQ9 Scores as seen by post scores and/or verbalization of changes             Quality of Life Scores:  Scores of 19 and below usually indicate a poorer quality of life in these areas.  A difference of  2-3 points is a clinically meaningful difference.  A difference of 2-3 points in the total score of the Quality of Life Index has been associated with significant improvement in overall quality of life, self-image, physical symptoms, and general health in studies assessing change in quality of life.   PHQ-9: Review Flowsheet       03/20/2023 03/24/2016  Depression screen PHQ 2/9  Decreased Interest 1 0  Down, Depressed, Hopeless 3 1  PHQ - 2 Score 4 1  Altered sleeping 3 -  Tired, decreased energy 3 -  Change in appetite 2 -  Feeling bad or failure about yourself  2 -  Trouble concentrating 0 -  Moving slowly or fidgety/restless 1 -  Suicidal thoughts 0 -  PHQ-9 Score 15 -  Difficult doing work/chores Extremely dIfficult -   Interpretation of Total Score  Total Score Depression Severity:  1-4 = Minimal depression, 5-9 = Mild depression, 10-14 = Moderate depression, 15-19 = Moderately severe depression, 20-27 = Severe depression   Psychosocial Evaluation and Intervention:  Psychosocial Evaluation - 03/19/23 1343       Psychosocial Evaluation & Interventions   Interventions Stress management education;Relaxation education;Encouraged to exercise with the program and follow exercise prescription    Comments Patient was referred to [facility] with ILD by Dr. Vassie Loll. She and her daughter both answered questions. Patient's son and grandson live with her but her 2 daughter's are her main support. She has a history of panic  attacks and depression both treated with cymbalta. She says she does get depressed at times due to having to stay home alot with her chronic health issues. She had back surgery in 2017 and  due to osteoporosis some of the pins and screws are getitng lose and this is causing chronic back pain. She also reports bursitis in her left shoulder. She says she is not sure if she will be able to exercise but would like to try and see what she is able to do. She has trouble both going and staying asleep. She takes Alprazolam prn for sleep which helps some. Her major stressors are her son that lives with her has liver cancer diagnosed last year and 3 years ago, her son passed away 2024/04/27 and she says she has a difficult time with increased depression during the month of December. Her goals for the program are to get stronger, increase her endurance, be more independent and improve her QOL. She is motivated to try the program but is aware that her chronic pain may be a barrier for her to complete the program.    Expected Outcomes Short Term: start the program and attend consistently. Long term: meet her personal goals.    Continue Psychosocial Services  Follow up required by staff             Psychosocial Re-Evaluation:   Psychosocial Discharge (Final Psychosocial Re-Evaluation):    Education: Education Goals: Education classes will be provided on a weekly basis, covering required topics. Participant will state understanding/return demonstration of topics presented.  Learning Barriers/Preferences:  Learning Barriers/Preferences - 03/19/23 1339       Learning Barriers/Preferences   Learning Barriers None    Learning Preferences Audio;Written Material;Skilled Demonstration             Education Topics: How Lungs Work and Diseases: - Discuss the anatomy of the lungs and diseases that can affect the lungs, such as COPD.   Exercise: -Discuss the importance of exercise, FITT principles of  exercise, normal and abnormal responses to exercise, and how to exercise safely.   Environmental Irritants: -Discuss types of environmental irritants and how to limit exposure to environmental irritants.   Meds/Inhalers and oxygen: - Discuss respiratory medications, definition of an inhaler and oxygen, and the proper way to use an inhaler and oxygen.   Energy Saving Techniques: - Discuss methods to conserve energy and decrease shortness of breath when performing activities of daily living.    Bronchial Hygiene / Breathing Techniques: - Discuss breathing mechanics, pursed-lip breathing technique,  proper posture, effective ways to clear airways, and other functional breathing techniques   Cleaning Equipment: - Provides group verbal and written instruction about the health risks of elevated stress, cause of high stress, and healthy ways to reduce stress.   Nutrition I: Fats: - Discuss the types of cholesterol, what cholesterol does to the body, and how cholesterol levels can be controlled.   Nutrition II: Labels: -Discuss the different components of food labels and how to read food labels.   Respiratory Infections: - Discuss the signs and symptoms of respiratory infections, ways to prevent respiratory infections, and the importance of seeking medical treatment when having a respiratory infection.   Stress I: Signs and Symptoms: - Discuss the causes of stress, how stress may lead to anxiety and depression, and ways to limit stress.   Stress II: Relaxation: -Discuss relaxation techniques to limit stress.   Oxygen for Home/Travel: - Discuss how to prepare for travel when on oxygen and proper ways to transport and store oxygen to ensure safety.   Knowledge Questionnaire Score:  Knowledge Questionnaire Score - 03/20/23 1425       Knowledge Questionnaire Score  Pre Score 16/18   with daughter            Core Components/Risk Factors/Patient Goals at Admission:   Personal Goals and Risk Factors at Admission - 03/20/23 1425       Core Components/Risk Factors/Patient Goals on Admission    Weight Management Yes;Weight Loss;Weight Maintenance    Intervention Weight Management: Develop a combined nutrition and exercise program designed to reach desired caloric intake, while maintaining appropriate intake of nutrient and fiber, sodium and fats, and appropriate energy expenditure required for the weight goal.;Weight Management: Provide education and appropriate resources to help participant work on and attain dietary goals.;Weight Management/Obesity: Establish reasonable short term and long term weight goals.;Obesity: Provide education and appropriate resources to help participant work on and attain dietary goals.    Admit Weight 159 lb 8 oz (72.3 kg)    Goal Weight: Short Term 155 lb (70.3 kg)    Goal Weight: Long Term 155 lb (70.3 kg)    Expected Outcomes Short Term: Continue to assess and modify interventions until short term weight is achieved;Long Term: Adherence to nutrition and physical activity/exercise program aimed toward attainment of established weight goal;Weight Maintenance: Understanding of the daily nutrition guidelines, which includes 25-35% calories from fat, 7% or less cal from saturated fats, less than 200mg  cholesterol, less than 1.5gm of sodium, & 5 or more servings of fruits and vegetables daily;Weight Loss: Understanding of general recommendations for a balanced deficit meal plan, which promotes 1-2 lb weight loss per week and includes a negative energy balance of [phone] kcal/d;Understanding recommendations for meals to include 15-35% energy as protein, 25-35% energy from fat, 35-60% energy from carbohydrates, less than 200mg  of dietary cholesterol, 20-35 gm of total fiber daily;Understanding of distribution of calorie intake throughout the day with the consumption of 4-5 meals/snacks    Improve shortness of breath with ADL's Yes    Intervention  Provide education, individualized exercise plan and daily activity instruction to help decrease symptoms of SOB with activities of daily living.    Expected Outcomes Short Term: Improve cardiorespiratory fitness to achieve a reduction of symptoms when performing ADLs;Long Term: Be able to perform more ADLs without symptoms or delay the onset of symptoms    Increase knowledge of respiratory medications and ability to use respiratory devices properly  Yes    Intervention Provide education and demonstration as needed of appropriate use of medications, inhalers, and oxygen therapy.    Expected Outcomes Short Term: Achieves understanding of medications use. Understands that oxygen is a medication prescribed by physician. Demonstrates appropriate use of inhaler and oxygen therapy.;Long Term: Maintain appropriate use of medications, inhalers, and oxygen therapy.    Heart Failure Yes    Intervention Provide a combined exercise and nutrition program that is supplemented with education, support and counseling about heart failure. Directed toward relieving symptoms such as shortness of breath, decreased exercise tolerance, and extremity edema.    Expected Outcomes Improve functional capacity of life;Short term: Attendance in program 2-3 days a week with increased exercise capacity. Reported lower sodium intake. Reported increased fruit and vegetable intake. Reports medication compliance.;Short term: Daily weights obtained and reported for increase. Utilizing diuretic protocols set by physician.;Long term: Adoption of self-care skills and reduction of barriers for early signs and symptoms recognition and intervention leading to self-care maintenance.    Hypertension Yes    Intervention Provide education on lifestyle modifcations including regular physical activity/exercise, weight management, moderate sodium restriction and increased consumption of fresh fruit, vegetables, and low  fat dairy, alcohol moderation, and  smoking cessation.;Monitor prescription use compliance.    Expected Outcomes Short Term: Continued assessment and intervention until BP is < 140/57mm HG in hypertensive participants. < 130/71mm HG in hypertensive participants with diabetes, heart failure or chronic kidney disease.;Long Term: Maintenance of blood pressure at goal levels.    Lipids Yes    Intervention Provide education and support for participant on nutrition & aerobic/resistive exercise along with prescribed medications to achieve LDL 70mg , HDL >40mg .    Expected Outcomes Short Term: Participant states understanding of desired cholesterol values and is compliant with medications prescribed. Participant is following exercise prescription and nutrition guidelines.;Long Term: Cholesterol controlled with medications as prescribed, with individualized exercise RX and with personalized nutrition plan. Value goals: LDL < 70mg , HDL > 40 mg.             Core Components/Risk Factors/Patient Goals Review:    Core Components/Risk Factors/Patient Goals at Discharge (Final Review):    ITP Comments:  ITP Comments     Row Name 03/20/23 1414 03/21/23 1310 04/17/23 1442 05/16/23 1144 06/13/23 1407   ITP Comments Patient attend orientation today.  Patient is attending Pulmonary Rehabilitation Program.  Documentation for diagnosis can be found in [facility] OV 03/06/23 with Dr. Vassie Loll.  Reviewed medical chart, RPE/RPD, gym safety, and program guidelines.  Patient was fitted to equipment they will be using during rehab.  Patient is scheduled to start exercise on Tuesday 03/27/23 at 1500.   Initial ITP created and sent for review and signature by Dr. Erick Blinks, Medical Director for Pulmonary Rehabilitation Program. 30 day review completed. ITP sent to Dr.Jehanzeb Memon, Medical Director of  Pulmonary Rehab. Continue with ITP unless changes are made by physician.  Only completed orientation thus far. 30 day review completed. ITP sent to Dr.Jehanzeb  Memon, Medical Director of  Pulmonary Rehab. Continue with ITP unless changes are made by physician.  Only completed orientation thus far still. 30 day review completed. ITP sent to Dr.Jehanzeb Memon, Medical Director of  Pulmonary Rehab. Continue with ITP unless changes are made by physician.  Only completed orientation thus far still in December. We will send letter as she has not returned our calls. 30 day review completed. ITP sent to Dr.Jehanzeb Memon, Medical Director of  Pulmonary Rehab. Continue with ITP unless changes are made by physician.  Daughter called to confirm appt but still has not returned.  We will try letter again.            Comments: 30 day review

## 2023-06-14 ENCOUNTER — Encounter (HOSPITAL_COMMUNITY): Payer: PPO

## 2023-06-19 ENCOUNTER — Encounter (HOSPITAL_COMMUNITY): Payer: PPO | Attending: Pulmonary Disease

## 2023-06-19 DIAGNOSIS — J849 Interstitial pulmonary disease, unspecified: Secondary | ICD-10-CM | POA: Insufficient documentation

## 2023-06-21 ENCOUNTER — Encounter (HOSPITAL_COMMUNITY): Payer: PPO

## 2023-06-26 ENCOUNTER — Encounter (HOSPITAL_COMMUNITY): Payer: PPO

## 2023-06-28 ENCOUNTER — Encounter (HOSPITAL_COMMUNITY): Payer: PPO

## 2023-07-03 ENCOUNTER — Encounter (HOSPITAL_COMMUNITY): Payer: PPO

## 2023-07-03 ENCOUNTER — Encounter (HOSPITAL_COMMUNITY): Payer: Self-pay

## 2023-07-03 DIAGNOSIS — J849 Interstitial pulmonary disease, unspecified: Secondary | ICD-10-CM

## 2023-07-03 NOTE — Progress Notes (Signed)
 Discharge Progress Report  Patient Details  Name: [name] MRN: [id number] Date of Birth: October 22, 1932 Referring Provider:   Flowsheet Row PULMONARY REHAB OTHER RESP ORIENTATION from 03/20/2023 in [facility] CARDIAC REHABILITATION  Referring Provider Cyril Mourning MD        Number of Visits: 1  Reason for Discharge:  Early Exit:  Lack of attendance  Smoking History:  Social History   Tobacco Use  Smoking Status Former   Current packs/day: 0.00   Average packs/day: 1 pack/day for 64.0 years (64.0 ttl pk-yrs)   Types: Cigarettes   Start date: 12/24/1947   Quit date: 11/17/2001   Years since quitting: 21.6  Smokeless Tobacco Never  Tobacco Comments   Quit smoking in 2003    Diagnosis:  ILD (interstitial lung disease) (HCC)  ADL UCSD:   Initial Exercise Prescription:   Discharge Exercise Prescription (Final Exercise Prescription Changes):   Functional Capacity:   Psychological, QOL, Others - Outcomes: PHQ 2/9:    03/20/2023   12:28 PM 03/24/2016   12:47 PM  Depression screen PHQ 2/9  Decreased Interest 1 0  Down, Depressed, Hopeless 3 1  PHQ - 2 Score 4 1  Altered sleeping 3   Tired, decreased energy 3   Change in appetite 2   Feeling bad or failure about yourself  2   Trouble concentrating 0   Moving slowly or fidgety/restless 1   Suicidal thoughts 0   PHQ-9 Score 15   Difficult doing work/chores Extremely dIfficult     Quality of Life:   Goals reviewed with patient; copy given to patient.

## 2023-07-03 NOTE — Progress Notes (Addendum)
 Pulmonary Individual Treatment Plan  Patient Details  Name: [name] MRN: [id number] Date of Birth: 10-22-1932 Referring Provider:   Flowsheet Row PULMONARY REHAB OTHER RESP ORIENTATION from 03/20/2023 in [facility] CARDIAC REHABILITATION  Referring Provider Cyril Mourning MD       Initial Encounter Date:  Flowsheet Row PULMONARY REHAB OTHER RESP ORIENTATION from 03/20/2023 in [place] CARDIAC REHABILITATION  Date 03/20/23       Visit Diagnosis: ILD (interstitial lung disease) (HCC)  Patient's Home Medications on Admission:   Current Outpatient Medications:    acetaminophen (TYLENOL) 500 MG tablet, Take 500 mg by mouth every 6 (six) hours as needed for headache or moderate pain., Disp: , Rfl:    ALPRAZolam (XANAX) 0.5 MG tablet, Take 0.5 mg by mouth daily as needed for anxiety or sleep., Disp: , Rfl:    apixaban (ELIQUIS) 5 MG TABS tablet, Take 1 tablet (5 mg total) by mouth 2 (two) times daily., Disp: 180 tablet, Rfl: 3   ascorbic acid (VITAMIN C) 500 MG tablet, Take 1 tablet (500 mg total) by mouth daily., Disp: 30 tablet, Rfl: 2   cyanocobalamin (VITAMIN B12) 1000 MCG tablet, Take 1 tablet (1,000 mcg total) by mouth daily., Disp: 90 tablet, Rfl: 3   diltiazem (CARDIZEM CD) 180 MG 24 hr capsule, Take 1 capsule (180 mg total) by mouth daily before breakfast., Disp: 90 capsule, Rfl: 3   diltiazem (TIAZAC) 120 MG 24 hr capsule, Take 1 capsule (120 mg total) by mouth at bedtime., Disp: 90 capsule, Rfl: 1   DULoxetine (CYMBALTA) 60 MG capsule, Take 60 mg by mouth daily., Disp: , Rfl:    estradiol (ESTRACE) 0.1 MG/GM vaginal cream, Discard plastic applicator. Insert a blueberry size amount (approximately 1 gram) of cream on fingertip inside vagina at bedtime every night for 1 week then 2 nights per week for long term use., Disp: 30 g, Rfl: 3   fluticasone (CUTIVATE) 0.05 % cream, Apply 1 application topically 2 (two) times daily as needed (skin tears)., Disp: , Rfl:    furosemide  (LASIX) 40 MG tablet, Take 1 tablet (40 mg total) by mouth daily., Disp: 135 tablet, Rfl: 3   hydrocortisone (ANUSOL-HC) 2.5 % rectal cream, Place 1 application rectally 2 (two) times daily., Disp: 30 g, Rfl: 1   isosorbide mononitrate (IMDUR) 30 MG 24 hr tablet, Take 1 tablet (30 mg total) by mouth daily., Disp: 90 tablet, Rfl: 3   magnesium oxide (MAG-OX) 400 (240 Mg) MG tablet, Take 1 tablet by mouth daily., Disp: , Rfl:    metoprolol succinate (TOPROL-XL) 50 MG 24 hr tablet, Take 1 tablet (50 mg total) by mouth 2 (two) times daily. Take with or immediately following a meal., Disp: 180 tablet, Rfl: 3   Multiple Vitamins-Minerals (HAIR/SKIN/NAILS) CAPS, Take 1 tablet by mouth daily., Disp: , Rfl:    Naphazoline HCl (CLEAR EYES OP), Place 1 drop into both eyes daily as needed (itching)., Disp: , Rfl:    nitroGLYCERIN (NITROSTAT) 0.4 MG SL tablet, DISSOLVE ONE TABLET UNDER TONGUE EVERY 5 MINUTES UP TO 3 DOSES AS NEEDED FOR CHEST PAIN., Disp: 25 tablet, Rfl: 6   pantoprazole (PROTONIX) 40 MG tablet, TAKE ONE TABLET BY MOUTH TWICE DAILY BEFORE MEALS, Disp: 60 tablet, Rfl: 5   potassium chloride (KLOR-CON) 10 MEQ tablet, Take 2 tablets (20 mEq total) by mouth 2 (two) times daily., Disp: 120 tablet, Rfl: 6   traMADol (ULTRAM) 50 MG tablet, Take 1 tablet (50 mg total) by mouth every  6 (six) hours as needed., Disp: 90 tablet, Rfl: 3   TRELEGY ELLIPTA 100-62.5-25 MCG/ACT AEPB, Take 1 puff by mouth daily., Disp: , Rfl:    triamcinolone cream (KENALOG) 0.1 %, Apply topically 2 (two) times daily., Disp: , Rfl:    zinc sulfate 220 (50 Zn) MG capsule, Take 1 capsule (220 mg total) by mouth daily., Disp: 30 capsule, Rfl: 3  Past Medical History: Past Medical History:  Diagnosis Date   Anxiety    Arthritis    Atrial fibrillation (HCC)    Bursitis    Left shoulder   Cataract    CHF (congestive heart failure) (HCC)    CKD (chronic kidney disease)    stage 3-4   COPD (chronic obstructive pulmonary disease)  (HCC)    Coronary atherosclerosis of native coronary artery    a. s/p DES to LCx in 04/2013 b. cath in 11/2015 showing patent stent with 20% prox-LAD and 80% ostial RCA stenosis for which medical management was recommended due to small artery size   Depression    Diastolic heart failure (HCC)    EF 55-60%   Dysphagia, unspecified(787.20)    Dyspnea    Dysrhythmia    Essential hypertension    GERD (gastroesophageal reflux disease)    Hx Schatzki's ring, multiple EGD/ED last 01/06/2004   Gout    Headache    History of anemia    Hyperlipidemia    Internal hemorrhoids without mention of complication    MI (myocardial infarction) (HCC) 2006   Microscopic colitis 2003   Panic disorder without agoraphobia    Paresthesia    Pneumonia 12/2011   PVD (peripheral vascular disease) (HCC)    S/P colonoscopy 09/27/2001   internal hemorrhoids, desc colon inflam polyp, SB BX-chronic duodenitis, colitis   Sleep apnea    Thyroid disease     Tobacco Use: Social History   Tobacco Use  Smoking Status Former   Current packs/day: 0.00   Average packs/day: 1 pack/day for 64.0 years (64.0 ttl pk-yrs)   Types: Cigarettes   Start date: 12/24/1947   Quit date: 11/17/2001   Years since quitting: 21.6  Smokeless Tobacco Never  Tobacco Comments   Quit smoking in 2003    Labs: Review Flowsheet  More data exists      Latest Ref Rng & Units 11/09/2019 11/10/2019 06/16/2020 09/29/2020 09/30/2020  Labs for ITP Cardiac and Pulmonary Rehab  Cholestrol 0 - 200 mg/dL - 578  - - -  LDL (calc) 0 - 99 mg/dL - 98  - - -  HDL-C >46 mg/dL - 38  - - -  Trlycerides <150 mg/dL - 82  - 84  -  Hemoglobin A1c 4.8 - 5.6 % 7.0  - 7.0  - 7.1     Capillary Blood Glucose: Lab Results  Component Value Date   GLUCAP 88 11/11/2019   GLUCAP 100 (H) 11/11/2019   GLUCAP 111 (H) 11/10/2019   GLUCAP 136 (H) 11/10/2019   GLUCAP 117 (H) 11/10/2019     Pulmonary Assessment Scores:  UCSD: Self-administered rating of dyspnea  associated with activities of daily living (ADLs) 6-point scale (0 = "not at all" to 5 = "maximal or unable to do because of breathlessness")  Scoring Scores range from 0 to 120.  Minimally important difference is 5 units  CAT: CAT can identify the health impairment of COPD patients and is better correlated with disease progression.  CAT has a scoring range of zero to 40. The CAT score is  classified into four groups of low (less than 10), medium (10 - 20), high (21-30) and very high (31-40) based on the impact level of disease on health status. A CAT score over 10 suggests significant symptoms.  A worsening CAT score could be explained by an exacerbation, poor medication adherence, poor inhaler technique, or progression of COPD or comorbid conditions.  CAT MCID is 2 points  mMRC: mMRC (Modified Medical Research Council) Dyspnea Scale is used to assess the degree of baseline functional disability in patients of respiratory disease due to dyspnea. No minimal important difference is established. A decrease in score of 1 point or greater is considered a positive change.   Pulmonary Function Assessment:   Exercise Target Goals: Exercise Program Goal: Individual exercise prescription set using results from initial 6 min walk test and THRR while considering  patient's activity barriers and safety.   Exercise Prescription Goal: Initial exercise prescription builds to 30-45 minutes a day of aerobic activity, 2-3 days per week.  Home exercise guidelines will be given to patient during program as part of exercise prescription that the participant will acknowledge.  Activity Barriers & Risk Stratification:   6 Minute Walk:   Oxygen Initial Assessment:   Oxygen Re-Evaluation:   Oxygen Discharge (Final Oxygen Re-Evaluation):   Initial Exercise Prescription:   Perform Capillary Blood Glucose checks as needed.  Exercise Prescription Changes:   Exercise Comments:   Exercise Goals and  Review:   Exercise Goals Re-Evaluation :   Discharge Exercise Prescription (Final Exercise Prescription Changes):   Nutrition:  Target Goals: Understanding of nutrition guidelines, daily intake of sodium 1500mg , cholesterol 200mg , calories 30% from fat and 7% or less from saturated fats, daily to have 5 or more servings of fruits and vegetables.  Biometrics:    Nutrition Therapy Plan and Nutrition Goals:   Nutrition Assessments:  MEDIFICTS Score Key: >=70 Need to make dietary changes  40-70 Heart Healthy Diet <= 40 Therapeutic Level Cholesterol Diet  Flowsheet Row PULMONARY REHAB OTHER RESP ORIENTATION from 03/20/2023 in [facility] C[facility] REHABILITATION  Picture Your Plate Total Score on Admission 47      Picture Your Plate Scores: <82 Unhealthy dietary pattern with much room for improvement. 41-50 Dietary pattern unlikely to meet recommendations for good health and room for improvement. 51-60 More healthful dietary pattern, with some room for improvement.  >60 Healthy dietary pattern, although there may be some specific behaviors that could be improved.    Nutrition Goals Re-Evaluation:   Nutrition Goals Discharge (Final Nutrition Goals Re-Evaluation):   Psychosocial: Target Goals: Acknowledge presence or absence of significant depression and/or stress, maximize coping skills, provide positive support system. Participant is able to verbalize types and ability to use techniques and skills needed for reducing stress and depression.  Initial Review & Psychosocial Screening:   Quality of Life Scores:  Scores of 19 and below usually indicate a poorer quality of life in these areas.  A difference of  2-3 points is a clinically meaningful difference.  A difference of 2-3 points in the total score of the Quality of Life Index has been associated with significant improvement in overall quality of life, self-image, physical symptoms, and general health in studies  assessing change in quality of life.   PHQ-9: Review Flowsheet       03/20/2023 03/24/2016  Depression screen PHQ 2/9  Decreased Interest 1 0  Down, Depressed, Hopeless 3 1  PHQ - 2 Score 4 1  Altered sleeping 3 -  Tired, decreased  energy 3 -  Change in appetite 2 -  Feeling bad or failure about yourself  2 -  Trouble concentrating 0 -  Moving slowly or fidgety/restless 1 -  Suicidal thoughts 0 -  PHQ-9 Score 15 -  Difficult doing work/chores Extremely dIfficult -   Interpretation of Total Score  Total Score Depression Severity:  1-4 = Minimal depression, 5-9 = Mild depression, 10-14 = Moderate depression, 15-19 = Moderately severe depression, 20-27 = Severe depression   Psychosocial Evaluation and Intervention:   Psychosocial Re-Evaluation:   Psychosocial Discharge (Final Psychosocial Re-Evaluation):    Education: Education Goals: Education classes will be provided on a weekly basis, covering required topics. Participant will state understanding/return demonstration of topics presented.  Learning Barriers/Preferences:   Education Topics: How Lungs Work and Diseases: - Discuss the anatomy of the lungs and diseases that can affect the lungs, such as COPD.   Exercise: -Discuss the importance of exercise, FITT principles of exercise, normal and abnormal responses to exercise, and how to exercise safely.   Environmental Irritants: -Discuss types of environmental irritants and how to limit exposure to environmental irritants.   Meds/Inhalers and oxygen: - Discuss respiratory medications, definition of an inhaler and oxygen, and the proper way to use an inhaler and oxygen.   Energy Saving Techniques: - Discuss methods to conserve energy and decrease shortness of breath when performing activities of daily living.    Bronchial Hygiene / Breathing Techniques: - Discuss breathing mechanics, pursed-lip breathing technique,  proper posture, effective ways to clear  airways, and other functional breathing techniques   Cleaning Equipment: - Provides group verbal and written instruction about the health risks of elevated stress, cause of high stress, and healthy ways to reduce stress.   Nutrition I: Fats: - Discuss the types of cholesterol, what cholesterol does to the body, and how cholesterol levels can be controlled.   Nutrition II: Labels: -Discuss the different components of food labels and how to read food labels.   Respiratory Infections: - Discuss the signs and symptoms of respiratory infections, ways to prevent respiratory infections, and the importance of seeking medical treatment when having a respiratory infection.   Stress I: Signs and Symptoms: - Discuss the causes of stress, how stress may lead to anxiety and depression, and ways to limit stress.   Stress II: Relaxation: -Discuss relaxation techniques to limit stress.   Oxygen for Home/Travel: - Discuss how to prepare for travel when on oxygen and proper ways to transport and store oxygen to ensure safety.   Knowledge Questionnaire Score:   Core Components/Risk Factors/Patient Goals at Admission:   Core Components/Risk Factors/Patient Goals Review:    Core Components/Risk Factors/Patient Goals at Discharge (Final Review):    ITP Comments:  ITP Comments     Row Name 05/16/23 1144 06/13/23 1407         ITP Comments 30 day review completed. ITP sent to Dr.Jehanzeb Memon, Medical Director of  Pulmonary Rehab. Continue with ITP unless changes are made by physician.  Only completed orientation thus far still in December. We will send letter as she has not returned our calls. 30 day review completed. ITP sent to Dr.Jehanzeb Memon, Medical Director of  Pulmonary Rehab. Continue with ITP unless changes are made by physician.  Daughter called to confirm appt but still has not returned.  We will try letter again.               Comments: Discharge ITP

## 2023-07-04 DIAGNOSIS — K219 Gastro-esophageal reflux disease without esophagitis: Secondary | ICD-10-CM | POA: Diagnosis not present

## 2023-07-04 DIAGNOSIS — Z66 Do not resuscitate: Secondary | ICD-10-CM | POA: Diagnosis not present

## 2023-07-04 DIAGNOSIS — E785 Hyperlipidemia, unspecified: Secondary | ICD-10-CM | POA: Diagnosis not present

## 2023-07-04 DIAGNOSIS — J449 Chronic obstructive pulmonary disease, unspecified: Secondary | ICD-10-CM | POA: Diagnosis not present

## 2023-07-04 DIAGNOSIS — M10042 Idiopathic gout, left hand: Secondary | ICD-10-CM | POA: Diagnosis not present

## 2023-07-04 DIAGNOSIS — I130 Hypertensive heart and chronic kidney disease with heart failure and stage 1 through stage 4 chronic kidney disease, or unspecified chronic kidney disease: Secondary | ICD-10-CM | POA: Diagnosis not present

## 2023-07-04 DIAGNOSIS — I509 Heart failure, unspecified: Secondary | ICD-10-CM | POA: Diagnosis not present

## 2023-07-04 DIAGNOSIS — R079 Chest pain, unspecified: Secondary | ICD-10-CM | POA: Diagnosis not present

## 2023-07-04 DIAGNOSIS — M79642 Pain in left hand: Secondary | ICD-10-CM | POA: Diagnosis not present

## 2023-07-04 DIAGNOSIS — N189 Chronic kidney disease, unspecified: Secondary | ICD-10-CM | POA: Diagnosis not present

## 2023-07-04 DIAGNOSIS — M109 Gout, unspecified: Secondary | ICD-10-CM | POA: Diagnosis not present

## 2023-07-04 DIAGNOSIS — R918 Other nonspecific abnormal finding of lung field: Secondary | ICD-10-CM | POA: Diagnosis not present

## 2023-07-04 DIAGNOSIS — Z87891 Personal history of nicotine dependence: Secondary | ICD-10-CM | POA: Diagnosis not present

## 2023-07-05 ENCOUNTER — Encounter (HOSPITAL_COMMUNITY): Payer: PPO

## 2023-07-10 ENCOUNTER — Encounter (HOSPITAL_COMMUNITY): Payer: PPO

## 2023-07-10 DIAGNOSIS — R601 Generalized edema: Secondary | ICD-10-CM | POA: Diagnosis not present

## 2023-07-10 DIAGNOSIS — I2510 Atherosclerotic heart disease of native coronary artery without angina pectoris: Secondary | ICD-10-CM | POA: Diagnosis not present

## 2023-07-10 DIAGNOSIS — M109 Gout, unspecified: Secondary | ICD-10-CM | POA: Diagnosis not present

## 2023-07-10 DIAGNOSIS — Z6828 Body mass index (BMI) 28.0-28.9, adult: Secondary | ICD-10-CM | POA: Diagnosis not present

## 2023-07-12 ENCOUNTER — Encounter (HOSPITAL_COMMUNITY): Payer: PPO

## 2023-07-16 DIAGNOSIS — M109 Gout, unspecified: Secondary | ICD-10-CM | POA: Diagnosis not present

## 2023-07-16 DIAGNOSIS — I2510 Atherosclerotic heart disease of native coronary artery without angina pectoris: Secondary | ICD-10-CM | POA: Diagnosis not present

## 2023-07-17 ENCOUNTER — Encounter: Payer: Self-pay | Admitting: Internal Medicine

## 2023-07-17 ENCOUNTER — Encounter (HOSPITAL_COMMUNITY): Payer: PPO

## 2023-07-19 ENCOUNTER — Encounter (HOSPITAL_COMMUNITY): Payer: PPO

## 2023-07-24 ENCOUNTER — Encounter (HOSPITAL_COMMUNITY): Payer: PPO

## 2023-07-25 DIAGNOSIS — N183 Chronic kidney disease, stage 3 unspecified: Secondary | ICD-10-CM | POA: Diagnosis not present

## 2023-07-25 DIAGNOSIS — Z6828 Body mass index (BMI) 28.0-28.9, adult: Secondary | ICD-10-CM | POA: Diagnosis not present

## 2023-07-25 DIAGNOSIS — M109 Gout, unspecified: Secondary | ICD-10-CM | POA: Diagnosis not present

## 2023-07-25 DIAGNOSIS — E782 Mixed hyperlipidemia: Secondary | ICD-10-CM | POA: Diagnosis not present

## 2023-07-25 DIAGNOSIS — M2550 Pain in unspecified joint: Secondary | ICD-10-CM | POA: Diagnosis not present

## 2023-07-25 DIAGNOSIS — M545 Low back pain, unspecified: Secondary | ICD-10-CM | POA: Diagnosis not present

## 2023-07-25 DIAGNOSIS — I739 Peripheral vascular disease, unspecified: Secondary | ICD-10-CM | POA: Diagnosis not present

## 2023-07-25 DIAGNOSIS — R5383 Other fatigue: Secondary | ICD-10-CM | POA: Diagnosis not present

## 2023-07-26 ENCOUNTER — Encounter (HOSPITAL_COMMUNITY): Payer: PPO

## 2023-08-01 ENCOUNTER — Inpatient Hospital Stay: Payer: PPO | Attending: Physician Assistant

## 2023-08-08 ENCOUNTER — Inpatient Hospital Stay: Payer: PPO | Admitting: Physician Assistant

## 2023-08-15 DIAGNOSIS — M109 Gout, unspecified: Secondary | ICD-10-CM | POA: Diagnosis not present

## 2023-08-15 DIAGNOSIS — I2510 Atherosclerotic heart disease of native coronary artery without angina pectoris: Secondary | ICD-10-CM | POA: Diagnosis not present

## 2023-08-21 ENCOUNTER — Inpatient Hospital Stay: Attending: Physician Assistant

## 2023-08-21 DIAGNOSIS — E538 Deficiency of other specified B group vitamins: Secondary | ICD-10-CM | POA: Insufficient documentation

## 2023-08-21 DIAGNOSIS — I4891 Unspecified atrial fibrillation: Secondary | ICD-10-CM | POA: Diagnosis not present

## 2023-08-21 DIAGNOSIS — K648 Other hemorrhoids: Secondary | ICD-10-CM | POA: Insufficient documentation

## 2023-08-21 DIAGNOSIS — Z7901 Long term (current) use of anticoagulants: Secondary | ICD-10-CM | POA: Diagnosis not present

## 2023-08-21 DIAGNOSIS — Z87891 Personal history of nicotine dependence: Secondary | ICD-10-CM | POA: Diagnosis not present

## 2023-08-21 DIAGNOSIS — Z800 Family history of malignant neoplasm of digestive organs: Secondary | ICD-10-CM | POA: Diagnosis not present

## 2023-08-21 DIAGNOSIS — D500 Iron deficiency anemia secondary to blood loss (chronic): Secondary | ICD-10-CM | POA: Insufficient documentation

## 2023-08-21 DIAGNOSIS — N1832 Chronic kidney disease, stage 3b: Secondary | ICD-10-CM | POA: Diagnosis not present

## 2023-08-21 LAB — CBC WITH DIFFERENTIAL/PLATELET
Abs Immature Granulocytes: 0.12 10*3/uL — ABNORMAL HIGH (ref 0.00–0.07)
Basophils Absolute: 0.1 10*3/uL (ref 0.0–0.1)
Basophils Relative: 2 %
Eosinophils Absolute: 0.2 10*3/uL (ref 0.0–0.5)
Eosinophils Relative: 3 %
HCT: 40.1 % (ref 36.0–46.0)
Hemoglobin: 13.7 g/dL (ref 12.0–15.0)
Immature Granulocytes: 2 %
Lymphocytes Relative: 41 %
Lymphs Abs: 3.1 10*3/uL (ref 0.7–4.0)
MCH: 30.5 pg (ref 26.0–34.0)
MCHC: 34.2 g/dL (ref 30.0–36.0)
MCV: 89.3 fL (ref 80.0–100.0)
Monocytes Absolute: 0.5 10*3/uL (ref 0.1–1.0)
Monocytes Relative: 7 %
Neutro Abs: 3.5 10*3/uL (ref 1.7–7.7)
Neutrophils Relative %: 45 %
Platelets: 358 10*3/uL (ref 150–400)
RBC: 4.49 MIL/uL (ref 3.87–5.11)
RDW: 13.4 % (ref 11.5–15.5)
WBC: 7.6 10*3/uL (ref 4.0–10.5)
nRBC: 0 % (ref 0.0–0.2)

## 2023-08-21 LAB — IRON AND TIBC
Iron: 79 ug/dL (ref 28–170)
Saturation Ratios: 24 % (ref 10.4–31.8)
TIBC: 334 ug/dL (ref 250–450)
UIBC: 255 ug/dL

## 2023-08-21 LAB — BASIC METABOLIC PANEL WITH GFR
Anion gap: 13 (ref 5–15)
BUN: 26 mg/dL — ABNORMAL HIGH (ref 8–23)
CO2: 24 mmol/L (ref 22–32)
Calcium: 9 mg/dL (ref 8.9–10.3)
Chloride: 97 mmol/L — ABNORMAL LOW (ref 98–111)
Creatinine, Ser: 1.39 mg/dL — ABNORMAL HIGH (ref 0.44–1.00)
GFR, Estimated: 36 mL/min — ABNORMAL LOW (ref >60)
Glucose, Bld: 175 mg/dL — ABNORMAL HIGH (ref 70–99)
Potassium: 3.6 mmol/L (ref 3.5–5.1)
Sodium: 134 mmol/L — ABNORMAL LOW (ref 135–145)

## 2023-08-21 LAB — VITAMIN B12: Vitamin B-12: 1160 pg/mL — ABNORMAL HIGH (ref 180–914)

## 2023-08-21 LAB — FERRITIN: Ferritin: 129 ng/mL (ref 11–307)

## 2023-08-25 LAB — METHYLMALONIC ACID, SERUM: Methylmalonic Acid, Quantitative: 263 nmol/L (ref 0–378)

## 2023-08-31 DIAGNOSIS — K828 Other specified diseases of gallbladder: Secondary | ICD-10-CM | POA: Diagnosis not present

## 2023-08-31 DIAGNOSIS — R1011 Right upper quadrant pain: Secondary | ICD-10-CM | POA: Diagnosis not present

## 2023-08-31 DIAGNOSIS — K219 Gastro-esophageal reflux disease without esophagitis: Secondary | ICD-10-CM | POA: Diagnosis not present

## 2023-08-31 DIAGNOSIS — K838 Other specified diseases of biliary tract: Secondary | ICD-10-CM | POA: Diagnosis not present

## 2023-09-03 NOTE — Progress Notes (Signed)
 [facility]: [name] [address], [place] [address]   CLINIC:  Medical Oncology/Hematology  PCP:  [name], [name], PA-C [address] [place] [address] [phone]   REASON FOR VISIT:  Follow-up for iron deficiency anemia   CURRENT THERAPY: Intermittent IV iron infusions (last given 09/06/2021)  INTERVAL HISTORY:   [name] 89 y.o. female returns for routine follow-up of iron deficiency anemia.  She was last seen by Sheril Dines PA-C on 04/06/2023.  She is accompanied today by her daughter.  At today's visit, she reports feeling fair.  She has several other complaints unrelated to today's visit, particularly related to her arthritis, chronic shortness of breath, and atrial fibrillation.  She reports intermittent rectal bleeding from hemorrhoids, most recently about 3 weeks ago.   She had some mild nosebleeds during allergy season, but no "gushing" epistaxis.  No melena.  She takes Eliquis  for her A-fib.  She reports ongoing fatigue, dyspnea on exertion, shortness of breath at rest, and intermittent chest pain.   She has some mild ice pica.  She has intermittent headaches.    She is taking vitamin B12 1000 mcg daily.  She has little to no energy and 25% appetite.  She endorses that she is maintaining a stable weight.  ASSESSMENT & PLAN:  1.  Normocytic anemia, secondary to iron deficiency (GI blood loss) and B12 deficiency - Iron deficiency anemia secondary to GI blood loss from hemorrhoids - She follows with GI, was also evaluated by general surgery but is not a candidate for surgical intervention or banding - She takes Eliquis  in the setting of atrial fibrillation - She has CKD stage IIIb - Colonoscopy (06/28/2020): Internal and external hemorrhoids - EGD (02/16/2020): Normal esophagus, erythematous mucosa of the stomach, normal duodenum - She could not tolerate iron supplement due to constipation and lack of improvement. - Taking vitamin B12 1000  mcg daily since December 2024  - Last iron was Feraheme  x 1 on 10/12/2022 - She has ongoing fatigue - Most recent labs (08/21/2023): Hgb 13.7/MCV 89.3 Ferritin 129, iron saturation 24%.   Baseline CKD stage IIIb with creatinine 1.39/GFR 36. Vitamin B12 is 1160, normalized MMA . - PLAN: No indication for IV iron at this time. - Can DECREASE vitamin B12 to 1000 mcg every other day.   - Repeat labs and RTC in 6 months followed by office visit - We will defer management of hemorrhoids to gastroenterology and surgery.  PLAN SUMMARY: >> Labs in 6 months = CBC/D, BMP, ferritin, iron/TIBC, B12, MMA >> OFFICE visit in 6 months (1 week after labs)     REVIEW OF SYSTEMS:   Review of Systems  Constitutional:  Positive for fatigue. Negative for appetite change, chills, diaphoresis, fever and unexpected weight change.  HENT:   Negative for lump/mass and nosebleeds.   Eyes:  Negative for eye problems.  Respiratory:  Positive for cough and shortness of breath. Negative for hemoptysis.   Cardiovascular:  Positive for chest pain and palpitations. Negative for leg swelling.  Gastrointestinal:  Positive for diarrhea and nausea. Negative for abdominal pain, blood in stool, constipation and vomiting.  Genitourinary:  Positive for frequency. Negative for bladder incontinence and hematuria.   Musculoskeletal:  Positive for arthralgias, back pain and neck pain. Negative for myalgias.  Neurological:  Positive for headaches and numbness. Negative for dizziness and light-headedness.  Hematological:  Does not bruise/bleed easily.  Psychiatric/Behavioral:  Positive for depression and sleep disturbance. The patient is nervous/anxious.  PHYSICAL EXAM:  ECOG PERFORMANCE STATUS: 2 - Symptomatic, <50% confined to bed  Vitals:   09/04/23 1343  BP: 131/67  Pulse: 62  Resp: 20  Temp: (!) 96.7 F (35.9 C)  SpO2: 96%   Physical Exam Constitutional:      Appearance: Normal appearance. She is obese.   Cardiovascular:     Rate and Rhythm: Rhythm irregular.     Heart sounds: Normal heart sounds.  Pulmonary:     Breath sounds: Normal breath sounds.  Neurological:     General: No focal deficit present.     Mental Status: Mental status is at baseline.  Psychiatric:        Behavior: Behavior normal. Behavior is cooperative.     PAST MEDICAL/SURGICAL HISTORY:  Past Medical History:  Diagnosis Date   Anxiety    Arthritis    Atrial fibrillation (HCC)    Bursitis    Left shoulder   Cataract    CHF (congestive heart failure) (HCC)    CKD (chronic kidney disease)    stage 3-4   COPD (chronic obstructive pulmonary disease) (HCC)    Coronary atherosclerosis of native coronary artery    a. s/p DES to LCx in 04/2013 b. cath in 11/2015 showing patent stent with 20% prox-LAD and 80% ostial RCA stenosis for which medical management was recommended due to small artery size   Depression    Diastolic heart failure (HCC)    EF 55-60%   Dysphagia, unspecified(787.20)    Dyspnea    Dysrhythmia    Essential hypertension    GERD (gastroesophageal reflux disease)    Hx Schatzki's ring, multiple EGD/ED last 01/06/2004   Gout    Headache    History of anemia    Hyperlipidemia    Internal hemorrhoids without mention of complication    MI (myocardial infarction) (HCC) 2006   Microscopic colitis 2003   Panic disorder without agoraphobia    Paresthesia    Pneumonia 12/2011   PVD (peripheral vascular disease) (HCC)    S/P colonoscopy 09/27/2001   internal hemorrhoids, desc colon inflam polyp, SB BX-chronic duodenitis, colitis   Sleep apnea    Thyroid  disease    Past Surgical History:  Procedure Laterality Date   ABDOMINAL HYSTERECTOMY     ABDOMINAL HYSTERECTOMY     AGILE CAPSULE N/A 08/30/2020   Procedure: AGILE CAPSULE;  Surgeon: Suzette Espy, MD;  [facility];  Service: Endoscopy;  Laterality: N/A;  7:30am   ANTERIOR AND POSTERIOR REPAIR     with resection of vagina    ANTERIOR LAT LUMBAR FUSION N/A 08/01/2016   Procedure: Lumbar Two-Lumbar Five Transpsoas lateral interbody fusion with Lumbar Two-Three lateral plate fixation;  Surgeon: Raelene Bullocks Ditty, MD;  Location: [facility] OR;  Service: Neurosurgery;  Laterality: N/A;  L2-5 Transpsoas lateral interbody fusion with L2-3 lateral plate fixation   APPENDECTOMY     BACK SURGERY     BIOPSY  07/05/2015   Procedure: BIOPSY;  Surgeon: Suzette Espy, MD;  [facility];  Service: Endoscopy;;  gastric polyp biopsy, ascending colon biopsy   BIOPSY  02/16/2020   Procedure: BIOPSY;  Surgeon: Suzette Espy, MD;  [facility];  Service: Endoscopy;;   BIOPSY  06/28/2020   Procedure: BIOPSY;  Surgeon: Suzette Espy, MD;  [facility];  Service: Endoscopy;;   BLADDER SUSPENSION  11/09/2011   Procedure: TRANSVAGINAL TAPE (TVT) PROCEDURE;  Surgeon: Reggie Caper, MD;  Location: [facility] ORS;  Service: Urology;  Laterality: N/A;   bladder tack  06/2010   BREAST LUMPECTOMY  1998   left, benign   CARDIAC CATHETERIZATION     CARDIAC CATHETERIZATION     CARDIAC CATHETERIZATION N/A 12/16/2015   Procedure: Left Heart Cath and Coronary Angiography;  Surgeon: Millicent Ally, MD;  [facility]: [facility] INVAS[facility] CV LAB;  Service: Cardiovascular;  Laterality: N/A;   CARDIOVERSION N/A 10/04/2017   Procedure: CARDIOVERSION;  Surgeon: Flavia Hughs, MD;  Location: [facility] ORS;  Service: Cardiovascular;  Laterality: N/A;   CARDIOVERSION N/A 01/30/2018   Procedure: CARDIOVERSION;  Surgeon: Flavia Hughs, MD;  [facility];  Service: Cardiovascular;  Laterality: N/A;   CARDIOVERSION N/A 11/10/2019   Procedure: CARDIOVERSION;  Surgeon: Loyde Rule, MD;  Location: [facility] ENDOSCOPY;  Service: Cardiovascular;  Laterality: N/A;   CARPAL TUNNEL RELEASE  1989   left   cataract surgery     CHOLECYSTECTOMY  1998   Cholecystectomy     COLONOSCOPY  03/16/2011   multiple hyperplastic colon polyps, sigmoid  diverticulosis, melanosis coli   COLONOSCOPY WITH PROPOFOL  N/A 07/05/2015   RMR:one 5 mm polyp in descending colon   COLONOSCOPY WITH PROPOFOL  N/A 06/28/2020   Procedure: COLONOSCOPY WITH PROPOFOL ;  Surgeon: Suzette Espy, MD;  [facility];  Service: Endoscopy;  Laterality: N/A;  am appt   CORONARY ANGIOGRAPHY N/A 05/16/2018   Procedure: CORONARY ANGIOGRAPHY (CATH LAB);  Surgeon: Arty Binning, MD;  Location: [facility] INVASIVE CV LAB;  Service: Cardiovascular;  Laterality: N/A;   CORONARY ANGIOPLASTY WITH STENT PLACEMENT  2015   ESOPHAGEAL DILATION N/A 07/05/2015   Procedure: ESOPHAGEAL DILATION;  Surgeon: Suzette Espy, MD;  [facility];  Service: Endoscopy;  Laterality: N/A;   ESOPHAGOGASTRODUODENOSCOPY (EGD) WITH PROPOFOL  N/A 07/05/2015   MVH:QIONGE   ESOPHAGOGASTRODUODENOSCOPY (EGD) WITH PROPOFOL  N/A 02/16/2020   Procedure: ESOPHAGOGASTRODUODENOSCOPY (EGD) WITH PROPOFOL ;  Surgeon: Suzette Espy, MD;  Normal examined esophagus s/p dilation, erythematous mucosa in the stomach s/p biopsy, normal examined duodenum. Pathology with mild gastropathy, negative for H. Pylori.    GIVENS CAPSULE STUDY N/A 11/10/2020   Procedure: GIVENS CAPSULE STUDY;  Surgeon: Suzette Espy, MD;  [facility];  Service: Endoscopy;  Laterality: N/A;  7:30am   JOINT REPLACEMENT Right 2007   right knee   left hand surgery     LEFT HEART CATHETERIZATION WITH CORONARY ANGIOGRAM N/A 05/14/2013   Procedure: LEFT HEART CATHETERIZATION WITH CORONARY ANGIOGRAM;  Surgeon: Arlander Bellman, MD;  Location: [facility] CATH LAB;  Service: Cardiovascular;  Laterality: N/A;   left rotator cuff surgery     LUMBAR LAMINECTOMY/DECOMPRESSION MICRODISCECTOMY N/A 10/11/2012   Procedure: LUMBAR LAMINECTOMY/DECOMPRESSION MICRODISCECTOMY 2 LEVELS;  Surgeon: Adelbert Adler, MD;  [facility] NEURO [facility];  Service: Neurosurgery;  Laterality: N/A;  L3-4 L4-5 Laminectomy   LUMBAR WOUND DEBRIDEMENT N/A 09/27/2015   Procedure:  Exploration of Lumbar Wound [name]/ Repair CSF Leak/Lumbar Drain Placement;  Surgeon: Claudetta Cuba, MD;  Location: [facility] NEURO ORS;  Service: Neurosurgery;  Laterality: N/A;   MALONEY DILATION  03/16/2011   Gastritis. No H.pylori on bx. 48F maloney dilation with disruption of  occult cevical esophageal web   MALONEY DILATION N/A 02/16/2020   Procedure: MALONEY DILATION;  Surgeon: Suzette Espy, MD;  [facility];  Service: Endoscopy;  Laterality: N/A;   NASAL SINUS SURGERY     right knee replacement  2007   right leg benign tumor  SHOULDER SURGERY Left    TEE WITHOUT CARDIOVERSION N/A 10/04/2017   Procedure: TRANSESOPHAGEAL ECHOCARDIOGRAM (TEE) WITH PROPOFOL ;  Surgeon: Flavia Hughs, MD;  Location: [facility];  Service: Cardiovascular;  Laterality: N/A;   TEE WITHOUT CARDIOVERSION N/A 11/10/2019   Procedure: TRANSESOPHAGEAL ECHOCARDIOGRAM (TEE);  Surgeon: Loyde Rule, MD;  Location: [facility] ENDOSCOPY;  Service: Cardiovascular;  Laterality: N/A;   TONSILLECTOMY     unspecified area, hysterectomy  1972   partial    SOCIAL HISTORY:  Social History   Socioeconomic History   Marital status: Divorced    Spouse name: Not on file   Number of children: 5   Years of education: Not on file   Highest education level: Not on file  Occupational History   Occupation: retired  Tobacco Use   Smoking status: Former    Current packs/day: 0.00    Average packs/day: 1 pack/day for 64.0 years (64.0 ttl pk-yrs)    Types: Cigarettes    Start date: 12/24/1947    Quit date: 11/17/2001    Years since quitting: 21.8   Smokeless tobacco: Never   Tobacco comments:    Quit smoking in 2003  Vaping Use   Vaping status: Never Used  Substance and Sexual Activity   Alcohol  use: Not Currently   Drug use: No   Sexual activity: Never  Other Topics Concern   Not on file  Social History Narrative   Divorced.   Sister had colon perforation & died from complications in [place], [place]   Social Drivers of  Health   Financial Resource Strain: Low Risk  (04/08/2023)   Received from [facility]   Overall Financial Resource Strain (CARDIA)    Difficulty of Paying Living Expenses: Not hard at all  Food Insecurity: No Food Insecurity (04/08/2023)   Received from [facility]   Hunger Vital Sign    Worried About Running Out of Food in the Last Year: Never true    Ran Out of Food in the Last Year: Never true  Transportation Needs: No Transportation Needs (04/08/2023)   Received from [facility] - Transportation    Lack of Transportation (Medical): No    Lack of Transportation (Non-Medical): No  Physical Activity: Inactive (04/08/2023)   Received from [facility]   Exercise Vital Sign    Days of Exercise per Week: 0 days    Minutes of Exercise per Session: 0 min  Stress: No Stress Concern Present (04/08/2023)   Received from [facility] - Occupational Stress Questionnaire    Feeling of Stress : Only a little  Social Connections: Socially Isolated (04/08/2023)   Received from [facility]   Social Connection and Isolation Panel [NHANES]    Frequency of Communication with Friends and Family: More than three times a week    Frequency of Social Gatherings with Friends and Family: Never    Attends Religious Services: Never    Database administrator or Organizations: No    Attends Banker Meetings: Never    Marital Status: Widowed  Intimate Partner Violence: Not At Risk (04/08/2023)   Received from [facility]   Humiliation, Afraid, Rape, and Kick questionnaire    Fear of Current or Ex-Partner: No    Emotionally Abused: No    Physically Abused: No    Sexually Abused: No    FAMILY HISTORY:  Family History  Problem Relation Age of Onset  Stroke Mother    Parkinson's disease Father    Coronary artery disease Other        family Hx-sons   Cancer Other    Stroke Other        family Hx    Hypertension Other        family Hx   Diabetes Brother    Heart disease Son        before age 29   Diabetes Son    Stroke Daughter 91   Colon cancer Grandson        diagnosed 86   Inflammatory bowel disease Neg Hx     CURRENT MEDICATIONS:  Outpatient Encounter Medications as of 09/04/2023  Medication Sig   acetaminophen  (TYLENOL ) 500 MG tablet Take 500 mg by mouth every 6 (six) hours as needed for headache or moderate pain.   allopurinol  (ZYLOPRIM ) 100 MG tablet Take 100 mg by mouth.   ALPRAZolam  (XANAX ) 0.5 MG tablet Take 0.5 mg by mouth daily as needed for anxiety or sleep.   apixaban  (ELIQUIS ) 5 MG TABS tablet Take 1 tablet (5 mg total) by mouth 2 (two) times daily.   ascorbic acid  (VITAMIN C) 500 MG tablet Take 1 tablet (500 mg total) by mouth daily.   cyclobenzaprine  (FLEXERIL ) 5 MG tablet Take 5 mg by mouth at bedtime.   diltiazem  (CARDIZEM  CD) 180 MG 24 hr capsule Take 1 capsule (180 mg total) by mouth daily before breakfast.   diltiazem  (TIAZAC ) 120 MG 24 hr capsule Take 1 capsule (120 mg total) by mouth at bedtime.   DULoxetine  (CYMBALTA ) 60 MG capsule Take 60 mg by mouth daily.   estradiol  (ESTRACE ) 0.1 MG/GM vaginal cream Discard plastic applicator. Insert a blueberry size amount (approximately 1 gram) of cream on fingertip inside vagina at bedtime every night for 1 week then 2 nights per week for long term use.   fluticasone  (CUTIVATE ) 0.05 % cream Apply 1 application topically 2 (two) times daily as needed (skin tears).   furosemide  (LASIX ) 40 MG tablet Take 1 tablet (40 mg total) by mouth daily.   gentamicin (GARAMYCIN) 0.3 % ophthalmic solution 1 drop 2 (two) times daily.   HYDROcodone -acetaminophen  (NORCO/VICODIN) 5-325 MG tablet Take 1 tablet by mouth every 6 (six) hours as needed.   hydrocortisone  (ANUSOL -HC) 2.5 % rectal cream Place 1 application rectally 2 (two) times daily.   isosorbide  mononitrate (IMDUR ) 30 MG 24 hr tablet Take 1 tablet (30 mg total) by mouth  daily.   magnesium  oxide (MAG-OX) 400 (240 Mg) MG tablet Take 1 tablet by mouth daily.   Multiple Vitamins-Minerals (HAIR/SKIN/NAILS) CAPS Take 1 tablet by mouth daily.   Naphazoline HCl (CLEAR EYES OP) Place 1 drop into both eyes daily as needed (itching).   nitroGLYCERIN  (NITROSTAT ) 0.4 MG SL tablet DISSOLVE ONE TABLET UNDER TONGUE EVERY 5 MINUTES UP TO 3 DOSES AS NEEDED FOR CHEST PAIN.   ondansetron  (ZOFRAN -ODT) 4 MG disintegrating tablet Take 4 mg by mouth every 8 (eight) hours as needed.   pantoprazole  (PROTONIX ) 40 MG tablet TAKE ONE TABLET BY MOUTH TWICE DAILY BEFORE MEALS   potassium chloride  (KLOR-CON  M) 10 MEQ tablet Take 1 tablet by mouth daily.   potassium chloride  (KLOR-CON ) 10 MEQ tablet Take 2 tablets (20 mEq total) by mouth 2 (two) times daily.   traMADol  (ULTRAM ) 50 MG tablet Take 1 tablet (50 mg total) by mouth every 6 (six) hours as needed.   TRELEGY ELLIPTA 100-62.5-25 MCG/ACT AEPB Take 1 puff by mouth daily.  triamcinolone cream (KENALOG) 0.1 % Apply topically 2 (two) times daily.   zinc  sulfate 220 (50 Zn) MG capsule Take 1 capsule (220 mg total) by mouth daily.   [DISCONTINUED] cyanocobalamin  (VITAMIN B12) 1000 MCG tablet Take 1 tablet (1,000 mcg total) by mouth daily.   cyanocobalamin  (VITAMIN B12) 1000 MCG tablet Take 1 tablet (1,000 mcg total) by mouth every other day.   iron polysaccharides (NIFEREX) 150 MG capsule Take 150 mg by mouth.   metoprolol  succinate (TOPROL -XL) 50 MG 24 hr tablet Take 1 tablet (50 mg total) by mouth 2 (two) times daily. Take with or immediately following a meal.   No facility-administered encounter medications on file as of 09/04/2023.    ALLERGIES:  Allergies  Allergen Reactions   Cephalosporins Diarrhea and Nausea Only    Lightheaded   Levaquin [Levofloxacin In D5w] Swelling   Macrodantin [Nitrofurantoin Macrocrystal] Swelling   Phenothiazines Anaphylaxis and Hives   Polysorbate Anaphylaxis   Prednisone  Shortness Of Breath    Buspirone Itching   Cardura [Doxazosin Mesylate] Itching   Codeine Itching   Acyclovir And Related Itching    Redness of skin   Colcrys  [Colchicine ] Nausea Only    Upset stomach    Prochlorperazine Other (See Comments)    "Upset stomach"   Ranexa  [Ranolazine ]     Severe drop in BP   Sulfa  Antibiotics Other (See Comments)    Severe blisters   Atorvastatin Hives    Cramping; tolerates Crestor  ok   Colestipol  Palpitations   Ofloxacin Rash   Other Itching and Rash    "WOOL"= make skin look like it has been burned   Penicillins Other (See Comments)    Causes redness all over. Has patient had a PCN reaction causing immediate rash, facial/tongue/throat swelling, SOB or lightheadedness with hypotension: No Has patient had a PCN reaction causing severe rash involving mucus membranes or skin necrosis: No Has patient had a PCN reaction that required hospitalization No Has patient had a PCN reaction occurring within the last 10 years: No If all of the above answers are "NO", then may proceed with Cephalosporin use.    Pimozide Hives and Itching   Zetia  [Ezetimibe ] Itching and Rash    LABORATORY DATA:  I have reviewed the labs as listed.  CBC    Component Value Date/Time   WBC 7.6 08/21/2023 1402   RBC 4.49 08/21/2023 1402   HGB 13.7 08/21/2023 1402   HCT 40.1 08/21/2023 1402   PLT 358 08/21/2023 1402   MCV 89.3 08/21/2023 1402   MCH 30.5 08/21/2023 1402   MCHC 34.2 08/21/2023 1402   RDW 13.4 08/21/2023 1402   LYMPHSABS 3.1 08/21/2023 1402   MONOABS 0.5 08/21/2023 1402   EOSABS 0.2 08/21/2023 1402   BASOSABS 0.1 08/21/2023 1402      Latest Ref Rng & Units 08/21/2023    2:02 PM 03/30/2023   10:12 AM 03/01/2023   11:59 AM  CMP  Glucose 70 - 99 mg/dL 409  811    BUN 8 - 23 mg/dL 26  18    Creatinine 9.14 - 1.00 mg/dL 7.82  9.56  2.13   Sodium 135 - 145 mmol/L 134  136    Potassium 3.5 - 5.1 mmol/L 3.6  3.8    Chloride 98 - 111 mmol/L 97  99    CO2 22 - 32 mmol/L 24  25     Calcium  8.9 - 10.3 mg/dL 9.0  9.3      DIAGNOSTIC IMAGING:  I  have independently reviewed the relevant imaging and discussed with the patient.   WRAP UP:  All questions were answered. The patient knows to call the clinic with any problems, questions or concerns.  Medical decision making: Moderate  Time spent on visit: I spent 20 minutes counseling the patient face to face. The total time spent in the appointment was 30 minutes and more than 50% was on counseling.  Sonnie Dusky, PA-C  09/04/23 2:43 PM

## 2023-09-04 ENCOUNTER — Inpatient Hospital Stay: Admitting: Physician Assistant

## 2023-09-04 VITALS — BP 131/67 | HR 62 | Temp 96.7°F | Resp 20 | Wt 153.7 lb

## 2023-09-04 DIAGNOSIS — D500 Iron deficiency anemia secondary to blood loss (chronic): Secondary | ICD-10-CM

## 2023-09-04 DIAGNOSIS — E538 Deficiency of other specified B group vitamins: Secondary | ICD-10-CM | POA: Diagnosis not present

## 2023-09-04 MED ORDER — VITAMIN B-12 1000 MCG PO TABS: 1000.0000 ug | ORAL_TABLET | ORAL | 1 refills | Status: AC

## 2023-09-04 NOTE — Patient Instructions (Addendum)
 [facility] at [facility] Discharge Instructions  You were seen today by Sheril Dines PA-C for your iron deficiency anemia.  Your blood and iron levels look great!  You do not need any IV iron at this time.  Your vitamin B12 levels look much better.  You can DECREASE your vitamin B12 (cyanocobalamin ) 1000 mcg to take it every other day.    We will check your labs and see you again in 6 months to see if you need more IV iron at that time.  FOLLOW-UP APPOINTMENT: Office visit in 6 months, after labs  ** Thank you for trusting me with your healthcare!  I strive to provide all of my patients with quality care at each visit.  If you receive a survey for this visit, I would be so grateful to you for taking the time to provide feedback.  Thank you in advance!  ~ Rahm [employer]                   Dr. Paulett Boros   &   Sheril Dines, PA-C   - - - - - - - - - - - - - - - - - -     Thank you for choosing [facility] at [facility] to provide your oncology and hematology care.  To afford each patient quality time with our provider, please arrive at least 15 minutes before your scheduled appointment time.   If you have a lab appointment with the Cancer Center please come in thru the Main Entrance and check in at the main information desk.  You need to re-schedule your appointment should you arrive 10 or more minutes late.  We strive to give you quality time with our providers, and arriving late affects you and other patients whose appointments are after yours.  Also, if you no show three or more times for appointments you may be dismissed from the clinic at the providers discretion.     Again, thank you for choosing [facility].  Our hope is that these requests will decrease the amount of time that you wait before being seen by our physicians.       _____________________________________________________________  Should you have  questions after your visit to [facility], please contact our office at [phone] and follow the prompts.  Our office hours are 8:00 a.m. and 4:30 p.m. Monday - Friday.  Please note that voicemails left after 4:00 p.m. may not be returned until the following business day.  We are closed weekends and major holidays.  You do have access to a nurse 24-7, just call the main number to the clinic [phone] and do not press any options, hold on the line and a nurse will answer the phone.    For prescription refill requests, have your pharmacy contact our office and allow 72 hours.    Due to Covid, you will need to wear a mask upon entering the hospital. If you do not have a mask, a mask will be given to you at the Main Entrance upon arrival. For doctor visits, patients may have 1 support person age 52 or older with them. For treatment visits, patients can not have anyone with them due to social distancing guidelines and our immunocompromised population.

## 2023-09-06 DIAGNOSIS — R0602 Shortness of breath: Secondary | ICD-10-CM | POA: Diagnosis not present

## 2023-09-06 DIAGNOSIS — J440 Chronic obstructive pulmonary disease with acute lower respiratory infection: Secondary | ICD-10-CM | POA: Diagnosis not present

## 2023-09-06 DIAGNOSIS — M109 Gout, unspecified: Secondary | ICD-10-CM | POA: Diagnosis not present

## 2023-09-06 DIAGNOSIS — K219 Gastro-esophageal reflux disease without esophagitis: Secondary | ICD-10-CM | POA: Diagnosis not present

## 2023-09-06 DIAGNOSIS — R5383 Other fatigue: Secondary | ICD-10-CM | POA: Diagnosis not present

## 2023-09-06 DIAGNOSIS — R945 Abnormal results of liver function studies: Secondary | ICD-10-CM | POA: Diagnosis not present

## 2023-09-06 DIAGNOSIS — R7989 Other specified abnormal findings of blood chemistry: Secondary | ICD-10-CM | POA: Diagnosis not present

## 2023-09-06 DIAGNOSIS — I10 Essential (primary) hypertension: Secondary | ICD-10-CM | POA: Diagnosis not present

## 2023-09-06 DIAGNOSIS — J449 Chronic obstructive pulmonary disease, unspecified: Secondary | ICD-10-CM | POA: Diagnosis not present

## 2023-09-06 DIAGNOSIS — R0781 Pleurodynia: Secondary | ICD-10-CM | POA: Diagnosis not present

## 2023-09-06 DIAGNOSIS — A0472 Enterocolitis due to Clostridium difficile, not specified as recurrent: Secondary | ICD-10-CM | POA: Diagnosis not present

## 2023-09-06 DIAGNOSIS — R300 Dysuria: Secondary | ICD-10-CM | POA: Diagnosis not present

## 2023-09-06 DIAGNOSIS — N183 Chronic kidney disease, stage 3 unspecified: Secondary | ICD-10-CM | POA: Diagnosis not present

## 2023-09-06 DIAGNOSIS — E1165 Type 2 diabetes mellitus with hyperglycemia: Secondary | ICD-10-CM | POA: Diagnosis not present

## 2023-09-13 DIAGNOSIS — I739 Peripheral vascular disease, unspecified: Secondary | ICD-10-CM | POA: Diagnosis not present

## 2023-09-13 DIAGNOSIS — M109 Gout, unspecified: Secondary | ICD-10-CM | POA: Diagnosis not present

## 2023-09-13 DIAGNOSIS — N183 Chronic kidney disease, stage 3 unspecified: Secondary | ICD-10-CM | POA: Diagnosis not present

## 2023-09-13 DIAGNOSIS — M545 Low back pain, unspecified: Secondary | ICD-10-CM | POA: Diagnosis not present

## 2023-09-13 DIAGNOSIS — Z6828 Body mass index (BMI) 28.0-28.9, adult: Secondary | ICD-10-CM | POA: Diagnosis not present

## 2023-09-14 DIAGNOSIS — M109 Gout, unspecified: Secondary | ICD-10-CM | POA: Diagnosis not present

## 2023-09-14 DIAGNOSIS — I2510 Atherosclerotic heart disease of native coronary artery without angina pectoris: Secondary | ICD-10-CM | POA: Diagnosis not present

## 2023-10-15 DIAGNOSIS — M109 Gout, unspecified: Secondary | ICD-10-CM | POA: Diagnosis not present

## 2023-10-15 DIAGNOSIS — I2510 Atherosclerotic heart disease of native coronary artery without angina pectoris: Secondary | ICD-10-CM | POA: Diagnosis not present

## 2023-11-14 DIAGNOSIS — I739 Peripheral vascular disease, unspecified: Secondary | ICD-10-CM | POA: Diagnosis not present

## 2023-11-14 DIAGNOSIS — M109 Gout, unspecified: Secondary | ICD-10-CM | POA: Diagnosis not present

## 2023-11-14 DIAGNOSIS — N183 Chronic kidney disease, stage 3 unspecified: Secondary | ICD-10-CM | POA: Diagnosis not present

## 2023-11-14 DIAGNOSIS — R051 Acute cough: Secondary | ICD-10-CM | POA: Diagnosis not present

## 2023-11-14 DIAGNOSIS — Z20828 Contact with and (suspected) exposure to other viral communicable diseases: Secondary | ICD-10-CM | POA: Diagnosis not present

## 2023-11-14 DIAGNOSIS — M545 Low back pain, unspecified: Secondary | ICD-10-CM | POA: Diagnosis not present

## 2023-11-14 DIAGNOSIS — Z6828 Body mass index (BMI) 28.0-28.9, adult: Secondary | ICD-10-CM | POA: Diagnosis not present

## 2023-11-15 DIAGNOSIS — M109 Gout, unspecified: Secondary | ICD-10-CM | POA: Diagnosis not present

## 2023-11-15 DIAGNOSIS — I2510 Atherosclerotic heart disease of native coronary artery without angina pectoris: Secondary | ICD-10-CM | POA: Diagnosis not present

## 2023-11-22 DIAGNOSIS — I482 Chronic atrial fibrillation, unspecified: Secondary | ICD-10-CM | POA: Diagnosis not present

## 2023-11-22 DIAGNOSIS — I739 Peripheral vascular disease, unspecified: Secondary | ICD-10-CM | POA: Diagnosis not present

## 2023-11-22 DIAGNOSIS — Z6828 Body mass index (BMI) 28.0-28.9, adult: Secondary | ICD-10-CM | POA: Diagnosis not present

## 2023-11-22 DIAGNOSIS — N183 Chronic kidney disease, stage 3 unspecified: Secondary | ICD-10-CM | POA: Diagnosis not present

## 2023-11-22 DIAGNOSIS — M545 Low back pain, unspecified: Secondary | ICD-10-CM | POA: Diagnosis not present

## 2023-11-22 DIAGNOSIS — M109 Gout, unspecified: Secondary | ICD-10-CM | POA: Diagnosis not present

## 2023-11-22 DIAGNOSIS — R739 Hyperglycemia, unspecified: Secondary | ICD-10-CM | POA: Diagnosis not present

## 2023-11-28 ENCOUNTER — Other Ambulatory Visit: Payer: Self-pay | Admitting: *Deleted

## 2023-12-03 DIAGNOSIS — I10 Essential (primary) hypertension: Secondary | ICD-10-CM | POA: Diagnosis not present

## 2023-12-03 DIAGNOSIS — N183 Chronic kidney disease, stage 3 unspecified: Secondary | ICD-10-CM | POA: Diagnosis not present

## 2023-12-14 DIAGNOSIS — I2510 Atherosclerotic heart disease of native coronary artery without angina pectoris: Secondary | ICD-10-CM | POA: Diagnosis not present

## 2023-12-14 DIAGNOSIS — M109 Gout, unspecified: Secondary | ICD-10-CM | POA: Diagnosis not present

## 2023-12-15 DIAGNOSIS — I509 Heart failure, unspecified: Secondary | ICD-10-CM | POA: Diagnosis not present

## 2023-12-15 DIAGNOSIS — R918 Other nonspecific abnormal finding of lung field: Secondary | ICD-10-CM | POA: Diagnosis not present

## 2023-12-15 DIAGNOSIS — G9340 Encephalopathy, unspecified: Secondary | ICD-10-CM | POA: Diagnosis not present

## 2023-12-15 DIAGNOSIS — Z79899 Other long term (current) drug therapy: Secondary | ICD-10-CM | POA: Diagnosis not present

## 2023-12-15 DIAGNOSIS — R197 Diarrhea, unspecified: Secondary | ICD-10-CM | POA: Diagnosis not present

## 2023-12-15 DIAGNOSIS — Z881 Allergy status to other antibiotic agents status: Secondary | ICD-10-CM | POA: Diagnosis not present

## 2023-12-15 DIAGNOSIS — I5033 Acute on chronic diastolic (congestive) heart failure: Secondary | ICD-10-CM | POA: Diagnosis not present

## 2023-12-15 DIAGNOSIS — K219 Gastro-esophageal reflux disease without esophagitis: Secondary | ICD-10-CM | POA: Diagnosis not present

## 2023-12-15 DIAGNOSIS — Z66 Do not resuscitate: Secondary | ICD-10-CM | POA: Diagnosis not present

## 2023-12-15 DIAGNOSIS — N189 Chronic kidney disease, unspecified: Secondary | ICD-10-CM | POA: Diagnosis not present

## 2023-12-15 DIAGNOSIS — Z885 Allergy status to narcotic agent status: Secondary | ICD-10-CM | POA: Diagnosis not present

## 2023-12-15 DIAGNOSIS — R410 Disorientation, unspecified: Secondary | ICD-10-CM | POA: Diagnosis not present

## 2023-12-15 DIAGNOSIS — I130 Hypertensive heart and chronic kidney disease with heart failure and stage 1 through stage 4 chronic kidney disease, or unspecified chronic kidney disease: Secondary | ICD-10-CM | POA: Diagnosis not present

## 2023-12-15 DIAGNOSIS — I4891 Unspecified atrial fibrillation: Secondary | ICD-10-CM | POA: Diagnosis not present

## 2023-12-15 DIAGNOSIS — R404 Transient alteration of awareness: Secondary | ICD-10-CM | POA: Diagnosis not present

## 2023-12-15 DIAGNOSIS — F05 Delirium due to known physiological condition: Secondary | ICD-10-CM | POA: Diagnosis not present

## 2023-12-15 DIAGNOSIS — N179 Acute kidney failure, unspecified: Secondary | ICD-10-CM | POA: Diagnosis not present

## 2023-12-15 DIAGNOSIS — Z882 Allergy status to sulfonamides status: Secondary | ICD-10-CM | POA: Diagnosis not present

## 2023-12-15 DIAGNOSIS — Z87891 Personal history of nicotine dependence: Secondary | ICD-10-CM | POA: Diagnosis not present

## 2023-12-15 DIAGNOSIS — I110 Hypertensive heart disease with heart failure: Secondary | ICD-10-CM | POA: Diagnosis not present

## 2023-12-15 DIAGNOSIS — Z7901 Long term (current) use of anticoagulants: Secondary | ICD-10-CM | POA: Diagnosis not present

## 2023-12-15 DIAGNOSIS — Z90710 Acquired absence of both cervix and uterus: Secondary | ICD-10-CM | POA: Diagnosis not present

## 2023-12-15 DIAGNOSIS — J849 Interstitial pulmonary disease, unspecified: Secondary | ICD-10-CM | POA: Diagnosis not present

## 2023-12-15 DIAGNOSIS — E785 Hyperlipidemia, unspecified: Secondary | ICD-10-CM | POA: Diagnosis not present

## 2023-12-15 DIAGNOSIS — E876 Hypokalemia: Secondary | ICD-10-CM | POA: Diagnosis not present

## 2023-12-15 DIAGNOSIS — R079 Chest pain, unspecified: Secondary | ICD-10-CM | POA: Diagnosis not present

## 2023-12-15 DIAGNOSIS — R531 Weakness: Secondary | ICD-10-CM | POA: Diagnosis not present

## 2023-12-15 DIAGNOSIS — E039 Hypothyroidism, unspecified: Secondary | ICD-10-CM | POA: Diagnosis not present

## 2023-12-15 DIAGNOSIS — J449 Chronic obstructive pulmonary disease, unspecified: Secondary | ICD-10-CM | POA: Diagnosis not present

## 2023-12-15 DIAGNOSIS — R4182 Altered mental status, unspecified: Secondary | ICD-10-CM | POA: Diagnosis not present

## 2023-12-15 DIAGNOSIS — R5381 Other malaise: Secondary | ICD-10-CM | POA: Diagnosis not present

## 2023-12-15 DIAGNOSIS — E860 Dehydration: Secondary | ICD-10-CM | POA: Diagnosis not present

## 2023-12-15 DIAGNOSIS — R0602 Shortness of breath: Secondary | ICD-10-CM | POA: Diagnosis not present

## 2023-12-18 ENCOUNTER — Telehealth: Payer: Self-pay

## 2023-12-18 NOTE — Discharge Summary (Signed)
 ------------------------------------------------------------------------------- Attestation signed by Feliz Margart Fallow, DO at 12/18/23 0845 I was the supervising physician during time of service.  Case was discussed with APP and I agree with management as outlined below. -------------------------------------------------------------------------------   DISCHARGE SUMMARY [facility]   Discharge date:   December 18, 2023 Length of stay:    LOS: 2 days    Discharge Service:   [facility] Hospitalists Discharge Attending Physician: No att. providers found Discharge to:    To Home with Home Health Condition at Discharge:  stable Code status:                         Full Code  ______________________________________  Admission HPI    Patient admitted on: 12/15/2023  7:05 PM  Patient admitted by: Jerilynn Gordy Given, MD    CHIEF COMPLAINT:  Persistent diarrhea. Dyspnea on exertion. Confusion.   Day of admission HPI:  [name]  is a 89 y.o. female with a PMH significant for HTN, CHF, AF, COPD, CKD, hypothyroidism, ILD, IBS, OSA and multiple medical problems who presented with 2-3 week history of diarrhea as well as progressive dyspnea on exertion.  She tells me diarrhea is been ongoing for the past several weeks and thinks it may be attributable to her gout medication.  Denies sick contacts or recent travel.  She does have occasional blood in her stools and does have hemorrhoids.  She is anticoagulated.  Denies fever or chills.  Again notes progressive dyspnea on exertion with occasional cough.  She is not oxygen  dependent.  Denies abdominal pain but does endorse nausea without vomiting.  Denies any GU symptoms.  According to family at bedside she has been increasingly confused today.  This is not typical for her.  No falls reported.   Patient admitted on Home O2? - no Patient on home anticoagulant? -  no Patient admitted with Chronic home foley catheter? - no Foley  catheter placed or replaced by another service prior to admission? - no Central Line Status: NONE   Mental Status on Admission: The patient  Alert and oriented to PERSON The patient  is Alert And oriented to TIME The patient SOMEWHAT Alert and oriented to LOCATION   Today; patient is sitting up in recliner chair awake and alert.  She is feeling much better, feels like she is ready to go home.  Going to stay with her daughter [name] for a while.  Open to home health.    Problem List, Assessment & Plan     ASSESSMENT & PLAN (In order of descending acuity)   Acute on Chronic HFpEF (diastolic heart failure), shortness of breath Last echo 12/24 EF > 55% Used IV diuresis twice daily, to resume home lasix  PO Strict intake and output 2 L fluid restriction Continue Imdur , metoprolol  Follows with [facility] cardiology outpatient No oxygen  need   Persistent diarrhea, resolved Patient had large bowel movement this morning, loose but not liquidy Electrolytes repleted, received gentle IV fluids Taking p.o. much better   Hypokalemia Potassium 2.7 on admission, repleted orally 3.4 today Sees hematologist in the next week   Atrial fibrillation Heart rate up to 110 but has settled down into the 80s Continue metoprolol , diltiazem , Eliquis    Hypothyroidism TSH normal Patient is not on medication at home currently   Metabolic encephalopathy, resolved Patient was increasingly confused at home per family but she is back to baseline   Hypertension Blood pressure has been stable on her home medication  regimen of metoprolol , Imdur , diltiazem    Anticoagulation; Eliquis  Oxygen ; none IV fluids; none IV antibiotics; none IV diuresis; Lasix  twice daily CODE STATUS; DNR/DNI  Pt is medically stable and ready for discharge.  She will go home with [name] her daughter and will have home health.  Has follow up appts with PCP and specialists.  Discussed pt's discharge plan with she and her daughter at the  bedside.  Discussed with Dr. Feliz attending.  Discharge home.      ADDITIONAL NON-ACUTE FINDINGS, OBSERVATIONS, FAMILY DISCUSSIONS, ETC. (When present):   General; ill appearing 89 year old female, no acute distress Cardiovascular; regular rhythm, rate 80s Pulmonary; crackles, mild shortness of breath, saturating well on RA Abdomen; soft, nontender, bowel sounds present all 4 quadrants Extremities; moves all extremities spontaneously, no edema Neuro; alert, oriented X 3, no focal weakness   Incidental Findings for outpatient Follow-Up:  none   DVT Prophylaxis Ordered: on other anticoagulant (Eliquis ) ______________________________________  Timeline of Significant Events: No notes on file   Mental Status On day of Discharge:  The patient is Alert and oriented to PERSON The patient is Alert And oriented to TIME The patient is Alert and oriented to LOCATION  CODE STATUS :                    DNR/DNI  An advanced care planning discussion was  had with patient and/or patient's decisions maker (documented separately).  Patient discharged on Home O2? - no Patient discharged on home anticoagulant? -  yes  Foley Catheter status: None Central Line Status: NONE  Time Spent on Discharge I spent greater than 30 minutes counseling and coordinating care for the discharge of this patient. The pt and her daughter [name] and I discussed the importance of outpatient follow-up as well as concerning signs and symptoms that would require immediate evaluation by a medical professional. The aforementioned conversation participants understand  and did show insight. I did use teachback to ensure understanding. The above participant/s is aware that not following the discussed plan, recommendations, and follow up can lead to severe negative effects on the patient's health, up to and including death.  Discharge Medications     Your Medication List     CONTINUE taking these medications     dilTIAZem  180 MG 24 hr capsule Commonly known as: TIAZAC  Take 120 mg by mouth in the morning.   DULoxetine  60 MG capsule Commonly known as: CYMBALTA    ELIQUIS  5 mg Tab Generic drug: apixaban  Take by mouth two (2) times a day.   furosemide  40 MG tablet Commonly known as: LASIX  Take 1 tablet (40 mg total) by mouth in the morning.   isosorbide  mononitrate 60 MG 24 hr tablet Commonly known as: IMDUR  Take 1 tablet (60 mg total) by mouth in the morning.   magnesium  oxide 400 mg (241.3 mg elemental) tablet Commonly known as: MAG-OX Take 1 tablet by mouth in the morning.   metoPROLOL  succinate 25 MG 24 hr tablet Commonly known as: Toprol -XL   nitroglycerin  0.4 MG SL tablet Commonly known as: NITROSTAT  Place 1 tablet (0.4 mg total) under the tongue.   pantoprazole  40 MG tablet Commonly known as: Protonix        _____________________________________  Nutrition:                                  Diet Instructions     Discharge diet (specify)  Discharge Nutrition Therapy: Heart Healthy                     ___________________________________________  Discharge Instructions   Nutrition:                                  Diet Instructions     Discharge diet (specify)     Discharge Nutrition Therapy: Heart Healthy       Activity:                                   Activity Instructions     Activity as tolerated         Appointments:                          Follow Up:                              Follow Up instructions and Outpatient Referrals    Ambulatory Referral to Home Health     Reason for referral: [facility]   Requested follow up plan: You would evaluate and manage.   Disciplines requested:  Nursing Physical Therapy     Nursing requested: Teaching/skilled observation and assessment   What teaching is needed (new diagnosis? new medications?): dehydration  control   Physical Therapy requested: Evaluate and treat   Physician to follow patient's  care: PCP Comment - Hasanaj   Requested start of care date: Routine (within 48 hours)   Call MD for:  persistent nausea or vomiting     Call MD for:  severe uncontrolled pain     Call MD for: Temperature > 38.5 Celsius ( > 101.3 Fahrenheit)     Discharge instructions         Allergies  Allergen Reactions  . Cephalosporins Diarrhea and Nausea Only    Lightheaded Lightheaded   . Levofloxacin In D5w Swelling  . Nitrofurantoin Macrocrystal Swelling  . Phenothiazines Anaphylaxis, Hives and Other (See Comments)    unknown Upset stomach   . Pimozide Hives and Itching  . Polysorbate Anaphylaxis  . Prednisone  Itching and Shortness Of Breath  . Statins-Hmg-Coa Reductase Inhibitors Hives    vomiting Cramping; tolerates Crestor  ok   . Buspirone Itching  . Codeine Itching  . Doxazosin Mesylate Itching  . Polysorbates   . Ranolazine  Other (See Comments)    Severe drop in BP Severe drop in BP   . Sulfa  (Sulfonamide Antibiotics) Other (See Comments)    Severe blisters  . Acyclovir Itching    Redness of skin  . Acyclovir (Bulk) Itching    Redness of skin  . Colchicine  Nausea Only  . Colestipol  Palpitations  . Ezetimibe  Itching and Rash  . Ofloxacin Rash  . Other Itching and Rash    WOOL= make skin look like it has been burned WOOL= make skin look like it has been burned   . Penicillins Other (See Comments)    unknown Causes redness all over. Has patient had a PCN reaction causing immediate rash, facial/tongue/throat swelling, SOB or lightheadedness with hypotension: No Has patient had a PCN reaction causing severe rash involving mucus membranes or skin necrosis: No Has patient had a PCN reaction that required hospitalization No Has patient had a PCN reaction occurring  within the last 10 years: No If all of the above answers are NO, then may proceed with Cephalosporin use.      Past Medical History[1]  Past Surgical History[2]   Family History[3]   Current  Medications[4]  Imaging  CT Head Wo Contrast Result Date: 12/15/2023 Exam: CT Brain without contrast.  History: Altered mental status  Technique: Axial images of the head were obtained from the vertex through the skull base without the administration of contrast.  Sagittal and coronal reformatted images were obtained.   AEC (automated exposure control) and/or manual techniques such as size-specific kV and mAs are employed where appropriate to reduce radiation exposure for all CT exams.  Comparison: March 13, 2022  Findings: There is no acute intraparenchymal or extra-axial hemorrhage.  No mass effect or midline shift. No CT findings of acute territorial infarction. Periventricular white matter changes are seen compatible with small vessel angiopathy. The ventricles are not significantly changed in size. No acute calvarial abnormality. The mastoid air cells are clear. Chronic opacification of the right maxillary sinus with reactive hyperostosis.    1.    No CT findings of acute intracranial abnormality. 2.    Chronic opacification of the right maxillary sinus.  Signed (Electronic Signature): 12/15/2023 7:58 PM Signed By: [name] Platt, MD  XR Chest Portable Result Date: 12/15/2023 Exam:  Portable Chest  History:  Chest pain.  Technique:  Single frontal view.  Comparison:  07/04/2023.  Findings:   Similar mild prominence of the cardiopericardial silhouette.  Prominence of the pulmonary interstitium appears increased.  No focal consolidation.  No pleural effusion or pneumothorax.  No acute fracture.    Mild increased prominence of the pulmonary interstitium.  This may be secondary to pulmonary edema or atypical infection.    Signed (Electronic Signature): 12/15/2023 7:43 PM Signed By: Korene Schneider, MD   Lab Results   Recent Labs    12/17/23 0414  WBC 6.1  HGB 12.8  HCT 36.7  PLT 197   Recent Labs    12/15/23 1923 12/16/23 0535 12/17/23 0414  NA 136   < > 138  K 2.7*   < > 3.4*  CL 98    < > 99  CO2 28.1   < > 30.3  BUN 20   < > 15  CREATININE 1.16*   < > 1.17*  GLU 143   < > 99  CALCIUM  8.8   < > 9.2  ALBUMIN  3.1*  --   --   PROT 7.4  --   --   BILITOT 1.1  --   --   AST 179*  --   --   ALT 99*  --   --   ALKPHOS 504*  --   --   MG 1.4*  --  1.6  LIPASE 17  --   --   TSH 0.659  --   --    < > = values in this interval not displayed.   Recent Labs    12/16/23 0535  TROPONINI 12  INR 2.45   Recent Labs    12/15/23 1931  NITRITE Negative  LEUKOCYTESUR Negative  BLOODU Negative  GLUCOSEU Negative  PROTEINUA Trace*  KETONESU Negative   No results for input(s): OPIAU, BENZU, TRICYCLIC, PCPU, AMPHU, COCAU, CANNAU, BARBU, ETOH, ACETAMIN, SALICYLATE in the last 72 hours. No results for input(s): PREGTESTUR, PREGPOC in the last 72 hours. No results for input(s): OCCULTBLD, RAPSCRN, CDIFRPCR, CDIFFNAP1, A1C, CHOL, LDL, HDL, TRIG in the last  72 hours. No results for input(s): O2SOUR, FIO2ART, PHART, PCO2ART, PO2ART, HCO3ART, O2SATART, BEART in the last 72 hours.   Home Medications   Prior to Admission medications  Medication Dose, Route, Frequency  apixaban  (ELIQUIS ) 5 mg Tab 2 times a day (standard)  diltiazem  (TIAZAC ) 180 MG 24 hr capsule 120 mg, Daily (standard)  DULoxetine  (CYMBALTA ) 60 MG capsule   furosemide  (LASIX ) 40 MG tablet 1 tablet, Daily (standard)  isosorbide  mononitrate (IMDUR ) 60 MG 24 hr tablet 60 mg, Daily (standard)  magnesium  oxide (MAG-OX) 400 mg (241.3 mg elemental magnesium ) tablet 1 tablet, Daily (standard)  metoprolol  succinate (TOPROL -XL) 25 MG 24 hr tablet   nitroglycerin  (NITROSTAT ) 0.4 MG SL tablet 0.4 mg  pantoprazole  (PROTONIX ) 40 MG tablet No dose, route, or frequency recorded.   Brutus FORBES Shank, FNP Hospitalist, [facility] 12/18/23, 8:13 AM      [1] Past Medical History: Diagnosis Date  . Afib       . Anxiety   . CHF (congestive heart failure)       . Chronic  kidney disease   . COPD (chronic obstructive pulmonary disease)       . Decubitus ulcer   . Depression   . GERD (gastroesophageal reflux disease)   . Gout   . H/O hemorrhoids   . Hyperlipidemia   . Hypertension   . IBS (irritable bowel syndrome)   . PVD (peripheral vascular disease)   [2] Past Surgical History: Procedure Laterality Date  . APPENDECTOMY    . BACK SURGERY    . CHOLECYSTECTOMY    . EYE SURGERY    . HYSTERECTOMY    . KNEE SURGERY    . [facility] HISTORICAL RESULT)    [3] History reviewed. No pertinent family history. [4] No current facility-administered medications for this encounter.  Current Outpatient Medications:  .  apixaban  (ELIQUIS ) 5 mg Tab, Take by mouth two (2) times a day., Disp: , Rfl:  .  diltiazem  (TIAZAC ) 180 MG 24 hr capsule, Take 120 mg by mouth in the morning., Disp: , Rfl:  .  DULoxetine  (CYMBALTA ) 60 MG capsule, , Disp: , Rfl:  .  furosemide  (LASIX ) 40 MG tablet, Take 1 tablet (40 mg total) by mouth in the morning., Disp: , Rfl:  .  isosorbide  mononitrate (IMDUR ) 60 MG 24 hr tablet, Take 1 tablet (60 mg total) by mouth in the morning., Disp: , Rfl:  .  magnesium  oxide (MAG-OX) 400 mg (241.3 mg elemental magnesium ) tablet, Take 1 tablet by mouth in the morning., Disp: , Rfl:  .  metoprolol  succinate (TOPROL -XL) 25 MG 24 hr tablet, , Disp: , Rfl:  .  nitroglycerin  (NITROSTAT ) 0.4 MG SL tablet, Place 1 tablet (0.4 mg total) under the tongue., Disp: , Rfl:  .  pantoprazole  (PROTONIX ) 40 MG tablet, , Disp: , Rfl:

## 2023-12-18 NOTE — Transitions of Care (Post Inpatient/ED Visit) (Signed)
 Today's TOC FU Call Status: Today's TOC FU Call Status:: Successful TOC FU Call Completed TOC FU Call Complete Date: 12/18/23 Patient's Name and Date of Birth confirmed. Spoke with daughter, Nena. Patient staying with daughter, Navya Timmons post discharge.  Call was incomplete due to incoming call from PCP provider regarding HFU.  Will follow up with a call to attempt to complete on 12/19/23.   Transition Care Management Follow-up Telephone Call Date of Discharge: 12/17/23 Discharge Facility: Other [facility]) Name of Other (Non[facility]) Discharge Facility: [facility] Type of Discharge: Inpatient Admission Primary Inpatient Discharge Diagnosis:: Acute on chronic heart failure with preserved ejection fraction (CMS-HCC) - Primary How have you been since you were released from the hospital?: Better (Spoke with daugher Erminio, who stated patient was eating better and feeling okay today.) Any questions or concerns?: No  Items Reviewed: Did you receive and understand the discharge instructions provided?: Yes Medications obtained,verified, and reconciled?: Partial Review Completed Reason for Partial Mediation Review: Phone call was cut short due to call back from primary care provider to schedule a HFU. Do you have support at home?: Yes People in Home [RPT]: child(ren), adult Name of Support/Comfort Primary Source: Daughters, Devere and [name]  Medications Reviewed Today: PARTIAL REVIEW Medications Reviewed Today     Reviewed by Carolee Heron NOVAK, RN (Case Manager) on 12/18/23 at 1545  Med List Status: <None>   Medication Order Taking? Sig Documenting Provider Last Dose Status Informant  acetaminophen  (TYLENOL ) 500 MG tablet [id number] Yes Take 500 mg by mouth every 6 (six) hours as needed for headache or moderate pain. [provider]  Active   allopurinol  (ZYLOPRIM ) 100 MG tablet [id number]  Take 100 mg by mouth.  Patient not taking: Reported on 12/18/2023   [provider]  Active   ALPRAZolam  (XANAX ) 0.5 MG tablet [id number] Yes Take 0.5 mg by mouth daily as needed for anxiety or sleep. [provider]  Active   apixaban  (ELIQUIS ) 5 MG TABS tablet [id number] Yes Take 1 tablet (5 mg total) by mouth 2 (two) times daily. Waddell Danelle ORN, MD  Active   ascorbic acid  (VITAMIN C) 500 MG tablet [id number] Yes Take 1 tablet (500 mg total) by mouth daily. Pearlean Manus, MD  Active   cyanocobalamin  (VITAMIN B12) 1000 MCG tablet [id number] Yes Take 1 tablet (1,000 mcg total) by mouth every other day. Lamon Herter M, PA-C  Active   cyclobenzaprine  (FLEXERIL ) 5 MG tablet [id number]  Take 5 mg by mouth at bedtime.  Patient not taking: Reported on 12/18/2023   [provider]  Active   diltiazem  (CARDIZEM  CD) 180 MG 24 hr capsule [id number] Yes Take 1 capsule (180 mg total) by mouth daily before breakfast. Miriam Norris, NP  Active   diltiazem  (TIAZAC ) 120 MG 24 hr capsule [id number]  Take 1 capsule (120 mg total) by mouth at bedtime.  Patient not taking: Reported on 12/18/2023   Alvan Dorn FALCON, MD  Active            Med Note SYDELL, HERON NOVAK Debar Dec 18, 2023  3:26 PM) This looks like DUPLICATE  DULoxetine  (CYMBALTA ) 60 MG capsule [id number] Yes Take 60 mg by mouth daily. [provider]  Active   estradiol  (ESTRACE ) 0.1 MG/GM vaginal cream [id number]  Discard plastic applicator. Insert a blueberry size amount (approximately 1 gram) of cream on fingertip inside vagina at bedtime every night for 1 week then 2 nights per week for long term use. Gerldine Domino  C, FNP  Active   fluticasone  (CUTIVATE ) 0.05 % cream [id number]  Apply 1 application topically 2 (two) times daily as needed (skin tears). [provider]  Active   furosemide  (LASIX ) 40 MG tablet [id number] Yes Take 1 tablet (40 mg total) by mouth daily. Miriam Norris, NP  Active   gentamicin (GARAMYCIN) 0.3 % ophthalmic solution [id number]  1 drop 2 (two) times daily. [provider]  Active   HYDROcodone -acetaminophen  (NORCO/VICODIN) 5-325 MG tablet [id number]  Take 1 tablet by mouth every 6 (six) hours as needed. [provider]  Active   hydrocortisone  (ANUSOL -HC) 2.5 % rectal cream [id number]  Place 1 application rectally 2 (two) times daily. Rudy Josette RAMAN, PA-C  Active Self  iron polysaccharides (NIFEREX) 150 MG capsule [id number]  Take 150 mg by mouth. [provider]  Active   isosorbide  mononitrate (IMDUR ) 30 MG 24 hr tablet [id number] Yes Take 1 tablet (30 mg total) by mouth daily. Miriam Norris, NP  Active   magnesium  oxide (MAG-OX) 400 (240 Mg) MG tablet [id number]  Take 1 tablet by mouth daily. [provider]  Active   metoprolol  succinate (TOPROL -XL) 50 MG 24 hr tablet [id number]  Take 1 tablet (50 mg total) by mouth 2 (two) times daily. Take with or immediately following a meal. Waddell Danelle ORN, MD  Expired 06/08/23 2359            Med Note (Unknown Flannigan B   Tue Dec 18, 2023  3:37 PM) This medication was on CONTINUE TAKING Medications on DC Summary on 12/17/23.   Multiple Vitamins-Minerals (HAIR/SKIN/NAILS) CAPS [id number]  Take 1 tablet by mouth daily. [provider]  Active Self  Naphazoline HCl (CLEAR EYES OP) [id number]  Place 1 drop into both eyes daily as needed (itching). [provider]  Active   nitroGLYCERIN  (NITROSTAT ) 0.4 MG SL tablet [id number] Yes DISSOLVE ONE TABLET UNDER TONGUE EVERY 5 MINUTES UP TO 3 DOSES AS NEEDED FOR CHEST PAIN.  Patient taking differently: DISSOLVE ONE TABLET UNDER TONGUE EVERY 5 MINUTES UP TO 3 DOSES AS NEEDED FOR CHEST PAIN.   Alvan Dorn FALCON, MD  Active   ondansetron  (ZOFRAN -ODT) 4 MG disintegrating tablet [id number]  Take 4 mg by mouth every 8 (eight) hours as needed. [provider]  Active   pantoprazole  (PROTONIX ) 40 MG tablet [id number] Yes TAKE ONE TABLET BY MOUTH TWICE DAILY BEFORE MEALS Rudy, Kristen S, PA-C  Active   potassium chloride  (KLOR-CON  M)  10 MEQ tablet [id number]  Take 1 tablet by mouth daily. [provider]  Active   potassium chloride  (KLOR-CON ) 10 MEQ tablet [id number] Yes Take 2 tablets (20 mEq total) by mouth 2 (two) times daily. Miriam Norris, NP  Active            Med Note SYDELL, Yaak B   Tue Dec 18, 2023  3:38 PM) Taking per partial medication review on 12/18/23 with Daughter, Devere as informant.   traMADol  (ULTRAM ) 50 MG tablet [id number] Yes Take 1 tablet (50 mg total) by mouth every 6 (six) hours as needed. Sater, Charlie LABOR, MD  Active   TRELEGY ELLIPTA 100-62.5-25 MCG/ACT AEPB [id number]  Take 1 puff by mouth daily. [provider]  Active   triamcinolone cream (KENALOG) 0.1 % [id number]  Apply topically 2 (two) times daily. [provider]  Active   zinc  sulfate 220 (50 Zn) MG capsule [id number]  Take 1 capsule (220 mg total) by mouth daily. Pearlean Manus, MD  Active  Med List Note Sydell Heron NOVAK, RN 12/18/23 1540): Partial medication review completed on 12/18/23 with daughter, Devere, as informant/caretaker. Call was cut short by a call from PCP office. Only ones marked taking on today's date of 12/18/23 were verified as taking and /or on DC Summary medication list.             Home Care and Equipment/Supplies: Were Home Health Services Ordered?: Yes Name of Home Health Agency:: [facility] Has Agency set up a time to come to your home?: Yes First Home Health Visit Date: 12/19/23 (Per daughter) Any new equipment or medical supplies ordered?: No  Functional Questionnaire: Do you need assistance with bathing/showering or dressing?: Yes (Daughters manage this when/where patient is unable to.) Do you need assistance with meal preparation?: Yes (Daughters manage this when/where patient is unable to.) Do you need assistance with eating?: No Do you have difficulty maintaining continence: No (Daughters manage this when/where patient is unable to.) Do you need assistance with getting out of  bed/getting out of a chair/moving?: Yes (Daughters manage this when/where patient is unable to.) Do you have difficulty managing or taking your medications?: Yes (Daughters manage this when/where patient is unable to.)  Follow up appointments reviewed: PCP Follow-up appointment confirmed?:  (Call was cut short as PCP office had called back for HFU appointment.) Specialist Hospital Follow-up appointment confirmed?: Yes Date of Specialist follow-up appointment?: 01/08/24 Follow-Up Specialty Provider:: Hematology for Fe levels Do you need transportation to your follow-up appointment?: No Do you understand care options if your condition(s) worsen?: Yes-patient verbalized understanding (Per Daughter Devere)  12/18/23: TOC RN CM successful outreach to patient's daughter, Nena, but call was not completed due to incoming call from PCP for HFU appointment.  Will need to have a follow up to complete medication review/assessment/offer follow ups.  Daughter stated patient was doing okay today and was eating better at home, patient had told daughter about home health from [place] coming out.  TOC RN CM will attempt follow up call 12/19/23 to complete post discharge follow up call with follow up calls offered.   Bing Edison MSN, RN RN Case Sales executive Health  [facility] Office Hours M-F [phone] Direct Dial: [phone] Main Phone [phone]  Fax: [phone] [email or web address]

## 2023-12-19 DIAGNOSIS — D649 Anemia, unspecified: Secondary | ICD-10-CM | POA: Diagnosis not present

## 2023-12-19 DIAGNOSIS — E876 Hypokalemia: Secondary | ICD-10-CM | POA: Diagnosis not present

## 2023-12-19 DIAGNOSIS — I5033 Acute on chronic diastolic (congestive) heart failure: Secondary | ICD-10-CM | POA: Diagnosis not present

## 2023-12-19 DIAGNOSIS — R197 Diarrhea, unspecified: Secondary | ICD-10-CM | POA: Diagnosis not present

## 2023-12-21 DIAGNOSIS — M109 Gout, unspecified: Secondary | ICD-10-CM | POA: Diagnosis not present

## 2023-12-21 DIAGNOSIS — D509 Iron deficiency anemia, unspecified: Secondary | ICD-10-CM | POA: Diagnosis not present

## 2023-12-21 DIAGNOSIS — I5033 Acute on chronic diastolic (congestive) heart failure: Secondary | ICD-10-CM | POA: Diagnosis not present

## 2023-12-21 DIAGNOSIS — G4733 Obstructive sleep apnea (adult) (pediatric): Secondary | ICD-10-CM | POA: Diagnosis not present

## 2023-12-21 DIAGNOSIS — K219 Gastro-esophageal reflux disease without esophagitis: Secondary | ICD-10-CM | POA: Diagnosis not present

## 2023-12-21 DIAGNOSIS — K589 Irritable bowel syndrome without diarrhea: Secondary | ICD-10-CM | POA: Diagnosis not present

## 2023-12-21 DIAGNOSIS — I2510 Atherosclerotic heart disease of native coronary artery without angina pectoris: Secondary | ICD-10-CM | POA: Diagnosis not present

## 2023-12-21 DIAGNOSIS — Z9181 History of falling: Secondary | ICD-10-CM | POA: Diagnosis not present

## 2023-12-21 DIAGNOSIS — I130 Hypertensive heart and chronic kidney disease with heart failure and stage 1 through stage 4 chronic kidney disease, or unspecified chronic kidney disease: Secondary | ICD-10-CM | POA: Diagnosis not present

## 2023-12-21 DIAGNOSIS — J449 Chronic obstructive pulmonary disease, unspecified: Secondary | ICD-10-CM | POA: Diagnosis not present

## 2023-12-21 DIAGNOSIS — Z7901 Long term (current) use of anticoagulants: Secondary | ICD-10-CM | POA: Diagnosis not present

## 2023-12-21 DIAGNOSIS — E785 Hyperlipidemia, unspecified: Secondary | ICD-10-CM | POA: Diagnosis not present

## 2023-12-21 DIAGNOSIS — D631 Anemia in chronic kidney disease: Secondary | ICD-10-CM | POA: Diagnosis not present

## 2023-12-21 DIAGNOSIS — R1319 Other dysphagia: Secondary | ICD-10-CM | POA: Diagnosis not present

## 2023-12-21 DIAGNOSIS — Z955 Presence of coronary angioplasty implant and graft: Secondary | ICD-10-CM | POA: Diagnosis not present

## 2023-12-21 DIAGNOSIS — F419 Anxiety disorder, unspecified: Secondary | ICD-10-CM | POA: Diagnosis not present

## 2023-12-21 DIAGNOSIS — I739 Peripheral vascular disease, unspecified: Secondary | ICD-10-CM | POA: Diagnosis not present

## 2023-12-21 DIAGNOSIS — I7143 Infrarenal abdominal aortic aneurysm, without rupture: Secondary | ICD-10-CM | POA: Diagnosis not present

## 2023-12-21 DIAGNOSIS — E039 Hypothyroidism, unspecified: Secondary | ICD-10-CM | POA: Diagnosis not present

## 2023-12-21 DIAGNOSIS — I5030 Unspecified diastolic (congestive) heart failure: Secondary | ICD-10-CM | POA: Diagnosis not present

## 2023-12-21 DIAGNOSIS — J849 Interstitial pulmonary disease, unspecified: Secondary | ICD-10-CM | POA: Diagnosis not present

## 2023-12-21 DIAGNOSIS — I4891 Unspecified atrial fibrillation: Secondary | ICD-10-CM | POA: Diagnosis not present

## 2023-12-21 DIAGNOSIS — N189 Chronic kidney disease, unspecified: Secondary | ICD-10-CM | POA: Diagnosis not present

## 2023-12-25 ENCOUNTER — Telehealth: Payer: Self-pay

## 2023-12-25 ENCOUNTER — Other Ambulatory Visit: Payer: Self-pay

## 2023-12-25 NOTE — Transitions of Care (Post Inpatient/ED Visit) (Signed)
 12/25/2023  Patient ID: [name], female   DOB: 1932/11/04, 89 y.o.   MRN: [id number]  12/25/23: Follow up outreach for Successful outreach on 12/18/23 +one more call as it was interrupted by PCP calling for HFU which was complete on 12/18/23 that day at 6 pm per daughter, [name] who was informant for the first outreach as well as today.   Per [name], patient is doing okay, has a bit of a sore throat. Home health has been in touch and out to see patient both PT and RN. [name] stated RN reported patient's lungs were clear.   Follow up appointments reviewed and no medication changes reported since discharge. [name] declined need for further follow up calls but was appreciated of TOC RN CM calling to check on situation as a +one follow up.    Bing Edison MSN, RN RN Case [employer] executive [employer]  [facility] Office Hours M-F [phone] Direct Dial: [phone] Main Phone [phone]  Fax: [phone] [email or web address]

## 2024-01-01 DIAGNOSIS — I10 Essential (primary) hypertension: Secondary | ICD-10-CM | POA: Diagnosis not present

## 2024-01-01 DIAGNOSIS — N183 Chronic kidney disease, stage 3 unspecified: Secondary | ICD-10-CM | POA: Diagnosis not present

## 2024-01-01 DIAGNOSIS — R739 Hyperglycemia, unspecified: Secondary | ICD-10-CM | POA: Diagnosis not present

## 2024-01-01 DIAGNOSIS — R768 Other specified abnormal immunological findings in serum: Secondary | ICD-10-CM | POA: Diagnosis not present

## 2024-01-01 DIAGNOSIS — E876 Hypokalemia: Secondary | ICD-10-CM | POA: Diagnosis not present

## 2024-01-15 DIAGNOSIS — I2510 Atherosclerotic heart disease of native coronary artery without angina pectoris: Secondary | ICD-10-CM | POA: Diagnosis not present

## 2024-01-15 DIAGNOSIS — M109 Gout, unspecified: Secondary | ICD-10-CM | POA: Diagnosis not present

## 2024-01-16 ENCOUNTER — Telehealth: Payer: Self-pay | Admitting: *Deleted

## 2024-01-16 ENCOUNTER — Ambulatory Visit (INDEPENDENT_AMBULATORY_CARE_PROVIDER_SITE_OTHER): Admitting: Gastroenterology

## 2024-01-16 ENCOUNTER — Encounter: Payer: Self-pay | Admitting: *Deleted

## 2024-01-16 ENCOUNTER — Encounter: Payer: Self-pay | Admitting: Gastroenterology

## 2024-01-16 VITALS — BP 139/82 | HR 86 | Temp 97.8°F | Ht 61.0 in | Wt 147.8 lb

## 2024-01-16 DIAGNOSIS — K219 Gastro-esophageal reflux disease without esophagitis: Secondary | ICD-10-CM

## 2024-01-16 DIAGNOSIS — R634 Abnormal weight loss: Secondary | ICD-10-CM

## 2024-01-16 DIAGNOSIS — R748 Abnormal levels of other serum enzymes: Secondary | ICD-10-CM | POA: Diagnosis not present

## 2024-01-16 DIAGNOSIS — R7989 Other specified abnormal findings of blood chemistry: Secondary | ICD-10-CM

## 2024-01-16 DIAGNOSIS — K529 Noninfective gastroenteritis and colitis, unspecified: Secondary | ICD-10-CM

## 2024-01-16 DIAGNOSIS — R197 Diarrhea, unspecified: Secondary | ICD-10-CM

## 2024-01-16 NOTE — Telephone Encounter (Signed)
 Pt's daughter Devere informed that [name] was scheduling into November for CT. She asked if order could be sent to [facility] to have CT. Order faxed to [phone]

## 2024-01-16 NOTE — Patient Instructions (Signed)
 For your reflux right now would you to increase your pantoprazole  to twice daily, at least 30 minutes prior to breakfast and dinner.  Follow a GERD diet:  Avoid fried, fatty, greasy, spicy, citrus foods. Avoid caffeine and carbonated beverages. Avoid chocolate. Try eating 4-6 small meals a day rather than 3 large meals. Do not eat within 3 hours of laying down. Prop head of bed up on wood or bricks to create a 6 inch incline.   For your elevated liver enzymes: Please complete blood work at [facility] -this is to further evaluate the elevation in your liver function. Obtaining a CT scan to further assess your bile ducts and your liver as well as to assess for your weight loss and lack of appetite. Follow a low-fat diet is much as possible. Avoid any alcohol  use  For diarrhea: Complete the stool studies Use up to 10 mg of Imodium  daily (typically 2 mg tablets, can take up to 5 of these daily). Use Lactaid tablets if consuming any dairy products Limit dairy use is much as possible Continue to follow a BRAT diet, and low-fat foods. Avoid any NSAIDs -ibuprofen, Aleve, Advil, meloxicam.  We will be in touch with results as we receive them.  Will follow-up in the office in 6 weeks.  It was a pleasure to see you today. I want to create trusting relationships with patients. If you receive a survey regarding your visit,  I greatly appreciate you taking time to fill this out on paper or through your MyChart. I value your feedback.  Charmaine Melia, MSN, FNP-BC, AGACNP-BC [facility]

## 2024-01-16 NOTE — Progress Notes (Signed)
 GI Office Note    Referring Provider: Jeanette Comer BRAVO, * Primary Care Physician:  [name], [name], PA-C  Primary Gastroenterologist: Lamar HERO.Rourk, MD  Chief Complaint   Chief Complaint  Patient presents with   Diarrhea    Had diarrhea for weeks. Went to the hospital.  Now having black watery stools.   History of Present Illness   [name] is a 89 y.o. female presenting today at the request of [name], [name], * for diarrhea and reports of black watery stools.  Referral states elevated liver enzymes.  EGD November 2021 with normal examined esophagus s/p dilation, erythematous mucosa in the stomach with biopsy showing mild gastropathy and negative H. pylori.  Normal duodenum at that time.  Colonoscopy March 2022: - Entire colon normal - Internal/external hemorrhoids - Pressure sore to left buttocks - Normal TI, random colonic biopsies taken. - Biopsies consistent with mild acute/subacute nonspecific colitis, negative for features of chronicity.  Differentials include ischemia, drug effect, or infection.  Doctor recommended trial of Entocort 9 mg daily for 1 month and follow-up in the clinic.  In 2021 she presented with some diarrhea for 3 weeks with about 9 bowel movements per day for couple of days that then resolved.  She also noted some rectal bleeding at that time.  She had recently been on antibiotics for UTI.  Follow-up stool studies revealed negative C. difficile and GI pathogen panel.  She was given prescription for Colestid  2 g daily for diarrhea and await colonoscopy.  OV April 2022 with [facility].  At this visit she noted her GERD was well-controlled with medication and denied any NSAID use.  Had not been taking oral iron.  Noted pain in her lower abdomen 9 out of 10 in severity and reports some mild pain that started about 6 months prior.  She denied any constipation or diarrhea at that time.  She reportedly was taking Entocort daily which  significantly improved her diarrhea and never tried the Colestid .  Admitted to dysuria for 2 weeks.  She was advised labs as well as UA and urine culture.  Given her abdominal pain CT abdomen pelvis was ordered.  She is advised to continue Entocort 9 mg daily for now and plans to discuss this with Dr. Shaaron and consider tapering after the 8 weeks.  Last office visit in July 2022.  Prior plans with Dr. Darlean were to complete 8 weeks of Entocort and decrease via taper thereafter.  She had a UA and urine culture that was positive for UTI with E. coli and recommended treatment.  Recommended proceed with givens capsule given low stable hemoglobin in the 9 range.  Her CT scan in April 2022 revealed no acute findings but moderate amount of gas and stool within the colon.  She underwent agile capsule which made to the cecum and therefore then scheduled for givens capsule in June 2022 which was then rescheduled to July 2022.  During this office visit she reported daily bowel movements which were formed and no diarrhea but was still taking Entocort 3 mg daily at that time.  Noted a hemorrhoid that was bleeding with every bowel movement.  Reflux well-controlled.  Not taking oral iron at the time.  Was last received blood in October 2021 when her hemoglobin was 6.7 at that time.  Givens capsule July 2022 with several nonbleeding erosions in the pylorus and proximal small bowel with unclear significance.  Several lymphangiectasia's also noted in the distal small bowel  without any evidence of mass.  She was advised to continue to avoid NSAIDs and monitor H&H closely.  Most recent labs performed on 12/16/2023 with hemoglobin stable at 12.9, platelets 191, magnesium  1.6, potassium 3.4, creatinine 1.17, GFR 45  Per referral dated 9/25 patient was referred for elevated liver function test.  Per labs dated 12/19/2023 she had a creatinine of 1.32, alk phos 514, AST 59, ALT 92, sodium 137, albumin  3.5, hemoglobin 13.8, platelets  265.   Today:  Discussed the use of AI scribe software for clinical note transcription with the patient, who gave verbal consent to proceed.  She has been experiencing severe diarrhea for the past three weeks, characterized by watery, black stools. Initially, she had nine episodes per day, which have decreased to about three episodes daily. She has been taking Imodium , two pills in the morning, one in afternoon, and one at bedtime, totaling three to four doses per day. She also tried Pepto Bismol, which may have contributed to the black color of her stools.  No black stools prior to this. Prior to the last 3 weeks her bowel movements were regular consistency.  She has a history of nonspecific colitis diagnosed via colonoscopy in 2022, which was previously managed with Entocort. She has experienced frequent UTIs and has been on multiple antibiotics in the past. She reports a significant weight loss from 157-159 lbs to 147 lbs over the last several weeks with the diarrhea. Her appetite is poor, and she experiences nausea, especially when attempting to eat.  She has a history of elevated liver enzymes as well as noted by PCP on labs earlier this month. She has been on Eliquis  and previously had an adverse reaction to allopurinol  or colchicine , which caused diarrhea. She denies recent antibiotic use. She has a history of heartburn and takes pantoprazole  once daily for this and has had an exacerbation of symptoms.  She experiences mild abdominal pain described as 'irritating' and 'cramping,' which occurs at any time. She also reports weakness, chest pain, and a history of anemia, for which she received iron infusions in the past. She has no gallbladder, ovaries, or uterus, and has had her appendix removed. She has a history of bladder problems and surgeries.  In the review of symptoms, she reports no vomiting but experiences nausea, weakness, and chest pain (baseline). No recent antibiotic use and has not  taken allopurinol /colchicine  since it was added to her allergy list.      Wt Readings from Last 5 Encounters:  01/16/24 147 lb 12.8 oz (67 kg)  09/04/23 153 lb 10.6 oz (69.7 kg)  04/06/23 160 lb 12.8 oz (72.9 kg)  03/20/23 159 lb 8 oz (72.3 kg)  03/06/23 162 lb (73.5 kg)    Current Outpatient Medications  Medication Sig Dispense Refill   acetaminophen  (TYLENOL ) 500 MG tablet Take 500 mg by mouth every 6 (six) hours as needed for headache or moderate pain.     ALPRAZolam  (XANAX ) 0.5 MG tablet Take 0.5 mg by mouth daily as needed for anxiety or sleep.     apixaban  (ELIQUIS ) 5 MG TABS tablet Take 1 tablet (5 mg total) by mouth 2 (two) times daily. 180 tablet 3   ascorbic acid  (VITAMIN C) 500 MG tablet Take 1 tablet (500 mg total) by mouth daily. 30 tablet 2   cyanocobalamin  (VITAMIN B12) 1000 MCG tablet Take 1 tablet (1,000 mcg total) by mouth every other day. 90 tablet 1   diltiazem  (CARDIZEM  CD) 180 MG 24 hr capsule  Take 1 capsule (180 mg total) by mouth daily before breakfast. 90 capsule 3   DULoxetine  (CYMBALTA ) 60 MG capsule Take 60 mg by mouth daily.     estradiol  (ESTRACE ) 0.1 MG/GM vaginal cream Discard plastic applicator. Insert a blueberry size amount (approximately 1 gram) of cream on fingertip inside vagina at bedtime every night for 1 week then 2 nights per week for long term use. 30 g 3   fluticasone  (CUTIVATE ) 0.05 % cream Apply 1 application topically 2 (two) times daily as needed (skin tears).     furosemide  (LASIX ) 40 MG tablet Take 1 tablet (40 mg total) by mouth daily. 135 tablet 3   gentamicin (GARAMYCIN) 0.3 % ophthalmic solution 1 drop 2 (two) times daily.     HYDROcodone -acetaminophen  (NORCO/VICODIN) 5-325 MG tablet Take 1 tablet by mouth every 6 (six) hours as needed.     hydrocortisone  (ANUSOL -HC) 2.5 % rectal cream Place 1 application rectally 2 (two) times daily. 30 g 1   iron polysaccharides (NIFEREX) 150 MG capsule Take 150 mg by mouth.     isosorbide  mononitrate  (IMDUR ) 30 MG 24 hr tablet Take 1 tablet (30 mg total) by mouth daily. 90 tablet 3   magnesium  oxide (MAG-OX) 400 (240 Mg) MG tablet Take 1 tablet by mouth daily.     metoprolol  succinate (TOPROL -XL) 50 MG 24 hr tablet Take 1 tablet (50 mg total) by mouth 2 (two) times daily. Take with or immediately following a meal. 180 tablet 3   Multiple Vitamins-Minerals (HAIR/SKIN/NAILS) CAPS Take 1 tablet by mouth daily.     Naphazoline HCl (CLEAR EYES OP) Place 1 drop into both eyes daily as needed (itching).     nitroGLYCERIN  (NITROSTAT ) 0.4 MG SL tablet DISSOLVE ONE TABLET UNDER TONGUE EVERY 5 MINUTES UP TO 3 DOSES AS NEEDED FOR CHEST PAIN. (Patient taking differently: PRN) 25 tablet 6   ondansetron  (ZOFRAN -ODT) 4 MG disintegrating tablet Take 4 mg by mouth every 8 (eight) hours as needed.     pantoprazole  (PROTONIX ) 40 MG tablet TAKE ONE TABLET BY MOUTH TWICE DAILY BEFORE MEALS 60 tablet 5   potassium chloride  (KLOR-CON ) 10 MEQ tablet Take 2 tablets (20 mEq total) by mouth 2 (two) times daily. (Patient taking differently: Take 20 mEq by mouth once.) 120 tablet 6   traMADol  (ULTRAM ) 50 MG tablet Take 1 tablet (50 mg total) by mouth every 6 (six) hours as needed. 90 tablet 3   TRELEGY ELLIPTA 100-62.5-25 MCG/ACT AEPB Take 1 puff by mouth daily.     triamcinolone cream (KENALOG) 0.1 % Apply topically 2 (two) times daily.     zinc  sulfate 220 (50 Zn) MG capsule Take 1 capsule (220 mg total) by mouth daily. 30 capsule 3   allopurinol  (ZYLOPRIM ) 100 MG tablet Take 100 mg by mouth. (Patient not taking: Reported on 01/16/2024)     cyclobenzaprine  (FLEXERIL ) 5 MG tablet Take 5 mg by mouth at bedtime. (Patient not taking: Reported on 01/16/2024)     diltiazem  (TIAZAC ) 120 MG 24 hr capsule Take 1 capsule (120 mg total) by mouth at bedtime. (Patient not taking: Reported on 01/16/2024) 90 capsule 1   potassium chloride  (KLOR-CON  M) 10 MEQ tablet Take 1 tablet by mouth daily. (Patient not taking: Reported on 12/18/2023)      No current facility-administered medications for this visit.    Past Medical History:  Diagnosis Date   Anxiety    Arthritis    Atrial fibrillation (HCC)    Bursitis  Left shoulder   Cataract    CHF (congestive heart failure) (HCC)    CKD (chronic kidney disease)    stage 3-4   COPD (chronic obstructive pulmonary disease) (HCC)    Coronary atherosclerosis of native coronary artery    a. s/p DES to LCx in 04/2013 b. cath in 11/2015 showing patent stent with 20% prox-LAD and 80% ostial RCA stenosis for which medical management was recommended due to small artery size   Depression    Diastolic heart failure (HCC)    EF 55-60%   Dysphagia, unspecified(787.20)    Dyspnea    Dysrhythmia    Essential hypertension    GERD (gastroesophageal reflux disease)    Hx Schatzki's ring, multiple EGD/ED last 01/06/2004   Gout    Headache    History of anemia    Hyperlipidemia    Internal hemorrhoids without mention of complication    MI (myocardial infarction) (HCC) 2006   Microscopic colitis 2003   Panic disorder without agoraphobia    Paresthesia    Pneumonia 12/2011   PVD (peripheral vascular disease)    S/P colonoscopy 09/27/2001   internal hemorrhoids, desc colon inflam polyp, SB BX-chronic duodenitis, colitis   Sleep apnea    Thyroid  disease     Past Surgical History:  Procedure Laterality Date   ABDOMINAL HYSTERECTOMY     ABDOMINAL HYSTERECTOMY     AGILE CAPSULE N/A 08/30/2020   Procedure: AGILE CAPSULE;  Surgeon: Shaaron Lamar HERO, MD;  [facility]: [facility];  Service: Endoscopy;  Laterality: N/A;  7:30am   ANTERIOR AND POSTERIOR REPAIR     with resection of vagina   ANTERIOR LAT LUMBAR FUSION N/A 08/01/2016   Procedure: Lumbar Two-Lumbar Five Transpsoas lateral interbody fusion with Lumbar Two-Three lateral plate fixation;  Surgeon: Morene Hicks Ditty, MD;  Location: [facility] OR;  Service: Neurosurgery;  Laterality: N/A;  L2-5 Transpsoas lateral interbody fusion with L2-3  lateral plate fixation   APPENDECTOMY     BACK SURGERY     BIOPSY  07/05/2015   Procedure: BIOPSY;  Surgeon: Lamar HERO Shaaron, MD;  Location: [facility];  Service: Endoscopy;;  gastric polyp biopsy, ascending colon biopsy   BIOPSY  02/16/2020   Procedure: BIOPSY;  Surgeon: Shaaron Lamar HERO, MD;  [facility]: [facility];  Service: Endoscopy;;   BIOPSY  06/28/2020   Procedure: BIOPSY;  Surgeon: Shaaron Lamar HERO, MD;  [facility]: [facility];  Service: Endoscopy;;   BLADDER SUSPENSION  11/09/2011   Procedure: TRANSVAGINAL TAPE (TVT) PROCEDURE;  Surgeon: Emery LILLETTE Blaze, MD;  Location: [facility];  Service: Urology;  Laterality: N/A;   bladder tack  06/2010   BREAST LUMPECTOMY  1998   left, benign   CARDIAC CATHETERIZATION     CARDIAC CATHETERIZATION     CARDIAC CATHETERIZATION N/A 12/16/2015   Procedure: Left Heart Cath and Coronary Angiography;  Surgeon: Debby DELENA Sor, MD;  Location: [facility] INVASIVE CV LAB;  Service: Cardiovascular;  Laterality: N/A;   CARDIOVERSION N/A 10/04/2017   Procedure: CARDIOVERSION;  Surgeon: Charls Pearla DELENA, MD;  Location: [facility];  Service: Cardiovascular;  Laterality: N/A;   CARDIOVERSION N/A 01/30/2018   Procedure: CARDIOVERSION;  Surgeon: Charls Pearla DELENA, MD;  [facility];  Service: Cardiovascular;  Laterality: N/A;   CARDIOVERSION N/A 11/10/2019   Procedure: CARDIOVERSION;  Surgeon: Delford Maude BROCKS, MD;  Location: [facility] ENDOSCOPY;  Service: Cardiovascular;  Laterality: N/A;   CARPAL TUNNEL RELEASE  1989   left   cataract surgery  CHOLECYSTECTOMY  1998   Cholecystectomy     COLONOSCOPY  03/16/2011   multiple hyperplastic colon polyps, sigmoid diverticulosis, melanosis coli   COLONOSCOPY WITH PROPOFOL  N/A 07/05/2015   RMR:one 5 mm polyp in descending colon   COLONOSCOPY WITH PROPOFOL  N/A 06/28/2020   Procedure: COLONOSCOPY WITH PROPOFOL ;  Surgeon: Shaaron Lamar HERO, MD;  [facility];  Service: Endoscopy;  Laterality: N/A;  am appt    CORONARY ANGIOGRAPHY N/A 05/16/2018   Procedure: CORONARY ANGIOGRAPHY (CATH LAB);  Surgeon: Claudene Victory ORN, MD;  Location: [facility] INVASIVE CV LAB;  Service: Cardiovascular;  Laterality: N/A;   CORONARY ANGIOPLASTY WITH STENT PLACEMENT  2015   ESOPHAGEAL DILATION N/A 07/05/2015   Procedure: ESOPHAGEAL DILATION;  Surgeon: Lamar HERO Shaaron, MD;  [facility];  Service: Endoscopy;  Laterality: N/A;   ESOPHAGOGASTRODUODENOSCOPY (EGD) WITH PROPOFOL  N/A 07/05/2015   MFM:[id number]   ESOPHAGOGASTRODUODENOSCOPY (EGD) WITH PROPOFOL  N/A 02/16/2020   Procedure: ESOPHAGOGASTRODUODENOSCOPY (EGD) WITH PROPOFOL ;  Surgeon: Shaaron Lamar HERO, MD;  Normal examined esophagus s/p dilation, erythematous mucosa in the stomach s/p biopsy, normal examined duodenum. Pathology with mild gastropathy, negative for H. Pylori.    GIVENS CAPSULE STUDY N/A 11/10/2020   Procedure: GIVENS CAPSULE STUDY;  Surgeon: Shaaron Lamar HERO, MD;  [facility];  Service: Endoscopy;  Laterality: N/A;  7:30am   JOINT REPLACEMENT Right 2007   right knee   left hand surgery     LEFT HEART CATHETERIZATION WITH CORONARY ANGIOGRAM N/A 05/14/2013   Procedure: LEFT HEART CATHETERIZATION WITH CORONARY ANGIOGRAM;  Surgeon: Ozell JONETTA Fell, MD;  Location: [facility] CATH LAB;  Service: Cardiovascular;  Laterality: N/A;   left rotator cuff surgery     LUMBAR LAMINECTOMY/DECOMPRESSION MICRODISCECTOMY N/A 10/11/2012   Procedure: LUMBAR LAMINECTOMY/DECOMPRESSION MICRODISCECTOMY 2 LEVELS;  Surgeon: Catalina HERO Stains, MD;  Location: [facility]O [facility];  Service: Neurosurgery;  Laterality: N/A;  L3-4 L4-5 Laminectomy   LUMBAR WOUND DEBRIDEMENT N/A 09/27/2015   Procedure: Exploration of Lumbar Wound w/ Repair CSF Leak/Lumbar Drain Placement;  Surgeon: Catalina Stains, MD;  Location: [facility] NEURO OR[facility];  Service: Neurosurgery;  Laterality: N/A;   MALONEY DILATION  03/16/2011   Gastritis. No H.pylori on bx. 34F maloney dilation with disruption of  occult cevical esophageal web    MALONEY DILATION N/A 02/16/2020   Procedure: MALONEY DILATION;  Surgeon: Shaaron Lamar HERO, MD;  [facility];  Service: Endoscopy;  Laterality: N/A;   NASAL SINUS SURGERY     right knee replacement  2007   right leg benign tumor     SHOULDER SURGERY Left    TEE WITHOUT CARDIOVERSION N/A 10/04/2017   Procedure: TRANSESOPHAGEAL ECHOCARDIOGRAM (TEE) WITH PROPOFOL ;  Surgeon: Charls Pearla LABOR, MD;  Location: [facility];  Service: Cardiovascular;  Laterality: N/A;   TEE WITHOUT CARDIOVERSION N/A 11/10/2019   Procedure: TRANSESOPHAGEAL ECHOCARDIOGRAM (TEE);  Surgeon: Delford Maude BROCKS, MD;  Location: [facility] ENDOSCOPY;  Service: Cardiovascular;  Laterality: N/A;   TONSILLECTOMY     unspecified area, hysterectomy  1972   partial    Family History  Problem Relation Age of Onset   Stroke Mother    Parkinson's disease Father    Coronary artery disease Other        family Hx-sons   Cancer Other    Stroke Other        family Hx   Hypertension Other        family Hx   Diabetes Brother    Heart disease Son  before age 17   Diabetes Son    Stroke Daughter 40   Colon cancer Grandson        diagnosed 40   Inflammatory bowel disease Neg Hx     Allergies as of 01/16/2024 - Review Complete 01/16/2024  Allergen Reaction Noted   Cephalosporins Diarrhea and Nausea Only 08/04/2015   Levaquin [levofloxacin in d5w] Swelling 07/06/2014   Macrodantin [nitrofurantoin macrocrystal] Swelling 07/06/2014   Phenothiazines Anaphylaxis and Hives 08/16/2011   Polysorbate Anaphylaxis 08/16/2011   Prednisone  Shortness Of Breath    Buspirone Itching 07/06/2014   Cardura [doxazosin mesylate] Itching 07/06/2014   Codeine Itching    Acyclovir and related Itching 10/07/2015   Colcrys  [colchicine ] Nausea Only 10/04/2018   Prochlorperazine Other (See Comments) 08/16/2011   Ranexa  [ranolazine ]  04/06/2015   Sulfa  antibiotics Other (See Comments) 02/06/2023   Atorvastatin Hives 08/16/2011   Colestipol   Palpitations 05/03/2020   Ofloxacin Rash    Other Itching and Rash 10/10/2012   Penicillins Other (See Comments) 11/09/2011   Pimozide Hives and Itching 08/16/2011   Zetia  [ezetimibe ] Itching and Rash 10/25/2022    Social History   Socioeconomic History   Marital status: Divorced    Spouse name: Not on file   Number of children: 5   Years of education: Not on file   Highest education level: Not on file  Occupational History   Occupation: retired  Tobacco Use   Smoking status: Former    Current packs/day: 0.00    Average packs/day: 1 pack/day for 64.0 years (64.0 ttl pk-yrs)    Types: Cigarettes    Start date: 12/24/1947    Quit date: 11/17/2001    Years since quitting: 22.1   Smokeless tobacco: Never   Tobacco comments:    Quit smoking in 2003  Vaping Use   Vaping status: Never Used  Substance and Sexual Activity   Alcohol  use: Not Currently   Drug use: No   Sexual activity: Never  Other Topics Concern   Not on file  Social History Narrative   Divorced.   Sister had colon perforation & died from complications in [place], [place]   Social Drivers of Health   Financial Resource Strain: Medium Risk (12/15/2023)   Received from [facility]   Overall Financial Resource Strain (CARDIA)    How hard is it for you to pay for the very basics like food, housing, medical care, and heating?: Somewhat hard  Food Insecurity: No Food Insecurity (12/15/2023)   Received from [facility]   Hunger Vital Sign    Within the past 12 months, you worried that your food would run out before you got the money to buy more.: Never true    Within the past 12 months, the food you bought just didn't last and you didn't have money to get more.: Never true  Transportation Needs: No Transportation Needs (12/15/2023)   Received from [facility] - Transportation    Lack of Transportation (Medical): No    Lack of Transportation (Non-Medical): No  Physical Activity: Inactive (12/15/2023)    Received from [facility]   Exercise Vital Sign    On average, how many days per week do you engage in moderate to strenuous exercise (like a brisk walk)?: 0 days    On average, how many minutes do you engage in exercise at this level?: 0 min  Stress: Stress Concern Present (12/15/2023)   Received from [facility]  of [facility] - Occupational Stress Questionnaire    Do you feel stress - tense, restless, nervous, or anxious, or unable to sleep at night because your mind is troubled all the time - these days?: Very much  Social Connections: Socially Isolated (12/15/2023)   Received from [facility]   Social Connection and Isolation Panel    In a typical week, how many times do you talk on the phone with family, friends, or neighbors?: More than three times a week    How often do you get together with friends or relatives?: More than three times a week    How often do you attend church or religious services?: Never    Do you belong to any clubs or organizations such as church groups, unions, fraternal or athletic groups, or school groups?: No    How often do you attend meetings of the clubs or organizations you belong to?: Never    Are you married, widowed, divorced, separated, never married, or living with a partner?: Divorced  Intimate Partner Violence: Not At Risk (12/15/2023)   Received from [facility]   Humiliation, Afraid, Rape, and Kick questionnaire    Within the last year, have you been afraid of your partner or ex-partner?: No    Within the last year, have you been humiliated or emotionally abused in other ways by your partner or ex-partner?: No    Within the last year, have you been kicked, hit, slapped, or otherwise physically hurt by your partner or ex-partner?: No    Within the last year, have you been raped or forced to have any kind of sexual activity by your partner or ex-partner?: No     Review of Systems   Gen: + Weight loss and  lack of appetite.  + fatigue.  Denies any fever, chills. CV: + intermittent chest pain. Denies  heart palpitations, peripheral edema, syncope.  Resp: + DOE. Denies shortness of breath at rest. Denies wheezing or cough.  GI: see HPI GU : Denies urinary burning, urinary frequency, urinary hesitancy Psych: Denies depression, anxiety, memory loss, and confusion Heme: Denies bruising, bleeding, and enlarged lymph nodes. + falls  Physical Exam   BP 139/82 (BP Location: Right Arm, Patient Position: Sitting, Cuff Size: Normal)   Pulse 86   Temp 97.8 F (36.6 C) (Temporal)   Ht 5' 1 (1.549 m)   Wt 147 lb 12.8 oz (67 kg)   SpO2 95%   BMI 27.93 kg/m   General:   Alert and oriented. Pleasant and cooperative. Well-nourished and well-developed.  Head:  Normocephalic and atraumatic. Eyes:  Without icterus, sclera clear and conjunctiva pink.  Ears:  Normal auditory acuity. Mouth:  No deformity or lesions, oral mucosa pink.  Lungs:  Clear to auscultation bilaterally. No wheezes, rales, or rhonchi. No distress.  Heart: Irregularly irregular. Abdomen:  +BS, soft, non-distended.  TTP to LLQ and RUQ, and epigastrium.  No HSM noted. No guarding or rebound. No masses appreciated.  Rectal:  deferred  Msk:  Symmetrical without gross deformities. Normal posture. Extremities:  Without edema. Neurologic:  Alert and  oriented x4;  grossly normal neurologically. Skin:  Intact without significant lesions or rashes. Psych:  Alert and cooperative. Normal mood and affect.  Assessment & Plan   [name] is a 89 y.o. female with a history of IDA, GERD, dysphagia s/p prior dilations, chronic intermittent via medic easy in the setting of hemorrhoids, adenomatous colon polyps, chronic intermittent diarrhea suspected to be  secondary to lactose intolerance, and nonspecific colitis presenting today for evaluation of elevated liver enzymes as well as diarrhea.     Chronic diarrhea with nonspecific colitis and  weight loss Recent exacerbation of chronic diarrhea, initially up to nine times daily, now reduced to three times daily. Diarrhea is watery and black, likely due to Pepto Bismol use. Nonspecific colitis identified via colonoscopy in 2022, treated with Entocort. Current management includes Imodium  and Pepto Bismol. Pepto Bismol not effective and Imodium  possible has only helped a little. Concerns for potential infection, dehydration, hypokalemia, and hypomagnesemia. History of frequent UTIs and antibiotic use may contribute to symptoms. Lactose intolerance considered as a contributing factor. - Order stool studies to rule out infection before starting Entocort. - If no infection is found, initiate Entocort for colitis. - Fecal elastase and fecal calprotectin also ordered for complete workup.  - Continue Imodium , up to 10 mg per day. - Avoid dairy products. - Consider Lactaid tablets for lactose exposure. - CT A/P for evaluation of weight loss  Elevated liver enzymes Elevated liver enzymes potentially related to medication use or underlying conditions. Allopurinol  discontinued due to adverse reactions, including diarrhea. Symptoms include nausea, anorexia, abdominal pain, and weight loss from 157 to 147 pounds. Elevated alkaline phosphatase suggests possible bile duct issue however this enzyme is also made by bones and intestines as well. History of cholecystectomy however cannot rule out choledocholithiasis. Was tender to RUQ on palpation today on exam.  - Order blood work to assess elevated liver function tests and other relevant parameters. (Viral Hep Pane, Ferrtin, CBC, CMP, ANA, AMA, ASMA, IgG, IgM, IgA, GGT, alk phos isoenzymes) - Order CT scan to evaluate bile ducts and rule out obstruction.  Gastroesophageal reflux disease (GERD) Symptoms of heartburn and acid reflux. Currently on pantoprazole  once daily with breakthrough symptoms.  - Increase pantoprazole  to 40 mg twice daily, taken 30  minutes before meals.      Follow up   Follow up 6 weeks.    Charmaine Melia, MSN, FNP-BC, AGACNP-BC [facility]

## 2024-01-17 DIAGNOSIS — R197 Diarrhea, unspecified: Secondary | ICD-10-CM | POA: Diagnosis not present

## 2024-01-17 DIAGNOSIS — I5033 Acute on chronic diastolic (congestive) heart failure: Secondary | ICD-10-CM | POA: Diagnosis not present

## 2024-01-17 DIAGNOSIS — E876 Hypokalemia: Secondary | ICD-10-CM | POA: Diagnosis not present

## 2024-01-17 DIAGNOSIS — I482 Chronic atrial fibrillation, unspecified: Secondary | ICD-10-CM | POA: Diagnosis not present

## 2024-01-18 ENCOUNTER — Other Ambulatory Visit (HOSPITAL_COMMUNITY)
Admission: RE | Admit: 2024-01-18 | Discharge: 2024-01-18 | Disposition: A | Discharge status: Home / Self Care | Source: Ambulatory Visit | Attending: Gastroenterology | Admitting: Gastroenterology

## 2024-01-18 DIAGNOSIS — R7989 Other specified abnormal findings of blood chemistry: Secondary | ICD-10-CM | POA: Insufficient documentation

## 2024-01-18 DIAGNOSIS — R748 Abnormal levels of other serum enzymes: Secondary | ICD-10-CM | POA: Insufficient documentation

## 2024-01-18 LAB — CBC
HCT: 41.4 % (ref 36.0–46.0)
Hemoglobin: 13.6 g/dL (ref 12.0–15.0)
MCH: 30 pg (ref 26.0–34.0)
MCHC: 32.9 g/dL (ref 30.0–36.0)
MCV: 91.4 fL (ref 80.0–100.0)
Platelets: 295 K/uL (ref 150–400)
RBC: 4.53 MIL/uL (ref 3.87–5.11)
RDW: 14.4 % (ref 11.5–15.5)
WBC: 9.6 K/uL (ref 4.0–10.5)
nRBC: 0 % (ref 0.0–0.2)

## 2024-01-18 LAB — HEPATITIS B CORE ANTIBODY, IGM: Hep B C IgM: NONREACTIVE

## 2024-01-18 LAB — PROTIME-INR
INR: 1.5 — ABNORMAL HIGH (ref 0.8–1.2)
Prothrombin Time: 19 s — ABNORMAL HIGH (ref 11.4–15.2)

## 2024-01-18 LAB — COMPREHENSIVE METABOLIC PANEL WITH GFR
ALT: 66 U/L — ABNORMAL HIGH (ref 0–44)
AST: 76 U/L — ABNORMAL HIGH (ref 15–41)
Albumin: 4.1 g/dL (ref 3.5–5.0)
Alkaline Phosphatase: 661 U/L — ABNORMAL HIGH (ref 38–126)
Anion gap: 13 (ref 5–15)
BUN: 28 mg/dL — ABNORMAL HIGH (ref 8–23)
CO2: 29 mmol/L (ref 22–32)
Calcium: 10.3 mg/dL (ref 8.9–10.3)
Chloride: 96 mmol/L — ABNORMAL LOW (ref 98–111)
Creatinine, Ser: 1.21 mg/dL — ABNORMAL HIGH (ref 0.44–1.00)
GFR, Estimated: 43 mL/min — ABNORMAL LOW (ref >60)
Glucose, Bld: 173 mg/dL — ABNORMAL HIGH (ref 70–99)
Potassium: 4.2 mmol/L (ref 3.5–5.1)
Sodium: 137 mmol/L (ref 135–145)
Total Bilirubin: 0.4 mg/dL (ref 0.0–1.2)
Total Protein: 7.9 g/dL (ref 6.5–8.1)

## 2024-01-18 LAB — HEPATITIS B SURFACE ANTIGEN: Hepatitis B Surface Ag: NONREACTIVE

## 2024-01-18 LAB — HEPATITIS A ANTIBODY, TOTAL: hep A Total Ab: REACTIVE — AB

## 2024-01-18 LAB — HEPATITIS C ANTIBODY: HCV Ab: NONREACTIVE

## 2024-01-18 LAB — FERRITIN: Ferritin: 180 ng/mL (ref 11–307)

## 2024-01-19 LAB — IGG, IGA, IGM
IgA: 223 mg/dL (ref 64–422)
IgG (Immunoglobin G), Serum: 1656 mg/dL — ABNORMAL HIGH (ref 586–1602)
IgM (Immunoglobulin M), Srm: 214 mg/dL (ref 26–217)

## 2024-01-19 LAB — GAMMA GT: GGT: 1079 U/L — ABNORMAL HIGH (ref 7–50)

## 2024-01-19 LAB — ANA: Anti Nuclear Antibody (ANA): POSITIVE — AB

## 2024-01-19 LAB — HEPATITIS B SURFACE ANTIBODY, QUANTITATIVE: Hep B S AB Quant (Post): <3.5 m[IU]/mL — ABNORMAL LOW

## 2024-01-20 LAB — MITOCHONDRIAL ANTIBODIES: Mitochondrial M2 Ab, IgG: <20 U (ref 0.0–20.0)

## 2024-01-20 LAB — ANTI-SMOOTH MUSCLE ANTIBODY, IGG: F-Actin IgG: 8 U (ref 0–19)

## 2024-01-21 ENCOUNTER — Ambulatory Visit: Payer: Self-pay | Admitting: Gastroenterology

## 2024-01-21 NOTE — Telephone Encounter (Signed)
 Received call from [facility] that pt has been scheduled for CT on 01/25/24 at 1:30 pm.

## 2024-01-22 DIAGNOSIS — M545 Low back pain, unspecified: Secondary | ICD-10-CM | POA: Diagnosis not present

## 2024-01-22 DIAGNOSIS — M109 Gout, unspecified: Secondary | ICD-10-CM | POA: Diagnosis not present

## 2024-01-22 DIAGNOSIS — I739 Peripheral vascular disease, unspecified: Secondary | ICD-10-CM | POA: Diagnosis not present

## 2024-01-22 DIAGNOSIS — N183 Chronic kidney disease, stage 3 unspecified: Secondary | ICD-10-CM | POA: Diagnosis not present

## 2024-01-25 ENCOUNTER — Other Ambulatory Visit: Payer: Self-pay | Admitting: *Deleted

## 2024-01-25 ENCOUNTER — Other Ambulatory Visit (HOSPITAL_COMMUNITY)
Admission: RE | Admit: 2024-01-25 | Discharge: 2024-01-25 | Disposition: A | Discharge status: Home / Self Care | Source: Ambulatory Visit | Attending: Gastroenterology | Admitting: Gastroenterology

## 2024-01-25 DIAGNOSIS — R197 Diarrhea, unspecified: Secondary | ICD-10-CM | POA: Diagnosis not present

## 2024-01-25 DIAGNOSIS — R7989 Other specified abnormal findings of blood chemistry: Secondary | ICD-10-CM

## 2024-01-25 DIAGNOSIS — I7143 Infrarenal abdominal aortic aneurysm, without rupture: Secondary | ICD-10-CM | POA: Diagnosis not present

## 2024-01-25 DIAGNOSIS — K439 Ventral hernia without obstruction or gangrene: Secondary | ICD-10-CM | POA: Diagnosis not present

## 2024-01-25 DIAGNOSIS — R634 Abnormal weight loss: Secondary | ICD-10-CM | POA: Diagnosis not present

## 2024-01-25 DIAGNOSIS — Z01812 Encounter for preprocedural laboratory examination: Secondary | ICD-10-CM | POA: Diagnosis not present

## 2024-01-25 DIAGNOSIS — K5730 Diverticulosis of large intestine without perforation or abscess without bleeding: Secondary | ICD-10-CM | POA: Diagnosis not present

## 2024-01-25 LAB — C DIFFICILE QUICK SCREEN W PCR REFLEX
C Diff antigen: NEGATIVE
C Diff interpretation: NOT DETECTED
C Diff toxin: NEGATIVE

## 2024-01-28 ENCOUNTER — Telehealth: Payer: Self-pay | Admitting: Gastroenterology

## 2024-01-28 ENCOUNTER — Ambulatory Visit: Payer: Self-pay | Admitting: Gastroenterology

## 2024-01-28 LAB — GASTROINTESTINAL PANEL BY PCR, STOOL (REPLACES STOOL CULTURE)

## 2024-01-28 LAB — PANCREATIC ELASTASE, FECAL: Pancreatic Elastase-1, Stool: 147 ug Elast./g — ABNORMAL LOW (ref >200)

## 2024-01-28 MED ORDER — PANCRELIPASE (LIP-PROT-AMYL) 36000-114000 UNITS PO CPEP
ORAL_CAPSULE | ORAL | 11 refills | Status: AC
Start: 2024-01-28 — End: ?

## 2024-01-28 NOTE — Telephone Encounter (Signed)
 Reviewed patient's CT of the abdomen pelvis from [facility].   CT A/P with contrast 01/25/2024: - Normal pancreas, spleen, and adrenal glands. -Mild nodular contour of the liver -Evidence of cholecystectomy -No lymphadenopathy -Colonic diverticula with no bowel obstruction. -No acute findings or malignancy identified on imaging.  Summary of liver serologic workup: IgG 1656 ANA positive AMA negative ASMA negative Bilirubin 0.4 AST 76 ALT 66 Alk phos 661 GGT 1079 INR 1.5 Platelets 295 Sodium 137 Negative for hepatitis A, B, or C (not immune to hepatitis B) Ferritin 180  Given her findings above there are multiple differentials for her elevated liver enzymes.  Differentials include autoimmune hepatitis, PBC (AMA negative), autoimmune-PBC overlap, PSC.  Viral hepatitis has been ruled out.  She is on multiple medications ( unable to exclude DILI) however Dili does not usually present as cholestatic in pattern.  Imaging above without any evidence of HCC and no reported concerns of tumor or malignancy within the gallbladder or bile ducts.  Other potential differentials although less likely at this time include cholangiocarcinoma or hepatocellular carcinoma.  Unable to completely rule out choledocholithiasis or pancreatic head mass although less likely would have been present on CT scan.  Will plan to discuss with the patient for the lab work and imaging including: Antiliver kidney antibody, anti-SLA, PBC  panel checking for anti-- GP 210, anti-S P100, ANCA, A1A, CA 19-9, AFP, anti-- dsDNA.  Also may need to discuss potential for MRI/MRCP versus liver biopsy.   Her advanced age is a risk factor for malignancy.  Jhonny  - Given her labs and CT findings, I recommend office visit within the next 1-2 weeks to discuss her results and further plan with her and her daughter in person.   Charmaine Melia, MSN, APRN, FNP-BC, AGACNP-BC [facility]

## 2024-01-29 LAB — CALPROTECTIN, FECAL: Calprotectin, Fecal: 94 ug/g (ref 0–120)

## 2024-02-05 NOTE — Progress Notes (Unsigned)
 GI Office Note    Referring Provider: Jeanette Comer [name], * Primary Care Physician:  [name], [name], PA-C Primary Gastroenterologist: Tonya HERO.Rourk, MD  Date:  02/06/2024  ID:  [name], DOB 01-24-1933, MRN [id number]  Chief Complaint   Chief Complaint  Patient presents with   Follow-up    Follow up. No diarrhea but having stomach cramps. Thinks Creon in making her dizzy.    History of Present Illness  [name] is a 89 y.o. female with a history of IDA, GERD, dysphagia s/p prior dilations, chronic intermittent via medic easy in the setting of hemorrhoids, adenomatous colon polyps, chronic intermittent diarrhea suspected to be secondary to lactose intolerance, and nonspecific colitis  presenting today with complaint of abdominal cramping and dizziness.  EGD November 2021 with normal examined esophagus s/p dilation, erythematous mucosa in the stomach with biopsy showing mild gastropathy and negative H. pylori.  Normal duodenum at that time.   Colonoscopy March 2022: - Entire colon normal - Internal/external hemorrhoids - Pressure sore to left buttocks - Normal TI, random colonic biopsies taken. - Biopsies consistent with mild acute/subacute nonspecific colitis, negative for features of chronicity.  Differentials include ischemia, drug effect, or infection.  Doctor recommended trial of Entocort 9 mg daily for 1 month and follow-up in the clinic.   In 2021 she presented with some diarrhea for 3 weeks with about 9 bowel movements per day for couple of days that then resolved.  She also noted some rectal bleeding at that time.  She had recently been on antibiotics for UTI.  Follow-up stool studies revealed negative C. difficile and GI pathogen panel.  She was given prescription for Colestid  2 g daily for diarrhea and await colonoscopy.   OV April 2022 with [name].  At this visit she noted her GERD was well-controlled with medication and denied any NSAID use.   Had not been taking oral iron.  Noted pain in her lower abdomen 9 out of 10 in severity and reports some mild pain that started about 6 months prior.  She denied any constipation or diarrhea at that time.  She reportedly was taking Entocort daily which significantly improved her diarrhea and never tried the Colestid .  Admitted to dysuria for 2 weeks.  She was advised labs as well as UA and urine culture.  Given her abdominal pain CT abdomen pelvis was ordered.  She is advised to continue Entocort 9 mg daily for now and plans to discuss this with [name] and consider tapering after the 8 weeks.   Last office visit in July 2022.  Prior plans with [name] were to complete 8 weeks of Entocort and decrease via taper thereafter.  She had a UA and urine culture that was positive for UTI with [name]. coli and recommended treatment.  Recommended proceed with givens capsule given low stable hemoglobin in the 9 range.  Her CT scan in April 2022 revealed no acute findings but moderate amount of gas and stool within the colon.  She underwent agile capsule which made to the cecum and therefore then scheduled for givens capsule in June 2022 which was then rescheduled to July 2022.  During this office visit she reported daily bowel movements which were formed and no diarrhea but was still taking Entocort 3 mg daily at that time.  Noted a hemorrhoid that was bleeding with every bowel movement.  Reflux well-controlled.  Not taking oral iron at the time.  Was last received blood in  October 2021 when her hemoglobin was 6.7 at that time.   Givens capsule July 2022 with several nonbleeding erosions in the pylorus and proximal small bowel with unclear significance.  Several lymphangiectasia's also noted in the distal small bowel without any evidence of mass.  She was advised to continue to avoid NSAIDs and monitor H&H closely.   Most recent labs performed on 12/16/2023 with hemoglobin stable at 12.9, platelets 191, magnesium  1.6,  potassium 3.4, creatinine 1.17, GFR 45   Per referral dated 9/25 patient was referred for elevated liver function test.  Per labs dated 12/19/2023 she had a creatinine of 1.32, alk phos 514, AST 59, ALT 92, sodium 137, albumin  3.5, hemoglobin 13.8, platelets 265.   Last office visit 01/16/24.  Report experiencing severe diarrhea for 3 weeks with black and watery stools, occurring with about 9 episodes per day which had recently decreased about 3 episodes daily.  Had been taking Imodium  3-4 doses per day along with Pepto-Bismol.  Nonspecific colitis diagnosed in 2022 treated with Entocort.  Recently experiencing frequent UTIs with multiple antibiotics.  Appetite is poor and experiencing nausea when attempting to eat.  Taking pantoprazole  once daily for heartburn with recent increase in symptoms.  Reporting weakness to when chest pain.  Experiencing nausea as well. Advised stool studies, elastase, and fecal calprotectin. Use imodium  up to 10 mg daily and CT A/P for eval of weight loss. Use lactaid for lactose exposure. Order labs for eval of elevated LFTs. Pantoprazole  increased to BID.   Labs 01/18/2024: IgG 1656 ANA positive AMA negative ASMA negative Bilirubin 0.4 AST 76 ALT 66 Alk phos 661 GGT 1079 INR 1.5 Platelets 295 Sodium 137 Negative for hepatitis A, B, or C (not immune to hepatitis B) Ferritin 180  CT A/P with contrast 01/25/2024: - Normal pancreas, spleen, and adrenal glands. - Mild nodular contour of the liver - Evidence of cholecystectomy - No lymphadenopathy - Colonic diverticula with no bowel obstruction. - No acute findings or malignancy identified on imaging.  Cdiff negative. GI path positive for norovirus. Elastase low. Fecal cal normal. Creon samples given and prescription sent to pharmacy.    Today:  Discussed the use of AI scribe software for clinical note transcription with the patient, who gave verbal consent to proceed.  Accompanied by her daughter.   She  experiences intermittent abdominal pain described as a 'regular little stomach ache' located in the lower abdomen, sometimes associated with a sensation of needing to have a bowel movement without being able to do so. Her diarrhea has improved significantly with the use of Creon, which she takes three times a day with meals and snacks. Appetite has also improved and no further weight loss. A stool test indicated low enzyme levels. She has been trying to improve her nutritional intake. No jaundice, melena, brbpr, rectal pain. Does note history of hemorrhoids.   She experiences daily episodes of choking with both liquids and solids and takes two pills a day for acid reflux (pantoprazole ), overall reflux fairly well controlled. She describes discomfort in the lower abdomen, with a particularly bad night of aches and pains recently.  She reports experiencing heart fluttering and has a history of atrial fibrillation. Her blood work showed low hepatitis B surface antibody levels indicating she is not immune. Her liver enzymes are elevated, and a recent CT scan indicated potential early cirrhosis. She does not have a gallbladder. No intrahepatic or biliary ductal dilation.   She has rheumatoid arthritis, causing significant joint pain, particularly  in her back and hip, affecting her ability to walk and perform daily activities. She reports pain radiating down her legs and a lack of feeling in her toes. She is unable to bend over to cut her toenails and has difficulty walking due to hip and leg pain.  She has a history of urinary tract infections but has not had one recently. However, she continues to experience bladder leakage.        MELD 3.0: 13 at 01/18/2024 11:26 AM MELD-Na: 13 at 01/18/2024 11:26 AM Calculated from: Serum Creatinine: 1.21 mg/dL at 89/09/7972 88:73 AM Serum Sodium: 137 mmol/L at 01/18/2024 11:26 AM Total Bilirubin: 0.4 mg/dL (Using min of 1 mg/dL) at 89/09/7972 88:73 AM Serum Albumin : 4.1  g/dL (Using max of 3.5 g/dL) at 89/09/7972 88:73 AM INR(ratio): 1.5 at 01/18/2024 11:26 AM Age at listing (hypothetical): 89 years Sex: Female at 01/18/2024 11:26 AM   Wt Readings from Last 6 Encounters:  02/06/24 148 lb 9.6 oz (67.4 kg)  01/16/24 147 lb 12.8 oz (67 kg)  09/04/23 153 lb 10.6 oz (69.7 kg)  04/06/23 160 lb 12.8 oz (72.9 kg)  03/20/23 159 lb 8 oz (72.3 kg)  03/06/23 162 lb (73.5 kg)    Body mass index is 28.08 kg/m.   Current Outpatient Medications  Medication Sig Dispense Refill   acetaminophen  (TYLENOL ) 500 MG tablet Take 500 mg by mouth every 6 (six) hours as needed for headache or moderate pain.     ALPRAZolam  (XANAX ) 0.5 MG tablet Take 0.5 mg by mouth daily as needed for anxiety or sleep.     apixaban  (ELIQUIS ) 5 MG TABS tablet Take 1 tablet (5 mg total) by mouth 2 (two) times daily. 180 tablet 3   ascorbic acid  (VITAMIN C) 500 MG tablet Take 1 tablet (500 mg total) by mouth daily. 30 tablet 2   cyanocobalamin  (VITAMIN B12) 1000 MCG tablet Take 1 tablet (1,000 mcg total) by mouth every other day. 90 tablet 1   diltiazem  (CARDIZEM  CD) 180 MG 24 hr capsule Take 1 capsule (180 mg total) by mouth daily before breakfast. 90 capsule 3   DULoxetine  (CYMBALTA ) 60 MG capsule Take 60 mg by mouth daily.     estradiol  (ESTRACE ) 0.1 MG/GM vaginal cream Discard plastic applicator. Insert a blueberry size amount (approximately 1 gram) of cream on fingertip inside vagina at bedtime every night for 1 week then 2 nights per week for long term use. 30 g 3   fluticasone  (CUTIVATE ) 0.05 % cream Apply 1 application topically 2 (two) times daily as needed (skin tears).     furosemide  (LASIX ) 40 MG tablet Take 1 tablet (40 mg total) by mouth daily. 135 tablet 3   gentamicin (GARAMYCIN) 0.3 % ophthalmic solution 1 drop 2 (two) times daily.     HYDROcodone -acetaminophen  (NORCO/VICODIN) 5-325 MG tablet Take 1 tablet by mouth every 6 (six) hours as needed.     hydrocortisone  (ANUSOL -HC) 2.5 %  rectal cream Place 1 application rectally 2 (two) times daily. 30 g 1   iron polysaccharides (NIFEREX) 150 MG capsule Take 150 mg by mouth.     isosorbide  mononitrate (IMDUR ) 30 MG 24 hr tablet Take 1 tablet (30 mg total) by mouth daily. 90 tablet 3   lipase/protease/amylase (CREON) 36000 UNITS CPEP capsule Take 1 capsule (36,000 Units total) by mouth 3 (three) times daily with meals. May also take 1 capsule (36,000 Units total) as needed (with snacks - up to 4 snacks daily). 200 capsule 11  magnesium  oxide (MAG-OX) 400 (240 Mg) MG tablet Take 1 tablet by mouth daily.     metoprolol  succinate (TOPROL -XL) 50 MG 24 hr tablet Take 1 tablet (50 mg total) by mouth 2 (two) times daily. Take with or immediately following a meal. 180 tablet 3   Multiple Vitamins-Minerals (HAIR/SKIN/NAILS) CAPS Take 1 tablet by mouth daily.     Naphazoline HCl (CLEAR EYES OP) Place 1 drop into both eyes daily as needed (itching).     ondansetron  (ZOFRAN -ODT) 4 MG disintegrating tablet Take 4 mg by mouth every 8 (eight) hours as needed.     pantoprazole  (PROTONIX ) 40 MG tablet TAKE ONE TABLET BY MOUTH TWICE DAILY BEFORE MEALS 60 tablet 5   traMADol  (ULTRAM ) 50 MG tablet Take 1 tablet (50 mg total) by mouth every 6 (six) hours as needed. 90 tablet 3   TRELEGY ELLIPTA 100-62.5-25 MCG/ACT AEPB Take 1 puff by mouth daily.     triamcinolone cream (KENALOG) 0.1 % Apply topically 2 (two) times daily.     zinc  sulfate 220 (50 Zn) MG capsule Take 1 capsule (220 mg total) by mouth daily. 30 capsule 3   allopurinol  (ZYLOPRIM ) 100 MG tablet Take 100 mg by mouth. (Patient not taking: Reported on 01/16/2024)     cyclobenzaprine  (FLEXERIL ) 5 MG tablet Take 5 mg by mouth at bedtime. (Patient not taking: Reported on 01/16/2024)     diltiazem  (TIAZAC ) 120 MG 24 hr capsule Take 1 capsule (120 mg total) by mouth at bedtime. (Patient not taking: Reported on 01/16/2024) 90 capsule 1   nitroGLYCERIN  (NITROSTAT ) 0.4 MG SL tablet DISSOLVE ONE TABLET  UNDER TONGUE EVERY 5 MINUTES UP TO 3 DOSES AS NEEDED FOR CHEST PAIN. (Patient taking differently: PRN) 25 tablet 6   potassium chloride  (KLOR-CON  M) 10 MEQ tablet Take 1 tablet by mouth daily. (Patient not taking: Reported on 12/18/2023)     potassium chloride  (KLOR-CON ) 10 MEQ tablet Take 2 tablets (20 mEq total) by mouth 2 (two) times daily. (Patient taking differently: Take 20 mEq by mouth once.) 120 tablet 6   No current facility-administered medications for this visit.    Past Medical History:  Diagnosis Date   Anxiety    Arthritis    Atrial fibrillation (HCC)    Bursitis    Left shoulder   Cataract    CHF (congestive heart failure) (HCC)    CKD (chronic kidney disease)    stage 3-4   COPD (chronic obstructive pulmonary disease) (HCC)    Coronary atherosclerosis of native coronary artery    a. s/p DES to LCx in 04/2013 b. cath in 11/2015 showing patent stent with 20% prox-LAD and 80% ostial RCA stenosis for which medical management was recommended due to small artery size   Depression    Diastolic heart failure (HCC)    EF 55-60%   Dysphagia, unspecified(787.20)    Dyspnea    Dysrhythmia    Essential hypertension    GERD (gastroesophageal reflux disease)    Hx Schatzki's ring, multiple EGD/ED last 01/06/2004   Gout    Headache    History of anemia    Hyperlipidemia    Internal hemorrhoids without mention of complication    MI (myocardial infarction) (HCC) 2006   Microscopic colitis 2003   Panic disorder without agoraphobia    Paresthesia    Pneumonia 12/2011   PVD (peripheral vascular disease)    S/P colonoscopy 09/27/2001   internal hemorrhoids, desc colon inflam polyp, SB BX-chronic duodenitis, colitis   Sleep  apnea    Thyroid  disease     Past Surgical History:  Procedure Laterality Date   ABDOMINAL HYSTERECTOMY     ABDOMINAL HYSTERECTOMY     AGILE CAPSULE N/A 08/30/2020   Procedure: AGILE CAPSULE;  Surgeon: [name] Tonya HERO, MD;  [facility];  Service:  Endoscopy;  Laterality: N/A;  7:30am   ANTERIOR AND POSTERIOR REPAIR     with resection of vagina   ANTERIOR LAT LUMBAR FUSION N/A 08/01/2016   Procedure: Lumbar Two-Lumbar Five Transpsoas lateral interbody fusion with Lumbar Two-Three lateral plate fixation;  Surgeon: Morene Hicks Ditty, MD;  Location: [facility] OR;  Service: Neurosurgery;  Laterality: N/A;  L2-5 Transpsoas lateral interbody fusion with L2-3 lateral plate fixation   APPENDECTOMY     BACK SURGERY     BIOPSY  07/05/2015   Procedure: BIOPSY;  Surgeon: Tonya HERO Shaaron, MD;  Location: [facility];  Service: Endoscopy;;  gastric polyp biopsy, ascending colon biopsy   BIOPSY  02/16/2020   Procedure: BIOPSY;  Surgeon: [name] Tonya HERO, MD;  [facility]: [facility];  Service: Endoscopy;;   BIOPSY  06/28/2020   Procedure: BIOPSY;  Surgeon: [name] Tonya HERO, MD;  [facility]: [facility];  Service: Endoscopy;;   BLADDER SUSPENSION  11/09/2011   Procedure: TRANSVAGINAL TAPE (TVT) PROCEDURE;  Surgeon: Emery LILLETTE Blaze, MD;  Location: [facility];  Service: Urology;  Laterality: N/A;   bladder tack  06/2010   BREAST LUMPECTOMY  1998   left, benign   CARDIAC CATHETERIZATION     CARDIAC CATHETERIZATION     CARDIAC CATHETERIZATION N/A 12/16/2015   Procedure: Left Heart Cath and Coronary Angiography;  Surgeon: Debby DELENA Sor, MD;  Location: [facility];  Service: Cardiovascular;  Laterality: N/A;   CARDIOVERSION N/A 10/04/2017   Procedure: CARDIOVERSION;  Surgeon: Charls Pearla DELENA, MD;  Location: [facility];  Service: Cardiovascular;  Laterality: N/A;   CARDIOVERSION N/A 01/30/2018   Procedure: CARDIOVERSION;  Surgeon: Charls Pearla DELENA, MD;  [facility]: [facility];  Service: Cardiovascular;  Laterality: N/A;   CARDIOVERSION N/A 11/10/2019   Procedure: CARDIOVERSION;  Surgeon: Delford Maude BROCKS, MD;  Location: [facility] ENDOSCOPY;  Service: Cardiovascular;  Laterality: N/A;   CARPAL TUNNEL RELEASE  1989   left   cataract surgery     CHOLECYSTECTOMY   1998   Cholecystectomy     COLONOSCOPY  03/16/2011   multiple hyperplastic colon polyps, sigmoid diverticulosis, melanosis coli   COLONOSCOPY WITH PROPOFOL  N/A 07/05/2015   RMR:one 5 mm polyp in descending colon   COLONOSCOPY WITH PROPOFOL  N/A 06/28/2020   Procedure: COLONOSCOPY WITH PROPOFOL ;  Surgeon: [name] Tonya HERO, MD;  [facility];  Service: Endoscopy;  Laterality: N/A;  am appt   CORONARY ANGIOGRAPHY N/A 05/16/2018   Procedure: CORONARY ANGIOGRAPHY (CATH LAB);  Surgeon: Claudene Victory ORN, MD;  Location: [facility] INVASIVE CV LAB;  Service: Cardiovascular;  Laterality: N/A;   CORONARY ANGIOPLASTY WITH STENT PLACEMENT  2015   ESOPHAGEAL DILATION N/A 07/05/2015   Procedure: ESOPHAGEAL DILATION;  Surgeon: Tonya HERO Shaaron, MD;  [facility];  Service: Endoscopy;  Laterality: N/A;   ESOPHAGOGASTRODUODENOSCOPY (EGD) WITH PROPOFOL  N/A 07/05/2015   MFM:[id number]   ESOPHAGOGASTRODUODENOSCOPY (EGD) WITH PROPOFOL  N/A 02/16/2020   Procedure: ESOPHAGOGASTRODUODENOSCOPY (EGD) WITH PROPOFOL ;  Surgeon: [name] Tonya HERO, MD;  Normal examined esophagus s/p dilation, erythematous mucosa in the stomach s/p biopsy, normal examined duodenum. Pathology with mild gastropathy, negative for H. Pylori.    GIVENS CAPSULE STUDY N/A  11/10/2020   Procedure: GIVENS CAPSULE STUDY;  Surgeon: [name] Tonya HERO, MD;  [facility];  Service: Endoscopy;  Laterality: N/A;  7:30am   JOINT REPLACEMENT Right 2007   right knee   left hand surgery     LEFT HEART CATHETERIZATION WITH CORONARY ANGIOGRAM N/A 05/14/2013   Procedure: LEFT HEART CATHETERIZATION WITH CORONARY ANGIOGRAM;  Surgeon: Ozell JONETTA Fell, MD;  Location: [facility] CATH LAB;  Service: Cardiovascular;  Laterality: N/A;   left rotator cuff surgery     LUMBAR LAMINECTOMY/DECOMPRESSION MICRODISCECTOMY N/A 10/11/2012   Procedure: LUMBAR LAMINECTOMY/DECOMPRESSION MICRODISCECTOMY 2 LEVELS;  Surgeon: Catalina HERO Stains, MD;  Location: [facility]O [facility];  Service:  Neurosurgery;  Laterality: N/A;  L3-4 L4-5 Laminectomy   LUMBAR WOUND DEBRIDEMENT N/A 09/27/2015   Procedure: Exploration of Lumbar Wound w/ Repair CSF Leak/Lumbar Drain Placement;  Surgeon: Catalina Stains, MD;  Location: [facility] NEURO ORS;  Service: Neurosurgery;  Laterality: N/A;   MALONEY DILATION  03/16/2011   Gastritis. No H.pylori on bx. 50F maloney dilation with disruption of  occult cevical esophageal web   MALONEY DILATION N/A 02/16/2020   Procedure: MALONEY DILATION;  Surgeon: [name] Tonya HERO, MD;  [facility];  Service: Endoscopy;  Laterality: N/A;   NASAL SINUS SURGERY     right knee replacement  2007   right leg benign tumor     SHOULDER SURGERY Left    TEE WITHOUT CARDIOVERSION N/A 10/04/2017   Procedure: TRANSESOPHAGEAL ECHOCARDIOGRAM (TEE) WITH PROPOFOL ;  Surgeon: Charls Pearla LABOR, MD;  Location: [facility];  Service: Cardiovascular;  Laterality: N/A;   TEE WITHOUT CARDIOVERSION N/A 11/10/2019   Procedure: TRANSESOPHAGEAL ECHOCARDIOGRAM (TEE);  Surgeon: Delford Maude BROCKS, MD;  Location: [facility] ENDOSCOPY;  Service: Cardiovascular;  Laterality: N/A;   TONSILLECTOMY     unspecified area, hysterectomy  1972   partial    Family History  Problem Relation Age of Onset   Stroke Mother    Parkinson's disease Father    Coronary artery disease Other        family Hx-sons   Cancer Other    Stroke Other        family Hx   Hypertension Other        family Hx   Diabetes Brother    Heart disease Son        before age 85   Diabetes Son    Stroke Daughter 16   Colon cancer Grandson        diagnosed 40   Inflammatory bowel disease Neg Hx     Allergies as of 02/06/2024 - Review Complete 02/06/2024  Allergen Reaction Noted   Cephalosporins Diarrhea and Nausea Only 08/04/2015   Levaquin [levofloxacin in d5w] Swelling 07/06/2014   Macrodantin [nitrofurantoin macrocrystal] Swelling 07/06/2014   Phenothiazines Anaphylaxis and Hives 08/16/2011   Polysorbate Anaphylaxis 08/16/2011    Prednisone  Shortness Of Breath    Buspirone Itching 07/06/2014   Cardura [doxazosin mesylate] Itching 07/06/2014   Codeine Itching    Acyclovir and related Itching 10/07/2015   Colcrys  [colchicine ] Nausea Only 10/04/2018   Prochlorperazine Other (See Comments) 08/16/2011   Ranexa  [ranolazine ]  04/06/2015   Sulfa  antibiotics Other (See Comments) 02/06/2023   Atorvastatin Hives 08/16/2011   Colestipol  Palpitations 05/03/2020   Ofloxacin Rash    Other Itching and Rash 10/10/2012   Penicillins Other (See Comments) 11/09/2011   Pimozide Hives and Itching 08/16/2011   Zetia  [ezetimibe ] Itching and Rash 10/25/2022    Social History   Socioeconomic History  Marital status: Divorced    Spouse name: Not on file   Number of children: 5   Years of education: Not on file   Highest education level: Not on file  Occupational History   Occupation: retired  Tobacco Use   Smoking status: Former    Current packs/day: 0.00    Average packs/day: 1 pack/day for 64.0 years (64.0 ttl pk-yrs)    Types: Cigarettes    Start date: 12/24/1947    Quit date: 11/17/2001    Years since quitting: 22.2   Smokeless tobacco: Never   Tobacco comments:    Quit smoking in 2003  Vaping Use   Vaping status: Never Used  Substance and Sexual Activity   Alcohol  use: Not Currently   Drug use: No   Sexual activity: Never  Other Topics Concern   Not on file  Social History Narrative   Divorced.   Sister had colon perforation & died from complications in [place], [place]   Social Drivers of Health   Financial Resource Strain: Medium Risk (12/15/2023)   Received from [facility]   Overall Financial Resource Strain (CARDIA)    How hard is it for you to pay for the very basics like food, housing, medical care, and heating?: Somewhat hard  Food Insecurity: No Food Insecurity (12/15/2023)   Received from [facility]   Hunger Vital Sign    Within the past 12 months, you worried that your food would run out before  you got the money to buy more.: Never true    Within the past 12 months, the food you bought just didn't last and you didn't have money to get more.: Never true  Transportation Needs: No Transportation Needs (12/15/2023)   Received from [facility] - Transportation    Lack of Transportation (Medical): No    Lack of Transportation (Non-Medical): No  Physical Activity: Inactive (12/15/2023)   Received from [facility]   Exercise Vital Sign    On average, how many days per week do you engage in moderate to strenuous exercise (like a brisk walk)?: 0 days    On average, how many minutes do you engage in exercise at this level?: 0 min  Stress: Stress Concern Present (12/15/2023)   Received from [facility] - Occupational Stress Questionnaire    Do you feel stress - tense, restless, nervous, or anxious, or unable to sleep at night because your mind is troubled all the time - these days?: Very much  Social Connections: Socially Isolated (12/15/2023)   Received from [facility]   Social Connection and Isolation Panel    In a typical week, how many times do you talk on the phone with family, friends, or neighbors?: More than three times a week    How often do you get together with friends or relatives?: More than three times a week    How often do you attend church or religious services?: Never    Do you belong to any clubs or organizations such as church groups, unions, fraternal or athletic groups, or school groups?: No    How often do you attend meetings of the clubs or organizations you belong to?: Never    Are you married, widowed, divorced, separated, never married, or living with a partner?: Divorced     Review of Systems   Gen: Denies fever, chills, anorexia. Denies fatigue, weakness, weight loss.  CV: + palpitations/fluttering.  Denies chest pain, syncope, peripheral edema, and claudication. Resp: Denies dyspnea at rest,  cough, wheezing, coughing up blood, and pleurisy. GI: See HPI Derm: Denies rash, itching, dry skin Psych: Denies depression, anxiety, memory loss, confusion. No homicidal or suicidal ideation.  Heme: Denies bruising, bleeding, and enlarged lymph nodes. + back and joint pain.   Physical Exam   BP 137/78 (BP Location: Right Arm, Patient Position: Sitting, Cuff Size: Normal)   Pulse 92   Temp 97.6 F (36.4 C) (Temporal)   Ht 5' 1 (1.549 m)   Wt 148 lb 9.6 oz (67.4 kg)   BMI 28.08 kg/m   General:   Alert and oriented. No distress noted. Pleasant and cooperative.  Head:  Normocephalic and atraumatic. Eyes:  Conjuctiva clear without scleral icterus. Mouth:  Oral mucosa pink and moist. Good dentition. No lesions. Heart:  irregular without murmurs appreciated.  Abdomen:  +BS, soft, non-tender and non-distended. No rebound or guarding. No HSM or masses noted. Rectal: deferred Msk:  Symmetrical without gross deformities. Normal posture. Neurologic:  Alert and  oriented x4 Psych:  Alert and cooperative. Normal mood and affect.  Assessment & Plan  EVANGELINA DELANCEY is a 89 y.o. female presenting today for follow-up of diarrhea, weight loss, and abnormal liver serologies.     Cirrhosis of liver with abnormal liver function tests Elevated liver enzymes and alkaline phosphatase with elevated GGT >1000. CT scan indicates possible early cirrhosis with liver inflammation and nodularity but without signs of portal hypertension given no splenomegaly, ascites and normal platetls. Differential for elevated LFTs includes fatty liver, cirrhosis, gallstones, bile duct etiology, malignancy. Previous MRI in May showed mild diffuse intrahepatic biliary dilation without filling defect of the CBD and mildly dilated cystic duct remnant. Potential metabolic disease contributing to liver issues. Need to rule out bile duct obstruction or narrowing and autoimmune and other viral causes of elevated LFT. Given on dilt for  cardiac issues - congestive hepatopathy and dili remain possibilities as well. MELD stable at 13. No HE, jaundice, pruritus at this time.  - Order additional blood work to check for autoimmune or other underlying conditions (AFP, CA 19-9, ceruloplasmin, ALK Ab, ANCA, A1A, alk phos isoenzymes, PBC panel, anti-SLA, anti-dsDNA, CMV) - Order MRI to re-assess liver and bile ducts - Consider endoscopic ultrasound if MRI indicates bile duct narrowing or obstruction (will need clearance from cardiology). - 2g sodium diet discussed - Will need RUQ US  every 6 months along with MELD labs.   Exocrine pancreatic insufficiency with chronic diarrhea (improved) Chronic diarrhea has improved with Creon. Creon is being taken with meals, which is essential for efficacy. Stool test showed low enzyme levels, indicating insufficient enzyme secretion. Creon is not likely causing systemic side effects or heart rate fluctuations. - Continue Creon 2 capsules with meals and 1 with snacks, up to 10 capsules per day as needed based on meal size.  Gastroesophageal reflux disease with globus sensation and dysphagia Experiencing daily choking sensation with meals, affecting both solids and liquids. Taking two pills daily for acid reflux. Symptoms suggestive of dysphagia and globus sensation. Hsitory of needing esophageal dilation. Continue soft foods.  - Consider further evaluation if choking sensation persists. - Not great candidate for sedation and procedures.   Weight loss and lack of appetite (improved) Weight loss and lack of appetite have improved with Creon therapy. Creon likely contributing to better appetite and weight stabilization. Reports eating better and maintaining a regular meal schedule. - Continue current dietary habits and Creon  therapy. - Encourage continued use of nutritional supplements like Boost and PowerAde for additional calories and electrolytes.      Follow up   Follow up 3 months.      Charmaine Melia, MSN, FNP-BC, AGACNP-BC [facility]

## 2024-02-06 ENCOUNTER — Ambulatory Visit: Admitting: Gastroenterology

## 2024-02-06 ENCOUNTER — Encounter: Payer: Self-pay | Admitting: Gastroenterology

## 2024-02-06 ENCOUNTER — Other Ambulatory Visit (HOSPITAL_COMMUNITY)
Admission: RE | Admit: 2024-02-06 | Discharge: 2024-02-06 | Disposition: A | Discharge status: Home / Self Care | Source: Ambulatory Visit | Attending: Gastroenterology | Admitting: Gastroenterology

## 2024-02-06 VITALS — BP 137/78 | HR 92 | Temp 97.6°F | Ht 61.0 in | Wt 148.6 lb

## 2024-02-06 DIAGNOSIS — R7989 Other specified abnormal findings of blood chemistry: Secondary | ICD-10-CM | POA: Diagnosis present

## 2024-02-06 DIAGNOSIS — K8681 Exocrine pancreatic insufficiency: Secondary | ICD-10-CM

## 2024-02-06 DIAGNOSIS — R748 Abnormal levels of other serum enzymes: Secondary | ICD-10-CM | POA: Diagnosis present

## 2024-02-06 DIAGNOSIS — R0989 Other specified symptoms and signs involving the circulatory and respiratory systems: Secondary | ICD-10-CM

## 2024-02-06 DIAGNOSIS — R197 Diarrhea, unspecified: Secondary | ICD-10-CM | POA: Diagnosis not present

## 2024-02-06 DIAGNOSIS — K7460 Unspecified cirrhosis of liver: Secondary | ICD-10-CM | POA: Insufficient documentation

## 2024-02-06 DIAGNOSIS — K219 Gastro-esophageal reflux disease without esophagitis: Secondary | ICD-10-CM | POA: Diagnosis not present

## 2024-02-06 DIAGNOSIS — R1310 Dysphagia, unspecified: Secondary | ICD-10-CM

## 2024-02-06 DIAGNOSIS — R634 Abnormal weight loss: Secondary | ICD-10-CM | POA: Diagnosis not present

## 2024-02-06 LAB — ALKALINE PHOSPHATASE: Alkaline Phosphatase: 183 U/L — ABNORMAL HIGH (ref 38–126)

## 2024-02-06 LAB — MAGNESIUM: Magnesium: 2 mg/dL (ref 1.7–2.4)

## 2024-02-06 MED ORDER — DICYCLOMINE HCL 10 MG PO CAPS: 10.0000 mg | ORAL_CAPSULE | Freq: Every day | ORAL | 0 refills | Status: DC | PRN

## 2024-02-06 NOTE — Patient Instructions (Addendum)
 Please have blood work completed at [facility] or [facility]  We will call you with results once they have been received. Please allow 3-5 business days for review. Locations for [facility] in [place]:              [address], [place]               We will get you scheduled for MRI in the near future to further evaluate your elevated liver enzymes.   Cirrhosis Lifestyle Recommendations:  High-protein diet from a primarily plant-based diet. Avoid red meat.  No raw or undercooked meat, seafood, or shellfish. Low-fat/cholesterol/carbohydrate diet. Limit sodium to no more than 2000 mg/day including everything that you eat and drink. Recommend at least 30 minutes of aerobic and resistance exercise 3 days/week. Limit Tylenol  to 2000 mg daily.   Continue taking your Creon 2 capsules with meals along with snacks, up to 10 capsules/day.  I am giving you some dicyclomine.  You will take one 10 mg tablet as needed for severe abdominal cramping.  Please monitor for any worsening heart rhythm or elevated heart rate, dizziness, confusion, blurry vision, falls, or tremors.  Stop immediately if you notice this.  Follow-up in 3 months, sooner if needed.  I will reach out to you with results once received.  It was a pleasure to see you today. I want to create trusting relationships with patients. If you receive a survey regarding your visit,  I greatly appreciate you taking time to fill this out on paper or through your MyChart. I value your feedback.  Charmaine Melia, MSN, FNP-BC, AGACNP-BC [facility]

## 2024-02-07 LAB — CMV IGM: CMV IgM: <30 [AU]/ml (ref 0.0–29.9)

## 2024-02-07 LAB — CMV ANTIBODY, IGG (EIA): CMV Ab - IgG: >10 U/mL — ABNORMAL HIGH (ref 0.00–0.59)

## 2024-02-07 LAB — CANCER ANTIGEN 19-9: CA 19-9: 44 U/mL — ABNORMAL HIGH (ref 0–35)

## 2024-02-07 LAB — ANTI-DNA ANTIBODY, DOUBLE-STRANDED: ds DNA Ab: <1 [IU]/mL (ref 0–9)

## 2024-02-07 LAB — AFP TUMOR MARKER: AFP, Serum, Tumor Marker: 3.2 ng/mL (ref 0.0–8.7)

## 2024-02-07 LAB — CERULOPLASMIN: Ceruloplasmin: 30.8 mg/dL (ref 19.0–39.0)

## 2024-02-08 LAB — ALPHA-1-ANTITRYPSIN PHENOTYP: A-1 Antitrypsin, Ser: 97 mg/dL — ABNORMAL LOW (ref 101–187)

## 2024-02-08 LAB — SOLUBLE LIVER AG (IGG AB): Anti-SLA, IgG: 2.6 U (ref 0.0–20.0)

## 2024-02-08 LAB — ANTI-MICROSOMAL ANTIBODY LIVER / KIDNEY: LKM1 Ab: 0.6 U (ref 0.0–20.0)

## 2024-02-08 LAB — ANCA TITERS
Atypical P-ANCA titer: <1:20 {titer}
C-ANCA: <1:20 {titer}
P-ANCA: <1:20 {titer}

## 2024-02-10 LAB — MISC LABCORP TEST (SEND OUT): Labcorp test code: 1612

## 2024-02-12 ENCOUNTER — Ambulatory Visit (HOSPITAL_COMMUNITY)

## 2024-02-15 DIAGNOSIS — I2510 Atherosclerotic heart disease of native coronary artery without angina pectoris: Secondary | ICD-10-CM | POA: Diagnosis not present

## 2024-02-15 DIAGNOSIS — M109 Gout, unspecified: Secondary | ICD-10-CM | POA: Diagnosis not present

## 2024-02-18 ENCOUNTER — Ambulatory Visit: Payer: Self-pay | Admitting: Gastroenterology

## 2024-02-19 ENCOUNTER — Other Ambulatory Visit: Payer: Self-pay | Admitting: Gastroenterology

## 2024-02-19 ENCOUNTER — Ambulatory Visit (HOSPITAL_COMMUNITY)
Admission: RE | Admit: 2024-02-19 | Discharge: 2024-02-19 | Disposition: A | Discharge status: Home / Self Care | Source: Ambulatory Visit | Attending: Gastroenterology | Admitting: Gastroenterology

## 2024-02-19 DIAGNOSIS — R748 Abnormal levels of other serum enzymes: Secondary | ICD-10-CM | POA: Insufficient documentation

## 2024-02-19 DIAGNOSIS — R7989 Other specified abnormal findings of blood chemistry: Secondary | ICD-10-CM | POA: Insufficient documentation

## 2024-02-19 DIAGNOSIS — R918 Other nonspecific abnormal finding of lung field: Secondary | ICD-10-CM | POA: Diagnosis not present

## 2024-02-19 DIAGNOSIS — K838 Other specified diseases of biliary tract: Secondary | ICD-10-CM | POA: Diagnosis not present

## 2024-02-19 DIAGNOSIS — K7460 Unspecified cirrhosis of liver: Secondary | ICD-10-CM | POA: Diagnosis not present

## 2024-02-19 DIAGNOSIS — K828 Other specified diseases of gallbladder: Secondary | ICD-10-CM | POA: Diagnosis not present

## 2024-02-19 MED ORDER — GADOBUTROL 1 MMOL/ML IV SOLN
7.0000 mL | Freq: Once | INTRAVENOUS | Status: AC | PRN
  Administered 2024-02-19: 7 mL via INTRAVENOUS

## 2024-02-21 ENCOUNTER — Other Ambulatory Visit (HOSPITAL_COMMUNITY)

## 2024-02-22 ENCOUNTER — Ambulatory Visit: Payer: Self-pay | Admitting: Gastroenterology

## 2024-02-22 LAB — MISC LABCORP TEST (SEND OUT): Labcorp test code: 520192

## 2024-02-26 ENCOUNTER — Other Ambulatory Visit: Payer: Self-pay

## 2024-02-26 NOTE — Progress Notes (Unsigned)
 GI Office Note    Referring Provider: Jeanette Comer [name], * Primary Care Physician:  [name], [name], PA-C Primary Gastroenterologist: Tonya HERO.Rourk, MD  Date:  02/28/2024  ID:  [name], DOB 1932/07/08, MRN [id number]   Chief Complaint   Chief Complaint  Patient presents with   Follow-up    Follow up. No problems    History of Present Illness  [name] is a 89 y.o. female with a history of newly diagnosed cirrhosis, IDA, GERD, dysphagia s/p prior dilations, chronic intermittent via medic easy in the setting of hemorrhoids, adenomatous colon polyps, chronic intermittent diarrhea suspected to be secondary to lactose intolerance, and nonspecific colitis  presenting today with no complaints.   EGD November 2021 with normal examined esophagus s/p dilation, erythematous mucosa in the stomach with biopsy showing mild gastropathy and negative H. pylori.  Normal duodenum at that time.   Colonoscopy March 2022: - Entire colon normal - Internal/external hemorrhoids - Pressure sore to left buttocks - Normal TI, random colonic biopsies taken. - Biopsies consistent with mild acute/subacute nonspecific colitis, negative for features of chronicity.  Differentials include ischemia, drug effect, or infection.  Doctor recommended trial of Entocort 9 mg daily for 1 month and follow-up in the clinic.   In 2021 she presented with some diarrhea for 3 weeks with about 9 bowel movements per day for couple of days that then resolved.  She also noted some rectal bleeding at that time.  She had recently been on antibiotics for UTI.  Follow-up stool studies revealed negative C. difficile and GI pathogen panel.  She was given prescription for Colestid  2 g daily for diarrhea and await colonoscopy.   OV April 2022 with [facility].  At this visit she noted her GERD was well-controlled with medication and denied any NSAID use.  Had not been taking oral iron.  Noted pain in her lower abdomen  9 out of 10 in severity and reports some mild pain that started about 6 months prior.  She denied any constipation or diarrhea at that time.  She reportedly was taking Entocort daily which significantly improved her diarrhea and never tried the Colestid .  Admitted to dysuria for 2 weeks.  She was advised labs as well as UA and urine culture.  Given her abdominal pain CT abdomen pelvis was ordered.  She is advised to continue Entocort 9 mg daily for now and plans to discuss this with Dr. Shaaron and consider tapering after the 8 weeks.   OV in July 2022.  Prior plans with Dr. Darlean were to complete 8 weeks of Entocort and decrease via taper thereafter.  She had a UA and urine culture that was positive for UTI with [name]. coli and recommended treatment.  Recommended proceed with givens capsule given low stable hemoglobin in the 9 range.  Her CT scan in April 2022 revealed no acute findings but moderate amount of gas and stool within the colon.  She underwent agile capsule which made to the cecum and therefore then scheduled for givens capsule in June 2022 which was then rescheduled to July 2022.  During this office visit she reported daily bowel movements which were formed and no diarrhea but was still taking Entocort 3 mg daily at that time.  Noted a hemorrhoid that was bleeding with every bowel movement.  Reflux well-controlled.  Not taking oral iron at the time.  Was last received blood in October 2021 when her hemoglobin was 6.7 at that time.  Givens capsule July 2022 with several nonbleeding erosions in the pylorus and proximal small bowel with unclear significance.  Several lymphangiectasia's also noted in the distal small bowel without any evidence of mass.  She was advised to continue to avoid NSAIDs and monitor H&H closely.   Most recent labs performed on 12/16/2023 with hemoglobin stable at 12.9, platelets 191, magnesium  1.6, potassium 3.4, creatinine 1.17, GFR 45   Per referral dated 9/25 patient was  referred for elevated liver function test.  Per labs dated 12/19/2023 she had a creatinine of 1.32, alk phos 514, AST 59, ALT 92, sodium 137, albumin  3.5, hemoglobin 13.8, platelets 265.    OV 01/16/24.  Report experiencing severe diarrhea for 3 weeks with black and watery stools, occurring with about 9 episodes per day which had recently decreased about 3 episodes daily.  Had been taking Imodium  3-4 doses per day along with Pepto-Bismol.  Nonspecific colitis diagnosed in 2022 treated with Entocort.  Recently experiencing frequent UTIs with multiple antibiotics.  Appetite is poor and experiencing nausea when attempting to eat.  Taking pantoprazole  once daily for heartburn with recent increase in symptoms.  Reporting weakness to when chest pain.  Experiencing nausea as well. Advised stool studies, elastase, and fecal calprotectin. Use imodium  up to 10 mg daily and CT A/P for eval of weight loss. Use lactaid for lactose exposure. Order labs for eval of elevated LFTs. Pantoprazole  increased to BID.    Labs 01/18/2024: IgG 1656 ANA positive AMA negative ASMA negative Bilirubin 0.4 AST 76 ALT 66 Alk phos 661 GGT 1079 INR 1.5 Platelets 295 Sodium 137 Negative for hepatitis A, B, or C (not immune to hepatitis B) Ferritin 180   CT A/P with contrast 01/25/2024: - Normal pancreas, spleen, and adrenal glands. - Mild nodular contour of the liver - Evidence of cholecystectomy - No lymphadenopathy - Colonic diverticula with no bowel obstruction. - No acute findings or malignancy identified on imaging.   Cdiff negative. GI path positive for norovirus. Elastase low. Fecal cal normal. Creon samples given and prescription sent to pharmacy.    Last office visit 02/06/2024.  She reported intermittent abdominal pain described as a stomach ache in the lower abdomen associated with sensation of needing to defecate.  Diarrhea significant improved with Creon.  Trying to improve her nutritional intake.  Denies any  jaundice, melena, BRBPR or rectal pain.  Having daily episodes of choking with liquids and solids and takes 2 pills a day for acid reflux but overall feels like the reflux symptoms are well-controlled.  Describing discomfort in the lower abdomen and experiencing heart fluttering and notes a history of A-fib.  Reports significant joint pain in her back and hips.  Advise continue nutritional supplements, advised further evaluation of globus sensation and dysphagia and choking sensation persist.  Would likely start with the PE given she is not a great candidate for sedation and procedures.  Continue Creon.  Discussed need for monitoring of ultrasound and labs given cirrhosis.  MRI ordered, advised possible EUS pending MRI findings.  Check additional autoimmune serologies including evaluation for PBC.  PBC panel was positive for ANA, AMA antibody less than 1: 20 (normal) with AMA M2 antibody equivocal at 20, anti-GP 210 and anti- sp-100 negative, and ASMA is 1: 40 and her ANA has a speckled pattern.   MR abdomen w/ MRCP 02/19/2024: - Noncirrhotic configuration - Mild intrahepatic bile duct dilation - Dilation of extrahepatic bile duct measuring up to 10 mm in the proximal portion and  8-9 mm in the mid/distal portion.  May represent ampullary stenosis. - No discrete obstructing mass or choledocholithiasis - Evidence of cholecystectomy - Slightly dilated and tortuous cystic duct remnant  Elevated IgG with positive ANA, low positive ASMA and equivocal AMA M2 antibody could mean overlap PBC-AIH. MRI indicates no cirrhotic configuration.  Sent my chart message indicating that given her lab work we will consider liver biopsy to further delineate cause of elevated alkaline phosphatase or treat as possible PBC with ursodiol in the setting of cholestasis given chronic intra and extrahepatic biliary ductal dilation however there could be some mild stricture present given the dilated and tortuous cystic duct remnant.   Advised that we could discuss this at upcoming office visit.  Today:  Discussed the use of AI scribe software for clinical note transcription with the patient, who gave verbal consent to proceed.  She has experienced significant improvement in her gastrointestinal symptoms, with a notable decrease in abdominal pain and resolution of diarrhea. Creon has been effective in normalizing her bowel movements, resulting in more formed and regular stools, which she prefers over the previous liquid consistency.  Her appetite has improved, and she is eating better, though not excessively. She stayed with her daughter for six weeks, which helped her eat more regularly, including having ice cream at night. She feels her nerves have improved, with less shaking than before. She has returned to her own home but finds cleaning challenging due to low energy levels.  She has a history of arthritis affecting her arms and hands, causing swelling and requiring movement to manage symptoms. She manages her arthritis with warm water  soaks and gentle movement.  Her liver function has been monitored due to chronic elevation in alkaline phosphatase. Levels were significantly elevated in early October but have since decreased, though they remain elevated. She has a history of fatty liver and no longer has a gallbladder, with a remnant cystic duct and ampullary stenosis noted in her most recent imaging. We discussed options like liver biopsy or trial of ursodiol. She is without any concerning signs/symptoms at this time.   She is currently taking Creon with meals and a fluid pill in the morning. She also consumes Boost for energy and satisfaction without causing gastrointestinal upset. She reports occasional dizziness, which she attributes to changes in blood pressure or quick movements. Her blood pressure has been stable, and recent blood work showed normal iron, B12, and potassium levels.      MELD 3.0: 13 at 01/18/2024 11:26  AM MELD-Na: 13 at 01/18/2024 11:26 AM Calculated from: Serum Creatinine: 1.21 mg/dL at 89/09/7972 88:73 AM Serum Sodium: 137 mmol/L at 01/18/2024 11:26 AM Total Bilirubin: 0.4 mg/dL (Using min of 1 mg/dL) at 89/09/7972 88:73 AM Serum Albumin : 4.1 g/dL (Using max of 3.5 g/dL) at 89/09/7972 88:73 AM INR(ratio): 1.5 at 01/18/2024 11:26 AM Age at listing (hypothetical): 89 years Sex: Female at 01/18/2024 11:26 AM      Latest Ref Rng & Units 02/27/2024    2:07 PM 02/06/2024   12:09 PM 01/18/2024   11:26 AM  CMP  Glucose 70 - 99 mg/dL 833   826   BUN 8 - 23 mg/dL 28   28   Creatinine 9.55 - 1.00 mg/dL 8.68   8.78   Sodium 864 - 145 mmol/L 137   137   Potassium 3.5 - 5.1 mmol/L 4.3   4.2   Chloride 98 - 111 mmol/L 98   96   CO2 22 - 32 mmol/L  25   29   Calcium  8.9 - 10.3 mg/dL 9.7   89.6   Total Protein 6.5 - 8.1 g/dL   7.9   Total Bilirubin 0.0 - 1.2 mg/dL   0.4   Alkaline Phos 38 - 126 U/L  183  661   AST 15 - 41 U/L   76   ALT 0 - 44 U/L   66      Wt Readings from Last 6 Encounters:  02/28/24 151 lb 9.6 oz (68.8 kg)  02/06/24 148 lb 9.6 oz (67.4 kg)  01/16/24 147 lb 12.8 oz (67 kg)  09/04/23 153 lb 10.6 oz (69.7 kg)  04/06/23 160 lb 12.8 oz (72.9 kg)  03/20/23 159 lb 8 oz (72.3 kg)    Body mass index is 28.64 kg/m.   Current Outpatient Medications  Medication Sig Dispense Refill   acetaminophen  (TYLENOL ) 500 MG tablet Take 500 mg by mouth every 6 (six) hours as needed for headache or moderate pain.     ALPRAZolam  (XANAX ) 0.5 MG tablet Take 0.5 mg by mouth daily as needed for anxiety or sleep.     apixaban  (ELIQUIS ) 5 MG TABS tablet Take 1 tablet (5 mg total) by mouth 2 (two) times daily. 180 tablet 3   ascorbic acid  (VITAMIN C) 500 MG tablet Take 1 tablet (500 mg total) by mouth daily. 30 tablet 2   cyanocobalamin  (VITAMIN B12) 1000 MCG tablet Take 1 tablet (1,000 mcg total) by mouth every other day. 90 tablet 1   dicyclomine (BENTYL) 10 MG capsule Take 1 capsule (10 mg  total) by mouth daily as needed for spasms (Severe abdominal cramping). 30 capsule 0   diltiazem  (CARDIZEM  CD) 180 MG 24 hr capsule Take 1 capsule (180 mg total) by mouth daily before breakfast. 90 capsule 3   DULoxetine  (CYMBALTA ) 60 MG capsule Take 60 mg by mouth daily.     estradiol  (ESTRACE ) 0.1 MG/GM vaginal cream Discard plastic applicator. Insert a blueberry size amount (approximately 1 gram) of cream on fingertip inside vagina at bedtime every night for 1 week then 2 nights per week for long term use. 30 g 3   fluticasone  (CUTIVATE ) 0.05 % cream Apply 1 application topically 2 (two) times daily as needed (skin tears).     furosemide  (LASIX ) 40 MG tablet Take 1 tablet (40 mg total) by mouth daily. 135 tablet 3   gentamicin (GARAMYCIN) 0.3 % ophthalmic solution 1 drop 2 (two) times daily.     HYDROcodone -acetaminophen  (NORCO/VICODIN) 5-325 MG tablet Take 1 tablet by mouth every 6 (six) hours as needed.     hydrocortisone  (ANUSOL -HC) 2.5 % rectal cream Place 1 application rectally 2 (two) times daily. 30 g 1   iron polysaccharides (NIFEREX) 150 MG capsule Take 150 mg by mouth.     isosorbide  mononitrate (IMDUR ) 30 MG 24 hr tablet Take 1 tablet (30 mg total) by mouth daily. 90 tablet 3   lipase/protease/amylase (CREON) 36000 UNITS CPEP capsule Take 1 capsule (36,000 Units total) by mouth 3 (three) times daily with meals. May also take 1 capsule (36,000 Units total) as needed (with snacks - up to 4 snacks daily). 200 capsule 11   magnesium  oxide (MAG-OX) 400 (240 Mg) MG tablet Take 1 tablet by mouth daily.     metoprolol  succinate (TOPROL -XL) 50 MG 24 hr tablet Take 1 tablet (50 mg total) by mouth 2 (two) times daily. Take with or immediately following a meal. 180 tablet 3   Multiple Vitamins-Minerals (HAIR/SKIN/NAILS) CAPS  Take 1 tablet by mouth daily.     Naphazoline HCl (CLEAR EYES OP) Place 1 drop into both eyes daily as needed (itching).     nitroGLYCERIN  (NITROSTAT ) 0.4 MG SL tablet DISSOLVE  ONE TABLET UNDER TONGUE EVERY 5 MINUTES UP TO 3 DOSES AS NEEDED FOR CHEST PAIN. 25 tablet 6   ondansetron  (ZOFRAN -ODT) 4 MG disintegrating tablet Take 4 mg by mouth every 8 (eight) hours as needed.     pantoprazole  (PROTONIX ) 40 MG tablet TAKE ONE TABLET BY MOUTH TWICE DAILY BEFORE MEALS 60 tablet 5   traMADol  (ULTRAM ) 50 MG tablet Take 1 tablet (50 mg total) by mouth every 6 (six) hours as needed. 90 tablet 3   TRELEGY ELLIPTA 100-62.5-25 MCG/ACT AEPB Take 1 puff by mouth daily.     triamcinolone cream (KENALOG) 0.1 % Apply topically 2 (two) times daily.     zinc  sulfate 220 (50 Zn) MG capsule Take 1 capsule (220 mg total) by mouth daily. 30 capsule 3   cyclobenzaprine  (FLEXERIL ) 5 MG tablet Take 5 mg by mouth at bedtime. (Patient not taking: Reported on 01/16/2024)     diltiazem  (TIAZAC ) 120 MG 24 hr capsule Take 1 capsule (120 mg total) by mouth at bedtime. (Patient not taking: Reported on 01/16/2024) 90 capsule 1   potassium chloride  (KLOR-CON  M) 10 MEQ tablet Take 1 tablet by mouth daily. (Patient not taking: Reported on 02/28/2024)     potassium chloride  (KLOR-CON ) 10 MEQ tablet Take 2 tablets (20 mEq total) by mouth 2 (two) times daily. (Patient taking differently: Take 20 mEq by mouth once.) 120 tablet 6   No current facility-administered medications for this visit.    Past Medical History:  Diagnosis Date   Anxiety    Arthritis    Atrial fibrillation (HCC)    Bursitis    Left shoulder   Cataract    CHF (congestive heart failure) (HCC)    CKD (chronic kidney disease)    stage 3-4   COPD (chronic obstructive pulmonary disease) (HCC)    Coronary atherosclerosis of native coronary artery    a. s/p DES to LCx in 04/2013 b. cath in 11/2015 showing patent stent with 20% prox-LAD and 80% ostial RCA stenosis for which medical management was recommended due to small artery size   Depression    Diastolic heart failure (HCC)    EF 55-60%   Dysphagia, unspecified(787.20)    Dyspnea     Dysrhythmia    Essential hypertension    GERD (gastroesophageal reflux disease)    Hx Schatzki's ring, multiple EGD/ED last 01/06/2004   Gout    Headache    History of anemia    Hyperlipidemia    Internal hemorrhoids without mention of complication    MI (myocardial infarction) (HCC) 2006   Microscopic colitis 2003   Panic disorder without agoraphobia    Paresthesia    Pneumonia 12/2011   PVD (peripheral vascular disease)    S/P colonoscopy 09/27/2001   internal hemorrhoids, desc colon inflam polyp, SB BX-chronic duodenitis, colitis   Sleep apnea    Thyroid  disease     Past Surgical History:  Procedure Laterality Date   ABDOMINAL HYSTERECTOMY     ABDOMINAL HYSTERECTOMY     AGILE CAPSULE N/A 08/30/2020   Procedure: AGILE CAPSULE;  Surgeon: Shaaron Tonya HERO, MD;  Location: [facility];  Service: Endoscopy;  Laterality: N/A;  7:30am   ANTERIOR AND POSTERIOR REPAIR     with resection of vagina   ANTERIOR LAT  LUMBAR FUSION N/A 08/01/2016   Procedure: Lumbar Two-Lumbar Five Transpsoas lateral interbody fusion with Lumbar Two-Three lateral plate fixation;  Surgeon: Morene Hicks Ditty, MD;  Location: [facility] OR;  Service: Neurosurgery;  Laterality: N/A;  L2-5 Transpsoas lateral interbody fusion with L2-3 lateral plate fixation   APPENDECTOMY     BACK SURGERY     BIOPSY  07/05/2015   Procedure: BIOPSY;  Surgeon: Tonya CHRISTELLA Hollingshead, MD;  Location: [facility];  Service: Endoscopy;;  gastric polyp biopsy, ascending colon biopsy   BIOPSY  02/16/2020   Procedure: BIOPSY;  Surgeon: Hollingshead Tonya CHRISTELLA, MD;  Location: [facility];  Service: Endoscopy;;   BIOPSY  06/28/2020   Procedure: BIOPSY;  Surgeon: Hollingshead Tonya CHRISTELLA, MD;  Location: [facility];  Service: Endoscopy;;   BLADDER SUSPENSION  11/09/2011   Procedure: TRANSVAGINAL TAPE (TVT) PROCEDURE;  Surgeon: Emery LILLETTE Blaze, MD;  Location: [facility];  Service: Urology;  Laterality: N/A;   bladder tack  06/2010   BREAST LUMPECTOMY  1998   left, benign    CARDIAC CATHETERIZATION     CARDIAC CATHETERIZATION     CARDIAC CATHETERIZATION N/A 12/16/2015   Procedure: Left Heart Cath and Coronary Angiography;  Surgeon: Debby DELENA Sor, MD;  Location: [facility];  Service: Cardiovascular;  Laterality: N/A;   CARDIOVERSION N/A 10/04/2017   Procedure: CARDIOVERSION;  Surgeon: Charls Pearla DELENA, MD;  Location: [facility];  Service: Cardiovascular;  Laterality: N/A;   CARDIOVERSION N/A 01/30/2018   Procedure: CARDIOVERSION;  Surgeon: Charls Pearla DELENA, MD;  [facility]: [facility];  Service: Cardiovascular;  Laterality: N/A;   CARDIOVERSION N/A 11/10/2019   Procedure: CARDIOVERSION;  Surgeon: Delford Maude BROCKS, MD;  Location: [facility] ENDOSCOPY;  Service: Cardiovascular;  Laterality: N/A;   CARPAL TUNNEL RELEASE  1989   left   cataract surgery     CHOLECYSTECTOMY  1998   Cholecystectomy     COLONOSCOPY  03/16/2011   multiple hyperplastic colon polyps, sigmoid diverticulosis, melanosis coli   COLONOSCOPY WITH PROPOFOL  N/A 07/05/2015   RMR:one 5 mm polyp in descending colon   COLONOSCOPY WITH PROPOFOL  N/A 06/28/2020   Procedure: COLONOSCOPY WITH PROPOFOL ;  Surgeon: Hollingshead Tonya CHRISTELLA, MD;  [facility];  Service: Endoscopy;  Laterality: N/A;  am appt   CORONARY ANGIOGRAPHY N/A 05/16/2018   Procedure: CORONARY ANGIOGRAPHY (CATH LAB);  Surgeon: Claudene Victory ORN, MD;  Location: [facility] INVASIVE CV LAB;  Service: Cardiovascular;  Laterality: N/A;   CORONARY ANGIOPLASTY WITH STENT PLACEMENT  2015   ESOPHAGEAL DILATION N/A 07/05/2015   Procedure: ESOPHAGEAL DILATION;  Surgeon: Tonya CHRISTELLA Hollingshead, MD;  [facility];  Service: Endoscopy;  Laterality: N/A;   ESOPHAGOGASTRODUODENOSCOPY (EGD) WITH PROPOFOL  N/A 07/05/2015   MFM:[id number]   ESOPHAGOGASTRODUODENOSCOPY (EGD) WITH PROPOFOL  N/A 02/16/2020   Procedure: ESOPHAGOGASTRODUODENOSCOPY (EGD) WITH PROPOFOL ;  Surgeon: Hollingshead Tonya CHRISTELLA, MD;  Normal examined esophagus s/p dilation, erythematous mucosa in the stomach  s/p biopsy, normal examined duodenum. Pathology with mild gastropathy, negative for H. Pylori.    GIVENS CAPSULE STUDY N/A 11/10/2020   Procedure: GIVENS CAPSULE STUDY;  Surgeon: Hollingshead Tonya CHRISTELLA, MD;  [facility]: [facility];  Service: Endoscopy;  Laterality: N/A;  7:30am   JOINT REPLACEMENT Right 2007   right knee   left hand surgery     LEFT HEART CATHETERIZATION WITH CORONARY ANGIOGRAM N/A 05/14/2013   Procedure: LEFT HEART CATHETERIZATION WITH CORONARY ANGIOGRAM;  Surgeon: Ozell JONETTA Fell, MD;  Location: [facility] CATH LAB;  Service: Cardiovascular;  Laterality: N/A;   left rotator cuff surgery     LUMBAR LAMINECTOMY/DECOMPRESSION MICRODISCECTOMY N/A 10/11/2012   Procedure: LUMBAR LAMINECTOMY/DECOMPRESSION MICRODISCECTOMY 2 LEVELS;  Surgeon: Catalina CHRISTELLA Stains, MD;  Location: [facility];  Service: Neurosurgery;  Laterality: N/A;  L3-4 L4-5 Laminectomy   LUMBAR WOUND DEBRIDEMENT N/A 09/27/2015   Procedure: Exploration of Lumbar Wound w/ Repair CSF Leak/Lumbar Drain Placement;  Surgeon: Catalina Stains, MD;  Location: [facility];  Service: Neurosurgery;  Laterality: N/A;   MALONEY DILATION  03/16/2011   Gastritis. No H.pylori on bx. 7F maloney dilation with disruption of  occult cevical esophageal web   MALONEY DILATION N/A 02/16/2020   Procedure: MALONEY DILATION;  Surgeon: Shaaron Tonya CHRISTELLA, MD;  [facility]: [facility];  Service: Endoscopy;  Laterality: N/A;   NASAL SINUS SURGERY     right knee replacement  2007   right leg benign tumor     SHOULDER SURGERY Left    TEE WITHOUT CARDIOVERSION N/A 10/04/2017   Procedure: TRANSESOPHAGEAL ECHOCARDIOGRAM (TEE) WITH PROPOFOL ;  Surgeon: Charls Pearla LABOR, MD;  Location: [facility];  Service: Cardiovascular;  Laterality: N/A;   TEE WITHOUT CARDIOVERSION N/A 11/10/2019   Procedure: TRANSESOPHAGEAL ECHOCARDIOGRAM (TEE);  Surgeon: Delford Maude BROCKS, MD;  Location: [facility] ENDOSCOPY;  Service: Cardiovascular;  Laterality: N/A;   TONSILLECTOMY     unspecified area,  hysterectomy  1972   partial    Family History  Problem Relation Age of Onset   Stroke Mother    Parkinson's disease Father    Coronary artery disease Other        family Hx-sons   Cancer Other    Stroke Other        family Hx   Hypertension Other        family Hx   Diabetes Brother    Heart disease Son        before age 88   Diabetes Son    Stroke Daughter 55   Colon cancer Grandson        diagnosed 40   Inflammatory bowel disease Neg Hx     Allergies as of 02/28/2024 - Review Complete 02/28/2024  Allergen Reaction Noted   Cephalosporins Diarrhea and Nausea Only 08/04/2015   Levaquin [levofloxacin in d5w] Swelling 07/06/2014   Macrodantin [nitrofurantoin macrocrystal] Swelling 07/06/2014   Phenothiazines Anaphylaxis and Hives 08/16/2011   Polysorbate Anaphylaxis 08/16/2011   Prednisone  Shortness Of Breath    Buspirone Itching 07/06/2014   Cardura [doxazosin mesylate] Itching 07/06/2014   Codeine Itching    Acyclovir and related Itching 10/07/2015   Colcrys  [colchicine ] Nausea Only 10/04/2018   Prochlorperazine Other (See Comments) 08/16/2011   Ranexa  [ranolazine ]  04/06/2015   Sulfa  antibiotics Other (See Comments) 02/06/2023   Atorvastatin Hives 08/16/2011   Colestipol  Palpitations 05/03/2020   Ofloxacin Rash    Other Itching and Rash 10/10/2012   Penicillins Other (See Comments) 11/09/2011   Pimozide Hives and Itching 08/16/2011   Zetia  [ezetimibe ] Itching and Rash 10/25/2022    Social History   Socioeconomic History   Marital status: Divorced    Spouse name: Not on file   Number of children: 5   Years of education: Not on file   Highest education level: Not on file  Occupational History   Occupation: retired  Tobacco Use   Smoking status: Former    Current packs/day: 0.00    Average packs/day: 1 pack/day for 64.0 years (64.0 ttl pk-yrs)    Types: Cigarettes    Start date: 12/24/1947  Quit date: 11/17/2001    Years since quitting: 22.2   Smokeless  tobacco: Never   Tobacco comments:    Quit smoking in 2003  Vaping Use   Vaping status: Never Used  Substance and Sexual Activity   Alcohol  use: Not Currently   Drug use: No   Sexual activity: Never  Other Topics Concern   Not on file  Social History Narrative   Divorced.   Sister had colon perforation & died from complications in [place], [place]   Social Drivers of Health   Financial Resource Strain: Medium Risk (12/15/2023)   Received from [facility]   Overall Financial Resource Strain (CARDIA)    How hard is it for you to pay for the very basics like food, housing, medical care, and heating?: Somewhat hard  Food Insecurity: No Food Insecurity (12/15/2023)   Received from [facility]   Hunger Vital Sign    Within the past 12 months, you worried that your food would run out before you got the money to buy more.: Never true    Within the past 12 months, the food you bought just didn't last and you didn't have money to get more.: Never true  Transportation Needs: No Transportation Needs (12/15/2023)   Received from [facility] - Transportation    Lack of Transportation (Medical): No    Lack of Transportation (Non-Medical): No  Physical Activity: Inactive (12/15/2023)   Received from [facility]   Exercise Vital Sign    On average, how many days per week do you engage in moderate to strenuous exercise (like a brisk walk)?: 0 days    On average, how many minutes do you engage in exercise at this level?: 0 min  Stress: Stress Concern Present (12/15/2023)   Received from [facility] - Occupational Stress Questionnaire    Do you feel stress - tense, restless, nervous, or anxious, or unable to sleep at night because your mind is troubled all the time - these days?: Very much  Social Connections: Socially Isolated (12/15/2023)   Received from [facility]   Social Connection and Isolation Panel    In a typical week,  how many times do you talk on the phone with family, friends, or neighbors?: More than three times a week    How often do you get together with friends or relatives?: More than three times a week    How often do you attend church or religious services?: Never    Do you belong to any clubs or organizations such as church groups, unions, fraternal or athletic groups, or school groups?: No    How often do you attend meetings of the clubs or organizations you belong to?: Never    Are you married, widowed, divorced, separated, never married, or living with a partner?: Divorced    Review of Systems   Gen: Denies fever, chills, anorexia. Denies fatigue, weakness, weight loss.  CV: Denies chest pain, palpitations, syncope, peripheral edema, and claudication. Resp: Denies dyspnea at rest, cough, wheezing, coughing up blood, and pleurisy. GI: See HPI Derm: Denies rash, itching, dry skin Psych: Denies depression, anxiety, memory loss, confusion. No homicidal or suicidal ideation.  Heme: Denies bruising, bleeding, and enlarged lymph nodes.  Physical Exam   BP 132/78 (BP Location: Right Arm, Patient Position: Sitting, Cuff Size: Normal)   Pulse 84   Temp 97.8 F (36.6 C) (Temporal)   Ht  5' 1 (1.549 m)   Wt 151 lb 9.6 oz (68.8 kg)   BMI 28.64 kg/m   General:   Alert and oriented. No distress noted. Pleasant and cooperative.  Head:  Normocephalic and atraumatic. Eyes:  Conjuctiva clear without scleral icterus. Mouth:  Oral mucosa pink and moist. Good dentition. No lesions. Abdomen:  +BS, soft, non-tender and non-distended. No rebound or guarding. No HSM or masses noted. Rectal: deferred Msk:  Symmetrical without gross deformities. Normal posture. Extremities:  Without edema. Neurologic:  Alert and  oriented x4 Psych:  Alert and cooperative. Normal mood and affect.  Assessment & Plan  [name] is a 89 y.o. female presenting today with no complaints.     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 Differential includes hepatic steatosis, chronic cholestasis versus primary biliary cholangitis (PBC). MRI does not show cirrhosis, but fatty liver is present. CT in October at OSH indicated likely cirrhosis (mildly nodular contour). After discussion with the patient and her daughter regarding treatment options including ursodiol and further evaluation with possible liver biopsy she decliend for now given she is finally feeling much better than she has been and does not want to take the risks of side effects from medication or the risk of performing a procedure. She would like to continue to monitor for now. - Will recheck liver function tests in three months - isoenzymes and HFP - Will consider/rediscuss ursodiol if alkaline phosphatase levels increase. - Low fat diet  Exocrine pancreatic insufficiency with prior weight loss and lack of appetite Improvement in symptoms with Creon, including reduced diarrhea and improved bowel movements. Appetite has improved, and she is consuming more food and fluids, including protein shakes and Boost. - Continue Creon 2 with meals and 1 with snacks, max 10/day as prescribed. - Ensure adequate supply of Creon and notify if refills are needed.  Fatty liver disease, concern for Cirrhosis Fatty liver disease present, but recent MRI does not show cirrhosis. Prior Ct with evidence of cirrhosis. Monitoring is necessary due to chronic elevation of liver enzymes. Normal spleen on imaging and no thrombocytopenia. - Continue monitoring liver function tests.  GERD Maintained on pantoprazole  40 mg BID.   Iron deficiency anemia, in remission Iron levels are normal, and hemoglobin levels are stable. No recent  blood loss noted. History of rectal bleeding and B12 deficiency. EGD and Colonoscopy last in 2021 and 2022. Capsule completed without overt findings. Holding off on repeat procedures given stable labs and high risk for procedures as well as patient request.  Recent B 12 level stable.  - Continue monitoring iron and hemoglobin levels, following with hematology.  - Continue iron supplementation and B12 supplementation.      Follow up   Follow up 3 months.     Charmaine Melia, MSN, FNP-BC, AGACNP-BC [facility]

## 2024-02-27 ENCOUNTER — Inpatient Hospital Stay: Attending: Physician Assistant

## 2024-02-27 DIAGNOSIS — N1832 Chronic kidney disease, stage 3b: Secondary | ICD-10-CM | POA: Diagnosis not present

## 2024-02-27 DIAGNOSIS — Z87891 Personal history of nicotine dependence: Secondary | ICD-10-CM | POA: Diagnosis not present

## 2024-02-27 DIAGNOSIS — K644 Residual hemorrhoidal skin tags: Secondary | ICD-10-CM | POA: Insufficient documentation

## 2024-02-27 DIAGNOSIS — E538 Deficiency of other specified B group vitamins: Secondary | ICD-10-CM | POA: Insufficient documentation

## 2024-02-27 DIAGNOSIS — D500 Iron deficiency anemia secondary to blood loss (chronic): Secondary | ICD-10-CM | POA: Diagnosis not present

## 2024-02-27 DIAGNOSIS — Z800 Family history of malignant neoplasm of digestive organs: Secondary | ICD-10-CM | POA: Diagnosis not present

## 2024-02-27 LAB — FERRITIN: Ferritin: 120 ng/mL (ref 11–307)

## 2024-02-27 LAB — CBC WITH DIFFERENTIAL/PLATELET
Abs Immature Granulocytes: 0.06 K/uL (ref 0.00–0.07)
Basophils Absolute: 0.1 K/uL (ref 0.0–0.1)
Basophils Relative: 1 %
Eosinophils Absolute: 0.3 K/uL (ref 0.0–0.5)
Eosinophils Relative: 3 %
HCT: 40.2 % (ref 36.0–46.0)
Hemoglobin: 13.5 g/dL (ref 12.0–15.0)
Immature Granulocytes: 1 %
Lymphocytes Relative: 30 %
Lymphs Abs: 2.8 K/uL (ref 0.7–4.0)
MCH: 30.3 pg (ref 26.0–34.0)
MCHC: 33.6 g/dL (ref 30.0–36.0)
MCV: 90.3 fL (ref 80.0–100.0)
Monocytes Absolute: 0.6 K/uL (ref 0.1–1.0)
Monocytes Relative: 7 %
Neutro Abs: 5.6 K/uL (ref 1.7–7.7)
Neutrophils Relative %: 58 %
Platelets: 295 K/uL (ref 150–400)
RBC: 4.45 MIL/uL (ref 3.87–5.11)
RDW: 14.6 % (ref 11.5–15.5)
WBC: 9.4 K/uL (ref 4.0–10.5)
nRBC: 0 % (ref 0.0–0.2)

## 2024-02-27 LAB — IRON AND TIBC
Iron: 72 ug/dL (ref 28–170)
Saturation Ratios: 21 % (ref 10.4–31.8)
TIBC: 347 ug/dL (ref 250–450)
UIBC: 275 ug/dL

## 2024-02-27 LAB — BASIC METABOLIC PANEL WITH GFR
Anion gap: 14 (ref 5–15)
BUN: 28 mg/dL — ABNORMAL HIGH (ref 8–23)
CO2: 25 mmol/L (ref 22–32)
Calcium: 9.7 mg/dL (ref 8.9–10.3)
Chloride: 98 mmol/L (ref 98–111)
Creatinine, Ser: 1.31 mg/dL — ABNORMAL HIGH (ref 0.44–1.00)
GFR, Estimated: 39 mL/min — ABNORMAL LOW (ref >60)
Glucose, Bld: 166 mg/dL — ABNORMAL HIGH (ref 70–99)
Potassium: 4.3 mmol/L (ref 3.5–5.1)
Sodium: 137 mmol/L (ref 135–145)

## 2024-02-27 LAB — VITAMIN B12: Vitamin B-12: 617 pg/mL (ref 180–914)

## 2024-02-28 ENCOUNTER — Encounter: Payer: Self-pay | Admitting: Gastroenterology

## 2024-02-28 ENCOUNTER — Ambulatory Visit: Admitting: Gastroenterology

## 2024-02-28 VITALS — BP 132/78 | HR 84 | Temp 97.8°F | Ht 61.0 in | Wt 151.6 lb

## 2024-02-28 DIAGNOSIS — K219 Gastro-esophageal reflux disease without esophagitis: Secondary | ICD-10-CM

## 2024-02-28 DIAGNOSIS — R748 Abnormal levels of other serum enzymes: Secondary | ICD-10-CM | POA: Diagnosis not present

## 2024-02-28 DIAGNOSIS — K8681 Exocrine pancreatic insufficiency: Secondary | ICD-10-CM

## 2024-02-28 DIAGNOSIS — R197 Diarrhea, unspecified: Secondary | ICD-10-CM

## 2024-02-28 DIAGNOSIS — D649 Anemia, unspecified: Secondary | ICD-10-CM | POA: Diagnosis not present

## 2024-02-28 DIAGNOSIS — K7460 Unspecified cirrhosis of liver: Secondary | ICD-10-CM | POA: Diagnosis not present

## 2024-02-28 DIAGNOSIS — R7989 Other specified abnormal findings of blood chemistry: Secondary | ICD-10-CM | POA: Diagnosis not present

## 2024-02-28 NOTE — Patient Instructions (Addendum)
 Continue your Creon 2 capsules with meals along with snacks, up to 10 capsules/day.   Please only take the dicyclomine (Bentyl) as needed if you are having any abdominal pain.  Recommendations:  High-protein diet from a primarily plant-based diet. Avoid red meat.  No raw or undercooked meat, seafood, or shellfish. Low-fat/cholesterol/carbohydrate diet. Limit sodium to no more than 2000 mg/day including everything that you eat and drink. Recommend at least 30 minutes of aerobic and resistance exercise 3 days/week. Limit Tylenol  to 2000 mg daily.   Continue to follow with hematology for your iron deficiency anemia and B12 deficiency.  Continue your boost to help maintain weight.  Please let me know if you start having any further weight loss or appetite decreases further.  We will plan to repeat your liver numbers in 3 months and then see you afterward in the office.  It was a pleasure to see you today. I want to create trusting relationships with patients. If you receive a survey regarding your visit,  I greatly appreciate you taking time to fill this out on paper or through your MyChart. I value your feedback.  Charmaine Melia, MSN, FNP-BC, AGACNP-BC [facility]

## 2024-03-04 NOTE — Progress Notes (Unsigned)
 [facility] [address], [place] [address]   CLINIC:  Medical Oncology/Hematology  PCP:  [name], [name], PA-C [address] [place] [address] [phone]   REASON FOR VISIT:  Follow-up for iron deficiency anemia   CURRENT THERAPY: Intermittent IV iron infusions (last given 09/06/2021)  INTERVAL HISTORY:   [name] 89 y.o. female returns for routine follow-up of iron deficiency anemia.  She was last seen by Pleasant Barefoot PA-C on 09/04/2023.   She is accompanied today by her daughter, [name].  In the interim since her last visit, she was hospitalized at [facility] from 12/15/2023 through 12/18/2023 due to diarrhea and dyspnea on exertion. Per chart review, she was also recently diagnosed with fatty liver disease, possible early liver cirrhosis (October/November 2025), following with [facility].  At today's visit, she reports feeling fair.     She reports intermittent rectal bleeding from hemorrhoids, most recently about 2 months ago.   She had some mild nosebleeds during allergy season, but no gushing epistaxis. No melena.   She takes Eliquis  for her A-fib.  She reports ongoing fatigue, dyspnea on exertion, shortness of breath at rest, and intermittent chest pain.      She has intermittent headaches.    She is taking vitamin B12 1000 mcg every other day. She has little to no energy and 100% appetite.   She endorses that she is maintaining a stable weight.  ASSESSMENT & PLAN:  1.  Normocytic anemia, secondary to iron deficiency (GI blood loss) and B12 deficiency - Iron deficiency anemia secondary to GI blood loss from hemorrhoids - She follows with GI, was also evaluated by general surgery but is not a candidate for surgical intervention or banding - She takes Eliquis  in the setting of atrial fibrillation - She has CKD stage IIIb - Colonoscopy (06/28/2020): Internal and external hemorrhoids - EGD (02/16/2020): Normal  esophagus, erythematous mucosa of the stomach, normal duodenum - Given capsule study (July 2022): Several nonbleeding erosions in pylorus and proximal small bowel with unclear significance.  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 - She could not tolerate iron supplement due to constipation and lack of improvement. - Taking vitamin B12 1000 mcg every other day  - Last iron was Feraheme  x 1 on 10/12/2022 - She has ongoing fatigue - Most recent labs (02/27/2024): Hgb 13.5/MCV 90.3 Ferritin 120, iron saturation 21%.   Baseline CKD stage IIIb with creatinine 1.31/GFR 39. Vitamin B12 is 617, normalized MMA . - PLAN: No indication for IV iron at this time. - Continue vitamin B12 to 1000 mcg every other day.   - Repeat labs and RTC in 1 year followed by office visit.  If stable at that time, could consider discharge to PCP. - We will defer management of hemorrhoids to gastroenterology and surgery.  PLAN SUMMARY:  >> Labs in 1 year = CBC/D, BMP, ferritin, iron/TIBC, B12, MMA >> OFFICE visit in 1 year (1 week after labs)     REVIEW OF SYSTEMS:   Review of Systems  Constitutional:  Positive for fatigue. Negative for appetite change, chills, diaphoresis, fever and unexpected weight change.  HENT:   Negative for lump/mass and nosebleeds.   Eyes:  Negative for eye problems.  Respiratory:  Positive for cough and shortness of breath. Negative for hemoptysis.   Cardiovascular:  Positive for chest pain and palpitations. Negative for leg swelling.  Gastrointestinal:  Negative for abdominal pain, blood in stool, constipation,  diarrhea, nausea and vomiting.  Genitourinary:  Positive for frequency. Negative for bladder incontinence and hematuria.   Musculoskeletal:  Positive for arthralgias and back pain. Negative for myalgias and neck pain.  Neurological:  Positive for dizziness, headaches and numbness. Negative for light-headedness.  Hematological:  Does not  bruise/bleed easily.  Psychiatric/Behavioral:  Positive for sleep disturbance. Negative for depression. The patient is nervous/anxious.      PHYSICAL EXAM:  ECOG PERFORMANCE STATUS: 2 - Symptomatic, <50% confined to bed  Vitals:   03/05/24 1350  BP: 130/81  Pulse: 98  Resp: 18  Temp: (!) 97 F (36.1 C)  SpO2: 96%    Physical Exam Constitutional:      Appearance: Normal appearance. She is obese.  Cardiovascular:     Rate and Rhythm: Rhythm irregular.     Heart sounds: Normal heart sounds.  Pulmonary:     Breath sounds: Normal breath sounds.  Neurological:     General: No focal deficit present.     Mental Status: Mental status is at baseline.  Psychiatric:        Behavior: Behavior normal. Behavior is cooperative.     PAST MEDICAL/SURGICAL HISTORY:  Past Medical History:  Diagnosis Date   Anxiety    Arthritis    Atrial fibrillation (HCC)    Bursitis    Left shoulder   Cataract    CHF (congestive heart failure) (HCC)    CKD (chronic kidney disease)    stage 3-4   COPD (chronic obstructive pulmonary disease) (HCC)    Coronary atherosclerosis of native coronary artery    a. s/p DES to LCx in 04/2013 b. cath in 11/2015 showing patent stent with 20% prox-LAD and 80% ostial RCA stenosis for which medical management was recommended due to small artery size   Depression    Diastolic heart failure (HCC)    EF 55-60%   Dysphagia, unspecified(787.20)    Dyspnea    Dysrhythmia    Essential hypertension    GERD (gastroesophageal reflux disease)    Hx Schatzki's ring, multiple EGD/ED last 01/06/2004   Gout    Headache    History of anemia    Hyperlipidemia    Internal hemorrhoids without mention of complication    MI (myocardial infarction) (HCC) 2006   Microscopic colitis 2003   Panic disorder without agoraphobia    Paresthesia    Pneumonia 12/2011   PVD (peripheral vascular disease)    S/P colonoscopy 09/27/2001   internal hemorrhoids, desc colon inflam polyp, SB  BX-chronic duodenitis, colitis   Sleep apnea    Thyroid  disease    Past Surgical History:  Procedure Laterality Date   ABDOMINAL HYSTERECTOMY     ABDOMINAL HYSTERECTOMY     AGILE CAPSULE N/A 08/30/2020   Procedure: AGILE CAPSULE;  Surgeon: Shaaron Lamar HERO, MD;  [facility]: [facility];  Service: Endoscopy;  Laterality: N/A;  7:30am   ANTERIOR AND POSTERIOR REPAIR     with resection of vagina   ANTERIOR LAT LUMBAR FUSION N/A 08/01/2016   Procedure: Lumbar Two-Lumbar Five Transpsoas lateral interbody fusion with Lumbar Two-Three lateral plate fixation;  Surgeon: Morene Hicks Ditty, MD;  Location: [facility] OR;  Service: Neurosurgery;  Laterality: N/A;  L2-5 Transpsoas lateral interbody fusion with L2-3 lateral plate fixation   APPENDECTOMY     BACK SURGERY     BIOPSY  07/05/2015   Procedure: BIOPSY;  Surgeon: Lamar HERO Shaaron, MD;  Location: [facility];  Service: Endoscopy;;  gastric polyp biopsy, ascending colon  biopsy   BIOPSY  02/16/2020   Procedure: BIOPSY;  Surgeon: Shaaron Lamar HERO, MD;  [facility]: [facility];  Service: Endoscopy;;   BIOPSY  06/28/2020   Procedure: BIOPSY;  Surgeon: Shaaron Lamar HERO, MD;  [facility]: [facility];  Service: Endoscopy;;   BLADDER SUSPENSION  11/09/2011   Procedure: TRANSVAGINAL TAPE (TVT) PROCEDURE;  Surgeon: Emery LILLETTE Blaze, MD;  Location: [facility] ORS;  Service: Urology;  Laterality: N/A;   bladder tack  06/2010   BREAST LUMPECTOMY  1998   left, benign   CARDIAC CATHETERIZATION     CARDIAC CATHETERIZATION     CARDIAC CATHETERIZATION N/A 12/16/2015   Procedure: Left Heart Cath and Coronary Angiography;  Surgeon: Debby DELENA Sor, MD;  Location: [facility] INVASIVE CV LAB;  Service: Cardiovascular;  Laterality: N/A;   CARDIOVERSION N/A 10/04/2017   Procedure: CARDIOVERSION;  Surgeon: Charls Pearla DELENA, MD;  Location: [facility] ORS;  Service: Cardiovascular;  Laterality: N/A;   CARDIOVERSION N/A 01/30/2018   Procedure: CARDIOVERSION;  Surgeon: Charls Pearla DELENA, MD;   [facility]: [facility];  Service: Cardiovascular;  Laterality: N/A;   CARDIOVERSION N/A 11/10/2019   Procedure: CARDIOVERSION;  Surgeon: Delford Maude BROCKS, MD;  Location: [facility] ENDOSCOPY;  Service: Cardiovascular;  Laterality: N/A;   CARPAL TUNNEL RELEASE  1989   left   cataract surgery     CHOLECYSTECTOMY  1998   Cholecystectomy     COLONOSCOPY  03/16/2011   multiple hyperplastic colon polyps, sigmoid diverticulosis, melanosis coli   COLONOSCOPY WITH PROPOFOL  N/A 07/05/2015   RMR:one 5 mm polyp in descending colon   COLONOSCOPY WITH PROPOFOL  N/A 06/28/2020   Procedure: COLONOSCOPY WITH PROPOFOL ;  Surgeon: Shaaron Lamar HERO, MD;  [facility];  Service: Endoscopy;  Laterality: N/A;  am appt   CORONARY ANGIOGRAPHY N/A 05/16/2018   Procedure: CORONARY ANGIOGRAPHY (CATH LAB);  Surgeon: Claudene Victory ORN, MD;  Location: [facility] INVASIVE CV LAB;  Service: Cardiovascular;  Laterality: N/A;   CORONARY ANGIOPLASTY WITH STENT PLACEMENT  2015   ESOPHAGEAL DILATION N/A 07/05/2015   Procedure: ESOPHAGEAL DILATION;  Surgeon: Lamar HERO Shaaron, MD;  [facility];  Service: Endoscopy;  Laterality: N/A;   ESOPHAGOGASTRODUODENOSCOPY (EGD) WITH PROPOFOL  N/A 07/05/2015   MFM:[id number]   ESOPHAGOGASTRODUODENOSCOPY (EGD) WITH PROPOFOL  N/A 02/16/2020   Procedure: ESOPHAGOGASTRODUODENOSCOPY (EGD) WITH PROPOFOL ;  Surgeon: Shaaron Lamar HERO, MD;  Normal examined esophagus s/p dilation, erythematous mucosa in the stomach s/p biopsy, normal examined duodenum. Pathology with mild gastropathy, negative for H. Pylori.    GIVENS CAPSULE STUDY N/A 11/10/2020   Procedure: GIVENS CAPSULE STUDY;  Surgeon: Shaaron Lamar HERO, MD;  [facility];  Service: Endoscopy;  Laterality: N/A;  7:30am   JOINT REPLACEMENT Right 2007   right knee   left hand surgery     LEFT HEART CATHETERIZATION WITH CORONARY ANGIOGRAM N/A 05/14/2013   Procedure: LEFT HEART CATHETERIZATION WITH CORONARY ANGIOGRAM;  Surgeon: Ozell JONETTA Fell, MD;   Location: [facility] CATH LAB;  Service: Cardiovascular;  Laterality: N/A;   left rotator cuff surgery     LUMBAR LAMINECTOMY/DECOMPRESSION MICRODISCECTOMY N/A 10/11/2012   Procedure: LUMBAR LAMINECTOMY/DECOMPRESSION MICRODISCECTOMY 2 LEVELS;  Surgeon: Catalina HERO Stains, MD;  Location: [facility]URO [facility];  Service: Neurosurgery;  Laterality: N/A;  L3-4 L4-5 Laminectomy   LUMBAR WOUND DEBRIDEMENT N/A 09/27/2015   Procedure: Exploration of Lumbar Wound w/ Repair CSF Leak/Lumbar Drain Placement;  Surgeon: Catalina Stains, MD;  Location: [facility] NEURO ORS;  Service: Neurosurgery;  Laterality: N/A;   MALONEY DILATION  03/16/2011   Gastritis. No H.pylori on bx. 17F maloney dilation with disruption of  occult cevical esophageal web   MALONEY DILATION N/A 02/16/2020   Procedure: MALONEY DILATION;  Surgeon: Shaaron Lamar HERO, MD;  [facility]: [facility];  Service: Endoscopy;  Laterality: N/A;   NASAL SINUS SURGERY     right knee replacement  2007   right leg benign tumor     SHOULDER SURGERY Left    TEE WITHOUT CARDIOVERSION N/A 10/04/2017   Procedure: TRANSESOPHAGEAL ECHOCARDIOGRAM (TEE) WITH PROPOFOL ;  Surgeon: Charls Pearla LABOR, MD;  Location: [facility];  Service: Cardiovascular;  Laterality: N/A;   TEE WITHOUT CARDIOVERSION N/A 11/10/2019   Procedure: TRANSESOPHAGEAL ECHOCARDIOGRAM (TEE);  Surgeon: Delford Maude BROCKS, MD;  Location: [facility] ENDOSCOPY;  Service: Cardiovascular;  Laterality: N/A;   TONSILLECTOMY     unspecified area, hysterectomy  1972   partial    SOCIAL HISTORY:  Social History   Socioeconomic History   Marital status: Divorced    Spouse name: Not on file   Number of children: 5   Years of education: Not on file   Highest education level: Not on file  Occupational History   Occupation: retired  Tobacco Use   Smoking status: Former    Current packs/day: 0.00    Average packs/day: 1 pack/day for 64.0 years (64.0 ttl pk-yrs)    Types: Cigarettes    Start date: 12/24/1947    Quit date: 11/17/2001    Years  since quitting: 22.3   Smokeless tobacco: Never   Tobacco comments:    Quit smoking in 2003  Vaping Use   Vaping status: Never Used  Substance and Sexual Activity   Alcohol  use: Not Currently   Drug use: No   Sexual activity: Never  Other Topics Concern   Not on file  Social History Narrative   Divorced.   Sister had colon perforation & died from complications in [place], [place]   Social Drivers of Health   Financial Resource Strain: Medium Risk (12/15/2023)   Received from [facility]   Overall Financial Resource Strain (CARDIA)    How hard is it for you to pay for the very basics like food, housing, medical care, and heating?: Somewhat hard  Food Insecurity: No Food Insecurity (12/15/2023)   [facility]   Hunger Vital Sign    Within the past 12 months, you worried that your food would run out before you got the money to buy more.: Never true    Within the past 12 months, the food you bought just didn't last and you didn't have money to get more.: Never true  Transportation Needs: No Transportation Needs (12/15/2023)   Received from [facility] - Transportation    Lack of Transportation (Medical): No    Lack of Transportation (Non-Medical): No  Physical Activity: Inactive (12/15/2023)   Received from [facility]   Exercise Vital Sign    On average, how many days per week do you engage in moderate to strenuous exercise (like a brisk walk)?: 0 days    On average, how many minutes do you engage in exercise at this level?: 0 min  Stress: Stress Concern Present (12/15/2023)   Received from [facility] - Occupational Stress Questionnaire    Do you feel stress - tense, restless, nervous, or anxious, or unable to sleep at night because your mind is troubled all the time - these  days?: Very much  Social Connections: Socially Isolated (12/15/2023)   Received from [facility]   Social Connection and  Isolation Panel    In a typical week, how many times do you talk on the phone with family, friends, or neighbors?: More than three times a week    How often do you get together with friends or relatives?: More than three times a week    How often do you attend church or religious services?: Never    Do you belong to any clubs or organizations such as church groups, unions, fraternal or athletic groups, or school groups?: No    How often do you attend meetings of the clubs or organizations you belong to?: Never    Are you married, widowed, divorced, separated, never married, or living with a partner?: Divorced  Intimate Partner Violence: Not At Risk (12/15/2023)   Received from [facility]   Humiliation, Afraid, Rape, and Kick questionnaire    Within the last year, have you been afraid of your partner or ex-partner?: No    Within the last year, have you been humiliated or emotionally abused in other ways by your partner or ex-partner?: No    Within the last year, have you been kicked, hit, slapped, or otherwise physically hurt by your partner or ex-partner?: No    Within the last year, have you been raped or forced to have any kind of sexual activity by your partner or ex-partner?: No    FAMILY HISTORY:  Family History  Problem Relation Age of Onset   Stroke Mother    Parkinson's disease Father    Coronary artery disease Other        family Hx-sons   Cancer Other    Stroke Other        family Hx   Hypertension Other        family Hx   Diabetes Brother    Heart disease Son        before age 51   Diabetes Son    Stroke Daughter 34   Colon cancer Grandson        diagnosed 40   Inflammatory bowel disease Neg Hx     CURRENT MEDICATIONS:  Outpatient Encounter Medications as of 03/05/2024  Medication Sig Note   acetaminophen  (TYLENOL ) 500 MG tablet Take 500 mg by mouth every 6 (six) hours as needed for headache or moderate pain.    ALPRAZolam  (XANAX ) 0.5 MG tablet Take 0.5 mg by  mouth daily as needed for anxiety or sleep.    apixaban  (ELIQUIS ) 5 MG TABS tablet Take 1 tablet (5 mg total) by mouth 2 (two) times daily.    ascorbic acid  (VITAMIN C) 500 MG tablet Take 1 tablet (500 mg total) by mouth daily.    cyanocobalamin  (VITAMIN B12) 1000 MCG tablet Take 1 tablet (1,000 mcg total) by mouth every other day.    dicyclomine (BENTYL) 10 MG capsule Take 1 capsule (10 mg total) by mouth daily as needed for spasms (Severe abdominal cramping).    diltiazem  (CARDIZEM  CD) 180 MG 24 hr capsule Take 1 capsule (180 mg total) by mouth daily before breakfast.    DULoxetine  (CYMBALTA ) 60 MG capsule Take 60 mg by mouth daily.    estradiol  (ESTRACE ) 0.1 MG/GM vaginal cream Discard plastic applicator. Insert a blueberry size amount (approximately 1 gram) of cream on fingertip inside vagina at bedtime every night for 1 week then 2 nights per week for long term use.  fluticasone  (CUTIVATE ) 0.05 % cream Apply 1 application topically 2 (two) times daily as needed (skin tears).    furosemide  (LASIX ) 40 MG tablet Take 1 tablet (40 mg total) by mouth daily.    gentamicin (GARAMYCIN) 0.3 % ophthalmic solution 1 drop 2 (two) times daily.    HYDROcodone -acetaminophen  (NORCO/VICODIN) 5-325 MG tablet Take 1 tablet by mouth every 6 (six) hours as needed.    hydrocortisone  (ANUSOL -HC) 2.5 % rectal cream Place 1 application rectally 2 (two) times daily.    iron polysaccharides (NIFEREX) 150 MG capsule Take 150 mg by mouth. 12/18/2023: ON discontinue list on DC Summary as of 12/18/23 partial review with daughter Devere but call was cut short.    isosorbide  mononitrate (IMDUR ) 30 MG 24 hr tablet Take 1 tablet (30 mg total) by mouth daily.    lipase/protease/amylase (CREON) 36000 UNITS CPEP capsule Take 1 capsule (36,000 Units total) by mouth 3 (three) times daily with meals. May also take 1 capsule (36,000 Units total) as needed (with snacks - up to 4 snacks daily).    magnesium  oxide (MAG-OX) 400 (240 Mg) MG  tablet Take 1 tablet by mouth daily.    metoprolol  succinate (TOPROL -XL) 50 MG 24 hr tablet Take 1 tablet (50 mg total) by mouth 2 (two) times daily. Take with or immediately following a meal. 12/18/2023: This medication was on CONTINUE TAKING Medications on DC Summary on 12/17/23.    Multiple Vitamins-Minerals (HAIR/SKIN/NAILS) CAPS Take 1 tablet by mouth daily.    Naphazoline HCl (CLEAR EYES OP) Place 1 drop into both eyes daily as needed (itching).    nitroGLYCERIN  (NITROSTAT ) 0.4 MG SL tablet DISSOLVE ONE TABLET UNDER TONGUE EVERY 5 MINUTES UP TO 3 DOSES AS NEEDED FOR CHEST PAIN.    ondansetron  (ZOFRAN -ODT) 4 MG disintegrating tablet Take 4 mg by mouth every 8 (eight) hours as needed.    pantoprazole  (PROTONIX ) 40 MG tablet TAKE ONE TABLET BY MOUTH TWICE DAILY BEFORE MEALS    potassium chloride  (KLOR-CON ) 10 MEQ tablet Take 2 tablets (20 mEq total) by mouth 2 (two) times daily. (Patient taking differently: Take 20 mEq by mouth once.) 12/18/2023: Taking per partial medication review on 12/18/23 with Daughter, Devere as informant.    traMADol  (ULTRAM ) 50 MG tablet Take 1 tablet (50 mg total) by mouth every 6 (six) hours as needed.    TRELEGY ELLIPTA 100-62.5-25 MCG/ACT AEPB Take 1 puff by mouth daily.    triamcinolone cream (KENALOG) 0.1 % Apply topically 2 (two) times daily.    zinc  sulfate 220 (50 Zn) MG capsule Take 1 capsule (220 mg total) by mouth daily.    [DISCONTINUED] cyclobenzaprine  (FLEXERIL ) 5 MG tablet Take 5 mg by mouth at bedtime. (Patient not taking: Reported on 01/16/2024)    [DISCONTINUED] diltiazem  (TIAZAC ) 120 MG 24 hr capsule Take 1 capsule (120 mg total) by mouth at bedtime. (Patient not taking: Reported on 01/16/2024) 12/18/2023: This looks like DUPLICATE   [DISCONTINUED] potassium chloride  (KLOR-CON  M) 10 MEQ tablet Take 1 tablet by mouth daily. (Patient not taking: Reported on 02/28/2024)    No facility-administered encounter medications on file as of 03/05/2024.    ALLERGIES:   Allergies  Allergen Reactions   Cephalosporins Diarrhea and Nausea Only    Lightheaded   Levaquin [Levofloxacin In D5w] Swelling   Macrodantin [Nitrofurantoin Macrocrystal] Swelling   Phenothiazines Anaphylaxis and Hives   Polysorbate Anaphylaxis   Prednisone  Shortness Of Breath   Buspirone Itching   Cardura [Doxazosin Mesylate] Itching   Codeine Itching  Acyclovir And Related Itching    Redness of skin   Colcrys  [Colchicine ] Nausea Only    Upset stomach    Prochlorperazine Other (See Comments)    Upset stomach   Ranexa  [Ranolazine ]     Severe drop in BP   Sulfa  Antibiotics Other (See Comments)    Severe blisters   Atorvastatin Hives    Cramping; tolerates Crestor  ok   Colestipol  Palpitations   Ofloxacin Rash   Other Itching and Rash    WOOL= make skin look like it has been burned   Penicillins Other (See Comments)    Causes redness all over. Has patient had a PCN reaction causing immediate rash, facial/tongue/throat swelling, SOB or lightheadedness with hypotension: No Has patient had a PCN reaction causing severe rash involving mucus membranes or skin necrosis: No Has patient had a PCN reaction that required hospitalization No Has patient had a PCN reaction occurring within the last 10 years: No If all of the above answers are NO, then may proceed with Cephalosporin use.    Pimozide Hives and Itching   Zetia  [Ezetimibe ] Itching and Rash    LABORATORY DATA:  I have reviewed the labs as listed.  CBC    Component Value Date/Time   WBC 9.4 02/27/2024 1407   RBC 4.45 02/27/2024 1407   HGB 13.5 02/27/2024 1407   HCT 40.2 02/27/2024 1407   PLT 295 02/27/2024 1407   MCV 90.3 02/27/2024 1407   MCH 30.3 02/27/2024 1407   MCHC 33.6 02/27/2024 1407   RDW 14.6 02/27/2024 1407   LYMPHSABS 2.8 02/27/2024 1407   MONOABS 0.6 02/27/2024 1407   EOSABS 0.3 02/27/2024 1407   BASOSABS 0.1 02/27/2024 1407      Latest Ref Rng & Units 02/27/2024    2:07 PM 02/06/2024    12:09 PM 01/18/2024   11:26 AM  CMP  Glucose 70 - 99 mg/dL 833   826   BUN 8 - 23 mg/dL 28   28   Creatinine 9.55 - 1.00 mg/dL 8.68   8.78   Sodium 864 - 145 mmol/L 137   137   Potassium 3.5 - 5.1 mmol/L 4.3   4.2   Chloride 98 - 111 mmol/L 98   96   CO2 22 - 32 mmol/L 25   29   Calcium  8.9 - 10.3 mg/dL 9.7   89.6   Total Protein 6.5 - 8.1 g/dL   7.9   Total Bilirubin 0.0 - 1.2 mg/dL   0.4   Alkaline Phos 38 - 126 U/L  183  661   AST 15 - 41 U/L   76   ALT 0 - 44 U/L   66     DIAGNOSTIC IMAGING:  I have independently reviewed the relevant imaging and discussed with the patient.   WRAP UP:  All questions were answered. The patient knows to call the clinic with any problems, questions or concerns.  Medical decision making: Moderate  Time spent on visit: I spent 20 minutes counseling the patient face to face. The total time spent in the appointment was 30 minutes and more than 50% was on counseling.  Pleasant CHRISTELLA Barefoot, PA-C  03/05/24 2:36 PM

## 2024-03-05 ENCOUNTER — Inpatient Hospital Stay: Admitting: Physician Assistant

## 2024-03-05 ENCOUNTER — Encounter: Payer: Self-pay | Admitting: Physician Assistant

## 2024-03-05 DIAGNOSIS — E538 Deficiency of other specified B group vitamins: Secondary | ICD-10-CM

## 2024-03-05 DIAGNOSIS — D500 Iron deficiency anemia secondary to blood loss (chronic): Secondary | ICD-10-CM

## 2024-03-05 NOTE — Patient Instructions (Signed)
 [facility] at [facility] Discharge Instructions  You were seen today by Pleasant Barefoot PA-C for your iron deficiency anemia.  Your blood and iron levels look great!  You do not need any IV iron at this time.  Your vitamin B12 levels look great.  You can continue your vitamin B12 (cyanocobalamin ) 1000 mcg every other day.    We will check your labs and see you again in 1 year to see if you need more IV iron at that time.  FOLLOW-UP APPOINTMENT: Office visit in 1 year, after labs  ** Thank you for trusting me with your healthcare!  I strive to provide all of my patients with quality care at each visit.  If you receive a survey for this visit, I would be so grateful to you for taking the time to provide feedback.  Thank you in advance!  ~ Leslee Haueter                                        Dr. Mickiel Davonna Pleasant Barefoot, PA-C       Delon Hope, NP   - - - - - - - - - - - - - - - - - -     Thank you for choosing [facility] at [facility] to provide your oncology and hematology care.  To afford each patient quality time with our provider, please arrive at least 15 minutes before your scheduled appointment time.   If you have a lab appointment with the Cancer Center please come in thru the Main Entrance and check in at the main information desk.  You need to re-schedule your appointment should you arrive 10 or more minutes late.  We strive to give you quality time with our providers, and arriving late affects you and other patients whose appointments are after yours.  Also, if you no show three or more times for appointments you may be dismissed from the clinic at the providers discretion.     Again, thank you for choosing [facility].  Our hope is that these requests will decrease the amount of time that you wait before being seen by our physicians.        _____________________________________________________________  Should you have questions after your visit to [facility], please contact our office at [phone] and follow the prompts.  Our office hours are 8:00 a.m. and 4:30 p.m. Monday - Friday.  Please note that voicemails left after 4:00 p.m. may not be returned until the following business day.  We are closed weekends and major holidays.  You do have access to a nurse 24-7, just call the main number to the clinic [phone] and do not press any options, hold on the line and a nurse will answer the phone.    For prescription refill requests, have your pharmacy contact our office and allow 72 hours.    Due to Covid, you will need to wear a mask upon entering the hospital. If you do not have a mask, a mask will be given to you at the Main Entrance upon arrival. For doctor visits, patients may have 1 support person age 56 or older with them. For treatment visits, patients can not have anyone with them due to social distancing guidelines and our immunocompromised population.

## 2024-03-06 LAB — METHYLMALONIC ACID, SERUM: Methylmalonic Acid, Quantitative: 348 nmol/L (ref 0–378)

## 2024-03-07 ENCOUNTER — Other Ambulatory Visit: Payer: Self-pay | Admitting: Gastroenterology

## 2024-04-14 ENCOUNTER — Encounter: Payer: Self-pay | Admitting: *Deleted

## 2024-05-22 ENCOUNTER — Other Ambulatory Visit: Payer: Self-pay | Admitting: *Deleted

## 2024-05-22 DIAGNOSIS — R748 Abnormal levels of other serum enzymes: Secondary | ICD-10-CM

## 2024-05-22 DIAGNOSIS — R7989 Other specified abnormal findings of blood chemistry: Secondary | ICD-10-CM

## 2024-05-22 DIAGNOSIS — D649 Anemia, unspecified: Secondary | ICD-10-CM

## 2025-02-25 ENCOUNTER — Inpatient Hospital Stay

## 2025-03-04 ENCOUNTER — Inpatient Hospital Stay: Admitting: Physician Assistant
